# Patient Record
Sex: Male | Born: 1955
Health system: Southern US, Community
[De-identification: ages and names within clinical notes are randomized; demographics above are authoritative.]

## PROBLEM LIST (undated history)

## (undated) DIAGNOSIS — N189 Chronic kidney disease, unspecified: Secondary | ICD-10-CM

## (undated) DIAGNOSIS — M1990 Unspecified osteoarthritis, unspecified site: Secondary | ICD-10-CM

## (undated) DIAGNOSIS — R296 Repeated falls: Secondary | ICD-10-CM

## (undated) DIAGNOSIS — E119 Type 2 diabetes mellitus without complications: Secondary | ICD-10-CM

## (undated) DIAGNOSIS — D849 Immunodeficiency, unspecified: Secondary | ICD-10-CM

## (undated) DIAGNOSIS — Z794 Long term (current) use of insulin: Secondary | ICD-10-CM

## (undated) DIAGNOSIS — K219 Gastro-esophageal reflux disease without esophagitis: Secondary | ICD-10-CM

## (undated) DIAGNOSIS — I160 Hypertensive urgency: Secondary | ICD-10-CM

## (undated) DIAGNOSIS — I639 Cerebral infarction, unspecified: Secondary | ICD-10-CM

## (undated) DIAGNOSIS — B20 Human immunodeficiency virus [HIV] disease: Secondary | ICD-10-CM

## (undated) DIAGNOSIS — M5126 Other intervertebral disc displacement, lumbar region: Secondary | ICD-10-CM

## (undated) DIAGNOSIS — Z21 Asymptomatic human immunodeficiency virus [HIV] infection status: Secondary | ICD-10-CM

## (undated) DIAGNOSIS — G40909 Epilepsy, unspecified, not intractable, without status epilepticus: Secondary | ICD-10-CM

## (undated) DIAGNOSIS — E1140 Type 2 diabetes mellitus with diabetic neuropathy, unspecified: Secondary | ICD-10-CM

## (undated) DIAGNOSIS — I10 Essential (primary) hypertension: Secondary | ICD-10-CM

## (undated) DIAGNOSIS — G43909 Migraine, unspecified, not intractable, without status migrainosus: Secondary | ICD-10-CM

## (undated) DIAGNOSIS — G0490 Encephalitis and encephalomyelitis, unspecified: Secondary | ICD-10-CM

## (undated) HISTORY — DX: Type 2 diabetes mellitus without complications: E11.9

## (undated) HISTORY — PX: AV FISTULA PLACEMENT: SHX1204

## (undated) HISTORY — PX: IR FLUORO GUIDE CV LINE RIGHT: IMG2283

## (undated) HISTORY — DX: Encephalitis and encephalomyelitis, unspecified: G04.90

## (undated) HISTORY — DX: Type 2 diabetes mellitus without complications: Z79.4

## (undated) HISTORY — PX: IR US GUIDE VASC ACCESS RIGHT: IMG2390

## (undated) HISTORY — DX: Hypertensive urgency: I16.0

## (undated) HISTORY — PX: IR REMOVAL TUN CV CATH W/O FL: IMG2289

## (undated) HISTORY — DX: Type 2 diabetes mellitus with diabetic neuropathy, unspecified: E11.40

## (undated) HISTORY — PX: KNEE ARTHROSCOPY: SHX127

---

## 1998-03-28 ENCOUNTER — Emergency Department (HOSPITAL_COMMUNITY): Admission: EM | Admit: 1998-03-28 | Discharge: 1998-03-28 | Payer: Self-pay | Admitting: Emergency Medicine

## 1998-05-09 ENCOUNTER — Observation Stay (HOSPITAL_COMMUNITY): Admission: EM | Admit: 1998-05-09 | Discharge: 1998-05-10 | Payer: Self-pay | Admitting: Emergency Medicine

## 1999-02-13 ENCOUNTER — Emergency Department (HOSPITAL_COMMUNITY): Admission: EM | Admit: 1999-02-13 | Discharge: 1999-02-13 | Payer: Self-pay | Admitting: Emergency Medicine

## 1999-04-05 ENCOUNTER — Emergency Department (HOSPITAL_COMMUNITY): Admission: EM | Admit: 1999-04-05 | Discharge: 1999-04-05 | Payer: Self-pay | Admitting: Emergency Medicine

## 1999-04-24 ENCOUNTER — Emergency Department (HOSPITAL_COMMUNITY): Admission: EM | Admit: 1999-04-24 | Discharge: 1999-04-24 | Payer: Self-pay | Admitting: Emergency Medicine

## 1999-04-30 ENCOUNTER — Emergency Department (HOSPITAL_COMMUNITY): Admission: EM | Admit: 1999-04-30 | Discharge: 1999-04-30 | Payer: Self-pay | Admitting: *Deleted

## 1999-06-26 ENCOUNTER — Inpatient Hospital Stay (HOSPITAL_COMMUNITY): Admission: AD | Admit: 1999-06-26 | Discharge: 1999-06-28 | Payer: Self-pay | Admitting: Internal Medicine

## 1999-08-21 ENCOUNTER — Emergency Department (HOSPITAL_COMMUNITY): Admission: EM | Admit: 1999-08-21 | Discharge: 1999-08-21 | Payer: Self-pay | Admitting: Emergency Medicine

## 1999-10-30 ENCOUNTER — Emergency Department (HOSPITAL_COMMUNITY): Admission: EM | Admit: 1999-10-30 | Discharge: 1999-10-30 | Payer: Self-pay | Admitting: Emergency Medicine

## 1999-12-30 ENCOUNTER — Inpatient Hospital Stay (HOSPITAL_COMMUNITY): Admission: EM | Admit: 1999-12-30 | Discharge: 1999-12-31 | Payer: Self-pay | Admitting: *Deleted

## 2000-04-02 ENCOUNTER — Encounter: Payer: Self-pay | Admitting: Internal Medicine

## 2000-04-02 ENCOUNTER — Emergency Department (HOSPITAL_COMMUNITY): Admission: EM | Admit: 2000-04-02 | Discharge: 2000-04-02 | Payer: Self-pay | Admitting: Internal Medicine

## 2000-05-12 ENCOUNTER — Emergency Department (HOSPITAL_COMMUNITY): Admission: EM | Admit: 2000-05-12 | Discharge: 2000-05-12 | Payer: Self-pay | Admitting: Emergency Medicine

## 2000-07-06 ENCOUNTER — Emergency Department (HOSPITAL_COMMUNITY): Admission: EM | Admit: 2000-07-06 | Discharge: 2000-07-06 | Payer: Self-pay | Admitting: Emergency Medicine

## 2000-12-22 ENCOUNTER — Emergency Department (HOSPITAL_COMMUNITY): Admission: EM | Admit: 2000-12-22 | Discharge: 2000-12-22 | Payer: Self-pay | Admitting: Emergency Medicine

## 2000-12-22 ENCOUNTER — Encounter: Payer: Self-pay | Admitting: Emergency Medicine

## 2001-06-09 ENCOUNTER — Emergency Department (HOSPITAL_COMMUNITY): Admission: EM | Admit: 2001-06-09 | Discharge: 2001-06-09 | Payer: Self-pay | Admitting: Emergency Medicine

## 2001-06-09 ENCOUNTER — Encounter: Payer: Self-pay | Admitting: Emergency Medicine

## 2001-06-13 ENCOUNTER — Emergency Department (HOSPITAL_COMMUNITY): Admission: EM | Admit: 2001-06-13 | Discharge: 2001-06-13 | Payer: Self-pay

## 2001-08-17 ENCOUNTER — Emergency Department (HOSPITAL_COMMUNITY): Admission: EM | Admit: 2001-08-17 | Discharge: 2001-08-17 | Payer: Self-pay | Admitting: Emergency Medicine

## 2001-08-18 ENCOUNTER — Encounter: Payer: Self-pay | Admitting: Emergency Medicine

## 2001-08-18 ENCOUNTER — Emergency Department (HOSPITAL_COMMUNITY): Admission: EM | Admit: 2001-08-18 | Discharge: 2001-08-18 | Payer: Self-pay | Admitting: Emergency Medicine

## 2001-09-17 ENCOUNTER — Emergency Department (HOSPITAL_COMMUNITY): Admission: EM | Admit: 2001-09-17 | Discharge: 2001-09-17 | Payer: Self-pay | Admitting: Emergency Medicine

## 2001-09-24 ENCOUNTER — Emergency Department (HOSPITAL_COMMUNITY): Admission: EM | Admit: 2001-09-24 | Discharge: 2001-09-24 | Payer: Self-pay | Admitting: Emergency Medicine

## 2002-01-09 ENCOUNTER — Emergency Department (HOSPITAL_COMMUNITY): Admission: EM | Admit: 2002-01-09 | Discharge: 2002-01-09 | Payer: Self-pay | Admitting: Emergency Medicine

## 2002-02-07 ENCOUNTER — Emergency Department (HOSPITAL_COMMUNITY): Admission: EM | Admit: 2002-02-07 | Discharge: 2002-02-07 | Payer: Self-pay | Admitting: Emergency Medicine

## 2002-02-16 ENCOUNTER — Encounter: Payer: Self-pay | Admitting: Emergency Medicine

## 2002-02-16 ENCOUNTER — Emergency Department (HOSPITAL_COMMUNITY): Admission: EM | Admit: 2002-02-16 | Discharge: 2002-02-16 | Payer: Self-pay | Admitting: Emergency Medicine

## 2002-06-26 ENCOUNTER — Encounter: Payer: Self-pay | Admitting: *Deleted

## 2002-06-26 ENCOUNTER — Emergency Department (HOSPITAL_COMMUNITY): Admission: EM | Admit: 2002-06-26 | Discharge: 2002-06-26 | Payer: Self-pay | Admitting: Emergency Medicine

## 2002-09-29 ENCOUNTER — Emergency Department (HOSPITAL_COMMUNITY): Admission: EM | Admit: 2002-09-29 | Discharge: 2002-09-30 | Payer: Self-pay | Admitting: Emergency Medicine

## 2002-10-12 ENCOUNTER — Encounter: Payer: Self-pay | Admitting: Emergency Medicine

## 2002-10-12 ENCOUNTER — Emergency Department (HOSPITAL_COMMUNITY): Admission: EM | Admit: 2002-10-12 | Discharge: 2002-10-12 | Payer: Self-pay | Admitting: Emergency Medicine

## 2002-12-10 ENCOUNTER — Encounter: Payer: Self-pay | Admitting: Emergency Medicine

## 2002-12-10 ENCOUNTER — Emergency Department (HOSPITAL_COMMUNITY): Admission: EM | Admit: 2002-12-10 | Discharge: 2002-12-10 | Payer: Self-pay | Admitting: Emergency Medicine

## 2003-06-29 ENCOUNTER — Emergency Department (HOSPITAL_COMMUNITY): Admission: EM | Admit: 2003-06-29 | Discharge: 2003-06-29 | Payer: Self-pay | Admitting: Emergency Medicine

## 2003-06-29 ENCOUNTER — Encounter: Payer: Self-pay | Admitting: Emergency Medicine

## 2003-09-21 ENCOUNTER — Ambulatory Visit (HOSPITAL_COMMUNITY): Admission: RE | Admit: 2003-09-21 | Discharge: 2003-09-21 | Payer: Self-pay | Admitting: Internal Medicine

## 2003-10-05 ENCOUNTER — Ambulatory Visit (HOSPITAL_COMMUNITY): Admission: RE | Admit: 2003-10-05 | Discharge: 2003-10-05 | Payer: Self-pay | Admitting: Family Medicine

## 2004-02-13 ENCOUNTER — Emergency Department (HOSPITAL_COMMUNITY): Admission: EM | Admit: 2004-02-13 | Discharge: 2004-02-13 | Payer: Self-pay | Admitting: *Deleted

## 2004-05-17 ENCOUNTER — Emergency Department (HOSPITAL_COMMUNITY): Admission: EM | Admit: 2004-05-17 | Discharge: 2004-05-18 | Payer: Self-pay | Admitting: Emergency Medicine

## 2004-06-09 ENCOUNTER — Emergency Department (HOSPITAL_COMMUNITY): Admission: EM | Admit: 2004-06-09 | Discharge: 2004-06-09 | Payer: Self-pay | Admitting: Emergency Medicine

## 2004-10-21 ENCOUNTER — Ambulatory Visit: Payer: Self-pay | Admitting: Family Medicine

## 2004-10-21 ENCOUNTER — Ambulatory Visit: Payer: Self-pay | Admitting: *Deleted

## 2007-11-04 ENCOUNTER — Encounter (INDEPENDENT_AMBULATORY_CARE_PROVIDER_SITE_OTHER): Payer: Self-pay | Admitting: Family Medicine

## 2009-04-09 ENCOUNTER — Emergency Department (HOSPITAL_COMMUNITY): Admission: EM | Admit: 2009-04-09 | Discharge: 2009-04-10 | Payer: Self-pay | Admitting: Emergency Medicine

## 2009-04-13 ENCOUNTER — Emergency Department (HOSPITAL_COMMUNITY): Admission: EM | Admit: 2009-04-13 | Discharge: 2009-04-14 | Payer: Self-pay | Admitting: Emergency Medicine

## 2009-06-18 ENCOUNTER — Emergency Department (HOSPITAL_COMMUNITY): Admission: EM | Admit: 2009-06-18 | Discharge: 2009-06-18 | Payer: Self-pay | Admitting: Emergency Medicine

## 2009-09-19 ENCOUNTER — Emergency Department (HOSPITAL_BASED_OUTPATIENT_CLINIC_OR_DEPARTMENT_OTHER): Admission: EM | Admit: 2009-09-19 | Discharge: 2009-09-19 | Payer: Self-pay | Admitting: Emergency Medicine

## 2009-10-28 ENCOUNTER — Emergency Department (HOSPITAL_BASED_OUTPATIENT_CLINIC_OR_DEPARTMENT_OTHER): Admission: EM | Admit: 2009-10-28 | Discharge: 2009-10-29 | Payer: Self-pay | Admitting: Emergency Medicine

## 2009-10-29 ENCOUNTER — Ambulatory Visit: Payer: Self-pay | Admitting: Diagnostic Radiology

## 2009-11-20 ENCOUNTER — Emergency Department (HOSPITAL_COMMUNITY): Admission: EM | Admit: 2009-11-20 | Discharge: 2009-11-21 | Payer: Self-pay | Admitting: Emergency Medicine

## 2010-01-06 ENCOUNTER — Emergency Department (HOSPITAL_COMMUNITY): Admission: EM | Admit: 2010-01-06 | Discharge: 2010-01-07 | Payer: Self-pay | Admitting: Emergency Medicine

## 2010-02-22 ENCOUNTER — Emergency Department (HOSPITAL_COMMUNITY): Admission: EM | Admit: 2010-02-22 | Discharge: 2010-02-22 | Payer: Self-pay | Admitting: Emergency Medicine

## 2010-03-08 ENCOUNTER — Emergency Department (HOSPITAL_BASED_OUTPATIENT_CLINIC_OR_DEPARTMENT_OTHER): Admission: EM | Admit: 2010-03-08 | Discharge: 2010-03-08 | Payer: Self-pay | Admitting: Emergency Medicine

## 2010-04-08 ENCOUNTER — Emergency Department (HOSPITAL_COMMUNITY): Admission: EM | Admit: 2010-04-08 | Discharge: 2010-04-08 | Payer: Self-pay | Admitting: Emergency Medicine

## 2010-04-09 ENCOUNTER — Emergency Department (HOSPITAL_COMMUNITY): Admission: EM | Admit: 2010-04-09 | Discharge: 2010-04-09 | Payer: Self-pay | Admitting: Emergency Medicine

## 2010-08-01 ENCOUNTER — Ambulatory Visit: Payer: Self-pay | Admitting: Diagnostic Radiology

## 2010-08-01 ENCOUNTER — Emergency Department (HOSPITAL_BASED_OUTPATIENT_CLINIC_OR_DEPARTMENT_OTHER): Admission: EM | Admit: 2010-08-01 | Discharge: 2010-08-01 | Payer: Self-pay | Admitting: Emergency Medicine

## 2010-08-12 ENCOUNTER — Emergency Department (HOSPITAL_BASED_OUTPATIENT_CLINIC_OR_DEPARTMENT_OTHER): Admission: EM | Admit: 2010-08-12 | Discharge: 2010-08-12 | Payer: Self-pay | Admitting: Emergency Medicine

## 2010-12-03 LAB — DIFFERENTIAL
Basophils Absolute: 0.2 10*3/uL — ABNORMAL HIGH (ref 0.0–0.1)
Eosinophils Relative: 2 % (ref 0–5)
Lymphocytes Relative: 25 % (ref 12–46)
Lymphs Abs: 1.9 10*3/uL (ref 0.7–4.0)
Neutro Abs: 4.7 10*3/uL (ref 1.7–7.7)

## 2010-12-03 LAB — POCT CARDIAC MARKERS
CKMB, poc: <1 ng/mL — ABNORMAL LOW (ref 1.0–8.0)
Myoglobin, poc: 75.8 ng/mL (ref 12–200)
Troponin i, poc: <0.05 ng/mL (ref 0.00–0.09)

## 2010-12-03 LAB — COMPREHENSIVE METABOLIC PANEL
AST: 23 U/L (ref 0–37)
Albumin: 5 g/dL (ref 3.5–5.2)
BUN: 11 mg/dL (ref 6–23)
CO2: 29 mEq/L (ref 19–32)
Calcium: 10.2 mg/dL (ref 8.4–10.5)
Creatinine, Ser: 1.1 mg/dL (ref 0.4–1.5)
GFR calc Af Amer: >60 mL/min (ref >60)
GFR calc non Af Amer: >60 mL/min (ref >60)
Total Bilirubin: 0.6 mg/dL (ref 0.3–1.2)

## 2010-12-03 LAB — CBC
MCH: 32.2 pg (ref 26.0–34.0)
MCHC: 34.8 g/dL (ref 30.0–36.0)
MCV: 92.7 fL (ref 78.0–100.0)
Platelets: 230 10*3/uL (ref 150–400)

## 2010-12-03 LAB — LIPASE, BLOOD: Lipase: 62 U/L (ref 23–300)

## 2010-12-09 LAB — COMPREHENSIVE METABOLIC PANEL
Albumin: 3.7 g/dL (ref 3.5–5.2)
BUN: 12 mg/dL (ref 6–23)
Calcium: 9.2 mg/dL (ref 8.4–10.5)
Creatinine, Ser: 1.18 mg/dL (ref 0.4–1.5)
Glucose, Bld: 211 mg/dL — ABNORMAL HIGH (ref 70–99)
Potassium: 3.6 mEq/L (ref 3.5–5.1)
Total Protein: 7.1 g/dL (ref 6.0–8.3)

## 2010-12-10 LAB — CBC
HCT: 42.4 % (ref 39.0–52.0)
Hemoglobin: 14.8 g/dL (ref 13.0–17.0)
MCHC: 34.9 g/dL (ref 30.0–36.0)
MCV: 91.3 fL (ref 78.0–100.0)
RBC: 4.64 MIL/uL (ref 4.22–5.81)
RDW: 12.5 % (ref 11.5–15.5)

## 2010-12-10 LAB — GLUCOSE, CAPILLARY: Glucose-Capillary: 294 mg/dL — ABNORMAL HIGH (ref 70–99)

## 2010-12-10 LAB — DIFFERENTIAL
Basophils Relative: 0 % (ref 0–1)
Eosinophils Relative: 1 % (ref 0–5)
Monocytes Absolute: 0.3 10*3/uL (ref 0.1–1.0)
Monocytes Relative: 4 % (ref 3–12)
Neutro Abs: 4.8 10*3/uL (ref 1.7–7.7)

## 2010-12-10 LAB — URINALYSIS, ROUTINE W REFLEX MICROSCOPIC
Ketones, ur: NEGATIVE mg/dL
Specific Gravity, Urine: 1.046 — ABNORMAL HIGH (ref 1.005–1.030)
Urobilinogen, UA: 1 mg/dL (ref 0.0–1.0)

## 2010-12-10 LAB — URINE MICROSCOPIC-ADD ON

## 2010-12-10 LAB — BASIC METABOLIC PANEL
CO2: 30 mEq/L (ref 19–32)
Calcium: 9.1 mg/dL (ref 8.4–10.5)
Chloride: 100 mEq/L (ref 96–112)
GFR calc Af Amer: >60 mL/min (ref >60)
Glucose, Bld: 287 mg/dL — ABNORMAL HIGH (ref 70–99)
Potassium: 3 mEq/L — ABNORMAL LOW (ref 3.5–5.1)
Sodium: 136 mEq/L (ref 135–145)

## 2010-12-12 LAB — DIFFERENTIAL
Basophils Absolute: 0.2 10*3/uL — ABNORMAL HIGH (ref 0.0–0.1)
Basophils Relative: 3 % — ABNORMAL HIGH (ref 0–1)
Eosinophils Absolute: 0.1 10*3/uL (ref 0.0–0.7)
Neutro Abs: 4.2 10*3/uL (ref 1.7–7.7)
Neutrophils Relative %: 51 % (ref 43–77)

## 2010-12-12 LAB — BASIC METABOLIC PANEL
Calcium: 8.3 mg/dL — ABNORMAL LOW (ref 8.4–10.5)
GFR calc Af Amer: >60 mL/min (ref >60)
GFR calc non Af Amer: >60 mL/min (ref >60)
Sodium: 139 mEq/L (ref 135–145)

## 2010-12-12 LAB — CBC
MCHC: 35.3 g/dL (ref 30.0–36.0)
MCV: 90.9 fL (ref 78.0–100.0)
Platelets: 209 10*3/uL (ref 150–400)

## 2010-12-16 LAB — LIPASE, BLOOD: Lipase: 33 U/L (ref 11–59)

## 2010-12-16 LAB — COMPREHENSIVE METABOLIC PANEL
ALT: 11 U/L (ref 0–53)
Alkaline Phosphatase: 92 U/L (ref 39–117)
CO2: 33 mEq/L — ABNORMAL HIGH (ref 19–32)
Chloride: 96 mEq/L (ref 96–112)
Glucose, Bld: 204 mg/dL — ABNORMAL HIGH (ref 70–99)
Potassium: 2.7 mEq/L — CL (ref 3.5–5.1)
Sodium: 136 mEq/L (ref 135–145)
Total Protein: 6.8 g/dL (ref 6.0–8.3)

## 2010-12-16 LAB — CBC
Hemoglobin: 13.7 g/dL (ref 13.0–17.0)
RBC: 4.38 MIL/uL (ref 4.22–5.81)
RDW: 13.7 % (ref 11.5–15.5)
WBC: 8.4 10*3/uL (ref 4.0–10.5)

## 2010-12-16 LAB — URINALYSIS, ROUTINE W REFLEX MICROSCOPIC
Bilirubin Urine: NEGATIVE
Glucose, UA: 100 mg/dL — AB
Ketones, ur: NEGATIVE mg/dL
pH: 7 (ref 5.0–8.0)

## 2010-12-16 LAB — DIFFERENTIAL
Basophils Relative: 0 % (ref 0–1)
Eosinophils Absolute: 0.1 10*3/uL (ref 0.0–0.7)
Monocytes Absolute: 0.4 10*3/uL (ref 0.1–1.0)
Neutrophils Relative %: 66 % (ref 43–77)

## 2010-12-23 LAB — COMPREHENSIVE METABOLIC PANEL
CO2: 29 mEq/L (ref 19–32)
Calcium: 9.5 mg/dL (ref 8.4–10.5)
Creatinine, Ser: 1.1 mg/dL (ref 0.4–1.5)
GFR calc non Af Amer: >60 mL/min (ref >60)
Glucose, Bld: 688 mg/dL (ref 70–99)

## 2010-12-23 LAB — CBC
HCT: 45.8 % (ref 39.0–52.0)
Hemoglobin: 16.1 g/dL (ref 13.0–17.0)
MCHC: 35.1 g/dL (ref 30.0–36.0)
MCV: 89.1 fL (ref 78.0–100.0)
RBC: 5.14 MIL/uL (ref 4.22–5.81)

## 2010-12-23 LAB — DIFFERENTIAL
Basophils Absolute: 0.1 10*3/uL (ref 0.0–0.1)
Eosinophils Absolute: 0 10*3/uL (ref 0.0–0.7)
Lymphocytes Relative: 24 % (ref 12–46)
Lymphs Abs: 1.6 10*3/uL (ref 0.7–4.0)
Neutrophils Relative %: 69 % (ref 43–77)

## 2010-12-23 LAB — URINALYSIS, ROUTINE W REFLEX MICROSCOPIC
Bilirubin Urine: NEGATIVE
Ketones, ur: NEGATIVE mg/dL
Nitrite: NEGATIVE
Protein, ur: NEGATIVE mg/dL
Specific Gravity, Urine: 1.036 — ABNORMAL HIGH (ref 1.005–1.030)
Urobilinogen, UA: 0.2 mg/dL (ref 0.0–1.0)

## 2010-12-23 LAB — GLUCOSE, CAPILLARY

## 2010-12-23 LAB — LIPASE, BLOOD: Lipase: 186 U/L (ref 23–300)

## 2010-12-27 LAB — POCT I-STAT, CHEM 8
BUN: 6 mg/dL (ref 6–23)
Creatinine, Ser: 1 mg/dL (ref 0.4–1.5)
Hemoglobin: 15.6 g/dL (ref 13.0–17.0)
Potassium: 3.6 mEq/L (ref 3.5–5.1)
Sodium: 134 mEq/L — ABNORMAL LOW (ref 135–145)

## 2010-12-27 LAB — GLUCOSE, CAPILLARY
Glucose-Capillary: 312 mg/dL — ABNORMAL HIGH (ref 70–99)
Glucose-Capillary: 341 mg/dL — ABNORMAL HIGH (ref 70–99)

## 2010-12-27 LAB — CBC
HCT: 44.7 % (ref 39.0–52.0)
MCV: 90 fL (ref 78.0–100.0)
RBC: 4.96 MIL/uL (ref 4.22–5.81)
WBC: 5.9 10*3/uL (ref 4.0–10.5)

## 2010-12-27 LAB — PROTIME-INR: INR: 0.9 (ref 0.00–1.49)

## 2010-12-29 LAB — URINALYSIS, ROUTINE W REFLEX MICROSCOPIC
Leukocytes, UA: NEGATIVE
Nitrite: NEGATIVE
Specific Gravity, Urine: 1.042 — ABNORMAL HIGH (ref 1.005–1.030)
Urobilinogen, UA: 0.2 mg/dL (ref 0.0–1.0)
pH: 5.5 (ref 5.0–8.0)

## 2010-12-29 LAB — URINE MICROSCOPIC-ADD ON

## 2010-12-29 LAB — POCT I-STAT, CHEM 8
Chloride: 92 mEq/L — ABNORMAL LOW (ref 96–112)
Glucose, Bld: 409 mg/dL — ABNORMAL HIGH (ref 70–99)
HCT: 51 % (ref 39.0–52.0)
Hemoglobin: 17.3 g/dL — ABNORMAL HIGH (ref 13.0–17.0)
Potassium: 2.9 mEq/L — ABNORMAL LOW (ref 3.5–5.1)
Sodium: 137 mEq/L (ref 135–145)

## 2010-12-29 LAB — GLUCOSE, CAPILLARY: Glucose-Capillary: 201 mg/dL — ABNORMAL HIGH (ref 70–99)

## 2010-12-29 LAB — DIFFERENTIAL
Basophils Absolute: 0 10*3/uL (ref 0.0–0.1)
Eosinophils Absolute: 0 10*3/uL (ref 0.0–0.7)
Lymphocytes Relative: 17 % (ref 12–46)
Monocytes Relative: 13 % — ABNORMAL HIGH (ref 3–12)
Neutro Abs: 3.2 10*3/uL (ref 1.7–7.7)
Neutrophils Relative %: 69 % (ref 43–77)

## 2010-12-29 LAB — CBC
Hemoglobin: 16.8 g/dL (ref 13.0–17.0)
MCHC: 33.9 g/dL (ref 30.0–36.0)
RBC: 5.52 MIL/uL (ref 4.22–5.81)
RDW: 13.6 % (ref 11.5–15.5)

## 2011-06-21 ENCOUNTER — Inpatient Hospital Stay (HOSPITAL_COMMUNITY)
Admission: RE | Admit: 2011-06-21 | Discharge: 2011-06-23 | Disposition: A | Discharge status: Home / Self Care | Payer: Self-pay | Source: Ambulatory Visit | Attending: Cardiology | Admitting: Cardiology

## 2011-06-21 ENCOUNTER — Emergency Department (HOSPITAL_COMMUNITY): Payer: Self-pay

## 2011-06-21 DIAGNOSIS — F17200 Nicotine dependence, unspecified, uncomplicated: Secondary | ICD-10-CM | POA: Diagnosis present

## 2011-06-21 DIAGNOSIS — R0789 Other chest pain: Principal | ICD-10-CM | POA: Diagnosis present

## 2011-06-21 DIAGNOSIS — E785 Hyperlipidemia, unspecified: Secondary | ICD-10-CM | POA: Diagnosis present

## 2011-06-21 DIAGNOSIS — E119 Type 2 diabetes mellitus without complications: Secondary | ICD-10-CM | POA: Diagnosis present

## 2011-06-21 DIAGNOSIS — K219 Gastro-esophageal reflux disease without esophagitis: Secondary | ICD-10-CM | POA: Diagnosis present

## 2011-06-21 DIAGNOSIS — Z794 Long term (current) use of insulin: Secondary | ICD-10-CM

## 2011-06-21 DIAGNOSIS — I10 Essential (primary) hypertension: Secondary | ICD-10-CM | POA: Diagnosis present

## 2011-06-21 DIAGNOSIS — E876 Hypokalemia: Secondary | ICD-10-CM | POA: Diagnosis present

## 2011-06-21 DIAGNOSIS — I2510 Atherosclerotic heart disease of native coronary artery without angina pectoris: Secondary | ICD-10-CM | POA: Diagnosis present

## 2011-06-21 LAB — COMPREHENSIVE METABOLIC PANEL
Albumin: 3.1 g/dL — ABNORMAL LOW (ref 3.5–5.2)
BUN: 12 mg/dL (ref 6–23)
Calcium: 9 mg/dL (ref 8.4–10.5)
GFR calc Af Amer: >60 mL/min (ref >60)
Glucose, Bld: 92 mg/dL (ref 70–99)
Sodium: 139 mEq/L (ref 135–145)
Total Protein: 6.6 g/dL (ref 6.0–8.3)

## 2011-06-21 LAB — LIPASE, BLOOD: Lipase: 21 U/L (ref 11–59)

## 2011-06-21 LAB — DIFFERENTIAL
Basophils Absolute: 0 10*3/uL (ref 0.0–0.1)
Basophils Relative: 0 % (ref 0–1)
Eosinophils Absolute: 0.1 10*3/uL (ref 0.0–0.7)
Monocytes Relative: 9 % (ref 3–12)
Neutro Abs: 3.4 10*3/uL (ref 1.7–7.7)
Neutrophils Relative %: 56 % (ref 43–77)

## 2011-06-21 LAB — CBC
MCH: 31.3 pg (ref 26.0–34.0)
MCHC: 36.3 g/dL — ABNORMAL HIGH (ref 30.0–36.0)
Platelets: 188 10*3/uL (ref 150–400)
RBC: 4.31 MIL/uL (ref 4.22–5.81)

## 2011-06-21 LAB — CK TOTAL AND CKMB (NOT AT ARMC)
CK, MB: 1.9 ng/mL (ref 0.3–4.0)
Relative Index: INVALID (ref 0.0–2.5)
Total CK: 68 U/L (ref 7–232)

## 2011-06-21 LAB — PROTIME-INR
INR: 0.92 (ref 0.00–1.49)
Prothrombin Time: 12.6 seconds (ref 11.6–15.2)

## 2011-06-22 DIAGNOSIS — R079 Chest pain, unspecified: Secondary | ICD-10-CM

## 2011-06-22 LAB — CBC
HCT: 34.4 % — ABNORMAL LOW (ref 39.0–52.0)
Hemoglobin: 12.5 g/dL — ABNORMAL LOW (ref 13.0–17.0)
MCH: 31.3 pg (ref 26.0–34.0)
MCHC: 36.3 g/dL — ABNORMAL HIGH (ref 30.0–36.0)
MCV: 86 fL (ref 78.0–100.0)

## 2011-06-22 LAB — CK TOTAL AND CKMB (NOT AT ARMC): CK, MB: 1.6 ng/mL (ref 0.3–4.0)

## 2011-06-22 LAB — GLUCOSE, CAPILLARY
Glucose-Capillary: 108 mg/dL — ABNORMAL HIGH (ref 70–99)
Glucose-Capillary: 68 mg/dL — ABNORMAL LOW (ref 70–99)
Glucose-Capillary: 89 mg/dL (ref 70–99)
Glucose-Capillary: 76 mg/dL (ref 70–99)

## 2011-06-22 LAB — HEPARIN LEVEL (UNFRACTIONATED): Heparin Unfractionated: 0.21 IU/mL — ABNORMAL LOW (ref 0.30–0.70)

## 2011-06-22 LAB — LIPID PANEL
Cholesterol: 168 mg/dL (ref 0–200)
HDL: 56 mg/dL (ref >39)
Total CHOL/HDL Ratio: 3 RATIO
VLDL: 9 mg/dL (ref 0–40)

## 2011-06-22 MED ORDER — IOHEXOL 350 MG/ML SOLN
100.0000 mL | Freq: Once | INTRAVENOUS | Status: AC | PRN
  Administered 2011-06-22: 100 mL via INTRAVENOUS

## 2011-06-22 NOTE — H&P (Signed)
Richard [name], Richard [name]             ACCOUNT NO.:  [id number]  MEDICAL RECORD NO.:  [id number]  LOCATION:  WLED                         FACILITY:  [facility]  PHYSICIAN:  Fransico Him, M.D.     DATE OF BIRTH:  [birth year]  DATE OF ADMISSION:  06/21/2011 DATE OF DISCHARGE:                             HISTORY & PHYSICAL   REFERRING PHYSICIAN:  Dr. Jeanell Sparrow in the ER.  CHIEF COMPLAINT:  Chest pain.  HISTORY OF PRESENT ILLNESS:  This is a 55 year old male with a history of hypertension, GERD, diabetes mellitus who presented to the emergency with complaints of chest pain.  He says the chest pain started yesterday about 90 minutes after eating breakfast.  The pain was at 10/10 and was constant, but then became off and on throughout yesterday.  The pain became worse again today and he presented to the emergency room.  He describes the pain as a sharp stabbing pain, midsternal with radiation to the shoulder blades and neck.  He denies any shortness of breath, but did have nausea and diaphoresis.  Currently pain is 5/10.  Pain became constant this morning and has been constant since then.  PAST MEDICAL HISTORY:  Includes diabetes mellitus, fibromyalgia, hypertension, erectile dysfunction, GERD, gastroparesis, migraine headaches, arthritis, chronic back pain.  PAST SURGICAL HISTORY:  Bilateral knee surgery.  ALLERGIES:  None.  SOCIAL HISTORY:  He is separated.  He has 3 children.  He smokes one half-pack of cigarettes daily from his teen years.  He denies any alcohol or IV drug use.  FAMILY HISTORY:  His mother is alive and well.  His father died of CVA. He has two brothers and three sisters, none with CAD.  REVIEW OF SYSTEMS:  Otherwise stated in HPI is negative.  MEDICATIONS:  Include Accupril  40 mg b.i.d., Actos 30 mg daily, aspirin 81 mg daily, Celebrex 200 mg b.i.d., glipizide 10 mg b.i.d., hydrochlorothiazide 25 mg daily, Lantus insulin 40 units subcu daily, Lortab 7.5/500 one tablet  t.i.d. p.r.n., Lyrica 150 mg daily.  Metformin 1000 mg b.i.d., metoclopramide 5 mg q.i.d. p.r.n., Norvasc 10 mg daily, Protonix 40 mg b.i.d., Tekturna 300 mg daily, Viagra and Zantac.  PHYSICAL EXAMINATION:  VITAL SIGNS:  Blood pressure is 171/104, heart rate 72, he is a well-developed, well-nourished black male in no acute distress. HEENT:  Benign. NECK:  Supple without lymphadenopathy.  Carotid upstrokes +2, bilaterally no bruits. LUNGS:  Clear to auscultation throughout. HEART:  Regular rate and rhythm.  No murmurs, rubs, or gallops.  Normal S1 and S2. ABDOMEN:  Soft, nontender, and nondistended.  Normoactive bowel sounds. No hepatosplenomegaly. EXTREMITIES:  No cyanosis, erythema, or edema, +2 dorsalis pedis pulses bilaterally.  LABORATORY DATA:  Sodium 139, potassium 3.4, chloride 102, bicarb 30, BUN 12, creatinine 1.22.  White cell count 6.1, hemoglobin 13.5, hematocrit 37.2, platelet count 180.  LFTs are normal.  Lipase 21.  CPK 68, MB 1.9, troponin less than 0.30.  EKG shows sinus rhythm with anterior MI, questionable age LVH by voltage with repolarization T-wave abnormalities in V6 and inferiorly.  Chest x-ray shows no active disease.  ASSESSMENT: 1. Unstable angina pectoris with negative enzymes x1.  EKG shows T-  wave inversion inferiorly.  But he does have LVH and this could be     repolarization abnormality. 2. Hypertensive urgency. 3. Diabetes mellitus. 4. Gastroesophageal reflux disease.  PLAN:  Admit to telemetry bed.  We will get a chest CT with angio to rule out aortic dissection because of his sharp pain that goes into his back and hypertensive urgency.  We will start IV heparin drip if chest CT negative for aortic dissection.  We will start aspirin 325 mg daily. IV nitroglycerin drip to titrate to keep pain free.  Of note, the patient does use Viagra but says he has not used dose of Viagra in the past month, replete potassium, check BMET in the morning.   Cycle cardiac enzymes, Lopressor 25 mg t.i.d. further workup per [facility] would probably need a cath on Monday.  We will leave final decision to [facility].  We will also hold metformin since he is getting a chest CT with contrast.     Fransico Him, M.D.     TT/MEDQ  D:  06/21/2011  T:  06/21/2011  Job:  [id number]  Electronically Signed by Fransico Him M.D. on 06/22/2011 05:15:08 PM

## 2011-06-23 ENCOUNTER — Inpatient Hospital Stay (HOSPITAL_COMMUNITY)
Admission: RE | Admit: 2011-06-23 | Discharge: 2011-06-24 | DRG: 287 | Disposition: A | Discharge status: Home / Self Care | Payer: Self-pay | Source: Ambulatory Visit | Attending: Interventional Cardiology | Admitting: Interventional Cardiology

## 2011-06-23 LAB — GLUCOSE, CAPILLARY
Glucose-Capillary: 65 mg/dL — ABNORMAL LOW (ref 70–99)
Glucose-Capillary: 139 mg/dL — ABNORMAL HIGH (ref 70–99)
Glucose-Capillary: 69 mg/dL — ABNORMAL LOW (ref 70–99)
Glucose-Capillary: 88 mg/dL (ref 70–99)

## 2011-06-23 LAB — BASIC METABOLIC PANEL
BUN: 17 mg/dL (ref 6–23)
GFR calc Af Amer: 67 mL/min — ABNORMAL LOW (ref >90)
GFR calc non Af Amer: 58 mL/min — ABNORMAL LOW (ref >90)
Potassium: 3.3 mEq/L — ABNORMAL LOW (ref 3.5–5.1)

## 2011-06-23 LAB — CBC
HCT: 36 % — ABNORMAL LOW (ref 39.0–52.0)
MCHC: 35.3 g/dL (ref 30.0–36.0)
Platelets: 182 10*3/uL (ref 150–400)
RDW: 12.8 % (ref 11.5–15.5)

## 2011-06-24 LAB — BASIC METABOLIC PANEL
Calcium: 8.5 mg/dL (ref 8.4–10.5)
Creatinine, Ser: 1.62 mg/dL — ABNORMAL HIGH (ref 0.50–1.35)
GFR calc non Af Amer: 47 mL/min — ABNORMAL LOW (ref >90)
Glucose, Bld: 130 mg/dL — ABNORMAL HIGH (ref 70–99)
Sodium: 139 mEq/L (ref 135–145)

## 2011-06-24 LAB — GLUCOSE, CAPILLARY: Glucose-Capillary: 175 mg/dL — ABNORMAL HIGH (ref 70–99)

## 2011-07-03 NOTE — Cardiovascular Report (Signed)
NAMETAEVON, SENDRA             ACCOUNT NO.:  [id number]  MEDICAL RECORD NO.:  [id number]  LOCATION:  [id number]                         FACILITY:  [name]  PHYSICIAN:  Belva Crome, M.D.   DATE OF BIRTH:  [birth year]  DATE OF PROCEDURE:  06/23/2011 DATE OF DISCHARGE:  06/23/2011                           CARDIAC CATHETERIZATION   INDICATIONS:  Recurring chest pain of uncertain cause in a patient with multiple risk factors including diabetes for 15 years, hypertension, tobacco abuse, and hyperlipidemia.  PROCEDURE PERFORMED: 1. Diagnostic coronary angiography via right radial. 2. Selective coronary angiography. 3. Left ventriculography.  DESCRIPTION:  After informed consent, a 5-French sheath was placed in the right radial using the micro puncture technique.  We then used a 5- [place] A2 multipurpose catheter to perform left ventriculography and hemodynamic recordings.  We identified with the multipurpose catheter that the left main and right coronary appeared to arise from a near single ostium.  We were unable to torque and advance the multipurpose catheter into the coronary ostia, but eventually were able to visualize the coronaries with an EBU 3.0 cm left catheter.  Selective engagement of the left main occurred.  Slight clockwise torque engaged the anomalous origin of the right coronary which appeared ultimately to arise from a separate ostium very near the left main ostium.  We were unable to determine if the course of the right coronary was between the pulmonary artery and aorta or otherwise.  My clinical suspicion in looking at the anatomy is that there is an intraaortic course between the pulmonary artery and aorta.  The patient received 4000 units of heparin after sheath placement. Intra-arterial verapamil was given.  A wristband was used for hemostasis after the procedure.  We were unable to document a reverse Allen.  No complications occurred.  RESULTS: 1.  Hemodynamic data:     a.     Aortic pressure 153/85.     b.     Left ventricular pressure 154/24. 2. Left ventriculography:  Left ventricular cavity size and function     are normal.  The ejection fraction is 60%. 3. Coronary angiography.     a.     Left main coronary:  Large and widely patent.     b.     The LAD is large and wraps around the left ventricular apex.      The midvessel is eccentric and obstructed up to 40%.  LAD is      transapical..     c.     Ramus intermedius:  Ramus intermedius is a large vessel that      arises from the distal left main, trifurcates on the lateral wall      and is widely patent.     d.     Circumflex artery:  Circumflex coronary artery gives origin      to three obtuse marginal branches.  The first obtuse marginal is      small.  The second obtuse marginal is moderate in size and      contains an ostial eccentric 75% stenosis.  The third obtuse      marginal is small and small branches supply the distal  inferolateral wall.     e.     Right coronary:  The right coronary artery arises      anomalously and very close to the left main origin.  The mid      vessel has luminal irregularities.  PDA and left ventricular      branch arises distally.  No significant obstruction is noted.  CONCLUSIONS: 1. 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  I suspect that     the right coronary courses between the pulmonary artery and the     aorta. 2. Widely patent epicardial coronary arteries with the exception of a     small obtuse marginal #1 with a 70% ostial stenosis that should be     treated medically. 3. Normal left ventricular function with EF of 60%.  PLAN:  Per Dr. Fransico Him.  Myocardial perfusion imaging to determine if there is evidence of inferior wall ischemia due to the anomalous right coronary coursing between the pulmonary artery and aorta would be a  consideration although I would certainly treat this particular situation with medical therapy given the extraordinarily large size of the left coronary system in comparison to the right coronary and the relatively small vascular territory supplied by the right.     Belva Crome, M.D.     HWS/MEDQ  D:  06/23/2011  T:  06/23/2011  Job:  [id number]  cc:   Fransico Him, M.D.  Electronically Signed by Daneen Schick M.D. on 07/03/2011 11:01:51 AM

## 2011-07-11 NOTE — Discharge Summary (Addendum)
NAMEEDISSON, PAVLOVICH             ACCOUNT NO.:  [id number]  MEDICAL RECORD NO.:  [id number]  LOCATION:  12                         FACILITY:  [facility]  PHYSICIAN:  Fransico Him, M.D.     DATE OF BIRTH:  05-11-1956  DATE OF ADMISSION:  06/21/2011 DATE OF DISCHARGE:  06/24/2011                              DISCHARGE SUMMARY   ADMISSION DIAGNOSES: 1. Unstable angina pectoris. 2. Hypertensive urgency. 3. Diabetes mellitus. 4. Gastroesophageal reflux.  DISCHARGE DIAGNOSES: 1. Noncardiac chest pain. 2. Nonobstructive coronary disease. 3. Hypertensive urgency, resolved. 4. Diabetes mellitus. 5. Gastroesophageal reflux.  PROCEDURES:  Cardiac catheterization, June 23, 2011 by Dr. Daneen Schick.  HISTORY OF PRESENT ILLNESS:  This is a 55 year old male with a history of hypertension, GERD, and diabetes who presented to the emergency room with complaints of chest pain.  His chest pain had started 90 minutes after eating breakfast on the day prior to admission.  His pain was 10/10 and constant but then became off and on throughout the day the day prior to admission.  The pain became worse on the day of admission and he presented to the emergency room.  He described the pain as a sharp, stabbing pain, midsternal, with radiation to shoulder blades and neck. Of note, he was hypertensive on arrival in the ER with a blood pressure of 171/104 mmHg.  HOSPITAL COURSE:  The patient was admitted to the hospital and started on IV heparin and IV nitro.  He underwent CT scan of the chest which showed no evidence of thoracic aneurysm and no evidence of pulmonary embolism.  He ruled out for myocardial infarction by serial cardiac enzymes.  Of note, he had issues with low blood sugars during his hospital stay and all his diabetic medications were held.  He states that at home he has a lot of problems, keeping his blood sugar at a normal range.  It keeps dropping and he has to eat constantly.   On June 23, 2011, he underwent cardiac catheterization which revealed normal LV function and size, EF 60%, patent left main coronary artery, 40% eccentric lesion in the mid LAD, patent ramus, patent circumflex with 75% ostial stenosis of a second obtuse marginal and right coronary artery had luminal irregularities.  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  It was recommended by Dr. Daneen Schick that myocardial perfusion imaging be performed as an outpatient to evaluate for inferior wall ischemia given the anomalous right coronary artery perfusion between the pulmonary artery and the aorta.  On the day of discharge, his blood sugar was 130, so his Actos was restarted.  He does have an appointment to see his primary care physician at the outpatient clinic at [facility] today which he will present for, so he can discuss his diabetic medications further.  DISCHARGE MEDICATIONS: 1. K-Dur 20 mEq daily. 2. Metoprolol 25 mg p.o. b.i.d. 3. Accupril 40 mg b.i.d. 4. Actos 30 mg daily. 5. Aspirin 81 mg daily. 6. Hydrochlorothiazide 25 mg daily. 7. Lortab 7.5/500 one tablet 3 times daily as needed. 8. Lyrica 150 mg every evening. 9. Norvasc  10 mg daily. 10.Protonix 40 mg b.i.d. 11.Tekturna 300 mg daily.  He was informed to stop his glipizide, Lantus insulin, and metformin until he is seen by his physician in the outpatient clinic at [facility] which is today at 10 o'clock.  DISCHARGE INSTRUCTIONS:  He was instructed to continue a low-sodium heart healthy diet.  He will follow up with my nurse practitioner on July 07, 2011 at 11:45 a.m. and we will schedule him for an outpatient nuclear stress test.  DISCHARGE LABORATORY DATA:  Sodium of 139, potassium 3.8, chloride 104, CO2 28, glucose 130, BUN 23, creatinine 1.6, and calcium 8.5.  White cell count 6.6, hemoglobin 12.7,  hematocrit 36, and platelet count 182. Hemoglobin A1c 6.  His total cholesterol was 168, HDL 56, and LDL 103.  ADDENDUM:  Given that the patient's LDL is 103, his LDL goal is less than 70.  Given his hypertension and nonobstructive coronary disease, we will start him on Zocor 20 mg daily and he will have a repeat fasting statin panel in our office in 6 weeks.   I have spent a total of 40 minutes in preparing d/c med list, note and discharge summary.  Fransico Him, M.D.     TT/MEDQ  D:  06/24/2011  T:  06/24/2011  Job:  [id number]  cc:   [facility]  Electronically Signed by Fransico Him M.D. on 07/11/2011 08:20:40 PM

## 2011-09-23 DIAGNOSIS — I639 Cerebral infarction, unspecified: Secondary | ICD-10-CM

## 2011-09-23 HISTORY — DX: Cerebral infarction, unspecified: I63.9

## 2011-11-16 ENCOUNTER — Emergency Department (HOSPITAL_BASED_OUTPATIENT_CLINIC_OR_DEPARTMENT_OTHER)
Admission: EM | Admit: 2011-11-16 | Discharge: 2011-11-16 | Disposition: A | Discharge status: Home Health | Payer: Self-pay | Attending: Emergency Medicine | Admitting: Emergency Medicine

## 2011-11-16 ENCOUNTER — Encounter (HOSPITAL_BASED_OUTPATIENT_CLINIC_OR_DEPARTMENT_OTHER): Payer: Self-pay | Admitting: Emergency Medicine

## 2011-11-16 ENCOUNTER — Emergency Department (INDEPENDENT_AMBULATORY_CARE_PROVIDER_SITE_OTHER): Payer: Non-veteran care

## 2011-11-16 DIAGNOSIS — W1809XA Striking against other object with subsequent fall, initial encounter: Secondary | ICD-10-CM | POA: Insufficient documentation

## 2011-11-16 DIAGNOSIS — R42 Dizziness and giddiness: Secondary | ICD-10-CM

## 2011-11-16 DIAGNOSIS — R55 Syncope and collapse: Secondary | ICD-10-CM

## 2011-11-16 DIAGNOSIS — K219 Gastro-esophageal reflux disease without esophagitis: Secondary | ICD-10-CM | POA: Insufficient documentation

## 2011-11-16 DIAGNOSIS — W19XXXA Unspecified fall, initial encounter: Secondary | ICD-10-CM

## 2011-11-16 DIAGNOSIS — E119 Type 2 diabetes mellitus without complications: Secondary | ICD-10-CM | POA: Insufficient documentation

## 2011-11-16 DIAGNOSIS — I10 Essential (primary) hypertension: Secondary | ICD-10-CM | POA: Insufficient documentation

## 2011-11-16 DIAGNOSIS — H5710 Ocular pain, unspecified eye: Secondary | ICD-10-CM

## 2011-11-16 DIAGNOSIS — F17200 Nicotine dependence, unspecified, uncomplicated: Secondary | ICD-10-CM | POA: Insufficient documentation

## 2011-11-16 DIAGNOSIS — S0590XA Unspecified injury of unspecified eye and orbit, initial encounter: Secondary | ICD-10-CM | POA: Insufficient documentation

## 2011-11-16 HISTORY — DX: Essential (primary) hypertension: I10

## 2011-11-16 HISTORY — DX: Migraine, unspecified, not intractable, without status migrainosus: G43.909

## 2011-11-16 HISTORY — DX: Gastro-esophageal reflux disease without esophagitis: K21.9

## 2011-11-16 HISTORY — DX: Unspecified osteoarthritis, unspecified site: M19.90

## 2011-11-16 HISTORY — DX: Other intervertebral disc displacement, lumbar region: M51.26

## 2011-11-16 MED ORDER — TETRACAINE HCL 0.5 % OP SOLN
OPHTHALMIC | Status: AC
  Administered 2011-11-16: 18:00:00
  Filled 2011-11-16: qty 2

## 2011-11-16 MED ORDER — FLUORESCEIN SODIUM 1 MG OP STRP
ORAL_STRIP | OPHTHALMIC | Status: AC
  Administered 2011-11-16: 18:00:00
  Filled 2011-11-16: qty 1

## 2011-11-16 MED ORDER — HYDROCODONE-ACETAMINOPHEN 5-500 MG PO TABS: 1.0000 | ORAL_TABLET | Freq: Four times a day (QID) | ORAL | Status: AC | PRN

## 2011-11-16 NOTE — ED Provider Notes (Signed)
Medical screening examination/treatment/procedure(s) were performed by non-physician practitioner and as supervising physician I was immediately available for consultation/collaboration.  Carmin Muskrat, MD 11/16/11 [phone]

## 2011-11-16 NOTE — ED Notes (Signed)
Pt c/o LT eye pain since Fri; sts he "passed out" after getting dizzy;  hit LT eye on doorknob when he fell.  Was able to get himself up & rested on the couch

## 2011-11-16 NOTE — ED Provider Notes (Signed)
History     CSN: [id number]  Arrival date & time 11/16/11  1714   First MD Initiated Contact with Patient 11/16/11 1802      Chief Complaint  Patient presents with  . Eye Injury    (Consider location/radiation/quality/duration/timing/severity/associated sxs/prior treatment) HPI Comments: Pt states that he got dizzy 2 days ago and and he fell forward and his eye hit the door knob:pt states that he didn't completely loose consciousness:pt states that since the incident it hurts to open his eye and he has pain behind his eye:pt denies visual changes  Patient is a 56 y.o. male presenting with eye injury. The history is provided by the patient. No language interpreter was used.  Eye Injury This is a new problem. The current episode started in the past 7 days. The problem occurs constantly. The problem has been unchanged.    Past Medical History  Diagnosis Date  . Hypertension   . Diabetes mellitus   . Arthritis   . GERD (gastroesophageal reflux disease)   . Migraines   . Neuropathy   . Ruptured lumbar disc     Past Surgical History  Procedure Date  . Knee surgery     bil    No family history on file.  History  Substance Use Topics  . Smoking status: Current Everyday Smoker  . Smokeless tobacco: Not on file  . Alcohol Use: Yes     social      Review of Systems  All other systems reviewed and are negative.    Allergies  Review of patient's allergies indicates no known allergies.  Home Medications   Current Outpatient Rx  Name Route Sig Dispense Refill  . AMLODIPINE BESYLATE 10 MG PO TABS Oral Take 10 mg by mouth daily.    Marland Kitchen GLIPIZIDE ER 5 MG PO TB24 Oral Take 5 mg by mouth daily.    Marland Kitchen HYDROCODONE-ACETAMINOPHEN 10-750 MG PO TABS Oral Take 1 tablet by mouth every 6 (six) hours as needed. For pain    . METFORMIN HCL 1000 MG PO TABS Oral Take 1,000 mg by mouth 2 (two) times daily.    . NYSTATIN 100000 UNIT/GM EX CREA Topical Apply 1 application topically 3  (three) times daily.    Marland Kitchen PANTOPRAZOLE SODIUM 40 MG PO TBEC Oral Take 40 mg by mouth daily.    Marland Kitchen PREGABALIN 50 MG PO CAPS Oral Take 50 mg by mouth at bedtime.    Marland Kitchen PSEUDOEPH-DOXYLAMINE-DM-APAP 60-12.02-18-999 MG/30ML PO LIQD Oral Take 30 mLs by mouth at bedtime as needed. For chest congestion    . QUINAPRIL HCL 40 MG PO TABS Oral Take 40 mg by mouth at bedtime.    Marland Kitchen SILDENAFIL CITRATE 100 MG PO TABS Oral Take 100 mg by mouth daily as needed.      BP 187/96  Pulse 84  Temp(Src) 98.4 F (36.9 C) (Oral)  Resp 18  Ht 6' (1.829 m)  Wt 175 lb (79.379 kg)  BMI 23.73 kg/m2  SpO2 100%  Physical Exam  Nursing note and vitals reviewed. Constitutional: He is oriented to person, place, and time. He appears well-developed and well-nourished.  HENT:  Head: Normocephalic and atraumatic.  Right Ear: External ear normal.  Left Ear: External ear normal.  Eyes: Conjunctivae and EOM are normal. Pupils are equal, round, and reactive to light.  Neck: Neck supple.  Cardiovascular: Normal rate and regular rhythm.   Pulmonary/Chest: Effort normal and breath sounds normal.  Abdominal: Soft. Bowel sounds are normal. There is  no tenderness.  Musculoskeletal: Normal range of motion.  Neurological: He is alert and oriented to person, place, and time.  Skin: Skin is warm and dry.  Psychiatric: He has a normal mood and affect.    ED Course  Procedures (including critical care time)  Labs Reviewed - No data to display Ct Head Wo Contrast  11/16/2011  *RADIOLOGY REPORT*  Clinical Data: Left orbital pain.  Dizziness.  Syncope.  Diabetes. Migraine headaches.  Hypertension.  CT HEAD WITHOUT CONTRAST  Technique:  Contiguous axial images were obtained from the base of the skull through the vertex without contrast.  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  There is likely a tiny remote lacunar infarct in the left periventricular white matter on image 19 of  series 2.  The brain stem, cerebellum, cerebral peduncles, thalami, basal ganglia, basilar cisterns, and ventricular system appear unremarkable.  No intracranial hemorrhage, mass lesion, or acute infarction is identified.  There is chronic maxillary and ethmoid sinusitis.  An old left medial orbital wall fracture is present.  IMPRESSION:  1.  Old left medial orbital wall fracture, but without acute left orbital findings. 2.  Chronic maxillary and ethmoid sinusitis. 3.  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 4.  A tiny remote lacunar infarct in the left periventricular white matter is suggested on image 19 of series 2.  No acute findings are identified.  Original Report Authenticated By: Carron Curie, M.D.     1. Eye injury       MDM  No acute finding noted on ct scan:pt has negative stain exam:pt eoms intact:will have pt follow up with optho:pt asking for note to say that he can't participate in driving eye test in 4 days:discussed with pt that if he feels like he can't do the test then he needs to be reseen:discussed bp with pt       Glendell Docker, NP 11/16/11 2035

## 2012-04-28 ENCOUNTER — Emergency Department (HOSPITAL_COMMUNITY): Payer: Self-pay

## 2012-04-28 ENCOUNTER — Encounter (HOSPITAL_COMMUNITY): Payer: Self-pay | Admitting: Emergency Medicine

## 2012-04-28 ENCOUNTER — Emergency Department (HOSPITAL_COMMUNITY)
Admission: EM | Admit: 2012-04-28 | Discharge: 2012-04-29 | Disposition: A | Discharge status: Home / Self Care | Payer: Self-pay | Attending: Emergency Medicine | Admitting: Emergency Medicine

## 2012-04-28 DIAGNOSIS — Z79899 Other long term (current) drug therapy: Secondary | ICD-10-CM | POA: Insufficient documentation

## 2012-04-28 DIAGNOSIS — K219 Gastro-esophageal reflux disease without esophagitis: Secondary | ICD-10-CM | POA: Insufficient documentation

## 2012-04-28 DIAGNOSIS — R109 Unspecified abdominal pain: Secondary | ICD-10-CM | POA: Insufficient documentation

## 2012-04-28 DIAGNOSIS — I160 Hypertensive urgency: Secondary | ICD-10-CM

## 2012-04-28 DIAGNOSIS — F17200 Nicotine dependence, unspecified, uncomplicated: Secondary | ICD-10-CM | POA: Insufficient documentation

## 2012-04-28 DIAGNOSIS — E119 Type 2 diabetes mellitus without complications: Secondary | ICD-10-CM | POA: Insufficient documentation

## 2012-04-28 DIAGNOSIS — I10 Essential (primary) hypertension: Secondary | ICD-10-CM | POA: Insufficient documentation

## 2012-04-28 DIAGNOSIS — R110 Nausea: Secondary | ICD-10-CM | POA: Insufficient documentation

## 2012-04-28 LAB — URINALYSIS, ROUTINE W REFLEX MICROSCOPIC
Glucose, UA: 250 mg/dL — AB
Leukocytes, UA: NEGATIVE
Specific Gravity, Urine: 1.032 — ABNORMAL HIGH (ref 1.005–1.030)
Urobilinogen, UA: 1 mg/dL (ref 0.0–1.0)

## 2012-04-28 LAB — CBC WITH DIFFERENTIAL/PLATELET
Hemoglobin: 14.1 g/dL (ref 13.0–17.0)
Lymphs Abs: 1.8 10*3/uL (ref 0.7–4.0)
Monocytes Relative: 9 % (ref 3–12)
Neutro Abs: 3.7 10*3/uL (ref 1.7–7.7)
Neutrophils Relative %: 60 % (ref 43–77)
Platelets: 197 10*3/uL (ref 150–400)
RBC: 4.53 MIL/uL (ref 4.22–5.81)
WBC: 6.2 10*3/uL (ref 4.0–10.5)

## 2012-04-28 LAB — COMPREHENSIVE METABOLIC PANEL
AST: 13 U/L (ref 0–37)
BUN: 17 mg/dL (ref 6–23)
CO2: 28 mEq/L (ref 19–32)
Calcium: 8.6 mg/dL (ref 8.4–10.5)
Creatinine, Ser: 1.78 mg/dL — ABNORMAL HIGH (ref 0.50–1.35)
GFR calc Af Amer: 48 mL/min — ABNORMAL LOW (ref >90)
GFR calc non Af Amer: 42 mL/min — ABNORMAL LOW (ref >90)
Glucose, Bld: 223 mg/dL — ABNORMAL HIGH (ref 70–99)

## 2012-04-28 LAB — HEPATIC FUNCTION PANEL
Bilirubin, Direct: <0.1 mg/dL (ref 0.0–0.3)
Total Bilirubin: 0.1 mg/dL — ABNORMAL LOW (ref 0.3–1.2)

## 2012-04-28 LAB — URINE MICROSCOPIC-ADD ON

## 2012-04-28 MED ORDER — ONDANSETRON HCL 4 MG/2ML IJ SOLN
4.0000 mg | Freq: Once | INTRAMUSCULAR | Status: AC
  Administered 2012-04-28: 4 mg via INTRAVENOUS
  Filled 2012-04-28: qty 2

## 2012-04-28 MED ORDER — CLONIDINE HCL 0.1 MG PO TABS
0.2000 mg | ORAL_TABLET | Freq: Once | ORAL | Status: AC
  Administered 2012-04-28: 0.2 mg via ORAL
  Filled 2012-04-28: qty 2

## 2012-04-28 MED ORDER — POTASSIUM CHLORIDE CRYS ER 20 MEQ PO TBCR
40.0000 meq | EXTENDED_RELEASE_TABLET | Freq: Once | ORAL | Status: AC
  Administered 2012-04-28: 40 meq via ORAL
  Filled 2012-04-28: qty 2

## 2012-04-28 MED ORDER — SODIUM CHLORIDE 0.9 % IV SOLN
1000.0000 mL | INTRAVENOUS | Status: DC
  Administered 2012-04-28: 1000 mL via INTRAVENOUS

## 2012-04-28 NOTE — ED Provider Notes (Addendum)
History     CSN: [id number]  Arrival date & time 04/28/12  1819   First MD Initiated Contact with Patient 04/28/12 1914      Chief Complaint  Patient presents with  . Abdominal Pain  . Nausea    (Consider location/radiation/quality/duration/timing/severity/associated sxs/prior treatment) HPI Comments: 56 year old male with a history of hypertension, GERD, and diabetes who presented to the emergency room with complaints of bilateral flank pain and lower quadrant pain. Pt states that he has had this pain off and on for quite some time, but over the past 3 days the pain is significant. The pain is located in bilateral flank area, with right > left and is sharp in nature and constant. Pt has associated nausea with no emesis recently and some subject vie fevers and chills. Pt also has no diarrhea, no UTI like sx, and he has hx of renal stones, but hte pain currently is not similar in nature. Pt gets his care at the [place] and had CT at [facility] last month and was told he had a renal cyst.  Patient is a 56 y.o. male presenting with abdominal pain. The history is provided by the patient and medical records.  Abdominal Pain The primary symptoms of the illness include abdominal pain, nausea and vomiting. The primary symptoms of the illness do not include shortness of breath or dysuria.  Symptoms associated with the illness do not include urgency, hematuria or frequency.    Past Medical History  Diagnosis Date  . Hypertension   . Diabetes mellitus   . Arthritis   . GERD (gastroesophageal reflux disease)   . Migraines   . Neuropathy   . Ruptured lumbar disc     Past Surgical History  Procedure Date  . Knee surgery     bil    No family history on file.  History  Substance Use Topics  . Smoking status: Current Everyday Smoker -- 1.0 packs/day    Types: Cigarettes  . Smokeless tobacco: Not on file  . Alcohol Use: No      Review of Systems  Constitutional: Negative for activity  change and appetite change.  Respiratory: Negative for cough and shortness of breath.   Cardiovascular: Negative for chest pain.  Gastrointestinal: Positive for nausea, vomiting and abdominal pain.  Genitourinary: Positive for flank pain. Negative for dysuria, urgency, frequency and hematuria.    Allergies  Review of patient's allergies indicates no known allergies.  Home Medications   Current Outpatient Rx  Name Route Sig Dispense Refill  . AMLODIPINE BESYLATE 10 MG PO TABS Oral Take 10 mg by mouth daily.    Marland Kitchen CLONIDINE HCL 0.1 MG PO TABS Oral Take 0.1 mg by mouth 2 (two) times daily.    Marland Kitchen GABAPENTIN 300 MG PO CAPS Oral Take 300 mg by mouth at bedtime.    Marland Kitchen GLIPIZIDE ER 5 MG PO TB24 Oral Take 5 mg by mouth daily.    Marland Kitchen METHOCARBAMOL 500 MG PO TABS Oral Take 1,000 mg by mouth at bedtime.    . NYSTATIN 100000 UNIT/GM EX CREA Topical Apply 1 application topically 3 (three) times daily.    Marland Kitchen OMEPRAZOLE 20 MG PO CPDR Oral Take 40 mg by mouth daily.    Marland Kitchen PANTOPRAZOLE SODIUM 40 MG PO TBEC Oral Take 40 mg by mouth daily.    Marland Kitchen PREGABALIN 50 MG PO CAPS Oral Take 50 mg by mouth at bedtime.    Marland Kitchen RABEPRAZOLE SODIUM 20 MG PO TBEC Oral Take 20  mg by mouth daily.    Marland Kitchen RANITIDINE HCL 300 MG PO TABS Oral Take 300 mg by mouth 2 (two) times daily.    . SERTRALINE HCL 100 MG PO TABS Oral Take 100 mg by mouth daily.      BP 194/108  Pulse 80  Temp 99.2 F (37.3 C) (Oral)  Resp 14  SpO2 100%  Physical Exam  Constitutional: He is oriented to person, place, and time. He appears well-developed.  HENT:  Head: Normocephalic and atraumatic.  Eyes: Conjunctivae and EOM are normal. Pupils are equal, round, and reactive to light.  Neck: Normal range of motion. Neck supple.  Cardiovascular: Normal rate and regular rhythm.   Pulmonary/Chest: Effort normal and breath sounds normal.  Abdominal: Soft. Bowel sounds are normal. He exhibits no distension. There is tenderness. There is no rebound and no guarding.         Bilateral flank pain and suprapubic tenderness with both lower quadrant being tender as well.  Neurological: He is alert and oriented to person, place, and time.  Skin: Skin is warm.    ED Course  Procedures (including critical care time)   Labs Reviewed  COMPREHENSIVE METABOLIC PANEL  CBC WITH DIFFERENTIAL  URINALYSIS, ROUTINE W REFLEX MICROSCOPIC  URINE CULTURE   No results found.   No diagnosis found.    MDM  DDx includes: Pancreatitis PUD Hepatobiliary pathology including cholecystitis SBO ACS syndrome Aortic Dissection AAA Renal cyst Hydronephrosis Renal stones  Pt comes in with bilateral flank pain and is also having some lower quadrant abd pain. He has no peritoneal findings. No UTI like sx, and he doesn't think the pain is similar to stones. We are not sure what the etiology for the pain is at this time, we will get basic workup going and get US kidneys.     11:38 PM Pt is pain free at this time. Cr is slightly elevated from baseline. No UTI. Pt's [place] is unremarkable besides the cyst and parenchymal swelling - which is likely not new. Will discharge. Pt is awaiting appointment from Dr. Eddie Dibbles at the [facility]. Pt's BP is slightly high, still no chest pain, sob, headaches. Will give his home dose clonidine.  Varney Biles, MD 04/28/12 [phone]

## 2012-04-28 NOTE — ED Notes (Signed)
Pt presenting to ed with c/o abdominal pain with positive nausea, vomiting and diarrhea x 2-3 days. Pt with bilateral side pain. Pt states dysuria. Pt states urinary urgency and frequency sometimes.

## 2012-04-28 NOTE — ED Notes (Signed)
Multiple attempts at IV, IV team paged

## 2012-04-28 NOTE — ED Notes (Signed)
Patient reminded that urine is ordered and still needs to be collected. Patient aware that if urine is not collected, in and out cath may be order to obtain urine.

## 2012-04-28 NOTE — ED Notes (Signed)
Patient aware of need for urine specimen. Patient unable to void at this time. Patient given urinal. Encouraged to call for assistance if needed.   

## 2012-04-29 LAB — URINE CULTURE
Culture: NO GROWTH
Special Requests: NORMAL

## 2012-04-29 NOTE — ED Notes (Signed)
Discharge instructions reviewed w/ pt., verbalizes understanding. No prescriptions provided at discharge. To call PCP regarding continued elevated BP on Clonidine 0.1 mg daily

## 2012-05-21 ENCOUNTER — Inpatient Hospital Stay (HOSPITAL_COMMUNITY): Payer: Self-pay

## 2012-05-21 ENCOUNTER — Encounter (HOSPITAL_COMMUNITY): Payer: Self-pay

## 2012-05-21 ENCOUNTER — Inpatient Hospital Stay (HOSPITAL_COMMUNITY)
Admission: EM | Admit: 2012-05-21 | Discharge: 2012-05-23 | DRG: 069 | Disposition: A | Discharge status: Home / Self Care | Payer: Non-veteran care | Attending: Neurology | Admitting: Neurology

## 2012-05-21 ENCOUNTER — Emergency Department (HOSPITAL_COMMUNITY): Payer: Self-pay

## 2012-05-21 DIAGNOSIS — F17200 Nicotine dependence, unspecified, uncomplicated: Secondary | ICD-10-CM | POA: Diagnosis present

## 2012-05-21 DIAGNOSIS — I161 Hypertensive emergency: Secondary | ICD-10-CM

## 2012-05-21 DIAGNOSIS — I639 Cerebral infarction, unspecified: Secondary | ICD-10-CM

## 2012-05-21 DIAGNOSIS — M129 Arthropathy, unspecified: Secondary | ICD-10-CM | POA: Diagnosis present

## 2012-05-21 DIAGNOSIS — E876 Hypokalemia: Secondary | ICD-10-CM | POA: Diagnosis present

## 2012-05-21 DIAGNOSIS — G459 Transient cerebral ischemic attack, unspecified: Principal | ICD-10-CM | POA: Diagnosis present

## 2012-05-21 DIAGNOSIS — E119 Type 2 diabetes mellitus without complications: Secondary | ICD-10-CM | POA: Diagnosis present

## 2012-05-21 DIAGNOSIS — I129 Hypertensive chronic kidney disease with stage 1 through stage 4 chronic kidney disease, or unspecified chronic kidney disease: Secondary | ICD-10-CM | POA: Diagnosis present

## 2012-05-21 DIAGNOSIS — Z79899 Other long term (current) drug therapy: Secondary | ICD-10-CM

## 2012-05-21 DIAGNOSIS — K219 Gastro-esophageal reflux disease without esophagitis: Secondary | ICD-10-CM | POA: Diagnosis present

## 2012-05-21 DIAGNOSIS — I10 Essential (primary) hypertension: Secondary | ICD-10-CM

## 2012-05-21 DIAGNOSIS — N289 Disorder of kidney and ureter, unspecified: Secondary | ICD-10-CM | POA: Diagnosis present

## 2012-05-21 DIAGNOSIS — I6350 Cerebral infarction due to unspecified occlusion or stenosis of unspecified cerebral artery: Secondary | ICD-10-CM

## 2012-05-21 DIAGNOSIS — N189 Chronic kidney disease, unspecified: Secondary | ICD-10-CM | POA: Diagnosis present

## 2012-05-21 DIAGNOSIS — G8190 Hemiplegia, unspecified affecting unspecified side: Secondary | ICD-10-CM | POA: Diagnosis present

## 2012-05-21 HISTORY — DX: Hypertensive emergency: I16.1

## 2012-05-21 LAB — COMPREHENSIVE METABOLIC PANEL
Albumin: 2.9 g/dL — ABNORMAL LOW (ref 3.5–5.2)
Alkaline Phosphatase: 81 U/L (ref 39–117)
BUN: 25 mg/dL — ABNORMAL HIGH (ref 6–23)
CO2: 28 mEq/L (ref 19–32)
Chloride: 102 mEq/L (ref 96–112)
Creatinine, Ser: 2.29 mg/dL — ABNORMAL HIGH (ref 0.50–1.35)
GFR calc Af Amer: 36 mL/min — ABNORMAL LOW (ref >90)
GFR calc non Af Amer: 31 mL/min — ABNORMAL LOW (ref >90)
Glucose, Bld: 155 mg/dL — ABNORMAL HIGH (ref 70–99)
Potassium: 3 mEq/L — ABNORMAL LOW (ref 3.5–5.1)
Total Bilirubin: 0.2 mg/dL — ABNORMAL LOW (ref 0.3–1.2)

## 2012-05-21 LAB — POCT I-STAT, CHEM 8
BUN: 24 mg/dL — ABNORMAL HIGH (ref 6–23)
Creatinine, Ser: 2.1 mg/dL — ABNORMAL HIGH (ref 0.50–1.35)
Potassium: 3.2 mEq/L — ABNORMAL LOW (ref 3.5–5.1)
Sodium: 143 mEq/L (ref 135–145)
TCO2: 28 mmol/L (ref 0–100)

## 2012-05-21 LAB — URINE MICROSCOPIC-ADD ON

## 2012-05-21 LAB — DIFFERENTIAL
Basophils Relative: 0 % (ref 0–1)
Lymphs Abs: 1.9 10*3/uL (ref 0.7–4.0)
Monocytes Absolute: 0.6 10*3/uL (ref 0.1–1.0)
Monocytes Relative: 9 % (ref 3–12)
Neutro Abs: 4.4 10*3/uL (ref 1.7–7.7)
Neutrophils Relative %: 63 % (ref 43–77)

## 2012-05-21 LAB — CK TOTAL AND CKMB (NOT AT ARMC)
CK, MB: 2.8 ng/mL (ref 0.3–4.0)
Total CK: 76 U/L (ref 7–232)

## 2012-05-21 LAB — URINALYSIS, ROUTINE W REFLEX MICROSCOPIC
Bilirubin Urine: NEGATIVE
Glucose, UA: 250 mg/dL — AB
Ketones, ur: NEGATIVE mg/dL
Protein, ur: >300 mg/dL — AB
pH: 5.5 (ref 5.0–8.0)

## 2012-05-21 LAB — GLUCOSE, CAPILLARY
Glucose-Capillary: 143 mg/dL — ABNORMAL HIGH (ref 70–99)
Glucose-Capillary: 163 mg/dL — ABNORMAL HIGH (ref 70–99)

## 2012-05-21 LAB — APTT: aPTT: 26 seconds (ref 24–37)

## 2012-05-21 LAB — MRSA PCR SCREENING: MRSA by PCR: NEGATIVE

## 2012-05-21 LAB — CBC
HCT: 37.6 % — ABNORMAL LOW (ref 39.0–52.0)
Hemoglobin: 13.3 g/dL (ref 13.0–17.0)
MCHC: 35.4 g/dL (ref 30.0–36.0)
RBC: 4.38 MIL/uL (ref 4.22–5.81)

## 2012-05-21 MED ORDER — ACETAMINOPHEN 325 MG PO TABS: 650.0000 mg | ORAL_TABLET | ORAL | Status: DC | PRN

## 2012-05-21 MED ORDER — SENNOSIDES-DOCUSATE SODIUM 8.6-50 MG PO TABS
1.0000 | ORAL_TABLET | Freq: Every evening | ORAL | Status: DC | PRN
  Filled 2012-05-21: qty 1

## 2012-05-21 MED ORDER — POTASSIUM CHLORIDE CRYS ER 20 MEQ PO TBCR
40.0000 meq | EXTENDED_RELEASE_TABLET | Freq: Once | ORAL | Status: AC
  Administered 2012-05-21: 40 meq via ORAL
  Filled 2012-05-21: qty 2

## 2012-05-21 MED ORDER — LABETALOL HCL 5 MG/ML IV SOLN
10.0000 mg | Freq: Once | INTRAVENOUS | Status: AC
  Administered 2012-05-21: 10 mg via INTRAVENOUS

## 2012-05-21 MED ORDER — SERTRALINE HCL 100 MG PO TABS
100.0000 mg | ORAL_TABLET | Freq: Every day | ORAL | Status: DC
  Administered 2012-05-22: 100 mg via ORAL
  Filled 2012-05-21 (×3): qty 1

## 2012-05-21 MED ORDER — LABETALOL HCL 5 MG/ML IV SOLN: 10.0000 mg | INTRAVENOUS | Status: DC | PRN

## 2012-05-21 MED ORDER — SODIUM CHLORIDE 0.9 % IV SOLN
INTRAVENOUS | Status: DC
  Administered 2012-05-21: 18:00:00 via INTRAVENOUS

## 2012-05-21 MED ORDER — ALTEPLASE (STROKE) FULL DOSE INFUSION
0.9000 mg/kg | Freq: Once | INTRAVENOUS | Status: AC
  Administered 2012-05-21: 74 mg via INTRAVENOUS
  Filled 2012-05-21: qty 74

## 2012-05-21 MED ORDER — INSULIN ASPART 100 UNIT/ML ~~LOC~~ SOLN
0.0000 [IU] | SUBCUTANEOUS | Status: DC
  Administered 2012-05-21: 1 [IU] via SUBCUTANEOUS
  Administered 2012-05-21 – 2012-05-22 (×2): 2 [IU] via SUBCUTANEOUS
  Administered 2012-05-22: 3 [IU] via SUBCUTANEOUS
  Administered 2012-05-22 (×2): 1 [IU] via SUBCUTANEOUS

## 2012-05-21 MED ORDER — NICARDIPINE HCL IN NACL 20-0.86 MG/200ML-% IV SOLN
5.0000 mg/h | INTRAVENOUS | Status: DC
  Administered 2012-05-21: 5 mg/h via INTRAVENOUS
  Administered 2012-05-21: 2.5 mg/h via INTRAVENOUS
  Filled 2012-05-21 (×3): qty 200

## 2012-05-21 MED ORDER — POTASSIUM CHLORIDE 10 MEQ/100ML IV SOLN: 10.0000 meq | INTRAVENOUS | Status: DC

## 2012-05-21 MED ORDER — PANTOPRAZOLE SODIUM 40 MG IV SOLR
40.0000 mg | INTRAVENOUS | Status: DC
  Filled 2012-05-21 (×2): qty 40

## 2012-05-21 MED ORDER — PANTOPRAZOLE SODIUM 40 MG IV SOLR: 40.0000 mg | Freq: Every day | INTRAVENOUS | Status: DC

## 2012-05-21 MED ORDER — ACETAMINOPHEN 650 MG RE SUPP: 650.0000 mg | RECTAL | Status: DC | PRN

## 2012-05-21 MED ORDER — ONDANSETRON HCL 4 MG/2ML IJ SOLN: 4.0000 mg | Freq: Four times a day (QID) | INTRAMUSCULAR | Status: DC | PRN

## 2012-05-21 MED ORDER — LABETALOL HCL 5 MG/ML IV SOLN
INTRAVENOUS | Status: AC
  Administered 2012-05-21: 10 mg via INTRAVENOUS
  Filled 2012-05-21: qty 4

## 2012-05-21 NOTE — Consult Note (Signed)
Admission H&P    Code stroke:  LSN: 12:30 tPA given yes pateint had waxing and waning of right sided weakness Specifically on the right LE Delay in TPA was secondary to second IV Line NIH scale 4  Chief Complaint:  Right hemiparesis HPI:  [name] who has significant stroke risk factors, who comes in with acute onset of right hemiparesis, but denied headaches, no migraine features  ( even though he has a history of migraine),  No nausea, no vomiting, no history of seizures. On his way his right UE got better but Right LE was still weak. Made attempt to do MRI of the brain because there was some psychogenic component but MRI scanner was readily obtainable. Decision was made to give tPA. Patient does not take ASA at home. 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  But nothing acute. It is less likely that his old stroke is producing his symptoms and this is not secondary to migraine either.  Glucose was 143 SBP 153 INR 0.95 Platelets: 180  I and stroke coordinator discussed the risk VS benefit of tPA and patient agreed.  Given the stroke risk factors and significant weakness of the right     Past Medical History  Diagnosis Date  . Hypertension   . Arthritis   . GERD (gastroesophageal reflux disease)   . Migraines   . Neuropathy   . Ruptured lumbar disc   . Diabetes mellitus     diet controlled    Past Surgical History  Procedure Date  . Knee surgery     bil    No family history on file. Social History:  reports that he has been smoking Cigarettes.  He has been smoking about 1 pack per day. He does not have any smokeless tobacco history on file. He reports that he uses illicit drugs (Marijuana). He reports that he does not drink alcohol.  Allergies: No Known Allergies   (Not in a hospital admission)  ROS:  12 point review of systems were reviewed and pertinent ones are mentioned in the HPI  Physical Examination: Blood pressure 151/79, pulse  78, resp. rate 18, height 6' (1.829 m), weight 81.647 kg (180 lb), SpO2 100.00%.  Physical Exam: General: NAD, AAO HEENT-  Normocephalic, no lesions, without obvious abnormality.  Normal external eye and conjunctiva.  Normal TM's bilaterally.  Normal auditory canals and external ears. Normal external nose, mucus membranes and septum.  Normal pharynx.  Had some trouble with moving the eyes to right  Neck supple with no masses, nodes, nodules or enlargement. Cardiovascular -  No murmurs, rubs and or gallops Lungs - CTA Abdomen - S/NT/ND BS bowel sounds present Extremities - No edema  Neurologic Examination: Mental Status: Alert, oriented, thought content appropriate.  Speech fluent without evidence of aphasia.  Able to follow 3 step commands without difficulty. Cranial Nerves: II: visual fields grossly normal, pupils equal, round, reactive to light and accommodation III,IV, VI: ptosis not present, extra-ocular motions has some difficulty with right gaze but it is intermittently V,VII: smile symmetric, facial light touch sensation normal bilaterally VIII: hearing normal bilaterally IX,X: gag reflex present XI: trapezius strength/neck flexion strength normal bilaterally XII: tongue strength normal  Motor: Right : Upper extremity   4/5    Left:     Upper extremity   5/5  Lower extremity   3/5 ( changing)    Lower extremity   5/5 Tone increased in the right LE and  bulk:normal tone throughout; no atrophy noted Sensory: Pinprick and light touch decreased on the right side and face Deep Tendon Reflexes: 2+ and symmetric throughout Plantars: Right: downgoing   Left: downgoing Cerebellar: normal finger-to-nose, normal rapid alternating movements unable to do right Heel to shin    Results for orders placed during the hospital encounter of 05/21/12 (from the past 48 hour(s))  PROTIME-INR     Status: Normal   Collection Time   05/21/12  1:58 PM      Component Value Range Comment    Prothrombin Time 12.9  11.6 - 15.2 seconds    INR 0.95  0.00 - 1.49   APTT     Status: Normal   Collection Time   05/21/12  1:58 PM      Component Value Range Comment   aPTT 26  24 - 37 seconds   CBC     Status: Abnormal   Collection Time   05/21/12  1:58 PM      Component Value Range Comment   WBC 7.1  4.0 - 10.5 K/uL    RBC 4.38  4.22 - 5.81 MIL/uL    Hemoglobin 13.3  13.0 - 17.0 g/dL    HCT 37.6 (*) 39.0 - 52.0 %    MCV 85.8  78.0 - 100.0 fL    MCH 30.4  26.0 - 34.0 pg    MCHC 35.4  30.0 - 36.0 g/dL    RDW 12.8  11.5 - 15.5 %    Platelets 180  150 - 400 K/uL   DIFFERENTIAL     Status: Normal   Collection Time   05/21/12  1:58 PM      Component Value Range Comment   Neutrophils Relative 63  43 - 77 %    Neutro Abs 4.4  1.7 - 7.7 K/uL    Lymphocytes Relative 28  12 - 46 %    Lymphs Abs 1.9  0.7 - 4.0 K/uL    Monocytes Relative 9  3 - 12 %    Monocytes Absolute 0.6  0.1 - 1.0 K/uL    Eosinophils Relative 1  0 - 5 %    Eosinophils Absolute 0.1  0.0 - 0.7 K/uL    Basophils Relative 0  0 - 1 %    Basophils Absolute 0.0  0.0 - 0.1 K/uL   COMPREHENSIVE METABOLIC PANEL     Status: Abnormal   Collection Time   05/21/12  1:58 PM      Component Value Range Comment   Sodium 142  135 - 145 mEq/L    Potassium 3.0 (*) 3.5 - 5.1 mEq/L    Chloride 102  96 - 112 mEq/L    CO2 28  19 - 32 mEq/L    Glucose, Bld 155 (*) 70 - 99 mg/dL    BUN 25 (*) 6 - 23 mg/dL    Creatinine, Ser 2.29 (*) 0.50 - 1.35 mg/dL    Calcium 8.9  8.4 - 10.5 mg/dL    Total Protein 6.3  6.0 - 8.3 g/dL    Albumin 2.9 (*) 3.5 - 5.2 g/dL    AST 12  0 - 37 U/L    ALT 6  0 - 53 U/L    Alkaline Phosphatase 81  39 - 117 U/L    Total Bilirubin 0.2 (*) 0.3 - 1.2 mg/dL    GFR calc non Af Amer 31 (*) >90 mL/min    GFR calc Af Amer 36 (*) >90 mL/min  CK TOTAL AND CKMB     Status: Normal   Collection Time   05/21/12  1:59 PM      Component Value Range Comment   Total CK 76  7 - 232 U/L    CK, MB 2.8  0.3 - 4.0 ng/mL     Relative Index RELATIVE INDEX IS INVALID  0.0 - 2.5   TROPONIN I     Status: Normal   Collection Time   05/21/12  1:59 PM      Component Value Range Comment   Troponin I <0.30  <0.30 ng/mL   POCT I-STAT, CHEM 8     Status: Abnormal   Collection Time   05/21/12  2:12 PM      Component Value Range Comment   Sodium 143  135 - 145 mEq/L    Potassium 3.2 (*) 3.5 - 5.1 mEq/L    Chloride 102  96 - 112 mEq/L    BUN 24 (*) 6 - 23 mg/dL    Creatinine, Ser 2.10 (*) 0.50 - 1.35 mg/dL    Glucose, Bld 151 (*) 70 - 99 mg/dL    Calcium, Ion 1.13  1.12 - 1.23 mmol/L    TCO2 28  0 - 100 mmol/L    Hemoglobin 12.9 (*) 13.0 - 17.0 g/dL    HCT 38.0 (*) 39.0 - 52.0 %   GLUCOSE, CAPILLARY     Status: Abnormal   Collection Time   05/21/12  2:30 PM      Component Value Range Comment   Glucose-Capillary 143 (*) 70 - 99 mg/dL    Ct Head Wo Contrast  05/21/2012  *RADIOLOGY REPORT*  Clinical Data: Code stroke.  Right-sided weakness.  CT HEAD WITHOUT CONTRAST  Technique:  Contiguous axial images were obtained from the base of the skull through the vertex without contrast.  Comparison: CT 11/16/2011  Findings: Mild atrophy.  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  Negative for acute infarct.  Negative for hemorrhage or mass.  Calvarium is intact.  IMPRESSION: No acute abnormality.  Critical Value/emergent results were called by telephone at the time of interpretation on 05/21/2012 at  1415 hours to Dr. Eulis Foster, who verbally acknowledged these results.   Original Report Authenticated By: Truett Perna, M.D.     Assessment/Plan  Patient with stroke risk factors, right hemiplegia was the initial presentation, then it improved to right hemiparesis There was some psychogenic component, but stroke could not be ruled out, MRI could not be done because scanner was not available and was running out of time to meet the tPA window time  1) Admit to ICU  D/w ICU team 2) S/p tpA care set  : maintain BP 180/90  . NO  IV line placement, foley or other invasive procedure for 24 hours  Repeat Head CT within 24 hours : ASA 24 hours post TPA : No Heparin or other forms of anticoaguation for 24 hours, use SCD's : If mental status changes Please do a stat non contrast head CT to rule out bleed : Use labetolol  For BP 3) MRI of the brain and MRA of the head and neck 4) Echo 5) Stroke labs 6) PT/OT/ST 7) fall and aspiration risk   Shylin Keizer V-P Donnie Mesa., MD., Ph.D.,MS 05/21/2012 4:08 PM

## 2012-05-21 NOTE — ED Notes (Signed)
TPA is completed at this time. Line is flushing with NS. Pt being transported to Neuro ICU

## 2012-05-21 NOTE — ED Provider Notes (Signed)
Richard [name] is a 56 y.o. male who is being seen by the stroke team for code stroke. I was asked to start a peripheral IV, urgently to help with administration of his TPA.  First attempt was left EJ. Patient did not tolerate continued cannulation attempt, after a single stick. No hematoma after apparatus was removed.  Second attempt was peripheral IV, right arm, ultrasound guidance. Using ultrasound, a right antecubital vein was located. A 20-gauge 1.88 inch catheter was inserted, good blood flow was obtained. The catheter could not be inserted completely into the vein; it was able to infuse and withdraw blood easily. It was partially in (about 1 in), catheter was taped in place, and an OpSite was placed over it. This catheter can function as a second IV, when necessary.  Patient is being admitted by critical care with stroke team consult.   Richarda Blade, MD 05/21/12 [phone]

## 2012-05-21 NOTE — ED Notes (Signed)
Dr. Donnie Mesa at the bedside.

## 2012-05-21 NOTE — ED Provider Notes (Signed)
Pt has been seen primarily by the neurohospitalist.  He is actively managing this visit.  Will follow closely while he is in the ER and assist with care as necessary or appropriate.  Perlie Mayo, MD 05/21/12 [phone]

## 2012-05-21 NOTE — ED Notes (Signed)
Pt has been stuck multiple times by this RN, Cinda Quest, RN; and Velva Harman, Rapid Response RN. At this time Dr. Eulis Foster at bedside to utilize bedside u/s for attempt at IV access.

## 2012-05-21 NOTE — Consult Note (Signed)
Name: Richard [name] MRN: [id number] DOB: [birth year]    LOS: 0  Referring Provider:  Stroke, Neuro Reason for Referral:  Cva, tpa, icu management  PULMONARY / CRITICAL CARE MEDICINE  HPI:  56 yr old AAM, h/o HTN presented with acute onset of right hemiparesis, but denied headaches, No nausea, no vomiting, no history of seizures. On his way his right UE got better but Right LE was still weak. He admited to rt upper and lower weakness. IN ed had some resolution rue but rll maintained to be weak. Neuro gave TPA after neg acute CT head. MRi was unavailable for further evaluation. IN ed NO cp, no sob. Called to co manage.   Past Medical History  Diagnosis Date  . Hypertension   . Arthritis   . GERD (gastroesophageal reflux disease)   . Migraines   . Neuropathy   . Ruptured lumbar disc   . Diabetes mellitus     diet controlled   Past Surgical History  Procedure Date  . Knee surgery     bil   Prior to Admission medications   Medication Sig Start Date End Date Taking? Authorizing Provider  amLODipine (NORVASC) 10 MG tablet Take 10 mg by mouth daily.    Historical Provider, MD  cloNIDine (CATAPRES) 0.1 MG tablet Take 0.1 mg by mouth 2 (two) times daily.    Historical Provider, MD  gabapentin (NEURONTIN) 300 MG capsule Take 300 mg by mouth at bedtime.    Historical Provider, MD  glipiZIDE (GLUCOTROL XL) 5 MG 24 hr tablet Take 5 mg by mouth daily.    Historical Provider, MD  methocarbamol (ROBAXIN) 500 MG tablet Take 1,000 mg by mouth at bedtime.    Historical Provider, MD  nystatin cream (MYCOSTATIN) Apply 1 application topically 3 (three) times daily.    Historical Provider, MD  omeprazole (PRILOSEC) 20 MG capsule Take 40 mg by mouth daily.    Historical Provider, MD  pantoprazole (PROTONIX) 40 MG tablet Take 40 mg by mouth daily.    Historical Provider, MD  pregabalin (LYRICA) 50 MG capsule Take 50 mg by mouth at bedtime.    Historical Provider, MD  RABEprazole (ACIPHEX) 20 MG tablet  Take 20 mg by mouth daily.    Historical Provider, MD  ranitidine (ZANTAC) 300 MG tablet Take 300 mg by mouth 2 (two) times daily.    Historical Provider, MD  sertraline (ZOLOFT) 100 MG tablet Take 100 mg by mouth daily.    Historical Provider, MD   Allergies No Known Allergies  Family History No family history on file. Social History  reports that he has been smoking Cigarettes.  He has been smoking about 1 pack per day. He does not have any smokeless tobacco history on file. He reports that he uses illicit drugs (Marijuana). He reports that he does not drink alcohol.  Review Of Systems:  No n/v/d/ abdo pain, no claudication symptoms, noever told has kidney dz  Brief patient description:  56 yr old HTN s/p TPA for cva.  Events Since Admission: 8/30- tpa, cva  Current Status: No distress  Vital Signs: Temp:  [98.2 F (36.8 C)] 98.2 F (36.8 C) (08/30 1545) Pulse Rate:  [78] 78  (08/30 1412) Resp:  [16-18] 16  (08/30 1615) BP: (151-187)/(79-98) 180/95 mmHg (08/30 1615) SpO2:  [100 %] 100 % (08/30 1615) Weight:  [81.647 kg (180 lb)] 81.647 kg (180 lb) (08/30 1412)  Physical Examination: General:  Awake, anxious Neuro:  Rue, left 5/5, rt lower 4.5  strength, sens intact, perrl 5 mm HEENT:  jvd noted Neck:  jvd noted Cardiovascular: s1 s2 rrr no r/m   Lungs:  Slight distant Abdomen:  Soft, BS wnl no r/g Skin:  No rash  Active Problems:  * No active hospital problems. *    ASSESSMENT AND PLAN  PULMONARY No results found for this basename: PHART:5,PCO2:5,PCO2ART:5,PO2ART:5,HCO3:5,O2SAT:5 in the last 168 hours Ventilator Settings:   CXR:  needed  A:  H/o smoking P:   pcxr No role nebs  CARDIOVASCULAR  Lab 05/21/12 1359  TROPONINI <0.30  LATICACIDVEN --  PROBNP --   ECG:  LVH, atrial hypertrophy left, SR, V2 relate to repol Lines: PIV difficult  A: HTN crisis, r/o ischemia, LVH, r/o HTN cardiomyoapthy P:  Agree nicardipine sys 180 goal ecg reviewed,  consider r/o ischemia Echo No asa s/p tpa, consider 24 hrs post tpa Early continuation clonidine to avoid rebound Hold norvasc  RENAL  Lab 05/21/12 1412 05/21/12 1358  NA 143 142  K 3.2* 3.0*  CL 102 102  CO2 -- 28  BUN 24* 25*  CREATININE 2.10* 2.29*  CALCIUM -- 8.9  MG -- --  PHOS -- --   Intake/Output    None    Foley:    A:  Likely acute on chronic renal failure, hypokalemia P:   Renal US Control BP Chem follow up Replace K UA  GASTROINTESTINAL  Lab 05/21/12 1358  AST 12  ALT 6  ALKPHOS 81  BILITOT 0.2*  PROT 6.3  ALBUMIN 2.9*    A:  At risk gastric stress, GERD P:   Add ppi npo  HEMATOLOGIC  Lab 05/21/12 1412 05/21/12 1358  HGB 12.9* 13.3  HCT 38.0* 37.6*  PLT -- 180  INR -- 0.95  APTT -- 26   A:  At risk dvt P:  Scd, s/p tpa  INFECTIOUS  Lab 05/21/12 1358  WBC 7.1  PROCALCITON --   Cultures:  Antibiotics:  A:  No evidence infection P:   Monitor temp curve, wbc in am   ENDOCRINE  Lab 05/21/12 1430  GLUCAP 143*   A:  DM P:   Add ssi Hold oral home DM meds  NEUROLOGIC Ct head 8/30- neg acute  A:  S/p TPA for CVA P:   Per neuro BP nicardipine, if ischemia noted then consider beta blocker in place Echo Carotids per neuro MRI consideration Ct in am HOB Hold neurntin neurochecks Add home zoloft, avoid ssri WD  BEST PRACTICE / DISPOSITION Level of Care:  ICU Primary Service:  neuro Consultants:  pccm Code Status:  full Diet:  npo DVT Px:  scd GI Px:  ppi Skin Integrity:  wnl Social / Family:  None at bedside  Ccm time 30 min   Raylene Miyamoto., M.D. Pulmonary and [place] Pager: [phone]  05/21/2012, 4:48 PM

## 2012-05-21 NOTE — ED Notes (Signed)
PCP increased the pt's BP dose and is now using the new dose.

## 2012-05-21 NOTE — ED Notes (Signed)
Dr. Otila Back at the bedside with Dr. Donnie Mesa

## 2012-05-21 NOTE — ED Notes (Signed)
Pt sts "I broke out in a sweat and my whole right side went dead".

## 2012-05-21 NOTE — ED Notes (Signed)
Pt back to room from CT with stroke team and placed on cardiac monitor

## 2012-05-21 NOTE — Code Documentation (Signed)
56 yo male who was playing pool at a friend's house when he had sudden onset of R side numbness and weakness at 1230. EMS was called, and they observed the R side to be flaccid.  They activated code stroke at 1340.  Code team arrived at 1340; pt arrived at 1351 and was cleared at the bridge at that time by the ED.  There was some concern about abnormal EKG, but this was cleared for CT.  CT showed a tiny old stroke in the L putamen, but no acute abnormality noted.  The exam revealed a NIHSS of 2, one for R leg drift and one for sensory deficit of the Rface and arm.   There was a questionable functional component to the exam, but with his h/o a small stroke, HTN, and DM doing an MRI was considered.  However, there was no MRI scanner available for 20-30 minutes, so after informed consent, the decision was made to give tPA.  Pharmacy was notified at 1437.  Meanwhile, multiple attempts were made to start a 2nd IV without success.  The EDP was finally able to get a very precarious line in the R [facility] using ultrasound.  tPA was initiated at 1528 via the L AC.  VS are w/in parameter. Pt continues to have NIHSS=2.  He is awake and cooperative, and much less anxious. (He was very distraught to learn that an old stroke was seen on CT).  He is aware of the plan to go to ICU for 24 hours, and to complete a stroke w/u. He is agreeable with the plan.

## 2012-05-21 NOTE — ED Notes (Signed)
Per EMS, pt was playing pool and noticed at 1230 that his right side "wouldn't move". Hx of HTN, DCDM, migraines. In route pt started having facial droop to right side and is able to feel the sensation but not able to grip. 12 lead showed elevation in V2 and V3. Denies cp, SOB. Unsuccessful attempt x2 at IV insertion. O2 at 4l/[place] with sats 96% to 100%. 110/58 in Right arm, 136/82 in left arm, 60 HR, weight is 180

## 2012-05-22 ENCOUNTER — Inpatient Hospital Stay (HOSPITAL_COMMUNITY): Payer: Self-pay

## 2012-05-22 LAB — RAPID URINE DRUG SCREEN, HOSP PERFORMED
Barbiturates: NOT DETECTED
Benzodiazepines: NOT DETECTED

## 2012-05-22 LAB — GLUCOSE, CAPILLARY
Glucose-Capillary: 107 mg/dL — ABNORMAL HIGH (ref 70–99)
Glucose-Capillary: 126 mg/dL — ABNORMAL HIGH (ref 70–99)
Glucose-Capillary: 155 mg/dL — ABNORMAL HIGH (ref 70–99)

## 2012-05-22 MED ORDER — POTASSIUM CHLORIDE CRYS ER 20 MEQ PO TBCR
20.0000 meq | EXTENDED_RELEASE_TABLET | Freq: Two times a day (BID) | ORAL | Status: DC
  Administered 2012-05-22 – 2012-05-23 (×3): 20 meq via ORAL
  Filled 2012-05-22 (×4): qty 1

## 2012-05-22 MED ORDER — CLONIDINE HCL 0.1 MG PO TABS
0.1000 mg | ORAL_TABLET | Freq: Once | ORAL | Status: AC
  Administered 2012-05-22: 0.1 mg via ORAL
  Filled 2012-05-22: qty 1

## 2012-05-22 MED ORDER — PANTOPRAZOLE SODIUM 40 MG PO TBEC
40.0000 mg | DELAYED_RELEASE_TABLET | Freq: Every day | ORAL | Status: DC
  Administered 2012-05-22 – 2012-05-23 (×2): 40 mg via ORAL
  Filled 2012-05-22 (×2): qty 1

## 2012-05-22 MED ORDER — GABAPENTIN 300 MG PO CAPS
300.0000 mg | ORAL_CAPSULE | Freq: Every day | ORAL | Status: DC
  Administered 2012-05-22: 300 mg via ORAL
  Filled 2012-05-22 (×2): qty 1

## 2012-05-22 MED ORDER — ASPIRIN EC 325 MG PO TBEC
325.0000 mg | DELAYED_RELEASE_TABLET | Freq: Every day | ORAL | Status: DC
Start: 2012-05-22 — End: 2012-05-23
  Administered 2012-05-22 – 2012-05-23 (×2): 325 mg via ORAL
  Filled 2012-05-22 (×2): qty 1

## 2012-05-22 MED ORDER — CLONIDINE HCL 0.1 MG PO TABS
0.1000 mg | ORAL_TABLET | Freq: Two times a day (BID) | ORAL | Status: DC
  Administered 2012-05-22 – 2012-05-23 (×3): 0.1 mg via ORAL
  Filled 2012-05-22 (×4): qty 1

## 2012-05-22 MED ORDER — AMLODIPINE BESYLATE 10 MG PO TABS
10.0000 mg | ORAL_TABLET | Freq: Every day | ORAL | Status: DC
  Administered 2012-05-22 – 2012-05-23 (×2): 10 mg via ORAL
  Filled 2012-05-22 (×2): qty 1

## 2012-05-22 MED ORDER — GLIPIZIDE ER 5 MG PO TB24
5.0000 mg | ORAL_TABLET | Freq: Every day | ORAL | Status: DC
  Administered 2012-05-22 – 2012-05-23 (×2): 5 mg via ORAL
  Filled 2012-05-22 (×2): qty 1

## 2012-05-22 MED ORDER — POTASSIUM CHLORIDE 20 MEQ PO PACK
20.0000 meq | PACK | Freq: Two times a day (BID) | ORAL | Status: DC
  Filled 2012-05-22 (×2): qty 1

## 2012-05-22 NOTE — Progress Notes (Signed)
  At [place], RN responded to pt's alarming IV pump. IV site occluded. Other site unable to flush. IV team notified. Both peripheral sites unusable and removed, per IV team. Nicardipine drip stopped d/t  lack of access. Pt adamantly refusing to allow IV team to attempt another peripheral.    CCM notified. Unable to place central line d/t tPA given within last 24hrs. Neurology paged at 0100. Dr. Doy Mince returned page. No new orders at this time, but RN to notify neurology if SBP > 180.  Per Dr. Doy Mince, RN advised pt of his high risk for bleeding into the brain, and that the risk increases with rising BP. Pt verbalized understanding. Pt continues to refuse lab draws as well, stating, "If they can't get an IV, they can't get a lab."  Pt's neuro exam unchanged. Will continue to monitor closely and update neurology as needed. Everlean Alstrom RN

## 2012-05-22 NOTE — Progress Notes (Signed)
Bilateral:  No evidence of hemodynamically significant internal carotid artery stenosis.   Vertebral artery flow is antegrade.

## 2012-05-22 NOTE — Progress Notes (Signed)
PT Cancellation Note  Treatment cancelled today due to medical issues with patient which prohibited therapy. Pt received tPa completed on 8/30 at 4:37 pm. Strict bedrest for 24 hours. Will attempt evaluation tomorrow pending medical stability.  Thanks, 05/22/2012 Ambrose Finland DPT PAGER: [phone] OFFICE: [phone]    Ambrose Finland 05/22/2012, 8:07 AM

## 2012-05-22 NOTE — Progress Notes (Signed)
Stroke Team Progress Note  HISTORY [name] who has significant stroke risk factors, who comes in with acute onset of right hemiparesis, but denied headaches, no migraine features ( even though he has a history of migraine), No nausea, no vomiting, no history of seizures. On his way his right UE got better but Right LE was still weak. Made attempt to do MRI of the brain because there was some psychogenic component but MRI scanner was not readily obtainable. Decision was made to give tPA.  Patient does not take ASA at home. 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  But nothing acute. It is less likely that his old stroke is producing his symptoms and this is not secondary to migraine either.    SUBJECTIVE No family is at bedside. The patient indicates that he feels back to baseline, completely normal. Patient denies headache. The patient is on an oral diet. The patient has refused blood draws, IV reinsertion.  OBJECTIVE Most recent Vital Signs: Temp: 97.9 F (36.6 C) (08/31 0700) Temp src: Oral (08/31 0700) BP: 195/101 mmHg (08/31 0900) Pulse Rate: 80  (08/31 0900) Respiratory Rate: 16 O2 Saturation: 100%  CBG (last 3)  Basename 05/22/12 0741 05/22/12 0424 05/21/12 2335  GLUCAP 130* 107* 163*   Intake/Output from previous day: 08/30 0701 - 08/31 0700 In: 1286.7 [P.O.:610; I.V.:676.7] Out: 1200 [Urine:1200]  IV Fluid Intake:     . sodium chloride Stopped (05/22/12 0035)  . niCARDipine Stopped (05/22/12 0035)   Medications    . alteplase  0.9 mg/kg (Order-Specific) Intravenous Once  . amLODipine  10 mg Oral Daily  . cloNIDine  0.1 mg Oral BID  . gabapentin  300 mg Oral QHS  . glipiZIDE  5 mg Oral Daily  . insulin aspart  0-9 Units Subcutaneous Q4H  . labetalol  10 mg Intravenous Once  . pantoprazole  40 mg Oral Q1200  . potassium chloride  20 mEq Oral BID  . potassium chloride  40 mEq Oral Once  . sertraline  100 mg Oral Daily  . DISCONTD:  pantoprazole (PROTONIX) IV  40 mg Intravenous QHS  . DISCONTD: pantoprazole (PROTONIX) IV  40 mg Intravenous Q24H  . DISCONTD: potassium chloride  10 mEq Intravenous Q1 Hr x 4  PRN acetaminophen, acetaminophen, labetalol, ondansetron (ZOFRAN) IV, senna-docusate  Diet:  Carb Control thin liquids Activity:  Up with assistance DVT Prophylaxis:  SCD  Significant Diagnostic Studies: CBC    Component Value Date/Time   WBC 7.1 05/21/2012 1358   RBC 4.38 05/21/2012 1358   HGB 12.9* 05/21/2012 1412   HCT 38.0* 05/21/2012 1412   PLT 180 05/21/2012 1358   MCV 85.8 05/21/2012 1358   MCH 30.4 05/21/2012 1358   MCHC 35.4 05/21/2012 1358   RDW 12.8 05/21/2012 1358   LYMPHSABS 1.9 05/21/2012 1358   MONOABS 0.6 05/21/2012 1358   EOSABS 0.1 05/21/2012 1358   BASOSABS 0.0 05/21/2012 1358   CMP    Component Value Date/Time   NA 143 05/21/2012 1412   K 3.2* 05/21/2012 1412   CL 102 05/21/2012 1412   CO2 28 05/21/2012 1358   GLUCOSE 151* 05/21/2012 1412   BUN 24* 05/21/2012 1412   CREATININE 2.10* 05/21/2012 1412   CALCIUM 8.9 05/21/2012 1358   PROT 6.3 05/21/2012 1358   ALBUMIN 2.9* 05/21/2012 1358   AST 12 05/21/2012 1358   ALT 6 05/21/2012 1358   ALKPHOS 81 05/21/2012 1358   BILITOT 0.2* 05/21/2012  [place] 05/21/2012 1358   GFRAA 36* 05/21/2012 1358   COAGS Lab Results  Component Value Date   INR 0.95 05/21/2012   INR 0.92 06/21/2011   INR 0.9 06/18/2009   Lipid Panel    Component Value Date/Time   CHOL 168 06/22/2011 0324   TRIG 47 06/22/2011 0324   HDL 56 06/22/2011 0324   CHOLHDL 3.0 06/22/2011 0324   VLDL 9 06/22/2011 0324   LDLCALC 103* 06/22/2011 0324   HgbA1C  Lab Results  Component Value Date   HGBA1C 6.0* 06/22/2011   Urine Drug Screen    Component Value Date/Time   LABOPIA NONE DETECTED 05/21/2012 2340   COCAINSCRNUR NONE DETECTED 05/21/2012 2340   LABBENZ NONE DETECTED 05/21/2012 2340   AMPHETMU NONE DETECTED 05/21/2012 2340   THCU POSITIVE* 05/21/2012 2340   LABBARB NONE DETECTED  05/21/2012 2340    Alcohol Level No results found for this basename: eth     Results for orders placed during the hospital encounter of 05/21/12 (from the past 24 hour(s))  PROTIME-INR     Status: Normal   Collection Time   05/21/12  1:58 PM      Component Value Range   Prothrombin Time 12.9  11.6 - 15.2 seconds   INR 0.95  0.00 - 1.49  APTT     Status: Normal   Collection Time   05/21/12  1:58 PM      Component Value Range   aPTT 26  24 - 37 seconds  CBC     Status: Abnormal   Collection Time   05/21/12  1:58 PM      Component Value Range   WBC 7.1  4.0 - 10.5 K/uL   RBC 4.38  4.22 - 5.81 MIL/uL   Hemoglobin 13.3  13.0 - 17.0 g/dL   HCT 37.6 (*) 39.0 - 52.0 %   MCV 85.8  78.0 - 100.0 fL   MCH 30.4  26.0 - 34.0 pg   MCHC 35.4  30.0 - 36.0 g/dL   RDW 12.8  11.5 - 15.5 %   Platelets 180  150 - 400 K/uL  DIFFERENTIAL     Status: Normal   Collection Time   05/21/12  1:58 PM      Component Value Range   Neutrophils Relative 63  43 - 77 %   Neutro Abs 4.4  1.7 - 7.7 K/uL   Lymphocytes Relative 28  12 - 46 %   Lymphs Abs 1.9  0.7 - 4.0 K/uL   Monocytes Relative 9  3 - 12 %   Monocytes Absolute 0.6  0.1 - 1.0 K/uL   Eosinophils Relative 1  0 - 5 %   Eosinophils Absolute 0.1  0.0 - 0.7 K/uL   Basophils Relative 0  0 - 1 %   Basophils Absolute 0.0  0.0 - 0.1 K/uL  COMPREHENSIVE METABOLIC PANEL     Status: Abnormal   Collection Time   05/21/12  1:58 PM      Component Value Range   Sodium 142  135 - 145 mEq/L   Potassium 3.0 (*) 3.5 - 5.1 mEq/L   Chloride 102  96 - 112 mEq/L   CO2 28  19 - 32 mEq/L   Glucose, Bld 155 (*) 70 - 99 mg/dL   BUN 25 (*) 6 - 23 mg/dL   Creatinine, Ser 2.29 (*) 0.50 - 1.35 mg/dL   Calcium 8.9  8.4 - 10.5 mg/dL   Total Protein  6.3  6.0 - 8.3 g/dL   Albumin 2.9 (*) 3.5 - 5.2 g/dL   AST 12  0 - 37 U/L   ALT 6  0 - 53 U/L   Alkaline Phosphatase 81  39 - 117 U/L   Total Bilirubin 0.2 (*) 0.3 - 1.2 mg/dL   GFR calc non Af Amer 31 (*) >90 mL/min   GFR  calc Af Amer 36 (*) >90 mL/min  CK TOTAL AND CKMB     Status: Normal   Collection Time   05/21/12  1:59 PM      Component Value Range   Total CK 76  7 - 232 U/L   CK, MB 2.8  0.3 - 4.0 ng/mL   Relative Index RELATIVE INDEX IS INVALID  0.0 - 2.5  TROPONIN I     Status: Normal   Collection Time   05/21/12  1:59 PM      Component Value Range   Troponin I <0.30  <0.30 ng/mL  POCT I-STAT, CHEM 8     Status: Abnormal   Collection Time   05/21/12  2:12 PM      Component Value Range   Sodium 143  135 - 145 mEq/L   Potassium 3.2 (*) 3.5 - 5.1 mEq/L   Chloride 102  96 - 112 mEq/L   BUN 24 (*) 6 - 23 mg/dL   Creatinine, Ser 2.10 (*) 0.50 - 1.35 mg/dL   Glucose, Bld 151 (*) 70 - 99 mg/dL   Calcium, Ion 1.13  1.12 - 1.23 mmol/L   TCO2 28  0 - 100 mmol/L   Hemoglobin 12.9 (*) 13.0 - 17.0 g/dL   HCT 38.0 (*) 39.0 - 52.0 %  GLUCOSE, CAPILLARY     Status: Abnormal   Collection Time   05/21/12  2:30 PM      Component Value Range   Glucose-Capillary 143 (*) 70 - 99 mg/dL  MRSA PCR SCREENING     Status: Normal   Collection Time   05/21/12  5:06 PM      Component Value Range   MRSA by PCR NEGATIVE  NEGATIVE  TROPONIN I     Status: Normal   Collection Time   05/21/12  5:17 PM      Component Value Range   Troponin I <0.30  <0.30 ng/mL  GLUCOSE, CAPILLARY     Status: Abnormal   Collection Time   05/21/12  7:42 PM      Component Value Range   Glucose-Capillary 149 (*) 70 - 99 mg/dL   Comment 1 Notify RN     Comment 2 Documented in Chart    GLUCOSE, CAPILLARY     Status: Abnormal   Collection Time   05/21/12 11:35 PM      Component Value Range   Glucose-Capillary 163 (*) 70 - 99 mg/dL  URINE RAPID DRUG SCREEN (HOSP PERFORMED)     Status: Abnormal   Collection Time   05/21/12 11:40 PM      Component Value Range   Opiates NONE DETECTED  NONE DETECTED   Cocaine NONE DETECTED  NONE DETECTED   Benzodiazepines NONE DETECTED  NONE DETECTED   Amphetamines NONE DETECTED  NONE DETECTED    Tetrahydrocannabinol POSITIVE (*) NONE DETECTED   Barbiturates NONE DETECTED  NONE DETECTED  URINALYSIS, ROUTINE W REFLEX MICROSCOPIC     Status: Abnormal   Collection Time   05/21/12 11:40 PM      Component Value Range   Color, Urine YELLOW  YELLOW   APPearance CLEAR  CLEAR   Specific Gravity, Urine 1.016  1.005 - 1.030   pH 5.5  5.0 - 8.0   Glucose, UA 250 (*) NEGATIVE mg/dL   Hgb urine dipstick MODERATE (*) NEGATIVE   Bilirubin Urine NEGATIVE  NEGATIVE   Ketones, ur NEGATIVE  NEGATIVE mg/dL   Protein, ur >300 (*) NEGATIVE mg/dL   Urobilinogen, UA 1.0  0.0 - 1.0 mg/dL   Nitrite NEGATIVE  NEGATIVE   Leukocytes, UA NEGATIVE  NEGATIVE  URINE MICROSCOPIC-ADD ON     Status: Abnormal   Collection Time   05/21/12 11:40 PM      Component Value Range   Squamous Epithelial / LPF RARE  RARE   WBC, UA 0-2  <3 WBC/hpf   RBC / HPF 3-6  <3 RBC/hpf   Bacteria, UA RARE  RARE   Casts HYALINE CASTS (*) NEGATIVE   Urine-Other MUCOUS PRESENT    GLUCOSE, CAPILLARY     Status: Abnormal   Collection Time   05/22/12  4:24 AM      Component Value Range   Glucose-Capillary 107 (*) 70 - 99 mg/dL  GLUCOSE, CAPILLARY     Status: Abnormal   Collection Time   05/22/12  7:41 AM      Component Value Range   Glucose-Capillary 130 (*) 70 - 99 mg/dL    Ct Head Wo Contrast  05/21/2012  *RADIOLOGY REPORT*  Clinical Data: Code stroke.  Right-sided weakness.  CT HEAD WITHOUT CONTRAST  Technique:  Contiguous axial images were obtained from the base of the skull through the vertex without contrast.  Comparison: CT 11/16/2011  Findings: Mild atrophy.  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  Negative for acute infarct.  Negative for hemorrhage or mass.  Calvarium is intact.  IMPRESSION: No acute abnormality.  Critical Value/emergent results were called by telephone at the time of interpretation on 05/21/2012 at  1415 hours to Dr. Eulis Foster, who verbally acknowledged these results.   Original Report Authenticated By:  Truett Perna, M.D.    US Renal Port  05/22/2012  *RADIOLOGY REPORT*  Clinical Data:  evaluate for hydronephrosis  RENAL/URINARY TRACT ULTRASOUND COMPLETE  Comparison:  04/28/2012.  Findings:  Right Kidney:  Measures 11.4 cm.  Increased in parenchymal echogenicity.  Cyst in the upper pole measures 1.4 x 1.9 cm.  No evidence of mass or hydronephrosis.  Left Kidney:  The left kidney measures 12.4 cm in length.  There is no evidence for hydronephrosis or nephrolithiasis.  No mass noted.  Bladder:  Within the posterior bladder there is a focal filling defect which measures 2.30 x 2.3 x 3.6 cm.  This is of uncertain etiology.  IMPRESSION:  1.  No evidence for hydronephrosis. 2.  Indeterminant filling defect within the posterior aspect of the urinary bladder.  This may be related to an enlarged prostate gland.  Urothelial neoplasm is not excluded.  Recommend follow-up with contrast enhanced CT of the head and pelvis for more definitive assessment.   Original Report Authenticated By: Angelita Ingles, M.D.    Dg Chest Port 1 View  05/22/2012  *RADIOLOGY REPORT*  Clinical Data: Edema  PORTABLE CHEST - 1 VIEW  Comparison:   the previous day's study  Findings: Heart size upper limits normal.  Low lung volumes with no focal infiltrate or overt edema.  No effusion.  Regional bones unremarkable.  IMPRESSION:  1.  Low lung volumes.  No acute disease.   Original Report Authenticated By:  D. Eddie Dibbles, M.D.    Dg Chest Port 1 View  05/21/2012  *RADIOLOGY REPORT*  Clinical Data: Right-sided weakness.  Code stroke.  PORTABLE CHEST - 1 VIEW 05/21/2012 1730 hours:  Comparison: One-view chest x-ray 04/28/2012.  Portable chest x-ray 06/21/2011, 01/07/2010.  Two-view chest x-ray 08/01/2010.  Findings: Cardiac silhouette mildly enlarged even allowing for the AP portable technique, unchanged.  Lungs clear.  Bronchovascular markings normal.  Pulmonary vascularity normal.  No pneumothorax. No pleural effusions.  IMPRESSION:  Stable mild cardiomegaly.  No acute cardiopulmonary disease.   Original Report Authenticated By: Deniece Portela, M.D.     CT of the brain    IMPRESSION:  No acute abnormality.   CT angio  not ordered  MRI of the brain  pending  MRA of the brain  pending  2D Echocardiogram  pending  Carotid Doppler  pending  CXR   IMPRESSION:  1. Low lung volumes. No acute disease.   EKG   Normal sinus rhythm Left ventricular hypertrophy Cannot rule out Anteroseptal infarct  Physical Exam   The patient is alert and cooperative at the time of examination. Pupils are equal, round, and reactive to light. Neck is supple, no carotid bruits are noted Cardiovascular examination reveals a regular rate and rhythm, no obvious murmurs or rubs are noted. Abdomen reveals positive bowel sounds, no organomegaly or tenderness is noted. Extremities are without significant edema.  Neurologic examination:  Extraocular movements are full. Visual fields are full. Speech is normal, no aphasia or dysarthria is noted. Pinprick sensation on the face is symmetric and normal. Motor examination reveals full strength of all 4 extremities. Good symmetric motor tone is noted throughout. Sensation is intact to pinprick, soft touch, and vibration sensation throughout. No evidence of extinction is noted. Cerebellar testing reveals good finger-nose-finger and heel-to-shin bilaterally. Gait was not tested. Deep tendon reflexes are symmetric and normal. Toes are downgoing bilaterally.   ASSESSMENT Richard [name] is a 56 y.o. male with a clinical TIA.   Stroke risk factors:  diabetes mellitus and hypertension. CRI on blood work.   The patient is at baseline, without residual deficits. No complaints, he wants to go home. Patient was not on aspirin prior to admission.  Hospital day # 1  TREATMENT/PLAN   -MRI brain -MRA head -2D echo -Carotid doppler -Mobilize -Aspirin after MRI if no bleed -Could  transfer to floor this afternoon -Meds for BP -replace potassium  Richard [name]

## 2012-05-22 NOTE — Progress Notes (Signed)
Name: Richard [name] MRN: [id number] DOB: 01-Feb-1956    LOS: 1  Referring Provider:  Stroke, Neuro Reason for Referral:  Cva, tpa, icu management  PULMONARY / CRITICAL CARE MEDICINE  HPI:  56 yo male smoker admitted 05/21/2012 with right sided weakness from presumed CVA, and given tPA in ED by neurology.  PCCM consulted for med management while pt in ICU. PMHx HTN, DM, Neuropathy, GERD, Migraines  Events Since Admission: 8/30- tpa, cva  Current Status: Denies headache, dizziness, chest pain.  Vital Signs: Temp:  [97.9 F (36.6 C)-98.7 F (37.1 C)] 97.9 F (36.6 C) (08/31 0700) Pulse Rate:  [69-98] 80  (08/31 0900) Resp:  [9-20] 16  (08/31 0900) BP: (143-202)/(58-107) 195/101 mmHg (08/31 0900) SpO2:  [88 %-100 %] 100 % (08/31 0900) Weight:  [168 lb 10.4 oz (76.5 kg)-180 lb (81.647 kg)] 170 lb 13.7 oz (77.5 kg) (08/31 0600)  Physical Examination: General: No distress Neuro:  Normal strength, speech fluent HEENT: No sinus tenderness Neck:  supple Cardiovascular:s1s2 regular, no murmur Lungs: No wheeze Abdomen: soft, non tender Skin: no rashes  Ct Head Wo Contrast  05/21/2012  *RADIOLOGY REPORT*  Clinical Data: Code stroke.  Right-sided weakness.  CT HEAD WITHOUT CONTRAST  Technique:  Contiguous axial images were obtained from the base of the skull through the vertex without contrast.  Comparison: CT 11/16/2011  Findings: Mild atrophy.  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  Negative for acute infarct.  Negative for hemorrhage or mass.  Calvarium is intact.  IMPRESSION: No acute abnormality.  Critical Value/emergent results were called by telephone at the time of interpretation on 05/21/2012 at  1415 hours to Dr. Eulis Foster, who verbally acknowledged these results.   Original Report Authenticated By: Truett Perna, M.D.    US Renal Port  05/22/2012  *RADIOLOGY REPORT*  Clinical Data:  evaluate for hydronephrosis  RENAL/URINARY TRACT ULTRASOUND COMPLETE  Comparison:   04/28/2012.  Findings:  Right Kidney:  Measures 11.4 cm.  Increased in parenchymal echogenicity.  Cyst in the upper pole measures 1.4 x 1.9 cm.  No evidence of mass or hydronephrosis.  Left Kidney:  The left kidney measures 12.4 cm in length.  There is no evidence for hydronephrosis or nephrolithiasis.  No mass noted.  Bladder:  Within the posterior bladder there is a focal filling defect which measures 2.30 x 2.3 x 3.6 cm.  This is of uncertain etiology.  IMPRESSION:  1.  No evidence for hydronephrosis. 2.  Indeterminant filling defect within the posterior aspect of the urinary bladder.  This may be related to an enlarged prostate gland.  Urothelial neoplasm is not excluded.  Recommend follow-up with contrast enhanced CT of the head and pelvis for more definitive assessment.   Original Report Authenticated By: Angelita Ingles, M.D.    Dg Chest Port 1 View  05/22/2012  *RADIOLOGY REPORT*  Clinical Data: Edema  PORTABLE CHEST - 1 VIEW  Comparison:   the previous day's study  Findings: Heart size upper limits normal.  Low lung volumes with no focal infiltrate or overt edema.  No effusion.  Regional bones unremarkable.  IMPRESSION:  1.  Low lung volumes.  No acute disease.   Original Report Authenticated By: Trecia Rogers, M.D.    Dg Chest Port 1 View  05/21/2012  *RADIOLOGY REPORT*  Clinical Data: Right-sided weakness.  Code stroke.  PORTABLE CHEST - 1 VIEW 05/21/2012 1730 hours:  Comparison: One-view chest x-ray 04/28/2012.  Portable chest x-ray 06/21/2011, 01/07/2010.  Two-view  chest x-ray 08/01/2010.  Findings: Cardiac silhouette mildly enlarged even allowing for the AP portable technique, unchanged.  Lungs clear.  Bronchovascular markings normal.  Pulmonary vascularity normal.  No pneumothorax. No pleural effusions.  IMPRESSION: Stable mild cardiomegaly.  No acute cardiopulmonary disease.   Original Report Authenticated By: Deniece Portela, M.D.    ASSESSMENT AND PLAN  PULMONARY  A:  H/o  smoking P:   Needs education for smoking cessation  CARDIOVASCULAR  Lines:  A: HTN. P:  Resume oral anti-HTN meds per neurology F/u Echo  RENAL  Lab 05/21/12 1412 05/21/12 1358  NA 143 142  K 3.2* 3.0*  CL 102 102  CO2 -- 28  BUN 24* 25*  CREATININE 2.10* 2.29*  CALCIUM -- 8.9  MG -- --  PHOS -- --   Intake/Output      08/30 0701 - 08/31 0700 08/31 0701 - 09/01 0700   P.O. 610    I.V. (mL/kg) 676.7 (8.7)    Total Intake(mL/kg) 1286.7 (16.6)    Urine (mL/kg/hr) 1200 (0.6)    Total Output 1200    Net +86.7          Foley:    A: Acute on chronic renal insufficiency (baseline creatine 1.7 from 04/28/12).  Renal u/s 8/30>>?BPH. P:   F/u renal fx Control BP May need eval by nephrology if no further improvement  GASTROINTESTINAL  Lab 05/21/12 1358  AST 12  ALT 6  ALKPHOS 81  BILITOT 0.2*  PROT 6.3  ALBUMIN 2.9*    A: GERD. Nutrition. P:   Continue diet PPI  HEMATOLOGIC  Lab 05/21/12 1412 05/21/12 1358  HGB 12.9* 13.3  HCT 38.0* 37.6*  PLT -- 180  INR -- 0.95  APTT -- 26   A: No acute issues. P:  F/u CBC intermittently  INFECTIOUS  Lab 05/21/12 1358  WBC 7.1  PROCALCITON --   Cultures:  Antibiotics:  A:  No evidence infection P:   Monitor temp curve, wbc in am   ENDOCRINE  Lab 05/22/12 0741 05/22/12 0424 05/21/12 2335 05/21/12 1942 05/21/12 1430  GLUCAP 130* 107* 163* 149* 143*   A:  DM P:   SSI Can transition back to oral meds>>defer to primary team  NEUROLOGIC  A:  S/p TPA for CVA P:   Stroke w/u per neuroloyg  BEST PRACTICE / DISPOSITION Level of Care:  ICU Primary Service:  neuro Consultants:  pccm Code Status:  full Diet:  CHO modified DVT Px:  scd GI Px:  ppi Skin Integrity:  wnl Social / Family:  None at bedside  PCCM will sign off.  Please call if additional help needed.  Chesley Mires, MD [facility] Pulmonary/Critical Care 05/22/2012, 9:19 AM Pager:  [phone] After 3pm call: [phone]

## 2012-05-22 NOTE — Progress Notes (Signed)
Pt settled into rm [id number], explained that he was in a video monitored room, and explained about the bed alarm and to call for assistance when needing to get out of bed. Pt with no questions. No deficits noted on neuro exam. Tele connected, NSR.  Pt hopefull to go home tomorrow. Call bell in reach. Will cont to monitor   Minor, Dirk Dress

## 2012-05-23 DIAGNOSIS — I517 Cardiomegaly: Secondary | ICD-10-CM

## 2012-05-23 LAB — CBC
Hemoglobin: 12.2 g/dL — ABNORMAL LOW (ref 13.0–17.0)
MCV: 86.2 fL (ref 78.0–100.0)
Platelets: 153 10*3/uL (ref 150–400)
RBC: 3.98 MIL/uL — ABNORMAL LOW (ref 4.22–5.81)
WBC: 5.8 10*3/uL (ref 4.0–10.5)

## 2012-05-23 LAB — BASIC METABOLIC PANEL
CO2: 29 mEq/L (ref 19–32)
Calcium: 8.8 mg/dL (ref 8.4–10.5)
Glucose, Bld: 146 mg/dL — ABNORMAL HIGH (ref 70–99)
Potassium: 3.6 mEq/L (ref 3.5–5.1)
Sodium: 139 mEq/L (ref 135–145)

## 2012-05-23 LAB — GLUCOSE, CAPILLARY
Glucose-Capillary: 118 mg/dL — ABNORMAL HIGH (ref 70–99)
Glucose-Capillary: 124 mg/dL — ABNORMAL HIGH (ref 70–99)

## 2012-05-23 MED ORDER — ASPIRIN 325 MG PO TBEC: 325.0000 mg | DELAYED_RELEASE_TABLET | Freq: Every day | ORAL | Status: AC

## 2012-05-23 NOTE — Progress Notes (Signed)
Physical Therapy Evaluation Patient Details Name: Richard [name] MRN: [id number] DOB: Aug 20, 1956 Today's Date: 05/23/2012 Time: [id number] PT Time Calculation (min): 18 min  PT Assessment / Plan / Recommendation Clinical Impression  56 yo male admitted with symptoms of CVA, now back to symmetrical strength and independent with all mobility; Discussed risk factors of SVA, and pt seems amenable to quitting smoking -- he sees this episode as a wake up call; No further PT needs noted; will sign off    PT Assessment  Patent does not need any further PT services    Follow Up Recommendations  No PT follow up    Barriers to Discharge        Equipment Recommendations  None recommended by PT    Recommendations for Other Services     Frequency      Precautions / Restrictions Precautions Precautions: None Restrictions Other Position/Activity Restrictions: BP runs high   Pertinent Vitals/Pain BP elevated post steps; 205/101 RN aware; had not gotten daily BP meds yet       Mobility  Bed Mobility Bed Mobility: Supine to Sit Supine to Sit: 7: Independent Transfers Transfers: Sit to Stand;Stand to Sit Sit to Stand: 7: Independent Stand to Sit: 7: Independent Ambulation/Gait Ambulation/Gait Assistance: 7: Independent Ambulation Distance (Feet): 80 Feet Assistive device: None Ambulation/Gait Assistance Details: Cues to self monitor for HA, dizziness Gait Pattern: Within Functional Limits Stairs: Yes Stairs Assistance: 6: Modified independent (Device/Increase time) Stair Management Technique: One rail Right;Forwards Number of Stairs: 12  Modified Rankin (Stroke Patients Only) Pre-Morbid Rankin Score: No symptoms Modified Rankin: No symptoms (on PT eval after receiving tPA)    Exercises     PT Diagnosis:    PT Problem List:   PT Treatment Interventions:     PT Goals    Visit Information  Last PT Received On: 05/23/12 Assistance Needed: +1    Subjective Data  Subjective: REALLY wanting to go home Patient Stated Goal: to go home; Seems open to quitting smoking   Prior Functioning  Home Living Lives With: Significant other Available Help at Discharge: Family Type of Home: Apartment Home Layout: One level Prior Function Level of Independence: Independent Able to Take Stairs?: Yes Driving: Yes Vocation: Full time employment Communication Communication: No difficulties    Cognition  Overall Cognitive Status: Appears within functional limits for tasks assessed/performed Arousal/Alertness: Awake/alert Orientation Level: Appears intact for tasks assessed Behavior During Session: [facility] for tasks performed    Extremity/Trunk Assessment Right Upper Extremity Assessment RUE ROM/Strength/Tone: [facility] for tasks assessed Left Upper Extremity Assessment LUE ROM/Strength/Tone: [facility] for tasks assessed Right Lower Extremity Assessment RLE ROM/Strength/Tone: [facility] for tasks assessed Left Lower Extremity Assessment LLE ROM/Strength/Tone: [facility] for tasks assessed Trunk Assessment Trunk Assessment: Normal   Balance    End of Session PT - End of Session Activity Tolerance: Patient tolerated treatment well;Other (comment) (very elevated BP with activity; had not gotten BP meds) Patient left: in bed;with call bell/phone within reach;with family/visitor present Nurse Communication: Mobility status (Elevated BP)  GP     Quin Hoop [place], [place]  05/23/2012, 12:03 PM

## 2012-05-23 NOTE — Discharge Summary (Signed)
This is a 56 y/o RH AAM, presented with acute onset of right hemiparesis 08/30, On his way his right UE got better but Right LE was still weak. Made attempt to do MRI of the brain because there was some psychogenic component but MRI scanner was not readily obtainable. Decision was made to give tPA, received 05/21/2012.  Patient does not take ASA at home. 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  MRI showed chronic small vessel disease, no new event    The patient indicates that he feels back to baseline, completely normal. Patient denies headache. The patient is on an oral diet. The patient has refused blood draws, IV reinsertion.   OBJECTIVE  Most recent Vital Signs:  Temp: 97.8 F (36.6 C) (09/01 0942)  Temp src: Oral (09/01 0942)  BP: 184/90 mmHg (09/01 0942)  Pulse Rate: 74 (09/01 0942)  Respiratory Rate: 18  O2 Saturation: 100%  CBG (last 3)   Basename  05/23/12 1111  05/23/12 0754  05/23/12 0426   GLUCAP  142*  124*  118*    Intake/Output from previous day:  08/31 0701 - 09/01 0700  In: 240 [P.O.:240]  Out: 175 [Urine:175]  IV Fluid Intake:   .  DISCONTD: sodium chloride  Stopped (05/22/12 0035)   .  DISCONTD: niCARDipine  Stopped (05/22/12 0035)    Medications   .  amLODipine  10 mg  Oral  Daily   .  aspirin EC  325 mg  Oral  Daily   .  cloNIDine  0.1 mg  Oral  BID   .  cloNIDine  0.1 mg  Oral  Once   .  gabapentin  300 mg  Oral  QHS   .  glipiZIDE  5 mg  Oral  Daily   .  insulin aspart  0-9 Units  Subcutaneous  Q4H   .  pantoprazole  40 mg  Oral  Q1200   .  potassium chloride  20 mEq  Oral  BID   .  sertraline  100 mg  Oral  Daily   PRN acetaminophen, acetaminophen, ondansetron (ZOFRAN) IV, senna-docusate, DISCONTD: labetalol  Diet: Carb Control thin liquids  Activity: Up with assistance  DVT Prophylaxis: SCD  Significant Diagnostic Studies:  CBC    Component  Value  Date/Time    WBC  5.8  05/23/2012 0519    RBC  3.98*  05/23/2012 0519    HGB  12.2*   05/23/2012 0519    HCT  34.3*  05/23/2012 0519    PLT  153  05/23/2012 0519    MCV  86.2  05/23/2012 0519    MCH  30.7  05/23/2012 0519    MCHC  35.6  05/23/2012 0519    RDW  12.9  05/23/2012 0519    LYMPHSABS  1.9  05/21/2012 1358    MONOABS  0.6  05/21/2012 1358    EOSABS  0.1  05/21/2012 1358    BASOSABS  0.0  05/21/2012 1358    CMP    Component  Value  Date/Time    NA  139  05/23/2012 0519    K  3.6  05/23/2012 0519    CL  104  05/23/2012 0519    CO2  29  05/23/2012 0519    GLUCOSE  146*  05/23/2012 0519    BUN  22  05/23/2012 0519    CREATININE  1.79*  05/23/2012 0519    CALCIUM  8.8  05/23/2012 0519  PROT  6.3  05/21/2012 1358    ALBUMIN  2.9*  05/21/2012 1358    AST  12  05/21/2012 1358    ALT  6  05/21/2012 1358    ALKPHOS  81  05/21/2012 1358    BILITOT  0.2*  05/21/2012 1358    GFRNONAA  41*  05/23/2012 0519    GFRAA  48*  05/23/2012 0519    COAGS  Lab Results   Component  Value  Date    INR  0.95  05/21/2012    INR  0.92  06/21/2011    INR  0.9  06/18/2009    Lipid Panel    Component  Value  Date/Time    CHOL  168  06/22/2011 0324    TRIG  47  06/22/2011 0324    HDL  56  06/22/2011 0324    CHOLHDL  3.0  06/22/2011 0324    VLDL  9  06/22/2011 0324    LDLCALC  103*  06/22/2011 0324    HgbA1C  Lab Results   Component  Value  Date    HGBA1C  6.0*  06/22/2011    Urine Drug Screen   Component  Value  Date/Time    LABOPIA  NONE DETECTED  05/21/2012 2340    COCAINSCRNUR  NONE DETECTED  05/21/2012 2340    LABBENZ  NONE DETECTED  05/21/2012 2340    AMPHETMU  NONE DETECTED  05/21/2012 2340    THCU  POSITIVE*  05/21/2012 2340    LABBARB  NONE DETECTED  05/21/2012 2340    Alcohol Level  No results found for this basename: eth    Results for orders placed during the hospital encounter of 05/21/12 (from the past 24 hour(s))   GLUCOSE, CAPILLARY Status: Abnormal    Collection Time    05/22/12 5:42 PM   Component  Value  Range    Glucose-Capillary  126 (*)  70 - 99 mg/dL    Comment 1  Documented in Chart      Comment 2  Notify RN    GLUCOSE, CAPILLARY Status: Abnormal    Collection Time    05/22/12 8:47 PM   Component  Value  Range    Glucose-Capillary  155 (*)  70 - 99 mg/dL   GLUCOSE, CAPILLARY Status: Abnormal    Collection Time    05/23/12 12:20 AM   Component  Value  Range    Glucose-Capillary  134 (*)  70 - 99 mg/dL   GLUCOSE, CAPILLARY Status: Abnormal    Collection Time    05/23/12 4:26 AM   Component  Value  Range    Glucose-Capillary  118 (*)  70 - 99 mg/dL   BASIC METABOLIC PANEL Status: Abnormal    Collection Time    05/23/12 5:19 AM   Component  Value  Range    Sodium  139  135 - 145 mEq/L    Potassium  3.6  3.5 - 5.1 mEq/L    Chloride  104  96 - 112 mEq/L    CO2  29  19 - 32 mEq/L    Glucose, Bld  146 (*)  70 - 99 mg/dL    BUN  22  6 - 23 mg/dL    Creatinine, Ser  1.79 (*)  0.50 - 1.35 mg/dL    Calcium  8.8  8.4 - 10.5 mg/dL    GFR calc non Af Amer  41 (*)  >90 mL/min    GFR calc Af  Amer  48 (*)  >90 mL/min   CBC Status: Abnormal    Collection Time    05/23/12 5:19 AM   Component  Value  Range    WBC  5.8  4.0 - 10.5 K/uL    RBC  3.98 (*)  4.22 - 5.81 MIL/uL    Hemoglobin  12.2 (*)  13.0 - 17.0 g/dL    HCT  34.3 (*)  39.0 - 52.0 %    MCV  86.2  78.0 - 100.0 fL    MCH  30.7  26.0 - 34.0 pg    MCHC  35.6  30.0 - 36.0 g/dL    RDW  12.9  11.5 - 15.5 %    Platelets  153  150 - 400 K/uL   GLUCOSE, CAPILLARY Status: Abnormal    Collection Time    05/23/12 7:54 AM   Component  Value  Range    Glucose-Capillary  124 (*)  70 - 99 mg/dL   GLUCOSE, CAPILLARY Status: Abnormal    Collection Time    05/23/12 11:11 AM   Component  Value  Range    Glucose-Capillary  142 (*)  70 - 99 mg/dL    Comment 1  Documented in Chart     Comment 2  Notify RN     Ct Head Wo Contrast  05/21/2012 CT HEAD WITHOUT CONTRAST No acute abnormality.  2D Echocardiogram:  Left ventricle: Wall thickness was increased in a pattern of mild LVH. There was moderate concentric hypertrophy. Systolic function  was normal. The estimated ejection fraction was in the range of 55% to 60%. Wall motion was Normal;  Carotid Doppler No significant stenosis.  EKG  Normal sinus rhythm  Physical Exam  The patient is alert and cooperative at the time of examination.  Pupils are equal, round, and reactive to light.  Neck is supple, no carotid bruits are noted  Cardiovascular examination reveals a regular rate and rhythm,  Neurologic examination:  Extraocular movements are full. Visual fields are full. Speech is normal, no aphasia or dysarthria is noted.  Motor examination reveals full strength of all 4 extremities.  Sensation is intact to touch, No evidence of extinction is noted.  Cerebellar testing reveals good finger-nose-finger and heel-to-shin bilaterally. Gait was not tested.  Deep tendon reflexes are symmetric and normal. Toes are downgoing bilaterally.   ASSESSMENT  Mr. Richard [name] is a 56 y.o. male with a clinical TIA. S/p TPA, no MRI acute findings. Stroke risk factors: diabetes mellitus and hypertension. CRI on blood work.  The patient is at baseline, without residual deficits. No complaints, work up completed, Discharge home. daily ASA  -continue follow up with PCP to stress vascular risk factor.

## 2012-05-23 NOTE — Progress Notes (Signed)
Stroke Team Progress Note  HISTORY This is a 56 y/o RH AAM, presented with acute onset of right hemiparesis 08/30,  On his way his right UE got better but Right LE was still weak. Made attempt to do MRI of the brain because there was some psychogenic component but MRI scanner was not readily obtainable. Decision was made to give tPA, received 05/21/2012.   Patient does not take ASA at home. 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  MRI showed chronic small vessel disease, no new event   SUBJECTIVE Wife is at bedside. The patient indicates that he feels back to baseline, completely normal. Patient denies headache. The patient is on an oral diet. The patient has refused blood draws, IV reinsertion.  OBJECTIVE Most recent Vital Signs: Temp: 97.8 F (36.6 C) (09/01 0942) Temp src: Oral (09/01 0942) BP: 184/90 mmHg (09/01 0942) Pulse Rate: 74  (09/01 0942) Respiratory Rate: 18 O2 Saturation: 100%  CBG (last 3)   Basename 05/23/12 1111 05/23/12 0754 05/23/12 0426  GLUCAP 142* 124* 118*   Intake/Output from previous day: 08/31 0701 - 09/01 0700 In: 240 [P.O.:240] Out: 175 [Urine:175]  IV Fluid Intake:      . DISCONTD: sodium chloride Stopped (05/22/12 0035)  . DISCONTD: niCARDipine Stopped (05/22/12 0035)   Medications     . amLODipine  10 mg Oral Daily  . aspirin EC  325 mg Oral Daily  . cloNIDine  0.1 mg Oral BID  . cloNIDine  0.1 mg Oral Once  . gabapentin  300 mg Oral QHS  . glipiZIDE  5 mg Oral Daily  . insulin aspart  0-9 Units Subcutaneous Q4H  . pantoprazole  40 mg Oral Q1200  . potassium chloride  20 mEq Oral BID  . sertraline  100 mg Oral Daily  PRN acetaminophen, acetaminophen, ondansetron (ZOFRAN) IV, senna-docusate, DISCONTD: labetalol  Diet:  Carb Control thin liquids Activity:  Up with assistance DVT Prophylaxis:  SCD  Significant Diagnostic Studies: CBC    Component Value Date/Time   WBC 5.8 05/23/2012 0519   RBC 3.98* 05/23/2012 0519   HGB 12.2* 05/23/2012 0519   HCT 34.3* 05/23/2012 0519   PLT 153 05/23/2012 0519   MCV 86.2 05/23/2012 0519   MCH 30.7 05/23/2012 0519   MCHC 35.6 05/23/2012 0519   RDW 12.9 05/23/2012 0519   LYMPHSABS 1.9 05/21/2012 1358   MONOABS 0.6 05/21/2012 1358   EOSABS 0.1 05/21/2012 1358   BASOSABS 0.0 05/21/2012 1358   CMP    Component Value Date/Time   NA 139 05/23/2012 0519   K 3.6 05/23/2012 0519   CL 104 05/23/2012 0519   CO2 29 05/23/2012 0519   GLUCOSE 146* 05/23/2012 0519   BUN 22 05/23/2012 0519   CREATININE 1.79* 05/23/2012 0519   CALCIUM 8.8 05/23/2012 0519   PROT 6.3 05/21/2012 1358   ALBUMIN 2.9* 05/21/2012 1358   AST 12 05/21/2012 1358   ALT 6 05/21/2012 1358   ALKPHOS 81 05/21/2012 1358   BILITOT 0.2* 05/21/2012 1358   GFRNONAA 41* 05/23/2012 0519   GFRAA 48* 05/23/2012 0519   COAGS Lab Results  Component Value Date   INR 0.95 05/21/2012   INR 0.92 06/21/2011   INR 0.9 06/18/2009   Lipid Panel    Component Value Date/Time   CHOL 168 06/22/2011 0324   TRIG 47 06/22/2011 0324   HDL 56 06/22/2011 0324   CHOLHDL 3.0 06/22/2011 0324   VLDL 9 06/22/2011 0324   LDLCALC 103*  06/22/2011 0324   HgbA1C  Lab Results  Component Value Date   HGBA1C 6.0* 06/22/2011   Urine Drug Screen    Component Value Date/Time   LABOPIA NONE DETECTED 05/21/2012 2340   COCAINSCRNUR NONE DETECTED 05/21/2012 2340   LABBENZ NONE DETECTED 05/21/2012 2340   AMPHETMU NONE DETECTED 05/21/2012 2340   THCU POSITIVE* 05/21/2012 2340   LABBARB NONE DETECTED 05/21/2012 2340    Alcohol Level No results found for this basename: eth     Results for orders placed during the hospital encounter of 05/21/12 (from the past 24 hour(s))  GLUCOSE, CAPILLARY     Status: Abnormal   Collection Time   05/22/12  5:42 PM      Component Value Range   Glucose-Capillary 126 (*) 70 - 99 mg/dL   Comment 1 Documented in Chart     Comment 2 Notify RN    GLUCOSE, CAPILLARY     Status: Abnormal   Collection Time   05/22/12  8:47 PM      Component Value Range    Glucose-Capillary 155 (*) 70 - 99 mg/dL  GLUCOSE, CAPILLARY     Status: Abnormal   Collection Time   05/23/12 12:20 AM      Component Value Range   Glucose-Capillary 134 (*) 70 - 99 mg/dL  GLUCOSE, CAPILLARY     Status: Abnormal   Collection Time   05/23/12  4:26 AM      Component Value Range   Glucose-Capillary 118 (*) 70 - 99 mg/dL  BASIC METABOLIC PANEL     Status: Abnormal   Collection Time   05/23/12  5:19 AM      Component Value Range   Sodium 139  135 - 145 mEq/L   Potassium 3.6  3.5 - 5.1 mEq/L   Chloride 104  96 - 112 mEq/L   CO2 29  19 - 32 mEq/L   Glucose, Bld 146 (*) 70 - 99 mg/dL   BUN 22  6 - 23 mg/dL   Creatinine, Ser 1.79 (*) 0.50 - 1.35 mg/dL   Calcium 8.8  8.4 - 10.5 mg/dL   GFR calc non Af Amer 41 (*) >90 mL/min   GFR calc Af Amer 48 (*) >90 mL/min  CBC     Status: Abnormal   Collection Time   05/23/12  5:19 AM      Component Value Range   WBC 5.8  4.0 - 10.5 K/uL   RBC 3.98 (*) 4.22 - 5.81 MIL/uL   Hemoglobin 12.2 (*) 13.0 - 17.0 g/dL   HCT 34.3 (*) 39.0 - 52.0 %   MCV 86.2  78.0 - 100.0 fL   MCH 30.7  26.0 - 34.0 pg   MCHC 35.6  30.0 - 36.0 g/dL   RDW 12.9  11.5 - 15.5 %   Platelets 153  150 - 400 K/uL  GLUCOSE, CAPILLARY     Status: Abnormal   Collection Time   05/23/12  7:54 AM      Component Value Range   Glucose-Capillary 124 (*) 70 - 99 mg/dL  GLUCOSE, CAPILLARY     Status: Abnormal   Collection Time   05/23/12 11:11 AM      Component Value Range   Glucose-Capillary 142 (*) 70 - 99 mg/dL   Comment 1 Documented in Chart     Comment 2 Notify RN      Ct Head Wo Contrast  05/21/2012   CT HEAD WITHOUT CONTRAST    No acute abnormality.  2D Echocardiogram  pending  Carotid Doppler  No significant stenosis.     EKG   Normal sinus rhythm    Physical Exam   The patient is alert and cooperative at the time of examination. Pupils are equal, round, and reactive to light. Neck is supple, no carotid bruits are noted Cardiovascular  examination reveals a regular rate and rhythm,   Neurologic examination:  Extraocular movements are full. Visual fields are full. Speech is normal, no aphasia or dysarthria is noted.  Motor examination reveals full strength of all 4 extremities.   Sensation is intact to  touch,   No evidence of extinction is noted. Cerebellar testing reveals good finger-nose-finger and heel-to-shin bilaterally. Gait was not tested. Deep tendon reflexes are symmetric and normal. Toes are downgoing bilaterally.   ASSESSMENT Mr. Voltaire Lawrance is a 56 y.o. male with a clinical TIA.   Stroke risk factors:  diabetes mellitus and hypertension. CRI on blood work.   The patient is at baseline, without residual deficits. No complaints, he wants to go home. Patient was not on aspirin prior to admission.  Hospital day # 2  TREATMENT/PLAN   -if ECHO showed no abnormalities, -It is OK to discharge to home with ASA -continue follow up with PCP to stress vascular risk factor. -neurology will sign off.  Marcial Pacas

## 2012-05-23 NOTE — Progress Notes (Signed)
Pharmacy:  Stroke Core Measures Documentation Pt refused to have lipid panel drawn so it was cancelled by MD on 05/22/12.  Eudelia Bunch, Pharm.D. [id number] 05/23/2012 1:40 PM

## 2012-05-23 NOTE — Progress Notes (Signed)
  Echocardiogram 2D Echocardiogram has been performed.  Richard [name] 05/23/2012, 12:40 PM

## 2012-08-23 ENCOUNTER — Emergency Department (HOSPITAL_COMMUNITY): Payer: Non-veteran care

## 2012-08-23 ENCOUNTER — Inpatient Hospital Stay (HOSPITAL_COMMUNITY)
Admission: EM | Admit: 2012-08-23 | Discharge: 2012-08-25 | DRG: 065 | Disposition: A | Discharge status: Rehabilitation Facility | Payer: Non-veteran care | Attending: Internal Medicine | Admitting: Internal Medicine

## 2012-08-23 ENCOUNTER — Inpatient Hospital Stay (HOSPITAL_COMMUNITY): Payer: Non-veteran care

## 2012-08-23 ENCOUNTER — Encounter (HOSPITAL_COMMUNITY): Payer: Self-pay | Admitting: *Deleted

## 2012-08-23 DIAGNOSIS — Z720 Tobacco use: Secondary | ICD-10-CM | POA: Diagnosis present

## 2012-08-23 DIAGNOSIS — Z91199 Patient's noncompliance with other medical treatment and regimen due to unspecified reason: Secondary | ICD-10-CM

## 2012-08-23 DIAGNOSIS — I672 Cerebral atherosclerosis: Secondary | ICD-10-CM | POA: Diagnosis present

## 2012-08-23 DIAGNOSIS — I6330 Cerebral infarction due to thrombosis of unspecified cerebral artery: Principal | ICD-10-CM | POA: Diagnosis present

## 2012-08-23 DIAGNOSIS — Z8673 Personal history of transient ischemic attack (TIA), and cerebral infarction without residual deficits: Secondary | ICD-10-CM

## 2012-08-23 DIAGNOSIS — F17200 Nicotine dependence, unspecified, uncomplicated: Secondary | ICD-10-CM | POA: Diagnosis present

## 2012-08-23 DIAGNOSIS — I10 Essential (primary) hypertension: Secondary | ICD-10-CM

## 2012-08-23 DIAGNOSIS — Z6825 Body mass index (BMI) 25.0-25.9, adult: Secondary | ICD-10-CM

## 2012-08-23 DIAGNOSIS — Z9119 Patient's noncompliance with other medical treatment and regimen: Secondary | ICD-10-CM

## 2012-08-23 DIAGNOSIS — I161 Hypertensive emergency: Secondary | ICD-10-CM

## 2012-08-23 DIAGNOSIS — N184 Chronic kidney disease, stage 4 (severe): Secondary | ICD-10-CM | POA: Diagnosis present

## 2012-08-23 DIAGNOSIS — E1122 Type 2 diabetes mellitus with diabetic chronic kidney disease: Secondary | ICD-10-CM | POA: Diagnosis present

## 2012-08-23 DIAGNOSIS — Z794 Long term (current) use of insulin: Secondary | ICD-10-CM

## 2012-08-23 DIAGNOSIS — E441 Mild protein-calorie malnutrition: Secondary | ICD-10-CM | POA: Diagnosis present

## 2012-08-23 DIAGNOSIS — I129 Hypertensive chronic kidney disease with stage 1 through stage 4 chronic kidney disease, or unspecified chronic kidney disease: Secondary | ICD-10-CM | POA: Diagnosis present

## 2012-08-23 DIAGNOSIS — E119 Type 2 diabetes mellitus without complications: Secondary | ICD-10-CM | POA: Diagnosis present

## 2012-08-23 DIAGNOSIS — E440 Moderate protein-calorie malnutrition: Secondary | ICD-10-CM | POA: Diagnosis present

## 2012-08-23 DIAGNOSIS — N183 Chronic kidney disease, stage 3 unspecified: Secondary | ICD-10-CM

## 2012-08-23 DIAGNOSIS — M129 Arthropathy, unspecified: Secondary | ICD-10-CM | POA: Diagnosis present

## 2012-08-23 DIAGNOSIS — I639 Cerebral infarction, unspecified: Secondary | ICD-10-CM

## 2012-08-23 DIAGNOSIS — F1210 Cannabis abuse, uncomplicated: Secondary | ICD-10-CM | POA: Diagnosis present

## 2012-08-23 DIAGNOSIS — G589 Mononeuropathy, unspecified: Secondary | ICD-10-CM | POA: Diagnosis present

## 2012-08-23 DIAGNOSIS — Z79899 Other long term (current) drug therapy: Secondary | ICD-10-CM

## 2012-08-23 DIAGNOSIS — G8190 Hemiplegia, unspecified affecting unspecified side: Secondary | ICD-10-CM | POA: Diagnosis present

## 2012-08-23 DIAGNOSIS — E785 Hyperlipidemia, unspecified: Secondary | ICD-10-CM | POA: Diagnosis present

## 2012-08-23 DIAGNOSIS — K219 Gastro-esophageal reflux disease without esophagitis: Secondary | ICD-10-CM | POA: Diagnosis present

## 2012-08-23 DIAGNOSIS — Z7982 Long term (current) use of aspirin: Secondary | ICD-10-CM

## 2012-08-23 DIAGNOSIS — R29898 Other symptoms and signs involving the musculoskeletal system: Secondary | ICD-10-CM | POA: Diagnosis present

## 2012-08-23 DIAGNOSIS — E1129 Type 2 diabetes mellitus with other diabetic kidney complication: Secondary | ICD-10-CM

## 2012-08-23 HISTORY — DX: Cerebral infarction, unspecified: I63.9

## 2012-08-23 LAB — COMPREHENSIVE METABOLIC PANEL
AST: 13 U/L (ref 0–37)
Albumin: 3.3 g/dL — ABNORMAL LOW (ref 3.5–5.2)
Calcium: 9.8 mg/dL (ref 8.4–10.5)
Chloride: 101 mEq/L (ref 96–112)
Creatinine, Ser: 2.16 mg/dL — ABNORMAL HIGH (ref 0.50–1.35)
Total Protein: 7.3 g/dL (ref 6.0–8.3)

## 2012-08-23 LAB — URINALYSIS, ROUTINE W REFLEX MICROSCOPIC
Glucose, UA: 250 mg/dL — AB
Leukocytes, UA: NEGATIVE
Specific Gravity, Urine: 1.022 (ref 1.005–1.030)
pH: 6 (ref 5.0–8.0)

## 2012-08-23 LAB — CBC WITH DIFFERENTIAL/PLATELET
Basophils Absolute: 0 10*3/uL (ref 0.0–0.1)
Basophils Relative: 1 % (ref 0–1)
Eosinophils Absolute: 0.1 10*3/uL (ref 0.0–0.7)
Eosinophils Relative: 1 % (ref 0–5)
HCT: 43.1 % (ref 39.0–52.0)
MCHC: 36.2 g/dL — ABNORMAL HIGH (ref 30.0–36.0)
Monocytes Absolute: 0.5 10*3/uL (ref 0.1–1.0)
Neutro Abs: 4 10*3/uL (ref 1.7–7.7)
RDW: 12.9 % (ref 11.5–15.5)

## 2012-08-23 LAB — PROTIME-INR
INR: 0.89 (ref 0.00–1.49)
Prothrombin Time: 12 seconds (ref 11.6–15.2)

## 2012-08-23 LAB — GLUCOSE, CAPILLARY: Glucose-Capillary: 176 mg/dL — ABNORMAL HIGH (ref 70–99)

## 2012-08-23 LAB — RAPID URINE DRUG SCREEN, HOSP PERFORMED
Amphetamines: NOT DETECTED
Barbiturates: NOT DETECTED
Benzodiazepines: NOT DETECTED
Cocaine: NOT DETECTED

## 2012-08-23 LAB — MRSA PCR SCREENING: MRSA by PCR: NEGATIVE

## 2012-08-23 LAB — URINE MICROSCOPIC-ADD ON

## 2012-08-23 MED ORDER — NICARDIPINE HCL IN NACL 20-0.86 MG/200ML-% IV SOLN
5.0000 mg/h | INTRAVENOUS | Status: DC
  Filled 2012-08-23: qty 200

## 2012-08-23 MED ORDER — SODIUM CHLORIDE 0.9 % IV SOLN: 250.0000 mL | INTRAVENOUS | Status: DC | PRN

## 2012-08-23 MED ORDER — NICOTINE 21 MG/24HR TD PT24
21.0000 mg | MEDICATED_PATCH | Freq: Every day | TRANSDERMAL | Status: DC
  Filled 2012-08-23 (×3): qty 1

## 2012-08-23 MED ORDER — LABETALOL HCL 5 MG/ML IV SOLN
5.0000 mg | INTRAVENOUS | Status: DC | PRN
  Administered 2012-08-23 (×2): 5 mg via INTRAVENOUS
  Filled 2012-08-23: qty 4

## 2012-08-23 MED ORDER — LABETALOL HCL 5 MG/ML IV SOLN
10.0000 mg | INTRAVENOUS | Status: DC | PRN
  Administered 2012-08-23 (×2): 10 mg via INTRAVENOUS
  Filled 2012-08-23: qty 4

## 2012-08-23 MED ORDER — INSULIN ASPART 100 UNIT/ML ~~LOC~~ SOLN
0.0000 [IU] | Freq: Three times a day (TID) | SUBCUTANEOUS | Status: DC
  Administered 2012-08-24: 2 [IU] via SUBCUTANEOUS
  Administered 2012-08-24: 3 [IU] via SUBCUTANEOUS
  Administered 2012-08-24: 2 [IU] via SUBCUTANEOUS

## 2012-08-23 MED ORDER — LABETALOL HCL 5 MG/ML IV SOLN
5.0000 mg | Freq: Once | INTRAVENOUS | Status: DC
  Filled 2012-08-23: qty 4

## 2012-08-23 MED ORDER — SODIUM CHLORIDE 0.9 % IJ SOLN
3.0000 mL | Freq: Two times a day (BID) | INTRAMUSCULAR | Status: DC
  Administered 2012-08-24 – 2012-08-25 (×2): 3 mL via INTRAVENOUS

## 2012-08-23 MED ORDER — LABETALOL HCL 5 MG/ML IV SOLN
5.0000 mg | INTRAVENOUS | Status: DC | PRN
  Administered 2012-08-23: 5 mg via INTRAVENOUS

## 2012-08-23 MED ORDER — SODIUM CHLORIDE 0.9 % IJ SOLN: 3.0000 mL | INTRAMUSCULAR | Status: DC | PRN

## 2012-08-23 MED ORDER — ASPIRIN EC 325 MG PO TBEC
325.0000 mg | DELAYED_RELEASE_TABLET | Freq: Every day | ORAL | Status: DC
  Administered 2012-08-23: 325 mg via ORAL
  Filled 2012-08-23 (×3): qty 1

## 2012-08-23 MED ORDER — NICARDIPINE HCL IN NACL 20-0.86 MG/200ML-% IV SOLN
5.0000 mg/h | INTRAVENOUS | Status: DC
  Administered 2012-08-23: 5 mg/h via INTRAVENOUS
  Filled 2012-08-23: qty 200

## 2012-08-23 MED ORDER — LIDOCAINE HCL (PF) 1 % IJ SOLN
30.0000 mL | Freq: Once | INTRAMUSCULAR | Status: DC
  Filled 2012-08-23 (×2): qty 30

## 2012-08-23 MED ORDER — LABETALOL HCL 5 MG/ML IV SOLN
10.0000 mg | INTRAVENOUS | Status: DC | PRN
  Administered 2012-08-23: 10 mg via INTRAVENOUS

## 2012-08-23 NOTE — H&P (Signed)
Hospital Admission Note Date: 08/23/2012  Patient name: Richard [name] Medical record number: [id number] Date of birth: [birth year] Age: 56 y.o. Gender: male PCP: No primary provider on file.  Medical Service: Internal Medicine Teaching Service--[facility]  Attending physician: Dr. Cathren Laine    1st Contact: Dr. Matthew Folks 2nd Contact: Dr. Burnard Bunting    RL:[phone] After 5 pm or weekends: 1st Contact:      Pager: [phone] 2nd Contact:      Pager: [phone]  Chief Complaint: Right sided paralysis  History of Present Illness: Richard [name] is a 56 year old pleasant African American male with PMH uncontrolled HTN, DMII, prior CVA (04/2012--given tpa), migraines, and GERD presented to the ED today with complaints of complete right sided paralysis after waking up this morning.  He claims when he woke up this morning he went to the bathroom and noticed some right leg weakness and shakiness.  He didn't pay it much mind, went to eat breakfast and fell back asleep.  When he woke up at 9am, his complete right side was paralyzed and he is still unable to move his right arm or leg.  He claims to have intact sensation and feels pressure but just cannot move his right sided extremities.  Richard [name] endorses the same thing happening 3-4 months prior when he also went to the hospital and was given tpa.  However, at that time, his paralysis resolved after an hour too and has been resolved until this morning.  Code stroke was called at 1230pm today and deemed to be outside of the tpa or IR time frame.    He has no other complaints at this time.  He denies any headaches, blurry vision, fever, chills, nausea, vomiting, diarrhea, constipation, abdominal pain, chest pain, shortness of breath, or any urinary complaints at this time.    Meds: Current Outpatient Rx  Name  Route  Sig  Dispense  Refill  . ASPIRIN EC 81 MG PO TBEC   Oral   Take 81 mg by mouth daily.         Marland Kitchen VITAMIN D 2000 UNITS PO CAPS   Oral  Take 1 capsule by mouth daily.         Marland Kitchen GLIPIZIDE ER 5 MG PO TB24   Oral   Take 5 mg by mouth daily as needed. Takes if blood sugar is elevated         . HYDRALAZINE HCL 10 MG PO TABS   Oral   Take 10 mg by mouth 3 (three) times daily.         Marland Kitchen METFORMIN HCL 1000 MG PO TABS   Oral   Take 1,000 mg by mouth 2 (two) times daily as needed. Takes if blood sugar is elevated         . OMEPRAZOLE 20 MG PO CPDR   Oral   Take 20 mg by mouth daily.         Marland Kitchen RANITIDINE HCL 300 MG PO TABS   Oral   Take 300 mg by mouth 2 (two) times daily.         Marland Kitchen SPIRONOLACTONE-HCTZ 25-25 MG PO TABS   Oral   Take 1 tablet by mouth daily.         Marland Kitchen PANTOPRAZOLE SODIUM 40 MG PO TBEC   Oral   Take 40 mg by mouth daily.          Allergies: Allergies as of 08/23/2012  . (No Known Allergies)  Past Medical History  Diagnosis Date  . Hypertension   . Arthritis   . GERD (gastroesophageal reflux disease)   . Migraines   . Neuropathy   . Ruptured lumbar disc   . Diabetes mellitus     diet controlled  . Stroke    Past Surgical History  Procedure Date  . Knee surgery     bil   No family history on file. History   Social History  . Marital Status: Divorced    Spouse Name: N/A    Number of Children: N/A  . Years of Education: N/A   Occupational History  . Not on file.   Social History Main Topics  . Smoking status: Current Every Day Smoker -- 1.0 packs/day    Types: Cigarettes  . Smokeless tobacco: Not on file  . Alcohol Use: No  . Drug Use: Yes    Special: Marijuana     Comment: last use was about a week ago  . Sexually Active:    Other Topics Concern  . Not on file   Social History Narrative  . No narrative on file   Review of Systems: Pertinent items are noted in HPI.  Physical Exam: Blood pressure 207/106, pulse 78, temperature 97.8 F (36.6 C), temperature source Oral, resp. rate 18, SpO2 100.00%. Vitals reviewed. General: resting in bed,  NAD HEENT: PERRLA, EOMI, no scleral icterus Cardiac: RRR, no rubs or gallops Pulm: clear to auscultation bilaterally, no wheezes, rales, or rhonchi Abd: soft, nontender, nondistended, BS present Ext: cool, +2 dp b/l, no pedal edema, reports soreness to deep palpation of RLE. Right upper and lower extremity flaccidity.   Neuro: alert and oriented X3, cranial nerves II-XII grossly intact, however, slight right facial asymmetry notable upon facial nerve testing between movements.  Strength 5/5 left upper and lower extremity.  Sensation to light touch equal in bilateral upper and lower extremities.    Lab results: Basic Metabolic Panel:  Basename 08/23/12 1304  NA 138  K 4.1  CL 101  CO2 25  GLUCOSE 173*  BUN 25*  CREATININE 2.16*  CALCIUM 9.8  MG --  PHOS --   Liver Function Tests:  Basename 08/23/12 1304  AST 13  ALT 9  ALKPHOS 88  BILITOT 0.2*  PROT 7.3  ALBUMIN 3.3*   CBC:  Basename 08/23/12 1304  WBC 6.5  NEUTROABS 4.0  HGB 15.6  HCT 43.1  MCV 86.2  PLT 220   Coagulation:  Basename 08/23/12 1304  LABPROT 12.0  INR 0.89   Urine Drug Screen: Drugs of Abuse     Component Value Date/Time   LABOPIA NONE DETECTED 05/21/2012 2340   COCAINSCRNUR NONE DETECTED 05/21/2012 2340   LABBENZ NONE DETECTED 05/21/2012 2340   AMPHETMU NONE DETECTED 05/21/2012 2340   THCU POSITIVE* 05/21/2012 2340   LABBARB NONE DETECTED 05/21/2012 2340   Urinalysis:  Basename 08/23/12 1346  COLORURINE YELLOW  LABSPEC 1.022  PHURINE 6.0  GLUCOSEU 250*  HGBUR TRACE*  BILIRUBINUR NEGATIVE  KETONESUR NEGATIVE  PROTEINUR >300*  UROBILINOGEN 1.0  NITRITE NEGATIVE  LEUKOCYTESUR NEGATIVE   Imaging results:  Ct Head Wo Contrast  08/23/2012  *RADIOLOGY REPORT*  Clinical Data: Right-sided weakness.  CT HEAD WITHOUT CONTRAST  Technique:  Contiguous axial images were obtained from the base of the skull through the vertex without contrast.  Comparison: 05/22/2012 MRI.  CT of 05/21/2012.   Findings: Bone windows demonstrate mucous retention cyst or polyp in the left maxillary sinus.  Petrous apices are  aerated. Clear mastoid air cells.  Soft tissue windows demonstrate mildly age advanced cerebral atrophy.  Mild low density in the periventricular white matter likely related to small vessel disease.  Arachnoid cyst adjacent the left frontal lobe is chronic and measures 2.2 cm maximally.  No hemorrhage, hydrocephalus, intra-axial, or extra-axial fluid collection.  No convincing evidence of acute infarct.  IMPRESSION:  1. No acute intracranial abnormality. 2.  Mildly advanced cerebral atrophy with small vessel ischemic change.  Findings called to Dr. Leonel Ramsay at 1:06 p.m.   Original Report Authenticated By: Abigail Miyamoto, M.D.    Other results: EKG:89bpm sinus rhythm, bi-atrial abnormalities, t wave inversions in inferior leads (not new), LVH  Assessment & Plan by Problem: Mr. Gable is a 56 year old pleasant African American male with PMH uncontrolled HTN, DMII, prior CVA (04/2012--given tpa), migraines, admitted for right sided paralysis x1 day.     Right sided paralysis--upper and lower extremity flaccidity x1 day duration.  Most likely secondary to CVA vs TIA? Similar episode in 04/2012, received tpa, spontaneous resolution of symptoms until now.  MRI 05/22/12: no acute intracranial abnormality, remote lacunar infarct of LCR, chronic microvascular ischemia.  MRA 05/22/12: moderate small vessel disease b/l, 50% stenosis of proximal cavernous RICA, small vessel disease in MCA branch vessels worse on left. Hypoplastic and potentially stenotic distal RVA.  CT head on admission today: no acute intracranial abnormality, mildly advanced atropy with small vessel ischemic change. -admit to step down -MRI/MRA -neurology following--seen by Dr. Leonel Ramsay -neuro checks -swallow screen -2D echo -carotid dopplers -risk stratification: Lipid panel, HbA1c, TSH -bed rest -PT/OT/SLP -UDS -HIV    HTN (hypertension) Emergency--BP on arrival to ED 206/125.  Received Labetalol 5mg  x2 in ED.  End organ damage to kidneys and likely to brain.  Chronic hypertension with systolic pressures > [id number] at baseline.  He is followed at the [facility] and reports compliance with home meds. Uncontrolled hypertension may be related to renal disease, for which he is followed by a nephrologist at the [facility]). On home regimen: Hydralazine 10mg  TID, spirinolactone-HCTZ 25-25) -per neurology, perfusing well at current blood pressures: recommend Labetolol 10mg  IV Q10 min PRN SBP >185 DBP >110, hold HR <70 -Mr. Boni has enrolled in a stroke study (IMPACT24 trial) conducted at [facility] and is being followed closely by Neurology--Dr. Leonel Ramsay -Hold home meds for now in setting of possible acute CVA and until he passes swallow eval  -Records from nephrologist before initiating further workup for uncontrolled hypertension (renal US on 05/22/12 notes right kidney cyst)   Stage 3 chronic renal impairment associated with type 2 diabetes mellitus--GFR on admission: 38, Cr: 2.16.  Seen by nephrologist at [facility] 2 weeks prior, attempting to obtain records.   -AM BMET -continue to monitor   Tobacco abuse--1ppd since 1990.   -nicotine patch -smoking cessation strongly advised and counseling given   Type 2 diabetes mellitus--Hb A1c 6.0 05/2011.  On home regimen: glipizide 5mg  PRN and Metformin 1000mg  BID.  Glucose 173 on admission -HbA1C -CBG monitoring  -SSI sensitive -Continue to monitor  Diet: NPO pending swallow screen DVT Ppx: SCDs Dispo: Disposition is deferred at this time, awaiting improvement of current medical problems. Anticipated discharge in approximately 2-3 day(s).   The patient does have a current PCP (Dorthula Rue and [place], used to see Dr. Leroy Libman), therefore [facility] after [facility].   The patient does have transportation limitations that hinder transportation to clinic  appointments.  SignedJerene Pitch 08/23/2012,  6:03 PM

## 2012-08-23 NOTE — ED Notes (Signed)
Admitting MD at bedside.

## 2012-08-23 NOTE — Consult Note (Signed)
Reason for Consult:Stroke Referring Physician: Lita Mains, D  CC: Rigth sided weakness  History is obtained from:patient  HPI: Richard [name] is a 56 y.o. male who awoke this morning 7 AM with some mild weakness on his right side. He went to sleep, and when he awoke at 9 AM his right eye was densely plegic. He has had no improvement since that time. Of note, he has similar episode 3 months ago and was given TPA with improvement.  LSN: 9pm tpa given: no out of window.   ROS: A 14 point ROS was performed and is negative except as noted in the HPI.  Past Medical History  Diagnosis Date  . Hypertension   . Arthritis   . GERD (gastroesophageal reflux disease)   . Migraines   . Neuropathy   . Ruptured lumbar disc   . Diabetes mellitus     diet controlled  . Stroke     Family History: Father-to  Social History: Tob: Continues to smoke  Exam: Current vital signs: BP 186/142  Pulse 76  Temp 97.8 F (36.6 C) (Oral)  Resp 18  SpO2 99% Vital signs in last 24 hours: Temp:  [97.8 F (36.6 C)] 97.8 F (36.6 C) (12/02 1221) Pulse Rate:  [76-88] 76  (12/02 1445) Resp:  [15-20] 18  (12/02 1445) BP: (151-215)/(106-142) 186/142 mmHg (12/02 1445) SpO2:  [98 %-100 %] 99 % (12/02 1445)  General: In bed, no apparent distress CV: Regular rate and rhythm Mental Status: Patient is awake, alert, oriented to person, place, month, year, and situation. Immediate and remote memory are intact. Patient is able to give a clear and coherent history. Cranial Nerves: II: Visual Fields are full. Pupils are equal, round, and reactive to light.  Discs are difficult to visualize. III,IV, VI: EOMI without ptosis or diploplia.  V: Facial sensation is symmetric to temperature VII: Facial movement is symmetric.  VIII: hearing is intact to voice X: Uvula elevates symmetrically XI: Shoulder shrug is symmetric. XII: tongue is midline without atrophy or fasciculations.  Motor: Tone is normal. Bulk is  normal. 5/5 strength was present in the left extremities th right was 0/5 in th arm, 1/5 proximally in the leg 0/5 distally Sensory: Sensation is symmetric to light touch and temperature in the arms and legs. Deep Tendon Reflexes: 2+ and symmetric in the biceps and patellae.  Cerebellar: FNF intact on left Gait: Unable to test 2/2 weakness  I have reviewed labs in epic and the results pertinent to this consultation are: Elevated cr  I have reviewed the images obtained:CT head - neg acute  Impression: 56 yo M with a likely subcortical infarct. He is outside tPA or intervention windows. He is a candidate for the IMPACT trial.   Recommendations: 1. HgbA1c, fasting lipid panel 2. MRI, MRA  of the brain without contrast 3. PT consult, OT consult, Speech consult 4. Echocardiogram 5. Carotid dopplers 6. Prophylactic therapy-Antiplatelet med: Aspirin - dose 325 mg 7. Risk factor modification 8. Telemetry monitoring 9. Frequent neuro checks 10. BP goal 185/110 for impact trial, patient is perfusing well at this pressure prior to treatment, but then increased and therefore have started nicardipine.   Roland Rack, MD [facility] [phone]  If 7pm- 7am, please page neurology on call at [phone].

## 2012-08-23 NOTE — ED Notes (Signed)
Code stroke called at 1232 after EDP exam.

## 2012-08-23 NOTE — ED Notes (Signed)
BP 201/124. Repeat BP 211/128. HR 83

## 2012-08-23 NOTE — ED Notes (Signed)
LSN yesterday 2100. Awoke at 0700 to go to bathroom, noted right sided weakness, unsteady gait, "stumbling" & then went back to bed. Awoke at 0900 then noticed paralysis to RUE, RLE. Denies n/v, dizziness, h/a, visual changes, slurred speech.  Sensation to right side unaffected, "I just can't move at all".

## 2012-08-23 NOTE — ED Notes (Signed)
Patient transported to CT 

## 2012-08-23 NOTE — Progress Notes (Signed)
Report called to [facility], RN on 3100. Pt to be transferred via bed on tele by Tilda Burrow, RN and Tim, RN to [phone]. Discussed with patient and patient's wife need for transfer to ICU for closer monitoring and treatment of blood pressure, verbalized understanding.

## 2012-08-23 NOTE — ED Notes (Signed)
Bedside report given to Wes, Therapist, sports. Pt now transported to radiology for study procedure prior to going to room upstairs.

## 2012-08-23 NOTE — ED Notes (Signed)
Dr. Leonie Man at bedside. Aware of continued elevated BP. Order received
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
## 2012-08-23 NOTE — Progress Notes (Signed)
Patient received to room 3314 via stretcher from ED. Pt. Alert and oriented. Patient and SO instructed on safety and oriented to the unit.

## 2012-08-23 NOTE — ED Provider Notes (Addendum)
History     CSN: [id number]  Arrival date & time 08/23/12  1214   First MD Initiated Contact with Patient 08/23/12 1219      Chief Complaint  Patient presents with  . Cerebrovascular Accident    (Consider location/radiation/quality/duration/timing/severity/associated sxs/prior treatment) HPI Pt woke at 7 AM with "shakiness" in R side of his body but was ambulatory. He went back to bed and woke at 0830 with complete right sided paralysis. Sensation intact. Pt states he had CVA with R sided symptoms 3 months ago and received tPA with resolution of symptoms.  Past Medical History  Diagnosis Date  . Hypertension   . Arthritis   . GERD (gastroesophageal reflux disease)   . Migraines   . Neuropathy   . Ruptured lumbar disc   . Diabetes mellitus     diet controlled  . Stroke     Past Surgical History  Procedure Date  . Knee surgery     bil    No family history on file.  History  Substance Use Topics  . Smoking status: Current Every Day Smoker -- 1.0 packs/day    Types: Cigarettes  . Smokeless tobacco: Not on file  . Alcohol Use: No      Review of Systems  Constitutional: Negative for fever and chills.  HENT: Negative for neck pain.   Respiratory: Negative for shortness of breath.   Cardiovascular: Negative for chest pain.  Gastrointestinal: Negative for nausea and abdominal pain.  Musculoskeletal: Negative for back pain.  Skin: Negative for rash and wound.  Neurological: Positive for weakness. Negative for dizziness, seizures, syncope, light-headedness, numbness and headaches.    Allergies  Review of patient's allergies indicates no known allergies.  Home Medications   Current Outpatient Rx  Name  Route  Sig  Dispense  Refill  . ASPIRIN EC 81 MG PO TBEC   Oral   Take 81 mg by mouth daily.         Marland Kitchen VITAMIN D 2000 UNITS PO CAPS   Oral   Take 1 capsule by mouth daily.         Marland Kitchen GLIPIZIDE ER 5 MG PO TB24   Oral   Take 5 mg by mouth daily as  needed. Takes if blood sugar is elevated         . HYDRALAZINE HCL 10 MG PO TABS   Oral   Take 10 mg by mouth 3 (three) times daily.         Marland Kitchen METFORMIN HCL 1000 MG PO TABS   Oral   Take 1,000 mg by mouth 2 (two) times daily as needed. Takes if blood sugar is elevated         . OMEPRAZOLE 20 MG PO CPDR   Oral   Take 20 mg by mouth daily.         Marland Kitchen RANITIDINE HCL 300 MG PO TABS   Oral   Take 300 mg by mouth 2 (two) times daily.         Marland Kitchen SPIRONOLACTONE-HCTZ 25-25 MG PO TABS   Oral   Take 1 tablet by mouth daily.         Marland Kitchen PANTOPRAZOLE SODIUM 40 MG PO TBEC   Oral   Take 40 mg by mouth daily.           BP 186/142  Pulse 76  Temp 97.8 F (36.6 C) (Oral)  Resp 18  SpO2 99%  Physical Exam  Nursing note and vitals reviewed. Constitutional:  He is oriented to person, place, and time. He appears well-developed and well-nourished. No distress.  HENT:  Head: Normocephalic and atraumatic.  Mouth/Throat: Oropharynx is clear and moist.  Eyes: EOM are normal. Pupils are equal, round, and reactive to light.  Neck: Normal range of motion. Neck supple.  Cardiovascular: Normal rate and regular rhythm.   Pulmonary/Chest: Effort normal and breath sounds normal. No respiratory distress. He has no wheezes. He has no rales.  Abdominal: Soft. Bowel sounds are normal. He exhibits no mass. There is no tenderness. There is no rebound and no guarding.  Musculoskeletal: Normal range of motion. He exhibits no edema and no tenderness.  Neurological: He is alert and oriented to person, place, and time.       0/5 motor in RUE/RLE. LUE/LLE 5/5. CN II-XII grossly intact. Finger to nose with L hand intact.   Skin: Skin is warm and dry. No rash noted. No erythema.  Psychiatric: He has a normal mood and affect. His behavior is normal.    ED Course  Procedures (including critical care time)  Labs Reviewed  CBC WITH DIFFERENTIAL - Abnormal; Notable for the following:    MCHC 36.2 (*)       All other components within normal limits  COMPREHENSIVE METABOLIC PANEL - Abnormal; Notable for the following:    Glucose, Bld 173 (*)     BUN 25 (*)     Creatinine, Ser 2.16 (*)     Albumin 3.3 (*)     Total Bilirubin 0.2 (*)     GFR calc non Af Amer 33 (*)     GFR calc Af Amer 38 (*)     All other components within normal limits  URINALYSIS, ROUTINE W REFLEX MICROSCOPIC - Abnormal; Notable for the following:    Glucose, UA 250 (*)     Hgb urine dipstick TRACE (*)     Protein, ur >300 (*)     All other components within normal limits  PROTIME-INR  APTT  URINE MICROSCOPIC-ADD ON   Ct Head Wo Contrast  08/23/2012  *RADIOLOGY REPORT*  Clinical Data: Right-sided weakness.  CT HEAD WITHOUT CONTRAST  Technique:  Contiguous axial images were obtained from the base of the skull through the vertex without contrast.  Comparison: 05/22/2012 MRI.  CT of 05/21/2012.  Findings: Bone windows demonstrate mucous retention cyst or polyp in the left maxillary sinus.  Petrous apices are aerated. Clear mastoid air cells.  Soft tissue windows demonstrate mildly age advanced cerebral atrophy.  Mild low density in the periventricular white matter likely related to small vessel disease.  Arachnoid cyst adjacent the left frontal lobe is chronic and measures 2.2 cm maximally.  No hemorrhage, hydrocephalus, intra-axial, or extra-axial fluid collection.  No convincing evidence of acute infarct.  IMPRESSION:  1. No acute intracranial abnormality. 2.  Mildly advanced cerebral atrophy with small vessel ischemic change.  Findings called to Dr. Leonel Ramsay at 1:06 p.m.   Original Report Authenticated By: Abigail Miyamoto, M.D.      1. CVA (cerebral infarction)   2. HTN (hypertension), malignant       Date: 08/23/2012  Rate: 89  Rhythm: normal sinus rhythm  QRS Axis: normal  Intervals: normal  ST/T Wave abnormalities: nonspecific T wave changes  Conduction Disutrbances:none  Narrative Interpretation:   Old EKG  Reviewed: unchanged  CRITICAL CARE Performed by: Lita Mains, Sheriann Newmann   Total critical care time: 20  Critical care time was exclusive of separately billable procedures and treating other patients.  Critical care was necessary to treat or prevent imminent or life-threatening deterioration.  Critical care was time spent personally by me on the following activities: development of treatment plan with patient and/or surrogate as well as nursing, discussions with consultants, evaluation of patient's response to treatment, examination of patient, obtaining history from patient or surrogate, ordering and performing treatments and interventions, ordering and review of laboratory studies, ordering and review of radiographic studies, pulse oximetry and re-evaluation of patient's condition.   MDM  Given ambiguity of initial symptoms and severity of current weakness, code stroke called for stat neuro eval. Dr Leonel Ramsay felt not to be candidate for thrombolytic or IR therapy. Code Stroke d/c'd  Discussed with internal med resident. Will admit.       Julianne Rice, MD 08/23/12 [place], MD 08/24/12 [phone]

## 2012-08-23 NOTE — Research (Signed)
Patient underwent the ISS insertion procedure. Lidocaine 1% was used for local anesthesia. Dr. Estanislado Pandy anesthetized the upper left hard palate. The Brainsgate puncture tool was used to make a small puncture into the hard palate. Patient tolerated the insertion process well. Bleeding was controlled using light pressure x 2 minutes. Patient was started on the stimulation process at 17:50.

## 2012-08-23 NOTE — ED Notes (Addendum)
Awoke 0700 with c/o weakness & numbness to right side then went back to sleep. Awoke at 0900 noted paralysis to RUE, RLE.  LSN last night at 2100. Had a CVA 3 months ago.

## 2012-08-23 NOTE — Code Documentation (Signed)
56 year old male presents to ED with right side weakness.  States he went to bed last night at 2130 and all was well.  Woke at 0700 with right side weakness - got up to go to BR - unsteady gait - went back to bed and woke at 0900 with right arm and right leg paralysis.  Has recent hx stroke August 2013 with tpa administered. No residual from August event.   LSW 08/22/2012 - last pm - at 2130. On 12/-10/2011:    Arrived to ED at 1214.  EDP exam at 1224.  Code Stroke called at 1232.  Stroke team arrival at 1236.  To CT at 1237.  NIHSS 9.  Right arm and right leg paralyzed - decreased sensation right face.  Outside tpa or IR time frame - research team called by Dr. Leonel Ramsay.  Code stroke cancelled at 1250.

## 2012-08-23 NOTE — ED Notes (Signed)
RN Wes at bedside.

## 2012-08-24 ENCOUNTER — Inpatient Hospital Stay (HOSPITAL_COMMUNITY): Payer: Non-veteran care

## 2012-08-24 DIAGNOSIS — I6350 Cerebral infarction due to unspecified occlusion or stenosis of unspecified cerebral artery: Secondary | ICD-10-CM

## 2012-08-24 DIAGNOSIS — F1210 Cannabis abuse, uncomplicated: Secondary | ICD-10-CM | POA: Diagnosis present

## 2012-08-24 DIAGNOSIS — I6330 Cerebral infarction due to thrombosis of unspecified cerebral artery: Secondary | ICD-10-CM

## 2012-08-24 DIAGNOSIS — I10 Essential (primary) hypertension: Secondary | ICD-10-CM

## 2012-08-24 LAB — BASIC METABOLIC PANEL
Calcium: 9.3 mg/dL (ref 8.4–10.5)
Creatinine, Ser: 2.07 mg/dL — ABNORMAL HIGH (ref 0.50–1.35)
GFR calc non Af Amer: 35 mL/min — ABNORMAL LOW (ref >90)
Glucose, Bld: 197 mg/dL — ABNORMAL HIGH (ref 70–99)
Sodium: 137 mEq/L (ref 135–145)

## 2012-08-24 LAB — GLUCOSE, CAPILLARY
Glucose-Capillary: 171 mg/dL — ABNORMAL HIGH (ref 70–99)
Glucose-Capillary: 217 mg/dL — ABNORMAL HIGH (ref 70–99)
Glucose-Capillary: 195 mg/dL — ABNORMAL HIGH (ref 70–99)

## 2012-08-24 LAB — LIPID PANEL
LDL Cholesterol: 122 mg/dL — ABNORMAL HIGH (ref 0–99)
VLDL: 37 mg/dL (ref 0–40)

## 2012-08-24 LAB — HIV ANTIBODY (ROUTINE TESTING W REFLEX): HIV: NONREACTIVE

## 2012-08-24 LAB — HEMOGLOBIN A1C
Hgb A1c MFr Bld: 7.5 % — ABNORMAL HIGH (ref <5.7)
Mean Plasma Glucose: 169 mg/dL — ABNORMAL HIGH (ref <117)

## 2012-08-24 LAB — TSH: TSH: 0.833 u[IU]/mL (ref 0.350–4.500)

## 2012-08-24 MED ORDER — LABETALOL HCL 5 MG/ML IV SOLN
10.0000 mg | INTRAVENOUS | Status: DC | PRN
  Administered 2012-08-24: 10 mg via INTRAVENOUS
  Filled 2012-08-24: qty 4

## 2012-08-24 MED ORDER — LABETALOL HCL 200 MG PO TABS
200.0000 mg | ORAL_TABLET | Freq: Three times a day (TID) | ORAL | Status: DC
  Administered 2012-08-24 – 2012-08-25 (×3): 200 mg via ORAL
  Filled 2012-08-24 (×5): qty 1

## 2012-08-24 MED ORDER — HYDRALAZINE HCL 10 MG PO TABS
10.0000 mg | ORAL_TABLET | Freq: Three times a day (TID) | ORAL | Status: DC
  Administered 2012-08-24 – 2012-08-25 (×4): 10 mg via ORAL
  Filled 2012-08-24 (×6): qty 1

## 2012-08-24 MED ORDER — DIPHENHYDRAMINE HCL 25 MG PO TABS
25.0000 mg | ORAL_TABLET | Freq: Every evening | ORAL | Status: AC | PRN
  Administered 2012-08-24: 25 mg via ORAL
  Filled 2012-08-24 (×2): qty 1

## 2012-08-24 MED ORDER — CLOPIDOGREL BISULFATE 75 MG PO TABS
75.0000 mg | ORAL_TABLET | Freq: Every day | ORAL | Status: DC
  Administered 2012-08-24 – 2012-08-25 (×2): 75 mg via ORAL
  Filled 2012-08-24 (×3): qty 1

## 2012-08-24 MED ORDER — NICARDIPINE HCL IN NACL 20-0.86 MG/200ML-% IV SOLN
5.0000 mg/h | INTRAVENOUS | Status: DC
  Administered 2012-08-24: 2.5 mg/h via INTRAVENOUS

## 2012-08-24 NOTE — Evaluation (Signed)
Speech Language Pathology Evaluation Patient Details Name: Richard [name] MRN: [id number] DOB: [birth year] Today's Date: 08/24/2012 Time: [id number] SLP Time Calculation (min): 30 min  Problem List:  Patient Active Problem List  Diagnosis  . CVA (cerebral infarction)  . Hypertensive emergency  . Stage 3 chronic renal impairment associated with type 2 diabetes mellitus  . Tobacco abuse  . Protein-calorie malnutrition, mild  . Type 2 diabetes mellitus  . Marijuana abuse   Past Medical History:  Past Medical History  Diagnosis Date  . Hypertension   . Arthritis   . GERD (gastroesophageal reflux disease)   . Migraines   . Neuropathy   . Ruptured lumbar disc   . Diabetes mellitus     diet controlled  . Stroke    Past Surgical History:  Past Surgical History  Procedure Date  . Knee surgery     bil   HPI:  Mr. Richard [name] is a 56 y.o. male presenting with right hemiplegia. Imaging pending. Suspect left brain subcortical  infarct.  Infarct felt to be thromboticc secondary to small vessel disease in setting on medical noncompliance.  Enrolled in [employer]. Stroke work up underway. On aspirin 81 mg orally every day prior to admission. Now on aspirin 325 mg orally every day for secondary stroke prevention. Patient with resultant right hemiplegia.   Assessment / Plan / Recommendation Clinical Impression  Pt presents with normal speech, language, cognitive function per assessment with + ability to read/answer questions and problem-solve through basic functional situations.  Pt appropriately emotional in response to stroke and its potential impact.  No SLP f/u is warranted.  Will sign-off.    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  Overall Cognitive Status: Appears within functional limits for tasks assessed Arousal/Alertness: Awake/alert Orientation Level: Oriented X4    Comprehension  Auditory Comprehension Overall Auditory Comprehension: Appears within functional limits for tasks assessed Visual Recognition/Discrimination Discrimination: Within Function Limits Reading Comprehension Reading Status: Within funtional limits    Expression Expression Primary Mode of Expression: Verbal Verbal Expression Overall Verbal Expression: Appears within functional limits for tasks assessed Written Expression Dominant Hand: Right   Oral / Motor Oral Motor/Sensory Function Overall Oral Motor/Sensory Function: Appears within functional limits for tasks assessed Motor Speech Overall Motor Speech: Appears within functional limits for tasks assessed   GO    Airianna Kreischer L. Tivis Ringer, [place] CCC/SLP Pager [phone]  Juan Quam Laurice 08/24/2012, 12:18 PM

## 2012-08-24 NOTE — Evaluation (Signed)
Physical Therapy Evaluation Patient Details Name: Richard [name] MRN: [id number] DOB: [birth year] Today's Date: 08/24/2012 Time: 1140-1210 PT Time Calculation (min): 30 min  PT Assessment / Plan / Recommendation Clinical Impression  Pt is 56 y/o male admitted for right sided weakness and awaiting MRI results with questionable left CVA.  Pt limited due to overall mobility, balance and strength.  Pt will benefit from acute PT services to continue to address impairments.  Pt will highly benefit from inpatient rehab.     PT Assessment  Patient needs continued PT services    Follow Up Recommendations  CIR    Does the patient have the potential to tolerate intense rehabilitation      Barriers to Discharge None      Equipment Recommendations  None recommended by SLP;None recommended by PT    Recommendations for Other Services Rehab consult   Frequency Min 4X/week    Precautions / Restrictions Precautions Precautions: Fall   Pertinent Vitals/Pain No c/o pain      Mobility  Bed Mobility Bed Mobility: Supine to Sit Supine to Sit: 3: Mod assist;With rails Details for Bed Mobility Assistance: (A) to elevate trunk OOB with cues for technique.   Transfers Transfers: Sit to Stand;Stand to Sit;Stand Pivot Transfers Sit to Stand: 1: +2 Total assist;From bed Sit to Stand: Patient Percentage: 50% Stand to Sit: 1: +2 Total assist;To chair/3-in-1 Stand to Sit: Patient Percentage: 50% Stand Pivot Transfers: 1: +2 Total assist Stand Pivot Transfers: Patient Percentage: 60% Details for Transfer Assistance: +2 (A) to initiate transfer and slowly descend to recliner with cues for technique.  Max cues for step sequence toward recliner.  manual cues to advance right LE.  No buckling noted on Right LE however hyperextension noted. Ambulation/Gait Ambulation/Gait Assistance: Not tested (comment) Modified Rankin (Stroke Patients Only) Pre-Morbid Rankin Score: No symptoms Modified Rankin: Severe  disability    Shoulder Instructions     Exercises     PT Diagnosis: Difficulty walking;Abnormality of gait;Generalized weakness  PT Problem List: Decreased strength;Decreased activity tolerance;Decreased balance;Decreased knowledge of use of DME;Decreased mobility PT Treatment Interventions: DME instruction;Gait training;Functional mobility training;Therapeutic activities;Therapeutic exercise;Balance training;Neuromuscular re-education;Cognitive remediation;Patient/family education   PT Goals Acute Rehab PT Goals PT Goal Formulation: With patient Time For Goal Achievement: 09/07/12 Potential to Achieve Goals: Good Pt will go Supine/Side to Sit: with supervision PT Goal: Supine/Side to Sit - Progress: Goal set today Pt will go Sit to Supine/Side: with supervision PT Goal: Sit to Supine/Side - Progress: Goal set today Pt will go Sit to Stand: with min assist PT Goal: Sit to Stand - Progress: Goal set today Pt will go Stand to Sit: with min assist PT Goal: Stand to Sit - Progress: Goal set today Pt will Transfer Bed to Chair/Chair to Bed: with min assist PT Transfer Goal: Bed to Chair/Chair to Bed - Progress: Goal set today Pt will Stand: with min assist;3 - 5 min PT Goal: Stand - Progress: Goal set today Pt will Ambulate: 16 - 50 feet;with min assist;with least restrictive assistive device PT Goal: Ambulate - Progress: Goal set today  Visit Information  Last PT Received On: 08/24/12 Assistance Needed: +2    Subjective Data  Subjective: "I want to walk again.  I can't believe this has happened." Patient Stated Goal: to go home.  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  ) Comments: pt had CVA August 2013 however no residual affects Communication Communication: No difficulties Dominant Hand: Right    Cognition  Overall Cognitive Status: Appears within functional limits for tasks assessed/performed Arousal/Alertness: Awake/alert Orientation Level: Appears intact for tasks assessed Behavior During Session: [facility] for tasks performed Cognition - Other Comments:  (Pt became emotional at end of session and overwhelmed)    Extremity/Trunk Assessment Right Upper Extremity Assessment RUE ROM/Strength/Tone: Deficits RUE ROM/Strength/Tone Deficits: 2-/5 shoulder flexion, trace grip RUE Sensation: WFL - Light Touch Left Upper Extremity Assessment LUE ROM/Strength/Tone: Within functional levels Right Lower Extremity Assessment RLE ROM/Strength/Tone: Deficits RLE ROM/Strength/Tone Deficits: 2/5 gross RLE Sensation: WFL - Light Touch Left Lower Extremity Assessment LLE ROM/Strength/Tone: Within functional levels LLE Sensation: WFL - Light Touch   Balance Balance Balance Assessed: Yes Static Sitting Balance Static Sitting - Balance Support: Feet supported Static Sitting - Level of Assistance: 4: Min assist;5: Stand by assistance Dynamic Sitting Balance Dynamic Sitting - Balance Support: Feet supported Dynamic Sitting - Level of Assistance: 4: Min assist Dynamic Sitting Balance - Compensations: (A) to maintain balance cue to bilateral sway with scooting laterally  End of Session PT - End of Session Equipment Utilized During Treatment: Gait belt Activity Tolerance: Patient tolerated treatment well Patient left: in chair;with call bell/phone within reach Nurse Communication: Mobility status  GP     Dwayn Moravek 08/24/2012, 12:30 PM Antoine Poche, PT DPT [phone]

## 2012-08-24 NOTE — Progress Notes (Signed)
Rehab Admissions Coordinator Note:  Patient was screened by Theora Master for appropriateness for an Inpatient Acute Rehab Consult.  Note pt already has order for an Inpatient Rehab consult.  Theora Master 08/24/2012, 1:30 PM  I can be reached at [phone].

## 2012-08-24 NOTE — Progress Notes (Signed)
Subjective: Mr. Mcghie was seen and examined at bedside. He was on the phone with his mother when I went to see him.  He reports mild improvement in his ability to move his right upper and lower extremities.  He still cannot make a fist or wiggle his toes.  He is tolerating diet well and wishes to go home soon.  He has no other major complaints at this time.  He denies any N/V/D, chest pain, shortness of breath, headaches, or abdominal pain at this time.   Objective: Vital signs in last 24 hours: Filed Vitals:   08/24/12 1045 08/24/12 1100 08/24/12 1120 08/24/12 1130  BP: 175/96 184/108 188/111 177/103  Pulse: 86 84 88 89  Temp:      TempSrc:      Resp: 18 18 18 16   Height:      Weight:      SpO2: 100% 100% 100% 100%   Weight change:   Intake/Output Summary (Last 24 hours) at 08/24/12 1154 Last data filed at 08/24/12 0941  Gross per 24 hour  Intake 661.33 ml  Output   1025 ml  Net -363.67 ml   Vitals reviewed. General: resting in bed, NAD HEENT: PERRLA, EOMI, no scleral icterus Cardiac: RRR, no rubs, murmurs or gallops Pulm: clear to auscultation bilaterally, no wheezes, rales, or rhonchi Abd: soft, nontender, nondistended, BS present Ext: warm and well perfused, no pedal edema, +2DP B/L, improved mobility of right upper and lower extremity.  Able to lift right hand off bed slightly and move RLE, but cannot make fist or wiggle toes.   Neuro: alert and oriented X3, cranial nerves II-XII grossly intact, however, slight right facial asymmetry notable upon puffing cheeks. Strength 5/5 left upper and lower extremity. Sensation to light touch equal in bilateral upper and lower extremities. 1/5 strength in RUE and RLE.    Lab Results: Basic Metabolic Panel:  Lab [id number] 0625 08/23/12 1304  NA 137 138  K 3.9 4.1  CL 100 101  CO2 29 25  GLUCOSE 197* 173*  BUN 23 25*  CREATININE 2.07* 2.16*  CALCIUM 9.3 9.8  MG -- --  PHOS -- --   Liver Function Tests:  Lab 08/23/12 1304   AST 13  ALT 9  ALKPHOS 88  BILITOT 0.2*  PROT 7.3  ALBUMIN 3.3*   CBC:  Lab 08/23/12 1304  WBC 6.5  NEUTROABS 4.0  HGB 15.6  HCT 43.1  MCV 86.2  PLT 220   Cardiac Enzymes:  Lab 08/24/12 0625 08/24/12 0031 08/23/12 1835  CKTOTAL -- -- --  CKMB -- -- --  CKMBINDEX -- -- --  TROPONINI <0.30 <0.30 <0.30   CBG:  Lab 08/24/12 0827 08/23/12 2201 08/23/12 1858  GLUCAP 171* 176* 109*   Fasting Lipid Panel:  Lab 08/24/12 0625  CHOL 206*  HDL 47  LDLCALC 122*  TRIG 183*  CHOLHDL 4.4  LDLDIRECT --   Coagulation:  Lab 08/23/12 1304  LABPROT 12.0  INR 0.89   Urine Drug Screen: Drugs of Abuse     Component Value Date/Time   LABOPIA NONE DETECTED 08/23/2012 2124   COCAINSCRNUR NONE DETECTED 08/23/2012 2124   LABBENZ NONE DETECTED 08/23/2012 2124   AMPHETMU NONE DETECTED 08/23/2012 2124   THCU POSITIVE* 08/23/2012 2124   LABBARB NONE DETECTED 08/23/2012 2124    Urinalysis:  Lab 08/23/12 1346  COLORURINE YELLOW  LABSPEC 1.022  PHURINE 6.0  GLUCOSEU 250*  HGBUR TRACE*  BILIRUBINUR NEGATIVE  KETONESUR NEGATIVE  PROTEINUR >300*  UROBILINOGEN 1.0  NITRITE NEGATIVE  LEUKOCYTESUR NEGATIVE   Micro Results: Recent Results (from the past 240 hour(s))  MRSA PCR SCREENING     Status: Normal   Collection Time   08/23/12  6:32 PM      Component Value Range Status Comment   MRSA by PCR NEGATIVE  NEGATIVE Final    Studies/Results: Dg Chest 2 View  08/23/2012  *RADIOLOGY REPORT*  Clinical Data: Acute stroke.  Right-sided numbness.  CHEST - 2 VIEW  Comparison:  05/22/2012  Findings:  The heart size and mediastinal contours are within normal limits.  Both lungs are clear.  The visualized skeletal structures are unremarkable.  IMPRESSION: No active cardiopulmonary disease.   Original Report Authenticated By: Earle Gell, M.D.    Ct Head Wo Contrast  08/23/2012  *RADIOLOGY REPORT*  Clinical Data: Right-sided weakness.  CT HEAD WITHOUT CONTRAST  Technique:  Contiguous axial  images were obtained from the base of the skull through the vertex without contrast.  Comparison: 05/22/2012 MRI.  CT of 05/21/2012.  Findings: Bone windows demonstrate mucous retention cyst or polyp in the left maxillary sinus.  Petrous apices are aerated. Clear mastoid air cells.  Soft tissue windows demonstrate mildly age advanced cerebral atrophy.  Mild low density in the periventricular white matter likely related to small vessel disease.  Arachnoid cyst adjacent the left frontal lobe is chronic and measures 2.2 cm maximally.  No hemorrhage, hydrocephalus, intra-axial, or extra-axial fluid collection.  No convincing evidence of acute infarct.  IMPRESSION:  1. No acute intracranial abnormality. 2.  Mildly advanced cerebral atrophy with small vessel ischemic change.  Findings called to Dr. Leonel Ramsay at 1:06 p.m.   Original Report Authenticated By: Abigail Miyamoto, M.D.    Medications: I have reviewed the patient's current medications. Scheduled Meds:   . clopidogrel  75 mg Oral Q breakfast  . hydrALAZINE  10 mg Oral TID  . insulin aspart  0-9 Units Subcutaneous TID WC  . lidocaine  30 mL Intradermal Once  . nicotine  21 mg Transdermal Daily  . sodium chloride  3 mL Intravenous Q12H  . [DISCONTINUED] aspirin EC  325 mg Oral Daily  . [DISCONTINUED] labetalol  5 mg Intravenous Once   Continuous Infusions:   . niCARDipine Stopped (08/24/12 1030)  . [DISCONTINUED] niCARDipine 5 mg/hr (08/23/12 2055)  . [DISCONTINUED] niCARDipine 4 mg/hr (08/24/12 0838)   PRN Meds:.sodium chloride, labetalol, sodium chloride, [DISCONTINUED] labetalol, [DISCONTINUED] labetalol, [DISCONTINUED] labetalol, [DISCONTINUED] labetalol Assessment/Plan: Mr. Calvi is a 56 year old pleasant African American male with PMH uncontrolled HTN, DMII, prior CVA (04/2012--given tpa), migraines, admitted for right sided paralysis.   Right sided paralysis--upper and lower extremity flaccidity x1 day duration. Most likely secondary to  subcortical infarct vs TIA? Similar episode in 04/2012, received tpa, spontaneous resolution of symptoms until now. MRI 05/22/12: no acute intracranial abnormality, remote lacunar infarct of LCR, chronic microvascular ischemia. MRA 05/22/12: moderate small vessel disease b/l, 50% stenosis of proximal cavernous RICA, small vessel disease in MCA branch vessels worse on left. Hypoplastic and potentially stenotic distal RVA.  Echo 05/23/12: mild LVH, moderate concentric hypertrophy.  EF 55-60%. Grade 2 diastolic dysfunction.  No cardiac source of emboli identified.  CT head on admission today: no acute intracranial abnormality, mildly advanced atropy with small vessel ischemic change.  Carotid Doppler 05/22/12: No significant extracranial carotid artery stenosis demonstrated.  Enrolled in [employer] trial.  -transfer to med/surg or inpatient rehab (consult pending) -MRI/MRA  -neurology following-- Dr. Leonel Ramsay and Leonie Man following--suspect  left brain subcortical infarct possibly secondary to small vessel disease in setting of uncontrolled HTN and medication non-adherence.    -neuro checks  -Cancel 2D echo and carotid dopplers as last done in September 2013.   -risk stratification: Lipid panel, HbA1c, TSH  -PT/OT/SLP  -UDS--+ THC -HIV  -Risk stratification: HbA1c, tsh, lipid panel: cholesterol 206, LDL: 122, HDL: 47, TG: 183  HTN (hypertension) Emergency--BP on arrival to ED 206/125. Received Labetalol 5mg  x2 in ED. End organ damage to kidneys and likely to brain. Chronic hypertension with systolic pressures > [id number] at baseline. He is [facility] at the [facility] and reports compliance with home meds. Uncontrolled hypertension may be related to renal disease, for which he is followed by a nephrologist at the [facility]). On home regimen: Hydralazine 10mg  TID, spirinolactone-HCTZ 25-25)  -per neurology, perfusing well at current blood pressures: recommend Labetolol 10mg  IV Q10 min PRN SBP >185 DBP >110, hold HR <70 and was  placed on Nicardipine drip yesterday -Wean off nicardipine drip today, resume hydralazine (10mg  TID) and labetalol (200mg  TID). SBP goal <200 -Enrolled in a stroke study (IMPACT24 trial) conducted at [facility] and is being followed closely by Neurology -Records from nephrologist before initiating further workup for uncontrolled hypertension (renal US on 05/22/12 notes right kidney cyst)   Stage 3 chronic renal impairment associated with type 2 diabetes mellitus--GFR on admission: 38, Cr: 2.16. Seen by nephrologist at [facility] 2 weeks prior, attempting to obtain records.  -Cr improving: 2.07 today, GFR: 40 -continue to monitor   Tobacco abuse--1ppd since 1990.  -nicotine patch  -smoking cessation strongly advised and counseling given   Type 2 diabetes mellitus--Hb A1c 6.0 05/2011. On home regimen: glipizide 5mg  PRN and Metformin 1000mg  BID. Glucose 173 on admission  -HbA1C  -CBG monitoring  -SSI sensitive  -Continue to monitor   Diet: Carb modified DVT Ppx: SCDs  Dispo: Disposition is deferred at this time, awaiting improvement of current medical problems. Anticipated discharge in approximately 2-3 day(s).  The patient does have a current PCP (Dorthula Rue and [place], used to see Dr. Leroy Libman), therefore may not [facility] follow-up after discharge.  The patient does have transportation limitations that hinder transportation to clinic appointments.    LOS: 1 day   Jerene Pitch 08/24/2012, 11:54 AM

## 2012-08-24 NOTE — H&P (Signed)
Internal Medicine Teaching Service Attending Note Date: 08/24/2012  Patient name: Richard [name]  Medical record number: [id number]  Date of birth: Sep 23, 1955    This patient has been seen and discussed with the house staff. Please see their note for complete details. I concur with their findings with the following additions/corrections: Patient is a 56 year old African American man with past medical history of uncontrolled hypertension, diabetes type 2, current smoker, chronic kidney disease stage III, prior CVA, reflux disease and chronic migraines who comes in with chief complaint of right-sided paralysis. The patient woke up on the morning of admission at 7 AM with mild weakness on his right side(residual weakness from previous stroke) but went back to sleep. He woke up again after couple of hours and was unable to move his right side. Patient was brought to the ER as code stroke which was eventually canceled as per the neurology consult in the ER.   Review of system was negative except noted above.  Past medical history, past surgical history, social history and family history as per resident's note.  BP 167/101  Pulse 82  Temp 98.7 F (37.1 C) (Oral)  Resp 15  Ht 6' (1.829 m)  Wt 185 lb 6.5 oz (84.1 kg)  BMI 25.15 kg/m2  SpO2 100% Physical exam Constitutional: He is oriented to person, place, and time and was resting in bed at the time of my evaluation. Very pleasant and cooperative. Head: Normocephalic.  Eyes: Pupils round and reactive to light  Neck: Normal range of motion. Neck supple. No thyromegaly present.  Cardiovascular: Normal rate and regular rhythm. No murmurs rubs or gallops, S1 and S2 normal Pulmonary/Chest: Effort normal and breath sounds normal. No respiratory distress.  Abdominal: Soft. Nontender Bowel sounds are normal.  Neurological: He is alert and oriented to person, place, and time.  Follow simple commands. Dense right hemiparesis. Patient is able to move his  right arm and elbow but has no movement in his hand. Similarly patient is able to move his right leg at thigh and knees but unable to move the foot. DTR's are 2+ to 3+ on the right. Senses pain in the right arm, leg. No facial droop or tongue deviation. Cognitively appropriate.  Skin: Skin is warm and dry.  Psychiatric: He has a normal mood and affect  Labs and imaging studies were reviewed.  EKG: 89 beats per minute, normal axis, borderline left ventricle hypertrophy(does not meet criteria), T wave inversions in lead 2, 3, aVF which were present on previous EKG.  Assessment and plan: Patient is a 56 year old man with past medical history consistent with uncontrolled diabetes, uncontrolled hypertension, current smoking, chronic kidney disease and history of noncompliance with his medications who presented with right hemiplegia. Patient had a similar episode 3 months ago when he received tPA. Patient had a complete workup to look for reversible causes and risk stratification at that time. Currently patient needs to control his chronic diseases appropriately which includes better blood pressure management and diabetes control. Patient also needs to quit smoking at this time which was discussed extensively with the patient. Patient is adamant that he is doing everything possible to control his chronic diseases at this time. Patient will be transferred out of ICU as he does not need IV drip titration to control his blood pressure.  We will focus our efforts to discharge him home on better blood pressure regimen and a list of medications to control his other chronic medical problems. Patient does not want to try  nicotine patches at this time but assures me that he will try electronic cigarettes when he goes out from the hospital.  We will consult inpatient rehabilitation at this time for their assessment and management.     Janell Quiet 08/24/2012, 4:00 PM

## 2012-08-24 NOTE — Progress Notes (Signed)
Stroke Team Progress Note  HISTORY Richard [name] is a 56 y.o. male who awoke this morning 08/23/2012 at 7 AM with some mild weakness on his right side. He went to sleep, and when he awoke at 9 AM his right eye was densely plegic. He has had no improvement since that time. Of note, he has similar episode 3 months ago and was given TPA with improvement. Patient was not a TPA candidate secondary to presenting outside the window. He was enrolled in the IMPACT trial. Due to malignant hypertension with BP 185/110, he was started on cardene. He was admitted to the neuro ICU for further evaluation and treatment.  SUBJECTIVE No family is at the bedside.  Overall he feels his condition is gradually improving. He lives with his fiance. She does not work, so she is home during the day and can help provide care.  OBJECTIVE Most recent Vital Signs: Filed Vitals:   08/24/12 0530 08/24/12 0600 08/24/12 0630 08/24/12 0700  BP: 168/86 186/91 181/93 182/97  Pulse: 80 80 75 77  Temp:      TempSrc:      Resp: 17 14 13 18   Height:      Weight:  84.1 kg (185 lb 6.5 oz)    SpO2: 100% 100% 100% 100%   CBG (last 3)   Basename 08/23/12 2201 08/23/12 1858  GLUCAP 176* 109*    IV Fluid Intake:     . niCARDipine Stopped (08/24/12 0105)  . [DISCONTINUED] niCARDipine 5 mg/hr (08/23/12 2055)    MEDICATIONS    . aspirin EC  325 mg Oral Daily  . insulin aspart  0-9 Units Subcutaneous TID WC  . lidocaine  30 mL Intradermal Once  . nicotine  21 mg Transdermal Daily  . sodium chloride  3 mL Intravenous Q12H  . [DISCONTINUED] labetalol  5 mg Intravenous Once   PRN:  sodium chloride, labetalol, sodium chloride, [DISCONTINUED] labetalol, [DISCONTINUED] labetalol, [DISCONTINUED] labetalol  Diet:  Cardiac thin liquids Activity:  Bedrest DVT Prophylaxis:  SCDs   CLINICALLY SIGNIFICANT STUDIES Basic Metabolic Panel:  Lab [id number] 0625 08/23/12 1304  NA 137 138  K 3.9 4.1  CL 100 101  CO2 29 25  GLUCOSE 197*  173*  BUN 23 25*  CREATININE 2.07* 2.16*  CALCIUM 9.3 9.8  MG -- --  PHOS -- --   Liver Function Tests:  Lab 08/23/12 1304  AST 13  ALT 9  ALKPHOS 88  BILITOT 0.2*  PROT 7.3  ALBUMIN 3.3*   CBC:  Lab 08/23/12 1304  WBC 6.5  NEUTROABS 4.0  HGB 15.6  HCT 43.1  MCV 86.2  PLT 220   Coagulation:  Lab 08/23/12 1304  LABPROT 12.0  INR 0.89   Cardiac Enzymes:  Lab 08/24/12 0625 08/24/12 0031 08/23/12 1835  CKTOTAL -- -- --  CKMB -- -- --  CKMBINDEX -- -- --  TROPONINI <0.30 <0.30 <0.30   Urinalysis:  Lab 08/23/12 1346  COLORURINE YELLOW  LABSPEC 1.022  PHURINE 6.0  GLUCOSEU 250*  HGBUR TRACE*  BILIRUBINUR NEGATIVE  KETONESUR NEGATIVE  PROTEINUR >300*  UROBILINOGEN 1.0  NITRITE NEGATIVE  LEUKOCYTESUR NEGATIVE   Lipid Panel    Component Value Date/Time   CHOL 206* 08/24/2012 0625   TRIG 183* 08/24/2012 0625   HDL 47 08/24/2012 0625   CHOLHDL 4.4 08/24/2012 0625   VLDL 37 08/24/2012 0625   LDLCALC 122* 08/24/2012 0625   HgbA1C  Lab Results  Component Value Date   HGBA1C 6.0* 06/22/2011  Urine Drug Screen:     Component Value Date/Time   LABOPIA NONE DETECTED 08/23/2012 2124   COCAINSCRNUR NONE DETECTED 08/23/2012 2124   LABBENZ NONE DETECTED 08/23/2012 2124   AMPHETMU NONE DETECTED 08/23/2012 2124   THCU POSITIVE* 08/23/2012 2124   LABBARB NONE DETECTED 08/23/2012 2124    Alcohol Level: No results found for this basename: ETH:2 in the last 168 hours   CT of the brain  08/23/2012  1. No acute intracranial abnormality. 2.  Mildly advanced cerebral atrophy with small vessel ischemic change.   MRI of the brain    MRA of the brain    2D Echocardiogram  05/23/2012 EF 55-60% with no source of embolus.   Carotid Doppler  05/22/2012 No evidence of hemodynamically significant internal carotid artery stenosis. Vertebral artery flow is antegrade.   CXR  08/23/2012  No active cardiopulmonary disease.    EKG  normal sinus rhythm.   Therapy Recommendations PT - ; OT  - ; ST -   Physical Exam   Present middle-aged African American male not in distress.Awake alert. Afebrile. Head is nontraumatic. Neck is supple without bruit. Hearing is normal. Cardiac exam no murmur or gallop. Lungs are clear to auscultation. Distal pulses are well felt.  Neurological Exam : awake alert oriented x3 with normal speech and language. No dysarthria. Extraocular moments are full range without nystagmus. Vision acuity and fields appear normal. Fundi were not visualized. Moderate right lower facial weakness. Tongue is midline. Moderate right hemiparesis with grade 3/5 strength. Coordination is slow on the right. Sensation appears preserved. Gait was not tested.  NIH stroke scale score 8. ASSESSMENT Mr. Richard [name] is a 56 y.o. male presenting with right hemiplegia. Imaging pending. Suspect left brain subcortical  infarct.  Infarct felt to be thromboticc secondary to small vessel disease in setting on medical noncompliance.  Enrolled in [employer]. Stroke work up underway. On aspirin 81 mg orally every day prior to admission. Now on aspirin 325 mg orally every day for secondary stroke prevention. Patient with resultant right hemiplegia.  Hyperlipidemia, LDL 122, not on statin PTA, goal LDL < 100 THC positive on admission Malignant hypertension, BP > 185/110, started on cardene for BP control Migraines Neuropathy Diabetes, HgbA1c pending Hx TIA, no MRI findings, full workup at that time, s/p tPA Sept 2013 GERD Cigarette smoker  Hospital day # 1  TREATMENT/PLAN  Change aspirin to clopidogrel 75 mg orally every day for secondary stroke prevention.  OOB. Therapy evals. Rehab consult  Cancel 2D echo and carotid doppler are they were just completed in Sept 2013  Stop smoking. Dr. Leonie Man counseled patient  Wean cardene. Resume home hydralazine. SBP goal < 200  Transfer to the floor once off cardene  Dr. Leonie Man discussed case with DR. Nelda Marseille and Dr. Cathren Laine. Stroke service will  write orders. Dr. Nelda Marseille official attending given ICU status. Anticipate quick weaning from cardene with xfer to floor later today where Dr. Cathren Laine will resume as attending.  Burnetta Sabin, MSN, RN, ANVP-BC, ANP-BC, [facility] Pager: [phone] 08/24/2012 8:04 AM   I have personally examined this patient,reviewed pertinent data and developed plan of care. I agree with above Antony Contras, MD Medical Director [place] Pager: [phone] 08/24/2012 5:43 PM

## 2012-08-24 NOTE — Consult Note (Signed)
Physical Medicine and Rehabilitation Consult Reason for Consult: Suspect CVA Referring Physician: I at   HPI: Richard [name] is a 56 y.o. right-handed male with history of diet controlled diabetes mellitus as well as migraine headaches and CVA August of 2013 with little residual and did receive TPA, chronic renal insufficiency with baseline creatinine 2.10. Admitted 08/23/2012 with right-sided weakness. A CT scan showed no acute intracranial abnormalities and remote lacunar infarct. Neurology services has been consulted with echocardiogram and carotid Dopplers pending. Close monitoring of blood pressure 185/110 had been placed on nicardipine. Urine drug drug screen was positive for marijuana. Neurology services consulted suspect left brain subcortical infarct felt to be thrombotic secondary small vessel disease questionable medical noncompliance. Presently patient is maintained on Plavix therapy. A NicoDerm patch was added for history of tobacco abuse. Physical and occupational therapy evaluations are pending. M.D. is requested physical medicine rehabilitation consult to consider inpatient rehabilitation services   Review of Systems  Neurological: Positive for dizziness and headaches.  All other systems reviewed and are negative.   Past Medical History  Diagnosis Date  . Hypertension   . Arthritis   . GERD (gastroesophageal reflux disease)   . Migraines   . Neuropathy   . Ruptured lumbar disc   . Diabetes mellitus     diet controlled  . Stroke    Past Surgical History  Procedure Date  . Knee surgery     bil   No family history on file. Social History:  reports that he has been smoking Cigarettes.  He has been smoking about 1 pack per day. He does not have any smokeless tobacco history on file. He reports that he uses illicit drugs (Marijuana). He reports that he does not drink alcohol. Allergies: No Known Allergies Medications Prior to Admission  Medication Sig Dispense Refill  .  Cholecalciferol (VITAMIN D) 2000 UNITS CAPS Take 1 capsule by mouth daily.      Marland Kitchen glipiZIDE (GLUCOTROL XL) 5 MG 24 hr tablet Take 5 mg by mouth daily as needed. Takes if blood sugar is elevated      . hydrALAZINE (APRESOLINE) 10 MG tablet Take 10 mg by mouth 3 (three) times daily.      . metFORMIN (GLUCOPHAGE) 1000 MG tablet Take 1,000 mg by mouth 2 (two) times daily as needed. Takes if blood sugar is elevated      . omeprazole (PRILOSEC) 20 MG capsule Take 20 mg by mouth daily.      . ranitidine (ZANTAC) 300 MG tablet Take 300 mg by mouth 2 (two) times daily.      Marland Kitchen spironolactone-hydrochlorothiazide (ALDACTAZIDE) 25-25 MG per tablet Take 1 tablet by mouth daily.      . [DISCONTINUED] aspirin EC 81 MG tablet Take 81 mg by mouth daily.      . pantoprazole (PROTONIX) 40 MG tablet Take 40 mg by mouth daily.        Home:    Functional History:   Functional Status:  Mobility:          ADL:    Cognition: Cognition Orientation Level: Oriented X4    Blood pressure 154/92, pulse 80, temperature 97.6 F (36.4 C), temperature source Oral, resp. rate 16, height 6' (1.829 m), weight 84.1 kg (185 lb 6.5 oz), SpO2 100.00%. Physical Exam  Vitals reviewed. Constitutional: He is oriented to person, place, and time.  HENT:  Head: Normocephalic.  Eyes:       Pupils round and reactive to light  Neck: Normal range  of motion. Neck supple. No thyromegaly present.  Cardiovascular: Normal rate and regular rhythm.   Pulmonary/Chest: Effort normal and breath sounds normal. No respiratory distress.  Abdominal: Soft. Bowel sounds are normal. There is no tenderness.  Neurological: He is alert and oriented to person, place, and time.       Follow simple commands. Dense right HP. Did have volitional movement 1/4 Right HF. DTR's are 2+ to 3+ on the right. Senses pain in the right arm, leg. No facial droop or tongue deviation. Cognitively appropriate.  Skin: Skin is warm and dry.  Psychiatric: He has a  normal mood and affect.    Results for orders placed during the hospital encounter of 08/23/12 (from the past 24 hour(s))  CBC WITH DIFFERENTIAL     Status: Abnormal   Collection Time   08/23/12  1:04 PM      Component Value Range   WBC 6.5  4.0 - 10.5 K/uL   RBC 5.00  4.22 - 5.81 MIL/uL   Hemoglobin 15.6  13.0 - 17.0 g/dL   HCT 43.1  39.0 - 52.0 %   MCV 86.2  78.0 - 100.0 fL   MCH 31.2  26.0 - 34.0 pg   MCHC 36.2 (*) 30.0 - 36.0 g/dL   RDW 12.9  11.5 - 15.5 %   Platelets 220  150 - 400 K/uL   Neutrophils Relative 61  43 - 77 %   Neutro Abs 4.0  1.7 - 7.7 K/uL   Lymphocytes Relative 29  12 - 46 %   Lymphs Abs 1.9  0.7 - 4.0 K/uL   Monocytes Relative 8  3 - 12 %   Monocytes Absolute 0.5  0.1 - 1.0 K/uL   Eosinophils Relative 1  0 - 5 %   Eosinophils Absolute 0.1  0.0 - 0.7 K/uL   Basophils Relative 1  0 - 1 %   Basophils Absolute 0.0  0.0 - 0.1 K/uL  COMPREHENSIVE METABOLIC PANEL     Status: Abnormal   Collection Time   08/23/12  1:04 PM      Component Value Range   Sodium 138  135 - 145 mEq/L   Potassium 4.1  3.5 - 5.1 mEq/L   Chloride 101  96 - 112 mEq/L   CO2 25  19 - 32 mEq/L   Glucose, Bld 173 (*) 70 - 99 mg/dL   BUN 25 (*) 6 - 23 mg/dL   Creatinine, Ser 2.16 (*) 0.50 - 1.35 mg/dL   Calcium 9.8  8.4 - 10.5 mg/dL   Total Protein 7.3  6.0 - 8.3 g/dL   Albumin 3.3 (*) 3.5 - 5.2 g/dL   AST 13  0 - 37 U/L   ALT 9  0 - 53 U/L   Alkaline Phosphatase 88  39 - 117 U/L   Total Bilirubin 0.2 (*) 0.3 - 1.2 mg/dL   GFR calc non Af Amer 33 (*) >90 mL/min   GFR calc Af Amer 38 (*) >90 mL/min  PROTIME-INR     Status: Normal   Collection Time   08/23/12  1:04 PM      Component Value Range   Prothrombin Time 12.0  11.6 - 15.2 seconds   INR 0.89  0.00 - 1.49  APTT     Status: Normal   Collection Time   08/23/12  1:04 PM      Component Value Range   aPTT 27  24 - 37 seconds  URINALYSIS, ROUTINE W REFLEX MICROSCOPIC  Status: Abnormal   Collection Time   08/23/12  1:46 PM       Component Value Range   Color, Urine YELLOW  YELLOW   APPearance CLEAR  CLEAR   Specific Gravity, Urine 1.022  1.005 - 1.030   pH 6.0  5.0 - 8.0   Glucose, UA 250 (*) NEGATIVE mg/dL   Hgb urine dipstick TRACE (*) NEGATIVE   Bilirubin Urine NEGATIVE  NEGATIVE   Ketones, ur NEGATIVE  NEGATIVE mg/dL   Protein, ur >300 (*) NEGATIVE mg/dL   Urobilinogen, UA 1.0  0.0 - 1.0 mg/dL   Nitrite NEGATIVE  NEGATIVE   Leukocytes, UA NEGATIVE  NEGATIVE  URINE MICROSCOPIC-ADD ON     Status: Normal   Collection Time   08/23/12  1:46 PM      Component Value Range   Squamous Epithelial / LPF RARE  RARE   WBC, UA 0-2  <3 WBC/hpf   RBC / HPF 3-6  <3 RBC/hpf   Bacteria, UA RARE  RARE  MRSA PCR SCREENING     Status: Normal   Collection Time   08/23/12  6:32 PM      Component Value Range   MRSA by PCR NEGATIVE  NEGATIVE  TROPONIN I     Status: Normal   Collection Time   08/23/12  6:35 PM      Component Value Range   Troponin I <0.30  <0.30 ng/mL  GLUCOSE, CAPILLARY     Status: Abnormal   Collection Time   08/23/12  6:58 PM      Component Value Range   Glucose-Capillary 109 (*) 70 - 99 mg/dL   Comment 1 Notify RN    URINE RAPID DRUG SCREEN (HOSP PERFORMED)     Status: Abnormal   Collection Time   08/23/12  9:24 PM      Component Value Range   Opiates NONE DETECTED  NONE DETECTED   Cocaine NONE DETECTED  NONE DETECTED   Benzodiazepines NONE DETECTED  NONE DETECTED   Amphetamines NONE DETECTED  NONE DETECTED   Tetrahydrocannabinol POSITIVE (*) NONE DETECTED   Barbiturates NONE DETECTED  NONE DETECTED  GLUCOSE, CAPILLARY     Status: Abnormal   Collection Time   08/23/12 10:01 PM      Component Value Range   Glucose-Capillary 176 (*) 70 - 99 mg/dL   Comment 1 Notify RN     Comment 2 Documented in Chart    TROPONIN I     Status: Normal   Collection Time   08/24/12 12:31 AM      Component Value Range   Troponin I <0.30  <0.30 ng/mL  BASIC METABOLIC PANEL     Status: Abnormal   Collection Time    08/24/12  6:25 AM      Component Value Range   Sodium 137  135 - 145 mEq/L   Potassium 3.9  3.5 - 5.1 mEq/L   Chloride 100  96 - 112 mEq/L   CO2 29  19 - 32 mEq/L   Glucose, Bld 197 (*) 70 - 99 mg/dL   BUN 23  6 - 23 mg/dL   Creatinine, Ser 2.07 (*) 0.50 - 1.35 mg/dL   Calcium 9.3  8.4 - 10.5 mg/dL   GFR calc non Af Amer 35 (*) >90 mL/min   GFR calc Af Amer 40 (*) >90 mL/min  TROPONIN I     Status: Normal   Collection Time   08/24/12  6:25 AM  Component Value Range   Troponin I <0.30  <0.30 ng/mL  LIPID PANEL     Status: Abnormal   Collection Time   08/24/12  6:25 AM      Component Value Range   Cholesterol 206 (*) 0 - 200 mg/dL   Triglycerides 183 (*) <150 mg/dL   HDL 47  >39 mg/dL   Total CHOL/HDL Ratio 4.4     VLDL 37  0 - 40 mg/dL   LDL Cholesterol 122 (*) 0 - 99 mg/dL   Dg Chest 2 View  08/23/2012  *RADIOLOGY REPORT*  Clinical Data: Acute stroke.  Right-sided numbness.  CHEST - 2 VIEW  Comparison:  05/22/2012  Findings:  The heart size and mediastinal contours are within normal limits.  Both lungs are clear.  The visualized skeletal structures are unremarkable.  IMPRESSION: No active cardiopulmonary disease.   Original Report Authenticated By: Earle Gell, M.D.    Ct Head Wo Contrast  08/23/2012  *RADIOLOGY REPORT*  Clinical Data: Right-sided weakness.  CT HEAD WITHOUT CONTRAST  Technique:  Contiguous axial images were obtained from the base of the skull through the vertex without contrast.  Comparison: 05/22/2012 MRI.  CT of 05/21/2012.  Findings: Bone windows demonstrate mucous retention cyst or polyp in the left maxillary sinus.  Petrous apices are aerated. Clear mastoid air cells.  Soft tissue windows demonstrate mildly age advanced cerebral atrophy.  Mild low density in the periventricular white matter likely related to small vessel disease.  Arachnoid cyst adjacent the left frontal lobe is chronic and measures 2.2 cm maximally.  No hemorrhage, hydrocephalus, intra-axial,  or extra-axial fluid collection.  No convincing evidence of acute infarct.  IMPRESSION:  1. No acute intracranial abnormality. 2.  Mildly advanced cerebral atrophy with small vessel ischemic change.  Findings called to Dr. Leonel Ramsay at 1:06 p.m.   Original Report Authenticated By: Abigail Miyamoto, M.D.     Assessment/Plan: Diagnosis: left subcortical infarct 1. Does the need for close, 24 hr/day medical supervision in concert with the patient's rehab needs make it unreasonable for this patient to be served in a less intensive setting? Yes 2. Co-Morbidities requiring supervision/potential complications: htn, ckd 3. Due to bladder management, bowel management, safety, skin/wound care, disease management, medication administration, pain management and patient education, does the patient require 24 hr/day rehab nursing? Yes 4. Does the patient require coordinated care of a physician, rehab nurse, PT (1-2 hrs/day, 5 days/week) and OT (1-2 hrs/day, 5 days/week) to address physical and functional deficits in the context of the above medical diagnosis(es)? Yes Addressing deficits in the following areas: balance, endurance, locomotion, strength, transferring, bowel/bladder control, bathing, dressing, feeding, grooming, toileting and psychosocial support 5. Can the patient actively participate in an intensive therapy program of at least 3 hrs of therapy per day at least 5 days per week? Yes 6. The potential for patient to make measurable gains while on inpatient rehab is good 7. Anticipated functional outcomes upon discharge from inpatient rehab are supervision to minimal assist with PT, supervision to minimal assist with OT, n/a with SLP. 8. Estimated rehab length of stay to reach the above functional goals is: 2-3 weeks 9. Does the patient have adequate social supports to accommodate these discharge functional goals? Potentially 10. Anticipated D/C setting: Home 11. Anticipated post D/C treatments: [facility]  therapy 12. Overall Rehab/Functional Prognosis: excellent  RECOMMENDATIONS: This patient's condition is appropriate for continued rehabilitative care in the following setting: CIR Patient has agreed to participate in recommended program. Yes Note that  insurance prior authorization may be required for reimbursement for recommended care.  Comment: follow up on social supports. Rehab RN to follow up.   Oval Linsey, MD     08/24/2012

## 2012-08-25 ENCOUNTER — Encounter (HOSPITAL_COMMUNITY): Payer: Self-pay | Admitting: *Deleted

## 2012-08-25 ENCOUNTER — Inpatient Hospital Stay (HOSPITAL_COMMUNITY)
Admission: RE | Admit: 2012-08-25 | Discharge: 2012-09-03 | DRG: 945 | Disposition: A | Discharge status: Home / Self Care | Payer: Non-veteran care | Source: Ambulatory Visit | Attending: Physical Medicine & Rehabilitation | Admitting: Physical Medicine & Rehabilitation

## 2012-08-25 DIAGNOSIS — I6330 Cerebral infarction due to thrombosis of unspecified cerebral artery: Secondary | ICD-10-CM

## 2012-08-25 DIAGNOSIS — F17200 Nicotine dependence, unspecified, uncomplicated: Secondary | ICD-10-CM | POA: Diagnosis present

## 2012-08-25 DIAGNOSIS — E785 Hyperlipidemia, unspecified: Secondary | ICD-10-CM | POA: Diagnosis present

## 2012-08-25 DIAGNOSIS — Z5189 Encounter for other specified aftercare: Principal | ICD-10-CM

## 2012-08-25 DIAGNOSIS — IMO0001 Reserved for inherently not codable concepts without codable children: Secondary | ICD-10-CM | POA: Diagnosis present

## 2012-08-25 DIAGNOSIS — I129 Hypertensive chronic kidney disease with stage 1 through stage 4 chronic kidney disease, or unspecified chronic kidney disease: Secondary | ICD-10-CM | POA: Diagnosis present

## 2012-08-25 DIAGNOSIS — I639 Cerebral infarction, unspecified: Secondary | ICD-10-CM

## 2012-08-25 DIAGNOSIS — F1210 Cannabis abuse, uncomplicated: Secondary | ICD-10-CM | POA: Diagnosis present

## 2012-08-25 DIAGNOSIS — N189 Chronic kidney disease, unspecified: Secondary | ICD-10-CM | POA: Diagnosis present

## 2012-08-25 LAB — GLUCOSE, CAPILLARY
Glucose-Capillary: 266 mg/dL — ABNORMAL HIGH (ref 70–99)
Glucose-Capillary: 148 mg/dL — ABNORMAL HIGH (ref 70–99)
Glucose-Capillary: 235 mg/dL — ABNORMAL HIGH (ref 70–99)

## 2012-08-25 MED ORDER — SIMVASTATIN 40 MG PO TABS
40.0000 mg | ORAL_TABLET | Freq: Every day | ORAL | Status: DC
  Filled 2012-08-25: qty 1

## 2012-08-25 MED ORDER — NICOTINE 21 MG/24HR TD PT24
21.0000 mg | MEDICATED_PATCH | Freq: Every day | TRANSDERMAL | Status: DC
  Filled 2012-08-25 (×10): qty 1

## 2012-08-25 MED ORDER — CLOPIDOGREL BISULFATE 75 MG PO TABS
75.0000 mg | ORAL_TABLET | Freq: Every day | ORAL | Status: DC
  Administered 2012-08-26 – 2012-09-03 (×9): 75 mg via ORAL
  Filled 2012-08-25 (×11): qty 1

## 2012-08-25 MED ORDER — SORBITOL 70 % SOLN: 30.0000 mL | Freq: Every day | Status: DC | PRN

## 2012-08-25 MED ORDER — TRAZODONE HCL 50 MG PO TABS
50.0000 mg | ORAL_TABLET | Freq: Every evening | ORAL | Status: DC | PRN
  Administered 2012-08-25 – 2012-08-30 (×4): 50 mg via ORAL
  Filled 2012-08-25 (×4): qty 1

## 2012-08-25 MED ORDER — INSULIN ASPART 100 UNIT/ML ~~LOC~~ SOLN
0.0000 [IU] | Freq: Three times a day (TID) | SUBCUTANEOUS | Status: DC
  Administered 2012-08-25: 5 [IU] via SUBCUTANEOUS
  Administered 2012-08-25: 1 [IU] via SUBCUTANEOUS

## 2012-08-25 MED ORDER — ONDANSETRON HCL 4 MG PO TABS: 4.0000 mg | ORAL_TABLET | Freq: Four times a day (QID) | ORAL | Status: DC | PRN

## 2012-08-25 MED ORDER — POLYETHYLENE GLYCOL 3350 17 G PO PACK
17.0000 g | PACK | Freq: Every day | ORAL | Status: DC | PRN
  Filled 2012-08-25: qty 1

## 2012-08-25 MED ORDER — PRAVASTATIN SODIUM 40 MG PO TABS: 40.0000 mg | ORAL_TABLET | Freq: Every day | ORAL | Status: DC

## 2012-08-25 MED ORDER — CLOPIDOGREL BISULFATE 75 MG PO TABS: 75.0000 mg | ORAL_TABLET | Freq: Every day | ORAL | Status: DC

## 2012-08-25 MED ORDER — TRAZODONE HCL 50 MG PO TABS
50.0000 mg | ORAL_TABLET | Freq: Every evening | ORAL | Status: DC | PRN
  Administered 2012-08-25: 50 mg via ORAL
  Filled 2012-08-25: qty 1

## 2012-08-25 MED ORDER — SIMVASTATIN 40 MG PO TABS
40.0000 mg | ORAL_TABLET | Freq: Every day | ORAL | Status: DC
  Administered 2012-08-25 – 2012-08-27 (×3): 40 mg via ORAL
  Filled 2012-08-25 (×4): qty 1

## 2012-08-25 MED ORDER — LABETALOL HCL 200 MG PO TABS
200.0000 mg | ORAL_TABLET | Freq: Three times a day (TID) | ORAL | Status: DC
  Administered 2012-08-25 – 2012-09-03 (×27): 200 mg via ORAL
  Filled 2012-08-25 (×31): qty 1

## 2012-08-25 MED ORDER — ACETAMINOPHEN 325 MG PO TABS
325.0000 mg | ORAL_TABLET | ORAL | Status: DC | PRN
  Filled 2012-08-25: qty 2

## 2012-08-25 MED ORDER — ONDANSETRON HCL 4 MG/2ML IJ SOLN: 4.0000 mg | Freq: Four times a day (QID) | INTRAMUSCULAR | Status: DC | PRN

## 2012-08-25 MED ORDER — HYDRALAZINE HCL 10 MG PO TABS
10.0000 mg | ORAL_TABLET | Freq: Three times a day (TID) | ORAL | Status: DC
  Administered 2012-08-25 – 2012-08-27 (×6): 10 mg via ORAL
  Filled 2012-08-25 (×10): qty 1

## 2012-08-25 MED ORDER — INSULIN ASPART 100 UNIT/ML ~~LOC~~ SOLN
0.0000 [IU] | Freq: Three times a day (TID) | SUBCUTANEOUS | Status: DC
  Administered 2012-08-25: 3 [IU] via SUBCUTANEOUS
  Administered 2012-08-26 (×2): 1 [IU] via SUBCUTANEOUS
  Administered 2012-08-26: 5 [IU] via SUBCUTANEOUS
  Administered 2012-08-27: 2 [IU] via SUBCUTANEOUS
  Administered 2012-08-27: 1 [IU] via SUBCUTANEOUS
  Administered 2012-08-27: 3 [IU] via SUBCUTANEOUS
  Administered 2012-08-28: 2 [IU] via SUBCUTANEOUS
  Administered 2012-08-28: 5 [IU] via SUBCUTANEOUS
  Administered 2012-08-29: 2 [IU] via SUBCUTANEOUS
  Administered 2012-08-29 (×2): 1 [IU] via SUBCUTANEOUS
  Administered 2012-08-30 – 2012-08-31 (×3): 2 [IU] via SUBCUTANEOUS
  Administered 2012-08-31 – 2012-09-01 (×2): 1 [IU] via SUBCUTANEOUS
  Administered 2012-09-01: 2 [IU] via SUBCUTANEOUS
  Administered 2012-09-01 – 2012-09-02 (×2): 1 [IU] via SUBCUTANEOUS
  Administered 2012-09-02: 3 [IU] via SUBCUTANEOUS
  Administered 2012-09-02 – 2012-09-03 (×2): 1 [IU] via SUBCUTANEOUS

## 2012-08-25 MED ORDER — LABETALOL HCL 300 MG PO TABS: 300.0000 mg | ORAL_TABLET | Freq: Three times a day (TID) | ORAL | Status: DC

## 2012-08-25 NOTE — Progress Notes (Signed)
Internal Medicine Teaching Service Attending Note Date: 08/25/2012  Patient name: Richard [name]  Medical record number: [id number]  Date of birth: 1956-06-29    This patient has been seen and discussed with the house staff. Please see their note for complete details. I concur with their findings with the following additions/corrections: Patient will be transferred to inpatient rehabilitation at: Hospital today. BP 170/86  Pulse 70  Temp 97.9 F (36.6 C) (Oral)  Resp 12  Ht 6' (1.829 m)  Wt 185 lb 6.5 oz (84.1 kg)  BMI 25.15 kg/m2  SpO2 100% His blood pressure is still elevated and would need aggressive management to minimize his chances of future stroke. Patient needs a close followup with his primary care physician and neurology once he is discharged from inpatient rehabilitation. Importance of smoking cessation was stressed during my conversation.  Janell Quiet 08/25/2012, 10:53 AM

## 2012-08-25 NOTE — Discharge Summary (Signed)
Internal [facility] Discharge Note  Name: Richard [name] MRN: [id number] DOB: Dec 28, 1955 56 y.o.  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  Hypertensive emergency  Stage 3 chronic renal impairment associated with type 2 diabetes mellitus  Tobacco abuse  Protein-calorie malnutrition, mild  Type 2 diabetes mellitus  Marijuana abuse  Hyperlipidemia  Discharge Medications:   Medication List     As of 08/25/2012  3:28 PM    STOP taking these medications         aspirin EC 81 MG tablet      metFORMIN 1000 MG tablet   Commonly known as: GLUCOPHAGE      pantoprazole 40 MG tablet   Commonly known as: PROTONIX      ranitidine 300 MG tablet   Commonly known as: ZANTAC      spironolactone-hydrochlorothiazide 25-25 MG per tablet   Commonly known as: ALDACTAZIDE      TAKE these medications         clopidogrel 75 MG tablet   Commonly known as: PLAVIX   Take 1 tablet (75 mg total) by mouth daily with breakfast.      glipiZIDE 5 MG 24 hr tablet   Commonly known as: GLUCOTROL XL   Take 5 mg by mouth daily as needed. Takes if blood sugar is elevated      hydrALAZINE 10 MG tablet   Commonly known as: APRESOLINE   Take 10 mg by mouth 3 (three) times daily.      labetalol 300 MG tablet   Commonly known as: NORMODYNE   Take 1 tablet (300 mg total) by mouth 3 (three) times daily.      omeprazole 20 MG capsule   Commonly known as: PRILOSEC   Take 20 mg by mouth daily.      pravastatin 40 MG tablet   Commonly known as: PRAVACHOL   Take 1 tablet (40 mg total) by mouth at bedtime.      Vitamin D 2000 UNITS Caps   Take 1 capsule by mouth daily.         Disposition and follow-up:   [name] was discharged to in patient rehab from [facility] in stable condition.  At the  hospital follow up visit please address: -HTN: started on Labetalol 300mg  PO TID and continuing Hydralazine 10mg  PO TID.  Will need to be closely monitored and adjusted appropriately for risk factor modification for secondary prevention of stroke. -DM: HbA1c: 7.5.  Discontinued home metformin given poor renal function.  Will start Glipizide 5mg  QD -Hyperlipidemia: Started on Pravastatin 40mg  PO QD and consider titrating up if needed -acute CVA--is being discharged to inpatient rehab.  Fill need to follow up with Neurology (2 months Dr. Leonie Man) and [facility] as [facility]--within 6 weeks. -Smoking Cessation please   Follow-up Appointments: Follow-up Information    Follow up with Forbes Cellar, MD. Schedule an appointment as soon as possible for a visit in 2 months. (stroke clinic)    Contact information:   [place], [place] [address] [phone]       Follow up with Primary care physician at the [facility]. Schedule an appointment as soon as possible for a visit in 6 weeks.        Discharge Orders    Future Orders Please Complete By Expires   Diet -  low sodium heart healthy      Diet Carb Modified      Increase activity slowly      Comments:   Per rehab instructions   Discharge instructions      Comments:   Please start taking Labetalol 300mg  three times daily as well as hydralazine 10mg  three times daily. Restart taking Glipizide 5mg  daily, this may need to be increased to control your diabetes. Start Pravastatain 40mg  before bed to control your cholesterol, this may also need to be increased. Start taking Clopidogrel 75 mg daily. Please stop smoking.  Consider [place] for assistance as well   Call MD for:  temperature >100.4      Call MD for:  persistant nausea and vomiting      Call MD for:  severe uncontrolled pain      Call MD for:  difficulty breathing, headache or visual disturbances      Call MD for:  persistant dizziness or  light-headedness        Consultations: Neurology Treatment Team:  Md Stroke, MD  Procedures Performed:  Dg Chest 2 View  08/23/2012  *RADIOLOGY REPORT*  Clinical Data: Acute stroke.  Right-sided numbness.  CHEST - 2 VIEW  Comparison:  05/22/2012  Findings:  The heart size and mediastinal contours are within normal limits.  Both lungs are clear.  The visualized skeletal structures are unremarkable.  IMPRESSION: No active cardiopulmonary disease.   Original Report Authenticated By: Earle Gell, M.D.    Ct Head Wo Contrast  08/23/2012  *RADIOLOGY REPORT*  Clinical Data: Right-sided weakness.  CT HEAD WITHOUT CONTRAST  Technique:  Contiguous axial images were obtained from the base of the skull through the vertex without contrast.  Comparison: 05/22/2012 MRI.  CT of 05/21/2012.  Findings: Bone windows demonstrate mucous retention cyst or polyp in the left maxillary sinus.  Petrous apices are aerated. Clear mastoid air cells.  Soft tissue windows demonstrate mildly age advanced cerebral atrophy.  Mild low density in the periventricular white matter likely related to small vessel disease.  Arachnoid cyst adjacent the left frontal lobe is chronic and measures 2.2 cm maximally.  No hemorrhage, hydrocephalus, intra-axial, or extra-axial fluid collection.  No convincing evidence of acute infarct.  IMPRESSION:  1. No acute intracranial abnormality. 2.  Mildly advanced cerebral atrophy with small vessel ischemic change.  Findings called to Dr. Leonel Ramsay at 1:06 p.m.   Original Report Authenticated By: Abigail Miyamoto, M.D.    [name]  08/24/2012  *RADIOLOGY REPORT*  Clinical Data:  Right-sided weakness.  Prior TPA 04/2012. Hypertension.  MRI BRAIN WITHOUT CONTRAST MRA HEAD WITHOUT CONTRAST  Technique: Multiplanar, multiecho pulse sequences of the brain and surrounding structures were obtained according to standard protocol without intravenous contrast.  Angiographic images of the head were obtained using  MRA technique without contrast.  Comparison: 08/23/2012 CT.   05/22/2012 MR.  MRI HEAD  Findings:  Acute small to moderate size non hemorrhagic infarct posterior left corona radiata extends to the posterior aspect of the left external capsule.  Tiny area of blood breakdown products left cerebellum unchanged probably related to remote hemorrhagic ischemia otherwise no evidence of intracranial hemorrhage.  Remote small infarct mid left corona radiata and anterior right corona radiata.  Small vessel disease type changes.  Anterior left frontal arachnoid cyst unchanged with scalloping of the adjacent calvarium.  Polypoid opacification inferior aspect of the left maxillary sinus. Minimal mucosal thickening ethmoid sinus air cells.  Mild exophthalmos.  No  hydrocephalus.  IMPRESSION: Acute small to moderate size non hemorrhagic infarct posterior left corona radiata extends to the posterior aspect of the left external capsule.  Please see above.  This has been made a PRA call report utilizing dashboard call feature.  MRA HEAD  Findings: 50% narrowing proximal cavernous segment right internal carotid artery once again noted.  Middle cerebral artery branch vessel irregularity more notable on the left where there are decreased number of visualized branches.  Moderate to marked narrowing of the right vertebral artery.  Left vertebral artery is dominant without high-grade stenosis.  PICAs not entirely imaged.  Appearance of moderate narrowing of the proximal basilar artery. Artifact may partially contribute to this appearance.  Nonvisualization AICAs.  Moderate narrowing right superior cerebellar artery.  Moderate focal narrowing proximal right posterior cerebral artery.  Poor delineation posterior cerebral artery distal branches bilaterally.  No aneurysm detected.  IMPRESSION: Intracranial atherosclerotic type changes as detailed above.   Original Report Authenticated By: Genia Del, M.D.    Mr Mra Head/brain Wo  Cm  08/24/2012  *RADIOLOGY REPORT*  Clinical Data:  Right-sided weakness.  Prior TPA 04/2012. Hypertension.  MRI BRAIN WITHOUT CONTRAST MRA HEAD WITHOUT CONTRAST  Technique: Multiplanar, multiecho pulse sequences of the brain and surrounding structures were obtained according to standard protocol without intravenous contrast.  Angiographic images of the head were obtained using MRA technique without contrast.  Comparison: 08/23/2012 CT.   05/22/2012 MR.  MRI HEAD  Findings:  Acute small to moderate size non hemorrhagic infarct posterior left corona radiata extends to the posterior aspect of the left external capsule.  Tiny area of blood breakdown products left cerebellum unchanged probably related to remote hemorrhagic ischemia otherwise no evidence of intracranial hemorrhage.  Remote small infarct mid left corona radiata and anterior right corona radiata.  Small vessel disease type changes.  Anterior left frontal arachnoid cyst unchanged with scalloping of the adjacent calvarium.  Polypoid opacification inferior aspect of the left maxillary sinus. Minimal mucosal thickening ethmoid sinus air cells.  Mild exophthalmos.  No hydrocephalus.  IMPRESSION: Acute small to moderate size non hemorrhagic infarct posterior left corona radiata extends to the posterior aspect of the left external capsule.  Please see above.  This has been made a PRA call report utilizing dashboard call feature.  MRA HEAD  Findings: 50% narrowing proximal cavernous segment right internal carotid artery once again noted.  Middle cerebral artery branch vessel irregularity more notable on the left where there are decreased number of visualized branches.  Moderate to marked narrowing of the right vertebral artery.  Left vertebral artery is dominant without high-grade stenosis.  PICAs not entirely imaged.  Appearance of moderate narrowing of the proximal basilar artery. Artifact may partially contribute to this appearance.  Nonvisualization AICAs.   Moderate narrowing right superior cerebellar artery.  Moderate focal narrowing proximal right posterior cerebral artery.  Poor delineation posterior cerebral artery distal branches bilaterally.  No aneurysm detected.  IMPRESSION: Intracranial atherosclerotic type changes as detailed above.   Original Report Authenticated By: Genia Del, M.D.    2D Echo 05/23/2012: *[facility]* [place], [place] [address] [phone]  ------------------------------------------------------------ Transthoracic Echocardiography  Patient: Shirley, Dudoit MR #: [id number] Study Date: 05/23/2012 Gender: M Age: 25 Height: 182.9cm Weight: 77.1kg BSA: 1.92m^2 Pt. Status: Room: [place], [place] ATTENDING [place], [place] Bevely Palmer SONOGRAPHER Mauricio Po, RDCS, CCT cc:  ------------------------------------------------------------ LV EF: 55% - 60%  ------------------------------------------------------------ Indications:  CVA 436.  ------------------------------------------------------------ History: Risk factors: Current tobacco use. Hypertension.  ------------------------------------------------------------ Study Conclusions  - Left ventricle: Wall thickness was increased in a pattern of mild LVH. There was moderate concentric hypertrophy. Systolic function was normal. The estimated ejection fraction was in the range of 55% to 60%. Wall motion was normal; there were no regional wall motion abnormalities. Features are consistent with a pseudonormal left ventricular filling pattern, with concomitant abnormal relaxation and increased filling pressure (grade 2 diastolic dysfunction). Doppler parameters are consistent with elevated ventricular end-diastolic filling pressure. - Left atrium: The atrium was mildly dilated. - Right atrium: Central venous pressure: 68mm Hg (est). - Atrial  septum: No defect or patent foramen ovale was identified. 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 No thrombus was identified within the aneurysm. Impressions:  - No cardiac source of emboli was indentified. Recommendations: Consider transesophageal echocardiography if clinically indicated. Transthoracic echocardiography. M-mode, complete 2D, spectral Doppler, and color Doppler. Height: Height: 182.9cm. Height: 72in. Weight: Weight: 77.1kg. Weight: 169.6lb. Body mass index: BMI: 23.1kg/m^2. Body surface area: BSA: 1.25m^2. Blood pressure: 184/90. Patient status: Inpatient. Location: Bedside.  ------------------------------------------------------------  ------------------------------------------------------------ Left ventricle: Wall thickness was increased in a pattern of mild LVH. There was moderate concentric hypertrophy. Systolic function was normal. The estimated ejection fraction was in the range of 55% to 60%. Wall motion was normal; there were no regional wall motion abnormalities. The tissue Doppler parameters were abnormal. Features are consistent with a pseudonormal left ventricular filling pattern, with concomitant abnormal relaxation and increased filling pressure (grade 2 diastolic dysfunction). Doppler parameters are consistent with elevated ventricular end-diastolic filling pressure.  ------------------------------------------------------------ Aortic valve: Mildly thickened, mildly calcified leaflets. Doppler: No significant regurgitation.  ------------------------------------------------------------ Mitral valve: Mildly thickened leaflets . Doppler: No significant regurgitation. Peak gradient: 77mm Hg (D).  ------------------------------------------------------------ Left atrium: The atrium was mildly dilated.  ------------------------------------------------------------ Atrial septum: No defect or patent foramen ovale was identified. 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 No thrombus was identified within the aneurysm.  ------------------------------------------------------------ Right ventricle: The cavity size was normal. Systolic function was normal.  ------------------------------------------------------------ Pulmonic valve: The valve appears to be grossly normal. Doppler: No significant regurgitation.  ------------------------------------------------------------ Tricuspid valve: Mildly thickened leaflets. Doppler: No significant regurgitation.  ------------------------------------------------------------ Right atrium: The atrium was normal in size.  ------------------------------------------------------------ Pericardium: There was no pericardial effusion.  ------------------------------------------------------------ Systemic veins: Inferior vena cava: The vessel was normal in size; the respirophasic diameter changes were in the normal range (= 50%); findings are consistent with normal central venous pressure.  ------------------------------------------------------------  2D measurements Normal Doppler Normal Left ventricle measurements LVID ED, 45.4 mm 43-52 Left ventricle chord, Ea, lat 4.17 cm/ ------- PLAX ann, tiss s LVID ES, 32.4 mm 23-38 DP chord, E/Ea, lat 19.52 ------- PLAX ann, tiss FS, chord, 29 % >29 DP PLAX Ea, med 5.26 cm/ ------- LVPW, ED 20.7 mm ------ ann, tiss s IVS/LVPW 0.68 <1.3 DP ratio, ED E/Ea, med 15.48 ------- Vol ED, 113 ml ------ ann, tiss MOD1 DP Vol ES, 59 ml ------ Mitral valve MOD1 Peak E vel 81.4 cm/ ------- EF, MOD1 48 % ------ s Vol index, 57 ml/m^2 ------ Peak A vel 49.4 cm/ ------- ED, MOD1 s Vol index, 30 ml/m^2 ------ Deceleratio 222 ms 150-230 ES, MOD1 n time Ventricular septum Peak 3 mm ------- IVS, ED 14 mm ------ gradient, D Hg Aorta Peak E/A 1.6 ------- Root diam, 30 mm ------ ratio ED Systemic veins Left atrium Estimated 5 mm ------- AP dim 39 mm ------  CVP Hg AP dim 1.96 cm/m^2 <2.2 Right ventricle index Sa vel, lat 14.3 cm/ ------- Vol, S 75 ml ------ ann, tiss s Vol index, 37.7 ml/m^2 ------ DP S Right ventricle RVID ED, 30.9 mm 19-38 PLAX  ------------------------------------------------------------ Prepared and Electronically Authenticated by  Terald Sleeper 2013-09-01T13:18:02.507  Admission HPI: Mr. Stolle is a 56 year old pleasant African American male with PMH uncontrolled HTN, DMII, prior CVA (04/2012--given tpa), migraines, and GERD presented to the ED today with complaints of complete right sided paralysis after waking up this morning. He claims when he woke up this morning he went to the bathroom and noticed some right leg weakness and shakiness. He didn't pay it much mind, went to eat breakfast and fell back asleep. When he woke up at 9am, his complete right side was paralyzed and he is still unable to move his right arm or leg. He claims to have intact sensation and feels pressure but just cannot move his right sided extremities. Mr. Slominski endorses the same thing happening 3-4 months prior when he also went to the hospital and was given tpa. However, at that time, his paralysis resolved after an hour too and has been resolved until this morning. Code stroke was called at 1230pm today and deemed to be outside of the tpa or IR time frame. He has no other complaints at this time. He denies any headaches, blurry vision, fever, chills, nausea, vomiting, diarrhea, constipation, abdominal pain, chest pain, shortness of breath, or any urinary complaints at this time.   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  Similar episode approximately 3-4 months prior that spontaneously resolved.  CT head on admission: no acute intracranial abnormality, mildly advanced atropy with small vessel  ischemic change.  MRI/MRA: Acute small to moderate size non hemorrhagic infarct posterior left corona radiata extends to the posterior aspect of the left external capsule. 50% narrowing proximal cavernous segment right internal carotid artery once again noted.  Middle cerebral artery branch vessel irregularity more notable on the left where there are decreased number of visualized branches.  Moderate to marked narrowing of the right vertebral artery.  Left vertebral artery is dominant without high-grade stenosis.  PICAs not entirely imaged.  Appearance of moderate narrowing of the proximal basilar artery. Moderate narrowing right superior cerebellar artery.  Moderate focal narrowing proximal right posterior cerebral artery.  Poor delineation posterior cerebral artery distal branches bilaterally.  No aneurysm detected.  Code stroke was called initially upon arrival to hospital, found to be outside TPA window.  Neurology continued to follow throughout hospital course.  Enrolled in IMPACT study.  BP controlled as per neurology recommendations.  Gradual improvement of right sided paralysis through hospital course.  Currently able to move right upper and lower extremity with limitations and lift both for some time, however, still unable to make a fist or wiggle toes.  Evaluated by physical therapy and deemed to be a good candidate for inpatient rehab.  He was accepted to inpatient rehab and will be discharged there to which he is in agreement.  Initially given Aspirin and then transitioned to Clopidogrel 75mg  PO QD for secondary stroke prevention as per neurology.  Will continue IMPACT study until completion and can do so while in patient rehab.  Smoking cessation strongly advised and counseling given.  BP, DM, and Hyperlipidemia risk factor modifications ongoing, will need to follow  up with PCP and neurology as needed.     Hypertensive emergency--BP on arrival to ED was 206/125. Received Labetalol 5mg  x2 in ED. End  organ damage to kidneys and likely to brain. Chronic hypertension with systolic pressures > [id number] at baseline. He is followed at the [facility] and reports compliance with home meds initially but later found to be non-adherent. Uncontrolled hypertension may be related to renal disease, for which he is followed by a nephrologist at the [facility]). On home regimen: Hydralazine 10mg  TID, spirinolactone-HCTZ 25-25).  Continues to smoke cigarettes and marijuana.  Enrolled in IMPACT study by neurology during admission.  BP controlled initially as per neurology and will be discharged on Labetalol 300mg  PO TID and Hydralazine 10mg  PO TID.  HTN will need to be followed up as an outpatient with pcp and medications adjusted as needed with target goal BP<120/80.  Counseling for medication adherence given.     Stage 3 chronic renal impairment associated with type 2 diabetes mellitus--GFR on admission: 38, Cr: 2.16. Seen by nephrologist at [facility] 2 weeks prior.  Will need to follow up at [facility] and with PCP.  Renal function slowly improving during hospital course.  Cr on discharge 2.07 with GFR 40.  Was on metformin at home and has been discontinued. Baseline Cr appears between 1.5-2.      Tobacco abuse--continues to smoke approximately 1 pack per day since 1990 and also smoke marijuana.  Has been counseled extensive in the past for cessation and during this admission. Not willing to quit but will hopefully do so in the future.  Nicotine patch during admission.  Referred to 1800 QUIT NOW for resources as well.     Type 2 diabetes mellitus--Hb A1C during admission: 7.5.  Was on home regimen: glipizide 5mg  PRN and metformin 1000mg  BID.  Hx of medication non-adherence and poor diet.  Has been on SSI sensitive during hospital course with CBG monitoring.  Discontinued metformin given renal function and will restart glipizide 5mg  PO QD.  Medication will need to be adjusted depending on glycemic control with close follow up with PCP.   Goal Hb A1c<7.  Consider diabetes educator session if possible.     Marijuana abuse--UDS on admission positive for THC.  Counseled on cessation.     Hyperlipidemia: Lipid panel on admission: CHOL:206, TG:183, HDL:47, TG:183, VLDL:37.  Counseled on diet modification and started on Statin.  Will be discharged on Pravastatin 40mg  PO QD and adjust dose as needed by PCP.    Discharge Vitals:  BP 170/86  Pulse 70  Temp 97.9 F (36.6 C) (Oral)  Resp 12  Ht 6' (1.829 m)  Wt 185 lb 6.5 oz (84.1 kg)  BMI 25.15 kg/m2  SpO2 100%  Discharge Labs:  Results for orders placed during the hospital encounter of 08/23/12 (from the past 24 hour(s))  GLUCOSE, CAPILLARY     Status: Abnormal   Collection Time   08/24/12 12:12 PM      Component Value Range   Glucose-Capillary 217 (*) 70 - 99 mg/dL  GLUCOSE, CAPILLARY     Status: Abnormal   Collection Time   08/24/12  5:11 PM      Component Value Range   Glucose-Capillary 186 (*) 70 - 99 mg/dL  GLUCOSE, CAPILLARY     Status: Abnormal   Collection Time   08/24/12  9:35 PM      Component Value Range   Glucose-Capillary 195 (*) 70 - 99 mg/dL   Comment 1 Documented  in Chart     Comment 2 Notify RN    GLUCOSE, CAPILLARY     Status: Abnormal   Collection Time   08/25/12  8:38 AM      Component Value Range   Glucose-Capillary 266 (*) 70 - 99 mg/dL   Signed: Jerene Pitch 08/25/2012, 11:05 AM   Time Spent on Discharge: 35 minutes Services Ordered on Discharge: discharge to in patient rehab Equipment Ordered on Discharge: none

## 2012-08-25 NOTE — Progress Notes (Signed)
Stroke Team Progress Note  HISTORY Richard [name] is a 56 y.o. male who awoke this morning 08/23/2012 at 7 AM with some mild weakness on his right side. He went to sleep, and when he awoke at 9 AM his right eye was densely plegic. He has had no improvement since that time. Of note, he has similar episode 3 months ago and was given TPA with improvement. Patient was not a TPA candidate secondary to presenting outside the window. He was enrolled in the IMPACT trial. Due to malignant hypertension with BP 185/110, he was started on cardene. He was admitted to the neuro ICU for further evaluation and treatment.  SUBJECTIVE Rehab admissions coordinator in room.  OBJECTIVE Most recent Vital Signs: Filed Vitals:   08/25/12 0400 08/25/12 0500 08/25/12 0600 08/25/12 0700  BP: 131/65 132/65 102/55   Pulse: 65 62 63 72  Temp: 98.7 F (37.1 C)     TempSrc: Oral     Resp: 14 16 13 14   Height:      Weight:      SpO2: 99% 100% 100% 100%   CBG (last 3)  Basename 08/24/12 2135 08/24/12 1711 08/24/12 1212  GLUCAP 195* 186* 217*   IV Fluid Intake:     . [DISCONTINUED] niCARDipine 4 mg/hr (08/24/12 0838)  . [DISCONTINUED] niCARDipine Stopped (08/24/12 1030)   MEDICATIONS    . clopidogrel  75 mg Oral Q breakfast  . hydrALAZINE  10 mg Oral TID  . insulin aspart  0-9 Units Subcutaneous TID WC  . labetalol  200 mg Oral TID  . lidocaine  30 mL Intradermal Once  . nicotine  21 mg Transdermal Daily  . simvastatin  40 mg Oral q1800  . sodium chloride  3 mL Intravenous Q12H  . [DISCONTINUED] aspirin EC  325 mg Oral Daily  . [DISCONTINUED] insulin aspart  0-9 Units Subcutaneous TID WC   PRN:  sodium chloride, [COMPLETED] diphenhydrAMINE, labetalol, sodium chloride, traZODone, [DISCONTINUED] labetalol  Diet:  Carb Control thin liquids Activity:  Up with assistance DVT Prophylaxis:  SCDs   CLINICALLY SIGNIFICANT STUDIES Basic Metabolic Panel:   Lab [id number] 0625 08/23/12 1304  NA 137 138  K 3.9  4.1  CL 100 101  CO2 29 25  GLUCOSE 197* 173*  BUN 23 25*  CREATININE 2.07* 2.16*  CALCIUM 9.3 9.8  MG -- --  PHOS -- --   Liver Function Tests:   Lab 08/23/12 1304  AST 13  ALT 9  ALKPHOS 88  BILITOT 0.2*  PROT 7.3  ALBUMIN 3.3*   CBC:   Lab 08/23/12 1304  WBC 6.5  NEUTROABS 4.0  HGB 15.6  HCT 43.1  MCV 86.2  PLT 220   Coagulation:   Lab 08/23/12 1304  LABPROT 12.0  INR 0.89   Cardiac Enzymes:   Lab 08/24/12 0625 08/24/12 0031 08/23/12 1835  CKTOTAL -- -- --  CKMB -- -- --  CKMBINDEX -- -- --  TROPONINI <0.30 <0.30 <0.30   Urinalysis:   Lab 08/23/12 1346  COLORURINE YELLOW  LABSPEC 1.022  PHURINE 6.0  GLUCOSEU 250*  HGBUR TRACE*  BILIRUBINUR NEGATIVE  KETONESUR NEGATIVE  PROTEINUR >300*  UROBILINOGEN 1.0  NITRITE NEGATIVE  LEUKOCYTESUR NEGATIVE   Lipid Panel    Component Value Date/Time   CHOL 206* 08/24/2012 0625   TRIG 183* 08/24/2012 0625   HDL 47 08/24/2012 0625   CHOLHDL 4.4 08/24/2012 0625   VLDL 37 08/24/2012 0625   LDLCALC 122* 08/24/2012 0625   HgbA1C  Lab Results  Component Value Date   HGBA1C 7.5* 08/24/2012    Urine Drug Screen:     Component Value Date/Time   LABOPIA NONE DETECTED 08/23/2012 2124   COCAINSCRNUR NONE DETECTED 08/23/2012 2124   LABBENZ NONE DETECTED 08/23/2012 2124   AMPHETMU NONE DETECTED 08/23/2012 2124   THCU POSITIVE* 08/23/2012 2124   LABBARB NONE DETECTED 08/23/2012 2124    Alcohol Level: No results found for this basename: ETH:2 in the last 168 hours   CT of the brain  08/23/2012  1. No acute intracranial abnormality. 2.  Mildly advanced cerebral atrophy with small vessel ischemic change.   MRI of the brain  08/24/2012 Acute small to moderate size non hemorrhagic infarct posterior left corona radiata extends to the posterior aspect of the left external capsule.  MRA of the brain  08/24/2012 Intracranial atherosclerotic type changes  2D Echocardiogram  05/23/2012 EF 55-60% with no source of embolus.    Carotid Doppler  05/22/2012 No evidence of hemodynamically significant internal carotid artery stenosis. Vertebral artery flow is antegrade.   CXR  08/23/2012  No active cardiopulmonary disease.    EKG  normal sinus rhythm.   Therapy Recommendations PT - CIR; OT - CIR   Physical Exam   Present middle-aged African American male not in distress.Awake alert. Afebrile. Head is nontraumatic. Neck is supple without bruit. Hearing is normal. Cardiac exam no murmur or gallop. Lungs are clear to auscultation. Distal pulses are well felt.  Neurological Exam : awake alert oriented x3 with normal speech and language. No dysarthria. Extraocular moments are full range without nystagmus. Vision acuity and fields appear normal. Fundi were not visualized. Moderate right lower facial weakness. Tongue is midline. Moderate right hemiparesis with grade 3/5 strength. Coordination is slow on the right. Sensation appears preserved. Gait was not tested. NIH stroke scale score 8.  ASSESSMENT Richard [name] is a 56 y.o. male presenting with right hemiplegia. MRI comfirms a moderate size left corona radiata infarct. Infarct felt to be thromboticc secondary to small vessel disease in setting on medical noncompliance.  Enrolled in IMPACT trial. Stroke work up completed. On aspirin 81 mg orally every day prior to admission. Now on clopidogrel 75 mg orally every day for secondary stroke prevention. Patient with resultant right hemiplegia.  Hyperlipidemia, LDL 122, not on statin PTA, goal LDL < 100 THC positive on admission Malignant hypertension, BP > 185/110, started on cardene for BP control Migraines Neuropathy Diabetes, HgbA1c pending Hx TIA, no MRI findings, full workup at that time, s/p tPA Sept 2013 GERD Cigarette smoker  Hospital day # 2  TREATMENT/PLAN  Continue  clopidogrel 75 mg orally every day for secondary stroke prevention.  Complete IMPACT study. Today is D3 of 5. Can do on rehab.  Stop  smoking. Dr. Leonie Man counseled patient  Agreeable with plans for transfer to rehab today  Dr. Leonie Man discussed case with rehab and patient.  Teaching service will discharge.  Burnetta Sabin, MSN, RN, ANVP-BC, ANP-BC, [facility] Pager: [phone] 08/25/2012 8:24 AM   I have personally examined this patient,reviewed pertinent data and developed plan of care. I agree with above  Antony Contras, [facility] Pager: [phone] 08/25/2012 8:24 AM    08/25/2012 9:00 PM

## 2012-08-25 NOTE — Progress Notes (Signed)
UR COMPLETED  

## 2012-08-25 NOTE — Progress Notes (Signed)
Physical Therapy Progress Note   08/25/12 1500  PT Visit Information  Last PT Received On 08/25/12  Assistance Needed +2  PT Time Calculation  PT Start Time [id number]  PT Stop Time 1437  PT Time Calculation (min) 32 min  Subjective Data  Subjective "Can you take me outside to get out of this room?"  Patient Stated Goal to go home.  return to independent level  Precautions  Precautions Fall  Cognition  Overall Cognitive Status Appears within functional limits for tasks assessed/performed  Arousal/Alertness Awake/alert  Orientation Level Appears intact for tasks assessed  Behavior During Session [facility] for tasks performed  Bed Mobility  Bed Mobility Supine to Sit  Supine to Sit 4: Min assist;With rails  Details for Bed Mobility Assistance (A) to elevate trunk and maintain balance  Transfers  Transfers Sit to Stand;Stand to Sit  Sit to Stand 1: +2 Total assist;From bed  Sit to Stand: Patient Percentage 70%  Stand to Sit 1: +2 Total assist;To chair/3-in-1  Stand to Sit: Patient Percentage 70%  Details for Transfer Assistance +2 (A) to initiate standing but able to complete.  +2 (A) to maintain balance and prevent right knee buckling/hyperextension  Ambulation/Gait  Ambulation/Gait Assistance 1: +2 Total assist  Ambulation/Gait: Patient Percentage 60%  Ambulation Distance (Feet) 8 Feet  Assistive device 2 person hand held assist  Ambulation/Gait Assistance Details +2 (A) to maintain balance, advance bilateral LE, and proper weight shifts.  Manual cues to advance LE and prevent right knee buckling and hyperextension  Gait Pattern Step-to pattern;Decreased step length - right;Decreased step length - left;Decreased stance time - right;Shuffle;Narrow base of support;Trunk flexed  Stairs No  Modified Rankin (Stroke Patients Only)  Pre-Morbid Rankin Score 0  Modified Rankin 5  Balance  Balance Assessed Yes  Static Sitting Balance  Static Sitting - Balance Support Feet supported  Static  Sitting - Level of Assistance 5: Stand by assistance  Dynamic Standing Balance  Dynamic Standing - Balance Support During functional activity;Right upper extremity supported (right UE supported on straight back chair in back)  Dynamic Standing - Level of Assistance 1: +2 Total assist;Patient percentage (comment) (70%)  Dynamic Standing - Balance Activities Lateral lean/weight shifting (Pt standing to donn underwear and performing pericare)  PT - End of Session  Equipment Utilized During Treatment Gait belt  Activity Tolerance Patient tolerated treatment well  Patient left in chair;with call bell/phone within reach  Nurse Communication Mobility status  PT - Assessment/Plan  Comments on Treatment Session Pt able to ambulate this session with +2 (A) and max manual cues.  Pt performed ADLs in standing with (A) to maintain balance.  Pt continues to make progress however needs (A) to comoplete task.  Continue to highly recommend CIR at d/c.    PT Plan Discharge plan remains appropriate;Frequency remains appropriate  PT Frequency Min 4X/week  Recommendations for Other Services Rehab consult  Follow Up Recommendations CIR  Equipment Recommended None recommended by PT  Acute Rehab PT Goals  PT Goal Formulation With patient  Time For Goal Achievement 09/07/12  Potential to Achieve Goals Good  Pt will go Supine/Side to Sit with supervision  PT Goal: Supine/Side to Sit - Progress Progressing toward goal  Pt will go Sit to Supine/Side with supervision  PT Goal: Sit to Supine/Side - Progress Progressing toward goal  Pt will go Sit to Stand with min assist  PT Goal: Sit to Stand - Progress Progressing toward goal  Pt will go Stand to Sit with  min assist  PT Goal: Stand to Sit - Progress Progressing toward goal  Pt will Transfer Bed to Chair/Chair to Bed with min assist  PT Transfer Goal: Bed to Chair/Chair to Bed - Progress Progressing toward goal  Pt will Stand with min assist;3 - 5 min  PT  Goal: Stand - Progress Progressing toward goal  Pt will Ambulate 16 - 50 feet;with min assist;with least restrictive assistive device  PT Goal: Ambulate - Progress Progressing toward goal  PT General Charges  $$ ACUTE PT VISIT 1 Procedure  PT Treatments  $Gait Training 8-22 mins  $Neuromuscular Re-education 8-22 mins    No c/o pain  [place], [place] DPT [phone]

## 2012-08-25 NOTE — PMR Pre-admission (Signed)
PMR Admission Coordinator Pre-Admission Assessment  Patient: Richard [name] is an 56 y.o., male MRN: [id number] DOB: 02/25/1956 Height: 6' (182.9 cm) Weight: 84.1 kg (185 lb 6.5 oz)              Insurance Information  [place] Benefits (per patient)  Medicaid Application Date:  Pending      Case Manager:   Disability Application Date:  Pending      Case Worker:    Emergency Contact Information Contact Information    Name Relation Home Work Mobile   Hill,Mindy Significant other [phone]       Current Medical History  Patient Admitting Diagnosis:left subcortical infarct    History of Present Illness:  A 56 y.o. right-handed male with history of diet controlled diabetes mellitus as well as migraine headaches and CVA August of 2013 with little residual and did receive TPA, chronic renal insufficiency with baseline creatinine 2.10. Admitted 08/23/2012 with right-sided weakness. A CT scan showed no acute intracranial abnormalities and remote lacunar infarct. Neurology services has been consulted with echocardiogram and carotid Dopplers pending. Close monitoring of blood pressure 185/110 had been placed on nicardipine. Urine drug drug screen was positive for marijuana. Neurology services consulted suspect left brain subcortical infarct felt to be thrombotic secondary small vessel disease questionable medical noncompliance. Presently patient is maintained on Plavix therapy. A NicoDerm patch was added for history of tobacco abuse.   Total: 5 =NIH  Past Medical History  Past Medical History  Diagnosis Date  . Hypertension   . Arthritis   . GERD (gastroesophageal reflux disease)   . Migraines   . Neuropathy   . Ruptured lumbar disc   . Diabetes mellitus     diet controlled  . Stroke     Family History  family history is not on file.  Prior Rehab/Hospitalizations: Had outpatient theapy for knee about 10 years ago  Current Medications  Current facility-administered medications:0.9 %   sodium chloride infusion, 250 mL, Intravenous, PRN, Othella Boyer, MD;  clopidogrel (PLAVIX) tablet 75 mg, 75 mg, Oral, Q breakfast, Donzetta Starch, NP, 75 mg at 08/25/12 0851;  [COMPLETED] diphenhydrAMINE (BENADRYL) tablet 25 mg, 25 mg, Oral, QHS PRN, Hester Mates, MD, 25 mg at 08/24/12 2133;  hydrALAZINE (APRESOLINE) tablet 10 mg, 10 mg, Oral, TID, Donzetta Starch, NP, 10 mg at 08/25/12 0929 insulin aspart (novoLOG) injection 0-9 Units, 0-9 Units, Subcutaneous, TID WC, Janell Quiet, MD, 5 Units at 08/25/12 0850;  labetalol (NORMODYNE) tablet 200 mg, 200 mg, Oral, TID, Jerene Pitch, MD, 200 mg at 08/25/12 0929;  labetalol (NORMODYNE,TRANDATE) injection 10 mg, 10 mg, Intravenous, Q10 min PRN, Donzetta Starch, NP, 10 mg at 08/24/12 1222;  lidocaine (XYLOCAINE) 1 % injection 30 mL, 30 mL, Intradermal, Once, Garvin Fila, MD nicotine (NICODERM CQ - dosed in mg/24 hours) patch 21 mg, 21 mg, Transdermal, Daily, Neema Bobbie Stack, MD;  simvastatin (ZOCOR) tablet 40 mg, 40 mg, Oral, q1800, Jerene Pitch, MD;  sodium chloride 0.9 % injection 3 mL, 3 mL, Intravenous, Q12H, Neema K Sharda, MD, 3 mL at 08/25/12 0929;  sodium chloride 0.9 % injection 3 mL, 3 mL, Intravenous, PRN, Othella Boyer, MD traZODone (DESYREL) tablet 50 mg, 50 mg, Oral, QHS PRN, Blain Pais, MD, 50 mg at 08/25/12 0140;  [DISCONTINUED] insulin aspart (novoLOG) injection 0-9 Units, 0-9 Units, Subcutaneous, TID WC, Othella Boyer, MD, 2 Units at 08/24/12 1715;  [DISCONTINUED] labetalol (NORMODYNE,TRANDATE) injection 10 mg, 10 mg, Intravenous, Q10 min  PRN, Othella Boyer, MD, 10 mg at 08/23/12 1858 [DISCONTINUED] niCARdipine (CARDENE-IV) infusion (0.1 mg/ml), 5 mg/hr, Intravenous, Continuous, Donzetta Starch, NP, 2.5 mg/hr at 08/24/12 0941  Patients Current Diet: Carb Control  Precautions / Restrictions Precautions Precautions: Fall Restrictions Weight Bearing Restrictions: No   Prior Activity Level Limited Community (1-2x/wk): Went out 1-2  X a week  Development worker, international aid / Paramedic Devices/Equipment: None Home Adaptive Equipment: None  Prior Functional Level Prior Function Level of Independence: Independent Able to Take Stairs?: Yes Driving: Yes (But currently has no car.) 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  ) Comments: pt had CVA August 2013 however no residual affects  Current Functional Level Cognition  Arousal/Alertness: Awake/alert Overall Cognitive Status: Appears within functional limits for tasks assessed Overall Cognitive Status: Appears within functional limits for tasks assessed/performed Orientation Level: Oriented X4 Cognition - Other Comments:  (Pt became emotional at end of session and overwhelmed)    Extremity Assessment (includes Sensation/Coordination)  RUE ROM/Strength/Tone: Deficits RUE ROM/Strength/Tone Deficits: 2-/5 shoulder flexion, trace grip RUE Sensation: WFL - Light Touch  RLE ROM/Strength/Tone: Deficits RLE ROM/Strength/Tone Deficits: 2/5 gross RLE Sensation: WFL - Light Touch    ADLs       Mobility  Bed Mobility: Supine to Sit Supine to Sit: 3: Mod assist;With rails    Transfers  Transfers: Sit to Stand;Stand to Sit;Stand Pivot Transfers Sit to Stand: 1: +2 Total assist;From bed Sit to Stand: Patient Percentage: 50% Stand to Sit: 1: +2 Total assist;To chair/3-in-1 Stand to Sit: Patient Percentage: 50% Stand Pivot Transfers: 1: +2 Total assist Stand Pivot Transfers: Patient Percentage: 60%    Ambulation / Gait / Stairs / Wheelchair Mobility  Ambulation/Gait Ambulation/Gait Assistance: Not tested (comment)    Posture / Balance Static Sitting Balance Static Sitting - Balance Support: Feet supported Static Sitting - Level of Assistance: 4: Min assist;5: Stand by assistance Dynamic Sitting Balance Dynamic Sitting - Balance Support: Feet supported Dynamic Sitting - Level of Assistance: 4: Min assist Dynamic  Sitting Balance - Compensations: (A) to maintain balance cue to bilateral sway with scooting laterally    Special needs/care consideration BiPAP/CPAP No CPM No Continuous Drip IV No Dialysis No        Days No Life Vest No Oxygen No Special Bed No Trach Size No Wound Vac (area) No      Location No Skin No issues                       Bowel mgmt: Had BM on 12/02 Bladder mgmt: Voiding in urinal Diabetic mgmt yes - patient now on insulin during acute stay.     Previous Home Environment Living Arrangements: Spouse/significant other Lives With: Significant other Available Help at Discharge: Family Type of Home: Apartment Home Layout: One level Home Access: Stairs to enter Entrance Stairs-Rails: Right Entrance Stairs-Number of Steps: 14 Bathroom Shower/Tub: Chiropodist: Carter: No  Discharge Living Setting Plans for Discharge Living Setting: Lives with (comment);Apartment Type of Home at Discharge: Apartment Discharge Home Layout: One level Discharge Home Access: Stairs to enter Entrance Stairs-Number of Steps: 16 steps to get to 2nd level apartment Do you have any problems obtaining your medications?: No  Social/Family/Support Systems Patient Roles: Parent Contact Information: Carlena Hurl - Fiance ([phone] ([phone] Anticipated Caregiver: Fiance Ability/Limitations of Caregiver: Domingo Mend can assist, not currently working. Caregiver Availability: 24/7 Discharge Plan Discussed  with Primary Caregiver: Yes Is Caregiver In Agreement with Plan?: Yes Does Caregiver/Family have Issues with Lodging/Transportation while Pt is in Rehab?: Yes (Does not have a car.  Has to ask friends for rides.)    Goals/Additional Needs Patient/Family Goal for Rehab: PT/OT S/Min A, ST signed off on acute Expected length of stay: 2-3 weeks Cultural Considerations: Holiness Dietary Needs: Carb Mod Med Cal thin liquids Equipment Needs: TBD Pt/Family  Agrees to Admission and willing to participate: Yes Program Orientation Provided & Reviewed with Pt/Caregiver Including Roles  & Responsibilities: Yes   Decrease burden of Care through IP rehab admission:  Not applicable  Possible need for SNF placement upon discharge: Yes, has 16 stairs to 2nd level, so SNF may be needed depending on progress.   Patient Condition: This patient's condition remains as documented in the Consult dated 08/24/12, in which the Rehabilitation Physician determined and documented that the patient's condition is appropriate for intensive rehabilitative care in an inpatient rehabilitation facility.  Preadmission Screen Completed By:  Retta Diones, 08/25/2012 10:05 AM ______________________________________________________________________   Discussed status with Dr. Naaman Plummer on 08/25/12 at 10 and received telephone approval for admission today.  Admission Coordinator:  Retta Diones, time1020/Date12/04/13

## 2012-08-25 NOTE — Progress Notes (Signed)
Acute OT Eval   08/25/12 1524  OT Visit Information  Last OT Received On 08/25/12  Assistance Needed +2  PT/OT Co-Evaluation/Treatment Yes  OT Time Calculation  OT Start Time 1405  OT Stop Time 1437  OT Time Calculation (min) 32 min  Subjective Data  Subjective I'm ready to start moving around more. I want this right hand to start working.  Patient Stated Goal Return home  Precautions  Precautions Fall  Restrictions  Weight Bearing Restrictions No  Home Living  Lives With Significant other  Available Help at Discharge Family  Type of [place] to enter  Entrance Stairs-Number of Steps 14  Entrance Stairs-Rails Right  Home Layout One level  Bathroom Information systems manager None  Prior Function  Level of Independence Independent  Able to Take Stairs? 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 difficulties  ADL  Grooming Dance movement psychotherapist;Set up;Supervision/safety  Where Assessed - Grooming Supported sitting  Lower Body Bathing Minimal assistance (to maintain balance in standing)  Where Assessed - Lower Body Bathing Supported sit to stand  Lower Body Dressing Maximal assistance (don/doff underwear and socks)  Where Assessed - Lower Body Dressing Supported sit to stand  Toilet Transfer +2 Total assistance  Toilet Transfer: Patient Percentage 60%  Toilet Transfer Method Sit to Surveyor, quantity (bed to chair)  Toileting - Clothing Manipulation and Hygiene Moderate assistance  Where Assessed - Best boy and Hygiene Standing  Equipment Used Gait belt  Transfers/Ambulation Related to ADLs +2total(pt=60%) with ambulation ~5steps  ADL Comments pt motivated to work with therapy  Vision - Assessment  Additional Comments NT this session  Cognition  Overall Cognitive Status Appears within  functional limits for tasks assessed/performed  Arousal/Alertness Awake/alert  Orientation Level Appears intact for tasks assessed  Behavior During Session [facility] for tasks performed  Right Upper Extremity Assessment  RUE ROM/Strength/Tone Deficits  RUE ROM/Strength/Tone Deficits 1/5 (trace) shoulder and distally  Left Upper Extremity Assessment  LUE ROM/Strength/Tone WFL  LUE Sensation WFL - Light Touch  LUE Coordination WFL - gross/fine motor  Transfers  Sit to Stand 1: +2 Total assist;From bed  Sit to Stand: Patient Percentage 70%  Stand to Sit 1: +2 Total assist;To chair/3-in-1  Stand to Sit: Patient Percentage 70%  Details for Transfer Assistance +2 (A) to initiate standing but able to complete.  +2 (A) to maintain balance and prevent right knee buckling/hyperextension  OT - End of Session  Equipment Utilized During Treatment Gait belt  Activity Tolerance Patient tolerated treatment well  Patient left in chair;with call bell/phone within reach  Nurse Communication Mobility status  OT Assessment  Clinical Impression Statement Pt admitted with right sided weakness with MRI revealing Acute small to moderate size non hemorrhagic infarct posterior left corona radiata extends to the posterior aspect of the left external capsule. Pt to d/c to CIR today therefore will defer OT treatment to next venue of care.   OT Recommendation/Assessment All further OT needs can be met in the next venue of care  OT Therapy Diagnosis  Generalized weakness;Cognitive deficits;Hemiplegia dominant side  OT Recommendation  Recommendations for Other Services Rehab consult  Follow Up Recommendations CIR  Equipment Recommended None recommended by OT;None recommended by PT;None recommended by SLP  OT General Charges  $OT Visit 1 Procedure  OT Evaluation  $Initial  OT Evaluation Tier I 1 Procedure  OT Treatments  $Self Care/Home Management  23-37 mins  Written Expression  Dominant Hand Right

## 2012-08-25 NOTE — H&P (Signed)
Physical Medicine and Rehabilitation Admission H&P  Chief Complaint   Patient presents with   .  Cerebrovascular Accident   :  HPI: Richard [name] is a 56 y.o. 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 Admitted 08/23/2012 with right-sided weakness. A CT scan showed no acute intracranial abnormalities and remote lacunar infarct. Neurology services has been consulted with review of most recent echocardiogram completed during workup of stroke in August of 2013 showed ejection fraction of [id number] grade 2 diastolic dysfunction and carotid Doppler showed no ICA stenosis.. Close monitoring of blood pressure 185/110 had been placed on nicardipine. Urine drug drug screen was positive for marijuana. Neurology services consulted suspect left brain subcortical infarct felt to be thrombotic secondary small vessel disease questionable medical noncompliance. Presently patient is maintained on Plavix therapy. A NicoDerm patch was added for history of tobacco abuse. Physical and occupational therapy evaluations completed an ongoing. Speech therapy evaluation completed 08/24/2012 and signed off. M.D. is requested physical medicine rehabilitation consult to consider inpatient rehabilitation services. Patient was felt to be a good candidate for inpatient rehabilitation services and was admitted for a comprehensive rehabilitation program  Review of Systems  Neurological: Positive for dizziness and headaches.  All other systems reviewed and are negative  Past Medical History   Diagnosis  Date   .  Hypertension    .  Arthritis    .  GERD (gastroesophageal reflux disease)    .  Migraines    .  Neuropathy    .  Ruptured lumbar disc    .  Diabetes mellitus      diet controlled   .  Stroke     Past Surgical History   Procedure  Date   .  Knee surgery      bil     No family history on file.  Social History: reports that he has been smoking Cigarettes. He has been smoking about 1 pack per day. He does not have any smokeless tobacco history on file. He reports that he uses illicit drugs (Marijuana). He reports that he does not drink alcohol.  Allergies: No Known Allergies  Medications Prior to Admission   Medication  Sig  Dispense  Refill   .  Cholecalciferol (VITAMIN D) 2000 UNITS CAPS  Take 1 capsule by mouth daily.     Marland Kitchen  glipiZIDE (GLUCOTROL XL) 5 MG 24 hr tablet  Take 5 mg by mouth daily as needed. Takes if blood sugar is elevated     .  hydrALAZINE (APRESOLINE) 10 MG tablet  Take 10 mg by mouth 3 (three) times daily.     .  metFORMIN (GLUCOPHAGE) 1000 MG tablet  Take 1,000 mg by mouth 2 (two) times daily as needed. Takes if blood sugar is elevated     .  omeprazole (PRILOSEC) 20 MG capsule  Take 20 mg by mouth daily.     .  ranitidine (ZANTAC) 300 MG tablet  Take 300 mg by mouth 2 (two) times daily.     Marland Kitchen  spironolactone-hydrochlorothiazide (ALDACTAZIDE) 25-25 MG per tablet  Take 1 tablet by mouth daily.     .  [DISCONTINUED] aspirin EC 81 MG tablet  Take 81 mg by mouth daily.     .  pantoprazole (PROTONIX) 40 MG tablet  Take 40 mg by mouth daily.      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 )  Comments: pt had CVA August 2013 however no residual affects  Functional Status:  Mobility:  Bed Mobility  Bed Mobility: Supine to Sit  Supine to Sit: 3: Mod assist;With rails  Transfers  Transfers: Sit to Stand;Stand  to [employer] Transfers  Sit to Stand: 1: +2 Total assist;From bed  Sit to Stand: Patient Percentage: 50%  Stand to Sit: 1: +2 Total assist;To chair/3-in-1  Stand to Sit: Patient Percentage: 50%  Stand Pivot Transfers: 1: +2 Total assist  Stand Pivot Transfers: Patient Percentage: 60%  Ambulation/Gait  Ambulation/Gait Assistance: Not tested (comment)   ADL:   Cognition:  Cognition  Overall Cognitive Status: Appears within functional limits for tasks assessed  Arousal/Alertness: Awake/alert  Orientation Level: Oriented X4  Cognition  Overall Cognitive Status: Appears within functional limits for tasks assessed/performed  Arousal/Alertness: Awake/alert  Orientation Level: Appears intact for tasks assessed  Behavior During Session: [facility] for tasks performed  Cognition - Other Comments: (Pt became emotional at end of session and overwhelmed)  Blood pressure 181/96, pulse 79, temperature 97.9 F (36.6 C), temperature source Oral, resp. rate 19, height 6' (1.829 m), weight 84.1 kg (185 lb 6.5 oz), SpO2 100.00%.  Physical Exam  Vitals reviewed.  Constitutional: He is oriented to person, place, and time.  HENT:  Head: Normocephalic.  Eyes:  Pupils round and reactive to light  Neck: Normal range of motion. Neck supple. No thyromegaly present.  Cardiovascular: Normal rate and regular rhythm.  Pulmonary/Chest: Effort normal and breath sounds normal. No respiratory distress.  Abdominal: Soft. Bowel sounds are normal. There is no tenderness.  Neurological: He is alert and oriented to person, place, and time.  Follow simple commands. Dense right HP. Does display 1 to 1+ strength in right deltoid, bicep in flexor synergy pattern. Distal RUE is 0.5. Right HF is 1/5 and again distally is 0/5. DTR's are 2+ to 3+ on the right. Senses pain in the right arm, leg. No facial droop or tongue deviation. Cognitively appropriate. Speech is clear. Skin: Skin is warm and dry.  Psychiatric: He has a  normal mood and affect. Slightly anxious at times.  Results for orders placed during the hospital encounter of 08/23/12 (from the past 48 hour(s))   CBC WITH DIFFERENTIAL Status: Abnormal    Collection Time    08/23/12 1:04 PM   Component  Value  Range  Comment    WBC  6.5  4.0 - 10.5 K/uL     RBC  5.00  4.22 - 5.81 MIL/uL     Hemoglobin  15.6  13.0 - 17.0 g/dL     HCT  43.1  39.0 - 52.0 %     MCV  86.2  78.0 - 100.0 fL     MCH  31.2  26.0 - 34.0 pg     MCHC  36.2 (*)  30.0 - 36.0 g/dL     RDW  12.9  11.5 - 15.5 %     Platelets  220  150 - 400 K/uL  Neutrophils Relative  61  43 - 77 %     Neutro Abs  4.0  1.7 - 7.7 K/uL     Lymphocytes Relative  29  12 - 46 %     Lymphs Abs  1.9  0.7 - 4.0 K/uL     Monocytes Relative  8  3 - 12 %     Monocytes Absolute  0.5  0.1 - 1.0 K/uL     Eosinophils Relative  1  0 - 5 %     Eosinophils Absolute  0.1  0.0 - 0.7 K/uL     Basophils Relative  1  0 - 1 %     Basophils Absolute  0.0  0.0 - 0.1 K/uL    COMPREHENSIVE METABOLIC PANEL Status: Abnormal    Collection Time    08/23/12 1:04 PM   Component  Value  Range  Comment    Sodium  138  135 - 145 mEq/L     Potassium  4.1  3.5 - 5.1 mEq/L     Chloride  101  96 - 112 mEq/L     CO2  25  19 - 32 mEq/L     Glucose, Bld  173 (*)  70 - 99 mg/dL     BUN  25 (*)  6 - 23 mg/dL     Creatinine, Ser  2.16 (*)  0.50 - 1.35 mg/dL     Calcium  9.8  8.4 - 10.5 mg/dL     Total Protein  7.3  6.0 - 8.3 g/dL     Albumin  3.3 (*)  3.5 - 5.2 g/dL     AST  13  0 - 37 U/L     ALT  9  0 - 53 U/L     Alkaline Phosphatase  88  39 - 117 U/L     Total Bilirubin  0.2 (*)  0.3 - 1.2 mg/dL     GFR calc non Af Amer  33 (*)  >90 mL/min     GFR calc Af Amer  38 (*)  >90 mL/min    PROTIME-INR Status: Normal    Collection Time    08/23/12 1:04 PM   Component  Value  Range  Comment    Prothrombin Time  12.0  11.6 - 15.2 seconds     INR  0.89  0.00 - 1.49    APTT Status: Normal    Collection Time    08/23/12 1:04 PM    Component  Value  Range  Comment    aPTT  27  24 - 37 seconds    URINALYSIS, ROUTINE W REFLEX MICROSCOPIC Status: Abnormal    Collection Time    08/23/12 1:46 PM   Component  Value  Range  Comment    Color, Urine  YELLOW  YELLOW     APPearance  CLEAR  CLEAR     Specific Gravity, Urine  1.022  1.005 - 1.030     pH  6.0  5.0 - 8.0     Glucose, UA  250 (*)  NEGATIVE mg/dL     Hgb urine dipstick  TRACE (*)  NEGATIVE     Bilirubin Urine  NEGATIVE  NEGATIVE     Ketones, ur  NEGATIVE  NEGATIVE mg/dL     Protein, ur  >300 (*)  NEGATIVE mg/dL     Urobilinogen, UA  1.0  0.0 - 1.0 mg/dL     Nitrite  NEGATIVE  NEGATIVE  Leukocytes, UA  NEGATIVE  NEGATIVE    URINE MICROSCOPIC-ADD ON Status: Normal    Collection Time    08/23/12 1:46 PM   Component  Value  Range  Comment    Squamous Epithelial / LPF  RARE  RARE     WBC, UA  0-2  <3 WBC/hpf     RBC / HPF  3-6  <3 RBC/hpf     Bacteria, UA  RARE  RARE    MRSA PCR SCREENING Status: Normal    Collection Time    08/23/12 6:32 PM   Component  Value  Range  Comment    MRSA by PCR  NEGATIVE  NEGATIVE    TROPONIN I Status: Normal    Collection Time    08/23/12 6:35 PM   Component  Value  Range  Comment    Troponin I  <0.30  <0.30 ng/mL    GLUCOSE, CAPILLARY Status: Abnormal    Collection Time    08/23/12 6:58 PM   Component  Value  Range  Comment    Glucose-Capillary  109 (*)  70 - 99 mg/dL     Comment 1  Notify RN     URINE RAPID DRUG SCREEN (HOSP PERFORMED) Status: Abnormal    Collection Time    08/23/12 9:24 PM   Component  Value  Range  Comment    Opiates  NONE DETECTED  NONE DETECTED     Cocaine  NONE DETECTED  NONE DETECTED     Benzodiazepines  NONE DETECTED  NONE DETECTED     Amphetamines  NONE DETECTED  NONE DETECTED     Tetrahydrocannabinol  POSITIVE (*)  NONE DETECTED     Barbiturates  NONE DETECTED  NONE DETECTED    GLUCOSE, CAPILLARY Status: Abnormal    Collection Time    08/23/12 10:01 PM   Component  Value  Range  Comment     Glucose-Capillary  176 (*)  70 - 99 mg/dL     Comment 1  Notify RN      Comment 2  Documented in Chart     TROPONIN I Status: Normal    Collection Time    08/24/12 12:31 AM   Component  Value  Range  Comment    Troponin I  <0.30  <0.30 ng/mL    BASIC METABOLIC PANEL Status: Abnormal    Collection Time    08/24/12 6:25 AM   Component  Value  Range  Comment    Sodium  137  135 - 145 mEq/L     Potassium  3.9  3.5 - 5.1 mEq/L     Chloride  100  96 - 112 mEq/L     CO2  29  19 - 32 mEq/L     Glucose, Bld  197 (*)  70 - 99 mg/dL     BUN  23  6 - 23 mg/dL     Creatinine, Ser  2.07 (*)  0.50 - 1.35 mg/dL     Calcium  9.3  8.4 - 10.5 mg/dL     GFR calc non Af Amer  35 (*)  >90 mL/min     GFR calc Af Amer  40 (*)  >90 mL/min    TROPONIN I Status: Normal    Collection Time    08/24/12 6:25 AM   Component  Value  Range  Comment    Troponin I  <0.30  <0.30 ng/mL    TSH Status: Normal    Collection Time  08/24/12 6:25 AM   Component  Value  Range  Comment    TSH  0.833  0.350 - 4.500 uIU/mL    HIV ANTIBODY (ROUTINE TESTING) Status: Normal    Collection Time    08/24/12 6:25 AM   Component  Value  Range  Comment    HIV  NON REACTIVE  NON REACTIVE    HEMOGLOBIN A1C Status: Abnormal    Collection Time    08/24/12 6:25 AM   Component  Value  Range  Comment    Hemoglobin A1C  7.5 (*)  <5.7 %     Mean Plasma Glucose  169 (*)  <117 mg/dL    LIPID PANEL Status: Abnormal    Collection Time    08/24/12 6:25 AM   Component  Value  Range  Comment    Cholesterol  206 (*)  0 - 200 mg/dL     Triglycerides  183 (*)  <150 mg/dL     HDL  47  >39 mg/dL     Total CHOL/HDL Ratio  4.4      VLDL  37  0 - 40 mg/dL     LDL Cholesterol  122 (*)  0 - 99 mg/dL    GLUCOSE, CAPILLARY Status: Abnormal    Collection Time    08/24/12 8:27 AM   Component  Value  Range  Comment    Glucose-Capillary  171 (*)  70 - 99 mg/dL    GLUCOSE, CAPILLARY Status: Abnormal    Collection Time    08/24/12 12:12 PM    Component  Value  Range  Comment    Glucose-Capillary  217 (*)  70 - 99 mg/dL    GLUCOSE, CAPILLARY Status: Abnormal    Collection Time    08/24/12 5:11 PM   Component  Value  Range  Comment    Glucose-Capillary  186 (*)  70 - 99 mg/dL    GLUCOSE, CAPILLARY Status: Abnormal    Collection Time    08/24/12 9:35 PM   Component  Value  Range  Comment    Glucose-Capillary  195 (*)  70 - 99 mg/dL     Comment 1  Documented in Chart      Comment 2  Notify RN     GLUCOSE, CAPILLARY Status: Abnormal    Collection Time    08/25/12 8:38 AM   Component  Value  Range  Comment    Glucose-Capillary  266 (*)  70 - 99 mg/dL     Dg Chest 2 View  08/23/2012 *RADIOLOGY REPORT* Clinical Data: Acute stroke. Right-sided numbness. CHEST - 2 VIEW Comparison: 05/22/2012 Findings: The heart size and mediastinal contours are within normal limits. Both lungs are clear. The visualized skeletal structures are unremarkable. IMPRESSION: No active cardiopulmonary disease. Original Report Authenticated By: Earle Gell, M.D.  Ct Head Wo Contrast  08/23/2012 *RADIOLOGY REPORT* Clinical Data: Right-sided weakness. CT HEAD WITHOUT CONTRAST Technique: Contiguous axial images were obtained from the base of the skull through the vertex without contrast. Comparison: 05/22/2012 MRI. CT of 05/21/2012. Findings: Bone windows demonstrate mucous retention cyst or polyp in the left maxillary sinus. Petrous apices are aerated. Clear mastoid air cells. Soft tissue windows demonstrate mildly age advanced cerebral atrophy. Mild low density in the periventricular white matter likely related to small vessel disease. Arachnoid cyst adjacent the left frontal lobe is chronic and measures 2.2 cm maximally. No hemorrhage, hydrocephalus, intra-axial, or extra-axial fluid collection. No convincing evidence of acute infarct. IMPRESSION: 1. No acute  intracranial abnormality. 2. Mildly advanced cerebral atrophy with small vessel ischemic change. Findings called  to Dr. Leonel Ramsay at 1:06 p.m. Original Report Authenticated By: Abigail Miyamoto, M.D.  [name]  08/24/2012 *RADIOLOGY REPORT* Clinical Data: Right-sided weakness. Prior TPA 04/2012. Hypertension. MRI BRAIN WITHOUT CONTRAST MRA HEAD WITHOUT CONTRAST Technique: Multiplanar, multiecho pulse sequences of the brain and surrounding structures were obtained according to standard protocol without intravenous contrast. Angiographic images of the head were obtained using MRA technique without contrast. Comparison: 08/23/2012 CT. 05/22/2012 MR. MRI HEAD Findings: Acute small to moderate size non hemorrhagic infarct posterior left corona radiata extends to the posterior aspect of the left external capsule. Tiny area of blood breakdown products left cerebellum unchanged probably related to remote hemorrhagic ischemia otherwise no evidence of intracranial hemorrhage. Remote small infarct mid left corona radiata and anterior right corona radiata. Small vessel disease type changes. Anterior left frontal arachnoid cyst unchanged with scalloping of the adjacent calvarium. Polypoid opacification inferior aspect of the left maxillary sinus. Minimal mucosal thickening ethmoid sinus air cells. Mild exophthalmos. No hydrocephalus. IMPRESSION: Acute small to moderate size non hemorrhagic infarct posterior left corona radiata extends to the posterior aspect of the left external capsule. Please see above. This has been made a PRA call report utilizing dashboard call feature. MRA HEAD Findings: 50% narrowing proximal cavernous segment right internal carotid artery once again noted. Middle cerebral artery branch vessel irregularity more notable on the left where there are decreased number of visualized branches. Moderate to marked narrowing of the right vertebral artery. Left vertebral artery is dominant without high-grade stenosis. PICAs not entirely imaged. Appearance of moderate narrowing of the proximal basilar artery. Artifact  may partially contribute to this appearance. Nonvisualization AICAs. Moderate narrowing right superior cerebellar artery. Moderate focal narrowing proximal right posterior cerebral artery. Poor delineation posterior cerebral artery distal branches bilaterally. No aneurysm detected. IMPRESSION: Intracranial atherosclerotic type changes as detailed above. Original Report Authenticated By: Genia Del, M.D.  Mr Mra Head/brain Wo Cm  08/24/2012 *RADIOLOGY REPORT* Clinical Data: Right-sided weakness. Prior TPA 04/2012. Hypertension. MRI BRAIN WITHOUT CONTRAST MRA HEAD WITHOUT CONTRAST Technique: Multiplanar, multiecho pulse sequences of the brain and surrounding structures were obtained according to standard protocol without intravenous contrast. Angiographic images of the head were obtained using MRA technique without contrast. Comparison: 08/23/2012 CT. 05/22/2012 MR. MRI HEAD Findings: Acute small to moderate size non hemorrhagic infarct posterior left corona radiata extends to the posterior aspect of the left external capsule. Tiny area of blood breakdown products left cerebellum unchanged probably related to remote hemorrhagic ischemia otherwise no evidence of intracranial hemorrhage. Remote small infarct mid left corona radiata and anterior right corona radiata. Small vessel disease type changes. Anterior left frontal arachnoid cyst unchanged with scalloping of the adjacent calvarium. Polypoid opacification inferior aspect of the left maxillary sinus. Minimal mucosal thickening ethmoid sinus air cells. Mild exophthalmos. No hydrocephalus. IMPRESSION: Acute small to moderate size non hemorrhagic infarct posterior left corona radiata extends to the posterior aspect of the left external capsule. Please see above. This has been made a PRA call report utilizing dashboard call feature. MRA HEAD Findings: 50% narrowing proximal cavernous segment right internal carotid artery once again noted. Middle cerebral artery  branch vessel irregularity more notable on the left where there are decreased number of visualized branches. Moderate to marked narrowing of the right vertebral artery. Left vertebral artery is dominant without high-grade stenosis. PICAs not entirely imaged. Appearance of moderate narrowing of the proximal basilar artery. Artifact may  partially contribute to this appearance. Nonvisualization AICAs. Moderate narrowing right superior cerebellar artery. Moderate focal narrowing proximal right posterior cerebral artery. Poor delineation posterior cerebral artery distal branches bilaterally. No aneurysm detected. IMPRESSION: Intracranial atherosclerotic type changes as detailed above. Original Report Authenticated By: Genia Del, M.D.   Post Admission Physician Evaluation:  1. Functional deficits secondary to thrombotic left subcortical infarct. 2. Patient is admitted to receive collaborative, interdisciplinary care between the physiatrist, rehab nursing staff, and therapy team. 3. Patient's level of medical complexity and substantial therapy needs in context of that medical necessity cannot be provided at a lesser intensity of care such as a SNF. 4. Patient has experienced substantial functional loss from his/her baseline which was documented above under the "Functional History" and "Functional Status" headings. Judging by the patient's diagnosis, physical exam, and functional history, the patient has potential for functional progress which will result in measurable gains while on inpatient rehab. These gains will be of substantial and practical use upon discharge in facilitating mobility and self-care at the household level. 5. Physiatrist will provide 24 hour management of medical needs as well as oversight of the therapy plan/treatment and provide guidance as appropriate regarding the interaction of the two. 6. 24 hour rehab nursing will assist with bladder management, bowel management, safety, skin/wound  care, disease management, medication administration and patient education and help integrate therapy concepts, techniques,education, etc. 7. PT will assess and treat for: Lower extremity strength, range of motion, stamina, balance, functional mobility, safety, adaptive techniques and equipment, NMR, education. Goals are: supervision to min assist. 8. OT will assess and treat for: ADL's, functional mobility, safety, upper extremity strength, adaptive techniques and equipment, NMR, education. Goals are: supervision to min assist. 9. SLP will assess and treat for: n/a. Goals are: n/a. 10. Case Management and Social Worker will assess and treat for psychological issues and discharge planning. 11. Team conference will be held weekly to assess progress toward goals and to determine barriers to discharge. 12. Patient will receive at least 3 hours of therapy per day at least 5 days per week. 13. ELOS: 2-3 weeks Prognosis: excellent Medical Problem List and Plan:  1. Left thrombotic subcortical infarct  2. DVT Prophylaxis/Anticoagulation: SCDs. Monitor for any signs of DVT  3. Neuropsych: This patient is capable of making decisions on his/her own behalf.  -will need some help with coping skills -team to provide egosupport as needed 4. Hypertension. Hydralazine 10 mg 3 times a day, labetalol 200 mg 3 times a day. Monitor with increased activity  5. Diabetes mellitus. Hemoglobin A1c 7.5. Presently on sliding scale insulin. Check blood sugars a.c. and at bedtime.  Patient on Glucophage 1000 mg twice daily prior to admission and will resume as tolerated  6. Hyperlipidemia. Zocor  7. Tobacco abuse/marijuana. NicoDerm patch and provide drug Counseling  8. Chronic renal insufficiency with baseline creatinine 2.10. Latest creatinine 2.07 08/24/2012 a followup chemistries  08/25/2012

## 2012-08-25 NOTE — Progress Notes (Signed)
Patient admit to inpatient rehab at 1500 from 3100.  Patient alert and oriented, admission package provide with explanation.  Orient to unit, educate patient about safety and call for assistance, call bell within reach, patient sitting in the recliner.

## 2012-08-25 NOTE — Progress Notes (Signed)
Subjective: Mr. Richard [name] was seen and examined at bedside. He is a little stressed due to issues at home he claims.  In terms of his mobility on the right side, he notices improvement daily.  He is able to lift his right arm more off the bed today and keep it up for some time but is still unable to make a fist or wiggle his toes.   He is tolerating diet well and wishes to go home soon.  He is agreeable to in patient rehab for which he has a bed available at this time.  He has no other major complaints at this time.  He denies any N/V/D, chest pain, shortness of breath, headaches, or abdominal pain at this time.    Objective: Vital signs in last 24 hours: Filed Vitals:   08/25/12 0700 08/25/12 0830 08/25/12 0900 08/25/12 1000  BP:  181/96 173/82 170/86  Pulse: 72 79 72 70  Temp:   97.9 F (36.6 C)   TempSrc:   Oral   Resp: 14 19 17 12   Height:      Weight:      SpO2: 100% 100% 100% 100%   Weight change:   Intake/Output Summary (Last 24 hours) at 08/25/12 1024 Last data filed at 08/25/12 0800  Gross per 24 hour  Intake    420 ml  Output   1075 ml  Net   -655 ml   Vitals reviewed. General: resting in bed, NAD HEENT: PERRLA, EOMI, no scleral icterus Cardiac: RRR, no rubs, murmurs or gallops Pulm: clear to auscultation bilaterally, no wheezes, rales, or rhonchi Abd: soft, nontender, nondistended, BS present Ext: warm and well perfused, no pedal edema, +2DP B/L, improved mobility of right upper and lower extremity.  Able to lift right hand off bed slightly more than yesterday and move RLE, but cannot make fist or wiggle toes.   Neuro: alert and oriented X3, cranial nerves II-XII grossly intact, however, slight right facial asymmetry notable upon puffing cheeks. Strength 5/5 left upper and lower extremity. Sensation to light touch equal in bilateral upper and lower extremities. 1/5 strength in RUE and RLE.    Lab Results: Basic Metabolic Panel:  Lab [id number] 0625 08/23/12 1304  NA 137  138  K 3.9 4.1  CL 100 101  CO2 29 25  GLUCOSE 197* 173*  BUN 23 25*  CREATININE 2.07* 2.16*  CALCIUM 9.3 9.8  MG -- --  PHOS -- --   Liver Function Tests:  Lab 08/23/12 1304  AST 13  ALT 9  ALKPHOS 88  BILITOT 0.2*  PROT 7.3  ALBUMIN 3.3*   CBC:  Lab 08/23/12 1304  WBC 6.5  NEUTROABS 4.0  HGB 15.6  HCT 43.1  MCV 86.2  PLT 220   Cardiac Enzymes:  Lab 08/24/12 0625 08/24/12 0031 08/23/12 1835  CKTOTAL -- -- --  CKMB -- -- --  CKMBINDEX -- -- --  TROPONINI <0.30 <0.30 <0.30   CBG:  Lab 08/25/12 0838 08/24/12 2135 08/24/12 1711 08/24/12 1212 08/24/12 0827 08/23/12 2201  GLUCAP 266* 195* 186* 217* 171* 176*   Fasting Lipid Panel:  Lab 08/24/12 0625  CHOL 206*  HDL 47  LDLCALC 122*  TRIG 183*  CHOLHDL 4.4  LDLDIRECT --   Coagulation:  Lab 08/23/12 1304  LABPROT 12.0  INR 0.89   Urine Drug Screen: Drugs of Abuse     Component Value Date/Time   LABOPIA NONE DETECTED 08/23/2012 2124   [place] DETECTED 08/23/2012 2124  LABBENZ NONE DETECTED 08/23/2012 2124   AMPHETMU NONE DETECTED 08/23/2012 2124   THCU POSITIVE* 08/23/2012 2124   LABBARB NONE DETECTED 08/23/2012 2124    Urinalysis:  Lab 08/23/12 1346  COLORURINE YELLOW  LABSPEC 1.022  PHURINE 6.0  GLUCOSEU 250*  HGBUR TRACE*  BILIRUBINUR NEGATIVE  KETONESUR NEGATIVE  PROTEINUR >300*  UROBILINOGEN 1.0  NITRITE NEGATIVE  LEUKOCYTESUR NEGATIVE   Micro Results: Recent Results (from the past 240 hour(s))  MRSA PCR SCREENING     Status: Normal   Collection Time   08/23/12  6:32 PM      Component Value Range Status Comment   MRSA by PCR NEGATIVE  NEGATIVE Final    Studies/Results: Dg Chest 2 View  08/23/2012  *RADIOLOGY REPORT*  Clinical Data: Acute stroke.  Right-sided numbness.  CHEST - 2 VIEW  Comparison:  05/22/2012  Findings:  The heart size and mediastinal contours are within normal limits.  Both lungs are clear.  The visualized skeletal structures are unremarkable.   IMPRESSION: No active cardiopulmonary disease.   Original Report Authenticated By: Earle Gell, M.D.    Ct Head Wo Contrast  08/23/2012  *RADIOLOGY REPORT*  Clinical Data: Right-sided weakness.  CT HEAD WITHOUT CONTRAST  Technique:  Contiguous axial images were obtained from the base of the skull through the vertex without contrast.  Comparison: 05/22/2012 MRI.  CT of 05/21/2012.  Findings: Bone windows demonstrate mucous retention cyst or polyp in the left maxillary sinus.  Petrous apices are aerated. Clear mastoid air cells.  Soft tissue windows demonstrate mildly age advanced cerebral atrophy.  Mild low density in the periventricular white matter likely related to small vessel disease.  Arachnoid cyst adjacent the left frontal lobe is chronic and measures 2.2 cm maximally.  No hemorrhage, hydrocephalus, intra-axial, or extra-axial fluid collection.  No convincing evidence of acute infarct.  IMPRESSION:  1. No acute intracranial abnormality. 2.  Mildly advanced cerebral atrophy with small vessel ischemic change.  Findings called to Dr. Leonel Ramsay at 1:06 p.m.   Original Report Authenticated By: Abigail Miyamoto, M.D.    [name]  08/24/2012  *RADIOLOGY REPORT*  Clinical Data:  Right-sided weakness.  Prior TPA 04/2012. Hypertension.  MRI BRAIN WITHOUT CONTRAST MRA HEAD WITHOUT CONTRAST  Technique: Multiplanar, multiecho pulse sequences of the brain and surrounding structures were obtained according to standard protocol without intravenous contrast.  Angiographic images of the head were obtained using MRA technique without contrast.  Comparison: 08/23/2012 CT.   05/22/2012 MR.  MRI HEAD  Findings:  Acute small to moderate size non hemorrhagic infarct posterior left corona radiata extends to the posterior aspect of the left external capsule.  Tiny area of blood breakdown products left cerebellum unchanged probably related to remote hemorrhagic ischemia otherwise no evidence of intracranial hemorrhage.   Remote small infarct mid left corona radiata and anterior right corona radiata.  Small vessel disease type changes.  Anterior left frontal arachnoid cyst unchanged with scalloping of the adjacent calvarium.  Polypoid opacification inferior aspect of the left maxillary sinus. Minimal mucosal thickening ethmoid sinus air cells.  Mild exophthalmos.  No hydrocephalus.  IMPRESSION: Acute small to moderate size non hemorrhagic infarct posterior left corona radiata extends to the posterior aspect of the left external capsule.  Please see above.  This has been made a PRA call report utilizing dashboard call feature.  MRA HEAD  Findings: 50% narrowing proximal cavernous segment right internal carotid artery once again noted.  Middle cerebral artery branch vessel irregularity more notable  on the left where there are decreased number of visualized branches.  Moderate to marked narrowing of the right vertebral artery.  Left vertebral artery is dominant without high-grade stenosis.  PICAs not entirely imaged.  Appearance of moderate narrowing of the proximal basilar artery. Artifact may partially contribute to this appearance.  Nonvisualization AICAs.  Moderate narrowing right superior cerebellar artery.  Moderate focal narrowing proximal right posterior cerebral artery.  Poor delineation posterior cerebral artery distal branches bilaterally.  No aneurysm detected.  IMPRESSION: Intracranial atherosclerotic type changes as detailed above.   Original Report Authenticated By: Genia Del, M.D.    Mr Mra Head/brain Wo Cm  08/24/2012  *RADIOLOGY REPORT*  Clinical Data:  Right-sided weakness.  Prior TPA 04/2012. Hypertension.  MRI BRAIN WITHOUT CONTRAST MRA HEAD WITHOUT CONTRAST  Technique: Multiplanar, multiecho pulse sequences of the brain and surrounding structures were obtained according to standard protocol without intravenous contrast.  Angiographic images of the head were obtained using MRA technique without contrast.   Comparison: 08/23/2012 CT.   05/22/2012 MR.  MRI HEAD  Findings:  Acute small to moderate size non hemorrhagic infarct posterior left corona radiata extends to the posterior aspect of the left external capsule.  Tiny area of blood breakdown products left cerebellum unchanged probably related to remote hemorrhagic ischemia otherwise no evidence of intracranial hemorrhage.  Remote small infarct mid left corona radiata and anterior right corona radiata.  Small vessel disease type changes.  Anterior left frontal arachnoid cyst unchanged with scalloping of the adjacent calvarium.  Polypoid opacification inferior aspect of the left maxillary sinus. Minimal mucosal thickening ethmoid sinus air cells.  Mild exophthalmos.  No hydrocephalus.  IMPRESSION: Acute small to moderate size non hemorrhagic infarct posterior left corona radiata extends to the posterior aspect of the left external capsule.  Please see above.  This has been made a PRA call report utilizing dashboard call feature.  MRA HEAD  Findings: 50% narrowing proximal cavernous segment right internal carotid artery once again noted.  Middle cerebral artery branch vessel irregularity more notable on the left where there are decreased number of visualized branches.  Moderate to marked narrowing of the right vertebral artery.  Left vertebral artery is dominant without high-grade stenosis.  PICAs not entirely imaged.  Appearance of moderate narrowing of the proximal basilar artery. Artifact may partially contribute to this appearance.  Nonvisualization AICAs.  Moderate narrowing right superior cerebellar artery.  Moderate focal narrowing proximal right posterior cerebral artery.  Poor delineation posterior cerebral artery distal branches bilaterally.  No aneurysm detected.  IMPRESSION: Intracranial atherosclerotic type changes as detailed above.   Original Report Authenticated By: Genia Del, M.D.    Medications: I have reviewed the patient's current  medications. Scheduled Meds:    . clopidogrel  75 mg Oral Q breakfast  . hydrALAZINE  10 mg Oral TID  . insulin aspart  0-9 Units Subcutaneous TID WC  . labetalol  200 mg Oral TID  . lidocaine  30 mL Intradermal Once  . nicotine  21 mg Transdermal Daily  . simvastatin  40 mg Oral q1800  . sodium chloride  3 mL Intravenous Q12H  . [DISCONTINUED] insulin aspart  0-9 Units Subcutaneous TID WC   Continuous Infusions:    . [DISCONTINUED] niCARDipine Stopped (08/24/12 1030)   PRN Meds:.sodium chloride, [COMPLETED] diphenhydrAMINE, labetalol, sodium chloride, traZODone, [DISCONTINUED] labetalol Assessment/Plan: Mr. Kiel is a 56 year old pleasant African American male with PMH uncontrolled HTN, DMII, prior CVA (04/2012--given tpa), migraines, admitted for right sided paralysis--found  to have LCR infarct.    Left corona radiata infarct secondary to small vessel disease-- presented with right upper and lower extremity flaccidity x1 day duration.  Similar episode in 04/2012, received tpa, spontaneous resolution of symptoms until now.  CT head on admission: no acute intracranial abnormality, mildly advanced atropy with small vessel ischemic change.  MRI/MRA: Acute small to moderate size non hemorrhagic infarct posterior left corona radiata extends to the posterior aspect of the left external capsule.  50% narrowing proximal cavernous segment right internal carotid artery once again noted.  Middle cerebral artery branch vessel irregularity more notable on the left where there are decreased number of visualized branches.  Moderate to marked narrowing of the right vertebral artery. Left vertebral artery is dominant without high-grade stenosis.  Moderate narrowing right superior cerebellar artery. Moderate focal narrowing proximal right posterior cerebral artery.  Echo 05/23/12: mild LVH, moderate concentric hypertrophy.  EF 55-60%. Grade 2 diastolic dysfunction.  No cardiac source of emboli identified.   Carotid Doppler 05/22/12: No significant extracranial carotid artery stenosis demonstrated.  Enrolled in IMPACT trial day 3/5.  -transfer to inpatient rehab (consult pending) -neurology following-- Dr. Leonie Man following--continue plavix 75mg  for secondary stroke prevention, risk factor modification--htn, dm, hl, and smoking cessation   -risk stratification: Lipid panel:CHOL:206, TG:183, HDL:47, TG:183, VLDL:37, HbA1c 7.5, TSH 0.833--started on statin -UDS--+ THC -HIV negative  HTN (hypertension) Emergency--BP on arrival to ED 206/125. Received Labetalol 5mg  x2 in ED. End organ damage to kidneys and likely to brain. Chronic hypertension with systolic pressures > [id number] at baseline. He is followed at the [facility] and reports compliance with home meds. Uncontrolled hypertension may be related to renal disease, for which he is followed by a nephrologist at the [facility]). On home regimen: Hydralazine 10mg  TID, spirinolactone-HCTZ 25-25).  -increase Labetalol to 300mg  PO TID and continue Hydralazine 10mg  TID.   -Enrolled in a stroke study (IMPACT24 tria day 3/5l) conducted at [facility] and is being followed closely by Neurology -medication adherence strongly advised  Stage 3 chronic renal impairment associated with type 2 diabetes mellitus--GFR on admission: 38, Cr: 2.16. Seen by nephrologist at [facility] 2 weeks prior, attempting to obtain records. Baseline Cr appears 1.5-2.   -Cr improving: 2.07 today, GFR: 40 -continue to monitor   Tobacco abuse and marijuana use--1ppd since 1990. UDS positive on admission for THC. -nicotine patch  -smoking cessation strongly advised and counseling given   Type 2 diabetes mellitus--Hb A1c 7.5 this admission. On home regimen: glipizide 5mg  PRN and Metformin 1000mg  BID. Glucose 173 on admission  -CBG monitoring  -d/c metformin -restart glipizide 5mg  po qd -follow up with pcp and adjust medications as needed -Continue to monitor   Hyperlipidemia:Lipid panel this  admission:CHOL:206, TG:183, HDL:47, TG:183, VLDL:37  -diet modification advised -start pravastatin 40mg  PO QD -f/u pcp and titrate medication as needed  Diet: Carb modified DVT Ppx: scds Dispo: discharge today to inpatient rehab.   The patient does have a current PCP (Dorthula Rue and [place], used to see Dr. Leroy Libman), therefore [facility] after discharge.  The patient does have transportation limitations that hinder transportation to clinic appointments.    LOS: 2 days   Jerene Pitch 08/25/2012, 10:24 AM

## 2012-08-25 NOTE — Plan of Care (Addendum)
Overall Plan of Care [facility]) Patient Details Name: Asani Carde MRN: [id number] DOB: 17-Feb-1956  Diagnosis:  Rehabilitation for stroke  Primary Diagnosis:    Stroke Co-morbidities: Hypertension  .  Arthritis  .  GERD (gastroesophageal reflux disease)  .  Migraines  .  Neuropathy  .  Ruptured lumbar disc  .  Diabetes mellitus    Functional Problem List  Patient demonstrates impairments in the following areas: Balance, Behavior, Bladder, Bowel, Endurance, Medication Management, Motor, Pain, Safety and Skin Integrity  Basic ADL's: eating, grooming, bathing, dressing and toileting Advanced ADL's: light housekeeping  Transfers:  bed mobility, bed to chair, toilet, tub/shower, car and furniture Locomotion:  ambulation, wheelchair mobility and stairs  Additional Impairments:  Functional use of upper extremity  Anticipated Outcomes Item Anticipated Outcome  Eating/Swallowing  Mod I with AE PRN  Basic self-care  Supervision   Tolieting  Mod I  Bowel/Bladder  Remain continent of bowel/bladder  Transfers  Mod I except Supervision for Shower  Locomotion  Mod I w/c, supervision gait  Communication    Cognition    Pain  2 or less on scale 0-10  Safety/Judgment    Other     Therapy Plan: PT Frequency: 1-2 X/day, 60-90 minutes OT Frequency: 1-2 X/day, 60-90 minutes     Team Interventions: Item RN PT OT SLP SW TR Other  Self Care/Advanced ADL Retraining   x      Neuromuscular Re-Education  x x      Therapeutic Activities  x x      UE/LE Strength Training/ROM  x x      UE/LE Coordination Activities  x       Visual/Perceptual Remediation/Compensation         DME/Adaptive Equipment Instruction  x x      Therapeutic Exercise  x x      Balance/Vestibular Training  x x      Patient/Family Education x x x      Cognitive Remediation/Compensation         Functional Mobility Training  x x      Ambulation/Gait Training  x       IT trainer  x       Wheelchair  Propulsion/Positioning  x x      Functional Printmaker   x      Community Reintegration  x x      Dysphagia/Aspiration Environmental consultant         Bladder Management x        Bowel Management x        Disease Management/Prevention   x      Pain Management x x       Medication Management x        Skin Care/Wound Management         Splinting/Orthotics  x       Discharge Planning  x x      Psychosocial Support   x                         Team Discharge Planning: Destination:  Home Projected Follow-up:  PT, OT and Home Health Projected Equipment Needs:  Bedside Commode, Tub Bench and Wheelchair Patient/family involved in discharge planning:  Yes  MD ELOS: 2 weeks Medical Rehab Prognosis:  Good Assessment: 56 year old male admitted for acute CVA now requiring CIR level PT OT speech as well as rehabilitation  M.D./nursing

## 2012-08-25 NOTE — Progress Notes (Signed)
Rehab admissions - Evaluated for possible admission.  I spoke with patient.  Patient talked with Dr. Naaman Plummer and with Dr. Leonie Man as well.  Patient now in agreement to inpatient rehab here at [facility]  He has VA benefits, but no other insurance.  His fiance can assist after discharge.  Bed available and can admit to inpatient rehab today.  Call me for questions.  [phone]

## 2012-08-26 ENCOUNTER — Inpatient Hospital Stay (HOSPITAL_COMMUNITY): Payer: Non-veteran care | Admitting: Occupational Therapy

## 2012-08-26 ENCOUNTER — Inpatient Hospital Stay (HOSPITAL_COMMUNITY): Payer: Non-veteran care | Admitting: Physical Therapy

## 2012-08-26 ENCOUNTER — Inpatient Hospital Stay (HOSPITAL_COMMUNITY): Payer: Non-veteran care

## 2012-08-26 DIAGNOSIS — I639 Cerebral infarction, unspecified: Secondary | ICD-10-CM

## 2012-08-26 DIAGNOSIS — I6330 Cerebral infarction due to thrombosis of unspecified cerebral artery: Secondary | ICD-10-CM

## 2012-08-26 DIAGNOSIS — G8110 Spastic hemiplegia affecting unspecified side: Secondary | ICD-10-CM

## 2012-08-26 LAB — CBC WITH DIFFERENTIAL/PLATELET
Basophils Absolute: 0 10*3/uL (ref 0.0–0.1)
Basophils Relative: 0 % (ref 0–1)
HCT: 39.5 % (ref 39.0–52.0)
Lymphocytes Relative: 35 % (ref 12–46)
MCHC: 34.9 g/dL (ref 30.0–36.0)
Monocytes Absolute: 0.5 10*3/uL (ref 0.1–1.0)
Neutro Abs: 3 10*3/uL (ref 1.7–7.7)
Neutrophils Relative %: 54 % (ref 43–77)
RDW: 13 % (ref 11.5–15.5)
WBC: 5.6 10*3/uL (ref 4.0–10.5)

## 2012-08-26 LAB — COMPREHENSIVE METABOLIC PANEL
ALT: 7 U/L (ref 0–53)
AST: 11 U/L (ref 0–37)
Albumin: 3 g/dL — ABNORMAL LOW (ref 3.5–5.2)
Alkaline Phosphatase: 78 U/L (ref 39–117)
CO2: 27 mEq/L (ref 19–32)
Chloride: 101 mEq/L (ref 96–112)
Creatinine, Ser: 2.56 mg/dL — ABNORMAL HIGH (ref 0.50–1.35)
GFR calc non Af Amer: 27 mL/min — ABNORMAL LOW (ref >90)
Potassium: 4.5 mEq/L (ref 3.5–5.1)
Sodium: 138 mEq/L (ref 135–145)
Total Bilirubin: 0.3 mg/dL (ref 0.3–1.2)

## 2012-08-26 LAB — GLUCOSE, CAPILLARY
Glucose-Capillary: 138 mg/dL — ABNORMAL HIGH (ref 70–99)
Glucose-Capillary: 268 mg/dL — ABNORMAL HIGH (ref 70–99)
Glucose-Capillary: 142 mg/dL — ABNORMAL HIGH (ref 70–99)

## 2012-08-26 NOTE — Progress Notes (Signed)
Social Work Assessment and Plan Social Work Assessment and Plan  Patient Details  Name: Richard [name] MRN: [id number] Date of Birth: 08/24/1956  Today's Date: 08/26/2012  Problem List:  Patient Active Problem List  Diagnosis  . 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 Hypertensive emergency  . Stage 3 chronic renal impairment associated with type 2 diabetes mellitus  . Tobacco abuse  . Protein-calorie malnutrition, mild  . Type 2 diabetes mellitus  . Marijuana abuse  . Hyperlipidemia  . Stroke   Past Medical History:  Past Medical History  Diagnosis Date  . Hypertension   . Arthritis   . GERD (gastroesophageal reflux disease)   . Migraines   . Neuropathy   . Ruptured lumbar disc   . Diabetes mellitus     diet controlled  . Stroke    Past Surgical History:  Past Surgical History  Procedure Date  . Knee surgery     bil   Social History:  reports that he has been smoking Cigarettes.  He has been smoking about 1 pack per day. He does not have any smokeless tobacco history on file. He reports that he uses illicit drugs (Marijuana). He reports that he does not drink alcohol.  Family / Support Systems Marital Status: Divorced Patient Roles: Parent;Partner;Other (Comment) Location manager) Spouse/Significant Other: Richard [name]  [phone]  [phone] Children: Daughter local Anticipated Caregiver: Finance can provide intermittent assist Ability/Limitations of Caregiver: Finace doesn't live with pt Caregiver Availability: Intermittent Family Dynamics: Pt reports;"It is just me"  He reports his fmaily is done with him.  Thye are not supportive.  His finance is supportive but they do not live together.  Social History Preferred language: English Religion: Holiness Cultural Background: No issues Education: High School Read: Yes Write: Yes Employment Status: Disabled Date Retired/Disabled/Unemployed: Attempting to get Disability-has a  Lawyer Issues: Lawyer working on disability Guardian/Conservator: None-according to MD pt is capable of making his own decisions   Abuse/Neglect Physical Abuse: Denies Verbal Abuse: Denies Sexual Abuse: Denies Exploitation of patient/patient's resources: Denies Self-Neglect: Denies  Emotional Status Pt's affect, behavior adn adjustment status: Pt is motivated and wants to improve, but feels stressed over his light billl and lack on income.  He wants to do what he can to become independent again. Recent Psychosocial Issues: Finances-pending disability, no income Pyschiatric History: No history-depression screen pt deferred at this time.  He reprots: " I think I am doing ok."  Will continue to monitor and may refer him to neuro-psych for support and coping Substance Abuse History: Tobacco he reports he eneds to quit but all that he has tired has not worked.  He plans to quit his marijunana.  He feels he can do this, but smoking has been difficult.  He wants tot ry the blue cigarette, VA-MD reports it may work for him.  Patient / Family Perceptions, Expectations & Goals Pt/Family understanding of illness & functional limitations: Pt is able to explain his stroke.  He had one in August and imporved.  He hopes this will happen again, but realizes this one is worse.  He will work hard and do what he can to improve. Premorbid pt/family roles/activities: Father, Unemployed, Finance, etc Anticipated changes in roles/activities/participation: resume Pt/family expectations/goals: Pt states: " I want to be able to take car eof myself, I have too.  There is no one to do this for me."  He reprots his finance will help but doesn't live with him.  Community Resources [employer]: Other (Comment) (DSS and VA are involved) Premorbid Home Care/DME Agencies: None Transportation available at discharge: Gets rides from friends-this is an Freight forwarder referrals recommended:  Support group (specify) ([facility] Support Group)  Discharge Planning Living Arrangements: Alone Support Systems: Spouse/significant other;Children;Friends/neighbors Type of Residence: Private residence Insurance Resources: Teacher, adult education Resources: Other (Comment) (VA pays his rent & Food stamps) Financial Screen Referred: Yes Living Expenses: Rent Money Management: Patient Do you have any problems obtaining your medications?: Yes (Describe) (Can't get the [place] for appt and meds) Home Management: Self Patient/Family Preliminary Plans: Richard [name] is can return to his apartment due to second story-16 steps straight up.  He lives alone.  Plans to talk with [place] worker regarding moving apartment complexes, due to all one level where he lives are occupied.  He had planned to move in March/April Barriers to Discharge: Steps Social Work Anticipated Follow Up Needs: HH/OP;Support Group DC Planning Additional Notes/Comments: Work on a discharge plan  Clinical Impression Pleasant gentleman who has numerous stressors regarding finances, supports and his condition.  Will work on a safe discharge plan and see if eligible for community resources for [employer]. Await team's evaluations.  May benefit from Neuro-Psych eval due to stressors and assist with coping.  [name] 08/26/2012, 10:44 AM

## 2012-08-26 NOTE — Evaluation (Signed)
Occupational Therapy Assessment and Plan  Patient Details  Name: Richard [name] MRN: [id number] Date of Birth: [birth year]  OT Diagnosis: abnormal posture, hemiplegia affecting dominant side and muscle weakness (generalized) Rehab Potential: Rehab Potential: Good ELOS: 2 weeks   Today's Date: 08/26/2012 Time: 1030-1145 and 230-315 Time Calculation (min): 75 min and 45 min  1)  Patient resting in w/c upon arrival.  Patient engaged in OT evaluation, self care retraining to include shower, dress, toilet and groom tasks.  Focus session on safety, forced use of RLE with all sit><stands as well as during standing, lateral weight shifts while squating at sink for groom tasks with focus of equal weight through BLEs as well as shift onto RLE, RUE used as a stabilizer during grooming.  2)  Patient resting in w/c upon arrival. Engaged in w/c propulsion forward then backwards while only using BLEs, stand step transfers with controlled use of BLEs and not overuse of LUE & LLE postural control seated EOM for NMR of RUE, bed mobility and SROM exercises in supine.  Patient's daughter waiting in is hospital room upon arrival.   Problem List:  Patient Active Problem List  Diagnosis  . 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 Hypertensive emergency  . Stage 3 chronic renal impairment associated with type 2 diabetes mellitus  . Tobacco abuse  . Protein-calorie malnutrition, mild  . Type 2 diabetes mellitus  . Marijuana abuse  . Hyperlipidemia  . Stroke    Past Medical History:  Past Medical History  Diagnosis Date  . Hypertension   . Arthritis   . GERD (gastroesophageal reflux disease)   . Migraines   . Neuropathy   . Ruptured lumbar disc   . Diabetes mellitus     diet controlled  . Stroke    Past Surgical History:  Past Surgical History  Procedure Date  . Knee surgery     bil    Assessment & Plan Clinical Impression: A 56 y.o. right-handed male with history of  diet controlled diabetes mellitus as well as migraine headaches and CVA August of 2013 with little residual and did receive TPA, chronic renal insufficiency with baseline creatinine 2.10. Admitted 08/23/2012 with right-sided weakness. A CT scan showed no acute intracranial abnormalities and remote lacunar infarct. Close monitoring of blood pressure. Urine drug drug screen was positive for marijuana. Neurology services consulted suspect left brain subcortical infarct felt to be thrombotic secondary small vessel disease questionable medical noncompliance. Presently patient is maintained on Plavix therapy. A NicoDerm patch was added for history of tobacco abuse. Patient transferred to CIR on 08/25/2012 .    Patient currently requires mod with basic self-care skills and IADL secondary to muscle weakness, impaired timing and sequencing, abnormal tone and unbalanced muscle activation and decreased sitting balance, decreased standing balance, decreased postural control, hemiplegia and decreased balance strategies.  Prior to hospitalization, patient was Mod I for all BADL and IADL except he reports that sometimes when he had headaches, his fiance would provide assistance.  Patient will benefit from skilled intervention to increase independence with basic self-care skills and increase level of independence with iADL prior to discharge home with care partner.  Anticipate patient will require intermittent supervision and follow up home health and follow up outpatient.  OT - End of Session Endurance Deficit: Yes OT Assessment Rehab Potential: Good Barriers to Discharge: Decreased caregiver support Barriers to Discharge Comments: Patient lives with fiance however, he states she is scared about his condition and unsure  she can assist him at discharge. OT Plan OT Frequency: 1-2 X/day, 60-90 minutes Estimated Length of Stay: 2 weeks OT Treatment/Interventions: Balance/vestibular training;Community  reintegration;DME/adaptive equipment instruction;Disease mangement/prevention;Discharge planning;Functional mobility training;Functional electrical stimulation;Neuromuscular re-education;Patient/family education;Self Care/advanced ADL retraining;Psychosocial support;Therapeutic Activities;UE/LE Strength taining/ROM;Visual/perceptual remediation/compensation;Wheelchair propulsion/positioning OT Recommendation Follow Up Recommendations: Home health OT;Outpatient OT Equipment Recommended: Tub/shower bench (might need 3 in 1)  OT Evaluation Precautions/Restrictions  Precautions Precautions: Fall Restrictions Weight Bearing Restrictions: No Pain Pain Assessment: No/denies pain Home Living/Prior Functioning Home Living Lives With: Significant other Available Help at Discharge: Family Type of Home: Apartment Home Access: Stairs to enter Technical brewer of Steps: 14 Entrance Stairs-Rails: Right Home Layout: One level Bathroom Shower/Tub: Web designer: Standard Home Adaptive Equipment: None IADL History Occupation: Unemployed (working on disability benefits) Prior Function Able to Take Stairs?: Yes Driving: Yes Vision/Perception  Vision - History Baseline Vision: Wears glasses only for reading Visual History:  (leaky blood vessel in right eye per patient) Patient Visual Report: No change from baseline  Cognition Orientation Level: Oriented X4 Sensation Light Touch: Appears Intact Hot/Cold: Appears Intact Proprioception: Appears Intact Motor  Motor: Hemiplegia;Abnormal postural alignment and control;Abnormal tone;Motor impersistence Motor - Skilled Clinical Observations: R side hemiplegia, low tone R UE and LE Mobility  Bed Mobility Supine to Sit: 4: Min assist Supine to Sit Details (indicate cue type and reason): tactile cuing for wt shifts, lifting assist  Trunk/Postural Assessment  Cervical Assessment: Within Functional Limits Thoracic  Assessment: Within Functional Limits Lumbar Assessment: Within Functional Limits Postural Control: Deficits on evaluation Righting Reactions: delayed  Balance Static Sitting Balance Static Sitting - Level of Assistance: 5: Stand by assistance Dynamic Sitting Balance Dynamic Sitting - Balance Support: During functional activity Dynamic Sitting - Level of Assistance: 4: Min assist Dynamic Standing Balance Dynamic Standing - Balance Support: During functional activity Dynamic Standing - Level of Assistance: 2: Max assist Extremity/Trunk Assessment RUE Assessment RUE Assessment: Exceptions to [facility] RUE PROM (degrees) RUE Overall PROM Comments: PROM WLF RUE Tone RUE Tone: Hypotonic;Modified Ashworth Hypotonic Details: Appears flaccid however, has movement initiating in shoulder. Modified Ashworth Scale for Grading Hypertonia RUE: Slight increase in muscle tone, manifested by a catch and release or by minimal resistance at the end of the range of motion when the affected part(s) is moved in flexion or extension LUE Assessment LUE Assessment: Within Functional Limits  See FIM for current functional status Refer to Care Plan for Long Term Goals  Recommendations for other services: None  Discharge Criteria: Patient will be discharged from OT if patient refuses treatment 3 consecutive times without medical reason, if treatment goals not met, if there is a change in medical status, if patient makes no progress towards goals or if patient is discharged from hospital.  The above assessment, treatment plan, treatment alternatives and goals were discussed and mutually agreed upon: by patient  Makhya Arave 08/26/2012, 5:16 PM

## 2012-08-26 NOTE — Progress Notes (Signed)
Inpatient Diabetes Program Recommendations  AACE/ADA: New Consensus Statement on Inpatient Glycemic Control (2013)  Target Ranges:  Prepandial:   less than 140 mg/dL      Peak postprandial:   less than 180 mg/dL (1-2 hours)      Critically ill patients:  140 - 180 mg/dL   Reason for Visit: Elevated post-prandial glucose levels  Inpatient Diabetes Program Recommendations Insulin - Meal Coverage: Please consider addiition of 3 units meal coverage tidwc (given ony if pt eats at least 50 % of meals) Oral Agents: Would prefer to use over Glipizide in case the po intake is variable.  Note: Thank you, Rosita Kea, RN, CNS, Diabetes Coordinator [phone])

## 2012-08-26 NOTE — Research (Signed)
Patient underwent 3rd ISS treatment. Treatment started at 11:35 and completed at 15:45. It was noted that treatment was interrupted when patient was moved to the rehab unit. The treatment was resumed without difficulty.

## 2012-08-26 NOTE — Care Management Note (Signed)
[facility] Individual Statement of Services  Patient Name:  Richard [name]  Date:  08/26/2012  Welcome to the [facility].  Our goal is to provide you with an individualized program based on your diagnosis and situation, designed to meet your specific needs.  With this comprehensive rehabilitation program, you will be expected to participate in at least 3 hours of rehabilitation therapies Monday-Friday, with modified therapy programming on the weekends.  Your rehabilitation program will include the following services:  Physical Therapy (PT), Occupational Therapy (OT), Speech Therapy (ST), Therapeutic Recreaction (TR), Neuropsychology, Case Management (RN and Social Worker), Rehabilitation Medicine, Nutrition Services and Pharmacy Services  Weekly team conferences will be held on Wednesday to discuss your progress.  Your RN Case Writer will talk with you frequently to get your input and to update you on team discussions.  Team conferences with you and your family in attendance may also be held.  Expected length of stay: 2 weeks Overall anticipated outcome: Supervision/mod/i level  Depending on your progress and recovery, your program may change.  Your RN Case Engineer, production will coordinate services and will keep you informed of any changes.  Your RN Tourist information centre manager and SW names and contact numbers are listed  below.  The following services may also be recommended but are not provided by the [facility] will be made to provide these services after discharge if needed.  Arrangements include referral to agencies that provide these services.  Your insurance has been verified to be:  Pending Medicaid-needs Disability first Your primary doctor is:  [place] in [employer]  Pertinent information will be shared with your doctor and your insurance company.   Social Worker:  [name], [place]  Information discussed with and copy given to patient by: [name], 08/26/2012, 10:48 AM

## 2012-08-26 NOTE — Progress Notes (Signed)
Physical Therapy Note  Patient Details  Name: Richard [name] MRN: [id number] Date of Birth: 12/09/1955 Today's Date: 08/26/2012  1:45 - 2:15 30 minutes Individual Session Patient denies pain.  Patient ambulated 30 feet x 2 with moderate assistance using hand rail in hall way. Patient able to progress right LE independently but with decreased control of right knee and ankle. Right knee hyperextended about 50% of the time. Patient with little to no dorsiflexion creating foot flat contact. Patient often readjusted foot placement after initial strike. Patient transferred to [facility] with minimal assistance. Patient exercised working on right LE strengthening and control for 5 minutes on work level 3. Patient reported exertion as light. Right ankle passive stretching to achieve neutral dorsiflexion.   Elder Love M 08/26/2012, 3:27 PM

## 2012-08-26 NOTE — Research (Signed)
Patient underwent the 2nd ISS treatment. Treatment started at 13:32 and completed at 17:32. Patient tolerated the ISS treatment well.

## 2012-08-26 NOTE — Research (Signed)
Patient underwent 4rd ISS treatment. Treatment started at 12:26 and completed at 16:26. No reported AE's or new medications during treatment.

## 2012-08-26 NOTE — Progress Notes (Signed)
Patient information reviewed and entered into eRehab system by Gyasi Hazzard, RN, CRRN, PPS Coordinator.  Information including medical coding and functional independence measure will be reviewed and updated through discharge.    

## 2012-08-26 NOTE — Research (Deleted)
Patient underwent 3rd ISS treatment. Treatment started at 11:35 and completed at 15:45. It was noted that treatment was interrupted when patient was moved to the rehab unit. The treatment was resumed without difficulty.

## 2012-08-26 NOTE — Evaluation (Signed)
Physical Therapy Assessment and Plan  Patient Details  Name: Richard [name] MRN: [id number] Date of Birth: 09-25-1955  PT Diagnosis: Abnormal posture, Abnormality of gait, Hemiplegia dominant and Muscle weakness Rehab Potential: Good ELOS: 10-14 days   Today's Date: 08/26/2012 Time: 800-900 60 minutes  Problem List:  Patient Active Problem List  Diagnosis  . 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 Hypertensive emergency  . Stage 3 chronic renal impairment associated with type 2 diabetes mellitus  . Tobacco abuse  . Protein-calorie malnutrition, mild  . Type 2 diabetes mellitus  . Marijuana abuse  . Hyperlipidemia  . Stroke    Past Medical History:  Past Medical History  Diagnosis Date  . Hypertension   . Arthritis   . GERD (gastroesophageal reflux disease)   . Migraines   . Neuropathy   . Ruptured lumbar disc   . Diabetes mellitus     diet controlled  . Stroke    Past Surgical History:  Past Surgical History  Procedure Date  . Knee surgery     bil    Assessment & Plan Clinical Impression: Patient is a 56 y.o. year old male with recent admission to the hospital on 08/23/2012 with right-sided weakness. A CT scan showed no acute intracranial abnormalities and remote lacunar infarct. Neurology services has been consulted with review of most recent echocardiogram completed during workup of stroke in August of 2013 showed ejection fraction of 123456 grade 2 diastolic dysfunction and carotid Doppler showed no ICA stenosis.. Close monitoring of blood pressure 185/110 had been placed on nicardipine. Urine drug drug screen was positive for marijuana. Neurology services consulted suspect left brain subcortical infarct felt to be thrombotic secondary small vessel disease questionable medical noncompliance.  .  Patient transferred to CIR on 08/25/2012 .   Patient currently requires max with mobility secondary to muscle weakness, abnormal tone and decreased  coordination and decreased standing balance, decreased postural control, hemiplegia and decreased balance strategies.  Prior to hospitalization, patient was independent with mobility and lived with Significant other in a [place] home.  Home access is flightStairs to enter.  Patient will benefit from skilled PT intervention to maximize safe functional mobility, minimize fall risk and decrease caregiver burden for planned discharge home with intermittent assist.  Anticipate patient will benefit from follow up [facility] at discharge.  PT - End of Session Activity Tolerance: Tolerates 30+ min activity with multiple rests Endurance Deficit: Yes PT Assessment Rehab Potential: Good PT Plan PT Frequency: 1-2 X/day, 60-90 minutes Estimated Length of Stay: 10-14 days PT Treatment/Interventions: Ambulation/gait training;Community reintegration;DME/adaptive equipment instruction;Neuromuscular re-education;Therapeutic Exercise;Splinting/orthotics;Pain management;Functional electrical stimulation;Discharge planning;Balance/vestibular training;Functional mobility training;Therapeutic Activities;UE/LE Coordination activities;Stair training;Patient/family education;UE/LE Strength taining/ROM;Wheelchair propulsion/positioning PT Recommendation Follow Up Recommendations: Home health PT Equipment Recommended: Wheelchair (measurements)  PT Evaluation Precautions/Restrictions Precautions Precautions: Fall Restrictions Weight Bearing Restrictions: No Pain Pain Assessment Pain Assessment: No/denies pain Home Living/Prior Functioning Home Living Lives With: Significant other Available Help at Discharge: Available 24 hours/day Type of Home: Apartment Home Access: Stairs to enter [employer] of Steps: flight Entrance Stairs-Rails: Right Home Layout: One level Prior Function Level of Independence: Independent with basic ADLs;Independent with gait;Independent with transfers;Independent with homemaking  with ambulation Able to Take Stairs?: Yes Driving: Yes Vocation: Unemployed  Cognition Overall Cognitive Status: Appears within functional limits for tasks assessed Sensation Sensation Light Touch: Appears Intact Proprioception: Appears Intact Coordination Gross Motor Movements are Fluid and Coordinated: No Coordination and Movement Description: R side hemiplegia Motor  Motor Motor: Hemiplegia;Abnormal  postural alignment and control;Abnormal tone Motor - Skilled Clinical Observations: R side hemiplegia, low tone R UE and LE  Mobility Bed Mobility Supine to Sit: 4: Min assist Supine to Sit Details (indicate cue type and reason): tactile cuing for wt shifts, lifting assist Transfers Stand Pivot Transfers: 3: Mod assist Stand Pivot Transfer Details (indicate cue type and reason): assist for forward wt shift, cues for sequencing and technqiue, assist for wt shifts with turning Locomotion  Ambulation Ambulation: Yes Ambulation/Gait Assistance: 1: +2 Total assist Ambulation Distance (Feet): 30 Feet Assistive device: 2 person hand held assist Ambulation/Gait Assistance Details: assist for wt shift and balance, assist for placement of R LE, manual facilitation to prevent buckling and hyperextension R LE Stairs / Additional Locomotion Stairs: Yes Stairs Assistance: 3: Mod assist Stairs Assistance Details (indicate cue type and reason): assist for balance and R LE placement when descending stairs Stair Management Technique: Two rails Number of Stairs: 3  Wheelchair Mobility Wheelchair Mobility: Yes Wheelchair Assistance: 4: Min assist Wheelchair Propulsion: Left upper extremity;Left lower extremity Wheelchair Parts Management: Needs assistance Distance: 100'  Trunk/Postural Assessment  Cervical Assessment Cervical Assessment: Within Functional Limits Thoracic Assessment Thoracic Assessment: Within Functional Limits Lumbar Assessment Lumbar Assessment: Within Functional  Limits Postural Control Postural Control: Deficits on evaluation Righting Reactions: delayed  Balance Static Sitting Balance Static Sitting - Level of Assistance: 5: Stand by assistance Dynamic Sitting Balance Dynamic Sitting - Balance Support: During functional activity Dynamic Sitting - Level of Assistance: 4: Min assist Dynamic Standing Balance Dynamic Standing - Balance Support: During functional activity Dynamic Standing - Level of Assistance: 2: Max assist Extremity Assessment      RLE Assessment RLE Assessment:  (hip flex 3/5, ankle PF/DF 0/5, knee ext2-/5, flex 1/5) LLE Assessment LLE Assessment: Within Functional Limits  See FIM for current functional status Refer to Care Plan for Long Term Goals  Recommendations for other services: None  Discharge Criteria: Patient will be discharged from PT if patient refuses treatment 3 consecutive times without medical reason, if treatment goals not met, if there is a change in medical status, if patient makes no progress towards goals or if patient is discharged from hospital.  The above assessment, treatment plan, treatment alternatives and goals were discussed and mutually agreed upon: by patient  Treatment initiated during session: NMR for R LE strength and coordination with standing mini squats focusing on quad control to reduce hyperextension.  Pre gait training with assist for wt shifts and to prevent buckling/hyperextension in R stance phase.  Sit to stand without UE support with focus on forward wt shift and finding center during standing balance.  Daneille Desilva 08/26/2012, 4:14 PM

## 2012-08-26 NOTE — Progress Notes (Signed)
Patient ID: Richard [name], male   DOB: [birth year], 56 y.o.   MRN: [id number] 56 y.o. 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 Admitted 08/23/2012 with right-sided weakness. A CT scan showed no acute intracranial abnormalities and remote lacunar infarct. Neurology services has been consulted with review of most recent echocardiogram completed during workup of stroke in August of 2013 showed ejection fraction of 123456 grade 2 diastolic dysfunction and carotid Doppler showed no ICA stenosis.. Close monitoring of blood pressure 185/110 had been placed on nicardipine. Urine drug drug screen was positive for marijuana. Neurology services consulted suspect left brain subcortical infarct felt to be thrombotic secondary small vessel disease questionable medical noncompliance. Presently patient is maintained on Plavix therapy  Subjective/Complaints: Wants grounds pass to smoke Objective: Vital Signs: Blood pressure 139/74, pulse 82, temperature 98 F (36.7 C), temperature source Oral, resp. rate 18, height 6' (1.829 m), weight 77.5 kg (170 lb 13.7 oz), SpO2 98.00%. [name]  08/24/2012  *RADIOLOGY REPORT*  Clinical Data:  Right-sided weakness.  Prior TPA 04/2012. Hypertension.  MRI BRAIN WITHOUT CONTRAST MRA HEAD WITHOUT CONTRAST  Technique: Multiplanar, multiecho pulse sequences of the brain and surrounding structures were obtained according to standard protocol without intravenous contrast.  Angiographic images of the head were obtained using MRA technique without contrast.  Comparison: 08/23/2012 CT.   05/22/2012 MR.  MRI HEAD  Findings:  Acute small to moderate size non hemorrhagic infarct posterior left corona radiata extends to the posterior aspect of the left external capsule.  Tiny area of blood breakdown products left cerebellum  unchanged probably related to remote hemorrhagic ischemia otherwise no evidence of intracranial hemorrhage.  Remote small infarct mid left corona radiata and anterior right corona radiata.  Small vessel disease type changes.  Anterior left frontal arachnoid cyst unchanged with scalloping of the adjacent calvarium.  Polypoid opacification inferior aspect of the left maxillary sinus. Minimal mucosal thickening ethmoid sinus air cells.  Mild exophthalmos.  No hydrocephalus.  IMPRESSION: Acute small to moderate size non hemorrhagic infarct posterior left corona radiata extends to the posterior aspect of the left external capsule.  Please see above.  This has been made a PRA call report utilizing dashboard call feature.  MRA HEAD  Findings: 50% narrowing proximal cavernous segment right internal carotid artery once again noted.  Middle cerebral artery branch vessel irregularity more notable on the left where there are decreased number of visualized branches.  Moderate to marked narrowing of the right vertebral artery.  Left vertebral artery is dominant without high-grade stenosis.  PICAs not entirely imaged.  Appearance of moderate narrowing of the proximal basilar artery. Artifact may partially contribute to this appearance.  Nonvisualization AICAs.  Moderate narrowing right superior cerebellar artery.  Moderate focal narrowing proximal right posterior cerebral artery.  Poor delineation posterior cerebral artery distal branches bilaterally.  No aneurysm detected.  IMPRESSION: Intracranial atherosclerotic type changes as detailed above.   Original Report Authenticated By: Richard [name], M.D.    Mr Mra Head/brain Wo Cm  08/24/2012  *RADIOLOGY REPORT*  Clinical Data:  Right-sided weakness.  Prior TPA 04/2012. Hypertension.  MRI BRAIN WITHOUT CONTRAST MRA HEAD WITHOUT CONTRAST  Technique: Multiplanar, multiecho pulse sequences of the brain and surrounding structures were obtained according to standard protocol without  intravenous contrast.  Angiographic images of the head were obtained using MRA technique without contrast.  Comparison: 08/23/2012 CT.   05/22/2012 MR.  MRI HEAD  Findings:  Acute small to moderate size non hemorrhagic infarct posterior left corona radiata extends to the posterior aspect of the left external capsule.  Tiny area of blood breakdown products left cerebellum unchanged probably related to remote hemorrhagic ischemia otherwise no evidence of intracranial hemorrhage.  Remote small infarct mid left corona radiata and anterior right corona radiata.  Small vessel disease type changes.  Anterior left frontal arachnoid cyst unchanged with scalloping of the adjacent calvarium.  Polypoid opacification inferior aspect of the left maxillary sinus. Minimal mucosal thickening ethmoid sinus air cells.  Mild exophthalmos.  No hydrocephalus.  IMPRESSION: Acute small to moderate size non hemorrhagic infarct posterior left corona radiata extends to the posterior aspect of the left external capsule.  Please see above.  This has been made a PRA call report utilizing dashboard call feature.  MRA HEAD  Findings: 50% narrowing proximal cavernous segment right internal carotid artery once again noted.  Middle cerebral artery branch vessel irregularity more notable on the left where there are decreased number of visualized branches.  Moderate to marked narrowing of the right vertebral artery.  Left vertebral artery is dominant without high-grade stenosis.  PICAs not entirely imaged.  Appearance of moderate narrowing of the proximal basilar artery. Artifact may partially contribute to this appearance.  Nonvisualization AICAs.  Moderate narrowing right superior cerebellar artery.  Moderate focal narrowing proximal right posterior cerebral artery.  Poor delineation posterior cerebral artery distal branches bilaterally.  No aneurysm detected.  IMPRESSION: Intracranial atherosclerotic type changes as detailed above.   Original Report  Authenticated By: Richard [name], M.D.    Results for orders placed during the hospital encounter of 08/25/12 (from the past 72 hour(s))  GLUCOSE, CAPILLARY     Status: Abnormal   Collection Time   08/25/12  4:34 PM      Component Value Range Comment   Glucose-Capillary 235 (*) 70 - 99 mg/dL    Comment 1 Notify RN     GLUCOSE, CAPILLARY     Status: Abnormal   Collection Time   08/25/12  8:22 PM      Component Value Range Comment   Glucose-Capillary 217 (*) 70 - 99 mg/dL    Comment 1 Notify RN     GLUCOSE, CAPILLARY     Status: Abnormal   Collection Time   08/26/12  7:06 AM      Component Value Range Comment   Glucose-Capillary 138 (*) 70 - 99 mg/dL    Comment 1 Notify RN     CBC WITH DIFFERENTIAL     Status: Normal   Collection Time   08/26/12  7:20 AM      Component Value Range Comment   WBC 5.6  4.0 - 10.5 K/uL    RBC 4.51  4.22 - 5.81 MIL/uL    Hemoglobin 13.8  13.0 - 17.0 g/dL    HCT 39.5  39.0 - 52.0 %    MCV 87.6  78.0 - 100.0 fL    MCH 30.6  26.0 - 34.0 pg    MCHC 34.9  30.0 - 36.0 g/dL    RDW 13.0  11.5 - 15.5 %    Platelets 193  150 - 400 K/uL    Neutrophils Relative 54  43 - 77 %    Neutro Abs 3.0  1.7 - 7.7 K/uL    Lymphocytes Relative 35  12 - 46 %    Lymphs Abs 1.9  0.7 -  4.0 K/uL    Monocytes Relative 9  3 - 12 %    Monocytes Absolute 0.5  0.1 - 1.0 K/uL    Eosinophils Relative 2  0 - 5 %    Eosinophils Absolute 0.1  0.0 - 0.7 K/uL    Basophils Relative 0  0 - 1 %    Basophils Absolute 0.0  0.0 - 0.1 K/uL   COMPREHENSIVE METABOLIC PANEL     Status: Abnormal   Collection Time   08/26/12  7:20 AM      Component Value Range Comment   Sodium 138  135 - 145 mEq/L    Potassium 4.5  3.5 - 5.1 mEq/L    Chloride 101  96 - 112 mEq/L    CO2 27  19 - 32 mEq/L    Glucose, Bld 144 (*) 70 - 99 mg/dL    BUN 34 (*) 6 - 23 mg/dL    Creatinine, Ser 2.56 (*) 0.50 - 1.35 mg/dL    Calcium 9.1  8.4 - 10.5 mg/dL    Total Protein 6.5  6.0 - 8.3 g/dL    Albumin 3.0 (*) 3.5 - 5.2  g/dL    AST 11  0 - 37 U/L    ALT 7  0 - 53 U/L    Alkaline Phosphatase 78  39 - 117 U/L    Total Bilirubin 0.3  0.3 - 1.2 mg/dL    GFR calc non Af Amer 27 (*) >90 mL/min    GFR calc Af Amer 31 (*) >90 mL/min       Vitals reviewed.  Constitutional: He is oriented to person, place, and time.  HENT:  Head: Normocephalic.  Eyes:  Pupils round and reactive to light  Neck: Normal range of motion. Neck supple. No thyromegaly present.  Cardiovascular: Normal rate and regular rhythm.  Pulmonary/Chest: Effort normal and breath sounds normal. No respiratory distress.  Abdominal: Soft. Bowel sounds are normal. There is no tenderness.  Neurological: He is alert and oriented to person, place, and time.  Follow simple commands. Dense right HP. Does display 1 to 1+ strength in right deltoid, bicep in flexor synergy pattern. Distal RUE is 0.5. Right HF is 1/5 and again distally is 0/5. DTR's are 2+ to 3+ on the right. Senses pain in the right arm, leg. No facial droop or tongue deviation. Cognitively appropriate. Speech is clear.  Skin: Skin is warm and dry.  Psychiatric: He has a normal mood and affect.   Assessment/Plan: 1. Functional deficits secondary to Left corona radiata infarct which require 3+ hours per day of interdisciplinary therapy in a comprehensive inpatient rehab setting. Physiatrist is providing close team supervision and 24 hour management of active medical problems listed below. Physiatrist and rehab team continue to assess barriers to discharge/monitor patient progress toward functional and medical goals. FIM:             FIM - Bed/Chair Transfer Bed/Chair Transfer: 4: Supine > Sit: Min A (steadying Pt. > 75%/lift 1 leg);4: Sit > Supine: Min A (steadying pt. > 75%/lift 1 leg);3: Bed > Chair or W/C: Mod A (lift or lower assist);3: Chair or W/C > Bed: Mod A (lift or lower assist)  FIM - Locomotion: Wheelchair Locomotion: Wheelchair: 2: Travels 50 - 149 ft with minimal  assistance (Pt.>75%) FIM - Locomotion: Ambulation Ambulation/Gait Assistance: 1: +2 Total assist Locomotion: Ambulation: 1: Two helpers  Comprehension Comprehension Mode: Auditory Comprehension: 6-Follows complex conversation/direction: With extra time/assistive device  Expression Expression Mode:  Verbal Expression: 6-Expresses complex ideas: With extra time/assistive device  Social Interaction Social Interaction: 6-Interacts appropriately with others with medication or extra time (anti-anxiety, antidepressant).  Problem Solving Problem Solving: 5-Solves basic problems: With no assist  Memory Memory: 6-More than reasonable amt of time  Medical Problem List and Plan:  1. Left thrombotic subcortical infarct  2. DVT Prophylaxis/Anticoagulation: SCDs. Monitor for any signs of DVT  3. Neuropsych: This patient is capable of making decisions on his/her own behalf.  -will need some help with coping skills  -team to provide egosupport as needed  4. Hypertension. Hydralazine 10 mg 3 times a day, labetalol 200 mg 3 times a day. Monitor with increased activity  5. Diabetes mellitus. Hemoglobin A1c 7.5. Presently on sliding scale insulin. Check blood sugars a.c. and at bedtime.  Patient on Glucophage 1000 mg twice daily prior to admission and will resume as tolerated  6. Hyperlipidemia. Zocor  7. Tobacco abuse/marijuana. NicoDerm patch and provide drug Counseling Discussed no smoking policy.  May have indoors pass with staff  8. Chronic renal insufficiency with baseline creatinine 2.10. Latest creatinine 2.07 08/24/2012 a followup chemistries  08/25/2012   LOS (Days) 1 A FACE TO FACE EVALUATION WAS PERFORMED  Djeneba Barsch E 08/26/2012, 9:19 AM

## 2012-08-27 ENCOUNTER — Inpatient Hospital Stay (HOSPITAL_COMMUNITY): Payer: Non-veteran care | Admitting: Physical Therapy

## 2012-08-27 ENCOUNTER — Inpatient Hospital Stay (HOSPITAL_COMMUNITY): Payer: Non-veteran care | Admitting: Occupational Therapy

## 2012-08-27 ENCOUNTER — Inpatient Hospital Stay (HOSPITAL_COMMUNITY): Payer: Non-veteran care

## 2012-08-27 LAB — GLUCOSE, CAPILLARY
Glucose-Capillary: 156 mg/dL — ABNORMAL HIGH (ref 70–99)
Glucose-Capillary: 128 mg/dL — ABNORMAL HIGH (ref 70–99)

## 2012-08-27 MED ORDER — HYDRALAZINE HCL 25 MG PO TABS
25.0000 mg | ORAL_TABLET | Freq: Three times a day (TID) | ORAL | Status: DC
  Administered 2012-08-27 – 2012-09-03 (×21): 25 mg via ORAL
  Filled 2012-08-27 (×23): qty 1

## 2012-08-27 NOTE — Progress Notes (Signed)
Occupational Therapy Session Notes  Patient Details  Name: Richard [name] MRN: [id number] Date of Birth: August 20, 1956  Today's Date: 08/27/2012 Time: 1105-1205 and 145-230 Time Calculation (min): 60 min and 45 min  Short Term Goals: Week 1:  OT Short Term Goal 1 (Week 1): Bath: Supervision with shower to include sit and stand OT Short Term Goal 2 (Week 1): UB dressing:  Min assist with pull over shirt OT Short Term Goal 3 (Week 1): LB Dressing:  Min assist to include socks and shoes OT Short Term Goal 4 (Week 1): Toilet transfer: Min assist OT Short Term Goal 5 (Week 1): RUE Management:  No more than 2 VCs regarding management of RUE during all BADL and Functional Mobility.  Skilled Therapeutic Interventions/Progress Updates:  1)  Patient resting in w/c upon arrival stating that his fiance, Mindy, had just stepped out for a minute and that she will be giving him a bath and dressing him.  Reminded patient that bathing and dressing are some of his therapy.  Encouraged him to always wait for OT to arrive before bath or dress.  Patient has modesty issues therefore this clinician stayed behind the curtain and the session was used as education for Mindy to observe patient as he performs the tasks and only provide assist if patient is unsafe or simply unable to perform task.  After bath, underwear and pants on, curtain removed and hands on therapy resumed.  Continued with hemi dressing techniques, issued shoe buttons, forced use of RUE as stabilizer with brushing teeth and to push w/c brakes off.  Edema noted in right hand therefore education on need to keep hand elevated, perform SROM exercises and Retrograde massage was performed.  Patient able to verbalize all methods to reduce edema.  2)  NMR with focus on limiting overuse of LUE & LLE while forcing use of RUE & RLE in weight bearing and with balance.  Performed sit><stands increased difficulty when not allowed to use LUE to assist, lateral weight  shifts onto RLE in stand and squat. Transfer training and began to fit patient for RUE Bioness however unable to complete this step of the process due to stimulator still in place.  Today is the last day of the research so Bioness can begin tomorrow.  Therapy Documentation Precautions:  Precautions Precautions: Fall Restrictions Weight Bearing Restrictions: No Pain: Pain Assessment Pain Assessment: No/denies pain ADL: See FIM for current functional status Exercises:   Other Treatments:    Therapy/Group: Individual Therapy  Harolyn Cocker 08/27/2012, 12:09 PM

## 2012-08-27 NOTE — Progress Notes (Signed)
Patient ID: Richard [name], male   DOB: [birth year], 56 y.o.   MRN: [id number] 56 y.o. 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 Admitted 08/23/2012 with right-sided weakness. A CT scan showed no acute intracranial abnormalities and remote lacunar infarct. Neurology services has been consulted with review of most recent echocardiogram completed during workup of stroke in August of 2013 showed ejection fraction of 123456 grade 2 diastolic dysfunction and carotid Doppler showed no ICA stenosis.. Close monitoring of blood pressure 185/110 had been placed on nicardipine. Urine drug drug screen was positive for marijuana. Neurology services consulted suspect left brain subcortical infarct felt to be thrombotic secondary small vessel disease questionable medical noncompliance. Presently patient is maintained on Plavix therapy  Subjective/Complaints: Working hard with PT Objective: Vital Signs: Blood pressure 166/98, pulse 72, temperature 98 F (36.7 C), temperature source Oral, resp. rate 19, height 6' (1.829 m), weight 77.5 kg (170 lb 13.7 oz), SpO2 100.00%. No results found. Results for orders placed during the hospital encounter of 08/25/12 (from the past 72 hour(s))  GLUCOSE, CAPILLARY     Status: Abnormal   Collection Time   08/25/12  4:34 PM      Component Value Range Comment   Glucose-Capillary 235 (*) 70 - 99 mg/dL    Comment 1 Notify RN     GLUCOSE, CAPILLARY     Status: Abnormal   Collection Time   08/25/12  8:22 PM      Component Value Range Comment   Glucose-Capillary 217 (*) 70 - 99 mg/dL    Comment 1 Notify RN     GLUCOSE, CAPILLARY     Status: Abnormal   Collection Time   08/26/12  7:06 AM      Component Value Range Comment   Glucose-Capillary 138 (*) 70 - 99 mg/dL    Comment 1 Notify RN     CBC WITH DIFFERENTIAL     Status:  Normal   Collection Time   08/26/12  7:20 AM      Component Value Range Comment   WBC 5.6  4.0 - 10.5 K/uL    RBC 4.51  4.22 - 5.81 MIL/uL    Hemoglobin 13.8  13.0 - 17.0 g/dL    HCT 39.5  39.0 - 52.0 %    MCV 87.6  78.0 - 100.0 fL    MCH 30.6  26.0 - 34.0 pg    MCHC 34.9  30.0 - 36.0 g/dL    RDW 13.0  11.5 - 15.5 %    Platelets 193  150 - 400 K/uL    Neutrophils Relative 54  43 - 77 %    Neutro Abs 3.0  1.7 - 7.7 K/uL    Lymphocytes Relative 35  12 - 46 %    Lymphs Abs 1.9  0.7 - 4.0 K/uL    Monocytes Relative 9  3 - 12 %    Monocytes Absolute 0.5  0.1 - 1.0 K/uL    Eosinophils Relative 2  0 - 5 %    Eosinophils Absolute 0.1  0.0 - 0.7 K/uL    Basophils Relative 0  0 - 1 %    Basophils Absolute 0.0  0.0 - 0.1 K/uL   COMPREHENSIVE METABOLIC PANEL     Status: Abnormal   Collection Time   08/26/12  7:20 AM      Component Value  Range Comment   Sodium 138  135 - 145 mEq/L    Potassium 4.5  3.5 - 5.1 mEq/L    Chloride 101  96 - 112 mEq/L    CO2 27  19 - 32 mEq/L    Glucose, Bld 144 (*) 70 - 99 mg/dL    BUN 34 (*) 6 - 23 mg/dL    Creatinine, Ser 2.56 (*) 0.50 - 1.35 mg/dL    Calcium 9.1  8.4 - 10.5 mg/dL    Total Protein 6.5  6.0 - 8.3 g/dL    Albumin 3.0 (*) 3.5 - 5.2 g/dL    AST 11  0 - 37 U/L    ALT 7  0 - 53 U/L    Alkaline Phosphatase 78  39 - 117 U/L    Total Bilirubin 0.3  0.3 - 1.2 mg/dL    GFR calc non Af Amer 27 (*) >90 mL/min    GFR calc Af Amer 31 (*) >90 mL/min   GLUCOSE, CAPILLARY     Status: Abnormal   Collection Time   08/26/12 11:52 AM      Component Value Range Comment   Glucose-Capillary 268 (*) 70 - 99 mg/dL   GLUCOSE, CAPILLARY     Status: Abnormal   Collection Time   08/26/12  4:29 PM      Component Value Range Comment   Glucose-Capillary 142 (*) 70 - 99 mg/dL    Comment 1 Notify RN     GLUCOSE, CAPILLARY     Status: Abnormal   Collection Time   08/26/12  9:02 PM      Component Value Range Comment   Glucose-Capillary 145 (*) 70 - 99 mg/dL    GLUCOSE, CAPILLARY     Status: Abnormal   Collection Time   08/27/12  7:10 AM      Component Value Range Comment   Glucose-Capillary 127 (*) 70 - 99 mg/dL    Comment 1 Notify RN     GLUCOSE, CAPILLARY     Status: Abnormal   Collection Time   08/27/12 11:45 AM      Component Value Range Comment   Glucose-Capillary 233 (*) 70 - 99 mg/dL    Comment 1 Notify RN     GLUCOSE, CAPILLARY     Status: Abnormal   Collection Time   08/27/12  4:18 PM      Component Value Range Comment   Glucose-Capillary 156 (*) 70 - 99 mg/dL       Vitals reviewed.  Constitutional: He is oriented to person, place, and time.  HENT:  Head: Normocephalic.  Eyes:  Pupils round and reactive to light  Neck: Normal range of motion. Neck supple. No thyromegaly present.  Cardiovascular: Normal rate and regular rhythm.  Pulmonary/Chest: Effort normal and breath sounds normal. No respiratory distress.  Abdominal: Soft. Bowel sounds are normal. There is no tenderness.  Neurological: He is alert and oriented to person, place, and time.  Follow simple commands. Dense right HP. Does display 1 to 1+ strength in right deltoid, bicep in flexor synergy pattern. Distal RUE is 0.5. Right HF is 1/5 and again distally is 0/5. DTR's are 2+ to 3+ on the right. Senses pain in the right arm, leg. No facial droop or tongue deviation. Cognitively appropriate. Speech is clear.  Skin: Skin is warm and dry.  Psychiatric: He has a normal mood and affect.   Assessment/Plan: 1. Functional deficits secondary to Left corona radiata infarct which require 3+ hours per  day of interdisciplinary therapy in a comprehensive inpatient rehab setting. Physiatrist is providing close team supervision and 24 hour management of active medical problems listed below. Physiatrist and rehab team continue to assess barriers to discharge/monitor patient progress toward functional and medical goals. FIM: FIM - Bathing Bathing Steps Patient Completed: Chest;Right  Arm;Abdomen;Front perineal area;Right upper leg;Left upper leg;Right lower leg (including foot);Left lower leg (including foot) Bathing: 4: Min-Patient completes 8-9 3f 10 parts or 75+ percent  FIM - Upper Body Dressing/Undressing Upper body dressing/undressing steps patient completed: Thread/unthread left sleeve of pullover shirt/dress;Put head through opening of pull over shirt/dress;Pull shirt over trunk Upper body dressing/undressing: 4: Min-Patient completed 75 plus % of tasks FIM - Lower Body Dressing/Undressing Lower body dressing/undressing steps patient completed: Thread/unthread right pants leg;Thread/unthread left pants leg;Thread/unthread right underwear leg;Thread/unthread left underwear leg;Pull underwear up/down;Pull pants up/down;Don/Doff right shoe;Don/Doff left shoe;Fasten/unfasten right shoe;Fasten/unfasten left shoe Lower body dressing/undressing: 4: Min-Patient completed 75 plus % of tasks  FIM - Toileting Toileting steps completed by patient: Performs perineal hygiene;Adjust clothing prior to toileting Toileting Assistive Devices: Grab bar or rail for support Toileting: 3: Mod-Patient completed 2 of 3 steps  FIM - Radio producer Devices: Grab bars Toilet Transfers: 4-To toilet/BSC: Min A (steadying Pt. > 75%);4-From toilet/BSC: Min A (steadying Pt. > 75%)  FIM - Bed/Chair Transfer Bed/Chair Transfer Assistive Devices: Arm rests Bed/Chair Transfer: 4: Bed > Chair or W/C: Min A (steadying Pt. > 75%);4: Chair or W/C > Bed: Min A (steadying Pt. > 75%)  FIM - Locomotion: Wheelchair Distance: 50 with RLE only propulsion Locomotion: Wheelchair: 2: Travels 50 - 149 ft with moderate assistance (Pt: 50 - 74%) FIM - Locomotion: Ambulation Locomotion: Ambulation Assistive Devices: Other (comment) (wall rail) Ambulation/Gait Assistance: 3: Mod assist Locomotion: Ambulation: 1: Travels less than 50 ft with moderate assistance (Pt: 50 -  74%)  Comprehension Comprehension Mode: Auditory Comprehension: 6-Follows complex conversation/direction: With extra time/assistive device  Expression Expression Mode: Verbal Expression: 6-Expresses complex ideas: With extra time/assistive device  Social Interaction Social Interaction: 6-Interacts appropriately with others with medication or extra time (anti-anxiety, antidepressant).  Problem Solving Problem Solving: 5-Solves complex 90% of the time/cues < 10% of the time  Memory Memory: 5-Recognizes or recalls 90% of the time/requires cueing < 10% of the time  Medical Problem List and Plan:  1. Left thrombotic subcortical infarct  2. DVT Prophylaxis/Anticoagulation: SCDs. Monitor for any signs of DVT  3. Neuropsych: This patient is capable of making decisions on his/her own behalf.  -will need some help with coping skills  -team to provide egosupport as needed  4. Hypertension.Uncontrolled, Increase Hydralazine 25mg  3 times a day, labetalol 200 mg 3 times a day. Monitor with increased activity  5. Diabetes mellitus.Uncontrolled but improving Hemoglobin A1c 7.5. Presently on sliding scale insulin. Check blood sugars a.c. and at bedtime.  Patient on Glucophage 1000 mg twice daily prior to admission and will resume as tolerated Currently creatinine is elevated 6. Hyperlipidemia. Zocor  7. Tobacco abuse/marijuana. NicoDerm patch and provide drug Counseling Discussed no smoking policy.  May have indoors pass with staff  8. Chronic renal insufficiency with baseline creatinine 2.10. Latest creatinine 2.07 08/24/2012 a followup chemistries  08/25/2012   LOS (Days) 2 A FACE TO FACE EVALUATION WAS PERFORMED  KIRSTEINS,ANDREW E 08/27/2012, 5:16 PM

## 2012-08-27 NOTE — Progress Notes (Signed)
Physical Therapy Session Note  Patient Details  Name: Richard [name] MRN: [id number] Date of Birth: 06-10-1956  Today's Date: 08/27/2012 Time: 1105-1205 and 1300-1330 Time Calculation (min): 60 min and 30 min  Short Term Goals: Week 1:  PT Short Term Goal 1 (Week 1): Pt will perform functional transfers with min A PT Short Term Goal 2 (Week 1): Pt will propel w/c in home environment with supervision 25' PT Short Term Goal 3 (Week 1): Pt will gait with min A 25' in controlled environment  Skilled Therapeutic Interventions/Progress Updates:   Patient's fiance present; confirmed home set up, D/C plan and equipment available after D/C with both patient and fiance.  Performed sit > stand from bed to don shorts and stand pivot to w/c with min A.  In // bars performed pre gait training for RLE swing phase and stance phase with focus on anterior/posterior weight shifting, postural control and isolated activation of hip and knee flexors > extensors for swing > initial stance stepping over low obstacle for full R foot clearance x 10 reps and then activation of extensors on RLE during stance during LLE stepping over low obstacle with focus on R knee control to minimize genu recurvatum. Changed to gait training in hall way with LUE support on wall rail and use of ace wrap on R foot for DF assistance.  Performed forward and retro gait x 25' with mod A for placement of R foot (patient able to advance with verbal cues) and to assist with R knee control in stance.  Patient fatigued quickly and required sitting rest break.  Discussed with MD use of BIONESS next week once brain stimulator removed.  MD verbally okayed patient to use BIONESS next week for DF and hamstring training.  Performed R quad and hamstring training with w/c propulsion x 50' with RLE propulsion only with mod A.    PM session: Patient with stimulator on; performed gait assessment and training and NMR to RLE with RW x 50' in controlled environment  continuing use of ace wrap on R foot for DF assistance and R hand orthosis with mod A overall for safe navigation with RW and verbal and visual cues for swing phase hip and knee flexion > extension for heel strike and manual facilitation for hip and knee extension during R stance for anterior translation of COG over BOS and cues to maintain upright posture.  Continued NMR with sit > squat from elevated mat with LUE support on w/c arm rest and maintaining squat during R and L lateral weight shifts.  Secondary to armrests being too low changed to use of w/c push handles and maintaining squat during lateral weight shifting and maintaining weight over RLE during LLE forward, retro and lateral stepping for forced use of RLE, sustained activation of glutes and hamstrings for increased knee motor control, timing, sequencing to prevent genu recurvatum.  Stand pivot to w/c min A. Therapy Documentation Precautions:  Precautions Precautions: Fall Restrictions Weight Bearing Restrictions: No Pain: Pain Assessment Pain Assessment: No/denies pain Locomotion : Ambulation Ambulation/Gait Assistance: 3: Mod assist Wheelchair Mobility Distance: 50 with RLE only propulsion   See FIM for current functional status  Therapy/Group: Individual Therapy  Raylene Everts [facility] 08/27/2012, 12:34 PM

## 2012-08-28 ENCOUNTER — Inpatient Hospital Stay (HOSPITAL_COMMUNITY): Payer: Non-veteran care

## 2012-08-28 LAB — GLUCOSE, CAPILLARY
Glucose-Capillary: 113 mg/dL — ABNORMAL HIGH (ref 70–99)
Glucose-Capillary: 191 mg/dL — ABNORMAL HIGH (ref 70–99)

## 2012-08-28 MED ORDER — AMLODIPINE BESYLATE 5 MG PO TABS
5.0000 mg | ORAL_TABLET | Freq: Every day | ORAL | Status: DC
  Administered 2012-08-28 – 2012-09-03 (×7): 5 mg via ORAL
  Filled 2012-08-28 (×9): qty 1

## 2012-08-28 MED ORDER — ATORVASTATIN CALCIUM 20 MG PO TABS
20.0000 mg | ORAL_TABLET | Freq: Every day | ORAL | Status: DC
  Administered 2012-08-28 – 2012-09-02 (×6): 20 mg via ORAL
  Filled 2012-08-28 (×7): qty 1

## 2012-08-28 NOTE — Progress Notes (Signed)
Late entry for 2100 08/27/2012.      Patient slipped while standing from wheelchair to void in urinal and landed on his buttocks, no injury sustained, vss, except bp elevated 175/96...which is close to his range at present.   Patient was wearing non-skid proof socks and girlfriend was sitting right beside...they did not call for assist...Marland KitchenMarland Kitchenreviewed safety precautions with them...placed skid-proof socks on patient.    MD was notified with no new orders

## 2012-08-28 NOTE — Progress Notes (Signed)
Patient ID: Richard [name], male   DOB: 03-Aug-1956, 56 y.o.   MRN: [id number] 56 y.o. 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 Admitted 08/23/2012 with right-sided weakness. A CT scan showed no acute intracranial abnormalities and remote lacunar infarct. Neurology services has been consulted with review of most recent echocardiogram completed during workup of stroke in August of 2013 showed ejection fraction of 123456 grade 2 diastolic dysfunction and carotid Doppler showed no ICA stenosis.. Close monitoring of blood pressure 185/110 had been placed on nicardipine. Urine drug drug screen was positive for marijuana. Neurology services consulted suspect left brain subcortical infarct felt to be thrombotic secondary small vessel disease questionable medical noncompliance. Presently patient is maintained on Plavix therapy  Subjective/Complaints: He is mostly concerned about whether his light bill will be paid on time No other complaints.  Objective: Vital Signs: Blood pressure 154/95, pulse 72, temperature 97.9 F (36.6 C), temperature source Oral, resp. rate 20, height 6' (1.829 m), weight 170 lb 13.7 oz (77.5 kg), SpO2 99.00%.   well-developed well-nourished male in no acute distress. HEENT exam atraumatic, normocephalic, neck supple without jugular venous distention. Chest clear to auscultation cardiac exam S1-S2 are regular. Abdominal exam overweight with bowel sounds, soft and nontender. Extremities no edema.    Assessment/Plan: 1. Functional deficits secondary to Left corona radiata infarct  Medical Problem List and Plan:  1. Left thrombotic subcortical infarct  2. DVT Prophylaxis/Anticoagulation: SCDs. Monitor for any signs of DVT  3. Neuropsych: This patient is capable of making decisions on his/her own behalf.   4.  Hypertension.Uncontrolled, Increase Hydralazine 25mg  3 times a day, labetalol 200 mg 3 times a day. Monitor with increased activity Add norvasc_I. Will need to follow renal function-- would prefer he be on ACE-I Lab Results  Component Value Date   CREATININE 2.56* 08/26/2012   5. Diabetes mellitus.Uncontrolled but improving Hemoglobin A1c 7.5. Presently on sliding scale insulin. Check blood sugars a.c. and at bedtime.  CBG (last 3)   Basename 08/28/12 0801 08/27/12 2106 08/27/12 1618  GLUCAP 113* 128* 156*     Patient on Glucophage 1000 mg twice daily prior to admission and will resume as tolerated Currently creatinine is elevated 6. Hyperlipidemia. Zocor  7. Tobacco abuse/marijuana. NicoDerm patch and provide drug Counseling Discussed no smoking policy.  May have indoors pass with staff  8. Chronic renal insufficiency with baseline creatinine 2.10. Latest creatinine 2.07 08/24/2012 a followup chemistries  08/25/2012   LOS (Days) 3 A FACE TO FACE EVALUATION WAS PERFORMED  Richard [name] 08/28/2012, 9:19 AM

## 2012-08-28 NOTE — Progress Notes (Signed)
Occupational Therapy Note  Patient Details  Name: Richard [name] MRN: [id number] Date of Birth: Apr 08, 1956 Today's Date: 08/28/2012  Time:  1530-1600  (30 min) Pain:  None Individual session  Set pt up with bioness for LUE grasp and release activities. Pt. Needed minimal facilitation for complete finger extension expecially on the 3rd digit.   Instructions left in pt's room with pads.  Pt. Very excited about the facilitation to his left hand for extension/flexion.   Pt wants to use again very soon.      Lisa Roca 08/28/2012, 6:25 PM

## 2012-08-28 NOTE — Progress Notes (Signed)
Occupational Therapy Session Note  Patient Details  Name: Richard [name] MRN: [id number] Date of Birth: December 10, 1955  Today's Date: 08/28/2012 Time: 1400-1430 Time Calculation (min): 30 min  Short Term Goals: Week 1:  OT Short Term Goal 1 (Week 1): Bath: Supervision with shower to include sit and stand OT Short Term Goal 2 (Week 1): UB dressing:  Min assist with pull over shirt OT Short Term Goal 3 (Week 1): LB Dressing:  Min assist to include socks and shoes OT Short Term Goal 4 (Week 1): Toilet transfer: Min assist OT Short Term Goal 5 (Week 1): RUE Management:  No more than 2 VCs regarding management of RUE during all BADL and Functional Mobility.  Skilled Therapeutic Interventions: Neuromuscular re-ed with emphasis on weight-shifting, avoiding use of left UE during transfers as per local instruction, SROM of bil UE, dynamic sitting balance and static standing.   Patient expressed request to communicate with [place] regarding his status and need for clarification of charges with potential to transfer and receive treatments through [employer] due to financial hardship.   Patient was advised to seek Social Work support for assist.   Patient claimed he received a call from [employer] but did not know if person calling was affiliated with [facility] or [facility] components [facility]. Writer requested support from alternate OT provider (JE) for requested treatment using Bioness system.    Therapy Documentation Precautions:  Precautions Precautions: Fall Restrictions Weight Bearing Restrictions: No  Pain: Pain Assessment Pain Assessment: 0-10  See FIM for current functional status  Therapy/Group: Individual Therapy  Maryan Puls 08/28/2012, 4:18 PM

## 2012-08-29 ENCOUNTER — Encounter (HOSPITAL_COMMUNITY): Payer: Non-veteran care | Admitting: Occupational Therapy

## 2012-08-29 ENCOUNTER — Inpatient Hospital Stay (HOSPITAL_COMMUNITY): Payer: Non-veteran care | Admitting: Physical Therapy

## 2012-08-29 ENCOUNTER — Inpatient Hospital Stay (HOSPITAL_COMMUNITY): Payer: Non-veteran care | Admitting: Occupational Therapy

## 2012-08-29 LAB — GLUCOSE, CAPILLARY
Glucose-Capillary: 126 mg/dL — ABNORMAL HIGH (ref 70–99)
Glucose-Capillary: 139 mg/dL — ABNORMAL HIGH (ref 70–99)
Glucose-Capillary: 176 mg/dL — ABNORMAL HIGH (ref 70–99)

## 2012-08-29 NOTE — Progress Notes (Signed)
Physical Therapy Note  Patient Details  Name: Casmer Lane MRN: [id number] Date of Birth: 05/08/1956 Today's Date: 08/29/2012  1100-1155 (55 minutes) individual Pain: no reported pain Focus of treatment: Neuro re-ed LT LE; gait training Treatment: Transfers min assist squat pivot wc > mat ; sit to supine (mat) min assist LT LE; supine to sit SBA (mat) ; Neuro re-ed LT LE; AA hip flexion/extension in supine , AA hip abduction, hip ER/IR in hooklying; sit to stand min assist for balance/safety; gait 80 feet X 3 with RW + LT hand splint min assist + tactile cues at shoulder to facilitate left  hip extension in stance/ 50%- 60% left knee hyperextension in stance ( + ace wrap /shoe lift on left).  Livia Tarr,JIM 08/29/2012, 7:26 AM

## 2012-08-29 NOTE — Progress Notes (Signed)
0900 Pt. Counseled on the benefits of smoking cessation.  Continued education and reinforcements recommended.

## 2012-08-29 NOTE — Plan of Care (Signed)
Problem: RH SAFETY Goal: RH STG ADHERE TO SAFETY PRECAUTIONS W/ASSISTANCE/DEVICE STG Adhere to Safety Precautions With supervision  Outcome: Progressing Calls appropriately  Problem: RH KNOWLEDGE DEFICIT Goal: RH STG INCREASE KNOWLEDGE OF DIABETES Patient will be able to verbalize understanding of normal blood sugar level, the importance to medication and diet regimen.  Outcome: Not Progressing Continues to need reinforcements Goal: RH STG INCREASE KNOWLEDGE OF HYPERTENSION Patient will be able to verbalize understanding of normal BP, complaint with medication and diet regimen.  Outcome: Not Progressing Continues to need reinforcement; links to smoking and hypertension

## 2012-08-29 NOTE — Progress Notes (Signed)
Physical Therapy Note  Patient Details  Name: Farrell Stiteler MRN: [id number] Date of Birth: 07-01-1956 Today's Date: 08/29/2012  Time: 1330-1410 40 minutes  Pt c/o knee pain after wearing knee cage, eased when brace removed.  Gait training with R LE wrapped in DF and knee cage to prevent hyperextension.  Gait with RW 3 x 50' with min A, facilitation for hip extension which helps prevent knee hyperextension, improved ability to advance R LE.  Standing NMR with focus on R LE control in stance phase with wt shifts, side steps, reaching activities.  Pt requires tactile cues for R knee control and fatigues easily with forced use.    Individual therapy   DONAWERTH,KAREN 08/29/2012, 2:12 PM

## 2012-08-29 NOTE — Progress Notes (Signed)
1825  Pt. Exhibits unsafe behavior despite medical recommendation. Call bell in close reach; Bed Alarm set for safety.  Continue to monitor closely Q shift for fall prevention.

## 2012-08-29 NOTE — Progress Notes (Signed)
Occupational Therapy Session Note  Patient Details  Name: Richard [name] MRN: [id number] Date of Birth: Feb 16, 1956  Today's Date: 08/29/2012 Time: 1615-1700 Time Calculation (min): 45 min  Skilled Therapeutic Interventions/Progress Updates: R UE and LE neuro reeducation via weight bearing, seated and standing; sitting and challenged seated balance, and trunk stability work.  Patient with difficulty sit to stand in balanced, posturally correct posture in coming to standing.  He acknowledge this before he initiated the task and stated, "That's hard."  As well, in standing he required Mod to Max A to maintain postural control and trunk stability for safe and correct posture.  Patient voiced lighthearted comments and tolerated session well.     Therapy Documentation Precautions:  Precautions Precautions: Fall Restrictions Weight Bearing Restrictions: No General: General Amount of Missed OT Time (min): 60 Minutes ( 60 min due to scheduling error)  Pain: denied    See FIM for current functional status  Therapy/Group: Individual Therapy  Alfredia Ferguson [facility] 08/29/2012, 5:49 PM

## 2012-08-29 NOTE — Progress Notes (Signed)
Patient ID: Richard [name], male   DOB: [birth year], 56 y.o.   MRN: [id number] 56 y.o. 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 Admitted 08/23/2012 with right-sided weakness. A CT scan showed no acute intracranial abnormalities and remote lacunar infarct. Neurology services has been consulted with review of most recent echocardiogram completed during workup of stroke in August of 2013 showed ejection fraction of 123456 grade 2 diastolic dysfunction and carotid Doppler showed no ICA stenosis.. Close monitoring of blood pressure 185/110 had been placed on nicardipine. Urine drug drug screen was positive for marijuana. Neurology services consulted suspect left brain subcortical infarct felt to be thrombotic secondary small vessel disease questionable medical noncompliance. Presently patient is maintained on Plavix therapy  Subjective/Complaints: He states he is bored but otherwise no complaints.  Objective: Vital Signs: Blood pressure 147/80, pulse 79, temperature 98.3 F (36.8 C), temperature source Oral, resp. rate 20, height 6' (1.829 m), weight 170 lb 13.7 oz (77.5 kg), SpO2 97.00%.   well-developed well-nourished male in no acute distress. HEENT exam atraumatic, normocephalic, neck supple without jugular venous distention. Chest clear to auscultation cardiac exam S1-S2 are regular. Abdominal exam overweight with bowel sounds, soft and nontender. Extremities no edema.    Assessment/Plan: 1. Functional deficits secondary to Left corona radiata infarct  Medical Problem List and Plan:  1. Left thrombotic subcortical infarct  2. DVT Prophylaxis/Anticoagulation: SCDs. Monitor for any signs of DVT  3. Neuropsych: This patient is capable of making decisions on his/her own behalf.   4. Hypertension.Uncontrolled, Increase Hydralazine 25mg  3 times a  day, labetalol 200 mg 3 times a day. Monitor with increased activity Add norvasc_I. Will need to follow renal function-- would prefer he be on ACE-I BP Readings from Last 3 Encounters:  08/29/12 147/80  08/25/12 149/76  05/23/12 168/85   starte amlodipine 08/28/12 Will watch bp for now  5. Diabetes mellitus.Uncontrolled but improving Hemoglobin A1c 7.5. Presently on sliding scale insulin. Check blood sugars a.c. and at bedtime.  CBG (last 3)   Basename 08/29/12 0743 08/28/12 2041 08/28/12 1636  GLUCAP 126* 151* 269*     Patient on Glucophage 1000 mg twice daily prior to admission and will resume as tolerated Currently creatinine is elevated 6. Hyperlipidemia. Zocor  7. Tobacco abuse/marijuana. NicoDerm patch and provide drug Counseling Discussed no smoking policy.  May have indoors pass with staff  8. Chronic renal insufficiency with baseline creatinine 2.10.  bmet 12/9  LOS (Days) 4 A FACE TO FACE EVALUATION WAS PERFORMED  SWORDS,BRUCE [name] 08/29/2012, 9:00 AM

## 2012-08-30 ENCOUNTER — Inpatient Hospital Stay (HOSPITAL_COMMUNITY): Payer: Non-veteran care | Admitting: *Deleted

## 2012-08-30 ENCOUNTER — Inpatient Hospital Stay (HOSPITAL_COMMUNITY): Payer: Non-veteran care | Admitting: Physical Therapy

## 2012-08-30 ENCOUNTER — Encounter (HOSPITAL_COMMUNITY): Payer: Non-veteran care | Admitting: Occupational Therapy

## 2012-08-30 DIAGNOSIS — G8110 Spastic hemiplegia affecting unspecified side: Secondary | ICD-10-CM

## 2012-08-30 DIAGNOSIS — I6330 Cerebral infarction due to thrombosis of unspecified cerebral artery: Secondary | ICD-10-CM

## 2012-08-30 LAB — BASIC METABOLIC PANEL
Calcium: 9.3 mg/dL (ref 8.4–10.5)
Chloride: 102 mEq/L (ref 96–112)
Creatinine, Ser: 2.05 mg/dL — ABNORMAL HIGH (ref 0.50–1.35)
GFR calc Af Amer: 40 mL/min — ABNORMAL LOW (ref >90)
Sodium: 138 mEq/L (ref 135–145)

## 2012-08-30 LAB — GLUCOSE, CAPILLARY: Glucose-Capillary: 157 mg/dL — ABNORMAL HIGH (ref 70–99)

## 2012-08-30 NOTE — Progress Notes (Signed)
Patient ID: Richard [name], male   DOB: 10/01/1955, 56 y.o.   MRN: [id number] 56 y.o. 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 Admitted 08/23/2012 with right-sided weakness. A CT scan showed no acute intracranial abnormalities and remote lacunar infarct. Neurology services has been consulted with review of most recent echocardiogram completed during workup of stroke in August of 2013 showed ejection fraction of [id number] grade 2 diastolic dysfunction and carotid Doppler showed no ICA stenosis.. Close monitoring of blood pressure 185/110 had been placed on nicardipine. Urine drug drug screen was positive for marijuana. Neurology services consulted suspect left brain subcortical infarct felt to be thrombotic secondary small vessel disease questionable medical noncompliance. Presently patient is maintained on Plavix therapy  Subjective/Complaints: R shoulder pain when he sleeps on it Extensor spasms in RUE and RLE Objective: Vital Signs: Blood pressure 160/90, pulse 80, temperature 98 F (36.7 C), temperature source Oral, resp. rate 17, height 6' (1.829 m), weight 77.5 kg (170 lb 13.7 oz), SpO2 100.00%. No results found. Results for orders placed during the hospital encounter of 08/25/12 (from the past 72 hour(s))  GLUCOSE, CAPILLARY     Status: Abnormal   Collection Time   08/27/12 11:45 AM      Component Value Range Comment   Glucose-Capillary 233 (*) 70 - 99 mg/dL    Comment 1 Notify RN     GLUCOSE, CAPILLARY     Status: Abnormal   Collection Time   08/27/12  4:18 PM      Component Value Range Comment   Glucose-Capillary 156 (*) 70 - 99 mg/dL   GLUCOSE, CAPILLARY     Status: Abnormal   Collection Time   08/27/12  9:06 PM      Component Value Range Comment   Glucose-Capillary 128 (*) 70 - 99 mg/dL    Comment 1 Notify RN     GLUCOSE,  CAPILLARY     Status: Abnormal   Collection Time   08/28/12  8:01 AM      Component Value Range Comment   Glucose-Capillary 113 (*) 70 - 99 mg/dL    Comment 1 Notify RN     GLUCOSE, CAPILLARY     Status: Abnormal   Collection Time   08/28/12 12:21 PM      Component Value Range Comment   Glucose-Capillary 191 (*) 70 - 99 mg/dL    Comment 1 Notify RN     GLUCOSE, CAPILLARY     Status: Abnormal   Collection Time   08/28/12  4:36 PM      Component Value Range Comment   Glucose-Capillary 269 (*) 70 - 99 mg/dL    Comment 1 Notify RN     GLUCOSE, CAPILLARY     Status: Abnormal   Collection Time   08/28/12  8:41 PM      Component Value Range Comment   Glucose-Capillary 151 (*) 70 - 99 mg/dL    Comment 1 Notify RN     GLUCOSE, CAPILLARY     Status: Abnormal   Collection Time   08/29/12  7:43 AM      Component Value Range Comment   Glucose-Capillary 126 (*) 70 - 99 mg/dL    Comment 1 Notify RN     GLUCOSE, CAPILLARY     Status: Abnormal   Collection Time   08/29/12 11:55 AM  Component Value Range Comment   Glucose-Capillary 139 (*) 70 - 99 mg/dL    Comment 1 Notify RN     GLUCOSE, CAPILLARY     Status: Abnormal   Collection Time   08/29/12  5:02 PM      Component Value Range Comment   Glucose-Capillary 176 (*) 70 - 99 mg/dL    Comment 1 Notify RN     GLUCOSE, CAPILLARY     Status: Abnormal   Collection Time   08/29/12  8:09 PM      Component Value Range Comment   Glucose-Capillary 196 (*) 70 - 99 mg/dL    Comment 1 Notify RN     BASIC METABOLIC PANEL     Status: Abnormal   Collection Time   08/30/12  7:15 AM      Component Value Range Comment   Sodium 138  135 - 145 mEq/L    Potassium 4.1  3.5 - 5.1 mEq/L    Chloride 102  96 - 112 mEq/L    CO2 27  19 - 32 mEq/L    Glucose, Bld 133 (*) 70 - 99 mg/dL    BUN 28 (*) 6 - 23 mg/dL    Creatinine, Ser 2.05 (*) 0.50 - 1.35 mg/dL    Calcium 9.3  8.4 - 10.5 mg/dL    GFR calc non Af Amer 35 (*) >90 mL/min    GFR calc Af Amer 40 (*)  >90 mL/min   GLUCOSE, CAPILLARY     Status: Abnormal   Collection Time   08/30/12  7:19 AM      Component Value Range Comment   Glucose-Capillary 119 (*) 70 - 99 mg/dL    Comment 1 Notify RN         Vitals reviewed.  Constitutional: He is oriented to person, place, and time.  HENT:  Head: Normocephalic.  Eyes:  Pupils round and reactive to light  Neck: Normal range of motion. Neck supple. No thyromegaly present.  Cardiovascular: Normal rate and regular rhythm.  Pulmonary/Chest: Effort normal and breath sounds normal. No respiratory distress.  Abdominal: Soft. Bowel sounds are normal. There is no tenderness.  Neurological: He is alert and oriented to person, place, and time.  Follow simple commands. Dense right HP. Does display 1 to 1+ strength in right deltoid, bicep in flexor synergy pattern. Distal RUE is 0.5. Right HF is 1/5 and again distally is 0/5. DTR's are 2+ to 3+ on the right. Senses pain in the right arm, leg. No facial droop or tongue deviation. Cognitively appropriate. Speech is clear.  Skin: Skin is warm and dry.  Psychiatric: He has a normal mood and affect.   Assessment/Plan: 1. Functional deficits secondary to Left corona radiata infarct which require 3+ hours per day of interdisciplinary therapy in a comprehensive inpatient rehab setting. Physiatrist is providing close team supervision and 24 hour management of active medical problems listed below. Physiatrist and rehab team continue to assess barriers to discharge/monitor patient progress toward functional and medical goals. FIM: FIM - Bathing Bathing Steps Patient Completed: Chest;Right Arm;Abdomen;Front perineal area;Right upper leg;Left upper leg;Right lower leg (including foot);Left lower leg (including foot) Bathing:  (Girl friend did bath 08/28/12)  FIM - Upper Body Dressing/Undressing Upper body dressing/undressing steps patient completed: Thread/unthread left sleeve of pullover shirt/dress;Put head through  opening of pull over shirt/dress;Pull shirt over trunk Upper body dressing/undressing: 4: Min-Patient completed 75 plus % of tasks FIM - Lower Body Dressing/Undressing Lower body dressing/undressing steps patient completed:  Thread/unthread right pants leg;Thread/unthread left pants leg;Thread/unthread right underwear leg;Thread/unthread left underwear leg;Pull underwear up/down;Pull pants up/down;Don/Doff right shoe;Don/Doff left shoe;Fasten/unfasten right shoe;Fasten/unfasten left shoe Lower body dressing/undressing: 4: Min-Patient completed 75 plus % of tasks  FIM - Toileting Toileting steps completed by patient: Performs perineal hygiene;Adjust clothing prior to toileting Toileting Assistive Devices: Grab bar or rail for support Toileting: 3: Mod-Patient completed 2 of 3 steps  FIM - Radio producer Devices: Grab bars Toilet Transfers: 4-To toilet/BSC: Min A (steadying Pt. > 75%);4-From toilet/BSC: Min A (steadying Pt. > 75%)  FIM - Bed/Chair Transfer Bed/Chair Transfer Assistive Devices: Arm rests Bed/Chair Transfer: 4: Chair or W/C > Bed: Min A (steadying Pt. > 75%);4: Bed > Chair or W/C: Min A (steadying Pt. > 75%)  FIM - Locomotion: Wheelchair Distance: 50 with RLE only propulsion Locomotion: Wheelchair: 2: Travels 50 - 149 ft with moderate assistance (Pt: 50 - 74%) FIM - Locomotion: Ambulation Locomotion: Ambulation Assistive Devices: Other (comment) (wall rail) Ambulation/Gait Assistance: 3: Mod assist Locomotion: Ambulation: 1: Travels less than 50 ft with moderate assistance (Pt: 50 - 74%)  Comprehension Comprehension Mode: Auditory Comprehension: 6-Follows complex conversation/direction: With extra time/assistive device  Expression Expression Mode: Verbal Expression: 6-Expresses complex ideas: With extra time/assistive device  Social Interaction Social Interaction Mode: Asleep Social Interaction: 6-Interacts appropriately with others with  medication or extra time (anti-anxiety, antidepressant).  Problem Solving Problem Solving Mode: Asleep Problem Solving: 5-Solves complex 90% of the time/cues < 10% of the time  Memory Memory Mode: Asleep Memory: 5-Recognizes or recalls 90% of the time/requires cueing < 10% of the time  Medical Problem List and Plan:  1. Left thrombotic subcortical infarct  2. DVT Prophylaxis/Anticoagulation: SCDs. Monitor for any signs of DVT  3. Neuropsych: This patient is capable of making decisions on his/her own behalf.  -will need some help with coping skills  -team to provide egosupport as needed  4. Hypertension.Uncontrolled, Increase Hydralazine 25mg  3 times a day, labetalol 200 mg 3 times a day. Monitor with increased activity  5. Diabetes mellitus.Uncontrolled but improving Hemoglobin A1c 7.5. Presently on sliding scale insulin. Check blood sugars a.c. and at bedtime.  Patient on Glucophage 1000 mg twice daily prior to admission and will resume as tolerated Currently creatinine is elevated 6. Hyperlipidemia. Zocor  7. Tobacco abuse/marijuana. NicoDerm patch and provide drug Counseling Discussed no smoking policy.  May have indoors pass with staff  8. Chronic renal insufficiency with baseline creatinine 2.10. Latest creatinine 2.07 08/24/2012 a followup chemistries  08/25/2012   LOS (Days) 5 A FACE TO FACE EVALUATION WAS PERFORMED  KIRSTEINS,ANDREW E 08/30/2012, 9:04 AM

## 2012-08-30 NOTE — Progress Notes (Signed)
Physical Therapy Session Note  Patient Details  Name: Wasif Burruel MRN: [id number] Date of Birth: Jun 23, 1956  Today's Date: 08/30/2012 Time: [id number] Time Calculation (min): 45 min  Short Term Goals: Week 1:  PT Short Term Goal 1 (Week 1): Pt will perform functional transfers with min A PT Short Term Goal 2 (Week 1): Pt will propel w/c in home environment with supervision 25' PT Short Term Goal 3 (Week 1): Pt will gait with min A 25' in controlled environment  Skilled Therapeutic Interventions/Progress Updates:    Patient received supine in bed. Patient with requests to use the restroom at the end of session. Patient assisted to bathroom via wheelchair, uses grab bars to stand, and requires supervision while standing in front of toilet. At end of session, patient left seated in wheelchair with all needs within reach and fiance present. ?Use of safety belt with RN and RN states not necessary. Patient encouraged to call for assistance.  Therapy Documentation Precautions:  Precautions Precautions: Fall Restrictions Weight Bearing Restrictions: No Pain: Pain Assessment Pain Score:   7 Mobility: Bed Mobility Bed Mobility: Supine to Sit;Sit to Supine Supine to Sit: 5: Supervision Supine to Sit Details: Verbal cues for precautions/safety;Verbal cues for sequencing Sit to Supine: 5: Supervision Sit to Supine - Details: Verbal cues for precautions/safety Transfers Stand Pivot Transfers: 3: Mod assist;With armrests Stand Pivot Transfer Details: Verbal cues for technique;Verbal cues for precautions/safety;Verbal cues for sequencing;Verbal cues for gait pattern;Tactile cues for posture;Tactile cues for initiation;Tactile cues for weight shifting Locomotion : Ambulation Ambulation/Gait Assistance: Not tested (comment) Wheelchair Mobility Wheelchair Mobility: Yes Wheelchair Assistance: 5: Supervision Wheelchair Assistance Details: Verbal cues for precautions/safety;Verbal cues for safe  use of DME/AE Wheelchair Propulsion: Left upper extremity;Left lower extremity Wheelchair Parts Management: Needs assistance Distance: 150  Other Treatments: Treatments B hamstring and gastroc stretches 3x30" in sitting. R hip IR/ER stretching 2x30" each direction. Neuromuscular Facilitation: Right;Lower Extremity;Activity to increase coordination;Activity to increase motor control;Activity to increase timing and sequencing;Activity to increase sustained activation;Other (comment) (PNF D1 Pattern R LE)  Bridging 2 sets x10 repetitions to facilitate increased activation/contraction of R gluts. R LE heel slides 2 sets x10 repetitions with emphasis on slow controlled movements and sustained activation.  See FIM for current functional status  Therapy/Group: Individual Therapy  Lillia Abed. Detron Carras, PT, DPT  08/30/2012, 1:42 PM

## 2012-08-30 NOTE — Plan of Care (Signed)
Problem: RH BOWEL ELIMINATION Goal: RH STG MANAGE BOWEL WITH ASSISTANCE STG Manage Bowel with min Assistance.  Outcome: Progressing Pt is very modest and will not allow staff in to assist when on toilet; GF reports that pt does all activities himself; will not allow her to help either.  Reinforced availability of staff and safety measures  Problem: RH SKIN INTEGRITY Goal: RH STG MAINTAIN SKIN INTEGRITY WITH ASSISTANCE STG Maintain Skin Integrity With min Assistance.  Outcome: Progressing Skin assessment negative; no issues noted  Problem: RH KNOWLEDGE DEFICIT Goal: RH STG INCREASE KNOWLEDGE OF DIABETES Patient will be able to verbalize understanding of normal blood sugar level, the importance to medication and diet regimen.  Outcome: Not Progressing Does ask appropriate questions however will need reinforcement of CBG monitoring and administration of insulin if sent home doing this; DM was diet controlled prior to admission; pt not interested in doing additional measures Goal: RH STG INCREASE KNOWLEDGE OF HYPERTENSION Patient will be able to verbalize understanding of normal BP, complaint with medication and diet regimen.  Outcome: Not Progressing Needs review of monitoring of BP and diet choices; have reviewed heart healthy diet and low sodium/low fat diets with fiance and patient.

## 2012-08-30 NOTE — Progress Notes (Signed)
Social Work Patient ID: Richard [name], male   DOB: April 10, 1956, 56 y.o.   MRN: [id number] Met with pt and finance to discussed again his light bill and options.  Informed finance to go down to DSS to get application for pt to fill out. I was told pt has to go in person or a family member.  He also has questions regarding a surgery he was suppose to have at the [place] but now is here. Discussed he would need to se [facility]'s and they would clear for surgery but at times after someone has had a stroke there is a waiting period for recovery and Stabilization.  He is very focused on his light bill and asked finance to assist him with this.  She reports she will go to DSS and see what she can do.  Work on  Discharge plans, informed pt he would need someone with him at discharge.  Letter for [facility] completed awaiting MD to sign, give to pt tomorrow.

## 2012-08-30 NOTE — Progress Notes (Signed)
Patient went on grounds pass with girlfriend, reminded not to smoke. Continues to transfer self out of wheelchair to bed without calling for assistance. Using call light for assistance OOB. Bed alarm in place to alert staff of unsafe attempts OOB and [name] reminder for patient to call staff for assistance. Reports financially able to obtain meds thru [place]. "I've just been stubborn in my past". Reminded to take meds as prescribed. Using urinal with staff emptying. Richard [name]

## 2012-08-30 NOTE — Progress Notes (Signed)
Physical Therapy Session Note  Patient Details  Name: Richard [name] MRN: [id number] Date of Birth: [birth year]  Today's Date: 08/30/2012 Time: 1105-1200 and 1600-1630 Time Calculation (min): 55 min and 30 min  Short Term Goals: Week 1:  PT Short Term Goal 1 (Week 1): Pt will perform functional transfers with min A PT Short Term Goal 2 (Week 1): Pt will propel w/c in home environment with supervision 25' PT Short Term Goal 3 (Week 1): Pt will gait with min A 25' in controlled environment  Therapy Documentation Precautions:  Precautions Precautions: Fall Restrictions Weight Bearing Restrictions: No Pain:  Patient reporting discomfort and pain in low back from lack of rest and discomfort of hospital bed.  Patient refused pain medication but would like something to help him sleep at night.  Heat applied after therapy session to low back and RN notified about pt c/o not resting and requesting sleep aide at night. MLocomotion : Ambulation Ambulation/Gait Assistance: 3: Mod assist Wheelchair Mobility Distance: 150  Other Treatments: Treatments Neuromuscular Facilitation: Right;Lower Extremity;Activity to increase coordination;Activity to increase motor control;Activity to increase timing and sequencing;Activity to increase sustained activation Modalities Modalities: Field seismologist Location: R anterior tibialis and R hamstrings Electrical Stimulation Action: R ankle DF and knee flexion control for improved gait sequencing for increased independence and safety Electrical Stimulation Parameters: See BIONESS control unit Electrical Stimulation Goals: Neuromuscular facilitation  PM session: administrated BIONESS to R hamstring and anterior tibialis muscle groups for NMR for motor control, timing, sequencing, coordination of knee flexion and ankle DF during stair negotiation up and down 5 stairs x 2 reps with L hand rail and min-mod A  overall for swing and stance phase control and during gait training with LUE support with hemi walker x 75' with min A overall with verbal cues needed for increased stance time RLE and full step length LLE.  Patient very fatigued this pm but tolerated well.  See FIM for current functional status  Therapy/Group: Individual Therapy  Raylene Everts [facility] 08/30/2012, 12:22 PM

## 2012-08-30 NOTE — Progress Notes (Signed)
Occupational Therapy Session Note  Patient Details  Name: Richard [name] MRN: [id number] Date of Birth: 1955-10-25  Today's Date: 08/30/2012 Time: 0930-1025 Time Calculation (min): 55 min  Short Term Goals: Week 1:  OT Short Term Goal 1 (Week 1): Bath: Supervision with shower to include sit and stand OT Short Term Goal 2 (Week 1): UB dressing:  Min assist with pull over shirt OT Short Term Goal 3 (Week 1): LB Dressing:  Min assist to include socks and shoes OT Short Term Goal 4 (Week 1): Toilet transfer: Min assist OT Short Term Goal 5 (Week 1): RUE Management:  No more than 2 VCs regarding management of RUE during all BADL and Functional Mobility.  Skilled Therapeutic Interventions/Progress Updates:    Pt seen for OT  Individual treatment session this am with focus on bathing and dressing at sink level (Pt declined shower level ADL secondary to modesty). Focus on WB RUE, weight shift, and use of RUE as stabilizer during ADL's. Pt's girlfriend assist pt perform peri care while standing at sink w/ OT off to side. Overall, pt is Min A LB/UB B/D. He will benefit from cont weight bearing, weight shifting RUE, transfer & ADL retraining to maximize I in these areas.  At conclusion of session, pt was given moist heat for R shoulder for 28min (Not included in treatment time) per pt request due to shoulder pain. Discussed positioning of RUE as well as basic stretch/WB to assist w/ decreasing pain/symptoms. Pt & pt's girlfriend verbalized understanding of this.  Therapy Documentation Precautions:  Precautions Precautions: Fall Restrictions Weight Bearing Restrictions: No     Pain: Pain Assessment Pain Score:   7     See FIM for current functional status  Therapy/Group: Individual Therapy  Carlynn Herald, Kalijah Westfall Beth Dixon 08/30/2012, 1:32 PM

## 2012-08-31 ENCOUNTER — Inpatient Hospital Stay (HOSPITAL_COMMUNITY): Payer: Non-veteran care | Admitting: Physical Therapy

## 2012-08-31 ENCOUNTER — Inpatient Hospital Stay (HOSPITAL_COMMUNITY): Payer: Non-veteran care | Admitting: Occupational Therapy

## 2012-08-31 LAB — GLUCOSE, CAPILLARY: Glucose-Capillary: 152 mg/dL — ABNORMAL HIGH (ref 70–99)

## 2012-08-31 MED ORDER — NORTRIPTYLINE HCL 10 MG PO CAPS
10.0000 mg | ORAL_CAPSULE | Freq: Every day | ORAL | Status: DC
  Administered 2012-08-31: 10 mg via ORAL
  Filled 2012-08-31 (×2): qty 1

## 2012-08-31 NOTE — Progress Notes (Addendum)
Physical Therapy Session Note  Patient Details  Name: Richard [name] MRN: [id number] Date of Birth: 1956-01-31  Today's Date: 08/31/2012 Time: 1120-1205 and [id number]  Time Calculation (min): 45 min and 25 min  Short Term Goals: Week 1:  PT Short Term Goal 1 (Week 1): Pt will perform functional transfers with min A PT Short Term Goal 2 (Week 1): Pt will propel w/c in home environment with supervision 25' PT Short Term Goal 3 (Week 1): Pt will gait with min A 25' in controlled environment  Skilled Therapeutic Interventions/Progress Updates:   Patient continues to c/o not resting well, significant fatigue and low back pain; patient reports h/o low back issues with "bulging disc". When asked how he managed low back discomfort in past he reports MD prescribed him stronger pain medication and recommends back surgery.     Therapy Documentation Precautions:  Precautions Precautions: Fall Restrictions Weight Bearing Restrictions: No Pain: Pain Assessment Pain Assessment: Faces Pain Score: 0-No pain Faces Pain Scale: Hurts even more Pain Type: Chronic pain Pain Location: Back Pain Orientation: Lower Pain Descriptors: Discomfort;Sore Pain Onset: On-going Pain Intervention(s): Other (Comment) (patient reports pain meds offered not sufficient) Locomotion : Ambulation Ambulation/Gait Assistance: 3: Mod assist Wheelchair Mobility Distance: 150  Other Treatments: Treatments Neuromuscular Facilitation: Right;Lower Extremity;Activity to increase coordination;Activity to increase motor control;Activity to increase timing and sequencing;Activity to increase sustained activation;Activity to increase lateral weight shifting;Activity to increase anterior-posterior weight shifting Electrical Stimulation Electrical Stimulation Location: R anterior tibialis and R hamstrings  Electrical Stimulation Action: R ankle DF and knee flexion control for improved gait sequencing  during gait training with L  hemi walker x 75' with mod A to facilitate wider BOS, hip IR, full anterior weight shift of COG over BOS for full step length of LLE and for stair negotiation training with L rail for increased independence and safety; secondary to rail on L patient has to descend laterally with LUE on rail but patient unable to cross RLE over to descend with RLE and had to descend with LLE with poor eccentric control with RLE; RLE locked in genu recurvatum and requiring mod A to unlock knee and control descent.    Electrical Stimulation Parameters: See BIONESS control unit Electrical Stimulation Goals: Neuromuscular facilitation  PM session: patient resting on mat after OT session; patient very discouraged about lack of progress, lack of rest and pain in back.  Continued NMR for RLE in prone with focus on selective and sustained concentric and eccentric activation of RLE during hamstring curls with AAROM; transitioned to quadruped to attempt WB through RUE and RLE for facilitation of activation of shoulder girdle muscles and tricep extension while lifting LUE; patient very fatigued and kept stating he just couldn't do it; transitioned to tall kneeling with UE support on bench with min-mod A at RUE to maintain shoulder ER and elbow extension during LUE reaching up off the bench to facilitate activation of RUE extensors and maintaining COG position.  Attempted talling kneeling squats for LE extensor and hamstring training but patient reporting increased back pain and unable to continue.  Transitioned to sitting edge of mat and offered patient emotional support and encouragement.  Patient states, "maybe I need to see someone about my emotions."   Patient may benefit from referral to neuropsychologist for coping.    See FIM for current functional status  Therapy/Group: Individual Therapy  [name] 08/31/2012, 12:20 PM

## 2012-08-31 NOTE — Progress Notes (Signed)
08/31/12- Nursing note Pt requested sleeping pill. Pill was discontinued today 08/31/12. ON call MD and charge nurse made aware and notified no new orders noted at this time. Will let oncoming shift know for clarification.

## 2012-08-31 NOTE — Progress Notes (Signed)
Occupational Therapy Session Notes  Patient Details  Name: Luan Leja MRN: [id number] Date of Birth: 09/16/1956  Today's Date: 08/31/2012 Time: 0900-1000 and 135-230 Time Calculation (min): 60 min and 55 min  Short Term Goals: Week 1:  OT Short Term Goal 1 (Week 1): Bath: Supervision with shower to include sit and stand OT Short Term Goal 2 (Week 1): UB dressing:  Min assist with pull over shirt OT Short Term Goal 3 (Week 1): LB Dressing:  Min assist to include socks and shoes OT Short Term Goal 4 (Week 1): Toilet transfer: Min assist OT Short Term Goal 5 (Week 1): RUE Management:  No more than 2 VCs regarding management of RUE during all BADL and Functional Mobility.  Skilled Therapeutic Interventions/Progress Updates:  1)  Self care retraining to include sponge bath (declined shower) dressing toileting and toilet transfer.  Focused session on safe stand pivot and squat pivot transfers, management of RUE during all functional mobility and BADL, forced use of RUE during BADL, lateral weight shifts only RLE for weight bearing.  Reviewed with patient and fiance, Mindy, methods/techniques to decrease swelling in right hand.  2) Patient and fiance sleeping in hospital bed upon arrival.  Patient very sleepy yet agreed to get up for therapy.  Focused session on use of the Bioness on RUE which provides therapeutic electrical stimulation to improve hand function while reducing edema.  Patient tolerated following amplitude settings:  Flexion at 9 and Extension at 10.  By end of exercises patient reports fatigue and minor discomfort with flexion.  Reviewed optimal bed positioning in an attempt to improve sleep and patient return demonstrated.  Due to LBP and overall discomfort, patient reports that he cannot sleep on his right side or back.  Patient left on therapy mat laying on his left side waiting for his next therapy with fiance at his side.   Therapy Documentation Precautions:   Precautions Precautions: Fall Restrictions Weight Bearing Restrictions: No Pain: Pain Assessment Pain Score: 0-No pain ADL: See FIM for current functional status  Therapy/Group: Individual Therapy  Aashrith Eves, [place] 08/31/2012, 10:28 AM

## 2012-08-31 NOTE — Progress Notes (Signed)
Patient ID: Richard [name], male   DOB: 1956/06/01, 56 y.o.   MRN: [id number] 56 y.o. 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 Admitted 08/23/2012 with right-sided weakness. A CT scan showed no acute intracranial abnormalities and remote lacunar infarct. Neurology services has been consulted with review of most recent echocardiogram completed during workup of stroke in August of 2013 showed ejection fraction of 123456 grade 2 diastolic dysfunction and carotid Doppler showed no ICA stenosis.. Close monitoring of blood pressure 185/110 had been placed on nicardipine. Urine drug drug screen was positive for marijuana. Neurology services consulted suspect left brain subcortical infarct felt to be thrombotic secondary small vessel disease questionable medical noncompliance. Presently patient is maintained on Plavix therapy  Subjective/Complaints: R shoulder pain when he sleeps on it parasthesia in RUE and RLE Objective: Vital Signs: Blood pressure 154/79, pulse 75, temperature 98.3 F (36.8 C), temperature source Oral, resp. rate 17, height 6' (1.829 m), weight 77.5 kg (170 lb 13.7 oz), SpO2 96.00%. No results found. Results for orders placed during the hospital encounter of 08/25/12 (from the past 72 hour(s))  GLUCOSE, CAPILLARY     Status: Abnormal   Collection Time   08/28/12 12:21 PM      Component Value Range Comment   Glucose-Capillary 191 (*) 70 - 99 mg/dL    Comment 1 Notify RN     GLUCOSE, CAPILLARY     Status: Abnormal   Collection Time   08/28/12  4:36 PM      Component Value Range Comment   Glucose-Capillary 269 (*) 70 - 99 mg/dL    Comment 1 Notify RN     GLUCOSE, CAPILLARY     Status: Abnormal   Collection Time   08/28/12  8:41 PM      Component Value Range Comment   Glucose-Capillary 151 (*) 70 - 99 mg/dL    Comment 1  Notify RN     GLUCOSE, CAPILLARY     Status: Abnormal   Collection Time   08/29/12  7:43 AM      Component Value Range Comment   Glucose-Capillary 126 (*) 70 - 99 mg/dL    Comment 1 Notify RN     GLUCOSE, CAPILLARY     Status: Abnormal   Collection Time   08/29/12 11:55 AM      Component Value Range Comment   Glucose-Capillary 139 (*) 70 - 99 mg/dL    Comment 1 Notify RN     GLUCOSE, CAPILLARY     Status: Abnormal   Collection Time   08/29/12  5:02 PM      Component Value Range Comment   Glucose-Capillary 176 (*) 70 - 99 mg/dL    Comment 1 Notify RN     GLUCOSE, CAPILLARY     Status: Abnormal   Collection Time   08/29/12  8:09 PM      Component Value Range Comment   Glucose-Capillary 196 (*) 70 - 99 mg/dL    Comment 1 Notify RN     BASIC METABOLIC PANEL     Status: Abnormal   Collection Time   08/30/12  7:15 AM      Component Value Range Comment   Sodium 138  135 - 145 mEq/L    Potassium 4.1  3.5 - 5.1 mEq/L    Chloride 102  96 - 112 mEq/L  CO2 27  19 - 32 mEq/L    Glucose, Bld 133 (*) 70 - 99 mg/dL    BUN 28 (*) 6 - 23 mg/dL    Creatinine, Ser 2.05 (*) 0.50 - 1.35 mg/dL    Calcium 9.3  8.4 - 10.5 mg/dL    GFR calc non Af Amer 35 (*) >90 mL/min    GFR calc Af Amer 40 (*) >90 mL/min   GLUCOSE, CAPILLARY     Status: Abnormal   Collection Time   08/30/12  7:19 AM      Component Value Range Comment   Glucose-Capillary 119 (*) 70 - 99 mg/dL    Comment 1 Notify RN     GLUCOSE, CAPILLARY     Status: Abnormal   Collection Time   08/30/12 12:04 PM      Component Value Range Comment   Glucose-Capillary 168 (*) 70 - 99 mg/dL    Comment 1 Notify RN     GLUCOSE, CAPILLARY     Status: Abnormal   Collection Time   08/30/12  3:53 PM      Component Value Range Comment   Glucose-Capillary 157 (*) 70 - 99 mg/dL   GLUCOSE, CAPILLARY     Status: Abnormal   Collection Time   08/30/12  8:15 PM      Component Value Range Comment   Glucose-Capillary 188 (*) 70 - 99 mg/dL    Comment 1  Notify RN     GLUCOSE, CAPILLARY     Status: Abnormal   Collection Time   08/31/12  7:40 AM      Component Value Range Comment   Glucose-Capillary 152 (*) 70 - 99 mg/dL    Comment 1 Notify RN         Vitals reviewed.  Constitutional: He is oriented to person, place, and time.  HENT:  Head: Normocephalic.  Eyes:  Pupils round and reactive to light  Neck: Normal range of motion. Neck supple. No thyromegaly present.  Cardiovascular: Normal rate and regular rhythm.  Pulmonary/Chest: Effort normal and breath sounds normal. No respiratory distress.  Abdominal: Soft. Bowel sounds are normal. There is no tenderness.  Neurological: He is alert and oriented to person, place, and time.  Follow simple commands. Dense right HP. Does display 1 to 1+ strength in right deltoid, bicep in flexor synergy pattern. Distal RUE is 0.5. Right HF is 1/5 and again distally is 0/5. DTR's are 2+ to 3+ on the right. Senses pain in the right arm, leg. No facial droop or tongue deviation. Cognitively appropriate. Speech is clear.  Skin: Skin is warm and dry.  Psychiatric: He has a normal mood and affect.   Assessment/Plan: 1. Functional deficits secondary to Left corona radiata infarct which require 3+ hours per day of interdisciplinary therapy in a comprehensive inpatient rehab setting. Physiatrist is providing close team supervision and 24 hour management of active medical problems listed below. Physiatrist and rehab team continue to assess barriers to discharge/monitor patient progress toward functional and medical goals. FIM: FIM - Bathing Bathing Steps Patient Completed: Chest;Right Arm;Abdomen;Front perineal area;Right upper leg;Left upper leg;Right lower leg (including foot);Left lower leg (including foot);Buttocks Bathing: 4: Min-Patient completes 8-9 62f 10 parts or 75+ percent  FIM - Upper Body Dressing/Undressing Upper body dressing/undressing steps patient completed: Thread/unthread right sleeve of  pullover shirt/dresss;Thread/unthread left sleeve of pullover shirt/dress;Put head through opening of pull over shirt/dress;Pull shirt over trunk Upper body dressing/undressing: 5: Set-up assist to: Obtain clothing/put away FIM - Lower  Body Dressing/Undressing Lower body dressing/undressing steps patient completed: Thread/unthread right underwear leg;Thread/unthread left underwear leg;Pull underwear up/down;Thread/unthread right pants leg;Thread/unthread left pants leg;Pull pants up/down;Don/Doff right sock;Don/Doff left sock;Don/Doff right shoe;Don/Doff left shoe Lower body dressing/undressing: 4: Min-Patient completed 75 plus % of tasks  FIM - Toileting Toileting steps completed by patient: Adjust clothing prior to toileting;Performs perineal hygiene;Adjust clothing after toileting Toileting Assistive Devices: Grab bar or rail for support Toileting: 6: Assistive device: No helper  FIM - Radio producer Devices: Grab bars Toilet Transfers: 4-To toilet/BSC: Min A (steadying Pt. > 75%);4-From toilet/BSC: Min A (steadying Pt. > 75%)  FIM - Bed/Chair Transfer Bed/Chair Transfer Assistive Devices: Arm rests Bed/Chair Transfer: 5: Sit > Supine: Supervision (verbal cues/safety issues);4: Chair or W/C > Bed: Min A (steadying Pt. > 75%);4: Bed > Chair or W/C: Min A (steadying Pt. > 75%);5: Supine > Sit: Supervision (verbal cues/safety issues)  FIM - Locomotion: Wheelchair Distance: 150 Locomotion: Wheelchair: 5: Travels 150 ft or more: maneuvers on rugs and over door sills with supervision, cueing or coaxing FIM - Locomotion: Ambulation Locomotion: Ambulation Assistive Devices: Other (comment) (wall rail and BIONESS) Ambulation/Gait Assistance: Not tested (comment) Locomotion: Ambulation: 0: Activity did not occur  Comprehension Comprehension Mode: Auditory Comprehension: 6-Follows complex conversation/direction: With extra time/assistive  device  Expression Expression Mode: Verbal Expression: 6-Expresses complex ideas: With extra time/assistive device  Social Interaction Social Interaction Mode: Asleep Social Interaction: 6-Interacts appropriately with others with medication or extra time (anti-anxiety, antidepressant).  Problem Solving Problem Solving Mode: Asleep Problem Solving: 5-Solves basic 90% of the time/requires cueing < 10% of the time  Memory Memory Mode: Asleep Memory: 5-Recognizes or recalls 90% of the time/requires cueing < 10% of the time  Medical Problem List and Plan:  1. 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 DVT Prophylaxis/Anticoagulation: SCDs. Monitor for any signs of DVT  3. Neuropsych: This patient is capable of making decisions on his/her own behalf.  -will need some help with coping skills  -team to provide egosupport as needed  4. Hypertension.Uncontrolled, Increase Hydralazine 25mg  3 times a day, labetalol 200 mg 3 times a day. Monitor with increased activity  5. Diabetes mellitus.Uncontrolled but improving Hemoglobin A1c 7.5. Presently on sliding scale insulin. Check blood sugars a.c. and at bedtime.  Patient on Glucophage 1000 mg twice daily prior to admission and will resume as tolerated Currently creatinine is elevated 6. Hyperlipidemia. Zocor  7. Tobacco abuse/marijuana. NicoDerm patch and provide drug Counseling Discussed no smoking policy.  May have indoors pass with staff  8. Chronic renal insufficiency with baseline creatinine 2.10. Latest creatinine 2.07 08/24/2012 a followup chemistries  08/25/2012   LOS (Days) 6 A FACE TO FACE EVALUATION WAS PERFORMED  KIRSTEINS,ANDREW E 08/31/2012, 8:42 AM

## 2012-08-31 NOTE — Progress Notes (Signed)
Social Work Patient ID: Richard [name], male   DOB: November 08, 1955, 56 y.o.   MRN: [id number] Gave pt letter for [facility] regarding relocating to a first floor apt.  He also wants a letter regarding his need for electricity and assistance with this. Will work on this.  Finance is here and observing in therapies.  She has not gone down to DSS yet to inquire about assistance with pt's light bill.

## 2012-09-01 ENCOUNTER — Inpatient Hospital Stay (HOSPITAL_COMMUNITY): Payer: Non-veteran care | Admitting: Physical Therapy

## 2012-09-01 ENCOUNTER — Inpatient Hospital Stay (HOSPITAL_COMMUNITY): Payer: Non-veteran care | Admitting: Occupational Therapy

## 2012-09-01 LAB — GLUCOSE, CAPILLARY
Glucose-Capillary: 125 mg/dL — ABNORMAL HIGH (ref 70–99)
Glucose-Capillary: 135 mg/dL — ABNORMAL HIGH (ref 70–99)

## 2012-09-01 MED ORDER — NORTRIPTYLINE HCL 25 MG PO CAPS
25.0000 mg | ORAL_CAPSULE | Freq: Every day | ORAL | Status: DC
  Administered 2012-09-01 – 2012-09-02 (×2): 25 mg via ORAL
  Filled 2012-09-01 (×3): qty 1

## 2012-09-01 NOTE — Progress Notes (Signed)
Physical Therapy Session Note  Patient Details  Name: Richard [name] MRN: [id number] Date of Birth: Jul 08, 1956  Today's Date: 09/01/2012 Time: 1400-1445 Time Calculation (min): 45 min  Short Term Goals: Week 1:  PT Short Term Goal 1 (Week 1): Pt will perform functional transfers with min A PT Short Term Goal 2 (Week 1): Pt will propel w/c in home environment with supervision 25' PT Short Term Goal 3 (Week 1): Pt will gait with min A 25' in controlled environment  Skilled Therapeutic Interventions/Progress Updates:   Patient now with R blue rocker AFO; patient removed 1/2 inch heel wedge secondary to discomfort; changed to 1/4 inch heel wedge with improved comfort.  Performed gait training in controlled environment x 150' with RW and R hand orthosis and then 75' x 2 with L hemi walker and R blue rocker with min-mod A to facilitate full anterior translation of COG over R stance LE and assist with R knee control to minimize genu recurvatum and verbal cues for upright trunk posture, for heel strike RLE and full step length LLE.   Patient reports that the hemi walker would be more functional for gait within his apartment and that he feels his energy expenditure is the same with both.  Will continue to train with hemi walker.  Discussed with patient stair negotiation sequence with L rail and demonstrated sequencing of ascending forwards leading with LLE and then descending backwards with RLE.  Patient gave repeat demonstration x 3 reps with LUE support on rail with mind-mod A overall to assist with full RLE hip and knee flexion for clearance and placement and for activation of extensors in stance.  Discussed with patient and fiance the need to formally practice full flight of stairs and simulated car transfer prior to D/C.  Patient reports they currently do not have a car but will be transporting in a friends truck/SUV [facility]??).    Therapy Documentation Precautions:  Precautions Precautions:  Fall Restrictions Weight Bearing Restrictions: No Vital Signs: Therapy Vitals Temp: 98.2 F (36.8 C) Temp src: Oral Pulse Rate: 68  Resp: 20  BP: 160/90 mmHg Patient Position, if appropriate: Sitting Oxygen Therapy SpO2: 100 % O2 Device: None (Room air) Pain: Pain Assessment Pain Assessment: No/denies pain  See FIM for current functional status  Therapy/Group: Individual Therapy  [name] 09/01/2012, 4:00 PM

## 2012-09-01 NOTE — Progress Notes (Signed)
Orthopedic Tech Progress Note Patient Details:  Jomari Rudis Sep 05, 1956 [id number]  Patient ID: Ronne Binning, male   DOB: June 22, 1956, 56 y.o.   MRN: [id number] Called in advanced brace order @1308  spoke with [facility]; specified brace order  Hildred Priest 09/01/2012, 1:09 PM

## 2012-09-01 NOTE — Progress Notes (Signed)
Occupational Therapy Session Notes  Patient Details  Name: Richard [name] MRN: [id number] Date of Birth: 24-May-1956  Today's Date: 09/01/2012 Time: 0900-1000 and  Time Calculation (min): 60 min and   Short Term Goals: Week 1:  OT Short Term Goal 1 (Week 1): Bath: Supervision with shower to include sit and stand OT Short Term Goal 2 (Week 1): UB dressing:  Min assist with pull over shirt OT Short Term Goal 3 (Week 1): LB Dressing:  Min assist to include socks and shoes OT Short Term Goal 4 (Week 1): Toilet transfer: Min assist OT Short Term Goal 5 (Week 1): RUE Management:  No more than 2 VCs regarding management of RUE during all BADL and Functional Mobility.  Precautions:  Precautions Precautions: Fall Restrictions Weight Bearing Restrictions: No Pain: 1)  Reports premorbid LBP declined medication, modified session. 2)  Denies pain during session Skilled Therapeutic Interventions/Progress Updates:  1)  Patient resting in w/c with fiance at his side upon arrival.  Patient declared that he had already performed all self care before arrival and is "ready for therapy".  Reviewed with patient that OT goals include increasing his independence with self care tasks yet he requires encouragement to perform these tasks as independently as possible stating that [place] will do these for me, "she babys me".  FIM scores today are per patient and fiance's report.  Issued a rocker knife to improve independence with self feeding tasks and patient demonstrated use of RUE as stabilizer during self feeding and grooming.  Ambulated to nursing station with RW and right walker hand splint.  Patient required mod-max verbal cues and facilitation to limit right knee hyperextension and RLE foot placement with each step. Neuromuscular Facilitation: RUE PROM, AAROM, AROM with most active return occuring in shoulder and elbow, no wrist finger or thumb movement at this time.  RUE forced use as stabilizer and as active  assist and weight bearing during standing/squating tasks.  Forced use of RLE during sit><stands and during standing and weight shifts to right Right;Upper Extremity;Lower Extremity;Forced use;Activity to increase motor control;Activity to increase sustained activation;Activity to increase lateral weight shifting;Limitations Neuromuscular Facilitation Limitations: Patient reports premorbid LBP during activity Manual Therapy: Scapular mobilization Scapular Mobilization: Full ROM passively without pain Weight Bearing Technique: Yes RUE Weight Bearing Technique: Extended arm standing Response to Weight Bearing Technique: Tolerated well  2)  Patient and fiance sleeping in patient's bed upon arrival.  Patient donned shoes to include AFO on RLE with supervision.  Patient may need elastic laces to don AFO when it arrives before discharge.  Engaged in tub bench transfers, ambulate short distances, standing at sink and use of RUE as stabilizer.  Patient requires increased encouragement when task is difficult.  He often gives up before he has reached his potential to complete the task.    See FIM for current functional status  Therapy/Group: Individual Therapy  Haneefah Venturini 09/01/2012, 10:22 AM

## 2012-09-01 NOTE — Progress Notes (Signed)
Physical Therapy Note  Patient Details  Name: Richard [name] MRN: [id number] Date of Birth: October 30, 1955 Today's Date: 09/01/2012  1045-1140 (55 minutes) individual Pain : intermittent c/o pain RT medial knee in stance Focus of treatment: gait training with HW, up/down steps including caregiver ed on steps Treatment: Orthotist present during therapy for initial assessment . Recommended Blue Rocker with cork wedge to decrease RT knee hyperextension in stance. Gait 80 feet X 2  Using HW min assist + blue rocker with wedge - minimal knee hyperextension on right , good foot placement  and no c/o knee pain ; up/down step RT LE X 20 for RT quad strengthening /control ; spouse instructed in and redemonstrated up/down 2-3 steps with one rail min assist . Pt required vcs for sequencing LEs on steps.    Roisin Mones,JIM 09/01/2012, 7:27 AM

## 2012-09-01 NOTE — Patient Care Conference (Signed)
Inpatient RehabilitationTeam Conference Note Date: 09/01/2012   Time: 11:30 AM    Patient Name: Richard [name]      Medical Record Number: [id number]  Date of Birth: 03/19/1956 Sex: Male         Room/Bed: 4147/4147-01 Payor Info: Payor: [employer]  Plan: [employer]  Product Type: *No Product type*     Admitting Diagnosis: LT [place] CVA  Admit Date/Time:  08/25/2012  2:59 PM Admission Comments: No comment available   Primary Diagnosis:  Stroke Principal Problem: Stroke  Patient Active Problem List   Diagnosis Date Noted  . Stroke 08/26/2012  . Hyperlipidemia 08/25/2012  . Stage 3 chronic renal impairment associated with type 2 diabetes mellitus 08/23/2012  . Tobacco abuse 08/23/2012  . Protein-calorie malnutrition, mild 08/23/2012  . Type 2 diabetes mellitus 08/23/2012  . 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 Hypertensive emergency 05/21/2012    Expected Discharge Date: Expected Discharge Date: 09/04/12  Team Members Present: Physician: Dr. Alysia Penna Social Worker Present: [name], LCSW Nurse Present: Dorien Chihuahua, RN PT Present: Raylene Everts, PT;Other (comment);Georjean Mode, PT (Bridget ripa-PT) OT Present: Antony Salmon, Ellen Henri, OT SLP Present: Gunnar Fusi, SLP Other (Discipline and Name): Jake Michaelis Coordinator     Current Status/Progress Goal Weekly Team Focus  Medical   insomnia, R sided sensory dysesthesia, still smoking  smoke free environment  med adjustments   Bowel/Bladder   Continent of bowel and bladder. LBM 12-9  Remain continent of bowel and bladder.  Monitor.   Swallow/Nutrition/ Hydration             ADL's   Overall Supervision- Min and Mod I for toilet transfers and toileting at a w/c level using grab bars  Overall Mod I-Supervision  Activity tolerance, lateral weight shifts onto RLE with all sit><stands and standing during BADL tasks, RUE forced use, weight  bearing, edema management & patient managing his RUE.  Patient & caregiver education   Mobility   supervision transfers, w/c mobility, min-mod A gait, stairs  mod I overall except min A stairs  BIONESS, AFO evaluation, gait and stairs, D/C planning   Communication             Safety/Cognition/ Behavioral Observations            Pain   n/a  Pain level less than 3 on a scale of 0-10  Monitor any onset of pain.   Skin   Eczema at the back  No new skin breakdown or infection.  Assess any new skin breakdown or infection.      *See Interdisciplinary Assessment and Plan and progress notes for long and short-term goals  Barriers to Discharge: Depression and insomnia    Possible Resolutions to Barriers:  on TCA    Discharge Planning/Teaching Needs:  Pt decision on going with finance or back to his 2nd level-14 steps apt.  Aware safety issue, [facility] to first floor. Pt is stubborn and does what he wants, non-compliant with meds.      Team Discussion:  Pt going out on grounds pass and smoking.  MD made pass only with staff now. Wants to see neuro-psych order written for coping.  Pt wants to do well but does what he wants also. Education with finance-Mindy  Revisions to Treatment Plan:  None   Continued Need for Acute Rehabilitation Level of Care: The patient requires daily medical management by a physician with  specialized training in physical medicine and rehabilitation for the following conditions: Daily direction of a multidisciplinary physical rehabilitation program to ensure safe treatment while eliciting the highest outcome that is of practical value to the patient.: Yes Daily medical management of patient stability for increased activity during participation in an intensive rehabilitation regime.: Yes Daily analysis of laboratory values and/or radiology reports with any subsequent need for medication adjustment of medical intervention for :  Neurological problems  Laaibah Wartman, Gardiner Rhyme 09/01/2012, 1:59 PM

## 2012-09-01 NOTE — Progress Notes (Signed)
Patient ID: Richard [name], male   DOB: 04/24/1956, 56 y.o.   MRN: [id number] 57 y.o. 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 Admitted 08/23/2012 with right-sided weakness. A CT scan showed no acute intracranial abnormalities and remote lacunar infarct. Neurology services has been consulted with review of most recent echocardiogram completed during workup of stroke in August of 2013 showed ejection fraction of 123456 grade 2 diastolic dysfunction and carotid Doppler showed no ICA stenosis.. Close monitoring of blood pressure 185/110 had been placed on nicardipine. Urine drug drug screen was positive for marijuana. Neurology services consulted suspect left brain subcortical infarct felt to be thrombotic secondary small vessel disease questionable medical noncompliance. Presently patient is maintained on Plavix therapy  Subjective/Complaints:  parasthesia in RUE and RLE improved last noc but still not sleeping well  Objective: Vital Signs: Blood pressure 159/78, pulse 68, temperature 98.1 F (36.7 C), temperature source Oral, resp. rate 20, height 6' (1.829 m), weight 77.5 kg (170 lb 13.7 oz), SpO2 98.00%. No results found. Results for orders placed during the hospital encounter of 08/25/12 (from the past 72 hour(s))  GLUCOSE, CAPILLARY     Status: Abnormal   Collection Time   08/29/12 11:55 AM      Component Value Range Comment   Glucose-Capillary 139 (*) 70 - 99 mg/dL    Comment 1 Notify RN     GLUCOSE, CAPILLARY     Status: Abnormal   Collection Time   08/29/12  5:02 PM      Component Value Range Comment   Glucose-Capillary 176 (*) 70 - 99 mg/dL    Comment 1 Notify RN     GLUCOSE, CAPILLARY     Status: Abnormal   Collection Time   08/29/12  8:09 PM      Component Value Range Comment   Glucose-Capillary 196 (*) 70 - 99 mg/dL     Comment 1 Notify RN     BASIC METABOLIC PANEL     Status: Abnormal   Collection Time   08/30/12  7:15 AM      Component Value Range Comment   Sodium 138  135 - 145 mEq/L    Potassium 4.1  3.5 - 5.1 mEq/L    Chloride 102  96 - 112 mEq/L    CO2 27  19 - 32 mEq/L    Glucose, Bld 133 (*) 70 - 99 mg/dL    BUN 28 (*) 6 - 23 mg/dL    Creatinine, Ser 2.05 (*) 0.50 - 1.35 mg/dL    Calcium 9.3  8.4 - 10.5 mg/dL    GFR calc non Af Amer 35 (*) >90 mL/min    GFR calc Af Amer 40 (*) >90 mL/min   GLUCOSE, CAPILLARY     Status: Abnormal   Collection Time   08/30/12  7:19 AM      Component Value Range Comment   Glucose-Capillary 119 (*) 70 - 99 mg/dL    Comment 1 Notify RN     GLUCOSE, CAPILLARY     Status: Abnormal   Collection Time   08/30/12 12:04 PM      Component Value Range Comment   Glucose-Capillary 168 (*) 70 - 99 mg/dL    Comment 1 Notify RN     GLUCOSE, CAPILLARY     Status: Abnormal   Collection Time   08/30/12  3:53 PM  Component Value Range Comment   Glucose-Capillary 157 (*) 70 - 99 mg/dL   GLUCOSE, CAPILLARY     Status: Abnormal   Collection Time   08/30/12  8:15 PM      Component Value Range Comment   Glucose-Capillary 188 (*) 70 - 99 mg/dL    Comment 1 Notify RN     GLUCOSE, CAPILLARY     Status: Abnormal   Collection Time   08/31/12  7:40 AM      Component Value Range Comment   Glucose-Capillary 152 (*) 70 - 99 mg/dL    Comment 1 Notify RN     GLUCOSE, CAPILLARY     Status: Abnormal   Collection Time   08/31/12 11:19 AM      Component Value Range Comment   Glucose-Capillary 139 (*) 70 - 99 mg/dL    Comment 1 Notify RN     GLUCOSE, CAPILLARY     Status: Abnormal   Collection Time   08/31/12  4:30 PM      Component Value Range Comment   Glucose-Capillary 119 (*) 70 - 99 mg/dL   GLUCOSE, CAPILLARY     Status: Abnormal   Collection Time   08/31/12  8:52 PM      Component Value Range Comment   Glucose-Capillary 201 (*) 70 - 99 mg/dL    Comment 1 Notify RN      GLUCOSE, CAPILLARY     Status: Abnormal   Collection Time   09/01/12  7:08 AM      Component Value Range Comment   Glucose-Capillary 125 (*) 70 - 99 mg/dL    Comment 1 Notify RN         Vitals reviewed.  Constitutional: He is oriented to person, place, and time.  HENT:  Head: Normocephalic.  Eyes:  Pupils round and reactive to light  Neck: Normal range of motion. Neck supple. No thyromegaly present.  Cardiovascular: Normal rate and regular rhythm.  Pulmonary/Chest: Effort normal and breath sounds normal. No respiratory distress.  Abdominal: Soft. Bowel sounds are normal. There is no tenderness.  Neurological: He is alert and oriented to person, place, and time.  Follow simple commands. Dense right HP. Does display 1 to 1+ strength in right deltoid, bicep in flexor synergy pattern. Distal RUE is 0.5. Right HF is 1/5 and again distally is 0/5. DTR's are 2+ to 3+ on the right. Senses pain in the right arm, leg. No facial droop or tongue deviation. Cognitively appropriate. Speech is clear.  Skin: Skin is warm and dry.  Psychiatric: He has a normal mood and affect.   Assessment/Plan: 1. Functional deficits secondary to Left corona radiata infarct which require 3+ hours per day of interdisciplinary therapy in a comprehensive inpatient rehab setting. Physiatrist is providing close team supervision and 24 hour management of active medical problems listed below. Physiatrist and rehab team continue to assess barriers to discharge/monitor patient progress toward functional and medical goals. FIM: FIM - Bathing Bathing Steps Patient Completed: Chest;Right Arm;Abdomen;Front perineal area;Right upper leg;Left upper leg;Right lower leg (including foot);Left lower leg (including foot);Buttocks Bathing: 4: Min-Patient completes 8-9 57f 10 parts or 75+ percent  FIM - Upper Body Dressing/Undressing Upper body dressing/undressing steps patient completed: Thread/unthread right sleeve of pullover  shirt/dresss;Thread/unthread left sleeve of pullover shirt/dress;Put head through opening of pull over shirt/dress;Pull shirt over trunk Upper body dressing/undressing: 5: Set-up assist to: Obtain clothing/put away FIM - Lower Body Dressing/Undressing Lower body dressing/undressing steps patient completed: Thread/unthread right underwear leg;Thread/unthread  left underwear leg;Pull underwear up/down;Thread/unthread right pants leg;Thread/unthread left pants leg;Pull pants up/down;Don/Doff right sock;Don/Doff left sock;Don/Doff right shoe;Don/Doff left shoe Lower body dressing/undressing: 4: Min-Patient completed 75 plus % of tasks  FIM - Toileting Toileting steps completed by patient: Adjust clothing prior to toileting;Performs perineal hygiene;Adjust clothing after toileting Toileting Assistive Devices: Grab bar or rail for support Toileting: 6: More than reasonable amount of time  FIM - Radio producer Devices: Grab bars Toilet Transfers: 4-To toilet/BSC: Min A (steadying Pt. > 75%);4-From toilet/BSC: Min A (steadying Pt. > 75%)  FIM - Bed/Chair Transfer Bed/Chair Transfer Assistive Devices: Arm rests;Walker Bed/Chair Transfer: 4: Bed > Chair or W/C: Min A (steadying Pt. > 75%);4: Chair or W/C > Bed: Min A (steadying Pt. > 75%)  FIM - Locomotion: Wheelchair Distance: 150 Locomotion: Wheelchair: 5: Travels 150 ft or more: maneuvers on rugs and over door sills with supervision, cueing or coaxing FIM - Locomotion: Ambulation Locomotion: Ambulation Assistive Devices: Walker - Hemi;Orthosis Ambulation/Gait Assistance: 3: Mod assist Locomotion: Ambulation: 2: Travels 50 - 149 ft with moderate assistance (Pt: 50 - 74%)  Comprehension Comprehension Mode: Auditory Comprehension: 6-Follows complex conversation/direction: With extra time/assistive device  Expression Expression Mode: Verbal Expression: 6-Expresses complex ideas: With extra time/assistive  device  Social Interaction Social Interaction Mode: Asleep Social Interaction: 6-Interacts appropriately with others with medication or extra time (anti-anxiety, antidepressant).  Problem Solving Problem Solving Mode: Asleep Problem Solving: 6-Solves complex problems: With extra time  Memory Memory Mode: Asleep Memory: 6-More than reasonable amt of time  Medical Problem List and Plan:  1. 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 DVT Prophylaxis/Anticoagulation: SCDs. Monitor for any signs of DVT  3. Neuropsych: This patient is capable of making decisions on his/her own behalf.  -will need some help with coping skills  -team to provide egosupport as needed  4. Hypertension.Uncontrolled, Increase Hydralazine 25mg  3 times a day, labetalol 200 mg 3 times a day. Monitor with increased activity  5. Diabetes mellitus.Uncontrolled but improving Hemoglobin A1c 7.5. Presently on sliding scale insulin. Check blood sugars a.c. and at bedtime.  Patient on Glucophage 1000 mg twice daily prior to admission and will resume as tolerated Currently creatinine is elevated 6. Hyperlipidemia. Zocor  7. Tobacco abuse/marijuana. NicoDerm patch and provide drug Counseling Discussed no smoking policy.  May have indoors pass with staff  8. Chronic renal insufficiency with baseline creatinine 2.10. Latest creatinine 2.07 08/24/2012 a followup chemistries  08/25/2012   LOS (Days) 7 A FACE TO FACE EVALUATION WAS PERFORMED  KIRSTEINS,ANDREW E 09/01/2012, 9:29 AM

## 2012-09-02 ENCOUNTER — Encounter (HOSPITAL_COMMUNITY): Payer: Non-veteran care

## 2012-09-02 ENCOUNTER — Inpatient Hospital Stay (HOSPITAL_COMMUNITY): Payer: Non-veteran care | Admitting: Occupational Therapy

## 2012-09-02 ENCOUNTER — Inpatient Hospital Stay (HOSPITAL_COMMUNITY): Payer: Non-veteran care | Admitting: *Deleted

## 2012-09-02 DIAGNOSIS — I6350 Cerebral infarction due to unspecified occlusion or stenosis of unspecified cerebral artery: Secondary | ICD-10-CM

## 2012-09-02 DIAGNOSIS — I6330 Cerebral infarction due to thrombosis of unspecified cerebral artery: Secondary | ICD-10-CM

## 2012-09-02 DIAGNOSIS — G8110 Spastic hemiplegia affecting unspecified side: Secondary | ICD-10-CM

## 2012-09-02 LAB — GLUCOSE, CAPILLARY
Glucose-Capillary: 226 mg/dL — ABNORMAL HIGH (ref 70–99)
Glucose-Capillary: 169 mg/dL — ABNORMAL HIGH (ref 70–99)

## 2012-09-02 MED ORDER — NICOTINE 14 MG/24HR TD PT24
14.0000 mg | MEDICATED_PATCH | Freq: Every day | TRANSDERMAL | Status: DC
  Filled 2012-09-02 (×2): qty 1

## 2012-09-02 MED ORDER — TEMAZEPAM 7.5 MG PO CAPS: 7.5000 mg | ORAL_CAPSULE | Freq: Every evening | ORAL | Status: DC | PRN

## 2012-09-02 NOTE — Progress Notes (Signed)
Patient ID: Richard [name], male   DOB: [birth year], 56 y.o.   MRN: [id number] 56 y.o. 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 Admitted 08/23/2012 with right-sided weakness. A CT scan showed no acute intracranial abnormalities and remote lacunar infarct. Neurology services has been consulted with review of most recent echocardiogram completed during workup of stroke in August of 2013 showed ejection fraction of 123456 grade 2 diastolic dysfunction and carotid Doppler showed no ICA stenosis.. Close monitoring of blood pressure 185/110 had been placed on nicardipine. Urine drug drug screen was positive for marijuana. Neurology services consulted suspect left brain subcortical infarct felt to be thrombotic secondary small vessel disease questionable medical noncompliance. Presently patient is maintained on Plavix therapy  Subjective/Complaints:  parasthesia in RUE and RLE improved last noc but still not sleeping well  Objective: Vital Signs: Blood pressure 135/62, pulse 72, temperature 97.7 F (36.5 C), temperature source Oral, resp. rate 20, height 6' (1.829 m), weight 83.7 kg (184 lb 8.4 oz), SpO2 99.00%. No results found. Results for orders placed during the hospital encounter of 08/25/12 (from the past 72 hour(s))  GLUCOSE, CAPILLARY     Status: Abnormal   Collection Time   08/30/12  8:15 PM      Component Value Range Comment   Glucose-Capillary 188 (*) 70 - 99 mg/dL    Comment 1 Notify RN     GLUCOSE, CAPILLARY     Status: Abnormal   Collection Time   08/31/12  7:40 AM      Component Value Range Comment   Glucose-Capillary 152 (*) 70 - 99 mg/dL    Comment 1 Notify RN     GLUCOSE, CAPILLARY     Status: Abnormal   Collection Time   08/31/12 11:19 AM      Component Value Range Comment   Glucose-Capillary 139 (*) 70 - 99 mg/dL     Comment 1 Notify RN     GLUCOSE, CAPILLARY     Status: Abnormal   Collection Time   08/31/12  4:30 PM      Component Value Range Comment   Glucose-Capillary 119 (*) 70 - 99 mg/dL   GLUCOSE, CAPILLARY     Status: Abnormal   Collection Time   08/31/12  8:52 PM      Component Value Range Comment   Glucose-Capillary 201 (*) 70 - 99 mg/dL    Comment 1 Notify RN     GLUCOSE, CAPILLARY     Status: Abnormal   Collection Time   09/01/12  7:08 AM      Component Value Range Comment   Glucose-Capillary 125 (*) 70 - 99 mg/dL    Comment 1 Notify RN     GLUCOSE, CAPILLARY     Status: Abnormal   Collection Time   09/01/12 11:36 AM      Component Value Range Comment   Glucose-Capillary 158 (*) 70 - 99 mg/dL    Comment 1 Notify RN     GLUCOSE, CAPILLARY     Status: Abnormal   Collection Time   09/01/12  4:40 PM      Component Value Range Comment   Glucose-Capillary 135 (*) 70 - 99 mg/dL    Comment 1 Notify RN     GLUCOSE, CAPILLARY     Status: Abnormal   Collection Time   09/01/12  8:49 PM  Component Value Range Comment   Glucose-Capillary 154 (*) 70 - 99 mg/dL    Comment 1 Notify RN     GLUCOSE, CAPILLARY     Status: Abnormal   Collection Time   09/02/12 11:48 AM      Component Value Range Comment   Glucose-Capillary 226 (*) 70 - 99 mg/dL    Comment 1 Notify RN     GLUCOSE, CAPILLARY     Status: Abnormal   Collection Time   09/02/12  4:28 PM      Component Value Range Comment   Glucose-Capillary 143 (*) 70 - 99 mg/dL    Comment 1 Notify RN         Vitals reviewed.  Constitutional: He is oriented to person, place, and time.  HENT:  Head: Normocephalic.  Eyes:  Pupils round and reactive to light  Neck: Normal range of motion. Neck supple. No thyromegaly present.  Cardiovascular: Normal rate and regular rhythm.  Pulmonary/Chest: Effort normal and breath sounds normal. No respiratory distress.  Abdominal: Soft. Bowel sounds are normal. There is no tenderness.   Neurological: He is alert and oriented to person, place, and time.  Follow simple commands. Dense right HP. Does display 1 to 1+ strength in right deltoid, bicep in flexor synergy pattern. Distal RUE is 0.5. Right HF is 1/5, knee ext 2-/5 distally is 0/5. DTR's are 2+ to 3+ on the right. Senses pain in the right arm, leg. No facial droop or tongue deviation. Cognitively appropriate. Speech is clear.  Skin: Skin is warm and dry.  Psychiatric: He has a normal mood and affect.   Assessment/Plan: 1. Functional deficits secondary to Left corona radiata infarct which require 3+ hours per day of interdisciplinary therapy in a comprehensive inpatient rehab setting. Physiatrist is providing close team supervision and 24 hour management of active medical problems listed below. Physiatrist and rehab team continue to assess barriers to discharge/monitor patient progress toward functional and medical goals. FIM: FIM - Bathing Bathing Steps Patient Completed: Chest;Right Arm;Abdomen;Front perineal area;Right upper leg;Left upper leg;Right lower leg (including foot);Left lower leg (including foot);Buttocks Bathing: 4: Min-Patient completes 8-9 68f 10 parts or 75+ percent  FIM - Upper Body Dressing/Undressing Upper body dressing/undressing steps patient completed: Thread/unthread right sleeve of pullover shirt/dresss;Thread/unthread left sleeve of pullover shirt/dress;Put head through opening of pull over shirt/dress;Pull shirt over trunk Upper body dressing/undressing: 5: Set-up assist to: Obtain clothing/put away FIM - Lower Body Dressing/Undressing Lower body dressing/undressing steps patient completed: Don/Doff right shoe;Don/Doff left shoe;Fasten/unfasten right shoe;Fasten/unfasten left shoe Lower body dressing/undressing: 5: Supervision: Safety issues/verbal cues  FIM - Toileting Toileting steps completed by patient: Adjust clothing prior to toileting;Performs perineal hygiene;Adjust clothing after  toileting Toileting Assistive Devices: Grab bar or rail for support Toileting: 6: More than reasonable amount of time  FIM - Radio producer Devices: Grab bars Toilet Transfers: 4-To toilet/BSC: Min A (steadying Pt. > 75%);4-From toilet/BSC: Min A (steadying Pt. > 75%)  FIM - Bed/Chair Transfer Bed/Chair Transfer Assistive Devices: Arm rests;Walker Bed/Chair Transfer: 5: Chair or W/C > Bed: Supervision (verbal cues/safety issues);4: Bed > Chair or W/C: Min A (steadying Pt. > 75%)  FIM - Locomotion: Wheelchair Distance: 150 Locomotion: Wheelchair: 5: Travels 150 ft or more: maneuvers on rugs and over door sills with supervision, cueing or coaxing FIM - Locomotion: Ambulation Locomotion: Ambulation Assistive Devices: Walker - Hemi;Orthosis Ambulation/Gait Assistance: 4: Min assist;Other (comment) (R AFO) Locomotion: Ambulation: 2: Travels 50 - 149 ft with minimal assistance (Pt.>75%)  Comprehension Comprehension Mode: Auditory Comprehension: 7-Follows complex conversation/direction: With no assist  Expression Expression Mode: Verbal Expression: 7-Expresses complex ideas: With no assist  Social Interaction Social Interaction Mode: Asleep Social Interaction: 7-Interacts appropriately with others - No medications needed.  Problem Solving Problem Solving Mode: Asleep Problem Solving: 7-Solves complex problems: Recognizes & self-corrects  Memory Memory Mode: Asleep Memory: 6-More than reasonable amt of time  Medical Problem List and Plan:  1. 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 DVT Prophylaxis/Anticoagulation: SCDs. Monitor for any signs of DVT  3. Neuropsych: This patient is capable of making decisions on his/her own behalf.  -will need some help with coping skills  -team to provide egosupport as needed  4. Hypertension.Uncontrolled, Increase Hydralazine 25mg  3 times a day, labetalol 200 mg 3 times a day. Monitor with  increased activity  5. Diabetes mellitus.Uncontrolled but improving Hemoglobin A1c 7.5. Presently on sliding scale insulin. Check blood sugars a.c. and at bedtime.  Patient on Glucophage 1000 mg twice daily prior to admission and will resume as tolerated Currently creatinine is elevated 6. Hyperlipidemia. Zocor  7. Tobacco abuse/marijuana. NicoDerm patch and provide drug Counseling Discussed no smoking policy.  May have indoors pass with staff, plans to use e cigs as outp 8. Chronic renal insufficiency with baseline creatinine 2.10. Latest creatinine 2.07 08/24/2012 a followup chemistries  08/25/2012   LOS (Days) 8 A FACE TO FACE EVALUATION WAS PERFORMED  KIRSTEINS,ANDREW E 09/02/2012, 5:18 PM

## 2012-09-02 NOTE — Progress Notes (Signed)
Physical Therapy Session Note  Patient Details  Name: Richard [name] MRN: [id number] Date of Birth: [birth year]  Today's Date: 09/02/2012 Time: 13:05-14:05 (57min)   Short Term Goals: Week 1:  PT Short Term Goal 1 (Week 1): Pt will perform functional transfers with min A PT Short Term Goal 2 (Week 1): Pt will propel w/c in home environment with supervision 25' PT Short Term Goal 3 (Week 1): Pt will gait with min A 25' in controlled environment  Skilled Therapeutic Interventions/Progress Updates:  Tx focused on car transfers, stairs, gait with hemi-walker, and NMR for RLE strength and activation.   Pt propelled WC x150' with S in controlled environment and x50' in home environment, around obstacles, able to manage parts. Pt needed cues to push leg rests to the side completely prior to transfers for safety .  Instructed pt in car transfer to simulated height for high SUV. Running board simulation available, but pt not needing due to height. Performed from [facility] level and approaching with hemi-walker x1 each condition with therapist and x1 each with fiance for education/training. Pt needing min A for safety with turns.   Ascended/descended full flight of stairs 2x12 with L rail ascending and min A only for steadying. Pt ascended forwards and descended backwards, step-to pattern. Fiance performed second trial with good technique, able to return demonstrate with good positioning and timing with her movement.   Gait training on carpet 2x80' with hemi-walker and Min A overall. Pt with large step length and AD placement, advised to shorted if able, pending brace. Got pt higher hemi-walker, with improved posture. Pt had 1 LOB during turn, but able to recover without assistance. Distance limited by fatigue, and pt requiring seated rest between trials.      Therapy Documentation Precautions:  Precautions Precautions: Fall Precaution Comments: right hemiparesis Restrictions Weight Bearing  Restrictions: No   Pain: No complaints     Locomotion : Ambulation Ambulation/Gait Assistance: 4: Min assist Wheelchair Mobility Distance: 150   See FIM for current functional status  Therapy/Group: Individual Therapy  Richard [name], PT, DPT   Richard [name], [place] 09/02/2012, 1:41 PM

## 2012-09-02 NOTE — Progress Notes (Signed)
Physical Therapy Session Note  Patient Details  Name: Richard [name] MRN: [id number] Date of Birth: Aug 12, 1956  Today's Date: 09/02/2012 Time: [id number] Time Calculation (min): 30 min  Short Term Goals: Week 2:   STG=LTG  Skilled Therapeutic Interventions/Progress Updates:    Today's session focused on increasing endurance with gait training. Patient had received a lot of therapy today and reports he "is worn out." Focus of session was on endurance and strategies for energy conservation and management of fatigue upon discharge. Discussion with patient and patient's fiance about energy conservation techniques, things to be aware of when ambulating with increased fatigue, falls risk, etc. Patient and fiance verbalized understanding and have no further questions at this time.  Therapy Documentation Precautions:  Precautions Precautions: Fall Precaution Comments: right hemiparesis Restrictions Weight Bearing Restrictions: No Pain: Pain Assessment Pain Assessment: No/denies pain Mobility: Transfers Sit to Stand: 4: Min assist;With armrests;With upper extremity assist Sit to Stand Details: Tactile cues for initiation;Verbal cues for precautions/safety;Tactile cues for weight shifting;Manual facilitation for weight shifting Stand to Sit: 4: Min assist;With upper extremity assist;With armrests Stand to Sit Details (indicate cue type and reason): Tactile cues for weight shifting;Verbal cues for precautions/safety;Manual facilitation for weight shifting Stand Pivot Transfers: 4: Min assist;With armrests Stand Pivot Transfer Details: Tactile cues for weight shifting;Verbal cues for precautions/safety;Manual facilitation for weight shifting Locomotion : Ambulation Ambulation: Yes Ambulation/Gait Assistance: 4: Min assist;Other (comment) (R AFO) Ambulation Distance (Feet): 100 Feet Assistive device: Hemi-walker Ambulation/Gait Assistance Details: Verbal cues for safe use of DME/AE;Verbal  cues for precautions/safety;Manual facilitation for weight shifting;Verbal cues for gait pattern;Visual cues/gestures for precautions/safety Ambulation/Gait Assistance Details: Patient instructed in gait training 100' x2 with hemi walker and R AFO and min assist. Verbal cues for proper advancement and placement of R foot Gait Gait: Yes Gait Pattern: Impaired Gait Pattern: Step-to pattern;Step-through pattern;Decreased step length - right;Decreased stride length;Decreased stance time - right;Narrow base of support;Trunk flexed Wheelchair Mobility Wheelchair Mobility: Yes Wheelchair Assistance: 6: Modified independent (Device/Increase time) Wheelchair Propulsion: Left upper extremity;Left lower extremity Wheelchair Parts Management: Needs assistance Distance: 150   See FIM for current functional status  Therapy/Group: Individual Therapy  Lillia Abed. Bonnie Overdorf, PT, DPT  09/02/2012, 4:15 PM

## 2012-09-02 NOTE — Progress Notes (Signed)
Social Work Patient ID: Richard [name], male   DOB: [birth year], 56 y.o.   MRN: [id number] Met with pt to inform of team conference goals-mod/i-supervision w/c level and discharge 12/14. Pt is ready to go home, he is tired of being in a hospital.  His finance-Mindy has been here and learned his care. Gave him the letter for assistance with his light bill.  Pt continues to want to smoke but aware of the rules here. Prepare for discharge Sat.  Pt requires a lightweight wheelchair to be able to self propel and uses for his self care.  He is  Unable to self propel a standard wheelchair.  He is independent from a wheelchair level, needs physical assistance otherwise.

## 2012-09-02 NOTE — Progress Notes (Addendum)
Occupational Therapy Session Note  Patient Details  Name: Veronica Gilhooley MRN: [id number] Date of Birth: 05-31-1956  Today's Date: 09/02/2012 Time: 0903-1000 Time Calculation (min): 57 min   Second Session: Time:  11:02-11:45 Time Calculation (min): 43 min  Short Term Goals: Week 1:  OT Short Term Goal 1 (Week 1): Bath: Supervision with shower to include sit and stand OT Short Term Goal 2 (Week 1): UB dressing:  Min assist with pull over shirt OT Short Term Goal 3 (Week 1): LB Dressing:  Min assist to include socks and shoes OT Short Term Goal 4 (Week 1): Toilet transfer: Min assist OT Short Term Goal 5 (Week 1): RUE Management:  No more than 2 VCs regarding management of RUE during all BADL and Functional Mobility.  Skilled Therapeutic Interventions/Progress Updates:    First session worked on Passenger transport manager re-ed for the RUE with incorporating the bioness H200 electrical stimulation.  Utilized e-stim to help facilitate digit flexion and extension in the right hand.  E-stim performed in exercise mode as well as open exercise mode with cueing for pt to attempt opening and closing the hand to correspond with the stimulation.  Also incorporated shoulder flexion and elbow extension using towel on a slanted board while stimulation was active, to mimic functional reach.  Pt able to elicit active elbow extension and shoulder flexion in synergy pattern.  Tends to use his whole body to overcompensate and perform the movement.  Also worked on internal and external rotation on the board with activation of digit extension only.  Second Session:  Also worked on Brewing technologist in closed chain quadriped weightbearing.  Had pt focus on attempting to maintain right elbow extension while sliding the left hand forward on the mat.  Pt needing mod facilitation to maintain elbow extension during task.  Progressed to having him also use the left hand to pick up and place large pegs in pegboard while  supporting his bodyweight with the right hand.  Had pt also maintain tall kneeling with min assist when resting his arm.  Finished by using the UE ergonometer and ace wrapping the right hand onto the handle.  Performed 3 mins forward with level 1 resistance.  One minute with both UEs then 1 minute with the right UE and mod facilitation, and back to 1 minute with both UEs.  Repeated this process again but peddling in reverse for second set.    Therapy Documentation Precautions:  Precautions Precautions: Fall Precaution Comments: right hemiparesis Restrictions Weight Bearing Restrictions: No  Pain: Pain Assessment Pain Assessment: No/denies pain ADL: See FIM for current functional status  Therapy/Group: Individual Therapy  Malvina Schadler OTR/L 09/02/2012, 12:14 PM

## 2012-09-02 NOTE — Progress Notes (Signed)
Orthopedic Tech Progress Note Patient Details:  Richard [name] [birth year] [id number] Braced order fitted by Advanced vendor Brooke Pace at 3:30pm. Patient ID: Richard [name], male   DOB: 01-21-1956, 56 y.o.   MRN: [id number]   Richard [name] 09/02/2012, 3:48 PM

## 2012-09-03 ENCOUNTER — Inpatient Hospital Stay (HOSPITAL_COMMUNITY): Payer: Non-veteran care | Admitting: Occupational Therapy

## 2012-09-03 ENCOUNTER — Inpatient Hospital Stay (HOSPITAL_COMMUNITY): Payer: Non-veteran care | Admitting: Physical Therapy

## 2012-09-03 ENCOUNTER — Inpatient Hospital Stay (HOSPITAL_COMMUNITY): Payer: Non-veteran care | Admitting: *Deleted

## 2012-09-03 LAB — GLUCOSE, CAPILLARY: Glucose-Capillary: 100 mg/dL — ABNORMAL HIGH (ref 70–99)

## 2012-09-03 MED ORDER — GLIPIZIDE ER 5 MG PO TB24: 5.0000 mg | ORAL_TABLET | Freq: Every day | ORAL | Status: DC

## 2012-09-03 MED ORDER — NICOTINE 14 MG/24HR TD PT24: 1.0000 | MEDICATED_PATCH | Freq: Every day | TRANSDERMAL | Status: DC

## 2012-09-03 MED ORDER — AMLODIPINE BESYLATE 5 MG PO TABS: 5.0000 mg | ORAL_TABLET | Freq: Every day | ORAL | Status: DC

## 2012-09-03 MED ORDER — LABETALOL HCL 200 MG PO TABS: 200.0000 mg | ORAL_TABLET | Freq: Three times a day (TID) | ORAL | Status: DC

## 2012-09-03 MED ORDER — LABETALOL HCL 200 MG PO TABS
200.0000 mg | ORAL_TABLET | Freq: Three times a day (TID) | ORAL | Status: DC
  Filled 2012-09-03 (×2): qty 1

## 2012-09-03 MED ORDER — NORTRIPTYLINE HCL 25 MG PO CAPS: 25.0000 mg | ORAL_CAPSULE | Freq: Every day | ORAL | Status: DC

## 2012-09-03 MED ORDER — GLIPIZIDE ER 5 MG PO TB24
5.0000 mg | ORAL_TABLET | Freq: Every day | ORAL | Status: DC
  Filled 2012-09-03: qty 1

## 2012-09-03 MED ORDER — PRAVASTATIN SODIUM 40 MG PO TABS: 80.0000 mg | ORAL_TABLET | Freq: Every day | ORAL | Status: DC

## 2012-09-03 NOTE — Progress Notes (Signed)
Physical Therapy Discharge Summary  Patient Details  Name: Richard [name] MRN: [id number] Date of Birth: [birth year]  Today's Date: 09/03/2012 Time: 0905-1000 and 1352-1430 Time Calculation (min): 55 min and 38 min  Patient has met 9 of 9 long term goals due to improved activity tolerance, improved balance, improved postural control, increased strength, ability to compensate for deficits, functional use of  right upper extremity and right lower extremity and improved coordination.  Patient to discharge at an ambulatory level Supervision with hemi walker for household distances and use of w/c for longer community distances secondary to significant fatigue and c/o LBP or knee OA pain.   Patient's care partner is independent to provide the necessary physical and supervision assistance at discharge.  Reasons goals not met: All goals met  Recommendation:  Patient will benefit from ongoing skilled PT services in home health setting to continue to advance safe functional mobility, address ongoing impairments in R sided strength, motor control, timing/sequencing, pain in RUE and low back activity tolerance/edurance, dynamic standing balance, gait, and minimize fall risk.  Equipment: hemi walker, blue rocker AFO, w/c  Reasons for discharge: treatment goals met and discharge from hospital  Patient/family agrees with progress made and goals achieved: Yes  PT Discharge Precautions/Restrictions   Vital Signs   Pain Pain Assessment Pain Assessment: No/denies pain Vision/Perception     Cognition Orientation Level: Oriented X4 Sensation Sensation Light Touch: Appears Intact Stereognosis: Not tested Hot/Cold: Not tested Proprioception: Appears Intact Coordination Gross Motor Movements are Fluid and Coordinated: No Coordination and Movement Description: Limited by R hemiplegia weakness Heel Shin Test: Limited by R hemiplegia weakness Motor  Motor Motor: Hemiplegia;Motor  impersistence Motor - Discharge Observations: R sided hemiplegia  Mobility Bed Mobility Bed Mobility: Supine to Sit;Sit to Supine Supine to Sit: 6: Modified independent (Device/Increase time) Sit to Supine: 6: Modified independent (Device/Increase time) Transfers Stand Pivot Transfers: 6: Modified independent (Device/Increase time) Stand Pivot Transfer Details (indicate cue type and reason): Mod I with UE support on hemi walker and blue rocker AFO RLE for safety and foot clearance; also demonstrated mod I w/c parts management and stand pivot transfer w/c <> simulated tall SUV transfer with UE support on door. Locomotion  Ambulation Ambulation/Gait Assistance: 5: Supervision Assistive device: Hemi-walker;Other (Comment) (R blue rocker AFO) Stairs / Additional Locomotion Stairs: Yes Stairs Assistance: 4: Min assist Stairs Assistance Details (indicate cue type and reason): Demonstrated with fiance; negotiated full flight of stairs with L rail ascending forwards, descending backwards to allow LLE to lead when ascending and RLE when descending with fiance's min A for safety and cues for sequencing. Stair Management Technique: One rail Left;Step to pattern;Backwards;Forwards Number of Stairs: 12  Height of Stairs: 8  Product manager Assistance: 6: Modified independent (Device/Increase time) Wheelchair Propulsion: Left upper extremity;Left lower extremity Wheelchair Parts Management: Independent Distance: 150  Trunk/Postural Assessment  Cervical Assessment Cervical Assessment: Within Functional Limits Thoracic Assessment Thoracic Assessment: Within Functional Limits Lumbar Assessment Lumbar Assessment: Within Functional Limits  Balance Standardized Balance Assessment Standardized Balance Assessment: Berg Balance Test Berg Balance Test Sit to Stand: Able to stand  independently using hands Standing Unsupported: Able to stand safely 2 minutes Sitting with Back Unsupported  but Feet Supported on Floor or Stool: Able to sit safely and securely 2 minutes Stand to Sit: Sits safely with minimal use of hands Transfers: Able to transfer safely, minor use of hands Standing Unsupported with Eyes Closed: Able to stand 10 seconds safely Standing Ubsupported with Feet Together: Able  to place feet together independently and stand for 1 minute with supervision From Standing, Reach Forward with Outstretched Arm: Can reach forward >12 cm safely (5") From Standing Position, Pick up Object from Floor: Able to pick up shoe, needs supervision From Standing Position, Turn to Look Behind Over each Shoulder: Turn sideways only but maintains balance Turn 360 Degrees: Needs assistance while turning Standing Unsupported, Alternately Place Feet on Step/Stool: Able to complete >2 steps/needs minimal assist Standing Unsupported, One Foot in Front: Able to take small step independently and hold 30 seconds Standing on One Leg: Able to lift leg independently and hold equal to or more than 3 seconds Total Score: 39  Patient demonstrates increased fall risk as noted by score of 39/56 on Berg Balance Scale.  (<36= high risk for falls, close to 100%; 37-45 significant >80%; 46-51 moderate >50%; 52-55 lower >25%)  Following Berg balance assessment discussed with patient his falls risk and ways to minimize falls risk.  Demonstrated to patient how to perform floor > furniture transfer and indications for calling EMS.  Patient gave repeat demonstration of floor >furniture transfer with extra time, verbal cues for sequence and min-mod A to prevent LOB on R.  Fiance verbalized understanding. Extremity Assessment  RLE Assessment RLE Assessment: Exceptions to [facility] RLE Strength RLE Overall Strength: Deficits RLE Overall Strength Comments: 4/5 hip flexion, knee extension, 0/5 ankle DF, 2/5 knee flexion; poor functional muscular endurance/persistance LLE Assessment LLE Assessment: Within Functional  Limits   PM session: patient received equipment for home except hemi walker which will be delivered to home.  Performed transfer to toilet with hemi walker and fiance's supervision.  Performed gait in controlled environment x 150' + addition of higher level household challenges including: stepping over low obstacles, weaving L and R around obstacles, over compliant surface x 100' total with supervision, R blue rocker, and hemi walker with verbal cues for safe sequence.  Patient performed w/c mobility in new w/c after set up of leg rests x 150' with L hemi technique mod I.  Patient performed bilat UE and LE strengthening, ROM and endurance training on Nustep at level 6 resistance x 5 minute with bilat UE and LE + 3 min with LE only at level 5 resistance for extensor muscle strengthening.  Patient very fatigued at end of session.  See FIM for current functional status  Raylene Everts [facility] 09/03/2012, 10:02 AM

## 2012-09-03 NOTE — Progress Notes (Signed)
Social Work Discharge Note Discharge Note  The overall goal for the admission was met for:   Discharge location: [place]  Length of Stay: Yes-9 DAYS  Discharge activity level: Yes-MOD/I-W/C LEVEL AND SUPERVISION AMBULATION  Home/community participation: Yes  Services provided included: MD, RD, PT, OT, RN, TR, Pharmacy, Neuropsych and SW  Financial Services: Other: [employer]  Follow-up services arranged: Home Health: [facility]-PT,OT,RN,SW and DME: [facility]-WHEELCHAIR, HEMI-WALKER,TUB BENCH  Comments (or additional information):FAMILY EDUCATION COMPLETED WITH MINDY, HERE SEVERAL DAYS TO ATTEND THERAPIES WITH.  NOT INTERESTED IN SMOKING CESSATION RESOURCES.  PT AWARE OF THE RISKS OF NON-COMPLIANCE WITH MEDS AND CONTINUED SMOKING  Patient/Family verbalized understanding of follow-up arrangements: Yes  Individual responsible for coordination of the follow-up plan: SELF & MINDY-FINANCE  Confirmed correct DME delivered: [name] 09/03/2012    [name]

## 2012-09-03 NOTE — Progress Notes (Signed)
Pt discharge to home with significant other at 1700. Discharge instructions given by Algis Liming, PA with verbal understanding.  Answered all pt and family questions with full understanding. Belongings with pt and medications picked up from [place] and given back to pt with pt signature.

## 2012-09-03 NOTE — Progress Notes (Signed)
Patient ID: Richard [name], male   DOB: [birth year], 56 y.o.   MRN: [id number] 56 y.o. 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 Admitted 08/23/2012 with right-sided weakness. A CT scan showed no acute intracranial abnormalities and remote lacunar infarct. Neurology services has been consulted with review of most recent echocardiogram completed during workup of stroke in August of 2013 showed ejection fraction of 123456 grade 2 diastolic dysfunction and carotid Doppler showed no ICA stenosis.. Close monitoring of blood pressure 185/110 had been placed on nicardipine. Urine drug drug screen was positive for marijuana. Neurology services consulted suspect left brain subcortical infarct felt to be thrombotic secondary small vessel disease questionable medical noncompliance. Presently patient is maintained on Plavix therapy  Subjective/Complaints:  Slept better last noc Would like to go home after therapy today  Objective: Vital Signs: Blood pressure 145/81, pulse 66, temperature 97.7 F (36.5 C), temperature source Oral, resp. rate 20, height 6' (1.829 m), weight 83.7 kg (184 lb 8.4 oz), SpO2 99.00%. No results found. Results for orders placed during the hospital encounter of 08/25/12 (from the past 72 hour(s))  GLUCOSE, CAPILLARY     Status: Abnormal   Collection Time   08/31/12 11:19 AM      Component Value Range Comment   Glucose-Capillary 139 (*) 70 - 99 mg/dL    Comment 1 Notify RN     GLUCOSE, CAPILLARY     Status: Abnormal   Collection Time   08/31/12  4:30 PM      Component Value Range Comment   Glucose-Capillary 119 (*) 70 - 99 mg/dL   GLUCOSE, CAPILLARY     Status: Abnormal   Collection Time   08/31/12  8:52 PM      Component Value Range Comment   Glucose-Capillary 201 (*) 70 - 99 mg/dL    Comment 1 Notify RN     GLUCOSE,  CAPILLARY     Status: Abnormal   Collection Time   09/01/12  7:08 AM      Component Value Range Comment   Glucose-Capillary 125 (*) 70 - 99 mg/dL    Comment 1 Notify RN     GLUCOSE, CAPILLARY     Status: Abnormal   Collection Time   09/01/12 11:36 AM      Component Value Range Comment   Glucose-Capillary 158 (*) 70 - 99 mg/dL    Comment 1 Notify RN     GLUCOSE, CAPILLARY     Status: Abnormal   Collection Time   09/01/12  4:40 PM      Component Value Range Comment   Glucose-Capillary 135 (*) 70 - 99 mg/dL    Comment 1 Notify RN     GLUCOSE, CAPILLARY     Status: Abnormal   Collection Time   09/01/12  8:49 PM      Component Value Range Comment   Glucose-Capillary 154 (*) 70 - 99 mg/dL    Comment 1 Notify RN     GLUCOSE, CAPILLARY     Status: Abnormal   Collection Time   09/02/12 11:48 AM      Component Value Range Comment   Glucose-Capillary 226 (*) 70 - 99 mg/dL    Comment 1 Notify RN     GLUCOSE, CAPILLARY     Status: Abnormal   Collection Time   09/02/12  4:28 PM  Component Value Range Comment   Glucose-Capillary 143 (*) 70 - 99 mg/dL    Comment 1 Notify RN     GLUCOSE, CAPILLARY     Status: Abnormal   Collection Time   09/02/12  8:59 PM      Component Value Range Comment   Glucose-Capillary 169 (*) 70 - 99 mg/dL    Comment 1 Notify RN     GLUCOSE, CAPILLARY     Status: Abnormal   Collection Time   09/03/12  7:09 AM      Component Value Range Comment   Glucose-Capillary 133 (*) 70 - 99 mg/dL    Comment 1 Notify RN         Vitals reviewed.  Constitutional: He is oriented to person, place, and time.  HENT:  Head: Normocephalic.  Eyes:  Pupils round and reactive to light  Neck: Normal range of motion. Neck supple. No thyromegaly present.  Cardiovascular: Normal rate and regular rhythm.  Pulmonary/Chest: Effort normal and breath sounds normal. No respiratory distress.  Abdominal: Soft. Bowel sounds are normal. There is no tenderness.  Neurological: He is  alert and oriented to person, place, and time.  Follow simple commands. Dense right HP. Does display 1 to 1+ strength in right deltoid, bicep in flexor synergy pattern. Distal RUE is 0.5. Right HF is 1/5, knee ext 2-/5 distally is 0/5. DTR's are 2+ to 3+ on the right. Senses pain in the right arm, leg. No facial droop or tongue deviation. Cognitively appropriate. Speech is clear.  Skin: Skin is warm and dry.  Psychiatric: He has a normal mood and affect.   Assessment/Plan: 1. Functional deficits secondary to Left corona radiata infarct medically stable for D/C PCP f/u in 2 wks, PMR f/u in 1 month FIM: FIM - Bathing Bathing Steps Patient Completed: Chest;Right Arm;Abdomen;Front perineal area;Right upper leg;Left upper leg;Right lower leg (including foot);Left lower leg (including foot);Buttocks Bathing: 4: Min-Patient completes 8-9 57f 10 parts or 75+ percent  FIM - Upper Body Dressing/Undressing Upper body dressing/undressing steps patient completed: Thread/unthread right sleeve of pullover shirt/dresss;Thread/unthread left sleeve of pullover shirt/dress;Put head through opening of pull over shirt/dress;Pull shirt over trunk Upper body dressing/undressing: 5: Set-up assist to: Obtain clothing/put away FIM - Lower Body Dressing/Undressing Lower body dressing/undressing steps patient completed: Don/Doff right shoe;Don/Doff left shoe;Fasten/unfasten right shoe;Fasten/unfasten left shoe Lower body dressing/undressing: 5: Supervision: Safety issues/verbal cues  FIM - Toileting Toileting steps completed by patient: Adjust clothing prior to toileting;Performs perineal hygiene;Adjust clothing after toileting Toileting Assistive Devices: Grab bar or rail for support Toileting: 6: More than reasonable amount of time  FIM - Radio producer Devices: Grab bars Toilet Transfers: 4-To toilet/BSC: Min A (steadying Pt. > 75%);4-From toilet/BSC: Min A (steadying Pt. > 75%)  FIM -  Bed/Chair Transfer Bed/Chair Transfer Assistive Devices: Arm rests;Walker Bed/Chair Transfer: 5: Chair or W/C > Bed: Supervision (verbal cues/safety issues);4: Bed > Chair or W/C: Min A (steadying Pt. > 75%)  FIM - Locomotion: Wheelchair Distance: 150 Locomotion: Wheelchair: 5: Travels 150 ft or more: maneuvers on rugs and over door sills with supervision, cueing or coaxing FIM - Locomotion: Ambulation Locomotion: Ambulation Assistive Devices: Environmental consultant - Hemi;Orthosis Ambulation/Gait Assistance: 4: Min assist;Other (comment) (R AFO) Locomotion: Ambulation: 2: Travels 50 - 149 ft with minimal assistance (Pt.>75%)  Comprehension Comprehension Mode: Auditory Comprehension: 7-Follows complex conversation/direction: With no assist  Expression Expression Mode: Verbal Expression: 7-Expresses complex ideas: With no assist  Social Interaction Social Interaction Mode: Asleep Social Interaction:  7-Interacts appropriately with others - No medications needed.  Problem Solving Problem Solving Mode: Asleep Problem Solving: 7-Solves complex problems: Recognizes & self-corrects  Memory Memory Mode: Asleep Memory: 6-More than reasonable amt of time  Medical Problem List and Plan:  1. 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 DVT Prophylaxis/Anticoagulation: SCDs. Monitor for any signs of DVT  3. Neuropsych: This patient is capable of making decisions on his/her own behalf.  -will need some help with coping skills  -team to provide egosupport as needed  4. Hypertension.Uncontrolled, Increase Hydralazine 25mg  3 times a day, labetalol 200 mg 3 times a day. Monitor with increased activity  5. Diabetes mellitus.Uncontrolled but improving Hemoglobin A1c 7.5. Presently on sliding scale insulin. Check blood sugars a.c. and at bedtime.  Patient on Glucophage 1000 mg twice daily prior to admission and will resume as tolerated Currently creatinine is elevated 6. Hyperlipidemia. Zocor   7. Tobacco abuse/marijuana. NicoDerm patch and provide drug Counseling Discussed no smoking policy.  May have indoors pass with staff, plans to use e cigs as outp 8. Chronic renal insufficiency with baseline creatinine 2.10. Latest creatinine 2.07 08/24/2012 a followup chemistries  08/25/2012   LOS (Days) 9 A FACE TO FACE EVALUATION WAS PERFORMED  KIRSTEINS,ANDREW E 09/03/2012, 8:48 AM

## 2012-09-03 NOTE — Consult Note (Signed)
NEUROCOGNITIVE STATUS EXAMINATION - [place]   Mr. Shaheem Folley is a 56 year old, right-handed man who was seen for a neurocognitive status examination to assess his emotional state and mental status post-stroke.  According to his medical record, he was admitted on 08/23/2012 with right-sided weakness.  A CT was unremarkable except for demonstration of a remote lacunar infarct.  He reportedly had a stroke in August, 2013 with little residual effect after receiving TPA.  Neurology services suspect that his recent symptoms were likely secondary to left brain subcortical infarct, likely thrombotic secondary to small vessel disease and questionable medical noncompliance for treatment of diabetes.    Emotional Functioning:  During the clinical interview, Mr. Haser reported worrying about income and not being able to pay bills.  He commented that he feels depressed "all the time."  Overall, he said that he feels like "less than a man," because he has not been able to financially provide for his family.  He has been denied approval for Social Security disability, but plans to re-apply, as having some income would benefit him emotionally, as well as practically.  Mr. Woodrome is engaged and hopes to be able to provide a good life for his fianc.  According to report from support staff, Mr. Lary had expressed fears of not recovering well, particularly when reflecting on his experience watching his father's poor recovery following stroke.  However, during our session, he said that although he continues to have right-sided weakness and slurred speech, he feels he is making physical progress and denied issues in thinking about his father's stroke.  He denied any history of suicidal ideation, intent or plan and stated that he does not feel that his depressed mood is impacting his ability to fully participate in his recovery.  However, at times, he stated that he still feels  as though "nobody cares."  Although his physical limitations reportedly continue to cause him a certain amount of frustration, he stated that he is trying to "be strong," because worrying about his limitations will only cause him more stress.  Mr. Fronda reported dealing with his depression and stress in the past by smoking.  Although he admitted to continuing to smoke, he said that he would like to quit.  He commented that the nicotine patch was not helpful in deterring his cravings, but he has talked with his physician about initiating Chantix in hopes that it will help him quit.  An alternative way in which he said he relieves stress is through writing music and singing.  He commented that once he has transportation again, he hopes to return to attending church and become active in the church choir.    Mr. Shertzer responses to a self-report measure of depressive symptoms were not suggestive of the presence of significant depression at this time, though as noted previously, he described the presence of multiple symptoms of depression and stated that he feels depressed constantly.     Mental Status:  Mr. Retana total score on an overall measure of mental status was not suggestive of the presence of significant cognitive impairment (MMSE-2 brief = 14/16).  After easily registering 3 words into memory, he was able to freely recall 2 of 3 words after a brief delay.  He lost an additional point for misstating the season of the year.    Impressions and Recommendations:  Mr. Pannullo disclosed multiple symptoms of depression, particularly since his stroke in August, 2013 with exacerbation from his recent stroke.  As  such, he is likely presenting with an adjustment disorder with depressed mood.  During the session, we discussed coping strategies, including continuing to write music.  He also commented that it was helpful to speak with the neuropsychologist about his concerns as it helped him to "get it off  my chest."  He would likely respond well to other staff members allowing him space to discuss his concerns.  Staff should not feel obligated to respond in a specific way or offer solutions.  Simply listening empathically will be sufficient.  Following discharge, Mr. Guldner stated that he can talk with his fianc about his concerns and he can pray about it to help himself cope.  I encouraged him to use his spirituality to help him get through this time and staff members should feel free to remind him of that outlet as well.  In a similar way, Mr. Taranto may benefit from participation in individual therapy post-discharge and as such, an appropriate referral should be provided for that purpose.    DIAGNOSES: Stroke syndrome Adjustment disorder with depressed mood  Marlane Hatcher, Psy.D.  Clinical Neuropsychologist

## 2012-09-03 NOTE — Progress Notes (Signed)
Social Work Patient ID: Richard [name], male   DOB: Jan 06, 1956, 56 y.o.   MRN: [id number] Pt wants to be discharged this afternoon instead of tomorrow.  Checked with MD he reports no medical issues so ready for discharge. Team aware and will prepare for discharge. DME being delivered today to pt's room.  Home health to begin Sat or Monday.  Finance and pt Pleased with discharge today.  Family education completed.

## 2012-09-03 NOTE — Progress Notes (Signed)
Occupational Therapy Session Note  Patient Details  Name: Richard [name] MRN: [id number] Date of Birth: [birth year]  Today's Date: 09/03/2012 Time: 1030-1125 Time Calculation (min): 55 min  Short Term Goals: Week 1:  OT Short Term Goal 1 (Week 1): Bath: Supervision with shower to include sit and stand OT Short Term Goal 2 (Week 1): UB dressing:  Min assist with pull over shirt OT Short Term Goal 3 (Week 1): LB Dressing:  Min assist to include socks and shoes OT Short Term Goal 4 (Week 1): Toilet transfer: Min assist OT Short Term Goal 5 (Week 1): RUE Management:  No more than 2 VCs regarding management of RUE during all BADL and Functional Mobility.  Skilled Therapeutic Interventions/Progress Updates:    Pt seen for focus on hands on education with pt's fiance prior to d/c home this afternoon.  Pt reports completing bathing and dressing prior to this session with fiance assisting as needed.  Pt and fiance report that he completed bathing and dressing with only setup assistance from her.  She reports that she obtained the washcloth and started the water for him.  Engaged in [place] transfer with use of tub bench, with pt's fiance providing supervision.  He did not require any cues for safety with transfer to tub bench this session.  Ambulated with hemi-walker with close supervision from tub to toilet and completed toilet transfer at mod I level.  Pt and fiance report not having any questions and feeling prepared for d/c home.  Utilized e-stim to help facilitate digit flexion and extension in the right hand. E-stim performed in exercise mode as well as open exercise mode with cueing for pt to attempt opening and closing the hand to correspond with the stimulation. Pt with no active digit flexion or extension at this time.  Therapy Documentation Precautions:  Precautions Precautions: Fall Precaution Comments: right hemiparesis Restrictions Weight Bearing Restrictions: No Pain: Pain  Assessment Pain Assessment: No/denies pain  See FIM for current functional status  Therapy/Group: Individual Therapy  Sim Boast 09/03/2012, 12:26 PM

## 2012-09-03 NOTE — Progress Notes (Signed)
Occupational Therapy Discharge Summary  Patient Details  Name: Richard [name] MRN: [id number] Date of Birth: [birth year]  Today's Date: 09/03/2012 Time: 1030-1125 Time Calculation (min): 55 min  1st session            1500-1530   (30 min)   2nd session  Patient has met 11 of 11 long term goals due to improved activity tolerance, improved balance, postural control, ability to compensate for deficits and improved awareness.  Patient to discharge at overall Supervision level.  Patient's care partner is independent to provide the necessary physical and cognitive assistance at discharge.    Reasons goals not met: N/A  Recommendation:  Patient will benefit from ongoing skilled OT services in outpatient setting to continue to advance functional skills in the area of BADL, iADL and Reduce care partner burden.  Equipment: tub bench  Reasons for discharge: treatment goals met and discharge from hospital  Patient/family agrees with progress made and goals achieved: Yes  OT Discharge Precautions/Restrictions    General   Vital Signs   Pain Pain Assessment Pain Assessment: No/denies pain ADL   Vision/Perception :  No changes    Cognition: no problems noted   Sensation Sensation Light Touch: Appears Intact Stereognosis: Not tested Hot/Cold: Not tested Proprioception: Appears Intact Coordination Gross Motor Movements are Fluid and Coordinated: No Coordination and Movement Description: Limited by R hemiplegia weakness Heel Shin Test: Limited by R hemiplegia weakness Motor  Motor Motor: Hemiplegia;Motor impersistence Motor - Discharge Observations: R sided hemiplegia Mobility  Bed Mobility Bed Mobility: Supine to Sit;Sit to Supine Supine to Sit: 6: Modified independent (Device/Increase time) Sit to Supine: 6: Modified independent (Device/Increase time)  Trunk/Postural Assessment  Cervical Assessment Cervical Assessment: Within Functional Limits Thoracic  Assessment Thoracic Assessment: Within Functional Limits Lumbar Assessment Lumbar Assessment: Within Functional Limits  Balance Standardized Balance Assessment Standardized Balance Assessment: Berg Balance Test Berg Balance Test Sit to Stand: Able to stand  independently using hands Standing Unsupported: Able to stand safely 2 minutes Sitting with Back Unsupported but Feet Supported on Floor or Stool: Able to sit safely and securely 2 minutes Stand to Sit: Sits safely with minimal use of hands Transfers: Able to transfer safely, minor use of hands Standing Unsupported with Eyes Closed: Able to stand 10 seconds safely Standing Ubsupported with Feet Together: Able to place feet together independently and stand for 1 minute with supervision From Standing, Reach Forward with Outstretched Arm: Can reach forward >12 cm safely (5") From Standing Position, Pick up Object from Floor: Able to pick up shoe, needs supervision From Standing Position, Turn to Look Behind Over each Shoulder: Turn sideways only but maintains balance Turn 360 Degrees: Needs assistance while turning Standing Unsupported, Alternately Place Feet on Step/Stool: Able to complete >2 steps/needs minimal assist Standing Unsupported, One Foot in Front: Able to take small step independently and hold 30 seconds Standing on One Leg: Able to lift leg independently and hold equal to or more than 3 seconds Total Score: 39  Extremity/Trunk Assessment  Left/Right hand = wfl for sensation LUE= strength 5/5, coordination = wfl for eating, drinking from a cup, using shoe button RUE- Pt. Has some movements with shoulder flexion/extension with elbow bent; pronation, shoulder elevation and depression; scapular protraction/retraction.      See FIM for current functional status  Sim Boast 09/03/2012, 12:35 PM

## 2012-09-03 NOTE — Plan of Care (Signed)
Problem: RH KNOWLEDGE DEFICIT Goal: RH STG INCREASE KNOWLEDGE OF HYPERTENSION Patient will be able to verbalize understanding of normal BP, complaint with medication and diet regimen.  Outcome: Completed/Met Date Met:  09/03/12 Pt and significant other able to verbalize understanding of normal BP with medication and diet regimen. Pt has hx of noncompliance

## 2012-09-03 NOTE — Plan of Care (Signed)
Problem: RH KNOWLEDGE DEFICIT Goal: RH STG INCREASE KNOWLEDGE OF DIABETES Patient will be able to verbalize understanding of normal blood sugar level, the importance to medication and diet regimen.  Outcome: Completed/Met Date Met:  09/03/12 Pt and significant other able to verbalize understanding and importance of diabetic management. Pt has hx of noncompliance

## 2012-09-06 NOTE — Discharge Summary (Signed)
Discharge summary job # [id number]

## 2012-09-06 NOTE — Discharge Summary (Signed)
Richard [name], Richard [name]             ACCOUNT NO.:  [id number]  MEDICAL RECORD NO.:  [id number]  LOCATION:  [id number]                         FACILITY:  [facility]  PHYSICIAN:  Charlett Blake, M.D.DATE OF BIRTH:  Jul 15, 1956  DATE OF ADMISSION:  08/25/2012 DATE OF DISCHARGE:  09/03/2012                              DISCHARGE SUMMARY   DISCHARGE DIAGNOSES:  Left thrombotic subcortical infarct, sequential compression devices for deep venous thrombosis prophylaxis, hypertension, diabetes mellitus, hyperlipidemia, tobacco, marijuana use, chronic renal insufficiency with baseline creatinine 2.10.  A 55 year old right-handed male with diabetes mellitus, migraine headaches as well as CVA August 2013 with little residual and did receive tPA, who was admitted August 23, 2012 for right-sided weakness. CT of the head showed no acute intracranial abnormalities with remote lacunar infarct.  Neurology Services consulted with review of most recent echocardiogram completed during workup of stroke with ejection fraction of 123456, grade 2 diastolic dysfunction.  Carotid Dopplers with no ICA stenosis.  Close monitoring of blood pressure.  Urine drug screen positive for marijuana.  Neurology Services consulted, suspect left brain subcortical infarct felt to be thrombotic, questionable medical compliance.  Maintained on Plavix therapy.  NicoDerm patch was added for DVT prophylaxis.  The patient was admitted for comprehensive rehab program.  PAST MEDICAL HISTORY:  See discharge diagnoses.  SOCIAL HISTORY:  Lives with family, 1 level apartment, 14 steps to enter.  Functional history prior to admission was independent.  He had worked as a Administrator in the past.  Functional status upon admission to rehab services is +2 total assist stand pivot transfers.  PHYSICAL EXAMINATION:  VITAL SIGNS:  Blood pressure 141/75, pulse 85, respirations 18, temperature 97.8. GENERAL:  This was an alert male, in no acute  distress. HEENT:  Pupils are round and reactive to light. LUNGS:  Clear to auscultation. CARDIAC:  Regular rate and rhythm. ABDOMEN:  Soft, nontender.  Good bowel sounds. NEUROLOGIC:  He is oriented x3.  REHABILITATION HOSPITAL COURSE:  The patient was admitted to inpatient rehab services with therapies initiated on a 3-hour daily basis consisting of physical therapy, occupational therapy, and rehabilitation nursing.  The following issues were addressed during the patient's rehabilitation stay.  Pertaining to Richard [name] left thrombotic subcortical infarct remained on Plavix therapy, followup per Neurology Services.  Sequential compression devices were placed for DVT prophylaxis.  Blood pressures were monitored on hydralazine, labetalol with no orthostatic changes.  He did have a history of diabetes mellitus with questionable medical compliance.  Hemoglobin A1c is 7.5.  Close monitoring of renal function.  Creatinine 2.07 which was at baseline of 2.10.  He did have a history of tobacco and marijuana use with urine drug screen positive for marijuana.  He did receive bedside counseling in regards to cessation of any nicotine, illegal illicit drugs.  It was questionable if he will be compliant with these requests.  The patient received weekly collaborative interdisciplinary team conferences to discuss estimated length of stay, family teaching, and any barriers to discharge.  He was continent of bowel and bladder.  Overall supervision minimal assist, modified independent for toilet transfers and toileting as well as wheelchair level using grab bars, supervision transfers of wheelchair mobility, minimum  to moderate assist to ambulate as well as stairs.  Full family teaching was completed.  Issues discussed with family in regards to second level apartment with stairs and all safety issues associated with this.  A letter was given to him from the [facility] to help relocate to a first  floor.  The patient was a bit stubborn and did not want to do this.  DISCHARGE MEDICATIONS:  At the time of dictation included: 1. Plavix 75 mg daily. 2. Hydralazine 10 mg t.i.d. 3. Prilosec 20 mg daily. 4. Norvasc 5 mg daily. 5. Glucotrol-XL 5 mg daily. 6. Labetalol 200 mg t.i.d. 7. NicoDerm patch as advised. 8. Pamelor 25 mg at bedtime. 9. Pravachol 80 mg daily.  DIET:  Diabetic diet.  SPECIAL INSTRUCTIONS:  He would follow up with Dr. Alysia Penna at the outpatient rehab center September 24, 2012.     Lauraine Rinne, P.A.   ______________________________ Charlett Blake, M.D.    DA/MEDQ  D:  09/06/2012  T:  09/06/2012  Job:  [id number]  cc:   Charlett Blake, M.D.

## 2012-09-07 NOTE — Discharge Summary (Signed)
Follow up with Dr Letta Pate 3 weeks post d/c

## 2012-09-09 ENCOUNTER — Encounter (HOSPITAL_COMMUNITY): Payer: Self-pay | Admitting: Emergency Medicine

## 2012-09-09 ENCOUNTER — Other Ambulatory Visit: Payer: Self-pay

## 2012-09-09 ENCOUNTER — Emergency Department (HOSPITAL_COMMUNITY)
Admission: EM | Admit: 2012-09-09 | Discharge: 2012-09-09 | Disposition: A | Discharge status: Home / Self Care | Payer: Non-veteran care | Attending: Emergency Medicine | Admitting: Emergency Medicine

## 2012-09-09 DIAGNOSIS — F17200 Nicotine dependence, unspecified, uncomplicated: Secondary | ICD-10-CM | POA: Insufficient documentation

## 2012-09-09 DIAGNOSIS — Z79899 Other long term (current) drug therapy: Secondary | ICD-10-CM | POA: Insufficient documentation

## 2012-09-09 DIAGNOSIS — Z8679 Personal history of other diseases of the circulatory system: Secondary | ICD-10-CM | POA: Insufficient documentation

## 2012-09-09 DIAGNOSIS — E119 Type 2 diabetes mellitus without complications: Secondary | ICD-10-CM | POA: Insufficient documentation

## 2012-09-09 DIAGNOSIS — I10 Essential (primary) hypertension: Secondary | ICD-10-CM

## 2012-09-09 DIAGNOSIS — Z8673 Personal history of transient ischemic attack (TIA), and cerebral infarction without residual deficits: Secondary | ICD-10-CM | POA: Insufficient documentation

## 2012-09-09 DIAGNOSIS — Z8739 Personal history of other diseases of the musculoskeletal system and connective tissue: Secondary | ICD-10-CM | POA: Insufficient documentation

## 2012-09-09 DIAGNOSIS — Z760 Encounter for issue of repeat prescription: Secondary | ICD-10-CM

## 2012-09-09 DIAGNOSIS — K219 Gastro-esophageal reflux disease without esophagitis: Secondary | ICD-10-CM | POA: Insufficient documentation

## 2012-09-09 MED ORDER — AMLODIPINE BESYLATE 5 MG PO TABS: 5.0000 mg | ORAL_TABLET | Freq: Every day | ORAL | Status: DC

## 2012-09-09 MED ORDER — PRAVASTATIN SODIUM 40 MG PO TABS: 80.0000 mg | ORAL_TABLET | Freq: Every day | ORAL | Status: DC

## 2012-09-09 MED ORDER — GLIPIZIDE ER 5 MG PO TB24: 5.0000 mg | ORAL_TABLET | Freq: Every day | ORAL | Status: DC

## 2012-09-09 MED ORDER — HYDRALAZINE HCL 10 MG PO TABS: 10.0000 mg | ORAL_TABLET | Freq: Three times a day (TID) | ORAL | Status: DC

## 2012-09-09 MED ORDER — LABETALOL HCL 200 MG PO TABS: 200.0000 mg | ORAL_TABLET | Freq: Three times a day (TID) | ORAL | Status: DC

## 2012-09-09 MED ORDER — NORTRIPTYLINE HCL 25 MG PO CAPS: 25.0000 mg | ORAL_CAPSULE | Freq: Every day | ORAL | Status: DC

## 2012-09-09 MED ORDER — CLOPIDOGREL BISULFATE 75 MG PO TABS: 75.0000 mg | ORAL_TABLET | Freq: Every day | ORAL | Status: DC

## 2012-09-09 NOTE — ED Notes (Signed)
Pt recently discharged after having stroke and now is out of BP meds; pt noted to be htn

## 2012-09-09 NOTE — ED Provider Notes (Signed)
History     CSN: [id number]  Arrival date & time 09/09/12  1659   First MD Initiated Contact with Patient 09/09/12 1832      Chief Complaint  Patient presents with  . Medication Refill  . Hypertension    (Consider location/radiation/quality/duration/timing/severity/associated sxs/prior treatment) HPI  Patient states discharged from the hospital on the 13th after stroke. He had some of his medicines but does not have a prescription to have them refilled. He is going to the [facility] states he was told that he needed to have his prescriptions. He ran out of his Norvasc and labetalol yesterday. His blood pressure is systolically 123456 on my evaluation but he denies that he is having any headache, new weakness, or chest pain or shortness of breath. He is here stating he needs to have his medications refilled and he will followup with his physician at the [facility] in 1-2 weeks when he has an appointment.  Past Medical History  Diagnosis Date  . Hypertension   . Arthritis   . GERD (gastroesophageal reflux disease)   . Migraines   . Neuropathy   . Ruptured lumbar disc   . Diabetes mellitus     diet controlled  . Stroke     Past Surgical History  Procedure Date  . Knee surgery     bil    History reviewed. No pertinent family history.  History  Substance Use Topics  . Smoking status: Current Every Day Smoker -- 1.0 packs/day    Types: Cigarettes  . Smokeless tobacco: Not on file  . Alcohol Use: No      Review of Systems  Constitutional: Negative for fever and chills.  HENT: Negative for neck stiffness.   Eyes: Negative for visual disturbance.  Respiratory: Negative for shortness of breath.   Cardiovascular: Negative for chest pain.  Gastrointestinal: Negative for vomiting, diarrhea and blood in stool.  Genitourinary: Negative for dysuria, frequency and decreased urine volume.  Musculoskeletal: Negative for myalgias and joint swelling.  Skin: Negative for rash.   Neurological: Negative for weakness.  Hematological: Negative for adenopathy.  Psychiatric/Behavioral: Negative for agitation.    Allergies  Review of patient's allergies indicates no known allergies.  Home Medications   Current Outpatient Rx  Name  Route  Sig  Dispense  Refill  . AMLODIPINE BESYLATE 5 MG PO TABS   Oral   Take 1 tablet (5 mg total) by mouth daily.   30 tablet   1   . VITAMIN D 2000 UNITS PO CAPS   Oral   Take 1 capsule by mouth daily.         Marland Kitchen CLOPIDOGREL BISULFATE 75 MG PO TABS   Oral   Take 1 tablet (75 mg total) by mouth daily with breakfast.   30 tablet   1   . GLIPIZIDE ER 5 MG PO TB24   Oral   Take 1 tablet (5 mg total) by mouth daily with breakfast. For diabetes   30 tablet   1   . HYDRALAZINE HCL 10 MG PO TABS   Oral   Take 10 mg by mouth 3 (three) times daily.         Marland Kitchen LABETALOL HCL 200 MG PO TABS   Oral   Take 1 tablet (200 mg total) by mouth 3 (three) times daily. For blood pressure. Note change in dose.   90 tablet   1   . NICOTINE 14 MG/24HR TD PT24   Transdermal   Place  1 patch onto the skin daily.   28 patch   0   . NORTRIPTYLINE HCL 25 MG PO CAPS   Oral   Take 1 capsule (25 mg total) by mouth at bedtime. For neuropathy and insomnia   30 capsule   1   . OMEPRAZOLE 20 MG PO CPDR   Oral   Take 20 mg by mouth daily.         Marland Kitchen PRAVASTATIN SODIUM 40 MG PO TABS   Oral   Take 2 tablets (80 mg total) by mouth at bedtime.   60 tablet   1     BP 232/125  Pulse 83  Temp 98.7 F (37.1 C) (Oral)  Resp 18  SpO2 100%  Physical Exam  Nursing note and vitals reviewed. Constitutional: He is oriented to person, place, and time. He appears well-developed and well-nourished.  HENT:  Head: Normocephalic.  Right Ear: External ear normal.  Left Ear: External ear normal.  Eyes: Conjunctivae normal and EOM are normal. Pupils are equal, round, and reactive to light.  Neck: Normal range of motion. Neck supple.   Cardiovascular: Normal rate, regular rhythm and normal heart sounds.   Pulmonary/Chest: Effort normal and breath sounds normal.  Abdominal: Soft. Bowel sounds are normal.  Musculoskeletal:       Normal range of movement with the exception of the fingers of the right hand.  Neurological: He is oriented to person, place, and time.       Patient awake alert and oriented. Strength is decreased in right upper extremity and right lower extremity.  Skin: Skin is warm and dry.  Psychiatric: He has a normal mood and affect.    ED Course  Procedures (including critical care time)  Labs Reviewed - No data to display No results found.   No diagnosis found.    MDM  Plan refill of patient's medications and he will fall the [place], MD 09/09/12 1840

## 2012-09-10 ENCOUNTER — Telehealth: Payer: Self-pay

## 2012-09-10 NOTE — Telephone Encounter (Signed)
Patients blood pressure is still running high. 200/100  Richard [name] with [facility] wanted to let us know that he is not able to afford his medications.  She advised him if he starts to have symptoms again he needs to go the emergency room.  At this point she says he is asymptomatic.  A social worker is to meet with the patient soon.  Richard [name] just wanted to make sure this was documented.

## 2012-10-04 ENCOUNTER — Encounter: Payer: Non-veteran care | Attending: Physical Medicine & Rehabilitation

## 2012-10-04 ENCOUNTER — Inpatient Hospital Stay: Payer: Non-veteran care | Admitting: Physical Medicine & Rehabilitation

## 2012-10-08 ENCOUNTER — Telehealth: Payer: Self-pay

## 2012-10-08 NOTE — Telephone Encounter (Signed)
Verbal orders given to continue therapy.

## 2012-10-08 NOTE — Telephone Encounter (Signed)
Vaughan Basta a physical therapist with [facility] called to get verbal orders to continue therapy.  Patient is not ready for outpatient therapy yet.  Please call.

## 2013-10-17 ENCOUNTER — Encounter (HOSPITAL_COMMUNITY): Payer: Self-pay | Admitting: Emergency Medicine

## 2013-10-17 ENCOUNTER — Emergency Department (HOSPITAL_COMMUNITY)
Admission: EM | Admit: 2013-10-17 | Discharge: 2013-10-17 | Disposition: A | Discharge status: Home / Self Care | Payer: Medicaid Other | Attending: Emergency Medicine | Admitting: Emergency Medicine

## 2013-10-17 DIAGNOSIS — Z202 Contact with and (suspected) exposure to infections with a predominantly sexual mode of transmission: Secondary | ICD-10-CM | POA: Insufficient documentation

## 2013-10-17 DIAGNOSIS — I10 Essential (primary) hypertension: Secondary | ICD-10-CM | POA: Insufficient documentation

## 2013-10-17 DIAGNOSIS — R739 Hyperglycemia, unspecified: Secondary | ICD-10-CM

## 2013-10-17 DIAGNOSIS — Z8673 Personal history of transient ischemic attack (TIA), and cerebral infarction without residual deficits: Secondary | ICD-10-CM | POA: Insufficient documentation

## 2013-10-17 DIAGNOSIS — Z8719 Personal history of other diseases of the digestive system: Secondary | ICD-10-CM | POA: Insufficient documentation

## 2013-10-17 DIAGNOSIS — Z8739 Personal history of other diseases of the musculoskeletal system and connective tissue: Secondary | ICD-10-CM | POA: Insufficient documentation

## 2013-10-17 DIAGNOSIS — Z79899 Other long term (current) drug therapy: Secondary | ICD-10-CM | POA: Insufficient documentation

## 2013-10-17 DIAGNOSIS — M129 Arthropathy, unspecified: Secondary | ICD-10-CM | POA: Insufficient documentation

## 2013-10-17 DIAGNOSIS — Z7902 Long term (current) use of antithrombotics/antiplatelets: Secondary | ICD-10-CM | POA: Insufficient documentation

## 2013-10-17 DIAGNOSIS — E119 Type 2 diabetes mellitus without complications: Secondary | ICD-10-CM | POA: Insufficient documentation

## 2013-10-17 DIAGNOSIS — G589 Mononeuropathy, unspecified: Secondary | ICD-10-CM | POA: Insufficient documentation

## 2013-10-17 DIAGNOSIS — F17200 Nicotine dependence, unspecified, uncomplicated: Secondary | ICD-10-CM | POA: Insufficient documentation

## 2013-10-17 DIAGNOSIS — G43909 Migraine, unspecified, not intractable, without status migrainosus: Secondary | ICD-10-CM | POA: Insufficient documentation

## 2013-10-17 LAB — COMPREHENSIVE METABOLIC PANEL
ALBUMIN: 4 g/dL (ref 3.5–5.2)
ALT: 9 U/L (ref 0–53)
AST: 8 U/L (ref 0–37)
Alkaline Phosphatase: 169 U/L — ABNORMAL HIGH (ref 39–117)
BUN: 29 mg/dL — AB (ref 6–23)
CALCIUM: 9.8 mg/dL (ref 8.4–10.5)
CO2: 26 mEq/L (ref 19–32)
CREATININE: 2.67 mg/dL — AB (ref 0.50–1.35)
Chloride: 84 mEq/L — ABNORMAL LOW (ref 96–112)
GFR calc non Af Amer: 25 mL/min — ABNORMAL LOW (ref >90)
GFR, EST AFRICAN AMERICAN: 29 mL/min — AB (ref >90)
GLUCOSE: 728 mg/dL — AB (ref 70–99)
POTASSIUM: 5.1 meq/L (ref 3.7–5.3)
Sodium: 126 mEq/L — ABNORMAL LOW (ref 137–147)
TOTAL PROTEIN: 8.3 g/dL (ref 6.0–8.3)
Total Bilirubin: 0.2 mg/dL — ABNORMAL LOW (ref 0.3–1.2)

## 2013-10-17 LAB — URINALYSIS, ROUTINE W REFLEX MICROSCOPIC
Bilirubin Urine: NEGATIVE
HGB URINE DIPSTICK: NEGATIVE
Ketones, ur: NEGATIVE mg/dL
Leukocytes, UA: NEGATIVE
Nitrite: NEGATIVE
PROTEIN: 30 mg/dL — AB
Specific Gravity, Urine: 1.027 (ref 1.005–1.030)
Urobilinogen, UA: 0.2 mg/dL (ref 0.0–1.0)
pH: 5 (ref 5.0–8.0)

## 2013-10-17 LAB — CBC
HEMATOCRIT: 38.4 % — AB (ref 39.0–52.0)
HEMOGLOBIN: 14.3 g/dL (ref 13.0–17.0)
MCH: 31.4 pg (ref 26.0–34.0)
MCHC: 37.2 g/dL — AB (ref 30.0–36.0)
MCV: 84.2 fL (ref 78.0–100.0)
Platelets: 225 10*3/uL (ref 150–400)
RBC: 4.56 MIL/uL (ref 4.22–5.81)
RDW: 12.1 % (ref 11.5–15.5)
WBC: 8.6 10*3/uL (ref 4.0–10.5)

## 2013-10-17 LAB — POCT I-STAT 3, ART BLOOD GAS (G3+)
Bicarbonate: 24.8 mEq/L — ABNORMAL HIGH (ref 20.0–24.0)
O2 SAT: 95 %
PO2 ART: 75 mmHg — AB (ref 80.0–100.0)
Patient temperature: 98.6
TCO2: 26 mmol/L (ref 0–100)
pCO2 arterial: 39.4 mmHg (ref 35.0–45.0)
pH, Arterial: 7.407 (ref 7.350–7.450)

## 2013-10-17 LAB — URINE MICROSCOPIC-ADD ON

## 2013-10-17 LAB — GLUCOSE, CAPILLARY
GLUCOSE-CAPILLARY: 282 mg/dL — AB (ref 70–99)
Glucose-Capillary: 463 mg/dL — ABNORMAL HIGH (ref 70–99)

## 2013-10-17 MED ORDER — INSULIN ASPART 100 UNIT/ML ~~LOC~~ SOLN
10.0000 [IU] | Freq: Once | SUBCUTANEOUS | Status: AC
  Administered 2013-10-17: 10 [IU] via SUBCUTANEOUS
  Filled 2013-10-17: qty 1

## 2013-10-17 MED ORDER — SODIUM CHLORIDE 0.9 % IV BOLUS (SEPSIS)
1000.0000 mL | Freq: Once | INTRAVENOUS | Status: AC
  Administered 2013-10-17: 1000 mL via INTRAVENOUS

## 2013-10-17 MED ORDER — METFORMIN HCL 500 MG PO TABS: 500.0000 mg | ORAL_TABLET | Freq: Two times a day (BID) | ORAL | Status: DC

## 2013-10-17 NOTE — ED Provider Notes (Signed)
Medical screening examination/treatment/procedure(s) were performed by non-physician practitioner and as supervising physician I was immediately available for consultation/collaboration.  EKG Interpretation   None         Houston Siren, MD 10/17/13 2328

## 2013-10-17 NOTE — ED Notes (Signed)
RN attempt x 2 for IV access.

## 2013-10-17 NOTE — ED Provider Notes (Signed)
Richard [name] S 8:00 PM patient discussed in [place]. Patient with history of diabetes who has been taking glipizide 5 mg daily presenting with multiple complaints and found to have elevated blood sugar in the 700s. Patient strongly encouraged for hospital admission but has refused. He is agreeable for emergency room treatment of his elevated blood sugar. Plan to continue to monitor blood sugar and assess after treatments. We'll also plan to add additional treatments for his blood sugar.  10:20 PM Patient continued to have good improvement of his blood sugar with one dose of insulin and IV fluids. Sugar now in the 200s. He'll be discharged. We'll plan to give prescription for metformin that is treatment plan.  Martie Lee, PA-C 10/17/13 2226

## 2013-10-17 NOTE — Discharge Instructions (Signed)
You were seen and evaluated for your concern for possible STD. Your tests will take 2-3 days for results. You were also found to have elevated blood sugar and were treated with good improvement in the emergency department. You have been given a prescription for a new medication to also take to help with your diabetes and elevated blood sugar. Please followup with a primary care provider for continued evaluation and treatment.    High Blood Sugar High blood sugar (hyperglycemia) means that the level of sugar in your blood is higher than it should be. Signs of high blood sugar include:  Feeling thirsty.  Frequent peeing (urinating).  Feeling tired or sleepy.  Dry mouth.  Vision changes.  Feeling weak.  Feeling hungry but losing weight.  Numbness and tingling in your hands or feet.  Headache. When you ignore these signs, your blood sugar may keep going up. These problems may get worse, and other problems may begin. HOME CARE  Check your blood sugars as told by your doctor. Write down the numbers with the date and time.  Take the right amount of insulin or diabetes pills at the right time. Write down the dose with date and time.  Refill your insulin or diabetes pills before running out.  Watch what you eat. Follow your meal plan.  Drink liquids without sugar, such as water. Check with your doctor if you have kidney or heart disease.  Follow your doctor's orders for exercise. Exercise at the same time of day.  Keep your doctor's appointments. GET HELP RIGHT AWAY IF:   You have trouble thinking or are confused.  You have fast breathing with fruity smelling breath.  You pass out (faint).  You have 2 to 3 days of high blood sugars and you do not know why.  You have chest pain.  You are feeling sick to your stomach (nauseous) or throwing up (vomiting).  You have sudden vision changes. MAKE SURE YOU:   Understand these instructions.  Will watch your condition.  Will  get help right away if you are not doing well or get worse. Document Released: 07/06/2009 Document Revised: 12/01/2011 Document Reviewed: 07/06/2009 [facility] Patient Information 2014 [place], [place].

## 2013-10-17 NOTE — ED Notes (Signed)
Pt is here to get checked for std.  No symptoms but was girlfriend has discharge with odor.  PT wants blood sugar checked too because he has been urinating frequently

## 2013-10-17 NOTE — ED Provider Notes (Signed)
CSN: [id number]     Arrival date & time 10/17/13  1720 History   First MD Initiated Contact with Patient 10/17/13 1836     Chief Complaint  Patient presents with  . Exposure to STD   (Consider location/radiation/quality/duration/timing/severity/associated sxs/prior Treatment) HPI Richard [name] is a 58 y.o. male who presents emergency department with 2 complaints. Patient states that he wants to get checked for STDs. Patient states his girlfriend told him she had vaginal infection. Patient denies any symptoms. Patient denies any discharge from his urethra. He denies any pain in his scrotum or pain with urination. Patient's second complaint is possible elevated blood sugars. Patient states that he has had polyuria polydipsia over last week. He's got multiple medical issues including prior strokes, diabetes, hypertension, neuropathy. Patient states that his glucometer at home was not working. He goes to [place] for his care and states they're supposed to send him another one. Patient states last time he checked his blood sugar was a week ago it was 100. Patient's taking glipizide for his diabetes, states he has been taking it daily, and watching his diet. Patient denies any signs of infection. He denies any fever, upper respiratory infection, cough, abdominal pain, nausea, vomiting, diarrhea.  Past Medical History  Diagnosis Date  . Hypertension   . Arthritis   . GERD (gastroesophageal reflux disease)   . Migraines   . Neuropathy   . Ruptured lumbar disc   . Diabetes mellitus     diet controlled  . Stroke    Past Surgical History  Procedure Laterality Date  . Knee surgery      bil   No family history on file. History  Substance Use Topics  . Smoking status: Current Every Day Smoker -- 1.00 packs/day    Types: Cigarettes  . Smokeless tobacco: Not on file  . Alcohol Use: No    Review of Systems  Constitutional: Negative for fever and chills.  HENT: Negative for congestion and sore  throat.   Respiratory: Negative for cough, chest tightness and shortness of breath.   Cardiovascular: Negative for chest pain, palpitations and leg swelling.  Gastrointestinal: Negative for nausea, vomiting, abdominal pain, diarrhea and abdominal distention.  Endocrine: Positive for polydipsia and polyuria.  Genitourinary: Positive for frequency. Negative for dysuria, urgency and hematuria.  Musculoskeletal: Negative for arthralgias, myalgias, neck pain and neck stiffness.  Skin: Negative for rash.  Neurological: Negative for dizziness, weakness, light-headedness, numbness and headaches.    Allergies  Review of patient's allergies indicates no known allergies.  Home Medications   Current Outpatient Rx  Name  Route  Sig  Dispense  Refill  . amLODipine (NORVASC) 5 MG tablet   Oral   Take 1 tablet (5 mg total) by mouth daily.   30 tablet   1   . Cholecalciferol (VITAMIN D) 2000 UNITS CAPS   Oral   Take 1 capsule by mouth daily.         . clopidogrel (PLAVIX) 75 MG tablet   Oral   Take 1 tablet (75 mg total) by mouth daily with breakfast.   30 tablet   1   . glipiZIDE (GLUCOTROL XL) 5 MG 24 hr tablet   Oral   Take 1 tablet (5 mg total) by mouth daily with breakfast. For diabetes   30 tablet   1   . hydrALAZINE (APRESOLINE) 10 MG tablet   Oral   Take 1 tablet (10 mg total) by mouth 3 (three) times daily.   [place]  tablet   0   . labetalol (NORMODYNE) 200 MG tablet   Oral   Take 1 tablet (200 mg total) by mouth 3 (three) times daily. For blood pressure. Note change in dose.   90 tablet   1   . nortriptyline (PAMELOR) 25 MG capsule   Oral   Take 1 capsule (25 mg total) by mouth at bedtime. For neuropathy and insomnia   30 capsule   1   . pravastatin (PRAVACHOL) 40 MG tablet   Oral   Take 2 tablets (80 mg total) by mouth at bedtime.   60 tablet   1    BP 156/96  Pulse 92  Temp(Src) 98 F (36.7 C) (Oral)  Resp 18  Wt 180 lb (81.647 kg)  SpO2 100% Physical  Exam  Nursing note and vitals reviewed. Constitutional: He appears well-developed and well-nourished. No distress.  HENT:  Head: Normocephalic and atraumatic.  Eyes: Conjunctivae are normal.  Neck: Neck supple.  Cardiovascular: Normal rate, regular rhythm and normal heart sounds.   Pulmonary/Chest: Effort normal. No respiratory distress. He has no wheezes. He has no rales.  Abdominal: Soft. Bowel sounds are normal. He exhibits no distension. There is no tenderness. There is no rebound.  Genitourinary: Penis normal.  Normal genitalia, scrotum and testes normal with no tenderness.  Musculoskeletal: He exhibits no edema.  Neurological: He is alert.  Skin: Skin is warm and dry.    ED Course  Procedures (including critical care time) Labs Review Labs Reviewed  GLUCOSE, CAPILLARY - Abnormal; Notable for the following:    Glucose-Capillary >600 (*)    All other components within normal limits  CBC - Abnormal; Notable for the following:    HCT 38.4 (*)    MCHC 37.2 (*)    All other components within normal limits  COMPREHENSIVE METABOLIC PANEL - Abnormal; Notable for the following:    Sodium 126 (*)    Chloride 84 (*)    Glucose, Bld 728 (*)    BUN 29 (*)    Creatinine, Ser 2.67 (*)    Alkaline Phosphatase 169 (*)    Total Bilirubin 0.2 (*)    GFR calc non Af Amer 25 (*)    GFR calc Af Amer 29 (*)    All other components within normal limits  URINALYSIS, ROUTINE W REFLEX MICROSCOPIC - Abnormal; Notable for the following:    Glucose, UA >1000 (*)    Protein, ur 30 (*)    All other components within normal limits  POCT I-STAT 3, BLOOD GAS (G3+) - Abnormal; Notable for the following:    pO2, Arterial 75.0 (*)    Bicarbonate 24.8 (*)    All other components within normal limits  GC/CHLAMYDIA PROBE AMP  URINE MICROSCOPIC-ADD ON   Imaging Review No results found.  EKG Interpretation   None       MDM  No diagnosis found.   7:45 PM Pts glucose 728, does not appear to  be in DKA or have HHS. He is mentating well, no nausea or vomiting, no ketosis, no anion gap. I have ordered him fluids, SQ insulin. Pt may need admission, for no cause of his hyperglycemia is found and he will need most likely medication adjustment. Pt is followed by [facility].   7:54 PM  Discussed admission with pt. He refuses. Explained consequences of uncontrolled blood sugar. Pt voiced understanding. He is Ox3 at this time and I believe is mentally able to make his decisions. Will continue with  treatment here. When CBG reasonable, d/c home with close follow up  8:17 PM Pt signed out at shift change to [place].   Renold Genta, PA-C 10/17/13 2017

## 2013-10-18 LAB — GC/CHLAMYDIA PROBE AMP
CT PROBE, AMP APTIMA: NEGATIVE
GC Probe RNA: NEGATIVE

## 2014-02-01 ENCOUNTER — Other Ambulatory Visit: Payer: Self-pay | Admitting: Family Medicine

## 2014-02-01 ENCOUNTER — Ambulatory Visit
Admission: RE | Admit: 2014-02-01 | Discharge: 2014-02-01 | Disposition: A | Discharge status: Home / Self Care | Payer: Medicaid Other | Source: Ambulatory Visit | Attending: Family Medicine | Admitting: Family Medicine

## 2014-02-01 ENCOUNTER — Encounter: Payer: Self-pay | Admitting: Gastroenterology

## 2014-02-01 DIAGNOSIS — I10 Essential (primary) hypertension: Secondary | ICD-10-CM

## 2014-02-01 DIAGNOSIS — F17200 Nicotine dependence, unspecified, uncomplicated: Secondary | ICD-10-CM

## 2014-04-04 ENCOUNTER — Ambulatory Visit: Payer: Medicaid Other | Admitting: Physician Assistant

## 2014-04-06 ENCOUNTER — Telehealth: Payer: Self-pay | Admitting: *Deleted

## 2014-04-06 NOTE — Telephone Encounter (Signed)
I called the patient and asked why he didn't come fir his appointment with Amy the PA? He had a personal issue and was waiting for his new Medicaid card.   I advised him we cancelled his  appt for the colon.  When he gets his new card he will call to make an appt with the PA and schedule the colonsocopy.

## 2014-04-11 ENCOUNTER — Encounter: Payer: Medicaid Other | Admitting: Gastroenterology

## 2014-04-13 ENCOUNTER — Encounter (HOSPITAL_COMMUNITY): Payer: Self-pay | Admitting: Emergency Medicine

## 2014-04-13 ENCOUNTER — Emergency Department (HOSPITAL_COMMUNITY)
Admission: EM | Admit: 2014-04-13 | Discharge: 2014-04-13 | Disposition: A | Discharge status: Home / Self Care | Payer: Medicaid Other | Attending: Emergency Medicine | Admitting: Emergency Medicine

## 2014-04-13 ENCOUNTER — Emergency Department (HOSPITAL_COMMUNITY): Payer: Medicaid Other

## 2014-04-13 DIAGNOSIS — Z79899 Other long term (current) drug therapy: Secondary | ICD-10-CM | POA: Diagnosis not present

## 2014-04-13 DIAGNOSIS — F17200 Nicotine dependence, unspecified, uncomplicated: Secondary | ICD-10-CM | POA: Diagnosis not present

## 2014-04-13 DIAGNOSIS — G589 Mononeuropathy, unspecified: Secondary | ICD-10-CM | POA: Diagnosis not present

## 2014-04-13 DIAGNOSIS — Y9301 Activity, walking, marching and hiking: Secondary | ICD-10-CM | POA: Insufficient documentation

## 2014-04-13 DIAGNOSIS — I10 Essential (primary) hypertension: Secondary | ICD-10-CM | POA: Insufficient documentation

## 2014-04-13 DIAGNOSIS — Z8719 Personal history of other diseases of the digestive system: Secondary | ICD-10-CM | POA: Diagnosis not present

## 2014-04-13 DIAGNOSIS — S79919A Unspecified injury of unspecified hip, initial encounter: Secondary | ICD-10-CM | POA: Diagnosis not present

## 2014-04-13 DIAGNOSIS — Y9289 Other specified places as the place of occurrence of the external cause: Secondary | ICD-10-CM | POA: Diagnosis not present

## 2014-04-13 DIAGNOSIS — M7032 Other bursitis of elbow, left elbow: Secondary | ICD-10-CM

## 2014-04-13 DIAGNOSIS — S46909A Unspecified injury of unspecified muscle, fascia and tendon at shoulder and upper arm level, unspecified arm, initial encounter: Secondary | ICD-10-CM | POA: Insufficient documentation

## 2014-04-13 DIAGNOSIS — Z7901 Long term (current) use of anticoagulants: Secondary | ICD-10-CM | POA: Diagnosis not present

## 2014-04-13 DIAGNOSIS — E119 Type 2 diabetes mellitus without complications: Secondary | ICD-10-CM | POA: Diagnosis not present

## 2014-04-13 DIAGNOSIS — S4980XA Other specified injuries of shoulder and upper arm, unspecified arm, initial encounter: Secondary | ICD-10-CM | POA: Diagnosis present

## 2014-04-13 DIAGNOSIS — Z8673 Personal history of transient ischemic attack (TIA), and cerebral infarction without residual deficits: Secondary | ICD-10-CM | POA: Diagnosis not present

## 2014-04-13 DIAGNOSIS — Z8739 Personal history of other diseases of the musculoskeletal system and connective tissue: Secondary | ICD-10-CM | POA: Insufficient documentation

## 2014-04-13 DIAGNOSIS — S79929A Unspecified injury of unspecified thigh, initial encounter: Principal | ICD-10-CM

## 2014-04-13 MED ORDER — METHOCARBAMOL 500 MG PO TABS: 500.0000 mg | ORAL_TABLET | Freq: Two times a day (BID) | ORAL | Status: DC

## 2014-04-13 MED ORDER — HYDROCODONE-ACETAMINOPHEN 5-325 MG PO TABS: 1.0000 | ORAL_TABLET | Freq: Four times a day (QID) | ORAL | Status: DC | PRN
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
## 2014-04-14 NOTE — ED Provider Notes (Signed)
Medical screening examination/treatment/procedure(s) were performed by non-physician practitioner and as supervising physician I was immediately available for consultation/collaboration.   EKG Interpretation None        Fredia Sorrow, MD 04/14/14 [phone]

## 2014-05-28 ENCOUNTER — Emergency Department (HOSPITAL_COMMUNITY)
Admission: EM | Admit: 2014-05-28 | Discharge: 2014-05-28 | Disposition: A | Discharge status: Home / Self Care | Payer: Medicaid Other | Attending: Emergency Medicine | Admitting: Emergency Medicine

## 2014-05-28 ENCOUNTER — Encounter (HOSPITAL_COMMUNITY): Payer: Self-pay | Admitting: Emergency Medicine

## 2014-05-28 DIAGNOSIS — Z8739 Personal history of other diseases of the musculoskeletal system and connective tissue: Secondary | ICD-10-CM | POA: Diagnosis not present

## 2014-05-28 DIAGNOSIS — Z8719 Personal history of other diseases of the digestive system: Secondary | ICD-10-CM | POA: Insufficient documentation

## 2014-05-28 DIAGNOSIS — IMO0002 Reserved for concepts with insufficient information to code with codable children: Secondary | ICD-10-CM | POA: Diagnosis not present

## 2014-05-28 DIAGNOSIS — E119 Type 2 diabetes mellitus without complications: Secondary | ICD-10-CM | POA: Diagnosis not present

## 2014-05-28 DIAGNOSIS — Z79899 Other long term (current) drug therapy: Secondary | ICD-10-CM | POA: Insufficient documentation

## 2014-05-28 DIAGNOSIS — T7492XA Unspecified child maltreatment, confirmed, initial encounter: Secondary | ICD-10-CM | POA: Insufficient documentation

## 2014-05-28 DIAGNOSIS — T7491XA Unspecified adult maltreatment, confirmed, initial encounter: Secondary | ICD-10-CM | POA: Insufficient documentation

## 2014-05-28 DIAGNOSIS — S0180XA Unspecified open wound of other part of head, initial encounter: Secondary | ICD-10-CM | POA: Diagnosis present

## 2014-05-28 DIAGNOSIS — Z8673 Personal history of transient ischemic attack (TIA), and cerebral infarction without residual deficits: Secondary | ICD-10-CM | POA: Insufficient documentation

## 2014-05-28 DIAGNOSIS — G43909 Migraine, unspecified, not intractable, without status migrainosus: Secondary | ICD-10-CM | POA: Insufficient documentation

## 2014-05-28 DIAGNOSIS — F17200 Nicotine dependence, unspecified, uncomplicated: Secondary | ICD-10-CM | POA: Insufficient documentation

## 2014-05-28 DIAGNOSIS — I10 Essential (primary) hypertension: Secondary | ICD-10-CM | POA: Insufficient documentation

## 2014-05-28 DIAGNOSIS — S0181XA Laceration without foreign body of other part of head, initial encounter: Secondary | ICD-10-CM

## 2014-05-28 MED ORDER — IBUPROFEN 800 MG PO TABS: 800.0000 mg | ORAL_TABLET | Freq: Three times a day (TID) | ORAL | Status: DC
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
## 2014-05-28 NOTE — ED Notes (Signed)
Pt to ED via [facility] wt a lac over LT eye.

## 2014-05-28 NOTE — ED Notes (Signed)
Pt refused to end telephone call for triage information. Unable to triage Pt at this time

## 2014-05-28 NOTE — ED Provider Notes (Signed)
Medical screening examination/treatment/procedure(s) were performed by non-physician practitioner and as supervising physician I was immediately available for consultation/collaboration.   EKG Interpretation None        Hoy Morn, MD 05/28/14 2201
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
## 2014-11-23 ENCOUNTER — Emergency Department (HOSPITAL_COMMUNITY)
Admission: EM | Admit: 2014-11-23 | Discharge: 2014-11-23 | Disposition: A | Discharge status: Home / Self Care | Payer: Non-veteran care | Attending: Emergency Medicine | Admitting: Emergency Medicine

## 2014-11-23 ENCOUNTER — Emergency Department (HOSPITAL_COMMUNITY): Payer: Non-veteran care

## 2014-11-23 ENCOUNTER — Encounter (HOSPITAL_COMMUNITY): Payer: Self-pay | Admitting: *Deleted

## 2014-11-23 DIAGNOSIS — Z794 Long term (current) use of insulin: Secondary | ICD-10-CM | POA: Insufficient documentation

## 2014-11-23 DIAGNOSIS — R0602 Shortness of breath: Secondary | ICD-10-CM | POA: Diagnosis present

## 2014-11-23 DIAGNOSIS — R5383 Other fatigue: Secondary | ICD-10-CM | POA: Diagnosis not present

## 2014-11-23 DIAGNOSIS — J209 Acute bronchitis, unspecified: Secondary | ICD-10-CM | POA: Insufficient documentation

## 2014-11-23 DIAGNOSIS — Z7901 Long term (current) use of anticoagulants: Secondary | ICD-10-CM | POA: Diagnosis not present

## 2014-11-23 DIAGNOSIS — G629 Polyneuropathy, unspecified: Secondary | ICD-10-CM | POA: Insufficient documentation

## 2014-11-23 DIAGNOSIS — E119 Type 2 diabetes mellitus without complications: Secondary | ICD-10-CM | POA: Diagnosis not present

## 2014-11-23 DIAGNOSIS — K219 Gastro-esophageal reflux disease without esophagitis: Secondary | ICD-10-CM | POA: Diagnosis not present

## 2014-11-23 DIAGNOSIS — Z79899 Other long term (current) drug therapy: Secondary | ICD-10-CM | POA: Diagnosis not present

## 2014-11-23 DIAGNOSIS — Z8673 Personal history of transient ischemic attack (TIA), and cerebral infarction without residual deficits: Secondary | ICD-10-CM | POA: Insufficient documentation

## 2014-11-23 DIAGNOSIS — I10 Essential (primary) hypertension: Secondary | ICD-10-CM | POA: Insufficient documentation

## 2014-11-23 DIAGNOSIS — Z720 Tobacco use: Secondary | ICD-10-CM | POA: Insufficient documentation

## 2014-11-23 DIAGNOSIS — M1990 Unspecified osteoarthritis, unspecified site: Secondary | ICD-10-CM | POA: Diagnosis not present

## 2014-11-23 DIAGNOSIS — R630 Anorexia: Secondary | ICD-10-CM | POA: Insufficient documentation

## 2014-11-23 DIAGNOSIS — G43909 Migraine, unspecified, not intractable, without status migrainosus: Secondary | ICD-10-CM | POA: Diagnosis not present

## 2014-11-23 MED ORDER — PREDNISONE 20 MG PO TABS: 40.0000 mg | ORAL_TABLET | Freq: Every day | ORAL | Status: DC

## 2014-11-23 MED ORDER — GUAIFENESIN 100 MG/5ML PO LIQD: 100.0000 mg | ORAL | Status: DC | PRN

## 2014-11-23 MED ORDER — AZITHROMYCIN 250 MG PO TABS: 250.0000 mg | ORAL_TABLET | Freq: Every day | ORAL | Status: DC

## 2014-11-23 NOTE — ED Notes (Signed)
Patient states he has been having a persistent productive cough for about 5 days and has not tried any over the counter medications. Patient has had bronchitis in the past and feels this is similar.

## 2014-11-23 NOTE — Discharge Instructions (Signed)

## 2014-11-23 NOTE — ED Provider Notes (Signed)
CSN: [id number]     Arrival date & time 11/23/14  1217 History  This chart was scribed for non-physician practitioner Hanley Hays working with No att. providers found by Donato Schultz, ED Scribe. This patient was seen in room WTR7/WTR7 and the patient's care was started at 12:42 PM.     Chief Complaint  Patient presents with  . Shortness of Breath  . Cough   Patient is a 59 y.o. male presenting with shortness of breath and cough. The history is provided by the patient. No language interpreter was used.  Shortness of Breath Associated symptoms: cough and wheezing   Associated symptoms: no abdominal pain, no chest pain, no ear pain, no headaches, no rash, no sore throat and no vomiting   Cough Associated symptoms: shortness of breath and wheezing   Associated symptoms: no chest pain, no ear pain, no headaches, no myalgias, no rash and no sore throat    HPI Comments: Layson Ligocki is a 59 y.o. male with a history of GERD, hypertension, DM, and TIA who presents to the Emergency Department complaining of constant cough productive of white sputum that started 7 days ago.  Deep inspiration aggravates his cough.  He has been drinking ginger ale and eating Hall's with no relief to his symptoms.  He has not taken any OTC medications for his symptoms.  He denies sick contacts.  He does not have a history of pneumonia but has been diagnosed with bronchitis in the past.  He is a smoker.  He reports checking his sugars on a daily basis which have been normal. He lists fatigue, nasal congestion, postnasal drip, SOB, intermittent wheezing, and loss of appetite as associated symptoms.  He denies abdominal pain, eye pain, nausea, vomiting, diarrhea, sore throat, fever, palpitations, and chest pain as associated symptoms.     Past Medical History  Diagnosis Date  . Hypertension   . Arthritis   . GERD (gastroesophageal reflux disease)   . Migraines   . Neuropathy   . Ruptured lumbar disc   .  Diabetes mellitus     diet controlled  . Stroke    Past Surgical History  Procedure Laterality Date  . Knee surgery      bil   History reviewed. No pertinent family history. History  Substance Use Topics  . Smoking status: Current Every Day Smoker -- 1.00 packs/day    Types: Cigarettes  . Smokeless tobacco: Not on file  . Alcohol Use: No    Review of Systems  Constitutional: Positive for appetite change (decreased) and fatigue.  HENT: Positive for congestion and postnasal drip. Negative for ear pain and sore throat.   Eyes: Negative for pain.  Respiratory: Positive for cough, shortness of breath and wheezing.   Cardiovascular: Negative for chest pain and palpitations.  Gastrointestinal: Negative for nausea, vomiting, abdominal pain and diarrhea.  Musculoskeletal: Negative for myalgias and arthralgias.  Skin: Negative for rash.  Neurological: Negative for light-headedness and headaches.    Allergies  Review of patient's allergies indicates no known allergies.  Home Medications   Prior to Admission medications   Medication Sig Start Date End Date Taking? Authorizing Provider  amLODipine (NORVASC) 10 MG tablet Take 10 mg by mouth daily.   Yes Historical Provider, MD  Cholecalciferol (VITAMIN D) 2000 UNITS CAPS Take 1 capsule by mouth daily.   Yes Historical Provider, MD  clopidogrel (PLAVIX) 75 MG tablet Take 1 tablet (75 mg total) by mouth daily with breakfast. 09/09/12  Yes Danielle  Maretta Bees, MD  glipiZIDE (GLUCOTROL XL) 5 MG 24 hr tablet Take 1 tablet (5 mg total) by mouth daily with breakfast. For diabetes Patient taking differently: Take 5 mg by mouth 2 (two) times daily. For diabetes 09/09/12  Yes Shaune Pollack, MD  hydrALAZINE (APRESOLINE) 10 MG tablet Take 1 tablet (10 mg total) by mouth 3 (three) times daily. 09/09/12  Yes Shaune Pollack, MD  HYDROcodone-acetaminophen (NORCO/VICODIN) 5-325 MG per tablet Take 1 tablet by mouth every 6 (six) hours as needed for moderate  pain or severe pain. 04/13/14  Yes Domenic Moras, PA-C  insulin glargine (LANTUS) 100 unit/mL SOPN Inject 12 Units into the skin daily.   Yes Historical Provider, MD  labetalol (NORMODYNE) 200 MG tablet Take 1 tablet (200 mg total) by mouth 3 (three) times daily. For blood pressure. Note change in dose. 09/09/12  Yes Shaune Pollack, MD  loratadine (CLARITIN) 10 MG tablet Take 10 mg by mouth daily as needed for allergies.   Yes Historical Provider, MD  nortriptyline (PAMELOR) 25 MG capsule Take 1 capsule (25 mg total) by mouth at bedtime. For neuropathy and insomnia 09/09/12  Yes Shaune Pollack, MD  pantoprazole (PROTONIX) 40 MG tablet Take 40 mg by mouth 2 (two) times daily before a meal.   Yes Historical Provider, MD  pravastatin (PRAVACHOL) 80 MG tablet Take 80 mg by mouth at bedtime.   Yes Historical Provider, MD  sertraline (ZOLOFT) 100 MG tablet Take 100 mg by mouth daily.   Yes Historical Provider, MD  sildenafil (VIAGRA) 100 MG tablet Take 100 mg by mouth as needed for erectile dysfunction. Take 1 hour prior to sexual activity-- do not exceed 1 dose per 24 hour period.   Yes Historical Provider, MD  spironolactone-hydrochlorothiazide (ALDACTAZIDE) 25-25 MG per tablet Take 1 tablet by mouth daily.   Yes Historical Provider, MD  triamcinolone cream (KENALOG) 0.1 % Apply 1 application topically daily.   Yes Historical Provider, MD  amLODipine (NORVASC) 5 MG tablet Take 1 tablet (5 mg total) by mouth daily. 09/09/12   Shaune Pollack, MD  azithromycin (ZITHROMAX Z-PAK) 250 MG tablet Take 1 tablet (250 mg total) by mouth daily. 500mg  PO day 1, then 250mg  PO days 205 11/23/14   Verda Cumins Kore Madlock, PA-C  guaiFENesin (ROBITUSSIN) 100 MG/5ML liquid Take 5-10 mLs (100-200 mg total) by mouth every 4 (four) hours as needed for cough. 11/23/14   Verda Cumins Mailey Landstrom, PA-C  ibuprofen (ADVIL,MOTRIN) 800 MG tablet Take 1 tablet (800 mg total) by mouth 3 (three) times daily. 05/28/14   Shari A Upstill, PA-C  metFORMIN  (GLUCOPHAGE) 500 MG tablet Take 1 tablet (500 mg total) by mouth 2 (two) times daily. 10/17/13   Ruthell Rummage Dammen, PA-C  methocarbamol (ROBAXIN) 500 MG tablet Take 1 tablet (500 mg total) by mouth 2 (two) times daily. 04/13/14   Domenic Moras, PA-C  pravastatin (PRAVACHOL) 40 MG tablet Take 2 tablets (80 mg total) by mouth at bedtime. 09/09/12   Shaune Pollack, MD  predniSONE (DELTASONE) 20 MG tablet Take 2 tablets (40 mg total) by mouth daily. 11/23/14   Hanley Hays, PA-C   Triage Vitals: BP 163/83 mmHg  Pulse 86  Temp(Src) 98.2 F (36.8 C) (Oral)  Resp 21  SpO2 100%  Physical Exam  Constitutional: He is oriented to person, place, and time. He appears well-developed and well-nourished. No distress.  Non toxic appearing.   HENT:  Head: Normocephalic and atraumatic.  Right Ear: Hearing,  tympanic membrane, external ear and ear canal normal.  Left Ear: Hearing, tympanic membrane, external ear and ear canal normal.  Mouth/Throat: Oropharynx is clear and moist. No oropharyngeal exudate.  Eyes: Conjunctivae and EOM are normal. Pupils are equal, round, and reactive to light. Right eye exhibits no discharge. Left eye exhibits no discharge. No scleral icterus.  Neck: Normal range of motion. Neck supple. No JVD present. No thyromegaly present.  Cardiovascular: Normal rate, regular rhythm, normal heart sounds and intact distal pulses.  Exam reveals no gallop and no friction rub.   No murmur heard. Pulmonary/Chest: Effort normal and breath sounds normal. No respiratory distress. He has no wheezes. He has no rales. He exhibits no tenderness.  Lungs are clear to auscultation bilaterally.  Abdominal: Soft. Bowel sounds are normal. He exhibits no distension. There is no tenderness.  Musculoskeletal: Normal range of motion. He exhibits no edema.  Lymphadenopathy:    He has no cervical adenopathy.  Neurological: He is alert and oriented to person, place, and time. Coordination normal.  Right sided  deficits.   Skin: Skin is warm and dry. No rash noted. He is not diaphoretic. No erythema. No pallor.  Psychiatric: He has a normal mood and affect. His behavior is normal.  Nursing note and vitals reviewed.   ED Course  Procedures (including critical care time)  DIAGNOSTIC STUDIES: Oxygen Saturation is 100% on room air, normal by my interpretation.    COORDINATION OF CARE: 12:48 PM- Will obtain a chest x-ray to rule out pneumonia and the patient agreed to the treatment plan.  Labs Review Labs Reviewed - No data to display  Imaging Review Dg Chest 2 View (if Patient Has Fever And/or Copd)  11/23/2014   CLINICAL DATA:  59 year old male with shortness of breath and productive cough for 1 week, with mild chest discomfort.  EXAM: CHEST  2 VIEW  COMPARISON:  Chest x-ray 02/01/2014.  FINDINGS: Lung volumes are normal. No consolidative airspace disease. No pleural effusions. No pneumothorax. No pulmonary nodule or mass noted. Pulmonary vasculature and the cardiomediastinal silhouette are within normal limits. Prominent bilateral nipple shadows unchanged compared to the prior examination.  IMPRESSION: No radiographic evidence of acute cardiopulmonary disease.   Electronically Signed   By: Vinnie Langton M.D.   On: 11/23/2014 13:18     EKG Interpretation None      Filed Vitals:   11/23/14 1224 11/23/14 1345  BP: 163/83   Pulse: 86 81  Temp: 98.2 F (36.8 C)   TempSrc: Oral   Resp: 21   SpO2: 100% 99%     MDM   Meds given in ED:  Medications - No data to display  Discharge Medication List as of 11/23/2014  1:40 PM    START taking these medications   Details  azithromycin (ZITHROMAX Z-PAK) 250 MG tablet Take 1 tablet (250 mg total) by mouth daily. 500mg  PO day 1, then 250mg  PO days 205, Starting 11/23/2014, Until Discontinued, Print    guaiFENesin (ROBITUSSIN) 100 MG/5ML liquid Take 5-10 mLs (100-200 mg total) by mouth every 4 (four) hours as needed for cough., Starting 11/23/2014,  Until Discontinued, Print    predniSONE (DELTASONE) 20 MG tablet Take 2 tablets (40 mg total) by mouth daily., Starting 11/23/2014, Until Discontinued, Print        Final diagnoses:  Acute bronchitis, unspecified organism   This is a 59 y.o. male with a history of GERD, hypertension, DM, and TIA who presents to the Emergency Department complaining of constant  cough productive of white sputum that started 7 days ago. The patient is a smoker. Patient denies fevers chills or current shortness of breath. The patient is afebrile and nontoxic appearing. The patient is not tachypneic, hypoxic or tachycardic. Lungs are clear to auscultation bilaterally. Chest x-ray is negative. Because the patient is a smoker and has diabetes we'll discharge with a Z-Pak for bronchitis. Also discharged with guaifenesin and short course of prednisone for 5 days. Strict return precautions provided. I advised the patient to follow-up with their primary care provider this week. I advised the patient to return to the emergency department with new or worsening symptoms or new concerns. The patient verbalized understanding and agreement with plan.   This patient was discussed with Dr. Leonides Schanz who agrees with assessment and plan.  I personally performed the services described in this documentation, which was scribed in my presence. The recorded information has been reviewed and is accurate.     Hanley Hays, PA-C 11/23/14 2034  [place], DO 11/24/14 1022

## 2014-11-29 ENCOUNTER — Encounter (HOSPITAL_COMMUNITY): Payer: Self-pay | Admitting: *Deleted

## 2014-11-29 ENCOUNTER — Inpatient Hospital Stay (HOSPITAL_COMMUNITY)
Admission: EM | Admit: 2014-11-29 | Discharge: 2014-11-30 | DRG: 638 | Disposition: A | Discharge status: Home / Self Care | Payer: Medicare Other | Attending: Internal Medicine | Admitting: Internal Medicine

## 2014-11-29 DIAGNOSIS — N183 Chronic kidney disease, stage 3 (moderate): Secondary | ICD-10-CM | POA: Diagnosis present

## 2014-11-29 DIAGNOSIS — Z79899 Other long term (current) drug therapy: Secondary | ICD-10-CM | POA: Diagnosis not present

## 2014-11-29 DIAGNOSIS — F17210 Nicotine dependence, cigarettes, uncomplicated: Secondary | ICD-10-CM | POA: Diagnosis present

## 2014-11-29 DIAGNOSIS — I129 Hypertensive chronic kidney disease with stage 1 through stage 4 chronic kidney disease, or unspecified chronic kidney disease: Secondary | ICD-10-CM | POA: Diagnosis present

## 2014-11-29 DIAGNOSIS — Z8673 Personal history of transient ischemic attack (TIA), and cerebral infarction without residual deficits: Secondary | ICD-10-CM

## 2014-11-29 DIAGNOSIS — E119 Type 2 diabetes mellitus without complications: Secondary | ICD-10-CM

## 2014-11-29 DIAGNOSIS — E1122 Type 2 diabetes mellitus with diabetic chronic kidney disease: Secondary | ICD-10-CM

## 2014-11-29 DIAGNOSIS — E785 Hyperlipidemia, unspecified: Secondary | ICD-10-CM | POA: Diagnosis present

## 2014-11-29 DIAGNOSIS — R05 Cough: Secondary | ICD-10-CM

## 2014-11-29 DIAGNOSIS — T380X5A Adverse effect of glucocorticoids and synthetic analogues, initial encounter: Secondary | ICD-10-CM | POA: Diagnosis present

## 2014-11-29 DIAGNOSIS — G629 Polyneuropathy, unspecified: Secondary | ICD-10-CM | POA: Diagnosis present

## 2014-11-29 DIAGNOSIS — R059 Cough, unspecified: Secondary | ICD-10-CM

## 2014-11-29 DIAGNOSIS — R739 Hyperglycemia, unspecified: Secondary | ICD-10-CM | POA: Diagnosis not present

## 2014-11-29 DIAGNOSIS — N189 Chronic kidney disease, unspecified: Secondary | ICD-10-CM | POA: Diagnosis not present

## 2014-11-29 DIAGNOSIS — E871 Hypo-osmolality and hyponatremia: Secondary | ICD-10-CM | POA: Diagnosis present

## 2014-11-29 DIAGNOSIS — Z7902 Long term (current) use of antithrombotics/antiplatelets: Secondary | ICD-10-CM

## 2014-11-29 DIAGNOSIS — M1990 Unspecified osteoarthritis, unspecified site: Secondary | ICD-10-CM | POA: Diagnosis present

## 2014-11-29 DIAGNOSIS — J40 Bronchitis, not specified as acute or chronic: Secondary | ICD-10-CM | POA: Diagnosis present

## 2014-11-29 DIAGNOSIS — K219 Gastro-esophageal reflux disease without esophagitis: Secondary | ICD-10-CM | POA: Diagnosis present

## 2014-11-29 DIAGNOSIS — Z794 Long term (current) use of insulin: Secondary | ICD-10-CM | POA: Diagnosis not present

## 2014-11-29 DIAGNOSIS — E1165 Type 2 diabetes mellitus with hyperglycemia: Secondary | ICD-10-CM | POA: Diagnosis not present

## 2014-11-29 DIAGNOSIS — N184 Chronic kidney disease, stage 4 (severe): Secondary | ICD-10-CM | POA: Diagnosis present

## 2014-11-29 LAB — COMPREHENSIVE METABOLIC PANEL
ALBUMIN: 4 g/dL (ref 3.5–5.2)
ALK PHOS: 172 U/L — AB (ref 39–117)
ALT: 14 U/L (ref 0–53)
ANION GAP: 12 (ref 5–15)
AST: 17 U/L (ref 0–37)
BUN: 60 mg/dL — ABNORMAL HIGH (ref 6–23)
CALCIUM: 9.4 mg/dL (ref 8.4–10.5)
CO2: 24 mmol/L (ref 19–32)
Chloride: 85 mmol/L — ABNORMAL LOW (ref 96–112)
Creatinine, Ser: 3.3 mg/dL — ABNORMAL HIGH (ref 0.50–1.35)
GFR, EST AFRICAN AMERICAN: 22 mL/min — AB (ref >90)
GFR, EST NON AFRICAN AMERICAN: 19 mL/min — AB (ref >90)
Glucose, Bld: 926 mg/dL (ref 70–99)
POTASSIUM: 5.1 mmol/L (ref 3.5–5.1)
SODIUM: 121 mmol/L — AB (ref 135–145)
Total Bilirubin: 0.6 mg/dL (ref 0.3–1.2)
Total Protein: 8.6 g/dL — ABNORMAL HIGH (ref 6.0–8.3)

## 2014-11-29 LAB — CBG MONITORING, ED
Glucose-Capillary: >600 mg/dL (ref 70–99)
Glucose-Capillary: >600 mg/dL (ref 70–99)
Glucose-Capillary: 504 mg/dL — ABNORMAL HIGH (ref 70–99)

## 2014-11-29 LAB — URINALYSIS, ROUTINE W REFLEX MICROSCOPIC
Bilirubin Urine: NEGATIVE
Glucose, UA: >1000 mg/dL — AB
Hgb urine dipstick: NEGATIVE
Ketones, ur: NEGATIVE mg/dL
Leukocytes, UA: NEGATIVE
Nitrite: NEGATIVE
PH: 5 (ref 5.0–8.0)
Protein, ur: 30 mg/dL — AB
Specific Gravity, Urine: 1.027 (ref 1.005–1.030)
Urobilinogen, UA: 0.2 mg/dL (ref 0.0–1.0)

## 2014-11-29 LAB — URINE MICROSCOPIC-ADD ON

## 2014-11-29 LAB — CBC WITH DIFFERENTIAL/PLATELET
BASOS ABS: 0 10*3/uL (ref 0.0–0.1)
Basophils Relative: 0 % (ref 0–1)
Eosinophils Absolute: 0 10*3/uL (ref 0.0–0.7)
Eosinophils Relative: 0 % (ref 0–5)
HCT: 39.7 % (ref 39.0–52.0)
Hemoglobin: 14.3 g/dL (ref 13.0–17.0)
LYMPHS PCT: 9 % — AB (ref 12–46)
Lymphs Abs: 0.9 10*3/uL (ref 0.7–4.0)
MCH: 29.9 pg (ref 26.0–34.0)
MCHC: 36 g/dL (ref 30.0–36.0)
MCV: 82.9 fL (ref 78.0–100.0)
MONOS PCT: 5 % (ref 3–12)
Monocytes Absolute: 0.5 10*3/uL (ref 0.1–1.0)
NEUTROS PCT: 86 % — AB (ref 43–77)
Neutro Abs: 9 10*3/uL — ABNORMAL HIGH (ref 1.7–7.7)
PLATELETS: 279 10*3/uL (ref 150–400)
RBC: 4.79 MIL/uL (ref 4.22–5.81)
RDW: 12 % (ref 11.5–15.5)
WBC: 10.4 10*3/uL (ref 4.0–10.5)

## 2014-11-29 MED ORDER — HYDROCHLOROTHIAZIDE 25 MG PO TABS
25.0000 mg | ORAL_TABLET | Freq: Every day | ORAL | Status: DC
  Administered 2014-11-30: 25 mg via ORAL
  Filled 2014-11-29: qty 1

## 2014-11-29 MED ORDER — LORATADINE 10 MG PO TABS
10.0000 mg | ORAL_TABLET | Freq: Every day | ORAL | Status: DC | PRN
  Filled 2014-11-29: qty 1

## 2014-11-29 MED ORDER — LABETALOL HCL 200 MG PO TABS
200.0000 mg | ORAL_TABLET | Freq: Three times a day (TID) | ORAL | Status: DC
  Administered 2014-11-30: 200 mg via ORAL
  Filled 2014-11-29 (×6): qty 1

## 2014-11-29 MED ORDER — VITAMIN D3 25 MCG (1000 UNIT) PO TABS
1000.0000 [IU] | ORAL_TABLET | Freq: Every day | ORAL | Status: DC
  Administered 2014-11-30: 1000 [IU] via ORAL
  Filled 2014-11-29: qty 1

## 2014-11-29 MED ORDER — SODIUM CHLORIDE 0.9 % IV BOLUS (SEPSIS)
1000.0000 mL | Freq: Once | INTRAVENOUS | Status: AC
  Administered 2014-11-29: 1000 mL via INTRAVENOUS

## 2014-11-29 MED ORDER — HYDRALAZINE HCL 10 MG PO TABS
10.0000 mg | ORAL_TABLET | Freq: Three times a day (TID) | ORAL | Status: DC
  Administered 2014-11-30 (×2): 10 mg via ORAL
  Filled 2014-11-29 (×4): qty 1

## 2014-11-29 MED ORDER — DEXTROSE-NACL 5-0.45 % IV SOLN: INTRAVENOUS | Status: DC

## 2014-11-29 MED ORDER — HEPARIN SODIUM (PORCINE) 5000 UNIT/ML IJ SOLN
5000.0000 [IU] | Freq: Three times a day (TID) | INTRAMUSCULAR | Status: DC
  Filled 2014-11-29 (×3): qty 1

## 2014-11-29 MED ORDER — SODIUM CHLORIDE 0.9 % IV SOLN
INTRAVENOUS | Status: DC
  Administered 2014-11-29: 5.4 [IU]/h via INTRAVENOUS
  Filled 2014-11-29 (×2): qty 2.5

## 2014-11-29 MED ORDER — CLOPIDOGREL BISULFATE 75 MG PO TABS
75.0000 mg | ORAL_TABLET | Freq: Every day | ORAL | Status: DC
  Administered 2014-11-30: 75 mg via ORAL
  Filled 2014-11-29: qty 1

## 2014-11-29 MED ORDER — AMLODIPINE BESYLATE 10 MG PO TABS
10.0000 mg | ORAL_TABLET | Freq: Every day | ORAL | Status: DC
  Administered 2014-11-30: 10 mg via ORAL
  Filled 2014-11-29: qty 1

## 2014-11-29 MED ORDER — SODIUM CHLORIDE 0.9 % IV SOLN: INTRAVENOUS | Status: AC

## 2014-11-29 MED ORDER — SPIRONOLACTONE-HCTZ 25-25 MG PO TABS: 1.0000 | ORAL_TABLET | Freq: Every day | ORAL | Status: DC

## 2014-11-29 MED ORDER — METHOCARBAMOL 500 MG PO TABS
500.0000 mg | ORAL_TABLET | Freq: Two times a day (BID) | ORAL | Status: DC
  Administered 2014-11-30: 500 mg via ORAL
  Filled 2014-11-29 (×3): qty 1

## 2014-11-29 MED ORDER — SODIUM CHLORIDE 0.9 % IV SOLN
INTRAVENOUS | Status: DC
  Administered 2014-11-29: via INTRAVENOUS
  Administered 2014-11-30: 0.7 [IU]/h via INTRAVENOUS
  Administered 2014-11-30: 0.5 [IU]/h via INTRAVENOUS

## 2014-11-29 MED ORDER — PRAVASTATIN SODIUM 80 MG PO TABS
80.0000 mg | ORAL_TABLET | Freq: Every day | ORAL | Status: DC
  Administered 2014-11-30: 80 mg via ORAL
  Filled 2014-11-29 (×2): qty 1

## 2014-11-29 MED ORDER — SODIUM CHLORIDE 0.9 % IV SOLN
INTRAVENOUS | Status: DC
  Administered 2014-11-30: 125 mL/h via INTRAVENOUS

## 2014-11-29 MED ORDER — POTASSIUM CHLORIDE 10 MEQ/100ML IV SOLN: 10.0000 meq | INTRAVENOUS | Status: AC

## 2014-11-29 MED ORDER — DEXTROSE-NACL 5-0.45 % IV SOLN
INTRAVENOUS | Status: DC
  Administered 2014-11-30: 100 mL/h via INTRAVENOUS

## 2014-11-29 MED ORDER — SPIRONOLACTONE 25 MG PO TABS
25.0000 mg | ORAL_TABLET | Freq: Every day | ORAL | Status: DC
  Administered 2014-11-30: 25 mg via ORAL
  Filled 2014-11-29: qty 1

## 2014-11-29 MED ORDER — SERTRALINE HCL 100 MG PO TABS
100.0000 mg | ORAL_TABLET | Freq: Every day | ORAL | Status: DC
  Administered 2014-11-30: 100 mg via ORAL
  Filled 2014-11-29: qty 1

## 2014-11-29 MED ORDER — PANTOPRAZOLE SODIUM 40 MG PO TBEC
40.0000 mg | DELAYED_RELEASE_TABLET | Freq: Two times a day (BID) | ORAL | Status: DC
  Administered 2014-11-30: 40 mg via ORAL
  Filled 2014-11-29: qty 1

## 2014-11-29 MED ORDER — NORTRIPTYLINE HCL 25 MG PO CAPS
25.0000 mg | ORAL_CAPSULE | Freq: Every day | ORAL | Status: DC
  Administered 2014-11-30: 25 mg via ORAL
  Filled 2014-11-29 (×2): qty 1

## 2014-11-29 MED ORDER — HYDROCODONE-ACETAMINOPHEN 5-325 MG PO TABS: 1.0000 | ORAL_TABLET | Freq: Four times a day (QID) | ORAL | Status: DC | PRN

## 2014-11-29 NOTE — ED Provider Notes (Signed)
CSN: [id number]     Arrival date & time 11/29/14  1732 History   First MD Initiated Contact with Patient 11/29/14 1854     Chief Complaint  Patient presents with  . Hyperglycemia   (Consider location/radiation/quality/duration/timing/severity/associated sxs/prior Treatment) HPI Comments: 59 yo M hx of HTN, DMI, CVA, GERD, presents with CC of hyperglycemia.  Pt was seen at [facility] ED 11/23/14 for cough.  He was ultimately diagnosed with bronchitis and started on a Z-pack and Prednisone (5-day course).  Pt states his cough has been improving, however his blood sugars have been elevated.  He went to an urgent care in [place] today and he reports CBG there was greater than 700.  He was sent here for further evaluation.  Pt denies, fever, chills, CP, SOB, abdominal pain, nausea, vomiting, diarrhea, rash, myalgias, or any other symptoms.  Pt has been compliant with his home medications including glipizide, and Lantus.  No other conerns.    The history is provided by the patient. No language interpreter was used.    Past Medical History  Diagnosis Date  . Hypertension   . Arthritis   . GERD (gastroesophageal reflux disease)   . Migraines   . Neuropathy   . Ruptured lumbar disc   . Diabetes mellitus     diet controlled  . Stroke    Past Surgical History  Procedure Laterality Date  . Knee surgery      bil   No family history on file. History  Substance Use Topics  . Smoking status: Current Every Day Smoker -- 1.00 packs/day    Types: Cigarettes  . Smokeless tobacco: Not on file  . Alcohol Use: No    Review of Systems  Constitutional: Negative for fever and chills.  HENT: Positive for congestion.   Respiratory: Positive for cough. Negative for shortness of breath.   Cardiovascular: Negative for chest pain, palpitations and leg swelling.  Gastrointestinal: Negative for nausea, vomiting, abdominal pain, diarrhea and constipation.  Genitourinary: Negative for dysuria.  Musculoskeletal:  Negative for myalgias.  Skin: Negative for rash.  Neurological: Negative for dizziness, weakness, light-headedness, numbness and headaches.  Hematological: Negative for adenopathy. Does not bruise/bleed easily.  All other systems reviewed and are negative.     Allergies  Review of patient's allergies indicates no known allergies.  Home Medications   Prior to Admission medications   Medication Sig Start Date End Date Taking? Authorizing Provider  amLODipine (NORVASC) 5 MG tablet Take 1 tablet (5 mg total) by mouth daily. 09/09/12  Yes Pattricia Boss, MD  azithromycin (ZITHROMAX Z-PAK) 250 MG tablet Take 1 tablet (250 mg total) by mouth daily. 500mg  PO day 1, then 250mg  PO days 205 11/23/14  Yes Waynetta Pean, PA-C  Cholecalciferol (VITAMIN D) 2000 UNITS CAPS Take 1 capsule by mouth daily.   Yes Historical Provider, MD  clopidogrel (PLAVIX) 75 MG tablet Take 1 tablet (75 mg total) by mouth daily with breakfast. 09/09/12  Yes Pattricia Boss, MD  glipiZIDE (GLUCOTROL XL) 5 MG 24 hr tablet Take 1 tablet (5 mg total) by mouth daily with breakfast. For diabetes Patient taking differently: Take 5 mg by mouth 2 (two) times daily. For diabetes 09/09/12  Yes Pattricia Boss, MD  guaiFENesin (ROBITUSSIN) 100 MG/5ML liquid Take 5-10 mLs (100-200 mg total) by mouth every 4 (four) hours as needed for cough. 11/23/14  Yes Waynetta Pean, PA-C  hydrALAZINE (APRESOLINE) 10 MG tablet Take 1 tablet (10 mg total) by mouth 3 (three) times daily. 09/09/12  Yes Pattricia Boss, MD  HYDROcodone-acetaminophen (NORCO/VICODIN) 5-325 MG per tablet Take 1 tablet by mouth every 6 (six) hours as needed for moderate pain or severe pain. 04/13/14  Yes Domenic Moras, PA-C  insulin glargine (LANTUS) 100 unit/mL SOPN Inject 12 Units into the skin daily.   Yes Historical Provider, MD  labetalol (NORMODYNE) 200 MG tablet Take 1 tablet (200 mg total) by mouth 3 (three) times daily. For blood pressure. Note change in dose. 09/09/12  Yes Pattricia Boss, MD  loratadine (CLARITIN) 10 MG tablet Take 10 mg by mouth daily as needed for allergies.   Yes Historical Provider, MD  methocarbamol (ROBAXIN) 500 MG tablet Take 1 tablet (500 mg total) by mouth 2 (two) times daily. 04/13/14  Yes Domenic Moras, PA-C  nortriptyline (PAMELOR) 25 MG capsule Take 1 capsule (25 mg total) by mouth at bedtime. For neuropathy and insomnia 09/09/12  Yes Pattricia Boss, MD  pantoprazole (PROTONIX) 40 MG tablet Take 40 mg by mouth 2 (two) times daily before a meal.   Yes Historical Provider, MD  pravastatin (PRAVACHOL) 80 MG tablet Take 80 mg by mouth at bedtime.   Yes Historical Provider, MD  predniSONE (DELTASONE) 20 MG tablet Take 2 tablets (40 mg total) by mouth daily. 11/23/14  Yes Waynetta Pean, PA-C  sertraline (ZOLOFT) 100 MG tablet Take 100 mg by mouth daily.   Yes Historical Provider, MD  sildenafil (VIAGRA) 100 MG tablet Take 100 mg by mouth as needed for erectile dysfunction. Take 1 hour prior to sexual activity-- do not exceed 1 dose per 24 hour period.   Yes Historical Provider, MD  spironolactone-hydrochlorothiazide (ALDACTAZIDE) 25-25 MG per tablet Take 1 tablet by mouth daily.   Yes Historical Provider, MD  triamcinolone cream (KENALOG) 0.1 % Apply 1 application topically daily.   Yes Historical Provider, MD  ibuprofen (ADVIL,MOTRIN) 800 MG tablet Take 1 tablet (800 mg total) by mouth 3 (three) times daily. Patient not taking: Reported on 11/29/2014 05/28/14   Charlann Lange, PA-C  metFORMIN (GLUCOPHAGE) 500 MG tablet Take 1 tablet (500 mg total) by mouth 2 (two) times daily. Patient not taking: Reported on 11/29/2014 10/17/13   Hazel Sams, PA-C  pravastatin (PRAVACHOL) 40 MG tablet Take 2 tablets (80 mg total) by mouth at bedtime. Patient not taking: Reported on 11/29/2014 09/09/12   Pattricia Boss, MD   BP 163/80 mmHg  Pulse 68  Temp(Src) 98.4 F (36.9 C) (Oral)  Resp 18  SpO2 99% Physical Exam  Constitutional: He is oriented to person, place, and time. He  appears well-developed and well-nourished.  HENT:  Head: Normocephalic and atraumatic.  Right Ear: External ear normal.  Left Ear: External ear normal.  Mouth/Throat: Oropharynx is clear and moist.  Eyes: Conjunctivae and EOM are normal. Pupils are equal, round, and reactive to light.  Neck: Normal range of motion. Neck supple.  Cardiovascular: Normal rate, regular rhythm, normal heart sounds and intact distal pulses.   Pulmonary/Chest: Effort normal and breath sounds normal. No respiratory distress. He has no wheezes. He has no rales. He exhibits no tenderness.  Abdominal: Soft. Bowel sounds are normal. He exhibits no distension and no mass. There is no tenderness. There is no rebound and no guarding.  Musculoskeletal: Normal range of motion.  Neurological: He is alert and oriented to person, place, and time.  Skin: Skin is warm and dry.  Nursing note and vitals reviewed.   ED Course  Procedures (including critical care time) Labs Review Labs Reviewed  CBC WITH  DIFFERENTIAL/PLATELET - Abnormal; Notable for the following:    Neutrophils Relative % 86 (*)    Neutro Abs 9.0 (*)    Lymphocytes Relative 9 (*)    All other components within normal limits  COMPREHENSIVE METABOLIC PANEL - Abnormal; Notable for the following:    Sodium 121 (*)    Chloride 85 (*)    Glucose, Bld 926 (*)    BUN 60 (*)    Creatinine, Ser 3.30 (*)    Total Protein 8.6 (*)    Alkaline Phosphatase 172 (*)    GFR calc non Af Amer 19 (*)    GFR calc Af Amer 22 (*)    All other components within normal limits  URINALYSIS, ROUTINE W REFLEX MICROSCOPIC - Abnormal; Notable for the following:    Glucose, UA >1000 (*)    Protein, ur 30 (*)    All other components within normal limits  CBG MONITORING, ED - Abnormal; Notable for the following:    Glucose-Capillary >600 (*)    All other components within normal limits  CBG MONITORING, ED - Abnormal; Notable for the following:    Glucose-Capillary >600 (*)    All  other components within normal limits  CBG MONITORING, ED - Abnormal; Notable for the following:    Glucose-Capillary >600 (*)    All other components within normal limits  CBG MONITORING, ED - Abnormal; Notable for the following:    Glucose-Capillary 504 (*)    All other components within normal limits  URINE MICROSCOPIC-ADD ON    Imaging Review No results found.   EKG Interpretation None      MDM   Final diagnoses:  Hyperglycemia   59 yo M hx of HTN, DMI, CVA, GERD, presents with CC of hyperglycemia.   Physical exam as above.  VS WNL.    Labs demonstrate sodium 121, glucose 926, with normal bicarb and anion gap.  UA negative for ketones.    Diagnosis of nonketotic hyperglycemia, 2/2 DM and steroid use.    Pt given IVF, started on glucose stabilizer.    Medicine consulted for admission.  Pt understands and agrees with plan.   Discussed pt with Dr. Vanita Panda.   Sinda Du      Sinda Du, MD 11/29/14 2316  Carmin Muskrat, MD 11/29/14 [phone]

## 2014-11-29 NOTE — Progress Notes (Signed)
Received report form Ed.

## 2014-11-29 NOTE — ED Notes (Signed)
The pt was seen at an urgent care in [place] today and was called back after he was seen and was told he had high blood sugar.  He was seen at [facility] long ed in the past 3-04 days and diagnosed with  Bronchitis.  He reports that his blood sugar is usually under control

## 2014-11-29 NOTE — H&P (Signed)
[facility] History and Physical  Richard [name] [id number] DOB: [birth year] DOA: 11/29/2014  Referring physician: Carmin Muskrat, MD PCP: Pcp Not In System   Chief Complaint: Hyperglycemia  HPI: Richard [name] is a 59 y.o. male presents with hyperglycemia. He states that he had some bronchitis for about a week. He went to the [facility] and was given antibiotics and also steroids. Patient states that his sugars have been running high since that time. Patient also was given some robitussin. He states that his glucose was running in the 700 range. He states that he went to the [facility] in [place] and was told to come the ED. On presentation here his glucose was 926. Patient denies fevers or chills. He has some cough whjich is improving. He has noted a dry mouth and also going to the bathroom frequently. Patient states no abdominal complaints. He states that he has no diarrhea and no nausea or vomiting.   Review of Systems:  Constitutional:  No weight loss, night sweats, Fevers, chills, +fatigue.  HEENT:  No headaches, No sneezing, itching, ear ache, nasal congestion, post nasal drip,  Cardio-vascular:  No chest pain, Orthopnea, PND, swelling in lower extremities  GI:  No heartburn, indigestion, abdominal pain, nausea, vomiting, diarrhea  Resp:  No shortness of breath with exertion or at rest. ++cough, No coughing up of blood.  Skin:  no rash or lesions GU:  no dysuria, change in color of urine, no urgency or frequency  Musculoskeletal:  No joint pain or swelling. No decreased range of motion.  Psych:  No change in mood or affect. No depression or anxiety. No memory loss.   Past Medical History  Diagnosis Date  . Hypertension   . Arthritis   . GERD (gastroesophageal reflux disease)   . Migraines   . Neuropathy   . Ruptured lumbar disc   . Diabetes mellitus     diet controlled  . Stroke    Past Surgical History  Procedure Laterality Date  . Knee surgery     bil   Social History:  reports that he has been smoking Cigarettes.  He has been smoking about 1.00 pack per day. He does not have any smokeless tobacco history on file. He reports that he uses illicit drugs (Marijuana). He reports that he does not drink alcohol.  No Known Allergies  No family history on file.   Prior to Admission medications   Medication Sig Start Date End Date Taking? Authorizing Provider  amLODipine (NORVASC) 10 MG tablet Take 10 mg by mouth daily.    Historical Provider, MD  amLODipine (NORVASC) 5 MG tablet Take 1 tablet (5 mg total) by mouth daily. 09/09/12   Pattricia Boss, MD  azithromycin (ZITHROMAX Z-PAK) 250 MG tablet Take 1 tablet (250 mg total) by mouth daily. 500mg  PO day 1, then 250mg  PO days 205 11/23/14   Waynetta Pean, PA-C  Cholecalciferol (VITAMIN D) 2000 UNITS CAPS Take 1 capsule by mouth daily.    Historical Provider, MD  clopidogrel (PLAVIX) 75 MG tablet Take 1 tablet (75 mg total) by mouth daily with breakfast. 09/09/12   Pattricia Boss, MD  glipiZIDE (GLUCOTROL XL) 5 MG 24 hr tablet Take 1 tablet (5 mg total) by mouth daily with breakfast. For diabetes Patient taking differently: Take 5 mg by mouth 2 (two) times daily. For diabetes 09/09/12   Pattricia Boss, MD  guaiFENesin (ROBITUSSIN) 100 MG/5ML liquid Take 5-10 mLs (100-200 mg total) by mouth every 4 (four) hours as needed  for cough. 11/23/14   Waynetta Pean, PA-C  hydrALAZINE (APRESOLINE) 10 MG tablet Take 1 tablet (10 mg total) by mouth 3 (three) times daily. 09/09/12   Pattricia Boss, MD  HYDROcodone-acetaminophen (NORCO/VICODIN) 5-325 MG per tablet Take 1 tablet by mouth every 6 (six) hours as needed for moderate pain or severe pain. 04/13/14   Domenic Moras, PA-C  ibuprofen (ADVIL,MOTRIN) 800 MG tablet Take 1 tablet (800 mg total) by mouth 3 (three) times daily. 05/28/14   Charlann Lange, PA-C  insulin glargine (LANTUS) 100 unit/mL SOPN Inject 12 Units into the skin daily.    Historical Provider, MD  labetalol  (NORMODYNE) 200 MG tablet Take 1 tablet (200 mg total) by mouth 3 (three) times daily. For blood pressure. Note change in dose. 09/09/12   Pattricia Boss, MD  loratadine (CLARITIN) 10 MG tablet Take 10 mg by mouth daily as needed for allergies.    Historical Provider, MD  metFORMIN (GLUCOPHAGE) 500 MG tablet Take 1 tablet (500 mg total) by mouth 2 (two) times daily. 10/17/13   Hazel Sams, PA-C  methocarbamol (ROBAXIN) 500 MG tablet Take 1 tablet (500 mg total) by mouth 2 (two) times daily. 04/13/14   Domenic Moras, PA-C  nortriptyline (PAMELOR) 25 MG capsule Take 1 capsule (25 mg total) by mouth at bedtime. For neuropathy and insomnia 09/09/12   Pattricia Boss, MD  pantoprazole (PROTONIX) 40 MG tablet Take 40 mg by mouth 2 (two) times daily before a meal.    Historical Provider, MD  pravastatin (PRAVACHOL) 40 MG tablet Take 2 tablets (80 mg total) by mouth at bedtime. 09/09/12   Pattricia Boss, MD  pravastatin (PRAVACHOL) 80 MG tablet Take 80 mg by mouth at bedtime.    Historical Provider, MD  predniSONE (DELTASONE) 20 MG tablet Take 2 tablets (40 mg total) by mouth daily. 11/23/14   Waynetta Pean, PA-C  sertraline (ZOLOFT) 100 MG tablet Take 100 mg by mouth daily.    Historical Provider, MD  sildenafil (VIAGRA) 100 MG tablet Take 100 mg by mouth as needed for erectile dysfunction. Take 1 hour prior to sexual activity-- do not exceed 1 dose per 24 hour period.    Historical Provider, MD  spironolactone-hydrochlorothiazide (ALDACTAZIDE) 25-25 MG per tablet Take 1 tablet by mouth daily.    Historical Provider, MD  triamcinolone cream (KENALOG) 0.1 % Apply 1 application topically daily.    Historical Provider, MD   Physical Exam: Filed Vitals:   11/29/14 1737 11/29/14 1945 11/29/14 1952 11/29/14 2000  BP: 147/73 155/99 155/99 156/86  Pulse: 84 67 65 71  Temp: 98.2 F (36.8 C)  98.4 F (36.9 C)   TempSrc: Oral  Oral   Resp: 16  16   SpO2: 97% 91% 100% 100%    Wt Readings from Last 3 Encounters:    10/17/13 81.647 kg (180 lb)  09/01/12 83.7 kg (184 lb 8.4 oz)  05/22/12 77.5 kg (170 lb 13.7 oz)    General:  Appears calm and comfortable Eyes: PERRL, normal lids, irises & conjunctiva ENT: grossly normal hearing, lips & tongue Neck: no LAD, masses or thyromegaly Cardiovascular: RRR, no m/r/g. No LE edema. Respiratory: CTA bilaterally, no w/r/r. Normal respiratory effort. Abdomen: soft, ntnd Skin: no rash or induration seen on limited exam Musculoskeletal: grossly normal tone BUE/BLE Psychiatric: grossly normal mood and affect, speech fluent and appropriate Neurologic: grossly non-focal.          Labs on Admission:  Basic Metabolic Panel:  Recent Labs Lab 11/29/14 1747  NA 121*  K 5.1  CL 85*  CO2 24  GLUCOSE 926*  BUN 60*  CREATININE 3.30*  CALCIUM 9.4   Liver Function Tests:  Recent Labs Lab 11/29/14 1747  AST 17  ALT 14  ALKPHOS 172*  BILITOT 0.6  PROT 8.6*  ALBUMIN 4.0   No results for input(s): LIPASE, AMYLASE in the last 168 hours. No results for input(s): AMMONIA in the last 168 hours. CBC:  Recent Labs Lab 11/29/14 1747  WBC 10.4  NEUTROABS 9.0*  HGB 14.3  HCT 39.7  MCV 82.9  PLT 279   Cardiac Enzymes: No results for input(s): CKTOTAL, CKMB, CKMBINDEX, TROPONINI in the last 168 hours.  BNP (last 3 results) No results for input(s): BNP in the last 8760 hours.  ProBNP (last 3 results) No results for input(s): PROBNP in the last 8760 hours.  CBG:  Recent Labs Lab 11/29/14 1745 11/29/14 1958  GLUCAP >600* >600*    Radiological Exams on Admission: No results found.    Assessment/Plan Active Problems:   Stage 3 chronic renal impairment associated with type 2 diabetes mellitus   Type 2 diabetes mellitus   Hyperlipidemia   Hyperglycemia   1. Diabetes Mellitus with Hyperglycemia secondary to steroids -he will be started on insulin drip and admitted to the step down unit -will monitor labs electrolytes -check magnesium and  phosphorus -aggressive fluid resucitation -Will check A1C  2. Stage 3 CKD associated with DM -creatinine elevated from last results in January likely dehydration -will continue with fluids -monitor labs  3. Hyperlipidemia -will continue with statins -monitor labs  4. Hyponatremia -secondary to hyperglycemia -will monitor labs     Code Status: Full Code (must indicate code status--if unknown or must be presumed, indicate so) DVT Prophylaxis:Heparin Family Communication: None (indicate person spoken with, if applicable, with phone number if by telephone) Disposition Plan: Home (indicate anticipated LOS)  Time spent: 72min  Leyna Vanderkolk A [facility] Pager [phone]

## 2014-11-29 NOTE — ED Notes (Signed)
MD at bedside. 

## 2014-11-30 ENCOUNTER — Inpatient Hospital Stay (HOSPITAL_COMMUNITY): Payer: Medicare Other

## 2014-11-30 LAB — CBC
HEMATOCRIT: 34.2 % — AB (ref 39.0–52.0)
Hemoglobin: 12.4 g/dL — ABNORMAL LOW (ref 13.0–17.0)
MCH: 30.6 pg (ref 26.0–34.0)
MCHC: 36.3 g/dL — AB (ref 30.0–36.0)
MCV: 84.4 fL (ref 78.0–100.0)
Platelets: 367 10*3/uL (ref 150–400)
RBC: 4.05 MIL/uL — AB (ref 4.22–5.81)
RDW: 12.1 % (ref 11.5–15.5)
WBC: 11.6 10*3/uL — AB (ref 4.0–10.5)

## 2014-11-30 LAB — GLUCOSE, CAPILLARY
GLUCOSE-CAPILLARY: 136 mg/dL — AB (ref 70–99)
GLUCOSE-CAPILLARY: 203 mg/dL — AB (ref 70–99)
GLUCOSE-CAPILLARY: 262 mg/dL — AB (ref 70–99)
Glucose-Capillary: 217 mg/dL — ABNORMAL HIGH (ref 70–99)
Glucose-Capillary: 238 mg/dL — ABNORMAL HIGH (ref 70–99)
Glucose-Capillary: 141 mg/dL — ABNORMAL HIGH (ref 70–99)
Glucose-Capillary: 84 mg/dL (ref 70–99)
Glucose-Capillary: 93 mg/dL (ref 70–99)
Glucose-Capillary: 106 mg/dL — ABNORMAL HIGH (ref 70–99)
Glucose-Capillary: 121 mg/dL — ABNORMAL HIGH (ref 70–99)
Glucose-Capillary: 282 mg/dL — ABNORMAL HIGH (ref 70–99)

## 2014-11-30 LAB — BASIC METABOLIC PANEL
ANION GAP: 11 (ref 5–15)
ANION GAP: 8 (ref 5–15)
BUN: 49 mg/dL — ABNORMAL HIGH (ref 6–23)
BUN: 46 mg/dL — AB (ref 6–23)
CO2: 23 mmol/L (ref 19–32)
CO2: 21 mmol/L (ref 19–32)
CREATININE: 2.7 mg/dL — AB (ref 0.50–1.35)
Calcium: 9.2 mg/dL (ref 8.4–10.5)
Calcium: 8.7 mg/dL (ref 8.4–10.5)
Chloride: 99 mmol/L (ref 96–112)
Chloride: 103 mmol/L (ref 96–112)
Creatinine, Ser: 2.67 mg/dL — ABNORMAL HIGH (ref 0.50–1.35)
GFR calc non Af Amer: 25 mL/min — ABNORMAL LOW (ref >90)
GFR, EST AFRICAN AMERICAN: 28 mL/min — AB (ref >90)
GFR, EST AFRICAN AMERICAN: 29 mL/min — AB (ref >90)
GFR, EST NON AFRICAN AMERICAN: 25 mL/min — AB (ref >90)
GLUCOSE: 177 mg/dL — AB (ref 70–99)
Glucose, Bld: 162 mg/dL — ABNORMAL HIGH (ref 70–99)
POTASSIUM: 4.1 mmol/L (ref 3.5–5.1)
Potassium: 3.6 mmol/L (ref 3.5–5.1)
SODIUM: 133 mmol/L — AB (ref 135–145)
Sodium: 132 mmol/L — ABNORMAL LOW (ref 135–145)

## 2014-11-30 LAB — MRSA PCR SCREENING: MRSA by PCR: NEGATIVE

## 2014-11-30 LAB — MAGNESIUM: Magnesium: 2.5 mg/dL (ref 1.5–2.5)

## 2014-11-30 LAB — PHOSPHORUS: PHOSPHORUS: 5.2 mg/dL — AB (ref 2.3–4.6)

## 2014-11-30 MED ORDER — INSULIN ASPART 100 UNIT/ML ~~LOC~~ SOLN
0.0000 [IU] | Freq: Three times a day (TID) | SUBCUTANEOUS | Status: DC
  Administered 2014-11-30: 5 [IU] via SUBCUTANEOUS

## 2014-11-30 MED ORDER — INSULIN GLARGINE 100 UNIT/ML ~~LOC~~ SOLN
12.0000 [IU] | Freq: Every day | SUBCUTANEOUS | Status: DC
  Administered 2014-11-30: 12 [IU] via SUBCUTANEOUS
  Filled 2014-11-30: qty 0.12

## 2014-11-30 MED ORDER — GUAIFENESIN-DM 100-10 MG/5ML PO SYRP
5.0000 mL | ORAL_SOLUTION | ORAL | Status: DC | PRN
  Filled 2014-11-30: qty 5

## 2014-11-30 MED ORDER — INSULIN ASPART 100 UNIT/ML ~~LOC~~ SOLN: 3.0000 [IU] | Freq: Three times a day (TID) | SUBCUTANEOUS | Status: DC

## 2014-11-30 NOTE — Discharge Instructions (Signed)
Accuchecks 4 times/day, Once in AM empty stomach and then before each meal. Log in all results and show them to your primary doctor in 3-5 days. If any glucose reading is under 80 or above 300 call your primary doctor immediately.  Follow with Pcp Not In System in 5-7 days  Please get a complete blood count and chemistry panel checked by your Primary MD at your next visit, and again as instructed by your Primary MD. Please get your medications reviewed and adjusted by your Primary MD.  Please request your Primary MD to go over all Hospital Tests and Procedure/Radiological results at the follow up, please get all Hospital records sent to your Prim MD by signing hospital release before you go home.  If you had Pneumonia of Lung problems at the Hospital: Please get a 2 view Chest X ray done in 6-8 weeks after hospital discharge or sooner if instructed by your Primary MD.  If you have Congestive Heart Failure: Please call your Cardiologist or Primary MD anytime you have any of the following symptoms:  1) 3 pound weight gain in 24 hours or 5 pounds in 1 week  2) shortness of breath, with or without a dry hacking cough  3) swelling in the hands, feet or stomach  4) if you have to sleep on extra pillows at night in order to breathe  Follow cardiac low salt diet and 1.5 lit/day fluid restriction.  If you have diabetes Accuchecks 4 times/day, Once in AM empty stomach and then before each meal. Log in all results and show them to your primary doctor at your next visit. If any glucose reading is under 80 or above 300 call your primary MD immediately.  If you have Seizure/Convulsions/Epilepsy: Please do not drive, operate heavy machinery, participate in activities at heights or participate in high speed sports until you have seen by Primary MD or a Neurologist and advised to do so again.  If you had Gastrointestinal Bleeding: Please ask your Primary MD to check a complete blood count within one  week of discharge or at your next visit. Your endoscopic/colonoscopic biopsies that are pending at the time of discharge, will also need to followed by your Primary MD.  Get Medicines reviewed and adjusted. Please take all your medications with you for your next visit with your Primary MD  Please request your Primary MD to go over all hospital tests and procedure/radiological results at the follow up, please ask your Primary MD to get all Hospital records sent to his/her office.  If you experience worsening of your admission symptoms, develop shortness of breath, life threatening emergency, suicidal or homicidal thoughts you must seek medical attention immediately by calling 911 or calling your MD immediately  if symptoms less severe.  You must read complete instructions/literature along with all the possible adverse reactions/side effects for all the Medicines you take and that have been prescribed to you. Take any new Medicines after you have completely understood and accpet all the possible adverse reactions/side effects.   Do not drive or operate heavy machinery when taking Pain medications.   Do not take more than prescribed Pain, Sleep and Anxiety Medications  Special Instructions: If you have smoked or chewed Tobacco  in the last 2 yrs please stop smoking, stop any regular Alcohol  and or any Recreational drug use.  Wear Seat belts while driving.  Please note You were cared for by a hospitalist during your hospital stay. If you have any questions about  your discharge medications or the care you received while you were in the hospital after you are discharged, you can call the unit and asked to speak with the hospitalist on call if the hospitalist that took care of you is not available. Once you are discharged, your primary care physician will handle any further medical issues. Please note that NO REFILLS for any discharge medications will be authorized once you are discharged, as it is  imperative that you return to your primary care physician (or establish a relationship with a primary care physician if you do not have one) for your aftercare needs so that they can reassess your need for medications and monitor your lab values.  You can reach the hospitalist office at phone [phone] or fax [phone]   If you do not have a primary care physician, you can call [phone] for a physician referral.  Activity: As tolerated with Full fall precautions use walker/cane & assistance as needed  Diet: diabetic  Disposition Home

## 2014-11-30 NOTE — Progress Notes (Signed)
Pt has been discharged. Pt has been in no distress. Pt's IV removed and tip intact and pt tolerated well. Nurse reviewed information on blood glucose levels and signs and symptoms and pt stated he understood. Nurse emphasized pt needs to follow up with his [place] doctor within one week. Nurse educated pt that if his blood sugar gets too high and he starts to have signs and symptoms of hyperglycemia that he needs to go to emergency.  Nurse emphasized to pt that he needs to check his sugar before meals prior to giving insulin per MD orders and pt stated he understood.  Pt had no nausea or vomiting all day and tolerated meals very well. Pt drove himself to hospital and pt stated he was driving home. Nurse stated to patient he should not drive if at all possible when his sugar is high and he has signs and symptoms of hyperglycemia and pt stated he understood. Nurse reviewed discharge papers with pt and pt stated he ;had no questions. Pt is leaving now with nurse assistant to go home.

## 2014-11-30 NOTE — Discharge Summary (Signed)
Physician Discharge Summary  Richard [name] [id number] DOB: June 01, 1956 DOA: 11/29/2014  PCP: Pcp Not In System  Admit date: 11/29/2014 Discharge date: 11/30/2014  Time spent: 45 minutes  Recommendations for Outpatient Follow-up:  1. [facility]  2. CBGs monitoring 4 times daily 3. Mealtime insulin was added to patient's regimen   Discharge Diagnoses:  Active Problems:   Stage 3 chronic renal impairment associated with type 2 diabetes mellitus   Type 2 diabetes mellitus   Hyperlipidemia   Hyperglycemia  Discharge Condition: stable  Diet recommendation: diabetic  Filed Weights   11/29/14 2325  Weight: 81.7 kg (180 lb 1.9 oz)   History of present illness:  Richard [name] is a 59 y.o. male presents with hyperglycemia. He states that he had some bronchitis for about a week. He went to the [facility] and was given antibiotics and also steroids. Patient states that his sugars have been running high since that time. Patient also was given some robitussin. He states that his glucose was running in the 700 range. He states that he went to the [facility] in [place] and was told to come the ED. On presentation here his glucose was 926. Patient denies fevers or chills. He has some cough whjich is improving. He has noted a dry mouth and also going to the bathroom frequently. Patient states no abdominal complaints. He states that he has no diarrhea and no nausea or vomiting.  Hospital Course:  Patient was admitted to the hospital with hyperglycemia without acidosis in the setting of recent sterids use for his upper respiratory infection. He was placed on an insulin gtt and his CBGs were controlled overnight. He was placed on his home Lantus and was allowed a diet. He was able to tolerate that well. His lunch time CBG was elevated which suggests he needs mealtime coverage, which was started at 3 U / meal. Patient extremely anxious to be discharged today as he has personal  things he needs to care for, I advised him to check his CBGs 4 times daily, create a log and to see [facility] in [facility] week. I discontinued his steroids on discharge. Patient expressed understanding. He has underlying stage III-IV CKD which has improved with hydration and he is at his baseline. He was discharged home in stable condition.   Procedures:  None    Consultations:  None   Discharge Exam: Filed Vitals:   11/30/14 0843 11/30/14 0900 11/30/14 1000 11/30/14 1003  BP: 112/49 123/49 157/74 157/74  Pulse: 69 65 71   Temp: 97.9 F (36.6 C)     TempSrc: Oral     Resp: 14 16 16    Height:      Weight:      SpO2: 98% 98% 99%    General: NAD Cardiovascular: RRR Respiratory: CTA biL  Discharge Instructions     Medication List    STOP taking these medications        azithromycin 250 MG tablet  Commonly known as:  ZITHROMAX Z-PAK     ibuprofen 800 MG tablet  Commonly known as:  ADVIL,MOTRIN     metFORMIN 500 MG tablet  Commonly known as:  GLUCOPHAGE     predniSONE 20 MG tablet  Commonly known as:  DELTASONE      TAKE these medications        amLODipine 5 MG tablet  Commonly known as:  NORVASC  Take 1 tablet (5 mg total) by mouth  daily.     clopidogrel 75 MG tablet  Commonly known as:  PLAVIX  Take 1 tablet (75 mg total) by mouth daily with breakfast.     glipiZIDE 5 MG 24 hr tablet  Commonly known as:  GLUCOTROL XL  Take 1 tablet (5 mg total) by mouth daily with breakfast. For diabetes     guaiFENesin 100 MG/5ML liquid  Commonly known as:  ROBITUSSIN  Take 5-10 mLs (100-200 mg total) by mouth every 4 (four) hours as needed for cough.     hydrALAZINE 10 MG tablet  Commonly known as:  APRESOLINE  Take 1 tablet (10 mg total) by mouth 3 (three) times daily.     HYDROcodone-acetaminophen 5-325 MG per tablet  Commonly known as:  NORCO/VICODIN  Take 1 tablet by mouth every 6 (six) hours as needed for moderate pain or severe pain.     insulin aspart 100  UNIT/ML injection  Commonly known as:  NOVOLOG  Inject 3 Units into the skin 3 (three) times daily with meals.     insulin glargine 100 unit/mL Sopn  Commonly known as:  LANTUS  Inject 12 Units into the skin daily.     labetalol 200 MG tablet  Commonly known as:  NORMODYNE  Take 1 tablet (200 mg total) by mouth 3 (three) times daily. For blood pressure. Note change in dose.     loratadine 10 MG tablet  Commonly known as:  CLARITIN  Take 10 mg by mouth daily as needed for allergies.     methocarbamol 500 MG tablet  Commonly known as:  ROBAXIN  Take 1 tablet (500 mg total) by mouth 2 (two) times daily.     nortriptyline 25 MG capsule  Commonly known as:  PAMELOR  Take 1 capsule (25 mg total) by mouth at bedtime. For neuropathy and insomnia     pantoprazole 40 MG tablet  Commonly known as:  PROTONIX  Take 40 mg by mouth 2 (two) times daily before a meal.     pravastatin 80 MG tablet  Commonly known as:  PRAVACHOL  Take 80 mg by mouth at bedtime.     sertraline 100 MG tablet  Commonly known as:  ZOLOFT  Take 100 mg by mouth daily.     sildenafil 100 MG tablet  Commonly known as:  VIAGRA  Take 100 mg by mouth as needed for erectile dysfunction. Take 1 hour prior to sexual activity-- do not exceed 1 dose per 24 hour period.     spironolactone-hydrochlorothiazide 25-25 MG per tablet  Commonly known as:  ALDACTAZIDE  Take 1 tablet by mouth daily.     triamcinolone cream 0.1 %  Commonly known as:  KENALOG  Apply 1 application topically daily.     Vitamin D 2000 UNITS Caps  Take 1 capsule by mouth daily.           Follow-up Information    [facility] with [facility] In 1 week.      The results of significant diagnostics from this hospitalization (including imaging, microbiology, ancillary and laboratory) are listed below for reference.    Significant Diagnostic Studies: Dg Chest 2 View (if Patient Has Fever And/or Copd)  11/23/2014   CLINICAL DATA:  59 year old male with  shortness of breath and productive cough for 1 week, with mild chest discomfort.  EXAM: CHEST  2 VIEW  COMPARISON:  Chest x-ray 02/01/2014.  FINDINGS: Lung volumes are normal. No consolidative airspace disease. No pleural effusions. No pneumothorax. No  pulmonary nodule or mass noted. Pulmonary vasculature and the cardiomediastinal silhouette are within normal limits. Prominent bilateral nipple shadows unchanged compared to the prior examination.  IMPRESSION: No radiographic evidence of acute cardiopulmonary disease.   Electronically Signed   By: Vinnie Langton M.D.   On: 11/23/2014 13:18   Dg Chest Port 1 View  11/30/2014   CLINICAL DATA:  Cough  EXAM: PORTABLE CHEST - 1 VIEW  COMPARISON:  Chest x-ray of 11/23/2014  FINDINGS: No focal infiltrate or effusion is seen. Mediastinal and hilar contours are unremarkable. The heart is within normal limits in size. No bony abnormality is seen.  IMPRESSION: No active disease.   Electronically Signed   By: Ivar Drape M.D.   On: 11/30/2014 07:25    Microbiology: Recent Results (from the past 240 hour(s))  MRSA PCR Screening     Status: None   Collection Time: 11/30/14 12:01 AM  Result Value Ref Range Status   MRSA by PCR NEGATIVE NEGATIVE Final    Comment:        The GeneXpert MRSA Assay (FDA approved for NASAL specimens only), is one component of a comprehensive MRSA colonization surveillance program. It is not intended to diagnose MRSA infection nor to guide or monitor treatment for MRSA infections.     Labs: Basic Metabolic Panel:  Recent Labs Lab 11/29/14 1747 11/30/14 0145 11/30/14 0514  NA 121* 133* 132*  K 5.1 4.1 3.6  CL 85* 99 103  CO2 24 23 21   GLUCOSE 926* 177* 162*  BUN 60* 49* 46*  CREATININE 3.30* 2.70* 2.67*  CALCIUM 9.4 9.2 8.7  MG  --  2.5  --   PHOS  --  5.2*  --    Liver Function Tests:  Recent Labs Lab 11/29/14 1747  AST 17  ALT 14  ALKPHOS 172*  BILITOT 0.6  PROT 8.6*  ALBUMIN 4.0   CBC:  Recent  Labs Lab 11/29/14 1747 11/30/14 0145  WBC 10.4 11.6*  NEUTROABS 9.0*  --   HGB 14.3 12.4*  HCT 39.7 34.2*  MCV 82.9 84.4  PLT 279 367   CBG:  Recent Labs Lab 11/30/14 0635 11/30/14 0744 11/30/14 0843 11/30/14 0948 11/30/14 1237  GLUCAP 84 93 106* 121* 282*     Signed:  Aulani Shipton  [facility] 11/30/2014, 5:17 PM

## 2014-12-05 LAB — HEMOGLOBIN A1C
HEMOGLOBIN A1C: 6 % — AB (ref 4.8–5.6)
Mean Plasma Glucose: 126 mg/dL

## 2014-12-06 LAB — CULTURE, BLOOD (ROUTINE X 2)
CULTURE: NO GROWTH
Culture: NO GROWTH

## 2015-05-03 DIAGNOSIS — E139 Other specified diabetes mellitus without complications: Secondary | ICD-10-CM | POA: Diagnosis not present

## 2015-05-03 DIAGNOSIS — M129 Arthropathy, unspecified: Secondary | ICD-10-CM | POA: Diagnosis not present

## 2015-05-03 DIAGNOSIS — Z8673 Personal history of transient ischemic attack (TIA), and cerebral infarction without residual deficits: Secondary | ICD-10-CM | POA: Diagnosis not present

## 2015-05-03 DIAGNOSIS — N521 Erectile dysfunction due to diseases classified elsewhere: Secondary | ICD-10-CM | POA: Diagnosis not present

## 2015-05-21 DIAGNOSIS — E139 Other specified diabetes mellitus without complications: Secondary | ICD-10-CM | POA: Diagnosis not present

## 2015-05-21 DIAGNOSIS — M129 Arthropathy, unspecified: Secondary | ICD-10-CM | POA: Diagnosis not present

## 2015-05-21 DIAGNOSIS — N521 Erectile dysfunction due to diseases classified elsewhere: Secondary | ICD-10-CM | POA: Diagnosis not present

## 2015-05-21 DIAGNOSIS — Z8673 Personal history of transient ischemic attack (TIA), and cerebral infarction without residual deficits: Secondary | ICD-10-CM | POA: Diagnosis not present

## 2015-06-12 DIAGNOSIS — M129 Arthropathy, unspecified: Secondary | ICD-10-CM | POA: Diagnosis not present

## 2015-06-12 DIAGNOSIS — Z6826 Body mass index (BMI) 26.0-26.9, adult: Secondary | ICD-10-CM | POA: Diagnosis not present

## 2015-06-12 DIAGNOSIS — E119 Type 2 diabetes mellitus without complications: Secondary | ICD-10-CM | POA: Diagnosis not present

## 2015-06-12 DIAGNOSIS — N521 Erectile dysfunction due to diseases classified elsewhere: Secondary | ICD-10-CM | POA: Diagnosis not present

## 2015-07-03 DIAGNOSIS — H68103 Unspecified obstruction of Eustachian tube, bilateral: Secondary | ICD-10-CM | POA: Diagnosis not present

## 2015-07-03 DIAGNOSIS — N529 Male erectile dysfunction, unspecified: Secondary | ICD-10-CM | POA: Diagnosis not present

## 2015-07-03 DIAGNOSIS — Z6827 Body mass index (BMI) 27.0-27.9, adult: Secondary | ICD-10-CM | POA: Diagnosis not present

## 2015-07-03 DIAGNOSIS — N521 Erectile dysfunction due to diseases classified elsewhere: Secondary | ICD-10-CM | POA: Diagnosis not present

## 2015-07-03 DIAGNOSIS — M25519 Pain in unspecified shoulder: Secondary | ICD-10-CM | POA: Diagnosis not present

## 2015-12-30 ENCOUNTER — Emergency Department (HOSPITAL_COMMUNITY): Payer: Medicare HMO

## 2015-12-30 ENCOUNTER — Encounter (HOSPITAL_COMMUNITY): Payer: Self-pay | Admitting: Emergency Medicine

## 2015-12-30 ENCOUNTER — Emergency Department (HOSPITAL_COMMUNITY)
Admission: EM | Admit: 2015-12-30 | Discharge: 2015-12-30 | Disposition: A | Discharge status: Home / Self Care | Payer: Medicare HMO | Attending: Emergency Medicine | Admitting: Emergency Medicine

## 2015-12-30 DIAGNOSIS — E119 Type 2 diabetes mellitus without complications: Secondary | ICD-10-CM | POA: Diagnosis not present

## 2015-12-30 DIAGNOSIS — R0602 Shortness of breath: Secondary | ICD-10-CM | POA: Diagnosis not present

## 2015-12-30 DIAGNOSIS — R05 Cough: Secondary | ICD-10-CM | POA: Insufficient documentation

## 2015-12-30 DIAGNOSIS — F17210 Nicotine dependence, cigarettes, uncomplicated: Secondary | ICD-10-CM | POA: Insufficient documentation

## 2015-12-30 DIAGNOSIS — I10 Essential (primary) hypertension: Secondary | ICD-10-CM | POA: Insufficient documentation

## 2015-12-30 DIAGNOSIS — R0981 Nasal congestion: Secondary | ICD-10-CM | POA: Diagnosis present

## 2015-12-30 NOTE — ED Notes (Addendum)
Patient called for the second time and no answer

## 2015-12-30 NOTE — ED Notes (Signed)
Patient has been called for the Third time.

## 2015-12-30 NOTE — ED Notes (Signed)
Pt states that he has had a productive cough with chest congestion x 3 weeks. Sneezing. States it started when the pollen hit. Hypertensive but has not taken night HTN meds yet. Alert and oriented.

## 2015-12-30 NOTE — ED Notes (Signed)
Patient has been called to be transferred to a room.

## 2015-12-30 NOTE — ED Notes (Signed)
Patient called for the last time and no answer.

## 2016-01-07 ENCOUNTER — Emergency Department (HOSPITAL_COMMUNITY): Payer: Medicare HMO

## 2016-01-07 ENCOUNTER — Emergency Department (EMERGENCY_DEPARTMENT_HOSPITAL)
Admit: 2016-01-07 | Discharge: 2016-01-07 | Disposition: A | Discharge status: Home / Self Care | Payer: Medicare HMO | Attending: Emergency Medicine | Admitting: Emergency Medicine

## 2016-01-07 ENCOUNTER — Encounter (HOSPITAL_COMMUNITY): Payer: Self-pay | Admitting: *Deleted

## 2016-01-07 ENCOUNTER — Emergency Department (HOSPITAL_COMMUNITY)
Admission: EM | Admit: 2016-01-07 | Discharge: 2016-01-07 | Disposition: A | Discharge status: Home / Self Care | Payer: Medicare HMO | Attending: Emergency Medicine | Admitting: Emergency Medicine

## 2016-01-07 DIAGNOSIS — W01110A Fall on same level from slipping, tripping and stumbling with subsequent striking against sharp glass, initial encounter: Secondary | ICD-10-CM | POA: Diagnosis not present

## 2016-01-07 DIAGNOSIS — S71112A Laceration without foreign body, left thigh, initial encounter: Secondary | ICD-10-CM | POA: Insufficient documentation

## 2016-01-07 DIAGNOSIS — Z794 Long term (current) use of insulin: Secondary | ICD-10-CM | POA: Insufficient documentation

## 2016-01-07 DIAGNOSIS — Y9301 Activity, walking, marching and hiking: Secondary | ICD-10-CM | POA: Insufficient documentation

## 2016-01-07 DIAGNOSIS — Z8673 Personal history of transient ischemic attack (TIA), and cerebral infarction without residual deficits: Secondary | ICD-10-CM | POA: Diagnosis not present

## 2016-01-07 DIAGNOSIS — G43909 Migraine, unspecified, not intractable, without status migrainosus: Secondary | ICD-10-CM | POA: Diagnosis not present

## 2016-01-07 DIAGNOSIS — R52 Pain, unspecified: Secondary | ICD-10-CM | POA: Diagnosis not present

## 2016-01-07 DIAGNOSIS — I10 Essential (primary) hypertension: Secondary | ICD-10-CM | POA: Diagnosis not present

## 2016-01-07 DIAGNOSIS — Z7902 Long term (current) use of antithrombotics/antiplatelets: Secondary | ICD-10-CM | POA: Diagnosis not present

## 2016-01-07 DIAGNOSIS — Z79899 Other long term (current) drug therapy: Secondary | ICD-10-CM | POA: Insufficient documentation

## 2016-01-07 DIAGNOSIS — T1490XA Injury, unspecified, initial encounter: Secondary | ICD-10-CM

## 2016-01-07 DIAGNOSIS — F17210 Nicotine dependence, cigarettes, uncomplicated: Secondary | ICD-10-CM | POA: Insufficient documentation

## 2016-01-07 DIAGNOSIS — S7012XA Contusion of left thigh, initial encounter: Secondary | ICD-10-CM | POA: Insufficient documentation

## 2016-01-07 DIAGNOSIS — Y998 Other external cause status: Secondary | ICD-10-CM | POA: Diagnosis not present

## 2016-01-07 DIAGNOSIS — Z7984 Long term (current) use of oral hypoglycemic drugs: Secondary | ICD-10-CM | POA: Insufficient documentation

## 2016-01-07 DIAGNOSIS — Z23 Encounter for immunization: Secondary | ICD-10-CM | POA: Diagnosis not present

## 2016-01-07 DIAGNOSIS — M1990 Unspecified osteoarthritis, unspecified site: Secondary | ICD-10-CM | POA: Diagnosis not present

## 2016-01-07 DIAGNOSIS — E119 Type 2 diabetes mellitus without complications: Secondary | ICD-10-CM | POA: Insufficient documentation

## 2016-01-07 DIAGNOSIS — K219 Gastro-esophageal reflux disease without esophagitis: Secondary | ICD-10-CM | POA: Insufficient documentation

## 2016-01-07 DIAGNOSIS — Z7952 Long term (current) use of systemic steroids: Secondary | ICD-10-CM | POA: Insufficient documentation

## 2016-01-07 DIAGNOSIS — Y9289 Other specified places as the place of occurrence of the external cause: Secondary | ICD-10-CM | POA: Diagnosis not present

## 2016-01-07 DIAGNOSIS — IMO0002 Reserved for concepts with insufficient information to code with codable children: Secondary | ICD-10-CM

## 2016-01-07 LAB — I-STAT CHEM 8, ED
BUN: 26 mg/dL — ABNORMAL HIGH (ref 6–20)
CALCIUM ION: 1.07 mmol/L — AB (ref 1.12–1.23)
Chloride: 103 mmol/L (ref 101–111)
Creatinine, Ser: 2.6 mg/dL — ABNORMAL HIGH (ref 0.61–1.24)
GLUCOSE: 196 mg/dL — AB (ref 65–99)
HCT: 46 % (ref 39.0–52.0)
Hemoglobin: 15.6 g/dL (ref 13.0–17.0)
Potassium: 3.7 mmol/L (ref 3.5–5.1)
Sodium: 140 mmol/L (ref 135–145)
TCO2: 23 mmol/L (ref 0–100)

## 2016-01-07 LAB — CBC WITH DIFFERENTIAL/PLATELET
Basophils Absolute: 0 10*3/uL (ref 0.0–0.1)
Basophils Relative: 0 %
EOS PCT: 1 %
Eosinophils Absolute: 0.1 10*3/uL (ref 0.0–0.7)
HCT: 42.6 % (ref 39.0–52.0)
HEMOGLOBIN: 15.2 g/dL (ref 13.0–17.0)
LYMPHS ABS: 1.4 10*3/uL (ref 0.7–4.0)
LYMPHS PCT: 12 %
MCH: 31.7 pg (ref 26.0–34.0)
MCHC: 35.7 g/dL (ref 30.0–36.0)
MCV: 88.9 fL (ref 78.0–100.0)
Monocytes Absolute: 1.1 10*3/uL — ABNORMAL HIGH (ref 0.1–1.0)
Monocytes Relative: 9 %
Neutro Abs: 9.8 10*3/uL — ABNORMAL HIGH (ref 1.7–7.7)
Neutrophils Relative %: 78 %
PLATELETS: 210 10*3/uL (ref 150–400)
RBC: 4.79 MIL/uL (ref 4.22–5.81)
RDW: 13.1 % (ref 11.5–15.5)
WBC: 12.4 10*3/uL — ABNORMAL HIGH (ref 4.0–10.5)

## 2016-01-07 MED ORDER — CLINDAMYCIN HCL 300 MG PO CAPS: 300.0000 mg | ORAL_CAPSULE | Freq: Three times a day (TID) | ORAL | Status: DC

## 2016-01-07 MED ORDER — FENTANYL CITRATE (PF) 100 MCG/2ML IJ SOLN
50.0000 ug | Freq: Once | INTRAMUSCULAR | Status: AC
  Administered 2016-01-07: 50 ug via INTRAVENOUS
  Filled 2016-01-07: qty 2

## 2016-01-07 MED ORDER — MORPHINE SULFATE (PF) 4 MG/ML IV SOLN
4.0000 mg | Freq: Once | INTRAVENOUS | Status: AC
  Administered 2016-01-07: 4 mg via INTRAVENOUS
  Filled 2016-01-07: qty 1

## 2016-01-07 MED ORDER — LIDOCAINE-EPINEPHRINE-TETRACAINE (LET) SOLUTION
3.0000 mL | Freq: Once | NASAL | Status: AC
  Administered 2016-01-07: 3 mL via TOPICAL
  Filled 2016-01-07: qty 3

## 2016-01-07 MED ORDER — TETANUS-DIPHTH-ACELL PERTUSSIS 5-2.5-18.5 LF-MCG/0.5 IM SUSP
0.5000 mL | Freq: Once | INTRAMUSCULAR | Status: AC
  Administered 2016-01-07: 0.5 mL via INTRAMUSCULAR
  Filled 2016-01-07: qty 0.5

## 2016-01-07 MED ORDER — LIDOCAINE HCL (PF) 1 % IJ SOLN
5.0000 mL | Freq: Once | INTRAMUSCULAR | Status: DC
  Filled 2016-01-07: qty 5

## 2016-01-07 MED ORDER — HYDROCODONE-ACETAMINOPHEN 5-325 MG PO TABS
1.0000 | ORAL_TABLET | Freq: Four times a day (QID) | ORAL | Status: DC | PRN
Start: 2016-01-07 — End: 2017-03-18

## 2016-01-07 NOTE — ED Notes (Signed)
EMS called to home.  Found patient outside post fall with a laceration to the left inner thigh area. Bleeding controlled on arrival to the ED.  Notable swelling around area.  Patient states that he fell On a bottle.while walking in grass.  Unable to remember exactly last Tdap.  Currently patient  Reports 10 of 10 pain .

## 2016-01-07 NOTE — ED Provider Notes (Addendum)
CSN: [id number]     Arrival date & time 01/07/16  0128 History   By signing my name below, I, Forrestine Him, attest that this documentation has been prepared under the direction and in the presence of Niala Stcharles, MD.  Electronically Signed: Forrestine Him, ED Scribe. 01/07/2016. 3:18 AM.   Chief Complaint  Patient presents with  . Extremity Laceration   Patient is a 60 y.o. male presenting with skin laceration. The history is provided by the patient. No language interpreter was used.  Laceration Location:  Leg Leg laceration location:  L upper leg Depth:  Through dermis Quality: straight   Bleeding: controlled   Time since incident:  3 hours Laceration mechanism:  Broken glass Pain details:    Quality:  Unable to specify   Severity:  Moderate   Timing:  Constant   Progression:  Unchanged Foreign body present:  No foreign bodies Relieved by:  Nothing Worsened by:  Pressure Ineffective treatments:  None tried Tetanus status:  Unknown   HPI Comments: Gioacchino Laessig is a 60 y.o. male with a PMHx of HTN, stroke, and DM who presents to the Emergency Department here after an extremity laceration to the L inner thigh sustained just prior to arrival. Pt states he was walking through grass when he slipped and fell landing on a broken bottle. No head trauma or LOC. He now c/o pain to the wound which is exacerbated with pressure to area. No alleviating factors. Currently he rates pain 10/10. No OTC medications or home remedies attempted prior to arrival. No recent fever, chills, nausea, vomiting. No weakness, loss of sensation, or numbness. No known allergies to medications.  Hematoma of medial proximal thigh  PCP: Pcp Not In System    Past Medical History  Diagnosis Date  . Hypertension   . Arthritis   . GERD (gastroesophageal reflux disease)   . Migraines   . Neuropathy ([place])   . Ruptured lumbar disc   . Diabetes mellitus     diet controlled  . Stroke [facility])    Past Surgical  History  Procedure Laterality Date  . Knee surgery      bil   History reviewed. No pertinent family history. Social History  Substance Use Topics  . Smoking status: Current Every Day Smoker -- 1.00 packs/day    Types: Cigarettes  . Smokeless tobacco: None  . Alcohol Use: No    Review of Systems  Constitutional: Negative for fever and chills.  Eyes: Negative for photophobia.  Respiratory: Negative for cough and shortness of breath.   Cardiovascular: Negative for chest pain.  Gastrointestinal: Negative for nausea, vomiting and abdominal pain.  Musculoskeletal: Positive for arthralgias.  Skin: Positive for wound.  Neurological: Negative for dizziness, facial asymmetry, speech difficulty, weakness, numbness and headaches.  Psychiatric/Behavioral: Negative for confusion.  All other systems reviewed and are negative.     Allergies  Review of patient's allergies indicates no known allergies.  Home Medications   Prior to Admission medications   Medication Sig Start Date End Date Taking? Authorizing Provider  amLODipine (NORVASC) 5 MG tablet Take 1 tablet (5 mg total) by mouth daily. 09/09/12   Pattricia Boss, MD  Cholecalciferol (VITAMIN D) 2000 UNITS CAPS Take 1 capsule by mouth daily.    Historical Provider, MD  clopidogrel (PLAVIX) 75 MG tablet Take 1 tablet (75 mg total) by mouth daily with breakfast. 09/09/12   Pattricia Boss, MD  glipiZIDE (GLUCOTROL XL) 5 MG 24 hr tablet Take 1 tablet (5 mg  total) by mouth daily with breakfast. For diabetes Patient taking differently: Take 5 mg by mouth 2 (two) times daily. For diabetes 09/09/12   Pattricia Boss, MD  guaiFENesin (ROBITUSSIN) 100 MG/5ML liquid Take 5-10 mLs (100-200 mg total) by mouth every 4 (four) hours as needed for cough. 11/23/14   Waynetta Pean, PA-C  hydrALAZINE (APRESOLINE) 10 MG tablet Take 1 tablet (10 mg total) by mouth 3 (three) times daily. 09/09/12   Pattricia Boss, MD  HYDROcodone-acetaminophen (NORCO/VICODIN) 5-325 MG  per tablet Take 1 tablet by mouth every 6 (six) hours as needed for moderate pain or severe pain. 04/13/14   Domenic Moras, PA-C  insulin aspart (NOVOLOG) 100 UNIT/ML injection Inject 3 Units into the skin 3 (three) times daily with meals. 11/30/14   Costin Karlyne Greenspan, MD  insulin glargine (LANTUS) 100 unit/mL SOPN Inject 12 Units into the skin daily.    Historical Provider, MD  labetalol (NORMODYNE) 200 MG tablet Take 1 tablet (200 mg total) by mouth 3 (three) times daily. For blood pressure. Note change in dose. 09/09/12   Pattricia Boss, MD  loratadine (CLARITIN) 10 MG tablet Take 10 mg by mouth daily as needed for allergies.    Historical Provider, MD  methocarbamol (ROBAXIN) 500 MG tablet Take 1 tablet (500 mg total) by mouth 2 (two) times daily. 04/13/14   Domenic Moras, PA-C  nortriptyline (PAMELOR) 25 MG capsule Take 1 capsule (25 mg total) by mouth at bedtime. For neuropathy and insomnia 09/09/12   Pattricia Boss, MD  pantoprazole (PROTONIX) 40 MG tablet Take 40 mg by mouth 2 (two) times daily before a meal.    Historical Provider, MD  pravastatin (PRAVACHOL) 80 MG tablet Take 80 mg by mouth at bedtime.    Historical Provider, MD  sertraline (ZOLOFT) 100 MG tablet Take 100 mg by mouth daily.    Historical Provider, MD  sildenafil (VIAGRA) 100 MG tablet Take 100 mg by mouth as needed for erectile dysfunction. Take 1 hour prior to sexual activity-- do not exceed 1 dose per 24 hour period.    Historical Provider, MD  spironolactone-hydrochlorothiazide (ALDACTAZIDE) 25-25 MG per tablet Take 1 tablet by mouth daily.    Historical Provider, MD  triamcinolone cream (KENALOG) 0.1 % Apply 1 application topically daily.    Historical Provider, MD   Triage Vitals: BP 135/79 mmHg  Pulse 68  Temp(Src) 98 F (36.7 C) (Oral)  Resp 18  SpO2 98%   Physical Exam  Constitutional: He is oriented to person, place, and time. He appears well-developed and well-nourished.  HENT:  Head: Normocephalic and atraumatic. Head  is without raccoon's eyes and without Battle's sign.  Right Ear: No hemotympanum.  Left Ear: No hemotympanum.  Mouth/Throat: Oropharynx is clear and moist. No oropharyngeal exudate.  Eyes: EOM are normal. Pupils are equal, round, and reactive to light.  Neck: Normal range of motion. Neck supple.  No point tenderness of the C spine Trachea is midline No carotid bruits   Cardiovascular: Normal rate, regular rhythm, normal heart sounds and intact distal pulses.   Pulses:      Dorsalis pedis pulses are 2+ on the right side, and 2+ on the left side.  Pulmonary/Chest: Effort normal and breath sounds normal. No respiratory distress. He has no wheezes. He has no rales.  Abdominal: Soft. Bowel sounds are normal. He exhibits no distension. There is no tenderness. There is no rebound and no guarding.  Musculoskeletal: Normal range of motion.  6 inch x 4 inch hematoma  over proximal inner R thigh 4.5 cm laceration to proximal R inner thigh   Neurological: He is alert and oriented to person, place, and time. He has normal reflexes.  Skin: Skin is warm and dry.     Psychiatric: He has a normal mood and affect. Judgment normal.  Nursing note and vitals reviewed.   ED Course  Procedures (including critical care time)  DIAGNOSTIC STUDIES: Oxygen Saturation is 98% on RA, Normal by my interpretation.    COORDINATION OF CARE: 3:15 AM- Will order imaging and blood work. Will give Boostrix. Discussed treatment plan with pt at bedside and pt agreed to plan.     Labs Review Labs Reviewed - No data to display  Imaging Review Dg Femur Min 2 Views Left  01/07/2016  CLINICAL DATA:  [place] with lacerations the left inner thigh area. EXAM: LEFT FEMUR 2 VIEWS COMPARISON:  None. FINDINGS: There is no evidence of fracture or other focal bone lesions. Soft tissues are unremarkable. No radiopaque soft tissue foreign bodies. Degenerative changes in the left hip and left knee. Vascular calcifications. IMPRESSION: No  acute bony abnormalities. Electronically Signed   By: Lucienne Capers M.D.   On: 01/07/2016 02:49   I have personally reviewed and evaluated these images and lab results as part of my medical decision-making.   EKG Interpretation None      MDM   Final diagnoses:  Laceration    Filed Vitals:   01/07/16 0132 01/07/16 0536  BP: 135/79 161/93  Pulse: 68 72  Temp: 98 F (36.7 C) 98 F (36.7 C)  Resp: 18 19   Results for orders placed or performed during the hospital encounter of 01/07/16  CBC with Differential/Platelet  Result Value Ref Range   WBC 12.4 (H) 4.0 - 10.5 K/uL   RBC 4.79 4.22 - 5.81 MIL/uL   Hemoglobin 15.2 13.0 - 17.0 g/dL   HCT 42.6 39.0 - 52.0 %   MCV 88.9 78.0 - 100.0 fL   MCH 31.7 26.0 - 34.0 pg   MCHC 35.7 30.0 - 36.0 g/dL   RDW 13.1 11.5 - 15.5 %   Platelets 210 150 - 400 K/uL   Neutrophils Relative % 78 %   Neutro Abs 9.8 (H) 1.7 - 7.7 K/uL   Lymphocytes Relative 12 %   Lymphs Abs 1.4 0.7 - 4.0 K/uL   Monocytes Relative 9 %   Monocytes Absolute 1.1 (H) 0.1 - 1.0 K/uL   Eosinophils Relative 1 %   Eosinophils Absolute 0.1 0.0 - 0.7 K/uL   Basophils Relative 0 %   Basophils Absolute 0.0 0.0 - 0.1 K/uL  I-stat chem 8, ed  Result Value Ref Range   Sodium 140 135 - 145 mmol/L   Potassium 3.7 3.5 - 5.1 mmol/L   Chloride 103 101 - 111 mmol/L   BUN 26 (H) 6 - 20 mg/dL   Creatinine, Ser 2.60 (H) 0.61 - 1.24 mg/dL   Glucose, Bld 196 (H) 65 - 99 mg/dL   Calcium, Ion 1.07 (L) 1.12 - 1.23 mmol/L   TCO2 23 0 - 100 mmol/L   Hemoglobin 15.6 13.0 - 17.0 g/dL   HCT 46.0 39.0 - 52.0 %   Dg Femur Min 2 Views Left  01/07/2016  CLINICAL DATA:  [place] with lacerations the left inner thigh area. EXAM: LEFT FEMUR 2 VIEWS COMPARISON:  None. FINDINGS: There is no evidence of fracture or other focal bone lesions. Soft tissues are unremarkable. No radiopaque soft tissue foreign bodies. Degenerative changes  in the left hip and left knee. Vascular calcifications.  IMPRESSION: No acute bony abnormalities. Electronically Signed   By: Lucienne Capers M.D.   On: 01/07/2016 02:49    Medications  lidocaine (PF) (XYLOCAINE) 1 % injection 5 mL (not administered)  morphine 4 MG/ML injection 4 mg (not administered)  Tdap (BOOSTRIX) injection 0.5 mL (0.5 mLs Intramuscular Given 01/07/16 0219)  fentaNYL (SUBLIMAZE) injection 50 mcg (50 mcg Intravenous Given 01/07/16 0417)  lidocaine-EPINEPHrine-tetracaine (LET) solution (3 mLs Topical Given 01/07/16 0300)    Case d/w Dr. Trula Slade of vascular get arterial doppler to exclude injury to the femoral artery.  If negative safe for discharge  Duplex negative for dissection, aneurysm or vascular injury  Pain medication at discharge and antibiotics as patient is a poorly controlled diabetic.  Stapkle I personally performed the services described in this documentation, which was scribed in my presence. The recorded information has been reviewed and is accurate.     Veatrice Kells, MD 01/07/16 0741  Alie Hardgrove, MD 01/07/16 0746  Jenni Thew, MD 01/07/16 [phone]

## 2016-01-07 NOTE — ED Notes (Signed)
Bed: WA17 Expected date:  Expected time:  Means of arrival:  Comments: EMS fall / laceration

## 2016-01-07 NOTE — ED Notes (Addendum)
Vascular at the bedside. 

## 2016-01-07 NOTE — Progress Notes (Signed)
*  PRELIMINARY RESULTS* Vascular Ultrasound Left Lower Extremity Arterial Duplex has been completed.  Left lower extremity arteries are patent with multiphasic flow throughout their length. There is no evidence of left femoral artery dissection or pseudoaneurysm.  01/07/2016 8:32 AM Maudry Mayhew, RVT, RDCS, RDMS

## 2016-01-12 ENCOUNTER — Emergency Department (HOSPITAL_COMMUNITY)
Admission: EM | Admit: 2016-01-12 | Discharge: 2016-01-12 | Disposition: A | Discharge status: Home / Self Care | Payer: Medicare HMO | Attending: Emergency Medicine | Admitting: Emergency Medicine

## 2016-01-12 ENCOUNTER — Encounter (HOSPITAL_COMMUNITY): Payer: Self-pay

## 2016-01-12 DIAGNOSIS — Z794 Long term (current) use of insulin: Secondary | ICD-10-CM | POA: Diagnosis not present

## 2016-01-12 DIAGNOSIS — Z8673 Personal history of transient ischemic attack (TIA), and cerebral infarction without residual deficits: Secondary | ICD-10-CM | POA: Insufficient documentation

## 2016-01-12 DIAGNOSIS — Z7902 Long term (current) use of antithrombotics/antiplatelets: Secondary | ICD-10-CM | POA: Diagnosis not present

## 2016-01-12 DIAGNOSIS — M1990 Unspecified osteoarthritis, unspecified site: Secondary | ICD-10-CM | POA: Insufficient documentation

## 2016-01-12 DIAGNOSIS — I10 Essential (primary) hypertension: Secondary | ICD-10-CM | POA: Diagnosis not present

## 2016-01-12 DIAGNOSIS — E785 Hyperlipidemia, unspecified: Secondary | ICD-10-CM | POA: Insufficient documentation

## 2016-01-12 DIAGNOSIS — Z8669 Personal history of other diseases of the nervous system and sense organs: Secondary | ICD-10-CM | POA: Insufficient documentation

## 2016-01-12 DIAGNOSIS — Z7984 Long term (current) use of oral hypoglycemic drugs: Secondary | ICD-10-CM | POA: Diagnosis not present

## 2016-01-12 DIAGNOSIS — E119 Type 2 diabetes mellitus without complications: Secondary | ICD-10-CM | POA: Diagnosis not present

## 2016-01-12 DIAGNOSIS — Z8719 Personal history of other diseases of the digestive system: Secondary | ICD-10-CM | POA: Insufficient documentation

## 2016-01-12 DIAGNOSIS — L7621 Postprocedural hemorrhage and hematoma of skin and subcutaneous tissue following a dermatologic procedure: Secondary | ICD-10-CM | POA: Diagnosis not present

## 2016-01-12 DIAGNOSIS — Z5189 Encounter for other specified aftercare: Secondary | ICD-10-CM

## 2016-01-12 DIAGNOSIS — F17210 Nicotine dependence, cigarettes, uncomplicated: Secondary | ICD-10-CM | POA: Insufficient documentation

## 2016-01-12 DIAGNOSIS — Z79899 Other long term (current) drug therapy: Secondary | ICD-10-CM | POA: Insufficient documentation

## 2016-01-12 DIAGNOSIS — Z4801 Encounter for change or removal of surgical wound dressing: Secondary | ICD-10-CM | POA: Diagnosis not present

## 2016-01-12 NOTE — ED Provider Notes (Signed)
CSN: [id number]     Arrival date & time 01/12/16  1947 History  By signing my name below, I, Helane Gunther, attest that this documentation has been prepared under the direction and in the presence of [employer], PA-C. Electronically Signed: Helane Gunther, ED Scribe. 01/12/2016. 8:49 PM.    Chief Complaint  Patient presents with  . Wound Check   The history is provided by the patient. No language interpreter was used.   HPI Comments: Richard [name] is a 60 y.o. male smoker at 1 ppd with a PMHx of stroke, HTN, DM, and neuropathy who presents to the Emergency Department for a wound check of a laceration to the left upper leg sustained 5 days ago. Pt states he sustained the laceration when he tripped and fell, landing on a broken glass bottle that punctured his thigh. He notes he was seen that day in the ED, where the wound was stapled. He reports associated discharge from the wound, as well as tenderness and skin discoloration around the injury. He notes his PCP is at the [place], but he has not seen them for this, as it is often difficult to get in to be seen. Pt denies fever.   Past Medical History  Diagnosis Date  . Hypertension   . Arthritis   . GERD (gastroesophageal reflux disease)   . Migraines   . Neuropathy ([place])   . Ruptured lumbar disc   . Diabetes mellitus     diet controlled  . Stroke [facility])    Past Surgical History  Procedure Laterality Date  . Knee surgery      bil   History reviewed. No pertinent family history. Social History  Substance Use Topics  . Smoking status: Current Every Day Smoker -- 1.00 packs/day    Types: Cigarettes  . Smokeless tobacco: None  . Alcohol Use: No    Review of Systems  Constitutional: Negative for fever.  Musculoskeletal: Positive for myalgias.  Skin: Positive for color change and wound.    Allergies  Review of patient's allergies indicates no known allergies.  Home Medications   Prior to Admission medications   Medication  Sig Start Date End Date Taking? Authorizing Provider  clindamycin (CLEOCIN) 300 MG capsule Take 1 capsule (300 mg total) by mouth 3 (three) times daily. X 7 days 01/07/16   April Palumbo, MD  clopidogrel (PLAVIX) 75 MG tablet Take 1 tablet (75 mg total) by mouth daily with breakfast. 09/09/12   Pattricia Boss, MD  glipiZIDE (GLUCOTROL XL) 5 MG 24 hr tablet Take 1 tablet (5 mg total) by mouth daily with breakfast. For diabetes Patient taking differently: Take 5 mg by mouth 2 (two) times daily. For diabetes 09/09/12   Pattricia Boss, MD  hydrALAZINE (APRESOLINE) 10 MG tablet Take 1 tablet (10 mg total) by mouth 3 (three) times daily. 09/09/12   Pattricia Boss, MD  HYDROcodone-acetaminophen (NORCO) 5-325 MG tablet Take 1 tablet by mouth every 6 (six) hours as needed. 01/07/16   April Palumbo, MD  insulin glargine (LANTUS) 100 unit/mL SOPN Inject 12 Units into the skin daily.    Historical Provider, MD  labetalol (NORMODYNE) 200 MG tablet Take 1 tablet (200 mg total) by mouth 3 (three) times daily. For blood pressure. Note change in dose. 09/09/12   Pattricia Boss, MD  pravastatin (PRAVACHOL) 80 MG tablet Take 80 mg by mouth at bedtime.    Historical Provider, MD  sertraline (ZOLOFT) 100 MG tablet Take 100 mg by mouth daily.  Historical Provider, MD  sildenafil (VIAGRA) 100 MG tablet Take 100 mg by mouth as needed for erectile dysfunction. Take 1 hour prior to sexual activity-- do not exceed 1 dose per 24 hour period.    Historical Provider, MD   BP 164/89 mmHg  Pulse 78  Temp(Src) 98.2 F (36.8 C) (Oral)  Resp 16  SpO2 99% Physical Exam  Constitutional: He is oriented to person, place, and time. He appears well-developed and well-nourished.  HENT:  Head: Normocephalic and atraumatic.  Eyes: Conjunctivae and EOM are normal. Right eye exhibits no discharge. Left eye exhibits no discharge.  Neck: Normal range of motion.  Pulmonary/Chest: Effort normal. No respiratory distress.  Abdominal: He exhibits no  distension.  Musculoskeletal: Normal range of motion.  Neurological: He is alert and oriented to person, place, and time. Coordination normal.  Skin: Skin is warm and dry. No rash noted. He is not diaphoretic. No erythema.  Wound at the left inner thigh with intact staples and associated hematoma. No redness, drainage, or wound dehiscence   Psychiatric: He has a normal mood and affect.  Nursing note and vitals reviewed.   ED Course  Procedures  DIAGNOSTIC STUDIES: Oxygen Saturation is 99% on RA, normal by my interpretation.    COORDINATION OF CARE: 8:46 PM - Discussed plans to apply a clean bandage to the wound. Advised to apply warm compresses. Pt advised of plan for treatment and pt agrees.  Labs Review Labs Reviewed - No data to display  Imaging Review No results found. I have personally reviewed and evaluated these images and lab results as part of my medical decision-making.   EKG Interpretation None      MDM   Final diagnoses:  Visit for wound check    Wound appears to be healing properly without evidence of infection.   I personally performed the services described in this documentation, which was scribed in my presence. The recorded information has been reviewed and is accurate.     Charlann Lange, PA-C 01/17/16 0423  Varney Biles, MD 01/17/16 [phone]

## 2016-01-12 NOTE — Discharge Instructions (Signed)
Heat Therapy Heat therapy can help ease sore, stiff, injured, and tight muscles and joints. Heat relaxes your muscles, which may help ease your pain.  RISKS AND COMPLICATIONS If you have any of the following conditions, do not use heat therapy unless your health care provider has approved:  Poor circulation.  Healing wounds or scarred skin in the area being treated.  Diabetes, heart disease, or high blood pressure.  Not being able to feel (numbness) the area being treated.  Unusual swelling of the area being treated.  Active infections.  Blood clots.  Cancer.  Inability to communicate pain. This may include young children and people who have problems with their brain function (dementia).  Pregnancy. Heat therapy should only be used on old, pre-existing, or long-lasting (chronic) injuries. Do not use heat therapy on new injuries unless directed by your health care provider. HOW TO USE HEAT THERAPY There are several different kinds of heat therapy, including:  Moist heat pack.  Warm water bath.  Hot water bottle.  Electric heating pad.  Heated gel pack.  Heated wrap.  Electric heating pad. Use the heat therapy method suggested by your health care provider. Follow your health care provider's instructions on when and how to use heat therapy. GENERAL HEAT THERAPY RECOMMENDATIONS  Do not sleep while using heat therapy. Only use heat therapy while you are awake.  Your skin may turn pink while using heat therapy. Do not use heat therapy if your skin turns red.  Do not use heat therapy if you have new pain.  High heat or long exposure to heat can cause burns. Be careful when using heat therapy to avoid burning your skin.  Do not use heat therapy on areas of your skin that are already irritated, such as with a rash or sunburn. SEEK MEDICAL CARE IF:  You have blisters, redness, swelling, or numbness.  You have new pain.  Your pain is worse. MAKE SURE  YOU:  Understand these instructions.  Will watch your condition.  Will get help right away if you are not doing well or get worse.   This information is not intended to replace advice given to you by your health care provider. Make sure you discuss any questions you have with your health care provider.   Document Released: 12/01/2011 Document Revised: 09/29/2014 Document Reviewed: 11/01/2013 Elsevier Interactive Patient Education 2016 Elsevier Inc[employer] Hematoma A hematoma is a collection of blood under the skin, in an organ, in a body space, in a joint space, or in other tissue. The blood can clot to form a lump that you can see and feel. The lump is often firm and may sometimes become sore and tender. Most hematomas get better in a few days to weeks. However, some hematomas may be serious and require medical care. Hematomas can range in size from very small to very large. CAUSES  A hematoma can be caused by a blunt or penetrating injury. It can also be caused by spontaneous leakage from a blood vessel under the skin. Spontaneous leakage from a blood vessel is more likely to occur in older people, especially those taking blood thinners. Sometimes, a hematoma can develop after certain medical procedures. SIGNS AND SYMPTOMS   A firm lump on the body.  Possible pain and tenderness in the area.  Bruising.Blue, dark blue, purple-red, or yellowish skin may appear at the site of the hematoma if the hematoma is close to the surface of the skin. For hematomas in deeper tissues or body  spaces, the signs and symptoms may be subtle. For example, an intra-abdominal hematoma may cause abdominal pain, weakness, fainting, and shortness of breath. An intracranial hematoma may cause a headache or symptoms such as weakness, trouble speaking, or a change in consciousness. DIAGNOSIS  A hematoma can usually be diagnosed based on your medical history and a physical exam. Imaging tests may be needed if your health  care provider suspects a hematoma in deeper tissues or body spaces, such as the abdomen, head, or chest. These tests may include ultrasonography or a CT scan.  TREATMENT  Hematomas usually go away on their own over time. Rarely does the blood need to be drained out of the body. Large hematomas or those that may affect vital organs will sometimes need surgical drainage or monitoring. HOME CARE INSTRUCTIONS   Apply ice to the injured area:   Put ice in a plastic bag.   Place a towel between your skin and the bag.   Leave the ice on for 20 minutes, 2-3 times a day for the first 1 to 2 days.   After the first 2 days, switch to using warm compresses on the hematoma.   Elevate the injured area to help decrease pain and swelling. Wrapping the area with an elastic bandage may also be helpful. Compression helps to reduce swelling and promotes shrinking of the hematoma. Make sure the bandage is not wrapped too tight.   If your hematoma is on a lower extremity and is painful, crutches may be helpful for a couple days.   Only take over-the-counter or prescription medicines as directed by your health care provider. SEEK IMMEDIATE MEDICAL CARE IF:   You have increasing pain, or your pain is not controlled with medicine.   You have a fever.   You have worsening swelling or discoloration.   Your skin over the hematoma breaks or starts bleeding.   Your hematoma is in your chest or abdomen and you have weakness, shortness of breath, or a change in consciousness.  Your hematoma is on your scalp (caused by a fall or injury) and you have a worsening headache or a change in alertness or consciousness. MAKE SURE YOU:   Understand these instructions.  Will watch your condition.  Will get help right away if you are not doing well or get worse.   This information is not intended to replace advice given to you by your health care provider. Make sure you discuss any questions you have with  your health care provider.   Document Released: 04/22/2004 Document Revised: 05/11/2013 Document Reviewed: 02/16/2013 Elsevier Interactive Patient Education [employer].

## 2016-01-12 NOTE — ED Notes (Signed)
Pt arrives to have left thigh wound rechecked. Pt was seen her initially 3-4 days ago here and had staples placed. Pt A+OX4, denies fever at home, staples remain in place, dried bloody drainage on 4x4, incision not actively draining, no pus observed from wound.

## 2016-01-23 ENCOUNTER — Emergency Department (HOSPITAL_COMMUNITY)
Admission: EM | Admit: 2016-01-23 | Discharge: 2016-01-23 | Disposition: A | Discharge status: Home / Self Care | Payer: Medicare HMO | Attending: Emergency Medicine | Admitting: Emergency Medicine

## 2016-01-23 ENCOUNTER — Encounter (HOSPITAL_COMMUNITY): Payer: Self-pay | Admitting: Emergency Medicine

## 2016-01-23 DIAGNOSIS — Z7984 Long term (current) use of oral hypoglycemic drugs: Secondary | ICD-10-CM | POA: Insufficient documentation

## 2016-01-23 DIAGNOSIS — Z7902 Long term (current) use of antithrombotics/antiplatelets: Secondary | ICD-10-CM | POA: Insufficient documentation

## 2016-01-23 DIAGNOSIS — Z79891 Long term (current) use of opiate analgesic: Secondary | ICD-10-CM | POA: Diagnosis not present

## 2016-01-23 DIAGNOSIS — I10 Essential (primary) hypertension: Secondary | ICD-10-CM | POA: Diagnosis not present

## 2016-01-23 DIAGNOSIS — F17210 Nicotine dependence, cigarettes, uncomplicated: Secondary | ICD-10-CM | POA: Diagnosis not present

## 2016-01-23 DIAGNOSIS — Z794 Long term (current) use of insulin: Secondary | ICD-10-CM | POA: Diagnosis not present

## 2016-01-23 DIAGNOSIS — M1990 Unspecified osteoarthritis, unspecified site: Secondary | ICD-10-CM | POA: Insufficient documentation

## 2016-01-23 DIAGNOSIS — E1140 Type 2 diabetes mellitus with diabetic neuropathy, unspecified: Secondary | ICD-10-CM | POA: Insufficient documentation

## 2016-01-23 DIAGNOSIS — Z4802 Encounter for removal of sutures: Secondary | ICD-10-CM

## 2016-01-23 DIAGNOSIS — Z792 Long term (current) use of antibiotics: Secondary | ICD-10-CM | POA: Diagnosis not present

## 2016-01-23 DIAGNOSIS — K219 Gastro-esophageal reflux disease without esophagitis: Secondary | ICD-10-CM | POA: Diagnosis not present

## 2016-01-23 DIAGNOSIS — Z8673 Personal history of transient ischemic attack (TIA), and cerebral infarction without residual deficits: Secondary | ICD-10-CM | POA: Insufficient documentation

## 2016-01-23 DIAGNOSIS — Z79899 Other long term (current) drug therapy: Secondary | ICD-10-CM | POA: Diagnosis not present

## 2016-01-23 MED ORDER — BACITRACIN ZINC 500 UNIT/GM EX OINT
TOPICAL_OINTMENT | Freq: Once | CUTANEOUS | Status: AC
  Administered 2016-01-23: 1 via TOPICAL
  Filled 2016-01-23: qty 0.9

## 2016-01-23 MED ORDER — BACITRACIN ZINC 500 UNIT/GM EX OINT
TOPICAL_OINTMENT | CUTANEOUS | Status: AC
  Filled 2016-01-23: qty 0.9

## 2016-01-23 NOTE — ED Provider Notes (Signed)
CSN: [id number]     Arrival date & time 01/23/16  0435 History   First MD Initiated Contact with Patient 01/23/16 [phone]     Chief Complaint  Patient presents with  . Suture / Staple Removal     (Consider location/radiation/quality/duration/timing/severity/associated sxs/prior Treatment) HPI Comments: Patient is a 60 year old male who presents for staple removal. He fell and lacerated his leg approximately 2 weeks ago and received 3 staples to the thigh. He is here to have these removed. He has no problems or complaints.  Patient is a 60 y.o. male presenting with suture removal. The history is provided by the patient.  Suture / Staple Removal This is a new problem. Episode onset: 2 weeks ago. The problem occurs constantly. The problem has not changed since onset.Nothing aggravates the symptoms. Nothing relieves the symptoms.    Past Medical History  Diagnosis Date  . Hypertension   . Arthritis   . GERD (gastroesophageal reflux disease)   . Migraines   . Neuropathy ([place])   . Ruptured lumbar disc   . Diabetes mellitus     diet controlled  . Stroke [facility])    Past Surgical History  Procedure Laterality Date  . Knee surgery      bil   History reviewed. No pertinent family history. Social History  Substance Use Topics  . Smoking status: Current Every Day Smoker -- 1.00 packs/day    Types: Cigarettes  . Smokeless tobacco: None  . Alcohol Use: No    Review of Systems  All other systems reviewed and are negative.     Allergies  Review of patient's allergies indicates no known allergies.  Home Medications   Prior to Admission medications   Medication Sig Start Date End Date Taking? Authorizing Provider  clindamycin (CLEOCIN) 300 MG capsule Take 1 capsule (300 mg total) by mouth 3 (three) times daily. X 7 days 01/07/16   April Palumbo, MD  clopidogrel (PLAVIX) 75 MG tablet Take 1 tablet (75 mg total) by mouth daily with breakfast. 09/09/12   Pattricia Boss, MD  glipiZIDE  (GLUCOTROL XL) 5 MG 24 hr tablet Take 1 tablet (5 mg total) by mouth daily with breakfast. For diabetes Patient taking differently: Take 5 mg by mouth 2 (two) times daily. For diabetes 09/09/12   Pattricia Boss, MD  hydrALAZINE (APRESOLINE) 10 MG tablet Take 1 tablet (10 mg total) by mouth 3 (three) times daily. 09/09/12   Pattricia Boss, MD  HYDROcodone-acetaminophen (NORCO) 5-325 MG tablet Take 1 tablet by mouth every 6 (six) hours as needed. 01/07/16   April Palumbo, MD  insulin glargine (LANTUS) 100 unit/mL SOPN Inject 12 Units into the skin daily.    Historical Provider, MD  labetalol (NORMODYNE) 200 MG tablet Take 1 tablet (200 mg total) by mouth 3 (three) times daily. For blood pressure. Note change in dose. 09/09/12   Pattricia Boss, MD  pravastatin (PRAVACHOL) 80 MG tablet Take 80 mg by mouth at bedtime.    Historical Provider, MD  sertraline (ZOLOFT) 100 MG tablet Take 100 mg by mouth daily.    Historical Provider, MD  sildenafil (VIAGRA) 100 MG tablet Take 100 mg by mouth as needed for erectile dysfunction. Take 1 hour prior to sexual activity-- do not exceed 1 dose per 24 hour period.    Historical Provider, MD   BP 189/102 mmHg  Pulse 97  Temp(Src) 98.2 F (36.8 C) (Oral)  Resp 18  SpO2 97% Physical Exam  Constitutional: He is oriented to person, place, and  time. He appears well-developed and well-nourished. No distress.  HENT:  Head: Normocephalic and atraumatic.  Neck: Normal range of motion. Neck supple.  Neurological: He is alert and oriented to person, place, and time.  Skin: Skin is warm and dry. He is not diaphoretic.  There is a laceration present to the medial left thigh. There is some induration under the laceration, however no fluctuance or purulent drainage. There is no redness and no erythema.  Nursing note and vitals reviewed.   ED Course  Procedures (including critical care time) Labs Review Labs Reviewed - No data to display  Imaging Review No results found. I  have personally reviewed and evaluated these images and lab results as part of my medical decision-making.   EKG Interpretation None      MDM   Final diagnoses:  None    3 staples were removed successfully. He will be discharged with local wound care and when necessary return.    Veryl Speak, MD 01/23/16 [phone]

## 2016-01-23 NOTE — Discharge Instructions (Signed)
Local wound care with bacitracin and dressing changes twice daily.  Return to the emergency department if you develop redness, increased pain, pus straining from the wound, or other new and concerning symptoms.   Suture Removal, Care After Refer to this sheet in the next few weeks. These instructions provide you with information on caring for yourself after your procedure. Your health care provider may also give you more specific instructions. Your treatment has been planned according to current medical practices, but problems sometimes occur. Call your health care provider if you have any problems or questions after your procedure. WHAT TO EXPECT AFTER THE PROCEDURE After your stitches (sutures) are removed, it is typical to have the following:  Some discomfort and swelling in the wound area.  Slight redness in the area. HOME CARE INSTRUCTIONS   If you have skin adhesive strips over the wound area, do not take the strips off. They will fall off on their own in a few days. If the strips remain in place after 14 days, you may remove them.  Change any bandages (dressings) at least once a day or as directed by your health care provider. If the bandage sticks, soak it off with warm, soapy water.  Apply cream or ointment only as directed by your health care provider. If using cream or ointment, wash the area with soap and water 2 times a day to remove all the cream or ointment. Rinse off the soap and pat the area dry with a clean towel.  Keep the wound area dry and clean. If the bandage becomes wet or dirty, or if it develops a bad smell, change it as soon as possible.  Continue to protect the wound from injury.  Use sunscreen when out in the sun. New scars become sunburned easily. SEEK MEDICAL CARE IF:  You have increasing redness, swelling, or pain in the wound.  You see pus coming from the wound.  You have a fever.  You notice a bad smell coming from the wound or dressing.  Your  wound breaks open (edges not staying together).   This information is not intended to replace advice given to you by your health care provider. Make sure you discuss any questions you have with your health care provider.   Document Released: 06/03/2001 Document Revised: 06/29/2013 Document Reviewed: 04/20/2013 Elsevier Interactive Patient Education [employer].

## 2016-01-23 NOTE — ED Notes (Signed)
Pt states he is here to get staples removed from his left thigh  Pt states they were placed about 2 weeks ago

## 2016-06-26 ENCOUNTER — Ambulatory Visit: Payer: Medicare HMO | Attending: Specialist | Admitting: Rehabilitation

## 2016-07-01 ENCOUNTER — Ambulatory Visit: Payer: Medicare HMO | Admitting: Occupational Therapy

## 2016-08-10 ENCOUNTER — Emergency Department (HOSPITAL_COMMUNITY)
Admission: EM | Admit: 2016-08-10 | Discharge: 2016-08-10 | Disposition: A | Discharge status: Home / Self Care | Payer: Medicare HMO | Attending: Emergency Medicine | Admitting: Emergency Medicine

## 2016-08-10 ENCOUNTER — Encounter (HOSPITAL_COMMUNITY): Payer: Self-pay

## 2016-08-10 ENCOUNTER — Emergency Department (HOSPITAL_COMMUNITY): Payer: Medicare HMO

## 2016-08-10 DIAGNOSIS — E1140 Type 2 diabetes mellitus with diabetic neuropathy, unspecified: Secondary | ICD-10-CM | POA: Insufficient documentation

## 2016-08-10 DIAGNOSIS — F17210 Nicotine dependence, cigarettes, uncomplicated: Secondary | ICD-10-CM | POA: Insufficient documentation

## 2016-08-10 DIAGNOSIS — Y92000 Kitchen of unspecified non-institutional (private) residence as  the place of occurrence of the external cause: Secondary | ICD-10-CM | POA: Diagnosis not present

## 2016-08-10 DIAGNOSIS — Y999 Unspecified external cause status: Secondary | ICD-10-CM | POA: Diagnosis not present

## 2016-08-10 DIAGNOSIS — W01190A Fall on same level from slipping, tripping and stumbling with subsequent striking against furniture, initial encounter: Secondary | ICD-10-CM | POA: Diagnosis not present

## 2016-08-10 DIAGNOSIS — S20212A Contusion of left front wall of thorax, initial encounter: Secondary | ICD-10-CM

## 2016-08-10 DIAGNOSIS — Z794 Long term (current) use of insulin: Secondary | ICD-10-CM | POA: Diagnosis not present

## 2016-08-10 DIAGNOSIS — Z79899 Other long term (current) drug therapy: Secondary | ICD-10-CM | POA: Diagnosis not present

## 2016-08-10 DIAGNOSIS — Z8673 Personal history of transient ischemic attack (TIA), and cerebral infarction without residual deficits: Secondary | ICD-10-CM | POA: Diagnosis not present

## 2016-08-10 DIAGNOSIS — S299XXA Unspecified injury of thorax, initial encounter: Secondary | ICD-10-CM | POA: Diagnosis present

## 2016-08-10 DIAGNOSIS — I10 Essential (primary) hypertension: Secondary | ICD-10-CM | POA: Insufficient documentation

## 2016-08-10 DIAGNOSIS — Y939 Activity, unspecified: Secondary | ICD-10-CM | POA: Insufficient documentation

## 2016-08-10 MED ORDER — LIDOCAINE 4 % EX CREA: 1.0000 "application " | TOPICAL_CREAM | Freq: Three times a day (TID) | CUTANEOUS | 0 refills | Status: DC | PRN

## 2016-08-10 NOTE — Discharge Instructions (Signed)
It is very important that you take deep breaths to prevent lung collapse and infection.  Either use your incentive spirometer or take 10 deep breaths every hour to prevent lung collapse.  If you develop cough, fever or shortness of breath return immediately to the emergency room.

## 2016-08-10 NOTE — ED Triage Notes (Signed)
Patient states he slipped and fell against a chair hurting the left rib cage area.

## 2016-08-10 NOTE — ED Notes (Signed)
Pt reports he tripped on a rug in his kitchen 2 days ago-landing on a wood chair.  Pt reports L rib pain, worse with movement.  No bruising noted.

## 2016-08-10 NOTE — ED Notes (Signed)
PT DISCHARGED. INSTRUCTIONS AND PRESCRIPTION GIVEN. AAOX4. PT IN NO APPARENT DISTRESS. THE OPPORTUNITY TO ASK QUESTIONS WAS PROVIDED. 

## 2016-08-10 NOTE — ED Provider Notes (Signed)
Richard [name] Provider Note   CSN: [id number] Arrival date & time: 08/10/16  1833  By signing my name below, I, Richard [name], attest that this documentation has been prepared under the direction and in the presence of [employer]. Electronically Signed: Gwenlyn [name], ED Scribe. 08/10/16. 8:02 PM.   History   Chief Complaint Chief Complaint  Patient presents with  . Fall   The history is provided by the patient. No language interpreter was used.   HPI Comments: Richard [name] is a 60 y.o. male with PMHx of DM, GERD, HTN, and Stroke who presents to the Emergency Department complaining of constant, moderate left rib cage pain s/p fall onset 2 days PTA. Pt slipped and struck his left rib area on the wooden kitchen table. Pt is taking 20 mg Oxycodone for pain management.  Past Medical History:  Diagnosis Date  . Arthritis   . Diabetes mellitus    diet controlled  . GERD (gastroesophageal reflux disease)   . Hypertension   . Migraines   . Neuropathy ([place])   . Ruptured lumbar disc   . Stroke [facility])     Patient Active Problem List   Diagnosis Date Noted  . Hyperglycemia 11/29/2014  . Stroke ([place]) 08/26/2012  . Hyperlipidemia 08/25/2012  . Stage 3 chronic renal impairment associated with type 2 diabetes mellitus ([place]) 08/23/2012  . Tobacco abuse 08/23/2012  . Protein-calorie malnutrition, mild ([place]) 08/23/2012  . Type 2 diabetes mellitus ([place]) 08/23/2012  . 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 Hypertensive emergency 05/21/2012    Past Surgical History:  Procedure Laterality Date  . KNEE SURGERY     bil     Home Medications    Prior to Admission medications   Medication Sig Start Date End Date Taking? Authorizing Provider  clindamycin (CLEOCIN) 300 MG capsule Take 1 capsule (300 mg total) by mouth 3 (three) times daily. X 7 days 01/07/16   April Palumbo, MD  clopidogrel (PLAVIX) 75 MG tablet Take 1 tablet (75 mg  total) by mouth daily with breakfast. 09/09/12   Pattricia Boss, MD  glipiZIDE (GLUCOTROL XL) 5 MG 24 hr tablet Take 1 tablet (5 mg total) by mouth daily with breakfast. For diabetes Patient taking differently: Take 5 mg by mouth 2 (two) times daily. For diabetes 09/09/12   Pattricia Boss, MD  hydrALAZINE (APRESOLINE) 10 MG tablet Take 1 tablet (10 mg total) by mouth 3 (three) times daily. 09/09/12   Pattricia Boss, MD  HYDROcodone-acetaminophen (NORCO) 5-325 MG tablet Take 1 tablet by mouth every 6 (six) hours as needed. 01/07/16   April Palumbo, MD  insulin glargine (LANTUS) 100 unit/mL SOPN Inject 12 Units into the skin daily.    Historical Provider, MD  labetalol (NORMODYNE) 200 MG tablet Take 1 tablet (200 mg total) by mouth 3 (three) times daily. For blood pressure. Note change in dose. 09/09/12   Pattricia Boss, MD  lidocaine (LMX) 4 % cream Apply 1 application topically 3 (three) times daily as needed. 08/10/16   Alexsys Eskin, PA-C  pravastatin (PRAVACHOL) 80 MG tablet Take 80 mg by mouth at bedtime.    Historical Provider, MD  sertraline (ZOLOFT) 100 MG tablet Take 100 mg by mouth daily.    Historical Provider, MD  sildenafil (VIAGRA) 100 MG tablet Take 100 mg by mouth as needed for erectile dysfunction. Take 1 hour prior to sexual activity-- do not exceed 1 dose per 24 hour period.    Historical Provider,  MD    Family History Family History  Problem Relation Age of Onset  . Stroke Father   . Hypertension Father     Social History Social History  Substance Use Topics  . Smoking status: Current Every Day Smoker    Packs/day: 0.50    Types: Cigarettes  . Smokeless tobacco: Never Used  . Alcohol use No     Allergies   Patient has no known allergies.   Review of Systems Review of Systems  10 Systems reviewed and are negative for acute change except as noted in the HPI.   Physical Exam Updated Vital Signs BP 152/84 (BP Location: Left Arm)   Pulse 90   Temp 98.3 F (36.8 C)  (Oral)   Resp 16   Ht 6' (1.829 m)   Wt 83.9 kg   SpO2 100%   BMI 25.09 kg/m   Physical Exam  Constitutional: He is oriented to person, place, and time. He appears well-developed and well-nourished. No distress.  HENT:  Head: Normocephalic and atraumatic.  Mouth/Throat: Oropharynx is clear and moist.  Eyes: Conjunctivae and EOM are normal. Pupils are equal, round, and reactive to light.  Neck: Normal range of motion.  Cardiovascular: Normal rate, regular rhythm and intact distal pulses.   Pulmonary/Chest: Effort normal and breath sounds normal. No respiratory distress. He has no wheezes. He has no rales. He exhibits tenderness.    Abdominal: Soft. There is no tenderness.  Musculoskeletal: Normal range of motion.  Neurological: He is alert and oriented to person, place, and time.  Skin: He is not diaphoretic.  Psychiatric: He has a normal mood and affect.  Nursing note and vitals reviewed.    ED Treatments / Results  DIAGNOSTIC STUDIES: Oxygen Saturation is 100% on RA, normal by my interpretation.    COORDINATION OF CARE: 8:01 PM Discussed treatment plan with pt at bedside which includes Lidocaine cream and pt agreed to plan.   Labs (all labs ordered are listed, but only abnormal results are displayed) Labs Reviewed - No data to display  EKG  EKG Interpretation None       Radiology Dg Ribs Unilateral W/chest Left  Result Date: 08/10/2016 CLINICAL DATA:  Pain after trauma. EXAM: LEFT RIBS AND CHEST - 3+ VIEW COMPARISON:  November 30, 2014 FINDINGS: The heart, hila, mediastinum, lungs, and pleura are normal. No pneumothorax. No rib fractures identified. IMPRESSION: Negative. Electronically Signed   By: Dorise Bullion III M.D   On: 08/10/2016 19:41    Procedures Procedures (including critical care time)  Medications Ordered in ED Medications - No data to display   Initial Impression / Assessment and Plan / ED Course  I have reviewed the triage vital signs and  the nursing notes.  Pertinent labs & imaging results that were available during my care of the patient were reviewed by me and considered in my medical decision making (see chart for details).  Clinical Course    Vitals:   08/10/16 1849 08/10/16 1851 08/10/16 2020  BP: 151/79  152/84  Pulse: 100  90  Resp: 18  16  Temp: 98.3 F (36.8 C)    TempSrc: Oral    SpO2: 100%  100%  Weight:  83.9 kg   Height:  6' (1.829 m)      Depaul Arizpe is 60 y.o. male presenting with Chest pain after slipping and falling into the edge of a wooden table several days ago, lung sounds are clear, patient saturating well on room air, x-ray  without fracture. Patient is in pain management, declines pain medication in the ED or prescription of narcotics to go home with, will write him for Lidoderm ointment  This is a shared visit with the attending physician who personally evaluated the patient and agrees with the care plan.   Evaluation does not show pathology that would require ongoing emergent intervention or inpatient treatment. Pt is hemodynamically stable and mentating appropriately. Discussed findings and plan with patient/guardian, who agrees with care plan. All questions answered. Return precautions discussed and outpatient follow up given.      Final Clinical Impressions(s) / ED Diagnoses   Final diagnoses:  Rib contusion, left, initial encounter    New Prescriptions New Prescriptions   LIDOCAINE (LMX) 4 % CREAM    Apply 1 application topically 3 (three) times daily as needed.   I personally performed the services described in this documentation, which was scribed in my presence. The recorded information has been reviewed and is accurate.    Monico Blitz, PA-C 08/10/16 2025    Duffy Bruce, MD 08/12/16 [phone]

## 2016-08-22 ENCOUNTER — Emergency Department (HOSPITAL_COMMUNITY): Payer: Medicare HMO

## 2016-08-22 ENCOUNTER — Encounter (HOSPITAL_COMMUNITY): Payer: Self-pay | Admitting: Emergency Medicine

## 2016-08-22 ENCOUNTER — Emergency Department (HOSPITAL_COMMUNITY)
Admission: EM | Admit: 2016-08-22 | Discharge: 2016-08-22 | Disposition: A | Discharge status: Home / Self Care | Payer: Medicare HMO | Attending: Emergency Medicine | Admitting: Emergency Medicine

## 2016-08-22 DIAGNOSIS — S20212D Contusion of left front wall of thorax, subsequent encounter: Secondary | ICD-10-CM | POA: Diagnosis not present

## 2016-08-22 DIAGNOSIS — Z794 Long term (current) use of insulin: Secondary | ICD-10-CM | POA: Diagnosis not present

## 2016-08-22 DIAGNOSIS — F17210 Nicotine dependence, cigarettes, uncomplicated: Secondary | ICD-10-CM | POA: Insufficient documentation

## 2016-08-22 DIAGNOSIS — I10 Essential (primary) hypertension: Secondary | ICD-10-CM | POA: Insufficient documentation

## 2016-08-22 DIAGNOSIS — E1140 Type 2 diabetes mellitus with diabetic neuropathy, unspecified: Secondary | ICD-10-CM | POA: Diagnosis not present

## 2016-08-22 DIAGNOSIS — S299XXD Unspecified injury of thorax, subsequent encounter: Secondary | ICD-10-CM | POA: Diagnosis present

## 2016-08-22 DIAGNOSIS — J069 Acute upper respiratory infection, unspecified: Secondary | ICD-10-CM | POA: Insufficient documentation

## 2016-08-22 DIAGNOSIS — W1839XD Other fall on same level, subsequent encounter: Secondary | ICD-10-CM | POA: Insufficient documentation

## 2016-08-22 DIAGNOSIS — S20212A Contusion of left front wall of thorax, initial encounter: Secondary | ICD-10-CM

## 2016-08-22 DIAGNOSIS — Z8673 Personal history of transient ischemic attack (TIA), and cerebral infarction without residual deficits: Secondary | ICD-10-CM | POA: Insufficient documentation

## 2016-08-22 NOTE — Discharge Instructions (Signed)
Please read and follow all provided instructions.  Your diagnoses today include:  1. Viral upper respiratory tract infection   2. Contusion of rib on left side, initial encounter     You appear to have an upper respiratory infection (URI). An upper respiratory tract infection, or cold, is a viral infection of the air passages leading to the lungs. It should improve gradually after 5-7 days. You may have a lingering cough that lasts for 2- 4 weeks after the infection.  Tests performed today include:  Vital signs. See below for your results today.   Chest x-ray - no pneumonia or broken ribs  Medications prescribed:   None  Take any prescribed medications only as directed. Treatment for your infection is aimed at treating the symptoms. There are no medications, such as antibiotics, that will cure your infection.   Home care instructions:  Follow any educational materials contained in this packet.   Your illness is contagious and can be spread to others, especially during the first 3 or 4 days. It cannot be cured by antibiotics or other medicines. Take basic precautions such as washing your hands often, covering your mouth when you cough or sneeze, and avoiding public places where you could spread your illness to others.   Please continue drinking plenty of fluids. Use Tylenol for pain or fever, but do not take multiple medicines containing Tylenol or acetaminophen to avoid taking too much of this medication.  Follow-up instructions: Please follow-up with your primary care provider in the next 3 days for further evaluation of your symptoms if you are not feeling better.   Return instructions:   Please return to the Emergency Department if you experience worsening symptoms.   RETURN IMMEDIATELY IF you develop shortness of breath, confusion or altered mental status, a new rash, become dizzy, faint, or poorly responsive, or are unable to be cared for at home.  Please return if you have  persistent vomiting and cannot keep down fluids or develop a fever that is not controlled by tylenol or motrin.    Please return if you have any other emergent concerns.  Additional Information:  Your vital signs today were: BP 164/87 (BP Location: Left Arm)    Pulse 94    Temp 98.7 F (37.1 C) (Oral)    Resp 16    SpO2 100%  If your blood pressure (BP) was elevated above 135/85 this visit, please have this repeated by your doctor within one month. --------------

## 2016-08-22 NOTE — ED Triage Notes (Addendum)
Pt from home with complaints of nasal congestion and sneezing x 8 days. Pt states he is worried that he has pneumonia because he fell on 11/19 and hit his left rib. Pt denies SOB and CP. Pt denies cough. Pt is not febrile and is not tachycardic. Pt has clear lung sounds

## 2016-08-22 NOTE — ED Provider Notes (Signed)
[facility] DEPT Provider Note   CSN: [id number] Arrival date & time: 08/22/16  1448   By signing my name below, I, Neta Mends, attest that this documentation has been prepared under the direction and in the presence of Carlisle Cater, [place]. Electronically Signed: Neta Mends, ED Scribe. 08/22/2016. 3:28 PM.   History   Chief Complaint Chief Complaint  Patient presents with  . Nasal Congestion    The history is provided by the patient. No language interpreter was used.   HPI Comments:  Richard [name] is a 60 y.o. male who presents to the Emergency Department complaining of constant nasal congestion x 8 weeks. Pt was seen here 2 weeks ago for a bruised rib after a fall and was instructed to come to the ED if he had any additional problems. Pt rates his current rib pain at 6 or 7/10. Pt complains of associated cough, diaphoresis. Pt states that he had a sore throat that has resolved. No alleviating factors noted. Pt denies SOB, fever, ear pain. States that he is concerned that he is developing pneumonia and would like a chest x-ray.   Past Medical History:  Diagnosis Date  . Arthritis   . Diabetes mellitus    diet controlled  . GERD (gastroesophageal reflux disease)   . Hypertension   . Migraines   . Neuropathy ([place])   . Ruptured lumbar disc   . Stroke [facility])     Patient Active Problem List   Diagnosis Date Noted  . Hyperglycemia 11/29/2014  . Stroke ([place]) 08/26/2012  . Hyperlipidemia 08/25/2012  . Stage 3 chronic renal impairment associated with type 2 diabetes mellitus ([place]) 08/23/2012  . Tobacco abuse 08/23/2012  . Protein-calorie malnutrition, mild ([place]) 08/23/2012  . Type 2 diabetes mellitus ([place]) 08/23/2012  . 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 Hypertensive emergency 05/21/2012    Past Surgical History:  Procedure Laterality Date  . KNEE SURGERY     bil       Home Medications    Prior to Admission  medications   Medication Sig Start Date End Date Taking? Authorizing Provider  clindamycin (CLEOCIN) 300 MG capsule Take 1 capsule (300 mg total) by mouth 3 (three) times daily. X 7 days 01/07/16   April Palumbo, MD  clopidogrel (PLAVIX) 75 MG tablet Take 1 tablet (75 mg total) by mouth daily with breakfast. 09/09/12   Pattricia Boss, MD  glipiZIDE (GLUCOTROL XL) 5 MG 24 hr tablet Take 1 tablet (5 mg total) by mouth daily with breakfast. For diabetes Patient taking differently: Take 5 mg by mouth 2 (two) times daily. For diabetes 09/09/12   Pattricia Boss, MD  hydrALAZINE (APRESOLINE) 10 MG tablet Take 1 tablet (10 mg total) by mouth 3 (three) times daily. 09/09/12   Pattricia Boss, MD  HYDROcodone-acetaminophen (NORCO) 5-325 MG tablet Take 1 tablet by mouth every 6 (six) hours as needed. 01/07/16   April Palumbo, MD  insulin glargine (LANTUS) 100 unit/mL SOPN Inject 12 Units into the skin daily.    Historical Provider, MD  labetalol (NORMODYNE) 200 MG tablet Take 1 tablet (200 mg total) by mouth 3 (three) times daily. For blood pressure. Note change in dose. 09/09/12   Pattricia Boss, MD  lidocaine (LMX) 4 % cream Apply 1 application topically 3 (three) times daily as needed. 08/10/16   Nicole Pisciotta, PA-C  pravastatin (PRAVACHOL) 80 MG tablet Take 80 mg by mouth at bedtime.    Historical Provider, MD  sertraline (ZOLOFT) 100 MG tablet Take 100 mg by mouth daily.    Historical Provider, MD  sildenafil (VIAGRA) 100 MG tablet Take 100 mg by mouth as needed for erectile dysfunction. Take 1 hour prior to sexual activity-- do not exceed 1 dose per 24 hour period.    Historical Provider, MD    Family History Family History  Problem Relation Age of Onset  . Stroke Father   . Hypertension Father     Social History Social History  Substance Use Topics  . Smoking status: Current Every Day Smoker    Packs/day: 0.50    Types: Cigarettes  . Smokeless tobacco: Never Used  . Alcohol use No      Allergies   Patient has no known allergies.   Review of Systems Review of Systems  Constitutional: Negative for fever.  HENT: Positive for congestion. Negative for ear pain.   Respiratory: Positive for cough. Negative for shortness of breath.   Cardiovascular: Positive for chest pain (L rib pain).  Musculoskeletal: Positive for myalgias.     Physical Exam Updated Vital Signs BP 164/87 (BP Location: Left Arm)   Pulse 94   Temp 98.7 F (37.1 C) (Oral)   Resp 16   SpO2 100%   Physical Exam  Constitutional: He appears well-developed and well-nourished. No distress.  HENT:  Head: Normocephalic and atraumatic.  Right Ear: Tympanic membrane, external ear and ear canal normal.  Left Ear: Tympanic membrane, external ear and ear canal normal.  Nose: Mucosal edema present. No rhinorrhea.  Mouth/Throat: Uvula is midline, oropharynx is clear and moist and mucous membranes are normal. Mucous membranes are not dry. No trismus in the jaw. No uvula swelling. No oropharyngeal exudate, posterior oropharyngeal edema, posterior oropharyngeal erythema or tonsillar abscesses.  Eyes: Conjunctivae are normal. Right eye exhibits no discharge. Left eye exhibits no discharge.  Neck: Normal range of motion. Neck supple.  Cardiovascular: Normal rate, regular rhythm and normal heart sounds.   Pulmonary/Chest: Effort normal and breath sounds normal. No respiratory distress. He has no wheezes. He has no rales. He exhibits tenderness (L anterior ribs).  Abdominal: Soft. He exhibits no distension. There is no tenderness.  Neurological: He is alert.  Skin: Skin is warm and dry.  Psychiatric: He has a normal mood and affect.  Nursing note and vitals reviewed.    ED Treatments / Results  DIAGNOSTIC STUDIES:  Oxygen Saturation is 100% on RA, normal by my interpretation.    COORDINATION OF CARE:  3:28 PM Will order CXR. Discussed treatment plan with pt at bedside and pt agreed to plan.    Radiology Dg Chest 2 View  Result Date: 08/22/2016 CLINICAL DATA:  Right rib pain and cough.  URI symptoms. EXAM: CHEST  2 VIEW COMPARISON:  08/10/2016 FINDINGS: Normal heart size and mediastinal contours. No acute infiltrate or edema. No effusion or pneumothorax. No acute osseous findings. IMPRESSION: Negative chest. Electronically Signed   By: Monte Fantasia M.D.   On: 08/22/2016 16:41    Procedures Procedures (including critical care time)   Initial Impression / Assessment and Plan / ED Course  I have reviewed the triage vital signs and the nursing notes.  Pertinent labs & imaging results that were available during my care of the patient were reviewed by me and considered in my medical decision making (see chart for details).  Clinical Course    Patient informed of x-ray results. Counseled on Tylenol for pain. He may also use warm compresses. Return to the  emergency department with fever, difficulty breathing, increased cough.  Vital signs reviewed and are as follows: BP 144/82 (BP Location: Left Arm)   Pulse 92   Temp 98.7 F (37.1 C) (Oral)   Resp 17   SpO2 99%     Final Clinical Impressions(s) / ED Diagnoses   Final diagnoses:  Viral upper respiratory tract infection  Contusion of rib on left side, initial encounter   URI: Patient with symptoms consistent with a viral syndrome. Vitals are stable, no fever. No signs of dehydration. Lung exam normal, no signs of pneumonia. Supportive therapy indicated with return if symptoms worsen.    Rib contusion: Slowly improving, CXR neg.   New Prescriptions Discharge Medication List as of 08/22/2016  5:16 PM    I personally performed the services described in this documentation, which was scribed in my presence. The recorded information has been reviewed and is accurate.     Carlisle Cater, PA-C 08/22/16 2124    Fatima Blank, MD 08/24/16 [phone]

## 2016-09-25 ENCOUNTER — Emergency Department (HOSPITAL_COMMUNITY)
Admission: EM | Admit: 2016-09-25 | Discharge: 2016-09-25 | Disposition: A | Discharge status: Home / Self Care | Payer: Commercial Managed Care - HMO | Attending: Emergency Medicine | Admitting: Emergency Medicine

## 2016-09-25 ENCOUNTER — Encounter (HOSPITAL_COMMUNITY): Payer: Self-pay | Admitting: *Deleted

## 2016-09-25 DIAGNOSIS — E1165 Type 2 diabetes mellitus with hyperglycemia: Secondary | ICD-10-CM | POA: Insufficient documentation

## 2016-09-25 DIAGNOSIS — Z794 Long term (current) use of insulin: Secondary | ICD-10-CM | POA: Diagnosis not present

## 2016-09-25 DIAGNOSIS — E1122 Type 2 diabetes mellitus with diabetic chronic kidney disease: Secondary | ICD-10-CM | POA: Insufficient documentation

## 2016-09-25 DIAGNOSIS — Z79899 Other long term (current) drug therapy: Secondary | ICD-10-CM | POA: Insufficient documentation

## 2016-09-25 DIAGNOSIS — N183 Chronic kidney disease, stage 3 (moderate): Secondary | ICD-10-CM | POA: Insufficient documentation

## 2016-09-25 DIAGNOSIS — R739 Hyperglycemia, unspecified: Secondary | ICD-10-CM

## 2016-09-25 DIAGNOSIS — Z8673 Personal history of transient ischemic attack (TIA), and cerebral infarction without residual deficits: Secondary | ICD-10-CM | POA: Insufficient documentation

## 2016-09-25 DIAGNOSIS — I129 Hypertensive chronic kidney disease with stage 1 through stage 4 chronic kidney disease, or unspecified chronic kidney disease: Secondary | ICD-10-CM | POA: Diagnosis not present

## 2016-09-25 DIAGNOSIS — E1140 Type 2 diabetes mellitus with diabetic neuropathy, unspecified: Secondary | ICD-10-CM | POA: Insufficient documentation

## 2016-09-25 DIAGNOSIS — F17210 Nicotine dependence, cigarettes, uncomplicated: Secondary | ICD-10-CM | POA: Diagnosis not present

## 2016-09-25 LAB — I-STAT CHEM 8, ED
BUN: 32 mg/dL — ABNORMAL HIGH (ref 6–20)
CREATININE: 2.9 mg/dL — AB (ref 0.61–1.24)
Calcium, Ion: 1.13 mmol/L — ABNORMAL LOW (ref 1.15–1.40)
Chloride: 92 mmol/L — ABNORMAL LOW (ref 101–111)
GLUCOSE: 599 mg/dL — AB (ref 65–99)
HCT: 43 % (ref 39.0–52.0)
HEMOGLOBIN: 14.6 g/dL (ref 13.0–17.0)
POTASSIUM: 3.9 mmol/L (ref 3.5–5.1)
Sodium: 129 mmol/L — ABNORMAL LOW (ref 135–145)
TCO2: 26 mmol/L (ref 0–100)

## 2016-09-25 LAB — URINALYSIS, ROUTINE W REFLEX MICROSCOPIC
BILIRUBIN URINE: NEGATIVE
Bacteria, UA: NONE SEEN
Glucose, UA: >=500 mg/dL — AB
HGB URINE DIPSTICK: NEGATIVE
KETONES UR: NEGATIVE mg/dL
Leukocytes, UA: NEGATIVE
NITRITE: NEGATIVE
Protein, ur: 100 mg/dL — AB
Specific Gravity, Urine: 1.02 (ref 1.005–1.030)
pH: 5 (ref 5.0–8.0)

## 2016-09-25 LAB — CBG MONITORING, ED
Glucose-Capillary: >600 mg/dL (ref 65–99)
Glucose-Capillary: 539 mg/dL (ref 65–99)
Glucose-Capillary: 484 mg/dL — ABNORMAL HIGH (ref 65–99)

## 2016-09-25 LAB — BASIC METABOLIC PANEL
ANION GAP: 12 (ref 5–15)
BUN: 30 mg/dL — ABNORMAL HIGH (ref 6–20)
CALCIUM: 9.1 mg/dL (ref 8.9–10.3)
CO2: 24 mmol/L (ref 22–32)
Chloride: 89 mmol/L — ABNORMAL LOW (ref 101–111)
Creatinine, Ser: 3.29 mg/dL — ABNORMAL HIGH (ref 0.61–1.24)
GFR, EST AFRICAN AMERICAN: 22 mL/min — AB (ref >60)
GFR, EST NON AFRICAN AMERICAN: 19 mL/min — AB (ref >60)
Glucose, Bld: 714 mg/dL (ref 65–99)
POTASSIUM: 4 mmol/L (ref 3.5–5.1)
Sodium: 125 mmol/L — ABNORMAL LOW (ref 135–145)

## 2016-09-25 LAB — CBC
HCT: 40.4 % (ref 39.0–52.0)
HEMOGLOBIN: 14.5 g/dL (ref 13.0–17.0)
MCH: 30.3 pg (ref 26.0–34.0)
MCHC: 35.9 g/dL (ref 30.0–36.0)
MCV: 84.5 fL (ref 78.0–100.0)
Platelets: 204 10*3/uL (ref 150–400)
RBC: 4.78 MIL/uL (ref 4.22–5.81)
RDW: 12.4 % (ref 11.5–15.5)
WBC: 7.4 10*3/uL (ref 4.0–10.5)

## 2016-09-25 MED ORDER — INSULIN GLARGINE 100 UNIT/ML ~~LOC~~ SOLN
12.0000 [IU] | Freq: Once | SUBCUTANEOUS | Status: AC
  Administered 2016-09-25: 12 [IU] via SUBCUTANEOUS
  Filled 2016-09-25: qty 0.12

## 2016-09-25 MED ORDER — SODIUM CHLORIDE 0.9 % IV BOLUS (SEPSIS)
1000.0000 mL | Freq: Once | INTRAVENOUS | Status: AC
  Administered 2016-09-25: 1000 mL via INTRAVENOUS

## 2016-09-25 NOTE — ED Notes (Signed)
Notified Dr. Leonette Monarch of glucose 714 reported by lab and finding patient a bed.

## 2016-09-25 NOTE — Discharge Instructions (Signed)
It was my pleasure taking care of you today! Please continue to take for blood sugar medications as directed. It is very important that you keep your scheduled appointment with your primary care provider. Please monitor your blood sugars closely and adhere to a low carb, low sugar diet. Stay hydrated. Return to the ER for new or worsening symptoms, any additional concerns.

## 2016-09-25 NOTE — ED Provider Notes (Signed)
[facility] DE[facility] Provider Note   CSN: [id number] Arrival date & time: 09/25/16  1240     History   Chief Complaint Chief Complaint  Patient presents with  . Urinary Frequency  . Hyperglycemia    HPI Richard [name] is a 61 y.o. male.  The history is provided by the patient and medical records. No language interpreter was used.  Urinary Frequency  Pertinent negatives include no abdominal pain, no headaches and no shortness of breath.  Hyperglycemia  Associated symptoms: increased thirst and polyuria   Associated symptoms: no abdominal pain, no dysuria, no fever, no nausea, no shortness of breath and no vomiting    Richard [name] is a 61 y.o. male  with a PMH of DM who presents to the Emergency Department for evaluation of hyperglycemia. Patient states that his blood sugar meter read "high" both yesterday and today, prompting him to seek care in the ER. He has been experiencing dry mouth, fatigue, polyuria, polydipsia for the last week. He currently takes 10 mg of glipizide twice a day and has been taking this as directed, however he did not take any medication this morning because of his plan to come to the ER today. He has been on Lantus in the past, however states that he had a few episodes of hypoglycemia on this medication and he has quit taking it. Patient states that he has not been following his typical diet with the holiday season and eating much more sweets than he usually does. No n/v, abdominal pain, change in mental status, fevers/chills, cough, congestion, recent illness, recent steroid use.    Past Medical History:  Diagnosis Date  . Arthritis   . Diabetes mellitus    diet controlled  . GERD (gastroesophageal reflux disease)   . Hypertension   . Migraines   . Neuropathy ([place])   . Ruptured lumbar disc   . Stroke [facility])     Patient Active Problem List   Diagnosis Date Noted  . Hyperglycemia 11/29/2014  . Stroke ([place]) 08/26/2012  . Hyperlipidemia 08/25/2012    . Stage 3 chronic renal impairment associated with type 2 diabetes mellitus ([place]) 08/23/2012  . Tobacco abuse 08/23/2012  . Protein-calorie malnutrition, mild ([place]) 08/23/2012  . Type 2 diabetes mellitus ([place]) 08/23/2012  . 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 Hypertensive emergency 05/21/2012    Past Surgical History:  Procedure Laterality Date  . KNEE SURGERY     bil       Home Medications    Prior to Admission medications   Medication Sig Start Date End Date Taking? Authorizing Provider  glipiZIDE (GLUCOTROL XL) 5 MG 24 hr tablet Take 1 tablet (5 mg total) by mouth daily with breakfast. For diabetes Patient taking differently: Take 5 mg by mouth 2 (two) times daily. For diabetes 09/09/12  Yes Pattricia Boss, MD  insulin glargine (LANTUS) 100 unit/mL SOPN Inject 12 Units into the skin daily.   Yes Historical Provider, MD  lidocaine (LMX) 4 % cream Apply 1 application topically 3 (three) times daily as needed. 08/10/16  Yes Nicole Pisciotta, PA-C  pravastatin (PRAVACHOL) 80 MG tablet Take 80 mg by mouth at bedtime.   Yes Historical Provider, MD  sildenafil (VIAGRA) 100 MG tablet Take 100 mg by mouth as needed for erectile dysfunction. Take 1 hour prior to sexual activity-- do not exceed 1 dose per 24 hour period.   Yes Historical Provider, MD  clindamycin (CLEOCIN) 300 MG capsule Take 1 capsule (  300 mg total) by mouth 3 (three) times daily. X 7 days Patient not taking: Reported on 09/25/2016 01/07/16   April Palumbo, MD  clopidogrel (PLAVIX) 75 MG tablet Take 1 tablet (75 mg total) by mouth daily with breakfast. Patient not taking: Reported on 09/25/2016 09/09/12   Pattricia Boss, MD  hydrALAZINE (APRESOLINE) 10 MG tablet Take 1 tablet (10 mg total) by mouth 3 (three) times daily. Patient not taking: Reported on 09/25/2016 09/09/12   Pattricia Boss, MD  HYDROcodone-acetaminophen (NORCO) 5-325 MG tablet Take 1 tablet by mouth every 6 (six) hours as  needed. Patient not taking: Reported on 09/25/2016 01/07/16   April Palumbo, MD  labetalol (NORMODYNE) 200 MG tablet Take 1 tablet (200 mg total) by mouth 3 (three) times daily. For blood pressure. Note change in dose. Patient not taking: Reported on 09/25/2016 09/09/12   Pattricia Boss, MD    Family History Family History  Problem Relation Age of Onset  . Stroke Father   . Hypertension Father     Social History Social History  Substance Use Topics  . Smoking status: Current Every Day Smoker    Packs/day: 0.50    Types: Cigarettes  . Smokeless tobacco: Never Used  . Alcohol use No     Allergies   Patient has no known allergies.   Review of Systems Review of Systems  Constitutional: Negative for chills and fever.  HENT: Negative for congestion.   Respiratory: Negative for cough and shortness of breath.   Cardiovascular: Negative.   Gastrointestinal: Negative for abdominal pain, nausea and vomiting.  Endocrine: Positive for polydipsia and polyuria.  Genitourinary: Positive for frequency. Negative for dysuria.  Musculoskeletal: Negative for back pain and neck pain.  Skin: Negative for rash.  Neurological: Negative for headaches.     Physical Exam Updated Vital Signs BP 156/92 (BP Location: Right Arm)   Pulse 75   Temp 98.3 F (36.8 C) (Oral)   Resp 16   Ht 6' (1.829 m)   Wt 83.9 kg   SpO2 100%   BMI 25.09 kg/m   Physical Exam  Constitutional: He is oriented to person, place, and time. He appears well-developed and well-nourished. No distress.  HENT:  Head: Normocephalic and atraumatic.  Cardiovascular: Normal rate, regular rhythm and normal heart sounds.   No murmur heard. Pulmonary/Chest: Effort normal and breath sounds normal. No respiratory distress.  Abdominal: Soft. He exhibits no distension. There is no tenderness.  Musculoskeletal: He exhibits no edema.  Neurological: He is alert and oriented to person, place, and time.  Skin: Skin is warm and dry.   Nursing note and vitals reviewed.    ED Treatments / Results  Labs (all labs ordered are listed, but only abnormal results are displayed) Labs Reviewed  BASIC METABOLIC PANEL - Abnormal; Notable for the following:       Result Value   Sodium 125 (*)    Chloride 89 (*)    Glucose, Bld 714 (*)    BUN 30 (*)    Creatinine, Ser 3.29 (*)    GFR calc non Af Amer 19 (*)    GFR calc Af Amer 22 (*)    All other components within normal limits  URINALYSIS, ROUTINE W REFLEX MICROSCOPIC - Abnormal; Notable for the following:    Color, Urine STRAW (*)    Glucose, UA >=500 (*)    Protein, ur 100 (*)    Squamous Epithelial / LPF 0-5 (*)    All other components within normal limits  CBG MONITORING, ED - Abnormal; Notable for the following:    Glucose-Capillary >600 (*)    All other components within normal limits  CBG MONITORING, ED - Abnormal; Notable for the following:    Glucose-Capillary >600 (*)    All other components within normal limits  I-STAT CHEM 8, ED - Abnormal; Notable for the following:    Sodium 129 (*)    Chloride 92 (*)    BUN 32 (*)    Creatinine, Ser 2.90 (*)    Glucose, Bld 599 (*)    Calcium, Ion 1.13 (*)    All other components within normal limits  CBG MONITORING, ED - Abnormal; Notable for the following:    Glucose-Capillary 539 (*)    All other components within normal limits  CBC  CBG MONITORING, ED    EKG  EKG Interpretation None       Radiology No results found.  Procedures Procedures (including critical care time)  Medications Ordered in ED Medications  sodium chloride 0.9 % bolus 1,000 mL (0 mLs Intravenous Stopped 09/25/16 1722)  insulin glargine (LANTUS) injection 12 Units (12 Units Subcutaneous Given 09/25/16 1712)  sodium chloride 0.9 % bolus 1,000 mL (0 mLs Intravenous Stopped 09/25/16 2026)     Initial Impression / Assessment and Plan / ED Course  I have reviewed the triage vital signs and the nursing notes.  Pertinent labs &  imaging results that were available during my care of the patient were reviewed by me and considered in my medical decision making (see chart for details).  Clinical Course    Richard [name] is a 61 y.o. male who presents to ED for elevated blood sugar reading associated with polyuria, polydipsia. Blood sugar of 714 with hyponatremia of 125 and normal anion gap. He does have a mild bump in his baseline creatinine at 3.29. There are no ketones in the urine. Patient is having no abdominal pain, nausea vomiting or change in mental status. Doubt DKA. 12U lantus and IV fluids given. Repeats CBG after 1L down to 539. Chem 8 with improving creatitine and na/cl. Another L of fluids given. Offered patient admission for further control of hyperglycemia, however patient states that he would like to go home. Discussed risks and benefits of admission vs. Home but patient still would like to be discharged. He has a follow up appointment with PCP early next week and understands to keep scheduled appointment. Diet modifications discussed and patient encouraged to monitor blood sugars closely, take DM meds as directed and adhere to recommended diet. Strict return precautions discussed. All questions answered.   Patient discussed with Dr. Dayna Barker who agrees with treatment plan.    Final Clinical Impressions(s) / ED Diagnoses   Final diagnoses:  Hyperglycemia    New Prescriptions New Prescriptions   No medications on file     [facility] Chamya Hunton, PA-C 09/25/16 2032    Merrily Pew, MD 09/26/16 [phone]

## 2016-09-25 NOTE — ED Triage Notes (Signed)
Pt in c/o urinary frequency & hyperglycemia, pt states, " I checked my sugar and it was too high to read. I have been sluggish, thirsty & peeing all the time." pt takes Glipizide 10  Mg  BID daily last took medication yesterday, A&O x4

## 2017-02-25 ENCOUNTER — Emergency Department (HOSPITAL_BASED_OUTPATIENT_CLINIC_OR_DEPARTMENT_OTHER)
Admission: EM | Admit: 2017-02-25 | Discharge: 2017-02-25 | Disposition: A | Discharge status: Home / Self Care | Payer: No Typology Code available for payment source | Attending: Emergency Medicine | Admitting: Emergency Medicine

## 2017-02-25 ENCOUNTER — Encounter (HOSPITAL_BASED_OUTPATIENT_CLINIC_OR_DEPARTMENT_OTHER): Payer: Self-pay

## 2017-02-25 DIAGNOSIS — Y9389 Activity, other specified: Secondary | ICD-10-CM | POA: Diagnosis not present

## 2017-02-25 DIAGNOSIS — F17210 Nicotine dependence, cigarettes, uncomplicated: Secondary | ICD-10-CM | POA: Diagnosis not present

## 2017-02-25 DIAGNOSIS — Y999 Unspecified external cause status: Secondary | ICD-10-CM | POA: Insufficient documentation

## 2017-02-25 DIAGNOSIS — S161XXA Strain of muscle, fascia and tendon at neck level, initial encounter: Secondary | ICD-10-CM | POA: Diagnosis not present

## 2017-02-25 DIAGNOSIS — Y92488 Other paved roadways as the place of occurrence of the external cause: Secondary | ICD-10-CM | POA: Insufficient documentation

## 2017-02-25 DIAGNOSIS — I10 Essential (primary) hypertension: Secondary | ICD-10-CM | POA: Diagnosis not present

## 2017-02-25 DIAGNOSIS — E119 Type 2 diabetes mellitus without complications: Secondary | ICD-10-CM | POA: Insufficient documentation

## 2017-02-25 DIAGNOSIS — S199XXA Unspecified injury of neck, initial encounter: Secondary | ICD-10-CM | POA: Diagnosis present
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
## 2017-02-25 NOTE — Discharge Instructions (Signed)
Follow-up with your primary care doctor in 24-48 hours.   Take your previously prescribed pain medication.   As we discussed you can apply ice for the first 48 hours, after that you can apply heat.   Return the emergency Department for any worsening pain, fever, difficulty walking, numbness/weakness of her arms or legs, urinary or bowel accidents, difficulty breathing or any other worsening or concerning symptoms.

## 2017-03-18 ENCOUNTER — Emergency Department (HOSPITAL_BASED_OUTPATIENT_CLINIC_OR_DEPARTMENT_OTHER): Payer: Medicare HMO

## 2017-03-18 ENCOUNTER — Emergency Department (HOSPITAL_BASED_OUTPATIENT_CLINIC_OR_DEPARTMENT_OTHER)
Admission: EM | Admit: 2017-03-18 | Discharge: 2017-03-18 | Disposition: A | Discharge status: Home / Self Care | Payer: Medicare HMO | Attending: Emergency Medicine | Admitting: Emergency Medicine

## 2017-03-18 ENCOUNTER — Encounter (HOSPITAL_BASED_OUTPATIENT_CLINIC_OR_DEPARTMENT_OTHER): Payer: Self-pay

## 2017-03-18 DIAGNOSIS — R1013 Epigastric pain: Secondary | ICD-10-CM | POA: Diagnosis present

## 2017-03-18 DIAGNOSIS — E1165 Type 2 diabetes mellitus with hyperglycemia: Secondary | ICD-10-CM | POA: Insufficient documentation

## 2017-03-18 DIAGNOSIS — K3184 Gastroparesis: Secondary | ICD-10-CM | POA: Diagnosis not present

## 2017-03-18 DIAGNOSIS — F17210 Nicotine dependence, cigarettes, uncomplicated: Secondary | ICD-10-CM | POA: Insufficient documentation

## 2017-03-18 DIAGNOSIS — I10 Essential (primary) hypertension: Secondary | ICD-10-CM | POA: Diagnosis not present

## 2017-03-18 DIAGNOSIS — Z794 Long term (current) use of insulin: Secondary | ICD-10-CM | POA: Insufficient documentation

## 2017-03-18 DIAGNOSIS — Z8673 Personal history of transient ischemic attack (TIA), and cerebral infarction without residual deficits: Secondary | ICD-10-CM | POA: Insufficient documentation

## 2017-03-18 DIAGNOSIS — R739 Hyperglycemia, unspecified: Secondary | ICD-10-CM

## 2017-03-18 LAB — CBC WITH DIFFERENTIAL/PLATELET
BASOS PCT: 0 %
Basophils Absolute: 0 10*3/uL (ref 0.0–0.1)
Eosinophils Absolute: 0.1 10*3/uL (ref 0.0–0.7)
Eosinophils Relative: 2 %
HEMATOCRIT: 38.7 % — AB (ref 39.0–52.0)
HEMOGLOBIN: 14.1 g/dL (ref 13.0–17.0)
LYMPHS PCT: 26 %
Lymphs Abs: 1.4 10*3/uL (ref 0.7–4.0)
MCH: 30.7 pg (ref 26.0–34.0)
MCHC: 36.4 g/dL — AB (ref 30.0–36.0)
MCV: 84.3 fL (ref 78.0–100.0)
MONOS PCT: 9 %
Monocytes Absolute: 0.5 10*3/uL (ref 0.1–1.0)
NEUTROS ABS: 3.2 10*3/uL (ref 1.7–7.7)
Neutrophils Relative %: 63 %
Platelets: 196 10*3/uL (ref 150–400)
RBC: 4.59 MIL/uL (ref 4.22–5.81)
RDW: 12.7 % (ref 11.5–15.5)
Smear Review: ADEQUATE
WBC: 5.2 10*3/uL (ref 4.0–10.5)

## 2017-03-18 LAB — COMPREHENSIVE METABOLIC PANEL
ALK PHOS: 122 U/L (ref 38–126)
ALT: 8 U/L — ABNORMAL LOW (ref 17–63)
ANION GAP: 10 (ref 5–15)
AST: 15 U/L (ref 15–41)
Albumin: 3.4 g/dL — ABNORMAL LOW (ref 3.5–5.0)
BILIRUBIN TOTAL: 0.5 mg/dL (ref 0.3–1.2)
BUN: 28 mg/dL — ABNORMAL HIGH (ref 6–20)
CALCIUM: 8.5 mg/dL — AB (ref 8.9–10.3)
CO2: 25 mmol/L (ref 22–32)
Chloride: 92 mmol/L — ABNORMAL LOW (ref 101–111)
Creatinine, Ser: 3.23 mg/dL — ABNORMAL HIGH (ref 0.61–1.24)
GFR, EST AFRICAN AMERICAN: 23 mL/min — AB (ref >60)
GFR, EST NON AFRICAN AMERICAN: 19 mL/min — AB (ref >60)
GLUCOSE: 712 mg/dL — AB (ref 65–99)
Potassium: 4.6 mmol/L (ref 3.5–5.1)
Sodium: 127 mmol/L — ABNORMAL LOW (ref 135–145)
TOTAL PROTEIN: 6.7 g/dL (ref 6.5–8.1)

## 2017-03-18 LAB — URINALYSIS, ROUTINE W REFLEX MICROSCOPIC
BILIRUBIN URINE: NEGATIVE
Glucose, UA: >=500 mg/dL — AB
Hgb urine dipstick: NEGATIVE
KETONES UR: NEGATIVE mg/dL
Leukocytes, UA: NEGATIVE
NITRITE: NEGATIVE
PH: 5.5 (ref 5.0–8.0)
Protein, ur: 30 mg/dL — AB
Specific Gravity, Urine: 1.024 (ref 1.005–1.030)

## 2017-03-18 LAB — URINALYSIS, MICROSCOPIC (REFLEX)
RBC / HPF: NONE SEEN RBC/hpf (ref 0–5)
Squamous Epithelial / LPF: NONE SEEN

## 2017-03-18 LAB — CBG MONITORING, ED: Glucose-Capillary: 560 mg/dL (ref 65–99)

## 2017-03-18 LAB — LIPASE, BLOOD: Lipase: 63 U/L — ABNORMAL HIGH (ref 11–51)

## 2017-03-18 MED ORDER — PANTOPRAZOLE SODIUM 40 MG PO TBEC
40.0000 mg | DELAYED_RELEASE_TABLET | Freq: Once | ORAL | Status: AC
Start: 2017-03-18 — End: 2017-03-18
  Administered 2017-03-18: 40 mg via ORAL
  Filled 2017-03-18: qty 1

## 2017-03-18 MED ORDER — METOCLOPRAMIDE HCL 10 MG PO TABS: 10.0000 mg | ORAL_TABLET | Freq: Three times a day (TID) | ORAL | 0 refills | Status: DC

## 2017-03-18 MED ORDER — SODIUM CHLORIDE 0.9 % IV BOLUS (SEPSIS)
1000.0000 mL | Freq: Once | INTRAVENOUS | Status: AC
  Administered 2017-03-18: 1000 mL via INTRAVENOUS

## 2017-03-18 MED ORDER — GLIPIZIDE 5 MG PO TABS
5.0000 mg | ORAL_TABLET | Freq: Once | ORAL | Status: DC
  Filled 2017-03-18: qty 1

## 2017-03-18 MED ORDER — PANTOPRAZOLE SODIUM 20 MG PO TBEC: 20.0000 mg | DELAYED_RELEASE_TABLET | Freq: Every day | ORAL | 0 refills | Status: DC

## 2017-03-18 MED ORDER — INSULIN ASPART 100 UNIT/ML ~~LOC~~ SOLN
10.0000 [IU] | Freq: Once | SUBCUTANEOUS | Status: AC
  Administered 2017-03-18: 10 [IU] via SUBCUTANEOUS
  Filled 2017-03-18: qty 1

## 2017-03-18 MED ORDER — GLIPIZIDE 5 MG PO TABS: 5.0000 mg | ORAL_TABLET | Freq: Every day | ORAL | 0 refills | Status: DC

## 2017-03-18 NOTE — ED Triage Notes (Signed)
C/o abd/back pain x 3-4 weeks-denies n/v/d-NAD-presents to triage in w/c

## 2017-03-18 NOTE — ED Notes (Signed)
Patient transported to CT 

## 2017-03-18 NOTE — ED Provider Notes (Signed)
[facility] DEPT [facility] Provider Note   CSN: [id number] Arrival date & time: 03/18/17  1603     History   Chief Complaint Chief Complaint  Patient presents with  . Abdominal Pain    HPI Richard [name] is a 61 y.o. male. Chief complaint is abdominal pain, and vomiting  HPI:  61 year old male. History of insulin-dependent type 2 diabetes. History of question compliance. Describes abdominal pain after every meal. Feels bloated and distended. Occasional emesis. He is on insulin. He states he takes 70/3020 5 in the morning, and 5 at night. States he was taken off of glipizide by his primary care physician a few months ago, he does not know why. Has a history of renal insufficiency. He smokes. He denies alcohol. Is not on anti-inflammatories, recent steroids, or antibiotics. No excessive caffeine.  No history of gallbladder disease. His pain is epigastric and described as a fullness or bloating. He describes a colicky or right upper quadrant pain. Normal bowel habits.  Past Medical History:  Diagnosis Date  . Arthritis   . Diabetes mellitus    diet controlled  . GERD (gastroesophageal reflux disease)   . Hypertension   . Migraines   . Neuropathy   . Ruptured lumbar disc   . Stroke [facility])     Patient Active Problem List   Diagnosis Date Noted  . Hyperglycemia 11/29/2014  . Stroke ([place]) 08/26/2012  . Hyperlipidemia 08/25/2012  . Stage 3 chronic renal impairment associated with type 2 diabetes mellitus ([place]) 08/23/2012  . Tobacco abuse 08/23/2012  . Protein-calorie malnutrition, mild ([place]) 08/23/2012  . Type 2 diabetes mellitus ([place]) 08/23/2012  . 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 Hypertensive emergency 05/21/2012    Past Surgical History:  Procedure Laterality Date  . KNEE SURGERY     bil       Home Medications    Prior to Admission medications   Medication Sig Start Date End Date Taking? Authorizing Provider  CLONIDINE  HCL PO Take by mouth.   Yes [provider]  glipiZIDE (GLUCOTROL) 5 MG tablet Take 1 tablet (5 mg total) by mouth daily before breakfast. 03/18/17   Tanna Furry, MD  insulin glargine (LANTUS) 100 unit/mL SOPN Inject 12 Units into the skin daily.    [provider]  metoCLOPramide (REGLAN) 10 MG tablet Take 1 tablet (10 mg total) by mouth 4 (four) times daily -  before meals and at bedtime. 03/18/17   Tanna Furry, MD  pantoprazole (PROTONIX) 20 MG tablet Take 1 tablet (20 mg total) by mouth daily. 03/18/17   Tanna Furry, MD  pravastatin (PRAVACHOL) 80 MG tablet Take 80 mg by mouth at bedtime.    [provider]  sildenafil (VIAGRA) 100 MG tablet Take 100 mg by mouth as needed for erectile dysfunction. Take 1 hour prior to sexual activity-- do not exceed 1 dose per 24 hour period.    [provider]  UNKNOWN TO PATIENT PT DOES NOT KNOW COMPLETE LIST OF MEDS    [provider]    Family History Family History  Problem Relation Age of Onset  . Stroke Father   . Hypertension Father     Social History Social History  Substance Use Topics  . Smoking status: Current Every Day Smoker    Packs/day: 0.50    Types: Cigarettes  . Smokeless tobacco: Never Used  . Alcohol use No     Allergies   Patient has no known allergies.  Review of Systems Review of Systems  Constitutional: Negative for appetite change, chills, diaphoresis, fatigue and fever.  HENT: Negative for mouth sores, sore throat and trouble swallowing.   Eyes: Negative for visual disturbance.  Respiratory: Negative for cough, chest tightness, shortness of breath and wheezing.   Cardiovascular: Negative for chest pain.  Gastrointestinal: Positive for abdominal distention, abdominal pain, nausea and vomiting. Negative for diarrhea.  Endocrine: Negative for polydipsia, polyphagia and polyuria.  Genitourinary: Negative for dysuria, frequency and hematuria.  Musculoskeletal: Negative for  gait problem.  Skin: Negative for color change, pallor and rash.  Neurological: Negative for dizziness, syncope, light-headedness and headaches.  Hematological: Does not bruise/bleed easily.  Psychiatric/Behavioral: Negative for behavioral problems and confusion.     Physical Exam Updated Vital Signs BP (!) 167/94 (BP Location: Right Arm)   Pulse 68   Temp 98.8 F (37.1 C) (Oral)   Resp 16   Ht 6' (1.829 m)   Wt 83.9 kg (185 lb)   SpO2 95%   BMI 25.09 kg/m   Physical Exam  Constitutional: He is oriented to person, place, and time. He appears well-developed and well-nourished. No distress.  HENT:  Head: Normocephalic.  Eyes: Conjunctivae are normal. Pupils are equal, round, and reactive to light. No scleral icterus.  Neck: Normal range of motion. Neck supple. No thyromegaly present.  Cardiovascular: Normal rate and regular rhythm.  Exam reveals no gallop and no friction rub.   No murmur heard. Pulmonary/Chest: Effort normal and breath sounds normal. No respiratory distress. He has no wheezes. He has no rales.  Abdominal: Soft. Bowel sounds are normal. He exhibits no distension. There is tenderness in the epigastric area. There is no rebound.    Musculoskeletal: Normal range of motion.  Neurological: He is alert and oriented to person, place, and time.  Skin: Skin is warm and dry. No rash noted.  Psychiatric: He has a normal mood and affect. His behavior is normal.     ED Treatments / Results  Labs (all labs ordered are listed, but only abnormal results are displayed) Labs Reviewed  CBC WITH DIFFERENTIAL/PLATELET - Abnormal; Notable for the following:       Result Value   HCT 38.7 (*)    MCHC 36.4 (*)    All other components within normal limits  COMPREHENSIVE METABOLIC PANEL - Abnormal; Notable for the following:    Sodium 127 (*)    Chloride 92 (*)    Glucose, Bld 712 (*)    BUN 28 (*)    Creatinine, Ser 3.23 (*)    Calcium 8.5 (*)    Albumin 3.4 (*)    ALT 8  (*)    GFR calc non Af Amer 19 (*)    GFR calc Af Amer 23 (*)    All other components within normal limits  LIPASE, BLOOD - Abnormal; Notable for the following:    Lipase 63 (*)    All other components within normal limits  URINALYSIS, ROUTINE W REFLEX MICROSCOPIC - Abnormal; Notable for the following:    Glucose, UA >=500 (*)    Protein, ur 30 (*)    All other components within normal limits  URINALYSIS, MICROSCOPIC (REFLEX) - Abnormal; Notable for the following:    Bacteria, UA RARE (*)    All other components within normal limits  CBG MONITORING, ED - Abnormal; Notable for the following:    Glucose-Capillary 560 (*)    All other components within normal limits    EKG  EKG  Interpretation None       Radiology Ct Renal Stone Study  Result Date: 03/18/2017 CLINICAL DATA:  61 year old male with abdominal pain for 3 weeks. EXAM: CT ABDOMEN AND PELVIS WITHOUT CONTRAST TECHNIQUE: Multidetector CT imaging of the abdomen and pelvis was performed following the standard protocol without IV contrast. COMPARISON:  01/07/2010 CT and prior studies FINDINGS: Please note that parenchymal abnormalities may be missed without intravenous contrast. Lower chest: No acute abnormality Hepatobiliary: The liver and gallbladder are unremarkable. There is no evidence of biliary dilatation. Pancreas: Unremarkable Spleen: Unremarkable Adrenals/Urinary Tract: The kidneys, adrenal glands and bladder are unremarkable except for a unchanged right renal cyst. There is no evidence of hydronephrosis or urinary calculi. Stomach/Bowel: Stomach is within normal limits. Appendix appears normal. No evidence of bowel wall thickening, distention, or inflammatory changes. Vascular/Lymphatic: Aortic atherosclerosis. No enlarged abdominal or pelvic lymph nodes. Reproductive: Prostate enlargement again noted. Other: No ascites, focal collection or pneumoperitoneum. Musculoskeletal: No acute or significant osseous findings.  IMPRESSION: No evidence of acute abnormality. Prostate enlargement. Aortic Atherosclerosis (ICD10-I70.0). Electronically Signed   By: Margarette Canada M.D.   On: 03/18/2017 18:17    Procedures Procedures (including critical care time)  Medications Ordered in ED Medications  glipiZIDE (GLUCOTROL) tablet 5 mg (not administered)  sodium chloride 0.9 % bolus 1,000 mL (1,000 mLs Intravenous New Bag/Given 03/18/17 1755)  insulin aspart (novoLOG) injection 10 Units (10 Units Subcutaneous Given 03/18/17 1757)  pantoprazole (PROTONIX) EC tablet 40 mg (40 mg Oral Given 03/18/17 1855)     Initial Impression / Assessment and Plan / ED Course  I have reviewed the triage vital signs and the nursing notes.  Pertinent labs & imaging results that were available during my care of the patient were reviewed by me and considered in my medical decision making (see chart for details).     Differential diagnoses biliary colic, peptic ulcer disease, gastritis, gastroparesis. Doubt pancreatitis, hepatitis.  He is hyperglycemic but not acidotic. Sugar over 700. Decreases to 500 after fluids and insulin. Given glipizide. CT abdomen shows no acute abnormalities. Renal insufficiency at baseline. Strongly encouraged him to follow-up with his primary care. We'll restart his glipizide. Symptoms are consistent with gastroparesis with distention and occasional was prandial fullness and emesis. Plan Protonix, Reglan, GI follow-up. Small meals. Glipizide. Primary care soon as possible to discuss his treatment regimen for his diabetes.  Final Clinical Impressions(s) / ED Diagnoses   Final diagnoses:  Epigastric pain  Hyperglycemia  Gastroparesis    New Prescriptions New Prescriptions   GLIPIZIDE (GLUCOTROL) 5 MG TABLET    Take 1 tablet (5 mg total) by mouth daily before breakfast.   METOCLOPRAMIDE (REGLAN) 10 MG TABLET    Take 1 tablet (10 mg total) by mouth 4 (four) times daily -  before meals and at bedtime.    PANTOPRAZOLE (PROTONIX) 20 MG TABLET    Take 1 tablet (20 mg total) by mouth daily.     Tanna Furry, MD 03/18/17 Dorthula Perfect

## 2017-03-18 NOTE — ED Notes (Signed)
Date and time results received: 03/18/17 1750  Test: glucose Critical Value: 714  Name of Provider Notified: Dr. Jeneen Rinks  Orders Received? yes

## 2017-03-18 NOTE — Discharge Instructions (Signed)
Restart Glipizide.  Call your physician for appointment ASAP--your sugars are very high Small meals, Reglan before meals.  Protonix daily.

## 2017-08-25 ENCOUNTER — Encounter (HOSPITAL_BASED_OUTPATIENT_CLINIC_OR_DEPARTMENT_OTHER): Payer: Self-pay | Admitting: Emergency Medicine

## 2017-08-25 ENCOUNTER — Other Ambulatory Visit: Payer: Self-pay

## 2017-08-25 ENCOUNTER — Emergency Department (HOSPITAL_BASED_OUTPATIENT_CLINIC_OR_DEPARTMENT_OTHER)
Admission: EM | Admit: 2017-08-25 | Discharge: 2017-08-25 | Disposition: A | Discharge status: Home / Self Care | Payer: Medicare HMO | Attending: Emergency Medicine | Admitting: Emergency Medicine

## 2017-08-25 DIAGNOSIS — J00 Acute nasopharyngitis [common cold]: Secondary | ICD-10-CM | POA: Insufficient documentation

## 2017-08-25 DIAGNOSIS — N183 Chronic kidney disease, stage 3 (moderate): Secondary | ICD-10-CM | POA: Diagnosis not present

## 2017-08-25 DIAGNOSIS — I129 Hypertensive chronic kidney disease with stage 1 through stage 4 chronic kidney disease, or unspecified chronic kidney disease: Secondary | ICD-10-CM | POA: Diagnosis not present

## 2017-08-25 DIAGNOSIS — Z794 Long term (current) use of insulin: Secondary | ICD-10-CM | POA: Insufficient documentation

## 2017-08-25 DIAGNOSIS — E1122 Type 2 diabetes mellitus with diabetic chronic kidney disease: Secondary | ICD-10-CM | POA: Insufficient documentation

## 2017-08-25 DIAGNOSIS — F17210 Nicotine dependence, cigarettes, uncomplicated: Secondary | ICD-10-CM | POA: Diagnosis not present

## 2017-08-25 DIAGNOSIS — H60391 Other infective otitis externa, right ear: Secondary | ICD-10-CM | POA: Diagnosis not present

## 2017-08-25 DIAGNOSIS — Z79899 Other long term (current) drug therapy: Secondary | ICD-10-CM | POA: Insufficient documentation

## 2017-08-25 DIAGNOSIS — H9203 Otalgia, bilateral: Secondary | ICD-10-CM | POA: Diagnosis present

## 2017-08-25 MED ORDER — BENZONATATE 100 MG PO CAPS: 100.0000 mg | ORAL_CAPSULE | Freq: Three times a day (TID) | ORAL | 0 refills | Status: DC

## 2017-08-25 MED ORDER — CLINDAMYCIN HCL 150 MG PO CAPS: 450.0000 mg | ORAL_CAPSULE | Freq: Three times a day (TID) | ORAL | 0 refills | Status: AC

## 2017-08-25 MED ORDER — OFLOXACIN 0.3 % OT SOLN
10.0000 [drp] | Freq: Every day | OTIC | 0 refills | Status: AC
Start: 2017-08-25 — End: 2017-09-01

## 2017-08-25 MED FILL — CLINDAMYCIN HCL 150 MG CAPS: 150 | 10 days supply | Qty: 90 | Fill #0

## 2017-08-25 MED FILL — BENZONATATE 100 MG CAPSULE: 100 | 7 days supply | Qty: 21 | Fill #0

## 2017-08-25 MED FILL — OFLOXACIN 0.3% EAR DROPS: 0.3 | 7 days supply | Qty: 5 | Fill #0

## 2017-08-25 NOTE — ED Triage Notes (Signed)
Pt states he has been having nasal congestion, chest congestion and ear pain for two weeks.  No known fever, some chills.  Productive white spututm.  Bilateral red conjunctiva with yellow drainage.

## 2017-08-25 NOTE — Discharge Instructions (Signed)
Take tylenol 2 pills 4 times a day.  Drink plenty of fluids.  Return for worsening shortness of breath, headache, confusion. Follow up with your family doctor.     

## 2017-08-25 NOTE — ED Provider Notes (Signed)
[place] EMERGENCY DEPARTMENT Provider Note   CSN: [id number] Arrival date & time: 08/25/17  1241     History   Chief Complaint Chief Complaint  Patient presents with  . Otalgia  . Cough    HPI Richard [name] is a 61 y.o. male.  61 yo M with a chief complaint of right ear pain.  The patient has had cough congestion and bilateral ear pain for about a week.  Has gotten worse to the right ear.  He has pain when he touches the ear.  Denies any water that is been stuck in there.  Denies recent swimming.  Denies drainage.  He has been sticking hair pins into his ear to scrape out earwax.  He says he was told not to use a Q-tip and so has been using hairpins instead.   The history is provided by the patient.  Otalgia  This is a new problem. The current episode started more than 1 week ago. There is pain in both ears. The problem occurs constantly. The problem has been gradually worsening. There has been no fever. The pain is at a severity of 8/10. The pain is moderate. Associated symptoms include cough. Pertinent negatives include no headaches, no abdominal pain, no diarrhea, no vomiting and no rash.  Cough  Associated symptoms include ear pain. Pertinent negatives include no chest pain, no chills, no headaches, no myalgias and no shortness of breath.    Past Medical History:  Diagnosis Date  . Arthritis   . Diabetes mellitus    diet controlled  . GERD (gastroesophageal reflux disease)   . Hypertension   . Migraines   . Neuropathy   . Ruptured lumbar disc   . Stroke [facility])     Patient Active Problem List   Diagnosis Date Noted  . Hyperglycemia 11/29/2014  . Stroke ([place]) 08/26/2012  . Hyperlipidemia 08/25/2012  . Stage 3 chronic renal impairment associated with type 2 diabetes mellitus ([place]) 08/23/2012  . Tobacco abuse 08/23/2012  . Protein-calorie malnutrition, mild ([place]) 08/23/2012  . Type 2 diabetes mellitus ([place]) 08/23/2012  . 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 Hypertensive emergency 05/21/2012    Past Surgical History:  Procedure Laterality Date  . KNEE SURGERY     bil       Home Medications    Prior to Admission medications   Medication Sig Start Date End Date Taking? Authorizing Provider  amLODipine (NORVASC) 10 MG tablet Take 10 mg by mouth daily.   Yes [provider]  Cholecalciferol (VITAMIN D3) 2000 units TABS Take 1 tablet by mouth every morning.   Yes [provider]  CLONIDINE HCL PO Take 0.2 mg by mouth 2 (two) times daily.    Yes [provider]  clopidogrel (PLAVIX) 75 MG tablet Take 75 mg by mouth daily.   Yes [provider]  Dextromethorphan-Guaifenesin (MUCINEX DM MAXIMUM STRENGTH) 60-1200 MG TB12 Take 1 tablet by mouth 2 (two) times daily as needed.   Yes [provider]  Insulin Aspart Prot & Aspart (NOVOLOG MIX 70/30 FLEXPEN [place]) Inject 25 Units into the skin every morning.   Yes [provider]  insulin aspart protamine- aspart (NOVOLOG MIX 70/30) (70-30) 100 UNIT/ML injection Inject 5 Units into the skin at bedtime.   Yes [provider]  losartan-hydrochlorothiazide (HYZAAR) 100-12.5 MG tablet Take 1 tablet by mouth daily.   Yes [provider]  pantoprazole (PROTONIX) 20 MG tablet Take 1  tablet (20 mg total) by mouth daily. 03/18/17  Yes Tanna Furry, MD  sodium chloride (OCEAN) 0.65 % SOLN nasal spray Place 1 spray into both nostrils as needed for congestion.   Yes [provider]  tamsulosin (FLOMAX) 0.4 MG CAPS capsule Take 0.4 mg by mouth daily.   Yes [provider]  benzonatate (TESSALON) 100 MG capsule Take 1 capsule (100 mg total) by mouth every 8 (eight) hours. 08/25/17   Deno Etienne, DO  clindamycin (CLEOCIN) 150 MG capsule Take 3 capsules (450 mg total) by mouth 3 (three) times daily for 10 days. X 7 days 08/25/17 09/04/17  Deno Etienne, DO  ofloxacin (FLOXIN) 0.3 % OTIC solution  Place 10 drops into the right ear daily for 7 days. 08/25/17 09/01/17  Deno Etienne, DO  pravastatin (PRAVACHOL) 80 MG tablet Take 80 mg by mouth at bedtime.    [provider]    Family History Family History  Problem Relation Age of Onset  . Stroke Father   . Hypertension Father     Social History Social History   Tobacco Use  . Smoking status: Current Every Day Smoker    Packs/day: 0.50    Types: Cigarettes  . Smokeless tobacco: Never Used  Substance Use Topics  . Alcohol use: No  . Drug use: Yes    Types: Marijuana     Allergies   Patient has no known allergies.   Review of Systems Review of Systems  Constitutional: Negative for chills and fever.  HENT: Positive for congestion and ear pain. Negative for facial swelling.   Eyes: Negative for discharge and visual disturbance.  Respiratory: Positive for cough. Negative for shortness of breath.   Cardiovascular: Negative for chest pain and palpitations.  Gastrointestinal: Negative for abdominal pain, diarrhea and vomiting.  Musculoskeletal: Negative for arthralgias and myalgias.  Skin: Negative for color change and rash.  Neurological: Negative for tremors, syncope and headaches.  Psychiatric/Behavioral: Negative for confusion and dysphoric mood.     Physical Exam Updated Vital Signs BP (!) 188/96 (BP Location: Left Arm)   Pulse 87   Temp 98.2 F (36.8 C) (Oral)   Resp 18   Ht 6' (1.829 m)   Wt 86.2 kg (190 lb)   SpO2 100%   BMI 25.77 kg/m   Physical Exam  Constitutional: He is oriented to person, place, and time. He appears well-developed and well-nourished.  HENT:  Head: Normocephalic and atraumatic.  Asymmetric palate elevation.  Posterior nasal drip.  Pain with manipulation of the right tragus.  Along the ear canal the patient has a mass.  It is tender to palpation.  Erythematous.  No noted purulent drainage.  Eyes: EOM are normal. Pupils are equal, round, and reactive to light.  Neck:  Normal range of motion. Neck supple. No JVD present.  Cardiovascular: Normal rate and regular rhythm. Exam reveals no gallop and no friction rub.  No murmur heard. Pulmonary/Chest: No respiratory distress. He has no wheezes.  Abdominal: He exhibits no distension. There is no rebound and no guarding.  Musculoskeletal: Normal range of motion.  Neurological: He is alert and oriented to person, place, and time.  Skin: No rash noted. No pallor.  Psychiatric: He has a normal mood and affect. His behavior is normal.  Nursing note and vitals reviewed.    ED Treatments / Results  Labs (all labs ordered are listed, but only abnormal results are displayed) Labs Reviewed - No data to display  EKG  EKG Interpretation None  Radiology No results found.  Procedures Procedures (including critical care time)  Medications Ordered in ED Medications - No data to display   Initial Impression / Assessment and Plan / ED Course  I have reviewed the triage vital signs and the nursing notes.  Pertinent labs & imaging results that were available during my care of the patient were reviewed by me and considered in my medical decision making (see chart for details).     61 yo M with a chief complaint of bilateral ear pain but worse on the right.  Also with URI-like symptoms for the past week or so.  The patient has what looks like a abscess inside of the right ear canal.  There is some extension of the edema to the anterior aspect of the tragus.  There is no purulent drainage.  I do not feel comfortable performing an I&D in that location.  I will start on topical as well as oral antibiotics.  Give him follow-up for ENT.  2:45 PM:  I have discussed the diagnosis/risks/treatment options with the patient and family and believe the pt to be eligible for discharge home to follow-up with ENT. We also discussed returning to the ED immediately if new or worsening sx occur. We discussed the sx which are  most concerning (e.g., sudden worsening pain, fever, inability to tolerate by mouth) that necessitate immediate return. Medications administered to the patient during their visit and any new prescriptions provided to the patient are listed below.  Medications given during this visit Medications - No data to display   The patient appears reasonably screen and/or stabilized for discharge and I doubt any other medical condition or other [facility] requiring further screening, evaluation, or treatment in the ED at this time prior to discharge.    Final Clinical Impressions(s) / ED Diagnoses   Final diagnoses:  Acute nasopharyngitis  Other infective acute otitis externa of right ear    ED Discharge Orders        Ordered    ofloxacin (FLOXIN) 0.3 % OTIC solution  Daily     08/25/17 1332    clindamycin (CLEOCIN) 150 MG capsule  3 times daily     08/25/17 1332    benzonatate (TESSALON) 100 MG capsule  Every 8 hours     08/25/17 [place], [place], DO 08/25/17 1445

## 2017-09-03 ENCOUNTER — Other Ambulatory Visit: Payer: Self-pay

## 2017-09-03 ENCOUNTER — Emergency Department (HOSPITAL_BASED_OUTPATIENT_CLINIC_OR_DEPARTMENT_OTHER)
Admission: EM | Admit: 2017-09-03 | Discharge: 2017-09-03 | Disposition: A | Discharge status: Home / Self Care | Payer: Medicare HMO | Attending: Physician Assistant | Admitting: Physician Assistant

## 2017-09-03 ENCOUNTER — Encounter (HOSPITAL_BASED_OUTPATIENT_CLINIC_OR_DEPARTMENT_OTHER): Payer: Self-pay | Admitting: Emergency Medicine

## 2017-09-03 ENCOUNTER — Emergency Department (HOSPITAL_BASED_OUTPATIENT_CLINIC_OR_DEPARTMENT_OTHER): Payer: Medicare HMO

## 2017-09-03 DIAGNOSIS — Z79899 Other long term (current) drug therapy: Secondary | ICD-10-CM | POA: Insufficient documentation

## 2017-09-03 DIAGNOSIS — Y999 Unspecified external cause status: Secondary | ICD-10-CM | POA: Diagnosis not present

## 2017-09-03 DIAGNOSIS — Y9389 Activity, other specified: Secondary | ICD-10-CM | POA: Diagnosis not present

## 2017-09-03 DIAGNOSIS — I10 Essential (primary) hypertension: Secondary | ICD-10-CM | POA: Insufficient documentation

## 2017-09-03 DIAGNOSIS — Z794 Long term (current) use of insulin: Secondary | ICD-10-CM | POA: Diagnosis not present

## 2017-09-03 DIAGNOSIS — R1084 Generalized abdominal pain: Secondary | ICD-10-CM | POA: Diagnosis not present

## 2017-09-03 DIAGNOSIS — Y929 Unspecified place or not applicable: Secondary | ICD-10-CM | POA: Insufficient documentation

## 2017-09-03 DIAGNOSIS — Z7901 Long term (current) use of anticoagulants: Secondary | ICD-10-CM | POA: Diagnosis not present

## 2017-09-03 DIAGNOSIS — F17210 Nicotine dependence, cigarettes, uncomplicated: Secondary | ICD-10-CM | POA: Diagnosis not present

## 2017-09-03 DIAGNOSIS — E119 Type 2 diabetes mellitus without complications: Secondary | ICD-10-CM | POA: Diagnosis not present

## 2017-09-03 LAB — COMPREHENSIVE METABOLIC PANEL
ALK PHOS: 113 U/L (ref 38–126)
ALT: 11 U/L — AB (ref 17–63)
ANION GAP: 9 (ref 5–15)
AST: 17 U/L (ref 15–41)
Albumin: 3.6 g/dL (ref 3.5–5.0)
BUN: 22 mg/dL — ABNORMAL HIGH (ref 6–20)
CALCIUM: 8.6 mg/dL — AB (ref 8.9–10.3)
CHLORIDE: 102 mmol/L (ref 101–111)
CO2: 24 mmol/L (ref 22–32)
CREATININE: 2.53 mg/dL — AB (ref 0.61–1.24)
GFR, EST AFRICAN AMERICAN: 30 mL/min — AB (ref >60)
GFR, EST NON AFRICAN AMERICAN: 26 mL/min — AB (ref >60)
Glucose, Bld: 324 mg/dL — ABNORMAL HIGH (ref 65–99)
Potassium: 4 mmol/L (ref 3.5–5.1)
Sodium: 135 mmol/L (ref 135–145)
Total Bilirubin: 0.5 mg/dL (ref 0.3–1.2)
Total Protein: 7.4 g/dL (ref 6.5–8.1)

## 2017-09-03 LAB — CBC WITH DIFFERENTIAL/PLATELET
Basophils Absolute: 0 10*3/uL (ref 0.0–0.1)
Basophils Relative: 0 %
Eosinophils Absolute: 0.1 10*3/uL (ref 0.0–0.7)
Eosinophils Relative: 1 %
HEMATOCRIT: 41 % (ref 39.0–52.0)
Hemoglobin: 14.4 g/dL (ref 13.0–17.0)
Lymphocytes Relative: 15 %
Lymphs Abs: 1.4 10*3/uL (ref 0.7–4.0)
MCH: 30.1 pg (ref 26.0–34.0)
MCHC: 35.1 g/dL (ref 30.0–36.0)
MCV: 85.8 fL (ref 78.0–100.0)
Monocytes Absolute: 0.6 10*3/uL (ref 0.1–1.0)
Monocytes Relative: 7 %
NEUTROS PCT: 77 %
Neutro Abs: 6.8 10*3/uL (ref 1.7–7.7)
PLATELETS: 272 10*3/uL (ref 150–400)
RBC: 4.78 MIL/uL (ref 4.22–5.81)
RDW: 13.3 % (ref 11.5–15.5)
WBC: 9 10*3/uL (ref 4.0–10.5)

## 2017-09-03 LAB — ETHANOL: Alcohol, Ethyl (B): <10 mg/dL (ref <10)

## 2017-09-03 MED ORDER — CYCLOBENZAPRINE HCL 10 MG PO TABS: 10.0000 mg | ORAL_TABLET | Freq: Two times a day (BID) | ORAL | 0 refills | Status: DC | PRN

## 2017-09-03 MED ORDER — ACETAMINOPHEN 325 MG PO TABS
650.0000 mg | ORAL_TABLET | Freq: Once | ORAL | Status: AC
  Administered 2017-09-03: 650 mg via ORAL
  Filled 2017-09-03: qty 2

## 2017-09-03 MED ORDER — SODIUM CHLORIDE 0.9 % IV BOLUS (SEPSIS)
1000.0000 mL | Freq: Once | INTRAVENOUS | Status: AC
  Administered 2017-09-03: 1000 mL via INTRAVENOUS

## 2017-09-03 MED ORDER — IBUPROFEN 800 MG PO TABS: 800.0000 mg | ORAL_TABLET | Freq: Three times a day (TID) | ORAL | 0 refills | Status: DC
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
## 2017-09-03 NOTE — ED Notes (Signed)
Patient transported to CT 

## 2017-09-06 ENCOUNTER — Emergency Department (HOSPITAL_BASED_OUTPATIENT_CLINIC_OR_DEPARTMENT_OTHER): Payer: Medicare HMO

## 2017-09-06 ENCOUNTER — Encounter (HOSPITAL_BASED_OUTPATIENT_CLINIC_OR_DEPARTMENT_OTHER): Payer: Self-pay | Admitting: Emergency Medicine

## 2017-09-06 ENCOUNTER — Emergency Department (HOSPITAL_BASED_OUTPATIENT_CLINIC_OR_DEPARTMENT_OTHER)
Admission: EM | Admit: 2017-09-06 | Discharge: 2017-09-06 | Disposition: A | Discharge status: Home / Self Care | Payer: Medicare HMO | Attending: Emergency Medicine | Admitting: Emergency Medicine

## 2017-09-06 ENCOUNTER — Other Ambulatory Visit: Payer: Self-pay

## 2017-09-06 DIAGNOSIS — E119 Type 2 diabetes mellitus without complications: Secondary | ICD-10-CM | POA: Diagnosis not present

## 2017-09-06 DIAGNOSIS — R05 Cough: Secondary | ICD-10-CM | POA: Diagnosis present

## 2017-09-06 DIAGNOSIS — J209 Acute bronchitis, unspecified: Secondary | ICD-10-CM | POA: Insufficient documentation

## 2017-09-06 DIAGNOSIS — F17210 Nicotine dependence, cigarettes, uncomplicated: Secondary | ICD-10-CM | POA: Diagnosis not present

## 2017-09-06 DIAGNOSIS — Z7902 Long term (current) use of antithrombotics/antiplatelets: Secondary | ICD-10-CM | POA: Insufficient documentation

## 2017-09-06 DIAGNOSIS — Z79899 Other long term (current) drug therapy: Secondary | ICD-10-CM | POA: Diagnosis not present

## 2017-09-06 DIAGNOSIS — J40 Bronchitis, not specified as acute or chronic: Secondary | ICD-10-CM

## 2017-09-06 DIAGNOSIS — Z794 Long term (current) use of insulin: Secondary | ICD-10-CM | POA: Insufficient documentation

## 2017-09-06 DIAGNOSIS — I10 Essential (primary) hypertension: Secondary | ICD-10-CM | POA: Insufficient documentation

## 2017-09-06 MED ORDER — PREDNISONE 20 MG PO TABS: 40.0000 mg | ORAL_TABLET | Freq: Every day | ORAL | 0 refills | Status: DC

## 2017-09-06 MED ORDER — ALBUTEROL SULFATE HFA 108 (90 BASE) MCG/ACT IN AERS
2.0000 | INHALATION_SPRAY | RESPIRATORY_TRACT | Status: DC | PRN
  Administered 2017-09-06: 2 via RESPIRATORY_TRACT
  Filled 2017-09-06: qty 6.7

## 2017-09-06 MED ORDER — HYDROCODONE-HOMATROPINE 5-1.5 MG/5ML PO SYRP
5.0000 mL | ORAL_SOLUTION | Freq: Four times a day (QID) | ORAL | 0 refills | Status: DC | PRN
Start: 2017-09-06 — End: 2018-05-05

## 2017-09-06 NOTE — ED Triage Notes (Signed)
Pt c/o cough x 3 wks; seen here recently for same

## 2017-09-06 NOTE — ED Provider Notes (Signed)
[facility] EMERGENCY DEPARTMENT Provider Note   CSN: [id number] Arrival date & time: 09/06/17  0254     History   Chief Complaint Chief Complaint  Patient presents with  . Cough    HPI Richard [name] is a 61 y.o. male.  The history is provided by the patient.  Cough  This is a new problem. Episode onset: For 3 weeks. The problem occurs constantly. The problem has not changed since onset.Cough characteristics: Mostly nonproductive but occasionally will have some clear sputum. There has been no fever. Associated symptoms include shortness of breath and wheezing. Pertinent negatives include no chest pain, no chills, no sweats, no ear congestion, no ear pain, no headaches, no rhinorrhea and no sore throat. Associated symptoms comments: Intermittent shortness of breath and wheezing.  Seems to be really bad when coughing. Treatments tried: Was given Best boy when he was seen a few weeks ago which she states did not help with the cough. The treatment provided no relief. He is a smoker. His past medical history does not include bronchitis, pneumonia or COPD.    Past Medical History:  Diagnosis Date  . Arthritis   . Diabetes mellitus    diet controlled  . GERD (gastroesophageal reflux disease)   . Hypertension   . Migraines   . Neuropathy   . Ruptured lumbar disc   . Stroke [facility])     Patient Active Problem List   Diagnosis Date Noted  . Hyperglycemia 11/29/2014  . Stroke ([place]) 08/26/2012  . Hyperlipidemia 08/25/2012  . Stage 3 chronic renal impairment associated with type 2 diabetes mellitus ([place]) 08/23/2012  . Tobacco abuse 08/23/2012  . Protein-calorie malnutrition, mild ([place]) 08/23/2012  . Type 2 diabetes mellitus ([place]) 08/23/2012  . 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 Hypertensive emergency 05/21/2012    Past Surgical History:  Procedure Laterality Date  . KNEE SURGERY     bil       Home Medications     Prior to Admission medications   Medication Sig Start Date End Date Taking? Authorizing Provider  amLODipine (NORVASC) 10 MG tablet Take 10 mg by mouth daily.    [provider]  benzonatate (TESSALON) 100 MG capsule Take 1 capsule (100 mg total) by mouth every 8 (eight) hours. 08/25/17   Deno Etienne, DO  Cholecalciferol (VITAMIN D3) 2000 units TABS Take 1 tablet by mouth every morning.    [provider]  CLONIDINE HCL PO Take 0.2 mg by mouth 2 (two) times daily.     [provider]  clopidogrel (PLAVIX) 75 MG tablet Take 75 mg by mouth daily.    [provider]  cyclobenzaprine (FLEXERIL) 10 MG tablet Take 1 tablet (10 mg total) by mouth 2 (two) times daily as needed for muscle spasms. 09/03/17   Mackuen, Courteney Lyn, MD  Dextromethorphan-Guaifenesin (MUCINEX DM MAXIMUM STRENGTH) 60-1200 MG TB12 Take 1 tablet by mouth 2 (two) times daily as needed.    [provider]  ibuprofen (ADVIL,MOTRIN) 800 MG tablet Take 1 tablet (800 mg total) by mouth 3 (three) times daily. 09/03/17   Mackuen, Courteney Lyn, MD  Insulin Aspart Prot & Aspart (NOVOLOG MIX 70/30 FLEXPEN [place]) Inject 25 Units into the skin every morning.    [provider]  insulin aspart protamine- aspart (NOVOLOG MIX 70/30) (70-30) 100 UNIT/ML injection Inject 5 Units into the skin at bedtime.    [provider]  losartan-hydrochlorothiazide (HYZAAR) 100-12.5 MG tablet Take  1 tablet by mouth daily.    [provider]  pantoprazole (PROTONIX) 20 MG tablet Take 1 tablet (20 mg total) by mouth daily. 03/18/17   Tanna Furry, MD  pravastatin (PRAVACHOL) 80 MG tablet Take 80 mg by mouth at bedtime.    [provider]  sodium chloride (OCEAN) 0.65 % SOLN nasal spray Place 1 spray into both nostrils as needed for congestion.    [provider]  tamsulosin (FLOMAX) 0.4 MG CAPS capsule Take 0.4 mg by mouth daily.    [provider]    Family  History Family History  Problem Relation Age of Onset  . Stroke Father   . Hypertension Father     Social History Social History   Tobacco Use  . Smoking status: Current Every Day Smoker    Packs/day: 0.50    Types: Cigarettes  . Smokeless tobacco: Never Used  Substance Use Topics  . Alcohol use: No  . Drug use: Yes    Types: Marijuana     Allergies   Patient has no known allergies.   Review of Systems Review of Systems  Constitutional: Negative for chills.  HENT: Negative for ear pain, rhinorrhea and sore throat.   Respiratory: Positive for cough, shortness of breath and wheezing.   Cardiovascular: Negative for chest pain.  Neurological: Negative for headaches.  All other systems reviewed and are negative.    Physical Exam Updated Vital Signs BP (!) 159/80 (BP Location: Left Arm) Comment: Pt has not taken BP medication this AM.  Pulse 86   Temp 98.4 F (36.9 C) (Oral)   Resp 20   Ht 6' (1.829 m)   Wt 86.2 kg (190 lb)   SpO2 100%   BMI 25.77 kg/m   Physical Exam  Constitutional: He is oriented to person, place, and time. He appears well-developed and well-nourished. No distress.  HENT:  Head: Normocephalic and atraumatic.  Right Ear: Tympanic membrane normal.  Left Ear: Tympanic membrane normal.  Ears:  Mouth/Throat: Oropharynx is clear and moist.  Eyes: Conjunctivae and EOM are normal. Pupils are equal, round, and reactive to light.  Neck: Normal range of motion. Neck supple.  Cardiovascular: Normal rate, regular rhythm and intact distal pulses.  No murmur heard. Pulmonary/Chest: Effort normal. No respiratory distress. He has wheezes in the right lower field. He has no rales.  Abdominal: Soft. He exhibits no distension. There is no tenderness. There is no rebound and no guarding.  Musculoskeletal: Normal range of motion. He exhibits no edema or tenderness.  Neurological: He is alert and oriented to person, place, and time.  Skin: Skin is warm and  dry. No rash noted. No erythema.  Psychiatric: He has a normal mood and affect. His behavior is normal.  Nursing note and vitals reviewed.    ED Treatments / Results  Labs (all labs ordered are listed, but only abnormal results are displayed) Labs Reviewed - No data to display  EKG  EKG Interpretation None       Radiology Dg Chest 2 View  Result Date: 09/06/2017 CLINICAL DATA:  Cough. EXAM: CHEST  2 VIEW COMPARISON:  08/22/2016 FINDINGS: The heart size and mediastinal contours are within normal limits. Both lungs are clear. The visualized skeletal structures are unremarkable. IMPRESSION: No active cardiopulmonary disease. Electronically Signed   By: Kerby Moors M.D.   On: 09/06/2017 10:35    Procedures Procedures (including critical care time)  Medications Ordered in ED Medications  albuterol (PROVENTIL HFA;VENTOLIN HFA) 108 (90  Base) MCG/ACT inhaler 2 puff (2 puffs Inhalation Given 09/06/17 0956)     Initial Impression / Assessment and Plan / ED Course  I have reviewed the triage vital signs and the nursing notes.  Pertinent labs & imaging results that were available during my care of the patient were reviewed by me and considered in my medical decision making (see chart for details).     Pt with symptoms consistent with bronchitis.  Well appearing here.  No signs of breathing difficulty  No signs of pharyngitis, otitis or abnormal abdominal findings.  Patient was seen a few weeks ago and at that time had an abscess of his right ear canal however he is currently taking clindamycin and area appears to be getting better.  Patient also reports it is improving. CXR wnl and pt to return with any further problems.  It was given an inhaler.  We will also try a short course of prednisone and patient given cough suppressant.  Also discussed with the patient how prednisone will affect his blood sugar and that he will need to increase his dose of insulin accordingly.     Final  Clinical Impressions(s) / ED Diagnoses   Final diagnoses:  Bronchitis    ED Discharge Orders        Ordered    HYDROcodone-homatropine (HYCODAN) 5-1.5 MG/5ML syrup  Every 6 hours PRN     09/06/17 1043    predniSONE (DELTASONE) 20 MG tablet  Daily     09/06/17 1043       Blanchie Dessert, MD 09/06/17 1044

## 2018-01-22 ENCOUNTER — Emergency Department (HOSPITAL_BASED_OUTPATIENT_CLINIC_OR_DEPARTMENT_OTHER)
Admission: EM | Admit: 2018-01-22 | Discharge: 2018-01-23 | Disposition: A | Discharge status: Home / Self Care | Payer: No Typology Code available for payment source | Attending: Emergency Medicine | Admitting: Emergency Medicine

## 2018-01-22 ENCOUNTER — Other Ambulatory Visit: Payer: Self-pay

## 2018-01-22 ENCOUNTER — Encounter (HOSPITAL_BASED_OUTPATIENT_CLINIC_OR_DEPARTMENT_OTHER): Payer: Self-pay | Admitting: *Deleted

## 2018-01-22 DIAGNOSIS — S161XXA Strain of muscle, fascia and tendon at neck level, initial encounter: Secondary | ICD-10-CM | POA: Diagnosis not present

## 2018-01-22 DIAGNOSIS — Y999 Unspecified external cause status: Secondary | ICD-10-CM | POA: Diagnosis not present

## 2018-01-22 DIAGNOSIS — Z794 Long term (current) use of insulin: Secondary | ICD-10-CM | POA: Diagnosis not present

## 2018-01-22 DIAGNOSIS — F17210 Nicotine dependence, cigarettes, uncomplicated: Secondary | ICD-10-CM | POA: Diagnosis not present

## 2018-01-22 DIAGNOSIS — Y92410 Unspecified street and highway as the place of occurrence of the external cause: Secondary | ICD-10-CM | POA: Diagnosis not present

## 2018-01-22 DIAGNOSIS — I10 Essential (primary) hypertension: Secondary | ICD-10-CM | POA: Diagnosis not present

## 2018-01-22 DIAGNOSIS — I69351 Hemiplegia and hemiparesis following cerebral infarction affecting right dominant side: Secondary | ICD-10-CM | POA: Diagnosis not present

## 2018-01-22 DIAGNOSIS — E1140 Type 2 diabetes mellitus with diabetic neuropathy, unspecified: Secondary | ICD-10-CM | POA: Insufficient documentation

## 2018-01-22 DIAGNOSIS — Y939 Activity, unspecified: Secondary | ICD-10-CM | POA: Diagnosis not present

## 2018-01-22 DIAGNOSIS — Z79899 Other long term (current) drug therapy: Secondary | ICD-10-CM | POA: Diagnosis not present

## 2018-01-22 DIAGNOSIS — S0990XA Unspecified injury of head, initial encounter: Secondary | ICD-10-CM | POA: Diagnosis not present

## 2018-01-22 DIAGNOSIS — S199XXA Unspecified injury of neck, initial encounter: Secondary | ICD-10-CM | POA: Diagnosis present
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
## 2018-01-23 ENCOUNTER — Emergency Department (HOSPITAL_BASED_OUTPATIENT_CLINIC_OR_DEPARTMENT_OTHER): Payer: No Typology Code available for payment source

## 2018-01-23 DIAGNOSIS — S161XXA Strain of muscle, fascia and tendon at neck level, initial encounter: Secondary | ICD-10-CM | POA: Diagnosis not present
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
## 2018-01-23 NOTE — ED Notes (Signed)
PT discharged to home with family. NAD. 

## 2018-05-03 ENCOUNTER — Ambulatory Visit: Payer: Medicare HMO | Admitting: Family Medicine

## 2018-05-03 NOTE — Progress Notes (Deleted)
Richard [name] DOB: [birth year] Encounter date: 05/03/2018  This isa 62 y.o. male who presents to establish care. No chief complaint on file.   History of present illness:  ***   Past Medical History:  Diagnosis Date  . Arthritis   . Diabetes mellitus    diet controlled  . GERD (gastroesophageal reflux disease)   . Hypertension   . Migraines   . Neuropathy   . Ruptured lumbar disc   . Stroke [facility])    Past Surgical History:  Procedure Laterality Date  . KNEE SURGERY     bil   No Known Allergies No outpatient medications have been marked as taking for the 05/03/18 encounter (Appointment) with [name], MD.   Social History   Tobacco Use  . Smoking status: Current Every Day Smoker    Packs/day: 0.50    Types: Cigarettes  . Smokeless tobacco: Never Used  Substance Use Topics  . Alcohol use: No   Family History  Problem Relation Age of Onset  . Stroke Father   . Hypertension Father      Review of Systems  Objective:  There were no vitals taken for this visit.      BP Readings from Last 3 Encounters:  01/23/18 (!) 147/85  09/06/17 (!) 159/80  09/03/17 (!) 175/94   Wt Readings from Last 3 Encounters:  01/22/18 201 lb (91.2 kg)  09/06/17 190 lb (86.2 kg)  09/03/17 190 lb (86.2 kg)    Physical Exam  Assessment/Plan:  There are no diagnoses linked to this encounter.  No follow-ups on file.  [name], MD

## 2018-05-05 ENCOUNTER — Ambulatory Visit (INDEPENDENT_AMBULATORY_CARE_PROVIDER_SITE_OTHER): Payer: Medicare HMO | Admitting: Family Medicine

## 2018-05-05 ENCOUNTER — Encounter: Payer: Self-pay | Admitting: Family Medicine

## 2018-05-05 ENCOUNTER — Other Ambulatory Visit: Payer: Self-pay | Admitting: Family Medicine

## 2018-05-05 VITALS — BP 132/76 | HR 113 | Temp 98.6°F | Ht 70.5 in | Wt 201.1 lb

## 2018-05-05 DIAGNOSIS — Z8673 Personal history of transient ischemic attack (TIA), and cerebral infarction without residual deficits: Secondary | ICD-10-CM

## 2018-05-05 DIAGNOSIS — Z794 Long term (current) use of insulin: Secondary | ICD-10-CM

## 2018-05-05 DIAGNOSIS — E1142 Type 2 diabetes mellitus with diabetic polyneuropathy: Secondary | ICD-10-CM

## 2018-05-05 DIAGNOSIS — E785 Hyperlipidemia, unspecified: Secondary | ICD-10-CM | POA: Diagnosis not present

## 2018-05-05 DIAGNOSIS — I10 Essential (primary) hypertension: Secondary | ICD-10-CM | POA: Insufficient documentation

## 2018-05-05 DIAGNOSIS — E1169 Type 2 diabetes mellitus with other specified complication: Secondary | ICD-10-CM | POA: Diagnosis not present

## 2018-05-05 DIAGNOSIS — E119 Type 2 diabetes mellitus without complications: Secondary | ICD-10-CM | POA: Insufficient documentation

## 2018-05-05 DIAGNOSIS — E1140 Type 2 diabetes mellitus with diabetic neuropathy, unspecified: Secondary | ICD-10-CM | POA: Insufficient documentation

## 2018-05-05 DIAGNOSIS — G43909 Migraine, unspecified, not intractable, without status migrainosus: Secondary | ICD-10-CM | POA: Diagnosis not present

## 2018-05-05 DIAGNOSIS — L089 Local infection of the skin and subcutaneous tissue, unspecified: Secondary | ICD-10-CM

## 2018-05-05 DIAGNOSIS — M1990 Unspecified osteoarthritis, unspecified site: Secondary | ICD-10-CM

## 2018-05-05 DIAGNOSIS — K219 Gastro-esophageal reflux disease without esophagitis: Secondary | ICD-10-CM | POA: Insufficient documentation

## 2018-05-05 DIAGNOSIS — N529 Male erectile dysfunction, unspecified: Secondary | ICD-10-CM

## 2018-05-05 LAB — COMPREHENSIVE METABOLIC PANEL
ALK PHOS: 131 U/L — AB (ref 39–117)
ALT: 5 U/L (ref 0–53)
AST: 8 U/L (ref 0–37)
Albumin: 3.9 g/dL (ref 3.5–5.2)
BUN: 27 mg/dL — ABNORMAL HIGH (ref 6–23)
CHLORIDE: 102 meq/L (ref 96–112)
CO2: 25 mEq/L (ref 19–32)
Calcium: 9.5 mg/dL (ref 8.4–10.5)
Creatinine, Ser: 2.94 mg/dL — ABNORMAL HIGH (ref 0.40–1.50)
GFR: 28.18 mL/min — AB (ref >60.00)
Glucose, Bld: 298 mg/dL — ABNORMAL HIGH (ref 70–99)
POTASSIUM: 4.5 meq/L (ref 3.5–5.1)
Sodium: 137 mEq/L (ref 135–145)
TOTAL PROTEIN: 7 g/dL (ref 6.0–8.3)
Total Bilirubin: 0.5 mg/dL (ref 0.2–1.2)

## 2018-05-05 LAB — VITAMIN B12: Vitamin B-12: 333 pg/mL (ref 211–911)

## 2018-05-05 LAB — CBC WITH DIFFERENTIAL/PLATELET
BASOS PCT: 0.6 % (ref 0.0–3.0)
Basophils Absolute: 0 10*3/uL (ref 0.0–0.1)
EOS PCT: 1.4 % (ref 0.0–5.0)
Eosinophils Absolute: 0.1 10*3/uL (ref 0.0–0.7)
HCT: 42.4 % (ref 39.0–52.0)
HEMOGLOBIN: 14.3 g/dL (ref 13.0–17.0)
Lymphocytes Relative: 21.7 % (ref 12.0–46.0)
Lymphs Abs: 1.6 10*3/uL (ref 0.7–4.0)
MCHC: 33.8 g/dL (ref 30.0–36.0)
MCV: 88 fl (ref 78.0–100.0)
MONOS PCT: 7.7 % (ref 3.0–12.0)
Monocytes Absolute: 0.6 10*3/uL (ref 0.1–1.0)
Neutro Abs: 5.2 10*3/uL (ref 1.4–7.7)
Neutrophils Relative %: 68.6 % (ref 43.0–77.0)
Platelets: 244 10*3/uL (ref 150.0–400.0)
RBC: 4.82 Mil/uL (ref 4.22–5.81)
RDW: 13.9 % (ref 11.5–15.5)
WBC: 7.5 10*3/uL (ref 4.0–10.5)

## 2018-05-05 LAB — LIPID PANEL
CHOL/HDL RATIO: 3
Cholesterol: 108 mg/dL (ref 0–200)
HDL: 37.9 mg/dL — AB (ref >39.00)
NonHDL: 70.21
Triglycerides: 202 mg/dL — ABNORMAL HIGH (ref 0.0–149.0)
VLDL: 40.4 mg/dL — AB (ref 0.0–40.0)

## 2018-05-05 LAB — TSH: TSH: 0.72 u[IU]/mL (ref 0.35–4.50)

## 2018-05-05 LAB — HEMOGLOBIN A1C: HEMOGLOBIN A1C: 10.5 % — AB (ref 4.6–6.5)

## 2018-05-05 LAB — LDL CHOLESTEROL, DIRECT: Direct LDL: 51 mg/dL

## 2018-05-05 MED ORDER — TADALAFIL 20 MG PO TABS: 20.0000 mg | ORAL_TABLET | Freq: Every day | ORAL | 2 refills | Status: DC | PRN

## 2018-05-05 NOTE — Progress Notes (Signed)
Richard [name] DOB: [birth year] Encounter date: 05/05/2018  This isa 62 y.o. male who presents to establish care. Chief Complaint  Patient presents with  . Establish Care    bump on the back side the left ear that is sore, scar on the left foot and akle that is irritated    History of present illness:  Home nurse told him to get another doctor to take a little more thorough care of him. He has been seeing a NP at previous office.   NP he was seeing told him kidney function was decreased and if he dropped further he would need dialysis.   Bump behind left ear that is sore. Not sure what this is. Started a few days ago.   Had area on left lateral ankle that got scraped about a year ago; never has fully healed and is pretty sore. Hasn't been putting anything on this.   No longer getting seen at [place].   Thinks that last bloodwork was early in this year.   Diabetes: does check sugars at home. Fasting states around 145 and if he checks 1 hours after eating in the 200's. Does have low blood sugars as well. Happened last week. Doesn't usually happen even on monthly basis.   Does get blood pressure checked at home when he has someone coming to house. Usually has someone 4 days/week. Usually running in the 140-150's/80's. States that stroke was induced by elevated blood pressure.   CKD: has seen kidney specialist in past; last was at the [place].   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 Typically once a month.   GERD: feels like this is bad. Wakes up every morning with acid reflux. Doesn't feel like the protonix is that helpful for him. One he tried in past made him feel nauseous. Not sure which one. Spicy food makes this worse for him. Has had a scope in the past. Hasn't had colonoscopy.   Arthritis in hands, shoulders, and hands that bothers him on regular basis.   Past Medical History:  Diagnosis Date  . Arthritis   . Diabetes mellitus, type II, insulin dependent ([place])   . GERD  (gastroesophageal reflux disease)   . Hypertension   . Migraines   . Neuropathy in diabetes ([place])    bilat feet  . Ruptured lumbar disc   . Stroke [facility]) 2013   residual right sided weakness (arm>leg)   Past Surgical History:  Procedure Laterality Date  . KNEE ARTHROSCOPY Bilateral    No Known Allergies Current Meds  Medication Sig  . amLODipine (NORVASC) 10 MG tablet Take 10 mg by mouth daily.  . chlorthalidone (HYGROTON) 25 MG tablet Take 25 mg by mouth daily.  . Cholecalciferol (VITAMIN D3) 2000 units TABS Take 1 tablet by mouth every morning.  Marland Kitchen CLONIDINE HCL PO Take 0.2 mg by mouth 2 (two) times daily.   . clopidogrel (PLAVIX) 75 MG tablet Take 75 mg by mouth daily.  . Insulin Aspart Prot & Aspart (NOVOLOG MIX 70/30 FLEXPEN [place]) Inject 25 Units into the skin every morning.  . insulin aspart protamine- aspart (NOVOLOG MIX 70/30) (70-30) 100 UNIT/ML injection Inject 15 Units into the skin at bedtime.   Marland Kitchen losartan-hydrochlorothiazide (HYZAAR) 100-12.5 MG tablet Take 1 tablet by mouth daily.  . pantoprazole (PROTONIX) 20 MG tablet Take 1 tablet (20 mg total) by mouth daily.  . pravastatin (PRAVACHOL) 80 MG tablet Take 80 mg by mouth at bedtime.  . tamsulosin (FLOMAX) 0.4 MG CAPS capsule  Take 0.4 mg by mouth daily.   Social History   Tobacco Use  . Smoking status: Current Every Day Smoker    Packs/day: 0.50    Types: Cigarettes  . Smokeless tobacco: Never Used  Substance Use Topics  . Alcohol use: No   Family History  Problem Relation Age of Onset  . Other Mother   . Stroke Father   . Hypertension Father   . Epilepsy Father   . Breast cancer Sister   . Stroke Brother   . Heart attack Brother   . Diabetes Sister      Review of Systems  Constitutional: Negative for chills, fatigue and fever.  Respiratory: Negative for cough, chest tightness, shortness of breath and wheezing.   Cardiovascular: Negative for chest pain, palpitations and leg swelling.  Genitourinary:        Difficulty getting and maintaining erections   Skin: Positive for wound. Negative for color change.       Toenails difficult to take care of  Neurological: Positive for weakness, numbness and headaches (see hpi).  Psychiatric/Behavioral: Positive for agitation (does feel that he gets easily agitated). Negative for suicidal ideas. The patient is not nervous/anxious.     Objective:  BP 132/76   Pulse (!) 113   Temp 98.6 F (37 C) (Oral)   Ht 5' 10.5" (1.791 m)   Wt 201 lb 1.6 oz (91.2 kg)   SpO2 97%   BMI 28.45 kg/m   Weight: 201 lb 1.6 oz (91.2 kg)   BP Readings from Last 3 Encounters:  05/05/18 132/76  01/23/18 (!) 147/85  09/06/17 (!) 159/80   Wt Readings from Last 3 Encounters:  05/05/18 201 lb 1.6 oz (91.2 kg)  01/22/18 201 lb (91.2 kg)  09/06/17 190 lb (86.2 kg)    Physical Exam  Constitutional: He is oriented to person, place, and time. He appears well-developed and well-nourished. No distress.  Cardiovascular: Normal rate, regular rhythm and normal heart sounds. Exam reveals no friction rub.  No murmur heard. Pulmonary/Chest: Effort normal and breath sounds normal. No respiratory distress. He has no wheezes. He has no rales.  Musculoskeletal:       Right shoulder: He exhibits decreased range of motion.       Right elbow: He exhibits decreased range of motion.       Right wrist: He exhibits decreased range of motion and deformity.  Increased tone with some spasticity with movement right arm.   Neurological: He is alert and oriented to person, place, and time. He exhibits abnormal muscle tone. Gait abnormal.  Sensory exam of the foot is normal, tested with the monofilament. Good pulses, no ulcers, good peripheral pulses. See skin documentation.  Use of right arm limited secondary to previous stroke. Increased tone in right arm; limited abduction significant; limited use of hand significnat.      Skin: Skin is warm and dry.  Pustule posterior left ear actively  draining. 0.25cm; no surrounding erythema. There is some tenderness with palpation.   Left foot - lateral foot near 5th metatarsal there is hyperpigmented callous. This is tender to touch although no surrounding tenderness is appreciated or underlying bony tenderness. No warmth  Psychiatric: He has a normal mood and affect.    Assessment/Plan:  1. Diabetic polyneuropathy associated with type 2 diabetes mellitus ([place]) He is interested in podiatry for regular nail care. I suspect they can help with callous he has on foot as well that is causing pain.  - TSH; Future -  Vitamin B12; Future - Ambulatory referral to Podiatry - Vitamin B12 - TSH  2. Migraine without status migrainosus, not intractable, unspecified migraine type Headaches come only when agitated.  We will continue to visit the issue of agitation and anger as this seems to affect his blood pressure and headaches.  3. Essential hypertension Blood pressure for him has been somewhat stable.  I would like slightly tighter control than he had in the office today, but we will see how lab work looks and how blood pressure looks on recheck in the office.  I will not make adjustments today since blood pressure is within reasonable limits. - CBC with Differential/Platelet; Future - Comprehensive metabolic panel; Future - TSH; Future - TSH - Comprehensive metabolic panel - CBC with Differential/Platelet  4. Gastroesophageal reflux disease, esophagitis presence not specified Continue current medication  5. Diabetes mellitus, type II, insulin dependent (HCC) Uncertain control.  Will check baseline blood work to have him return for office visit to review.  May need to reconsider treatment due to occasional low blood sugars.  Of note he is also eating only one time daily but has done this for years.  May make dosing insulin more difficult.  - Hemoglobin A1c; Future - HM DIABETES FOOT EXAM - Microalbumin, urine; Future - Ambulatory  referral to Podiatry - Hemoglobin A1c  6. Arthritis Stable.  Continue to monitor.  7. Hyperlipidemia associated with type 2 diabetes mellitus ([place])  - Lipid panel; Future - Lipid panel  8. Erectile dysfunction, unspecified erectile dysfunction type  - tadalafil (CIALIS) 20 MG tablet; Take 1 tablet (20 mg total) by mouth daily as needed for erectile dysfunction.  Dispense: 10 tablet; Refill: 2  9. History of stroke He would like to work on strength in the right side and better use of his right arm.  He did minimal physical therapy after his stroke. - Ambulatory referral to Physical Therapy  10. Pustule behind left ear: already draining. Warm compresses TID; if worsening let me know or if not resolved in a week.  Return in about 1 month (around 06/05/2018). Due to having multiple medical issues, needing previous records, and needing updated bloodwork, we will have him return in a month for recheck of all of the above and to review labwork together. States he did cologuard last year. We will try to get this result.   [name], MD

## 2018-05-06 ENCOUNTER — Telehealth: Payer: Self-pay | Admitting: Family Medicine

## 2018-05-06 NOTE — Telephone Encounter (Signed)
Copied from [place] [id number]. Topic: General - Other >> May 06, 2018  1:33 PM Adelene Idler wrote: [facility] is requesting insurance infor for pt to be faxed over  Fax - [phone]

## 2018-05-07 ENCOUNTER — Other Ambulatory Visit: Payer: Self-pay | Admitting: Family Medicine

## 2018-05-07 DIAGNOSIS — E785 Hyperlipidemia, unspecified: Principal | ICD-10-CM

## 2018-05-07 DIAGNOSIS — E1169 Type 2 diabetes mellitus with other specified complication: Secondary | ICD-10-CM

## 2018-05-07 NOTE — Telephone Encounter (Signed)
I refaxed TO [facility])  Physical therapy clinic in [place], [place]  Address: [address], [place], [place] [address]   [phone] Their office will contact pt tp schedule directly d the insurance information was included in the fax refaxed again

## 2018-05-17 ENCOUNTER — Telehealth: Payer: Self-pay | Admitting: *Deleted

## 2018-05-17 NOTE — Telephone Encounter (Signed)
Prior auth for Tadalafil 20mg  sent to [email or web address].

## 2018-05-21 NOTE — Telephone Encounter (Signed)
Fax received from [facility] stating the request was denied and this was given to Dr Berenice Bouton asst.

## 2018-05-21 NOTE — Telephone Encounter (Signed)
Patient has been notified

## 2018-06-03 ENCOUNTER — Encounter: Payer: Self-pay | Admitting: Family Medicine

## 2018-06-04 ENCOUNTER — Ambulatory Visit: Payer: Medicare HMO | Admitting: Family Medicine

## 2018-06-10 NOTE — Progress Notes (Signed)
Richard [name] DOB: 05/30/1956 Encounter date: 06/11/2018  This is a 62 y.o. male who presents with Chief Complaint  Patient presents with  . Follow-up    Bg 135 fasting avg, after eating 255 avg    History of present illness: Has some frustration that therapy is what he does every day; he is ready to be patient and keep trying however. Has been going for 3 weeks. Going 1-2 times/week.   HTN: Home readings? Similar blood pressures readings at home. He has run out of amlodipine so has been off medication now for a week. States that he called pharmacy and they told him they would get in touch with [place].  Endocrinology appt? Not scheduled yet. I did check into referral and he needs to call to schedule.  Podiatry appt? 9/27  Nephrology appt? Appointment Oct 1st  Was having issues with urine stream prior to flomax; has done much better since starting with this.    DM: Still doing usually one meal/day. Sugars fasting range 135 (morning) to near 300 (preprandial in afternoon/evening. Not had sugars over 300. Has had episodes of low sugar but this is rare and has not happened in last month.   No Known Allergies Current Meds  Medication Sig  . amLODipine (NORVASC) 10 MG tablet Take 10 mg by mouth daily.  . chlorthalidone (HYGROTON) 25 MG tablet Take 25 mg by mouth daily.  . Cholecalciferol (VITAMIN D3) 2000 units TABS Take 1 tablet by mouth every morning.  Marland Kitchen CLONIDINE HCL PO Take 0.2 mg by mouth 2 (two) times daily.   . clopidogrel (PLAVIX) 75 MG tablet Take 75 mg by mouth daily.  . Insulin Aspart Prot & Aspart (NOVOLOG MIX 70/30 FLEXPEN [place]) Inject 25 Units into the skin every morning.  . insulin aspart protamine- aspart (NOVOLOG MIX 70/30) (70-30) 100 UNIT/ML injection Inject 15 Units into the skin at bedtime.   Marland Kitchen losartan-hydrochlorothiazide (HYZAAR) 100-12.5 MG tablet Take 1 tablet by mouth daily.  . pantoprazole (PROTONIX) 20 MG tablet Take 1 tablet (20 mg total) by mouth daily.   . pravastatin (PRAVACHOL) 80 MG tablet Take 80 mg by mouth at bedtime.  . sildenafil (REVATIO) 20 MG tablet Take 1-3 tablets by mouth as needed  . tadalafil (CIALIS) 20 MG tablet Take 1 tablet (20 mg total) by mouth daily as needed for erectile dysfunction.  . tamsulosin (FLOMAX) 0.4 MG CAPS capsule Take 0.4 mg by mouth daily.    Review of Systems  Constitutional: Negative for chills, fatigue and fever.  Respiratory: Negative for cough, chest tightness, shortness of breath and wheezing.   Cardiovascular: Negative for chest pain, palpitations and leg swelling.  Genitourinary: Negative for difficulty urinating.       Viagra works well for him for ED; requests refill  Neurological: Positive for weakness.  Psychiatric/Behavioral:       Gets agitated easily. Mood is good when he is not provoked by others.    Objective:  BP (!) 150/80 (BP Location: Left Arm, Patient Position: Sitting, Cuff Size: Normal)   Pulse 89   Temp 98.9 F (37.2 C) (Oral)   Wt 201 lb 4.8 oz (91.3 kg)   SpO2 98%   BMI 28.48 kg/m   Weight: 201 lb 4.8 oz (91.3 kg)   BP Readings from Last 3 Encounters:  06/11/18 (!) 150/80  05/05/18 132/76  01/23/18 (!) 147/85   Wt Readings from Last 3 Encounters:  06/11/18 201 lb 4.8 oz (91.3 kg)  05/05/18 201 lb 1.6  oz (91.2 kg)  01/22/18 201 lb (91.2 kg)    Physical Exam  Constitutional: He is oriented to person, place, and time. He appears well-developed and well-nourished. No distress.  Cardiovascular: Normal rate, regular rhythm and normal heart sounds. Exam reveals no friction rub.  No murmur heard. No lower extremity edema  Pulmonary/Chest: Effort normal and breath sounds normal. No respiratory distress. He has no wheezes. He has no rales.  Neurological: He is alert and oriented to person, place, and time.  Psychiatric: He has a normal mood and affect. His behavior is normal.    Assessment/Plan 1. Essential hypertension Ran out of amlodipine which explains  elevation. Will restart this medication.  I have asked him to hold his losartan-hydrochlorothiazide due to strain on kidneys since he is nearly out of medication and we are able to add back in amlodipine.  We discussed that losartan could be added in in the future, but for now we will hold it until he sees nephrology.  Continue to monitor blood pressures at home. - amLODipine (NORVASC) 10 MG tablet; Take 1 tablet (10 mg total) by mouth daily.  Dispense: 90 tablet; Refill: 1 - chlorthalidone (HYGROTON) 25 MG tablet; Take 1 tablet (25 mg total) by mouth daily.  Dispense: 90 tablet; Refill: 1 - cloNIDine (CATAPRES) 0.2 MG tablet; Take 1 tablet (0.2 mg total) by mouth 2 (two) times daily.  Dispense: 180 tablet; Refill: 1  2. Gastroesophageal reflux disease, esophagitis presence not specified Stable with medication. - pantoprazole (PROTONIX) 20 MG tablet; Take 1 tablet (20 mg total) by mouth daily.  Dispense: 90 tablet; Refill: 1  3. Stage 3 chronic renal impairment associated with type 2 diabetes mellitus ([place]) Has upcoming appointment with nephrology.  4. Diabetic polyneuropathy associated with type 2 diabetes mellitus ([place]) Number given to schedule endocrinology appointment.  Increase morning insulin to 28 units.  Continue to monitor blood sugars at home. - insulin aspart protamine - aspart (NOVOLOG MIX 70/30 FLEXPEN) (70-30) 100 UNIT/ML FlexPen; Inject 0.28 mLs (28 Units total) into the skin every morning.  Dispense: 15 mL; Refill: 11 - insulin aspart protamine- aspart (NOVOLOG MIX 70/30) (70-30) 100 UNIT/ML injection; Inject 0.15 mLs (15 Units total) into the skin at bedtime.  Dispense: 10 mL; Refill: 11  5. Tobacco abuse He is not interested in quitting at this time.  6. Prostate hypertrophy Urine flow is much better since starting Flomax.  Continue this medication. - tamsulosin (FLOMAX) 0.4 MG CAPS capsule; Take 1 capsule (0.4 mg total) by mouth daily.  Dispense: 90 capsule; Refill: 1  7.  Hyperlipidemia, unspecified hyperlipidemia type - pravastatin (PRAVACHOL) 80 MG tablet; Take 1 tablet (80 mg total) by mouth at bedtime.  Dispense: 90 tablet; Refill: 1     Hold losartan-hctz until you see nephrology. Continue to check home blood pressures now that you are restarting amlodipine.    [name], MD

## 2018-06-11 ENCOUNTER — Encounter: Payer: Self-pay | Admitting: Family Medicine

## 2018-06-11 ENCOUNTER — Ambulatory Visit (INDEPENDENT_AMBULATORY_CARE_PROVIDER_SITE_OTHER): Payer: Medicare HMO | Admitting: Family Medicine

## 2018-06-11 VITALS — BP 150/80 | HR 89 | Temp 98.9°F | Wt 201.3 lb

## 2018-06-11 DIAGNOSIS — I10 Essential (primary) hypertension: Secondary | ICD-10-CM

## 2018-06-11 DIAGNOSIS — N183 Chronic kidney disease, stage 3 (moderate): Secondary | ICD-10-CM

## 2018-06-11 DIAGNOSIS — E785 Hyperlipidemia, unspecified: Secondary | ICD-10-CM

## 2018-06-11 DIAGNOSIS — E1142 Type 2 diabetes mellitus with diabetic polyneuropathy: Secondary | ICD-10-CM | POA: Diagnosis not present

## 2018-06-11 DIAGNOSIS — N400 Enlarged prostate without lower urinary tract symptoms: Secondary | ICD-10-CM

## 2018-06-11 DIAGNOSIS — K219 Gastro-esophageal reflux disease without esophagitis: Secondary | ICD-10-CM | POA: Diagnosis not present

## 2018-06-11 DIAGNOSIS — E1122 Type 2 diabetes mellitus with diabetic chronic kidney disease: Secondary | ICD-10-CM | POA: Diagnosis not present

## 2018-06-11 DIAGNOSIS — Z720 Tobacco use: Secondary | ICD-10-CM

## 2018-06-11 MED ORDER — AMLODIPINE BESYLATE 10 MG PO TABS: 10.0000 mg | ORAL_TABLET | Freq: Every day | ORAL | 1 refills | Status: DC

## 2018-06-11 MED ORDER — CHLORTHALIDONE 25 MG PO TABS: 25.0000 mg | ORAL_TABLET | Freq: Every day | ORAL | 1 refills | Status: DC

## 2018-06-11 MED ORDER — TAMSULOSIN HCL 0.4 MG PO CAPS: 0.4000 mg | ORAL_CAPSULE | Freq: Every day | ORAL | 1 refills | Status: DC

## 2018-06-11 MED ORDER — PRAVASTATIN SODIUM 80 MG PO TABS: 80.0000 mg | ORAL_TABLET | Freq: Every day | ORAL | 1 refills | Status: DC

## 2018-06-11 MED ORDER — INSULIN ASPART PROT & ASPART (70-30 MIX) 100 UNIT/ML ~~LOC~~ SUSP: 15.0000 [IU] | Freq: Every day | SUBCUTANEOUS | 11 refills | Status: DC

## 2018-06-11 MED ORDER — INSULIN ASPART PROT & ASPART (70-30 MIX) 100 UNIT/ML PEN
28.0000 [IU] | PEN_INJECTOR | Freq: Every morning | SUBCUTANEOUS | 11 refills | Status: DC
Start: 2018-06-11 — End: 2019-10-05

## 2018-06-11 MED ORDER — PANTOPRAZOLE SODIUM 20 MG PO TBEC: 20.0000 mg | DELAYED_RELEASE_TABLET | Freq: Every day | ORAL | 1 refills | Status: DC

## 2018-06-11 MED ORDER — SILDENAFIL CITRATE 20 MG PO TABS: ORAL_TABLET | ORAL | 2 refills | Status: DC

## 2018-06-11 MED ORDER — BLOOD GLUCOSE MONITOR KIT: PACK | 0 refills | Status: DC

## 2018-06-11 MED ORDER — CLONIDINE HCL 0.2 MG PO TABS
0.2000 mg | ORAL_TABLET | Freq: Two times a day (BID) | ORAL | 1 refills | Status: DC
Start: 2018-06-11 — End: 2018-09-23

## 2018-06-11 NOTE — Patient Instructions (Signed)
Call [phone] to schedule visit with endocrinologist for diabetes control.   Increase morning novolog to 28 units. Continue to monitor blood sugars.

## 2018-06-18 ENCOUNTER — Encounter: Payer: Self-pay | Admitting: Podiatry

## 2018-06-18 ENCOUNTER — Ambulatory Visit (INDEPENDENT_AMBULATORY_CARE_PROVIDER_SITE_OTHER): Payer: Medicare HMO | Admitting: Podiatry

## 2018-06-18 VITALS — BP 208/103 | HR 82

## 2018-06-18 DIAGNOSIS — M79674 Pain in right toe(s): Secondary | ICD-10-CM | POA: Diagnosis not present

## 2018-06-18 DIAGNOSIS — E119 Type 2 diabetes mellitus without complications: Secondary | ICD-10-CM

## 2018-06-18 DIAGNOSIS — D689 Coagulation defect, unspecified: Secondary | ICD-10-CM

## 2018-06-18 DIAGNOSIS — M79675 Pain in left toe(s): Secondary | ICD-10-CM

## 2018-06-18 DIAGNOSIS — G5792 Unspecified mononeuropathy of left lower limb: Secondary | ICD-10-CM | POA: Diagnosis not present

## 2018-06-18 DIAGNOSIS — B351 Tinea unguium: Secondary | ICD-10-CM

## 2018-06-18 NOTE — Progress Notes (Signed)
This patient presents to the office with chief complaint of long thick nails and diabetic feet.  This patient  says there  is  Pain on the top of his left foot at site of black skin lesion. He says this developed when he injured his foot one year ago.  This patient says there are long thick painful nails.  These nails are painful walking and wearing shoes.  Patient has no history of infection or drainage from both feet.  Patient is unable to  self treat his own nails . This patient presents  to the office today for treatment of the  long nails and a foot evaluation due to history of  Diabetes.  His blood pressure is high and patient says he did not take his medicine today.  Patient is taking plavix.    General Appearance  Alert, conversant and in no acute stress.  Vascular  Dorsalis pedis and posterior tibial  pulses are palpable  bilaterally.  Capillary return is within normal limits  bilaterally. Temperature is within normal limits  bilaterally.  Neurologic  Senn-Weinstein monofilament wire test within normal limits  bilaterally. Muscle power within normal limits bilaterally.  Nails Thick disfigured discolored nails with subungual debris  from hallux to fifth toes bilaterally. No evidence of bacterial infection or drainage bilaterally.  Orthopedic  No limitations of motion of motion feet .  No crepitus or effusions noted.  No bony pathology or digital deformities noted.  Skin  normotropic skin with no porokeratosis noted bilaterally.  No signs of infections or ulcers noted.  Blackened skin lesion over fifth metabase left foot.    Onychomycosis  Diabetes with no foot complications  IE  Debride nails x 10.  A diabetic foot exam was performed and there is no evidence of any vascular or neurologic pathology.   RTC 3 months.  Upon examination of the blackened skin lesion left foot there appears to be pain out of proportion to what is seen.  I therefore suspect he has been injuring his sural nerve due to  the skin lesion.  Debridement of the skin lesion was performed and he says that there was less pressure on his left foot  as he was leaving the office.  RTC 3 months for nail care.  RTC 1 year for annual diabetic foot exam.   Gardiner Barefoot DPM

## 2018-06-25 ENCOUNTER — Other Ambulatory Visit: Payer: Self-pay | Admitting: Nephrology

## 2018-06-25 DIAGNOSIS — N184 Chronic kidney disease, stage 4 (severe): Secondary | ICD-10-CM

## 2018-06-25 DIAGNOSIS — I129 Hypertensive chronic kidney disease with stage 1 through stage 4 chronic kidney disease, or unspecified chronic kidney disease: Secondary | ICD-10-CM

## 2018-06-29 ENCOUNTER — Ambulatory Visit
Admission: RE | Admit: 2018-06-29 | Discharge: 2018-06-29 | Disposition: A | Discharge status: Home / Self Care | Payer: Medicare HMO | Source: Ambulatory Visit | Attending: Nephrology | Admitting: Nephrology

## 2018-06-29 DIAGNOSIS — N184 Chronic kidney disease, stage 4 (severe): Secondary | ICD-10-CM

## 2018-06-29 DIAGNOSIS — I129 Hypertensive chronic kidney disease with stage 1 through stage 4 chronic kidney disease, or unspecified chronic kidney disease: Secondary | ICD-10-CM

## 2018-07-01 ENCOUNTER — Other Ambulatory Visit: Payer: Self-pay | Admitting: Family Medicine

## 2018-07-01 ENCOUNTER — Telehealth: Payer: Self-pay | Admitting: Dietician

## 2018-07-01 ENCOUNTER — Encounter: Payer: Self-pay | Admitting: Endocrinology

## 2018-07-01 ENCOUNTER — Ambulatory Visit (INDEPENDENT_AMBULATORY_CARE_PROVIDER_SITE_OTHER): Payer: Medicare HMO | Admitting: Endocrinology

## 2018-07-01 VITALS — BP 122/80 | HR 96 | Ht 70.5 in | Wt 200.6 lb

## 2018-07-01 DIAGNOSIS — E119 Type 2 diabetes mellitus without complications: Secondary | ICD-10-CM

## 2018-07-01 DIAGNOSIS — N400 Enlarged prostate without lower urinary tract symptoms: Secondary | ICD-10-CM

## 2018-07-01 DIAGNOSIS — Z794 Long term (current) use of insulin: Secondary | ICD-10-CM | POA: Diagnosis not present

## 2018-07-01 DIAGNOSIS — I10 Essential (primary) hypertension: Secondary | ICD-10-CM

## 2018-07-01 LAB — POCT GLYCOSYLATED HEMOGLOBIN (HGB A1C): Hemoglobin A1C: 8.5 % — AB (ref 4.0–5.6)

## 2018-07-01 MED ORDER — FREESTYLE LIBRE 14 DAY SENSOR MISC: 1.0000 | 3 refills | Status: DC

## 2018-07-01 MED ORDER — FREESTYLE LIBRE 14 DAY READER DEVI: 1.0000 | Freq: Once | 0 refills | Status: AC

## 2018-07-01 NOTE — Patient Instructions (Addendum)
good diet and exercise significantly improve the control of your diabetes.  please let me know if you wish to be referred to a dietician.  high blood sugar is very risky to your health.  you should see an eye doctor and dentist every year.  It is very important to get all recommended vaccinations.  Controlling your blood pressure and cholesterol drastically reduces the damage diabetes does to your body.  Those who smoke should quit.  Please discuss these with your doctor.   I have sent a prescription to your pharmacy, for the continuous glucose monitor Please see Richard [name], if the insurance does not pay, or if you need her help getting started with it.   Please come back for a follow-up appointment in 2-3 weeks.

## 2018-07-01 NOTE — Progress Notes (Signed)
Subjective:    Patient ID: Richard [name], male    DOB: 1955/11/11, 62 y.o.   MRN: [id number]  HPI pt is referred by Dr Ethlyn Gallery, for diabetes.  Pt states DM was dx'ed in 1990; he has moderate neuropathy of the lower extremities; he has associated renal failure, DR, and CVA; he has been on insulin since early 2019; pt says his diet is poor, and exercise is limited by CVA; he has never had pancreatitis, pancreatic surgery, severe hypoglycemia or DKA (but he had nonketotic hyperosmolar hyperglycemic state in 2016).  He takes novolog 70/30, 28 units, and 15 units with the evening meal.  He does not check cbg's, but he sometimes feels weak and hungry.  Pt says he has trouble checking cbg, due to weakness of the right hand.   Past Medical History:  Diagnosis Date  . Arthritis   . Diabetes mellitus, type II, insulin dependent ([place])   . GERD (gastroesophageal reflux disease)   . Hypertension   . Migraines   . Neuropathy in diabetes ([place])    bilat feet  . Ruptured lumbar disc   . Stroke [facility]) 2013   residual right sided weakness (arm>leg)    Past Surgical History:  Procedure Laterality Date  . KNEE ARTHROSCOPY Bilateral     Social History   Socioeconomic History  . Marital status: Single    Spouse name: Not on file  . Number of children: Not on file  . Years of education: Not on file  . Highest education level: Not on file  Occupational History  . Not on file  Social Needs  . Financial resource strain: Not on file  . Food insecurity:    Worry: Not on file    Inability: Not on file  . Transportation needs:    Medical: Not on file    Non-medical: Not on file  Tobacco Use  . Smoking status: Current Every Day Smoker    Packs/day: 0.50    Types: Cigarettes  . Smokeless tobacco: Never Used  Substance and Sexual Activity  . Alcohol use: No  . Drug use: Yes    Types: Marijuana  . Sexual activity: Not on file  Lifestyle  . Physical activity:    Days per week: Not on  file    Minutes per session: Not on file  . Stress: Not on file  Relationships  . Social connections:    Talks on phone: Not on file    Gets together: Not on file    Attends religious service: Not on file    Active member of club or organization: Not on file    Attends meetings of clubs or organizations: Not on file    Relationship status: Not on file  . Intimate partner violence:    Fear of current or ex partner: Not on file    Emotionally abused: Not on file    Physically abused: Not on file    Forced sexual activity: Not on file  Other Topics Concern  . Not on file  Social History Narrative  . Not on file    Current Outpatient Medications on File Prior to Visit  Medication Sig Dispense Refill  . ACCU-CHEK AVIVA PLUS test strip Use 1 test strip to test blood sugar 2-3 times daily  1  . amLODipine (NORVASC) 10 MG tablet Take 1 tablet (10 mg total) by mouth daily. 90 tablet 1  . blood glucose meter kit and supplies KIT Dispense based on patient and insurance  preference. Check blood sugar 4 times a day 1 each 0  . chlorhexidine (PERIDEX) 0.12 % solution Swish with 66ms for 30 seconds then spit may use 2 times daily  2  . chlorthalidone (HYGROTON) 25 MG tablet Take 1 tablet (25 mg total) by mouth daily. 90 tablet 1  . clindamycin (CLEOCIN) 300 MG capsule TAKE 1 CAPSULE BY MOUTH 2 TIMES DAILY FOR 7 DAYS  0  . cloNIDine (CATAPRES) 0.2 MG tablet Take 1 tablet (0.2 mg total) by mouth 2 (two) times daily. 180 tablet 1  . clopidogrel (PLAVIX) 75 MG tablet Take 75 mg by mouth daily.    . insulin aspart protamine - aspart (NOVOLOG MIX 70/30 FLEXPEN) (70-30) 100 UNIT/ML FlexPen Inject 0.28 mLs (28 Units total) into the skin every morning. 15 mL 11  . insulin aspart protamine- aspart (NOVOLOG MIX 70/30) (70-30) 100 UNIT/ML injection Inject 0.15 mLs (15 Units total) into the skin at bedtime. 10 mL 11  . NARCAN 4 MG/0.1ML LIQD nasal spray kit     . Oxycodone HCl 20 MG TABS     . pantoprazole  (PROTONIX) 20 MG tablet Take 1 tablet (20 mg total) by mouth daily. 90 tablet 1  . pantoprazole (PROTONIX) 40 MG tablet Take 40 mg by mouth daily.  2  . pravastatin (PRAVACHOL) 80 MG tablet Take 1 tablet (80 mg total) by mouth at bedtime. 90 tablet 1  . sildenafil (REVATIO) 20 MG tablet Take 1-3 tablets by mouth as needed 30 tablet 2  . tamsulosin (FLOMAX) 0.4 MG CAPS capsule Take 1 capsule (0.4 mg total) by mouth daily. 90 capsule 1   No current facility-administered medications on file prior to visit.     No Known Allergies  Family History  Problem Relation Age of Onset  . Other Mother   . Stroke Father   . Hypertension Father   . Epilepsy Father   . Breast cancer Sister   . Stroke Brother   . Heart attack Brother   . Diabetes Sister     BP 122/80 (BP Location: Left Arm)   Pulse 96   Ht 5' 10.5" (1.791 m)   Wt 200 lb 9.6 oz (91 kg)   SpO2 98%   BMI 28.38 kg/m     Review of Systems denies weight loss, blurry vision, headache, chest pain, sob, n/v, urinary frequency, muscle cramps, excessive diaphoresis, memory loss, depression, cold intolerance, rhinorrhea, and easy bruising.      Objective:   Physical Exam VS: see vs page GEN: no distress HEAD: head: no deformity eyes: no periorbital swelling, no proptosis external nose and ears are normal mouth: no lesion seen NECK: supple, thyroid is not enlarged CHEST WALL: no deformity LUNGS: clear to auscultation CV: reg rate and rhythm, no murmur ABD: abdomen is soft, nontender.  no hepatosplenomegaly.  not distended.  no hernia MUSCULOSKELETAL: muscle bulk and strength are grossly normal on the LUE and RUE, but decreased on the RUE and RLE.  no obvious joint swelling.  gait is steady, with a cane.   EXTEMITIES: no deformity.  no ulcer on the feet.  feet are of normal color and temp.  1+ bilat leg edema.  There is bilateral onychomycosis of the toenails.    PULSES: dorsalis pedis intact bilat.  no carotid bruit NEURO:  cn  2-12 grossly intact.   readily moves all 4's.  sensation is intact to touch on the feet.   SKIN:  Normal texture and temperature.  No rash or  suspicious lesion is visible.   NODES:  None palpable at the neck.  PSYCH: alert, well-oriented.  Does not appear anxious nor depressed.    Lab Results  Component Value Date   HGBA1C 8.5 (A) 07/01/2018   I have reviewed outside records, and summarized: Pt was noted to have elevated a1c, and referred here.  renal insuff, GERD, and HTN were also addressed.      Assessment & Plan:  Type 2 DM, with renal failure: he needs increased rx.  CVA: if he cannot check cbg, then the next step would be to see if he can operate a continuous glucose monitor.   Patient Instructions  good diet and exercise significantly improve the control of your diabetes.  please let me know if you wish to be referred to a dietician.  high blood sugar is very risky to your health.  you should see an eye doctor and dentist every year.  It is very important to get all recommended vaccinations.  Controlling your blood pressure and cholesterol drastically reduces the damage diabetes does to your body.  Those who smoke should quit.  Please discuss these with your doctor.   I have sent a prescription to your pharmacy, for the continuous glucose monitor Please see Mickel Baas, if the insurance does not pay, or if you need her help getting started with it.   Please come back for a follow-up appointment in 2-3 weeks.

## 2018-07-01 NOTE — Telephone Encounter (Signed)
Brief Nutrition Note Saw patient briefly to determine his ability to complete blood sugar testing.  History includes a stroke.  He is unable to use his right arm and right hand.  He is right handed.   Had patient try different lansing devises.  Patient cannot get the cap off the lansing devise to put the needle in it.    Recommended the [employer]. Insurance is [employer] and [employer]. He is on 70/30 insulin bid.  Will see him back in my office if he is unable to obtain the prescription and to help him put this on.  Antonieta Iba, RD, LDN, CDE

## 2018-07-02 ENCOUNTER — Other Ambulatory Visit: Payer: Self-pay

## 2018-07-02 MED ORDER — VITAMIN D3 50 MCG (2000 UT) PO TABS: 1.0000 | ORAL_TABLET | Freq: Every morning | ORAL | 1 refills | Status: DC

## 2018-07-02 NOTE — Telephone Encounter (Signed)
Historical Rx, ok to fill?

## 2018-07-06 ENCOUNTER — Other Ambulatory Visit: Payer: Self-pay

## 2018-07-06 NOTE — Telephone Encounter (Signed)
Last fill 06/01/18 Qty: 30   Historical med. Ok to fill?

## 2018-07-07 MED ORDER — PANTOPRAZOLE SODIUM 40 MG PO TBEC: 40.0000 mg | DELAYED_RELEASE_TABLET | Freq: Every day | ORAL | 2 refills | Status: DC

## 2018-07-08 ENCOUNTER — Telehealth: Payer: Self-pay | Admitting: Dietician

## 2018-07-08 NOTE — Telephone Encounter (Signed)
Called patient to follow up about the [employer].  His pharmacy called to state that they could not get this to go through on his insurance.   Discussed that Medicare generally does not cover the [place] but will see if this can be done with additional paperwork.  Will work to complete this.  Antonieta Iba, RD, LDN, CDE

## 2018-07-15 ENCOUNTER — Encounter: Payer: Self-pay | Admitting: Endocrinology

## 2018-07-15 ENCOUNTER — Ambulatory Visit (INDEPENDENT_AMBULATORY_CARE_PROVIDER_SITE_OTHER): Payer: Medicare HMO | Admitting: Endocrinology

## 2018-07-15 VITALS — BP 162/88 | HR 90 | Ht 72.0 in | Wt 207.0 lb

## 2018-07-15 DIAGNOSIS — Z794 Long term (current) use of insulin: Secondary | ICD-10-CM

## 2018-07-15 DIAGNOSIS — E119 Type 2 diabetes mellitus without complications: Secondary | ICD-10-CM | POA: Diagnosis not present

## 2018-07-15 LAB — POCT GLYCOSYLATED HEMOGLOBIN (HGB A1C): Hemoglobin A1C: 8 % — AB (ref 4.0–5.6)

## 2018-07-15 NOTE — Progress Notes (Signed)
Subjective:    Patient ID: Richard [name], male    DOB: 06-18-1956, 62 y.o.   MRN: [id number]  HPI Pt returns for f/u of diabetes mellitus: DM type: Insulin-requiring type 2.  Dx'ed: 9924 Complications: polyneuropathy, renal failure, DR, and CVA Therapy: insulin since early 2019 DKA: never (but he had nonketotic hyperosmolar hyperglycemic state in 2016) Severe hypoglycemia: never Pancreatitis: never Pancreatic imaging: normal on 2018 CT Other: he has trouble checking cbg, due to weakness of the right hand, but he can operate [employer] continuous glucose monitor Interval history: He takes 28 units with breakfast, and 5 units with supper.  He takes as rx'ed.  he reports sxs in the afternoon (sweating and generalized weakness).  Pt says he can afford the insulin.   Past Medical History:  Diagnosis Date  . Arthritis   . Diabetes mellitus, type II, insulin dependent ([place])   . GERD (gastroesophageal reflux disease)   . Hypertension   . Migraines   . Neuropathy in diabetes ([place])    bilat feet  . Ruptured lumbar disc   . Stroke [name]) 2013   residual right sided weakness (arm>leg)    Past Surgical History:  Procedure Laterality Date  . KNEE ARTHROSCOPY Bilateral     Social History   Socioeconomic History  . Marital status: Single    Spouse name: Not on file  . Number of children: Not on file  . Years of education: Not on file  . Highest education level: Not on file  Occupational History  . Not on file  Social Needs  . Financial resource strain: Not on file  . Food insecurity:    Worry: Not on file    Inability: Not on file  . Transportation needs:    Medical: Not on file    Non-medical: Not on file  Tobacco Use  . Smoking status: Current Every Day Smoker    Packs/day: 0.50    Types: Cigarettes  . Smokeless tobacco: Never Used  Substance and Sexual Activity  . Alcohol use: No  . Drug use: Yes    Types: Marijuana  . Sexual activity: Not on file    Lifestyle  . Physical activity:    Days per week: Not on file    Minutes per session: Not on file  . Stress: Not on file  Relationships  . Social connections:    Talks on phone: Not on file    Gets together: Not on file    Attends religious service: Not on file    Active member of club or organization: Not on file    Attends meetings of clubs or organizations: Not on file    Relationship status: Not on file  . Intimate partner violence:    Fear of current or ex partner: Not on file    Emotionally abused: Not on file    Physically abused: Not on file    Forced sexual activity: Not on file  Other Topics Concern  . Not on file  Social History Narrative  . Not on file    Current Outpatient Medications on File Prior to Visit  Medication Sig Dispense Refill  . ACCU-CHEK AVIVA PLUS test strip Use 1 test strip to test blood sugar 2-3 times daily  1  . amLODipine (NORVASC) 10 MG tablet Take 1 tablet (10 mg total) by mouth daily. 90 tablet 1  . blood glucose meter kit and supplies KIT Dispense based on patient and insurance preference. Check blood sugar 4  times a day 1 each 0  . chlorhexidine (PERIDEX) 0.12 % solution Swish with 73ms for 30 seconds then spit may use 2 times daily  2  . chlorthalidone (HYGROTON) 25 MG tablet Take 1 tablet (25 mg total) by mouth daily. 90 tablet 1  . Cholecalciferol (VITAMIN D3) 2000 units TABS Take 1 tablet by mouth every morning. 30 tablet 1  . clindamycin (CLEOCIN) 300 MG capsule TAKE 1 CAPSULE BY MOUTH 2 TIMES DAILY FOR 7 DAYS  0  . cloNIDine (CATAPRES) 0.2 MG tablet Take 1 tablet (0.2 mg total) by mouth 2 (two) times daily. 180 tablet 1  . clopidogrel (PLAVIX) 75 MG tablet Take 75 mg by mouth daily.    . Continuous Blood Gluc Sensor (FREESTYLE LIBRE 14 DAY SENSOR) MISC 1 Device by Does not apply route every 14 (fourteen) days. 6 each 3  . insulin aspart protamine - aspart (NOVOLOG MIX 70/30 FLEXPEN) (70-30) 100 UNIT/ML FlexPen Inject 0.28 mLs (28  Units total) into the skin every morning. 15 mL 11  . insulin aspart protamine- aspart (NOVOLOG MIX 70/30) (70-30) 100 UNIT/ML injection Inject 0.15 mLs (15 Units total) into the skin at bedtime. 10 mL 11  . NARCAN 4 MG/0.1ML LIQD nasal spray kit     . Oxycodone HCl 20 MG TABS     . pantoprazole (PROTONIX) 40 MG tablet Take 1 tablet (40 mg total) by mouth daily. 30 tablet 2  . pravastatin (PRAVACHOL) 80 MG tablet Take 1 tablet (80 mg total) by mouth at bedtime. 90 tablet 1  . sildenafil (REVATIO) 20 MG tablet Take 1-3 tablets by mouth as needed 30 tablet 2  . tamsulosin (FLOMAX) 0.4 MG CAPS capsule Take 1 capsule (0.4 mg total) by mouth daily. 90 capsule 1   No current facility-administered medications on file prior to visit.     No Known Allergies  Family History  Problem Relation Age of Onset  . Other Mother   . Stroke Father   . Hypertension Father   . Epilepsy Father   . Breast cancer Sister   . Stroke Brother   . Heart attack Brother   . Diabetes Sister     BP (!) 162/88   Pulse 90   Ht 6' (1.829 m)   Wt 207 lb (93.9 kg)   SpO2 98%   BMI 28.07 kg/m   Review of Systems Denies LOC.      Objective:   Physical Exam VITAL SIGNS:  See vs page GENERAL: no distress Pulses: foot pulses are intact bilaterally.   MSK: no deformity of the feet or ankles.  CV: 2+ bilat edema of the legs Skin:  no ulcer on the feet or ankles.  normal color and temp on the feet and ankles Neuro: sensation is intact to touch on the feet and ankles.   Ext: bilateral onychomycosis of the toenails.      Lab Results  Component Value Date   HGBA1C 8.0 (A) 07/15/2018   Lab Results  Component Value Date   CREATININE 2.94 (H) 05/05/2018   BUN 27 (H) 05/05/2018   NA 137 05/05/2018   K 4.5 05/05/2018   CL 102 05/05/2018   CO2 25 05/05/2018       Assessment & Plan:  Your blood pressure is high today.  Please see your primary care provider soon, to have it rechecked CVA: this prevents pt  from using cbg monitor, but he can use continuous glucose monitor.   Renal failure: in this context,  he is at risk for hypoglycemia.    Patient Instructions  Your blood pressure is high today.  Please see your primary care provider soon, to have it rechecked Mickel Baas will call you back, about the continuous glucose monitor.  This is the next step. On this type of insulin schedule, you should eat meals on a regular schedule.  If a meal is missed or significantly delayed, your blood sugar could go low.  Please come back for a follow-up appointment in 1 month.

## 2018-07-15 NOTE — Patient Instructions (Addendum)
Your blood pressure is high today.  Please see your primary care provider soon, to have it rechecked Richard [name] will call you back, about the continuous glucose monitor.  This is the next step. On this type of insulin schedule, you should eat meals on a regular schedule.  If a meal is missed or significantly delayed, your blood sugar could go low.  Please come back for a follow-up appointment in 1 month.

## 2018-08-13 ENCOUNTER — Ambulatory Visit: Payer: Medicare HMO | Admitting: Endocrinology

## 2018-08-13 DIAGNOSIS — Z0289 Encounter for other administrative examinations: Secondary | ICD-10-CM

## 2018-08-24 ENCOUNTER — Telehealth: Payer: Self-pay | Admitting: Family Medicine

## 2018-08-24 ENCOUNTER — Other Ambulatory Visit: Payer: Self-pay | Admitting: Family Medicine

## 2018-08-24 DIAGNOSIS — N529 Male erectile dysfunction, unspecified: Secondary | ICD-10-CM

## 2018-08-24 NOTE — Telephone Encounter (Signed)
Copied from [place] [phone]. Topic: Quick Communication - See Telephone Encounter >> Aug 24, 2018 12:44 PM Conception Chancy, NT wrote: CRM for notification. See Telephone encounter for: 08/24/18.  [facility] and is needing to know if the patient is taking both sildenafil (REVATIO) 20 MG tablet  and tadalafil (CIALIS) 20 MG tablet. Requesting call back from nurse.   Cb# [phone]

## 2018-08-24 NOTE — Telephone Encounter (Signed)
Should patient be on both of these?

## 2018-08-24 NOTE — Telephone Encounter (Signed)
Per office note on 06-11-18 with Dr. Ethlyn Gallery, the Tadalafil was discontinued. / Spoke with Jarrett Soho at [facility] and advised.

## 2018-08-25 NOTE — Telephone Encounter (Signed)
No he shouldn't take both. See which he prefers and at what dose.

## 2018-08-27 ENCOUNTER — Other Ambulatory Visit: Payer: Self-pay | Admitting: Family Medicine

## 2018-08-30 ENCOUNTER — Other Ambulatory Visit: Payer: Self-pay

## 2018-08-30 MED ORDER — CLOPIDOGREL BISULFATE 75 MG PO TABS: 75.0000 mg | ORAL_TABLET | Freq: Every day | ORAL | 1 refills | Status: DC

## 2018-08-30 NOTE — Telephone Encounter (Signed)
Medication is historical  Ok to fill?

## 2018-08-31 ENCOUNTER — Other Ambulatory Visit: Payer: Self-pay | Admitting: Family Medicine

## 2018-09-08 ENCOUNTER — Ambulatory Visit (INDEPENDENT_AMBULATORY_CARE_PROVIDER_SITE_OTHER): Payer: Medicare HMO | Admitting: Podiatry

## 2018-09-08 ENCOUNTER — Encounter: Payer: Self-pay | Admitting: Podiatry

## 2018-09-08 DIAGNOSIS — D689 Coagulation defect, unspecified: Secondary | ICD-10-CM | POA: Diagnosis not present

## 2018-09-08 DIAGNOSIS — M79674 Pain in right toe(s): Secondary | ICD-10-CM

## 2018-09-08 DIAGNOSIS — Q828 Other specified congenital malformations of skin: Secondary | ICD-10-CM | POA: Diagnosis not present

## 2018-09-08 DIAGNOSIS — B351 Tinea unguium: Secondary | ICD-10-CM

## 2018-09-08 DIAGNOSIS — M79675 Pain in left toe(s): Secondary | ICD-10-CM

## 2018-09-08 DIAGNOSIS — E119 Type 2 diabetes mellitus without complications: Secondary | ICD-10-CM

## 2018-09-08 NOTE — Progress Notes (Signed)
Complaint:  Visit Type: Patient returns to my office for continued preventative foot care services. Complaint: Patient states" my nails have grown long and thick and become painful to walk and wear shoes" Patient has been diagnosed with DM with no foot complications. The patient presents for preventative foot care services. No changes to ROS.  Patient is taking plavix.  Patient also says the callus on the outside top of his left foot is painful but much improved from first visit.  Podiatric Exam: Vascular: dorsalis pedis and posterior tibial pulses are palpable bilateral. Capillary return is immediate. Temperature gradient is WNL. Skin turgor WNL  Sensorium: Normal Semmes Weinstein monofilament test. Normal tactile sensation bilaterally. Nail Exam: Pt has thick disfigured discolored nails with subungual debris noted bilateral entire nail hallux through fifth toenails Ulcer Exam: There is no evidence of ulcer or pre-ulcerative changes or infection. Orthopedic Exam: Muscle tone and strength are WNL. No limitations in general ROM. No crepitus or effusions noted. Foot type and digits show no abnormalities. Bony prominences are unremarkable. Skin: No Porokeratosis. No infection or ulcers.  Blackened callus lesion over fifth metabase left foot.  Diagnosis:  Onychomycosis, , Pain in right toe, pain in left toes,  Porokeratosis left foot.  Treatment & Plan Procedures and Treatment: Consent by patient was obtained for treatment procedures.   Debridement of mycotic and hypertrophic toenails, 1 through 5 bilateral and clearing of subungual debris. No ulceration, no infection noted. Upon debridement of skin lesion left foot the base of the skin  torewith minimal bleeding.  Silvadene/DSD applied.  Return if skin lesion becomes symptomatic. Return Visit-Office Procedure: Patient instructed to return to the office for a follow up visit 3 months for continued evaluation and treatment.    Gardiner Barefoot DPM

## 2018-09-20 ENCOUNTER — Encounter (HOSPITAL_BASED_OUTPATIENT_CLINIC_OR_DEPARTMENT_OTHER): Payer: Self-pay | Admitting: Emergency Medicine

## 2018-09-20 ENCOUNTER — Other Ambulatory Visit: Payer: Self-pay

## 2018-09-20 ENCOUNTER — Emergency Department (HOSPITAL_BASED_OUTPATIENT_CLINIC_OR_DEPARTMENT_OTHER): Payer: Medicare HMO

## 2018-09-20 ENCOUNTER — Emergency Department (HOSPITAL_BASED_OUTPATIENT_CLINIC_OR_DEPARTMENT_OTHER)
Admission: EM | Admit: 2018-09-20 | Discharge: 2018-09-20 | Disposition: A | Discharge status: Home / Self Care | Payer: Medicare HMO | Attending: Emergency Medicine | Admitting: Emergency Medicine

## 2018-09-20 DIAGNOSIS — B349 Viral infection, unspecified: Secondary | ICD-10-CM | POA: Diagnosis not present

## 2018-09-20 DIAGNOSIS — N183 Chronic kidney disease, stage 3 (moderate): Secondary | ICD-10-CM | POA: Insufficient documentation

## 2018-09-20 DIAGNOSIS — Z794 Long term (current) use of insulin: Secondary | ICD-10-CM | POA: Diagnosis not present

## 2018-09-20 DIAGNOSIS — Z79899 Other long term (current) drug therapy: Secondary | ICD-10-CM | POA: Insufficient documentation

## 2018-09-20 DIAGNOSIS — I129 Hypertensive chronic kidney disease with stage 1 through stage 4 chronic kidney disease, or unspecified chronic kidney disease: Secondary | ICD-10-CM | POA: Insufficient documentation

## 2018-09-20 DIAGNOSIS — J069 Acute upper respiratory infection, unspecified: Secondary | ICD-10-CM

## 2018-09-20 DIAGNOSIS — E1122 Type 2 diabetes mellitus with diabetic chronic kidney disease: Secondary | ICD-10-CM | POA: Insufficient documentation

## 2018-09-20 DIAGNOSIS — R05 Cough: Secondary | ICD-10-CM | POA: Diagnosis present

## 2018-09-20 DIAGNOSIS — B9789 Other viral agents as the cause of diseases classified elsewhere: Secondary | ICD-10-CM

## 2018-09-20 DIAGNOSIS — F17210 Nicotine dependence, cigarettes, uncomplicated: Secondary | ICD-10-CM | POA: Diagnosis not present

## 2018-09-20 DIAGNOSIS — E1140 Type 2 diabetes mellitus with diabetic neuropathy, unspecified: Secondary | ICD-10-CM | POA: Diagnosis not present

## 2018-09-20 MED ORDER — IPRATROPIUM-ALBUTEROL 0.5-2.5 (3) MG/3ML IN SOLN
3.0000 mL | Freq: Once | RESPIRATORY_TRACT | Status: AC
  Administered 2018-09-20: 3 mL via RESPIRATORY_TRACT
  Filled 2018-09-20: qty 3

## 2018-09-20 MED ORDER — BENZONATATE 100 MG PO CAPS: 200.0000 mg | ORAL_CAPSULE | Freq: Three times a day (TID) | ORAL | 0 refills | Status: AC | PRN

## 2018-09-20 MED ORDER — ALBUTEROL SULFATE HFA 108 (90 BASE) MCG/ACT IN AERS: 2.0000 | INHALATION_SPRAY | Freq: Once | RESPIRATORY_TRACT | Status: DC

## 2018-09-20 MED ORDER — AEROCHAMBER PLUS FLO-VU MEDIUM MISC
1.0000 | Freq: Once | Status: DC
  Filled 2018-09-20: qty 1

## 2018-09-20 NOTE — ED Triage Notes (Signed)
Flu like symptoms for three days.  Runny nose, congestion, cough, body aches, some diarrhea in the beginning.  Having cold chills, and hot flashes.

## 2018-09-20 NOTE — ED Provider Notes (Signed)
[facility] EMERGENCY DEPARTMENT Provider Note   CSN: [id number] Arrival date & time: 09/20/18  1303     History   Chief Complaint Chief Complaint  Patient presents with  . URI    HPI Richard [name] is a 62 y.o. male.  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  3 days, he has had cough associated with nasal congestion, runny nose, body aches, cold chills, hot flashes, and initially some diarrhea.  He has had no abdominal pain or vomiting.  He tried taking Mucinex without relief.  He denies any sick contacts or recent travel.  He denies any history of lung disease.  The history is provided by the patient.  URI   There has been no fever.    Past Medical History:  Diagnosis Date  . Arthritis   . Diabetes mellitus, type II, insulin dependent ([place])   . GERD (gastroesophageal reflux disease)   . Hypertension   . Migraines   . Neuropathy in diabetes ([place])    bilat feet  . Ruptured lumbar disc   . Stroke [facility]) 2013   residual right sided weakness (arm>leg)    Patient Active Problem List   Diagnosis Date Noted  . Neuropathy in diabetes ([place])   . Migraines   . Hypertension   . GERD (gastroesophageal reflux disease)   . Diabetes mellitus, type II, insulin dependent ([place])   . Arthritis   . Hyperglycemia 11/29/2014  . Stroke ([place]) 08/26/2012  . Hyperlipidemia 08/25/2012  . Stage 3 chronic renal impairment associated with type 2 diabetes mellitus ([place]) 08/23/2012  . Tobacco abuse 08/23/2012  . Protein-calorie malnutrition, mild ([place]) 08/23/2012  . Type 2 diabetes mellitus ([place]) 08/23/2012  . 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 Hypertensive emergency 05/21/2012    Past Surgical History:  Procedure Laterality Date  . KNEE ARTHROSCOPY Bilateral          Home Medications    Prior to Admission medications   Medication Sig Start Date End Date Taking? Authorizing Provider  ACCU-CHEK AVIVA PLUS test strip Use 1 test strip to test blood sugar 2-3 times daily 04/20/18   [provider]  ACCU-CHEK AVIVA PLUS test strip Use 1 test strip 2-3 times daily 08/31/18   Koberlein, Junell C, MD  amLODipine (NORVASC) 10 MG tablet Take 1 tablet (10 mg total) by mouth daily. 06/11/18   Koberlein, [name], MD  benzonatate (TESSALON) 100 MG capsule Take 2 capsules (200 mg total) by mouth 3 (three) times daily as needed for up to 4 days for cough. 09/20/18 09/24/18  Mayson Mcneish, Wenda Overland, MD  blood glucose meter kit and supplies KIT Dispense based on patient and insurance preference. Check blood sugar 4 times a day 06/11/18   [name], MD  chlorhexidine (PERIDEX) 0.12 % solution Swish with 31ms for 30 seconds then spit may use 2 times daily 06/01/18   [provider]  chlorthalidone (HYGROTON) 25 MG tablet Take 1 tablet (25 mg total) by mouth daily. 06/11/18   KCaren Macadam MD  Cholecalciferol (VITAMIN D) 50 MCG (2000 UT) tablet Take 1 tablet by mouth every morning. 08/27/18   Koberlein, JSteele Berg MD  clindamycin (CLEOCIN) 300 MG capsule TAKE 1 CAPSULE BY MOUTH 2 TIMES DAILY FOR 7 DAYS 03/15/18   [provider]  cloNIDine (CATAPRES) 0.2 MG tablet Take 1  tablet (0.2 mg total) by mouth 2 (two) times daily. 06/11/18   [name], MD  clopidogrel (PLAVIX) 75 MG tablet Take 1 tablet (75 mg total) by mouth daily. 08/30/18   Koberlein, [name], MD  Continuous Blood Gluc Sensor (FREESTYLE LIBRE 14 DAY SENSOR) MISC 1 Device by Does not apply route every 14 (fourteen) days. 07/01/18   Renato Shin, MD  insulin aspart protamine - aspart (NOVOLOG MIX 70/30 FLEXPEN) (70-30) 100 UNIT/ML FlexPen Inject 0.28 mLs (28 Units total) into the skin every morning. 06/11/18   Koberlein, [name], MD  insulin aspart protamine- aspart  (NOVOLOG MIX 70/30) (70-30) 100 UNIT/ML injection Inject 0.15 mLs (15 Units total) into the skin at bedtime. 06/11/18   [name], MD  NARCAN 4 MG/0.1ML LIQD nasal spray kit  05/31/18   [provider]  Oxycodone HCl 20 MG TABS  05/31/18   [provider]  pantoprazole (PROTONIX) 40 MG tablet Take 1 tablet (40 mg total) by mouth daily. 07/07/18   [name], MD  pravastatin (PRAVACHOL) 80 MG tablet Take 1 tablet (80 mg total) by mouth at bedtime. 06/11/18   [name], MD  sildenafil (REVATIO) 20 MG tablet Take 1-3 tablets by mouth as needed 06/11/18   [name], MD  tadalafil (ADCIRCA/CIALIS) 20 MG tablet Take 1 tablet by mouth daily as needed for erectile dysfunction 08/26/18   Koberlein, [name], MD  tamsulosin (FLOMAX) 0.4 MG CAPS capsule Take 1 capsule (0.4 mg total) by mouth daily. 06/11/18   [name], MD    Family History Family History  Problem Relation Age of Onset  . Other Mother   . Stroke Father   . Hypertension Father   . Epilepsy Father   . Breast cancer Sister   . Stroke Brother   . Heart attack Brother   . Diabetes Sister     Social History Social History   Tobacco Use  . Smoking status: Current Every Day Smoker    Packs/day: 0.50    Types: Cigarettes  . Smokeless tobacco: Never Used  Substance Use Topics  . Alcohol use: No  . Drug use: Yes    Types: Marijuana     Allergies   Patient has no known allergies.   Review of Systems Review of Systems All other systems reviewed and are negative except that which was mentioned in HPI   Physical Exam Updated Vital Signs BP (!) 183/90 (BP Location: Left Arm)   Pulse 81   Temp 98.5 F (36.9 C) (Oral)   Resp 14   Ht 6' (1.829 m)   Wt 87.1 kg   SpO2 100%   BMI 26.04 kg/m   Physical Exam Vitals signs and nursing note reviewed.  Constitutional:      General: He is not in acute distress.    Appearance: He is well-developed.  HENT:     Head:  Normocephalic and atraumatic.  Eyes:     Conjunctiva/sclera: Conjunctivae normal.     Pupils: Pupils are equal, round, and reactive to light.  Neck:     Musculoskeletal: Neck supple.  Cardiovascular:     Rate and Rhythm: Normal rate and regular rhythm.     Heart sounds: Normal heart sounds. No murmur.  Pulmonary:     Effort: Pulmonary effort is normal.     Comments: diminished bilaterally without obvious wheezing Abdominal:     General: Bowel sounds are normal. There is no distension.  Palpations: Abdomen is soft.     Tenderness: There is no abdominal tenderness.  Musculoskeletal:     Right lower leg: Edema present.  Skin:    General: Skin is warm and dry.  Neurological:     Mental Status: He is alert and oriented to person, place, and time.     Comments: Fluent speech R arm contracture and mild R leg weakness  Psychiatric:        Judgment: Judgment normal.      ED Treatments / Results  Labs (all labs ordered are listed, but only abnormal results are displayed) Labs Reviewed - No data to display  EKG None  Radiology Dg Chest 2 View  Result Date: 09/20/2018 CLINICAL DATA:  Cough and congestion. EXAM: CHEST - 2 VIEW COMPARISON:  09/06/2017. FINDINGS: Mediastinum and hilar structures normal. Lungs are clear. No pleural effusion or pneumothorax. Heart size normal. No acute bony abnormality. IMPRESSION: No acute cardiopulmonary disease. Electronically Signed   By: Marcello Moores  Register   On: 09/20/2018 13:43    Procedures Procedures (including critical care time)  Medications Ordered in ED Medications  AEROCHAMBER PLUS FLO-VU MEDIUM MISC 1 each (has no administration in time range)  ipratropium-albuterol (DUONEB) 0.5-2.5 (3) MG/3ML nebulizer solution 3 mL (3 mLs Nebulization Given 09/20/18 1540)     Initial Impression / Assessment and Plan / ED Course  I have reviewed the triage vital signs and the nursing notes.  Pertinent imaging results that were available during  my care of the patient were reviewed by me and considered in my medical decision making (see chart for details).    Breathing comfortably on exam, afebrile, O2 saturation 99% on room air.  He had diminished breath sounds bilaterally without obvious wheezing.  Denies any history of asthma or COPD.  Chest x-ray is clear therefore antibiotics not indicated.  Because he is 3 days into his illness and has no underlying immunosuppression or structural lung disease, I do not feel Tamiflu would be of much benefit to him.  I did offer medication for his cough and tried a breathing treatment which did not reveal any wheezing; no indication for steroids. Discussed supportive measures and reviewed return precautions. He voiced understanding.   Final Clinical Impressions(s) / ED Diagnoses   Final diagnoses:  Viral URI with cough    ED Discharge Orders         Ordered    benzonatate (TESSALON) 100 MG capsule  3 times daily PRN     09/20/18 1648           Chatham Howington, Wenda Overland, MD 09/20/18 1651

## 2018-09-23 ENCOUNTER — Other Ambulatory Visit: Payer: Self-pay

## 2018-09-23 ENCOUNTER — Other Ambulatory Visit: Payer: Self-pay | Admitting: Family Medicine

## 2018-09-23 DIAGNOSIS — E785 Hyperlipidemia, unspecified: Secondary | ICD-10-CM

## 2018-09-23 DIAGNOSIS — I10 Essential (primary) hypertension: Secondary | ICD-10-CM

## 2018-09-23 MED ORDER — "PEN NEEDLES 5/16"" 31G X 8 MM MISC": 1 refills | Status: DC

## 2018-10-01 ENCOUNTER — Encounter (HOSPITAL_COMMUNITY): Payer: Self-pay | Admitting: Emergency Medicine

## 2018-10-01 ENCOUNTER — Emergency Department (HOSPITAL_COMMUNITY)
Admission: EM | Admit: 2018-10-01 | Discharge: 2018-10-02 | Disposition: A | Discharge status: Home / Self Care | Payer: Medicare HMO | Attending: Emergency Medicine | Admitting: Emergency Medicine

## 2018-10-01 ENCOUNTER — Other Ambulatory Visit: Payer: Self-pay

## 2018-10-01 DIAGNOSIS — R1110 Vomiting, unspecified: Secondary | ICD-10-CM | POA: Diagnosis present

## 2018-10-01 DIAGNOSIS — Z5321 Procedure and treatment not carried out due to patient leaving prior to being seen by health care provider: Secondary | ICD-10-CM | POA: Diagnosis not present

## 2018-10-01 LAB — CBG MONITORING, ED: Glucose-Capillary: 131 mg/dL — ABNORMAL HIGH (ref 70–99)

## 2018-10-01 NOTE — ED Notes (Signed)
Pt states that he is leaving and going to 69. LWBS.

## 2018-10-01 NOTE — ED Notes (Addendum)
Pt said, I feel like my sugar is dropping." CBG taken. When asked what his blood glucose normally runs, he said that being in the 100s is normal.

## 2018-10-01 NOTE — ED Notes (Signed)
Pt is able to ambulate under his own power with his cane.

## 2018-10-01 NOTE — ED Notes (Signed)
Also, pt complains about pain to his right arm.

## 2018-10-01 NOTE — ED Triage Notes (Signed)
Patient seen 12/30 for same. Patient complaining of flu-like symptoms. Patient is a poor historian and unwilling to answer most questions about his symptoms. When asking what symptoms he is having patient just states, "I don't know, I just feel bad." Patient noted to be agitated in triage. Patient states he was seen previously and states, "they did nothing for me."

## 2018-10-02 ENCOUNTER — Emergency Department (HOSPITAL_BASED_OUTPATIENT_CLINIC_OR_DEPARTMENT_OTHER)
Admission: EM | Admit: 2018-10-02 | Discharge: 2018-10-02 | Disposition: A | Discharge status: Home / Self Care | Payer: Medicare HMO | Source: Home / Self Care | Attending: Emergency Medicine | Admitting: Emergency Medicine

## 2018-10-02 ENCOUNTER — Emergency Department (HOSPITAL_BASED_OUTPATIENT_CLINIC_OR_DEPARTMENT_OTHER): Payer: Medicare HMO

## 2018-10-02 ENCOUNTER — Encounter (HOSPITAL_BASED_OUTPATIENT_CLINIC_OR_DEPARTMENT_OTHER): Payer: Self-pay

## 2018-10-02 DIAGNOSIS — Z794 Long term (current) use of insulin: Secondary | ICD-10-CM | POA: Insufficient documentation

## 2018-10-02 DIAGNOSIS — Z7901 Long term (current) use of anticoagulants: Secondary | ICD-10-CM

## 2018-10-02 DIAGNOSIS — I10 Essential (primary) hypertension: Secondary | ICD-10-CM

## 2018-10-02 DIAGNOSIS — F17210 Nicotine dependence, cigarettes, uncomplicated: Secondary | ICD-10-CM

## 2018-10-02 DIAGNOSIS — E119 Type 2 diabetes mellitus without complications: Secondary | ICD-10-CM

## 2018-10-02 DIAGNOSIS — B349 Viral infection, unspecified: Secondary | ICD-10-CM

## 2018-10-02 DIAGNOSIS — R1110 Vomiting, unspecified: Secondary | ICD-10-CM | POA: Diagnosis not present

## 2018-10-02 DIAGNOSIS — Z79899 Other long term (current) drug therapy: Secondary | ICD-10-CM

## 2018-10-02 LAB — CBC WITH DIFFERENTIAL/PLATELET
Abs Immature Granulocytes: 0.02 10*3/uL (ref 0.00–0.07)
BASOS ABS: 0.1 10*3/uL (ref 0.0–0.1)
Basophils Relative: 1 %
Eosinophils Absolute: 0 10*3/uL (ref 0.0–0.5)
Eosinophils Relative: 0 %
HCT: 52.7 % — ABNORMAL HIGH (ref 39.0–52.0)
Hemoglobin: 16.4 g/dL (ref 13.0–17.0)
Immature Granulocytes: 0 %
LYMPHS PCT: 38 %
Lymphs Abs: 2.8 10*3/uL (ref 0.7–4.0)
MCH: 28.4 pg (ref 26.0–34.0)
MCHC: 31.1 g/dL (ref 30.0–36.0)
MCV: 91.3 fL (ref 80.0–100.0)
Monocytes Absolute: 0.7 10*3/uL (ref 0.1–1.0)
Monocytes Relative: 10 %
NRBC: 0 % (ref 0.0–0.2)
Neutro Abs: 3.8 10*3/uL (ref 1.7–7.7)
Neutrophils Relative %: 51 %
Platelets: 224 10*3/uL (ref 150–400)
RBC: 5.77 MIL/uL (ref 4.22–5.81)
RDW: 13.9 % (ref 11.5–15.5)
Smear Review: NORMAL
WBC Morphology: >10
WBC: 8 10*3/uL (ref 4.0–10.5)

## 2018-10-02 LAB — COMPREHENSIVE METABOLIC PANEL
ALT: 16 U/L (ref 0–44)
AST: 20 U/L (ref 15–41)
Albumin: 3.4 g/dL — ABNORMAL LOW (ref 3.5–5.0)
Alkaline Phosphatase: 95 U/L (ref 38–126)
Anion gap: 10 (ref 5–15)
BILIRUBIN TOTAL: 0.5 mg/dL (ref 0.3–1.2)
BUN: 32 mg/dL — ABNORMAL HIGH (ref 8–23)
CO2: 19 mmol/L — ABNORMAL LOW (ref 22–32)
Calcium: 8.3 mg/dL — ABNORMAL LOW (ref 8.9–10.3)
Chloride: 111 mmol/L (ref 98–111)
Creatinine, Ser: 3.31 mg/dL — ABNORMAL HIGH (ref 0.61–1.24)
GFR calc Af Amer: 22 mL/min — ABNORMAL LOW (ref >60)
GFR calc non Af Amer: 19 mL/min — ABNORMAL LOW (ref >60)
Glucose, Bld: 97 mg/dL (ref 70–99)
POTASSIUM: 4.8 mmol/L (ref 3.5–5.1)
Sodium: 140 mmol/L (ref 135–145)
TOTAL PROTEIN: 6.9 g/dL (ref 6.5–8.1)

## 2018-10-02 LAB — LIPASE, BLOOD: Lipase: 25 U/L (ref 11–51)

## 2018-10-02 MED ORDER — ONDANSETRON HCL 4 MG PO TABS: 4.0000 mg | ORAL_TABLET | Freq: Three times a day (TID) | ORAL | 0 refills | Status: DC | PRN

## 2018-10-02 MED ORDER — ONDANSETRON 8 MG PO TBDP
8.0000 mg | ORAL_TABLET | Freq: Once | ORAL | Status: AC
  Administered 2018-10-02: 8 mg via ORAL
  Filled 2018-10-02: qty 1

## 2018-10-02 NOTE — ED Provider Notes (Signed)
[facility] EMERGENCY DEPARTMENT Provider Note   CSN: [id number] Arrival date & time: 10/02/18  0019     History   Chief Complaint Chief Complaint  Patient presents with  . Generalized Body Aches    HPI Richard [name] is a 63 y.o. male.   Cough  Cough characteristics:  Non-productive Severity:  Mild Onset quality:  Gradual Duration:  3 weeks Timing:  Constant Progression:  Unchanged Chronicity:  New Smoker: yes   Context: not smoke exposure and not with activity   Relieved by:  Nothing Worsened by:  Nothing Ineffective treatments:  Decongestant, rest and cough suppressants Associated symptoms: ear fullness, ear pain, myalgias, rhinorrhea, shortness of breath, sinus congestion and sore throat   Associated symptoms: no chest pain     Past Medical History:  Diagnosis Date  . Arthritis   . Diabetes mellitus, type II, insulin dependent ([place])   . GERD (gastroesophageal reflux disease)   . Hypertension   . Migraines   . Neuropathy in diabetes ([place])    bilat feet  . Ruptured lumbar disc   . Stroke [facility]) 2013   residual right sided weakness (arm>leg)    Patient Active Problem List   Diagnosis Date Noted  . Neuropathy in diabetes ([place])   . Migraines   . Hypertension   . GERD (gastroesophageal reflux disease)   . Diabetes mellitus, type II, insulin dependent ([place])   . Arthritis   . Hyperglycemia 11/29/2014  . Stroke ([place]) 08/26/2012  . Hyperlipidemia 08/25/2012  . Stage 3 chronic renal impairment associated with type 2 diabetes mellitus ([place]) 08/23/2012  . Tobacco abuse 08/23/2012  . Protein-calorie malnutrition, mild ([place]) 08/23/2012  . Type 2 diabetes mellitus ([place]) 08/23/2012  . 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 Hypertensive emergency 05/21/2012    Past Surgical History:  Procedure Laterality Date  . KNEE ARTHROSCOPY Bilateral         Home Medications    Prior to Admission medications     Medication Sig Start Date End Date Taking? Authorizing Provider  ACCU-CHEK AVIVA PLUS test strip Use 1 test strip to test blood sugar 2-3 times daily 04/20/18   [provider]  ACCU-CHEK AVIVA PLUS test strip Use 1 test strip 2-3 times daily 08/31/18   Koberlein, Junell C, MD  amLODipine (NORVASC) 10 MG tablet Take 1 tablet (10 mg total) by mouth daily. 06/11/18   [name], MD  blood glucose meter kit and supplies KIT Dispense based on patient and insurance preference. Check blood sugar 4 times a day 06/11/18   [name], MD  chlorhexidine (PERIDEX) 0.12 % solution Swish with 58ms for 30 seconds then spit may use 2 times daily 06/01/18   [provider]  chlorthalidone (HYGROTON) 25 MG tablet Take 1 tablet (25 mg total) by mouth daily. 06/11/18   KCaren Macadam MD  Cholecalciferol (VITAMIN D) 50 MCG (2000 UT) tablet Take 1 tablet by mouth every morning. 08/27/18   Koberlein, JSteele Berg MD  clindamycin (CLEOCIN) 300 MG capsule TAKE 1 CAPSULE BY MOUTH 2 TIMES DAILY FOR 7 DAYS 03/15/18   [provider]  cloNIDine (CATAPRES) 0.2 MG tablet TAKE 1 TABLET BY MOUTH 2 TIMES DAILY 09/23/18   KCaren Macadam MD  clopidogrel (PLAVIX) 75 MG tablet Take 1 tablet (75 mg total) by mouth daily. 08/30/18   KCaren Macadam MD  Continuous Blood Gluc Sensor (FREESTYLE LIBRE 14 DAY SENSOR) MISC 1 Device  by Does not apply route every 14 (fourteen) days. 07/01/18   Renato Shin, MD  insulin aspart protamine - aspart (NOVOLOG MIX 70/30 FLEXPEN) (70-30) 100 UNIT/ML FlexPen Inject 0.28 mLs (28 Units total) into the skin every morning. 06/11/18   Koberlein, [name], MD  insulin aspart protamine- aspart (NOVOLOG MIX 70/30) (70-30) 100 UNIT/ML injection Inject 0.15 mLs (15 Units total) into the skin at bedtime. 06/11/18   Koberlein, [name], MD  Insulin Pen Needle (PEN NEEDLES 31GX5/16") 31G X 8 MM MISC To be used with Novolog flex pens. Inject twice a day. 09/23/18    [name], MD  NARCAN 4 MG/0.1ML LIQD nasal spray kit  05/31/18   [provider]  ondansetron (ZOFRAN) 4 MG tablet Take 1 tablet (4 mg total) by mouth every 8 (eight) hours as needed for nausea or vomiting. 10/02/18   Stanley Helmuth, Corene Cornea, MD  Oxycodone HCl 20 MG TABS  05/31/18   [provider]  pantoprazole (PROTONIX) 40 MG tablet Take 1 tablet (40 mg total) by mouth daily. 07/07/18   Koberlein, [name], MD  pravastatin (PRAVACHOL) 80 MG tablet TAKE 1 TABLET BY MOUTH AT BEDTIME 09/23/18   Koberlein, Andris Flurry C, MD  sildenafil (REVATIO) 20 MG tablet Take 1-3 tablets by mouth as needed 06/11/18   [name], MD  tadalafil (ADCIRCA/CIALIS) 20 MG tablet Take 1 tablet by mouth daily as needed for erectile dysfunction 08/26/18   Koberlein, [name], MD  tamsulosin (FLOMAX) 0.4 MG CAPS capsule Take 1 capsule (0.4 mg total) by mouth daily. 06/11/18   [name], MD    Family History Family History  Problem Relation Age of Onset  . Other Mother   . Stroke Father   . Hypertension Father   . Epilepsy Father   . Breast cancer Sister   . Stroke Brother   . Heart attack Brother   . Diabetes Sister     Social History Social History   Tobacco Use  . Smoking status: Current Every Day Smoker    Packs/day: 0.50    Types: Cigarettes  . Smokeless tobacco: Never Used  Substance Use Topics  . Alcohol use: No  . Drug use: Yes    Types: Marijuana     Allergies   Patient has no known allergies.   Review of Systems Review of Systems  HENT: Positive for ear pain, rhinorrhea and sore throat.   Respiratory: Positive for cough and shortness of breath.   Cardiovascular: Negative for chest pain.  Musculoskeletal: Positive for myalgias.  All other systems reviewed and are negative.    Physical Exam Updated Vital Signs BP (!) 162/78 (BP Location: Left Arm)   Pulse 81   Temp 98.2 F (36.8 C) (Oral)   Resp 18   Ht 6' (1.829 m)   Wt 89.8 kg   SpO2 100%   BMI  26.85 kg/m   Physical Exam Vitals signs and nursing note reviewed.  Constitutional:      Appearance: He is well-developed.  HENT:     Head: Normocephalic and atraumatic.     Right Ear: Tympanic membrane normal.     Left Ear: Tympanic membrane normal.     Nose: Congestion present. No rhinorrhea.  Eyes:     Extraocular Movements: Extraocular movements intact.     Conjunctiva/sclera: Conjunctivae normal.  Neck:     Musculoskeletal: Normal range of motion.  Cardiovascular:     Rate and Rhythm: Normal rate.     Heart sounds:  No murmur.  Pulmonary:     Effort: Pulmonary effort is normal. No respiratory distress.  Abdominal:     General: There is no distension.  Musculoskeletal: Normal range of motion.  Skin:    General: Skin is warm and dry.  Neurological:     Mental Status: He is alert.      ED Treatments / Results  Labs (all labs ordered are listed, but only abnormal results are displayed) Labs Reviewed  CBC WITH DIFFERENTIAL/PLATELET - Abnormal; Notable for the following components:      Result Value   HCT 52.7 (*)    All other components within normal limits  COMPREHENSIVE METABOLIC PANEL - Abnormal; Notable for the following components:   CO2 19 (*)    BUN 32 (*)    Creatinine, Ser 3.31 (*)    Calcium 8.3 (*)    Albumin 3.4 (*)    GFR calc non Af Amer 19 (*)    GFR calc Af Amer 22 (*)    All other components within normal limits  LIPASE, BLOOD    EKG EKG Interpretation  Date/Time:  Saturday October 02 2018 02:07:58 EST Ventricular Rate:  69 PR Interval:    QRS Duration: 84 QT Interval:  420 QTC Calculation: 450 R Axis:   33 Text Interpretation:  Sinus rhythm Probable left atrial enlargement Probable anteroseptal infarct, old No acute changes Confirmed by Merrily Pew [phone]) on 10/02/2018 5:17:29 AM   Radiology Dg Chest 2 View  Result Date: 10/02/2018 CLINICAL DATA:  Cough EXAM: CHEST - 2 VIEW COMPARISON:  09/20/2018 FINDINGS: The heart size and  mediastinal contours are within normal limits. Both lungs are clear. The visualized skeletal structures are unremarkable. Nipple shadows over the lower lungs. IMPRESSION: No active cardiopulmonary disease. Electronically Signed   By: Donavan Foil M.D.   On: 10/02/2018 02:28    Procedures Procedures (including critical care time)  Medications Ordered in ED Medications  ondansetron (ZOFRAN-ODT) disintegrating tablet 8 mg (8 mg Oral Given 10/02/18 0157)     Initial Impression / Assessment and Plan / ED Course  I have reviewed the triage vital signs and the nursing notes.  Pertinent labs & imaging results that were available during my care of the patient were reviewed by me and considered in my medical decision making (see chart for details).     States not been able to eat or drink anything in 3 weeks however does not appear significantly dehydrated nor does it his labs support that.  Also states he has dry cough for 3 weeks however did not hear him cough one time was in emergency room for multiple hours.  EKG normal.  Labs normal.  No respiratory distress.  Patient sleeping without difficulty, vomiting or other associated symptoms.  Tolerating p.o.  Patient stable for discharge to continue following up with his primary doctor.  Final Clinical Impressions(s) / ED Diagnoses   Final diagnoses:  Viral illness    ED Discharge Orders         Ordered    ondansetron (ZOFRAN) 4 MG tablet  Every 8 hours PRN     10/02/18 0330           Liliyana Thobe, Corene Cornea, MD 10/02/18 [phone]

## 2018-10-02 NOTE — ED Notes (Signed)
Gave patient crackers and sprite for po challenge.

## 2018-10-02 NOTE — ED Notes (Signed)
Patient transported to X-ray 

## 2018-10-02 NOTE — ED Triage Notes (Addendum)
Pt c/o flu like sx's worsening over past 3wks.  "I can't hold nothing on my stomach, pain in my left side and my shoulder aches" Pt just left [facility]. Dxi with viral URI 12/30 and sts sx's no better.

## 2018-10-15 ENCOUNTER — Other Ambulatory Visit: Payer: Self-pay | Admitting: Family Medicine

## 2018-10-15 NOTE — Telephone Encounter (Signed)
Both sildenafil and tadalafil are on the med list. Tadalafil is the most recent one filled.   Which one should patient be one?

## 2018-10-15 NOTE — Telephone Encounter (Signed)
I remember you calling him specifically for this question of preferred medication in the past and I do not see any documentation. Please check again with him if you do not recall. I assume that it was the tadalafil that he prefers since that was what was filled near time of last phone. Please send back preferred med for refill to me. I do not want him using both.

## 2018-10-27 ENCOUNTER — Ambulatory Visit: Payer: Medicare HMO | Admitting: Family Medicine

## 2018-11-01 ENCOUNTER — Other Ambulatory Visit: Payer: Self-pay | Admitting: Family Medicine

## 2018-11-01 ENCOUNTER — Ambulatory Visit (INDEPENDENT_AMBULATORY_CARE_PROVIDER_SITE_OTHER): Payer: Medicare HMO | Admitting: Family Medicine

## 2018-11-01 ENCOUNTER — Encounter: Payer: Self-pay | Admitting: Family Medicine

## 2018-11-01 VITALS — BP 158/78 | HR 85 | Temp 98.4°F | Wt 196.6 lb

## 2018-11-01 DIAGNOSIS — N400 Enlarged prostate without lower urinary tract symptoms: Secondary | ICD-10-CM | POA: Diagnosis not present

## 2018-11-01 DIAGNOSIS — L309 Dermatitis, unspecified: Secondary | ICD-10-CM | POA: Diagnosis not present

## 2018-11-01 DIAGNOSIS — N529 Male erectile dysfunction, unspecified: Secondary | ICD-10-CM

## 2018-11-01 MED ORDER — TRIAMCINOLONE ACETONIDE 0.1 % EX CREA: 1.0000 "application " | TOPICAL_CREAM | Freq: Two times a day (BID) | CUTANEOUS | 0 refills | Status: DC

## 2018-11-01 MED ORDER — TAMSULOSIN HCL 0.4 MG PO CAPS: 0.4000 mg | ORAL_CAPSULE | Freq: Every day | ORAL | 1 refills | Status: DC

## 2018-11-01 MED ORDER — SILDENAFIL CITRATE 20 MG PO TABS: ORAL_TABLET | ORAL | 5 refills | Status: DC

## 2018-11-01 MED ORDER — CLOTRIMAZOLE 1 % EX CREA: 1.0000 "application " | TOPICAL_CREAM | Freq: Two times a day (BID) | CUTANEOUS | 0 refills | Status: DC

## 2018-11-01 NOTE — Patient Instructions (Addendum)
Consider anti-histamine (like benadryl) to help with itching. (avoid "D").   Clotrimazole to groin; triamcinolone for itching.   Avoid hot showers/baths as this will worsen itching. Keep skin well moisturized. Recommend using dove soap for washing (bar soap). Double rinse clothes. No new lotions, detergents, fabric softeners, etc. Fragrance free detergent.   Please check your blood pressures at home. I would like for you to report them back to [place] in 2 weeks.

## 2018-11-01 NOTE — Progress Notes (Addendum)
Richard [name] DOB: October 25, 1955 Encounter date: 11/01/2018  This is a 63 y.o. male who presents with Chief Complaint  Patient presents with  . Rash    History of present illness:  Itches on knees, legs. Neck, arms, back. Skin is just very itchy. Tiny bumps. Started over a month ago. Thinks about the same; not getting worse. Sometimes itches very badly.   Initially thought cheap laundry detergent, but hasn't gone away since switching back. Has tried cortisone cream which helps.   Original sildenafil works best for him but usually with the sidenafil he will take 4-5 tablets. Has been combining the tadalafil and sildenafil on occasion. Feels this works best for him.    No Known Allergies Current Meds  Medication Sig  . ACCU-CHEK AVIVA PLUS test strip Use 1 test strip to test blood sugar 2-3 times daily  . ACCU-CHEK AVIVA PLUS test strip Use 1 test strip 2-3 times daily  . amLODipine (NORVASC) 10 MG tablet Take 1 tablet (10 mg total) by mouth daily.  . blood glucose meter kit and supplies KIT Dispense based on patient and insurance preference. Check blood sugar 4 times a day  . chlorhexidine (PERIDEX) 0.12 % solution Swish with 42ms for 30 seconds then spit may use 2 times daily  . chlorthalidone (HYGROTON) 25 MG tablet Take 1 tablet (25 mg total) by mouth daily.  . Cholecalciferol (VITAMIN D) 50 MCG (2000 UT) tablet Take 1 tablet by mouth every morning.  . clindamycin (CLEOCIN) 300 MG capsule TAKE 1 CAPSULE BY MOUTH 2 TIMES DAILY FOR 7 DAYS  . cloNIDine (CATAPRES) 0.2 MG tablet TAKE 1 TABLET BY MOUTH 2 TIMES DAILY  . clopidogrel (PLAVIX) 75 MG tablet Take 1 tablet (75 mg total) by mouth daily.  . Continuous Blood Gluc Sensor (FREESTYLE LIBRE 14 DAY SENSOR) MISC 1 Device by Does not apply route every 14 (fourteen) days.  . insulin aspart protamine - aspart (NOVOLOG MIX 70/30 FLEXPEN) (70-30) 100 UNIT/ML FlexPen Inject 0.28 mLs (28 Units total) into the skin every morning.  .  insulin aspart protamine- aspart (NOVOLOG MIX 70/30) (70-30) 100 UNIT/ML injection Inject 0.15 mLs (15 Units total) into the skin at bedtime.  . Insulin Pen Needle (PEN NEEDLES 31GX5/16") 31G X 8 MM MISC To be used with Novolog flex pens. Inject twice a day.  .Marland KitchenNARCAN 4 MG/0.1ML LIQD nasal spray kit   . ondansetron (ZOFRAN) 4 MG tablet Take 1 tablet (4 mg total) by mouth every 8 (eight) hours as needed for nausea or vomiting.  . Oxycodone HCl 20 MG TABS   . pantoprazole (PROTONIX) 40 MG tablet Take 1 tablet (40 mg total) by mouth daily.  . pravastatin (PRAVACHOL) 80 MG tablet TAKE 1 TABLET BY MOUTH AT BEDTIME  . sildenafil (REVATIO) 20 MG tablet Take 1-5 tablets by mouth as needed  . tamsulosin (FLOMAX) 0.4 MG CAPS capsule Take 1 capsule (0.4 mg total) by mouth daily.  . [DISCONTINUED] sildenafil (REVATIO) 20 MG tablet Take 1-3 tablets by mouth as needed  . [DISCONTINUED] tadalafil (ADCIRCA/CIALIS) 20 MG tablet Take 1 tablet by mouth daily as needed for erectile dysfunction  . [DISCONTINUED] tamsulosin (FLOMAX) 0.4 MG CAPS capsule Take 1 capsule (0.4 mg total) by mouth daily.    Review of Systems  Constitutional: Negative for fatigue.  Respiratory: Negative for chest tightness and shortness of breath.   Cardiovascular: Negative for chest pain.  Genitourinary: Negative for discharge and penile pain.       Was alternating  sildenafil and tadalafil. Sometimes was mixing both. Feels sildenafil works better for him overall.   Skin: Positive for rash.       See hpi     Objective:  BP (!) 158/78   Pulse 85   Temp 98.4 F (36.9 C) (Oral)   Wt 196 lb 9.6 oz (89.2 kg)   SpO2 96%   BMI 26.66 kg/m   Weight: 196 lb 9.6 oz (89.2 kg)   BP Readings from Last 3 Encounters:  11/01/18 (!) 158/78  10/02/18 (!) 162/78  10/01/18 (!) 149/88   Wt Readings from Last 3 Encounters:  11/01/18 196 lb 9.6 oz (89.2 kg)  10/02/18 197 lb 15.6 oz (89.8 kg)  10/01/18 198 lb (89.8 kg)    Physical  Exam Constitutional:      Appearance: Normal appearance.  Pulmonary:     Effort: Pulmonary effort is normal.  Skin:    Comments: Nonspecific papular rash over arms, legs. Not erythematous, not warm. Some skin over body is dry. Rash not inflammed. Does not appear to be mite/bed bug/flea. Global.   There is hyperpigmentation intertriginous region bilat. There is mild skin thickening in this area. No other obvious rash or skin abnormality.  Neurological:     Mental Status: He is alert.     Assessment/Plan 1. Prostate hypertrophy Needs refill on flomax; completed today. - tamsulosin (FLOMAX) 0.4 MG CAPS capsule; Take 1 capsule (0.4 mg total) by mouth daily.  Dispense: 90 capsule; Refill: 1  2. Dermatitis Nonspecific but given location of rash on arms, legs, trunk could certainly be detergent related. Skin precautions discussed.  3. Erectile dysfunction, unspecified erectile dysfunction type Will stop tadalafil. Discussed risk of using both medications. Sildenafil works for him and gives him flexibility of dosing.   Check BP at home and report in 2 weeks time   Return if symptoms worsen or fail to improve.     [name], MD

## 2018-11-17 ENCOUNTER — Ambulatory Visit: Payer: Medicare HMO | Admitting: Podiatry

## 2018-12-09 ENCOUNTER — Other Ambulatory Visit: Payer: Self-pay

## 2018-12-09 MED ORDER — PANTOPRAZOLE SODIUM 40 MG PO TBEC: 40.0000 mg | DELAYED_RELEASE_TABLET | Freq: Every day | ORAL | 2 refills | Status: DC

## 2018-12-30 ENCOUNTER — Other Ambulatory Visit: Payer: Self-pay | Admitting: Family Medicine

## 2019-01-03 NOTE — Telephone Encounter (Signed)
Last filled 08/27/18 Should the patient still be on this medication?

## 2019-01-13 ENCOUNTER — Other Ambulatory Visit: Payer: Self-pay | Admitting: Family Medicine

## 2019-01-13 DIAGNOSIS — I10 Essential (primary) hypertension: Secondary | ICD-10-CM

## 2019-01-14 ENCOUNTER — Telehealth: Payer: Self-pay | Admitting: Family Medicine

## 2019-01-14 ENCOUNTER — Other Ambulatory Visit: Payer: Self-pay | Admitting: Family Medicine

## 2019-01-14 MED ORDER — TRIAMCINOLONE ACETONIDE 0.1 % EX CREA: TOPICAL_CREAM | CUTANEOUS | 0 refills | Status: DC

## 2019-01-14 NOTE — Telephone Encounter (Signed)
X 2 day, on arms, back of thighs and back. Patient states that the cream is helping with itch for a short period but is not helping to clear the rash.

## 2019-01-14 NOTE — Telephone Encounter (Signed)
See below

## 2019-01-14 NOTE — Telephone Encounter (Signed)
Patient is requesting a larger tube of Triamcinolone.  Please send to [facility].  Thanks

## 2019-01-14 NOTE — Telephone Encounter (Signed)
Sounds like it is not better. I did refill the cream.   Couple of options: if he has decent web access; we can try to set up web video chat to re-examine rash.   If there is not option, we could have him in office for check on rash.   Or, we could certainly refer to dermatology (I'm just concerned that it might take some time for him to get appointment).   Anything else new he can think of in environment or medications?  And HOW are blood pressures?

## 2019-01-14 NOTE — Telephone Encounter (Signed)
Please see where/how often/on what he is using cream. We can send in larger quantity, but I want to make sure we are not causing more harm than good with steroid cream.

## 2019-01-14 NOTE — Telephone Encounter (Signed)
Please advise. Are we able to send a larger quantity.

## 2019-01-17 NOTE — Telephone Encounter (Signed)
Patient has scheduled a virtual visit.

## 2019-01-19 ENCOUNTER — Other Ambulatory Visit: Payer: Self-pay

## 2019-01-19 ENCOUNTER — Encounter: Payer: Self-pay | Admitting: Family Medicine

## 2019-01-19 ENCOUNTER — Ambulatory Visit (INDEPENDENT_AMBULATORY_CARE_PROVIDER_SITE_OTHER): Payer: Medicare HMO | Admitting: Family Medicine

## 2019-01-19 DIAGNOSIS — L309 Dermatitis, unspecified: Secondary | ICD-10-CM

## 2019-01-19 DIAGNOSIS — Z794 Long term (current) use of insulin: Secondary | ICD-10-CM

## 2019-01-19 DIAGNOSIS — N183 Chronic kidney disease, stage 3 unspecified: Secondary | ICD-10-CM

## 2019-01-19 DIAGNOSIS — I10 Essential (primary) hypertension: Secondary | ICD-10-CM

## 2019-01-19 DIAGNOSIS — E1122 Type 2 diabetes mellitus with diabetic chronic kidney disease: Secondary | ICD-10-CM

## 2019-01-19 MED ORDER — DIPHENHYDRAMINE HCL 50 MG PO TABS: 25.0000 mg | ORAL_TABLET | Freq: Two times a day (BID) | ORAL | 0 refills | Status: DC | PRN

## 2019-01-19 MED ORDER — PERMETHRIN 5 % EX CREA: 1.0000 "application " | TOPICAL_CREAM | Freq: Once | CUTANEOUS | 1 refills | Status: AC

## 2019-01-19 NOTE — Progress Notes (Signed)
Virtual Visit via Video Note  I connected with Richard [name]  on 01/19/19 at 11:30 AM EDT by a video enabled telemedicine application and verified that I am speaking with the correct person using two identifiers.  Location patient: home Location provider:work or home office Persons participating in the virtual visit: patient, provider  I discussed the limitations of evaluation and management by telemedicine and the availability of in person appointments. The patient expressed understanding and agreed to proceed.  We were unable to get the video feed to work and he continued to loose internet connection so visit was done entirely by phone.  Richard [name] DOB: 1956/02/02 Encounter date: 01/19/2019  This is a 63 y.o. male who presents with No chief complaint on file.   History of present illness:  Rash never cleared up since he was here. Feels like it got worse.   On both arms, back, thighs (on back). Still really itchy. Thinks rash started in January. Hasn't changed in appearance much. Possible a little worse. Little bumps on arms, top of back, back of thighs.  Still scattered bumps. No rash on face/neck. Pretty consistent throughout day. No itchy throat, tongue or lip swelling. No rash on hands/feet.   Cannot think of anything new in medication list; which we reviewed together.   Right now using tide that is "free". Hadn't used this before, but started after rash. No other new topicals.   Has tried topical creams from drug store (not sure name). Steroid cream stops itch a little but not clearing it up completely. Does get about 4 hours of relief.   Does recall sleeping away from home a few days prior to rash starting.   Has been following with nephrology regularly. Last visit end of last year and he missed 3 month follow up due to coronavirus. Has been following with endocrinology for diabetes as well Due for visit.  Has been checking his blood pressures at home and  they have been "looking good". Sometimes getting 120's/80's; sometimes systolic is higher, but diastolic staying better typically around 80.     No Known Allergies Current Meds  Medication Sig  . ACCU-CHEK AVIVA PLUS test strip Use 1 test strip to test blood sugar 2-3 times daily  . ACCU-CHEK AVIVA PLUS test strip Use 1 test strip 2-3 times daily  . amLODipine (NORVASC) 10 MG tablet TAKE 1 TABLET BY MOUTH EVERY DAY  . blood glucose meter kit and supplies KIT Dispense based on patient and insurance preference. Check blood sugar 4 times a day  . chlorhexidine (PERIDEX) 0.12 % solution Swish with 27ms for 30 seconds then spit may use 2 times daily  . chlorthalidone (HYGROTON) 25 MG tablet Take 1 tablet (25 mg total) by mouth daily.  . Cholecalciferol (VITAMIN D) 50 MCG (2000 UT) tablet TAKE 1 TABLET BY MOUTH EVERY MORNING  . clindamycin (CLEOCIN) 300 MG capsule TAKE 1 CAPSULE BY MOUTH 2 TIMES DAILY FOR 7 DAYS  . cloNIDine (CATAPRES) 0.2 MG tablet TAKE 1 TABLET BY MOUTH 2 TIMES DAILY  . clopidogrel (PLAVIX) 75 MG tablet Take 1 tablet (75 mg total) by mouth daily.  . clotrimazole (LOTRIMIN) 1 % cream Apply 1 application topically 2 (two) times daily.  . Continuous Blood Gluc Sensor (FREESTYLE LIBRE 14 DAY SENSOR) MISC 1 Device by Does not apply route every 14 (fourteen) days.  . insulin aspart protamine - aspart (NOVOLOG MIX 70/30 FLEXPEN) (70-30) 100 UNIT/ML FlexPen Inject 0.28 mLs (28 Units total) into the  skin every morning.  . insulin aspart protamine- aspart (NOVOLOG MIX 70/30) (70-30) 100 UNIT/ML injection Inject 0.15 mLs (15 Units total) into the skin at bedtime.  . Insulin Pen Needle (PEN NEEDLES 31GX5/16") 31G X 8 MM MISC To be used with Novolog flex pens. Inject twice a day.  Marland Kitchen NARCAN 4 MG/0.1ML LIQD nasal spray kit   . ondansetron (ZOFRAN) 4 MG tablet Take 1 tablet (4 mg total) by mouth every 8 (eight) hours as needed for nausea or vomiting.  . Oxycodone HCl 20 MG TABS   . pantoprazole  (PROTONIX) 40 MG tablet Take 1 tablet (40 mg total) by mouth daily.  . pravastatin (PRAVACHOL) 80 MG tablet TAKE 1 TABLET BY MOUTH AT BEDTIME  . sildenafil (REVATIO) 20 MG tablet Take 1-5 tablets by mouth as needed  . tamsulosin (FLOMAX) 0.4 MG CAPS capsule Take 1 capsule (0.4 mg total) by mouth daily.  Marland Kitchen triamcinolone cream (KENALOG) 0.1 % Apply 1 application to affected area topically 2 times daily.    Review of Systems  Constitutional: Negative for chills and fever.  HENT: Negative for sore throat.   Respiratory: Negative for cough and shortness of breath.   Cardiovascular: Negative for leg swelling.  Musculoskeletal: Negative for arthralgias and joint swelling.  Skin: Positive for rash. Negative for color change and wound.    Objective:  There were no vitals taken for this visit.      BP Readings from Last 3 Encounters:  11/01/18 (!) 158/78  10/02/18 (!) 162/78  10/01/18 (!) 149/88   Wt Readings from Last 3 Encounters:  11/01/18 196 lb 9.6 oz (89.2 kg)  10/02/18 197 lb 15.6 oz (89.8 kg)  10/01/18 198 lb (89.8 kg)    EXAM:  GENERAL: sounds alert, oriented, appears well and in no acute distress  LUNGS: no shortness of breath during conversation  PSYCH/NEURO: pleasant and cooperative, no obvious depression or anxiety, speech and thought processing grossly intact  Assessment/Plan  1. Dermatitis This has been ongoing since January. We have not identified trigger for rash. It is very itchy and there was incidence when he slept away from home shortly before rash started. We discussed treatment options (which are more limited due to coronavirus as I suspect this will delay referral time). We are going to treat to cover for scabies in case this is causing persistent rash. We discussed limiting prolonged water exposure, avoiding heat/hot water as this will intensify any rash. We discussed trial of benadryl for itching. OK to continue steroid cream if needed for time being.  Referral to derm. Could consider biopsy if not improved after above or if derm referral time is prolonged. May consider allergy referral pending response.   - permethrin (ELIMITE) 5 % cream; Apply 1 application topically once for 1 dose. Consider repeat in 14 days after follow up with physician.  Dispense: 60 g; Refill: 1 - diphenhydrAMINE (BENADRYL) 50 MG tablet; Take 0.5-1 tablets (25-50 mg total) by mouth 2 (two) times daily as needed for itching.  Dispense: 30 tablet; Refill: 0  2. HTN: improved numbers. Continue checking at home. Would like to get readings in office that are improved as well when it is more safe for in office visits.   3. CKD stage 3: following with nephrology  4. DMII: following with endocrinology   I discussed the assessment and treatment plan with the patient. The patient was provided an opportunity to ask questions and all were answered. The patient agreed with the plan and demonstrated  an understanding of the instructions.   The patient was advised to call back or seek an in-person evaluation if the symptoms worsen or if the condition fails to improve as anticipated.  I provided 30 minutes of non-face-to-face time during this encounter.   [name], MD

## 2019-01-29 ENCOUNTER — Other Ambulatory Visit: Payer: Self-pay | Admitting: Family Medicine

## 2019-02-10 ENCOUNTER — Other Ambulatory Visit: Payer: Self-pay | Admitting: *Deleted

## 2019-02-10 ENCOUNTER — Telehealth: Payer: Self-pay | Admitting: *Deleted

## 2019-02-10 MED ORDER — INSULIN PEN NEEDLE 31G X 8 MM MISC: 3 refills | Status: DC

## 2019-02-10 MED ORDER — "TRUEPLUS INSULIN SYRINGE 31G X 5/16"" 0.3 ML MISC": 3 refills | Status: DC

## 2019-02-10 NOTE — Telephone Encounter (Signed)
I called [facility] and spoke with Jarrett Soho.   I apologized that the syringes were called in and removed this from the list.  She stated the pt needs a Rx for Sure Comfort 31Gx5/16 pen needles and this was sent in.

## 2019-02-10 NOTE — Telephone Encounter (Signed)
Rx done. 

## 2019-02-10 NOTE — Telephone Encounter (Signed)
Copied from [place] [id number]. Topic: General - Other >> Feb 10, 2019  1:09 PM Keene Breath wrote: Reason for CRM: Pharmacy called to inform the doctor that they received a script for syringes for the patient, but the patient needs the Pen Needles.  Pharmacy wanted to know if it would be ok to change the prescription.  Please advise and call pharmacy to confirm at [phone]

## 2019-02-25 ENCOUNTER — Other Ambulatory Visit: Payer: Self-pay | Admitting: Family Medicine

## 2019-03-14 ENCOUNTER — Other Ambulatory Visit: Payer: Self-pay | Admitting: Family Medicine

## 2019-03-14 DIAGNOSIS — I10 Essential (primary) hypertension: Secondary | ICD-10-CM

## 2019-03-14 DIAGNOSIS — E785 Hyperlipidemia, unspecified: Secondary | ICD-10-CM

## 2019-03-14 DIAGNOSIS — N400 Enlarged prostate without lower urinary tract symptoms: Secondary | ICD-10-CM

## 2019-03-29 NOTE — Telephone Encounter (Signed)
Pharmacy calling to check status of refill request

## 2019-03-31 ENCOUNTER — Other Ambulatory Visit: Payer: Self-pay | Admitting: Family Medicine

## 2019-04-28 DIAGNOSIS — Z6825 Body mass index (BMI) 25.0-25.9, adult: Secondary | ICD-10-CM | POA: Diagnosis not present

## 2019-04-28 DIAGNOSIS — F17200 Nicotine dependence, unspecified, uncomplicated: Secondary | ICD-10-CM | POA: Diagnosis not present

## 2019-04-28 DIAGNOSIS — I10 Essential (primary) hypertension: Secondary | ICD-10-CM | POA: Diagnosis not present

## 2019-04-28 DIAGNOSIS — J449 Chronic obstructive pulmonary disease, unspecified: Secondary | ICD-10-CM | POA: Diagnosis not present

## 2019-05-09 DIAGNOSIS — Z79891 Long term (current) use of opiate analgesic: Secondary | ICD-10-CM | POA: Diagnosis not present

## 2019-05-09 DIAGNOSIS — G894 Chronic pain syndrome: Secondary | ICD-10-CM | POA: Diagnosis not present

## 2019-05-09 DIAGNOSIS — M25569 Pain in unspecified knee: Secondary | ICD-10-CM | POA: Diagnosis not present

## 2019-05-09 DIAGNOSIS — M25519 Pain in unspecified shoulder: Secondary | ICD-10-CM | POA: Diagnosis not present

## 2019-05-09 DIAGNOSIS — I69851 Hemiplegia and hemiparesis following other cerebrovascular disease affecting right dominant side: Secondary | ICD-10-CM | POA: Diagnosis not present

## 2019-06-02 DIAGNOSIS — Z0289 Encounter for other administrative examinations: Secondary | ICD-10-CM | POA: Diagnosis not present

## 2019-06-08 DIAGNOSIS — G894 Chronic pain syndrome: Secondary | ICD-10-CM | POA: Diagnosis not present

## 2019-06-08 DIAGNOSIS — M25569 Pain in unspecified knee: Secondary | ICD-10-CM | POA: Diagnosis not present

## 2019-06-08 DIAGNOSIS — Z79891 Long term (current) use of opiate analgesic: Secondary | ICD-10-CM | POA: Diagnosis not present

## 2019-06-08 DIAGNOSIS — M25519 Pain in unspecified shoulder: Secondary | ICD-10-CM | POA: Diagnosis not present

## 2019-06-08 DIAGNOSIS — I69851 Hemiplegia and hemiparesis following other cerebrovascular disease affecting right dominant side: Secondary | ICD-10-CM | POA: Diagnosis not present

## 2019-06-10 ENCOUNTER — Other Ambulatory Visit: Payer: Self-pay | Admitting: Family Medicine

## 2019-06-20 ENCOUNTER — Ambulatory Visit: Payer: Self-pay | Admitting: *Deleted

## 2019-06-20 NOTE — Telephone Encounter (Signed)
Patient with hx of stroke that affected left side. He calls with left arm, leg and foot edema that feels tight. No calf pain, no warmth or redness to the extremities. Denies SOB/CP/dizziness/rashes. Reports the right leg and foot has edema also. Has voided 2-3 times today only. Has not been elevating extremities at all. No b/p readings. Encouraged to elevate arm and leg with several pillows, several times this evening. Any SOB/CP noted to call 911 immediately. Stated he understood. Office closed. Informed patient a nurse will call him between 8-9am for appointment for tomorrow. No fever/contacts/travel.   Reason for Disposition . [1] MODERATE leg swelling (e.g., swelling extends up to knees) AND [2] new onset or worsening  Answer Assessment - Initial Assessment Questions 1. ONSET: "When did the swelling start?" (e.g., minutes, hours, days)     About 2 weeks ago 2. LOCATION: "What part of the leg is swollen?"  "Are both legs swollen or just one leg?"     Actually, right leg and foot and the right arm.  All these areas affected by a stroke.  3. SEVERITY: "How bad is the swelling?" (e.g., localized; mild, moderate, severe)  - Localized - small area of swelling localized to one leg  - MILD pedal edema - swelling limited to foot and ankle, pitting edema < 1/4 inch (6 mm) deep, rest and elevation eliminate most or all swelling  - MODERATE edema - swelling of lower leg to knee, pitting edema > 1/4 inch (6 mm) deep, rest and elevation only partially reduce swelling  - SEVERE edema - swelling extends above knee, facial or hand swelling present      Moderate to severe. Leg swelling up the lower leg and feels tight. 4. REDNESS: "Does the swelling look red or infected?"     no 5. PAIN: "Is the swelling painful to touch?" If so, ask: "How painful is it?"   (Scale 1-10; mild, moderate or severe)     no 6. FEVER: "Do you have a fever?" If so, ask: "What is it, how was it measured, and when did it start?"   no 7. CAUSE: "What do you think is causing the leg swelling?"     Unsure.8. MEDICAL HISTORY: "Do you have a history of heart failure, kidney disease, liver failure, or cancer?"     Says no 9. RECURRENT SYMPTOM: "Have you had leg swelling before?" If so, ask: "When was the last time?" "What happened that time?"     Possibly in the past.10. OTHER SYMPTOMS: "Do you have any other symptoms?" (e.g., chest pain, difficulty breathing)       Denies all. 11. PREGNANCY: "Is there any chance you are pregnant?" "When was your last menstrual period?"       na  Protocols used: LEG SWELLING AND EDEMA-A-AH

## 2019-06-21 NOTE — Telephone Encounter (Signed)
Please call pt to schedule

## 2019-06-21 NOTE — Telephone Encounter (Signed)
Attempted to call patient 2 times-- LVM for patient to call back and schedule an appointment.

## 2019-06-22 ENCOUNTER — Ambulatory Visit (INDEPENDENT_AMBULATORY_CARE_PROVIDER_SITE_OTHER): Payer: Medicare HMO | Admitting: Family Medicine

## 2019-06-22 ENCOUNTER — Ambulatory Visit (INDEPENDENT_AMBULATORY_CARE_PROVIDER_SITE_OTHER): Payer: Medicare HMO

## 2019-06-22 ENCOUNTER — Other Ambulatory Visit: Payer: Self-pay

## 2019-06-22 ENCOUNTER — Ambulatory Visit: Payer: Medicare HMO | Admitting: Family Medicine

## 2019-06-22 ENCOUNTER — Encounter: Payer: Self-pay | Admitting: Family Medicine

## 2019-06-22 VITALS — BP 140/80 | HR 89 | Temp 97.8°F | Wt 182.1 lb

## 2019-06-22 DIAGNOSIS — R634 Abnormal weight loss: Secondary | ICD-10-CM

## 2019-06-22 DIAGNOSIS — I10 Essential (primary) hypertension: Secondary | ICD-10-CM

## 2019-06-22 DIAGNOSIS — R600 Localized edema: Secondary | ICD-10-CM

## 2019-06-22 DIAGNOSIS — J90 Pleural effusion, not elsewhere classified: Secondary | ICD-10-CM | POA: Diagnosis not present

## 2019-06-22 MED ORDER — FUROSEMIDE 40 MG PO TABS: 40.0000 mg | ORAL_TABLET | Freq: Every day | ORAL | 3 refills | Status: DC

## 2019-06-22 NOTE — Patient Instructions (Addendum)
Start the furosemide 40 mg once daily  I want to set up venous doppler of upper extremity   Set up follow up with Dr Ethlyn Gallery in one week.

## 2019-06-22 NOTE — Progress Notes (Addendum)
Subjective:     Patient ID: Richard [name], male   DOB: 24-Nov-1955, 63 y.o.   MRN: [id number]  HPI   Patient has multiple chronic problems including history of CVA with right hemiparesis, hypertension, chronic kidney disease, type 2 diabetes, ongoing nicotine use.  He is seen today with bilateral leg edema worsening over apparently several weeks as well as right upper extremity swelling.    He states he has had swelling of the right upper extremity once before but not to this extent.  He denies any chest pain.  No dyspnea.  No chronic cough.  His weight is actually down 14 pounds compared to last February.  Denies any history of heart failure.  He had echocardiogram 2013 with ejection fraction 55 to 60%.  Denies history of DVT  Does take amlodipine 10 mg daily for hypertension as well as several other medications.  Smokes 1/2 pack cigarettes per day.  No pleuritic pain.  He apparently has seen nephrologist in the past for his chronic kidney disease with creatinine around 2.5.  He has been lost to follow-up over the past few months with the pandemic  Past Medical History:  Diagnosis Date  . Arthritis   . Diabetes mellitus, type II, insulin dependent ([place])   . GERD (gastroesophageal reflux disease)   . Hypertension   . Migraines   . Neuropathy in diabetes ([place])    bilat feet  . Ruptured lumbar disc   . Stroke [facility]) 2013   residual right sided weakness (arm>leg)   Past Surgical History:  Procedure Laterality Date  . KNEE ARTHROSCOPY Bilateral     reports that he has been smoking cigarettes. He has been smoking about 0.50 packs per day. He has never used smokeless tobacco. He reports current drug use. Drug: Marijuana. He reports that he does not drink alcohol. family history includes Breast cancer in his sister; Diabetes in his sister; Epilepsy in his father; Heart attack in his brother; Hypertension in his father; Other in his mother; Stroke in his brother and father. No Known  Allergies   Review of Systems  Constitutional: Negative for chills, fatigue and fever.  Eyes: Negative for visual disturbance.  Respiratory: Negative for cough, chest tightness and shortness of breath.   Cardiovascular: Positive for leg swelling. Negative for chest pain and palpitations.  Neurological: Negative for dizziness, syncope, weakness, light-headedness and headaches.       Objective:   Physical Exam Constitutional:      Appearance: Normal appearance.  Neck:     Comments: No JVD.  No visible neck edema.   Cardiovascular:     Rate and Rhythm: Normal rate and regular rhythm.  Pulmonary:     Comments: Somewhat diminished breath sounds throughout.  No wheezes.  No rales. Musculoskeletal:     Comments: He has 2+ pitting edema lower extremities right greater than left.  He also has significant pitting edema of the right forearm and arm.  Right hand is warm to touch with good capillary refill  Lymphadenopathy:     Cervical: No cervical adenopathy.  Neurological:     Mental Status: He is alert.        Assessment:     #1 bilateral leg edema worsening over the past few weeks.  No history of known systolic heart failure.  #2 fairly acute onset of right upper extremity swelling 1 week ago.  No evidence for cellulitis.  No hx of injury. Rule out DVT.    #3 weight loss-uncertain etiology  #  4 chronic kidney disease  #5 type 2 diabetes treated with insulin  #6 ongoing nicotine use    Plan:     -Obtain further labs with comprehensive metabolic panel, CBC, TSH, BNP  -PA and lateral chest x-ray to evaluate because of his increasing edema and right upper extremity swelling  -set up venous doppler right upper extremity.  -Furosemide 40 mg daily  -elevate legs frequently.  -recommend follow up with Dr Ethlyn Gallery in one week.  Eulas Post MD [facility]  06/24/2019 addendum-spoke with patient.  He did see his nephrologist yesterday.  They put  him on some potassium replacement and also increased his Lasix.  He has not yet gotten potassium filled at pharmacy and we have strongly advised him to start that today and not delay.  His Doppler is scheduled for early next week  Eulas Post MD [facility]

## 2019-06-23 ENCOUNTER — Telehealth (INDEPENDENT_AMBULATORY_CARE_PROVIDER_SITE_OTHER): Payer: Medicare HMO | Admitting: Family Medicine

## 2019-06-23 ENCOUNTER — Encounter: Payer: Self-pay | Admitting: Family Medicine

## 2019-06-23 ENCOUNTER — Telehealth: Payer: Self-pay | Admitting: *Deleted

## 2019-06-23 DIAGNOSIS — R609 Edema, unspecified: Secondary | ICD-10-CM

## 2019-06-23 DIAGNOSIS — E876 Hypokalemia: Secondary | ICD-10-CM

## 2019-06-23 DIAGNOSIS — N189 Chronic kidney disease, unspecified: Secondary | ICD-10-CM

## 2019-06-23 LAB — CBC WITH DIFFERENTIAL/PLATELET
Basophils Absolute: 0 10*3/uL (ref 0.0–0.1)
Basophils Relative: 1 % (ref 0.0–3.0)
Eosinophils Absolute: 0.1 10*3/uL (ref 0.0–0.7)
Eosinophils Relative: 3.2 % (ref 0.0–5.0)
HCT: 32.1 % — ABNORMAL LOW (ref 39.0–52.0)
Hemoglobin: 10.6 g/dL — ABNORMAL LOW (ref 13.0–17.0)
Lymphocytes Relative: 33.3 % (ref 12.0–46.0)
Lymphs Abs: 1.5 10*3/uL (ref 0.7–4.0)
MCHC: 33.2 g/dL (ref 30.0–36.0)
MCV: 91.1 fl (ref 78.0–100.0)
Monocytes Absolute: 0.3 10*3/uL (ref 0.1–1.0)
Monocytes Relative: 7.5 % (ref 3.0–12.0)
Neutro Abs: 2.4 10*3/uL (ref 1.4–7.7)
Neutrophils Relative %: 55 % (ref 43.0–77.0)
Platelets: 224 10*3/uL (ref 150.0–400.0)
RBC: 3.52 Mil/uL — ABNORMAL LOW (ref 4.22–5.81)
RDW: 15.2 % (ref 11.5–15.5)
WBC: 4.4 10*3/uL (ref 4.0–10.5)

## 2019-06-23 LAB — COMPREHENSIVE METABOLIC PANEL
ALT: 8 U/L (ref 0–53)
AST: 14 U/L (ref 0–37)
Albumin: 2.3 g/dL — ABNORMAL LOW (ref 3.5–5.2)
Alkaline Phosphatase: 121 U/L — ABNORMAL HIGH (ref 39–117)
BUN: 33 mg/dL — ABNORMAL HIGH (ref 6–23)
CO2: 24 mEq/L (ref 19–32)
Calcium: 7 mg/dL — ABNORMAL LOW (ref 8.4–10.5)
Chloride: 104 mEq/L (ref 96–112)
Creatinine, Ser: 6.09 mg/dL (ref 0.40–1.50)
GFR: 11.4 mL/min — CL (ref >60.00)
Glucose, Bld: 114 mg/dL — ABNORMAL HIGH (ref 70–99)
Potassium: 2.5 mEq/L — CL (ref 3.5–5.1)
Sodium: 140 mEq/L (ref 135–145)
Total Bilirubin: 0.4 mg/dL (ref 0.2–1.2)
Total Protein: 5.4 g/dL — ABNORMAL LOW (ref 6.0–8.3)

## 2019-06-23 LAB — BRAIN NATRIURETIC PEPTIDE: Pro B Natriuretic peptide (BNP): 170 pg/mL — ABNORMAL HIGH (ref 0.0–100.0)

## 2019-06-23 LAB — TSH: TSH: 0.97 u[IU]/mL (ref 0.35–4.50)

## 2019-06-23 NOTE — Telephone Encounter (Signed)
Received a call from [facility] lab. Patient's Potassium is 2.5, Creatnine 6.09, and GFR 11.39. Please advise

## 2019-06-23 NOTE — Progress Notes (Signed)
Virtual Visit via Telemedicine visit:  I connected with Richard [name] on 06/23/19 at  3:20 PM EDT by telephone and verified that I am speaking with the correct person using two identifiers. He tried but failed to connect by video so visit complete in audio alone.   I discussed the limitations, risks, security and privacy concerns of performing an evaluation and management service by telephone and the availability of in person appointments. I also discussed with the patient that there may be a patient responsible charge related to this service. The patient expressed understanding and agreed to proceed.  Location patient: home Location provider: work or home office Participants present for the call: patient, provider Patient did not have a visit in the prior 7 days to address this/these issue(s).   History of Present Illness:   Acute video visit for hypokalemia, follow up edema. Seen yesterday by Dr. [name] for increasing edema in bilat LEs, R arm edema. He had labs which showed low potassium and elevated creatinine. Today reports no different. Reports swelling remains without redness or pain. Denies fevers, malaise, SOB, dizziness, lethargy, CP or any other concerns. Reports saw his kidney specialist a few months prior to the pandemic, but does not recall the name to the location of his nephrologist.   Observations/Objective: Patient sounds cheerful and well on the phone. I do not appreciate any SOB. Speech and thought processing are grossly intact. Patient reported vitals:  Assessment and Plan:  AoCKD Hypokalemia Edema of arm LE edema  Discussed potential etiologies for the edema and he is undergoing evaluation. Possibly related to what appears to be acute on chronic renal failure. Patient prefers to avoid the hospital and denies any symptoms today except for the swelling. Advised RN to call [facility] to see if he is a patient there to discuss case with them to see if  they can get him in promptly and otherwise would send to hospital if they are not able to see him today or tomorrow. Already took lasix today. Hold if seeing renal tomorrow unless instructed otherwise. Also increased potassium rich foods today.  Addendum: Pt has appointment with [facility] tomorrow morning. I called pt back to personally confirm his appt details with him. He was appreciative. Will forward notes to PCP as well.   Follow Up Instructions: Referral to Nephrology, see above PCP follow up in 1-2 weeks  I discussed the assessment and treatment plan with the patient. The patient was provided an opportunity to ask questions and all were answered. The patient agreed with the plan and demonstrated an understanding of the instructions.   The patient was advised to call back or seek an in-person evaluation if the symptoms worsen or if the condition fails to improve as anticipated.  I provided 15 minutes of non-face-to-face time during this encounter.   Richard Kern, DO

## 2019-06-23 NOTE — Telephone Encounter (Signed)
Patient took Lasix this morning, patient denies difficulty breathing and malaise. Vitual visit scheduled for 3:20 PM

## 2019-06-23 NOTE — Telephone Encounter (Signed)
Patient was seen on 06/22/19 by Dr. [name].

## 2019-06-23 NOTE — Telephone Encounter (Signed)
Patient returning call from office. Please advise as he has appointment at 3:20.

## 2019-06-23 NOTE — Telephone Encounter (Signed)
Per Dr. Maudie Mercury patient needs to f/u  With Kidney specialist. Clinic RN called [facility] and patient has an appointment tomorrow morning at [facility] and needs to bring insurance card.   Clinic RN communicated this information with patient and he verbalized understanding.

## 2019-06-23 NOTE — Telephone Encounter (Signed)
Richard [name],  Can you reach out to him and see how he is feeling? If feeling bad, any worse, difficulty breathing, malaise please sent to ER now. IF doing ok, please see what time I can reach out to him in the next few hours to assess and treat via video or tele visit. Tell him not to take lasix if has not taken yet today.  Thanks!

## 2019-06-24 ENCOUNTER — Ambulatory Visit
Admission: RE | Admit: 2019-06-24 | Discharge: 2019-06-24 | Disposition: A | Discharge status: Home / Self Care | Payer: Medicare HMO | Source: Ambulatory Visit | Attending: Nephrology | Admitting: Nephrology

## 2019-06-24 ENCOUNTER — Other Ambulatory Visit: Payer: Self-pay | Admitting: Nephrology

## 2019-06-24 DIAGNOSIS — N281 Cyst of kidney, acquired: Secondary | ICD-10-CM | POA: Diagnosis not present

## 2019-06-24 DIAGNOSIS — D631 Anemia in chronic kidney disease: Secondary | ICD-10-CM | POA: Diagnosis not present

## 2019-06-24 DIAGNOSIS — N2581 Secondary hyperparathyroidism of renal origin: Secondary | ICD-10-CM | POA: Diagnosis not present

## 2019-06-24 DIAGNOSIS — N184 Chronic kidney disease, stage 4 (severe): Secondary | ICD-10-CM

## 2019-06-24 DIAGNOSIS — E876 Hypokalemia: Secondary | ICD-10-CM | POA: Diagnosis not present

## 2019-06-24 DIAGNOSIS — R609 Edema, unspecified: Secondary | ICD-10-CM | POA: Diagnosis not present

## 2019-06-24 DIAGNOSIS — N189 Chronic kidney disease, unspecified: Secondary | ICD-10-CM | POA: Diagnosis not present

## 2019-06-24 DIAGNOSIS — I129 Hypertensive chronic kidney disease with stage 1 through stage 4 chronic kidney disease, or unspecified chronic kidney disease: Secondary | ICD-10-CM | POA: Diagnosis not present

## 2019-06-29 ENCOUNTER — Other Ambulatory Visit: Payer: Self-pay

## 2019-06-29 ENCOUNTER — Ambulatory Visit (HOSPITAL_COMMUNITY)
Admission: RE | Admit: 2019-06-29 | Discharge: 2019-06-29 | Disposition: A | Discharge status: Home / Self Care | Payer: Medicare HMO | Source: Ambulatory Visit | Attending: Cardiology | Admitting: Cardiology

## 2019-06-29 DIAGNOSIS — R600 Localized edema: Secondary | ICD-10-CM | POA: Insufficient documentation

## 2019-07-06 ENCOUNTER — Other Ambulatory Visit: Payer: Self-pay | Admitting: *Deleted

## 2019-07-06 DIAGNOSIS — M25569 Pain in unspecified knee: Secondary | ICD-10-CM | POA: Diagnosis not present

## 2019-07-06 DIAGNOSIS — I10 Essential (primary) hypertension: Secondary | ICD-10-CM

## 2019-07-06 DIAGNOSIS — I69851 Hemiplegia and hemiparesis following other cerebrovascular disease affecting right dominant side: Secondary | ICD-10-CM | POA: Diagnosis not present

## 2019-07-06 DIAGNOSIS — Z79891 Long term (current) use of opiate analgesic: Secondary | ICD-10-CM | POA: Diagnosis not present

## 2019-07-06 DIAGNOSIS — M25519 Pain in unspecified shoulder: Secondary | ICD-10-CM | POA: Diagnosis not present

## 2019-07-06 DIAGNOSIS — G894 Chronic pain syndrome: Secondary | ICD-10-CM | POA: Diagnosis not present

## 2019-07-06 MED ORDER — AMLODIPINE BESYLATE 10 MG PO TABS: 10.0000 mg | ORAL_TABLET | Freq: Every day | ORAL | 1 refills | Status: DC

## 2019-07-06 NOTE — Telephone Encounter (Signed)
Rx done. 

## 2019-07-08 ENCOUNTER — Telehealth: Payer: Self-pay | Admitting: *Deleted

## 2019-07-08 ENCOUNTER — Telehealth (INDEPENDENT_AMBULATORY_CARE_PROVIDER_SITE_OTHER): Payer: Medicare HMO | Admitting: Family Medicine

## 2019-07-08 ENCOUNTER — Other Ambulatory Visit: Payer: Self-pay | Admitting: Family Medicine

## 2019-07-08 ENCOUNTER — Other Ambulatory Visit: Payer: Self-pay

## 2019-07-08 DIAGNOSIS — E876 Hypokalemia: Secondary | ICD-10-CM | POA: Diagnosis not present

## 2019-07-08 DIAGNOSIS — N179 Acute kidney failure, unspecified: Secondary | ICD-10-CM

## 2019-07-08 DIAGNOSIS — I10 Essential (primary) hypertension: Secondary | ICD-10-CM | POA: Diagnosis not present

## 2019-07-08 DIAGNOSIS — R2231 Localized swelling, mass and lump, right upper limb: Secondary | ICD-10-CM | POA: Diagnosis not present

## 2019-07-08 DIAGNOSIS — Z794 Long term (current) use of insulin: Secondary | ICD-10-CM

## 2019-07-08 DIAGNOSIS — R600 Localized edema: Secondary | ICD-10-CM

## 2019-07-08 DIAGNOSIS — E785 Hyperlipidemia, unspecified: Secondary | ICD-10-CM | POA: Diagnosis not present

## 2019-07-08 DIAGNOSIS — N184 Chronic kidney disease, stage 4 (severe): Secondary | ICD-10-CM | POA: Diagnosis not present

## 2019-07-08 DIAGNOSIS — E1122 Type 2 diabetes mellitus with diabetic chronic kidney disease: Secondary | ICD-10-CM | POA: Diagnosis not present

## 2019-07-08 NOTE — Progress Notes (Signed)
Virtual Visit via Video Note  I connected with Richard [name] on 07/08/19 at 8:30am for 8 am appointment by a video enabled telemedicine application and verified that I am speaking with the correct person using two identifiers.  Location patient: home Location provider:work office Persons participating in the virtual visit: patient, provider  I discussed the limitations of evaluation and management by telemedicine and the availability of in person appointments. The patient expressed understanding and agreed to proceed.  Richard [name] DOB: 1956-02-23 Encounter date: 07/08/2019  This is a 63 y.o. male who presents with No chief complaint on file.   History of present illness: Was seen on September 30 by Dr. [name] for increased right upper extremity swelling as well as bilateral lower extremity swelling.  Lab work, chest x-ray, and right upper extremity Doppler ordered.  Lasix daily was ordered.  Follow-up the following day with Dr. Maudie Mercury due to low potassium and elevated creatinine.  She was able to ensure that he had a follow-up appointment the following day with [facility] symptoms are thought to be related to possible acute on chronic renal failure.  Of note, upper extremity ultrasound did not reveal any thrombus in deep or superficial veins.  Chest x-ray from 9/30 showed no acute cardiopulmonary disease.  Renal ultrasound obtained on 10/2 showed stable increased renal parenchymal echogenicity consistent with medical renal disease.  Swelling in arm is going down. Right foot is still swollen. When he stands on foot to walk; feels like "skin is coming off bones". Foot is sore. Notes this after sitting and then going to standing or when he gets up in morning. Arm is better overall. Left foot swelling has improved as well. Once he is up and walking on right foot then it feels a little better. This pain of foot is new for him.   When he saw kidney doctor he was given potassium  pills, followed up with Richard [name]. Has appointment next month (he isn't sure about this because he doesn't have calendar with him so might be sooner). He states that he did have bloodwork with nephrology as well.   Blood pressures have been doing ok: last check was 144/80.    No Known Allergies No outpatient medications have been marked as taking for the 07/08/19 encounter (Appointment) with [name], MD.    Review of Systems  Constitutional: Negative for chills, fatigue and fever.  Respiratory: Negative for cough, chest tightness, shortness of breath and wheezing.   Cardiovascular: Positive for leg swelling (improving). Negative for chest pain and palpitations.    Objective:  There were no vitals taken for this visit.      BP Readings from Last 3 Encounters:  06/22/19 140/80  11/01/18 (!) 158/78  10/02/18 (!) 162/78   Wt Readings from Last 3 Encounters:  06/22/19 182 lb 1.6 oz (82.6 kg)  11/01/18 196 lb 9.6 oz (89.2 kg)  10/02/18 197 lb 15.6 oz (89.8 kg)    Physical Exam   VITALS per patient if applicable:  GENERAL: alert, oriented, appears well and in no acute distress  HEENT: atraumatic, conjunctiva clear, no obvious abnormalities on inspection of external nose and ears  NECK: normal movements of the head and neck  LUNGS: on inspection no signs of respiratory distress, breathing rate appears normal, no obvious gross SOB, gasping or wheezing  CV: no obvious cyanosis. He does have some swelling RLE: but none noted with LLE or RUE.   MS: moves all visible extremities without  noticeable abnormality  PSYCH/NEURO: pleasant and cooperative, no obvious depression or anxiety, speech and thought processing grossly intact   Assessment/Plan  1. Acute renal failure superimposed on stage 4 chronic kidney disease, unspecified acute renal failure type [facility]) He is following with nephrology.  I do not have any office notes, but he does have a follow-up visit  (uncertain of the date of this, but is in his calendar in the other room).  He has been put on a potassium supplement.  He states they did discuss potential dialysis, but he is going to follow-up first and see how [name] respond.  2. Type 2 diabetes mellitus with stage 4 chronic kidney disease, with long-term current use of insulin [facility]) He is following with endocrinology.  3. Hyperlipidemia, unspecified hyperlipidemia type On pravastatin 80 mg.  4. Essential hypertension Has been stable.  He does state he is checking at home.  I am not going to make any adjustments since he is seeing nephrology at this point.  Keep a close eye on pressures.  5. Hypokalemia Managed by nephrology.  He is currently on potassium supplement.  6. Localized swelling of right upper extremity This has resolved.    I discussed the assessment and treatment plan with the patient. The patient was provided an opportunity to ask questions and all were answered. The patient agreed with the plan and demonstrated an understanding of the instructions.   The patient was advised to call back or seek an in-person evaluation if the symptoms worsen or if the condition fails to improve as anticipated.  I provided 18 minutes of non-face-to-face time during this encounter. We did get disconnected prior to signing off visit, I will have my medical assistant call him back to schedule follow-up.  He checked in for a visit over 20 minutes late today so we did not have time to address all of his chronic conditions, but will cover these at his next visit.  In the meanwhile he will follow up with nephrology.  They have been following his hypokalemia and patient has had blood work that I am not able to see at this point.   [name], MD

## 2019-07-08 NOTE — Telephone Encounter (Signed)
Please advise. I do not see any cream sent in today.

## 2019-07-08 NOTE — Telephone Encounter (Signed)
When we discussed leg pain earlier, I thought he described more neuropathy. We discussed further by phone, and he has more tightness/pain associated with initial weight bearing and feels related to edema.   We discussed edema mgmt. I sent in rx for compression stockings (he's going to price check at his pharmacy). Elevation. Limiting sodium.   He will schedule follow up with his podiatrist.  Mechele Claude- I couldn't remember name of place you suggest for compression stockings but told him you would call and let him know in case [facility] was too $$.

## 2019-07-08 NOTE — Telephone Encounter (Signed)
Copied from [place] [id number]. Topic: General - Other >> Jul 08, 2019  3:49 PM Keene Breath wrote: Reason for CRM: Patient called to ask if the doctor had sent in a prescription for a cream.  Please call to verify the prescription was sent.  CB# [phone]

## 2019-07-08 NOTE — Progress Notes (Signed)
Appointment has been scheduled. Nothing further needed. 

## 2019-07-11 NOTE — Telephone Encounter (Signed)
I called the pt and informed him to go to [employer] to check for pricing on [employer] brand compression hose and he agreed.

## 2019-07-27 ENCOUNTER — Other Ambulatory Visit: Payer: Self-pay | Admitting: Family Medicine

## 2019-07-27 DIAGNOSIS — I10 Essential (primary) hypertension: Secondary | ICD-10-CM

## 2019-08-03 DIAGNOSIS — Z79891 Long term (current) use of opiate analgesic: Secondary | ICD-10-CM | POA: Diagnosis not present

## 2019-08-03 DIAGNOSIS — I69851 Hemiplegia and hemiparesis following other cerebrovascular disease affecting right dominant side: Secondary | ICD-10-CM | POA: Diagnosis not present

## 2019-08-03 DIAGNOSIS — M25519 Pain in unspecified shoulder: Secondary | ICD-10-CM | POA: Diagnosis not present

## 2019-08-03 DIAGNOSIS — M25569 Pain in unspecified knee: Secondary | ICD-10-CM | POA: Diagnosis not present

## 2019-08-03 DIAGNOSIS — G894 Chronic pain syndrome: Secondary | ICD-10-CM | POA: Diagnosis not present

## 2019-08-09 ENCOUNTER — Telehealth: Payer: Self-pay | Admitting: *Deleted

## 2019-08-09 NOTE — Telephone Encounter (Signed)
Copied from [place] [id number]. Topic: General - Inquiry >> Aug 09, 2019  1:35 PM Richardo Priest, [place] wrote: Reason for CRM: Pt called in stating he would like for his acid reflux medication to be changed to twice daily as it is bad. Please advise.

## 2019-08-10 MED ORDER — FAMOTIDINE 20 MG PO TABS: 20.0000 mg | ORAL_TABLET | Freq: Two times a day (BID) | ORAL | 0 refills | Status: DC

## 2019-08-10 NOTE — Telephone Encounter (Signed)
He is already taking a high dose of protonix. I think it would be best to have a visit to discuss if it is that bad. I would encourage him to take the protonix 30 minutes prior to dinner, make sure meals are smaller. Limit spicy, citrus foods, greasy foods. No laying down for 2 hours after eating. Avoid caffeine. He could add pepcid OTC if he would like. We could send this for him (20mg  BID) to try for a couple of weeks, but if not better he needs to be seen.   If he has ever been seen by GI for this in the past, it would be ok to refer back to them to treat.

## 2019-08-10 NOTE — Telephone Encounter (Signed)
Spoke with the pt and informed him of the message below.  Patient agreed to begin Pepcid and is aware the Rx was sent to his pharmacy.  I also advised the pt a year's supply for Protonix was sent to his pharmacy in July so he should have refills on file.  Patient stated he ran out because he was taking this twice a day and I advised him of prior instructions per Dr Ethlyn Gallery.

## 2019-08-25 ENCOUNTER — Other Ambulatory Visit: Payer: Self-pay | Admitting: Family Medicine

## 2019-08-25 DIAGNOSIS — L309 Dermatitis, unspecified: Secondary | ICD-10-CM

## 2019-08-25 MED ORDER — TRIAMCINOLONE ACETONIDE 0.1 % EX CREA: TOPICAL_CREAM | CUTANEOUS | 0 refills | Status: DC

## 2019-08-25 NOTE — Telephone Encounter (Signed)
See request °

## 2019-08-25 NOTE — Telephone Encounter (Signed)
rx refill triamcinolone cream (KENALOG) 0.1 %  PHARMACY [facility] - [place], [place] - [address] [phone] (Phone) [phone] (Fax)

## 2019-08-25 NOTE — Telephone Encounter (Signed)
Rx sent 

## 2019-08-31 DIAGNOSIS — G894 Chronic pain syndrome: Secondary | ICD-10-CM | POA: Diagnosis not present

## 2019-08-31 DIAGNOSIS — Z79891 Long term (current) use of opiate analgesic: Secondary | ICD-10-CM | POA: Diagnosis not present

## 2019-08-31 DIAGNOSIS — I69851 Hemiplegia and hemiparesis following other cerebrovascular disease affecting right dominant side: Secondary | ICD-10-CM | POA: Diagnosis not present

## 2019-08-31 DIAGNOSIS — M25569 Pain in unspecified knee: Secondary | ICD-10-CM | POA: Diagnosis not present

## 2019-08-31 DIAGNOSIS — M25519 Pain in unspecified shoulder: Secondary | ICD-10-CM | POA: Diagnosis not present

## 2019-09-02 ENCOUNTER — Other Ambulatory Visit: Payer: Self-pay | Admitting: Family Medicine

## 2019-09-02 DIAGNOSIS — N400 Enlarged prostate without lower urinary tract symptoms: Secondary | ICD-10-CM

## 2019-09-07 ENCOUNTER — Telehealth (INDEPENDENT_AMBULATORY_CARE_PROVIDER_SITE_OTHER): Payer: Medicare HMO | Admitting: Family Medicine

## 2019-09-07 ENCOUNTER — Other Ambulatory Visit: Payer: Self-pay

## 2019-09-07 ENCOUNTER — Telehealth: Payer: Self-pay | Admitting: *Deleted

## 2019-09-07 DIAGNOSIS — E785 Hyperlipidemia, unspecified: Secondary | ICD-10-CM

## 2019-09-07 DIAGNOSIS — M79671 Pain in right foot: Secondary | ICD-10-CM

## 2019-09-07 DIAGNOSIS — K219 Gastro-esophageal reflux disease without esophagitis: Secondary | ICD-10-CM

## 2019-09-07 DIAGNOSIS — E1122 Type 2 diabetes mellitus with diabetic chronic kidney disease: Secondary | ICD-10-CM

## 2019-09-07 DIAGNOSIS — I10 Essential (primary) hypertension: Secondary | ICD-10-CM | POA: Diagnosis not present

## 2019-09-07 DIAGNOSIS — N183 Chronic kidney disease, stage 3 unspecified: Secondary | ICD-10-CM

## 2019-09-07 DIAGNOSIS — N184 Chronic kidney disease, stage 4 (severe): Secondary | ICD-10-CM

## 2019-09-07 DIAGNOSIS — Z794 Long term (current) use of insulin: Secondary | ICD-10-CM

## 2019-09-07 DIAGNOSIS — M79604 Pain in right leg: Secondary | ICD-10-CM

## 2019-09-07 NOTE — Telephone Encounter (Signed)
-----   Message from [name], MD sent at 09/07/2019 10:52 AM EST ----- Please set up an in office visit to evaluate his leg pain and swelling. Would prefer 30 minutes.

## 2019-09-07 NOTE — Telephone Encounter (Signed)
Spoke with the pt and scheduled an appt for 12/23 to arrive at 1:15pm.

## 2019-09-07 NOTE — Progress Notes (Signed)
Virtual Visit via Video Note: Patient was unable to connect via video, so visit was completed via telephone.  I connected with Richard [name]  on 09/07/19 at 10:00 AM EST by a video enabled telemedicine application and verified that I am speaking with the correct person using two identifiers.  Location patient: home Location provider:work or home office Persons participating in the virtual visit: patient, provider  I discussed the limitations of evaluation and management by telemedicine and the availability of in person appointments. The patient expressed understanding and agreed to proceed.   Richard [name] DOB: [birth year] Encounter date: 09/07/2019  This is a 63 y.o. male who presents with No chief complaint on file.   History of present illness: Last visit 2 months ago was follow up for acute arm swelling, foot swelling. He was following up with [facility] as it was thought that renal status was related to swelling. States that last time he saw kidney doc was 1-2 months ago. Thinks that he has seen them 2-3 times since our last visit.   Can hardly walk on right foot and right leg. Bothers him when he puts pressure on foot. Swelling comes and goes. Hurts to walk on even when swelling is gone. No skin color change. Can't really tell where pain is coming from. After he is up and walking on leg for awhile then he doesn't feel it as much. Pain is in back of leg; like in calf. No fevers, chills. Leg feels warm. No blisters or sores at all. Doesn't feel like it is getting worse; feels like it is about the same.  Blood sugars have been "pretty good". Checking twice daily. In morning getting around 140's and higher (sometimes in 200's) for this.    Swelling in right arm has improved. Doesn't have recent weight on himself.   No trouble with breathing at all.   GERD: feels like the pepcid doesn't always work. Notes that he can have more severe reflux after certain foods, like  chicken livers.   HTN: doesn't check regularly at home.   No Known Allergies No outpatient medications have been marked as taking for the 09/07/19 encounter (Telemedicine) with [name], MD.    Review of Systems  Constitutional: Negative for chills, fatigue and fever.  Respiratory: Negative for cough, chest tightness, shortness of breath and wheezing.   Cardiovascular: Negative for chest pain, palpitations and leg swelling.    Objective:  There were no vitals taken for this visit.      BP Readings from Last 3 Encounters:  06/22/19 140/80  11/01/18 (!) 158/78  10/02/18 (!) 162/78   Wt Readings from Last 3 Encounters:  06/22/19 182 lb 1.6 oz (82.6 kg)  11/01/18 196 lb 9.6 oz (89.2 kg)  10/02/18 197 lb 15.6 oz (89.8 kg)    EXAM:  GENERAL: alert, oriented, appears well and in no acute distress  HEENT: atraumatic, conjunctiva clear, no obvious abnormalities on inspection of external nose and ears  NECK: normal movements of the head and neck  LUNGS: on inspection no signs of respiratory distress, breathing rate appears normal, no obvious gross SOB, gasping or wheezing  CV: no obvious cyanosis  MS: moves all visible extremities without noticeable abnormality  PSYCH/NEURO: pleasant and cooperative, no obvious depression or anxiety, speech and thought processing grossly intact  Assessment/Plan 1. Essential hypertension Uncertain control as patient's not checking regularly at home.  2. Stage 3 chronic renal impairment associated with type 2 diabetes mellitus ([place]) He  is following with nephrology.  He is uncertain of any current treatment plan, so when he is in office next we will have him sign a record release to get notes from his visit since I am unable to see these.  3. Hyperlipidemia, unspecified hyperlipidemia type On pravastatin.  4. Type 2 diabetes mellitus with stage 4 chronic kidney disease, with long-term current use of insulin ([place]) States it has  been well controlled.  Uncertain of last endocrinology visit.  5. Gastroesophageal reflux disease, unspecified whether esophagitis present Suboptimal control.  Suggested taking Protonix prior to dinner and taking Pepcid in the morning and evening.  Limit aggravating foods and drinks.  6. Right leg pain Uncertain etiology of pain.  We will have him follow-up for an inpatient visit to further assess.  I did recommend if he notes any skin changes, worsening swelling or pain, or temperature changes in the meanwhile he needs to be seen right away.  7. Right foot pain See above.  Return for wil schedule in office visit for evaluation of leg.    I discussed the assessment and treatment plan with the patient. The patient was provided an opportunity to ask questions and all were answered. The patient agreed with the plan and demonstrated an understanding of the instructions.   The patient was advised to call back or seek an in-person evaluation if the symptoms worsen or if the condition fails to improve as anticipated.  I provided 20 minutes of non-face-to-face time during this encounter.   [name], MD

## 2019-09-14 ENCOUNTER — Ambulatory Visit (INDEPENDENT_AMBULATORY_CARE_PROVIDER_SITE_OTHER): Payer: Medicare HMO | Admitting: Family Medicine

## 2019-09-14 ENCOUNTER — Telehealth: Payer: Medicare HMO | Admitting: Family Medicine

## 2019-09-14 ENCOUNTER — Other Ambulatory Visit: Payer: Self-pay

## 2019-09-14 ENCOUNTER — Encounter: Payer: Self-pay | Admitting: Family Medicine

## 2019-09-14 ENCOUNTER — Telehealth: Payer: Self-pay | Admitting: Family Medicine

## 2019-09-14 VITALS — BP 162/92 | HR 93 | Temp 97.8°F | Ht 72.0 in | Wt 164.6 lb

## 2019-09-14 DIAGNOSIS — E1142 Type 2 diabetes mellitus with diabetic polyneuropathy: Secondary | ICD-10-CM

## 2019-09-14 DIAGNOSIS — N185 Chronic kidney disease, stage 5: Secondary | ICD-10-CM | POA: Diagnosis not present

## 2019-09-14 DIAGNOSIS — M79604 Pain in right leg: Secondary | ICD-10-CM | POA: Diagnosis not present

## 2019-09-14 DIAGNOSIS — I10 Essential (primary) hypertension: Secondary | ICD-10-CM | POA: Diagnosis not present

## 2019-09-14 NOTE — Patient Instructions (Signed)
Set up follow up with Dr Ethlyn Gallery

## 2019-09-14 NOTE — Progress Notes (Signed)
Subjective:     Patient ID: Richard [name], male   DOB: 08-Jun-1956, 63 y.o.   MRN: [id number]  HPI   Richard [name] was seen as a work in.  He has multiple chronic problems including history of hypertension, history of CVA, type 2 diabetes with neuropathy, ongoing nicotine use, hyperlipidemia, chronic kidney disease.  He is seen today with some right leg swelling and pain.  He initially complained more of a burning kind of pain from the knee to the foot which occurs mostly at rest but also some with activity.  He then states he has had some increased edema and "tightness "of the calf region.  Denies any injury.  Not entirely clear but apparently has had some swelling of the leg for several weeks.  He does still smoke about 1/2 pack cigarettes per day.  Is not describing any classic claudication type symptoms.  He is on several medications listed but not clear how compliant he has been with these.  Denies any chest pains, acute dyspnea, or any pleuritic pain.  No hemoptysis.  He apparently in the past several years ago took Lyrica for neuropathy symptoms and that did seem to help back then. He had very poor compliance with follow-up.  He came in with right upper extremity edema in September and we obtained venous Dopplers which did not show any evidence for deep vein thrombosis.  He had fairly severe hypokalemia at that point with potassium 2.5 and creatinine 6.09.  He is followed by nephrology but has fallen out of follow-up recently  Past Medical History:  Diagnosis Date  . Arthritis   . Diabetes mellitus, type II, insulin dependent ([place])   . GERD (gastroesophageal reflux disease)   . Hypertension   . Migraines   . Neuropathy in diabetes ([place])    bilat feet  . Ruptured lumbar disc   . Stroke [facility]) 2013   residual right sided weakness (arm>leg)   Past Surgical History:  Procedure Laterality Date  . KNEE ARTHROSCOPY Bilateral     reports that he has been smoking cigarettes. He has  been smoking about 0.50 packs per day. He has never used smokeless tobacco. He reports current drug use. Drug: Marijuana. He reports that he does not drink alcohol. family history includes Breast cancer in his sister; Diabetes in his sister; Epilepsy in his father; Heart attack in his brother; Hypertension in his father; Other in his mother; Stroke in his brother and father. No Known Allergies   Review of Systems  Constitutional: Negative for chills and fever.  Respiratory: Negative for cough and shortness of breath.   Cardiovascular: Positive for leg swelling. Negative for chest pain.  Gastrointestinal: Negative for abdominal pain, nausea and vomiting.  Genitourinary: Negative for dysuria.  Neurological: Negative for syncope.  Psychiatric/Behavioral: Negative for confusion.       Objective:   Physical Exam Vitals reviewed.  Constitutional:      Appearance: Normal appearance.  Cardiovascular:     Rate and Rhythm: Normal rate and regular rhythm.  Pulmonary:     Comments: Somewhat diminished breath sounds throughout but clear.  No wheezes or rales Musculoskeletal:     Comments: He has trace edema right leg.  His right calf is mildly tender to palpation and feels tighter to palpation compared to the left.  Both feet are warm to touch with good capillary refill.  Trace to 1+ dorsalis pedis pulse bilaterally  Neurological:     Mental Status: He is alert.  Assessment:     #1 right leg pain and edema.  Doubt pain related to claudication though he is very high risk for arterial disease.  Foot is warm to touch.  Suspect he has some significant neuropathy symptoms contributing to pain.  Rule out acute DVT with slightly asymmetric edema as above and calf tenderness  #2 severe stage V chronic kidney disease by most recent labs  #3 type 2 diabetes with neuropathy and chronic kidney disease with poor control  #4 ongoing nicotine use    Plan:     -Patient strongly advised to try  to get back and his primary to establish more regular follow-up  -Strongly advised to discontinue smoking  -Set up venous Doppler right leg to rule out acute DVT  -If Doppler negative consider low-dose Lyrica for neuropathy symptoms.  Will need to be adjusted based on renal function  -Patient strongly advised to get back into see nephrologist more regularly  Richard Post MD [facility] at [facility]

## 2019-09-14 NOTE — Telephone Encounter (Signed)
Dr Ethlyn Gallery won't be in the office today.  Patient needs to reschedule with a different provider to be seen today.

## 2019-09-15 ENCOUNTER — Ambulatory Visit (HOSPITAL_COMMUNITY)
Admission: RE | Admit: 2019-09-15 | Discharge: 2019-09-15 | Disposition: A | Discharge status: Home / Self Care | Payer: Medicare HMO | Source: Ambulatory Visit | Attending: Family Medicine | Admitting: Family Medicine

## 2019-09-15 ENCOUNTER — Telehealth: Payer: Self-pay | Admitting: *Deleted

## 2019-09-15 DIAGNOSIS — M79604 Pain in right leg: Secondary | ICD-10-CM | POA: Diagnosis present

## 2019-09-15 LAB — BASIC METABOLIC PANEL
BUN: 38 mg/dL — ABNORMAL HIGH (ref 6–23)
CO2: 27 mEq/L (ref 19–32)
Calcium: 8 mg/dL — ABNORMAL LOW (ref 8.4–10.5)
Chloride: 105 mEq/L (ref 96–112)
Creatinine, Ser: 6.34 mg/dL (ref 0.40–1.50)
GFR: 10.87 mL/min — CL (ref >60.00)
Glucose, Bld: 101 mg/dL — ABNORMAL HIGH (ref 70–99)
Potassium: 3.8 mEq/L (ref 3.5–5.1)
Sodium: 140 mEq/L (ref 135–145)

## 2019-09-15 NOTE — Telephone Encounter (Signed)
Consulted with Dr. [name]. Per Dr. [name] patient needs to f/u with his nephrologist who he has seen in while. Clinic RN contacted patient and informed him of lab results and that he needs to f/u with nephrology. Patient verbalized understanding.

## 2019-09-15 NOTE — Telephone Encounter (Signed)
Noted  

## 2019-09-15 NOTE — Progress Notes (Signed)
RLE venous duplex       has been completed. Preliminary results can be found under CV proc through chart review. Rishabh Rinkenberger, BS, RDMS, RVT   

## 2019-09-15 NOTE — Telephone Encounter (Signed)
Received a call from [facility] at [facility]. Patient has a Creatinine 6.34 and GFR 10.87. Please advise

## 2019-09-19 ENCOUNTER — Ambulatory Visit: Payer: Self-pay

## 2019-09-19 NOTE — Telephone Encounter (Signed)
Patient states he will see nephrology.  H has misplaced number and contact numer.  Would like the number.   Also Patient ask the result the ultrasound.  Reports pain when putting on socks etc.

## 2019-10-05 ENCOUNTER — Other Ambulatory Visit: Payer: Self-pay | Admitting: Family Medicine

## 2019-10-05 DIAGNOSIS — E1142 Type 2 diabetes mellitus with diabetic polyneuropathy: Secondary | ICD-10-CM

## 2019-10-07 ENCOUNTER — Telehealth: Payer: Self-pay | Admitting: Family Medicine

## 2019-10-07 MED ORDER — SILDENAFIL CITRATE 20 MG PO TABS: ORAL_TABLET | ORAL | 0 refills | Status: DC

## 2019-10-07 NOTE — Telephone Encounter (Signed)
Rx done. 

## 2019-10-07 NOTE — Telephone Encounter (Signed)
sildenafil (REVATIO) 20 MG tablet     Patient requesting refill.    Pharmacy:  [facility] - [place], [place] - [address] Phone:  [phone]  Fax:  [phone]

## 2019-10-07 NOTE — Telephone Encounter (Signed)
Message Routed to PCP CMA 

## 2019-10-19 ENCOUNTER — Other Ambulatory Visit (HOSPITAL_COMMUNITY): Payer: Self-pay | Admitting: *Deleted

## 2019-10-20 ENCOUNTER — Inpatient Hospital Stay (HOSPITAL_COMMUNITY)
Admission: RE | Admit: 2019-10-20 | Discharge: 2019-10-20 | Disposition: A | Discharge status: Home / Self Care | Payer: Medicare HMO | Source: Ambulatory Visit | Attending: Nephrology | Admitting: Nephrology

## 2019-10-20 ENCOUNTER — Encounter (HOSPITAL_COMMUNITY): Payer: Self-pay

## 2019-10-27 ENCOUNTER — Encounter (HOSPITAL_COMMUNITY): Payer: Medicare HMO

## 2019-11-09 ENCOUNTER — Other Ambulatory Visit: Payer: Self-pay | Admitting: *Deleted

## 2019-11-09 DIAGNOSIS — L309 Dermatitis, unspecified: Secondary | ICD-10-CM

## 2019-11-09 MED ORDER — TRIAMCINOLONE ACETONIDE 0.1 % EX CREA: TOPICAL_CREAM | CUTANEOUS | 0 refills | Status: DC

## 2019-11-09 NOTE — Telephone Encounter (Signed)
Rx done. 

## 2019-11-16 ENCOUNTER — Other Ambulatory Visit (HOSPITAL_COMMUNITY): Payer: Medicare HMO

## 2019-11-16 ENCOUNTER — Encounter: Payer: Medicare HMO | Admitting: Vascular Surgery

## 2019-11-16 ENCOUNTER — Other Ambulatory Visit: Payer: Self-pay

## 2019-11-16 ENCOUNTER — Inpatient Hospital Stay (HOSPITAL_COMMUNITY): Admission: RE | Admit: 2019-11-16 | Payer: Medicare HMO | Source: Ambulatory Visit

## 2019-11-16 DIAGNOSIS — N184 Chronic kidney disease, stage 4 (severe): Secondary | ICD-10-CM

## 2019-11-17 ENCOUNTER — Encounter (HOSPITAL_COMMUNITY): Payer: Medicare HMO

## 2019-11-17 ENCOUNTER — Other Ambulatory Visit: Payer: Self-pay | Admitting: Family Medicine

## 2019-11-19 ENCOUNTER — Other Ambulatory Visit: Payer: Self-pay | Admitting: Family Medicine

## 2019-11-22 ENCOUNTER — Telehealth: Payer: Self-pay | Admitting: Family Medicine

## 2019-11-22 NOTE — Telephone Encounter (Signed)
Medication refill: sildenafil  Pharmacy: [facility]  FAX: [phone]   Pt has an appt with [facility] next available which isn't until March 19 at 11:30am. He is out of this medication and wondering if we can refill it until his appt on the 19th

## 2019-11-23 ENCOUNTER — Other Ambulatory Visit: Payer: Self-pay | Admitting: Family Medicine

## 2019-11-23 MED ORDER — SILDENAFIL CITRATE 20 MG PO TABS: ORAL_TABLET | ORAL | 0 refills | Status: DC

## 2019-11-23 NOTE — Telephone Encounter (Signed)
done

## 2019-12-01 ENCOUNTER — Other Ambulatory Visit: Payer: Self-pay | Admitting: *Deleted

## 2019-12-01 DIAGNOSIS — E1142 Type 2 diabetes mellitus with diabetic polyneuropathy: Secondary | ICD-10-CM

## 2019-12-01 MED ORDER — NOVOLOG MIX 70/30 FLEXPEN (70-30) 100 UNIT/ML ~~LOC~~ SUPN: PEN_INJECTOR | SUBCUTANEOUS | 0 refills | Status: DC

## 2019-12-01 NOTE — Telephone Encounter (Signed)
Rx done. 

## 2019-12-07 ENCOUNTER — Other Ambulatory Visit: Payer: Self-pay | Admitting: Family Medicine

## 2019-12-07 DIAGNOSIS — I10 Essential (primary) hypertension: Secondary | ICD-10-CM

## 2019-12-09 ENCOUNTER — Encounter: Payer: Self-pay | Admitting: Family Medicine

## 2019-12-09 ENCOUNTER — Other Ambulatory Visit: Payer: Self-pay

## 2019-12-09 ENCOUNTER — Telehealth (INDEPENDENT_AMBULATORY_CARE_PROVIDER_SITE_OTHER): Payer: Medicare HMO | Admitting: Family Medicine

## 2019-12-09 DIAGNOSIS — E119 Type 2 diabetes mellitus without complications: Secondary | ICD-10-CM

## 2019-12-09 DIAGNOSIS — Z794 Long term (current) use of insulin: Secondary | ICD-10-CM

## 2019-12-09 DIAGNOSIS — I10 Essential (primary) hypertension: Secondary | ICD-10-CM | POA: Diagnosis not present

## 2019-12-09 DIAGNOSIS — E785 Hyperlipidemia, unspecified: Secondary | ICD-10-CM | POA: Diagnosis not present

## 2019-12-09 DIAGNOSIS — L309 Dermatitis, unspecified: Secondary | ICD-10-CM

## 2019-12-09 DIAGNOSIS — N184 Chronic kidney disease, stage 4 (severe): Secondary | ICD-10-CM

## 2019-12-09 NOTE — Progress Notes (Signed)
Virtual Visit via Video Note  I connected with Richard [name]  on 12/09/19 at 11:30 AM EDT by a video enabled telemedicine application and verified that I am speaking with the correct person using two identifiers.  Location patient: home Location provider:work or home office Persons participating in the virtual visit: patient, provider  I discussed the limitations of evaluation and management by telemedicine and the availability of in person appointments. The patient expressed understanding and agreed to proceed.  Patient got a phone and was unable to connect via video.  Visit was completed via telephone.  Richard [name] DOB: 1956/08/29 Encounter date: 12/09/2019  This is a 64 y.o. male who presents with Chief Complaint  Patient presents with  . Follow-up    History of present illness:  Acid reflux is bothering him a lot - in mornings bothering him more. Feels like he is congested in his chest. Sometimes feels like it builds up. He takes the acid reflux medication, but sometimes before it can calm down he throws up clear, thick liquid. States that it is white. Doesn't happen every morning; just some mornings. Does feel better once he does this. Has been doing this for a year. Has had upper scope in past but this was over 40 years ago. No blood in stools, no dark stools, no abd pain. Just taking pepcid once daily.    Legs feel better. No more swelling and really not having any pain different than normal. Seemed to go away with swelling improvement.   Hasn't seen nephrology recently. Was supposed to go take "prep test" to go get ready for dialysis. Didn't want to get this done. Doesn't want to start dialysis.   HTN: hasn't checked at home.   Blood sugars: running normal except with eating. In the mornings he is getting 140-150 and after eating goes up to 250. States that he is pretty good about not missing doses of medication (although did miss about a month when  moving)   No Known Allergies Current Meds  Medication Sig  . ACCU-CHEK AVIVA PLUS test strip Use 1 test strip to test blood sugar 2-3 times daily  . ACCU-CHEK AVIVA PLUS test strip USE 1 strip TO test blood sugar 2 TIMES DAILY TO 3 TIMES DAILY  . amLODipine (NORVASC) 10 MG tablet Take 1 tablet (10 mg total) by mouth daily.  . blood glucose meter kit and supplies KIT Dispense based on patient and insurance preference. Check blood sugar 4 times a day  . chlorhexidine (PERIDEX) 0.12 % solution Swish with 71ms for 30 seconds then spit may use 2 times daily  . chlorthalidone (HYGROTON) 25 MG tablet TAKE 1 TABLET BY MOUTH EVERY DAY  . Cholecalciferol (VITAMIN D) 50 MCG (2000 UT) tablet TAKE 1 TABLET BY MOUTH EVERY MORNING  . cloNIDine (CATAPRES) 0.2 MG tablet TAKE 1 TABLET BY MOUTH 2 TIMES DAILY  . clopidogrel (PLAVIX) 75 MG tablet TAKE 1 TABLET BY MOUTH EVERY DAY  . clotrimazole (LOTRIMIN) 1 % cream Apply 1 application topically 2 (two) times daily.  . diphenhydrAMINE (BENADRYL) 50 MG tablet Take 0.5-1 tablets (25-50 mg total) by mouth 2 (two) times daily as needed for itching.  . famotidine (PEPCID) 20 MG tablet Take 1 tablet (20 mg total) by mouth 2 (two) times daily.  . furosemide (LASIX) 40 MG tablet Take 1 tablet (40 mg total) by mouth daily.  . insulin aspart protamine - aspart (NOVOLOG MIX 70/30 FLEXPEN) (70-30) 100 UNIT/ML FlexPen INJECT 28 UNITS  INTO THE SKIN EVERY MORNING  . insulin aspart protamine- aspart (NOVOLOG MIX 70/30) (70-30) 100 UNIT/ML injection Inject 0.15 mLs (15 Units total) into the skin at bedtime.  . Insulin Pen Needle (SURE COMFORT PEN NEEDLES) 31G X 8 MM MISC Use as directed twice daily with Novolog flex pen  . NARCAN 4 MG/0.1ML LIQD nasal spray kit   . Oxycodone HCl 20 MG TABS   . pantoprazole (PROTONIX) 40 MG tablet Take 1 tablet (40 mg total) by mouth daily.  . pravastatin (PRAVACHOL) 80 MG tablet TAKE 1 TABLET BY MOUTH AT BEDTIME  . sildenafil (REVATIO) 20 MG  tablet Take 1-5 tablets by mouth as needed  . tamsulosin (FLOMAX) 0.4 MG CAPS capsule TAKE 1 CAPSULE BY MOUTH EVERY DAY  . triamcinolone cream (KENALOG) 0.1 % Apply 1 application to affected area topically 2 times daily.  Karen Chafe INSULIN SYRINGE 31G X 5/16" 1 ML MISC Use pen needle with insulin 2 times daily    Review of Systems  Constitutional: Negative for chills, fatigue and fever.  Respiratory: Negative for cough, chest tightness, shortness of breath and wheezing.   Cardiovascular: Negative for chest pain, palpitations and leg swelling.    Objective:  There were no vitals taken for this visit.      BP Readings from Last 3 Encounters:  09/14/19 (!) 162/92  06/22/19 140/80  11/01/18 (!) 158/78   Wt Readings from Last 3 Encounters:  09/14/19 164 lb 9.6 oz (74.7 kg)  06/22/19 182 lb 1.6 oz (82.6 kg)  11/01/18 196 lb 9.6 oz (89.2 kg)    EXAM:  GENERAL: alert, oriented, appears well and in no acute distress  HEENT: atraumatic, conjunctiva clear, no obvious abnormalities on inspection of external nose and ears  NECK: normal movements of the head and neck  LUNGS: on inspection no signs of respiratory distress, breathing rate appears normal, no obvious gross SOB, gasping or wheezing  CV: no obvious cyanosis  MS: moves all visible extremities without noticeable abnormality  PSYCH/NEURO: pleasant and cooperative, no obvious depression or anxiety, speech and thought processing grossly intact   Assessment/Plan 1. Diabetes mellitus, type II, insulin dependent ([place]) He has not been checking his blood sugars.  We discussed importance of tight control, especially to help protect kidneys.  He is asked for referral to podiatry to help with foot care.  He was off his medications for some time when he recently moved. - Ambulatory referral to Podiatry  2. Dermatitis He has had a rash for months that has not resolved with multiple attempts at treatment in the office.  We are  referring to dermatology for further evaluation and treatment. - Ambulatory referral to Dermatology  3. Hyperlipidemia, unspecified hyperlipidemia type Continue pravastatin.  4. Essential hypertension Continue current medications.  Needs an office evaluation.  We discussed importance of good blood pressure control to help protect kidneys.  5. Chronic kidney disease, stage 4 (severe) ([place]) He did not wish to start dialysis, but I am not certain he completely understood the implications of this.  We reviewed today that his kidneys were not functioning properly, he will die.  We discussed that sometimes there is a fine line between poorly functioning and then stopping functioning.  I discussed that it was important for him to talk about this with his nephrologist, and that considering starting with the process of getting a dialysis access (if he desires to pursue discussion for dialysis) since it can take some time to establish functioning access site for  this.  We discussed importance of good sugar control and blood pressure control as it relates to kidneys.  Return in about 1 month (around 01/09/2020) for Chronic condition visit with bloodwork prior.    I discussed the assessment and treatment plan with the patient. The patient was provided an opportunity to ask questions and all were answered. The patient agreed with the plan and demonstrated an understanding of the instructions.   The patient was advised to call back or seek an in-person evaluation if the symptoms worsen or if the condition fails to improve as anticipated.  I provided 30 minutes of non-face-to-face time during this encounter.   [name], MD

## 2019-12-13 ENCOUNTER — Telehealth: Payer: Self-pay | Admitting: *Deleted

## 2019-12-13 NOTE — Telephone Encounter (Signed)
-----   Message from [name], MD sent at 12/12/2019  9:45 AM EDT ----- He needs bloodwork visit with [place] [facility] scheduled to follow please in about a month.

## 2019-12-13 NOTE — Telephone Encounter (Signed)
I left a detailed message at the pts cell number to call and schedule appts as below.

## 2020-01-02 ENCOUNTER — Telehealth (HOSPITAL_COMMUNITY): Payer: Self-pay

## 2020-01-02 ENCOUNTER — Other Ambulatory Visit: Payer: Self-pay | Admitting: *Deleted

## 2020-01-02 DIAGNOSIS — L309 Dermatitis, unspecified: Secondary | ICD-10-CM

## 2020-01-02 MED ORDER — TRIAMCINOLONE ACETONIDE 0.1 % EX CREA: TOPICAL_CREAM | CUTANEOUS | 0 refills | Status: DC

## 2020-01-02 NOTE — Telephone Encounter (Signed)

## 2020-01-02 NOTE — Telephone Encounter (Signed)
Rx done. 

## 2020-01-03 ENCOUNTER — Encounter: Payer: Self-pay | Admitting: Podiatry

## 2020-01-03 ENCOUNTER — Ambulatory Visit (INDEPENDENT_AMBULATORY_CARE_PROVIDER_SITE_OTHER): Payer: Medicare HMO | Admitting: Podiatry

## 2020-01-03 ENCOUNTER — Other Ambulatory Visit: Payer: Self-pay

## 2020-01-03 VITALS — Temp 97.2°F

## 2020-01-03 DIAGNOSIS — E119 Type 2 diabetes mellitus without complications: Secondary | ICD-10-CM | POA: Diagnosis not present

## 2020-01-03 DIAGNOSIS — B351 Tinea unguium: Secondary | ICD-10-CM | POA: Diagnosis not present

## 2020-01-03 DIAGNOSIS — Q828 Other specified congenital malformations of skin: Secondary | ICD-10-CM

## 2020-01-03 DIAGNOSIS — D689 Coagulation defect, unspecified: Secondary | ICD-10-CM | POA: Diagnosis not present

## 2020-01-03 DIAGNOSIS — M79675 Pain in left toe(s): Secondary | ICD-10-CM

## 2020-01-03 DIAGNOSIS — M79674 Pain in right toe(s): Secondary | ICD-10-CM

## 2020-01-03 NOTE — Progress Notes (Signed)
This patient returns to my office for at risk foot care.  This patient requires this care by a professional since this patient will be at risk due to having diabetic neuropathy and chronic kidney disease and coagulation defect.  Patient has not been seen in over 16 months.  Patient is taking plavix.  This patient is unable to cut nails himself since the patient cannot reach his nails.These nails are painful walking and wearing shoes.  This patient has a  skin lesion on the outside of his left foot. This patient presents for at risk foot care today.  General Appearance  Alert, conversant and in no acute stress.  Vascular  Dorsalis pedis and posterior tibial  pulses are palpable  bilaterally.  Capillary return is within normal limits  bilaterally. Temperature is within normal limits  bilaterally.  Neurologic  Senn-Weinstein monofilament wire test within normal limits  bilaterally. Muscle power within normal limits bilaterally.  Nails Thick disfigured discolored nails with subungual debris  from hallux to fifth toes bilaterally. No evidence of bacterial infection or drainage bilaterally.  Orthopedic  No limitations of motion  feet .  No crepitus or effusions noted.  No bony pathology or digital deformities noted.  Skin  normotropic skin  noted bilaterally.  No signs of infections or ulcers noted.   Porokeratosis on the dorsolateral aspect left foot.    Onychomycosis  Pain in right toes  Pain in left toes  Porokeratosis left foot.  Consent was obtained for treatment procedures.   Mechanical debridement of nails 1-5  bilaterally performed with a nail nipper.  Filed with dremel without incident. Debride porokeratosis with a # 15 blade.   Return office visit  3 months.                   Told patient to return for periodic foot care and evaluation due to potential at risk complications.   Gardiner Barefoot DPM

## 2020-01-04 ENCOUNTER — Encounter: Payer: Medicare HMO | Admitting: Vascular Surgery

## 2020-01-04 ENCOUNTER — Other Ambulatory Visit (HOSPITAL_COMMUNITY): Payer: Medicare HMO

## 2020-01-04 ENCOUNTER — Encounter (HOSPITAL_COMMUNITY): Payer: Medicare HMO

## 2020-01-06 ENCOUNTER — Encounter: Payer: Self-pay | Admitting: Surgery

## 2020-01-09 ENCOUNTER — Telehealth: Payer: Self-pay | Admitting: Family Medicine

## 2020-01-09 NOTE — Progress Notes (Signed)
  Chronic Care Management   Outreach Note  01/09/2020 Name: Richard [name] MRN: [id number] DOB: 03-13-1956  Referred by: [name], MD Reason for referral : No chief complaint on file.   An unsuccessful telephone outreach was attempted today. The patient was referred to the pharmacist for assistance with care management and care coordination.   Follow Up Plan:   Raynicia Dukes [employer] Scheduler

## 2020-01-20 ENCOUNTER — Telehealth: Payer: Self-pay | Admitting: Family Medicine

## 2020-01-23 ENCOUNTER — Other Ambulatory Visit: Payer: Self-pay | Admitting: Family Medicine

## 2020-01-24 ENCOUNTER — Other Ambulatory Visit: Payer: Self-pay | Admitting: *Deleted

## 2020-01-24 MED ORDER — SILDENAFIL CITRATE 20 MG PO TABS: ORAL_TABLET | ORAL | 0 refills | Status: DC

## 2020-01-24 NOTE — Telephone Encounter (Signed)
Rx done. 

## 2020-01-31 ENCOUNTER — Telehealth: Payer: Self-pay | Admitting: Family Medicine

## 2020-01-31 DIAGNOSIS — G8929 Other chronic pain: Secondary | ICD-10-CM

## 2020-01-31 NOTE — Telephone Encounter (Signed)
Pt call and stated he need a referral to a new pain doctor. Because the one that he is going to is closing down.Richard [name]

## 2020-02-01 NOTE — Telephone Encounter (Signed)
[facility] for this. Please place order. Remind him to follow up with nephrology if he hasn't done so already!

## 2020-02-02 NOTE — Telephone Encounter (Signed)
Referral placed and I left a detailed message with the information below at the pts cell number.

## 2020-02-07 ENCOUNTER — Other Ambulatory Visit: Payer: Self-pay | Admitting: *Deleted

## 2020-02-07 DIAGNOSIS — N184 Chronic kidney disease, stage 4 (severe): Secondary | ICD-10-CM

## 2020-02-13 ENCOUNTER — Emergency Department (HOSPITAL_COMMUNITY): Payer: Medicare HMO

## 2020-02-13 ENCOUNTER — Inpatient Hospital Stay (HOSPITAL_COMMUNITY)
Admission: EM | Admit: 2020-02-13 | Discharge: 2020-02-16 | DRG: 100 | Disposition: A | Discharge status: Home Health | Payer: Medicare HMO | Attending: Internal Medicine | Admitting: Internal Medicine

## 2020-02-13 ENCOUNTER — Other Ambulatory Visit: Payer: Self-pay

## 2020-02-13 DIAGNOSIS — Z720 Tobacco use: Secondary | ICD-10-CM | POA: Diagnosis present

## 2020-02-13 DIAGNOSIS — E1142 Type 2 diabetes mellitus with diabetic polyneuropathy: Secondary | ICD-10-CM

## 2020-02-13 DIAGNOSIS — R404 Transient alteration of awareness: Secondary | ICD-10-CM

## 2020-02-13 DIAGNOSIS — N184 Chronic kidney disease, stage 4 (severe): Secondary | ICD-10-CM | POA: Diagnosis present

## 2020-02-13 DIAGNOSIS — E1129 Type 2 diabetes mellitus with other diabetic kidney complication: Secondary | ICD-10-CM | POA: Diagnosis not present

## 2020-02-13 DIAGNOSIS — N185 Chronic kidney disease, stage 5: Secondary | ICD-10-CM | POA: Diagnosis present

## 2020-02-13 DIAGNOSIS — E785 Hyperlipidemia, unspecified: Secondary | ICD-10-CM | POA: Diagnosis present

## 2020-02-13 DIAGNOSIS — N2581 Secondary hyperparathyroidism of renal origin: Secondary | ICD-10-CM | POA: Diagnosis present

## 2020-02-13 DIAGNOSIS — Z820 Family history of epilepsy and other diseases of the nervous system: Secondary | ICD-10-CM

## 2020-02-13 DIAGNOSIS — K219 Gastro-esophageal reflux disease without esophagitis: Secondary | ICD-10-CM | POA: Diagnosis present

## 2020-02-13 DIAGNOSIS — E1122 Type 2 diabetes mellitus with diabetic chronic kidney disease: Secondary | ICD-10-CM | POA: Diagnosis present

## 2020-02-13 DIAGNOSIS — R29810 Facial weakness: Secondary | ICD-10-CM | POA: Diagnosis not present

## 2020-02-13 DIAGNOSIS — R569 Unspecified convulsions: Secondary | ICD-10-CM | POA: Diagnosis not present

## 2020-02-13 DIAGNOSIS — Z79899 Other long term (current) drug therapy: Secondary | ICD-10-CM | POA: Diagnosis not present

## 2020-02-13 DIAGNOSIS — G9341 Metabolic encephalopathy: Secondary | ICD-10-CM | POA: Diagnosis not present

## 2020-02-13 DIAGNOSIS — M1990 Unspecified osteoarthritis, unspecified site: Secondary | ICD-10-CM | POA: Diagnosis present

## 2020-02-13 DIAGNOSIS — Z20822 Contact with and (suspected) exposure to covid-19: Secondary | ICD-10-CM | POA: Diagnosis present

## 2020-02-13 DIAGNOSIS — Z823 Family history of stroke: Secondary | ICD-10-CM | POA: Diagnosis not present

## 2020-02-13 DIAGNOSIS — G459 Transient cerebral ischemic attack, unspecified: Secondary | ICD-10-CM | POA: Diagnosis not present

## 2020-02-13 DIAGNOSIS — Z833 Family history of diabetes mellitus: Secondary | ICD-10-CM

## 2020-02-13 DIAGNOSIS — R4182 Altered mental status, unspecified: Secondary | ICD-10-CM | POA: Diagnosis not present

## 2020-02-13 DIAGNOSIS — Z03818 Encounter for observation for suspected exposure to other biological agents ruled out: Secondary | ICD-10-CM | POA: Diagnosis not present

## 2020-02-13 DIAGNOSIS — G40209 Localization-related (focal) (partial) symptomatic epilepsy and epileptic syndromes with complex partial seizures, not intractable, without status epilepticus: Secondary | ICD-10-CM | POA: Diagnosis not present

## 2020-02-13 DIAGNOSIS — I120 Hypertensive chronic kidney disease with stage 5 chronic kidney disease or end stage renal disease: Secondary | ICD-10-CM | POA: Diagnosis present

## 2020-02-13 DIAGNOSIS — I10 Essential (primary) hypertension: Secondary | ICD-10-CM | POA: Diagnosis not present

## 2020-02-13 DIAGNOSIS — G43909 Migraine, unspecified, not intractable, without status migrainosus: Secondary | ICD-10-CM | POA: Diagnosis present

## 2020-02-13 DIAGNOSIS — D631 Anemia in chronic kidney disease: Secondary | ICD-10-CM | POA: Diagnosis present

## 2020-02-13 DIAGNOSIS — I69351 Hemiplegia and hemiparesis following cerebral infarction affecting right dominant side: Secondary | ICD-10-CM

## 2020-02-13 DIAGNOSIS — E872 Acidosis: Secondary | ICD-10-CM | POA: Diagnosis present

## 2020-02-13 DIAGNOSIS — E441 Mild protein-calorie malnutrition: Secondary | ICD-10-CM

## 2020-02-13 DIAGNOSIS — E1140 Type 2 diabetes mellitus with diabetic neuropathy, unspecified: Secondary | ICD-10-CM | POA: Diagnosis present

## 2020-02-13 DIAGNOSIS — Z8249 Family history of ischemic heart disease and other diseases of the circulatory system: Secondary | ICD-10-CM | POA: Diagnosis not present

## 2020-02-13 DIAGNOSIS — R4020 Unspecified coma: Secondary | ICD-10-CM | POA: Diagnosis not present

## 2020-02-13 DIAGNOSIS — F17210 Nicotine dependence, cigarettes, uncomplicated: Secondary | ICD-10-CM | POA: Diagnosis present

## 2020-02-13 DIAGNOSIS — Z794 Long term (current) use of insulin: Secondary | ICD-10-CM | POA: Diagnosis not present

## 2020-02-13 DIAGNOSIS — G9340 Encephalopathy, unspecified: Secondary | ICD-10-CM | POA: Diagnosis not present

## 2020-02-13 DIAGNOSIS — R29818 Other symptoms and signs involving the nervous system: Secondary | ICD-10-CM | POA: Diagnosis not present

## 2020-02-13 HISTORY — DX: Altered mental status, unspecified: R41.82

## 2020-02-13 LAB — COMPREHENSIVE METABOLIC PANEL
ALT: 10 U/L (ref 0–44)
AST: 12 U/L — ABNORMAL LOW (ref 15–41)
Albumin: 3 g/dL — ABNORMAL LOW (ref 3.5–5.0)
Alkaline Phosphatase: 89 U/L (ref 38–126)
Anion gap: 17 — ABNORMAL HIGH (ref 5–15)
BUN: 64 mg/dL — ABNORMAL HIGH (ref 8–23)
CO2: 13 mmol/L — ABNORMAL LOW (ref 22–32)
Calcium: 8.4 mg/dL — ABNORMAL LOW (ref 8.9–10.3)
Chloride: 111 mmol/L (ref 98–111)
Creatinine, Ser: 10.9 mg/dL — ABNORMAL HIGH (ref 0.61–1.24)
GFR calc Af Amer: 5 mL/min — ABNORMAL LOW (ref >60)
GFR calc non Af Amer: 4 mL/min — ABNORMAL LOW (ref >60)
Glucose, Bld: 113 mg/dL — ABNORMAL HIGH (ref 70–99)
Potassium: 3.9 mmol/L (ref 3.5–5.1)
Sodium: 141 mmol/L (ref 135–145)
Total Bilirubin: 0.7 mg/dL (ref 0.3–1.2)
Total Protein: 6.3 g/dL — ABNORMAL LOW (ref 6.5–8.1)

## 2020-02-13 LAB — PROTIME-INR
INR: 1 (ref 0.8–1.2)
Prothrombin Time: 12.6 seconds (ref 11.4–15.2)

## 2020-02-13 LAB — I-STAT CHEM 8, ED
BUN: 55 mg/dL — ABNORMAL HIGH (ref 8–23)
Calcium, Ion: 1.04 mmol/L — ABNORMAL LOW (ref 1.15–1.40)
Chloride: 112 mmol/L — ABNORMAL HIGH (ref 98–111)
Creatinine, Ser: 11.4 mg/dL — ABNORMAL HIGH (ref 0.61–1.24)
Glucose, Bld: 108 mg/dL — ABNORMAL HIGH (ref 70–99)
HCT: 32 % — ABNORMAL LOW (ref 39.0–52.0)
Hemoglobin: 10.9 g/dL — ABNORMAL LOW (ref 13.0–17.0)
Potassium: 3.8 mmol/L (ref 3.5–5.1)
Sodium: 140 mmol/L (ref 135–145)
TCO2: 14 mmol/L — ABNORMAL LOW (ref 22–32)

## 2020-02-13 LAB — CBG MONITORING, ED
Glucose-Capillary: 110 mg/dL — ABNORMAL HIGH (ref 70–99)
Glucose-Capillary: 106 mg/dL — ABNORMAL HIGH (ref 70–99)

## 2020-02-13 LAB — DIFFERENTIAL
Abs Immature Granulocytes: 0.03 10*3/uL (ref 0.00–0.07)
Basophils Absolute: 0 10*3/uL (ref 0.0–0.1)
Basophils Relative: 0 %
Eosinophils Absolute: 0 10*3/uL (ref 0.0–0.5)
Eosinophils Relative: 0 %
Immature Granulocytes: 1 %
Lymphocytes Relative: 11 %
Lymphs Abs: 0.6 10*3/uL — ABNORMAL LOW (ref 0.7–4.0)
Monocytes Absolute: 0.5 10*3/uL (ref 0.1–1.0)
Monocytes Relative: 8 %
Neutro Abs: 4.3 10*3/uL (ref 1.7–7.7)
Neutrophils Relative %: 80 %

## 2020-02-13 LAB — CBC
HCT: 32.4 % — ABNORMAL LOW (ref 39.0–52.0)
Hemoglobin: 10.5 g/dL — ABNORMAL LOW (ref 13.0–17.0)
MCH: 30.3 pg (ref 26.0–34.0)
MCHC: 32.4 g/dL (ref 30.0–36.0)
MCV: 93.4 fL (ref 80.0–100.0)
Platelets: 141 10*3/uL — ABNORMAL LOW (ref 150–400)
RBC: 3.47 MIL/uL — ABNORMAL LOW (ref 4.22–5.81)
RDW: 15.4 % (ref 11.5–15.5)
WBC: 5.4 10*3/uL (ref 4.0–10.5)
nRBC: 0 % (ref 0.0–0.2)

## 2020-02-13 LAB — APTT: aPTT: 31 seconds (ref 24–36)

## 2020-02-13 LAB — SARS CORONAVIRUS 2 BY RT PCR (HOSPITAL ORDER, PERFORMED IN ~~LOC~~ HOSPITAL LAB): SARS Coronavirus 2: NEGATIVE

## 2020-02-13 MED ORDER — SODIUM CHLORIDE 0.9 % IV SOLN: INTRAVENOUS | Status: DC

## 2020-02-13 MED ORDER — SENNOSIDES-DOCUSATE SODIUM 8.6-50 MG PO TABS: 1.0000 | ORAL_TABLET | Freq: Every evening | ORAL | Status: DC | PRN

## 2020-02-13 MED ORDER — STROKE: EARLY STAGES OF RECOVERY BOOK
Freq: Once | Status: DC
  Filled 2020-02-13: qty 1

## 2020-02-13 MED ORDER — ACETAMINOPHEN 160 MG/5ML PO SOLN: 650.0000 mg | ORAL | Status: DC | PRN

## 2020-02-13 MED ORDER — ACETAMINOPHEN 325 MG PO TABS
650.0000 mg | ORAL_TABLET | ORAL | Status: DC | PRN
  Administered 2020-02-16: 650 mg via ORAL
  Filled 2020-02-13: qty 2

## 2020-02-13 MED ORDER — INSULIN ASPART 100 UNIT/ML ~~LOC~~ SOLN: 0.0000 [IU] | Freq: Every day | SUBCUTANEOUS | Status: DC

## 2020-02-13 MED ORDER — SODIUM CHLORIDE 0.9% FLUSH: 3.0000 mL | Freq: Once | INTRAVENOUS | Status: DC

## 2020-02-13 MED ORDER — INSULIN ASPART 100 UNIT/ML ~~LOC~~ SOLN
0.0000 [IU] | Freq: Three times a day (TID) | SUBCUTANEOUS | Status: DC
  Administered 2020-02-15 – 2020-02-16 (×2): 1 [IU] via SUBCUTANEOUS

## 2020-02-13 MED ORDER — LEVETIRACETAM IN NACL 500 MG/100ML IV SOLN
500.0000 mg | Freq: Two times a day (BID) | INTRAVENOUS | Status: DC
  Administered 2020-02-13 – 2020-02-16 (×6): 500 mg via INTRAVENOUS
  Filled 2020-02-13 (×9): qty 100

## 2020-02-13 MED ORDER — ACETAMINOPHEN 650 MG RE SUPP: 650.0000 mg | RECTAL | Status: DC | PRN

## 2020-02-13 MED ORDER — ENOXAPARIN SODIUM 40 MG/0.4ML ~~LOC~~ SOLN
40.0000 mg | SUBCUTANEOUS | Status: DC
  Administered 2020-02-14: 40 mg via SUBCUTANEOUS
  Filled 2020-02-13: qty 0.4

## 2020-02-13 NOTE — Consult Note (Addendum)
Neurology Consultation  Reason for Consult: Code stroke Referring Physician: Marin Roberts  CC: Altered mental status  History is obtained from: EMS  HPI: Richard [name] is a 64 y.o. male with past medical history of stroke, diabetic neuropathy, migraines, hypertensive emergency, hypertension and diabetes.  Patient was last seen at 1620 this afternoon.  EMS was called out secondary to girlfriend finding husband unresponsive.  Per girlfriend-who apparently was very inebriated-, patient had asked her to get him a glass of water.  Upon getting back out to the porch she noticed that he was very unresponsive with water drooling down his mouth when trying to take a glass of water.  EMS was called.  On arrival he was unresponsive.  Throughout the transfer to the hospital he became slightly more alert.  On arrival to the ER patient was following simple commands but still was very lethargic.  There was no description of urinary incontinence or tongue biting.  No description of any form of seizure activity.  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  Patient has history small to moderate sized nonhemorrhagic infarct of the posterior left corona radiata that extends to the posterior aspect of the left internal capsule back in 2013.  ED course  CT head shows-no acute intracranial abnormality/hemorrhage  Chart review (patient has been seen back in 2013 for right-sided weakness.  At that time patient was significantly weak with significant contracture on the right side from previous stroke.  Patient not been seen by neurology in 5 years.  LKW: 68 tpa given?: no, significant disability at baseline Premorbid modified Rankin scale (mRS): 3 NIH stroke score: 21   Past Medical History:  Diagnosis Date   Arthritis    Diabetes mellitus, type II, insulin dependent (HCC)    GERD (gastroesophageal reflux disease)    Hypertension    Hypertensive emergency  05/21/2012   Migraines    Neuropathy in diabetes ([place])    bilat feet   Ruptured lumbar disc    Stroke ([place]) 2013   residual right sided weakness (arm>leg)    Family History  Problem Relation Age of Onset   Other Mother    Stroke Father    Hypertension Father    Epilepsy Father    Breast cancer Sister    Stroke Brother    Heart attack Brother    Diabetes Sister    Social History:   reports that he has been smoking cigarettes. He has been smoking about 0.50 packs per day. He has never used smokeless tobacco. He reports current drug use. Drug: Marijuana. He reports that he does not drink alcohol.  Medications  Current Facility-Administered Medications:    sodium chloride flush (NS) 0.9 % injection 3 mL, 3 mL, Intravenous, Once, Tegeler, Gwenyth Allegra, MD  Current Outpatient Medications:    ACCU-CHEK AVIVA PLUS test strip, Use 1 test strip to test blood sugar 2-3 times daily, Disp: , Rfl: 1   ACCU-CHEK AVIVA PLUS test strip, USE 1 strip TO test blood sugar 2 TIMES DAILY TO 3 TIMES DAILY, Disp: 100 strip, Rfl: 1   amLODipine (NORVASC) 10 MG tablet, Take 1 tablet (10 mg total) by mouth daily., Disp: 90 tablet, Rfl: 1   blood glucose meter kit and supplies KIT, Dispense based on patient and insurance preference. Check blood sugar 4 times a day, Disp: 1 each, Rfl: 0   calcitRIOL (ROCALTROL) 0.25 MCG capsule, , Disp: , Rfl:    chlorhexidine (PERIDEX) 0.12 %  solution, Swish with 9ms for 30 seconds then spit may use 2 times daily, Disp: , Rfl: 2   chlorthalidone (HYGROTON) 25 MG tablet, TAKE 1 TABLET BY MOUTH EVERY DAY, Disp: 90 tablet, Rfl: 0   Cholecalciferol (VITAMIN D) 50 MCG (2000 UT) tablet, TAKE 1 TABLET BY MOUTH EVERY MORNING, Disp: 90 tablet, Rfl: 1   cloNIDine (CATAPRES) 0.2 MG tablet, TAKE 1 TABLET BY MOUTH 2 TIMES DAILY, Disp: 180 tablet, Rfl: 0   clopidogrel (PLAVIX) 75 MG tablet, TAKE 1 TABLET BY MOUTH EVERY DAY, Disp: 90 tablet, Rfl: 1   clotrimazole (LOTRIMIN) 1 % cream,  Apply 1 application topically 2 (two) times daily., Disp: 30 g, Rfl: 0   diphenhydrAMINE (BENADRYL) 50 MG tablet, Take 0.5-1 tablets (25-50 mg total) by mouth 2 (two) times daily as needed for itching., Disp: 30 tablet, Rfl: 0   famotidine (PEPCID) 20 MG tablet, Take 1 tablet (20 mg total) by mouth 2 (two) times daily., Disp: 60 tablet, Rfl: 0   furosemide (LASIX) 40 MG tablet, Take 1 tablet (40 mg total) by mouth daily., Disp: 30 tablet, Rfl: 3   insulin aspart protamine - aspart (NOVOLOG MIX 70/30 FLEXPEN) (70-30) 100 UNIT/ML FlexPen, INJECT 28 UNITS INTO THE SKIN EVERY MORNING, Disp: 15 mL, Rfl: 0   insulin aspart protamine- aspart (NOVOLOG MIX 70/30) (70-30) 100 UNIT/ML injection, Inject 0.15 mLs (15 Units total) into the skin at bedtime., Disp: 10 mL, Rfl: 11   Insulin Pen Needle (SURE COMFORT PEN NEEDLES) 31G X 8 MM MISC, Use as directed twice daily with Novolog flex pen, Disp: 100 each, Rfl: 3   NARCAN 4 MG/0.1ML LIQD nasal spray kit, , Disp: , Rfl:    Oxycodone HCl 20 MG TABS, , Disp: , Rfl:    pantoprazole (PROTONIX) 40 MG tablet, TAKE 1 TABLET BY MOUTH EVERY DAY, Disp: 90 tablet, Rfl: 0   potassium chloride SA (KLOR-CON) 20 MEQ tablet, , Disp: , Rfl:    pravastatin (PRAVACHOL) 80 MG tablet, TAKE 1 TABLET BY MOUTH AT BEDTIME, Disp: 90 tablet, Rfl: 1   sildenafil (REVATIO) 20 MG tablet, Take 1-5 tablets by mouth as needed, Disp: 90 tablet, Rfl: 0   tamsulosin (FLOMAX) 0.4 MG CAPS capsule, TAKE 1 CAPSULE BY MOUTH EVERY DAY, Disp: 90 capsule, Rfl: 1   triamcinolone cream (KENALOG) 0.1 %, Apply 1 application to affected area topically 2 times daily., Disp: 30 g, Rfl: 0   TRUEPLUS INSULIN SYRINGE 31G X 5/16" 1 ML MISC, Use pen needle with insulin 2 times daily, Disp: 100 each, Rfl: 1  ROS:  Unable to obtain due to altered mental status.    Exam: Current vital signs: There were no vitals taken for this visit. Vital signs in last 24 hours:     Constitutional: Appears well-developed and  well-nourished.  Eyes: No scleral injection HENT: No OP obstrucion Head: Normocephalic.  Cardiovascular: Normal rate and regular rhythm.  Respiratory: Effort normal, non-labored breathing GI: Soft.  No distension. There is no tenderness.  Skin: WDI  Neuro: Mental Status: Patient is very drowsy, able to state his name and follows very simple commands.  His voice is dysarthric but not aphasic.  He has difficulty naming, repeating and comprehension secondary to his significant drowsiness.  Patient is unable to give [place] history.  Cranial Nerves: II: Right hemianopsia III,IV, VI: EOMI without ptosis or diploplia. Pupils equal, round and reactive to light V: Facial sensation is symmetric to temperature VII: Facial movement is symmetric.  VIII:  hearing is intact to voice XI: Left shoulder shrug greater than right XII: tongue is midline without atrophy or fasciculations.  Motor: Patient's right upper extremity is significantly contracted with 0/5 strength, his right lower extremity is able to bend at the knee and the hip but not able to lift off the bed.  Is left lower extremity again with plantar stimulation bends at the knee and the hip but not lifting off the bed and has significant drift which falls to the bed.  Left arm has 3/5 strength with drift that slowly goes to the bed.  Patient is becoming more alert as time goes on and exam is continually changing Sensory: Sensation is symmetric to light touch and temperature in the arms and legs. Deep Tendon Reflexes: I could not elicit any ankle jerks or knee jerks but he did have 1+ brachioradialis Plantars: Toes are downgoing bilaterally.  Cerebellar: Unable to assess   Labs I have reviewed labs in epic and the results pertinent to this consultation are:  CBC    Component Value Date/Time   WBC 4.4 06/22/2019 1505   RBC 3.52 (L) 06/22/2019 1505   HGB 10.9 (L) 02/13/2020 1737   HCT 32.0 (L) 02/13/2020 1737   PLT 224.0 06/22/2019  1505   MCV 91.1 06/22/2019 1505   MCH 28.4 10/02/2018 0228   MCHC 33.2 06/22/2019 1505   RDW 15.2 06/22/2019 1505   LYMPHSABS 1.5 06/22/2019 1505   MONOABS 0.3 06/22/2019 1505   EOSABS 0.1 06/22/2019 1505   BASOSABS 0.0 06/22/2019 1505    CMP     Component Value Date/Time   NA 140 02/13/2020 1737   K 3.8 02/13/2020 1737   CL 112 (H) 02/13/2020 1737   CO2 27 09/14/2019 1558   GLUCOSE 108 (H) 02/13/2020 1737   BUN 55 (H) 02/13/2020 1737   CREATININE 11.40 (H) 02/13/2020 1737   CALCIUM 8.0 (L) 09/14/2019 1558   PROT 5.4 (L) 06/22/2019 1505   ALBUMIN 2.3 (L) 06/22/2019 1505   AST 14 06/22/2019 1505   ALT 8 06/22/2019 1505   ALKPHOS 121 (H) 06/22/2019 1505   BILITOT 0.4 06/22/2019 1505   GFRNONAA 19 (L) 10/02/2018 0228   GFRAA 22 (L) 10/02/2018 0228    Lipid Panel     Component Value Date/Time   CHOL 108 05/05/2018 1432   TRIG 202.0 (H) 05/05/2018 1432   HDL 37.90 (L) 05/05/2018 1432   CHOLHDL 3 05/05/2018 1432   VLDL 40.4 (H) 05/05/2018 1432   LDLCALC 122 (H) 08/24/2012 0625   LDLDIRECT 51.0 05/05/2018 1432     Imaging I have reviewed the images obtained:  CT-scan of the brain-no acute intracranial abnormality noted   Etta Quill PA-C [facility] [phone]  M-F  (9:00 am- 5:00 PM)  02/13/2020, 5:45 PM     Assessment:  This 64 year old male with prior history of stroke leaving his right upper extremity with significant contracture and right lower extremity with increased tone.  As noted above patient suddenly became unresponsive.  There was no shaking noted however the most likely given the fact that he is slowly improving with mentation and able to follow commands with prior history of left hemispheric stroke, likely patient suffered from a seizure.  However, that said patient will need an MRI to fully rule out no additional strokes.  Patient was not a TPA candidate secondary to significant disability at baseline.  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   I have reviewed the contents of history and physical exam as documented by PA/ARNP/Resident and agree with above documentation.  I have discussed and formulated the above plan as documented. Edits to the note have been made as needed.  64 year old male with past medical history of CVA with residual right hemiparesis and contracture brought in as a code stroke due to sudden onset unresponsiveness.  EMS was called by wife who was apparently intoxicated.  Per EMS initially patient was unresponsive however gradually became more responsive and started to follow commands.  Moving left side with good strength.  Patient remains somnolent and would answer only his name correctly.  Stat CT head showed no hemorrhage.  Patient not a candidate for TPA as patient has moderate disability at baseline and his current symptoms do not increase extent of disability as well as lower suspicion for stroke.  High suspicion for seizures and will start patient on Keppra 500 mg twice daily.  Patient needs infectious metabolic work-up as well as MRI brain and routine EEG.  Neurology will continue to follow     Karena Addison Geniyah Eischeid MD [facility] [phone]   If 7pm to 7am, please call on call as listed on AMION.

## 2020-02-13 NOTE — ED Provider Notes (Signed)
[name] Provider Note   CSN: [id number] Arrival date & time: 02/13/20  1719     History No chief complaint on file.   [name] is a 64 y.o. male.  The history is provided by the patient, the EMS personnel and medical records. No language interpreter was used.   [name] is a 64 y.o. male who presents to the Emergency Department complaining of AMS, code stroke.  Level V caveat due to AMS.  He presents to the ED by EMS as a code stroke.  He was sitting on the porch with his girlfriend and he asked for a drink.  When she came back he was unresponsive. EMS reports that he was unresponsive on their arrival but he began to regain consciousness in route to the hospital in a code stroke was activated. He does have a history of prior CVA with right sided deficits.    Past Medical History:  Diagnosis Date  . Arthritis   . Diabetes mellitus, type II, insulin dependent ([place])   . GERD (gastroesophageal reflux disease)   . Hypertension   . Hypertensive emergency 05/21/2012  . Migraines   . Neuropathy in diabetes ([place])    bilat feet  . Ruptured lumbar disc   . Stroke [facility]) 2013   residual right sided weakness (arm>leg)    Patient Active Problem List   Diagnosis Date Noted  . Neuropathy in diabetes ([place])   . Migraines   . Hypertension   . GERD (gastroesophageal reflux disease)   . Diabetes mellitus, type II, insulin dependent ([place])   . Arthritis   . Stroke ([place]) 08/26/2012  . Hyperlipidemia 08/25/2012  . Chronic kidney disease, stage 4 (severe) ([place]) 08/23/2012  . Tobacco abuse 08/23/2012  . Protein-calorie malnutrition, mild ([place]) 08/23/2012  . Type 2 diabetes mellitus ([place]) 08/23/2012  . 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 KNEE ARTHROSCOPY Bilateral        Family History  Problem Relation Age of Onset  . Other  Mother   . Stroke Father   . Hypertension Father   . Epilepsy Father   . Breast cancer Sister   . Stroke Brother   . Heart attack Brother   . Diabetes Sister     Social History   Tobacco Use  . Smoking status: Current Every Day Smoker    Packs/day: 0.50    Types: Cigarettes  . Smokeless tobacco: Never Used  Substance Use Topics  . Alcohol use: No  . Drug use: Yes    Types: Marijuana    Home Medications Prior to Admission medications   Medication Sig Start Date End Date Taking? Authorizing Provider  ACCU-CHEK AVIVA PLUS test strip Use 1 test strip to test blood sugar 2-3 times daily 04/20/18   [provider]  ACCU-CHEK AVIVA PLUS test strip USE 1 strip TO test blood sugar 2 TIMES DAILY TO 3 TIMES DAILY 06/10/19   Koberlein, Andris Flurry C, MD  amLODipine (NORVASC) 10 MG tablet Take 1 tablet (10 mg total) by mouth daily. 07/06/19   [name], MD  blood glucose meter kit and supplies KIT Dispense based on patient and insurance preference. Check blood sugar 4 times a day 06/11/18   [name], MD  calcitRIOL (ROCALTROL) 0.25 MCG capsule  10/04/19   [provider]  chlorhexidine (PERIDEX) 0.12 % solution Swish with 28ms for  30 seconds then spit may use 2 times daily 06/01/18   [provider]  chlorthalidone (HYGROTON) 25 MG tablet TAKE 1 TABLET BY MOUTH EVERY DAY 07/27/19   Koberlein, [name], MD  Cholecalciferol (VITAMIN D) 50 MCG (2000 UT) tablet TAKE 1 TABLET BY MOUTH EVERY MORNING 01/03/19   Koberlein, Junell C, MD  cloNIDine (CATAPRES) 0.2 MG tablet TAKE 1 TABLET BY MOUTH 2 TIMES DAILY 12/07/19   Koberlein, [name], MD  clopidogrel (PLAVIX) 75 MG tablet TAKE 1 TABLET BY MOUTH EVERY DAY 06/11/19   Koberlein, Andris Flurry C, MD  clotrimazole (LOTRIMIN) 1 % cream Apply 1 application topically 2 (two) times daily. 11/01/18   [name], MD  diphenhydrAMINE (BENADRYL) 50 MG tablet Take 0.5-1 tablets (25-50 mg total) by mouth 2 (two) times daily  as needed for itching. 01/19/19   [name], MD  famotidine (PEPCID) 20 MG tablet Take 1 tablet (20 mg total) by mouth 2 (two) times daily. 08/10/19   [name], MD  furosemide (LASIX) 40 MG tablet Take 1 tablet (40 mg total) by mouth daily. 06/22/19   Burchette, Alinda Sierras, MD  insulin aspart protamine - aspart (NOVOLOG MIX 70/30 FLEXPEN) (70-30) 100 UNIT/ML FlexPen INJECT 28 UNITS INTO THE SKIN EVERY MORNING 12/01/19   Koberlein, [name], MD  insulin aspart protamine- aspart (NOVOLOG MIX 70/30) (70-30) 100 UNIT/ML injection Inject 0.15 mLs (15 Units total) into the skin at bedtime. 06/11/18   Koberlein, [name], MD  Insulin Pen Needle (SURE COMFORT PEN NEEDLES) 31G X 8 MM MISC Use as directed twice daily with Novolog flex pen 02/10/19   [name], MD  NARCAN 4 MG/0.1ML LIQD nasal spray kit  05/31/18   [provider]  Oxycodone HCl 20 MG TABS  05/31/18   [provider]  pantoprazole (PROTONIX) 40 MG tablet TAKE 1 TABLET BY MOUTH EVERY DAY 01/23/20   [name], MD  potassium chloride SA (KLOR-CON) 20 MEQ tablet  12/07/19   [provider]  pravastatin (PRAVACHOL) 80 MG tablet TAKE 1 TABLET BY MOUTH AT BEDTIME 03/30/19   Koberlein, Junell C, MD  sildenafil (REVATIO) 20 MG tablet Take 1-5 tablets by mouth as needed 01/24/20   Koberlein, Andris Flurry C, MD  tamsulosin (FLOMAX) 0.4 MG CAPS capsule TAKE 1 CAPSULE BY MOUTH EVERY DAY 09/02/19   Koberlein, Andris Flurry C, MD  triamcinolone cream (KENALOG) 0.1 % Apply 1 application to affected area topically 2 times daily. 01/02/20   [name], MD  TRUEPLUS INSULIN SYRINGE 31G X 5/16" 1 ML MISC Use pen needle with insulin 2 times daily 03/31/19   [name], MD    Allergies    Patient has no known allergies.  Review of Systems   Review of Systems  All other systems reviewed and are negative.   Physical Exam Updated Vital Signs There were no vitals taken for this visit.  Physical Exam Vitals  and nursing note reviewed.  Constitutional:      Appearance: He is well-developed.  HENT:     Head: Normocephalic and atraumatic.  Cardiovascular:     Rate and Rhythm: Normal rate and regular rhythm.     Heart sounds: No murmur.  Pulmonary:     Effort: Pulmonary effort is normal. No respiratory distress.     Breath sounds: Normal breath sounds.  Abdominal:     Palpations: Abdomen is soft.     Tenderness: There is no abdominal tenderness. There is no guarding  or rebound.  Musculoskeletal:        General: No tenderness.     Comments: Trace edema to the right lower extremity  Skin:    General: Skin is warm and dry.  Neurological:     Mental Status: He is alert.     Comments: Drowsy but arouses to verbal stimuli. Dysarthric speech. There is 4/5 strength in the left upper and left lower extremity. Contraction to the right upper extremity. 3/5 strength in the right lower extremity.  Psychiatric:        Behavior: Behavior normal.     ED Results / Procedures / Treatments   Labs (all labs ordered are listed, but only abnormal results are displayed) Labs Reviewed  SARS CORONAVIRUS 2 BY RT PCR (HOSPITAL ORDER, [place] [facility])  PROTIME-INR  APTT  CBC  DIFFERENTIAL  COMPREHENSIVE METABOLIC PANEL  I-STAT CHEM 8, ED  CBG MONITORING, ED    EKG None  Radiology No results found.  Procedures Procedures (including critical care time)  Medications Ordered in ED Medications  sodium chloride flush (NS) 0.9 % injection 3 mL (has no administration in time range)    ED Course  I have reviewed the triage vital signs and the nursing notes.  Pertinent labs & imaging results that were available during my care of the patient were reviewed by me and considered in my medical decision making (see chart for details).    MDM Rules/Calculators/A&P                     Patient presented to the emergency department as a code stroke, evaluated by neurology on his ED  arrival. CT head without acute abnormality. Patient's mental status did continue to improve during his ED stay but he has remained persistently confused. Labs with stable renal insufficiency. He was treated empirically with Keppra for possible seizure by neurology. Given his prolonged altered mental status hospitalist consulted for admission.  Pt updated of findings of studies and recommendation for admission and he is in agreement with treatment plan.    Final Clinical Impression(s) / ED Diagnoses Final diagnoses:  None    Rx / DC Orders ED Discharge Orders    None       Quintella Reichert, MD 02/14/20 0009

## 2020-02-13 NOTE — H&P (Signed)
History and Physical   [name] DPO:[id number] DOB: 27-May-1956 DOA: 02/13/2020  Referring MD/NP/PA: Dr. Ralene Bathe  PCP: [name], MD   Outpatient Specialists: None  Patient coming from: Home  Chief Complaint: Altered mental status  HPI: [name] is a 64 y.o. male with medical history significant of diabetes, osteoarthritis, hypertension, CKD 5, neuropathies, previous CVA with residual right-sided weakness, migraine headaches who was brought in as a code stroke with altered mental status and left-sided weakness.  Patient is not able to give any history at this point.  He was seen and evaluated by neurology and the code stroke team.  Patient was girlfriend apparently left him sitting on the porch when he asked her to get him a drink.  When she came back he was unresponsive.  She called EMS that made him unresponsive.  Brought him to the emergency room.  He is still not responding to any words or commands.  He is therefore being admitted to the hospital for work-up of CVA.  Patient's initial work-up including head CT were negative.  Awaiting MRI and further evaluation.  He was noted to have mild left sided weakness..  ED Course: Temperature 97.6 blood pressure 116/84 pulse 77 respirate of 19 oxygen sat 100% on room air.  Sodium 140 potassium 3.8 chloride 112 CO2 108 BUN is 55 creatinine is 11.4 and calcium 1.04.  Glucose 108.  Chest x-ray showed no acute findings.  Head CT without contrast shows no acute findings.  MRI ordered and patient being admitted for further work-up.  Review of Systems: As per HPI otherwise 10 point review of systems negative.    Past Medical History:  Diagnosis Date  . Arthritis   . Diabetes mellitus, type II, insulin dependent ([place])   . GERD (gastroesophageal reflux disease)   . Hypertension   . Hypertensive emergency 05/21/2012  . Migraines   . Neuropathy in diabetes ([place])    bilat feet  . Ruptured lumbar disc   . Stroke [facility]) 2013    residual right sided weakness (arm>leg)    Past Surgical History:  Procedure Laterality Date  . KNEE ARTHROSCOPY Bilateral      reports that he has been smoking cigarettes. He has been smoking about 0.50 packs per day. He has never used smokeless tobacco. He reports current drug use. Drug: Marijuana. He reports that he does not drink alcohol.  No Known Allergies  Family History  Problem Relation Age of Onset  . Other Mother   . Stroke Father   . Hypertension Father   . Epilepsy Father   . Breast cancer Sister   . Stroke Brother   . Heart attack Brother   . Diabetes Sister      Prior to Admission medications   Medication Sig Start Date End Date Taking? Authorizing Provider  ACCU-CHEK AVIVA PLUS test strip Use 1 test strip to test blood sugar 2-3 times daily 04/20/18   [provider]  ACCU-CHEK AVIVA PLUS test strip USE 1 strip TO test blood sugar 2 TIMES DAILY TO 3 TIMES DAILY 06/10/19   Koberlein, Andris Flurry C, MD  amLODipine (NORVASC) 10 MG tablet Take 1 tablet (10 mg total) by mouth daily. 07/06/19   [name], MD  blood glucose meter kit and supplies KIT Dispense based on patient and insurance preference. Check blood sugar 4 times a day 06/11/18   [name], MD  calcitRIOL (ROCALTROL) 0.25 MCG capsule  10/04/19   [provider]  chlorhexidine (PERIDEX) 0.12 % solution Swish with 20ms for 30 seconds then spit may use 2 times daily 06/01/18   [provider]  chlorthalidone (HYGROTON) 25 MG tablet TAKE 1 TABLET BY MOUTH EVERY DAY 07/27/19   Koberlein, JSteele Berg MD  Cholecalciferol (VITAMIN D) 50 MCG (2000 UT) tablet TAKE 1 TABLET BY MOUTH EVERY MORNING 01/03/19   Koberlein, Junell C, MD  cloNIDine (CATAPRES) 0.2 MG tablet TAKE 1 TABLET BY MOUTH 2 TIMES DAILY 12/07/19   Koberlein, JAndris FlurryC, MD  clopidogrel (PLAVIX) 75 MG tablet TAKE 1 TABLET BY MOUTH EVERY DAY 06/11/19   Koberlein, JAndris FlurryC, MD  clotrimazole (LOTRIMIN) 1 % cream Apply 1  application topically 2 (two) times daily. 11/01/18   KCaren Macadam MD  diphenhydrAMINE (BENADRYL) 50 MG tablet Take 0.5-1 tablets (25-50 mg total) by mouth 2 (two) times daily as needed for itching. 01/19/19   KCaren Macadam MD  famotidine (PEPCID) 20 MG tablet Take 1 tablet (20 mg total) by mouth 2 (two) times daily. 08/10/19   KCaren Macadam MD  furosemide (LASIX) 40 MG tablet Take 1 tablet (40 mg total) by mouth daily. 06/22/19   Burchette, BAlinda Sierras MD  insulin aspart protamine - aspart (NOVOLOG MIX 70/30 FLEXPEN) (70-30) 100 UNIT/ML FlexPen INJECT 28 UNITS INTO THE SKIN EVERY MORNING 12/01/19   Koberlein, JSteele Berg MD  insulin aspart protamine- aspart (NOVOLOG MIX 70/30) (70-30) 100 UNIT/ML injection Inject 0.15 mLs (15 Units total) into the skin at bedtime. 06/11/18   Koberlein, JSteele Berg MD  Insulin Pen Needle (SURE COMFORT PEN NEEDLES) 31G X 8 MM MISC Use as directed twice daily with Novolog flex pen 02/10/19   KCaren Macadam MD  NARCAN 4 MG/0.1ML LIQD nasal spray kit  05/31/18   [provider]  Oxycodone HCl 20 MG TABS  05/31/18   [provider]  pantoprazole (PROTONIX) 40 MG tablet TAKE 1 TABLET BY MOUTH EVERY DAY 01/23/20   KCaren Macadam MD  potassium chloride SA (KLOR-CON) 20 MEQ tablet  12/07/19   [provider]  pravastatin (PRAVACHOL) 80 MG tablet TAKE 1 TABLET BY MOUTH AT BEDTIME 03/30/19   Koberlein, Junell C, MD  sildenafil (REVATIO) 20 MG tablet Take 1-5 tablets by mouth as needed 01/24/20   Koberlein, JAndris FlurryC, MD  tamsulosin (FLOMAX) 0.4 MG CAPS capsule TAKE 1 CAPSULE BY MOUTH EVERY DAY 09/02/19   Koberlein, JAndris FlurryC, MD  triamcinolone cream (KENALOG) 0.1 % Apply 1 application to affected area topically 2 times daily. 01/02/20   KCaren Macadam MD  TRUEPLUS INSULIN SYRINGE 31G X 5/16" 1 ML MISC Use pen needle with insulin 2 times daily 03/31/19   KCaren Macadam MD    Physical Exam: Vitals:   02/13/20 1845 02/13/20 1900  02/13/20 1928  BP:  (!) 169/84   Pulse: 77 73   Resp: 19 16   Temp:   97.6 F (36.4 C)  TempSrc:   Oral  SpO2: 100% 100%       Constitutional: Confused and lethargic not communicating Vitals:   02/13/20 1845 02/13/20 1900 02/13/20 1928  BP:  (!) 169/84   Pulse: 77 73   Resp: 19 16   Temp:   97.6 F (36.4 C)  TempSrc:   Oral  SpO2: 100% 100%    Eyes: PERRL, lids and conjunctivae normal ENMT: Mucous membranes are dry. Posterior pharynx clear of any exudate or lesions.Normal dentition.  Neck: normal, supple, no masses, no thyromegaly Respiratory:  clear to auscultation bilaterally, no wheezing, no crackles. Normal respiratory effort. No accessory muscle use.  Cardiovascular: Regular rate and rhythm, no murmurs / rubs / gallops. No extremity edema. 2+ pedal pulses. No carotid bruits.  Abdomen: no tenderness, no masses palpated. No hepatosplenomegaly. Bowel sounds positive.  Musculoskeletal: no clubbing / cyanosis. No joint deformity upper and lower extremities. Good ROM, no contractures. Normal muscle tone.  Skin: no rashes, lesions, ulcers. No induration Neurologic: CN 2-12 grossly intact. Sensation intact, DTR normal. Strength 5/5 in all 4. Moves all extremities Psychiatric: Obtunded, not communicating, arousable   Labs on Admission: I have personally reviewed following labs and imaging studies  CBC: Recent Labs  Lab 02/13/20 1721 02/13/20 1737  WBC 5.4  --   NEUTROABS 4.3  --   HGB 10.5* 10.9*  HCT 32.4* 32.0*  MCV 93.4  --   PLT 141*  --    Basic Metabolic Panel: Recent Labs  Lab 02/13/20 1721 02/13/20 1737  NA 141 140  K 3.9 3.8  CL 111 112*  CO2 13*  --   GLUCOSE 113* 108*  BUN 64* 55*  CREATININE 10.90* 11.40*  CALCIUM 8.4*  --    GFR: CrCl cannot be calculated (Unknown ideal weight.). Liver Function Tests: Recent Labs  Lab 02/13/20 1721  AST 12*  ALT 10  ALKPHOS 89  BILITOT 0.7  PROT 6.3*  ALBUMIN 3.0*   No results for input(s): LIPASE,  AMYLASE in the last 168 hours. No results for input(s): AMMONIA in the last 168 hours. Coagulation Profile: Recent Labs  Lab 02/13/20 1721  INR 1.0   Cardiac Enzymes: No results for input(s): CKTOTAL, CKMB, CKMBINDEX, TROPONINI in the last 168 hours. BNP (last 3 results) Recent Labs    06/22/19 1505  PROBNP 170.0*   HbA1C: No results for input(s): HGBA1C in the last 72 hours. CBG: Recent Labs  Lab 02/13/20 1725 02/13/20 1825  GLUCAP 110* 106*   Lipid Profile: No results for input(s): CHOL, HDL, LDLCALC, TRIG, CHOLHDL, LDLDIRECT in the last 72 hours. Thyroid Function Tests: No results for input(s): TSH, T4TOTAL, FREET4, T3FREE, THYROIDAB in the last 72 hours. Anemia Panel: No results for input(s): VITAMINB12, FOLATE, FERRITIN, TIBC, IRON, RETICCTPCT in the last 72 hours. Urine analysis:    Component Value Date/Time   COLORURINE YELLOW 03/18/2017 1656   APPEARANCEUR CLEAR 03/18/2017 1656   LABSPEC 1.024 03/18/2017 1656   PHURINE 5.5 03/18/2017 1656   GLUCOSEU >=500 (A) 03/18/2017 1656   HGBUR NEGATIVE 03/18/2017 1656   BILIRUBINUR NEGATIVE 03/18/2017 1656   KETONESUR NEGATIVE 03/18/2017 1656   PROTEINUR 30 (A) 03/18/2017 1656   UROBILINOGEN 0.2 11/29/2014 2003   NITRITE NEGATIVE 03/18/2017 1656   LEUKOCYTESUR NEGATIVE 03/18/2017 1656   Sepsis Labs: _0 (procalcitonin:4,lacticidven:4) )No results found for this or any previous visit (from the past 240 hour(s)).   Radiological Exams on Admission: CT HEAD CODE STROKE WO CONTRAST  Result Date: 02/13/2020 CLINICAL DATA:  Code stroke.  Unresponsive. EXAM: CT HEAD WITHOUT CONTRAST TECHNIQUE: Contiguous axial images were obtained from the base of the skull through the vertex without intravenous contrast. COMPARISON:  01/23/2018 FINDINGS: The study is mildly motion degraded. Brain: No acute cortically based infarct, intracranial hemorrhage, mass, midline shift, or significant extra-axial fluid collection is  identified. Patchy hypodensities in the cerebral white matter bilaterally have progressed from the prior study and are nonspecific but compatible with moderate chronic small vessel ischemic disease. A chronic infarct is again noted in the left corona radiata and external  capsule. The ventricles are within normal limits for age. Focally enlarged extra-axial CSF space anteriorly over the left frontal lobe is unchanged. Vascular: Calcified atherosclerosis at the skull base. No hyperdense vessel. Skull: No fracture or suspicious osseous lesion. Sinuses/Orbits: Old medial left orbital fracture. The visualized paranasal sinuses and mastoid air cells are clear. Other: None. ASPECTS [facility] Stroke Program Early CT Score) Not scored given lack of provided localizing symptoms. IMPRESSION: 1. No evidence of acute intracranial abnormality. 2. Moderate chronic small vessel ischemic disease, progressed from 2019. These results were communicated to Dr. Lorraine Lax at 5:47 pm on 02/13/2020 by text page via the [facility] messaging system. Electronically Signed   By: Logan Bores M.D.   On: 02/13/2020 17:48    EKG: Independently reviewed. It shows sinus rhythm with a rate of 90 right atrial enlargement left ventricular hypertrophy by voltage criteria.  Assessment/Plan Principal Problem:   AMS (altered mental status) Active Problems:   Chronic kidney disease, stage 4 (severe) (HCC)   Tobacco abuse   Type 2 diabetes mellitus (HCC)   Hyperlipidemia   Hypertension   GERD (gastroesophageal reflux disease)     #1 altered mental status: Sudden nature suggested possible CVA or TIA at least. Seizure disorder possible in the past and was previous history of CVA. He is currently still out of it. Metabolic cause is possible but the sudden nature makes it less likely. We will admit him complete work-up for CVA. Nephrology as well as neurology will continue to follow.  #2 chronic kidney disease stage IV: Patient is advanced on close to  needing dialysis. Creatinine of more than 11. He is not uremic and not acidotic. Also potassium normal. No urgency for acute dialysis. We will consult nephrology.  #3 hyperlipidemia: Resume on continue statin  #4 hypertension: Continue home regimen when able to take p.o.'s.  #5 diabetes: Sliding scale insulin. Monitor closely.  #6 tobacco abuse: We will add nicotine patch. Continue to monitor    DVT prophylaxis: Heparin Code Status: Full code Family Communication: Girlfriend who gave history Disposition Plan: To be determined Consults called: Dr. Lorraine Lax, neurology, Dr. Moshe Cipro nephrology Admission status: Inpatient  Severity of Illness: The appropriate patient status for this patient is INPATIENT. Inpatient status is judged to be reasonable and necessary in order to provide the required intensity of service to ensure the patient's safety. The patient's presenting symptoms, physical exam findings, and initial radiographic and laboratory data in the context of their chronic comorbidities is felt to place them at high risk for further clinical deterioration. Furthermore, it is not anticipated that the patient will be medically stable for discharge from the hospital within 2 midnights of admission. The following factors support the patient status of inpatient.   " The patient's presenting symptoms include altered mental status. " The worrisome physical exam findings include confusion. " The initial radiographic and laboratory data are worrisome because of no significant findings. " The chronic co-morbidities include chronic kidney disease stage III with previous CVA.   * I certify that at the point of admission it is my clinical judgment that the patient will require inpatient hospital care spanning beyond 2 midnights from the point of admission due to high intensity of service, high risk for further deterioration and high frequency of surveillance required.Barbette Merino MD [facility] Pager [phone]  If 7PM-7AM, please contact night-coverage [email or web address] Password [facility]  02/13/2020, 8:12 PM

## 2020-02-13 NOTE — ED Notes (Signed)
Pt taken to MRI; c/o RLQ pain, mildly tender. Dr.Rees assessed pt prior to going to MRI

## 2020-02-13 NOTE — ED Triage Notes (Signed)
EMS called to Pt home by girl friend for CVA.  Per EMS Pt was unresponsive on assessment at Pt home.During transport EMS reported Pt started to arouse to voice and was saying some words. Pt arrived at ED at 1708 . EMS vitals CBG 120, BP 157/85, HR 77, 100% RA. resp 18. Temp 97.3.

## 2020-02-14 ENCOUNTER — Inpatient Hospital Stay (HOSPITAL_COMMUNITY): Payer: Medicare HMO

## 2020-02-14 ENCOUNTER — Inpatient Hospital Stay (HOSPITAL_COMMUNITY): Admission: RE | Admit: 2020-02-14 | Payer: Medicare HMO | Source: Ambulatory Visit

## 2020-02-14 ENCOUNTER — Encounter: Payer: Medicare HMO | Admitting: Vascular Surgery

## 2020-02-14 DIAGNOSIS — R569 Unspecified convulsions: Secondary | ICD-10-CM

## 2020-02-14 DIAGNOSIS — G459 Transient cerebral ischemic attack, unspecified: Secondary | ICD-10-CM

## 2020-02-14 LAB — ECHOCARDIOGRAM COMPLETE: Weight: 2412.8 oz

## 2020-02-14 LAB — LIPID PANEL
Cholesterol: 147 mg/dL (ref 0–200)
HDL: 31 mg/dL — ABNORMAL LOW (ref >40)
LDL Cholesterol: 91 mg/dL (ref 0–99)
Total CHOL/HDL Ratio: 4.7 RATIO
Triglycerides: 125 mg/dL (ref <150)
VLDL: 25 mg/dL (ref 0–40)

## 2020-02-14 LAB — CBC
HCT: 27.9 % — ABNORMAL LOW (ref 39.0–52.0)
Hemoglobin: 9.3 g/dL — ABNORMAL LOW (ref 13.0–17.0)
MCH: 30.5 pg (ref 26.0–34.0)
MCHC: 33.3 g/dL (ref 30.0–36.0)
MCV: 91.5 fL (ref 80.0–100.0)
Platelets: 175 10*3/uL (ref 150–400)
RBC: 3.05 MIL/uL — ABNORMAL LOW (ref 4.22–5.81)
RDW: 15.2 % (ref 11.5–15.5)
WBC: 4.7 10*3/uL (ref 4.0–10.5)
nRBC: 0.4 % — ABNORMAL HIGH (ref 0.0–0.2)

## 2020-02-14 LAB — VITAMIN B12: Vitamin B-12: 232 pg/mL (ref 180–914)

## 2020-02-14 LAB — AMMONIA: Ammonia: 27 umol/L (ref 9–35)

## 2020-02-14 LAB — COMPREHENSIVE METABOLIC PANEL
ALT: 11 U/L (ref 0–44)
AST: 20 U/L (ref 15–41)
Albumin: 2.7 g/dL — ABNORMAL LOW (ref 3.5–5.0)
Alkaline Phosphatase: 83 U/L (ref 38–126)
Anion gap: 11 (ref 5–15)
BUN: 64 mg/dL — ABNORMAL HIGH (ref 8–23)
CO2: 17 mmol/L — ABNORMAL LOW (ref 22–32)
Calcium: 8.2 mg/dL — ABNORMAL LOW (ref 8.9–10.3)
Chloride: 113 mmol/L — ABNORMAL HIGH (ref 98–111)
Creatinine, Ser: 10.98 mg/dL — ABNORMAL HIGH (ref 0.61–1.24)
GFR calc Af Amer: 5 mL/min — ABNORMAL LOW (ref >60)
GFR calc non Af Amer: 4 mL/min — ABNORMAL LOW (ref >60)
Glucose, Bld: 108 mg/dL — ABNORMAL HIGH (ref 70–99)
Potassium: 3.3 mmol/L — ABNORMAL LOW (ref 3.5–5.1)
Sodium: 141 mmol/L (ref 135–145)
Total Bilirubin: 0.6 mg/dL (ref 0.3–1.2)
Total Protein: 5.8 g/dL — ABNORMAL LOW (ref 6.5–8.1)

## 2020-02-14 LAB — HEMOGLOBIN A1C
Hgb A1c MFr Bld: 5.2 % (ref 4.8–5.6)
Mean Plasma Glucose: 102.54 mg/dL

## 2020-02-14 LAB — GLUCOSE, CAPILLARY
Glucose-Capillary: 57 mg/dL — ABNORMAL LOW (ref 70–99)
Glucose-Capillary: 125 mg/dL — ABNORMAL HIGH (ref 70–99)
Glucose-Capillary: 64 mg/dL — ABNORMAL LOW (ref 70–99)
Glucose-Capillary: 114 mg/dL — ABNORMAL HIGH (ref 70–99)
Glucose-Capillary: 111 mg/dL — ABNORMAL HIGH (ref 70–99)
Glucose-Capillary: 118 mg/dL — ABNORMAL HIGH (ref 70–99)
Glucose-Capillary: 130 mg/dL — ABNORMAL HIGH (ref 70–99)

## 2020-02-14 LAB — TSH: TSH: 0.875 u[IU]/mL (ref 0.350–4.500)

## 2020-02-14 MED ORDER — SENNOSIDES-DOCUSATE SODIUM 8.6-50 MG PO TABS: 2.0000 | ORAL_TABLET | Freq: Every evening | ORAL | Status: DC | PRN

## 2020-02-14 MED ORDER — ENOXAPARIN SODIUM 30 MG/0.3ML ~~LOC~~ SOLN
30.0000 mg | SUBCUTANEOUS | Status: DC
  Administered 2020-02-15 – 2020-02-16 (×2): 30 mg via SUBCUTANEOUS
  Filled 2020-02-14 (×2): qty 0.3

## 2020-02-14 MED ORDER — AMLODIPINE BESYLATE 10 MG PO TABS
10.0000 mg | ORAL_TABLET | Freq: Every day | ORAL | Status: DC
  Administered 2020-02-14 – 2020-02-16 (×3): 10 mg via ORAL
  Filled 2020-02-14 (×3): qty 1

## 2020-02-14 MED ORDER — HYDRALAZINE HCL 20 MG/ML IJ SOLN: 10.0000 mg | INTRAMUSCULAR | Status: DC | PRN

## 2020-02-14 MED ORDER — CALCITRIOL 0.25 MCG PO CAPS
0.2500 ug | ORAL_CAPSULE | Freq: Every day | ORAL | Status: DC
  Administered 2020-02-14 – 2020-02-16 (×3): 0.25 ug via ORAL
  Filled 2020-02-14 (×5): qty 1

## 2020-02-14 MED ORDER — POLYETHYLENE GLYCOL 3350 17 G PO PACK: 17.0000 g | PACK | Freq: Every day | ORAL | Status: DC | PRN

## 2020-02-14 MED ORDER — DEXTROSE 50 % IV SOLN
1.0000 | Freq: Once | INTRAVENOUS | Status: AC
  Administered 2020-02-14: 50 mL via INTRAVENOUS
  Filled 2020-02-14: qty 50

## 2020-02-14 MED ORDER — CLONIDINE HCL 0.2 MG PO TABS
0.2000 mg | ORAL_TABLET | Freq: Two times a day (BID) | ORAL | Status: DC
  Administered 2020-02-14 – 2020-02-16 (×5): 0.2 mg via ORAL
  Filled 2020-02-14 (×5): qty 1

## 2020-02-14 MED ORDER — POTASSIUM CHLORIDE CRYS ER 20 MEQ PO TBCR
20.0000 meq | EXTENDED_RELEASE_TABLET | Freq: Once | ORAL | Status: AC
  Administered 2020-02-14: 20 meq via ORAL
  Filled 2020-02-14: qty 1

## 2020-02-14 MED ORDER — DEXTROSE-NACL 5-0.45 % IV SOLN: INTRAVENOUS | Status: AC

## 2020-02-14 NOTE — Progress Notes (Addendum)
NEUROLOGY PROGRESS NOTE   Subjective: Patient is much more alert and oriented today.  Able to follow commands.  Still very confused about how he got here.  He does state that he will drink every once in a while but does not drink on a regular basis.  He states that this is never occurred to him before.  Denies any head trauma, birth difficulties or seizures in the past.  Exam: Vitals:   02/14/20 0514 02/14/20 0550  BP:  (!) 172/88  Pulse: 77 69  Resp: 17   Temp: 97.8 F (36.6 C)   SpO2: 100%     Neuro:  Mental Status: Alert, oriented to [facility], continues to be very confused about what happened.  Speech fluent without evidence of aphasia.  Able to follow simple commands without difficulty. Cranial Nerves: II:  Visual fields grossly normal,  III,IV, VI: ptosis not present, extra-ocular motions intact bilaterally pupils equal, round, reactive to light and accommodation V,VII: Right facial droop, facial light touch sensation normal bilaterally VIII: hearing normal bilaterally Motor: 5/5 strength on his left arm and left leg.  Right arm he can lift off the bed but is contracted.  Right leg is unable to move but does show muscle involvement. Sensory: Pinprick and light touch intact throughout, bilaterally Deep Tendon Reflexes: 2+ and symmetric throughout Plantars: Right: Upgoing   left: downgoing Cerebellar: normal finger-to-nose with left hand shows no dysmetria   Medications:  Scheduled: .  stroke: mapping our early stages of recovery book   Does not apply Once  . amLODipine  10 mg Oral Daily  . calcitRIOL  0.25 mcg Oral Daily  . cloNIDine  0.2 mg Oral BID  . enoxaparin (LOVENOX) injection  40 mg Subcutaneous Q24H  . insulin aspart  0-5 Units Subcutaneous QHS  . insulin aspart  0-9 Units Subcutaneous TID WC  . potassium chloride  20 mEq Oral Once  . sodium chloride flush  3 mL Intravenous Once    Pertinent Labs/Diagnostics: LDL 91 Hemoglobin A1c 5.2 BUN  64 Creatinine 10.98 Carotid Dopplers pending Echocardiogram pending EEG pending  [name]  Result Date: 02/13/2020  IMPRESSION: 1. No acute intracranial abnormality. 2. Old left basal ganglia small vessel infarct. 3. Mild generalized atrophy and chronic ischemic microangiopathy. Electronically Signed   By: Ulyses Jarred M.D.   On: 02/13/2020 21:12    CT HEAD CODE STROKE WO CONTRAST  Result Date: 02/13/2020 IMPRESSION: 1. No evidence of acute intracranial abnormality. 2. Moderate chronic small vessel ischemic disease, progressed from 2019. These results were communicated to Dr. Lorraine Lax at 5:47 pm on 02/13/2020 by text page via the [facility] messaging system. Electronically Signed   By: Logan Bores M.D.   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  Patient was brought to [facility] as code stroke due to sudden onset of unresponsiveness.  Patient does not recall any of the event.  During code stroke patient became more responsive and started to follow commands.  CT head was negative for acute processes.  MRI brain was negative for acute processes.  Patient currently is on Keppra 500 mg twice daily and tolerating well.  Exam today shows significant improvement in mentation and ability to communicate.  At this point he is back to baseline however is still very confused about what happened.  I believe most likely patient has suffered from a complex partial seizure, as he does have history of strokes which could be source of the event.  Impression:  -New onset complex partial seizure  Recommendations: -Continue seizure precautions -Continue Keppra 500 mg twice daily -We will need outpatient neurology follow-up -Per [facility] statutes, patients with seizures are not allowed to drive until   they have been seizure-free for six months. Use caution when using heavy equipment or power tools. Avoid working on ladders or at heights. Take showers instead of baths. Ensure the water temperature is not too high on the home water heater. Do not go swimming alone. When caring for infants or small children, sit down when holding, feeding, or changing them to minimize risk of injury to the child in the event you have a seizure.   Also, Maintain good sleep hygiene. Avoid alcohol.     02/14/2020, 1:31 PM  NEUROHOSPITALIST ADDENDUM Performed a face to face diagnostic evaluation.   I have reviewed the contents of history and physical exam as documented by PA/ARNP/Resident and agree with above documentation.  I have discussed and formulated the above plan as documented. Edits to the note have been made as needed.  Patient presents with sudden onset unresponsiveness as code stroke, however started to become more responsive on arrival. MRI brain negative for acute stroke. Suspect patient likely had complex partial seizure, given prior stroke would recommend continuing Keppra 500 mg twice daily. EEG completed: Official results still pending.  Patient will need outpatient neurology follow-up in 2 to 4 weeks    Raeann Offner MD [facility] [phone]   If 7pm to 7am, please call on call as listed on AMION.

## 2020-02-14 NOTE — Procedures (Signed)
Echo attempted. Patient care in progress. Will attempt again.

## 2020-02-14 NOTE — Evaluation (Signed)
Clinical/Bedside Swallow Evaluation Patient Details  Name: Richard [name] MRN: [id number] Date of Birth: [birth year]  Today's Date: 02/14/2020 Time: SLP Start Time (ACUTE ONLY): 9518 SLP Stop Time (ACUTE ONLY): 0805 SLP Time Calculation (min) (ACUTE ONLY): 13 min  Past Medical History:  Past Medical History:  Diagnosis Date  . Arthritis   . Diabetes mellitus, type II, insulin dependent ([place])   . GERD (gastroesophageal reflux disease)   . Hypertension   . Hypertensive emergency 05/21/2012  . Migraines   . Neuropathy in diabetes ([place])    bilat feet  . Ruptured lumbar disc   . Stroke [facility]) 2013   residual right sided weakness (arm>leg)   Past Surgical History:  Past Surgical History:  Procedure Laterality Date  . KNEE ARTHROSCOPY Bilateral    HPI:  Pt is a 64 yo male presenting with AMS. MRI negative for acute findings. PMH includes: CVA with residual R hemiparesis/contracture, DM, GERD, HTN, migraines, neuropathy. SLP speech/language evaluation in 2013 [name].    Assessment / Plan / Recommendation Clinical Impression  Pt's speech sounds very mildly dysarthric and hypernasal, but he describes this to be his baseline and the is no significant weakness noted during oral motor exam. He describes feeling symptomatic of GERD frequently PTA. Pt had a little throat clearing initially while drinking thin liquids in a suboptimal position, but once repositioned more fully upright, no further s/s of aspiration were noted. Pt may need assistance with meal set-up to facilitate safety (upright position, cutting larger pieces of food), but would continue with regular texture solids and thin liquids. Will f/u briefly for tolerance in light of AMS.  SLP Visit Diagnosis: Dysphagia, unspecified (R13.10)    Aspiration Risk  Mild aspiration risk    Diet Recommendation Regular;Thin liquid   Liquid Administration via: Cup;Straw Medication Administration: Whole meds with liquid Supervision: Staff  to assist with self feeding;Intermittent supervision to cue for compensatory strategies;Comment(set-up assist) Compensations: Minimize environmental distractions;Slow rate;Small sips/bites Postural Changes: Seated upright at 90 degrees;Remain upright for at least 30 minutes after po intake    Other  Recommendations Oral Care Recommendations: Oral care BID   Follow up Recommendations (tba)      Frequency and Duration min 2x/week  2 weeks       Prognosis Prognosis for Safe Diet Advancement: Good Barriers to Reach Goals: Cognitive deficits      Swallow Study   General HPI: Pt is a 64 yo male presenting with AMS. MRI negative for acute findings. PMH includes: CVA with residual R hemiparesis/contracture, DM, GERD, HTN, migraines, neuropathy. SLP speech/language evaluation in 2013 [facility].  Type of Study: Bedside Swallow Evaluation Previous Swallow Assessment: none in chart Diet Prior to this Study: Regular;Thin liquids Temperature Spikes Noted: No Respiratory Status: Room air History of Recent Intubation: No Behavior/Cognition: Alert;Cooperative;Confused;Requires cueing Oral Cavity Assessment: Within Functional Limits Oral Care Completed by SLP: No Oral Cavity - Dentition: Adequate natural dentition;Missing dentition Vision: Functional for self-feeding Self-Feeding Abilities: Able to feed self;Needs set up Patient Positioning: Upright in bed Baseline Vocal Quality: Normal;Other (comment)(speech mildly hypernasal, says its his baseline) Volitional Swallow: Able to elicit    Oral/Motor/Sensory Function Overall Oral Motor/Sensory Function: Within functional limits   Ice Chips Ice chips: Not tested   Thin Liquid Thin Liquid: Impaired Presentation: Cup;Self Fed;Straw Pharyngeal  Phase Impairments: Throat Clearing - Immediate    Nectar Thick Nectar Thick Liquid: Not tested   Honey Thick Honey Thick Liquid: Not tested   Puree Puree: Within functional limits Presentation: Self  Fed;Spoon    Solid     Solid: Within functional limits Presentation: Self Fed       Osie Bond., M.A. [place] Pager [phone] Office [phone]  02/14/2020,8:26 AM

## 2020-02-14 NOTE — NC FL2 (Signed)
[place] LEVEL OF CARE SCREENING TOOL     IDENTIFICATION  Patient Name: Richard [name] Birthdate: Mar 20, 1956 Sex: male Admission Date (Current Location): 02/13/2020  [place] and [place] Number:  Kathleen Argue [phone] [place] and Address:  [facility], [place] [address], [place], [place] [address]      Provider Number: [id number]  Attending Physician Name and Address:  Damita Lack, MD  Relative Name and Phone Number:       Current Level of Care: Hospital Recommended Level of Care: [place] Prior Approval Number:    Date Approved/Denied:   PASRR Number: [id number]  Discharge Plan: SNF    Current Diagnoses: Patient Active Problem List   Diagnosis Date Noted  . AMS (altered mental status) 02/13/2020  . Neuropathy in diabetes ([place])   . Migraines   . Hypertension   . GERD (gastroesophageal reflux disease)   . Diabetes mellitus, type II, insulin dependent ([place])   . Arthritis   . Stroke ([place]) 08/26/2012  . Hyperlipidemia 08/25/2012  . Chronic kidney disease, stage 4 (severe) ([place]) 08/23/2012  . Tobacco abuse 08/23/2012  . Protein-calorie malnutrition, mild ([place]) 08/23/2012  . Type 2 diabetes mellitus ([place]) 08/23/2012  . 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 05/21/2012    Orientation RESPIRATION BLADDER Height & Weight     Self, Place  Normal Incontinent Weight: 150 lb 12.8 oz (68.4 kg) Height:     BEHAVIORAL SYMPTOMS/MOOD NEUROLOGICAL BOWEL NUTRITION STATUS      Incontinent Diet(see discharge summary)  AMBULATORY STATUS COMMUNICATION OF NEEDS Skin   Extensive Assist Verbally Normal                       Personal Care Assistance Level of Assistance  Bathing, Feeding, Dressing Bathing Assistance: Maximum assistance Feeding assistance: Independent Dressing Assistance: Maximum assistance     Functional Limitations Info  Sight, Hearing, Speech Sight Info:  Adequate Hearing Info: Adequate Speech Info: Impaired    SPECIAL CARE FACTORS FREQUENCY  OT (By licensed OT), PT (By licensed PT)     PT Frequency: 5x week OT Frequency: 5x week            Contractures Contractures Info: Not present    Additional Factors Info  Code Status, Allergies, Insulin Sliding Scale Code Status Info: Full Code Allergies Info: No Known Allergies   Insulin Sliding Scale Info: insulin aspart (novoLOG) injection 0-5 Units daily at bedtime; insulin aspart (novoLOG) injection 0-9 Units 3x daily with meals       Current Medications (02/14/2020):  This is the current hospital active medication list Current Facility-Administered Medications  Medication Dose Route Frequency Provider Last Rate Last Admin  .  stroke: mapping our early stages of recovery book   Does not apply Once Elwyn Reach, MD      . acetaminophen (TYLENOL) tablet 650 mg  650 mg Oral Q4H PRN Elwyn Reach, MD       Or  . acetaminophen (TYLENOL) 160 MG/5ML solution 650 mg  650 mg Per Tube Q4H PRN Elwyn Reach, MD       Or  . acetaminophen (TYLENOL) suppository 650 mg  650 mg Rectal Q4H PRN Gala Romney L, MD      . amLODipine (NORVASC) tablet 10 mg  10 mg Oral Daily Justin Mend, MD   10 mg at 02/14/20 1650  . calcitRIOL (ROCALTROL) capsule 0.25 mcg  0.25 mcg Oral Daily Johnney Ou,  Jonna Coup, MD   0.25 mcg at 02/14/20 1630  . cloNIDine (CATAPRES) tablet 0.2 mg  0.2 mg Oral BID Justin Mend, MD   0.2 mg at 02/14/20 1649  . dextrose 5 %-0.45 % sodium chloride infusion   Intravenous Continuous Damita Lack, MD 75 mL/hr at 02/14/20 1036 New Bag at 02/14/20 1036  . [START ON 02/15/2020] enoxaparin (LOVENOX) injection 30 mg  30 mg Subcutaneous Q24H Amin, Ankit Chirag, MD      . hydrALAZINE (APRESOLINE) injection 10 mg  10 mg Intravenous Q4H PRN Sharion Settler, NP      . insulin aspart (novoLOG) injection 0-5 Units  0-5 Units Subcutaneous QHS Garba, Mohammad L, MD      .  insulin aspart (novoLOG) injection 0-9 Units  0-9 Units Subcutaneous TID WC Garba, Mohammad L, MD      . levETIRAcetam (KEPPRA) IVPB 500 mg/100 mL premix  500 mg Intravenous Q12H Marliss Coots, PA-C 400 mL/hr at 02/14/20 1725 500 mg at 02/14/20 1725  . polyethylene glycol (MIRALAX / GLYCOLAX) packet 17 g  17 g Oral Daily PRN Amin, Ankit Chirag, MD      . senna-docusate (Senokot-S) tablet 2 tablet  2 tablet Oral QHS PRN Amin, Ankit Chirag, MD      . sodium chloride flush (NS) 0.9 % injection 3 mL  3 mL Intravenous Once Tegeler, Gwenyth Allegra, MD         Discharge Medications: Please see discharge summary for a list of discharge medications.  Relevant Imaging Results:  Relevant Lab Results:   Additional Information SS# [place], [place]

## 2020-02-14 NOTE — Progress Notes (Signed)
Occupational Therapy Evaluation Patient Details Name: Richard [name] MRN: [id number] DOB: 04-25-1956 Today's Date: 02/14/2020    History of Present Illness Pt is a 64 yo male presenting with AMS. MRI negative for acute findings. PMH includes: CVA with residual R hemiparesis/contracture, DM, GERD, HTN, migraines, neuropathy   Clinical Impression   Pt is a poor historian secondary to confusion/disorientation. Pt reports that prior to admission, he was Mod I for BADLs and received min assist from girlfriend/fiance for more complex ADLs/IADLs. Pt reports mostly using cane at home for ambulation. Pt continuously repeated, "You know, I am just trying to figure out how I got here." All members of the team have attempted to explain his current situation to him. Pt admitted for above and is limited by decreased memory, decreased balance, decreased coordination, and decreased safety awareness. Today, OT assessed bed mobility, dressing, grooming, toileting, functional mobility/transfers. Pt soiled in large BM upon arrival in bed, pt not aware of accident. Pt requires min assist for bed mobility (hand placement), min assist-min guard for functional transfers using RW (would benefit more from quad cane), min-mod assist for dressing, total assist for toileting hygiene, and setup for grooming. Pt does not understand severity of deficits secondary to confusion. Pt would benefit from continued OT services to address ADLs/functional mobility. Pt agreeable to continuing rehab at a SNF before returning home. Will continue to follow pt acutely as able.         Follow Up Recommendations  SNF;Supervision/Assistance - 24 hour    Equipment Recommendations  Tub/shower bench;3 in 1 bedside commode;Other (comment)(grabs bars for bathroom)    Recommendations for Other Services       Precautions / Restrictions Precautions Precautions: Fall Precaution Comments: pt reports he has fallen at home Restrictions Weight  Bearing Restrictions: No      Mobility Bed Mobility Overal bed mobility: Needs Assistance Bed Mobility: Rolling;Sidelying to Sit Rolling: Min assist Sidelying to sit: Min assist;HOB elevated       General bed mobility comments: pt able to sit at EOB without trunk support  Transfers Overall transfer level: Needs assistance Equipment used: Rolling walker (2 wheeled)(would benefit more from using quad cane) Transfers: Sit to/from Stand Sit to Stand: Min assist;From elevated surface         General transfer comment: pt required verbal cues for correct hand placement before standing    Balance Overall balance assessment: Needs assistance;History of Falls Sitting-balance support: No upper extremity supported;Feet supported Sitting balance-Leahy Scale: Fair Sitting balance - Comments: no LOB   Standing balance support: Bilateral upper extremity supported;During functional activity Standing balance-Leahy Scale: Poor Standing balance comment: min assist-min guard for standing balance and ambulation using RW; next time will utilize quad cane instead secondary to R side residual deficits                           ADL either performed or assessed with clinical judgement   ADL Overall ADL's : Needs assistance/impaired Eating/Feeding: Set up   Grooming: Wash/dry face;Set up;Sitting(primarily uses LUE )   Upper Body Bathing: Moderate assistance;Sitting   Lower Body Bathing: Moderate assistance;Sitting/lateral leans;Sit to/from stand   Upper Body Dressing : Minimal assistance   Lower Body Dressing: Maximal assistance   Toilet Transfer: Minimal assistance;Cueing for safety;Cueing for sequencing;Ambulation;BSC;RW   Toileting- Clothing Manipulation and Hygiene: Maximal assistance;Sitting/lateral lean;Sit to/from stand;Bed level   Tub/ Shower Transfer: Minimal assistance;Cueing for safety;Cueing for sequencing;Ambulation;Shower seat;Grab bars;Rolling walker  Functional mobility during ADLs: Min guard;Minimal assistance;Cueing for safety;Cueing for sequencing;Rolling walker(according to PT, pt would benefit most from quad cane) General ADL Comments: pt requires mod-max assist for most BADLs secondary to RUE residual deficits; pt has taught himself how to utilize his LUE functionally     Vision         Perception     Praxis      Pertinent Vitals/Pain Pain Assessment: No/denies pain Faces Pain Scale: Hurts little more Pain Location: RLE during repositioning Pain Descriptors / Indicators: Grimacing Pain Intervention(s): Monitored during session;Repositioned     Hand Dominance Left(was R handed before CVA)   Extremity/Trunk Assessment Upper Extremity Assessment Upper Extremity Assessment: Generalized weakness;RUE deficits/detail RUE: Unable to fully assess due to immobilization(residual deficits including hemiparesis/slight contracture) RUE Sensation: WNL RUE Coordination: decreased fine motor;decreased gross motor(utilizes L hand to manipulate RUE during self-care tasks )   Lower Extremity Assessment Lower Extremity Assessment: RLE deficits/detail RLE Deficits / Details: hip flex 3/5, ankle df 2/5, knee ext 3/5, increased tone noted RLE RLE Sensation: decreased light touch;decreased proprioception RLE Coordination: decreased gross motor;decreased fine motor   Cervical / Trunk Assessment Cervical / Trunk Assessment: Normal   Communication Communication Communication: No difficulties   Cognition Arousal/Alertness: Awake/alert Behavior During Therapy: WFL for tasks assessed/performed Overall Cognitive Status: Impaired/Different from baseline Area of Impairment: Orientation;Memory;Attention;Problem solving                 Orientation Level: Disoriented to;Situation;Time Current Attention Level: Sustained Memory: Decreased short-term memory       Problem Solving: Slow processing;Decreased initiation;Difficulty  sequencing;Requires verbal cues;Requires tactile cues General Comments: pt repeatedly asking staff how/why he got here in the hospital; does not recall any events leading up to hospital admission; attempted to explain pt's situation to him multiple times   General Comments  pt agreeable to participating in additional rehab before returning home    Exercises     Shoulder Instructions      Home Living Family/patient expects to be discharged to:: Private residence Living Arrangements: Spouse/significant other Available Help at Discharge: Available 24 hours/day;Friend(s) Type of Home: House Home Access: Level entry     Home Layout: One level     Bathroom Shower/Tub: Teacher, early years/pre: Standard Bathroom Accessibility: Yes How Accessible: Other (comment)(accessible via cane) Home Equipment: Wheelchair - [employer] - 2 wheels;Cane - single point(pt more confused than usual; need to follow up with family)   Additional Comments: pt reports living with his girlfriend/fiance who is home with him 24/7 to assist with ADLs/IADLs as needed  Lives With: Significant other    Prior Functioning/Environment Level of Independence: Independent with assistive device(s)        Comments: pt reports that he uses a cane most of the time and that he drives even though he's not supposed to        OT Problem List: Decreased strength;Decreased range of motion;Decreased activity tolerance;Impaired balance (sitting and/or standing);Decreased coordination;Decreased cognition;Decreased safety awareness;Impaired UE functional use      OT Treatment/Interventions: Self-care/ADL training;Therapeutic exercise;Neuromuscular education;Energy conservation;DME and/or AE instruction;Therapeutic activities;Cognitive remediation/compensation;Patient/family education    OT Goals(Current goals can be found in the care plan section) Acute Rehab OT Goals Patient Stated Goal: remember how he got here   OT Frequency: Min 2X/week   Barriers to D/C:            Co-evaluation              AM-PAC OT "6 Clicks"  Daily Activity     Outcome Measure Help from another person eating meals?: None Help from another person taking care of personal grooming?: A Little Help from another person toileting, which includes using toliet, bedpan, or urinal?: A Lot Help from another person bathing (including washing, rinsing, drying)?: A Lot Help from another person to put on and taking off regular upper body clothing?: A Little Help from another person to put on and taking off regular lower body clothing?: A Lot 6 Click Score: 16   End of Session Equipment Utilized During Treatment: Gait belt;Rolling walker Nurse Communication: Mobility status  Activity Tolerance: Patient tolerated treatment well;Other (comment)(pt limited by confusion) Patient left: in chair;with call bell/phone within reach;with chair alarm set;with nursing/sitter in room  OT Visit Diagnosis: Unsteadiness on feet (R26.81);Repeated falls (R29.6);Muscle weakness (generalized) (M62.81)                Time: 9758-8325 OT Time Calculation (min): 63 min Charges:  OT General Charges $OT Visit: 1 Visit OT Evaluation $OT Eval Moderate Complexity: 1 Mod OT Treatments $Self Care/Home Management : 38-52 mins  Michel Bickers, OTR/L [facility] [phone]  Francesca Jewett 02/14/2020, 2:52 PM

## 2020-02-14 NOTE — Progress Notes (Signed)
PROGRESS NOTE    Richard [name]  BTD:[id number] DOB: 10-25-1955 DOA: 02/13/2020 PCP: [name], MD   Brief Narrative:  64 y.o. male with medical history significant of diabetes, osteoarthritis, hypertension, CKD 5, neuropathies, previous CVA with residual right-sided weakness, migraine headaches who was brought in as a code stroke with altered mental status and left-sided weakness.  Patient is not able to give any history at this point.  He was seen and evaluated by neurology and the code stroke team.    CT head and MRI brain was negative for any acute pathology.  His mentation returned to baseline the next morning.  Seen by PT/OT who recommended SNF.  TSH, B12, ammonia levels were within normal limits.   Assessment & Plan:   Principal Problem:   AMS (altered mental status) Active Problems:   Chronic kidney disease, stage 4 (severe) (HCC)   Tobacco abuse   Type 2 diabetes mellitus (HCC)   Hyperlipidemia   Hypertension   GERD (gastroesophageal reflux disease)   Acute metabolic encephalopathy, unclear etiology CT head, MRI brain negative.  TSH, ammonia, B12 within normal limits.  PT/OT is recommending SNF.  Unclear etiology.  Mentation back to baseline at the moment. Suspicion for seizures, EEG-pending Keppra 500 mg twice daily Seizure precautions. Neuro following.     CKD stage IV Creatinine around 11.0, making urine. Nephro Consulted.   HLD -Currently awaiting pharmacy to complete med rec  Essential HTN -IV hydralazine as needed.  Pharmacy to complete med rec.  DM2 -Accu-Cheks and sliding scale  Tobacco use -Counseled to quit using this.   DVT prophylaxis: Lovenox Code Status: Full code Family Communication:  None  Status is: Inpatient  Remains inpatient appropriate because:Ongoing diagnostic testing needed not appropriate for outpatient work up   Dispo: The patient is from: Home              Anticipated d/c is to: SNF              Anticipated  d/c date is: 2 days              Patient currently is not medically stable to d/c. Ongoing evaluation by urology nephrology.  Hopefully discharge over next 24 to 48 hours to SNF.     Subjective: Overall appears to be doing slightly better compared to yesterday.  He is answering basic questions appropriately.  Cannot recall all the events from yesterday.  Review of Systems Otherwise negative except as per HPI, including: General: Denies fever, chills, night sweats or unintended weight loss. Resp: Denies cough, wheezing, shortness of breath. Cardiac: Denies chest pain, palpitations, orthopnea, paroxysmal nocturnal dyspnea. GI: Denies abdominal pain, nausea, vomiting, diarrhea or constipation GU: Denies dysuria, frequency, hesitancy or incontinence MS: Denies muscle aches, joint pain or swelling Neuro: Denies headache, neurologic deficits (focal weakness, numbness, tingling), abnormal gait Psych: Denies anxiety, depression, SI/HI/AVH Skin: Denies new rashes or lesions ID: Denies sick contacts, exotic exposures, travel  Examination:  General exam: Appears calm and comfortable  Respiratory system: Clear to auscultation. Respiratory effort normal. Cardiovascular system: S1 & S2 heard, RRR. No JVD, murmurs, rubs, gallops or clicks. No pedal edema. Gastrointestinal system: Abdomen is nondistended, soft and nontender. No organomegaly or masses felt. Normal bowel sounds heard. Central nervous system: Alert and oriented. No focal neurological deficits. Extremities: Symmetric 5 x 5 power. Skin: No rashes, lesions or ulcers Psychiatry: Judgement and insight appear Poor.  Mood & affect appropriate.   Objective: Vitals:   02/13/20 2242  02/13/20 2351 02/14/20 0514 02/14/20 0550  BP: (!) 144/75 (!) 166/67  (!) 172/88  Pulse: 77 85 77 69  Resp: 15 17 17    Temp:  97.9 F (36.6 C) 97.8 F (36.6 C)   TempSrc:  Oral Oral   SpO2: 100% 100% 100%   Weight:  68.4 kg      Intake/Output Summary  (Last 24 hours) at 02/14/2020 1315 Last data filed at 02/14/2020 1114 Gross per 24 hour  Intake 100 ml  Output 500 ml  Net -400 ml   Filed Weights   02/13/20 2351  Weight: 68.4 kg     Data Reviewed:   CBC: Recent Labs  Lab 02/13/20 1721 02/13/20 1737 02/14/20 0934  WBC 5.4  --  4.7  NEUTROABS 4.3  --   --   HGB 10.5* 10.9* 9.3*  HCT 32.4* 32.0* 27.9*  MCV 93.4  --  91.5  PLT 141*  --  416   Basic Metabolic Panel: Recent Labs  Lab 02/13/20 1721 02/13/20 1737 02/14/20 0934  NA 141 140 141  K 3.9 3.8 3.3*  CL 111 112* 113*  CO2 13*  --  17*  GLUCOSE 113* 108* 108*  BUN 64* 55* 64*  CREATININE 10.90* 11.40* 10.98*  CALCIUM 8.4*  --  8.2*   GFR: Estimated Creatinine Clearance: 6.7 mL/min (A) (by C-G formula based on SCr of 10.98 mg/dL (H)). Liver Function Tests: Recent Labs  Lab 02/13/20 1721 02/14/20 0934  AST 12* 20  ALT 10 11  ALKPHOS 89 83  BILITOT 0.7 0.6  PROT 6.3* 5.8*  ALBUMIN 3.0* 2.7*   No results for input(s): LIPASE, AMYLASE in the last 168 hours. Recent Labs  Lab 02/14/20 0931  AMMONIA 27   Coagulation Profile: Recent Labs  Lab 02/13/20 1721  INR 1.0   Cardiac Enzymes: No results for input(s): CKTOTAL, CKMB, CKMBINDEX, TROPONINI in the last 168 hours. BNP (last 3 results) Recent Labs    06/22/19 1505  PROBNP 170.0*   HbA1C: Recent Labs    02/14/20 0934  HGBA1C 5.2   CBG: Recent Labs  Lab 02/14/20 0417 02/14/20 0524 02/14/20 0758 02/14/20 0927 02/14/20 1216  GLUCAP 57* 125* 64* 114* 111*   Lipid Profile: Recent Labs    02/14/20 0934  CHOL 147  HDL 31*  LDLCALC 91  TRIG 125  CHOLHDL 4.7   Thyroid Function Tests: Recent Labs    02/14/20 0931  TSH 0.875   Anemia Panel: Recent Labs    02/14/20 0931  VITAMINB12 232   Sepsis Labs: No results for input(s): PROCALCITON, LATICACIDVEN in the last 168 hours.  Recent Results (from the past 240 hour(s))  SARS Coronavirus 2 by RT PCR (hospital order,  performed in [facility] hospital lab) Nasopharyngeal Nasopharyngeal Swab     Status: None   Collection Time: 02/13/20  5:21 PM   Specimen: Nasopharyngeal Swab  Result Value Ref Range Status   SARS Coronavirus 2 NEGATIVE NEGATIVE Final    Comment: (NOTE) SARS-CoV-2 target nucleic acids are NOT DETECTED. The SARS-CoV-2 RNA is generally detectable in upper and lower respiratory specimens during the acute phase of infection. The lowest concentration of SARS-CoV-2 viral copies this assay can detect is 250 copies / mL. A negative result does not preclude SARS-CoV-2 infection and should not be used as the sole basis for treatment or other patient management decisions.  A negative result may occur with improper specimen collection / handling, submission of specimen other than nasopharyngeal swab, presence of  viral mutation(s) within the areas targeted by this assay, and inadequate number of viral copies (<250 copies / mL). A negative result must be combined with clinical observations, patient history, and epidemiological information. Fact Sheet for Patients:   StrictlyIdeas.no Fact Sheet for Healthcare Providers: BankingDealers.co.za This test is not yet approved or cleared  by the [place] FDA and has been authorized for detection and/or diagnosis of SARS-CoV-2 by FDA under an Emergency Use Authorization (EUA).  This EUA will remain in effect (meaning this test can be used) for the duration of the COVID-19 declaration under Section 564(b)(1) of the Act, 21 U.S.C. section 360bbb-3(b)(1), unless the authorization is terminated or revoked sooner. Performed at [facility], [place] [address], [place], [place] [address]          Radiology Studies: [name]  Result Date: 02/13/2020 CLINICAL DATA:  Encephalopathy EXAM: MRI HEAD WITHOUT CONTRAST TECHNIQUE: Multiplanar, multiecho pulse sequences of the brain and surrounding  structures were obtained without intravenous contrast. COMPARISON:  08/24/2012 FINDINGS: Brain: No acute infarct, acute hemorrhage or extra-axial collection. Multifocal white matter hyperintensity, most commonly due to chronic ischemic microangiopathy. Old left basal ganglia small vessel infarct. Left frontal pole arachnoid cyst. Mild generalized atrophy. Single focus of left cerebellar chronic microhemorrhage. Normal midline structures. Vascular: Normal flow voids. Skull and upper cervical spine: Normal marrow signal. Sinuses/Orbits: Negative. Other: None. IMPRESSION: 1. No acute intracranial abnormality. 2. Old left basal ganglia small vessel infarct. 3. Mild generalized atrophy and chronic ischemic microangiopathy. Electronically Signed   By: Ulyses Jarred M.D.   On: 02/13/2020 21:12   DG Chest Port 1 View  Result Date: 02/13/2020 CLINICAL DATA:  Altered mental status EXAM: PORTABLE CHEST 1 VIEW COMPARISON:  06/22/2019 FINDINGS: Borderline to mild cardiomegaly. No edema, pleural effusion, pneumothorax or focal airspace disease IMPRESSION: No active disease.  Borderline to mild cardiomegaly Electronically Signed   By: Donavan Foil M.D.   On: 02/13/2020 20:10   CT HEAD CODE STROKE WO CONTRAST  Result Date: 02/13/2020 CLINICAL DATA:  Code stroke.  Unresponsive. EXAM: CT HEAD WITHOUT CONTRAST TECHNIQUE: Contiguous axial images were obtained from the base of the skull through the vertex without intravenous contrast. COMPARISON:  01/23/2018 FINDINGS: The study is mildly motion degraded. Brain: No acute cortically based infarct, intracranial hemorrhage, mass, midline shift, or significant extra-axial fluid collection is identified. Patchy hypodensities in the cerebral white matter bilaterally have progressed from the prior study and are nonspecific but compatible with moderate chronic small vessel ischemic disease. A chronic infarct is again noted in the left corona radiata and external capsule. The ventricles  are within normal limits for age. Focally enlarged extra-axial CSF space anteriorly over the left frontal lobe is unchanged. Vascular: Calcified atherosclerosis at the skull base. No hyperdense vessel. Skull: No fracture or suspicious osseous lesion. Sinuses/Orbits: Old medial left orbital fracture. The visualized paranasal sinuses and mastoid air cells are clear. Other: None. ASPECTS [facility] Stroke Program Early CT Score) Not scored given lack of provided localizing symptoms. IMPRESSION: 1. No evidence of acute intracranial abnormality. 2. Moderate chronic small vessel ischemic disease, progressed from 2019. These results were communicated to Dr. Lorraine Lax at 5:47 pm on 02/13/2020 by text page via the [facility] messaging system. Electronically Signed   By: Logan Bores M.D.   On: 02/13/2020 17:48        Scheduled Meds: .  stroke: mapping our early stages of recovery book   Does not apply Once  . enoxaparin (LOVENOX)  injection  40 mg Subcutaneous Q24H  . insulin aspart  0-5 Units Subcutaneous QHS  . insulin aspart  0-9 Units Subcutaneous TID WC  . sodium chloride flush  3 mL Intravenous Once   Continuous Infusions: . dextrose 5 % and 0.45% NaCl 75 mL/hr at 02/14/20 1036  . levETIRAcetam 500 mg (02/14/20 0757)     LOS: 1 day   Time spent= 35 mins    Marjorie Deprey Arsenio Loader, MD [facility]  If 7PM-7AM, please contact night-coverage  02/14/2020, 1:15 PM

## 2020-02-14 NOTE — Consult Note (Signed)
[facility]  INPATIENT CONSULTATION  Reason for Consultation: CKD Requesting Provider: Dr. Reesa Chew  HPI: Richard [name] is an 64 y.o. 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  He had an acute mental status change yesterday afternoon and was brought in by EMS.  CT unrevealing, MRI also without acute abnormality.  He was afebrile, normotensive. BUN 55, Cr 11.4, hb 10.5, WBC 5.4.   Seen at bedside and he feels back to himself.  No recollection of events leading up to hospitalization.   He has been followed with Dr. Carolin Sicks at [facility] - last visit was in 09/2019 at which time Cr 6+, GFR 9.  He missed his follow up.  Had been referred for permanent vascular access but didn't follow through.  He expressed desire for 2nd opinion re: CKD but hasn't pursued.  Says no HD unless 'life or death' situation.   Denies uremic symptoms including dysgeusia, hiccups, pruritis.  Denies voiding difficulties and edema. No NSAIDs.  PMH: Past Medical History:  Diagnosis Date  . Arthritis   . Diabetes mellitus, type II, insulin dependent ([place])   . GERD (gastroesophageal reflux disease)   . Hypertension   . Hypertensive emergency 05/21/2012  . Migraines   . Neuropathy in diabetes ([place])    bilat feet  . Ruptured lumbar disc   . Stroke [facility]) 2013   residual right sided weakness (arm>leg)   PSH: Past Surgical History:  Procedure Laterality Date  . KNEE ARTHROSCOPY Bilateral     Past Medical History:  Diagnosis Date  . Arthritis   . Diabetes mellitus, type II, insulin dependent ([place])   . GERD (gastroesophageal reflux disease)   . Hypertension   . Hypertensive emergency 05/21/2012  . Migraines   . Neuropathy in diabetes ([place])    bilat feet  . Ruptured lumbar disc   . Stroke [name]) 2013   residual right sided weakness (arm>leg)    Medications:  I have reviewed the patient's current  medications.  Medications Prior to Admission  Medication Sig Dispense Refill  . ACCU-CHEK AVIVA PLUS test strip 1 each by Other route See admin instructions. Use 1 test strip to test blood sugar two-three times daily  1  . ACCU-CHEK AVIVA PLUS test strip USE 1 strip TO test blood sugar 2 TIMES DAILY TO 3 TIMES DAILY (Patient taking differently: 1 each by Other route in the morning, at noon, and at bedtime. ) 100 strip 1  . amLODipine (NORVASC) 10 MG tablet Take 1 tablet (10 mg total) by mouth daily. 90 tablet 1  . blood glucose meter kit and supplies KIT Dispense based on patient and insurance preference. Check blood sugar 4 times a day (Patient taking differently: Inject 1 each into the skin See admin instructions. Dispense based on patient and insurance preference. Check blood sugar 4 times a day) 1 each 0  . calcitRIOL (ROCALTROL) 0.25 MCG capsule Take 0.25 mcg by mouth daily.     . chlorhexidine (PERIDEX) 0.12 % solution Use as directed 15 mLs in the mouth or throat See admin instructions. Swish with 82ms for 30 seconds then spit. May use two times daily.  2  . chlorthalidone (HYGROTON) 25 MG tablet TAKE 1 TABLET BY MOUTH EVERY DAY (Patient taking differently: Take 25 mg by mouth daily. ) 90 tablet 0  . Cholecalciferol (VITAMIN D) 50 MCG (2000 UT) tablet TAKE 1 TABLET BY MOUTH  EVERY MORNING (Patient taking differently: Take 2,000 Units by mouth in the morning. ) 90 tablet 1  . cloNIDine (CATAPRES) 0.2 MG tablet TAKE 1 TABLET BY MOUTH 2 TIMES DAILY (Patient taking differently: Take 0.2 mg by mouth 2 (two) times daily. ) 180 tablet 0  . clopidogrel (PLAVIX) 75 MG tablet TAKE 1 TABLET BY MOUTH EVERY DAY (Patient taking differently: Take 75 mg by mouth daily. ) 90 tablet 1  . clotrimazole (LOTRIMIN) 1 % cream Apply 1 application topically 2 (two) times daily. 30 g 0  . diphenhydrAMINE (BENADRYL) 50 MG tablet Take 0.5-1 tablets (25-50 mg total) by mouth 2 (two) times daily as needed for itching. 30  tablet 0  . famotidine (PEPCID) 20 MG tablet Take 1 tablet (20 mg total) by mouth 2 (two) times daily. 60 tablet 0  . furosemide (LASIX) 40 MG tablet Take 1 tablet (40 mg total) by mouth daily. 30 tablet 3  . insulin aspart protamine - aspart (NOVOLOG MIX 70/30 FLEXPEN) (70-30) 100 UNIT/ML FlexPen INJECT 28 UNITS INTO THE SKIN EVERY MORNING (Patient taking differently: Inject 28 Units into the skin in the morning. ) 15 mL 0  . insulin aspart protamine- aspart (NOVOLOG MIX 70/30) (70-30) 100 UNIT/ML injection Inject 0.15 mLs (15 Units total) into the skin at bedtime. 10 mL 11  . Insulin Pen Needle (SURE COMFORT PEN NEEDLES) 31G X 8 MM MISC Use as directed twice daily with Novolog flex pen (Patient taking differently: 1 each by Other route See admin instructions. Use as directed twice daily with Novolog flex pen) 100 each 3  . NARCAN 4 MG/0.1ML LIQD nasal spray kit Place 0.4 mg into the nose once.     . Oxycodone HCl 20 MG TABS Take 20 mg by mouth every 6 (six) hours as needed (pain).     . pantoprazole (PROTONIX) 40 MG tablet TAKE 1 TABLET BY MOUTH EVERY DAY (Patient taking differently: Take 40 mg by mouth daily. ) 90 tablet 0  . potassium chloride SA (KLOR-CON) 20 MEQ tablet Take 20 mEq by mouth daily.     . pravastatin (PRAVACHOL) 80 MG tablet TAKE 1 TABLET BY MOUTH AT BEDTIME (Patient taking differently: Take 80 mg by mouth at bedtime. ) 90 tablet 1  . sildenafil (REVATIO) 20 MG tablet Take 1-5 tablets by mouth as needed (Patient taking differently: Take 20-100 mg by mouth as needed. ) 90 tablet 0  . tamsulosin (FLOMAX) 0.4 MG CAPS capsule TAKE 1 CAPSULE BY MOUTH EVERY DAY (Patient taking differently: Take 0.4 mg by mouth daily. ) 90 capsule 1  . triamcinolone cream (KENALOG) 0.1 % Apply 1 application to affected area topically 2 times daily. 30 g 0  . TRUEPLUS INSULIN SYRINGE 31G X 5/16" 1 ML MISC Use pen needle with insulin 2 times daily (Patient taking differently: 1 each by Other route in the  morning and at bedtime. With insulin) 100 each 1    ALLERGIES:  No Known Allergies  FAM HX: Family History  Problem Relation Age of Onset  . Other Mother   . Stroke Father   . Hypertension Father   . Epilepsy Father   . Breast cancer Sister   . Stroke Brother   . Heart attack Brother   . Diabetes Sister     Social History:   reports that he has been smoking cigarettes. He has been smoking about 0.50 packs per day. He has never used smokeless tobacco. He reports current drug use. Drug: Marijuana. He  reports that he does not drink alcohol.  ROS: 12 system ROS neg except per HPI above.  Blood pressure (!) 172/88, pulse 69, temperature 97.8 F (36.6 C), temperature source Oral, resp. rate 17, weight 68.4 kg, SpO2 100 %. PHYSICAL EXAM: Gen: seated in bedside chair eating lunch  Eyes:  anciteric ENT: MMM Neck: supple, no JVD CV:  RRR, no rub Abd:  soft Lungs: normal WOB, no rales GU: no foley Extr:  No edema Neuro: no asterixis on R, L hemiparesis secondary to old CVA, per his report deficit unchanged   Results for orders placed or performed during the hospital encounter of 02/13/20 (from the past 48 hour(s))  SARS Coronavirus 2 by RT PCR (hospital order, performed in [place] hospital lab) Nasopharyngeal Nasopharyngeal Swab     Status: None   Collection Time: 02/13/20  5:21 PM   Specimen: Nasopharyngeal Swab  Result Value Ref Range   SARS Coronavirus 2 NEGATIVE NEGATIVE    Comment: (NOTE) SARS-CoV-2 target nucleic acids are NOT DETECTED. The SARS-CoV-2 RNA is generally detectable in upper and lower respiratory specimens during the acute phase of infection. The lowest concentration of SARS-CoV-2 viral copies this assay can detect is 250 copies / mL. A negative result does not preclude SARS-CoV-2 infection and should not be used as the sole basis for treatment or other patient management decisions.  A negative result may occur with improper specimen collection /  handling, submission of specimen other than nasopharyngeal swab, presence of viral mutation(s) within the areas targeted by this assay, and inadequate number of viral copies (<250 copies / mL). A negative result must be combined with clinical observations, patient history, and epidemiological information. Fact Sheet for Patients:   StrictlyIdeas.no Fact Sheet for Healthcare Providers: BankingDealers.co.za This test is not yet approved or cleared  by the [place] FDA and has been authorized for detection and/or diagnosis of SARS-CoV-2 by FDA under an Emergency Use Authorization (EUA).  This EUA will remain in effect (meaning this test can be used) for the duration of the COVID-19 declaration under Section 564(b)(1) of the Act, 21 U.S.C. section 360bbb-3(b)(1), unless the authorization is terminated or revoked sooner. Performed at [facility], [place] [address] [place], [place] [address]   Protime-INR     Status: None   Collection Time: 02/13/20  5:21 PM  Result Value Ref Range   Prothrombin Time 12.6 11.4 - 15.2 seconds   INR 1.0 0.8 - 1.2    Comment: (NOTE) INR goal varies based on device and disease states. Performed at [facility], [place] [address], [place], [place] [address]   APTT     Status: None   Collection Time: 02/13/20  5:21 PM  Result Value Ref Range   aPTT 31 24 - 36 seconds    Comment: Performed at [place] [address] [place], [place] [address]  CBC     Status: Abnormal   Collection Time: 02/13/20  5:21 PM  Result Value Ref Range   WBC 5.4 4.0 - 10.5 K/uL   RBC 3.47 (L) 4.22 - 5.81 MIL/uL   Hemoglobin 10.5 (L) 13.0 - 17.0 g/dL   HCT 32.4 (L) 39.0 - 52.0 %   MCV 93.4 80.0 - 100.0 fL   MCH 30.3 26.0 - 34.0 pg   MCHC 32.4 30.0 - 36.0 g/dL   RDW 15.4 11.5 - 15.5 %   Platelets 141 (L) 150 - 400 K/uL   nRBC 0.0 0.0 -  0.2 %    Comment: Performed at [facility], [place] [address], [place], [place] [address]  Differential     Status: Abnormal   Collection Time: 02/13/20  5:21 PM  Result Value Ref Range   Neutrophils Relative % 80 %   Neutro Abs 4.3 1.7 - 7.7 K/uL   Lymphocytes Relative 11 %   Lymphs Abs 0.6 (L) 0.7 - 4.0 K/uL   Monocytes Relative 8 %   Monocytes Absolute 0.5 0.1 - 1.0 K/uL   Eosinophils Relative 0 %   Eosinophils Absolute 0.0 0.0 - 0.5 K/uL   Basophils Relative 0 %   Basophils Absolute 0.0 0.0 - 0.1 K/uL   Immature Granulocytes 1 %   Abs Immature Granulocytes 0.03 0.00 - 0.07 K/uL    Comment: Performed at [place] [address], [place], [place] [address]  Comprehensive metabolic panel     Status: Abnormal   Collection Time: 02/13/20  5:21 PM  Result Value Ref Range   Sodium 141 135 - 145 mmol/L   Potassium 3.9 3.5 - 5.1 mmol/L   Chloride 111 98 - 111 mmol/L   CO2 13 (L) 22 - 32 mmol/L   Glucose, Bld 113 (H) 70 - 99 mg/dL    Comment: Glucose reference range applies only to samples taken after fasting for at least 8 hours.   BUN 64 (H) 8 - 23 mg/dL   Creatinine, Ser 10.90 (H) 0.61 - 1.24 mg/dL   Calcium 8.4 (L) 8.9 - 10.3 mg/dL   Total Protein 6.3 (L) 6.5 - 8.1 g/dL   Albumin 3.0 (L) 3.5 - 5.0 g/dL   AST 12 (L) 15 - 41 U/L   ALT 10 0 - 44 U/L   Alkaline Phosphatase 89 38 - 126 U/L   Total Bilirubin 0.7 0.3 - 1.2 mg/dL   GFR calc non Af Amer 4 (L) >60 mL/min   GFR calc Af Amer 5 (L) >60 mL/min   Anion gap 17 (H) 5 - 15    Comment: Performed at [facility], [place] [address] [place], [place] [address]  CBG monitoring, ED     Status: Abnormal   Collection Time: 02/13/20  5:25 PM  Result Value Ref Range   Glucose-Capillary 110 (H) 70 - 99 mg/dL    Comment: Glucose reference range applies only to samples taken after fasting for at least 8 hours.  I-stat chem 8, ED     Status: Abnormal   Collection Time: 02/13/20  5:37 PM  Result Value Ref Range   Sodium 140 135 - 145 mmol/L   Potassium 3.8 3.5 - 5.1 mmol/L   Chloride  112 (H) 98 - 111 mmol/L   BUN 55 (H) 8 - 23 mg/dL   Creatinine, Ser 11.40 (H) 0.61 - 1.24 mg/dL   Glucose, Bld 108 (H) 70 - 99 mg/dL    Comment: Glucose reference range applies only to samples taken after fasting for at least 8 hours.   Calcium, Ion 1.04 (L) 1.15 - 1.40 mmol/L   TCO2 14 (L) 22 - 32 mmol/L   Hemoglobin 10.9 (L) 13.0 - 17.0 g/dL   HCT 32.0 (L) 39.0 - 52.0 %  CBG monitoring, ED     Status: Abnormal   Collection Time: 02/13/20  6:25 PM  Result Value Ref Range   Glucose-Capillary 106 (H) 70 - 99 mg/dL    Comment: Glucose reference range applies only to samples taken after fasting for at least  8 hours.  Glucose, capillary     Status: Abnormal   Collection Time: 02/14/20  4:17 AM  Result Value Ref Range   Glucose-Capillary 57 (L) 70 - 99 mg/dL    Comment: Glucose reference range applies only to samples taken after fasting for at least 8 hours.  Glucose, capillary     Status: Abnormal   Collection Time: 02/14/20  5:24 AM  Result Value Ref Range   Glucose-Capillary 125 (H) 70 - 99 mg/dL    Comment: Glucose reference range applies only to samples taken after fasting for at least 8 hours.  Glucose, capillary     Status: Abnormal   Collection Time: 02/14/20  7:58 AM  Result Value Ref Range   Glucose-Capillary 64 (L) 70 - 99 mg/dL    Comment: Glucose reference range applies only to samples taken after fasting for at least 8 hours.    [name]  Result Date: 02/13/2020 CLINICAL DATA:  Encephalopathy EXAM: MRI HEAD WITHOUT CONTRAST TECHNIQUE: Multiplanar, multiecho pulse sequences of the brain and surrounding structures were obtained without intravenous contrast. COMPARISON:  08/24/2012 FINDINGS: Brain: No acute infarct, acute hemorrhage or extra-axial collection. Multifocal white matter hyperintensity, most commonly due to chronic ischemic microangiopathy. Old left basal ganglia small vessel infarct. Left frontal pole arachnoid cyst. Mild generalized atrophy. Single  focus of left cerebellar chronic microhemorrhage. Normal midline structures. Vascular: Normal flow voids. Skull and upper cervical spine: Normal marrow signal. Sinuses/Orbits: Negative. Other: None. IMPRESSION: 1. No acute intracranial abnormality. 2. Old left basal ganglia small vessel infarct. 3. Mild generalized atrophy and chronic ischemic microangiopathy. Electronically Signed   By: Ulyses Jarred M.D.   On: 02/13/2020 21:12   DG Chest Port 1 View  Result Date: 02/13/2020 CLINICAL DATA:  Altered mental status EXAM: PORTABLE CHEST 1 VIEW COMPARISON:  06/22/2019 FINDINGS: Borderline to mild cardiomegaly. No edema, pleural effusion, pneumothorax or focal airspace disease IMPRESSION: No active disease.  Borderline to mild cardiomegaly Electronically Signed   By: Donavan Foil M.D.   On: 02/13/2020 20:10   CT HEAD CODE STROKE WO CONTRAST  Result Date: 02/13/2020 CLINICAL DATA:  Code stroke.  Unresponsive. EXAM: CT HEAD WITHOUT CONTRAST TECHNIQUE: Contiguous axial images were obtained from the base of the skull through the vertex without intravenous contrast. COMPARISON:  01/23/2018 FINDINGS: The study is mildly motion degraded. Brain: No acute cortically based infarct, intracranial hemorrhage, mass, midline shift, or significant extra-axial fluid collection is identified. Patchy hypodensities in the cerebral white matter bilaterally have progressed from the prior study and are nonspecific but compatible with moderate chronic small vessel ischemic disease. A chronic infarct is again noted in the left corona radiata and external capsule. The ventricles are within normal limits for age. Focally enlarged extra-axial CSF space anteriorly over the left frontal lobe is unchanged. Vascular: Calcified atherosclerosis at the skull base. No hyperdense vessel. Skull: No fracture or suspicious osseous lesion. Sinuses/Orbits: Old medial left orbital fracture. The visualized paranasal sinuses and mastoid air cells are  clear. Other: None. ASPECTS [facility] Stroke Program Early CT Score) Not scored given lack of provided localizing symptoms. IMPRESSION: 1. No evidence of acute intracranial abnormality. 2. Moderate chronic small vessel ischemic disease, progressed from 2019. These results were communicated to Dr. Lorraine Lax at 5:47 pm on 02/13/2020 by text page via the [facility] messaging system. Electronically Signed   By: Logan Bores M.D.   On: 02/13/2020 17:48    Assessment/Plan **Transient AMS episode: no e/o CVA on imaging ? TIA;  seems to be back to baseline.  Not consistent with uremia. Per primary.    **CKD5: advanced CKD without uremic symptoms or volume overload.  Discussed his extremely low function with him, still not ready to proceed with vascular access.  I explained now is the time to proceed as he is looking at dialysis in the near future based on recent labs.  Encouraged him to seek second opinion of that's what he desires.  Expressed concern that if he doesn't follow through on these issues he's going to have to start dialysis in an emergent setting.    **Anemia of CKD:  Hb acceptable in 9s, check iron. Consider ESA prior to discharge.   **Metabolic acidosis:  Secondary to CKD. Start na bicarb po.  **Secondary hyperPTH:  Was supposed to be on calcitriol outpt but apparently didn't start.  Check phos, PTH here.    **HTN: resumed home meds, follow. No volume overload.   **DM, HL, tobacco abuse noted - per primary  Justin Mend 02/14/2020, 8:10 AM

## 2020-02-14 NOTE — Evaluation (Signed)
Speech Language Pathology Evaluation Patient Details Name: Richard [name] MRN: [id number] DOB: 20-Dec-1955 Today's Date: 02/14/2020 Time: 4854-6270 SLP Time Calculation (min) (ACUTE ONLY): 13 min  Problem List:  Patient Active Problem List   Diagnosis Date Noted  . AMS (altered mental status) 02/13/2020  . Neuropathy in diabetes ([place])   . Migraines   . Hypertension   . GERD (gastroesophageal reflux disease)   . Diabetes mellitus, type II, insulin dependent ([place])   . Arthritis   . Stroke ([place]) 08/26/2012  . Hyperlipidemia 08/25/2012  . Chronic kidney disease, stage 4 (severe) ([place]) 08/23/2012  . Tobacco abuse 08/23/2012  . Protein-calorie malnutrition, mild ([place]) 08/23/2012  . Type 2 diabetes mellitus ([place]) 08/23/2012  . 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 05/21/2012   Past Medical History:  Past Medical History:  Diagnosis Date  . Arthritis   . Diabetes mellitus, type II, insulin dependent ([place])   . GERD (gastroesophageal reflux disease)   . Hypertension   . Hypertensive emergency 05/21/2012  . Migraines   . Neuropathy in diabetes ([place])    bilat feet  . Ruptured lumbar disc   . Stroke [facility]) 2013   residual right sided weakness (arm>leg)   Past Surgical History:  Past Surgical History:  Procedure Laterality Date  . KNEE ARTHROSCOPY Bilateral    HPI:  Pt is a 64 yo male presenting with AMS. MRI negative for acute findings. PMH includes: CVA with residual R hemiparesis/contracture, DM, GERD, HTN, migraines, neuropathy. SLP speech/language evaluation in 2013 [facility].    Assessment / Plan / Recommendation Clinical Impression  Pt's mentation is altered from his baseline, given adequate function during previous SLP evaluation and pt report of needing minimal cognitive assistance at home. Today he is oriented x2 (person, location) and cannot be fully reoriented despite cues. He ruminates on wanting to know why he is in the hospital, how he got  there. Despite frequent reminders throughout session he does not retain this information, and this proves to be an internal distraction during other tasks. He is able to recall 0/4 words on a delayed recall task but when given multiple choices options his accuracy increases to 100%. Pt is not able to tell me any medications he takes at home. MRI was negative for acute findings this admission. Neurology note also indicates seizures as possible etiology for AMS. If this is the case, his mentation may continue to clear as he is here. For now, he will benefit from SLP f/u as w/u is still underway.     SLP Assessment  SLP Recommendation/Assessment: Patient needs continued Speech Lanaguage Pathology Services SLP Visit Diagnosis: Cognitive communication deficit (R41.841)    Follow Up Recommendations  24 hour supervision/assistance(tba)    Frequency and Duration min 2x/week  2 weeks      SLP Evaluation Cognition  Overall Cognitive Status: Impaired/Different from baseline Arousal/Alertness: Awake/alert Orientation Level: Oriented to person;Oriented to place;Disoriented to time;Disoriented to situation Attention: Sustained Sustained Attention: Impaired Sustained Attention Impairment: Verbal basic;Functional basic Memory: Impaired Memory Impairment: Retrieval deficit;Decreased recall of new information Awareness: Impaired Awareness Impairment: Intellectual impairment Problem Solving: Impaired Problem Solving Impairment: Verbal basic Safety/Judgment: Impaired       Comprehension  Auditory Comprehension Overall Auditory Comprehension: Appears within functional limits for tasks assessed    Expression Expression Primary Mode of Expression: Verbal Verbal Expression Overall Verbal Expression: Appears within functional limits for tasks assessed   Oral / Motor  Oral Motor/Sensory Function Overall Oral Motor/Sensory Function: Within functional  limits Motor Speech Overall Motor Speech: Impaired  at baseline   GO                     Osie Bond., M.A. [facility] Pager [phone] Office [phone]  02/14/2020, 8:33 AM

## 2020-02-14 NOTE — Progress Notes (Signed)
  Echocardiogram 2D Echocardiogram has been performed.  Richard [name] 02/14/2020, 4:36 PM

## 2020-02-14 NOTE — Evaluation (Signed)
Physical Therapy Evaluation Patient Details Name: Richard [name] MRN: [id number] DOB: 1955-09-29 Today's Date: 02/14/2020   History of Present Illness  Pt is a 64 yo male presenting with AMS. MRI negative for acute findings. PMH includes: CVA with residual R hemiparesis/contracture, DM, GERD, HTN, migraines, neuropathy    Clinical Impression  Pt admitted with above diagnosis. Pt amnesic to event that brought him in and he it not retaining new info. Several therapist have told him how he came to be here and he keeps asking the next clinician that comes in. Pt thought he could get up from chair but after 3 unsuccessful attempts, min A given to rise to standing. Pt ambulated 10' with mod progressing to min A and quad cane. He became very fatigued after first 40' and had difficulty returning to chair with slower movements and decreased verbalization. Apparently pt was independent prior to this event. Given current change and need for 24 hr supervision, recommending SNF at d/c.  Pt currently with functional limitations due to the deficits listed below (see PT Problem List). Pt will benefit from skilled PT to increase their independence and safety with mobility to allow discharge to the venue listed below.       Follow Up Recommendations SNF;Supervision/Assistance - 24 hour    Equipment Recommendations  Cane(quad cane)    Recommendations for Other Services       Precautions / Restrictions Precautions Precautions: Fall Precaution Comments: pt reports he has fallen at home Restrictions Weight Bearing Restrictions: No      Mobility  Bed Mobility               General bed mobility comments: pt received in recliner  Transfers Overall transfer level: Needs assistance Equipment used: Quad cane Transfers: Sit to/from Stand Sit to Stand: Min assist         General transfer comment: pt was certain he could get up on his own so let him attempt this 3x and he was unable to  achieve full standing and maintain balance. Min A needed for stability  Ambulation/Gait Ambulation/Gait assistance: Mod assist;Min assist Gait Distance (Feet): 20 Feet Assistive device: Quad cane Gait Pattern/deviations: Step-to pattern;Narrow base of support;Decreased weight shift to right;Decreased dorsiflexion - right;Decreased stance time - right;Steppage Gait velocity: decreased Gait velocity interpretation: <1.31 ft/sec, indicative of household ambulator General Gait Details: pt needed mod A initially to step LLE. Once he was going he was able to ambulate with min A. R hip hike compensation noted for decr R df. Pt became very fatigued after 10' and took standing rest break. Took double the time to walk second 10' back to chair.   Stairs            Wheelchair Mobility    Modified Rankin (Stroke Patients Only)       Balance Overall balance assessment: Needs assistance;History of Falls Sitting-balance support: No upper extremity supported;Feet supported Sitting balance-Leahy Scale: Fair     Standing balance support: No upper extremity supported Standing balance-Leahy Scale: Poor Standing balance comment: pt requires unilateral support to maintain balance as well as min A                             Pertinent Vitals/Pain Pain Assessment: No/denies pain    Home Living Family/patient expects to be discharged to:: Private residence Living Arrangements: Spouse/significant other Available Help at Discharge: Available 24 hours/day;Friend(s) Type of Home: House  Home Equipment: Kasandra Knudsen - single point Additional Comments: pt reports he lives with his fiancee who is home with him all of the time and can assist him. He cannot recall the details of his home such as STE. Per chart he has one level home with STE but that was taken in 2013    Prior Function Level of Independence: Independent with assistive device(s)         Comments: pt reports that he  uses a cane most of the time and that he drives even though he's not supposed to     Hand Dominance   Dominant Hand: Left(was right before CVA)    Extremity/Trunk Assessment   Upper Extremity Assessment Upper Extremity Assessment: Defer to OT evaluation    Lower Extremity Assessment Lower Extremity Assessment: RLE deficits/detail RLE Deficits / Details: hip flex 3/5, ankle df 2/5, knee ext 3/5, increased tone noted RLE RLE Sensation: decreased light touch;decreased proprioception RLE Coordination: decreased gross motor;decreased fine motor    Cervical / Trunk Assessment Cervical / Trunk Assessment: Normal  Communication   Communication: No difficulties  Cognition Arousal/Alertness: Awake/alert Behavior During Therapy: WFL for tasks assessed/performed Overall Cognitive Status: Impaired/Different from baseline Area of Impairment: Orientation;Memory;Attention;Problem solving                 Orientation Level: Time;Situation Current Attention Level: Sustained Memory: Decreased short-term memory       Problem Solving: Difficulty sequencing;Requires verbal cues General Comments: pt cannot remember details of his home or anything surrounding event bringing him here. In addition, he keeps asking people to tell him how he got here and both SLP and OT reviewed this with him and he didn't remember this during PT session and kept asking again      General Comments General comments (skin integrity, edema, etc.): spoke to pt about SNF for rehab and pt agreeable    Exercises     Assessment/Plan    PT Assessment Patient needs continued PT services  PT Problem List Decreased strength;Decreased activity tolerance;Decreased range of motion;Decreased balance;Decreased mobility;Decreased coordination;Decreased cognition;Decreased knowledge of precautions;Impaired tone       PT Treatment Interventions DME instruction;Gait training;Functional mobility training;Therapeutic  activities;Therapeutic exercise;Stair training;Balance training;Cognitive remediation;Patient/family education;Neuromuscular re-education    PT Goals (Current goals can be found in the Care Plan section)  Acute Rehab PT Goals Patient Stated Goal: remember how he got here PT Goal Formulation: With patient Time For Goal Achievement: 02/28/20 Potential to Achieve Goals: Good    Frequency Min 3X/week   Barriers to discharge        Co-evaluation               AM-PAC PT "6 Clicks" Mobility  Outcome Measure Help needed turning from your back to your side while in a flat bed without using bedrails?: A Little Help needed moving from lying on your back to sitting on the side of a flat bed without using bedrails?: A Little Help needed moving to and from a bed to a chair (including a wheelchair)?: A Lot Help needed standing up from a chair using your arms (e.g., wheelchair or bedside chair)?: A Lot Help needed to walk in hospital room?: A Lot Help needed climbing 3-5 steps with a railing? : Total 6 Click Score: 13    End of Session Equipment Utilized During Treatment: Gait belt Activity Tolerance: Patient tolerated treatment well Patient left: in chair;with call bell/phone within reach;with chair alarm set Nurse Communication: Mobility status PT Visit Diagnosis: Unsteadiness  on feet (R26.81);Difficulty in walking, not elsewhere classified (R26.2);Hemiplegia and hemiparesis Hemiplegia - Right/Left: Right Hemiplegia - dominant/non-dominant: Dominant Hemiplegia - caused by: Cerebral infarction    Time: 1884-1660 PT Time Calculation (min) (ACUTE ONLY): 29 min   Charges:   PT Evaluation $PT Eval Moderate Complexity: 1 Mod PT Treatments $Gait Training: 8-22 mins        Leighton Roach, PT  Acute Rehab Services  Pager [phone] Office [place] 02/14/2020, 2:00 PM

## 2020-02-14 NOTE — Progress Notes (Signed)
EEG completed, results pending. 

## 2020-02-15 ENCOUNTER — Inpatient Hospital Stay (HOSPITAL_COMMUNITY): Payer: Medicare HMO

## 2020-02-15 DIAGNOSIS — R404 Transient alteration of awareness: Secondary | ICD-10-CM

## 2020-02-15 DIAGNOSIS — N185 Chronic kidney disease, stage 5: Secondary | ICD-10-CM

## 2020-02-15 LAB — CBC
HCT: 25.8 % — ABNORMAL LOW (ref 39.0–52.0)
Hemoglobin: 8.5 g/dL — ABNORMAL LOW (ref 13.0–17.0)
MCH: 29.9 pg (ref 26.0–34.0)
MCHC: 32.9 g/dL (ref 30.0–36.0)
MCV: 90.8 fL (ref 80.0–100.0)
Platelets: 145 10*3/uL — ABNORMAL LOW (ref 150–400)
RBC: 2.84 MIL/uL — ABNORMAL LOW (ref 4.22–5.81)
RDW: 15.1 % (ref 11.5–15.5)
WBC: 4.2 10*3/uL (ref 4.0–10.5)
nRBC: 0 % (ref 0.0–0.2)

## 2020-02-15 LAB — IRON AND TIBC
Iron: 40 ug/dL — ABNORMAL LOW (ref 45–182)
Saturation Ratios: 22 % (ref 17.9–39.5)
TIBC: 181 ug/dL — ABNORMAL LOW (ref 250–450)
UIBC: 141 ug/dL

## 2020-02-15 LAB — MAGNESIUM: Magnesium: 1.4 mg/dL — ABNORMAL LOW (ref 1.7–2.4)

## 2020-02-15 LAB — RENAL FUNCTION PANEL
Albumin: 2.4 g/dL — ABNORMAL LOW (ref 3.5–5.0)
Anion gap: 12 (ref 5–15)
BUN: 64 mg/dL — ABNORMAL HIGH (ref 8–23)
CO2: 16 mmol/L — ABNORMAL LOW (ref 22–32)
Calcium: 7.7 mg/dL — ABNORMAL LOW (ref 8.9–10.3)
Chloride: 111 mmol/L (ref 98–111)
Creatinine, Ser: 10.21 mg/dL — ABNORMAL HIGH (ref 0.61–1.24)
GFR calc Af Amer: 6 mL/min — ABNORMAL LOW (ref >60)
GFR calc non Af Amer: 5 mL/min — ABNORMAL LOW (ref >60)
Glucose, Bld: 119 mg/dL — ABNORMAL HIGH (ref 70–99)
Phosphorus: 5.1 mg/dL — ABNORMAL HIGH (ref 2.5–4.6)
Potassium: 3.7 mmol/L (ref 3.5–5.1)
Sodium: 139 mmol/L (ref 135–145)

## 2020-02-15 LAB — GLUCOSE, CAPILLARY
Glucose-Capillary: 93 mg/dL (ref 70–99)
Glucose-Capillary: 97 mg/dL (ref 70–99)
Glucose-Capillary: 127 mg/dL — ABNORMAL HIGH (ref 70–99)
Glucose-Capillary: 148 mg/dL — ABNORMAL HIGH (ref 70–99)

## 2020-02-15 LAB — FERRITIN: Ferritin: 513 ng/mL — ABNORMAL HIGH (ref 24–336)

## 2020-02-15 MED ORDER — DARBEPOETIN ALFA 60 MCG/0.3ML IJ SOSY
60.0000 ug | PREFILLED_SYRINGE | Freq: Once | INTRAMUSCULAR | Status: AC
  Administered 2020-02-15: 60 ug via SUBCUTANEOUS
  Filled 2020-02-15: qty 0.3

## 2020-02-15 MED ORDER — HYDRALAZINE HCL 25 MG PO TABS
25.0000 mg | ORAL_TABLET | Freq: Three times a day (TID) | ORAL | Status: DC
  Administered 2020-02-15 – 2020-02-16 (×4): 25 mg via ORAL
  Filled 2020-02-15 (×4): qty 1

## 2020-02-15 MED ORDER — POTASSIUM CHLORIDE CRYS ER 20 MEQ PO TBCR
20.0000 meq | EXTENDED_RELEASE_TABLET | Freq: Once | ORAL | Status: AC
  Administered 2020-02-15: 20 meq via ORAL
  Filled 2020-02-15: qty 1

## 2020-02-15 MED ORDER — SODIUM BICARBONATE 650 MG PO TABS
1300.0000 mg | ORAL_TABLET | Freq: Two times a day (BID) | ORAL | Status: DC
  Administered 2020-02-15 – 2020-02-16 (×3): 1300 mg via ORAL
  Filled 2020-02-15 (×3): qty 2

## 2020-02-15 MED ORDER — MAGNESIUM SULFATE 4 GM/100ML IV SOLN
4.0000 g | Freq: Once | INTRAVENOUS | Status: AC
  Administered 2020-02-15: 4 g via INTRAVENOUS
  Filled 2020-02-15: qty 100

## 2020-02-15 NOTE — TOC Initial Note (Signed)
Transition of Care [facility]) - Initial/Assessment Note    Patient Details  Name: Richard [name] MRN: [id number] Date of Birth: 1955/11/29  [facility] CM/SW Contact:    Alexander Mt, LCSW Phone Number: 02/15/2020, 4:07 PM  Clinical Narrative:                 CSW spoke with pt daughter [name] via telephone at [phone]. Introduced self, role, reason for call. She confirms pt from home, lives with roommate and girlfriend. We discussed recommendations but she feels SNF may be safest for STR. CSW also met with pt at bedside. Introduced self, role, reason for visit. Pt confirmed home address and PCP. He has a cane at home but states he doesn't really use it. He does wish he had more function back from his previous stroke. He shares a story about a friend who had a stroke and is walking around well.  Pt denies memory from yesterday and is alert and oriented with this Probation officer. He would like to see how he does with therapy but is interested in SNF vs HH. He is hopeful to be able to get stronger and move back down towards extended family in [place].   TOC team will f/u with pt tomorrow.   Expected Discharge Plan: [place] Barriers to Discharge: Continued Medical Work up, Ship broker   Patient Goals and CMS Choice Patient states their goals for this hospitalization and ongoing recovery are:: get back walking and potentially move near family in [place] [employer] Compare Post Acute Care list provided to:: Patient Choice offered to / list presented to : Patient, Adult Children  Expected Discharge Plan and Services Expected Discharge Plan: [place] In-house Referral: Clinical Social Work Discharge Planning Services: CM Consult Post Acute Care Choice: [place] arrangements for the past 2 months: [place] Expected Discharge Date: 02/15/20                                     Prior Living Arrangements/Services Living arrangements for the past 2 months: Single Family Home Lives with:: Roommate, Significant Other Patient language and need for interpreter reviewed:: Yes(no needs) Do you feel safe going back to the place where you live?: Yes      Need for Family Participation in Patient Care: Yes (Comment) Care giver support system in place?: Yes (comment) Current home services: DME(cane) Criminal Activity/Legal Involvement Pertinent to Current Situation/Hospitalization: No - Comment as needed  Activities of Daily Living      Permission Sought/Granted Permission sought to share information with : Family Supports Permission granted to share information with : Yes, Verbal Permission Granted  Share Information with NAME: [name]  Permission granted to share info w AGENCY: SNFs  Permission granted to share info w Relationship: daughter  Permission granted to share info w Contact Information: [phone]  Emotional Assessment   Attitude/Demeanor/Rapport: Engaged, Charismatic Affect (typically observed): Accepting, Adaptable, Appropriate Orientation: : Oriented to  Time, Oriented to Place, Oriented to Self, Oriented to Situation Alcohol / Substance Use: Not Applicable Psych Involvement: (n/a)  Admission diagnosis:  Altered mental status, unspecified altered mental status type [R41.82] AMS (altered mental status) [R41.82] Patient Active Problem List   Diagnosis Date Noted  . CKD (chronic kidney disease) stage 5, GFR less than 15 ml/min (HCC) 02/15/2020  . AMS (altered mental status) 02/13/2020  . Neuropathy in  diabetes ([place])   . Migraines   . Hypertension   . GERD (gastroesophageal reflux disease)   . Diabetes mellitus, type II, insulin dependent ([place])   . Arthritis   . Stroke ([place]) 08/26/2012  . Hyperlipidemia 08/25/2012  . Chronic kidney disease, stage 4 (severe) ([place]) 08/23/2012  . Tobacco abuse 08/23/2012  . Protein-calorie  malnutrition, mild ([place]) 08/23/2012  . Type 2 diabetes mellitus ([place]) 08/23/2012  . 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 [place] [address] Phone: [phone] Fax: [phone]     Social Determinants of Health ([place]) Interventions    Readmission Risk Interventions Readmission Risk Prevention Plan 02/15/2020  Transportation Screening Complete  PCP or Specialist Appt within 5-7 Days Not Complete  Not Complete comments pending disposition  Home Care Screening Complete  Medication Review (RN CM) Referral to Pharmacy  Some recent data might be hidden

## 2020-02-15 NOTE — Plan of Care (Signed)
°  Problem: Education: °Goal: Knowledge of General Education information will improve °Description: Including pain rating scale, medication(s)/side effects and non-pharmacologic comfort measures °Outcome: Progressing °  °Problem: Health Behavior/Discharge Planning: °Goal: Ability to manage health-related needs will improve °Outcome: Progressing °  °Problem: Clinical Measurements: °Goal: Ability to maintain clinical measurements within normal limits will improve °Outcome: Progressing °Goal: Respiratory complications will improve °Outcome: Progressing °Goal: Cardiovascular complication will be avoided °Outcome: Progressing °  °Problem: Activity: °Goal: Risk for activity intolerance will decrease °Outcome: Progressing °  °Problem: Nutrition: °Goal: Adequate nutrition will be maintained °Outcome: Progressing °  °Problem: Elimination: °Goal: Will not experience complications related to bowel motility °Outcome: Progressing °Goal: Will not experience complications related to urinary retention °Outcome: Progressing °  °Problem: Pain Managment: °Goal: General experience of comfort will improve °Outcome: Progressing °  °Problem: Safety: °Goal: Ability to remain free from injury will improve °Outcome: Progressing °  °Problem: Skin Integrity: °Goal: Risk for impaired skin integrity will decrease °Outcome: Progressing °  °

## 2020-02-15 NOTE — Social Work (Signed)
Due to pt fluctuating mental status CSW has reached out to pt daughter [name] at [phone]. [name] requests this Probation officer call her back at about 11:30am as she is at work.   Westley Hummer, MSW, [place] Work

## 2020-02-15 NOTE — Progress Notes (Signed)
  Speech Language Pathology Treatment: Dysphagia  Patient Details Name: Richard [name] MRN: [id number] DOB: 06/12/1956 Today's Date: 02/15/2020 Time: 1425-1440 SLP Time Calculation (min) (ACUTE ONLY): 15 min  Assessment / Plan / Recommendation Clinical Impression  SLP provided skilled observation during lunch meal, which pt consumed without his partial dentures per his preference. At times he took large bites, suspect in part related to his feeding abilities/ability to cut smaller pieces. Still, he took his time and cleared his mouth well despite mild-moderately prolonged mastication. This would likely be improved if he put his partial in for future meals. Given his subjective c/o reflux and harder foods (I.e., tough pieces of meat) do not "go down" if he cannot chew them well, recommend wearing dentures for all meals. Pt's mentation is also clearing today - oriented x4 but not able to recall any events from previous day. SLP will continue to follow for cognition only.    HPI HPI: Pt is a 64 yo male presenting with AMS. MRI negative for acute findings. PMH includes: CVA with residual R hemiparesis/contracture, DM, GERD, HTN, migraines, neuropathy. SLP speech/language evaluation in 2013 [facility].       SLP Plan  Continue with current plan of care       Recommendations  Diet recommendations: Regular;Thin liquid Liquids provided via: Cup;Straw Medication Administration: Whole meds with liquid Supervision: Patient able to self feed;Intermittent supervision to cue for compensatory strategies Compensations: Minimize environmental distractions;Slow rate;Small sips/bites Postural Changes and/or Swallow Maneuvers: Seated upright 90 degrees;Upright 30-60 min after meal                Oral Care Recommendations: Oral care BID Follow up Recommendations: 24 hour supervision/assistance;Skilled Nursing facility SLP Visit Diagnosis: Cognitive communication deficit (Q33.354) Plan: Continue with  current plan of care       GO                 Osie Bond., M.A. [place] Acute Rehabilitation Services Pager [phone] Office [phone]  02/15/2020, 2:42 PM

## 2020-02-15 NOTE — Progress Notes (Signed)
PROGRESS NOTE    Richard [name]  TMH:[id number] DOB: 10/16/1955 DOA: 02/13/2020 PCP: [name], MD   Brief Narrative:  64 y.o. male with medical history significant of diabetes, osteoarthritis, hypertension, CKD 5, neuropathies, previous CVA with residual right-sided weakness, migraine headaches who was brought in as a code stroke with altered mental status and left-sided weakness.  Patient is not able to give any history at this point.  He was seen and evaluated by neurology and the code stroke team.    CT head and MRI brain was negative for any acute pathology.  His mentation returned to baseline the next morning.  Seen by PT/OT who recommended SNF.  TSH, B12, ammonia levels were within normal limits.   Assessment & Plan:   Principal Problem:   AMS (altered mental status) Active Problems:   Chronic kidney disease, stage 4 (severe) (HCC)   Tobacco abuse   Type 2 diabetes mellitus (HCC)   Hyperlipidemia   Hypertension   GERD (gastroesophageal reflux disease)   CKD (chronic kidney disease) stage 5, GFR less than 15 ml/min (HCC)   Acute metabolic encephalopathy, unclear etiology-overall better CT head, MRI brain negative.  TSH, ammonia, B12 within normal limits.  PT/OT is recommending SNF.  Unclear etiology.  Mentation back to baseline at the moment. Suspicion for seizures, EEG-negative Keppra 500 mg twice daily Seizure precautions. Neuro following.     CKD stage V Creatinine appears to be in range of 10-11.  Making urine, electrolytes stable for now.  Nephrology following.  Patient has been offered HD catheter placement anticipation for needing dialysis at some point soon but he refuses at this time.  HLD -Currently awaiting pharmacy to complete med rec  Essential HTN -IV hydralazine as needed.  Pharmacy to complete med rec.  DM2 -Accu-Cheks and sliding scale  Tobacco use -Counseled to quit using this.   DVT prophylaxis: Lovenox Code Status: Full  code Family Communication:  None  Status is: Inpatient  Remains inpatient appropriate because:Ongoing diagnostic testing needed not appropriate for outpatient work up   Dispo: The patient is from: Home              Anticipated d/c is to: SNF              Anticipated d/c date is: 1 day              Patient currently  Pending placement to SNF     Subjective: Feels ok, no complaints.  Had an extensive discussion regarding need for dialysis at some point soon.  Patient states his family has had bad experience with dialysis and is currently not interested.  He also also not interested in getting any sort of dialysis catheter at the moment.  Review of Systems Otherwise negative except as per HPI, including: General = no fevers, chills, dizziness,  fatigue HEENT/EYES = negative for loss of vision, double vision, blurred vision,  sore throa Cardiovascular= negative for chest pain, palpitation Respiratory/lungs= negative for shortness of breath, cough, wheezing; hemoptysis,  Gastrointestinal= negative for nausea, vomiting, abdominal pain Genitourinary= negative for Dysuria MSK = Negative for arthralgia, myalgias Neurology= Negative for headache, numbness, tingling  Psychiatry= Negative for suicidal and homocidal ideation Skin= Negative for Rash   Examination:  Constitutional: Not in acute distress Respiratory: Clear to auscultation bilaterally Cardiovascular: Normal sinus rhythm, no rubs Abdomen: Nontender nondistended good bowel sounds Musculoskeletal: No edema noted Skin: No rashes seen Neurologic: CN 2-12 grossly intact.  And nonfocal Psychiatric: Normal judgment  and insight. Alert and oriented x 3. Normal mood.    Objective: Vitals:   02/14/20 0550 02/14/20 1434 02/14/20 2027 02/15/20 0416  BP: (!) 172/88 (!) 180/80 135/65 (!) 173/95  Pulse: 69 76 73 66  Resp:  17 20 14   Temp:  98.3 F (36.8 C) 98.7 F (37.1 C) 98.1 F (36.7 C)  TempSrc:  Oral Oral Oral  SpO2:  100%  100% 100%  Weight:        Intake/Output Summary (Last 24 hours) at 02/15/2020 1358 Last data filed at 02/15/2020 1036 Gross per 24 hour  Intake 2178.65 ml  Output 725 ml  Net 1453.65 ml   Filed Weights   02/13/20 2351  Weight: 68.4 kg     Data Reviewed:   CBC: Recent Labs  Lab 02/13/20 1721 02/13/20 1737 02/14/20 0934 02/15/20 0151  WBC 5.4  --  4.7 4.2  NEUTROABS 4.3  --   --   --   HGB 10.5* 10.9* 9.3* 8.5*  HCT 32.4* 32.0* 27.9* 25.8*  MCV 93.4  --  91.5 90.8  PLT 141*  --  175 161*   Basic Metabolic Panel: Recent Labs  Lab 02/13/20 1721 02/13/20 1737 02/14/20 0934 02/15/20 0151  NA 141 140 141 139  K 3.9 3.8 3.3* 3.7  CL 111 112* 113* 111  CO2 13*  --  17* 16*  GLUCOSE 113* 108* 108* 119*  BUN 64* 55* 64* 64*  CREATININE 10.90* 11.40* 10.98* 10.21*  CALCIUM 8.4*  --  8.2* 7.7*  MG  --   --   --  1.4*  PHOS  --   --   --  5.1*   GFR: Estimated Creatinine Clearance: 7.3 mL/min (A) (by C-G formula based on SCr of 10.21 mg/dL (H)). Liver Function Tests: Recent Labs  Lab 02/13/20 1721 02/14/20 0934 02/15/20 0151  AST 12* 20  --   ALT 10 11  --   ALKPHOS 89 83  --   BILITOT 0.7 0.6  --   PROT 6.3* 5.8*  --   ALBUMIN 3.0* 2.7* 2.4*   No results for input(s): LIPASE, AMYLASE in the last 168 hours. Recent Labs  Lab 02/14/20 0931  AMMONIA 27   Coagulation Profile: Recent Labs  Lab 02/13/20 1721  INR 1.0   Cardiac Enzymes: No results for input(s): CKTOTAL, CKMB, CKMBINDEX, TROPONINI in the last 168 hours. BNP (last 3 results) Recent Labs    06/22/19 1505  PROBNP 170.0*   HbA1C: Recent Labs    02/14/20 0934  HGBA1C 5.2   CBG: Recent Labs  Lab 02/14/20 1216 02/14/20 1720 02/14/20 2052 02/15/20 0749 02/15/20 1151  GLUCAP 111* 118* 130* 93 97   Lipid Profile: Recent Labs    02/14/20 0934  CHOL 147  HDL 31*  LDLCALC 91  TRIG 125  CHOLHDL 4.7   Thyroid Function Tests: Recent Labs    02/14/20 0931  TSH 0.875    Anemia Panel: Recent Labs    02/14/20 0931 02/15/20 0151  VITAMINB12 232  --   FERRITIN  --  513*  TIBC  --  181*  IRON  --  40*   Sepsis Labs: No results for input(s): PROCALCITON, LATICACIDVEN in the last 168 hours.  Recent Results (from the past 240 hour(s))  SARS Coronavirus 2 by RT PCR (hospital order, performed in [facility] hospital lab) Nasopharyngeal Nasopharyngeal Swab     Status: None   Collection Time: 02/13/20  5:21 PM   Specimen: Nasopharyngeal Swab  Result Value Ref Range Status   SARS Coronavirus 2 NEGATIVE NEGATIVE Final    Comment: (NOTE) SARS-CoV-2 target nucleic acids are NOT DETECTED. The SARS-CoV-2 RNA is generally detectable in upper and lower respiratory specimens during the acute phase of infection. The lowest concentration of SARS-CoV-2 viral copies this assay can detect is 250 copies / mL. A negative result does not preclude SARS-CoV-2 infection and should not be used as the sole basis for treatment or other patient management decisions.  A negative result may occur with improper specimen collection / handling, submission of specimen other than nasopharyngeal swab, presence of viral mutation(s) within the areas targeted by this assay, and inadequate number of viral copies (<250 copies / mL). A negative result must be combined with clinical observations, patient history, and epidemiological information. Fact Sheet for Patients:   StrictlyIdeas.no Fact Sheet for Healthcare Providers: BankingDealers.co.za This test is not yet approved or cleared  by the [place] FDA and has been authorized for detection and/or diagnosis of SARS-CoV-2 by FDA under an Emergency Use Authorization (EUA).  This EUA will remain in effect (meaning this test can be used) for the duration of the COVID-19 declaration under Section 564(b)(1) of the Act, 21 U.S.C. section 360bbb-3(b)(1), unless the authorization is terminated  or revoked sooner. Performed at [facility], [place] [address] [place], [place] [address]          Radiology Studies: EEG  Result Date: 02/15/2020 Lora Havens, MD     02/15/2020  8:23 AM Patient Name: Richard [name] MRN: [id number] Epilepsy Attending: Lora Havens Referring Physician/Provider: Etta Quill, PA Date: 02/14/2020 Duration: 24.25 minutes Patient history: 64 year old male presented with sudden onset unresponsiveness however had started to become more responsive on arrival.  MRI brain negative for acute stroke.  EEG to assess for seizures. Level of alertness: Awake, asleep AEDs during EEG study: Keppra Technical aspects: This EEG study was done with scalp electrodes positioned according to the 10-20 International system of electrode placement. Electrical activity was acquired at a sampling rate of 500Hz  and reviewed with a high frequency filter of 70Hz  and a low frequency filter of 1Hz . EEG data were recorded continuously and digitally stored. Description: The posterior dominant rhythm consists of 9 Hz activity of moderate voltage (25-35 uV) seen predominantly in posterior head regions, symmetric and reactive to eye opening and eye closing. Sleep was characterized by vertex waves, sleep spindles (12 to 14 Hz), maximal frontocentral region. Hyperventilation and photic stimulation were not performed.   IMPRESSION: This study is within normal limits. No seizures or epileptiform discharges were seen throughout the recording. Lora Havens   [name]  Result Date: 02/13/2020 CLINICAL DATA:  Encephalopathy EXAM: MRI HEAD WITHOUT CONTRAST TECHNIQUE: Multiplanar, multiecho pulse sequences of the brain and surrounding structures were obtained without intravenous contrast. COMPARISON:  08/24/2012 FINDINGS: Brain: No acute infarct, acute hemorrhage or extra-axial collection. Multifocal white matter hyperintensity, most commonly due to chronic ischemic  microangiopathy. Old left basal ganglia small vessel infarct. Left frontal pole arachnoid cyst. Mild generalized atrophy. Single focus of left cerebellar chronic microhemorrhage. Normal midline structures. Vascular: Normal flow voids. Skull and upper cervical spine: Normal marrow signal. Sinuses/Orbits: Negative. Other: None. IMPRESSION: 1. No acute intracranial abnormality. 2. Old left basal ganglia small vessel infarct. 3. Mild generalized atrophy and chronic ischemic microangiopathy. Electronically Signed   By: Ulyses Jarred M.D.   On: 02/13/2020 21:12   DG Chest Port 1 View  Result Date:  02/13/2020 CLINICAL DATA:  Altered mental status EXAM: PORTABLE CHEST 1 VIEW COMPARISON:  06/22/2019 FINDINGS: Borderline to mild cardiomegaly. No edema, pleural effusion, pneumothorax or focal airspace disease IMPRESSION: No active disease.  Borderline to mild cardiomegaly Electronically Signed   By: Donavan Foil M.D.   On: 02/13/2020 20:10   ECHOCARDIOGRAM COMPLETE  Result Date: 02/14/2020    ECHOCARDIOGRAM REPORT   Patient Name:   Richard [name] Date of Exam: 02/14/2020 Medical Rec #:  [id number]             Height:       72.0 in Accession #:    [id number]            Weight:       150.8 lb Date of Birth:  11/18/1955             BSA:          1.890 m Patient Age:    27 years              BP:           172/88 mmHg Patient Gender: M                     HR:           69 bpm. Exam Location:  Inpatient Procedure: 2D Echo, Cardiac Doppler and Color Doppler Indications:    TIA  History:        Patient has prior history of Echocardiogram examinations, most                 recent 05/23/2012. Risk Factors:Dyslipidemia, Diabetes, Current                 Smoker and Hypertension. CKD. GERD.  Sonographer:    Clayton Lefort RDCS (AE) Referring Phys: [place]  1. Left ventricular ejection fraction, by estimation, is 55%. The left ventricle has normal function. The left ventricle has no regional wall motion  abnormalities. There is moderate left ventricular hypertrophy. Left ventricular diastolic parameters are  consistent with Grade I diastolic dysfunction (impaired relaxation).  2. Right ventricular systolic function is normal. The right ventricular size is normal. Tricuspid regurgitation signal is inadequate for assessing PA pressure.  3. Left atrial size was mildly dilated.  4. The mitral valve is normal in structure. No evidence of mitral valve regurgitation. No evidence of mitral stenosis.  5. The aortic valve is tricuspid. Aortic valve regurgitation is not visualized. No aortic stenosis is present.  6. The inferior vena cava is normal in size with greater than 50% respiratory variability, suggesting right atrial pressure of 3 mmHg. FINDINGS  Left Ventricle: Left ventricular ejection fraction, by estimation, is 55%. The left ventricle has normal function. The left ventricle has no regional wall motion abnormalities. The left ventricular internal cavity size was normal in size. There is moderate left ventricular hypertrophy. Left ventricular diastolic parameters are consistent with Grade I diastolic dysfunction (impaired relaxation). Right Ventricle: The right ventricular size is normal. No increase in right ventricular wall thickness. Right ventricular systolic function is normal. Tricuspid regurgitation signal is inadequate for assessing PA pressure. Left Atrium: Left atrial size was mildly dilated. Right Atrium: Right atrial size was normal in size. Pericardium: Trivial pericardial effusion is present. Mitral Valve: The mitral valve is normal in structure. No evidence of mitral valve regurgitation. No evidence of mitral valve stenosis. MV peak gradient, 5.4 mmHg. The mean mitral valve gradient is  2.0 mmHg. Tricuspid Valve: The tricuspid valve is normal in structure. Tricuspid valve regurgitation is not demonstrated. Aortic Valve: The aortic valve is tricuspid. Aortic valve regurgitation is not visualized. No  aortic stenosis is present. Aortic valve mean gradient measures 2.0 mmHg. Aortic valve peak gradient measures 4.7 mmHg. Aortic valve area, by VTI measures 3.70 cm. Pulmonic Valve: The pulmonic valve was normal in structure. Pulmonic valve regurgitation is not visualized. No evidence of pulmonic stenosis. Aorta: The aortic root is normal in size and structure. Venous: The inferior vena cava is normal in size with greater than 50% respiratory variability, suggesting right atrial pressure of 3 mmHg. IAS/Shunts: No atrial level shunt detected by color flow Doppler.  LEFT VENTRICLE PLAX 2D LVIDd:         4.61 cm      Diastology LVIDs:         3.53 cm      LV e' lateral:   3.23 cm/s LV PW:         1.81 cm      LV E/e' lateral: 20.5 LV IVS:        1.34 cm      LV e' medial:    2.89 cm/s LVOT diam:     2.20 cm      LV E/e' medial:  22.9 LV SV:         67 LV SV Index:   36 LVOT Area:     3.80 cm                              3D Volume EF: LV Volumes (MOD)            3D EF:        43 % LV vol d, MOD A2C: 128.0 ml LV EDV:       275 ml LV vol d, MOD A4C: 109.0 ml LV ESV:       157 ml LV vol s, MOD A2C: 80.6 ml  LV SV:        117 ml LV vol s, MOD A4C: 71.2 ml LV SV MOD A2C:     47.4 ml LV SV MOD A4C:     109.0 ml LV SV MOD BP:      36.3 ml RIGHT VENTRICLE             IVC RV Basal diam:  2.49 cm     IVC diam: 1.81 cm RV S prime:     13.10 cm/s TAPSE (M-mode): 2.8 cm LEFT ATRIUM             Index       RIGHT ATRIUM           Index LA diam:        3.40 cm 1.80 cm/m  RA Area:     17.10 cm LA Vol (A2C):   95.8 ml 50.70 ml/m RA Volume:   42.70 ml  22.60 ml/m LA Vol (A4C):   40.4 ml 21.38 ml/m LA Biplane Vol: 68.3 ml 36.14 ml/m  AORTIC VALVE AV Area (Vmax):    3.25 cm AV Area (Vmean):   2.92 cm AV Area (VTI):     3.70 cm AV Vmax:           108.00 cm/s AV Vmean:          74.100 cm/s AV VTI:  0.182 m AV Peak Grad:      4.7 mmHg AV Mean Grad:      2.0 mmHg LVOT Vmax:         92.40 cm/s LVOT Vmean:        56.900 cm/s  LVOT VTI:          0.177 m LVOT/AV VTI ratio: 0.97  AORTA Ao Root diam: 3.40 cm Ao Asc diam:  3.00 cm MITRAL VALVE MV Area (PHT): 2.48 cm     SHUNTS MV Peak grad:  5.4 mmHg     Systemic VTI:  0.18 m MV Mean grad:  2.0 mmHg     Systemic Diam: 2.20 cm MV Vmax:       1.16 m/s MV Vmean:      57.6 cm/s MV Decel Time: 306 msec MV E velocity: 66.10 cm/s MV A velocity: 117.00 cm/s MV E/A ratio:  0.56 Loralie Champagne MD Electronically signed by Loralie Champagne MD Signature Date/Time: 02/14/2020/4:49:10 PM    Final    CT HEAD CODE STROKE WO CONTRAST  Result Date: 02/13/2020 CLINICAL DATA:  Code stroke.  Unresponsive. EXAM: CT HEAD WITHOUT CONTRAST TECHNIQUE: Contiguous axial images were obtained from the base of the skull through the vertex without intravenous contrast. COMPARISON:  01/23/2018 FINDINGS: The study is mildly motion degraded. Brain: No acute cortically based infarct, intracranial hemorrhage, mass, midline shift, or significant extra-axial fluid collection is identified. Patchy hypodensities in the cerebral white matter bilaterally have progressed from the prior study and are nonspecific but compatible with moderate chronic small vessel ischemic disease. A chronic infarct is again noted in the left corona radiata and external capsule. The ventricles are within normal limits for age. Focally enlarged extra-axial CSF space anteriorly over the left frontal lobe is unchanged. Vascular: Calcified atherosclerosis at the skull base. No hyperdense vessel. Skull: No fracture or suspicious osseous lesion. Sinuses/Orbits: Old medial left orbital fracture. The visualized paranasal sinuses and mastoid air cells are clear. Other: None. ASPECTS [facility] Stroke Program Early CT Score) Not scored given lack of provided localizing symptoms. IMPRESSION: 1. No evidence of acute intracranial abnormality. 2. Moderate chronic small vessel ischemic disease, progressed from 2019. These results were communicated to Dr. Lorraine Lax at 5:47 pm on  02/13/2020 by text page via the [facility] messaging system. Electronically Signed   By: Logan Bores M.D.   On: 02/13/2020 17:48   VAS US CAROTID (at [facility] and [facility] only)  Result Date: 02/15/2020 Carotid Arterial Duplex Study Indications:       TIA. Risk Factors:      Hypertension, Diabetes. Comparison Study:  05/22/12 Performing Technologist: June Leap RDMS, RVT  Examination Guidelines: A complete evaluation includes B-mode imaging, spectral Doppler, color Doppler, and power Doppler as needed of all accessible portions of each vessel. Bilateral testing is considered an integral part of a complete examination. Limited examinations for reoccurring indications may be performed as noted.  Right Carotid Findings: +----------+--------+--------+--------+------------------+--------+           PSV cm/sEDV cm/sStenosisPlaque DescriptionComments +----------+--------+--------+--------+------------------+--------+ CCA Prox  85      11                                         +----------+--------+--------+--------+------------------+--------+ CCA Distal58      9                                          +----------+--------+--------+--------+------------------+--------+  ICA Prox  66      17              heterogenous               +----------+--------+--------+--------+------------------+--------+ ICA Distal58      15                                         +----------+--------+--------+--------+------------------+--------+ ECA       80      7               heterogenous               +----------+--------+--------+--------+------------------+--------+ +----------+--------+-------+----------------+-------------------+           PSV cm/sEDV cmsDescribe        Arm Pressure (mmHG) +----------+--------+-------+----------------+-------------------+ [id number]            Multiphasic, WNL                    +----------+--------+-------+----------------+-------------------+  +---------+--------+--+--------+-+---------+ VertebralPSV cm/s43EDV cm/s9Antegrade +---------+--------+--+--------+-+---------+  Left Carotid Findings: +----------+--------+--------+--------+------------------+--------+           PSV cm/sEDV cm/sStenosisPlaque DescriptionComments +----------+--------+--------+--------+------------------+--------+ CCA Prox  80      14                                         +----------+--------+--------+--------+------------------+--------+ CCA Distal83      16                                         +----------+--------+--------+--------+------------------+--------+ ICA Prox  69      16      1-39%   heterogenous               +----------+--------+--------+--------+------------------+--------+ ICA Distal70      19                                         +----------+--------+--------+--------+------------------+--------+ ECA       158     14                                         +----------+--------+--------+--------+------------------+--------+ +----------+--------+--------+----------------+-------------------+           PSV cm/sEDV cm/sDescribe        Arm Pressure (mmHG) +----------+--------+--------+----------------+-------------------+ [id number]              Multiphasic, WNL                    +----------+--------+--------+----------------+-------------------+ +---------+--------+--+--------+--+---------+ VertebralPSV cm/s42EDV cm/s11Antegrade +---------+--------+--+--------+--+---------+   Summary: Right Carotid: Velocities in the right ICA are consistent with a 1-39% stenosis. Left Carotid: Velocities in the left ICA are consistent with a 1-39% stenosis.  *See table(s) above for measurements and observations.     Preliminary         Scheduled Meds: .  stroke: mapping our early stages of recovery book   Does not apply Once  . amLODipine  10 mg Oral Daily  . calcitRIOL  0.25 mcg Oral Daily  .  cloNIDine  0.2 mg Oral BID  . enoxaparin (LOVENOX) injection  30 mg Subcutaneous Q24H  . hydrALAZINE  25 mg Oral Q8H  . insulin aspart  0-5 Units Subcutaneous QHS  . insulin aspart  0-9 Units Subcutaneous TID WC  . sodium bicarbonate  1,300 mg Oral BID  . sodium chloride flush  3 mL Intravenous Once   Continuous Infusions: . levETIRAcetam 500 mg (02/15/20 0511)     LOS: 2 days   Time spent= 35 mins    Ryin Ambrosius Arsenio Loader, MD [facility]  If 7PM-7AM, please contact night-coverage  02/15/2020, 1:58 PM

## 2020-02-15 NOTE — Procedures (Signed)
Patient Name: Richard [name]  MRN: [id number]  Epilepsy Attending: Lora Havens  Referring Physician/Provider: Etta Quill, PA Date: 02/14/2020 Duration: 24.25 minutes  Patient history: 64 year old male presented with sudden onset unresponsiveness however had started to become more responsive on arrival.  MRI brain negative for acute stroke.  EEG to assess for seizures.  Level of alertness: Awake, asleep  AEDs during EEG study: Keppra  Technical aspects: This EEG study was done with scalp electrodes positioned according to the 10-20 International system of electrode placement. Electrical activity was acquired at a sampling rate of 500Hz  and reviewed with a high frequency filter of 70Hz  and a low frequency filter of 1Hz . EEG data were recorded continuously and digitally stored.   Description: The posterior dominant rhythm consists of 9 Hz activity of moderate voltage (25-35 uV) seen predominantly in posterior head regions, symmetric and reactive to eye opening and eye closing. Sleep was characterized by vertex waves, sleep spindles (12 to 14 Hz), maximal frontocentral region. Hyperventilation and photic stimulation were not performed.     IMPRESSION: This study is within normal limits. No seizures or epileptiform discharges were seen throughout the recording.  Richard [name]

## 2020-02-15 NOTE — Progress Notes (Addendum)
[facility] Progress Note   Subjective:   Feeling fine this morning.  No new c/os.  No dysgeusia, hiccups, edema, orthopnea, CP.   Objective Vitals:   02/14/20 0550 02/14/20 1434 02/14/20 2027 02/15/20 0416  BP: (!) 172/88 (!) 180/80 135/65 (!) 173/95  Pulse: 69 76 73 66  Resp:  17 20 14   Temp:  98.3 F (36.8 C) 98.7 F (37.1 C) 98.1 F (36.7 C)  TempSrc:  Oral Oral Oral  SpO2:  100% 100% 100%  Weight:       Physical Exam General: lying flat in bed comfortably Heart: no rub Lungs: no rales Abdomen: soft Extremities: no edema Neuro: no asterixis.  AOx3 but still hazy on details of presentation.  Additional Objective Labs: Basic Metabolic Panel: Recent Labs  Lab 02/13/20 1721 02/13/20 1721 02/13/20 1737 02/14/20 0934 02/15/20 0151  NA 141   < > 140 141 139  K 3.9   < > 3.8 3.3* 3.7  CL 111   < > 112* 113* 111  CO2 13*  --   --  17* 16*  GLUCOSE 113*   < > 108* 108* 119*  BUN 64*   < > 55* 64* 64*  CREATININE 10.90*   < > 11.40* 10.98* 10.21*  CALCIUM 8.4*  --   --  8.2* 7.7*  PHOS  --   --   --   --  5.1*   < > = values in this interval not displayed.   Liver Function Tests: Recent Labs  Lab 02/13/20 1721 02/14/20 0934 02/15/20 0151  AST 12* 20  --   ALT 10 11  --   ALKPHOS 89 83  --   BILITOT 0.7 0.6  --   PROT 6.3* 5.8*  --   ALBUMIN 3.0* 2.7* 2.4*   No results for input(s): LIPASE, AMYLASE in the last 168 hours. CBC: Recent Labs  Lab 02/13/20 1721 02/13/20 1721 02/13/20 1737 02/14/20 0934 02/15/20 0151  WBC 5.4  --   --  4.7 4.2  NEUTROABS 4.3  --   --   --   --   HGB 10.5*   < > 10.9* 9.3* 8.5*  HCT 32.4*   < > 32.0* 27.9* 25.8*  MCV 93.4  --   --  91.5 90.8  PLT 141*  --   --  175 145*   < > = values in this interval not displayed.   Blood Culture    Component Value Date/Time   SDES BLOOD LEFT HAND 11/30/2014 0145   SPECREQUEST BOTTLES DRAWN AEROBIC ONLY 5CC 11/30/2014 0145   CULT  11/30/2014 0145    NO GROWTH 5  DAYS Performed at [facility] 12/06/2014 FINAL 11/30/2014 0145    Cardiac Enzymes: No results for input(s): CKTOTAL, CKMB, CKMBINDEX, TROPONINI in the last 168 hours. CBG: Recent Labs  Lab 02/14/20 0927 02/14/20 1216 02/14/20 1720 02/14/20 2052 02/15/20 0749  GLUCAP 114* 111* 118* 130* 93   Iron Studies:  Recent Labs    02/15/20 0151  IRON 40*  TIBC 181*  FERRITIN 513*   @lablastinr3 @ Studies/Results: EEG  Result Date: 02/15/2020 Lora Havens, MD     02/15/2020  8:23 AM Patient Name: Randale Carvalho MRN: [id number] Epilepsy Attending: Lora Havens Referring Physician/Provider: Etta Quill, PA Date: 02/14/2020 Duration: 24.25 minutes Patient history: 64 year old male presented with sudden onset unresponsiveness however had started to become more responsive on arrival.  MRI brain negative for acute  stroke.  EEG to assess for seizures. Level of alertness: Awake, asleep AEDs during EEG study: Keppra Technical aspects: This EEG study was done with scalp electrodes positioned according to the 10-20 International system of electrode placement. Electrical activity was acquired at a sampling rate of 500Hz  and reviewed with a high frequency filter of 70Hz  and a low frequency filter of 1Hz . EEG data were recorded continuously and digitally stored. Description: The posterior dominant rhythm consists of 9 Hz activity of moderate voltage (25-35 uV) seen predominantly in posterior head regions, symmetric and reactive to eye opening and eye closing. Sleep was characterized by vertex waves, sleep spindles (12 to 14 Hz), maximal frontocentral region. Hyperventilation and photic stimulation were not performed.   IMPRESSION: This study is within normal limits. No seizures or epileptiform discharges were seen throughout the recording. Lora Havens   [name]  Result Date: 02/13/2020 CLINICAL DATA:  Encephalopathy EXAM: MRI HEAD WITHOUT CONTRAST  TECHNIQUE: Multiplanar, multiecho pulse sequences of the brain and surrounding structures were obtained without intravenous contrast. COMPARISON:  08/24/2012 FINDINGS: Brain: No acute infarct, acute hemorrhage or extra-axial collection. Multifocal white matter hyperintensity, most commonly due to chronic ischemic microangiopathy. Old left basal ganglia small vessel infarct. Left frontal pole arachnoid cyst. Mild generalized atrophy. Single focus of left cerebellar chronic microhemorrhage. Normal midline structures. Vascular: Normal flow voids. Skull and upper cervical spine: Normal marrow signal. Sinuses/Orbits: Negative. Other: None. IMPRESSION: 1. No acute intracranial abnormality. 2. Old left basal ganglia small vessel infarct. 3. Mild generalized atrophy and chronic ischemic microangiopathy. Electronically Signed   By: Ulyses Jarred M.D.   On: 02/13/2020 21:12   DG Chest Port 1 View  Result Date: 02/13/2020 CLINICAL DATA:  Altered mental status EXAM: PORTABLE CHEST 1 VIEW COMPARISON:  06/22/2019 FINDINGS: Borderline to mild cardiomegaly. No edema, pleural effusion, pneumothorax or focal airspace disease IMPRESSION: No active disease.  Borderline to mild cardiomegaly Electronically Signed   By: Donavan Foil M.D.   On: 02/13/2020 20:10   ECHOCARDIOGRAM COMPLETE  Result Date: 02/14/2020    ECHOCARDIOGRAM REPORT   Patient Name:   QUINTIN HJORT Date of Exam: 02/14/2020 Medical Rec #:  [id number]             Height:       72.0 in Accession #:    [id number]            Weight:       150.8 lb Date of Birth:  1955/12/08             BSA:          1.890 m Patient Age:    64 years              BP:           172/88 mmHg Patient Gender: M                     HR:           69 bpm. Exam Location:  Inpatient Procedure: 2D Echo, Cardiac Doppler and Color Doppler Indications:    TIA  History:        Patient has prior history of Echocardiogram examinations, most                 recent 05/23/2012. Risk  Factors:Dyslipidemia, Diabetes, Current                 Smoker and Hypertension. CKD. GERD.  Sonographer:  Clayton Lefort RDCS (AE) Referring Phys: [place]  1. Left ventricular ejection fraction, by estimation, is 55%. The left ventricle has normal function. The left ventricle has no regional wall motion abnormalities. There is moderate left ventricular hypertrophy. Left ventricular diastolic parameters are  consistent with Grade I diastolic dysfunction (impaired relaxation).  2. Right ventricular systolic function is normal. The right ventricular size is normal. Tricuspid regurgitation signal is inadequate for assessing PA pressure.  3. Left atrial size was mildly dilated.  4. The mitral valve is normal in structure. No evidence of mitral valve regurgitation. No evidence of mitral stenosis.  5. The aortic valve is tricuspid. Aortic valve regurgitation is not visualized. No aortic stenosis is present.  6. The inferior vena cava is normal in size with greater than 50% respiratory variability, suggesting right atrial pressure of 3 mmHg. FINDINGS  Left Ventricle: Left ventricular ejection fraction, by estimation, is 55%. The left ventricle has normal function. The left ventricle has no regional wall motion abnormalities. The left ventricular internal cavity size was normal in size. There is moderate left ventricular hypertrophy. Left ventricular diastolic parameters are consistent with Grade I diastolic dysfunction (impaired relaxation). Right Ventricle: The right ventricular size is normal. No increase in right ventricular wall thickness. Right ventricular systolic function is normal. Tricuspid regurgitation signal is inadequate for assessing PA pressure. Left Atrium: Left atrial size was mildly dilated. Right Atrium: Right atrial size was normal in size. Pericardium: Trivial pericardial effusion is present. Mitral Valve: The mitral valve is normal in structure. No evidence of mitral valve  regurgitation. No evidence of mitral valve stenosis. MV peak gradient, 5.4 mmHg. The mean mitral valve gradient is 2.0 mmHg. Tricuspid Valve: The tricuspid valve is normal in structure. Tricuspid valve regurgitation is not demonstrated. Aortic Valve: The aortic valve is tricuspid. Aortic valve regurgitation is not visualized. No aortic stenosis is present. Aortic valve mean gradient measures 2.0 mmHg. Aortic valve peak gradient measures 4.7 mmHg. Aortic valve area, by VTI measures 3.70 cm. Pulmonic Valve: The pulmonic valve was normal in structure. Pulmonic valve regurgitation is not visualized. No evidence of pulmonic stenosis. Aorta: The aortic root is normal in size and structure. Venous: The inferior vena cava is normal in size with greater than 50% respiratory variability, suggesting right atrial pressure of 3 mmHg. IAS/Shunts: No atrial level shunt detected by color flow Doppler.  LEFT VENTRICLE PLAX 2D LVIDd:         4.61 cm      Diastology LVIDs:         3.53 cm      LV e' lateral:   3.23 cm/s LV PW:         1.81 cm      LV E/e' lateral: 20.5 LV IVS:        1.34 cm      LV e' medial:    2.89 cm/s LVOT diam:     2.20 cm      LV E/e' medial:  22.9 LV SV:         67 LV SV Index:   36 LVOT Area:     3.80 cm                              3D Volume EF: LV Volumes (MOD)            3D EF:        43 % LV  vol d, MOD A2C: 128.0 ml LV EDV:       275 ml LV vol d, MOD A4C: 109.0 ml LV ESV:       157 ml LV vol s, MOD A2C: 80.6 ml  LV SV:        117 ml LV vol s, MOD A4C: 71.2 ml LV SV MOD A2C:     47.4 ml LV SV MOD A4C:     109.0 ml LV SV MOD BP:      36.3 ml RIGHT VENTRICLE             IVC RV Basal diam:  2.49 cm     IVC diam: 1.81 cm RV S prime:     13.10 cm/s TAPSE (M-mode): 2.8 cm LEFT ATRIUM             Index       RIGHT ATRIUM           Index LA diam:        3.40 cm 1.80 cm/m  RA Area:     17.10 cm LA Vol (A2C):   95.8 ml 50.70 ml/m RA Volume:   42.70 ml  22.60 ml/m LA Vol (A4C):   40.4 ml 21.38 ml/m LA Biplane  Vol: 68.3 ml 36.14 ml/m  AORTIC VALVE AV Area (Vmax):    3.25 cm AV Area (Vmean):   2.92 cm AV Area (VTI):     3.70 cm AV Vmax:           108.00 cm/s AV Vmean:          74.100 cm/s AV VTI:            0.182 m AV Peak Grad:      4.7 mmHg AV Mean Grad:      2.0 mmHg LVOT Vmax:         92.40 cm/s LVOT Vmean:        56.900 cm/s LVOT VTI:          0.177 m LVOT/AV VTI ratio: 0.97  AORTA Ao Root diam: 3.40 cm Ao Asc diam:  3.00 cm MITRAL VALVE MV Area (PHT): 2.48 cm     SHUNTS MV Peak grad:  5.4 mmHg     Systemic VTI:  0.18 m MV Mean grad:  2.0 mmHg     Systemic Diam: 2.20 cm MV Vmax:       1.16 m/s MV Vmean:      57.6 cm/s MV Decel Time: 306 msec MV E velocity: 66.10 cm/s MV A velocity: 117.00 cm/s MV E/A ratio:  0.56 Loralie Champagne MD Electronically signed by Loralie Champagne MD Signature Date/Time: 02/14/2020/4:49:10 PM    Final    CT HEAD CODE STROKE WO CONTRAST  Result Date: 02/13/2020 CLINICAL DATA:  Code stroke.  Unresponsive. EXAM: CT HEAD WITHOUT CONTRAST TECHNIQUE: Contiguous axial images were obtained from the base of the skull through the vertex without intravenous contrast. COMPARISON:  01/23/2018 FINDINGS: The study is mildly motion degraded. Brain: No acute cortically based infarct, intracranial hemorrhage, mass, midline shift, or significant extra-axial fluid collection is identified. Patchy hypodensities in the cerebral white matter bilaterally have progressed from the prior study and are nonspecific but compatible with moderate chronic small vessel ischemic disease. A chronic infarct is again noted in the left corona radiata and external capsule. The ventricles are within normal limits for age. Focally enlarged extra-axial CSF space anteriorly over the left frontal lobe is unchanged. Vascular: Calcified atherosclerosis at the skull  base. No hyperdense vessel. Skull: No fracture or suspicious osseous lesion. Sinuses/Orbits: Old medial left orbital fracture. The visualized paranasal sinuses and mastoid  air cells are clear. Other: None. ASPECTS [facility] Stroke Program Early CT Score) Not scored given lack of provided localizing symptoms. IMPRESSION: 1. No evidence of acute intracranial abnormality. 2. Moderate chronic small vessel ischemic disease, progressed from 2019. These results were communicated to Dr. Lorraine Lax at 5:47 pm on 02/13/2020 by text page via the [facility] messaging system. Electronically Signed   By: Logan Bores M.D.   On: 02/13/2020 17:48   Medications: . levETIRAcetam 500 mg (02/15/20 0511)  . magnesium sulfate bolus IVPB     .  stroke: mapping our early stages of recovery book   Does not apply Once  . amLODipine  10 mg Oral Daily  . calcitRIOL  0.25 mcg Oral Daily  . cloNIDine  0.2 mg Oral BID  . enoxaparin (LOVENOX) injection  30 mg Subcutaneous Q24H  . insulin aspart  0-5 Units Subcutaneous QHS  . insulin aspart  0-9 Units Subcutaneous TID WC  . potassium chloride  20 mEq Oral Once  . sodium chloride flush  3 mL Intravenous Once   :  Assessment/Plan **Transient AMS episode: neurology following, think complex partial seizure, on keppra now; EEG unrevealing.     **CKD5: advanced CKD without uremic symptoms or volume overload.  Discussed his extremely low function with him, still not ready to proceed with vascular access.  I explained now is the time to proceed as he is looking at dialysis in the near future based on recent labs.  Encouraged him to seek second opinion of that's what he desires.  Expressed concern that if he doesn't follow through on these issues he's going to have to start dialysis in an emergent setting and it could be life threatening.  He has poor insight.   I will have the office set up f/u for him in 2 weeks if he's willing to come.   **Anemia of CKD:  Hb acceptable in 9s, iron ok. Give aranesp 60 mg today.  If he comes to clinic we can arrange outpt ESA if he agrees (had previously been referred but didn't go).  **Metabolic acidosis:  Secondary to CKD.  PO bicarb 1300 BID.  Will need on d/c.  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 Phos 5.1, needs low phos diet, PTH here pending.    **HTN: resumed home meds, still high. No volume overload.  Start hydralazine 25 TID.   **DM, HL, tobacco abuse noted - per primary  I will sign off - call with issues.   Jannifer Hick MD 02/15/2020, 10:01 AM  [facility] Pager: [phone]

## 2020-02-15 NOTE — Progress Notes (Signed)
Carotid duplex       has been completed. Preliminary results can be found under CV proc through chart review. Lori Popowski, BS, RDMS, RVT   

## 2020-02-16 LAB — CBC
HCT: 30 % — ABNORMAL LOW (ref 39.0–52.0)
Hemoglobin: 10.1 g/dL — ABNORMAL LOW (ref 13.0–17.0)
MCH: 31 pg (ref 26.0–34.0)
MCHC: 33.7 g/dL (ref 30.0–36.0)
MCV: 92 fL (ref 80.0–100.0)
Platelets: 143 10*3/uL — ABNORMAL LOW (ref 150–400)
RBC: 3.26 MIL/uL — ABNORMAL LOW (ref 4.22–5.81)
RDW: 15.4 % (ref 11.5–15.5)
WBC: 4.3 10*3/uL (ref 4.0–10.5)
nRBC: 0 % (ref 0.0–0.2)

## 2020-02-16 LAB — GLUCOSE, CAPILLARY
Glucose-Capillary: 95 mg/dL (ref 70–99)
Glucose-Capillary: 122 mg/dL — ABNORMAL HIGH (ref 70–99)
Glucose-Capillary: 115 mg/dL — ABNORMAL HIGH (ref 70–99)

## 2020-02-16 LAB — PTH, INTACT AND CALCIUM
Calcium, Total (PTH): 7.4 mg/dL — ABNORMAL LOW (ref 8.6–10.2)
PTH: 137 pg/mL — ABNORMAL HIGH (ref 15–65)

## 2020-02-16 LAB — MAGNESIUM: Magnesium: 2.3 mg/dL (ref 1.7–2.4)

## 2020-02-16 MED ORDER — LEVETIRACETAM 500 MG PO TABS: 500.0000 mg | ORAL_TABLET | Freq: Two times a day (BID) | ORAL | 0 refills | Status: DC

## 2020-02-16 MED ORDER — HYDRALAZINE HCL 25 MG PO TABS: 25.0000 mg | ORAL_TABLET | Freq: Three times a day (TID) | ORAL | 0 refills | Status: DC

## 2020-02-16 MED ORDER — SALINE SPRAY 0.65 % NA SOLN
1.0000 | NASAL | Status: DC | PRN
  Filled 2020-02-16: qty 44

## 2020-02-16 NOTE — Progress Notes (Signed)
Physical Therapy Treatment Patient Details Name: Richard [name] MRN: [id number] DOB: 22-Mar-1956 Today's Date: 02/16/2020    History of Present Illness Pt is a 64 yo male presenting with AMS. MRI negative for acute findings. PMH includes: CVA with residual R hemiparesis/contracture, DM, GERD, HTN, migraines, neuropathy    PT Comments    Pt with improved cognition today, as evidenced by ability to now retain and carryover information. Requiring min guard assist for functional mobility. Ambulating limited room distances with a quad cane. Further distance limited by right hip pain. Pt fiance reports she is able to provide necessary assist for ADL's and supervision. Pt asking questions about obtaining a power wheelchair; instructed him that this would be a referral process through his PCP. Pt with no further questions/concerns at this time.    Follow Up Recommendations  Home health PT;Supervision/Assistance - 24 hour     Equipment Recommendations  Other (comment)(quad cane)    Recommendations for Other Services       Precautions / Restrictions Precautions Precautions: Fall Restrictions Weight Bearing Restrictions: No    Mobility  Bed Mobility Overal bed mobility: Needs Assistance Bed Mobility: Supine to Sit Rolling: Modified independent (Device/Increase time)         General bed mobility comments: No physical assist required, increased time/effort, use of bed rail and HOB elevated  Transfers Overall transfer level: Needs assistance Equipment used: Quad cane Transfers: Sit to/from Stand Sit to Stand: Min guard            Ambulation/Gait Ambulation/Gait assistance: Counsellor (Feet): 10 Feet Assistive device: Quad cane Gait Pattern/deviations: Narrow base of support;Decreased weight shift to right;Decreased dorsiflexion - right;Decreased stance time - right;Steppage;Step-through pattern Gait velocity: decreased Gait velocity interpretation: <1.31  ft/sec, indicative of household ambulator General Gait Details: Min guard for safety, slow speed, noted gait deficits including R hip hike, circumduction, knee hyperextension. Distance limited by pain.   Stairs             Wheelchair Mobility    Modified Rankin (Stroke Patients Only)       Balance Overall balance assessment: Needs assistance Sitting-balance support: Feet supported Sitting balance-Leahy Scale: Good     Standing balance support: During functional activity;Single extremity supported Standing balance-Leahy Scale: Fair                              Cognition Arousal/Alertness: Awake/alert Behavior During Therapy: WFL for tasks assessed/performed Overall Cognitive Status: Impaired/Different from baseline Area of Impairment: Memory                     Memory: Decreased short-term memory                Exercises      General Comments        Pertinent Vitals/Pain Pain Assessment: Faces Faces Pain Scale: Hurts even more Pain Location: R hip Pain Descriptors / Indicators: Grimacing;Guarding Pain Intervention(s): Monitored during session    Home Living                      Prior Function            PT Goals (current goals can now be found in the care plan section) Acute Rehab PT Goals Patient Stated Goal: to get a power wheelchair so he can get out in his yard Potential to Achieve Goals: Good Progress towards PT goals: Progressing  toward goals    Frequency    Min 3X/week      PT Plan Discharge plan needs to be updated    Co-evaluation              AM-PAC PT "6 Clicks" Mobility   Outcome Measure  Help needed turning from your back to your side while in a flat bed without using bedrails?: None Help needed moving from lying on your back to sitting on the side of a flat bed without using bedrails?: None Help needed moving to and from a bed to a chair (including a wheelchair)?: None Help needed  standing up from a chair using your arms (e.g., wheelchair or bedside chair)?: A Little Help needed to walk in hospital room?: A Little Help needed climbing 3-5 steps with a railing? : A Little 6 Click Score: 21    End of Session   Activity Tolerance: Patient tolerated treatment well Patient left: in bed;with call bell/phone within reach;with bed alarm set Nurse Communication: Mobility status PT Visit Diagnosis: Unsteadiness on feet (R26.81);Difficulty in walking, not elsewhere classified (R26.2);Hemiplegia and hemiparesis Hemiplegia - Right/Left: Right Hemiplegia - dominant/non-dominant: Dominant Hemiplegia - caused by: Cerebral infarction     Time: 1325-1345 PT Time Calculation (min) (ACUTE ONLY): 20 min  Charges:  $Therapeutic Activity: 8-22 mins                       Wyona Almas, PT, DPT Acute Rehabilitation Services Pager [phone] Office [phone]    Deno Etienne 02/16/2020, 3:37 PM

## 2020-02-16 NOTE — TOC Transition Note (Signed)
Transition of Care [facility]) - CM/SW Discharge Note   Patient Details  Name: Richard [name] MRN: [id number] Date of Birth: 1956/08/03  [facility] CM/SW Contact:  Alexander Mt, LCSW Phone Number: 02/16/2020, 4:22 PM   Clinical Narrative:    Pt stable for d/c per MD, able to d/c home with PT/OT. Referral sent to Joen Laura at [facility] via fax as hub is temporarily down. Quad cane ordered for pt.    Final next level of care: [place] Barriers to Discharge: Barriers Resolved   Patient Goals and CMS Choice Patient states their goals for this hospitalization and ongoing recovery are:: pt now states he wants to go home and rehab himself there. agreeable to [facility] CMS Medicare.gov Compare Post Acute Care list provided to:: Patient Choice offered to / list presented to : Patient  Discharge Placement Patient to be transferred to facility by: personal vehicle Name of family member notified: pt responsible for self, pt girlfriend also present    Discharge Plan and Services In-house Referral: Clinical Social Work Discharge Planning Services: CM Consult Post Acute Care Choice: Museum/gallery conservator, Home Health          [facility] Agency: [facility] Date [place]: 02/16/20 Time [place]: 1622 Representative spoke with at [place]: Joen Laura   Readmission Risk Interventions Readmission Risk Prevention Plan 02/15/2020  Transportation Screening Complete  PCP or Specialist Appt within 5-7 Days Not Complete  Not Complete comments pending disposition  Home Care Screening Complete  Medication Review (RN CM) Referral to Pharmacy  Some recent data might be hidden

## 2020-02-16 NOTE — TOC Progression Note (Signed)
Transition of Care [facility]) - Progression Note    Patient Details  Name: Richard [name] MRN: [id number] Date of Birth: 1955-12-17  Transition of Care [facility]) CM/SW [place],  Phone Number: 02/16/2020, 11:48 AM  Clinical Narrative:    11:48am- CSW was contacted by PT who attempted to work with pt this morning but he had refused. Pt had told her to come back "this evening". CSW went to room and met with pt at bedside. Reintroduced self, role, reason for visit. Pt stated that he had just felt it was too early for him to get up out of bed. CSW explained that therapy's role is to get people out of bed and moving to assess their functional ability. Unless he did that we couldn't confirm what level of care he needs at discharge.   Pt states that he wants to go home, he is amenable to getting his offers which were left with him on his tray to review but he states multiple times that he wants to go home. Pt girlfriend joined pt and CSW in room during assessment. She is okay with him returning home, she is interested in a walker if needed and a 3n1.   1:47pm- CSW received a call from PT that pt is safe for d/c home with quad cane and that she answered questions around power chair that pt was asking about. CSW gave referral to in network [facility] as pt had no preference.    Expected Discharge Plan: [place] Barriers to Discharge: Continued Medical Work up, Other (comment)(re-eval by PT/DME recs)  Expected Discharge Plan and Services Expected Discharge Plan: [place] In-house Referral: Clinical Social Work Discharge Planning Services: CM Consult Post Acute Care Choice: Durable Medical Equipment, Home Health Living arrangements for the past 2 months: Single Family Home Expected Discharge Date: 02/15/20       Readmission Risk Interventions Readmission Risk Prevention Plan 02/15/2020  Transportation Screening Complete  PCP  or Specialist Appt within 5-7 Days Not Complete  Not Complete comments pending disposition  Home Care Screening Complete  Medication Review (RN CM) Referral to Pharmacy  Some recent data might be hidden

## 2020-02-16 NOTE — Discharge Instructions (Addendum)
I discussed the underwritten in detail with him.  He verbalized complete understanding.  Per [facility] statutes, patients with seizures are not allowed to drive until they have been seizure-free for six months.   Use caution when using heavy equipment or power tools. Avoid working on ladders or at heights. Take showers instead of baths. Ensure the water temperature is not too high on the home water heater. Do not go swimming alone. Do not lock yourself in a room alone (i.e. bathroom). When caring for infants or small children, sit down when holding, feeding, or changing them to minimize risk of injury to the child in the event you have a seizure. Maintain good sleep hygiene. Avoid alcohol.   If patienthas another seizure, call 911 and bring them back to the ED if: A. The seizure lasts longer than 5 minutes.  B. The patient doesn't wake shortly after the seizure or has new problems such as difficulty seeing, speaking or moving following the seizure C. The patient was injured during the seizure D. The patient has a temperature over 102 F (39C) E. The patient vomited during the seizure and now is having trouble breathing     Seizure, Adult A seizure is a sudden burst of abnormal electrical activity in the brain. Seizures usually last from 30 seconds to 2 minutes. They can cause many different symptoms. Usually, seizures are not harmful unless they last a long time. What are the causes? Common causes of this condition include:  Fever or infection.  Conditions that affect the brain, such as: ? A brain abnormality that you were born with. ? A brain or head injury. ? Bleeding in the brain. ? A tumor. ? Stroke. ? Brain disorders such as autism or cerebral palsy.  Low blood sugar.  Conditions that are passed from parent to child (are inherited).  Problems with substances, such as: ? Having a reaction to a drug or a medicine. ? Suddenly stopping the use of a  substance (withdrawal). In some cases, the cause may not be known. A person who has repeated seizures over time without a clear cause has a condition called epilepsy. What increases the risk? You are more likely to get this condition if you have:  A family history of epilepsy.  Had a seizure in the past.  A brain disorder.  A history of head injury, lack of oxygen at birth, or strokes. What are the signs or symptoms? There are many types of seizures. The symptoms vary depending on the type of seizure you have. Examples of symptoms during a seizure include:  Shaking (convulsions).  Stiffness in the body.  Passing out (losing consciousness).  Head nodding.  Staring.  Not responding to sound or touch.  Loss of bladder control and bowel control. Some people have symptoms right before and right after a seizure happens. Symptoms before a seizure may include:  Fear.  Worry (anxiety).  Feeling like you may vomit (nauseous).  Feeling like the room is spinning (vertigo).  Feeling like you saw or heard something before (dj vu).  Odd tastes or smells.  Changes in how you see. You may see flashing lights or spots. Symptoms after a seizure happens can include:  Confusion.  Sleepiness.  Headache.  Weakness on one side of the body. How is this treated? Most seizures will stop on their own in under 5 minutes. In these cases, no treatment is needed. Seizures that last longer than 5 minutes will usually need treatment. Treatment can  include:  Medicines given through an IV tube.  Avoiding things that are known to cause your seizures. These can include medicines that you take for another condition.  Medicines to treat epilepsy.  Surgery to stop the seizures. This may be needed if medicines do not help. Follow these instructions at home: Medicines  Take over-the-counter and prescription medicines only as told by your doctor.  Do not eat or drink anything that may keep  your medicine from working, such as alcohol. Activity  Do not do any activities that would be dangerous if you had another seizure, like driving or swimming. Wait until your doctor says it is safe for you to do them.  If you live in the [place] ask your local DMV (department of motor vehicles) when you can drive.  Get plenty of rest. Teaching others Teach friends and family what to do when you have a seizure. They should:  Lay you on the ground.  Protect your head and body.  Loosen any tight clothing around your neck.  Turn you on your side.  Not hold you down.  Not put anything into your mouth.  Know whether or not you need emergency care.  Stay with you until you are better.  General instructions  Contact your doctor each time you have a seizure.  Avoid anything that gives you seizures.  Keep a seizure diary. Write down: ? What you think caused each seizure. ? What you remember about each seizure.  Keep all follow-up visits as told by your doctor. This is important. Contact a doctor if:  You have another seizure.  You have seizures more often.  There is any change in what happens during your seizures.  You keep having seizures with treatment.  You have symptoms of being sick or having an infection. Get help right away if:  You have a seizure that: ? Lasts longer than 5 minutes. ? Is different than seizures you had before. ? Makes it harder to breathe. ? Happens after you hurt your head.  You have any of these symptoms after a seizure: ? Not being able to speak. ? Not being able to use a part of your body. ? Confusion. ? A bad headache.  You have two or more seizures in a row.  You do not wake up right after a seizure.  You get hurt during a seizure. These symptoms may be an emergency. Do not wait to see if the symptoms will go away. Get medical help right away. Call your local emergency services (911 in the [place] Do not drive yourself to the  hospital. Summary  Seizures usually last from 30 seconds to 2 minutes. Usually, they are not harmful unless they last a long time.  Do not eat or drink anything that may keep your medicine from working, such as alcohol.  Teach friends and family what to do when you have a seizure.  Contact your doctor each time you have a seizure. This information is not intended to replace advice given to you by your health care provider. Make sure you discuss any questions you have with your health care provider. Document Revised: 11/26/2018 Document Reviewed: 11/26/2018 [employer] Patient Education  [place].

## 2020-02-16 NOTE — Discharge Summary (Signed)
Physician Discharge Summary  Richard [name] EFE:[id number] DOB: 1956-04-27 DOA: 02/13/2020  PCP: [name], MD  Admit date: 02/13/2020 Discharge date: 02/16/2020  Admitted From: Disposition:    Recommendations for Outpatient Follow-up:  1. Follow up with PCP in 1-2 weeks 2. Please obtain BMP/CBC in one week your next doctors visit.  3. Keppra 542m po bid 4. Patient and family (daughter) notified against driving or operative heavy machinery.   Discharge Condition: Stable CODE STATUS: Full  Diet recommendation: Renal Diet.   Brief/Interim Summary: 64y.o.malewith medical history significant ofdiabetes, osteoarthritis, hypertension,CKD 5,neuropathies, previous CVA with residual right-sided weakness, migraine headaches who was brought in as a code stroke with altered mental status and left-sided weakness. Patient is not able to give any history at this point. He was seen and evaluated by neurology and the code stroke team.   CT head and MRI brain was negative for any acute pathology.  His mentation returned to baseline the next morning.  Seen by PT/OT who recommended SNF but patient refused therefore SNF arrangements were made..  TSH, B12, ammonia levels were within normal limits.  Neurology recommended Keppra therefore patient was prescribed Keppra.  Was advised against driving which I spoke with the patient and the daughter extensively about. Patient adamantly refuses dialysis or even have a temporary catheter placed.  He does not want to talk about dialysis at all.  Family is aware of this.   Assessment & Plan:   Principal Problem:   AMS (altered mental status) Active Problems:   Chronic kidney disease, stage 4 (severe) (HCC)   Tobacco abuse   Type 2 diabetes mellitus (HCC)   Hyperlipidemia   Hypertension   GERD (gastroesophageal reflux disease)   CKD (chronic kidney disease) stage 5, GFR less than 15 ml/min (HCC)   Acute metabolic encephalopathy,  unclear etiology-overall better-somewhat fluctuating CT head, MRI brain negative.  TSH, ammonia, B12 within normal limits.  PT/OT is recommending SNF.  Unclear etiology.  Mentation back to baseline at the moment. Suspicion for seizures, EEG-negative Keppra 500 mg twice daily, follow-up patient neurology in 4 weeks Seizure precautions.     CKD stage V Creatinine appears to be in range of 10-11.  Making urine, electrolytes stable for now.  Nephrology following.  Patient has been offered HD catheter placement anticipation for needing dialysis at some point soon but he refuses at this time.  We will follow-up outpatient  HLD -Resume home meds  Essential HTN -Resume home meds  DM2 -Resume home meds  Tobacco use -Counseled to quit using this.    Discharge Diagnoses:  Principal Problem:   AMS (altered mental status) Active Problems:   Chronic kidney disease, stage 4 (severe) (HCC)   Tobacco abuse   Type 2 diabetes mellitus (HCC)   Hyperlipidemia   Hypertension   GERD (gastroesophageal reflux disease)   CKD (chronic kidney disease) stage 5, GFR less than 15 ml/min (HCC)    Consultations:  Neurology  Subjective: , Comfortable eating breakfast this morning no complaints.  Discharge Exam: Vitals:   02/16/20 0637 02/16/20 1300  BP: (!) 158/83 (!) 140/94  Pulse: 65 66  Resp: 15 20  Temp: 98.4 F (36.9 C) 98.4 F (36.9 C)  SpO2: 100% 100%   Vitals:   02/15/20 1439 02/15/20 2119 02/16/20 0637 02/16/20 1300  BP: (!) 153/84 (!) 141/86 (!) 158/83 (!) 140/94  Pulse: 65 69 65 66  Resp: _0 Temp: 97.7 F (36.5 C) 98 F (36.7 C)  98.4 F (36.9 C) 98.4 F (36.9 C)  TempSrc: Oral Oral Oral Oral  SpO2: 100% 100% 100% 100%  Weight:        General: Pt is alert, awake, not in acute distress Cardiovascular: RRR, S1/S2 +, no rubs, no gallops Respiratory: CTA bilaterally, no wheezing, no rhonchi Abdominal: Soft, NT, ND, bowel sounds + Extremities: no edema, no  cyanosis Alert awake oriented to name place and this year  Discharge Instructions   Allergies as of 02/16/2020   No Known Allergies     Medication List    STOP taking these medications   sildenafil 20 MG tablet Commonly known as: REVATIO     TAKE these medications   Accu-Chek Aviva Plus test strip Generic drug: glucose blood 1 each by Other route See admin instructions. Use 1 test strip to test blood sugar two-three times daily What changed: Another medication with the same name was changed. Make sure you understand how and when to take each.   Accu-Chek Aviva Plus test strip Generic drug: glucose blood USE 1 strip TO test blood sugar 2 TIMES DAILY TO 3 TIMES DAILY What changed: See the new instructions.   amLODipine 10 MG tablet Commonly known as: NORVASC Take 1 tablet (10 mg total) by mouth daily.   blood glucose meter kit and supplies Kit Dispense based on patient and insurance preference. Check blood sugar 4 times a day What changed:   how much to take  how to take this  when to take this   calcitRIOL 0.25 MCG capsule Commonly known as: ROCALTROL Take 0.25 mcg by mouth daily.   chlorhexidine 0.12 % solution Commonly known as: PERIDEX Use as directed 15 mLs in the mouth or throat See admin instructions. Swish with 62ms for 30 seconds then spit. May use two times daily.   chlorthalidone 25 MG tablet Commonly known as: HYGROTON TAKE 1 TABLET BY MOUTH EVERY DAY   cloNIDine 0.2 MG tablet Commonly known as: CATAPRES TAKE 1 TABLET BY MOUTH 2 TIMES DAILY   clopidogrel 75 MG tablet Commonly known as: PLAVIX TAKE 1 TABLET BY MOUTH EVERY DAY   clotrimazole 1 % cream Commonly known as: LOTRIMIN Apply 1 application topically 2 (two) times daily.   diphenhydrAMINE 50 MG tablet Commonly known as: BENADRYL Take 0.5-1 tablets (25-50 mg total) by mouth 2 (two) times daily as needed for itching.   famotidine 20 MG tablet Commonly known as: Pepcid Take 1 tablet  (20 mg total) by mouth 2 (two) times daily.   furosemide 40 MG tablet Commonly known as: LASIX Take 1 tablet (40 mg total) by mouth daily.   hydrALAZINE 25 MG tablet Commonly known as: APRESOLINE Take 1 tablet (25 mg total) by mouth every 8 (eight) hours.   Insulin Pen Needle 31G X 8 MM Misc Commonly known as: Sure Comfort Pen Needles Use as directed twice daily with Novolog flex pen What changed:   how much to take  how to take this  when to take this   levETIRAcetam 500 MG tablet Commonly known as: Keppra Take 1 tablet (500 mg total) by mouth 2 (two) times daily.   Narcan 4 MG/0.1ML Liqd nasal spray kit Generic drug: naloxone Place 0.4 mg into the nose once.   NovoLOG Mix 70/30 FlexPen (70-30) 100 UNIT/ML FlexPen Generic drug: insulin aspart protamine - aspart INJECT 28 UNITS INTO THE SKIN EVERY MORNING What changed:   how much to take  how to take this  when to take this  additional instructions   Oxycodone HCl 20 MG Tabs Take 20 mg by mouth every 6 (six) hours as needed (pain).   pantoprazole 40 MG tablet Commonly known as: PROTONIX TAKE 1 TABLET BY MOUTH EVERY DAY   potassium chloride SA 20 MEQ tablet Commonly known as: KLOR-CON Take 20 mEq by mouth daily.   pravastatin 80 MG tablet Commonly known as: PRAVACHOL TAKE 1 TABLET BY MOUTH AT BEDTIME   tamsulosin 0.4 MG Caps capsule Commonly known as: FLOMAX TAKE 1 CAPSULE BY MOUTH EVERY DAY   triamcinolone cream 0.1 % Commonly known as: KENALOG Apply 1 application to affected area topically 2 times daily.   TRUEplus Insulin Syringe 31G X 5/16" 1 ML Misc Generic drug: Insulin Syringe-Needle U-100 Use pen needle with insulin 2 times daily What changed: See the new instructions.   Vitamin D 50 MCG (2000 UT) tablet TAKE 1 TABLET BY MOUTH EVERY MORNING What changed: when to take this      Follow-up Information    [name], MD. Schedule an appointment as soon as possible for a visit in  1 week(s).   Specialty: Family Medicine Contact information: [place] [address] [phone]        [facility]. Schedule an appointment as soon as possible for a visit in 2 week(s).   Contact information: [address]     [place] [address] [phone]         No Known Allergies  You were cared for by a hospitalist during your hospital stay. If you have any questions about your discharge medications or the care you received while you were in the hospital after you are discharged, you can call the unit and asked to speak with the hospitalist on call if the hospitalist that took care of you is not available. Once you are discharged, your primary care physician will handle any further medical issues. Please note that no refills for any discharge medications will be authorized once you are discharged, as it is imperative that you return to your primary care physician (or establish a relationship with a primary care physician if you do not have one) for your aftercare needs so that they can reassess your need for medications and monitor your lab values.   Procedures/Studies: EEG  Result Date: 02/15/2020 Lora Havens, MD     02/15/2020  8:23 AM Patient Name: Richard [name] MRN: [id number] Epilepsy Attending: Lora Havens Referring Physician/Provider: Etta Quill, PA Date: 02/14/2020 Duration: 24.25 minutes Patient history: 64 year old male presented with sudden onset unresponsiveness however had started to become more responsive on arrival.  MRI brain negative for acute stroke.  EEG to assess for seizures. Level of alertness: Awake, asleep AEDs during EEG study: Keppra Technical aspects: This EEG study was done with scalp electrodes positioned according to the 10-20 International system of electrode placement. Electrical activity was acquired at a sampling rate of _0  and reviewed with a high frequency  filter of _1  and a low frequency filter of _2 . EEG data were recorded continuously and digitally stored. Description: The posterior dominant rhythm consists of 9 Hz activity of moderate voltage (25-35 uV) seen predominantly in posterior head regions, symmetric and reactive to eye opening and eye closing. Sleep was characterized by vertex waves, sleep spindles (12 to 14 Hz), maximal frontocentral region. Hyperventilation and photic stimulation were not performed.   IMPRESSION: This study is within normal limits. No seizures or epileptiform discharges were seen  throughout the recording. Lora Havens   [name]  Result Date: 02/13/2020 CLINICAL DATA:  Encephalopathy EXAM: MRI HEAD WITHOUT CONTRAST TECHNIQUE: Multiplanar, multiecho pulse sequences of the brain and surrounding structures were obtained without intravenous contrast. COMPARISON:  08/24/2012 FINDINGS: Brain: No acute infarct, acute hemorrhage or extra-axial collection. Multifocal white matter hyperintensity, most commonly due to chronic ischemic microangiopathy. Old left basal ganglia small vessel infarct. Left frontal pole arachnoid cyst. Mild generalized atrophy. Single focus of left cerebellar chronic microhemorrhage. Normal midline structures. Vascular: Normal flow voids. Skull and upper cervical spine: Normal marrow signal. Sinuses/Orbits: Negative. Other: None. IMPRESSION: 1. No acute intracranial abnormality. 2. Old left basal ganglia small vessel infarct. 3. Mild generalized atrophy and chronic ischemic microangiopathy. Electronically Signed   By: Ulyses Jarred M.D.   On: 02/13/2020 21:12   DG Chest Port 1 View  Result Date: 02/13/2020 CLINICAL DATA:  Altered mental status EXAM: PORTABLE CHEST 1 VIEW COMPARISON:  06/22/2019 FINDINGS: Borderline to mild cardiomegaly. No edema, pleural effusion, pneumothorax or focal airspace disease IMPRESSION: No active disease.  Borderline to mild cardiomegaly Electronically Signed   By:  Donavan Foil M.D.   On: 02/13/2020 20:10   ECHOCARDIOGRAM COMPLETE  Result Date: 02/14/2020    ECHOCARDIOGRAM REPORT   Patient Name:   Richard [name] Date of Exam: 02/14/2020 Medical Rec #:  [id number]             Height:       72.0 in Accession #:    [id number]            Weight:       150.8 lb Date of Birth:  10/16/1955             BSA:          1.890 m Patient Age:    11 years              BP:           172/88 mmHg Patient Gender: M                     HR:           69 bpm. Exam Location:  Inpatient Procedure: 2D Echo, Cardiac Doppler and Color Doppler Indications:    TIA  History:        Patient has prior history of Echocardiogram examinations, most                 recent 05/23/2012. Risk Factors:Dyslipidemia, Diabetes, Current                 Smoker and Hypertension. CKD. GERD.  Sonographer:    Clayton Lefort RDCS (AE) Referring Phys: [place]  1. Left ventricular ejection fraction, by estimation, is 55%. The left ventricle has normal function. The left ventricle has no regional wall motion abnormalities. There is moderate left ventricular hypertrophy. Left ventricular diastolic parameters are  consistent with Grade I diastolic dysfunction (impaired relaxation).  2. Right ventricular systolic function is normal. The right ventricular size is normal. Tricuspid regurgitation signal is inadequate for assessing PA pressure.  3. Left atrial size was mildly dilated.  4. The mitral valve is normal in structure. No evidence of mitral valve regurgitation. No evidence of mitral stenosis.  5. The aortic valve is tricuspid. Aortic valve regurgitation is not visualized. No aortic stenosis is present.  6. The inferior vena cava is normal in size with greater  than 50% respiratory variability, suggesting right atrial pressure of 3 mmHg. FINDINGS  Left Ventricle: Left ventricular ejection fraction, by estimation, is 55%. The left ventricle has normal function. The left ventricle has no regional wall  motion abnormalities. The left ventricular internal cavity size was normal in size. There is moderate left ventricular hypertrophy. Left ventricular diastolic parameters are consistent with Grade I diastolic dysfunction (impaired relaxation). Right Ventricle: The right ventricular size is normal. No increase in right ventricular wall thickness. Right ventricular systolic function is normal. Tricuspid regurgitation signal is inadequate for assessing PA pressure. Left Atrium: Left atrial size was mildly dilated. Right Atrium: Right atrial size was normal in size. Pericardium: Trivial pericardial effusion is present. Mitral Valve: The mitral valve is normal in structure. No evidence of mitral valve regurgitation. No evidence of mitral valve stenosis. MV peak gradient, 5.4 mmHg. The mean mitral valve gradient is 2.0 mmHg. Tricuspid Valve: The tricuspid valve is normal in structure. Tricuspid valve regurgitation is not demonstrated. Aortic Valve: The aortic valve is tricuspid. Aortic valve regurgitation is not visualized. No aortic stenosis is present. Aortic valve mean gradient measures 2.0 mmHg. Aortic valve peak gradient measures 4.7 mmHg. Aortic valve area, by VTI measures 3.70 cm. Pulmonic Valve: The pulmonic valve was normal in structure. Pulmonic valve regurgitation is not visualized. No evidence of pulmonic stenosis. Aorta: The aortic root is normal in size and structure. Venous: The inferior vena cava is normal in size with greater than 50% respiratory variability, suggesting right atrial pressure of 3 mmHg. IAS/Shunts: No atrial level shunt detected by color flow Doppler.  LEFT VENTRICLE PLAX 2D LVIDd:         4.61 cm      Diastology LVIDs:         3.53 cm      LV e' lateral:   3.23 cm/s LV PW:         1.81 cm      LV E/e' lateral: 20.5 LV IVS:        1.34 cm      LV e' medial:    2.89 cm/s LVOT diam:     2.20 cm      LV E/e' medial:  22.9 LV SV:         67 LV SV Index:   36 LVOT Area:     3.80 cm                               3D Volume EF: LV Volumes (MOD)            3D EF:        43 % LV vol d, MOD A2C: 128.0 ml LV EDV:       275 ml LV vol d, MOD A4C: 109.0 ml LV ESV:       157 ml LV vol s, MOD A2C: 80.6 ml  LV SV:        117 ml LV vol s, MOD A4C: 71.2 ml LV SV MOD A2C:     47.4 ml LV SV MOD A4C:     109.0 ml LV SV MOD BP:      36.3 ml RIGHT VENTRICLE             IVC RV Basal diam:  2.49 cm     IVC diam: 1.81 cm RV S prime:     13.10 cm/s TAPSE (M-mode): 2.8 cm LEFT ATRIUM  Index       RIGHT ATRIUM           Index LA diam:        3.40 cm 1.80 cm/m  RA Area:     17.10 cm LA Vol (A2C):   95.8 ml 50.70 ml/m RA Volume:   42.70 ml  22.60 ml/m LA Vol (A4C):   40.4 ml 21.38 ml/m LA Biplane Vol: 68.3 ml 36.14 ml/m  AORTIC VALVE AV Area (Vmax):    3.25 cm AV Area (Vmean):   2.92 cm AV Area (VTI):     3.70 cm AV Vmax:           108.00 cm/s AV Vmean:          74.100 cm/s AV VTI:            0.182 m AV Peak Grad:      4.7 mmHg AV Mean Grad:      2.0 mmHg LVOT Vmax:         92.40 cm/s LVOT Vmean:        56.900 cm/s LVOT VTI:          0.177 m LVOT/AV VTI ratio: 0.97  AORTA Ao Root diam: 3.40 cm Ao Asc diam:  3.00 cm MITRAL VALVE MV Area (PHT): 2.48 cm     SHUNTS MV Peak grad:  5.4 mmHg     Systemic VTI:  0.18 m MV Mean grad:  2.0 mmHg     Systemic Diam: 2.20 cm MV Vmax:       1.16 m/s MV Vmean:      57.6 cm/s MV Decel Time: 306 msec MV E velocity: 66.10 cm/s MV A velocity: 117.00 cm/s MV E/A ratio:  0.56 Loralie Champagne MD Electronically signed by Loralie Champagne MD Signature Date/Time: 02/14/2020/4:49:10 PM    Final    CT HEAD CODE STROKE WO CONTRAST  Result Date: 02/13/2020 CLINICAL DATA:  Code stroke.  Unresponsive. EXAM: CT HEAD WITHOUT CONTRAST TECHNIQUE: Contiguous axial images were obtained from the base of the skull through the vertex without intravenous contrast. COMPARISON:  01/23/2018 FINDINGS: The study is mildly motion degraded. Brain: No acute cortically based infarct, intracranial hemorrhage, mass,  midline shift, or significant extra-axial fluid collection is identified. Patchy hypodensities in the cerebral white matter bilaterally have progressed from the prior study and are nonspecific but compatible with moderate chronic small vessel ischemic disease. A chronic infarct is again noted in the left corona radiata and external capsule. The ventricles are within normal limits for age. Focally enlarged extra-axial CSF space anteriorly over the left frontal lobe is unchanged. Vascular: Calcified atherosclerosis at the skull base. No hyperdense vessel. Skull: No fracture or suspicious osseous lesion. Sinuses/Orbits: Old medial left orbital fracture. The visualized paranasal sinuses and mastoid air cells are clear. Other: None. ASPECTS [facility] Stroke Program Early CT Score) Not scored given lack of provided localizing symptoms. IMPRESSION: 1. No evidence of acute intracranial abnormality. 2. Moderate chronic small vessel ischemic disease, progressed from 2019. These results were communicated to Dr. Lorraine Lax at 5:47 pm on 02/13/2020 by text page via the [facility] messaging system. Electronically Signed   By: Logan Bores M.D.   On: 02/13/2020 17:48   VAS US CAROTID (at [facility] and [facility] only)  Result Date: 02/15/2020 Carotid Arterial Duplex Study Indications:       TIA. Risk Factors:      Hypertension, Diabetes. Comparison Study:  05/22/12 Performing Technologist: June Leap RDMS, RVT  Examination Guidelines: A complete  evaluation includes B-mode imaging, spectral Doppler, color Doppler, and power Doppler as needed of all accessible portions of each vessel. Bilateral testing is considered an integral part of a complete examination. Limited examinations for reoccurring indications may be performed as noted.  Right Carotid Findings: +----------+--------+--------+--------+------------------+--------+           PSV cm/sEDV cm/sStenosisPlaque DescriptionComments  +----------+--------+--------+--------+------------------+--------+ CCA Prox  85      11                                         +----------+--------+--------+--------+------------------+--------+ CCA Distal58      9                                          +----------+--------+--------+--------+------------------+--------+ ICA Prox  66      17              heterogenous               +----------+--------+--------+--------+------------------+--------+ ICA Distal58      15                                         +----------+--------+--------+--------+------------------+--------+ ECA       80      7               heterogenous               +----------+--------+--------+--------+------------------+--------+ +----------+--------+-------+----------------+-------------------+           PSV cm/sEDV cmsDescribe        Arm Pressure (mmHG) +----------+--------+-------+----------------+-------------------+ [id number]            Multiphasic, WNL                    +----------+--------+-------+----------------+-------------------+ +---------+--------+--+--------+-+---------+ VertebralPSV cm/s43EDV cm/s9Antegrade +---------+--------+--+--------+-+---------+  Left Carotid Findings: +----------+--------+--------+--------+------------------+--------+           PSV cm/sEDV cm/sStenosisPlaque DescriptionComments +----------+--------+--------+--------+------------------+--------+ CCA Prox  80      14                                         +----------+--------+--------+--------+------------------+--------+ CCA Distal83      16                                         +----------+--------+--------+--------+------------------+--------+ ICA Prox  69      16      1-39%   heterogenous               +----------+--------+--------+--------+------------------+--------+ ICA Distal70      19                                          +----------+--------+--------+--------+------------------+--------+ ECA       158     14                                         +----------+--------+--------+--------+------------------+--------+ +----------+--------+--------+----------------+-------------------+  PSV cm/sEDV cm/sDescribe        Arm Pressure (mmHG) +----------+--------+--------+----------------+-------------------+ [id number]              Multiphasic, WNL                    +----------+--------+--------+----------------+-------------------+ +---------+--------+--+--------+--+---------+ VertebralPSV cm/s42EDV cm/s11Antegrade +---------+--------+--+--------+--+---------+   Summary: Right Carotid: Velocities in the right ICA are consistent with a 1-39% stenosis. Left Carotid: Velocities in the left ICA are consistent with a 1-39% stenosis. Vertebrals:  Bilateral vertebral arteries demonstrate antegrade flow. Subclavians: Normal flow hemodynamics were seen in bilateral subclavian              arteries. *See table(s) above for measurements and observations.  Electronically signed by Antony Contras MD on 02/15/2020 at 2:46:37 PM.    Final       The results of significant diagnostics from this hospitalization (including imaging, microbiology, ancillary and laboratory) are listed below for reference.     Microbiology: Recent Results (from the past 240 hour(s))  SARS Coronavirus 2 by RT PCR (hospital order, performed in [facility] hospital lab) Nasopharyngeal Nasopharyngeal Swab     Status: None   Collection Time: 02/13/20  5:21 PM   Specimen: Nasopharyngeal Swab  Result Value Ref Range Status   SARS Coronavirus 2 NEGATIVE NEGATIVE Final    Comment: (NOTE) SARS-CoV-2 target nucleic acids are NOT DETECTED. The SARS-CoV-2 RNA is generally detectable in upper and lower respiratory specimens during the acute phase of infection. The lowest concentration of SARS-CoV-2 viral copies this assay can detect is  250 copies / mL. A negative result does not preclude SARS-CoV-2 infection and should not be used as the sole basis for treatment or other patient management decisions.  A negative result may occur with improper specimen collection / handling, submission of specimen other than nasopharyngeal swab, presence of viral mutation(s) within the areas targeted by this assay, and inadequate number of viral copies (<250 copies / mL). A negative result must be combined with clinical observations, patient history, and epidemiological information. Fact Sheet for Patients:   StrictlyIdeas.no Fact Sheet for Healthcare Providers: BankingDealers.co.za This test is not yet approved or cleared  by the [place] FDA and has been authorized for detection and/or diagnosis of SARS-CoV-2 by FDA under an Emergency Use Authorization (EUA).  This EUA will remain in effect (meaning this test can be used) for the duration of the COVID-19 declaration under Section 564(b)(1) of the Act, 21 U.S.C. section 360bbb-3(b)(1), unless the authorization is terminated or revoked sooner. Performed at [facility], [place] [address] [address] [place], [place] [address]      Labs: BNP (last 3 results) No results for input(s): BNP in the last 8760 hours. Basic Metabolic Panel: Recent Labs  Lab 02/13/20 1721 02/13/20 1737 02/14/20 0934 02/15/20 0151 02/16/20 0327  NA 141 140 141 139  --   K 3.9 3.8 3.3* 3.7  --   CL 111 112* 113* 111  --   CO2 13*  --  17* 16*  --   GLUCOSE 113* 108* 108* 119*  --   BUN 64* 55* 64* 64*  --   CREATININE 10.90* 11.40* 10.98* 10.21*  --   CALCIUM 8.4*  --  8.2* 7.7*  7.4*  --   MG  --   --   --  1.4* 2.3  PHOS  --   --   --  5.1*  --    Liver Function Tests: Recent Labs  Lab 02/13/20  1721 02/14/20 0934 02/15/20 0151  AST 12* 20  --   ALT 10 11  --   ALKPHOS 89 83  --   BILITOT 0.7 0.6  --   PROT 6.3* 5.8*  --   ALBUMIN 3.0* 2.7*  2.4*   No results for input(s): LIPASE, AMYLASE in the last 168 hours. Recent Labs  Lab 02/14/20 0931  AMMONIA 27   CBC: Recent Labs  Lab 02/13/20 1721 02/13/20 1737 02/14/20 0934 02/15/20 0151 02/16/20 0327  WBC 5.4  --  4.7 4.2 4.3  NEUTROABS 4.3  --   --   --   --   HGB 10.5* 10.9* 9.3* 8.5* 10.1*  HCT 32.4* 32.0* 27.9* 25.8* 30.0*  MCV 93.4  --  91.5 90.8 92.0  PLT 141*  --  175 145* 143*   Cardiac Enzymes: No results for input(s): CKTOTAL, CKMB, CKMBINDEX, TROPONINI in the last 168 hours. BNP: Invalid input(s): POCBNP CBG: Recent Labs  Lab 02/15/20 1151 02/15/20 1707 02/15/20 2124 02/16/20 0757 02/16/20 1158  GLUCAP 97 127* 148* 95 122*   D-Dimer No results for input(s): DDIMER in the last 72 hours. Hgb A1c Recent Labs    02/14/20 0934  HGBA1C 5.2   Lipid Profile Recent Labs    02/14/20 0934  CHOL 147  HDL 31*  LDLCALC 91  TRIG 125  CHOLHDL 4.7   Thyroid function studies Recent Labs    02/14/20 0931  TSH 0.875   Anemia work up Recent Labs    02/14/20 0931 02/15/20 0151  VITAMINB12 232  --   FERRITIN  --  513*  TIBC  --  181*  IRON  --  40*   Urinalysis    Component Value Date/Time   COLORURINE YELLOW 03/18/2017 1656   APPEARANCEUR CLEAR 03/18/2017 1656   LABSPEC 1.024 03/18/2017 1656   PHURINE 5.5 03/18/2017 1656   GLUCOSEU >=500 (A) 03/18/2017 1656   HGBUR NEGATIVE 03/18/2017 1656   BILIRUBINUR NEGATIVE 03/18/2017 1656   KETONESUR NEGATIVE 03/18/2017 1656   PROTEINUR 30 (A) 03/18/2017 1656   UROBILINOGEN 0.2 11/29/2014 2003   NITRITE NEGATIVE 03/18/2017 1656   LEUKOCYTESUR NEGATIVE 03/18/2017 1656   Sepsis Labs Invalid input(s): PROCALCITONIN,  WBC,  LACTICIDVEN Microbiology Recent Results (from the past 240 hour(s))  SARS Coronavirus 2 by RT PCR (hospital order, performed in [facility] hospital lab) Nasopharyngeal Nasopharyngeal Swab     Status: None   Collection Time: 02/13/20  5:21 PM   Specimen: Nasopharyngeal  Swab  Result Value Ref Range Status   SARS Coronavirus 2 NEGATIVE NEGATIVE Final    Comment: (NOTE) SARS-CoV-2 target nucleic acids are NOT DETECTED. The SARS-CoV-2 RNA is generally detectable in upper and lower respiratory specimens during the acute phase of infection. The lowest concentration of SARS-CoV-2 viral copies this assay can detect is 250 copies / mL. A negative result does not preclude SARS-CoV-2 infection and should not be used as the sole basis for treatment or other patient management decisions.  A negative result may occur with improper specimen collection / handling, submission of specimen other than nasopharyngeal swab, presence of viral mutation(s) within the areas targeted by this assay, and inadequate number of viral copies (<250 copies / mL). A negative result must be combined with clinical observations, patient history, and epidemiological information. Fact Sheet for Patients:   StrictlyIdeas.no Fact Sheet for Healthcare Providers: BankingDealers.co.za This test is not yet approved or cleared  by the [place] FDA and has been authorized for detection and/or  diagnosis of SARS-CoV-2 by FDA under an Emergency Use Authorization (EUA).  This EUA will remain in effect (meaning this test can be used) for the duration of the COVID-19 declaration under Section 564(b)(1) of the Act, 21 U.S.C. section 360bbb-3(b)(1), unless the authorization is terminated or revoked sooner. Performed at [facility], [place] [address], [place], [place] [address]      Time coordinating discharge:  I have spent 35 minutes face to face with the patient and on the ward discussing the patients care, assessment, plan and disposition with other care givers. >50% of the time was devoted counseling the patient about the risks and benefits of treatment/Discharge disposition and coordinating care.   SIGNED:   Damita Lack, MD  [facility] 02/16/2020, 2:49 PM   If 7PM-7AM, please contact night-coverage

## 2020-02-16 NOTE — Progress Notes (Signed)
Discharged  Patient to home, AVS given and explained to patient and daughter at bedside. Quad cane delivered to room. Concerns addressed to patient's satisfaction. Belongings returned accordingly.

## 2020-02-17 ENCOUNTER — Telehealth: Payer: Self-pay | Admitting: *Deleted

## 2020-02-17 NOTE — Telephone Encounter (Signed)
Attempted to contact patient. No answer and Clinic RN unable to LVM.

## 2020-02-21 ENCOUNTER — Telehealth: Payer: Self-pay | Admitting: Family Medicine

## 2020-02-21 NOTE — Telephone Encounter (Signed)
Pt is needing a refill on Oxycodone. The prior doctor who prescribed him the prescription told him to see if his PCP will take it over.  Medication Refill:  Oxycodone  Pharmacy: [facility]: [phone]   Pt stated his phone does not get turned on until Thursday but he will call tomorrow around 2pm to check on PCP decision.

## 2020-02-22 ENCOUNTER — Telehealth: Payer: Self-pay | Admitting: Family Medicine

## 2020-02-22 DIAGNOSIS — E785 Hyperlipidemia, unspecified: Secondary | ICD-10-CM | POA: Diagnosis not present

## 2020-02-22 DIAGNOSIS — N185 Chronic kidney disease, stage 5: Secondary | ICD-10-CM | POA: Diagnosis not present

## 2020-02-22 DIAGNOSIS — E1122 Type 2 diabetes mellitus with diabetic chronic kidney disease: Secondary | ICD-10-CM | POA: Diagnosis not present

## 2020-02-22 DIAGNOSIS — I1311 Hypertensive heart and chronic kidney disease without heart failure, with stage 5 chronic kidney disease, or end stage renal disease: Secondary | ICD-10-CM | POA: Diagnosis not present

## 2020-02-22 DIAGNOSIS — I69351 Hemiplegia and hemiparesis following cerebral infarction affecting right dominant side: Secondary | ICD-10-CM | POA: Diagnosis not present

## 2020-02-22 DIAGNOSIS — K219 Gastro-esophageal reflux disease without esophagitis: Secondary | ICD-10-CM | POA: Diagnosis not present

## 2020-02-22 DIAGNOSIS — M5136 Other intervertebral disc degeneration, lumbar region: Secondary | ICD-10-CM | POA: Diagnosis not present

## 2020-02-22 DIAGNOSIS — E1142 Type 2 diabetes mellitus with diabetic polyneuropathy: Secondary | ICD-10-CM | POA: Diagnosis not present

## 2020-02-22 DIAGNOSIS — M1991 Primary osteoarthritis, unspecified site: Secondary | ICD-10-CM | POA: Diagnosis not present

## 2020-02-22 NOTE — Telephone Encounter (Signed)
ok 

## 2020-02-22 NOTE — Telephone Encounter (Signed)
The patient used to have his Rx prescribed to him by [facility] but they are not prescribing medication any longer and he was told to contact his doctor to start having these Rx refilled.  [facility]  [phone]  Oxycodone HCl 20 MG TABS  pantoprazole (PROTONIX) 40 MG tablet  Viagra - Sildenafil / I didn't see this listed in his medication list  [id number] [address], [address] Phone:  [phone]  Fax:  [phone]

## 2020-02-22 NOTE — Telephone Encounter (Signed)
If we are unable to reach him we will have to send letter. Please try again tomorrow. I will not take over oxycodone prescribing. Not sure why they aren't writing prescriptions for him any longer? We would need to make sure there was no breech of contract for him because if so, then it would be very difficult for him to get under another pain mgmt care. He is overdue for in office visit, esp since he was recently hospitalized. I do not feel comfortable refilling medications until he has follow up in office (except for the protonix I am ok with).

## 2020-02-22 NOTE — Telephone Encounter (Signed)
No answer at the pts cell number.

## 2020-02-22 NOTE — Telephone Encounter (Signed)
No; he needs to see pain management for that. He is also due for hospital follow up.

## 2020-02-22 NOTE — Telephone Encounter (Signed)
Richard [name] from [facility] needs verbal orders   PT 1 week for 9 weeks  Nurse eval for diease management  Social Work Consult to [employer] back [phone]

## 2020-02-23 ENCOUNTER — Telehealth: Payer: Self-pay | Admitting: Family Medicine

## 2020-02-23 ENCOUNTER — Encounter: Payer: Self-pay | Admitting: Surgery

## 2020-02-23 NOTE — Progress Notes (Signed)
  Chronic Care Management   Outreach Note  02/23/2020 Name: Richard [name] MRN: [id number] DOB: [birth year]  Referred by: [name], MD Reason for referral : No chief complaint on file.   Third unsuccessful telephone outreach was attempted today. The patient was referred to the pharmacist for assistance with care management and care coordination.   Follow Up Plan:   Prathima Ghanta [facility] Scheduler

## 2020-02-23 NOTE — Telephone Encounter (Signed)
I left a detailed message on Kate's voicemail with approval for verbal orders as below.

## 2020-02-23 NOTE — Telephone Encounter (Signed)
Patient called back and was informed of the message below.  Appt scheduled for 6/7 to arrive at 10:45am.

## 2020-02-24 ENCOUNTER — Telehealth: Payer: Self-pay | Admitting: Family Medicine

## 2020-02-24 DIAGNOSIS — K219 Gastro-esophageal reflux disease without esophagitis: Secondary | ICD-10-CM | POA: Diagnosis not present

## 2020-02-24 DIAGNOSIS — M5136 Other intervertebral disc degeneration, lumbar region: Secondary | ICD-10-CM | POA: Diagnosis not present

## 2020-02-24 DIAGNOSIS — I69351 Hemiplegia and hemiparesis following cerebral infarction affecting right dominant side: Secondary | ICD-10-CM | POA: Diagnosis not present

## 2020-02-24 DIAGNOSIS — M1991 Primary osteoarthritis, unspecified site: Secondary | ICD-10-CM | POA: Diagnosis not present

## 2020-02-24 DIAGNOSIS — I1311 Hypertensive heart and chronic kidney disease without heart failure, with stage 5 chronic kidney disease, or end stage renal disease: Secondary | ICD-10-CM | POA: Diagnosis not present

## 2020-02-24 DIAGNOSIS — E1142 Type 2 diabetes mellitus with diabetic polyneuropathy: Secondary | ICD-10-CM | POA: Diagnosis not present

## 2020-02-24 DIAGNOSIS — N185 Chronic kidney disease, stage 5: Secondary | ICD-10-CM | POA: Diagnosis not present

## 2020-02-24 DIAGNOSIS — E1122 Type 2 diabetes mellitus with diabetic chronic kidney disease: Secondary | ICD-10-CM | POA: Diagnosis not present

## 2020-02-24 DIAGNOSIS — E785 Hyperlipidemia, unspecified: Secondary | ICD-10-CM | POA: Diagnosis not present

## 2020-02-24 MED ORDER — TRANSFER BENCH MISC: 0 refills | Status: DC

## 2020-02-24 MED ORDER — COMMODE 3-IN-1 MISC: 0 refills | Status: DC

## 2020-02-24 NOTE — Addendum Note (Signed)
Addended by: Agnes Lawrence on: 02/24/2020 03:19 PM   Modules accepted: Orders

## 2020-02-24 NOTE — Telephone Encounter (Signed)
[place] for orders; we might need visit for the med equipement? But we can try to write in meanwhile. [place] for all orders suggested. He has not been compliant in the past and we have reviewed risks of noncompliance as well.

## 2020-02-24 NOTE — Telephone Encounter (Signed)
Izora Gala OT with [facility] for verbal orders for OT 1 week 9 for ADL training  Also wants to know if Dr. Ethlyn Gallery can write a Rx for 3N1 commode and a transfer tub bench Fax [place] at: [phone]  When she was there today the patient had extreme high BP and he hasn't taken  His 3 BP medication  He wasn't concerned about it at all that he knew that it would come down. He had gone at least a day and a half without taking his BP medication  225/123 at noon  218/115 before she left  She did explain the risks of not taking his medication as prescribed.  Izora Gala [phone] [facility]

## 2020-02-24 NOTE — Telephone Encounter (Signed)
Spoke with Richard [name] and informed her of the orders below.  Richard [name] is also aware the Rxs were printed for the DME supplies and faxed to [employer] at the number below with demographics.

## 2020-02-25 DIAGNOSIS — E1122 Type 2 diabetes mellitus with diabetic chronic kidney disease: Secondary | ICD-10-CM | POA: Diagnosis not present

## 2020-02-25 DIAGNOSIS — K219 Gastro-esophageal reflux disease without esophagitis: Secondary | ICD-10-CM | POA: Diagnosis not present

## 2020-02-25 DIAGNOSIS — E1142 Type 2 diabetes mellitus with diabetic polyneuropathy: Secondary | ICD-10-CM | POA: Diagnosis not present

## 2020-02-25 DIAGNOSIS — E785 Hyperlipidemia, unspecified: Secondary | ICD-10-CM | POA: Diagnosis not present

## 2020-02-25 DIAGNOSIS — M5136 Other intervertebral disc degeneration, lumbar region: Secondary | ICD-10-CM | POA: Diagnosis not present

## 2020-02-25 DIAGNOSIS — I1311 Hypertensive heart and chronic kidney disease without heart failure, with stage 5 chronic kidney disease, or end stage renal disease: Secondary | ICD-10-CM | POA: Diagnosis not present

## 2020-02-25 DIAGNOSIS — I69351 Hemiplegia and hemiparesis following cerebral infarction affecting right dominant side: Secondary | ICD-10-CM | POA: Diagnosis not present

## 2020-02-25 DIAGNOSIS — N185 Chronic kidney disease, stage 5: Secondary | ICD-10-CM | POA: Diagnosis not present

## 2020-02-25 DIAGNOSIS — M1991 Primary osteoarthritis, unspecified site: Secondary | ICD-10-CM | POA: Diagnosis not present

## 2020-02-27 ENCOUNTER — Ambulatory Visit: Payer: Medicare HMO | Admitting: Family Medicine

## 2020-02-27 ENCOUNTER — Telehealth: Payer: Self-pay | Admitting: Family Medicine

## 2020-02-27 NOTE — Progress Notes (Deleted)
  Richard [name] DOB: [birth year] Encounter date: 02/27/2020  This is a 64 y.o. male who presents with No chief complaint on file.   History of present illness: Patient was recently admitted with diagnosis of acute metabolic encephalopathy.  CT head and MRI of brain were negative.  Question of seizures, but EEG was negative.  Patient was started on Keppra 500 mg twice daily and instructed to follow-up with neurology.  He was discharged on 02/16/2020.  Additional concerns discussed for his chronic kidney disease which is at a stage V.  Patient has not followed up to discuss dialysis or get a port placement.  Recently patient was calling in to get pain medications filled from primary stating that his pain management doctor was no longer filling medications for him:  Has follow-up in the office has been intermittent and he has not been compliant with his treatments and suggestions.  Blood sugars and blood pressures have not been well controlled historically.  HPI   No Known Allergies No outpatient medications have been marked as taking for the 02/27/20 encounter (Appointment) with [name], MD.    Review of Systems  Objective:  There were no vitals taken for this visit.      BP Readings from Last 3 Encounters:  02/16/20 (!) 140/94  09/14/19 (!) 162/92  06/22/19 140/80   Wt Readings from Last 3 Encounters:  02/13/20 150 lb 12.8 oz (68.4 kg)  09/14/19 164 lb 9.6 oz (74.7 kg)  06/22/19 182 lb 1.6 oz (82.6 kg)    Physical Exam  Assessment/Plan  There are no diagnoses linked to this encounter.       [name], MD

## 2020-02-27 NOTE — Telephone Encounter (Signed)
Left a message for Richard [name] to return my call.

## 2020-02-27 NOTE — Telephone Encounter (Signed)
[name] with [facility], is requesting to speak to you regarding pt. Richard [name], was going in to a pt's house and did not give me any details and said, she would speak to you later today. Thanks

## 2020-02-28 DIAGNOSIS — K219 Gastro-esophageal reflux disease without esophagitis: Secondary | ICD-10-CM | POA: Diagnosis not present

## 2020-02-28 DIAGNOSIS — M1991 Primary osteoarthritis, unspecified site: Secondary | ICD-10-CM | POA: Diagnosis not present

## 2020-02-28 DIAGNOSIS — E785 Hyperlipidemia, unspecified: Secondary | ICD-10-CM | POA: Diagnosis not present

## 2020-02-28 DIAGNOSIS — E1142 Type 2 diabetes mellitus with diabetic polyneuropathy: Secondary | ICD-10-CM | POA: Diagnosis not present

## 2020-02-28 DIAGNOSIS — I69351 Hemiplegia and hemiparesis following cerebral infarction affecting right dominant side: Secondary | ICD-10-CM | POA: Diagnosis not present

## 2020-02-28 DIAGNOSIS — N185 Chronic kidney disease, stage 5: Secondary | ICD-10-CM | POA: Diagnosis not present

## 2020-02-28 DIAGNOSIS — M5136 Other intervertebral disc degeneration, lumbar region: Secondary | ICD-10-CM | POA: Diagnosis not present

## 2020-02-28 DIAGNOSIS — E1122 Type 2 diabetes mellitus with diabetic chronic kidney disease: Secondary | ICD-10-CM | POA: Diagnosis not present

## 2020-02-28 DIAGNOSIS — I1311 Hypertensive heart and chronic kidney disease without heart failure, with stage 5 chronic kidney disease, or end stage renal disease: Secondary | ICD-10-CM | POA: Diagnosis not present

## 2020-02-28 NOTE — Telephone Encounter (Signed)
[name] called back and stated to let Dr Ethlyn Gallery know nursing has been added and she would like an approval-for 1x a week for 1 week and 2x a week for 4 weeks and once a week for 6 weeks due to HTN, CKD, diabetes and she stated the pt has declined dialysis and she attempted to explain the risk of this.  Kenney Houseman stated a Education officer, museum is due to see the pt on today to help patient with living and transportation assistance.  Also stated the pt is out of a lot of medications as he should be taking Keppra for seizures after hospital discharge and he told her he does not have transportation to get to the pharmacy and does not have Hydralazine, Pravastatin and creams either.  Message sent to PCP.

## 2020-02-29 ENCOUNTER — Telehealth: Payer: Self-pay | Admitting: Family Medicine

## 2020-02-29 DIAGNOSIS — M1991 Primary osteoarthritis, unspecified site: Secondary | ICD-10-CM | POA: Diagnosis not present

## 2020-02-29 DIAGNOSIS — E1142 Type 2 diabetes mellitus with diabetic polyneuropathy: Secondary | ICD-10-CM | POA: Diagnosis not present

## 2020-02-29 DIAGNOSIS — N185 Chronic kidney disease, stage 5: Secondary | ICD-10-CM | POA: Diagnosis not present

## 2020-02-29 DIAGNOSIS — I69351 Hemiplegia and hemiparesis following cerebral infarction affecting right dominant side: Secondary | ICD-10-CM | POA: Diagnosis not present

## 2020-02-29 DIAGNOSIS — E785 Hyperlipidemia, unspecified: Secondary | ICD-10-CM | POA: Diagnosis not present

## 2020-02-29 DIAGNOSIS — I1311 Hypertensive heart and chronic kidney disease without heart failure, with stage 5 chronic kidney disease, or end stage renal disease: Secondary | ICD-10-CM | POA: Diagnosis not present

## 2020-02-29 DIAGNOSIS — M5136 Other intervertebral disc degeneration, lumbar region: Secondary | ICD-10-CM | POA: Diagnosis not present

## 2020-02-29 DIAGNOSIS — K219 Gastro-esophageal reflux disease without esophagitis: Secondary | ICD-10-CM | POA: Diagnosis not present

## 2020-02-29 DIAGNOSIS — E1122 Type 2 diabetes mellitus with diabetic chronic kidney disease: Secondary | ICD-10-CM | POA: Diagnosis not present

## 2020-02-29 NOTE — Telephone Encounter (Signed)
Spoke with Kenney Houseman and she stated the pt was seen by the social worker yesterday.  Stated the pt agreed to go to SNF and she is seeking placement through pts insurance.  Stated the pt wants to live somewhere else as he has been evicted and Education officer, museum gave info on housing that he can consider after SNF.  Also stated the pt has Medicaid and advised once he is in the new home he qualifies for PCS.  Social worker also helping with transportation through [place] and gave instructions that the pt can use to get transportation through [place].  Message sent to PCP and she also stated the social worker will send notes via fax.  Kenney Houseman stated the pts new phone number is [phone].

## 2020-02-29 NOTE — Telephone Encounter (Signed)
Noted  

## 2020-02-29 NOTE — Telephone Encounter (Signed)
I'm not sure what I can do to help if he is not following up for his appointments (no showed for Monday).   Perhaps social worker can help with getting physician into home to care for him to help with straightening out these issues? Could we check with our pharmacists to see if they could do home visit? I know they will sometimes help with this. He needs to be seeing a physician on a regular basis though.   OK for the nursing orders.

## 2020-02-29 NOTE — Telephone Encounter (Signed)
All sounds good. Thanks!

## 2020-02-29 NOTE — Telephone Encounter (Signed)
I left a detailed message on Richard [name]'s voicemail with the information below.  Message forwarded to pharmacist for review.

## 2020-02-29 NOTE — Telephone Encounter (Signed)
Richard [name] with [facility] was returning your call.  She would like to give you a Education officer, museum update from visit on 02/28/2020.

## 2020-02-29 NOTE — Progress Notes (Signed)
  Chronic Care Management   Outreach Note  02/29/2020 Name: Richard [name] MRN: [id number] DOB: [birth year]  Referred by: [name], MD Reason for referral : No chief complaint on file.   An unsuccessful telephone outreach was attempted today. The patient was referred to the pharmacist for assistance with care management and care coordination.   Follow Up Plan:   Prathima Ghanta [facility] Scheduler

## 2020-03-01 DIAGNOSIS — M5136 Other intervertebral disc degeneration, lumbar region: Secondary | ICD-10-CM | POA: Diagnosis not present

## 2020-03-01 DIAGNOSIS — K219 Gastro-esophageal reflux disease without esophagitis: Secondary | ICD-10-CM | POA: Diagnosis not present

## 2020-03-01 DIAGNOSIS — I69351 Hemiplegia and hemiparesis following cerebral infarction affecting right dominant side: Secondary | ICD-10-CM | POA: Diagnosis not present

## 2020-03-01 DIAGNOSIS — M1991 Primary osteoarthritis, unspecified site: Secondary | ICD-10-CM | POA: Diagnosis not present

## 2020-03-01 DIAGNOSIS — N185 Chronic kidney disease, stage 5: Secondary | ICD-10-CM | POA: Diagnosis not present

## 2020-03-01 DIAGNOSIS — I1311 Hypertensive heart and chronic kidney disease without heart failure, with stage 5 chronic kidney disease, or end stage renal disease: Secondary | ICD-10-CM | POA: Diagnosis not present

## 2020-03-01 DIAGNOSIS — E1122 Type 2 diabetes mellitus with diabetic chronic kidney disease: Secondary | ICD-10-CM | POA: Diagnosis not present

## 2020-03-01 DIAGNOSIS — E785 Hyperlipidemia, unspecified: Secondary | ICD-10-CM | POA: Diagnosis not present

## 2020-03-01 DIAGNOSIS — E1142 Type 2 diabetes mellitus with diabetic polyneuropathy: Secondary | ICD-10-CM | POA: Diagnosis not present

## 2020-03-02 ENCOUNTER — Telehealth: Payer: Self-pay

## 2020-03-02 DIAGNOSIS — I69351 Hemiplegia and hemiparesis following cerebral infarction affecting right dominant side: Secondary | ICD-10-CM | POA: Diagnosis not present

## 2020-03-02 DIAGNOSIS — E1122 Type 2 diabetes mellitus with diabetic chronic kidney disease: Secondary | ICD-10-CM | POA: Diagnosis not present

## 2020-03-02 DIAGNOSIS — I1311 Hypertensive heart and chronic kidney disease without heart failure, with stage 5 chronic kidney disease, or end stage renal disease: Secondary | ICD-10-CM | POA: Diagnosis not present

## 2020-03-02 DIAGNOSIS — K219 Gastro-esophageal reflux disease without esophagitis: Secondary | ICD-10-CM | POA: Diagnosis not present

## 2020-03-02 DIAGNOSIS — E785 Hyperlipidemia, unspecified: Secondary | ICD-10-CM | POA: Diagnosis not present

## 2020-03-02 DIAGNOSIS — M1991 Primary osteoarthritis, unspecified site: Secondary | ICD-10-CM | POA: Diagnosis not present

## 2020-03-02 DIAGNOSIS — N185 Chronic kidney disease, stage 5: Secondary | ICD-10-CM | POA: Diagnosis not present

## 2020-03-02 DIAGNOSIS — E1142 Type 2 diabetes mellitus with diabetic polyneuropathy: Secondary | ICD-10-CM | POA: Diagnosis not present

## 2020-03-02 DIAGNOSIS — M5136 Other intervertebral disc degeneration, lumbar region: Secondary | ICD-10-CM | POA: Diagnosis not present

## 2020-03-02 NOTE — Telephone Encounter (Signed)
Rx faxed to [facility] at the number below.

## 2020-03-06 DIAGNOSIS — E1142 Type 2 diabetes mellitus with diabetic polyneuropathy: Secondary | ICD-10-CM | POA: Diagnosis not present

## 2020-03-06 DIAGNOSIS — N185 Chronic kidney disease, stage 5: Secondary | ICD-10-CM | POA: Diagnosis not present

## 2020-03-06 DIAGNOSIS — M1991 Primary osteoarthritis, unspecified site: Secondary | ICD-10-CM | POA: Diagnosis not present

## 2020-03-06 DIAGNOSIS — E785 Hyperlipidemia, unspecified: Secondary | ICD-10-CM | POA: Diagnosis not present

## 2020-03-06 DIAGNOSIS — I1311 Hypertensive heart and chronic kidney disease without heart failure, with stage 5 chronic kidney disease, or end stage renal disease: Secondary | ICD-10-CM | POA: Diagnosis not present

## 2020-03-06 DIAGNOSIS — E1122 Type 2 diabetes mellitus with diabetic chronic kidney disease: Secondary | ICD-10-CM | POA: Diagnosis not present

## 2020-03-06 DIAGNOSIS — I69351 Hemiplegia and hemiparesis following cerebral infarction affecting right dominant side: Secondary | ICD-10-CM | POA: Diagnosis not present

## 2020-03-06 DIAGNOSIS — K219 Gastro-esophageal reflux disease without esophagitis: Secondary | ICD-10-CM | POA: Diagnosis not present

## 2020-03-06 DIAGNOSIS — M5136 Other intervertebral disc degeneration, lumbar region: Secondary | ICD-10-CM | POA: Diagnosis not present

## 2020-03-07 ENCOUNTER — Other Ambulatory Visit: Payer: Self-pay | Admitting: Family Medicine

## 2020-03-07 DIAGNOSIS — M1991 Primary osteoarthritis, unspecified site: Secondary | ICD-10-CM | POA: Diagnosis not present

## 2020-03-07 DIAGNOSIS — M5136 Other intervertebral disc degeneration, lumbar region: Secondary | ICD-10-CM | POA: Diagnosis not present

## 2020-03-07 DIAGNOSIS — K219 Gastro-esophageal reflux disease without esophagitis: Secondary | ICD-10-CM

## 2020-03-07 DIAGNOSIS — E785 Hyperlipidemia, unspecified: Secondary | ICD-10-CM | POA: Diagnosis not present

## 2020-03-07 DIAGNOSIS — N185 Chronic kidney disease, stage 5: Secondary | ICD-10-CM

## 2020-03-07 DIAGNOSIS — I10 Essential (primary) hypertension: Secondary | ICD-10-CM

## 2020-03-07 DIAGNOSIS — E1142 Type 2 diabetes mellitus with diabetic polyneuropathy: Secondary | ICD-10-CM | POA: Diagnosis not present

## 2020-03-07 DIAGNOSIS — E1122 Type 2 diabetes mellitus with diabetic chronic kidney disease: Secondary | ICD-10-CM | POA: Diagnosis not present

## 2020-03-07 DIAGNOSIS — I639 Cerebral infarction, unspecified: Secondary | ICD-10-CM

## 2020-03-07 DIAGNOSIS — N400 Enlarged prostate without lower urinary tract symptoms: Secondary | ICD-10-CM

## 2020-03-07 DIAGNOSIS — I1311 Hypertensive heart and chronic kidney disease without heart failure, with stage 5 chronic kidney disease, or end stage renal disease: Secondary | ICD-10-CM | POA: Diagnosis not present

## 2020-03-07 DIAGNOSIS — I69351 Hemiplegia and hemiparesis following cerebral infarction affecting right dominant side: Secondary | ICD-10-CM | POA: Diagnosis not present

## 2020-03-07 MED ORDER — TAMSULOSIN HCL 0.4 MG PO CAPS: 0.4000 mg | ORAL_CAPSULE | Freq: Every day | ORAL | 0 refills | Status: DC

## 2020-03-07 MED ORDER — FAMOTIDINE 20 MG PO TABS: 20.0000 mg | ORAL_TABLET | Freq: Two times a day (BID) | ORAL | 0 refills | Status: DC

## 2020-03-07 MED ORDER — CLOPIDOGREL BISULFATE 75 MG PO TABS: 75.0000 mg | ORAL_TABLET | Freq: Every day | ORAL | 0 refills | Status: DC

## 2020-03-07 MED ORDER — CALCITRIOL 0.25 MCG PO CAPS: 0.2500 ug | ORAL_CAPSULE | Freq: Every day | ORAL | 0 refills | Status: DC

## 2020-03-07 MED ORDER — VITAMIN D 50 MCG (2000 UT) PO TABS: 2000.0000 [IU] | ORAL_TABLET | Freq: Every morning | ORAL | 0 refills | Status: DC

## 2020-03-07 MED ORDER — PRAVASTATIN SODIUM 80 MG PO TABS: 80.0000 mg | ORAL_TABLET | Freq: Every day | ORAL | 0 refills | Status: DC

## 2020-03-07 MED ORDER — AMLODIPINE BESYLATE 10 MG PO TABS: 10.0000 mg | ORAL_TABLET | Freq: Every day | ORAL | 0 refills | Status: DC

## 2020-03-07 MED ORDER — HYDRALAZINE HCL 25 MG PO TABS: 25.0000 mg | ORAL_TABLET | Freq: Three times a day (TID) | ORAL | 0 refills | Status: DC

## 2020-03-07 MED ORDER — CHLORTHALIDONE 25 MG PO TABS: 25.0000 mg | ORAL_TABLET | Freq: Every day | ORAL | 0 refills | Status: DC

## 2020-03-07 MED ORDER — LEVETIRACETAM 500 MG PO TABS: 500.0000 mg | ORAL_TABLET | Freq: Two times a day (BID) | ORAL | 0 refills | Status: DC

## 2020-03-07 MED ORDER — CLONIDINE HCL 0.2 MG PO TABS: 0.2000 mg | ORAL_TABLET | Freq: Two times a day (BID) | ORAL | 0 refills | Status: DC

## 2020-03-07 MED ORDER — FUROSEMIDE 40 MG PO TABS: 40.0000 mg | ORAL_TABLET | Freq: Every day | ORAL | 0 refills | Status: DC

## 2020-03-07 NOTE — Telephone Encounter (Signed)
Please advise 

## 2020-03-07 NOTE — Telephone Encounter (Signed)
Also- I refilled all active medications from discharge list. Our active med list in system looks like it should be the same as what he should be taking. I did not refill insulins (ok to refill if needed). Please check bp at home and report back. If he has been off meds he really needs close monitoring. I'm not sure if they were able to look into visiting physician, but that would be helpful for him. Also, if they are able to draw blood let me know as he will need follow up labs completed.

## 2020-03-07 NOTE — Telephone Encounter (Signed)
Cara from [facility] stated she went over the medication list she has with the pt and there are several that need to be refilled and hasnt taken due to needing refills. She also stated that his bp has been running high because he hasn't taken the bp medication since he is out. Moshe Salisbury also needs to know exactly what medications he needs to be on. He never made a neurology follow up appt after being in the hospital with seizures and weakness. They are trying to get him over to [facility] for PT but a PT worker will be heading out to him for a note to send to insurance in order to get him started for that.   He is being left alone frequently even though he is depend in some AVL  Medication Refill:  Hydralazine Keppra Pravastatin Calcitrol Vitamin D Samotivine  Oxycodone (PMV on Thurs) Narcan   Moshe Salisbury can be reached at [phone] -ok to leave a detailed message per Moshe Salisbury

## 2020-03-07 NOTE — Telephone Encounter (Signed)
He has had appointments but has no showed for myself, vascular, neuro unfortunately. This makes it hard for me to know or advise what he needs to be taking because I'm missing exam and specialty input.   I declined to take over his pain medication prescribing (he asked for this stating pain doc wouldn't write for medication any longer but I'm not sure why)  He needs to be seeing nephrology for the renal failure and should be getting the calcitriol and vitamin D managed through them.   He also needs to be seeing neurology regarding the keppra.   He has not followed up with me for in office visit and has never in the past been checking his own bp at home, so her info regarding what blood pressures are will be helpful. I can refill his medications as they were on discharge (the hydralazine, keppra, pravastatin, (??suspect that is supposed to say famotidine and not samotivine??) and calcitriol/vitamin D. He will need to followup with neurology and nephrology for further refills of the keppra and calcitriol/vitamin D.   If you can give her the numbers for specialist to help with rescheduling that would be helpful.

## 2020-03-08 NOTE — Telephone Encounter (Signed)
Spoke with Richard [name] and reviewed Dr Berenice Bouton recommendations.  Richard [name] stated that the they were waiting on the PT for [place] so the patient can be placed.  She believes that this was completed yesterday.

## 2020-03-09 ENCOUNTER — Telehealth: Payer: Self-pay | Admitting: Family Medicine

## 2020-03-09 NOTE — Telephone Encounter (Signed)
FYI

## 2020-03-09 NOTE — Telephone Encounter (Signed)
Patient missed a home health visit today.

## 2020-03-14 NOTE — Telephone Encounter (Signed)
Oh, I do apologize Ms Richard [name], I thought I already responded to this. My scheduler and I tried to reach Mr. Richard [name] 2 weeks ago to schedule an appointment on how we can help him out. No one responded after 2 attempts, so we left a VM. Dr. Ethlyn Gallery is aware of it. Thank you!

## 2020-03-14 NOTE — Telephone Encounter (Signed)
Noted  

## 2020-03-16 ENCOUNTER — Telehealth: Payer: Self-pay | Admitting: Family Medicine

## 2020-03-16 NOTE — Telephone Encounter (Signed)
No answer at the pts cell number.  Spoke with the pts daughter [name], informed her of the message below and she stated she will be getting off of work soon and will go to check on the pt.  I offered to give her Rick's number and she stated she will call back.  Message sent to PCP.

## 2020-03-16 NOTE — Telephone Encounter (Signed)
After I see him on Monday, we can forward note to [place]. I will discuss with patient at that visit need for follow up and concerns that he has with follow through to that end.

## 2020-03-16 NOTE — Telephone Encounter (Signed)
I tried to call patient just now; no answer. I did leave message to call back.   Could you try again and see if you can get a hold of him? If you cannot, he has one other number (daughter) listed. Please try her. He has a lot of medical issues that need to be managed, so it is good for [place] to be in touch. Not sure if we have any other contact numbers. He did have a girlfriend who was at ER when he was there last?  [name], MD

## 2020-03-16 NOTE — Telephone Encounter (Signed)
Patient called back and was informed of the message below.  Patient stated he does not have air conditioning at his home, his phone is off and he cannot do anything now due to this.  Appt scheduled for 6/28 and the pt was advised to arrive at 9:45am.  Message sent to PCP.

## 2020-03-16 NOTE — Telephone Encounter (Signed)
Niel Hummer with [facility] is calling in stating that they have been trying to reach the patient last week and this week and has not been able to get anyone to answer or return their calls.  Pt has missed several appointments with them.

## 2020-03-19 ENCOUNTER — Ambulatory Visit: Payer: Medicare HMO | Admitting: Family Medicine

## 2020-03-19 DIAGNOSIS — I6930 Unspecified sequelae of cerebral infarction: Secondary | ICD-10-CM | POA: Insufficient documentation

## 2020-03-19 NOTE — Telephone Encounter (Signed)
Noted  

## 2020-03-19 NOTE — Progress Notes (Signed)
No show

## 2020-03-27 ENCOUNTER — Emergency Department (HOSPITAL_COMMUNITY): Payer: Medicare HMO

## 2020-03-27 ENCOUNTER — Encounter (HOSPITAL_COMMUNITY): Payer: Self-pay | Admitting: Emergency Medicine

## 2020-03-27 ENCOUNTER — Emergency Department (HOSPITAL_COMMUNITY)
Admission: EM | Admit: 2020-03-27 | Discharge: 2020-03-27 | Disposition: A | Discharge status: Home / Self Care | Payer: Medicare HMO | Attending: Emergency Medicine | Admitting: Emergency Medicine

## 2020-03-27 ENCOUNTER — Other Ambulatory Visit: Payer: Self-pay

## 2020-03-27 DIAGNOSIS — N185 Chronic kidney disease, stage 5: Secondary | ICD-10-CM | POA: Insufficient documentation

## 2020-03-27 DIAGNOSIS — I120 Hypertensive chronic kidney disease with stage 5 chronic kidney disease or end stage renal disease: Secondary | ICD-10-CM | POA: Insufficient documentation

## 2020-03-27 DIAGNOSIS — F17210 Nicotine dependence, cigarettes, uncomplicated: Secondary | ICD-10-CM | POA: Diagnosis not present

## 2020-03-27 DIAGNOSIS — S0990XA Unspecified injury of head, initial encounter: Secondary | ICD-10-CM | POA: Diagnosis not present

## 2020-03-27 DIAGNOSIS — S0081XA Abrasion of other part of head, initial encounter: Secondary | ICD-10-CM | POA: Insufficient documentation

## 2020-03-27 DIAGNOSIS — E1122 Type 2 diabetes mellitus with diabetic chronic kidney disease: Secondary | ICD-10-CM | POA: Insufficient documentation

## 2020-03-27 DIAGNOSIS — Y939 Activity, unspecified: Secondary | ICD-10-CM | POA: Insufficient documentation

## 2020-03-27 DIAGNOSIS — Y999 Unspecified external cause status: Secondary | ICD-10-CM | POA: Insufficient documentation

## 2020-03-27 DIAGNOSIS — Z79899 Other long term (current) drug therapy: Secondary | ICD-10-CM | POA: Diagnosis not present

## 2020-03-27 DIAGNOSIS — Y92480 Sidewalk as the place of occurrence of the external cause: Secondary | ICD-10-CM | POA: Diagnosis not present

## 2020-03-27 DIAGNOSIS — M545 Low back pain: Secondary | ICD-10-CM | POA: Diagnosis not present

## 2020-03-27 DIAGNOSIS — E041 Nontoxic single thyroid nodule: Secondary | ICD-10-CM | POA: Diagnosis not present

## 2020-03-27 DIAGNOSIS — W010XXA Fall on same level from slipping, tripping and stumbling without subsequent striking against object, initial encounter: Secondary | ICD-10-CM | POA: Insufficient documentation

## 2020-03-27 DIAGNOSIS — S299XXA Unspecified injury of thorax, initial encounter: Secondary | ICD-10-CM | POA: Diagnosis not present

## 2020-03-27 DIAGNOSIS — Z794 Long term (current) use of insulin: Secondary | ICD-10-CM | POA: Insufficient documentation

## 2020-03-27 DIAGNOSIS — S79912A Unspecified injury of left hip, initial encounter: Secondary | ICD-10-CM | POA: Diagnosis not present

## 2020-03-27 DIAGNOSIS — W19XXXA Unspecified fall, initial encounter: Secondary | ICD-10-CM

## 2020-03-27 DIAGNOSIS — S3992XA Unspecified injury of lower back, initial encounter: Secondary | ICD-10-CM | POA: Diagnosis not present

## 2020-03-27 DIAGNOSIS — L299 Pruritus, unspecified: Secondary | ICD-10-CM | POA: Diagnosis not present

## 2020-03-27 DIAGNOSIS — M546 Pain in thoracic spine: Secondary | ICD-10-CM | POA: Diagnosis not present

## 2020-03-27 DIAGNOSIS — S199XXA Unspecified injury of neck, initial encounter: Secondary | ICD-10-CM | POA: Diagnosis not present

## 2020-03-27 MED ORDER — HYDROXYZINE HCL 25 MG PO TABS
25.0000 mg | ORAL_TABLET | Freq: Three times a day (TID) | ORAL | 0 refills | Status: DC | PRN
Start: 2020-03-27 — End: 2020-05-07

## 2020-03-27 MED ORDER — CLONIDINE HCL 0.1 MG PO TABS
0.2000 mg | ORAL_TABLET | Freq: Once | ORAL | Status: AC
  Administered 2020-03-27: 0.2 mg via ORAL
  Filled 2020-03-27: qty 2

## 2020-03-27 MED ORDER — HYDRALAZINE HCL 25 MG PO TABS
25.0000 mg | ORAL_TABLET | Freq: Once | ORAL | Status: AC
  Administered 2020-03-27: 25 mg via ORAL
  Filled 2020-03-27: qty 1

## 2020-03-27 MED ORDER — HYDROXYZINE HCL 10 MG PO TABS
10.0000 mg | ORAL_TABLET | Freq: Once | ORAL | Status: AC
  Administered 2020-03-27: 10 mg via ORAL
  Filled 2020-03-27: qty 1

## 2020-03-27 NOTE — Discharge Instructions (Signed)
Your work-up today was overall reassuring.  The CT imaging did show a thyroid nodule which you need to follow-up with your primary doctor for.  Your blood pressure was elevated but you missed your medications.  We gave the medicines and it started to improve.  Please follow-up with your primary doctor for further management.  Please use the Atarax to help with the itching.  Please follow-up with your PCP for the rash.  If any symptoms change or worsen, please return to the nearest emergency department.

## 2020-03-27 NOTE — ED Provider Notes (Signed)
[facility] DEPT Provider Note   CSN: [id number] Arrival date & time: 03/27/20  1740     History Chief Complaint  Patient presents with  . Fall    [name] is a 64 y.o. male.  The history is provided by the patient and medical records. No language interpreter was used.  Fall This is a new problem. The current episode started 3 to 5 hours ago. The problem occurs constantly. The problem has not changed since onset.Associated symptoms include headaches. Pertinent negatives include no chest pain, no abdominal pain and no shortness of breath. Nothing aggravates the symptoms. Nothing relieves the symptoms. He has tried nothing for the symptoms. The treatment provided no relief.       Past Medical History:  Diagnosis Date  . Arthritis   . Diabetes mellitus, type II, insulin dependent ([place])   . GERD (gastroesophageal reflux disease)   . Hypertension   . Hypertensive emergency 05/21/2012  . Migraines   . Neuropathy in diabetes ([place])    bilat feet  . Ruptured lumbar disc   . Stroke [facility]) 2013   residual right sided weakness (arm>leg)    Patient Active Problem List   Diagnosis Date Noted  . History of cerebrovascular accident (CVA) with residual deficit 03/19/2020  . CKD (chronic kidney disease) stage 5, GFR less than 15 ml/min (HCC) 02/15/2020  . Neuropathy in diabetes ([place])   . Migraines   . Hypertension   . GERD (gastroesophageal reflux disease)   . Diabetes mellitus, type II, insulin dependent ([place])   . Arthritis   . Hyperlipidemia 08/25/2012  . Chronic kidney disease, stage 4 (severe) ([place]) 08/23/2012  . Tobacco abuse 08/23/2012  . Protein-calorie malnutrition, mild ([place]) 08/23/2012  . 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 KNEE ARTHROSCOPY Bilateral        Family History  Problem Relation Age of Onset  . Other Mother   . Stroke  Father   . Hypertension Father   . Epilepsy Father   . Breast cancer Sister   . Stroke Brother   . Heart attack Brother   . Diabetes Sister     Social History   Tobacco Use  . Smoking status: Current Every Day Smoker    Packs/day: 0.50    Types: Cigarettes  . Smokeless tobacco: Never Used  Vaping Use  . Vaping Use: Never used  Substance Use Topics  . Alcohol use: No  . Drug use: Yes    Types: Marijuana    Home Medications Prior to Admission medications   Medication Sig Start Date End Date Taking? Authorizing Provider  ACCU-CHEK AVIVA PLUS test strip 1 each by Other route See admin instructions. Use 1 test strip to test blood sugar two-three times daily 04/20/18   [provider]  ACCU-CHEK AVIVA PLUS test strip USE 1 strip TO test blood sugar 2 TIMES DAILY TO 3 TIMES DAILY Patient taking differently: 1 each by Other route in the morning, at noon, and at bedtime.  06/10/19   Koberlein, [name], MD  amLODipine (NORVASC) 10 MG tablet Take 1 tablet (10 mg total) by mouth daily. 03/07/20   [name], MD  blood glucose meter kit and supplies KIT Dispense based on patient and insurance preference. Check blood sugar 4 times a day Patient taking differently: Inject 1 each into the skin See admin instructions. Dispense based on patient and  insurance preference. Check blood sugar 4 times a day 06/11/18   [name], MD  calcitRIOL (ROCALTROL) 0.25 MCG capsule Take 1 capsule (0.25 mcg total) by mouth daily. 03/07/20   [name], MD  chlorhexidine (PERIDEX) 0.12 % solution Use as directed 15 mLs in the mouth or throat See admin instructions. Swish with 53ms for 30 seconds then spit. May use two times daily. 06/01/18   [provider]  chlorthalidone (HYGROTON) 25 MG tablet Take 1 tablet (25 mg total) by mouth daily. 03/07/20   KCaren Macadam MD  Cholecalciferol (VITAMIN D) 50 MCG (2000 UT) tablet Take 1 tablet (2,000 Units total) by mouth in the  morning. 03/07/20   KCaren Macadam MD  cloNIDine (CATAPRES) 0.2 MG tablet Take 1 tablet (0.2 mg total) by mouth 2 (two) times daily. 03/07/20   KCaren Macadam MD  clopidogrel (PLAVIX) 75 MG tablet Take 1 tablet (75 mg total) by mouth daily. 03/07/20   KCaren Macadam MD  famotidine (PEPCID) 20 MG tablet Take 1 tablet (20 mg total) by mouth 2 (two) times daily. 03/07/20   KCaren Macadam MD  furosemide (LASIX) 40 MG tablet Take 1 tablet (40 mg total) by mouth daily. 03/07/20   KCaren Macadam MD  hydrALAZINE (APRESOLINE) 25 MG tablet Take 1 tablet (25 mg total) by mouth every 8 (eight) hours. 03/07/20   Koberlein, JSteele Berg MD  insulin aspart protamine - aspart (NOVOLOG MIX 70/30 FLEXPEN) (70-30) 100 UNIT/ML FlexPen INJECT 28 UNITS INTO THE SKIN EVERY MORNING Patient taking differently: Inject 28 Units into the skin in the morning.  12/01/19   Koberlein, JSteele Berg MD  Insulin Pen Needle (SURE COMFORT PEN NEEDLES) 31G X 8 MM MISC Use as directed twice daily with Novolog flex pen Patient taking differently: 1 each by Other route See admin instructions. Use as directed twice daily with Novolog flex pen 02/10/19   Koberlein, JSteele Berg MD  levETIRAcetam (KEPPRA) 500 MG tablet Take 1 tablet (500 mg total) by mouth 2 (two) times daily. 03/07/20 04/06/20  KCaren Macadam MD  Misc. Devices (COMMODE 3-IN-1) MISC Use as directed 02/24/20   KCaren Macadam MD  Misc. Devices (TRANSFER BENCH) MISC Use as directed 02/24/20   Koberlein, JSteele Berg MD  NARCAN 4 MG/0.1ML LIQD nasal spray kit Place 0.4 mg into the nose once.  05/31/18   [provider]  Oxycodone HCl 20 MG TABS Take 20 mg by mouth every 6 (six) hours as needed (pain).  05/31/18   [provider]  pantoprazole (PROTONIX) 40 MG tablet TAKE 1 TABLET BY MOUTH EVERY DAY Patient taking differently: Take 40 mg by mouth daily.  01/23/20   Koberlein, JSteele Berg MD  potassium chloride SA (KLOR-CON) 20 MEQ tablet Take 20 mEq by  mouth daily.  12/07/19   [provider]  pravastatin (PRAVACHOL) 80 MG tablet Take 1 tablet (80 mg total) by mouth at bedtime. 03/07/20   KCaren Macadam MD  tamsulosin (FLOMAX) 0.4 MG CAPS capsule Take 1 capsule (0.4 mg total) by mouth daily. 03/07/20   KCaren Macadam MD  TRUEPLUS INSULIN SYRINGE 31G X 5/16" 1 ML MISC Use pen needle with insulin 2 times daily Patient taking differently: 1 each by Other route in the morning and at bedtime. With insulin 03/31/19   KCaren Macadam MD    Allergies    Patient has no known allergies.  Review of Systems   Review of Systems  Constitutional: Negative for chills, diaphoresis, fatigue and fever.  HENT: Negative for congestion.   Eyes: Negative for pain and visual disturbance.  Respiratory: Negative for cough, chest tightness, shortness of breath and wheezing.   Cardiovascular: Negative for chest pain, palpitations and leg swelling.  Gastrointestinal: Positive for nausea and vomiting (resolved). Negative for abdominal pain, constipation and diarrhea.  Genitourinary: Negative for dysuria, flank pain and frequency.  Musculoskeletal: Positive for back pain and neck pain. Negative for neck stiffness.  Skin: Positive for wound. Negative for rash.  Neurological: Positive for weakness (at baseline) and headaches. Negative for dizziness, facial asymmetry, speech difficulty, light-headedness and numbness.  Psychiatric/Behavioral: Negative for agitation.  All other systems reviewed and are negative.   Physical Exam Updated Vital Signs BP (!) 185/99 (BP Location: Left Arm)   Pulse 86   Temp 98.8 F (37.1 C) (Oral)   Resp 16   SpO2 100%   Physical Exam Vitals and nursing note reviewed.  Constitutional:      General: He is not in acute distress.    Appearance: He is well-developed. He is not ill-appearing, toxic-appearing or diaphoretic.  HENT:     Head: Normocephalic and atraumatic.      Nose: Nose normal. No congestion or  rhinorrhea.     Mouth/Throat:     Mouth: Mucous membranes are moist.     Pharynx: No oropharyngeal exudate or posterior oropharyngeal erythema.  Eyes:     General: Lids are normal.     Extraocular Movements: Extraocular movements intact.     Right eye: Normal extraocular motion and no nystagmus.     Left eye: Normal extraocular motion and no nystagmus.     Conjunctiva/sclera: Conjunctivae normal.     Pupils: Pupils are equal, round, and reactive to light.  Neck:   Cardiovascular:     Rate and Rhythm: Normal rate and regular rhythm.     Pulses: Normal pulses.     Heart sounds: No murmur heard.   Pulmonary:     Effort: Pulmonary effort is normal. No respiratory distress.     Breath sounds: Normal breath sounds. No wheezing, rhonchi or rales.  Chest:     Chest wall: No tenderness.  Abdominal:     General: Abdomen is flat.     Palpations: Abdomen is soft.     Tenderness: There is no abdominal tenderness. There is no right CVA tenderness, left CVA tenderness, guarding or rebound.  Musculoskeletal:        General: Tenderness present.     Cervical back: Neck supple. Signs of trauma (fall), spasms and tenderness present. Muscular tenderness present.     Thoracic back: Spasms and tenderness present.     Lumbar back: Spasms and tenderness present.       Back:     Left hip: Tenderness present. No deformity or lacerations.     Right lower leg: No edema.     Left lower leg: No edema.       Legs:  Skin:    General: Skin is warm and dry.     Capillary Refill: Capillary refill takes less than 2 seconds.     Findings: No erythema.  Neurological:     Mental Status: He is alert. Mental status is at baseline.     GCS: GCS eye subscore is 4. GCS verbal subscore is 5. GCS motor subscore is 6.     Cranial Nerves: No dysarthria or facial asymmetry.     Sensory: No sensory deficit.  Motor: Weakness and abnormal muscle tone present. No tremor.     Comments: Weakness in right arm and right  leg which is unchanged from his baseline by report.  Sensation also unchanged from baseline he reports.   Psychiatric:        Mood and Affect: Mood normal.     ED Results / Procedures / Treatments   Labs (all labs ordered are listed, but only abnormal results are displayed) Labs Reviewed - No data to display  EKG None  Radiology CT Head Wo Contrast  Result Date: 03/27/2020 CLINICAL DATA:  64 year old male with head trauma. EXAM: CT HEAD WITHOUT CONTRAST TECHNIQUE: Contiguous axial images were obtained from the base of the skull through the vertex without intravenous contrast. COMPARISON:  Head CT dated 02/13/2020. FINDINGS: Brain: There is mild age-related atrophy and moderate chronic microvascular ischemic changes. There is no acute intracranial hemorrhage. No mass effect or midline shift. No extra-axial fluid collection. Stable prominence of the left frontal extra-axial space. Vascular: No hyperdense vessel or unexpected calcification. Skull: Normal. Negative for fracture or focal lesion. Sinuses/Orbits: Mild diffuse mucoperiosteal thickening of paranasal sinuses. No air-fluid level. The mastoid air cells are clear. Other: None IMPRESSION: 1. No acute intracranial pathology. 2. Age-related atrophy and chronic microvascular ischemic changes. Electronically Signed   By: Anner Crete M.D.   On: 03/27/2020 19:14   CT Cervical Spine Wo Contrast  Addendum Date: 03/27/2020   ADDENDUM REPORT: 03/27/2020 21:10 ADDENDUM: The suspected part solid nodule is actually imaged in its entirety on the CT of the thoracic spine which was acquired concurrently but reported separately. It is quite similar based on comparison with 2012 and is therefore likely to be scarring. There is some other scattered areas of ground-glass for which follow-up could be considered many of these also likely present though the chest is incompletely imaged on today's exam. This finding in the thyroid nodule were discussed with the  referring provider as outlined below. These results were called by telephone at the time of interpretation on 03/27/2020 at 9:10 pm to provider [facility] , who verbally acknowledged these results. Electronically Signed   By: Zetta Bills M.D.   On: 03/27/2020 21:10   Result Date: 03/27/2020 CLINICAL DATA:  Back trauma EXAM: CT CERVICAL SPINE WITHOUT CONTRAST TECHNIQUE: Multidetector CT imaging of the cervical spine was performed without intravenous contrast. Multiplanar CT image reconstructions were also generated. COMPARISON:  April 2011 FINDINGS: Alignment: Is mild reversal of normal cervical lordosis and straightening likely due to position and associated degenerative changes. Skull base and vertebrae: No acute fracture. No primary bone lesion or focal pathologic process. Soft tissues and spinal canal: Nodular thyroid gland with a nodule in the RIGHT hemi thyroid measuring 2 cm. Disc levels: Multilevel degenerative changes in the spine. Disc space narrowing greatest at C3-4. Small anterior osteophytes at nearly all visualized levels. Atlantoaxial degenerative changes as well. Upper chest: Spiculated appearing part solid nodule in the RIGHT upper lobe is suggested potentially measuring 14 x 13 mm (image 88, series 3) Other: Calcified atheromatous plaque of the carotid arteries bilaterally. IMPRESSION: 1. No acute fracture or malalignment of the cervical spine. 2. Multilevel degenerative changes worse than in 2011. 3. Spiculated appearing part solid nodule in the RIGHT upper lobe is suggested potentially measuring 14 x 13 mm. Further evaluation with dedicated CT of the chest is recommended. 4. Nodular thyroid gland with a nodule in the RIGHT hemi thyroid measuring 2 cm. Recommend thyroid US (ref: J Am Coll  Radiol. 2015 Feb;12(2): 143-50). 5. Calcified atheromatous plaque of the carotid arteries bilaterally. Electronically Signed: By: Zetta Bills M.D. On: 03/27/2020 20:59   CT Thoracic Spine Wo  Contrast  Result Date: 03/27/2020 CLINICAL DATA:  [place] with subsequent back pain EXAM: CT THORACIC AND LUMBAR SPINE WITHOUT CONTRAST TECHNIQUE: Multidetector CT imaging of the thoracic and lumbar spine was performed without contrast. Multiplanar CT image reconstructions were also generated. COMPARISON:  None. FINDINGS: CT THORACIC SPINE FINDINGS Alignment: Normal Vertebrae: Normal Paraspinal and other soft tissues: Normal except for small areas of pulmonary scarring. Disc levels: No significant disc space pathology. No evidence of soft disc herniation. No endplate or facet osteophytes. No canal or foraminal stenosis. CT LUMBAR SPINE FINDINGS Segmentation: 5 lumbar type vertebral bodies. Alignment: Normal Vertebrae: No fracture or focal bone lesion. Paraspinal and other soft tissues: Aortic atherosclerosis. Otherwise negative. Disc levels: No significant degenerative changes. No disc herniation. No canal or foraminal stenosis. Ordinary mild facet osteoarthritis in the lumbar region without slippage or advanced disease. IMPRESSION: CT THORACIC SPINE IMPRESSION No traumatic finding.  Normal thoracic CT. CT LUMBAR SPINE IMPRESSION No traumatic finding. Ordinary mild lumbar facet osteoarthritis. No apparent stenosis or neural compression. Electronically Signed   By: Nelson Chimes M.D.   On: 03/27/2020 20:54   CT Lumbar Spine Wo Contrast  Result Date: 03/27/2020 CLINICAL DATA:  [place] with subsequent back pain EXAM: CT THORACIC AND LUMBAR SPINE WITHOUT CONTRAST TECHNIQUE: Multidetector CT imaging of the thoracic and lumbar spine was performed without contrast. Multiplanar CT image reconstructions were also generated. COMPARISON:  None. FINDINGS: CT THORACIC SPINE FINDINGS Alignment: Normal Vertebrae: Normal Paraspinal and other soft tissues: Normal except for small areas of pulmonary scarring. Disc levels: No significant disc space pathology. No evidence of soft disc herniation. No endplate or facet osteophytes. No canal  or foraminal stenosis. CT LUMBAR SPINE FINDINGS Segmentation: 5 lumbar type vertebral bodies. Alignment: Normal Vertebrae: No fracture or focal bone lesion. Paraspinal and other soft tissues: Aortic atherosclerosis. Otherwise negative. Disc levels: No significant degenerative changes. No disc herniation. No canal or foraminal stenosis. Ordinary mild facet osteoarthritis in the lumbar region without slippage or advanced disease. IMPRESSION: CT THORACIC SPINE IMPRESSION No traumatic finding.  Normal thoracic CT. CT LUMBAR SPINE IMPRESSION No traumatic finding. Ordinary mild lumbar facet osteoarthritis. No apparent stenosis or neural compression. Electronically Signed   By: Nelson Chimes M.D.   On: 03/27/2020 20:54   DG Hip Unilat W or Wo Pelvis 2-3 Views Left  Result Date: 03/27/2020 CLINICAL DATA:  Fall EXAM: DG HIP (WITH OR WITHOUT PELVIS) 2-3V LEFT COMPARISON:  01/07/2016 FINDINGS: SI joints are non widened. Pubic symphysis and rami are intact. No fracture or malalignment. There are vascular calcifications. IMPRESSION: Negative. Electronically Signed   By: Donavan Foil M.D.   On: 03/27/2020 20:47    Procedures Procedures (including critical care time)  Medications Ordered in ED Medications  hydrALAZINE (APRESOLINE) tablet 25 mg (25 mg Oral Given 03/27/20 2223)  cloNIDine (CATAPRES) tablet 0.2 mg (0.2 mg Oral Given 03/27/20 2224)  hydrOXYzine (ATARAX/VISTARIL) tablet 10 mg (10 mg Oral Given 03/27/20 2223)    ED Course  I have reviewed the triage vital signs and the nursing notes.  Pertinent labs & imaging results that were available during my care of the patient were reviewed by me and considered in my medical decision making (see chart for details).    MDM Rules/Calculators/A&P  Richard [name] is a 64 y.o. male with a past medical history significant for prior stroke with right-sided weakness in the arm and leg, hypertension, hyperlipidemia, GERD, diabetes, CKD, and  migraines who presents with a fall.  Patient reports that he had a mechanical fall this afternoon where he slipped and got his legs tripped up causing him to fall hitting his head on the concrete sidewalk.  He reported nausea and vomiting after the fact but did not lose consciousness.  He reports his right-sided weakness is at his baseline but he is still having moderate headache.  He reports the nausea has improved.  He denies any vision changes including no diplopia.  No chest or abdominal pain however he does report that since waiting in the emergency department, he is developed pain in his neck, T-spine, and L-spine.  Also been pain in his left hip.  He reports that initially was more concern about his head but over time and now is having pain at the places.  He describes as moderate to severe.  He denies any loss of bowel or bladder control.  Denies any preceding symptoms such as fevers, chills, dressing, cough, or other complaints.  On exam, lungs are clear and chest is nontender.  Abdomen is nontender.  Left hip is tender to palpation but he did have reportedly unchanged sensation and strength in legs.  He is weak on the right arm and right leg compared to left which she reports is unchanged.  Good pulses in extremities.  Pupils are symmetric and reactive normal extraocular movements.  Symmetric smile.  Clear speech.  Patient was tender in the neck and entire back.  There was also paraspinal tenderness.  He also has a very small abrasion on his right anterior forehead that is hemostatic.  No crepitance or laceration present.  Mild tenderness.  Clinically I suspect he is having muscle spasms that developed after his fall and he has a concussion.  CT head was performed in triage and did not show any fracture or bleed.  He is on Plavix.  After assessment, he does want imaging of his neck and back.  Will get CT imaging given his history of L-spine injury in the chart.  We will also get x-ray of the left  hip.  If imaging is reassuring and he still has baseline, anticipate discharge home.         Patient's blood pressure was elevated on reassessment.  He reports he has not taken his blood pressure medicine this afternoon.  He was given his oral home blood pressure medicine and is started to downtrend below 200.  He is feeling well with no symptoms of hypertension.  Patient's imaging was reassuring but we did find evidence of a thyroid nodule.  Patient was informed of this and he will follow-up with his PCP for this.  We also redemonstrated some abnormality likely scar tissue in the right lung.  Patient was also informed of this.  Patient will follow up with PCP.  He will also be given a prescription for Atarax as he thinks this helped his itching today.  He reports a chronic rash that itches sometimes.  Based on the evaluation do not suspect that this is scabies and I suspect is more related to dry skin or irritation.  Patient will follow with PCP for this.  He no questions or concerns and was discharged in good condition.    Final Clinical Impression(s) / ED Diagnoses Final diagnoses:  Fall, initial  encounter  Pruritus  Injury of head, initial encounter  Thyroid nodule    Rx / DC Orders ED Discharge Orders         Ordered    hydrOXYzine (ATARAX/VISTARIL) 25 MG tablet  Every 8 hours PRN     Discontinue  Reprint     03/27/20 2317         Clinical Impression: 1. Fall, initial encounter   2. Pruritus   3. Injury of head, initial encounter   4. Thyroid nodule     Disposition: Admit  This note was prepared with assistance of Dragon voice recognition software. Occasional wrong-word or sound-a-like substitutions may have occurred due to the inherent limitations of voice recognition software.     Kalynne Womac, Gwenyth Allegra, MD 03/27/20 [phone]

## 2020-03-27 NOTE — ED Triage Notes (Signed)
Per pt, states he lost his balance and fell and hit his head on the concrete-states his is on blood thinners-denies pain

## 2020-03-28 ENCOUNTER — Other Ambulatory Visit: Payer: Self-pay | Admitting: Family Medicine

## 2020-03-28 DIAGNOSIS — L309 Dermatitis, unspecified: Secondary | ICD-10-CM

## 2020-03-28 DIAGNOSIS — I639 Cerebral infarction, unspecified: Secondary | ICD-10-CM

## 2020-03-28 DIAGNOSIS — N400 Enlarged prostate without lower urinary tract symptoms: Secondary | ICD-10-CM

## 2020-03-28 DIAGNOSIS — I10 Essential (primary) hypertension: Secondary | ICD-10-CM

## 2020-04-03 ENCOUNTER — Ambulatory Visit: Payer: Medicare HMO | Admitting: Podiatry

## 2020-04-10 ENCOUNTER — Emergency Department (HOSPITAL_COMMUNITY): Payer: Medicare HMO

## 2020-04-10 ENCOUNTER — Inpatient Hospital Stay (HOSPITAL_COMMUNITY)
Admission: EM | Admit: 2020-04-10 | Discharge: 2020-05-07 | DRG: 981 | Disposition: A | Discharge status: Skilled Nursing Facility | Payer: Medicare HMO | Attending: Internal Medicine | Admitting: Internal Medicine

## 2020-04-10 DIAGNOSIS — G40909 Epilepsy, unspecified, not intractable, without status epilepticus: Principal | ICD-10-CM

## 2020-04-10 DIAGNOSIS — E785 Hyperlipidemia, unspecified: Secondary | ICD-10-CM | POA: Diagnosis present

## 2020-04-10 DIAGNOSIS — E1140 Type 2 diabetes mellitus with diabetic neuropathy, unspecified: Secondary | ICD-10-CM | POA: Diagnosis present

## 2020-04-10 DIAGNOSIS — Z830 Family history of human immunodeficiency virus [HIV] disease: Secondary | ICD-10-CM

## 2020-04-10 DIAGNOSIS — N186 End stage renal disease: Secondary | ICD-10-CM | POA: Diagnosis present

## 2020-04-10 DIAGNOSIS — N185 Chronic kidney disease, stage 5: Secondary | ICD-10-CM

## 2020-04-10 DIAGNOSIS — B20 Human immunodeficiency virus [HIV] disease: Secondary | ICD-10-CM

## 2020-04-10 DIAGNOSIS — G92 Toxic encephalopathy: Secondary | ICD-10-CM | POA: Diagnosis not present

## 2020-04-10 DIAGNOSIS — R569 Unspecified convulsions: Secondary | ICD-10-CM

## 2020-04-10 DIAGNOSIS — G4089 Other seizures: Secondary | ICD-10-CM | POA: Diagnosis not present

## 2020-04-10 DIAGNOSIS — Z803 Family history of malignant neoplasm of breast: Secondary | ICD-10-CM | POA: Diagnosis not present

## 2020-04-10 DIAGNOSIS — Z515 Encounter for palliative care: Secondary | ICD-10-CM | POA: Diagnosis not present

## 2020-04-10 DIAGNOSIS — R404 Transient alteration of awareness: Secondary | ICD-10-CM | POA: Diagnosis not present

## 2020-04-10 DIAGNOSIS — Z720 Tobacco use: Secondary | ICD-10-CM | POA: Diagnosis not present

## 2020-04-10 DIAGNOSIS — I160 Hypertensive urgency: Secondary | ICD-10-CM | POA: Diagnosis not present

## 2020-04-10 DIAGNOSIS — E11319 Type 2 diabetes mellitus with unspecified diabetic retinopathy without macular edema: Secondary | ICD-10-CM | POA: Diagnosis present

## 2020-04-10 DIAGNOSIS — G9349 Other encephalopathy: Secondary | ICD-10-CM | POA: Diagnosis not present

## 2020-04-10 DIAGNOSIS — K219 Gastro-esophageal reflux disease without esophagitis: Secondary | ICD-10-CM | POA: Diagnosis present

## 2020-04-10 DIAGNOSIS — E11649 Type 2 diabetes mellitus with hypoglycemia without coma: Secondary | ICD-10-CM | POA: Diagnosis not present

## 2020-04-10 DIAGNOSIS — Z833 Family history of diabetes mellitus: Secondary | ICD-10-CM

## 2020-04-10 DIAGNOSIS — R531 Weakness: Secondary | ICD-10-CM | POA: Diagnosis not present

## 2020-04-10 DIAGNOSIS — I161 Hypertensive emergency: Secondary | ICD-10-CM | POA: Diagnosis present

## 2020-04-10 DIAGNOSIS — E872 Acidosis: Secondary | ICD-10-CM | POA: Diagnosis present

## 2020-04-10 DIAGNOSIS — N179 Acute kidney failure, unspecified: Secondary | ICD-10-CM | POA: Diagnosis not present

## 2020-04-10 DIAGNOSIS — E440 Moderate protein-calorie malnutrition: Secondary | ICD-10-CM | POA: Diagnosis present

## 2020-04-10 DIAGNOSIS — F17210 Nicotine dependence, cigarettes, uncomplicated: Secondary | ICD-10-CM | POA: Diagnosis present

## 2020-04-10 DIAGNOSIS — D631 Anemia in chronic kidney disease: Secondary | ICD-10-CM | POA: Diagnosis not present

## 2020-04-10 DIAGNOSIS — E1169 Type 2 diabetes mellitus with other specified complication: Secondary | ICD-10-CM

## 2020-04-10 DIAGNOSIS — R29818 Other symptoms and signs involving the nervous system: Secondary | ICD-10-CM | POA: Diagnosis not present

## 2020-04-10 DIAGNOSIS — N184 Chronic kidney disease, stage 4 (severe): Secondary | ICD-10-CM | POA: Diagnosis not present

## 2020-04-10 DIAGNOSIS — G43909 Migraine, unspecified, not intractable, without status migrainosus: Secondary | ICD-10-CM | POA: Diagnosis present

## 2020-04-10 DIAGNOSIS — G9340 Encephalopathy, unspecified: Secondary | ICD-10-CM

## 2020-04-10 DIAGNOSIS — R29810 Facial weakness: Secondary | ICD-10-CM | POA: Diagnosis not present

## 2020-04-10 DIAGNOSIS — Z7902 Long term (current) use of antithrombotics/antiplatelets: Secondary | ICD-10-CM

## 2020-04-10 DIAGNOSIS — Z7189 Other specified counseling: Secondary | ICD-10-CM | POA: Diagnosis not present

## 2020-04-10 DIAGNOSIS — E441 Mild protein-calorie malnutrition: Secondary | ICD-10-CM | POA: Diagnosis not present

## 2020-04-10 DIAGNOSIS — N2581 Secondary hyperparathyroidism of renal origin: Secondary | ICD-10-CM | POA: Diagnosis present

## 2020-04-10 DIAGNOSIS — R7989 Other specified abnormal findings of blood chemistry: Secondary | ICD-10-CM

## 2020-04-10 DIAGNOSIS — M1990 Unspecified osteoarthritis, unspecified site: Secondary | ICD-10-CM | POA: Diagnosis present

## 2020-04-10 DIAGNOSIS — I69351 Hemiplegia and hemiparesis following cerebral infarction affecting right dominant side: Secondary | ICD-10-CM

## 2020-04-10 DIAGNOSIS — Z79899 Other long term (current) drug therapy: Secondary | ICD-10-CM

## 2020-04-10 DIAGNOSIS — I120 Hypertensive chronic kidney disease with stage 5 chronic kidney disease or end stage renal disease: Secondary | ICD-10-CM | POA: Diagnosis not present

## 2020-04-10 DIAGNOSIS — R21 Rash and other nonspecific skin eruption: Secondary | ICD-10-CM | POA: Diagnosis not present

## 2020-04-10 DIAGNOSIS — I639 Cerebral infarction, unspecified: Secondary | ICD-10-CM | POA: Diagnosis not present

## 2020-04-10 DIAGNOSIS — I6389 Other cerebral infarction: Secondary | ICD-10-CM | POA: Diagnosis not present

## 2020-04-10 DIAGNOSIS — Z9114 Patient's other noncompliance with medication regimen: Secondary | ICD-10-CM

## 2020-04-10 DIAGNOSIS — Z794 Long term (current) use of insulin: Secondary | ICD-10-CM

## 2020-04-10 DIAGNOSIS — Z8249 Family history of ischemic heart disease and other diseases of the circulatory system: Secondary | ICD-10-CM

## 2020-04-10 DIAGNOSIS — E119 Type 2 diabetes mellitus without complications: Secondary | ICD-10-CM | POA: Diagnosis not present

## 2020-04-10 DIAGNOSIS — Z820 Family history of epilepsy and other diseases of the nervous system: Secondary | ICD-10-CM | POA: Diagnosis not present

## 2020-04-10 DIAGNOSIS — I6930 Unspecified sequelae of cerebral infarction: Secondary | ICD-10-CM | POA: Diagnosis not present

## 2020-04-10 DIAGNOSIS — Z7401 Bed confinement status: Secondary | ICD-10-CM | POA: Diagnosis not present

## 2020-04-10 DIAGNOSIS — R54 Age-related physical debility: Secondary | ICD-10-CM | POA: Diagnosis present

## 2020-04-10 DIAGNOSIS — G9341 Metabolic encephalopathy: Secondary | ICD-10-CM | POA: Diagnosis not present

## 2020-04-10 DIAGNOSIS — E1122 Type 2 diabetes mellitus with diabetic chronic kidney disease: Secondary | ICD-10-CM | POA: Diagnosis not present

## 2020-04-10 DIAGNOSIS — I959 Hypotension, unspecified: Secondary | ICD-10-CM | POA: Diagnosis not present

## 2020-04-10 DIAGNOSIS — R0902 Hypoxemia: Secondary | ICD-10-CM | POA: Diagnosis not present

## 2020-04-10 DIAGNOSIS — I499 Cardiac arrhythmia, unspecified: Secondary | ICD-10-CM | POA: Diagnosis not present

## 2020-04-10 DIAGNOSIS — Z823 Family history of stroke: Secondary | ICD-10-CM | POA: Diagnosis not present

## 2020-04-10 DIAGNOSIS — R29720 NIHSS score 20: Secondary | ICD-10-CM | POA: Diagnosis not present

## 2020-04-10 DIAGNOSIS — L308 Other specified dermatitis: Secondary | ICD-10-CM | POA: Diagnosis not present

## 2020-04-10 DIAGNOSIS — Z4659 Encounter for fitting and adjustment of other gastrointestinal appliance and device: Secondary | ICD-10-CM | POA: Diagnosis not present

## 2020-04-10 DIAGNOSIS — Z992 Dependence on renal dialysis: Secondary | ICD-10-CM | POA: Diagnosis not present

## 2020-04-10 DIAGNOSIS — G4700 Insomnia, unspecified: Secondary | ICD-10-CM | POA: Diagnosis not present

## 2020-04-10 DIAGNOSIS — Z4682 Encounter for fitting and adjustment of non-vascular catheter: Secondary | ICD-10-CM | POA: Diagnosis not present

## 2020-04-10 DIAGNOSIS — Z20822 Contact with and (suspected) exposure to covid-19: Secondary | ICD-10-CM | POA: Diagnosis present

## 2020-04-10 DIAGNOSIS — R471 Dysarthria and anarthria: Secondary | ICD-10-CM | POA: Diagnosis present

## 2020-04-10 DIAGNOSIS — N189 Chronic kidney disease, unspecified: Secondary | ICD-10-CM | POA: Diagnosis not present

## 2020-04-10 DIAGNOSIS — Z9119 Patient's noncompliance with other medical treatment and regimen: Secondary | ICD-10-CM

## 2020-04-10 DIAGNOSIS — Z4901 Encounter for fitting and adjustment of extracorporeal dialysis catheter: Secondary | ICD-10-CM | POA: Diagnosis not present

## 2020-04-10 DIAGNOSIS — Z6820 Body mass index (BMI) 20.0-20.9, adult: Secondary | ICD-10-CM

## 2020-04-10 DIAGNOSIS — E0941 Drug or chemical induced diabetes mellitus with neurological complications with diabetic mononeuropathy: Secondary | ICD-10-CM | POA: Diagnosis not present

## 2020-04-10 DIAGNOSIS — M2550 Pain in unspecified joint: Secondary | ICD-10-CM | POA: Diagnosis not present

## 2020-04-10 DIAGNOSIS — R4182 Altered mental status, unspecified: Secondary | ICD-10-CM

## 2020-04-10 DIAGNOSIS — L309 Dermatitis, unspecified: Secondary | ICD-10-CM | POA: Diagnosis not present

## 2020-04-10 DIAGNOSIS — R410 Disorientation, unspecified: Secondary | ICD-10-CM | POA: Diagnosis not present

## 2020-04-10 DIAGNOSIS — K2100 Gastro-esophageal reflux disease with esophagitis, without bleeding: Secondary | ICD-10-CM | POA: Diagnosis not present

## 2020-04-10 DIAGNOSIS — R627 Adult failure to thrive: Secondary | ICD-10-CM | POA: Diagnosis present

## 2020-04-10 DIAGNOSIS — G0490 Encephalitis and encephalomyelitis, unspecified: Secondary | ICD-10-CM | POA: Diagnosis not present

## 2020-04-10 DIAGNOSIS — L409 Psoriasis, unspecified: Secondary | ICD-10-CM | POA: Diagnosis present

## 2020-04-10 HISTORY — DX: Immunodeficiency, unspecified: D84.9

## 2020-04-10 LAB — COMPREHENSIVE METABOLIC PANEL
ALT: 10 U/L (ref 0–44)
AST: 13 U/L — ABNORMAL LOW (ref 15–41)
Albumin: 2.8 g/dL — ABNORMAL LOW (ref 3.5–5.0)
Alkaline Phosphatase: 95 U/L (ref 38–126)
Anion gap: 15 (ref 5–15)
BUN: 76 mg/dL — ABNORMAL HIGH (ref 8–23)
CO2: 13 mmol/L — ABNORMAL LOW (ref 22–32)
Calcium: 7.5 mg/dL — ABNORMAL LOW (ref 8.9–10.3)
Chloride: 115 mmol/L — ABNORMAL HIGH (ref 98–111)
Creatinine, Ser: 9.98 mg/dL — ABNORMAL HIGH (ref 0.61–1.24)
GFR calc Af Amer: 6 mL/min — ABNORMAL LOW (ref >60)
GFR calc non Af Amer: 5 mL/min — ABNORMAL LOW (ref >60)
Glucose, Bld: 106 mg/dL — ABNORMAL HIGH (ref 70–99)
Potassium: 4.2 mmol/L (ref 3.5–5.1)
Sodium: 143 mmol/L (ref 135–145)
Total Bilirubin: 0.5 mg/dL (ref 0.3–1.2)
Total Protein: 6.6 g/dL (ref 6.5–8.1)

## 2020-04-10 LAB — CBC WITH DIFFERENTIAL/PLATELET
Abs Immature Granulocytes: 0.03 10*3/uL (ref 0.00–0.07)
Basophils Absolute: 0 10*3/uL (ref 0.0–0.1)
Basophils Relative: 0 %
Eosinophils Absolute: 0.3 10*3/uL (ref 0.0–0.5)
Eosinophils Relative: 4 %
HCT: 31.5 % — ABNORMAL LOW (ref 39.0–52.0)
Hemoglobin: 10.3 g/dL — ABNORMAL LOW (ref 13.0–17.0)
Immature Granulocytes: 1 %
Lymphocytes Relative: 17 %
Lymphs Abs: 1.1 10*3/uL (ref 0.7–4.0)
MCH: 29.4 pg (ref 26.0–34.0)
MCHC: 32.7 g/dL (ref 30.0–36.0)
MCV: 90 fL (ref 80.0–100.0)
Monocytes Absolute: 0.5 10*3/uL (ref 0.1–1.0)
Monocytes Relative: 7 %
Neutro Abs: 4.7 10*3/uL (ref 1.7–7.7)
Neutrophils Relative %: 71 %
Platelets: 192 10*3/uL (ref 150–400)
RBC: 3.5 MIL/uL — ABNORMAL LOW (ref 4.22–5.81)
RDW: 14.7 % (ref 11.5–15.5)
WBC: 6.6 10*3/uL (ref 4.0–10.5)
nRBC: 0 % (ref 0.0–0.2)

## 2020-04-10 LAB — CBG MONITORING, ED: Glucose-Capillary: 91 mg/dL (ref 70–99)

## 2020-04-10 MED ORDER — HYDRALAZINE HCL 25 MG PO TABS
25.0000 mg | ORAL_TABLET | Freq: Three times a day (TID) | ORAL | Status: DC
  Administered 2020-04-11 – 2020-04-12 (×3): 25 mg via ORAL
  Filled 2020-04-10 (×3): qty 1

## 2020-04-10 MED ORDER — HYDRALAZINE HCL 20 MG/ML IJ SOLN
10.0000 mg | Freq: Once | INTRAMUSCULAR | Status: AC
  Administered 2020-04-13: 10 mg via INTRAVENOUS
  Filled 2020-04-10: qty 1

## 2020-04-10 MED ORDER — HYDRALAZINE HCL 20 MG/ML IJ SOLN
20.0000 mg | Freq: Once | INTRAMUSCULAR | Status: AC
  Administered 2020-04-10: 20 mg via INTRAVENOUS

## 2020-04-10 MED ORDER — CLOPIDOGREL BISULFATE 75 MG PO TABS
75.0000 mg | ORAL_TABLET | Freq: Every day | ORAL | Status: DC
  Administered 2020-04-11: 75 mg via ORAL
  Filled 2020-04-10 (×2): qty 1

## 2020-04-10 MED ORDER — CLONIDINE HCL 0.1 MG PO TABS
0.2000 mg | ORAL_TABLET | Freq: Two times a day (BID) | ORAL | Status: DC
  Administered 2020-04-11 (×3): 0.2 mg via ORAL
  Filled 2020-04-10: qty 1
  Filled 2020-04-10: qty 2
  Filled 2020-04-10: qty 1
  Filled 2020-04-10: qty 2

## 2020-04-10 MED ORDER — HYDRALAZINE HCL 20 MG/ML IJ SOLN
10.0000 mg | Freq: Four times a day (QID) | INTRAMUSCULAR | Status: DC | PRN
  Administered 2020-04-13 – 2020-04-22 (×7): 10 mg via INTRAVENOUS
  Filled 2020-04-10 (×10): qty 1

## 2020-04-10 MED ORDER — HYDRALAZINE HCL 25 MG PO TABS
25.0000 mg | ORAL_TABLET | Freq: Three times a day (TID) | ORAL | Status: DC
  Filled 2020-04-10: qty 1

## 2020-04-10 MED ORDER — CHLORTHALIDONE 25 MG PO TABS
25.0000 mg | ORAL_TABLET | Freq: Every day | ORAL | Status: DC
  Filled 2020-04-10: qty 1

## 2020-04-10 MED ORDER — HEPARIN SODIUM (PORCINE) 5000 UNIT/ML IJ SOLN
5000.0000 [IU] | Freq: Three times a day (TID) | INTRAMUSCULAR | Status: DC
  Administered 2020-04-11 – 2020-04-12 (×5): 5000 [IU] via SUBCUTANEOUS
  Filled 2020-04-10 (×5): qty 1

## 2020-04-10 MED ORDER — LEVETIRACETAM 500 MG PO TABS
500.0000 mg | ORAL_TABLET | Freq: Two times a day (BID) | ORAL | Status: DC
  Administered 2020-04-11: 500 mg via ORAL
  Filled 2020-04-10: qty 1

## 2020-04-10 MED ORDER — TAMSULOSIN HCL 0.4 MG PO CAPS
0.4000 mg | ORAL_CAPSULE | Freq: Every day | ORAL | Status: DC
  Administered 2020-04-11 – 2020-05-07 (×18): 0.4 mg via ORAL
  Filled 2020-04-10 (×22): qty 1

## 2020-04-10 MED ORDER — AMLODIPINE BESYLATE 10 MG PO TABS
10.0000 mg | ORAL_TABLET | Freq: Every day | ORAL | Status: DC
  Administered 2020-04-11: 10 mg via ORAL
  Filled 2020-04-10: qty 1
  Filled 2020-04-10: qty 2

## 2020-04-10 MED ORDER — LABETALOL HCL 5 MG/ML IV SOLN
10.0000 mg | Freq: Once | INTRAVENOUS | Status: AC
  Administered 2020-04-10: 10 mg via INTRAVENOUS
  Filled 2020-04-10: qty 4

## 2020-04-10 MED ORDER — PRAVASTATIN SODIUM 40 MG PO TABS
80.0000 mg | ORAL_TABLET | Freq: Every day | ORAL | Status: DC
  Administered 2020-04-11 – 2020-04-13 (×2): 80 mg via ORAL
  Filled 2020-04-10 (×2): qty 2

## 2020-04-10 MED ORDER — CHLORTHALIDONE 25 MG PO TABS: 25.0000 mg | ORAL_TABLET | Freq: Every day | ORAL | Status: DC

## 2020-04-10 MED ORDER — AMLODIPINE BESYLATE 5 MG PO TABS: 10.0000 mg | ORAL_TABLET | Freq: Every day | ORAL | Status: DC

## 2020-04-10 MED ORDER — CHLORTHALIDONE 25 MG PO TABS
25.0000 mg | ORAL_TABLET | Freq: Every day | ORAL | Status: DC
  Administered 2020-04-11: 25 mg via ORAL
  Filled 2020-04-10 (×2): qty 1

## 2020-04-10 MED ORDER — PANTOPRAZOLE SODIUM 40 MG PO TBEC
40.0000 mg | DELAYED_RELEASE_TABLET | Freq: Every day | ORAL | Status: DC
  Administered 2020-04-11 – 2020-04-14 (×2): 40 mg via ORAL
  Filled 2020-04-10 (×4): qty 1

## 2020-04-10 MED ORDER — FUROSEMIDE 40 MG PO TABS
40.0000 mg | ORAL_TABLET | Freq: Every day | ORAL | Status: DC
  Administered 2020-04-11: 40 mg via ORAL
  Filled 2020-04-10: qty 2
  Filled 2020-04-10: qty 1

## 2020-04-10 MED ORDER — LEVETIRACETAM IN NACL 1000 MG/100ML IV SOLN
1000.0000 mg | Freq: Once | INTRAVENOUS | Status: AC
  Administered 2020-04-10: 1000 mg via INTRAVENOUS
  Filled 2020-04-10: qty 100

## 2020-04-10 NOTE — ED Provider Notes (Signed)
[facility] EMERGENCY DEPARTMENT Provider Note   CSN: [id number] Arrival date & time: 04/10/20  2020     History Chief Complaint  Patient presents with  . Seizures    Richard [name] is a 64 y.o. male.  63 y.o male with a PMH of GERD, DM, Migraine, HTN emergency presents to the ED via EMS s/p seizure. Patient had a witnessed seizure by roommate, this approximately lasted 4 minutes and states patient is not compliant with any of his medications at this time. Patient is unable to provide a full history, reports he does not know that he is here for a seizure, however is aware he is currently at [facility]. Right arm appears contracted, he reports that it is not within his baseline however patient has had this in the past according to previous records review. Unable to obtain a thorough history as patient is currently post ictal, level 5 caveat.  The history is provided by medical records.       Past Medical History:  Diagnosis Date  . Arthritis   . Diabetes mellitus, type II, insulin dependent ([place])   . GERD (gastroesophageal reflux disease)   . Hypertension   . Hypertensive emergency 05/21/2012  . Migraines   . Neuropathy in diabetes ([place])    bilat feet  . Ruptured lumbar disc   . Stroke [facility]) 2013   residual right sided weakness (arm>leg)    Patient Active Problem List   Diagnosis Date Noted  . History of cerebrovascular accident (CVA) with residual deficit 03/19/2020  . CKD (chronic kidney disease) stage 5, GFR less than 15 ml/min (HCC) 02/15/2020  . Neuropathy in diabetes ([place])   . Migraines   . Hypertension   . GERD (gastroesophageal reflux disease)   . Diabetes mellitus, type II, insulin dependent ([place])   . Arthritis   . Hyperlipidemia 08/25/2012  . Chronic kidney disease, stage 4 (severe) ([place]) 08/23/2012  . Tobacco abuse 08/23/2012  . Protein-calorie malnutrition, mild ([place]) 08/23/2012  . 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 KNEE ARTHROSCOPY Bilateral        Family History  Problem Relation Age of Onset  . Other Mother   . Stroke Father   . Hypertension Father   . Epilepsy Father   . Breast cancer Sister   . Stroke Brother   . Heart attack Brother   . Diabetes Sister     Social History   Tobacco Use  . Smoking status: Current Every Day Smoker    Packs/day: 0.50    Types: Cigarettes  . Smokeless tobacco: Never Used  Vaping Use  . Vaping Use: Never used  Substance Use Topics  . Alcohol use: No  . Drug use: Yes    Types: Marijuana    Home Medications Prior to Admission medications   Medication Sig Start Date End Date Taking? Authorizing Provider  ACCU-CHEK AVIVA PLUS test strip 1 each by Other route See admin instructions. Use 1 test strip to test blood sugar two-three times daily 04/20/18   [provider]  ACCU-CHEK AVIVA PLUS test strip USE 1 strip TO test blood sugar 2 TIMES DAILY TO 3 TIMES DAILY Patient taking differently: 1 each by Other route in the morning, at noon, and at bedtime.  06/10/19   [name], MD  amLODipine (NORVASC) 10 MG tablet TAKE 1 TABLET BY MOUTH DAILY 03/28/20  [name], MD  blood glucose meter kit and supplies KIT Dispense based on patient and insurance preference. Check blood sugar 4 times a day Patient taking differently: Inject 1 each into the skin See admin instructions. Dispense based on patient and insurance preference. Check blood sugar 4 times a day 06/11/18   [name], MD  calcitRIOL (ROCALTROL) 0.25 MCG capsule Take 1 capsule (0.25 mcg total) by mouth daily. 03/07/20   [name], MD  chlorhexidine (PERIDEX) 0.12 % solution Use as directed 15 mLs in the mouth or throat See admin instructions. Swish with 19ms for 30 seconds then spit. May use two times daily. 06/01/18   [provider]  chlorthalidone (HYGROTON) 25 MG  tablet Take 1 tablet (25 mg total) by mouth daily. 03/07/20   KCaren Macadam MD  Cholecalciferol (VITAMIN D) 50 MCG (2000 UT) tablet Take 1 tablet (2,000 Units total) by mouth in the morning. 03/07/20   KCaren Macadam MD  cloNIDine (CATAPRES) 0.2 MG tablet Take 1 tablet (0.2 mg total) by mouth 2 (two) times daily. 03/07/20   KCaren Macadam MD  clopidogrel (PLAVIX) 75 MG tablet Take 1 tablet (75 mg total) by mouth daily. 03/07/20   KCaren Macadam MD  famotidine (PEPCID) 20 MG tablet Take 1 tablet (20 mg total) by mouth 2 (two) times daily. 03/07/20   KCaren Macadam MD  furosemide (LASIX) 40 MG tablet Take 1 tablet (40 mg total) by mouth daily. 03/07/20   KCaren Macadam MD  hydrALAZINE (APRESOLINE) 25 MG tablet Take 1 tablet (25 mg total) by mouth every 8 (eight) hours. 03/07/20   KCaren Macadam MD  hydrOXYzine (ATARAX/VISTARIL) 25 MG tablet Take 1 tablet (25 mg total) by mouth every 8 (eight) hours as needed for itching. 03/27/20   Tegeler, CGwenyth Allegra MD  insulin aspart protamine - aspart (NOVOLOG MIX 70/30 FLEXPEN) (70-30) 100 UNIT/ML FlexPen INJECT 28 UNITS INTO THE SKIN EVERY MORNING Patient taking differently: Inject 28 Units into the skin in the morning.  12/01/19   Koberlein, JSteele Berg MD  Insulin Pen Needle (SURE COMFORT PEN NEEDLES) 31G X 8 MM MISC Use as directed twice daily with Novolog flex pen Patient taking differently: 1 each by Other route See admin instructions. Use as directed twice daily with Novolog flex pen 02/10/19   Koberlein, JSteele Berg MD  levETIRAcetam (KEPPRA) 500 MG tablet Take 1 tablet (500 mg total) by mouth 2 (two) times daily. 03/07/20 04/06/20  KCaren Macadam MD  Misc. Devices (COMMODE 3-IN-1) MISC Use as directed 02/24/20   KCaren Macadam MD  Misc. Devices (TRANSFER BENCH) MISC Use as directed 02/24/20   Koberlein, JSteele Berg MD  NARCAN 4 MG/0.1ML LIQD nasal spray kit Place 0.4 mg into the nose once.  05/31/18   [provider]  Oxycodone HCl 20 MG TABS Take 20 mg by mouth every 6 (six) hours as needed (pain).  05/31/18   [provider]  pantoprazole (PROTONIX) 40 MG tablet TAKE 1 TABLET BY MOUTH EVERY DAY Patient taking differently: Take 40 mg by mouth daily.  01/23/20   Koberlein, JSteele Berg MD  potassium chloride SA (KLOR-CON) 20 MEQ tablet Take 20 mEq by mouth daily.  12/07/19   [provider]  pravastatin (PRAVACHOL) 80 MG tablet Take 1 tablet (80 mg total) by mouth at bedtime. 03/07/20   Koberlein, JSteele Berg MD  tamsulosin (FLOMAX) 0.4 MG CAPS capsule TAKE 1 CAPSULE BY MOUTH EVERY  DAY 03/28/20   [name], MD  TRUEPLUS INSULIN SYRINGE 31G X 5/16" 1 ML MISC Use pen needle with insulin 2 times daily Patient taking differently: 1 each by Other route in the morning and at bedtime. With insulin 03/31/19   [name], MD    Allergies    Patient has no known allergies.  Review of Systems   Review of Systems  Unable to perform ROS: Mental status change    Physical Exam Updated Vital Signs BP (!) 216/115   Pulse 89   Temp 98.3 F (36.8 C) (Axillary)   Resp (!) 21   SpO2 98%   Physical Exam Vitals and nursing note reviewed.  Constitutional:      Appearance: He is ill-appearing.  HENT:     Head: Normocephalic and atraumatic.     Nose: Nose normal.     Mouth/Throat:     Mouth: Mucous membranes are dry.  Eyes:     Pupils: Pupils are equal, round, and reactive to light.  Cardiovascular:     Rate and Rhythm: Normal rate.  Pulmonary:     Effort: Pulmonary effort is normal.  Abdominal:     General: Abdomen is flat.     Tenderness: There is no abdominal tenderness. There is no right CVA tenderness or left CVA tenderness.  Musculoskeletal:     Cervical back: Normal range of motion and neck supple.  Skin:    General: Skin is warm and dry.  Neurological:     Mental Status: He is alert. He is disoriented.     Cranial Nerves: Dysarthria present.     Comments:  Dysarthric  speech. There is 4/5 strength in the left upper and left lower extremity. Contraction to the right upper extremity.     ED Results / Procedures / Treatments   Labs (all labs ordered are listed, but only abnormal results are displayed) Labs Reviewed  CBC WITH DIFFERENTIAL/PLATELET - Abnormal; Notable for the following components:      Result Value   RBC 3.50 (*)    Hemoglobin 10.3 (*)    HCT 31.5 (*)    All other components within normal limits  COMPREHENSIVE METABOLIC PANEL - Abnormal; Notable for the following components:   Chloride 115 (*)    CO2 13 (*)    Glucose, Bld 106 (*)    BUN 76 (*)    Creatinine, Ser 9.98 (*)    Calcium 7.5 (*)    Albumin 2.8 (*)    AST 13 (*)    GFR calc non Af Amer 5 (*)    GFR calc Af Amer 6 (*)    All other components within normal limits  URINALYSIS, ROUTINE W REFLEX MICROSCOPIC  LEVETIRACETAM LEVEL  CBG MONITORING, ED    EKG None  Radiology CT Head Wo Contrast  Result Date: 04/10/2020 CLINICAL DATA:  Seizure, altered level of consciousness EXAM: CT HEAD WITHOUT CONTRAST TECHNIQUE: Contiguous axial images were obtained from the base of the skull through the vertex without intravenous contrast. COMPARISON:  03/27/2020 FINDINGS: Brain: Evaluation is limited due to patient motion during the exam. No signs of acute infarct or hemorrhage. Lateral ventricles and midline structures are stable. No acute extra-axial fluid collections. No mass effect. Vascular: No hyperdense vessel or unexpected calcification. Skull: Normal. Negative for fracture or focal lesion. Sinuses/Orbits: There is mucosal thickening within the bilateral maxillary sinuses and left frontal sinus, grossly stable. Other: None. IMPRESSION: 1. No acute intracranial process on this exam limited  by excessive patient motion. No change since previous study. Electronically Signed   By: Randa Ngo M.D.   On: 04/10/2020 21:59    Procedures Procedures (including critical care  time)  Medications Ordered in ED Medications  hydrALAZINE (APRESOLINE) injection 10 mg (has no administration in time range)  amLODipine (NORVASC) tablet 10 mg (has no administration in time range)  chlorthalidone (HYGROTON) tablet 25 mg (has no administration in time range)  clopidogrel (PLAVIX) tablet 75 mg (has no administration in time range)  levETIRAcetam (KEPPRA) IVPB 1000 mg/100 mL premix (0 mg Intravenous Stopped 04/10/20 2216)  labetalol (NORMODYNE) injection 10 mg (10 mg Intravenous Given 04/10/20 2231)    ED Course  I have reviewed the triage vital signs and the nursing notes.  Pertinent labs & imaging results that were available during my care of the patient were reviewed by me and considered in my medical decision making (see chart for details).    MDM Rules/Calculators/A&P   Patient with an extensive PMH including Seizure, HTN, CKD 5 presents to the ED with a chief complaint of seizure.  Patient has a history of noncompliant with medication according to extensive chart review, he arrived in the ED with a blood pressure systolic around the 546F, diastolic in the 681E, heart rate is slightly elevated, mild tachypnea noted.  He was drowsy on arrival and altered, however now is able to answer questions, does have dysarthria my exam.  He has chronically ill-appearing, does have a contracted right arm from a previous CVA according to chart review.  Denies any shortness of breath, chest pain, abdominal pain at this time.  According to chart, patient has missed several appointments for follow-up for his high blood pressure along with his seizure disorder.  Rotation of his labs revealed a CBC that is at baseline with prior visits.  CMP remarkable for creatinine of 9.98, according to his chart, patient has had discussion about dialysis however refuses to do so at this time, and is actually improved from his previous visit.  BUN is 76 elevated from prior.  A Keppra level is currently  pending.  UA is also pending.   CT Head w/o contrast show: 1. No acute intracranial process on this exam limited by excessive  patient motion. No change since previous study.     10:32 PM a consult for neurology was placed as patient does have a previous history of seizure disorder, he was loaded with Keppra 1000 mg via IV.  Will consult neurology for further recommendations.  Blood pressure remains elevated at this time, with the diastolic in the 751Z and a systolic in the 001V, also provided with 10 mg of labetalol  According to extensive chart review, patient does have prior admissions due to hypertensive urgency, unsure whether this is causing his seizures versus medication noncompliance.  Spoke to neurology Dr. Leonel Ramsay who recommended continuation of home medications at this time.  Unless patient has any neurological changes neurology will not be seen patient while in the ED.  Auscultation placed to hospitalist service for hospitalist admission at this time.UA is pending.  11:07 PM Spoke to Dr. [facility] hospitalist service who reviewed patient's chart, has added hydralazine via IV, in addition patient is also received amlodipine 10 mg via oral, along with chlorthalidone 25 mg, will re evaluate BP prior to placing admission orders.    Portions of this note were generated with [employer]. Dictation errors may occur despite best attempts at proofreading.  Final Clinical Impression(s) / ED  Diagnoses Final diagnoses:  Seizure [facility])  Hypertensive urgency    Rx / DC Orders ED Discharge Orders    None       Janeece Fitting, Hershal Coria 04/10/20 2312    Tegeler, Gwenyth Allegra, MD 04/10/20 [phone]

## 2020-04-10 NOTE — ED Notes (Signed)
Pt now answering yes/no questions and will tell us his name.

## 2020-04-10 NOTE — ED Triage Notes (Signed)
Pt arrives from home via [facility] post seizure. Per roommate pt had a 4 min grand mal seizure at approx 1931. Pt now postdictal, non verbal, withdraws from pain. Pt had seizure in recliner and was found in same upon EMS arrival.  Per roommate pt is not currently taking medications due to refusal and has significant history of seizures

## 2020-04-10 NOTE — ED Notes (Signed)
Pt now oriented x3 and alert

## 2020-04-10 NOTE — H&P (Addendum)
TRH H&P   Patient Demographics:    Richard [name], is a 64 y.o. male  MRN: [id number]   DOB - 10-26-1955  Admit Date - 04/10/2020  Outpatient Primary MD for the patient is [name], MD  Referring MD/NP/PA: PA Soto.  Patient coming from: Home  Chief Complaint  Patient presents with  . Seizures      HPI:    Richard [name]  is a 64 y.o. male, with medical history significant ofdiabetes, osteoarthritis, hypertension,CKD 5,neuropathies, previous CVA with residual right-sided weakness, migraine headaches , patient with extremely poor compliance, he has refused hemodialysis access in the past, presents to ED via EMS status post seizures, he is known history of seizures, but has not been noncompliant with his seizure medications, seizures was witnessed by his roommate, seizures lasted for 4 minutes, she will patient's extremely encephalopathic, unable to provide any history, but upon my evaluation is more appropriate, patient report actually he never got his seizure medications, was not taking it, patient denies any fever, chest pain, shortness of breath, no dysuria or polyuria, reports usually ambulates with a walker, he has right-sided weakness at baseline. -in ED patient was postictal, he was loaded with Keppra, ED discussed with neurology who recommended to resume his home dose Keppra 500 mg p.o. twice daily, as well patient was noted to be hypertensive 230/118, patient received IV labetalol, IV hydralazine, as well patient with significant labs abnormalities which appears to be at baseline, including creatinine of 9.98, BUN of 76, baseline anemia with hemoglobin of 10.3, hospitalist were requested to admit    Review of systems:    In addition to the HPI above,  No Fever-chills, presents with seizure and postictal status No Headache, No changes with Vision or hearing, No  problems swallowing food or Liquids, No Chest pain, Cough or Shortness of Breath, No Abdominal pain, No Nausea or Vommitting, Bowel movements are regular, No Blood in stool or Urine, No dysuria, No new skin rashes or bruises, No new joints pains-aches,  No new weakness, tingling, numbness in any extremity, No recent weight gain or loss, No polyuria, polydypsia or polyphagia, No significant Mental Stressors.  A full 10 point Review of Systems was done, except as stated above, all other Review of Systems were negative.   With Past History of the following :    Past Medical History:  Diagnosis Date  . Arthritis   . Diabetes mellitus, type II, insulin dependent ([place])   . GERD (gastroesophageal reflux disease)   . Hypertension   . Hypertensive emergency 05/21/2012  . Migraines   . Neuropathy in diabetes ([place])    bilat feet  . Ruptured lumbar disc   . Stroke [facility]) 2013   residual right sided weakness (arm>leg)      Past Surgical History:  Procedure Laterality Date  . KNEE ARTHROSCOPY Bilateral       Social  History:     Social History   Tobacco Use  . Smoking status: Current Every Day Smoker    Packs/day: 0.50    Types: Cigarettes  . Smokeless tobacco: Never Used  Substance Use Topics  . Alcohol use: No     Lives -with a roommate  Mobility -reportedly ambulates with a walker, but I doubt that     Family History :     Family History  Problem Relation Age of Onset  . Other Mother   . Stroke Father   . Hypertension Father   . Epilepsy Father   . Breast cancer Sister   . Stroke Brother   . Heart attack Brother   . Diabetes Sister      Home Medications:   Prior to Admission medications   Medication Sig Start Date End Date Taking? Authorizing Provider  ACCU-CHEK AVIVA PLUS test strip 1 each by Other route See admin instructions. Use 1 test strip to test blood sugar two-three times daily 04/20/18   [provider]  ACCU-CHEK AVIVA PLUS test  strip USE 1 strip TO test blood sugar 2 TIMES DAILY TO 3 TIMES DAILY Patient taking differently: 1 each by Other route in the morning, at noon, and at bedtime.  06/10/19   Koberlein, [name], MD  amLODipine (NORVASC) 10 MG tablet TAKE 1 TABLET BY MOUTH DAILY 03/28/20   [name], MD  blood glucose meter kit and supplies KIT Dispense based on patient and insurance preference. Check blood sugar 4 times a day Patient taking differently: Inject 1 each into the skin See admin instructions. Dispense based on patient and insurance preference. Check blood sugar 4 times a day 06/11/18   [name], MD  calcitRIOL (ROCALTROL) 0.25 MCG capsule Take 1 capsule (0.25 mcg total) by mouth daily. 03/07/20   [name], MD  chlorhexidine (PERIDEX) 0.12 % solution Use as directed 15 mLs in the mouth or throat See admin instructions. Swish with 56ms for 30 seconds then spit. May use two times daily. 06/01/18   [provider]  chlorthalidone (HYGROTON) 25 MG tablet Take 1 tablet (25 mg total) by mouth daily. 03/07/20   KCaren Macadam MD  Cholecalciferol (VITAMIN D) 50 MCG (2000 UT) tablet Take 1 tablet (2,000 Units total) by mouth in the morning. 03/07/20   KCaren Macadam MD  cloNIDine (CATAPRES) 0.2 MG tablet Take 1 tablet (0.2 mg total) by mouth 2 (two) times daily. 03/07/20   KCaren Macadam MD  clopidogrel (PLAVIX) 75 MG tablet Take 1 tablet (75 mg total) by mouth daily. 03/07/20   KCaren Macadam MD  famotidine (PEPCID) 20 MG tablet Take 1 tablet (20 mg total) by mouth 2 (two) times daily. 03/07/20   KCaren Macadam MD  furosemide (LASIX) 40 MG tablet Take 1 tablet (40 mg total) by mouth daily. 03/07/20   KCaren Macadam MD  hydrALAZINE (APRESOLINE) 25 MG tablet Take 1 tablet (25 mg total) by mouth every 8 (eight) hours. 03/07/20   KCaren Macadam MD  hydrOXYzine (ATARAX/VISTARIL) 25 MG tablet Take 1 tablet (25 mg total) by mouth every 8 (eight) hours as  needed for itching. 03/27/20   Tegeler, CGwenyth Allegra MD  insulin aspart protamine - aspart (NOVOLOG MIX 70/30 FLEXPEN) (70-30) 100 UNIT/ML FlexPen INJECT 28 UNITS INTO THE SKIN EVERY MORNING Patient taking differently: Inject 28 Units into the skin in the morning.  12/01/19   KCaren Macadam MD  Insulin Pen  Needle (SURE COMFORT PEN NEEDLES) 31G X 8 MM MISC Use as directed twice daily with Novolog flex pen Patient taking differently: 1 each by Other route See admin instructions. Use as directed twice daily with Novolog flex pen 02/10/19   Koberlein, [name], MD  levETIRAcetam (KEPPRA) 500 MG tablet Take 1 tablet (500 mg total) by mouth 2 (two) times daily. 03/07/20 04/06/20  [name], MD  Misc. Devices (COMMODE 3-IN-1) MISC Use as directed 02/24/20   [name], MD  Misc. Devices (TRANSFER BENCH) MISC Use as directed 02/24/20   Koberlein, [name], MD  NARCAN 4 MG/0.1ML LIQD nasal spray kit Place 0.4 mg into the nose once.  05/31/18   [provider]  Oxycodone HCl 20 MG TABS Take 20 mg by mouth every 6 (six) hours as needed (pain).  05/31/18   [provider]  pantoprazole (PROTONIX) 40 MG tablet TAKE 1 TABLET BY MOUTH EVERY DAY Patient taking differently: Take 40 mg by mouth daily.  01/23/20   Koberlein, [name], MD  potassium chloride SA (KLOR-CON) 20 MEQ tablet Take 20 mEq by mouth daily.  12/07/19   [provider]  pravastatin (PRAVACHOL) 80 MG tablet Take 1 tablet (80 mg total) by mouth at bedtime. 03/07/20   [name], MD  tamsulosin (FLOMAX) 0.4 MG CAPS capsule TAKE 1 CAPSULE BY MOUTH EVERY DAY 03/28/20   [name], MD  TRUEPLUS INSULIN SYRINGE 31G X 5/16" 1 ML MISC Use pen needle with insulin 2 times daily Patient taking differently: 1 each by Other route in the morning and at bedtime. With insulin 03/31/19   [name], MD     Allergies:    No Known Allergies   Physical Exam:   Vitals  Blood pressure (!) 216/115,  pulse 89, temperature 98.3 F (36.8 C), temperature source Axillary, resp. rate (!) 21, SpO2 98 %.   1. General Remley frail, deconditioned male, laying in bed, in no apparent distress  2.  Mentation has significantly improved, answering most questions appropriately, awake alert oriented x3.  3. No F.N deficits, ALL C.Nerves Intact, patient with dense right-sided hemiparesis.  4. Ears and Eyes appear Normal, Conjunctivae clear, PERRLA. Moist Oral Mucosa.  5. Supple Neck, No JVD, No cervical lymphadenopathy appriciated, No Carotid Bruits.  6. Symmetrical Chest wall movement, Good air movement bilaterally, CTAB.  7. RRR, No Gallops, Rubs or Murmurs, No Parasternal Heave.  8. Positive Bowel Sounds, Abdomen Soft, No tenderness, No organomegaly appriciated,No rebound -guarding or rigidity.  9.  No Cyanosis, Normal Skin Turgor, No Skin Rash or Bruise.  10. Good muscle tone,  joints appear normal , no effusions, Normal ROM.  11. No Palpable Lymph Nodes in Neck or Axillae    Data Review:    CBC Recent Labs  Lab 04/10/20 2037  WBC 6.6  HGB 10.3*  HCT 31.5*  PLT 192  MCV 90.0  MCH 29.4  MCHC 32.7  RDW 14.7  LYMPHSABS 1.1  MONOABS 0.5  EOSABS 0.3  BASOSABS 0.0   ------------------------------------------------------------------------------------------------------------------  Chemistries  Recent Labs  Lab 04/10/20 2037  NA 143  K 4.2  CL 115*  CO2 13*  GLUCOSE 106*  BUN 76*  CREATININE 9.98*  CALCIUM 7.5*  AST 13*  ALT 10  ALKPHOS 95  BILITOT 0.5   ------------------------------------------------------------------------------------------------------------------ CrCl cannot be calculated (Unknown ideal weight.). ------------------------------------------------------------------------------------------------------------------ No results for input(s): TSH, T4TOTAL, T3FREE, THYROIDAB in the last 72 hours.  Invalid input(s): FREET3  Coagulation  profile No  results for input(s): INR, PROTIME in the last 168 hours. ------------------------------------------------------------------------------------------------------------------- No results for input(s): DDIMER in the last 72 hours. -------------------------------------------------------------------------------------------------------------------  Cardiac Enzymes No results for input(s): CKMB, TROPONINI, MYOGLOBIN in the last 168 hours.  Invalid input(s): CK ------------------------------------------------------------------------------------------------------------------ No results found for: BNP   ---------------------------------------------------------------------------------------------------------------  Urinalysis    Component Value Date/Time   COLORURINE YELLOW 03/18/2017 [place] 03/18/2017 1656   LABSPEC 1.024 03/18/2017 1656   PHURINE 5.5 03/18/2017 1656   GLUCOSEU >=500 (A) 03/18/2017 1656   HGBUR NEGATIVE 03/18/2017 1656   BILIRUBINUR NEGATIVE 03/18/2017 1656   KETONESUR NEGATIVE 03/18/2017 1656   PROTEINUR 30 (A) 03/18/2017 1656   UROBILINOGEN 0.2 11/29/2014 2003   NITRITE NEGATIVE 03/18/2017 1656   LEUKOCYTESUR NEGATIVE 03/18/2017 1656    ----------------------------------------------------------------------------------------------------------------   Imaging Results:    CT Head Wo Contrast  Result Date: 04/10/2020 CLINICAL DATA:  Seizure, altered level of consciousness EXAM: CT HEAD WITHOUT CONTRAST TECHNIQUE: Contiguous axial images were obtained from the base of the skull through the vertex without intravenous contrast. COMPARISON:  03/27/2020 FINDINGS: Brain: Evaluation is limited due to patient motion during the exam. No signs of acute infarct or hemorrhage. Lateral ventricles and midline structures are stable. No acute extra-axial fluid collections. No mass effect. Vascular: No hyperdense vessel or unexpected calcification. Skull: Normal. Negative  for fracture or focal lesion. Sinuses/Orbits: There is mucosal thickening within the bilateral maxillary sinuses and left frontal sinus, grossly stable. Other: None. IMPRESSION: 1. No acute intracranial process on this exam limited by excessive patient motion. No change since previous study. Electronically Signed   By: Randa Ngo M.D.   On: 04/10/2020 21:59     Assessment & Plan:    Active Problems:   Tobacco abuse   Protein-calorie malnutrition, mild (HCC)   Hyperlipidemia   Neuropathy in diabetes (HCC)   GERD (gastroesophageal reflux disease)   Diabetes mellitus, type II, insulin dependent (HCC)   CKD (chronic kidney disease) stage 5, GFR less than 15 ml/min (HCC)   History of cerebrovascular accident (CVA) with residual deficit   Seizure (HCC)  Seizures -This is due to medication noncompliance, he was loaded with 1 g of Keppra in ED, will continue with home dose Keppra 500 mg oral twice daily.  Acute metabolic encephalopathy -Multifactorial, mentation appears to be improved, this is most likely due to postictal status, and hypertensive urgency -No acute findings. -Mentation appears to be improving.  Hypertensive urgency -Blood pressure significantly elevated in ED, at one point with systolic blood pressure 301, this is secondary to noncompliance with medications, given his home dose Norvasc, hydralazine and chlorthalidone for now, and resume his home meds including clonidine as scheduled. -He been receiving as needed labetalol and hydralazine, if no improvement with current regimen likely he will require nicardipine drip.  Addendum: -When trying to give the patient his p.o. antihypertensive regimen, he started to cough, so he will be kept n.p.o., will consult SLP, will transition medications to IV, will change his Keppra to IV, given he can take p.o. hypertensive medication, so I will go ahead and start him on nitro drip.   CKD stage V -Creatinine in 10-11 range, creatinine  appears to be at baseline . -His BUN is 76, which is around his baseline. -Patient has refused dialysis in the past, I have discussed with the patient, currently report he is agreeable if it felt necessary, will have morning team consult renal.  HLD -Resume home meds   DM2 -  Is on Novolin 70/30, will start on low-dose Lantus and insulin sliding scale  Tobacco use -Counseled to quit using this.  History of CVA with residual right-sided weakness -Continue with Plavix and statin  history of severe noncompliance -He was counseled.  DVT Prophylaxis  Lovenox - SCDs  AM Labs Ordered, also please review Full Orders  Family Communication: Admission, patients condition and plan of care including tests being ordered have been discussed with the patient and daughter via phone who indicate understanding and agree with the plan and Code Status.  Code Status Full  Likely DC to  home  Condition GUARDED   Consults called: none  Admission status:   inpatient  Time spent in minutes : 60 minutes   Phillips Climes M.D on 04/10/2020 at 11:32 PM   [facility] - Office  [phone]

## 2020-04-11 ENCOUNTER — Inpatient Hospital Stay (HOSPITAL_COMMUNITY): Payer: Medicare HMO

## 2020-04-11 ENCOUNTER — Encounter (HOSPITAL_COMMUNITY): Payer: Self-pay | Admitting: Internal Medicine

## 2020-04-11 DIAGNOSIS — N185 Chronic kidney disease, stage 5: Secondary | ICD-10-CM | POA: Diagnosis not present

## 2020-04-11 DIAGNOSIS — I6930 Unspecified sequelae of cerebral infarction: Secondary | ICD-10-CM | POA: Diagnosis not present

## 2020-04-11 DIAGNOSIS — R569 Unspecified convulsions: Secondary | ICD-10-CM

## 2020-04-11 DIAGNOSIS — E119 Type 2 diabetes mellitus without complications: Secondary | ICD-10-CM | POA: Diagnosis not present

## 2020-04-11 LAB — BASIC METABOLIC PANEL
Anion gap: 17 — ABNORMAL HIGH (ref 5–15)
BUN: 78 mg/dL — ABNORMAL HIGH (ref 8–23)
CO2: 11 mmol/L — ABNORMAL LOW (ref 22–32)
Calcium: 7.6 mg/dL — ABNORMAL LOW (ref 8.9–10.3)
Chloride: 117 mmol/L — ABNORMAL HIGH (ref 98–111)
Creatinine, Ser: 9.99 mg/dL — ABNORMAL HIGH (ref 0.61–1.24)
GFR calc Af Amer: 6 mL/min — ABNORMAL LOW (ref >60)
GFR calc non Af Amer: 5 mL/min — ABNORMAL LOW (ref >60)
Glucose, Bld: 94 mg/dL (ref 70–99)
Potassium: 4.4 mmol/L (ref 3.5–5.1)
Sodium: 145 mmol/L (ref 135–145)

## 2020-04-11 LAB — HIV ANTIBODY (ROUTINE TESTING W REFLEX): HIV Screen 4th Generation wRfx: REACTIVE — AB

## 2020-04-11 LAB — URINALYSIS, ROUTINE W REFLEX MICROSCOPIC
Bilirubin Urine: NEGATIVE
Glucose, UA: 50 mg/dL — AB
Ketones, ur: NEGATIVE mg/dL
Leukocytes,Ua: NEGATIVE
Nitrite: NEGATIVE
Protein, ur: >=300 mg/dL — AB
Specific Gravity, Urine: 1.011 (ref 1.005–1.030)
pH: 5 (ref 5.0–8.0)

## 2020-04-11 LAB — CBC
HCT: 29.4 % — ABNORMAL LOW (ref 39.0–52.0)
HCT: 34.5 % — ABNORMAL LOW (ref 39.0–52.0)
Hemoglobin: 11.4 g/dL — ABNORMAL LOW (ref 13.0–17.0)
Hemoglobin: 9.7 g/dL — ABNORMAL LOW (ref 13.0–17.0)
MCH: 28.9 pg (ref 26.0–34.0)
MCH: 29.5 pg (ref 26.0–34.0)
MCHC: 33 g/dL (ref 30.0–36.0)
MCHC: 33 g/dL (ref 30.0–36.0)
MCV: 87.6 fL (ref 80.0–100.0)
MCV: 89.4 fL (ref 80.0–100.0)
Platelets: 195 10*3/uL (ref 150–400)
Platelets: 201 10*3/uL (ref 150–400)
RBC: 3.29 MIL/uL — ABNORMAL LOW (ref 4.22–5.81)
RBC: 3.94 MIL/uL — ABNORMAL LOW (ref 4.22–5.81)
RDW: 14.4 % (ref 11.5–15.5)
RDW: 14.4 % (ref 11.5–15.5)
WBC: 6.8 10*3/uL (ref 4.0–10.5)
WBC: 9.1 10*3/uL (ref 4.0–10.5)
nRBC: 0 % (ref 0.0–0.2)
nRBC: 0 % (ref 0.0–0.2)

## 2020-04-11 LAB — CREATININE, SERUM
Creatinine, Ser: 9.99 mg/dL — ABNORMAL HIGH (ref 0.61–1.24)
GFR calc Af Amer: 6 mL/min — ABNORMAL LOW (ref >60)
GFR calc non Af Amer: 5 mL/min — ABNORMAL LOW (ref >60)

## 2020-04-11 LAB — RAPID URINE DRUG SCREEN, HOSP PERFORMED
Amphetamines: NOT DETECTED
Barbiturates: NOT DETECTED
Benzodiazepines: NOT DETECTED
Cocaine: NOT DETECTED
Opiates: NOT DETECTED
Tetrahydrocannabinol: NOT DETECTED

## 2020-04-11 LAB — SARS CORONAVIRUS 2 BY RT PCR (HOSPITAL ORDER, PERFORMED IN ~~LOC~~ HOSPITAL LAB): SARS Coronavirus 2: NEGATIVE

## 2020-04-11 LAB — GLUCOSE, CAPILLARY: Glucose-Capillary: 115 mg/dL — ABNORMAL HIGH (ref 70–99)

## 2020-04-11 MED ORDER — NITROGLYCERIN IN D5W 200-5 MCG/ML-% IV SOLN
0.0000 ug/min | INTRAVENOUS | Status: DC
  Administered 2020-04-11: 10 ug/min via INTRAVENOUS
  Filled 2020-04-11 (×2): qty 250

## 2020-04-11 MED ORDER — LABETALOL HCL 5 MG/ML IV SOLN
5.0000 mg | INTRAVENOUS | Status: DC | PRN
  Administered 2020-04-12 – 2020-04-13 (×5): 5 mg via INTRAVENOUS
  Filled 2020-04-11 (×5): qty 4

## 2020-04-11 MED ORDER — LEVETIRACETAM IN NACL 500 MG/100ML IV SOLN
500.0000 mg | Freq: Two times a day (BID) | INTRAVENOUS | Status: AC
  Administered 2020-04-11 – 2020-04-12 (×4): 500 mg via INTRAVENOUS
  Filled 2020-04-11 (×5): qty 100

## 2020-04-11 NOTE — Progress Notes (Signed)
PROGRESS NOTE    Richard [name]  WUJ:[id number] DOB: 1956-04-08 DOA: 04/10/2020 PCP: [name], MD    Chief Complaint  Patient presents with  . Seizures    Brief Narrative:  Richard [name]  is a 64 y.o. male, with medical history significant ofdiabetes, osteoarthritis, hypertension,CKD 5,neuropathies, previous CVA with residual right-sided weakness, migraine headaches , patient with extremely poor compliance, he has refused hemodialysis access in the past, presents to ED via EMS status post seizures, he is known history of seizures, but has not been noncompliant with his seizure medications, seizures was witnessed by his roommate, seizures lasted for 4 minutes, she will patient's extremely encephalopathic, unable to provide any history, but upon my evaluation is more appropriate, patient report actually he never got his seizure medications, was not taking it, patient denies any fever, chest pain, shortness of breath, no dysuria or polyuria, reports usually ambulates with a walker, he has right-sided weakness at baseline. -in ED patient was postictal, he was loaded with Keppra, ED discussed with neurology who recommended to resume his home dose Keppra 500 mg p.o. twice daily, as well patient was noted to be hypertensive 230/118, patient received IV labetalol, IV hydralazine, as well patient with significant labs abnormalities which appears to be at baseline, including creatinine of 9.98, BUN of 76, baseline anemia with hemoglobin of 10.3, hospitalist were requested to admit  Subjective:   interactive, and answering questions, drift back to sleep after several questions , Not oriented to time, he knows he is in the hospital He remains on nitro drip for blood pressure control Per RN make patient is making urine He denies short of breath, no chest pain, no hypoxia  Assessment & Plan:   Active Problems:   Tobacco abuse   Protein-calorie malnutrition, mild (HCC)    Hyperlipidemia   Neuropathy in diabetes (HCC)   GERD (gastroesophageal reflux disease)   Diabetes mellitus, type II, insulin dependent (HCC)   CKD (chronic kidney disease) stage 5, GFR less than 15 ml/min (HCC)   History of cerebrovascular accident (CVA) with residual deficit   Seizure (HCC)   Seizures -This is due to medication noncompliance, he was loaded with 1 g of Keppra in ED, will continue with home dose Keppra 500 mg oral twice daily.  Acute metabolic encephalopathy -Multifactorial, mentation appears to be improved, this is most likely due to postictal status, and hypertensive urgency -No acute findings. -Mentation appears to be improving.  Hypertensive urgency -Blood pressure significantly elevated in ED with systolic blood pressure 956, this is secondary to noncompliance with medications,  -He was seen post ictal state , not able to tolerate oral meds consistently , he started on nitro drip given his home dose Norvasc, hydralazine and chlorthalidone for now, and resume his home meds including clonidine as scheduled. -Become more alert, wean off nitro drip as able,   CKD stage V/uremia -Creatinine in 10-11 range, creatinine appears to be at baseline . -His BUN is 76 -No edema -Patient has refused dialysis in the past, I have discussed with the patient, currently report he is agreeable if it felt necessary, willconsult renal.  HLD -Resume home meds   DM2 -Is on Novolin 70/30, will start on low-dose Lantus and insulin sliding scale  Tobacco use -Counseled to quit using this.  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    DVT prophylaxis: heparin injection 5,000 Units Start:  04/10/20 2345 SCDs Start: 04/10/20 2332   Code Status: Family Communication:  Disposition:   Status is: Inpatient     Dispo: The patient is  from: Home, he lives with a roommate              Anticipated d/c is to: To be determined              Anticipated d/c date is: To be determined              Patient currently on nitro drip for blood pressure, elevated BUN/creatinine, still confused and drowsy from seizure  Consultants:   We will call nephrology in the morning  Procedures:   None  Antimicrobials:   None     Objective: Vitals:   04/11/20 0645 04/11/20 0653 04/11/20 0700 04/11/20 0730  BP: (!) 186/107 (!) 172/106 (!) 171/96 (!) 155/95  Pulse: (!) 109 (!) 108 (!) 102 95  Resp: 19 (!) 22 (!) 21 19  Temp:      TempSrc:      SpO2: 97% 99% 99% 97%   No intake or output data in the 24 hours ending 04/11/20 0812 There were no vitals filed for this visit.  Examination:  General exam: Still drowsy, drifted back to sleep after conversation Respiratory system: Clear to auscultation. Respiratory effort normal. Cardiovascular system: S1 & S2 heard, RRR. No JVD, no murmur, No pedal edema. Gastrointestinal system: Abdomen is nondistended, soft and nontender. No organomegaly or masses felt. Normal bowel sounds heard. Central nervous system: Drowsy, not oriented to time, chronic right upper extremity contracture Extremities: Right hemiparesis with right upper extremity contracture Skin: No rashes, lesions or ulcers Psychiatry: Drowsy.     Data Reviewed: I have personally reviewed following labs and imaging studies  CBC: Recent Labs  Lab 04/10/20 2037 04/11/20 0018 04/11/20 0402  WBC 6.6 6.8 9.1  NEUTROABS 4.7  --   --   HGB 10.3* 11.4* 9.7*  HCT 31.5* 34.5* 29.4*  MCV 90.0 87.6 89.4  PLT 192 201 539    Basic Metabolic Panel: Recent Labs  Lab 04/10/20 2037 04/11/20 0018 04/11/20 0402  NA 143  --  145  K 4.2  --  4.4  CL 115*  --  117*  CO2 13*  --  11*  GLUCOSE 106*  --  94  BUN 76*  --  78*  CREATININE 9.98* 9.99* 9.99*  CALCIUM 7.5*  --  7.6*    GFR: CrCl cannot be calculated (Unknown ideal  weight.).  Liver Function Tests: Recent Labs  Lab 04/10/20 2037  AST 13*  ALT 10  ALKPHOS 95  BILITOT 0.5  PROT 6.6  ALBUMIN 2.8*    CBG: Recent Labs  Lab 04/10/20 2031  GLUCAP 91     Recent Results (from the past 240 hour(s))  SARS Coronavirus 2 by RT PCR (hospital order, performed in [facility] hospital lab) Nasopharyngeal Nasopharyngeal Swab     Status: None   Collection Time: 04/11/20  3:58 AM   Specimen: Nasopharyngeal Swab  Result Value Ref Range Status   SARS Coronavirus 2 NEGATIVE NEGATIVE Final    Comment: (NOTE) SARS-CoV-2 target nucleic acids are NOT DETECTED.  The SARS-CoV-2 RNA is generally detectable in upper and lower respiratory specimens during the acute phase of infection. The lowest concentration of SARS-CoV-2 viral copies this assay can detect is 250 copies / mL. A negative result does not preclude SARS-CoV-2 infection and should not be used as the sole basis for  treatment or other patient management decisions.  A negative result may occur with improper specimen collection / handling, submission of specimen other than nasopharyngeal swab, presence of viral mutation(s) within the areas targeted by this assay, and inadequate number of viral copies (<250 copies / mL). A negative result must be combined with clinical observations, patient history, and epidemiological information.  Fact Sheet for Patients:   StrictlyIdeas.no  Fact Sheet for Healthcare Providers: BankingDealers.co.za  This test is not yet approved or  cleared by the [place] FDA and has been authorized for detection and/or diagnosis of SARS-CoV-2 by FDA under an Emergency Use Authorization (EUA).  This EUA will remain in effect (meaning this test can be used) for the duration of the COVID-19 declaration under Section 564(b)(1) of the Act, 21 U.S.C. section 360bbb-3(b)(1), unless the authorization is terminated or revoked  sooner.  Performed at [facility], [place] [address] [place], [place] [address]          Radiology Studies: CT Head Wo Contrast  Result Date: 04/10/2020 CLINICAL DATA:  Seizure, altered level of consciousness EXAM: CT HEAD WITHOUT CONTRAST TECHNIQUE: Contiguous axial images were obtained from the base of the skull through the vertex without intravenous contrast. COMPARISON:  03/27/2020 FINDINGS: Brain: Evaluation is limited due to patient motion during the exam. No signs of acute infarct or hemorrhage. Lateral ventricles and midline structures are stable. No acute extra-axial fluid collections. No mass effect. Vascular: No hyperdense vessel or unexpected calcification. Skull: Normal. Negative for fracture or focal lesion. Sinuses/Orbits: There is mucosal thickening within the bilateral maxillary sinuses and left frontal sinus, grossly stable. Other: None. IMPRESSION: 1. No acute intracranial process on this exam limited by excessive patient motion. No change since previous study. Electronically Signed   By: Randa Ngo M.D.   On: 04/10/2020 21:59        Scheduled Meds: . amLODipine  10 mg Oral Daily  . chlorthalidone  25 mg Oral Daily  . cloNIDine  0.2 mg Oral BID  . clopidogrel  75 mg Oral Daily  . furosemide  40 mg Oral Daily  . heparin  5,000 Units Subcutaneous Q8H  . hydrALAZINE  10 mg Intravenous Once  . hydrALAZINE  25 mg Oral Q8H  . pantoprazole  40 mg Oral Daily  . pravastatin  80 mg Oral QHS  . tamsulosin  0.4 mg Oral Daily   Continuous Infusions: . levETIRAcetam Stopped (04/11/20 0131)  . nitroGLYCERIN 30 mcg/min (04/11/20 0653)     LOS: 1 day     Time spent: 39mins I have personally reviewed and interpreted on  04/11/2020 daily labs, tele strips, imagings as discussed above under date review session and assessment and plans.  I reviewed all nursing notes, pharmacy notes, consultant notes,  vitals, pertinent old records  I have discussed plan of care  as described above with RN , patient on 04/11/2020  Voice Recognition /Dragon dictation system was used to create this note, attempts have been made to correct errors. Please contact the author with questions and/or clarifications.   Florencia Reasons, MD PhD FACP [facility]  Available via Epic secure chat 7am-7pm for nonurgent issues Please page for urgent issues To page the attending provider between 7A-7P or the covering provider during after hours 7P-7A, please log into the web site [email or web address] and access using universal [place] password for that web site. If you do not have the password, please call the hospital operator.    04/11/2020, 8:12 AM

## 2020-04-11 NOTE — Evaluation (Signed)
Clinical/Bedside Swallow Evaluation Patient Details  Name: Richard [name] MRN: [id number] Date of Birth: September 23, 1955  Today's Date: 04/11/2020 Time: SLP Start Time (ACUTE ONLY): 51 SLP Stop Time (ACUTE ONLY): 1642 SLP Time Calculation (min) (ACUTE ONLY): 16 min  Past Medical History:  Past Medical History:  Diagnosis Date  . Arthritis   . Diabetes mellitus, type II, insulin dependent ([place])   . GERD (gastroesophageal reflux disease)   . Hypertension   . Hypertensive emergency 05/21/2012  . Immune deficiency disorder ([place])   . Migraines   . Neuropathy in diabetes ([place])    bilat feet  . Ruptured lumbar disc   . Stroke [facility]) 2013   residual right sided weakness (arm>leg)   Past Surgical History:  Past Surgical History:  Procedure Laterality Date  . KNEE ARTHROSCOPY Bilateral    HPI:  Pt is a 64 y.o. male, with medical history significant of diabetes, osteoarthritis, hypertension, CKD 5, neuropathies, previous CVA with residual right-sided weakness, migraine headaches , patient with extremely poor compliance, he has refused hemodialysis access in the past, who presented to ED via EMS status post seizures witnessed by roommate. In ED patient was postictal, he was loaded with Keppra. CT head negative.    Assessment / Plan / Recommendation Clinical Impression  Pt was seen for bedside swallow evaluation and he denied a history of dysphagia but stated that he typically eats slowly. Oral mechanism exam was [name] and dentition adequate. Pt demonstrated oral holding with puree and mastication was prolonged with regular texture solids. However, length of mastication improved as the evaluation progressed and pt indicated that he likes to savor his food. No signs or symptoms of aspiration were noted. A regular texture diet with thin liquids is recommended at this time and SLP will follow to ensure safety.  SLP Visit Diagnosis: Dysphagia, unspecified (R13.10)    Aspiration Risk  No  limitations    Diet Recommendation Regular;Thin liquid   Liquid Administration via: Cup;Straw Medication Administration: Whole meds with liquid Supervision: Patient able to self feed Compensations: Slow rate;Small sips/bites Postural Changes: Seated upright at 90 degrees    Other  Recommendations Oral Care Recommendations: Oral care BID   Follow up Recommendations None      Frequency and Duration min 1 x/week  2 weeks       Prognosis Prognosis for Safe Diet Advancement: Good      Swallow Study   General Date of Onset: 04/10/20 HPI: Pt is a 64 y.o. male, with medical history significant of diabetes, osteoarthritis, hypertension, CKD 5, neuropathies, previous CVA with residual right-sided weakness, migraine headaches , patient with extremely poor compliance, he has refused hemodialysis access in the past, who presented to ED via EMS status post seizures witnessed by roommate. In ED patient was postictal, he was loaded with Keppra. CT head negative.  Type of Study: Bedside Swallow Evaluation Previous Swallow Assessment: None Diet Prior to this Study: NPO Temperature Spikes Noted: No Respiratory Status: Room air History of Recent Intubation: No Behavior/Cognition: Alert;Cooperative;Pleasant mood Oral Cavity Assessment: Within Functional Limits Oral Care Completed by SLP: No Oral Cavity - Dentition: Adequate natural dentition Vision: Functional for self-feeding Self-Feeding Abilities: Able to feed self Patient Positioning: Upright in bed Baseline Vocal Quality: Normal Volitional Cough: Strong Volitional Swallow: Able to elicit    Oral/Motor/Sensory Function Overall Oral Motor/Sensory Function: Within functional limits   Ice Chips Ice chips: Within functional limits Presentation: Spoon   Thin Liquid Thin Liquid: Within functional limits Presentation: Straw  Nectar Thick Nectar Thick Liquid: Not tested   Honey Thick Honey Thick Liquid: Not tested   Puree Puree:  Impaired Presentation: Spoon Oral Phase Functional Implications: Oral holding   Solid     Solid: Impaired Presentation: Self Fed Oral Phase Impairments: Impaired mastication     Richard [name] I. Hardin Negus, [place], [place] Office number [phone] Pager [phone]  Richard [name] 04/11/2020,5:46 PM

## 2020-04-11 NOTE — ED Notes (Signed)
Attempted report 

## 2020-04-11 NOTE — ED Notes (Signed)
Pt failed swallow screen. Pt violently coughed during water sip. Provider advised.

## 2020-04-11 NOTE — ED Notes (Signed)
Pt refusing again to participate in neurological exam. Pt stated he will not answer my questions or perform tasks of neuro exam.

## 2020-04-11 NOTE — ED Notes (Signed)
Changed pt sheets and repositioned pt in bed

## 2020-04-11 NOTE — ED Notes (Signed)
Requested IV meds from MD.

## 2020-04-11 NOTE — ED Notes (Signed)
Attempted neurologic assessment. Pt refused to answer orientation questions. He stated he knew all the answers but would not provide them. Pt also refused to participate in physical exam.

## 2020-04-11 NOTE — ED Notes (Signed)
In/Out cath pt. Put out 273ml. Sample sent to lab

## 2020-04-11 NOTE — ED Notes (Signed)
MD notified that nitro restarted.

## 2020-04-11 NOTE — ED Notes (Signed)
Lab will add on UDS

## 2020-04-11 NOTE — ED Notes (Signed)
MD aware of nitro being stopped for bp of 138/80 and fact that pt is taking oral meds now.

## 2020-04-12 ENCOUNTER — Inpatient Hospital Stay (HOSPITAL_COMMUNITY): Payer: Medicare HMO

## 2020-04-12 DIAGNOSIS — I6930 Unspecified sequelae of cerebral infarction: Secondary | ICD-10-CM | POA: Diagnosis not present

## 2020-04-12 DIAGNOSIS — R569 Unspecified convulsions: Secondary | ICD-10-CM | POA: Diagnosis not present

## 2020-04-12 DIAGNOSIS — E119 Type 2 diabetes mellitus without complications: Secondary | ICD-10-CM | POA: Diagnosis not present

## 2020-04-12 DIAGNOSIS — N185 Chronic kidney disease, stage 5: Secondary | ICD-10-CM

## 2020-04-12 LAB — CBC WITH DIFFERENTIAL/PLATELET
Abs Immature Granulocytes: 0.02 10*3/uL (ref 0.00–0.07)
Basophils Absolute: 0 10*3/uL (ref 0.0–0.1)
Basophils Relative: 0 %
Eosinophils Absolute: 0.1 10*3/uL (ref 0.0–0.5)
Eosinophils Relative: 2 %
HCT: 24.5 % — ABNORMAL LOW (ref 39.0–52.0)
Hemoglobin: 8.2 g/dL — ABNORMAL LOW (ref 13.0–17.0)
Immature Granulocytes: 0 %
Lymphocytes Relative: 12 %
Lymphs Abs: 0.8 10*3/uL (ref 0.7–4.0)
MCH: 29.5 pg (ref 26.0–34.0)
MCHC: 33.5 g/dL (ref 30.0–36.0)
MCV: 88.1 fL (ref 80.0–100.0)
Monocytes Absolute: 0.6 10*3/uL (ref 0.1–1.0)
Monocytes Relative: 9 %
Neutro Abs: 5 10*3/uL (ref 1.7–7.7)
Neutrophils Relative %: 77 %
Platelets: 173 10*3/uL (ref 150–400)
RBC: 2.78 MIL/uL — ABNORMAL LOW (ref 4.22–5.81)
RDW: 14.4 % (ref 11.5–15.5)
WBC: 6.5 10*3/uL (ref 4.0–10.5)
nRBC: 0 % (ref 0.0–0.2)

## 2020-04-12 LAB — BLOOD GAS, ARTERIAL
Acid-base deficit: 12 mmol/L — ABNORMAL HIGH (ref 0.0–2.0)
Bicarbonate: 12.7 mmol/L — ABNORMAL LOW (ref 20.0–28.0)
Drawn by: 331761
FIO2: 21
O2 Saturation: 97.3 %
Patient temperature: 36.5
pCO2 arterial: 23.7 mmHg — ABNORMAL LOW (ref 32.0–48.0)
pH, Arterial: 7.346 — ABNORMAL LOW (ref 7.350–7.450)
pO2, Arterial: 105 mmHg (ref 83.0–108.0)

## 2020-04-12 LAB — HIV-1/2 AB - DIFFERENTIATION
HIV 1 Ab: POSITIVE — AB
HIV 2 Ab: NEGATIVE

## 2020-04-12 LAB — BASIC METABOLIC PANEL
Anion gap: 14 (ref 5–15)
BUN: 86 mg/dL — ABNORMAL HIGH (ref 8–23)
CO2: 13 mmol/L — ABNORMAL LOW (ref 22–32)
Calcium: 7.5 mg/dL — ABNORMAL LOW (ref 8.9–10.3)
Chloride: 116 mmol/L — ABNORMAL HIGH (ref 98–111)
Creatinine, Ser: 10.66 mg/dL — ABNORMAL HIGH (ref 0.61–1.24)
GFR calc Af Amer: 5 mL/min — ABNORMAL LOW (ref >60)
GFR calc non Af Amer: 5 mL/min — ABNORMAL LOW (ref >60)
Glucose, Bld: 88 mg/dL (ref 70–99)
Potassium: 4.4 mmol/L (ref 3.5–5.1)
Sodium: 143 mmol/L (ref 135–145)

## 2020-04-12 LAB — GLUCOSE, CAPILLARY: Glucose-Capillary: 83 mg/dL (ref 70–99)

## 2020-04-12 MED ORDER — LEVETIRACETAM IN NACL 1500 MG/100ML IV SOLN
1500.0000 mg | INTRAVENOUS | Status: DC
  Filled 2020-04-12: qty 100

## 2020-04-12 MED ORDER — LORAZEPAM 2 MG/ML IJ SOLN
INTRAMUSCULAR | Status: AC
  Administered 2020-04-12: 1 mg via INTRAVENOUS
  Filled 2020-04-12: qty 1

## 2020-04-12 MED ORDER — CLOPIDOGREL BISULFATE 75 MG PO TABS
75.0000 mg | ORAL_TABLET | Freq: Every day | ORAL | Status: DC
  Administered 2020-04-14: 75 mg via ORAL
  Filled 2020-04-12: qty 1

## 2020-04-12 MED ORDER — CHLORHEXIDINE GLUCONATE CLOTH 2 % EX PADS
6.0000 | MEDICATED_PAD | Freq: Every day | CUTANEOUS | Status: DC
  Administered 2020-04-13 – 2020-04-25 (×13): 6 via TOPICAL

## 2020-04-12 MED ORDER — LEVETIRACETAM IN NACL 1500 MG/100ML IV SOLN
1500.0000 mg | INTRAVENOUS | Status: AC
  Administered 2020-04-12: 1500 mg via INTRAVENOUS
  Filled 2020-04-12: qty 100

## 2020-04-12 MED ORDER — CEFAZOLIN SODIUM-DEXTROSE 2-4 GM/100ML-% IV SOLN
2.0000 g | INTRAVENOUS | Status: AC
  Administered 2020-04-12: 2 g via INTRAVENOUS
  Filled 2020-04-12: qty 100

## 2020-04-12 MED ORDER — LEVETIRACETAM 500 MG PO TABS: 500.0000 mg | ORAL_TABLET | Freq: Two times a day (BID) | ORAL | Status: DC

## 2020-04-12 MED ORDER — LEVETIRACETAM IN NACL 500 MG/100ML IV SOLN
500.0000 mg | Freq: Two times a day (BID) | INTRAVENOUS | Status: DC
  Administered 2020-04-12 – 2020-04-15 (×6): 500 mg via INTRAVENOUS
  Filled 2020-04-12 (×6): qty 100

## 2020-04-12 MED ORDER — LORAZEPAM 2 MG/ML IJ SOLN
2.0000 mg | Freq: Once | INTRAMUSCULAR | Status: DC
  Filled 2020-04-12: qty 1

## 2020-04-12 MED ORDER — SODIUM CHLORIDE 0.9 % IV SOLN
200.0000 mg | Freq: Once | INTRAVENOUS | Status: AC
  Administered 2020-04-12: 200 mg via INTRAVENOUS
  Filled 2020-04-12: qty 20

## 2020-04-12 MED ORDER — HEPARIN SODIUM (PORCINE) 5000 UNIT/ML IJ SOLN
5000.0000 [IU] | Freq: Three times a day (TID) | INTRAMUSCULAR | Status: DC
  Administered 2020-04-14 (×2): 5000 [IU] via SUBCUTANEOUS
  Filled 2020-04-12 (×2): qty 1

## 2020-04-12 MED ORDER — SODIUM CHLORIDE 0.9 % IV SOLN
100.0000 mg | Freq: Two times a day (BID) | INTRAVENOUS | Status: DC
  Administered 2020-04-12: 100 mg via INTRAVENOUS
  Filled 2020-04-12 (×3): qty 10

## 2020-04-12 MED ORDER — SODIUM CHLORIDE 0.9 % IV SOLN: 20.0000 mg/kg | Freq: Once | INTRAVENOUS | Status: DC

## 2020-04-12 MED ORDER — LORAZEPAM 2 MG/ML IJ SOLN
INTRAMUSCULAR | Status: AC
  Administered 2020-04-12: 1 mg
  Filled 2020-04-12: qty 1

## 2020-04-12 NOTE — Progress Notes (Signed)
Family has been reached concerning updates for the pt and answering questions. Daughter did answer and stated that she would call later on for another update.

## 2020-04-12 NOTE — Progress Notes (Signed)
°  Speech Language Pathology Treatment: Dysphagia  Patient Details Name: Richard [name] MRN: [id number] DOB: 09/17/1956 Today's Date: 04/12/2020 Time: 9563-8756 SLP Time Calculation (min) (ACUTE ONLY): 15 min  Assessment / Plan / Recommendation Clinical Impression  Richard [name] was seen for therapeutic diet tolerance. Unfortunately, pt demonstrates a huge change in status compared to yesterday's evaluation. Pt presenting with strong R gaze preference, unable to track to clinician despite max cues. He had no true words, only grunting noted. Pt kept eyes open t/o this session, but was unreponsive otherwise. He was attempted with breakfast tray of pancake bite and orange juice. Pt unable to open mouth despite verbal and tactile cues. With straw placed between his lips, he was unable to draw juice from straw and stared blankly R. Yesterday, pt was talking and eating without significant difficulty. Concerns reported to both RN and MD (Richard [name]). Pt to be made NPO pending ability to participate in PO intake. ST service to follow closely.     HPI HPI: Pt is a 64 y.o. male, with medical history significant of diabetes, osteoarthritis, hypertension, CKD 5, neuropathies, previous CVA with residual right-sided weakness, migraine headaches , patient with extremely poor compliance, he has refused hemodialysis access in the past, who presented to ED via EMS status post seizures witnessed by roommate. In ED patient was postictal, he was loaded with Keppra. CT head negative. BSE 02/14/20: Pt had a little throat clearing initially while drinking thin liquids in a suboptimal position, but once repositioned more fully upright, no further s/s of aspiration were noted. 04/11/20 pt without s/s aspiration; placed on regular/thin. 7/22 change in status; MD paged. Made NPO. To undergo further testing.      SLP Plan  Continue with current plan of care       Recommendations  Diet recommendations: NPO Medication  Administration: Whole meds with liquid                Oral Care Recommendations: Oral care BID Follow up Recommendations: None SLP Visit Diagnosis: Dysphagia, unspecified (R13.10) Plan: Continue with current plan of care                     Leonna Schlee P. Nadelyn Enriques, M.S., CCC-SLP Speech-Language Pathologist Acute Rehabilitation Services Pager: [place] 04/12/2020, 9:43 AM

## 2020-04-12 NOTE — Progress Notes (Signed)
STAT EEG complete - results pending. Neuro notified.

## 2020-04-12 NOTE — Progress Notes (Signed)
Pt had notable difference compared to night shift presentation. Rt gaze present and was grunting for response instead of words. Pt unable to follow commands. MD notified.

## 2020-04-12 NOTE — Significant Event (Signed)
Rapid Response Event Note  Overview: Time Called: 1001 Arrival Time: 1004 Event Type: Neurologic  Responded to Code Stroke call Per Rn patient with snoring respirations and right gaze since 0700 this am.  Per documentation He was alert and following commands at 0400  Initial Focused Assessment: Patient unresponsive with snoring respirations and right gaze deviation. Right arm contracted He will occasionally cough spontaneously BP 150/87 ST 120  RR 25  O2 sat 97% on RA On Nitroglycerine gtt at 6cc/hr  Unresponsive to noxious stimuli  Interventions: 2mg  Ativan given IV Stat head CT done Stat MRI done NTG gtt stopped 1500mg  keppra given IV 200mg  Vimpat given IV  BP 178/92  ST 107  RR 28  O2 sat 100% on RA He will cough with noxious stimuli in nasal passage but he does not reach up with UE.  He will withdraw to painful stimuli.  Eyes deviation to right. Post Vimpat infusion.  RN notifying neurohospitalist.  EEG in process  1310 Patient able to open eyes to painful stimuli.  Has right gaze preference able to come to midline   164/78  ST 102  RR 22  O2 sat 96% on RA    Plan of Care (if not transferred): Long term EEG RN to call if patient becomes unresponsive or if concern for airway protection  Event Summary: Name of Physician Notified: Dr Florencia Reasons at bedside at    Name of Consulting Physician Notified: Dr Aroor/ Sal at 1003     Event End Time: 1335  Raliegh Ip

## 2020-04-12 NOTE — Progress Notes (Signed)
MD advised to call code stroke and RR nurse notified of pt's change. BP:151/88 HR:118 RR: 23 T:98.3 PulseOx: 100%

## 2020-04-12 NOTE — Progress Notes (Addendum)
PROGRESS NOTE    Richard [name]  JJK:[id number] DOB: 1956-02-04 DOA: 04/10/2020 PCP: [name], MD    Chief Complaint  Patient presents with   Seizures    Brief Narrative:  Richard [name]  is a 64 y.o. male, with medical history significant ofdiabetes, osteoarthritis, hypertension,CKD 5,neuropathies, previous CVA with residual right-sided weakness, migraine headaches , patient with extremely poor compliance, he has refused hemodialysis access in the past, presents to ED via EMS status post seizures, he is known history of seizures, but has not been noncompliant with his seizure medications, seizures was witnessed by his roommate, seizures lasted for 4 minutes, she will patient's extremely encephalopathic, unable to provide any history, but upon my evaluation is more appropriate, patient report actually he never got his seizure medications, was not taking it, patient denies any fever, chest pain, shortness of breath, no dysuria or polyuria, reports usually ambulates with a walker, he has right-sided weakness at baseline. -in ED patient was postictal, he was loaded with Keppra, ED discussed with neurology who recommended to resume his home dose Keppra 500 mg p.o. twice daily, as well patient was noted to be hypertensive 230/118, patient received IV labetalol, IV hydralazine, as well patient with significant labs abnormalities which appears to be at baseline, including creatinine of 9.98, BUN of 76, baseline anemia with hemoglobin of 10.3, hospitalist were requested to admit  Subjective:  Patient had acute neurologic change this morning, with right gaze, he is maintaining airway, no jerking movement  Assessment & Plan:   Active Problems:   Tobacco abuse   Protein-calorie malnutrition, mild (HCC)   Hyperlipidemia   Neuropathy in diabetes (HCC)   GERD (gastroesophageal reflux disease)   Diabetes mellitus, type II, insulin dependent (HCC)   CKD (chronic kidney disease)  stage 5, GFR less than 15 ml/min (HCC)   History of cerebrovascular accident (CVA) with residual deficit   Seizure ([place])   Acute metabolic encephalopathy -Initial CT scan on admission no acute findings -Multifactorial, mentation appears has improved on 7/11 pm, however has acute mental status change on July 22 in a.m.,  -EEG ordered to rule out seizure -Code stroke initiated, stat CT scan still no acute findings, stat MRI showed "2 mm acute or early subacute infarct within the left parietal lobe periatrial white matter" -neurology consulted, will follow recommendation   Recurrent seizures, present on admission -This is due to medication noncompliance, he was loaded with 1 g of Keppra in ED, change oral keppra to iv now patient has acute mental status changes -We will follow EEG result and neurology recommendation  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 -Continue with Plavix and statin   Hypertensive urgency, present on admission, likely due to medication noncompliance -Blood pressure significantly elevated in ED with systolic blood pressure 371, this is secondary to noncompliance with medications,  -He was seen post ictal state , not able to tolerate oral meds consistently , he was started on nitro drip on admission  -hold his home dose Norvasc, hydralazine and chlorthalidone for now -On July 22, concerning for acute stroke, code nitro drip for now  CKD stage V/uremia/anemia of chronic disease -Creatinine in 10-11 range, creatinine appears to be at baseline . -His BUN is 76 -No edema -Patient has refused dialysis in the past,  he is agreeable to dialysis on presentation -Nephrology consulted.  HLD -Resume home meds   Insulin-dependent DM2, controlled -Is on Novolin 70/30, will start on low-dose Lantus and insulin sliding  scale  Tobacco use -Counseled to quit using this.   history of severe noncompliance -He  was counseled.    DVT prophylaxis: heparin injection 5,000 Units Start: 04/10/20 2345 SCDs Start: 04/10/20 2332   Code Status: Full Family Communication:  Disposition:   Status is: Inpatient     Dispo: The patient is from: Home, he lives with a roommate              Anticipated d/c is to: To be determined              Anticipated d/c date is: To be determined              Patient currently having acute stroke, needs to start dialysis  Consultants:  nephrology  Neurology  Procedures:   None  Antimicrobials:   None     Objective: Vitals:   04/12/20 0400 04/12/20 0430 04/12/20 0500 04/12/20 0753  BP: 137/78 134/74 (!) 158/82 131/81  Pulse:    (!) 110  Resp: 19 20 20 19   Temp:    99.2 F (37.3 C)  TempSrc:    Oral  SpO2:    99%    Intake/Output Summary (Last 24 hours) at 04/12/2020 1016 Last data filed at 04/12/2020 2376 Gross per 24 hour  Intake 618.23 ml  Output 400 ml  Net 218.23 ml   There were no vitals filed for this visit.  Examination:  General exam: Open eyes, right gaze, maintaining airway spontaneously Respiratory system: Clear to auscultation. Respiratory effort normal. Cardiovascular system: S1 & S2 heard, RRR. No JVD, no murmur, No pedal edema. Gastrointestinal system: Abdomen is nondistended, soft and nontender. No organomegaly or masses felt. Normal bowel sounds heard. Central nervous system: ,Open eyes, right gaze, chronic right upper extremity contracture Extremities: Right hemiparesis with right upper extremity contracture Skin: No rashes, lesions or ulcers Psychiatry: not able to access     Data Reviewed: I have personally reviewed following labs and imaging studies  CBC: Recent Labs  Lab 04/10/20 2037 04/11/20 0018 04/11/20 0402 04/12/20 0434  WBC 6.6 6.8 9.1 6.5  NEUTROABS 4.7  --   --  5.0  HGB 10.3* 11.4* 9.7* 8.2*  HCT 31.5* 34.5* 29.4* 24.5*  MCV 90.0 87.6 89.4 88.1  PLT 192 201 195 283    Basic Metabolic  Panel: Recent Labs  Lab 04/10/20 2037 04/11/20 0018 04/11/20 0402 04/12/20 0434  NA 143  --  145 143  K 4.2  --  4.4 4.4  CL 115*  --  117* 116*  CO2 13*  --  11* 13*  GLUCOSE 106*  --  94 88  BUN 76*  --  78* 86*  CREATININE 9.98* 9.99* 9.99* 10.66*  CALCIUM 7.5*  --  7.6* 7.5*    GFR: CrCl cannot be calculated (Unknown ideal weight.).  Liver Function Tests: Recent Labs  Lab 04/10/20 2037  AST 13*  ALT 10  ALKPHOS 95  BILITOT 0.5  PROT 6.6  ALBUMIN 2.8*    CBG: Recent Labs  Lab 04/10/20 2031 04/11/20 1620 04/12/20 1006  GLUCAP 91 115* 83     Recent Results (from the past 240 hour(s))  SARS Coronavirus 2 by RT PCR (hospital order, performed in [facility] hospital lab) Nasopharyngeal Nasopharyngeal Swab     Status: None   Collection Time: 04/11/20  3:58 AM   Specimen: Nasopharyngeal Swab  Result Value Ref Range Status   SARS Coronavirus 2 NEGATIVE NEGATIVE Final    Comment: (NOTE) SARS-CoV-2  target nucleic acids are NOT DETECTED.  The SARS-CoV-2 RNA is generally detectable in upper and lower respiratory specimens during the acute phase of infection. The lowest concentration of SARS-CoV-2 viral copies this assay can detect is 250 copies / mL. A negative result does not preclude SARS-CoV-2 infection and should not be used as the sole basis for treatment or other patient management decisions.  A negative result may occur with improper specimen collection / handling, submission of specimen other than nasopharyngeal swab, presence of viral mutation(s) within the areas targeted by this assay, and inadequate number of viral copies (<250 copies / mL). A negative result must be combined with clinical observations, patient history, and epidemiological information.  Fact Sheet for Patients:   StrictlyIdeas.no  Fact Sheet for Healthcare Providers: BankingDealers.co.za  This test is not yet approved or  cleared by  the [place] FDA and has been authorized for detection and/or diagnosis of SARS-CoV-2 by FDA under an Emergency Use Authorization (EUA).  This EUA will remain in effect (meaning this test can be used) for the duration of the COVID-19 declaration under Section 564(b)(1) of the Act, 21 U.S.C. section 360bbb-3(b)(1), unless the authorization is terminated or revoked sooner.  Performed at [facility], [place] [address] [place], [place] [address]          Radiology Studies: CT Head Wo Contrast  Result Date: 04/10/2020 CLINICAL DATA:  Seizure, altered level of consciousness EXAM: CT HEAD WITHOUT CONTRAST TECHNIQUE: Contiguous axial images were obtained from the base of the skull through the vertex without intravenous contrast. COMPARISON:  03/27/2020 FINDINGS: Brain: Evaluation is limited due to patient motion during the exam. No signs of acute infarct or hemorrhage. Lateral ventricles and midline structures are stable. No acute extra-axial fluid collections. No mass effect. Vascular: No hyperdense vessel or unexpected calcification. Skull: Normal. Negative for fracture or focal lesion. Sinuses/Orbits: There is mucosal thickening within the bilateral maxillary sinuses and left frontal sinus, grossly stable. Other: None. IMPRESSION: 1. No acute intracranial process on this exam limited by excessive patient motion. No change since previous study. Electronically Signed   By: Randa Ngo M.D.   On: 04/10/2020 21:59   US RENAL  Result Date: 04/11/2020 CLINICAL DATA:  Elevated creatinine. EXAM: RENAL / URINARY TRACT ULTRASOUND COMPLETE COMPARISON:  06/24/2019 and CT abdomen pelvis 09/03/2017 FINDINGS: Right Kidney: Renal measurements: 10.0 x 5.4 x 3.3 centimeters = volume: 93.1 mL. Renal parenchyma is heterogeneous and echogenic. Cyst previously identified in the UPPER pole region RIGHT kidney is not well seen. Suspect small parapelvic cyst. Left Kidney: Renal measurements: 9.2 x 4.2 x 3.5  centimeters = volume: 10.5 mL. Renal parenchyma is heterogeneous and echogenic. Bladder: Appears normal for degree of bladder distention. Other: None. IMPRESSION: No hydronephrosis. Heterogeneous echogenic renal parenchyma bilaterally. Electronically Signed   By: Nolon Nations M.D.   On: 04/11/2020 18:33        Scheduled Meds:  amLODipine  10 mg Oral Daily   chlorthalidone  25 mg Oral Daily   cloNIDine  0.2 mg Oral BID   clopidogrel  75 mg Oral Daily   furosemide  40 mg Oral Daily   heparin  5,000 Units Subcutaneous Q8H   hydrALAZINE  10 mg Intravenous Once   hydrALAZINE  25 mg Oral Q8H   levETIRAcetam  500 mg Oral BID   LORazepam       LORazepam  2 mg Intravenous Once   pantoprazole  40 mg Oral Daily   pravastatin  80 mg Oral QHS   tamsulosin  0.4 mg Oral Daily   Continuous Infusions:  levETIRAcetam     levETIRAcetam Stopped (04/11/20 2153)   nitroGLYCERIN 20 mcg/min (04/11/20 1519)     LOS: 2 days     Time spent: 42mins I have personally reviewed and interpreted on  04/12/2020 daily labs, tele strips, imagings as discussed above under date review session and assessment and plans.  I reviewed all nursing notes, pharmacy notes, consultant notes,  vitals, pertinent old records  I have discussed plan of care as described above with RN , patient on 04/12/2020  Voice Recognition /Dragon dictation system was used to create this note, attempts have been made to correct errors. Please contact the author with questions and/or clarifications.   Florencia Reasons, MD PhD FACP [facility]  Available via Epic secure chat 7am-7pm for nonurgent issues Please page for urgent issues To page the attending provider between 7A-7P or the covering provider during after hours 7P-7A, please log into the web site [email or web address] and access using universal [place] password for that web site. If you do not have the password, please call the hospital operator.    04/12/2020,  10:16 AM

## 2020-04-12 NOTE — Consult Note (Addendum)
[facility] Nephrology Consultation Note  Requesting MD: Dr Florencia Reasons Reason for consult: CKD5 management  HPI:  Richard [name] is a 64 y.o. male with history of type 2 diabetes since 1990s, retinopathy hypertension, stroke with right-sided weakness, seizure disorder, HLD, ED, CKD stage V with poor compliance with follow-ups and medication, anemia, secondary hyperparathyroidism, admitted with seizure activity and encephalopathy, seen as a consultation for the management of CKD. He was seen at [place] [facility] by me, last seen on 10/04/2019 when his creatinine level was 6.74.  The CKD was thought to be due to combination of poorly controlled diabetes and hypertensive nephrosclerosis.  He was advised to get permanent access in preparation for dialysis however patient declined.  He missed multiple clinic appointments. Reportedly, he was not taking his medications including seizure medication.  He had a witnessed seizure by his roommate lasted for about 4 minutes.  He became confused and encephalopathic.  In the ER he was in postictal phase.  Neurology was consultedand loaded with Keppra.  Initially his blood pressure was >200 for which he received IV antihypertensives.  He was on room air.   The lab consistent creatinine level of 10.6, BUN 86, CO2 13, potassium 4.4, hemoglobin 8.2 which is dropped from 10.3 from admission. He is currently on coninuous EEG monitoring.  Quite sedated with Ativan and Keppra.  Neurology team is also at the bedside.  Patient was trying to open eyes with the name however not following commands.  Unable to obtain review of system from the patient. I called patient's daughter to discuss about his current condition including poor kidney function.  She agreed to initiate dialysis and management of CKD.  Creatinine, Ser  Date/Time Value Ref Range Status  04/12/2020 04:34 AM 10.66 (H) 0.61 - 1.24 mg/dL Final  04/11/2020 04:02 AM 9.99 (H) 0.61 - 1.24 mg/dL  Final  04/11/2020 12:18 AM 9.99 (H) 0.61 - 1.24 mg/dL Final  04/10/2020 08:37 PM 9.98 (H) 0.61 - 1.24 mg/dL Final  02/15/2020 01:51 AM 10.21 (H) 0.61 - 1.24 mg/dL Final  02/14/2020 09:34 AM 10.98 (H) 0.61 - 1.24 mg/dL Final  02/13/2020 05:37 PM 11.40 (H) 0.61 - 1.24 mg/dL Final  02/13/2020 05:21 PM 10.90 (H) 0.61 - 1.24 mg/dL Final  09/14/2019 03:58 PM 6.34 (HH) 0.40 - 1.50 mg/dL Final  06/22/2019 03:05 PM 6.09 (HH) 0.40 - 1.50 mg/dL Final    PMHx:   Past Medical History:  Diagnosis Date   Arthritis    Diabetes mellitus, type II, insulin dependent ([place])    GERD (gastroesophageal reflux disease)    Hypertension    Hypertensive emergency 05/21/2012   Immune deficiency disorder ([place])    Migraines    Neuropathy in diabetes ([place])    bilat feet   Ruptured lumbar disc    Stroke ([place]) 2013   residual right sided weakness (arm>leg)    Past Surgical History:  Procedure Laterality Date   KNEE ARTHROSCOPY Bilateral     Family Hx:  Family History  Problem Relation Age of Onset   Other Mother    Stroke Father    Hypertension Father    Epilepsy Father    Breast cancer Sister    Stroke Brother    Heart attack Brother    Diabetes Sister     Social History:  reports that he has been smoking cigarettes. He has been smoking about 0.50 packs per day. He has never used smokeless tobacco. He reports current drug use. Drug: Marijuana. He  reports that he does not drink alcohol.  Allergies: No Known Allergies  Medications: Prior to Admission medications   Medication Sig Start Date End Date Taking? Authorizing Provider  amLODipine (NORVASC) 10 MG tablet TAKE 1 TABLET BY MOUTH DAILY Patient taking differently: Take 10 mg by mouth daily.  03/28/20  Yes Koberlein, Junell C, MD  calcitRIOL (ROCALTROL) 0.25 MCG capsule Take 1 capsule (0.25 mcg total) by mouth daily. 03/07/20  Yes Koberlein, [name], MD  clopidogrel (PLAVIX) 75 MG tablet Take 1 tablet (75 mg total) by mouth  daily. 03/07/20  Yes Koberlein, Junell C, MD  furosemide (LASIX) 40 MG tablet Take 1 tablet (40 mg total) by mouth daily. 03/07/20  Yes Koberlein, [name], MD  hydrALAZINE (APRESOLINE) 25 MG tablet Take 1 tablet (25 mg total) by mouth every 8 (eight) hours. 03/07/20  Yes Koberlein, Junell C, MD  hydrocortisone cream 1 % Apply 1 application topically 4 (four) times daily as needed for itching.   Yes [provider]  pantoprazole (PROTONIX) 40 MG tablet TAKE 1 TABLET BY MOUTH EVERY DAY Patient taking differently: Take 40 mg by mouth daily.  01/23/20  Yes Koberlein, Junell C, MD  tamsulosin (FLOMAX) 0.4 MG CAPS capsule TAKE 1 CAPSULE BY MOUTH EVERY DAY Patient taking differently: Take 0.4 mg by mouth daily.  03/28/20  Yes Koberlein, [name], MD  ACCU-CHEK AVIVA PLUS test strip 1 each by Other route See admin instructions. Use 1 test strip to test blood sugar two-three times daily 04/20/18   [provider]  ACCU-CHEK AVIVA PLUS test strip USE 1 strip TO test blood sugar 2 TIMES DAILY TO 3 TIMES DAILY Patient taking differently: 1 each by Other route in the morning, at noon, and at bedtime.  06/10/19   [name], MD  blood glucose meter kit and supplies KIT Dispense based on patient and insurance preference. Check blood sugar 4 times a day Patient taking differently: Inject 1 each into the skin See admin instructions. Dispense based on patient and insurance preference. Check blood sugar 4 times a day 06/11/18   [name], MD  chlorthalidone (HYGROTON) 25 MG tablet Take 1 tablet (25 mg total) by mouth daily. 03/07/20   [name], MD  Cholecalciferol (VITAMIN D) 50 MCG (2000 UT) tablet Take 1 tablet (2,000 Units total) by mouth in the morning. 03/07/20   [name], MD  cloNIDine (CATAPRES) 0.2 MG tablet Take 1 tablet (0.2 mg total) by mouth 2 (two) times daily. 03/07/20   [name], MD  famotidine (PEPCID) 20 MG tablet Take 1 tablet (20 mg total) by  mouth 2 (two) times daily. 03/07/20   [name], MD  hydrOXYzine (ATARAX/VISTARIL) 25 MG tablet Take 1 tablet (25 mg total) by mouth every 8 (eight) hours as needed for itching. 03/27/20   Tegeler, Gwenyth Allegra, MD  insulin aspart protamine - aspart (NOVOLOG MIX 70/30 FLEXPEN) (70-30) 100 UNIT/ML FlexPen INJECT 28 UNITS INTO THE SKIN EVERY MORNING Patient taking differently: Inject 28 Units into the skin in the morning.  12/01/19   Koberlein, [name], MD  Insulin Pen Needle (SURE COMFORT PEN NEEDLES) 31G X 8 MM MISC Use as directed twice daily with Novolog flex pen Patient taking differently: 1 each by Other route See admin instructions. Use as directed twice daily with Novolog flex pen 02/10/19   Koberlein, [name], MD  levETIRAcetam (KEPPRA) 500 MG tablet Take 1 tablet (500 mg total) by mouth 2 (two)  times daily. 03/07/20 04/06/20  [name], MD  Misc. Devices (COMMODE 3-IN-1) MISC Use as directed 02/24/20   [name], MD  Misc. Devices (TRANSFER BENCH) MISC Use as directed 02/24/20   Koberlein, [name], MD  NARCAN 4 MG/0.1ML LIQD nasal spray kit Place 0.4 mg into the nose once.  05/31/18   [provider]  Oxycodone HCl 20 MG TABS Take 20 mg by mouth every 6 (six) hours as needed (pain).  05/31/18   [provider]  potassium chloride SA (KLOR-CON) 20 MEQ tablet Take 20 mEq by mouth daily.  12/07/19   [provider]  pravastatin (PRAVACHOL) 80 MG tablet Take 1 tablet (80 mg total) by mouth at bedtime. 03/07/20   [name], MD  sildenafil (REVATIO) 20 MG tablet Take 20-100 mg by mouth as needed for erectile dysfunction. 03/28/20   [provider]  TRUEPLUS INSULIN SYRINGE 31G X 5/16" 1 ML MISC Use pen needle with insulin 2 times daily Patient taking differently: 1 each by Other route in the morning and at bedtime. With insulin 03/31/19   [name], MD    I have reviewed the patient's current medications.  Labs:  Results for  orders placed or performed during the hospital encounter of 04/10/20 (from the past 48 hour(s))  CBG monitoring, ED     Status: None   Collection Time: 04/10/20  8:31 PM  Result Value Ref Range   Glucose-Capillary 91 70 - 99 mg/dL    Comment: Glucose reference range applies only to samples taken after fasting for at least 8 hours.  CBC with Differential     Status: Abnormal   Collection Time: 04/10/20  8:37 PM  Result Value Ref Range   WBC 6.6 4.0 - 10.5 K/uL   RBC 3.50 (L) 4.22 - 5.81 MIL/uL   Hemoglobin 10.3 (L) 13.0 - 17.0 g/dL   HCT 31.5 (L) 39 - 52 %   MCV 90.0 80.0 - 100.0 fL   MCH 29.4 26.0 - 34.0 pg   MCHC 32.7 30.0 - 36.0 g/dL   RDW 14.7 11.5 - 15.5 %   Platelets 192 150 - 400 K/uL   nRBC 0.0 0.0 - 0.2 %   Neutrophils Relative % 71 %   Neutro Abs 4.7 1.7 - 7.7 K/uL   Lymphocytes Relative 17 %   Lymphs Abs 1.1 0.7 - 4.0 K/uL   Monocytes Relative 7 %   Monocytes Absolute 0.5 0 - 1 K/uL   Eosinophils Relative 4 %   Eosinophils Absolute 0.3 0 - 0 K/uL   Basophils Relative 0 %   Basophils Absolute 0.0 0 - 0 K/uL   Immature Granulocytes 1 %   Abs Immature Granulocytes 0.03 0.00 - 0.07 K/uL    Comment: Performed at [facility], [address] [place], [place] [address]  Comprehensive metabolic panel     Status: Abnormal   Collection Time: 04/10/20  8:37 PM  Result Value Ref Range   Sodium 143 135 - 145 mmol/L   Potassium 4.2 3.5 - 5.1 mmol/L   Chloride 115 (H) 98 - 111 mmol/L   CO2 13 (L) 22 - 32 mmol/L   Glucose, Bld 106 (H) 70 - 99 mg/dL    Comment: Glucose reference range applies only to samples taken after fasting for at least 8 hours.   BUN 76 (H) 8 - 23 mg/dL   Creatinine, Ser 9.98 (H) 0.61 - 1.24 mg/dL   Calcium 7.5 (  L) 8.9 - 10.3 mg/dL   Total Protein 6.6 6.5 - 8.1 g/dL   Albumin 2.8 (L) 3.5 - 5.0 g/dL   AST 13 (L) 15 - 41 U/L   ALT 10 0 - 44 U/L   Alkaline Phosphatase 95 38 - 126 U/L   Total Bilirubin 0.5 0.3 - 1.2 mg/dL   GFR calc non Af Amer 5  (L) >60 mL/min   GFR calc Af Amer 6 (L) >60 mL/min   Anion gap 15 5 - 15    Comment: Performed at [facility] [address] [place], [place] [address]  HIV Antibody (routine testing w rflx)     Status: Abnormal   Collection Time: 04/11/20 12:18 AM  Result Value Ref Range   HIV Screen 4th Generation wRfx Reactive (A) Non Reactive    Comment: (NOTE) Reactive result does not distinguish HIV-1 p24 antigen, HIV-1  antibody, HIV-2 antibody, and HIV-1 group O antibody. Results  reactive by HIV Antigen/Antibody EIA must be confirmed. Sent for  confirmation. Performed at [facility], [place] [address], [place], [place] [address]   CBC     Status: Abnormal   Collection Time: 04/11/20 12:18 AM  Result Value Ref Range   WBC 6.8 4.0 - 10.5 K/uL   RBC 3.94 (L) 4.22 - 5.81 MIL/uL   Hemoglobin 11.4 (L) 13.0 - 17.0 g/dL   HCT 34.5 (L) 39 - 52 %   MCV 87.6 80.0 - 100.0 fL   MCH 28.9 26.0 - 34.0 pg   MCHC 33.0 30.0 - 36.0 g/dL   RDW 14.4 11.5 - 15.5 %   Platelets 201 150 - 400 K/uL   nRBC 0.0 0.0 - 0.2 %    Comment: Performed at [facility], [place] [address], [place], [place] [address]  Creatinine, serum     Status: Abnormal   Collection Time: 04/11/20 12:18 AM  Result Value Ref Range   Creatinine, Ser 9.99 (H) 0.61 - 1.24 mg/dL   GFR calc non Af Amer 5 (L) >60 mL/min   GFR calc Af Amer 6 (L) >60 mL/min    Comment: Performed at [facility] [address], [place], [place] [address]  Urinalysis, Routine w reflex microscopic     Status: Abnormal   Collection Time: 04/11/20  2:23 AM  Result Value Ref Range   Color, Urine STRAW (A) YELLOW   APPearance CLEAR CLEAR   Specific Gravity, Urine 1.011 1.005 - 1.030   pH 5.0 5.0 - 8.0   Glucose, UA 50 (A) NEGATIVE mg/dL   Hgb urine dipstick SMALL (A) NEGATIVE   Bilirubin Urine NEGATIVE NEGATIVE   Ketones, ur NEGATIVE NEGATIVE mg/dL   Protein, ur >=300 (A) NEGATIVE mg/dL   Nitrite NEGATIVE NEGATIVE   Leukocytes,Ua  NEGATIVE NEGATIVE   RBC / HPF 0-5 0 - 5 RBC/hpf   WBC, UA 0-5 0 - 5 WBC/hpf   Bacteria, UA RARE (A) NONE SEEN    Comment: Performed at [facility] [address], [place], [place] [address]  SARS Coronavirus 2 by RT PCR (hospital order, performed in [facility] hospital lab) Nasopharyngeal Nasopharyngeal Swab     Status: None   Collection Time: 04/11/20  3:58 AM   Specimen: Nasopharyngeal Swab  Result Value Ref Range   SARS Coronavirus 2 NEGATIVE NEGATIVE    Comment: (NOTE) SARS-CoV-2 target nucleic acids are NOT DETECTED.  The SARS-CoV-2 RNA is generally detectable in upper and lower respiratory specimens  during the acute phase of infection. The lowest concentration of SARS-CoV-2 viral copies this assay can detect is 250 copies / mL. A negative result does not preclude SARS-CoV-2 infection and should not be used as the sole basis for treatment or other patient management decisions.  A negative result may occur with improper specimen collection / handling, submission of specimen other than nasopharyngeal swab, presence of viral mutation(s) within the areas targeted by this assay, and inadequate number of viral copies (<250 copies / mL). A negative result must be combined with clinical observations, patient history, and epidemiological information.  Fact Sheet for Patients:   StrictlyIdeas.no  Fact Sheet for Healthcare Providers: BankingDealers.co.za  This test is not yet approved or  cleared by the [place] FDA and has been authorized for detection and/or diagnosis of SARS-CoV-2 by FDA under an Emergency Use Authorization (EUA).  This EUA will remain in effect (meaning this test can be used) for the duration of the COVID-19 declaration under Section 564(b)(1) of the Act, 21 U.S.C. section 360bbb-3(b)(1), unless the authorization is terminated or revoked sooner.  Performed at [facility], [place] [address],  [place], [place] [address]   Basic metabolic panel     Status: Abnormal   Collection Time: 04/11/20  4:02 AM  Result Value Ref Range   Sodium 145 135 - 145 mmol/L   Potassium 4.4 3.5 - 5.1 mmol/L   Chloride 117 (H) 98 - 111 mmol/L   CO2 11 (L) 22 - 32 mmol/L   Glucose, Bld 94 70 - 99 mg/dL    Comment: Glucose reference range applies only to samples taken after fasting for at least 8 hours.   BUN 78 (H) 8 - 23 mg/dL   Creatinine, Ser 9.99 (H) 0.61 - 1.24 mg/dL   Calcium 7.6 (L) 8.9 - 10.3 mg/dL   GFR calc non Af Amer 5 (L) >60 mL/min   GFR calc Af Amer 6 (L) >60 mL/min   Anion gap 17 (H) 5 - 15    Comment: Performed at [facility] [address] [name], [place] [address]  CBC     Status: Abnormal   Collection Time: 04/11/20  4:02 AM  Result Value Ref Range   WBC 9.1 4.0 - 10.5 K/uL   RBC 3.29 (L) 4.22 - 5.81 MIL/uL   Hemoglobin 9.7 (L) 13.0 - 17.0 g/dL   HCT 29.4 (L) 39 - 52 %   MCV 89.4 80.0 - 100.0 fL   MCH 29.5 26.0 - 34.0 pg   MCHC 33.0 30.0 - 36.0 g/dL   RDW 14.4 11.5 - 15.5 %   Platelets 195 150 - 400 K/uL   nRBC 0.0 0.0 - 0.2 %    Comment: Performed at [facility], [place] [address], [place], [place] [address]  Urine rapid drug screen (hosp performed)     Status: None   Collection Time: 04/11/20  8:06 AM  Result Value Ref Range   Opiates NONE DETECTED NONE DETECTED   Cocaine NONE DETECTED NONE DETECTED   Benzodiazepines NONE DETECTED NONE DETECTED   Amphetamines NONE DETECTED NONE DETECTED   Tetrahydrocannabinol NONE DETECTED NONE DETECTED   Barbiturates NONE DETECTED NONE DETECTED    Comment: (NOTE) DRUG SCREEN FOR MEDICAL PURPOSES ONLY.  IF CONFIRMATION IS NEEDED FOR ANY PURPOSE, NOTIFY LAB WITHIN 5 DAYS.  LOWEST DETECTABLE LIMITS FOR URINE DRUG SCREEN Drug Class  Cutoff (ng/mL) Amphetamine and metabolites    1000 Barbiturate and metabolites    200 Benzodiazepine                 811 Tricyclics and metabolites     300 Opiates  and metabolites        300 Cocaine and metabolites        300 THC                            50 Performed at [facility], [place] [address] [place], [place] [address]   Glucose, capillary     Status: Abnormal   Collection Time: 04/11/20  4:20 PM  Result Value Ref Range   Glucose-Capillary 115 (H) 70 - 99 mg/dL    Comment: Glucose reference range applies only to samples taken after fasting for at least 8 hours.  CBC with Differential/Platelet     Status: Abnormal   Collection Time: 04/12/20  4:34 AM  Result Value Ref Range   WBC 6.5 4.0 - 10.5 K/uL   RBC 2.78 (L) 4.22 - 5.81 MIL/uL   Hemoglobin 8.2 (L) 13.0 - 17.0 g/dL   HCT 24.5 (L) 39 - 52 %   MCV 88.1 80.0 - 100.0 fL   MCH 29.5 26.0 - 34.0 pg   MCHC 33.5 30.0 - 36.0 g/dL   RDW 14.4 11.5 - 15.5 %   Platelets 173 150 - 400 K/uL   nRBC 0.0 0.0 - 0.2 %   Neutrophils Relative % 77 %   Neutro Abs 5.0 1.7 - 7.7 K/uL   Lymphocytes Relative 12 %   Lymphs Abs 0.8 0.7 - 4.0 K/uL   Monocytes Relative 9 %   Monocytes Absolute 0.6 0 - 1 K/uL   Eosinophils Relative 2 %   Eosinophils Absolute 0.1 0 - 0 K/uL   Basophils Relative 0 %   Basophils Absolute 0.0 0 - 0 K/uL   Immature Granulocytes 0 %   Abs Immature Granulocytes 0.02 0.00 - 0.07 K/uL    Comment: Performed at [facility], [address] [place], [place] [address]  Basic metabolic panel     Status: Abnormal   Collection Time: 04/12/20  4:34 AM  Result Value Ref Range   Sodium 143 135 - 145 mmol/L   Potassium 4.4 3.5 - 5.1 mmol/L   Chloride 116 (H) 98 - 111 mmol/L   CO2 13 (L) 22 - 32 mmol/L   Glucose, Bld 88 70 - 99 mg/dL    Comment: Glucose reference range applies only to samples taken after fasting for at least 8 hours.   BUN 86 (H) 8 - 23 mg/dL   Creatinine, Ser 10.66 (H) 0.61 - 1.24 mg/dL   Calcium 7.5 (L) 8.9 - 10.3 mg/dL   GFR calc non Af Amer 5 (L) >60 mL/min   GFR calc Af Amer 5 (L) >60 mL/min   Anion gap 14 5 - 15    Comment: Performed at [facility] [address] [place], [place] [address]  Glucose, capillary     Status: None   Collection Time: 04/12/20 10:06 AM  Result Value Ref Range   Glucose-Capillary 83 70 - 99 mg/dL    Comment: Glucose reference range applies only to samples taken after fasting for at least 8 hours.     ROS: Unable to obtain review of system from the patient as he is in postictal..  Physical Exam: Vitals:   04/12/20 1250 04/12/20 1320  BP: (!) 159/90 (!) 164/78  Pulse: (!) 106 102  Resp: (!) 25 22  Temp:    SpO2: 100% 96%     General exam: Confused, sedated male lying in bed with continuous  EEG monitor. Respiratory system: Clear to auscultation. Respiratory effort normal. No wheezing or crackle Cardiovascular system: S1 & S2 heard, RRR.  No pedal edema. Gastrointestinal system: Abdomen is nondistended, soft. Normal bowel sounds heard. 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  He was not adhering with medications and outpatient follow-ups.  He now has ESRD with acidosis and some uremia. I called the patient's daughter about starting dialysis including placement of catheter and permanent access.  She agreed with the plan. Consult IR for tunneled HD catheter placement, start first HD tomorrow. Vein mapping was ordered and consulted Dr. Scot Dock for the placement of permanent access. Informed renal navigator to arrange for outpatient HD.  #Hypertensive urgency on admission due to poor adherence with the medication: Currently receiving IV antihypertensive medication because of n.p.o. status.  Monitor BP.  #Anemia of CKD: Checking iron studies.  May need ESA.  #Secondary hyperparathyroidism: Last PTH level was 158 on  10/04/2019.  Repeating lab.  Resume calcitriol when able to take orally.  #Seizure disorder: He has known history of seizure disorder and reportedly was not taking his medication.  Neurology is following and now undergoing continuous EEG monitoring.  Currently on Keppra and Ativan as needed.  #Metabolic encephalopathy: Multifactorial etiology of getting postictal, sedatives and uremia.  I have discussed with the primary team.  Thank you for the consult, we will follow.  Kaliyan Osbourn Tanna Furry 04/12/2020, 1:55 PM  [facility].

## 2020-04-12 NOTE — Consult Note (Signed)
ASSESSMENT & PLAN   STAGE V CHRONIC KIDNEY DISEASE: We have been consulted for hemodialysis access.  He is having a tunneled dialysis catheter placed tomorrow by interventional radiology to begin dialysis reportedly.  A vein map has been ordered.  Currently I think he is a very poor candidate for placement of permanent access unless he shows significant improvement clinically.  I will follow-up next week to see how he is doing and if he has improved significantly we could consider placement of an AV fistula or AV graft in his left arm given that he has a significant contracture on the right and therefore would be difficult to position his arm for dialysis with right arm access.  REASON FOR CONSULT:    For hemodialysis access.  The consult is requested by [facility] hospitalist.  HPI:   Richard [name] is a 64 y.o. male who was admitted 2 days ago with seizures.  The patient has a complicated history.  He has a history of stage V chronic kidney disease, diabetes, hypertension, and a previous stroke associated with right-sided weakness.  He has a history of poor compliance in the past has reportedly refused hemodialysis access.  He presented via EMS status post seizures and is being worked up.  He is scheduled for placement of a tunneled dialysis catheter tomorrow by interventional radiology.  We were asked to evaluate him for hemodialysis access.   I am unable to obtain any history from the patient.  He does not follow commands currently.  He has an EEG which is in progress.  Past Medical History:  Diagnosis Date   Arthritis    Diabetes mellitus, type II, insulin dependent (HCC)    GERD (gastroesophageal reflux disease)    Hypertension    Hypertensive emergency 05/21/2012   Immune deficiency disorder ([place])    Migraines    Neuropathy in diabetes ([place])    bilat feet   Ruptured lumbar disc    Stroke ([place]) 2013   residual right sided weakness (arm>leg)    Family History   Problem Relation Age of Onset   Other Mother    Stroke Father    Hypertension Father    Epilepsy Father    Breast cancer Sister    Stroke Brother    Heart attack Brother    Diabetes Sister     SOCIAL HISTORY: Social History   Tobacco Use   Smoking status: Current Every Day Smoker    Packs/day: 0.50    Types: Cigarettes   Smokeless tobacco: Never Used  Substance Use Topics   Alcohol use: No    No Known Allergies  Current Facility-Administered Medications  Medication Dose Route Frequency Provider Last Rate Last Admin   [START ON 04/13/2020] Chlorhexidine Gluconate Cloth 2 % PADS 6 each  6 each Topical Q0600 Rosita Fire, MD       clopidogrel (PLAVIX) tablet 75 mg  75 mg Oral Daily Elgergawy, Silver Huguenin, MD   75 mg at 04/11/20 1223   [START ON 04/14/2020] heparin injection 5,000 Units  5,000 Units Subcutaneous Q8H Monia Sabal, PA-C       hydrALAZINE (APRESOLINE) injection 10 mg  10 mg Intravenous Once Elgergawy, Silver Huguenin, MD       hydrALAZINE (APRESOLINE) injection 10 mg  10 mg Intravenous Q6H PRN Elgergawy, Silver Huguenin, MD       labetalol (NORMODYNE) injection 5 mg  5 mg Intravenous Q2H PRN Florencia Reasons, MD       levETIRAcetam (KEPPRA) IVPB  500 mg/100 mL premix  500 mg Intravenous Q12H Florencia Reasons, MD       LORazepam (ATIVAN) 2 MG/ML injection            LORazepam (ATIVAN) 2 MG/ML injection            LORazepam (ATIVAN) injection 2 mg  2 mg Intravenous Once Marliss Coots, PA-C       nitroGLYCERIN 50 mg in dextrose 5 % 250 mL (0.2 mg/mL) infusion  0-200 mcg/min Intravenous Titrated Elgergawy, Silver Huguenin, MD   Stopped at 04/12/20 1049   pantoprazole (PROTONIX) EC tablet 40 mg  40 mg Oral Daily Elgergawy, Silver Huguenin, MD   40 mg at 04/11/20 1224   pravastatin (PRAVACHOL) tablet 80 mg  80 mg Oral QHS Elgergawy, Silver Huguenin, MD   80 mg at 04/11/20 2131   tamsulosin (FLOMAX) capsule 0.4 mg  0.4 mg Oral Daily Elgergawy, Silver Huguenin, MD   0.4 mg at 04/11/20 1224     REVIEW OF SYSTEMS: Unable to obtain  PHYSICAL EXAM:   Vitals:   04/12/20 1220 04/12/20 1239 04/12/20 1250 04/12/20 1320  BP: (!) 130/82 (!) 178/92 (!) 159/90 (!) 164/78  Pulse: 103 (!) 107 (!) 106 102  Resp: (!) 25 (!) 28 (!) 25 22  Temp:      TempSrc:      SpO2: 100% 100% 100% 96%   There is no height or weight on file to calculate BMI. GENERAL: The patient is a markedly debilitated male, in no acute distress. The vital signs are documented above. CARDIAC: There is a regular rate and rhythm.  VASCULAR: He has palpable radial pulses bilaterally. He has an IV in his right upper arm. He has another IV in his left forearm. His right arm is contracted from his stroke. PULMONARY: There is good air exchange bilaterally without wheezing or rales. ABDOMEN: Soft and non-tender with normal pitched bowel sounds.  MUSCULOSKELETAL: His right arm is contracted from his stroke. NEUROLOGIC: He does not answer questions or follow commands. SKIN: There are no ulcers or rashes noted. PSYCHIATRIC: The patient has a normal affect.  DATA:    VEIN MAP: In upper extremity vein map has been ordered but this is pending.  Lab Results  Component Value Date   WBC 6.5 04/12/2020   HGB 8.2 (L) 04/12/2020   HCT 24.5 (L) 04/12/2020   MCV 88.1 04/12/2020   PLT 173 04/12/2020   Lab Results  Component Value Date   NA 143 04/12/2020   K 4.4 04/12/2020   CL 116 (H) 04/12/2020   CO2 13 (L) 04/12/2020   Lab Results  Component Value Date   CREATININE 10.66 (H) 04/12/2020   Lab Results  Component Value Date   INR 1.0 02/13/2020   INR 0.89 08/23/2012   INR 0.95 05/21/2012   Lab Results  Component Value Date   HGBA1C 5.2 02/14/2020   CBG (last 3)  Recent Labs    04/10/20 2031 04/11/20 1620 04/12/20 1006  GLUCAP 91 [facility]: [phone] Office: [phone]

## 2020-04-12 NOTE — Progress Notes (Signed)
LTM EEG hooked up and running - no initial skin breakdown - push button tested - neuro notified.  

## 2020-04-12 NOTE — Consult Note (Signed)
Date of service: April 12, 2020 Patient Name: Richard [name] MRN: [id number] DOB: 12-Aug-1956 Reason for consult: "Stroke code"  Richard [name] is a 64 y.o. male  has a past medical history of Arthritis, Diabetes mellitus, type II, insulin dependent ([place]), GERD (gastroesophageal reflux disease), Hypertension, Hypertensive emergency (05/21/2012), Immune deficiency disorder ([place]), Migraines, Neuropathy in diabetes ([place]), Ruptured lumbar disc, ESRD declined to go on dialysis and Stroke with residual R sided weakness c/b Seizures and non compliance with Keppra who was admitted with breakthrough seizures in the setting of non compliance of Keppra.  A stroke code was called with a LKW of 0000 on 04/12/20. He was seen this AM during shift change and was noted to have forced R gaze deviation and unresponsive.  ROS: Unable to obtain ROS 2/2 patient unresponsive.  NIHSS: 20 MRS: 2 LKW 0000 on 04/12/20.   Past History Past Medical History:  Diagnosis Date  . Arthritis   . Diabetes mellitus, type II, insulin dependent ([place])   . GERD (gastroesophageal reflux disease)   . Hypertension   . Hypertensive emergency 05/21/2012  . Immune deficiency disorder ([place])   . Migraines   . Neuropathy in diabetes ([place])    bilat feet  . Ruptured lumbar disc   . Stroke [facility]) 2013   residual right sided weakness (arm>leg)   Past Surgical History:  Procedure Laterality Date  . KNEE ARTHROSCOPY Bilateral    Family History  Problem Relation Age of Onset  . Other Mother   . Stroke Father   . Hypertension Father   . Epilepsy Father   . Breast cancer Sister   . Stroke Brother   . Heart attack Brother   . Diabetes Sister    Social History   Tobacco Use  . Smoking status: Current Every Day Smoker    Packs/day: 0.50    Types: Cigarettes  . Smokeless tobacco: Never Used  Vaping Use  . Vaping Use: Never used  Substance Use Topics  . Alcohol use: No  . Drug use: Yes    Types: Marijuana   No  Known Allergies   Medications Prior to Admission  Medication Sig Dispense Refill  . amLODipine (NORVASC) 10 MG tablet TAKE 1 TABLET BY MOUTH DAILY (Patient taking differently: Take 10 mg by mouth daily. ) 30 tablet 0  . calcitRIOL (ROCALTROL) 0.25 MCG capsule Take 1 capsule (0.25 mcg total) by mouth daily. 30 capsule 0  . clopidogrel (PLAVIX) 75 MG tablet Take 1 tablet (75 mg total) by mouth daily. 90 tablet 0  . furosemide (LASIX) 40 MG tablet Take 1 tablet (40 mg total) by mouth daily. 90 tablet 0  . hydrALAZINE (APRESOLINE) 25 MG tablet Take 1 tablet (25 mg total) by mouth every 8 (eight) hours. 270 tablet 0  . hydrocortisone cream 1 % Apply 1 application topically 4 (four) times daily as needed for itching.    . pantoprazole (PROTONIX) 40 MG tablet TAKE 1 TABLET BY MOUTH EVERY DAY (Patient taking differently: Take 40 mg by mouth daily. ) 90 tablet 0  . tamsulosin (FLOMAX) 0.4 MG CAPS capsule TAKE 1 CAPSULE BY MOUTH EVERY DAY (Patient taking differently: Take 0.4 mg by mouth daily. ) 30 capsule 0  . ACCU-CHEK AVIVA PLUS test strip 1 each by Other route See admin instructions. Use 1 test strip to test blood sugar two-three times daily  1  . ACCU-CHEK AVIVA PLUS test strip USE 1 strip TO test blood sugar 2 TIMES DAILY TO 3 TIMES  DAILY (Patient taking differently: 1 each by Other route in the morning, at noon, and at bedtime. ) 100 strip 1  . blood glucose meter kit and supplies KIT Dispense based on patient and insurance preference. Check blood sugar 4 times a day (Patient taking differently: Inject 1 each into the skin See admin instructions. Dispense based on patient and insurance preference. Check blood sugar 4 times a day) 1 each 0  . chlorthalidone (HYGROTON) 25 MG tablet Take 1 tablet (25 mg total) by mouth daily. 90 tablet 0  . Cholecalciferol (VITAMIN D) 50 MCG (2000 UT) tablet Take 1 tablet (2,000 Units total) by mouth in the morning. 30 tablet 0  . cloNIDine (CATAPRES) 0.2 MG tablet Take  1 tablet (0.2 mg total) by mouth 2 (two) times daily. 180 tablet 0  . famotidine (PEPCID) 20 MG tablet Take 1 tablet (20 mg total) by mouth 2 (two) times daily. 180 tablet 0  . hydrOXYzine (ATARAX/VISTARIL) 25 MG tablet Take 1 tablet (25 mg total) by mouth every 8 (eight) hours as needed for itching. 12 tablet 0  . insulin aspart protamine - aspart (NOVOLOG MIX 70/30 FLEXPEN) (70-30) 100 UNIT/ML FlexPen INJECT 28 UNITS INTO THE SKIN EVERY MORNING (Patient taking differently: Inject 28 Units into the skin in the morning. ) 15 mL 0  . Insulin Pen Needle (SURE COMFORT PEN NEEDLES) 31G X 8 MM MISC Use as directed twice daily with Novolog flex pen (Patient taking differently: 1 each by Other route See admin instructions. Use as directed twice daily with Novolog flex pen) 100 each 3  . levETIRAcetam (KEPPRA) 500 MG tablet Take 1 tablet (500 mg total) by mouth 2 (two) times daily. 60 tablet 0  . Misc. Devices (COMMODE 3-IN-1) MISC Use as directed 1 each 0  . Misc. Devices (TRANSFER BENCH) MISC Use as directed 1 each 0  . NARCAN 4 MG/0.1ML LIQD nasal spray kit Place 0.4 mg into the nose once.     . Oxycodone HCl 20 MG TABS Take 20 mg by mouth every 6 (six) hours as needed (pain).     . potassium chloride SA (KLOR-CON) 20 MEQ tablet Take 20 mEq by mouth daily.     . pravastatin (PRAVACHOL) 80 MG tablet Take 1 tablet (80 mg total) by mouth at bedtime. 90 tablet 0  . sildenafil (REVATIO) 20 MG tablet Take 20-100 mg by mouth as needed for erectile dysfunction.    Richard [name] INSULIN SYRINGE 31G X 5/16" 1 ML MISC Use pen needle with insulin 2 times daily (Patient taking differently: 1 each by Other route in the morning and at bedtime. With insulin) 100 each 1     Temp:  [97.7 F (36.5 C)-99.2 F (37.3 C)] 97.7 F (36.5 C) (07/22 1620) Pulse Rate:  [88-110] 96 (07/22 1620) Resp:  [18-29] 20 (07/22 1620) BP: (127-184)/(72-104) 164/86 (07/22 1620) SpO2:  [96 %-100 %] 100 % (07/22 1620) There is no height or  weight on file to calculate BMI.  General: Laying in bed; eyes cloed. HENT: Normal oropharynx and mucosa. Normal external appearance of ears and nose Neck: Supple, no pain or tenderness. CV: RRR without murmur. No peripheral edema. Pulmonary: Symmetric chest rise. Normal respiratoy effort. Abdomen: positive bowel sounds, soft, non-tender. Ext: No cyanosis, edema, or deformity. Skin: No rash. Normal palpation of skin. Musculoskeletal: Normal digits and nails by inspection. No clubbing.  Neurologic Examination  MENTAL STATUS: No response to voice, grimace to nares stimulation. LANG/SPEECH: unresponsive. Pupils: Equal round  and reactive. CN3,4,6: forced R gaze deviation. Cough + Gag was initially weak, later improved and strongly positive with him biting on the tongue depressor. Corneal + BL. Does not withdraw to pain in any extremities. R arm and leg spastic with R arm contractures.  REFLEXES: 3 on the Right, 2 on the left  Imaging/Labs/Diagnostics: CTH: No acute abnoramlity MRI Brain with a tiny acute infarct in Left parietal lobe and advanced white matter disease.  Impression and plan: Richard [name] is a 64 y.o. male with PMH significant for arthritis, Diabetes mellitus, type II, insulin dependent ([place]), GERD (gastroesophageal reflux disease), Hypertension, Hypertensive emergency (05/21/2012), Immune deficiency disorder ([place]), Migraines, Neuropathy in diabetes ([place]), Ruptured lumbar disc, ESRD declined to go on dialysis and Stroke with residual R sided weakness c/b Seizures and non compliance with Keppra who was admitted with breakthrough seizures in the setting of non compliance of Keppra.  Stroke alert called today for R gaze deviation. The deviation to the side of his baseline weakness(R side) is more suggestive of this being a seizure than a stroke. My suspicion for a stroke was low. However, given his previous hx, poorly controlled risk factors and non compliance, we  obtained a STAT MRI Brain which demonstrated a tiny 107m punctate infarct in the L parietal lobe. The stroke does not explain his symptoms. We treated along the Status pathway with ativan 270m Keppra and Vimpat load. Avoided other sedating AEDs given his poor mentation after ativan. His gaze deviation improved and he was noted to be snoring. We were able to hook him up to cEEG right away which was notable for moderate encephalopathy but no seizures. I think he is just post ictal and probably is encephalopathic right now due to being post ictal and also due to toxic metabolic abnormalities given his ESRD and not on dialysis and poorly controlled Diabetes and HTN.  Recs: - I ordered cEEG - I ordered MRI Brain without contrast - I ordered Ativan 68m31mKeppra 1500m61md Vimpat 200mg868md - I ordered continuing Vimpat 100mg 88mand continuing home Keppra 500mg B59m We will continue to follow along. - Please push the button on cEEG macine if he has seizure. - Give Ativan 68mg for19mizure lasting over 2 mins.  ______________________________________________________________________  Thank you for the opportunity to take part in the care of this patient. If you have any further questions, please contact the neurology consultation attending on call. Signed,   Massie Cogliano [place] [phone]

## 2020-04-12 NOTE — Consult Note (Addendum)
Chief Complaint: Patient was seen in consultation today for tunneled dialysis catheter placement Chief Complaint  Patient presents with  . Seizures   at the request of Dr Katheran James   Supervising Physician: Corrie Mckusick  Patient Status: [facility] - In-pt  History of Present Illness: Richard [name] is a 64 y.o. 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  He was not adhering with medications and outpatient follow-ups.  He now has ESRD with acidosis and some uremia. I called the patient's daughter about starting dialysis including placement of catheter and permanent access.  She agreed with the plan. Consult IR for tunneled HD catheter placement, start first HD tomorrow. Vein mapping was ordered and consulted Dr. Scot Dock for the placement of permanent access. Informed renal navigator to arrange for outpatient HD.  Scheduled for tunneled dialysis catheter in IR tomorrow   Past Medical History:  Diagnosis Date  . Arthritis   . Diabetes mellitus, type II, insulin dependent ([place])   . GERD (gastroesophageal reflux disease)   . Hypertension   . Hypertensive emergency 05/21/2012  . Immune deficiency disorder ([place])   . Migraines   . Neuropathy in diabetes ([place])    bilat feet  . Ruptured lumbar disc   . Stroke [facility]) 2013   residual right sided weakness (arm>leg)    Past Surgical History:  Procedure Laterality Date  . KNEE ARTHROSCOPY Bilateral     Allergies: Patient has no known allergies.  Medications: Prior  to Admission medications   Medication Sig Start Date End Date Taking? Authorizing Provider  amLODipine (NORVASC) 10 MG tablet TAKE 1 TABLET BY MOUTH DAILY Patient taking differently: Take 10 mg by mouth daily.  03/28/20  Yes Koberlein, Junell C, MD  calcitRIOL (ROCALTROL) 0.25 MCG capsule Take 1 capsule (0.25 mcg total) by mouth daily. 03/07/20  Yes Koberlein, [name], MD  clopidogrel (PLAVIX) 75 MG tablet Take 1 tablet (75 mg total) by mouth daily. 03/07/20  Yes Koberlein, Junell C, MD  furosemide (LASIX) 40 MG tablet Take 1 tablet (40 mg total) by mouth daily. 03/07/20  Yes Koberlein, [name], MD  hydrALAZINE (APRESOLINE) 25 MG tablet Take 1 tablet (25 mg total) by mouth every 8 (eight) hours. 03/07/20  Yes Koberlein, Junell C, MD  hydrocortisone cream 1 % Apply 1 application topically 4 (four) times daily as needed for itching.   Yes [provider]  pantoprazole (PROTONIX) 40 MG tablet TAKE 1 TABLET BY MOUTH EVERY DAY Patient taking differently: Take 40 mg by mouth daily.  01/23/20  Yes Koberlein, Junell C, MD  tamsulosin (FLOMAX) 0.4 MG CAPS capsule TAKE 1 CAPSULE BY MOUTH EVERY DAY Patient taking differently: Take 0.4 mg by mouth daily.  03/28/20  Yes Koberlein, [name], MD  ACCU-CHEK AVIVA PLUS test strip 1 each by Other route See admin instructions. Use 1 test strip to test blood sugar two-three times daily 04/20/18   [provider]  ACCU-CHEK AVIVA PLUS test strip USE 1 strip TO test blood sugar 2 TIMES DAILY TO 3 TIMES DAILY Patient taking differently: 1 each  by Other route in the morning, at noon, and at bedtime.  06/10/19   [name], MD  blood glucose meter kit and supplies KIT Dispense based on patient and insurance preference. Check blood sugar 4 times a day Patient taking differently: Inject 1 each into the skin See admin instructions. Dispense based on patient and insurance preference. Check blood sugar 4 times a day 06/11/18   [name], MD   chlorthalidone (HYGROTON) 25 MG tablet Take 1 tablet (25 mg total) by mouth daily. 03/07/20   [name], MD  Cholecalciferol (VITAMIN D) 50 MCG (2000 UT) tablet Take 1 tablet (2,000 Units total) by mouth in the morning. 03/07/20   [name], MD  cloNIDine (CATAPRES) 0.2 MG tablet Take 1 tablet (0.2 mg total) by mouth 2 (two) times daily. 03/07/20   [name], MD  famotidine (PEPCID) 20 MG tablet Take 1 tablet (20 mg total) by mouth 2 (two) times daily. 03/07/20   [name], MD  hydrOXYzine (ATARAX/VISTARIL) 25 MG tablet Take 1 tablet (25 mg total) by mouth every 8 (eight) hours as needed for itching. 03/27/20   Tegeler, Gwenyth Allegra, MD  insulin aspart protamine - aspart (NOVOLOG MIX 70/30 FLEXPEN) (70-30) 100 UNIT/ML FlexPen INJECT 28 UNITS INTO THE SKIN EVERY MORNING Patient taking differently: Inject 28 Units into the skin in the morning.  12/01/19   Koberlein, [name], MD  Insulin Pen Needle (SURE COMFORT PEN NEEDLES) 31G X 8 MM MISC Use as directed twice daily with Novolog flex pen Patient taking differently: 1 each by Other route See admin instructions. Use as directed twice daily with Novolog flex pen 02/10/19   Koberlein, [name], MD  levETIRAcetam (KEPPRA) 500 MG tablet Take 1 tablet (500 mg total) by mouth 2 (two) times daily. 03/07/20 04/06/20  [name], MD  Misc. Devices (COMMODE 3-IN-1) MISC Use as directed 02/24/20   [name], MD  Misc. Devices (TRANSFER BENCH) MISC Use as directed 02/24/20   Koberlein, [name], MD  NARCAN 4 MG/0.1ML LIQD nasal spray kit Place 0.4 mg into the nose once.  05/31/18   [provider]  Oxycodone HCl 20 MG TABS Take 20 mg by mouth every 6 (six) hours as needed (pain).  05/31/18   [provider]  potassium chloride SA (KLOR-CON) 20 MEQ tablet Take 20 mEq by mouth daily.  12/07/19   [provider]  pravastatin (PRAVACHOL) 80 MG tablet Take 1 tablet (80 mg total) by mouth at bedtime.  03/07/20   [name], MD  sildenafil (REVATIO) 20 MG tablet Take 20-100 mg by mouth as needed for erectile dysfunction. 03/28/20   [provider]  TRUEPLUS INSULIN SYRINGE 31G X 5/16" 1 ML MISC Use pen needle with insulin 2 times daily Patient taking differently: 1 each by Other route in the morning and at bedtime. With insulin 03/31/19   [name], MD     Family History  Problem Relation Age of Onset  . Other Mother   . Stroke Father   . Hypertension Father   . Epilepsy Father   . Breast cancer Sister   . Stroke Brother   . Heart attack Brother   . Diabetes Sister     Social History   Socioeconomic History  . Marital status: Single    Spouse name: Not on file  . Number of children: Not on file  . Years of education: Not on file  .  Highest education level: Not on file  Occupational History  . Not on file  Tobacco Use  . Smoking status: Current Every Day Smoker    Packs/day: 0.50    Types: Cigarettes  . Smokeless tobacco: Never Used  Vaping Use  . Vaping Use: Never used  Substance and Sexual Activity  . Alcohol use: No  . Drug use: Yes    Types: Marijuana  . Sexual activity: Not on file  Other Topics Concern  . Not on file  Social History Narrative  . Not on file   Social Determinants of Health   Financial Resource Strain:   . Difficulty of Paying Living Expenses:   Food Insecurity:   . Worried About Charity fundraiser in the Last Year:   . Arboriculturist in the Last Year:   Transportation Needs:   . Film/video editor (Medical):   Marland Kitchen Lack of Transportation (Non-Medical):   Physical Activity:   . Days of Exercise per Week:   . Minutes of Exercise per Session:   Stress:   . Feeling of Stress :   Social Connections:   . Frequency of Communication with Friends and Family:   . Frequency of Social Gatherings with Friends and Family:   . Attends Religious Services:   . Active Member of Clubs or Organizations:   . Attends Theatre manager Meetings:   Marland Kitchen Marital Status:     Review of Systems: A 12 point ROS discussed and pertinent positives are indicated in the HPI above.  All other systems are negative.   Vital Signs: BP (!) 164/78   Pulse 102   Temp 99.2 F (37.3 C) (Oral)   Resp 22   SpO2 96%   Physical Exam Vitals reviewed.  Cardiovascular:     Rate and Rhythm: Normal rate and regular rhythm.     Heart sounds: Normal heart sounds.  Pulmonary:     Breath sounds: Normal breath sounds.  Skin:    General: Skin is warm.  Neurological:     Comments: Sedated No response  Psychiatric:     Comments: Spoke to Dtr [name] via phone for consent     Imaging: CT Head Wo Contrast  Result Date: 04/10/2020 CLINICAL DATA:  Seizure, altered level of consciousness EXAM: CT HEAD WITHOUT CONTRAST TECHNIQUE: Contiguous axial images were obtained from the base of the skull through the vertex without intravenous contrast. COMPARISON:  03/27/2020 FINDINGS: Brain: Evaluation is limited due to patient motion during the exam. No signs of acute infarct or hemorrhage. Lateral ventricles and midline structures are stable. No acute extra-axial fluid collections. No mass effect. Vascular: No hyperdense vessel or unexpected calcification. Skull: Normal. Negative for fracture or focal lesion. Sinuses/Orbits: There is mucosal thickening within the bilateral maxillary sinuses and left frontal sinus, grossly stable. Other: None. IMPRESSION: 1. No acute intracranial process on this exam limited by excessive patient motion. No change since previous study. Electronically Signed   By: Randa Ngo M.D.   On: 04/10/2020 21:59   CT Head Wo Contrast  Result Date: 03/27/2020 CLINICAL DATA:  64 year old male with head trauma. EXAM: CT HEAD WITHOUT CONTRAST TECHNIQUE: Contiguous axial images were obtained from the base of the skull through the vertex without intravenous contrast. COMPARISON:  Head CT dated 02/13/2020. FINDINGS: Brain:  There is mild age-related atrophy and moderate chronic microvascular ischemic changes. There is no acute intracranial hemorrhage. No mass effect or midline shift. No extra-axial fluid collection. Stable prominence of  the left frontal extra-axial space. Vascular: No hyperdense vessel or unexpected calcification. Skull: Normal. Negative for fracture or focal lesion. Sinuses/Orbits: Mild diffuse mucoperiosteal thickening of paranasal sinuses. No air-fluid level. The mastoid air cells are clear. Other: None IMPRESSION: 1. No acute intracranial pathology. 2. Age-related atrophy and chronic microvascular ischemic changes. Electronically Signed   By: Anner Crete M.D.   On: 03/27/2020 19:14   CT Cervical Spine Wo Contrast  Addendum Date: 03/27/2020   ADDENDUM REPORT: 03/27/2020 21:10 ADDENDUM: The suspected part solid nodule is actually imaged in its entirety on the CT of the thoracic spine which was acquired concurrently but reported separately. It is quite similar based on comparison with 2012 and is therefore likely to be scarring. There is some other scattered areas of ground-glass for which follow-up could be considered many of these also likely present though the chest is incompletely imaged on today's exam. This finding in the thyroid nodule were discussed with the referring provider as outlined below. These results were called by telephone at the time of interpretation on 03/27/2020 at 9:10 pm to provider [facility] , who verbally acknowledged these results. Electronically Signed   By: Zetta Bills M.D.   On: 03/27/2020 21:10   Result Date: 03/27/2020 CLINICAL DATA:  Back trauma EXAM: CT CERVICAL SPINE WITHOUT CONTRAST TECHNIQUE: Multidetector CT imaging of the cervical spine was performed without intravenous contrast. Multiplanar CT image reconstructions were also generated. COMPARISON:  April 2011 FINDINGS: Alignment: Is mild reversal of normal cervical lordosis and straightening likely due to  position and associated degenerative changes. Skull base and vertebrae: No acute fracture. No primary bone lesion or focal pathologic process. Soft tissues and spinal canal: Nodular thyroid gland with a nodule in the RIGHT hemi thyroid measuring 2 cm. Disc levels: Multilevel degenerative changes in the spine. Disc space narrowing greatest at C3-4. Small anterior osteophytes at nearly all visualized levels. Atlantoaxial degenerative changes as well. Upper chest: Spiculated appearing part solid nodule in the RIGHT upper lobe is suggested potentially measuring 14 x 13 mm (image 88, series 3) Other: Calcified atheromatous plaque of the carotid arteries bilaterally. IMPRESSION: 1. No acute fracture or malalignment of the cervical spine. 2. Multilevel degenerative changes worse than in 2011. 3. Spiculated appearing part solid nodule in the RIGHT upper lobe is suggested potentially measuring 14 x 13 mm. Further evaluation with dedicated CT of the chest is recommended. 4. Nodular thyroid gland with a nodule in the RIGHT hemi thyroid measuring 2 cm. Recommend thyroid US (ref: J Am Coll Radiol. 2015 Feb;12(2): 143-50). 5. Calcified atheromatous plaque of the carotid arteries bilaterally. Electronically Signed: By: Zetta Bills M.D. On: 03/27/2020 20:59   CT Thoracic Spine Wo Contrast  Result Date: 03/27/2020 CLINICAL DATA:  [place] with subsequent back pain EXAM: CT THORACIC AND LUMBAR SPINE WITHOUT CONTRAST TECHNIQUE: Multidetector CT imaging of the thoracic and lumbar spine was performed without contrast. Multiplanar CT image reconstructions were also generated. COMPARISON:  None. FINDINGS: CT THORACIC SPINE FINDINGS Alignment: Normal Vertebrae: Normal Paraspinal and other soft tissues: Normal except for small areas of pulmonary scarring. Disc levels: No significant disc space pathology. No evidence of soft disc herniation. No endplate or facet osteophytes. No canal or foraminal stenosis. CT LUMBAR SPINE FINDINGS  Segmentation: 5 lumbar type vertebral bodies. Alignment: Normal Vertebrae: No fracture or focal bone lesion. Paraspinal and other soft tissues: Aortic atherosclerosis. Otherwise negative. Disc levels: No significant degenerative changes. No disc herniation. No canal or foraminal stenosis. Ordinary mild facet osteoarthritis  in the lumbar region without slippage or advanced disease. IMPRESSION: CT THORACIC SPINE IMPRESSION No traumatic finding.  Normal thoracic CT. CT LUMBAR SPINE IMPRESSION No traumatic finding. Ordinary mild lumbar facet osteoarthritis. No apparent stenosis or neural compression. Electronically Signed   By: Nelson Chimes M.D.   On: 03/27/2020 20:54   CT Lumbar Spine Wo Contrast  Result Date: 03/27/2020 CLINICAL DATA:  [place] with subsequent back pain EXAM: CT THORACIC AND LUMBAR SPINE WITHOUT CONTRAST TECHNIQUE: Multidetector CT imaging of the thoracic and lumbar spine was performed without contrast. Multiplanar CT image reconstructions were also generated. COMPARISON:  None. FINDINGS: CT THORACIC SPINE FINDINGS Alignment: Normal Vertebrae: Normal Paraspinal and other soft tissues: Normal except for small areas of pulmonary scarring. Disc levels: No significant disc space pathology. No evidence of soft disc herniation. No endplate or facet osteophytes. No canal or foraminal stenosis. CT LUMBAR SPINE FINDINGS Segmentation: 5 lumbar type vertebral bodies. Alignment: Normal Vertebrae: No fracture or focal bone lesion. Paraspinal and other soft tissues: Aortic atherosclerosis. Otherwise negative. Disc levels: No significant degenerative changes. No disc herniation. No canal or foraminal stenosis. Ordinary mild facet osteoarthritis in the lumbar region without slippage or advanced disease. IMPRESSION: CT THORACIC SPINE IMPRESSION No traumatic finding.  Normal thoracic CT. CT LUMBAR SPINE IMPRESSION No traumatic finding. Ordinary mild lumbar facet osteoarthritis. No apparent stenosis or neural  compression. Electronically Signed   By: Nelson Chimes M.D.   On: 03/27/2020 20:54   [name]  Result Date: 04/12/2020 CLINICAL DATA:  Neuro deficit, acute, stroke suspected; concern for seizure versus stroke. EXAM: MRI HEAD WITHOUT CONTRAST TECHNIQUE: Multiplanar, multiecho pulse sequences of the brain and surrounding structures were obtained without intravenous contrast. COMPARISON:  Head CT 04/12/2020, head CT 04/10/2020, brain MRI 02/13/2019 FINDINGS: Brain: Mild intermittent motion degradation. Stable, mild generalized parenchymal atrophy. 2 mm focus of subtle restricted diffusion within the left parietal periventricular white matter consistent with small acute or early subacute infarct (series 4, image 11). There is no convincing evidence of acute infarct elsewhere within the brain. Stable moderate to advanced multifocal T2/FLAIR hyperintensity within the cerebral white matter which is nonspecific, but consistent with chronic small vessel ischemic disease. To a lesser degree, chronic small vessel ischemic changes are present within the pons. Redemonstrated chronic small-vessel infarct within the left corona radiata/basal ganglia. Small chronic lacunar infarcts also redemonstrated within the posterior right lentiform nucleus, thalami and left cerebellum. As before, there are few scattered chronic microhemorrhages within the basal ganglia, left thalamus and left cerebellum. This likely reflect sequela of chronic hypertensive microangiopathy. The hippocampi are symmetric in size and signal. No evidence of intracranial mass. No extra-axial fluid collection. No midline shift. Unchanged small arachnoid cyst overlying the anterior left frontal lobe. Vascular: Expected proximal arterial flow voids. Skull and upper cervical spine: No focal marrow lesion. Sinuses/Orbits: Visualized orbits show no acute finding. Paranasal sinus mucosal thickening. Most notably, there is severe left frontal sinus mucosal  thickening and moderate mucosal thickening within the ethmoid, sphenoid and maxillary sinuses. Trace fluid within left mastoid air cells. Other: Nonspecific cystic lesion in along the left external ear measuring 7 mm (series 6, image 1) IMPRESSION: Mildly motion degraded examination. 2 mm acute or early subacute infarct within the left parietal lobe periatrial white matter. Stable moderate to advanced chronic small vessel ischemic disease. Redemonstrated chronic small-vessel infarcts within the left corona radiata/basal ganglia, right basal ganglia, thalami and left cerebellum. Stable, mild generalized parenchymal atrophy. 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 Pansinusitis. Trace left mastoid effusion. Nonspecific 7 mm cystic appearing lesion along the left external ear. Direct visualization recommended. Electronically Signed   By: Kellie Simmering DO   On: 04/12/2020 12:10   US RENAL  Result Date: 04/11/2020 CLINICAL DATA:  Elevated creatinine. EXAM: RENAL / URINARY TRACT ULTRASOUND COMPLETE COMPARISON:  06/24/2019 and CT abdomen pelvis 09/03/2017 FINDINGS: Right Kidney: Renal measurements: 10.0 x 5.4 x 3.3 centimeters = volume: 93.1 mL. Renal parenchyma is heterogeneous and echogenic. Cyst previously identified in the UPPER pole region RIGHT kidney is not well seen. Suspect small parapelvic cyst. Left Kidney: Renal measurements: 9.2 x 4.2 x 3.5 centimeters = volume: 10.5 mL. Renal parenchyma is heterogeneous and echogenic. Bladder: Appears normal for degree of bladder distention. Other: None. IMPRESSION: No hydronephrosis. Heterogeneous echogenic renal parenchyma bilaterally. Electronically Signed   By: Nolon Nations M.D.   On: 04/11/2020 18:33   EEG LTVM - Continuous Bedside W/ Video Includes Portable EEG Read  Result Date: 04/12/2020 Lora Havens, MD     04/12/2020  1:04 PM Patient Name: Richard [name] MRN:  [id number] Epilepsy Attending: Lora Havens Referring Physician/Provider: Dr Donnetta Simpers Date: 04/12/2020 Duration: 25.21 mins Patient history: 64yo M with forced right gaze deviation concerning for seizure. EEG to evaluate for seizure. Level of alertness: Awake, asleep AEDs during EEG study: LEV, vimpat Technical aspects: This EEG study was done with scalp electrodes positioned according to the 10-20 International system of electrode placement. Electrical activity was acquired at a sampling rate of '500Hz'  and reviewed with a high frequency filter of '70Hz'  and a low frequency filter of '1Hz' . EEG data were recorded continuously and digitally stored. Description: No posterior dominant rhythm was seen. Sleep was characterized by vertex waves, sleep spindles (12 to 14 Hz), maximal frontocentral region.  EEG showed continuous generalized rhythmic 3 to 6 Hz theta-delta slowing. Hyperventilation and photic stimulation were not performed.   ABNORMALITY -Continuous rhythmic slow, generalized IMPRESSION: This study is suggestive of moderate diffuse encephalopathy, nonspecific etiology. No seizures or epileptiform discharges were seen throughout the recording. Priyanka Barbra Sarks   DG Hip Unilat W or Wo Pelvis 2-3 Views Left  Result Date: 03/27/2020 CLINICAL DATA:  Fall EXAM: DG HIP (WITH OR WITHOUT PELVIS) 2-3V LEFT COMPARISON:  01/07/2016 FINDINGS: SI joints are non widened. Pubic symphysis and rami are intact. No fracture or malalignment. There are vascular calcifications. IMPRESSION: Negative. Electronically Signed   By: Donavan Foil M.D.   On: 03/27/2020 20:47   CT HEAD CODE STROKE WO CONTRAST  Addendum Date: 04/12/2020   ADDENDUM REPORT: 04/12/2020 10:49 ADDENDUM: These results were called by telephone at the time of interpretation on 04/12/2020 at 10:49 am to provider Sal, who verbally acknowledged these results. Electronically Signed   By: Franchot Gallo M.D.   On: 04/12/2020 10:49   Result Date:  04/12/2020 CLINICAL DATA:  Code stroke. Acute neuro deficit. Rule out stroke. Right-sided gaze preference. EXAM: CT HEAD WITHOUT CONTRAST TECHNIQUE: Contiguous axial images were obtained from the base of the skull through the vertex without intravenous contrast. COMPARISON:  CT head 04/10/2020.  MRI head 02/13/2020 FINDINGS: Brain: Generalized atrophy. Prominent white matter hypodensity bilaterally similar to prior studies compatible with chronic microvascular ischemia. Findings stable compared with the prior study. Negative for acute hemorrhage or mass. Vascular: Negative for hyperdense vessel Skull: No focal skeletal lesion. Sinuses/Orbits: Moderate to extensive mucosal edema paranasal sinuses. Negative orbit Other: None ASPECTS [facility] Stroke Program Early CT  Score) - Ganglionic level infarction (caudate, lentiform nuclei, internal capsule, insula, M1-M3 cortex): 7 - Supraganglionic infarction (M4-M6 cortex): 3 Total score (0-10 with 10 being normal): 10 IMPRESSION: 1. Extensive white matter changes consistent with chronic microvascular ischemia. This is stable from prior studies. No acute abnormality. 2. ASPECTS is 10 Electronically Signed: By: Franchot Gallo M.D. On: 04/12/2020 10:40    Labs:  CBC: Recent Labs    04/10/20 2037 04/11/20 0018 04/11/20 0402 04/12/20 0434  WBC 6.6 6.8 9.1 6.5  HGB 10.3* 11.4* 9.7* 8.2*  HCT 31.5* 34.5* 29.4* 24.5*  PLT 192 201 195 173    COAGS: Recent Labs    02/13/20 1721  INR 1.0  APTT 31    BMP: Recent Labs    02/15/20 0151 02/15/20 0151 04/10/20 2037 04/11/20 0018 04/11/20 0402 04/12/20 0434  NA 139  --  143  --  145 143  K 3.7  --  4.2  --  4.4 4.4  CL 111  --  115*  --  117* 116*  CO2 16*  --  13*  --  11* 13*  GLUCOSE 119*  --  106*  --  94 88  BUN 64*  --  76*  --  78* 86*  CALCIUM 7.7*  7.4*  --  7.5*  --  7.6* 7.5*  CREATININE 10.21*   < > 9.98* 9.99* 9.99* 10.66*  GFRNONAA 5*   < > 5* 5* 5* 5*  GFRAA 6*   < > 6* 6* 6* 5*   <  > = values in this interval not displayed.    LIVER FUNCTION TESTS: Recent Labs    06/22/19 1505 06/22/19 1505 02/13/20 1721 02/14/20 0934 02/15/20 0151 04/10/20 2037  BILITOT 0.4  --  0.7 0.6  --  0.5  AST 14  --  12* 20  --  13*  ALT 8  --  10 11  --  10  ALKPHOS 121*  --  89 83  --  95  PROT 5.4*  --  6.3* 5.8*  --  6.6  ALBUMIN 2.3*   < > 3.0* 2.7* 2.4* 2.8*   < > = values in this interval not displayed.    TUMOR MARKERS: No results for input(s): AFPTM, CEA, CA199, CHROMGRNA in the last 8760 hours.  Assessment and Plan:  A/CKD To initiate dialysis asap Sz disorder-- sedated/Ativan/ Keppra Scheduled for tunneled hemodialysis catheter placement in IR tomorrow Risks and benefits discussed with the patient's daughter [name] via phone including, but not limited to bleeding, infection, vascular injury, pneumothorax which may require chest tube placement, air embolism or even death  All questions were answered, she is agreeable to proceed. Consent signed and in chart.   Thank you for this interesting consult.  I greatly enjoyed meeting Richard [name] and look forward to participating in their care.  A copy of this report was sent to the requesting provider on this date.  Electronically Signed: Lavonia Drafts, PA-C 04/12/2020, 3:17 PM   I spent a total of 20 Minutes    in face to face in clinical consultation, greater than 50% of which was counseling/coordinating care for tunneled dialysis catheter

## 2020-04-12 NOTE — Progress Notes (Signed)
Pt more alert to speech and noxious stimuli. RR RN notified and MD notified. Blood gas advised and obtained by respiratory department.

## 2020-04-12 NOTE — Procedures (Signed)
Patient Name: Richard [name]  MRN: [id number]  Epilepsy Attending: Lora Havens  Referring Physician/Provider: Dr Donnetta Simpers Date: 04/12/2020 Duration: 25.21 mins  Patient history: 64yo M with forced right gaze deviation concerning for seizure. EEG to evaluate for seizure.  Level of alertness: Awake, asleep  AEDs during EEG study: LEV, vimpat  Technical aspects: This EEG study was done with scalp electrodes positioned according to the 10-20 International system of electrode placement. Electrical activity was acquired at a sampling rate of 500Hz  and reviewed with a high frequency filter of 70Hz  and a low frequency filter of 1Hz . EEG data were recorded continuously and digitally stored.   Description: No posterior dominant rhythm was seen. Sleep was characterized by vertex waves, sleep spindles (12 to 14 Hz), maximal frontocentral region.  EEG showed continuous generalized rhythmic 3 to 6 Hz theta-delta slowing. Hyperventilation and photic stimulation were not performed.     ABNORMALITY -Continuous rhythmic slow, generalized  IMPRESSION: This study is suggestive of moderate diffuse encephalopathy, nonspecific etiology. No seizures or epileptiform discharges were seen throughout the recording.  Richard [name]

## 2020-04-13 ENCOUNTER — Inpatient Hospital Stay (HOSPITAL_COMMUNITY): Payer: Medicare HMO

## 2020-04-13 DIAGNOSIS — G9340 Encephalopathy, unspecified: Secondary | ICD-10-CM | POA: Diagnosis not present

## 2020-04-13 DIAGNOSIS — F17210 Nicotine dependence, cigarettes, uncomplicated: Secondary | ICD-10-CM

## 2020-04-13 DIAGNOSIS — G9349 Other encephalopathy: Secondary | ICD-10-CM

## 2020-04-13 DIAGNOSIS — R569 Unspecified convulsions: Secondary | ICD-10-CM | POA: Diagnosis not present

## 2020-04-13 DIAGNOSIS — I6930 Unspecified sequelae of cerebral infarction: Secondary | ICD-10-CM | POA: Diagnosis not present

## 2020-04-13 DIAGNOSIS — R4182 Altered mental status, unspecified: Secondary | ICD-10-CM

## 2020-04-13 DIAGNOSIS — E119 Type 2 diabetes mellitus without complications: Secondary | ICD-10-CM | POA: Diagnosis not present

## 2020-04-13 DIAGNOSIS — G9341 Metabolic encephalopathy: Secondary | ICD-10-CM

## 2020-04-13 DIAGNOSIS — E1140 Type 2 diabetes mellitus with diabetic neuropathy, unspecified: Secondary | ICD-10-CM

## 2020-04-13 DIAGNOSIS — I120 Hypertensive chronic kidney disease with stage 5 chronic kidney disease or end stage renal disease: Secondary | ICD-10-CM

## 2020-04-13 DIAGNOSIS — I6389 Other cerebral infarction: Secondary | ICD-10-CM

## 2020-04-13 DIAGNOSIS — N186 End stage renal disease: Secondary | ICD-10-CM

## 2020-04-13 DIAGNOSIS — R21 Rash and other nonspecific skin eruption: Secondary | ICD-10-CM

## 2020-04-13 DIAGNOSIS — B20 Human immunodeficiency virus [HIV] disease: Secondary | ICD-10-CM

## 2020-04-13 DIAGNOSIS — E1122 Type 2 diabetes mellitus with diabetic chronic kidney disease: Secondary | ICD-10-CM

## 2020-04-13 DIAGNOSIS — N185 Chronic kidney disease, stage 5: Secondary | ICD-10-CM | POA: Diagnosis not present

## 2020-04-13 DIAGNOSIS — N189 Chronic kidney disease, unspecified: Secondary | ICD-10-CM

## 2020-04-13 DIAGNOSIS — K219 Gastro-esophageal reflux disease without esophagitis: Secondary | ICD-10-CM

## 2020-04-13 HISTORY — PX: IR US GUIDE VASC ACCESS RIGHT: IMG2390

## 2020-04-13 HISTORY — DX: Encephalopathy, unspecified: G93.40

## 2020-04-13 HISTORY — PX: IR FLUORO GUIDE CV LINE RIGHT: IMG2283

## 2020-04-13 LAB — GLUCOSE, CAPILLARY
Glucose-Capillary: 94 mg/dL (ref 70–99)
Glucose-Capillary: 105 mg/dL — ABNORMAL HIGH (ref 70–99)

## 2020-04-13 LAB — IRON AND TIBC
Iron: 14 ug/dL — ABNORMAL LOW (ref 45–182)
Saturation Ratios: 9 % — ABNORMAL LOW (ref 17.9–39.5)
TIBC: 151 ug/dL — ABNORMAL LOW (ref 250–450)
UIBC: 137 ug/dL

## 2020-04-13 LAB — RENAL FUNCTION PANEL
Albumin: 2.3 g/dL — ABNORMAL LOW (ref 3.5–5.0)
Anion gap: 17 — ABNORMAL HIGH (ref 5–15)
BUN: 94 mg/dL — ABNORMAL HIGH (ref 8–23)
CO2: 12 mmol/L — ABNORMAL LOW (ref 22–32)
Calcium: 8.1 mg/dL — ABNORMAL LOW (ref 8.9–10.3)
Chloride: 119 mmol/L — ABNORMAL HIGH (ref 98–111)
Creatinine, Ser: 11.51 mg/dL — ABNORMAL HIGH (ref 0.61–1.24)
GFR calc Af Amer: 5 mL/min — ABNORMAL LOW (ref >60)
GFR calc non Af Amer: 4 mL/min — ABNORMAL LOW (ref >60)
Glucose, Bld: 68 mg/dL — ABNORMAL LOW (ref 70–99)
Phosphorus: 7.6 mg/dL — ABNORMAL HIGH (ref 2.5–4.6)
Potassium: 4.5 mmol/L (ref 3.5–5.1)
Sodium: 148 mmol/L — ABNORMAL HIGH (ref 135–145)

## 2020-04-13 LAB — CD4/CD8 (T-HELPER/T-SUPPRESSOR CELL)
CD4 absolute: 39 /uL — ABNORMAL LOW (ref 400–1790)
CD4%: 6 % — ABNORMAL LOW (ref 33–65)
CD8 T Cell Abs: 415 /uL (ref 190–1000)
CD8tox: 62 % — ABNORMAL HIGH (ref 12–40)
Ratio: 0.1 — ABNORMAL LOW (ref 1.0–3.0)
Total lymphocyte count: 668 /uL — ABNORMAL LOW (ref 1000–4000)

## 2020-04-13 LAB — MAGNESIUM: Magnesium: 1.6 mg/dL — ABNORMAL LOW (ref 1.7–2.4)

## 2020-04-13 LAB — CRYPTOCOCCAL ANTIGEN: Crypto Ag: NEGATIVE

## 2020-04-13 LAB — PHOSPHORUS: Phosphorus: 8.3 mg/dL — ABNORMAL HIGH (ref 2.5–4.6)

## 2020-04-13 LAB — HEPATITIS B SURFACE ANTIGEN: Hepatitis B Surface Ag: NONREACTIVE

## 2020-04-13 LAB — PROTIME-INR
INR: 1.1 (ref 0.8–1.2)
Prothrombin Time: 13.5 seconds (ref 11.4–15.2)

## 2020-04-13 LAB — FERRITIN: Ferritin: 1130 ng/mL — ABNORMAL HIGH (ref 24–336)

## 2020-04-13 LAB — HIV-1 RNA QUANT-NO REFLEX-BLD
HIV 1 RNA Quant: 624000 copies/mL
LOG10 HIV-1 RNA: 5.795 log10copy/mL

## 2020-04-13 MED ORDER — LIDOCAINE HCL 1 % IJ SOLN
INTRAMUSCULAR | Status: AC
  Filled 2020-04-13: qty 20

## 2020-04-13 MED ORDER — CEFAZOLIN SODIUM-DEXTROSE 2-4 GM/100ML-% IV SOLN
INTRAVENOUS | Status: AC
  Filled 2020-04-13: qty 100

## 2020-04-13 MED ORDER — GLUCERNA 1.5 CAL PO LIQD
1000.0000 mL | ORAL | Status: DC
  Administered 2020-04-13: 1000 mL
  Filled 2020-04-13 (×2): qty 1000

## 2020-04-13 MED ORDER — LABETALOL HCL 5 MG/ML IV SOLN: 0.5000 mg/min | Status: DC

## 2020-04-13 MED ORDER — MIDAZOLAM HCL 2 MG/2ML IJ SOLN
INTRAMUSCULAR | Status: AC | PRN
  Administered 2020-04-13: 1 mg via INTRAVENOUS

## 2020-04-13 MED ORDER — LIDOCAINE HCL 1 % IJ SOLN
INTRAMUSCULAR | Status: AC | PRN
  Administered 2020-04-13: 10 mL

## 2020-04-13 MED ORDER — DEXTROSE 50 % IV SOLN
12.5000 g | INTRAVENOUS | Status: AC
  Administered 2020-04-13: 12.5 g via INTRAVENOUS
  Filled 2020-04-13: qty 50

## 2020-04-13 MED ORDER — DEXTROSE 5 % IV SOLN: INTRAVENOUS | Status: AC

## 2020-04-13 MED ORDER — DARBEPOETIN ALFA 40 MCG/0.4ML IJ SOSY: 40.0000 ug | PREFILLED_SYRINGE | INTRAMUSCULAR | Status: DC

## 2020-04-13 MED ORDER — FENTANYL CITRATE (PF) 100 MCG/2ML IJ SOLN
INTRAMUSCULAR | Status: AC
  Filled 2020-04-13: qty 2

## 2020-04-13 MED ORDER — HEPARIN SODIUM (PORCINE) 1000 UNIT/ML IJ SOLN
INTRAMUSCULAR | Status: AC
  Administered 2020-04-14: 1000 [IU]
  Filled 2020-04-13: qty 1

## 2020-04-13 MED ORDER — MIDAZOLAM HCL 2 MG/2ML IJ SOLN
INTRAMUSCULAR | Status: AC
  Filled 2020-04-13: qty 2

## 2020-04-13 MED ORDER — INSULIN ASPART 100 UNIT/ML ~~LOC~~ SOLN
0.0000 [IU] | SUBCUTANEOUS | Status: DC
  Administered 2020-04-17: 1 [IU] via SUBCUTANEOUS
  Administered 2020-04-18: 2 [IU] via SUBCUTANEOUS
  Administered 2020-04-18: 1 [IU] via SUBCUTANEOUS
  Administered 2020-04-18: 2 [IU] via SUBCUTANEOUS
  Administered 2020-04-19: 5 [IU] via SUBCUTANEOUS
  Administered 2020-04-19: 1 [IU] via SUBCUTANEOUS

## 2020-04-13 MED ORDER — SODIUM CHLORIDE 0.9 % IV SOLN
50.0000 mg | Freq: Two times a day (BID) | INTRAVENOUS | Status: DC
  Administered 2020-04-13 – 2020-04-14 (×4): 50 mg via INTRAVENOUS
  Filled 2020-04-13 (×7): qty 5

## 2020-04-13 NOTE — Progress Notes (Signed)
[name] Progress Note    Assessment/ Plan:   1.  Progressive CKD stage V now ESRD.  Underlying CKD thought to be secondary to uncontrolled diabetes and hypertensive arteriosclerosis along with nonadherence/noncompliance. -Open to have a tunneled dialysis catheter to be placed with IR today and will initiate slow start protocol with hemodialysis -Avoid nephrotoxic medications including NSAIDs and iodinated intravenous contrast exposure unless the latter is absolutely indicated.  Preferred narcotic agents for pain control are hydromorphone, fentanyl, and methadone. Morphine should not be used. Avoid Baclofen and avoid oral sodium phosphate and magnesium citrate based laxatives / bowel preps. Continue strict Input and Output monitoring. Will monitor the patient closely with you and intervene or adjust therapy as indicated by changes in clinical status/labs  2.  Seizure disorder, metabolic encephalopathy (noncompliant with medications) -Neurology on board, will initiate hemodialysis to see if this helps 3.  Hypertensive urgency on admission.  Receiving IV antihypertensives in setting of him being n.p.o. 4.  Secondary hyperparathyroidism -PTH ordered 5.  Anemia of chronic kidney disease -will start ESA (ferritin 1130) start with Aranesp 40 mcg q. weekly (first dose today)  [name], MD [facility]  Subjective:   No acute events.  Open for tunneled dialysis catheter to initiate hemodialysis.  No changes in mental status   Objective:   BP (!) 165/87 (BP Location: Left Arm)    Pulse (!) 111    Temp 98 F (36.7 C) (Oral)    Resp (!) 25    SpO2 100%   Intake/Output Summary (Last 24 hours) at 04/13/2020 1301 Last data filed at 04/13/2020 0400 Gross per 24 hour  Intake 289.52 ml  Output 475 ml  Net -185.48 ml   Weight change:   Physical Exam: Gen: Ill-appearing, not following commands CVS: S1-S2, no murmurs/rubs/gallops Resp: Clear to auscultation  bilaterally,no w/r/r/c, unlabored, bl chest expansion HXT:AVWP, nt/nd Ext:no edema Neuro: right gaze, right UE contracted, no response to verbal/tactile stimuli, not following commands  Imaging: [name]  Result Date: 04/12/2020 CLINICAL DATA:  Neuro deficit, acute, stroke suspected; concern for seizure versus stroke. EXAM: MRI HEAD WITHOUT CONTRAST TECHNIQUE: Multiplanar, multiecho pulse sequences of the brain and surrounding structures were obtained without intravenous contrast. COMPARISON:  Head CT 04/12/2020, head CT 04/10/2020, brain MRI 02/13/2019 FINDINGS: Brain: Mild intermittent motion degradation. Stable, mild generalized parenchymal atrophy. 2 mm focus of subtle restricted diffusion within the left parietal periventricular white matter consistent with small acute or early subacute infarct (series 4, image 11). There is no convincing evidence of acute infarct elsewhere within the brain. Stable moderate to advanced multifocal T2/FLAIR hyperintensity within the cerebral white matter which is nonspecific, but consistent with chronic small vessel ischemic disease. To a lesser degree, chronic small vessel ischemic changes are present within the pons. Redemonstrated chronic small-vessel infarct within the left corona radiata/basal ganglia. Small chronic lacunar infarcts also redemonstrated within the posterior right lentiform nucleus, thalami and left cerebellum. As before, there are few scattered chronic microhemorrhages within the basal ganglia, left thalamus and left cerebellum. This likely reflect sequela of chronic hypertensive microangiopathy. The hippocampi are symmetric in size and signal. No evidence of intracranial mass. No extra-axial fluid collection. No midline shift. Unchanged small arachnoid cyst overlying the anterior left frontal lobe. Vascular: Expected proximal arterial flow voids. Skull and upper cervical spine: No focal marrow lesion. Sinuses/Orbits: Visualized orbits  show no acute finding. Paranasal sinus mucosal thickening. Most notably, there is severe left frontal sinus mucosal thickening and  moderate mucosal thickening within the ethmoid, sphenoid and maxillary sinuses. Trace fluid within left mastoid air cells. Other: Nonspecific cystic lesion in along the left external ear measuring 7 mm (series 6, image 1) IMPRESSION: Mildly motion degraded examination. 2 mm acute or early subacute infarct within the left parietal lobe periatrial white matter. Stable moderate to advanced chronic small vessel ischemic disease. Redemonstrated chronic small-vessel infarcts within the left corona radiata/basal ganglia, right basal ganglia, thalami and left cerebellum. Stable, mild generalized parenchymal atrophy. 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 Pansinusitis. Trace left mastoid effusion. Nonspecific 7 mm cystic appearing lesion along the left external ear. Direct visualization recommended. Electronically Signed   By: Kellie Simmering DO   On: 04/12/2020 12:10   US RENAL  Result Date: 04/11/2020 CLINICAL DATA:  Elevated creatinine. EXAM: RENAL / URINARY TRACT ULTRASOUND COMPLETE COMPARISON:  06/24/2019 and CT abdomen pelvis 09/03/2017 FINDINGS: Right Kidney: Renal measurements: 10.0 x 5.4 x 3.3 centimeters = volume: 93.1 mL. Renal parenchyma is heterogeneous and echogenic. Cyst previously identified in the UPPER pole region RIGHT kidney is not well seen. Suspect small parapelvic cyst. Left Kidney: Renal measurements: 9.2 x 4.2 x 3.5 centimeters = volume: 10.5 mL. Renal parenchyma is heterogeneous and echogenic. Bladder: Appears normal for degree of bladder distention. Other: None. IMPRESSION: No hydronephrosis. Heterogeneous echogenic renal parenchyma bilaterally. Electronically Signed   By: Nolon Nations M.D.   On: 04/11/2020 18:33   Overnight EEG with video  Result Date: 04/13/2020 Lora Havens, MD     04/13/2020 10:29 AM .Patient Name: Richard [name] MRN: [id number] Epilepsy Attending: Lora Havens Referring Physician/Provider: Dr Donnetta Simpers Duration: 04/12/2020 1255 to 04/13/2020 1000  Patient history: 64yo M with forced right gaze deviation concerning for seizure. EEG to evaluate for seizure.  Level of alertness: Awake, asleep  AEDs during EEG study: LEV, vimpat  Technical aspects: This EEG study was done with scalp electrodes positioned according to the 10-20 International system of electrode placement. Electrical activity was acquired at a sampling rate of 500Hz  and reviewed with a high frequency filter of 70Hz  and a low frequency filter of 1Hz . EEG data were recorded continuously and digitally stored.  Description: No posterior dominant rhythm was seen. Sleep was characterized by vertex waves, sleep spindles (12 to 14 Hz), maximal frontocentral region.  EEG showed continuous generalized rhythmic 3 to 6 Hz theta-delta slowing. Hyperventilation and photic stimulation were not performed.    ABNORMALITY -Continuous rhythmic slow, generalized  IMPRESSION: This study is suggestive of moderate diffuse encephalopathy, nonspecific etiology. No seizures or epileptiform discharges were seen throughout the recording.  Priyanka Barbra Sarks   EEG LTVM - Continuous Bedside W/ Video Includes Portable EEG Read  Result Date: 04/12/2020 Lora Havens, MD     04/12/2020  1:04 PM Patient Name: Jeshua Ransford MRN: [id number] Epilepsy Attending: Lora Havens Referring Physician/Provider: Dr Donnetta Simpers Date: 04/12/2020 Duration: 25.21 mins Patient history: 64yo M with forced right gaze deviation concerning for seizure. EEG to evaluate for seizure. Level of alertness: Awake, asleep AEDs during EEG study: LEV, vimpat Technical aspects: This EEG study was done with scalp electrodes positioned according to the 10-20 International system of electrode placement. Electrical  activity was acquired at a sampling rate of 500Hz  and reviewed with a high frequency filter of 70Hz  and a low frequency filter of 1Hz . EEG data were recorded continuously and digitally stored. Description: No posterior dominant rhythm was seen. Sleep was characterized  by vertex waves, sleep spindles (12 to 14 Hz), maximal frontocentral region.  EEG showed continuous generalized rhythmic 3 to 6 Hz theta-delta slowing. Hyperventilation and photic stimulation were not performed.   ABNORMALITY -Continuous rhythmic slow, generalized IMPRESSION: This study is suggestive of moderate diffuse encephalopathy, nonspecific etiology. No seizures or epileptiform discharges were seen throughout the recording. Lora Havens   CT HEAD CODE STROKE WO CONTRAST  Addendum Date: 04/12/2020   ADDENDUM REPORT: 04/12/2020 10:49 ADDENDUM: These results were called by telephone at the time of interpretation on 04/12/2020 at 10:49 am to provider Sal, who verbally acknowledged these results. Electronically Signed   By: Franchot Gallo M.D.   On: 04/12/2020 10:49   Result Date: 04/12/2020 CLINICAL DATA:  Code stroke. Acute neuro deficit. Rule out stroke. Right-sided gaze preference. EXAM: CT HEAD WITHOUT CONTRAST TECHNIQUE: Contiguous axial images were obtained from the base of the skull through the vertex without intravenous contrast. COMPARISON:  CT head 04/10/2020.  MRI head 02/13/2020 FINDINGS: Brain: Generalized atrophy. Prominent white matter hypodensity bilaterally similar to prior studies compatible with chronic microvascular ischemia. Findings stable compared with the prior study. Negative for acute hemorrhage or mass. Vascular: Negative for hyperdense vessel Skull: No focal skeletal lesion. Sinuses/Orbits: Moderate to extensive mucosal edema paranasal sinuses. Negative orbit Other: None ASPECTS ([place] Stroke Program Early CT Score) - Ganglionic level infarction (caudate, lentiform nuclei, internal capsule, insula, M1-M3  cortex): 7 - Supraganglionic infarction (M4-M6 cortex): 3 Total score (0-10 with 10 being normal): 10 IMPRESSION: 1. Extensive white matter changes consistent with chronic microvascular ischemia. This is stable from prior studies. No acute abnormality. 2. ASPECTS is 10 Electronically Signed: By: Franchot Gallo M.D. On: 04/12/2020 10:40    Labs: BMET Recent Labs  Lab 04/10/20 2037 04/11/20 0018 04/11/20 0402 04/12/20 0434 04/13/20 1113  NA 143  --  145 143 148*  K 4.2  --  4.4 4.4 4.5  CL 115*  --  117* 116* 119*  CO2 13*  --  11* 13* 12*  GLUCOSE 106*  --  94 88 68*  BUN 76*  --  78* 86* 94*  CREATININE 9.98* 9.99* 9.99* 10.66* 11.51*  CALCIUM 7.5*  --  7.6* 7.5* 8.1*  PHOS  --   --   --   --  7.6*   CBC Recent Labs  Lab 04/10/20 2037 04/11/20 0018 04/11/20 0402 04/12/20 0434  WBC 6.6 6.8 9.1 6.5  NEUTROABS 4.7  --   --  5.0  HGB 10.3* 11.4* 9.7* 8.2*  HCT 31.5* 34.5* 29.4* 24.5*  MCV 90.0 87.6 89.4 88.1  PLT 192 201 195 173    Medications:     Chlorhexidine Gluconate Cloth  6 each Topical Q0600   [START ON 04/14/2020] clopidogrel  75 mg Oral Daily   [START ON 04/14/2020] heparin  5,000 Units Subcutaneous Q8H   LORazepam  2 mg Intravenous Once   pantoprazole  40 mg Oral Daily   pravastatin  80 mg Oral QHS   tamsulosin  0.4 mg Oral Daily      [name], MD [facility] 04/13/2020, 1:01 PM

## 2020-04-13 NOTE — Sedation Documentation (Signed)
Vital signs stable. 

## 2020-04-13 NOTE — Progress Notes (Signed)
Pt during morning assessment was responsive to voice and partially open eyes. Pupils 55mm PERRLA Bilaterally. Right gaze preference to midline drift. Pt did grunt in response to alert question for self. BP remained elevated to meet PRN medication parameters and MD was made aware. MD placed new orders. Critical care team at bedside to assess patient.

## 2020-04-13 NOTE — Progress Notes (Signed)
Upper extremity mapping has been completed.   Preliminary results in CV Proc.   Abram Sander 04/13/2020 3:09 PM

## 2020-04-13 NOTE — Progress Notes (Addendum)
  Speech Language Pathology Treatment: Dysphagia  Patient Details Name: Richard [name] MRN: [id number] DOB: 1956/03/31 Today's Date: 04/13/2020 Time: 2518-9842 SLP Time Calculation (min) (ACUTE ONLY): 11 min  Assessment / Plan / Recommendation Clinical Impression  Pt was seen for dysphagia treatment to assess improvement in swallow function compared to yesterday. He accepted boluses and demonstrated some labial movement around a presented spoon. However, bolus manipulation and A-P transport were absent despite verbal and tactile cues. No volitional swallow was noted even with trace thin liquids following oral care. Pt's swallow function is notably worse than the initial evaluation on 7/21 but slightly improved compared to yesterday considering that he did accept some boluses today. Still, he does not present as a candidate for safe oral intake at this time. It is recommended that his NPO status be maintained and that short-term non-oral alimentation (e.g., Cortrak/NG tube) be initiated. Minette Brine, RN and Dr. Dillard Cannon have been advised of pt's performance and recommendations.    HPI HPI: Pt is a 64 y.o. male, with medical history significant of diabetes, osteoarthritis, hypertension, CKD 5, neuropathies, previous CVA with residual right-sided weakness, migraine headaches , patient with extremely poor compliance, he has refused hemodialysis access in the past, who presented to ED via EMS status post seizures witnessed by roommate. In ED patient was postictal, he was loaded with Keppra. CT head negative. BSE 02/14/20: Pt had a little throat clearing initially while drinking thin liquids in a suboptimal position, but once repositioned more fully upright, no further s/s of aspiration were noted. 04/11/20 pt without s/s aspiration; placed on regular/thin. 7/22 change in status; MD paged. Made NPO. To undergo further testing. MRI 7/22: 2 mm acute or early subacute infarct within the left parietal lobe periatrial  white matter.      SLP Plan  Continue with current plan of care       Recommendations  Diet recommendations: NPO Medication Administration: Via alternative means                Oral Care Recommendations: Oral care QID Follow up Recommendations:  (TBD) SLP Visit Diagnosis: Dysphagia, unspecified (R13.10) Plan: Continue with current plan of care       Dontavius Keim I. Hardin [name], [place], [place] Office number [phone] Pager [place] 04/13/2020, 1:05 PM

## 2020-04-13 NOTE — Progress Notes (Signed)
Prior to bedside report pt stated that he wanted a ginger ale to drink RN explained purpose for coretrak. Pt stated he understood and went back to sleep.

## 2020-04-13 NOTE — Progress Notes (Signed)
LTM EEG Headbox reconnected by nursing staff and running well .

## 2020-04-13 NOTE — Progress Notes (Signed)
Contacted on call MD, B. Chotiner, advised pt last couple of bp's taken on left arm were 194/100,  204/98 and all prn's had been given. Took bp on left leg 161/70. MD requested to give it some time and do manual bp because there were no other bp's on the leg to compare that one to. MD also advised the patients HR and RR are increased as well.

## 2020-04-13 NOTE — Progress Notes (Addendum)
PROGRESS NOTE    Richard [name]  OZD:[id number] DOB: 09-28-1955 DOA: 04/10/2020 PCP: [name], MD    Chief Complaint  Patient presents with  . Seizures    Brief Narrative:  Richard [name]  is a 64 y.o. male, with medical history significant ofdiabetes, osteoarthritis, hypertension,CKD 5,neuropathies, previous CVA with residual right-sided weakness, migraine headaches , patient with extremely poor compliance, he has refused hemodialysis access in the past, presents to ED via EMS status post seizures, he is known history of seizures, but has not been noncompliant with his seizure medications, seizures was witnessed by his roommate, seizures lasted for 4 minutes, she will patient's extremely encephalopathic, unable to provide any history, but upon my evaluation is more appropriate, patient report actually he never got his seizure medications, was not taking it, patient denies any fever, chest pain, shortness of breath, no dysuria or polyuria, reports usually ambulates with a walker, he has right-sided weakness at baseline. -in ED patient was postictal, he was loaded with Keppra, ED discussed with neurology who recommended to resume his home dose Keppra 500 mg p.o. twice daily, as well patient was noted to be hypertensive 230/118, patient received IV labetalol, IV hydralazine, as well patient with significant labs abnormalities which appears to be at baseline, including creatinine of 9.98, BUN of 76, baseline anemia with hemoglobin of 10.3, hospitalist were requested to admit  Subjective:  Patient is on continuous EEG , no improvement in mental status , he is not awake , not interactive , he is not demonstrating any awareness of boluses with speech pathologist, he is n.p.o. So far his maintaining airway however his mental status seems continue to deteriorate. No jerky movement.    Assessment & Plan:   Active Problems:   Tobacco abuse   Protein-calorie malnutrition, mild  (HCC)   Hyperlipidemia   Neuropathy in diabetes (HCC)   GERD (gastroesophageal reflux disease)   Diabetes mellitus, type II, insulin dependent (HCC)   CKD (chronic kidney disease) stage 5, GFR less than 15 ml/min (HCC)   History of cerebrovascular accident (CVA) with residual deficit   Seizure ([place])   Acute metabolic encephalopathy, likely multifactorial including seizure, hypertension, acute stroke, uremia, and possible CNS infection. -Initial CT scan on admission no acute findings -Due to acute mental status change on July 22 a.m. ,code stroke initiated, stat CT scan no acute findings, stat MRI showed "2 mm acute or early subacute infarct within the left parietal lobe periatrial white matter" --on continuous EEG  -HIV screening positive, neurology to perform LP today to rule out crypto meningitis -will place cor track for nutrition  and medication, his mental status is deteriorating, although currently he is maintaining airway , I worry   that he may continue deteriorate and need airway protection , case discussed with critical care   Recurrent seizures, present on admission -He is noncompliant with his seizure medication -He is also uremic, and has  possible underline CNS infection. 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 -His BUN is 76 -No edema, still makes some  urine -Patient has refused dialysis in the past,  he is agreeable to dialysis on presentation -Nephrology consulted, IR to place dialysis catheter and start dialysis today  HIV : New diagnosis, ID on board  Insulin-dependent DM2, controlled -Is on Novolin 70/30 at home -Has hypoglycemia this morning, start hypoglycemic protocol, start D5 infusion, continue SSI.  HLD -Currently n.p.o.  Tobacco use -Counseled to quit using this.   history of severe noncompliance -He was counseled.    DVT prophylaxis: heparin injection 5,000 Units Start: 04/14/20 0600 SCDs Start: 04/10/20 2332   Code Status: Full Family Communication: Attempt to call daughter x3, not able to reach her, I left a message. Disposition:   Status is: Inpatient    Dispo: The patient is from: Home, he lives with a roommate              Anticipated d/c is to: To be determined              Anticipated d/c date is: To be determined              Patient currently having acute stroke, needs to start dialysis  Consultants:  nephrology  Neurology  Procedures:   None  Antimicrobials:   None     Objective: Vitals:   04/13/20 0318 04/13/20 0323 04/13/20 0331 04/13/20 0425  BP: (!) 194/100 (!) 204/98 (!) 161/70 (!) 180/90  Pulse: (!) 106 105 (!) 110 (!) 117  Resp: 23 (!) 25 (!) 24 (!) 28  Temp: 99.2 F (37.3 C)     TempSrc: Axillary     SpO2: 96% 99% 99% 100%    Intake/Output Summary (Last 24 hours) at 04/13/2020 0710 Last data filed at 04/13/2020 0400 Gross per 24 hour  Intake 289.52 ml  Output 475 ml  Net -185.48 ml   There were no vitals filed for this visit.  Examination:  General exam: Not open eyes, not following command, does not respond to sternal rub, maintaining airway spontaneously Respiratory system: Clear to auscultation. Respiratory effort normal. Cardiovascular system: S1 & S2 heard, RRR. No JVD, no murmur, No pedal edema. Gastrointestinal system: Abdomen is nondistended,  soft and nontender. No organomegaly or masses felt. Normal bowel sounds heard. Central nervous system: Unresponsive Extremities: Chronic unresponsive, no spontaneous movement Skin: No rashes, lesions or ulcers Psychiatry: not able to access     Data Reviewed: I have personally reviewed following labs and imaging studies  CBC: Recent Labs  Lab 04/10/20 2037 04/11/20 0018 04/11/20 0402 04/12/20 0434  WBC 6.6 6.8 9.1 6.5  NEUTROABS 4.7  --   --  5.0  HGB 10.3* 11.4* 9.7* 8.2*  HCT 31.5* 34.5* 29.4* 24.5*  MCV 90.0 87.6 89.4 88.1  PLT 192 201 195 027    Basic Metabolic Panel: Recent Labs  Lab 04/10/20 2037 04/11/20 0018 04/11/20 0402 04/12/20 0434  NA 143  --  145 143  K 4.2  --  4.4 4.4  CL 115*  --  117* 116*  CO2 13*  --  11* 13*  GLUCOSE 106*  --  94 88  BUN 76*  --  78* 86*  CREATININE 9.98* 9.99* 9.99* 10.66*  CALCIUM 7.5*  --  7.6* 7.5*    GFR: CrCl cannot be calculated (Unknown ideal weight.).  Liver Function Tests: Recent Labs  Lab 04/10/20 2037  AST 13*  ALT  10  ALKPHOS 95  BILITOT 0.5  PROT 6.6  ALBUMIN 2.8*    CBG: Recent Labs  Lab 04/10/20 2031 04/11/20 1620 04/12/20 1006  GLUCAP 91 115* 83     Recent Results (from the past 240 hour(s))  SARS Coronavirus 2 by RT PCR (hospital order, performed in [facility] hospital lab) Nasopharyngeal Nasopharyngeal Swab     Status: None   Collection Time: 04/11/20  3:58 AM   Specimen: Nasopharyngeal Swab  Result Value Ref Range Status   SARS Coronavirus 2 NEGATIVE NEGATIVE Final    Comment: (NOTE) SARS-CoV-2 target nucleic acids are NOT DETECTED.  The SARS-CoV-2 RNA is generally detectable in upper and lower respiratory specimens during the acute phase of infection. The lowest concentration of SARS-CoV-2 viral copies this assay can detect is 250 copies / mL. A negative result does not preclude SARS-CoV-2 infection and should not be used as the sole basis for treatment or other patient  management decisions.  A negative result may occur with improper specimen collection / handling, submission of specimen other than nasopharyngeal swab, presence of viral mutation(s) within the areas targeted by this assay, and inadequate number of viral copies (<250 copies / mL). A negative result must be combined with clinical observations, patient history, and epidemiological information.  Fact Sheet for Patients:   StrictlyIdeas.no  Fact Sheet for Healthcare Providers: BankingDealers.co.za  This test is not yet approved or  cleared by the [place] FDA and has been authorized for detection and/or diagnosis of SARS-CoV-2 by FDA under an Emergency Use Authorization (EUA).  This EUA will remain in effect (meaning this test can be used) for the duration of the COVID-19 declaration under Section 564(b)(1) of the Act, 21 U.S.C. section 360bbb-3(b)(1), unless the authorization is terminated or revoked sooner.  Performed at [facility], [place] [address] [place], [place] [address]          Radiology Studies: [name]  Result Date: 04/12/2020 CLINICAL DATA:  Neuro deficit, acute, stroke suspected; concern for seizure versus stroke. EXAM: MRI HEAD WITHOUT CONTRAST TECHNIQUE: Multiplanar, multiecho pulse sequences of the brain and surrounding structures were obtained without intravenous contrast. COMPARISON:  Head CT 04/12/2020, head CT 04/10/2020, brain MRI 02/13/2019 FINDINGS: Brain: Mild intermittent motion degradation. Stable, mild generalized parenchymal atrophy. 2 mm focus of subtle restricted diffusion within the left parietal periventricular white matter consistent with small acute or early subacute infarct (series 4, image 11). There is no convincing evidence of acute infarct elsewhere within the brain. Stable moderate to advanced multifocal T2/FLAIR hyperintensity within the cerebral white matter which is  nonspecific, but consistent with chronic small vessel ischemic disease. To a lesser degree, chronic small vessel ischemic changes are present within the pons. Redemonstrated chronic small-vessel infarct within the left corona radiata/basal ganglia. Small chronic lacunar infarcts also redemonstrated within the posterior right lentiform nucleus, thalami and left cerebellum. As before, there are few scattered chronic microhemorrhages within the basal ganglia, left thalamus and left cerebellum. This likely reflect sequela of chronic hypertensive microangiopathy. The hippocampi are symmetric in size and signal. No evidence of intracranial mass. No extra-axial fluid collection. No midline shift. Unchanged small arachnoid cyst overlying the anterior left frontal lobe. Vascular: Expected proximal arterial flow voids. Skull and upper cervical spine: No focal marrow lesion. Sinuses/Orbits: Visualized orbits show no acute finding. Paranasal sinus mucosal thickening. Most notably, there is severe left frontal sinus mucosal thickening and moderate mucosal thickening within the ethmoid, sphenoid and maxillary sinuses. Trace  fluid within left mastoid air cells. Other: Nonspecific cystic lesion in along the left external ear measuring 7 mm (series 6, image 1) IMPRESSION: Mildly motion degraded examination. 2 mm acute or early subacute infarct within the left parietal lobe periatrial white matter. Stable moderate to advanced chronic small vessel ischemic disease. Redemonstrated chronic small-vessel infarcts within the left corona radiata/basal ganglia, right basal ganglia, thalami and left cerebellum. Stable, mild generalized parenchymal atrophy. 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 Pansinusitis. Trace left mastoid effusion. Nonspecific 7 mm cystic appearing lesion along the left external ear. Direct visualization recommended. Electronically  Signed   By: Kellie Simmering DO   On: 04/12/2020 12:10   US RENAL  Result Date: 04/11/2020 CLINICAL DATA:  Elevated creatinine. EXAM: RENAL / URINARY TRACT ULTRASOUND COMPLETE COMPARISON:  06/24/2019 and CT abdomen pelvis 09/03/2017 FINDINGS: Right Kidney: Renal measurements: 10.0 x 5.4 x 3.3 centimeters = volume: 93.1 mL. Renal parenchyma is heterogeneous and echogenic. Cyst previously identified in the UPPER pole region RIGHT kidney is not well seen. Suspect small parapelvic cyst. Left Kidney: Renal measurements: 9.2 x 4.2 x 3.5 centimeters = volume: 10.5 mL. Renal parenchyma is heterogeneous and echogenic. Bladder: Appears normal for degree of bladder distention. Other: None. IMPRESSION: No hydronephrosis. Heterogeneous echogenic renal parenchyma bilaterally. Electronically Signed   By: Nolon Nations M.D.   On: 04/11/2020 18:33   EEG LTVM - Continuous Bedside W/ Video Includes Portable EEG Read  Result Date: 04/12/2020 Lora Havens, MD     04/12/2020  1:04 PM Patient Name: Malikah Lakey MRN: [id number] Epilepsy Attending: Lora Havens Referring Physician/Provider: Dr Donnetta Simpers Date: 04/12/2020 Duration: 25.21 mins Patient history: 64yo M with forced right gaze deviation concerning for seizure. EEG to evaluate for seizure. Level of alertness: Awake, asleep AEDs during EEG study: LEV, vimpat Technical aspects: This EEG study was done with scalp electrodes positioned according to the 10-20 International system of electrode placement. Electrical activity was acquired at a sampling rate of 500Hz  and reviewed with a high frequency filter of 70Hz  and a low frequency filter of 1Hz . EEG data were recorded continuously and digitally stored. Description: No posterior dominant rhythm was seen. Sleep was characterized by vertex waves, sleep spindles (12 to 14 Hz), maximal frontocentral region.  EEG showed continuous generalized rhythmic 3 to 6 Hz theta-delta slowing. Hyperventilation and photic  stimulation were not performed.   ABNORMALITY -Continuous rhythmic slow, generalized IMPRESSION: This study is suggestive of moderate diffuse encephalopathy, nonspecific etiology. No seizures or epileptiform discharges were seen throughout the recording. Lora Havens   CT HEAD CODE STROKE WO CONTRAST  Addendum Date: 04/12/2020   ADDENDUM REPORT: 04/12/2020 10:49 ADDENDUM: These results were called by telephone at the time of interpretation on 04/12/2020 at 10:49 am to provider Sal, who verbally acknowledged these results. Electronically Signed   By: Franchot Gallo M.D.   On: 04/12/2020 10:49   Result Date: 04/12/2020 CLINICAL DATA:  Code stroke. Acute neuro deficit. Rule out stroke. Right-sided gaze preference. EXAM: CT HEAD WITHOUT CONTRAST TECHNIQUE: Contiguous axial images were obtained from the base of the skull through the vertex without intravenous contrast. COMPARISON:  CT head 04/10/2020.  MRI head 02/13/2020 FINDINGS: Brain: Generalized atrophy. Prominent white matter hypodensity bilaterally similar to prior studies compatible with chronic microvascular ischemia. Findings stable compared with the prior study. Negative for acute hemorrhage or mass. Vascular: Negative for hyperdense vessel Skull: No focal skeletal lesion. Sinuses/Orbits: Moderate to extensive mucosal edema  paranasal sinuses. Negative orbit Other: None ASPECTS ([place] Stroke Program Early CT Score) - Ganglionic level infarction (caudate, lentiform nuclei, internal capsule, insula, M1-M3 cortex): 7 - Supraganglionic infarction (M4-M6 cortex): 3 Total score (0-10 with 10 being normal): 10 IMPRESSION: 1. Extensive white matter changes consistent with chronic microvascular ischemia. This is stable from prior studies. No acute abnormality. 2. ASPECTS is 10 Electronically Signed: By: Franchot Gallo M.D. On: 04/12/2020 10:40        Scheduled Meds: . Chlorhexidine Gluconate Cloth  6 each Topical Q0600  . [START ON 04/14/2020]  clopidogrel  75 mg Oral Daily  . [START ON 04/14/2020] heparin  5,000 Units Subcutaneous Q8H  . LORazepam  2 mg Intravenous Once  . pantoprazole  40 mg Oral Daily  . pravastatin  80 mg Oral QHS  . tamsulosin  0.4 mg Oral Daily   Continuous Infusions: . lacosamide (VIMPAT) IV 100 mg (04/12/20 2159)  . levETIRAcetam 500 mg (04/12/20 2300)  . nitroGLYCERIN Stopped (04/12/20 1049)     LOS: 3 days      Labs: Basic Metabolic Panel: Recent Labs  Lab 04/10/20 2037 04/11/20 0018 04/11/20 0402 04/12/20 0434 04/13/20 1113  NA 143  --  145 143 148*  K 4.2  --  4.4 4.4 4.5  CL 115*  --  117* 116* 119*  CO2 13*  --  11* 13* 12*  GLUCOSE 106*  --  94 88 68*  BUN 76*  --  78* 86* 94*  CREATININE 9.98* 9.99* 9.99* 10.66* 11.51*  CALCIUM 7.5*  --  7.6* 7.5* 8.1*  PHOS  --   --   --   --  7.6*   Liver Function Tests: Recent Labs  Lab 04/10/20 2037 04/13/20 1113  AST 13*  --   ALT 10  --   ALKPHOS 95  --   BILITOT 0.5  --   PROT 6.6  --   ALBUMIN 2.8* 2.3*   No results for input(s): LIPASE, AMYLASE in the last 168 hours. No results for input(s): AMMONIA in the last 168 hours. CBC: Recent Labs  Lab 04/10/20 2037 04/11/20 0018 04/11/20 0402 04/12/20 0434  WBC 6.6 6.8 9.1 6.5  NEUTROABS 4.7  --   --  5.0  HGB 10.3* 11.4* 9.7* 8.2*  HCT 31.5* 34.5* 29.4* 24.5*  MCV 90.0 87.6 89.4 88.1  PLT 192 201 195 173   Cardiac Enzymes: No results for input(s): CKTOTAL, CKMB, CKMBINDEX, TROPONINI in the last 168 hours. BNP: BNP (last 3 results) No results for input(s): BNP in the last 8760 hours.  ProBNP (last 3 results) Recent Labs    06/22/19 1505  PROBNP 170.0*    CBG: Recent Labs  Lab 04/10/20 2031 04/11/20 1620 04/12/20 1006  GLUCAP 91 115* 83       Signed:  Florencia Reasons MD, PhD, FACP  [facility] 04/13/2020, 1:31 PM   Total Critical Care time in examining the patient bedside, evaluating Lab work and other data, over half of the total time was spent in  coordinating patient care on the floor or bedisde in talking to patient/family members, communicating with nursing Staff on the floor and sub specialists  to coordinate patients medical care and needs is 52  Minutes.  The condition which has caused critical injury/acute impairment of  vital organ system with a high probability of sudden clinically significant deterioration and can cause Potential Life threatening injury to this patient addressed unresponsive, worsening mental status, concerning for seizure, with new  stroke, concerning CNS infection with new diagnosis of HIV ,uremia /renal failure initiating dialysis   case discussed with nephrology, neurology, ID and critical care I have personally reviewed and interpreted on  04/13/2020 daily labs, tele strips, imagings as discussed above under date review session and assessment and plans.  I reviewed all nursing notes, pharmacy notes, consultant notes,  vitals, pertinent old records  I have discussed plan of care as described above with RN , patient on 04/13/2020  Voice Recognition /Dragon dictation system was used to create this note, attempts have been made to correct errors. Please contact the author with questions and/or clarifications.   Florencia Reasons, MD PhD FACP [facility]  Available via Epic secure chat 7am-7pm for nonurgent issues Please page for urgent issues To page the attending provider between 7A-7P or the covering provider during after hours 7P-7A, please log into the web site [email or web address] and access using universal [place] password for that web site. If you do not have the password, please call the hospital operator.    04/13/2020, 7:10 AM

## 2020-04-13 NOTE — Sedation Documentation (Signed)
Pt resting at this time without complaints

## 2020-04-13 NOTE — Progress Notes (Signed)
Called for pt to come to radiology. RN states that pt is on continuous EEG and will be overnight tonight. Informed PA Larene Beach, awaiting instructions.

## 2020-04-13 NOTE — Consult Note (Signed)
Date of Admission:  04/10/2020          Reason for Consult: HIVAIDS    Referring Provider: Terrilyn Saver "auto Consult" and Dr Erlinda Hong   Assessment:  1. 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 Seizures and infarct on imaging 3. Diffuse rash (both concerning for disseminated cryptococcal infection with meningitis 4. Poorly controlled diabetes mellitus 5. Poorly controlled hypertension 6. Chronic kidney disease now approaching need for hemodialysis  Plan:  1. He needs a lumbar puncture with documented opening pressure, large volume lumbar puncture taken with CSF sent for: Cryptococcal antigen, CSF protein glucose culture, VdrlRemaining CSF should be saved  Closing pressure should be lowered to less than 20  2. Serum crypto ag negative, RPR sent Hepatitis serologies 3. We cannot start antivirals until we have excluded and central nervous system opportunistic infection or treated it for at least 5 weeks, NOTE IF he has HIV encephalopathy however we NEED TO START ARV ASAP  4. I said his viral load has already come back at 600,000 copies CD4 count is 39.  HIV genotyping sent and hepatitis B serology being sent 5. 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 Chlorhexidine Gluconate Cloth  6 each Topical Q0600  . [START ON 04/14/2020] clopidogrel  75 mg Oral Daily  . darbepoetin (ARANESP) injection - DIALYSIS  40 mcg Intravenous Q Fri-HD  . [START ON 04/14/2020] heparin  5,000 Units Subcutaneous  Q8H  . insulin aspart  0-9 Units Subcutaneous Q4H  . LORazepam  2 mg Intravenous Once  . pantoprazole  40 mg Oral Daily  . pravastatin  80 mg Oral QHS  . tamsulosin  0.4 mg Oral Daily   Continuous Infusions: . dextrose 50 mL/hr at 04/13/20 1356  . lacosamide (VIMPAT) IV 50 mg (04/13/20 1035)  . levETIRAcetam 500 mg (04/13/20 1134)   PRN Meds:.hydrALAZINE, labetalol  HPI: Richard [name] is a 64 y.o. male chronic migraine headaches, history of seizure disorder poorly controlled diabetes chronic kidney disease now approaching the need for hemodialysis admitted with seizures.  Imaging disclosed an infarct in parietal lobe.  His HIV antibody done as part of routine admission labs was positive.  Confirmatory antibody is positive I had ordered a viral load when I seen his initial screening test positive it come back greater than 600,000 CD4 count is 39.  I am greatly concerned that he either has an opportunistic infection such as cryptococcal meningitis that is not being treated or that he has HIV encephalopathy.  He needs a lumbar puncture with a large volume of CSF taken  CSF should be sent for stat cryptococcal antigen cell count differential protein glucose, VDRL and culture CSF fungal culture with remaining CSF saved.  Closing pressure should be brought down below 20.  If he has cryptococcal meningitis will need initiation of amphotericin  (My understanding is he initiating on hemodialysis soon)  I would also recommend getting a pump punch biopsy  of his skin sending one specimen for AFB and fungal cultures culture and one for pathology.  If he does not have an opportunistic infection in his central nervous system I want to initiate antiretrovirals as soon as possible because he could have HIV encephalopathy.    Review of Systems: Review of Systems  Unable to perform ROS: Patient unresponsive    Past Medical History:  Diagnosis Date  . Arthritis   . Diabetes mellitus,  type II, insulin dependent ([place])   . GERD (gastroesophageal reflux disease)   . Hypertension   . Hypertensive emergency 05/21/2012  . Immune deficiency disorder ([employer])   . Migraines   . Neuropathy in diabetes ([place])    bilat feet  . Ruptured lumbar disc   . Stroke [facility]) 2013   residual right sided weakness (arm>leg)    Social History   Tobacco Use  . Smoking status: Current Every Day Smoker    Packs/day: 0.50    Types: Cigarettes  . Smokeless tobacco: Never Used  Vaping Use  . Vaping Use: Never used  Substance Use Topics  . Alcohol use: No  . Drug use: Yes    Types: Marijuana    Family History  Problem Relation Age of Onset  . Other Mother   . Stroke Father   . Hypertension Father   . Epilepsy Father   . Breast cancer Sister   . Stroke Brother   . Heart attack Brother   . Diabetes Sister    No Known Allergies  OBJECTIVE: Blood pressure (!) 165/87, pulse (!) 111, temperature 98 F (36.7 C), temperature source Oral, resp. rate (!) 25, SpO2 100 %.  Physical Exam Constitutional:      Appearance: He is ill-appearing.  HENT:     Head: Normocephalic and atraumatic.  Cardiovascular:     Rate and Rhythm: Normal rate and regular rhythm.     Heart sounds: No murmur heard.  No friction rub. No gallop.   Pulmonary:     Effort: Pulmonary effort is normal. No respiratory distress.     Breath sounds: No stridor. No wheezing or rhonchi.  Abdominal:     General: There is no distension.  Neurological:     Mental Status: He is unresponsive.     Cranial Nerves: Facial asymmetry present.     Comments: Withdraws to pain, has right ward gaze     Lab Results Lab Results  Component Value Date   WBC 6.5 04/12/2020   HGB 8.2 (L) 04/12/2020   HCT 24.5 (L) 04/12/2020   MCV 88.1 04/12/2020   PLT 173 04/12/2020    Lab Results  Component Value Date   CREATININE 11.51 (H) 04/13/2020   BUN 94 (H) 04/13/2020   NA 148 (H) 04/13/2020   K 4.5 04/13/2020   CL 119 (H) 04/13/2020    CO2 12 (L) 04/13/2020    Lab Results  Component Value Date   ALT 10 04/10/2020   AST 13 (L) 04/10/2020   ALKPHOS 95 04/10/2020   BILITOT 0.5 04/10/2020     Microbiology: Recent Results (from the past 240 hour(s))  SARS Coronavirus 2 by RT PCR (hospital order, performed in [facility] hospital lab) Nasopharyngeal Nasopharyngeal Swab     Status: None   Collection Time: 04/11/20  3:58 AM   Specimen: Nasopharyngeal Swab  Result Value Ref Range Status   SARS Coronavirus 2 NEGATIVE NEGATIVE Final    Comment: (NOTE) SARS-CoV-2 target nucleic acids are NOT DETECTED.  The SARS-CoV-2 RNA is  generally detectable in upper and lower respiratory specimens during the acute phase of infection. The lowest concentration of SARS-CoV-2 viral copies this assay can detect is 250 copies / mL. A negative result does not preclude SARS-CoV-2 infection and should not be used as the sole basis for treatment or other patient management decisions.  A negative result may occur with improper specimen collection / handling, submission of specimen other than nasopharyngeal swab, presence of viral mutation(s) within the areas targeted by this assay, and inadequate number of viral copies (<250 copies / mL). A negative result must be combined with clinical observations, patient history, and epidemiological information.  Fact Sheet for Patients:   StrictlyIdeas.no  Fact Sheet for Healthcare Providers: BankingDealers.co.za  This test is not yet approved or  cleared by the [place] FDA and has been authorized for detection and/or diagnosis of SARS-CoV-2 by FDA under an Emergency Use Authorization (EUA).  This EUA will remain in effect (meaning this test can be used) for the duration of the COVID-19 declaration under Section 564(b)(1) of the Act, 21 U.S.C. section 360bbb-3(b)(1), unless the authorization is terminated or revoked sooner.  Performed at [facility], [place] [address] [place], [place] [address]     Alcide Evener, [facility] [phone] pager  04/13/2020, 2:38 PM

## 2020-04-13 NOTE — Sedation Documentation (Signed)
Pt restless  

## 2020-04-13 NOTE — Progress Notes (Addendum)
NEUROLOGY PROGRESS NOTE   Subjective: Patient currently sedated, does not open eyes to voice or clap.  No active seizure on EEG.  Exam: Vitals:   04/13/20 1200 04/13/20 1237  BP:    Pulse: (!) 111   Resp: (!) 25   Temp:  98 F (36.7 C)  SpO2: 100%     ROS Unable to obtain secondary to sedation    Physical Exam  Constitutional: Appears well-developed and well-nourished.  Eyes: No scleral injection HENT: No OP obstrucion Head: Normocephalic.  Cardiovascular: Normal rate and regular rhythm.  Respiratory: Deep with snoring GI: Soft.  No distension. There is no tenderness.  Skin: WDI   Neuro:  Mental Status: Patient does not respond to verbal stimuli.  Does not respond to deep sternal rub.  Does not follow commands.  No verbalizations noted.  Cranial Nerves: II: patient does not respond confrontation bilaterally,  III,IV,VI: doll's response present bilaterally. pupils right 2 mm, left 2 mm,and reactive bilaterally.  Slight right gaze but able to cross midline V,VII: corneal reflex present bilaterally  VIII: patient does not respond to verbal stimuli IX,X: gag reflex present, XI: trapezius strength unable to test bilaterally XII: tongue strength unable to test Motor: Spontaneous moves upper and lower extremities antigravity Sensory: Withdraws bilateral legs to noxious plantar stimuli Plantars: Upgoing bilaterally Cerebellar: Unable to perform  Medications:  Scheduled:  Chlorhexidine Gluconate Cloth  6 each Topical Q0600   [START ON 04/14/2020] clopidogrel  75 mg Oral Daily   darbepoetin (ARANESP) injection - DIALYSIS  40 mcg Intravenous Q Fri-HD   [START ON 04/14/2020] heparin  5,000 Units Subcutaneous Q8H   insulin aspart  0-9 Units Subcutaneous Q4H   LORazepam  2 mg Intravenous Once   pantoprazole  40 mg Oral Daily   pravastatin  80 mg Oral QHS   tamsulosin  0.4 mg Oral Daily   Continuous:  dextrose     lacosamide (VIMPAT) IV 50 mg (04/13/20 1035)    levETIRAcetam 500 mg (04/13/20 1134)   GYJ:[id number], labetalol  Pertinent Labs/Diagnostics: -Sodium 148 -Chloride 119 -CO2 12 -Blood glucose 68 -BUN 94 -Creatinine 11.51 -Phosphorus 7.6   [name]  Result Date: 04/12/2020  IMPRESSION: Mildly motion degraded examination. 2 mm acute or early subacute infarct within the left parietal lobe periatrial white matter. Stable moderate to advanced chronic small vessel ischemic disease. Redemonstrated chronic small-vessel infarcts within the left corona radiata/basal ganglia, right basal ganglia, thalami and left cerebellum. Stable, mild generalized parenchymal atrophy. 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 Pansinusitis. Trace left mastoid effusion. Nonspecific 7 mm cystic appearing lesion along the left external ear. Direct visualization recommended. Electronically Signed   By: Kellie Simmering DO   On: 04/12/2020 12:10     Overnight EEG with video  Result Date: 04/13/2020   ABNORMALITY -Continuous rhythmic slow, generalized   IMPRESSION: This study is suggestive of moderate diffuse encephalopathy, nonspecific etiology. No seizures or epileptiform discharges were seen throughout the recording.   Priyanka Barbra Sarks   EEG LTVM - Continuous Bedside W/ Video Includes Portable EEG Read  Result Date: 04/12/2020  IMPRESSION: This study is suggestive of moderate diffuse encephalopathy, nonspecific etiology. No seizures or epileptiform discharges were seen throughout the recording. Lora Havens   CT HEAD CODE STROKE WO CONTRAST  Addendum Date: 04/12/2020   ADDENDUM REPORT: 04/12/2020 10:49 ADDENDUM: These results were called by telephone at the time of interpretation on 04/12/2020 at 10:49 am to provider Sal,  who verbally acknowledged these results. Electronically Signed   By: Franchot Gallo M.D.   On: 04/12/2020 10:49   Result Date:  04/12/2020 IMPRESSION: 1. Extensive white matter changes consistent with chronic microvascular ischemia. This is stable from prior studies. No acute abnormality. 2. ASPECTS is 10 Electronically Signed: By: Franchot Gallo M.D. On: 04/12/2020 10:40     Etta Quill PA-C [facility] [phone]  Assessment: 64 year old male with seizure history noncompliance with Keppra along with end-stage renal disease who declined to go on dialysis.  In addition previous stroke with right side residual weakness who was admitted with breakthrough seizures in the setting of noncompliance with Keppra.  Code stroke initially called but was more suggestive of seizures.  Stat MRI was obtained which demonstrated a tiny 2 mm punctate infarct in the left parietal lobe.  That stroke does not explain his symptoms.  Patient was treated along the status pathway with 2 mg Ativan, Keppra and Vimpat load.  Avoided other sedating antiepileptic medications given his poor mentation after Ativan.  Patient's gaze deviation did improve and he was noted to be snoring.  Patient was hooked up to continuous EEG which was notable for moderate encephalopathy and no seizures.  This continues to be the reading today.  Most likely patient's encephalopathy is due to postictal along with toxic metabolic abnormalities.   Recommendations: -Continue Keppra 500 mg twice daily and continue Vimpat 50 mg twice daily -Seizure precautions -We will continue LTM but most likely tomorrow DC. -Please push the button on LTM machine if he has a seizure -Give Ativan 2 mg for seizure lasting over 2 minutes    04/13/2020, 1:48 PM

## 2020-04-13 NOTE — Progress Notes (Signed)
Patient on continuous EEG per neurology - request was made to IR by nephrology yesterday for tunneled HD catheter placement which was planned for today with moderate sedation using Versed. Unable to perform procedure with EEG machinery hooked up (leads may remain).  Discussed above with Waunita Schooner, neurology PA-C today who states ok to disconnect machinery and leave leads in place for IR procedure with plans to reconnect machinery once patient returns to floor. He also gives ok to use Versed during procedure for moderate sedation.   Will proceed with tunneled HD catheter placement today as planned. Please call IR with questions or concerns.  Candiss Norse, PA-C

## 2020-04-13 NOTE — Progress Notes (Signed)
LTM maint complete - no skin breakdown under: Fp2 F4 A2

## 2020-04-13 NOTE — Procedures (Signed)
Cortrak  Tube Type:  Cortrak - 43 inches Tube Location:  Left nare Initial Placement:  Stomach Secured by: Bridle Technique Used to Measure Tube Placement:  Documented cm marking at nare/ corner of mouth Cortrak Secured At:  65 cm    Cortrak Tube Team Note:  Consult received to place a Cortrak feeding tube.   X-ray is required, abdominal x-ray has been ordered by the Cortrak team. Please confirm tube placement before using the Cortrak tube.   If the tube becomes dislodged please keep the tube and contact the Cortrak team at [email or web address] (password [email or web address]) for replacement.  If after hours and replacement cannot be delayed, place a NG tube and confirm placement with an abdominal x-ray.    Koleen Distance MS, RD, LDN Please refer to [facility] for RD and/or RD on-call/weekend/after hours pager

## 2020-04-13 NOTE — Progress Notes (Signed)
Contacted on call MD, B. Chotiner advised manual bp 180/90, also advised pt HR and RR remain tachy. MD gave verbal order for 10mg  of Hydralazine.

## 2020-04-13 NOTE — Progress Notes (Signed)
Initial Nutrition Assessment  DOCUMENTATION CODES:   Not applicable  INTERVENTION:   Please obtain updated weight.   Via Cortrak: -Initiate Glucerna 1.5 cal @ 75ml/hr, advance 67ml/hr Q4H until goal rate of 2ml/hr (1564ml) is reached  At goal, tube feeding regimen will provide 2340 kcals, 128 grams of protein, 1140ml free water  NUTRITION DIAGNOSIS:   Inadequate oral intake related to inability to eat as evidenced by NPO status.    GOAL:   Patient will meet greater than or equal to 90% of their needs    MONITOR:   TF tolerance, Weight trends, Labs, I & O's  REASON FOR ASSESSMENT:   Consult Enteral/tube feeding initiation and management  ASSESSMENT:   Pt admitted with acute metabolic encephalopathy, likely mulitfactorial including seizure, HTN, acute stroke, uremia, and possible CNS infection. PMH includes DM, OA, HTN, CKD V, neuropathies, previous CVA, hx seizures, and poor compliance (has previously refused dialysis and is noncompliant with medications).  Per MD, pt not awake/interactive today and has not demonstrated any awareness of boluses with SLP, so pt is NPO. Cortrak was placed today (gastric).   Nephrology consulted. Pt now has ESRD with acidosis and some uremia. Pt/family now agreeable to beginning dialysis. Pt to have tunneled HD catheter placement today.   Pt's HIV screening was positive (new dx); LP to rule out crypto meningitis.   Pt unavailable at time of RD visit. Discussed pt with MD and RN. Per MD, tube feeding to start after pt returns from catheter placement.  MD requesting Glucerna tube feeding.    No wt from this admission, most recent wt from 02/13/20 is 68.4 kg. Pt weighed 82.6 kg on 06/22/19. This indicates a 17% wt loss x8 months, which is significant for time frame. Current wt needs to be obtained. Suspect pt is malnourished; however, unable to diagnose without updated wt, diet history, and nutrition-focused physical exam.   Labs: Sodium 148  (H), Phosphorus 7.6 (H) Medications: aranesp, novolog, protonix   NUTRITION - FOCUSED PHYSICAL EXAM:  Unable to perform at this time, will attempt at follow-up.  Diet Order:   Diet Order            Diet NPO time specified  Diet effective midnight                 EDUCATION NEEDS:   No education needs have been identified at this time  Skin:  Skin Assessment: Reviewed RN Assessment  Last BM:  unknown  Height:   Ht Readings from Last 1 Encounters:  09/14/19 6' (1.829 m)    Weight:   Wt Readings from Last 10 Encounters:  02/13/20 68.4 kg  09/14/19 74.7 kg  06/22/19 82.6 kg  11/01/18 89.2 kg  10/02/18 89.8 kg  10/01/18 89.8 kg  09/20/18 87.1 kg  07/15/18 93.9 kg  07/01/18 91 kg  06/11/18 91.3 kg    BMI:  There is no height or weight on file to calculate BMI.  Estimated Nutritional Needs:   Kcal:  9892-1194  Protein:  115-130 grams  Fluid:  1043ml + UOP    Larkin Ina, MS, RD, LDN RD pager number and weekend/on-call pager number located in [place].

## 2020-04-13 NOTE — Progress Notes (Signed)
Glucernia 1.5ml started @25ml /hr. Will advance feeding 92ml/hr Q4H. The goal is 28mlhr.

## 2020-04-13 NOTE — Sedation Documentation (Signed)
Total time: 10 mins 1mg  versed 2g ancef Pt tolerated procedure very well, report called to floor rn

## 2020-04-13 NOTE — Progress Notes (Signed)
MD Sal has been updated pt status. Patient was alert to self and grunted with orientation questions. Patient did look midline and went back to sleep. Pt returned from IR and has been connected.

## 2020-04-13 NOTE — Sedation Documentation (Signed)
Pt very solemnent on arrival to IR, consent signed by pt daughter

## 2020-04-13 NOTE — Consult Note (Addendum)
NAME:  Cassidy Tabet, MRN:  [id number], DOB:  [birth year], LOS: 3 ADMISSION DATE:  04/10/2020, CONSULTATION DATE:   REFERRING MD: Florencia Reasons, MD CHIEF COMPLAINT: Encephalopathy   Brief History   PCCM consulted to evaluate patient's ability to protect airway due to acute encephelopathy  History of present illness   Patient presented by EMS s/p witnessed 4 minutes seizure in setting of not taking his seizure medication. He was loaded with Keppra and started on his home dose of Keppra. He was also noted to hypertensive to 230/118. He came in with CKD Stage 5 which has progressed to ESRD.   ON 7/22 a code stroke was called and patient was found to have a millimeter acute or early subacute infarct within the left parietal lobe.  He was also screened for HIV on admission and found to be HIV positive.  CD4 count is 39 and viral load 624,000 copies/ml.  Past Medical History   has a past medical history of Arthritis, Diabetes mellitus, type II, insulin dependent ([place]), GERD (gastroesophageal reflux disease), Hypertension, Hypertensive emergency (05/21/2012), Immune deficiency disorder ([place]), Migraines, Neuropathy in diabetes ([place]), Ruptured lumbar disc, and Stroke ([place]) (2013).  Significant Hospital Events   7/21: admission , post ictal with witnessed seizure 7/22:   Consults:  Neurolgy Nephrology IR  PCCM Procedures:    Significant Diagnostic Tests:  7/21: US Renal  7/22: CT Head 7/22 MR Brain  Micro Data:  SARS Coronavirus : Negative   Antimicrobials:  n/a  Interim history/subjective:  n/a  Objective   Blood pressure (!) 177/94, pulse 104, temperature 99.1 F (37.3 C), temperature source Oral, resp. rate 19, SpO2 99 %.        Intake/Output Summary (Last 24 hours) at 04/13/2020 1741 Last data filed at 04/13/2020 0400 Gross per 24 hour  Intake 289.52 ml  Output 275 ml  Net 14.52 ml   There were no vitals filed for this visit.  Examination: General: Chronically ill  appearing, on room air HENT: EEG electrodes in place, Right gaze preference, Trachea midline Lungs: symmetrical lung expansion, not using accessory muscles, no wheezes or rhonchi Cardiovascular: RRR, no murmurs rubs or gallop Abdomen: Soft , non distended  Extremities: No LE edema, rash - pictures are already in chart  Neuro: Right gaze, reactive pupils, right ue contracted, does not withdraw to painful stimuli  - witnessed response on core track placement, cough and purposeful movement GU: External catheter  Noted labs K- 4.5 BUN 94 Cr 11.5 , GFR ~5 CD4 39, Viral load 624K copies/ml Hgb 8.2 - appears stable   Resolved Hospital Problem list     Assessment & Plan:   Assessment:  Seizure disorder  Metabolic encephalopathy CVA ESRD Hypertension Anemia  Patient presented to the hospital postictal after witnessed seizure.  On day 2 of his admission had a right gaze deviation and change in mental status, found to have a left parietal stroke on MRI Brain.  Noted to have chronic small vessel disease on MRI Brain.  Patient has refused preparation for HD in the past per chart review and so now needs access. Nephrology has been consulted and patient is scheduled to have a tunneled dialysis catheter placed by IR.   During core track placement patient was actively reaching for the RD , spoke , and was coughing. Given this level of witnessed alertness I believe patient is appropriate on the floor. He will not follow my commands, but shows he can protect his airway. With core track  placement BP meds can be given. Given patients comorbitys and history of non-compliance on chart review I believe it is important to have a family discussion. I attempted several times to call patients daughter to better understand patient and family wishes, but was unable to reach her.  Plan: - Palliative Care Consult for help with goals of care discussion. Patient will need compliance on seizure medications, HIV  medications , and HD if/when he makes a meaningful recovery.  - HTN medication per primary, now has cortrak  - ESRD ,  HD per nephrology. Scheduled with IR to place tunneled HD cath - Neurology following, Keppra 500 mg BID and Vimpat 50 mg BID, no current seizure activity - HIV , ID recommending lumbar puncture for further workup for opportunistic infection - Please see Dr.Agarwala's addendum for final recommendation.     Best practice:  Diet: NPO DVT prophylaxis: Subq heparin  Glucose control: SSI Code Status: Full Family Communication: attempted to call daughter  Disposition: Progressive care   Labs   CBC: Recent Labs  Lab 04/10/20 2037 04/11/20 0018 04/11/20 0402 04/12/20 0434  WBC 6.6 6.8 9.1 6.5  NEUTROABS 4.7  --   --  5.0  HGB 10.3* 11.4* 9.7* 8.2*  HCT 31.5* 34.5* 29.4* 24.5*  MCV 90.0 87.6 89.4 88.1  PLT 192 201 195 782    Basic Metabolic Panel: Recent Labs  Lab 04/10/20 2037 04/11/20 0018 04/11/20 0402 04/12/20 0434 04/13/20 1113  NA 143  --  145 143 148*  K 4.2  --  4.4 4.4 4.5  CL 115*  --  117* 116* 119*  CO2 13*  --  11* 13* 12*  GLUCOSE 106*  --  94 88 68*  BUN 76*  --  78* 86* 94*  CREATININE 9.98* 9.99* 9.99* 10.66* 11.51*  CALCIUM 7.5*  --  7.6* 7.5* 8.1*  PHOS  --   --   --   --  7.6*   GFR: CrCl cannot be calculated (Unknown ideal weight.). Recent Labs  Lab 04/10/20 2037 04/11/20 0018 04/11/20 0402 04/12/20 0434  WBC 6.6 6.8 9.1 6.5    Liver Function Tests: Recent Labs  Lab 04/10/20 2037 04/13/20 1113  AST 13*  --   ALT 10  --   ALKPHOS 95  --   BILITOT 0.5  --   PROT 6.6  --   ALBUMIN 2.8* 2.3*   No results for input(s): LIPASE, AMYLASE in the last 168 hours. No results for input(s): AMMONIA in the last 168 hours.  ABG    Component Value Date/Time   PHART 7.346 (L) 04/12/2020 1856   PCO2ART 23.7 (L) 04/12/2020 1856   PO2ART 105 04/12/2020 1856   HCO3 12.7 (L) 04/12/2020 1856   TCO2 14 (L) 02/13/2020 1737    ACIDBASEDEF 12.0 (H) 04/12/2020 1856   O2SAT 97.3 04/12/2020 1856     Coagulation Profile: Recent Labs  Lab 04/13/20 1113  INR 1.1    Cardiac Enzymes: No results for input(s): CKTOTAL, CKMB, CKMBINDEX, TROPONINI in the last 168 hours.  HbA1C: Hemoglobin A1C  Date/Time Value Ref Range Status  07/15/2018 03:03 PM 8.0 (A) 4.0 - 5.6 % Final  07/01/2018 03:10 PM 8.5 (A) 4.0 - 5.6 % Final   Hgb A1c MFr Bld  Date/Time Value Ref Range Status  02/14/2020 09:34 AM 5.2 4.8 - 5.6 % Final    Comment:    (NOTE) Pre diabetes:          5.7%-6.4% Diabetes:              >  6.4% Glycemic control for   <7.0% adults with diabetes   05/05/2018 02:32 PM 10.5 (H) 4.6 - 6.5 % Final    Comment:    Glycemic Control Guidelines for People with Diabetes:Non Diabetic:  <6%Goal of Therapy: <7%Additional Action Suggested:  >8%     CBG: Recent Labs  Lab 04/10/20 2031 04/11/20 1620 04/12/20 1006 04/13/20 1655  GLUCAP 91 115* 83 94    Review of Systems:   Unable to perform due to patient mental status   Past Medical History  He,  has a past medical history of Arthritis, Diabetes mellitus, type II, insulin dependent ([place]), GERD (gastroesophageal reflux disease), Hypertension, Hypertensive emergency (05/21/2012), Immune deficiency disorder ([place]), Migraines, Neuropathy in diabetes ([place]), Ruptured lumbar disc, and Stroke ([place]) (2013).   Surgical History    Past Surgical History:  Procedure Laterality Date  . KNEE ARTHROSCOPY Bilateral      Social History   reports that he has been smoking cigarettes. He has been smoking about 0.50 packs per day. He has never used smokeless tobacco. He reports current drug use. Drug: Marijuana. He reports that he does not drink alcohol.   Family History   His family history includes Breast cancer in his sister; Diabetes in his sister; Epilepsy in his father; Heart attack in his brother; Hypertension in his father; Other in his mother; Stroke in his brother and  father.   Allergies No Known Allergies   Home Medications  Prior to Admission medications   Medication Sig Start Date End Date Taking? Authorizing Provider  amLODipine (NORVASC) 10 MG tablet TAKE 1 TABLET BY MOUTH DAILY Patient taking differently: Take 10 mg by mouth daily.  03/28/20  Yes Koberlein, Junell C, MD  calcitRIOL (ROCALTROL) 0.25 MCG capsule Take 1 capsule (0.25 mcg total) by mouth daily. 03/07/20  Yes Koberlein, [name], MD  clopidogrel (PLAVIX) 75 MG tablet Take 1 tablet (75 mg total) by mouth daily. 03/07/20  Yes Koberlein, Junell C, MD  furosemide (LASIX) 40 MG tablet Take 1 tablet (40 mg total) by mouth daily. 03/07/20  Yes Koberlein, [name], MD  hydrALAZINE (APRESOLINE) 25 MG tablet Take 1 tablet (25 mg total) by mouth every 8 (eight) hours. 03/07/20  Yes Koberlein, Junell C, MD  hydrocortisone cream 1 % Apply 1 application topically 4 (four) times daily as needed for itching.   Yes [provider]  pantoprazole (PROTONIX) 40 MG tablet TAKE 1 TABLET BY MOUTH EVERY DAY Patient taking differently: Take 40 mg by mouth daily.  01/23/20  Yes Koberlein, Junell C, MD  tamsulosin (FLOMAX) 0.4 MG CAPS capsule TAKE 1 CAPSULE BY MOUTH EVERY DAY Patient taking differently: Take 0.4 mg by mouth daily.  03/28/20  Yes Koberlein, [name], MD  ACCU-CHEK AVIVA PLUS test strip 1 each by Other route See admin instructions. Use 1 test strip to test blood sugar two-three times daily 04/20/18   [provider]  ACCU-CHEK AVIVA PLUS test strip USE 1 strip TO test blood sugar 2 TIMES DAILY TO 3 TIMES DAILY Patient taking differently: 1 each by Other route in the morning, at noon, and at bedtime.  06/10/19   [name], MD  blood glucose meter kit and supplies KIT Dispense based on patient and insurance preference. Check blood sugar 4 times a day Patient taking differently: Inject 1 each into the skin See admin instructions. Dispense based on patient and insurance preference. Check  blood sugar 4 times a day 06/11/18   [name], MD  chlorthalidone (HYGROTON) 25 MG tablet Take 1 tablet (25 mg total) by mouth daily. 03/07/20   [name], MD  Cholecalciferol (VITAMIN D) 50 MCG (2000 UT) tablet Take 1 tablet (2,000 Units total) by mouth in the morning. 03/07/20   [name], MD  cloNIDine (CATAPRES) 0.2 MG tablet Take 1 tablet (0.2 mg total) by mouth 2 (two) times daily. 03/07/20   [name], MD  famotidine (PEPCID) 20 MG tablet Take 1 tablet (20 mg total) by mouth 2 (two) times daily. 03/07/20   [name], MD  hydrOXYzine (ATARAX/VISTARIL) 25 MG tablet Take 1 tablet (25 mg total) by mouth every 8 (eight) hours as needed for itching. 03/27/20   Tegeler, Gwenyth Allegra, MD  insulin aspart protamine - aspart (NOVOLOG MIX 70/30 FLEXPEN) (70-30) 100 UNIT/ML FlexPen INJECT 28 UNITS INTO THE SKIN EVERY MORNING Patient taking differently: Inject 28 Units into the skin in the morning.  12/01/19   Koberlein, [name], MD  Insulin Pen Needle (SURE COMFORT PEN NEEDLES) 31G X 8 MM MISC Use as directed twice daily with Novolog flex pen Patient taking differently: 1 each by Other route See admin instructions. Use as directed twice daily with Novolog flex pen 02/10/19   Koberlein, [name], MD  levETIRAcetam (KEPPRA) 500 MG tablet Take 1 tablet (500 mg total) by mouth 2 (two) times daily. 03/07/20 04/06/20  [name], MD  Misc. Devices (COMMODE 3-IN-1) MISC Use as directed 02/24/20   [name], MD  Misc. Devices (TRANSFER BENCH) MISC Use as directed 02/24/20   Koberlein, [name], MD  NARCAN 4 MG/0.1ML LIQD nasal spray kit Place 0.4 mg into the nose once.  05/31/18   [provider]  Oxycodone HCl 20 MG TABS Take 20 mg by mouth every 6 (six) hours as needed (pain).  05/31/18   [provider]  potassium chloride SA (KLOR-CON) 20 MEQ tablet Take 20 mEq by mouth daily.  12/07/19   [provider]  pravastatin (PRAVACHOL) 80  MG tablet Take 1 tablet (80 mg total) by mouth at bedtime. 03/07/20   [name], MD  sildenafil (REVATIO) 20 MG tablet Take 20-100 mg by mouth as needed for erectile dysfunction. 03/28/20   [provider]  TRUEPLUS INSULIN SYRINGE 31G X 5/16" 1 ML MISC Use pen needle with insulin 2 times daily Patient taking differently: 1 each by Other route in the morning and at bedtime. With insulin 03/31/19   [name], MD     Critical care time:     Tamsen Snider, MD PGY2

## 2020-04-13 NOTE — Procedures (Addendum)
.  Patient Name: Jemal Miskell  MRN: [id number]  Epilepsy Attending: Lora Havens  Referring Physician/Provider: Dr Donnetta Simpers Duration: 04/12/2020 1255 to 04/13/2020 1255  Patient history: 64yo M with forced right gaze deviation concerning for seizure. EEG to evaluate for seizure.  Level of alertness: Awake, asleep  AEDs during EEG study: LEV, vimpat  Technical aspects: This EEG study was done with scalp electrodes positioned according to the 10-20 International system of electrode placement. Electrical activity was acquired at a sampling rate of 500Hz  and reviewed with a high frequency filter of 70Hz  and a low frequency filter of 1Hz . EEG data were recorded continuously and digitally stored.   Description: No posterior dominant rhythm was seen. Sleep was characterized by vertex waves, sleep spindles (12 to 14 Hz), maximal frontocentral region.  EEG showed continuous generalized rhythmic 3 to 6 Hz theta-delta slowing. Hyperventilation and photic stimulation were not performed.     ABNORMALITY -Continuous rhythmic slow, generalized  IMPRESSION: This study is suggestive of moderate diffuse encephalopathy, nonspecific etiology. No seizures or epileptiform discharges were seen throughout the recording.  Liah Morr Barbra Sarks

## 2020-04-14 ENCOUNTER — Inpatient Hospital Stay (HOSPITAL_COMMUNITY): Payer: Medicare HMO

## 2020-04-14 DIAGNOSIS — G9340 Encephalopathy, unspecified: Secondary | ICD-10-CM | POA: Diagnosis not present

## 2020-04-14 DIAGNOSIS — N186 End stage renal disease: Secondary | ICD-10-CM | POA: Diagnosis not present

## 2020-04-14 DIAGNOSIS — E119 Type 2 diabetes mellitus without complications: Secondary | ICD-10-CM | POA: Diagnosis not present

## 2020-04-14 DIAGNOSIS — N185 Chronic kidney disease, stage 5: Secondary | ICD-10-CM | POA: Diagnosis not present

## 2020-04-14 DIAGNOSIS — I6930 Unspecified sequelae of cerebral infarction: Secondary | ICD-10-CM | POA: Diagnosis not present

## 2020-04-14 DIAGNOSIS — R569 Unspecified convulsions: Secondary | ICD-10-CM | POA: Diagnosis not present

## 2020-04-14 DIAGNOSIS — B20 Human immunodeficiency virus [HIV] disease: Secondary | ICD-10-CM | POA: Diagnosis not present

## 2020-04-14 LAB — HEPATITIS A ANTIBODY, TOTAL: hep A Total Ab: NONREACTIVE

## 2020-04-14 LAB — PHOSPHORUS
Phosphorus: 4.4 mg/dL (ref 2.5–4.6)
Phosphorus: 2.5 mg/dL (ref 2.5–4.6)

## 2020-04-14 LAB — CBC WITH DIFFERENTIAL/PLATELET
Abs Immature Granulocytes: 0.05 10*3/uL (ref 0.00–0.07)
Basophils Absolute: 0 10*3/uL (ref 0.0–0.1)
Basophils Relative: 0 %
Eosinophils Absolute: 0.2 10*3/uL (ref 0.0–0.5)
Eosinophils Relative: 4 %
HCT: 27.5 % — ABNORMAL LOW (ref 39.0–52.0)
Hemoglobin: 9.2 g/dL — ABNORMAL LOW (ref 13.0–17.0)
Immature Granulocytes: 1 %
Lymphocytes Relative: 10 %
Lymphs Abs: 0.5 10*3/uL — ABNORMAL LOW (ref 0.7–4.0)
MCH: 29.1 pg (ref 26.0–34.0)
MCHC: 33.5 g/dL (ref 30.0–36.0)
MCV: 87 fL (ref 80.0–100.0)
Monocytes Absolute: 0.6 10*3/uL (ref 0.1–1.0)
Monocytes Relative: 11 %
Neutro Abs: 3.6 10*3/uL (ref 1.7–7.7)
Neutrophils Relative %: 74 %
Platelets: 180 10*3/uL (ref 150–400)
RBC: 3.16 MIL/uL — ABNORMAL LOW (ref 4.22–5.81)
RDW: 14.2 % (ref 11.5–15.5)
WBC: 4.9 10*3/uL (ref 4.0–10.5)
nRBC: 0 % (ref 0.0–0.2)

## 2020-04-14 LAB — COMPREHENSIVE METABOLIC PANEL
ALT: 8 U/L (ref 0–44)
AST: 19 U/L (ref 15–41)
Albumin: 2.3 g/dL — ABNORMAL LOW (ref 3.5–5.0)
Alkaline Phosphatase: 79 U/L (ref 38–126)
Anion gap: 12 (ref 5–15)
BUN: 55 mg/dL — ABNORMAL HIGH (ref 8–23)
CO2: 21 mmol/L — ABNORMAL LOW (ref 22–32)
Calcium: 8 mg/dL — ABNORMAL LOW (ref 8.9–10.3)
Chloride: 110 mmol/L (ref 98–111)
Creatinine, Ser: 7.74 mg/dL — ABNORMAL HIGH (ref 0.61–1.24)
GFR calc Af Amer: 8 mL/min — ABNORMAL LOW (ref >60)
GFR calc non Af Amer: 7 mL/min — ABNORMAL LOW (ref >60)
Glucose, Bld: 107 mg/dL — ABNORMAL HIGH (ref 70–99)
Potassium: 3.8 mmol/L (ref 3.5–5.1)
Sodium: 143 mmol/L (ref 135–145)
Total Bilirubin: 0.4 mg/dL (ref 0.3–1.2)
Total Protein: 6.1 g/dL — ABNORMAL LOW (ref 6.5–8.1)

## 2020-04-14 LAB — CRYPTOCOCCAL ANTIGEN, CSF: Crypto Ag: NEGATIVE

## 2020-04-14 LAB — CSF CELL COUNT WITH DIFFERENTIAL
RBC Count, CSF: 3 /mm3 — ABNORMAL HIGH
Tube #: 3
WBC, CSF: 1 /mm3 (ref 0–5)

## 2020-04-14 LAB — PROTEIN AND GLUCOSE, CSF
Glucose, CSF: 61 mg/dL (ref 40–70)
Total  Protein, CSF: 64 mg/dL — ABNORMAL HIGH (ref 15–45)

## 2020-04-14 LAB — GLUCOSE, CAPILLARY
Glucose-Capillary: 101 mg/dL — ABNORMAL HIGH (ref 70–99)
Glucose-Capillary: 113 mg/dL — ABNORMAL HIGH (ref 70–99)
Glucose-Capillary: 113 mg/dL — ABNORMAL HIGH (ref 70–99)
Glucose-Capillary: 77 mg/dL (ref 70–99)
Glucose-Capillary: 83 mg/dL (ref 70–99)
Glucose-Capillary: 95 mg/dL (ref 70–99)
Glucose-Capillary: 98 mg/dL (ref 70–99)

## 2020-04-14 LAB — MAGNESIUM
Magnesium: 1.5 mg/dL — ABNORMAL LOW (ref 1.7–2.4)
Magnesium: 1.6 mg/dL — ABNORMAL LOW (ref 1.7–2.4)

## 2020-04-14 LAB — LEVETIRACETAM LEVEL: Levetiracetam Lvl: <1 ug/mL — ABNORMAL LOW (ref 10.0–40.0)

## 2020-04-14 LAB — PARATHYROID HORMONE, INTACT (NO CA): PTH: 160 pg/mL — ABNORMAL HIGH (ref 15–65)

## 2020-04-14 LAB — HIV ANTIBODY (ROUTINE TESTING W REFLEX): HIV Screen 4th Generation wRfx: REACTIVE — AB

## 2020-04-14 LAB — RPR: RPR Ser Ql: NONREACTIVE

## 2020-04-14 MED ORDER — LABETALOL HCL 5 MG/ML IV SOLN
5.0000 mg | INTRAVENOUS | Status: DC | PRN
  Administered 2020-04-14 – 2020-04-17 (×3): 5 mg via INTRAVENOUS
  Filled 2020-04-14 (×3): qty 4

## 2020-04-14 MED ORDER — MAGNESIUM SULFATE 2 GM/50ML IV SOLN: 2.0000 g | Freq: Once | INTRAVENOUS | Status: DC

## 2020-04-14 MED ORDER — PROSOURCE TF PO LIQD
45.0000 mL | Freq: Every day | ORAL | Status: DC
  Administered 2020-04-14 – 2020-04-17 (×4): 45 mL
  Filled 2020-04-14 (×5): qty 45

## 2020-04-14 MED ORDER — PRAVASTATIN SODIUM 40 MG PO TABS
80.0000 mg | ORAL_TABLET | Freq: Every day | ORAL | Status: DC
  Administered 2020-04-14 – 2020-04-17 (×4): 80 mg
  Filled 2020-04-14 (×4): qty 2

## 2020-04-14 MED ORDER — MAGNESIUM SULFATE 2 GM/50ML IV SOLN
2.0000 g | Freq: Once | INTRAVENOUS | Status: AC
  Administered 2020-04-14: 2 g via INTRAVENOUS
  Filled 2020-04-14 (×2): qty 50

## 2020-04-14 MED ORDER — PANTOPRAZOLE SODIUM 40 MG PO PACK
40.0000 mg | PACK | Freq: Every day | ORAL | Status: DC
  Administered 2020-04-15 – 2020-04-17 (×3): 40 mg
  Filled 2020-04-14 (×4): qty 20

## 2020-04-14 MED ORDER — HEPARIN SODIUM (PORCINE) 1000 UNIT/ML IJ SOLN
INTRAMUSCULAR | Status: AC
  Administered 2020-04-14: 1000 [IU]
  Filled 2020-04-14: qty 4

## 2020-04-14 MED ORDER — HEPARIN SODIUM (PORCINE) 1000 UNIT/ML IJ SOLN
INTRAMUSCULAR | Status: AC
  Filled 2020-04-14: qty 4

## 2020-04-14 MED ORDER — CLOPIDOGREL BISULFATE 75 MG PO TABS: 75.0000 mg | ORAL_TABLET | Freq: Every day | ORAL | Status: DC

## 2020-04-14 MED ORDER — NEPRO/CARBSTEADY PO LIQD
1000.0000 mL | ORAL | Status: AC
  Administered 2020-04-14: 1000 mL
  Filled 2020-04-14 (×3): qty 1000

## 2020-04-14 NOTE — Progress Notes (Signed)
LTM EEG discontinued - no skin breakdown at unhook.   

## 2020-04-14 NOTE — Progress Notes (Signed)
Nutrition Follow-up  DOCUMENTATION CODES:   Not applicable  INTERVENTION:  Via Cortrak: -Transition to Nepro 1.8 cal @ 4m/hr (13235m -4595mrosource TF daily -free water per MD  Tube feeding will provide 2416 kcals, 117 grams of protein, 106 grams of protein, 959m70mee water  NUTRITION DIAGNOSIS:   Inadequate oral intake related to inability to eat as evidenced by NPO status.  Ongoing.  GOAL:   Patient will meet greater than or equal to 90% of their needs  Met with TF.   MONITOR:   TF tolerance, Weight trends, Labs, I & O's  REASON FOR ASSESSMENT:   Consult Enteral/tube feeding initiation and management  ASSESSMENT:   Pt admitted with acute metabolic encephalopathy, likely mulitfactorial including seizure, HTN, acute stroke, uremia, and possible CNS infection. PMH includes DM, OA, HTN, CKD V, neuropathies, previous CVA, hx seizures, and poor compliance (has previously refused dialysis and is noncompliant with medications).  7/23 Cortrak placed (Gastric)   RD working remotely. Discussed pt with MD and would like to transition pt to Nepro as pt had elevated phosphorus levels. Pt did have dialysis yesterday with a net UF of 17mL103mCurrent TF via Cortrak: Glucerna 1.5 cal @ 65ml/65mLabs: Mg 1.5 (L), Na 148 (H), CBGs 101-113 Medications: Aranesp, Novolog, Protonix  Diet Order:   Diet Order            Diet NPO time specified  Diet effective midnight                 EDUCATION NEEDS:   No education needs have been identified at this time  Skin:  Skin Assessment: Reviewed RN Assessment  Last BM:  unknown  Height:   Ht Readings from Last 1 Encounters:  09/14/19 6' (1.829 m)    Weight:   Wt Readings from Last 1 Encounters:  04/14/20 69.1 kg    BMI:  Body mass index is 20.66 kg/m.  Estimated Nutritional Needs:   Kcal:  [id number]:  115-130 grams  Fluid:  1000ml +82m    Tarini Carrier Larkin InaD, LDN RD pager number and  weekend/on-call pager number located in [email or web address]

## 2020-04-14 NOTE — Progress Notes (Addendum)
PROGRESS NOTE    Richard [name]  OAC:[id number] DOB: Sep 20, 1956 DOA: 04/10/2020 PCP: [name], MD    Chief Complaint  Patient presents with  . Seizures    Brief Narrative:  Richard [name]  is a 64 y.o. male, with medical history significant ofdiabetes, osteoarthritis, hypertension,CKD 5,neuropathies, previous CVA with residual right-sided weakness, migraine headaches , patient with extremely poor compliance, he has refused hemodialysis access in the past, presents to ED via EMS status post seizures, he is known history of seizures, but has not been noncompliant with his seizure medications, seizures was witnessed by his roommate, seizures lasted for 4 minutes, she will patient's extremely encephalopathic, unable to provide any history, but upon my evaluation is more appropriate, patient report actually he never got his seizure medications, was not taking it, patient denies any fever, chest pain, shortness of breath, no dysuria or polyuria, reports usually ambulates with a walker, he has right-sided weakness at baseline. -in ED patient was postictal, he was loaded with Keppra, ED discussed with neurology who recommended to resume his home dose Keppra 500 mg p.o. twice daily, as well patient was noted to be hypertensive 230/118, patient received IV labetalol, IV hydralazine, as well patient with significant labs abnormalities which appears to be at baseline, including creatinine of 9.98, BUN of 76, baseline anemia with hemoglobin of 10.3, hospitalist were requested to admit  Subjective:  Had dialysis catheter placed by IR, started first dialysis yesterday cortrak placed for nutrition and medication He is alert and interactive today, he states he feels hungry ,he knows he is in the hospital in [facility], he is not oriented to time, he drifts off to sleep during conversation    Assessment & Plan:   Principal Problem:   Encephalopathy Active Problems:   Tobacco abuse    Protein-calorie malnutrition, mild (HCC)   Hyperlipidemia   Neuropathy in diabetes ([place])   GERD (gastroesophageal reflux disease)   Diabetes mellitus, type II, insulin dependent (HCC)   CKD (chronic kidney disease) stage 5, GFR less than 15 ml/min (HCC)   History of cerebrovascular accident (CVA) with residual deficit   Seizure ([place])   AIDS (acquired immune deficiency syndrome) ([place])   Acute metabolic encephalopathy, likely multifactorial including seizure, hypertension, acute stroke, uremia, and possible CNS infection. -Initial CT scan on admission no acute findings -Due to acute mental status change on July 22 a.m. ,code stroke initiated, stat CT scan no acute findings, stat MRI showed "2 mm acute or early subacute infarct within the left parietal lobe periatrial white matter" --on cEEG stopped on July 24 -HIV screening positive, neurology failed LP attempt at bedside, IR consulted for Lumbar puncture -He is more alert today  Addendum 2:20 PM :I talked to the radiologist who recommended hold Plavix and subcu heparin for now, will attempt LP either today or tomorrow  Recurrent seizures, present on admission -He is noncompliant with his seizure medication -He is also uremic, -Lumbar puncture pending to rule out underline CNS infection. -on IV Keppra and IV Vimpat  -Appreciate neurology recommendation  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 -Continue with Plavix and statin, currently npo, getting cortrak tube feeds -Plavix held for lumbar puncture   Hypertensive urgency, present on admission --On July 22, mri showed small acute stroke, allowed permissive hypertension on July 22 ,  -currently on as needed labetalol and hydralazine -Blood pressure seems to improved since started on dialysis yesterday, continue as needed IV medication for blood pressure  control  CKD stage V/uremia/anemia of chronic disease -Creatinine in 10-11  range, creatinine appears to be at baseline . 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  HLD -Meds per tube currently  Tobacco use -Counseled to quit using this.   history of severe noncompliance -He was counseled.    DVT prophylaxis: heparin injection 5,000 Units Start: 04/14/20 0600 SCDs Start: 04/10/20 2332   Code Status: Full Family Communication: Patient declined my offer to call his daughter Disposition:   Status is: Inpatient    Dispo: The patient is from: Home, he lives with a roommate              Anticipated d/c is to: To be determined              Anticipated d/c date is: To be determined              Patient currently renal failure needing dialysis, need lumbar puncture to rule out CNS infection  Consultants:  nephrology  Neurology Radiology Infectious disease  Procedures:   Bedside lumbar puncture attempted July 24 by neurology  Dialysis catheter placement by interventional radiology  Hemodialysis  Antimicrobials:   None     Objective: Vitals:   04/14/20 0148 04/14/20 0201 04/14/20 0348 04/14/20 0500  BP: (!) 134/76 (!) 155/74 (!) 185/105   Pulse: 89 87    Resp: 18 19    Temp:  98.6 F (37 C) 98 F (36.7 C)   TempSrc:  Oral Oral   SpO2:  100%    Weight:    69.1 kg    Intake/Output Summary (Last 24 hours) at 04/14/2020 0824 Last data filed at 04/14/2020 0201 Gross per 24 hour  Intake --  Output 417 ml  Net -417 ml   Filed Weights   04/14/20 0500  Weight: 69.1 kg     Examination:  General exam: He is more alert, interactive today, but drifts back to sleep during conversation Respiratory system: Clear to auscultation. Respiratory effort normal. Cardiovascular system: S1 & S2 heard, RRR. No JVD, no murmur, No pedal edema. Gastrointestinal system: Abdomen is nondistended, soft and nontender. No organomegaly or masses felt. Normal bowel sounds heard. Central nervous system: Chronic right arm contraction Extremities: Chronic right arm contraction Skin: No rashes, lesions or ulcers Psychiatry: Drowsy    Data Reviewed: I have personally reviewed following labs and imaging studies  CBC: Recent Labs  Lab 04/10/20 2037 04/11/20 0018 04/11/20 0402 04/12/20 0434  WBC 6.6 6.8 9.1 6.5  NEUTROABS 4.7  --   --  5.0  HGB 10.3* 11.4* 9.7* 8.2*  HCT 31.5* 34.5* 29.4* 24.5*  MCV 90.0 87.6 89.4 88.1  PLT 192 201 195 656    Basic Metabolic Panel: Recent Labs  Lab 04/10/20 2037 04/11/20 0018 04/11/20 0402 04/12/20 0434 04/13/20 1113 04/13/20 1829 04/14/20 0511  NA 143  --  145 143 148*  --   --   K 4.2  --  4.4 4.4 4.5  --   --   CL 115*  --  117*  116* 119*  --   --   CO2 13*  --  11* 13* 12*  --   --   GLUCOSE 106*  --  94 88 68*  --   --   BUN 76*  --  78* 86* 94*  --   --   CREATININE 9.98* 9.99* 9.99* 10.66* 11.51*  --   --   CALCIUM 7.5*  --  7.6* 7.5* 8.1*  --   --   MG  --   --   --   --   --  1.6* 1.5*  PHOS  --   --   --   --  7.6* 8.3* 4.4    GFR: CrCl cannot be calculated (Unknown ideal weight.).  Liver Function Tests: Recent Labs  Lab 04/10/20 2037 04/13/20 1113  AST 13*  --   ALT 10  --   ALKPHOS 95  --   BILITOT 0.5  --   PROT 6.6  --   ALBUMIN 2.8* 2.3*    CBG: Recent Labs  Lab 04/12/20 1006 04/13/20 1655 04/13/20 2015 04/14/20 0239 04/14/20 0515  GLUCAP 83 94 105* 113* 101*     Recent Results (from the past 240 hour(s))  SARS Coronavirus 2 by RT PCR (hospital order, performed in [facility] hospital  lab) Nasopharyngeal Nasopharyngeal Swab     Status: None   Collection Time: 04/11/20  3:58 AM   Specimen: Nasopharyngeal Swab  Result Value Ref Range Status   SARS Coronavirus 2 NEGATIVE NEGATIVE Final    Comment: (NOTE) SARS-CoV-2 target nucleic acids are NOT DETECTED.  The SARS-CoV-2 RNA is generally detectable in upper and lower respiratory specimens during the acute phase of infection. The lowest concentration of SARS-CoV-2 viral copies this assay can detect is 250 copies / mL. A negative result does not preclude SARS-CoV-2 infection and should not be used as the sole basis for treatment or other patient management decisions.  A negative result may occur with improper specimen collection / handling, submission of specimen other than nasopharyngeal swab, presence of viral mutation(s) within the areas targeted by this assay, and inadequate number of viral copies (<250 copies / mL). A negative result must be combined with clinical observations, patient history, and epidemiological information.  Fact Sheet for Patients:   StrictlyIdeas.no  Fact Sheet for Healthcare Providers: BankingDealers.co.za  This test is not yet approved or  cleared by the [place] FDA and has been authorized for detection and/or diagnosis of SARS-CoV-2 by FDA under an Emergency Use Authorization (EUA).  This EUA will remain in effect (meaning this test can be used) for the duration of the COVID-19 declaration under Section 564(b)(1) of the Act, 21 U.S.C. section 360bbb-3(b)(1), unless the authorization is terminated or revoked sooner.  Performed at [facility], [place] [address], [place],  [address]          Radiology Studies: [name]  Result Date: 04/12/2020 CLINICAL DATA:  Neuro deficit, acute, stroke suspected; concern for seizure versus stroke. EXAM: MRI HEAD WITHOUT CONTRAST TECHNIQUE: Multiplanar, multiecho pulse  sequences of the brain and surrounding structures were obtained without intravenous contrast. COMPARISON:  Head CT 04/12/2020, head CT 04/10/2020, brain MRI 02/13/2019 FINDINGS: Brain: Mild intermittent motion degradation. Stable, mild generalized parenchymal atrophy. 2 mm focus of subtle restricted diffusion within the left parietal periventricular white matter consistent with small acute or early subacute infarct (series 4, image 11). There is no convincing evidence of acute infarct elsewhere within the  brain. Stable moderate to advanced multifocal T2/FLAIR hyperintensity within the cerebral white matter which is nonspecific, but consistent with chronic small vessel ischemic disease. To a lesser degree, chronic small vessel ischemic changes are present within the pons. Redemonstrated chronic small-vessel infarct within the left corona radiata/basal ganglia. Small chronic lacunar infarcts also redemonstrated within the posterior right lentiform nucleus, thalami and left cerebellum. As before, there are few scattered chronic microhemorrhages within the basal ganglia, left thalamus and left cerebellum. This likely reflect sequela of chronic hypertensive microangiopathy. The hippocampi are symmetric in size and signal. No evidence of intracranial mass. No extra-axial fluid collection. No midline shift. Unchanged small arachnoid cyst overlying the anterior left frontal lobe. Vascular: Expected proximal arterial flow voids. Skull and upper cervical spine: No focal marrow lesion. Sinuses/Orbits: Visualized orbits show no acute finding. Paranasal sinus mucosal thickening. Most notably, there is severe left frontal sinus mucosal thickening and moderate mucosal thickening within the ethmoid, sphenoid and maxillary sinuses. Trace fluid within left mastoid air cells. Other: Nonspecific cystic lesion in along the left external ear measuring 7 mm (series 6, image 1) IMPRESSION: Mildly motion degraded examination. 2 mm acute or  early subacute infarct within the left parietal lobe periatrial white matter. Stable moderate to advanced chronic small vessel ischemic disease. Redemonstrated chronic small-vessel infarcts within the left corona radiata/basal ganglia, right basal ganglia, thalami and left cerebellum. Stable, mild generalized parenchymal atrophy. 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 Pansinusitis. Trace left mastoid effusion. Nonspecific 7 mm cystic appearing lesion along the left external ear. Direct visualization recommended. Electronically Signed   By: Kellie Simmering DO   On: 04/12/2020 12:10   IR Fluoro Guide CV Line Right  Result Date: 04/14/2020 CLINICAL DATA:  End-stage renal disease, needs access for hemodialysis EXAM: TUNNELED HEMODIALYSIS CATHETER PLACEMENT WITH ULTRASOUND AND FLUOROSCOPIC GUIDANCE TECHNIQUE: The procedure, risks, benefits, and alternatives were explained to the patient. Questions regarding the procedure were encouraged and answered. The patient understands and consents to the procedure. As antibiotic prophylaxis, cefazolin 2 g was ordered pre-procedure and administered intravenously within one hour of incision.Patency of the right IJ vein was confirmed with ultrasound with image documentation. An appropriate skin site was determined. Region was prepped using maximum barrier technique including cap and mask, sterile gown, sterile gloves, large sterile sheet, and Chlorhexidine as cutaneous antisepsis. The region was infiltrated locally with 1% lidocaine. Intravenous versed 1mg  were administered as conscious sedation during continuous monitoring of the patient's level of consciousness and physiological / cardiorespiratory status by the radiology RN, with a total moderate sedation time of 10 minutes. Under real-time ultrasound guidance, the right IJ vein was accessed with a 21 gauge micropuncture needle; the needle tip  within the vein was confirmed with ultrasound image documentation. Needle exchanged over the 018 guidewire for transitional dilator, which allowed advancement of a Benson wire into the IVC. Over this, an MPA catheter was advanced. A Palindrome 19 hemodialysis catheter was tunneled from the right anterior chest wall approach to the right IJ dermatotomy site. The MPA catheter was exchanged over an Amplatz wire for serial vascular dilators which allow placement of a peel-away sheath, through which the catheter was advanced under intermittent fluoroscopy, positioned with its tips in the proximal and midright atrium. Spot chest radiograph confirms good catheter position. No pneumothorax. Catheter was flushed and primed per protocol. Catheter secured externally with O Prolene sutures. The right IJ dermatotomy site was closed with Dermabond. COMPLICATIONS: COMPLICATIONS None immediate COMPARISON:  None IMPRESSION: 1. Technically successful placement of tunneled right IJ hemodialysis catheter with ultrasound and fluoroscopic guidance. Ready for routine use. ACCESS: Remains approachable for percutaneous intervention as needed. Electronically Signed   By: Lucrezia Europe M.D.   On: 04/14/2020 08:15   IR US Guide Vasc Access Right  Result Date: 04/14/2020 CLINICAL DATA:  End-stage renal disease, needs access for hemodialysis EXAM: TUNNELED HEMODIALYSIS CATHETER PLACEMENT WITH ULTRASOUND AND FLUOROSCOPIC GUIDANCE TECHNIQUE: The procedure, risks, benefits, and alternatives were explained to the patient. Questions regarding the procedure were encouraged and answered. The patient understands and consents to the procedure. As antibiotic prophylaxis, cefazolin 2 g was ordered pre-procedure and administered intravenously within one hour of incision.Patency of the right IJ vein was confirmed with ultrasound with image documentation. An appropriate skin site was determined. Region was prepped using maximum barrier technique including  cap and mask, sterile gown, sterile gloves, large sterile sheet, and Chlorhexidine as cutaneous antisepsis. The region was infiltrated locally with 1% lidocaine. Intravenous versed 1mg  were administered as conscious sedation during continuous monitoring of the patient's level of consciousness and physiological / cardiorespiratory status by the radiology RN, with a total moderate sedation time of 10 minutes. Under real-time ultrasound guidance, the right IJ vein was accessed with a 21 gauge micropuncture needle; the needle tip within the vein was confirmed with ultrasound image documentation. Needle exchanged over the 018 guidewire for transitional dilator, which allowed advancement of a Benson wire into the IVC. Over this, an MPA catheter was advanced. A Palindrome 19 hemodialysis catheter was tunneled from the right anterior chest wall approach to the right IJ dermatotomy site. The MPA catheter was exchanged over an Amplatz wire for serial vascular dilators which allow placement of a peel-away sheath, through which the catheter was advanced under intermittent fluoroscopy, positioned with its tips in the proximal and midright atrium. Spot chest radiograph confirms good catheter position. No pneumothorax. Catheter was flushed and primed per protocol. Catheter secured externally with O Prolene sutures. The right IJ dermatotomy site was closed with Dermabond. COMPLICATIONS: COMPLICATIONS None immediate COMPARISON:  None IMPRESSION: 1. Technically successful placement of tunneled right IJ hemodialysis catheter with ultrasound and fluoroscopic guidance. Ready for routine use. ACCESS: Remains approachable for percutaneous intervention as needed. Electronically Signed   By: Lucrezia Europe M.D.   On: 04/14/2020 08:15   DG Abd Portable 1V  Result Date: 04/13/2020 CLINICAL DATA:  Feeding tube placement. EXAM: PORTABLE ABDOMEN - 1 VIEW COMPARISON:  None. FINDINGS: A small bore feeding tube is noted with tip overlying the  proximal-mid stomach. No dilated bowel loops are identified. IMPRESSION: Small bore feeding tube with tip overlying the proximal-mid stomach. Electronically Signed   By: Margarette Canada M.D.   On: 04/13/2020 15:19   Overnight EEG with video  Result Date: 04/13/2020 Lora Havens, MD     04/13/2020 10:29 AM .Patient Name: Richard [name] MRN: [id number] Epilepsy Attending: Lora Havens Referring Physician/Provider: Dr Donnetta Simpers Duration: 04/12/2020 1255 to 04/13/2020 1000  Patient history: 64yo M with forced right gaze deviation concerning for seizure. EEG to evaluate for seizure.  Level of alertness: Awake, asleep  AEDs during EEG study: LEV, vimpat  Technical aspects: This EEG study was done with scalp electrodes positioned according to the 10-20 International system of electrode placement. Electrical activity was acquired at a sampling rate of 500Hz  and reviewed with a high frequency filter of 70Hz  and a low frequency filter of 1Hz . EEG data were recorded continuously and digitally stored.  Description: No posterior dominant rhythm was seen. Sleep was characterized by vertex waves, sleep spindles (12 to 14 Hz), maximal frontocentral region.  EEG showed continuous generalized rhythmic 3 to 6 Hz theta-delta slowing. Hyperventilation and photic stimulation were not performed.    ABNORMALITY -Continuous rhythmic slow, generalized  IMPRESSION: This study is suggestive of moderate diffuse encephalopathy, nonspecific etiology. No seizures or epileptiform discharges were seen throughout the recording.  Priyanka Barbra Sarks   EEG LTVM - Continuous Bedside W/ Video Includes Portable EEG Read  Result Date: 04/12/2020 Lora Havens, MD     04/12/2020  1:04 PM Patient Name: Richard [name] MRN: [id number] Epilepsy Attending: Lora Havens Referring Physician/Provider: Dr Donnetta Simpers Date: 04/12/2020 Duration: 25.21 mins Patient history: 64yo M with forced right gaze deviation  concerning for seizure. EEG to evaluate for seizure. Level of alertness: Awake, asleep AEDs during EEG study: LEV, vimpat Technical aspects: This EEG study was done with scalp electrodes positioned according to the 10-20 International system of electrode placement. Electrical activity was acquired at a sampling rate of 500Hz  and reviewed with a high frequency filter of 70Hz  and a low frequency filter of 1Hz . EEG data were recorded continuously and digitally stored. Description: No posterior dominant rhythm was seen. Sleep was characterized by vertex waves, sleep spindles (12 to 14 Hz), maximal frontocentral region.  EEG showed continuous generalized rhythmic 3 to 6 Hz theta-delta slowing. Hyperventilation and photic stimulation were not performed.   ABNORMALITY -Continuous rhythmic slow, generalized IMPRESSION: This study is suggestive of moderate diffuse encephalopathy, nonspecific etiology. No seizures or epileptiform discharges were seen throughout the recording. Lora Havens   CT HEAD CODE STROKE WO CONTRAST  Addendum Date: 04/12/2020   ADDENDUM REPORT: 04/12/2020 10:49 ADDENDUM: These results were called by telephone at the time of interpretation on 04/12/2020 at 10:49 am to provider Sal, who verbally acknowledged these results. Electronically Signed   By: Franchot Gallo M.D.   On: 04/12/2020 10:49   Result Date: 04/12/2020 CLINICAL DATA:  Code stroke. Acute neuro deficit. Rule out stroke. Right-sided gaze preference. EXAM: CT HEAD WITHOUT CONTRAST TECHNIQUE: Contiguous axial images were obtained from the base of the skull through the vertex without intravenous contrast. COMPARISON:  CT head 04/10/2020.  MRI head 02/13/2020 FINDINGS: Brain: Generalized atrophy. Prominent white matter hypodensity bilaterally similar to prior studies compatible with chronic microvascular ischemia. Findings stable compared with the prior study. Negative for acute hemorrhage or mass. Vascular: Negative for hyperdense  vessel Skull: No focal skeletal lesion. Sinuses/Orbits: Moderate to extensive mucosal edema paranasal sinuses. Negative orbit Other: None ASPECTS ([place] Stroke Program Early CT Score) - Ganglionic level infarction (caudate, lentiform nuclei, internal capsule, insula, M1-M3 cortex): 7 - Supraganglionic infarction (M4-M6 cortex): 3 Total score (0-10 with 10 being normal): 10 IMPRESSION: 1. Extensive white matter changes consistent with chronic microvascular ischemia. This is stable from prior studies. No acute abnormality. 2. ASPECTS is 10 Electronically Signed: By: Franchot Gallo M.D. On: 04/12/2020 10:40   VAS [place] UPPER EXT VEIN MAPPING (PRE-OP AVF)  Result Date: 04/13/2020 UPPER EXTREMITY VEIN MAPPING  Indications: Pre-access. Comparison Study: no priro Performing Technologist: Abram Sander RVS  Examination Guidelines: A complete evaluation includes B-mode imaging, spectral Doppler, color Doppler, and power Doppler as needed of all accessible portions of each vessel. Bilateral testing is considered an integral part of a complete examination. Limited examinations for reoccurring indications may be performed as noted. +-------------+-------------+----------+--------+ Left CephalicDiameter (cm)Depth (cm)Findings +-------------+-------------+----------+--------+  +-----------------+-------------+----------+---------+ Left Cephalic  Diameter (cm)Depth (cm)Findings  +-----------------+-------------+----------+---------+ Shoulder             0.43        0.64             +-----------------+-------------+----------+---------+ Prox upper arm       0.36        0.42             +-----------------+-------------+----------+---------+ Mid upper arm        0.36        0.40             +-----------------+-------------+----------+---------+ Dist upper arm       0.40        0.33   branching +-----------------+-------------+----------+---------+ Antecubital fossa    0.48        0.80              +-----------------+-------------+----------+---------+ Prox forearm         0.20        0.48   branching +-----------------+-------------+----------+---------+ Mid forearm          0.11        0.39             +-----------------+-------------+----------+---------+ Dist forearm         0.14        0.24             +-----------------+-------------+----------+---------+ +-----------------+-------------+----------+--------+ Left Basilic     Diameter (cm)Depth (cm)Findings +-----------------+-------------+----------+--------+ Prox upper arm       0.43        0.77            +-----------------+-------------+----------+--------+ Mid upper arm        0.33        0.79            +-----------------+-------------+----------+--------+ Dist upper arm       0.29        0.72            +-----------------+-------------+----------+--------+ Antecubital fossa    0.34        0.68            +-----------------+-------------+----------+--------+ Prox forearm         0.29        0.19            +-----------------+-------------+----------+--------+ Mid forearm          0.27        0.27            +-----------------+-------------+----------+--------+ Distal forearm       0.28        0.32            +-----------------+-------------+----------+--------+ Wrist                0.16        0.32            +-----------------+-------------+----------+--------+ *See table(s) above for measurements and observations.  Diagnosing physician: Monica Martinez MD Electronically signed by Monica Martinez MD on 04/13/2020 at 4:31:20 PM.    Final         Scheduled Meds: . Chlorhexidine Gluconate Cloth  6 each Topical Q0600  . clopidogrel  75 mg Oral Daily  . darbepoetin (ARANESP) injection - DIALYSIS  40 mcg Intravenous Q Fri-HD  . feeding supplement (PROSource TF)  45 mL Per Tube Daily  . heparin  5,000 Units Subcutaneous Q8H  . heparin sodium (porcine)      . insulin aspart  0-9  Units Subcutaneous Q4H  . LORazepam  2 mg Intravenous Once  . pantoprazole  40 mg Oral Daily  . pravastatin  80 mg Oral QHS  . tamsulosin  0.4 mg Oral Daily   Continuous Infusions: . feeding supplement (NEPRO CARB STEADY)    . lacosamide (VIMPAT) IV 50 mg (04/13/20 2227)  . levETIRAcetam 500 mg (04/13/20 2154)     LOS: 4 days      Labs: Basic Metabolic Panel: Recent Labs  Lab 04/10/20 2037 04/11/20 0018 04/11/20 0402 04/12/20 0434 04/13/20 1113 04/13/20 1829 04/14/20 0511  NA 143  --  145 143 148*  --   --   K 4.2  --  4.4 4.4 4.5  --   --   CL 115*  --  117* 116* 119*  --   --   CO2 13*  --  11* 13* 12*  --   --   GLUCOSE 106*  --  94 88 68*  --   --   BUN 76*  --  78* 86* 94*  --   --   CREATININE 9.98* 9.99* 9.99* 10.66* 11.51*  --   --   CALCIUM 7.5*  --  7.6* 7.5* 8.1*  --   --   MG  --   --   --   --   --  1.6* 1.5*  PHOS  --   --   --   --  7.6* 8.3* 4.4   Liver Function Tests: Recent Labs  Lab 04/10/20 2037 04/13/20 1113  AST 13*  --   ALT 10  --   ALKPHOS 95  --   BILITOT 0.5  --   PROT 6.6  --   ALBUMIN 2.8* 2.3*   No results for input(s): LIPASE, AMYLASE in the last 168 hours. No results for input(s): AMMONIA in the last 168 hours. CBC: Recent Labs  Lab 04/10/20 2037 04/11/20 0018 04/11/20 0402 04/12/20 0434  WBC 6.6 6.8 9.1 6.5  NEUTROABS 4.7  --   --  5.0  HGB 10.3* 11.4* 9.7* 8.2*  HCT 31.5* 34.5* 29.4* 24.5*  MCV 90.0 87.6 89.4 88.1  PLT 192 201 195 173   Cardiac Enzymes: No results for input(s): CKTOTAL, CKMB, CKMBINDEX, TROPONINI in the last 168 hours. BNP: BNP (last 3 results) No results for input(s): BNP in the last 8760 hours.  ProBNP (last 3 results) Recent Labs    06/22/19 1505  PROBNP 170.0*    CBG: Recent Labs  Lab 04/12/20 1006 04/13/20 1655 04/13/20 2015 04/14/20 0239 04/14/20 0515  GLUCAP 83 94 105* 113* 101*       Signed:  Florencia Reasons MD, PhD, FACP  [facility] 04/14/2020, 8:24 AM  Time  spent more than 35 minutes case discussed with nephrology, neurology, ID and critical care I have personally reviewed and interpreted on  04/14/2020 daily labs, tele strips, imagings as discussed above under date review session and assessment and plans.  I reviewed all nursing notes, pharmacy notes, consultant notes,  vitals, pertinent old records  I have discussed plan of care as described above with RN , patient on 04/14/2020  Voice Recognition /Dragon dictation system was used to create this note, attempts have been made to correct errors. Please contact the author with questions and/or clarifications.   Florencia Reasons, MD PhD FACP [facility]  Available via Epic secure chat 7am-7pm for nonurgent issues Please page for urgent issues To page the attending provider between 7A-7P or the  covering provider during after hours 7P-7A, please log into the web site [email or web address] and access using universal [place] password for that web site. If you do not have the password, please call the hospital operator.    04/14/2020, 8:24 AM

## 2020-04-14 NOTE — Progress Notes (Signed)
Subjective:  He is complaining of pain in his right side  Antibiotics:  Anti-infectives (From admission, onward)   Start     Dose/Rate Route Frequency Ordered Stop   04/13/20 1737  ceFAZolin (ANCEF) 2-4 GM/100ML-% IVPB       Note to Pharmacy: Desiree Hane   : cabinet override      04/13/20 1737 04/14/20 0544   04/13/20 0000  ceFAZolin (ANCEF) IVPB 2g/100 mL premix        2 g 200 mL/hr over 30 Minutes Intravenous To Radiology 04/12/20 1454 04/13/20 0834      Medications: Scheduled Meds: . Chlorhexidine Gluconate Cloth  6 each Topical Q0600  . [START ON 04/15/2020] clopidogrel  75 mg Per Tube Daily  . darbepoetin (ARANESP) injection - DIALYSIS  40 mcg Intravenous Q Fri-HD  . feeding supplement (PROSource TF)  45 mL Per Tube Daily  . heparin  5,000 Units Subcutaneous Q8H  . heparin sodium (porcine)      . insulin aspart  0-9 Units Subcutaneous Q4H  . LORazepam  2 mg Intravenous Once  . [START ON 04/15/2020] pantoprazole sodium  40 mg Per Tube Daily  . pravastatin  80 mg Per Tube QHS  . tamsulosin  0.4 mg Oral Daily   Continuous Infusions: . feeding supplement (NEPRO CARB STEADY)    . lacosamide (VIMPAT) IV 50 mg (04/14/20 1101)  . levETIRAcetam 500 mg (04/14/20 0936)   PRN Meds:.hydrALAZINE, labetalol    Objective: Weight change:   Intake/Output Summary (Last 24 hours) at 04/14/2020 1213 Last data filed at 04/14/2020 0201 Gross per 24 hour  Intake --  Output 417 ml  Net -417 ml   Blood pressure (!) 171/76, pulse 90, temperature 98.3 F (36.8 C), temperature source Oral, resp. rate 15, weight 69.1 kg, SpO2 99 %. Temp:  [98 F (36.7 C)-99.1 F (37.3 C)] 98.3 F (36.8 C) (07/24 1103) Pulse Rate:  [87-104] 90 (07/24 1103) Resp:  [15-20] 15 (07/24 1103) BP: (134-191)/(74-105) 171/76 (07/24 1103) SpO2:  [96 %-100 %] 99 % (07/24 1103) Weight:  [69.1 kg] 69.1 kg (07/24 0500)  Physical Exam: General: Awake and can tell me his first and last  name. HEENT: anicteric sclera, EOMI CVS tachycardic no murmurs gallops rubs l  Chest: , no wheezing, no respiratory distress lungs clear Abdomen: soft non-distended,  Extremities: Right upper extremity with contracture Skin: Diffuse rash that is concerning for possible disseminated cryptococcal infection Neuro: Right upper extremity with contracture  CBC:    BMET Recent Labs    04/12/20 0434 04/13/20 1113  NA 143 148*  K 4.4 4.5  CL 116* 119*  CO2 13* 12*  GLUCOSE 88 68*  BUN 86* 94*  CREATININE 10.66* 11.51*  CALCIUM 7.5* 8.1*     Liver Panel  Recent Labs    04/13/20 1113  ALBUMIN 2.3*       Sedimentation Rate No results for input(s): ESRSEDRATE in the last 72 hours. C-Reactive Protein No results for input(s): CRP in the last 72 hours.  Micro Results: Recent Results (from the past 720 hour(s))  SARS Coronavirus 2 by RT PCR (hospital order, performed in [facility] hospital lab) Nasopharyngeal Nasopharyngeal Swab     Status: None   Collection Time: 04/11/20  3:58 AM   Specimen: Nasopharyngeal Swab  Result Value Ref Range Status   SARS Coronavirus 2 NEGATIVE NEGATIVE Final    Comment: (NOTE) SARS-CoV-2 target nucleic acids are NOT DETECTED.  The SARS-CoV-2 RNA  is generally detectable in upper and lower respiratory specimens during the acute phase of infection. The lowest concentration of SARS-CoV-2 viral copies this assay can detect is 250 copies / mL. A negative result does not preclude SARS-CoV-2 infection and should not be used as the sole basis for treatment or other patient management decisions.  A negative result may occur with improper specimen collection / handling, submission of specimen other than nasopharyngeal swab, presence of viral mutation(s) within the areas targeted by this assay, and inadequate number of viral copies (<250 copies / mL). A negative result must be combined with clinical observations, patient history, and epidemiological  information.  Fact Sheet for Patients:   StrictlyIdeas.no  Fact Sheet for Healthcare Providers: BankingDealers.co.za  This test is not yet approved or  cleared by the [place] FDA and has been authorized for detection and/or diagnosis of SARS-CoV-2 by FDA under an Emergency Use Authorization (EUA).  This EUA will remain in effect (meaning this test can be used) for the duration of the COVID-19 declaration under Section 564(b)(1) of the Act, 21 U.S.C. section 360bbb-3(b)(1), unless the authorization is terminated or revoked sooner.  Performed at [facility], [place] [address] [place], [place] [address]     Studies/Results: IR Fluoro Guide CV Line Right  Result Date: 04/14/2020 CLINICAL DATA:  End-stage renal disease, needs access for hemodialysis EXAM: TUNNELED HEMODIALYSIS CATHETER PLACEMENT WITH ULTRASOUND AND FLUOROSCOPIC GUIDANCE TECHNIQUE: The procedure, risks, benefits, and alternatives were explained to the patient. Questions regarding the procedure were encouraged and answered. The patient understands and consents to the procedure. As antibiotic prophylaxis, cefazolin 2 g was ordered pre-procedure and administered intravenously within one hour of incision.Patency of the right IJ vein was confirmed with ultrasound with image documentation. An appropriate skin site was determined. Region was prepped using maximum barrier technique including cap and mask, sterile gown, sterile gloves, large sterile sheet, and Chlorhexidine as cutaneous antisepsis. The region was infiltrated locally with 1% lidocaine. Intravenous versed 1mg  were administered as conscious sedation during continuous monitoring of the patient's level of consciousness and physiological / cardiorespiratory status by the radiology RN, with a total moderate sedation time of 10 minutes. Under real-time ultrasound guidance, the right IJ vein was accessed with a 21 gauge  micropuncture needle; the needle tip within the vein was confirmed with ultrasound image documentation. Needle exchanged over the 018 guidewire for transitional dilator, which allowed advancement of a Benson wire into the IVC. Over this, an MPA catheter was advanced. A Palindrome 19 hemodialysis catheter was tunneled from the right anterior chest wall approach to the right IJ dermatotomy site. The MPA catheter was exchanged over an Amplatz wire for serial vascular dilators which allow placement of a peel-away sheath, through which the catheter was advanced under intermittent fluoroscopy, positioned with its tips in the proximal and midright atrium. Spot chest radiograph confirms good catheter position. No pneumothorax. Catheter was flushed and primed per protocol. Catheter secured externally with O Prolene sutures. The right IJ dermatotomy site was closed with Dermabond. COMPLICATIONS: COMPLICATIONS None immediate COMPARISON:  None IMPRESSION: 1. Technically successful placement of tunneled right IJ hemodialysis catheter with ultrasound and fluoroscopic guidance. Ready for routine use. ACCESS: Remains approachable for percutaneous intervention as needed. Electronically Signed   By: Lucrezia Europe M.D.   On: 04/14/2020 08:15   IR US Guide Vasc Access Right  Result Date: 04/14/2020 CLINICAL DATA:  End-stage renal disease, needs access for hemodialysis EXAM: TUNNELED HEMODIALYSIS CATHETER PLACEMENT WITH ULTRASOUND AND FLUOROSCOPIC  GUIDANCE TECHNIQUE: The procedure, risks, benefits, and alternatives were explained to the patient. Questions regarding the procedure were encouraged and answered. The patient understands and consents to the procedure. As antibiotic prophylaxis, cefazolin 2 g was ordered pre-procedure and administered intravenously within one hour of incision.Patency of the right IJ vein was confirmed with ultrasound with image documentation. An appropriate skin site was determined. Region was prepped using  maximum barrier technique including cap and mask, sterile gown, sterile gloves, large sterile sheet, and Chlorhexidine as cutaneous antisepsis. The region was infiltrated locally with 1% lidocaine. Intravenous versed 1mg  were administered as conscious sedation during continuous monitoring of the patient's level of consciousness and physiological / cardiorespiratory status by the radiology RN, with a total moderate sedation time of 10 minutes. Under real-time ultrasound guidance, the right IJ vein was accessed with a 21 gauge micropuncture needle; the needle tip within the vein was confirmed with ultrasound image documentation. Needle exchanged over the 018 guidewire for transitional dilator, which allowed advancement of a Benson wire into the IVC. Over this, an MPA catheter was advanced. A Palindrome 19 hemodialysis catheter was tunneled from the right anterior chest wall approach to the right IJ dermatotomy site. The MPA catheter was exchanged over an Amplatz wire for serial vascular dilators which allow placement of a peel-away sheath, through which the catheter was advanced under intermittent fluoroscopy, positioned with its tips in the proximal and midright atrium. Spot chest radiograph confirms good catheter position. No pneumothorax. Catheter was flushed and primed per protocol. Catheter secured externally with O Prolene sutures. The right IJ dermatotomy site was closed with Dermabond. COMPLICATIONS: COMPLICATIONS None immediate COMPARISON:  None IMPRESSION: 1. Technically successful placement of tunneled right IJ hemodialysis catheter with ultrasound and fluoroscopic guidance. Ready for routine use. ACCESS: Remains approachable for percutaneous intervention as needed. Electronically Signed   By: Lucrezia Europe M.D.   On: 04/14/2020 08:15   DG Abd Portable 1V  Result Date: 04/13/2020 CLINICAL DATA:  Feeding tube placement. EXAM: PORTABLE ABDOMEN - 1 VIEW COMPARISON:  None. FINDINGS: A small bore feeding tube  is noted with tip overlying the proximal-mid stomach. No dilated bowel loops are identified. IMPRESSION: Small bore feeding tube with tip overlying the proximal-mid stomach. Electronically Signed   By: Margarette Canada M.D.   On: 04/13/2020 15:19   Overnight EEG with video  Result Date: 04/13/2020 Lora Havens, MD     04/14/2020 11:04 AM .Patient Name: Richard [name] MRN: [id number] Epilepsy Attending: Lora Havens Referring Physician/Provider: Dr Donnetta Simpers Duration: 04/12/2020 1255 to 04/13/2020 1255  Patient history: 64yo M with forced right gaze deviation concerning for seizure. EEG to evaluate for seizure.  Level of alertness: Awake, asleep  AEDs during EEG study: LEV, vimpat  Technical aspects: This EEG study was done with scalp electrodes positioned according to the 10-20 International system of electrode placement. Electrical activity was acquired at a sampling rate of 500Hz  and reviewed with a high frequency filter of 70Hz  and a low frequency filter of 1Hz . EEG data were recorded continuously and digitally stored.  Description: No posterior dominant rhythm was seen. Sleep was characterized by vertex waves, sleep spindles (12 to 14 Hz), maximal frontocentral region.  EEG showed continuous generalized rhythmic 3 to 6 Hz theta-delta slowing. Hyperventilation and photic stimulation were not performed.    ABNORMALITY -Continuous rhythmic slow, generalized  IMPRESSION: This study is suggestive of moderate diffuse encephalopathy, nonspecific etiology. No seizures or epileptiform discharges were seen throughout the recording.  Priyanka  Barbra Sarks   EEG LTVM - Continuous Bedside W/ Video Includes Portable EEG Read  Result Date: 04/12/2020 Lora Havens, MD     04/12/2020  1:04 PM Patient Name: Richard [name] MRN: [id number] Epilepsy Attending: Lora Havens Referring Physician/Provider: Dr Donnetta Simpers Date: 04/12/2020 Duration: 25.21 mins Patient history: 64yo M with  forced right gaze deviation concerning for seizure. EEG to evaluate for seizure. Level of alertness: Awake, asleep AEDs during EEG study: LEV, vimpat Technical aspects: This EEG study was done with scalp electrodes positioned according to the 10-20 International system of electrode placement. Electrical activity was acquired at a sampling rate of 500Hz  and reviewed with a high frequency filter of 70Hz  and a low frequency filter of 1Hz . EEG data were recorded continuously and digitally stored. Description: No posterior dominant rhythm was seen. Sleep was characterized by vertex waves, sleep spindles (12 to 14 Hz), maximal frontocentral region.  EEG showed continuous generalized rhythmic 3 to 6 Hz theta-delta slowing. Hyperventilation and photic stimulation were not performed.   ABNORMALITY -Continuous rhythmic slow, generalized IMPRESSION: This study is suggestive of moderate diffuse encephalopathy, nonspecific etiology. No seizures or epileptiform discharges were seen throughout the recording. Priyanka O Yadav   VAS [place] UPPER EXT VEIN MAPPING (PRE-OP AVF)  Result Date: 04/13/2020 UPPER EXTREMITY VEIN MAPPING  Indications: Pre-access. Comparison Study: no priro Performing Technologist: Abram Sander RVS  Examination Guidelines: A complete evaluation includes B-mode imaging, spectral Doppler, color Doppler, and power Doppler as needed of all accessible portions of each vessel. Bilateral testing is considered an integral part of a complete examination. Limited examinations for reoccurring indications may be performed as noted. +-------------+-------------+----------+--------+ Left CephalicDiameter (cm)Depth (cm)Findings +-------------+-------------+----------+--------+  +-----------------+-------------+----------+---------+ Left Cephalic    Diameter (cm)Depth (cm)Findings  +-----------------+-------------+----------+---------+ Shoulder             0.43        0.64              +-----------------+-------------+----------+---------+ Prox upper arm       0.36        0.42             +-----------------+-------------+----------+---------+ Mid upper arm        0.36        0.40             +-----------------+-------------+----------+---------+ Dist upper arm       0.40        0.33   branching +-----------------+-------------+----------+---------+ Antecubital fossa    0.48        0.80             +-----------------+-------------+----------+---------+ Prox forearm         0.20        0.48   branching +-----------------+-------------+----------+---------+ Mid forearm          0.11        0.39             +-----------------+-------------+----------+---------+ Dist forearm         0.14        0.24             +-----------------+-------------+----------+---------+ +-----------------+-------------+----------+--------+ Left Basilic     Diameter (cm)Depth (cm)Findings +-----------------+-------------+----------+--------+ Prox upper arm       0.43        0.77            +-----------------+-------------+----------+--------+ Mid upper arm        0.33        0.79            +-----------------+-------------+----------+--------+  Dist upper arm       0.29        0.72            +-----------------+-------------+----------+--------+ Antecubital fossa    0.34        0.68            +-----------------+-------------+----------+--------+ Prox forearm         0.29        0.19            +-----------------+-------------+----------+--------+ Mid forearm          0.27        0.27            +-----------------+-------------+----------+--------+ Distal forearm       0.28        0.32            +-----------------+-------------+----------+--------+ Wrist                0.16        0.32            +-----------------+-------------+----------+--------+ *See table(s) above for measurements and observations.  Diagnosing physician: Monica Martinez MD  Electronically signed by Monica Martinez MD on 04/13/2020 at 4:31:20 PM.    Final       Assessment/Plan:  INTERVAL HISTORY: Neurology to attempt LP shortly   Principal Problem:   Encephalopathy Active Problems:   Tobacco abuse   Protein-calorie malnutrition, mild ([place])   Hyperlipidemia   Neuropathy in diabetes ([place])   GERD (gastroesophageal reflux disease)   Diabetes mellitus, type II, insulin dependent ([facility])   CKD (chronic kidney disease) stage 5, GFR less than 15 ml/min ([facility])   History of cerebrovascular accident (CVA) with residual deficit   Seizure ([place])   AIDS (acquired immune deficiency syndrome) ([place])    Richard [name] is a 64 y.o. male with past medical history significant for poorly controlled diabetes and hypertension with progression of his chronic kidney disease, prior stroke who was admitted with seizures and encephalopathy and found to have a parietal infarct.  He also has been found to have HIV and AIDS with a CD4 count of 39.  He has a diffuse rash that is concerning for possible disseminated cryptococcal infection versus histoplasma Blastomyces  #1  Central nervous system infarct with encephalopathy: I am concerned that he could have disseminated cryptococcal infection.  His serum cryptococcal antigen was negative  However he clearly needs a lumbar puncture and neurology has graciously agreed to attempt this.   Please obtain high-volume lumbar puncture with documentation of opening pressure  Send CSF for stat cryptococcal antigen, protein, glucose, CSF cell count and differential  CSF culture CSF fungal culture CSF VDRL and save remaining CSF  Please remove sufficient CSF to drop open closing pressure to below 20  If neurology cannot obtain lumbar puncture we should get IR quickly involved to obtain an LP because he needs this done as soon as possible.  2.  HIV and AIDS: The patient stated that he did was not aware of having HIV when I talked to  him today.  I do not know if this diagnosis been revealed to his daughter yet but it certainly is an unfortunately highly stigmatized virus and infection for those to live with it.  I have not initiated conversations with the daughter about it yet in the hopes that the patient might recover more neurologically and be able to consent to having it is diagnosis shared or if he does not  wish to share it do not do so.  I certainly would like to initiate antiretroviral therapy but if he has a centralnervous system opportunistic infection that is contraindicated.  You also need to be placed on PCP prophylaxis I am holding off now pending lumbar puncture  3.  Chronic kidney disease that is progressed to end-stage renal disease.  He now has a dialysis catheter in my understanding his dialysis is pending       LOS: 4 days   Alcide Evener 04/14/2020, 12:13 PM

## 2020-04-14 NOTE — Progress Notes (Signed)
Patient is back on the unit from dialysis.

## 2020-04-14 NOTE — Procedures (Signed)
Patient Name:Richard [name] RDE:[id number] Epilepsy Attending:Amesha Bailey Barbra Sarks Referring Physician/Provider:Dr Donnetta Simpers Duration:04/13/2020 1255 to 04/14/2020 0813  Patient [id number] M with forced right gaze deviation concerning for seizure. EEG to evaluate for seizure.  Level of alertness:Awake, asleep  AEDs during EEG study:LEV, vimpat  Technical aspects: This EEG study was done with scalp electrodes positioned according to the 10-20 International system of electrode placement. Electrical activity was acquired at a sampling rate of 500Hz  and reviewed with a high frequency filter of 70Hz  and a low frequency filter of 1Hz . EEG data were recorded continuously and digitally stored.   Description:Noposterior dominant rhythmwas seen.Sleep was characterized by vertex waves, sleep spindles (12 to 14 Hz), maximal frontocentral region. EEG showed intermittent generalized 5-6hz  theta slowing as well as intermittent rhythmic2-3 Hz delta slowing.Hyperventilation and photic stimulation were not performed.   ABNORMALITY -Continuous slow, generalized  IMPRESSION: This study is suggestive of moderate diffuse encephalopathy, nonspecific etiology.No seizures or epileptiform discharges were seen throughout the recording.  Tanielle Emigh Barbra Sarks

## 2020-04-14 NOTE — Progress Notes (Signed)
Pt returned to unit after having dialysis and going to [facility] for lumbar puncture. Received report from dialysis nurse and also Gae Bon in [place]. L.P. was successful. Plan is for patient to lie supine until approx 2200. Tube feeds are being held until that time. VSS at this time.

## 2020-04-14 NOTE — Procedures (Signed)
Fluoro guided LP performed at L2-L3.  32 ml clear CSF obtained.  Opening pressure 18 cc H2O, closing pressure unmeasurable.  No complications.

## 2020-04-14 NOTE — Progress Notes (Signed)
   04/14/20 0201  Hand-Off documentation  Handoff Given Given to shift RN/LPN  Report given to (Full Name) Donnamarie Rossetti, RN  Handoff Received Received from shift RN/LPN  Report received from (Full Name) Norrin Shreffler, RN  Vital Signs  Temp 98.6 F (37 C)  Temp Source Oral  Pulse Rate 87  Pulse Rate Source Monitor  Resp 19  BP (!) 155/74  BP Location Left Arm  BP Method Automatic  Patient Position (if appropriate) Lying  Oxygen Therapy  SpO2 100 %  O2 Device Room Air  Pain Assessment  Pain Scale 0-10  Pain Score Asleep  Post-Hemodialysis Assessment  Rinseback Volume (mL) 250 mL  KECN 196 V  Dialyzer Clearance Lightly streaked  Duration of HD Treatment -hour(s) 2 hour(s)  Hemodialysis Intake (mL) 500 mL  UF Total -Machine (mL) 517 mL  Net UF (mL) 17 mL  Tolerated HD Treatment Yes  Post-Hemodialysis Comments tolerated the HD tx very well with no complaints.  AVG/AVF Arterial Site Held (minutes) 0 minutes  AVG/AVF Venous Site Held (minutes) 0 minutes  Hemodialysis Catheter Right Internal jugular Double lumen Permanent (Tunneled)  Placement Date/Time: 04/13/20 1747   Placed prior to admission: No  Time Out: Correct patient;Correct procedure;Correct site  Maximum sterile barrier precautions: Hand hygiene;Sterile gown;Cap;Sterile gloves;Mask;Large sterile sheet  Site Prep: Chlorh...  Site Condition No complications  Blue Lumen Status Heparin locked;Capped (Central line)  Red Lumen Status Heparin locked;Capped (Central line)  Catheter fill solution Heparin 1000 units/ml  Catheter fill volume (Arterial) 1.6 cc  Catheter fill volume (Venous) 1.6  Dressing Type Gauze/Drain sponge  Dressing Status Clean;Dry;Intact  Drainage Description None

## 2020-04-14 NOTE — Progress Notes (Signed)
Progress note  Subjective: He is much improved today after dialysis yesterday.  Denies any pain.  He has right-sided weakness but denies any left-sided weakness.  Objective:  Temp:  [98 F (36.7 C)-99.1 F (37.3 C)] 98.3 F (36.8 C) (07/24 1103) Pulse Rate:  [87-104] 90 (07/24 1103) Resp:  [15-20] 15 (07/24 1103) BP: (134-191)/(74-105) 171/76 (07/24 1103) SpO2:  [96 %-100 %] 99 % (07/24 1103) Weight:  [69.1 kg] 69.1 kg (07/24 0500) Body mass index is 20.66 kg/m.  General: Laying comfortably in bed; in no acute distress. HENT: Normal oropharynx and mucosa. Normal external appearance of ears and nose Neck: Supple, no pain or tenderness. CV: RRR without murmur. No peripheral edema. Pulmonary: Symmetric chest rise. Normal respiratoy effort. Abdomen: positive bowel sounds, soft, non-tender. Normal liver and spleen size.  Ext: No cyanosis, edema, or deformity.  Skin: No rash. Normal palpation of skin.  Musculoskeletal: Normal digits and nails by inspection. No Clubbing.    Neurologic Examination  MENTAL STATUS: somenolent but awakens to command.  Oriented to time place and person.  Goes right back to sleep. LANG/SPEECH: Naming and comprehension intact and follows one-step commands.  Dysarthric speech CRANIAL NERVES: II: Pupils equal and reactive III, IV, VI: EOM intact, no gaze preference or deviation, no nystagmus. VII: Right facial droop  VIII: normal hearing to speech XII: midline tongue protrusion  Motor exam: Somnolence prevents a detailed assessment of his strength Left upper extremity with a 5 out of 5 hand grip. Right upper extremity motor 2 out of 5 with contractures at the elbow. Lifts left lower extremity off the bed and can keep it up in the air about 5 seconds Lifts right lower extremity off the bed but it drifts down.   REFLEXES: Increased in right upper extremity and right lower extremity 2 or 3.  To and left upper extremity and left lower  extremity. SENSORY: Normal to touch in all extremities. No hemineglect, no extinction to double sided stimulation (visual & tactile) Coordination: Unable to assess due to somnolence.  Imaging/Labs/Diagnostics:  CTH: No acute abnoramlity MRI Brain with a tiny acute infarct in Left parietal lobe and advanced white matter disease.  Impression and plan: Richard [name] is a 64 y.o. male with PMH significant for arthritis, Diabetes mellitus, type II, insulin dependent ([place]), GERD (gastroesophageal reflux disease), Hypertension, Hypertensive emergency (05/21/2012), Immune deficiency disorder ([place]), Migraines, Neuropathy in diabetes ([place]), Ruptured lumbar disc, ESRD declined to go on dialysis and Stroke with residual R sided weakness c/b Seizures and non compliance with Keppra who was admitted with breakthrough seizures in the setting of non compliance of Keppra. Continues EEG for about 48 hours with no seizures.  MRI brain with a tiny 2 mm infarct in the left parietal lobe which does not explain his presentation. He underwent dialysis yesterday and is significantly improved today.  Although metabolic/toxic encephalopathy in the setting of end-stage renal disease would not explain the right gaze deviation that we noted on him earlier.  Recs: - Attemtped LP at bedside but was unsuccessful. -I have ordered CSF cell count, differential, protein, glucose, VDRL, and aerobic and fungal cultures, CSF Gram stain, HSV PCR on CSF. - Continue Vimpat 100mg  BID and continuing home Keppra 500mg  BID - Ativan for seizure lasting more than 2 mins. - cEEG was disconnected.  Thank you for the opportunity to take part in the care of this patient. If you have any further questions, please contact the neurology consultation attending on call. Signed,  Donnetta Simpers

## 2020-04-14 NOTE — Progress Notes (Signed)
Dr. Erlinda Hong paged about pt's BP still being elevated. 0903 BP was 179/93, hydralazine given at 0927. 1103 BP 171/76. Order parameters changed by Dr. Erlinda Hong to give labetalol every 2 hours for BP >170. [place]

## 2020-04-14 NOTE — Procedures (Signed)
Indication:  Risks of the procedure were dicussed with the patient;s daughter [name]) over the phone with patient's bedside RN being the witness including post-LP headache, bleeding, infection, weakness/numbness of legs(radiculopathy), death.  The patient/patient's proxy agreed and verbal consent was obtained.   The patient was prepped and draped, and using sterile technique a 20 gauge quinke spinal needle was inserted in the L3-4 space.  Unfortunately, I was not able to collect any CSF sample.  [place] Pager Number [phone]

## 2020-04-14 NOTE — H&P (Signed)
Palliative Medicine Inpatient Consult Note  Reason for consult:   HPI: 64 year old male with medical history of Arthritis, Diabetes mellitus, type II, insulin dependent, GERD, HTN, Immune deficiency disorder, migraines, neuropathy in diabetes, ruptured lumbar disc, ESRD stage V , GFR < 15 ml/min, declined  Dialysis in the past, CVA with residual right sided weakness, seizures admitted 7/20 through ED with seizure activity lasting 4 minutes per roommate, and encephalopathy. He was started back on Keppra and hypertension was treated with labetalol and hydralazine. MRI of brain showed 2 mm infarct in left parietal lobe. A tunneled dialysis catheter was placed and  Dialysis was started on 7/23 with imporvement. 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 Diffuse rash present concerning for possible disseminated cryptococcal infection. He is awaiting LP by IR.  Clinical Assessment/Goals of Care: I have reviewed medical records including EPIC notes, labs and imaging, received report from bedside RN, assessed the patient.    I met with patient to further discuss diagnosis prognosis, GOC, EOL wishes, disposition and options. I had an appointment with daughter [name] to meet at 55 today in room  but she did not arrive until much later. Nursing called me but when I arrived she had left and she has not returned phone calls.   I introduced Palliative Medicine as specialized medical care for people living with serious illness. It focuses on providing relief from the symptoms and stress of a serious illness. The goal is to improve quality of life for both the patient and the family.  I attempted a discussion  today regarding advanced directives with the patient. At my first visit he ws confused. At my second visit he was oriented. His nurse was present and he was clear that he would want Chest compressions and Shocks but is not sure about intubation. He fell asleep  before we could continue our conversation more.  Concepts specific to artifical feeding and hydration, continued IV antibiotics and rehospitalization still need to be had.  Values and goals of care important to patient were attempted to be elicited.  Decision Maker: patient Patient complains of being hungry but denies any other concerns. Tube feedings were started last night.   SUMMARY OF RECOMMENDATIONS    Code Status/Advance Care Planning: FULL CODE currently, will need to be readdressed  Palliative Prophylaxis:   Oral care  Additional Recommendations (Limitations, Scope, Preferences):  From medical record it is unclear if daughter knows his HIV status. Will need to discuss with patient.  Psycho-social/Spiritual:   Desire for further Chaplaincy support: unclear  Additional Recommendations: Patient support for new diagnosis   Prognosis:  unclear  Discharge Planning: To be determined    Vitals with BMI 04/14/2020 04/14/2020 04/14/2020  Height - - -  Weight - - -  BMI - - -  Systolic 762 831 517  Diastolic 67 79 83  Pulse 98 94 98  Some encounter information is confidential and restricted. Go to Review Flowsheets activity to see all data.     PPS: 30%    Time In:10:30 Time Out:11:20 Total Time: 50 minutes Greater than 50%  of this time was spent counseling and coordinating care related to the above assessment and plan.  Lindell Spar, NP [facility] Palliative Medicine Team Team Cell Phone: [phone] Please utilize secure chat with additional questions, if there is no response within 30 minutes please call the above phone number  Palliative Medicine Team providers are available by phone  from 7am to 7pm daily and can be reached through the team cell phone.  Should this patient require assistance outside of these hours, please call the patient's attending physician.

## 2020-04-14 NOTE — Progress Notes (Signed)
[name] Progress Note    Assessment/ Plan:   1.  Progressive CKD stage V now ESRD.  Underlying CKD thought to be secondary to uncontrolled diabetes and hypertensive arteriosclerosis along with nonadherence/noncompliance. -s/p tunneled dialysis catheter on 04/13/2020 with IR -Open to have HD#2 today (slow start protocol).  We will tentatively plan for running him again on dialysis on Monday 7/26 -Avoid nephrotoxic medications including NSAIDs and iodinated intravenous contrast exposure unless the latter is absolutely indicated.  Preferred narcotic agents for pain control are hydromorphone, fentanyl, and methadone. Morphine should not be used. Avoid Baclofen and avoid oral sodium phosphate and magnesium citrate based laxatives / bowel preps. Continue strict Input and Output monitoring. Will monitor the patient closely with you and intervene or adjust therapy as indicated by changes in clinical status/labs  2.  Seizure disorder, metabolic encephalopathy (noncompliant with medications) -Neurology on board, dialysis as above 3.  Hypertensive urgency on admission.  Receiving IV antihypertensives in setting of him being n.p.o. 4.  Secondary hyperparathyroidism -PTH ordered 5.  Anemia of chronic kidney disease -will start ESA (ferritin 1130) start with Aranesp 40 mcg q. weekly (first dose today)  [name], MD [facility]  Subjective:   No acute events.  No events with dialysis yesterday (net UF 17 cc).  Status post tunneled dialysis catheter placement with VIR on 7/23.  More awake today and cannot remember what happened.   Objective:   BP (!) 171/76 (BP Location: Left Arm)   Pulse 90   Temp 98.3 F (36.8 C) (Oral)   Resp 15   Wt 69.1 kg   SpO2 99%   BMI 20.66 kg/m   Intake/Output Summary (Last 24 hours) at 04/14/2020 1301 Last data filed at 04/14/2020 0201 Gross per 24 hour  Intake --  Output 417 ml  Net -417 ml   Weight change:   Physical  Exam: Gen: Ill-appearing HEENT: ngt CVS: S1-S2, no murmurs/rubs/gallops Resp: Clear to auscultation bilaterally,no w/r/r/c, unlabored, bl chest expansion QQP:YPPJ, nt/nd Ext:no edema Neuro: Awake and communicative, following some commands RIJ TDC: c/d/i  Imaging: IR Fluoro Guide CV Line Right  Result Date: 04/14/2020 CLINICAL DATA:  End-stage renal disease, needs access for hemodialysis EXAM: TUNNELED HEMODIALYSIS CATHETER PLACEMENT WITH ULTRASOUND AND FLUOROSCOPIC GUIDANCE TECHNIQUE: The procedure, risks, benefits, and alternatives were explained to the patient. Questions regarding the procedure were encouraged and answered. The patient understands and consents to the procedure. As antibiotic prophylaxis, cefazolin 2 g was ordered pre-procedure and administered intravenously within one hour of incision.Patency of the right IJ vein was confirmed with ultrasound with image documentation. An appropriate skin site was determined. Region was prepped using maximum barrier technique including cap and mask, sterile gown, sterile gloves, large sterile sheet, and Chlorhexidine as cutaneous antisepsis. The region was infiltrated locally with 1% lidocaine. Intravenous versed 1mg  were administered as conscious sedation during continuous monitoring of the patient's level of consciousness and physiological / cardiorespiratory status by the radiology RN, with a total moderate sedation time of 10 minutes. Under real-time ultrasound guidance, the right IJ vein was accessed with a 21 gauge micropuncture needle; the needle tip within the vein was confirmed with ultrasound image documentation. Needle exchanged over the 018 guidewire for transitional dilator, which allowed advancement of a Benson wire into the IVC. Over this, an MPA catheter was advanced. A Palindrome 19 hemodialysis catheter was tunneled from the right anterior chest wall approach to the right IJ dermatotomy site. The MPA catheter was exchanged over an  Amplatz wire for serial vascular dilators which allow placement of a peel-away sheath, through which the catheter was advanced under intermittent fluoroscopy, positioned with its tips in the proximal and midright atrium. Spot chest radiograph confirms good catheter position. No pneumothorax. Catheter was flushed and primed per protocol. Catheter secured externally with O Prolene sutures. The right IJ dermatotomy site was closed with Dermabond. COMPLICATIONS: COMPLICATIONS None immediate COMPARISON:  None IMPRESSION: 1. Technically successful placement of tunneled right IJ hemodialysis catheter with ultrasound and fluoroscopic guidance. Ready for routine use. ACCESS: Remains approachable for percutaneous intervention as needed. Electronically Signed   By: Lucrezia Europe M.D.   On: 04/14/2020 08:15   IR US Guide Vasc Access Right  Result Date: 04/14/2020 CLINICAL DATA:  End-stage renal disease, needs access for hemodialysis EXAM: TUNNELED HEMODIALYSIS CATHETER PLACEMENT WITH ULTRASOUND AND FLUOROSCOPIC GUIDANCE TECHNIQUE: The procedure, risks, benefits, and alternatives were explained to the patient. Questions regarding the procedure were encouraged and answered. The patient understands and consents to the procedure. As antibiotic prophylaxis, cefazolin 2 g was ordered pre-procedure and administered intravenously within one hour of incision.Patency of the right IJ vein was confirmed with ultrasound with image documentation. An appropriate skin site was determined. Region was prepped using maximum barrier technique including cap and mask, sterile gown, sterile gloves, large sterile sheet, and Chlorhexidine as cutaneous antisepsis. The region was infiltrated locally with 1% lidocaine. Intravenous versed 1mg  were administered as conscious sedation during continuous monitoring of the patient's level of consciousness and physiological / cardiorespiratory status by the radiology RN, with a total moderate sedation time of 10  minutes. Under real-time ultrasound guidance, the right IJ vein was accessed with a 21 gauge micropuncture needle; the needle tip within the vein was confirmed with ultrasound image documentation. Needle exchanged over the 018 guidewire for transitional dilator, which allowed advancement of a Benson wire into the IVC. Over this, an MPA catheter was advanced. A Palindrome 19 hemodialysis catheter was tunneled from the right anterior chest wall approach to the right IJ dermatotomy site. The MPA catheter was exchanged over an Amplatz wire for serial vascular dilators which allow placement of a peel-away sheath, through which the catheter was advanced under intermittent fluoroscopy, positioned with its tips in the proximal and midright atrium. Spot chest radiograph confirms good catheter position. No pneumothorax. Catheter was flushed and primed per protocol. Catheter secured externally with O Prolene sutures. The right IJ dermatotomy site was closed with Dermabond. COMPLICATIONS: COMPLICATIONS None immediate COMPARISON:  None IMPRESSION: 1. Technically successful placement of tunneled right IJ hemodialysis catheter with ultrasound and fluoroscopic guidance. Ready for routine use. ACCESS: Remains approachable for percutaneous intervention as needed. Electronically Signed   By: Lucrezia Europe M.D.   On: 04/14/2020 08:15   DG Abd Portable 1V  Result Date: 04/13/2020 CLINICAL DATA:  Feeding tube placement. EXAM: PORTABLE ABDOMEN - 1 VIEW COMPARISON:  None. FINDINGS: A small bore feeding tube is noted with tip overlying the proximal-mid stomach. No dilated bowel loops are identified. IMPRESSION: Small bore feeding tube with tip overlying the proximal-mid stomach. Electronically Signed   By: Margarette Canada M.D.   On: 04/13/2020 15:19   Overnight EEG with video  Result Date: 04/13/2020 Lora Havens, MD     04/14/2020 11:04 AM .Patient Name: Richard [name] MRN: [id number] Epilepsy Attending: Lora Havens  Referring Physician/Provider: Dr Donnetta Simpers Duration: 04/12/2020 1255 to 04/13/2020 1255  Patient history: 64yo M with forced right gaze deviation concerning for seizure. EEG to evaluate  for seizure.  Level of alertness: Awake, asleep  AEDs during EEG study: LEV, vimpat  Technical aspects: This EEG study was done with scalp electrodes positioned according to the 10-20 International system of electrode placement. Electrical activity was acquired at a sampling rate of 500Hz  and reviewed with a high frequency filter of 70Hz  and a low frequency filter of 1Hz . EEG data were recorded continuously and digitally stored.  Description: No posterior dominant rhythm was seen. Sleep was characterized by vertex waves, sleep spindles (12 to 14 Hz), maximal frontocentral region.  EEG showed continuous generalized rhythmic 3 to 6 Hz theta-delta slowing. Hyperventilation and photic stimulation were not performed.    ABNORMALITY -Continuous rhythmic slow, generalized  IMPRESSION: This study is suggestive of moderate diffuse encephalopathy, nonspecific etiology. No seizures or epileptiform discharges were seen throughout the recording.  Priyanka O Yadav   VAS [place] UPPER EXT VEIN MAPPING (PRE-OP AVF)  Result Date: 04/13/2020 UPPER EXTREMITY VEIN MAPPING  Indications: Pre-access. Comparison Study: no priro Performing Technologist: Abram Sander RVS  Examination Guidelines: A complete evaluation includes B-mode imaging, spectral Doppler, color Doppler, and power Doppler as needed of all accessible portions of each vessel. Bilateral testing is considered an integral part of a complete examination. Limited examinations for reoccurring indications may be performed as noted. +-------------+-------------+----------+--------+ Left CephalicDiameter (cm)Depth (cm)Findings +-------------+-------------+----------+--------+  +-----------------+-------------+----------+---------+ Left Cephalic    Diameter (cm)Depth (cm)Findings   +-----------------+-------------+----------+---------+ Shoulder             0.43        0.64             +-----------------+-------------+----------+---------+ Prox upper arm       0.36        0.42             +-----------------+-------------+----------+---------+ Mid upper arm        0.36        0.40             +-----------------+-------------+----------+---------+ Dist upper arm       0.40        0.33   branching +-----------------+-------------+----------+---------+ Antecubital fossa    0.48        0.80             +-----------------+-------------+----------+---------+ Prox forearm         0.20        0.48   branching +-----------------+-------------+----------+---------+ Mid forearm          0.11        0.39             +-----------------+-------------+----------+---------+ Dist forearm         0.14        0.24             +-----------------+-------------+----------+---------+ +-----------------+-------------+----------+--------+ Left Basilic     Diameter (cm)Depth (cm)Findings +-----------------+-------------+----------+--------+ Prox upper arm       0.43        0.77            +-----------------+-------------+----------+--------+ Mid upper arm        0.33        0.79            +-----------------+-------------+----------+--------+ Dist upper arm       0.29        0.72            +-----------------+-------------+----------+--------+ Antecubital fossa    0.34        0.68            +-----------------+-------------+----------+--------+  Prox forearm         0.29        0.19            +-----------------+-------------+----------+--------+ Mid forearm          0.27        0.27            +-----------------+-------------+----------+--------+ Distal forearm       0.28        0.32            +-----------------+-------------+----------+--------+ Wrist                0.16        0.32             +-----------------+-------------+----------+--------+ *See table(s) above for measurements and observations.  Diagnosing physician: Monica Martinez MD Electronically signed by Monica Martinez MD on 04/13/2020 at 4:31:20 PM.    Final     Labs: BMET Recent Labs  Lab 04/10/20 2037 04/11/20 0018 04/11/20 0402 04/12/20 0434 04/13/20 1113 04/13/20 1829 04/14/20 0511 04/14/20 1137  NA 143  --  145 143 148*  --   --  143  K 4.2  --  4.4 4.4 4.5  --   --  3.8  CL 115*  --  117* 116* 119*  --   --  110  CO2 13*  --  11* 13* 12*  --   --  21*  GLUCOSE 106*  --  94 88 68*  --   --  107*  BUN 76*  --  78* 86* 94*  --   --  55*  CREATININE 9.98* 9.99* 9.99* 10.66* 11.51*  --   --  7.74*  CALCIUM 7.5*  --  7.6* 7.5* 8.1*  --   --  8.0*  PHOS  --   --   --   --  7.6* 8.3* 4.4  --    CBC Recent Labs  Lab 04/10/20 2037 04/10/20 2037 04/11/20 0018 04/11/20 0402 04/12/20 0434 04/14/20 1137  WBC 6.6   < > 6.8 9.1 6.5 4.9  NEUTROABS 4.7  --   --   --  5.0 3.6  HGB 10.3*   < > 11.4* 9.7* 8.2* 9.2*  HCT 31.5*   < > 34.5* 29.4* 24.5* 27.5*  MCV 90.0   < > 87.6 89.4 88.1 87.0  PLT 192   < > 201 195 173 180   < > = values in this interval not displayed.    Medications:    . Chlorhexidine Gluconate Cloth  6 each Topical Q0600  . [START ON 04/15/2020] clopidogrel  75 mg Per Tube Daily  . darbepoetin (ARANESP) injection - DIALYSIS  40 mcg Intravenous Q Fri-HD  . feeding supplement (PROSource TF)  45 mL Per Tube Daily  . heparin  5,000 Units Subcutaneous Q8H  . heparin sodium (porcine)      . insulin aspart  0-9 Units Subcutaneous Q4H  . LORazepam  2 mg Intravenous Once  . [START ON 04/15/2020] pantoprazole sodium  40 mg Per Tube Daily  . pravastatin  80 mg Per Tube QHS  . tamsulosin  0.4 mg Oral Daily      [name], MD [facility] 04/14/2020, 1:01 PM

## 2020-04-15 DIAGNOSIS — B20 Human immunodeficiency virus [HIV] disease: Secondary | ICD-10-CM | POA: Diagnosis not present

## 2020-04-15 DIAGNOSIS — I160 Hypertensive urgency: Secondary | ICD-10-CM

## 2020-04-15 DIAGNOSIS — N185 Chronic kidney disease, stage 5: Secondary | ICD-10-CM | POA: Diagnosis not present

## 2020-04-15 DIAGNOSIS — N186 End stage renal disease: Secondary | ICD-10-CM | POA: Diagnosis not present

## 2020-04-15 DIAGNOSIS — G9340 Encephalopathy, unspecified: Secondary | ICD-10-CM | POA: Diagnosis not present

## 2020-04-15 DIAGNOSIS — R7989 Other specified abnormal findings of blood chemistry: Secondary | ICD-10-CM | POA: Insufficient documentation

## 2020-04-15 DIAGNOSIS — E441 Mild protein-calorie malnutrition: Secondary | ICD-10-CM

## 2020-04-15 DIAGNOSIS — I6930 Unspecified sequelae of cerebral infarction: Secondary | ICD-10-CM | POA: Diagnosis not present

## 2020-04-15 DIAGNOSIS — E119 Type 2 diabetes mellitus without complications: Secondary | ICD-10-CM | POA: Diagnosis not present

## 2020-04-15 DIAGNOSIS — Z515 Encounter for palliative care: Secondary | ICD-10-CM

## 2020-04-15 DIAGNOSIS — Z7189 Other specified counseling: Secondary | ICD-10-CM

## 2020-04-15 DIAGNOSIS — R569 Unspecified convulsions: Secondary | ICD-10-CM | POA: Diagnosis not present

## 2020-04-15 DIAGNOSIS — G0490 Encephalitis and encephalomyelitis, unspecified: Secondary | ICD-10-CM | POA: Insufficient documentation

## 2020-04-15 LAB — GLUCOSE, CAPILLARY
Glucose-Capillary: 116 mg/dL — ABNORMAL HIGH (ref 70–99)
Glucose-Capillary: 120 mg/dL — ABNORMAL HIGH (ref 70–99)
Glucose-Capillary: 97 mg/dL (ref 70–99)
Glucose-Capillary: 84 mg/dL (ref 70–99)
Glucose-Capillary: 111 mg/dL — ABNORMAL HIGH (ref 70–99)

## 2020-04-15 LAB — MAGNESIUM: Magnesium: 2.2 mg/dL (ref 1.7–2.4)

## 2020-04-15 LAB — PHOSPHORUS: Phosphorus: 3.4 mg/dL (ref 2.5–4.6)

## 2020-04-15 LAB — HEPATITIS B SURFACE ANTIBODY, QUANTITATIVE: Hep B S AB Quant (Post): <3.1 m[IU]/mL — ABNORMAL LOW (ref >9.9)

## 2020-04-15 LAB — HSV DNA BY PCR (REFERENCE LAB)
HSV 1 DNA: NEGATIVE
HSV 2 DNA: NEGATIVE

## 2020-04-15 MED ORDER — CLOPIDOGREL BISULFATE 75 MG PO TABS
75.0000 mg | ORAL_TABLET | Freq: Every day | ORAL | Status: DC
  Administered 2020-04-15 – 2020-04-17 (×3): 75 mg
  Filled 2020-04-15 (×4): qty 1

## 2020-04-15 MED ORDER — SULFAMETHOXAZOLE-TRIMETHOPRIM 800-160 MG PO TABS
1.0000 | ORAL_TABLET | ORAL | Status: DC
  Administered 2020-04-16 – 2020-05-07 (×6): 1 via ORAL
  Filled 2020-04-15 (×8): qty 1

## 2020-04-15 MED ORDER — LEVETIRACETAM 500 MG PO TABS
500.0000 mg | ORAL_TABLET | Freq: Two times a day (BID) | ORAL | Status: DC
  Administered 2020-04-17 – 2020-05-07 (×35): 500 mg via ORAL
  Filled 2020-04-15 (×41): qty 1

## 2020-04-15 MED ORDER — DOLUTEGRAVIR SODIUM 50 MG PO TABS
50.0000 mg | ORAL_TABLET | Freq: Every day | ORAL | Status: DC
  Administered 2020-04-15 – 2020-04-29 (×12): 50 mg via ORAL
  Filled 2020-04-15 (×16): qty 1

## 2020-04-15 MED ORDER — LAMIVUDINE 150 MG PO TABS
300.0000 mg | ORAL_TABLET | Freq: Every day | ORAL | Status: DC
  Administered 2020-04-15: 300 mg via ORAL
  Filled 2020-04-15: qty 2

## 2020-04-15 MED ORDER — LAMIVUDINE 150 MG PO TABS
300.0000 mg | ORAL_TABLET | Freq: Every day | ORAL | Status: DC
  Administered 2020-04-16 – 2020-04-17 (×2): 300 mg
  Filled 2020-04-15 (×3): qty 2

## 2020-04-15 MED ORDER — NEPRO/CARBSTEADY PO LIQD
1000.0000 mL | ORAL | Status: DC
  Administered 2020-04-16 – 2020-04-18 (×3): 1000 mL
  Filled 2020-04-15 (×4): qty 1000

## 2020-04-15 MED ORDER — CARVEDILOL 3.125 MG PO TABS
3.1250 mg | ORAL_TABLET | Freq: Two times a day (BID) | ORAL | Status: DC
  Administered 2020-04-15 – 2020-04-17 (×5): 3.125 mg
  Filled 2020-04-15 (×6): qty 1

## 2020-04-15 MED ORDER — HEPARIN SODIUM (PORCINE) 5000 UNIT/ML IJ SOLN
5000.0000 [IU] | Freq: Three times a day (TID) | INTRAMUSCULAR | Status: DC
  Administered 2020-04-15 – 2020-05-07 (×62): 5000 [IU] via SUBCUTANEOUS
  Filled 2020-04-15 (×64): qty 1

## 2020-04-15 MED ORDER — LEVETIRACETAM IN NACL 500 MG/100ML IV SOLN
500.0000 mg | Freq: Two times a day (BID) | INTRAVENOUS | Status: DC
  Administered 2020-04-15 – 2020-04-19 (×5): 500 mg via INTRAVENOUS
  Filled 2020-04-15 (×6): qty 100

## 2020-04-15 NOTE — Progress Notes (Signed)
PT Cancellation Note  Patient Details Name: Richard [name] MRN: [id number] DOB: 1956/03/13   Cancelled Treatment:    Reason Eval/Treat Not Completed: Fatigue/lethargy limiting ability to participate  Patient not arousing to name or stimulation. RN reports he's been very lethargic today. Noted pt with episode of incontinence of bowels and NT made aware.   Arby Barrette, PT Pager [phone]   Rexanne Mano 04/15/2020, 3:36 PM

## 2020-04-15 NOTE — Progress Notes (Signed)
Pharmacy note:  HIV medications  Contacted by Dr. Tommy Medal to assist with Dolutegravir / tube feed interaction. Absorption of dolutegravir can be inhibited by tube feeds.  I rescheduled the tube feeds to be held from 8am to 12pm, 2 hours before and 2 hours after dolutegravir administration.  I increased the tube feed rate to 31mL/hr to provide a very similar volume of feeds in a 24 hour period to compensate for the 4 hours of being off tube feeds.  Richard [name], PharmD, BCPS-AQ ID Clinical Pharmacist Phone [phone]

## 2020-04-15 NOTE — Progress Notes (Signed)
Subjective:  He is not interacting at all with me today  Antibiotics:  Anti-infectives (From admission, onward)   Start     Dose/Rate Route Frequency Ordered Stop   04/16/20 1000  lamiVUDine (EPIVIR) tablet 300 mg     Discontinue    Note to Pharmacy: I DO NOT renally dose lamivudine and I have never had problems NOT adjusting it. I want pt to be on Dovato but still not on formulary here (unfortunately)   300 mg Per Tube Daily 04/15/20 1307     04/15/20 1245  dolutegravir (TIVICAY) tablet 50 mg     Discontinue     50 mg Oral Daily 04/15/20 1202     04/15/20 1245  lamiVUDine (EPIVIR) tablet 300 mg  Status:  Discontinued       Note to Pharmacy: I DO NOT renally dose lamivudine and I have never had problems NOT adjusting it. I want pt to be on Dovato but still not on formulary here (unfortunately)   300 mg Oral Daily 04/15/20 1202 04/15/20 1307   04/13/20 1737  ceFAZolin (ANCEF) 2-4 GM/100ML-% IVPB       Note to Pharmacy: Desiree Hane   : cabinet override      04/13/20 1737 04/14/20 0544   04/13/20 0000  ceFAZolin (ANCEF) IVPB 2g/100 mL premix        2 g 200 mL/hr over 30 Minutes Intravenous To Radiology 04/12/20 1454 04/13/20 0834      Medications: Scheduled Meds: . carvedilol  3.125 mg Per Tube BID WC  . Chlorhexidine Gluconate Cloth  6 each Topical Q0600  . clopidogrel  75 mg Per Tube Daily  . darbepoetin (ARANESP) injection - DIALYSIS  40 mcg Intravenous Q Fri-HD  . dolutegravir  50 mg Oral Daily  . feeding supplement (PROSource TF)  45 mL Per Tube Daily  . heparin injection (subcutaneous)  5,000 Units Subcutaneous Q8H  . insulin aspart  0-9 Units Subcutaneous Q4H  . [START ON 04/16/2020] lamiVUDine  300 mg Per Tube Daily  . levETIRAcetam  500 mg Oral Q12H  . LORazepam  2 mg Intravenous Once  . pantoprazole sodium  40 mg Per Tube Daily  . pravastatin  80 mg Per Tube QHS  . tamsulosin  0.4 mg Oral Daily   Continuous Infusions: . feeding supplement (NEPRO CARB  STEADY) 1,000 mL (04/14/20 1315)  . levETIRAcetam     PRN Meds:.hydrALAZINE, labetalol    Objective: Weight change: -1.5 kg  Intake/Output Summary (Last 24 hours) at 04/15/2020 1345 Last data filed at 04/15/2020 0300 Gross per 24 hour  Intake 390.48 ml  Output 900 ml  Net -509.52 ml   Blood pressure (!) 123/62, pulse 88, temperature 98.9 F (37.2 C), temperature source Oral, resp. rate 17, weight 68.4 kg, SpO2 94 %. Temp:  [97.7 F (36.5 C)-98.9 F (37.2 C)] 98.9 F (37.2 C) (07/25 1113) Pulse Rate:  [83-102] 88 (07/25 1234) Resp:  [14-24] 17 (07/25 1234) BP: (123-179)/(62-96) 123/62 (07/25 1234) SpO2:  [94 %-100 %] 94 % (07/25 1234) Weight:  [67.6 kg-68.4 kg] 68.4 kg (07/25 0500)  Physical Exam: General: Obtunded today. HEENT: anicteric sclera, EOMI CVS tachycardic no murmurs gallops rubs l  Chest: , no wheezing, no respiratory distress lungs clear Abdomen: soft non-distended,  Extremities: Right upper extremity with contracture Skin: Diffuse rash that is concerning for possible disseminated cryptococcal infection Neuro: Right upper extremity with contracture  CBC:    BMET Recent Labs  04/13/20 1113 04/14/20 1137  NA 148* 143  K 4.5 3.8  CL 119* 110  CO2 12* 21*  GLUCOSE 68* 107*  BUN 94* 55*  CREATININE 11.51* 7.74*  CALCIUM 8.1* 8.0*     Liver Panel  Recent Labs    04/13/20 1113 04/14/20 1137  PROT  --  6.1*  ALBUMIN 2.3* 2.3*  AST  --  19  ALT  --  8  ALKPHOS  --  79  BILITOT  --  0.4       Sedimentation Rate No results for input(s): ESRSEDRATE in the last 72 hours. C-Reactive Protein No results for input(s): CRP in the last 72 hours.  Micro Results: Recent Results (from the past 720 hour(s))  SARS Coronavirus 2 by RT PCR (hospital order, performed in [facility] hospital lab) Nasopharyngeal Nasopharyngeal Swab     Status: None   Collection Time: 04/11/20  3:58 AM   Specimen: Nasopharyngeal Swab  Result Value Ref Range Status    SARS Coronavirus 2 NEGATIVE NEGATIVE Final    Comment: (NOTE) SARS-CoV-2 target nucleic acids are NOT DETECTED.  The SARS-CoV-2 RNA is generally detectable in upper and lower respiratory specimens during the acute phase of infection. The lowest concentration of SARS-CoV-2 viral copies this assay can detect is 250 copies / mL. A negative result does not preclude SARS-CoV-2 infection and should not be used as the sole basis for treatment or other patient management decisions.  A negative result may occur with improper specimen collection / handling, submission of specimen other than nasopharyngeal swab, presence of viral mutation(s) within the areas targeted by this assay, and inadequate number of viral copies (<250 copies / mL). A negative result must be combined with clinical observations, patient history, and epidemiological information.  Fact Sheet for Patients:   StrictlyIdeas.no  Fact Sheet for Healthcare Providers: BankingDealers.co.za  This test is not yet approved or  cleared by the [place] FDA and has been authorized for detection and/or diagnosis of SARS-CoV-2 by FDA under an Emergency Use Authorization (EUA).  This EUA will remain in effect (meaning this test can be used) for the duration of the COVID-19 declaration under Section 564(b)(1) of the Act, 21 U.S.C. section 360bbb-3(b)(1), unless the authorization is terminated or revoked sooner.  Performed at [facility], [place] [address] [place], [place] [address]   Culture, blood (routine x 2)     Status: None (Preliminary result)   Collection Time: 04/14/20 11:31 AM   Specimen: BLOOD RIGHT HAND  Result Value Ref Range Status   Specimen Description BLOOD RIGHT HAND  Final   Special Requests   Final    BOTTLES DRAWN AEROBIC ONLY Blood Culture results may not be optimal due to an inadequate volume of blood received in culture bottles   Culture   Final    NO  GROWTH < 24 HOURS Performed at [facility], [place] [address], [place], [place] [address]    Report Status PENDING  Incomplete  Culture, blood (routine x 2)     Status: None (Preliminary result)   Collection Time: 04/14/20 11:36 AM   Specimen: BLOOD RIGHT HAND  Result Value Ref Range Status   Specimen Description BLOOD RIGHT HAND  Final   Special Requests   Final    BOTTLES DRAWN AEROBIC ONLY Blood Culture results may not be optimal due to an inadequate volume of blood received in culture bottles   Culture   Final    NO GROWTH <  24 HOURS Performed at [facility], [place] [address], [place], [place] [address]    Report Status PENDING  Incomplete  CSF culture     Status: None (Preliminary result)   Collection Time: 04/14/20  6:27 PM   Specimen: PATH Cytology CSF; Cerebrospinal Fluid  Result Value Ref Range Status   Specimen Description CSF  Final   Special Requests NONE  Final   Gram Stain NO WBC SEEN NO ORGANISMS SEEN CYTOSPIN SMEAR   Final   Culture   Final    NO GROWTH < 24 HOURS Performed at [facility], [address] [place], [place] [address]    Report Status PENDING  Incomplete    Studies/Results: IR Fluoro Guide CV Line Right  Result Date: 04/14/2020 CLINICAL DATA:  End-stage renal disease, needs access for hemodialysis EXAM: TUNNELED HEMODIALYSIS CATHETER PLACEMENT WITH ULTRASOUND AND FLUOROSCOPIC GUIDANCE TECHNIQUE: The procedure, risks, benefits, and alternatives were explained to the patient. Questions regarding the procedure were encouraged and answered. The patient understands and consents to the procedure. As antibiotic prophylaxis, cefazolin 2 g was ordered pre-procedure and administered intravenously within one hour of incision.Patency of the right IJ vein was confirmed with ultrasound with image documentation. An appropriate skin site was determined. Region was prepped using maximum barrier technique including cap and mask, sterile gown, sterile  gloves, large sterile sheet, and Chlorhexidine as cutaneous antisepsis. The region was infiltrated locally with 1% lidocaine. Intravenous versed 1mg  were administered as conscious sedation during continuous monitoring of the patient's level of consciousness and physiological / cardiorespiratory status by the radiology RN, with a total moderate sedation time of 10 minutes. Under real-time ultrasound guidance, the right IJ vein was accessed with a 21 gauge micropuncture needle; the needle tip within the vein was confirmed with ultrasound image documentation. Needle exchanged over the 018 guidewire for transitional dilator, which allowed advancement of a Benson wire into the IVC. Over this, an MPA catheter was advanced. A Palindrome 19 hemodialysis catheter was tunneled from the right anterior chest wall approach to the right IJ dermatotomy site. The MPA catheter was exchanged over an Amplatz wire for serial vascular dilators which allow placement of a peel-away sheath, through which the catheter was advanced under intermittent fluoroscopy, positioned with its tips in the proximal and midright atrium. Spot chest radiograph confirms good catheter position. No pneumothorax. Catheter was flushed and primed per protocol. Catheter secured externally with O Prolene sutures. The right IJ dermatotomy site was closed with Dermabond. COMPLICATIONS: COMPLICATIONS None immediate COMPARISON:  None IMPRESSION: 1. Technically successful placement of tunneled right IJ hemodialysis catheter with ultrasound and fluoroscopic guidance. Ready for routine use. ACCESS: Remains approachable for percutaneous intervention as needed. Electronically Signed   By: Lucrezia Europe M.D.   On: 04/14/2020 08:15   IR US Guide Vasc Access Right  Result Date: 04/14/2020 CLINICAL DATA:  End-stage renal disease, needs access for hemodialysis EXAM: TUNNELED HEMODIALYSIS CATHETER PLACEMENT WITH ULTRASOUND AND FLUOROSCOPIC GUIDANCE TECHNIQUE: The procedure,  risks, benefits, and alternatives were explained to the patient. Questions regarding the procedure were encouraged and answered. The patient understands and consents to the procedure. As antibiotic prophylaxis, cefazolin 2 g was ordered pre-procedure and administered intravenously within one hour of incision.Patency of the right IJ vein was confirmed with ultrasound with image documentation. An appropriate skin site was determined. Region was prepped using maximum barrier technique including cap and mask, sterile gown, sterile gloves, large sterile sheet, and Chlorhexidine as cutaneous antisepsis. The region was  infiltrated locally with 1% lidocaine. Intravenous versed 1mg  were administered as conscious sedation during continuous monitoring of the patient's level of consciousness and physiological / cardiorespiratory status by the radiology RN, with a total moderate sedation time of 10 minutes. Under real-time ultrasound guidance, the right IJ vein was accessed with a 21 gauge micropuncture needle; the needle tip within the vein was confirmed with ultrasound image documentation. Needle exchanged over the 018 guidewire for transitional dilator, which allowed advancement of a Benson wire into the IVC. Over this, an MPA catheter was advanced. A Palindrome 19 hemodialysis catheter was tunneled from the right anterior chest wall approach to the right IJ dermatotomy site. The MPA catheter was exchanged over an Amplatz wire for serial vascular dilators which allow placement of a peel-away sheath, through which the catheter was advanced under intermittent fluoroscopy, positioned with its tips in the proximal and midright atrium. Spot chest radiograph confirms good catheter position. No pneumothorax. Catheter was flushed and primed per protocol. Catheter secured externally with O Prolene sutures. The right IJ dermatotomy site was closed with Dermabond. COMPLICATIONS: COMPLICATIONS None immediate COMPARISON:  None IMPRESSION:  1. Technically successful placement of tunneled right IJ hemodialysis catheter with ultrasound and fluoroscopic guidance. Ready for routine use. ACCESS: Remains approachable for percutaneous intervention as needed. Electronically Signed   By: Lucrezia Europe M.D.   On: 04/14/2020 08:15   DG Abd Portable 1V  Result Date: 04/13/2020 CLINICAL DATA:  Feeding tube placement. EXAM: PORTABLE ABDOMEN - 1 VIEW COMPARISON:  None. FINDINGS: A small bore feeding tube is noted with tip overlying the proximal-mid stomach. No dilated bowel loops are identified. IMPRESSION: Small bore feeding tube with tip overlying the proximal-mid stomach. Electronically Signed   By: Margarette Canada M.D.   On: 04/13/2020 15:19   VAS [place] UPPER EXT VEIN MAPPING (PRE-OP AVF)  Result Date: 04/13/2020 UPPER EXTREMITY VEIN MAPPING  Indications: Pre-access. Comparison Study: no priro Performing Technologist: Abram Sander RVS  Examination Guidelines: A complete evaluation includes B-mode imaging, spectral Doppler, color Doppler, and power Doppler as needed of all accessible portions of each vessel. Bilateral testing is considered an integral part of a complete examination. Limited examinations for reoccurring indications may be performed as noted. +-------------+-------------+----------+--------+ Left CephalicDiameter (cm)Depth (cm)Findings +-------------+-------------+----------+--------+  +-----------------+-------------+----------+---------+ Left Cephalic    Diameter (cm)Depth (cm)Findings  +-----------------+-------------+----------+---------+ Shoulder             0.43        0.64             +-----------------+-------------+----------+---------+ Prox upper arm       0.36        0.42             +-----------------+-------------+----------+---------+ Mid upper arm        0.36        0.40             +-----------------+-------------+----------+---------+ Dist upper arm       0.40        0.33   branching  +-----------------+-------------+----------+---------+ Antecubital fossa    0.48        0.80             +-----------------+-------------+----------+---------+ Prox forearm         0.20        0.48   branching +-----------------+-------------+----------+---------+ Mid forearm          0.11        0.39             +-----------------+-------------+----------+---------+  Dist forearm         0.14        0.24             +-----------------+-------------+----------+---------+ +-----------------+-------------+----------+--------+ Left Basilic     Diameter (cm)Depth (cm)Findings +-----------------+-------------+----------+--------+ Prox upper arm       0.43        0.77            +-----------------+-------------+----------+--------+ Mid upper arm        0.33        0.79            +-----------------+-------------+----------+--------+ Dist upper arm       0.29        0.72            +-----------------+-------------+----------+--------+ Antecubital fossa    0.34        0.68            +-----------------+-------------+----------+--------+ Prox forearm         0.29        0.19            +-----------------+-------------+----------+--------+ Mid forearm          0.27        0.27            +-----------------+-------------+----------+--------+ Distal forearm       0.28        0.32            +-----------------+-------------+----------+--------+ Wrist                0.16        0.32            +-----------------+-------------+----------+--------+ *See table(s) above for measurements and observations.  Diagnosing physician: Monica Martinez MD Electronically signed by Monica Martinez MD on 04/13/2020 at 4:31:20 PM.    Final    DG FLUORO GUIDE LUMBAR PUNCTURE  Result Date: 04/14/2020 CLINICAL DATA:  64 year old male with history of altered mental status. 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 History of HIV. EXAM: DIAGNOSTIC LUMBAR PUNCTURE UNDER FLUOROSCOPIC GUIDANCE  FLUOROSCOPY TIME:  Fluoroscopy Time:  1.2 minutes Radiation Exposure Index (if provided by the fluoroscopic device): 7.8 mGy PROCEDURE: Informed consent was obtained from the patient prior to the procedure, including potential complications of headache, allergy, and pain. With the patient prone, the lower back was prepped with Betadine. 1% Lidocaine was used for local anesthesia. Lumbar puncture was performed at the L2-L3 level using a 22 gauge needle with return of clear CSF with an opening pressure of 18 cm water. 32 ml of CSF were obtained for laboratory studies. Closing pressure was not measurable. The patient tolerated the procedure well and there were no apparent complications. IMPRESSION: 1. Successful uncomplicated fluoroscopic guided lumbar puncture, as above. Electronically Signed   By: Vinnie Langton M.D.   On: 04/14/2020 18:46      Assessment/Plan:  INTERVAL HISTORY:   Lumbar puncture by interventional radiology had a normal opening pressure normal CSF profile and negative cryptococcal antigen  Principal Problem:   Encephalopathy Active Problems:   Tobacco abuse   Protein-calorie malnutrition, mild (HCC)   Hyperlipidemia   Neuropathy in diabetes (HCC)   GERD (gastroesophageal reflux disease)   Diabetes mellitus, type II, insulin dependent (HCC)   CKD (chronic kidney disease) stage 5, GFR less than 15 ml/min (HCC)   History of cerebrovascular accident (CVA) with residual deficit   Seizure ([place])   AIDS (acquired immune deficiency syndrome) ([place])   ESRD (end stage renal disease) ([place])  Viaan Knippenberg is a 64 y.o. male with past medical history significant for poorly controlled diabetes and hypertension with progression of his chronic kidney disease, prior stroke who was admitted with seizures and encephalopathy and found to have a parietal infarct.  He also has been found to have HIV and AIDS with a CD4 count of 39.  He has a diffuse rash that is concerning for  possible disseminated cryptococcal infection versus histoplasma Blastomyces  #1  Central nervous system infarct with encephalopathy: I was concerned that he could have disseminated cryptococcal infection cryptococcal meningitis.  Fortunately had a normal opening pressure and a negative cryptococcal antigen in CSF.  His CSF profile is also normal although it can be normal in people with HIV AIDS and cryptococcal meningitis.  He does not appear to have any evidence of CNS infection which is a relief  Greatly appreciate interventional radiology to providing LP and neurology attempting 1 However he clearly needs a lumbar puncture and neurology has graciously agreed to attempt this.   2.  HIV and AIDS: Now that we know he does not have a CNS opportunistic infection I will initiate antiretroviral therapy  I had wanted to use Dovato as a single tablet regimen for him (note I do not renally adjust lamivudine and I found that there are no no problems in terms of tolerability or toxicity in dialysis patients to receive full dose lamivudine.  )  We do not have Dovato on formulary yet so will be given in its component parts.  Note his tube feeds will need to be held before and after getting his antivirals as the calcium and magnesium can chelate the TIVICAY  We will start Bactrim 3 times weekly  3.  Chronic kidney disease that is progressed to end-stage renal disease.  He is to go undergo dialysis   #4 goals of care: Currently receiving aggressive care.  Certainly his HIV is easily treatable and the least difficult to treat of his problems.  I did have a extended conversation with his daughter with regards to the nature of HIV and how we treated and how would be critical that the patient continue to take his medications religiously.  I described the current state of his HIV which is 1 in which his immune system is highly compromised.  However he can certainly reconstitute it once to get the virus  controlled.  Neurology were also able to get a hold the patient's daughter      LOS: 5 days   Alcide Evener 04/15/2020, 1:45 PM

## 2020-04-15 NOTE — Progress Notes (Signed)
Brief Neuro Update:  Reviewed preliminary CSF studies with no pleocytosis, mild protein elevation likely due to his recent seizures. CSF Gram stain negative. Cryptococcus PCR negative. VDRL CSF is pending.  I am going to discontinue Vimpat, continue Keppra at 500mg  BID. Daughter endorses that he is non compliant with his Keppra. Seizures were likely provoked due to non compliance with keppra and ESRD, refusing HD.  [place] Pager Number [phone]

## 2020-04-15 NOTE — Progress Notes (Signed)
Daily Progress Note   Patient Name: Richard [name]       Date: 04/15/2020 DOB: 1956/03/25  Age: 64 y.o. MRN#: [id number] Attending Physician: Florencia Reasons, MD Primary Care Physician: [name], MD Admit Date: 04/10/2020  Reason for Consultation/Follow-up: Establishing goals of care  Subjective: Patient lethargic. Does not wake to voice. Does not follow commands. No s/s of pain or discomfort.   GOC: Family arrived to bedside this afternoon, patient's daughter Richard [name]) and patient's [name]) with his wife. Explored family support. Richard [name] is patient's only biological child. Richard [name] and Richard [name] share that they have not seen Richard [name] in many years. They do not have any information about his health prior to this hospitalization. 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 Richard [name] has been caring for [place] for months. Family at bedside defers decisions to [place]. I did provide update on his condition besides HIV/AIDS diagnoses. Family does not stay long.   Shortly after, spoke with Richard [name] via telephone. 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 Richard [name] gives permission for family members to receive updates.   Discussed diagnoses, interventions, plan of care. Richard [name] has spoken with many doctors. She is aware of her father's HIV/AIDS diagnosis, and that this is treatable but only if he will stay compliant with medications.   Richard [name] has been taking care of Richard [name] for at least 6 months. He is very stubborn and a private person, has broken many bridges with family members. He has been non-compliant with medications and  declined starting hemodialysis prior to admission.   Richard [name] wishes to see her father live and get better, but also speaks to his noncompliance and this impacting his ability to recover and live. She has told him "either you gonna do what we want you to do (to live) or let you die." Mycal told Richard [name] this admission he would do what the doctors recommend and agreed with starting dialysis. Richard [name] is unsure if he will stay compliant with medications and knows he cannot discharge home following this hospitalization.   Attempted to elicit values and goals of care important to patient and daughter. Richard [name] states "whatever you need to do to help him." We discussed code  status. Yesterday, patient shared with PMT provider he would wish for CPR to be attempted. Richard [name] does make a comment that she would follow the recommendations of providers but hopeful her father will become more awake and alert to make decisions for himself. Explained that he will likely go back to dialysis tomorrow.   Richard [name] is very overwhelmed with decision making and wants sister, Richard [name] to help but also acknowledges that Richard [name] has not been in his life for many years and will struggle making decisions too. Reassured of ongoing support and conversations with PMT this admission.    Length of Stay: 5   Current Medications: Scheduled Meds:  . carvedilol  3.125 mg Per Tube BID WC  . Chlorhexidine Gluconate Cloth  6 each Topical Q0600  . clopidogrel  75 mg Per Tube Daily  . darbepoetin (ARANESP) injection - DIALYSIS  40 mcg Intravenous Q Fri-HD  . dolutegravir  50 mg Oral Daily  . feeding supplement (PROSource TF)  45 mL Per Tube Daily  . heparin injection (subcutaneous)  5,000 Units Subcutaneous Q8H  . insulin aspart  0-9 Units Subcutaneous Q4H  . [START ON 04/16/2020] lamiVUDine  300 mg Per Tube Daily  . levETIRAcetam  500 mg Oral Q12H  . LORazepam  2 mg Intravenous Once  . pantoprazole sodium  40 mg Per Tube Daily   . pravastatin  80 mg Per Tube QHS  . tamsulosin  0.4 mg Oral Daily    Continuous Infusions: . feeding supplement (NEPRO CARB STEADY) 1,000 mL (04/14/20 1315)  . levETIRAcetam      PRN Meds: hydrALAZINE, labetalol  Physical Exam Vitals and nursing note reviewed.  Constitutional:      Appearance: He is ill-appearing.  HENT:     Head: Normocephalic and atraumatic.  Cardiovascular:     Rate and Rhythm: Normal rate.  Pulmonary:     Effort: No tachypnea, accessory muscle usage or respiratory distress.  Abdominal:     Comments: Cortrak/tube feeds  Skin:    General: Skin is warm and dry.  Neurological:     Mental Status: He is lethargic.            Vital Signs: BP (!) 123/62 (BP Location: Left Arm)   Pulse 88   Temp 98.9 F (37.2 C) (Oral)   Resp 17   Wt 68.4 kg   SpO2 94%   BMI 20.45 kg/m  SpO2: SpO2: 94 % O2 Device: O2 Device: Room Air O2 Flow Rate: O2 Flow Rate (L/min): 2 L/min  Intake/output summary:   Intake/Output Summary (Last 24 hours) at 04/15/2020 1313 Last data filed at 04/15/2020 0300 Gross per 24 hour  Intake 390.48 ml  Output 900 ml  Net -509.52 ml   LBM: Last BM Date: 04/14/20 Baseline Weight: Weight: 69.1 kg Most recent weight: Weight: 68.4 kg       Palliative Assessment/Data: PPS 30%      Patient Active Problem List   Diagnosis Date Noted  . ESRD (end stage renal disease) ([place])   . AIDS (acquired immune deficiency syndrome) ([place]) 04/13/2020  . Encephalopathy 04/13/2020  . Seizure ([place]) 04/10/2020  . History of cerebrovascular accident (CVA) with residual deficit 03/19/2020  . CKD (chronic kidney disease) stage 5, GFR less than 15 ml/min ([facility]) 02/15/2020  . Neuropathy in diabetes ([place])   . Migraines   . Hypertension   . GERD (gastroesophageal reflux disease)   . Diabetes mellitus, type II, insulin dependent ([place])   . Arthritis   . Hyperlipidemia  08/25/2012  . Chronic kidney disease, stage 4 (severe) ([place]) 08/23/2012  . Tobacco  abuse 08/23/2012  . Protein-calorie malnutrition, mild ([place]) 08/23/2012  . 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 He was started back on Keppra and hypertension was treated with labetalol and hydralazine. MRI of brain showed 2 mm infarct in left parietal lobe. A tunneled dialysis catheter was placed and  Dialysis was started on 7/23 with imporvement. Richard diagnosis this admission of HIV/AIDS with viral load 624,000 and CD4 count 39. Diffuse rash present concerning for possible disseminated cryptococcal infection. He is awaiting LP by IR.  Assessment: Acute metabolic encephalopathy Seizure Acute stroke with hx of stroke CKD stage V, now ESRD on dialysis Uremia Richard diagnosis HIV/AIDS Insulin-dependent DM2 Hx of noncompliance  Recommendations/Plan:  Continue FULL code/FULL scope treatment  Ongoing palliative discussions pending clinical course.   Patient has one biological daughter Richard [name]) but she has been estranged for many years.  Patient raised Richard [name], who has been caring for him for many months.Richard [name] has called Richard [name] and other family members urging them to help her make decisions and care for Richard [name]. Richard [name] at bedside this afternoon. She defers decisions to [place].  PMT provider hopeful that Richard [name] and Richard [name] can make decisions together. Richard [name] is overwhelmed and needs ongoing support and updates.   Goals of Care and Additional Recommendations:  Limitations on Scope of  Treatment: Full Scope Treatment  Code Status: FULL   Code Status Orders  (From admission, onward)         Start     Ordered   04/10/20 2332  Full code  Continuous        04/10/20 2331        Code Status History    Date Active Date Inactive Code Status Order ID Comments User Context   02/13/2020 2345 02/16/2020 2347 Full Code [id number]  Elwyn Reach, MD Inpatient   11/29/2014 2333 11/30/2014 1920 Full Code [id number]  Allyne Gee, MD Inpatient   Advance Care Planning Activity       Prognosis:   Unable to determine  Discharge Planning:  To Be Determined  Care plan was discussed with Dr. Erlinda Hong, RN, family (Daughters Richard [name] and Richard [name], [name] and SIL)  Thank you for allowing the Palliative Medicine Team to assist in the care of this patient.   Total Time 75 Prolonged Time Billed yes      Greater than 50%  of this time was spent counseling and coordinating care related to the above assessment and plan.  Ihor Dow, DNP, FNP-C Palliative Medicine Team  Phone: [phone] Fax: [phone]  Please contact Palliative Medicine Team phone at [phone] for questions and concerns.

## 2020-04-15 NOTE — Progress Notes (Signed)
[facility] Progress Note    Assessment/ Plan:   1.  Progressive CKD stage V now ESRD.  Underlying CKD thought to be secondary to uncontrolled diabetes and hypertensive arteriosclerosis along with nonadherence/noncompliance (primary nephrologist=Dr Carolin Sicks however noncompliant with follow up and dialysis planning). -s/p tunneled dialysis catheter on 04/13/2020 with IR -Indication for renal replacement therapy for today.  We will plan for throat intermittent hemodialysis treatment tomorrow and then will tentatively plan for a Monday Wednesday Friday schedule while in-house -dispo planning -Avoid nephrotoxic medications including NSAIDs and iodinated intravenous contrast exposure unless the latter is absolutely indicated.  Preferred narcotic agents for pain control are hydromorphone, fentanyl, and methadone. Morphine should not be used. Avoid Baclofen and avoid oral sodium phosphate and magnesium citrate based laxatives / bowel preps. Continue strict Input and Output monitoring. Will monitor the patient closely with you and intervene or adjust therapy as indicated by changes in clinical status/labs  2.  Seizure disorder, metabolic encephalopathy (noncompliant with medications) -Neurology on board, dialysis as above 3.  Hypertensive urgency on admission.  Receiving IV antihypertensives in setting of him being n.p.o. 4.  Secondary hyperparathyroidism -PTH 160 5.  Anemia of chronic kidney disease -will start ESA (ferritin 1130) start with Aranesp 40 mcg q. weekly (first dose 7/24)  [name], MD [facility]  Subjective:   Not interactive today, drowsy. HD yesterday without events, UF'ed 500cc   Objective:   BP (!) 123/62 (BP Location: Left Arm)   Pulse 88   Temp 98.9 F (37.2 C) (Oral)   Resp 17   Wt 68.4 kg   SpO2 94%   BMI 20.45 kg/m   Intake/Output Summary (Last 24 hours) at 04/15/2020 1401 Last data filed at 04/15/2020 0300 Gross per 24 hour   Intake 390.48 ml  Output 900 ml  Net -509.52 ml   Weight change: -1.5 kg  Physical Exam: Gen: Ill-appearing, drowsy, sleeping in bed HEENT: ngt CVS: S1-S2, no murmurs/rubs/gallops Resp: Clear to auscultation bilaterally,no w/r/r/c, unlabored, bl chest expansion WIO:XBDZ, nt/nd Ext:no edema Neuro: drowsy, follows minimal commands RIJ TDC: c/d/i  Imaging: IR Fluoro Guide CV Line Right  Result Date: 04/14/2020 CLINICAL DATA:  End-stage renal disease, needs access for hemodialysis EXAM: TUNNELED HEMODIALYSIS CATHETER PLACEMENT WITH ULTRASOUND AND FLUOROSCOPIC GUIDANCE TECHNIQUE: The procedure, risks, benefits, and alternatives were explained to the patient. Questions regarding the procedure were encouraged and answered. The patient understands and consents to the procedure. As antibiotic prophylaxis, cefazolin 2 g was ordered pre-procedure and administered intravenously within one hour of incision.Patency of the right IJ vein was confirmed with ultrasound with image documentation. An appropriate skin site was determined. Region was prepped using maximum barrier technique including cap and mask, sterile gown, sterile gloves, large sterile sheet, and Chlorhexidine as cutaneous antisepsis. The region was infiltrated locally with 1% lidocaine. Intravenous versed 1mg  were administered as conscious sedation during continuous monitoring of the patient's level of consciousness and physiological / cardiorespiratory status by the radiology RN, with a total moderate sedation time of 10 minutes. Under real-time ultrasound guidance, the right IJ vein was accessed with a 21 gauge micropuncture needle; the needle tip within the vein was confirmed with ultrasound image documentation. Needle exchanged over the 018 guidewire for transitional dilator, which allowed advancement of a Benson wire into the IVC. Over this, an MPA catheter was advanced. A Palindrome 19 hemodialysis catheter was tunneled from the right  anterior chest wall approach to the right IJ dermatotomy site. The MPA catheter was exchanged  over an Amplatz wire for serial vascular dilators which allow placement of a peel-away sheath, through which the catheter was advanced under intermittent fluoroscopy, positioned with its tips in the proximal and midright atrium. Spot chest radiograph confirms good catheter position. No pneumothorax. Catheter was flushed and primed per protocol. Catheter secured externally with O Prolene sutures. The right IJ dermatotomy site was closed with Dermabond. COMPLICATIONS: COMPLICATIONS None immediate COMPARISON:  None IMPRESSION: 1. Technically successful placement of tunneled right IJ hemodialysis catheter with ultrasound and fluoroscopic guidance. Ready for routine use. ACCESS: Remains approachable for percutaneous intervention as needed. Electronically Signed   By: Lucrezia Europe M.D.   On: 04/14/2020 08:15   IR US Guide Vasc Access Right  Result Date: 04/14/2020 CLINICAL DATA:  End-stage renal disease, needs access for hemodialysis EXAM: TUNNELED HEMODIALYSIS CATHETER PLACEMENT WITH ULTRASOUND AND FLUOROSCOPIC GUIDANCE TECHNIQUE: The procedure, risks, benefits, and alternatives were explained to the patient. Questions regarding the procedure were encouraged and answered. The patient understands and consents to the procedure. As antibiotic prophylaxis, cefazolin 2 g was ordered pre-procedure and administered intravenously within one hour of incision.Patency of the right IJ vein was confirmed with ultrasound with image documentation. An appropriate skin site was determined. Region was prepped using maximum barrier technique including cap and mask, sterile gown, sterile gloves, large sterile sheet, and Chlorhexidine as cutaneous antisepsis. The region was infiltrated locally with 1% lidocaine. Intravenous versed 1mg  were administered as conscious sedation during continuous monitoring of the patient's level of consciousness and  physiological / cardiorespiratory status by the radiology RN, with a total moderate sedation time of 10 minutes. Under real-time ultrasound guidance, the right IJ vein was accessed with a 21 gauge micropuncture needle; the needle tip within the vein was confirmed with ultrasound image documentation. Needle exchanged over the 018 guidewire for transitional dilator, which allowed advancement of a Benson wire into the IVC. Over this, an MPA catheter was advanced. A Palindrome 19 hemodialysis catheter was tunneled from the right anterior chest wall approach to the right IJ dermatotomy site. The MPA catheter was exchanged over an Amplatz wire for serial vascular dilators which allow placement of a peel-away sheath, through which the catheter was advanced under intermittent fluoroscopy, positioned with its tips in the proximal and midright atrium. Spot chest radiograph confirms good catheter position. No pneumothorax. Catheter was flushed and primed per protocol. Catheter secured externally with O Prolene sutures. The right IJ dermatotomy site was closed with Dermabond. COMPLICATIONS: COMPLICATIONS None immediate COMPARISON:  None IMPRESSION: 1. Technically successful placement of tunneled right IJ hemodialysis catheter with ultrasound and fluoroscopic guidance. Ready for routine use. ACCESS: Remains approachable for percutaneous intervention as needed. Electronically Signed   By: Lucrezia Europe M.D.   On: 04/14/2020 08:15   DG Abd Portable 1V  Result Date: 04/13/2020 CLINICAL DATA:  Feeding tube placement. EXAM: PORTABLE ABDOMEN - 1 VIEW COMPARISON:  None. FINDINGS: A small bore feeding tube is noted with tip overlying the proximal-mid stomach. No dilated bowel loops are identified. IMPRESSION: Small bore feeding tube with tip overlying the proximal-mid stomach. Electronically Signed   By: Margarette Canada M.D.   On: 04/13/2020 15:19   VAS [place] UPPER EXT VEIN MAPPING (PRE-OP AVF)  Result Date: 04/13/2020 UPPER EXTREMITY  VEIN MAPPING  Indications: Pre-access. Comparison Study: no priro Performing Technologist: Abram Sander RVS  Examination Guidelines: A complete evaluation includes B-mode imaging, spectral Doppler, color Doppler, and power Doppler as needed of all accessible portions of each vessel. Bilateral testing is  considered an integral part of a complete examination. Limited examinations for reoccurring indications may be performed as noted. +-------------+-------------+----------+--------+ Left CephalicDiameter (cm)Depth (cm)Findings +-------------+-------------+----------+--------+  +-----------------+-------------+----------+---------+ Left Cephalic    Diameter (cm)Depth (cm)Findings  +-----------------+-------------+----------+---------+ Shoulder             0.43        0.64             +-----------------+-------------+----------+---------+ Prox upper arm       0.36        0.42             +-----------------+-------------+----------+---------+ Mid upper arm        0.36        0.40             +-----------------+-------------+----------+---------+ Dist upper arm       0.40        0.33   branching +-----------------+-------------+----------+---------+ Antecubital fossa    0.48        0.80             +-----------------+-------------+----------+---------+ Prox forearm         0.20        0.48   branching +-----------------+-------------+----------+---------+ Mid forearm          0.11        0.39             +-----------------+-------------+----------+---------+ Dist forearm         0.14        0.24             +-----------------+-------------+----------+---------+ +-----------------+-------------+----------+--------+ Left Basilic     Diameter (cm)Depth (cm)Findings +-----------------+-------------+----------+--------+ Prox upper arm       0.43        0.77            +-----------------+-------------+----------+--------+ Mid upper arm        0.33        0.79             +-----------------+-------------+----------+--------+ Dist upper arm       0.29        0.72            +-----------------+-------------+----------+--------+ Antecubital fossa    0.34        0.68            +-----------------+-------------+----------+--------+ Prox forearm         0.29        0.19            +-----------------+-------------+----------+--------+ Mid forearm          0.27        0.27            +-----------------+-------------+----------+--------+ Distal forearm       0.28        0.32            +-----------------+-------------+----------+--------+ Wrist                0.16        0.32            +-----------------+-------------+----------+--------+ *See table(s) above for measurements and observations.  Diagnosing physician: Monica Martinez MD Electronically signed by Monica Martinez MD on 04/13/2020 at 4:31:20 PM.    Final    DG FLUORO GUIDE LUMBAR PUNCTURE  Result Date: 04/14/2020 CLINICAL DATA:  64 year old male with history of altered mental status. 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 History of HIV. EXAM: DIAGNOSTIC LUMBAR PUNCTURE UNDER FLUOROSCOPIC GUIDANCE FLUOROSCOPY TIME:  Fluoroscopy Time:  1.2 minutes  Radiation Exposure Index (if provided by the fluoroscopic device): 7.8 mGy PROCEDURE: Informed consent was obtained from the patient prior to the procedure, including potential complications of headache, allergy, and pain. With the patient prone, the lower back was prepped with Betadine. 1% Lidocaine was used for local anesthesia. Lumbar puncture was performed at the L2-L3 level using a 22 gauge needle with return of clear CSF with an opening pressure of 18 cm water. 32 ml of CSF were obtained for laboratory studies. Closing pressure was not measurable. The patient tolerated the procedure well and there were no apparent complications. IMPRESSION: 1. Successful uncomplicated fluoroscopic guided lumbar puncture, as above. Electronically Signed   By: Vinnie Langton M.D.   On: 04/14/2020 18:46    Labs: BMET Recent Labs  Lab 04/10/20 2037 04/11/20 0018 04/11/20 0402 04/12/20 0434 04/13/20 1113 04/13/20 1829 04/14/20 0511 04/14/20 1137 04/14/20 1944 04/15/20 0452  NA 143  --  145 143 148*  --   --  143  --   --   K 4.2  --  4.4 4.4 4.5  --   --  3.8  --   --   CL 115*  --  117* 116* 119*  --   --  110  --   --   CO2 13*  --  11* 13* 12*  --   --  21*  --   --   GLUCOSE 106*  --  94 88 68*  --   --  107*  --   --   BUN 76*  --  78* 86* 94*  --   --  55*  --   --   CREATININE 9.98* 9.99* 9.99* 10.66* 11.51*  --   --  7.74*  --   --   CALCIUM 7.5*  --  7.6* 7.5* 8.1*  --   --  8.0*  --   --   PHOS  --   --   --   --  7.6* 8.3* 4.4  --  2.5 3.4   CBC Recent Labs  Lab 04/10/20 2037 04/10/20 2037 04/11/20 0018 04/11/20 0402 04/12/20 0434 04/14/20 1137  WBC 6.6   < > 6.8 9.1 6.5 4.9  NEUTROABS 4.7  --   --   --  5.0 3.6  HGB 10.3*   < > 11.4* 9.7* 8.2* 9.2*  HCT 31.5*   < > 34.5* 29.4* 24.5* 27.5*  MCV 90.0   < > 87.6 89.4 88.1 87.0  PLT 192   < > 201 195 173 180   < > = values in this interval not displayed.    Medications:    . carvedilol  3.125 mg Per Tube BID WC  . Chlorhexidine Gluconate Cloth  6 each Topical Q0600  . clopidogrel  75 mg Per Tube Daily  . darbepoetin (ARANESP) injection - DIALYSIS  40 mcg Intravenous Q Fri-HD  . dolutegravir  50 mg Oral Daily  . [START ON 04/16/2020] feeding supplement (NEPRO CARB STEADY)  1,000 mL Per Tube Q24H  . feeding supplement (PROSource TF)  45 mL Per Tube Daily  . heparin injection (subcutaneous)  5,000 Units Subcutaneous Q8H  . insulin aspart  0-9 Units Subcutaneous Q4H  . [START ON 04/16/2020] lamiVUDine  300 mg Per Tube Daily  . levETIRAcetam  500 mg Oral Q12H  . LORazepam  2 mg Intravenous Once  . pantoprazole sodium  40 mg Per Tube Daily  . pravastatin  80 mg Per Tube QHS  . [  START ON 04/16/2020] sulfamethoxazole-trimethoprim  1 tablet Oral Once per day on Mon Wed Fri   . tamsulosin  0.4 mg Oral Daily      [name], MD [facility] 04/15/2020, 2:01 PM

## 2020-04-15 NOTE — Progress Notes (Addendum)
PROGRESS NOTE    Richard [name]  KZL:[id number] DOB: 1956-03-10 DOA: 04/10/2020 PCP: [name], MD    Chief Complaint  Patient presents with  . Seizures    Brief Narrative:  Richard [name]  is a 64 y.o. male, with medical history significant ofdiabetes, osteoarthritis, hypertension,CKD 5,neuropathies, previous CVA with residual right-sided weakness, migraine headaches , patient with extremely poor compliance, he has refused hemodialysis access in the past, presents to ED via EMS status post seizures, he is known history of seizures, but has not been noncompliant with his seizure medications, seizures was witnessed by his roommate, seizures lasted for 4 minutes, she will patient's extremely encephalopathic, unable to provide any history, but upon my evaluation is more appropriate, patient report actually he never got his seizure medications, was not taking it, patient denies any fever, chest pain, shortness of breath, no dysuria or polyuria, reports usually ambulates with a walker, he has right-sided weakness at baseline. -in ED patient was postictal, he was loaded with Keppra, ED discussed with neurology who recommended to resume his home dose Keppra 500 mg p.o. twice daily, as well patient was noted to be hypertensive 230/118, patient received IV labetalol, IV hydralazine, as well patient with significant labs abnormalities which appears to be at baseline, including creatinine of 9.98, BUN of 76, baseline anemia with hemoglobin of 10.3, hospitalist were requested to admit  Subjective:   cortrak placed for nutrition and medication He is still drowsy, able to answer a few questions, then drift back to sleep He denies pain, he states he feels hungry ,he knows he is in the hospital at [facility], he is not oriented to time     Assessment & Plan:   Principal Problem:   Encephalopathy Active Problems:   Tobacco abuse   Protein-calorie malnutrition, mild (HCC)    Hyperlipidemia   Neuropathy in diabetes ([place])   GERD (gastroesophageal reflux disease)   Diabetes mellitus, type II, insulin dependent (HCC)   CKD (chronic kidney disease) stage 5, GFR less than 15 ml/min (HCC)   History of cerebrovascular accident (CVA) with residual deficit   Seizure ([place])   AIDS (acquired immune deficiency syndrome) ([place])   ESRD (end stage renal disease) ([place])   Acute metabolic encephalopathy, likely multifactorial including seizure, hypertension, acute stroke, uremia. -Initial CT scan on admission no acute findings -Due to acute mental status change on July 22 a.m. ,code stroke initiated, stat CT scan no acute findings, stat MRI showed "2 mm acute or early subacute infarct within the left parietal lobe periatrial white matter" --on cEEG stopped on July 24 "This study is suggestive of moderate diffuse encephalopathy, nonspecific etiology.No seizures or epileptiform discharges were seen throughout the recording" -HIV screening positive, s/p  Lumbar puncture on 7/24, CSF Gram stain negative. Cryptococcus PCR negative. VDRL CSF is pending. - he is still drowsy drifted back to sleep during conversation -currently npo, getting tube feeds and oral med through cortrak   Recurrent seizures, present on admission -He is noncompliant with his seizure medication -He is also uremic, --on cEEG stopped on July 24 "This study is suggestive of moderate diffuse encephalopathy, nonspecific etiology.No seizures or epileptiform discharges were seen throughout the recording" -s/p  Lumbar puncture on 7/24, CSF Gram stain negative. Cryptococcus PCR negative. VDRL CSF is pending. -was on IV Keppra and IV Vimpat , now on iv keppra only per neurology recommendation -Appreciate neurology recommendation  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 -Continue with Plavix and  statin,  -Plavix held for lumbar puncture, resumed on  7/25   Hypertensive urgency, present on admission --On July 22, mri showed small acute stroke, allowed permissive hypertension on July 22 ,  -start low-dose Coreg with holding parameters , on as needed labetalol and hydralazine -Expect improvement with starting dialysis  CKD stage V/uremia/anemia of chronic disease -Creatinine in 10-11 range, creatinine appears to be at baseline . 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 Kaposi sarcoma?  Insulin-dependent DM2, controlled -Is on Novolin 70/30 at home -Had hypoglycemia requiring D5 infusion briefly on 7/23,  -Blood glucose this morning is 107 , continue hypoglycemic protocol,  continue SSI.  HLD -Meds per tube currently  Tobacco use -Counseled to quit using this.   history of severe noncompliance -He was counseled.    DVT prophylaxis: heparin injection 5,000 Units Start: 04/15/20 1400 Place and maintain sequential compression device Start: 04/14/20 1421 SCDs Start: 04/10/20 2332   Code Status: Full Family Communication: not able to reach daughter Disposition:   Status is: Inpatient    Dispo: The patient is from: Home, he lives with a roommate              Anticipated d/c is to: To be determined              Anticipated d/c date is: To be determined              Patient currently renal failure needing dialysis, infectious disease management for AIDS, neurology for seizure /stroke management, palliative care for goals of care discussion  Consultants:  nephrology  Neurology Radiology Infectious disease Palliative care  Procedures:   Bedside lumbar puncture attempted July 24 by  neurology  Lumbar puncture by IR on July 24  Dialysis catheter placement by interventional radiology  Hemodialysis  Antimicrobials:   None     Objective: Vitals:   04/15/20 0434 04/15/20 0500 04/15/20 0725 04/15/20 1113  BP: (!) 151/82  (!) 150/77 (!) 167/82  Pulse: 91  84 90  Resp: 14  16 16   Temp:   98.4 F (36.9 C) 98.9 F (37.2 C)  TempSrc:   Oral Oral  SpO2: 100%  99% 100%  Weight:  68.4 kg      Intake/Output Summary (Last 24 hours) at 04/15/2020 1134 Last data filed at 04/15/2020 0300 Gross per 24 hour  Intake 390.48 ml  Output 900 ml  Net -509.52 ml   Filed Weights   04/14/20 0500 04/14/20 1355 04/15/20 0500  Weight: 69.1 kg 67.6 kg 68.4 kg    Examination:  General exam: Talk in short sentences, drifts back to sleep during conversation Respiratory system: Clear to auscultation. Respiratory effort normal. Cardiovascular system: S1 & S2 heard, RRR. No JVD, no murmur, No pedal edema. Gastrointestinal system: Abdomen is nondistended, soft and nontender. No organomegaly or masses felt. Normal bowel sounds heard. Central nervous system: Chronic right arm contraction Extremities: Chronic right arm contraction Skin: dark skin lesions  Psychiatry: Drowsy    Data Reviewed: I have personally reviewed following labs and imaging studies  CBC: Recent Labs  Lab 04/10/20 2037 04/11/20 0018 04/11/20 0402 04/12/20 0434 04/14/20 1137  WBC 6.6 6.8 9.1 6.5  4.9  NEUTROABS 4.7  --   --  5.0 3.6  HGB 10.3* 11.4* 9.7* 8.2* 9.2*  HCT 31.5* 34.5* 29.4* 24.5* 27.5*  MCV 90.0 87.6 89.4 88.1 87.0  PLT 192 201 195 173 194    Basic Metabolic Panel: Recent Labs  Lab 04/10/20 2037 04/10/20 2037 04/11/20 0018 04/11/20 0402 04/12/20 0434 04/13/20 1113 04/13/20 1829 04/14/20 0511 04/14/20 1137 04/14/20 1944 04/15/20 0452  NA 143  --   --  145 143 148*  --   --  143  --   --   K 4.2  --   --  4.4 4.4 4.5  --   --  3.8  --   --   CL 115*  --   --  117* 116* 119*   --   --  110  --   --   CO2 13*  --   --  11* 13* 12*  --   --  21*  --   --   GLUCOSE 106*  --   --  94 88 68*  --   --  107*  --   --   BUN 76*  --   --  78* 86* 94*  --   --  55*  --   --   CREATININE 9.98*   < > 9.99* 9.99* 10.66* 11.51*  --   --  7.74*  --   --   CALCIUM 7.5*  --   --  7.6* 7.5* 8.1*  --   --  8.0*  --   --   MG  --   --   --   --   --   --  1.6* 1.5*  --  1.6* 2.2  PHOS  --   --   --   --   --  7.6* 8.3* 4.4  --  2.5 3.4   < > = values in this interval not displayed.    GFR: CrCl cannot be calculated (Unknown ideal weight.).  Liver Function Tests: Recent Labs  Lab 04/10/20 2037 04/13/20 1113 04/14/20 1137  AST 13*  --  19  ALT 10  --  8  ALKPHOS 95  --  79  BILITOT 0.5  --  0.4  PROT 6.6  --  6.1*  ALBUMIN 2.8* 2.3* 2.3*    CBG: Recent Labs  Lab 04/14/20 1607 04/14/20 2050 04/14/20 2352 04/15/20 0412 04/15/20 0730  GLUCAP 113* 77 83 116* 120*     Recent Results (from the past 240 hour(s))  SARS Coronavirus 2 by RT PCR (hospital order, performed in [facility] hospital lab) Nasopharyngeal Nasopharyngeal Swab     Status: None   Collection Time: 04/11/20  3:58 AM   Specimen: Nasopharyngeal Swab  Result Value Ref Range Status   SARS Coronavirus 2 NEGATIVE NEGATIVE Final    Comment: (NOTE) SARS-CoV-2 target nucleic acids are NOT DETECTED.  The SARS-CoV-2 RNA is generally detectable in upper and lower respiratory specimens during the acute phase of infection. The lowest concentration of SARS-CoV-2 viral copies this assay can detect is 250 copies / mL. A negative result does not preclude SARS-CoV-2 infection and should not be used as the sole basis for treatment or other patient management decisions.  A negative result may occur with improper specimen collection / handling, submission of specimen other than nasopharyngeal swab, presence of viral mutation(s) within the areas targeted by this assay, and inadequate number of viral copies (<250  copies / mL). A  negative result must be combined with clinical observations, patient history, and epidemiological information.  Fact Sheet for Patients:   StrictlyIdeas.no  Fact Sheet for Healthcare Providers: BankingDealers.co.za  This test is not yet approved or  cleared by the [place] FDA and has been authorized for detection and/or diagnosis of SARS-CoV-2 by FDA under an Emergency Use Authorization (EUA).  This EUA will remain in effect (meaning this test can be used) for the duration of the COVID-19 declaration under Section 564(b)(1) of the Act, 21 U.S.C. section 360bbb-3(b)(1), unless the authorization is terminated or revoked sooner.  Performed at [facility], [place] [address] [place], [place] [address]   Culture, blood (routine x 2)     Status: None (Preliminary result)   Collection Time: 04/14/20 11:31 AM   Specimen: BLOOD RIGHT HAND  Result Value Ref Range Status   Specimen Description BLOOD RIGHT HAND  Final   Special Requests   Final    BOTTLES DRAWN AEROBIC ONLY Blood Culture results may not be optimal due to an inadequate volume of blood received in culture bottles   Culture   Final    NO GROWTH < 24 HOURS Performed at [facility], [place] [address], [place], [place] [address]    Report Status PENDING  Incomplete  Culture, blood (routine x 2)     Status: None (Preliminary result)   Collection Time: 04/14/20 11:36 AM   Specimen: BLOOD RIGHT HAND  Result Value Ref Range Status   Specimen Description BLOOD RIGHT HAND  Final   Special Requests   Final    BOTTLES DRAWN AEROBIC ONLY Blood Culture results may not be optimal due to an inadequate volume of blood received in culture bottles   Culture   Final    NO GROWTH < 24 HOURS Performed at [facility], [place] [address], [place], [place] [address]    Report Status PENDING  Incomplete  CSF culture     Status: None (Preliminary result)   Collection  Time: 04/14/20  6:27 PM   Specimen: PATH Cytology CSF; Cerebrospinal Fluid  Result Value Ref Range Status   Specimen Description CSF  Final   Special Requests NONE  Final   Gram Stain NO WBC SEEN NO ORGANISMS SEEN CYTOSPIN SMEAR   Final   Culture   Final    NO GROWTH < 24 HOURS Performed at [facility], [address] [place],  [address]    Report Status PENDING  Incomplete         Radiology Studies: IR Fluoro Guide CV Line Right  Result Date: 04/14/2020 CLINICAL DATA:  End-stage renal disease, needs access for hemodialysis EXAM: TUNNELED HEMODIALYSIS CATHETER PLACEMENT WITH ULTRASOUND AND FLUOROSCOPIC GUIDANCE TECHNIQUE: The procedure, risks, benefits, and alternatives were explained to the patient. Questions regarding the procedure were encouraged and answered. The patient understands and consents to the procedure. As antibiotic prophylaxis, cefazolin 2 g was ordered pre-procedure and administered intravenously within one hour of incision.Patency of the right IJ vein was confirmed with ultrasound with image documentation. An appropriate skin site was determined. Region was prepped using maximum barrier technique including cap and mask, sterile gown, sterile gloves, large sterile sheet, and Chlorhexidine as cutaneous antisepsis. The region was infiltrated locally with 1% lidocaine. Intravenous versed 1mg  were administered as conscious sedation during continuous monitoring of the patient's level of consciousness and physiological / cardiorespiratory status by the radiology RN, with a total moderate sedation time of 10 minutes. Under  real-time ultrasound guidance, the right IJ vein was accessed with a 21 gauge micropuncture needle; the needle tip within the vein was confirmed with ultrasound image documentation. Needle exchanged over the 018 guidewire for transitional dilator, which allowed advancement of a Benson wire into the IVC. Over this, an MPA catheter was advanced. A  Palindrome 19 hemodialysis catheter was tunneled from the right anterior chest wall approach to the right IJ dermatotomy site. The MPA catheter was exchanged over an Amplatz wire for serial vascular dilators which allow placement of a peel-away sheath, through which the catheter was advanced under intermittent fluoroscopy, positioned with its tips in the proximal and midright atrium. Spot chest radiograph confirms good catheter position. No pneumothorax. Catheter was flushed and primed per protocol. Catheter secured externally with O Prolene sutures. The right IJ dermatotomy site was closed with Dermabond. COMPLICATIONS: COMPLICATIONS None immediate COMPARISON:  None IMPRESSION: 1. Technically successful placement of tunneled right IJ hemodialysis catheter with ultrasound and fluoroscopic guidance. Ready for routine use. ACCESS: Remains approachable for percutaneous intervention as needed. Electronically Signed   By: Lucrezia Europe M.D.   On: 04/14/2020 08:15   IR US Guide Vasc Access Right  Result Date: 04/14/2020 CLINICAL DATA:  End-stage renal disease, needs access for hemodialysis EXAM: TUNNELED HEMODIALYSIS CATHETER PLACEMENT WITH ULTRASOUND AND FLUOROSCOPIC GUIDANCE TECHNIQUE: The procedure, risks, benefits, and alternatives were explained to the patient. Questions regarding the procedure were encouraged and answered. The patient understands and consents to the procedure. As antibiotic prophylaxis, cefazolin 2 g was ordered pre-procedure and administered intravenously within one hour of incision.Patency of the right IJ vein was confirmed with ultrasound with image documentation. An appropriate skin site was determined. Region was prepped using maximum barrier technique including cap and mask, sterile gown, sterile gloves, large sterile sheet, and Chlorhexidine as cutaneous antisepsis. The region was infiltrated locally with 1% lidocaine. Intravenous versed 1mg  were administered as conscious sedation during  continuous monitoring of the patient's level of consciousness and physiological / cardiorespiratory status by the radiology RN, with a total moderate sedation time of 10 minutes. Under real-time ultrasound guidance, the right IJ vein was accessed with a 21 gauge micropuncture needle; the needle tip within the vein was confirmed with ultrasound image documentation. Needle exchanged over the 018 guidewire for transitional dilator, which allowed advancement of a Benson wire into the IVC. Over this, an MPA catheter was advanced. A Palindrome 19 hemodialysis catheter was tunneled from the right anterior chest wall approach to the right IJ dermatotomy site. The MPA catheter was exchanged over an Amplatz wire for serial vascular dilators which allow placement of a peel-away sheath, through which the catheter was advanced under intermittent fluoroscopy, positioned with its tips in the proximal and midright atrium. Spot chest radiograph confirms good catheter position. No pneumothorax. Catheter was flushed and primed per protocol. Catheter secured externally with O Prolene sutures. The right IJ dermatotomy site was closed with Dermabond. COMPLICATIONS: COMPLICATIONS None immediate COMPARISON:  None IMPRESSION: 1. Technically successful placement of tunneled right IJ hemodialysis catheter with ultrasound and fluoroscopic guidance. Ready for routine use. ACCESS: Remains approachable for percutaneous intervention as needed. Electronically Signed   By: Lucrezia Europe M.D.   On: 04/14/2020 08:15   DG Abd Portable 1V  Result Date: 04/13/2020 CLINICAL DATA:  Feeding tube placement. EXAM: PORTABLE ABDOMEN - 1 VIEW COMPARISON:  None. FINDINGS: A small bore feeding tube is noted with tip overlying the proximal-mid stomach. No dilated bowel loops are identified. IMPRESSION: Small bore feeding tube  with tip overlying the proximal-mid stomach. Electronically Signed   By: Margarette Canada M.D.   On: 04/13/2020 15:19   VAS [place] UPPER EXT VEIN  MAPPING (PRE-OP AVF)  Result Date: 04/13/2020 UPPER EXTREMITY VEIN MAPPING  Indications: Pre-access. Comparison Study: no priro Performing Technologist: Abram Sander RVS  Examination Guidelines: A complete evaluation includes B-mode imaging, spectral Doppler, color Doppler, and power Doppler as needed of all accessible portions of each vessel. Bilateral testing is considered an integral part of a complete examination. Limited examinations for reoccurring indications may be performed as noted. +-------------+-------------+----------+--------+ Left CephalicDiameter (cm)Depth (cm)Findings +-------------+-------------+----------+--------+  +-----------------+-------------+----------+---------+ Left Cephalic    Diameter (cm)Depth (cm)Findings  +-----------------+-------------+----------+---------+ Shoulder             0.43        0.64             +-----------------+-------------+----------+---------+ Prox upper arm       0.36        0.42             +-----------------+-------------+----------+---------+ Mid upper arm        0.36        0.40             +-----------------+-------------+----------+---------+ Dist upper arm       0.40        0.33   branching +-----------------+-------------+----------+---------+ Antecubital fossa    0.48        0.80             +-----------------+-------------+----------+---------+ Prox forearm         0.20        0.48   branching +-----------------+-------------+----------+---------+ Mid forearm          0.11        0.39             +-----------------+-------------+----------+---------+ Dist forearm         0.14        0.24             +-----------------+-------------+----------+---------+ +-----------------+-------------+----------+--------+ Left Basilic     Diameter (cm)Depth (cm)Findings +-----------------+-------------+----------+--------+ Prox upper arm       0.43        0.77             +-----------------+-------------+----------+--------+ Mid upper arm        0.33        0.79            +-----------------+-------------+----------+--------+ Dist upper arm       0.29        0.72            +-----------------+-------------+----------+--------+ Antecubital fossa    0.34        0.68            +-----------------+-------------+----------+--------+ Prox forearm         0.29        0.19            +-----------------+-------------+----------+--------+ Mid forearm          0.27        0.27            +-----------------+-------------+----------+--------+ Distal forearm       0.28        0.32            +-----------------+-------------+----------+--------+ Wrist                0.16        0.32            +-----------------+-------------+----------+--------+ *  See table(s) above for measurements and observations.  Diagnosing physician: Monica Martinez MD Electronically signed by Monica Martinez MD on 04/13/2020 at 4:31:20 PM.    Final    DG FLUORO GUIDE LUMBAR PUNCTURE  Result Date: 04/14/2020 CLINICAL DATA:  64 year old male with history of altered mental status. 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 History of HIV. EXAM: DIAGNOSTIC LUMBAR PUNCTURE UNDER FLUOROSCOPIC GUIDANCE FLUOROSCOPY TIME:  Fluoroscopy Time:  1.2 minutes Radiation Exposure Index (if provided by the fluoroscopic device): 7.8 mGy PROCEDURE: Informed consent was obtained from the patient prior to the procedure, including potential complications of headache, allergy, and pain. With the patient prone, the lower back was prepped with Betadine. 1% Lidocaine was used for local anesthesia. Lumbar puncture was performed at the L2-L3 level using a 22 gauge needle with return of clear CSF with an opening pressure of 18 cm water. 32 ml of CSF were obtained for laboratory studies. Closing pressure was not measurable. The patient tolerated the procedure well and there were no apparent complications. IMPRESSION: 1.  Successful uncomplicated fluoroscopic guided lumbar puncture, as above. Electronically Signed   By: Vinnie Langton M.D.   On: 04/14/2020 18:46        Scheduled Meds: . Chlorhexidine Gluconate Cloth  6 each Topical Q0600  . clopidogrel  75 mg Per Tube Daily  . darbepoetin (ARANESP) injection - DIALYSIS  40 mcg Intravenous Q Fri-HD  . feeding supplement (PROSource TF)  45 mL Per Tube Daily  . heparin injection (subcutaneous)  5,000 Units Subcutaneous Q8H  . insulin aspart  0-9 Units Subcutaneous Q4H  . levETIRAcetam  500 mg Oral Q12H  . LORazepam  2 mg Intravenous Once  . pantoprazole sodium  40 mg Per Tube Daily  . pravastatin  80 mg Per Tube QHS  . tamsulosin  0.4 mg Oral Daily   Continuous Infusions: . feeding supplement (NEPRO CARB STEADY) 1,000 mL (04/14/20 1315)  . levETIRAcetam       LOS: 5 days      Labs: Basic Metabolic Panel: Recent Labs  Lab 04/10/20 2037 04/10/20 2037 04/11/20 0018 04/11/20 0402 04/12/20 0434 04/13/20 1113 04/13/20 1829 04/14/20 0511 04/14/20 1137 04/14/20 1944 04/15/20 0452  NA 143  --   --  145 143 148*  --   --  143  --   --   K 4.2  --   --  4.4 4.4 4.5  --   --  3.8  --   --   CL 115*  --   --  117* 116* 119*  --   --  110  --   --   CO2 13*  --   --  11* 13* 12*  --   --  21*  --   --   GLUCOSE 106*  --   --  94 88 68*  --   --  107*  --   --   BUN 76*  --   --  78* 86* 94*  --   --  55*  --   --   CREATININE 9.98*   < > 9.99* 9.99* 10.66* 11.51*  --   --  7.74*  --   --   CALCIUM 7.5*  --   --  7.6* 7.5* 8.1*  --   --  8.0*  --   --   MG  --   --   --   --   --   --  1.6* 1.5*  --  1.6* 2.2  PHOS  --   --   --   --   --  7.6* 8.3* 4.4  --  2.5 3.4   < > = values in this interval not displayed.   Liver Function Tests: Recent Labs  Lab 04/10/20 2037 04/13/20 1113 04/14/20 1137  AST 13*  --  19  ALT 10  --  8  ALKPHOS 95  --  79  BILITOT 0.5  --  0.4  PROT 6.6  --  6.1*  ALBUMIN 2.8* 2.3* 2.3*   No results for  input(s): LIPASE, AMYLASE in the last 168 hours. No results for input(s): AMMONIA in the last 168 hours. CBC: Recent Labs  Lab 04/10/20 2037 04/11/20 0018 04/11/20 0402 04/12/20 0434 04/14/20 1137  WBC 6.6 6.8 9.1 6.5 4.9  NEUTROABS 4.7  --   --  5.0 3.6  HGB 10.3* 11.4* 9.7* 8.2* 9.2*  HCT 31.5* 34.5* 29.4* 24.5* 27.5*  MCV 90.0 87.6 89.4 88.1 87.0  PLT 192 201 195 173 180   Cardiac Enzymes: No results for input(s): CKTOTAL, CKMB, CKMBINDEX, TROPONINI in the last 168 hours. BNP: BNP (last 3 results) No results for input(s): BNP in the last 8760 hours.  ProBNP (last 3 results) Recent Labs    06/22/19 1505  PROBNP 170.0*    CBG: Recent Labs  Lab 04/14/20 1607 04/14/20 2050 04/14/20 2352 04/15/20 0412 04/15/20 0730  GLUCAP 113* 77 83 116* 120*       Signed:  Florencia Reasons MD, PhD, FACP  [facility] 04/15/2020, 11:34 AM  Time spent more than 35 minutes case discussed with nephrology, neurology, ID and critical care I have personally reviewed and interpreted on  04/15/2020 daily labs, tele strips, imagings as discussed above under date review session and assessment and plans.  I reviewed all nursing notes, pharmacy notes, consultant notes,  vitals, pertinent old records  I have discussed plan of care as described above with RN , patient on 04/15/2020  Voice Recognition /Dragon dictation system was used to create this note, attempts have been made to correct errors. Please contact the author with questions and/or clarifications.   Florencia Reasons, MD PhD FACP [facility]  Available via Epic secure chat 7am-7pm for nonurgent issues Please page for urgent issues To page the attending provider between 7A-7P or the covering provider during after hours 7P-7A, please log into the web site [email or web address] and access using universal [place] password for that web site. If you do not have the password, please call the hospital operator.    04/15/2020, 11:34 AM

## 2020-04-15 NOTE — Progress Notes (Signed)
MD from Infectious Disease, Dr. Tommy Medal. Informed this nurse that tube feeding will have to be held for a few hours before and after receiving TICICAY. Will pass this along in report. [place]

## 2020-04-16 DIAGNOSIS — K2100 Gastro-esophageal reflux disease with esophagitis, without bleeding: Secondary | ICD-10-CM

## 2020-04-16 DIAGNOSIS — R7989 Other specified abnormal findings of blood chemistry: Secondary | ICD-10-CM | POA: Diagnosis not present

## 2020-04-16 DIAGNOSIS — N185 Chronic kidney disease, stage 5: Secondary | ICD-10-CM | POA: Diagnosis not present

## 2020-04-16 DIAGNOSIS — E785 Hyperlipidemia, unspecified: Secondary | ICD-10-CM

## 2020-04-16 DIAGNOSIS — R404 Transient alteration of awareness: Secondary | ICD-10-CM | POA: Diagnosis not present

## 2020-04-16 DIAGNOSIS — E0941 Drug or chemical induced diabetes mellitus with neurological complications with diabetic mononeuropathy: Secondary | ICD-10-CM

## 2020-04-16 DIAGNOSIS — Z720 Tobacco use: Secondary | ICD-10-CM

## 2020-04-16 DIAGNOSIS — G9340 Encephalopathy, unspecified: Secondary | ICD-10-CM | POA: Diagnosis not present

## 2020-04-16 DIAGNOSIS — E119 Type 2 diabetes mellitus without complications: Secondary | ICD-10-CM | POA: Diagnosis not present

## 2020-04-16 LAB — GLUCOSE, CAPILLARY
Glucose-Capillary: 100 mg/dL — ABNORMAL HIGH (ref 70–99)
Glucose-Capillary: 102 mg/dL — ABNORMAL HIGH (ref 70–99)
Glucose-Capillary: 102 mg/dL — ABNORMAL HIGH (ref 70–99)
Glucose-Capillary: 109 mg/dL — ABNORMAL HIGH (ref 70–99)
Glucose-Capillary: 111 mg/dL — ABNORMAL HIGH (ref 70–99)
Glucose-Capillary: 122 mg/dL — ABNORMAL HIGH (ref 70–99)
Glucose-Capillary: 73 mg/dL (ref 70–99)

## 2020-04-16 LAB — CBC
HCT: 26.4 % — ABNORMAL LOW (ref 39.0–52.0)
Hemoglobin: 8.4 g/dL — ABNORMAL LOW (ref 13.0–17.0)
MCH: 29 pg (ref 26.0–34.0)
MCHC: 31.8 g/dL (ref 30.0–36.0)
MCV: 91 fL (ref 80.0–100.0)
Platelets: 155 10*3/uL (ref 150–400)
RBC: 2.9 MIL/uL — ABNORMAL LOW (ref 4.22–5.81)
RDW: 14 % (ref 11.5–15.5)
WBC: 4.4 10*3/uL (ref 4.0–10.5)
nRBC: 0 % (ref 0.0–0.2)

## 2020-04-16 LAB — RENAL FUNCTION PANEL
Albumin: 2.1 g/dL — ABNORMAL LOW (ref 3.5–5.0)
Anion gap: 11 (ref 5–15)
BUN: 48 mg/dL — ABNORMAL HIGH (ref 8–23)
CO2: 25 mmol/L (ref 22–32)
Calcium: 7.7 mg/dL — ABNORMAL LOW (ref 8.9–10.3)
Chloride: 103 mmol/L (ref 98–111)
Creatinine, Ser: 6.56 mg/dL — ABNORMAL HIGH (ref 0.61–1.24)
GFR calc Af Amer: 10 mL/min — ABNORMAL LOW (ref >60)
GFR calc non Af Amer: 8 mL/min — ABNORMAL LOW (ref >60)
Glucose, Bld: 106 mg/dL — ABNORMAL HIGH (ref 70–99)
Phosphorus: 3.5 mg/dL (ref 2.5–4.6)
Potassium: 3.5 mmol/L (ref 3.5–5.1)
Sodium: 139 mmol/L (ref 135–145)

## 2020-04-16 LAB — VDRL, CSF: VDRL Quant, CSF: NONREACTIVE

## 2020-04-16 LAB — FUNGAL STAIN REFLEX

## 2020-04-16 LAB — FUNGUS STAIN

## 2020-04-16 MED ORDER — HEPARIN SODIUM (PORCINE) 1000 UNIT/ML IJ SOLN
INTRAMUSCULAR | Status: AC
  Administered 2020-04-16: 1000 [IU]
  Filled 2020-04-16: qty 4

## 2020-04-16 NOTE — Progress Notes (Signed)
OT Cancellation Note  Patient Details Name: Richard [name] MRN: [id number] DOB: [birth year]   Cancelled Treatment:    Reason Eval/Treat Not Completed: Patient at procedure or test/ unavailable. Pt at dialysis treatment - will follow up with later when available.  [name]/OTS [name] 04/16/2020, 8:06 AM

## 2020-04-16 NOTE — Progress Notes (Signed)
PROGRESS NOTE    Richard [name]  YNW:[id number] DOB: 07/24/1956 DOA: 04/10/2020 PCP: [name], MD   Brief Narrative:  HPI On 04/10/2020 by Dr. Phillips Climes [name]  is a 64 y.o. male, with medical history significant ofdiabetes, osteoarthritis, hypertension,CKD 5,neuropathies, previous CVA with residual right-sided weakness, migraine headaches , patient with extremely poor compliance, he has refused hemodialysis access in the past, presents to ED via EMS status post seizures, he is known history of seizures, but has not been noncompliant with his seizure medications, seizures was witnessed by his roommate, seizures lasted for 4 minutes, she will patient's extremely encephalopathic, unable to provide any history, but upon my evaluation is more appropriate, patient report actually he never got his seizure medications, was not taking it, patient denies any fever, chest pain, shortness of breath, no dysuria or polyuria, reports usually ambulates with a walker, he has right-sided weakness at baseline. Assessment & Plan   Acute metabolic encephalopathy -likely multifactorial including seizure, hypertension, acute stroke, uremia -Patient continues to be somewhat sleepy during conversation although per documentation, appears to be improving since previous days -See diagnoses below  Acute CVA -Also with history of CVA with residual right-sided weakness and chronic right upper extremity contracture -Initial CT scan on admission no acute findings -Due to acute mental status change on the morning of July 22, code stroke initiated, stat CT scan no acute findings, stat MRI showed "2 mm acute or early subacute infarct within the left parietal lobe periatrial white matter" -HIV screening positive -s/p Lumbar puncture on 7/24, CSF Gram stain negative. Cryptococcus PCR negative. VDRL CSF is pending. -PT, OT recommended SNF -Currently has cortrak -Neurology consulted and  appreciated -Continue Plavix, statin  Recurrent seizures -present on admission -He is noncompliant with his seizure medication -cEEG stopped on July 24 "This study is suggestive of moderate diffuse encephalopathy, nonspecific etiology.No seizures or epileptiform discharges were seen throughout the recording" -s/p  Lumbar puncture on 7/24, CSF Gram stain negative. Cryptococcus PCR negative. VDRL CSF is pending. -was on IV Keppra and IV Vimpat , now on iv keppra only per neurology recommendation -Appreciate neurology recommendation  Hypertensive urgency, present on admission -Resolved -Currently on Coreg  Chronic kidney disease, stage V/uremia-ESRD  -Creatinine in 10-11 range, creatinine appears to be at baseline. 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 Kaposi sarcoma?  Diabetes mellitus, type II with episode of hypoglycemia -Trolled, hemoglobin A1c 5.2 (02/14/2020) -On Novolin 70/30 at home -Had hypoglycemia requiring D5 infusion briefly on 7/23,  -Continue insulin sliding scale with CBG monitoring  Hyperlipidemia -Total cholesterol 147, HDL 31, LDL 91, triglycerides 125 -Continue statin  Tobacco use -Cessation discussed by previous physician   History of severe noncompliance -Per previous documentation, patient counseled  Goals of care discussion -Palliative  care consulted and appreciated  DVT Prophylaxis  heaprin  Code Status: Full  Family Communication: None at bedside  Disposition Plan:  Status is: Inpatient  Remains inpatient appropriate because:Altered mental status, IV treatments appropriate due to  intensity of illness or inability to take PO and Inpatient level of care appropriate due to severity of illness   Dispo: The patient is from: Home              Anticipated d/c is to: TBD              Anticipated d/c date is: > 3 days              Patient currently is not medically stable to d/c.   Consultants Nephrology Neurology Advanced radiology Infectious disease Palliative care  Procedures  Bedside lumbar puncture attempted July 24 by neurology Lumbar puncture by IR on July 24 Dialysis catheter placement by interventional radiology   Antibiotics   Anti-infectives (From admission, onward)   Start     Dose/Rate Route Frequency Ordered Stop   04/16/20 1000  lamiVUDine (EPIVIR) tablet 300 mg     Discontinue    Note to Pharmacy: I DO NOT renally dose lamivudine and I have never had problems NOT adjusting it. I want pt to be on Dovato but still not on formulary here (unfortunately)   300 mg Per Tube Daily 04/15/20 1307     04/16/20 0900  sulfamethoxazole-trimethoprim (BACTRIM DS) 800-160 MG per tablet 1 tablet     Discontinue     1 tablet Oral Once per day on Mon Wed Fri 04/15/20 1350     04/15/20 1245  dolutegravir (TIVICAY) tablet 50 mg     Discontinue     50 mg Oral Daily 04/15/20 1202     04/15/20 1245  lamiVUDine (EPIVIR) tablet 300 mg  Status:  Discontinued       Note to Pharmacy: I DO NOT renally dose lamivudine and I have never had problems NOT adjusting it. I want pt to be on Dovato but still not on formulary here (unfortunately)   300 mg Oral Daily 04/15/20 1202 04/15/20 1307   04/13/20 1737  ceFAZolin (ANCEF) 2-4 GM/100ML-% IVPB       Note to Pharmacy: Desiree Hane   : cabinet override      04/13/20 1737  04/14/20 0544   04/13/20 0000  ceFAZolin (ANCEF) IVPB 2g/100 mL premix        2 g 200 mL/hr over 30 Minutes Intravenous To Radiology 04/12/20 1454 04/13/20 0834      Subjective:   Richard [name] seen and examined today.  Seen hemodialysis today.  Not very interactive this morning; was extremely sleepy.  Objective:   Vitals:   04/16/20 1110 04/16/20 1115 04/16/20 1215 04/16/20 1616  BP: 113/68 (!) 132/72 (!) 155/82 (!) 157/93  Pulse: 101 98  88  Resp: 20 20  15   Temp:  97.6 F (36.4 C) 98.2 F (36.8 C) 98.2 F (36.8 C)  TempSrc:  Axillary Oral Oral  SpO2: 100%   100%  Weight: 65.8 kg       Intake/Output Summary (Last 24 hours) at 04/16/2020 1634 Last data filed at 04/16/2020 1110 Gross per 24 hour  Intake --  Output 2300 ml  Net -2300 ml   Filed Weights   04/16/20 0500 04/16/20 0730 04/16/20 1110  Weight: 70.7 kg 67.8 kg 65.8 kg    Exam  General: Well developed, chronically ill appearing, NAD  HEENT: NCAT, mucous membranes moist. Cortrak  Cardiovascular: S1 S2 auscultated, RRR  Respiratory: Clear to auscultation bilaterally   Abdomen: Soft, nontender, nondistended, + bowel sounds  Extremities: warm dry without cyanosis clubbing or edema. RUE cnotracture  Neuro: Awake,  falls asleep quickly.   Skin: Diffuse rash   Data Reviewed: I have personally reviewed following labs and imaging studies  CBC: Recent Labs  Lab 04/10/20 2037 04/10/20 2037 04/11/20 0018 04/11/20 0402 04/12/20 0434 04/14/20 1137 04/16/20 0740  WBC 6.6   < > 6.8 9.1 6.5 4.9 4.4  NEUTROABS 4.7  --   --   --  5.0 3.6  --   HGB 10.3*   < > 11.4* 9.7* 8.2* 9.2* 8.4*  HCT 31.5*   < > 34.5* 29.4* 24.5* 27.5* 26.4*  MCV 90.0   < > 87.6 89.4 88.1 87.0 91.0  PLT 192   < > 201 195 173 180 155   < > = values in this interval not displayed.   Basic Metabolic Panel: Recent Labs  Lab 04/11/20 0402 04/12/20 0434 04/13/20 1113 04/13/20 1113 04/13/20 1829 04/14/20 0511 04/14/20 1137  04/14/20 1944 04/15/20 0452 04/16/20 0740  NA 145 143 148*  --   --   --  143  --   --  139  K 4.4 4.4 4.5  --   --   --  3.8  --   --  3.5  CL 117* 116* 119*  --   --   --  110  --   --  103  CO2 11* 13* 12*  --   --   --  21*  --   --  25  GLUCOSE 94 88 68*  --   --   --  107*  --   --  106*  BUN 78* 86* 94*  --   --   --  55*  --   --  48*  CREATININE 9.99* 10.66* 11.51*  --   --   --  7.74*  --   --  6.56*  CALCIUM 7.6* 7.5* 8.1*  --   --   --  8.0*  --   --  7.7*  MG  --   --   --   --  1.6* 1.5*  --  1.6* 2.2  --   PHOS  --   --  7.6*   < > 8.3* 4.4  --  2.5 3.4 3.5   < > = values in this interval not displayed.   GFR: CrCl cannot be calculated (Unknown ideal weight.). Liver Function Tests: Recent Labs  Lab 04/10/20 2037 04/13/20 1113 04/14/20 1137 04/16/20 0740  AST 13*  --  19  --   ALT 10  --  8  --   ALKPHOS 95  --  79  --   BILITOT 0.5  --  0.4  --   PROT 6.6  --  6.1*  --   ALBUMIN 2.8* 2.3* 2.3* 2.1*   No results for input(s): LIPASE, AMYLASE in the last 168 hours. No results for input(s): AMMONIA in the last 168 hours. Coagulation Profile: Recent Labs  Lab 04/13/20 1113  INR 1.1   Cardiac Enzymes: No results for input(s): CKTOTAL, CKMB, CKMBINDEX, TROPONINI in the last 168 hours. BNP (last 3 results) Recent Labs    06/22/19 1505  PROBNP 170.0*   HbA1C: No results for input(s): HGBA1C in the last 72 hours. CBG: Recent Labs  Lab 04/16/20 0014 04/16/20 0415 04/16/20 0809 04/16/20 1216 04/16/20 1617  GLUCAP 102* 109* 100* 122* 73   Lipid Profile: No results for input(s): CHOL, HDL, LDLCALC, TRIG, CHOLHDL, LDLDIRECT in the last 72 hours. Thyroid Function Tests: No results for input(s): TSH, T4TOTAL,  FREET4, T3FREE, THYROIDAB in the last 72 hours. Anemia Panel: No results for input(s): VITAMINB12, FOLATE, FERRITIN, TIBC, IRON, RETICCTPCT in the last 72 hours. Urine analysis:    Component Value Date/Time   COLORURINE STRAW (A) 04/11/2020  0223   APPEARANCEUR CLEAR 04/11/2020 0223   LABSPEC 1.011 04/11/2020 0223   PHURINE 5.0 04/11/2020 0223   GLUCOSEU 50 (A) 04/11/2020 0223   HGBUR SMALL (A) 04/11/2020 0223   BILIRUBINUR NEGATIVE 04/11/2020 0223   KETONESUR NEGATIVE 04/11/2020 0223   PROTEINUR >=300 (A) 04/11/2020 0223   UROBILINOGEN 0.2 11/29/2014 2003   NITRITE NEGATIVE 04/11/2020 0223   LEUKOCYTESUR NEGATIVE 04/11/2020 0223   Sepsis Labs: @LABRCNTIP (procalcitonin:4,lacticidven:4)  ) Recent Results (from the past 240 hour(s))  SARS Coronavirus 2 by RT PCR (hospital order, performed in [facility] hospital lab) Nasopharyngeal Nasopharyngeal Swab     Status: None   Collection Time: 04/11/20  3:58 AM   Specimen: Nasopharyngeal Swab  Result Value Ref Range Status   SARS Coronavirus 2 NEGATIVE NEGATIVE Final    Comment: (NOTE) SARS-CoV-2 target nucleic acids are NOT DETECTED.  The SARS-CoV-2 RNA is generally detectable in upper and lower respiratory specimens during the acute phase of infection. The lowest concentration of SARS-CoV-2 viral copies this assay can detect is 250 copies / mL. A negative result does not preclude SARS-CoV-2 infection and should not be used as the sole basis for treatment or other patient management decisions.  A negative result may occur with improper specimen collection / handling, submission of specimen other than nasopharyngeal swab, presence of viral mutation(s) within the areas targeted by this assay, and inadequate number of viral copies (<250 copies / mL). A negative result must be combined with clinical observations, patient history, and epidemiological information.  Fact Sheet for Patients:   StrictlyIdeas.no  Fact Sheet for Healthcare Providers: BankingDealers.co.za  This test is not yet approved or  cleared by the [place] FDA and has been authorized for detection and/or diagnosis of SARS-CoV-2 by FDA under an Emergency  Use Authorization (EUA).  This EUA will remain in effect (meaning this test can be used) for the duration of the COVID-19 declaration under Section 564(b)(1) of the Act, 21 U.S.C. section 360bbb-3(b)(1), unless the authorization is terminated or revoked sooner.  Performed at [facility], [place] [address] [place], [place] [address]   Culture, blood (routine x 2)     Status: None (Preliminary result)   Collection Time: 04/14/20 11:31 AM   Specimen: BLOOD RIGHT HAND  Result Value Ref Range Status   Specimen Description BLOOD RIGHT HAND  Final   Special Requests   Final    BOTTLES DRAWN AEROBIC ONLY Blood Culture results may not be optimal due to an inadequate volume of blood received in culture bottles   Culture   Final    NO GROWTH 2 DAYS Performed at [facility], [place] [address] [place], [place] [address]    Report Status PENDING  Incomplete  Culture, blood (routine x 2)     Status: None (Preliminary result)   Collection Time: 04/14/20 11:36 AM   Specimen: BLOOD RIGHT HAND  Result Value Ref Range Status   Specimen Description BLOOD RIGHT HAND  Final   Special Requests   Final    BOTTLES DRAWN AEROBIC ONLY Blood Culture results may not be optimal due to an inadequate volume of blood received in culture bottles   Culture   Final    NO GROWTH 2 DAYS Performed at [facility], [place] [address], [place], [place] [address]    Report Status PENDING  Incomplete  CSF culture     Status: None (Preliminary result)   Collection Time: 04/14/20  6:27 PM   Specimen: PATH Cytology CSF; Cerebrospinal Fluid  Result Value Ref Range Status   Specimen Description CSF  Final   Special Requests NONE  Final   Gram Stain NO WBC SEEN NO ORGANISMS SEEN CYTOSPIN SMEAR   Final   Culture   Final    NO GROWTH 2 DAYS Performed at [facility], [place] [address], [place], [place] [address]    Report Status PENDING  Incomplete  Fungus Stain     Status: None   Collection Time:  04/14/20  6:27 PM   Specimen: PATH Cytology CSF; Cerebrospinal Fluid  Result Value Ref Range Status   FUNGUS STAIN Final report  Final    Comment: (NOTE) Performed At: [facility] [address] [place], [place] [id number] Rush Farmer MD BS:[id number]    Fungal Source CSF  Final    Comment: Performed at [facility], [place] [address] [place], [place] [address]  Fungal Stain reflex     Status: None   Collection Time: 04/14/20  6:27 PM  Result Value Ref Range Status   Fungal stain result 1 Comment  Final    Comment: (NOTE) KOH/Calcofluor preparation:  no fungus observed. Performed At: [facility] [place], [place] [id number] Rush Farmer MD TJ:[id number]       Radiology Studies: DG FLUORO GUIDE LUMBAR PUNCTURE  Result Date: 04/14/2020 CLINICAL DATA:  64 year old male with history of altered mental status. 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 History of HIV. EXAM: DIAGNOSTIC LUMBAR PUNCTURE UNDER FLUOROSCOPIC GUIDANCE FLUOROSCOPY TIME:  Fluoroscopy Time:  1.2 minutes Radiation Exposure Index (if provided by the fluoroscopic device): 7.8 mGy PROCEDURE: Informed consent was obtained from the patient prior to the procedure, including potential complications of headache, allergy, and pain. With the patient prone, the lower back was prepped with Betadine. 1% Lidocaine was used for local anesthesia. Lumbar puncture was performed at the L2-L3 level using a 22 gauge needle with return of clear CSF with an opening pressure of 18 cm water. 32 ml of CSF were obtained for laboratory studies. Closing pressure was not measurable. The patient tolerated the procedure well and there were no apparent complications. IMPRESSION: 1. Successful uncomplicated fluoroscopic guided lumbar puncture, as above. Electronically Signed   By: Vinnie Langton M.D.   On: 04/14/2020 18:46     Scheduled Meds: . carvedilol  3.125 mg Per Tube BID WC  . Chlorhexidine Gluconate Cloth  6  each Topical Q0600  . clopidogrel  75 mg Per Tube Daily  . darbepoetin (ARANESP) injection - DIALYSIS  40 mcg Intravenous Q Fri-HD  . dolutegravir  50 mg Oral Daily  . feeding supplement (NEPRO CARB STEADY)  1,000 mL Per Tube Q24H  . feeding supplement (PROSource TF)  45 mL Per Tube Daily  . heparin injection (subcutaneous)  5,000 Units Subcutaneous Q8H  . insulin aspart  0-9 Units Subcutaneous Q4H  . lamiVUDine  300 mg Per Tube Daily  . levETIRAcetam  500 mg Oral Q12H  . LORazepam  2 mg Intravenous Once  . pantoprazole sodium  40 mg Per Tube Daily  . pravastatin  80 mg Per Tube QHS  . sulfamethoxazole-trimethoprim  1 tablet Oral Once per day on Mon Wed Fri  . tamsulosin  0.4 mg Oral Daily   Continuous  Infusions: . levETIRAcetam 500 mg (04/16/20 1213)     LOS: 6 days   Time Spent in minutes   45 minutes  Avionna Bower D.O. on 04/16/2020 at 4:34 PM  Between 7am to 7pm - Please see pager noted on [email or web address]  After 7pm go to [email or web address]  And look for the night coverage person covering for me after hours  [facility] Office  [phone]

## 2020-04-16 NOTE — Evaluation (Signed)
Physical Therapy Evaluation Patient Details Name: Richard [name] MRN: [id number] DOB: 01/06/1956 Today's Date: 04/16/2020   History of Present Illness  64 year old male with medical history of Arthritis, Diabetes mellitus, type II, insulin dependent, GERD, HTN, Immune deficiency disorder, migraines, neuropathy in diabetes, ruptured lumbar disc, ESRD stage V , GFR < 15 ml/min, declined  Dialysis in the past, CVA with residual right sided weakness, seizures admitted 7/20 through ED with seizure activity lasting 4 minutes per roommate, and encephalopathy. MRI of brain showed 2 mm infarct in left parietal lobe. A tunneled dialysis catheter was placed and  Dialysis was started on 7/23; New diagnosis this admission of HIV/AIDS   Clinical Impression  Pt admitted with above diagnosis. At the time of PT eval pt was very lethargic and responding only minimally to therapist. He attempted to open his eyes and told me his name when I asked, however all other speech was unintelligible. Pt following simple 1-step commands for strength and ROM testing; otherwise not participatory. Based on performance today, recommend SNF level follow-up at d/c. Will continue to follow and adjust recommendations as needed. Pt currently with functional limitations due to the deficits listed below (see PT Problem List). Pt will benefit from skilled PT to increase their independence and safety with mobility to allow discharge to the venue listed below.       Follow Up Recommendations SNF;Supervision/Assistance - 24 hour    Equipment Recommendations  None recommended by PT    Recommendations for Other Services       Precautions / Restrictions Precautions Precautions: Fall Precaution Comments: Pt with NG tube Restrictions Weight Bearing Restrictions: No      Mobility  Bed Mobility Overal bed mobility: Needs Assistance Bed Mobility: Rolling Rolling: Max assist;+2 for physical assistance         General bed  mobility comments: Unable to assess as pt was very lethargic and not participatory with evaluation.   Transfers                 General transfer comment: Unable to assess this session  Ambulation/Gait                Stairs            Wheelchair Mobility    Modified Rankin (Stroke Patients Only)       Balance                                             Pertinent Vitals/Pain Pain Assessment: Faces Faces Pain Scale: Hurts even more Pain Location: RLE Pain Descriptors / Indicators: Discomfort;Grimacing;Guarding Pain Intervention(s): Limited activity within patient's tolerance;Monitored during session;Repositioned    Home Living Family/patient expects to be discharged to:: Private residence Living Arrangements: Other (Comment);Non-relatives/Friends Available Help at Discharge: Available 24 hours/day;Friend(s) Type of Home: House Home Access: Level entry     Home Layout: One level Home Equipment: Wheelchair - [employer] - 2 wheels;Cane - single point Additional Comments: Info taken from OT eval    Prior Function Level of Independence: Needs assistance   Gait / Transfers Assistance Needed: SPC for mobility  ADL's / Homemaking Assistance Needed: Assist from roommate with ADLs  Comments: pt reports that he uses a cane most of the time and that he drives even though he's not supposed to --info taken from OT eval     Hand Dominance  Dominant Hand: Left    Extremity/Trunk Assessment   Upper Extremity Assessment Upper Extremity Assessment: RUE deficits/detail;LUE deficits/detail RUE Deficits / Details: RUE rigid and painful to move 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 2+/5 elbow flex/ext, and 2+/5 shoulder flex/ext LUE Sensation: decreased proprioception LUE  Coordination: decreased fine motor;decreased gross motor    Lower Extremity Assessment Lower Extremity Assessment: RLE deficits/detail;LLE deficits/detail RLE Deficits / Details: Pt appears painful to flex RLE (passively) but is able to actively extend hip and knee against therapist. LLE Deficits / Details: No pain noted with passive flexion of the LLE - pt able to actively extend hip and knee against therapist.     Cervical / Trunk Assessment Cervical / Trunk Assessment: Other exceptions Cervical / Trunk Exceptions: Noted scabbed areas on the chest. Not able to assess much as pt was supine and not very participatory.   Communication   Communication: Expressive difficulties  Cognition Arousal/Alertness: Lethargic Behavior During Therapy: Flat affect Overall Cognitive Status: Difficult to assess                                 General Comments: Pt very lethargic with slow processing for answering questions and following commands      General Comments General comments (skin integrity, edema, etc.): Pt lethargy after HD treatment and needing max stimulation to maintain alertness    Exercises     Assessment/Plan    PT Assessment Patient needs continued PT services  PT Problem List Decreased range of motion;Decreased strength;Decreased activity tolerance;Decreased balance;Decreased mobility;Decreased cognition;Decreased safety awareness;Decreased knowledge of use of DME;Decreased knowledge of precautions;Impaired tone;Pain       PT Treatment Interventions DME instruction;Gait training;Functional mobility training;Therapeutic activities;Therapeutic exercise;Neuromuscular re-education;Patient/family education;Cognitive remediation    PT Goals (Current goals can be found in the Care Plan section)  Acute Rehab PT Goals Patient Stated Goal: none stated PT Goal Formulation: Patient unable to participate in goal setting Time For Goal Achievement: 04/30/20 Potential to  Achieve Goals: Fair    Frequency Min 3X/week   Barriers to discharge        Co-evaluation               AM-PAC PT "6 Clicks" Mobility  Outcome Measure Help needed turning from your back to your side while in a flat bed without using bedrails?: Total Help needed moving from lying on your back to sitting on the side of a flat bed without using bedrails?: Total Help needed moving to and from a bed to a chair (including a wheelchair)?: Total Help needed standing up from a chair using your arms (e.g., wheelchair or bedside chair)?: Total Help needed to walk in hospital room?: Total Help needed climbing 3-5 steps with a railing? : Total 6 Click Score: 6    End of Session   Activity Tolerance: Patient limited by lethargy Patient left: in bed;with call bell/phone within reach;with bed alarm set Nurse Communication: Mobility status PT Visit Diagnosis: Muscle weakness (generalized) (M62.81);Difficulty in walking, not elsewhere classified (R26.2)    Time: 1884-1660 PT Time Calculation (min) (ACUTE ONLY): 14 min   Charges:   PT Evaluation $PT Eval Moderate Complexity: 1 Mod          Rolinda Roan, PT, DPT Acute Rehabilitation Services Pager: [phone] Office: [phone]  [name] 04/16/2020, 4:24 PM

## 2020-04-16 NOTE — Progress Notes (Addendum)
Subjective:No seizures overnight. More awake, requesting to have food.   ROS: negative except above  Examination  Vital signs in last 24 hours: Temp:  [97.6 F (36.4 C)-98.4 F (36.9 C)] 98.2 F (36.8 C) (07/26 1215) Pulse Rate:  [80-104] 98 (07/26 1115) Resp:  [14-20] 20 (07/26 1115) BP: (113-199)/(56-106) 155/82 (07/26 1215) SpO2:  [96 %-100 %] 100 % (07/26 1110) Weight:  [65.8 kg-70.7 kg] 65.8 kg (07/26 1110)  General: lying in bed, NAD CVS: pulse-normal rate and rhythm RS: breathing comfortably, CTAB Extremities: wamr, RUE contracture  Neuro: MS: Alert, oriented to self and place, follows simple one step commands CN: pupils equal and reactive,  EOMI, face symmetric, tongue midline, left sided visual neglect, Motor: 5/5 in LUE and LLE, 2/5 in RUE with contracture and 3/5 in RLE   Basic Metabolic Panel: Recent Labs  Lab 04/11/20 0402 04/11/20 0402 04/12/20 0434 04/12/20 0434 04/13/20 1113 04/13/20 1113 04/13/20 1829 04/14/20 0511 04/14/20 1137 04/14/20 1944 04/15/20 0452 04/16/20 0740  NA 145  --  143  --  148*  --   --   --  143  --   --  139  K 4.4  --  4.4  --  4.5  --   --   --  3.8  --   --  3.5  CL 117*  --  116*  --  119*  --   --   --  110  --   --  103  CO2 11*  --  13*  --  12*  --   --   --  21*  --   --  25  GLUCOSE 94  --  88  --  68*  --   --   --  107*  --   --  106*  BUN 78*  --  86*  --  94*  --   --   --  55*  --   --  48*  CREATININE 9.99*  --  10.66*  --  11.51*  --   --   --  7.74*  --   --  6.56*  CALCIUM 7.6*   < > 7.5*   < > 8.1*  --   --   --  8.0*  --   --  7.7*  MG  --   --   --   --   --   --  1.6* 1.5*  --  1.6* 2.2  --   PHOS  --   --   --   --  7.6*   < > 8.3* 4.4  --  2.5 3.4 3.5   < > = values in this interval not displayed.    CBC: Recent Labs  Lab 04/10/20 2037 04/10/20 2037 04/11/20 0018 04/11/20 0402 04/12/20 0434 04/14/20 1137 04/16/20 0740  WBC 6.6   < > 6.8 9.1 6.5 4.9 4.4  NEUTROABS 4.7  --   --   --  5.0  3.6  --   HGB 10.3*   < > 11.4* 9.7* 8.2* 9.2* 8.4*  HCT 31.5*   < > 34.5* 29.4* 24.5* 27.5* 26.4*  MCV 90.0   < > 87.6 89.4 88.1 87.0 91.0  PLT 192   < > 201 195 173 180 155   < > = values in this interval not displayed.     Coagulation Studies: No results for input(s): LABPROT, INR in the last 72 hours.  Imaging [name] 04/12/2020:  2 mm acute or early subacute infarct within the left parietal lobe periatrial white matter. Stable, mild generalized parenchymal atrophy. Stable moderate to advanced chronic small vessel ischemic disease. Redemonstrated chronic small-vessel infarcts within the left corona radiata/basal ganglia, right basal ganglia, thalami and left cerebellum. 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 Pansinusitis. Trace left mastoid effusion. Nonspecific 7 mm cystic appearing lesion along the left external ear. Direct visualization recommended.  ASSESSMENT AND PLAN: Mr Chew a 64 y.o.malewith PMH significant for arthritis, Diabetes mellitus, type II, insulin dependent , GERD, Hypertension, Immune deficiency disorder, Migraines, Neuropathy in diabetes, Ruptured lumbar disc,ESRD declined to go on dialysisand Strokewith residual R sided weakness c/b Seizures and non compliance with Keppra who was admitted with breakthrough seizures in the setting of non compliance of Keppra. Continues EEG for about 48 hours with no seizures.  MRI brain with a tiny 2 mm infarct in the left parietal lobe which does not explain his presentation.   Suspected breakthrough seizure Acute left parietal lobe infarct Encephalopathy ( improving) New diagnosis of HIV and AIDS - breakthrough seizure in setting on non compliance with keppra. No seizures since admission - Encephalopathy likely secondary to seizure, medications, HIV - LP didn't show any pleocytosis, ID on board - No tpa due to outside  window.   Recommendations - Continue keppra 500g BID  - Continue plavix 75mg  for sec stroke prevention - Continue management of rest of comorbidities per primary team - PRN IV ativan for clinical seizure - F/u with neurology in 8-12 weeks after discharge for stroke and seizure  - seizure precautions  Seizure precautions: Per [facility] statutes, patients with seizures are not allowed to drive until they have been seizure-free for six months and cleared by a physician    Use caution when using heavy equipment or power tools. Avoid working on ladders or at heights. Take showers instead of baths. Ensure the water temperature is not too high on the home water heater. Do not go swimming alone. Do not lock yourself in a room alone (i.e. bathroom). When caring for infants or small children, sit down when holding, feeding, or changing them to minimize risk of injury to the child in the event you have a seizure. Maintain good sleep hygiene. Avoid alcohol.    If patient has another seizure, call 911 and bring them back to the ED if: A.  The seizure lasts longer than 5 minutes.      B.  The patient doesn't wake shortly after the seizure or has new problems such as difficulty seeing, speaking or moving following the seizure C.  The patient was injured during the seizure D.  The patient has a temperature over 102 F (39C) E.  The patient vomited during the seizure and now is having trouble breathing    During the Seizure   - First, ensure adequate ventilation and place patients on the floor on their left side  Loosen clothing around the neck and ensure the airway is patent. If the patient is clenching the teeth, do not force the mouth open with any object as this can cause severe damage - Remove all items from the surrounding that can be hazardous. The patient may be oblivious to what's happening and may not even know what he or she is doing. If the patient is confused and wandering, either  gently guide him/her away and block access to outside areas - Reassure the individual and be comforting - Call 911. In  most cases, the seizure ends before EMS arrives. However, there are cases when seizures may last over 3 to 5 minutes. Or the individual may have developed breathing difficulties or severe injuries. If a pregnant patient or a person with diabetes develops a seizure, it is prudent to call an ambulance. - Finally, if the patient does not regain full consciousness, then call EMS. Most patients will remain confused for about 45 to 90 minutes after a seizure, so you must use judgment in calling for help. - Avoid restraints but make sure the patient is in a bed with padded side rails - Place the individual in a lateral position with the neck slightly flexed; this will help the saliva drain from the mouth and prevent the tongue from falling backward - Remove all nearby furniture and other hazards from the area - Provide verbal assurance as the individual is regaining consciousness - Provide the patient with privacy if possible - Call for help and start treatment as ordered by the caregiver    After the Seizure (Postictal Stage)   After a seizure, most patients experience confusion, fatigue, muscle pain and/or a headache. Thus, one should permit the individual to sleep. For the next few days, reassurance is essential. Being calm and helping reorient the person is also of importance.   Most seizures are painless and end spontaneously. Seizures are not harmful to others but can lead to complications such as stress on the lungs, brain and the heart. Individuals with prior lung problems may develop labored breathing and respiratory distress.   I have spent a total of 25  minutes with the patient reviewing hospital notes,  test results, labs and examining the patient as well as establishing an assessment and plan that was discussed personally with the patient.  > 50% of time was spent in direct  patient care    [facility] For questions after 5pm please refer to AMION to reach the Neurologist on call

## 2020-04-16 NOTE — Progress Notes (Signed)
PT Cancellation Note  Patient Details Name: Richard [name] MRN: [id number] DOB: 03-07-1956   Cancelled Treatment:    Reason Eval/Treat Not Completed: Patient currently off unit for HD. Will continue to follow and initiate PT evaluation when able.   [name] 04/16/2020, 8:08 AM   Rolinda Roan, PT, DPT Acute Rehabilitation Services Pager: [phone] Office: [phone]

## 2020-04-16 NOTE — Progress Notes (Signed)
Renal Navigator aware of patient's need for OP HD referral for ESRD, however, notes in Palliative Care note that patient's daughter [name] states that she knows patient cannot discharge home following this hospitalization. PT/OT following, but have not yet been able to work with patient due to functional ability and various procedures. Navigator spoke with CSW/L. [place].  Renal Navigator awaiting disposition plan in order to refer to appropriate OP HD clinic.   Alphonzo Cruise, [place] Renal Navigator [phone]

## 2020-04-16 NOTE — Progress Notes (Signed)
Subjective:  He was more interactive today  Antibiotics:  Anti-infectives (From admission, onward)   Start     Dose/Rate Route Frequency Ordered Stop   04/16/20 1000  lamiVUDine (EPIVIR) tablet 300 mg     Discontinue    Note to Pharmacy: I DO NOT renally dose lamivudine and I have never had problems NOT adjusting it. I want pt to be on Dovato but still not on formulary here (unfortunately)   300 mg Per Tube Daily 04/15/20 1307     04/16/20 0900  sulfamethoxazole-trimethoprim (BACTRIM DS) 800-160 MG per tablet 1 tablet     Discontinue     1 tablet Oral Once per day on Mon Wed Fri 04/15/20 1350     04/15/20 1245  dolutegravir (TIVICAY) tablet 50 mg     Discontinue     50 mg Oral Daily 04/15/20 1202     04/15/20 1245  lamiVUDine (EPIVIR) tablet 300 mg  Status:  Discontinued       Note to Pharmacy: I DO NOT renally dose lamivudine and I have never had problems NOT adjusting it. I want pt to be on Dovato but still not on formulary here (unfortunately)   300 mg Oral Daily 04/15/20 1202 04/15/20 1307   04/13/20 1737  ceFAZolin (ANCEF) 2-4 GM/100ML-% IVPB       Note to Pharmacy: Desiree Hane   : cabinet override      04/13/20 1737 04/14/20 0544   04/13/20 0000  ceFAZolin (ANCEF) IVPB 2g/100 mL premix        2 g 200 mL/hr over 30 Minutes Intravenous To Radiology 04/12/20 1454 04/13/20 0834      Medications: Scheduled Meds: . carvedilol  3.125 mg Per Tube BID WC  . Chlorhexidine Gluconate Cloth  6 each Topical Q0600  . clopidogrel  75 mg Per Tube Daily  . darbepoetin (ARANESP) injection - DIALYSIS  40 mcg Intravenous Q Fri-HD  . dolutegravir  50 mg Oral Daily  . feeding supplement (NEPRO CARB STEADY)  1,000 mL Per Tube Q24H  . feeding supplement (PROSource TF)  45 mL Per Tube Daily  . heparin injection (subcutaneous)  5,000 Units Subcutaneous Q8H  . insulin aspart  0-9 Units Subcutaneous Q4H  . lamiVUDine  300 mg Per Tube Daily  . levETIRAcetam  500 mg Oral Q12H  .  LORazepam  2 mg Intravenous Once  . pantoprazole sodium  40 mg Per Tube Daily  . pravastatin  80 mg Per Tube QHS  . sulfamethoxazole-trimethoprim  1 tablet Oral Once per day on Mon Wed Fri  . tamsulosin  0.4 mg Oral Daily   Continuous Infusions: . levETIRAcetam Stopped (04/16/20 0827)   PRN Meds:.hydrALAZINE, labetalol    Objective: Weight change: 3.1 kg  Intake/Output Summary (Last 24 hours) at 04/16/2020 1206 Last data filed at 04/16/2020 1110 Gross per 24 hour  Intake --  Output 2300 ml  Net -2300 ml   Blood pressure (!) 132/72, pulse 98, temperature 97.6 F (36.4 C), temperature source Axillary, resp. rate 20, weight 65.8 kg, SpO2 100 %. Temp:  [97.6 F (36.4 C)-98.4 F (36.9 C)] 97.6 F (36.4 C) (07/26 1115) Pulse Rate:  [80-104] 98 (07/26 1115) Resp:  [14-20] 20 (07/26 1115) BP: (113-199)/(56-106) 132/72 (07/26 1115) SpO2:  [94 %-100 %] 100 % (07/26 1110) Weight:  [65.8 kg-70.7 kg] 65.8 kg (07/26 1110)  Physical Exam: General: Appeared to be watching television could answer some questions thinks he is in the  ER HEENT: anicteric sclera, EOMI CVS tachycardic no murmurs gallops rubs l  Chest: , no wheezing, no respiratory distress lungs clear Abdomen: soft non-distended,  Extremities: Right upper extremity with contracture Skin: Diffuse rash that is concerning for possible disseminated cryptococcal infection Neuro: Right upper extremity with contracture but able to move his hands also wiggle toes  CBC:    BMET Recent Labs    04/14/20 1137 04/16/20 0740  NA 143 139  K 3.8 3.5  CL 110 103  CO2 21* 25  GLUCOSE 107* 106*  BUN 55* 48*  CREATININE 7.74* 6.56*  CALCIUM 8.0* 7.7*     Liver Panel  Recent Labs    04/14/20 1137 04/16/20 0740  PROT 6.1*  --   ALBUMIN 2.3* 2.1*  AST 19  --   ALT 8  --   ALKPHOS 79  --   BILITOT 0.4  --        Sedimentation Rate No results for input(s): ESRSEDRATE in the last 72 hours. C-Reactive Protein No  results for input(s): CRP in the last 72 hours.  Micro Results: Recent Results (from the past 720 hour(s))  SARS Coronavirus 2 by RT PCR (hospital order, performed in [facility] hospital lab) Nasopharyngeal Nasopharyngeal Swab     Status: None   Collection Time: 04/11/20  3:58 AM   Specimen: Nasopharyngeal Swab  Result Value Ref Range Status   SARS Coronavirus 2 NEGATIVE NEGATIVE Final    Comment: (NOTE) SARS-CoV-2 target nucleic acids are NOT DETECTED.  The SARS-CoV-2 RNA is generally detectable in upper and lower respiratory specimens during the acute phase of infection. The lowest concentration of SARS-CoV-2 viral copies this assay can detect is 250 copies / mL. A negative result does not preclude SARS-CoV-2 infection and should not be used as the sole basis for treatment or other patient management decisions.  A negative result may occur with improper specimen collection / handling, submission of specimen other than nasopharyngeal swab, presence of viral mutation(s) within the areas targeted by this assay, and inadequate number of viral copies (<250 copies / mL). A negative result must be combined with clinical observations, patient history, and epidemiological information.  Fact Sheet for Patients:   StrictlyIdeas.no  Fact Sheet for Healthcare Providers: BankingDealers.co.za  This test is not yet approved or  cleared by the [place] FDA and has been authorized for detection and/or diagnosis of SARS-CoV-2 by FDA under an Emergency Use Authorization (EUA).  This EUA will remain in effect (meaning this test can be used) for the duration of the COVID-19 declaration under Section 564(b)(1) of the Act, 21 U.S.C. section 360bbb-3(b)(1), unless the authorization is terminated or revoked sooner.  Performed at [facility], [place] [address] [place], [place] [address]   Culture, blood (routine x 2)     Status: None  (Preliminary result)   Collection Time: 04/14/20 11:31 AM   Specimen: BLOOD RIGHT HAND  Result Value Ref Range Status   Specimen Description BLOOD RIGHT HAND  Final   Special Requests   Final    BOTTLES DRAWN AEROBIC ONLY Blood Culture results may not be optimal due to an inadequate volume of blood received in culture bottles   Culture   Final    NO GROWTH 2 DAYS Performed at [facility], [place] [address], [place], [place] [address]    Report Status PENDING  Incomplete  Culture, blood (routine x 2)     Status: None (Preliminary result)   Collection Time:  04/14/20 11:36 AM   Specimen: BLOOD RIGHT HAND  Result Value Ref Range Status   Specimen Description BLOOD RIGHT HAND  Final   Special Requests   Final    BOTTLES DRAWN AEROBIC ONLY Blood Culture results may not be optimal due to an inadequate volume of blood received in culture bottles   Culture   Final    NO GROWTH 2 DAYS Performed at [facility], [place] [address] [place], [place] [address]    Report Status PENDING  Incomplete  CSF culture     Status: None (Preliminary result)   Collection Time: 04/14/20  6:27 PM   Specimen: PATH Cytology CSF; Cerebrospinal Fluid  Result Value Ref Range Status   Specimen Description CSF  Final   Special Requests NONE  Final   Gram Stain NO WBC SEEN NO ORGANISMS SEEN CYTOSPIN SMEAR   Final   Culture   Final    NO GROWTH 2 DAYS Performed at [facility], [place] [address], [place], [place] [address]    Report Status PENDING  Incomplete    Studies/Results: DG FLUORO GUIDE LUMBAR PUNCTURE  Result Date: 04/14/2020 CLINICAL DATA:  64 year old male with history of altered mental status. 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 History of HIV. EXAM: DIAGNOSTIC LUMBAR PUNCTURE UNDER FLUOROSCOPIC GUIDANCE FLUOROSCOPY TIME:  Fluoroscopy Time:  1.2 minutes Radiation Exposure Index (if provided by the fluoroscopic device): 7.8 mGy PROCEDURE: Informed consent was obtained from the patient prior to  the procedure, including potential complications of headache, allergy, and pain. With the patient prone, the lower back was prepped with Betadine. 1% Lidocaine was used for local anesthesia. Lumbar puncture was performed at the L2-L3 level using a 22 gauge needle with return of clear CSF with an opening pressure of 18 cm water. 32 ml of CSF were obtained for laboratory studies. Closing pressure was not measurable. The patient tolerated the procedure well and there were no apparent complications. IMPRESSION: 1. Successful uncomplicated fluoroscopic guided lumbar puncture, as above. Electronically Signed   By: Vinnie Langton M.D.   On: 04/14/2020 18:46      Assessment/Plan:  INTERVAL HISTORY:  Antiviral started yesterday he is now on hemodialysis  Principal Problem:   Encephalopathy Active Problems:   Tobacco abuse   Protein-calorie malnutrition, mild (HCC)   Hyperlipidemia   Neuropathy in diabetes ([place])   GERD (gastroesophageal reflux disease)   Diabetes mellitus, type II, insulin dependent (HCC)   Altered mental status   CKD (chronic kidney disease) stage 5, GFR less than 15 ml/min (HCC)   History of cerebrovascular accident (CVA) with residual deficit   Seizure ([place])   AIDS (acquired immune deficiency syndrome) ([place])   ESRD (end stage renal disease) ([place])   Creatinine elevation   Goals of care, counseling/discussion   Hypertensive urgency   Meningoencephalitis   Palliative care by specialist    Richard [name] is a 64 y.o. male with past medical history significant for poorly controlled diabetes and hypertension with progression of his chronic kidney disease, prior stroke who was admitted with seizures and encephalopathy and found to have a parietal infarct.  He also has been found to have HIV and AIDS with a CD4 count of 39.  He has a diffuse rash that is concerning for possible disseminated cryptococcal infection versus histoplasma Blastomyces  #1  Central nervous  system infarct with encephalopathy: I was concerned that he could have disseminated cryptococcal infection cryptococcal meningitis.  Fortunately had a normal opening pressure and a negative cryptococcal  antigen in CSF.  His CSF profile is also normal although it can be normal in people with HIV AIDS and cryptococcal meningitis.  He does not appear to have any evidence of CNS infection which is a relief  Greatly appreciate interventional radiology to providing LP and neurology attempting 1 However he clearly needs a lumbar puncture and neurology has graciously agreed to attempt this.   2.  HIV and AIDS: Now that we know he does not have a CNS opportunistic infection I will initiate antiretroviral therapy  I had wanted to use Dovato as a single tablet regimen for him (note I do not renally adjust lamivudine and I found that there are no no problems in terms of tolerability or toxicity in dialysis patients to receive full dose lamivudine.  )  We do not have Dovato on formulary yet so will be given in its component parts.  Note his tube feeds will need to be held before and after getting his antivirals as the calcium and magnesium can chelate the TIVICAY  We will start Bactrim 3 times weekly  3.  Chronic kidney disease that is progressed to end-stage renal disease.  He is to go undergo dialysis   #4 goals of care: Currently receiving aggressive care.  Certainly his HIV is easily treatable and the least difficult to treat of his problems.       LOS: 6 days   Alcide Evener 04/16/2020, 12:06 PM

## 2020-04-16 NOTE — Evaluation (Signed)
Occupational Therapy Evaluation Patient Details Name: Richard [name] MRN: [id number] DOB: 1956-06-05 Today's Date: 04/16/2020    History of Present Illness 64 year old male with medical history of Arthritis, Diabetes mellitus, type II, insulin dependent, GERD, HTN, Immune deficiency disorder, migraines, neuropathy in diabetes, ruptured lumbar disc, ESRD stage V , GFR < 15 ml/min, declined  Dialysis in the past, CVA with residual right sided weakness, seizures admitted 7/20 through ED with seizure activity lasting 4 minutes per roommate, and encephalopathy. MRI of brain showed 2 mm infarct in left parietal lobe. A tunneled dialysis catheter was placed and  Dialysis was started on 7/23; New diagnosis this admission of HIV/AIDS    Clinical Impression   PTA pt reports using [facility] for mobility and getting help from his roommate with ADLs and IADLs. Pt was admitted for above and treated for problem list below (see OT Problem List). Limited eval due to pt's increased lethargy after HD treatment. Pt is A&Ox2 disoriented to time and situation. Requires mulitmodal cues and increased time for one step commands. Requires Max A - Total A +2 with multimodal cues for sequencing during ADLs due to increased lethargy, weakness, decreased ROM and impaired functional UE use. Requires Max A +2 for bed mobility due to weakness and lethargy. Vision, cognition, and balance to be further assessed next session. Believe pt would benefit from skilled OT services acutely and at the SNF level to increase participation in BADLs.    Follow Up Recommendations  SNF;Supervision/Assistance - 24 hour    Equipment Recommendations  3 in 1 bedside commode       Precautions / Restrictions Precautions Precautions: Fall Precaution Comments: Pt with NG tube Restrictions Weight Bearing Restrictions: No      Mobility Bed Mobility Overal bed mobility: Needs Assistance Bed Mobility: Rolling Rolling: Max assist;+2 for physical  assistance         General bed mobility comments: Max A +2 for rolling after bed was soiled  Transfers                 General transfer comment: deferred due to pt safety        ADL either performed or assessed with clinical judgement   ADL Overall ADL's : Needs assistance/impaired Eating/Feeding: NPO Eating/Feeding Details (indicate cue type and reason): Pt on NG tube Grooming: Wash/dry face;Bed level;Maximal assistance Grooming Details (indicate cue type and reason): Pt with Max A to assist in ROM to get washcloth to face Upper Body Bathing: Bed level;Total assistance Upper Body Bathing Details (indicate cue type and reason): Total A due to weakness and deficits in BUEs Lower Body Bathing: Total assistance;+2 for physical assistance;Bed level Lower Body Bathing Details (indicate cue type and reason): Total A for bed mobility with perineal care Upper Body Dressing : Bed level;Maximal assistance Upper Body Dressing Details (indicate cue type and reason): Max A due to decreased functional use of BUEs Lower Body Dressing: Total assistance;+2 for physical assistance;Bed level Lower Body Dressing Details (indicate cue type and reason): Total A +2 for bed mobility and generalized weakness   Toilet Transfer Details (indicate cue type and reason): deferred due to pt safety Toileting- Clothing Manipulation and Hygiene: Total assistance;+2 for physical assistance;Bed level Toileting - Clothing Manipulation Details (indicate cue type and reason): Total A +2 for perineal care in bed   Tub/Shower Transfer Details (indicate cue type and reason): deferred due to pt safety   General ADL Comments: Pt with generalized weakness and decreased functional use of  BUEs                  Pertinent Vitals/Pain Pain Assessment: Faces Faces Pain Scale: Hurts even more Pain Location: L arm Pain Descriptors / Indicators: Discomfort;Grimacing;Guarding Pain Intervention(s): Limited activity  within patient's tolerance;Monitored during session;Repositioned     Hand Dominance Left   Extremity/Trunk Assessment Upper Extremity Assessment Upper Extremity Assessment: LUE deficits/detail;RUE deficits/detail;Generalized weakness RUE Deficits / Details: RUE rigid and painful to move stuck in synergistic patern from previous CVA RUE: Unable to fully assess due to pain RUE Coordination: decreased fine motor;decreased gross motor LUE Deficits / Details: RUE with decreased strength and ROM. 3/5 flex/ext in wrist, 2+/5 elbow flex/ext, and 2+/5 shoulder flex/ext LUE Sensation: decreased proprioception LUE Coordination: decreased fine motor;decreased gross motor   Lower Extremity Assessment Lower Extremity Assessment: Defer to PT evaluation   Cervical / Trunk Assessment Cervical / Trunk Assessment: Normal   Communication Communication Communication: Expressive difficulties (slurred speech due to lethargy)   Cognition Arousal/Alertness: Lethargic Behavior During Therapy: Flat affect Overall Cognitive Status: Difficult to assess                                 General Comments: Pt very lethargic with slow processing for answering questions and following commands   General Comments  Pt lethargy after HD treatment and needing max stimulation to maintain alertness            Home Living Family/patient expects to be discharged to:: Private residence Living Arrangements: Other (Comment);Non-relatives/Friends Available Help at Discharge: Available 24 hours/day;Friend(s) Type of Home: House Home Access: Level entry     Home Layout: One level     Bathroom Shower/Tub: Teacher, early years/pre: Standard Bathroom Accessibility: Yes How Accessible: Other (comment) (cane) Home Equipment: Wheelchair - [employer] - 2 wheels;Cane - single point   Additional Comments: pt reports living with roommate that helped with ADLs/IADLs      Prior  Functioning/Environment Level of Independence: Needs assistance  Gait / Transfers Assistance Needed: SPC for mobility ADL's / Homemaking Assistance Needed: Assist from roommate with ADLs            OT Problem List: Decreased strength;Decreased range of motion;Decreased activity tolerance;Impaired balance (sitting and/or standing);Decreased coordination;Decreased safety awareness;Decreased knowledge of use of DME or AE;Impaired tone;Impaired UE functional use;Pain      OT Treatment/Interventions: Self-care/ADL training;Neuromuscular education;DME and/or AE instruction;Energy conservation;Manual therapy;Therapeutic activities;Splinting;Patient/family education;Balance training    OT Goals(Current goals can be found in the care plan section) Acute Rehab OT Goals Patient Stated Goal: none stated OT Goal Formulation: With patient Time For Goal Achievement: 04/30/20 Potential to Achieve Goals: Fair  OT Frequency: Min 2X/week              AM-PAC OT "6 Clicks" Daily Activity     Outcome Measure Help from another person eating meals?: Total Help from another person taking care of personal grooming?: A Lot Help from another person toileting, which includes using toliet, bedpan, or urinal?: Total Help from another person bathing (including washing, rinsing, drying)?: Total Help from another person to put on and taking off regular upper body clothing?: A Lot Help from another person to put on and taking off regular lower body clothing?: Total 6 Click Score: 8   End of Session Nurse Communication: Mobility status  Activity Tolerance: Patient limited by lethargy Patient left: in bed;with call bell/phone within reach;with bed alarm set  OT Visit Diagnosis: Muscle weakness (generalized) (M62.81);Pain;Other symptoms and signs involving the nervous system (R29.898) Pain - Right/Left: Right Pain - part of body: Shoulder;Arm;Hand                Time: 0569-7948 OT Time Calculation (min): 23  min Charges:  OT General Charges $OT Visit: 1 Visit OT Evaluation $OT Eval Moderate Complexity: 1 Mod OT Treatments $Self Care/Home Management : 8-22 mins  Wrigley Winborne/OTS  Talal Fritchman 04/16/2020, 3:59 PM

## 2020-04-16 NOTE — Progress Notes (Signed)
   VASCULAR SURGERY ASSESSMENT & PLAN:   ESRD: Currently on hemodialysis with his catheter.  More arousable this week than last week.  If he continues to improve clinically he could potentially be a candidate for a left brachiocephalic fistula during this admission.  Consent would have to be obtained from the family.`   SUBJECTIVE:   Arousable.  PHYSICAL EXAM:   Vitals:   04/16/20 0800 04/16/20 0830 04/16/20 0900 04/16/20 0930  BP: (!) 186/105 (!) 159/92 (!) 159/88 (!) 142/83  Pulse: 93 93 94 100  Resp: 17 16 18 20   Temp:      TempSrc:      SpO2:      Weight:       Palpable radial pulses.  LABS:   Lab Results  Component Value Date   WBC 4.4 04/16/2020   HGB 8.4 (L) 04/16/2020   HCT 26.4 (L) 04/16/2020   MCV 91.0 04/16/2020   PLT 155 04/16/2020   Lab Results  Component Value Date   CREATININE 6.56 (H) 04/16/2020   Lab Results  Component Value Date   INR 1.1 04/13/2020   CBG (last 3)  Recent Labs    04/16/20 0014 04/16/20 0415 04/16/20 0809  GLUCAP 102* 109* 100*    PROBLEM LIST:    Principal Problem:   Encephalopathy Active Problems:   Tobacco abuse   Protein-calorie malnutrition, mild (HCC)   Hyperlipidemia   Neuropathy in diabetes (HCC)   GERD (gastroesophageal reflux disease)   Diabetes mellitus, type II, insulin dependent (HCC)   Altered mental status   CKD (chronic kidney disease) stage 5, GFR less than 15 ml/min (HCC)   History of cerebrovascular accident (CVA) with residual deficit   Seizure ([place])   AIDS (acquired immune deficiency syndrome) (HCC)   ESRD (end stage renal disease) (HCC)   Creatinine elevation   Goals of care, counseling/discussion   Hypertensive urgency   Meningoencephalitis   Palliative care by specialist   CURRENT MEDS:   . carvedilol  3.125 mg Per Tube BID WC  . Chlorhexidine Gluconate Cloth  6 each Topical Q0600  . clopidogrel  75 mg Per Tube Daily  . darbepoetin (ARANESP) injection - DIALYSIS  40 mcg  Intravenous Q Fri-HD  . dolutegravir  50 mg Oral Daily  . feeding supplement (NEPRO CARB STEADY)  1,000 mL Per Tube Q24H  . feeding supplement (PROSource TF)  45 mL Per Tube Daily  . heparin injection (subcutaneous)  5,000 Units Subcutaneous Q8H  . insulin aspart  0-9 Units Subcutaneous Q4H  . lamiVUDine  300 mg Per Tube Daily  . levETIRAcetam  500 mg Oral Q12H  . LORazepam  2 mg Intravenous Once  . pantoprazole sodium  40 mg Per Tube Daily  . pravastatin  80 mg Per Tube QHS  . sulfamethoxazole-trimethoprim  1 tablet Oral Once per day on Mon Wed Fri  . tamsulosin  0.4 mg Oral Daily    Deitra Mayo Office: [phone] 04/16/2020

## 2020-04-16 NOTE — Procedures (Signed)
I was present at this dialysis session. I have reviewed the session itself and made appropriate changes.   BFR 400, 4K. 2L UF Goal.  TDC.  VVS consult 7/22 noted, no current plans for permanent access.    Filed Weights   04/15/20 0500 04/16/20 0500 04/16/20 0730  Weight: 68.4 kg 70.7 kg 67.8 kg    Recent Labs  Lab 04/16/20 0740  NA 139  K 3.5  CL 103  CO2 25  GLUCOSE 106*  BUN 48*  CREATININE 6.56*  CALCIUM 7.7*  PHOS 3.5    Recent Labs  Lab 04/10/20 2037 04/11/20 0018 04/12/20 0434 04/14/20 1137 04/16/20 0740  WBC 6.6   < > 6.5 4.9 4.4  NEUTROABS 4.7  --  5.0 3.6  --   HGB 10.3*   < > 8.2* 9.2* 8.4*  HCT 31.5*   < > 24.5* 27.5* 26.4*  MCV 90.0   < > 88.1 87.0 91.0  PLT 192   < > 173 180 155   < > = values in this interval not displayed.    Scheduled Meds: . carvedilol  3.125 mg Per Tube BID WC  . Chlorhexidine Gluconate Cloth  6 each Topical Q0600  . clopidogrel  75 mg Per Tube Daily  . darbepoetin (ARANESP) injection - DIALYSIS  40 mcg Intravenous Q Fri-HD  . dolutegravir  50 mg Oral Daily  . feeding supplement (NEPRO CARB STEADY)  1,000 mL Per Tube Q24H  . feeding supplement (PROSource TF)  45 mL Per Tube Daily  . heparin injection (subcutaneous)  5,000 Units Subcutaneous Q8H  . insulin aspart  0-9 Units Subcutaneous Q4H  . lamiVUDine  300 mg Per Tube Daily  . levETIRAcetam  500 mg Oral Q12H  . LORazepam  2 mg Intravenous Once  . pantoprazole sodium  40 mg Per Tube Daily  . pravastatin  80 mg Per Tube QHS  . sulfamethoxazole-trimethoprim  1 tablet Oral Once per day on Mon Wed Fri  . tamsulosin  0.4 mg Oral Daily   Continuous Infusions: . levETIRAcetam Stopped (04/16/20 0827)   PRN Meds:.hydrALAZINE, labetalol   Richard Grippe  MD 04/16/2020, 9:23 AM

## 2020-04-17 DIAGNOSIS — R569 Unspecified convulsions: Secondary | ICD-10-CM | POA: Diagnosis not present

## 2020-04-17 DIAGNOSIS — A86 Unspecified viral encephalitis: Secondary | ICD-10-CM

## 2020-04-17 DIAGNOSIS — R404 Transient alteration of awareness: Secondary | ICD-10-CM | POA: Diagnosis not present

## 2020-04-17 DIAGNOSIS — G9340 Encephalopathy, unspecified: Secondary | ICD-10-CM | POA: Diagnosis not present

## 2020-04-17 DIAGNOSIS — B20 Human immunodeficiency virus [HIV] disease: Secondary | ICD-10-CM | POA: Diagnosis not present

## 2020-04-17 DIAGNOSIS — N186 End stage renal disease: Secondary | ICD-10-CM | POA: Diagnosis not present

## 2020-04-17 LAB — GLUCOSE, CAPILLARY
Glucose-Capillary: 99 mg/dL (ref 70–99)
Glucose-Capillary: 93 mg/dL (ref 70–99)
Glucose-Capillary: 104 mg/dL — ABNORMAL HIGH (ref 70–99)
Glucose-Capillary: 124 mg/dL — ABNORMAL HIGH (ref 70–99)
Glucose-Capillary: 119 mg/dL — ABNORMAL HIGH (ref 70–99)
Glucose-Capillary: 104 mg/dL — ABNORMAL HIGH (ref 70–99)

## 2020-04-17 LAB — PTH, INTACT AND CALCIUM: Calcium, Total (PTH): 7.7 mg/dL — ABNORMAL LOW (ref 8.6–10.2)

## 2020-04-17 MED ORDER — DOVATO 50-300 MG PO TABS: 1.0000 | ORAL_TABLET | Freq: Every day | ORAL | 0 refills | Status: DC

## 2020-04-17 MED ORDER — DARBEPOETIN ALFA 40 MCG/0.4ML IJ SOSY
40.0000 ug | PREFILLED_SYRINGE | INTRAMUSCULAR | Status: DC
  Filled 2020-04-17: qty 0.4

## 2020-04-17 NOTE — Progress Notes (Signed)
PROGRESS NOTE    Richard [name]  BLT:[id number] DOB: [birth year] DOA: 04/10/2020 PCP: [name], MD   Brief Narrative:  HPI On 04/10/2020 by Dr. Phillips Climes Richard [name]  is a 64 y.o. male, with medical history significant ofdiabetes, osteoarthritis, hypertension,CKD 5,neuropathies, previous CVA with residual right-sided weakness, migraine headaches , patient with extremely poor compliance, he has refused hemodialysis access in the past, presents to ED via EMS status post seizures, he is known history of seizures, but has not been noncompliant with his seizure medications, seizures was witnessed by his roommate, seizures lasted for 4 minutes, she will patient's extremely encephalopathic, unable to provide any history, but upon my evaluation is more appropriate, patient report actually he never got his seizure medications, was not taking it, patient denies any fever, chest pain, shortness of breath, no dysuria or polyuria, reports usually ambulates with a walker, he has right-sided weakness at baseline.  Interim history Admitted for AMS, found to have CVA and seizure. Neuro was consulted. Currently on AED. Has cortrak. Vascular consulted for access placement as patient recently started HD for ESRD. Nephro c/s.  Assessment & Plan   Acute metabolic encephalopathy -likely multifactorial including seizure, hypertension, acute stroke, uremia -Patient continues to be somewhat sleepy during conversation although per documentation, appears to be improving since previous days -See diagnoses below  Acute CVA -Also with history of CVA with residual right-sided weakness and chronic right upper extremity contracture -Initial CT scan on admission no acute findings -Due to acute mental status change on the morning of July 22, code stroke initiated, stat CT scan no acute findings, stat MRI showed "2 mm acute or early subacute infarct within the left parietal lobe periatrial white  matter" -HIV screening positive -s/p Lumbar puncture on 7/24, CSF Gram stain negative. Cryptococcus PCR negative. VDRL CSF is pending. -PT, OT recommended SNF -Currently has cortrak- will reconsult Speech- given that patient has mildly improved -Neurology consulted and appreciated -Continue Plavix, statin  Recurrent seizures -present on admission -He is noncompliant with his seizure medication -cEEG stopped on July 24 "This study is suggestive of moderate diffuse encephalopathy, nonspecific etiology.No seizures or epileptiform discharges were seen throughout the recording" -s/p  Lumbar puncture on 7/24, CSF Gram stain negative. Cryptococcus PCR negative. VDRL CSF is pending. -was on IV Keppra and IV Vimpat , now on iv keppra only per neurology recommendation -Appreciate neurology recommendation  Hypertensive urgency, present on admission -Resolved -Currently on Coreg  Chronic kidney disease, stage V/uremia-ESRD  -Creatinine in 10-11 range, creatinine appears to be at baseline. -Patient has refused dialysis in the past,  he is agreeable to dialysis on presentation -Nephrology consulted and appreicated -Interventional radiology consulted for dialysis catheter placement -Patient now on hemodialysis which started on 04/13/2020 -Vascular surgery consulted and appreciated for placement of access  Anemia of chronic disease -hemoglobin stable, continue to monitor CBC  HIV/AIDS  -CD4 count 39, viral load 624,000 -New diagnosis -Infectious disease consulted and appreciated-continue tivicay, epivir -Currently on bactrim  Dark skin lesions -nontender, does not itch -molluscum contagiosum ? Kaposi sarcoma?  Diabetes mellitus, type II with episode of hypoglycemia -Trolled, hemoglobin A1c 5.2 (02/14/2020) -On Novolin 70/30 at home -Had hypoglycemia requiring D5 infusion briefly on 7/23,  -Continue insulin sliding scale with CBG monitoring  Hyperlipidemia -Total cholesterol  147, HDL 31, LDL 91, triglycerides 125 -Continue statin  Tobacco use -Cessation discussed by previous physician   History of severe noncompliance -Per previous documentation, patient counseled  Goals of care discussion -Palliative  care consulted and appreciated  DVT Prophylaxis  heaprin  Code Status: Full  Family Communication: None at bedside  Disposition Plan:  Status is: Inpatient  Remains inpatient appropriate because:Altered mental status, IV treatments appropriate due to intensity of illness or inability to take PO and Inpatient level of care appropriate due to severity of illness   Dispo: The patient is from: Home              Anticipated d/c is to: TBD              Anticipated d/c date is: > 3 days              Patient currently is not medically stable to d/c.   Consultants Nephrology Neurology Advanced radiology Infectious disease Palliative care  Procedures  Bedside lumbar puncture attempted July 24 by neurology Lumbar puncture by IR on July 24 Dialysis catheter placement by interventional radiology   Antibiotics   Anti-infectives (From admission, onward)   Start     Dose/Rate Route Frequency Ordered Stop   04/16/20 1000  lamiVUDine (EPIVIR) tablet 300 mg     Discontinue    Note to Pharmacy: I DO NOT renally dose lamivudine and I have never had problems NOT adjusting it. I want pt to be on Dovato but still not on formulary here (unfortunately)   300 mg Per Tube Daily 04/15/20 1307     04/16/20 0900  sulfamethoxazole-trimethoprim (BACTRIM DS) 800-160 MG per tablet 1 tablet     Discontinue     1 tablet Oral Once per day on Mon Wed Fri 04/15/20 1350     04/15/20 1245  dolutegravir (TIVICAY) tablet 50 mg     Discontinue     50 mg Oral Daily 04/15/20 1202     04/15/20 1245  lamiVUDine (EPIVIR) tablet 300 mg  Status:  Discontinued       Note to Pharmacy: I DO NOT renally dose lamivudine and I have never had problems NOT adjusting it. I want pt to be on  Dovato but still not on formulary here (unfortunately)   300 mg Oral Daily 04/15/20 1202 04/15/20 1307   04/13/20 1737  ceFAZolin (ANCEF) 2-4 GM/100ML-% IVPB       Note to Pharmacy: Desiree Hane   : cabinet override      04/13/20 1737 04/14/20 0544   04/13/20 0000  ceFAZolin (ANCEF) IVPB 2g/100 mL premix        2 g 200 mL/hr over 30 Minutes Intravenous To Radiology 04/12/20 1454 04/13/20 0834      Subjective:   Ronne Binning seen and examined today.  Very sleepy this morning and tells me he wants to sleepy. Denies chest pain, shortness of breath, abdominal pain..    Objective:   Vitals:   04/16/20 2325 04/17/20 0317 04/17/20 0500 04/17/20 0819  BP: (!) 159/81 (!) 167/97  (!) 160/97  Pulse: 84 86  86  Resp: 15 14  15   Temp: 98.3 F (36.8 C) 98.5 F (36.9 C)  97.8 F (36.6 C)  TempSrc: Other (Comment) Oral  Axillary  SpO2: 99% 97%  100%  Weight:   68.9 kg     Intake/Output Summary (Last 24 hours) at 04/17/2020 0902 Last data filed at 04/16/2020 1110 Gross per 24 hour  Intake --  Output 2000 ml  Net -2000 ml   Filed Weights   04/16/20 0730 04/16/20 1110 04/17/20 0500  Weight: 67.8 kg 65.8 kg 68.9 kg   Exam  General: Well  developed, chronically ill appearing, NAD  HEENT: NCAT,  mucous membranes moist. Cortrak   Cardiovascular: S1 S2 auscultated, RRR.  Respiratory: Clear to auscultation bilaterally   Abdomen: Soft, nontender, nondistended, + bowel sounds  Extremities: warm dry without cyanosis clubbing or edema, RUE contracture  Neuro: Sleepy, can answers some questions.   Skin: Diffuse rash  Data Reviewed: I have personally reviewed following labs and imaging studies  CBC: Recent Labs  Lab 04/10/20 2037 04/10/20 2037 04/11/20 0018 04/11/20 0402 04/12/20 0434 04/14/20 1137 04/16/20 0740  WBC 6.6   < > 6.8 9.1 6.5 4.9 4.4  NEUTROABS 4.7  --   --   --  5.0 3.6  --   HGB 10.3*   < > 11.4* 9.7* 8.2* 9.2* 8.4*  HCT 31.5*   < > 34.5* 29.4* 24.5*  27.5* 26.4*  MCV 90.0   < > 87.6 89.4 88.1 87.0 91.0  PLT 192   < > 201 195 173 180 155   < > = values in this interval not displayed.   Basic Metabolic Panel: Recent Labs  Lab 04/11/20 0402 04/12/20 0434 04/13/20 1113 04/13/20 1113 04/13/20 1829 04/14/20 0511 04/14/20 1137 04/14/20 1944 04/15/20 0452 04/16/20 0740  NA 145 143 148*  --   --   --  143  --   --  139  K 4.4 4.4 4.5  --   --   --  3.8  --   --  3.5  CL 117* 116* 119*  --   --   --  110  --   --  103  CO2 11* 13* 12*  --   --   --  21*  --   --  25  GLUCOSE 94 88 68*  --   --   --  107*  --   --  106*  BUN 78* 86* 94*  --   --   --  55*  --   --  48*  CREATININE 9.99* 10.66* 11.51*  --   --   --  7.74*  --   --  6.56*  CALCIUM 7.6* 7.5* 8.1*  --   --   --  8.0*  --   --  7.7*  MG  --   --   --   --  1.6* 1.5*  --  1.6* 2.2  --   PHOS  --   --  7.6*   < > 8.3* 4.4  --  2.5 3.4 3.5   < > = values in this interval not displayed.   GFR: CrCl cannot be calculated (Unknown ideal weight.). Liver Function Tests: Recent Labs  Lab 04/10/20 2037 04/13/20 1113 04/14/20 1137 04/16/20 0740  AST 13*  --  19  --   ALT 10  --  8  --   ALKPHOS 95  --  79  --   BILITOT 0.5  --  0.4  --   PROT 6.6  --  6.1*  --   ALBUMIN 2.8* 2.3* 2.3* 2.1*   No results for input(s): LIPASE, AMYLASE in the last 168 hours. No results for input(s): AMMONIA in the last 168 hours. Coagulation Profile: Recent Labs  Lab 04/13/20 1113  INR 1.1   Cardiac Enzymes: No results for input(s): CKTOTAL, CKMB, CKMBINDEX, TROPONINI in the last 168 hours. BNP (last 3 results) Recent Labs    06/22/19 1505  PROBNP 170.0*   HbA1C: No results for input(s): HGBA1C in the last 72 hours. CBG:  Recent Labs  Lab 04/16/20 1617 04/16/20 1945 04/16/20 2322 04/17/20 0313 04/17/20 0821  GLUCAP 73 111* 102* 99 93   Lipid Profile: No results for input(s): CHOL, HDL, LDLCALC, TRIG, CHOLHDL, LDLDIRECT in the last 72 hours. Thyroid Function Tests: No  results for input(s): TSH, T4TOTAL, FREET4, T3FREE, THYROIDAB in the last 72 hours. Anemia Panel: No results for input(s): VITAMINB12, FOLATE, FERRITIN, TIBC, IRON, RETICCTPCT in the last 72 hours. Urine analysis:    Component Value Date/Time   COLORURINE STRAW (A) 04/11/2020 0223   APPEARANCEUR CLEAR 04/11/2020 0223   LABSPEC 1.011 04/11/2020 0223   PHURINE 5.0 04/11/2020 0223   GLUCOSEU 50 (A) 04/11/2020 0223   HGBUR SMALL (A) 04/11/2020 0223   BILIRUBINUR NEGATIVE 04/11/2020 0223   KETONESUR NEGATIVE 04/11/2020 0223   PROTEINUR >=300 (A) 04/11/2020 0223   UROBILINOGEN 0.2 11/29/2014 2003   NITRITE NEGATIVE 04/11/2020 0223   LEUKOCYTESUR NEGATIVE 04/11/2020 0223   Sepsis Labs: @LABRCNTIP (procalcitonin:4,lacticidven:4)  ) Recent Results (from the past 240 hour(s))  SARS Coronavirus 2 by RT PCR (hospital order, performed in [facility] hospital lab) Nasopharyngeal Nasopharyngeal Swab     Status: None   Collection Time: 04/11/20  3:58 AM   Specimen: Nasopharyngeal Swab  Result Value Ref Range Status   SARS Coronavirus 2 NEGATIVE NEGATIVE Final    Comment: (NOTE) SARS-CoV-2 target nucleic acids are NOT DETECTED.  The SARS-CoV-2 RNA is generally detectable in upper and lower respiratory specimens during the acute phase of infection. The lowest concentration of SARS-CoV-2 viral copies this assay can detect is 250 copies / mL. A negative result does not preclude SARS-CoV-2 infection and should not be used as the sole basis for treatment or other patient management decisions.  A negative result may occur with improper specimen collection / handling, submission of specimen other than nasopharyngeal swab, presence of viral mutation(s) within the areas targeted by this assay, and inadequate number of viral copies (<250 copies / mL). A negative result must be combined with clinical observations, patient history, and epidemiological information.  Fact Sheet for Patients:    StrictlyIdeas.no  Fact Sheet for Healthcare Providers: BankingDealers.co.za  This test is not yet approved or  cleared by the [place] FDA and has been authorized for detection and/or diagnosis of SARS-CoV-2 by FDA under an Emergency Use Authorization (EUA).  This EUA will remain in effect (meaning this test can be used) for the duration of the COVID-19 declaration under Section 564(b)(1) of the Act, 21 U.S.C. section 360bbb-3(b)(1), unless the authorization is terminated or revoked sooner.  Performed at [facility], [place] [address], [place], [place] [address]   Culture, blood (routine x 2)     Status: None (Preliminary result)   Collection Time: 04/14/20 11:31 AM   Specimen: BLOOD RIGHT HAND  Result Value Ref Range Status   Specimen Description BLOOD RIGHT HAND  Final   Special Requests   Final    BOTTLES DRAWN AEROBIC ONLY Blood Culture results may not be optimal due to an inadequate volume of blood received in culture bottles   Culture   Final    NO GROWTH 2 DAYS Performed at [facility], [place] [address] [place], [place] [address]    Report Status PENDING  Incomplete  Culture, blood (routine x 2)     Status: None (Preliminary result)   Collection Time: 04/14/20 11:36 AM   Specimen: BLOOD RIGHT HAND  Result Value Ref Range Status   Specimen Description BLOOD RIGHT HAND  Final   Special Requests   Final    BOTTLES DRAWN AEROBIC ONLY Blood Culture results may not be optimal due to an inadequate volume of blood received in culture bottles   Culture   Final    NO GROWTH 2 DAYS Performed at [facility], [place] [address], [place], [place] [address]    Report Status PENDING  Incomplete  CSF culture     Status: None (Preliminary result)   Collection Time: 04/14/20  6:27 PM   Specimen: PATH Cytology CSF; Cerebrospinal Fluid  Result Value Ref Range Status   Specimen Description CSF  Final   Special Requests  NONE  Final   Gram Stain NO WBC SEEN NO ORGANISMS SEEN CYTOSPIN SMEAR   Final   Culture   Final    NO GROWTH 2 DAYS Performed at [facility], [place] [address], [place], [place] [address]    Report Status PENDING  Incomplete  Fungus Stain     Status: None   Collection Time: 04/14/20  6:27 PM   Specimen: PATH Cytology CSF; Cerebrospinal Fluid  Result Value Ref Range Status   FUNGUS STAIN Final report  Final    Comment: (NOTE) Performed At: [facility] [address] [place], [place] [id number] Rush Farmer MD IO:[id number]    Fungal Source CSF  Final    Comment: Performed at [facility], [place] [address], [place], [place] [address]  Fungal Stain reflex     Status: None   Collection Time: 04/14/20  6:27 PM  Result Value Ref Range Status   Fungal stain result 1 Comment  Final    Comment: (NOTE) KOH/Calcofluor preparation:  no fungus observed. Performed At: [facility] [place], [place] [id number] Rush Farmer MD QI:[id number]       Radiology Studies: No results found.   Scheduled Meds: . carvedilol  3.125 mg Per Tube BID WC  . Chlorhexidine Gluconate Cloth  6 each Topical Q0600  . clopidogrel  75 mg Per Tube Daily  . darbepoetin (ARANESP) injection - DIALYSIS  40 mcg Intravenous Q Fri-HD  . dolutegravir  50 mg Oral Daily  . feeding supplement (NEPRO CARB STEADY)  1,000 mL Per Tube Q24H  . feeding supplement (PROSource TF)  45 mL Per Tube Daily  . heparin injection (subcutaneous)  5,000 Units Subcutaneous Q8H  . insulin aspart  0-9 Units Subcutaneous Q4H  . lamiVUDine  300 mg Per Tube Daily  . levETIRAcetam  500 mg Oral Q12H  . LORazepam  2 mg Intravenous Once  . pantoprazole sodium  40 mg Per Tube Daily  . pravastatin  80 mg Per Tube QHS  . sulfamethoxazole-trimethoprim  1 tablet Oral Once per day on Mon Wed Fri  . tamsulosin  0.4 mg Oral Daily   Continuous Infusions: . levETIRAcetam 500 mg (04/16/20 2134)     LOS:  7 days   Time Spent in minutes   30 minutes  Danelly Hassinger D.O. on 04/17/2020 at 9:02 AM  Between 7am to 7pm - Please see pager noted on [email or web address]  After 7pm go to [email or web address]  And look for the night coverage person covering for me after hours  [facility] Office  [phone]

## 2020-04-17 NOTE — Progress Notes (Signed)
[name]  Date of Admission:  04/10/2020     Total days of antibiotics 1         ASSESSMENT:  Richard [name] remains seizure free since admission and appears to be tolerating his Tivicay and Lamivudine supplemented with Bactrim for OI prophylaxis. Neurology managing antiepileptic medications. Will need to obtain financial assistance for ongoing HIV care and will contact [facility] financial assistance staff to get process started.  Continue with Tivicay and Lamivudine supplemented with Bactrim. Awaiting possible fistula placement by Vascular Surgery for dialysis access.   PLAN:  1. Continue Tivicay and Lamivudine.  2. Continue Bactrim for OI prophylaxis. 3. Seizure management per neurology and primary team. 4. Awaiting potential vascular access procedure. 5. Will have [facility] financial counselors set up assistance programs.   Principal Problem:   Encephalopathy Active Problems:   Tobacco abuse   Protein-calorie malnutrition, mild (HCC)   Hyperlipidemia   Neuropathy in diabetes (HCC)   GERD (gastroesophageal reflux disease)   Diabetes mellitus, type II, insulin dependent (HCC)   Altered mental status   CKD (chronic kidney disease) stage 5, GFR less than 15 ml/min (HCC)   History of cerebrovascular accident (CVA) with residual deficit   Seizure ([place])   AIDS (acquired immune deficiency syndrome) ([place])   ESRD (end stage renal disease) ([place])   Creatinine elevation   Goals of care, counseling/discussion   Hypertensive urgency   Meningoencephalitis   Palliative care by specialist   . carvedilol  3.125 mg Per Tube BID WC  . Chlorhexidine Gluconate Cloth  6 each Topical Q0600  . clopidogrel  75 mg Per Tube Daily  . darbepoetin (ARANESP) injection - DIALYSIS  40 mcg Intravenous Q Fri-HD  . dolutegravir  50 mg Oral Daily  . feeding supplement (NEPRO CARB STEADY)  1,000 mL Per Tube Q24H  . feeding supplement (PROSource TF)  45 mL Per Tube Daily  . heparin  injection (subcutaneous)  5,000 Units Subcutaneous Q8H  . insulin aspart  0-9 Units Subcutaneous Q4H  . lamiVUDine  300 mg Per Tube Daily  . levETIRAcetam  500 mg Oral Q12H  . LORazepam  2 mg Intravenous Once  . pantoprazole sodium  40 mg Per Tube Daily  . pravastatin  80 mg Per Tube QHS  . sulfamethoxazole-trimethoprim  1 tablet Oral Once per day on Mon Wed Fri  . tamsulosin  0.4 mg Oral Daily    SUBJECTIVE:  Afebrile overnight with no acute events. Easily awakened and wanting to sleep.   No Known Allergies   Review of Systems: Review of Systems  Constitutional: Negative for chills, fever and weight loss.  Respiratory: Negative for cough, shortness of breath and wheezing.   Cardiovascular: Negative for chest pain and leg swelling.  Gastrointestinal: Negative for abdominal pain, constipation, diarrhea, nausea and vomiting.  Skin: Negative for rash.     OBJECTIVE: Vitals:   04/17/20 0317 04/17/20 0500 04/17/20 0819 04/17/20 1159  BP: (!) 167/97  (!) 160/97 (!) 152/88  Pulse: 86  86 86  Resp: 14  15 (!) 11  Temp: 98.5 F (36.9 C)  97.8 F (36.6 C) (!) 97.5 F (36.4 C)  TempSrc: Oral  Axillary Oral  SpO2: 97%  100% 96%  Weight:  68.9 kg     Body mass index is 20.61 kg/m.  Physical Exam Constitutional:      General: He is not in acute distress.    Appearance: He is well-developed.     Comments: Lying  in bed resting.   HENT:     Nose:     Comments: Cortrak in place.  Eyes:     Conjunctiva/sclera: Conjunctivae normal.  Cardiovascular:     Rate and Rhythm: Normal rate and regular rhythm.     Heart sounds: Normal heart sounds. No murmur heard.  No friction rub. No gallop.   Pulmonary:     Effort: Pulmonary effort is normal. No respiratory distress.     Breath sounds: Normal breath sounds. No wheezing or rales.  Chest:     Chest wall: No tenderness.  Abdominal:     General: Bowel sounds are normal.     Palpations: Abdomen is soft.     Tenderness: There is no  abdominal tenderness.  Musculoskeletal:     Cervical back: Neck supple.  Lymphadenopathy:     Cervical: No cervical adenopathy.  Skin:    General: Skin is warm and dry.     Findings: No rash.  Neurological:     Mental Status: He is alert.     Lab Results Lab Results  Component Value Date   WBC 4.4 04/16/2020   HGB 8.4 (L) 04/16/2020   HCT 26.4 (L) 04/16/2020   MCV 91.0 04/16/2020   PLT 155 04/16/2020    Lab Results  Component Value Date   CREATININE 6.56 (H) 04/16/2020   BUN 48 (H) 04/16/2020   NA 139 04/16/2020   K 3.5 04/16/2020   CL 103 04/16/2020   CO2 25 04/16/2020    Lab Results  Component Value Date   ALT 8 04/14/2020   AST 19 04/14/2020   ALKPHOS 79 04/14/2020   BILITOT 0.4 04/14/2020     Microbiology: Recent Results (from the past 240 hour(s))  SARS Coronavirus 2 by RT PCR (hospital order, performed in [facility] hospital lab) Nasopharyngeal Nasopharyngeal Swab     Status: None   Collection Time: 04/11/20  3:58 AM   Specimen: Nasopharyngeal Swab  Result Value Ref Range Status   SARS Coronavirus 2 NEGATIVE NEGATIVE Final    Comment: (NOTE) SARS-CoV-2 target nucleic acids are NOT DETECTED.  The SARS-CoV-2 RNA is generally detectable in upper and lower respiratory specimens during the acute phase of infection. The lowest concentration of SARS-CoV-2 viral copies this assay can detect is 250 copies / mL. A negative result does not preclude SARS-CoV-2 infection and should not be used as the sole basis for treatment or other patient management decisions.  A negative result may occur with improper specimen collection / handling, submission of specimen other than nasopharyngeal swab, presence of viral mutation(s) within the areas targeted by this assay, and inadequate number of viral copies (<250 copies / mL). A negative result must be combined with clinical observations, patient history, and epidemiological information.  Fact Sheet for Patients:    StrictlyIdeas.no  Fact Sheet for Healthcare Providers: BankingDealers.co.za  This test is not yet approved or  cleared by the [place] FDA and has been authorized for detection and/or diagnosis of SARS-CoV-2 by FDA under an Emergency Use Authorization (EUA).  This EUA will remain in effect (meaning this test can be used) for the duration of the COVID-19 declaration under Section 564(b)(1) of the Act, 21 U.S.C. section 360bbb-3(b)(1), unless the authorization is terminated or revoked sooner.  Performed at [facility], [place] [address] [place], [place] [address]   Culture, blood (routine x 2)     Status: None (Preliminary result)   Collection Time: 04/14/20 11:31 AM   Specimen:  BLOOD RIGHT HAND  Result Value Ref Range Status   Specimen Description BLOOD RIGHT HAND  Final   Special Requests   Final    BOTTLES DRAWN AEROBIC ONLY Blood Culture results may not be optimal due to an inadequate volume of blood received in culture bottles   Culture   Final    NO GROWTH 2 DAYS Performed at [facility], [place] [address] [place], [place] [address]    Report Status PENDING  Incomplete  Culture, blood (routine x 2)     Status: None (Preliminary result)   Collection Time: 04/14/20 11:36 AM   Specimen: BLOOD RIGHT HAND  Result Value Ref Range Status   Specimen Description BLOOD RIGHT HAND  Final   Special Requests   Final    BOTTLES DRAWN AEROBIC ONLY Blood Culture results may not be optimal due to an inadequate volume of blood received in culture bottles   Culture   Final    NO GROWTH 2 DAYS Performed at [facility], [place] [address] [place], [place] [address]    Report Status PENDING  Incomplete  CSF culture     Status: None (Preliminary result)   Collection Time: 04/14/20  6:27 PM   Specimen: PATH Cytology CSF; Cerebrospinal Fluid  Result Value Ref Range Status   Specimen Description CSF  Final   Special Requests  NONE  Final   Gram Stain NO WBC SEEN NO ORGANISMS SEEN CYTOSPIN SMEAR   Final   Culture   Final    NO GROWTH 3 DAYS Performed at [facility], [address], [place], [place] [address]    Report Status PENDING  Incomplete  Fungus Stain     Status: None   Collection Time: 04/14/20  6:27 PM   Specimen: PATH Cytology CSF; Cerebrospinal Fluid  Result Value Ref Range Status   FUNGUS STAIN Final report  Final    Comment: (NOTE) Performed At: [facility] [address] [place], [place] [id number] Rush Farmer MD PX:[id number]    Fungal Source CSF  Final    Comment: Performed at [facility], [place] [address] [place], [place] [address]  Fungal Stain reflex     Status: None   Collection Time: 04/14/20  6:27 PM  Result Value Ref Range Status   Fungal stain result 1 Comment  Final    Comment: (NOTE) KOH/Calcofluor preparation:  no fungus observed. Performed At: [facility] [address] [place], [place] [id number] Rush Farmer MD XH:[id number]      Terri Piedra, [facility]  04/17/2020  12:21 PM

## 2020-04-17 NOTE — Progress Notes (Signed)
Daily Progress Note   Patient Name: Richard [name]       Date: 04/17/2020 DOB: 05-11-1956  Age: 64 y.o. MRN#: [id number] Attending Physician: Cristal Ford, DO Primary Care Physician: [name], MD Admit Date: 04/10/2020  Reason for Consultation/Follow-up: Establishing goals of care  Subjective: Patient is drowsy but will wake to voice. He is oriented to name, disoriented to time/place/situation. He can answer simple questions about his daughter, [name]. He follows simple commands. Denies pain.   Discussed with RN, SW, and Dr. Ree Kida. No family at bedside.   GOC: Spoke with daughter, [name] via telephone. Provided update on diagnoses, interventions, plan of care. [name] remains overwhelmed with decision making. She wishes for ongoing full code/full scope treatment "whatever they can do for him." Explained hope that with ongoing dialysis, antiviral therapy/medical management he will perk up. Explained SLP evaluation and initiation of diet this afternoon. We discussed hope that he will be able to eat to sustain nutritional status. If not, he could not discharge to SNF with cortrak so may need to further discuss PEG tube placement based on his oral intake.   We briefly discussed consideration of DNR/DNI limitation to care if his condition took a turn for the worst. [name] overwhelmed and cannot answer this question today.   Winter cannot return home on discharge. He will need SNF placement and likely eventual LTC placement. Reassured daughter that social work will be involved to assist with placement but also that her father will be in the hospital a little longer. Reassured of ongoing palliative support and that if he does not make progress, a PMT provider can arrange  [place] meeting with her and biological daughter, Lenna Sciara so decisions can be made together since this is very overwhelming for [place]. (Biological daughter Lenna Sciara has been estranged from [place] for many years and on 7/25, deferred decisions to [place]).   Length of Stay: 7   Current Medications: Scheduled Meds:  . carvedilol  3.125 mg Per Tube BID WC  . Chlorhexidine Gluconate Cloth  6 each Topical Q0600  . clopidogrel  75 mg Per Tube Daily  . [START ON 04/18/2020] darbepoetin (ARANESP) injection - DIALYSIS  40 mcg Intravenous Q Wed-HD  . dolutegravir  50 mg Oral Daily  . feeding supplement (NEPRO CARB STEADY)  1,000 mL Per Tube Q24H  .  feeding supplement (PROSource TF)  45 mL Per Tube Daily  . heparin injection (subcutaneous)  5,000 Units Subcutaneous Q8H  . insulin aspart  0-9 Units Subcutaneous Q4H  . lamiVUDine  300 mg Per Tube Daily  . levETIRAcetam  500 mg Oral Q12H  . LORazepam  2 mg Intravenous Once  . pantoprazole sodium  40 mg Per Tube Daily  . pravastatin  80 mg Per Tube QHS  . sulfamethoxazole-trimethoprim  1 tablet Oral Once per day on Mon Wed Fri  . tamsulosin  0.4 mg Oral Daily    Continuous Infusions: . levETIRAcetam 500 mg (04/17/20 0914)    PRN Meds: hydrALAZINE, labetalol  Physical Exam Vitals and nursing note reviewed.  Constitutional:      Appearance: He is ill-appearing.  HENT:     Head: Normocephalic and atraumatic.  Cardiovascular:     Rate and Rhythm: Normal rate.  Pulmonary:     Effort: No tachypnea, accessory muscle usage or respiratory distress.  Abdominal:     Comments: Cortrak/tube feeds  Skin:    General: Skin is warm and dry.  Neurological:     Comments: Wakes to voice, oriented to name otherwise disorientd. Follows simple commands.             Vital Signs: BP (!) 152/88 (BP Location: Left Arm)   Pulse 86   Temp (!) 97.5 F (36.4 C) (Oral)   Resp (!) 11   Wt 68.9 kg   SpO2 96%   BMI 20.61 kg/m  SpO2: SpO2: 96 % O2 Device: O2  Device: Room Air O2 Flow Rate: O2 Flow Rate (L/min): 2 L/min  Intake/output summary:  No intake or output data in the 24 hours ending 04/17/20 1448 LBM: Last BM Date: 04/15/20 Baseline Weight: Weight: 69.1 kg Most recent weight: Weight: 68.9 kg       Palliative Assessment/Data: PPS 30%      Patient Active Problem List   Diagnosis Date Noted  . Creatinine elevation   . Goals of care, counseling/discussion   . Hypertensive urgency   . Meningoencephalitis   . Palliative care by specialist   . ESRD (end stage renal disease) ([place])   . AIDS (acquired immune deficiency syndrome) ([place]) 04/13/2020  . Encephalopathy 04/13/2020  . Seizure ([place]) 04/10/2020  . History of cerebrovascular accident (CVA) with residual deficit 03/19/2020  . CKD (chronic kidney disease) stage 5, GFR less than 15 ml/min (HCC) 02/15/2020  . Altered mental status 02/13/2020  . Neuropathy in diabetes ([place])   . Migraines   . Hypertension   . GERD (gastroesophageal reflux disease)   . Diabetes mellitus, type II, insulin dependent ([place])   . Arthritis   . Hyperlipidemia 08/25/2012  . Chronic kidney disease, stage 4 (severe) ([place]) 08/23/2012  . Tobacco abuse 08/23/2012  . Protein-calorie malnutrition, mild ([place]) 08/23/2012  . 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 He was started back on Keppra and hypertension was treated with labetalol and hydralazine. MRI of brain showed 2 mm infarct in left parietal lobe. A tunneled dialysis  catheter was placed and  Dialysis was started on 7/23 with imporvement. 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 Diffuse rash present concerning for possible disseminated cryptococcal infection. He is awaiting LP by IR.  Assessment: Acute metabolic encephalopathy Seizure Acute stroke with hx of stroke CKD stage V, now ESRD on dialysis Uremia New diagnosis HIV/AIDS Insulin-dependent DM2 Hx of noncompliance  Recommendations/Plan: Continue FULL code/FULL scope treatment  Ongoing palliative discussions pending clinical course.  Continue PT/OT/SLP efforts. Diet advanced today. Will need SNF placement on discharge.  Patient has one biological daughter Lenna Sciara) but she has been estranged for many years.  Patient raised [name], who has been caring for him for many months.[name] has called Melissa and other family members urging them to help her make decisions and care for [place]. Melissa at bedside on 7/25. She defers decisions to [place].  PMT provider hopeful that [name] and Lenna Sciara can make decisions together if his condition declines. [name] is overwhelmed and needs ongoing support and updates.   Goals of Care and Additional Recommendations: Limitations on Scope of Treatment: Full Scope Treatment  Code Status: FULL   Code Status Orders  (From admission, onward)         Start     Ordered   04/10/20 2332  Full code  Continuous        04/10/20 2331        Code Status History    Date Active Date Inactive Code Status Order ID Comments User Context   02/13/2020 2345 02/16/2020 2347 Full Code [id number]  Elwyn Reach, MD Inpatient   11/29/2014 2333 11/30/2014 1920 Full Code [id number]  Allyne Gee, MD Inpatient   Advance Care Planning Activity      Prognosis:  Unable to determine: guarded  Discharge Planning: To Be Determined  Care plan was discussed with RN, Dr. Alvina Chou  Thank you for allowing the Palliative  Medicine Team to assist in the care of this patient.   Total Time 30 Prolonged Time Billed no    Greater than 50% of this time was spent counseling and coordinating care related to the above assessment and plan.   Ihor Dow, DNP, FNP-C Palliative Medicine Team  Phone: [phone] Fax: [phone]  Please contact Palliative Medicine Team phone at [phone] for questions and concerns.

## 2020-04-17 NOTE — Progress Notes (Signed)
Admit: 04/10/2020 LOS: 7  32M new ESRD, seizures, HIV/AIDS  Subjective:  . HD yesterday uneventful . VVS notes reviewed, not stable at this time for AVF/G . CLIP in process but sig social barriers   07/26 0701 - 07/27 0700 In: -  Out: 2000   Filed Weights   04/16/20 0730 04/16/20 1110 04/17/20 0500  Weight: 67.8 kg 65.8 kg 68.9 kg    Scheduled Meds: . carvedilol  3.125 mg Per Tube BID WC  . Chlorhexidine Gluconate Cloth  6 each Topical Q0600  . clopidogrel  75 mg Per Tube Daily  . [START ON 04/18/2020] darbepoetin (ARANESP) injection - DIALYSIS  40 mcg Intravenous Q Wed-HD  . dolutegravir  50 mg Oral Daily  . feeding supplement (NEPRO CARB STEADY)  1,000 mL Per Tube Q24H  . feeding supplement (PROSource TF)  45 mL Per Tube Daily  . heparin injection (subcutaneous)  5,000 Units Subcutaneous Q8H  . insulin aspart  0-9 Units Subcutaneous Q4H  . lamiVUDine  300 mg Per Tube Daily  . levETIRAcetam  500 mg Oral Q12H  . LORazepam  2 mg Intravenous Once  . pantoprazole sodium  40 mg Per Tube Daily  . pravastatin  80 mg Per Tube QHS  . sulfamethoxazole-trimethoprim  1 tablet Oral Once per day on Mon Wed Fri  . tamsulosin  0.4 mg Oral Daily   Continuous Infusions: . levETIRAcetam 500 mg (04/17/20 0914)   PRN Meds:.hydrALAZINE, labetalol  Current Labs: reviewed    Physical Exam:  Blood pressure (!) 152/88, pulse 86, temperature (!) 97.5 F (36.4 C), temperature source Oral, resp. rate (!) 11, weight 68.9 kg, SpO2 96 %. Chronically ill appearing NAD RRR AAO to self, 2012 CTAB S/nt/nd No sig LEE IJ TDC C/D/I   A 1. New ESRD,  1. using TDC, not medically stable for AVF/G;  2. MWF Schedule: 3.5h, 400/600, tight hep, 3K 3. CLIP in process 2. Seizure disorder 3. HTN/Vol 4. CKD-BMD 5. Anemia 6. HIV/AIDs, [facility] notes reviewed  P . HD tomorrow on schedule . Medication Issues; o Preferred narcotic agents for pain control are hydromorphone, fentanyl, and methadone.  Morphine should not be used.  o Baclofen should be avoided o Avoid oral sodium phosphate and magnesium citrate based laxatives / bowel preps    Richard Grippe MD 04/17/2020, 3:10 PM  Recent Labs  Lab 04/13/20 1113 04/13/20 1829 04/14/20 0511 04/14/20 1137 04/14/20 1944 04/15/20 0452 04/16/20 0740  NA 148*  --   --  143  --   --  139  K 4.5  --   --  3.8  --   --  3.5  CL 119*  --   --  110  --   --  103  CO2 12*  --   --  21*  --   --  25  GLUCOSE 68*  --   --  107*  --   --  106*  BUN 94*  --   --  55*  --   --  48*  CREATININE 11.51*  --   --  7.74*  --   --  6.56*  CALCIUM 8.1*  --   --  8.0*  --   --  7.7*  PHOS 7.6*   < >   < >  --  2.5 3.4 3.5   < > = values in this interval not displayed.   Recent Labs  Lab 04/10/20 2037 04/11/20 0018 04/12/20 0434 04/14/20 1137 04/16/20 0740  WBC 6.6   < > 6.5 4.9 4.4  NEUTROABS 4.7  --  5.0 3.6  --   HGB 10.3*   < > 8.2* 9.2* 8.4*  HCT 31.5*   < > 24.5* 27.5* 26.4*  MCV 90.0   < > 88.1 87.0 91.0  PLT 192   < > 173 180 155   < > = values in this interval not displayed.

## 2020-04-17 NOTE — Evaluation (Signed)
Clinical/Bedside Swallow Evaluation Patient Details  Name: Richard [name] MRN: [id number] Date of Birth: 07-11-1956  Today's Date: 04/17/2020 Time: SLP Start Time (ACUTE ONLY): 1315 SLP Stop Time (ACUTE ONLY): 1329 SLP Time Calculation (min) (ACUTE ONLY): 14 min  Past Medical History:  Past Medical History:  Diagnosis Date  . Arthritis   . Diabetes mellitus, type II, insulin dependent ([place])   . GERD (gastroesophageal reflux disease)   . Hypertension   . Hypertensive emergency 05/21/2012  . Immune deficiency disorder ([place])   . Migraines   . Neuropathy in diabetes ([place])    bilat feet  . Ruptured lumbar disc   . Stroke [facility]) 2013   residual right sided weakness (arm>leg)   Past Surgical History:  Past Surgical History:  Procedure Laterality Date  . IR FLUORO GUIDE CV LINE RIGHT  04/13/2020  . IR US GUIDE VASC ACCESS RIGHT  04/13/2020  . KNEE ARTHROSCOPY Bilateral    HPI:  Pt is a 64 y.o. male, with medical history significant of diabetes, osteoarthritis, hypertension, CKD 5, neuropathies, previous CVA with residual right-sided weakness, migraine headaches , patient with extremely poor compliance, he has refused hemodialysis access in the past, who presented to ED via EMS status post seizures witnessed by roommate. In ED patient was postictal, he was loaded with Keppra. CT head negative. BSE 02/14/20: Pt had a little throat clearing initially while drinking thin liquids in a suboptimal position, but once repositioned more fully upright, no further s/s of aspiration were noted. 04/11/20 pt without s/s aspiration; placed on regular/thin. 7/22 change in status; MD paged. Made NPO. To undergo further testing. MRI 7/22: 2 mm acute or early subacute infarct within the left parietal lobe periatrial white matter.   Assessment / Plan / Recommendation Clinical Impression  [name] was seen for a repeat clinical swallow evaluation with great improvement in mentation this date. Pt was  upright in bed asking for food upon SLP arrival. He was alert and oriented. He participated in oral mech exam, which was unremarkable. He consumed thin liquid water via straw in sequential sips without s/s aspiration. He consumed regular solid graham cracker with noted prolonged mastication and multiple swallows (piecemeal deglutition). Pt reports some slowed mastication at baseline, however, given current circumstances of recently being NPO several days and severity of prolonged mastication, recommend dysphagia 3 diet and thin liquids with ST to follow for upgrade as indicated. Pt may also benefit from repeat speech/language/cognitive evaluation as encephalopathy improves.   SLP Visit Diagnosis: Dysphagia, unspecified (R13.10)    Aspiration Risk  Mild aspiration risk    Diet Recommendation Dysphagia 3 (Mech soft);Thin liquid   Liquid Administration via: Cup;Straw Medication Administration: Whole meds with liquid Supervision: Staff to assist with self feeding Compensations: Small sips/bites;Slow rate;Follow solids with liquid Postural Changes: Seated upright at 90 degrees    Other  Recommendations Oral Care Recommendations: Oral care BID;Staff/trained caregiver to provide oral care   Frequency and Duration min 2x/week  2 weeks       Prognosis Prognosis for Safe Diet Advancement: Good      Swallow Study   General Date of Onset: 04/11/20 HPI: Pt is a 64 y.o. male, with medical history significant of diabetes, osteoarthritis, hypertension, CKD 5, neuropathies, previous CVA with residual right-sided weakness, migraine headaches , patient with extremely poor compliance, he has refused hemodialysis access in the past, who presented to ED via EMS status post seizures witnessed by roommate. In ED patient was postictal, he was loaded with  Keppra. CT head negative. BSE 02/14/20: Pt had a little throat clearing initially while drinking thin liquids in a suboptimal position, but once repositioned more  fully upright, no further s/s of aspiration were noted. 04/11/20 pt without s/s aspiration; placed on regular/thin. 7/22 change in status; MD paged. Made NPO. To undergo further testing. MRI 7/22: 2 mm acute or early subacute infarct within the left parietal lobe periatrial white matter. Type of Study: Bedside Swallow Evaluation Previous Swallow Assessment: CSE 04/12/20 made NPO Diet Prior to this Study: NPO Temperature Spikes Noted: No Respiratory Status: Room air History of Recent Intubation: No Behavior/Cognition: Alert;Cooperative Oral Cavity Assessment: Within Functional Limits Oral Care Completed by SLP: Recent completion by staff Oral Cavity - Dentition: Adequate natural dentition Self-Feeding Abilities: Needs assist Patient Positioning: Upright in bed Baseline Vocal Quality: Normal Volitional Cough: Strong Volitional Swallow: Able to elicit    Oral/Motor/Sensory Function Overall Oral Motor/Sensory Function: Within functional limits   Thin Liquid Thin Liquid: Within functional limits Presentation: Straw    Solid     Solid: Within functional limits Presentation: Self Fed Oral Phase Impairments: Impaired mastication Other Comments: slow     [name], M.S., CCC-SLP Speech-Language Pathologist Acute Rehabilitation Services Pager: [place] 04/17/2020,1:30 PM

## 2020-04-17 NOTE — Progress Notes (Signed)
VASCULAR SURGERY:  I have reviewed Dr. Lucianne Lei Dam's note. He is concerned that he could have disseminated cryptococcal infection cryptococcal meningitis.  Patient has a functioning catheter.  I would not recommend placement of permanent access at this time unless he shows significant clinical improvement.  Please reconsult vascular surgery next week if he has shown significant improvement.  Deitra Mayo, MD Office: [phone]

## 2020-04-18 DIAGNOSIS — N186 End stage renal disease: Secondary | ICD-10-CM | POA: Diagnosis not present

## 2020-04-18 DIAGNOSIS — E119 Type 2 diabetes mellitus without complications: Secondary | ICD-10-CM | POA: Diagnosis not present

## 2020-04-18 DIAGNOSIS — R7989 Other specified abnormal findings of blood chemistry: Secondary | ICD-10-CM | POA: Diagnosis not present

## 2020-04-18 DIAGNOSIS — N185 Chronic kidney disease, stage 5: Secondary | ICD-10-CM | POA: Diagnosis not present

## 2020-04-18 DIAGNOSIS — G0490 Encephalitis and encephalomyelitis, unspecified: Secondary | ICD-10-CM

## 2020-04-18 LAB — GLUCOSE, CAPILLARY
Glucose-Capillary: 157 mg/dL — ABNORMAL HIGH (ref 70–99)
Glucose-Capillary: 83 mg/dL (ref 70–99)
Glucose-Capillary: 140 mg/dL — ABNORMAL HIGH (ref 70–99)
Glucose-Capillary: 77 mg/dL (ref 70–99)
Glucose-Capillary: 155 mg/dL — ABNORMAL HIGH (ref 70–99)

## 2020-04-18 LAB — RENAL FUNCTION PANEL
Albumin: 2.2 g/dL — ABNORMAL LOW (ref 3.5–5.0)
Anion gap: 11 (ref 5–15)
BUN: 55 mg/dL — ABNORMAL HIGH (ref 8–23)
CO2: 26 mmol/L (ref 22–32)
Calcium: 7.9 mg/dL — ABNORMAL LOW (ref 8.9–10.3)
Chloride: 100 mmol/L (ref 98–111)
Creatinine, Ser: 5.79 mg/dL — ABNORMAL HIGH (ref 0.61–1.24)
GFR calc Af Amer: 11 mL/min — ABNORMAL LOW (ref >60)
GFR calc non Af Amer: 10 mL/min — ABNORMAL LOW (ref >60)
Glucose, Bld: 132 mg/dL — ABNORMAL HIGH (ref 70–99)
Phosphorus: 2.8 mg/dL (ref 2.5–4.6)
Potassium: 3.6 mmol/L (ref 3.5–5.1)
Sodium: 137 mmol/L (ref 135–145)

## 2020-04-18 LAB — CBC
HCT: 27.2 % — ABNORMAL LOW (ref 39.0–52.0)
Hemoglobin: 8.9 g/dL — ABNORMAL LOW (ref 13.0–17.0)
MCH: 29.6 pg (ref 26.0–34.0)
MCHC: 32.7 g/dL (ref 30.0–36.0)
MCV: 90.4 fL (ref 80.0–100.0)
Platelets: 125 10*3/uL — ABNORMAL LOW (ref 150–400)
RBC: 3.01 MIL/uL — ABNORMAL LOW (ref 4.22–5.81)
RDW: 13.5 % (ref 11.5–15.5)
WBC: 4.6 10*3/uL (ref 4.0–10.5)
nRBC: 0 % (ref 0.0–0.2)

## 2020-04-18 LAB — CSF CULTURE W GRAM STAIN
Culture: NO GROWTH
Gram Stain: NONE SEEN

## 2020-04-18 MED ORDER — HEPARIN SODIUM (PORCINE) 1000 UNIT/ML IJ SOLN
INTRAMUSCULAR | Status: AC
  Administered 2020-04-18: 2000 [IU]
  Filled 2020-04-18: qty 2

## 2020-04-18 MED ORDER — DARBEPOETIN ALFA 40 MCG/0.4ML IJ SOSY
PREFILLED_SYRINGE | INTRAMUSCULAR | Status: AC
  Administered 2020-04-18: 40 ug
  Filled 2020-04-18: qty 0.4

## 2020-04-18 MED ORDER — CLOPIDOGREL BISULFATE 75 MG PO TABS
75.0000 mg | ORAL_TABLET | Freq: Every day | ORAL | Status: DC
  Administered 2020-04-19 – 2020-05-07 (×16): 75 mg via ORAL
  Filled 2020-04-18 (×17): qty 1

## 2020-04-18 MED ORDER — PANTOPRAZOLE SODIUM 40 MG PO TBEC
40.0000 mg | DELAYED_RELEASE_TABLET | Freq: Every day | ORAL | Status: DC
  Administered 2020-04-19 – 2020-05-07 (×15): 40 mg via ORAL
  Filled 2020-04-18 (×18): qty 1

## 2020-04-18 MED ORDER — AMLODIPINE BESYLATE 5 MG PO TABS
5.0000 mg | ORAL_TABLET | Freq: Every day | ORAL | Status: DC
  Administered 2020-04-19 – 2020-04-21 (×3): 5 mg via ORAL
  Filled 2020-04-18 (×4): qty 1

## 2020-04-18 MED ORDER — PRAVASTATIN SODIUM 40 MG PO TABS
80.0000 mg | ORAL_TABLET | Freq: Every day | ORAL | Status: DC
  Administered 2020-04-19 – 2020-05-07 (×19): 80 mg via ORAL
  Filled 2020-04-18 (×20): qty 2

## 2020-04-18 MED ORDER — LAMIVUDINE 150 MG PO TABS
300.0000 mg | ORAL_TABLET | Freq: Every day | ORAL | Status: DC
  Administered 2020-04-19 – 2020-04-28 (×7): 300 mg via ORAL
  Filled 2020-04-18 (×11): qty 2

## 2020-04-18 MED ORDER — CARVEDILOL 3.125 MG PO TABS
3.1250 mg | ORAL_TABLET | Freq: Two times a day (BID) | ORAL | Status: DC
  Administered 2020-04-18 – 2020-05-07 (×30): 3.125 mg via ORAL
  Filled 2020-04-18 (×33): qty 1

## 2020-04-18 MED ORDER — SODIUM CHLORIDE 0.9 % IV SOLN
125.0000 mg | INTRAVENOUS | Status: DC
  Administered 2020-04-18 – 2020-05-03 (×3): 125 mg via INTRAVENOUS
  Filled 2020-04-18 (×11): qty 10

## 2020-04-18 MED ORDER — NEPRO/CARBSTEADY PO LIQD
237.0000 mL | Freq: Three times a day (TID) | ORAL | Status: DC
  Administered 2020-04-18 – 2020-05-06 (×38): 237 mL via ORAL

## 2020-04-18 NOTE — Progress Notes (Signed)
Admit: 04/10/2020 LOS: 8  63M new ESRD, seizures, HIV/AIDS  Subjective:  . NAEON . No c/o   07/27 0701 - 07/28 0700 In: 1540.8 [P.O.:454; I.V.:1; NG/GT:985.8; IV Piggyback:100] Out: -   Filed Weights   04/16/20 0730 04/16/20 1110 04/17/20 0500  Weight: 67.8 kg 65.8 kg 68.9 kg    Scheduled Meds: . carvedilol  3.125 mg Oral BID WC  . Chlorhexidine Gluconate Cloth  6 each Topical Q0600  . [START ON 04/19/2020] clopidogrel  75 mg Oral Daily  . darbepoetin (ARANESP) injection - DIALYSIS  40 mcg Intravenous Q Wed-HD  . dolutegravir  50 mg Oral Daily  . feeding supplement (NEPRO CARB STEADY)  1,000 mL Per Tube Q24H  . feeding supplement (PROSource TF)  45 mL Per Tube Daily  . heparin injection (subcutaneous)  5,000 Units Subcutaneous Q8H  . insulin aspart  0-9 Units Subcutaneous Q4H  . [START ON 04/19/2020] lamiVUDine  300 mg Oral Daily  . levETIRAcetam  500 mg Oral Q12H  . LORazepam  2 mg Intravenous Once  . pantoprazole  40 mg Oral Daily  . pravastatin  80 mg Oral QHS  . sulfamethoxazole-trimethoprim  1 tablet Oral Once per day on Mon Wed Fri  . tamsulosin  0.4 mg Oral Daily   Continuous Infusions: . levETIRAcetam 500 mg (04/17/20 0914)   PRN Meds:.hydrALAZINE, labetalol  Current Labs: reviewed  Results for Richard, [name] (MRN [id number]) as of 04/18/2020 12:10  Ref. Range 04/13/2020 11:13  Saturation Ratios Latest Ref Range: 17.9 - 39.5 % 9 (L)  Ferritin Latest Ref Range: 24 - 336 ng/mL 1,130 (H)    Physical Exam:  Blood pressure (!) 177/90, pulse 90, temperature 98 F (36.7 C), temperature source Oral, resp. rate 18, weight 68.9 kg, SpO2 100 %. Chronically ill appearing NAD RRR AAO to self, 2012 CTAB S/nt/nd No sig LEE IJ TDC C/D/I   A 1. New ESRD,  1. using TDC, not medically stable for AVF/G; VVS following 2. MWF Schedule: 3.5h, 400/600, tight hep, 3K 3. CLIP in process 2. Seizure disorder 3. HTN/Vol, BPs up 4. CKD-BMD 5. Anemia, on ESA, Start  Fe 6. HIV/AIDs, [facility] notes reviewed  P . HD today on schedule . Add amlodipine for BP control . Start IV Fe for Hb . Medication Issues; o Preferred narcotic agents for pain control are hydromorphone, fentanyl, and methadone. Morphine should not be used.  o Baclofen should be avoided o Avoid oral sodium phosphate and magnesium citrate based laxatives / bowel preps    [name] MD 04/18/2020, 12:10 PM  Recent Labs  Lab 04/14/20 1137 04/14/20 1944 04/15/20 0452 04/16/20 0740 04/18/20 0343  NA 143  --   --  139 137  K 3.8  --   --  3.5 3.6  CL 110  --   --  103 100  CO2 21*  --   --  25 26  GLUCOSE 107*  --   --  106* 132*  BUN 55*  --   --  48* 55*  CREATININE 7.74*  --   --  6.56* 5.79*  CALCIUM 8.0*  --   --  7.7* 7.9*  PHOS  --    < > 3.4 3.5 2.8   < > = values in this interval not displayed.   Recent Labs  Lab 04/12/20 0434 04/12/20 0434 04/14/20 1137 04/16/20 0740 04/18/20 0343  WBC 6.5   < > 4.9 4.4 4.6  NEUTROABS 5.0  --  3.6  --   --  HGB 8.2*   < > 9.2* 8.4* 8.9*  HCT 24.5*   < > 27.5* 26.4* 27.2*  MCV 88.1   < > 87.0 91.0 90.4  PLT 173   < > 180 155 125*   < > = values in this interval not displayed.

## 2020-04-18 NOTE — Progress Notes (Signed)
Nutrition Follow-up  DOCUMENTATION CODES:   Non-severe (moderate) malnutrition in context of chronic illness  INTERVENTION:  Nepro Shake po TID, each supplement provides 425 kcal and 19 grams protein   Magic cup TID with meals, each supplement provides 290 kcal and 9 grams of protein   NUTRITION DIAGNOSIS:   Moderate Malnutrition related to chronic illness (CKDV/ESRD starting HD) as evidenced by mild muscle depletion, mild fat depletion.  Progressing, pt now on dysphagia 3 with thin liquids  GOAL:   Patient will meet greater than or equal to 90% of their needs  Progressing  MONITOR:   Supplement acceptance, PO intake, Diet advancement, Weight trends, Labs, I & O's  REASON FOR ASSESSMENT:   Consult Enteral/tube feeding initiation and management  ASSESSMENT:   Pt admitted with acute metabolic encephalopathy, likely mulitfactorial including seizure, HTN, acute stroke, uremia, and possible CNS infection. PMH includes DM, OA, HTN, CKD V, neuropathies, previous CVA, hx seizures, and poor compliance (has previously refused dialysis and is noncompliant with medications). Pt now has ESRD with acidosis and some uremia and has agreed to beginning dialysis.   7/23 - Cortrak placed (gastric); s/p tunneled dialysis catheter placement  7/24 - s/p LP, negative cryptococcal antigen 7/27 - diet advanced to dysphagia 3 with thin liquids  Pt to d/c to SNF. Discussed pt with RN.    Pt sleeping at time of RD visit and did not wake to RD voice or during nutrition-focused physical exam. Pt did speak to Cortrak RD, however, and stated that he was willing to do "whatever it takes" to not have a feeding tube. Pt expressed preference for butter pecan flavor supplements.    Pt was receiving Nepro 1.8 cal @ 14ml/hr from 1200-2000 and 46ml Prosource TF daily via Cortrak; however, RD notified that feeding tube became clogged. Cortrak team made multiple attempts to unclog feeding tube at bedside, but was  unable to do so. Feeding tube removed this morning. Of note, pt's diet was advanced to a dysphagia 3 diet with thin liquids yesterday and pt ate 100% of documented meals. Will not replace tube at this moment. Will order oral nutrition supplements to help pt meet his needs po.   Per wt readings, pt weighed 74.7 kg on 09/14/19. Pt now weighs 68.9 kg. This indicates a 7.5% wt loss x 7 months, which is insignificant for time frame.   Labs: CBGs 104-157 Medications: Aranesp, Novolog, Protonix  NUTRITION - FOCUSED PHYSICAL EXAM:    Most Recent Value  Orbital Region Mild depletion  Upper Arm Region Mild depletion  Thoracic and Lumbar Region Mild depletion  Buccal Region Mild depletion  Temple Region Mild depletion  Clavicle Bone Region Mild depletion  Clavicle and Acromion Bone Region Mild depletion  Scapular Bone Region Mild depletion  Dorsal Hand Mild depletion  Patellar Region Severe depletion  Anterior Thigh Region Severe depletion  Posterior Calf Region Severe depletion  Edema (RD Assessment) None  Hair Reviewed  Eyes Reviewed  Mouth Reviewed  Skin Reviewed  Nails Reviewed       Diet Order:   Diet Order            DIET DYS 3 Room service appropriate? Yes; Fluid consistency: Thin  Diet effective now                 EDUCATION NEEDS:   No education needs have been identified at this time  Skin:  Skin Assessment: Reviewed RN Assessment  Last BM:  7/27 type 5  Height:  Ht Readings from Last 1 Encounters:  09/14/19 6' (1.829 m)    Weight:   Wt Readings from Last 3 Encounters:  04/17/20 68.9 kg  02/13/20 68.4 kg  09/14/19 74.7 kg    BMI:  Body mass index is 20.61 kg/m.  Estimated Nutritional Needs:   Kcal:  7505-1833  Protein:  115-130 grams  Fluid:  109ml + UOP    Larkin Ina, MS, RD, LDN RD pager number and weekend/on-call pager number located in [place].

## 2020-04-18 NOTE — Progress Notes (Signed)
Clog in tube feed on arrival to shift, paged Cortrak

## 2020-04-18 NOTE — Progress Notes (Signed)
PROGRESS NOTE    Richard [name]  OJJ:[id number] DOB: Jun 28, 1956 DOA: 04/10/2020 PCP: [name], MD   Brief Narrative:  HPI On 04/10/2020 by Dr. Phillips Climes Richard [name]  is a 64 y.o. male, with medical history significant ofdiabetes, osteoarthritis, hypertension,CKD 5,neuropathies, previous CVA with residual right-sided weakness, migraine headaches , patient with extremely poor compliance, he has refused hemodialysis access in the past, presents to ED via EMS status post seizures, he is known history of seizures, but has not been noncompliant with his seizure medications, seizures was witnessed by his roommate, seizures lasted for 4 minutes, she will patient's extremely encephalopathic, unable to provide any history, but upon my evaluation is more appropriate, patient report actually he never got his seizure medications, was not taking it, patient denies any fever, chest pain, shortness of breath, no dysuria or polyuria, reports usually ambulates with a walker, he has right-sided weakness at baseline.  Interim history: Admitted for AMS, found to have CVA and seizure. Neuro was consulted. Currently on AED. Vascular consulted for access placement as patient recently started HD for ESRD. Nephro continues to follow for dialysis.  NG tube removed on the 28th, tolerating diet continue to advance. Currently awaiting placement.   Assessment & Plan   Acute metabolic encephalopathy, multifactorial, POA, improving -likely multifactorial including seizure, hypertension, acute stroke, uremia -Patient continues to be somewhat somnolent but markedly improving from previous documentation  Acute CVA -Also with history of CVA with residual right-sided weakness and chronic right upper extremity contracture -Initial CT scan on admission no acute findings -Due to acute mental status change on the morning of July 22, code stroke initiated, stat CT scan no acute findings, stat MRI showed  "2 mm acute or early subacute infarct within the left parietal lobe periatrial white matter" -HIV screening positive -s/p Lumbar puncture on 7/24, CSF Gram stain negative. Cryptococcus PCR negative. VDRL CSF is pending. -PT, OT recommending SNF -Core track removed, now tolerating p.o. per speech continue to advance diet -Neurology consulted and appreciate insight and recommendations -Continue Plavix, statin  Recurrent seizures in the setting of noncomlpiance, POA - He is known to be noncompliant with his seizure medication - EEG July 24 "This study is suggestive of moderate diffuse encephalopathy, nonspecific etiology.No seizures or epileptiform discharges were seen throughout the recording" -s/p  Lumbar puncture on 7/24, CSF Gram stain negative. Cryptococcus PCR negative. VDRL CSF is pending. -Previously on IV Keppra and IV Vimpat , now on iv keppra only per neurology recommendation -Appreciate neurology recommendation  Hypertensive urgency, present on admission -Resolved -Currently well controlled on Coreg  Chronic kidney disease, stage V now progressing to ESRD  -Creatinine in 10-11 range, creatinine appears to be at baseline. -Patient has refused dialysis in the past, agreeable to dialysis on presentation -Nephrology consulted and appreciated -IR placed dialysis catheter, tolerating well -Currently tolerating hemodialysis well - started on 04/13/2020 -Vascular surgery consulted and appreciated for placement of access  Anemia of chronic disease -hemoglobin stable, continue to monitor CBC  HIV/AIDS, newly diagnosed  -CD4 count 39, viral load 624,000 -New diagnosis -Infectious disease consulted and appreciated-continue tivicay, epivir -Currently on bactrim  Dark skin lesions -nontender, does not itch -molluscum contagiosum ? Kaposi sarcoma? -ID requesting possible surgical/punch biopsy, will sideline surgery.  Diabetes mellitus, type II with episode of  hypoglycemia -Trolled, hemoglobin A1c 5.2 (02/14/2020) -On Novolin 70/30 at home -Had hypoglycemia requiring D5 infusion briefly on 7/23,  -Continue insulin sliding scale with CBG monitoring  Hyperlipidemia -Total cholesterol 147,  HDL 31, LDL 91, triglycerides 125 -Continue statin  Tobacco use -Cessation discussed by previous physician   History of severe noncompliance -Per previous documentation, patient counseled  Goals of care discussion -Palliative care consulted and appreciated  DVT Prophylaxis  heaprin  Code Status: Full  Family Communication: None at bedside  Disposition Plan:  Status is: Inpatient  Remains inpatient appropriate because:Altered mental status, IV treatments appropriate due to intensity of illness or inability to take PO and Inpatient level of care appropriate due to severity of illness   Dispo: The patient is from: Home              Anticipated d/c is to: SNF              Anticipated d/c date is: > 3 days              Patient currently is not medically stable to d/c.   Consultants Nephrology Neurology Advanced radiology Infectious disease Palliative care  Procedures  Bedside lumbar puncture attempted July 24 by neurology Lumbar puncture by IR on July 24 Dialysis catheter placement by interventional radiology   Antibiotics   Anti-infectives (From admission, onward)   Start     Dose/Rate Route Frequency Ordered Stop   04/17/20 0000  Dolutegravir-lamiVUDine (DOVATO) 50-300 MG TABS     Discontinue     1 tablet Oral Daily 04/17/20 1546     04/16/20 1000  lamiVUDine (EPIVIR) tablet 300 mg     Discontinue    Note to Pharmacy: I DO NOT renally dose lamivudine and I have never had problems NOT adjusting it. I want pt to be on Dovato but still not on formulary here (unfortunately)   300 mg Per Tube Daily 04/15/20 1307     04/16/20 0900  sulfamethoxazole-trimethoprim (BACTRIM DS) 800-160 MG per tablet 1 tablet     Discontinue     1 tablet Oral  Once per day on Mon Wed Fri 04/15/20 1350     04/15/20 1245  dolutegravir (TIVICAY) tablet 50 mg     Discontinue     50 mg Oral Daily 04/15/20 1202     04/15/20 1245  lamiVUDine (EPIVIR) tablet 300 mg  Status:  Discontinued       Note to Pharmacy: I DO NOT renally dose lamivudine and I have never had problems NOT adjusting it. I want pt to be on Dovato but still not on formulary here (unfortunately)   300 mg Oral Daily 04/15/20 1202 04/15/20 1307   04/13/20 1737  ceFAZolin (ANCEF) 2-4 GM/100ML-% IVPB       Note to Pharmacy: Desiree Hane   : cabinet override      04/13/20 1737 04/14/20 0544   04/13/20 0000  ceFAZolin (ANCEF) IVPB 2g/100 mL premix        2 g 200 mL/hr over 30 Minutes Intravenous To Radiology 04/12/20 1454 04/13/20 0834      Subjective:   No acute issues or events overnight, denies nausea, vomiting, diarrhea, constipation, headache, fevers, chills.  Review of systems somewhat limited due to patient's somnolence but certainly more awake than previous.  Objective:   Vitals:   04/17/20 2320 04/18/20 0000 04/18/20 0400 04/18/20 0500  BP: (!) 190/94 (!) 167/69 (!) 169/86 (!) 165/73  Pulse: 87  85   Resp: 15  17   Temp: 98 F (36.7 C)  98.3 F (36.8 C)   TempSrc: Oral  Oral   SpO2: 100%  100%   Weight:  Intake/Output Summary (Last 24 hours) at 04/18/2020 0718 Last data filed at 04/18/2020 0400 Gross per 24 hour  Intake 1540.83 ml  Output --  Net 1540.83 ml   Filed Weights   04/16/20 0730 04/16/20 1110 04/17/20 0500  Weight: 67.8 kg 65.8 kg 68.9 kg   Exam  General: Well developed, chronically ill appearing, NAD  HEENT: NCAT,  mucous membranes moist. Cortrak   Cardiovascular: S1 S2 auscultated, RRR.  Respiratory: Clear to auscultation bilaterally   Abdomen: Soft, nontender, nondistended, + bowel sounds  Extremities: warm dry without cyanosis clubbing or edema, RUE contracture  Neuro: Sleepy, can answers some questions.   Skin: Diffuse  rash  Data Reviewed: I have personally reviewed following labs and imaging studies  CBC: Recent Labs  Lab 04/12/20 0434 04/14/20 1137 04/16/20 0740 04/18/20 0343  WBC 6.5 4.9 4.4 4.6  NEUTROABS 5.0 3.6  --   --   HGB 8.2* 9.2* 8.4* 8.9*  HCT 24.5* 27.5* 26.4* 27.2*  MCV 88.1 87.0 91.0 90.4  PLT 173 180 155 220*   Basic Metabolic Panel: Recent Labs  Lab 04/12/20 0434 04/12/20 0434 04/13/20 1113 04/13/20 1113 04/13/20 1829 04/13/20 1829 04/14/20 0511 04/14/20 1137 04/14/20 1944 04/15/20 0452 04/16/20 0740 04/18/20 0343  NA 143  --  148*  --   --   --   --  143  --   --  139 137  K 4.4  --  4.5  --   --   --   --  3.8  --   --  3.5 3.6  CL 116*  --  119*  --   --   --   --  110  --   --  103 100  CO2 13*  --  12*  --   --   --   --  21*  --   --  25 26  GLUCOSE 88  --  68*  --   --   --   --  107*  --   --  106* 132*  BUN 86*  --  94*  --   --   --   --  55*  --   --  48* 55*  CREATININE 10.66*  --  11.51*  --   --   --   --  7.74*  --   --  6.56* 5.79*  CALCIUM 7.5*   < > 8.1*  --   --   --  7.7* 8.0*  --   --  7.7* 7.9*  MG  --   --   --   --  1.6*  --  1.5*  --  1.6* 2.2  --   --   PHOS  --   --  7.6*   < > 8.3*   < > 4.4  --  2.5 3.4 3.5 2.8   < > = values in this interval not displayed.   GFR: CrCl cannot be calculated (Unknown ideal weight.). Liver Function Tests: Recent Labs  Lab 04/13/20 1113 04/14/20 1137 04/16/20 0740 04/18/20 0343  AST  --  19  --   --   ALT  --  8  --   --   ALKPHOS  --  79  --   --   BILITOT  --  0.4  --   --   PROT  --  6.1*  --   --   ALBUMIN 2.3* 2.3* 2.1* 2.2*   No results for input(s):  LIPASE, AMYLASE in the last 168 hours. No results for input(s): AMMONIA in the last 168 hours. Coagulation Profile: Recent Labs  Lab 04/13/20 1113  INR 1.1   Cardiac Enzymes: No results for input(s): CKTOTAL, CKMB, CKMBINDEX, TROPONINI in the last 168 hours. BNP (last 3 results) Recent Labs    06/22/19 1505  PROBNP 170.0*    HbA1C: No results for input(s): HGBA1C in the last 72 hours. CBG: Recent Labs  Lab 04/17/20 1200 04/17/20 1556 04/17/20 1944 04/17/20 2323 04/18/20 0412  GLUCAP 104* 124* 119* 104* 157*   Lipid Profile: No results for input(s): CHOL, HDL, LDLCALC, TRIG, CHOLHDL, LDLDIRECT in the last 72 hours. Thyroid Function Tests: No results for input(s): TSH, T4TOTAL, FREET4, T3FREE, THYROIDAB in the last 72 hours. Anemia Panel: No results for input(s): VITAMINB12, FOLATE, FERRITIN, TIBC, IRON, RETICCTPCT in the last 72 hours. Urine analysis:    Component Value Date/Time   COLORURINE STRAW (A) 04/11/2020 0223   APPEARANCEUR CLEAR 04/11/2020 0223   LABSPEC 1.011 04/11/2020 0223   PHURINE 5.0 04/11/2020 0223   GLUCOSEU 50 (A) 04/11/2020 0223   HGBUR SMALL (A) 04/11/2020 0223   BILIRUBINUR NEGATIVE 04/11/2020 0223   KETONESUR NEGATIVE 04/11/2020 0223   PROTEINUR >=300 (A) 04/11/2020 0223   UROBILINOGEN 0.2 11/29/2014 2003   NITRITE NEGATIVE 04/11/2020 0223   LEUKOCYTESUR NEGATIVE 04/11/2020 0223   Sepsis Labs: @LABRCNTIP (procalcitonin:4,lacticidven:4)  ) Recent Results (from the past 240 hour(s))  SARS Coronavirus 2 by RT PCR (hospital order, performed in [facility] hospital lab) Nasopharyngeal Nasopharyngeal Swab     Status: None   Collection Time: 04/11/20  3:58 AM   Specimen: Nasopharyngeal Swab  Result Value Ref Range Status   SARS Coronavirus 2 NEGATIVE NEGATIVE Final    Comment: (NOTE) SARS-CoV-2 target nucleic acids are NOT DETECTED.  The SARS-CoV-2 RNA is generally detectable in upper and lower respiratory specimens during the acute phase of infection. The lowest concentration of SARS-CoV-2 viral copies this assay can detect is 250 copies / mL. A negative result does not preclude SARS-CoV-2 infection and should not be used as the sole basis for treatment or other patient management decisions.  A negative result may occur with improper specimen collection / handling,  submission of specimen other than nasopharyngeal swab, presence of viral mutation(s) within the areas targeted by this assay, and inadequate number of viral copies (<250 copies / mL). A negative result must be combined with clinical observations, patient history, and epidemiological information.  Fact Sheet for Patients:   StrictlyIdeas.no  Fact Sheet for Healthcare Providers: BankingDealers.co.za  This test is not yet approved or  cleared by the [place] FDA and has been authorized for detection and/or diagnosis of SARS-CoV-2 by FDA under an Emergency Use Authorization (EUA).  This EUA will remain in effect (meaning this test can be used) for the duration of the COVID-19 declaration under Section 564(b)(1) of the Act, 21 U.S.C. section 360bbb-3(b)(1), unless the authorization is terminated or revoked sooner.  Performed at [facility], [place] [address] [place], [place] [address]   Culture, blood (routine x 2)     Status: None (Preliminary result)   Collection Time: 04/14/20 11:31 AM   Specimen: BLOOD RIGHT HAND  Result Value Ref Range Status   Specimen Description BLOOD RIGHT HAND  Final   Special Requests   Final    BOTTLES DRAWN AEROBIC ONLY Blood Culture results may not be optimal due to an inadequate volume of blood received in culture bottles  Culture   Final    NO GROWTH 3 DAYS Performed at [facility], [place] [address] [place], [place] [address]    Report Status PENDING  Incomplete  Culture, blood (routine x 2)     Status: None (Preliminary result)   Collection Time: 04/14/20 11:36 AM   Specimen: BLOOD RIGHT HAND  Result Value Ref Range Status   Specimen Description BLOOD RIGHT HAND  Final   Special Requests   Final    BOTTLES DRAWN AEROBIC ONLY Blood Culture results may not be optimal due to an inadequate volume of blood received in culture bottles   Culture   Final    NO GROWTH 3 DAYS Performed at  [facility], [place] [address], [place], [place] [address]    Report Status PENDING  Incomplete  CSF culture     Status: None (Preliminary result)   Collection Time: 04/14/20  6:27 PM   Specimen: PATH Cytology CSF; Cerebrospinal Fluid  Result Value Ref Range Status   Specimen Description CSF  Final   Special Requests NONE  Final   Gram Stain NO WBC SEEN NO ORGANISMS SEEN CYTOSPIN SMEAR   Final   Culture   Final    NO GROWTH 3 DAYS Performed at [facility], [address] [place], [place] [address]    Report Status PENDING  Incomplete  Fungus Stain     Status: None   Collection Time: 04/14/20  6:27 PM   Specimen: PATH Cytology CSF; Cerebrospinal Fluid  Result Value Ref Range Status   FUNGUS STAIN Final report  Final    Comment: (NOTE) Performed At: [facility] [address] [place], [place] [id number] Rush Farmer MD WY:[id number]    Fungal Source CSF  Final    Comment: Performed at [facility], [place] [address], [place], [place] [address]  Fungal Stain reflex     Status: None   Collection Time: 04/14/20  6:27 PM  Result Value Ref Range Status   Fungal stain result 1 Comment  Final    Comment: (NOTE) KOH/Calcofluor preparation:  no fungus observed. Performed At: [facility] [place], [place] [id number] Rush Farmer MD NO:[id number]       Radiology Studies: No results found.   Scheduled Meds: . carvedilol  3.125 mg Per Tube BID WC  . Chlorhexidine Gluconate Cloth  6 each Topical Q0600  . clopidogrel  75 mg Per Tube Daily  . darbepoetin (ARANESP) injection - DIALYSIS  40 mcg Intravenous Q Wed-HD  . dolutegravir  50 mg Oral Daily  . feeding supplement (NEPRO CARB STEADY)  1,000 mL Per Tube Q24H  . feeding supplement (PROSource TF)  45 mL Per Tube Daily  . heparin injection (subcutaneous)  5,000 Units Subcutaneous Q8H  . insulin aspart  0-9 Units Subcutaneous Q4H  . lamiVUDine  300 mg Per Tube Daily  .  levETIRAcetam  500 mg Oral Q12H  . LORazepam  2 mg Intravenous Once  . pantoprazole sodium  40 mg Per Tube Daily  . pravastatin  80 mg Per Tube QHS  . sulfamethoxazole-trimethoprim  1 tablet Oral Once per day on Mon Wed Fri  . tamsulosin  0.4 mg Oral Daily   Continuous Infusions: . levETIRAcetam 500 mg (04/17/20 0914)     LOS: 8 days   Time Spent in minutes   30 minutes  Little Ishikawa D.O. on 04/18/2020 at 7:18 AM  After 7pm go to [email or web address]

## 2020-04-18 NOTE — NC FL2 (Signed)
[place] LEVEL OF CARE SCREENING TOOL     IDENTIFICATION  Patient Name: Richard [name] Birthdate: 02/20/1956 Sex: male Admission Date (Current Location): 04/10/2020  [facility] and [place] Number:  Herbalist and Address:  [facility], [place] [address] [place], [place] [address]      Provider Number: [id number]  Attending Physician Name and Address:  Little Ishikawa, MD  Relative Name and Phone Number:       Current Level of Care: Hospital Recommended Level of Care: [place] Prior Approval Number:    Date Approved/Denied:   PASRR Number: [id number]  Discharge Plan: SNF    Current Diagnoses: Patient Active Problem List   Diagnosis Date Noted  . Creatinine elevation   . Goals of care, counseling/discussion   . Hypertensive urgency   . Meningoencephalitis   . Palliative care by specialist   . ESRD (end stage renal disease) ([place])   . AIDS (acquired immune deficiency syndrome) ([place]) 04/13/2020  . Encephalopathy 04/13/2020  . Seizure ([place]) 04/10/2020  . History of cerebrovascular accident (CVA) with residual deficit 03/19/2020  . CKD (chronic kidney disease) stage 5, GFR less than 15 ml/min (HCC) 02/15/2020  . Altered mental status 02/13/2020  . Neuropathy in diabetes ([place])   . Migraines   . Hypertension   . GERD (gastroesophageal reflux disease)   . Diabetes mellitus, type II, insulin dependent ([place])   . Arthritis   . Hyperlipidemia 08/25/2012  . Chronic kidney disease, stage 4 (severe) ([place]) 08/23/2012  . Tobacco abuse 08/23/2012  . Protein-calorie malnutrition, mild ([place]) 08/23/2012  . 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 05/21/2012    Orientation RESPIRATION BLADDER Height & Weight     Self  Normal Incontinent Weight: 152 lb (68.9 kg) Height:     BEHAVIORAL SYMPTOMS/MOOD NEUROLOGICAL BOWEL NUTRITION STATUS    Convulsions/Seizures Incontinent Diet (see DC  summary)  AMBULATORY STATUS COMMUNICATION OF NEEDS Skin   Extensive Assist Verbally Normal                       Personal Care Assistance Level of Assistance  Bathing, Feeding, Dressing Bathing Assistance: Maximum assistance Feeding assistance: Maximum assistance Dressing Assistance: Maximum assistance     Functional Limitations Info  Speech     Speech Info: Impaired (dysarthria)    SPECIAL CARE FACTORS FREQUENCY  PT (By licensed PT), OT (By licensed OT), Speech therapy     PT Frequency: 5x/wk OT Frequency: 5x/wk     Speech Therapy Frequency: 5x/wk      Contractures Contractures Info: Not present    Additional Factors Info  Code Status, Allergies, Insulin Sliding Scale Code Status Info: Full Allergies Info: NKA   Insulin Sliding Scale Info: 0-9 units 3x/day with meals       Current Medications (04/18/2020):  This is the current hospital active medication list Current Facility-Administered Medications  Medication Dose Route Frequency Provider Last Rate Last Admin  . amLODipine (NORVASC) tablet 5 mg  5 mg Oral Daily [name] B, MD      . carvedilol (COREG) tablet 3.125 mg  3.125 mg Oral BID WC Llana Aliment, RPH      . Chlorhexidine Gluconate Cloth 2 % PADS 6 each  6 each Topical Q0600 Rosita Fire, MD   6 each at 04/18/20 [phone]  . [START ON 04/19/2020] clopidogrel (PLAVIX) tablet 75 mg  75 mg Oral Daily Llana Aliment,  RPH      . Darbepoetin Alfa (ARANESP) injection 40 mcg  40 mcg Intravenous Q Wed-HD Elmarie Shiley, MD      . dolutegravir (TIVICAY) tablet 50 mg  50 mg Oral Daily Florencia Reasons, MD   50 mg at 04/18/20 1056  . feeding supplement (NEPRO CARB STEADY) liquid 237 mL  237 mL Oral TID BM Little Ishikawa, MD      . ferric gluconate (NULECIT) 125 mg in sodium chloride 0.9 % 100 mL IVPB  125 mg Intravenous Q M,W,F-HD [name] B, MD      . heparin injection 5,000 Units  5,000 Units Subcutaneous Camelia Phenes Florencia Reasons, MD   5,000 Units at 04/18/20 1400   . hydrALAZINE (APRESOLINE) injection 10 mg  10 mg Intravenous Q6H PRN Elgergawy, Silver Huguenin, MD   10 mg at 04/18/20 0437  . insulin aspart (novoLOG) injection 0-9 Units  0-9 Units Subcutaneous Q4H Florencia Reasons, MD   1 Units at 04/18/20 1303  . labetalol (NORMODYNE) injection 5 mg  5 mg Intravenous Q2H PRN Florencia Reasons, MD   5 mg at 04/17/20 2348  . [START ON 04/19/2020] lamiVUDine (EPIVIR) tablet 300 mg  300 mg Oral Daily Llana Aliment, RPH      . levETIRAcetam (KEPPRA) IVPB 500 mg/100 mL premix  500 mg Intravenous Q12H Donnetta Simpers, MD 400 mL/hr at 04/17/20 0914 500 mg at 04/17/20 0914   Or  . levETIRAcetam (KEPPRA) tablet 500 mg  500 mg Oral Q12H Donnetta Simpers, MD   500 mg at 04/18/20 1044  . LORazepam (ATIVAN) injection 2 mg  2 mg Intravenous Once Marliss Coots, PA-C      . pantoprazole (PROTONIX) EC tablet 40 mg  40 mg Oral Daily Llana Aliment, RPH      . pravastatin (PRAVACHOL) tablet 80 mg  80 mg Oral QHS Llana Aliment, RPH      . sulfamethoxazole-trimethoprim (BACTRIM DS) 800-160 MG per tablet 1 tablet  1 tablet Oral Once per day on Mon Wed Fri Tommy Medal, Lavell Islam, MD   1 tablet at 04/16/20 1211  . tamsulosin (FLOMAX) capsule 0.4 mg  0.4 mg Oral Daily Florencia Reasons, MD   0.4 mg at 04/18/20 1052     Discharge Medications: Please see discharge summary for a list of discharge medications.  Relevant Imaging Results:  Relevant Lab Results:   Additional Information SS#: [id number]  Geralynn Ochs, LCSW

## 2020-04-18 NOTE — Progress Notes (Signed)
Cortrak Tube Team Note:  RD received page regarding clogged Cortrak tube. Multiple attempts to unclog tube unsuccessful.   Discussed plan with RN. Diet advanced yesterday and pt tolerating well with good appetite. Eating 100% of meals and open to oral nutrition supplements. Discussed possibility of removing Cortrak and not replacing at this time with plan to start calorie count to better assess po intake and adding supplements to optimize po intake. RD following patient and MD both agreeable to this plan  Cortrak and bridle removed.   Kerman Passey MS, RDN, LDN, CNSC Registered Dietitian III Clinical Nutrition RD Pager and On-Call Pager Number Located in [place]

## 2020-04-18 NOTE — TOC Initial Note (Signed)
Transition of Care [facility]) - Initial/Assessment Note    Patient Details  Name: Richard [name] MRN: [id number] Date of Birth: September 04, 1956  Transition of Care [facility]) CM/SW Contact:    Richard Ochs, LCSW Phone Number: 04/18/2020, 3:52 PM  Clinical Narrative:      CSW spoke with patient's daughter, Richard [name], to discuss SNF placement. [name] agreeable to SNF, but will need information on options. CSW to fax out referral and will follow with CMS choice list of bed offers within network of patient's insurance. CSW notified Renal Navigator of plan for SNF, so that once a SNF bed is obtained the patient can be coordinated with outpatient dialysis center. CSW to follow.             Expected Discharge Plan: Skilled Nursing Facility Barriers to Discharge: Continued Medical Work up, Waiting for outpatient dialysis   Patient Goals and CMS Choice Patient states their goals for this hospitalization and ongoing recovery are:: patient unable to participate in goal setting at this time due to disorientation CMS Medicare.gov Compare Post Acute Care list provided to:: Patient Represenative (must comment) Choice offered to / list presented to : Adult Children  Expected Discharge Plan and Services Expected Discharge Plan: [place] Choice: [place] arrangements for the past 2 months: Single Family Home                                      Prior Living Arrangements/Services Living arrangements for the past 2 months: Single Family Home Lives with:: Adult Children Patient language and need for interpreter reviewed:: No Do you feel safe going back to the place where you live?: Yes      Need for Family Participation in Patient Care: Yes (Comment) Care giver support system in place?: No (comment) Current home services: DME Criminal Activity/Legal Involvement Pertinent to Current Situation/Hospitalization: No - Comment as  needed  Activities of Daily Living      Permission Sought/Granted Permission sought to share information with : Facility Sport and exercise psychologist, Family Supports Permission granted to share information with : Yes, Verbal Permission Granted  Share Information with NAME: Richard [name]  Permission granted to share info w AGENCY: SNF  Permission granted to share info w Relationship: Daughter     Emotional Assessment   Attitude/Demeanor/Rapport: Unable to Assess Affect (typically observed): Unable to Assess Orientation: : Oriented to Self Alcohol / Substance Use: Not Applicable Psych Involvement: No (comment)  Admission diagnosis:  Seizure ([place]) [R56.9] Hypertensive urgency [I16.0] Creatinine elevation [R79.89] Patient Active Problem List   Diagnosis Date Noted  . Creatinine elevation   . Goals of care, counseling/discussion   . Hypertensive urgency   . Meningoencephalitis   . Palliative care by specialist   . ESRD (end stage renal disease) ([place])   . AIDS (acquired immune deficiency syndrome) ([place]) 04/13/2020  . Encephalopathy 04/13/2020  . Seizure ([place]) 04/10/2020  . History of cerebrovascular accident (CVA) with residual deficit 03/19/2020  . CKD (chronic kidney disease) stage 5, GFR less than 15 ml/min (HCC) 02/15/2020  . Altered mental status 02/13/2020  . Neuropathy in diabetes ([place])   . Migraines   . Hypertension   . GERD (gastroesophageal reflux disease)   . Diabetes mellitus, type II, insulin dependent ([place])   . Arthritis   . Hyperlipidemia 08/25/2012  . Chronic kidney disease, stage 4 (severe) ([place]) 08/23/2012  .  Tobacco abuse 08/23/2012  . Protein-calorie malnutrition, mild ([place]) 08/23/2012  . 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 05/21/2012   PCP:  Richard Macadam, MD Pharmacy:   [id number] [address], [place] [address] Phone: [phone] Fax: [phone]  [place], [place] - [address] [place] [address] Phone: [phone] Fax: [phone]  [place], [place] - [address] [place] [address] Phone: [phone] Fax: [phone]     Social Determinants of Health ([place]) Interventions    Readmission Risk Interventions Readmission Risk Prevention Plan 02/15/2020  Transportation Screening Complete  PCP or Specialist Appt within 5-7 Days Not Complete  Not Complete comments pending disposition  Home Care Screening Complete  Medication Review (RN CM) Referral to Pharmacy  Some recent data might be hidden

## 2020-04-18 NOTE — Progress Notes (Signed)
PT Cancellation Note  Patient Details Name: Richard [name] MRN: [id number] DOB: 1956/05/18   Cancelled Treatment:    Reason Eval/Treat Not Completed: Patient at procedure or test/unavailable  Nursing working with patient trying to get his cortrak unclogged.    Arby Barrette, PT Pager [phone]  Rexanne Mano 04/18/2020, 10:51 AM

## 2020-04-18 NOTE — Progress Notes (Signed)
  Speech Language Pathology Treatment: Dysphagia  Patient Details Name: Richard [name] MRN: [id number] DOB: 12-16-1955 Today's Date: 04/18/2020 Time: 9794-8016 SLP Time Calculation (min) (ACUTE ONLY): 12 min  Assessment / Plan / Recommendation Clinical Impression  Pt was seen for dysphagia treatment. He was alert and cooperative. He commnicated verbally and appeared similar to the initial evaluation with this SLP. Brooke, RN indicated that the pt tolerated breakfast without difficulty and then refused lunch. No s/sx of aspiration were noted with any solids or liquids. Mastication time was [facility] and no significant oral residue was noted. It is recommended that the pt's diet be advanced to regular texture solids and thin liquids. SLP will follow to ensure safety.   HPI HPI: Pt is a 64 y.o. male, with medical history significant of diabetes, osteoarthritis, hypertension, CKD 5, neuropathies, previous CVA with residual right-sided weakness, migraine headaches , patient with extremely poor compliance, he has refused hemodialysis access in the past, who presented to ED via EMS status post seizures witnessed by roommate. In ED patient was postictal, he was loaded with Keppra. CT head negative. BSE 02/14/20: Pt had a little throat clearing initially while drinking thin liquids in a suboptimal position, but once repositioned more fully upright, no further s/s of aspiration were noted. 04/11/20 pt without s/s aspiration; placed on regular/thin. 7/22 change in status; MD paged. Made NPO. To undergo further testing. MRI 7/22: 2 mm acute or early subacute infarct within the left parietal lobe periatrial white matter.      SLP Plan  Continue with current plan of care       Recommendations  Diet recommendations: Regular;Thin liquid Liquids provided via: Cup;Straw Medication Administration: Whole meds with liquid Supervision: Patient able to self feed Compensations: Slow rate;Small sips/bites Postural  Changes and/or Swallow Maneuvers: Seated upright 90 degrees                Oral Care Recommendations: Oral care BID Follow up Recommendations:  (TBD) SLP Visit Diagnosis: Dysphagia, unspecified (R13.10) Plan: Continue with current plan of care       Richard [name] I. Hardin Negus, [place], [place] Office number [phone] Pager [place] 04/18/2020, 4:40 PM

## 2020-04-18 NOTE — Progress Notes (Signed)
[facility] for Infectious Disease  Date of Admission:  04/10/2020             ASSESSMENT:  Richard [name] is more awake and alert today with less encephalopathy. We discussed his newly diagnosed HIV/AIDS disease and the need for ART therapy with antibiotic for OI prophylaxis. Discussed resources available and need to establish care in the clinic.   PLAN:  1. Continue Tivicay and lamivudine 2. Bactrim for OI prophylaxis.  3. Transition pharmacy working on [employer]  for time of discharge.  Principal Problem:   Encephalopathy Active Problems:   Tobacco abuse   Protein-calorie malnutrition, mild (HCC)   Hyperlipidemia   Neuropathy in diabetes (HCC)   GERD (gastroesophageal reflux disease)   Diabetes mellitus, type II, insulin dependent (HCC)   Altered mental status   CKD (chronic kidney disease) stage 5, GFR less than 15 ml/min (HCC)   History of cerebrovascular accident (CVA) with residual deficit   Seizure (HCC)   AIDS (acquired immune deficiency syndrome) (HCC)   ESRD (end stage renal disease) (HCC)   Creatinine elevation   Goals of care, counseling/discussion   Hypertensive urgency   Meningoencephalitis   Palliative care by specialist    carvedilol  3.125 mg Oral BID WC   Chlorhexidine Gluconate Cloth  6 each Topical Q0600   [START ON 04/19/2020] clopidogrel  75 mg Oral Daily   darbepoetin (ARANESP) injection - DIALYSIS  40 mcg Intravenous Q Wed-HD   dolutegravir  50 mg Oral Daily   feeding supplement (NEPRO CARB STEADY)  1,000 mL Per Tube Q24H   feeding supplement (PROSource TF)  45 mL Per Tube Daily   heparin injection (subcutaneous)  5,000 Units Subcutaneous Q8H   insulin aspart  0-9 Units Subcutaneous Q4H   [START ON 04/19/2020] lamiVUDine  300 mg Oral Daily   levETIRAcetam  500 mg Oral Q12H   LORazepam  2 mg Intravenous Once   pantoprazole  40 mg Oral Daily   pravastatin  80 mg Oral QHS   sulfamethoxazole-trimethoprim  1 tablet Oral Once per  day on Mon Wed Fri   tamsulosin  0.4 mg Oral Daily    SUBJECTIVE:  Afebrile overnight with no acute events. Less encephalopathy today and awake and alert.   No Known Allergies   Review of Systems: Review of Systems  Constitutional: Negative for chills, fever and weight loss.  Respiratory: Negative for cough, shortness of breath and wheezing.   Cardiovascular: Negative for chest pain and leg swelling.  Gastrointestinal: Negative for abdominal pain, constipation, diarrhea, nausea and vomiting.  Skin: Negative for rash.      OBJECTIVE: Vitals:   04/18/20 0000 04/18/20 0400 04/18/20 0500 04/18/20 0836  BP: (!) 167/69 (!) 169/86 (!) 165/73 (!) 177/90  Pulse:  85  90  Resp:  17  18  Temp:  98.3 F (36.8 C)  98 F (36.7 C)  TempSrc:  Oral  Oral  SpO2:  100%  100%  Weight:       Body mass index is 20.61 kg/m.  Physical Exam Constitutional:      General: He is not in acute distress.    Appearance: He is well-developed.  HENT:     Mouth/Throat:     Comments: Poor dental hygiene  Cardiovascular:     Rate and Rhythm: Normal rate and regular rhythm.     Heart sounds: Normal heart sounds.  Pulmonary:     Effort: Pulmonary effort is normal.     Breath sounds: Normal  breath sounds.  Skin:    General: Skin is warm and dry.  Neurological:     Mental Status: He is alert and oriented to person, place, and time.     Lab Results Lab Results  Component Value Date   WBC 4.6 04/18/2020   HGB 8.9 (L) 04/18/2020   HCT 27.2 (L) 04/18/2020   MCV 90.4 04/18/2020   PLT 125 (L) 04/18/2020    Lab Results  Component Value Date   CREATININE 5.79 (H) 04/18/2020   BUN 55 (H) 04/18/2020   NA 137 04/18/2020   K 3.6 04/18/2020   CL 100 04/18/2020   CO2 26 04/18/2020    Lab Results  Component Value Date   ALT 8 04/14/2020   AST 19 04/14/2020   ALKPHOS 79 04/14/2020   BILITOT 0.4 04/14/2020     Microbiology: Recent Results (from the past 240 hour(s))  SARS Coronavirus 2  by RT PCR (hospital order, performed in [facility] hospital lab) Nasopharyngeal Nasopharyngeal Swab     Status: None   Collection Time: 04/11/20  3:58 AM   Specimen: Nasopharyngeal Swab  Result Value Ref Range Status   SARS Coronavirus 2 NEGATIVE NEGATIVE Final    Comment: (NOTE) SARS-CoV-2 target nucleic acids are NOT DETECTED.  The SARS-CoV-2 RNA is generally detectable in upper and lower respiratory specimens during the acute phase of infection. The lowest concentration of SARS-CoV-2 viral copies this assay can detect is 250 copies / mL. A negative result does not preclude SARS-CoV-2 infection and should not be used as the sole basis for treatment or other patient management decisions.  A negative result may occur with improper specimen collection / handling, submission of specimen other than nasopharyngeal swab, presence of viral mutation(s) within the areas targeted by this assay, and inadequate number of viral copies (<250 copies / mL). A negative result must be combined with clinical observations, patient history, and epidemiological information.  Fact Sheet for Patients:   StrictlyIdeas.no  Fact Sheet for Healthcare Providers: BankingDealers.co.za  This test is not yet approved or  cleared by the [place] FDA and has been authorized for detection and/or diagnosis of SARS-CoV-2 by FDA under an Emergency Use Authorization (EUA).  This EUA will remain in effect (meaning this test can be used) for the duration of the COVID-19 declaration under Section 564(b)(1) of the Act, 21 U.S.C. section 360bbb-3(b)(1), unless the authorization is terminated or revoked sooner.  Performed at [facility], [place] [address] [place], [place] [address]   Culture, blood (routine x 2)     Status: None (Preliminary result)   Collection Time: 04/14/20 11:31 AM   Specimen: BLOOD RIGHT HAND  Result Value Ref Range Status   Specimen  Description BLOOD RIGHT HAND  Final   Special Requests   Final    BOTTLES DRAWN AEROBIC ONLY Blood Culture results may not be optimal due to an inadequate volume of blood received in culture bottles   Culture   Final    NO GROWTH 3 DAYS Performed at [facility], [place] [address], [place], [place] [address]    Report Status PENDING  Incomplete  Culture, blood (routine x 2)     Status: None (Preliminary result)   Collection Time: 04/14/20 11:36 AM   Specimen: BLOOD RIGHT HAND  Result Value Ref Range Status   Specimen Description BLOOD RIGHT HAND  Final   Special Requests   Final    BOTTLES DRAWN AEROBIC ONLY Blood Culture results may  not be optimal due to an inadequate volume of blood received in culture bottles   Culture   Final    NO GROWTH 3 DAYS Performed at [facility], [place] [address] [place], [place] [address]    Report Status PENDING  Incomplete  CSF culture     Status: None   Collection Time: 04/14/20  6:27 PM   Specimen: PATH Cytology CSF; Cerebrospinal Fluid  Result Value Ref Range Status   Specimen Description CSF  Final   Special Requests NONE  Final   Gram Stain NO WBC SEEN NO ORGANISMS SEEN CYTOSPIN SMEAR   Final   Culture   Final    NO GROWTH 3 DAYS Performed at [facility], [address] [place], [place] [address]    Report Status 04/18/2020 FINAL  Final  Fungus Stain     Status: None   Collection Time: 04/14/20  6:27 PM   Specimen: PATH Cytology CSF; Cerebrospinal Fluid  Result Value Ref Range Status   FUNGUS STAIN Final report  Final    Comment: (NOTE) Performed At: [facility] [address] [place], [place] [id number] Rush Farmer MD HH:[id number]    Fungal Source CSF  Final    Comment: Performed at [facility], [place] [address] [place], [place] [address]  Fungal Stain reflex     Status: None   Collection Time: 04/14/20  6:27 PM  Result Value Ref Range Status   Fungal stain result 1 Comment  Final    Comment:  (NOTE) KOH/Calcofluor preparation:  no fungus observed. Performed At: [facility] [address] [place], [place] [id number] Rush Farmer MD GI:[id number]      Terri Piedra, [place] for Infectious Disease [facility]  04/18/2020  11:57 AM

## 2020-04-19 DIAGNOSIS — R7989 Other specified abnormal findings of blood chemistry: Secondary | ICD-10-CM | POA: Diagnosis not present

## 2020-04-19 DIAGNOSIS — Z992 Dependence on renal dialysis: Secondary | ICD-10-CM

## 2020-04-19 DIAGNOSIS — N186 End stage renal disease: Secondary | ICD-10-CM | POA: Diagnosis not present

## 2020-04-19 DIAGNOSIS — E119 Type 2 diabetes mellitus without complications: Secondary | ICD-10-CM | POA: Diagnosis not present

## 2020-04-19 DIAGNOSIS — N185 Chronic kidney disease, stage 5: Secondary | ICD-10-CM | POA: Diagnosis not present

## 2020-04-19 LAB — GLUCOSE, CAPILLARY
Glucose-Capillary: 89 mg/dL (ref 70–99)
Glucose-Capillary: 75 mg/dL (ref 70–99)
Glucose-Capillary: 72 mg/dL (ref 70–99)
Glucose-Capillary: 125 mg/dL — ABNORMAL HIGH (ref 70–99)
Glucose-Capillary: 65 mg/dL — ABNORMAL LOW (ref 70–99)
Glucose-Capillary: 284 mg/dL — ABNORMAL HIGH (ref 70–99)
Glucose-Capillary: 92 mg/dL (ref 70–99)

## 2020-04-19 LAB — CULTURE, BLOOD (ROUTINE X 2)
Culture: NO GROWTH
Culture: NO GROWTH

## 2020-04-19 MED ORDER — LIDOCAINE-EPINEPHRINE 1 %-1:100000 IJ SOLN
20.0000 mL | Freq: Once | INTRAMUSCULAR | Status: DC
  Filled 2020-04-19 (×3): qty 20

## 2020-04-19 NOTE — Progress Notes (Signed)
Physical Therapy Treatment Patient Details Name: Richard [name] MRN: [id number] DOB: March 18, 1956 Today's Date: 04/19/2020    History of Present Illness 64 year old male with medical history of Arthritis, Diabetes mellitus, type II, insulin dependent, GERD, HTN, Immune deficiency disorder, migraines, neuropathy in diabetes, ruptured lumbar disc, ESRD stage V , GFR < 15 ml/min, declined  Dialysis in the past, CVA with residual right sided weakness, seizures admitted 7/20 through ED with seizure activity lasting 4 minutes per roommate, and encephalopathy. MRI of brain showed 2 mm infarct in left parietal lobe. A tunneled dialysis catheter was placed and  Dialysis was started on 7/23; New diagnosis this admission of HIV/AIDS     PT Comments    Pt more alert this session, able to state his name and responding to questions with short, one word answers. Pt bed placed in chair position for session and pt assisted in lifting back off bed, with mod/max A from therapist. VC for trunk control and reaching activities to encourage shift back to midline. Pt performed ADLs while attempting to sit up. Pt able to perform some active ROM on LLE, however RLE required PROM secondary to weakness.Patient would benefit from continued skilled PT to maximize functional independence and safety with mobility. Will continue to follow acutely.     Follow Up Recommendations  SNF;Supervision/Assistance - 24 hour     Equipment Recommendations  None recommended by PT    Recommendations for Other Services       Precautions / Restrictions Precautions Precautions: Fall Restrictions Weight Bearing Restrictions: No    Mobility  Bed Mobility Overal bed mobility: Needs Assistance Bed Mobility: Supine to Sit     Supine to sit: Total assist     General bed mobility comments: Bed placed in chair position for therapy session.  Transfers                    Ambulation/Gait                  Stairs             Wheelchair Mobility    Modified Rankin (Stroke Patients Only)       Balance Overall balance assessment: Needs assistance Sitting-balance support: No upper extremity supported;Feet supported;Feet unsupported Sitting balance-Leahy Scale: Zero Sitting balance - Comments: Pt bed placed in chair position and physical assist given for pt to lift back off HOB. Mod to Max A required to maintain trunk control w/o support of bed.  Postural control: Left lateral lean;Posterior lean   Standing balance-Leahy Scale: Zero Standing balance comment: unable                            Cognition Arousal/Alertness: Lethargic Behavior During Therapy: Flat affect Overall Cognitive Status: Difficult to assess                                 General Comments: Pt very lethargic with slow processing for answering questions and following commands. Able to state name and answers questions with single words.      Exercises General Exercises - Lower Extremity Short Arc Quad: PROM;AAROM;Both;5 reps;Seated (AAROM on L, PROM on R) Other Exercises Other Exercises: Pt bed placed in chair position and assist from therapist to bring trunk off bed. Able to maintain position for ~5 min at a time before fatiguing. Repeated several times during  session while performing ADL. Reaching activities to the right performed to shift pt back to midline as he has a L lateral lean in bed.    General Comments General comments (skin integrity, edema, etc.): Pt feet tender and toenails blackened.       Pertinent Vitals/Pain Pain Assessment: No/denies pain Faces Pain Scale: Hurts little more Pain Location: BIL Feet when touched Pain Descriptors / Indicators: Discomfort;Grimacing Pain Intervention(s): Monitored during session;Limited activity within patient's tolerance    Home Living                      Prior Function            PT Goals (current goals can  now be found in the care plan section) Acute Rehab PT Goals Patient Stated Goal: none stated PT Goal Formulation: Patient unable to participate in goal setting Time For Goal Achievement: 04/30/20 Potential to Achieve Goals: Fair Progress towards PT goals: Progressing toward goals    Frequency    Min 3X/week      PT Plan Current plan remains appropriate    Co-evaluation PT/OT/SLP Co-Evaluation/Treatment: Yes Reason for Co-Treatment: Complexity of the patient's impairments (multi-system involvement);Necessary to address cognition/behavior during functional activity;For patient/therapist safety PT goals addressed during session: Mobility/safety with mobility;Strengthening/ROM OT goals addressed during session: ADL's and self-care      AM-PAC PT "6 Clicks" Mobility   Outcome Measure  Help needed turning from your back to your side while in a flat bed without using bedrails?: Total Help needed moving from lying on your back to sitting on the side of a flat bed without using bedrails?: Total Help needed moving to and from a bed to a chair (including a wheelchair)?: Total Help needed standing up from a chair using your arms (e.g., wheelchair or bedside chair)?: Total Help needed to walk in hospital room?: Total Help needed climbing 3-5 steps with a railing? : Total 6 Click Score: 6    End of Session   Activity Tolerance: Patient limited by lethargy Patient left: in bed;with call bell/phone within reach;with bed alarm set Nurse Communication: Mobility status PT Visit Diagnosis: Muscle weakness (generalized) (M62.81);Difficulty in walking, not elsewhere classified (R26.2)     Time: 1003-1030 PT Time Calculation (min) (ACUTE ONLY): 27 min  Charges:  $Neuromuscular Re-education: 8-22 mins                     Benjiman Core, [place] Pager [phone] Acute Rehab  Allena Katz 04/19/2020, 10:52 AM

## 2020-04-19 NOTE — Progress Notes (Signed)
Subjective:  obtunded  Antibiotics:  Anti-infectives (From admission, onward)   Start     Dose/Rate Route Frequency Ordered Stop   04/19/20 1000  lamiVUDine (EPIVIR) tablet 300 mg     Discontinue    Note to Pharmacy: I DO NOT renally dose lamivudine and I have never had problems NOT adjusting it. I want pt to be on Dovato but still not on formulary here (unfortunately)   300 mg Oral Daily 04/18/20 1043     04/17/20 0000  Dolutegravir-lamiVUDine (DOVATO) 50-300 MG TABS     Discontinue     1 tablet Oral Daily 04/17/20 1546     04/16/20 1000  lamiVUDine (EPIVIR) tablet 300 mg  Status:  Discontinued       Note to Pharmacy: I DO NOT renally dose lamivudine and I have never had problems NOT adjusting it. I want pt to be on Dovato but still not on formulary here (unfortunately)   300 mg Per Tube Daily 04/15/20 1307 04/18/20 1043   04/16/20 0900  sulfamethoxazole-trimethoprim (BACTRIM DS) 800-160 MG per tablet 1 tablet     Discontinue     1 tablet Oral Once per day on Mon Wed Fri 04/15/20 1350     04/15/20 1245  dolutegravir (TIVICAY) tablet 50 mg     Discontinue     50 mg Oral Daily 04/15/20 1202     04/15/20 1245  lamiVUDine (EPIVIR) tablet 300 mg  Status:  Discontinued       Note to Pharmacy: I DO NOT renally dose lamivudine and I have never had problems NOT adjusting it. I want pt to be on Dovato but still not on formulary here (unfortunately)   300 mg Oral Daily 04/15/20 1202 04/15/20 1307   04/13/20 1737  ceFAZolin (ANCEF) 2-4 GM/100ML-% IVPB       Note to Pharmacy: Desiree Hane   : cabinet override      04/13/20 1737 04/14/20 0544   04/13/20 0000  ceFAZolin (ANCEF) IVPB 2g/100 mL premix        2 g 200 mL/hr over 30 Minutes Intravenous To Radiology 04/12/20 1454 04/13/20 0834      Medications: Scheduled Meds: . amLODipine  5 mg Oral Daily  . carvedilol  3.125 mg Oral BID WC  . Chlorhexidine Gluconate Cloth  6 each Topical Q0600  . clopidogrel  75 mg Oral Daily  .  darbepoetin (ARANESP) injection - DIALYSIS  40 mcg Intravenous Q Wed-HD  . dolutegravir  50 mg Oral Daily  . feeding supplement (NEPRO CARB STEADY)  237 mL Oral TID BM  . heparin injection (subcutaneous)  5,000 Units Subcutaneous Q8H  . insulin aspart  0-9 Units Subcutaneous Q4H  . lamiVUDine  300 mg Oral Daily  . levETIRAcetam  500 mg Oral Q12H  . lidocaine-EPINEPHrine  20 mL Intradermal Once  . LORazepam  2 mg Intravenous Once  . pantoprazole  40 mg Oral Daily  . pravastatin  80 mg Oral QHS  . sulfamethoxazole-trimethoprim  1 tablet Oral Once per day on Mon Wed Fri  . tamsulosin  0.4 mg Oral Daily   Continuous Infusions: . ferric gluconate (FERRLECIT/NULECIT) IV 125 mg (04/18/20 2323)  . levETIRAcetam 500 mg (04/19/20 1059)   PRN Meds:.hydrALAZINE, labetalol    Objective: Weight change:   Intake/Output Summary (Last 24 hours) at 04/19/2020 1349 Last data filed at 04/19/2020 0925 Gross per 24 hour  Intake 120 ml  Output 2400 ml  Net -2280 ml  Blood pressure 123/70, pulse 85, temperature (!) 96.5 F (35.8 C), temperature source Axillary, resp. rate 20, weight 67.4 kg, SpO2 100 %. Temp:  [96.5 F (35.8 C)-98.3 F (36.8 C)] 96.5 F (35.8 C) (07/29 1108) Pulse Rate:  [75-92] 85 (07/29 1108) Resp:  [15-20] 20 (07/29 1108) BP: (99-185)/(52-105) 123/70 (07/29 1108) SpO2:  [96 %-100 %] 100 % (07/29 1108) Weight:  [67.4 kg-68.4 kg] 67.4 kg (07/29 0026)  Physical Exam: General: Patient somnolent requires sternal rub to awaken HEENT: anicteric sclera, EOMI CVS tachycardic no murmurs gallops rubs l  Chest: , no wheezing, no respiratory distress lungs clear Abdomen: soft non-distended,  Extremities: Right upper extremity with contracture Skin: Diffuse rash that is concerning for possible disseminated cryptococcal infection Neuro: Right upper extremity with contracture but able to move his hands also wiggle toes  CBC:    BMET Recent Labs    04/18/20 0343  NA 137  K  3.6  CL 100  CO2 26  GLUCOSE 132*  BUN 55*  CREATININE 5.79*  CALCIUM 7.9*     Liver Panel  Recent Labs    04/18/20 0343  ALBUMIN 2.2*       Sedimentation Rate No results for input(s): ESRSEDRATE in the last 72 hours. C-Reactive Protein No results for input(s): CRP in the last 72 hours.  Micro Results: Recent Results (from the past 720 hour(s))  SARS Coronavirus 2 by RT PCR (hospital order, performed in [facility] hospital lab) Nasopharyngeal Nasopharyngeal Swab     Status: None   Collection Time: 04/11/20  3:58 AM   Specimen: Nasopharyngeal Swab  Result Value Ref Range Status   SARS Coronavirus 2 NEGATIVE NEGATIVE Final    Comment: (NOTE) SARS-CoV-2 target nucleic acids are NOT DETECTED.  The SARS-CoV-2 RNA is generally detectable in upper and lower respiratory specimens during the acute phase of infection. The lowest concentration of SARS-CoV-2 viral copies this assay can detect is 250 copies / mL. A negative result does not preclude SARS-CoV-2 infection and should not be used as the sole basis for treatment or other patient management decisions.  A negative result may occur with improper specimen collection / handling, submission of specimen other than nasopharyngeal swab, presence of viral mutation(s) within the areas targeted by this assay, and inadequate number of viral copies (<250 copies / mL). A negative result must be combined with clinical observations, patient history, and epidemiological information.  Fact Sheet for Patients:   StrictlyIdeas.no  Fact Sheet for Healthcare Providers: BankingDealers.co.za  This test is not yet approved or  cleared by the [place] FDA and has been authorized for detection and/or diagnosis of SARS-CoV-2 by FDA under an Emergency Use Authorization (EUA).  This EUA will remain in effect (meaning this test can be used) for the duration of the COVID-19 declaration under  Section 564(b)(1) of the Act, 21 U.S.C. section 360bbb-3(b)(1), unless the authorization is terminated or revoked sooner.  Performed at [facility], [place] [address] [place], [place] [address]   Culture, blood (routine x 2)     Status: None   Collection Time: 04/14/20 11:31 AM   Specimen: BLOOD RIGHT HAND  Result Value Ref Range Status   Specimen Description BLOOD RIGHT HAND  Final   Special Requests   Final    BOTTLES DRAWN AEROBIC ONLY Blood Culture results may not be optimal due to an inadequate volume of blood received in culture bottles   Culture   Final    NO GROWTH 5 DAYS  Performed at [facility], [place] [address] [place], [place] [address]    Report Status 04/19/2020 FINAL  Final  Culture, blood (routine x 2)     Status: None   Collection Time: 04/14/20 11:36 AM   Specimen: BLOOD RIGHT HAND  Result Value Ref Range Status   Specimen Description BLOOD RIGHT HAND  Final   Special Requests   Final    BOTTLES DRAWN AEROBIC ONLY Blood Culture results may not be optimal due to an inadequate volume of blood received in culture bottles   Culture   Final    NO GROWTH 5 DAYS Performed at [facility], [place] [address], [place], [place] [address]    Report Status 04/19/2020 FINAL  Final  CSF culture     Status: None   Collection Time: 04/14/20  6:27 PM   Specimen: PATH Cytology CSF; Cerebrospinal Fluid  Result Value Ref Range Status   Specimen Description CSF  Final   Special Requests NONE  Final   Gram Stain NO WBC SEEN NO ORGANISMS SEEN CYTOSPIN SMEAR   Final   Culture   Final    NO GROWTH 3 DAYS Performed at [facility], [address], [place], [place] [address]    Report Status 04/18/2020 FINAL  Final  Fungus Stain     Status: None   Collection Time: 04/14/20  6:27 PM   Specimen: PATH Cytology CSF; Cerebrospinal Fluid  Result Value Ref Range Status   FUNGUS STAIN Final report  Final    Comment: (NOTE) Performed At: [facility] [address] [place], [place] [id number] Rush Farmer MD [id number]    Fungal Source CSF  Final    Comment: Performed at [facility], [place] [address], [place], [place] [address]  Fungal Stain reflex     Status: None   Collection Time: 04/14/20  6:27 PM  Result Value Ref Range Status   Fungal stain result 1 Comment  Final    Comment: (NOTE) KOH/Calcofluor preparation:  no fungus observed. Performed At: [facility] [place], [place] [id number] Rush Farmer MD YJ:[id number]     Studies/Results: No results found.    Assessment/Plan:  INTERVAL HISTORY:  His mental status is deteriorated in the last 24 hours.  Principal Problem:   Encephalopathy Active Problems:   Tobacco abuse   Malnutrition of moderate degree (HCC)   Hyperlipidemia   Neuropathy in diabetes (HCC)   GERD (gastroesophageal reflux disease)   Diabetes mellitus, type II, insulin dependent (HCC)   Altered mental status   CKD (chronic kidney disease) stage 5, GFR less than 15 ml/min (HCC)   History of cerebrovascular accident (CVA) with residual deficit   Seizure ([place])   AIDS (acquired immune deficiency syndrome) ([place])   ESRD (end stage renal disease) ([place])   Creatinine elevation   Goals of care, counseling/discussion   Hypertensive urgency   Meningoencephalitis   Palliative care by specialist    Richard [name] is a 64 y.o. male with past medical history significant for poorly controlled diabetes and hypertension with progression of his chronic kidney disease, prior stroke who was admitted with seizures and encephalopathy and found to have a parietal infarct.  He also has been found to have HIV and AIDS with a CD4 count of 39.  He has a diffuse rash that is concerning for possible disseminated cryptococcal infection versus histoplasma Blastomyces  #1  Central nervous system infarct with encephalopathy: I was concerned  that he could have disseminated  cryptococcal infection cryptococcal meningitis.    Fortunately had a normal opening pressure and a negative cryptococcal antigen in CSF.  His CSF profile is also normal although it can be normal in people with HIV AIDS and cryptococcal meningitis.  He does not appear to have any evidence of CNS infection which is a relief  I do wonder if potentially uremia is contributing and potentially was better yesterday because had received dialysis.?   2.  HIV and AIDS: Now that we know he does not have a CNS opportunistic infection I will initiate antiretroviral therapy  I had wanted to use Dovato as a single tablet regimen for him (note I do not renally adjust lamivudine and I found that there are no no problems in terms of tolerability or toxicity in dialysis patients to receive full dose lamivudine.  )  We do not have Dovato on formulary yet so will be given in its component parts.  Note his tube feeds will need to be held before and after getting his antivirals as the calcium and magnesium can chelate the TIVICAY   Bactrim 3 times weekly  3.  Chronic kidney disease that is progressed to end-stage renal disease.  He is on HD    4 skin rash: It has appearance consistent with that of a dimorphic fungus I think it clearly needs a biopsy with 2 specimens 1 sent to pathology another one sent in a sterile container for AFB and fungal cultures.  Understanding is that his biopsies are happening today.       LOS: 9 days   Alcide Evener 04/19/2020, 1:49 PM

## 2020-04-19 NOTE — Progress Notes (Signed)
PROGRESS NOTE    Richard [name]  TIW:[id number] DOB: 07-20-1956 DOA: 04/10/2020 PCP: [name], MD   Brief Narrative:  HPI On 04/10/2020 by Dr. Phillips Climes Linken Mcglothen  is a 64 y.o. male, with medical history significant ofdiabetes, osteoarthritis, hypertension,CKD 5,neuropathies, previous CVA with residual right-sided weakness, migraine headaches , patient with extremely poor compliance, he has refused hemodialysis access in the past, presents to ED via EMS status post seizures, he is known history of seizures, but has not been noncompliant with his seizure medications, seizures was witnessed by his roommate, seizures lasted for 4 minutes, she will patient's extremely encephalopathic, unable to provide any history, but upon my evaluation is more appropriate, patient report actually he never got his seizure medications, was not taking it, patient denies any fever, chest pain, shortness of breath, no dysuria or polyuria, reports usually ambulates with a walker, he has right-sided weakness at baseline.  Interim history: Admitted for AMS, found to have CVA and seizure. Neuro was consulted. Currently on AED. Vascular consulted for access placement as patient recently started HD for ESRD. Nephro continues to follow for dialysis.  NG tube removed on the 28th, tolerating diet continue to advance. Currently awaiting placement.   Assessment & Plan  Principal Problem:   Encephalopathy Active Problems:   Tobacco abuse   Malnutrition of moderate degree (HCC)   Hyperlipidemia   Neuropathy in diabetes (HCC)   GERD (gastroesophageal reflux disease)   Diabetes mellitus, type II, insulin dependent (HCC)   Altered mental status   CKD (chronic kidney disease) stage 5, GFR less than 15 ml/min (HCC)   History of cerebrovascular accident (CVA) with residual deficit   Seizure ([place])   AIDS (acquired immune deficiency syndrome) (HCC)   ESRD (end stage renal disease) ([place])    Creatinine elevation   Goals of care, counseling/discussion   Hypertensive urgency   Meningoencephalitis   Palliative care by specialist   Acute metabolic encephalopathy, multifactorial, POA, improving -likely multifactorial including seizure, hypertension, acute stroke, uremia -Patient continues to be somewhat somnolent but markedly improving from previous documentation  Acute CVA, POA, resolving -Also with history of CVA with residual right-sided weakness and chronic right upper extremity contracture -Initial CT scan on admission no acute findings -Due to acute mental status change on the morning of July 22, code stroke initiated, stat CT scan no acute findings, stat MRI showed "2 mm acute or early subacute infarct within the left parietal lobe periatrial white matter" -HIV screening positive -s/p Lumbar puncture on 7/24, CSF Gram stain negative. Cryptococcus PCR negative. VDRL CSF is pending. -PT, OT recommending SNF -Core track removed, now tolerating p.o. per speech continue to advance diet -Neurology consulted and appreciate insight and recommendations -Continue Plavix, statin  Recurrent seizures in the setting of noncomlpiance, POA - He is known to be noncompliant with his seizure medication - EEG July 24 "This study is suggestive of moderate diffuse encephalopathy, nonspecific etiology.No seizures or epileptiform discharges were seen throughout the recording" -s/p  Lumbar puncture on 7/24, CSF Gram stain negative. Cryptococcus PCR negative. VDRL CSF is pending. -Previously on IV Keppra and IV Vimpat , now on iv keppra only per neurology recommendation -Appreciate neurology recommendation  Hypertensive urgency, present on admission -Resolved -Currently well controlled on Coreg  Chronic kidney disease, stage V now progressing to ESRD  -Creatinine in 10-11 range, creatinine appears to be at baseline. -Patient has refused dialysis in the past, agreeable to dialysis on  presentation -Nephrology consulted and appreciated -IR  placed dialysis catheter, tolerating well -Currently tolerating hemodialysis well - started on 04/13/2020 -Vascular surgery consulted and appreciated for placement of access  Anemia of chronic disease -hemoglobin stable, continue to monitor CBC  HIV/AIDS, newly diagnosed  -CD4 count 39, viral load 624,000 -New diagnosis -Infectious disease consulted and appreciated-continue tivicay, epivir -Currently on bactrim  Dark skin lesions -Nontender, does not itch -Molluscum contagiosum ? Kaposi sarcoma? -Hold off on surgical biopsy per general surgery given limited value/unlikely to change patient's current trajectory.  Diabetes mellitus, type II with episode of hypoglycemia -Trolled, hemoglobin A1c 5.2 (02/14/2020) -On Novolin 70/30 at home -Had hypoglycemia requiring D5 infusion briefly on 7/23,  -Continue insulin sliding scale with CBG monitoring  Hyperlipidemia -Total cholesterol 147, HDL 31, LDL 91, triglycerides 125 -Continue statin  Tobacco use -Cessation discussed by previous physician   History of severe noncompliance -Per previous documentation, patient counseled  Goals of care discussion -Palliative care consulted and appreciated  DVT Prophylaxis  heaprin  Code Status: Full  Family Communication: None at bedside  Disposition Plan:  Status is: Inpatient  Remains inpatient appropriate because:Altered mental status, IV treatments appropriate due to intensity of illness or inability to take PO and Inpatient level of care appropriate due to severity of illness   Dispo: The patient is from: Home              Anticipated d/c is to: SNF              Anticipated d/c date is: > 3 days              Patient currently is not medically stable to d/c.   Consultants Nephrology Neurology Advanced radiology Infectious disease Palliative care General surgery  Procedures  Bedside lumbar puncture attempted July  24 by neurology Lumbar puncture by IR on July 24 Dialysis catheter placement by interventional radiology   Antibiotics   Anti-infectives (From admission, onward)   Start     Dose/Rate Route Frequency Ordered Stop   04/19/20 1000  lamiVUDine (EPIVIR) tablet 300 mg     Discontinue    Note to Pharmacy: I DO NOT renally dose lamivudine and I have never had problems NOT adjusting it. I want pt to be on Dovato but still not on formulary here (unfortunately)   300 mg Oral Daily 04/18/20 1043     04/17/20 0000  Dolutegravir-lamiVUDine (DOVATO) 50-300 MG TABS     Discontinue     1 tablet Oral Daily 04/17/20 1546     04/16/20 1000  lamiVUDine (EPIVIR) tablet 300 mg  Status:  Discontinued       Note to Pharmacy: I DO NOT renally dose lamivudine and I have never had problems NOT adjusting it. I want pt to be on Dovato but still not on formulary here (unfortunately)   300 mg Per Tube Daily 04/15/20 1307 04/18/20 1043   04/16/20 0900  sulfamethoxazole-trimethoprim (BACTRIM DS) 800-160 MG per tablet 1 tablet     Discontinue     1 tablet Oral Once per day on Mon Wed Fri 04/15/20 1350     04/15/20 1245  dolutegravir (TIVICAY) tablet 50 mg     Discontinue     50 mg Oral Daily 04/15/20 1202     04/15/20 1245  lamiVUDine (EPIVIR) tablet 300 mg  Status:  Discontinued       Note to Pharmacy: I DO NOT renally dose lamivudine and I have never had problems NOT adjusting it. I want pt to be on Dovato  but still not on formulary here (unfortunately)   300 mg Oral Daily 04/15/20 1202 04/15/20 1307   04/13/20 1737  ceFAZolin (ANCEF) 2-4 GM/100ML-% IVPB       Note to Pharmacy: Desiree Hane   : cabinet override      04/13/20 1737 04/14/20 0544   04/13/20 0000  ceFAZolin (ANCEF) IVPB 2g/100 mL premix        2 g 200 mL/hr over 30 Minutes Intravenous To Radiology 04/12/20 1454 04/13/20 0834      Subjective:   No acute events or issues overnight, somnolent, review of systems somewhat limited but denies chest pain  shortness of breath nausea vomiting or headache.  Objective:   Vitals:   04/18/20 2345 04/19/20 0015 04/19/20 0026 04/19/20 0328  BP: (!) 111/57 (!) 106/63 (!) 104/59 (!) 170/85  Pulse: 80 85 77 92  Resp: 18 18 18 15   Temp:   98 F (36.7 C) 98.3 F (36.8 C)  TempSrc:   Oral Oral  SpO2:   97% 96%  Weight:   67.4 kg     Intake/Output Summary (Last 24 hours) at 04/19/2020 0730 Last data filed at 04/19/2020 0026 Gross per 24 hour  Intake --  Output 2400 ml  Net -2400 ml   Filed Weights   04/17/20 0500 04/18/20 2043 04/19/20 0026  Weight: 68.9 kg 68.4 kg 67.4 kg   Exam  General: Well developed, chronically ill appearing, NAD  HEENT: NCAT,  mucous membranes moist. Cortrak   Cardiovascular: S1 S2 auscultated, RRR.  Respiratory: Clear to auscultation bilaterally   Abdomen: Soft, nontender, nondistended, + bowel sounds  Extremities: warm dry without cyanosis clubbing or edema, RUE contracture  Neuro: Sleepy, can answers some questions.   Skin: Diffuse rash  Data Reviewed: I have personally reviewed following labs and imaging studies  CBC: Recent Labs  Lab 04/14/20 1137 04/16/20 0740 04/18/20 0343  WBC 4.9 4.4 4.6  NEUTROABS 3.6  --   --   HGB 9.2* 8.4* 8.9*  HCT 27.5* 26.4* 27.2*  MCV 87.0 91.0 90.4  PLT 180 155 656*   Basic Metabolic Panel: Recent Labs  Lab 04/13/20 1113 04/13/20 1113 04/13/20 1829 04/13/20 1829 04/14/20 0511 04/14/20 1137 04/14/20 1944 04/15/20 0452 04/16/20 0740 04/18/20 0343  NA 148*  --   --   --   --  143  --   --  139 137  K 4.5  --   --   --   --  3.8  --   --  3.5 3.6  CL 119*  --   --   --   --  110  --   --  103 100  CO2 12*  --   --   --   --  21*  --   --  25 26  GLUCOSE 68*  --   --   --   --  107*  --   --  106* 132*  BUN 94*  --   --   --   --  55*  --   --  48* 55*  CREATININE 11.51*  --   --   --   --  7.74*  --   --  6.56* 5.79*  CALCIUM 8.1*  --   --   --  7.7* 8.0*  --   --  7.7* 7.9*  MG  --   --  1.6*  --   1.5*  --  1.6* 2.2  --   --  PHOS 7.6*   < > 8.3*   < > 4.4  --  2.5 3.4 3.5 2.8   < > = values in this interval not displayed.   GFR: CrCl cannot be calculated (Unknown ideal weight.). Liver Function Tests: Recent Labs  Lab 04/13/20 1113 04/14/20 1137 04/16/20 0740 04/18/20 0343  AST  --  19  --   --   ALT  --  8  --   --   ALKPHOS  --  79  --   --   BILITOT  --  0.4  --   --   PROT  --  6.1*  --   --   ALBUMIN 2.3* 2.3* 2.1* 2.2*   No results for input(s): LIPASE, AMYLASE in the last 168 hours. No results for input(s): AMMONIA in the last 168 hours. Coagulation Profile: Recent Labs  Lab 04/13/20 1113  INR 1.1   Cardiac Enzymes: No results for input(s): CKTOTAL, CKMB, CKMBINDEX, TROPONINI in the last 168 hours. BNP (last 3 results) Recent Labs    06/22/19 1505  PROBNP 170.0*   HbA1C: No results for input(s): HGBA1C in the last 72 hours. CBG: Recent Labs  Lab 04/18/20 1215 04/18/20 1600 04/18/20 1928 04/19/20 0119 04/19/20 0326  GLUCAP 140* 77 155* 89 75   Lipid Profile: No results for input(s): CHOL, HDL, LDLCALC, TRIG, CHOLHDL, LDLDIRECT in the last 72 hours. Thyroid Function Tests: No results for input(s): TSH, T4TOTAL, FREET4, T3FREE, THYROIDAB in the last 72 hours. Anemia Panel: No results for input(s): VITAMINB12, FOLATE, FERRITIN, TIBC, IRON, RETICCTPCT in the last 72 hours. Urine analysis:    Component Value Date/Time   COLORURINE STRAW (A) 04/11/2020 0223   APPEARANCEUR CLEAR 04/11/2020 0223   LABSPEC 1.011 04/11/2020 0223   PHURINE 5.0 04/11/2020 0223   GLUCOSEU 50 (A) 04/11/2020 0223   HGBUR SMALL (A) 04/11/2020 0223   BILIRUBINUR NEGATIVE 04/11/2020 0223   KETONESUR NEGATIVE 04/11/2020 0223   PROTEINUR >=300 (A) 04/11/2020 0223   UROBILINOGEN 0.2 11/29/2014 2003   NITRITE NEGATIVE 04/11/2020 0223   LEUKOCYTESUR NEGATIVE 04/11/2020 0223    Recent Results (from the past 240 hour(s))  SARS Coronavirus 2 by RT PCR (hospital order,  performed in [facility] hospital lab) Nasopharyngeal Nasopharyngeal Swab     Status: None   Collection Time: 04/11/20  3:58 AM   Specimen: Nasopharyngeal Swab  Result Value Ref Range Status   SARS Coronavirus 2 NEGATIVE NEGATIVE Final    Comment: (NOTE) SARS-CoV-2 target nucleic acids are NOT DETECTED.  The SARS-CoV-2 RNA is generally detectable in upper and lower respiratory specimens during the acute phase of infection. The lowest concentration of SARS-CoV-2 viral copies this assay can detect is 250 copies / mL. A negative result does not preclude SARS-CoV-2 infection and should not be used as the sole basis for treatment or other patient management decisions.  A negative result may occur with improper specimen collection / handling, submission of specimen other than nasopharyngeal swab, presence of viral mutation(s) within the areas targeted by this assay, and inadequate number of viral copies (<250 copies / mL). A negative result must be combined with clinical observations, patient history, and epidemiological information.  Fact Sheet for Patients:   StrictlyIdeas.no  Fact Sheet for Healthcare Providers: BankingDealers.co.za  This test is not yet approved or  cleared by the [place] FDA and has been authorized for detection and/or diagnosis of SARS-CoV-2 by FDA under an Emergency Use Authorization (EUA).  This EUA will remain in effect (  meaning this test can be used) for the duration of the COVID-19 declaration under Section 564(b)(1) of the Act, 21 U.S.C. section 360bbb-3(b)(1), unless the authorization is terminated or revoked sooner.  Performed at [facility], [place] [address], [place], [place] [address]   Culture, blood (routine x 2)     Status: None (Preliminary result)   Collection Time: 04/14/20 11:31 AM   Specimen: BLOOD RIGHT HAND  Result Value Ref Range Status   Specimen Description BLOOD RIGHT HAND  Final    Special Requests   Final    BOTTLES DRAWN AEROBIC ONLY Blood Culture results may not be optimal due to an inadequate volume of blood received in culture bottles   Culture   Final    NO GROWTH 3 DAYS Performed at [name], [place] [address] [place], [place] [address]    Report Status PENDING  Incomplete  Culture, blood (routine x 2)     Status: None (Preliminary result)   Collection Time: 04/14/20 11:36 AM   Specimen: BLOOD RIGHT HAND  Result Value Ref Range Status   Specimen Description BLOOD RIGHT HAND  Final   Special Requests   Final    BOTTLES DRAWN AEROBIC ONLY Blood Culture results may not be optimal due to an inadequate volume of blood received in culture bottles   Culture   Final    NO GROWTH 3 DAYS Performed at [facility], [place] [address], [place], [place] [address]    Report Status PENDING  Incomplete  CSF culture     Status: None   Collection Time: 04/14/20  6:27 PM   Specimen: PATH Cytology CSF; Cerebrospinal Fluid  Result Value Ref Range Status   Specimen Description CSF  Final   Special Requests NONE  Final   Gram Stain NO WBC SEEN NO ORGANISMS SEEN CYTOSPIN SMEAR   Final   Culture   Final    NO GROWTH 3 DAYS Performed at [facility], [address] [place], [place] [address]    Report Status 04/18/2020 FINAL  Final  Fungus Stain     Status: None   Collection Time: 04/14/20  6:27 PM   Specimen: PATH Cytology CSF; Cerebrospinal Fluid  Result Value Ref Range Status   FUNGUS STAIN Final report  Final    Comment: (NOTE) Performed At: [facility] [address] [place], [place] [id number] Rush Farmer MD IO:[id number]    Fungal Source CSF  Final    Comment: Performed at [facility], [place] [address], [place], [place] [address]  Fungal Stain reflex     Status: None   Collection Time: 04/14/20  6:27 PM  Result Value Ref Range Status   Fungal stain result 1 Comment  Final    Comment: (NOTE) KOH/Calcofluor preparation:   no fungus observed. Performed At: [facility] [place], [place] [id number] Rush Farmer MD FY:[id number]       Radiology Studies: No results found.   Scheduled Meds:  amLODipine  5 mg Oral Daily   carvedilol  3.125 mg Oral BID WC   Chlorhexidine Gluconate Cloth  6 each Topical Q0600   clopidogrel  75 mg Oral Daily   darbepoetin (ARANESP) injection - DIALYSIS  40 mcg Intravenous Q Wed-HD   dolutegravir  50 mg Oral Daily   feeding supplement (NEPRO CARB STEADY)  237 mL Oral TID BM   heparin injection (subcutaneous)  5,000 Units Subcutaneous Q8H   insulin aspart  0-9 Units Subcutaneous Q4H   lamiVUDine  300 mg Oral Daily   levETIRAcetam  500 mg Oral Q12H   LORazepam  2 mg Intravenous Once   pantoprazole  40 mg Oral Daily   pravastatin  80 mg Oral QHS   sulfamethoxazole-trimethoprim  1 tablet Oral Once per day on Mon Wed Fri   tamsulosin  0.4 mg Oral Daily   Continuous Infusions:  ferric gluconate (FERRLECIT/NULECIT) IV 125 mg (04/18/20 2323)   levETIRAcetam 500 mg (04/17/20 0914)     LOS: 9 days   Time Spent in minutes   30 minutes  Little Ishikawa D.O. on 04/19/2020 at 7:30 AM  After 7pm go to [email or web address]

## 2020-04-19 NOTE — Progress Notes (Signed)
Occupational Therapy Treatment Patient Details Name: Richard [name] MRN: [id number] DOB: 10-May-1956 Today's Date: 04/19/2020    History of present illness 64 year old male with medical history of Arthritis, Diabetes mellitus, type II, insulin dependent, GERD, HTN, Immune deficiency disorder, migraines, neuropathy in diabetes, ruptured lumbar disc, ESRD stage V , GFR < 15 ml/min, declined  Dialysis in the past, CVA with residual right sided weakness, seizures admitted 7/20 through ED with seizure activity lasting 4 minutes per roommate, and encephalopathy. MRI of brain showed 2 mm infarct in left parietal lobe. A tunneled dialysis catheter was placed and  Dialysis was started on 7/23; New diagnosis this admission of HIV/AIDS    OT comments  Upon arrival pt slouched to the left in bed but awake and agreeable to skilled-OT session. Pt positioned in chair position and bed and brought to midline with Max A. Pt completed face washing and teeth brushing in chair position with max A due to generalized weakness and decreased functional use of BUEs. Pt able to don lotion to simulate LB bathing with mod-max A for leaning forward with multimodal cues for attention to task and sequencing. Pt more alert this session but still lethargic - able to respond to questions with 1 word and keeping eyes open most of the session. Pt left in chair position in bed with call bell and phone within reach. Believe dc recommendations remain appropriate. Will follow acutely.   Follow Up Recommendations  SNF;Supervision/Assistance - 24 hour    Equipment Recommendations  3 in 1 bedside commode       Precautions / Restrictions Precautions Precautions: Fall Restrictions Weight Bearing Restrictions: No       Mobility Bed Mobility Overal bed mobility: Needs Assistance Bed Mobility: Supine to Sit     Supine to sit: Total assist     General bed mobility comments: Bed placed in chair position for therapy session  with total A to bring trunk midline when leaning to the L upon arrival  Transfers                 General transfer comment: did not attempt due to pt safety    Balance Overall balance assessment: Needs assistance Sitting-balance support: No upper extremity supported;Feet supported;Feet unsupported Sitting balance-Leahy Scale: Zero Sitting balance - Comments: Pt bed placed in chair position and physical assist given for pt to lift back off HOB. Mod to Max A required to maintain trunk control w/o support of bed.  Postural control: Left lateral lean;Posterior lean   Standing balance-Leahy Scale: Zero Standing balance comment: unable                           ADL either performed or assessed with clinical judgement   ADL       Grooming: Wash/dry face;Oral care;Maximal assistance;Sitting;Cueing for sequencing Grooming Details (indicate cue type and reason): Pt needing Max A with arm to face for washing face and brushing teeth due to decreased strength     Lower Body Bathing: Maximal assistance;Sitting/lateral leans Lower Body Bathing Details (indicate cue type and reason): Max A for donning lotion in chair position in bed. Max A with mutimodal cues to initiate movement and bring trunk forward                       General ADL Comments: Pt with generalized weakness and decreased functional use of BUEs  Cognition Arousal/Alertness: Lethargic Behavior During Therapy: Flat affect Overall Cognitive Status: Impaired/Different from baseline Area of Impairment: Attention;Memory;Following commands;Safety/judgement;Awareness;Problem solving                   Current Attention Level: Focused Memory: Decreased recall of precautions;Decreased short-term memory Following Commands: Follows one step commands with increased time;Follows multi-step commands inconsistently Safety/Judgement: Decreased awareness of safety;Decreased awareness of  deficits Awareness: Intellectual Problem Solving: Slow processing;Decreased initiation;Difficulty sequencing;Requires verbal cues;Requires tactile cues General Comments: Pt more awake than initial eval. Pt still lethargic with needing increased time for one step commands and slow processing. Pt able to state name and respond to questions with 1 word responses        Exercises Exercises: Other exercises General Exercises - Lower Extremity Short Arc Quad: PROM;AAROM;Both;5 reps;Seated (AAROM on L, PROM on R) Other Exercises Other Exercises: Pt bed placed in chair position and assist from therapist to bring trunk off bed. Able to maintain position for ~5 min at a time before fatiguing. Repeated several times during session while performing ADL. Reaching activities to the right performed to shift pt back to midline as he has a L lateral lean in bed.      General Comments Pt more alert and awake upon arrival but noted increased lethargy throughout session    Pertinent Vitals/ Pain       Pain Assessment: No/denies pain Faces Pain Scale: Hurts little more Pain Location: BIL Feet when touched Pain Descriptors / Indicators: Discomfort;Grimacing;Moaning Pain Intervention(s): Monitored during session;Repositioned;Limited activity within patient's tolerance         Frequency  Min 2X/week        Progress Toward Goals  OT Goals(current goals can now be found in the care plan section)  Progress towards OT goals: Progressing toward goals  Acute Rehab OT Goals Patient Stated Goal: none stated OT Goal Formulation: With patient Time For Goal Achievement: 04/30/20 Potential to Achieve Goals: [place] Discharge plan remains appropriate    Co-evaluation      Reason for Co-Treatment: Complexity of the patient's impairments (multi-system involvement);Necessary to address cognition/behavior during functional activity;For patient/therapist safety;To address functional/ADL transfers PT goals  addressed during session: Mobility/safety with mobility;Strengthening/ROM OT goals addressed during session: ADL's and self-care;Proper use of Adaptive equipment and DME;Strengthening/ROM      AM-PAC OT "6 Clicks" Daily Activity     Outcome Measure   Help from another person eating meals?: Total Help from another person taking care of personal grooming?: A Lot Help from another person toileting, which includes using toliet, bedpan, or urinal?: Total Help from another person bathing (including washing, rinsing, drying)?: Total Help from another person to put on and taking off regular upper body clothing?: A Lot Help from another person to put on and taking off regular lower body clothing?: Total 6 Click Score: 8    End of Session    OT Visit Diagnosis: Muscle weakness (generalized) (M62.81);Pain;Other symptoms and signs involving the nervous system (R29.898) Pain - part of body:  (BLE)   Activity Tolerance Patient limited by lethargy   Patient Left in bed;with call bell/phone within reach;with bed alarm set   Nurse Communication Mobility status;Other (comment) (bed in chair position)        Time: 1004-1030 OT Time Calculation (min): 26 min  Charges: OT General Charges $OT Visit: 1 Visit OT Treatments $Self Care/Home Management : 8-22 mins  Vila Dory/OTS   Teleshia Lemere 04/19/2020, 12:28 PM

## 2020-04-19 NOTE — Progress Notes (Signed)
Admit: 04/10/2020 LOS: 9  65M new ESRD, seizures, HIV/AIDS  Subjective:  . NAEON,  . HD yesterday 2L UF . No c/o   07/28 0701 - 07/29 0700 In: -  Out: 2400 [Urine:400]  Filed Weights   04/17/20 0500 04/18/20 2043 04/19/20 0026  Weight: 68.9 kg 68.4 kg 67.4 kg    Scheduled Meds: . amLODipine  5 mg Oral Daily  . carvedilol  3.125 mg Oral BID WC  . Chlorhexidine Gluconate Cloth  6 each Topical Q0600  . clopidogrel  75 mg Oral Daily  . darbepoetin (ARANESP) injection - DIALYSIS  40 mcg Intravenous Q Wed-HD  . dolutegravir  50 mg Oral Daily  . feeding supplement (NEPRO CARB STEADY)  237 mL Oral TID BM  . heparin injection (subcutaneous)  5,000 Units Subcutaneous Q8H  . insulin aspart  0-9 Units Subcutaneous Q4H  . lamiVUDine  300 mg Oral Daily  . levETIRAcetam  500 mg Oral Q12H  . lidocaine-EPINEPHrine  20 mL Intradermal Once  . LORazepam  2 mg Intravenous Once  . pantoprazole  40 mg Oral Daily  . pravastatin  80 mg Oral QHS  . sulfamethoxazole-trimethoprim  1 tablet Oral Once per day on Mon Wed Fri  . tamsulosin  0.4 mg Oral Daily   Continuous Infusions: . ferric gluconate (FERRLECIT/NULECIT) IV 125 mg (04/18/20 2323)  . levETIRAcetam 500 mg (04/19/20 1059)   PRN Meds:.hydrALAZINE, labetalol  Current Labs: reviewed  Results for Richard, [name] (MRN [id number]) as of 04/18/2020 12:10  Ref. Range 04/13/2020 11:13  Saturation Ratios Latest Ref Range: 17.9 - 39.5 % 9 (L)  Ferritin Latest Ref Range: 24 - 336 ng/mL 1,130 (H)    Physical Exam:  Blood pressure 123/70, pulse 85, temperature (!) 96.5 F (35.8 C), temperature source Axillary, resp. rate 20, weight 67.4 kg, SpO2 100 %. Chronically ill appearing NAD RRR AAO to self, 2012 CTAB S/nt/nd No sig LEE IJ TDC C/D/I   A 1. New ESRD,  1. using TDC, not medically stable for AVF/G; VVS following 2. MWF Schedule: 3.5h, 400/600, tight hep, 3K 2-3L UF 3. CLIP in process 2. Seizure disorder 3. HTN/Vol, BPs  up 4. CKD-BMD Ca and P ok, no binders 5. Anemia, on ESA, Fe 6. HIV/AIDs, [facility] notes reviewed  P . HD on schedule . Check PTH . Trend BPs . Cotn ESA and Fe . Medication Issues; o Preferred narcotic agents for pain control are hydromorphone, fentanyl, and methadone. Morphine should not be used.  o Baclofen should be avoided o Avoid oral sodium phosphate and magnesium citrate based laxatives / bowel preps    [name] MD 04/19/2020, 11:21 AM  Recent Labs  Lab 04/14/20 1137 04/14/20 1944 04/15/20 0452 04/16/20 0740 04/18/20 0343  NA 143  --   --  139 137  K 3.8  --   --  3.5 3.6  CL 110  --   --  103 100  CO2 21*  --   --  25 26  GLUCOSE 107*  --   --  106* 132*  BUN 55*  --   --  48* 55*  CREATININE 7.74*  --   --  6.56* 5.79*  CALCIUM 8.0*  --   --  7.7* 7.9*  PHOS  --    < > 3.4 3.5 2.8   < > = values in this interval not displayed.   Recent Labs  Lab 04/14/20 1137 04/16/20 0740 04/18/20 0343  WBC 4.9 4.4 4.6  NEUTROABS  3.6  --   --   HGB 9.2* 8.4* 8.9*  HCT 27.5* 26.4* 27.2*  MCV 87.0 91.0 90.4  PLT 180 155 125*

## 2020-04-20 DIAGNOSIS — E119 Type 2 diabetes mellitus without complications: Secondary | ICD-10-CM | POA: Diagnosis not present

## 2020-04-20 DIAGNOSIS — N185 Chronic kidney disease, stage 5: Secondary | ICD-10-CM | POA: Diagnosis not present

## 2020-04-20 DIAGNOSIS — N186 End stage renal disease: Secondary | ICD-10-CM | POA: Diagnosis not present

## 2020-04-20 DIAGNOSIS — R7989 Other specified abnormal findings of blood chemistry: Secondary | ICD-10-CM | POA: Diagnosis not present

## 2020-04-20 LAB — GLUCOSE, CAPILLARY
Glucose-Capillary: 53 mg/dL — ABNORMAL LOW (ref 70–99)
Glucose-Capillary: 159 mg/dL — ABNORMAL HIGH (ref 70–99)
Glucose-Capillary: 73 mg/dL (ref 70–99)
Glucose-Capillary: 116 mg/dL — ABNORMAL HIGH (ref 70–99)
Glucose-Capillary: 101 mg/dL — ABNORMAL HIGH (ref 70–99)
Glucose-Capillary: 89 mg/dL (ref 70–99)
Glucose-Capillary: 160 mg/dL — ABNORMAL HIGH (ref 70–99)

## 2020-04-20 LAB — CBC
HCT: 27.9 % — ABNORMAL LOW (ref 39.0–52.0)
Hemoglobin: 8.9 g/dL — ABNORMAL LOW (ref 13.0–17.0)
MCH: 29.2 pg (ref 26.0–34.0)
MCHC: 31.9 g/dL (ref 30.0–36.0)
MCV: 91.5 fL (ref 80.0–100.0)
Platelets: 174 10*3/uL (ref 150–400)
RBC: 3.05 MIL/uL — ABNORMAL LOW (ref 4.22–5.81)
RDW: 13.3 % (ref 11.5–15.5)
WBC: 5.6 10*3/uL (ref 4.0–10.5)
nRBC: 0 % (ref 0.0–0.2)

## 2020-04-20 LAB — RENAL FUNCTION PANEL
Albumin: 2.5 g/dL — ABNORMAL LOW (ref 3.5–5.0)
Anion gap: 11 (ref 5–15)
BUN: 38 mg/dL — ABNORMAL HIGH (ref 8–23)
CO2: 27 mmol/L (ref 22–32)
Calcium: 8.4 mg/dL — ABNORMAL LOW (ref 8.9–10.3)
Chloride: 99 mmol/L (ref 98–111)
Creatinine, Ser: 5.77 mg/dL — ABNORMAL HIGH (ref 0.61–1.24)
GFR calc Af Amer: 11 mL/min — ABNORMAL LOW (ref >60)
GFR calc non Af Amer: 10 mL/min — ABNORMAL LOW (ref >60)
Glucose, Bld: 120 mg/dL — ABNORMAL HIGH (ref 70–99)
Phosphorus: 3.9 mg/dL (ref 2.5–4.6)
Potassium: 3.4 mmol/L — ABNORMAL LOW (ref 3.5–5.1)
Sodium: 137 mmol/L (ref 135–145)

## 2020-04-20 LAB — HEPATITIS B SURFACE ANTIBODY,QUALITATIVE: Hep B S Ab: NONREACTIVE

## 2020-04-20 LAB — HEPATITIS B CORE ANTIBODY, TOTAL: Hep B Core Total Ab: NONREACTIVE

## 2020-04-20 MED ORDER — HEPARIN SODIUM (PORCINE) 1000 UNIT/ML DIALYSIS: 20.0000 [IU]/kg | INTRAMUSCULAR | Status: DC | PRN

## 2020-04-20 MED ORDER — INSULIN ASPART 100 UNIT/ML ~~LOC~~ SOLN
0.0000 [IU] | Freq: Three times a day (TID) | SUBCUTANEOUS | Status: DC
  Administered 2020-04-21: 2 [IU] via SUBCUTANEOUS
  Administered 2020-04-21: 1 [IU] via SUBCUTANEOUS
  Administered 2020-04-22 (×3): 2 [IU] via SUBCUTANEOUS
  Administered 2020-04-23: 5 [IU] via SUBCUTANEOUS
  Administered 2020-04-25 – 2020-04-27 (×2): 2 [IU] via SUBCUTANEOUS
  Administered 2020-04-27: 3 [IU] via SUBCUTANEOUS
  Administered 2020-04-28 – 2020-04-29 (×2): 2 [IU] via SUBCUTANEOUS
  Administered 2020-04-29 – 2020-04-30 (×2): 1 [IU] via SUBCUTANEOUS
  Administered 2020-04-30 – 2020-05-01 (×3): 2 [IU] via SUBCUTANEOUS
  Administered 2020-05-01: 3 [IU] via SUBCUTANEOUS
  Administered 2020-05-02: 5 [IU] via SUBCUTANEOUS
  Administered 2020-05-03: 2 [IU] via SUBCUTANEOUS
  Administered 2020-05-04: 3 [IU] via SUBCUTANEOUS
  Administered 2020-05-04: 2 [IU] via SUBCUTANEOUS
  Administered 2020-05-05: 1 [IU] via SUBCUTANEOUS
  Administered 2020-05-05 – 2020-05-06 (×2): 2 [IU] via SUBCUTANEOUS
  Administered 2020-05-06: 1 [IU] via SUBCUTANEOUS
  Administered 2020-05-07 (×2): 2 [IU] via SUBCUTANEOUS

## 2020-04-20 MED ORDER — DEXTROSE 50 % IV SOLN
INTRAVENOUS | Status: AC
  Administered 2020-04-20: 25 mL via INTRAVENOUS
  Filled 2020-04-20: qty 50

## 2020-04-20 NOTE — Progress Notes (Signed)
PT Cancellation Note  Patient Details Name: Richard [name] MRN: [id number] DOB: Dec 23, 1955   Cancelled Treatment:    Reason Eval/Treat Not Completed: Patient at procedure or test/unavailable Pt off floor at HD. Will follow.   Marguarite Arbour A Nazaret Chea 04/20/2020, 11:45 AM Marisa Severin, PT, DPT Acute Rehabilitation Services Pager [phone] Office [phone]

## 2020-04-20 NOTE — Progress Notes (Signed)
Patient returned to floor from dialysis.

## 2020-04-20 NOTE — Progress Notes (Signed)
SLP Cancellation Note  Patient Details Name: Richard [name] MRN: [id number] DOB: Apr 05, 1956   Cancelled treatment:       Reason Eval/Treat Not Completed: Patient at procedure or test/unavailable (Pt at HD at this time. SLP will f/u)  Tobie Poet I. Hardin Negus, [place], [place] Office number [phone] Pager [place] 04/20/2020, 11:59 AM

## 2020-04-20 NOTE — Procedures (Signed)
I was present at this dialysis session. I have reviewed the session itself and made appropriate changes.   Move to [place].  TDC. UF goal 3L. Qb 400. CLIP/Dispo in process.    Filed Weights   04/18/20 2043 04/19/20 0026 04/20/20 0319  Weight: 68.4 kg 67.4 kg 70 kg    Recent Labs  Lab 04/18/20 0343  NA 137  K 3.6  CL 100  CO2 26  GLUCOSE 132*  BUN 55*  CREATININE 5.79*  CALCIUM 7.9*  PHOS 2.8    Recent Labs  Lab 04/14/20 1137 04/16/20 0740 04/18/20 0343  WBC 4.9 4.4 4.6  NEUTROABS 3.6  --   --   HGB 9.2* 8.4* 8.9*  HCT 27.5* 26.4* 27.2*  MCV 87.0 91.0 90.4  PLT 180 155 125*    Scheduled Meds:  amLODipine  5 mg Oral Daily   carvedilol  3.125 mg Oral BID WC   Chlorhexidine Gluconate Cloth  6 each Topical Q0600   clopidogrel  75 mg Oral Daily   darbepoetin (ARANESP) injection - DIALYSIS  40 mcg Intravenous Q Wed-HD   dolutegravir  50 mg Oral Daily   feeding supplement (NEPRO CARB STEADY)  237 mL Oral TID BM   heparin injection (subcutaneous)  5,000 Units Subcutaneous Q8H   insulin aspart  0-9 Units Subcutaneous Q4H   lamiVUDine  300 mg Oral Daily   levETIRAcetam  500 mg Oral Q12H   lidocaine-EPINEPHrine  20 mL Intradermal Once   LORazepam  2 mg Intravenous Once   pantoprazole  40 mg Oral Daily   pravastatin  80 mg Oral QHS   sulfamethoxazole-trimethoprim  1 tablet Oral Once per day on Mon Wed Fri   tamsulosin  0.4 mg Oral Daily   Continuous Infusions:  ferric gluconate (FERRLECIT/NULECIT) IV 125 mg (04/18/20 2323)   levETIRAcetam 500 mg (04/19/20 1059)   PRN Meds:.hydrALAZINE, labetalol   [name]  MD 04/20/2020, 9:45 AM

## 2020-04-20 NOTE — Progress Notes (Signed)
[facility] for Infectious Disease  Date of Admission:  04/10/2020     Total days of antibiotics 1         ASSESSMENT:  Mr. Boeve appears to have waxing and waning mental status as he is more alert and conversive today. Appears to be tolerating the Tivicay and Lamivudine. Have arranged for 30 day supply of [facility]. Will need to also take Bactrim for OI prophylaxis as he is at risk for opportunistic infection. Will arrange follow up in ID clinic upon discharge. Continue current dose Tivicay and Lamivudine supplemented with Bactrim.   PLAN:  1. Continue with Tivicay and Lamivudine supplemented with bactrim.  2. Arrange follow up in ID office.  Dr. Baxter Flattery is available over the weekend for any ID related questions.   Principal Problem:   Encephalopathy Active Problems:   Tobacco abuse   Malnutrition of moderate degree (HCC)   Hyperlipidemia   Neuropathy in diabetes (HCC)   GERD (gastroesophageal reflux disease)   Diabetes mellitus, type II, insulin dependent (HCC)   Altered mental status   CKD (chronic kidney disease) stage 5, GFR less than 15 ml/min (HCC)   History of cerebrovascular accident (CVA) with residual deficit   Seizure ([place])   AIDS (acquired immune deficiency syndrome) ([place])   ESRD (end stage renal disease) ([place])   Creatinine elevation   Goals of care, counseling/discussion   Hypertensive urgency   Meningoencephalitis   Palliative care by specialist   . amLODipine  5 mg Oral Daily  . carvedilol  3.125 mg Oral BID WC  . Chlorhexidine Gluconate Cloth  6 each Topical Q0600  . clopidogrel  75 mg Oral Daily  . darbepoetin (ARANESP) injection - DIALYSIS  40 mcg Intravenous Q Wed-HD  . dolutegravir  50 mg Oral Daily  . feeding supplement (NEPRO CARB STEADY)  237 mL Oral TID BM  . heparin injection (subcutaneous)  5,000 Units Subcutaneous Q8H  . insulin aspart  0-9 Units Subcutaneous Q4H  . lamiVUDine  300 mg Oral Daily   . levETIRAcetam  500 mg Oral Q12H  . lidocaine-EPINEPHrine  20 mL Intradermal Once  . LORazepam  2 mg Intravenous Once  . pantoprazole  40 mg Oral Daily  . pravastatin  80 mg Oral QHS  . sulfamethoxazole-trimethoprim  1 tablet Oral Once per day on Mon Wed Fri  . tamsulosin  0.4 mg Oral Daily    SUBJECTIVE:  Afebrile overnight with no acute events. Feeling okay today. Getting ready for dialysis.   No Known Allergies   Review of Systems: Review of Systems  Constitutional: Negative for chills, fever and weight loss.  Respiratory: Negative for cough, shortness of breath and wheezing.   Cardiovascular: Negative for chest pain and leg swelling.  Gastrointestinal: Negative for abdominal pain, constipation, diarrhea, nausea and vomiting.  Skin: Negative for rash.      OBJECTIVE: Vitals:   04/20/20 0928 04/20/20 0945 04/20/20 1015 04/20/20 1045  BP: (!) 144/83 (!) 135/95 110/72 92/66  Pulse: 96 95 96 99  Resp: 12 14 12    Temp:      TempSrc:      SpO2:  100% 98%   Weight:       Body mass index is 19.76 kg/m.  Physical Exam Constitutional:      General: He is not in acute distress.    Appearance: He is well-developed.     Comments: Lying in bed with head of bed elevated; pleasant; eating breakfast  Cardiovascular:     Rate and Rhythm: Normal rate and regular rhythm.     Heart sounds: Normal heart sounds.  Pulmonary:     Effort: Pulmonary effort is normal.     Breath sounds: Normal breath sounds.  Skin:    General: Skin is warm and dry.  Neurological:     Mental Status: He is alert.     Lab Results Lab Results  Component Value Date   WBC 5.6 04/20/2020   HGB 8.9 (L) 04/20/2020   HCT 27.9 (L) 04/20/2020   MCV 91.5 04/20/2020   PLT 174 04/20/2020    Lab Results  Component Value Date   CREATININE 5.77 (H) 04/20/2020   BUN 38 (H) 04/20/2020   NA 137 04/20/2020   K 3.4 (L) 04/20/2020   CL 99 04/20/2020   CO2 27 04/20/2020    Lab Results  Component Value  Date   ALT 8 04/14/2020   AST 19 04/14/2020   ALKPHOS 79 04/14/2020   BILITOT 0.4 04/14/2020     Microbiology: Recent Results (from the past 240 hour(s))  SARS Coronavirus 2 by RT PCR (hospital order, performed in [facility] hospital lab) Nasopharyngeal Nasopharyngeal Swab     Status: None   Collection Time: 04/11/20  3:58 AM   Specimen: Nasopharyngeal Swab  Result Value Ref Range Status   SARS Coronavirus 2 NEGATIVE NEGATIVE Final    Comment: (NOTE) SARS-CoV-2 target nucleic acids are NOT DETECTED.  The SARS-CoV-2 RNA is generally detectable in upper and lower respiratory specimens during the acute phase of infection. The lowest concentration of SARS-CoV-2 viral copies this assay can detect is 250 copies / mL. A negative result does not preclude SARS-CoV-2 infection and should not be used as the sole basis for treatment or other patient management decisions.  A negative result may occur with improper specimen collection / handling, submission of specimen other than nasopharyngeal swab, presence of viral mutation(s) within the areas targeted by this assay, and inadequate number of viral copies (<250 copies / mL). A negative result must be combined with clinical observations, patient history, and epidemiological information.  Fact Sheet for Patients:   StrictlyIdeas.no  Fact Sheet for Healthcare Providers: BankingDealers.co.za  This test is not yet approved or  cleared by the [place] FDA and has been authorized for detection and/or diagnosis of SARS-CoV-2 by FDA under an Emergency Use Authorization (EUA).  This EUA will remain in effect (meaning this test can be used) for the duration of the COVID-19 declaration under Section 564(b)(1) of the Act, 21 U.S.C. section 360bbb-3(b)(1), unless the authorization is terminated or revoked sooner.  Performed at [facility], [place] [address] [place], [place] [address]    Culture, blood (routine x 2)     Status: None   Collection Time: 04/14/20 11:31 AM   Specimen: BLOOD RIGHT HAND  Result Value Ref Range Status   Specimen Description BLOOD RIGHT HAND  Final   Special Requests   Final    BOTTLES DRAWN AEROBIC ONLY Blood Culture results may not be optimal due to an inadequate volume of blood received in culture bottles   Culture   Final    NO GROWTH 5 DAYS Performed at [facility], [place] [address], [place], [place] [address]    Report Status 04/19/2020 FINAL  Final  Culture, blood (routine x 2)     Status: None   Collection Time: 04/14/20 11:36 AM   Specimen: BLOOD RIGHT HAND  Result Value  Ref Range Status   Specimen Description BLOOD RIGHT HAND  Final   Special Requests   Final    BOTTLES DRAWN AEROBIC ONLY Blood Culture results may not be optimal due to an inadequate volume of blood received in culture bottles   Culture   Final    NO GROWTH 5 DAYS Performed at [facility], [place] [address] [place], [place] [address]    Report Status 04/19/2020 FINAL  Final  CSF culture     Status: None   Collection Time: 04/14/20  6:27 PM   Specimen: PATH Cytology CSF; Cerebrospinal Fluid  Result Value Ref Range Status   Specimen Description CSF  Final   Special Requests NONE  Final   Gram Stain NO WBC SEEN NO ORGANISMS SEEN CYTOSPIN SMEAR   Final   Culture   Final    NO GROWTH 3 DAYS Performed at [facility], [address], [place], [place] [address]    Report Status 04/18/2020 FINAL  Final  Fungus Stain     Status: None   Collection Time: 04/14/20  6:27 PM   Specimen: PATH Cytology CSF; Cerebrospinal Fluid  Result Value Ref Range Status   FUNGUS STAIN Final report  Final    Comment: (NOTE) Performed At: [facility] [address] [place], [place] [id number] Rush Farmer MD GM:[id number]    Fungal Source CSF  Final    Comment: Performed at [facility], [place] [address], [place], [place] [address]  Fungal Stain  reflex     Status: None   Collection Time: 04/14/20  6:27 PM  Result Value Ref Range Status   Fungal stain result 1 Comment  Final    Comment: (NOTE) KOH/Calcofluor preparation:  no fungus observed. Performed At: [facility] [address] [place], [place] [id number] Rush Farmer MD VF:[id number]      Terri Piedra, [facility]ious [facility]  04/20/2020  11:43 AM

## 2020-04-20 NOTE — Progress Notes (Signed)
Patient off floor for dialysis.

## 2020-04-20 NOTE — TOC Progression Note (Signed)
Transition of Care [facility]) - Progression Note    Patient Details  Name: Richard [name] MRN: [id number] Date of Birth: Nov 14, 1955  Transition of Care [facility]) CM/SW [place], [place] Phone Number: 04/20/2020, 2:53 PM  Clinical Narrative:   CSW spoke with patient's daughter, Kathlee Nations, to discuss patient's court date on Monday. Per Kathlee Nations, letter of patient being in the hospital can be sent to the [facility] and they will deliver it as appropriate. CSW completed letter that patient is in the hospital and will not be discharged prior to Monday and faxed to [place].   CSW also discussed with Kathlee Nations patient's bed offers for SNF. Kathlee Nations to review options available. CSW to follow.    Expected Discharge Plan: [place] Barriers to Discharge: Continued Medical Work up, Waiting for outpatient dialysis  Expected Discharge Plan and Services Expected Discharge Plan: [place] Choice: [place] Living arrangements for the past 2 months: Single Family Home                                       Social Determinants of Health (SDOH) Interventions    Readmission Risk Interventions Readmission Risk Prevention Plan 02/15/2020  Transportation Screening Complete  PCP or Specialist Appt within 5-7 Days Not Complete  Not Complete comments pending disposition  Home Care Screening Complete  Medication Review (RN CM) Referral to Pharmacy  Some recent data might be hidden

## 2020-04-20 NOTE — Consult Note (Signed)
[facility] Surgery Consult Note  Richard [name] Sep 17, 1956  [id number].    Requesting MD: Avon Gully, MD Chief Complaint/Reason for Consult: punch biopsy  HPI:  Richard [name] is a 64 y/o M with multiple medical problems including, but not limited to, diabetes mellitus, HTN, ESRD, CVA, seizures, medical non-compliance, and immune deficiency who presented to the ED 7/20 after his roommate witness a seizure. Per chart review seizure last 4 minutes. In the ED the patient was hypertensive, encephalopathic, and post-ictal. Imaging revealed a parietal infarct. He was admitted by the medical service for further management. His HIV antibody was performed routinely in the ED during his workup and came back positive. 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 General surgery has been consulted to perform punch biopsy.  ROS: Review of Systems  Constitutional: Positive for malaise/fatigue and weight loss.  Respiratory: Positive for cough.   Skin: Positive for itching and rash.  Neurological: Positive for seizures.  All other systems reviewed and are negative.   Family History  Problem Relation Age of Onset   Other Mother    Stroke Father    Hypertension Father    Epilepsy Father    Breast cancer Sister    Stroke Brother    Heart attack Brother    Diabetes Sister     Past Medical History:  Diagnosis Date   Arthritis    Diabetes mellitus, type II, insulin dependent ([place])    GERD (gastroesophageal reflux disease)    Hypertension    Hypertensive emergency 05/21/2012   Immune deficiency disorder ([place])    Migraines    Neuropathy in diabetes ([place])    bilat feet   Ruptured lumbar disc    Stroke ([place]) 2013   residual right sided weakness (arm>leg)     Past Surgical History:  Procedure Laterality Date   IR FLUORO GUIDE CV LINE RIGHT  04/13/2020   IR US GUIDE VASC ACCESS RIGHT  04/13/2020   KNEE ARTHROSCOPY Bilateral     Social History:  reports that he has been smoking cigarettes. He has been smoking about 0.50 packs per day. He has never used smokeless tobacco. He reports current drug use. Drug: Marijuana. He reports that he does not drink alcohol.  Allergies: No Known Allergies  Medications Prior to Admission  Medication Sig Dispense Refill   amLODipine (NORVASC) 10 MG tablet TAKE 1 TABLET BY MOUTH DAILY (Patient taking differently: Take 10 mg by mouth daily. ) 30 tablet 0   calcitRIOL (ROCALTROL) 0.25 MCG capsule Take 1 capsule (0.25 mcg total) by mouth daily. 30 capsule 0   clopidogrel (PLAVIX) 75 MG tablet Take 1 tablet (75 mg total) by mouth daily. 90 tablet 0   furosemide (LASIX) 40 MG tablet Take 1 tablet (40 mg total) by mouth daily. 90 tablet 0   hydrALAZINE (APRESOLINE) 25 MG tablet Take 1 tablet (25 mg total) by mouth every 8 (eight) hours. 270 tablet 0   hydrocortisone cream 1 % Apply 1 application topically 4 (four) times daily as needed for itching.     pantoprazole (PROTONIX) 40 MG tablet TAKE 1 TABLET BY MOUTH EVERY DAY (Patient taking differently: Take 40 mg by mouth daily. ) 90 tablet 0   tamsulosin (FLOMAX) 0.4 MG CAPS capsule TAKE 1 CAPSULE BY MOUTH EVERY DAY (Patient  taking differently: Take 0.4 mg by mouth daily. ) 30 capsule 0   ACCU-CHEK AVIVA PLUS test strip 1 each by Other route See admin instructions. Use 1 test strip to test blood sugar two-three times daily  1   ACCU-CHEK AVIVA PLUS test strip USE 1 strip TO test blood sugar 2 TIMES DAILY TO 3 TIMES DAILY (Patient taking differently: 1 each by Other route in the morning, at noon, and at bedtime. ) 100 strip 1   blood glucose meter kit and supplies KIT Dispense based on patient and insurance preference. Check blood sugar 4 times a day (Patient  taking differently: Inject 1 each into the skin See admin instructions. Dispense based on patient and insurance preference. Check blood sugar 4 times a day) 1 each 0   chlorthalidone (HYGROTON) 25 MG tablet Take 1 tablet (25 mg total) by mouth daily. 90 tablet 0   Cholecalciferol (VITAMIN D) 50 MCG (2000 UT) tablet Take 1 tablet (2,000 Units total) by mouth in the morning. 30 tablet 0   cloNIDine (CATAPRES) 0.2 MG tablet Take 1 tablet (0.2 mg total) by mouth 2 (two) times daily. 180 tablet 0   famotidine (PEPCID) 20 MG tablet Take 1 tablet (20 mg total) by mouth 2 (two) times daily. 180 tablet 0   hydrOXYzine (ATARAX/VISTARIL) 25 MG tablet Take 1 tablet (25 mg total) by mouth every 8 (eight) hours as needed for itching. 12 tablet 0   insulin aspart protamine - aspart (NOVOLOG MIX 70/30 FLEXPEN) (70-30) 100 UNIT/ML FlexPen INJECT 28 UNITS INTO THE SKIN EVERY MORNING (Patient taking differently: Inject 28 Units into the skin in the morning. ) 15 mL 0   Insulin Pen Needle (SURE COMFORT PEN NEEDLES) 31G X 8 MM MISC Use as directed twice daily with Novolog flex pen (Patient taking differently: 1 each by Other route See admin instructions. Use as directed twice daily with Novolog flex pen) 100 each 3   levETIRAcetam (KEPPRA) 500 MG tablet Take 1 tablet (500 mg total) by mouth 2 (two) times daily. 60 tablet 0   Misc. Devices (COMMODE 3-IN-1) MISC Use as directed 1 each 0   Misc. Devices (TRANSFER BENCH) MISC Use as directed 1 each 0   NARCAN 4 MG/0.1ML LIQD nasal spray kit Place 0.4 mg into the nose once.      Oxycodone HCl 20 MG TABS Take 20 mg by mouth every 6 (six) hours as needed (pain).      potassium chloride SA (KLOR-CON) 20 MEQ tablet Take 20 mEq by mouth daily.      pravastatin (PRAVACHOL) 80 MG tablet Take 1 tablet (80 mg total) by mouth at bedtime. 90 tablet 0   sildenafil (REVATIO) 20 MG tablet Take 20-100 mg by mouth as needed for erectile dysfunction.     TRUEPLUS INSULIN  SYRINGE 31G X 5/16" 1 ML MISC Use pen needle with insulin 2 times daily (Patient taking differently: 1 each by Other route in the morning and at bedtime. With insulin) 100 each 1    Blood pressure (!) 130/81, pulse 87, temperature 97.8 F (36.6 C), temperature source Oral, resp. rate 19, weight 64.9 kg, SpO2 98 %. Physical Exam: Constitutional: NAD; somnolent but arouses to voice, appears chronically ill with diffuse muscle wasting Eyes: Moist conjunctiva; no lid lag; anicteric; PERRL Mouth: poor dentition  Neck: Trachea midline; no thyromegaly Lungs: Normal respiratory effort; no tactile fremitus CV: RRR; no palpable thrills; no pitting edema GI: Abd soft, non-tender, no palpable hepatosplenomegaly MSK: symmetrical;  no clubbing/cyanosis Psychiatric: unable to assess due to somnolence, A&Ox2 Lymphatic: No palpable cervical or axillary lymphadenopathy Integumentary: diffuse papular rash, hyperpigmented, non-tender, no drainage    Results for orders placed or performed during the hospital encounter of 04/10/20 (from the past 48 hour(s))  Glucose, capillary     Status: None   Collection Time: 04/18/20  4:00 PM  Result Value Ref Range   Glucose-Capillary 77 70 - 99 mg/dL    Comment: Glucose reference range applies only to samples taken after fasting for at least 8 hours.  Glucose, capillary     Status: Abnormal   Collection Time: 04/18/20  7:28 PM  Result Value Ref Range   Glucose-Capillary 155 (H) 70 - 99 mg/dL    Comment: Glucose reference range applies only to samples taken after fasting for at least 8 hours.  Glucose, capillary     Status: None   Collection Time: 04/19/20  1:19 AM  Result Value Ref Range   Glucose-Capillary 89 70 - 99 mg/dL    Comment: Glucose reference range applies only to samples taken after fasting for at least 8 hours.  Glucose, capillary     Status: None   Collection Time: 04/19/20  3:26 AM  Result Value Ref Range   Glucose-Capillary 75 70 - 99 mg/dL     Comment: Glucose reference range applies only to samples taken after fasting for at least 8 hours.   Comment 1 Notify RN    Comment 2 Document in Chart   Glucose, capillary     Status: None   Collection Time: 04/19/20  8:35 AM  Result Value Ref Range   Glucose-Capillary 72 70 - 99 mg/dL    Comment: Glucose reference range applies only to samples taken after fasting for at least 8 hours.   Comment 1 Notify RN    Comment 2 Document in Chart   Glucose, capillary     Status: Abnormal   Collection Time: 04/19/20 11:10 AM  Result Value Ref Range   Glucose-Capillary 125 (H) 70 - 99 mg/dL    Comment: Glucose reference range applies only to samples taken after fasting for at least 8 hours.   Comment 1 Notify RN    Comment 2 Document in Chart   Glucose, capillary     Status: Abnormal   Collection Time: 04/19/20  4:26 PM  Result Value Ref Range   Glucose-Capillary 65 (L) 70 - 99 mg/dL    Comment: Glucose reference range applies only to samples taken after fasting for at least 8 hours.   Comment 1 Notify RN    Comment 2 Document in Chart   Glucose, capillary     Status: Abnormal   Collection Time: 04/19/20  7:26 PM  Result Value Ref Range   Glucose-Capillary 284 (H) 70 - 99 mg/dL    Comment: Glucose reference range applies only to samples taken after fasting for at least 8 hours.   Comment 1 Notify RN    Comment 2 Document in Chart   Glucose, capillary     Status: None   Collection Time: 04/19/20 11:11 PM  Result Value Ref Range   Glucose-Capillary 92 70 - 99 mg/dL    Comment: Glucose reference range applies only to samples taken after fasting for at least 8 hours.   Comment 1 Notify RN    Comment 2 Document in Chart   Glucose, capillary     Status: Abnormal   Collection Time: 04/20/20  3:19 AM  Result Value Ref  Range   Glucose-Capillary 53 (L) 70 - 99 mg/dL    Comment: Glucose reference range applies only to samples taken after fasting for at least 8 hours.   Comment 1 Notify RN     Comment 2 Document in Chart   Glucose, capillary     Status: Abnormal   Collection Time: 04/20/20  3:54 AM  Result Value Ref Range   Glucose-Capillary 159 (H) 70 - 99 mg/dL    Comment: Glucose reference range applies only to samples taken after fasting for at least 8 hours.   Comment 1 Notify RN    Comment 2 Document in Chart   Glucose, capillary     Status: None   Collection Time: 04/20/20  8:34 AM  Result Value Ref Range   Glucose-Capillary 73 70 - 99 mg/dL    Comment: Glucose reference range applies only to samples taken after fasting for at least 8 hours.   Comment 1 Notify RN    Comment 2 Document in Chart   CBC     Status: Abnormal   Collection Time: 04/20/20  9:58 AM  Result Value Ref Range   WBC 5.6 4.0 - 10.5 K/uL   RBC 3.05 (L) 4.22 - 5.81 MIL/uL   Hemoglobin 8.9 (L) 13.0 - 17.0 g/dL   HCT 27.9 (L) 39 - 52 %   MCV 91.5 80.0 - 100.0 fL   MCH 29.2 26.0 - 34.0 pg   MCHC 31.9 30.0 - 36.0 g/dL   RDW 13.3 11.5 - 15.5 %   Platelets 174 150 - 400 K/uL   nRBC 0.0 0.0 - 0.2 %    Comment: Performed at [facility], [place] [address], [place], [place] [address]  Renal function panel     Status: Abnormal   Collection Time: 04/20/20  9:58 AM  Result Value Ref Range   Sodium 137 135 - 145 mmol/L   Potassium 3.4 (L) 3.5 - 5.1 mmol/L   Chloride 99 98 - 111 mmol/L   CO2 27 22 - 32 mmol/L   Glucose, Bld 120 (H) 70 - 99 mg/dL    Comment: Glucose reference range applies only to samples taken after fasting for at least 8 hours.   BUN 38 (H) 8 - 23 mg/dL   Creatinine, Ser 5.77 (H) 0.61 - 1.24 mg/dL   Calcium 8.4 (L) 8.9 - 10.3 mg/dL   Phosphorus 3.9 2.5 - 4.6 mg/dL   Albumin 2.5 (L) 3.5 - 5.0 g/dL   GFR calc non Af Amer 10 (L) >60 mL/min   GFR calc Af Amer 11 (L) >60 mL/min   Anion gap 11 5 - 15    Comment: Performed at [facility] [address] [place], [place] [address]  Hepatitis B surface antibody,qualitative     Status: None   Collection Time: 04/20/20 11:16 AM   Result Value Ref Range   Hep B S Ab NON REACTIVE NON REACTIVE    Comment: (NOTE) Inconsistent with immunity, less than 10 mIU/mL.  Performed at [facility], [place] [address], [place], [place] [address]   Hepatitis B core antibody, total     Status: None   Collection Time: 04/20/20 11:16 AM  Result Value Ref Range   Hep B Core Total Ab NON REACTIVE NON REACTIVE    Comment: Performed at [facility] [address], [place], [place] [address]  Glucose, capillary     Status: Abnormal   Collection Time: 04/20/20 12:12 PM  Result Value Ref Range   Glucose-Capillary 116 (H) 70 - 99 mg/dL    Comment: Glucose reference range applies only to samples taken after fasting for at least 8 hours.   No results found.  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   Jill Alexanders, PA-C [facility] Please see Amion for pager number during day hours 7:00am-4:30pm 04/20/2020, 3:13 PM

## 2020-04-20 NOTE — Procedures (Signed)
PROCEDURE: punch biopsy x2 Performing Physician: Obie Dredge, PA-C Supervising Physician: Dr. Coralie Keens   The right upper arm had multiple skin lesions. The skin lesions were prepared and draped in the usual sterile manner. Two biopsies of two separate skin lesions were obtained using a 4 mm punch biopsy. Hemostasis was assured with direct pressure. The patient tolerated the procedure well. A dry dressing was applied.   Closure: None Complications: None Care: cover with dry dressing, clean daily with mild soap and water. For bleeding, hold direct pressure for 15 minutes.   Obie Dredge, [facility]

## 2020-04-20 NOTE — Progress Notes (Signed)
Inpatient Diabetes Program Recommendations  AACE/ADA: New Consensus Statement on Inpatient Glycemic Control (2015)  Target Ranges:  Prepandial:   less than 140 mg/dL      Peak postprandial:   less than 180 mg/dL (1-2 hours)      Critically ill patients:  140 - 180 mg/dL   Lab Results  Component Value Date   GLUCAP 73 04/20/2020   HGBA1C 5.2 02/14/2020    Review of Glycemic Control Results for Richard [name], Richard [name] (MRN [id number]) as of 04/20/2020 10:50  Ref. Range 04/19/2020 23:11 04/20/2020 03:19 04/20/2020 03:54 04/20/2020 08:34  Glucose-Capillary Latest Ref Range: 70 - 99 mg/dL 92 53 (L) 159 (H) 73   Diabetes history:  DM2 Outpatient Diabetes medications:  70/30 28 units daily  Current orders for Inpatient glycemic control:  Novolog 0-9 units q4h  Inpatient Diabetes Program Recommendations:     Novolog 0-6 units q4h as pt was 53mg /dl early this am   Will continue to follow while inpatient.  Thank you, Reche Dixon, RN, BSN Diabetes Coordinator Inpatient Diabetes Program [phone] (team pager from 8a-5p)

## 2020-04-20 NOTE — Progress Notes (Signed)
PROGRESS NOTE    Richard [name]  XTG:[id number] DOB: 09-Oct-1955 DOA: 04/10/2020 PCP: Richard Macadam, MD   Brief Narrative:  HPI On 04/10/2020 by Richard [name] Richard [name]  is a 64 y.o. male, with medical history significant ofdiabetes, osteoarthritis, hypertension,CKD 5,neuropathies, previous CVA with residual right-sided weakness, migraine headaches , patient with extremely poor compliance, he has refused hemodialysis access in the past, presents to ED via EMS status post seizures, he is known history of seizures, but has not been noncompliant with his seizure medications, seizures was witnessed by his roommate, seizures lasted for 4 minutes, she will patient's extremely encephalopathic, unable to provide any history, but upon my evaluation is more appropriate, patient report actually he never got his seizure medications, was not taking it, patient denies any fever, chest pain, shortness of breath, no dysuria or polyuria, reports usually ambulates with a walker, he has right-sided weakness at baseline.  Interim history: Admitted for AMS, found to have CVA and seizure. Neuro was consulted. Currently on AED. Vascular consulted for access placement as patient recently started HD for ESRD. Nephro continues to follow for dialysis.  NG tube removed on the 28th, tolerating diet continue to advance. Currently awaiting placement.   Assessment & Plan  Principal Problem:   Encephalopathy Active Problems:   Tobacco abuse   Malnutrition of moderate degree (HCC)   Hyperlipidemia   Neuropathy in diabetes (HCC)   GERD (gastroesophageal reflux disease)   Diabetes mellitus, type II, insulin dependent (HCC)   Altered mental status   CKD (chronic kidney disease) stage 5, GFR less than 15 ml/min (HCC)   History of cerebrovascular accident (CVA) with residual deficit   Seizure ([place])   AIDS (acquired immune deficiency syndrome) (HCC)   ESRD (end stage renal disease) ([place])    Creatinine elevation   Goals of care, counseling/discussion   Hypertensive urgency   Meningoencephalitis   Palliative care by specialist   Acute metabolic encephalopathy, multifactorial, POA, improving slowly - Likely multifactorial including seizure, hypertension, acute stroke, uremia - Patient continues to be somewhat somnolent but markedly improving from previous documentation  Acute CVA, POA, resolving - Also with history of CVA with residual right-sided weakness and chronic right upper extremity contracture - Initial CT scan on admission no acute findings - Due to acute mental status change on the morning of July 22, code stroke initiated, stat CT scan no acute findings, stat MRI showed "2 mm acute or early subacute infarct within the left parietal lobe periatrial white matter" - HIV screening positive - Status post Lumbar puncture on 7/24, CSF Gram stain negative. Cryptococcus PCR negative. VDRL CSF is pending. - PT, OT recommending SNF - Core track removed, now tolerating p.o. per speech continue to advance diet - Neurology consulted and appreciate insight and recommendations - Continue Plavix, statin  Failure to thrive, moderate caloric malnutrition Also care -Lengthy discussion with family today over the phone about patient's poor p.o. intake -Family indicates patient is very picky about diet and foods -we recommended that family bring the patient food over the weekend in hopes that he will increase his p.o. intake appropriately. -Unclear at this point if patient is not taking adequate because he is unhappy with his diet here or simply is physically unable to tolerate enough food to maintain caloric intake -We discussed that he may require NG tube or PEG tube placement if he continues to have poor p.o. intake -or we will have to discuss palliative care or hospice care as without  adequate calories he will continue to worsen weekend and ultimately decline in status.   Recurrent  seizures in the setting of noncomlpiance, POA - He is known to be noncompliant with his seizure medication - EEG July 24 "This study is suggestive of moderate diffuse encephalopathy, nonspecific etiology.No seizures or epileptiform discharges were seen throughout the recording" - Status post Lumbar puncture on 7/24, CSF Gram stain negative. Cryptococcus PCR negative. VDRL CSF is pending. - Previously on IV Keppra and IV Vimpat , now on iv keppra only per neurology recommendation - Appreciate neurology recommendation  Hypertensive urgency, present on admission - Resolved - Currently well controlled on Coreg  Chronic kidney disease, stage V now progressing to ESRD  - Creatinine in 10-11 range, creatinine appears to be at baseline. - Patient has refused dialysis in the past, agreeable to dialysis on presentation - Nephrology consulted and appreciated - IR placed dialysis catheter, tolerating well - Currently tolerating hemodialysis well - started on 04/13/2020 - Vascular surgery consulted and appreciated for placement of access  Anemia of chronic disease - Hemoglobin stable, continue to monitor CBC  HIV/AIDS, newly diagnosed  - CD4 count 39, viral load 624,000 - New diagnosis - Infectious disease consulted and appreciated-continue tivicay, epivir - Currently on bactrim  Dark skin lesions - Nontender, does not itch - Molluscum contagiosum ? Kaposi sarcoma? - Surgical biopsy performed seven 3021 x 2, awaiting path and culture  Diabetes mellitus, type II with episode of hypoglycemia -Controlled, hemoglobin A1c 5.2 (02/14/2020) - On Novolin 70/30 at home - Had hypoglycemia requiring D5 infusion briefly on 7/23,  - Continue insulin sliding scale with CBG monitoring  Hyperlipidemia - Total cholesterol 147, HDL 31, LDL 91, triglycerides 125 - Continue statin  Tobacco use - Cessation discussed by previous physician   History of severe noncompliance - Per previous  documentation, patient counseled  DVT Prophylaxis  Heparin Code Status: Full Family Communication: None at bedside Disposition Plan:  Status is: Inpatient  Remains inpatient appropriate because:Altered mental status, IV treatments appropriate due to intensity of illness or inability to take PO and Inpatient level of care appropriate due to severity of illness   Dispo: The patient is from: Home              Anticipated d/c is to: SNF              Anticipated d/c date is: > 3 days              Patient currently is not medically stable to d/c.   Consultants Nephrology Neurology Advanced radiology Infectious disease Palliative care General surgery  Procedures  Bedside lumbar puncture attempted July 24 by neurology Lumbar puncture by IR on July 24 Dialysis catheter placement by interventional radiology   Antibiotics   Anti-infectives (From admission, onward)   Start     Dose/Rate Route Frequency Ordered Stop   04/19/20 1000  lamiVUDine (EPIVIR) tablet 300 mg     Discontinue    Note to Pharmacy: I DO NOT renally dose lamivudine and I have never had problems NOT adjusting it. I want pt to be on Dovato but still not on formulary here (unfortunately)   300 mg Oral Daily 04/18/20 1043     04/17/20 0000  Dolutegravir-lamiVUDine (DOVATO) 50-300 MG TABS     Discontinue     1 tablet Oral Daily 04/17/20 1546     04/16/20 1000  lamiVUDine (EPIVIR) tablet 300 mg  Status:  Discontinued  Note to Pharmacy: I DO NOT renally dose lamivudine and I have never had problems NOT adjusting it. I want pt to be on Dovato but still not on formulary here (unfortunately)   300 mg Per Tube Daily 04/15/20 1307 04/18/20 1043   04/16/20 0900  sulfamethoxazole-trimethoprim (BACTRIM DS) 800-160 MG per tablet 1 tablet     Discontinue     1 tablet Oral Once per day on Mon Wed Fri 04/15/20 1350     04/15/20 1245  dolutegravir (TIVICAY) tablet 50 mg     Discontinue     50 mg Oral Daily 04/15/20 1202      04/15/20 1245  lamiVUDine (EPIVIR) tablet 300 mg  Status:  Discontinued       Note to Pharmacy: I DO NOT renally dose lamivudine and I have never had problems NOT adjusting it. I want pt to be on Dovato but still not on formulary here (unfortunately)   300 mg Oral Daily 04/15/20 1202 04/15/20 1307   04/13/20 1737  ceFAZolin (ANCEF) 2-4 GM/100ML-% IVPB       Note to Pharmacy: Desiree Hane   : cabinet override      04/13/20 1737 04/14/20 0544   04/13/20 0000  ceFAZolin (ANCEF) IVPB 2g/100 mL premix        2 g 200 mL/hr over 30 Minutes Intravenous To Radiology 04/12/20 1454 04/13/20 0834      Subjective:   No acute events or issues overnight, somnolent, review of systems somewhat limited but denies chest pain shortness of breath nausea vomiting or headache.  Objective:   Vitals:   04/19/20 1927 04/19/20 2309 04/20/20 0319 04/20/20 0339  BP: (!) 148/80 (!) 148/76 (!) 186/92 (!) 158/85  Pulse: 88 86 79   Resp: 18 14 18    Temp: 97.8 F (36.6 C) 97.9 F (36.6 C) 98 F (36.7 C)   TempSrc: Oral Oral Oral   SpO2: 100% 100% 100%   Weight:   70 kg     Intake/Output Summary (Last 24 hours) at 04/20/2020 0722 Last data filed at 04/19/2020 2309 Gross per 24 hour  Intake 360 ml  Output 200 ml  Net 160 ml   Filed Weights   04/18/20 2043 04/19/20 0026 04/20/20 0319  Weight: 68.4 kg 67.4 kg 70 kg   Exam  General: Well developed, chronically ill appearing, NAD  HEENT: NCAT,  mucous membranes moist. Cortrak   Cardiovascular: S1 S2 auscultated, RRR.  Respiratory: Clear to auscultation bilaterally   Abdomen: Soft, nontender, nondistended, + bowel sounds  Extremities: warm dry without cyanosis clubbing or edema, RUE contracture  Neuro: Sleepy, can answers some questions.   Skin: Diffuse rash  Data Reviewed: I have personally reviewed following labs and imaging studies  CBC: Recent Labs  Lab 04/14/20 1137 04/16/20 0740 04/18/20 0343  WBC 4.9 4.4 4.6  NEUTROABS 3.6  --    --   HGB 9.2* 8.4* 8.9*  HCT 27.5* 26.4* 27.2*  MCV 87.0 91.0 90.4  PLT 180 155 001*   Basic Metabolic Panel: Recent Labs  Lab 04/13/20 1113 04/13/20 1113 04/13/20 1829 04/13/20 1829 04/14/20 0511 04/14/20 1137 04/14/20 1944 04/15/20 0452 04/16/20 0740 04/18/20 0343  NA 148*  --   --   --   --  143  --   --  139 137  K 4.5  --   --   --   --  3.8  --   --  3.5 3.6  CL 119*  --   --   --   --  110  --   --  103 100  CO2 12*  --   --   --   --  21*  --   --  25 26  GLUCOSE 68*  --   --   --   --  107*  --   --  106* 132*  BUN 94*  --   --   --   --  55*  --   --  48* 55*  CREATININE 11.51*  --   --   --   --  7.74*  --   --  6.56* 5.79*  CALCIUM 8.1*  --   --   --  7.7* 8.0*  --   --  7.7* 7.9*  MG  --   --  1.6*  --  1.5*  --  1.6* 2.2  --   --   PHOS 7.6*   < > 8.3*   < > 4.4  --  2.5 3.4 3.5 2.8   < > = values in this interval not displayed.   GFR: CrCl cannot be calculated (Unknown ideal weight.). Liver Function Tests: Recent Labs  Lab 04/13/20 1113 04/14/20 1137 04/16/20 0740 04/18/20 0343  AST  --  19  --   --   ALT  --  8  --   --   ALKPHOS  --  79  --   --   BILITOT  --  0.4  --   --   PROT  --  6.1*  --   --   ALBUMIN 2.3* 2.3* 2.1* 2.2*   No results for input(s): LIPASE, AMYLASE in the last 168 hours. No results for input(s): AMMONIA in the last 168 hours. Coagulation Profile: Recent Labs  Lab 04/13/20 1113  INR 1.1   Cardiac Enzymes: No results for input(s): CKTOTAL, CKMB, CKMBINDEX, TROPONINI in the last 168 hours. BNP (last 3 results) Recent Labs    06/22/19 1505  PROBNP 170.0*   HbA1C: No results for input(s): HGBA1C in the last 72 hours. CBG: Recent Labs  Lab 04/19/20 1626 04/19/20 1926 04/19/20 2311 04/20/20 0319 04/20/20 0354  GLUCAP 65* 284* 92 53* 159*   Lipid Profile: No results for input(s): CHOL, HDL, LDLCALC, TRIG, CHOLHDL, LDLDIRECT in the last 72 hours. Thyroid Function Tests: No results for input(s): TSH, T4TOTAL,  FREET4, T3FREE, THYROIDAB in the last 72 hours. Anemia Panel: No results for input(s): VITAMINB12, FOLATE, FERRITIN, TIBC, IRON, RETICCTPCT in the last 72 hours. Urine analysis:    Component Value Date/Time   COLORURINE STRAW (A) 04/11/2020 0223   APPEARANCEUR CLEAR 04/11/2020 0223   LABSPEC 1.011 04/11/2020 0223   PHURINE 5.0 04/11/2020 0223   GLUCOSEU 50 (A) 04/11/2020 0223   HGBUR SMALL (A) 04/11/2020 0223   BILIRUBINUR NEGATIVE 04/11/2020 0223   KETONESUR NEGATIVE 04/11/2020 0223   PROTEINUR >=300 (A) 04/11/2020 0223   UROBILINOGEN 0.2 11/29/2014 2003   NITRITE NEGATIVE 04/11/2020 0223   LEUKOCYTESUR NEGATIVE 04/11/2020 0223    Recent Results (from the past 240 hour(s))  SARS Coronavirus 2 by RT PCR (hospital order, performed in [facility] hospital lab) Nasopharyngeal Nasopharyngeal Swab     Status: None   Collection Time: 04/11/20  3:58 AM   Specimen: Nasopharyngeal Swab  Result Value Ref Range Status   SARS Coronavirus 2 NEGATIVE NEGATIVE Final    Comment: (NOTE) SARS-CoV-2 target nucleic acids are NOT DETECTED.  The SARS-CoV-2 RNA is generally detectable in upper and lower respiratory specimens during the acute  phase of infection. The lowest concentration of SARS-CoV-2 viral copies this assay can detect is 250 copies / mL. A negative result does not preclude SARS-CoV-2 infection and should not be used as the sole basis for treatment or other patient management decisions.  A negative result may occur with improper specimen collection / handling, submission of specimen other than nasopharyngeal swab, presence of viral mutation(s) within the areas targeted by this assay, and inadequate number of viral copies (<250 copies / mL). A negative result must be combined with clinical observations, patient history, and epidemiological information.  Fact Sheet for Patients:   StrictlyIdeas.no  Fact Sheet for Healthcare  Providers: BankingDealers.co.za  This test is not yet approved or  cleared by the [place] FDA and has been authorized for detection and/or diagnosis of SARS-CoV-2 by FDA under an Emergency Use Authorization (EUA).  This EUA will remain in effect (meaning this test can be used) for the duration of the COVID-19 declaration under Section 564(b)(1) of the Act, 21 U.S.C. section 360bbb-3(b)(1), unless the authorization is terminated or revoked sooner.  Performed at [facility], [place] [address] [place], [place] [address]   Culture, blood (routine x 2)     Status: None   Collection Time: 04/14/20 11:31 AM   Specimen: BLOOD RIGHT HAND  Result Value Ref Range Status   Specimen Description BLOOD RIGHT HAND  Final   Special Requests   Final    BOTTLES DRAWN AEROBIC ONLY Blood Culture results may not be optimal due to an inadequate volume of blood received in culture bottles   Culture   Final    NO GROWTH 5 DAYS Performed at [facility], [place] [address], [place], [place] [address]    Report Status 04/19/2020 FINAL  Final  Culture, blood (routine x 2)     Status: None   Collection Time: 04/14/20 11:36 AM   Specimen: BLOOD RIGHT HAND  Result Value Ref Range Status   Specimen Description BLOOD RIGHT HAND  Final   Special Requests   Final    BOTTLES DRAWN AEROBIC ONLY Blood Culture results may not be optimal due to an inadequate volume of blood received in culture bottles   Culture   Final    NO GROWTH 5 DAYS Performed at [facility], [place] [address], [place], [place] [address]    Report Status 04/19/2020 FINAL  Final  CSF culture     Status: None   Collection Time: 04/14/20  6:27 PM   Specimen: PATH Cytology CSF; Cerebrospinal Fluid  Result Value Ref Range Status   Specimen Description CSF  Final   Special Requests NONE  Final   Gram Stain NO WBC SEEN NO ORGANISMS SEEN CYTOSPIN SMEAR   Final   Culture   Final    NO GROWTH 3  DAYS Performed at [facility], [address] [place],  [address]    Report Status 04/18/2020 FINAL  Final  Fungus Stain     Status: None   Collection Time: 04/14/20  6:27 PM   Specimen: PATH Cytology CSF; Cerebrospinal Fluid  Result Value Ref Range Status   FUNGUS STAIN Final report  Final    Comment: (NOTE) Performed At: [facility] [address] [place], [place] [id number] Rush Farmer MD [id number]    Fungal Source CSF  Final    Comment: Performed at [facility], [place] [address], [place], [place] [address]  Fungal Stain reflex  Status: None   Collection Time: 04/14/20  6:27 PM  Result Value Ref Range Status   Fungal stain result 1 Comment  Final    Comment: (NOTE) KOH/Calcofluor preparation:  no fungus observed. Performed At: [facility] [place], [place] [id number] Rush Farmer MD [id number]       Radiology Studies: No results found.   Scheduled Meds: . amLODipine  5 mg Oral Daily  . carvedilol  3.125 mg Oral BID WC  . Chlorhexidine Gluconate Cloth  6 each Topical Q0600  . clopidogrel  75 mg Oral Daily  . darbepoetin (ARANESP) injection - DIALYSIS  40 mcg Intravenous Q Wed-HD  . dolutegravir  50 mg Oral Daily  . feeding supplement (NEPRO CARB STEADY)  237 mL Oral TID BM  . heparin injection (subcutaneous)  5,000 Units Subcutaneous Q8H  . insulin aspart  0-9 Units Subcutaneous Q4H  . lamiVUDine  300 mg Oral Daily  . levETIRAcetam  500 mg Oral Q12H  . lidocaine-EPINEPHrine  20 mL Intradermal Once  . LORazepam  2 mg Intravenous Once  . pantoprazole  40 mg Oral Daily  . pravastatin  80 mg Oral QHS  . sulfamethoxazole-trimethoprim  1 tablet Oral Once per day on Mon Wed Fri  . tamsulosin  0.4 mg Oral Daily   Continuous Infusions: . ferric gluconate (FERRLECIT/NULECIT) IV 125 mg (04/18/20 2323)  . levETIRAcetam 500 mg (04/19/20 1059)     LOS: 10 days   Time Spent in minutes   30  minutes  Little Ishikawa D.O. on 04/20/2020 at 7:22 AM  After 7pm go to [email or web address]

## 2020-04-21 LAB — RENAL FUNCTION PANEL
Albumin: 2.5 g/dL — ABNORMAL LOW (ref 3.5–5.0)
Anion gap: 12 (ref 5–15)
BUN: 31 mg/dL — ABNORMAL HIGH (ref 8–23)
CO2: 25 mmol/L (ref 22–32)
Calcium: 8.3 mg/dL — ABNORMAL LOW (ref 8.9–10.3)
Chloride: 100 mmol/L (ref 98–111)
Creatinine, Ser: 5.63 mg/dL — ABNORMAL HIGH (ref 0.61–1.24)
GFR calc Af Amer: 12 mL/min — ABNORMAL LOW (ref >60)
GFR calc non Af Amer: 10 mL/min — ABNORMAL LOW (ref >60)
Glucose, Bld: 139 mg/dL — ABNORMAL HIGH (ref 70–99)
Phosphorus: 2.7 mg/dL (ref 2.5–4.6)
Potassium: 4.3 mmol/L (ref 3.5–5.1)
Sodium: 137 mmol/L (ref 135–145)

## 2020-04-21 LAB — GLUCOSE, CAPILLARY
Glucose-Capillary: 128 mg/dL — ABNORMAL HIGH (ref 70–99)
Glucose-Capillary: 72 mg/dL (ref 70–99)
Glucose-Capillary: 132 mg/dL — ABNORMAL HIGH (ref 70–99)
Glucose-Capillary: 165 mg/dL — ABNORMAL HIGH (ref 70–99)
Glucose-Capillary: 138 mg/dL — ABNORMAL HIGH (ref 70–99)

## 2020-04-21 LAB — COMPREHENSIVE METABOLIC PANEL
ALT: 16 U/L (ref 0–44)
AST: 27 U/L (ref 15–41)
Albumin: 2.4 g/dL — ABNORMAL LOW (ref 3.5–5.0)
Alkaline Phosphatase: 76 U/L (ref 38–126)
Anion gap: 10 (ref 5–15)
BUN: 20 mg/dL (ref 8–23)
CO2: 28 mmol/L (ref 22–32)
Calcium: 8.3 mg/dL — ABNORMAL LOW (ref 8.9–10.3)
Chloride: 100 mmol/L (ref 98–111)
Creatinine, Ser: 4.36 mg/dL — ABNORMAL HIGH (ref 0.61–1.24)
GFR calc Af Amer: 16 mL/min — ABNORMAL LOW (ref >60)
GFR calc non Af Amer: 14 mL/min — ABNORMAL LOW (ref >60)
Glucose, Bld: 126 mg/dL — ABNORMAL HIGH (ref 70–99)
Potassium: 3.8 mmol/L (ref 3.5–5.1)
Sodium: 138 mmol/L (ref 135–145)
Total Bilirubin: 0.5 mg/dL (ref 0.3–1.2)
Total Protein: 6.2 g/dL — ABNORMAL LOW (ref 6.5–8.1)

## 2020-04-21 LAB — CBC
HCT: 28.5 % — ABNORMAL LOW (ref 39.0–52.0)
HCT: 29 % — ABNORMAL LOW (ref 39.0–52.0)
Hemoglobin: 9 g/dL — ABNORMAL LOW (ref 13.0–17.0)
Hemoglobin: 9.2 g/dL — ABNORMAL LOW (ref 13.0–17.0)
MCH: 29 pg (ref 26.0–34.0)
MCH: 29.2 pg (ref 26.0–34.0)
MCHC: 31.6 g/dL (ref 30.0–36.0)
MCHC: 31.7 g/dL (ref 30.0–36.0)
MCV: 91.9 fL (ref 80.0–100.0)
MCV: 92.1 fL (ref 80.0–100.0)
Platelets: 139 10*3/uL — ABNORMAL LOW (ref 150–400)
Platelets: 146 10*3/uL — ABNORMAL LOW (ref 150–400)
RBC: 3.1 MIL/uL — ABNORMAL LOW (ref 4.22–5.81)
RBC: 3.15 MIL/uL — ABNORMAL LOW (ref 4.22–5.81)
RDW: 13.4 % (ref 11.5–15.5)
RDW: 13.4 % (ref 11.5–15.5)
WBC: 5.2 10*3/uL (ref 4.0–10.5)
WBC: 6 10*3/uL (ref 4.0–10.5)
nRBC: 0 % (ref 0.0–0.2)
nRBC: 0 % (ref 0.0–0.2)

## 2020-04-21 LAB — ACID FAST SMEAR (AFB, MYCOBACTERIA): Acid Fast Smear: NEGATIVE

## 2020-04-21 MED ORDER — SODIUM CHLORIDE 0.9 % IV SOLN: 100.0000 mL | INTRAVENOUS | Status: DC | PRN

## 2020-04-21 MED ORDER — PENTAFLUOROPROP-TETRAFLUOROETH EX AERO: 1.0000 "application " | INHALATION_SPRAY | CUTANEOUS | Status: DC | PRN

## 2020-04-21 MED ORDER — ALTEPLASE 2 MG IJ SOLR: 2.0000 mg | Freq: Once | INTRAMUSCULAR | Status: DC | PRN

## 2020-04-21 MED ORDER — HEPARIN SODIUM (PORCINE) 1000 UNIT/ML DIALYSIS: 20.0000 [IU]/kg | INTRAMUSCULAR | Status: DC | PRN

## 2020-04-21 MED ORDER — HEPARIN SODIUM (PORCINE) 1000 UNIT/ML DIALYSIS: 1000.0000 [IU] | INTRAMUSCULAR | Status: DC | PRN

## 2020-04-21 MED ORDER — LIDOCAINE-PRILOCAINE 2.5-2.5 % EX CREA
1.0000 "application " | TOPICAL_CREAM | CUTANEOUS | Status: DC | PRN
  Filled 2020-04-21: qty 5

## 2020-04-21 MED ORDER — LIDOCAINE HCL (PF) 1 % IJ SOLN: 5.0000 mL | INTRAMUSCULAR | Status: DC | PRN

## 2020-04-21 MED ORDER — AMLODIPINE BESYLATE 10 MG PO TABS
10.0000 mg | ORAL_TABLET | Freq: Every day | ORAL | Status: DC
  Administered 2020-04-23 – 2020-05-07 (×12): 10 mg via ORAL
  Filled 2020-04-21 (×14): qty 1

## 2020-04-21 NOTE — Progress Notes (Signed)
Admit: 04/10/2020 LOS: 11  17M new ESRD, seizures, HIV/AIDS  Subjective:   NAEON,   HD yesterday 1.3L UF  No c/o   07/30 0701 - 07/31 0700 In: -  Out: 1750 [Urine:450]  Filed Weights   04/20/20 0923 04/20/20 1300 04/21/20 0311  Weight: 66.1 kg 64.9 kg 65 kg    Scheduled Meds:  amLODipine  5 mg Oral Daily   carvedilol  3.125 mg Oral BID WC   Chlorhexidine Gluconate Cloth  6 each Topical Q0600   clopidogrel  75 mg Oral Daily   darbepoetin (ARANESP) injection - DIALYSIS  40 mcg Intravenous Q Wed-HD   dolutegravir  50 mg Oral Daily   feeding supplement (NEPRO CARB STEADY)  237 mL Oral TID BM   heparin injection (subcutaneous)  5,000 Units Subcutaneous Q8H   insulin aspart  0-9 Units Subcutaneous TID WC   lamiVUDine  300 mg Oral Daily   levETIRAcetam  500 mg Oral Q12H   lidocaine-EPINEPHrine  20 mL Intradermal Once   LORazepam  2 mg Intravenous Once   pantoprazole  40 mg Oral Daily   pravastatin  80 mg Oral QHS   sulfamethoxazole-trimethoprim  1 tablet Oral Once per day on Mon Wed Fri   tamsulosin  0.4 mg Oral Daily   Continuous Infusions:  ferric gluconate (FERRLECIT/NULECIT) IV Stopped (04/20/20 1419)   PRN Meds:.hydrALAZINE, labetalol  Current Labs: reviewed  Results for Richard [name], Richard [name] (MRN [id number]) as of 04/18/2020 12:10  Ref. Range 04/13/2020 11:13  Saturation Ratios Latest Ref Range: 17.9 - 39.5 % 9 (L)  Ferritin Latest Ref Range: 24 - 336 ng/mL 1,130 (H)    Physical Exam:  Blood pressure (!) 163/86, pulse 83, temperature 97.6 F (36.4 C), temperature source Oral, resp. rate 16, weight 65 kg, SpO2 100 %. Chronically ill appearing NAD RRR AAO to self, 2012 CTAB S/nt/nd No sig LEE IJ TDC C/D/I   A 1. New ESRD,  1. using TDC, not surgically stable for AVF/G; VVS following 2. MWF Schedule: 3.5h, 400/600, tight hep, 3K 2-3L UF 3. CLIP in process, sig social barriers, will need SNF 2. Seizure disorder 3. HTN/Vol, BPs up, inc  CCB today 4. CKD-BMD Ca and P ok, no binders 5. Anemia, on ESA, Fe 6. HIV/AIDs, RCID following; CD4 39  P  HD on schedule  Cotn ESA and Fe  Medication Issues; o Preferred narcotic agents for pain control are hydromorphone, fentanyl, and methadone. Morphine should not be used.  o Baclofen should be avoided o Avoid oral sodium phosphate and magnesium citrate based laxatives / bowel preps    [name] MD 04/21/2020, 10:46 AM  Recent Labs  Lab 04/16/20 0740 04/16/20 0740 04/18/20 0343 04/20/20 0958 04/21/20 0258  NA 139   < > 137 137 138  K 3.5   < > 3.6 3.4* 3.8  CL 103   < > 100 99 100  CO2 25   < > 26 27 28   GLUCOSE 106*   < > 132* 120* 126*  BUN 48*   < > 55* 38* 20  CREATININE 6.56*   < > 5.79* 5.77* 4.36*  CALCIUM 7.7*   < > 7.9* 8.4* 8.3*  PHOS 3.5  --  2.8 3.9  --    < > = values in this interval not displayed.   Recent Labs  Lab 04/14/20 1137 04/16/20 0740 04/18/20 0343 04/20/20 0958 04/21/20 0258  WBC 4.9   < > 4.6 5.6 5.2  NEUTROABS 3.6  --   --   --   --  HGB 9.2*   < > 8.9* 8.9* 9.0*  HCT 27.5*   < > 27.2* 27.9* 28.5*  MCV 87.0   < > 90.4 91.5 91.9  PLT 180   < > 125* 174 139*   < > = values in this interval not displayed.

## 2020-04-21 NOTE — Progress Notes (Signed)
PROGRESS NOTE    Richard [name]  ZYS:[id number] DOB: 1955-12-22 DOA: 04/10/2020 PCP: [name], MD   Brief Narrative:  Richard [name]  is a 64 y.o. male, with medical history significant ofdiabetes, osteoarthritis, hypertension,CKD 5,neuropathies, previous CVA with residual right-sided weakness, migraine headaches , patient with extremely poor compliance, he has refused hemodialysis access in the past, presents to ED via EMS status post seizures, he is known history of seizures, but has not been noncompliant with his seizure medications, seizures was witnessed by his roommate, seizures lasted for 4 minutes, she will patient's extremely encephalopathic, unable to provide any history, but upon my evaluation is more appropriate, patient report actually he never got his seizure medications, was not taking it, patient denies any fever, chest pain, shortness of breath, no dysuria or polyuria, reports usually ambulates with a walker, he has right-sided weakness at baseline.  Interim history: Admitted for AMS, found to have CVA and seizure. Neuro was consulted. Currently on AED. Vascular consulted for access placement as patient recently started HD for ESRD. Nephro continues to follow for dialysis.  NG tube removed on the 28th, tolerating diet poorly at this time - continue to attempt to advance diet, once tolerating p.o. adequately will discharge to pain pending placement.   Assessment & Plan  Principal Problem:   Encephalopathy Active Problems:   Tobacco abuse   Malnutrition of moderate degree (HCC)   Hyperlipidemia   Neuropathy in diabetes (HCC)   GERD (gastroesophageal reflux disease)   Diabetes mellitus, type II, insulin dependent (HCC)   Altered mental status   CKD (chronic kidney disease) stage 5, GFR less than 15 ml/min (HCC)   History of cerebrovascular accident (CVA) with residual deficit   Seizure ([place])   AIDS (acquired immune deficiency syndrome) (HCC)   ESRD (end  stage renal disease) ([place])   Creatinine elevation   Goals of care, counseling/discussion   Hypertensive urgency   Meningoencephalitis   Palliative care by specialist   Acute metabolic encephalopathy, multifactorial, POA, improving slowly - Likely multifactorial including seizure, hypertension, acute stroke, uremia - Patient continues to be somewhat somnolent but markedly improving from previous documentation  Acute CVA, POA, resolving - Also with history of CVA with residual right-sided weakness and chronic right upper extremity contracture - Initial CT scan on admission no acute findings - Due to acute mental status change on the morning of July 22, code stroke initiated, stat CT scan no acute findings, stat MRI showed "2 mm acute or early subacute infarct within the left parietal lobe periatrial white matter" - HIV screening positive - Status post Lumbar puncture on 7/24, CSF Gram stain negative. Cryptococcus PCR negative. VDRL CSF is pending. - PT, OT recommending SNF - Core track removed, now tolerating p.o. per speech continue to advance diet - Neurology consulted and appreciate insight and recommendations - Continue Plavix, statin  Failure to thrive, moderate caloric malnutrition Also care -Lengthy discussion with family today over the phone about patient's poor p.o. intake -Family indicates patient is very picky about diet and foods -we recommended that family bring the patient food over the weekend in hopes that he will increase his p.o. intake appropriately. -Unclear at this point if patient is not taking adequate because he is unhappy with his diet here or simply is physically unable to tolerate enough food to maintain caloric intake -We discussed that he may require NG tube or PEG tube placement if he continues to have poor p.o. intake - or we will have  to discuss palliative care or hospice care as without adequate calories he will continue to worsen weekend and ultimately  decline in status.   Recurrent seizures in the setting of noncomlpiance, POA - He is known to be noncompliant with his seizure medication - EEG July 24 "This study is suggestive of moderate diffuse encephalopathy, nonspecific etiology.No seizures or epileptiform discharges were seen throughout the recording" - Status post Lumbar puncture on 7/24, CSF Gram stain negative. Cryptococcus PCR negative. VDRL CSF is pending. - Previously on IV Keppra and IV Vimpat , now on iv keppra only per neurology recommendation - Appreciate neurology recommendation  Hypertensive urgency, present on admission - Resolved - Currently well controlled on Coreg  Chronic kidney disease, stage V now progressing to ESRD  - Creatinine in 10-11 range, creatinine appears to be at baseline. - Patient has refused dialysis in the past, agreeable to dialysis on presentation - Nephrology consulted and appreciated - IR placed dialysis catheter, tolerating well - Currently tolerating hemodialysis well - started on 04/13/2020 - Vascular surgery consulted and appreciated for placement of access  Anemia of chronic disease - Hemoglobin stable, continue to monitor CBC  HIV/AIDS, newly diagnosed  - CD4 count 39, viral load 624,000 - New diagnosis - Infectious disease consulted and appreciated-continue tivicay, epivir - Currently on bactrim  Dark skin lesions - Nontender, does not itch - Molluscum contagiosum ? Kaposi sarcoma? - Surgical biopsy performed seven 3021 x 2, awaiting path and culture  Diabetes mellitus, type II with episode of hypoglycemia -Controlled, hemoglobin A1c 5.2 (02/14/2020) - On Novolin 70/30 at home - Had hypoglycemia requiring D5 infusion briefly on 7/23,  - Continue insulin sliding scale with CBG monitoring  Hyperlipidemia - Total cholesterol 147, HDL 31, LDL 91, triglycerides 125 - Continue statin  Tobacco use - Cessation discussed by previous physician   History of severe  noncompliance - Per previous documentation, patient counseled  DVT Prophylaxis  Heparin Code Status: Full Family Communication: None at bedside Disposition Plan:  Status is: Inpatient  Remains inpatient appropriate because:Altered mental status, IV treatments appropriate due to intensity of illness or inability to take PO and Inpatient level of care appropriate due to severity of illness   Dispo: The patient is from: Home              Anticipated d/c is to: SNF              Anticipated d/c date is: > 3 days              Patient currently is not medically stable to d/c.   Consultants Nephrology Neurology Advanced radiology Infectious disease Palliative care General surgery  Procedures  Bedside lumbar puncture attempted July 24 by neurology Lumbar puncture by IR on July 24 Dialysis catheter placement by interventional radiology   Antibiotics   Anti-infectives (From admission, onward)   Start     Dose/Rate Route Frequency Ordered Stop   04/19/20 1000  lamiVUDine (EPIVIR) tablet 300 mg     Discontinue    Note to Pharmacy: I DO NOT renally dose lamivudine and I have never had problems NOT adjusting it. I want pt to be on Dovato but still not on formulary here (unfortunately)   300 mg Oral Daily 04/18/20 1043     04/17/20 0000  Dolutegravir-lamiVUDine (DOVATO) 50-300 MG TABS     Discontinue     1 tablet Oral Daily 04/17/20 1546     04/16/20 1000  lamiVUDine (EPIVIR) tablet 300 mg  Status:  Discontinued       Note to Pharmacy: I DO NOT renally dose lamivudine and I have never had problems NOT adjusting it. I want pt to be on Dovato but still not on formulary here (unfortunately)   300 mg Per Tube Daily 04/15/20 1307 04/18/20 1043   04/16/20 0900  sulfamethoxazole-trimethoprim (BACTRIM DS) 800-160 MG per tablet 1 tablet     Discontinue     1 tablet Oral Once per day on Mon Wed Fri 04/15/20 1350     04/15/20 1245  dolutegravir (TIVICAY) tablet 50 mg     Discontinue     50 mg Oral  Daily 04/15/20 1202     04/15/20 1245  lamiVUDine (EPIVIR) tablet 300 mg  Status:  Discontinued       Note to Pharmacy: I DO NOT renally dose lamivudine and I have never had problems NOT adjusting it. I want pt to be on Dovato but still not on formulary here (unfortunately)   300 mg Oral Daily 04/15/20 1202 04/15/20 1307   04/13/20 1737  ceFAZolin (ANCEF) 2-4 GM/100ML-% IVPB       Note to Pharmacy: Desiree Hane   : cabinet override      04/13/20 1737 04/14/20 0544   04/13/20 0000  ceFAZolin (ANCEF) IVPB 2g/100 mL premix        2 g 200 mL/hr over 30 Minutes Intravenous To Radiology 04/12/20 1454 04/13/20 0834      Subjective:   No acute events or issues overnight, more awake and alert this morning, indicates that he is hungry which we encouraged given his poor p.o. intake over the past few days.  Otherwise denies chest pain shortness of breath nausea vomiting or diarrhea.  Objective:   Vitals:   04/20/20 1649 04/20/20 1921 04/20/20 2314 04/21/20 0311  BP: (!) 142/89 (!) 156/86 (!) 129/89 (!) 143/74  Pulse: 90 80 89 82  Resp: 16 18 14 16   Temp: 98.1 F (36.7 C) 98 F (36.7 C) 98.1 F (36.7 C) 98 F (36.7 C)  TempSrc: Axillary Axillary Oral Oral  SpO2: 100% 99% 98% 99%  Weight:    65 kg    Intake/Output Summary (Last 24 hours) at 04/21/2020 0725 Last data filed at 04/21/2020 4403 Gross per 24 hour  Intake --  Output 1750 ml  Net -1750 ml   Filed Weights   04/20/20 0923 04/20/20 1300 04/21/20 0311  Weight: 66.1 kg 64.9 kg 65 kg   Exam  General: Well developed, chronically ill appearing, NAD  HEENT: NCAT,  mucous membranes moist  Cardiovascular: S1 S2 auscultated, RRR.  Respiratory: Clear to auscultation bilaterally   Abdomen: Soft, nontender, nondistended, + bowel sounds  Extremities: warm dry without cyanosis clubbing or edema, RUE contracture  Neuro: Somnolent, sluggish to answer questions but does so appropriately after given adequate time  Skin: Diffuse  rash  Data Reviewed: I have personally reviewed following labs and imaging studies  CBC: Recent Labs  Lab 04/14/20 1137 04/16/20 0740 04/18/20 0343 04/20/20 0958  WBC 4.9 4.4 4.6 5.6  NEUTROABS 3.6  --   --   --   HGB 9.2* 8.4* 8.9* 8.9*  HCT 27.5* 26.4* 27.2* 27.9*  MCV 87.0 91.0 90.4 91.5  PLT 180 155 125* 474   Basic Metabolic Panel: Recent Labs  Lab 04/14/20 1137 04/14/20 1944 04/15/20 0452 04/16/20 0740 04/18/20 0343 04/20/20 0958  NA 143  --   --  139 137 137  K 3.8  --   --  3.5 3.6 3.4*  CL 110  --   --  103 100 99  CO2 21*  --   --  25 26 27   GLUCOSE 107*  --   --  106* 132* 120*  BUN 55*  --   --  48* 55* 38*  CREATININE 7.74*  --   --  6.56* 5.79* 5.77*  CALCIUM 8.0*  --   --  7.7* 7.9* 8.4*  MG  --  1.6* 2.2  --   --   --   PHOS  --  2.5 3.4 3.5 2.8 3.9   GFR: CrCl cannot be calculated (Unknown ideal weight.). Liver Function Tests: Recent Labs  Lab 04/14/20 1137 04/16/20 0740 04/18/20 0343 04/20/20 0958  AST 19  --   --   --   ALT 8  --   --   --   ALKPHOS 79  --   --   --   BILITOT 0.4  --   --   --   PROT 6.1*  --   --   --   ALBUMIN 2.3* 2.1* 2.2* 2.5*   No results for input(s): LIPASE, AMYLASE in the last 168 hours. No results for input(s): AMMONIA in the last 168 hours. Coagulation Profile: No results for input(s): INR, PROTIME in the last 168 hours. Cardiac Enzymes: No results for input(s): CKTOTAL, CKMB, CKMBINDEX, TROPONINI in the last 168 hours. BNP (last 3 results) Recent Labs    06/22/19 1505  PROBNP 170.0*   HbA1C: No results for input(s): HGBA1C in the last 72 hours. CBG: Recent Labs  Lab 04/20/20 1544 04/20/20 1917 04/20/20 2313 04/21/20 0310 04/21/20 0724  GLUCAP 101* 89 160* 128* 72   Lipid Profile: No results for input(s): CHOL, HDL, LDLCALC, TRIG, CHOLHDL, LDLDIRECT in the last 72 hours. Thyroid Function Tests: No results for input(s): TSH, T4TOTAL, FREET4, T3FREE, THYROIDAB in the last 72 hours. Anemia  Panel: No results for input(s): VITAMINB12, FOLATE, FERRITIN, TIBC, IRON, RETICCTPCT in the last 72 hours. Urine analysis:    Component Value Date/Time   COLORURINE STRAW (A) 04/11/2020 0223   APPEARANCEUR CLEAR 04/11/2020 0223   LABSPEC 1.011 04/11/2020 0223   PHURINE 5.0 04/11/2020 0223   GLUCOSEU 50 (A) 04/11/2020 0223   HGBUR SMALL (A) 04/11/2020 0223   BILIRUBINUR NEGATIVE 04/11/2020 0223   KETONESUR NEGATIVE 04/11/2020 0223   PROTEINUR >=300 (A) 04/11/2020 0223   UROBILINOGEN 0.2 11/29/2014 2003   NITRITE NEGATIVE 04/11/2020 0223   LEUKOCYTESUR NEGATIVE 04/11/2020 0223    Recent Results (from the past 240 hour(s))  Culture, blood (routine x 2)     Status: None   Collection Time: 04/14/20 11:31 AM   Specimen: BLOOD RIGHT HAND  Result Value Ref Range Status   Specimen Description BLOOD RIGHT HAND  Final   Special Requests   Final    BOTTLES DRAWN AEROBIC ONLY Blood Culture results may not be optimal due to an inadequate volume of blood received in culture bottles   Culture   Final    NO GROWTH 5 DAYS Performed at [facility], [place] [address], [place], [place] [address]    Report Status 04/19/2020 FINAL  Final  Culture, blood (routine x 2)     Status: None   Collection Time: 04/14/20 11:36 AM   Specimen: BLOOD RIGHT HAND  Result Value Ref Range Status   Specimen Description BLOOD RIGHT HAND  Final   Special Requests   Final    BOTTLES DRAWN AEROBIC  ONLY Blood Culture results may not be optimal due to an inadequate volume of blood received in culture bottles   Culture   Final    NO GROWTH 5 DAYS Performed at [facility] [address], [place], [place] [address]    Report Status 04/19/2020 FINAL  Final  CSF culture     Status: None   Collection Time: 04/14/20  6:27 PM   Specimen: PATH Cytology CSF; Cerebrospinal Fluid  Result Value Ref Range Status   Specimen Description CSF  Final   Special Requests NONE  Final   Gram Stain NO WBC SEEN NO ORGANISMS  SEEN CYTOSPIN SMEAR   Final   Culture   Final    NO GROWTH 3 DAYS Performed at [facility], [address], [place], [place] [address]    Report Status 04/18/2020 FINAL  Final  Fungus Stain     Status: None   Collection Time: 04/14/20  6:27 PM   Specimen: PATH Cytology CSF; Cerebrospinal Fluid  Result Value Ref Range Status   FUNGUS STAIN Final report  Final    Comment: (NOTE) Performed At: [facility] [address] [place], [place] [id number] Rush Farmer MD [id number]    Fungal Source CSF  Final    Comment: Performed at [facility], [place] [address], [place], [place] [address]  Fungal Stain reflex     Status: None   Collection Time: 04/14/20  6:27 PM  Result Value Ref Range Status   Fungal stain result 1 Comment  Final    Comment: (NOTE) KOH/Calcofluor preparation:  no fungus observed. Performed At: [facility] [place], [place] [id number] Rush Farmer MD YI:[id number]       Radiology Studies: No results found.   Scheduled Meds: . amLODipine  5 mg Oral Daily  . carvedilol  3.125 mg Oral BID WC  . Chlorhexidine Gluconate Cloth  6 each Topical Q0600  . clopidogrel  75 mg Oral Daily  . darbepoetin (ARANESP) injection - DIALYSIS  40 mcg Intravenous Q Wed-HD  . dolutegravir  50 mg Oral Daily  . feeding supplement (NEPRO CARB STEADY)  237 mL Oral TID BM  . heparin injection (subcutaneous)  5,000 Units Subcutaneous Q8H  . insulin aspart  0-9 Units Subcutaneous TID WC  . lamiVUDine  300 mg Oral Daily  . levETIRAcetam  500 mg Oral Q12H  . lidocaine-EPINEPHrine  20 mL Intradermal Once  . LORazepam  2 mg Intravenous Once  . pantoprazole  40 mg Oral Daily  . pravastatin  80 mg Oral QHS  . sulfamethoxazole-trimethoprim  1 tablet Oral Once per day on Mon Wed Fri  . tamsulosin  0.4 mg Oral Daily   Continuous Infusions: . ferric gluconate (FERRLECIT/NULECIT) IV Stopped (04/20/20 1419)     LOS: 11 days   Time Spent in  minutes   30 minutes  Little Ishikawa D.O. on 04/21/2020 at 7:25 AM  After 7pm go to [email or web address]

## 2020-04-21 NOTE — Progress Notes (Addendum)
   04/20/20 0832  Assess: MEWS Score  Temp 97.6 F (36.4 C)  BP (!) 187/97  Pulse Rate 94  ECG Heart Rate 95  Resp 22  Level of Consciousness Alert  SpO2 100 %  O2 Device Room Air  Assess: MEWS Score  MEWS Temp 0  MEWS Systolic 0  MEWS Pulse 0  MEWS RR 1  MEWS LOC 0  MEWS Score 1  MEWS Score Color Green  Pt assessed prior to transport to dialysis.

## 2020-04-21 NOTE — Progress Notes (Deleted)
CRITICAL VALUE ALERT  Critical Value:  INR 4.8  Date & Time Notied:  04/21/20 0747  Provider Notified: Dr. Avon Gully, MD 04/21/20 [phone]  Orders Received/Actions taken: Provider made aware.

## 2020-04-21 NOTE — Progress Notes (Addendum)
   04/20/20 1351  Assess: MEWS Score  Temp 97.8 F (36.6 C)  BP (!) 130/81  Pulse Rate 87  ECG Heart Rate 92  Resp 19  Level of Consciousness Alert  SpO2 98 %  O2 Device Room Air  Assess: MEWS Score  MEWS Temp 0  MEWS Systolic 0  MEWS Pulse 0  MEWS RR 0  MEWS LOC 0  MEWS Score 0  MEWS Score Color Green  Treat  Pain Scale 0-10  Pain Score 0  Pt assessed upon return to the unit from dialysis.

## 2020-04-22 LAB — CBC
HCT: 26.4 % — ABNORMAL LOW (ref 39.0–52.0)
Hemoglobin: 8.7 g/dL — ABNORMAL LOW (ref 13.0–17.0)
MCH: 29.2 pg (ref 26.0–34.0)
MCHC: 33 g/dL (ref 30.0–36.0)
MCV: 88.6 fL (ref 80.0–100.0)
Platelets: 131 10*3/uL — ABNORMAL LOW (ref 150–400)
RBC: 2.98 MIL/uL — ABNORMAL LOW (ref 4.22–5.81)
RDW: 13.3 % (ref 11.5–15.5)
WBC: 6.7 10*3/uL (ref 4.0–10.5)
nRBC: 0.9 % — ABNORMAL HIGH (ref 0.0–0.2)

## 2020-04-22 LAB — COMPREHENSIVE METABOLIC PANEL
ALT: 15 U/L (ref 0–44)
AST: 23 U/L (ref 15–41)
Albumin: 2.5 g/dL — ABNORMAL LOW (ref 3.5–5.0)
Alkaline Phosphatase: 82 U/L (ref 38–126)
Anion gap: 13 (ref 5–15)
BUN: 36 mg/dL — ABNORMAL HIGH (ref 8–23)
CO2: 23 mmol/L (ref 22–32)
Calcium: 8.3 mg/dL — ABNORMAL LOW (ref 8.9–10.3)
Chloride: 100 mmol/L (ref 98–111)
Creatinine, Ser: 6.2 mg/dL — ABNORMAL HIGH (ref 0.61–1.24)
GFR calc Af Amer: 10 mL/min — ABNORMAL LOW (ref >60)
GFR calc non Af Amer: 9 mL/min — ABNORMAL LOW (ref >60)
Glucose, Bld: 94 mg/dL (ref 70–99)
Potassium: 4.6 mmol/L (ref 3.5–5.1)
Sodium: 136 mmol/L (ref 135–145)
Total Bilirubin: 0.2 mg/dL — ABNORMAL LOW (ref 0.3–1.2)
Total Protein: 6 g/dL — ABNORMAL LOW (ref 6.5–8.1)

## 2020-04-22 LAB — GLUCOSE, CAPILLARY
Glucose-Capillary: 104 mg/dL — ABNORMAL HIGH (ref 70–99)
Glucose-Capillary: 107 mg/dL — ABNORMAL HIGH (ref 70–99)
Glucose-Capillary: 156 mg/dL — ABNORMAL HIGH (ref 70–99)
Glucose-Capillary: 158 mg/dL — ABNORMAL HIGH (ref 70–99)
Glucose-Capillary: 189 mg/dL — ABNORMAL HIGH (ref 70–99)
Glucose-Capillary: 79 mg/dL (ref 70–99)

## 2020-04-22 MED ORDER — LEVETIRACETAM IN NACL 500 MG/100ML IV SOLN
500.0000 mg | Freq: Once | INTRAVENOUS | Status: AC
  Administered 2020-04-22: 500 mg via INTRAVENOUS
  Filled 2020-04-22: qty 100

## 2020-04-22 NOTE — Progress Notes (Signed)
Patient is refusing medications today. Notified provider, Dr. Avon Gully, MD who was made aware with request to convert medications as possible to IV.

## 2020-04-22 NOTE — Progress Notes (Signed)
Admit: 04/10/2020 LOS: 12  63M new ESRD, seizures, HIV/AIDS  Subjective:  . NAEON,  . somnolent   07/31 0701 - 08/01 0700 In: -  Out: 200 [Urine:200]  Filed Weights   04/20/20 1300 04/21/20 0311 04/22/20 0500  Weight: 64.9 kg 65 kg 70.8 kg    Scheduled Meds: . amLODipine  10 mg Oral Daily  . carvedilol  3.125 mg Oral BID WC  . Chlorhexidine Gluconate Cloth  6 each Topical Q0600  . clopidogrel  75 mg Oral Daily  . darbepoetin (ARANESP) injection - DIALYSIS  40 mcg Intravenous Q Wed-HD  . dolutegravir  50 mg Oral Daily  . feeding supplement (NEPRO CARB STEADY)  237 mL Oral TID BM  . heparin injection (subcutaneous)  5,000 Units Subcutaneous Q8H  . insulin aspart  0-9 Units Subcutaneous TID WC  . lamiVUDine  300 mg Oral Daily  . levETIRAcetam  500 mg Oral Q12H  . lidocaine-EPINEPHrine  20 mL Intradermal Once  . LORazepam  2 mg Intravenous Once  . pantoprazole  40 mg Oral Daily  . pravastatin  80 mg Oral QHS  . sulfamethoxazole-trimethoprim  1 tablet Oral Once per day on Mon Wed Fri  . tamsulosin  0.4 mg Oral Daily   Continuous Infusions: . sodium chloride    . sodium chloride    . sodium chloride    . sodium chloride    . ferric gluconate (FERRLECIT/NULECIT) IV Stopped (04/20/20 1419)   PRN Meds:.sodium chloride, sodium chloride, sodium chloride, sodium chloride, alteplase, alteplase, heparin, heparin, heparin, hydrALAZINE, labetalol, lidocaine (PF), lidocaine-prilocaine, pentafluoroprop-tetrafluoroeth  Current Labs: reviewed  Results for Richard [name], Richard [name] (MRN [id number]) as of 04/18/2020 12:10  Ref. Range 04/13/2020 11:13  Saturation Ratios Latest Ref Range: 17.9 - 39.5 % 9 (L)  Ferritin Latest Ref Range: 24 - 336 ng/mL 1,130 (H)    Physical Exam:  Blood pressure (!) 174/82, pulse 88, temperature 98.8 F (37.1 C), temperature source Oral, resp. rate 13, weight 70.8 kg, SpO2 100 %. Chronically ill appearing NAD RRR AAO to self, 2012 CTAB S/nt/nd No sig  LEE IJ TDC C/D/I   A 1. New ESRD,  1. using TDC, not surgically stable for AVF/G; VVS following 2. MWF Schedule: 3.5h, 400/600, tight hep, 2K 2-3L UF 3. CLIP in process, sig social barriers, will need SNF 2. Seizure disorder 3. HTN/Vol, BPs up, recent inc amlodipine 4. CKD-BMD Ca and P ok, no binders 5. Anemia, on ESA, Fe 6. HIV/AIDs, RCID following; CD4 39  P . HD on schedule tomorrow . Cotn ESA and Fe . Medication Issues; o Preferred narcotic agents for pain control are hydromorphone, fentanyl, and methadone. Morphine should not be used.  o Baclofen should be avoided o Avoid oral sodium phosphate and magnesium citrate based laxatives / bowel preps    [name] MD 04/22/2020, 10:41 AM  Recent Labs  Lab 04/18/20 0343 04/18/20 0343 04/20/20 9735 04/20/20 3299 04/21/20 0258 04/21/20 2106 04/22/20 0222  NA 137   < > 137   < > 138 137 136  K 3.6   < > 3.4*   < > 3.8 4.3 4.6  CL 100   < > 99   < > 100 100 100  CO2 26   < > 27   < > 28 25 23   GLUCOSE 132*   < > 120*   < > 126* 139* 94  BUN 55*   < > 38*   < > 20 31* 36*  CREATININE 5.79*   < >  5.77*   < > 4.36* 5.63* 6.20*  CALCIUM 7.9*   < > 8.4*   < > 8.3* 8.3* 8.3*  PHOS 2.8  --  3.9  --   --  2.7  --    < > = values in this interval not displayed.   Recent Labs  Lab 04/21/20 0258 04/21/20 2106 04/22/20 0222  WBC 5.2 6.0 6.7  HGB 9.0* 9.2* 8.7*  HCT 28.5* 29.0* 26.4*  MCV 91.9 92.1 88.6  PLT 139* 146* 131*

## 2020-04-22 NOTE — Progress Notes (Signed)
PROGRESS NOTE    Richard [name]  JSH:[id number] DOB: 01-May-1956 DOA: 04/10/2020 PCP: [name], MD   Brief Narrative:  Richard [name]  is a 64 y.o. male, with medical history significant ofdiabetes, osteoarthritis, hypertension,CKD 5,neuropathies, previous CVA with residual right-sided weakness, migraine headaches , patient with extremely poor compliance, he has refused hemodialysis access in the past, presents to ED via EMS status post seizures, he is known history of seizures, but has not been noncompliant with his seizure medications, seizures was witnessed by his roommate, seizures lasted for 4 minutes, she will patient's extremely encephalopathic, unable to provide any history, but upon my evaluation is more appropriate, patient report actually he never got his seizure medications, was not taking it, patient denies any fever, chest pain, shortness of breath, no dysuria or polyuria, reports usually ambulates with a walker, he has right-sided weakness at baseline.  Interim history: Admitted for AMS, found to have CVA and seizure. Neuro was consulted. Currently on AED. Vascular consulted for access placement as patient recently started HD for ESRD. Nephro continues to follow for dialysis.  NG tube removed on the 28th, tolerating diet poorly at this time - continue to attempt to advance diet, once tolerating p.o. adequately will discharge to SNF pending placement availability and approval.   Assessment & Plan  Principal Problem:   Encephalopathy Active Problems:   Tobacco abuse   Malnutrition of moderate degree (HCC)   Hyperlipidemia   Neuropathy in diabetes ([place])   GERD (gastroesophageal reflux disease)   Diabetes mellitus, type II, insulin dependent (HCC)   Altered mental status   CKD (chronic kidney disease) stage 5, GFR less than 15 ml/min (HCC)   History of cerebrovascular accident (CVA) with residual deficit   Seizure ([place])   AIDS (acquired immune deficiency  syndrome) ([place])   ESRD (end stage renal disease) ([place])   Creatinine elevation   Goals of care, counseling/discussion   Hypertensive urgency   Meningoencephalitis   Palliative care by specialist   Acute metabolic encephalopathy, multifactorial, POA, improving slowly - Likely multifactorial including seizure, hypertension, acute stroke, uremia - Patient continues to be somewhat somnolent but markedly improving from previous documentation  Acute CVA, POA, resolving - Also with history of CVA with residual right-sided weakness and chronic right upper extremity contracture - Initial CT scan on admission no acute findings - Due to acute mental status change on the morning of July 22, code stroke initiated, stat CT scan no acute findings, stat MRI showed "2 mm acute or early subacute infarct within the left parietal lobe periatrial white matter" - HIV screening positive - Status post Lumbar puncture on 7/24, CSF Gram stain negative. Cryptococcus PCR negative. VDRL CSF is pending. - PT, OT recommending SNF - Core track removed, now tolerating p.o. per speech continue to advance diet - Neurology consulted and appreciate insight and recommendations - Continue Plavix, statin  Failure to thrive, moderate caloric malnutrition -Markedly improving over the past 24 hours, not eating majority if not all of his meals -Lengthy discussion with family today over the phone about patient's poor p.o. intake -Family indicates patient is very picky about diet and foods -we recommended that family bring the patient food over the weekend in hopes that he will increase his p.o. intake appropriately. -Unclear at this point if patient is not taking adequate because he is unhappy with his diet here or simply is physically unable to tolerate enough food to maintain caloric intake -We discussed that he may require NG tube  or PEG tube placement if he continues to have poor p.o. intake - or we will have to discuss  palliative care or hospice care as without adequate calories he will continue to worsen weekend and ultimately decline in status.   Recurrent seizures in the setting of noncomlpiance, POA - He is known to be noncompliant with his seizure medication - EEG July 24 "This study is suggestive of moderate diffuse encephalopathy, nonspecific etiology.No seizures or epileptiform discharges were seen throughout the recording" - Status post Lumbar puncture on 7/24, CSF Gram stain negative. Cryptococcus PCR negative. VDRL CSF is pending. - Previously on IV Keppra and IV Vimpat , now on iv keppra only per neurology recommendation - Appreciate neurology recommendation  Hypertensive urgency, present on admission - Resolved - Currently well controlled on Coreg  Chronic kidney disease, stage V now progressing to ESRD  - Creatinine in 10-11 range, creatinine appears to be at baseline. - Patient has refused dialysis in the past, agreeable to dialysis on presentation - Nephrology consulted and appreciated - IR placed dialysis catheter, tolerating well - Currently tolerating hemodialysis well - started on 04/13/2020 - Vascular surgery consulted and appreciated for placement of access  Anemia of chronic disease - Hemoglobin stable, continue to monitor CBC  HIV/AIDS, newly diagnosed  - CD4 count 39, viral load 624,000 - New diagnosis - Infectious disease consulted and appreciated-continue tivicay, epivir - Currently on bactrim  Dark skin lesions - Nontender, does not itch - Molluscum contagiosum ? Kaposi sarcoma? - Surgical biopsy performed seven 3021 x 2, awaiting path and culture  Diabetes mellitus, type II with episode of hypoglycemia -Controlled, hemoglobin A1c 5.2 (02/14/2020) - On Novolin 70/30 at home - Had hypoglycemia requiring D5 infusion briefly on 7/23,  - Continue insulin sliding scale with CBG monitoring  Hyperlipidemia - Total cholesterol 147, HDL 31, LDL 91, triglycerides  125 - Continue statin  Tobacco use - Cessation discussed by previous physician   History of severe noncompliance - Per previous documentation, patient counseled -Patient continues to refuse certain p.o. medications today, Keppra was given x1 IV to ensure no lapse in seizure medication thresholds.  DVT Prophylaxis  Heparin Code Status: Full Family Communication: None at bedside Status is: Inpatient  Remains inpatient appropriate because:IV treatments appropriate due to intensity of illness or inability to take PO   Dispo: The patient is from: Home              Anticipated d/c is to: SNF              Anticipated d/c date is: 1 day              Patient currently is medically stable to d/c.   Consultants Nephrology Neurology Advanced radiology Infectious disease Palliative care General surgery  Procedures  Bedside lumbar puncture attempted July 24 by neurology Lumbar puncture by IR on July 24 Dialysis catheter placement by interventional radiology   Antibiotics   Anti-infectives (From admission, onward)   Start     Dose/Rate Route Frequency Ordered Stop   04/19/20 1000  lamiVUDine (EPIVIR) tablet 300 mg     Discontinue    Note to Pharmacy: I DO NOT renally dose lamivudine and I have never had problems NOT adjusting it. I want pt to be on Dovato but still not on formulary here (unfortunately)   300 mg Oral Daily 04/18/20 1043     04/17/20 0000  Dolutegravir-lamiVUDine (DOVATO) 50-300 MG TABS     Discontinue  1 tablet Oral Daily 04/17/20 1546     04/16/20 1000  lamiVUDine (EPIVIR) tablet 300 mg  Status:  Discontinued       Note to Pharmacy: I DO NOT renally dose lamivudine and I have never had problems NOT adjusting it. I want pt to be on Dovato but still not on formulary here (unfortunately)   300 mg Per Tube Daily 04/15/20 1307 04/18/20 1043   04/16/20 0900  sulfamethoxazole-trimethoprim (BACTRIM DS) 800-160 MG per tablet 1 tablet     Discontinue     1 tablet Oral  Once per day on Mon Wed Fri 04/15/20 1350     04/15/20 1245  dolutegravir (TIVICAY) tablet 50 mg     Discontinue     50 mg Oral Daily 04/15/20 1202     04/15/20 1245  lamiVUDine (EPIVIR) tablet 300 mg  Status:  Discontinued       Note to Pharmacy: I DO NOT renally dose lamivudine and I have never had problems NOT adjusting it. I want pt to be on Dovato but still not on formulary here (unfortunately)   300 mg Oral Daily 04/15/20 1202 04/15/20 1307   04/13/20 1737  ceFAZolin (ANCEF) 2-4 GM/100ML-% IVPB       Note to Pharmacy: Desiree Hane   : cabinet override      04/13/20 1737 04/14/20 0544   04/13/20 0000  ceFAZolin (ANCEF) IVPB 2g/100 mL premix        2 g 200 mL/hr over 30 Minutes Intravenous To Radiology 04/12/20 1454 04/13/20 0834      Subjective:   No acute events or issues overnight, more awake and alert this morning, indicates that he is hungry which we encouraged given his poor p.o. intake over the past few days.  Otherwise denies chest pain shortness of breath nausea vomiting or diarrhea.  Objective:   Vitals:   04/21/20 2037 04/22/20 0028 04/22/20 0355 04/22/20 0500  BP: (!) 141/63 121/74 (!) 138/66   Pulse: 80 88 84   Resp: 16 17 17    Temp: 98 F (36.7 C) 98.9 F (37.2 C) 98.3 F (36.8 C)   TempSrc: Oral     SpO2: 100% 100% 100%   Weight:    70.8 kg    Intake/Output Summary (Last 24 hours) at 04/22/2020 0729 Last data filed at 04/22/2020 0355 Gross per 24 hour  Intake --  Output 200 ml  Net -200 ml   Filed Weights   04/20/20 1300 04/21/20 0311 04/22/20 0500  Weight: 64.9 kg 65 kg 70.8 kg   Exam  General: Well developed, chronically ill appearing, NAD  HEENT: NCAT,  mucous membranes moist  Cardiovascular: S1 S2 auscultated, RRR.  Respiratory: Clear to auscultation bilaterally   Abdomen: Soft, nontender, nondistended, + bowel sounds  Extremities: warm dry without cyanosis clubbing or edema, RUE contracture  Neuro: Somnolent, sluggish to answer  questions but does so appropriately after given adequate time  Skin: Diffuse rash  Data Reviewed: I have personally reviewed following labs and imaging studies  CBC: Recent Labs  Lab 04/18/20 0343 04/20/20 0958 04/21/20 0258 04/21/20 2106 04/22/20 0222  WBC 4.6 5.6 5.2 6.0 6.7  HGB 8.9* 8.9* 9.0* 9.2* 8.7*  HCT 27.2* 27.9* 28.5* 29.0* 26.4*  MCV 90.4 91.5 91.9 92.1 88.6  PLT 125* 174 139* 146* 762*   Basic Metabolic Panel: Recent Labs  Lab 04/16/20 0740 04/16/20 0740 04/18/20 0343 04/20/20 0958 04/21/20 0258 04/21/20 2106 04/22/20 0222  NA 139   < > 137  137 138 137 136  K 3.5   < > 3.6 3.4* 3.8 4.3 4.6  CL 103   < > 100 99 100 100 100  CO2 25   < > 26 27 28 25 23   GLUCOSE 106*   < > 132* 120* 126* 139* 94  BUN 48*   < > 55* 38* 20 31* 36*  CREATININE 6.56*   < > 5.79* 5.77* 4.36* 5.63* 6.20*  CALCIUM 7.7*   < > 7.9* 8.4* 8.3* 8.3* 8.3*  PHOS 3.5  --  2.8 3.9  --  2.7  --    < > = values in this interval not displayed.   GFR: CrCl cannot be calculated (Unknown ideal weight.). Liver Function Tests: Recent Labs  Lab 04/18/20 0343 04/20/20 0958 04/21/20 0258 04/21/20 2106 04/22/20 0222  AST  --   --  27  --  23  ALT  --   --  16  --  15  ALKPHOS  --   --  76  --  82  BILITOT  --   --  0.5  --  0.2*  PROT  --   --  6.2*  --  6.0*  ALBUMIN 2.2* 2.5* 2.4* 2.5* 2.5*   No results for input(s): LIPASE, AMYLASE in the last 168 hours. No results for input(s): AMMONIA in the last 168 hours. Coagulation Profile: No results for input(s): INR, PROTIME in the last 168 hours. Cardiac Enzymes: No results for input(s): CKTOTAL, CKMB, CKMBINDEX, TROPONINI in the last 168 hours. BNP (last 3 results) Recent Labs    06/22/19 1505  PROBNP 170.0*   HbA1C: No results for input(s): HGBA1C in the last 72 hours. CBG: Recent Labs  Lab 04/21/20 1731 04/21/20 2038 04/22/20 0027 04/22/20 0359 04/22/20 0724  GLUCAP 165* 138* 107* 104* 189*   Lipid Profile: No results  for input(s): CHOL, HDL, LDLCALC, TRIG, CHOLHDL, LDLDIRECT in the last 72 hours. Thyroid Function Tests: No results for input(s): TSH, T4TOTAL, FREET4, T3FREE, THYROIDAB in the last 72 hours. Anemia Panel: No results for input(s): VITAMINB12, FOLATE, FERRITIN, TIBC, IRON, RETICCTPCT in the last 72 hours. Urine analysis:    Component Value Date/Time   COLORURINE STRAW (A) 04/11/2020 0223   APPEARANCEUR CLEAR 04/11/2020 0223   LABSPEC 1.011 04/11/2020 0223   PHURINE 5.0 04/11/2020 0223   GLUCOSEU 50 (A) 04/11/2020 0223   HGBUR SMALL (A) 04/11/2020 0223   BILIRUBINUR NEGATIVE 04/11/2020 0223   KETONESUR NEGATIVE 04/11/2020 0223   PROTEINUR >=300 (A) 04/11/2020 0223   UROBILINOGEN 0.2 11/29/2014 2003   NITRITE NEGATIVE 04/11/2020 0223   LEUKOCYTESUR NEGATIVE 04/11/2020 0223    Recent Results (from the past 240 hour(s))  Culture, blood (routine x 2)     Status: None   Collection Time: 04/14/20 11:31 AM   Specimen: BLOOD RIGHT HAND  Result Value Ref Range Status   Specimen Description BLOOD RIGHT HAND  Final   Special Requests   Final    BOTTLES DRAWN AEROBIC ONLY Blood Culture results may not be optimal due to an inadequate volume of blood received in culture bottles   Culture   Final    NO GROWTH 5 DAYS Performed at [facility], [place] [address] [place], [place] [address]    Report Status 04/19/2020 FINAL  Final  Culture, blood (routine x 2)     Status: None   Collection Time: 04/14/20 11:36 AM   Specimen: BLOOD RIGHT HAND  Result Value Ref Range Status  Specimen Description BLOOD RIGHT HAND  Final   Special Requests   Final    BOTTLES DRAWN AEROBIC ONLY Blood Culture results may not be optimal due to an inadequate volume of blood received in culture bottles   Culture   Final    NO GROWTH 5 DAYS Performed at [facility], [place] [address], [place], [place] [address]    Report Status 04/19/2020 FINAL  Final  CSF culture     Status: None   Collection Time:  04/14/20  6:27 PM   Specimen: PATH Cytology CSF; Cerebrospinal Fluid  Result Value Ref Range Status   Specimen Description CSF  Final   Special Requests NONE  Final   Gram Stain NO WBC SEEN NO ORGANISMS SEEN CYTOSPIN SMEAR   Final   Culture   Final    NO GROWTH 3 DAYS Performed at [facility], [address], [place], [place] [address]    Report Status 04/18/2020 FINAL  Final  Fungus Stain     Status: None   Collection Time: 04/14/20  6:27 PM   Specimen: PATH Cytology CSF; Cerebrospinal Fluid  Result Value Ref Range Status   FUNGUS STAIN Final report  Final    Comment: (NOTE) Performed At: [facility] [address] [place], [place] [id number] Rush Farmer MD OQ:[id number]    Fungal Source CSF  Final    Comment: Performed at [facility], [place] [address] [place], [place] [address]  Fungal Stain reflex     Status: None   Collection Time: 04/14/20  6:27 PM  Result Value Ref Range Status   Fungal stain result 1 Comment  Final    Comment: (NOTE) KOH/Calcofluor preparation:  no fungus observed. Performed At: [facility], [place] [id number] Rush Farmer MD FV:[id number]   Acid Fast Smear (AFB)     Status: None   Collection Time: 04/20/20  2:08 PM   Specimen: Skin Biopsy  Result Value Ref Range Status   AFB Specimen Processing Comment  Final    Comment: Tissue Grinding and Digestion/Decontamination   Acid Fast Smear Negative  Final    Comment: (NOTE) Performed At: [facility] [place], [place] [id number] Rush Farmer MD JT:[id number]    Source (AFB) SKIN  Final    Comment: BIOPSY Performed at [facility], [place] [address] [place],  [address]       Radiology Studies: No results found.   Scheduled Meds:  amLODipine  10 mg Oral Daily   carvedilol  3.125 mg Oral BID WC   Chlorhexidine Gluconate Cloth  6 each Topical Q0600   clopidogrel  75 mg Oral Daily    darbepoetin (ARANESP) injection - DIALYSIS  40 mcg Intravenous Q Wed-HD   dolutegravir  50 mg Oral Daily   feeding supplement (NEPRO CARB STEADY)  237 mL Oral TID BM   heparin injection (subcutaneous)  5,000 Units Subcutaneous Q8H   insulin aspart  0-9 Units Subcutaneous TID WC   lamiVUDine  300 mg Oral Daily   levETIRAcetam  500 mg Oral Q12H   lidocaine-EPINEPHrine  20 mL Intradermal Once   LORazepam  2 mg Intravenous Once   pantoprazole  40 mg Oral Daily   pravastatin  80 mg Oral QHS   sulfamethoxazole-trimethoprim  1 tablet Oral Once per day on Mon Wed Fri   tamsulosin  0.4 mg Oral Daily   Continuous Infusions:  sodium chloride     sodium  chloride     sodium chloride     sodium chloride     ferric gluconate (FERRLECIT/NULECIT) IV Stopped (04/20/20 1419)     LOS: 12 days   Time Spent in minutes   30 minutes  Little Ishikawa D.O. on 04/22/2020 at 7:29 AM  After 7pm go to [email or web address]

## 2020-04-23 ENCOUNTER — Ambulatory Visit: Payer: Medicare HMO

## 2020-04-23 DIAGNOSIS — E440 Moderate protein-calorie malnutrition: Secondary | ICD-10-CM

## 2020-04-23 DIAGNOSIS — R531 Weakness: Secondary | ICD-10-CM

## 2020-04-23 LAB — GLUCOSE, CAPILLARY
Glucose-Capillary: 101 mg/dL — ABNORMAL HIGH (ref 70–99)
Glucose-Capillary: 121 mg/dL — ABNORMAL HIGH (ref 70–99)
Glucose-Capillary: 193 mg/dL — ABNORMAL HIGH (ref 70–99)
Glucose-Capillary: 254 mg/dL — ABNORMAL HIGH (ref 70–99)
Glucose-Capillary: 95 mg/dL (ref 70–99)

## 2020-04-23 LAB — CBC
HCT: 26.2 % — ABNORMAL LOW (ref 39.0–52.0)
Hemoglobin: 8.6 g/dL — ABNORMAL LOW (ref 13.0–17.0)
MCH: 30.1 pg (ref 26.0–34.0)
MCHC: 32.8 g/dL (ref 30.0–36.0)
MCV: 91.6 fL (ref 80.0–100.0)
Platelets: 201 10*3/uL (ref 150–400)
RBC: 2.86 MIL/uL — ABNORMAL LOW (ref 4.22–5.81)
RDW: 13.6 % (ref 11.5–15.5)
WBC: 7 10*3/uL (ref 4.0–10.5)
nRBC: 0 % (ref 0.0–0.2)

## 2020-04-23 LAB — COMPREHENSIVE METABOLIC PANEL
ALT: 14 U/L (ref 0–44)
AST: 20 U/L (ref 15–41)
Albumin: 2.5 g/dL — ABNORMAL LOW (ref 3.5–5.0)
Alkaline Phosphatase: 81 U/L (ref 38–126)
Anion gap: 10 (ref 5–15)
BUN: 50 mg/dL — ABNORMAL HIGH (ref 8–23)
CO2: 25 mmol/L (ref 22–32)
Calcium: 8.3 mg/dL — ABNORMAL LOW (ref 8.9–10.3)
Chloride: 101 mmol/L (ref 98–111)
Creatinine, Ser: 7.8 mg/dL — ABNORMAL HIGH (ref 0.61–1.24)
GFR calc Af Amer: 8 mL/min — ABNORMAL LOW (ref >60)
GFR calc non Af Amer: 7 mL/min — ABNORMAL LOW (ref >60)
Glucose, Bld: 152 mg/dL — ABNORMAL HIGH (ref 70–99)
Potassium: 4.1 mmol/L (ref 3.5–5.1)
Sodium: 136 mmol/L (ref 135–145)
Total Bilirubin: 0.7 mg/dL (ref 0.3–1.2)
Total Protein: 5.8 g/dL — ABNORMAL LOW (ref 6.5–8.1)

## 2020-04-23 MED ORDER — DIPHENHYDRAMINE HCL 50 MG/ML IJ SOLN
25.0000 mg | Freq: Once | INTRAMUSCULAR | Status: AC
  Administered 2020-04-23: 25 mg via INTRAVENOUS
  Filled 2020-04-23: qty 1

## 2020-04-23 MED ORDER — DARBEPOETIN ALFA 150 MCG/0.3ML IJ SOSY: 150.0000 ug | PREFILLED_SYRINGE | INTRAMUSCULAR | Status: DC

## 2020-04-23 MED ORDER — SULFAMETHOXAZOLE-TRIMETHOPRIM 800-160 MG PO TABS
1.0000 | ORAL_TABLET | ORAL | 0 refills | Status: DC
Start: 2020-04-23 — End: 2022-09-06

## 2020-04-23 NOTE — Progress Notes (Signed)
[facility] Palliative Care Program:  Received referral from [facility] for [facility] to follow patient at [facility] upon discharge.Spoke with daughter Kathlee Nations to confirm, explain services and answer questions. Family has [facility] contact information and is encouraged to call for support as needed.   [facility] HLT will follow patient during this hospitalization and update our Palliative Care team upon discharge.  Thank you for this referral,  Gar Ponto, RN [facility] HLT [phone]

## 2020-04-23 NOTE — Progress Notes (Signed)
PROGRESS NOTE    Richard [name]  VPX:[id number] DOB: 1956-05-27 DOA: 04/10/2020 PCP: [name], MD   Brief Narrative:  Richard [name]  is a 64 y.o. male, with medical history significant ofdiabetes, osteoarthritis, hypertension,CKD 5,neuropathies, previous CVA with residual right-sided weakness, migraine headaches , patient with extremely poor compliance, he has refused hemodialysis access in the past, presents to ED via EMS status post seizures, he is known history of seizures, but has not been noncompliant with his seizure medications, seizures was witnessed by his roommate, seizures lasted for 4 minutes, she will patient's extremely encephalopathic, unable to provide any history, but upon my evaluation is more appropriate, patient report actually he never got his seizure medications, was not taking it, patient denies any fever, chest pain, shortness of breath, no dysuria or polyuria, reports usually ambulates with a walker, he has right-sided weakness at baseline.  Interim history: Admitted for AMS, found to have CVA and seizure. Neuro was consulted. Currently on AED. Vascular consulted for access placement as patient recently started HD for ESRD. Nephro continues to follow for dialysis.  NG tube removed on the 28th - continue to attempt to advance diet, once tolerating p.o. adequately will discharge to SNF pending placement availability and approval.   Assessment & Plan  Principal Problem:   Encephalopathy Active Problems:   Tobacco abuse   Malnutrition of moderate degree (HCC)   Hyperlipidemia   Neuropathy in diabetes ([place])   GERD (gastroesophageal reflux disease)   Diabetes mellitus, type II, insulin dependent (HCC)   Altered mental status   CKD (chronic kidney disease) stage 5, GFR less than 15 ml/min (HCC)   History of cerebrovascular accident (CVA) with residual deficit   Seizure ([place])   AIDS (acquired immune deficiency syndrome) ([place])   ESRD (end stage renal  disease) ([place])   Creatinine elevation   Goals of care, counseling/discussion   Hypertensive urgency   Meningoencephalitis   Palliative care by specialist  Acute metabolic encephalopathy, multifactorial, POA, improving slowly - Likely multifactorial including seizure, hypertension, acute stroke, uremia - Patient continues to be somewhat delayed with answering questions but does so appropriately, somnolence appears to be improving drastically over the past few days.  Much more awake alert and oriented this morning.  Acute CVA, POA, stable - Also with history of CVA with residual right-sided weakness and chronic right upper extremity contracture - Initial CT scan on admission no acute findings - Due to acute mental status change on the morning of July 22, code stroke initiated, stat CT scan no acute findings, stat MRI showed "2 mm acute or early subacute infarct within the left parietal lobe periatrial white matter" - HIV screening positive - Status post Lumbar puncture on 7/24, CSF Gram stain/culture negative. Cryptococcus PCR negative. VDRL CSF is negative. - PT, OT recommending SNF - Core track removed, now tolerating p.o. per speech continue to advance diet as tolerated - Neurology consulted and appreciate insight and recommendations - Continue Plavix, statin  Failure to thrive, moderate caloric malnutrition, improving -P.o. intake drastically improving over the past 48 hours -Nutrition/dietary continue to follow, appreciate insight and recommendations, given advancement in diet and p.o. tolerance no longer following for possible PEG tube placement, family updated.  Recurrent seizures in the setting of noncomlpiance, POA - He is known to be noncompliant with his seizure medication - EEG July 24 "This study is suggestive of moderate diffuse encephalopathy, nonspecific etiology.No seizures or epileptiform discharges were seen throughout the recording" - Status post Lumbar puncture  on  7/24, CSF Gram stain negative. Cryptococcus PCR negative. VDRL CSF is pending. - Previously on IV Keppra and IV Vimpat , now on iv keppra only per neurology recommendation - Appreciate neurology recommendation  Hypertensive urgency, present on admission - Resolved - Currently well controlled on Coreg  Chronic kidney disease, stage V now progressing to ESRD  - Creatinine in 10-11 range, creatinine appears to be at baseline. - Patient has refused dialysis in the past, agreeable to dialysis on presentation - Nephrology consulted and appreciated - IR placed dialysis catheter, tolerating well - Currently tolerating hemodialysis well - started on 04/13/2020 - Vascular surgery consulted and appreciated for placement of access  Anemia of chronic disease - Hemoglobin stable, continue to monitor CBC  HIV/AIDS, newly diagnosed  - CD4 count 39, viral load 624,000 - New diagnosis - Infectious disease consulted and appreciated-continue tivicay, epivir - Currently on bactrim  Dark skin lesions - Nontender, does not itch - Molluscum contagiosum ? Kaposi sarcoma? - Surgical biopsy performed seven 3021 x 2, awaiting path and culture  Diabetes mellitus, type II with episode of hypoglycemia -Controlled, hemoglobin A1c 5.2 (02/14/2020) - On Novolin 70/30 at home - Had hypoglycemia requiring D5 infusion briefly on 7/23,  - Continue insulin sliding scale with CBG monitoring  Hyperlipidemia - Total cholesterol 147, HDL 31, LDL 91, triglycerides 125 - Continue statin  Tobacco use - Cessation discussed by previous physician   History of severe noncompliance - Per previous documentation, patient counseled -Patient continues to refuse certain p.o. medications today, Keppra was given x1 IV to ensure no lapse in seizure medication thresholds.  DVT Prophylaxis  Heparin Code Status: Full Family Communication: None at bedside Status is: Inpatient  Remains inpatient appropriate because:IV  treatments appropriate due to intensity of illness or inability to take PO   Dispo: The patient is from: Home              Anticipated d/c is to: SNF              Anticipated d/c date is: 1 day              Patient currently is medically stable to d/c.   Consultants Nephrology Neurology Advanced radiology Infectious disease Palliative care General surgery  Procedures  Bedside lumbar puncture attempted July 24 by neurology Lumbar puncture by IR on July 24 Dialysis catheter placement by interventional radiology   Antibiotics   Anti-infectives (From admission, onward)   Start     Dose/Rate Route Frequency Ordered Stop   04/19/20 1000  lamiVUDine (EPIVIR) tablet 300 mg     Discontinue    Note to Pharmacy: I DO NOT renally dose lamivudine and I have never had problems NOT adjusting it. I want pt to be on Dovato but still not on formulary here (unfortunately)   300 mg Oral Daily 04/18/20 1043     04/17/20 0000  Dolutegravir-lamiVUDine (DOVATO) 50-300 MG TABS     Discontinue     1 tablet Oral Daily 04/17/20 1546     04/16/20 1000  lamiVUDine (EPIVIR) tablet 300 mg  Status:  Discontinued       Note to Pharmacy: I DO NOT renally dose lamivudine and I have never had problems NOT adjusting it. I want pt to be on Dovato but still not on formulary here (unfortunately)   300 mg Per Tube Daily 04/15/20 1307 04/18/20 1043   04/16/20 0900  sulfamethoxazole-trimethoprim (BACTRIM DS) 800-160 MG per tablet 1 tablet  Discontinue     1 tablet Oral Once per day on Mon Wed Fri 04/15/20 1350     04/15/20 1245  dolutegravir (TIVICAY) tablet 50 mg     Discontinue     50 mg Oral Daily 04/15/20 1202     04/15/20 1245  lamiVUDine (EPIVIR) tablet 300 mg  Status:  Discontinued       Note to Pharmacy: I DO NOT renally dose lamivudine and I have never had problems NOT adjusting it. I want pt to be on Dovato but still not on formulary here (unfortunately)   300 mg Oral Daily 04/15/20 1202 04/15/20 1307    04/13/20 1737  ceFAZolin (ANCEF) 2-4 GM/100ML-% IVPB       Note to Pharmacy: Desiree Hane   : cabinet override      04/13/20 1737 04/14/20 0544   04/13/20 0000  ceFAZolin (ANCEF) IVPB 2g/100 mL premix        2 g 200 mL/hr over 30 Minutes Intravenous To Radiology 04/12/20 1454 04/13/20 0834      Subjective:   No acute issues or events overnight, tolerating p.o. much more appropriately this morning ate nearly 100% of his meal, denies nausea, vomiting, diarrhea, constipation, headache, fevers, chills.  Objective:   Vitals:   04/22/20 1941 04/23/20 0005 04/23/20 0406 04/23/20 0500  BP: (!) 171/96 (!) 161/88 (!) 145/98   Pulse: 93 99 (!) 108   Resp: 19 18 18    Temp: 98.3 F (36.8 C) 97.8 F (36.6 C) 98.1 F (36.7 C)   TempSrc: Oral Axillary Oral   SpO2: 97% 100% 100%   Weight:    69.3 kg    Intake/Output Summary (Last 24 hours) at 04/23/2020 0719 Last data filed at 04/23/2020 0600 Gross per 24 hour  Intake 5220 ml  Output 400 ml  Net 4820 ml   Filed Weights   04/21/20 0311 04/22/20 0500 04/23/20 0500  Weight: 65 kg 70.8 kg 69.3 kg   Exam  General: Well developed, chronically ill appearing, NAD  HEENT: NCAT,  mucous membranes moist  Cardiovascular: S1 S2 auscultated, RRR.  Respiratory: Clear to auscultation bilaterally   Abdomen: Soft, nontender, nondistended, + bowel sounds  Extremities: warm dry without cyanosis clubbing or edema, RUE contracture  Neuro: Somnolent, sluggish to answer questions but does so appropriately after given adequate time  Skin: Diffuse rash  Data Reviewed: I have personally reviewed following labs and imaging studies  CBC: Recent Labs  Lab 04/20/20 0958 04/21/20 0258 04/21/20 2106 04/22/20 0222 04/23/20 0130  WBC 5.6 5.2 6.0 6.7 7.0  HGB 8.9* 9.0* 9.2* 8.7* 8.6*  HCT 27.9* 28.5* 29.0* 26.4* 26.2*  MCV 91.5 91.9 92.1 88.6 91.6  PLT 174 139* 146* 131* 161   Basic Metabolic Panel: Recent Labs  Lab 04/16/20 0740  04/16/20 0740 04/18/20 0343 04/18/20 0343 04/20/20 0958 04/21/20 0258 04/21/20 2106 04/22/20 0222 04/23/20 0130  NA 139   < > 137   < > 137 138 137 136 136  K 3.5   < > 3.6   < > 3.4* 3.8 4.3 4.6 4.1  CL 103   < > 100   < > 99 100 100 100 101  CO2 25   < > 26   < > 27 28 25 23 25   GLUCOSE 106*   < > 132*   < > 120* 126* 139* 94 152*  BUN 48*   < > 55*   < > 38* 20 31* 36* 50*  CREATININE 6.56*   < >  5.79*   < > 5.77* 4.36* 5.63* 6.20* 7.80*  CALCIUM 7.7*   < > 7.9*   < > 8.4* 8.3* 8.3* 8.3* 8.3*  PHOS 3.5  --  2.8  --  3.9  --  2.7  --   --    < > = values in this interval not displayed.   GFR: CrCl cannot be calculated (Unknown ideal weight.). Liver Function Tests: Recent Labs  Lab 04/20/20 0958 04/21/20 0258 04/21/20 2106 04/22/20 0222 04/23/20 0130  AST  --  27  --  23 20  ALT  --  16  --  15 14  ALKPHOS  --  76  --  82 81  BILITOT  --  0.5  --  0.2* 0.7  PROT  --  6.2*  --  6.0* 5.8*  ALBUMIN 2.5* 2.4* 2.5* 2.5* 2.5*   No results for input(s): LIPASE, AMYLASE in the last 168 hours. No results for input(s): AMMONIA in the last 168 hours. Coagulation Profile: No results for input(s): INR, PROTIME in the last 168 hours. Cardiac Enzymes: No results for input(s): CKTOTAL, CKMB, CKMBINDEX, TROPONINI in the last 168 hours. BNP (last 3 results) Recent Labs    06/22/19 1505  PROBNP 170.0*   HbA1C: No results for input(s): HGBA1C in the last 72 hours. CBG: Recent Labs  Lab 04/22/20 1144 04/22/20 1609 04/22/20 1942 04/23/20 0007 04/23/20 0358  GLUCAP 158* 156* 79 121* 193*   Lipid Profile: No results for input(s): CHOL, HDL, LDLCALC, TRIG, CHOLHDL, LDLDIRECT in the last 72 hours. Thyroid Function Tests: No results for input(s): TSH, T4TOTAL, FREET4, T3FREE, THYROIDAB in the last 72 hours. Anemia Panel: No results for input(s): VITAMINB12, FOLATE, FERRITIN, TIBC, IRON, RETICCTPCT in the last 72 hours. Urine analysis:    Component Value Date/Time    COLORURINE STRAW (A) 04/11/2020 0223   APPEARANCEUR CLEAR 04/11/2020 0223   LABSPEC 1.011 04/11/2020 0223   PHURINE 5.0 04/11/2020 0223   GLUCOSEU 50 (A) 04/11/2020 0223   HGBUR SMALL (A) 04/11/2020 0223   BILIRUBINUR NEGATIVE 04/11/2020 0223   KETONESUR NEGATIVE 04/11/2020 0223   PROTEINUR >=300 (A) 04/11/2020 0223   UROBILINOGEN 0.2 11/29/2014 2003   NITRITE NEGATIVE 04/11/2020 0223   LEUKOCYTESUR NEGATIVE 04/11/2020 0223    Recent Results (from the past 240 hour(s))  Culture, blood (routine x 2)     Status: None   Collection Time: 04/14/20 11:31 AM   Specimen: BLOOD RIGHT HAND  Result Value Ref Range Status   Specimen Description BLOOD RIGHT HAND  Final   Special Requests   Final    BOTTLES DRAWN AEROBIC ONLY Blood Culture results may not be optimal due to an inadequate volume of blood received in culture bottles   Culture   Final    NO GROWTH 5 DAYS Performed at [facility], [place] [address], [place], [place] [address]    Report Status 04/19/2020 FINAL  Final  Culture, blood (routine x 2)     Status: None   Collection Time: 04/14/20 11:36 AM   Specimen: BLOOD RIGHT HAND  Result Value Ref Range Status   Specimen Description BLOOD RIGHT HAND  Final   Special Requests   Final    BOTTLES DRAWN AEROBIC ONLY Blood Culture results may not be optimal due to an inadequate volume of blood received in culture bottles   Culture   Final    NO GROWTH 5 DAYS Performed at [facility], [place] [address], [place], [place]  [address]    Report Status 04/19/2020 FINAL  Final  CSF culture     Status: None   Collection Time: 04/14/20  6:27 PM   Specimen: PATH Cytology CSF; Cerebrospinal Fluid  Result Value Ref Range Status   Specimen Description CSF  Final   Special Requests NONE  Final   Gram Stain NO WBC SEEN NO ORGANISMS SEEN CYTOSPIN SMEAR   Final   Culture   Final    NO GROWTH 3 DAYS Performed at [facility], [address], [place], [place] [address]    Report  Status 04/18/2020 FINAL  Final  Fungus Stain     Status: None   Collection Time: 04/14/20  6:27 PM   Specimen: PATH Cytology CSF; Cerebrospinal Fluid  Result Value Ref Range Status   FUNGUS STAIN Final report  Final    Comment: (NOTE) Performed At: [facility] [address] [place], [place] [id number] Rush Farmer MD IO:[id number]    Fungal Source CSF  Final    Comment: Performed at [facility], [place] [address] [place], [place] [address]  Fungal Stain reflex     Status: None   Collection Time: 04/14/20  6:27 PM  Result Value Ref Range Status   Fungal stain result 1 Comment  Final    Comment: (NOTE) KOH/Calcofluor preparation:  no fungus observed. Performed At: [facility] [place], [place] [id number] Rush Farmer MD QM:[id number]   Acid Fast Smear (AFB)     Status: None   Collection Time: 04/20/20  2:08 PM   Specimen: Skin Biopsy  Result Value Ref Range Status   AFB Specimen Processing Comment  Final    Comment: Tissue Grinding and Digestion/Decontamination   Acid Fast Smear Negative  Final    Comment: (NOTE) Performed At: [facility] [place], [place] [id number] Rush Farmer MD UU:[id number]    Source (AFB) SKIN  Final    Comment: BIOPSY Performed at [facility], [place] [address], [place], [place] [address]       Radiology Studies: No results found.   Scheduled Meds: . amLODipine  10 mg Oral Daily  . carvedilol  3.125 mg Oral BID WC  . Chlorhexidine Gluconate Cloth  6 each Topical Q0600  . clopidogrel  75 mg Oral Daily  . darbepoetin (ARANESP) injection - DIALYSIS  40 mcg Intravenous Q Wed-HD  . dolutegravir  50 mg Oral Daily  . feeding supplement (NEPRO CARB STEADY)  237 mL Oral TID BM  . heparin injection (subcutaneous)  5,000 Units Subcutaneous Q8H  . insulin aspart  0-9 Units Subcutaneous TID WC  . lamiVUDine  300 mg Oral Daily  . levETIRAcetam  500 mg Oral Q12H  .  lidocaine-EPINEPHrine  20 mL Intradermal Once  . LORazepam  2 mg Intravenous Once  . pantoprazole  40 mg Oral Daily  . pravastatin  80 mg Oral QHS  . sulfamethoxazole-trimethoprim  1 tablet Oral Once per day on Mon Wed Fri  . tamsulosin  0.4 mg Oral Daily   Continuous Infusions: . sodium chloride    . sodium chloride    . sodium chloride    . sodium chloride    . ferric gluconate (FERRLECIT/NULECIT) IV Stopped (04/20/20 1419)     LOS: 13 days   Time Spent in minutes   30 minutes  Little Ishikawa D.O. on 04/23/2020 at 7:19 AM  After 7pm go to [email or web address]

## 2020-04-23 NOTE — TOC Progression Note (Signed)
Transition of Care [facility]) - Progression Note    Patient Details  Name: Richard [name] MRN: [id number] Date of Birth: 07/02/1956  [facility] CM/SW [place], [place] Phone Number: 04/23/2020, 10:35 AM  Clinical Narrative:   CSW spoke with patient's daughter about bed offers, and she chose [facility]. CSW confirmed bed availability at [facility]. CSW contacted Renal Navigator to discuss placement decision, will work on outpatient dialysis setting. Patient will need SCAT application for dialysis transport while at SNF, CSW to complete with patient. Also need to evaluate patient's ability to go to HD in a chair, unsure if he has been out of chair yet. Renal Navigator to update HD team about need to take patient in chair.     Expected Discharge Plan: [place] Barriers to Discharge: Continued Medical Work up, Waiting for outpatient dialysis, Transportation, Ship broker  Expected Discharge Plan and Services Expected Discharge Plan: [place] Choice: [place] arrangements for the past 2 months: Single Family Home                                       Social Determinants of Health (SDOH) Interventions    Readmission Risk Interventions Readmission Risk Prevention Plan 02/15/2020  Transportation Screening Complete  PCP or Specialist Appt within 5-7 Days Not Complete  Not Complete comments pending disposition  Home Care Screening Complete  Medication Review (RN CM) Referral to Pharmacy  Some recent data might be hidden

## 2020-04-23 NOTE — Progress Notes (Signed)
Daily Progress Note   Patient Name: Richard [name]       Date: 04/23/2020 DOB: [birth year]  Age: 64 y.o. MRN#: [id number] Attending Physician: Little Ishikawa, MD Primary Care Physician: [name], MD Admit Date: 04/10/2020   Reason for Consultation/Follow-up: Disposition and Establishing goals of care  Subjective: Chart review performed. Received report from primary RN. RN had no acute concerns. She states that the patient did take all his medications today. She offered to help him with lunch but he was not interested in eating at the time.  Went to visit patient at bedside - no family present. Patient was lying in bed asleep. He did wake up to voice/gentle touch several times. Conversation was attempted but he but promptly fell back asleep/closed his eyes and did not answer questions.  Called and spoke with Richard [name]. Update provided and answered all questions - skin lesion biopsy results are still pending. Patient has shown gradual progress over hospital course - Richard [name] states the plan is for him to discharge to [facility]. Offered outpatient palliative care support after discharge and she was agreeable.   Length of Stay: 13  Current Medications: Scheduled Meds:  . amLODipine  10 mg Oral Daily  . carvedilol  3.125 mg Oral BID WC  . Chlorhexidine Gluconate Cloth  6 each Topical Q0600  . clopidogrel  75 mg Oral Daily  . [START ON 04/25/2020] darbepoetin (ARANESP) injection - DIALYSIS  150 mcg Intravenous Q Wed-HD  . dolutegravir  50 mg Oral Daily  . feeding supplement (NEPRO CARB STEADY)  237 mL Oral TID BM  . heparin injection (subcutaneous)  5,000 Units Subcutaneous Q8H  . insulin aspart  0-9 Units Subcutaneous TID WC  . lamiVUDine  300 mg Oral Daily  .  levETIRAcetam  500 mg Oral Q12H  . lidocaine-EPINEPHrine  20 mL Intradermal Once  . LORazepam  2 mg Intravenous Once  . pantoprazole  40 mg Oral Daily  . pravastatin  80 mg Oral QHS  . sulfamethoxazole-trimethoprim  1 tablet Oral Once per day on Mon Wed Fri  . tamsulosin  0.4 mg Oral Daily    Continuous Infusions: . sodium chloride    . sodium chloride    . sodium chloride    . sodium chloride    . ferric gluconate (FERRLECIT/NULECIT) IV  Stopped (04/20/20 1419)    PRN Meds: sodium chloride, sodium chloride, sodium chloride, sodium chloride, alteplase, alteplase, heparin, heparin, heparin, hydrALAZINE, labetalol, lidocaine (PF), lidocaine-prilocaine, pentafluoroprop-tetrafluoroeth  Physical Exam Constitutional:      General: He is not in acute distress. HENT:     Head: Normocephalic and atraumatic.  Pulmonary:     Effort: No respiratory distress.  Skin:    General: Skin is warm and dry.  Neurological:     Mental Status: He is lethargic.             Vital Signs: BP (!) 152/70 (BP Location: Left Arm)   Pulse 99   Temp 98.6 F (37 C) (Oral)   Resp 16   Wt 69.3 kg   SpO2 100%   BMI 20.72 kg/m  SpO2: SpO2: 100 % O2 Device: O2 Device: Room Air O2 Flow Rate: O2 Flow Rate (L/min): 2 L/min  Intake/output summary:   Intake/Output Summary (Last 24 hours) at 04/23/2020 1318 Last data filed at 04/23/2020 0600 Gross per 24 hour  Intake 4980 ml  Output --  Net 4980 ml   LBM: Last BM Date: 04/23/20 Baseline Weight: Weight: 69.1 kg Most recent weight: Weight: 69.3 kg       Palliative Assessment/Data: PPS 30%      Patient Active Problem List   Diagnosis Date Noted  . Creatinine elevation   . Goals of care, counseling/discussion   . Hypertensive urgency   . Meningoencephalitis   . Palliative care by specialist   . ESRD (end stage renal disease) ([place])   . AIDS (acquired immune deficiency syndrome) ([place]) 04/13/2020  . Encephalopathy 04/13/2020  . Seizure ([place])  04/10/2020  . History of cerebrovascular accident (CVA) with residual deficit 03/19/2020  . CKD (chronic kidney disease) stage 5, GFR less than 15 ml/min ([facility]) 02/15/2020  . Altered mental status 02/13/2020  . Neuropathy in diabetes ([place])   . Migraines   . Hypertension   . GERD (gastroesophageal reflux disease)   . Diabetes mellitus, type II, insulin dependent ([place])   . Arthritis   . Hyperlipidemia 08/25/2012  . Chronic kidney disease, stage 4 (severe) ([place]) 08/23/2012  . Tobacco abuse 08/23/2012  . Malnutrition of moderate degree ([place]) 08/23/2012  . 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 He was started back on Keppra and hypertension was treated with labetalol and hydralazine. MRI of brain showed 2 mm infarct in left parietal lobe. A tunneled dialysis catheter was placed and Dialysis was started on 7/23 with imporvement. 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 Diffuse rash present concerning for possible disseminated cryptococcal infection. He is awaiting LP by IR.  Assessment: Acute metabolic encephalopathy Malnutrition of moderate degree ESRD now on dialysis Acute stroke with history of stroke Seizures New diagnosis of HIV/AIDS Failure to thrive Skin lesions History of noncompliance  Recommendations/Plan:  Continue current medical treatment - full scope  Continue full code status  Skin lesion biopsy results are still pending  Plan is for  discharge to [facility] when medically  stable and when outpatient dialysis has been established  Recommend outpatient Palliative Care to follow when discharged - [facility] consult placed; TOC and [place] liaison notified  PMT will continue to follow holistically  Goals of Care and Additional Recommendations:  Limitations on Scope of Treatment: Full Scope Treatment  Code Status:    Code Status Orders  (From admission, onward)         Start     Ordered   04/10/20 2332  Full code  Continuous        04/10/20 2331        Code Status History    Date Active Date Inactive Code Status Order ID Comments User Context   02/13/2020 2345 02/16/2020 2347 Full Code [id number]  Elwyn Reach, MD Inpatient   11/29/2014 2333 11/30/2014 1920 Full Code [id number]  Allyne Gee, MD Inpatient   Advance Care Planning Activity       Prognosis:   Unable to determine  Discharge Planning:  [facility] for rehab with Palliative care service follow-up  Care plan was discussed with primary RN, [name]/family, TOC, [place] liaison  Thank you for allowing the Palliative Medicine Team to assist in the care of this patient.   Total Time 15 minutes Prolonged Time Billed  no       Greater than 50%  of this time was spent counseling and coordinating care related to the above assessment and plan.  Lin Landsman, NP  Please contact Palliative Medicine Team phone at [phone] for questions and concerns.

## 2020-04-23 NOTE — Progress Notes (Signed)
Subjective:  Reportedly refusing meds today-  Due for HD today  Objective Vital signs in last 24 hours: Vitals:   04/22/20 1941 04/23/20 0005 04/23/20 0406 04/23/20 0500  BP: (!) 171/96 (!) 161/88 (!) 145/98   Pulse: 93 99 (!) 108   Resp: 19 18 18    Temp: 98.3 F (36.8 C) 97.8 F (36.6 C) 98.1 F (36.7 C)   TempSrc: Oral Axillary Oral   SpO2: 97% 100% 100%   Weight:    69.3 kg   Weight change: -1.5 kg  Intake/Output Summary (Last 24 hours) at 04/23/2020 0710 Last data filed at 04/23/2020 0600 Gross per 24 hour  Intake 5220 ml  Output 400 ml  Net 4820 ml    Assessment/ Plan: Pt is a 64 y.o. yo male with HIV/SZ/advanced CKD as well as noncompliance who was admitted on 04/10/2020 with  sz but also found to progress to ESRD and has had dialysis initiated this hosp  Assessment/Plan: 1. sz-  Per primary team-  Anti sz meds as he will consent 2. ESRD- new diagnosis this hospital admit.  Have initiated HD-  On MWF schedule via [facility]-  Felt to be not surgically stable for an AVF/AVG.  CLIP in process-  Social barriers, will need SNF at discharge.  He does not immediately recall that he has been started on dialysis  3. Anemia- iron repletion in progress-  Also ESA-  Will inc dose for hgb in the 8's  4. Secondary hyperparathyroidism-  PTH 160- calc /phos WNL- on no meds  5. HTN/volume-  Overall high-  Refusing meds-  On reg of norvasc 10, coreg 3.125 (may inc), then hydralazine and labetalol PRN 6. HIV-  [place] 7. FTT-  Albumin under 3 and will need SNF placement at discharge   [place]: Basic Metabolic Panel: Recent Labs  Lab 04/18/20 0343 04/18/20 0343 04/20/20 0958 04/21/20 0258 04/21/20 2106 04/22/20 0222 04/23/20 0130  NA 137   < > 137   < > 137 136 136  K 3.6   < > 3.4*   < > 4.3 4.6 4.1  CL 100   < > 99   < > 100 100 101  CO2 26   < > 27   < > 25 23 25   GLUCOSE 132*   < > 120*   < > 139* 94 152*  BUN 55*   < > 38*   < > 31* 36* 50*  CREATININE 5.79*    < > 5.77*   < > 5.63* 6.20* 7.80*  CALCIUM 7.9*   < > 8.4*   < > 8.3* 8.3* 8.3*  PHOS 2.8  --  3.9  --  2.7  --   --    < > = values in this interval not displayed.   Liver Function Tests: Recent Labs  Lab 04/21/20 0258 04/21/20 0258 04/21/20 2106 04/22/20 0222 04/23/20 0130  AST 27  --   --  23 20  ALT 16  --   --  15 14  ALKPHOS 76  --   --  82 81  BILITOT 0.5  --   --  0.2* 0.7  PROT 6.2*  --   --  6.0* 5.8*  ALBUMIN 2.4*   < > 2.5* 2.5* 2.5*   < > = values in this interval not displayed.   No results for input(s): LIPASE, AMYLASE in the last 168 hours. No results for input(s): AMMONIA in the last  168 hours. CBC: Recent Labs  Lab 04/20/20 0958 04/20/20 0958 04/21/20 0258 04/21/20 0258 04/21/20 2106 04/22/20 0222 04/23/20 0130  WBC 5.6   < > 5.2   < > 6.0 6.7 7.0  HGB 8.9*   < > 9.0*   < > 9.2* 8.7* 8.6*  HCT 27.9*   < > 28.5*   < > 29.0* 26.4* 26.2*  MCV 91.5  --  91.9  --  92.1 88.6 91.6  PLT 174   < > 139*   < > 146* 131* 201   < > = values in this interval not displayed.   Cardiac Enzymes: No results for input(s): CKTOTAL, CKMB, CKMBINDEX, TROPONINI in the last 168 hours. CBG: Recent Labs  Lab 04/22/20 1144 04/22/20 1609 04/22/20 1942 04/23/20 0007 04/23/20 0358  GLUCAP 158* 156* 79 121* 193*    Iron Studies: No results for input(s): IRON, TIBC, TRANSFERRIN, FERRITIN in the last 72 hours. Studies/Results: No results found. Medications: Infusions: . sodium chloride    . sodium chloride    . sodium chloride    . sodium chloride    . ferric gluconate (FERRLECIT/NULECIT) IV Stopped (04/20/20 1419)    Scheduled Medications: . amLODipine  10 mg Oral Daily  . carvedilol  3.125 mg Oral BID WC  . Chlorhexidine Gluconate Cloth  6 each Topical Q0600  . clopidogrel  75 mg Oral Daily  . darbepoetin (ARANESP) injection - DIALYSIS  40 mcg Intravenous Q Wed-HD  . dolutegravir  50 mg Oral Daily  . feeding supplement (NEPRO CARB STEADY)  237 mL Oral TID  BM  . heparin injection (subcutaneous)  5,000 Units Subcutaneous Q8H  . insulin aspart  0-9 Units Subcutaneous TID WC  . lamiVUDine  300 mg Oral Daily  . levETIRAcetam  500 mg Oral Q12H  . lidocaine-EPINEPHrine  20 mL Intradermal Once  . LORazepam  2 mg Intravenous Once  . pantoprazole  40 mg Oral Daily  . pravastatin  80 mg Oral QHS  . sulfamethoxazole-trimethoprim  1 tablet Oral Once per day on Mon Wed Fri  . tamsulosin  0.4 mg Oral Daily    have reviewed scheduled and prn medications.  Physical Exam: General: agreeable-  Has no recollection of being started on HD Heart: tachy Lungs: mostly clear Abdomen: soft, non tender Extremities: no edema-  Diffuse rash Dialysis Access: right sided TDC     04/23/2020,7:10 AM  LOS: 13 days

## 2020-04-23 NOTE — Progress Notes (Signed)
  Speech Language Pathology Treatment: Dysphagia  Patient Details Name: Richard [name] MRN: [id number] DOB: 23-May-1956 Today's Date: 04/23/2020 Time: 0850-0900 SLP Time Calculation (min) (ACUTE ONLY): 10 min  Assessment / Plan / Recommendation Clinical Impression  Pt slumped to the side in bed, fully consumed breakfast tray in front of him, messy covers, no food in mouth. Pt reports he fed himself. RN and SLP repositioned pt and immediately started slumping back. Pt needed supervision assist and some minimal reminders to stay upright. He did have prolonged mastication of solids, but this cleared with a liquid wash or verbal reminder. Overall, pt is capable of self feeding and tolerating foods well, though he does benefit from supervision with meals due to decreased awareness. Suspect this is his baseline function though f/u with SLP at next level of care may be beneficial to address cognition.   HPI HPI: Pt is a 64 y.o. male, with medical history significant of diabetes, osteoarthritis, hypertension, CKD 5, neuropathies, previous CVA with residual right-sided weakness, migraine headaches , patient with extremely poor compliance, he has refused hemodialysis access in the past, who presented to ED via EMS status post seizures witnessed by roommate. In ED patient was postictal, he was loaded with Keppra. CT head negative. BSE 02/14/20: Pt had a little throat clearing initially while drinking thin liquids in a suboptimal position, but once repositioned more fully upright, no further s/s of aspiration were noted. 04/11/20 pt without s/s aspiration; placed on regular/thin. 7/22 change in status; MD paged. Made NPO. To undergo further testing. MRI 7/22: 2 mm acute or early subacute infarct within the left parietal lobe periatrial white matter.      SLP Plan  All goals met       Recommendations  Diet recommendations: Regular;Thin liquid Liquids provided via: Cup;Straw Medication Administration:  Whole meds with liquid Supervision: Patient able to self feed;Intermittent supervision to cue for compensatory strategies Compensations: Slow rate;Small sips/bites Postural Changes and/or Swallow Maneuvers: Seated upright 90 degrees                Oral Care Recommendations: Oral care BID Follow up Recommendations: None Plan: All goals met       GO               Herbie Baltimore, MA CCC-SLP  Acute Rehabilitation Services Pager [phone] Office [phone]  Lynann Beaver 04/23/2020, 9:46 AM

## 2020-04-23 NOTE — Progress Notes (Signed)
Physical Therapy Treatment Patient Details Name: Richard [name] MRN: [id number] DOB: 23-Sep-1955 Today's Date: 04/23/2020    History of Present Illness 64 year old male with medical history of Arthritis, Diabetes mellitus, type II, insulin dependent, GERD, HTN, Immune deficiency disorder, migraines, neuropathy in diabetes, ruptured lumbar disc, ESRD stage V , GFR < 15 ml/min, declined  Dialysis in the past, CVA with residual right sided weakness, seizures admitted 7/20 through ED with seizure activity lasting 4 minutes per roommate, and encephalopathy. MRI of brain showed 2 mm infarct in left parietal lobe. A tunneled dialysis catheter was placed and  Dialysis was started on 7/23; New diagnosis this admission of HIV/AIDS     PT Comments    Pt progressing slowly towards physical therapy goals. Was able to tolerate EOB activity ~10 minutes. Focus of session became peri-care and linen change as pt with both bowel and bladder incontinence in the bed in sitting. Pt fatigued quickly in sitting and was returned to supine to clean up. Will continue to follow and progress as able per POC.    Follow Up Recommendations  SNF;Supervision/Assistance - 24 hour     Equipment Recommendations  None recommended by PT    Recommendations for Other Services       Precautions / Restrictions Precautions Precautions: Fall Restrictions Weight Bearing Restrictions: No    Mobility  Bed Mobility Overal bed mobility: Needs Assistance Bed Mobility: Supine to Sit Rolling: Mod assist;+2 for physical assistance   Supine to sit: Mod assist;+2 for physical assistance     General bed mobility comments: VC's for sequencing and hand placement. Assist required for transition to/from EOB as well as for rolling to clean up BM.   Transfers                 General transfer comment: Not able to progress to OOB mobility this date.   Ambulation/Gait                 Stairs              Wheelchair Mobility    Modified Rankin (Stroke Patients Only)       Balance Overall balance assessment: Needs assistance Sitting-balance support: No upper extremity supported;Feet supported;Feet unsupported Sitting balance-Leahy Scale: Poor   Postural control: Left lateral lean;Posterior lean                                  Cognition Arousal/Alertness: Lethargic Behavior During Therapy: Flat affect Overall Cognitive Status: Impaired/Different from baseline Area of Impairment: Attention;Memory;Following commands;Safety/judgement;Awareness;Problem solving                   Current Attention Level: Sustained Memory: Decreased recall of precautions;Decreased short-term memory Following Commands: Follows one step commands with increased time;Follows multi-step commands inconsistently Safety/Judgement: Decreased awareness of safety;Decreased awareness of deficits Awareness: Intellectual Problem Solving: Slow processing;Decreased initiation;Difficulty sequencing;Requires verbal cues;Requires tactile cues General Comments: Pt more awake than initial eval. Pt still lethargic with needing increased time for one step commands and slow processing. Pt able to state name and respond to questions with 1 word responses      Exercises      General Comments        Pertinent Vitals/Pain Pain Assessment: No/denies pain Pain Intervention(s): Monitored during session    Home Living  Prior Function            PT Goals (current goals can now be found in the care plan section) Acute Rehab PT Goals Patient Stated Goal: none stated PT Goal Formulation: Patient unable to participate in goal setting Time For Goal Achievement: 04/30/20 Potential to Achieve Goals: Fair Progress towards PT goals: Progressing toward goals    Frequency    Min 3X/week      PT Plan Current plan remains appropriate    Co-evaluation               AM-PAC PT "6 Clicks" Mobility   Outcome Measure  Help needed turning from your back to your side while in a flat bed without using bedrails?: Total Help needed moving from lying on your back to sitting on the side of a flat bed without using bedrails?: Total Help needed moving to and from a bed to a chair (including a wheelchair)?: Total Help needed standing up from a chair using your arms (e.g., wheelchair or bedside chair)?: Total Help needed to walk in hospital room?: Total Help needed climbing 3-5 steps with a railing? : Total 6 Click Score: 6    End of Session   Activity Tolerance: Patient limited by fatigue (weakness) Patient left: in bed;with call bell/phone within reach;with bed alarm set Nurse Communication: Mobility status PT Visit Diagnosis: Muscle weakness (generalized) (M62.81);Difficulty in walking, not elsewhere classified (R26.2)     Time: 9937-1696 PT Time Calculation (min) (ACUTE ONLY): 32 min  Charges:  $Therapeutic Activity: 23-37 mins                     Rolinda Roan, PT, DPT Acute Rehabilitation Services Pager: [phone] Office: [phone]    [name] 04/23/2020, 12:39 PM

## 2020-04-23 NOTE — Progress Notes (Signed)
[facility] Infectious Disease  Date of Admission:  04/10/2020             ASSESSMENT:  Mr. Saintjean continues have varying oral intake with primary team evaluating options and direction of care. Appears to be taking his HIV medications daily as prescribed with 1 refused dose thus far. Discussed importance of taking his medication as prescribed. Results of biopsy remain pending. Continue current dose of dolutegravir and lamivudine supplemented with Bactrim for OI prophylaxis. [facility] financial assistance staff working on assistance programs and will get established with [facility].   PLAN:  1. Continue current dose of dolutegravir and lamivudine with bactrim for OI prophylaxis.  2. ESRD and dialysis per nephrology.  3. Goals of care per primary team.  4. Await skin lesion biopsy results.   Principal Problem:   Encephalopathy Active Problems:   Tobacco abuse   Malnutrition of moderate degree (HCC)   Hyperlipidemia   Neuropathy in diabetes (HCC)   GERD (gastroesophageal reflux disease)   Diabetes mellitus, type II, insulin dependent (HCC)   Altered mental status   CKD (chronic kidney disease) stage 5, GFR less than 15 ml/min (HCC)   History of cerebrovascular accident (CVA) with residual deficit   Seizure ([place])   AIDS (acquired immune deficiency syndrome) ([place])   ESRD (end stage renal disease) ([place])   Creatinine elevation   Goals of care, counseling/discussion   Hypertensive urgency   Meningoencephalitis   Palliative care by specialist   . amLODipine  10 mg Oral Daily  . carvedilol  3.125 mg Oral BID WC  . Chlorhexidine Gluconate Cloth  6 each Topical Q0600  . clopidogrel  75 mg Oral Daily  . [START ON 04/25/2020] darbepoetin (ARANESP) injection - DIALYSIS  150 mcg Intravenous Q Wed-HD  . dolutegravir  50 mg Oral Daily  . feeding supplement (NEPRO CARB STEADY)  237 mL Oral TID BM  . heparin injection (subcutaneous)  5,000 Units Subcutaneous Q8H  . insulin aspart  0-9  Units Subcutaneous TID WC  . lamiVUDine  300 mg Oral Daily  . levETIRAcetam  500 mg Oral Q12H  . lidocaine-EPINEPHrine  20 mL Intradermal Once  . LORazepam  2 mg Intravenous Once  . pantoprazole  40 mg Oral Daily  . pravastatin  80 mg Oral QHS  . sulfamethoxazole-trimethoprim  1 tablet Oral Once per day on Mon Wed Fri  . tamsulosin  0.4 mg Oral Daily    SUBJECTIVE:  Afebrile overnight with no acute events. Doing okay. Waiting for dialysis.   No Known Allergies   Review of Systems: Review of Systems  Constitutional: Negative for chills, fever and weight loss.  Respiratory: Negative for cough, shortness of breath and wheezing.   Cardiovascular: Negative for chest pain and leg swelling.  Gastrointestinal: Negative for abdominal pain, constipation, diarrhea, nausea and vomiting.  Skin: Negative for rash.      OBJECTIVE: Vitals:   04/23/20 0500 04/23/20 0749 04/23/20 0755 04/23/20 1205  BP:  (!) 177/82 (!) 158/74 (!) 161/78  Pulse:  90  99  Resp:  18  16  Temp:  97.6 F (36.4 C)  98.6 F (37 C)  TempSrc:  Oral  Oral  SpO2:  100%  100%  Weight: 69.3 kg      Body mass index is 20.72 kg/m.  Physical Exam Constitutional:      General: He is not in acute distress.    Appearance: He is well-developed.  Cardiovascular:     Rate and  Rhythm: Normal rate and regular rhythm.     Heart sounds: Normal heart sounds.  Pulmonary:     Effort: Pulmonary effort is normal.     Breath sounds: Normal breath sounds.  Skin:    General: Skin is warm and dry.  Neurological:     Mental Status: He is alert and oriented to person, place, and time.  Psychiatric:        Behavior: Behavior normal.        Thought Content: Thought content normal.        Judgment: Judgment normal.     Lab Results Lab Results  Component Value Date   WBC 7.0 04/23/2020   HGB 8.6 (L) 04/23/2020   HCT 26.2 (L) 04/23/2020   MCV 91.6 04/23/2020   PLT 201 04/23/2020    Lab Results  Component Value Date    CREATININE 7.80 (H) 04/23/2020   BUN 50 (H) 04/23/2020   NA 136 04/23/2020   K 4.1 04/23/2020   CL 101 04/23/2020   CO2 25 04/23/2020    Lab Results  Component Value Date   ALT 14 04/23/2020   AST 20 04/23/2020   ALKPHOS 81 04/23/2020   BILITOT 0.7 04/23/2020     Microbiology: Recent Results (from the past 240 hour(s))  Culture, blood (routine x 2)     Status: None   Collection Time: 04/14/20 11:31 AM   Specimen: BLOOD RIGHT HAND  Result Value Ref Range Status   Specimen Description BLOOD RIGHT HAND  Final   Special Requests   Final    BOTTLES DRAWN AEROBIC ONLY Blood Culture results may not be optimal due to an inadequate volume of blood received in culture bottles   Culture   Final    NO GROWTH 5 DAYS Performed at [facility], [place] [address] [place], [place] [address]    Report Status 04/19/2020 FINAL  Final  Culture, blood (routine x 2)     Status: None   Collection Time: 04/14/20 11:36 AM   Specimen: BLOOD RIGHT HAND  Result Value Ref Range Status   Specimen Description BLOOD RIGHT HAND  Final   Special Requests   Final    BOTTLES DRAWN AEROBIC ONLY Blood Culture results may not be optimal due to an inadequate volume of blood received in culture bottles   Culture   Final    NO GROWTH 5 DAYS Performed at [facility], [place] [address], [place], [place] [address]    Report Status 04/19/2020 FINAL  Final  CSF culture     Status: None   Collection Time: 04/14/20  6:27 PM   Specimen: PATH Cytology CSF; Cerebrospinal Fluid  Result Value Ref Range Status   Specimen Description CSF  Final   Special Requests NONE  Final   Gram Stain NO WBC SEEN NO ORGANISMS SEEN CYTOSPIN SMEAR   Final   Culture   Final    NO GROWTH 3 DAYS Performed at [facility], [address] [place], [place] [address]    Report Status 04/18/2020 FINAL  Final  Fungus Stain     Status: None   Collection Time: 04/14/20  6:27 PM   Specimen: PATH Cytology CSF; Cerebrospinal  Fluid  Result Value Ref Range Status   FUNGUS STAIN Final report  Final    Comment: (NOTE) Performed At: [facility] [place], [place] [id number] Rush Farmer MD WF:[id number]    Fungal Source CSF  Final  Comment: Performed at [place] [facility], [place] [address] [place], [place] [address]  Fungal Stain reflex     Status: None   Collection Time: 04/14/20  6:27 PM  Result Value Ref Range Status   Fungal stain result 1 Comment  Final    Comment: (NOTE) KOH/Calcofluor preparation:  no fungus observed. Performed At: [facility] [place], [place] [id number] Rush Farmer MD FV:[id number]   Acid Fast Smear (AFB)     Status: None   Collection Time: 04/20/20  2:08 PM   Specimen: Skin Biopsy  Result Value Ref Range Status   AFB Specimen Processing Comment  Final    Comment: Tissue Grinding and Digestion/Decontamination   Acid Fast Smear Negative  Final    Comment: (NOTE) Performed At: [facility] [place], [place] [id number] Rush Farmer MD [id number]    Source (AFB) SKIN  Final    Comment: BIOPSY Performed at [facility], [place] [address] [place], [place] [address]      Terri Piedra, [facility]  04/23/2020  12:28 PM

## 2020-04-24 ENCOUNTER — Other Ambulatory Visit: Payer: Self-pay | Admitting: Physician Assistant

## 2020-04-24 LAB — COMPREHENSIVE METABOLIC PANEL
ALT: 12 U/L (ref 0–44)
AST: 15 U/L (ref 15–41)
Albumin: 2.4 g/dL — ABNORMAL LOW (ref 3.5–5.0)
Alkaline Phosphatase: 80 U/L (ref 38–126)
Anion gap: 11 (ref 5–15)
BUN: 62 mg/dL — ABNORMAL HIGH (ref 8–23)
CO2: 25 mmol/L (ref 22–32)
Calcium: 8.5 mg/dL — ABNORMAL LOW (ref 8.9–10.3)
Chloride: 102 mmol/L (ref 98–111)
Creatinine, Ser: 9.03 mg/dL — ABNORMAL HIGH (ref 0.61–1.24)
GFR calc Af Amer: 7 mL/min — ABNORMAL LOW (ref >60)
GFR calc non Af Amer: 6 mL/min — ABNORMAL LOW (ref >60)
Glucose, Bld: 177 mg/dL — ABNORMAL HIGH (ref 70–99)
Potassium: 4.2 mmol/L (ref 3.5–5.1)
Sodium: 138 mmol/L (ref 135–145)
Total Bilirubin: 0.7 mg/dL (ref 0.3–1.2)
Total Protein: 5.9 g/dL — ABNORMAL LOW (ref 6.5–8.1)

## 2020-04-24 LAB — CBC
HCT: 25.8 % — ABNORMAL LOW (ref 39.0–52.0)
Hemoglobin: 8.3 g/dL — ABNORMAL LOW (ref 13.0–17.0)
MCH: 29.6 pg (ref 26.0–34.0)
MCHC: 32.2 g/dL (ref 30.0–36.0)
MCV: 92.1 fL (ref 80.0–100.0)
Platelets: 221 10*3/uL (ref 150–400)
RBC: 2.8 MIL/uL — ABNORMAL LOW (ref 4.22–5.81)
RDW: 14.4 % (ref 11.5–15.5)
WBC: 7.4 10*3/uL (ref 4.0–10.5)
nRBC: 0 % (ref 0.0–0.2)

## 2020-04-24 LAB — HIV GENOSURE(R) MG

## 2020-04-24 LAB — GLUCOSE, CAPILLARY
Glucose-Capillary: 266 mg/dL — ABNORMAL HIGH (ref 70–99)
Glucose-Capillary: 170 mg/dL — ABNORMAL HIGH (ref 70–99)
Glucose-Capillary: 83 mg/dL (ref 70–99)
Glucose-Capillary: 89 mg/dL (ref 70–99)
Glucose-Capillary: 78 mg/dL (ref 70–99)
Glucose-Capillary: 268 mg/dL — ABNORMAL HIGH (ref 70–99)

## 2020-04-24 LAB — REFLEX TO GENOSURE(R) MG EDI: HIV GenoSure(R): 1

## 2020-04-24 LAB — HIV-1 RNA, PCR (GRAPH) RFX/GENO EDI
HIV-1 RNA BY PCR: 426000 copies/mL
HIV-1 RNA Quant, Log: 5.629 log10copy/mL

## 2020-04-24 MED ORDER — CEFAZOLIN SODIUM-DEXTROSE 2-4 GM/100ML-% IV SOLN
2.0000 g | INTRAVENOUS | Status: AC
  Administered 2020-04-25: 2 g via INTRAVENOUS
  Filled 2020-04-24 (×2): qty 100

## 2020-04-24 MED ORDER — HEPARIN SODIUM (PORCINE) 1000 UNIT/ML IJ SOLN
INTRAMUSCULAR | Status: AC
  Filled 2020-04-24: qty 4

## 2020-04-24 NOTE — Anesthesia Preprocedure Evaluation (Addendum)
Anesthesia Evaluation  Patient identified by MRN, date of birth, ID band Patient confused    Reviewed: Allergy & Precautions, NPO status , Patient's Chart, lab work & pertinent test results, Unable to perform ROS - Chart review only  Airway Mallampati: II  TM Distance: >3 FB Neck ROM: Full    Dental  (+) Missing, Dental Advisory Given, Poor Dentition   Pulmonary neg pulmonary ROS, Current Smoker,    Pulmonary exam normal breath sounds clear to auscultation       Cardiovascular hypertension, Pt. on home beta blockers and Pt. on medications Normal cardiovascular exam Rhythm:Regular Rate:Normal  Echo  1. Left ventricular ejection fraction, by estimation, is 55%. The left ventricle has normal function. The left ventricle has no regional wall motion abnormalities. There is moderate left ventricular hypertrophy. Left ventricular diastolic parameters are consistent with Grade I diastolic dysfunction (impaired relaxation).  2. Right ventricular systolic function is normal. The right ventricular size is normal. Tricuspid regurgitation signal is inadequate for assessing PA pressure.  3. Left atrial size was mildly dilated.  4. The mitral valve is normal in structure. No evidence of mitral valve regurgitation. No evidence of mitral stenosis.  5. The aortic valve is tricuspid. Aortic valve regurgitation is not visualized. No aortic stenosis is present.  6. The inferior vena cava is normal in size with greater than 50% respiratory variability, suggesting right atrial pressure of 3 mmHg.    Neuro/Psych  Headaches, Seizures -,  CVA, Residual Symptoms negative psych ROS   GI/Hepatic Neg liver ROS, GERD  ,  Endo/Other  diabetes  Renal/GU Renal disease     Musculoskeletal  (+) Arthritis ,   Abdominal   Peds  Hematology  (+) HIV,   Anesthesia Other Findings   Reproductive/Obstetrics                             Anesthesia Physical Anesthesia Plan  ASA: IV  Anesthesia Plan: MAC   Post-op Pain Management:    Induction: Intravenous  PONV Risk Score and Plan: 1 and Ondansetron, Propofol infusion and Treatment may vary due to age or medical condition  Airway Management Planned: Natural Airway  Additional Equipment: None  Intra-op Plan:   Post-operative Plan:   Informed Consent: I have reviewed the patients History and Physical, chart, labs and discussed the procedure including the risks, benefits and alternatives for the proposed anesthesia with the patient or authorized representative who has indicated his/her understanding and acceptance.     Dental advisory given and Consent reviewed with POA  Plan Discussed with: CRNA  Anesthesia Plan Comments:       Anesthesia Quick Evaluation

## 2020-04-24 NOTE — Progress Notes (Signed)
Hemodialysis- Tolerated well. Treatment completed without issue. UF 2.3L. Reported off to primary RN Sonya.

## 2020-04-24 NOTE — Progress Notes (Signed)
OT Cancellation Note  Patient Details Name: Richard [name] MRN: [id number] DOB: 1956-04-20   Cancelled Treatment:    Reason Eval/Treat Not Completed: Patient at procedure or test/ unavailable. Pt at HD - Will follow up when able.   Edouard Gikas/OTS Brigit Doke 04/24/2020, 8:15 AM

## 2020-04-24 NOTE — Progress Notes (Signed)
Subjective:  Apparently his dialysis got bumped to this AM- seen on HD  -  There is a bed available at [facility]  Objective Vital signs in last 24 hours: Vitals:   04/23/20 1623 04/23/20 1938 04/23/20 2320 04/24/20 0330  BP: 136/75 (!) 154/75 (!) 151/83 (!) 144/79  Pulse: 89 90 91 95  Resp: 16 18 18 18   Temp: 98.7 F (37.1 C) 98.7 F (37.1 C) 98.2 F (36.8 C) 98 F (36.7 C)  TempSrc: Oral Oral Oral Oral  SpO2: 100% 100% 100% 100%  Weight:    72.4 kg  Height:       Weight change: 3.1 kg  Intake/Output Summary (Last 24 hours) at 04/24/2020 0715 Last data filed at 04/23/2020 2320 Gross per 24 hour  Intake 840 ml  Output 300 ml  Net 540 ml    Assessment/ Plan: Pt is a 64 y.o. yo male with HIV/SZ/advanced CKD as well as noncompliance who was admitted on 04/10/2020 with  sz but also found to progress to ESRD and has had dialysis initiated this hosp  Assessment/Plan: 1. sz-  Per primary team-  Anti sz meds as he will consent 2. ESRD- new diagnosis this hospital admit.  Have initiated HD-  On MWF schedule via [facility]-  Felt to be not surgically stable for an AVF/AVG- will revisit this today with [facility] as he is close to discharge.  CLIP in process-  Social barriers, will need SNF at discharge.  Now his HD has been bumped from yesterday-  Will continue on TTS schedule now-  For next HD will need to be in chair-  Hopefully if he has his access and can dialyze in chair on Thursday will be ready for discharge 3. Anemia- iron repletion in progress-  Also ESA-   inc dose for hgb in the 8's  4. Secondary hyperparathyroidism-  PTH 160- calc /phos WNL- on no meds  5. HTN/volume-  Overall above goal but better-  Refusing meds-  On reg of norvasc 10, coreg 3.125 (may inc), then hydralazine and labetalol PRN 6. HIV-  [place]-  Also a skin biopsy pending for rash felt to be ID related  7. FTT-  Albumin under 3 and will need SNF placement at discharge   [place]: Basic  Metabolic Panel: Recent Labs  Lab 04/18/20 0343 04/18/20 0343 04/20/20 7741 04/21/20 0258 04/21/20 2106 04/21/20 2106 04/22/20 0222 04/23/20 0130 04/24/20 0346  NA 137   < > 137   < > 137   < > 136 136 138  K 3.6   < > 3.4*   < > 4.3   < > 4.6 4.1 4.2  CL 100   < > 99   < > 100   < > 100 101 102  CO2 26   < > 27   < > 25   < > 23 25 25   GLUCOSE 132*   < > 120*   < > 139*   < > 94 152* 177*  BUN 55*   < > 38*   < > 31*   < > 36* 50* 62*  CREATININE 5.79*   < > 5.77*   < > 5.63*   < > 6.20* 7.80* 9.03*  CALCIUM 7.9*   < > 8.4*   < > 8.3*   < > 8.3* 8.3* 8.5*  PHOS 2.8  --  3.9  --  2.7  --   --   --   --    < > =  values in this interval not displayed.   Liver Function Tests: Recent Labs  Lab 04/22/20 0222 04/23/20 0130 04/24/20 0346  AST 23 20 15   ALT 15 14 12   ALKPHOS 82 81 80  BILITOT 0.2* 0.7 0.7  PROT 6.0* 5.8* 5.9*  ALBUMIN 2.5* 2.5* 2.4*   No results for input(s): LIPASE, AMYLASE in the last 168 hours. No results for input(s): AMMONIA in the last 168 hours. CBC: Recent Labs  Lab 04/21/20 0258 04/21/20 0258 04/21/20 2106 04/21/20 2106 04/22/20 0222 04/23/20 0130 04/24/20 0346  WBC 5.2   < > 6.0   < > 6.7 7.0 7.4  HGB 9.0*   < > 9.2*   < > 8.7* 8.6* 8.3*  HCT 28.5*   < > 29.0*   < > 26.4* 26.2* 25.8*  MCV 91.9  --  92.1  --  88.6 91.6 92.1  PLT 139*   < > 146*   < > 131* 201 221   < > = values in this interval not displayed.   Cardiac Enzymes: No results for input(s): CKTOTAL, CKMB, CKMBINDEX, TROPONINI in the last 168 hours. CBG: Recent Labs  Lab 04/23/20 0007 04/23/20 0358 04/23/20 0750 04/23/20 1135 04/23/20 1537  GLUCAP 121* 193* 95 254* 101*    Iron Studies: No results for input(s): IRON, TIBC, TRANSFERRIN, FERRITIN in the last 72 hours. Studies/Results: No results found. Medications: Infusions: . sodium chloride    . sodium chloride    . sodium chloride    . sodium chloride    . ferric gluconate (FERRLECIT/NULECIT) IV Stopped  (04/20/20 1419)    Scheduled Medications: . amLODipine  10 mg Oral Daily  . carvedilol  3.125 mg Oral BID WC  . Chlorhexidine Gluconate Cloth  6 each Topical Q0600  . clopidogrel  75 mg Oral Daily  . [START ON 04/25/2020] darbepoetin (ARANESP) injection - DIALYSIS  150 mcg Intravenous Q Wed-HD  . dolutegravir  50 mg Oral Daily  . feeding supplement (NEPRO CARB STEADY)  237 mL Oral TID BM  . heparin injection (subcutaneous)  5,000 Units Subcutaneous Q8H  . insulin aspart  0-9 Units Subcutaneous TID WC  . lamiVUDine  300 mg Oral Daily  . levETIRAcetam  500 mg Oral Q12H  . lidocaine-EPINEPHrine  20 mL Intradermal Once  . LORazepam  2 mg Intravenous Once  . pantoprazole  40 mg Oral Daily  . pravastatin  80 mg Oral QHS  . sulfamethoxazole-trimethoprim  1 tablet Oral Once per day on Mon Wed Fri  . tamsulosin  0.4 mg Oral Daily    have reviewed scheduled and prn medications.  Physical Exam: General: agreeable-  One word answers Heart: tachy Lungs: mostly clear Abdomen: soft, non tender Extremities: no edema-  Diffuse rash Dialysis Access: right sided TDC     04/24/2020,7:15 AM  LOS: 14 days

## 2020-04-24 NOTE — Procedures (Signed)
Patient was seen on dialysis and the procedure was supervised.  BFR 400  Via TDC BP is  124/76.   Patient appears to be tolerating treatment well  Richard [name] 04/24/2020

## 2020-04-24 NOTE — Progress Notes (Signed)
Subjective:  No new complaints Antibiotics:  Anti-infectives (From admission, onward)   Start     Dose/Rate Route Frequency Ordered Stop   04/25/20 0000  ceFAZolin (ANCEF) IVPB 2g/100 mL premix     Discontinue    Note to Pharmacy: Send with pt to OR   2 g 200 mL/hr over 30 Minutes Intravenous On call 04/24/20 1021 04/26/20 0000   04/23/20 0000  sulfamethoxazole-trimethoprim (BACTRIM DS) 800-160 MG tablet     Discontinue     1 tablet Oral 3 times weekly 04/23/20 0918     04/19/20 1000  lamiVUDine (EPIVIR) tablet 300 mg     Discontinue    Note to Pharmacy: I DO NOT renally dose lamivudine and I have never had problems NOT adjusting it. I want pt to be on Dovato but still not on formulary here (unfortunately)   300 mg Oral Daily 04/18/20 1043     04/17/20 0000  Dolutegravir-lamiVUDine (DOVATO) 50-300 MG TABS     Discontinue     1 tablet Oral Daily 04/17/20 1546     04/16/20 1000  lamiVUDine (EPIVIR) tablet 300 mg  Status:  Discontinued       Note to Pharmacy: I DO NOT renally dose lamivudine and I have never had problems NOT adjusting it. I want pt to be on Dovato but still not on formulary here (unfortunately)   300 mg Per Tube Daily 04/15/20 1307 04/18/20 1043   04/16/20 0900  sulfamethoxazole-trimethoprim (BACTRIM DS) 800-160 MG per tablet 1 tablet     Discontinue     1 tablet Oral Once per day on Mon Wed Fri 04/15/20 1350     04/15/20 1245  dolutegravir (TIVICAY) tablet 50 mg     Discontinue     50 mg Oral Daily 04/15/20 1202     04/15/20 1245  lamiVUDine (EPIVIR) tablet 300 mg  Status:  Discontinued       Note to Pharmacy: I DO NOT renally dose lamivudine and I have never had problems NOT adjusting it. I want pt to be on Dovato but still not on formulary here (unfortunately)   300 mg Oral Daily 04/15/20 1202 04/15/20 1307   04/13/20 1737  ceFAZolin (ANCEF) 2-4 GM/100ML-% IVPB       Note to Pharmacy: Desiree Hane   : cabinet override      04/13/20 1737 04/14/20 0544    04/13/20 0000  ceFAZolin (ANCEF) IVPB 2g/100 mL premix        2 g 200 mL/hr over 30 Minutes Intravenous To Radiology 04/12/20 1454 04/13/20 0834      Medications: Scheduled Meds: . amLODipine  10 mg Oral Daily  . carvedilol  3.125 mg Oral BID WC  . Chlorhexidine Gluconate Cloth  6 each Topical Q0600  . clopidogrel  75 mg Oral Daily  . [START ON 04/25/2020] darbepoetin (ARANESP) injection - DIALYSIS  150 mcg Intravenous Q Wed-HD  . dolutegravir  50 mg Oral Daily  . feeding supplement (NEPRO CARB STEADY)  237 mL Oral TID BM  . heparin injection (subcutaneous)  5,000 Units Subcutaneous Q8H  . heparin sodium (porcine)      . insulin aspart  0-9 Units Subcutaneous TID WC  . lamiVUDine  300 mg Oral Daily  . levETIRAcetam  500 mg Oral Q12H  . lidocaine-EPINEPHrine  20 mL Intradermal Once  . LORazepam  2 mg Intravenous Once  . pantoprazole  40 mg Oral Daily  . pravastatin  80 mg  Oral QHS  . sulfamethoxazole-trimethoprim  1 tablet Oral Once per day on Mon Wed Fri  . tamsulosin  0.4 mg Oral Daily   Continuous Infusions: . [START ON 04/25/2020]  ceFAZolin (ANCEF) IV    . ferric gluconate (FERRLECIT/NULECIT) IV Stopped (04/20/20 1419)   PRN Meds:.hydrALAZINE, labetalol    Objective: Weight change: 3.1 kg  Intake/Output Summary (Last 24 hours) at 04/24/2020 1317 Last data filed at 04/24/2020 1044 Gross per 24 hour  Intake 840 ml  Output 2681 ml  Net -1841 ml   Blood pressure (!) 159/91, pulse 89, temperature 98.6 F (37 C), temperature source Oral, resp. rate 16, height 6' (1.829 m), weight 70 kg, SpO2 100 %. Temp:  [98 F (36.7 C)-98.7 F (37.1 C)] 98.6 F (37 C) (08/03 1207) Pulse Rate:  [87-105] 89 (08/03 1207) Resp:  [15-18] 16 (08/03 1207) BP: (107-159)/(63-95) 159/91 (08/03 1207) SpO2:  [100 %] 100 % (08/03 1207) Weight:  [70 kg-72.5 kg] 70 kg (08/03 1044)  Physical Exam: General: alert and oriented to being in the hospital HEENT: anicteric sclera, EOMI CVS tachycardic no  murmurs gallops rubs l  Chest: , no wheezing, no respiratory distress lungs clear Abdomen: soft non-distended,  Extremities: Right upper extremity with contracture Skin: Diffuse rash that is concerning for possible disseminated cryptococcal infection Neuro: Right upper extremity with contracture but able to move his hands also wiggle toes  CBC:    BMET Recent Labs    04/23/20 0130 04/24/20 0346  NA 136 138  K 4.1 4.2  CL 101 102  CO2 25 25  GLUCOSE 152* 177*  BUN 50* 62*  CREATININE 7.80* 9.03*  CALCIUM 8.3* 8.5*     Liver Panel  Recent Labs    04/23/20 0130 04/24/20 0346  PROT 5.8* 5.9*  ALBUMIN 2.5* 2.4*  AST 20 15  ALT 14 12  ALKPHOS 81 80  BILITOT 0.7 0.7       Sedimentation Rate No results for input(s): ESRSEDRATE in the last 72 hours. C-Reactive Protein No results for input(s): CRP in the last 72 hours.  Micro Results: Recent Results (from the past 720 hour(s))  SARS Coronavirus 2 by RT PCR (hospital order, performed in [facility] hospital lab) Nasopharyngeal Nasopharyngeal Swab     Status: None   Collection Time: 04/11/20  3:58 AM   Specimen: Nasopharyngeal Swab  Result Value Ref Range Status   SARS Coronavirus 2 NEGATIVE NEGATIVE Final    Comment: (NOTE) SARS-CoV-2 target nucleic acids are NOT DETECTED.  The SARS-CoV-2 RNA is generally detectable in upper and lower respiratory specimens during the acute phase of infection. The lowest concentration of SARS-CoV-2 viral copies this assay can detect is 250 copies / mL. A negative result does not preclude SARS-CoV-2 infection and should not be used as the sole basis for treatment or other patient management decisions.  A negative result may occur with improper specimen collection / handling, submission of specimen other than nasopharyngeal swab, presence of viral mutation(s) within the areas targeted by this assay, and inadequate number of viral copies (<250 copies / mL). A negative result must  be combined with clinical observations, patient history, and epidemiological information.  Fact Sheet for Patients:   StrictlyIdeas.no  Fact Sheet for Healthcare Providers: BankingDealers.co.za  This test is not yet approved or  cleared by the [place] FDA and has been authorized for detection and/or diagnosis of SARS-CoV-2 by FDA under an Emergency Use Authorization (EUA).  This EUA will remain in effect (meaning  this test can be used) for the duration of the COVID-19 declaration under Section 564(b)(1) of the Act, 21 U.S.C. section 360bbb-3(b)(1), unless the authorization is terminated or revoked sooner.  Performed at [facility], [place] [address], [place], [place] [address]   Culture, blood (routine x 2)     Status: None   Collection Time: 04/14/20 11:31 AM   Specimen: BLOOD RIGHT HAND  Result Value Ref Range Status   Specimen Description BLOOD RIGHT HAND  Final   Special Requests   Final    BOTTLES DRAWN AEROBIC ONLY Blood Culture results may not be optimal due to an inadequate volume of blood received in culture bottles   Culture   Final    NO GROWTH 5 DAYS Performed at [facility], [place] [address] [place], [place] [address]    Report Status 04/19/2020 FINAL  Final  Culture, blood (routine x 2)     Status: None   Collection Time: 04/14/20 11:36 AM   Specimen: BLOOD RIGHT HAND  Result Value Ref Range Status   Specimen Description BLOOD RIGHT HAND  Final   Special Requests   Final    BOTTLES DRAWN AEROBIC ONLY Blood Culture results may not be optimal due to an inadequate volume of blood received in culture bottles   Culture   Final    NO GROWTH 5 DAYS Performed at [facility], [place] [address] [place], [place] [address]    Report Status 04/19/2020 FINAL  Final  CSF culture     Status: None   Collection Time: 04/14/20  6:27 PM   Specimen: PATH Cytology CSF; Cerebrospinal Fluid  Result Value Ref Range  Status   Specimen Description CSF  Final   Special Requests NONE  Final   Gram Stain NO WBC SEEN NO ORGANISMS SEEN CYTOSPIN SMEAR   Final   Culture   Final    NO GROWTH 3 DAYS Performed at [facility], [address], [place], [place] [address]    Report Status 04/18/2020 FINAL  Final  Fungus Stain     Status: None   Collection Time: 04/14/20  6:27 PM   Specimen: PATH Cytology CSF; Cerebrospinal Fluid  Result Value Ref Range Status   FUNGUS STAIN Final report  Final    Comment: (NOTE) Performed At: [facility] [address] [place], [place] [id number] Rush Farmer MD [id number]    Fungal Source CSF  Final    Comment: Performed at [facility], [place] [address], [place], [place] [address]  Fungal Stain reflex     Status: None   Collection Time: 04/14/20  6:27 PM  Result Value Ref Range Status   Fungal stain result 1 Comment  Final    Comment: (NOTE) KOH/Calcofluor preparation:  no fungus observed. Performed At: [facility] [place], [place] [id number] Rush Farmer MD [id number]   Acid Fast Smear (AFB)     Status: None   Collection Time: 04/20/20  2:08 PM   Specimen: Skin Biopsy  Result Value Ref Range Status   AFB Specimen Processing Comment  Final    Comment: Tissue Grinding and Digestion/Decontamination   Acid Fast Smear Negative  Final    Comment: (NOTE) Performed At: [facility] [place], [place] [id number] Rush Farmer MD [id number]    Source (AFB) SKIN  Final    Comment: BIOPSY Performed at [facility], [place] [address] [place], [place] [address]  Studies/Results: No results found.    Assessment/Plan:  INTERVAL HISTORY:  Biopsy results still pending  Principal Problem:   Encephalopathy Active Problems:   Tobacco abuse   Malnutrition of moderate degree (HCC)   Hyperlipidemia   Neuropathy in diabetes (HCC)   GERD (gastroesophageal reflux disease)    Diabetes mellitus, type II, insulin dependent (HCC)   Altered mental status   CKD (chronic kidney disease) stage 5, GFR less than 15 ml/min (HCC)   History of cerebrovascular accident (CVA) with residual deficit   Seizure ([place])   AIDS (acquired immune deficiency syndrome) ([place])   ESRD (end stage renal disease) ([place])   Creatinine elevation   Goals of care, counseling/discussion   Hypertensive urgency   Meningoencephalitis   Palliative care by specialist   Weakness    Richard [name] is a 64 y.o. male with past medical history significant for poorly controlled diabetes and hypertension with progression of his chronic kidney disease, prior stroke who was admitted with seizures and encephalopathy and found to have a parietal infarct.  He also has been found to have HIV and AIDS with a CD4 count of 39.  He has a diffuse rash that is concerning for possible disseminated cryptococcal infection versus histoplasma Blastomyces  #1  Central nervous system infarct with encephalopathy: I was concerned that he could have disseminated cryptococcal infection cryptococcal meningitis.    Fortunately had a normal opening pressure and a negative cryptococcal antigen in CSF.  His CSF profile is also normal although it can be normal in people with HIV AIDS and cryptococcal meningitis.  He does not appear to have any evidence of CNS infection which is a relief    2.  HIV and AIDS: continue components __> DOVATO daily at  DC along with TMP/SMX TIW for PCP prevention   Note his tube feeds will need to be held before and after getting his antivirals   3.  Chronic kidney disease that is progressed to end-stage renal disease.  He is on HD    4 skin rash: It has appearance consistent with that of a dimorphic fungus report is pending if the smear is negative fungal smear pending       LOS: 14 days   Alcide Evener 04/24/2020, 1:17 PM

## 2020-04-24 NOTE — Progress Notes (Addendum)
[facility]  VASCULAR SURGERY ASSESSMENT & PLAN:   END-STAGE RENAL DISEASE: We have been asked to proceed with placement of long-term hemodialysis access.  Based on his vein map and exam he appears to be a candidate for a left brachiocephalic fistula.  He is currently on dialysis and we will schedule this for tomorrow.  I have discussed the indications and potential complications with the patient who seems to understand and is agreeable to proceed.  I have also tried to call his daughter and left a message.  Preop orders have been written.  Deitra Mayo, MD Office: [phone]      Newly diagnosed HIV/AIDS on Bactrim for OI prophylaxis. We have been asked to reconsider permanant access.  Currently on HD via [facility].  No current infection.     Objective 123/63 88 98.2 F (36.8 C) (Oral) 16 100%  Intake/Output Summary (Last 24 hours) at 04/24/2020 0942 Last data filed at 04/23/2020 2320 Gross per 24 hour  Intake 840 ml  Output 300 ml  Net 540 ml     +-----------------+-------------+----------+---------+  Left Cephalic  Diameter (cm)Depth (cm)Findings   +-----------------+-------------+----------+---------+  Shoulder       0.43     0.64         +-----------------+-------------+----------+---------+  Prox upper arm    0.36     0.42         +-----------------+-------------+----------+---------+  Mid upper arm    0.36     0.40         +-----------------+-------------+----------+---------+  Dist upper arm    0.40     0.33  branching  +-----------------+-------------+----------+---------+  Antecubital fossa  0.48     0.80         +-----------------+-------------+----------+---------+  Prox forearm     0.20     0.48  branching  +-----------------+-------------+----------+---------+  Mid forearm     0.11     0.39          +-----------------+-------------+----------+---------+  Dist forearm     0.14     0.24         +-----------------+-------------+----------+---------+   +-----------------+-------------+----------+--------+  Left Basilic   Diameter (cm)Depth (cm)Findings  +-----------------+-------------+----------+--------+  Prox upper arm    0.43     0.77        +-----------------+-------------+----------+--------+  Mid upper arm    0.33     0.79        +-----------------+-------------+----------+--------+  Dist upper arm    0.29     0.72        +-----------------+-------------+----------+--------+  Antecubital fossa  0.34     0.68        +-----------------+-------------+----------+--------+  Prox forearm     0.29     0.19        +-----------------+-------------+----------+--------+  Mid forearm     0.27     0.27        +-----------------+-------------+----------+--------+  Distal forearm    0.28     0.32        +-----------------+-------------+----------+--------+  Wrist        0.16     0.32        +-----------------+-------------+----------+--------+   Left UE palpable radial pulse grip and sensation intact.  Assessment/Planning: ESRD on HD left [facility]   He has an acceptable left cephalic vein as well as basilic.  We will plan Left UE AV fistula creation.  Date to be determined by Dr. Scot Dock.  Patient agrees with plan.   Roxy Horseman  04/24/2020 9:42 AM --  Laboratory Lab Results: Recent Labs    04/23/20 0130 04/24/20 0346  WBC 7.0 7.4  HGB 8.6* 8.3*  HCT 26.2* 25.8*  PLT 201 221   BMET Recent Labs    04/23/20 0130 04/24/20 0346  NA 136 138  K 4.1 4.2  CL 101 102  CO2 25 25  GLUCOSE 152* 177*  BUN 50* 62*  CREATININE 7.80* 9.03*  CALCIUM 8.3* 8.5*    COAG Lab Results  Component Value Date   INR 1.1  04/13/2020   INR 1.0 02/13/2020   INR 0.89 08/23/2012   No results found for: PTT

## 2020-04-24 NOTE — H&P (View-Only) (Signed)
[facility]  VASCULAR SURGERY ASSESSMENT & PLAN:   END-STAGE RENAL DISEASE: We have been asked to proceed with placement of long-term hemodialysis access.  Based on his vein map and exam he appears to be a candidate for a left brachiocephalic fistula.  He is currently on dialysis and we will schedule this for tomorrow.  I have discussed the indications and potential complications with the patient who seems to understand and is agreeable to proceed.  I have also tried to call his daughter and left a message.  Preop orders have been written.  Deitra Mayo, MD Office: [phone]      Newly diagnosed HIV/AIDS on Bactrim for OI prophylaxis. We have been asked to reconsider permanant access.  Currently on HD via [facility].  No current infection.     Objective 123/63 88 98.2 F (36.8 C) (Oral) 16 100%  Intake/Output Summary (Last 24 hours) at 04/24/2020 0942 Last data filed at 04/23/2020 2320 Gross per 24 hour  Intake 840 ml  Output 300 ml  Net 540 ml     +-----------------+-------------+----------+---------+  Left Cephalic  Diameter (cm)Depth (cm)Findings   +-----------------+-------------+----------+---------+  Shoulder       0.43     0.64         +-----------------+-------------+----------+---------+  Prox upper arm    0.36     0.42         +-----------------+-------------+----------+---------+  Mid upper arm    0.36     0.40         +-----------------+-------------+----------+---------+  Dist upper arm    0.40     0.33  branching  +-----------------+-------------+----------+---------+  Antecubital fossa  0.48     0.80         +-----------------+-------------+----------+---------+  Prox forearm     0.20     0.48  branching  +-----------------+-------------+----------+---------+  Mid forearm     0.11     0.39          +-----------------+-------------+----------+---------+  Dist forearm     0.14     0.24         +-----------------+-------------+----------+---------+   +-----------------+-------------+----------+--------+  Left Basilic   Diameter (cm)Depth (cm)Findings  +-----------------+-------------+----------+--------+  Prox upper arm    0.43     0.77        +-----------------+-------------+----------+--------+  Mid upper arm    0.33     0.79        +-----------------+-------------+----------+--------+  Dist upper arm    0.29     0.72        +-----------------+-------------+----------+--------+  Antecubital fossa  0.34     0.68        +-----------------+-------------+----------+--------+  Prox forearm     0.29     0.19        +-----------------+-------------+----------+--------+  Mid forearm     0.27     0.27        +-----------------+-------------+----------+--------+  Distal forearm    0.28     0.32        +-----------------+-------------+----------+--------+  Wrist        0.16     0.32        +-----------------+-------------+----------+--------+   Left UE palpable radial pulse grip and sensation intact.  Assessment/Planning: ESRD on HD left [name]   He has an acceptable left cephalic vein as well as basilic.  We will plan Left UE AV fistula creation.  Date to be determined by Dr. Scot Dock.  Patient agrees with plan.   Roxy Horseman  04/24/2020 9:42 AM --  Laboratory Lab Results: Recent Labs    04/23/20 0130 04/24/20 0346  WBC 7.0 7.4  HGB 8.6* 8.3*  HCT 26.2* 25.8*  PLT 201 221   BMET Recent Labs    04/23/20 0130 04/24/20 0346  NA 136 138  K 4.1 4.2  CL 101 102  CO2 25 25  GLUCOSE 152* 177*  BUN 50* 62*  CREATININE 7.80* 9.03*  CALCIUM 8.3* 8.5*    COAG Lab Results  Component Value Date   INR 1.1  04/13/2020   INR 1.0 02/13/2020   INR 0.89 08/23/2012   No results found for: PTT

## 2020-04-24 NOTE — Progress Notes (Signed)
Renal Navigator received call from CSW/L. Paisley yesterday regarding SNF bed offer at [facility]. Navigator has referred patient to [facility] to request treatment for ESRD at [place] if this clinic is able to accommodate. [facility] is closest to [facility] and patient's home.  Patient for permanent access surgery tomorrow and patient needs HD in recliner prior to discharge readiness. Navigator will follow closely.   Alphonzo Cruise,  Renal Navigator [phone]

## 2020-04-24 NOTE — Progress Notes (Signed)
PROGRESS NOTE    Richard [name]  SNK:[id number] DOB: 16-Jul-1956 DOA: 04/10/2020 PCP: [name], MD   Brief Narrative:  Richard [name]  is a 64 y.o. male, with medical history significant ofdiabetes, osteoarthritis, hypertension,CKD 5,neuropathies, previous CVA with residual right-sided weakness, migraine headaches , patient with extremely poor compliance, he has refused hemodialysis access in the past, presents to ED via EMS status post seizures, he is known history of seizures, but has not been noncompliant with his seizure medications, seizures was witnessed by his roommate, seizures lasted for 4 minutes, she will patient's extremely encephalopathic, unable to provide any history, but upon my evaluation is more appropriate, patient report actually he never got his seizure medications, was not taking it, patient denies any fever, chest pain, shortness of breath, no dysuria or polyuria, reports usually ambulates with a walker, he has right-sided weakness at baseline.  Interim history: Admitted for AMS, found to have CVA and seizure. Neuro was consulted. Currently on AED. Vascular consulted for access placement as patient recently started HD for ESRD. Nephro continues to follow for dialysis.  NG tube removed on the 28th - continue to attempt to advance diet, once tolerating p.o. adequately will discharge to SNF pending placement availability and approval. Continues to refuse medications on occasion, continued work on compliance/eduction daily as his mental status improves.  Continues to be somewhat difficult placement given hemodialysis slot needed, case management and social worker continue to work with family on placement at this time.  Assessment & Plan  Principal Problem:   Encephalopathy Active Problems:   Tobacco abuse   Malnutrition of moderate degree (HCC)   Hyperlipidemia   Neuropathy in diabetes (HCC)   GERD (gastroesophageal reflux disease)   Diabetes mellitus,  type II, insulin dependent (HCC)   Altered mental status   CKD (chronic kidney disease) stage 5, GFR less than 15 ml/min (HCC)   History of cerebrovascular accident (CVA) with residual deficit   Seizure ([place])   AIDS (acquired immune deficiency syndrome) (HCC)   ESRD (end stage renal disease) ([place])   Creatinine elevation   Goals of care, counseling/discussion   Hypertensive urgency   Meningoencephalitis   Palliative care by specialist   Weakness  Acute metabolic encephalopathy, multifactorial, POA, improving slowly - Likely multifactorial including seizure, hypertensive emergency, acute stroke, uremia - Patient continues to be somewhat delayed with answering questions but does so appropriately, somnolence appears to be improving drastically over the past few days.  Much more awake alert and oriented this morning after dialysis.  Acute CVA, POA, stable - Also with history of CVA with residual right-sided weakness and chronic right upper extremity contracture - Initial CT scan on admission no acute findings - Due to acute mental status change on the morning of July 22, code stroke initiated, stat CT scan no acute findings, stat MRI showed "2 mm acute or Richard subacute infarct within the left parietal lobe periatrial white matter" - HIV screening positive - Status post Lumbar puncture on 7/24, CSF Gram stain/culture negative. Cryptococcus PCR negative. VDRL CSF is negative. - PT, OT recommending SNF - Core track removed, now tolerating p.o. per speech continue to advance diet as tolerated - Neurology consulted and appreciate insight and recommendations - Continue Plavix, statin  Failure to thrive, moderate caloric malnutrition, improving -P.o. intake drastically improving over the past 72 hours -Nutrition/dietary continue to follow, appreciate insight and recommendations, given advancement in diet and p.o. tolerance no longer following for possible PEG tube placement, family  updated.  Recurrent seizures in the setting of noncomlpiance, POA - He is known to be noncompliant with his seizure medication - EEG July 24 "This study is suggestive of moderate diffuse encephalopathy, nonspecific etiology.No seizures or epileptiform discharges were seen throughout the recording" - Status post Lumbar puncture on 7/24, CSF Gram stain negative. Cryptococcus PCR negative. VDRL CSF is pending. - Previously on IV Keppra and IV Vimpat , now on iv keppra only per neurology recommendation - Appreciate neurology recommendation  Hypertensive emergency, present on admission - Resolved - Currently well controlled on amlodipine, carvedilol along with dialysis -We will avoid strict control to avoid hypotension in the setting of recent stroke as above, blood pressure labile 160/92 - 130/60.  Chronic kidney disease, stage V now progressing to ESRD  - Creatinine in 10-11 range, creatinine appears to be at baseline. - Patient has refused dialysis in the past, agreeable to dialysis on presentation - Nephrology consulted and appreciated - IR placed dialysis catheter, tolerating well - Currently tolerating hemodialysis well - started on 04/13/2020 - Vascular surgery consulted and appreciated for placement of access  Anemia of chronic disease - Hemoglobin stable, continue to monitor CBC  HIV/AIDS, newly diagnosed  - CD4 count 39, viral load 624,000 - New diagnosis - Infectious disease consulted and appreciated-continue tivicay, epivir - Currently on bactrim  Dark skin lesions - Nontender, does not itch per patient - Molluscum contagiosum ? Kaposi sarcoma? - Surgical biopsy performed 04/20/20 x 2, awaiting path and culture  Diabetes mellitus, type II with episode of hypoglycemia -Controlled, hemoglobin A1c 5.2 (02/14/2020) - On Novolin 70/30 at home -he on sliding scale only with very little to no requirement for administration over the past 48 hours - Continue insulin sliding  scale, hypoglycemia protocol, AC at bedtime monitoring  Hyperlipidemia - Total cholesterol 147, HDL 31, LDL 91, triglycerides 125 - Continue pravastatin 80  Tobacco use - Cessation discussion ongoing  History of severe noncompliance - Per previous documentation, patient counseled -Patient continues to refuse certain p.o. medications today, Keppra was given x1 IV to ensure no lapse in seizure medication thresholds.  DVT Prophylaxis  Heparin Code Status: Full Family Communication: None at bedside Status is: Inpatient  Remains inpatient appropriate because:IV treatments appropriate due to intensity of illness or inability to take PO   Dispo: The patient is from: Home              Anticipated d/c is to: SNF              Anticipated d/c date is: 1 day              Patient currently is medically stable to d/c.  Once a hemodialysis slot and nursing home bed can be obtained, patient remains unsafe for discharge home given lack of support.   Consultants Nephrology Neurology Advanced radiology Infectious disease Palliative care General surgery  Procedures  Bedside lumbar puncture attempted July 24 by neurology Lumbar puncture by IR on July 24 Dialysis catheter placement by interventional radiology   Antibiotics   Anti-infectives (From admission, onward)   Start     Dose/Rate Route Frequency Ordered Stop   04/23/20 0000  sulfamethoxazole-trimethoprim (BACTRIM DS) 800-160 MG tablet     Discontinue     1 tablet Oral 3 times weekly 04/23/20 0918     04/19/20 1000  lamiVUDine (EPIVIR) tablet 300 mg     Discontinue    Note to Pharmacy: I DO NOT renally dose lamivudine and I have never had problems  NOT adjusting it. I want pt to be on Dovato but still not on formulary here (unfortunately)   300 mg Oral Daily 04/18/20 1043     04/17/20 0000  Dolutegravir-lamiVUDine (DOVATO) 50-300 MG TABS     Discontinue     1 tablet Oral Daily 04/17/20 1546     04/16/20 1000  lamiVUDine (EPIVIR)  tablet 300 mg  Status:  Discontinued       Note to Pharmacy: I DO NOT renally dose lamivudine and I have never had problems NOT adjusting it. I want pt to be on Dovato but still not on formulary here (unfortunately)   300 mg Per Tube Daily 04/15/20 1307 04/18/20 1043   04/16/20 0900  sulfamethoxazole-trimethoprim (BACTRIM DS) 800-160 MG per tablet 1 tablet     Discontinue     1 tablet Oral Once per day on Mon Wed Fri 04/15/20 1350     04/15/20 1245  dolutegravir (TIVICAY) tablet 50 mg     Discontinue     50 mg Oral Daily 04/15/20 1202     04/15/20 1245  lamiVUDine (EPIVIR) tablet 300 mg  Status:  Discontinued       Note to Pharmacy: I DO NOT renally dose lamivudine and I have never had problems NOT adjusting it. I want pt to be on Dovato but still not on formulary here (unfortunately)   300 mg Oral Daily 04/15/20 1202 04/15/20 1307   04/13/20 1737  ceFAZolin (ANCEF) 2-4 GM/100ML-% IVPB       Note to Pharmacy: Desiree Hane   : cabinet override      04/13/20 1737 04/14/20 0544   04/13/20 0000  ceFAZolin (ANCEF) IVPB 2g/100 mL premix        2 g 200 mL/hr over 30 Minutes Intravenous To Radiology 04/12/20 1454 04/13/20 0834      Subjective:   No acute issues or events overnight, tolerating p.o. much more appropriately this morning ate nearly 100% of his meal, denies nausea, vomiting, diarrhea, constipation, headache, fevers, chills.  Objective:   Vitals:   04/24/20 0710 04/24/20 0715 04/24/20 0730 04/24/20 0800  BP: (!) 152/95 (!) 147/81 118/66 107/80  Pulse: 98 92 97 (!) 105  Resp: 16  15 16   Temp: 98.2 F (36.8 C)     TempSrc: Oral     SpO2: 100%     Weight: 72.5 kg     Height:        Intake/Output Summary (Last 24 hours) at 04/24/2020 0828 Last data filed at 04/23/2020 2320 Gross per 24 hour  Intake 840 ml  Output 300 ml  Net 540 ml   Filed Weights   04/23/20 0500 04/24/20 0330 04/24/20 0710  Weight: 69.3 kg 72.4 kg 72.5 kg   Exam  General: Well developed, chronically  ill appearing, NAD  HEENT: NCAT,  mucous membranes moist  Cardiovascular: S1 S2 auscultated, RRR.  Respiratory: Clear to auscultation bilaterally   Abdomen: Soft, nontender, nondistended, + bowel sounds  Extremities: warm dry without cyanosis clubbing or edema, RUE contracture  Neuro: Somnolent, sluggish to answer questions but does so appropriately after given adequate time  Skin: Diffuse rash  Data Reviewed: I have personally reviewed following labs and imaging studies  CBC: Recent Labs  Lab 04/21/20 0258 04/21/20 2106 04/22/20 0222 04/23/20 0130 04/24/20 0346  WBC 5.2 6.0 6.7 7.0 7.4  HGB 9.0* 9.2* 8.7* 8.6* 8.3*  HCT 28.5* 29.0* 26.4* 26.2* 25.8*  MCV 91.9 92.1 88.6 91.6 92.1  PLT 139* 146* 131*  201 716   Basic Metabolic Panel: Recent Labs  Lab 04/18/20 0343 04/18/20 0343 04/20/20 0958 04/20/20 0958 04/21/20 0258 04/21/20 2106 04/22/20 0222 04/23/20 0130 04/24/20 0346  NA 137   < > 137   < > 138 137 136 136 138  K 3.6   < > 3.4*   < > 3.8 4.3 4.6 4.1 4.2  CL 100   < > 99   < > 100 100 100 101 102  CO2 26   < > 27   < > 28 25 23 25 25   GLUCOSE 132*   < > 120*   < > 126* 139* 94 152* 177*  BUN 55*   < > 38*   < > 20 31* 36* 50* 62*  CREATININE 5.79*   < > 5.77*   < > 4.36* 5.63* 6.20* 7.80* 9.03*  CALCIUM 7.9*   < > 8.4*   < > 8.3* 8.3* 8.3* 8.3* 8.5*  PHOS 2.8  --  3.9  --   --  2.7  --   --   --    < > = values in this interval not displayed.   GFR: Estimated Creatinine Clearance: 8.7 mL/min (A) (by C-G formula based on SCr of 9.03 mg/dL (H)). Liver Function Tests: Recent Labs  Lab 04/21/20 0258 04/21/20 2106 04/22/20 0222 04/23/20 0130 04/24/20 0346  AST 27  --  23 20 15   ALT 16  --  15 14 12   ALKPHOS 76  --  82 81 80  BILITOT 0.5  --  0.2* 0.7 0.7  PROT 6.2*  --  6.0* 5.8* 5.9*  ALBUMIN 2.4* 2.5* 2.5* 2.5* 2.4*   No results for input(s): LIPASE, AMYLASE in the last 168 hours. No results for input(s): AMMONIA in the last 168  hours. Coagulation Profile: No results for input(s): INR, PROTIME in the last 168 hours. Cardiac Enzymes: No results for input(s): CKTOTAL, CKMB, CKMBINDEX, TROPONINI in the last 168 hours. BNP (last 3 results) Recent Labs    06/22/19 1505  PROBNP 170.0*   HbA1C: No results for input(s): HGBA1C in the last 72 hours. CBG: Recent Labs  Lab 04/23/20 0007 04/23/20 0358 04/23/20 0750 04/23/20 1135 04/23/20 1537  GLUCAP 121* 193* 95 254* 101*   Lipid Profile: No results for input(s): CHOL, HDL, LDLCALC, TRIG, CHOLHDL, LDLDIRECT in the last 72 hours. Thyroid Function Tests: No results for input(s): TSH, T4TOTAL, FREET4, T3FREE, THYROIDAB in the last 72 hours. Anemia Panel: No results for input(s): VITAMINB12, FOLATE, FERRITIN, TIBC, IRON, RETICCTPCT in the last 72 hours. Urine analysis:    Component Value Date/Time   COLORURINE STRAW (A) 04/11/2020 0223   APPEARANCEUR CLEAR 04/11/2020 0223   LABSPEC 1.011 04/11/2020 0223   PHURINE 5.0 04/11/2020 0223   GLUCOSEU 50 (A) 04/11/2020 0223   HGBUR SMALL (A) 04/11/2020 0223   BILIRUBINUR NEGATIVE 04/11/2020 0223   KETONESUR NEGATIVE 04/11/2020 0223   PROTEINUR >=300 (A) 04/11/2020 0223   UROBILINOGEN 0.2 11/29/2014 2003   NITRITE NEGATIVE 04/11/2020 0223   LEUKOCYTESUR NEGATIVE 04/11/2020 0223    Recent Results (from the past 240 hour(s))  Culture, blood (routine x 2)     Status: None   Collection Time: 04/14/20 11:31 AM   Specimen: BLOOD RIGHT HAND  Result Value Ref Range Status   Specimen Description BLOOD RIGHT HAND  Final   Special Requests   Final    BOTTLES DRAWN AEROBIC ONLY Blood Culture results may not be optimal due to an inadequate  volume of blood received in culture bottles   Culture   Final    NO GROWTH 5 DAYS Performed at [facility], [place] [address], [place], [place] [address]    Report Status 04/19/2020 FINAL  Final  Culture, blood (routine x 2)     Status: None   Collection Time: 04/14/20 11:36 AM    Specimen: BLOOD RIGHT HAND  Result Value Ref Range Status   Specimen Description BLOOD RIGHT HAND  Final   Special Requests   Final    BOTTLES DRAWN AEROBIC ONLY Blood Culture results may not be optimal due to an inadequate volume of blood received in culture bottles   Culture   Final    NO GROWTH 5 DAYS Performed at [facility], [place] [address] [place], [place] [address]    Report Status 04/19/2020 FINAL  Final  CSF culture     Status: None   Collection Time: 04/14/20  6:27 PM   Specimen: PATH Cytology CSF; Cerebrospinal Fluid  Result Value Ref Range Status   Specimen Description CSF  Final   Special Requests NONE  Final   Gram Stain NO WBC SEEN NO ORGANISMS SEEN CYTOSPIN SMEAR   Final   Culture   Final    NO GROWTH 3 DAYS Performed at [facility], [address], [place], [place] [address]    Report Status 04/18/2020 FINAL  Final  Fungus Stain     Status: None   Collection Time: 04/14/20  6:27 PM   Specimen: PATH Cytology CSF; Cerebrospinal Fluid  Result Value Ref Range Status   FUNGUS STAIN Final report  Final    Comment: (NOTE) Performed At: [facility] [address] [place], [place] [id number] Rush Farmer MD PJ:[id number]    Fungal Source CSF  Final    Comment: Performed at [name], [place] [address], [place], [place] [address]  Fungal Stain reflex     Status: None   Collection Time: 04/14/20  6:27 PM  Result Value Ref Range Status   Fungal stain result 1 Comment  Final    Comment: (NOTE) KOH/Calcofluor preparation:  no fungus observed. Performed At: [facility] [place], [place] [id number] Rush Farmer MD [id number]   Acid Fast Smear (AFB)     Status: None   Collection Time: 04/20/20  2:08 PM   Specimen: Skin Biopsy  Result Value Ref Range Status   AFB Specimen Processing Comment  Final    Comment: Tissue Grinding and Digestion/Decontamination   Acid Fast Smear Negative  Final    Comment:  (NOTE) Performed At: [facility] [place], [place] [id number] Rush Farmer MD JM:[id number]    Source (AFB) SKIN  Final    Comment: BIOPSY Performed at [facility], [place] [address] [place], [place] [address]       Radiology Studies: No results found.   Scheduled Meds: . amLODipine  10 mg Oral Daily  . carvedilol  3.125 mg Oral BID WC  . Chlorhexidine Gluconate Cloth  6 each Topical Q0600  . clopidogrel  75 mg Oral Daily  . [START ON 04/25/2020] darbepoetin (ARANESP) injection - DIALYSIS  150 mcg Intravenous Q Wed-HD  . dolutegravir  50 mg Oral Daily  . feeding supplement (NEPRO CARB STEADY)  237 mL Oral TID BM  . heparin injection (subcutaneous)  5,000 Units Subcutaneous Q8H  . heparin sodium (porcine)      . insulin aspart  0-9 Units Subcutaneous TID WC  . lamiVUDine  300 mg Oral Daily  . levETIRAcetam  500 mg Oral Q12H  . lidocaine-EPINEPHrine  20 mL Intradermal Once  . LORazepam  2 mg Intravenous Once  . pantoprazole  40 mg Oral Daily  . pravastatin  80 mg Oral QHS  . sulfamethoxazole-trimethoprim  1 tablet Oral Once per day on Mon Wed Fri  . tamsulosin  0.4 mg Oral Daily   Continuous Infusions: . sodium chloride    . sodium chloride    . sodium chloride    . sodium chloride    . ferric gluconate (FERRLECIT/NULECIT) IV Stopped (04/20/20 1419)     LOS: 14 days   Time Spent in minutes   30 minutes  Little Ishikawa D.O. on 04/24/2020 at 8:28 AM  After 7pm go to [email or web address]

## 2020-04-25 ENCOUNTER — Encounter (HOSPITAL_COMMUNITY): Payer: Self-pay | Admitting: Internal Medicine

## 2020-04-25 ENCOUNTER — Inpatient Hospital Stay (HOSPITAL_COMMUNITY): Payer: Medicare HMO | Admitting: Anesthesiology

## 2020-04-25 ENCOUNTER — Encounter (HOSPITAL_COMMUNITY)
Admission: EM | Disposition: A | Discharge status: Skilled Nursing Facility | Payer: Self-pay | Source: Home / Self Care | Attending: Internal Medicine

## 2020-04-25 DIAGNOSIS — N185 Chronic kidney disease, stage 5: Secondary | ICD-10-CM

## 2020-04-25 HISTORY — PX: AV FISTULA PLACEMENT: SHX1204

## 2020-04-25 LAB — COMPREHENSIVE METABOLIC PANEL
ALT: 12 U/L (ref 0–44)
AST: 16 U/L (ref 15–41)
Albumin: 2.4 g/dL — ABNORMAL LOW (ref 3.5–5.0)
Alkaline Phosphatase: 81 U/L (ref 38–126)
Anion gap: 11 (ref 5–15)
BUN: 35 mg/dL — ABNORMAL HIGH (ref 8–23)
CO2: 26 mmol/L (ref 22–32)
Calcium: 8.4 mg/dL — ABNORMAL LOW (ref 8.9–10.3)
Chloride: 99 mmol/L (ref 98–111)
Creatinine, Ser: 6.27 mg/dL — ABNORMAL HIGH (ref 0.61–1.24)
GFR calc Af Amer: 10 mL/min — ABNORMAL LOW (ref >60)
GFR calc non Af Amer: 9 mL/min — ABNORMAL LOW (ref >60)
Glucose, Bld: 185 mg/dL — ABNORMAL HIGH (ref 70–99)
Potassium: 4.1 mmol/L (ref 3.5–5.1)
Sodium: 136 mmol/L (ref 135–145)
Total Bilirubin: 0.4 mg/dL (ref 0.3–1.2)
Total Protein: 5.8 g/dL — ABNORMAL LOW (ref 6.5–8.1)

## 2020-04-25 LAB — CBC
HCT: 26.8 % — ABNORMAL LOW (ref 39.0–52.0)
Hemoglobin: 8.6 g/dL — ABNORMAL LOW (ref 13.0–17.0)
MCH: 30.3 pg (ref 26.0–34.0)
MCHC: 32.1 g/dL (ref 30.0–36.0)
MCV: 94.4 fL (ref 80.0–100.0)
Platelets: 184 10*3/uL (ref 150–400)
RBC: 2.84 MIL/uL — ABNORMAL LOW (ref 4.22–5.81)
RDW: 14.6 % (ref 11.5–15.5)
WBC: 7.4 10*3/uL (ref 4.0–10.5)
nRBC: 0 % (ref 0.0–0.2)

## 2020-04-25 LAB — GLUCOSE, CAPILLARY
Glucose-Capillary: 182 mg/dL — ABNORMAL HIGH (ref 70–99)
Glucose-Capillary: 181 mg/dL — ABNORMAL HIGH (ref 70–99)
Glucose-Capillary: 120 mg/dL — ABNORMAL HIGH (ref 70–99)
Glucose-Capillary: 194 mg/dL — ABNORMAL HIGH (ref 70–99)
Glucose-Capillary: 135 mg/dL — ABNORMAL HIGH (ref 70–99)

## 2020-04-25 LAB — SURGICAL PATHOLOGY

## 2020-04-25 SURGERY — ARTERIOVENOUS (AV) FISTULA CREATION
Anesthesia: Monitor Anesthesia Care | Site: Arm Upper | Laterality: Left

## 2020-04-25 MED ORDER — DIPHENHYDRAMINE HCL 50 MG/ML IJ SOLN
INTRAMUSCULAR | Status: DC | PRN
  Administered 2020-04-25: 12.5 mg via INTRAVENOUS

## 2020-04-25 MED ORDER — DEXAMETHASONE SODIUM PHOSPHATE 10 MG/ML IJ SOLN
INTRAMUSCULAR | Status: AC
  Filled 2020-04-25: qty 1

## 2020-04-25 MED ORDER — HYDROCODONE-ACETAMINOPHEN 5-325 MG PO TABS
ORAL_TABLET | ORAL | Status: AC
  Administered 2020-04-25: 1 via ORAL
  Filled 2020-04-25: qty 1

## 2020-04-25 MED ORDER — PROPOFOL 500 MG/50ML IV EMUL
INTRAVENOUS | Status: DC | PRN
  Administered 2020-04-25: 50 ug/kg/min via INTRAVENOUS

## 2020-04-25 MED ORDER — MIDAZOLAM HCL 2 MG/2ML IJ SOLN
INTRAMUSCULAR | Status: AC
  Filled 2020-04-25: qty 2

## 2020-04-25 MED ORDER — GLYCOPYRROLATE PF 0.2 MG/ML IJ SOSY
PREFILLED_SYRINGE | INTRAMUSCULAR | Status: DC | PRN
  Administered 2020-04-25: .2 mg via INTRAVENOUS

## 2020-04-25 MED ORDER — LIDOCAINE 2% (20 MG/ML) 5 ML SYRINGE
INTRAMUSCULAR | Status: AC
  Filled 2020-04-25: qty 5

## 2020-04-25 MED ORDER — PROPOFOL 1000 MG/100ML IV EMUL
INTRAVENOUS | Status: AC
  Filled 2020-04-25: qty 100

## 2020-04-25 MED ORDER — SODIUM CHLORIDE 0.9 % IV SOLN
INTRAVENOUS | Status: DC | PRN
Start: 2020-04-25 — End: 2020-04-25

## 2020-04-25 MED ORDER — SODIUM CHLORIDE 0.9 % IV SOLN
INTRAVENOUS | Status: AC
  Filled 2020-04-25: qty 1.2

## 2020-04-25 MED ORDER — DIPHENHYDRAMINE HCL 50 MG/ML IJ SOLN
INTRAMUSCULAR | Status: AC
  Filled 2020-04-25: qty 1

## 2020-04-25 MED ORDER — PHENYLEPHRINE 40 MCG/ML (10ML) SYRINGE FOR IV PUSH (FOR BLOOD PRESSURE SUPPORT)
PREFILLED_SYRINGE | INTRAVENOUS | Status: AC
  Filled 2020-04-25: qty 10

## 2020-04-25 MED ORDER — GLYCOPYRROLATE PF 0.2 MG/ML IJ SOSY
PREFILLED_SYRINGE | INTRAMUSCULAR | Status: AC
  Filled 2020-04-25: qty 1

## 2020-04-25 MED ORDER — HYDROCODONE-ACETAMINOPHEN 5-325 MG PO TABS
1.0000 | ORAL_TABLET | ORAL | Status: DC | PRN
  Administered 2020-04-25 – 2020-04-26 (×3): 1 via ORAL
  Filled 2020-04-25 (×3): qty 1

## 2020-04-25 MED ORDER — LIDOCAINE-EPINEPHRINE 0.5 %-1:200000 IJ SOLN
INTRAMUSCULAR | Status: AC
  Filled 2020-04-25: qty 1

## 2020-04-25 MED ORDER — EPHEDRINE 5 MG/ML INJ
INTRAVENOUS | Status: AC
  Filled 2020-04-25: qty 10

## 2020-04-25 MED ORDER — ONDANSETRON HCL 4 MG/2ML IJ SOLN: 4.0000 mg | Freq: Once | INTRAMUSCULAR | Status: DC | PRN

## 2020-04-25 MED ORDER — PROPOFOL 10 MG/ML IV BOLUS
INTRAVENOUS | Status: AC
  Filled 2020-04-25: qty 40

## 2020-04-25 MED ORDER — FENTANYL CITRATE (PF) 250 MCG/5ML IJ SOLN
INTRAMUSCULAR | Status: AC
  Filled 2020-04-25: qty 5

## 2020-04-25 MED ORDER — SODIUM CHLORIDE 0.9 % IV SOLN
INTRAVENOUS | Status: DC | PRN
  Administered 2020-04-25: 500 mL

## 2020-04-25 MED ORDER — ROCURONIUM BROMIDE 10 MG/ML (PF) SYRINGE
PREFILLED_SYRINGE | INTRAVENOUS | Status: AC
  Filled 2020-04-25: qty 10

## 2020-04-25 MED ORDER — CHLORHEXIDINE GLUCONATE CLOTH 2 % EX PADS
6.0000 | MEDICATED_PAD | Freq: Every day | CUTANEOUS | Status: DC
  Administered 2020-04-26 – 2020-05-07 (×6): 6 via TOPICAL

## 2020-04-25 MED ORDER — RENA-VITE PO TABS
1.0000 | ORAL_TABLET | Freq: Every day | ORAL | Status: DC
  Administered 2020-04-25 – 2020-05-07 (×12): 1 via ORAL
  Filled 2020-04-25 (×13): qty 1

## 2020-04-25 MED ORDER — LIDOCAINE-EPINEPHRINE 0.5 %-1:200000 IJ SOLN
INTRAMUSCULAR | Status: DC | PRN
  Administered 2020-04-25: 50 mL

## 2020-04-25 MED ORDER — ONDANSETRON HCL 4 MG/2ML IJ SOLN
INTRAMUSCULAR | Status: AC
  Filled 2020-04-25: qty 2

## 2020-04-25 MED ORDER — FENTANYL CITRATE (PF) 100 MCG/2ML IJ SOLN: 25.0000 ug | INTRAMUSCULAR | Status: DC | PRN

## 2020-04-25 MED ORDER — SUCCINYLCHOLINE CHLORIDE 200 MG/10ML IV SOSY
PREFILLED_SYRINGE | INTRAVENOUS | Status: AC
  Filled 2020-04-25: qty 10

## 2020-04-25 MED ORDER — 0.9 % SODIUM CHLORIDE (POUR BTL) OPTIME
TOPICAL | Status: DC | PRN
  Administered 2020-04-25: 1000 mL

## 2020-04-25 SURGICAL SUPPLY — 34 items
ADH SKN CLS APL DERMABOND .7 (GAUZE/BANDAGES/DRESSINGS) ×1
ARMBAND PINK RESTRICT EXTREMIT (MISCELLANEOUS) ×3 IMPLANT
CANISTER SUCT 3000ML PPV (MISCELLANEOUS) ×2 IMPLANT
CANNULA VESSEL 3MM 2 BLNT TIP (CANNULA) ×2 IMPLANT
CLIP LIGATING EXTRA MED SLVR (CLIP) ×2 IMPLANT
CLIP LIGATING EXTRA SM BLUE (MISCELLANEOUS) ×2 IMPLANT
COVER PROBE W GEL 5X96 (DRAPES) ×2 IMPLANT
COVER WAND RF STERILE (DRAPES) ×1 IMPLANT
DECANTER SPIKE VIAL GLASS SM (MISCELLANEOUS) ×3 IMPLANT
DERMABOND ADVANCED (GAUZE/BANDAGES/DRESSINGS) ×1
DERMABOND ADVANCED .7 DNX12 (GAUZE/BANDAGES/DRESSINGS) ×1 IMPLANT
ELECT REM PT RETURN 9FT ADLT (ELECTROSURGICAL) ×2
ELECTRODE REM PT RTRN 9FT ADLT (ELECTROSURGICAL) ×1 IMPLANT
GLOVE SS BIOGEL STRL SZ 6.5 (GLOVE) IMPLANT
GLOVE SS BIOGEL STRL SZ 7.5 (GLOVE) ×1 IMPLANT
GLOVE SUPERSENSE BIOGEL SZ 6.5 (GLOVE) ×1
GLOVE SUPERSENSE BIOGEL SZ 7.5 (GLOVE) ×1
GOWN STRL REUS W/ TWL LRG LVL3 (GOWN DISPOSABLE) ×3 IMPLANT
GOWN STRL REUS W/ TWL XL LVL3 (GOWN DISPOSABLE) IMPLANT
GOWN STRL REUS W/TWL LRG LVL3 (GOWN DISPOSABLE) ×6
GOWN STRL REUS W/TWL XL LVL3 (GOWN DISPOSABLE) ×2
KIT BASIN OR (CUSTOM PROCEDURE TRAY) ×2 IMPLANT
KIT TURNOVER KIT B (KITS) ×2 IMPLANT
NS IRRIG 1000ML POUR BTL (IV SOLUTION) ×2 IMPLANT
PACK CV ACCESS (CUSTOM PROCEDURE TRAY) ×2 IMPLANT
PAD ARMBOARD 7.5X6 YLW CONV (MISCELLANEOUS) ×4 IMPLANT
SUT PROLENE 6 0 CC (SUTURE) ×2 IMPLANT
SUT SILK 3 0 (SUTURE) ×2
SUT SILK 3-0 18XBRD TIE 12 (SUTURE) IMPLANT
SUT VIC AB 3-0 SH 27 (SUTURE) ×2
SUT VIC AB 3-0 SH 27X BRD (SUTURE) ×1 IMPLANT
TOWEL GREEN STERILE (TOWEL DISPOSABLE) ×2 IMPLANT
UNDERPAD 30X36 HEAVY ABSORB (UNDERPADS AND DIAPERS) ×2 IMPLANT
WATER STERILE IRR 1000ML POUR (IV SOLUTION) ×2 IMPLANT

## 2020-04-25 NOTE — Transfer of Care (Signed)
Immediate Anesthesia Transfer of Care Note  Patient: Richard [name]  Procedure(s) Performed: LEFT ARM BRACHIOCEPHALIC ARTERIOVENOUS (AV) FISTULA CREATION (Left Arm Upper)  Patient Location: PACU  Anesthesia Type:MAC  Level of Consciousness: awake and patient cooperative  Airway & Oxygen Therapy: Patient Spontanous Breathing and Patient connected to face mask oxygen  Post-op Assessment: Report given to RN and Post -op Vital signs reviewed and stable  Post vital signs: Reviewed and stable  Last Vitals:  Vitals Value Taken Time  BP 162/89 04/25/20 1006  Temp    Pulse 103 04/25/20 1021  Resp 18 04/25/20 1021  SpO2 100 % 04/25/20 1021  Vitals shown include unvalidated device data.  Last Pain:  Vitals:   04/25/20 0320  TempSrc: Oral  PainSc:          Complications: No complications documented.

## 2020-04-25 NOTE — Progress Notes (Signed)
[facility]  PROGRESS NOTE  Kailin Leu FGH:[id number] DOB: May 27, 1956 DOA: 04/10/2020 PCP: [name], MD   Brief HPI:   64 year-old male with medical history of diabetes mellitus, osteoarthritis, hypertension, CKD stage V, previous CVA with residual right-sided weakness, migraine headache, poor compliance, who had refused hemodialysis access in the past, came to ED s/p seizure.  Patient has history of seizures but has been noncompliant with seizure medications.  Seizure was witnessed by his roommate and lasted for 4 minutes. He was also found to have CVA along with seizure.  Neurology was consulted.  Started on antiepileptic drugs.  Vascular surgery was consulted for access placement as patient recently started hemodialysis for ESRD.  NG tube was removed on 28th July.  Currently on p.o. diet.  Continues to be intermittently noncompliant with medications.    Subjective   Patient seen and examined, just came back after AV fistula formation in left arm.   Assessment/Plan:     1. Metabolic encephalopathy-improved, likely from seizure, hypertensive emergency, acute stroke, uremia.  Will continue to monitor. 2. Acute CVA-patient has history of CVA with residual right-sided weakness and chronic right upper extremity contracture.  CT scan on admission showed no acute finding.  Mental status changes were observed on morning of lateral dissection, code stroke was initiated.  Stat CT head showed no acute finding, MRI showed 2 mm acute to early subacute infarct within the left parietal lobe.  HIV screen was positive.  S/p lumbar puncture on 04/14/2020.  CSF Gram stain/culture was negative.  Cryptococcus PCR is negative.  VDRL CSF is negative.  Neurology was consulted and recommended to continue Plavix and statin.  Patient had core track tube which was removed and started on p.o. diet.  PT/OT was obtained, recommended skilled nursing facility placement. 3. Failure to  thrive/moderate calorie malnutrition-p.o. intake has improved.  Nutrition following.  Core track tube was removed and started on p.o. diet. 4. Recurrent seizure in setting of noncompliance-patient has history of noncompliance with seizure medications.  EEG on July 24 showed suggestive of moderate diffuse encephalopathy, nonspecific etiology.  No seizures or epileptiform discharges were seen throughout the recording.  S/p lumbar puncture on 04/14/2020.  CSF Gram stain was negative.  Cryptococcus PCR was negative.  VDRL CSF is negative.  He was started on IV Keppra and Vimpat.  Now only on Keppra as per neurology recommendation. 5. Hypertensive emergency-resolved, well controlled on amlodipine, carvedilol.  Continue to monitor patient blood pressure closely. 6. Anemia of chronic disease-hemoglobin is stable.  Follow CBC chronically. 7. HIV/AIDS-new diagnosis, CD4 count 39, viral load 6 24,000.  Infectious disease was consulted and started on Tivicay, Epivir.  Continue Bactrim 3 times a week for PCP prophylaxis. 8. Skin rash-patient had a biopsy from skin rash on 04/20/2020.  Will follow biopsy/culture report. 9. Diabetes mellitus type 2-continue sliding scale insulin with NovoLog.  Blood glucose well controlled.   Scheduled medications:    amLODipine  10 mg Oral Daily   carvedilol  3.125 mg Oral BID WC   Chlorhexidine Gluconate Cloth  6 each Topical Q0600   [START ON 04/26/2020] Chlorhexidine Gluconate Cloth  6 each Topical Q0600   clopidogrel  75 mg Oral Daily   darbepoetin (ARANESP) injection - DIALYSIS  150 mcg Intravenous Q Wed-HD   dolutegravir  50 mg Oral Daily   feeding supplement (NEPRO CARB STEADY)  237 mL Oral TID BM   heparin injection (subcutaneous)  5,000 Units Subcutaneous Q8H   insulin aspart  0-9 Units Subcutaneous TID WC   lamiVUDine  300 mg Oral Daily   levETIRAcetam  500 mg Oral Q12H   lidocaine-EPINEPHrine  20 mL Intradermal Once   LORazepam  2 mg Intravenous Once    multivitamin  1 tablet Oral QHS   pantoprazole  40 mg Oral Daily   pravastatin  80 mg Oral QHS   sulfamethoxazole-trimethoprim  1 tablet Oral Once per day on Mon Wed Fri   tamsulosin  0.4 mg Oral Daily         CBG: Recent Labs  Lab 04/24/20 1600 04/24/20 1948 04/24/20 2320 04/25/20 0319 04/25/20 1128  GLUCAP 89 78 268* 181* 120*    SpO2: 100 % O2 Flow Rate (L/min): 2 L/min    CBC: Recent Labs  Lab 04/21/20 2106 04/22/20 0222 04/23/20 0130 04/24/20 0346 04/25/20 0304  WBC 6.0 6.7 7.0 7.4 7.4  HGB 9.2* 8.7* 8.6* 8.3* 8.6*  HCT 29.0* 26.4* 26.2* 25.8* 26.8*  MCV 92.1 88.6 91.6 92.1 94.4  PLT 146* 131* 201 221 124    Basic Metabolic Panel: Recent Labs  Lab 04/20/20 0958 04/21/20 0258 04/21/20 2106 04/22/20 0222 04/23/20 0130 04/24/20 0346 04/25/20 0304  NA 137   < > 137 136 136 138 136  K 3.4*   < > 4.3 4.6 4.1 4.2 4.1  CL 99   < > 100 100 101 102 99  CO2 27   < > 25 23 25 25 26   GLUCOSE 120*   < > 139* 94 152* 177* 185*  BUN 38*   < > 31* 36* 50* 62* 35*  CREATININE 5.77*   < > 5.63* 6.20* 7.80* 9.03* 6.27*  CALCIUM 8.4*   < > 8.3* 8.3* 8.3* 8.5* 8.4*  PHOS 3.9  --  2.7  --   --   --   --    < > = values in this interval not displayed.     Liver Function Tests: Recent Labs  Lab 04/21/20 0258 04/21/20 0258 04/21/20 2106 04/22/20 0222 04/23/20 0130 04/24/20 0346 04/25/20 0304  AST 27  --   --  23 20 15 16   ALT 16  --   --  15 14 12 12   ALKPHOS 76  --   --  82 81 80 81  BILITOT 0.5  --   --  0.2* 0.7 0.7 0.4  PROT 6.2*  --   --  6.0* 5.8* 5.9* 5.8*  ALBUMIN 2.4*   < > 2.5* 2.5* 2.5* 2.4* 2.4*   < > = values in this interval not displayed.     Antibiotics: Anti-infectives (From admission, onward)   Start     Dose/Rate Route Frequency Ordered Stop   04/25/20 0000  ceFAZolin (ANCEF) IVPB 2g/100 mL premix       Note to Pharmacy: Send with pt to OR   2 g 200 mL/hr over 30 Minutes Intravenous On call 04/24/20 1021 04/25/20 0845    04/23/20 0000  sulfamethoxazole-trimethoprim (BACTRIM DS) 800-160 MG tablet     Discontinue     1 tablet Oral 3 times weekly 04/23/20 0918     04/19/20 1000  lamiVUDine (EPIVIR) tablet 300 mg     Discontinue    Note to Pharmacy: I DO NOT renally dose lamivudine and I have never had problems NOT adjusting it. I want pt to be on Dovato but still not on formulary here (unfortunately)   300 mg Oral Daily 04/18/20 1043     04/17/20 0000  Dolutegravir-lamiVUDine (DOVATO) 50-300 MG TABS     Discontinue     1 tablet Oral Daily 04/17/20 1546     04/16/20 1000  lamiVUDine (EPIVIR) tablet 300 mg  Status:  Discontinued       Note to Pharmacy: I DO NOT renally dose lamivudine and I have never had problems NOT adjusting it. I want pt to be on Dovato but still not on formulary here (unfortunately)   300 mg Per Tube Daily 04/15/20 1307 04/18/20 1043   04/16/20 0900  sulfamethoxazole-trimethoprim (BACTRIM DS) 800-160 MG per tablet 1 tablet     Discontinue     1 tablet Oral Once per day on Mon Wed Fri 04/15/20 1350     04/15/20 1245  dolutegravir (TIVICAY) tablet 50 mg     Discontinue     50 mg Oral Daily 04/15/20 1202     04/15/20 1245  lamiVUDine (EPIVIR) tablet 300 mg  Status:  Discontinued       Note to Pharmacy: I DO NOT renally dose lamivudine and I have never had problems NOT adjusting it. I want pt to be on Dovato but still not on formulary here (unfortunately)   300 mg Oral Daily 04/15/20 1202 04/15/20 1307   04/13/20 1737  ceFAZolin (ANCEF) 2-4 GM/100ML-% IVPB       Note to Pharmacy: Desiree Hane   : cabinet override      04/13/20 1737 04/14/20 0544   04/13/20 0000  ceFAZolin (ANCEF) IVPB 2g/100 mL premix        2 g 200 mL/hr over 30 Minutes Intravenous To Radiology 04/12/20 1454 04/13/20 0834       DVT prophylaxis: Heparin  Code Status: Full code  Family Communication: No family at bedside    Status is: Inpatient  Dispo: The patient is from: Home              Anticipated d/c is to:  Skilled nursing facility              Anticipated d/c date is: 04/27/2020              Patient currently not medically stable for discharge  Barrier to discharge-patient is to sit in chair for hemodialysis, if he can sit in chair on Thursday he can be discharged to skilled nursing facility.     Consultants:  Vascular surgery  Nephrology  Procedures: AV fistula formation left arm   Objective   Vitals:   04/25/20 0320 04/25/20 1005 04/25/20 1020 04/25/20 1055  BP: 118/69  (!) 181/87 (!) 165/98  Pulse: 88  96 88  Resp: 18  16 13   Temp: 98 F (36.7 C) (!) 97.1 F (36.2 C) 98 F (36.7 C) 98.6 F (37 C)  TempSrc: Oral   Oral  SpO2: 100%  100% 100%  Weight: 72.1 kg     Height:        Intake/Output Summary (Last 24 hours) at 04/25/2020 1259 Last data filed at 04/25/2020 4259 Gross per 24 hour  Intake 300 ml  Output 110 ml  Net 190 ml    08/02 1901 - 08/04 0700 In: 840 [P.O.:840] Out: 2781 [Urine:400]  Filed Weights   04/24/20 0710 04/24/20 1044 04/25/20 0320  Weight: 72.5 kg 70 kg 72.1 kg    Physical Examination:    General: Appears in no acute distress  Cardiovascular: S1-S2, regular, no murmur auscultated  Respiratory: Clear to auscultation bilaterally, no wheezing or crackles auscultated  Abdomen: Soft, nontender, no organomegaly  Extremities: No edema in the lower extremities  Neurologic: Alert oriented x3, hemiparesis in right upper and lower extremity, right upper extremity contracture    Data Reviewed:   Recent Results (from the past 240 hour(s))  Fungus Culture With Stain     Status: None (Preliminary result)   Collection Time: 04/20/20  2:08 PM   Specimen: Skin Biopsy  Result Value Ref Range Status   Fungus Stain Final report  Final    Comment: (NOTE) Performed At: [facility] [place], [place] [id number] Rush Farmer MD [id number]    Fungus (Mycology) Culture PENDING  Incomplete   Fungal Source SKIN  Final     Comment: BIOPSY Performed at [facility], [place] [address] [place], [place] [address]   Acid Fast Smear (AFB)     Status: None   Collection Time: 04/20/20  2:08 PM   Specimen: Skin Biopsy  Result Value Ref Range Status   AFB Specimen Processing Comment  Final    Comment: Tissue Grinding and Digestion/Decontamination   Acid Fast Smear Negative  Final    Comment: (NOTE) Performed At: [facility] [place], [place] [id number] Rush Farmer MD [id number]    Source (AFB) SKIN  Final    Comment: BIOPSY Performed at [facility], [place] [address], [place], [place] [address]   Fungus Culture Result     Status: None   Collection Time: 04/20/20  2:08 PM  Result Value Ref Range Status   Result 1 Comment  Final    Comment: (NOTE) KOH/Calcofluor preparation:  no fungus observed. Performed At: [facility] [place], [place] [id number] Rush Farmer MD [id number]      ProBNP (last 3 results) Recent Labs    06/22/19 1505  PROBNP 170.0*      [place]   [facility] If 7PM-7AM, please contact night-coverage at [email or web address], Office  [phone]   04/25/2020, 12:59 PM  LOS: 15 days

## 2020-04-25 NOTE — Progress Notes (Signed)
Pt transported off unit to OR for procedure. RN attempted to report off to short stay on [id number] but busy signal received. Will keep on trying until RN able to report off.  CHG bath and oral care completed. Francis Gaines Zadin Lange RN.

## 2020-04-25 NOTE — Interval H&P Note (Signed)
History and Physical Interval Note:  04/25/2020 8:19 AM  Richard [name]  has presented today for surgery, with the diagnosis of END STAGE RENAL DISEASE.  The various methods of treatment have been discussed with the patient and family. After consideration of risks, benefits and other options for treatment, the patient has consented to  Procedure(s): ARTERIOVENOUS (AV) FISTULA CREATION (Left) as a surgical intervention.  The patient's history has been reviewed, patient examined, no change in status, stable for surgery.  I have reviewed the patient's chart and labs.  Questions were answered to the patient's satisfaction.     Curt Jews

## 2020-04-25 NOTE — Progress Notes (Signed)
Subjective:  S/p AVF creation this AM-  Eating his lunch-  No c/o's  Objective Vital signs in last 24 hours: Vitals:   04/25/20 0320 04/25/20 1005 04/25/20 1020 04/25/20 1055  BP: 118/69  (!) 181/87 (!) 165/98  Pulse: 88  96 88  Resp: 18  16 13   Temp: 98 F (36.7 C) (!) 97.1 F (36.2 C) 98 F (36.7 C) 98.6 F (37 C)  TempSrc: Oral   Oral  SpO2: 100%  100% 100%  Weight: 72.1 kg     Height:       Weight change: 0.1 kg  Intake/Output Summary (Last 24 hours) at 04/25/2020 1243 Last data filed at 04/25/2020 4709 Gross per 24 hour  Intake 300 ml  Output 110 ml  Net 190 ml    Assessment/ Plan: Pt is a 64 y.o. yo male with HIV/SZ/advanced CKD as well as noncompliance who was admitted on 04/10/2020 with  sz but also found to progress to ESRD and has had dialysis initiated this hosp  Assessment/Plan: 1. sz-  Per primary team-  Anti sz meds as he will consent 2. ESRD- new diagnosis this hospital admit.  Have initiated HD-  Previously On MWF schedule via [facility] initially to be not surgically stable for an AVF/AVG- but were able to get that placed today.  CLIP in process-  Social barriers, will need SNF at discharge.    Will continue on TTS schedule for now-  For next HD will need to be in chair-  Hopefully if he has his access and can dialyze in chair on Thursday will be ready for discharge 3. Anemia- iron repletion in progress-  Also ESA-   inc dose for hgb in the 8's  4. Secondary hyperparathyroidism-  PTH 160- calc /phos WNL- on no meds  5. HTN/volume-  Overall above goal but better-  Refusing meds-  On reg of norvasc 10, coreg 3.125 (may inc), then hydralazine and labetalol PRN 6. HIV-  Tivicay-  Also a skin biopsy pending for rash felt to be ID related  7. FTT-  Albumin under 3 and will need SNF placement at discharge   [place]: Basic Metabolic Panel: Recent Labs  Lab 04/20/20 0958 04/21/20 0258 04/21/20 2106 04/22/20 0222 04/23/20 0130 04/24/20 0346  04/25/20 0304  NA 137   < > 137   < > 136 138 136  K 3.4*   < > 4.3   < > 4.1 4.2 4.1  CL 99   < > 100   < > 101 102 99  CO2 27   < > 25   < > 25 25 26   GLUCOSE 120*   < > 139*   < > 152* 177* 185*  BUN 38*   < > 31*   < > 50* 62* 35*  CREATININE 5.77*   < > 5.63*   < > 7.80* 9.03* 6.27*  CALCIUM 8.4*   < > 8.3*   < > 8.3* 8.5* 8.4*  PHOS 3.9  --  2.7  --   --   --   --    < > = values in this interval not displayed.   Liver Function Tests: Recent Labs  Lab 04/23/20 0130 04/24/20 0346 04/25/20 0304  AST 20 15 16   ALT 14 12 12   ALKPHOS 81 80 81  BILITOT 0.7 0.7 0.4  PROT 5.8* 5.9* 5.8*  ALBUMIN 2.5* 2.4* 2.4*   No results for input(s):  LIPASE, AMYLASE in the last 168 hours. No results for input(s): AMMONIA in the last 168 hours. CBC: Recent Labs  Lab 04/21/20 2106 04/21/20 2106 04/22/20 0222 04/22/20 0222 04/23/20 0130 04/24/20 0346 04/25/20 0304  WBC 6.0   < > 6.7   < > 7.0 7.4 7.4  HGB 9.2*   < > 8.7*   < > 8.6* 8.3* 8.6*  HCT 29.0*   < > 26.4*   < > 26.2* 25.8* 26.8*  MCV 92.1  --  88.6  --  91.6 92.1 94.4  PLT 146*   < > 131*   < > 201 221 184   < > = values in this interval not displayed.   Cardiac Enzymes: No results for input(s): CKTOTAL, CKMB, CKMBINDEX, TROPONINI in the last 168 hours. CBG: Recent Labs  Lab 04/24/20 1600 04/24/20 1948 04/24/20 2320 04/25/20 0319 04/25/20 1128  GLUCAP 89 78 268* 181* 120*    Iron Studies: No results for input(s): IRON, TIBC, TRANSFERRIN, FERRITIN in the last 72 hours. Studies/Results: No results found. Medications: Infusions: . ferric gluconate (FERRLECIT/NULECIT) IV Stopped (04/20/20 1419)    Scheduled Medications: . amLODipine  10 mg Oral Daily  . carvedilol  3.125 mg Oral BID WC  . Chlorhexidine Gluconate Cloth  6 each Topical Q0600  . clopidogrel  75 mg Oral Daily  . darbepoetin (ARANESP) injection - DIALYSIS  150 mcg Intravenous Q Wed-HD  . dolutegravir  50 mg Oral Daily  . feeding supplement (NEPRO  CARB STEADY)  237 mL Oral TID BM  . heparin injection (subcutaneous)  5,000 Units Subcutaneous Q8H  . insulin aspart  0-9 Units Subcutaneous TID WC  . lamiVUDine  300 mg Oral Daily  . levETIRAcetam  500 mg Oral Q12H  . lidocaine-EPINEPHrine  20 mL Intradermal Once  . LORazepam  2 mg Intravenous Once  . multivitamin  1 tablet Oral QHS  . pantoprazole  40 mg Oral Daily  . pravastatin  80 mg Oral QHS  . sulfamethoxazole-trimethoprim  1 tablet Oral Once per day on Mon Wed Fri  . tamsulosin  0.4 mg Oral Daily    have reviewed scheduled and prn medications.  Physical Exam: General: agreeable-  More conversive today  Heart: tachy Lungs: mostly clear Abdomen: soft, non tender Extremities: no edema-  Diffuse rash Dialysis Access: right sided TDC   And new left AVF-  Good thrill and bruit    04/25/2020,12:43 PM  LOS: 15 days

## 2020-04-25 NOTE — Discharge Instructions (Signed)
° °  [facility]  ° °Discharge Instructions ° °AV Fistula or Graft Surgery for Dialysis Access ° °Please refer to the following instructions for your post-procedure care. Your surgeon or physician assistant will discuss any changes with you. ° °Activity ° °You may drive the day following your surgery, if you are comfortable and no longer taking prescription pain medication. Resume full activity as the soreness in your incision resolves. ° °Bathing/Showering ° °You may shower after you go home. Keep your incision dry for 48 hours. Do not soak in a bathtub, hot tub, or swim until the incision heals completely. You may not shower if you have a hemodialysis catheter. ° °Incision Care ° °Clean your incision with mild soap and water after 48 hours. Pat the area dry with a clean towel. You do not need a bandage unless otherwise instructed. Do not apply any ointments or creams to your incision. You may have skin glue on your incision. Do not peel it off. It will come off on its own in about one week. Your arm may swell a bit after surgery. To reduce swelling use pillows to elevate your arm so it is above your heart. Your doctor will tell you if you need to lightly wrap your arm with an ACE bandage. ° °Diet ° °Resume your normal diet. There are not special food restrictions following this procedure. In order to heal from your surgery, it is CRITICAL to get adequate nutrition. Your body requires vitamins, minerals, and protein. Vegetables are the best source of vitamins and minerals. Vegetables also provide the perfect balance of protein. Processed food has little nutritional value, so try to avoid this. ° °Medications ° °Resume taking all of your medications. If your incision is causing pain, you may take over-the counter pain relievers such as acetaminophen (Tylenol). If you were prescribed a stronger pain medication, please be aware these medications can cause nausea and constipation. Prevent  nausea by taking the medication with a snack or meal. Avoid constipation by drinking plenty of fluids and eating foods with high amount of fiber, such as fruits, vegetables, and grains. Do not take Tylenol if you are taking prescription pain medications. ° ° ° ° °Follow up °Your surgeon may want to see you in the office following your access surgery. If so, this will be arranged at the time of your surgery. ° °Please call us immediately for any of the following conditions: ° °Increased pain, redness, drainage (pus) from your incision site °Fever of 101 degrees or higher °Severe or worsening pain at your incision site °Hand pain or numbness. ° °Reduce your risk of vascular disease: ° °Stop smoking. If you would like help, call [facility] at 1-800-QUIT-NOW (1-800-784-8669) or [facility] at 336-586-4000 ° °Manage your cholesterol °Maintain a desired weight °Control your diabetes °Keep your blood pressure down ° °Dialysis ° °It will take several weeks to several months for your new dialysis access to be ready for use. Your surgeon will determine when it is OK to use it. Your nephrologist will continue to direct your dialysis. You can continue to use your Permcath until your new access is ready for use. ° °If you have any questions, please call the office at [phone]. ° °

## 2020-04-25 NOTE — Progress Notes (Signed)
Nutrition Follow-up  DOCUMENTATION CODES:   Non-severe (moderate) malnutrition in context of chronic illness  INTERVENTION:  Rena-vit daily   Continue Nepro Shake po TID, each supplement provides 425 kcal and 19 grams protein   Continue Magic cup TID with meals, each supplement provides 290 kcal and 9 grams of protein   NUTRITION DIAGNOSIS:   Moderate Malnutrition related to chronic illness (CKDV/ESRD starting HD) as evidenced by mild muscle depletion, mild fat depletion.  Ongoing  GOAL:   Patient will meet greater than or equal to 90% of their needs  Progressing  MONITOR:   Supplement acceptance, PO intake, Diet advancement, Weight trends, Labs, I & O's  REASON FOR ASSESSMENT:   Consult Enteral/tube feeding initiation and management  ASSESSMENT:   Pt admitted with acute metabolic encephalopathy, likely mulitfactorial including seizure, HTN, acute stroke, uremia, and possible CNS infection. PMH includes DM, OA, HTN, CKD V, neuropathies, previous CVA, hx seizures, and poor compliance (has previously refused dialysis and is noncompliant with medications). Pt now has ESRD with acidosis and some uremia and has agreed to beginning dialysis.  7/23 - Cortrak placed (gastric); s/p tunneled dialysis catheter placement  7/24 - s/p LP, negative cryptococcal antigen 7/27 - diet advanced to dysphagia 3 with thin liquids 7/28 Cortrak removed, diet advanced to regular  Pt unavailable at time of RD visit. RN reports pt off floor for procedure.   Per MD, pt is stable for discharge and is awaiting a bed in SNF and outpatient HD.   Pt's appetite is improving.  PO intake: 25-100% x last 6 recorded meals (79% average meal intake)  Last HD 8/3, net UF 2,348ml  Labs: CBGs 78-268 Medications: Aranesp, Nepro po TID, Novolog, Protonix  Diet Order:   Diet Order            Diet renal/carb modified with fluid restriction Diet-HS Snack? Nothing; Fluid restriction: 1200 mL Fluid; Room  service appropriate? Yes; Fluid consistency: Thin  Diet effective now                 EDUCATION NEEDS:   No education needs have been identified at this time  Skin:  Skin Assessment: Reviewed RN Assessment  Last BM:  8/4  Height:   Ht Readings from Last 1 Encounters:  04/23/20 6' (1.829 m)    Weight:   Wt Readings from Last 1 Encounters:  04/25/20 72.1 kg    BMI:  Body mass index is 21.56 kg/m.  Estimated Nutritional Needs:   Kcal:  6468-0321  Protein:  115-130 grams  Fluid:  1020ml + UOP    Larkin Ina, MS, RD, LDN RD pager number and weekend/on-call pager number located in [place].

## 2020-04-25 NOTE — Plan of Care (Signed)
  Problem: Clinical Measurements: Goal: Will remain free from infection Outcome: Progressing Goal: Respiratory complications will improve Outcome: Progressing   Problem: Activity: Goal: Risk for activity intolerance will decrease Outcome: Progressing   Problem: Nutrition: Goal: Adequate nutrition will be maintained Outcome: Progressing   

## 2020-04-25 NOTE — Progress Notes (Signed)
Patient has been accepted at [facility] for OP HD treatment on a TTS schedule with a seat time of 12:30pm. He needs to arrive to his appointments at 12:10pm.  On his first day at the clinic, he needs to arrive at 11:15am to complete intake paperwork. Since patient is only oriented to self, patient's daughter will need to accompany him on his first day to assist with signing consents for treatment, unless orientation status improves.  Navigator updated Nephrologist/Dr. Moshe Cipro and CSW/L. Hendrum.  Navigator will follow closely.  Alphonzo Cruise, [place] [facility] Navigator [phone]

## 2020-04-25 NOTE — Progress Notes (Signed)
PT Cancellation Note  Patient Details Name: Richard [name] MRN: [id number] DOB: 01-03-1956   Cancelled Treatment:    Reason Eval/Treat Not Completed: Patient at procedure or test/unavailable. Pt currently off unit for procedure. Will check back as able to continue with PT POC.    [name] 04/25/2020, 10:45 AM   Rolinda Roan, PT, DPT Acute Rehabilitation Services Pager: [phone] Office: [phone]

## 2020-04-25 NOTE — Progress Notes (Signed)
° ° ° ° °  INFECTIOUS DISEASE ATTENDING ADDENDUM:   Date: 04/25/2020  Patient name: Richard [name]  Medical record number: [id number]  Date of birth: 12/03/1955   Patient has had L AV fistula created by [facility]  Dermatopath c/w psoriaform rash  He should continue on Dovato (getting in the hospital as 2 meds) with 30 day supply ready for him at DC  He also needs to continue Bactrim DS TIW   Jaymason Ledesma has an appointment on 06/06/2020 at 10 AM with Dr. Tommy Medal at   [facility] is located in the [facility] at  [address] in [place].  [address], which is located to the left of the elevators.  Phone: [phone]  Fax: [phone]  [email or web address]  He should arrive 15 minutes prior to his appt.  He has 2 other appts that day within [facility]  I will sign off for now  Please call with further questions.    Alcide Evener 04/25/2020, 5:15 PM

## 2020-04-25 NOTE — Anesthesia Procedure Notes (Signed)
Procedure Name: MAC Date/Time: 04/25/2020 8:54 AM Performed by: Lowella Dell, CRNA Pre-anesthesia Checklist: Patient identified, Emergency Drugs available, Suction available, Timeout performed and Patient being monitored Patient Re-evaluated:Patient Re-evaluated prior to induction Oxygen Delivery Method: Simple face mask Induction Type: IV induction Placement Confirmation: positive ETCO2 Dental Injury: Teeth and Oropharynx as per pre-operative assessment

## 2020-04-25 NOTE — Op Note (Signed)
    OPERATIVE REPORT  DATE OF SURGERY: 04/25/2020  PATIENT: Richard [name], 64 y.o. male MRN: [id number]  DOB: 1955-12-20  PRE-OPERATIVE DIAGNOSIS: End-stage renal disease  POST-OPERATIVE DIAGNOSIS:  Same  PROCEDURE: Left radiocephalic AV fistula creation  SURGEON:  Curt Jews, M.D.  PHYSICIAN ASSISTANT: Setzer PAC  The assistant was needed for exposure and to expedite the case  ANESTHESIA: Local with sedation  EBL: per anesthesia record  Total I/O In: 300 [I.V.:300] Out: 10 [Blood:10]  BLOOD ADMINISTERED: none  DRAINS: none  SPECIMEN: none  COUNTS CORRECT:  YES  PATIENT DISPOSITION:  PACU - hemodynamically stable  PROCEDURE DETAILS: Patient was taken operating placed supine position where the area of the left arm prepped draped in sterile fashion.  SonoSite ultrasound was used to visualize the basilic vein and cephalic vein.  The basilic vein was very small the cephalic vein was of good caliber at the antecubital space.  It was small in the forearm.  Using local anesthesia, an incision was made across the antecubital space and carried down to isolate the cephalic vein which was a good caliber and the brachial artery which was of normal size with minimal atherosclerotic change.  The cephalic vein was ligated distally and divided.  It was mobilized to the level of the brachial artery.  The brachial artery was occluded proximally distally and was opened with an 11 blade and sent longitudinally with Potts scissors.  The vein was cut to the appropriate length and spatulated and sewn end-to-side to the artery with a running 6-0 Prolene suture.  Clamps removed and excellent thrill was noted.  The patient maintained a left radial pulse.  The wound was irrigated with saline.  Hemostasis left cautery.  Wound was closed with 3-0 Vicryl in the subcutaneous and subcuticular tissue.  Sterile dressing was applied and the patient was transferred to the recovery room in stable  condition   Richard [name], M.D., [facility] 04/25/2020 10:35 AM

## 2020-04-26 ENCOUNTER — Encounter (HOSPITAL_COMMUNITY): Payer: Self-pay | Admitting: Vascular Surgery

## 2020-04-26 DIAGNOSIS — Z4659 Encounter for fitting and adjustment of other gastrointestinal appliance and device: Secondary | ICD-10-CM

## 2020-04-26 LAB — GLUCOSE, CAPILLARY
Glucose-Capillary: 100 mg/dL — ABNORMAL HIGH (ref 70–99)
Glucose-Capillary: 119 mg/dL — ABNORMAL HIGH (ref 70–99)
Glucose-Capillary: 128 mg/dL — ABNORMAL HIGH (ref 70–99)
Glucose-Capillary: 130 mg/dL — ABNORMAL HIGH (ref 70–99)
Glucose-Capillary: 135 mg/dL — ABNORMAL HIGH (ref 70–99)
Glucose-Capillary: 238 mg/dL — ABNORMAL HIGH (ref 70–99)
Glucose-Capillary: 88 mg/dL (ref 70–99)
Glucose-Capillary: 96 mg/dL (ref 70–99)

## 2020-04-26 LAB — CBC
HCT: 27.3 % — ABNORMAL LOW (ref 39.0–52.0)
Hemoglobin: 8.9 g/dL — ABNORMAL LOW (ref 13.0–17.0)
MCH: 30.2 pg (ref 26.0–34.0)
MCHC: 32.6 g/dL (ref 30.0–36.0)
MCV: 92.5 fL (ref 80.0–100.0)
Platelets: 205 10*3/uL (ref 150–400)
RBC: 2.95 MIL/uL — ABNORMAL LOW (ref 4.22–5.81)
RDW: 14.8 % (ref 11.5–15.5)
WBC: 7.6 10*3/uL (ref 4.0–10.5)
nRBC: 0 % (ref 0.0–0.2)

## 2020-04-26 LAB — RENAL FUNCTION PANEL
Albumin: 2.5 g/dL — ABNORMAL LOW (ref 3.5–5.0)
Anion gap: 12 (ref 5–15)
BUN: 49 mg/dL — ABNORMAL HIGH (ref 8–23)
CO2: 25 mmol/L (ref 22–32)
Calcium: 8.8 mg/dL — ABNORMAL LOW (ref 8.9–10.3)
Chloride: 101 mmol/L (ref 98–111)
Creatinine, Ser: 7.57 mg/dL — ABNORMAL HIGH (ref 0.61–1.24)
GFR calc Af Amer: 8 mL/min — ABNORMAL LOW (ref >60)
GFR calc non Af Amer: 7 mL/min — ABNORMAL LOW (ref >60)
Glucose, Bld: 114 mg/dL — ABNORMAL HIGH (ref 70–99)
Phosphorus: 5.1 mg/dL — ABNORMAL HIGH (ref 2.5–4.6)
Potassium: 4.8 mmol/L (ref 3.5–5.1)
Sodium: 138 mmol/L (ref 135–145)

## 2020-04-26 MED ORDER — HEPARIN SODIUM (PORCINE) 1000 UNIT/ML IJ SOLN
INTRAMUSCULAR | Status: AC
  Filled 2020-04-26: qty 1

## 2020-04-26 MED ORDER — DARBEPOETIN ALFA 150 MCG/0.3ML IJ SOSY
150.0000 ug | PREFILLED_SYRINGE | INTRAMUSCULAR | Status: DC
  Administered 2020-05-03: 150 ug via INTRAVENOUS

## 2020-04-26 MED ORDER — HEPARIN SODIUM (PORCINE) 1000 UNIT/ML DIALYSIS: 20.0000 [IU]/kg | INTRAMUSCULAR | Status: DC | PRN

## 2020-04-26 MED ORDER — DARBEPOETIN ALFA 150 MCG/0.3ML IJ SOSY
PREFILLED_SYRINGE | INTRAMUSCULAR | Status: AC
  Administered 2020-04-26: 150 ug via INTRAVENOUS
  Filled 2020-04-26: qty 0.3

## 2020-04-26 NOTE — Progress Notes (Signed)
[facility]  Subjective  - No complaints about the left UE.   Objective (!) 167/76 78 97.7 F (36.5 C) (Oral) 16 100%  Intake/Output Summary (Last 24 hours) at 04/26/2020 9355 Last data filed at 04/26/2020 0500 Gross per 24 hour  Intake 780 ml  Output 411 ml  Net 369 ml    Palpable pulse, grip 5/5, sensation intact left UE Palpable thrill in fistula, incision healing welll Lungs non labored breathing  Assessment/Planning: POD # 1 Left UE AV fistula   Fistula creation with good thrill and no sign of steal He will f/u in 4-6 weeks for duplex and exam of the left UE AV fistula.  Roxy Horseman 04/26/2020 7:12 AM --  Laboratory Lab Results: Recent Labs    04/24/20 0346 04/25/20 0304  WBC 7.4 7.4  HGB 8.3* 8.6*  HCT 25.8* 26.8*  PLT 221 184   BMET Recent Labs    04/24/20 0346 04/25/20 0304  NA 138 136  K 4.2 4.1  CL 102 99  CO2 25 26  GLUCOSE 177* 185*  BUN 62* 35*  CREATININE 9.03* 6.27*  CALCIUM 8.5* 8.4*    COAG Lab Results  Component Value Date   INR 1.1 04/13/2020   INR 1.0 02/13/2020   INR 0.89 08/23/2012   No results found for: PTT

## 2020-04-26 NOTE — Progress Notes (Signed)
Daily Progress Note   Patient Name: Richard [name]       Date: 04/26/2020 DOB: 05/16/1956  Age: 64 y.o. MRN#: [id number] Attending Physician: Oswald Hillock, MD Primary Care Physician: [name], MD Admit Date: 04/10/2020  Reason for Consultation/Follow-up: Disposition and Establishing goals of care  Subjective: Chart review performed. AVF placed 04/25/20. Received report from RN. RN states patient was not interested in taking medications today and not eating. Patient received HD in chair today 04/26/20.  Went to visit patient at bedside - no family present. Patient was lying in bed asleep. He woke up to voice/gentle touch. His speech was mumbled and very hard to understand - he did not open his eyes during conversation. He could not state the year or where he was, denied pain, stated he did remember receiving dialysis today. He was lethargic and had to be woken up several times during conversation.    Called [name] to discuss and provide updates. Discussed AVF placement yesterday and HD performed in chair today with low BP and period of unresponsiveness. Also provided update that patient was not eating, required 3+ assist to transfer from chair to bed, and not taking medications today per primary RN. Aggressive medical intervention vs comfort path was briefly discussed again. [name] is still interested in having the patient go to [facility] and receive outpatient hemodialysis. She is still agreeable for outpatient Palliative Care to follow.  Medical recommendation was given for DNR/DNI in light of the patient's current medical condition with education provided - [name] was not agreeable to DNR/DNI at this time. She stated she wanted to talk over that decision with her  uncle.  Discussed with [name] the importance of continued conversation with the patient, family, and medical providers regarding overall plan of care and treatment options, ensuring decisions are within the context of the patient's values and GOCs.   Length of Stay: 16  Current Medications: Scheduled Meds:  . amLODipine  10 mg Oral Daily  . carvedilol  3.125 mg Oral BID WC  . Chlorhexidine Gluconate Cloth  6 each Topical Q0600  . clopidogrel  75 mg Oral Daily  . darbepoetin (ARANESP) injection - DIALYSIS  150 mcg Intravenous Q Thu-HD  . dolutegravir  50 mg Oral Daily  . feeding supplement (NEPRO CARB STEADY)  237 mL  Oral TID BM  . heparin injection (subcutaneous)  5,000 Units Subcutaneous Q8H  . insulin aspart  0-9 Units Subcutaneous TID WC  . lamiVUDine  300 mg Oral Daily  . levETIRAcetam  500 mg Oral Q12H  . lidocaine-EPINEPHrine  20 mL Intradermal Once  . LORazepam  2 mg Intravenous Once  . multivitamin  1 tablet Oral QHS  . pantoprazole  40 mg Oral Daily  . pravastatin  80 mg Oral QHS  . sulfamethoxazole-trimethoprim  1 tablet Oral Once per day on Mon Wed Fri  . tamsulosin  0.4 mg Oral Daily    Continuous Infusions: . ferric gluconate (FERRLECIT/NULECIT) IV Stopped (04/20/20 1419)    PRN Meds: hydrALAZINE, HYDROcodone-acetaminophen, labetalol  Physical Exam Vitals and nursing note reviewed.  Constitutional:      General: He is not in acute distress.    Appearance: He is ill-appearing.  HENT:     Head: Normocephalic and atraumatic.  Pulmonary:     Effort: No respiratory distress.  Skin:    General: Skin is warm and dry.  Neurological:     Mental Status: He is lethargic.     Motor: No weakness.  Psychiatric:        Speech: Speech is slurred.        Behavior: Behavior is slowed.        Cognition and Memory: Cognition is impaired.             Vital Signs: BP 122/78 (BP Location: Right Arm)   Pulse 93   Temp 98 F (36.7 C) (Oral)   Resp 20   Ht 6'  (1.829 m)   Wt 69.4 kg   SpO2 100%   BMI 20.75 kg/m  SpO2: SpO2: 100 % O2 Device: O2 Device: Room Air O2 Flow Rate: O2 Flow Rate (L/min): 2 L/min  Intake/output summary:   Intake/Output Summary (Last 24 hours) at 04/26/2020 1553 Last data filed at 04/26/2020 1155 Gross per 24 hour  Intake 240 ml  Output 1747 ml  Net -1507 ml   LBM: Last BM Date: 04/25/20 Baseline Weight: Weight: 69.1 kg Most recent weight: Weight:  (unable to weigh due to in chair )       Palliative Assessment/Data: PPS 30%    Flowsheet Rows     Most Recent Value  Intake Tab  Referral Department Critical care  Unit at Time of Referral Intermediate Care Unit  Palliative Care Primary Diagnosis Other (Comment)  [multiple organ failure]  Date Notified 04/13/20  Palliative Care Type New Palliative care  Reason for referral Clarify Goals of Care  Date of Admission 04/10/20  Date first seen by Palliative Care 04/14/20  # of days Palliative referral response time 1 Day(s)  # of days IP prior to Palliative referral 3  Clinical Assessment  Psychosocial & Spiritual Assessment  Palliative Care Outcomes      Patient Active Problem List   Diagnosis Date Noted  . Weakness   . Creatinine elevation   . Goals of care, counseling/discussion   . Hypertensive urgency   . Meningoencephalitis   . Palliative care by specialist   . ESRD (end stage renal disease) ([place])   . AIDS (acquired immune deficiency syndrome) ([place]) 04/13/2020  . Encephalopathy 04/13/2020  . Seizure ([place]) 04/10/2020  . History of cerebrovascular accident (CVA) with residual deficit 03/19/2020  . CKD (chronic kidney disease) stage 5, GFR less than 15 ml/min (HCC) 02/15/2020  . Altered mental status 02/13/2020  . Neuropathy in diabetes ([place])   .  Migraines   . Hypertension   . GERD (gastroesophageal reflux disease)   . Diabetes mellitus, type II, insulin dependent ([place])   . Arthritis   . Hyperlipidemia 08/25/2012  . Chronic kidney disease,  stage 4 (severe) ([place]) 08/23/2012  . Tobacco abuse 08/23/2012  . Malnutrition of moderate degree ([place]) 08/23/2012  . 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 He was started back on Keppra and hypertension was treated with labetalol and hydralazine. MRI of brain showed 2 mm infarct in left parietal lobe. A tunneled dialysis catheter was placed and dialysis was started on 7/23 with imporvement. 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 Diffuse rash present - skin lesion biopsy returned dermatopath c/w psoriasiform rash. AVF placed 04/25/20.  Assessment: Acute metabolic encephalopathy ESRD now on dialysis Acute stroke with history of stroke Seizures New diagnosis of HIV/AIDS Failure to thrive Skin lesions History of noncompliance  Recommendations/Plan:  Continue current full scope medical treatment  Continue full code status  [name] is wanting patient to discharge to [facility] with outpatient hemodialysis treatments  Recommend outpatient Palliative to follow - [facility] consult previously placed; ACC is aware of referral  PMT will continue to follow peripherally. If there are any imminent needs please call the service directly  Goals of Care and Additional Recommendations:  Limitations on Scope of Treatment: Full Scope Treatment  Code Status:    Code Status Orders  (From admission, onward)         Start     Ordered   04/10/20 2332  Full code   Continuous        04/10/20 2331        Code Status History    Date Active Date Inactive Code Status Order ID Comments User Context   02/13/2020 2345 02/16/2020 2347 Full Code [id number]  Elwyn Reach, MD Inpatient   11/29/2014 2333 11/30/2014 1920 Full Code [id number]  Allyne Gee, MD Inpatient   Advance Care Planning Activity       Prognosis:   Unable to determine  Discharge Planning:  [place] for rehab with Palliative care service follow-up  Care plan was discussed with primary RN, patient, [name]  Thank you for allowing the Palliative Medicine Team to assist in the care of this patient.   Total Time 25 minutes Prolonged Time Billed  no       Greater than 50%  of this time was spent counseling and coordinating care related to the above assessment and plan.  Lin Landsman, NP  Please contact Palliative Medicine Team phone at [phone] for questions and concerns.

## 2020-04-26 NOTE — Anesthesia Postprocedure Evaluation (Signed)
Anesthesia Post Note  Patient: Richard [name]  Procedure(s) Performed: LEFT ARM BRACHIOCEPHALIC ARTERIOVENOUS (AV) FISTULA CREATION (Left Arm Upper)     Patient location during evaluation: PACU Anesthesia Type: MAC Level of consciousness: awake and alert Pain management: pain level controlled Vital Signs Assessment: post-procedure vital signs reviewed and stable Respiratory status: spontaneous breathing Cardiovascular status: stable Anesthetic complications: no   No complications documented.  Last Vitals:  Vitals:   04/26/20 1030 04/26/20 1100  BP: 114/63 107/61  Pulse: 94 100  Resp: 13 15  Temp:    SpO2: 100% 100%    Last Pain:  Vitals:   04/26/20 0800  TempSrc: Oral  PainSc: 0-No pain                 Nolon Nations

## 2020-04-26 NOTE — Progress Notes (Signed)
Physical Therapy Treatment Patient Details Name: Richard [name] MRN: [id number] DOB: 1956/03/05 Today's Date: 04/26/2020    History of Present Illness 64 year old male with medical history of Arthritis, Diabetes mellitus, type II, insulin dependent, GERD, HTN, Immune deficiency disorder, migraines, neuropathy in diabetes, ruptured lumbar disc, ESRD stage V , GFR < 15 ml/min, declined  Dialysis in the past, CVA with residual right sided weakness, seizures admitted 7/20 through ED with seizure activity lasting 4 minutes per roommate, and encephalopathy. MRI of brain showed 2 mm infarct in left parietal lobe. A tunneled dialysis catheter was placed and  Dialysis was started on 7/23; New diagnosis this admission of HIV/AIDS     PT Comments    Pt in bed upon arrival of PT, agreeable to session with focus on LE stretches and exercises only due to reports of fatigue from dialysis and significant lethargy through session. The pt was able to participate in general exercises and both calf and HS stretches, but declined bed mobility, transfers, and any OOB mobility at this time. The pt will continue to benefit from skilled PT to further progress activity tolerance, stability, and functional strength, and may get the most out of sessions on days where he does not receive HD.    Follow Up Recommendations  SNF;Supervision/Assistance - 24 hour     Equipment Recommendations  None recommended by PT    Recommendations for Other Services       Precautions / Restrictions Precautions Precautions: Fall Precaution Comments: Pt with NG tube Restrictions Weight Bearing Restrictions: No    Mobility  Bed Mobility Overal bed mobility: Needs Assistance (pt declined at this time citing fatigue due to HD)                Transfers - Pt declined all further mobility due to fatigue                        Balance Overall balance assessment: Needs assistance                                           Cognition Arousal/Alertness: Lethargic Behavior During Therapy: Flat affect Overall Cognitive Status: Difficult to assess                                 General Comments: Pt with minimal active engagement verbally, reporting due to [place] from HD. intermittant following of commands      Exercises General Exercises - Lower Extremity Ankle Circles/Pumps: AAROM;Both;15 reps;Supine (with 10 sec hold for calf stretch) Heel Slides: AAROM;Both;10 reps;Supine Hip ABduction/ADduction: AAROM;Both;10 reps;Supine Other Exercises Other Exercises: supine HS stretch. Pt positioned in supine with ~90 deg hip flex and ~90 deg knee flex and then attempted to progress knee ext to stretch HS. held for 2 x 20 sec each leg    General Comments General comments (skin integrity, edema, etc.): Pt reporting significant fatigue and declined all mobility. however, he requested bed-level LE exercises due to BLE feeling "stiff". all VSS      Pertinent Vitals/Pain Faces Pain Scale: Hurts little more Pain Location: hips with flexion and bilateral hamstrings with knee extension Pain Descriptors / Indicators: Discomfort;Grimacing;Moaning Pain Intervention(s): Monitored during session           PT Goals (current goals can now  be found in the care plan section) Acute Rehab PT Goals Patient Stated Goal: to get some rest PT Goal Formulation: With patient Time For Goal Achievement: 04/30/20 Potential to Achieve Goals: Fair Progress towards PT goals: Not progressing toward goals - comment (pt declined OOB mobility)    Frequency    Min 3X/week      PT Plan Current plan remains appropriate       AM-PAC PT "6 Clicks" Mobility   Outcome Measure  Help needed turning from your back to your side while in a flat bed without using bedrails?: Total Help needed moving from lying on your back to sitting on the side of a flat bed without using bedrails?: Total Help  needed moving to and from a bed to a chair (including a wheelchair)?: Total Help needed standing up from a chair using your arms (e.g., wheelchair or bedside chair)?: Total Help needed to walk in hospital room?: Total Help needed climbing 3-5 steps with a railing? : Total 6 Click Score: 6    End of Session   Activity Tolerance: Patient limited by fatigue (lethargy) Patient left: in bed;with call bell/phone within reach;with bed alarm set Nurse Communication: Mobility status PT Visit Diagnosis: Muscle weakness (generalized) (M62.81);Difficulty in walking, not elsewhere classified (R26.2)     Time: 7564-3329 PT Time Calculation (min) (ACUTE ONLY): 14 min  Charges:  $Therapeutic Exercise: 8-22 mins                     Karma Ganja, PT, DPT   Acute Rehabilitation Department Pager #: [phone]   Otho Bellows 04/26/2020, 3:20 PM

## 2020-04-26 NOTE — TOC Progression Note (Signed)
Transition of Care [facility]) - Progression Note    Patient Details  Name: Taejon Irani MRN: [id number] Date of Birth: 03/03/1956  Transition of Care [facility]) CM/SW [place], [place] Phone Number: 04/26/2020, 2:34 PM  Clinical Narrative:   CSW following for SNF placement. Patient went to HD in chair today, so ready for outpatient dialysis. CSW updated [facility], and they will initiate insurance authorization when PT/OT notes are updated. CSW to send notes when they are available. CSW also met with daughter at bedside to sign SCAT application, and submitted for review. CSW to follow.    Expected Discharge Plan: [place] Barriers to Discharge: Continued Medical Work up, Waiting for outpatient dialysis, Transportation, Ship broker  Expected Discharge Plan and Services Expected Discharge Plan: [place]: [place] arrangements for the past 2 months: Single Family Home                                       Social Determinants of Health (SDOH) Interventions    Readmission Risk Interventions Readmission Risk Prevention Plan 02/15/2020  Transportation Screening Complete  PCP or Specialist Appt within 5-7 Days Not Complete  Not Complete comments pending disposition  Home Care Screening Complete  Medication Review (RN CM) Referral to Pharmacy  Some recent data might be hidden

## 2020-04-26 NOTE — Progress Notes (Signed)
Occupational Therapy Treatment Patient Details Name: Richard [name] MRN: [id number] DOB: 1956-09-01 Today's Date: 04/26/2020    History of present illness 64 year old male with medical history of Arthritis, Diabetes mellitus, type II, insulin dependent, GERD, HTN, Immune deficiency disorder, migraines, neuropathy in diabetes, ruptured lumbar disc, ESRD stage V , GFR < 15 ml/min, declined  Dialysis in the past, CVA with residual right sided weakness, seizures admitted 7/20 through ED with seizure activity lasting 4 minutes per roommate, and encephalopathy. MRI of brain showed 2 mm infarct in left parietal lobe. A tunneled dialysis catheter was placed and  Dialysis was started on 7/23; New diagnosis this admission of HIV/AIDS    OT comments  Upon arrival pt sleeping in bed with trunk leaning to R side. Pt agreeable to bed level activities for comfort to go back to sleep. Pt with increased fatigue, but more awake than previous OT sessions after HD. Pt able to follow commands with increased time with supervision to take off his mask and set up assist to wash his face using his L hand. Pt needing max A to shift trunk to midline in chair position in bed but attempting to assist by trying to move his L side to shift weight. Pt's R arm stretched with gentle PROM to attempt to decrease pain and tone. Pt left in bed with PT for BLE PROM to decrease stiffness in legs. Believe dc plans remain appropriate - will follow acutely.   Follow Up Recommendations  SNF;Supervision/Assistance - 24 hour    Equipment Recommendations  3 in 1 bedside commode       Precautions / Restrictions Precautions Precautions: Fall Precaution Comments: Pt with NG tube Restrictions Weight Bearing Restrictions: No       Mobility Bed Mobility Overal bed mobility: Needs Assistance             General bed mobility comments: Pt trying to right trunk when leaning to R in sitting position in bed. Still needing Max A to  move trunk  Transfers                 General transfer comment: Not able to progress to OOB mobility this date.     Balance Overall balance assessment: Needs assistance Sitting-balance support: No upper extremity supported;Feet supported;Feet unsupported Sitting balance-Leahy Scale: Poor Sitting balance - Comments: Placed in chair position and Max physical assist given to shift trunk upright     Standing balance-Leahy Scale: Zero Standing balance comment: unable                           ADL either performed or assessed with clinical judgement   ADL Overall ADL's : Needs assistance/impaired     Grooming: Set up;Wash/dry face Grooming Details (indicate cue type and reason): Pt able to wash face with set up assist and verbal cues. Pt also able to doff mask with supervision                               General ADL Comments: Pt with generalized weakness and decreased functional use of BUEs               Cognition Arousal/Alertness: Lethargic Behavior During Therapy: Flat affect Overall Cognitive Status: Impaired/Different from baseline Area of Impairment: Attention;Following commands;Safety/judgement;Awareness;Problem solving  Current Attention Level: Focused   Following Commands: Follows one step commands with increased time;Follows multi-step commands inconsistently Safety/Judgement: Decreased awareness of safety;Decreased awareness of deficits Awareness: Intellectual Problem Solving: Slow processing;Decreased initiation;Difficulty sequencing;Requires verbal cues;Requires tactile cues General Comments: Pt more awake than he usually is after HD treatments but still presenting with increased fatigue. Responding to questions with 1 word responses.         Exercises Exercises: Shoulder General Exercises - Lower Extremity Ankle Circles/Pumps: AAROM;Both;15 reps;Supine (with 10 sec hold for calf stretch) Heel Slides:  AAROM;Both;10 reps;Supine Hip ABduction/ADduction: AAROM;Both;10 reps;Supine Shoulder Exercises Shoulder Flexion: PROM;Seated;5 reps;Right Elbow Flexion: PROM;5 reps;Right;Seated Elbow Extension: PROM;Right;5 reps;Seated Wrist Flexion: PROM;5 reps;Right;Seated Wrist Extension: PROM;5 reps;Right;Seated Other Exercises Other Exercises: supine HS stretch. Pt positioned in supine with ~90 deg hip flex and ~90 deg knee flex and then attempted to progress knee ext to stretch HS. held for 2 x 20 sec each leg      General Comments Pt reporting significant fatigue and declined all mobility. however, he requested bed-level LE exercises due to BLE feeling "stiff". all VSS    Pertinent Vitals/ Pain       Pain Assessment: Faces Faces Pain Scale: Hurts little more Pain Location: R arm with gentle ROM Pain Descriptors / Indicators: Discomfort;Grimacing;Moaning Pain Intervention(s): Monitored during session         Frequency  Min 2X/week        Progress Toward Goals  OT Goals(current goals can now be found in the care plan section)  Progress towards OT goals: Progressing toward goals  Acute Rehab OT Goals Patient Stated Goal: to get some rest OT Goal Formulation: With patient  Plan Discharge plan remains appropriate       AM-PAC OT "6 Clicks" Daily Activity     Outcome Measure   Help from another person eating meals?: A Little Help from another person taking care of personal grooming?: A Little Help from another person toileting, which includes using toliet, bedpan, or urinal?: Total Help from another person bathing (including washing, rinsing, drying)?: Total Help from another person to put on and taking off regular upper body clothing?: A Lot Help from another person to put on and taking off regular lower body clothing?: Total 6 Click Score: 11    End of Session    OT Visit Diagnosis: Muscle weakness (generalized) (M62.81);Pain;Other symptoms and signs involving the nervous  system (R29.898) Pain - Right/Left: Right Pain - part of body: Arm   Activity Tolerance Patient limited by lethargy   Patient Left in bed;with call bell/phone within reach;with bed alarm set           Time: 7035-0093 OT Time Calculation (min): 16 min  Charges: OT General Charges $OT Visit: 1 Visit OT Treatments $Self Care/Home Management : 8-22 mins  Cyenna Rebello/OTS   Shylah Dossantos 04/26/2020, 3:37 PM

## 2020-04-26 NOTE — Progress Notes (Signed)
OT Cancellation Note  Patient Details Name: Toshiyuki Fredell MRN: [id number] DOB: December 26, 1955   Cancelled Treatment:    Reason Eval/Treat Not Completed: Patient at procedure or test/ unavailable, pt at HD.  Will follow and see as able.   Jolaine Artist, OT Acute Rehabilitation Services Pager [phone] Office [phone]   Delight Stare 04/26/2020, 10:51 AM

## 2020-04-26 NOTE — Progress Notes (Signed)
Patient becomes unresponsive during dialysis when BP reaches a threshold of below 96 systolic. The monitor does not pick up any abnormal changes, the patient does not respond to sternal rubs. Once the saline is opened patient responds. Alerted MD Moshe Cipro will continue treatment and keep even.

## 2020-04-26 NOTE — Progress Notes (Signed)
Subjective:  Seen on HD in chair-  Sleeping- difficult to arouse but no c/o's-  AVF with thrill   Objective Vital signs in last 24 hours: Vitals:   04/26/20 0800 04/26/20 0808 04/26/20 0830 04/26/20 0900  BP: (!) 154/89 (!) 158/78 104/62 99/64  Pulse: 88 85 99 99  Resp: 17   17  Temp: 98.3 F (36.8 C)     TempSrc: Oral     SpO2:    98%  Weight:      Height:       Weight change: -3.1 kg  Intake/Output Summary (Last 24 hours) at 04/26/2020 0931 Last data filed at 04/26/2020 0500 Gross per 24 hour  Intake 780 ml  Output 411 ml  Net 369 ml    Assessment/ Plan: Pt is a 64 y.o. yo male with HIV/SZ/advanced CKD as well as noncompliance who was admitted on 04/10/2020 with  sz but also found to progress to ESRD and has had dialysis initiated this hosp  Assessment/Plan: 1. sz-  Per primary team-  Anti sz meds as he will consent 2. ESRD- new diagnosis this hospital admit.  Have initiated HD-  Previously On MWF schedule via [place] initially to be not surgically stable for an AVF/AVG- but were able to get that placed 8/4.  Has spot at [facility] on TTS - second shift, running today on Thursday - sometimes there is difficulty starting new patients at OP clinics on Sat- would need to clarify with them if he can start there on 8/7-  Have a question into renal navigator to clarify  3. Anemia- iron repletion in progress-  Also ESA-   inc dose for hgb in the 8's  4. Secondary hyperparathyroidism-  PTH 160- calc /phos WNL- on no meds  5. HTN/volume-  Overall above goal but better-  Refusing meds at times-  On reg of norvasc 10, coreg 3.125 (may inc), then hydralazine and labetalol PRN 6. HIV-  Tivicay-  Also a skin biopsy pending for rash felt to be ID related  7. FTT-  Albumin under 3 and will need SNF placement at discharge   [place]: Basic Metabolic Panel: Recent Labs  Lab 04/20/20 0958 04/21/20 0258 04/21/20 2106 04/22/20 0222 04/24/20 0346 04/25/20 0304  04/26/20 2831  NA 137   < > 137   < > 138 136 138  K 3.4*   < > 4.3   < > 4.2 4.1 4.8  CL 99   < > 100   < > 102 99 101  CO2 27   < > 25   < > 25 26 25   GLUCOSE 120*   < > 139*   < > 177* 185* 114*  BUN 38*   < > 31*   < > 62* 35* 49*  CREATININE 5.77*   < > 5.63*   < > 9.03* 6.27* 7.57*  CALCIUM 8.4*   < > 8.3*   < > 8.5* 8.4* 8.8*  PHOS 3.9  --  2.7  --   --   --  5.1*   < > = values in this interval not displayed.   Liver Function Tests: Recent Labs  Lab 04/23/20 0130 04/23/20 0130 04/24/20 0346 04/25/20 0304 04/26/20 0638  AST 20  --  15 16  --   ALT 14  --  12 12  --   ALKPHOS 81  --  80 81  --   BILITOT 0.7  --  0.7 0.4  --   PROT 5.8*  --  5.9* 5.8*  --   ALBUMIN 2.5*   < > 2.4* 2.4* 2.5*   < > = values in this interval not displayed.   No results for input(s): LIPASE, AMYLASE in the last 168 hours. No results for input(s): AMMONIA in the last 168 hours. CBC: Recent Labs  Lab 04/22/20 0222 04/22/20 0222 04/23/20 0130 04/23/20 0130 04/24/20 0346 04/25/20 0304 04/26/20 0638  WBC 6.7   < > 7.0   < > 7.4 7.4 7.6  HGB 8.7*   < > 8.6*   < > 8.3* 8.6* 8.9*  HCT 26.4*   < > 26.2*   < > 25.8* 26.8* 27.3*  MCV 88.6  --  91.6  --  92.1 94.4 92.5  PLT 131*   < > 201   < > 221 184 205   < > = values in this interval not displayed.   Cardiac Enzymes: No results for input(s): CKTOTAL, CKMB, CKMBINDEX, TROPONINI in the last 168 hours. CBG: Recent Labs  Lab 04/25/20 1628 04/25/20 2009 04/26/20 0042 04/26/20 0431 04/26/20 0742  GLUCAP 194* 135* 130* 119* 100*    Iron Studies: No results for input(s): IRON, TIBC, TRANSFERRIN, FERRITIN in the last 72 hours. Studies/Results: No results found. Medications: Infusions: . ferric gluconate (FERRLECIT/NULECIT) IV Stopped (04/20/20 1419)    Scheduled Medications: . amLODipine  10 mg Oral Daily  . carvedilol  3.125 mg Oral BID WC  . Chlorhexidine Gluconate Cloth  6 each Topical Q0600  . clopidogrel  75 mg Oral Daily   . darbepoetin (ARANESP) injection - DIALYSIS  150 mcg Intravenous Q Thu-HD  . dolutegravir  50 mg Oral Daily  . feeding supplement (NEPRO CARB STEADY)  237 mL Oral TID BM  . heparin injection (subcutaneous)  5,000 Units Subcutaneous Q8H  . insulin aspart  0-9 Units Subcutaneous TID WC  . lamiVUDine  300 mg Oral Daily  . levETIRAcetam  500 mg Oral Q12H  . lidocaine-EPINEPHrine  20 mL Intradermal Once  . LORazepam  2 mg Intravenous Once  . multivitamin  1 tablet Oral QHS  . pantoprazole  40 mg Oral Daily  . pravastatin  80 mg Oral QHS  . sulfamethoxazole-trimethoprim  1 tablet Oral Once per day on Mon Wed Fri  . tamsulosin  0.4 mg Oral Daily    have reviewed scheduled and prn medications.  Physical Exam: General: sleeping in HD Heart: tachy Lungs: mostly clear Abdomen: soft, non tender Extremities: no edema-  Diffuse rash Dialysis Access: right sided TDC   And new left AVF-  Good thrill and bruit    04/26/2020,9:31 AM  LOS: 16 days

## 2020-04-26 NOTE — Procedures (Signed)
Patient was seen on dialysis and the procedure was supervised.  BFR 400  Via TDC BP is  112/64.   Patient appears to be tolerating treatment well  Louis Meckel 04/26/2020

## 2020-04-26 NOTE — Progress Notes (Signed)
[facility]  PROGRESS NOTE  Richard [name] PJA:[id number] DOB: [birth year] DOA: 04/10/2020 PCP: [name], MD   Brief HPI:   64 year-old male with medical history of diabetes mellitus, osteoarthritis, hypertension, CKD stage V, previous CVA with residual right-sided weakness, migraine headache, poor compliance, who had refused hemodialysis access in the past, came to ED s/p seizure.  Patient has history of seizures but has been noncompliant with seizure medications.  Seizure was witnessed by his roommate and lasted for 4 minutes. He was also found to have CVA along with seizure.  Neurology was consulted.  Started on antiepileptic drugs.  Vascular surgery was consulted for access placement as patient recently started hemodialysis for ESRD.  NG tube was removed on 28th July.  Currently on p.o. diet.  Continues to be intermittently noncompliant with medications.    Subjective   Patient seen and examined, came back from dialysis.  Denies any complaints.   Assessment/Plan:     1. Metabolic encephalopathy-improved, likely from seizure, hypertensive emergency, acute stroke, uremia.  Will continue to monitor. 2. Acute CVA-patient has history of CVA with residual right-sided weakness and chronic right upper extremity contracture.  CT scan on admission showed no acute finding.  Mental status changes were observed on morning of lateral dissection, code stroke was initiated.  Stat CT head showed no acute finding, MRI showed 2 mm acute to early subacute infarct within the left parietal lobe.  HIV screen was positive.  S/p lumbar puncture on 04/14/2020.  CSF Gram stain/culture was negative.  Cryptococcus PCR is negative.  VDRL CSF is negative.  Neurology was consulted and recommended to continue Plavix and statin.  Patient had core track tube which was removed and started on p.o. diet.  PT/OT was obtained, recommended skilled nursing facility placement. 3. Failure to thrive/moderate  calorie malnutrition-p.o. intake has improved.  Nutrition following.  Core track tube was removed and started on p.o. diet. 4. Recurrent seizure in setting of noncompliance-patient has history of noncompliance with seizure medications.  EEG on July 24 showed suggestive of moderate diffuse encephalopathy, nonspecific etiology.  No seizures or epileptiform discharges were seen throughout the recording.  S/p lumbar puncture on 04/14/2020.  CSF Gram stain was negative.  Cryptococcus PCR was negative.  VDRL CSF is negative.  He was started on IV Keppra and Vimpat.  Now only on Keppra as per neurology recommendation. 5. Hypertensive emergency-resolved, well controlled on amlodipine, carvedilol.  Continue to monitor patient blood pressure closely. 6. Anemia of chronic disease-hemoglobin is stable.  Follow CBC chronically. 7. HIV/AIDS-new diagnosis, CD4 count 39, viral load 6 24,000.  Infectious disease was consulted and started on Tivicay, Epivir.  Continue Bactrim 3 times a week for PCP prophylaxis. 8. Skin rash-patient had a biopsy from skin rash on 04/20/2020.  Will follow biopsy/culture report. 9. Diabetes mellitus type 2-continue sliding scale insulin with NovoLog.  Blood glucose well controlled.   Scheduled medications:   . amLODipine  10 mg Oral Daily  . carvedilol  3.125 mg Oral BID WC  . Chlorhexidine Gluconate Cloth  6 each Topical Q0600  . clopidogrel  75 mg Oral Daily  . darbepoetin (ARANESP) injection - DIALYSIS  150 mcg Intravenous Q Thu-HD  . dolutegravir  50 mg Oral Daily  . feeding supplement (NEPRO CARB STEADY)  237 mL Oral TID BM  . heparin injection (subcutaneous)  5,000 Units Subcutaneous Q8H  . heparin sodium (porcine)      . insulin aspart  0-9 Units Subcutaneous TID WC  .  lamiVUDine  300 mg Oral Daily  . levETIRAcetam  500 mg Oral Q12H  . lidocaine-EPINEPHrine  20 mL Intradermal Once  . LORazepam  2 mg Intravenous Once  . multivitamin  1 tablet Oral QHS  . pantoprazole  40 mg  Oral Daily  . pravastatin  80 mg Oral QHS  . sulfamethoxazole-trimethoprim  1 tablet Oral Once per day on Mon Wed Fri  . tamsulosin  0.4 mg Oral Daily         CBG: Recent Labs  Lab 04/25/20 2009 04/26/20 0042 04/26/20 0431 04/26/20 0742 04/26/20 1311  GLUCAP 135* 130* 119* 100* 96    SpO2: 100 % O2 Flow Rate (L/min): 2 L/min    CBC: Recent Labs  Lab 04/22/20 0222 04/23/20 0130 04/24/20 0346 04/25/20 0304 04/26/20 0638  WBC 6.7 7.0 7.4 7.4 7.6  HGB 8.7* 8.6* 8.3* 8.6* 8.9*  HCT 26.4* 26.2* 25.8* 26.8* 27.3*  MCV 88.6 91.6 92.1 94.4 92.5  PLT 131* 201 221 184 939    Basic Metabolic Panel: Recent Labs  Lab 04/20/20 0958 04/21/20 0258 04/21/20 2106 04/21/20 2106 04/22/20 0222 04/23/20 0130 04/24/20 0346 04/25/20 0304 04/26/20 0638  NA 137   < > 137   < > 136 136 138 136 138  K 3.4*   < > 4.3   < > 4.6 4.1 4.2 4.1 4.8  CL 99   < > 100   < > 100 101 102 99 101  CO2 27   < > 25   < > 23 25 25 26 25   GLUCOSE 120*   < > 139*   < > 94 152* 177* 185* 114*  BUN 38*   < > 31*   < > 36* 50* 62* 35* 49*  CREATININE 5.77*   < > 5.63*   < > 6.20* 7.80* 9.03* 6.27* 7.57*  CALCIUM 8.4*   < > 8.3*   < > 8.3* 8.3* 8.5* 8.4* 8.8*  PHOS 3.9  --  2.7  --   --   --   --   --  5.1*   < > = values in this interval not displayed.     Liver Function Tests: Recent Labs  Lab 04/21/20 0258 04/21/20 2106 04/22/20 0222 04/23/20 0130 04/24/20 0346 04/25/20 0304 04/26/20 0638  AST 27  --  23 20 15 16   --   ALT 16  --  15 14 12 12   --   ALKPHOS 76  --  82 81 80 81  --   BILITOT 0.5  --  0.2* 0.7 0.7 0.4  --   PROT 6.2*  --  6.0* 5.8* 5.9* 5.8*  --   ALBUMIN 2.4*   < > 2.5* 2.5* 2.4* 2.4* 2.5*   < > = values in this interval not displayed.     Antibiotics: Anti-infectives (From admission, onward)   Start     Dose/Rate Route Frequency Ordered Stop   04/25/20 0000  ceFAZolin (ANCEF) IVPB 2g/100 mL premix       Note to Pharmacy: Send with pt to OR   2 g 200 mL/hr  over 30 Minutes Intravenous On call 04/24/20 1021 04/25/20 0845   04/23/20 0000  sulfamethoxazole-trimethoprim (BACTRIM DS) 800-160 MG tablet     Discontinue     1 tablet Oral 3 times weekly 04/23/20 0918     04/19/20 1000  lamiVUDine (EPIVIR) tablet 300 mg     Discontinue    Note to Pharmacy: I DO NOT  renally dose lamivudine and I have never had problems NOT adjusting it. I want pt to be on Dovato but still not on formulary here (unfortunately)   300 mg Oral Daily 04/18/20 1043     04/17/20 0000  Dolutegravir-lamiVUDine (DOVATO) 50-300 MG TABS     Discontinue     1 tablet Oral Daily 04/17/20 1546     04/16/20 1000  lamiVUDine (EPIVIR) tablet 300 mg  Status:  Discontinued       Note to Pharmacy: I DO NOT renally dose lamivudine and I have never had problems NOT adjusting it. I want pt to be on Dovato but still not on formulary here (unfortunately)   300 mg Per Tube Daily 04/15/20 1307 04/18/20 1043   04/16/20 0900  sulfamethoxazole-trimethoprim (BACTRIM DS) 800-160 MG per tablet 1 tablet     Discontinue     1 tablet Oral Once per day on Mon Wed Fri 04/15/20 1350     04/15/20 1245  dolutegravir (TIVICAY) tablet 50 mg     Discontinue     50 mg Oral Daily 04/15/20 1202     04/15/20 1245  lamiVUDine (EPIVIR) tablet 300 mg  Status:  Discontinued       Note to Pharmacy: I DO NOT renally dose lamivudine and I have never had problems NOT adjusting it. I want pt to be on Dovato but still not on formulary here (unfortunately)   300 mg Oral Daily 04/15/20 1202 04/15/20 1307   04/13/20 1737  ceFAZolin (ANCEF) 2-4 GM/100ML-% IVPB       Note to Pharmacy: Desiree Hane   : cabinet override      04/13/20 1737 04/14/20 0544   04/13/20 0000  ceFAZolin (ANCEF) IVPB 2g/100 mL premix        2 g 200 mL/hr over 30 Minutes Intravenous To Radiology 04/12/20 1454 04/13/20 0834       DVT prophylaxis: Heparin  Code Status: Full code  Family Communication: No family at bedside    Status is:  Inpatient  Dispo: The patient is from: Home              Anticipated d/c is to: Skilled nursing facility              Anticipated d/c date is: 04/27/2020              Patient currently not medically stable for discharge  Barrier to discharge-patient is to sit in chair for hemodialysis, if he can sit in chair on Thursday he can be discharged to skilled nursing facility.     Consultants:  Vascular surgery  Nephrology  Procedures: AV fistula formation left arm   Objective   Vitals:   04/26/20 1100 04/26/20 1130 04/26/20 1158 04/26/20 1309  BP: 107/61 (!) 111/58 117/72 (!) 139/91  Pulse: 100 94 96 98  Resp: 15 14 11 19   Temp:   98.5 F (36.9 C) 98.4 F (36.9 C)  TempSrc:   Oral Oral  SpO2: 100% 100% 100% 100%  Weight:      Height:        Intake/Output Summary (Last 24 hours) at 04/26/2020 1353 Last data filed at 04/26/2020 1155 Gross per 24 hour  Intake 480 ml  Output 1747 ml  Net -1267 ml    08/03 1901 - 08/05 0700 In: 780 [P.O.:480; I.V.:300] Out: 511 [Urine:501]  Filed Weights   04/25/20 0320 04/26/20 0500  Weight: 72.1 kg 69.4 kg    Physical Examination:   General-appears in  no acute distress Heart-S1-S2, regular, no murmur auscultated Lungs-clear to auscultation bilaterally, no wheezing or crackles auscultated Abdomen-soft, nontender, no organomegaly Extremities-no edema in the lower extremities Neuro-alert, oriented x3, right hemiparesis   Data Reviewed:   Recent Results (from the past 240 hour(s))  Fungus Culture With Stain     Status: None (Preliminary result)   Collection Time: 04/20/20  2:08 PM   Specimen: Skin Biopsy  Result Value Ref Range Status   Fungus Stain Final report  Final    Comment: (NOTE) Performed At: [facility] [place], [place] [id number] Rush Farmer MD [id number]    Fungus (Mycology) Culture PENDING  Incomplete   Fungal Source SKIN  Final    Comment: BIOPSY Performed at [place], [place] [address] [place], [place] [address]   Acid Fast Smear (AFB)     Status: None   Collection Time: 04/20/20  2:08 PM   Specimen: Skin Biopsy  Result Value Ref Range Status   AFB Specimen Processing Comment  Final    Comment: Tissue Grinding and Digestion/Decontamination   Acid Fast Smear Negative  Final    Comment: (NOTE) Performed At: [facility] [place], [place] [id number] Rush Farmer MD [id number]    Source (AFB) SKIN  Final    Comment: BIOPSY Performed at [facility], [place] [address], [place], [place] [address]   Fungus Culture Result     Status: None   Collection Time: 04/20/20  2:08 PM  Result Value Ref Range Status   Result 1 Comment  Final    Comment: (NOTE) KOH/Calcofluor preparation:  no fungus observed. Performed At: [facility] [place], [place] [id number] Rush Farmer MD [id number]      ProBNP (last 3 results) Recent Labs    06/22/19 1505  PROBNP 170.0*      [place]   [facility] If 7PM-7AM, please contact night-coverage at [email or web address], Office  [phone]   04/26/2020, 1:53 PM  LOS: 16 days

## 2020-04-27 LAB — GLUCOSE, CAPILLARY
Glucose-Capillary: 184 mg/dL — ABNORMAL HIGH (ref 70–99)
Glucose-Capillary: 236 mg/dL — ABNORMAL HIGH (ref 70–99)
Glucose-Capillary: 185 mg/dL — ABNORMAL HIGH (ref 70–99)
Glucose-Capillary: 80 mg/dL (ref 70–99)
Glucose-Capillary: 184 mg/dL — ABNORMAL HIGH (ref 70–99)

## 2020-04-27 LAB — BASIC METABOLIC PANEL
Anion gap: 13 (ref 5–15)
BUN: 29 mg/dL — ABNORMAL HIGH (ref 8–23)
CO2: 26 mmol/L (ref 22–32)
Calcium: 8.3 mg/dL — ABNORMAL LOW (ref 8.9–10.3)
Chloride: 99 mmol/L (ref 98–111)
Creatinine, Ser: 5.25 mg/dL — ABNORMAL HIGH (ref 0.61–1.24)
GFR calc Af Amer: 13 mL/min — ABNORMAL LOW (ref >60)
GFR calc non Af Amer: 11 mL/min — ABNORMAL LOW (ref >60)
Glucose, Bld: 197 mg/dL — ABNORMAL HIGH (ref 70–99)
Potassium: 4.7 mmol/L (ref 3.5–5.1)
Sodium: 138 mmol/L (ref 135–145)

## 2020-04-27 MED ORDER — INSULIN GLARGINE 100 UNIT/ML ~~LOC~~ SOLN
5.0000 [IU] | Freq: Every day | SUBCUTANEOUS | Status: DC
  Administered 2020-04-27 – 2020-05-07 (×10): 5 [IU] via SUBCUTANEOUS
  Filled 2020-04-27 (×11): qty 0.05

## 2020-04-27 MED ORDER — CHLORHEXIDINE GLUCONATE CLOTH 2 % EX PADS
6.0000 | MEDICATED_PAD | Freq: Every day | CUTANEOUS | Status: DC
  Administered 2020-04-28 – 2020-05-07 (×9): 6 via TOPICAL

## 2020-04-27 NOTE — Progress Notes (Signed)
[facility]  PROGRESS NOTE  Richard [name] WLN:[id number] DOB: 1955-11-08 DOA: 04/10/2020 PCP: [name], MD   Brief HPI:   64 year-old male with medical history of diabetes mellitus, osteoarthritis, hypertension, CKD stage V, previous CVA with residual right-sided weakness, migraine headache, poor compliance, who had refused hemodialysis access in the past, came to ED s/p seizure.  Patient has history of seizures but has been noncompliant with seizure medications.  Seizure was witnessed by his roommate and lasted for 4 minutes. He was also found to have CVA along with seizure.  Neurology was consulted.  Started on antiepileptic drugs.  Vascular surgery was consulted for access placement as patient recently started hemodialysis for ESRD.  NG tube was removed on 28th July.  Currently on p.o. diet.  Continues to be intermittently noncompliant with medications.    Subjective   Patient seen and examined, denies any complaints.  He passed out briefly during hemodialysis session.  Blood pressure stable.   Assessment/Plan:     1. Metabolic encephalopathy-improved, likely from seizure, hypertensive emergency, acute stroke, uremia.  Will continue to monitor. 2. Acute CVA-patient has history of CVA with residual right-sided weakness and chronic right upper extremity contracture.  CT scan on admission showed no acute finding.  Mental status changes were observed on morning of lateral dissection, code stroke was initiated.  Stat CT head showed no acute finding, MRI showed 2 mm acute to early subacute infarct within the left parietal lobe.  HIV screen was positive.  S/p lumbar puncture on 04/14/2020.  CSF Gram stain/culture was negative.  Cryptococcus PCR is negative.  VDRL CSF is negative.  Neurology was consulted and recommended to continue Plavix and statin.  Patient had core track tube which was removed and started on p.o. diet.  PT/OT was obtained, recommended skilled nursing  facility placement. 3. Failure to thrive/moderate calorie malnutrition-p.o. intake has improved.  Nutrition following.  Cortrack tube was removed and started on p.o. diet. 4. Recurrent seizure in setting of noncompliance-patient has history of noncompliance with seizure medications.  EEG on July 24 showed suggestive of moderate diffuse encephalopathy, nonspecific etiology.  No seizures or epileptiform discharges were seen throughout the recording.  S/p lumbar puncture on 04/14/2020.  CSF Gram stain was negative.  Cryptococcus PCR was negative.  VDRL CSF is negative.  He was started on IV Keppra and Vimpat.  Now only on Keppra as per neurology recommendation. 5. ESRD-on hemodialysis, also had AV fistula made in left arm.  Patient passed out during hemodialysis session yesterday.  Discussed with nephrology, he does not appear to stable for outpatient hemodialysis at this time.  We will continue to monitor him during dialysis sessions while in the hospital. 6. Hypertensive emergency-resolved, well controlled on amlodipine, carvedilol.  Continue to monitor patient blood pressure closely. 7. Anemia of chronic disease-hemoglobin is stable.  Follow CBC chronically. 8. HIV/AIDS-new diagnosis, CD4 count 39, viral load 624,000.  Infectious disease was consulted and started on Tivicay, Epivir.  Continue Bactrim 3 times a week for PCP prophylaxis. 9. Skin rash-patient had a biopsy from skin rash on 04/20/2020.  Pathology consistent with Psoriasiform dermatitis. 10. Diabetes mellitus type 2-continue sliding scale insulin with NovoLog.  Blood glucose well controlled.   Scheduled medications:   . amLODipine  10 mg Oral Daily  . carvedilol  3.125 mg Oral BID WC  . Chlorhexidine Gluconate Cloth  6 each Topical Q0600  . [START ON 04/28/2020] Chlorhexidine Gluconate Cloth  6 each Topical Q0600  . clopidogrel  75 mg Oral Daily  . darbepoetin (ARANESP) injection - DIALYSIS  150 mcg Intravenous Q Thu-HD  . dolutegravir  50  mg Oral Daily  . feeding supplement (NEPRO CARB STEADY)  237 mL Oral TID BM  . heparin injection (subcutaneous)  5,000 Units Subcutaneous Q8H  . insulin aspart  0-9 Units Subcutaneous TID WC  . insulin glargine  5 Units Subcutaneous QHS  . lamiVUDine  300 mg Oral Daily  . levETIRAcetam  500 mg Oral Q12H  . lidocaine-EPINEPHrine  20 mL Intradermal Once  . LORazepam  2 mg Intravenous Once  . multivitamin  1 tablet Oral QHS  . pantoprazole  40 mg Oral Daily  . pravastatin  80 mg Oral QHS  . sulfamethoxazole-trimethoprim  1 tablet Oral Once per day on Mon Wed Fri  . tamsulosin  0.4 mg Oral Daily         CBG: Recent Labs  Lab 04/26/20 1955 04/26/20 2337 04/27/20 0438 04/27/20 0731 04/27/20 1131  GLUCAP 128* 238* 184* 236* 185*    SpO2: 100 % O2 Flow Rate (L/min): 2 L/min    CBC: Recent Labs  Lab 04/22/20 0222 04/23/20 0130 04/24/20 0346 04/25/20 0304 04/26/20 0638  WBC 6.7 7.0 7.4 7.4 7.6  HGB 8.7* 8.6* 8.3* 8.6* 8.9*  HCT 26.4* 26.2* 25.8* 26.8* 27.3*  MCV 88.6 91.6 92.1 94.4 92.5  PLT 131* 201 221 184 564    Basic Metabolic Panel: Recent Labs  Lab 04/21/20 2106 04/22/20 0222 04/23/20 0130 04/24/20 0346 04/25/20 0304 04/26/20 0638 04/27/20 0330  NA 137   < > 136 138 136 138 138  K 4.3   < > 4.1 4.2 4.1 4.8 4.7  CL 100   < > 101 102 99 101 99  CO2 25   < > 25 25 26 25 26   GLUCOSE 139*   < > 152* 177* 185* 114* 197*  BUN 31*   < > 50* 62* 35* 49* 29*  CREATININE 5.63*   < > 7.80* 9.03* 6.27* 7.57* 5.25*  CALCIUM 8.3*   < > 8.3* 8.5* 8.4* 8.8* 8.3*  PHOS 2.7  --   --   --   --  5.1*  --    < > = values in this interval not displayed.     Liver Function Tests: Recent Labs  Lab 04/21/20 0258 04/21/20 2106 04/22/20 0222 04/23/20 0130 04/24/20 0346 04/25/20 0304 04/26/20 0638  AST 27  --  23 20 15 16   --   ALT 16  --  15 14 12 12   --   ALKPHOS 76  --  82 81 80 81  --   BILITOT 0.5  --  0.2* 0.7 0.7 0.4  --   PROT 6.2*  --  6.0* 5.8* 5.9*  5.8*  --   ALBUMIN 2.4*   < > 2.5* 2.5* 2.4* 2.4* 2.5*   < > = values in this interval not displayed.     Antibiotics: Anti-infectives (From admission, onward)   Start     Dose/Rate Route Frequency Ordered Stop   04/25/20 0000  ceFAZolin (ANCEF) IVPB 2g/100 mL premix       Note to Pharmacy: Send with pt to OR   2 g 200 mL/hr over 30 Minutes Intravenous On call 04/24/20 1021 04/25/20 0845   04/23/20 0000  sulfamethoxazole-trimethoprim (BACTRIM DS) 800-160 MG tablet     Discontinue     1 tablet Oral 3 times weekly 04/23/20 0918     04/19/20  1000  lamiVUDine (EPIVIR) tablet 300 mg     Discontinue    Note to Pharmacy: I DO NOT renally dose lamivudine and I have never had problems NOT adjusting it. I want pt to be on Dovato but still not on formulary here (unfortunately)   300 mg Oral Daily 04/18/20 1043     04/17/20 0000  Dolutegravir-lamiVUDine (DOVATO) 50-300 MG TABS     Discontinue     1 tablet Oral Daily 04/17/20 1546     04/16/20 1000  lamiVUDine (EPIVIR) tablet 300 mg  Status:  Discontinued       Note to Pharmacy: I DO NOT renally dose lamivudine and I have never had problems NOT adjusting it. I want pt to be on Dovato but still not on formulary here (unfortunately)   300 mg Per Tube Daily 04/15/20 1307 04/18/20 1043   04/16/20 0900  sulfamethoxazole-trimethoprim (BACTRIM DS) 800-160 MG per tablet 1 tablet     Discontinue     1 tablet Oral Once per day on Mon Wed Fri 04/15/20 1350     04/15/20 1245  dolutegravir (TIVICAY) tablet 50 mg     Discontinue     50 mg Oral Daily 04/15/20 1202     04/15/20 1245  lamiVUDine (EPIVIR) tablet 300 mg  Status:  Discontinued       Note to Pharmacy: I DO NOT renally dose lamivudine and I have never had problems NOT adjusting it. I want pt to be on Dovato but still not on formulary here (unfortunately)   300 mg Oral Daily 04/15/20 1202 04/15/20 1307   04/13/20 1737  ceFAZolin (ANCEF) 2-4 GM/100ML-% IVPB       Note to Pharmacy: Desiree Hane   :  cabinet override      04/13/20 1737 04/14/20 0544   04/13/20 0000  ceFAZolin (ANCEF) IVPB 2g/100 mL premix        2 g 200 mL/hr over 30 Minutes Intravenous To Radiology 04/12/20 1454 04/13/20 0834       DVT prophylaxis: Heparin  Code Status: Full code  Family Communication: No family at bedside    Status is: Inpatient  Dispo: The patient is from: Home              Anticipated d/c is to: Skilled nursing facility              Anticipated d/c date is: 04/27/2020              Patient currently not medically stable for discharge  Barrier to discharge-patient is to sit in chair for hemodialysis, if he can sit in chair on Thursday he can be discharged to skilled nursing facility.     Consultants:  Vascular surgery  Nephrology  Procedures: AV fistula formation left arm   Objective   Vitals:   04/27/20 0500 04/27/20 0805 04/27/20 0818 04/27/20 1257  BP:  (!) 164/75 103/78 (!) 141/72  Pulse:  (!) 101 77 (!) 101  Resp:  16 18 16   Temp:  98.8 F (37.1 C) 98 F (36.7 C) 98.6 F (37 C)  TempSrc:  Oral Oral Oral  SpO2:  100% 97% 100%  Weight: 68.4 kg     Height:        Intake/Output Summary (Last 24 hours) at 04/27/2020 1343 Last data filed at 04/27/2020 0600 Gross per 24 hour  Intake 1320 ml  Output --  Net 1320 ml    08/04 1901 - 08/06 0700 In: 1560 [P.O.:1560] Out: 1746 [  Urine:400]  Filed Weights   04/26/20 0500 04/27/20 0500  Weight: 69.4 kg 68.4 kg    Physical Examination:  General-appears in no acute distress Heart-S1-S2, regular, no murmur auscultated Lungs-clear to auscultation bilaterally, no wheezing or crackles auscultated Abdomen-soft, nontender, no organomegaly Extremities-no edema in the lower extremities Neuro-alert, oriented x3, right hemiparesis   Data Reviewed:   Recent Results (from the past 240 hour(s))  Fungus Culture With Stain     Status: None (Preliminary result)   Collection Time: 04/20/20  2:08 PM   Specimen: Skin Biopsy   Result Value Ref Range Status   Fungus Stain Final report  Final    Comment: (NOTE) Performed At: [facility] [place], [place] [id number] Rush Farmer MD NW:[id number]    Fungus (Mycology) Culture PENDING  Incomplete   Fungal Source SKIN  Final    Comment: BIOPSY Performed at [facility], [place] [address], [place], [place] [address]   Acid Fast Smear (AFB)     Status: None   Collection Time: 04/20/20  2:08 PM   Specimen: Skin Biopsy  Result Value Ref Range Status   AFB Specimen Processing Comment  Final    Comment: Tissue Grinding and Digestion/Decontamination   Acid Fast Smear Negative  Final    Comment: (NOTE) Performed At: [facility] [place], [place] [id number] Rush Farmer MD LK:[id number]    Source (AFB) SKIN  Final    Comment: BIOPSY Performed at [facility], [place] [address] [place], [place] [address]   Fungus Culture Result     Status: None   Collection Time: 04/20/20  2:08 PM  Result Value Ref Range Status   Result 1 Comment  Final    Comment: (NOTE) KOH/Calcofluor preparation:  no fungus observed. Performed At: [facility] [place], [place] [id number] Rush Farmer MD LO:[id number]      ProBNP (last 3 results) Recent Labs    06/22/19 1505  PROBNP 170.0*      [place]   [facility] If 7PM-7AM, please contact night-coverage at [email or web address], Office  [phone]   04/27/2020, 1:43 PM  LOS: 17 days

## 2020-04-27 NOTE — Progress Notes (Signed)
Physical Therapy Treatment Patient Details Name: Richard [name] MRN: [id number] DOB: August 22, 1956 Today's Date: 04/27/2020    History of Present Illness 64 year old male with medical history of Arthritis, Diabetes mellitus, type II, insulin dependent, GERD, HTN, Immune deficiency disorder, migraines, neuropathy in diabetes, ruptured lumbar disc, ESRD stage V , GFR < 15 ml/min, declined  Dialysis in the past, CVA with residual right sided weakness, seizures admitted 7/20 through ED with seizure activity lasting 4 minutes per roommate, and encephalopathy. MRI of brain showed 2 mm infarct in left parietal lobe. A tunneled dialysis catheter was placed and  Dialysis was started on 7/23; New diagnosis this admission of HIV/AIDS     PT Comments    Patient received in bed, immediately shaking head no and stating he won't do therapy, when pressed as to why not he stated "I don't know", then able to convince him to try just a bit today. Worked on heel slides and attempted bridges (unsuccessful due to no initiation or effort on his part), also performed heel cord stretches. When pressed as to his preference to lay in bed and not do anything, he states that "its not good for me, I need to get stronger and go home" but when asked to do tasks he requires Max repeated multimodal cues and extended time, max levels of coaxing to participate and states "yes it'd be good for you all to move my legs"- education provided on importance of active motion and mobility instead of passive. He eventually stated "you all push and push but its not a good time for me", did not answer when asked when a good time is for therapy to come, just shook his head and would not initiate any more. Repositioned in bed with totalAx2 and left in bed with all needs met and alarm active. Continue to recommend SNF.   Follow Up Recommendations  SNF;Supervision/Assistance - 24 hour     Equipment Recommendations  None recommended by PT     Recommendations for Other Services       Precautions / Restrictions Precautions Precautions: Fall Precaution Comments: NG now removed Restrictions Weight Bearing Restrictions: No    Mobility  Bed Mobility               General bed mobility comments: refused  Transfers                 General transfer comment: Not able to progress to OOB mobility this date.   Ambulation/Gait             General Gait Details: unable   Stairs             Wheelchair Mobility    Modified Rankin (Stroke Patients Only)       Balance Overall balance assessment: Needs assistance                                          Cognition Arousal/Alertness: Awake/alert Behavior During Therapy: Flat affect Overall Cognitive Status: Impaired/Different from baseline Area of Impairment: Attention;Following commands;Safety/judgement;Awareness;Problem solving                   Current Attention Level: Sustained Memory: Decreased recall of precautions;Decreased short-term memory Following Commands: Follows one step commands inconsistently;Follows one step commands with increased time Safety/Judgement: Decreased awareness of safety;Decreased awareness of deficits Awareness: Intellectual Problem Solving: Slow processing;Decreased initiation;Difficulty sequencing;Requires  verbal cues;Requires tactile cues General Comments: awake and alert today but often only answered questions or commands from PT with head shakes or grunts, when he did verbally respond it was only 3-4 word comments      Exercises      General Comments General comments (skin integrity, edema, etc.): refused all mobility today      Pertinent Vitals/Pain Pain Assessment: Faces Pain Score: 0-No pain Faces Pain Scale: No hurt Pain Location: but reports he constantly has pain all over, behaviors not in line with this Pain Intervention(s): Limited activity within patient's  tolerance;Monitored during session    Home Living                      Prior Function            PT Goals (current goals can now be found in the care plan section) Acute Rehab PT Goals Patient Stated Goal: to get some rest PT Goal Formulation: With patient Time For Goal Achievement: 04/30/20 Potential to Achieve Goals: Fair Progress towards PT goals: Not progressing toward goals - comment (refused all EOB/OOB mobility)    Frequency    Min 2X/week      PT Plan Discharge plan needs to be updated    Co-evaluation              AM-PAC PT "6 Clicks" Mobility   Outcome Measure  Help needed turning from your back to your side while in a flat bed without using bedrails?: Total Help needed moving from lying on your back to sitting on the side of a flat bed without using bedrails?: Total Help needed moving to and from a bed to a chair (including a wheelchair)?: Total Help needed standing up from a chair using your arms (e.g., wheelchair or bedside chair)?: Total Help needed to walk in hospital room?: Total Help needed climbing 3-5 steps with a railing? : Total 6 Click Score: 6    End of Session   Activity Tolerance: Patient tolerated treatment well Patient left: in bed;with call bell/phone within reach;with bed alarm set   PT Visit Diagnosis: Muscle weakness (generalized) (M62.81);Difficulty in walking, not elsewhere classified (R26.2)     Time: 6067-7034 PT Time Calculation (min) (ACUTE ONLY): 20 min  Charges:  $Therapeutic Exercise: 8-22 mins                     Windell Norfolk, DPT, PN1   Supplemental Physical Therapist [place]    Pager [phone] Acute Rehab Office [phone]

## 2020-04-27 NOTE — Progress Notes (Signed)
Renal Navigator contacted patient's daughter/[name] to provide information on patient's OP HD clinic/seat time and request that she meet with patient at clinic on his first day to sign his consent for treatment. Patient's daughter agreeable.  Alphonzo Cruise, [place] Renal Navigator [phone]

## 2020-04-27 NOTE — TOC Progression Note (Signed)
Transition of Care [facility]) - Progression Note    Patient Details  Name: Richard [name] MRN: [id number] Date of Birth: [birth year]  Transition of Care [name]) CM/SW [place], [place] Phone Number: 04/27/2020, 10:36 AM  Clinical Narrative:   Insurance has been started with [facility] for when patient is stable to discharge to SNF. Noting that nephrology is still following for outpatient dialysis safety. CSW updated [facility] with barriers. CSW to follow.     Expected Discharge Plan: [place] Barriers to Discharge: Continued Medical Work up, Waiting for outpatient dialysis, Transportation, Ship broker  Expected Discharge Plan and Services Expected Discharge Plan: [place] Choice: [place] arrangements for the past 2 months: Single Family Home                                       Social Determinants of Health (SDOH) Interventions    Readmission Risk Interventions Readmission Risk Prevention Plan 02/15/2020  Transportation Screening Complete  PCP or Specialist Appt within 5-7 Days Not Complete  Not Complete comments pending disposition  Home Care Screening Complete  Medication Review (RN CM) Referral to Pharmacy  Some recent data might be hidden

## 2020-04-27 NOTE — Progress Notes (Signed)
Subjective:  Looks pretty good this AM-  Had episode in HD yesterday -  Became unresponsive but eventually did perk up -  BP was not terrible   Objective Vital signs in last 24 hours: Vitals:   04/27/20 0435 04/27/20 0500 04/27/20 0805 04/27/20 0818  BP: 137/80  (!) 164/75 103/78  Pulse: 96  (!) 101 77  Resp: 18  16 18   Temp: 98.6 F (37 C)  98.8 F (37.1 C) 98 F (36.7 C)  TempSrc: Oral  Oral Oral  SpO2: 100%  100% 97%  Weight:  68.4 kg    Height:       Weight change: -1 kg  Intake/Output Summary (Last 24 hours) at 04/27/2020 1010 Last data filed at 04/27/2020 0600 Gross per 24 hour  Intake 1320 ml  Output 1346 ml  Net -26 ml    Assessment/ Plan: Pt is a 64 y.o. yo male with HIV/SZ/advanced CKD as well as noncompliance who was admitted on 04/10/2020 with  sz but also found to progress to ESRD and has had dialysis initiated this hosp  Assessment/Plan: 1. sz-  Per primary team-  Anti sz meds as he will consent 2. ESRD- new diagnosis this hospital admit.  Have initiated HD-  Previously On MWF schedule via [place] initially to be not surgically stable for an AVF/AVG- but were able to get that placed 8/4.  Has spot at [facility] on TTS - second shift, running here now TTS schedule-  Had episode in HD yest-  Was told this was the second such episode-  I dont feel great about him starting at an OP unit on Sat -  Feel he might be too unstable-  Have d/w primary team-  Will keep him here and observe a little longer -  If cont to have episodes may even need to declare him unstable for OP HD. Plan for HD tomorrow here 3. Anemia- iron repletion in progress-  Also ESA-   inc dose for hgb in the 8's  4. Secondary hyperparathyroidism-  PTH 160- calc /phos WNL- on no meds  5. HTN/volume-   better-  Refusing meds at times-  On reg of norvasc 10, coreg 3.125 (may inc), then hydralazine and labetalol PRN 6. HIV-  Tivicay-  Also a skin biopsy pending for rash felt to be ID related  7. FTT-  Albumin  under 3 and will need SNF placement at discharge  8. Dispo-  Difficult situation-  Will need SNF at d/c-  Palliative speaking with family that have been hesitant to make decisions re dispo and DNR  Louis Meckel    Labs: Basic Metabolic Panel: Recent Labs  Lab 04/21/20 2106 04/22/20 0222 04/25/20 0304 04/26/20 0638 04/27/20 0330  NA 137   < > 136 138 138  K 4.3   < > 4.1 4.8 4.7  CL 100   < > 99 101 99  CO2 25   < > 26 25 26   GLUCOSE 139*   < > 185* 114* 197*  BUN 31*   < > 35* 49* 29*  CREATININE 5.63*   < > 6.27* 7.57* 5.25*  CALCIUM 8.3*   < > 8.4* 8.8* 8.3*  PHOS 2.7  --   --  5.1*  --    < > = values in this interval not displayed.   Liver Function Tests: Recent Labs  Lab 04/23/20 0130 04/23/20 0130 04/24/20 0346 04/25/20 0304 04/26/20 0638  AST 20  --  15 16  --   ALT 14  --  12 12  --   ALKPHOS 81  --  80 81  --   BILITOT 0.7  --  0.7 0.4  --   PROT 5.8*  --  5.9* 5.8*  --   ALBUMIN 2.5*   < > 2.4* 2.4* 2.5*   < > = values in this interval not displayed.   No results for input(s): LIPASE, AMYLASE in the last 168 hours. No results for input(s): AMMONIA in the last 168 hours. CBC: Recent Labs  Lab 04/22/20 0222 04/22/20 0222 04/23/20 0130 04/23/20 0130 04/24/20 0346 04/25/20 0304 04/26/20 0638  WBC 6.7   < > 7.0   < > 7.4 7.4 7.6  HGB 8.7*   < > 8.6*   < > 8.3* 8.6* 8.9*  HCT 26.4*   < > 26.2*   < > 25.8* 26.8* 27.3*  MCV 88.6  --  91.6  --  92.1 94.4 92.5  PLT 131*   < > 201   < > 221 184 205   < > = values in this interval not displayed.   Cardiac Enzymes: No results for input(s): CKTOTAL, CKMB, CKMBINDEX, TROPONINI in the last 168 hours. CBG: Recent Labs  Lab 04/26/20 1545 04/26/20 1955 04/26/20 2337 04/27/20 0438 04/27/20 0731  GLUCAP 88 128* 238* 184* 236*    Iron Studies: No results for input(s): IRON, TIBC, TRANSFERRIN, FERRITIN in the last 72 hours. Studies/Results: No results found. Medications: Infusions: . ferric  gluconate (FERRLECIT/NULECIT) IV Stopped (04/20/20 1419)    Scheduled Medications: . amLODipine  10 mg Oral Daily  . carvedilol  3.125 mg Oral BID WC  . Chlorhexidine Gluconate Cloth  6 each Topical Q0600  . clopidogrel  75 mg Oral Daily  . darbepoetin (ARANESP) injection - DIALYSIS  150 mcg Intravenous Q Thu-HD  . dolutegravir  50 mg Oral Daily  . feeding supplement (NEPRO CARB STEADY)  237 mL Oral TID BM  . heparin injection (subcutaneous)  5,000 Units Subcutaneous Q8H  . insulin aspart  0-9 Units Subcutaneous TID WC  . insulin glargine  5 Units Subcutaneous QHS  . lamiVUDine  300 mg Oral Daily  . levETIRAcetam  500 mg Oral Q12H  . lidocaine-EPINEPHrine  20 mL Intradermal Once  . LORazepam  2 mg Intravenous Once  . multivitamin  1 tablet Oral QHS  . pantoprazole  40 mg Oral Daily  . pravastatin  80 mg Oral QHS  . sulfamethoxazole-trimethoprim  1 tablet Oral Once per day on Mon Wed Fri  . tamsulosin  0.4 mg Oral Daily    have reviewed scheduled and prn medications.  Physical Exam: General: more alert this AM Heart: tachy Lungs: mostly clear Abdomen: soft, non tender Extremities: no edema-  Diffuse rash Dialysis Access: right sided TDC   And new left AVF-  Good thrill and bruit    04/27/2020,10:10 AM  LOS: 17 days

## 2020-04-28 LAB — CBC
HCT: 23.7 % — ABNORMAL LOW (ref 39.0–52.0)
Hemoglobin: 7.5 g/dL — ABNORMAL LOW (ref 13.0–17.0)
MCH: 30.1 pg (ref 26.0–34.0)
MCHC: 31.6 g/dL (ref 30.0–36.0)
MCV: 95.2 fL (ref 80.0–100.0)
Platelets: 185 10*3/uL (ref 150–400)
RBC: 2.49 MIL/uL — ABNORMAL LOW (ref 4.22–5.81)
RDW: 15.2 % (ref 11.5–15.5)
WBC: 6.8 10*3/uL (ref 4.0–10.5)
nRBC: 0 % (ref 0.0–0.2)

## 2020-04-28 LAB — RENAL FUNCTION PANEL
Albumin: 2.3 g/dL — ABNORMAL LOW (ref 3.5–5.0)
Anion gap: 12 (ref 5–15)
BUN: 42 mg/dL — ABNORMAL HIGH (ref 8–23)
CO2: 25 mmol/L (ref 22–32)
Calcium: 8.2 mg/dL — ABNORMAL LOW (ref 8.9–10.3)
Chloride: 100 mmol/L (ref 98–111)
Creatinine, Ser: 7.51 mg/dL — ABNORMAL HIGH (ref 0.61–1.24)
GFR calc Af Amer: 8 mL/min — ABNORMAL LOW (ref >60)
GFR calc non Af Amer: 7 mL/min — ABNORMAL LOW (ref >60)
Glucose, Bld: 164 mg/dL — ABNORMAL HIGH (ref 70–99)
Phosphorus: 3.3 mg/dL (ref 2.5–4.6)
Potassium: 4.4 mmol/L (ref 3.5–5.1)
Sodium: 137 mmol/L (ref 135–145)

## 2020-04-28 LAB — GLUCOSE, CAPILLARY
Glucose-Capillary: 110 mg/dL — ABNORMAL HIGH (ref 70–99)
Glucose-Capillary: 162 mg/dL — ABNORMAL HIGH (ref 70–99)
Glucose-Capillary: 168 mg/dL — ABNORMAL HIGH (ref 70–99)
Glucose-Capillary: 179 mg/dL — ABNORMAL HIGH (ref 70–99)
Glucose-Capillary: 225 mg/dL — ABNORMAL HIGH (ref 70–99)
Glucose-Capillary: 82 mg/dL (ref 70–99)

## 2020-04-28 MED ORDER — HEPARIN SODIUM (PORCINE) 1000 UNIT/ML IJ SOLN
INTRAMUSCULAR | Status: AC
  Administered 2020-04-28: 1000 [IU]
  Filled 2020-04-28: qty 4

## 2020-04-28 NOTE — Progress Notes (Signed)
HD report from [facility]  1 L removed B/[id number] 87 18  85hr 99ra  No issue noted or voice. Pt will be returning to room.

## 2020-04-28 NOTE — Progress Notes (Signed)
Subjective:  Resting after breakfast.... cleaned his tray-  Due for HD later today   Objective Vital signs in last 24 hours: Vitals:   04/28/20 0000 04/28/20 0327 04/28/20 0401 04/28/20 0843  BP: 129/69  (!) 143/61 (!) 162/65  Pulse: 88  82 94  Resp: 18  16 18   Temp: 97.8 F (36.6 C)  98.6 F (37 C) 98.2 F (36.8 C)  TempSrc: Oral  Oral Oral  SpO2: 100%  100% 98%  Weight:  72 kg    Height:       Weight change: 3.6 kg  Intake/Output Summary (Last 24 hours) at 04/28/2020 0950 Last data filed at 04/28/2020 0000 Gross per 24 hour  Intake 240 ml  Output --  Net 240 ml    Assessment/ Plan: Pt is a 64 y.o. yo male with HIV/SZ/advanced CKD as well as noncompliance who was admitted on 04/10/2020 with  sz but also found to progress to ESRD and has had dialysis initiated this hosp  Assessment/Plan: 1. sz-  Per primary team-  Anti sz meds as he will consent 2. ESRD- new diagnosis this hospital admit.  Have initiated HD-  Felt initially to be not surgically stable for an AVF/AVG- but were able to get that placed 8/4.  Has spot at [facility] on TTS - second shift, running here now TTS schedule-  Had episode in HD on Thurs-  Was told this was the second such episode-  I dont feel great about him starting at an OP unit on Sat -  Feel he might be too unstable-  Have d/w primary team-  Will keep him here and observe a little longer -  If cont to have episodes may even need to declare him unstable for OP HD. Plan for HD today here 3. Anemia- iron repletion in progress-  Also ESA-   inc dose for hgb in the 8's  4. Secondary hyperparathyroidism-  PTH 160- calc /phos WNL- on no meds  5. HTN/volume-   better-  Refusing meds at times-  On reg of norvasc 10, coreg 3.125 (may inc), then hydralazine and labetalol PRN 6. HIV-  Tivicay-   7. FTT-  Albumin under 3 and will need SNF placement at discharge  8. Dispo-  Difficult situation-  Will need SNF at d/c-  Palliative speaking with family that have been  hesitant to make decisions re dispo and DNR  Richard [name]    Labs: Basic Metabolic Panel: Recent Labs  Lab 04/21/20 2106 04/22/20 0222 04/25/20 0304 04/26/20 0638 04/27/20 0330  NA 137   < > 136 138 138  K 4.3   < > 4.1 4.8 4.7  CL 100   < > 99 101 99  CO2 25   < > 26 25 26   GLUCOSE 139*   < > 185* 114* 197*  BUN 31*   < > 35* 49* 29*  CREATININE 5.63*   < > 6.27* 7.57* 5.25*  CALCIUM 8.3*   < > 8.4* 8.8* 8.3*  PHOS 2.7  --   --  5.1*  --    < > = values in this interval not displayed.   Liver Function Tests: Recent Labs  Lab 04/23/20 0130 04/23/20 0130 04/24/20 0346 04/25/20 0304 04/26/20 0638  AST 20  --  15 16  --   ALT 14  --  12 12  --   ALKPHOS 81  --  80 81  --   BILITOT 0.7  --  0.7 0.4  --   PROT 5.8*  --  5.9* 5.8*  --   ALBUMIN 2.5*   < > 2.4* 2.4* 2.5*   < > = values in this interval not displayed.   No results for input(s): LIPASE, AMYLASE in the last 168 hours. No results for input(s): AMMONIA in the last 168 hours. CBC: Recent Labs  Lab 04/22/20 0222 04/22/20 0222 04/23/20 0130 04/23/20 0130 04/24/20 0346 04/25/20 0304 04/26/20 0638  WBC 6.7   < > 7.0   < > 7.4 7.4 7.6  HGB 8.7*   < > 8.6*   < > 8.3* 8.6* 8.9*  HCT 26.4*   < > 26.2*   < > 25.8* 26.8* 27.3*  MCV 88.6  --  91.6  --  92.1 94.4 92.5  PLT 131*   < > 201   < > 221 184 205   < > = values in this interval not displayed.   Cardiac Enzymes: No results for input(s): CKTOTAL, CKMB, CKMBINDEX, TROPONINI in the last 168 hours. CBG: Recent Labs  Lab 04/27/20 1522 04/27/20 1944 04/28/20 0002 04/28/20 0404 04/28/20 0725  GLUCAP 80 184* 179* 110* 82    Iron Studies: No results for input(s): IRON, TIBC, TRANSFERRIN, FERRITIN in the last 72 hours. Studies/Results: No results found. Medications: Infusions: . ferric gluconate (FERRLECIT/NULECIT) IV Stopped (04/20/20 1419)    Scheduled Medications: . amLODipine  10 mg Oral Daily  . carvedilol  3.125 mg Oral BID WC   . Chlorhexidine Gluconate Cloth  6 each Topical Q0600  . Chlorhexidine Gluconate Cloth  6 each Topical Q0600  . clopidogrel  75 mg Oral Daily  . darbepoetin (ARANESP) injection - DIALYSIS  150 mcg Intravenous Q Thu-HD  . dolutegravir  50 mg Oral Daily  . feeding supplement (NEPRO CARB STEADY)  237 mL Oral TID BM  . heparin injection (subcutaneous)  5,000 Units Subcutaneous Q8H  . insulin aspart  0-9 Units Subcutaneous TID WC  . insulin glargine  5 Units Subcutaneous QHS  . lamiVUDine  300 mg Oral Daily  . levETIRAcetam  500 mg Oral Q12H  . lidocaine-EPINEPHrine  20 mL Intradermal Once  . LORazepam  2 mg Intravenous Once  . multivitamin  1 tablet Oral QHS  . pantoprazole  40 mg Oral Daily  . pravastatin  80 mg Oral QHS  . sulfamethoxazole-trimethoprim  1 tablet Oral Once per day on Mon Wed Fri  . tamsulosin  0.4 mg Oral Daily    have reviewed scheduled and prn medications.  Physical Exam: General: less alert this AM Heart: tachy Lungs: mostly clear Abdomen: soft, non tender Extremities: no edema-  Diffuse rash Dialysis Access: right sided TDC   And new left AVF-  Good thrill and bruit    04/28/2020,9:50 AM  LOS: 18 days

## 2020-04-28 NOTE — Progress Notes (Signed)
[facility]  PROGRESS NOTE  Richard [name] DPO:[id number] DOB: April 16, 1956 DOA: 04/10/2020 PCP: [name], MD   Brief HPI:   64 year-old male with medical history of diabetes mellitus, osteoarthritis, hypertension, CKD stage V, previous CVA with residual right-sided weakness, migraine headache, poor compliance, who had refused hemodialysis access in the past, came to ED s/p seizure.  Patient has history of seizures but has been noncompliant with seizure medications.  Seizure was witnessed by his roommate and lasted for 4 minutes. He was also found to have CVA along with seizure.  Neurology was consulted.  Started on antiepileptic drugs.  Vascular surgery was consulted for access placement as patient recently started hemodialysis for ESRD.  NG tube was removed on 28th July.  Currently on p.o. diet.  Continues to be intermittently noncompliant with medications.    Subjective   Patient seen and examined, denies any complaints.  Has been noncompliant with his p.o. medications in the hospital.   Assessment/Plan:     1. Metabolic encephalopathy-improved, likely from seizure, hypertensive emergency, acute stroke, uremia.  Will continue to monitor. 2. Acute CVA-patient has history of CVA with residual right-sided weakness and chronic right upper extremity contracture.  CT scan on admission showed no acute finding.  Mental status changes were observed on morning of lateral dissection, code stroke was initiated.  Stat CT head showed no acute finding, MRI showed 2 mm acute to early subacute infarct within the left parietal lobe.  HIV screen was positive.  S/p lumbar puncture on 04/14/2020.  CSF Gram stain/culture was negative.  Cryptococcus PCR is negative.  VDRL CSF is negative.  Neurology was consulted and recommended to continue Plavix and statin.  Patient had core track tube which was removed and started on p.o. diet.  PT/OT was obtained, recommended skilled nursing facility  placement. 3. Failure to thrive/moderate calorie malnutrition-p.o. intake has improved.  Nutrition following.  Cortrack tube was removed and started on p.o. diet. 4. Recurrent seizure in setting of noncompliance-patient has history of noncompliance with seizure medications.  EEG on July 24 showed suggestive of moderate diffuse encephalopathy, nonspecific etiology.  No seizures or epileptiform discharges were seen throughout the recording.  S/p lumbar puncture on 04/14/2020.  CSF Gram stain was negative.  Cryptococcus PCR was negative.  VDRL CSF is negative.  He was started on IV Keppra and Vimpat.  Now only on Keppra as per neurology recommendation. 5. ESRD-on hemodialysis, also had AV fistula made in left arm.  Patient passed out during hemodialysis session yesterday.  Discussed with nephrology, he does not appear to stable for outpatient hemodialysis at this time.  We will continue to monitor him during dialysis sessions while in the hospital. 6. Hypertensive emergency-resolved, well controlled on amlodipine, carvedilol.  Continue to monitor patient blood pressure closely. 7. Anemia of chronic disease-hemoglobin is stable.  Follow CBC chronically. 8. HIV/AIDS-new diagnosis, CD4 count 39, viral load 624,000.  Infectious disease was consulted and started on Tivicay, Epivir.  Continue Bactrim 3 times a week for PCP prophylaxis. 9. Skin rash-patient had a biopsy from skin rash on 04/20/2020.  Pathology consistent with Psoriasiform dermatitis. 10. Diabetes mellitus type 2-continue sliding scale insulin with NovoLog.  Blood glucose well controlled.   Scheduled medications:   . amLODipine  10 mg Oral Daily  . carvedilol  3.125 mg Oral BID WC  . Chlorhexidine Gluconate Cloth  6 each Topical Q0600  . Chlorhexidine Gluconate Cloth  6 each Topical Q0600  . clopidogrel  75 mg Oral  Daily  . darbepoetin (ARANESP) injection - DIALYSIS  150 mcg Intravenous Q Thu-HD  . dolutegravir  50 mg Oral Daily  . feeding  supplement (NEPRO CARB STEADY)  237 mL Oral TID BM  . heparin injection (subcutaneous)  5,000 Units Subcutaneous Q8H  . insulin aspart  0-9 Units Subcutaneous TID WC  . insulin glargine  5 Units Subcutaneous QHS  . lamiVUDine  300 mg Oral Daily  . levETIRAcetam  500 mg Oral Q12H  . lidocaine-EPINEPHrine  20 mL Intradermal Once  . LORazepam  2 mg Intravenous Once  . multivitamin  1 tablet Oral QHS  . pantoprazole  40 mg Oral Daily  . pravastatin  80 mg Oral QHS  . sulfamethoxazole-trimethoprim  1 tablet Oral Once per day on Mon Wed Fri  . tamsulosin  0.4 mg Oral Daily         CBG: Recent Labs  Lab 04/27/20 1522 04/27/20 1944 04/28/20 0002 04/28/20 0404 04/28/20 0725  GLUCAP 80 184* 179* 110* 82    SpO2: 98 % O2 Flow Rate (L/min): 2 L/min    CBC: Recent Labs  Lab 04/22/20 0222 04/23/20 0130 04/24/20 0346 04/25/20 0304 04/26/20 0638  WBC 6.7 7.0 7.4 7.4 7.6  HGB 8.7* 8.6* 8.3* 8.6* 8.9*  HCT 26.4* 26.2* 25.8* 26.8* 27.3*  MCV 88.6 91.6 92.1 94.4 92.5  PLT 131* 201 221 184 333    Basic Metabolic Panel: Recent Labs  Lab 04/21/20 2106 04/22/20 0222 04/23/20 0130 04/24/20 0346 04/25/20 0304 04/26/20 0638 04/27/20 0330  NA 137   < > 136 138 136 138 138  K 4.3   < > 4.1 4.2 4.1 4.8 4.7  CL 100   < > 101 102 99 101 99  CO2 25   < > 25 25 26 25 26   GLUCOSE 139*   < > 152* 177* 185* 114* 197*  BUN 31*   < > 50* 62* 35* 49* 29*  CREATININE 5.63*   < > 7.80* 9.03* 6.27* 7.57* 5.25*  CALCIUM 8.3*   < > 8.3* 8.5* 8.4* 8.8* 8.3*  PHOS 2.7  --   --   --   --  5.1*  --    < > = values in this interval not displayed.     Liver Function Tests: Recent Labs  Lab 04/22/20 0222 04/23/20 0130 04/24/20 0346 04/25/20 0304 04/26/20 5456  AST 23 20 15 16   --   ALT 15 14 12 12   --   ALKPHOS 82 81 80 81  --   BILITOT 0.2* 0.7 0.7 0.4  --   PROT 6.0* 5.8* 5.9* 5.8*  --   ALBUMIN 2.5* 2.5* 2.4* 2.4* 2.5*     Antibiotics: Anti-infectives (From admission, onward)    Start     Dose/Rate Route Frequency Ordered Stop   04/25/20 0000  ceFAZolin (ANCEF) IVPB 2g/100 mL premix       Note to Pharmacy: Send with pt to OR   2 g 200 mL/hr over 30 Minutes Intravenous On call 04/24/20 1021 04/25/20 0845   04/23/20 0000  sulfamethoxazole-trimethoprim (BACTRIM DS) 800-160 MG tablet     Discontinue     1 tablet Oral 3 times weekly 04/23/20 0918     04/19/20 1000  lamiVUDine (EPIVIR) tablet 300 mg     Discontinue    Note to Pharmacy: I DO NOT renally dose lamivudine and I have never had problems NOT adjusting it. I want pt to be on Dovato but still not  on formulary here (unfortunately)   300 mg Oral Daily 04/18/20 1043     04/17/20 0000  Dolutegravir-lamiVUDine (DOVATO) 50-300 MG TABS     Discontinue     1 tablet Oral Daily 04/17/20 1546     04/16/20 1000  lamiVUDine (EPIVIR) tablet 300 mg  Status:  Discontinued       Note to Pharmacy: I DO NOT renally dose lamivudine and I have never had problems NOT adjusting it. I want pt to be on Dovato but still not on formulary here (unfortunately)   300 mg Per Tube Daily 04/15/20 1307 04/18/20 1043   04/16/20 0900  sulfamethoxazole-trimethoprim (BACTRIM DS) 800-160 MG per tablet 1 tablet     Discontinue     1 tablet Oral Once per day on Mon Wed Fri 04/15/20 1350     04/15/20 1245  dolutegravir (TIVICAY) tablet 50 mg     Discontinue     50 mg Oral Daily 04/15/20 1202     04/15/20 1245  lamiVUDine (EPIVIR) tablet 300 mg  Status:  Discontinued       Note to Pharmacy: I DO NOT renally dose lamivudine and I have never had problems NOT adjusting it. I want pt to be on Dovato but still not on formulary here (unfortunately)   300 mg Oral Daily 04/15/20 1202 04/15/20 1307   04/13/20 1737  ceFAZolin (ANCEF) 2-4 GM/100ML-% IVPB       Note to Pharmacy: Desiree Hane   : cabinet override      04/13/20 1737 04/14/20 0544   04/13/20 0000  ceFAZolin (ANCEF) IVPB 2g/100 mL premix        2 g 200 mL/hr over 30 Minutes Intravenous To  Radiology 04/12/20 1454 04/13/20 0834       DVT prophylaxis: Heparin  Code Status: Full code  Family Communication: No family at bedside    Status is: Inpatient  Dispo: The patient is from: Home              Anticipated d/c is to: Skilled nursing facility              Anticipated d/c date is: 04/27/2020              Patient currently not medically stable for discharge  Barrier to discharge-patient is to sit in chair for hemodialysis, if he can sit in chair on Thursday he can be discharged to skilled nursing facility.     Consultants:  Vascular surgery  Nephrology  Procedures: AV fistula formation left arm   Objective   Vitals:   04/28/20 0000 04/28/20 0327 04/28/20 0401 04/28/20 0843  BP: 129/69  (!) 143/61 (!) 162/65  Pulse: 88  82 94  Resp: 18  16 18   Temp: 97.8 F (36.6 C)  98.6 F (37 C) 98.2 F (36.8 C)  TempSrc: Oral  Oral Oral  SpO2: 100%  100% 98%  Weight:  72 kg    Height:        Intake/Output Summary (Last 24 hours) at 04/28/2020 0844 Last data filed at 04/28/2020 0000 Gross per 24 hour  Intake 240 ml  Output --  Net 240 ml    08/05 1901 - 08/07 0700 In: 4403 [P.O.:1560] Out: -   Filed Weights   04/26/20 0500 04/27/20 0500 04/28/20 0327  Weight: 69.4 kg 68.4 kg 72 kg    Physical Examination:  General-appears in no acute distress Heart-S1-S2, regular, no murmur auscultated Lungs-clear to auscultation bilaterally, no wheezing or crackles auscultated  Abdomen-soft, nontender, no organomegaly Extremities-no edema in the lower extremities Neuro-alert, oriented x3, right hemiparesis   Data Reviewed:   Recent Results (from the past 240 hour(s))  Fungus Culture With Stain     Status: None (Preliminary result)   Collection Time: 04/20/20  2:08 PM   Specimen: Skin Biopsy  Result Value Ref Range Status   Fungus Stain Final report  Final    Comment: (NOTE) Performed At: [facility] [place], [place]  [id number] Rush Farmer MD [id number]    Fungus (Mycology) Culture PENDING  Incomplete   Fungal Source SKIN  Final    Comment: BIOPSY Performed at [facility], [place] [address], [place], [place] [address]   Acid Fast Smear (AFB)     Status: None   Collection Time: 04/20/20  2:08 PM   Specimen: Skin Biopsy  Result Value Ref Range Status   AFB Specimen Processing Comment  Final    Comment: Tissue Grinding and Digestion/Decontamination   Acid Fast Smear Negative  Final    Comment: (NOTE) Performed At: [facility] [place], [place] [id number] Rush Farmer MD [id number]    Source (AFB) SKIN  Final    Comment: BIOPSY Performed at [facility], [place] [address] [place], [place] [address]   Fungus Culture Result     Status: None   Collection Time: 04/20/20  2:08 PM  Result Value Ref Range Status   Result 1 Comment  Final    Comment: (NOTE) KOH/Calcofluor preparation:  no fungus observed. Performed At: [facility] [place], [place] [id number] Rush Farmer MD [id number]      ProBNP (last 3 results) Recent Labs    06/22/19 1505  PROBNP 170.0*      [place]   [facility] If 7PM-7AM, please contact night-coverage at [email or web address], Office  [phone]   04/28/2020, 8:44 AM  LOS: 18 days

## 2020-04-28 NOTE — Progress Notes (Signed)
Hydrologist [facility])  Hospital Liaison: RN note         Notified by [facility] manager of patient/family request for [facility] Palliative services at home after discharge.         Writer spoke with patient to confirm interest and explain services.              Please call with any hospice or palliative related questions.         Thank you for this referral.         Farrel Gordon, RN, CCM  [facility] (listed on [employer] under Hosp[facility])    [phone]

## 2020-04-29 LAB — GLUCOSE, CAPILLARY
Glucose-Capillary: 121 mg/dL — ABNORMAL HIGH (ref 70–99)
Glucose-Capillary: 117 mg/dL — ABNORMAL HIGH (ref 70–99)
Glucose-Capillary: 178 mg/dL — ABNORMAL HIGH (ref 70–99)
Glucose-Capillary: 127 mg/dL — ABNORMAL HIGH (ref 70–99)
Glucose-Capillary: 113 mg/dL — ABNORMAL HIGH (ref 70–99)
Glucose-Capillary: 172 mg/dL — ABNORMAL HIGH (ref 70–99)

## 2020-04-29 MED ORDER — BICTEGRAVIR-EMTRICITAB-TENOFOV 50-200-25 MG PO TABS
1.0000 | ORAL_TABLET | Freq: Every day | ORAL | Status: DC
  Administered 2020-04-29 – 2020-05-07 (×9): 1 via ORAL
  Filled 2020-04-29 (×9): qty 1

## 2020-04-29 NOTE — Progress Notes (Signed)
Subjective:  HD yest-  Removed 1000-  Tolerated well - one BP was low but the rest OK-  No episodes. "im still hungry after breakfast"  "im ready to go home"  Planning for SNF  Objective Vital signs in last 24 hours: Vitals:   04/28/20 1943 04/28/20 2308 04/29/20 0309 04/29/20 0412  BP: (!) 137/42 (!) 148/77 140/67   Pulse: 88 97 88   Resp: 18 17 16    Temp: 99.1 F (37.3 C) 99.4 F (37.4 C) 98.5 F (36.9 C)   TempSrc: Oral Oral Oral   SpO2: 100% 100% 100%   Weight:    74 kg  Height:       Weight change: 1 kg  Intake/Output Summary (Last 24 hours) at 04/29/2020 0932 Last data filed at 04/29/2020 0300 Gross per 24 hour  Intake 1030 ml  Output 1250 ml  Net -220 ml    Assessment/ Plan: Pt is a 64 y.o. yo male with HIV/SZ/advanced CKD as well as noncompliance who was admitted on 04/10/2020 with  sz but also found to progress to ESRD and has had dialysis initiated this hosp  Assessment/Plan: 1. sz-  Per primary team-  Anti sz meds as he will consent 2. ESRD- new diagnosis this hospital admit.  Have initiated HD- AVF placed 8/4.  Has spot at [facility] on TTS - second shift, running here now TTS schedule-  Had episode in HD on Thurs-  Was told this was the second such episode-  I didnt feel great about him starting at an OP unit on Sat -  HD here went well on Saturday so feel better about his stability at OP center so feel like is OK to start planning for d/c to SNF 3. Anemia- iron repletion in progress-  Also ESA-   inc dose for hgb in the 8's  4. Secondary hyperparathyroidism-  PTH 160- calc /phos WNL- on no meds  5. HTN/volume-   better-  Refusing meds at times-  On reg of norvasc 10, coreg 3.125 (may inc), then hydralazine and labetalol PRN (has not needed) 6. HIV-  Tivicay-   7. FTT-  Albumin under 3 and will need SNF placement at discharge  8. Dispo-  Difficult situation-  Will need SNF at d/c-  Palliative speaking with family that have been hesitant to make decisions re dispo and DNR-   Seems will be followed by palliative at discharge  [place]: Basic Metabolic Panel: Recent Labs  Lab 04/26/20 0638 04/27/20 0330 04/28/20 1422  NA 138 138 137  K 4.8 4.7 4.4  CL 101 99 100  CO2 25 26 25   GLUCOSE 114* 197* 164*  BUN 49* 29* 42*  CREATININE 7.57* 5.25* 7.51*  CALCIUM 8.8* 8.3* 8.2*  PHOS 5.1*  --  3.3   Liver Function Tests: Recent Labs  Lab 04/23/20 0130 04/23/20 0130 04/24/20 0346 04/24/20 0346 04/25/20 0304 04/26/20 0638 04/28/20 1422  AST 20  --  15  --  16  --   --   ALT 14  --  12  --  12  --   --   ALKPHOS 81  --  80  --  81  --   --   BILITOT 0.7  --  0.7  --  0.4  --   --   PROT 5.8*  --  5.9*  --  5.8*  --   --   ALBUMIN 2.5*   < > 2.4*   < >  2.4* 2.5* 2.3*   < > = values in this interval not displayed.   No results for input(s): LIPASE, AMYLASE in the last 168 hours. No results for input(s): AMMONIA in the last 168 hours. CBC: Recent Labs  Lab 04/23/20 0130 04/23/20 0130 04/24/20 0346 04/24/20 0346 04/25/20 0304 04/26/20 0638 04/28/20 1422  WBC 7.0   < > 7.4   < > 7.4 7.6 6.8  HGB 8.6*   < > 8.3*   < > 8.6* 8.9* 7.5*  HCT 26.2*   < > 25.8*   < > 26.8* 27.3* 23.7*  MCV 91.6  --  92.1  --  94.4 92.5 95.2  PLT 201   < > 221   < > 184 205 185   < > = values in this interval not displayed.   Cardiac Enzymes: No results for input(s): CKTOTAL, CKMB, CKMBINDEX, TROPONINI in the last 168 hours. CBG: Recent Labs  Lab 04/28/20 0725 04/28/20 1130 04/28/20 1941 04/28/20 2307 04/29/20 0306  GLUCAP 82 168* 162* 225* 121*    Iron Studies: No results for input(s): IRON, TIBC, TRANSFERRIN, FERRITIN in the last 72 hours. Studies/Results: No results found. Medications: Infusions: . ferric gluconate (FERRLECIT/NULECIT) IV Stopped (04/20/20 1419)    Scheduled Medications: . amLODipine  10 mg Oral Daily  . carvedilol  3.125 mg Oral BID WC  . Chlorhexidine Gluconate Cloth  6 each Topical Q0600  . Chlorhexidine  Gluconate Cloth  6 each Topical Q0600  . clopidogrel  75 mg Oral Daily  . darbepoetin (ARANESP) injection - DIALYSIS  150 mcg Intravenous Q Thu-HD  . dolutegravir  50 mg Oral Daily  . feeding supplement (NEPRO CARB STEADY)  237 mL Oral TID BM  . heparin injection (subcutaneous)  5,000 Units Subcutaneous Q8H  . insulin aspart  0-9 Units Subcutaneous TID WC  . insulin glargine  5 Units Subcutaneous QHS  . lamiVUDine  300 mg Oral Daily  . levETIRAcetam  500 mg Oral Q12H  . lidocaine-EPINEPHrine  20 mL Intradermal Once  . LORazepam  2 mg Intravenous Once  . multivitamin  1 tablet Oral QHS  . pantoprazole  40 mg Oral Daily  . pravastatin  80 mg Oral QHS  . sulfamethoxazole-trimethoprim  1 tablet Oral Once per day on Mon Wed Fri  . tamsulosin  0.4 mg Oral Daily    have reviewed scheduled and prn medications.  Physical Exam: General: alert, talkative  Heart: tachy Lungs: mostly clear Abdomen: soft, non tender Extremities: no edema-  Diffuse rash Dialysis Access: right sided TDC    new left AVF-  Good thrill and bruit    04/29/2020,8:38 AM  LOS: 19 days

## 2020-04-29 NOTE — Plan of Care (Signed)

## 2020-04-29 NOTE — Progress Notes (Signed)
[facility]  PROGRESS NOTE  Wayman Hoard ZOX:[id number] DOB: Dec 14, 1955 DOA: 04/10/2020 PCP: [name], MD   Brief HPI:   64 year-old male with medical history of diabetes mellitus, osteoarthritis, hypertension, CKD stage V, previous CVA with residual right-sided weakness, migraine headache, poor compliance, who had refused hemodialysis access in the past, came to ED s/p seizure.  Patient has history of seizures but has been noncompliant with seizure medications.  Seizure was witnessed by his roommate and lasted for 4 minutes. He was also found to have CVA along with seizure.  Neurology was consulted.  Started on antiepileptic drugs.  Vascular surgery was consulted for access placement as patient recently started hemodialysis for ESRD.  NG tube was removed on 28th July.  Currently on p.o. diet.  Continues to be intermittently noncompliant with medications.    Subjective  Patient seen and examined, denies any complaints.  Tolerated dialysis session sitting in chair.   Assessment/Plan:     1. Metabolic encephalopathy-improved, likely from seizure, hypertensive emergency, acute stroke, uremia.  Will continue to monitor. 2. Acute CVA-patient has history of CVA with residual right-sided weakness and chronic right upper extremity contracture.  CT scan on admission showed no acute finding.  Mental status changes were observed on morning of lateral dissection, code stroke was initiated.  Stat CT head showed no acute finding, MRI showed 2 mm acute to early subacute infarct within the left parietal lobe.  HIV screen was positive.  S/p lumbar puncture on 04/14/2020.  CSF Gram stain/culture was negative.  Cryptococcus PCR is negative.  VDRL CSF is negative.  Neurology was consulted and recommended to continue Plavix and statin.  Patient had core track tube which was removed and started on p.o. diet.  PT/OT was obtained, recommended skilled nursing facility placement. 3. Failure to  thrive/moderate calorie malnutrition-p.o. intake has improved.  Nutrition following.  Cortrack tube was removed and started on p.o. diet. 4. Recurrent seizure in setting of noncompliance-patient has history of noncompliance with seizure medications.  EEG on July 24 showed suggestive of moderate diffuse encephalopathy, nonspecific etiology.  No seizures or epileptiform discharges were seen throughout the recording.  S/p lumbar puncture on 04/14/2020.  CSF Gram stain was negative.  Cryptococcus PCR was negative.  VDRL CSF is negative.  He was started on IV Keppra and Vimpat.  Now only on Keppra as per neurology recommendation. 5. ESRD-on hemodialysis, also had AV fistula made in left arm.  Patient passed out during hemodialysis session yesterday.  As per nephrology note, patient tolerated dialysis session well while sitting in chair.  Will start planning for discharge to skilled nursing facility.  6. Hypertensive emergency-resolved, well controlled on amlodipine, carvedilol.  Continue to monitor patient blood pressure closely. 7. Anemia of chronic disease-hemoglobin is stable.  Follow CBC chronically. 8. HIV/AIDS-new diagnosis, CD4 count 39, viral load 624,000.  Infectious disease was consulted and started on Tivicay, Epivir.  Continue Bactrim 3 times a week for PCP prophylaxis. 9. Skin rash-patient had a biopsy from skin rash on 04/20/2020.  Pathology consistent with Psoriasiform dermatitis. 10. Diabetes mellitus type 2-continue sliding scale insulin with NovoLog.  Blood glucose well controlled.   Scheduled medications:   . amLODipine  10 mg Oral Daily  . carvedilol  3.125 mg Oral BID WC  . Chlorhexidine Gluconate Cloth  6 each Topical Q0600  . Chlorhexidine Gluconate Cloth  6 each Topical Q0600  . clopidogrel  75 mg Oral Daily  . darbepoetin (ARANESP) injection - DIALYSIS  150  mcg Intravenous Q Thu-HD  . dolutegravir  50 mg Oral Daily  . feeding supplement (NEPRO CARB STEADY)  237 mL Oral TID BM  .  heparin injection (subcutaneous)  5,000 Units Subcutaneous Q8H  . insulin aspart  0-9 Units Subcutaneous TID WC  . insulin glargine  5 Units Subcutaneous QHS  . lamiVUDine  300 mg Oral Daily  . levETIRAcetam  500 mg Oral Q12H  . lidocaine-EPINEPHrine  20 mL Intradermal Once  . LORazepam  2 mg Intravenous Once  . multivitamin  1 tablet Oral QHS  . pantoprazole  40 mg Oral Daily  . pravastatin  80 mg Oral QHS  . sulfamethoxazole-trimethoprim  1 tablet Oral Once per day on Mon Wed Fri  . tamsulosin  0.4 mg Oral Daily         CBG: Recent Labs  Lab 04/28/20 1130 04/28/20 1941 04/28/20 2307 04/29/20 0306 04/29/20 0838  GLUCAP 168* 162* 225* 121* 117*    SpO2: 100 % O2 Flow Rate (L/min): 2 L/min    CBC: Recent Labs  Lab 04/23/20 0130 04/24/20 0346 04/25/20 0304 04/26/20 0638 04/28/20 1422  WBC 7.0 7.4 7.4 7.6 6.8  HGB 8.6* 8.3* 8.6* 8.9* 7.5*  HCT 26.2* 25.8* 26.8* 27.3* 23.7*  MCV 91.6 92.1 94.4 92.5 95.2  PLT 201 221 184 205 607    Basic Metabolic Panel: Recent Labs  Lab 04/24/20 0346 04/25/20 0304 04/26/20 0638 04/27/20 0330 04/28/20 1422  NA 138 136 138 138 137  K 4.2 4.1 4.8 4.7 4.4  CL 102 99 101 99 100  CO2 25 26 25 26 25   GLUCOSE 177* 185* 114* 197* 164*  BUN 62* 35* 49* 29* 42*  CREATININE 9.03* 6.27* 7.57* 5.25* 7.51*  CALCIUM 8.5* 8.4* 8.8* 8.3* 8.2*  PHOS  --   --  5.1*  --  3.3     Liver Function Tests: Recent Labs  Lab 04/23/20 0130 04/24/20 0346 04/25/20 0304 04/26/20 0638 04/28/20 1422  AST 20 15 16   --   --   ALT 14 12 12   --   --   ALKPHOS 81 80 81  --   --   BILITOT 0.7 0.7 0.4  --   --   PROT 5.8* 5.9* 5.8*  --   --   ALBUMIN 2.5* 2.4* 2.4* 2.5* 2.3*     Antibiotics: Anti-infectives (From admission, onward)   Start     Dose/Rate Route Frequency Ordered Stop   04/25/20 0000  ceFAZolin (ANCEF) IVPB 2g/100 mL premix       Note to Pharmacy: Send with pt to OR   2 g 200 mL/hr over 30 Minutes Intravenous On call  04/24/20 1021 04/25/20 0845   04/23/20 0000  sulfamethoxazole-trimethoprim (BACTRIM DS) 800-160 MG tablet     Discontinue     1 tablet Oral 3 times weekly 04/23/20 0918     04/19/20 1000  lamiVUDine (EPIVIR) tablet 300 mg     Discontinue    Note to Pharmacy: I DO NOT renally dose lamivudine and I have never had problems NOT adjusting it. I want pt to be on Dovato but still not on formulary here (unfortunately)   300 mg Oral Daily 04/18/20 1043     04/17/20 0000  Dolutegravir-lamiVUDine (DOVATO) 50-300 MG TABS     Discontinue     1 tablet Oral Daily 04/17/20 1546     04/16/20 1000  lamiVUDine (EPIVIR) tablet 300 mg  Status:  Discontinued  Note to Pharmacy: I DO NOT renally dose lamivudine and I have never had problems NOT adjusting it. I want pt to be on Dovato but still not on formulary here (unfortunately)   300 mg Per Tube Daily 04/15/20 1307 04/18/20 1043   04/16/20 0900  sulfamethoxazole-trimethoprim (BACTRIM DS) 800-160 MG per tablet 1 tablet     Discontinue     1 tablet Oral Once per day on Mon Wed Fri 04/15/20 1350     04/15/20 1245  dolutegravir (TIVICAY) tablet 50 mg     Discontinue     50 mg Oral Daily 04/15/20 1202     04/15/20 1245  lamiVUDine (EPIVIR) tablet 300 mg  Status:  Discontinued       Note to Pharmacy: I DO NOT renally dose lamivudine and I have never had problems NOT adjusting it. I want pt to be on Dovato but still not on formulary here (unfortunately)   300 mg Oral Daily 04/15/20 1202 04/15/20 1307   04/13/20 1737  ceFAZolin (ANCEF) 2-4 GM/100ML-% IVPB       Note to Pharmacy: Desiree Hane   : cabinet override      04/13/20 1737 04/14/20 0544   04/13/20 0000  ceFAZolin (ANCEF) IVPB 2g/100 mL premix        2 g 200 mL/hr over 30 Minutes Intravenous To Radiology 04/12/20 1454 04/13/20 0834       DVT prophylaxis: Heparin  Code Status: Full code  Family Communication: No family at bedside    Status is: Inpatient  Dispo: The patient is from: Home               Anticipated d/c is to: Skilled nursing facility              Anticipated d/c date is: 04/27/2020              Patient currently not medically stable for discharge  Barrier to discharge-patient is to sit in chair for hemodialysis, if he can sit in chair on Thursday he can be discharged to skilled nursing facility.     Consultants:  Vascular surgery  Nephrology  Procedures: AV fistula formation left arm   Objective   Vitals:   04/28/20 2308 04/29/20 0309 04/29/20 0412 04/29/20 0840  BP: (!) 148/77 140/67  (!) 142/78  Pulse: 97 88  99  Resp: 17 16  18   Temp: 99.4 F (37.4 C) 98.5 F (36.9 C)  99.5 F (37.5 C)  TempSrc: Oral Oral  Oral  SpO2: 100% 100%  100%  Weight:   74 kg   Height:        Intake/Output Summary (Last 24 hours) at 04/29/2020 1051 Last data filed at 04/29/2020 0900 Gross per 24 hour  Intake 1240 ml  Output 1250 ml  Net -10 ml    08/06 1901 - 08/08 0700 In: 1270 [P.O.:1020] Out: 1250 [Urine:250]  Filed Weights   04/28/20 0327 04/28/20 1351 04/29/20 0412  Weight: 72 kg 73 kg 74 kg    Physical Examination:  General-appears in no acute distress Heart-S1-S2, regular, no murmur auscultated Lungs-clear to auscultation bilaterally, no wheezing or crackles auscultated Abdomen-soft, nontender, no organomegaly Extremities-no edema in the lower extremities Neuro-alert, oriented x3, weakness in right upper extremity   Data Reviewed:   Recent Results (from the past 240 hour(s))  Fungus Culture With Stain     Status: None (Preliminary result)   Collection Time: 04/20/20  2:08 PM   Specimen: Skin Biopsy  Result Value  Ref Range Status   Fungus Stain Final report  Final    Comment: (NOTE) Performed At: [facility] [place], [place] [id number] Rush Farmer MD YK:[id number]    Fungus (Mycology) Culture PENDING  Incomplete   Fungal Source SKIN  Final    Comment: BIOPSY Performed at [facility], [place] [address], [place], [place] [address]   Acid Fast Smear (AFB)     Status: None   Collection Time: 04/20/20  2:08 PM   Specimen: Skin Biopsy  Result Value Ref Range Status   AFB Specimen Processing Comment  Final    Comment: Tissue Grinding and Digestion/Decontamination   Acid Fast Smear Negative  Final    Comment: (NOTE) Performed At: [facility] [place], [place] [id number] Rush Farmer MD MA:[id number]    Source (AFB) SKIN  Final    Comment: BIOPSY Performed at [facility], [place] [address], [place], [place] [address]   Fungus Culture Result     Status: None   Collection Time: 04/20/20  2:08 PM  Result Value Ref Range Status   Result 1 Comment  Final    Comment: (NOTE) KOH/Calcofluor preparation:  no fungus observed. Performed At: [facility] [place], [place] [id number] Rush Farmer MD TL:[id number]      ProBNP (last 3 results) Recent Labs    06/22/19 1505  PROBNP 170.0*      Joy   [facility] If 7PM-7AM, please contact night-coverage at [email or web address], Office  [phone]   04/29/2020, 10:51 AM  LOS: 19 days

## 2020-04-29 NOTE — Progress Notes (Signed)
Pt has had some issues with refusing some of his meds while here (bactrim, lamivudine, Tivicay). We had to split Dovato up into dolutegravir/lamivudine. He took Tivicay today but refused lamivudine. D/w Dr. Darrick Meigs and Dr. Megan Salon and we will change him to once daily combo Biktarvy.  Onnie Boer, PharmD, BCIDP, AAHIVP, CPP Infectious Disease Pharmacist 04/29/2020 2:43 PM

## 2020-04-30 LAB — GLUCOSE, CAPILLARY
Glucose-Capillary: 186 mg/dL — ABNORMAL HIGH (ref 70–99)
Glucose-Capillary: 126 mg/dL — ABNORMAL HIGH (ref 70–99)
Glucose-Capillary: 116 mg/dL — ABNORMAL HIGH (ref 70–99)
Glucose-Capillary: 194 mg/dL — ABNORMAL HIGH (ref 70–99)
Glucose-Capillary: 62 mg/dL — ABNORMAL LOW (ref 70–99)
Glucose-Capillary: 115 mg/dL — ABNORMAL HIGH (ref 70–99)
Glucose-Capillary: 158 mg/dL — ABNORMAL HIGH (ref 70–99)

## 2020-04-30 LAB — HLA B*5701: HLA B 5701: NEGATIVE

## 2020-04-30 LAB — SARS CORONAVIRUS 2 (TAT 6-24 HRS): SARS Coronavirus 2: NEGATIVE

## 2020-04-30 MED ORDER — BICTEGRAVIR-EMTRICITAB-TENOFOV 50-200-25 MG PO TABS: 1.0000 | ORAL_TABLET | Freq: Every day | ORAL | 0 refills | Status: DC

## 2020-04-30 MED FILL — BIKTARVY 50-200-25 MG TABS: 50-200-25 | 30 days supply | Qty: 30 | Fill #0

## 2020-04-30 NOTE — Progress Notes (Signed)
Physical Therapy Treatment Patient Details Name: Richard [name] MRN: [id number] DOB: June 11, 1956 Today's Date: 04/30/2020    History of Present Illness 64 year old male with medical history of Arthritis, Diabetes mellitus, type II, insulin dependent, GERD, HTN, Immune deficiency disorder, migraines, neuropathy in diabetes, ruptured lumbar disc, ESRD stage V , GFR < 15 ml/min, declined  Dialysis in the past, CVA with residual right sided weakness, seizures admitted 7/20 through ED with seizure activity lasting 4 minutes per roommate, and encephalopathy. MRI of brain showed 2 mm infarct in left parietal lobe. A tunneled dialysis catheter was placed and  Dialysis was started on 7/23; New diagnosis this admission of HIV/AIDS     PT Comments    Pt more interactive, still needing maximal encouragement, still saying one thing and actions speak to the opposite.  "nobody's going to stop me (from getting better), but I'm not going to do (anything) today".  Emphasis on encouraging, transitioning to EOB, sitting balance EOB, transfer to the recliner for sitting tolerance up OOB.   Follow Up Recommendations   SNF;Supervision/Assistance - 24 hour     Equipment Recommendations  None recommended by PT    Recommendations for Other Services       Precautions / Restrictions Precautions Precautions: Fall Restrictions Weight Bearing Restrictions: No    Mobility  Bed Mobility Overal bed mobility: Needs Assistance Bed Mobility: Supine to Sit     Supine to sit: Mod assist     General bed mobility comments: didn't give pt the space of time to gather is thoughts to refuse.  Transfers Overall transfer level: Needs assistance   Transfers: Squat Pivot Transfers     Squat pivot transfers: Max assist;+2 physical assistance     General transfer comment: pt actively helped very little, he did reach L UE to the R armest, hindering positioning.  Pt held his squat throughout the transfer and  didn't otherwise collapse toward the floor.  Ambulation/Gait             General Gait Details: unable   Stairs             Wheelchair Mobility    Modified Rankin (Stroke Patients Only) Modified Rankin (Stroke Patients Only) Pre-Morbid Rankin Score: No symptoms (not sure pre morbid) Modified Rankin: Severe disability     Balance Overall balance assessment: Needs assistance Sitting-balance support: No upper extremity supported;Feet supported Sitting balance-Leahy Scale: Fair Sitting balance - Comments: sat EOB preferring UE support, but able to maintain balance staying within BOS without UE     Standing balance-Leahy Scale: Zero                              Cognition Arousal/Alertness: Awake/alert Behavior During Therapy: Flat affect Overall Cognitive Status: Impaired/Different from baseline                     Current Attention Level: Sustained   Following Commands: Follows one step commands inconsistently;Follows one step commands with increased time Safety/Judgement: Decreased awareness of deficits;Decreased awareness of safety Awareness: Intellectual Problem Solving: Slow processing;Decreased initiation General Comments: more alert and interactive, but also showing decreased awareness say I'm going home, Im not going to let anything get me down, but in the same thought saying he's not doing anything, he's not feeling it.      Exercises Other Exercises Other Exercises: P/AAROM to R LE in LAQ sitting EOB x5    General  Comments General comments (skin integrity, edema, etc.): Alert, minimally participating with maximal encouragement      Pertinent Vitals/Pain Pain Assessment: No/denies pain Faces Pain Scale: No hurt Pain Intervention(s): Monitored during session    Home Living                      Prior Function            PT Goals (current goals can now be found in the care plan section) Acute Rehab PT Goals PT  Goal Formulation: With patient Time For Goal Achievement: 04/30/20 Potential to Achieve Goals: Fair Progress towards PT goals: Progressing toward goals (goals to continue as is, small progress toward goals)    Frequency    Min 2X/week      PT Plan Discharge plan needs to be updated    Co-evaluation PT/OT/SLP Co-Evaluation/Treatment: Yes Reason for Co-Treatment: To address functional/ADL transfers;Necessary to address cognition/behavior during functional activity PT goals addressed during session: Mobility/safety with mobility        AM-PAC PT "6 Clicks" Mobility   Outcome Measure  Help needed turning from your back to your side while in a flat bed without using bedrails?: Total Help needed moving from lying on your back to sitting on the side of a flat bed without using bedrails?: A Lot Help needed moving to and from a bed to a chair (including a wheelchair)?: Total Help needed standing up from a chair using your arms (e.g., wheelchair or bedside chair)?: Total Help needed to walk in hospital room?: Total Help needed climbing 3-5 steps with a railing? : Total 6 Click Score: 7    End of Session   Activity Tolerance: Patient tolerated treatment well Patient left: in chair;with call bell/phone within reach;with chair alarm set Nurse Communication: Mobility status PT Visit Diagnosis: Muscle weakness (generalized) (M62.81);Difficulty in walking, not elsewhere classified (R26.2)     Time: 8676-1950 PT Time Calculation (min) (ACUTE ONLY): 31 min  Charges:  $Therapeutic Exercise: 8-22 mins                     04/30/2020  Richard Carne., PT Acute Rehabilitation Services [phone]  (pager) [phone]  (office)   Richard [name] Richard [name] 04/30/2020, 12:21 PM

## 2020-04-30 NOTE — Plan of Care (Signed)
  Problem: Clinical Measurements: Goal: Respiratory complications will improve Outcome: Progressing   Problem: Activity: Goal: Risk for activity intolerance will decrease Outcome: Progressing   Problem: Coping: Goal: Level of anxiety will decrease Outcome: Progressing   Problem: Safety: Goal: Ability to remain free from injury will improve Outcome: Progressing   

## 2020-04-30 NOTE — TOC Progression Note (Addendum)
Transition of Care [facility]) - Progression Note    Patient Details  Name: Draeden Kellman MRN: [id number] Date of Birth: 1956-03-06  Transition of [facility]) CM/SW [place], [place] Phone Number: 04/30/2020, 11:22 AM  Clinical Narrative:   CSW following for discharge to SNF. Still awaiting insurance authorization; Mcarthur Rossetti is asking for therapy updates. CSW sent message to PT/OT to request that they see patient. Patient also awaiting updated COVID test, collected this morning. CSW to follow.  UPDATE: CSW sent therapy notes to [facility] for insurance approval.    Expected Discharge Plan: [place] Barriers to Discharge: Continued Medical Work up, Waiting for outpatient dialysis, Transportation, Ship broker  Expected Discharge Plan and Services Expected Discharge Plan: [place] Choice: [place] Living arrangements for the past 2 months: Single Family Home                                       Social Determinants of Health (SDOH) Interventions    Readmission Risk Interventions Readmission Risk Prevention Plan 02/15/2020  Transportation Screening Complete  PCP or Specialist Appt within 5-7 Days Not Complete  Not Complete comments pending disposition  Home Care Screening Complete  Medication Review (RN CM) Referral to Pharmacy  Some recent data might be hidden

## 2020-04-30 NOTE — Progress Notes (Addendum)
[facility]  PROGRESS NOTE  Richard [name] YWV:[id number] DOB: May 09, 1956 DOA: 04/10/2020 PCP: [name], MD   Brief HPI:   64 year-old male with medical history of diabetes mellitus, osteoarthritis, hypertension, CKD stage V, previous CVA with residual right-sided weakness, migraine headache, poor compliance, who had refused hemodialysis access in the past, came to ED s/p seizure.  Patient has history of seizures but has been noncompliant with seizure medications.  Seizure was witnessed by his roommate and lasted for 4 minutes. He was also found to have CVA along with seizure.  Neurology was consulted.  Started on antiepileptic drugs.  Vascular surgery was consulted for access placement as patient recently started hemodialysis for ESRD.  NG tube was removed on 28th July.  Currently on p.o. diet.  Continues to be intermittently noncompliant with medications.    Subjective  Patient seen and examined, wants diet to be changed from renal/diabetic diet to regular diet.  Says that he is not getting enough food.   Assessment/Plan:     1. Metabolic encephalopathy-improved, likely from seizure, hypertensive emergency, acute stroke, uremia.  Will continue to monitor. 2. Acute CVA-patient has history of CVA with residual right-sided weakness and chronic right upper extremity contracture.  CT scan on admission showed no acute finding.  Mental status changes were observed on morning of lateral dissection, code stroke was initiated.  Stat CT head showed no acute finding, MRI showed 2 mm acute to early subacute infarct within the left parietal lobe.  HIV screen was positive.  S/p lumbar puncture on 04/14/2020.  CSF Gram stain/culture was negative.  Cryptococcus PCR is negative.  VDRL CSF is negative.  Neurology was consulted and recommended to continue Plavix and statin.  Patient had core track tube which was removed and started on p.o. diet.  PT/OT was obtained, recommended skilled  nursing facility placement. 3. Failure to thrive/moderate calorie malnutrition-p.o. intake has improved.  Nutrition following.  Cortrack tube was removed and started on p.o. diet. 4. Recurrent seizure in setting of noncompliance-patient has history of noncompliance with seizure medications.  EEG on July 24 showed suggestive of moderate diffuse encephalopathy, nonspecific etiology.  No seizures or epileptiform discharges were seen throughout the recording.  S/p lumbar puncture on 04/14/2020.  CSF Gram stain was negative.  Cryptococcus PCR was negative.  VDRL CSF is negative.  He was started on IV Keppra and Vimpat.  Now only on Keppra as per neurology recommendation. 5. ESRD-on hemodialysis, also had AV fistula made in left arm.  Patient passed out during hemodialysis session yesterday.  As per nephrology note, patient tolerated dialysis session well while sitting in chair.  Will start planning for discharge to skilled nursing facility.  6. Hypertensive emergency-resolved, well controlled on amlodipine, carvedilol.  Continue to monitor patient blood pressure closely. 7. Anemia of chronic disease-hemoglobin is stable.  Follow CBC periodically. 8. HIV/AIDS-new diagnosis, CD4 count 39, viral load 624,000.  Infectious disease was consulted and started on Tivicay, Epivir.  Patient has been noncompliant with his medications, HIV antiretroviral therapy changed to once daily combo Biktarvy.  Continue Bactrim 3 times a week for PCP prophylaxis. 9. Skin rash-patient had a biopsy from skin rash on 04/20/2020.  Pathology consistent with Psoriasiform dermatitis. 10. Diabetes mellitus type 2-continue sliding scale insulin with NovoLog.  Blood glucose well controlled.  Will change diet to regular diet as per patient request.   Scheduled medications:   . amLODipine  10 mg Oral Daily  . bictegravir-emtricitabine-tenofovir AF  1 tablet Oral  Daily  . carvedilol  3.125 mg Oral BID WC  . Chlorhexidine Gluconate Cloth  6  each Topical Q0600  . Chlorhexidine Gluconate Cloth  6 each Topical Q0600  . clopidogrel  75 mg Oral Daily  . darbepoetin (ARANESP) injection - DIALYSIS  150 mcg Intravenous Q Thu-HD  . feeding supplement (NEPRO CARB STEADY)  237 mL Oral TID BM  . heparin injection (subcutaneous)  5,000 Units Subcutaneous Q8H  . insulin aspart  0-9 Units Subcutaneous TID WC  . insulin glargine  5 Units Subcutaneous QHS  . levETIRAcetam  500 mg Oral Q12H  . lidocaine-EPINEPHrine  20 mL Intradermal Once  . LORazepam  2 mg Intravenous Once  . multivitamin  1 tablet Oral QHS  . pantoprazole  40 mg Oral Daily  . pravastatin  80 mg Oral QHS  . sulfamethoxazole-trimethoprim  1 tablet Oral Once per day on Mon Wed Fri  . tamsulosin  0.4 mg Oral Daily         CBG: Recent Labs  Lab 04/29/20 1610 04/29/20 1915 04/29/20 2308 04/30/20 0331 04/30/20 0754  GLUCAP 127* 113* 172* 186* 126*    SpO2: 100 % O2 Flow Rate (L/min): 2 L/min    CBC: Recent Labs  Lab 04/24/20 0346 04/25/20 0304 04/26/20 0638 04/28/20 1422  WBC 7.4 7.4 7.6 6.8  HGB 8.3* 8.6* 8.9* 7.5*  HCT 25.8* 26.8* 27.3* 23.7*  MCV 92.1 94.4 92.5 95.2  PLT 221 184 205 607    Basic Metabolic Panel: Recent Labs  Lab 04/24/20 0346 04/25/20 0304 04/26/20 0638 04/27/20 0330 04/28/20 1422  NA 138 136 138 138 137  K 4.2 4.1 4.8 4.7 4.4  CL 102 99 101 99 100  CO2 25 26 25 26 25   GLUCOSE 177* 185* 114* 197* 164*  BUN 62* 35* 49* 29* 42*  CREATININE 9.03* 6.27* 7.57* 5.25* 7.51*  CALCIUM 8.5* 8.4* 8.8* 8.3* 8.2*  PHOS  --   --  5.1*  --  3.3     Liver Function Tests: Recent Labs  Lab 04/24/20 0346 04/25/20 0304 04/26/20 0638 04/28/20 1422  AST 15 16  --   --   ALT 12 12  --   --   ALKPHOS 80 81  --   --   BILITOT 0.7 0.4  --   --   PROT 5.9* 5.8*  --   --   ALBUMIN 2.4* 2.4* 2.5* 2.3*     Antibiotics: Anti-infectives (From admission, onward)   Start     Dose/Rate Route Frequency Ordered Stop   05/01/20 0000   bictegravir-emtricitabine-tenofovir AF (BIKTARVY) 50-200-25 MG TABS tablet     Discontinue     1 tablet Oral Daily 04/30/20 1031     04/29/20 1600  bictegravir-emtricitabine-tenofovir AF (BIKTARVY) 50-200-25 MG per tablet 1 tablet     Discontinue     1 tablet Oral Daily 04/29/20 1441     04/25/20 0000  ceFAZolin (ANCEF) IVPB 2g/100 mL premix       Note to Pharmacy: Send with pt to OR   2 g 200 mL/hr over 30 Minutes Intravenous On call 04/24/20 1021 04/25/20 0845   04/23/20 0000  sulfamethoxazole-trimethoprim (BACTRIM DS) 800-160 MG tablet     Discontinue     1 tablet Oral 3 times weekly 04/23/20 0918     04/19/20 1000  lamiVUDine (EPIVIR) tablet 300 mg  Status:  Discontinued       Note to Pharmacy: I DO NOT renally dose lamivudine and I  have never had problems NOT adjusting it. I want pt to be on Dovato but still not on formulary here (unfortunately)   300 mg Oral Daily 04/18/20 1043 04/29/20 1441   04/17/20 0000  Dolutegravir-lamiVUDine (DOVATO) 50-300 MG TABS  Status:  Discontinued        1 tablet Oral Daily 04/17/20 1546 04/30/20    04/16/20 1000  lamiVUDine (EPIVIR) tablet 300 mg  Status:  Discontinued       Note to Pharmacy: I DO NOT renally dose lamivudine and I have never had problems NOT adjusting it. I want pt to be on Dovato but still not on formulary here (unfortunately)   300 mg Per Tube Daily 04/15/20 1307 04/18/20 1043   04/16/20 0900  sulfamethoxazole-trimethoprim (BACTRIM DS) 800-160 MG per tablet 1 tablet     Discontinue     1 tablet Oral Once per day on Mon Wed Fri 04/15/20 1350     04/15/20 1245  dolutegravir (TIVICAY) tablet 50 mg  Status:  Discontinued        50 mg Oral Daily 04/15/20 1202 04/29/20 1441   04/15/20 1245  lamiVUDine (EPIVIR) tablet 300 mg  Status:  Discontinued       Note to Pharmacy: I DO NOT renally dose lamivudine and I have never had problems NOT adjusting it. I want pt to be on Dovato but still not on formulary here (unfortunately)   300 mg Oral Daily  04/15/20 1202 04/15/20 1307   04/13/20 1737  ceFAZolin (ANCEF) 2-4 GM/100ML-% IVPB       Note to Pharmacy: Desiree Hane   : cabinet override      04/13/20 1737 04/14/20 0544   04/13/20 0000  ceFAZolin (ANCEF) IVPB 2g/100 mL premix        2 g 200 mL/hr over 30 Minutes Intravenous To Radiology 04/12/20 1454 04/13/20 0834       DVT prophylaxis: Heparin  Code Status: Full code  Family Communication: No family at bedside    Status is: Inpatient  Dispo: The patient is from: Home              Anticipated d/c is to: Skilled nursing facility              Anticipated d/c date is: 05/01/2020              Patient currently medically stable for discharge  Barrier to discharge-awaiting placement at skilled nursing facility     Consultants:  Vascular surgery  Nephrology  Procedures: AV fistula formation left arm   Objective   Vitals:   04/29/20 2306 04/30/20 0118 04/30/20 0306 04/30/20 0752  BP: (!) 154/63  (!) 155/65 (!) 154/75  Pulse: 81  94 90  Resp:   16 19  Temp:   98 F (36.7 C) 98.4 F (36.9 C)  TempSrc:   Oral Oral  SpO2:   100% 100%  Weight:  74 kg    Height:        Intake/Output Summary (Last 24 hours) at 04/30/2020 1110 Last data filed at 04/30/2020 0750 Gross per 24 hour  Intake 717 ml  Output 300 ml  Net 417 ml    08/07 1901 - 08/09 0700 In: 6237 [P.O.:1077] Out: 550 [Urine:550]  Filed Weights   04/28/20 1351 04/29/20 0412 04/30/20 0118  Weight: 73 kg 74 kg 74 kg    Physical Examination:  General-appears in no acute distress Heart-S1-S2, regular, no murmur auscultated Lungs-clear to auscultation bilaterally, no wheezing or crackles  auscultated Abdomen-soft, nontender, no organomegaly Extremities-no edema in the lower extremities Neuro-alert, oriented x3, contractures in left upper extremity   Data Reviewed:   Recent Results (from the past 240 hour(s))  Fungus Culture With Stain     Status: None (Preliminary result)   Collection Time:  04/20/20  2:08 PM   Specimen: Skin Biopsy  Result Value Ref Range Status   Fungus Stain Final report  Final    Comment: (NOTE) Performed At: [facility] [place], [place] [id number] Rush Farmer MD YT:[id number]    Fungus (Mycology) Culture PENDING  Incomplete   Fungal Source SKIN  Final    Comment: BIOPSY Performed at [facility], [place] [address], [place], [place] [address]   Acid Fast Smear (AFB)     Status: None   Collection Time: 04/20/20  2:08 PM   Specimen: Skin Biopsy  Result Value Ref Range Status   AFB Specimen Processing Comment  Final    Comment: Tissue Grinding and Digestion/Decontamination   Acid Fast Smear Negative  Final    Comment: (NOTE) Performed At: [facility] [place], [place] [id number] Rush Farmer MD CB:[id number]    Source (AFB) SKIN  Final    Comment: BIOPSY Performed at [facility], [place] [address] [place], [place] [address]   Fungus Culture Result     Status: None   Collection Time: 04/20/20  2:08 PM  Result Value Ref Range Status   Result 1 Comment  Final    Comment: (NOTE) KOH/Calcofluor preparation:  no fungus observed. Performed At: [facility] [place], [place] [id number] Rush Farmer MD NI:[id number]      ProBNP (last 3 results) Recent Labs    06/22/19 1505  PROBNP 170.0*      [place]   [facility] If 7PM-7AM, please contact night-coverage at [email or web address], Office  [phone]   04/30/2020, 11:10 AM  LOS: 20 days

## 2020-04-30 NOTE — TOC Progression Note (Signed)
Transition of Care [facility]) - Progression Note    Patient Details  Name: Richard [name] MRN: [id number] Date of Birth: [birth year]  Transition [facility]) CM/SW [place],  Phone Number: 04/30/2020, 11:23 AM  Clinical Narrative:   CSW noting that patient had improved dialysis yesterday, plan to discharge to SNF. CSW asked MD to order updated COVID test for hopeful discharge tomorrow.     Expected Discharge Plan: [place] Barriers to Discharge: Continued Medical Work up, Waiting for outpatient dialysis, Transportation, Ship broker  Expected Discharge Plan and Services Expected Discharge Plan: [place] Choice: [place] arrangements for the past 2 months: Single Family Home                                       Social Determinants of Health (SDOH) Interventions    Readmission Risk Interventions Readmission Risk Prevention Plan 02/15/2020  Transportation Screening Complete  PCP or Specialist Appt within 5-7 Days Not Complete  Not Complete comments pending disposition  Home Care Screening Complete  Medication Review (RN CM) Referral to Pharmacy  Some recent data might be hidden

## 2020-04-30 NOTE — Progress Notes (Signed)
Pt CBG at 1944 was 62; however, his dinner tray was just arriving. CBG was re-checked after he finished eating, and it had risen to 115. Will continue to monitor pt.

## 2020-04-30 NOTE — Progress Notes (Signed)
Current Palliative:  Manufacturing engineer [facility]) Community Based Palliative Care        This patient has been referred for our palliative care services in the community.  [facility] will continue to follow for any discharge planning needs and to coordinate initiation of palliative care.   I f you have questions or need assistance, please call [phone] or contact the hospital Liaison listed on AMION.        Domenic Moras, BSN, RN [facility] Liaison   [phone] (24h on call)

## 2020-04-30 NOTE — Progress Notes (Signed)
Occupational Therapy Treatment Patient Details Name: Richard [name] MRN: [id number] DOB: 12-28-1955 Today's Date: 04/30/2020    History of present illness 64 year old male with medical history of Arthritis, Diabetes mellitus, type II, insulin dependent, GERD, HTN, Immune deficiency disorder, migraines, neuropathy in diabetes, ruptured lumbar disc, ESRD stage V , GFR < 15 ml/min, declined  Dialysis in the past, CVA with residual right sided weakness, seizures admitted 7/20 through ED with seizure activity lasting 4 minutes per roommate, and encephalopathy. MRI of brain showed 2 mm infarct in left parietal lobe. A tunneled dialysis catheter was placed and  Dialysis was started on 7/23; New diagnosis this admission of HIV/AIDS    OT comments  Patient sidelying in bed and agreeable to OT/PT session.  Requires max encouragement for participation, reporting "I just don't have it in my mind I want to get up today".  Patient requires mod assist for bed mobility, total assist for LB ADls and max assist +2 for squat pivot transfer to recliner.  Patient remains limited by weakness, impaired balance, cognition and dynamic balance. Will follow acutely. Goals updated today, met 0/5; but noted improved sitting balance min guard to close supervision. SNF remains appropriate.    Follow Up Recommendations  SNF;Supervision/Assistance - 24 hour    Equipment Recommendations  3 in 1 bedside commode    Recommendations for Other Services      Precautions / Restrictions Precautions Precautions: Fall Restrictions Weight Bearing Restrictions: No       Mobility Bed Mobility Overal bed mobility: Needs Assistance Bed Mobility: Supine to Sit     Supine to sit: Mod assist     General bed mobility comments: pt requires support for trunk support to EOB   Transfers Overall transfer level: Needs assistance   Transfers: Squat Pivot Transfers     Squat pivot transfers: Max assist;+2 physical assistance      General transfer comment: patient requires max assist to power up and steady and pivot hips with +2 support, patient reaching with L UE only; increased time and effort    Balance Overall balance assessment: Needs assistance Sitting-balance support: No upper extremity supported;Feet supported Sitting balance-Leahy Scale: Fair Sitting balance - Comments: sat EOB preferring UE support, but able to maintain balance staying within BOS without UE     Standing balance-Leahy Scale: Zero                             ADL either performed or assessed with clinical judgement   ADL Overall ADL's : Needs assistance/impaired                     Lower Body Dressing: Total assistance;+2 for physical assistance;Sitting/lateral leans   Toilet Transfer: Maximal assistance;+2 for physical assistance;+2 for safety/equipment;Squat-pivot Toilet Transfer Details (indicate cue type and reason): simulated to recliner          Functional mobility during ADLs: Maximal assistance;+2 for physical assistance;+2 for safety/equipment;Cueing for safety;Cueing for sequencing General ADL Comments: pt self limiting at times, requires max encouragement for OOB activity      Vision       Perception     Praxis      Cognition Arousal/Alertness: Awake/alert Behavior During Therapy: Flat affect Overall Cognitive Status: Impaired/Different from baseline Area of Impairment: Attention;Following commands;Safety/judgement;Awareness;Problem solving                   Current Attention Level: Sustained  Following Commands: Follows one step commands inconsistently;Follows one step commands with increased time Safety/Judgement: Decreased awareness of deficits;Decreased awareness of safety Awareness: Intellectual Problem Solving: Slow processing;Decreased initiation General Comments: patient alert and following simple commands, contniues to require increased time for processing and  planning with poor awareness to deficits and need for assist; attempted orientation questions and pt reports "Im not going through that"         Exercises Other Exercises Other Exercises: P/AAROM to R LE in LAQ sitting EOB x5   Shoulder Instructions       General Comments pt self limiting at times, requires maximal encouragement     Pertinent Vitals/ Pain       Pain Assessment: No/denies pain Faces Pain Scale: No hurt Pain Intervention(s): Monitored during session  Home Living                                          Prior Functioning/Environment              Frequency  Min 2X/week        Progress Toward Goals  OT Goals(current goals can now be found in the care plan section)  Progress towards OT goals: Progressing toward goals  Acute Rehab OT Goals Patient Stated Goal: to get some rest OT Goal Formulation: With patient  Plan Discharge plan remains appropriate;Frequency remains appropriate    Co-evaluation    PT/OT/SLP Co-Evaluation/Treatment: Yes Reason for Co-Treatment: For patient/therapist safety;To address functional/ADL transfers PT goals addressed during session: Mobility/safety with mobility OT goals addressed during session: ADL's and self-care      AM-PAC OT "6 Clicks" Daily Activity     Outcome Measure   Help from another person eating meals?: A Little Help from another person taking care of personal grooming?: A Little Help from another person toileting, which includes using toliet, bedpan, or urinal?: Total Help from another person bathing (including washing, rinsing, drying)?: Total Help from another person to put on and taking off regular upper body clothing?: A Lot Help from another person to put on and taking off regular lower body clothing?: Total 6 Click Score: 11    End of Session    OT Visit Diagnosis: Muscle weakness (generalized) (M62.81);Pain;Other symptoms and signs involving the nervous system  (R29.898) Pain - Right/Left: Right Pain - part of body: Arm   Activity Tolerance Patient tolerated treatment well   Patient Left in chair;with call bell/phone within reach;with chair alarm set   Nurse Communication Mobility status;Precautions        Time: 0017-4944 OT Time Calculation (min): 31 min  Charges: OT General Charges $OT Visit: 1 Visit OT Treatments $Self Care/Home Management : 8-22 mins  Jolaine Artist, [place] Pager [phone] Office [phone]    Delight Stare 04/30/2020, 1:20 PM

## 2020-04-30 NOTE — Progress Notes (Signed)
[facility] Progress Note   64 y.o. yo male with HIV/SZ/advanced CKD as well as noncompliance who was admitted on 04/10/2020 with  sz but also found to progress to ESRD and has had dialysis initiated this hosp   Assessment/ Plan:   1. sz-  Per primary team-  Anti sz meds as he will consent 2. ESRD- new diagnosis this hospital admit.  Have initiated HD- AVF placed 8/4.  Has spot at [facility] on TTS - second shift, running here now TTS schedule-  Had episode in HD on Thurs-  That was the second such episode;  HD here went well on Saturday so feel better about his stability at OP center so feel like is OK to start planning for d/c to SNF  Next HD tomorrow  3. Anemia- iron repletion in progress-  Also ESA-   inc dose for hgb in the 8's  4. Secondary hyperparathyroidism-  PTH 160- calc /phos WNL- on no meds  5. HTN/volume-   better-  Refusing meds at times-  On reg of norvasc 10, coreg 3.125 (may inc), then hydralazine and labetalol PRN (has not needed) 6. HIV-  Tivicay-   7. FTT-  Albumin under 3 and will need SNF placement at discharge  8. Dispo-  Difficult situation-  Will need SNF at d/c-  Palliative speaking with family that have been hesitant to make decisions re dispo and DNR-  Seems will be followed by palliative at discharge  Subjective:   Denies f/c/n/v/dyspnea.   Objective:   BP (!) 132/98 (BP Location: Left Leg)   Pulse 85   Temp 98.6 F (37 C) (Oral)   Resp 19   Ht 6' (1.829 m)   Wt 74 kg   SpO2 98%   BMI 22.13 kg/m   Intake/Output Summary (Last 24 hours) at 04/30/2020 1435 Last data filed at 04/30/2020 1340 Gross per 24 hour  Intake 240 ml  Output 700 ml  Net -460 ml   Weight change: 1 kg  Physical Exam: General: alert, talkative  Heart: tachy Lungs: mostly clear Abdomen: soft, non tender Extremities: no edema-  Diffuse rash Dialysis Access: right sided TDC    new left AVF-  Good thrill and bruit   Imaging: No results  found.  Labs: BMET Recent Labs  Lab 04/24/20 0346 04/25/20 0304 04/26/20 0638 04/27/20 0330 04/28/20 1422  NA 138 136 138 138 137  K 4.2 4.1 4.8 4.7 4.4  CL 102 99 101 99 100  CO2 25 26 25 26 25   GLUCOSE 177* 185* 114* 197* 164*  BUN 62* 35* 49* 29* 42*  CREATININE 9.03* 6.27* 7.57* 5.25* 7.51*  CALCIUM 8.5* 8.4* 8.8* 8.3* 8.2*  PHOS  --   --  5.1*  --  3.3   CBC Recent Labs  Lab 04/24/20 0346 04/25/20 0304 04/26/20 0638 04/28/20 1422  WBC 7.4 7.4 7.6 6.8  HGB 8.3* 8.6* 8.9* 7.5*  HCT 25.8* 26.8* 27.3* 23.7*  MCV 92.1 94.4 92.5 95.2  PLT 221 184 205 185    Medications:    . amLODipine  10 mg Oral Daily  . bictegravir-emtricitabine-tenofovir AF  1 tablet Oral Daily  . carvedilol  3.125 mg Oral BID WC  . Chlorhexidine Gluconate Cloth  6 each Topical Q0600  . Chlorhexidine Gluconate Cloth  6 each Topical Q0600  . clopidogrel  75 mg Oral Daily  . darbepoetin (ARANESP) injection - DIALYSIS  150 mcg Intravenous Q Thu-HD  . feeding supplement (NEPRO CARB  STEADY)  237 mL Oral TID BM  . heparin injection (subcutaneous)  5,000 Units Subcutaneous Q8H  . insulin aspart  0-9 Units Subcutaneous TID WC  . insulin glargine  5 Units Subcutaneous QHS  . levETIRAcetam  500 mg Oral Q12H  . lidocaine-EPINEPHrine  20 mL Intradermal Once  . LORazepam  2 mg Intravenous Once  . multivitamin  1 tablet Oral QHS  . pantoprazole  40 mg Oral Daily  . pravastatin  80 mg Oral QHS  . sulfamethoxazole-trimethoprim  1 tablet Oral Once per day on Mon Wed Fri  . tamsulosin  0.4 mg Oral Daily      Otelia Santee, MD 04/30/2020, 2:35 PM

## 2020-05-01 LAB — RENAL FUNCTION PANEL
Albumin: 2.3 g/dL — ABNORMAL LOW (ref 3.5–5.0)
Anion gap: 12 (ref 5–15)
BUN: 43 mg/dL — ABNORMAL HIGH (ref 8–23)
CO2: 26 mmol/L (ref 22–32)
Calcium: 8.3 mg/dL — ABNORMAL LOW (ref 8.9–10.3)
Chloride: 101 mmol/L (ref 98–111)
Creatinine, Ser: 6.66 mg/dL — ABNORMAL HIGH (ref 0.61–1.24)
GFR calc Af Amer: 9 mL/min — ABNORMAL LOW (ref >60)
GFR calc non Af Amer: 8 mL/min — ABNORMAL LOW (ref >60)
Glucose, Bld: 146 mg/dL — ABNORMAL HIGH (ref 70–99)
Phosphorus: 2.9 mg/dL (ref 2.5–4.6)
Potassium: 4.1 mmol/L (ref 3.5–5.1)
Sodium: 139 mmol/L (ref 135–145)

## 2020-05-01 LAB — CBC
HCT: 23.1 % — ABNORMAL LOW (ref 39.0–52.0)
Hemoglobin: 7.2 g/dL — ABNORMAL LOW (ref 13.0–17.0)
MCH: 30.4 pg (ref 26.0–34.0)
MCHC: 31.2 g/dL (ref 30.0–36.0)
MCV: 97.5 fL (ref 80.0–100.0)
Platelets: 224 10*3/uL (ref 150–400)
RBC: 2.37 MIL/uL — ABNORMAL LOW (ref 4.22–5.81)
RDW: 16.4 % — ABNORMAL HIGH (ref 11.5–15.5)
WBC: 6.1 10*3/uL (ref 4.0–10.5)
nRBC: 0 % (ref 0.0–0.2)

## 2020-05-01 LAB — GLUCOSE, CAPILLARY
Glucose-Capillary: 166 mg/dL — ABNORMAL HIGH (ref 70–99)
Glucose-Capillary: 192 mg/dL — ABNORMAL HIGH (ref 70–99)
Glucose-Capillary: 218 mg/dL — ABNORMAL HIGH (ref 70–99)
Glucose-Capillary: 235 mg/dL — ABNORMAL HIGH (ref 70–99)
Glucose-Capillary: 67 mg/dL — ABNORMAL LOW (ref 70–99)

## 2020-05-01 MED ORDER — HEPARIN SODIUM (PORCINE) 1000 UNIT/ML IJ SOLN
INTRAMUSCULAR | Status: AC
  Administered 2020-05-01: 1000 [IU]
  Filled 2020-05-01: qty 4

## 2020-05-01 NOTE — Progress Notes (Signed)
Inpatient Diabetes Program Recommendations  AACE/ADA: New Consensus Statement on Inpatient Glycemic Control (2015)  Target Ranges:  Prepandial:   less than 140 mg/dL      Peak postprandial:   less than 180 mg/dL (1-2 hours)      Critically ill patients:  140 - 180 mg/dL   Lab Results  Component Value Date   GLUCAP 67 (L) 05/01/2020   HGBA1C 5.2 02/14/2020    Review of Glycemic Control Results for Richard [name], Richard [name] (MRN [id number]) as of 05/01/2020 14:33  Ref. Range 04/30/2020 19:44 04/30/2020 21:17 04/30/2020 23:12 05/01/2020 03:10 05/01/2020 12:46  Glucose-Capillary Latest Ref Range: 70 - 99 mg/dL 62 (L) 115 (H) 158 (H) 166 (H) 67 (L)    Inpatient Diabetes Program Recommendations:    Novolog 0-6 units TID to avoid over correction  Will continue to follow while inpatient.  Thank you, Reche Dixon, RN, BSN Diabetes Coordinator Inpatient Diabetes Program [phone] (team pager from 8a-5p)

## 2020-05-01 NOTE — Progress Notes (Signed)
[facility] Progress Note   64 y.o.yo malewith HIV/SZ/advanced CKD as well as noncompliancewho was admitted on7/20/2021with sz but also found to progress to ESRD and has had dialysis initiated this hosp   Assessment/ Plan:   1. sz-Per primary team- Anti sz meds as he will consent 2. ESRD- new diagnosis this hospital admit. Have initiated HD- AVF placed 8/4. Has spot at [facility] on TTS - second shift, running here now TTS schedule- Had episode in HD on Thurs- That was the second such episode; HD here went well on Saturday so feel better about his stability at OP center so feel like is OK to start planning for d/c to SNF  Seen on HD  3K 118/55 RIJ TC 2.5 L (net 2L goal)  Next HD Thursday; need to make sure he will be able to dialyze in a chair.  3. Anemia-iron repletion in progress- Also ESA- inc dose for hgb in the 8's  4. Secondary hyperparathyroidism- PTH 160- calc /phos WNL- on no meds  5. HTN/volume-better- Refusing meds at times- On reg of norvasc 10, coreg 3.125 (may inc), then hydralazine and labetalol PRN (has not needed) 6. HIV- Biktarvy  7. FTT- Albumin under 3 and will need SNF placement at discharge  8. Dispo- Difficult situation- Will need SNF at d/c- Palliative speaking with family that have been hesitant to make decisions re dispo and DNR- Seems will be followed by palliative at discharge  Subjective:   Denies f/c/n/v/dyspnea. But c/o back pain and had to be moved/ repositioned frequently on hd today   Objective:   BP (!) 141/63   Pulse 96   Temp 98.2 F (36.8 C) (Oral)   Resp 18   Ht 6' (1.829 m)   Wt 83 kg   SpO2 98%   BMI 24.82 kg/m   Intake/Output Summary (Last 24 hours) at 05/01/2020 7902 Last data filed at 04/30/2020 1945 Gross per 24 hour  Intake 117 ml  Output 1000 ml  Net -883 ml   Weight change: 4 kg  Physical Exam: General:alert, talkative Heart:  RRR Lungs: mostly clear Abdomen: soft, non  tender Extremities: no edema Dialysis Access: right sided TDC new left BCF - Good thrill and bruit   Imaging: No results found.  Labs: BMET Recent Labs  Lab 04/25/20 0304 04/26/20 0638 04/27/20 0330 04/28/20 1422 05/01/20 0723  NA 136 138 138 137 139  K 4.1 4.8 4.7 4.4 4.1  CL 99 101 99 100 101  CO2 26 25 26 25 26   GLUCOSE 185* 114* 197* 164* 146*  BUN 35* 49* 29* 42* 43*  CREATININE 6.27* 7.57* 5.25* 7.51* 6.66*  CALCIUM 8.4* 8.8* 8.3* 8.2* 8.3*  PHOS  --  5.1*  --  3.3 2.9   CBC Recent Labs  Lab 04/25/20 0304 04/26/20 0638 04/28/20 1422 05/01/20 0723  WBC 7.4 7.6 6.8 6.1  HGB 8.6* 8.9* 7.5* 7.2*  HCT 26.8* 27.3* 23.7* 23.1*  MCV 94.4 92.5 95.2 97.5  PLT 184 205 185 224    Medications:    . amLODipine  10 mg Oral Daily  . bictegravir-emtricitabine-tenofovir AF  1 tablet Oral Daily  . carvedilol  3.125 mg Oral BID WC  . Chlorhexidine Gluconate Cloth  6 each Topical Q0600  . Chlorhexidine Gluconate Cloth  6 each Topical Q0600  . clopidogrel  75 mg Oral Daily  . darbepoetin (ARANESP) injection - DIALYSIS  150 mcg Intravenous Q Thu-HD  . feeding supplement (NEPRO CARB STEADY)  237  mL Oral TID BM  . heparin injection (subcutaneous)  5,000 Units Subcutaneous Q8H  . insulin aspart  0-9 Units Subcutaneous TID WC  . insulin glargine  5 Units Subcutaneous QHS  . levETIRAcetam  500 mg Oral Q12H  . lidocaine-EPINEPHrine  20 mL Intradermal Once  . LORazepam  2 mg Intravenous Once  . multivitamin  1 tablet Oral QHS  . pantoprazole  40 mg Oral Daily  . pravastatin  80 mg Oral QHS  . sulfamethoxazole-trimethoprim  1 tablet Oral Once per day on Mon Wed Fri  . tamsulosin  0.4 mg Oral Daily      Otelia Santee, MD 05/01/2020, 9:07 AM

## 2020-05-01 NOTE — Progress Notes (Signed)
[facility]  PROGRESS NOTE  Azion Centrella OHY:[id number] DOB: 07-21-1956 DOA: 04/10/2020 PCP: [name], MD   Brief HPI:   64 year-old male with medical history of diabetes mellitus, osteoarthritis, hypertension, CKD stage V, previous CVA with residual right-sided weakness, migraine headache, poor compliance, who had refused hemodialysis access in the past, came to ED s/p seizure.  Patient has history of seizures but has been noncompliant with seizure medications.  Seizure was witnessed by his roommate and lasted for 4 minutes. He was also found to have CVA along with seizure.  Neurology was consulted.  Started on antiepileptic drugs.  Vascular surgery was consulted for access placement as patient recently started hemodialysis for ESRD.  NG tube was removed on 28th July.  Currently on p.o. diet.  Continues to be intermittently noncompliant with medications.    Subjective  Patient seen during HD session. Denies any complaints.   Assessment/Plan:     1. Metabolic encephalopathy-improved, likely from seizure, hypertensive emergency, acute stroke, uremia.  Will continue to monitor. 2. Acute CVA-patient has history of CVA with residual right-sided weakness and chronic right upper extremity contracture.  CT scan on admission showed no acute finding.  Mental status changes were observed , code stroke was initiated.  Stat CT head showed no acute finding, MRI showed 2 mm acute to early subacute infarct within the left parietal lobe.  HIV screen was positive.  S/p lumbar puncture on 04/14/2020.  CSF Gram stain/culture was negative.  Cryptococcus PCR is negative.  VDRL CSF is negative.  Neurology was consulted and recommended to continue Plavix and statin.  Patient had cortrack tube which was removed and started on p.o. diet.  PT/OT was obtained, recommended skilled nursing facility placement. 3. Failure to thrive/moderate calorie malnutrition-p.o. intake has improved.  Nutrition  following.  Cortrack tube was removed and started on p.o. diet. 4. Recurrent seizure in setting of noncompliance-patient has history of noncompliance with seizure medications.  EEG on July 24 showed suggestive of moderate diffuse encephalopathy, nonspecific etiology.  No seizures or epileptiform discharges were seen throughout the recording.  S/p lumbar puncture on 04/14/2020.  CSF Gram stain was negative.  Cryptococcus PCR was negative.  VDRL CSF is negative.  He was started on IV Keppra and Vimpat.  Now only on Keppra as per neurology recommendation. 5. ESRD-on hemodialysis, also had AV fistula made in left arm.  Patient passed out during hemodialysis session yesterday.  As per nephrology note, patient tolerated dialysis session well while sitting in chair.  Will start planning for discharge to skilled nursing facility.  6. Hypertensive emergency-resolved, well controlled on amlodipine, carvedilol.  Continue to monitor patient blood pressure closely. 7. Anemia of chronic disease-hemoglobin is stable.  Follow CBC periodically. 8. HIV/AIDS-new diagnosis, CD4 count 39, viral load 624,000.  Infectious disease was consulted and started on Tivicay, Epivir.  Patient has been noncompliant with his medications, HIV antiretroviral therapy changed to once daily combo Biktarvy.  Continue Bactrim 3 times a week for PCP prophylaxis. 9. Skin rash-patient had a biopsy from skin rash on 04/20/2020.  Pathology consistent with Psoriasiform dermatitis. 10. Diabetes mellitus type 2-continue sliding scale insulin with NovoLog.  Blood glucose well controlled.  Diet changed  to regular diet as per patient request.   Scheduled medications:   . amLODipine  10 mg Oral Daily  . bictegravir-emtricitabine-tenofovir AF  1 tablet Oral Daily  . carvedilol  3.125 mg Oral BID WC  . Chlorhexidine Gluconate Cloth  6 each Topical Q0600  .  Chlorhexidine Gluconate Cloth  6 each Topical Q0600  . clopidogrel  75 mg Oral Daily  .  darbepoetin (ARANESP) injection - DIALYSIS  150 mcg Intravenous Q Thu-HD  . feeding supplement (NEPRO CARB STEADY)  237 mL Oral TID BM  . heparin injection (subcutaneous)  5,000 Units Subcutaneous Q8H  . insulin aspart  0-9 Units Subcutaneous TID WC  . insulin glargine  5 Units Subcutaneous QHS  . levETIRAcetam  500 mg Oral Q12H  . lidocaine-EPINEPHrine  20 mL Intradermal Once  . LORazepam  2 mg Intravenous Once  . multivitamin  1 tablet Oral QHS  . pantoprazole  40 mg Oral Daily  . pravastatin  80 mg Oral QHS  . sulfamethoxazole-trimethoprim  1 tablet Oral Once per day on Mon Wed Fri  . tamsulosin  0.4 mg Oral Daily         CBG: Recent Labs  Lab 04/30/20 1520 04/30/20 1944 04/30/20 2117 04/30/20 2312 05/01/20 0310  GLUCAP 194* 62* 115* 158* 166*    SpO2: 98 % O2 Flow Rate (L/min): 2 L/min    CBC: Recent Labs  Lab 04/25/20 0304 04/26/20 0638 04/28/20 1422  WBC 7.4 7.6 6.8  HGB 8.6* 8.9* 7.5*  HCT 26.8* 27.3* 23.7*  MCV 94.4 92.5 95.2  PLT 184 205 149    Basic Metabolic Panel: Recent Labs  Lab 04/25/20 0304 04/26/20 0638 04/27/20 0330 04/28/20 1422  NA 136 138 138 137  K 4.1 4.8 4.7 4.4  CL 99 101 99 100  CO2 26 25 26 25   GLUCOSE 185* 114* 197* 164*  BUN 35* 49* 29* 42*  CREATININE 6.27* 7.57* 5.25* 7.51*  CALCIUM 8.4* 8.8* 8.3* 8.2*  PHOS  --  5.1*  --  3.3     Liver Function Tests: Recent Labs  Lab 04/25/20 0304 04/26/20 0638 04/28/20 1422  AST 16  --   --   ALT 12  --   --   ALKPHOS 81  --   --   BILITOT 0.4  --   --   PROT 5.8*  --   --   ALBUMIN 2.4* 2.5* 2.3*     Antibiotics: Anti-infectives (From admission, onward)   Start     Dose/Rate Route Frequency Ordered Stop   05/01/20 0000  bictegravir-emtricitabine-tenofovir AF (BIKTARVY) 50-200-25 MG TABS tablet     Discontinue     1 tablet Oral Daily 04/30/20 1031     04/29/20 1600  bictegravir-emtricitabine-tenofovir AF (BIKTARVY) 50-200-25 MG per tablet 1 tablet     Discontinue      1 tablet Oral Daily 04/29/20 1441     04/25/20 0000  ceFAZolin (ANCEF) IVPB 2g/100 mL premix       Note to Pharmacy: Send with pt to OR   2 g 200 mL/hr over 30 Minutes Intravenous On call 04/24/20 1021 04/25/20 0845   04/23/20 0000  sulfamethoxazole-trimethoprim (BACTRIM DS) 800-160 MG tablet     Discontinue     1 tablet Oral 3 times weekly 04/23/20 0918     04/19/20 1000  lamiVUDine (EPIVIR) tablet 300 mg  Status:  Discontinued       Note to Pharmacy: I DO NOT renally dose lamivudine and I have never had problems NOT adjusting it. I want pt to be on Dovato but still not on formulary here (unfortunately)   300 mg Oral Daily 04/18/20 1043 04/29/20 1441   04/17/20 0000  Dolutegravir-lamiVUDine (DOVATO) 50-300 MG TABS  Status:  Discontinued  1 tablet Oral Daily 04/17/20 1546 04/30/20    04/16/20 1000  lamiVUDine (EPIVIR) tablet 300 mg  Status:  Discontinued       Note to Pharmacy: I DO NOT renally dose lamivudine and I have never had problems NOT adjusting it. I want pt to be on Dovato but still not on formulary here (unfortunately)   300 mg Per Tube Daily 04/15/20 1307 04/18/20 1043   04/16/20 0900  sulfamethoxazole-trimethoprim (BACTRIM DS) 800-160 MG per tablet 1 tablet     Discontinue     1 tablet Oral Once per day on Mon Wed Fri 04/15/20 1350     04/15/20 1245  dolutegravir (TIVICAY) tablet 50 mg  Status:  Discontinued        50 mg Oral Daily 04/15/20 1202 04/29/20 1441   04/15/20 1245  lamiVUDine (EPIVIR) tablet 300 mg  Status:  Discontinued       Note to Pharmacy: I DO NOT renally dose lamivudine and I have never had problems NOT adjusting it. I want pt to be on Dovato but still not on formulary here (unfortunately)   300 mg Oral Daily 04/15/20 1202 04/15/20 1307   04/13/20 1737  ceFAZolin (ANCEF) 2-4 GM/100ML-% IVPB       Note to Pharmacy: Desiree Hane   : cabinet override      04/13/20 1737 04/14/20 0544   04/13/20 0000  ceFAZolin (ANCEF) IVPB 2g/100 mL premix        2  g 200 mL/hr over 30 Minutes Intravenous To Radiology 04/12/20 1454 04/13/20 0834       DVT prophylaxis: Heparin  Code Status: Full code  Family Communication: No family at bedside    Status is: Inpatient  Dispo: The patient is from: Home              Anticipated d/c is to: Skilled nursing facility              Anticipated d/c date is: 05/02/2020              Patient currently medically stable for discharge  Barrier to discharge-awaiting placement at skilled nursing facility     Consultants:  Vascular surgery  Nephrology  Procedures: AV fistula formation left arm   Objective   Vitals:   05/01/20 0700 05/01/20 0713 05/01/20 0730 05/01/20 0800  BP: (!) 162/76 (!) 162/77 138/74 (!) 141/63  Pulse: 87 87 93 96  Resp: 18     Temp: 98.2 F (36.8 C)     TempSrc: Oral     SpO2: 98%     Weight: 83 kg     Height:        Intake/Output Summary (Last 24 hours) at 05/01/2020 0820 Last data filed at 04/30/2020 1945 Gross per 24 hour  Intake 117 ml  Output 1000 ml  Net -883 ml    08/08 1901 - 08/10 0700 In: 357 [P.O.:357] Out: 1000 [Urine:1000]  Filed Weights   04/30/20 0118 05/01/20 0500 05/01/20 0700  Weight: 74 kg 78 kg 83 kg    Physical Examination:    General:  Appears in no acute distress  Cardiovascular: S1S2 RRR, no murmur auscultated  Respiratory: clear bilaterally, no wheezing or crackles  Abdomen: soft, nontender, no organomegaly  Extremities: No edema in lower extremities   Neurologic:  Ao x 3, contracture in RUE    Data Reviewed:   Recent Results (from the past 240 hour(s))  SARS CORONAVIRUS 2 (TAT 6-24 HRS) Nasopharyngeal Nasopharyngeal Swab  Status: None   Collection Time: 04/30/20  4:55 AM   Specimen: Nasopharyngeal Swab  Result Value Ref Range Status   SARS Coronavirus 2 NEGATIVE NEGATIVE Final    Comment: (NOTE) SARS-CoV-2 target nucleic acids are NOT DETECTED.  The SARS-CoV-2 RNA is generally detectable in upper and  lower respiratory specimens during the acute phase of infection. Negative results do not preclude SARS-CoV-2 infection, do not rule out co-infections with other pathogens, and should not be used as the sole basis for treatment or other patient management decisions. Negative results must be combined with clinical observations, patient history, and epidemiological information. The expected result is Negative.  Fact Sheet for Patients: SugarRoll.be  Fact Sheet for Healthcare Providers: https://www.woods-mathews.com/  This test is not yet approved or cleared by the [place] FDA and  has been authorized for detection and/or diagnosis of SARS-CoV-2 by FDA under an Emergency Use Authorization (EUA). This EUA will remain  in effect (meaning this test can be used) for the duration of the COVID-19 declaration under Se ction 564(b)(1) of the Act, 21 U.S.C. section 360bbb-3(b)(1), unless the authorization is terminated or revoked sooner.  Performed at [facility], [place] [address] [place], [place] [address]      ProBNP (last 3 results) Recent Labs    06/22/19 1505  PROBNP 170.0*      [place]   [facility] If 7PM-7AM, please contact night-coverage at [email or web address], Office  [phone]   05/01/2020, 8:20 AM  LOS: 21 days

## 2020-05-01 NOTE — Progress Notes (Signed)
PT Cancellation Note  Patient Details Name: Richard [name] MRN: [id number] DOB: June 25, 1956   Cancelled Treatment:    Reason Eval/Treat Not Completed: Patient declined, no reason specified Pt very agitated and PT spent extensive time attempting to get pt to participate and educating about benefits of participating with PT. RN also in room attempting to get pt to participate. Pt becoming more and more agitated and afraid pt would become aggressive so did not attempt further. Will follow up as schedule allows.   [name], PT, DPT  Acute Rehabilitation Services  Pager: [phone] Office: [phone]    [name] 05/01/2020, 2:29 PM

## 2020-05-01 NOTE — Progress Notes (Signed)
Nutrition Follow-up  DOCUMENTATION CODES:   Non-severe (moderate) malnutrition in context of chronic illness  INTERVENTION:  Continue Rena-vit daily   Continue Nepro Shake poTID, each supplement provides 425 kcal and 19 grams protein  Continue Magic cup TID with meals, each supplement provides 290 kcal and 9 grams of protein   NUTRITION DIAGNOSIS:   Moderate Malnutrition related to chronic illness (CKDV/ESRD starting HD) as evidenced by mild muscle depletion, mild fat depletion.  Ongoing  GOAL:   Patient will meet greater than or equal to 90% of their needs  Progressing  MONITOR:   Supplement acceptance, PO intake, Diet advancement, Weight trends, Labs, I & O's  REASON FOR ASSESSMENT:   Consult Enteral/tube feeding initiation and management  ASSESSMENT:   Pt admitted with acute metabolic encephalopathy, likely mulitfactorial including seizure, HTN, acute stroke, uremia, and possible CNS infection. PMH includes DM, OA, HTN, CKD V, neuropathies, previous CVA, hx seizures, and poor compliance (has previously refused dialysis and is noncompliant with medications). Pt now has ESRD with acidosis and some uremia and has agreed to beginning dialysis.  7/23 - Cortrak placed (gastric); s/p tunneled dialysis catheter placement  7/24 - s/p LP, negative cryptococcal antigen 7/27 - diet advanced to dysphagia 3 with thin liquids 7/28 Cortrak removed, diet advanced to regular 8/9 pt's diet liberalized to regular  Pt passed out during HD on 8/5 (second episode since admit), but has not had another episode since per MD. Pt to d/c to SNF.   Pt's appetite significantly improved.  PO Intake: 75-100% x last 8 recorded meals (96% average meal intake)  Last HD 8/10, net UF 2045ml   Labs: CBGs [id number] Medications: Aranesp, Nepro TID, Novolog, Lantus, Rena-vit, Protonix  Diet Order:   Diet Order            Diet regular Room service appropriate? Yes; Fluid consistency: Thin   Diet effective now                 EDUCATION NEEDS:   No education needs have been identified at this time  Skin:  Skin Assessment: Skin Integrity Issues: Skin Integrity Issues:: Incisions Incisions: L arm  Last BM:  8/9 type 5  Height:   Ht Readings from Last 1 Encounters:  04/23/20 6' (1.829 m)    Weight:   Wt Readings from Last 1 Encounters:  05/01/20 80 kg    BMI:  Body mass index is 23.92 kg/m.  Estimated Nutritional Needs:   Kcal:  7619-5093  Protein:  115-130 grams  Fluid:  1055ml + UOP    Larkin Ina, MS, RD, LDN RD pager number and weekend/on-call pager number located in [place].

## 2020-05-01 NOTE — Plan of Care (Signed)

## 2020-05-02 LAB — GLUCOSE, CAPILLARY
Glucose-Capillary: 94 mg/dL (ref 70–99)
Glucose-Capillary: 76 mg/dL (ref 70–99)
Glucose-Capillary: 276 mg/dL — ABNORMAL HIGH (ref 70–99)
Glucose-Capillary: 92 mg/dL (ref 70–99)
Glucose-Capillary: 126 mg/dL — ABNORMAL HIGH (ref 70–99)

## 2020-05-02 NOTE — TOC Progression Note (Signed)
Transition of Care [facility]) - Progression Note    Patient Details  Name: Richard [name] MRN: [id number] Date of Birth: 03/27/1956  Transition of Care [facility]) CM/SW [place], [place] Phone Number: 05/02/2020, 4:09 PM  Clinical Narrative:   CSW received notification that [facility] has asked for peer to peer, as patient is not participating in therapy. MD agreeable to peer to peer review. CSW coordinated peer to peer.  CSW then notified by MD that [facility] would like to see a PT note with patient agreeable to participate. CSW contacted Acute Rehab to ask for someone to pick up the patient. PT attempted to see patient, and he refused to participate. [employer] has denied the case for SNF admission. CSW spoke with [facility] admissions, and they are unable to accept the patient under his Medicaid.  CSW attempting to locate a Medicaid bed for the patient at this time. CSW to follow.    Expected Discharge Plan: [place] Barriers to Discharge: Continued Medical Work up, Waiting for outpatient dialysis, Transportation, Ship broker  Expected Discharge Plan and Services Expected Discharge Plan: [place] Choice: [place] arrangements for the past 2 months: Single Family Home                                       Social Determinants of Health (SDOH) Interventions    Readmission Risk Interventions Readmission Risk Prevention Plan 02/15/2020  Transportation Screening Complete  PCP or Specialist Appt within 5-7 Days Not Complete  Not Complete comments pending disposition  Home Care Screening Complete  Medication Review (RN CM) Referral to Pharmacy  Some recent data might be hidden

## 2020-05-02 NOTE — Progress Notes (Signed)
[facility] Progress Note   64 y.o.yo malewith HIV/SZ/advanced CKD as well as noncompliancewho was admitted on7/20/2021with sz but also found to progress to ESRD and has had dialysis initiated this hosp  Assessment/ Plan:   1. sz-Per primary team- Anti sz meds as he will consent 2. ESRD- new diagnosis this hospital admit. Have initiated HD- AVF placed 8/4. Has spot at [facility] on TTS - second shift, running here now TTS schedule- Had episode in HD on Thurs-Thatwas the second such episode;HD here went well on Saturday so feel better about his stability at OP center so feel like is OK to start planning for d/c to SNF  Last  HD Tues  Next HD Thursday; need to make sure he will be able to dialyze in a chair. Req attempt tomorrow to use chair; i'm not convinced we will be able to . He was very uncomfortable and restless on hd tues.  3. Anemia-iron repletion in progress- Also ESA- inc dose for hgb in the 8's  4. Secondary hyperparathyroidism- PTH 160- calc /phos WNL- on no meds  5. HTN/volume-better- Refusing meds at times- On reg of norvasc 10, coreg 3.125 (may inc), then hydralazine and labetalol PRN (has not needed) 6. HIV- Biktarvy  7. FTT- Albumin under 3 and will need SNF placement at discharge  8. Dispo- Difficult situation- Will need SNF at d/c- Palliative speaking with family that have been hesitant to make decisions re dispo and DNR- Seems will be followed by palliative at discharge  Subjective:   Denies f/c/n/v/dyspnea. Feels better today. Good appetite   Objective:   BP (!) 158/72 (BP Location: Right Arm)   Pulse 84   Temp 98.7 F (37.1 C) (Oral)   Resp 18   Ht 6' (1.829 m)   Wt 75 kg   SpO2 97%   BMI 22.42 kg/m   Intake/Output Summary (Last 24 hours) at 05/02/2020 0930 Last data filed at 05/01/2020 1900 Gross per 24 hour  Intake 360 ml  Output 2014 ml  Net -1654 ml   Weight change: 2 kg  Physical  Exam: General:alert, talkative Heart:  RRR Lungs: mostly clear Abdomen: soft, non tender Extremities: no edema Dialysis Access: right sided TDC new left BCF - Good thrill and bruit  Imaging: No results found.  Labs: BMET Recent Labs  Lab 04/26/20 0638 04/27/20 0330 04/28/20 1422 05/01/20 0723  NA 138 138 137 139  K 4.8 4.7 4.4 4.1  CL 101 99 100 101  CO2 25 26 25 26   GLUCOSE 114* 197* 164* 146*  BUN 49* 29* 42* 43*  CREATININE 7.57* 5.25* 7.51* 6.66*  CALCIUM 8.8* 8.3* 8.2* 8.3*  PHOS 5.1*  --  3.3 2.9   CBC Recent Labs  Lab 04/26/20 0638 04/28/20 1422 05/01/20 0723  WBC 7.6 6.8 6.1  HGB 8.9* 7.5* 7.2*  HCT 27.3* 23.7* 23.1*  MCV 92.5 95.2 97.5  PLT 205 185 224    Medications:    . amLODipine  10 mg Oral Daily  . bictegravir-emtricitabine-tenofovir AF  1 tablet Oral Daily  . carvedilol  3.125 mg Oral BID WC  . Chlorhexidine Gluconate Cloth  6 each Topical Q0600  . Chlorhexidine Gluconate Cloth  6 each Topical Q0600  . clopidogrel  75 mg Oral Daily  . darbepoetin (ARANESP) injection - DIALYSIS  150 mcg Intravenous Q Thu-HD  . feeding supplement (NEPRO CARB STEADY)  237 mL Oral TID BM  . heparin injection (subcutaneous)  5,000 Units Subcutaneous Q8H  .  insulin aspart  0-9 Units Subcutaneous TID WC  . insulin glargine  5 Units Subcutaneous QHS  . levETIRAcetam  500 mg Oral Q12H  . lidocaine-EPINEPHrine  20 mL Intradermal Once  . LORazepam  2 mg Intravenous Once  . multivitamin  1 tablet Oral QHS  . pantoprazole  40 mg Oral Daily  . pravastatin  80 mg Oral QHS  . sulfamethoxazole-trimethoprim  1 tablet Oral Once per day on Mon Wed Fri  . tamsulosin  0.4 mg Oral Daily      Otelia Santee, MD 05/02/2020, 9:30 AM

## 2020-05-02 NOTE — Progress Notes (Signed)
PROGRESS NOTE    Richard [name]  EXB:[id number] DOB: [birth year] DOA: 04/10/2020 PCP: [name], MD    Brief Narrative:  Patient admitted to the hospital with metabolic encephalopathy due to seizures, hypertensive emergency, acute stroke and uremia.  64 year old male with prolonged hospitalization, he does have the significant past medical history for type 2 diabetes mellitus, osteoarthritis, hypertension, chronic kidney disease, history of CVA with residual right-sided weakness who presented with seizures.  Apparently noncompliant with his seizure medications, he was witnessed to have a generalized seizures for about 4 minutes.  In the emergency department he was postictal, slowly recovered his mentation.  Blood pressure 216/115, heart rate 89, temperature 98.3, respiratory rate 21, oxygen saturation 98%, his lungs were clear to auscultation bilaterally, heart S1-S2, present rhythmic, soft abdomen, no lower extremity edema, his mentation improved, he was alert, oriented x3, nonfocal.  Patient was admitted and loaded with Keppra with good response.  Further work-up with brain MRI showed 2 mm acute to early subacute infarct within the left parietal lobe.  Lumbar puncture was negative for infection.  Slow recover from encephalopathy, he did required tube feedings per NG tube during his hospitalization.   His seizures have been controlled with Keppra and Vimpat, follow EEG no active seizures, now has been changed to monotherapy with Keppra.   Patient continued to have worsening kidney function, and right intrajugular vein catheter was placed and patient underwent hemodialysis.  On 08/04 he had a left radiocephalic AV fistula creation.  His HIV was positive and patient was started treatment with anti-retroviral therapy.    Assessment & Plan:   Principal Problem:   Encephalopathy Active Problems:   Tobacco abuse   Malnutrition of moderate degree (HCC)   Hyperlipidemia    Neuropathy in diabetes (HCC)   GERD (gastroesophageal reflux disease)   Diabetes mellitus, type II, insulin dependent (HCC)   Altered mental status   CKD (chronic kidney disease) stage 5, GFR less than 15 ml/min (HCC)   History of cerebrovascular accident (CVA) with residual deficit   Seizure ([place])   AIDS (acquired immune deficiency syndrome) (HCC)   ESRD (end stage renal disease) ([place])   Creatinine elevation   Goals of care, counseling/discussion   Hypertensive urgency   Meningoencephalitis   Palliative care by specialist   Weakness   Encounter for feeding tube placement   1. Acute metabolic encephalopathy. Clinically patient has improved and his encephalopathy has resolved. Continue to be very weak and deconditioned.   Patient will need SNF for further care.   Will dc hydrocodone (last used 08/05). Change level of care to Med surg, discontinue telemetry monitoring.   2. Acute (2 mm acute to early subacute infarct within the left parietal lobe) on chronic CVA/ chronic right sided weakness and right upper extremity contracture.   Patient with chronic right sided weakness and ambulatory dysfunction. Will continue physical therapy and occupation therapy evaluations.  Antiplatelet therapy with asa and clopidogrel. Continue pravastatin   Continue care at [facility].  3. Recurrent seizures. Patient with no further seizures, continue with Keppra.   4. ESRD on HD. Now patient receiving renal replacement therapy with HD. Has a right IJ tunneled HD catheter in placed and a left upper extremity AV fistula has been created.  Plan for next HD 05/03/20, chair HD   5. HTN/ hypertensive emergency. Blood pressure this am 158/72, continue blood pressure control with amlodipine and carvedilol.   For now will dc hydralazine and labetalol PRN dosing.   6.  Controlled T2DM Hgb A1c 5,2/ dyslipidemia. Continue glucose covera and monitoring with insulin sliding scale. Patient is tolerating po well.   Basal  insulin 5 units glargine.   Continue with pravastatin 80 mg daily.   7. HIV. CD 39, viral load 624,000. Continue antiretroviral therapy. PCP prophylaxis with bactrim.    8. Psoriasis dermatitis. Sp skin biopsy 07/30. Continue local skin care.   9. Moderate calorie protein malnutrition and anemia of chronic renal disease. Continue with nutritional supplements. Continue with darbepoetin.   Hgb is 7,2 with Hct at 23,1. Holding prbc transfusion for now. Limit blood sampling.     Status is: Inpatient  Remains inpatient appropriate because:Inpatient level of care appropriate due to severity of illness   Dispo: The patient is from: Home              Anticipated d/c is to: SNF              Anticipated d/c date is: 1 day              Patient currently is not medically stable to d/c.   DVT prophylaxis: Heparin   Code Status:   full  Family Communication:  No family at the bedside      Nutrition Status: Nutrition Problem: Moderate Malnutrition Etiology: chronic illness (CKDV/ESRD starting HD) Signs/Symptoms: mild muscle depletion, mild fat depletion Interventions: Magic cup, Nepro shake     Consultants:   Nephrology   Neurology   Procedures:   Right IJ HD catheter  Left upper extremitiy AV fistula    Subjective: No chest pain or dyspnea, continue to be very weak and deconditioned,   Objective: Vitals:   05/01/20 1950 05/02/20 0310 05/02/20 0500 05/02/20 0716  BP: (!) 109/53 (!) 143/73  (!) 158/72  Pulse: 96 90  84  Resp: 17 18  18   Temp: 98.4 F (36.9 C) 98.1 F (36.7 C)  98.7 F (37.1 C)  TempSrc: Oral Oral  Oral  SpO2: 95% 96%  97%  Weight:   75 kg   Height:        Intake/Output Summary (Last 24 hours) at 05/02/2020 0834 Last data filed at 05/01/2020 1900 Gross per 24 hour  Intake 360 ml  Output 2014 ml  Net -1654 ml   Filed Weights   05/01/20 0700 05/01/20 1115 05/02/20 0500  Weight: 83 kg 80 kg 75 kg    Examination:   General: Not in pain or  dyspnea Neurology: Awake and alert, right sided weakness, right upper extremity contracture E ENT: no pallor, no icterus, oral mucosa moist Cardiovascular: No JVD. S1-S2 present, rhythmic, no gallops, rubs, or murmurs. No lower extremity edema. Pulmonary: vesicular breath sounds bilaterally, adequate air movement, no wheezing, rhonchi or rales. Gastrointestinal. Abdomen soft and non tender Skin. No rashes Musculoskeletal: no joint deformities     Data Reviewed: I have personally reviewed following labs and imaging studies  CBC: Recent Labs  Lab 04/26/20 0638 04/28/20 1422 05/01/20 0723  WBC 7.6 6.8 6.1  HGB 8.9* 7.5* 7.2*  HCT 27.3* 23.7* 23.1*  MCV 92.5 95.2 97.5  PLT 205 185 962   Basic Metabolic Panel: Recent Labs  Lab 04/26/20 0638 04/27/20 0330 04/28/20 1422 05/01/20 0723  NA 138 138 137 139  K 4.8 4.7 4.4 4.1  CL 101 99 100 101  CO2 25 26 25 26   GLUCOSE 114* 197* 164* 146*  BUN 49* 29* 42* 43*  CREATININE 7.57* 5.25* 7.51* 6.66*  CALCIUM 8.8* 8.3* 8.2*  8.3*  PHOS 5.1*  --  3.3 2.9   GFR: Estimated Creatinine Clearance: 12.2 mL/min (A) (by C-G formula based on SCr of 6.66 mg/dL (H)). Liver Function Tests: Recent Labs  Lab 04/26/20 3151 04/28/20 1422 05/01/20 0723  ALBUMIN 2.5* 2.3* 2.3*   No results for input(s): LIPASE, AMYLASE in the last 168 hours. No results for input(s): AMMONIA in the last 168 hours. Coagulation Profile: No results for input(s): INR, PROTIME in the last 168 hours. Cardiac Enzymes: No results for input(s): CKTOTAL, CKMB, CKMBINDEX, TROPONINI in the last 168 hours. BNP (last 3 results) Recent Labs    06/22/19 1505  PROBNP 170.0*   HbA1C: No results for input(s): HGBA1C in the last 72 hours. CBG: Recent Labs  Lab 05/01/20 1650 05/01/20 2000 05/01/20 2331 05/02/20 0314 05/02/20 0733  GLUCAP 218* 192* 235* 94 76   Lipid Profile: No results for input(s): CHOL, HDL, LDLCALC, TRIG, CHOLHDL, LDLDIRECT in the last 72  hours. Thyroid Function Tests: No results for input(s): TSH, T4TOTAL, FREET4, T3FREE, THYROIDAB in the last 72 hours. Anemia Panel: No results for input(s): VITAMINB12, FOLATE, FERRITIN, TIBC, IRON, RETICCTPCT in the last 72 hours.    Radiology Studies: I have reviewed all of the imaging during this hospital visit personally     Scheduled Meds: . amLODipine  10 mg Oral Daily  . bictegravir-emtricitabine-tenofovir AF  1 tablet Oral Daily  . carvedilol  3.125 mg Oral BID WC  . Chlorhexidine Gluconate Cloth  6 each Topical Q0600  . Chlorhexidine Gluconate Cloth  6 each Topical Q0600  . clopidogrel  75 mg Oral Daily  . darbepoetin (ARANESP) injection - DIALYSIS  150 mcg Intravenous Q Thu-HD  . feeding supplement (NEPRO CARB STEADY)  237 mL Oral TID BM  . heparin injection (subcutaneous)  5,000 Units Subcutaneous Q8H  . insulin aspart  0-9 Units Subcutaneous TID WC  . insulin glargine  5 Units Subcutaneous QHS  . levETIRAcetam  500 mg Oral Q12H  . lidocaine-EPINEPHrine  20 mL Intradermal Once  . LORazepam  2 mg Intravenous Once  . multivitamin  1 tablet Oral QHS  . pantoprazole  40 mg Oral Daily  . pravastatin  80 mg Oral QHS  . sulfamethoxazole-trimethoprim  1 tablet Oral Once per day on Mon Wed Fri  . tamsulosin  0.4 mg Oral Daily   Continuous Infusions: . ferric gluconate (FERRLECIT/NULECIT) IV Stopped (04/20/20 1419)     LOS: 22 days        Kaylia Winborne Gerome Apley, MD

## 2020-05-02 NOTE — TOC Progression Note (Signed)
Transition of Care [facility]) - Progression Note    Patient Details  Name: Richard [name] MRN: [id number] Date of Birth: 1956-07-25  Transition of Care [facility]) CM/SW [place], [place] Phone Number: 05/02/2020, 4:12 PM  Clinical Narrative:   CSW received Medicaid bed offer for patient at [facility], [place], and [place]. CSW contacted daughter to update her on the [employer] denial and the need to pursue Medicaid placement, and daughter said she would talk to her dad to get his head straight. CSW assisted with getting the patient to answer the phone to talk to his daughter. Daughter said that the patient told her that he only refused because he hadn't eaten yet, and that he will work with therapy if he has eaten first. CSW to attempt to get therapy to come with the patient after he has eaten lunch tomorrow. If patient refuses therapy again, daughter will need to choose a Medicaid bed for the patient. CSW to follow.    Expected Discharge Plan: [place] Barriers to Discharge: Continued Medical Work up, Waiting for outpatient dialysis, Transportation, Ship broker  Expected Discharge Plan and Services Expected Discharge Plan: [place] Choice: [place] arrangements for the past 2 months: Single Family Home                                       Social Determinants of Health (SDOH) Interventions    Readmission Risk Interventions Readmission Risk Prevention Plan 02/15/2020  Transportation Screening Complete  PCP or Specialist Appt within 5-7 Days Not Complete  Not Complete comments pending disposition  Home Care Screening Complete  Medication Review (RN CM) Referral to Pharmacy  Some recent data might be hidden

## 2020-05-03 LAB — GLUCOSE, CAPILLARY
Glucose-Capillary: 119 mg/dL — ABNORMAL HIGH (ref 70–99)
Glucose-Capillary: 232 mg/dL — ABNORMAL HIGH (ref 70–99)
Glucose-Capillary: 166 mg/dL — ABNORMAL HIGH (ref 70–99)
Glucose-Capillary: 184 mg/dL — ABNORMAL HIGH (ref 70–99)
Glucose-Capillary: 301 mg/dL — ABNORMAL HIGH (ref 70–99)
Glucose-Capillary: 316 mg/dL — ABNORMAL HIGH (ref 70–99)

## 2020-05-03 LAB — CBC
HCT: 24 % — ABNORMAL LOW (ref 39.0–52.0)
Hemoglobin: 7.3 g/dL — ABNORMAL LOW (ref 13.0–17.0)
MCH: 30.3 pg (ref 26.0–34.0)
MCHC: 30.4 g/dL (ref 30.0–36.0)
MCV: 99.6 fL (ref 80.0–100.0)
Platelets: 177 10*3/uL (ref 150–400)
RBC: 2.41 MIL/uL — ABNORMAL LOW (ref 4.22–5.81)
RDW: 17.8 % — ABNORMAL HIGH (ref 11.5–15.5)
WBC: 3.9 10*3/uL — ABNORMAL LOW (ref 4.0–10.5)
nRBC: 0 % (ref 0.0–0.2)

## 2020-05-03 LAB — RENAL FUNCTION PANEL
Albumin: 2.5 g/dL — ABNORMAL LOW (ref 3.5–5.0)
Anion gap: 11 (ref 5–15)
BUN: 26 mg/dL — ABNORMAL HIGH (ref 8–23)
CO2: 27 mmol/L (ref 22–32)
Calcium: 8.3 mg/dL — ABNORMAL LOW (ref 8.9–10.3)
Chloride: 103 mmol/L (ref 98–111)
Creatinine, Ser: 4.92 mg/dL — ABNORMAL HIGH (ref 0.61–1.24)
GFR calc Af Amer: 14 mL/min — ABNORMAL LOW (ref >60)
GFR calc non Af Amer: 12 mL/min — ABNORMAL LOW (ref >60)
Glucose, Bld: 193 mg/dL — ABNORMAL HIGH (ref 70–99)
Phosphorus: 2.6 mg/dL (ref 2.5–4.6)
Potassium: 4 mmol/L (ref 3.5–5.1)
Sodium: 141 mmol/L (ref 135–145)

## 2020-05-03 MED ORDER — TRAZODONE HCL 50 MG PO TABS
50.0000 mg | ORAL_TABLET | Freq: Every day | ORAL | Status: DC
  Administered 2020-05-03 – 2020-05-05 (×3): 50 mg via ORAL
  Filled 2020-05-03 (×3): qty 1

## 2020-05-03 MED ORDER — INSULIN ASPART 100 UNIT/ML ~~LOC~~ SOLN
0.0000 [IU] | Freq: Every day | SUBCUTANEOUS | Status: DC
  Administered 2020-05-03: 4 [IU] via SUBCUTANEOUS
  Administered 2020-05-05 – 2020-05-07 (×2): 2 [IU] via SUBCUTANEOUS

## 2020-05-03 MED ORDER — HEPARIN SODIUM (PORCINE) 1000 UNIT/ML IJ SOLN
INTRAMUSCULAR | Status: AC
  Administered 2020-05-03: 1000 [IU]
  Filled 2020-05-03: qty 4

## 2020-05-03 MED ORDER — DARBEPOETIN ALFA 150 MCG/0.3ML IJ SOSY
PREFILLED_SYRINGE | INTRAMUSCULAR | Status: AC
  Filled 2020-05-03: qty 0.3

## 2020-05-03 MED ORDER — INSULIN ASPART 100 UNIT/ML ~~LOC~~ SOLN
0.0000 [IU] | Freq: Three times a day (TID) | SUBCUTANEOUS | Status: DC
Start: 2020-05-04 — End: 2020-05-03

## 2020-05-03 NOTE — Progress Notes (Signed)
PROGRESS NOTE    Richard [name]  HMC:[id number] DOB: 1956-04-10 DOA: 04/10/2020 PCP: [name], MD    Brief Narrative:  Patient admitted to the hospital with metabolic encephalopathy due to seizures, hypertensive emergency, acute stroke and uremia.  64 year old male with prolonged hospitalization, he does have the significant past medical history for type 2 diabetes mellitus, osteoarthritis, hypertension, chronic kidney disease, history of CVA with residual right-sided weakness who presented with seizures.  Apparently noncompliant with his seizure medications, he was witnessed to have a generalized seizures for about 4 minutes.  In the emergency department he was postictal, slowly recovered his mentation.  Blood pressure 216/115, heart rate 89, temperature 98.3, respiratory rate 21, oxygen saturation 98%, his lungs were clear to auscultation bilaterally, heart S1-S2, present rhythmic, soft abdomen, no lower extremity edema, his mentation slowly improved, and by the time of his amdmission he was alert, oriented x3, and nonfocal.  Sodium 143, potassium 4.2, chloride 115, bicarb 13, glucose 106, BUN 76, creatinine 9.98, 0.6, hemoglobin 10.3, hematocrit 31.5, platelets 192. Head CT with no acute intracranial process.  Patient was admitted and loaded with Keppra with good response.  Further work-up with brain MRI showed 2 mm acute to early subacute infarct within the left parietal lobe.  Lumbar puncture was negative for infection.  Patient had recurrent encephalopathy and altered mental status that was slow to  recover. He did required tube feedings per NG tube during his hospitalization.   His seizures have been controlled with Keppra and Vimpat, follow EEG no active seizures, now has been changed to monotherapy with Keppra.   Patient continued to have worsening kidney function, and right intrajugular vein catheter was placed and patient underwent hemodialysis.  On 08/04 he had  a left radiocephalic AV fistula creation.  His HIV was positive and patient was started treatment with anti-retroviral therapy.    Assessment & Plan:   Principal Problem:   Encephalopathy Active Problems:   Tobacco abuse   Malnutrition of moderate degree (HCC)   Hyperlipidemia   Neuropathy in diabetes (HCC)   GERD (gastroesophageal reflux disease)   Diabetes mellitus, type II, insulin dependent (HCC)   History of cerebrovascular accident (CVA) with residual deficit   Seizure ([place])   AIDS (acquired immune deficiency syndrome) ([place])   ESRD (end stage renal disease) ([place])   Goals of care, counseling/discussion   Hypertensive urgency   Palliative care by specialist   Encounter for feeding tube placement    1. Acute metabolic encephalopathy. Clinically patient has improved and his encephalopathy has resolved.   Patient will need SNF for further care. He continue very weak and deconditioned.   2. Acute (2 mm acute to early subacute infarct within the left parietal lobe) on chronic CVA/ chronic right sided weakness and right upper extremity contracture.   Patient with chronic right sided weakness and ambulatory dysfunction.   On dual antiplatelet therapy with asa and clopidogrel. On pravastatin   Continue care at [facility]  3. Recurrent seizures. Patient with no clinical signs of further seizures, tolerating well Keppra 500 mg bid.  Patient with insomnia, will add trazodone qhs.   4. ESRD on HD. Now patient receiving renal replacement therapy with HD. Has a right IJ tunneled HD catheter in placed and a left upper extremity AV fistula has been created.  Scheduled for HD today, will for possible chair HD.   5. HTN/ hypertensive emergency. This am his blood pressure has been 145/71, continue amlodipine and carvedilol.   6.  Controlled T2DM Hgb A1c 5,2/ dyslipidemia. Capillary glucose this am 232 and 166, will continue glucose coverage and monitoring with insulin sliding  scale.   Continue basal insulin 5 units glargine. Patient is tolerating po well.   On pravastatin 80 mg daily.   7. HIV. CD 39, viral load 624,000. Continue antiretroviral therapy. PCP prophylaxis with bactrim.    Will need outpatient follow up.   8. Psoriasis dermatitis. Sp skin biopsy 07/30. Continue local skin care.   9. Moderate calorie protein malnutrition and anemia of chronic renal disease/ iron deficiency. On darbepoetin plus iron supplementation.   Last Hgb is 7,2 with Hct at 23,1 on 08.10.21  Follow cell count as needed.    Status is: Inpatient  Remains inpatient appropriate because:Unsafe d/c plan   Dispo: The patient is from: Home              Anticipated d/c is to: SNF              Anticipated d/c date is: 1 day              Patient currently is medically stable to d/c.   DVT prophylaxis:  heparin   Code Status:   full  Family Communication:  No family at the bedside      Nutrition Status: Nutrition Problem: Moderate Malnutrition Etiology: chronic illness (CKDV/ESRD starting HD) Signs/Symptoms: mild muscle depletion, mild fat depletion Interventions: Magic cup, Nepro shake     Consultants:   Nephrology   Neurology   Procedures:   Right IJ HD catheter  Left upper extremitiy AV fistula     Subjective: Patient is feeling better today, but continue to be very weak and deconditioned, no nausea or vomiting   Objective: Vitals:   05/02/20 2308 05/03/20 0309 05/03/20 0500 05/03/20 0738  BP: (!) 150/77 (!) 167/73  (!) 145/71  Pulse: 85 87  89  Resp: 18 18  18   Temp: 98.5 F (36.9 C) 98.1 F (36.7 C)  98.3 F (36.8 C)  TempSrc: Oral Oral  Oral  SpO2: 96%   97%  Weight:   71 kg   Height:        Intake/Output Summary (Last 24 hours) at 05/03/2020 0851 Last data filed at 05/02/2020 2300 Gross per 24 hour  Intake 366 ml  Output --  Net 366 ml   Filed Weights   05/01/20 1115 05/02/20 0500 05/03/20 0500  Weight: 80 kg 75 kg 71  kg    Examination:   General: Not in pain or dyspnea, deconditioned  Neurology: Awake and alert, non focal  E ENT: mild pallor, no icterus, oral mucosa moist Cardiovascular: No JVD. S1-S2 present, rhythmic, no gallops, rubs, or murmurs. No lower extremity edema. Pulmonary: positive breath sounds bilaterally,  Gastrointestinal. Abdomen soft and non tender Skin. No rashes Musculoskeletal: no joint deformities     Data Reviewed: I have personally reviewed following labs and imaging studies  CBC: Recent Labs  Lab 04/28/20 1422 05/01/20 0723  WBC 6.8 6.1  HGB 7.5* 7.2*  HCT 23.7* 23.1*  MCV 95.2 97.5  PLT 185 124   Basic Metabolic Panel: Recent Labs  Lab 04/27/20 0330 04/28/20 1422 05/01/20 0723  NA 138 137 139  K 4.7 4.4 4.1  CL 99 100 101  CO2 26 25 26   GLUCOSE 197* 164* 146*  BUN 29* 42* 43*  CREATININE 5.25* 7.51* 6.66*  CALCIUM 8.3* 8.2* 8.3*  PHOS  --  3.3 2.9   GFR: Estimated Creatinine  Clearance: 11.5 mL/min (A) (by C-G formula based on SCr of 6.66 mg/dL (H)). Liver Function Tests: Recent Labs  Lab 04/28/20 1422 05/01/20 0723  ALBUMIN 2.3* 2.3*   No results for input(s): LIPASE, AMYLASE in the last 168 hours. No results for input(s): AMMONIA in the last 168 hours. Coagulation Profile: No results for input(s): INR, PROTIME in the last 168 hours. Cardiac Enzymes: No results for input(s): CKTOTAL, CKMB, CKMBINDEX, TROPONINI in the last 168 hours. BNP (last 3 results) Recent Labs    06/22/19 1505  PROBNP 170.0*   HbA1C: No results for input(s): HGBA1C in the last 72 hours. CBG: Recent Labs  Lab 05/02/20 1106 05/02/20 1657 05/02/20 2014 05/03/20 0316 05/03/20 0736  GLUCAP 276* 92 126* 232* 166*   Lipid Profile: No results for input(s): CHOL, HDL, LDLCALC, TRIG, CHOLHDL, LDLDIRECT in the last 72 hours. Thyroid Function Tests: No results for input(s): TSH, T4TOTAL, FREET4, T3FREE, THYROIDAB in the last 72 hours. Anemia Panel: No results  for input(s): VITAMINB12, FOLATE, FERRITIN, TIBC, IRON, RETICCTPCT in the last 72 hours.    Radiology Studies: I have reviewed all of the imaging during this hospital visit personally     Scheduled Meds:  amLODipine  10 mg Oral Daily   bictegravir-emtricitabine-tenofovir AF  1 tablet Oral Daily   carvedilol  3.125 mg Oral BID WC   Chlorhexidine Gluconate Cloth  6 each Topical Q0600   Chlorhexidine Gluconate Cloth  6 each Topical Q0600   clopidogrel  75 mg Oral Daily   darbepoetin (ARANESP) injection - DIALYSIS  150 mcg Intravenous Q Thu-HD   feeding supplement (NEPRO CARB STEADY)  237 mL Oral TID BM   heparin injection (subcutaneous)  5,000 Units Subcutaneous Q8H   insulin aspart  0-9 Units Subcutaneous TID WC   insulin glargine  5 Units Subcutaneous QHS   levETIRAcetam  500 mg Oral Q12H   lidocaine-EPINEPHrine  20 mL Intradermal Once   multivitamin  1 tablet Oral QHS   pantoprazole  40 mg Oral Daily   pravastatin  80 mg Oral QHS   sulfamethoxazole-trimethoprim  1 tablet Oral Once per day on Mon Wed Fri   tamsulosin  0.4 mg Oral Daily   Continuous Infusions:  ferric gluconate (FERRLECIT/NULECIT) IV Stopped (04/20/20 1419)     LOS: 23 days        Richard [name] Gerome Apley, MD

## 2020-05-03 NOTE — Progress Notes (Signed)
PT Cancellation Note  Patient Details Name: Richard [name] MRN: [id number] DOB: 08/25/1956   Cancelled Treatment:    Reason Eval/Treat Not Completed: Patient at procedure or test/unavailable .  Pt in HD.  Will try to see as able and as pt allows. 05/03/2020  Ginger Carne., PT Acute Rehabilitation Services [phone]  (pager) [phone]  (office)  Richard [name] 05/03/2020, 1:36 PM

## 2020-05-03 NOTE — Progress Notes (Signed)
[facility] Progress Note   64 y.o.yo malewith HIV/SZ/advanced CKD as well as noncompliancewho was admitted on7/20/2021with sz but also found to progress to ESRD and has had dialysis initiated this hosp  Assessment/ Plan:   1. sz-Per primary team- Anti sz meds as he will consent 2. ESRD- new diagnosis this hospital admit. Have initiated HD- AVF placed 8/4. Has spot at [facility] on TTS - second shift, running here now TTS schedule- Had episode in HD on Thurs-Thatwas the second such episode;HD here went well on Saturday so feel better about his stability at OP center so feel like is OK to start planning for d/c to SNF  Seen on  HD in a chair; he's comfortable appearing with no complaints 3K  150/55 neg 2L UF goal No changes for now.  Next HD Saturday  HE IS TOLERATING HD IN A CHAIR; SO WILL BE A CANDIDATE FOR OUTPT DIALYSIS. [place] 2ND SHIFT  3. Anemia-iron repletion in progress- Also ESA- inc dose for hgb in the 8's  4. Secondary hyperparathyroidism- PTH 160- calc /phos WNL- on no meds  5. HTN/volume-better- Refusing meds at times- On reg of norvasc 10, coreg 3.125 (may inc), then hydralazine and labetalol PRN (has not needed) 6. HIV-Biktarvy 7. FTT- Albumin under 3 and will need SNF placement at discharge  8. Dispo- Difficult situation- Will need SNF at d/c- Palliative speaking with family that have been hesitant to make decisions re dispo and DNR- Seems will be followed by palliative at discharge  Subjective:   Denies f/c/n/v/dyspnea. Feels better today. Good appetite; asking for graham crackers   Objective:   BP (!) 156/67 (BP Location: Right Wrist)   Pulse 91   Temp 98.3 F (36.8 C) (Oral)   Resp 16   Ht 6' (1.829 m)   Wt 71 kg   SpO2 98%   BMI 21.23 kg/m   Intake/Output Summary (Last 24 hours) at 05/03/2020 1200 Last data filed at 05/03/2020 1045 Gross per 24 hour  Intake 606 ml  Output --  Net 606 ml   Weight  change: -9 kg  Physical Exam: General:alert, talkative Heart:RRR Lungs: mostly clear Abdomen: soft, non tender Extremities: no edema Dialysis Access: right sided TDC new leftBCF- Good thrill and bruit  Imaging: No results found.  Labs: BMET Recent Labs  Lab 04/27/20 0330 04/28/20 1422 05/01/20 0723  NA 138 137 139  K 4.7 4.4 4.1  CL 99 100 101  CO2 26 25 26   GLUCOSE 197* 164* 146*  BUN 29* 42* 43*  CREATININE 5.25* 7.51* 6.66*  CALCIUM 8.3* 8.2* 8.3*  PHOS  --  3.3 2.9   CBC Recent Labs  Lab 04/28/20 1422 05/01/20 0723  WBC 6.8 6.1  HGB 7.5* 7.2*  HCT 23.7* 23.1*  MCV 95.2 97.5  PLT 185 224    Medications:    . amLODipine  10 mg Oral Daily  . bictegravir-emtricitabine-tenofovir AF  1 tablet Oral Daily  . carvedilol  3.125 mg Oral BID WC  . Chlorhexidine Gluconate Cloth  6 each Topical Q0600  . Chlorhexidine Gluconate Cloth  6 each Topical Q0600  . clopidogrel  75 mg Oral Daily  . darbepoetin (ARANESP) injection - DIALYSIS  150 mcg Intravenous Q Thu-HD  . feeding supplement (NEPRO CARB STEADY)  237 mL Oral TID BM  . heparin injection (subcutaneous)  5,000 Units Subcutaneous Q8H  . insulin aspart  0-9 Units Subcutaneous TID WC  . insulin glargine  5 Units Subcutaneous QHS  .  levETIRAcetam  500 mg Oral Q12H  . lidocaine-EPINEPHrine  20 mL Intradermal Once  . multivitamin  1 tablet Oral QHS  . pantoprazole  40 mg Oral Daily  . pravastatin  80 mg Oral QHS  . sulfamethoxazole-trimethoprim  1 tablet Oral Once per day on Mon Wed Fri  . tamsulosin  0.4 mg Oral Daily  . traZODone  50 mg Oral QHS      Otelia Santee, MD 05/03/2020, 12:00 PM

## 2020-05-04 LAB — GLUCOSE, CAPILLARY
Glucose-Capillary: 88 mg/dL (ref 70–99)
Glucose-Capillary: 87 mg/dL (ref 70–99)
Glucose-Capillary: 212 mg/dL — ABNORMAL HIGH (ref 70–99)
Glucose-Capillary: 186 mg/dL — ABNORMAL HIGH (ref 70–99)
Glucose-Capillary: 179 mg/dL — ABNORMAL HIGH (ref 70–99)

## 2020-05-04 MED ORDER — SODIUM CHLORIDE 0.9 % IV SOLN
125.0000 mg | INTRAVENOUS | Status: DC
  Administered 2020-05-05: 125 mg via INTRAVENOUS
  Filled 2020-05-04 (×3): qty 10

## 2020-05-04 NOTE — Progress Notes (Signed)
[facility] Progress Note   64 y.o.yo malewith HIV/SZ/advanced CKD as well as noncompliancewho was admitted on7/20/2021with sz but also found to progress to ESRD and has had dialysis initiated this hosp  Assessment/ Plan:   1. sz-Per primary team- Anti sz meds as he will consent 2. ESRD- new diagnosis this hospital admit. Have initiated HD- AVF placed 8/4. Has spot at [facility] on TTS - second shift, running here now TTS schedule- Had episode in HD on Thurs-Thatwas the second such episode;HD here went well on Saturday so feel better about his stability at OP center so feel like is OK to start planning for d/c to SNF  Seen onHD in a chair; he's comfortable appearing with no complaints 3K  150/55 neg 2L UF goal No changes for now.  Next HD Saturday  HE IS TOLERATING HD IN A CHAIR; SO WILL BE A CANDIDATE FOR OUTPT DIALYSIS. [place] 2ND SHIFT  3. Anemia-iron repletion in progress- Also ESA- inc dose for hgb in the 8's  4. Secondary hyperparathyroidism- PTH 160- calc /phos WNL- on no meds  5. HTN/volume-better- Refusing meds at times- On reg of norvasc 10, coreg 3.125 (may inc), then hydralazine and labetalol PRN (has not needed) 6. HIV-Biktarvy 7. FTT- Albumin under 3 and will need SNF placement at discharge  8. Dispo- Difficult situation- Will need SNF at d/c- Palliative speaking with family that have been hesitant to make decisions re dispo and DNR- Seems will be followed by palliative at discharge  Subjective:   Denies f/c/n/v/dyspnea. Feels better today. Good appetite; asking for graham crackers   Objective:   BP (!) 141/70 (BP Location: Right Arm)   Pulse (!) 102   Temp 98 F (36.7 C) (Oral)   Resp 20   Ht 6' (1.829 m)   Wt 70 kg   SpO2 99%   BMI 20.93 kg/m   Intake/Output Summary (Last 24 hours) at 05/04/2020 1627 Last data filed at 05/04/2020 0500 Gross per 24 hour  Intake 390 ml  Output 350 ml  Net 40 ml   Weight  change: 0 kg  Physical Exam: General:alert, talkative Heart:RRR Lungs: mostly clear Abdomen: soft, non tender Extremities: no edema Dialysis Access: right sided TDC new leftBCF- Good thrill and bruit  Imaging: No results found.  Labs: BMET Recent Labs  Lab 04/28/20 1422 05/01/20 0723 05/03/20 1142  NA 137 139 141  K 4.4 4.1 4.0  CL 100 101 103  CO2 25 26 27   GLUCOSE 164* 146* 193*  BUN 42* 43* 26*  CREATININE 7.51* 6.66* 4.92*  CALCIUM 8.2* 8.3* 8.3*  PHOS 3.3 2.9 2.6   CBC Recent Labs  Lab 04/28/20 1422 05/01/20 0723 05/03/20 1728  WBC 6.8 6.1 3.9*  HGB 7.5* 7.2* 7.3*  HCT 23.7* 23.1* 24.0*  MCV 95.2 97.5 99.6  PLT 185 224 177    Medications:    . amLODipine  10 mg Oral Daily  . bictegravir-emtricitabine-tenofovir AF  1 tablet Oral Daily  . carvedilol  3.125 mg Oral BID WC  . Chlorhexidine Gluconate Cloth  6 each Topical Q0600  . Chlorhexidine Gluconate Cloth  6 each Topical Q0600  . clopidogrel  75 mg Oral Daily  . darbepoetin (ARANESP) injection - DIALYSIS  150 mcg Intravenous Q Thu-HD  . feeding supplement (NEPRO CARB STEADY)  237 mL Oral TID BM  . heparin injection (subcutaneous)  5,000 Units Subcutaneous Q8H  . insulin aspart  0-5 Units Subcutaneous QHS  . insulin aspart  0-9  Units Subcutaneous TID WC  . insulin glargine  5 Units Subcutaneous QHS  . levETIRAcetam  500 mg Oral Q12H  . lidocaine-EPINEPHrine  20 mL Intradermal Once  . multivitamin  1 tablet Oral QHS  . pantoprazole  40 mg Oral Daily  . pravastatin  80 mg Oral QHS  . sulfamethoxazole-trimethoprim  1 tablet Oral Once per day on Mon Wed Fri  . tamsulosin  0.4 mg Oral Daily  . traZODone  50 mg Oral QHS      Otelia Santee, MD 05/04/2020, 4:27 PM

## 2020-05-04 NOTE — Progress Notes (Signed)
Inpatient Diabetes Program Recommendations  AACE/ADA: New Consensus Statement on Inpatient Glycemic Control (2015)  Target Ranges:  Prepandial:   less than 140 mg/dL      Peak postprandial:   less than 180 mg/dL (1-2 hours)      Critically ill patients:  140 - 180 mg/dL   Lab Results  Component Value Date   GLUCAP 87 05/04/2020   HGBA1C 5.2 02/14/2020    Review of Glycemic Control Results for KIKO, RIPP (MRN [id number]) as of 05/04/2020 11:25  Ref. Range 05/03/2020 19:27 05/03/2020 22:49 05/04/2020 06:54  Glucose-Capillary Latest Ref Range: 70 - 99 mg/dL 301 (H) 316 (H) 88   Diabetes history: Type 2 DM Outpatient Diabetes medications: Novolog 70/30 28 units QAM Current orders for Inpatient glycemic control: Novolog 0-9 units TID, Novolog 0-5 units QHS, Lantus 5 units QHS  Inpatient Diabetes Program Recommendations:    Noted elevations of glucose trends during HD. Of note, patient missed lunch time dose due to being off the floor. Additonally, patient requesting regular diet.  If post prandials continue to remain elevated, consider adding Novolog 2 units TID (assuming patient is consuming >50% of meal).   Thanks, Bronson Curb, MSN, RNC-OB Diabetes Coordinator [phone] (8a-5p)

## 2020-05-04 NOTE — Progress Notes (Addendum)
PT Cancellation Note  Patient Details Name: Richard [name] MRN: [id number] DOB: 09/04/1956   Cancelled Treatment:    Reason Eval/Treat Not Completed: Patient declined, no reason specified; attempted treatment this pm with OT, pt refused despite multiple attempts at encouragement including education about need to show participation for SNF rehab, RN encouragement, SW and calling his daughter.  Patient seems to feel dis empowered and using his ability to control therapy sessions as one way to keep control.  Will continue attempts.    Reginia Naas 05/04/2020, 3:43 PM  Magda Kiel, PT Acute Rehabilitation Services [phone] Office:[phone] 05/04/2020

## 2020-05-04 NOTE — Progress Notes (Signed)
OT Cancellation Note  Patient Details Name: Richard [name] MRN: [id number] DOB: 1956-09-21   Cancelled Treatment:    Reason Eval/Treat Not Completed: Patient declined, no reason specified Attempted to see pt with PT as this would be his final chance to receive rehab services upon discharge due to lack of participation. Pt seen on a non-dailysis day and after lunch per his request and pt continued to refuse. Pt kept his eyes closed for the majority of the session and despite intervention from the PT, RN, his daughter on the phone, and case manager, the pt refused to get up. Pt is aware that he will not receive rehab services due to lack of participation. Will continue to follow acutely.   Corinne Ports E. Kaushik Maul, COTA/L Acute Rehabilitation Services [place] 05/04/2020, 2:52 PM

## 2020-05-04 NOTE — Plan of Care (Signed)
  Problem: Clinical Measurements: Goal: Ability to maintain clinical measurements within normal limits will improve Outcome: Progressing   Problem: Clinical Measurements: Goal: Will remain free from infection Outcome: Progressing   Problem: Health Behavior/Discharge Planning: Goal: Ability to manage health-related needs will improve Outcome: Progressing

## 2020-05-04 NOTE — TOC Progression Note (Signed)
Transition of Care [facility]) - Progression Note    Patient Details  Name: Richard [name] MRN: [id number] Date of Birth: [birth year]  Transition of Care [facility]) CM/SW [place], [place] Phone Number: 05/04/2020, 3:36 PM  Clinical Narrative:   CSW asked PT/OT earlier today to see patient after lunch, since he has been using the excuse of not eating to avoid therapy. CSW then met with patient with PT and OT at bedside, as patient was refusing to participate. CSW reminded him again that if he didn't participate then his insurance wouldn't cover it and he wouldn't get rehab. Patient agitated, saying he didn't want to and no one could make him. CSW got patient's daughter, Kathlee Nations, on the phone to talk to him, and patient said she couldn't make him do anything, either. Patient agreeable to LTC with his Medicaid, chose [facility]. Patient's HD will need to be moved. CSW notified covering Renal Navigator of patient's choice, and need to be switched to [place]. CSW to continue to follow.    Expected Discharge Plan: [place] Barriers to Discharge: Continued Medical Work up, Waiting for outpatient dialysis, Transportation, Ship broker  Expected Discharge Plan and Services Expected Discharge Plan: [place] Choice: [place] arrangements for the past 2 months: Single Family Home                                       Social Determinants of Health (SDOH) Interventions    Readmission Risk Interventions Readmission Risk Prevention Plan 02/15/2020  Transportation Screening Complete  PCP or Specialist Appt within 5-7 Days Not Complete  Not Complete comments pending disposition  Home Care Screening Complete  Medication Review (RN CM) Referral to Pharmacy  Some recent data might be hidden

## 2020-05-04 NOTE — Progress Notes (Addendum)
PROGRESS NOTE    Richard [name]  ZOX:[id number] DOB: 1956-02-19 DOA: 04/10/2020 PCP: [name], MD    Brief Narrative:  Patient admitted to the hospital with metabolic encephalopathy due to seizures, hypertensive emergency, acute stroke and uremia.  64 year old male with prolonged hospitalization, he does have the significant past medical history for type 2 diabetes mellitus, osteoarthritis, hypertension, chronic kidney disease, history of CVA with residual right-sided weakness who presented with seizures. Apparently noncompliant with his seizure medications, he was witnessed to have a generalized seizures for about 4 minutes. In the emergency department he was postictal, slowly recovered his mentation. Blood pressure 216/115, heart rate 89, temperature 98.3, respiratory rate 21, oxygen saturation 98%, his lungs were clear to auscultation bilaterally, heart S1-S2, present rhythmic, soft abdomen, no lower extremity edema, his mentation slowly improved, and by the time of his amdmission he was alert, oriented x3, and nonfocal.  Sodium 143, potassium 4.2, chloride 115, bicarb 13, glucose 106, BUN 76, creatinine 9.98, 0.6, hemoglobin 10.3, hematocrit 31.5, platelets 192. Head CT with no acute intracranial process.  Patient was admitted and loaded with Keppra with good response. Further work-up with brain MRI showed 2 mm acute to early subacute infarct within the left parietal lobe. Lumbar puncture was negative for infection.  Patient had recurrent encephalopathy and altered mental status that was slow to  recover. He did required tube feedings per NG tube during his hospitalization.   His seizures have been controlled with Keppra and Vimpat, follow EEG no active seizures, now has been changed to monotherapy with Keppra.  Patient continued to have worsening kidney function, and right intrajugular vein catheter was placed and patient underwent hemodialysis.On 08/04 he  had a left radiocephalic AV fistula creation.  His HIV was positive and patientwasstarted treatment with anti-retroviral therapy.   Assessment & Plan:   Principal Problem:   Encephalopathy Active Problems:   Tobacco abuse   Malnutrition of moderate degree (HCC)   Hyperlipidemia   Neuropathy in diabetes (HCC)   GERD (gastroesophageal reflux disease)   Diabetes mellitus, type II, insulin dependent (HCC)   History of cerebrovascular accident (CVA) with residual deficit   Seizure ([name])   AIDS (acquired immune deficiency syndrome) ([name])   ESRD (end stage renal disease) ([place])   Goals of care, counseling/discussion   Hypertensive urgency   Palliative care by specialist   Encounter for feeding tube placement   1. Acute metabolic encephalopathy. Clinically patient has improved and his encephalopathy has resolved.   Continue to be very weak and deconditioned. Physical therapy will assess patient today. Pending evaluation in order to be arrange SNF. Patient was able to tolerate chair HD yesterday.   2. Acute (2 mm acute to early subacute infarct within the left parietal lobe) on chronic CVA/ chronic right sided weakness and right upper extremity contracture.  Patient with acute on chronic right sided weakness and ambulatory dysfunction.   Continue with dual antiplatelet therapy with asa and clopidogrel. Tolerating well  pravastatin   Continue care at [facility].  3. Recurrent seizures. Patient with no clinical signs of further seizures. Continue with Keppra 500 mg bid.   Continue with trazodone qhs.   4. ESRD on HD. Now patient receiving renal replacement therapy with HD. Has a right IJ tunneled HD catheter in placed and a left upper extremity AV fistula has been created.  Tolerated chair HD well yesterday.   5. HTN/ hypertensive emergency. Continue with amlodipine and carvedilol for blood pressure control.    6.  Controlled T2DM Hgb A1c 5,2/ dyslipidemia. Capillary glucose  this am 212. Continue insulin regimen with basal 5 units glargine and insulin sliding scale.   Continue with pravastatin 80 mg daily.   7. HIV. CD 39, viral load 624,000. Continue antiretroviral therapy. PCP prophylaxis with bactrim.   8. Psoriasis dermatitis.Sp skin biopsy 07/30. Continue local skin care.   9. Moderate calorie protein malnutrition and anemia of chronic renal disease/ iron deficiency. Continue with darbepoetin plus iron supplementation.     Status is: Inpatient  Remains inpatient appropriate because:Unsafe d/c plan   Dispo: The patient is from: Home              Anticipated d/c is to: SNF              Anticipated d/c date is: 1 day              Patient currently is medically stable to d/c.   DVT prophylaxis: Heparin   Code Status:   full  Family Communication:  No family at the bedside      Nutrition Status: Nutrition Problem: Moderate Malnutrition Etiology: chronic illness (CKDV/ESRD starting HD) Signs/Symptoms: mild muscle depletion, mild fat depletion Interventions: Magic cup, Nepro shake     Consultants:   Neurology   Nephrology   ID  IR   Procedures:   Right IJ HD catheter  Left upper extremitiy AV fistula       Subjective: Patient continue to be very weak and deconditioned, right sided weakness, no nausea or vomiting.   Objective: Vitals:   05/03/20 1923 05/04/20 0500 05/04/20 0743 05/04/20 1205  BP: 120/61  137/74 (!) 144/43  Pulse: 100  89 93  Resp: 16  20 18   Temp: 98.3 F (36.8 C)  98.1 F (36.7 C) 98.3 F (36.8 C)  TempSrc: Oral  Oral Oral  SpO2: 100%  100% 98%  Weight:  70 kg    Height:        Intake/Output Summary (Last 24 hours) at 05/04/2020 1237 Last data filed at 05/04/2020 0500 Gross per 24 hour  Intake 390 ml  Output 1850 ml  Net -1460 ml   Filed Weights   05/03/20 1114 05/03/20 1516 05/04/20 0500  Weight: 71 kg 69.5 kg 70 kg    Examination:   General: Not in pain or dyspnea,  deconditioned  Neurology: Awake and alert, right sided weakness, right upper extremity contractured E ENT: mild pallor, no icterus, oral mucosa moist Cardiovascular: No JVD. S1-S2 present, rhythmic, no gallops, rubs, or murmurs. No lower extremity edema. Pulmonary: positive breath sounds bilaterally, adequate air movement, no wheezing, rhonchi or rales. Gastrointestinal. Abdomen soft and non tender Skin. No rashes Musculoskeletal: no joint deformities     Data Reviewed: I have personally reviewed following labs and imaging studies  CBC: Recent Labs  Lab 04/28/20 1422 05/01/20 0723 05/03/20 1728  WBC 6.8 6.1 3.9*  HGB 7.5* 7.2* 7.3*  HCT 23.7* 23.1* 24.0*  MCV 95.2 97.5 99.6  PLT 185 224 025   Basic Metabolic Panel: Recent Labs  Lab 04/28/20 1422 05/01/20 0723 05/03/20 1142  NA 137 139 141  K 4.4 4.1 4.0  CL 100 101 103  CO2 25 26 27   GLUCOSE 164* 146* 193*  BUN 42* 43* 26*  CREATININE 7.51* 6.66* 4.92*  CALCIUM 8.2* 8.3* 8.3*  PHOS 3.3 2.9 2.6   GFR: Estimated Creatinine Clearance: 15.4 mL/min (A) (by C-G formula based on SCr of 4.92 mg/dL (H)). Liver Function Tests: Recent  Labs  Lab 04/28/20 1422 05/01/20 0723 05/03/20 1142  ALBUMIN 2.3* 2.3* 2.5*   No results for input(s): LIPASE, AMYLASE in the last 168 hours. No results for input(s): AMMONIA in the last 168 hours. Coagulation Profile: No results for input(s): INR, PROTIME in the last 168 hours. Cardiac Enzymes: No results for input(s): CKTOTAL, CKMB, CKMBINDEX, TROPONINI in the last 168 hours. BNP (last 3 results) Recent Labs    06/22/19 1505  PROBNP 170.0*   HbA1C: No results for input(s): HGBA1C in the last 72 hours. CBG: Recent Labs  Lab 05/03/20 1927 05/03/20 2249 05/04/20 0654 05/04/20 0743 05/04/20 1207  GLUCAP 301* 316* 88 87 212*   Lipid Profile: No results for input(s): CHOL, HDL, LDLCALC, TRIG, CHOLHDL, LDLDIRECT in the last 72 hours. Thyroid Function Tests: No results for  input(s): TSH, T4TOTAL, FREET4, T3FREE, THYROIDAB in the last 72 hours. Anemia Panel: No results for input(s): VITAMINB12, FOLATE, FERRITIN, TIBC, IRON, RETICCTPCT in the last 72 hours.    Radiology Studies: I have reviewed all of the imaging during this hospital visit personally     Scheduled Meds: . amLODipine  10 mg Oral Daily  . bictegravir-emtricitabine-tenofovir AF  1 tablet Oral Daily  . carvedilol  3.125 mg Oral BID WC  . Chlorhexidine Gluconate Cloth  6 each Topical Q0600  . Chlorhexidine Gluconate Cloth  6 each Topical Q0600  . clopidogrel  75 mg Oral Daily  . darbepoetin (ARANESP) injection - DIALYSIS  150 mcg Intravenous Q Thu-HD  . feeding supplement (NEPRO CARB STEADY)  237 mL Oral TID BM  . heparin injection (subcutaneous)  5,000 Units Subcutaneous Q8H  . insulin aspart  0-5 Units Subcutaneous QHS  . insulin aspart  0-9 Units Subcutaneous TID WC  . insulin glargine  5 Units Subcutaneous QHS  . levETIRAcetam  500 mg Oral Q12H  . lidocaine-EPINEPHrine  20 mL Intradermal Once  . multivitamin  1 tablet Oral QHS  . pantoprazole  40 mg Oral Daily  . pravastatin  80 mg Oral QHS  . sulfamethoxazole-trimethoprim  1 tablet Oral Once per day on Mon Wed Fri  . tamsulosin  0.4 mg Oral Daily  . traZODone  50 mg Oral QHS   Continuous Infusions: . ferric gluconate (FERRLECIT/NULECIT) IV 125 mg (05/03/20 1204)     LOS: 24 days        Zain Lankford Gerome Apley, MD

## 2020-05-04 NOTE — Progress Notes (Signed)
Pt refusing  care dressing change to HD not done.

## 2020-05-04 NOTE — Progress Notes (Signed)
Pt is a challenge working with to day  Has refused CHG bath rational for bath explained and education given but pt still refused care

## 2020-05-04 NOTE — Discharge Planning (Signed)
Patient has outpatient dialysis seat arranged at [facility] on TTS at 12:30. Social worker to contact renal navigator if clinic location should be changed based final NH location.

## 2020-05-05 LAB — GLUCOSE, CAPILLARY
Glucose-Capillary: 105 mg/dL — ABNORMAL HIGH (ref 70–99)
Glucose-Capillary: 176 mg/dL — ABNORMAL HIGH (ref 70–99)
Glucose-Capillary: 132 mg/dL — ABNORMAL HIGH (ref 70–99)
Glucose-Capillary: 224 mg/dL — ABNORMAL HIGH (ref 70–99)

## 2020-05-05 NOTE — Progress Notes (Signed)
[facility] Progress Note   64 y.o.yo malewith HIV/SZ/advanced CKD as well as noncompliancewho was admitted on7/20/2021with sz but also found to progress to ESRD and has had dialysis initiated this hosp  Assessment/ Plan:   1. sz-Per primary team- Anti sz meds as he will consent 2. ESRD- new diagnosis this hospital admit. Have initiated HD- AVF placed 8/4. Has spot at [facility] on TTS - second shift, running here now TTS schedule- Had episode in HD on Thurs-Thatwas the second such episode;HD here went well on Saturday so feel better about his stability at OP center so feel like is OK to start planning for d/c to SNF  I have seen him onHDin a chair and  He was  comfortable appearing  Seen on HD this AM (uncomfortable laying supine in bed and asking to be rolled) 3K 902/40 -> 97'D systolic (asympt but turn off UF -> notified tech)  NextHD Tues  HE IS TOLERATING HD IN A CHAIR; SO WILL BE A CANDIDATE FOR OUTPT DIALYSIS. [place] 2ND SHIFT  3. Anemia-iron repletion in progress- Also ESA- inc dose for hgb in the 8's (on 150 qweek last dose given 8/12) 4. Secondary hyperparathyroidism- PTH 160- calc /phos WNL- on no meds -> 2.6 5. HTN/volume-better- Refusing meds at times- On reg of norvasc 10, coreg 3.125 (may inc), then hydralazine and labetalol PRN (has not needed) 6. HIV-Biktarvy 7. FTT- Albumin under 3 and will need SNF placement at discharge  8. Dispo- Difficult situation- Will need SNF at d/c- Palliative speaking with family that have been hesitant to make decisions re dispo and DNR- Seems will be followed by palliative at discharge  Subjective:   Denies f/c/n/v/dyspnea. C/O discomfort laying on his back during dialysis   Objective:   BP (!) 107/92 (BP Location: Left Leg)   Pulse 82   Temp 98 F (36.7 C) (Oral)   Resp 18   Ht 6' (1.829 m)   Wt 70 kg   SpO2 97%   BMI 20.93 kg/m   Intake/Output Summary (Last 24 hours) at  05/05/2020 1146 Last data filed at 05/05/2020 5329 Gross per 24 hour  Intake --  Output 350 ml  Net -350 ml   Weight change: -1 kg  Physical Exam: General:alert, talkative Heart:RRR Lungs: mostly clear Abdomen: soft, non tender Extremities: no edema Dialysis Access: right sided TDC new leftBCF- Good thrill and bruit  Imaging: No results found.  Labs: BMET Recent Labs  Lab 04/28/20 1422 05/01/20 0723 05/03/20 1142  NA 137 139 141  K 4.4 4.1 4.0  CL 100 101 103  CO2 25 26 27   GLUCOSE 164* 146* 193*  BUN 42* 43* 26*  CREATININE 7.51* 6.66* 4.92*  CALCIUM 8.2* 8.3* 8.3*  PHOS 3.3 2.9 2.6   CBC Recent Labs  Lab 04/28/20 1422 05/01/20 0723 05/03/20 1728  WBC 6.8 6.1 3.9*  HGB 7.5* 7.2* 7.3*  HCT 23.7* 23.1* 24.0*  MCV 95.2 97.5 99.6  PLT 185 224 177    Medications:    . amLODipine  10 mg Oral Daily  . bictegravir-emtricitabine-tenofovir AF  1 tablet Oral Daily  . carvedilol  3.125 mg Oral BID WC  . Chlorhexidine Gluconate Cloth  6 each Topical Q0600  . Chlorhexidine Gluconate Cloth  6 each Topical Q0600  . clopidogrel  75 mg Oral Daily  . darbepoetin (ARANESP) injection - DIALYSIS  150 mcg Intravenous Q Thu-HD  . feeding supplement (NEPRO CARB STEADY)  237 mL Oral TID BM  .  heparin injection (subcutaneous)  5,000 Units Subcutaneous Q8H  . insulin aspart  0-5 Units Subcutaneous QHS  . insulin aspart  0-9 Units Subcutaneous TID WC  . insulin glargine  5 Units Subcutaneous QHS  . levETIRAcetam  500 mg Oral Q12H  . lidocaine-EPINEPHrine  20 mL Intradermal Once  . multivitamin  1 tablet Oral QHS  . pantoprazole  40 mg Oral Daily  . pravastatin  80 mg Oral QHS  . sulfamethoxazole-trimethoprim  1 tablet Oral Once per day on Mon Wed Fri  . tamsulosin  0.4 mg Oral Daily  . traZODone  50 mg Oral QHS      Otelia Santee, MD 05/05/2020, 11:46 AM

## 2020-05-05 NOTE — Progress Notes (Signed)
Patient off unit for HD 

## 2020-05-05 NOTE — Progress Notes (Signed)
PROGRESS NOTE    Richard [name]  EXB:[id number] DOB: 1956-08-02 DOA: 04/10/2020 PCP: [name], MD    Brief Narrative:  Patient admitted to the hospital with metabolic encephalopathy due to seizures, hypertensive emergency, acute stroke and uremia.  64 year old male with prolonged hospitalization, he does have the significant past medical history for type 2 diabetes mellitus, osteoarthritis, hypertension, chronic kidney disease, history of CVA with residual right-sided weakness who presented with seizures. Apparently noncompliant with his seizure medications, he was witnessed to have a generalized seizures for about 4 minutes. In the emergency department he was postictal, slowly recovered his mentation. Blood pressure 216/115, heart rate 89, temperature 98.3, respiratory rate 21, oxygen saturation 98%, his lungs were clear to auscultation bilaterally, heart S1-S2, present rhythmic, soft abdomen, no lower extremity edema, his mentationslowlyimproved,and by the time of his amdmissionhe was alert, oriented x3,andnonfocal.  Sodium 143, potassium 4.2, chloride 115, bicarb 13, glucose 106, BUN 76, creatinine 9.98, 0.6, hemoglobin 10.3, hematocrit 31.5, platelets 192. Head CT with no acute intracranial process.  Patient was admitted and loaded with Keppra with good response. Further work-up with brain MRI showed 2 mm acute to early subacute infarct within the left parietal lobe. Lumbar puncture was negative for infection.  Patient had recurrent encephalopathy and altered mental status that was slow to recover. He did required tube feedings per NG tube during his hospitalization.   His seizures have been controlled with Keppra and Vimpat, follow EEG no active seizures, now has been changed to monotherapy with Keppra.  Patient continued to have worsening kidney function, and right intrajugular vein catheter was placed and patient underwent hemodialysis.On 08/04 he  had a left radiocephalic AV fistula creation.  His HIV was positive and patientwasstarted treatment with anti-retroviral therapy.  Unsafe discharge, patient pending placement.    Assessment & Plan:   Principal Problem:   Encephalopathy Active Problems:   Tobacco abuse   Malnutrition of moderate degree (HCC)   Hyperlipidemia   Neuropathy in diabetes (HCC)   GERD (gastroesophageal reflux disease)   Diabetes mellitus, type II, insulin dependent (HCC)   History of cerebrovascular accident (CVA) with residual deficit   Seizure ([place])   AIDS (acquired immune deficiency syndrome) ([place])   ESRD (end stage renal disease) ([place])   Goals of care, counseling/discussion   Hypertensive urgency   Palliative care by specialist   Encounter for feeding tube placement    1. Acute metabolic encephalopathy. Clinically patient has improved and his encephalopathy has resolved.   Resolved encephalopathy. Pending placement due to unsafe discharge. Continue trazodone at night for sleep.    2. Acute (2 mm acute to early subacute infarct within the left parietal lobe) on chronic CVA/ chronic right sided weakness and right upper extremity contracture.  Patient with acute on chronic right sided weakness and ambulatory dysfunction.  Tolerating well dual antiplatelet therapy with asa and clopidogrel. Continue pravastatin.  3. Recurrent seizures. On Keppra 500 mg bid.    4. ESRD on HD. Now patient receiving renal replacement therapy with HD. Has a right IJ tunneled HD catheter in placed and a left upper extremity AV fistula has been created.  HD today with good toleration.   5. HTN/ hypertensive emergency.Blood pressure control with amlodipine and carvedilol   6. Controlled T2DM Hgb A1c 5,2/ dyslipidemia. Capillary glucose this am 105. On insulin therapy: basal 5 units glargine daily and insulin sliding scale.   On pravastatin 80 mg daily.   7. HIV. CD 39, viral load 624,000.  Continue antiretroviral therapy. PCP prophylaxis with bactrim.  8. Psoriasis dermatitis.Sp skin biopsy 07/30. Continue local skin care.   9. Moderate calorie protein malnutrition and anemia of chronic renal disease/ iron deficiency.Continue withdarbepoetin plus iron supplementation.    Status is: Inpatient  Remains inpatient appropriate because:Unsafe d/c plan   Dispo: The patient is from: Home              Anticipated d/c is to: SNF              Anticipated d/c date is: 1 day              Patient currently is medically stable to d/c.   DVT prophylaxis: Heparin   Code Status:   full  Family Communication:  No family at the bedside      Nutrition Status: Nutrition Problem: Moderate Malnutrition Etiology: chronic illness (CKDV/ESRD starting HD) Signs/Symptoms: mild muscle depletion, mild fat depletion Interventions: Magic cup, Nepro shake     Consultants:   Neurology   Nephrology   ID  IR   Procedures:   Right IJ HD catheter  Left upper extremitiy AV fistula     Subjective: Patient continue to have insomnia, no nausea or vomiting, no chest pain or dyspnea. Tolerating po well. Had HD today with no complications.   Objective: Vitals:   05/04/20 1945 05/04/20 2314 05/05/20 0331 05/05/20 0754  BP: (!) 155/54 (!) 152/65 (!) 141/73 (!) 107/92  Pulse: 93 90 95 82  Resp: 18 16 16 18   Temp: 98.2 F (36.8 C) 98 F (36.7 C) 98.3 F (36.8 C) 98 F (36.7 C)  TempSrc: Oral Oral Oral Oral  SpO2: 97% 98% 98% 97%  Weight:   70 kg   Height:        Intake/Output Summary (Last 24 hours) at 05/05/2020 0958 Last data filed at 05/05/2020 0332 Gross per 24 hour  Intake --  Output 350 ml  Net -350 ml   Filed Weights   05/03/20 1516 05/04/20 0500 05/05/20 0331  Weight: 69.5 kg 70 kg 70 kg    Examination:   General: Not in pain or dyspnea. Deconditioned  Neurology: Awake and alert, right upper extremity weakness E ENT: no pallor, no icterus, oral  mucosa moist Cardiovascular: No JVD. S1-S2 present, rhythmic, no gallops, rubs, or murmurs. No lower extremity edema. Pulmonary: positive breath sounds bilaterally,  Gastrointestinal. Abdomen soft and non tender Skin. No rashes Musculoskeletal: no joint deformities     Data Reviewed: I have personally reviewed following labs and imaging studies  CBC: Recent Labs  Lab 04/28/20 1422 05/01/20 0723 05/03/20 1728  WBC 6.8 6.1 3.9*  HGB 7.5* 7.2* 7.3*  HCT 23.7* 23.1* 24.0*  MCV 95.2 97.5 99.6  PLT 185 224 798   Basic Metabolic Panel: Recent Labs  Lab 04/28/20 1422 05/01/20 0723 05/03/20 1142  NA 137 139 141  K 4.4 4.1 4.0  CL 100 101 103  CO2 25 26 27   GLUCOSE 164* 146* 193*  BUN 42* 43* 26*  CREATININE 7.51* 6.66* 4.92*  CALCIUM 8.2* 8.3* 8.3*  PHOS 3.3 2.9 2.6   GFR: Estimated Creatinine Clearance: 15.4 mL/min (A) (by C-G formula based on SCr of 4.92 mg/dL (H)). Liver Function Tests: Recent Labs  Lab 04/28/20 1422 05/01/20 0723 05/03/20 1142  ALBUMIN 2.3* 2.3* 2.5*   No results for input(s): LIPASE, AMYLASE in the last 168 hours. No results for input(s): AMMONIA in the last 168 hours. Coagulation Profile: No results for input(s): INR,  PROTIME in the last 168 hours. Cardiac Enzymes: No results for input(s): CKTOTAL, CKMB, CKMBINDEX, TROPONINI in the last 168 hours. BNP (last 3 results) Recent Labs    06/22/19 1505  PROBNP 170.0*   HbA1C: No results for input(s): HGBA1C in the last 72 hours. CBG: Recent Labs  Lab 05/04/20 0743 05/04/20 1207 05/04/20 1548 05/04/20 2107 05/05/20 0602  GLUCAP 87 212* 186* 179* 105*   Lipid Profile: No results for input(s): CHOL, HDL, LDLCALC, TRIG, CHOLHDL, LDLDIRECT in the last 72 hours. Thyroid Function Tests: No results for input(s): TSH, T4TOTAL, FREET4, T3FREE, THYROIDAB in the last 72 hours. Anemia Panel: No results for input(s): VITAMINB12, FOLATE, FERRITIN, TIBC, IRON, RETICCTPCT in the last 72  hours.    Radiology Studies: I have reviewed all of the imaging during this hospital visit personally     Scheduled Meds:  amLODipine  10 mg Oral Daily   bictegravir-emtricitabine-tenofovir AF  1 tablet Oral Daily   carvedilol  3.125 mg Oral BID WC   Chlorhexidine Gluconate Cloth  6 each Topical Q0600   Chlorhexidine Gluconate Cloth  6 each Topical Q0600   clopidogrel  75 mg Oral Daily   darbepoetin (ARANESP) injection - DIALYSIS  150 mcg Intravenous Q Thu-HD   feeding supplement (NEPRO CARB STEADY)  237 mL Oral TID BM   heparin injection (subcutaneous)  5,000 Units Subcutaneous Q8H   insulin aspart  0-5 Units Subcutaneous QHS   insulin aspart  0-9 Units Subcutaneous TID WC   insulin glargine  5 Units Subcutaneous QHS   levETIRAcetam  500 mg Oral Q12H   lidocaine-EPINEPHrine  20 mL Intradermal Once   multivitamin  1 tablet Oral QHS   pantoprazole  40 mg Oral Daily   pravastatin  80 mg Oral QHS   sulfamethoxazole-trimethoprim  1 tablet Oral Once per day on Mon Wed Fri   tamsulosin  0.4 mg Oral Daily   traZODone  50 mg Oral QHS   Continuous Infusions:  ferric gluconate (FERRLECIT/NULECIT) IV 125 mg (05/03/20 1204)   ferric gluconate (FERRLECIT/NULECIT) IV       LOS: 25 days        Huckleberry Martinson Gerome Apley, MD

## 2020-05-05 NOTE — Progress Notes (Signed)
Patient back in room from HD

## 2020-05-06 LAB — GLUCOSE, CAPILLARY
Glucose-Capillary: 89 mg/dL (ref 70–99)
Glucose-Capillary: 145 mg/dL — ABNORMAL HIGH (ref 70–99)
Glucose-Capillary: 178 mg/dL — ABNORMAL HIGH (ref 70–99)
Glucose-Capillary: 196 mg/dL — ABNORMAL HIGH (ref 70–99)

## 2020-05-06 MED ORDER — MELATONIN 3 MG PO TABS
3.0000 mg | ORAL_TABLET | Freq: Every day | ORAL | Status: DC
  Administered 2020-05-06 – 2020-05-07 (×2): 3 mg via ORAL
  Filled 2020-05-06 (×2): qty 1

## 2020-05-06 NOTE — Progress Notes (Signed)
Patient refused 4pm vitals, wanted to sleep.

## 2020-05-06 NOTE — Progress Notes (Signed)
[facility] Progress Note   64 y.o.yo malewith HIV/SZ/advanced CKD as well as noncompliancewho was admitted on7/20/2021with sz but also found to progress to ESRD and has had dialysis initiated this hosp  Assessment/ Plan:   1. sz-Per primary team- Anti sz meds as he will consent 2. ESRD- new diagnosis this hospital admit. Have initiated HD- AVF placed 8/4. Has spot at [facility] on TTS - second shift, running here now TTS schedule- Had episode in HD on Thurs-Thatwas the second such episode;HD here went well subsequently.  I have seen him onHDin a chair and  He was  comfortable appearing  Last HD 8/14 (UF net 1250 ml) and no issues.   NextHD Tues  HE IS TOLERATING HD IN A CHAIR; SO WILL BE A CANDIDATE FOR OUTPT DIALYSIS. [place] 2ND SHIFT  3. Anemia-iron repletion in progress- Also ESA- inc dose for hgb in the 8's (on 150 qweek last dose given 8/12) 4. Secondary hyperparathyroidism- PTH 160- calc /phos WNL- on no meds -> 2.6 5. HTN/volume-better- Refusing meds at times- On reg of norvasc 10, coreg 3.125 (may inc), then hydralazine and labetalol PRN (has not needed) 6. HIV-Biktarvy 7. FTT- Albumin under 3 and will need SNF placement at discharge  8. Dispo- Difficult situation- Will need SNF at d/c- Palliative speaking with family that have been hesitant to make decisions re dispo and DNR- Seems will be followed by palliative at discharge  Subjective:   Denies f/c/n/v/dyspnea. C/O discomfort laying on his back for too long   Objective:   BP 112/78 (BP Location: Right Arm)   Pulse 80   Temp 98.2 F (36.8 C) (Oral)   Resp 18   Ht 6' (1.829 m)   Wt 69.8 kg   SpO2 98%   BMI 20.87 kg/m   Intake/Output Summary (Last 24 hours) at 05/06/2020 1208 Last data filed at 05/05/2020 2350 Gross per 24 hour  Intake --  Output 1450 ml  Net -1450 ml   Weight change: 0 kg  Physical Exam: General:alert, talkative Heart:RRR Lungs:  mostly clear Abdomen: soft, non tender Extremities: no edema Dialysis Access: right sided TDC new leftBCF- Good thrill and bruit  Imaging: No results found.  Labs: BMET Recent Labs  Lab 05/01/20 0723 05/03/20 1142  NA 139 141  K 4.1 4.0  CL 101 103  CO2 26 27  GLUCOSE 146* 193*  BUN 43* 26*  CREATININE 6.66* 4.92*  CALCIUM 8.3* 8.3*  PHOS 2.9 2.6   CBC Recent Labs  Lab 05/01/20 0723 05/03/20 1728  WBC 6.1 3.9*  HGB 7.2* 7.3*  HCT 23.1* 24.0*  MCV 97.5 99.6  PLT 224 177    Medications:    . amLODipine  10 mg Oral Daily  . bictegravir-emtricitabine-tenofovir AF  1 tablet Oral Daily  . carvedilol  3.125 mg Oral BID WC  . Chlorhexidine Gluconate Cloth  6 each Topical Q0600  . Chlorhexidine Gluconate Cloth  6 each Topical Q0600  . clopidogrel  75 mg Oral Daily  . darbepoetin (ARANESP) injection - DIALYSIS  150 mcg Intravenous Q Thu-HD  . feeding supplement (NEPRO CARB STEADY)  237 mL Oral TID BM  . heparin injection (subcutaneous)  5,000 Units Subcutaneous Q8H  . insulin aspart  0-5 Units Subcutaneous QHS  . insulin aspart  0-9 Units Subcutaneous TID WC  . insulin glargine  5 Units Subcutaneous QHS  . levETIRAcetam  500 mg Oral Q12H  . lidocaine-EPINEPHrine  20 mL Intradermal Once  . melatonin  3 mg Oral QHS  . multivitamin  1 tablet Oral QHS  . pantoprazole  40 mg Oral Daily  . pravastatin  80 mg Oral QHS  . sulfamethoxazole-trimethoprim  1 tablet Oral Once per day on Mon Wed Fri  . tamsulosin  0.4 mg Oral Daily      Otelia Santee, MD 05/06/2020, 12:08 PM

## 2020-05-06 NOTE — Progress Notes (Signed)
PROGRESS NOTE    Richard [name]  BSJ:[id number] DOB: 1955/10/19 DOA: 04/10/2020 PCP: [name], MD    Brief Narrative:  Patient admitted to the hospital with the working diagnosis of metabolic encephalopathy due to seizures, hypertensive emergency, acute stroke and uremia. Now on hemodialysis and pending placement at SNF.   64 year old male with prolonged hospitalization, he does have the significant past medical history for type 2 diabetes mellitus, osteoarthritis, hypertension, chronic kidney disease, history of CVA with residual right-sided weakness who presented with seizures. Apparently noncompliant with his seizure medications, he was witnessed to have a generalized seizures for about 4 minutes. In the emergency department he was postictal, and slowly recovered his mentation. On his initial physical examination his blood pressure was 216/115, heart rate 89, temperature 98.3, respiratory rate 21, oxygen saturation 98%, his lungs were clear to auscultation bilaterally, heart S1-S2, present rhythmic, soft abdomen, no lower extremity edema, his mentationslowlyimproved,and by the time of his amdmissionhe was alert, oriented x3,andnonfocal.  Sodium 143, potassium 4.2, chloride 115, bicarb 13, glucose 106, BUN 76, creatinine 9.98,  hemoglobin 10.3, hematocrit 31.5, platelets 192. Head CT with no acute intracranial process.  Patient was admitted and loaded with Keppra with good response. Further work-up with brain MRI showed 2 mm acute to early subacute infarct within the left parietal lobe. Lumbar puncture was negative for infection.  Patient had recurrent encephalopathy and altered mental status that was slow to recover. He did required tube feedings per NG tube during his hospitalization.   His seizures have been controlled with Keppra and Vimpat, follow EEG no active seizures, now has been changed to monotherapy with Keppra.  Patient continued to have  worsening kidney function, and right intrajugular vein HD  catheter was placed and patient underwent hemodialysis.On 08/04 he had a left radiocephalic AV fistula creation.  His HIV test was positive and patientwasstarted treatment with anti-retroviral therapy.  Currently he is a unsafe discharge, patient pending placement.    Assessment & Plan:   Principal Problem:   Encephalopathy Active Problems:   Tobacco abuse   Malnutrition of moderate degree (HCC)   Hyperlipidemia   Neuropathy in diabetes (HCC)   GERD (gastroesophageal reflux disease)   Diabetes mellitus, type II, insulin dependent (HCC)   History of cerebrovascular accident (CVA) with residual deficit   Seizure ([place])   AIDS (acquired immune deficiency syndrome) ([place])   ESRD (end stage renal disease) ([place])   Goals of care, counseling/discussion   Hypertensive urgency   Palliative care by specialist   Encounter for feeding tube placement   1. Acute metabolic encephalopathy. Clinically patient has improved and his encephalopathy has resolved.   No confusion or agitation, continue to be very debilitated, pending placement. Continue to have insomnia despite trazodone, will discontinue this medication and will add melatonin.   2. Acute (2 mm acute to early subacute infarct within the left parietal lobe) on chronic CVA/ chronic right sided weakness and right upper extremity contracture.  Patient withacute onchronic right sided weakness and ambulatory dysfunction.  On dual antiplatelet therapy with asa and clopidogrel. On pravastatin.  3. Recurrent seizures. Continue with Keppra 500 mg bid.No clinical signs of active seizures.    4. ESRD on HD. Now patient receiving renal replacement therapy with HD. Has a right IJ tunneled HD catheter in placed and a left upper extremity AV fistula has been created.  Continue HD per nephrology recommendations.    5. HTN/ hypertensive emergency.Continue withamlodipine  and carvedilol  6. Controlled T2DM  Hgb A1c 5,2/ dyslipidemia. Continue with basal 5 units glargine daily plus insulin sliding scale, for glucose cover and monitoring  Continue  with  pravastatin 80 mg daily.   7. HIV. CD 39, viral load 624,000. ON antiretroviral therapy. PCP prophylaxis with bactrim. Will need outpatient follow up.   8. Psoriasis dermatitis.Sp skin biopsy 07/30. Continue local skin care.   9. Moderate calorie protein malnutrition and anemia of chronic renal disease/ iron deficiency.Continue withdarbepoetin plus iron supplementation     Status is: Inpatient  Remains inpatient appropriate because:Unsafe d/c plan   Dispo: The patient is from: Home              Anticipated d/c is to: SNF              Anticipated d/c date is: 1 day              Patient currently is medically stable to d/c.    DVT prophylaxis: Heparin   Code Status:   full  Family Communication:  No family at the bedside      Nutrition Status: Nutrition Problem: Moderate Malnutrition Etiology: chronic illness (CKDV/ESRD starting HD) Signs/Symptoms: mild muscle depletion, mild fat depletion Interventions: Magic cup, Nepro shake     Consultants:  Neurology   Nephrology   ID  IR  Procedures:  Right IJ HD catheter  Left upper extremitiy AV fistula     Subjective: Patient continue to have difficulty sleeping, no nausea or vomiting, no chest pain or dyspnea, continue to be very weak and deconditioned,.   Objective: Vitals:   05/05/20 1927 05/05/20 2350 05/06/20 0331 05/06/20 0800  BP: (!) 149/75 133/74 130/77 112/78  Pulse: 91 89 81 80  Resp: 18 18 18 18   Temp: 98 F (36.7 C) 98.3 F (36.8 C) 98 F (36.7 C) 98.2 F (36.8 C)  TempSrc: Oral Oral Oral Oral  SpO2: 96% 95% 96% 98%  Weight:   69.8 kg   Height:        Intake/Output Summary (Last 24 hours) at 05/06/2020 0859 Last data filed at 05/05/2020 2350 Gross per 24 hour  Intake --  Output 1450  ml  Net -1450 ml   Filed Weights   05/05/20 0331 05/05/20 0914 05/06/20 0331  Weight: 70 kg 70 kg 69.8 kg    Examination:   General:deconditioned  Neurology: Awake and alert, right upper extremity contracture E ENT: no pallor, no icterus, oral mucosa moist Cardiovascular: No JVD. S1-S2 present, rhythmic, no gallops, rubs, or murmurs. No lower extremity edema. Pulmonary: positive breath sounds bilaterally,  Gastrointestinal. Abdomen soft and non tender Skin. No rashes Musculoskeletal: no joint deformities     Data Reviewed: I have personally reviewed following labs and imaging studies  CBC: Recent Labs  Lab 05/01/20 0723 05/03/20 1728  WBC 6.1 3.9*  HGB 7.2* 7.3*  HCT 23.1* 24.0*  MCV 97.5 99.6  PLT 224 427   Basic Metabolic Panel: Recent Labs  Lab 05/01/20 0723 05/03/20 1142  NA 139 141  K 4.1 4.0  CL 101 103  CO2 26 27  GLUCOSE 146* 193*  BUN 43* 26*  CREATININE 6.66* 4.92*  CALCIUM 8.3* 8.3*  PHOS 2.9 2.6   GFR: Estimated Creatinine Clearance: 15.4 mL/min (A) (by C-G formula based on SCr of 4.92 mg/dL (H)). Liver Function Tests: Recent Labs  Lab 05/01/20 0723 05/03/20 1142  ALBUMIN 2.3* 2.5*   No results for input(s): LIPASE, AMYLASE in the last 168 hours. No results for input(s): AMMONIA  in the last 168 hours. Coagulation Profile: No results for input(s): INR, PROTIME in the last 168 hours. Cardiac Enzymes: No results for input(s): CKTOTAL, CKMB, CKMBINDEX, TROPONINI in the last 168 hours. BNP (last 3 results) Recent Labs    06/22/19 1505  PROBNP 170.0*   HbA1C: No results for input(s): HGBA1C in the last 72 hours. CBG: Recent Labs  Lab 05/05/20 0602 05/05/20 1408 05/05/20 1549 05/05/20 2102 05/06/20 0603  GLUCAP 105* 176* 132* 224* 89   Lipid Profile: No results for input(s): CHOL, HDL, LDLCALC, TRIG, CHOLHDL, LDLDIRECT in the last 72 hours. Thyroid Function Tests: No results for input(s): TSH, T4TOTAL, FREET4, T3FREE,  THYROIDAB in the last 72 hours. Anemia Panel: No results for input(s): VITAMINB12, FOLATE, FERRITIN, TIBC, IRON, RETICCTPCT in the last 72 hours.    Radiology Studies: I have reviewed all of the imaging during this hospital visit personally     Scheduled Meds: . amLODipine  10 mg Oral Daily  . bictegravir-emtricitabine-tenofovir AF  1 tablet Oral Daily  . carvedilol  3.125 mg Oral BID WC  . Chlorhexidine Gluconate Cloth  6 each Topical Q0600  . Chlorhexidine Gluconate Cloth  6 each Topical Q0600  . clopidogrel  75 mg Oral Daily  . darbepoetin (ARANESP) injection - DIALYSIS  150 mcg Intravenous Q Thu-HD  . feeding supplement (NEPRO CARB STEADY)  237 mL Oral TID BM  . heparin injection (subcutaneous)  5,000 Units Subcutaneous Q8H  . insulin aspart  0-5 Units Subcutaneous QHS  . insulin aspart  0-9 Units Subcutaneous TID WC  . insulin glargine  5 Units Subcutaneous QHS  . levETIRAcetam  500 mg Oral Q12H  . lidocaine-EPINEPHrine  20 mL Intradermal Once  . multivitamin  1 tablet Oral QHS  . pantoprazole  40 mg Oral Daily  . pravastatin  80 mg Oral QHS  . sulfamethoxazole-trimethoprim  1 tablet Oral Once per day on Mon Wed Fri  . tamsulosin  0.4 mg Oral Daily  . traZODone  50 mg Oral QHS   Continuous Infusions: . ferric gluconate (FERRLECIT/NULECIT) IV 125 mg (05/03/20 1204)  . ferric gluconate (FERRLECIT/NULECIT) IV 125 mg (05/05/20 1434)     LOS: 26 days        Hally Colella Gerome Apley, MD

## 2020-05-07 DIAGNOSIS — G464 Cerebellar stroke syndrome: Secondary | ICD-10-CM | POA: Insufficient documentation

## 2020-05-07 LAB — GLUCOSE, CAPILLARY
Glucose-Capillary: 57 mg/dL — ABNORMAL LOW (ref 70–99)
Glucose-Capillary: 185 mg/dL — ABNORMAL HIGH (ref 70–99)
Glucose-Capillary: 153 mg/dL — ABNORMAL HIGH (ref 70–99)
Glucose-Capillary: 202 mg/dL — ABNORMAL HIGH (ref 70–99)

## 2020-05-07 LAB — SARS CORONAVIRUS 2 BY RT PCR (HOSPITAL ORDER, PERFORMED IN ~~LOC~~ HOSPITAL LAB): SARS Coronavirus 2: NEGATIVE

## 2020-05-07 MED ORDER — INSULIN GLARGINE 100 UNIT/ML ~~LOC~~ SOLN: 5.0000 [IU] | Freq: Every day | SUBCUTANEOUS | 0 refills | Status: DC

## 2020-05-07 MED ORDER — NEPRO/CARBSTEADY PO LIQD: 237.0000 mL | Freq: Three times a day (TID) | ORAL | 0 refills | Status: AC

## 2020-05-07 MED ORDER — RENA-VITE PO TABS: 1.0000 | ORAL_TABLET | Freq: Every day | ORAL | 0 refills | Status: AC

## 2020-05-07 MED ORDER — INSULIN ASPART 100 UNIT/ML ~~LOC~~ SOLN: 0.0000 [IU] | Freq: Three times a day (TID) | SUBCUTANEOUS | 0 refills | Status: DC

## 2020-05-07 MED ORDER — CARVEDILOL 3.125 MG PO TABS: 3.1250 mg | ORAL_TABLET | Freq: Two times a day (BID) | ORAL | 0 refills | Status: DC

## 2020-05-07 MED ORDER — MELATONIN 3 MG PO TABS: 3.0000 mg | ORAL_TABLET | Freq: Every day | ORAL | 0 refills | Status: AC

## 2020-05-07 NOTE — Progress Notes (Signed)
[facility] Progress Note   64 y.o.yo malewith HIV/SZ/advanced CKD as well as noncompliancewho was admitted on7/20/2021with sz but also found to progress to ESRD and has had dialysis initiated this hosp  Assessment/ Plan:   1. sz-Per primary team- Anti sz meds as he will consent 2. ESRD- new diagnosis this hospital admit. Have initiated HD- AVF placed 8/4. Has spot at [facility] on TTS - second shift, running here now TTS schedule-has undergone several sessions of dialysis and tolerated well.  Plan to continue dialysis outpatient 3. Anemia-continue iron repletion and Aranesp 150 mcg weekly. 4. Secondary hyperparathyroidism- PTH 160- calc /phos WNL- on no meds  5. HTN/volume-better- Refusing meds at times- On reg of norvasc 10, coreg 3.125 (may inc), then hydralazine and labetalol PRN (has not needed) 6. HIV-Biktarvy 7. FTT-SNF placement at discharge.  Encourage nutrition 8. Dispo- Difficult situation- Will need SNF at d/c- Palliative speaking with family that have been hesitant to make decisions re dispo and DNR- Seems will be followed by palliative at discharge  Subjective:   Patient without significant complaints today.  Likely planning to go home.   Objective:   BP (!) 154/77 (BP Location: Left Arm)   Pulse 88   Temp 98.4 F (36.9 C) (Oral)   Resp 18   Ht 6' (1.829 m)   Wt 69.8 kg   SpO2 100%   BMI 20.87 kg/m   Intake/Output Summary (Last 24 hours) at 05/07/2020 1257 Last data filed at 05/07/2020 0801 Gross per 24 hour  Intake 240 ml  Output 450 ml  Net -210 ml   Weight change:   Physical Exam: General:alert, no distress Heart:RRR Lungs: No increased work of breathing, bilateral chest rise Abdomen: soft, non tender Extremities: no edema Dialysis Access: right sided TDC new leftBCF- Good thrill and bruit  Imaging: No results found.  Labs: BMET Recent Labs  Lab 05/01/20 0723 05/03/20 1142  NA 139 141  K 4.1 4.0   CL 101 103  CO2 26 27  GLUCOSE 146* 193*  BUN 43* 26*  CREATININE 6.66* 4.92*  CALCIUM 8.3* 8.3*  PHOS 2.9 2.6   CBC Recent Labs  Lab 05/01/20 0723 05/03/20 1728  WBC 6.1 3.9*  HGB 7.2* 7.3*  HCT 23.1* 24.0*  MCV 97.5 99.6  PLT 224 177    Medications:    . amLODipine  10 mg Oral Daily  . bictegravir-emtricitabine-tenofovir AF  1 tablet Oral Daily  . carvedilol  3.125 mg Oral BID WC  . Chlorhexidine Gluconate Cloth  6 each Topical Q0600  . Chlorhexidine Gluconate Cloth  6 each Topical Q0600  . clopidogrel  75 mg Oral Daily  . darbepoetin (ARANESP) injection - DIALYSIS  150 mcg Intravenous Q Thu-HD  . feeding supplement (NEPRO CARB STEADY)  237 mL Oral TID BM  . heparin injection (subcutaneous)  5,000 Units Subcutaneous Q8H  . insulin aspart  0-5 Units Subcutaneous QHS  . insulin aspart  0-9 Units Subcutaneous TID WC  . insulin glargine  5 Units Subcutaneous QHS  . levETIRAcetam  500 mg Oral Q12H  . lidocaine-EPINEPHrine  20 mL Intradermal Once  . melatonin  3 mg Oral QHS  . multivitamin  1 tablet Oral QHS  . pantoprazole  40 mg Oral Daily  . pravastatin  80 mg Oral QHS  . sulfamethoxazole-trimethoprim  1 tablet Oral Once per day on Mon Wed Fri  . tamsulosin  0.4 mg Oral Daily     Reesa Chew  05/07/2020, 12:57  PM

## 2020-05-07 NOTE — TOC Transition Note (Signed)
[facility] - CM/SW Discharge Note   Patient Details  Name: Richard [name] MRN: [id number] Date of Birth: 10-17-1955  [facility]) CM/SW Contact:  Geralynn Ochs, LCSW Phone Number: 05/07/2020, 4:05 PM   Clinical Narrative:   Nurse to call report to [phone].    Final next level of care: Skilled Nursing Facility Barriers to Discharge: Barriers Resolved   Patient Goals and CMS Choice Patient states their goals for this hospitalization and ongoing recovery are:: patient unable to participate in goal setting at this time due to disorientation CMS Medicare.gov Compare Post Acute Care list provided to:: Patient Represenative (must comment) Choice offered to / list presented to : Adult Children  Discharge Placement              Patient chooses bed at:  [facility]) Patient to be transferred to facility by: [place] Name of family member notified: Self, Kathlee Nations Patient and family notified of of transfer: 05/07/20  Discharge Plan and Services     Post Acute Care Choice: [place]                               Social Determinants of Health (SDOH) Interventions     Readmission Risk Interventions Readmission Risk Prevention Plan 02/15/2020  Transportation Screening Complete  PCP or Specialist Appt within 5-7 Days Not Complete  Not Complete comments pending disposition  Home Care Screening Complete  Medication Review (RN CM) Referral to Pharmacy  Some recent data might be hidden

## 2020-05-07 NOTE — Plan of Care (Signed)
Adequate for discharge.

## 2020-05-07 NOTE — Progress Notes (Addendum)
Renal Navigator received call from CSW/L/ Richard [name] stating patient has chosen long term care facility in [place]. Navigator has requested OP HD clinic be switched from [place] to [facility]. Navigator will follow closely.  UPDATE: CSW/L. Richard [name] and Renal Navigator met with patient together at bedside. He has decided he would like to stay in [place] for facility and therefore, will remain at [place] for OP HD. Per CSW, patient will discharge today, so Navigator has notified Renal PA/K. Stovall to send HD orders and informed clinic that patient will start tomorrow.   Alphonzo Cruise, [place] Renal Navigator [phone]

## 2020-05-07 NOTE — Discharge Summary (Signed)
Physician Discharge Summary  Richard [name] ZHY:[id number] DOB: Dec 10, 1955 DOA: 04/10/2020  PCP: [name], MD  Admit date: 04/10/2020 Discharge date: 05/07/2020  Admitted From: Home  Disposition:   SNF   Recommendations for Outpatient Follow-up and new medication changes:  1. Follow up with Dr. Ethlyn Gallery in 7 days.  2. Continue with outpatient hemodialysis.   Home Health: no   Equipment/Devices: no    Discharge Condition: stable  CODE STATUS: full  Diet recommendation: heart healthy   Brief/Interim Summary: Patient admitted to the hospital withthe working diagnosis ofmetabolic encephalopathy due to seizures, hypertensive emergency, acute stroke and uremia.Now on hemodialysis.  64 year old male with prolonged hospitalization, he does have the significant past medical history for type 2 diabetes mellitus, osteoarthritis, hypertension, chronic kidney disease, history of CVA with residual right-sided weakness who presented with seizures. Apparently noncompliant with his seizure medications, he was witnessed to have a generalized seizures for about 4 minutes. In the emergency department he was postictal,andslowly recovered his mentation. On his initial physical examination his blood pressurewas216/115, heart rate 89, temperature 98.3, respiratory rate 21, oxygen saturation 98%, his lungs were clear to auscultation bilaterally, heart S1-S2, present rhythmic, soft abdomen, no lower extremity edema, his mentationslowlyimproved,and by the time of his amdmissionhe was alert, oriented x3,andnonfocal.  On admission: Sodium 143, potassium 4.2, chloride 115, bicarb 13, glucose 106, BUN 76, creatinine 9.98, hemoglobin 10.3, hematocrit 31.5, platelets 192. Head CT with no acute intracranial process.  Patient was admitted and loaded with Keppra with good response. Further work-up with brain MRI showed 2 mm acute to early subacute infarct within the left parietal lobe.  Lumbar puncture was negative for infection.  Patient had recurrent encephalopathy and altered mental status that was slowly recovered. He did required tube feedings per NG tube during his hospitalization.   His seizures were controlled with Keppra and Vimpat, follow EEG no active seizures, now has been changed to monotherapy with Keppra.  Patient continued to have worsening kidney function, and right internal jugular veinHDcatheter was placed and patient underwent hemodialysis.On 08/04 he had a left radiocephalic AV fistula creation.  His HIVtestwas positive and patientwasstarted treatment with anti-retroviral therapy.  Patient will be transferred to SNF to continue his recovery.    1. Acute metabolic encephalopathy. Clinically patient has improved and his encephalopathy has resolved.  Continue to be very debilitated. Continue melatonin for insomnia.   2. Acute (2 mm acute to early subacute infarct within the left parietal lobe) on chronic CVA/ chronic right sided weakness and right upper extremity contracture.  Patient withacute onchronic right sided weakness and ambulatory dysfunction.  Tolerating welldual antiplatelet therapy with asa and clopidogrel.Continue with pravastatin.  3. Recurrent seizures.OnKeppra 500 mg bid.No clinical signs of active seizures.   4. ESRD on HD. Now patient receiving renal replacement therapy with HD. Has a right IJ tunneled HD catheter in placed and a left upper extremity AV fistula has been created.  Tolerating well chair HD.   5. HTN/ hypertensive emergency.Blood pressure control with amlodipine and carvedilol  6. Controlled T2DM Hgb A1c 5,2/ dyslipidemia.Onbasal 5 units glargine dailyplusinsulin sliding scale, for glucose cover and monitoring. Patient is tolerating po well.   On pravastatin 80 mg daily.   7. HIV. CD 39, viral load 624,000.ONantiretroviral therapy. PCP prophylaxis with  bactrim. Outpatient follow up.   8. Psoriasis dermatitis.Sp skin biopsy 07/30. Continue local skin care.   9. Moderate calorie protein malnutrition and anemia of chronic renal disease/ iron deficiency.Continue withdarbepoetin plus iron supplementation  Discharge Diagnoses:  Principal Problem:   Encephalopathy Active Problems:   Tobacco abuse   Malnutrition of moderate degree (HCC)   Hyperlipidemia   Neuropathy in diabetes (HCC)   GERD (gastroesophageal reflux disease)   Diabetes mellitus, type II, insulin dependent (HCC)   History of cerebrovascular accident (CVA) with residual deficit   Seizure ([place])   AIDS (acquired immune deficiency syndrome) ([place])   ESRD (end stage renal disease) ([place])   Goals of care, counseling/discussion   Hypertensive urgency   Palliative care by specialist   Encounter for feeding tube placement    Discharge Instructions   Allergies as of 05/07/2020   No Known Allergies     Medication List    STOP taking these medications   chlorthalidone 25 MG tablet Commonly known as: HYGROTON   cloNIDine 0.2 MG tablet Commonly known as: CATAPRES   famotidine 20 MG tablet Commonly known as: Pepcid   furosemide 40 MG tablet Commonly known as: LASIX   hydrALAZINE 25 MG tablet Commonly known as: APRESOLINE   hydrOXYzine 25 MG tablet Commonly known as: ATARAX/VISTARIL   NovoLOG Mix 70/30 FlexPen (70-30) 100 UNIT/ML FlexPen Generic drug: insulin aspart protamine - aspart   Oxycodone HCl 20 MG Tabs   potassium chloride SA 20 MEQ tablet Commonly known as: KLOR-CON     TAKE these medications   Accu-Chek Aviva Plus test strip Generic drug: glucose blood 1 each by Other route See admin instructions. Use 1 test strip to test blood sugar two-three times daily What changed: Another medication with the same name was changed. Make sure you understand how and when to take each.   Accu-Chek Aviva Plus test strip Generic drug: glucose blood USE  1 strip TO test blood sugar 2 TIMES DAILY TO 3 TIMES DAILY What changed: See the new instructions.   amLODipine 10 MG tablet Commonly known as: NORVASC TAKE 1 TABLET BY MOUTH DAILY   bictegravir-emtricitabine-tenofovir AF 50-200-25 MG Tabs tablet Commonly known as: BIKTARVY Take 1 tablet by mouth daily.   blood glucose meter kit and supplies Kit Dispense based on patient and insurance preference. Check blood sugar 4 times a day What changed:   how much to take  how to take this  when to take this   calcitRIOL 0.25 MCG capsule Commonly known as: ROCALTROL Take 1 capsule (0.25 mcg total) by mouth daily.   carvedilol 3.125 MG tablet Commonly known as: COREG Take 1 tablet (3.125 mg total) by mouth 2 (two) times daily with a meal.   clopidogrel 75 MG tablet Commonly known as: PLAVIX Take 1 tablet (75 mg total) by mouth daily.   Commode 3-In-1 Misc Use as directed   Transfer Bench Misc Use as directed   feeding supplement (NEPRO CARB STEADY) Liqd Take 237 mLs by mouth 3 (three) times daily between meals.   hydrocortisone cream 1 % Apply 1 application topically 4 (four) times daily as needed for itching.   insulin aspart 100 UNIT/ML injection Commonly known as: novoLOG Inject 0-9 Units into the skin 3 (three) times daily with meals. For glucose 150 to 200 use 2 units, for 201 to 250 use 3 units, for 251 to 300 use 5 units, for 301 to 350 use 7 units for 351 units or above use 9 units.   insulin glargine 100 UNIT/ML injection Commonly known as: LANTUS Inject 0.05 mLs (5 Units total) into the skin at bedtime.   Insulin Pen Needle 31G X 8 MM Misc Commonly known as: Sure Comfort  Pen Needles Use as directed twice daily with Novolog flex pen What changed:   how much to take  how to take this  when to take this   levETIRAcetam 500 MG tablet Commonly known as: Keppra Take 1 tablet (500 mg total) by mouth 2 (two) times daily.   melatonin 3 MG Tabs tablet Take 1  tablet (3 mg total) by mouth at bedtime for 15 days.   multivitamin Tabs tablet Take 1 tablet by mouth at bedtime.   Narcan 4 MG/0.1ML Liqd nasal spray kit Generic drug: naloxone Place 0.4 mg into the nose once.   pantoprazole 40 MG tablet Commonly known as: PROTONIX TAKE 1 TABLET BY MOUTH EVERY DAY   pravastatin 80 MG tablet Commonly known as: PRAVACHOL Take 1 tablet (80 mg total) by mouth at bedtime.   sildenafil 20 MG tablet Commonly known as: REVATIO Take 20-100 mg by mouth as needed for erectile dysfunction.   sulfamethoxazole-trimethoprim 800-160 MG tablet Commonly known as: BACTRIM DS Take 1 tablet by mouth 3 (three) times a week.   tamsulosin 0.4 MG Caps capsule Commonly known as: FLOMAX TAKE 1 CAPSULE BY MOUTH EVERY DAY   TRUEplus Insulin Syringe 31G X 5/16" 1 ML Misc Generic drug: Insulin Syringe-Needle U-100 Use pen needle with insulin 2 times daily What changed: See the new instructions.   Vitamin D 50 MCG (2000 UT) tablet Take 1 tablet (2,000 Units total) by mouth in the morning.       Follow-up Information    Early, Arvilla Meres, MD.   Specialties: Vascular Surgery, Cardiology Why: office will call you to arrange your appt (sent) Contact information: [address] [place] [address] [phone]              No Known Allergies  Consultations:  Nephrology   IR   ID   Neurology    Procedures/Studies: CT Head Wo Contrast  Result Date: 04/10/2020 CLINICAL DATA:  Seizure, altered level of consciousness EXAM: CT HEAD WITHOUT CONTRAST TECHNIQUE: Contiguous axial images were obtained from the base of the skull through the vertex without intravenous contrast. COMPARISON:  03/27/2020 FINDINGS: Brain: Evaluation is limited due to patient motion during the exam. No signs of acute infarct or hemorrhage. Lateral ventricles and midline structures are stable. No acute extra-axial fluid collections. No mass effect. Vascular: No hyperdense vessel or  unexpected calcification. Skull: Normal. Negative for fracture or focal lesion. Sinuses/Orbits: There is mucosal thickening within the bilateral maxillary sinuses and left frontal sinus, grossly stable. Other: None. IMPRESSION: 1. No acute intracranial process on this exam limited by excessive patient motion. No change since previous study. Electronically Signed   By: Randa Ngo M.D.   On: 04/10/2020 21:59   [name]  Result Date: 04/12/2020 CLINICAL DATA:  Neuro deficit, acute, stroke suspected; concern for seizure versus stroke. EXAM: MRI HEAD WITHOUT CONTRAST TECHNIQUE: Multiplanar, multiecho pulse sequences of the brain and surrounding structures were obtained without intravenous contrast. COMPARISON:  Head CT 04/12/2020, head CT 04/10/2020, brain MRI 02/13/2019 FINDINGS: Brain: Mild intermittent motion degradation. Stable, mild generalized parenchymal atrophy. 2 mm focus of subtle restricted diffusion within the left parietal periventricular white matter consistent with small acute or early subacute infarct (series 4, image 11). There is no convincing evidence of acute infarct elsewhere within the brain. Stable moderate to advanced multifocal T2/FLAIR hyperintensity within the cerebral white matter which is nonspecific, but consistent with chronic small vessel ischemic disease. To a lesser degree, chronic small vessel ischemic  changes are present within the pons. Redemonstrated chronic small-vessel infarct within the left corona radiata/basal ganglia. Small chronic lacunar infarcts also redemonstrated within the posterior right lentiform nucleus, thalami and left cerebellum. As before, there are few scattered chronic microhemorrhages within the basal ganglia, left thalamus and left cerebellum. This likely reflect sequela of chronic hypertensive microangiopathy. The hippocampi are symmetric in size and signal. No evidence of intracranial mass. No extra-axial fluid collection. No midline  shift. Unchanged small arachnoid cyst overlying the anterior left frontal lobe. Vascular: Expected proximal arterial flow voids. Skull and upper cervical spine: No focal marrow lesion. Sinuses/Orbits: Visualized orbits show no acute finding. Paranasal sinus mucosal thickening. Most notably, there is severe left frontal sinus mucosal thickening and moderate mucosal thickening within the ethmoid, sphenoid and maxillary sinuses. Trace fluid within left mastoid air cells. Other: Nonspecific cystic lesion in along the left external ear measuring 7 mm (series 6, image 1) IMPRESSION: Mildly motion degraded examination. 2 mm acute or early subacute infarct within the left parietal lobe periatrial white matter. Stable moderate to advanced chronic small vessel ischemic disease. Redemonstrated chronic small-vessel infarcts within the left corona radiata/basal ganglia, right basal ganglia, thalami and left cerebellum. Stable, mild generalized parenchymal atrophy. 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 Pansinusitis. Trace left mastoid effusion. Nonspecific 7 mm cystic appearing lesion along the left external ear. Direct visualization recommended. Electronically Signed   By: Kellie Simmering DO   On: 04/12/2020 12:10   US RENAL  Result Date: 04/11/2020 CLINICAL DATA:  Elevated creatinine. EXAM: RENAL / URINARY TRACT ULTRASOUND COMPLETE COMPARISON:  06/24/2019 and CT abdomen pelvis 09/03/2017 FINDINGS: Right Kidney: Renal measurements: 10.0 x 5.4 x 3.3 centimeters = volume: 93.1 mL. Renal parenchyma is heterogeneous and echogenic. Cyst previously identified in the UPPER pole region RIGHT kidney is not well seen. Suspect small parapelvic cyst. Left Kidney: Renal measurements: 9.2 x 4.2 x 3.5 centimeters = volume: 10.5 mL. Renal parenchyma is heterogeneous and echogenic. Bladder: Appears normal for degree of bladder distention. Other: None.  IMPRESSION: No hydronephrosis. Heterogeneous echogenic renal parenchyma bilaterally. Electronically Signed   By: Nolon Nations M.D.   On: 04/11/2020 18:33   IR Fluoro Guide CV Line Right  Result Date: 04/14/2020 CLINICAL DATA:  End-stage renal disease, needs access for hemodialysis EXAM: TUNNELED HEMODIALYSIS CATHETER PLACEMENT WITH ULTRASOUND AND FLUOROSCOPIC GUIDANCE TECHNIQUE: The procedure, risks, benefits, and alternatives were explained to the patient. Questions regarding the procedure were encouraged and answered. The patient understands and consents to the procedure. As antibiotic prophylaxis, cefazolin 2 g was ordered pre-procedure and administered intravenously within one hour of incision.Patency of the right IJ vein was confirmed with ultrasound with image documentation. An appropriate skin site was determined. Region was prepped using maximum barrier technique including cap and mask, sterile gown, sterile gloves, large sterile sheet, and Chlorhexidine as cutaneous antisepsis. The region was infiltrated locally with 1% lidocaine. Intravenous versed 90m were administered as conscious sedation during continuous monitoring of the patient's level of consciousness and physiological / cardiorespiratory status by the radiology RN, with a total moderate sedation time of 10 minutes. Under real-time ultrasound guidance, the right IJ vein was accessed with a 21 gauge micropuncture needle; the needle tip within the vein was confirmed with ultrasound image documentation. Needle exchanged over the 018 guidewire for transitional dilator, which allowed advancement of a Benson wire into the IVC. Over this, an MPA catheter was advanced. A Palindrome 19 hemodialysis catheter was tunneled from the right  anterior chest wall approach to the right IJ dermatotomy site. The MPA catheter was exchanged over an Amplatz wire for serial vascular dilators which allow placement of a peel-away sheath, through which the catheter  was advanced under intermittent fluoroscopy, positioned with its tips in the proximal and midright atrium. Spot chest radiograph confirms good catheter position. No pneumothorax. Catheter was flushed and primed per protocol. Catheter secured externally with O Prolene sutures. The right IJ dermatotomy site was closed with Dermabond. COMPLICATIONS: COMPLICATIONS None immediate COMPARISON:  None IMPRESSION: 1. Technically successful placement of tunneled right IJ hemodialysis catheter with ultrasound and fluoroscopic guidance. Ready for routine use. ACCESS: Remains approachable for percutaneous intervention as needed. Electronically Signed   By: Lucrezia Europe M.D.   On: 04/14/2020 08:15   IR US Guide Vasc Access Right  Result Date: 04/14/2020 CLINICAL DATA:  End-stage renal disease, needs access for hemodialysis EXAM: TUNNELED HEMODIALYSIS CATHETER PLACEMENT WITH ULTRASOUND AND FLUOROSCOPIC GUIDANCE TECHNIQUE: The procedure, risks, benefits, and alternatives were explained to the patient. Questions regarding the procedure were encouraged and answered. The patient understands and consents to the procedure. As antibiotic prophylaxis, cefazolin 2 g was ordered pre-procedure and administered intravenously within one hour of incision.Patency of the right IJ vein was confirmed with ultrasound with image documentation. An appropriate skin site was determined. Region was prepped using maximum barrier technique including cap and mask, sterile gown, sterile gloves, large sterile sheet, and Chlorhexidine as cutaneous antisepsis. The region was infiltrated locally with 1% lidocaine. Intravenous versed 67m were administered as conscious sedation during continuous monitoring of the patient's level of consciousness and physiological / cardiorespiratory status by the radiology RN, with a total moderate sedation time of 10 minutes. Under real-time ultrasound guidance, the right IJ vein was accessed with a 21 gauge micropuncture  needle; the needle tip within the vein was confirmed with ultrasound image documentation. Needle exchanged over the 018 guidewire for transitional dilator, which allowed advancement of a Benson wire into the IVC. Over this, an MPA catheter was advanced. A Palindrome 19 hemodialysis catheter was tunneled from the right anterior chest wall approach to the right IJ dermatotomy site. The MPA catheter was exchanged over an Amplatz wire for serial vascular dilators which allow placement of a peel-away sheath, through which the catheter was advanced under intermittent fluoroscopy, positioned with its tips in the proximal and midright atrium. Spot chest radiograph confirms good catheter position. No pneumothorax. Catheter was flushed and primed per protocol. Catheter secured externally with O Prolene sutures. The right IJ dermatotomy site was closed with Dermabond. COMPLICATIONS: COMPLICATIONS None immediate COMPARISON:  None IMPRESSION: 1. Technically successful placement of tunneled right IJ hemodialysis catheter with ultrasound and fluoroscopic guidance. Ready for routine use. ACCESS: Remains approachable for percutaneous intervention as needed. Electronically Signed   By: DLucrezia EuropeM.D.   On: 04/14/2020 08:15   DG Abd Portable 1V  Result Date: 04/13/2020 CLINICAL DATA:  Feeding tube placement. EXAM: PORTABLE ABDOMEN - 1 VIEW COMPARISON:  None. FINDINGS: A small bore feeding tube is noted with tip overlying the proximal-mid stomach. No dilated bowel loops are identified. IMPRESSION: Small bore feeding tube with tip overlying the proximal-mid stomach. Electronically Signed   By: JMargarette CanadaM.D.   On: 04/13/2020 15:19   Overnight EEG with video  Result Date: 04/13/2020 YLora Havens MD     04/14/2020 11:04 AM .Patient Name: MKeishon ChavarinMRN: [id number] Attending: PLora HavensReferring Physician/Provider: Dr SDonnetta SimpersDuration: 04/12/2020 1255 to 04/13/2020  1255  Patient history:  64yo M with forced right gaze deviation concerning for seizure. EEG to evaluate for seizure.  Level of alertness: Awake, asleep  AEDs during EEG study: LEV, vimpat  Technical aspects: This EEG study was done with scalp electrodes positioned according to the 10-20 International system of electrode placement. Electrical activity was acquired at a sampling rate of '500Hz'  and reviewed with a high frequency filter of '70Hz'  and a low frequency filter of '1Hz' . EEG data were recorded continuously and digitally stored.  Description: No posterior dominant rhythm was seen. Sleep was characterized by vertex waves, sleep spindles (12 to 14 Hz), maximal frontocentral region.  EEG showed continuous generalized rhythmic 3 to 6 Hz theta-delta slowing. Hyperventilation and photic stimulation were not performed.    ABNORMALITY -Continuous rhythmic slow, generalized  IMPRESSION: This study is suggestive of moderate diffuse encephalopathy, nonspecific etiology. No seizures or epileptiform discharges were seen throughout the recording.  Priyanka Barbra Sarks   EEG LTVM - Continuous Bedside W/ Video Includes Portable EEG Read  Result Date: 04/12/2020 Lora Havens, MD     04/12/2020  1:04 PM Patient Name: Kashis Penley MRN: [id number] Epilepsy Attending: Lora Havens Referring Physician/Provider: Dr Donnetta Simpers Date: 04/12/2020 Duration: 25.21 mins Patient history: 64yo M with forced right gaze deviation concerning for seizure. EEG to evaluate for seizure. Level of alertness: Awake, asleep AEDs during EEG study: LEV, vimpat Technical aspects: This EEG study was done with scalp electrodes positioned according to the 10-20 International system of electrode placement. Electrical activity was acquired at a sampling rate of '500Hz'  and reviewed with a high frequency filter of '70Hz'  and a low frequency filter of '1Hz' . EEG data were recorded continuously and digitally stored. Description: No posterior dominant rhythm was seen.  Sleep was characterized by vertex waves, sleep spindles (12 to 14 Hz), maximal frontocentral region.  EEG showed continuous generalized rhythmic 3 to 6 Hz theta-delta slowing. Hyperventilation and photic stimulation were not performed.   ABNORMALITY -Continuous rhythmic slow, generalized IMPRESSION: This study is suggestive of moderate diffuse encephalopathy, nonspecific etiology. No seizures or epileptiform discharges were seen throughout the recording. Lora Havens   CT HEAD CODE STROKE WO CONTRAST  Addendum Date: 04/12/2020   ADDENDUM REPORT: 04/12/2020 10:49 ADDENDUM: These results were called by telephone at the time of interpretation on 04/12/2020 at 10:49 am to provider Sal, who verbally acknowledged these results. Electronically Signed   By: Franchot Gallo M.D.   On: 04/12/2020 10:49   Result Date: 04/12/2020 CLINICAL DATA:  Code stroke. Acute neuro deficit. Rule out stroke. Right-sided gaze preference. EXAM: CT HEAD WITHOUT CONTRAST TECHNIQUE: Contiguous axial images were obtained from the base of the skull through the vertex without intravenous contrast. COMPARISON:  CT head 04/10/2020.  MRI head 02/13/2020 FINDINGS: Brain: Generalized atrophy. Prominent white matter hypodensity bilaterally similar to prior studies compatible with chronic microvascular ischemia. Findings stable compared with the prior study. Negative for acute hemorrhage or mass. Vascular: Negative for hyperdense vessel Skull: No focal skeletal lesion. Sinuses/Orbits: Moderate to extensive mucosal edema paranasal sinuses. Negative orbit Other: None ASPECTS ([place] Stroke Program Early CT Score) - Ganglionic level infarction (caudate, lentiform nuclei, internal capsule, insula, M1-M3 cortex): 7 - Supraganglionic infarction (M4-M6 cortex): 3 Total score (0-10 with 10 being normal): 10 IMPRESSION: 1. Extensive white matter changes consistent with chronic microvascular ischemia. This is stable from prior studies. No acute  abnormality. 2. ASPECTS is 10 Electronically Signed: By: Franchot Gallo M.D. On: 04/12/2020 10:40  VAS [place] UPPER EXT VEIN MAPPING (PRE-OP AVF)  Result Date: 04/13/2020 UPPER EXTREMITY VEIN MAPPING  Indications: Pre-access. Comparison Study: no priro Performing Technologist: Abram Sander RVS  Examination Guidelines: A complete evaluation includes B-mode imaging, spectral Doppler, color Doppler, and power Doppler as needed of all accessible portions of each vessel. Bilateral testing is considered an integral part of a complete examination. Limited examinations for reoccurring indications may be performed as noted. +-------------+-------------+----------+--------+ Left CephalicDiameter (cm)Depth (cm)Findings +-------------+-------------+----------+--------+  +-----------------+-------------+----------+---------+ Left Cephalic    Diameter (cm)Depth (cm)Findings  +-----------------+-------------+----------+---------+ Shoulder             0.43        0.64             +-----------------+-------------+----------+---------+ Prox upper arm       0.36        0.42             +-----------------+-------------+----------+---------+ Mid upper arm        0.36        0.40             +-----------------+-------------+----------+---------+ Dist upper arm       0.40        0.33   branching +-----------------+-------------+----------+---------+ Antecubital fossa    0.48        0.80             +-----------------+-------------+----------+---------+ Prox forearm         0.20        0.48   branching +-----------------+-------------+----------+---------+ Mid forearm          0.11        0.39             +-----------------+-------------+----------+---------+ Dist forearm         0.14        0.24             +-----------------+-------------+----------+---------+ +-----------------+-------------+----------+--------+ Left Basilic     Diameter (cm)Depth (cm)Findings  +-----------------+-------------+----------+--------+ Prox upper arm       0.43        0.77            +-----------------+-------------+----------+--------+ Mid upper arm        0.33        0.79            +-----------------+-------------+----------+--------+ Dist upper arm       0.29        0.72            +-----------------+-------------+----------+--------+ Antecubital fossa    0.34        0.68            +-----------------+-------------+----------+--------+ Prox forearm         0.29        0.19            +-----------------+-------------+----------+--------+ Mid forearm          0.27        0.27            +-----------------+-------------+----------+--------+ Distal forearm       0.28        0.32            +-----------------+-------------+----------+--------+ Wrist                0.16        0.32            +-----------------+-------------+----------+--------+ *See table(s) above for measurements and observations.  Diagnosing physician: Monica Martinez MD Electronically signed  by Monica Martinez MD on 04/13/2020 at 4:31:20 PM.    Final    DG FLUORO GUIDE LUMBAR PUNCTURE  Result Date: 04/14/2020 CLINICAL DATA:  64 year old male with history of altered mental status. 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 History of HIV. EXAM: DIAGNOSTIC LUMBAR PUNCTURE UNDER FLUOROSCOPIC GUIDANCE FLUOROSCOPY TIME:  Fluoroscopy Time:  1.2 minutes Radiation Exposure Index (if provided by the fluoroscopic device): 7.8 mGy PROCEDURE: Informed consent was obtained from the patient prior to the procedure, including potential complications of headache, allergy, and pain. With the patient prone, the lower back was prepped with Betadine. 1% Lidocaine was used for local anesthesia. Lumbar puncture was performed at the L2-L3 level using a 22 gauge needle with return of clear CSF with an opening pressure of 18 cm water. 32 ml of CSF were obtained for laboratory studies. Closing pressure was not  measurable. The patient tolerated the procedure well and there were no apparent complications. IMPRESSION: 1. Successful uncomplicated fluoroscopic guided lumbar puncture, as above. Electronically Signed   By: Vinnie Langton M.D.   On: 04/14/2020 18:46       Procedures:  Right IJ HD catheter  Left upper extremitiy AV fistula  Subjective: Patient is feeling well this am, no nausea or vomiting, no chest pain or dyspnea, no confusion or agitation. Continue to have difficulty sleeping.   Discharge Exam: Vitals:   05/07/20 0416 05/07/20 0759  BP: (!) 137/59 (!) 145/71  Pulse: 86 81  Resp: 19 18  Temp: 98.6 F (37 C) 97.8 F (36.6 C)  SpO2: 98% 100%   Vitals:   05/06/20 2033 05/07/20 0015 05/07/20 0416 05/07/20 0759  BP: (!) 136/53 (!) 137/53 (!) 137/59 (!) 145/71  Pulse: 86 92 86 81  Resp: '18 18 19 18  ' Temp: 98.2 F (36.8 C) 97.7 F (36.5 C) 98.6 F (37 C) 97.8 F (36.6 C)  TempSrc: Oral Oral Oral Oral  SpO2: 95% 98% 98% 100%  Weight:      Height:        General: Not in pain or dyspnea, deconditioned  Neurology: Awake and alert, right upper extremity contracted and weak.  E ENT: no pallor, no icterus, oral mucosa moist Cardiovascular: No JVD. S1-S2 present, rhythmic, no gallops, rubs, or murmurs. No lower extremity edema. Pulmonary: vesicular breath sounds bilaterally, adequate air movement, no wheezing, rhonchi or rales. Gastrointestinal. Abdomen soft and non tender Skin. No rashes Musculoskeletal: no joint deformities Right IJ HD cathter Left upper ext fistula.    The results of significant diagnostics from this hospitalization (including imaging, microbiology, ancillary and laboratory) are listed below for reference.     Microbiology: Recent Results (from the past 240 hour(s))  SARS CORONAVIRUS 2 (TAT 6-24 HRS) Nasopharyngeal Nasopharyngeal Swab     Status: None   Collection Time: 04/30/20  4:55 AM   Specimen: Nasopharyngeal Swab  Result Value Ref Range  Status   SARS Coronavirus 2 NEGATIVE NEGATIVE Final    Comment: (NOTE) SARS-CoV-2 target nucleic acids are NOT DETECTED.  The SARS-CoV-2 RNA is generally detectable in upper and lower respiratory specimens during the acute phase of infection. Negative results do not preclude SARS-CoV-2 infection, do not rule out co-infections with other pathogens, and should not be used as the sole basis for treatment or other patient management decisions. Negative results must be combined with clinical observations, patient history, and epidemiological information. The expected result is Negative.  Fact Sheet for Patients: SugarRoll.be  Fact Sheet for Healthcare Providers: https://www.woods-mathews.com/  This test is not yet approved  or cleared by the [place] and  has been authorized for detection and/or diagnosis of SARS-CoV-2 by FDA under an Emergency Use Authorization (EUA). This EUA will remain  in effect (meaning this test can be used) for the duration of the COVID-19 declaration under Se ction 564(b)(1) of the Act, 21 U.S.C. section 360bbb-3(b)(1), unless the authorization is terminated or revoked sooner.  Performed at [facility], [place] [address], [place], [place] [address]      Labs: BNP (last 3 results) No results for input(s): BNP in the last 8760 hours. Basic Metabolic Panel: Recent Labs  Lab 05/01/20 0723 05/03/20 1142  NA 139 141  K 4.1 4.0  CL 101 103  CO2 26 27  GLUCOSE 146* 193*  BUN 43* 26*  CREATININE 6.66* 4.92*  CALCIUM 8.3* 8.3*  PHOS 2.9 2.6   Liver Function Tests: Recent Labs  Lab 05/01/20 0723 05/03/20 1142  ALBUMIN 2.3* 2.5*   No results for input(s): LIPASE, AMYLASE in the last 168 hours. No results for input(s): AMMONIA in the last 168 hours. CBC: Recent Labs  Lab 05/01/20 0723 05/03/20 1728  WBC 6.1 3.9*  HGB 7.2* 7.3*  HCT 23.1* 24.0*  MCV 97.5 99.6  PLT 224 177   Cardiac  Enzymes: No results for input(s): CKTOTAL, CKMB, CKMBINDEX, TROPONINI in the last 168 hours. BNP: Invalid input(s): POCBNP CBG: Recent Labs  Lab 05/06/20 0603 05/06/20 1204 05/06/20 1814 05/06/20 2102 05/07/20 0758  GLUCAP 89 145* 178* 196* 57*   D-Dimer No results for input(s): DDIMER in the last 72 hours. Hgb A1c No results for input(s): HGBA1C in the last 72 hours. Lipid Profile No results for input(s): CHOL, HDL, LDLCALC, TRIG, CHOLHDL, LDLDIRECT in the last 72 hours. Thyroid function studies No results for input(s): TSH, T4TOTAL, T3FREE, THYROIDAB in the last 72 hours.  Invalid input(s): FREET3 Anemia work up No results for input(s): VITAMINB12, FOLATE, FERRITIN, TIBC, IRON, RETICCTPCT in the last 72 hours. Urinalysis    Component Value Date/Time   COLORURINE STRAW (A) 04/11/2020 0223   APPEARANCEUR CLEAR 04/11/2020 0223   LABSPEC 1.011 04/11/2020 0223   PHURINE 5.0 04/11/2020 0223   GLUCOSEU 50 (A) 04/11/2020 0223   HGBUR SMALL (A) 04/11/2020 0223   BILIRUBINUR NEGATIVE 04/11/2020 0223   KETONESUR NEGATIVE 04/11/2020 0223   PROTEINUR >=300 (A) 04/11/2020 0223   UROBILINOGEN 0.2 11/29/2014 2003   NITRITE NEGATIVE 04/11/2020 0223   LEUKOCYTESUR NEGATIVE 04/11/2020 0223   Sepsis Labs Invalid input(s): PROCALCITONIN,  WBC,  LACTICIDVEN Microbiology Recent Results (from the past 240 hour(s))  SARS CORONAVIRUS 2 (TAT 6-24 HRS) Nasopharyngeal Nasopharyngeal Swab     Status: None   Collection Time: 04/30/20  4:55 AM   Specimen: Nasopharyngeal Swab  Result Value Ref Range Status   SARS Coronavirus 2 NEGATIVE NEGATIVE Final    Comment: (NOTE) SARS-CoV-2 target nucleic acids are NOT DETECTED.  The SARS-CoV-2 RNA is generally detectable in upper and lower respiratory specimens during the acute phase of infection. Negative results do not preclude SARS-CoV-2 infection, do not rule out co-infections with other pathogens, and should not be used as the sole basis for  treatment or other patient management decisions. Negative results must be combined with clinical observations, patient history, and epidemiological information. The expected result is Negative.  Fact Sheet for Patients: SugarRoll.be  Fact Sheet for Healthcare Providers: https://www.woods-mathews.com/  This test is not yet approved or cleared by the [place] FDA and  has been authorized for detection and/or  diagnosis of SARS-CoV-2 by FDA under an Emergency Use Authorization (EUA). This EUA will remain  in effect (meaning this test can be used) for the duration of the COVID-19 declaration under Se ction 564(b)(1) of the Act, 21 U.S.C. section 360bbb-3(b)(1), unless the authorization is terminated or revoked sooner.  Performed at [facility], [place] [address] [place], [place] [address]      Time coordinating discharge: 45 minutes  SIGNED:   Tawni Millers, MD  [facility] 05/07/2020, 10:45 AM

## 2020-05-07 NOTE — Progress Notes (Signed)
Physical Therapy Treatment Patient Details Name: Richard [name] MRN: [id number] DOB: 1956-04-27 Today's Date: 05/07/2020    History of Present Illness 64 year old male with medical history of Arthritis, Diabetes mellitus, type II, insulin dependent, GERD, HTN, Immune deficiency disorder, migraines, neuropathy in diabetes, ruptured lumbar disc, ESRD stage V , GFR < 15 ml/min, declined  Dialysis in the past, CVA with residual right sided weakness, seizures admitted 7/20 through ED with seizure activity lasting 4 minutes per roommate, and encephalopathy. MRI of brain showed 2 mm infarct in left parietal lobe. A tunneled dialysis catheter was placed and  Dialysis was started on 7/23; New diagnosis this admission of HIV/AIDS     PT Comments    Pt progressing slowly towards physical therapy goals. He was able to demonstrate transfers to/from EOB with up to min assist, however was refusing to attempt OOB mobility at this time. The [facility] was present and pt adamant that he needed to move slower and attempting to stand was "moving too fast". Pt was agreeable to therapeutic exercise in the bed. SNF remains most appropriate d/c disposition at this time.    Follow Up Recommendations  SNF;Supervision/Assistance - 24 hour     Equipment Recommendations  None recommended by PT    Recommendations for Other Services       Precautions / Restrictions Precautions Precautions: Fall Precaution Comments: NG now removed Restrictions Weight Bearing Restrictions: No    Mobility  Bed Mobility Overal bed mobility: Needs Assistance Bed Mobility: Supine to Sit;Sit to Supine     Supine to sit: Min assist Sit to supine: Supervision   General bed mobility comments: Pt was able to transition to EOB largely without assist, however with L lateral LOB requiring assist to recover.  Transfers                 General transfer comment: Refused to attempt standing with the [facility] after we had it set up  in front of him. Encouragement and explanation of physical benefits provided however pt repeating "I'm like a baby that needs to learn how to walk again. I need to take this slow."  Ambulation/Gait             General Gait Details: unable   Stairs             Wheelchair Mobility    Modified Rankin (Stroke Patients Only) Modified Rankin (Stroke Patients Only) Pre-Morbid Rankin Score: No symptoms (not sure pre morbid) Modified Rankin: Severe disability     Balance Overall balance assessment: Needs assistance Sitting-balance support: No upper extremity supported;Feet supported Sitting balance-Leahy Scale: Fair Sitting balance - Comments: sat EOB preferring UE support, but able to maintain balance staying within BOS without UE Postural control: Left lateral lean;Posterior lean                                  Cognition Arousal/Alertness: Awake/alert Behavior During Therapy: Flat affect Overall Cognitive Status: Impaired/Different from baseline Area of Impairment: Attention;Following commands;Safety/judgement;Awareness;Problem solving                   Current Attention Level: Sustained Memory: Decreased recall of precautions;Decreased short-term memory Following Commands: Follows one step commands inconsistently;Follows one step commands with increased time Safety/Judgement: Decreased awareness of deficits;Decreased awareness of safety Awareness: Intellectual Problem Solving: Slow processing;Decreased initiation General Comments: patient alert and following simple commands, contniues to require increased time for processing  and planning with poor awareness to deficits and need for assist.       Exercises General Exercises - Lower Extremity Short Arc Quad: 10 reps;Right;Supine Long Arc Quad: 15 reps;Left;5 reps;Right;Seated Heel Slides: Supine;10 reps;Right Hip ABduction/ADduction: AAROM;Both;10 reps;Supine Straight Leg Raises: 10  reps;Left Other Exercises Other Exercises: Supine HS stretch 3x20"     General Comments        Pertinent Vitals/Pain Pain Assessment: No/denies pain Faces Pain Scale: No hurt    Home Living                      Prior Function            PT Goals (current goals can now be found in the care plan section) Acute Rehab PT Goals Patient Stated Goal: to get some rest PT Goal Formulation: With patient Time For Goal Achievement: 05/07/20 Potential to Achieve Goals: Fair Progress towards PT goals: Progressing toward goals    Frequency    Min 2X/week      PT Plan Discharge plan needs to be updated    Co-evaluation              AM-PAC PT "6 Clicks" Mobility   Outcome Measure  Help needed turning from your back to your side while in a flat bed without using bedrails?: A Little Help needed moving from lying on your back to sitting on the side of a flat bed without using bedrails?: A Little Help needed moving to and from a bed to a chair (including a wheelchair)?: Total Help needed standing up from a chair using your arms (e.g., wheelchair or bedside chair)?: Total Help needed to walk in hospital room?: Total Help needed climbing 3-5 steps with a railing? : Total 6 Click Score: 10    End of Session   Activity Tolerance: Other (comment) (Self-limiting) Patient left: in bed;with call bell/phone within reach Nurse Communication: Mobility status PT Visit Diagnosis: Muscle weakness (generalized) (M62.81);Difficulty in walking, not elsewhere classified (R26.2)     Time: 1340-1405 PT Time Calculation (min) (ACUTE ONLY): 25 min  Charges:  $Therapeutic Exercise: 8-22 mins $Therapeutic Activity: 8-22 mins                     Rolinda Roan, PT, DPT Acute Rehabilitation Services Pager: [phone] Office: [phone]    [name] 05/07/2020, 2:25 PM

## 2020-05-07 NOTE — Progress Notes (Signed)
Report called to [facility]

## 2020-05-07 NOTE — Care Management Important Message (Signed)
Important Message  Patient Details  Name: Richard [name] MRN: [id number] Date of Birth: 1956/02/18   Medicare Important Message Given:  Yes     Orbie Pyo 05/07/2020, 3:34 PM

## 2020-05-08 DIAGNOSIS — T782XXA Anaphylactic shock, unspecified, initial encounter: Secondary | ICD-10-CM | POA: Insufficient documentation

## 2020-05-08 DIAGNOSIS — T829XXA Unspecified complication of cardiac and vascular prosthetic device, implant and graft, initial encounter: Secondary | ICD-10-CM | POA: Insufficient documentation

## 2020-05-08 DIAGNOSIS — D631 Anemia in chronic kidney disease: Secondary | ICD-10-CM | POA: Insufficient documentation

## 2020-05-08 DIAGNOSIS — D509 Iron deficiency anemia, unspecified: Secondary | ICD-10-CM | POA: Insufficient documentation

## 2020-05-08 DIAGNOSIS — R519 Headache, unspecified: Secondary | ICD-10-CM | POA: Insufficient documentation

## 2020-05-08 DIAGNOSIS — Z992 Dependence on renal dialysis: Secondary | ICD-10-CM | POA: Insufficient documentation

## 2020-05-08 DIAGNOSIS — T7840XA Allergy, unspecified, initial encounter: Secondary | ICD-10-CM | POA: Insufficient documentation

## 2020-05-08 DIAGNOSIS — N189 Chronic kidney disease, unspecified: Secondary | ICD-10-CM | POA: Insufficient documentation

## 2020-05-09 DIAGNOSIS — R569 Unspecified convulsions: Secondary | ICD-10-CM | POA: Diagnosis not present

## 2020-05-09 DIAGNOSIS — N186 End stage renal disease: Secondary | ICD-10-CM | POA: Diagnosis not present

## 2020-05-09 DIAGNOSIS — K219 Gastro-esophageal reflux disease without esophagitis: Secondary | ICD-10-CM | POA: Diagnosis not present

## 2020-05-09 DIAGNOSIS — I10 Essential (primary) hypertension: Secondary | ICD-10-CM | POA: Diagnosis not present

## 2020-05-09 DIAGNOSIS — E119 Type 2 diabetes mellitus without complications: Secondary | ICD-10-CM | POA: Diagnosis not present

## 2020-05-09 DIAGNOSIS — E785 Hyperlipidemia, unspecified: Secondary | ICD-10-CM | POA: Diagnosis not present

## 2020-05-09 DIAGNOSIS — B20 Human immunodeficiency virus [HIV] disease: Secondary | ICD-10-CM | POA: Diagnosis not present

## 2020-05-10 DIAGNOSIS — N186 End stage renal disease: Secondary | ICD-10-CM | POA: Diagnosis not present

## 2020-05-10 DIAGNOSIS — Z992 Dependence on renal dialysis: Secondary | ICD-10-CM | POA: Diagnosis not present

## 2020-05-12 DIAGNOSIS — Z992 Dependence on renal dialysis: Secondary | ICD-10-CM | POA: Diagnosis not present

## 2020-05-12 DIAGNOSIS — N186 End stage renal disease: Secondary | ICD-10-CM | POA: Diagnosis not present

## 2020-05-15 DIAGNOSIS — N186 End stage renal disease: Secondary | ICD-10-CM | POA: Diagnosis not present

## 2020-05-15 DIAGNOSIS — Z992 Dependence on renal dialysis: Secondary | ICD-10-CM | POA: Diagnosis not present

## 2020-05-16 DIAGNOSIS — N186 End stage renal disease: Secondary | ICD-10-CM | POA: Diagnosis not present

## 2020-05-16 DIAGNOSIS — U071 COVID-19: Secondary | ICD-10-CM

## 2020-05-16 DIAGNOSIS — I10 Essential (primary) hypertension: Secondary | ICD-10-CM | POA: Diagnosis not present

## 2020-05-16 DIAGNOSIS — E876 Hypokalemia: Secondary | ICD-10-CM | POA: Insufficient documentation

## 2020-05-16 DIAGNOSIS — E119 Type 2 diabetes mellitus without complications: Secondary | ICD-10-CM | POA: Diagnosis not present

## 2020-05-16 DIAGNOSIS — E785 Hyperlipidemia, unspecified: Secondary | ICD-10-CM | POA: Diagnosis not present

## 2020-05-16 DIAGNOSIS — B20 Human immunodeficiency virus [HIV] disease: Secondary | ICD-10-CM | POA: Diagnosis not present

## 2020-05-16 HISTORY — DX: COVID-19: U07.1

## 2020-05-17 DIAGNOSIS — N186 End stage renal disease: Secondary | ICD-10-CM | POA: Diagnosis not present

## 2020-05-17 DIAGNOSIS — Z992 Dependence on renal dialysis: Secondary | ICD-10-CM | POA: Diagnosis not present

## 2020-05-19 DIAGNOSIS — N186 End stage renal disease: Secondary | ICD-10-CM | POA: Diagnosis not present

## 2020-05-19 DIAGNOSIS — Z992 Dependence on renal dialysis: Secondary | ICD-10-CM | POA: Diagnosis not present

## 2020-05-21 LAB — FUNGAL ORGANISM REFLEX

## 2020-05-21 LAB — FUNGUS CULTURE WITH STAIN

## 2020-05-21 LAB — FUNGUS CULTURE RESULT

## 2020-05-22 DIAGNOSIS — E1122 Type 2 diabetes mellitus with diabetic chronic kidney disease: Secondary | ICD-10-CM | POA: Diagnosis not present

## 2020-05-22 DIAGNOSIS — Z992 Dependence on renal dialysis: Secondary | ICD-10-CM | POA: Diagnosis not present

## 2020-05-22 DIAGNOSIS — N186 End stage renal disease: Secondary | ICD-10-CM | POA: Diagnosis not present

## 2020-05-23 ENCOUNTER — Other Ambulatory Visit: Payer: Self-pay

## 2020-05-23 DIAGNOSIS — R569 Unspecified convulsions: Secondary | ICD-10-CM | POA: Diagnosis not present

## 2020-05-23 DIAGNOSIS — K219 Gastro-esophageal reflux disease without esophagitis: Secondary | ICD-10-CM | POA: Diagnosis not present

## 2020-05-23 DIAGNOSIS — N186 End stage renal disease: Secondary | ICD-10-CM | POA: Diagnosis not present

## 2020-05-23 DIAGNOSIS — B20 Human immunodeficiency virus [HIV] disease: Secondary | ICD-10-CM | POA: Diagnosis not present

## 2020-05-23 DIAGNOSIS — I10 Essential (primary) hypertension: Secondary | ICD-10-CM | POA: Diagnosis not present

## 2020-05-23 DIAGNOSIS — E119 Type 2 diabetes mellitus without complications: Secondary | ICD-10-CM | POA: Diagnosis not present

## 2020-05-23 DIAGNOSIS — N184 Chronic kidney disease, stage 4 (severe): Secondary | ICD-10-CM

## 2020-05-24 ENCOUNTER — Encounter: Payer: Self-pay | Admitting: Infectious Disease

## 2020-05-24 DIAGNOSIS — M19021 Primary osteoarthritis, right elbow: Secondary | ICD-10-CM | POA: Diagnosis not present

## 2020-05-24 DIAGNOSIS — I10 Essential (primary) hypertension: Secondary | ICD-10-CM | POA: Diagnosis not present

## 2020-05-24 DIAGNOSIS — M79631 Pain in right forearm: Secondary | ICD-10-CM | POA: Diagnosis not present

## 2020-05-24 DIAGNOSIS — M19011 Primary osteoarthritis, right shoulder: Secondary | ICD-10-CM | POA: Diagnosis not present

## 2020-05-24 DIAGNOSIS — R0781 Pleurodynia: Secondary | ICD-10-CM | POA: Diagnosis not present

## 2020-05-24 DIAGNOSIS — B20 Human immunodeficiency virus [HIV] disease: Secondary | ICD-10-CM | POA: Diagnosis not present

## 2020-05-24 DIAGNOSIS — E119 Type 2 diabetes mellitus without complications: Secondary | ICD-10-CM | POA: Diagnosis not present

## 2020-05-24 DIAGNOSIS — M79621 Pain in right upper arm: Secondary | ICD-10-CM | POA: Diagnosis not present

## 2020-05-24 DIAGNOSIS — R569 Unspecified convulsions: Secondary | ICD-10-CM | POA: Diagnosis not present

## 2020-05-24 DIAGNOSIS — N186 End stage renal disease: Secondary | ICD-10-CM | POA: Diagnosis not present

## 2020-05-24 DIAGNOSIS — K219 Gastro-esophageal reflux disease without esophagitis: Secondary | ICD-10-CM | POA: Diagnosis not present

## 2020-05-24 DIAGNOSIS — S4991XA Unspecified injury of right shoulder and upper arm, initial encounter: Secondary | ICD-10-CM | POA: Diagnosis not present

## 2020-05-26 DIAGNOSIS — Z992 Dependence on renal dialysis: Secondary | ICD-10-CM | POA: Diagnosis not present

## 2020-05-26 DIAGNOSIS — N2581 Secondary hyperparathyroidism of renal origin: Secondary | ICD-10-CM | POA: Diagnosis not present

## 2020-05-26 DIAGNOSIS — N186 End stage renal disease: Secondary | ICD-10-CM | POA: Diagnosis not present

## 2020-05-28 DIAGNOSIS — N2581 Secondary hyperparathyroidism of renal origin: Secondary | ICD-10-CM | POA: Insufficient documentation

## 2020-05-29 DIAGNOSIS — Z992 Dependence on renal dialysis: Secondary | ICD-10-CM | POA: Diagnosis not present

## 2020-05-29 DIAGNOSIS — N186 End stage renal disease: Secondary | ICD-10-CM | POA: Diagnosis not present

## 2020-05-29 DIAGNOSIS — N2581 Secondary hyperparathyroidism of renal origin: Secondary | ICD-10-CM | POA: Diagnosis not present

## 2020-05-29 DIAGNOSIS — Z5329 Procedure and treatment not carried out because of patient's decision for other reasons: Secondary | ICD-10-CM | POA: Diagnosis not present

## 2020-05-31 ENCOUNTER — Encounter (HOSPITAL_COMMUNITY): Payer: Self-pay

## 2020-05-31 ENCOUNTER — Other Ambulatory Visit: Payer: Self-pay

## 2020-05-31 ENCOUNTER — Observation Stay (HOSPITAL_COMMUNITY)
Admission: EM | Admit: 2020-05-31 | Discharge: 2020-06-03 | Disposition: A | Discharge status: Home / Self Care | Payer: Medicare HMO | Attending: Internal Medicine | Admitting: Internal Medicine

## 2020-05-31 DIAGNOSIS — E1169 Type 2 diabetes mellitus with other specified complication: Secondary | ICD-10-CM

## 2020-05-31 DIAGNOSIS — Z20822 Contact with and (suspected) exposure to covid-19: Secondary | ICD-10-CM | POA: Diagnosis not present

## 2020-05-31 DIAGNOSIS — E1122 Type 2 diabetes mellitus with diabetic chronic kidney disease: Secondary | ICD-10-CM | POA: Diagnosis not present

## 2020-05-31 DIAGNOSIS — I10 Essential (primary) hypertension: Secondary | ICD-10-CM | POA: Diagnosis not present

## 2020-05-31 DIAGNOSIS — R112 Nausea with vomiting, unspecified: Secondary | ICD-10-CM | POA: Diagnosis not present

## 2020-05-31 DIAGNOSIS — R1111 Vomiting without nausea: Secondary | ICD-10-CM | POA: Diagnosis not present

## 2020-05-31 DIAGNOSIS — N186 End stage renal disease: Secondary | ICD-10-CM | POA: Insufficient documentation

## 2020-05-31 DIAGNOSIS — I160 Hypertensive urgency: Secondary | ICD-10-CM | POA: Diagnosis present

## 2020-05-31 DIAGNOSIS — Z992 Dependence on renal dialysis: Secondary | ICD-10-CM | POA: Insufficient documentation

## 2020-05-31 DIAGNOSIS — Z8673 Personal history of transient ischemic attack (TIA), and cerebral infarction without residual deficits: Secondary | ICD-10-CM | POA: Diagnosis not present

## 2020-05-31 DIAGNOSIS — Z7901 Long term (current) use of anticoagulants: Secondary | ICD-10-CM | POA: Insufficient documentation

## 2020-05-31 DIAGNOSIS — E119 Type 2 diabetes mellitus without complications: Secondary | ICD-10-CM | POA: Diagnosis not present

## 2020-05-31 DIAGNOSIS — I120 Hypertensive chronic kidney disease with stage 5 chronic kidney disease or end stage renal disease: Secondary | ICD-10-CM | POA: Diagnosis not present

## 2020-05-31 DIAGNOSIS — R55 Syncope and collapse: Principal | ICD-10-CM | POA: Insufficient documentation

## 2020-05-31 DIAGNOSIS — I739 Peripheral vascular disease, unspecified: Secondary | ICD-10-CM | POA: Diagnosis not present

## 2020-05-31 DIAGNOSIS — R110 Nausea: Secondary | ICD-10-CM | POA: Diagnosis not present

## 2020-05-31 DIAGNOSIS — B20 Human immunodeficiency virus [HIV] disease: Secondary | ICD-10-CM | POA: Diagnosis not present

## 2020-05-31 DIAGNOSIS — R1110 Vomiting, unspecified: Secondary | ICD-10-CM | POA: Diagnosis not present

## 2020-05-31 DIAGNOSIS — R569 Unspecified convulsions: Secondary | ICD-10-CM

## 2020-05-31 DIAGNOSIS — G319 Degenerative disease of nervous system, unspecified: Secondary | ICD-10-CM | POA: Diagnosis not present

## 2020-05-31 DIAGNOSIS — G40909 Epilepsy, unspecified, not intractable, without status epilepticus: Secondary | ICD-10-CM

## 2020-05-31 DIAGNOSIS — I6930 Unspecified sequelae of cerebral infarction: Secondary | ICD-10-CM

## 2020-05-31 NOTE — ED Triage Notes (Signed)
Pt arrives to ED via [facility] from [facility] w/ c/o n/v. Pt is tues, thurs, sat dialysis pt, missed appt today, received full tx tues. Pt states after vomiting symptoms resolved. Pt denies abdominal pain. Pt HTN w/ EMS but VS otherwise stable.

## 2020-06-01 ENCOUNTER — Encounter (HOSPITAL_COMMUNITY): Payer: Self-pay | Admitting: Emergency Medicine

## 2020-06-01 ENCOUNTER — Emergency Department (HOSPITAL_COMMUNITY): Payer: Medicare HMO

## 2020-06-01 ENCOUNTER — Observation Stay (HOSPITAL_COMMUNITY): Payer: Medicare HMO

## 2020-06-01 DIAGNOSIS — E119 Type 2 diabetes mellitus without complications: Secondary | ICD-10-CM | POA: Diagnosis not present

## 2020-06-01 DIAGNOSIS — N186 End stage renal disease: Secondary | ICD-10-CM

## 2020-06-01 DIAGNOSIS — R1110 Vomiting, unspecified: Secondary | ICD-10-CM | POA: Diagnosis not present

## 2020-06-01 DIAGNOSIS — G319 Degenerative disease of nervous system, unspecified: Secondary | ICD-10-CM | POA: Diagnosis not present

## 2020-06-01 DIAGNOSIS — R569 Unspecified convulsions: Secondary | ICD-10-CM

## 2020-06-01 DIAGNOSIS — Z794 Long term (current) use of insulin: Secondary | ICD-10-CM

## 2020-06-01 DIAGNOSIS — R110 Nausea: Secondary | ICD-10-CM | POA: Diagnosis not present

## 2020-06-01 DIAGNOSIS — I160 Hypertensive urgency: Secondary | ICD-10-CM | POA: Diagnosis not present

## 2020-06-01 DIAGNOSIS — R112 Nausea with vomiting, unspecified: Secondary | ICD-10-CM | POA: Diagnosis not present

## 2020-06-01 DIAGNOSIS — B20 Human immunodeficiency virus [HIV] disease: Secondary | ICD-10-CM | POA: Diagnosis not present

## 2020-06-01 DIAGNOSIS — I739 Peripheral vascular disease, unspecified: Secondary | ICD-10-CM | POA: Diagnosis not present

## 2020-06-01 DIAGNOSIS — R55 Syncope and collapse: Secondary | ICD-10-CM | POA: Diagnosis not present

## 2020-06-01 LAB — HEMOGLOBIN A1C
Hgb A1c MFr Bld: 4.7 % — ABNORMAL LOW (ref 4.8–5.6)
Mean Plasma Glucose: 88.19 mg/dL

## 2020-06-01 LAB — COMPREHENSIVE METABOLIC PANEL
ALT: 12 U/L (ref 0–44)
AST: 16 U/L (ref 15–41)
Albumin: 3.2 g/dL — ABNORMAL LOW (ref 3.5–5.0)
Alkaline Phosphatase: 103 U/L (ref 38–126)
Anion gap: 13 (ref 5–15)
BUN: 36 mg/dL — ABNORMAL HIGH (ref 8–23)
CO2: 26 mmol/L (ref 22–32)
Calcium: 9.3 mg/dL (ref 8.9–10.3)
Chloride: 103 mmol/L (ref 98–111)
Creatinine, Ser: 8.21 mg/dL — ABNORMAL HIGH (ref 0.61–1.24)
GFR calc Af Amer: 7 mL/min — ABNORMAL LOW (ref >60)
GFR calc non Af Amer: 6 mL/min — ABNORMAL LOW (ref >60)
Glucose, Bld: 80 mg/dL (ref 70–99)
Potassium: 4.7 mmol/L (ref 3.5–5.1)
Sodium: 142 mmol/L (ref 135–145)
Total Bilirubin: 0.6 mg/dL (ref 0.3–1.2)
Total Protein: 6.6 g/dL (ref 6.5–8.1)

## 2020-06-01 LAB — CBC
HCT: 34 % — ABNORMAL LOW (ref 39.0–52.0)
Hemoglobin: 10.5 g/dL — ABNORMAL LOW (ref 13.0–17.0)
MCH: 31.3 pg (ref 26.0–34.0)
MCHC: 30.9 g/dL (ref 30.0–36.0)
MCV: 101.5 fL — ABNORMAL HIGH (ref 80.0–100.0)
Platelets: 211 10*3/uL (ref 150–400)
RBC: 3.35 MIL/uL — ABNORMAL LOW (ref 4.22–5.81)
RDW: 15.8 % — ABNORMAL HIGH (ref 11.5–15.5)
WBC: 5.5 10*3/uL (ref 4.0–10.5)
nRBC: 0 % (ref 0.0–0.2)

## 2020-06-01 LAB — I-STAT CHEM 8, ED
BUN: 35 mg/dL — ABNORMAL HIGH (ref 8–23)
Calcium, Ion: 1.12 mmol/L — ABNORMAL LOW (ref 1.15–1.40)
Chloride: 104 mmol/L (ref 98–111)
Creatinine, Ser: 8.5 mg/dL — ABNORMAL HIGH (ref 0.61–1.24)
Glucose, Bld: 78 mg/dL (ref 70–99)
HCT: 33 % — ABNORMAL LOW (ref 39.0–52.0)
Hemoglobin: 11.2 g/dL — ABNORMAL LOW (ref 13.0–17.0)
Potassium: 4.5 mmol/L (ref 3.5–5.1)
Sodium: 141 mmol/L (ref 135–145)
TCO2: 23 mmol/L (ref 22–32)

## 2020-06-01 LAB — CBG MONITORING, ED
Glucose-Capillary: 78 mg/dL (ref 70–99)
Glucose-Capillary: 76 mg/dL (ref 70–99)
Glucose-Capillary: 89 mg/dL (ref 70–99)
Glucose-Capillary: 99 mg/dL (ref 70–99)
Glucose-Capillary: 156 mg/dL — ABNORMAL HIGH (ref 70–99)
Glucose-Capillary: 158 mg/dL — ABNORMAL HIGH (ref 70–99)

## 2020-06-01 LAB — SARS CORONAVIRUS 2 BY RT PCR (HOSPITAL ORDER, PERFORMED IN ~~LOC~~ HOSPITAL LAB): SARS Coronavirus 2: NEGATIVE

## 2020-06-01 LAB — TROPONIN I (HIGH SENSITIVITY): Troponin I (High Sensitivity): 26 ng/L — ABNORMAL HIGH (ref <18)

## 2020-06-01 MED ORDER — HEPARIN SODIUM (PORCINE) 5000 UNIT/ML IJ SOLN
5000.0000 [IU] | Freq: Three times a day (TID) | INTRAMUSCULAR | Status: DC
  Administered 2020-06-01 – 2020-06-03 (×6): 5000 [IU] via SUBCUTANEOUS
  Filled 2020-06-01 (×6): qty 1

## 2020-06-01 MED ORDER — SODIUM CHLORIDE 0.9% FLUSH: 10.0000 mL | INTRAVENOUS | Status: DC | PRN

## 2020-06-01 MED ORDER — INSULIN ASPART 100 UNIT/ML ~~LOC~~ SOLN
0.0000 [IU] | Freq: Three times a day (TID) | SUBCUTANEOUS | Status: DC
  Administered 2020-06-01: 1 [IU] via SUBCUTANEOUS
  Administered 2020-06-02: 2 [IU] via SUBCUTANEOUS
  Administered 2020-06-03: 1 [IU] via SUBCUTANEOUS

## 2020-06-01 MED ORDER — ONDANSETRON HCL 4 MG/2ML IJ SOLN: 4.0000 mg | Freq: Four times a day (QID) | INTRAMUSCULAR | Status: DC | PRN

## 2020-06-01 MED ORDER — LEVETIRACETAM 500 MG PO TABS
500.0000 mg | ORAL_TABLET | Freq: Two times a day (BID) | ORAL | Status: DC
  Administered 2020-06-01 – 2020-06-03 (×5): 500 mg via ORAL
  Filled 2020-06-01 (×5): qty 1

## 2020-06-01 MED ORDER — SODIUM CHLORIDE 0.9 % IV SOLN: 250.0000 mL | INTRAVENOUS | Status: DC | PRN

## 2020-06-01 MED ORDER — LABETALOL HCL 5 MG/ML IV SOLN
10.0000 mg | INTRAVENOUS | Status: DC | PRN
  Filled 2020-06-01: qty 4

## 2020-06-01 MED ORDER — CARVEDILOL 3.125 MG PO TABS
3.1250 mg | ORAL_TABLET | Freq: Two times a day (BID) | ORAL | Status: DC
  Administered 2020-06-01 – 2020-06-03 (×4): 3.125 mg via ORAL
  Filled 2020-06-01 (×4): qty 1

## 2020-06-01 MED ORDER — PANTOPRAZOLE SODIUM 40 MG PO TBEC
40.0000 mg | DELAYED_RELEASE_TABLET | Freq: Every day | ORAL | Status: DC
  Administered 2020-06-01 – 2020-06-03 (×3): 40 mg via ORAL
  Filled 2020-06-01 (×3): qty 1

## 2020-06-01 MED ORDER — SODIUM CHLORIDE 0.9% FLUSH: 3.0000 mL | Freq: Two times a day (BID) | INTRAVENOUS | Status: DC

## 2020-06-01 MED ORDER — SULFAMETHOXAZOLE-TRIMETHOPRIM 800-160 MG PO TABS
1.0000 | ORAL_TABLET | ORAL | Status: DC
  Administered 2020-06-01: 1 via ORAL
  Filled 2020-06-01: qty 1

## 2020-06-01 MED ORDER — HYDRALAZINE HCL 50 MG PO TABS: 50.0000 mg | ORAL_TABLET | Freq: Four times a day (QID) | ORAL | Status: DC | PRN

## 2020-06-01 MED ORDER — CLOPIDOGREL BISULFATE 75 MG PO TABS
75.0000 mg | ORAL_TABLET | Freq: Every day | ORAL | Status: DC
  Administered 2020-06-01 – 2020-06-03 (×3): 75 mg via ORAL
  Filled 2020-06-01 (×3): qty 1

## 2020-06-01 MED ORDER — ONDANSETRON HCL 4 MG PO TABS: 4.0000 mg | ORAL_TABLET | Freq: Four times a day (QID) | ORAL | Status: DC | PRN

## 2020-06-01 MED ORDER — ACETAMINOPHEN 325 MG PO TABS: 650.0000 mg | ORAL_TABLET | Freq: Four times a day (QID) | ORAL | Status: DC | PRN

## 2020-06-01 MED ORDER — CHLORHEXIDINE GLUCONATE CLOTH 2 % EX PADS
6.0000 | MEDICATED_PAD | Freq: Every day | CUTANEOUS | Status: DC
  Administered 2020-06-02: 6 via TOPICAL

## 2020-06-01 MED ORDER — PRAVASTATIN SODIUM 40 MG PO TABS
80.0000 mg | ORAL_TABLET | Freq: Every day | ORAL | Status: DC
  Administered 2020-06-01 – 2020-06-02 (×2): 80 mg via ORAL
  Filled 2020-06-01 (×2): qty 2

## 2020-06-01 MED ORDER — CALCITRIOL 0.25 MCG PO CAPS
0.2500 ug | ORAL_CAPSULE | Freq: Every day | ORAL | Status: DC
  Administered 2020-06-01 – 2020-06-03 (×3): 0.25 ug via ORAL
  Filled 2020-06-01 (×3): qty 1

## 2020-06-01 MED ORDER — BICTEGRAVIR-EMTRICITAB-TENOFOV 50-200-25 MG PO TABS
1.0000 | ORAL_TABLET | Freq: Every day | ORAL | Status: DC
  Administered 2020-06-01 – 2020-06-03 (×3): 1 via ORAL
  Filled 2020-06-01 (×3): qty 1

## 2020-06-01 MED ORDER — SODIUM CHLORIDE 0.9% FLUSH: 3.0000 mL | INTRAVENOUS | Status: DC | PRN

## 2020-06-01 MED ORDER — TAMSULOSIN HCL 0.4 MG PO CAPS
0.4000 mg | ORAL_CAPSULE | Freq: Every day | ORAL | Status: DC
  Administered 2020-06-01 – 2020-06-03 (×3): 0.4 mg via ORAL
  Filled 2020-06-01 (×3): qty 1

## 2020-06-01 MED ORDER — DEXTROSE-NACL 5-0.45 % IV SOLN: INTRAVENOUS | Status: DC

## 2020-06-01 MED ORDER — AMLODIPINE BESYLATE 10 MG PO TABS
10.0000 mg | ORAL_TABLET | Freq: Every day | ORAL | Status: DC
  Administered 2020-06-01 – 2020-06-03 (×3): 10 mg via ORAL
  Filled 2020-06-01: qty 2
  Filled 2020-06-01 (×2): qty 1

## 2020-06-01 MED ORDER — ACETAMINOPHEN 650 MG RE SUPP: 650.0000 mg | Freq: Four times a day (QID) | RECTAL | Status: DC | PRN

## 2020-06-01 MED ORDER — GLUCOSE 40 % PO GEL
1.0000 | Freq: Once | ORAL | Status: AC
  Administered 2020-06-01: 37.5 g via ORAL
  Filled 2020-06-01: qty 1

## 2020-06-01 MED ORDER — ONDANSETRON HCL 4 MG/2ML IJ SOLN
4.0000 mg | Freq: Once | INTRAMUSCULAR | Status: DC
  Filled 2020-06-01: qty 2

## 2020-06-01 NOTE — Progress Notes (Signed)
Present to place PIV.  IV team has attempt yesterday unsuccessfully.  States does to want further attempts tonight

## 2020-06-01 NOTE — ED Notes (Signed)
Meal tray at bedside.  

## 2020-06-01 NOTE — ED Provider Notes (Addendum)
Richard [name]   CSN: [id number] Arrival date & time: 05/31/20  1303     History Chief Complaint  Patient presents with  . Nausea    Richard [name] is a 64 y.o. male.  The history is provided by the patient.  Loss of Consciousness Episode history:  Single Most recent episode:  Today Timing:  Constant Progression:  Resolved Chronicity:  New Context: not blood draw   Relieved by:  Nothing Worsened by:  Nothing Ineffective treatments:  None tried Associated symptoms: nausea and vomiting   Associated symptoms: no chest pain, no diaphoresis, no dizziness, no fever, no focal weakness and no shortness of breath   Associated symptoms comment:  Missed dialysis secondary to this  Vomiting:    Quality:  Stomach contents   Severity:  Moderate   Timing:  Intermittent   Progression:  Resolved Risk factors: no congenital heart disease   Patient with DM and ESRD on dialysis TTS presents with emesis and syncope.  Patient passed out and states girlfriend was there. Patient then had emesis after the event and this caused him to miss dialysis.  No f/c/r.  No CP.  No f/c/r.      Past Medical History:  Diagnosis Date  . Arthritis   . Diabetes mellitus, type II, insulin dependent ([place])   . GERD (gastroesophageal reflux disease)   . Hypertension   . Hypertensive emergency 05/21/2012  . Immune deficiency disorder ([place])   . Migraines   . Neuropathy in diabetes ([place])    bilat feet  . Ruptured lumbar disc   . Stroke [facility]) 2013   residual right sided weakness (arm>leg)    Patient Active Problem List   Diagnosis Date Noted  . Encounter for feeding tube placement   . Weakness   . Creatinine elevation   . Goals of care, counseling/discussion   . Hypertensive urgency   . Meningoencephalitis   . Palliative care by specialist   . ESRD (end stage renal disease) ([place])   . AIDS (acquired immune deficiency syndrome) ([place]) 04/13/2020  .  Encephalopathy 04/13/2020  . Seizure ([place]) 04/10/2020  . History of cerebrovascular accident (CVA) with residual deficit 03/19/2020  . CKD (chronic kidney disease) stage 5, GFR less than 15 ml/min ([facility]) 02/15/2020  . Altered mental status 02/13/2020  . Neuropathy in diabetes ([place])   . Migraines   . Hypertension   . GERD (gastroesophageal reflux disease)   . Diabetes mellitus, type II, insulin dependent ([place])   . Arthritis   . Hyperlipidemia 08/25/2012  . Chronic kidney disease, stage 4 (severe) ([place]) 08/23/2012  . Tobacco abuse 08/23/2012  . Malnutrition of moderate degree ([place]) 08/23/2012  . 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 AV FISTULA PLACEMENT Left 04/25/2020   Procedure: LEFT ARM BRACHIOCEPHALIC ARTERIOVENOUS (AV) FISTULA CREATION;  Surgeon: Rosetta Posner, MD;  Location: [facility];  Service: Vascular;  Laterality: Left;  . IR FLUORO GUIDE CV LINE RIGHT  04/13/2020  . IR US GUIDE VASC ACCESS RIGHT  04/13/2020  . KNEE ARTHROSCOPY Bilateral        Family History  Problem Relation Age of Onset  . Other Mother   . Stroke Father   . Hypertension Father   . Epilepsy Father   . Breast cancer Sister   . Stroke Brother   . Heart attack Brother   . Diabetes Sister  Social History   Tobacco Use  . Smoking status: Current Every Day Smoker    Packs/day: 0.50    Types: Cigarettes  . Smokeless tobacco: Never Used  Vaping Use  . Vaping Use: Never used  Substance Use Topics  . Alcohol use: No  . Drug use: Yes    Types: Marijuana    Home Medications Prior to Admission medications   Medication Sig Start Date End Date Taking? Authorizing Provider  ACCU-CHEK AVIVA PLUS test strip 1 each by Other route See admin instructions. Use 1 test strip to test blood sugar two-three times daily 04/20/18   [provider]  ACCU-CHEK AVIVA PLUS test strip USE 1 strip TO test blood sugar 2  TIMES DAILY TO 3 TIMES DAILY Patient taking differently: 1 each by Other route in the morning, at noon, and at bedtime.  06/10/19   Koberlein, [name], MD  amLODipine (NORVASC) 10 MG tablet TAKE 1 TABLET BY MOUTH DAILY Patient taking differently: Take 10 mg by mouth daily.  03/28/20   Koberlein, [name], MD  bictegravir-emtricitabine-tenofovir AF (BIKTARVY) 50-200-25 MG TABS tablet Take 1 tablet by mouth daily. 05/01/20   Oswald Hillock, MD  blood glucose meter kit and supplies KIT Dispense based on patient and insurance preference. Check blood sugar 4 times a day Patient taking differently: Inject 1 each into the skin See admin instructions. Dispense based on patient and insurance preference. Check blood sugar 4 times a day 06/11/18   [name], MD  calcitRIOL (ROCALTROL) 0.25 MCG capsule Take 1 capsule (0.25 mcg total) by mouth daily. 03/07/20   [name], MD  carvedilol (COREG) 3.125 MG tablet Take 1 tablet (3.125 mg total) by mouth 2 (two) times daily with a meal. 05/07/20 06/06/20  Arrien, Jimmy Picket, MD  Cholecalciferol (VITAMIN D) 50 MCG (2000 UT) tablet Take 1 tablet (2,000 Units total) by mouth in the morning. 03/07/20   [name], MD  clopidogrel (PLAVIX) 75 MG tablet Take 1 tablet (75 mg total) by mouth daily. 03/07/20   [name], MD  hydrocortisone cream 1 % Apply 1 application topically 4 (four) times daily as needed for itching.    [provider]  insulin aspart (NOVOLOG) 100 UNIT/ML injection Inject 0-9 Units into the skin 3 (three) times daily with meals. For glucose 150 to 200 use 2 units, for 201 to 250 use 3 units, for 251 to 300 use 5 units, for 301 to 350 use 7 units for 351 units or above use 9 units. 05/07/20 06/06/20  Arrien, Jimmy Picket, MD  insulin glargine (LANTUS) 100 UNIT/ML injection Inject 0.05 mLs (5 Units total) into the skin at bedtime. 05/07/20 06/06/20  Arrien, Jimmy Picket, MD  Insulin Pen Needle (SURE COMFORT PEN  NEEDLES) 31G X 8 MM MISC Use as directed twice daily with Novolog flex pen Patient taking differently: 1 each by Other route See admin instructions. Use as directed twice daily with Novolog flex pen 02/10/19   Koberlein, [name], MD  levETIRAcetam (KEPPRA) 500 MG tablet Take 1 tablet (500 mg total) by mouth 2 (two) times daily. 03/07/20 04/06/20  [name], MD  Misc. Devices (COMMODE 3-IN-1) MISC Use as directed 02/24/20   [name], MD  Misc. Devices (TRANSFER BENCH) MISC Use as directed 02/24/20   [name], MD  multivitamin (RENA-VIT) TABS tablet Take 1 tablet by mouth at bedtime. 05/07/20 06/06/20  Arrien, Jimmy Picket, MD  NARCAN 4 MG/0.1ML LIQD  nasal spray kit Place 0.4 mg into the nose once.  05/31/18   [provider]  Nutritional Supplements (FEEDING SUPPLEMENT, NEPRO CARB STEADY,) LIQD Take 237 mLs by mouth 3 (three) times daily between meals. 05/07/20 06/06/20  Arrien, Jimmy Picket, MD  pantoprazole (PROTONIX) 40 MG tablet TAKE 1 TABLET BY MOUTH EVERY DAY Patient taking differently: Take 40 mg by mouth daily.  01/23/20   [name], MD  pravastatin (PRAVACHOL) 80 MG tablet Take 1 tablet (80 mg total) by mouth at bedtime. 03/07/20   [name], MD  sildenafil (REVATIO) 20 MG tablet Take 20-100 mg by mouth as needed for erectile dysfunction. 03/28/20   [provider]  sulfamethoxazole-trimethoprim (BACTRIM DS) 800-160 MG tablet Take 1 tablet by mouth 3 (three) times a week. 04/23/20   Little Ishikawa, MD  tamsulosin (FLOMAX) 0.4 MG CAPS capsule TAKE 1 CAPSULE BY MOUTH EVERY DAY Patient taking differently: Take 0.4 mg by mouth daily.  03/28/20   [name], MD  TRUEPLUS INSULIN SYRINGE 31G X 5/16" 1 ML MISC Use pen needle with insulin 2 times daily Patient taking differently: 1 each by Other route in the morning and at bedtime. With insulin 03/31/19   [name], MD    Allergies    Patient has no known  allergies.  Review of Systems   Review of Systems  Constitutional: Negative for diaphoresis and fever.  HENT: Negative for congestion.   Eyes: Negative for visual disturbance.  Respiratory: Negative for shortness of breath.   Cardiovascular: Positive for syncope. Negative for chest pain.  Gastrointestinal: Positive for nausea and vomiting.  Genitourinary: Negative for difficulty urinating.  Musculoskeletal: Negative for arthralgias.  Skin: Negative for rash.  Neurological: Positive for syncope. Negative for dizziness and focal weakness.  Psychiatric/Behavioral: Negative for agitation.  All other systems reviewed and are negative.   Physical Exam Updated Vital Signs BP (!) 198/85 (BP Location: Right Arm)   Pulse 60   Temp 98 F (36.7 C) (Oral)   Resp 18   SpO2 97%   Physical Exam Vitals and nursing [name] reviewed.  Constitutional:      Appearance: Normal appearance. He is not diaphoretic.  HENT:     Head: Normocephalic and atraumatic.     Nose: Nose normal.  Eyes:     Conjunctiva/sclera: Conjunctivae normal.     Pupils: Pupils are equal, round, and reactive to light.  Cardiovascular:     Rate and Rhythm: Normal rate and regular rhythm.     Pulses: Normal pulses.     Heart sounds: Normal heart sounds.  Pulmonary:     Breath sounds: No stridor. Decreased breath sounds present.  Abdominal:     General: Abdomen is flat. Bowel sounds are normal.     Palpations: Abdomen is soft.     Tenderness: There is no abdominal tenderness. There is no guarding.  Musculoskeletal:        General: Normal range of motion.     Cervical back: Normal range of motion and neck supple.  Skin:    General: Skin is warm and dry.     Capillary Refill: Capillary refill takes less than 2 seconds.  Neurological:     General: No focal deficit present.     Mental Status: He is alert and oriented to person, place, and time.     Deep Tendon Reflexes: Reflexes normal.  Psychiatric:        Mood and  Affect: Mood normal.  Behavior: Behavior normal.     ED Results / Procedures / Treatments   Labs (all labs ordered are listed, but only abnormal results are displayed) Results for orders placed or performed during the hospital encounter of 05/31/20  CBC  Result Value Ref Range   WBC 5.5 4.0 - 10.5 K/uL   RBC 3.35 (L) 4.22 - 5.81 MIL/uL   Hemoglobin 10.5 (L) 13.0 - 17.0 g/dL   HCT 34.0 (L) 39 - 52 %   MCV 101.5 (H) 80.0 - 100.0 fL   MCH 31.3 26.0 - 34.0 pg   MCHC 30.9 30.0 - 36.0 g/dL   RDW 15.8 (H) 11.5 - 15.5 %   Platelets 211 150 - 400 K/uL   nRBC 0.0 0.0 - 0.2 %  I-stat chem 8, ED (not at [facility] or [facility])  Result Value Ref Range   Sodium 141 135 - 145 mmol/L   Potassium 4.5 3.5 - 5.1 mmol/L   Chloride 104 98 - 111 mmol/L   BUN 35 (H) 8 - 23 mg/dL   Creatinine, Ser 8.50 (H) 0.61 - 1.24 mg/dL   Glucose, Bld 78 70 - 99 mg/dL   Calcium, Ion 1.12 (L) 1.15 - 1.40 mmol/L   TCO2 23 22 - 32 mmol/L   Hemoglobin 11.2 (L) 13.0 - 17.0 g/dL   HCT 33.0 (L) 39 - 52 %  CBG monitoring, ED  Result Value Ref Range   Glucose-Capillary 78 70 - 99 mg/dL  CBG monitoring, ED  Result Value Ref Range   Glucose-Capillary 76 70 - 99 mg/dL  CBG monitoring, ED  Result Value Ref Range   Glucose-Capillary 89 70 - 99 mg/dL   CT Head Wo Contrast  Result Date: 06/01/2020 CLINICAL DATA:  Syncope EXAM: CT HEAD WITHOUT CONTRAST TECHNIQUE: Contiguous axial images were obtained from the base of the skull through the vertex without intravenous contrast. COMPARISON:  04/12/2020 FINDINGS: Brain: There is atrophy and chronic small vessel disease changes. Old left basal ganglia and corona radiata lacunar infarcts. No acute intracranial abnormality. Specifically, no hemorrhage, hydrocephalus, mass lesion, acute infarction, or significant intracranial injury. Vascular: No hyperdense vessel or unexpected calcification. Skull: No acute calvarial abnormality. Sinuses/Orbits: No acute findings Other: None IMPRESSION:  Old left basal ganglia/corona radiata lacunar infarcts. Atrophy, chronic microvascular disease. No acute intracranial abnormality. Electronically Signed   By: Rolm Baptise M.D.   On: 06/01/2020 02:23   DG Chest Portable 1 View  Result Date: 06/01/2020 CLINICAL DATA:  Nausea and vomiting. EXAM: PORTABLE CHEST 1 VIEW COMPARISON:  None. FINDINGS: The heart size and mediastinal contours are within normal limits. Both lungs are clear. The visualized skeletal structures are unremarkable. Right central venous catheter with tip over the cavoatrial junction region. IMPRESSION: No active disease. Electronically Signed   By: Lucienne Capers M.D.   On: 06/01/2020 01:59    EKG See epic, not crossing over    Radiology CT Head Wo Contrast  Result Date: 06/01/2020 CLINICAL DATA:  Syncope EXAM: CT HEAD WITHOUT CONTRAST TECHNIQUE: Contiguous axial images were obtained from the base of the skull through the vertex without intravenous contrast. COMPARISON:  04/12/2020 FINDINGS: Brain: There is atrophy and chronic small vessel disease changes. Old left basal ganglia and corona radiata lacunar infarcts. No acute intracranial abnormality. Specifically, no hemorrhage, hydrocephalus, mass lesion, acute infarction, or significant intracranial injury. Vascular: No hyperdense vessel or unexpected calcification. Skull: No acute calvarial abnormality. Sinuses/Orbits: No acute findings Other: None IMPRESSION: Old left basal ganglia/corona radiata lacunar infarcts. Atrophy, chronic  microvascular disease. No acute intracranial abnormality. Electronically Signed   By: Rolm Baptise M.D.   On: 06/01/2020 02:23   DG Chest Portable 1 View  Result Date: 06/01/2020 CLINICAL DATA:  Nausea and vomiting. EXAM: PORTABLE CHEST 1 VIEW COMPARISON:  None. FINDINGS: The heart size and mediastinal contours are within normal limits. Both lungs are clear. The visualized skeletal structures are unremarkable. Right central venous catheter with tip  over the cavoatrial junction region. IMPRESSION: No active disease. Electronically Signed   By: Lucienne Capers M.D.   On: 06/01/2020 01:59    Procedures Procedures (including critical care time)  Medications Ordered in ED Medications  sodium chloride flush (NS) 0.9 % injection 10-40 mL (has no administration in time range)  Chlorhexidine Gluconate Cloth 2 % PADS 6 each (has no administration in time range)  dextrose 5 %-0.45 % sodium chloride infusion (has no administration in time range)  ondansetron (ZOFRAN) injection 4 mg (has no administration in time range)  dextrose (GLUTOSE) 40 % oral gel 37.5 g (37.5 g Oral Given 06/01/20 0413)    ED Course  I have reviewed the triage vital signs and the nursing notes.  Pertinent labs & imaging results that were available during my care of the patient were reviewed by me and considered in my medical decision making (see chart for details).    I suspect the symptoms the patient was having may be due to hypoglycemia.    Richard [name] was evaluated in Emergency Department on 06/01/2020 for the symptoms described in the history of present illness. He was evaluated in the context of the global COVID-19 pandemic, which necessitated consideration that the patient might be at risk for infection with the SARS-CoV-2 virus that causes COVID-19. Institutional protocols and algorithms that pertain to the evaluation of patients at risk for COVID-19 are in a state of rapid change based on information released by regulatory bodies including the CDC and federal and state organizations. These policies and algorithms were followed during the patient's care in the ED.  Final Clinical Impression(s) / ED Diagnoses Admit to medicine    Richard Cazier, MD 06/01/20 [id number]    Richard [name], Richard Routzahn, MD 06/01/20 2671

## 2020-06-01 NOTE — H&P (Signed)
History and Physical    Richard [name] JAS:[id number] DOB: 11-24-1955 DOA: 05/31/2020  PCP: [name], MD   Patient coming from: SNF   Chief Complaint: LOC, N/V   HPI: Richard [name] is a 64 y.o. male with medical history significant for insulin-dependent diabetes mellitus, history of CVA, seizure disorder, recently diagnosed AIDS, ESRD on hemodialysis, and hypertension, now presenting to the emergency department after a transient loss of consciousness.  Patient has been at an SNF since his recent prolonged hospitalization, states that he had been doing fairly well with no seizures or other serious issues since the hospital discharge until yesterday when he was seated and experienced a brief loss of consciousness.  He reports nausea with one episode of vomiting and a queasy sensation immediately after the episode, felt a little sleepy, but recovered quickly.  He missed his dialysis session due to this but reports completing dialysis on 05/29/2020.  He denies any chest pain, palpitations, or lightheadedness prior to the episode or after.  He denies any recent fevers, chills, cough, or shortness of breath.  There has not been any leg swelling or tenderness.  ED Course: Upon arrival to the ED, patient is found to be afebrile, saturating well on room air, and hypertensive to 200/90.  EKG features a sinus rhythm with normal rate, normal intervals, and no acute ST-T abnormalities.  Chest x-rays negative for acute cardiopulmonary disease.  Head CT notable for an old lacunar infarction but no acute finding.  Chemistry panel with normal potassium, normal bicarbonate, and BUN 36.  CBC features a macrocytic anemia with hemoglobin 10.5, up from 7.3 last month.  Troponin was slightly elevated at 26.  Glucose was 78.  The patient was given a dose of Zofran and IV dextrose in the ED.  COVID-19 screening test not yet resulted.  Review of Systems:  All other systems reviewed and apart from HPI,  are negative.  Past Medical History:  Diagnosis Date  . Arthritis   . Diabetes mellitus, type II, insulin dependent ([employer])   . GERD (gastroesophageal reflux disease)   . Hypertension   . Hypertensive emergency 05/21/2012  . Immune deficiency disorder ([place])   . Migraines   . Neuropathy in diabetes ([place])    bilat feet  . Ruptured lumbar disc   . Stroke [facility]) 2013   residual right sided weakness (arm>leg)    Past Surgical History:  Procedure Laterality Date  . AV FISTULA PLACEMENT Left 04/25/2020   Procedure: LEFT ARM BRACHIOCEPHALIC ARTERIOVENOUS (AV) FISTULA CREATION;  Surgeon: Rosetta Posner, MD;  Location: [place];  Service: Vascular;  Laterality: Left;  . IR FLUORO GUIDE CV LINE RIGHT  04/13/2020  . IR US GUIDE VASC ACCESS RIGHT  04/13/2020  . KNEE ARTHROSCOPY Bilateral     Social History:   reports that he has been smoking cigarettes. He has been smoking about 0.50 packs per day. He has never used smokeless tobacco. He reports current drug use. Drug: Marijuana. He reports that he does not drink alcohol.  No Known Allergies  Family History  Problem Relation Age of Onset  . Other Mother   . Stroke Father   . Hypertension Father   . Epilepsy Father   . Breast cancer Sister   . Stroke Brother   . Heart attack Brother   . Diabetes Sister      Prior to Admission medications   Medication Sig Start Date End Date Taking? Authorizing Provider  ACCU-CHEK AVIVA PLUS test strip  1 each by Other route See admin instructions. Use 1 test strip to test blood sugar two-three times daily 04/20/18   [provider]  ACCU-CHEK AVIVA PLUS test strip USE 1 strip TO test blood sugar 2 TIMES DAILY TO 3 TIMES DAILY Patient taking differently: 1 each by Other route in the morning, at noon, and at bedtime.  06/10/19   Koberlein, [name], MD  amLODipine (NORVASC) 10 MG tablet TAKE 1 TABLET BY MOUTH DAILY Patient taking differently: Take 10 mg by mouth daily.  03/28/20   Koberlein, [name], MD   bictegravir-emtricitabine-tenofovir AF (BIKTARVY) 50-200-25 MG TABS tablet Take 1 tablet by mouth daily. 05/01/20   Oswald Hillock, MD  blood glucose meter kit and supplies KIT Dispense based on patient and insurance preference. Check blood sugar 4 times a day Patient taking differently: Inject 1 each into the skin See admin instructions. Dispense based on patient and insurance preference. Check blood sugar 4 times a day 06/11/18   [name], MD  calcitRIOL (ROCALTROL) 0.25 MCG capsule Take 1 capsule (0.25 mcg total) by mouth daily. 03/07/20   [name], MD  carvedilol (COREG) 3.125 MG tablet Take 1 tablet (3.125 mg total) by mouth 2 (two) times daily with a meal. 05/07/20 06/06/20  Arrien, Jimmy Picket, MD  Cholecalciferol (VITAMIN D) 50 MCG (2000 UT) tablet Take 1 tablet (2,000 Units total) by mouth in the morning. 03/07/20   [name], MD  clopidogrel (PLAVIX) 75 MG tablet Take 1 tablet (75 mg total) by mouth daily. 03/07/20   [name], MD  hydrocortisone cream 1 % Apply 1 application topically 4 (four) times daily as needed for itching.    [provider]  insulin aspart (NOVOLOG) 100 UNIT/ML injection Inject 0-9 Units into the skin 3 (three) times daily with meals. For glucose 150 to 200 use 2 units, for 201 to 250 use 3 units, for 251 to 300 use 5 units, for 301 to 350 use 7 units for 351 units or above use 9 units. 05/07/20 06/06/20  Arrien, Jimmy Picket, MD  insulin glargine (LANTUS) 100 UNIT/ML injection Inject 0.05 mLs (5 Units total) into the skin at bedtime. 05/07/20 06/06/20  Arrien, Jimmy Picket, MD  Insulin Pen Needle (SURE COMFORT PEN NEEDLES) 31G X 8 MM MISC Use as directed twice daily with Novolog flex pen Patient taking differently: 1 each by Other route See admin instructions. Use as directed twice daily with Novolog flex pen 02/10/19   Koberlein, [name], MD  levETIRAcetam (KEPPRA) 500 MG tablet Take 1 tablet (500 mg total) by mouth 2  (two) times daily. 03/07/20 04/06/20  [name], MD  Misc. Devices (COMMODE 3-IN-1) MISC Use as directed 02/24/20   [name], MD  Misc. Devices (TRANSFER BENCH) MISC Use as directed 02/24/20   [name], MD  multivitamin (RENA-VIT) TABS tablet Take 1 tablet by mouth at bedtime. 05/07/20 06/06/20  Arrien, Jimmy Picket, MD  NARCAN 4 MG/0.1ML LIQD nasal spray kit Place 0.4 mg into the nose once.  05/31/18   [provider]  Nutritional Supplements (FEEDING SUPPLEMENT, NEPRO CARB STEADY,) LIQD Take 237 mLs by mouth 3 (three) times daily between meals. 05/07/20 06/06/20  Arrien, Jimmy Picket, MD  pantoprazole (PROTONIX) 40 MG tablet TAKE 1 TABLET BY MOUTH EVERY DAY Patient taking differently: Take 40 mg by mouth daily.  01/23/20   [name], MD  pravastatin (PRAVACHOL) 80 MG tablet Take 1 tablet (80  mg total) by mouth at bedtime. 03/07/20   [name], MD  sildenafil (REVATIO) 20 MG tablet Take 20-100 mg by mouth as needed for erectile dysfunction. 03/28/20   [provider]  sulfamethoxazole-trimethoprim (BACTRIM DS) 800-160 MG tablet Take 1 tablet by mouth 3 (three) times a week. 04/23/20   Little Ishikawa, MD  tamsulosin (FLOMAX) 0.4 MG CAPS capsule TAKE 1 CAPSULE BY MOUTH EVERY DAY Patient taking differently: Take 0.4 mg by mouth daily.  03/28/20   [name], MD  TRUEPLUS INSULIN SYRINGE 31G X 5/16" 1 ML MISC Use pen needle with insulin 2 times daily Patient taking differently: 1 each by Other route in the morning and at bedtime. With insulin 03/31/19   [name], MD    Physical Exam: Vitals:   05/31/20 2118 06/01/20 0036 06/01/20 0318 06/01/20 0321  BP: (!) 144/94 (!) 198/85 (!) 191/86   Pulse: 68 60 88   Resp: _0 Temp: 98 F (36.7 C) 98 F (36.7 C) 98.4 F (36.9 C)   TempSrc: Oral Oral Oral   SpO2: 97% 97% 100% 100%    Constitutional: NAD, calm  Eyes: PERTLA, lids and conjunctivae normal ENMT: Mucous  membranes are moist. Posterior pharynx clear of any exudate or lesions.   Neck: normal, supple, no masses, no thyromegaly Respiratory:  no wheezing, no crackles. No accessory muscle use.  Cardiovascular: S1 & S2 heard, regular rate and rhythm. No extremity edema.   Abdomen: No distension, no tenderness, soft. Bowel sounds active.  Musculoskeletal: no clubbing / cyanosis. No joint deformity upper and lower extremities.   Skin: no significant rashes, lesions, ulcers. Warm, dry, well-perfused. Neurologic: Mild dysarthria, no aphasia, PERRL. Sensation to light touch intact. Moving all extremities.  Psychiatric: Alert and oriented to person, place, and situation. Very pleasant and cooperative.    Labs and Imaging on Admission: I have personally reviewed following labs and imaging studies  CBC: Recent Labs  Lab 06/01/20 0430 06/01/20 0436  WBC 5.5  --   HGB 10.5* 11.2*  HCT 34.0* 33.0*  MCV 101.5*  --   PLT 211  --    Basic Metabolic Panel: Recent Labs  Lab 06/01/20 0430 06/01/20 0436  NA 142 141  K 4.7 4.5  CL 103 104  CO2 26  --   GLUCOSE 80 78  BUN 36* 35*  CREATININE 8.21* 8.50*  CALCIUM 9.3  --    GFR: CrCl cannot be calculated (Unknown ideal weight.). Liver Function Tests: Recent Labs  Lab 06/01/20 0430  AST 16  ALT 12  ALKPHOS 103  BILITOT 0.6  PROT 6.6  ALBUMIN 3.2*   No results for input(s): LIPASE, AMYLASE in the last 168 hours. No results for input(s): AMMONIA in the last 168 hours. Coagulation Profile: No results for input(s): INR, PROTIME in the last 168 hours. Cardiac Enzymes: No results for input(s): CKTOTAL, CKMB, CKMBINDEX, TROPONINI in the last 168 hours. BNP (last 3 results) Recent Labs    06/22/19 1505  PROBNP 170.0*   HbA1C: No results for input(s): HGBA1C in the last 72 hours. CBG: Recent Labs  Lab 06/01/20 0405 06/01/20 0445 06/01/20 0459  GLUCAP 78 76 89   Lipid Profile: No results for input(s): CHOL, HDL, LDLCALC, TRIG,  CHOLHDL, LDLDIRECT in the last 72 hours. Thyroid Function Tests: No results for input(s): TSH, T4TOTAL, FREET4, T3FREE, THYROIDAB in the last 72 hours. Anemia Panel: No results for input(s): VITAMINB12, FOLATE, FERRITIN, TIBC, IRON, RETICCTPCT in  the last 72 hours. Urine analysis:    Component Value Date/Time   COLORURINE STRAW (A) 04/11/2020 0223   APPEARANCEUR CLEAR 04/11/2020 0223   LABSPEC 1.011 04/11/2020 0223   PHURINE 5.0 04/11/2020 0223   GLUCOSEU 50 (A) 04/11/2020 0223   HGBUR SMALL (A) 04/11/2020 0223   BILIRUBINUR NEGATIVE 04/11/2020 0223   KETONESUR NEGATIVE 04/11/2020 0223   PROTEINUR >=300 (A) 04/11/2020 0223   UROBILINOGEN 0.2 11/29/2014 2003   NITRITE NEGATIVE 04/11/2020 0223   LEUKOCYTESUR NEGATIVE 04/11/2020 0223   Sepsis Labs: _0 (procalcitonin:4,lacticidven:4) )No results found for this or any previous visit (from the past 240 hour(s)).   Radiological Exams on Admission: CT Head Wo Contrast  Result Date: 06/01/2020 CLINICAL DATA:  Syncope EXAM: CT HEAD WITHOUT CONTRAST TECHNIQUE: Contiguous axial images were obtained from the base of the skull through the vertex without intravenous contrast. COMPARISON:  04/12/2020 FINDINGS: Brain: There is atrophy and chronic small vessel disease changes. Old left basal ganglia and corona radiata lacunar infarcts. No acute intracranial abnormality. Specifically, no hemorrhage, hydrocephalus, mass lesion, acute infarction, or significant intracranial injury. Vascular: No hyperdense vessel or unexpected calcification. Skull: No acute calvarial abnormality. Sinuses/Orbits: No acute findings Other: None IMPRESSION: Old left basal ganglia/corona radiata lacunar infarcts. Atrophy, chronic microvascular disease. No acute intracranial abnormality. Electronically Signed   By: Rolm Baptise M.D.   On: 06/01/2020 02:23   DG Chest Portable 1 View  Result Date: 06/01/2020 CLINICAL DATA:  Nausea and vomiting. EXAM: PORTABLE CHEST 1 VIEW  COMPARISON:  None. FINDINGS: The heart size and mediastinal contours are within normal limits. Both lungs are clear. The visualized skeletal structures are unremarkable. Right central venous catheter with tip over the cavoatrial junction region. IMPRESSION: No active disease. Electronically Signed   By: Lucienne Capers M.D.   On: 06/01/2020 01:59    EKG: Independently reviewed. Sinus rhythm, normal rate and intervals, no acute ST-T abnormality.   Assessment/Plan   1. Transient LOC  - Presents following a transient LOC while seated that was followed by vomiting, queasy sensation, and fatigue  - No associated chest pain or palpitations, no acute findings on head CT, no significant abnormality on EKG, and there was no tongue bite or incontinence  - Suspect this was reflex syncope but difficult to exclude seizure  - Continue cardiac monitoring, check orthostatic vitals, check EEG; with essentially normal EKG and echo in May, will hold off on repeating echo for now    2. ESRD  - Patient missed HD on 9/9 due to the LOC, reports completing his session on 9/7, and has severe asymptomatic hypertension in ED but no hyperkalemia, acidosis, uremia, or pulm edema  - SLIV, renally-dose medications, continue calcitriol   3. Seizure disorder  - Continue Keppra, check EEG as above    4. Hypertensive urgency  - BP 200/90 range in ED, asymptomatic  - Continue Norvasc and Coreg with morning doses now, then use labetalol as needed    5. HIV/AIDS  - Diagnosed during recent admission with CD4 39 and VL 426,000  - Continue Biktarvy and prophylactic Bactrim   6. Insulin-dependent DM  - A1c was only 5.2% in May 2021 and glucose low-normal in ED  - Check CBGs, hold Lantus, use low-intensity SSI if needed   7. History of CVA  - No acute findings on head CT, continue Plavix and statin    DVT prophylaxis: sq heparin  Code Status: Full  Family Communication: Discussed with patient  Disposition Plan:   Patient is  from: SNF  Anticipated d/c is to: SNF  Anticipated d/c date is: 06/02/20 Patient currently: Pending EEG, orthostatic vitals, cardiac monitoring Consults called: None  Admission status: Observation     Vianne Bulls, MD [facility]  06/01/2020, 6:03 AM

## 2020-06-01 NOTE — Progress Notes (Signed)
IV TEAM: Dr. Randal Buba got an approval from Nephrologist MD Dr. Moshe Cipro to access [facility] cath. Blue port accessed, heparin/blood wasted,then blood collected for blood works. Connected to a microfilter for medicine administration. [facility] cath care and dressing done. Advised RN to place another consult when port is to be heparin locked.

## 2020-06-01 NOTE — ED Notes (Signed)
Admitting physician approves meal and fluids. 165mL orange juice given.

## 2020-06-01 NOTE — Progress Notes (Signed)
Patient seen and examined, admitted by Dr. Myna Hidalgo this morning.   Briefly 64 year old male with history of IDDM, history of CVA, seizure disorder, recently diagnosed AIDS, ESRD on HD, TTS, hypertension presented after transient loss of consciousness.  Patient was at [facility] since his recent prolonged hospitalization.  Patient reports that he was doing fairly well until a day before the admission when he was seated and experienced a brief loss of consciousness.  He reported nausea with one episode of vomiting, queasy sensation immediately after the episode, felt a little sleepy but recovered quickly.  Patient had last dialysis session on 05/29/2020 and missed his dialysis on 9/9. Patient was admitted for further work-up.  BP 191/86.  BP (!) 171/85 (BP Location: Right Arm)   Pulse 94   Temp 98.2 F (36.8 C) (Oral)   Resp 14   SpO2 100%    Physical Exam  General: Alert and oriented x 3, NAD  Cardiovascular: S1 S2 clear, RRR. No pedal edema b/l  Respiratory: CTAB, no wheezing, rales or rhonchi  Gastrointestinal: Soft, nontender, nondistended, NBS  Ext: no pedal edema bilaterally  Neuro: no new deficits  Musculoskeletal: No cyanosis, clubbing  Skin: No rashes  Psych: Normal affect and demeanor, alert and oriented x3   A/p Transient loss of consciousness,  seizure versus syncope -No associated chest pain, palpitation or shortness of breath, no acute findings on head CT or EKG.  Possible vasovagal syncope however difficult to exclude seizure given his history -Continue telemetry, orthostatic vitals, EEG pending -Patient had a 2D echo in 01/2020, EF 35%, grade 1 diastolic dysfunction, troponin 26.  Seizure disorder Follow EEG, continue Keppra  ESRD on hemodialysis, TTS, -Missed hemodialysis on 9/9, discussed with nephrology, Dr. Jonnie Finner, will follow  Hypertensive urgency BP improving, continue Norvasc, Coreg, labetalol as needed, likely to improve after HD  HIV/AIDS Continue Biktarvy,  prophylactic Bactrim  History of CVA No acute findings on head CT cath, continue Plavix and statin    Carlisle Enke M.D. [facility] 06/01/2020, 11:42 AM

## 2020-06-01 NOTE — Progress Notes (Signed)
IV TEAM: Responded to bedside for a difficult PIV start. Patient is restricted to LUA related to dialysis access. RUA is contracted due to h/o stroke. Noted for limited, poor vasculature for PIV placement. Attempted PIV with ultrasound to RUA, failed. Discussed with Care RN for other possible vascular access options.

## 2020-06-01 NOTE — Progress Notes (Signed)
EEG complete - results pending 

## 2020-06-01 NOTE — Procedures (Signed)
Patient Name: Richard [name]  MRN: [id number]  Epilepsy Attending: Lora Havens  Referring Physician/Provider: Dr. Mitzi Hansen Date: 06/01/2020 Duration: 24.58 minutes  Patient history: 65 year old male with history of seizures who presented with an episode of transient loss of consciousness.  EEG to evaluate for seizures.  Level of alertness: Awake, asleep  AEDs during EEG study: Keppra  Technical aspects: This EEG study was done with scalp electrodes positioned according to the 10-20 International system of electrode placement. Electrical activity was acquired at a sampling rate of 500Hz  and reviewed with a high frequency filter of 70Hz  and a low frequency filter of 1Hz . EEG data were recorded continuously and digitally stored.   Description: The posterior dominant rhythm consists of 8-9 Hz activity of moderate voltage (25-35 uV) seen predominantly in posterior head regions, symmetric and reactive to eye opening and eye closing. Sleep was characterized by vertex waves, sleep spindles (12 to 14 Hz), maximal frontocentral region.  Physiology photic driving was seen during photic stimulation.  Hyperventilation was not performed.     IMPRESSION: This study is within normal limits. No seizures or epileptiform discharges were seen throughout the recording.  Richard [name]

## 2020-06-01 NOTE — Progress Notes (Addendum)
Richard [name] is a 64 Y/O Male with PMH of DMT2, HTN, CVA, seizure disorder recently diagnosed with HIV, recently started hemodialysis at [facility] on T,Th,S. He presented to ED with C/O brief loss of consciousness. He has been admitted per primary as OBSERVATION PATIENT for transient loss of consciousness: Seizure vs Syncope. We will order and manage hemodialysis, will consult formally if patient status upgraded to inpatient.   He missed HD 05/31/2020. Unfortunately unable to have HD today D/T scheduling issues/high patient census. He will have HD tomorrow early AM.   HD orders:  G/O T,Th,S 4 hrs 180NRe 400/800 71 kg 2.0 K /2.25 Ca TDC -Heparin 2000 units IV TIW -Hectorol 2 mcg IV TIW -Mircera 100 mcg IV TIW   [name] [facility] [place] [phone] (Pager)

## 2020-06-01 NOTE — ED Notes (Signed)
Patient CBG 78. Physician notified.

## 2020-06-01 NOTE — ED Notes (Signed)
Sitting on the side of the bed eating lunch

## 2020-06-02 DIAGNOSIS — Z794 Long term (current) use of insulin: Secondary | ICD-10-CM | POA: Diagnosis not present

## 2020-06-02 DIAGNOSIS — E119 Type 2 diabetes mellitus without complications: Secondary | ICD-10-CM | POA: Diagnosis not present

## 2020-06-02 DIAGNOSIS — N186 End stage renal disease: Secondary | ICD-10-CM | POA: Diagnosis not present

## 2020-06-02 DIAGNOSIS — B20 Human immunodeficiency virus [HIV] disease: Secondary | ICD-10-CM | POA: Diagnosis not present

## 2020-06-02 DIAGNOSIS — R55 Syncope and collapse: Secondary | ICD-10-CM | POA: Diagnosis not present

## 2020-06-02 LAB — GLUCOSE, CAPILLARY
Glucose-Capillary: 110 mg/dL — ABNORMAL HIGH (ref 70–99)
Glucose-Capillary: 122 mg/dL — ABNORMAL HIGH (ref 70–99)
Glucose-Capillary: 216 mg/dL — ABNORMAL HIGH (ref 70–99)
Glucose-Capillary: 155 mg/dL — ABNORMAL HIGH (ref 70–99)

## 2020-06-02 LAB — CBC
HCT: 32.5 % — ABNORMAL LOW (ref 39.0–52.0)
Hemoglobin: 10.4 g/dL — ABNORMAL LOW (ref 13.0–17.0)
MCH: 32 pg (ref 26.0–34.0)
MCHC: 32 g/dL (ref 30.0–36.0)
MCV: 100 fL (ref 80.0–100.0)
Platelets: 251 10*3/uL (ref 150–400)
RBC: 3.25 MIL/uL — ABNORMAL LOW (ref 4.22–5.81)
RDW: 15 % (ref 11.5–15.5)
WBC: 6.1 10*3/uL (ref 4.0–10.5)
nRBC: 0 % (ref 0.0–0.2)

## 2020-06-02 LAB — HEPATITIS B SURFACE ANTIGEN: Hepatitis B Surface Ag: NONREACTIVE

## 2020-06-02 LAB — ACID FAST CULTURE WITH REFLEXED SENSITIVITIES (MYCOBACTERIA): Acid Fast Culture: NEGATIVE

## 2020-06-02 LAB — RENAL FUNCTION PANEL
Albumin: 2.9 g/dL — ABNORMAL LOW (ref 3.5–5.0)
Anion gap: 11 (ref 5–15)
BUN: 43 mg/dL — ABNORMAL HIGH (ref 8–23)
CO2: 25 mmol/L (ref 22–32)
Calcium: 8.7 mg/dL — ABNORMAL LOW (ref 8.9–10.3)
Chloride: 100 mmol/L (ref 98–111)
Creatinine, Ser: 8.39 mg/dL — ABNORMAL HIGH (ref 0.61–1.24)
GFR calc Af Amer: 7 mL/min — ABNORMAL LOW (ref >60)
GFR calc non Af Amer: 6 mL/min — ABNORMAL LOW (ref >60)
Glucose, Bld: 99 mg/dL (ref 70–99)
Phosphorus: 5.4 mg/dL — ABNORMAL HIGH (ref 2.5–4.6)
Potassium: 4.9 mmol/L (ref 3.5–5.1)
Sodium: 136 mmol/L (ref 135–145)

## 2020-06-02 LAB — HEPATITIS B CORE ANTIBODY, TOTAL: Hep B Core Total Ab: NONREACTIVE

## 2020-06-02 MED ORDER — CALCITRIOL 0.25 MCG PO CAPS
ORAL_CAPSULE | ORAL | Status: AC
  Administered 2020-06-02: 0.25 ug via ORAL
  Filled 2020-06-02: qty 1

## 2020-06-02 MED ORDER — HEPARIN SODIUM (PORCINE) 1000 UNIT/ML IJ SOLN
INTRAMUSCULAR | Status: AC
  Administered 2020-06-02: 2000 [IU]
  Filled 2020-06-02: qty 2

## 2020-06-02 NOTE — Progress Notes (Signed)
Report given to HD RN. Made the RN aware of no PIV and patient refusing lab work this morning. Asked  them to get blood for lab work from the HD port.

## 2020-06-02 NOTE — Discharge Summary (Addendum)
Physician Discharge Summary  Richard [name] ZOX:[id number] DOB: 1955-12-18 DOA: 05/31/2020  PCP: [name], MD  Admit date: 05/31/2020 Discharge date: 06/03/2020  Admitted From: SNF Disposition:  SNF  Recommendations for Outpatient Follow-up:  1. F/u on BP  Home Health:  none  Discharge Condition:  stable   CODE STATUS:  Full code   Diet recommendation renal, diabetic, heart healthy Consultations:  renal  Procedures/Studies: . EEG   Discharge Diagnoses:  Principal Problem:   Syncope Active Problems:   Diabetes mellitus, type II, insulin dependent (HCC)   History of cerebrovascular accident (CVA) with residual deficit   Seizure ([place])   AIDS (acquired immune deficiency syndrome) ([place])   ESRD (end stage renal disease) ([place])   Hypertensive urgency     Brief Summary: 64 year old male with history of IDDM, history of CVA, seizure disorder, recently diagnosed AIDS, ESRD on HD, TTS, hypertension presented after transient loss of consciousness.  Patient was at [facility] since his recent prolonged hospitalization.  Patient reports that he was doing fairly well until a day before the admission when he was seated and experienced a brief loss of consciousness.  He reported nausea with one episode of vomiting, queasy sensation immediately after the episode, felt a little sleepy but recovered quickly.  Patient had last dialysis session on 05/29/2020 and missed his dialysis on 9/9. Patient was admitted for further work-up.   Hospital Course:  Transient loss of consciousness,  seizure versus syncope - no acute findings on head CT or EKG.    - no abnormalities noted on telemetry  -Patient had a 2D echo in 01/2020, EF 81%, grade 1 diastolic dysfunction, troponin 26. - EEG is unrevealing as well- he is stable to d/c   Seizure disorder - continue Keppra  ESRD on hemodialysis, TTS, -Missed hemodialysis on 9/9- he has been dialyzed today  Hypertensive urgency - home meds include  Norvasc, Coreg, labetalol as needed   HIV/AIDS Continue Biktarvy, prophylactic Bactrim  History of CVA No acute findings on head CT cath, continue Plavix and statin   Discharge Exam: Vitals:   06/03/20 0722 06/03/20 1134  BP: (!) 160/87 (!) 181/90  Pulse: 88 85  Resp: 13 15  Temp: 98.7 F (37.1 C) 98.5 F (36.9 C)  SpO2: 96% 100%   Vitals:   06/02/20 2333 06/03/20 0335 06/03/20 0722 06/03/20 1134  BP: 139/72 (!) 143/59 (!) 160/87 (!) 181/90  Pulse: 99 86 88 85  Resp: _0 Temp: 99 F (37.2 C) 98.6 F (37 C) 98.7 F (37.1 C) 98.5 F (36.9 C)  TempSrc: Oral Oral Oral Oral  SpO2: 99% 97% 96% 100%  Weight:  70.9 kg      General: Pt is alert, awake, not in acute distress Cardiovascular: RRR, S1/S2 +, no rubs, no gallops Respiratory: CTA bilaterally, no wheezing, no rhonchi Abdominal: Soft, NT, ND, bowel sounds + Extremities: no edema, no cyanosis   Discharge Instructions  Discharge Instructions    Increase activity slowly   Complete by: As directed      Allergies as of 06/03/2020   No Known Allergies     Medication List    TAKE these medications   Accu-Chek Aviva Plus test strip Generic drug: glucose blood 1 each by Other route See admin instructions. Use 1 test strip to test blood sugar two-three times daily What changed: Another medication with the same name was changed. Make sure you understand how and when to take each.   Accu-Chek Aviva  Plus test strip Generic drug: glucose blood USE 1 strip TO test blood sugar 2 TIMES DAILY TO 3 TIMES DAILY What changed: See the new instructions.   amLODipine 10 MG tablet Commonly known as: NORVASC TAKE 1 TABLET BY MOUTH DAILY   bictegravir-emtricitabine-tenofovir AF 50-200-25 MG Tabs tablet Commonly known as: BIKTARVY Take 1 tablet by mouth daily.   blood glucose meter kit and supplies Kit Dispense based on patient and insurance preference. Check blood sugar 4 times a day What changed:   how  much to take  how to take this  when to take this   calcitRIOL 0.25 MCG capsule Commonly known as: ROCALTROL Take 1 capsule (0.25 mcg total) by mouth daily.   calcium acetate 667 MG tablet Commonly known as: PHOSLO Take 667 mg by mouth 3 (three) times daily.   carvedilol 3.125 MG tablet Commonly known as: COREG Take 1 tablet (3.125 mg total) by mouth 2 (two) times daily with a meal.   clopidogrel 75 MG tablet Commonly known as: PLAVIX Take 1 tablet (75 mg total) by mouth daily.   Commode 3-In-1 Misc Use as directed   Transfer Bench Misc Use as directed   feeding supplement (NEPRO CARB STEADY) Liqd Take 237 mLs by mouth 3 (three) times daily between meals. What changed: additional instructions   hydrocortisone cream 1 % Apply 1 application topically 4 (four) times daily as needed for itching.   hydrOXYzine 25 MG tablet Commonly known as: ATARAX/VISTARIL Take 25 mg by mouth 3 (three) times daily.   insulin aspart 100 UNIT/ML injection Commonly known as: novoLOG Inject 0-9 Units into the skin 3 (three) times daily with meals. For glucose 150 to 200 use 2 units, for 201 to 250 use 3 units, for 251 to 300 use 5 units, for 301 to 350 use 7 units for 351 units or above use 9 units.   insulin glargine 100 UNIT/ML injection Commonly known as: LANTUS Inject 0.05 mLs (5 Units total) into the skin at bedtime.   Insulin Pen Needle 31G X 8 MM Misc Commonly known as: Sure Comfort Pen Needles Use as directed twice daily with Novolog flex pen What changed:   how much to take  how to take this  when to take this   levETIRAcetam 500 MG tablet Commonly known as: Keppra Take 1 tablet (500 mg total) by mouth 2 (two) times daily.   multivitamin Tabs tablet Take 1 tablet by mouth at bedtime.   Narcan 4 MG/0.1ML Liqd nasal spray kit Generic drug: naloxone Place 0.4 mg into the nose once.   pantoprazole 40 MG tablet Commonly known as: PROTONIX TAKE 1 TABLET BY MOUTH EVERY  DAY   pravastatin 80 MG tablet Commonly known as: PRAVACHOL Take 1 tablet (80 mg total) by mouth at bedtime.   sildenafil 20 MG tablet Commonly known as: REVATIO Take 20-100 mg by mouth as needed for erectile dysfunction.   sulfamethoxazole-trimethoprim 800-160 MG tablet Commonly known as: BACTRIM DS Take 1 tablet by mouth 3 (three) times a week.   tamsulosin 0.4 MG Caps capsule Commonly known as: FLOMAX TAKE 1 CAPSULE BY MOUTH EVERY DAY   TRUEplus Insulin Syringe 31G X 5/16" 1 ML Misc Generic drug: Insulin Syringe-Needle U-100 Use pen needle with insulin 2 times daily What changed: See the new instructions.   Vitamin D 50 MCG (2000 UT) tablet Take 1 tablet (2,000 Units total) by mouth in the morning.   zolpidem 5 MG tablet Commonly known as: AMBIEN Take 5  mg by mouth at bedtime as needed for sleep.       Contact information for after-discharge care    Destination    [facility] .   Service: Skilled Nursing Contact information: [address] [address] [phone]                 No Known Allergies    CT Head Wo Contrast  Result Date: 06/01/2020 CLINICAL DATA:  Syncope EXAM: CT HEAD WITHOUT CONTRAST TECHNIQUE: Contiguous axial images were obtained from the base of the skull through the vertex without intravenous contrast. COMPARISON:  04/12/2020 FINDINGS: Brain: There is atrophy and chronic small vessel disease changes. Old left basal ganglia and corona radiata lacunar infarcts. No acute intracranial abnormality. Specifically, no hemorrhage, hydrocephalus, mass lesion, acute infarction, or significant intracranial injury. Vascular: No hyperdense vessel or unexpected calcification. Skull: No acute calvarial abnormality. Sinuses/Orbits: No acute findings Other: None IMPRESSION: Old left basal ganglia/corona radiata lacunar infarcts. Atrophy, chronic microvascular disease. No acute intracranial abnormality.  Electronically Signed   By: Rolm Baptise M.D.   On: 06/01/2020 02:23   DG Chest Portable 1 View  Result Date: 06/01/2020 CLINICAL DATA:  Nausea and vomiting. EXAM: PORTABLE CHEST 1 VIEW COMPARISON:  None. FINDINGS: The heart size and mediastinal contours are within normal limits. Both lungs are clear. The visualized skeletal structures are unremarkable. Right central venous catheter with tip over the cavoatrial junction region. IMPRESSION: No active disease. Electronically Signed   By: Lucienne Capers M.D.   On: 06/01/2020 01:59   EEG adult  Result Date: 06/01/2020 Lora Havens, MD     06/01/2020 11:39 AM Patient Name: Richard [name] MRN: [id number] Epilepsy Attending: Lora Havens Referring Physician/Provider: Dr. Mitzi Hansen Date: 06/01/2020 Duration: 24.58 minutes Patient history: 64 year old male with history of seizures who presented with an episode of transient loss of consciousness.  EEG to evaluate for seizures. Level of alertness: Awake, asleep AEDs during EEG study: Keppra Technical aspects: This EEG study was done with scalp electrodes positioned according to the 10-20 International system of electrode placement. Electrical activity was acquired at a sampling rate of _0  and reviewed with a high frequency filter of _1  and a low frequency filter of _2 . EEG data were recorded continuously and digitally stored. Description: The posterior dominant rhythm consists of 8-9 Hz activity of moderate voltage (25-35 uV) seen predominantly in posterior head regions, symmetric and reactive to eye opening and eye closing. Sleep was characterized by vertex waves, sleep spindles (12 to 14 Hz), maximal frontocentral region.  Physiology photic driving was seen during photic stimulation.  Hyperventilation was not performed.   IMPRESSION: This study is within normal limits. No seizures or epileptiform discharges were seen throughout the recording. Lora Havens     The results of significant  diagnostics from this hospitalization (including imaging, microbiology, ancillary and laboratory) are listed below for reference.     Microbiology: Recent Results (from the past 240 hour(s))  SARS Coronavirus 2 by RT PCR (hospital order, performed in [facility] hospital lab) Nasopharyngeal Nasopharyngeal Swab     Status: None   Collection Time: 06/01/20  4:33 AM   Specimen: Nasopharyngeal Swab  Result Value Ref Range Status   SARS Coronavirus 2 NEGATIVE NEGATIVE Final    Comment: (NOTE) SARS-CoV-2 target nucleic acids are NOT DETECTED.  The SARS-CoV-2 RNA is generally detectable in upper and lower respiratory specimens during the acute phase of infection. The lowest concentration  of SARS-CoV-2 viral copies this assay can detect is 250 copies / mL. A negative result does not preclude SARS-CoV-2 infection and should not be used as the sole basis for treatment or other patient management decisions.  A negative result may occur with improper specimen collection / handling, submission of specimen other than nasopharyngeal swab, presence of viral mutation(s) within the areas targeted by this assay, and inadequate number of viral copies (<250 copies / mL). A negative result must be combined with clinical observations, patient history, and epidemiological information.  Fact Sheet for Patients:   StrictlyIdeas.no  Fact Sheet for Healthcare Providers: BankingDealers.co.za  This test is not yet approved or  cleared by the [place] FDA and has been authorized for detection and/or diagnosis of SARS-CoV-2 by FDA under an Emergency Use Authorization (EUA).  This EUA will remain in effect (meaning this test can be used) for the duration of the COVID-19 declaration under Section 564(b)(1) of the Act, 21 U.S.C. section 360bbb-3(b)(1), unless the authorization is terminated or revoked sooner.  Performed at [facility], [place] [address], [place], [place] [address]      Labs: BNP (last 3 results) No results for input(s): BNP in the last 8760 hours. Basic Metabolic Panel: Recent Labs  Lab 06/01/20 0430 06/01/20 0436 06/02/20 0835  NA 142 141 136  K 4.7 4.5 4.9  CL 103 104 100  CO2 26  --  25  GLUCOSE 80 78 99  BUN 36* 35* 43*  CREATININE 8.21* 8.50* 8.39*  CALCIUM 9.3  --  8.7*  PHOS  --   --  5.4*   Liver Function Tests: Recent Labs  Lab 06/01/20 0430 06/02/20 0835  AST 16  --   ALT 12  --   ALKPHOS 103  --   BILITOT 0.6  --   PROT 6.6  --   ALBUMIN 3.2* 2.9*   No results for input(s): LIPASE, AMYLASE in the last 168 hours. No results for input(s): AMMONIA in the last 168 hours. CBC: Recent Labs  Lab 06/01/20 0430 06/01/20 0436 06/02/20 0834  WBC 5.5  --  6.1  HGB 10.5* 11.2* 10.4*  HCT 34.0* 33.0* 32.5*  MCV 101.5*  --  100.0  PLT 211  --  251   Cardiac Enzymes: No results for input(s): CKTOTAL, CKMB, CKMBINDEX, TROPONINI in the last 168 hours. BNP: Invalid input(s): POCBNP CBG: Recent Labs  Lab 06/02/20 1251 06/02/20 1542 06/02/20 2120 06/03/20 0615 06/03/20 1131  GLUCAP 122* 216* 155* 112* 155*   D-Dimer No results for input(s): DDIMER in the last 72 hours. Hgb A1c Recent Labs    06/01/20 0541  HGBA1C 4.7*   Lipid Profile No results for input(s): CHOL, HDL, LDLCALC, TRIG, CHOLHDL, LDLDIRECT in the last 72 hours. Thyroid function studies No results for input(s): TSH, T4TOTAL, T3FREE, THYROIDAB in the last 72 hours.  Invalid input(s): FREET3 Anemia work up No results for input(s): VITAMINB12, FOLATE, FERRITIN, TIBC, IRON, RETICCTPCT in the last 72 hours. Urinalysis    Component Value Date/Time   COLORURINE STRAW (A) 04/11/2020 0223   APPEARANCEUR CLEAR 04/11/2020 0223   LABSPEC 1.011 04/11/2020 0223   PHURINE 5.0 04/11/2020 0223   GLUCOSEU 50 (A) 04/11/2020 0223   HGBUR SMALL (A) 04/11/2020 0223   BILIRUBINUR NEGATIVE 04/11/2020 0223   KETONESUR NEGATIVE  04/11/2020 0223   PROTEINUR >=300 (A) 04/11/2020 0223   UROBILINOGEN 0.2 11/29/2014 2003   NITRITE NEGATIVE 04/11/2020 0223   LEUKOCYTESUR NEGATIVE 04/11/2020 0223  Sepsis Labs Invalid input(s): PROCALCITONIN,  WBC,  LACTICIDVEN Microbiology Recent Results (from the past 240 hour(s))  SARS Coronavirus 2 by RT PCR (hospital order, performed in [facility] hospital lab) Nasopharyngeal Nasopharyngeal Swab     Status: None   Collection Time: 06/01/20  4:33 AM   Specimen: Nasopharyngeal Swab  Result Value Ref Range Status   SARS Coronavirus 2 NEGATIVE NEGATIVE Final    Comment: (NOTE) SARS-CoV-2 target nucleic acids are NOT DETECTED.  The SARS-CoV-2 RNA is generally detectable in upper and lower respiratory specimens during the acute phase of infection. The lowest concentration of SARS-CoV-2 viral copies this assay can detect is 250 copies / mL. A negative result does not preclude SARS-CoV-2 infection and should not be used as the sole basis for treatment or other patient management decisions.  A negative result may occur with improper specimen collection / handling, submission of specimen other than nasopharyngeal swab, presence of viral mutation(s) within the areas targeted by this assay, and inadequate number of viral copies (<250 copies / mL). A negative result must be combined with clinical observations, patient history, and epidemiological information.  Fact Sheet for Patients:   StrictlyIdeas.no  Fact Sheet for Healthcare Providers: BankingDealers.co.za  This test is not yet approved or  cleared by the [place] FDA and has been authorized for detection and/or diagnosis of SARS-CoV-2 by FDA under an Emergency Use Authorization (EUA).  This EUA will remain in effect (meaning this test can be used) for the duration of the COVID-19 declaration under Section 564(b)(1) of the Act, 21 U.S.C. section 360bbb-3(b)(1), unless the  authorization is terminated or revoked sooner.  Performed at [facility], [place] [address], [place], [place] [address]      Time coordinating discharge in minutes: 65  SIGNED:   Debbe Odea, MD  [facility] 06/03/2020, 1:31 PM

## 2020-06-02 NOTE — Progress Notes (Signed)
With order to discharge home. Pt. Claimed he got no where to go home and preferred to go back to [facility].  MD aware with order for SNF .

## 2020-06-02 NOTE — TOC Initial Note (Signed)
Transition of Care [facility]) - Initial/Assessment Note    Patient Details  Name: Richard [name] MRN: [id number] Date of Birth: [birth year]  [facility]) CM/SW Contact:    Richard [name], Richard [name] Phone Number: 06/02/2020, 4:49 PM  Clinical Narrative:                 CSW tried to speak with pt about placement but pt hard to wake up. CSW spoke with daughter Richard [name]. CSW spoke with [name] at [facility], adv that pt would be returning, [place] ready to accept pt when he returns.   Expected Discharge Plan: Acute to Acute Transfer Barriers to Discharge: Barriers Resolved   Patient Goals and CMS Choice Patient states their goals for this hospitalization and ongoing recovery are:: Get better soon CMS Medicare.gov Compare Post Acute Care list provided to:: Other (Comment Required) Choice offered to / list presented to : Adult Children  Expected Discharge Plan and Services Expected Discharge Plan: Acute to Acute Transfer In-house Referral: Clinical Social Work     Living arrangements for the past 2 months: [place] Expected Discharge Date: 06/02/20                                    Prior Living Arrangements/Services Living arrangements for the past 2 months: [place] Lives with:: Self Patient language and need for interpreter reviewed:: Yes Do you feel safe going back to the place where you live?: Yes      Need for Family Participation in Patient Care: No (Comment) Care giver support system in place?: Yes (comment)   Criminal Activity/Legal Involvement Pertinent to Current Situation/Hospitalization: No - Comment as needed  Activities of Daily Living      Permission Sought/Granted Permission sought to share information with : Facility Sport and exercise psychologist, Family Supports Permission granted to share information with : No (Unable to speak with pt as he was sleeping)  Share Information with NAME: Richard [name]  Permission granted to share info w AGENCY: SNF  Permission granted to share info w Relationship: Daughter  Permission granted to share info w Contact Information: [phone]  Emotional Assessment Appearance::  (unable to assess pt sleeping) Attitude/Demeanor/Rapport: Unable to Assess Affect (typically observed): Unable to Assess Orientation: : Oriented to Self, Oriented to Place, Oriented to  Time, Oriented to Situation Alcohol / Substance Use: Not Applicable Psych Involvement: No (comment)  Admission diagnosis:  Syncope [R55] Syncope, unspecified syncope type [R55] Nausea and vomiting, intractability of vomiting not specified, unspecified vomiting type [R11.2] Patient Active Problem List   Diagnosis Date Noted  . Syncope 06/01/2020  . Encounter for feeding tube placement   . Weakness   . Creatinine elevation   . Goals of care, counseling/discussion   . Hypertensive urgency   . Meningoencephalitis   . Palliative care by specialist   . ESRD (end stage renal disease) ([place])   . AIDS (acquired immune deficiency syndrome) ([place]) 04/13/2020  . Encephalopathy 04/13/2020  . Seizure ([place]) 04/10/2020  . History of cerebrovascular accident (CVA) with residual deficit 03/19/2020  . CKD (chronic kidney disease) stage 5, GFR less than 15 ml/min (HCC) 02/15/2020  . Altered mental status 02/13/2020  . Neuropathy in diabetes ([place])   . Migraines   . Hypertension   . GERD (gastroesophageal reflux disease)   . Diabetes mellitus, type II, insulin dependent ([place])   . Arthritis   . Hyperlipidemia 08/25/2012  .  Chronic kidney disease, stage 4 (severe) ([place]) 08/23/2012  . Tobacco abuse 08/23/2012  . Malnutrition of moderate degree ([place]) 08/23/2012  . 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 05/21/2012   PCP:  [name], MD Pharmacy:   [id number] [address], [place] [address] Phone:  [phone] Fax: [phone]  [facility] - [place], [place] - [address] [place] [address] Phone: [phone] Fax: [phone]     Social Determinants of Health ([place]) Interventions    Readmission Risk Interventions Readmission Risk Prevention Plan 02/15/2020  Transportation Screening Complete  PCP or Specialist Appt within 5-7 Days Not Complete  Not Complete comments pending disposition  Home Care Screening Complete  Medication Review (RN CM) Referral to Pharmacy  Some recent data might be hidden

## 2020-06-02 NOTE — Progress Notes (Signed)
06/01/2020 @ 2204 patient's BP 202/88 (123). Patient did have prn order for labetelol q2h but patient did not have an IV access. IV team had previously tried using an ultrasound to get an IV but unsuccessful. IV team called again but the patient refused to get stuck tonight.  Dr. Iona Beard present on the floor. This RN talked to the MD and the MD ordered  PO hydralazine. MD aware of the patient being a hard stick. Patient's BP down to 163/73. Will continue to monitor the patient.  Patient refused the lab work too for [employer]. Marland Kitchen

## 2020-06-02 NOTE — Progress Notes (Signed)
[place] to give report, spoke with the nurse '[name]" who claimed  That. they are not accepting pt. Because he signed out against when he was there. MD aware. [facility] ambulance also made aware.

## 2020-06-02 NOTE — Procedures (Signed)
Pt seen on HD, tolerating well, 3 L goal, BP's and HR are wnl.    I was present at this dialysis session, have reviewed the session itself and made  appropriate changes Kelly Splinter MD [place] pager [phone]   06/02/2020, 11:41 AM

## 2020-06-02 NOTE — TOC Transition Note (Signed)
Transition of Care [facility]) - CM/SW Discharge Note   Patient Details  Name: Richard [name] MRN: [id number] Date of Birth: 12-Oct-1955  [facility]) CM/SW Contact:  [address], [place] Phone Number: 06/02/2020, 4:51 PM   Clinical Narrative:    Patient will DC to: [place] Anticipated DC date:  06/02/20 Family notified: [name] Transport by: Corey Harold   Per MD patient ready for DC to . RN to call report prior to discharge ([phone] ask for [name]). RN, patient, patient's family, and facility notified of DC. Discharge Summary and FL2 sent to facility. DC packet on chart. Ambulance transport requested for patient.   CSW will sign off for now as social work intervention is no longer needed. Please consult [place] again if new needs arise.     Final next level of care: Skilled Nursing Facility Barriers to Discharge: Barriers Resolved   Patient Goals and CMS Choice Patient states their goals for this hospitalization and ongoing recovery are:: Get better soon CMS Medicare.gov Compare Post Acute Care list provided to:: Other (Comment Required) Choice offered to / list presented to : Adult Children  Discharge Placement                       Discharge Plan and Services In-house Referral: Clinical Social Work                                   Social Determinants of Health (SDOH) Interventions     Readmission Risk Interventions Readmission Risk Prevention Plan 02/15/2020  Transportation Screening Complete  PCP or Specialist Appt within 5-7 Days Not Complete  Not Complete comments pending disposition  Home Care Screening Complete  Medication Review (RN CM) Referral to Pharmacy  Some recent data might be hidden

## 2020-06-02 NOTE — NC FL2 (Signed)
[place] LEVEL OF CARE SCREENING TOOL     IDENTIFICATION  Patient Name: Richard [name] Birthdate: 14-Jul-1956 Sex: male Admission Date (Current Location): 05/31/2020  [facility] and [place] Number:  Herbalist and Address:  [facility]. [facility] [place] [address], [place], [place] [address]      Provider Number: [id number]  Attending Physician Name and Address:  Debbe Odea, MD  Relative Name and Phone Number:  Dillard Cannon [phone]    Current Level of Care: SNF Recommended Level of Care: [place] Prior Approval Number:    Date Approved/Denied:   PASRR Number: [id number]  Discharge Plan: SNF    Current Diagnoses: Patient Active Problem List   Diagnosis Date Noted  . Syncope 06/01/2020  . Encounter for feeding tube placement   . Weakness   . Creatinine elevation   . Goals of care, counseling/discussion   . Hypertensive urgency   . Meningoencephalitis   . Palliative care by specialist   . ESRD (end stage renal disease) ([place])   . AIDS (acquired immune deficiency syndrome) ([place]) 04/13/2020  . Encephalopathy 04/13/2020  . Seizure ([place]) 04/10/2020  . History of cerebrovascular accident (CVA) with residual deficit 03/19/2020  . CKD (chronic kidney disease) stage 5, GFR less than 15 ml/min (HCC) 02/15/2020  . Altered mental status 02/13/2020  . Neuropathy in diabetes ([place])   . Migraines   . Hypertension   . GERD (gastroesophageal reflux disease)   . Diabetes mellitus, type II, insulin dependent ([place])   . Arthritis   . Hyperlipidemia 08/25/2012  . Chronic kidney disease, stage 4 (severe) ([place]) 08/23/2012  . Tobacco abuse 08/23/2012  . Malnutrition of moderate degree ([place]) 08/23/2012  . 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 05/21/2012    Orientation RESPIRATION BLADDER Height & Weight     Self, Time, Situation, Place  Normal Continent, External catheter Weight: 156 lb 15.5 oz  (71.2 kg) Height:     BEHAVIORAL SYMPTOMS/MOOD NEUROLOGICAL BOWEL NUTRITION STATUS      Continent Diet (See dc summary)  AMBULATORY STATUS COMMUNICATION OF NEEDS Skin   Extensive Assist Verbally Normal                       Personal Care Assistance Level of Assistance  Bathing, Dressing, Feeding Bathing Assistance: Limited assistance Feeding assistance: Independent Dressing Assistance: Independent     Functional Limitations Info  Sight, Hearing, Speech Sight Info: Adequate Hearing Info: Adequate Speech Info: Adequate    SPECIAL CARE FACTORS FREQUENCY  PT (By licensed PT), OT (By licensed OT)     PT Frequency: 5x week OT Frequency: 5x week            Contractures      Additional Factors Info  Code Status, Insulin Sliding Scale Code Status Info: FULL     Insulin Sliding Scale Info: insulin aspart (novoLOG) injection 0-6 Units 3X daily with meals,       Current Medications (06/02/2020):  This is the current hospital active medication list Current Facility-Administered Medications  Medication Dose Route Frequency Provider Last Rate Last Admin  . 0.9 %  sodium chloride infusion  250 mL Intravenous PRN Opyd, Ilene Qua, MD      . acetaminophen (TYLENOL) tablet 650 mg  650 mg Oral Q6H PRN Opyd, Ilene Qua, MD       Or  . acetaminophen (TYLENOL) suppository 650 mg  650 mg Rectal Q6H PRN Opyd, Christia Reading  S, MD      . amLODipine (NORVASC) tablet 10 mg  10 mg Oral Daily Opyd, Ilene Qua, MD   10 mg at 06/02/20 1241  . bictegravir-emtricitabine-tenofovir AF (BIKTARVY) 50-200-25 MG per tablet 1 tablet  1 tablet Oral Daily Opyd, Ilene Qua, MD   1 tablet at 06/02/20 1242  . calcitRIOL (ROCALTROL) capsule 0.25 mcg  0.25 mcg Oral Daily Opyd, Ilene Qua, MD   0.25 mcg at 06/02/20 1242  . carvedilol (COREG) tablet 3.125 mg  3.125 mg Oral BID WC Opyd, Ilene Qua, MD   3.125 mg at 06/02/20 1634  . Chlorhexidine Gluconate Cloth 2 % PADS 6 each  6 each Topical Daily Opyd, Ilene Qua, MD   6  each at 06/02/20 1242  . clopidogrel (PLAVIX) tablet 75 mg  75 mg Oral Daily Opyd, Ilene Qua, MD   75 mg at 06/02/20 1243  . heparin injection 5,000 Units  5,000 Units Subcutaneous Q8H Opyd, Ilene Qua, MD   5,000 Units at 06/02/20 1243  . hydrALAZINE (APRESOLINE) tablet 50 mg  50 mg Oral Q6H PRN Shalhoub, Sherryll Burger, MD      . insulin aspart (novoLOG) injection 0-6 Units  0-6 Units Subcutaneous TID WC Opyd, Ilene Qua, MD   2 Units at 06/02/20 1635  . labetalol (NORMODYNE) injection 10 mg  10 mg Intravenous Q2H PRN Opyd, Ilene Qua, MD      . levETIRAcetam (KEPPRA) tablet 500 mg  500 mg Oral BID Opyd, Ilene Qua, MD   500 mg at 06/02/20 1243  . ondansetron (ZOFRAN) injection 4 mg  4 mg Intravenous Once Opyd, Ilene Qua, MD      . ondansetron (ZOFRAN) tablet 4 mg  4 mg Oral Q6H PRN Opyd, Ilene Qua, MD       Or  . ondansetron (ZOFRAN) injection 4 mg  4 mg Intravenous Q6H PRN Opyd, Ilene Qua, MD      . pantoprazole (PROTONIX) EC tablet 40 mg  40 mg Oral Daily Opyd, Ilene Qua, MD   40 mg at 06/02/20 1241  . pravastatin (PRAVACHOL) tablet 80 mg  80 mg Oral QHS Vianne Bulls, MD   80 mg at 06/01/20 2129  . sodium chloride flush (NS) 0.9 % injection 10-40 mL  10-40 mL Intracatheter PRN Opyd, Ilene Qua, MD      . sodium chloride flush (NS) 0.9 % injection 3 mL  3 mL Intravenous Q12H Opyd, Timothy S, MD      . sodium chloride flush (NS) 0.9 % injection 3 mL  3 mL Intravenous Q12H Opyd, Timothy S, MD      . sodium chloride flush (NS) 0.9 % injection 3 mL  3 mL Intravenous PRN Opyd, Ilene Qua, MD      . sulfamethoxazole-trimethoprim (BACTRIM DS) 800-160 MG per tablet 1 tablet  1 tablet Oral Once per day on Mon Wed Fri Vianne Bulls, MD   1 tablet at 06/01/20 1032  . tamsulosin (FLOMAX) capsule 0.4 mg  0.4 mg Oral Daily Opyd, Ilene Qua, MD   0.4 mg at 06/02/20 1242     Discharge Medications: Please see discharge summary for a list of discharge medications.  Relevant Imaging Results:  Relevant Lab  Results:   Additional Information SSN [id number]  Loreta Ave, LCSWA

## 2020-06-02 NOTE — Progress Notes (Signed)
Back from HD by bed awake and alert. °

## 2020-06-03 DIAGNOSIS — R55 Syncope and collapse: Secondary | ICD-10-CM | POA: Diagnosis not present

## 2020-06-03 DIAGNOSIS — R5381 Other malaise: Secondary | ICD-10-CM | POA: Diagnosis not present

## 2020-06-03 DIAGNOSIS — Z7401 Bed confinement status: Secondary | ICD-10-CM | POA: Diagnosis not present

## 2020-06-03 DIAGNOSIS — M2550 Pain in unspecified joint: Secondary | ICD-10-CM | POA: Diagnosis not present

## 2020-06-03 LAB — HEPATITIS B SURFACE ANTIBODY, QUANTITATIVE: Hep B S AB Quant (Post): <3.1 m[IU]/mL — ABNORMAL LOW (ref >9.9)

## 2020-06-03 LAB — GLUCOSE, CAPILLARY
Glucose-Capillary: 112 mg/dL — ABNORMAL HIGH (ref 70–99)
Glucose-Capillary: 155 mg/dL — ABNORMAL HIGH (ref 70–99)

## 2020-06-03 NOTE — TOC Transition Note (Addendum)
Transition of Care [facility]) - CM/SW Discharge Note   Patient Details  Name: Richard [name] MRN: [id number] Date of Birth: 11-20-1955  Transition of Care [facility]) CM/SW Contact:  Gabrielle Dare Phone Number: 06/03/2020, 1:18 PM   Clinical Narrative:    Patient will Discharge To: [facility] Anticipated DC Date:06/03/2020 Family Notified:yes, daughter Xyon Lukasik [phone] Transport [id number]   Per MD patient ready for DC to [facility] . RN, patient, patient's family, and facility notified of DC. Assessment, Fl2/Pasrr, and Discharge Summary sent to facility. RN given number for report [phone]). DC packet on chart. Ambulance transport requested for patient for 3:00pm   CSW signing off.  Reed Breech [facility] [phone]     Final next level of care: Skilled Nursing Facility Barriers to Discharge: No Barriers Identified   Patient Goals and CMS Choice Patient states their goals for this hospitalization and ongoing recovery are:: Get better soon CMS Medicare.gov Compare Post Acute Care list provided to:: Other (Comment Required) Choice offered to / list presented to : Adult Children  Discharge Placement              Patient chooses bed at:  [facility]) Patient to be transferred to facility by: [facility] Name of family member notified: Margo Aye Patient and family notified of of transfer: 06/03/20  Discharge Plan and Services In-house Referral: Clinical Social Work                                   Social Determinants of Health (SDOH) Interventions     Readmission Risk Interventions Readmission Risk Prevention Plan 02/15/2020  Transportation Screening Complete  PCP or Specialist Appt within 5-7 Days Not Complete  Not Complete comments pending disposition  Home Care Screening Complete  Medication Review (RN CM) Referral to Pharmacy  Some recent data might be hidden

## 2020-06-03 NOTE — Progress Notes (Signed)
Patient refused labwork until dialysis tomorrow

## 2020-06-03 NOTE — Progress Notes (Signed)
[facility]  I discharged this patient yesterday but his SNF declined to take him.   He remains quite stable and I feel he can return to his facility whenever they are able to take him. I have sent a message to the [facility] person today to help facilitate discharge.   Debbe Odea, MD

## 2020-06-04 DIAGNOSIS — F445 Conversion disorder with seizures or convulsions: Secondary | ICD-10-CM | POA: Insufficient documentation

## 2020-06-05 ENCOUNTER — Ambulatory Visit: Payer: Medicare HMO | Admitting: Infectious Disease

## 2020-06-05 DIAGNOSIS — Z992 Dependence on renal dialysis: Secondary | ICD-10-CM | POA: Diagnosis not present

## 2020-06-05 DIAGNOSIS — N2581 Secondary hyperparathyroidism of renal origin: Secondary | ICD-10-CM | POA: Diagnosis not present

## 2020-06-05 DIAGNOSIS — N186 End stage renal disease: Secondary | ICD-10-CM | POA: Diagnosis not present

## 2020-06-06 ENCOUNTER — Encounter (HOSPITAL_COMMUNITY): Payer: Medicare HMO

## 2020-06-06 ENCOUNTER — Ambulatory Visit: Payer: Medicare HMO | Admitting: Infectious Disease

## 2020-06-06 DIAGNOSIS — E119 Type 2 diabetes mellitus without complications: Secondary | ICD-10-CM | POA: Diagnosis not present

## 2020-06-06 DIAGNOSIS — B20 Human immunodeficiency virus [HIV] disease: Secondary | ICD-10-CM | POA: Diagnosis not present

## 2020-06-06 DIAGNOSIS — I10 Essential (primary) hypertension: Secondary | ICD-10-CM | POA: Diagnosis not present

## 2020-06-06 DIAGNOSIS — N186 End stage renal disease: Secondary | ICD-10-CM | POA: Diagnosis not present

## 2020-06-07 DIAGNOSIS — N2581 Secondary hyperparathyroidism of renal origin: Secondary | ICD-10-CM | POA: Diagnosis not present

## 2020-06-07 DIAGNOSIS — N186 End stage renal disease: Secondary | ICD-10-CM | POA: Diagnosis not present

## 2020-06-07 DIAGNOSIS — Z992 Dependence on renal dialysis: Secondary | ICD-10-CM | POA: Diagnosis not present

## 2020-06-09 DIAGNOSIS — Z992 Dependence on renal dialysis: Secondary | ICD-10-CM | POA: Diagnosis not present

## 2020-06-09 DIAGNOSIS — N2581 Secondary hyperparathyroidism of renal origin: Secondary | ICD-10-CM | POA: Diagnosis not present

## 2020-06-09 DIAGNOSIS — N186 End stage renal disease: Secondary | ICD-10-CM | POA: Diagnosis not present

## 2020-06-12 DIAGNOSIS — N2581 Secondary hyperparathyroidism of renal origin: Secondary | ICD-10-CM | POA: Diagnosis not present

## 2020-06-12 DIAGNOSIS — N186 End stage renal disease: Secondary | ICD-10-CM | POA: Diagnosis not present

## 2020-06-12 DIAGNOSIS — Z992 Dependence on renal dialysis: Secondary | ICD-10-CM | POA: Diagnosis not present

## 2020-06-14 DIAGNOSIS — N2581 Secondary hyperparathyroidism of renal origin: Secondary | ICD-10-CM | POA: Diagnosis not present

## 2020-06-14 DIAGNOSIS — N186 End stage renal disease: Secondary | ICD-10-CM | POA: Diagnosis not present

## 2020-06-14 DIAGNOSIS — Z992 Dependence on renal dialysis: Secondary | ICD-10-CM | POA: Diagnosis not present

## 2020-06-16 DIAGNOSIS — N186 End stage renal disease: Secondary | ICD-10-CM | POA: Diagnosis not present

## 2020-06-16 DIAGNOSIS — Z992 Dependence on renal dialysis: Secondary | ICD-10-CM | POA: Diagnosis not present

## 2020-06-16 DIAGNOSIS — N2581 Secondary hyperparathyroidism of renal origin: Secondary | ICD-10-CM | POA: Diagnosis not present

## 2020-06-19 DIAGNOSIS — N2581 Secondary hyperparathyroidism of renal origin: Secondary | ICD-10-CM | POA: Diagnosis not present

## 2020-06-19 DIAGNOSIS — N186 End stage renal disease: Secondary | ICD-10-CM | POA: Diagnosis not present

## 2020-06-19 DIAGNOSIS — Z992 Dependence on renal dialysis: Secondary | ICD-10-CM | POA: Diagnosis not present

## 2020-06-21 DIAGNOSIS — N186 End stage renal disease: Secondary | ICD-10-CM | POA: Diagnosis not present

## 2020-06-21 DIAGNOSIS — Z992 Dependence on renal dialysis: Secondary | ICD-10-CM | POA: Diagnosis not present

## 2020-06-21 DIAGNOSIS — N2581 Secondary hyperparathyroidism of renal origin: Secondary | ICD-10-CM | POA: Diagnosis not present

## 2020-06-21 DIAGNOSIS — E1122 Type 2 diabetes mellitus with diabetic chronic kidney disease: Secondary | ICD-10-CM | POA: Diagnosis not present

## 2020-06-23 DIAGNOSIS — N2581 Secondary hyperparathyroidism of renal origin: Secondary | ICD-10-CM | POA: Diagnosis not present

## 2020-06-23 DIAGNOSIS — Z992 Dependence on renal dialysis: Secondary | ICD-10-CM | POA: Diagnosis not present

## 2020-06-23 DIAGNOSIS — N186 End stage renal disease: Secondary | ICD-10-CM | POA: Diagnosis not present

## 2020-06-26 DIAGNOSIS — N2581 Secondary hyperparathyroidism of renal origin: Secondary | ICD-10-CM | POA: Diagnosis not present

## 2020-06-26 DIAGNOSIS — Z992 Dependence on renal dialysis: Secondary | ICD-10-CM | POA: Diagnosis not present

## 2020-06-26 DIAGNOSIS — N186 End stage renal disease: Secondary | ICD-10-CM | POA: Diagnosis not present

## 2020-06-28 DIAGNOSIS — N186 End stage renal disease: Secondary | ICD-10-CM | POA: Diagnosis not present

## 2020-06-28 DIAGNOSIS — Z992 Dependence on renal dialysis: Secondary | ICD-10-CM | POA: Diagnosis not present

## 2020-06-28 DIAGNOSIS — N2581 Secondary hyperparathyroidism of renal origin: Secondary | ICD-10-CM | POA: Diagnosis not present

## 2020-06-30 DIAGNOSIS — Z992 Dependence on renal dialysis: Secondary | ICD-10-CM | POA: Diagnosis not present

## 2020-06-30 DIAGNOSIS — N2581 Secondary hyperparathyroidism of renal origin: Secondary | ICD-10-CM | POA: Diagnosis not present

## 2020-06-30 DIAGNOSIS — N186 End stage renal disease: Secondary | ICD-10-CM | POA: Diagnosis not present

## 2020-07-03 DIAGNOSIS — N186 End stage renal disease: Secondary | ICD-10-CM | POA: Diagnosis not present

## 2020-07-03 DIAGNOSIS — Z992 Dependence on renal dialysis: Secondary | ICD-10-CM | POA: Diagnosis not present

## 2020-07-03 DIAGNOSIS — N2581 Secondary hyperparathyroidism of renal origin: Secondary | ICD-10-CM | POA: Diagnosis not present

## 2020-07-05 DIAGNOSIS — I10 Essential (primary) hypertension: Secondary | ICD-10-CM | POA: Diagnosis not present

## 2020-07-05 DIAGNOSIS — N2581 Secondary hyperparathyroidism of renal origin: Secondary | ICD-10-CM | POA: Diagnosis not present

## 2020-07-05 DIAGNOSIS — N186 End stage renal disease: Secondary | ICD-10-CM | POA: Diagnosis not present

## 2020-07-05 DIAGNOSIS — B20 Human immunodeficiency virus [HIV] disease: Secondary | ICD-10-CM | POA: Diagnosis not present

## 2020-07-05 DIAGNOSIS — E119 Type 2 diabetes mellitus without complications: Secondary | ICD-10-CM | POA: Diagnosis not present

## 2020-07-05 DIAGNOSIS — Z992 Dependence on renal dialysis: Secondary | ICD-10-CM | POA: Diagnosis not present

## 2020-07-06 DIAGNOSIS — N186 End stage renal disease: Secondary | ICD-10-CM | POA: Diagnosis not present

## 2020-07-06 DIAGNOSIS — E785 Hyperlipidemia, unspecified: Secondary | ICD-10-CM | POA: Diagnosis not present

## 2020-07-06 DIAGNOSIS — E119 Type 2 diabetes mellitus without complications: Secondary | ICD-10-CM | POA: Diagnosis not present

## 2020-07-06 DIAGNOSIS — B20 Human immunodeficiency virus [HIV] disease: Secondary | ICD-10-CM | POA: Diagnosis not present

## 2020-07-06 DIAGNOSIS — I10 Essential (primary) hypertension: Secondary | ICD-10-CM | POA: Diagnosis not present

## 2020-07-10 DIAGNOSIS — N2581 Secondary hyperparathyroidism of renal origin: Secondary | ICD-10-CM | POA: Diagnosis not present

## 2020-07-10 DIAGNOSIS — N186 End stage renal disease: Secondary | ICD-10-CM | POA: Diagnosis not present

## 2020-07-10 DIAGNOSIS — Z992 Dependence on renal dialysis: Secondary | ICD-10-CM | POA: Diagnosis not present

## 2020-07-12 DIAGNOSIS — Z992 Dependence on renal dialysis: Secondary | ICD-10-CM | POA: Diagnosis not present

## 2020-07-12 DIAGNOSIS — N186 End stage renal disease: Secondary | ICD-10-CM | POA: Diagnosis not present

## 2020-07-12 DIAGNOSIS — N2581 Secondary hyperparathyroidism of renal origin: Secondary | ICD-10-CM | POA: Diagnosis not present

## 2020-07-17 DIAGNOSIS — N2581 Secondary hyperparathyroidism of renal origin: Secondary | ICD-10-CM | POA: Diagnosis not present

## 2020-07-17 DIAGNOSIS — N186 End stage renal disease: Secondary | ICD-10-CM | POA: Diagnosis not present

## 2020-07-17 DIAGNOSIS — Z992 Dependence on renal dialysis: Secondary | ICD-10-CM | POA: Diagnosis not present

## 2020-07-21 DIAGNOSIS — N186 End stage renal disease: Secondary | ICD-10-CM | POA: Diagnosis not present

## 2020-07-21 DIAGNOSIS — Z992 Dependence on renal dialysis: Secondary | ICD-10-CM | POA: Diagnosis not present

## 2020-07-21 DIAGNOSIS — N2581 Secondary hyperparathyroidism of renal origin: Secondary | ICD-10-CM | POA: Diagnosis not present

## 2020-07-22 DIAGNOSIS — E1122 Type 2 diabetes mellitus with diabetic chronic kidney disease: Secondary | ICD-10-CM | POA: Diagnosis not present

## 2020-07-22 DIAGNOSIS — Z992 Dependence on renal dialysis: Secondary | ICD-10-CM | POA: Diagnosis not present

## 2020-07-22 DIAGNOSIS — N186 End stage renal disease: Secondary | ICD-10-CM | POA: Diagnosis not present

## 2020-07-24 DIAGNOSIS — Z992 Dependence on renal dialysis: Secondary | ICD-10-CM | POA: Diagnosis not present

## 2020-07-24 DIAGNOSIS — N2581 Secondary hyperparathyroidism of renal origin: Secondary | ICD-10-CM | POA: Diagnosis not present

## 2020-07-24 DIAGNOSIS — N186 End stage renal disease: Secondary | ICD-10-CM | POA: Diagnosis not present

## 2020-07-26 ENCOUNTER — Other Ambulatory Visit: Payer: Self-pay

## 2020-07-26 DIAGNOSIS — N186 End stage renal disease: Secondary | ICD-10-CM | POA: Diagnosis not present

## 2020-07-26 DIAGNOSIS — N184 Chronic kidney disease, stage 4 (severe): Secondary | ICD-10-CM

## 2020-07-26 DIAGNOSIS — Z992 Dependence on renal dialysis: Secondary | ICD-10-CM | POA: Diagnosis not present

## 2020-07-26 DIAGNOSIS — N2581 Secondary hyperparathyroidism of renal origin: Secondary | ICD-10-CM | POA: Diagnosis not present

## 2020-07-31 DIAGNOSIS — Z992 Dependence on renal dialysis: Secondary | ICD-10-CM | POA: Diagnosis not present

## 2020-07-31 DIAGNOSIS — N186 End stage renal disease: Secondary | ICD-10-CM | POA: Diagnosis not present

## 2020-07-31 DIAGNOSIS — N2581 Secondary hyperparathyroidism of renal origin: Secondary | ICD-10-CM | POA: Diagnosis not present

## 2020-08-01 DIAGNOSIS — B20 Human immunodeficiency virus [HIV] disease: Secondary | ICD-10-CM | POA: Diagnosis not present

## 2020-08-01 DIAGNOSIS — N529 Male erectile dysfunction, unspecified: Secondary | ICD-10-CM | POA: Diagnosis not present

## 2020-08-02 DIAGNOSIS — N2581 Secondary hyperparathyroidism of renal origin: Secondary | ICD-10-CM | POA: Diagnosis not present

## 2020-08-02 DIAGNOSIS — N186 End stage renal disease: Secondary | ICD-10-CM | POA: Diagnosis not present

## 2020-08-02 DIAGNOSIS — Z992 Dependence on renal dialysis: Secondary | ICD-10-CM | POA: Diagnosis not present

## 2020-08-03 ENCOUNTER — Ambulatory Visit (INDEPENDENT_AMBULATORY_CARE_PROVIDER_SITE_OTHER): Payer: Self-pay | Admitting: Physician Assistant

## 2020-08-03 ENCOUNTER — Other Ambulatory Visit: Payer: Self-pay

## 2020-08-03 ENCOUNTER — Ambulatory Visit (HOSPITAL_COMMUNITY)
Admission: RE | Admit: 2020-08-03 | Discharge: 2020-08-03 | Disposition: A | Discharge status: Home / Self Care | Payer: Medicare HMO | Source: Ambulatory Visit | Attending: Physician Assistant | Admitting: Physician Assistant

## 2020-08-03 VITALS — BP 175/112 | HR 78 | Temp 98.0°F | Resp 18 | Ht 72.0 in | Wt 158.0 lb

## 2020-08-03 DIAGNOSIS — N184 Chronic kidney disease, stage 4 (severe): Secondary | ICD-10-CM

## 2020-08-03 DIAGNOSIS — N186 End stage renal disease: Secondary | ICD-10-CM

## 2020-08-03 DIAGNOSIS — Z992 Dependence on renal dialysis: Secondary | ICD-10-CM

## 2020-08-03 NOTE — Progress Notes (Deleted)
POST OPERATIVE OFFICE NOTE    CC:  F/u for surgery  HPI:  This is a 64 y.o. male who is s/p *** on *** by Dr. Marland Kitchen    Pt returns today for follow up.  ***  No Known Allergies  Current Outpatient Medications  Medication Sig Dispense Refill  . ACCU-CHEK AVIVA PLUS test strip 1 each by Other route See admin instructions. Use 1 test strip to test blood sugar two-three times daily  1  . ACCU-CHEK AVIVA PLUS test strip USE 1 strip TO test blood sugar 2 TIMES DAILY TO 3 TIMES DAILY (Patient taking differently: 1 each by Other route in the morning, at noon, and at bedtime. ) 100 strip 1  . amLODipine (NORVASC) 10 MG tablet TAKE 1 TABLET BY MOUTH DAILY (Patient taking differently: Take 10 mg by mouth daily. ) 30 tablet 0  . bictegravir-emtricitabine-tenofovir AF (BIKTARVY) 50-200-25 MG TABS tablet Take 1 tablet by mouth daily. 30 tablet 0  . blood glucose meter kit and supplies KIT Dispense based on patient and insurance preference. Check blood sugar 4 times a day (Patient taking differently: Inject 1 each into the skin See admin instructions. Dispense based on patient and insurance preference. Check blood sugar 4 times a day) 1 each 0  . calcitRIOL (ROCALTROL) 0.25 MCG capsule Take 1 capsule (0.25 mcg total) by mouth daily. 30 capsule 0  . calcium acetate (PHOSLO) 667 MG tablet Take 667 mg by mouth 3 (three) times daily.    . Cholecalciferol (VITAMIN D) 50 MCG (2000 UT) tablet Take 1 tablet (2,000 Units total) by mouth in the morning. 30 tablet 0  . clopidogrel (PLAVIX) 75 MG tablet Take 1 tablet (75 mg total) by mouth daily. 90 tablet 0  . hydrocortisone cream 1 % Apply 1 application topically 4 (four) times daily as needed for itching.    . hydrOXYzine (ATARAX/VISTARIL) 25 MG tablet Take 25 mg by mouth 3 (three) times daily.    . Insulin Pen Needle (SURE COMFORT PEN NEEDLES) 31G X 8 MM MISC Use as directed twice daily with Novolog flex pen (Patient taking differently: 1 each by Other route See  admin instructions. Use as directed twice daily with Novolog flex pen) 100 each 3  . Misc. Devices (COMMODE 3-IN-1) MISC Use as directed 1 each 0  . Misc. Devices (TRANSFER BENCH) MISC Use as directed 1 each 0  . NARCAN 4 MG/0.1ML LIQD nasal spray kit Place 0.4 mg into the nose once.     . pantoprazole (PROTONIX) 40 MG tablet TAKE 1 TABLET BY MOUTH EVERY DAY (Patient taking differently: Take 40 mg by mouth daily. ) 90 tablet 0  . pravastatin (PRAVACHOL) 80 MG tablet Take 1 tablet (80 mg total) by mouth at bedtime. 90 tablet 0  . sildenafil (REVATIO) 20 MG tablet Take 20-100 mg by mouth as needed for erectile dysfunction.    . sulfamethoxazole-trimethoprim (BACTRIM DS) 800-160 MG tablet Take 1 tablet by mouth 3 (three) times a week. 30 tablet 0  . tamsulosin (FLOMAX) 0.4 MG CAPS capsule TAKE 1 CAPSULE BY MOUTH EVERY DAY (Patient taking differently: Take 0.4 mg by mouth daily. ) 30 capsule 0  . TRUEPLUS INSULIN SYRINGE 31G X 5/16" 1 ML MISC Use pen needle with insulin 2 times daily (Patient taking differently: 1 each by Other route in the morning and at bedtime. With insulin) 100 each 1  . zolpidem (AMBIEN) 5 MG tablet Take 5 mg by mouth at bedtime as needed for  sleep.    . carvedilol (COREG) 3.125 MG tablet Take 1 tablet (3.125 mg total) by mouth 2 (two) times daily with a meal. 60 tablet 0  . insulin aspart (NOVOLOG) 100 UNIT/ML injection Inject 0-9 Units into the skin 3 (three) times daily with meals. For glucose 150 to 200 use 2 units, for 201 to 250 use 3 units, for 251 to 300 use 5 units, for 301 to 350 use 7 units for 351 units or above use 9 units. 8.1 mL 0  . insulin glargine (LANTUS) 100 UNIT/ML injection Inject 0.05 mLs (5 Units total) into the skin at bedtime. 1.5 mL 0  . levETIRAcetam (KEPPRA) 500 MG tablet Take 1 tablet (500 mg total) by mouth 2 (two) times daily. 60 tablet 0   No current facility-administered medications for this visit.     ROS:  See HPI  Physical  Exam:  ***  Incision:  *** Extremities:  *** Neuro: *** Abdomen:  ***  Assessment/Plan:  This is a 64 y.o. male who is s/p: ***  -***  *** [facility] [phone]  Clinic MD:  ***

## 2020-08-03 NOTE — Progress Notes (Signed)
POST OPERATIVE OFFICE NOTE    CC:  F/u for surgery  HPI:  This is a 64 y.o. male who is s/p left BC AV fistula creation on 04/25/2020 by Dr. Donnetta Hutching.    Pt returns today for follow up.  He denise pain, loss of motor and loss of sensation in the left UE. He is currently on HD via right TDC placed by IR.     No Known Allergies  Current Outpatient Medications  Medication Sig Dispense Refill  . ACCU-CHEK AVIVA PLUS test strip 1 each by Other route See admin instructions. Use 1 test strip to test blood sugar two-three times daily  1  . ACCU-CHEK AVIVA PLUS test strip USE 1 strip TO test blood sugar 2 TIMES DAILY TO 3 TIMES DAILY (Patient taking differently: 1 each by Other route in the morning, at noon, and at bedtime. ) 100 strip 1  . amLODipine (NORVASC) 10 MG tablet TAKE 1 TABLET BY MOUTH DAILY (Patient taking differently: Take 10 mg by mouth daily. ) 30 tablet 0  . bictegravir-emtricitabine-tenofovir AF (BIKTARVY) 50-200-25 MG TABS tablet Take 1 tablet by mouth daily. 30 tablet 0  . blood glucose meter kit and supplies KIT Dispense based on patient and insurance preference. Check blood sugar 4 times a day (Patient taking differently: Inject 1 each into the skin See admin instructions. Dispense based on patient and insurance preference. Check blood sugar 4 times a day) 1 each 0  . calcitRIOL (ROCALTROL) 0.25 MCG capsule Take 1 capsule (0.25 mcg total) by mouth daily. 30 capsule 0  . calcium acetate (PHOSLO) 667 MG tablet Take 667 mg by mouth 3 (three) times daily.    . Cholecalciferol (VITAMIN D) 50 MCG (2000 UT) tablet Take 1 tablet (2,000 Units total) by mouth in the morning. 30 tablet 0  . clopidogrel (PLAVIX) 75 MG tablet Take 1 tablet (75 mg total) by mouth daily. 90 tablet 0  . hydrocortisone cream 1 % Apply 1 application topically 4 (four) times daily as needed for itching.    . hydrOXYzine (ATARAX/VISTARIL) 25 MG tablet Take 25 mg by mouth 3 (three) times daily.    . Insulin Pen Needle  (SURE COMFORT PEN NEEDLES) 31G X 8 MM MISC Use as directed twice daily with Novolog flex pen (Patient taking differently: 1 each by Other route See admin instructions. Use as directed twice daily with Novolog flex pen) 100 each 3  . Misc. Devices (COMMODE 3-IN-1) MISC Use as directed 1 each 0  . Misc. Devices (TRANSFER BENCH) MISC Use as directed 1 each 0  . NARCAN 4 MG/0.1ML LIQD nasal spray kit Place 0.4 mg into the nose once.     . pantoprazole (PROTONIX) 40 MG tablet TAKE 1 TABLET BY MOUTH EVERY DAY (Patient taking differently: Take 40 mg by mouth daily. ) 90 tablet 0  . pravastatin (PRAVACHOL) 80 MG tablet Take 1 tablet (80 mg total) by mouth at bedtime. 90 tablet 0  . sildenafil (REVATIO) 20 MG tablet Take 20-100 mg by mouth as needed for erectile dysfunction.    . sulfamethoxazole-trimethoprim (BACTRIM DS) 800-160 MG tablet Take 1 tablet by mouth 3 (three) times a week. 30 tablet 0  . tamsulosin (FLOMAX) 0.4 MG CAPS capsule TAKE 1 CAPSULE BY MOUTH EVERY DAY (Patient taking differently: Take 0.4 mg by mouth daily. ) 30 capsule 0  . TRUEPLUS INSULIN SYRINGE 31G X 5/16" 1 ML MISC Use pen needle with insulin 2 times daily (Patient taking differently: 1 each  by Other route in the morning and at bedtime. With insulin) 100 each 1  . zolpidem (AMBIEN) 5 MG tablet Take 5 mg by mouth at bedtime as needed for sleep.    . carvedilol (COREG) 3.125 MG tablet Take 1 tablet (3.125 mg total) by mouth 2 (two) times daily with a meal. 60 tablet 0  . insulin aspart (NOVOLOG) 100 UNIT/ML injection Inject 0-9 Units into the skin 3 (three) times daily with meals. For glucose 150 to 200 use 2 units, for 201 to 250 use 3 units, for 251 to 300 use 5 units, for 301 to 350 use 7 units for 351 units or above use 9 units. 8.1 mL 0  . insulin glargine (LANTUS) 100 UNIT/ML injection Inject 0.05 mLs (5 Units total) into the skin at bedtime. 1.5 mL 0  . levETIRAcetam (KEPPRA) 500 MG tablet Take 1 tablet (500 mg total) by mouth 2  (two) times daily. 60 tablet 0   No current facility-administered medications for this visit.     ROS:  See HPI  Physical Exam:     Findings:  +--------------------+----------+-----------------+--------+  AVF         PSV (cm/s)Flow Vol (mL/min)Comments  +--------------------+----------+-----------------+--------+  Native artery inflow  199     1273          +--------------------+----------+-----------------+--------+  AVF Anastomosis     550                 +--------------------+----------+-----------------+--------+     +------------+----------+-------------+----------+--------------+  OUTFLOW VEINPSV (cm/s)Diameter (cm)Depth (cm)  Describe    +------------+----------+-------------+----------+--------------+  Shoulder    108    0.59     0.50           +------------+----------+-------------+----------+--------------+  Prox UA   141 / 644  0.61 / 0.53   0.37  Retained valve  +------------+----------+-------------+----------+--------------+  Mid UA     362    0.65     0.28           +------------+----------+-------------+----------+--------------+  Dist UA     145    0.63     0.41           +------------+----------+-------------+----------+--------------+        Summary:  Patent arteriovenous fistula with increase of velocity in the proximal arm  outflow vein at area of narrowing due to retained valves..  Incision:  Well healed Extremities:  Palpable radial pulse, palpable thrill in fistula, grip 5/5 and sensation intact. Lungs: non labored breathing   Assessment/Plan:  This is a 64 y.o. male who is s/p:Left BC AV fistula creation on 04/24/2020 by Dr. Donnetta Hutching  The fistula may be accessed Once it is running well the [facility] may be removed.  He will f/u as needed in the future.  -  Roxy Horseman PA-C [facility] [phone]  Clinic MD:  Donzetta Matters

## 2020-08-04 DIAGNOSIS — R569 Unspecified convulsions: Secondary | ICD-10-CM | POA: Diagnosis not present

## 2020-08-06 ENCOUNTER — Other Ambulatory Visit: Payer: Self-pay | Admitting: Family Medicine

## 2020-08-06 DIAGNOSIS — L309 Dermatitis, unspecified: Secondary | ICD-10-CM

## 2020-08-07 DIAGNOSIS — N2581 Secondary hyperparathyroidism of renal origin: Secondary | ICD-10-CM | POA: Diagnosis not present

## 2020-08-07 DIAGNOSIS — N186 End stage renal disease: Secondary | ICD-10-CM | POA: Diagnosis not present

## 2020-08-07 DIAGNOSIS — Z992 Dependence on renal dialysis: Secondary | ICD-10-CM | POA: Diagnosis not present

## 2020-08-07 NOTE — Telephone Encounter (Signed)
Can you see what he is using this for? I haven't seen him in a long time and we were using this for rash that worsened while hospitalized and was thought to be secondary to HIV. Not sure this ointment is appropriate any longer.

## 2020-08-08 ENCOUNTER — Emergency Department (HOSPITAL_COMMUNITY): Payer: No Typology Code available for payment source

## 2020-08-08 ENCOUNTER — Emergency Department (HOSPITAL_COMMUNITY)
Admission: EM | Admit: 2020-08-08 | Discharge: 2020-08-09 | Disposition: A | Discharge status: Home / Self Care | Payer: No Typology Code available for payment source | Attending: Emergency Medicine | Admitting: Emergency Medicine

## 2020-08-08 DIAGNOSIS — M25562 Pain in left knee: Secondary | ICD-10-CM | POA: Diagnosis not present

## 2020-08-08 DIAGNOSIS — M25551 Pain in right hip: Secondary | ICD-10-CM | POA: Diagnosis not present

## 2020-08-08 DIAGNOSIS — R220 Localized swelling, mass and lump, head: Secondary | ICD-10-CM | POA: Diagnosis not present

## 2020-08-08 DIAGNOSIS — Z79899 Other long term (current) drug therapy: Secondary | ICD-10-CM | POA: Insufficient documentation

## 2020-08-08 DIAGNOSIS — Z8616 Personal history of COVID-19: Secondary | ICD-10-CM | POA: Diagnosis not present

## 2020-08-08 DIAGNOSIS — W050XXA Fall from non-moving wheelchair, initial encounter: Secondary | ICD-10-CM | POA: Diagnosis not present

## 2020-08-08 DIAGNOSIS — S80212A Abrasion, left knee, initial encounter: Secondary | ICD-10-CM | POA: Diagnosis not present

## 2020-08-08 DIAGNOSIS — Z043 Encounter for examination and observation following other accident: Secondary | ICD-10-CM | POA: Diagnosis not present

## 2020-08-08 DIAGNOSIS — N186 End stage renal disease: Secondary | ICD-10-CM | POA: Diagnosis not present

## 2020-08-08 DIAGNOSIS — F17210 Nicotine dependence, cigarettes, uncomplicated: Secondary | ICD-10-CM | POA: Diagnosis not present

## 2020-08-08 DIAGNOSIS — R935 Abnormal findings on diagnostic imaging of other abdominal regions, including retroperitoneum: Secondary | ICD-10-CM | POA: Diagnosis not present

## 2020-08-08 DIAGNOSIS — Z7901 Long term (current) use of anticoagulants: Secondary | ICD-10-CM | POA: Diagnosis not present

## 2020-08-08 DIAGNOSIS — R296 Repeated falls: Secondary | ICD-10-CM | POA: Diagnosis not present

## 2020-08-08 DIAGNOSIS — Z794 Long term (current) use of insulin: Secondary | ICD-10-CM | POA: Insufficient documentation

## 2020-08-08 DIAGNOSIS — Z992 Dependence on renal dialysis: Secondary | ICD-10-CM | POA: Diagnosis not present

## 2020-08-08 DIAGNOSIS — W19XXXA Unspecified fall, initial encounter: Secondary | ICD-10-CM

## 2020-08-08 DIAGNOSIS — E1122 Type 2 diabetes mellitus with diabetic chronic kidney disease: Secondary | ICD-10-CM | POA: Diagnosis not present

## 2020-08-08 DIAGNOSIS — S00212A Abrasion of left eyelid and periocular area, initial encounter: Secondary | ICD-10-CM | POA: Diagnosis not present

## 2020-08-08 DIAGNOSIS — S0990XA Unspecified injury of head, initial encounter: Secondary | ICD-10-CM | POA: Diagnosis present

## 2020-08-08 DIAGNOSIS — S0181XA Laceration without foreign body of other part of head, initial encounter: Secondary | ICD-10-CM | POA: Diagnosis not present

## 2020-08-08 DIAGNOSIS — I120 Hypertensive chronic kidney disease with stage 5 chronic kidney disease or end stage renal disease: Secondary | ICD-10-CM | POA: Diagnosis not present

## 2020-08-08 DIAGNOSIS — M25511 Pain in right shoulder: Secondary | ICD-10-CM | POA: Diagnosis not present

## 2020-08-08 DIAGNOSIS — N281 Cyst of kidney, acquired: Secondary | ICD-10-CM | POA: Diagnosis not present

## 2020-08-08 DIAGNOSIS — R079 Chest pain, unspecified: Secondary | ICD-10-CM | POA: Diagnosis not present

## 2020-08-08 MED ORDER — ONDANSETRON 4 MG PO TBDP
4.0000 mg | ORAL_TABLET | Freq: Once | ORAL | Status: AC
  Administered 2020-08-08: 4 mg via ORAL
  Filled 2020-08-08: qty 1

## 2020-08-08 NOTE — ED Triage Notes (Addendum)
Pt BIB [facility] from [facility]. Pt fell out of wheelchair, hit left leg and face on concrete. Scrape on leg, dressed by [place] and scrape on face. Pt is taking Plavix. No LOC reported. 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  EMS reported vitals  Bp 184 palpated  Pulse 108 100 O2 RA CBG 341 Temp 99

## 2020-08-08 NOTE — ED Provider Notes (Signed)
[name] DE[facility] Provider Note   CSN: [id number] Arrival date & time: 08/08/20  1502    History Chief Complaint  Patient presents with  . Fall    On thinners    Richard [name] is a 64 y.o. male with PMH of IDDM, CVA, seizure disorder, ESRD on HD (TTS) and HTN who presented to the ED after falling out of wheelchair. Patient states he was outside talking to his fiance and his wheelchair suddenly started rolling back and eventually fell on him. Patient states he fell on his face and hit his forehead on the asphalt. He also states his left knee scraped the asphalt. Patient endorse frontal headache, right shoulder pain, left knee pain, hip pain and pain with movement of the neck. Patient denies any LOC, dizziness, blurry vision, chest pain, shortness of breath, back pain, abdominal pain, or nausea. Patient is on Plavix.   The history is provided by the patient.     Past Medical History:  Diagnosis Date  . Arthritis   . Diabetes mellitus, type II, insulin dependent ([place])   . GERD (gastroesophageal reflux disease)   . Hypertension   . Hypertensive emergency 05/21/2012  . Immune deficiency disorder ([place])   . Migraines   . Neuropathy in diabetes ([place])    bilat feet  . Ruptured lumbar disc   . Stroke [facility]) 2013   residual right sided weakness (arm>leg)    Patient Active Problem List   Diagnosis Date Noted  . 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  . Syncope 06/01/2020  . Secondary hyperparathyroidism of renal origin ([place]) 05/28/2020  . COVID-19 05/16/2020  . Hypokalemia 05/16/2020  . Allergy, unspecified, initial encounter 05/08/2020  . Anaphylactic shock, unspecified, initial encounter 05/08/2020  . Anemia in chronic kidney disease 05/08/2020  . Complication of vascular dialysis catheter 05/08/2020  . Dependence on renal dialysis ([place]) 05/08/2020  . Headache, unspecified 05/08/2020  . Iron deficiency anemia,  unspecified 05/08/2020  . Cerebellar stroke syndrome 05/07/2020  . Encounter for feeding tube placement   . Weakness   . Creatinine elevation   . Goals of care, counseling/discussion   . Hypertensive urgency   . Meningoencephalitis   . Palliative care by specialist   . ESRD (end stage renal disease) ([place])   . AIDS (acquired immune deficiency syndrome) ([place]) 04/13/2020  . Encephalopathy 04/13/2020  . Seizure ([place]) 04/10/2020  . History of cerebrovascular accident (CVA) with residual deficit 03/19/2020  . CKD (chronic kidney disease) stage 5, GFR less than 15 ml/min (HCC) 02/15/2020  . Altered mental status 02/13/2020  . Neuropathy in diabetes ([place])   . Migraines   . Hypertension   . GERD (gastroesophageal reflux disease)   . Diabetes mellitus, type II, insulin dependent ([place])   . Arthritis   . Hyperlipidemia 08/25/2012  . Chronic kidney disease, stage 4 (severe) ([place]) 08/23/2012  . Tobacco abuse 08/23/2012  . Malnutrition of moderate degree ([place]) 08/23/2012  . 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 AV FISTULA PLACEMENT Left 04/25/2020   Procedure: LEFT ARM BRACHIOCEPHALIC ARTERIOVENOUS (AV) FISTULA CREATION;  Surgeon: Rosetta Posner, MD;  Location: [place];  Service: Vascular;  Laterality: Left;  . IR FLUORO GUIDE CV LINE RIGHT  04/13/2020  . IR US GUIDE VASC ACCESS RIGHT  04/13/2020  . KNEE ARTHROSCOPY Bilateral       Family History  Problem Relation Age of  Onset  . Other Mother   . Stroke Father   . Hypertension Father   . Epilepsy Father   . Breast cancer Sister   . Stroke Brother   . Heart attack Brother   . Diabetes Sister     Social History   Tobacco Use  . Smoking status: Current Every Day Smoker    Packs/day: 0.50    Types: Cigarettes  . Smokeless tobacco: Never Used  Vaping Use  . Vaping Use: Never used  Substance Use Topics  . Alcohol use: No  . Drug use: Yes     Types: Marijuana    Home Medications Prior to Admission medications   Medication Sig Start Date End Date Taking? Authorizing Provider  ACCU-CHEK AVIVA PLUS test strip 1 each by Other route See admin instructions. Use 1 test strip to test blood sugar two-three times daily 04/20/18   [provider]  ACCU-CHEK AVIVA PLUS test strip USE 1 strip TO test blood sugar 2 TIMES DAILY TO 3 TIMES DAILY Patient taking differently: 1 each by Other route in the morning, at noon, and at bedtime.  06/10/19   Koberlein, [name], MD  amLODipine (NORVASC) 10 MG tablet TAKE 1 TABLET BY MOUTH DAILY Patient taking differently: Take 10 mg by mouth daily.  03/28/20   Koberlein, [name], MD  bictegravir-emtricitabine-tenofovir AF (BIKTARVY) 50-200-25 MG TABS tablet Take 1 tablet by mouth daily. 05/01/20   Oswald Hillock, MD  blood glucose meter kit and supplies KIT Dispense based on patient and insurance preference. Check blood sugar 4 times a day Patient taking differently: Inject 1 each into the skin See admin instructions. Dispense based on patient and insurance preference. Check blood sugar 4 times a day 06/11/18   [name], MD  calcitRIOL (ROCALTROL) 0.25 MCG capsule Take 1 capsule (0.25 mcg total) by mouth daily. 03/07/20   [name], MD  calcium acetate (PHOSLO) 667 MG tablet Take 667 mg by mouth 3 (three) times daily. 05/16/20   [provider]  carvedilol (COREG) 3.125 MG tablet Take 1 tablet (3.125 mg total) by mouth 2 (two) times daily with a meal. 05/07/20 06/06/20  Arrien, Jimmy Picket, MD  Cholecalciferol (VITAMIN D) 50 MCG (2000 UT) tablet Take 1 tablet (2,000 Units total) by mouth in the morning. 03/07/20   [name], MD  clopidogrel (PLAVIX) 75 MG tablet Take 1 tablet (75 mg total) by mouth daily. 03/07/20   [name], MD  hydrocortisone cream 1 % Apply 1 application topically 4 (four) times daily as needed for itching.    [provider]   hydrOXYzine (ATARAX/VISTARIL) 25 MG tablet Take 25 mg by mouth 3 (three) times daily. 05/16/20   [provider]  insulin aspart (NOVOLOG) 100 UNIT/ML injection Inject 0-9 Units into the skin 3 (three) times daily with meals. For glucose 150 to 200 use 2 units, for 201 to 250 use 3 units, for 251 to 300 use 5 units, for 301 to 350 use 7 units for 351 units or above use 9 units. 05/07/20 06/06/20  Arrien, Jimmy Picket, MD  insulin glargine (LANTUS) 100 UNIT/ML injection Inject 0.05 mLs (5 Units total) into the skin at bedtime. 05/07/20 06/06/20  Arrien, Jimmy Picket, MD  Insulin Pen Needle (SURE COMFORT PEN NEEDLES) 31G X 8 MM MISC Use as directed twice daily with Novolog flex pen Patient taking differently: 1 each by Other route See admin instructions. Use as directed twice daily with Novolog flex  pen 02/10/19   [name], MD  levETIRAcetam (KEPPRA) 500 MG tablet Take 1 tablet (500 mg total) by mouth 2 (two) times daily. 03/07/20 06/01/20  [name], MD  Misc. Devices (COMMODE 3-IN-1) MISC Use as directed 02/24/20   [name], MD  Misc. Devices (TRANSFER BENCH) MISC Use as directed 02/24/20   Koberlein, [name], MD  NARCAN 4 MG/0.1ML LIQD nasal spray kit Place 0.4 mg into the nose once.  05/31/18   [provider]  pantoprazole (PROTONIX) 40 MG tablet TAKE 1 TABLET BY MOUTH EVERY DAY Patient taking differently: Take 40 mg by mouth daily.  01/23/20   [name], MD  pravastatin (PRAVACHOL) 80 MG tablet Take 1 tablet (80 mg total) by mouth at bedtime. 03/07/20   [name], MD  sildenafil (REVATIO) 20 MG tablet Take 20-100 mg by mouth as needed for erectile dysfunction. 03/28/20   [provider]  sulfamethoxazole-trimethoprim (BACTRIM DS) 800-160 MG tablet Take 1 tablet by mouth 3 (three) times a week. 04/23/20   Little Ishikawa, MD  tamsulosin (FLOMAX) 0.4 MG CAPS capsule TAKE 1 CAPSULE BY MOUTH EVERY DAY Patient taking differently:  Take 0.4 mg by mouth daily.  03/28/20   [name], MD  TRUEPLUS INSULIN SYRINGE 31G X 5/16" 1 ML MISC Use pen needle with insulin 2 times daily Patient taking differently: 1 each by Other route in the morning and at bedtime. With insulin 03/31/19   Koberlein, Junell C, MD  zolpidem (AMBIEN) 5 MG tablet Take 5 mg by mouth at bedtime as needed for sleep. 05/16/20   [provider]    Allergies    Patient has no known allergies.  Review of Systems   Review of Systems  HENT: Negative for facial swelling and nosebleeds.   Eyes: Negative for pain and visual disturbance.  Respiratory: Negative for shortness of breath and wheezing.   Cardiovascular: Negative for chest pain and palpitations.  Gastrointestinal: Negative for abdominal pain.  Musculoskeletal: Positive for neck pain. Negative for back pain.  Skin: Negative for rash.  Neurological: Positive for weakness and headaches. Negative for dizziness and syncope.  Hematological: Does not bruise/bleed easily.  Psychiatric/Behavioral: Negative for confusion.   Physical Exam Updated Vital Signs BP (!) 157/64 (BP Location: Right Arm)   Pulse 97   Temp 98.2 F (36.8 C) (Oral)   Resp 18   SpO2 100%   Physical Exam Vitals reviewed.  HENT:     Head:     Comments: Small abrasion above left eye    Mouth/Throat:     Mouth: Mucous membranes are moist.  Eyes:     Extraocular Movements: Extraocular movements intact.     Pupils: Pupils are equal, round, and reactive to light.  Neck:     Comments: Pain on ROM Cardiovascular:     Rate and Rhythm: Normal rate and regular rhythm.     Pulses: Normal pulses.     Heart sounds: Normal heart sounds.  Pulmonary:     Effort: Pulmonary effort is normal. No respiratory distress.     Breath sounds: No wheezing.  Chest:     Chest wall: No tenderness.  Abdominal:     General: Abdomen is flat. There is no distension.     Palpations: Abdomen is soft.     Tenderness: There is no abdominal  tenderness.  Musculoskeletal:        General: Tenderness present. No deformity.     Cervical back:  Tenderness present.     Comments: Tenderness to palpation of the R shoulder  Lymphadenopathy:     Cervical: No cervical adenopathy.  Skin:    General: Skin is warm and dry.     Comments: Abrasion w/ erythema below left knee.   Neurological:     Mental Status: He is alert and oriented to person, place, and time.     Sensory: No sensory deficit.     Motor: Weakness present.     Comments: Contracted right arm and hand. Strength 1/5 RUE, 2/5 RLE, and 5/5 LUE and LLE. Sensation intact in all extremities.   Psychiatric:        Mood and Affect: Mood normal.     ED Results / Procedures / Treatments   Labs (all labs ordered are listed, but only abnormal results are displayed) Labs Reviewed - No data to display  EKG None  Radiology DG Chest 2 View  Result Date: 08/08/2020 CLINICAL DATA:  Fall, chest pain EXAM: CHEST - 2 VIEW COMPARISON:  06/01/2020 FINDINGS: Right dialysis catheter remains in place, unchanged. Heart and mediastinal contours are within normal limits. No focal opacities or effusions. No acute bony abnormality. IMPRESSION: No active cardiopulmonary disease. Electronically Signed   By: Rolm Baptise M.D.   On: 08/08/2020 17:50   DG Shoulder Right  Result Date: 08/08/2020 CLINICAL DATA:  Fall, right shoulder pain EXAM: RIGHT SHOULDER - 2+ VIEW COMPARISON:  None. FINDINGS: Degenerative changes in the [facility] joint with joint space narrowing and spurring. Glenohumeral joint is maintained. No acute bony abnormality. Specifically, no fracture, subluxation, or dislocation. Soft tissues are intact. Right dialysis catheter partially imaged. IMPRESSION: Degenerative changes in the right AC joint. No acute bony abnormality. Electronically Signed   By: Rolm Baptise M.D.   On: 08/08/2020 17:48   CT Head Wo Contrast  Result Date: 08/08/2020 CLINICAL DATA:  Fall EXAM: CT HEAD WITHOUT CONTRAST  TECHNIQUE: Contiguous axial images were obtained from the base of the skull through the vertex without intravenous contrast. COMPARISON:  06/01/2020 FINDINGS: Brain: There is atrophy and chronic small vessel disease changes. Old left basal ganglia and corona radiata lacunar infarcts. No acute intracranial abnormality. Specifically, no hemorrhage, hydrocephalus, mass lesion, acute infarction, or significant intracranial injury. Vascular: No hyperdense vessel or unexpected calcification. Skull: No acute calvarial abnormality. Sinuses/Orbits: Visualized paranasal sinuses and mastoids clear. Orbital soft tissues unremarkable. Other: None IMPRESSION: Atrophy, chronic microvascular disease. No acute intracranial abnormality. Old left basal ganglia and corona radiata lacunar infarcts. Electronically Signed   By: Rolm Baptise M.D.   On: 08/08/2020 18:27   CT Cervical Spine Wo Contrast  Result Date: 08/08/2020 CLINICAL DATA:  Fall EXAM: CT CERVICAL SPINE WITHOUT CONTRAST TECHNIQUE: Multidetector CT imaging of the cervical spine was performed without intravenous contrast. Multiplanar CT image reconstructions were also generated. COMPARISON:  03/27/2020 FINDINGS: Alignment: No subluxation Skull base and vertebrae: No acute fracture. No primary bone lesion or focal pathologic process. Soft tissues and spinal canal: No prevertebral fluid or swelling. No visible canal hematoma. Disc levels: Diffuse degenerative disc disease and facet disease. In addition Upper chest: No acute findings Other: Enlarged heterogeneous thyroid with scattered calcifications. IMPRESSION: No acute bony abnormality. Degenerative changes. Enlarged, heterogeneous thyroid. Recommend thyroid ultrasound in (ref: J Am Coll Radiol. 2015 Feb;12(2): 143-50). Electronically Signed   By: Rolm Baptise M.D.   On: 08/08/2020 18:30   DG Knee Complete 4 Views Left  Result Date: 08/08/2020 CLINICAL DATA:  Fall, knee pain EXAM: LEFT KNEE -  COMPLETE 4+ VIEW  COMPARISON:  None. FINDINGS: Joint space narrowing and spurring in the patellofemoral compartment. No joint effusion. No acute bony abnormality. Specifically, no fracture, subluxation, or dislocation. Vascular calcifications noted. IMPRESSION: Degenerative changes in the patellofemoral compartment. No acute bony abnormality. Electronically Signed   By: Rolm Baptise M.D.   On: 08/08/2020 17:48   DG Hip Unilat With Pelvis 2-3 Views Right  Result Date: 08/08/2020 CLINICAL DATA:  Fall, hip pain EXAM: DG HIP (WITH OR WITHOUT PELVIS) 2-3V RIGHT COMPARISON:  None FINDINGS: Early spurring in the hip joints. SI joints symmetric and unremarkable. No acute bony abnormality. Specifically, no fracture, subluxation, or dislocation. Vascular calcifications noted. IMPRESSION: No acute bony abnormality. Electronically Signed   By: Rolm Baptise M.D.   On: 08/08/2020 17:49   Procedures Procedures (including critical care time)  Medications Ordered in ED Medications - No data to display  ED Course  I have reviewed the triage vital signs and the nursing notes.  Pertinent labs & imaging results that were available during my care of the patient were reviewed by me and considered in my medical decision making (see chart for details).    MDM Rules/Calculators/A&P                         Mr. Cahalan is a 64 year old with a history of CVA on Plavix who presented for evaluation after a mechanical fall from a wheelchair. Patient sustained abrasion to the top of the left eye and below left knee. No active bleeding. Residual weakness from CVA but no acute neurological deficits. Normal mental status.  X-ray negative for acute fractures or dislocations. No acute bony abnormality on CT C-spine. No acute intracranial abnormality on CT head.  Incidental finding of enlarged heterogeneous thyroid on imaging.  Discharging back to [facility]. Recommending outpatient follow-up with ultrasound of thyroid.  Final Clinical  Impression(s) / ED Diagnoses Final diagnoses:  None  Fall  Rx / DC Orders ED Discharge Orders    None       Lacinda Axon, MD 08/08/20 1901    Quintella Reichert, MD 08/11/20 2051

## 2020-08-08 NOTE — ED Notes (Signed)
[facility] called @ 1904-per Hannie, RN called by Jenifer Struve-two a head of patient.

## 2020-08-08 NOTE — ED Notes (Signed)
Attempted report to [facility]; no answer

## 2020-08-08 NOTE — Discharge Instructions (Addendum)
Mr. Donaway, you were evaluated in the emergency room after you fell from your wheelchair. We performed multiple scans of your head, neck, right shoulder, left knee and hip. All the scans were normal and did not show any injuries as a result of the fall. Your thyroid looked abnormal on one of the scans so we want you to follow-up with an ultrasound to do further assessment.  Please call your primary care doctor to set up an appointment.

## 2020-08-08 NOTE — ED Notes (Signed)
Pt in MRI.

## 2020-08-08 NOTE — Telephone Encounter (Signed)
Spoke with Deniqua at the pts cell number, which was [facility].  She stated the resident is not available at this time and she will have him return a call to the office.

## 2020-08-09 ENCOUNTER — Emergency Department (HOSPITAL_COMMUNITY): Payer: No Typology Code available for payment source

## 2020-08-09 DIAGNOSIS — R220 Localized swelling, mass and lump, head: Secondary | ICD-10-CM | POA: Diagnosis not present

## 2020-08-09 DIAGNOSIS — R296 Repeated falls: Secondary | ICD-10-CM | POA: Diagnosis not present

## 2020-08-09 DIAGNOSIS — N529 Male erectile dysfunction, unspecified: Secondary | ICD-10-CM | POA: Diagnosis not present

## 2020-08-09 DIAGNOSIS — N281 Cyst of kidney, acquired: Secondary | ICD-10-CM | POA: Diagnosis not present

## 2020-08-09 DIAGNOSIS — B20 Human immunodeficiency virus [HIV] disease: Secondary | ICD-10-CM | POA: Diagnosis not present

## 2020-08-09 LAB — BASIC METABOLIC PANEL
Anion gap: 13 (ref 5–15)
BUN: 43 mg/dL — ABNORMAL HIGH (ref 8–23)
CO2: 26 mmol/L (ref 22–32)
Calcium: 9.5 mg/dL (ref 8.9–10.3)
Chloride: 102 mmol/L (ref 98–111)
Creatinine, Ser: 9.44 mg/dL — ABNORMAL HIGH (ref 0.61–1.24)
GFR, Estimated: 6 mL/min — ABNORMAL LOW (ref >60)
Glucose, Bld: 108 mg/dL — ABNORMAL HIGH (ref 70–99)
Potassium: 5.5 mmol/L — ABNORMAL HIGH (ref 3.5–5.1)
Sodium: 141 mmol/L (ref 135–145)

## 2020-08-09 LAB — CBC WITH DIFFERENTIAL/PLATELET
Abs Immature Granulocytes: 0.02 10*3/uL (ref 0.00–0.07)
Basophils Absolute: 0 10*3/uL (ref 0.0–0.1)
Basophils Relative: 0 %
Eosinophils Absolute: 0.9 10*3/uL — ABNORMAL HIGH (ref 0.0–0.5)
Eosinophils Relative: 10 %
HCT: 34.8 % — ABNORMAL LOW (ref 39.0–52.0)
Hemoglobin: 11.5 g/dL — ABNORMAL LOW (ref 13.0–17.0)
Immature Granulocytes: 0 %
Lymphocytes Relative: 26 %
Lymphs Abs: 2.4 10*3/uL (ref 0.7–4.0)
MCH: 31.8 pg (ref 26.0–34.0)
MCHC: 33 g/dL (ref 30.0–36.0)
MCV: 96.1 fL (ref 80.0–100.0)
Monocytes Absolute: 0.9 10*3/uL (ref 0.1–1.0)
Monocytes Relative: 10 %
Neutro Abs: 5.2 10*3/uL (ref 1.7–7.7)
Neutrophils Relative %: 54 %
Platelets: 190 10*3/uL (ref 150–400)
RBC: 3.62 MIL/uL — ABNORMAL LOW (ref 4.22–5.81)
RDW: 13.9 % (ref 11.5–15.5)
WBC: 9.5 10*3/uL (ref 4.0–10.5)
nRBC: 0 % (ref 0.0–0.2)

## 2020-08-09 LAB — CBG MONITORING, ED: Glucose-Capillary: 116 mg/dL — ABNORMAL HIGH (ref 70–99)

## 2020-08-09 MED ORDER — IOHEXOL 300 MG/ML  SOLN: 100.0000 mL | Freq: Once | INTRAMUSCULAR | Status: DC

## 2020-08-09 MED ORDER — ONDANSETRON HCL 4 MG/2ML IJ SOLN
4.0000 mg | Freq: Once | INTRAMUSCULAR | Status: AC
  Administered 2020-08-09: 4 mg via INTRAVENOUS
  Filled 2020-08-09: qty 2

## 2020-08-09 MED ORDER — LEVETIRACETAM IN NACL 500 MG/100ML IV SOLN
500.0000 mg | Freq: Once | INTRAVENOUS | Status: AC
  Administered 2020-08-09: 500 mg via INTRAVENOUS
  Filled 2020-08-09: qty 100

## 2020-08-09 MED ORDER — METOCLOPRAMIDE HCL 5 MG/ML IJ SOLN
5.0000 mg | Freq: Once | INTRAMUSCULAR | Status: AC
  Administered 2020-08-09: 5 mg via INTRAVENOUS
  Filled 2020-08-09: qty 2

## 2020-08-09 NOTE — ED Notes (Signed)
CBG Results 116.

## 2020-08-09 NOTE — ED Notes (Signed)
Took patient saline lock out 

## 2020-08-09 NOTE — ED Notes (Signed)
Pt refused blood work  

## 2020-08-09 NOTE — ED Notes (Signed)
Patient transported to CT 

## 2020-08-09 NOTE — ED Notes (Signed)
Called [facility] for transport.  

## 2020-08-09 NOTE — ED Provider Notes (Addendum)
Signed out to me after attempt to discharge patient was unsuccessful.  Upon arrival of [facility] patient complained of nausea and vomited.  He had a second CT that did not show any change.  Labs were ordered and he has been monitored.  It took a number of hours to get him to consent to the lab draw.  Labs are unremarkable other than his known baseline end-stage renal disease.  He did fail a second p.o. challenge.  A CT abdomen pelvis was therefore performed.  No acute abnormality is noted.  Patient has been given additional antiemetics and seems to be doing well.  Dizziness likely from hitting his head earlier today.  He has no focal deficits.  He will be appropriate for return to skilled nursing facility.   Orpah Greek, MD 08/09/20 [phone]

## 2020-08-11 DIAGNOSIS — N2581 Secondary hyperparathyroidism of renal origin: Secondary | ICD-10-CM | POA: Diagnosis not present

## 2020-08-11 DIAGNOSIS — Z992 Dependence on renal dialysis: Secondary | ICD-10-CM | POA: Diagnosis not present

## 2020-08-11 DIAGNOSIS — N186 End stage renal disease: Secondary | ICD-10-CM | POA: Diagnosis not present

## 2020-08-13 ENCOUNTER — Telehealth: Payer: Self-pay | Admitting: Family Medicine

## 2020-08-13 NOTE — Telephone Encounter (Signed)
Spoke with Justus Memory, unit manager at [facility] to inform her of the message below per PCP.  Charnelle stated she will advise the pt of instructions as below.

## 2020-08-13 NOTE — Telephone Encounter (Signed)
He has seen dr. Loanne Drilling for diabetes in past; I would suggest calling their office. He does need follow up here though as well as it has been months since he has been seen and has had a lot of changes in med status without hospital follow up.

## 2020-08-13 NOTE — Telephone Encounter (Signed)
Pt is calling in stating that he is having problems with his insulin that the nursing home [facility]) is giving to him he is getting sick every time he takes it and would like to be seen pt would like to have a call so that he can make an appointment.

## 2020-08-13 NOTE — Telephone Encounter (Signed)
Left a message at the nurses station for the pt to return my call and also left a message with the driver Trina Ao [phone]) to have a nurse/patient return my call.

## 2020-08-14 ENCOUNTER — Other Ambulatory Visit: Payer: Self-pay | Admitting: Family Medicine

## 2020-08-14 DIAGNOSIS — L309 Dermatitis, unspecified: Secondary | ICD-10-CM

## 2020-08-15 DIAGNOSIS — N2581 Secondary hyperparathyroidism of renal origin: Secondary | ICD-10-CM | POA: Diagnosis not present

## 2020-08-15 DIAGNOSIS — Z992 Dependence on renal dialysis: Secondary | ICD-10-CM | POA: Diagnosis not present

## 2020-08-15 DIAGNOSIS — R110 Nausea: Secondary | ICD-10-CM | POA: Diagnosis not present

## 2020-08-15 DIAGNOSIS — N186 End stage renal disease: Secondary | ICD-10-CM | POA: Diagnosis not present

## 2020-08-21 DIAGNOSIS — E1122 Type 2 diabetes mellitus with diabetic chronic kidney disease: Secondary | ICD-10-CM | POA: Diagnosis not present

## 2020-08-21 DIAGNOSIS — Z992 Dependence on renal dialysis: Secondary | ICD-10-CM | POA: Diagnosis not present

## 2020-08-21 DIAGNOSIS — N186 End stage renal disease: Secondary | ICD-10-CM | POA: Diagnosis not present

## 2020-08-24 DIAGNOSIS — I10 Essential (primary) hypertension: Secondary | ICD-10-CM | POA: Diagnosis not present

## 2020-08-24 DIAGNOSIS — N186 End stage renal disease: Secondary | ICD-10-CM | POA: Diagnosis not present

## 2020-08-24 DIAGNOSIS — E119 Type 2 diabetes mellitus without complications: Secondary | ICD-10-CM | POA: Diagnosis not present

## 2020-08-24 DIAGNOSIS — B20 Human immunodeficiency virus [HIV] disease: Secondary | ICD-10-CM | POA: Diagnosis not present

## 2020-08-28 DIAGNOSIS — Z992 Dependence on renal dialysis: Secondary | ICD-10-CM | POA: Diagnosis not present

## 2020-08-28 DIAGNOSIS — N2581 Secondary hyperparathyroidism of renal origin: Secondary | ICD-10-CM | POA: Diagnosis not present

## 2020-08-28 DIAGNOSIS — N186 End stage renal disease: Secondary | ICD-10-CM | POA: Diagnosis not present

## 2020-08-29 DIAGNOSIS — Z720 Tobacco use: Secondary | ICD-10-CM | POA: Diagnosis not present

## 2020-08-29 DIAGNOSIS — B20 Human immunodeficiency virus [HIV] disease: Secondary | ICD-10-CM | POA: Diagnosis not present

## 2020-08-29 DIAGNOSIS — N186 End stage renal disease: Secondary | ICD-10-CM | POA: Diagnosis not present

## 2020-08-29 DIAGNOSIS — L299 Pruritus, unspecified: Secondary | ICD-10-CM | POA: Diagnosis not present

## 2020-08-29 DIAGNOSIS — I10 Essential (primary) hypertension: Secondary | ICD-10-CM | POA: Diagnosis not present

## 2020-08-29 DIAGNOSIS — E119 Type 2 diabetes mellitus without complications: Secondary | ICD-10-CM | POA: Diagnosis not present

## 2020-09-04 DIAGNOSIS — N2581 Secondary hyperparathyroidism of renal origin: Secondary | ICD-10-CM | POA: Diagnosis not present

## 2020-09-04 DIAGNOSIS — N186 End stage renal disease: Secondary | ICD-10-CM | POA: Diagnosis not present

## 2020-09-04 DIAGNOSIS — Z992 Dependence on renal dialysis: Secondary | ICD-10-CM | POA: Diagnosis not present

## 2020-09-06 DIAGNOSIS — E119 Type 2 diabetes mellitus without complications: Secondary | ICD-10-CM | POA: Diagnosis not present

## 2020-09-06 DIAGNOSIS — L309 Dermatitis, unspecified: Secondary | ICD-10-CM | POA: Diagnosis not present

## 2020-09-06 DIAGNOSIS — L299 Pruritus, unspecified: Secondary | ICD-10-CM | POA: Diagnosis not present

## 2020-09-06 DIAGNOSIS — I10 Essential (primary) hypertension: Secondary | ICD-10-CM | POA: Diagnosis not present

## 2020-09-06 DIAGNOSIS — B20 Human immunodeficiency virus [HIV] disease: Secondary | ICD-10-CM | POA: Diagnosis not present

## 2020-09-07 DIAGNOSIS — R110 Nausea: Secondary | ICD-10-CM | POA: Diagnosis not present

## 2020-09-08 DIAGNOSIS — N186 End stage renal disease: Secondary | ICD-10-CM | POA: Diagnosis not present

## 2020-09-08 DIAGNOSIS — N2581 Secondary hyperparathyroidism of renal origin: Secondary | ICD-10-CM | POA: Diagnosis not present

## 2020-09-08 DIAGNOSIS — Z992 Dependence on renal dialysis: Secondary | ICD-10-CM | POA: Diagnosis not present

## 2020-09-11 DIAGNOSIS — N186 End stage renal disease: Secondary | ICD-10-CM | POA: Diagnosis not present

## 2020-09-11 DIAGNOSIS — Z992 Dependence on renal dialysis: Secondary | ICD-10-CM | POA: Diagnosis not present

## 2020-09-11 DIAGNOSIS — N2581 Secondary hyperparathyroidism of renal origin: Secondary | ICD-10-CM | POA: Diagnosis not present

## 2020-09-13 DIAGNOSIS — Z992 Dependence on renal dialysis: Secondary | ICD-10-CM | POA: Diagnosis not present

## 2020-09-13 DIAGNOSIS — N186 End stage renal disease: Secondary | ICD-10-CM | POA: Diagnosis not present

## 2020-09-13 DIAGNOSIS — N2581 Secondary hyperparathyroidism of renal origin: Secondary | ICD-10-CM | POA: Diagnosis not present

## 2020-09-18 DIAGNOSIS — N186 End stage renal disease: Secondary | ICD-10-CM | POA: Diagnosis not present

## 2020-09-18 DIAGNOSIS — N2581 Secondary hyperparathyroidism of renal origin: Secondary | ICD-10-CM | POA: Diagnosis not present

## 2020-09-18 DIAGNOSIS — Z992 Dependence on renal dialysis: Secondary | ICD-10-CM | POA: Diagnosis not present

## 2020-09-20 DIAGNOSIS — N186 End stage renal disease: Secondary | ICD-10-CM | POA: Diagnosis not present

## 2020-09-20 DIAGNOSIS — N2581 Secondary hyperparathyroidism of renal origin: Secondary | ICD-10-CM | POA: Diagnosis not present

## 2020-09-20 DIAGNOSIS — Z992 Dependence on renal dialysis: Secondary | ICD-10-CM | POA: Diagnosis not present

## 2020-09-21 DIAGNOSIS — Z992 Dependence on renal dialysis: Secondary | ICD-10-CM | POA: Diagnosis not present

## 2020-09-21 DIAGNOSIS — N186 End stage renal disease: Secondary | ICD-10-CM | POA: Diagnosis not present

## 2020-09-21 DIAGNOSIS — E1122 Type 2 diabetes mellitus with diabetic chronic kidney disease: Secondary | ICD-10-CM | POA: Diagnosis not present

## 2020-09-25 DIAGNOSIS — N2581 Secondary hyperparathyroidism of renal origin: Secondary | ICD-10-CM | POA: Diagnosis not present

## 2020-09-25 DIAGNOSIS — N186 End stage renal disease: Secondary | ICD-10-CM | POA: Diagnosis not present

## 2020-09-25 DIAGNOSIS — Z992 Dependence on renal dialysis: Secondary | ICD-10-CM | POA: Diagnosis not present

## 2020-09-27 DIAGNOSIS — N2581 Secondary hyperparathyroidism of renal origin: Secondary | ICD-10-CM | POA: Diagnosis not present

## 2020-09-27 DIAGNOSIS — N186 End stage renal disease: Secondary | ICD-10-CM | POA: Diagnosis not present

## 2020-09-27 DIAGNOSIS — Z992 Dependence on renal dialysis: Secondary | ICD-10-CM | POA: Diagnosis not present

## 2020-09-28 ENCOUNTER — Telehealth: Payer: Self-pay | Admitting: Family Medicine

## 2020-09-28 NOTE — Telephone Encounter (Signed)
Tried calling patient to  schedule Medicare Annual Wellness Visit (AWV) either virtually or in office.  No answer    Last AWV ; please schedule at anytime with [facility] Nurse Health Advisor 1 or 2   This should be a 45 minute visit. 

## 2020-10-02 DIAGNOSIS — N186 End stage renal disease: Secondary | ICD-10-CM | POA: Diagnosis not present

## 2020-10-02 DIAGNOSIS — Z992 Dependence on renal dialysis: Secondary | ICD-10-CM | POA: Diagnosis not present

## 2020-10-02 DIAGNOSIS — N2581 Secondary hyperparathyroidism of renal origin: Secondary | ICD-10-CM | POA: Diagnosis not present

## 2020-10-04 DIAGNOSIS — R110 Nausea: Secondary | ICD-10-CM | POA: Diagnosis not present

## 2020-10-04 DIAGNOSIS — L299 Pruritus, unspecified: Secondary | ICD-10-CM | POA: Diagnosis not present

## 2020-10-04 DIAGNOSIS — K219 Gastro-esophageal reflux disease without esophagitis: Secondary | ICD-10-CM | POA: Diagnosis not present

## 2020-10-04 DIAGNOSIS — N186 End stage renal disease: Secondary | ICD-10-CM | POA: Diagnosis not present

## 2020-10-04 DIAGNOSIS — I10 Essential (primary) hypertension: Secondary | ICD-10-CM | POA: Diagnosis not present

## 2020-10-22 DIAGNOSIS — I10 Essential (primary) hypertension: Secondary | ICD-10-CM | POA: Diagnosis not present

## 2020-10-22 DIAGNOSIS — N186 End stage renal disease: Secondary | ICD-10-CM | POA: Diagnosis not present

## 2020-10-22 DIAGNOSIS — E119 Type 2 diabetes mellitus without complications: Secondary | ICD-10-CM | POA: Diagnosis not present

## 2020-10-22 DIAGNOSIS — N400 Enlarged prostate without lower urinary tract symptoms: Secondary | ICD-10-CM | POA: Diagnosis not present

## 2020-10-22 DIAGNOSIS — E785 Hyperlipidemia, unspecified: Secondary | ICD-10-CM | POA: Diagnosis not present

## 2020-10-22 DIAGNOSIS — E559 Vitamin D deficiency, unspecified: Secondary | ICD-10-CM | POA: Diagnosis not present

## 2020-10-23 DIAGNOSIS — E559 Vitamin D deficiency, unspecified: Secondary | ICD-10-CM | POA: Diagnosis not present

## 2020-10-23 DIAGNOSIS — B351 Tinea unguium: Secondary | ICD-10-CM | POA: Diagnosis not present

## 2020-10-23 DIAGNOSIS — L299 Pruritus, unspecified: Secondary | ICD-10-CM | POA: Diagnosis not present

## 2020-10-23 DIAGNOSIS — E785 Hyperlipidemia, unspecified: Secondary | ICD-10-CM | POA: Diagnosis not present

## 2020-10-23 DIAGNOSIS — G4700 Insomnia, unspecified: Secondary | ICD-10-CM | POA: Diagnosis not present

## 2020-10-23 DIAGNOSIS — N186 End stage renal disease: Secondary | ICD-10-CM | POA: Diagnosis not present

## 2020-10-23 DIAGNOSIS — B20 Human immunodeficiency virus [HIV] disease: Secondary | ICD-10-CM | POA: Diagnosis not present

## 2020-10-23 DIAGNOSIS — L853 Xerosis cutis: Secondary | ICD-10-CM | POA: Diagnosis not present

## 2020-10-23 DIAGNOSIS — K219 Gastro-esophageal reflux disease without esophagitis: Secondary | ICD-10-CM | POA: Diagnosis not present

## 2020-10-31 DIAGNOSIS — I10 Essential (primary) hypertension: Secondary | ICD-10-CM | POA: Diagnosis not present

## 2020-10-31 DIAGNOSIS — E559 Vitamin D deficiency, unspecified: Secondary | ICD-10-CM | POA: Diagnosis not present

## 2020-10-31 DIAGNOSIS — N186 End stage renal disease: Secondary | ICD-10-CM | POA: Diagnosis not present

## 2020-10-31 DIAGNOSIS — N400 Enlarged prostate without lower urinary tract symptoms: Secondary | ICD-10-CM | POA: Diagnosis not present

## 2020-10-31 DIAGNOSIS — E119 Type 2 diabetes mellitus without complications: Secondary | ICD-10-CM | POA: Diagnosis not present

## 2020-10-31 DIAGNOSIS — E785 Hyperlipidemia, unspecified: Secondary | ICD-10-CM | POA: Diagnosis not present

## 2020-11-01 DIAGNOSIS — N186 End stage renal disease: Secondary | ICD-10-CM | POA: Diagnosis not present

## 2020-11-01 DIAGNOSIS — K219 Gastro-esophageal reflux disease without esophagitis: Secondary | ICD-10-CM | POA: Diagnosis not present

## 2020-11-01 DIAGNOSIS — B20 Human immunodeficiency virus [HIV] disease: Secondary | ICD-10-CM | POA: Diagnosis not present

## 2020-11-01 DIAGNOSIS — E785 Hyperlipidemia, unspecified: Secondary | ICD-10-CM | POA: Diagnosis not present

## 2020-11-01 DIAGNOSIS — L299 Pruritus, unspecified: Secondary | ICD-10-CM | POA: Diagnosis not present

## 2020-11-01 DIAGNOSIS — G4700 Insomnia, unspecified: Secondary | ICD-10-CM | POA: Diagnosis not present

## 2020-11-01 DIAGNOSIS — L853 Xerosis cutis: Secondary | ICD-10-CM | POA: Diagnosis not present

## 2020-11-01 DIAGNOSIS — B351 Tinea unguium: Secondary | ICD-10-CM | POA: Diagnosis not present

## 2020-11-02 ENCOUNTER — Other Ambulatory Visit: Payer: Self-pay | Admitting: Family Medicine

## 2020-11-05 IMAGING — CT CT HEAD W/O CM
4 series · 17 of 47 positions shown, 19 images · non-contrast
Comparison: 04/12/2020

CLINICAL DATA: Syncope

EXAM:
CT HEAD WITHOUT CONTRAST
TECHNIQUE: Contiguous axial images were obtained from the base of the skull
through the vertex without intravenous contrast.

[Series 3: head wo · axial · 0.43mm/px · z∈[-152,-32]mm · 7 of 32 slices shown, 9 images]
[im 4/32  brain]
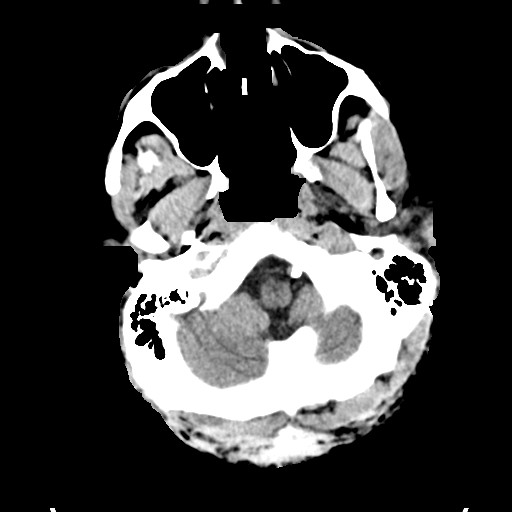
[im 4/32  bone]
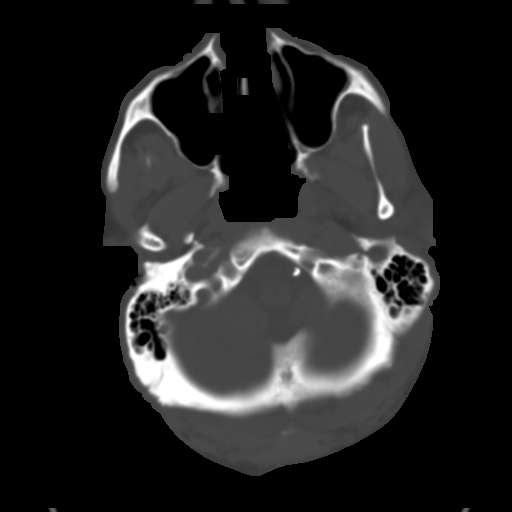
[im 8/32  brain]
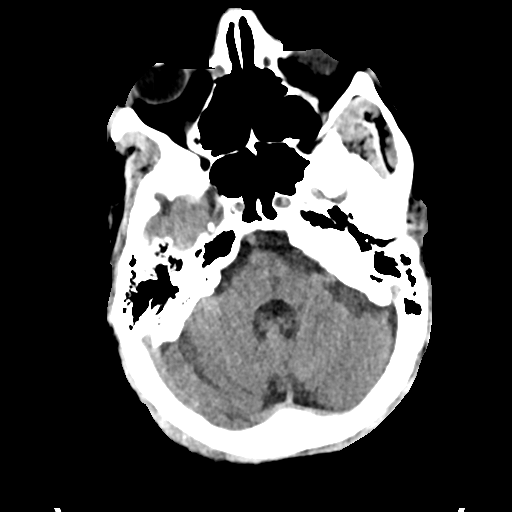
[im 12/32  brain]
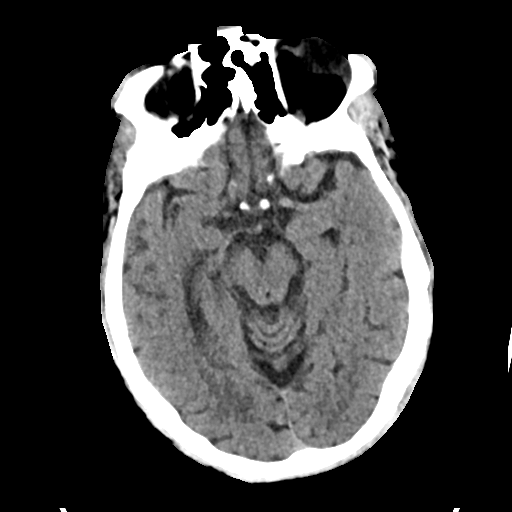
[im 16/32  brain]
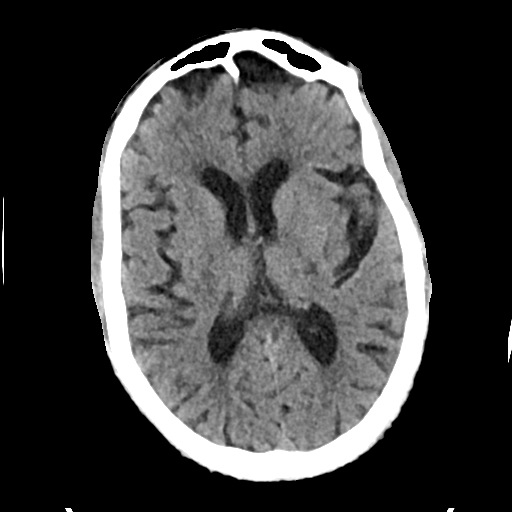
[im 20/32  brain]
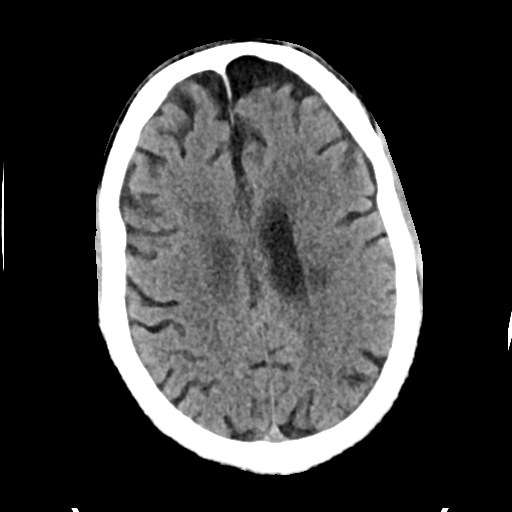
[im 20/32  bone]
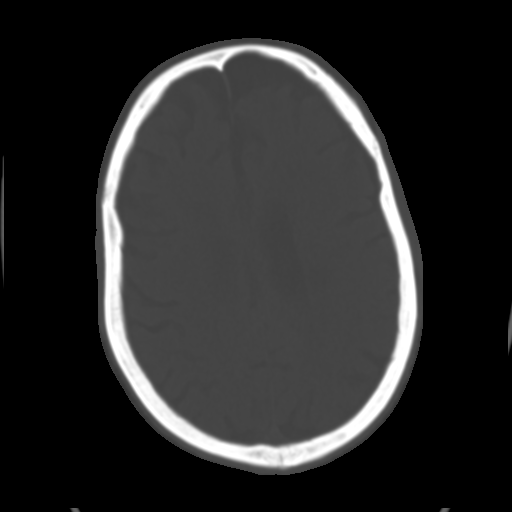
[im 24/32  brain]
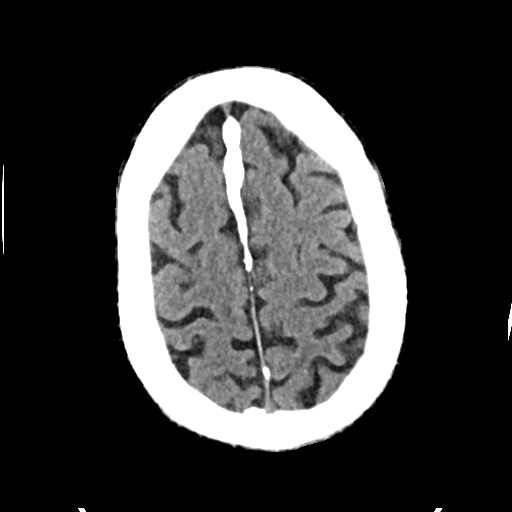
[im 28/32  brain]
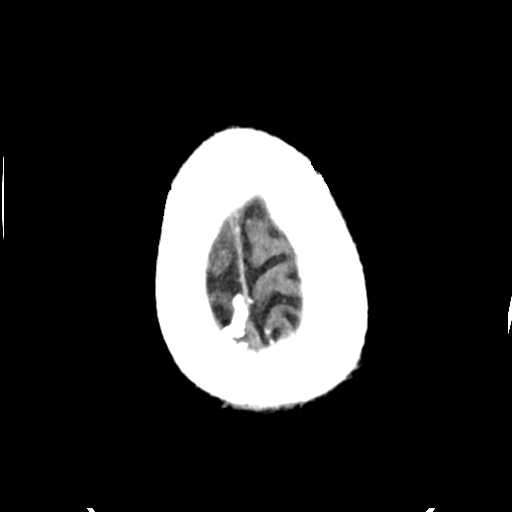

[Series 4: head bone · axial · 0.43mm/px · z∈[-153,-97]mm · 4 of 80 slices shown]
[im 8/80  bone]
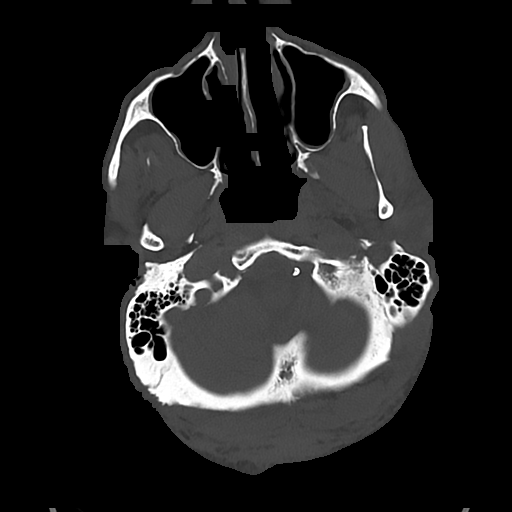
[im 16/80  bone]
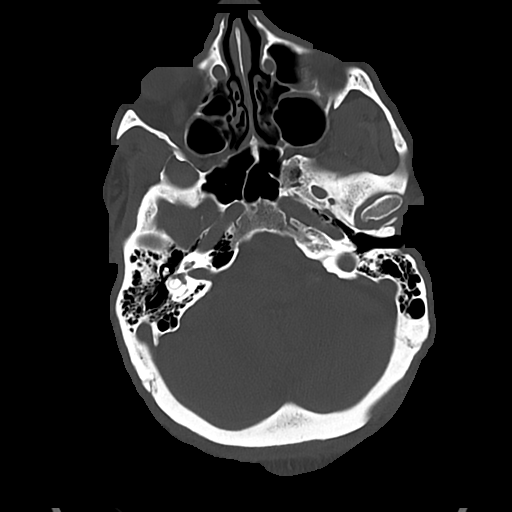
[im 24/80  bone]
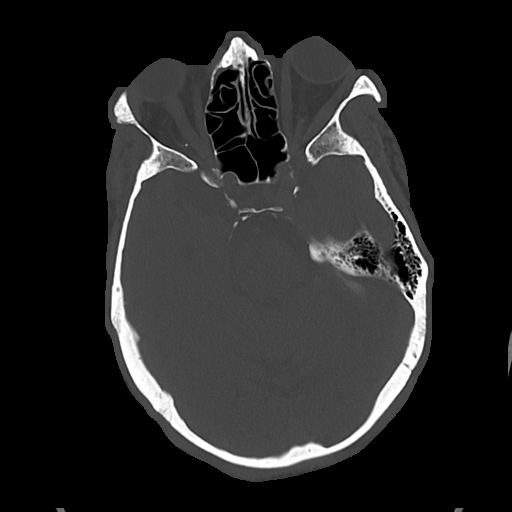
[im 36/80  bone]
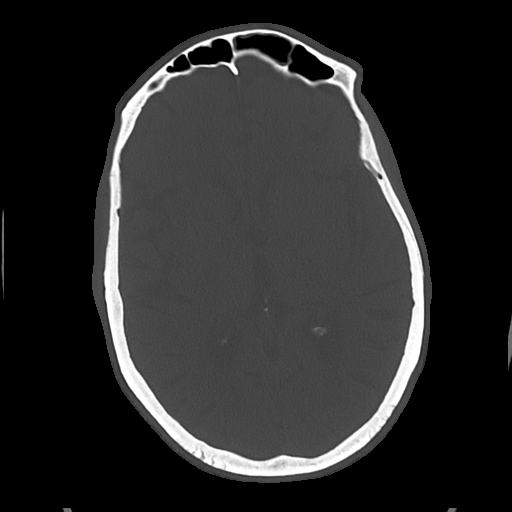

[Series 5: cor soft · coronal · 0.32mm/px · 3 of 75 slices shown]
[im 25/75  brain]
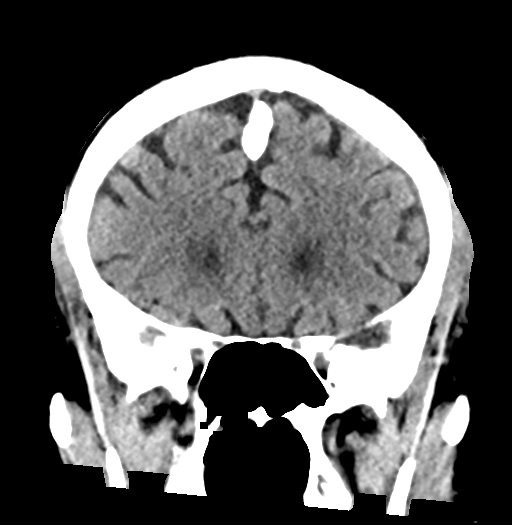
[im 33/75  brain]
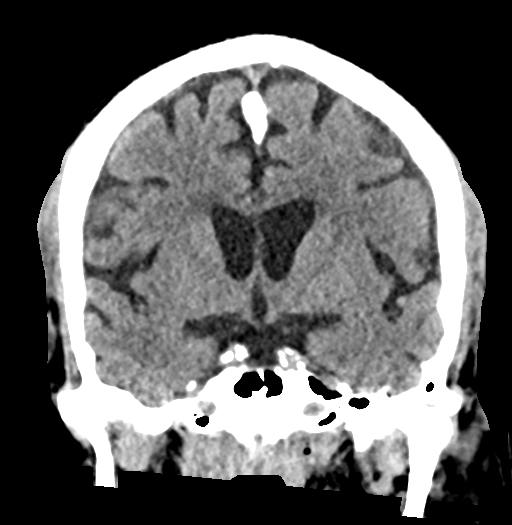
[im 42/75  brain]
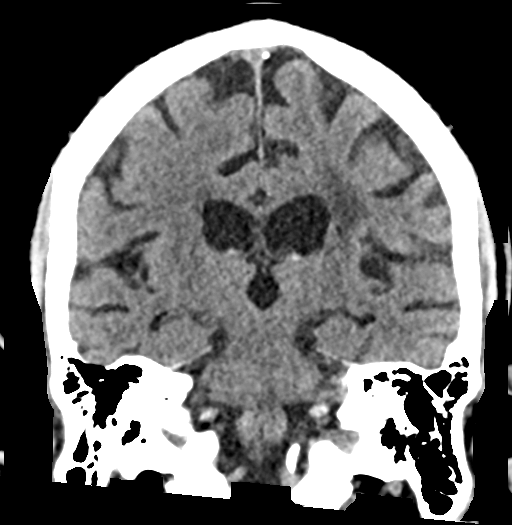

[Series 6: sag soft · sagittal · 0.31mm/px · 3 of 56 slices shown]
[im 19/56  brain]
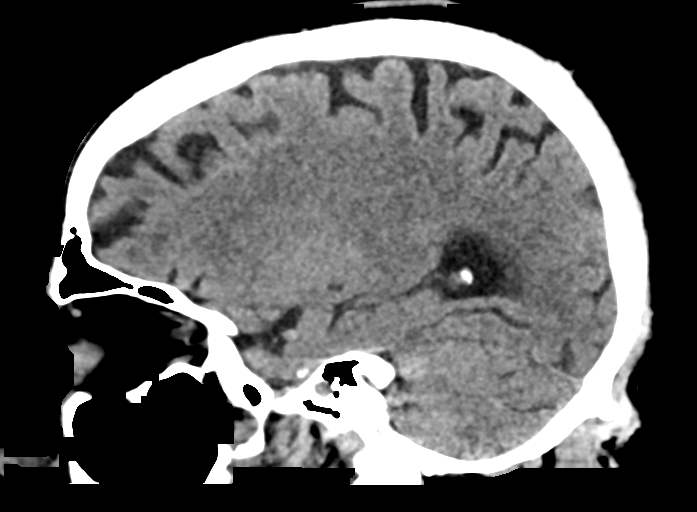
[im 28/56  brain]
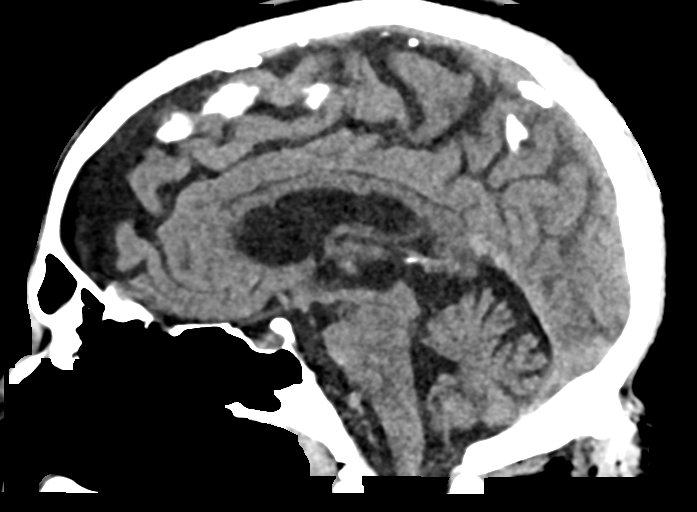
[im 37/56  brain]
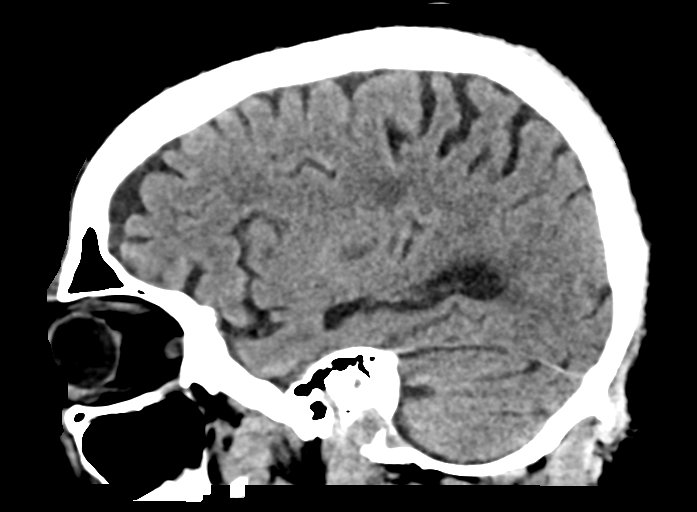

[17 of 47 positions shown; findings below may reference images not displayed]

FINDINGS: Brain: There is atrophy and chronic small vessel disease changes.
Old left basal ganglia and corona radiata lacunar infarcts. No acute
intracranial abnormality. Specifically, no hemorrhage,
hydrocephalus, mass lesion, acute infarction, or significant
intracranial injury.

Vascular: No hyperdense vessel or unexpected calcification.

Skull: No acute calvarial abnormality.

Sinuses/Orbits: No acute findings

Other: None
IMPRESSION: Old left basal ganglia/corona radiata lacunar infarcts.

Atrophy, chronic microvascular disease.

No acute intracranial abnormality.

## 2020-11-05 IMAGING — DX DG CHEST 1V PORT
1 series · 1 of 1 positions shown · non-contrast
Comparison: None.

CLINICAL DATA: Nausea and vomiting.

EXAM:
PORTABLE CHEST 1 VIEW

[chest]
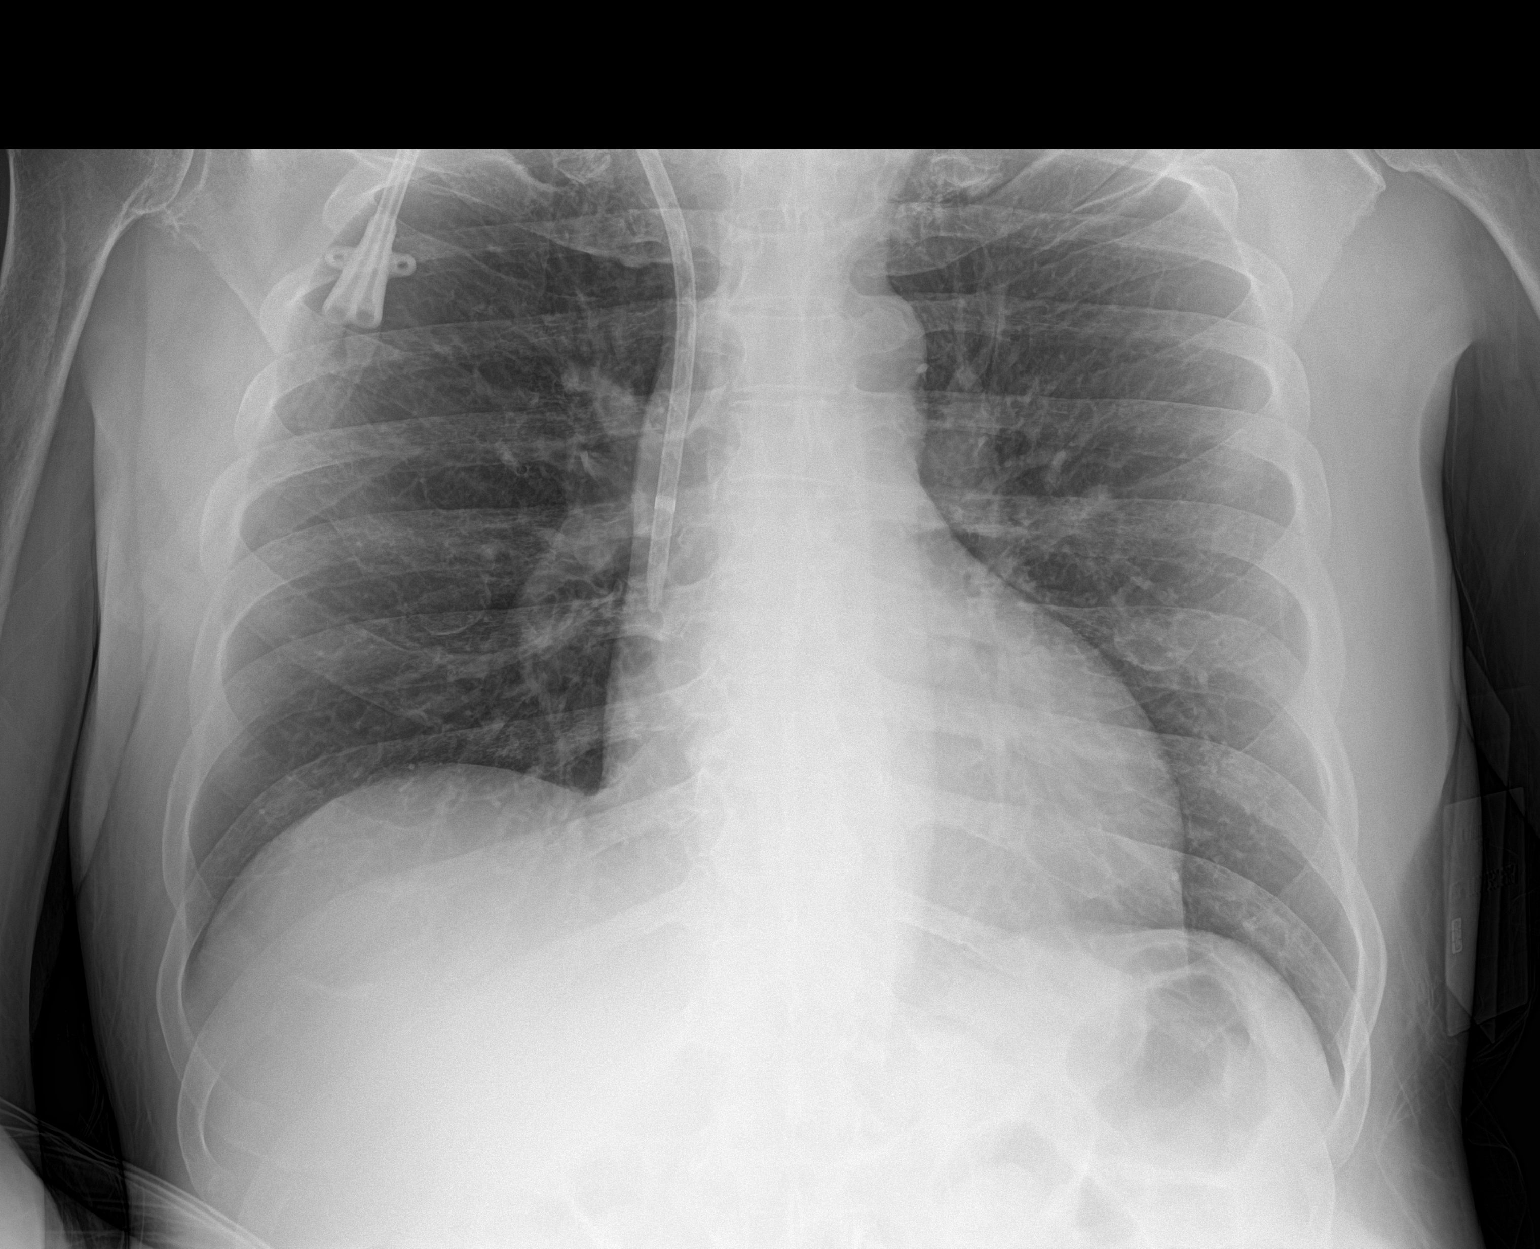

[1 of 1 positions shown; findings below may reference images not displayed]

FINDINGS: The heart size and mediastinal contours are within normal limits.
Both lungs are clear. The visualized skeletal structures are
unremarkable. Right central venous catheter with tip over the
cavoatrial junction region.
IMPRESSION: No active disease.

## 2020-11-25 DIAGNOSIS — F1290 Cannabis use, unspecified, uncomplicated: Secondary | ICD-10-CM | POA: Diagnosis not present

## 2020-11-27 DIAGNOSIS — E785 Hyperlipidemia, unspecified: Secondary | ICD-10-CM | POA: Diagnosis not present

## 2020-11-27 DIAGNOSIS — B351 Tinea unguium: Secondary | ICD-10-CM | POA: Diagnosis not present

## 2020-11-27 DIAGNOSIS — I10 Essential (primary) hypertension: Secondary | ICD-10-CM | POA: Diagnosis not present

## 2020-11-27 DIAGNOSIS — B20 Human immunodeficiency virus [HIV] disease: Secondary | ICD-10-CM | POA: Diagnosis not present

## 2020-11-27 DIAGNOSIS — E559 Vitamin D deficiency, unspecified: Secondary | ICD-10-CM | POA: Diagnosis not present

## 2020-11-27 DIAGNOSIS — E119 Type 2 diabetes mellitus without complications: Secondary | ICD-10-CM | POA: Diagnosis not present

## 2020-11-27 DIAGNOSIS — N186 End stage renal disease: Secondary | ICD-10-CM | POA: Diagnosis not present

## 2020-11-27 DIAGNOSIS — R569 Unspecified convulsions: Secondary | ICD-10-CM | POA: Diagnosis not present

## 2020-11-27 DIAGNOSIS — K219 Gastro-esophageal reflux disease without esophagitis: Secondary | ICD-10-CM | POA: Diagnosis not present

## 2020-11-29 DIAGNOSIS — N186 End stage renal disease: Secondary | ICD-10-CM | POA: Diagnosis not present

## 2020-11-29 DIAGNOSIS — G4700 Insomnia, unspecified: Secondary | ICD-10-CM | POA: Diagnosis not present

## 2020-11-29 DIAGNOSIS — L299 Pruritus, unspecified: Secondary | ICD-10-CM | POA: Diagnosis not present

## 2020-11-29 DIAGNOSIS — B20 Human immunodeficiency virus [HIV] disease: Secondary | ICD-10-CM | POA: Diagnosis not present

## 2020-11-29 DIAGNOSIS — B351 Tinea unguium: Secondary | ICD-10-CM | POA: Diagnosis not present

## 2020-11-29 DIAGNOSIS — N400 Enlarged prostate without lower urinary tract symptoms: Secondary | ICD-10-CM | POA: Diagnosis not present

## 2020-11-29 DIAGNOSIS — E559 Vitamin D deficiency, unspecified: Secondary | ICD-10-CM | POA: Diagnosis not present

## 2020-11-29 DIAGNOSIS — I10 Essential (primary) hypertension: Secondary | ICD-10-CM | POA: Diagnosis not present

## 2020-11-29 DIAGNOSIS — L853 Xerosis cutis: Secondary | ICD-10-CM | POA: Diagnosis not present

## 2020-12-04 ENCOUNTER — Telehealth: Payer: Self-pay | Admitting: Family Medicine

## 2020-12-04 NOTE — Telephone Encounter (Signed)
Left message for patient to call back and schedule Medicare Annual Wellness Visit (AWV) either virtually or in office. No detailed message left   Last AWV no information  please schedule at anytime with [facility] Nurse Health Advisor 1 or 2   This should be a 45 minute visit.

## 2020-12-06 DIAGNOSIS — R0989 Other specified symptoms and signs involving the circulatory and respiratory systems: Secondary | ICD-10-CM | POA: Diagnosis not present

## 2020-12-06 DIAGNOSIS — R0602 Shortness of breath: Secondary | ICD-10-CM | POA: Diagnosis not present

## 2020-12-07 DIAGNOSIS — M79675 Pain in left toe(s): Secondary | ICD-10-CM | POA: Diagnosis not present

## 2020-12-07 DIAGNOSIS — M2141 Flat foot [pes planus] (acquired), right foot: Secondary | ICD-10-CM | POA: Diagnosis not present

## 2020-12-07 DIAGNOSIS — E1151 Type 2 diabetes mellitus with diabetic peripheral angiopathy without gangrene: Secondary | ICD-10-CM | POA: Diagnosis not present

## 2020-12-07 DIAGNOSIS — R262 Difficulty in walking, not elsewhere classified: Secondary | ICD-10-CM | POA: Diagnosis not present

## 2020-12-07 DIAGNOSIS — M79674 Pain in right toe(s): Secondary | ICD-10-CM | POA: Diagnosis not present

## 2020-12-07 DIAGNOSIS — B351 Tinea unguium: Secondary | ICD-10-CM | POA: Diagnosis not present

## 2020-12-07 DIAGNOSIS — Z794 Long term (current) use of insulin: Secondary | ICD-10-CM | POA: Diagnosis not present

## 2020-12-10 DIAGNOSIS — N186 End stage renal disease: Secondary | ICD-10-CM | POA: Diagnosis not present

## 2020-12-10 DIAGNOSIS — R569 Unspecified convulsions: Secondary | ICD-10-CM | POA: Diagnosis not present

## 2020-12-10 DIAGNOSIS — K219 Gastro-esophageal reflux disease without esophagitis: Secondary | ICD-10-CM | POA: Diagnosis not present

## 2020-12-10 DIAGNOSIS — R0989 Other specified symptoms and signs involving the circulatory and respiratory systems: Secondary | ICD-10-CM | POA: Diagnosis not present

## 2020-12-10 DIAGNOSIS — E785 Hyperlipidemia, unspecified: Secondary | ICD-10-CM | POA: Diagnosis not present

## 2020-12-10 DIAGNOSIS — N400 Enlarged prostate without lower urinary tract symptoms: Secondary | ICD-10-CM | POA: Diagnosis not present

## 2020-12-10 DIAGNOSIS — B20 Human immunodeficiency virus [HIV] disease: Secondary | ICD-10-CM | POA: Diagnosis not present

## 2020-12-18 DIAGNOSIS — E559 Vitamin D deficiency, unspecified: Secondary | ICD-10-CM | POA: Diagnosis not present

## 2020-12-18 DIAGNOSIS — E119 Type 2 diabetes mellitus without complications: Secondary | ICD-10-CM | POA: Diagnosis not present

## 2020-12-18 DIAGNOSIS — I10 Essential (primary) hypertension: Secondary | ICD-10-CM | POA: Diagnosis not present

## 2020-12-18 DIAGNOSIS — N186 End stage renal disease: Secondary | ICD-10-CM | POA: Diagnosis not present

## 2020-12-18 DIAGNOSIS — N400 Enlarged prostate without lower urinary tract symptoms: Secondary | ICD-10-CM | POA: Diagnosis not present

## 2020-12-18 DIAGNOSIS — E785 Hyperlipidemia, unspecified: Secondary | ICD-10-CM | POA: Diagnosis not present

## 2020-12-20 DIAGNOSIS — Z992 Dependence on renal dialysis: Secondary | ICD-10-CM | POA: Insufficient documentation

## 2020-12-20 DIAGNOSIS — B20 Human immunodeficiency virus [HIV] disease: Secondary | ICD-10-CM | POA: Insufficient documentation

## 2020-12-20 DIAGNOSIS — I69351 Hemiplegia and hemiparesis following cerebral infarction affecting right dominant side: Secondary | ICD-10-CM | POA: Insufficient documentation

## 2020-12-20 DIAGNOSIS — N186 End stage renal disease: Secondary | ICD-10-CM | POA: Insufficient documentation

## 2020-12-20 DIAGNOSIS — Z9981 Dependence on supplemental oxygen: Secondary | ICD-10-CM | POA: Insufficient documentation

## 2020-12-27 DIAGNOSIS — N400 Enlarged prostate without lower urinary tract symptoms: Secondary | ICD-10-CM | POA: Diagnosis not present

## 2020-12-27 DIAGNOSIS — B20 Human immunodeficiency virus [HIV] disease: Secondary | ICD-10-CM | POA: Diagnosis not present

## 2020-12-27 DIAGNOSIS — E119 Type 2 diabetes mellitus without complications: Secondary | ICD-10-CM | POA: Diagnosis not present

## 2020-12-27 DIAGNOSIS — I10 Essential (primary) hypertension: Secondary | ICD-10-CM | POA: Diagnosis not present

## 2020-12-27 DIAGNOSIS — N186 End stage renal disease: Secondary | ICD-10-CM | POA: Diagnosis not present

## 2020-12-27 DIAGNOSIS — L0232 Furuncle of buttock: Secondary | ICD-10-CM | POA: Diagnosis not present

## 2020-12-27 DIAGNOSIS — K219 Gastro-esophageal reflux disease without esophagitis: Secondary | ICD-10-CM | POA: Diagnosis not present

## 2020-12-27 DIAGNOSIS — E785 Hyperlipidemia, unspecified: Secondary | ICD-10-CM | POA: Diagnosis not present

## 2020-12-27 DIAGNOSIS — R569 Unspecified convulsions: Secondary | ICD-10-CM | POA: Diagnosis not present

## 2021-01-01 DIAGNOSIS — E559 Vitamin D deficiency, unspecified: Secondary | ICD-10-CM | POA: Diagnosis not present

## 2021-01-01 DIAGNOSIS — N400 Enlarged prostate without lower urinary tract symptoms: Secondary | ICD-10-CM | POA: Diagnosis not present

## 2021-01-01 DIAGNOSIS — N186 End stage renal disease: Secondary | ICD-10-CM | POA: Diagnosis not present

## 2021-01-01 DIAGNOSIS — E785 Hyperlipidemia, unspecified: Secondary | ICD-10-CM | POA: Diagnosis not present

## 2021-01-01 DIAGNOSIS — I10 Essential (primary) hypertension: Secondary | ICD-10-CM | POA: Diagnosis not present

## 2021-01-01 DIAGNOSIS — E119 Type 2 diabetes mellitus without complications: Secondary | ICD-10-CM | POA: Diagnosis not present

## 2021-01-01 DIAGNOSIS — Z125 Encounter for screening for malignant neoplasm of prostate: Secondary | ICD-10-CM | POA: Diagnosis not present

## 2021-01-02 DIAGNOSIS — N186 End stage renal disease: Secondary | ICD-10-CM | POA: Diagnosis not present

## 2021-01-02 DIAGNOSIS — B20 Human immunodeficiency virus [HIV] disease: Secondary | ICD-10-CM | POA: Diagnosis not present

## 2021-01-02 DIAGNOSIS — L0232 Furuncle of buttock: Secondary | ICD-10-CM | POA: Diagnosis not present

## 2021-01-02 DIAGNOSIS — L299 Pruritus, unspecified: Secondary | ICD-10-CM | POA: Diagnosis not present

## 2021-01-02 DIAGNOSIS — I10 Essential (primary) hypertension: Secondary | ICD-10-CM | POA: Diagnosis not present

## 2021-01-02 DIAGNOSIS — E785 Hyperlipidemia, unspecified: Secondary | ICD-10-CM | POA: Diagnosis not present

## 2021-01-02 DIAGNOSIS — G4700 Insomnia, unspecified: Secondary | ICD-10-CM | POA: Diagnosis not present

## 2021-01-02 DIAGNOSIS — K219 Gastro-esophageal reflux disease without esophagitis: Secondary | ICD-10-CM | POA: Diagnosis not present

## 2021-01-02 DIAGNOSIS — R569 Unspecified convulsions: Secondary | ICD-10-CM | POA: Diagnosis not present

## 2021-01-10 ENCOUNTER — Emergency Department (HOSPITAL_COMMUNITY): Payer: No Typology Code available for payment source

## 2021-01-10 ENCOUNTER — Other Ambulatory Visit: Payer: Self-pay

## 2021-01-10 ENCOUNTER — Inpatient Hospital Stay (HOSPITAL_COMMUNITY)
Admission: EM | Admit: 2021-01-10 | Discharge: 2021-01-18 | DRG: 640 | Disposition: A | Discharge status: Skilled Nursing Facility | Payer: No Typology Code available for payment source | Attending: Internal Medicine | Admitting: Internal Medicine

## 2021-01-10 ENCOUNTER — Encounter (HOSPITAL_COMMUNITY): Payer: Self-pay | Admitting: Pharmacy Technician

## 2021-01-10 DIAGNOSIS — Z885 Allergy status to narcotic agent status: Secondary | ICD-10-CM

## 2021-01-10 DIAGNOSIS — G40909 Epilepsy, unspecified, not intractable, without status epilepticus: Secondary | ICD-10-CM | POA: Diagnosis present

## 2021-01-10 DIAGNOSIS — K219 Gastro-esophageal reflux disease without esophagitis: Secondary | ICD-10-CM | POA: Diagnosis present

## 2021-01-10 DIAGNOSIS — Z7902 Long term (current) use of antithrombotics/antiplatelets: Secondary | ICD-10-CM

## 2021-01-10 DIAGNOSIS — R1111 Vomiting without nausea: Secondary | ICD-10-CM | POA: Diagnosis not present

## 2021-01-10 DIAGNOSIS — N186 End stage renal disease: Secondary | ICD-10-CM | POA: Diagnosis present

## 2021-01-10 DIAGNOSIS — F32A Depression, unspecified: Secondary | ICD-10-CM | POA: Diagnosis present

## 2021-01-10 DIAGNOSIS — E161 Other hypoglycemia: Secondary | ICD-10-CM | POA: Diagnosis not present

## 2021-01-10 DIAGNOSIS — I517 Cardiomegaly: Secondary | ICD-10-CM | POA: Diagnosis present

## 2021-01-10 DIAGNOSIS — Z993 Dependence on wheelchair: Secondary | ICD-10-CM

## 2021-01-10 DIAGNOSIS — R531 Weakness: Secondary | ICD-10-CM | POA: Diagnosis not present

## 2021-01-10 DIAGNOSIS — K649 Unspecified hemorrhoids: Secondary | ICD-10-CM

## 2021-01-10 DIAGNOSIS — E1169 Type 2 diabetes mellitus with other specified complication: Secondary | ICD-10-CM

## 2021-01-10 DIAGNOSIS — E785 Hyperlipidemia, unspecified: Secondary | ICD-10-CM | POA: Diagnosis present

## 2021-01-10 DIAGNOSIS — Z21 Asymptomatic human immunodeficiency virus [HIV] infection status: Secondary | ICD-10-CM | POA: Diagnosis present

## 2021-01-10 DIAGNOSIS — E162 Hypoglycemia, unspecified: Secondary | ICD-10-CM | POA: Diagnosis not present

## 2021-01-10 DIAGNOSIS — K2100 Gastro-esophageal reflux disease with esophagitis, without bleeding: Secondary | ICD-10-CM | POA: Diagnosis not present

## 2021-01-10 DIAGNOSIS — D631 Anemia in chronic kidney disease: Secondary | ICD-10-CM | POA: Diagnosis present

## 2021-01-10 DIAGNOSIS — R279 Unspecified lack of coordination: Secondary | ICD-10-CM | POA: Diagnosis not present

## 2021-01-10 DIAGNOSIS — I10 Essential (primary) hypertension: Secondary | ICD-10-CM | POA: Diagnosis not present

## 2021-01-10 DIAGNOSIS — Z992 Dependence on renal dialysis: Secondary | ICD-10-CM

## 2021-01-10 DIAGNOSIS — F17210 Nicotine dependence, cigarettes, uncomplicated: Secondary | ICD-10-CM | POA: Diagnosis present

## 2021-01-10 DIAGNOSIS — R4701 Aphasia: Secondary | ICD-10-CM | POA: Diagnosis present

## 2021-01-10 DIAGNOSIS — I6930 Unspecified sequelae of cerebral infarction: Secondary | ICD-10-CM | POA: Diagnosis not present

## 2021-01-10 DIAGNOSIS — E440 Moderate protein-calorie malnutrition: Secondary | ICD-10-CM | POA: Diagnosis present

## 2021-01-10 DIAGNOSIS — Z794 Long term (current) use of insulin: Secondary | ICD-10-CM

## 2021-01-10 DIAGNOSIS — Z79899 Other long term (current) drug therapy: Secondary | ICD-10-CM

## 2021-01-10 DIAGNOSIS — Z833 Family history of diabetes mellitus: Secondary | ICD-10-CM

## 2021-01-10 DIAGNOSIS — E875 Hyperkalemia: Principal | ICD-10-CM

## 2021-01-10 DIAGNOSIS — I69920 Aphasia following unspecified cerebrovascular disease: Secondary | ICD-10-CM

## 2021-01-10 DIAGNOSIS — E11649 Type 2 diabetes mellitus with hypoglycemia without coma: Secondary | ICD-10-CM | POA: Diagnosis present

## 2021-01-10 DIAGNOSIS — N2581 Secondary hyperparathyroidism of renal origin: Secondary | ICD-10-CM | POA: Diagnosis present

## 2021-01-10 DIAGNOSIS — I69951 Hemiplegia and hemiparesis following unspecified cerebrovascular disease affecting right dominant side: Secondary | ICD-10-CM | POA: Diagnosis not present

## 2021-01-10 DIAGNOSIS — I120 Hypertensive chronic kidney disease with stage 5 chronic kidney disease or end stage renal disease: Secondary | ICD-10-CM | POA: Diagnosis present

## 2021-01-10 DIAGNOSIS — Z823 Family history of stroke: Secondary | ICD-10-CM

## 2021-01-10 DIAGNOSIS — I499 Cardiac arrhythmia, unspecified: Secondary | ICD-10-CM | POA: Diagnosis not present

## 2021-01-10 DIAGNOSIS — Z743 Need for continuous supervision: Secondary | ICD-10-CM | POA: Diagnosis not present

## 2021-01-10 DIAGNOSIS — E119 Type 2 diabetes mellitus without complications: Secondary | ICD-10-CM | POA: Diagnosis not present

## 2021-01-10 DIAGNOSIS — Z8249 Family history of ischemic heart disease and other diseases of the circulatory system: Secondary | ICD-10-CM

## 2021-01-10 DIAGNOSIS — Z9115 Patient's noncompliance with renal dialysis: Secondary | ICD-10-CM

## 2021-01-10 DIAGNOSIS — N189 Chronic kidney disease, unspecified: Secondary | ICD-10-CM | POA: Diagnosis present

## 2021-01-10 DIAGNOSIS — Z20822 Contact with and (suspected) exposure to covid-19: Secondary | ICD-10-CM | POA: Diagnosis present

## 2021-01-10 DIAGNOSIS — Z681 Body mass index (BMI) 19 or less, adult: Secondary | ICD-10-CM | POA: Diagnosis not present

## 2021-01-10 DIAGNOSIS — N250 Renal osteodystrophy: Secondary | ICD-10-CM | POA: Diagnosis not present

## 2021-01-10 DIAGNOSIS — E1122 Type 2 diabetes mellitus with diabetic chronic kidney disease: Secondary | ICD-10-CM | POA: Diagnosis present

## 2021-01-10 DIAGNOSIS — E1140 Type 2 diabetes mellitus with diabetic neuropathy, unspecified: Secondary | ICD-10-CM | POA: Diagnosis present

## 2021-01-10 DIAGNOSIS — K611 Rectal abscess: Secondary | ICD-10-CM | POA: Diagnosis not present

## 2021-01-10 DIAGNOSIS — E872 Acidosis: Secondary | ICD-10-CM | POA: Diagnosis present

## 2021-01-10 DIAGNOSIS — R110 Nausea: Secondary | ICD-10-CM | POA: Diagnosis not present

## 2021-01-10 DIAGNOSIS — G8929 Other chronic pain: Secondary | ICD-10-CM | POA: Diagnosis present

## 2021-01-10 LAB — COMPREHENSIVE METABOLIC PANEL
ALT: 17 U/L (ref 0–44)
AST: 24 U/L (ref 15–41)
Albumin: 3.2 g/dL — ABNORMAL LOW (ref 3.5–5.0)
Alkaline Phosphatase: 147 U/L — ABNORMAL HIGH (ref 38–126)
BUN: 214 mg/dL — ABNORMAL HIGH (ref 8–23)
CO2: <7 mmol/L — ABNORMAL LOW (ref 22–32)
Calcium: 7.7 mg/dL — ABNORMAL LOW (ref 8.9–10.3)
Chloride: 109 mmol/L (ref 98–111)
Creatinine, Ser: 24.82 mg/dL — ABNORMAL HIGH (ref 0.61–1.24)
GFR, Estimated: 2 mL/min — ABNORMAL LOW (ref >60)
Glucose, Bld: 63 mg/dL — ABNORMAL LOW (ref 70–99)
Potassium: 7.2 mmol/L (ref 3.5–5.1)
Sodium: 144 mmol/L (ref 135–145)
Total Bilirubin: 0.8 mg/dL (ref 0.3–1.2)
Total Protein: 7.5 g/dL (ref 6.5–8.1)

## 2021-01-10 LAB — CBC WITH DIFFERENTIAL/PLATELET
Abs Immature Granulocytes: 0.23 10*3/uL — ABNORMAL HIGH (ref 0.00–0.07)
Basophils Absolute: 0 10*3/uL (ref 0.0–0.1)
Basophils Relative: 0 %
Eosinophils Absolute: 0.2 10*3/uL (ref 0.0–0.5)
Eosinophils Relative: 2 %
HCT: 24.7 % — ABNORMAL LOW (ref 39.0–52.0)
Hemoglobin: 7.7 g/dL — ABNORMAL LOW (ref 13.0–17.0)
Immature Granulocytes: 2 %
Lymphocytes Relative: 8 %
Lymphs Abs: 0.8 10*3/uL (ref 0.7–4.0)
MCH: 31.4 pg (ref 26.0–34.0)
MCHC: 31.2 g/dL (ref 30.0–36.0)
MCV: 100.8 fL — ABNORMAL HIGH (ref 80.0–100.0)
Monocytes Absolute: 0.6 10*3/uL (ref 0.1–1.0)
Monocytes Relative: 6 %
Neutro Abs: 9.2 10*3/uL — ABNORMAL HIGH (ref 1.7–7.7)
Neutrophils Relative %: 82 %
Platelets: 210 10*3/uL (ref 150–400)
RBC: 2.45 MIL/uL — ABNORMAL LOW (ref 4.22–5.81)
RDW: 16.2 % — ABNORMAL HIGH (ref 11.5–15.5)
WBC: 11.1 10*3/uL — ABNORMAL HIGH (ref 4.0–10.5)
nRBC: 1 % — ABNORMAL HIGH (ref 0.0–0.2)

## 2021-01-10 LAB — HEPATITIS B SURFACE ANTIBODY,QUALITATIVE: Hep B S Ab: NONREACTIVE

## 2021-01-10 LAB — CBG MONITORING, ED
Glucose-Capillary: 58 mg/dL — ABNORMAL LOW (ref 70–99)
Glucose-Capillary: 58 mg/dL — ABNORMAL LOW (ref 70–99)
Glucose-Capillary: 70 mg/dL (ref 70–99)

## 2021-01-10 LAB — MRSA PCR SCREENING: MRSA by PCR: NEGATIVE

## 2021-01-10 LAB — RESP PANEL BY RT-PCR (FLU A&B, COVID) ARPGX2
Influenza A by PCR: NEGATIVE
Influenza B by PCR: NEGATIVE
SARS Coronavirus 2 by RT PCR: NEGATIVE

## 2021-01-10 LAB — HEPATITIS B SURFACE ANTIGEN: Hepatitis B Surface Ag: NONREACTIVE

## 2021-01-10 LAB — ABO/RH: ABO/RH(D): O POS

## 2021-01-10 MED ORDER — ADULT MULTIVITAMIN W/MINERALS CH
1.0000 | ORAL_TABLET | Freq: Every day | ORAL | Status: DC
  Administered 2021-01-11: 1 via ORAL
  Filled 2021-01-10: qty 1

## 2021-01-10 MED ORDER — ONDANSETRON HCL 4 MG/2ML IJ SOLN
4.0000 mg | Freq: Four times a day (QID) | INTRAMUSCULAR | Status: DC | PRN
  Administered 2021-01-11: 4 mg via INTRAVENOUS
  Filled 2021-01-10: qty 2

## 2021-01-10 MED ORDER — HEPARIN SODIUM (PORCINE) 5000 UNIT/ML IJ SOLN
5000.0000 [IU] | Freq: Two times a day (BID) | INTRAMUSCULAR | Status: DC
  Administered 2021-01-10 – 2021-01-18 (×14): 5000 [IU] via SUBCUTANEOUS
  Filled 2021-01-10 (×14): qty 1

## 2021-01-10 MED ORDER — CHLORHEXIDINE GLUCONATE CLOTH 2 % EX PADS: 6.0000 | MEDICATED_PAD | Freq: Every day | CUTANEOUS | Status: DC

## 2021-01-10 MED ORDER — CLOPIDOGREL BISULFATE 75 MG PO TABS
75.0000 mg | ORAL_TABLET | Freq: Every day | ORAL | Status: DC
  Administered 2021-01-11 – 2021-01-17 (×7): 75 mg via ORAL
  Filled 2021-01-10 (×8): qty 1

## 2021-01-10 MED ORDER — SODIUM BICARBONATE 650 MG PO TABS
650.0000 mg | ORAL_TABLET | Freq: Two times a day (BID) | ORAL | Status: DC
  Administered 2021-01-10 – 2021-01-12 (×4): 650 mg via ORAL
  Filled 2021-01-10 (×4): qty 1

## 2021-01-10 MED ORDER — CHLORHEXIDINE GLUCONATE CLOTH 2 % EX PADS
6.0000 | MEDICATED_PAD | Freq: Every day | CUTANEOUS | Status: DC
  Administered 2021-01-11 – 2021-01-18 (×7): 6 via TOPICAL

## 2021-01-10 MED ORDER — NEPRO/CARBSTEADY PO LIQD
237.0000 mL | Freq: Three times a day (TID) | ORAL | Status: DC
  Administered 2021-01-11 – 2021-01-14 (×8): 237 mL via ORAL
  Filled 2021-01-10: qty 237

## 2021-01-10 MED ORDER — CALCIUM GLUCONATE-NACL 1-0.675 GM/50ML-% IV SOLN: 1.0000 g | Freq: Once | INTRAVENOUS | Status: DC

## 2021-01-10 MED ORDER — PANTOPRAZOLE SODIUM 40 MG PO TBEC
40.0000 mg | DELAYED_RELEASE_TABLET | Freq: Every day | ORAL | Status: DC
  Administered 2021-01-11 – 2021-01-17 (×7): 40 mg via ORAL
  Filled 2021-01-10 (×8): qty 1

## 2021-01-10 MED ORDER — ONDANSETRON HCL 4 MG PO TABS: 4.0000 mg | ORAL_TABLET | Freq: Four times a day (QID) | ORAL | Status: DC | PRN

## 2021-01-10 MED ORDER — TAMSULOSIN HCL 0.4 MG PO CAPS
0.4000 mg | ORAL_CAPSULE | Freq: Every day | ORAL | Status: DC
  Administered 2021-01-11 – 2021-01-17 (×7): 0.4 mg via ORAL
  Filled 2021-01-10 (×8): qty 1

## 2021-01-10 MED ORDER — SODIUM BICARBONATE 8.4 % IV SOLN: 50.0000 meq | Freq: Once | INTRAVENOUS | Status: DC

## 2021-01-10 MED ORDER — ACETAMINOPHEN 650 MG RE SUPP: 650.0000 mg | Freq: Four times a day (QID) | RECTAL | Status: DC | PRN

## 2021-01-10 MED ORDER — SODIUM ZIRCONIUM CYCLOSILICATE 10 G PO PACK: 10.0000 g | PACK | Freq: Once | ORAL | Status: DC

## 2021-01-10 MED ORDER — ACETAMINOPHEN 325 MG PO TABS: 650.0000 mg | ORAL_TABLET | Freq: Four times a day (QID) | ORAL | Status: DC | PRN

## 2021-01-10 MED ORDER — LORATADINE 10 MG PO TABS
10.0000 mg | ORAL_TABLET | Freq: Every day | ORAL | Status: DC
  Administered 2021-01-11 – 2021-01-17 (×7): 10 mg via ORAL
  Filled 2021-01-10 (×8): qty 1

## 2021-01-10 MED ORDER — PRAVASTATIN SODIUM 40 MG PO TABS
80.0000 mg | ORAL_TABLET | Freq: Every day | ORAL | Status: DC
  Administered 2021-01-10 – 2021-01-17 (×6): 80 mg via ORAL
  Filled 2021-01-10 (×7): qty 2

## 2021-01-10 MED ORDER — DOXEPIN HCL 25 MG PO CAPS
75.0000 mg | ORAL_CAPSULE | Freq: Every day | ORAL | Status: DC
  Administered 2021-01-10 – 2021-01-17 (×6): 75 mg via ORAL
  Filled 2021-01-10 (×10): qty 3

## 2021-01-10 MED ORDER — CALCITRIOL 0.25 MCG PO CAPS
0.2500 ug | ORAL_CAPSULE | Freq: Every day | ORAL | Status: DC
  Administered 2021-01-11 – 2021-01-14 (×4): 0.25 ug via ORAL
  Filled 2021-01-10 (×4): qty 1

## 2021-01-10 MED ORDER — CARVEDILOL 3.125 MG PO TABS
3.1250 mg | ORAL_TABLET | Freq: Two times a day (BID) | ORAL | Status: DC
  Administered 2021-01-11 – 2021-01-17 (×9): 3.125 mg via ORAL
  Filled 2021-01-10 (×12): qty 1

## 2021-01-10 MED ORDER — INSULIN GLARGINE 100 UNIT/ML ~~LOC~~ SOLN: 5.0000 [IU] | Freq: Every day | SUBCUTANEOUS | Status: DC

## 2021-01-10 MED ORDER — LEVETIRACETAM 500 MG PO TABS
500.0000 mg | ORAL_TABLET | Freq: Two times a day (BID) | ORAL | Status: DC
  Administered 2021-01-10 – 2021-01-17 (×14): 500 mg via ORAL
  Filled 2021-01-10 (×15): qty 1

## 2021-01-10 MED ORDER — SULFAMETHOXAZOLE-TRIMETHOPRIM 800-160 MG PO TABS
1.0000 | ORAL_TABLET | ORAL | Status: DC
  Administered 2021-01-11 – 2021-01-16 (×3): 1 via ORAL
  Filled 2021-01-10 (×4): qty 1

## 2021-01-10 MED ORDER — ZOLPIDEM TARTRATE 5 MG PO TABS
5.0000 mg | ORAL_TABLET | Freq: Every evening | ORAL | Status: DC | PRN
  Administered 2021-01-16: 5 mg via ORAL
  Filled 2021-01-10: qty 1

## 2021-01-10 MED ORDER — VITAMIN D 25 MCG (1000 UNIT) PO TABS
2000.0000 [IU] | ORAL_TABLET | Freq: Every morning | ORAL | Status: DC
  Administered 2021-01-11 – 2021-01-16 (×6): 2000 [IU] via ORAL
  Filled 2021-01-10 (×6): qty 2

## 2021-01-10 MED ORDER — BICTEGRAVIR-EMTRICITAB-TENOFOV 50-200-25 MG PO TABS
1.0000 | ORAL_TABLET | Freq: Every day | ORAL | Status: DC
  Administered 2021-01-11 – 2021-01-16 (×6): 1 via ORAL
  Filled 2021-01-10 (×8): qty 1

## 2021-01-10 MED ORDER — DOXEPIN HCL 75 MG PO CAPS
75.0000 mg | ORAL_CAPSULE | Freq: Every day | ORAL | Status: DC
  Filled 2021-01-10: qty 1

## 2021-01-10 NOTE — Progress Notes (Signed)
This nurse spoke with Dr. Hollie Salk. This patient has very poor vasculature. Dr. Hollie Salk gave order for labs to be held tonight and to be drawn in AM with dialysis. Declined to give orders for accessing HD catheter for labs or fluids at this time. Melanie LPN was instructed to pull PIV that was place in restricted L arm with fistula. Notified Melanie LPN who VU. Fran Lowes, RN VAST

## 2021-01-10 NOTE — Consult Note (Addendum)
[facility] Renal Consultation Note    Indication for Consultation:  Management of ESRD/hemodialysis, anemia, hypertension/volume, and secondary hyperparathyroidism.  HPI: Richard [name] is a 65 y.o. male with PMH including ESRD, T2DM, HTN, stroke with residual R arm weakness, and HIV who presents to the ED today with chief complaint of rectal pain for approximately 1 month. Also reported he had not been to dialysis for about a month. ED work up showed CXR without active disease however K+ 7.2, BUN 214, Cr 24.82, Ca 7.7, Alb 3.2 and Hgb 7.7. Nephrology was consulted for urgent HD. Patient reports he stopped going to dialysis about 1 month ago because he felt he did not need dialysis and wanted a second opinion. According to outpatient HD records, he last had dialysis there on  10/02/20. His kidney center was then unable to contact him but was eventually informed by [place] that he had decided to transition to hospice. Patient reports he is actually not interested in hospice and agrees to do dialysis if needed. He reports he has had nausea since this AM due to the pain, but has otherwise been feeling well. Denies SOB, CP, palpitations, dizziness, abdominal pain, edema, AMS, and muscle weakness/twitching. No recent fevers or chills. Reports a good appetite. He has a LUE AVF placed 04/25/21 which feels mature on exam. Per outpatient notes, he had been refusing cannulation and using a TDC, but does agree to let us use it today. Still has a TDC, dressing is soiled but catheter exit site with no erythema or drainage.   Past Medical History:  Diagnosis Date  . Arthritis   . Diabetes mellitus, type II, insulin dependent ([place])   . GERD (gastroesophageal reflux disease)   . Hypertension   . Hypertensive emergency 05/21/2012  . Immune deficiency disorder ([place])   . Migraines   . Neuropathy in diabetes ([place])    bilat feet  . Ruptured lumbar disc   . Stroke [facility]) 2013   residual right  sided weakness (arm>leg)   Past Surgical History:  Procedure Laterality Date  . AV FISTULA PLACEMENT Left 04/25/2020   Procedure: LEFT ARM BRACHIOCEPHALIC ARTERIOVENOUS (AV) FISTULA CREATION;  Surgeon: Rosetta Posner, MD;  Location: [facility];  Service: Vascular;  Laterality: Left;  . IR FLUORO GUIDE CV LINE RIGHT  04/13/2020  . IR US GUIDE VASC ACCESS RIGHT  04/13/2020  . KNEE ARTHROSCOPY Bilateral    Family History  Problem Relation Age of Onset  . Other Mother   . Stroke Father   . Hypertension Father   . Epilepsy Father   . Breast cancer Sister   . Stroke Brother   . Heart attack Brother   . Diabetes Sister    Social History:  reports that he has been smoking cigarettes. He has been smoking about 0.50 packs per day. He has never used smokeless tobacco. He reports current drug use. Drug: Marijuana. He reports that he does not drink alcohol.  ROS: As per HPI otherwise negative.  Physical Exam: Vitals:   01/10/21 1158 01/10/21 1347 01/10/21 1400 01/10/21 1415  BP: (!) 157/81 (!) 150/73 100/82 (!) 104/93  Pulse: 96 90    Resp: '17 14 15 13  ' Temp: 97.9 F (36.6 C)     TempSrc: Oral     SpO2: 100% 94%       General: Well developed, chronically ill appearing male, alert and appears moderately uncomfortable  Head: Normocephalic, atraumatic, sclera non-icteric, mucus membranes are moist. Neck: JVD not  elevated. Lungs: Clear bilaterally to auscultation without wheezes, rales, or rhonchi. Breathing is unlabored on RA Heart: RRR with normal S1, S2. No murmurs, rubs, or gallops appreciated. Abdomen: Soft, non-tender, non-distended with normoactive bowel sounds. No rebound/guarding. No obvious abdominal masses. Musculoskeletal:  Decreased strength noted R arm. Lower extremities: No edema bilateral lower extremities Neuro: Alert and oriented X 3. Moves all extremities spontaneously. Psych:  Responds to questions appropriately with a normal affect. Dialysis Access: LUE AVF + thrill and  bruit, R IJ TDC, dressing is soiled but catheter exit site with no erythema or drainage.    No Known Allergies Prior to Admission medications   Medication Sig Start Date End Date Taking? Authorizing Provider  ACCU-CHEK AVIVA PLUS test strip 1 each by Other route See admin instructions. Use 1 test strip to test blood sugar two-three times daily 04/20/18   [provider]  ACCU-CHEK AVIVA PLUS test strip USE 1 strip TO test blood sugar 2 TIMES DAILY TO 3 TIMES DAILY Patient taking differently: 1 each by Other route in the morning, at noon, and at bedtime.  06/10/19   Koberlein, [name], MD  amLODipine (NORVASC) 10 MG tablet TAKE 1 TABLET BY MOUTH DAILY Patient taking differently: Take 10 mg by mouth daily.  03/28/20   Koberlein, [name], MD  bictegravir-emtricitabine-tenofovir AF (BIKTARVY) 50-200-25 MG TABS tablet Take 1 tablet by mouth daily. 05/01/20   Oswald Hillock, MD  blood glucose meter kit and supplies KIT Dispense based on patient and insurance preference. Check blood sugar 4 times a day Patient taking differently: Inject 1 each into the skin See admin instructions. Dispense based on patient and insurance preference. Check blood sugar 4 times a day 06/11/18   [name], MD  calcitRIOL (ROCALTROL) 0.25 MCG capsule Take 1 capsule (0.25 mcg total) by mouth daily. 03/07/20   [name], MD  calcium acetate (PHOSLO) 667 MG tablet Take 667 mg by mouth 3 (three) times daily. 05/16/20   [provider]  carvedilol (COREG) 3.125 MG tablet Take 1 tablet (3.125 mg total) by mouth 2 (two) times daily with a meal. 05/07/20 06/06/20  Arrien, Jimmy Picket, MD  Cholecalciferol (VITAMIN D) 50 MCG (2000 UT) tablet Take 1 tablet (2,000 Units total) by mouth in the morning. 03/07/20   [name], MD  clopidogrel (PLAVIX) 75 MG tablet Take 1 tablet (75 mg total) by mouth daily. 03/07/20   [name], MD  hydrocortisone cream 1 % Apply 1 application topically 4  (four) times daily as needed for itching.    [provider]  hydrOXYzine (ATARAX/VISTARIL) 25 MG tablet Take 25 mg by mouth 3 (three) times daily. 05/16/20   [provider]  insulin aspart (NOVOLOG) 100 UNIT/ML injection Inject 0-9 Units into the skin 3 (three) times daily with meals. For glucose 150 to 200 use 2 units, for 201 to 250 use 3 units, for 251 to 300 use 5 units, for 301 to 350 use 7 units for 351 units or above use 9 units. 05/07/20 06/06/20  Arrien, Jimmy Picket, MD  insulin glargine (LANTUS) 100 UNIT/ML injection Inject 0.05 mLs (5 Units total) into the skin at bedtime. 05/07/20 06/06/20  Arrien, Jimmy Picket, MD  Insulin Pen Needle (SURE COMFORT PEN NEEDLES) 31G X 8 MM MISC Use as directed twice daily with Novolog flex pen Patient taking differently: 1 each by Other route See admin instructions. Use as directed twice daily with Novolog flex pen 02/10/19  [name], MD  levETIRAcetam (KEPPRA) 500 MG tablet Take 1 tablet (500 mg total) by mouth 2 (two) times daily. 03/07/20 06/01/20  [name], MD  Misc. Devices (COMMODE 3-IN-1) MISC Use as directed 02/24/20   [name], MD  Misc. Devices (TRANSFER BENCH) MISC Use as directed 02/24/20   Koberlein, [name], MD  NARCAN 4 MG/0.1ML LIQD nasal spray kit Place 0.4 mg into the nose once.  05/31/18   [provider]  pantoprazole (PROTONIX) 40 MG tablet TAKE 1 TABLET BY MOUTH EVERY DAY Patient taking differently: Take 40 mg by mouth daily.  01/23/20   [name], MD  pravastatin (PRAVACHOL) 80 MG tablet Take 1 tablet (80 mg total) by mouth at bedtime. 03/07/20   [name], MD  sildenafil (REVATIO) 20 MG tablet Take 20-100 mg by mouth as needed for erectile dysfunction. 03/28/20   [provider]  sulfamethoxazole-trimethoprim (BACTRIM DS) 800-160 MG tablet Take 1 tablet by mouth 3 (three) times a week. 04/23/20   Little Ishikawa, MD  tamsulosin (FLOMAX) 0.4 MG CAPS  capsule TAKE 1 CAPSULE BY MOUTH EVERY DAY Patient taking differently: Take 0.4 mg by mouth daily.  03/28/20   [name], MD  TRUEPLUS INSULIN SYRINGE 31G X 5/16" 1 ML MISC Use pen needle with insulin 2 times daily Patient taking differently: 1 each by Other route in the morning and at bedtime. With insulin 03/31/19   Koberlein, Junell C, MD  zolpidem (AMBIEN) 5 MG tablet Take 5 mg by mouth at bedtime as needed for sleep. 05/16/20   [provider]   Current Facility-Administered Medications  Medication Dose Route Frequency Provider Last Rate Last Admin  . calcium gluconate 1 g/ 50 mL sodium chloride IVPB  1 g Intravenous Once Rayna Sexton, PA-C      . [START ON 01/11/2021] Chlorhexidine Gluconate Cloth 2 % PADS 6 each  6 each Topical Q0600 Lyndle Pang G, PA-C      . sodium bicarbonate injection 50 mEq  50 mEq Intravenous Once Joldersma, Logan, PA-C      . sodium zirconium cyclosilicate (LOKELMA) packet 10 g  10 g Oral Once Rayna Sexton, PA-C       Current Outpatient Medications  Medication Sig Dispense Refill  . ACCU-CHEK AVIVA PLUS test strip 1 each by Other route See admin instructions. Use 1 test strip to test blood sugar two-three times daily  1  . ACCU-CHEK AVIVA PLUS test strip USE 1 strip TO test blood sugar 2 TIMES DAILY TO 3 TIMES DAILY (Patient taking differently: 1 each by Other route in the morning, at noon, and at bedtime. ) 100 strip 1  . amLODipine (NORVASC) 10 MG tablet TAKE 1 TABLET BY MOUTH DAILY (Patient taking differently: Take 10 mg by mouth daily. ) 30 tablet 0  . bictegravir-emtricitabine-tenofovir AF (BIKTARVY) 50-200-25 MG TABS tablet Take 1 tablet by mouth daily. 30 tablet 0  . blood glucose meter kit and supplies KIT Dispense based on patient and insurance preference. Check blood sugar 4 times a day (Patient taking differently: Inject 1 each into the skin See admin instructions. Dispense based on patient and insurance preference. Check blood  sugar 4 times a day) 1 each 0  . calcitRIOL (ROCALTROL) 0.25 MCG capsule Take 1 capsule (0.25 mcg total) by mouth daily. 30 capsule 0  . calcium acetate (PHOSLO) 667 MG tablet Take 667 mg by mouth 3 (three) times daily.    . carvedilol (COREG)  3.125 MG tablet Take 1 tablet (3.125 mg total) by mouth 2 (two) times daily with a meal. 60 tablet 0  . Cholecalciferol (VITAMIN D) 50 MCG (2000 UT) tablet Take 1 tablet (2,000 Units total) by mouth in the morning. 30 tablet 0  . clopidogrel (PLAVIX) 75 MG tablet Take 1 tablet (75 mg total) by mouth daily. 90 tablet 0  . hydrocortisone cream 1 % Apply 1 application topically 4 (four) times daily as needed for itching.    . hydrOXYzine (ATARAX/VISTARIL) 25 MG tablet Take 25 mg by mouth 3 (three) times daily.    . insulin aspart (NOVOLOG) 100 UNIT/ML injection Inject 0-9 Units into the skin 3 (three) times daily with meals. For glucose 150 to 200 use 2 units, for 201 to 250 use 3 units, for 251 to 300 use 5 units, for 301 to 350 use 7 units for 351 units or above use 9 units. 8.1 mL 0  . insulin glargine (LANTUS) 100 UNIT/ML injection Inject 0.05 mLs (5 Units total) into the skin at bedtime. 1.5 mL 0  . Insulin Pen Needle (SURE COMFORT PEN NEEDLES) 31G X 8 MM MISC Use as directed twice daily with Novolog flex pen (Patient taking differently: 1 each by Other route See admin instructions. Use as directed twice daily with Novolog flex pen) 100 each 3  . levETIRAcetam (KEPPRA) 500 MG tablet Take 1 tablet (500 mg total) by mouth 2 (two) times daily. 60 tablet 0  . Misc. Devices (COMMODE 3-IN-1) MISC Use as directed 1 each 0  . Misc. Devices (TRANSFER BENCH) MISC Use as directed 1 each 0  . NARCAN 4 MG/0.1ML LIQD nasal spray kit Place 0.4 mg into the nose once.     . pantoprazole (PROTONIX) 40 MG tablet TAKE 1 TABLET BY MOUTH EVERY DAY (Patient taking differently: Take 40 mg by mouth daily. ) 90 tablet 0  . pravastatin (PRAVACHOL) 80 MG tablet Take 1 tablet (80 mg  total) by mouth at bedtime. 90 tablet 0  . sildenafil (REVATIO) 20 MG tablet Take 20-100 mg by mouth as needed for erectile dysfunction.    . sulfamethoxazole-trimethoprim (BACTRIM DS) 800-160 MG tablet Take 1 tablet by mouth 3 (three) times a week. 30 tablet 0  . tamsulosin (FLOMAX) 0.4 MG CAPS capsule TAKE 1 CAPSULE BY MOUTH EVERY DAY (Patient taking differently: Take 0.4 mg by mouth daily. ) 30 capsule 0  . TRUEPLUS INSULIN SYRINGE 31G X 5/16" 1 ML MISC Use pen needle with insulin 2 times daily (Patient taking differently: 1 each by Other route in the morning and at bedtime. With insulin) 100 each 1  . zolpidem (AMBIEN) 5 MG tablet Take 5 mg by mouth at bedtime as needed for sleep.     Labs: Basic Metabolic Panel: Recent Labs  Lab 01/10/21 1255  NA 144  K 7.2*  CL 109  CO2 <7*  GLUCOSE 63*  BUN 214*  CREATININE 24.82*  CALCIUM 7.7*   Liver Function Tests: Recent Labs  Lab 01/10/21 1255  AST 24  ALT 17  ALKPHOS 147*  BILITOT 0.8  PROT 7.5  ALBUMIN 3.2*   CBC: Recent Labs  Lab 01/10/21 1255  WBC 11.1*  NEUTROABS 9.2*  HGB 7.7*  HCT 24.7*  MCV 100.8*  PLT 210   CBG: Recent Labs  Lab 01/10/21 1247 01/10/21 1309 01/10/21 1353  GLUCAP 58* 58* 70   Studies/Results: DG Pelvis 1-2 Views  Result Date: 01/10/2021 CLINICAL DATA:  Pelvic pain. No reported injury.  06/18/2009. EXAM: PELVIS - 1-2 VIEW COMPARISON:  Abdomen and pelvis CT dated 08/09/2020. FINDINGS: Normal appearing pelvic bones. Unremarkable hips. Atheromatous arterial calcifications. IMPRESSION: Normal appearing hips. Electronically Signed   By: Claudie Revering M.D.   On: 01/10/2021 14:45   DG Chest Portable 1 View  Result Date: 01/10/2021 CLINICAL DATA:  Missed dialysis.  Rule out fluid overload EXAM: PORTABLE CHEST 1 VIEW COMPARISON:  08/08/2020 FINDINGS: Right jugular dialysis catheter tip at the cavoatrial junction unchanged. Cardiac enlargement. Normal vascularity. Negative for edema or effusion.  Negative for pneumonia. IMPRESSION: No active disease. Electronically Signed   By: Franchot Gallo M.D.   On: 01/10/2021 13:46    Most recent Dialysis Orders:  Center[facility]- last HD 10/02/20. TueThuSat, 4 hrs 0 min, 180NRe Optiflux, BFR 400, DFR Manual 800 mL/min, EDW 75 (kg), Dialysate 2.0 K, 2.0 Ca Heparin 2000 unit bolus LUE AVF- refused cannulation, was using TDC Was receiving hectorol 40mg q HD and mircera 512m q 2 weeks  Assessment/Plan: 1.  Noncompliance with dialysis: Pt last had HD on 10/02/20 per outpatient records and was poorly compliant prior to this, often shortening/missing treatments. He felt he did not need dialysis. Presenting with K+ 7.2, BUN 214, Cr 24.82. Remarkably feeling ok except for rectal pain. Explained that his labs are life threatening and options are either comfort measures or dialysis. Agrees to restart HD today, will need to slowly titrate to full Rx to prevent dysequilibrium syndrome. Agrees to cannulate AVF. [facility].  2. Hyperkalemia: K+ 7.2 due to missed HD. Using 1K bath today and will plan for serial HD tomorrow to lower potassium further.   3. Rectal abscess: Chief complaint on presentation to the ED. Management per primary team.  4.  Hypertension/volume: Volume controlled, still makes urine. Will keep even on HD today. Resume home BP meds.  5.  Anemia: Hgb 7.7. Was on ESA prior to stopping dialysis. Will plan to resume ESA and check iron panel as well.  6.  Metabolic bone disease: Need to reestablish protocols. Will check phosphorus and PTH levels.  7.  Nutrition:  Albumin 3.2. Recommend renal diet, fluid restrictions and protein supplementation.   SaAnice PaganiniPA-C 01/10/2021, 3:09 PM  [facility] Pager: [phone]

## 2021-01-10 NOTE — ED Provider Notes (Signed)
[facility] EMERGENCY DEPARTMENT Provider Note   CSN: [id number] Arrival date & time: 01/10/21  1155     History Chief Complaint  Patient presents with  . Abscess    Richard [name] is a 65 y.o. male.  HPI   Patient is a 65 year old male with a history of HIV, ESRD on HD, CVA, hypertension, type 2 diabetes mellitus, who presents the emergency department due to rectal pain.  Patient states his symptoms started about 1 month ago.  Also reports an episode of vomiting that occurred this morning.  No current nausea or vomiting.  No diarrhea.  No rectal bleeding.  No chest pain or shortness of breath.  Patient states that he resides at [facility] and has not received hemodialysis for nearly 1 month.  He states "he does not need it".  Confirms his history of HIV and states that he is regularly taking Biktarvy.  No other complaints.     Past Medical History:  Diagnosis Date  . Arthritis   . Diabetes mellitus, type II, insulin dependent ([place])   . GERD (gastroesophageal reflux disease)   . Hypertension   . Hypertensive emergency 05/21/2012  . Immune deficiency disorder ([place])   . Migraines   . Neuropathy in diabetes ([place])    bilat feet  . Ruptured lumbar disc   . Stroke [facility]) 2013   residual right sided weakness (arm>leg)    Patient Active Problem List   Diagnosis Date Noted  . 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  . Syncope 06/01/2020  . Secondary hyperparathyroidism of renal origin ([place]) 05/28/2020  . COVID-19 05/16/2020  . Hypokalemia 05/16/2020  . Allergy, unspecified, initial encounter 05/08/2020  . Anaphylactic shock, unspecified, initial encounter 05/08/2020  . Anemia in chronic kidney disease 05/08/2020  . Complication of vascular dialysis catheter 05/08/2020  . Dependence on renal dialysis ([place]) 05/08/2020  . Headache, unspecified 05/08/2020  . Iron deficiency anemia, unspecified 05/08/2020  . Cerebellar stroke  syndrome 05/07/2020  . Encounter for feeding tube placement   . Weakness   . Creatinine elevation   . Goals of care, counseling/discussion   . Hypertensive urgency   . Meningoencephalitis   . Palliative care by specialist   . ESRD (end stage renal disease) ([place])   . AIDS (acquired immune deficiency syndrome) ([place]) 04/13/2020  . Encephalopathy 04/13/2020  . Seizure ([place]) 04/10/2020  . History of cerebrovascular accident (CVA) with residual deficit 03/19/2020  . CKD (chronic kidney disease) stage 5, GFR less than 15 ml/min (HCC) 02/15/2020  . Altered mental status 02/13/2020  . Neuropathy in diabetes ([place])   . Migraines   . Hypertension   . GERD (gastroesophageal reflux disease)   . Diabetes mellitus, type II, insulin dependent ([place])   . Arthritis   . Hyperlipidemia 08/25/2012  . Chronic kidney disease, stage 4 (severe) ([place]) 08/23/2012  . Tobacco abuse 08/23/2012  . Malnutrition of moderate degree ([place]) 08/23/2012  . 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 AV FISTULA PLACEMENT Left 04/25/2020   Procedure: LEFT ARM BRACHIOCEPHALIC ARTERIOVENOUS (AV) FISTULA CREATION;  Surgeon: Rosetta Posner, MD;  Location: [facility];  Service: Vascular;  Laterality: Left;  . IR FLUORO GUIDE CV LINE RIGHT  04/13/2020  . IR US GUIDE VASC ACCESS RIGHT  04/13/2020  . KNEE ARTHROSCOPY Bilateral        Family History  Problem Relation Age of Onset  .  Other Mother   . Stroke Father   . Hypertension Father   . Epilepsy Father   . Breast cancer Sister   . Stroke Brother   . Heart attack Brother   . Diabetes Sister     Social History   Tobacco Use  . Smoking status: Current Every Day Smoker    Packs/day: 0.50    Types: Cigarettes  . Smokeless tobacco: Never Used  Vaping Use  . Vaping Use: Never used  Substance Use Topics  . Alcohol use: No  . Drug use: Yes    Types: Marijuana    Home  Medications Prior to Admission medications   Medication Sig Start Date End Date Taking? Authorizing Provider  ACCU-CHEK AVIVA PLUS test strip 1 each by Other route See admin instructions. Use 1 test strip to test blood sugar two-three times daily 04/20/18   [provider]  ACCU-CHEK AVIVA PLUS test strip USE 1 strip TO test blood sugar 2 TIMES DAILY TO 3 TIMES DAILY Patient taking differently: 1 each by Other route in the morning, at noon, and at bedtime.  06/10/19   Koberlein, [name], MD  amLODipine (NORVASC) 10 MG tablet TAKE 1 TABLET BY MOUTH DAILY Patient taking differently: Take 10 mg by mouth daily.  03/28/20   Koberlein, [name], MD  bictegravir-emtricitabine-tenofovir AF (BIKTARVY) 50-200-25 MG TABS tablet Take 1 tablet by mouth daily. 05/01/20   Oswald Hillock, MD  blood glucose meter kit and supplies KIT Dispense based on patient and insurance preference. Check blood sugar 4 times a day Patient taking differently: Inject 1 each into the skin See admin instructions. Dispense based on patient and insurance preference. Check blood sugar 4 times a day 06/11/18   [name], MD  calcitRIOL (ROCALTROL) 0.25 MCG capsule Take 1 capsule (0.25 mcg total) by mouth daily. 03/07/20   [name], MD  calcium acetate (PHOSLO) 667 MG tablet Take 667 mg by mouth 3 (three) times daily. 05/16/20   [provider]  carvedilol (COREG) 3.125 MG tablet Take 1 tablet (3.125 mg total) by mouth 2 (two) times daily with a meal. 05/07/20 06/06/20  Arrien, Jimmy Picket, MD  Cholecalciferol (VITAMIN D) 50 MCG (2000 UT) tablet Take 1 tablet (2,000 Units total) by mouth in the morning. 03/07/20   [name], MD  clopidogrel (PLAVIX) 75 MG tablet Take 1 tablet (75 mg total) by mouth daily. 03/07/20   [name], MD  hydrocortisone cream 1 % Apply 1 application topically 4 (four) times daily as needed for itching.    [provider]  hydrOXYzine (ATARAX/VISTARIL) 25  MG tablet Take 25 mg by mouth 3 (three) times daily. 05/16/20   [provider]  insulin aspart (NOVOLOG) 100 UNIT/ML injection Inject 0-9 Units into the skin 3 (three) times daily with meals. For glucose 150 to 200 use 2 units, for 201 to 250 use 3 units, for 251 to 300 use 5 units, for 301 to 350 use 7 units for 351 units or above use 9 units. 05/07/20 06/06/20  Arrien, Jimmy Picket, MD  insulin glargine (LANTUS) 100 UNIT/ML injection Inject 0.05 mLs (5 Units total) into the skin at bedtime. 05/07/20 06/06/20  Arrien, Jimmy Picket, MD  Insulin Pen Needle (SURE COMFORT PEN NEEDLES) 31G X 8 MM MISC Use as directed twice daily with Novolog flex pen Patient taking differently: 1 each by Other route See admin instructions. Use as directed twice daily with Novolog flex pen 02/10/19  [name], MD  levETIRAcetam (KEPPRA) 500 MG tablet Take 1 tablet (500 mg total) by mouth 2 (two) times daily. 03/07/20 06/01/20  [name], MD  Misc. Devices (COMMODE 3-IN-1) MISC Use as directed 02/24/20   [name], MD  Misc. Devices (TRANSFER BENCH) MISC Use as directed 02/24/20   Koberlein, [name], MD  NARCAN 4 MG/0.1ML LIQD nasal spray kit Place 0.4 mg into the nose once.  05/31/18   [provider]  pantoprazole (PROTONIX) 40 MG tablet TAKE 1 TABLET BY MOUTH EVERY DAY Patient taking differently: Take 40 mg by mouth daily.  01/23/20   [name], MD  pravastatin (PRAVACHOL) 80 MG tablet Take 1 tablet (80 mg total) by mouth at bedtime. 03/07/20   [name], MD  sildenafil (REVATIO) 20 MG tablet Take 20-100 mg by mouth as needed for erectile dysfunction. 03/28/20   [provider]  sulfamethoxazole-trimethoprim (BACTRIM DS) 800-160 MG tablet Take 1 tablet by mouth 3 (three) times a week. 04/23/20   Little Ishikawa, MD  tamsulosin (FLOMAX) 0.4 MG CAPS capsule TAKE 1 CAPSULE BY MOUTH EVERY DAY Patient taking differently: Take 0.4 mg by mouth daily.  03/28/20    [name], MD  TRUEPLUS INSULIN SYRINGE 31G X 5/16" 1 ML MISC Use pen needle with insulin 2 times daily Patient taking differently: 1 each by Other route in the morning and at bedtime. With insulin 03/31/19   Koberlein, Junell C, MD  zolpidem (AMBIEN) 5 MG tablet Take 5 mg by mouth at bedtime as needed for sleep. 05/16/20   [provider]    Allergies    Patient has no known allergies.  Review of Systems   Review of Systems  All other systems reviewed and are negative. Ten systems reviewed and are negative for acute change, except as noted in the HPI.    Physical Exam Updated Vital Signs BP (!) 104/93   Pulse 90   Temp 97.9 F (36.6 C) (Oral)   Resp 13   SpO2 94%   Physical Exam Vitals and nursing note reviewed.  Constitutional:      General: He is not in acute distress.    Appearance: Normal appearance. He is not ill-appearing, toxic-appearing or diaphoretic.  HENT:     Head: Normocephalic and atraumatic.     Right Ear: External ear normal.     Left Ear: External ear normal.     Nose: Nose normal.     Mouth/Throat:     Mouth: Mucous membranes are moist.     Pharynx: Oropharynx is clear. No oropharyngeal exudate or posterior oropharyngeal erythema.  Eyes:     Extraocular Movements: Extraocular movements intact.  Cardiovascular:     Rate and Rhythm: Normal rate and regular rhythm.     Pulses: Normal pulses.     Heart sounds: Normal heart sounds. No murmur heard. No friction rub. No gallop.      Comments: HD catheter noted to the right anterior chest wall.  No drainage or signs of surrounding cellulitis. Pulmonary:     Effort: Pulmonary effort is normal. No respiratory distress.     Breath sounds: Normal breath sounds. No stridor. No wheezing, rhonchi or rales.  Abdominal:     General: Abdomen is flat.     Palpations: Abdomen is soft.     Tenderness: There is no abdominal tenderness.     Comments: Abdomen is flat, soft, and nontender.   Genitourinary:    Comments:  Nursing chaperone present.  Small nonthrombosed external hemorrhoid noted.  No tenderness overlying the site.  No overlying skin changes.  No cellulitis.  No palpable induration or fluctuance noted along the buttocks or perirectal region. Musculoskeletal:        General: Normal range of motion.     Cervical back: Normal range of motion and neck supple. No tenderness.     Right lower leg: No edema.     Left lower leg: No edema.     Comments: No pedal edema.  Skin:    General: Skin is warm and dry.  Neurological:     General: No focal deficit present.     Mental Status: He is alert and oriented to person, place, and time.  Psychiatric:        Mood and Affect: Mood normal.        Behavior: Behavior normal.    ED Results / Procedures / Treatments   Labs (all labs ordered are listed, but only abnormal results are displayed) Labs Reviewed  COMPREHENSIVE METABOLIC PANEL - Abnormal; Notable for the following components:      Result Value   Potassium 7.2 (*)    CO2 <7 (*)    Glucose, Bld 63 (*)    BUN 214 (*)    Creatinine, Ser 24.82 (*)    Calcium 7.7 (*)    Albumin 3.2 (*)    Alkaline Phosphatase 147 (*)    GFR, Estimated 2 (*)    All other components within normal limits  CBC WITH DIFFERENTIAL/PLATELET - Abnormal; Notable for the following components:   WBC 11.1 (*)    RBC 2.45 (*)    Hemoglobin 7.7 (*)    HCT 24.7 (*)    MCV 100.8 (*)    RDW 16.2 (*)    nRBC 1.0 (*)    Neutro Abs 9.2 (*)    Abs Immature Granulocytes 0.23 (*)    All other components within normal limits  CBG MONITORING, ED - Abnormal; Notable for the following components:   Glucose-Capillary 58 (*)    All other components within normal limits  CBG MONITORING, ED - Abnormal; Notable for the following components:   Glucose-Capillary 58 (*)    All other components within normal limits  RESP PANEL BY RT-PCR (FLU A&B, COVID) ARPGX2  URINALYSIS, ROUTINE W REFLEX MICROSCOPIC  CBG  MONITORING, ED  TYPE AND SCREEN    EKG EKG Interpretation  Date/Time:  Thursday January 10 2021 12:39:47 EDT Ventricular Rate:  96 PR Interval:  190 QRS Duration: 117 QT Interval:  403 QTC Calculation: 510 R Axis:   46 Text Interpretation: Sinus rhythm Left atrial enlargement Left ventricular hypertrophy Anterior Q waves, possibly due to LVH Abnormal T, consider ischemia, lateral leads Prolonged QT interval unchanged compared with november Confirmed by Noemi Chapel [phone]) on 01/10/2021 12:42:57 PM   Radiology DG Pelvis 1-2 Views  Result Date: 01/10/2021 CLINICAL DATA:  Pelvic pain. No reported injury. 06/18/2009. EXAM: PELVIS - 1-2 VIEW COMPARISON:  Abdomen and pelvis CT dated 08/09/2020. FINDINGS: Normal appearing pelvic bones. Unremarkable hips. Atheromatous arterial calcifications. IMPRESSION: Normal appearing hips. Electronically Signed   By: Claudie Revering M.D.   On: 01/10/2021 14:45   DG Chest Portable 1 View  Result Date: 01/10/2021 CLINICAL DATA:  Missed dialysis.  Rule out fluid overload EXAM: PORTABLE CHEST 1 VIEW COMPARISON:  08/08/2020 FINDINGS: Right jugular dialysis catheter tip at the cavoatrial junction unchanged. Cardiac enlargement. Normal vascularity. Negative for edema or effusion. Negative for  pneumonia. IMPRESSION: No active disease. Electronically Signed   By: Franchot Gallo M.D.   On: 01/10/2021 13:46    Procedures Procedures   Medications Ordered in ED Medications  sodium zirconium cyclosilicate (LOKELMA) packet 10 g (has no administration in time range)  sodium bicarbonate injection 50 mEq (has no administration in time range)  calcium gluconate 1 g/ 50 mL sodium chloride IVPB (has no administration in time range)    ED Course  I have reviewed the triage vital signs and the nursing notes.  Pertinent labs & imaging results that were available during my care of the patient were reviewed by me and considered in my medical decision making (see chart for  details).  Clinical Course as of 01/10/21 1452  Thu Jan 10, 2021  1331 Glucose-Capillary(!): 58 Pt given food and juice. [LJ]  1434 Potassium(!!): 7.2 [LJ]  1438 Patient reassessed.  Discussed his lab results and him likely requiring both admission as well as emergent dialysis.  He is in agreement to both.  We will obtain a type and screen given his additional decrease in hemoglobin.  Will discuss with the medicine team and nephrology. [LJ]  8811 Patient discussed with Dr. Melvia Heaps with nephrology.  They will consult on the patient. [LJ]    Clinical Course User Index [LJ] Rayna Sexton, PA-C   MDM Rules/Calculators/A&P                          Pt is a 66 y.o. male who presents emergency department from [facility] after more than 1 month of noncompliance with hemodialysis.  Labs: CBC with leukocytosis of 11.1, MCV of 100.8, RDW 16.2, neutrophils of 9.2. CMP with a sodium of 144, potassium of 7.2, CO2 less than 7, BUN of 214, creatinine of 24.82 with a GFR of 2. UA pending. Respiratory panel negative. Repeat CBG of 70 after initial hypoglycemia.  Imaging: Chest x-ray is negative.  I, Rayna Sexton, PA-C, personally reviewed and evaluated these images and lab results as part of my medical decision-making.  Patient has not been compliant with hemodialysis for more than a month.  He tells me "I do not need it".  Discussed his laboratory results today and the significance of them.  He understands the significant risk of death with not being hemodialyzed.  He states he is now amenable to admission and hemodialysis at this time.  Will give patient Lokelma, bicarb, as well as calcium gluconate.  Patient discussed with Dr. Jonnie Finner with nephrology who will consult on the patient.  We will discussed with the medicine team for admission and further work-up.  Note: Portions of this report may have been transcribed using voice recognition software. Every effort was made to ensure accuracy;  however, inadvertent computerized transcription errors may be present.   Final Clinical Impression(s) / ED Diagnoses Final diagnoses:  Dialysis patient, noncompliant ([place])  Hyperkalemia   Rx / DC Orders ED Discharge Orders    None       Rayna Sexton, PA-C 01/10/21 1452    Noemi Chapel, MD 01/11/21 [phone]

## 2021-01-10 NOTE — ED Triage Notes (Signed)
Pt here from facility with reports of abscess to buttock. Pt states it has been there 1 month. Pt has also been refusing dialysis X1 month. Pt reasoning for missing dialysis was that he "doesn't need it".

## 2021-01-10 NOTE — H&P (Signed)
History and Physical    Richard [name] SPQ:[id number] DOB: 06-Apr-1956 DOA: 01/10/2021  PCP: [name], MD (Confirm with patient/family/NH records and if not entered, this has to be entered at [facility] point of entry) Patient coming from: Home  I have personally briefly reviewed patient's old medical records in [place]  Chief Complaint: Ractal pain  HPI: Richard [name] is a 65 y.o. male with medical history significant of CKD stage V with recently starting HD via a right chest tunneled catheter, IDDM, HTN, HLD, HIV on HAART, seizure disorder came with worsening rectal pain for 4+ weeks.  Patient claimed that he stopped HD 2+ months ago for "feeling tired of it, and I do not think I need dialysis any more, I made good urine", he further denied any shortness of breath cough or chest pain.  Since 1 month go he has had frequent episodes of rectal pain, "aching a lot" denies any abdominal pain, denies any blood in stool.  Denies any weight loss.  Patient denied feeling depressed and denied suicidal idea.  ED Course: K 7.2, CO2<7, Cre 24, BUN 214.  Chest x-ray no acute infiltrates.  Review of Systems: As per HPI otherwise 14 point review of systems negative.    Past Medical History:  Diagnosis Date  . Arthritis   . Diabetes mellitus, type II, insulin dependent ([place])   . GERD (gastroesophageal reflux disease)   . Hypertension   . Hypertensive emergency 05/21/2012  . Immune deficiency disorder ([place])   . Migraines   . Neuropathy in diabetes ([place])    bilat feet  . Ruptured lumbar disc   . Stroke [facility]) 2013   residual right sided weakness (arm>leg)    Past Surgical History:  Procedure Laterality Date  . AV FISTULA PLACEMENT Left 04/25/2020   Procedure: LEFT ARM BRACHIOCEPHALIC ARTERIOVENOUS (AV) FISTULA CREATION;  Surgeon: Rosetta Posner, MD;  Location: [place];  Service: Vascular;  Laterality: Left;  . IR FLUORO GUIDE CV LINE RIGHT  04/13/2020  . IR US GUIDE VASC  ACCESS RIGHT  04/13/2020  . KNEE ARTHROSCOPY Bilateral      reports that he has been smoking cigarettes. He has been smoking about 0.50 packs per day. He has never used smokeless tobacco. He reports current drug use. Drug: Marijuana. He reports that he does not drink alcohol.  Allergies  Allergen Reactions  . Oxycodone Nausea And Vomiting    Family History  Problem Relation Age of Onset  . Other Mother   . Stroke Father   . Hypertension Father   . Epilepsy Father   . Breast cancer Sister   . Stroke Brother   . Heart attack Brother   . Diabetes Sister      Prior to Admission medications   Medication Sig Start Date End Date Taking? Authorizing Provider  ACCU-CHEK AVIVA PLUS test strip 1 each by Other route See admin instructions. Use 1 test strip to test blood sugar two-three times daily 04/20/18  Yes [provider]  ACCU-CHEK AVIVA PLUS test strip USE 1 strip TO test blood sugar 2 TIMES DAILY TO 3 TIMES DAILY Patient taking differently: 1 each by Other route in the morning, at noon, and at bedtime. 06/10/19  Yes Koberlein, [name], MD  acetaminophen (TYLENOL) 325 MG tablet Take 650 mg by mouth every 6 (six) hours as needed for moderate pain.   Yes [provider]  bictegravir-emtricitabine-tenofovir AF (BIKTARVY) 50-200-25 MG TABS tablet Take 1 tablet by mouth  daily. 05/01/20  Yes Darrick Meigs, Marge Duncans, MD  blood glucose meter kit and supplies KIT Dispense based on patient and insurance preference. Check blood sugar 4 times a day Patient taking differently: Inject 1 each into the skin See admin instructions. Dispense based on patient and insurance preference. Check blood sugar 4 times a day 06/11/18  Yes Koberlein, Junell C, MD  calcitRIOL (ROCALTROL) 0.25 MCG capsule Take 1 capsule (0.25 mcg total) by mouth daily. 03/07/20  Yes Koberlein, Junell C, MD  cetirizine (ZYRTEC) 10 MG tablet Take 10 mg by mouth daily.   Yes [provider]  Cholecalciferol (VITAMIN D) 50  MCG (2000 UT) tablet Take 1 tablet (2,000 Units total) by mouth in the morning. 03/07/20  Yes Koberlein, [name], MD  clopidogrel (PLAVIX) 75 MG tablet Take 1 tablet (75 mg total) by mouth daily. 03/07/20  Yes Koberlein, Junell C, MD  doxepin (SINEQUAN) 75 MG capsule Take 75 mg by mouth at bedtime.   Yes [provider]  Insulin Pen Needle (SURE COMFORT PEN NEEDLES) 31G X 8 MM MISC Use as directed twice daily with Novolog flex pen Patient taking differently: 1 each by Other route See admin instructions. Use as directed twice daily with Novolog flex pen 02/10/19  Yes Koberlein, Junell C, MD  levETIRAcetam (KEPPRA) 500 MG tablet Take 1 tablet (500 mg total) by mouth 2 (two) times daily. 03/07/20 06/01/20 Yes [name], MD  Misc. Devices (COMMODE 3-IN-1) MISC Use as directed 02/24/20  Yes Koberlein, [name], MD  Misc. Devices (TRANSFER BENCH) MISC Use as directed 02/24/20  Yes Koberlein, [name], MD  Multiple Vitamin (MULTIVITAMIN WITH MINERALS) TABS tablet Take 1 tablet by mouth daily.   Yes [provider]  NARCAN 4 MG/0.1ML LIQD nasal spray kit Place 0.4 mg into the nose once.  05/31/18  Yes [provider]  Nutritional Supplements (FEEDING SUPPLEMENT, NEPRO CARB STEADY,) LIQD Take 237 mLs by mouth 3 (three) times daily with meals.   Yes [provider]  pantoprazole (PROTONIX) 40 MG tablet TAKE 1 TABLET BY MOUTH EVERY DAY Patient taking differently: Take 40 mg by mouth daily. 01/23/20  Yes Koberlein, Junell C, MD  pravastatin (PRAVACHOL) 80 MG tablet Take 1 tablet (80 mg total) by mouth at bedtime. 03/07/20  Yes Koberlein, Junell C, MD  sulfamethoxazole-trimethoprim (BACTRIM DS) 800-160 MG tablet Take 1 tablet by mouth 3 (three) times a week. 04/23/20  Yes Little Ishikawa, MD  tamsulosin (FLOMAX) 0.4 MG CAPS capsule TAKE 1 CAPSULE BY MOUTH EVERY DAY Patient taking differently: Take 0.4 mg by mouth daily. 03/28/20  Yes Koberlein, Junell C, MD  triamcinolone cream  (KENALOG) 0.1 % Apply 1 application topically 2 (two) times daily.   Yes [provider]  TRUEPLUS INSULIN SYRINGE 31G X 5/16" 1 ML MISC Use pen needle with insulin 2 times daily Patient taking differently: 1 each by Other route in the morning and at bedtime. With insulin 03/31/19  Yes Koberlein, Junell C, MD  zolpidem (AMBIEN) 5 MG tablet Take 5 mg by mouth at bedtime as needed for sleep. 05/16/20  Yes [provider]  carvedilol (COREG) 3.125 MG tablet Take 1 tablet (3.125 mg total) by mouth 2 (two) times daily with a meal. 05/07/20 06/06/20  Arrien, Jimmy Picket, MD  insulin aspart (NOVOLOG) 100 UNIT/ML injection Inject 0-9 Units into the skin 3 (three) times daily with meals. For glucose 150 to 200 use 2 units, for 201 to 250 use 3 units, for  251 to 300 use 5 units, for 301 to 350 use 7 units for 351 units or above use 9 units. 05/07/20 06/06/20  Arrien, Jimmy Picket, MD  insulin glargine (LANTUS) 100 UNIT/ML injection Inject 0.05 mLs (5 Units total) into the skin at bedtime. 05/07/20 06/06/20  Tawni Millers, MD    Physical Exam: Vitals:   01/10/21 1552 01/10/21 1600 01/10/21 1630 01/10/21 1700  BP: (!) 147/49 (!) 158/62 133/61 (!) 155/68  Pulse: 75 93 (!) 101 (!) 109  Resp: 18     Temp:      TempSrc:      SpO2:        Constitutional: NAD, calm, comfortable Vitals:   01/10/21 1552 01/10/21 1600 01/10/21 1630 01/10/21 1700  BP: (!) 147/49 (!) 158/62 133/61 (!) 155/68  Pulse: 75 93 (!) 101 (!) 109  Resp: 18     Temp:      TempSrc:      SpO2:       Eyes: PERRL, lids and conjunctivae normal ENMT: Mucous membranes are moist. Posterior pharynx clear of any exudate or lesions.Normal dentition.  Neck: normal, supple, no masses, no thyromegaly Respiratory: clear to auscultation bilaterally, no wheezing, no crackles. Normal respiratory effort. No accessory muscle use.  Cardiovascular: Regular rate and rhythm, no murmurs / rubs / gallops. No extremity edema. 2+  pedal pulses. No carotid bruits.  Abdomen: no tenderness, no masses palpated. No hepatosplenomegaly. Bowel sounds positive.  Musculoskeletal: no clubbing / cyanosis. No joint deformity upper and lower extremities. Good ROM, no contractures. Normal muscle tone.  Skin: no rashes, lesions, ulcers. No induration Neurologic: CN 2-12 grossly intact. Sensation intact, DTR normal. Strength 5/5 in all 4.  Psychiatric: Normal judgment and insight. Alert and oriented x 3. Normal mood.     Labs on Admission: I have personally reviewed following labs and imaging studies  CBC: Recent Labs  Lab 01/10/21 1255  WBC 11.1*  NEUTROABS 9.2*  HGB 7.7*  HCT 24.7*  MCV 100.8*  PLT 826   Basic Metabolic Panel: Recent Labs  Lab 01/10/21 1255  NA 144  K 7.2*  CL 109  CO2 <7*  GLUCOSE 63*  BUN 214*  CREATININE 24.82*  CALCIUM 7.7*   GFR: CrCl cannot be calculated (Unknown ideal weight.). Liver Function Tests: Recent Labs  Lab 01/10/21 1255  AST 24  ALT 17  ALKPHOS 147*  BILITOT 0.8  PROT 7.5  ALBUMIN 3.2*   No results for input(s): LIPASE, AMYLASE in the last 168 hours. No results for input(s): AMMONIA in the last 168 hours. Coagulation Profile: No results for input(s): INR, PROTIME in the last 168 hours. Cardiac Enzymes: No results for input(s): CKTOTAL, CKMB, CKMBINDEX, TROPONINI in the last 168 hours. BNP (last 3 results) No results for input(s): PROBNP in the last 8760 hours. HbA1C: No results for input(s): HGBA1C in the last 72 hours. CBG: Recent Labs  Lab 01/10/21 1247 01/10/21 1309 01/10/21 1353  GLUCAP 58* 58* 70   Lipid Profile: No results for input(s): CHOL, HDL, LDLCALC, TRIG, CHOLHDL, LDLDIRECT in the last 72 hours. Thyroid Function Tests: No results for input(s): TSH, T4TOTAL, FREET4, T3FREE, THYROIDAB in the last 72 hours. Anemia Panel: No results for input(s): VITAMINB12, FOLATE, FERRITIN, TIBC, IRON, RETICCTPCT in the last 72 hours. Urine analysis:     Component Value Date/Time   COLORURINE STRAW (A) 04/11/2020 0223   APPEARANCEUR CLEAR 04/11/2020 0223   LABSPEC 1.011 04/11/2020 0223   PHURINE 5.0 04/11/2020 0223   GLUCOSEU  50 (A) 04/11/2020 0223   HGBUR SMALL (A) 04/11/2020 0223   BILIRUBINUR NEGATIVE 04/11/2020 0223   KETONESUR NEGATIVE 04/11/2020 0223   PROTEINUR >=300 (A) 04/11/2020 0223   UROBILINOGEN 0.2 11/29/2014 2003   NITRITE NEGATIVE 04/11/2020 0223   LEUKOCYTESUR NEGATIVE 04/11/2020 0223    Radiological Exams on Admission: DG Pelvis 1-2 Views  Result Date: 01/10/2021 CLINICAL DATA:  Pelvic pain. No reported injury. 06/18/2009. EXAM: PELVIS - 1-2 VIEW COMPARISON:  Abdomen and pelvis CT dated 08/09/2020. FINDINGS: Normal appearing pelvic bones. Unremarkable hips. Atheromatous arterial calcifications. IMPRESSION: Normal appearing hips. Electronically Signed   By: Claudie Revering M.D.   On: 01/10/2021 14:45   DG Chest Portable 1 View  Result Date: 01/10/2021 CLINICAL DATA:  Missed dialysis.  Rule out fluid overload EXAM: PORTABLE CHEST 1 VIEW COMPARISON:  08/08/2020 FINDINGS: Right jugular dialysis catheter tip at the cavoatrial junction unchanged. Cardiac enlargement. Normal vascularity. Negative for edema or effusion. Negative for pneumonia. IMPRESSION: No active disease. Electronically Signed   By: Franchot Gallo M.D.   On: 01/10/2021 13:46    EKG: Independently reviewed. LVH, tented T wave.  Assessment/Plan Active Problems:   Hyperkalemia  (please populate well all problems here in Problem List. (For example, if patient is on BP meds at home and you resume or decide to hold them, it is a problem that needs to be her. Same for CAD, COPD, HLD and so on)  Acute hyperkalemia -Secondary to noncompliant with hemodialysis.  Received cocktail of insulin and D50 in the ED and 1 dose of Lokelma.  Nephrology consulted and ordered emergency dialysis this afternoon. -Repeat BMP tonight.  ESRD on HD -Emergency HD  Acute nongap  metabolic acidosis -Secondary to noncompliance with HD, received bicarb IV x1, start p.o. bicarb supplement.  Uremia and azotemia -Secondary to noncompliance with HD, emergency HD as above.  Acute on chronic anemia likely secondary to CKD -No symptoms or signs of acute bleeding. -Check iron study -Nephro for Epo if indicated.  Chronic rectal pain -CT abd pelvis 07/2020 did not show significant finding, recommend outpatient GI follow-up.  IDDM with hypoglycemia -Discontinue long-acting insulin -Check A1c and start sliding scale.  HIV -Continue Biktarvy and Bactrim DS, and outpatient infectious disease follow-up  Seizure -Continue Keppra.  DVT prophylaxis: Heparin subcu Code Status: Full Code Family Communication: None at bedside Disposition Plan: Expect more than 2 midnight hospital stay for hyperkalemia, suspect 1 more HD session tomorrow. Consults called: Nephrology Admission status: PCU   Lequita Halt MD [facility] Pager [phone]  01/10/2021, 5:22 PM

## 2021-01-10 NOTE — ED Provider Notes (Signed)
This patient is a 65 year old male who has a dialysis catheter, he has been on dialysis in the past.  He presents with a complaint of buttock pain which has been present for a month, it is on his right side, it is over the ischium, on my exam the patient has tenderness over the bony protuberance in that area but there is no redness or overlying induration or cellulitis or fluctuance.  The perirectal area is totally normal, there is a nonthrombosed nontender non-itchy and nonbleeding hemorrhoid present.  The patient has a soft nontender abdomen, his vital signs are consistent with having some hypertension, his mental status is alert and able to answer my questions appropriately.  There is some concern that the patient may have missed enough dialysis that he will need to have lab work to make sure that he does not need immediate dialysis however the patient is adamant that he does not think that he needs it and that he has been told he does not need it.  He is amenable to getting labs drawn.  We will also get a pelvic x-ray to make sure there is no bony abnormalities of the pelvis  Of note the patient did not have any significant findings on his exam to suggest an infectious etiology to his buttock pain however the severe hyperkalemia resulted in multiple stabilizing medications and admission to the hospital with consultation with nephrology for urgent or emergent dialysis given the level of his potassium and the risk for cardiac decline and arrest  Procedures CRITICAL CARE Performed by: Johnna Acosta Total critical care time: 35 minutes Critical care time was exclusive of separately billable procedures and treating other patients. Critical care was necessary to treat or prevent imminent or life-threatening deterioration. Critical care was time spent personally by me on the following activities: development of treatment plan with patient and/or surrogate as well as nursing, discussions with consultants,  evaluation of patient's response to treatment, examination of patient, obtaining history from patient or surrogate, ordering and performing treatments and interventions, ordering and review of laboratory studies, ordering and review of radiographic studies, pulse oximetry and re-evaluation of patient's condition.   Medical screening examination/treatment/procedure(s) were conducted as a shared visit with non-physician practitioner(s) and myself.  I personally evaluated the patient during the encounter.  Clinical Impression:   Final diagnoses:  Dialysis patient, noncompliant ([place])  Hyperkalemia         Noemi Chapel, MD 01/11/21 [phone]

## 2021-01-10 NOTE — Procedures (Signed)
   I was present at this dialysis session, have reviewed the session itself and made  appropriate changes Kelly Splinter MD [place] pager [phone]   01/10/2021, 4:53 PM

## 2021-01-11 DIAGNOSIS — K2100 Gastro-esophageal reflux disease with esophagitis, without bleeding: Secondary | ICD-10-CM

## 2021-01-11 DIAGNOSIS — K649 Unspecified hemorrhoids: Secondary | ICD-10-CM

## 2021-01-11 DIAGNOSIS — E785 Hyperlipidemia, unspecified: Secondary | ICD-10-CM

## 2021-01-11 DIAGNOSIS — Z992 Dependence on renal dialysis: Secondary | ICD-10-CM

## 2021-01-11 DIAGNOSIS — D631 Anemia in chronic kidney disease: Secondary | ICD-10-CM

## 2021-01-11 DIAGNOSIS — Z794 Long term (current) use of insulin: Secondary | ICD-10-CM

## 2021-01-11 DIAGNOSIS — E119 Type 2 diabetes mellitus without complications: Secondary | ICD-10-CM

## 2021-01-11 DIAGNOSIS — I6930 Unspecified sequelae of cerebral infarction: Secondary | ICD-10-CM

## 2021-01-11 DIAGNOSIS — N186 End stage renal disease: Secondary | ICD-10-CM

## 2021-01-11 DIAGNOSIS — E875 Hyperkalemia: Principal | ICD-10-CM

## 2021-01-11 LAB — CBC
HCT: 19.5 % — ABNORMAL LOW (ref 39.0–52.0)
Hemoglobin: 6.6 g/dL — CL (ref 13.0–17.0)
MCH: 31.7 pg (ref 26.0–34.0)
MCHC: 33.8 g/dL (ref 30.0–36.0)
MCV: 93.8 fL (ref 80.0–100.0)
Platelets: 179 10*3/uL (ref 150–400)
RBC: 2.08 MIL/uL — ABNORMAL LOW (ref 4.22–5.81)
RDW: 15.7 % — ABNORMAL HIGH (ref 11.5–15.5)
WBC: 7.3 10*3/uL (ref 4.0–10.5)
nRBC: 1 % — ABNORMAL HIGH (ref 0.0–0.2)

## 2021-01-11 LAB — FERRITIN: Ferritin: 849 ng/mL — ABNORMAL HIGH (ref 24–336)

## 2021-01-11 LAB — GLUCOSE, CAPILLARY
Glucose-Capillary: 25 mg/dL — CL (ref 70–99)
Glucose-Capillary: 35 mg/dL — CL (ref 70–99)
Glucose-Capillary: 70 mg/dL (ref 70–99)
Glucose-Capillary: 109 mg/dL — ABNORMAL HIGH (ref 70–99)
Glucose-Capillary: 92 mg/dL (ref 70–99)
Glucose-Capillary: 76 mg/dL (ref 70–99)
Glucose-Capillary: 76 mg/dL (ref 70–99)
Glucose-Capillary: 80 mg/dL (ref 70–99)

## 2021-01-11 LAB — BASIC METABOLIC PANEL
Anion gap: 23 — ABNORMAL HIGH (ref 5–15)
BUN: 117 mg/dL — ABNORMAL HIGH (ref 8–23)
CO2: 15 mmol/L — ABNORMAL LOW (ref 22–32)
Calcium: 7.9 mg/dL — ABNORMAL LOW (ref 8.9–10.3)
Chloride: 104 mmol/L (ref 98–111)
Creatinine, Ser: 15.22 mg/dL — ABNORMAL HIGH (ref 0.61–1.24)
GFR, Estimated: 3 mL/min — ABNORMAL LOW (ref >60)
Glucose, Bld: 29 mg/dL — CL (ref 70–99)
Potassium: 3.9 mmol/L (ref 3.5–5.1)
Sodium: 142 mmol/L (ref 135–145)

## 2021-01-11 LAB — IRON AND TIBC
Iron: 54 ug/dL (ref 45–182)
Saturation Ratios: 32 % (ref 17.9–39.5)
TIBC: 169 ug/dL — ABNORMAL LOW (ref 250–450)
UIBC: 115 ug/dL

## 2021-01-11 LAB — HEMOGLOBIN AND HEMATOCRIT, BLOOD
HCT: 21.9 % — ABNORMAL LOW (ref 39.0–52.0)
HCT: 26.1 % — ABNORMAL LOW (ref 39.0–52.0)
Hemoglobin: 7.5 g/dL — ABNORMAL LOW (ref 13.0–17.0)
Hemoglobin: 8.9 g/dL — ABNORMAL LOW (ref 13.0–17.0)

## 2021-01-11 LAB — HEPATITIS B SURFACE ANTIBODY, QUANTITATIVE: Hep B S AB Quant (Post): <3.1 m[IU]/mL — ABNORMAL LOW (ref >9.9)

## 2021-01-11 LAB — PREPARE RBC (CROSSMATCH)

## 2021-01-11 MED ORDER — GLUCOSE 40 % PO GEL
ORAL | Status: AC
  Filled 2021-01-11: qty 2

## 2021-01-11 MED ORDER — GLUCOSE 40 % PO GEL
ORAL | Status: AC
  Filled 2021-01-11: qty 1

## 2021-01-11 MED ORDER — SODIUM CHLORIDE 0.9% IV SOLUTION: Freq: Once | INTRAVENOUS | Status: AC

## 2021-01-11 MED ORDER — GLUCOSE 40 % PO GEL
2.0000 | ORAL | Status: AC
  Administered 2021-01-11: 75 g via ORAL

## 2021-01-11 MED ORDER — RENA-VITE PO TABS
1.0000 | ORAL_TABLET | Freq: Every day | ORAL | Status: DC
  Administered 2021-01-11 – 2021-01-17 (×5): 1 via ORAL
  Filled 2021-01-11 (×6): qty 1

## 2021-01-11 MED ORDER — DARBEPOETIN ALFA 60 MCG/0.3ML IJ SOSY
PREFILLED_SYRINGE | INTRAMUSCULAR | Status: AC
  Filled 2021-01-11: qty 0.3

## 2021-01-11 MED ORDER — DARBEPOETIN ALFA 60 MCG/0.3ML IJ SOSY
60.0000 ug | PREFILLED_SYRINGE | INTRAMUSCULAR | Status: DC
  Administered 2021-01-11: 60 ug via INTRAVENOUS

## 2021-01-11 MED ORDER — SODIUM CHLORIDE 0.9% IV SOLUTION: Freq: Once | INTRAVENOUS | Status: DC

## 2021-01-11 NOTE — Progress Notes (Signed)
Mindy R.N. rapid Resp. Aware and Dr. Cyd Silence paged and made aware. Patient has no I.V. access. Patient is awaken and talking see orders for oral Glucose Gel.

## 2021-01-11 NOTE — Progress Notes (Signed)
PROGRESS NOTE    Richard [name]  [id number] DOB: 1956/07/23 DOA: 01/10/2021 PCP: [name], MD    Brief Narrative:  Mr. Richard [name] was admitted to the hospital with the working diagnosis of hyperkalemia in the setting of ESRD on HD   65 year old male past medical history for chronic kidney disease on hemodialysis, type 2 diabetes mellitus, hypertension, dyslipidemia, HIV and seizure disorder who presented with rectal pain. Patient reported stopping hemodialysis about 2 months ago, apparently he was feeling well and making urine.  Over the last 4 weeks he experienced rectal pain, which was persistent and worsening, that prompted him to come to the hospital. On his initial physical examination blood pressure 147/49, heart rate 74, respiratory rate 18, his lungs are clear to auscultation bilaterally, heart S1-S2, present, rhythmic, soft abdomen, no lower extremity edema.  Sodium 144, potassium 7.2, chloride 109, bicarb less than 7, glucose 63, BUN 214, creatinine 24.8, white count 11.1, hemoglobin 7.7, hematocrit 24.7, platelets 210. SARS COVID-19 negative.  Chest radiograph with cardiomegaly, hilar vascular congestion bilaterally.  EKG 96 bpm, normal axis, QTC 510, sinus rhythm, Q-wave V1-V2, no significant ST segment or T wave changes, positive LVH.  Patient underwent emergent hemodialysis with correction of hyperkalemia.   Assessment & Plan:   Principal Problem:   Hyperkalemia Active Problems:   Hyperlipidemia   Hypertension   GERD (gastroesophageal reflux disease)   Diabetes mellitus, type II, insulin dependent (HCC)   History of cerebrovascular accident (CVA) with residual deficit   ESRD (end stage renal disease) ([name])   Anemia in chronic kidney disease   Hemorrhoids   1. Acute hyperkalemia in the setting of ESRD non compliant with HD. Patient had urgent HD on admission, follow up K is down to 3.9, bicarb is 15, continue to have very elevated BU 117.  HD  access tunneled catheter at the right IJ.   Patient agrees to continue with renal replacement therapy, will follow with nephrology for further recommendations for inpatient HD. Today euvolemic, acidosis has improved, and neurologically stable despite elevated BUN.  Continue with oral sodium bicarbonate.   Anemia of chronic renal disease, iron stores with transferrin saturation of 32 with ferritin 849, TIBC 169 and serum iron of 54. Will need epo therapy. Plan for PRBC transfusion on next HD session.   Metabolic bone disease. Continue with calcitriol.   2. Rectal pain. No clinical signs of abscess. Likely rectal pain due to hemorrhoids, will add topical steroids and stool softener.   3. T2DM uncontrolled, hypoglycemia. dyslipidemia Capillary glucose has been low down to 25 to 92, Continue to hold on insulin therapy and encourage po intake.  Consult nutrition for supplements.  Liberate diet for now.   Continue with pravastatin.   4. HIV. Continue with retro viral therapy. No clinical signs of oppurtunistic infections.   Bactrim for PJP prophylaxis.   5. Seizure/ hx of CVA. Continue with Keppra.  Continue with clopidogrel and statin therapy,.   6. HTN. Continue blood pressure control with carvedilol.   7. Depression. Continue with doxepin.   Patient continue to be at high risk for worsening uremia.   Status is: Inpatient  Remains inpatient appropriate because:Hemodynamically unstable and Inpatient level of care appropriate due to severity of illness   Dispo: The patient is from: Home              Anticipated d/c is to: Home              Patient currently is not  medically stable to d/c.   Difficult to place patient No   DVT prophylaxis: Heparin   Code Status:   full  Family Communication:  No family at the bedside     Consultants:   Nephrology     Subjective: Patient with no chest pain or dyspnea, no nausea or vomiting, rectal pain has improved, worse with bowel  movements, positive chronic constipation.   Objective: Vitals:   01/11/21 0524 01/11/21 0546 01/11/21 0800 01/11/21 0830  BP: (!) 149/91 (!) 159/92 (!) 156/93 (!) 165/93  Pulse: (!) 112 (!) 112 (!) 107 (!) 110  Resp: '17 16 16 14  '$ Temp: 98.3 F (36.8 C) (!) 97.5 F (36.4 C) 98.2 F (36.8 C) 98.4 F (36.9 C)  TempSrc: Oral Oral Oral Oral  SpO2: 100% 99% 100% 100%  Weight:        Intake/Output Summary (Last 24 hours) at 01/11/2021 0945 Last data filed at 01/11/2021 0830 Gross per 24 hour  Intake 1045 ml  Output 0 ml  Net 1045 ml   Filed Weights   01/10/21 2116  Weight: 64.3 kg    Examination:   General: Not in pain or dyspnea. Deconditioned  Neurology: Awake and alert, non focal, slow to respond but not confusion or agitation.  E ENT: positive pallor, no icterus, oral mucosa moist Cardiovascular: No JVD. S1-S2 present, rhythmic, no gallops, rubs, or murmurs. No lower extremity edema. Pulmonary: positive breath sounds bilaterally, with no wheezing, rhonchi or rales. Gastrointestinal. Abdomen soft and non tender Skin. No rashes Musculoskeletal: no joint deformities HD Access right IJ tunneled catheter.     Data Reviewed: I have personally reviewed following labs and imaging studies  CBC: Recent Labs  Lab 01/10/21 1255 01/11/21 0018  WBC 11.1* 7.3  NEUTROABS 9.2*  --   HGB 7.7* 6.6*  HCT 24.7* 19.5*  MCV 100.8* 93.8  PLT 210 0000000   Basic Metabolic Panel: Recent Labs  Lab 01/10/21 1255 01/11/21 0018  NA 144 142  K 7.2* 3.9  CL 109 104  CO2 <7* 15*  GLUCOSE 63* 29*  BUN 214* 117*  CREATININE 24.82* 15.22*  CALCIUM 7.7* 7.9*   GFR: Estimated Creatinine Clearance: 4.5 mL/min (A) (by C-G formula based on SCr of 15.22 mg/dL (H)). Liver Function Tests: Recent Labs  Lab 01/10/21 1255  AST 24  ALT 17  ALKPHOS 147*  BILITOT 0.8  PROT 7.5  ALBUMIN 3.2*   No results for input(s): LIPASE, AMYLASE in the last 168 hours. No results for input(s): AMMONIA  in the last 168 hours. Coagulation Profile: No results for input(s): INR, PROTIME in the last 168 hours. Cardiac Enzymes: No results for input(s): CKTOTAL, CKMB, CKMBINDEX, TROPONINI in the last 168 hours. BNP (last 3 results) No results for input(s): PROBNP in the last 8760 hours. HbA1C: No results for input(s): HGBA1C in the last 72 hours. CBG: Recent Labs  Lab 01/11/21 0153 01/11/21 0236 01/11/21 0341 01/11/21 0629 01/11/21 0801  GLUCAP 25* 35* 70 109* 92   Lipid Profile: No results for input(s): CHOL, HDL, LDLCALC, TRIG, CHOLHDL, LDLDIRECT in the last 72 hours. Thyroid Function Tests: No results for input(s): TSH, T4TOTAL, FREET4, T3FREE, THYROIDAB in the last 72 hours. Anemia Panel: Recent Labs    01/11/21 0018  FERRITIN 849*  TIBC 169*  IRON 54      Radiology Studies: I have reviewed all of the imaging during this hospital visit personally     Scheduled Meds: . bictegravir-emtricitabine-tenofovir AF  1 tablet Oral  Daily  . calcitRIOL  0.25 mcg Oral Daily  . carvedilol  3.125 mg Oral BID WC  . Chlorhexidine Gluconate Cloth  6 each Topical Q0600  . cholecalciferol  2,000 Units Oral q AM  . clopidogrel  75 mg Oral Daily  . doxepin  75 mg Oral QHS  . feeding supplement (NEPRO CARB STEADY)  237 mL Oral TID WC  . heparin  5,000 Units Subcutaneous Q12H  . levETIRAcetam  500 mg Oral BID  . loratadine  10 mg Oral Daily  . multivitamin with minerals  1 tablet Oral Daily  . pantoprazole  40 mg Oral Daily  . pravastatin  80 mg Oral QHS  . sodium bicarbonate  650 mg Oral BID  . sodium zirconium cyclosilicate  10 g Oral Once  . sulfamethoxazole-trimethoprim  1 tablet Oral Once per day on Mon Wed Fri  . tamsulosin  0.4 mg Oral Daily   Continuous Infusions: . calcium gluconate       LOS: 1 day        Adrin Julian Gerome Apley, MD

## 2021-01-11 NOTE — H&P (Signed)
HOSPITAL MEDICINE OVERNIGHT EVENT NOTE    Notified by nursing patient's hemoglobin has dropped from 7.7 to 6.6.    Nursing reports no evidence of bleeding, chest pain or shortness of breath.  Patient is hemodynamically stable.    1 unit PRBC ordered with post transfusion H/H.    Vernelle Emerald  MD [facility]

## 2021-01-11 NOTE — Progress Notes (Signed)
Lab called with Crit. Glucose of 29 . DId CBG 25 . Awaken patient and he is responsive and taking glucose Gel will. GIven Apple Juice.

## 2021-01-11 NOTE — Progress Notes (Signed)
Pt returned from hemodialysis.  Pt is A&O X4 and neuro intact.  Vitals and CBG taken.  Pt is currently comfortable.

## 2021-01-11 NOTE — Progress Notes (Signed)
Repeat CBG after giving Gel and drinking Apple Juice 35. R.N.aware Repeat Glucose Gel.

## 2021-01-11 NOTE — Progress Notes (Signed)
Patient received via stretcher alert and orin. From Dialysis. Rn aware of arrival.Andl into assess patient. Patient was orin. To room . Patient had a saline lock in left lower forearm on arrival. I.V. team call for new site. And Heather R.N from I.V. team spoke with Doctor on call. And WIll leave I.V. out for now. D.C. Saline lock site unrem. Cath. Intact. Done at 21:40

## 2021-01-11 NOTE — Progress Notes (Signed)
Initial Nutrition Assessment  DOCUMENTATION CODES:   Non-severe (moderate) malnutrition in context of chronic illness  INTERVENTION:   -Continue renal MVI daily -Continue Nepro Shake po TID, each supplement provides 425 kcal and 19 grams protein  NUTRITION DIAGNOSIS:   Moderate Malnutrition related to chronic illness (ESRD on HD) as evidenced by mild fat depletion,moderate fat depletion,moderate muscle depletion,mild muscle depletion.  GOAL:   Patient will meet greater than or equal to 90% of their needs  MONITOR:   Supplement acceptance,PO intake,Labs,Weight trends,Skin,I & O's  REASON FOR ASSESSMENT:   Consult Assessment of nutrition requirement/status  ASSESSMENT:   Uno Schaffter is a 65 y.o. male with medical history significant of CKD stage V with recently starting HD via a right chest tunneled catheter, IDDM, HTN, HLD, HIV on HAART, seizure disorder came with worsening rectal pain for 4+ weeks.  Pt admitted with hyperkalemia.   Reviewed I/O's: +360 ml x 24 hours  Case discussed with RN, who reports pt ate most of his lunch and plans to go back to HD today. He is consuming Nepro supplements well.  Spoke with pt at bedside, who reports fair appetite. He reports he ate 100% of broccoli and rice and "a f ew bites of fish". Pt shares he was eating well PTA and consumes 3 meals per day. When attempted to obtain diet recall, pt continued to repeat "broccoli and fish". Encouraged pt to drink Nepro supplement- he consumed almost half during visit.   Per nephrology notes, EDW 75 kg. Reviewed wt hx; pt has experienced a 9.3% wt loss over the past 5 months. Pt endorses wt loss, but is unsure of how much weight he has lost, UBW of EDW. He reports "it just keeps going down".   Discussed importance of good meal and supplement intake to promote healing.   Lab Results  Component Value Date   HGBA1C 4.7 (L) 06/01/2020   PTA DM medications are 0-9 units insulin aspart TID  with meals and 5 units insulin glargine daily.   Labs reviewed: CBGS: 76-109 (inpatient orders for glycemic control are none).   NUTRITION - FOCUSED PHYSICAL EXAM:  Flowsheet Row Most Recent Value  Orbital Region Moderate depletion  Upper Arm Region Moderate depletion  Thoracic and Lumbar Region Mild depletion  Buccal Region Mild depletion  Temple Region Moderate depletion  Clavicle Bone Region Moderate depletion  Clavicle and Acromion Bone Region Moderate depletion  Scapular Bone Region Moderate depletion  Dorsal Hand Severe depletion  Patellar Region Moderate depletion  Anterior Thigh Region Moderate depletion  Posterior Calf Region Moderate depletion  Edema (RD Assessment) None  Hair Reviewed  Eyes Reviewed  Mouth Reviewed  Skin Reviewed  Nails Reviewed       Diet Order:   Diet Order            Diet renal/carb modified with fluid restriction Diet-HS Snack? Nothing; Fluid restriction: 1200 mL Fluid; Room service appropriate? Yes with Assist; Fluid consistency: Thin  Diet effective now                 EDUCATION NEEDS:   Education needs have been addressed  Skin:  Skin Assessment: Reviewed RN Assessment  Last BM:  Unknown  Height:   Ht Readings from Last 1 Encounters:  08/03/20 6' (1.829 m)    Weight:   Wt Readings from Last 1 Encounters:  01/11/21 65 kg    Ideal Body Weight:  80.9 kg  BMI:  Body mass index is 19.43 kg/m.  Estimated Nutritional Needs:   Kcal:  2100-2300  Protein:  115-130 grams  Fluid:  1000 ml + UOP    Loistine Chance, RD, LDN, [place] Registered Dietitian II Certified Diabetes Care and Education Specialist Please refer to [facility] for RD and/or RD on-call/weekend/after hours pager

## 2021-01-11 NOTE — Progress Notes (Signed)
Text page Dr. Cyd Silence Hbg. 6.6

## 2021-01-11 NOTE — Progress Notes (Signed)
[facility] Progress Note   Subjective:   Noted events of last night: Hypoglycemia, hemoglobin decreased and difficult IV access. Pt seen this AM and reports he is feeling "better." Seems slightly confused, reports dialysis went well but then tells me he did not do dialysis yesterday. Initially refuses further HD today then agrees. Denies SOB, CP, palpitations, abdominal pain and nausea.   Objective Vitals:   01/11/21 0524 01/11/21 0546 01/11/21 0800 01/11/21 0830  BP: (!) 149/91 (!) 159/92 (!) 156/93 (!) 165/93  Pulse: (!) 112 (!) 112 (!) 107 (!) 110  Resp: '17 16 16 14  '$ Temp: 98.3 F (36.8 C) (!) 97.5 F (36.4 C) 98.2 F (36.8 C) 98.4 F (36.9 C)  TempSrc: Oral Oral Oral Oral  SpO2: 100% 99% 100% 100%  Weight:       Physical Exam General: Well developed, chronically ill appearing male, alert and in NAD Heart: RRR, no murmurs, rubs or gallops Lungs: CTA anteriorly without wheezing, rhonchi or rales Abdomen: Soft, non-tender, non-distended, +BS Extremities: No edema b/l lower extremities Dialysis Access:  R IJ TDC with clean, dry bandage. LUE AVF + bruit/thrill  Additional Objective Labs: Basic Metabolic Panel: Recent Labs  Lab 01/10/21 1255 01/11/21 0018  NA 144 142  K 7.2* 3.9  CL 109 104  CO2 <7* 15*  GLUCOSE 63* 29*  BUN 214* 117*  CREATININE 24.82* 15.22*  CALCIUM 7.7* 7.9*   Liver Function Tests: Recent Labs  Lab 01/10/21 1255  AST 24  ALT 17  ALKPHOS 147*  BILITOT 0.8  PROT 7.5  ALBUMIN 3.2*   CBC: Recent Labs  Lab 01/10/21 1255 01/11/21 0018  WBC 11.1* 7.3  NEUTROABS 9.2*  --   HGB 7.7* 6.6*  HCT 24.7* 19.5*  MCV 100.8* 93.8  PLT 210 179   Blood Culture    Component Value Date/Time   SDES CSF 04/14/2020 1827   SPECREQUEST NONE 04/14/2020 1827   CULT  04/14/2020 1827    NO GROWTH 3 DAYS Performed at [facility], [place] [address], [place], [place] [address]    REPTSTATUS 04/18/2020 FINAL 04/14/2020 1827    CBG: Recent Labs  Lab 01/11/21 0153 01/11/21 0236 01/11/21 0341 01/11/21 0629 01/11/21 0801  GLUCAP 25* 35* 70 109* 92   Iron Studies:  Recent Labs    01/11/21 0018  IRON 54  TIBC 169*  FERRITIN 849*   Studies/Results: DG Pelvis 1-2 Views  Result Date: 01/10/2021 CLINICAL DATA:  Pelvic pain. No reported injury. 06/18/2009. EXAM: PELVIS - 1-2 VIEW COMPARISON:  Abdomen and pelvis CT dated 08/09/2020. FINDINGS: Normal appearing pelvic bones. Unremarkable hips. Atheromatous arterial calcifications. IMPRESSION: Normal appearing hips. Electronically Signed   By: Claudie Revering M.D.   On: 01/10/2021 14:45   DG Chest Portable 1 View  Result Date: 01/10/2021 CLINICAL DATA:  Missed dialysis.  Rule out fluid overload EXAM: PORTABLE CHEST 1 VIEW COMPARISON:  08/08/2020 FINDINGS: Right jugular dialysis catheter tip at the cavoatrial junction unchanged. Cardiac enlargement. Normal vascularity. Negative for edema or effusion. Negative for pneumonia. IMPRESSION: No active disease. Electronically Signed   By: Franchot Gallo M.D.   On: 01/10/2021 13:46   Medications: . calcium gluconate     . bictegravir-emtricitabine-tenofovir AF  1 tablet Oral Daily  . calcitRIOL  0.25 mcg Oral Daily  . carvedilol  3.125 mg Oral BID WC  . Chlorhexidine Gluconate Cloth  6 each Topical Q0600  . cholecalciferol  2,000 Units Oral q AM  . clopidogrel  75 mg Oral Daily  . doxepin  75 mg Oral QHS  . feeding supplement (NEPRO CARB STEADY)  237 mL Oral TID WC  . heparin  5,000 Units Subcutaneous Q12H  . levETIRAcetam  500 mg Oral BID  . loratadine  10 mg Oral Daily  . multivitamin with minerals  1 tablet Oral Daily  . pantoprazole  40 mg Oral Daily  . pravastatin  80 mg Oral QHS  . sodium bicarbonate  650 mg Oral BID  . sodium zirconium cyclosilicate  10 g Oral Once  . sulfamethoxazole-trimethoprim  1 tablet Oral Once per day on Mon Wed Fri  . tamsulosin  0.4 mg Oral Daily    Dialysis Orders: Center:  [facility]- last HD 10/02/20. TueThuSat, 4 hrs 0 min, 180NRe Optiflux, BFR 400, DFR Manual 800 mL/min, EDW 75 (kg), Dialysate 2.0 K, 2.0 Ca Heparin 2000 unit bolus LUE AVF- refused cannulation, was using TDC Was receiving hectorol 44mg q HD and mircera 570m q 2 weeks  Assessment/Plan: 1.  Noncompliance with dialysis: Pt last had HD on 10/02/20 per outpatient records and was poorly compliant prior to this, often shortening/missing treatments. He felt he did not need dialysis. Presenting with K+ 7.2, BUN 214, Cr 24.82. Remarkably feeling ok except for rectal pain. Explained that his labs are life threatening and options are either comfort measures or dialysis. Agreed to restart HD, will need to slowly titrate to full Rx to prevent dysequilibrium syndrome. AVF worked well on 4/21. Mildly confused today which may be due to ongoing uremia. Agrees to short dialysis again today. CO2 15, anticipate we will be able to stop PO sodium bicarb once reestablished on HD.  2. Hyperkalemia: K+ 7.2 due to missed HD. Used low K bath with HD yesterday, K+ now at goal.  3. Rectal abscess: Chief complaint on presentation to the ED. Management per primary team.  4.  Hypertension/volume: Volume controlled, still makes urine. BP mildly elevated. Resume home BP meds.  5.  Anemia: Hgb 7.7 > 6.6. Was on ESA prior to stopping dialysis. Tsat 32%. Will restart ESA with HD today.  6.  Metabolic bone disease: Need to reestablish protocols. Will check phosphorus and PTH levels on HD today (patient is a very difficult stick so will obtain labs with HD as much as possible).  7.  Nutrition:  Albumin 3.2. Recommend renal diet, fluid restrictions and protein supplementation.    SaAnice PaganiniPA-C 01/11/2021, 9:58 AM  [facility] Pager: [phone]

## 2021-01-11 NOTE — Progress Notes (Signed)
Attempted to call Margo Aye and [name] to let them know that their father is receiving Blood this a.m.  I read the blood consent entirely to patient Richard [name] and he has agree to receive Blood.  He put an" x " and was unable to sign consent Has right side weakness from previous CVA. It was witness by Jeral Fruit. and myself

## 2021-01-11 NOTE — Progress Notes (Signed)
Rounded on patient today in correlation to missing HD in the outpatient setting. This RN was granted permission by Mr. Safko for conversation. Patient reports that he stopped going to his HD treatments because he was feeling better. This RN spoke with the patient at length about his understanding of why he needed HD and his understanding of what the HD therapy does for his life expectancy. Patient reports his understanding and that he has grandchildren that he would like to be around for. This RN also discussed with the patient his dietary needs and also provided handouts of food that was high in potassium and phosphorous. Patient reports that he will continue to eat right, but that it is hard. Assisted patient with his lunch tray and positioning in the bed. Will continue to follow as appropriate.  Dorthey Sawyer, RN  Dialysis Nurse Coordinator [phone]

## 2021-01-11 NOTE — Progress Notes (Signed)
Patient refuses flu vac males him sick. Also refuse PNA vac.

## 2021-01-11 NOTE — Progress Notes (Signed)
Call from pt's nurse requesting PIV due to hypoglycemia. Limited vascular access options. L arm restricted due to AV/fistula. R arm contracted. Pt reports he has sensation in R arm. PIV established in upper arm with ultrasound. Please consider alternative access if pt will require prolonged intravenous access.

## 2021-01-12 DIAGNOSIS — I10 Essential (primary) hypertension: Secondary | ICD-10-CM

## 2021-01-12 LAB — BPAM RBC
Blood Product Expiration Date: 202204292359
Blood Product Expiration Date: 202205202359
ISSUE DATE / TIME: 202204220515
ISSUE DATE / TIME: 202204221629
Unit Type and Rh: 5100
Unit Type and Rh: 5100

## 2021-01-12 LAB — TYPE AND SCREEN
ABO/RH(D): O POS
Antibody Screen: NEGATIVE
Unit division: 0
Unit division: 0

## 2021-01-12 LAB — GLUCOSE, CAPILLARY
Glucose-Capillary: 105 mg/dL — ABNORMAL HIGH (ref 70–99)
Glucose-Capillary: 107 mg/dL — ABNORMAL HIGH (ref 70–99)
Glucose-Capillary: 142 mg/dL — ABNORMAL HIGH (ref 70–99)
Glucose-Capillary: 241 mg/dL — ABNORMAL HIGH (ref 70–99)
Glucose-Capillary: 89 mg/dL (ref 70–99)
Glucose-Capillary: 92 mg/dL (ref 70–99)

## 2021-01-12 LAB — CBC WITH DIFFERENTIAL/PLATELET
Abs Immature Granulocytes: 0.08 10*3/uL — ABNORMAL HIGH (ref 0.00–0.07)
Basophils Absolute: 0 10*3/uL (ref 0.0–0.1)
Basophils Relative: 0 %
Eosinophils Absolute: 0.2 10*3/uL (ref 0.0–0.5)
Eosinophils Relative: 2 %
HCT: 29.2 % — ABNORMAL LOW (ref 39.0–52.0)
Hemoglobin: 9.9 g/dL — ABNORMAL LOW (ref 13.0–17.0)
Immature Granulocytes: 1 %
Lymphocytes Relative: 12 %
Lymphs Abs: 1 10*3/uL (ref 0.7–4.0)
MCH: 30.7 pg (ref 26.0–34.0)
MCHC: 33.9 g/dL (ref 30.0–36.0)
MCV: 90.4 fL (ref 80.0–100.0)
Monocytes Absolute: 1.1 10*3/uL — ABNORMAL HIGH (ref 0.1–1.0)
Monocytes Relative: 13 %
Neutro Abs: 6.1 10*3/uL (ref 1.7–7.7)
Neutrophils Relative %: 72 %
Platelets: 161 10*3/uL (ref 150–400)
RBC: 3.23 MIL/uL — ABNORMAL LOW (ref 4.22–5.81)
RDW: 16.1 % — ABNORMAL HIGH (ref 11.5–15.5)
WBC: 8.3 10*3/uL (ref 4.0–10.5)
nRBC: 0.8 % — ABNORMAL HIGH (ref 0.0–0.2)

## 2021-01-12 LAB — BASIC METABOLIC PANEL WITH GFR
Anion gap: 14 (ref 5–15)
BUN: 62 mg/dL — ABNORMAL HIGH (ref 8–23)
CO2: 23 mmol/L (ref 22–32)
Calcium: 8.1 mg/dL — ABNORMAL LOW (ref 8.9–10.3)
Chloride: 100 mmol/L (ref 98–111)
Creatinine, Ser: 9.49 mg/dL — ABNORMAL HIGH (ref 0.61–1.24)
GFR, Estimated: 6 mL/min — ABNORMAL LOW
Glucose, Bld: 79 mg/dL (ref 70–99)
Potassium: 3.8 mmol/L (ref 3.5–5.1)
Sodium: 137 mmol/L (ref 135–145)

## 2021-01-12 LAB — PARATHYROID HORMONE, INTACT (NO CA): PTH: 181 pg/mL — ABNORMAL HIGH (ref 15–65)

## 2021-01-12 NOTE — Progress Notes (Signed)
[facility] Progress Note   Subjective:  Pt seen in room, no c/o today. A bit difficult to understand at times due to aphasia.   Objective Vitals:   01/12/21 0650 01/12/21 0652 01/12/21 0834 01/12/21 1211  BP:  (!) 150/83 (!) 153/81 (!) 161/94  Pulse:  87 92 89  Resp: '13 12 15 14  '$ Temp:  98 F (36.7 C) 98.1 F (36.7 C) 98.1 F (36.7 C)  TempSrc:  Oral Oral Oral  SpO2:  100% 100% 93%  Weight: 65.5 kg      Physical Exam General: Well developed, chronically ill appearing male, stable Heart: RRR, no murmurs, rubs or gallops Lungs: CTA anteriorly without wheezing, rhonchi or rales Abdomen: Soft, non-tender, non-distended, +BS Extremities: No edema b/l lower extremities Dialysis Access:  R IJ TDC, LUE AVF + bruit/thrill  Neuro: RUE paresis, aphasia  Additional Objective Labs: Basic Metabolic Panel: Recent Labs  Lab 01/10/21 1255 01/11/21 0018 01/12/21 0201  NA 144 142 137  K 7.2* 3.9 3.8  CL 109 104 100  CO2 <7* 15* 23  GLUCOSE 63* 29* 79  BUN 214* 117* 62*  CREATININE 24.82* 15.22* 9.49*  CALCIUM 7.7* 7.9* 8.1*   Liver Function Tests: Recent Labs  Lab 01/10/21 1255  AST 24  ALT 17  ALKPHOS 147*  BILITOT 0.8  PROT 7.5  ALBUMIN 3.2*   CBC: Recent Labs  Lab 01/10/21 1255 01/11/21 0018 01/11/21 1132 01/11/21 2052 01/12/21 0201  WBC 11.1* 7.3  --   --  8.3  NEUTROABS 9.2*  --   --   --  6.1  HGB 7.7* 6.6* 7.5* 8.9* 9.9*  HCT 24.7* 19.5* 21.9* 26.1* 29.2*  MCV 100.8* 93.8  --   --  90.4  PLT 210 179  --   --  161   Blood Culture    Component Value Date/Time   SDES CSF 04/14/2020 1827   SPECREQUEST NONE 04/14/2020 1827   CULT  04/14/2020 1827    NO GROWTH 3 DAYS Performed at [facility] [address], [place], [place] [address]    REPTSTATUS 04/18/2020 FINAL 04/14/2020 1827   CBG: Recent Labs  Lab 01/11/21 1856 01/11/21 2116 01/12/21 0630 01/12/21 0841 01/12/21 1213  GLUCAP 76 80 92 89 142*   Iron Studies:   Recent Labs    01/11/21 0018  IRON 54  TIBC 169*  FERRITIN 849*   Studies/Results: DG Pelvis 1-2 Views  Result Date: 01/10/2021 CLINICAL DATA:  Pelvic pain. No reported injury. 06/18/2009. EXAM: PELVIS - 1-2 VIEW COMPARISON:  Abdomen and pelvis CT dated 08/09/2020. FINDINGS: Normal appearing pelvic bones. Unremarkable hips. Atheromatous arterial calcifications. IMPRESSION: Normal appearing hips. Electronically Signed   By: Claudie Revering M.D.   On: 01/10/2021 14:45   Medications: . calcium gluconate     . sodium chloride   Intravenous Once  . bictegravir-emtricitabine-tenofovir AF  1 tablet Oral Daily  . calcitRIOL  0.25 mcg Oral Daily  . carvedilol  3.125 mg Oral BID WC  . Chlorhexidine Gluconate Cloth  6 each Topical Q0600  . cholecalciferol  2,000 Units Oral q AM  . clopidogrel  75 mg Oral Daily  . darbepoetin (ARANESP) injection - DIALYSIS  60 mcg Intravenous Q Fri-HD  . doxepin  75 mg Oral QHS  . feeding supplement (NEPRO CARB STEADY)  237 mL Oral TID WC  . heparin  5,000 Units Subcutaneous Q12H  . levETIRAcetam  500 mg Oral BID  . loratadine  10 mg  Oral Daily  . multivitamin  1 tablet Oral QHS  . pantoprazole  40 mg Oral Daily  . pravastatin  80 mg Oral QHS  . sulfamethoxazole-trimethoprim  1 tablet Oral Once per day on Mon Wed Fri  . tamsulosin  0.4 mg Oral Daily    Dialysis Orders: [facility]  4h  400/800   2/2 bath  75kg   Hep 2000  LUA AVF/ TDC  Was receiving hectorol 57mg q HD and mircera 586m q 2 weeks  Assessment/Plan: 1.  Noncompliance with dialysis: Pt started HD in Aug 2021. His last HD prior to admit here was Oct 02 2020.  He was poorly compliant prior to this, often shortening/missing treatments. He felt he did not need dialysis. Presented here w/ BUN 214, Cr 24. We explained that his labs were life threatening. Pt agreed to restart HD. Azotemia better after 2 HD sessions 2.5h each. AVF worked well x 2. Mildly confused today which may baseline (hx aphasia/  CVA). Plan short HD today to get back on schedule. Will need to reCLIP the patient Monday, and also needs TDC removal prior to DC.  2. Chronic debility: w/ R hemiparesis and aphasia sp CVA. WC bound and SNF dependent.   3. Hyperkalemia: K+ 7.2 due to missed HD. Resolved.  4. Rectal abscess: Chief complaint on presentation to the ED. Management per primary team.  5.  Hypertension/volume: Volume controlled, still makes urine. BP mildly elevated. Resume home BP meds.  6.  Anemia: Hgb 7.7 > 6.6. Was on ESA prior to stopping dialysis. Tsat 32%. Will restart ESA with HD today.  7.  Metabolic bone disease: Need to reestablish protocols. Will check phosphorus and PTH levels on HD today (patient is a very difficult stick so will obtain labs with HD as much as possible).  8.  Nutrition:  Albumin 3.2. Recommend renal diet, fluid restrictions and protein supplementation.    RoKelly SplinterMD 01/12/2021, 2:06 PM

## 2021-01-12 NOTE — Progress Notes (Signed)
PROGRESS NOTE    Lorrin Pinsky  [id number] DOB: 04-05-1956 DOA: 01/10/2021 PCP: [name], MD    Brief Narrative:  Mr. Mathison was admitted to the hospital with the working diagnosis of hyperkalemia in the setting of ESRD on HD   65 year old male past medical history for chronic kidney disease on hemodialysis, type 2 diabetes mellitus, hypertension, dyslipidemia, HIV and seizure disorder who presented with rectal pain. Patient reported stopping hemodialysis about 2 months ago, apparently he was feeling well and making urine.  Over the last 4 weeks he experienced rectal pain, which was persistent and worsening, that prompted him to come to the hospital. On his initial physical examination blood pressure 147/49, heart rate 74, respiratory rate 18, his lungs are clear to auscultation bilaterally, heart S1-S2, present, rhythmic, soft abdomen, no lower extremity edema.  Sodium 144, potassium 7.2, chloride 109, bicarb less than 7, glucose 63, BUN 214, creatinine 24.8, white count 11.1, hemoglobin 7.7, hematocrit 24.7, platelets 210. SARS COVID-19 negative.  Chest radiograph with cardiomegaly, hilar vascular congestion bilaterally.  EKG 96 bpm, normal axis, QTC 510, sinus rhythm, Q-wave V1-V2, no significant ST segment or T wave changes, positive LVH.  Patient underwent emergent hemodialysis with correction of hyperkalemia.    Assessment & Plan:   Principal Problem:   Hyperkalemia Active Problems:   Hyperlipidemia   Hypertension   GERD (gastroesophageal reflux disease)   Diabetes mellitus, type II, insulin dependent (HCC)   History of cerebrovascular accident (CVA) with residual deficit   ESRD (end stage renal disease) ([place])   Anemia in chronic kidney disease   Hemorrhoids   1. Acute hyperkalemia in the setting of ESRD non compliant with HD. Clinically improving, had HD for 2 consecutive days.  HD access tunneled catheter at the right IJ.   BUN is down  to 62 with improvement in his mentation, K is 3,8 and serum bicarbonate is 23.    Discontinue oral bicarbonate.   Anemia of chronic renal disease, iron stores with transferrin saturation of 32 with ferritin 849, TIBC 169 and serum iron of 54. Sp one unit PRBC transfusion, follow Hgb is up to 9,9, continue with EPO on HD   Metabolic bone disease. Continue with calcitriol.  Serum calcium is 8,1 will check P in am.  OK to discontinue telemetry.   2. Rectal pain. No clinical signs of abscess. L Continue topical steroids with good toleration.  3. T2DM uncontrolled, hypoglycemia. dyslipidemia  Fasting glucose this am is 79, continue to hold on insulin therapy. Patient tolerating po well, continue capillary glucose monitoring  On pravastatin.   4. HIV. Continue with retro viral therapy. No clinical signs of oppurtunistic infections.   Bactrim for PJP prophylaxis. Will need outpatient follow up.    5. Seizure/ hx of CVA. On Keppra with no signs of active seizures.  Tolerating well clopidogrel and statin therapy,.   6. HTN Continue blood pressure control with carvedilol.   7. Depression. On doxepin.   8. Moderate calorie protein malnutrition. Continue with nutritional supplementation.   Status is: Inpatient  Remains inpatient appropriate because:Inpatient level of care appropriate due to severity of illness   Dispo: The patient is from: Home              Anticipated d/c is to: Home              Patient currently is not medically stable to d/c.   Difficult to place patient No    DVT prophylaxis: Heparin  Code Status:   full  Family Communication:  No family at the bedside      Nutrition Status: Nutrition Problem: Moderate Malnutrition Etiology: chronic illness (ESRD on HD) Signs/Symptoms: mild fat depletion,moderate fat depletion,moderate muscle depletion,mild muscle depletion Interventions: MVI,Nepro shake     Consultants:   Nephrology     Subjective: Patient is feeling better, he is more awake and alert,. Improved po intake, no nausea or vomiting, no chest pain or dyspnea,   Objective: Vitals:   01/12/21 0048 01/12/21 0650 01/12/21 0652 01/12/21 0834  BP: (!) 178/85  (!) 150/83 (!) 153/81  Pulse: 90  87 92  Resp: '14 13 12 15  '$ Temp: 98.1 F (36.7 C)  98 F (36.7 C) 98.1 F (36.7 C)  TempSrc: Oral  Oral Oral  SpO2: 100%  100% 100%  Weight:  65.5 kg      Intake/Output Summary (Last 24 hours) at 01/12/2021 1153 Last data filed at 01/11/2021 1810 Gross per 24 hour  Intake 315 ml  Output 0 ml  Net 315 ml   Filed Weights   01/11/21 1525 01/11/21 1825 01/12/21 0650  Weight: 65 kg 65 kg 65.5 kg    Examination:   General: Not in pain or dyspnea, deconditioned  Neurology: Awake and alert, non focal  E ENT: no pallor, no icterus, oral mucosa moist Cardiovascular: No JVD. S1-S2 present, rhythmic, no gallops, rubs, or murmurs. No lower extremity edema. Pulmonary: positive breath sounds bilaterally, adequate air movement, no wheezing, rhonchi or rales. Gastrointestinal. Abdomen soft and non tender Skin. No rashes Musculoskeletal: no joint deformities     Data Reviewed: I have personally reviewed following labs and imaging studies  CBC: Recent Labs  Lab 01/10/21 1255 01/11/21 0018 01/11/21 1132 01/11/21 2052 01/12/21 0201  WBC 11.1* 7.3  --   --  8.3  NEUTROABS 9.2*  --   --   --  6.1  HGB 7.7* 6.6* 7.5* 8.9* 9.9*  HCT 24.7* 19.5* 21.9* 26.1* 29.2*  MCV 100.8* 93.8  --   --  90.4  PLT 210 179  --   --  [id number]   Basic Metabolic Panel: Recent Labs  Lab 01/10/21 1255 01/11/21 0018 01/12/21 0201  NA 144 142 137  K 7.2* 3.9 3.8  CL 109 104 100  CO2 <7* 15* 23  GLUCOSE 63* 29* 79  BUN 214* 117* 62*  CREATININE 24.82* 15.22* 9.49*  CALCIUM 7.7* 7.9* 8.1*   GFR: Estimated Creatinine Clearance: 7.4 mL/min (A) (by C-G formula based on SCr of 9.49 mg/dL (H)). Liver Function Tests: Recent Labs  Lab  01/10/21 1255  AST 24  ALT 17  ALKPHOS 147*  BILITOT 0.8  PROT 7.5  ALBUMIN 3.2*   No results for input(s): LIPASE, AMYLASE in the last 168 hours. No results for input(s): AMMONIA in the last 168 hours. Coagulation Profile: No results for input(s): INR, PROTIME in the last 168 hours. Cardiac Enzymes: No results for input(s): CKTOTAL, CKMB, CKMBINDEX, TROPONINI in the last 168 hours. BNP (last 3 results) No results for input(s): PROBNP in the last 8760 hours. HbA1C: No results for input(s): HGBA1C in the last 72 hours. CBG: Recent Labs  Lab 01/11/21 1114 01/11/21 1856 01/11/21 2116 01/12/21 0630 01/12/21 0841  GLUCAP 76 76 80 92 89   Lipid Profile: No results for input(s): CHOL, HDL, LDLCALC, TRIG, CHOLHDL, LDLDIRECT in the last 72 hours. Thyroid Function Tests: No results for input(s): TSH, T4TOTAL, FREET4, T3FREE, THYROIDAB in the last 72 hours. Anemia  Panel: Recent Labs    01/11/21 0018  FERRITIN 849*  TIBC 169*  IRON 54      Radiology Studies: I have reviewed all of the imaging during this hospital visit personally     Scheduled Meds: . sodium chloride   Intravenous Once  . bictegravir-emtricitabine-tenofovir AF  1 tablet Oral Daily  . calcitRIOL  0.25 mcg Oral Daily  . carvedilol  3.125 mg Oral BID WC  . Chlorhexidine Gluconate Cloth  6 each Topical Q0600  . cholecalciferol  2,000 Units Oral q AM  . clopidogrel  75 mg Oral Daily  . darbepoetin (ARANESP) injection - DIALYSIS  60 mcg Intravenous Q Fri-HD  . doxepin  75 mg Oral QHS  . feeding supplement (NEPRO CARB STEADY)  237 mL Oral TID WC  . heparin  5,000 Units Subcutaneous Q12H  . levETIRAcetam  500 mg Oral BID  . loratadine  10 mg Oral Daily  . multivitamin  1 tablet Oral QHS  . pantoprazole  40 mg Oral Daily  . pravastatin  80 mg Oral QHS  . sodium bicarbonate  650 mg Oral BID  . sulfamethoxazole-trimethoprim  1 tablet Oral Once per day on Mon Wed Fri  . tamsulosin  0.4 mg Oral Daily    Continuous Infusions: . calcium gluconate       LOS: 2 days        Crystina Borrayo Gerome Apley, MD

## 2021-01-13 LAB — GLUCOSE, CAPILLARY
Glucose-Capillary: 110 mg/dL — ABNORMAL HIGH (ref 70–99)
Glucose-Capillary: 195 mg/dL — ABNORMAL HIGH (ref 70–99)
Glucose-Capillary: 198 mg/dL — ABNORMAL HIGH (ref 70–99)
Glucose-Capillary: 226 mg/dL — ABNORMAL HIGH (ref 70–99)
Glucose-Capillary: 236 mg/dL — ABNORMAL HIGH (ref 70–99)
Glucose-Capillary: 95 mg/dL (ref 70–99)

## 2021-01-13 LAB — HEMOGLOBIN A1C
Hgb A1c MFr Bld: 5.6 % (ref 4.8–5.6)
Mean Plasma Glucose: 114.02 mg/dL

## 2021-01-13 LAB — BASIC METABOLIC PANEL
Anion gap: 12 (ref 5–15)
BUN: 42 mg/dL — ABNORMAL HIGH (ref 8–23)
CO2: 26 mmol/L (ref 22–32)
Calcium: 8.4 mg/dL — ABNORMAL LOW (ref 8.9–10.3)
Chloride: 100 mmol/L (ref 98–111)
Creatinine, Ser: 7.21 mg/dL — ABNORMAL HIGH (ref 0.61–1.24)
GFR, Estimated: 8 mL/min — ABNORMAL LOW (ref >60)
Glucose, Bld: 233 mg/dL — ABNORMAL HIGH (ref 70–99)
Potassium: 3.8 mmol/L (ref 3.5–5.1)
Sodium: 138 mmol/L (ref 135–145)

## 2021-01-13 MED ORDER — INSULIN ASPART 100 UNIT/ML ~~LOC~~ SOLN
0.0000 [IU] | Freq: Three times a day (TID) | SUBCUTANEOUS | Status: DC
  Administered 2021-01-13: 2 [IU] via SUBCUTANEOUS
  Administered 2021-01-14: 3 [IU] via SUBCUTANEOUS
  Administered 2021-01-14: 1 [IU] via SUBCUTANEOUS
  Administered 2021-01-15 (×2): 2 [IU] via SUBCUTANEOUS
  Administered 2021-01-16 – 2021-01-18 (×3): 3 [IU] via SUBCUTANEOUS

## 2021-01-13 NOTE — Progress Notes (Addendum)
Pt refused evening Coreg.  Pt educated on importance of medication.  MD notified.

## 2021-01-13 NOTE — Progress Notes (Signed)
[name] Progress Note   Subjective:  Pt seen in room, no c/o today.   Objective Vitals:   01/13/21 0415 01/13/21 0813 01/13/21 0828 01/13/21 0829  BP: 118/64 (!) 160/90  (!) 159/83  Pulse: 86 96  93  Resp: '16 20 20 16  '$ Temp: 98.4 F (36.9 C) 98.1 F (36.7 C)  98.8 F (37.1 C)  TempSrc: Oral Oral  Oral  SpO2: 100% 100%  100%  Weight:       Physical Exam General: Well developed, chronically ill appearing male, stable Heart: RRR, no murmurs, rubs or gallops Lungs: CTA anteriorly without wheezing, rhonchi or rales Abdomen: Soft, non-tender, non-distended, +BS Extremities: No edema b/l lower extremities Dialysis Access:  R IJ TDC, LUE AVF + bruit/thrill  Neuro: RUE paresis, aphasia  Additional Objective Labs: Basic Metabolic Panel: Recent Labs  Lab 01/11/21 0018 01/12/21 0201 01/13/21 0047  NA 142 137 138  K 3.9 3.8 3.8  CL 104 100 100  CO2 15* 23 26  GLUCOSE 29* 79 233*  BUN 117* 62* 42*  CREATININE 15.22* 9.49* 7.21*  CALCIUM 7.9* 8.1* 8.4*   Liver Function Tests: Recent Labs  Lab 01/10/21 1255  AST 24  ALT 17  ALKPHOS 147*  BILITOT 0.8  PROT 7.5  ALBUMIN 3.2*   CBC: Recent Labs  Lab 01/10/21 1255 01/11/21 0018 01/11/21 1132 01/11/21 2052 01/12/21 0201  WBC 11.1* 7.3  --   --  8.3  NEUTROABS 9.2*  --   --   --  6.1  HGB 7.7* 6.6* 7.5* 8.9* 9.9*  HCT 24.7* 19.5* 21.9* 26.1* 29.2*  MCV 100.8* 93.8  --   --  90.4  PLT 210 179  --   --  161   Blood Culture    Component Value Date/Time   SDES CSF 04/14/2020 1827   SPECREQUEST NONE 04/14/2020 1827   CULT  04/14/2020 1827    NO GROWTH 3 DAYS Performed at [facility] [address] [place], [place] [address]    REPTSTATUS 04/18/2020 FINAL 04/14/2020 1827   CBG: Recent Labs  Lab 01/12/21 1802 01/12/21 2008 01/12/21 2348 01/13/21 0417 01/13/21 0816  GLUCAP 107* 105* 241* 236* 195*   Iron Studies:  Recent Labs    01/11/21 0018  IRON 54  TIBC 169*   FERRITIN 849*   Studies/Results: No results found. Medications:  . sodium chloride   Intravenous Once  . bictegravir-emtricitabine-tenofovir AF  1 tablet Oral Daily  . calcitRIOL  0.25 mcg Oral Daily  . carvedilol  3.125 mg Oral BID WC  . Chlorhexidine Gluconate Cloth  6 each Topical Q0600  . cholecalciferol  2,000 Units Oral q AM  . clopidogrel  75 mg Oral Daily  . darbepoetin (ARANESP) injection - DIALYSIS  60 mcg Intravenous Q Fri-HD  . doxepin  75 mg Oral QHS  . feeding supplement (NEPRO CARB STEADY)  237 mL Oral TID WC  . heparin  5,000 Units Subcutaneous Q12H  . insulin aspart  0-9 Units Subcutaneous TID WC  . levETIRAcetam  500 mg Oral BID  . loratadine  10 mg Oral Daily  . multivitamin  1 tablet Oral QHS  . pantoprazole  40 mg Oral Daily  . pravastatin  80 mg Oral QHS  . sulfamethoxazole-trimethoprim  1 tablet Oral Once per day on Mon Wed Fri  . tamsulosin  0.4 mg Oral Daily    D[facility]: [facility]  4h  400/800   2/2 bath  75kg  Hep 2000  LUA AVF/ TDC  Was receiving hectorol 71mg q HD and mircera 560m q 2 weeks  Assessment/Plan: 1.  Noncompliance with dialysis: Pt started HD in Aug 2021. His last HD prior to admit here was Oct 02 2020.  He was poorly compliant prior to this, often shortening/missing treatments. He felt he did not need dialysis. Presented here w/ BUN 214, Cr 24. We explained that his labs were life threatening. Pt agreed to restart HD. Has had serial HD here x 3 w/ improved azotemia. Vol is okay. Pt mildly confused vs communication issue d/t aphasia. Used [facility] x 3 w/o incident, [facility] Otherwise should be ready for dc from renal standpoint.  2. Chronic debility: w/ R hemiparesis and aphasia sp CVA. WC bound and SNF dependent.   3. Hyperkalemia: K+ 7.2 due to missed HD. Resolved.  4. Rectal abscess: Chief complaint on presentation to the ED. Management per primary  team.  5.  Hypertension/volume: Volume controlled, still makes urine. BP's good, on home coreg.   6.  Anemia: Hgb 7.7 > 6.6. Hb 9's sp transfusion here. Was on ESA prior to stopping dialysis. Tsat 32%. Got 60 ug aranesp here on 4/22. .  7.  Metabolic bone disease: Need to reestablish protocols. Will check phosphorus and PTH levels on HD today (patient is a very difficult stick so will obtain labs with HD as much as possible).  8.  Nutrition:  Albumin 3.2. Recommend renal diet, fluid restrictions and protein supplementation.    RoKelly SplinterMD 01/13/2021, 10:53 AM

## 2021-01-13 NOTE — Progress Notes (Signed)
PROGRESS NOTE    [name]  [id number] DOB: 1956-03-23 DOA: 01/10/2021 PCP: [name], MD    Brief Narrative:  [name] was admitted to the hospital with the working diagnosis of hyperkalemia in the setting of ESRD on HD  65 year old male past medical history for chronic kidney disease on hemodialysis, type 2 diabetes mellitus, hypertension, dyslipidemia, HIV and seizure disorder who presented with rectal pain. Patient reported stopping hemodialysis about 2 months ago, apparently he was feeling well and making urine. Over the last 4 weeks he experienced rectal pain, which was persistent and worsening, that prompted him to come to the hospital. On his initial physical examination blood pressure 147/49, heart rate 74, respiratory rate 18,his lungs are clear to auscultation bilaterally, heart S1-S2, present, rhythmic, soft abdomen, no lower extremity edema.  Sodium 144, potassium 7.2, chloride 109, bicarb less than 7, glucose 63, BUN 214, creatinine 24.8, white count 11.1, hemoglobin 7.7, hematocrit 24.7, platelets 210. SARS COVID-19 negative.  Chest radiograph with cardiomegaly, hilar vascular congestion bilaterally.  EKG 96 bpm, normal axis, QTC 510, sinus rhythm, Q-wave V1-V2, no significant ST segment or T wave changes, positive LVH.  Patient underwent emergent hemodialysis with correction of hyperkalemia.    Assessment & Plan:   Principal Problem:   Hyperkalemia Active Problems:   Hyperlipidemia   Hypertension   GERD (gastroesophageal reflux disease)   Diabetes mellitus, type II, insulin dependent (HCC)   History of cerebrovascular accident (CVA) with residual deficit   ESRD (end stage renal disease) ([place])   Anemia in chronic kidney disease   Hemorrhoids    1. Acute hyperkalemia in the setting of ESRD non compliant with HD. Clinically improving, had HD for 3 consecutive days.  HD access tunneled catheter at the right IJ.   Now  patient back on his schedule T-TH-Sat.  BUN is down to 42, and K at 3,8 with serum bicarbonate 26.  Patient will need outpatient HD unit to be arranged and a permament HD access.   Anemia of chronic renal disease, iron stores with transferrin saturation of 32 with ferritin 849, TIBC 169 and serum iron of 54. Sp one unit PRBC transfusion,  Continue with EPO  Metabolic bone disease. Continue with calcitriol.   2. Rectal pain. No clinical signs of abscess. L On topical steroids with good toleration.  3. T2DM uncontrolled, hypoglycemia/ hyperglycemia. dyslipidemia  Fasting glucose this am up to 233, will resume insulin sliding scale and will calculate insulin requirements before starting long acting insulin. Po intake has improved.  Continue with pravastatin.   4. HIV. Continue with retro viral therapy. No clinical signs of oppurtunistic infections.  Bactrim for PJP prophylaxis.  5. Seizure/ hx of CVA. Continue with Keppra, clinically with no signs of active seizures.  Continue with clopidogrel and statin therapy. Noted mild confusion, possible baseline cognitive impairment, (no encephalopathy)   6. HTN On carvedilol.   7. Depression. Continue with doxepin.  8. Moderate calorie protein malnutrition. On nutritional supplementation.    Status is: Inpatient  Remains inpatient appropriate because:Inpatient level of care appropriate due to severity of illness   Dispo: The patient is from: Home              Anticipated d/c is to: Home              Patient currently is not medically stable to d/c.   Difficult to place patient No  DVT prophylaxis: Heparin   Code Status:   full  Family  Communication:  No family at the bedside      Nutrition Status: Nutrition Problem: Moderate Malnutrition Etiology: chronic illness (ESRD on HD) Signs/Symptoms: mild fat depletion,moderate fat depletion,moderate muscle depletion,mild muscle depletion Interventions: MVI,Nepro shake      Consultants:   Nephrology    Subjective: Patient is feeling better, no nausea or vomiting, no dyspnea or chest pain. Mild confusion.   Objective: Vitals:   01/13/21 0415 01/13/21 0813 01/13/21 0828 01/13/21 0829  BP: 118/64 (!) 160/90  (!) 159/83  Pulse: 86 96  93  Resp: '16 20 20 16  '$ Temp: 98.4 F (36.9 C) 98.1 F (36.7 C)  98.8 F (37.1 C)  TempSrc: Oral Oral  Oral  SpO2: 100% 100%  100%  Weight:        Intake/Output Summary (Last 24 hours) at 01/13/2021 0850 Last data filed at 01/12/2021 1714 Gross per 24 hour  Intake --  Output 1000 ml  Net -1000 ml   Filed Weights   01/12/21 0650 01/12/21 1435 01/12/21 1714  Weight: 65.5 kg 64.5 kg 63.8 kg    Examination:   General: deconditioned  Neurology: Awake and alert, non focal  E ENT: no pallor, no icterus, oral mucosa moist Cardiovascular: No JVD. S1-S2 present, rhythmic, no gallops, rubs, or murmurs. No lower extremity edema. Pulmonary: positive breath sounds bilaterally, adequate air movement, no wheezing, rhonchi or rales. Gastrointestinal. Abdomen soft and non tender Skin. No rashes Musculoskeletal: no joint deformities Right IJ tunneled HD catheter.     Data Reviewed: I have personally reviewed following labs and imaging studies  CBC: Recent Labs  Lab 01/10/21 1255 01/11/21 0018 01/11/21 1132 01/11/21 2052 01/12/21 0201  WBC 11.1* 7.3  --   --  8.3  NEUTROABS 9.2*  --   --   --  6.1  HGB 7.7* 6.6* 7.5* 8.9* 9.9*  HCT 24.7* 19.5* 21.9* 26.1* 29.2*  MCV 100.8* 93.8  --   --  90.4  PLT 210 179  --   --  [id number]   Basic Metabolic Panel: Recent Labs  Lab 01/10/21 1255 01/11/21 0018 01/12/21 0201 01/13/21 0047  NA 144 142 137 138  K 7.2* 3.9 3.8 3.8  CL 109 104 100 100  CO2 <7* 15* 23 26  GLUCOSE 63* 29* 79 233*  BUN 214* 117* 62* 42*  CREATININE 24.82* 15.22* 9.49* 7.21*  CALCIUM 7.7* 7.9* 8.1* 8.4*   GFR: Estimated Creatinine Clearance: 9.5 mL/min (A) (by C-G formula based on SCr of 7.21  mg/dL (H)). Liver Function Tests: Recent Labs  Lab 01/10/21 1255  AST 24  ALT 17  ALKPHOS 147*  BILITOT 0.8  PROT 7.5  ALBUMIN 3.2*   No results for input(s): LIPASE, AMYLASE in the last 168 hours. No results for input(s): AMMONIA in the last 168 hours. Coagulation Profile: No results for input(s): INR, PROTIME in the last 168 hours. Cardiac Enzymes: No results for input(s): CKTOTAL, CKMB, CKMBINDEX, TROPONINI in the last 168 hours. BNP (last 3 results) No results for input(s): PROBNP in the last 8760 hours. HbA1C: No results for input(s): HGBA1C in the last 72 hours. CBG: Recent Labs  Lab 01/12/21 1802 01/12/21 2008 01/12/21 2348 01/13/21 0417 01/13/21 0816  GLUCAP 107* 105* 241* 236* 195*   Lipid Profile: No results for input(s): CHOL, HDL, LDLCALC, TRIG, CHOLHDL, LDLDIRECT in the last 72 hours. Thyroid Function Tests: No results for input(s): TSH, T4TOTAL, FREET4, T3FREE, THYROIDAB in the last 72 hours. Anemia Panel: Recent Labs    01/11/21  0018  FERRITIN 849*  TIBC 169*  IRON 54      Radiology Studies: I have reviewed all of the imaging during this hospital visit personally     Scheduled Meds: . sodium chloride   Intravenous Once  . bictegravir-emtricitabine-tenofovir AF  1 tablet Oral Daily  . calcitRIOL  0.25 mcg Oral Daily  . carvedilol  3.125 mg Oral BID WC  . Chlorhexidine Gluconate Cloth  6 each Topical Q0600  . cholecalciferol  2,000 Units Oral q AM  . clopidogrel  75 mg Oral Daily  . darbepoetin (ARANESP) injection - DIALYSIS  60 mcg Intravenous Q Fri-HD  . doxepin  75 mg Oral QHS  . feeding supplement (NEPRO CARB STEADY)  237 mL Oral TID WC  . heparin  5,000 Units Subcutaneous Q12H  . levETIRAcetam  500 mg Oral BID  . loratadine  10 mg Oral Daily  . multivitamin  1 tablet Oral QHS  . pantoprazole  40 mg Oral Daily  . pravastatin  80 mg Oral QHS  . sulfamethoxazole-trimethoprim  1 tablet Oral Once per day on Mon Wed Fri  . tamsulosin   0.4 mg Oral Daily   Continuous Infusions: . calcium gluconate       LOS: 3 days        Maleki Hippe Gerome Apley, MD

## 2021-01-14 ENCOUNTER — Inpatient Hospital Stay (HOSPITAL_COMMUNITY): Payer: No Typology Code available for payment source

## 2021-01-14 DIAGNOSIS — K649 Unspecified hemorrhoids: Secondary | ICD-10-CM

## 2021-01-14 HISTORY — PX: IR REMOVAL TUN CV CATH W/O FL: IMG2289

## 2021-01-14 LAB — GLUCOSE, CAPILLARY
Glucose-Capillary: 284 mg/dL — ABNORMAL HIGH (ref 70–99)
Glucose-Capillary: 142 mg/dL — ABNORMAL HIGH (ref 70–99)
Glucose-Capillary: 216 mg/dL — ABNORMAL HIGH (ref 70–99)
Glucose-Capillary: 82 mg/dL (ref 70–99)
Glucose-Capillary: 137 mg/dL — ABNORMAL HIGH (ref 70–99)

## 2021-01-14 LAB — PHOSPHORUS: Phosphorus: 5 mg/dL — ABNORMAL HIGH (ref 2.5–4.6)

## 2021-01-14 LAB — BASIC METABOLIC PANEL
Anion gap: 13 (ref 5–15)
BUN: 59 mg/dL — ABNORMAL HIGH (ref 8–23)
CO2: 21 mmol/L — ABNORMAL LOW (ref 22–32)
Calcium: 8.2 mg/dL — ABNORMAL LOW (ref 8.9–10.3)
Chloride: 103 mmol/L (ref 98–111)
Creatinine, Ser: 9.13 mg/dL — ABNORMAL HIGH (ref 0.61–1.24)
GFR, Estimated: 6 mL/min — ABNORMAL LOW (ref >60)
Glucose, Bld: 271 mg/dL — ABNORMAL HIGH (ref 70–99)
Potassium: 4 mmol/L (ref 3.5–5.1)
Sodium: 137 mmol/L (ref 135–145)

## 2021-01-14 LAB — CBC
HCT: 26.3 % — ABNORMAL LOW (ref 39.0–52.0)
Hemoglobin: 8.6 g/dL — ABNORMAL LOW (ref 13.0–17.0)
MCH: 30.8 pg (ref 26.0–34.0)
MCHC: 32.7 g/dL (ref 30.0–36.0)
MCV: 94.3 fL (ref 80.0–100.0)
Platelets: 140 10*3/uL — ABNORMAL LOW (ref 150–400)
RBC: 2.79 MIL/uL — ABNORMAL LOW (ref 4.22–5.81)
RDW: 16.3 % — ABNORMAL HIGH (ref 11.5–15.5)
WBC: 8.1 10*3/uL (ref 4.0–10.5)
nRBC: 0.9 % — ABNORMAL HIGH (ref 0.0–0.2)

## 2021-01-14 MED ORDER — INSULIN GLARGINE 100 UNIT/ML ~~LOC~~ SOLN
5.0000 [IU] | Freq: Every day | SUBCUTANEOUS | Status: DC
  Administered 2021-01-14 – 2021-01-18 (×5): 5 [IU] via SUBCUTANEOUS
  Filled 2021-01-14 (×5): qty 0.05

## 2021-01-14 MED ORDER — LIDOCAINE HCL (PF) 1 % IJ SOLN
INTRAMUSCULAR | Status: DC | PRN
  Administered 2021-01-14: 10 mL

## 2021-01-14 MED ORDER — LIDOCAINE HCL 1 % IJ SOLN
INTRAMUSCULAR | Status: AC
  Filled 2021-01-14: qty 20

## 2021-01-14 MED ORDER — CHLORHEXIDINE GLUCONATE 4 % EX LIQD
CUTANEOUS | Status: AC
  Filled 2021-01-14: qty 15

## 2021-01-14 NOTE — Progress Notes (Signed)
[facility] Progress Note   Subjective:  Pt seen in room, no c/o today. S/p RIJ TDC removal today.    Objective Vitals:   01/13/21 1944 01/13/21 2352 01/14/21 0420 01/14/21 0807  BP: (!) 159/81 (!) 151/86 (!) 147/76 (!) 146/75  Pulse: 94 90 94 96  Resp: '16 16 16 18  '$ Temp: 98 F (36.7 C) 97.6 F (36.4 C) 97.8 F (36.6 C) 98 F (36.7 C)  TempSrc: Oral Oral Oral Oral  SpO2: 100% 100% 100% 100%  Weight:       Physical Exam General: Well developed, chronically ill appearing male, stable Heart: RRR, no murmurs, rubs or gallops Lungs: CTA anteriorly without wheezing, rhonchi or rales Abdomen: Soft, non-tender, non-distended, +BS Extremities: No edema b/l lower extremities Dialysis Access:  R IJ TDC removed and site c/d/i, LUE AVF + bruit/thrill  Neuro: RUE paresis, can say some words but slow and limited  Additional Objective Labs: Basic Metabolic Panel: Recent Labs  Lab 01/12/21 0201 01/13/21 0047 01/14/21 0115  NA 137 138 137  K 3.8 3.8 4.0  CL 100 100 103  CO2 23 26 21*  GLUCOSE 79 233* 271*  BUN 62* 42* 59*  CREATININE 9.49* 7.21* 9.13*  CALCIUM 8.1* 8.4* 8.2*   Liver Function Tests: Recent Labs  Lab 01/10/21 1255  AST 24  ALT 17  ALKPHOS 147*  BILITOT 0.8  PROT 7.5  ALBUMIN 3.2*   CBC: Recent Labs  Lab 01/10/21 1255 01/11/21 0018 01/11/21 1132 01/11/21 2052 01/12/21 0201  WBC 11.1* 7.3  --   --  8.3  NEUTROABS 9.2*  --   --   --  6.1  HGB 7.7* 6.6* 7.5* 8.9* 9.9*  HCT 24.7* 19.5* 21.9* 26.1* 29.2*  MCV 100.8* 93.8  --   --  90.4  PLT 210 179  --   --  161   Blood Culture    Component Value Date/Time   SDES CSF 04/14/2020 1827   SPECREQUEST NONE 04/14/2020 1827   CULT  04/14/2020 1827    NO GROWTH 3 DAYS Performed at [facility], [address], [place], [place] [address]    REPTSTATUS 04/18/2020 FINAL 04/14/2020 1827   CBG: Recent Labs  Lab 01/13/21 1645 01/13/21 2016 01/13/21 2358 01/14/21 0422  01/14/21 0808  GLUCAP 110* 95 226* 284* 142*   Iron Studies:  No results for input(s): IRON, TIBC, TRANSFERRIN, FERRITIN in the last 72 hours. Studies/Results: No results found. Medications:  . sodium chloride   Intravenous Once  . bictegravir-emtricitabine-tenofovir AF  1 tablet Oral Daily  . calcitRIOL  0.25 mcg Oral Daily  . carvedilol  3.125 mg Oral BID WC  . Chlorhexidine Gluconate Cloth  6 each Topical Q0600  . cholecalciferol  2,000 Units Oral q AM  . clopidogrel  75 mg Oral Daily  . darbepoetin (ARANESP) injection - DIALYSIS  60 mcg Intravenous Q Fri-HD  . doxepin  75 mg Oral QHS  . feeding supplement (NEPRO CARB STEADY)  237 mL Oral TID WC  . heparin  5,000 Units Subcutaneous Q12H  . insulin aspart  0-9 Units Subcutaneous TID WC  . levETIRAcetam  500 mg Oral BID  . loratadine  10 mg Oral Daily  . multivitamin  1 tablet Oral QHS  . pantoprazole  40 mg Oral Daily  . pravastatin  80 mg Oral QHS  . sulfamethoxazole-trimethoprim  1 tablet Oral Once per day on Mon Wed Fri  . tamsulosin  0.4 mg Oral Daily  Dialysis [facility]: [facility]  4h  400/800   2/2 bath  75kg   Hep 2000  LUA AVF/ TDC  Was receiving hectorol 3mg q HD and mircera 540m q 2 weeks  Assessment/Plan: 1.  Noncompliance with dialysis: Pt started HD in Aug 2021. His last HD prior to admit here was Oct 02 2020.  He was poorly compliant prior to this, often shortening/missing treatments. He felt he did not need dialysis. Presented here w/ BUN 214, Cr 24. We explained that his labs were life threatening. Pt agreed to restart HD. Has had serial HD here x 3 w/ improved azotemia. Vol is okay. Pt mildly confused vs communication issue d/t aphasia. Used AVF x 3 w/o incident, [facility] Pt will need to have new dialysis unit arranged prior to DC - SW aware.  2. Chronic debility: w/ R hemiparesis and aphasia sp CVA. WC bound and SNF dependent.   3. Rectal abscess: Chief complaint on presentation to the ED.  Management per primary team.  4.  Hypertension/volume: Volume controlled, still makes urine. BP's good, on home coreg.   5.  Anemia: Hgb 7.7 > 6.6. Hb 9's sp transfusion here. Was on ESA prior to stopping dialysis. Tsat 32%. Got 60 ug aranesp here on 4/22. .  6.  Metabolic bone disease: Need to reestablish protocols. Will check phosphorus and PTH levels on HD tomorrow (patient is a very difficult stick so will obtain labs with HD as much as possible).  7.  Nutrition:  Albumin 3.2. Recommend renal diet, fluid restrictions and protein supplementation.    LiJannifer HickD [facility] Pager [phone]

## 2021-01-14 NOTE — Social Work (Signed)
CSW confirmed with Renal Navigator /Greg- patient will need HD clinic.  CSW informed SNF/[facility]- will update them once clinic has been established.    TOC will continue to follow and assist with discharge planning.    Thurmond Butts, MSW, LCSW Clinical Social Worker

## 2021-01-14 NOTE — Progress Notes (Signed)
PROGRESS NOTE    Richard [name]  [id number] DOB: March 16, 1956 DOA: 01/10/2021 PCP: Richard Macadam, MD    Brief Narrative:  Mr. Richard [name] was admitted to the hospital with the working diagnosis of hyperkalemia in the setting of ESRD on HD  65 year old male past medical history for chronic kidney disease on hemodialysis, type 2 diabetes mellitus, hypertension, dyslipidemia, HIV and seizure disorder who presented with rectal pain. Patient reported stopping hemodialysis about 2 months ago, apparently he was feeling well and making urine. Over the last 4 weeks he experienced rectal pain, which was persistent and worsening, that prompted him to come to the hospital. On his initial physical examination blood pressure 147/49, heart rate 74, respiratory rate 18,his lungs are clear to auscultation bilaterally, heart S1-S2, present, rhythmic, soft abdomen, no lower extremity edema.  Sodium 144, potassium 7.2, chloride 109, bicarb less than 7, glucose 63, BUN 214, creatinine 24.8, white count 11.1, hemoglobin 7.7, hematocrit 24.7, platelets 210. SARS COVID-19 negative.  Chest radiograph with cardiomegaly, hilar vascular congestion bilaterally.  EKG 96 bpm, normal axis, QTC 510, sinus rhythm, Q-wave V1-V2, no significant ST segment or T wave changes, positive LVH.  Patient underwent emergent hemodialysis with correction of hyperkalemia.  Now patient back on HD schedule of T-TH-Sat. Temporary HD catheter removed today. HD access left upper extremity fistula.    Assessment & Plan:   Principal Problem:   Hyperkalemia Active Problems:   Hyperlipidemia   Hypertension   GERD (gastroesophageal reflux disease)   Diabetes mellitus, type II, insulin dependent (HCC)   History of cerebrovascular accident (CVA) with residual deficit   ESRD (end stage renal disease) ([place])   Anemia in chronic kidney disease   Hemorrhoids    1. Acute hyperkalemia in the setting of ESRD non  compliant with HD. HD access tunneled catheter at the right IJ. left upper extremity fistula is mature and functional  His schedule is T-TH-Sat.  Plan to remove tunneled catheter today.    Anemia of chronic renal disease, iron stores with transferrin saturation of 32 with ferritin 849, TIBC 169 and serum iron of 54. Sp one unit PRBC transfusion,  Continue with EPO  Metabolic bone disease. Continue with calcitriol.  Pending outpatient HD unit to be arranged.  Consult PT and OT for evaluation.   2. Rectal pain.No clinical signs of abscess.L Continue with topical steroids with good toleration.  3. T2DM uncontrolled, hypoglycemia/ hyperglycemia. dyslipidemia Fasting glucose this am is 271, with capillary 284 and 142. He is tolerating po well.  Will resume his home dose of basal insulin with glargine 5 units daily.  Continue with insulin sliding scale.   Onpravastatin.   4. HIV. Continue with retro viral therapy. No clinical signs of oppurtunistic infections.  Bactrim for PJP prophylaxis.  5. Seizure/ hx of CVA.OnKeppra, no signs of active seizures.  On clopidogrel and statin therapy.  6. HTNContinue withcarvedilol.   7. Depression.On doxepin.  8. Moderate calorie protein malnutrition. Continue with nutritional supplementation.   Status is: Inpatient  Remains inpatient appropriate because:Inpatient level of care appropriate due to severity of illness   Dispo: The patient is from: Home              Anticipated d/c is to: Home              Patient currently is not medically stable to d/c.   Difficult to place patient No    DVT prophylaxis: Heparin   Code Status:   full  Family Communication:  No family at the bedside      Nutrition Status: Nutrition Problem: Moderate Malnutrition Etiology: chronic illness (ESRD on HD) Signs/Symptoms: mild fat depletion,moderate fat depletion,moderate muscle depletion,mild muscle depletion Interventions:  MVI,Nepro shake    Consultants:   Neurology   IR   Procedures:   Tunneled catheter removal      Subjective: Patient with no chest pain or dyspnea, no confusion or agitation, no nausea or vomiting,.   Objective: Vitals:   01/13/21 1944 01/13/21 2352 01/14/21 0420 01/14/21 0807  BP: (!) 159/81 (!) 151/86 (!) 147/76 (!) 146/75  Pulse: 94 90 94 96  Resp: '16 16 16 18  '$ Temp: 98 F (36.7 C) 97.6 F (36.4 C) 97.8 F (36.6 C) 98 F (36.7 C)  TempSrc: Oral Oral Oral Oral  SpO2: 100% 100% 100% 100%  Weight:        Intake/Output Summary (Last 24 hours) at 01/14/2021 1053 Last data filed at 01/14/2021 0900 Gross per 24 hour  Intake 240 ml  Output 225 ml  Net 15 ml   Filed Weights   01/12/21 0650 01/12/21 1435 01/12/21 1714  Weight: 65.5 kg 64.5 kg 63.8 kg    Examination:   General: Not in pain or dyspnea,  Neurology: Awake and alert, non focal  E ENT: no pallor, no icterus, oral mucosa moist Cardiovascular: No JVD. S1-S2 present, rhythmic, no gallops, rubs, or murmurs. No lower extremity edema. Pulmonary: positive breath sounds bilaterally, adequate air movement, no wheezing, rhonchi or rales. Gastrointestinal. Abdomen soft and non tender Skin. No rashes Musculoskeletal: no joint deformities Left upper extremity fistula,.     Data Reviewed: I have personally reviewed following labs and imaging studies  CBC: Recent Labs  Lab 01/10/21 1255 01/11/21 0018 01/11/21 1132 01/11/21 2052 01/12/21 0201  WBC 11.1* 7.3  --   --  8.3  NEUTROABS 9.2*  --   --   --  6.1  HGB 7.7* 6.6* 7.5* 8.9* 9.9*  HCT 24.7* 19.5* 21.9* 26.1* 29.2*  MCV 100.8* 93.8  --   --  90.4  PLT 210 179  --   --  [id number]   Basic Metabolic Panel: Recent Labs  Lab 01/10/21 1255 01/11/21 0018 01/12/21 0201 01/13/21 0047 01/14/21 0115  NA 144 142 137 138 137  K 7.2* 3.9 3.8 3.8 4.0  CL 109 104 100 100 103  CO2 <7* 15* 23 26 21*  GLUCOSE 63* 29* 79 233* 271*  BUN 214* 117* 62* 42* 59*   CREATININE 24.82* 15.22* 9.49* 7.21* 9.13*  CALCIUM 7.7* 7.9* 8.1* 8.4* 8.2*   GFR: Estimated Creatinine Clearance: 7.5 mL/min (A) (by C-G formula based on SCr of 9.13 mg/dL (H)). Liver Function Tests: Recent Labs  Lab 01/10/21 1255  AST 24  ALT 17  ALKPHOS 147*  BILITOT 0.8  PROT 7.5  ALBUMIN 3.2*   No results for input(s): LIPASE, AMYLASE in the last 168 hours. No results for input(s): AMMONIA in the last 168 hours. Coagulation Profile: No results for input(s): INR, PROTIME in the last 168 hours. Cardiac Enzymes: No results for input(s): CKTOTAL, CKMB, CKMBINDEX, TROPONINI in the last 168 hours. BNP (last 3 results) No results for input(s): PROBNP in the last 8760 hours. HbA1C: Recent Labs    01/13/21 0100  HGBA1C 5.6   CBG: Recent Labs  Lab 01/13/21 1645 01/13/21 2016 01/13/21 2358 01/14/21 0422 01/14/21 0808  GLUCAP 110* 95 226* 284* 142*   Lipid Profile: No results for input(s): CHOL, HDL, LDLCALC, TRIG, CHOLHDL,  LDLDIRECT in the last 72 hours. Thyroid Function Tests: No results for input(s): TSH, T4TOTAL, FREET4, T3FREE, THYROIDAB in the last 72 hours. Anemia Panel: No results for input(s): VITAMINB12, FOLATE, FERRITIN, TIBC, IRON, RETICCTPCT in the last 72 hours.    Radiology Studies: I have reviewed all of the imaging during this hospital visit personally     Scheduled Meds: . sodium chloride   Intravenous Once  . bictegravir-emtricitabine-tenofovir AF  1 tablet Oral Daily  . calcitRIOL  0.25 mcg Oral Daily  . carvedilol  3.125 mg Oral BID WC  . chlorhexidine      . Chlorhexidine Gluconate Cloth  6 each Topical Q0600  . cholecalciferol  2,000 Units Oral q AM  . clopidogrel  75 mg Oral Daily  . darbepoetin (ARANESP) injection - DIALYSIS  60 mcg Intravenous Q Fri-HD  . doxepin  75 mg Oral QHS  . feeding supplement (NEPRO CARB STEADY)  237 mL Oral TID WC  . heparin  5,000 Units Subcutaneous Q12H  . insulin aspart  0-9 Units Subcutaneous TID  WC  . levETIRAcetam  500 mg Oral BID  . lidocaine      . loratadine  10 mg Oral Daily  . multivitamin  1 tablet Oral QHS  . pantoprazole  40 mg Oral Daily  . pravastatin  80 mg Oral QHS  . sulfamethoxazole-trimethoprim  1 tablet Oral Once per day on Mon Wed Fri  . tamsulosin  0.4 mg Oral Daily   Continuous Infusions:   LOS: 4 days        Norbert Malkin Gerome Apley, MD

## 2021-01-14 NOTE — Progress Notes (Signed)
Pt returned to rm 7 from dialysis. VSS. Call bell within reach.  Lavenia Atlas, RN

## 2021-01-14 NOTE — Procedures (Signed)
Pre procedural Dx: ESRD Post procedural Dx: Same  Successful removal of tunneled right IJ HD catheter  EBL: 1 mL No immediate complications.  Dressing to remain x 24 hours then may remove and shower. If bleeding of site occurs hold firm pressure at insertion site and right IJ x 15 minutes, if bleeding persists please call IR PA.  Please see imaging section of Epic for full dictation.  Joaquim Nam PA-C 01/14/2021 10:19 AM

## 2021-01-15 DIAGNOSIS — N189 Chronic kidney disease, unspecified: Secondary | ICD-10-CM

## 2021-01-15 LAB — GLUCOSE, CAPILLARY
Glucose-Capillary: 170 mg/dL — ABNORMAL HIGH (ref 70–99)
Glucose-Capillary: 217 mg/dL — ABNORMAL HIGH (ref 70–99)
Glucose-Capillary: 173 mg/dL — ABNORMAL HIGH (ref 70–99)
Glucose-Capillary: 186 mg/dL — ABNORMAL HIGH (ref 70–99)
Glucose-Capillary: 91 mg/dL (ref 70–99)

## 2021-01-15 LAB — PTH, INTACT AND CALCIUM
Calcium, Total (PTH): 7.7 mg/dL — ABNORMAL LOW (ref 8.6–10.2)
PTH: 106 pg/mL — ABNORMAL HIGH (ref 15–65)

## 2021-01-15 MED ORDER — CHLORHEXIDINE GLUCONATE CLOTH 2 % EX PADS
6.0000 | MEDICATED_PAD | Freq: Every day | CUTANEOUS | Status: DC
  Administered 2021-01-15: 6 via TOPICAL

## 2021-01-15 NOTE — Progress Notes (Signed)
Pt refused taking 2 units of insulin and biktarvy. Saying that " I don't want to take it right now, will take it tonight".  Education giving to pt. MD notified.

## 2021-01-15 NOTE — TOC Initial Note (Signed)
Transition of Care [facility]) - Initial/Assessment Note    Patient Details  Name: Richard [name] MRN: [id number] Date of Birth: 03-17-1956  [facility]) CM/SW Contact:    Vinie Sill, LCSW Phone Number: 01/15/2021, 5:02 PM  Clinical Narrative:                  CSW spoke with patient's daughter,Richard [name]- she confirmed patient is from [place]. CSW informed outpatient HD clinic has been arranged and anticipated d/c is tomorrow.   CSW will continue to follow and assist with discharge planning.  Thurmond Butts, MSW, LCSW Clinical Social Worker   Expected Discharge Plan: Skilled Nursing Facility Barriers to Discharge: Barriers Resolved   Patient Goals and CMS Choice        Expected Discharge Plan and Services Expected Discharge Plan: [place] In-house Referral: Clinical Social Work     Living arrangements for the past 2 months: [place]                                      Prior Living Arrangements/Services Living arrangements for the past 2 months: [place] Lives with:: Self          Need for Family Participation in Patient Care: Yes (Comment) Care giver support system in place?: Yes (comment)      Activities of Daily Living      Permission Sought/Granted                  Emotional Assessment       Orientation: : Oriented to Self,Oriented to Place,Oriented to  Time,Oriented to Situation Alcohol / Substance Use: Not Applicable Psych Involvement: No (comment)  Admission diagnosis:  Hyperkalemia [E87.5] Dialysis patient, noncompliant [facility]) [Z91.15] Patient Active Problem List   Diagnosis Date Noted  . Hemorrhoids 01/11/2021  . Hyperkalemia 01/10/2021  . Dialysis patient, noncompliant ([place])   . 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  . Syncope 06/01/2020  . Secondary hyperparathyroidism of renal origin ([place]) 05/28/2020  . COVID-19 05/16/2020   . Hypokalemia 05/16/2020  . Allergy, unspecified, initial encounter 05/08/2020  . Anaphylactic shock, unspecified, initial encounter 05/08/2020  . Anemia in chronic kidney disease 05/08/2020  . Complication of vascular dialysis catheter 05/08/2020  . Dependence on renal dialysis ([place]) 05/08/2020  . Headache, unspecified 05/08/2020  . Iron deficiency anemia, unspecified 05/08/2020  . Cerebellar stroke syndrome 05/07/2020  . Encounter for feeding tube placement   . Weakness   . Creatinine elevation   . Goals of care, counseling/discussion   . Hypertensive urgency   . Meningoencephalitis   . Palliative care by specialist   . ESRD (end stage renal disease) ([place])   . AIDS (acquired immune deficiency syndrome) ([place]) 04/13/2020  . Encephalopathy 04/13/2020  . Seizure ([place]) 04/10/2020  . History of cerebrovascular accident (CVA) with residual deficit 03/19/2020  . CKD (chronic kidney disease) stage 5, GFR less than 15 ml/min (HCC) 02/15/2020  . Altered mental status 02/13/2020  . Neuropathy in diabetes ([place])   . Migraines   . Hypertension   . GERD (gastroesophageal reflux disease)   . Diabetes mellitus, type II, insulin dependent ([place])   . Arthritis   . Hyperlipidemia 08/25/2012  . Chronic kidney disease, stage 4 (severe) ([place]) 08/23/2012  . Tobacco abuse 08/23/2012  . Malnutrition of moderate degree ([place]) 08/23/2012  . 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 [place] [place]Phone: [phone]: [phone]    Social Determinants of Health ([place] Interventions    Readmission Risk Interventions Readmission Risk Prevention Plan 02/15/2020  Transportation Screening  Complete  PCP or Specialist Appt within 5-7 Days Not Complete  Not Complete comments pending disposition  Home Care Screening Complete  Medication Review (RN CM) Referral to Pharmacy  Some recent data might be hidden

## 2021-01-15 NOTE — Progress Notes (Signed)
[facility] Progress Note   Subjective:   No concerns today. Denies SOB, CP, palpitations, dizziness, abdominal pain and nausea. TDC removed yesterday  Objective Vitals:   01/14/21 2100 01/14/21 2326 01/15/21 0427 01/15/21 0936  BP: 137/75 (!) 141/68 (!) 156/83 136/69  Pulse: 95 98 96 87  Resp: '20 17 19 17  '$ Temp: 98.3 F (36.8 C) 98.3 F (36.8 C) 97.6 F (36.4 C) 98.5 F (36.9 C)  TempSrc: Oral Oral Oral Oral  SpO2: 99% 98% 100% 100%  Weight:       Physical Exam  General: Well developed, chronically ill appearing male, stable Heart: RRR, no murmurs, rubs or gallops Lungs: CTA anteriorly without wheezing, rhonchi or rales Abdomen: Soft, non-tender, non-distended, +BS Extremities: No edema b/l lower extremities Dialysis Access: [facility] site with clean, dry bandage. LUE AVF + bruit Neuro: Alert, answers questions appropriately. R sided extremity weakness  Additional Objective Labs: Basic Metabolic Panel: Recent Labs  Lab 01/12/21 0201 01/13/21 0047 01/14/21 0115 01/14/21 1253  NA 137 138 137  --   K 3.8 3.8 4.0  --   CL 100 100 103  --   CO2 23 26 21*  --   GLUCOSE 79 233* 271*  --   BUN 62* 42* 59*  --   CREATININE 9.49* 7.21* 9.13*  --   CALCIUM 8.1* 8.4* 8.2* 7.7*  PHOS  --   --   --  5.0*   Liver Function Tests: Recent Labs  Lab 01/10/21 1255  AST 24  ALT 17  ALKPHOS 147*  BILITOT 0.8  PROT 7.5  ALBUMIN 3.2*   CBC: Recent Labs  Lab 01/10/21 1255 01/11/21 0018 01/11/21 1132 01/11/21 2052 01/12/21 0201 01/14/21 1253  WBC 11.1* 7.3  --   --  8.3 8.1  NEUTROABS 9.2*  --   --   --  6.1  --   HGB 7.7* 6.6*   < > 8.9* 9.9* 8.6*  HCT 24.7* 19.5*   < > 26.1* 29.2* 26.3*  MCV 100.8* 93.8  --   --  90.4 94.3  PLT 210 179  --   --  161 140*   < > = values in this interval not displayed.   Blood Culture    Component Value Date/Time   SDES CSF 04/14/2020 1827   SPECREQUEST NONE 04/14/2020 1827   CULT  04/14/2020 1827    NO  GROWTH 3 DAYS Performed at [facility], [place] [address], [place], [place] [address]    REPTSTATUS 04/18/2020 FINAL 04/14/2020 1827   CBG: Recent Labs  Lab 01/14/21 1151 01/14/21 1623 01/14/21 2119 01/15/21 0621 01/15/21 0758  GLUCAP 216* 82 137* 170* 217*    Studies/Results: IR Removal Tun Cv Cath W/O FL  Result Date: 01/14/2021 INDICATION: Patient with history of end-stage renal disease now with working AV fistula, request for removal of tunneled right IJ hemodialysis catheter. EXAM: REMOVAL OF TUNNELED HEMODIALYSIS CATHETER MEDICATIONS: 5 mL 1% lidocaine COMPLICATIONS: None immediate. PROCEDURE: Informed written consent was obtained from the patient following an explanation of the procedure, risks, benefits and alternatives to treatment. A time out was performed prior to the initiation of the procedure. Maximal barrier sterile technique was utilized including caps, mask, sterile gowns, sterile gloves, large sterile drape, hand hygiene, and Hibiclens. 1% lidocaine was injected under sterile conditions along the subcutaneous tunnel. Utilizing a combination of blunt dissection and gentle traction, the catheter was removed intact. Hemostasis was obtained with manual compression. A dressing was  placed. The patient tolerated the procedure well without immediate post procedural complication. IMPRESSION: Successful removal of tunneled right IJ dialysis catheter. Read by Candiss Norse, PA-C Electronically Signed   By: Miachel Roux M.D.   On: 01/14/2021 10:28   Medications:  . sodium chloride   Intravenous Once  . bictegravir-emtricitabine-tenofovir AF  1 tablet Oral Daily  . calcitRIOL  0.25 mcg Oral Daily  . carvedilol  3.125 mg Oral BID WC  . Chlorhexidine Gluconate Cloth  6 each Topical Q0600  . cholecalciferol  2,000 Units Oral q AM  . clopidogrel  75 mg Oral Daily  . darbepoetin (ARANESP) injection - DIALYSIS  60 mcg Intravenous Q Fri-HD  . doxepin  75 mg Oral QHS  . feeding  supplement (NEPRO CARB STEADY)  237 mL Oral TID WC  . heparin  5,000 Units Subcutaneous Q12H  . insulin aspart  0-9 Units Subcutaneous TID WC  . insulin glargine  5 Units Subcutaneous Daily  . levETIRAcetam  500 mg Oral BID  . loratadine  10 mg Oral Daily  . multivitamin  1 tablet Oral QHS  . pantoprazole  40 mg Oral Daily  . pravastatin  80 mg Oral QHS  . sulfamethoxazole-trimethoprim  1 tablet Oral Once per day on Mon Wed Fri  . tamsulosin  0.4 mg Oral Daily    Previous Dialysis Orders: [facility]  4h  400/800   2/2 bath  75kg   Hep 2000  LUA AVF/ TDC  Was receiving hectorol 61mg q HD and mircera 541m q 2 weeks   Assessment/Plan: 1. Noncompliance with dialysis:Pt started HD in Aug 2021. His last HD prior to admit here was Oct 02 2020.  He was poorly compliant prior to this, often shortening/missing treatments. He felt he did not need dialysis. Presented here w/ BUN 214, Cr 24. We explained that his labs were life threatening. Pt agreed to restart HD. Has had serial HD here x 3 w/ improved azotemia. Vol is okay. Pt intermittently mildly confused vs communication issue d/t aphasia. Used [facility] x 3 w[facility]o incident, [facility] Pt will need to have new dialysis unit arranged prior to DC - SW aware.  2. Chronic debility: w/ R hemiparesis and aphasia sp CVA. WC bound and SNF dependent.   3. Rectal abscess: Chief complaint on presentation to the ED. Management per primary team. 4. Hypertension/volume:Volume controlled, still makes urine. BP's good, on home coreg.   5. Anemia:Hgb 7.7 > 6.6. Hb 9's sp transfusion here. Was on ESA prior to stopping dialysis. Tsat 32%. Got 60 ug aranesp here on 4/22. .  6. Metabolic bone disease:Need to reestablish protocols. Phos controlled without binder,  PTH 106- will stop calcitriol. Calcium slightly low, use higher Ca bath.  7. Nutrition:Albumin 3.2. Recommend renal diet, fluid restrictions and protein supplementation.   SaAnice PaganiniPA-C 01/15/2021, 10:21 AM  [facility] Pager: [phone]

## 2021-01-15 NOTE — Evaluation (Signed)
Occupational Therapy Evaluation Patient Details Name: Richard [name] MRN: [id number] DOB: Jul 06, 1956 Today's Date: 01/15/2021    History of Present Illness Richard [name] is a 65 y.o. male with medical history significant of CKD stage V with recently starting HD via a right chest tunneled catheter, IDDM, HTN, HLD, HIV on HAART, seizure disorder came with worsening rectal pain for 4+ weeks.     Patient claimed that he stopped HD 2+ months ago for "feeling tired of it, and I do not think I need dialysis any more, I made good urine", he further denied any shortness of breath cough or chest pain.  Since 1 month go he has had frequent episodes of rectal pain, "aching a lot" denies any abdominal pain, denies any blood in stool.  Denies any weight loss.  Patient denied feeling depressed and denied suicidal idea.   Clinical Impression   Pt is a LTC resident is a LTC resident of a facility. Per pt reports at baseline,  total A with bathing, dressing and toileting, feed and groom self Indwith set up and SPTs with 1 person assist, Ind with w/c mobility. Pt is at baseline with ADLs, sat EOB with min A. Pt will return to SNF once medically ready. No further acute OT services are indicated at this time, any further OT intervention is deferred to his SNF.     Follow Up Recommendations  Other (comment) (any further OT intervention deferred to SNF)    Equipment Recommendations  None recommended by OT    Recommendations for Other Services       Precautions / Restrictions Precautions Precautions: Fall Precaution Comments: R UE hemiparesis, contracted from prior CVA Restrictions Weight Bearing Restrictions: No RLE Weight Bearing: Weight bearing as tolerated      Mobility Bed Mobility Overal bed mobility: Needs Assistance Bed Mobility: Supine to Sit;Sit to Supine     Supine to sit: Min assist;HOB elevated Sit to supine: Min assist   General bed mobility comments: min A with LEs to EOB  and to elevate trunk, used rails, increased time and effort required    Transfers                 General transfer comment: did not test    Balance Overall balance assessment: Needs assistance Sitting-balance support: Single extremity supported;Feet supported Sitting balance-Leahy Scale: Fair Sitting balance - Comments: posterior lean with min A for balance/support initailly, progressing to upright posture with min guard A. Began to lean to L when he became fatigued                                   ADL either performed or assessed with clinical judgement   ADL                                         General ADL Comments: pt at baseline: total A with bathing, dressing and toileting. Pt able to feed and groom self with set up     Vision Patient Visual Report: No change from baseline       Perception     Praxis      Pertinent Vitals/Pain Pain Assessment: No/denies pain     Hand Dominance Left   Extremity/Trunk Assessment Upper Extremity Assessment Upper Extremity Assessment: Generalized weakness;RUE deficits/detail RUE Deficits / Details:  contracture, hemiparesis from previous stroke           Communication Communication Communication: Expressive difficulties   Cognition Arousal/Alertness: Awake/alert Behavior During Therapy: Flat affect Overall Cognitive Status: No family/caregiver present to determine baseline cognitive functioning                                 General Comments: Pt A & O x 3, following commands   General Comments       Exercises     Shoulder Instructions      Home Living Family/patient expects to be discharged to:: Skilled nursing facility                                 Additional Comments: pt is a LTC resident of a SNF      Prior Functioning/Environment Level of Independence: Needs assistance  Gait / Transfers Assistance Needed: pt reports that he was person  SPT from bed - w/c and that he he was able to self propel in w/c ADL's / Homemaking Assistance Needed: pt reports that he is total A with bathing, dressing and toileting. Set up with self feeding and grooming            OT Problem List: Decreased strength;Pain;Impaired tone;Decreased activity tolerance;Decreased coordination;Impaired UE functional use;Decreased range of motion      OT Treatment/Interventions:      OT Goals(Current goals can be found in the care plan section) Acute Rehab OT Goals Patient Stated Goal: none stated  OT Frequency:     Barriers to D/C:            Co-evaluation              AM-PAC OT "6 Clicks" Daily Activity     Outcome Measure Help from another person eating meals?: A Little Help from another person taking care of personal grooming?: A Little Help from another person toileting, which includes using toliet, bedpan, or urinal?: Total Help from another person bathing (including washing, rinsing, drying)?: Total Help from another person to put on and taking off regular upper body clothing?: Total Help from another person to put on and taking off regular lower body clothing?: Total 6 Click Score: 10   End of Session    Activity Tolerance:   Patient left: in bed;with call bell/phone within reach;with bed alarm set  OT Visit Diagnosis: Other abnormalities of gait and mobility (R26.89);Muscle weakness (generalized) (M62.81);Hemiplegia and hemiparesis Hemiplegia - Right/Left: Right Hemiplegia - caused by: Cerebral infarction                Time: 1014-1030 OT Time Calculation (min): 16 min Charges:  OT General Charges $OT Visit: 1 Visit OT Evaluation $OT Eval Moderate Complexity: 1 Mod    Britt Bottom 01/15/2021, 12:52 PM

## 2021-01-15 NOTE — NC FL2 (Signed)
[place] LEVEL OF CARE SCREENING TOOL     IDENTIFICATION  Patient Name: Richard [name] Birthdate: 1956/03/01 Sex: male Admission Date (Current Location): 01/10/2021  [facility] and [place] Number:  Herbalist and Address:  [facility] [place], [place] [address] [place], [place] [address]      Provider Number: [id number]  Attending Physician Name and Address:  Tawni Millers  Relative Name and Phone Number:       Current Level of Care: Hospital Recommended Level of Care: [place] Prior Approval Number:    Date Approved/Denied:   PASRR Number: [id number]  Discharge Plan: SNF    Current Diagnoses: Patient Active Problem List   Diagnosis Date Noted  . Hemorrhoids 01/11/2021  . Hyperkalemia 01/10/2021  . Dialysis patient, noncompliant ([place])   . 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  . Syncope 06/01/2020  . Secondary hyperparathyroidism of renal origin ([place]) 05/28/2020  . COVID-19 05/16/2020  . Hypokalemia 05/16/2020  . Allergy, unspecified, initial encounter 05/08/2020  . Anaphylactic shock, unspecified, initial encounter 05/08/2020  . Anemia in chronic kidney disease 05/08/2020  . Complication of vascular dialysis catheter 05/08/2020  . Dependence on renal dialysis ([place]) 05/08/2020  . Headache, unspecified 05/08/2020  . Iron deficiency anemia, unspecified 05/08/2020  . Cerebellar stroke syndrome 05/07/2020  . Encounter for feeding tube placement   . Weakness   . Creatinine elevation   . Goals of care, counseling/discussion   . Hypertensive urgency   . Meningoencephalitis   . Palliative care by specialist   . ESRD (end stage renal disease) ([place])   . AIDS (acquired immune deficiency syndrome) ([place]) 04/13/2020  . Encephalopathy 04/13/2020  . Seizure ([place]) 04/10/2020  . History of cerebrovascular accident (CVA) with residual deficit 03/19/2020  . CKD (chronic kidney  disease) stage 5, GFR less than 15 ml/min (HCC) 02/15/2020  . Altered mental status 02/13/2020  . Neuropathy in diabetes ([place])   . Migraines   . Hypertension   . GERD (gastroesophageal reflux disease)   . Diabetes mellitus, type II, insulin dependent ([place])   . Arthritis   . Hyperlipidemia 08/25/2012  . Chronic kidney disease, stage 4 (severe) ([place]) 08/23/2012  . Tobacco abuse 08/23/2012  . Malnutrition of moderate degree ([place]) 08/23/2012  . 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 05/21/2012    Orientation RESPIRATION BLADDER Height & Weight     Self,Time,Situation,Place  Normal External catheter,Incontinent Weight: 143 lb 11.8 oz (65.2 kg) Height:     BEHAVIORAL SYMPTOMS/MOOD NEUROLOGICAL BOWEL NUTRITION STATUS      Incontinent Diet (please see discharge summary)  AMBULATORY STATUS COMMUNICATION OF NEEDS Skin     Verbally Normal                       Personal Care Assistance Level of Assistance  Bathing,Dressing,Feeding Bathing Assistance: Limited assistance Feeding assistance: Independent Dressing Assistance: Limited assistance     Functional Limitations Info  Sight,Hearing,Speech          SPECIAL CARE FACTORS FREQUENCY  PT (By licensed PT),OT (By licensed OT)     PT Frequency: 5x per wek OT Frequency: 5x per week            Contractures Contractures Info: Not present    Additional Factors Info  Code Status,Allergies,Insulin Sliding Scale Code Status Info: FULL Allergies Info: Oxycodone   Insulin Sliding Scale Info: see discharge summary       Current  Medications (01/15/2021):  This is the current hospital active medication list Current Facility-Administered Medications  Medication Dose Route Frequency Provider Last Rate Last Admin  . 0.9 %  sodium chloride infusion (Manually program via Guardrails IV Fluids)   Intravenous Once Arrien, Jimmy Picket, MD      . acetaminophen (TYLENOL) tablet 650 mg  650 mg Oral Q6H PRN  Lequita Halt, MD       Or  . acetaminophen (TYLENOL) suppository 650 mg  650 mg Rectal Q6H PRN Lequita Halt, MD      . bictegravir-emtricitabine-tenofovir AF (BIKTARVY) 50-200-25 MG per tablet 1 tablet  1 tablet Oral Daily Lequita Halt, MD   1 tablet at 01/14/21 1230  . carvedilol (COREG) tablet 3.125 mg  3.125 mg Oral BID WC Wynetta Fines T, MD   3.125 mg at 01/15/21 1646  . Chlorhexidine Gluconate Cloth 2 % PADS 6 each  6 each Topical Q0600 Janalee Dane, PA-C   6 each at 01/15/21 [phone]  . Chlorhexidine Gluconate Cloth 2 % PADS 6 each  6 each Topical Q0600 Janalee Dane, PA-C   6 each at 01/15/21 1041  . cholecalciferol (VITAMIN D3) tablet 2,000 Units  2,000 Units Oral q AM Lequita Halt, MD   2,000 Units at 01/15/21 1040  . clopidogrel (PLAVIX) tablet 75 mg  75 mg Oral Daily Wynetta Fines T, MD   75 mg at 01/15/21 1040  . Darbepoetin Alfa (ARANESP) injection 60 mcg  60 mcg Intravenous Q Fri-HD Collins, Hervey Ard, PA-C   60 mcg at 01/11/21 1831  . doxepin (SINEQUAN) capsule 75 mg  75 mg Oral QHS Wynetta Fines T, MD   75 mg at 01/13/21 2231  . feeding supplement (NEPRO CARB STEADY) liquid 237 mL  237 mL Oral TID WC Wynetta Fines T, MD   237 mL at 01/14/21 1231  . heparin injection 5,000 Units  5,000 Units Subcutaneous Q12H Lequita Halt, MD   5,000 Units at 01/15/21 1040  . insulin aspart (novoLOG) injection 0-9 Units  0-9 Units Subcutaneous TID WC Arrien, Jimmy Picket, MD   2 Units at 01/15/21 1645  . insulin glargine (LANTUS) injection 5 Units  5 Units Subcutaneous Daily Arrien, Jimmy Picket, MD   5 Units at 01/15/21 1039  . levETIRAcetam (KEPPRA) tablet 500 mg  500 mg Oral BID Wynetta Fines T, MD   500 mg at 01/15/21 1042  . lidocaine (PF) (XYLOCAINE) 1 % injection   Infiltration PRN Candiss Norse A, PA-C   10 mL at 01/14/21 1011  . loratadine (CLARITIN) tablet 10 mg  10 mg Oral Daily Wynetta Fines T, MD   10 mg at 01/15/21 1040  . multivitamin (RENA-VIT) tablet 1 tablet  1 tablet  Oral QHS Arrien, Jimmy Picket, MD   1 tablet at 01/13/21 2231  . ondansetron (ZOFRAN) tablet 4 mg  4 mg Oral Q6H PRN Wynetta Fines T, MD       Or  . ondansetron [facility]) injection 4 mg  4 mg Intravenous Q6H PRN Wynetta Fines T, MD   4 mg at 01/11/21 1514  . pantoprazole (PROTONIX) EC tablet 40 mg  40 mg Oral Daily Wynetta Fines T, MD   40 mg at 01/15/21 1040  . pravastatin (PRAVACHOL) tablet 80 mg  80 mg Oral QHS Wynetta Fines T, MD   80 mg at 01/13/21 2231  . sulfamethoxazole-trimethoprim (BACTRIM DS) 800-160 MG per tablet 1 tablet  1 tablet Oral Once per day on  Mon Wed Fri Lequita Halt, MD   1 tablet at 01/14/21 [phone]  . tamsulosin (FLOMAX) capsule 0.4 mg  0.4 mg Oral Daily Wynetta Fines T, MD   0.4 mg at 01/15/21 1040  . zolpidem (AMBIEN) tablet 5 mg  5 mg Oral QHS PRN Lequita Halt, MD         Discharge Medications: Please see discharge summary for a list of discharge medications.  Relevant Imaging Results:  Relevant Lab Results:   Additional Information SSN [id number]  HD [facility] T/T Sat chairtime 11:15am  arrive by 11;00am  Vinie Sill, LCSW

## 2021-01-15 NOTE — Progress Notes (Signed)
Medical Director at [facility] has gracious accepted patient back to outpatient clinic, even though he was extremely non-compliant with treatment even before completely stopping all together. She is willing to give him another chance as he states he wishes to resume HD rather than pursue hospice care. Navigator understands that HD RN Coordinator has spoken with patient about consequences of stopping HD and need for compliance if he is choosing to resume at this time.  [facility] has given patient a TTS schedule with a seat time of 11:15am. Given that patient has been out of the clinic for greater than 30 days, he will need to re-sign consents. Therefore, patient needs to arrive at 10:15am on Thursday, 01/17/21, his first day back to the clinic. This has been discussed with TOC CSW/C. Wynetta Emery, who will ensure that SNF/[facility] staff are aware. Inpatient Nephrologist and Renal PA updated.    Alphonzo Cruise, [place] Renal Navigator [phone]

## 2021-01-15 NOTE — Social Work (Signed)
CSW informed by Renal Navigator patient has set at [facility] , T/TH/Sat chair time at 11:15am but must arrive by 10:15am to complete paperwork.   CSW called SNF-informed patient has set at outpatient clinic- and anticipated d/c tomorrow    CSW will continue to follow and assist with discharge planning.   Thurmond Butts, MSW, LCSW Clinical Social Worker

## 2021-01-15 NOTE — Discharge Summary (Addendum)
Physician Discharge Summary  Richard Shawn YHC:[id number] DOB: 12-22-1955 DOA: 01/10/2021  PCP: [name], MD  Admit date: 01/10/2021 Discharge date: 01/15/2021  Admitted From: Home  Disposition:   SNF   Recommendations for Outpatient Follow-up and new medication changes:  1. Follow up with Dr Ethlyn Gallery in 7 to 10 days.  2. Continue outpatient hemodialysis Tuesday, Thursday, Saturday.  Home Health: na   Equipment/Devices: na    Discharge Condition: stable  CODE STATUS: full  Diet recommendation:  Heart healthy and diabetic prudent.   Brief/Interim Summary: Mr. [name] was admitted to the hospital with the working diagnosis of hyperkalemia in the setting of ESRD (non compliant with hemodialysis)  65 year old male past medical history for chronic kidney disease on hemodialysis, type 2 diabetes mellitus, hypertension, dyslipidemia, HIV and seizure disorder who presented with rectal pain. Patient reported stopping hemodialysis about 2 months ago, apparently he was feeling well and making urine. Over the last 4 weeks he experienced rectal pain, which was persistent and worsening, that prompted him to come to the hospital. On his initial physical examination blood pressure 147/49, heart rate 74, respiratory rate 18,his lungs were clear to auscultation bilaterally, heart S1-S2, present, rhythmic, soft abdomen, no lower extremity edema.  Sodium 144, potassium 7.2, chloride 109, bicarb less than 7, glucose 63, BUN 214, creatinine 24.8, white count 11.1, hemoglobin 7.7, hematocrit 24.7, platelets 210. SARS COVID-19 negative.  Chest radiograph with cardiomegaly, hilar vascular congestion bilaterally.  EKG 96 bpm, normal axis, QTC 510, sinus rhythm, Q-wave V1-V2, no significant ST segment or T wave changes, positive LVH.  Patient underwent emergent hemodialysis with correction of hyperkalemia.  Left upper extremity fistula mature and working well. His right IJ  catheter was removed with good toleration   Patient agrees in continue with renal replacement therapy. He is very weak and deconditioned, will be transferred to SNF to continue physical therapy.   1.  Acute hyperkalemia in the setting of end-stage renal disease, noncompliant with hemodialysis  Patient was admitted to the progressive care unit, he underwent emergent hemodialysis with good toleration. Hemodialysis access, left upper extremity fistula is maturing working appropriately. Temporary hemodialysis tunnel catheter was removed with good toleration.  Patient has agreed to continue with hemodialysis outpatient.  Anemia of chronic renal disease.  His iron stores showed serum iron of 54, transferrin saturation 32, ferritin 849, TIBC 169. His lowest hemoglobin reached 6.6, he received 1 unit packed red blood cells with good toleration. At discharge his hemoglobin is 8.6, hematocrit 26.3. Continue with darbepoetin on HD  Metabolic bone disease, patient will continue calcitriol.  2.  Rectal pain.  Likely due to hemorrhoids, rectal abscess was ruled out, patient received local steroids with good toleration.  3.  Type 2 diabetes mellitus, uncontrolled hypoglycemia/hyperglycemia.  Dyslipidemia Patient was placed on insulin therapy during his hospitalization, including sliding scale and basal.   Tolerating p.o. diet adequately.  Continue pravastatin.  4.  HIV.  He will continue antiretroviral therapy along with PJP prophylaxis with Bactrim.  5.  Seizure, history of CVA.  No active seizures during his hospitalization, continue Keppra. Patient on clopidogrel and statin for history of CVA.  6.  Hypertension.  Continue carvedilol for blood pressure control.  7.  Depression.  No agitation, continue doxepin.  8.  Moderate calorie protein malnutrition.  He will continue with nutritional supplements.   Discharge Diagnoses:  Principal Problem:   Hyperkalemia Active Problems:    Hyperlipidemia   Hypertension   GERD (gastroesophageal reflux disease)  Diabetes mellitus, type II, insulin dependent ([place])   History of cerebrovascular accident (CVA) with residual deficit   ESRD (end stage renal disease) ([place])   Anemia in chronic kidney disease   Hemorrhoids    Discharge Instructions   Allergies as of 01/15/2021      Reactions   Oxycodone Nausea And Vomiting      Medication List    TAKE these medications   Accu-Chek Aviva Plus test strip Generic drug: glucose blood 1 each by Other route See admin instructions. Use 1 test strip to test blood sugar two-three times daily What changed: Another medication with the same name was changed. Make sure you understand how and when to take each.   Accu-Chek Aviva Plus test strip Generic drug: glucose blood USE 1 strip TO test blood sugar 2 TIMES DAILY TO 3 TIMES DAILY What changed: See the new instructions.   acetaminophen 325 MG tablet Commonly known as: TYLENOL Take 650 mg by mouth every 6 (six) hours as needed for moderate pain.   bictegravir-emtricitabine-tenofovir AF 50-200-25 MG Tabs tablet Commonly known as: BIKTARVY Take 1 tablet by mouth daily.   blood glucose meter kit and supplies Kit Dispense based on patient and insurance preference. Check blood sugar 4 times a day What changed:   how much to take  how to take this  when to take this   calcitRIOL 0.25 MCG capsule Commonly known as: ROCALTROL Take 1 capsule (0.25 mcg total) by mouth daily.   carvedilol 3.125 MG tablet Commonly known as: COREG Take 1 tablet (3.125 mg total) by mouth 2 (two) times daily with a meal.   cetirizine 10 MG tablet Commonly known as: ZYRTEC Take 10 mg by mouth daily.   clopidogrel 75 MG tablet Commonly known as: PLAVIX Take 1 tablet (75 mg total) by mouth daily.   Commode 3-In-1 Misc Use as directed   Transfer Bench Misc Use as directed   doxepin 75 MG capsule Commonly known as: SINEQUAN Take 75 mg by  mouth at bedtime.   feeding supplement (NEPRO CARB STEADY) Liqd Take 237 mLs by mouth 3 (three) times daily with meals.   insulin aspart 100 UNIT/ML injection Commonly known as: novoLOG Inject 0-9 Units into the skin 3 (three) times daily with meals. For glucose 150 to 200 use 2 units, for 201 to 250 use 3 units, for 251 to 300 use 5 units, for 301 to 350 use 7 units for 351 units or above use 9 units.   insulin glargine 100 UNIT/ML injection Commonly known as: LANTUS Inject 0.05 mLs (5 Units total) into the skin at bedtime.   Insulin Pen Needle 31G X 8 MM Misc Commonly known as: Sure Comfort Pen Needles Use as directed twice daily with Novolog flex pen What changed:   how much to take  how to take this  when to take this   levETIRAcetam 500 MG tablet Commonly known as: Keppra Take 1 tablet (500 mg total) by mouth 2 (two) times daily.   multivitamin with minerals Tabs tablet Take 1 tablet by mouth daily.   Narcan 4 MG/0.1ML Liqd nasal spray kit Generic drug: naloxone Place 0.4 mg into the nose once.   pantoprazole 40 MG tablet Commonly known as: PROTONIX TAKE 1 TABLET BY MOUTH EVERY DAY   pravastatin 80 MG tablet Commonly known as: PRAVACHOL Take 1 tablet (80 mg total) by mouth at bedtime.   sulfamethoxazole-trimethoprim 800-160 MG tablet Commonly known as: BACTRIM DS Take 1 tablet by mouth  3 (three) times a week.   tamsulosin 0.4 MG Caps capsule Commonly known as: FLOMAX TAKE 1 CAPSULE BY MOUTH EVERY DAY   triamcinolone cream 0.1 % Commonly known as: KENALOG Apply 1 application topically 2 (two) times daily.   TRUEplus Insulin Syringe 31G X 5/16" 1 ML Misc Generic drug: Insulin Syringe-Needle U-100 Use pen needle with insulin 2 times daily What changed: See the new instructions.   Vitamin D 50 MCG (2000 UT) tablet Take 1 tablet (2,000 Units total) by mouth in the morning.   zolpidem 5 MG tablet Commonly known as: AMBIEN Take 5 mg by mouth at bedtime  as needed for sleep.       Allergies  Allergen Reactions  . Oxycodone Nausea And Vomiting    Consultations:  Nephrology   IR   Procedures/Studies: DG Pelvis 1-2 Views  Result Date: 01/10/2021 CLINICAL DATA:  Pelvic pain. No reported injury. 06/18/2009. EXAM: PELVIS - 1-2 VIEW COMPARISON:  Abdomen and pelvis CT dated 08/09/2020. FINDINGS: Normal appearing pelvic bones. Unremarkable hips. Atheromatous arterial calcifications. IMPRESSION: Normal appearing hips. Electronically Signed   By: Claudie Revering M.D.   On: 01/10/2021 14:45   IR Removal Tun Cv Cath W/O FL  Result Date: 01/14/2021 INDICATION: Patient with history of end-stage renal disease now with working AV fistula, request for removal of tunneled right IJ hemodialysis catheter. EXAM: REMOVAL OF TUNNELED HEMODIALYSIS CATHETER MEDICATIONS: 5 mL 1% lidocaine COMPLICATIONS: None immediate. PROCEDURE: Informed written consent was obtained from the patient following an explanation of the procedure, risks, benefits and alternatives to treatment. A time out was performed prior to the initiation of the procedure. Maximal barrier sterile technique was utilized including caps, mask, sterile gowns, sterile gloves, large sterile drape, hand hygiene, and Hibiclens. 1% lidocaine was injected under sterile conditions along the subcutaneous tunnel. Utilizing a combination of blunt dissection and gentle traction, the catheter was removed intact. Hemostasis was obtained with manual compression. A dressing was placed. The patient tolerated the procedure well without immediate post procedural complication. IMPRESSION: Successful removal of tunneled right IJ dialysis catheter. Read by Candiss Norse, PA-C Electronically Signed   By: Miachel Roux M.D.   On: 01/14/2021 10:28   DG Chest Portable 1 View  Result Date: 01/10/2021 CLINICAL DATA:  Missed dialysis.  Rule out fluid overload EXAM: PORTABLE CHEST 1 VIEW COMPARISON:  08/08/2020 FINDINGS: Right  jugular dialysis catheter tip at the cavoatrial junction unchanged. Cardiac enlargement. Normal vascularity. Negative for edema or effusion. Negative for pneumonia. IMPRESSION: No active disease. Electronically Signed   By: Franchot Gallo M.D.   On: 01/10/2021 13:46      Procedures:  Right tunnled HD catheter removal.   Subjective: Patient is feeling well, no nausea or vomiting, no dyspnea or chest pain, continue to be very weak and deconditioned.   Discharge Exam: Vitals:   01/15/21 0427 01/15/21 0936  BP: (!) 156/83 136/69  Pulse: 96 87  Resp: 19 17  Temp: 97.6 F (36.4 C) 98.5 F (36.9 C)  SpO2: 100% 100%   Vitals:   01/14/21 2100 01/14/21 2326 01/15/21 0427 01/15/21 0936  BP: 137/75 (!) 141/68 (!) 156/83 136/69  Pulse: 95 98 96 87  Resp: '20 17 19 17  ' Temp: 98.3 F (36.8 C) 98.3 F (36.8 C) 97.6 F (36.4 C) 98.5 F (36.9 C)  TempSrc: Oral Oral Oral Oral  SpO2: 99% 98% 100% 100%  Weight:        General: Not in pain or dyspnea, deconditioned  Neurology:  Awake and alert, non focal  E ENT: mild pallor, no icterus, oral mucosa moist Cardiovascular: No JVD. S1-S2 present, rhythmic, no gallops, rubs, or murmurs. No lower extremity edema. Pulmonary:  Positive breath sounds bilaterally, adequate air movement, no wheezing, rhonchi or rales. Gastrointestinal. Abdomen soft and non tender Skin. No rashes Musculoskeletal: no joint deformities   The results of significant diagnostics from this hospitalization (including imaging, microbiology, ancillary and laboratory) are listed below for reference.     Microbiology: Recent Results (from the past 240 hour(s))  Resp Panel by RT-PCR (Flu A&B, Covid) Nasopharyngeal Swab     Status: None   Collection Time: 01/10/21  1:56 PM   Specimen: Nasopharyngeal Swab; Nasopharyngeal(NP) swabs in vial transport medium  Result Value Ref Range Status   SARS Coronavirus 2 by RT PCR NEGATIVE NEGATIVE Final    Comment: (NOTE) SARS-CoV-2 target  nucleic acids are NOT DETECTED.  The SARS-CoV-2 RNA is generally detectable in upper respiratory specimens during the acute phase of infection. The lowest concentration of SARS-CoV-2 viral copies this assay can detect is 138 copies/mL. A negative result does not preclude SARS-Cov-2 infection and should not be used as the sole basis for treatment or other patient management decisions. A negative result may occur with  improper specimen collection/handling, submission of specimen other than nasopharyngeal swab, presence of viral mutation(s) within the areas targeted by this assay, and inadequate number of viral copies(<138 copies/mL). A negative result must be combined with clinical observations, patient history, and epidemiological information. The expected result is Negative.  Fact Sheet for Patients:  EntrepreneurPulse.com.au  Fact Sheet for Healthcare Providers:  IncredibleEmployment.be  This test is no t yet approved or cleared by the [place] FDA and  has been authorized for detection and/or diagnosis of SARS-CoV-2 by FDA under an Emergency Use Authorization (EUA). This EUA will remain  in effect (meaning this test can be used) for the duration of the COVID-19 declaration under Section 564(b)(1) of the Act, 21 U.S.C.section 360bbb-3(b)(1), unless the authorization is terminated  or revoked sooner.       Influenza A by PCR NEGATIVE NEGATIVE Final   Influenza B by PCR NEGATIVE NEGATIVE Final    Comment: (NOTE) The Xpert Xpress SARS-CoV-2/FLU/RSV plus assay is intended as an aid in the diagnosis of influenza from Nasopharyngeal swab specimens and should not be used as a sole basis for treatment. Nasal washings and aspirates are unacceptable for Xpert Xpress SARS-CoV-2/FLU/RSV testing.  Fact Sheet for Patients: EntrepreneurPulse.com.au  Fact Sheet for Healthcare  Providers: IncredibleEmployment.be  This test is not yet approved or cleared by the [place] FDA and has been authorized for detection and/or diagnosis of SARS-CoV-2 by FDA under an Emergency Use Authorization (EUA). This EUA will remain in effect (meaning this test can be used) for the duration of the COVID-19 declaration under Section 564(b)(1) of the Act, 21 U.S.C. section 360bbb-3(b)(1), unless the authorization is terminated or revoked.  Performed at [facility], [place] [address], [place], [place] [address]   MRSA PCR Screening     Status: None   Collection Time: 01/10/21  8:34 PM   Specimen: Nasopharyngeal  Result Value Ref Range Status   MRSA by PCR NEGATIVE NEGATIVE Final    Comment:        The GeneXpert MRSA Assay (FDA approved for NASAL specimens only), is one component of a comprehensive MRSA colonization surveillance program. It is not intended to diagnose MRSA infection nor to guide or monitor treatment for MRSA  infections. Performed at [facility], [place] [address], [place], [place] [address]      Labs: BNP (last 3 results) No results for input(s): BNP in the last 8760 hours. Basic Metabolic Panel: Recent Labs  Lab 01/10/21 1255 01/11/21 0018 01/12/21 0201 01/13/21 0047 01/14/21 0115 01/14/21 1253  NA 144 142 137 138 137  --   K 7.2* 3.9 3.8 3.8 4.0  --   CL 109 104 100 100 103  --   CO2 <7* 15* 23 26 21*  --   GLUCOSE 63* 29* 79 233* 271*  --   BUN 214* 117* 62* 42* 59*  --   CREATININE 24.82* 15.22* 9.49* 7.21* 9.13*  --   CALCIUM 7.7* 7.9* 8.1* 8.4* 8.2* 7.7*  PHOS  --   --   --   --   --  5.0*   Liver Function Tests: Recent Labs  Lab 01/10/21 1255  AST 24  ALT 17  ALKPHOS 147*  BILITOT 0.8  PROT 7.5  ALBUMIN 3.2*   No results for input(s): LIPASE, AMYLASE in the last 168 hours. No results for input(s): AMMONIA in the last 168 hours. CBC: Recent Labs  Lab 01/10/21 1255 01/11/21 0018  01/11/21 1132 01/11/21 2052 01/12/21 0201 01/14/21 1253  WBC 11.1* 7.3  --   --  8.3 8.1  NEUTROABS 9.2*  --   --   --  6.1  --   HGB 7.7* 6.6* 7.5* 8.9* 9.9* 8.6*  HCT 24.7* 19.5* 21.9* 26.1* 29.2* 26.3*  MCV 100.8* 93.8  --   --  90.4 94.3  PLT 210 179  --   --  161 140*   Cardiac Enzymes: No results for input(s): CKTOTAL, CKMB, CKMBINDEX, TROPONINI in the last 168 hours. BNP: Invalid input(s): POCBNP CBG: Recent Labs  Lab 01/14/21 1623 01/14/21 2119 01/15/21 0621 01/15/21 0758 01/15/21 1148  GLUCAP 82 137* 170* 217* 173*   D-Dimer No results for input(s): DDIMER in the last 72 hours. Hgb A1c Recent Labs    01/13/21 0100  HGBA1C 5.6   Lipid Profile No results for input(s): CHOL, HDL, LDLCALC, TRIG, CHOLHDL, LDLDIRECT in the last 72 hours. Thyroid function studies No results for input(s): TSH, T4TOTAL, T3FREE, THYROIDAB in the last 72 hours.  Invalid input(s): FREET3 Anemia work up No results for input(s): VITAMINB12, FOLATE, FERRITIN, TIBC, IRON, RETICCTPCT in the last 72 hours. Urinalysis    Component Value Date/Time   COLORURINE STRAW (A) 04/11/2020 0223   APPEARANCEUR CLEAR 04/11/2020 0223   LABSPEC 1.011 04/11/2020 0223   PHURINE 5.0 04/11/2020 0223   GLUCOSEU 50 (A) 04/11/2020 0223   HGBUR SMALL (A) 04/11/2020 0223   BILIRUBINUR NEGATIVE 04/11/2020 0223   KETONESUR NEGATIVE 04/11/2020 0223   PROTEINUR >=300 (A) 04/11/2020 0223   UROBILINOGEN 0.2 11/29/2014 2003   NITRITE NEGATIVE 04/11/2020 0223   LEUKOCYTESUR NEGATIVE 04/11/2020 0223   Sepsis Labs Invalid input(s): PROCALCITONIN,  WBC,  LACTICIDVEN Microbiology Recent Results (from the past 240 hour(s))  Resp Panel by RT-PCR (Flu A&B, Covid) Nasopharyngeal Swab     Status: None   Collection Time: 01/10/21  1:56 PM   Specimen: Nasopharyngeal Swab; Nasopharyngeal(NP) swabs in vial transport medium  Result Value Ref Range Status   SARS Coronavirus 2 by RT PCR NEGATIVE NEGATIVE Final    Comment:  (NOTE) SARS-CoV-2 target nucleic acids are NOT DETECTED.  The SARS-CoV-2 RNA is generally detectable in upper respiratory specimens during the acute phase of infection. The lowest concentration of  SARS-CoV-2 viral copies this assay can detect is 138 copies/mL. A negative result does not preclude SARS-Cov-2 infection and should not be used as the sole basis for treatment or other patient management decisions. A negative result may occur with  improper specimen collection/handling, submission of specimen other than nasopharyngeal swab, presence of viral mutation(s) within the areas targeted by this assay, and inadequate number of viral copies(<138 copies/mL). A negative result must be combined with clinical observations, patient history, and epidemiological information. The expected result is Negative.  Fact Sheet for Patients:  EntrepreneurPulse.com.au  Fact Sheet for Healthcare Providers:  IncredibleEmployment.be  This test is no t yet approved or cleared by the [place] FDA and  has been authorized for detection and/or diagnosis of SARS-CoV-2 by FDA under an Emergency Use Authorization (EUA). This EUA will remain  in effect (meaning this test can be used) for the duration of the COVID-19 declaration under Section 564(b)(1) of the Act, 21 U.S.C.section 360bbb-3(b)(1), unless the authorization is terminated  or revoked sooner.       Influenza A by PCR NEGATIVE NEGATIVE Final   Influenza B by PCR NEGATIVE NEGATIVE Final    Comment: (NOTE) The Xpert Xpress SARS-CoV-2/FLU/RSV plus assay is intended as an aid in the diagnosis of influenza from Nasopharyngeal swab specimens and should not be used as a sole basis for treatment. Nasal washings and aspirates are unacceptable for Xpert Xpress SARS-CoV-2/FLU/RSV testing.  Fact Sheet for Patients: EntrepreneurPulse.com.au  Fact Sheet for Healthcare  Providers: IncredibleEmployment.be  This test is not yet approved or cleared by the [place] FDA and has been authorized for detection and/or diagnosis of SARS-CoV-2 by FDA under an Emergency Use Authorization (EUA). This EUA will remain in effect (meaning this test can be used) for the duration of the COVID-19 declaration under Section 564(b)(1) of the Act, 21 U.S.C. section 360bbb-3(b)(1), unless the authorization is terminated or revoked.  Performed at [facility], [place] [address] [place], [place] [address]   MRSA PCR Screening     Status: None   Collection Time: 01/10/21  8:34 PM   Specimen: Nasopharyngeal  Result Value Ref Range Status   MRSA by PCR NEGATIVE NEGATIVE Final    Comment:        The GeneXpert MRSA Assay (FDA approved for NASAL specimens only), is one component of a comprehensive MRSA colonization surveillance program. It is not intended to diagnose MRSA infection nor to guide or monitor treatment for MRSA infections. Performed at [facility], [place] [address] [place], [place] [address]      Time coordinating discharge: 45 minutes  SIGNED:   Tawni Millers, MD  [facility] 01/15/2021, 3:29 PM

## 2021-01-15 NOTE — Evaluation (Addendum)
Physical Therapy Evaluation Patient Details Name: Richard [name] MRN: [id number] DOB: 04-11-1956 Today's Date: 01/15/2021   History of Present Illness  Richard [name] is a 65 y.o. male presenting 4/21 with worsening rectal pain for 4+ weeks. Patient claimed that he stopped HD 2+ months ago for "feeling tired of it, and I do not think I need dialysis any more, I made good urine", he further denied any shortness of breath cough or chest pain.  Since 1 month go he has had frequent episodes of rectal pain, "aching a lot" denies any abdominal pain, denies any blood in stool.  Denies any weight loss.  Patient denied feeling depressed and denied suicidal idea.  PMHx:  stroke with R sided weakness, CKD stage V with recently starting HD via a right chest tunneled catheter, IDDM, HTN, HLD, HIV on HAART, seizure disorde  Clinical Impression  Pt admitted with/for rectal pain.  Pt needing min to mod assist overall.  Pt likely could improve function to improve caregiver burden in the facility if he participates well.  Pt currently limited functionally due to the problems listed. ( See problems list.)   Pt will benefit from PT to maximize function and safety in order to get ready for next venue listed below.     Follow Up Recommendations SNF;Other (comment) (Trial of SNF to see if pt can improve burden of care.)    Equipment Recommendations  None recommended by PT    Recommendations for Other Services       Precautions / Restrictions Precautions Precautions: Fall Precaution Comments: R UE hemiparesis, contracted from prior CVA Restrictions Weight Bearing Restrictions: No      Mobility  Bed Mobility Overal bed mobility: Needs Assistance Bed Mobility: Supine to Sit;Sit to Supine     Supine to sit: Mod assist (HOB fliat) Sit to supine: Min assist   General bed mobility comments: mod with no rails, flat bed, up via L elbow and truncal assist forward.    Transfers Overall transfer  level: Needs assistance   Transfers: Squat Pivot Transfers     Squat pivot transfers: Max assist     General transfer comment: face to face assist forward and boost via squat pivot to strong side  Ambulation/Gait             General Gait Details: not able  Stairs            Wheelchair Mobility    Modified Rankin (Stroke Patients Only)       Balance Overall balance assessment: Needs assistance Sitting-balance support: Single extremity supported;Feet supported Sitting balance-Leahy Scale: Poor Sitting balance - Comments: if patient is not given time to work on balance he consistently leans posteriorly upon sitting up EOB, needing minimal assist. Postural control: Posterior lean                                   Pertinent Vitals/Pain Pain Assessment: No/denies pain    Home Living Family/patient expects to be discharged to:: Skilled nursing facility                 Additional Comments: pt is a LTC resident of a SNF    Prior Function Level of Independence: Needs assistance   Gait / Transfers Assistance Needed: pt reports that he was person SPT from bed - w/c and that he he was able to self propel in w/c  ADL's / Homemaking Assistance  Needed: pt reports that he is total A with bathing, dressing and toileting. Set up with self feeding and grooming        Hand Dominance   Dominant Hand: Left    Extremity/Trunk Assessment   Upper Extremity Assessment Upper Extremity Assessment: Generalized weakness;RUE deficits/detail RUE Deficits / Details: contracture, hemiparesis from previous stroke    Lower Extremity Assessment Lower Extremity Assessment: RLE deficits/detail;LLE deficits/detail RLE Deficits / Details: paretic, moves in synergy overall, little isolated movement, gross extension 3/5, gross flexion 2+/5 LLE Deficits / Details: general weakness LLE Coordination: decreased fine motor       Communication   Communication:  Expressive difficulties  Cognition Arousal/Alertness: Awake/alert Behavior During Therapy: Flat affect Overall Cognitive Status: No family/caregiver present to determine baseline cognitive functioning                                 General Comments: Pt A & O x 3, following commands      General Comments General comments (skin integrity, edema, etc.): vss on RA    Exercises     Assessment/Plan    PT Assessment Patient needs continued PT services  PT Problem List Decreased strength;Decreased activity tolerance;Decreased balance;Decreased mobility;Decreased coordination;Decreased knowledge of use of DME;Impaired tone       PT Treatment Interventions Functional mobility training;Therapeutic activities;Therapeutic exercise;Balance training;Neuromuscular re-education;Patient/family education    PT Goals (Current goals can be found in the Care Plan section)  Acute Rehab PT Goals Patient Stated Goal: none stated PT Goal Formulation: With patient Time For Goal Achievement: 01/29/21 Potential to Achieve Goals: Fair    Frequency Min 2X/week   Barriers to discharge        Co-evaluation               AM-PAC PT "6 Clicks" Mobility  Outcome Measure Help needed turning from your back to your side while in a flat bed without using bedrails?: A Lot Help needed moving from lying on your back to sitting on the side of a flat bed without using bedrails?: A Lot Help needed moving to and from a bed to a chair (including a wheelchair)?: A Lot Help needed standing up from a chair using your arms (e.g., wheelchair or bedside chair)?: A Lot Help needed to walk in hospital room?: Total Help needed climbing 3-5 steps with a railing? : Total 6 Click Score: 10    End of Session   Activity Tolerance: Patient tolerated treatment well Patient left: in chair;with call bell/phone within reach;with chair alarm set Nurse Communication: Mobility status PT Visit Diagnosis: Other  abnormalities of gait and mobility (R26.89);Muscle weakness (generalized) (M62.81);Other symptoms and signs involving the nervous system (R29.898)    Time: [id number] PT Time Calculation (min) (ACUTE ONLY): 24 min   Charges:   PT Evaluation $PT Eval Moderate Complexity: 1 Mod PT Treatments $Therapeutic Activity: 8-22 mins        01/15/2021  Ginger Carne., PT Acute Rehabilitation Services [phone]  (pager) [phone]  (office)  Tessie Fass Emmery Seiler 01/15/2021, 3:33 PM

## 2021-01-15 NOTE — Progress Notes (Signed)
Pt refused the COVID swab. Education gave to pt. Will attempt later.   Lavenia Atlas, RN

## 2021-01-15 NOTE — Progress Notes (Signed)
Patient refused his night time medication.  Patient kept stating that he was not going to take any medication until he eats.  RN advised paid that he ate his entire dinner tray.  Patient kept saying that he was not given any food all day.  RN offered patient a [place] sandwich and snacks, patient advised that was not real food.  Patient continued to refuse his medication.

## 2021-01-15 NOTE — Progress Notes (Signed)
Pt refused taking coreg. Stated that " I don't have blood pressure issue" . Education about benefit of taking the med given for pt. However, pt still refused taking the med. MD notified.   Lavenia Atlas, RN

## 2021-01-16 LAB — SARS CORONAVIRUS 2 (TAT 6-24 HRS): SARS Coronavirus 2: NEGATIVE

## 2021-01-16 LAB — GLUCOSE, CAPILLARY
Glucose-Capillary: 83 mg/dL (ref 70–99)
Glucose-Capillary: 94 mg/dL (ref 70–99)
Glucose-Capillary: 232 mg/dL — ABNORMAL HIGH (ref 70–99)
Glucose-Capillary: 129 mg/dL — ABNORMAL HIGH (ref 70–99)

## 2021-01-16 NOTE — Discharge Instructions (Signed)
Dialysis Dialysis is a procedure that is done when the kidneys have stopped working properly (kidney failure). It may also be done earlier if it may help improve symptoms. During dialysis, wastes, salt, and extra water are removed from the blood, and the levels of certain minerals in the blood are maintained. Dialysis is done in sessions that are continued until the kidneys get better. If the kidneys cannot get better, such as in end-stage kidney disease, dialysis is continued for life or until you receive a new kidney from a donor (kidney transplant). Types of treatments There are two types of dialysis: hemodialysis and peritoneal dialysis. Both types of dialysis have advantages and disadvantages. Talk with your health care provider about which type of dialysis is best for you. Your lifestyle, preferences, and medical condition will be considered. In some cases, only one type of dialysis can be chosen. Hemodialysis Hemodialysis is when blood is filtered with a machine and a filter called a dialyzer. This treatment is usually done at a hospital or dialysis center three times per week. Visits last about 3-5 hours.  Before you start hemodialysis treatment, you will have minor surgery to create a vascular access point. Your vascular access will be where you'll connect to the dialyzer.  Home hemodialysis may also be an option for some patients. This means that hemodialysis can be performed at home with the help of a family member or caregiver who has had special training. Peritoneal dialysis Peritoneal dialysis is when the thin lining of the abdomen (peritoneum) and a fluid called dialysate are used to filter the blood.  Before you start peritoneal dialysis, a surgeon places a soft tube, called a catheter, in your belly.  You may do peritoneal dialysis at home or at almost any other location. After training, most people can perform both types of peritoneal dialysis on their own.  You may need up to  five exchanges a day. Each exchange takes about 30-40 minutes. The amount of time the dialysate is in your body between exchanges is called a dwell. The dwell usually lasts 1.5-3 hours and can vary with each person. You may choose to do exchanges at night while you sleep, using a machine called a cycler. What are the advantages? Advantages of hemodialysis  It is done less often than peritoneal dialysis.  Someone else can do the dialysis for you.  If you go to a dialysis center: ? Your health care provider can recognize any problems you may be having. ? You can interact with others who are having dialysis. This can provide you with emotional support. Advantages of peritoneal dialysis  It is less likely than hemodialysis to cause cramps and low blood pressure.  There are fewer eating restrictions than with hemodialysis.  You may do exchanges on your own wherever you are, including when you travel.  If you do automated peritoneal dialysis, you can set up your cycler every night. What are the disadvantages Disadvantages of hemodialysis Generally, hemodialysis and peritoneal dialysis are safe treatments. However, problems may occur.  Cramps and low blood pressure. It may leave you feeling tired on the days you have the treatment.  The need to make weekly appointments and work around the center's schedule, if you go to a dialysis center.  The need to take extra care when traveling. If you usually get treatment in a dialysis center, you will need to arrange to visit a dialysis center near your destination. If you are having treatments at home, you will need to  take the dialyzer with you when traveling.  More eating restrictions than with peritoneal dialysis. Disadvantages of peritoneal dialysis  More frequent treatments than with hemodialysis.  The need to have a good use (dexterity) of your hands. You must also be able to lift bags.  The need to learn how to make your equipment free of  germs (sterilization techniques). You will need to use these techniques every day to prevent infection. What happens before the treatment? Hemodialysis Before starting hemodialysis, you will have minor surgery to create a site where blood can be removed from your body and returned to your body (vascular access). There are three types of vascular accesses:  Arteriovenous (AV) fistula. This type of access is created when an artery and a vein (usually in the arm) are connected during surgery. The AV fistula also has a large diameter that allows your blood to flow out and back into your body quickly. The goal is to allow high blood flow so that the largest amount of blood can pass through the dialyzer. The arteriovenous fistula usually takes 1-6 months to develop after surgery. It may last longer than the other types of vascular accesses and is less likely to become infected or cause blood clots.  Arteriovenous graft. If problems with your veins prevent you from having an AV fistula, you may need an AV graft instead. This type of access is created when an artery and a vein in the arm are connected during surgery with a tube. An arteriovenous graft can usually be used within 2-3 weeks of surgery.  A venous catheter. To create this type of access, a thin tube (catheter) is placed in a large vein in your neck, chest, or groin. A venous catheter can be used right away. It is usually used as a short-term access when dialysis needs to be started right away. Peritoneal dialysis Before starting peritoneal dialysis, you will have surgery to place a catheter in your abdomen. The catheter will be used to transfer a solution called dialysate to and from your abdomen. What happens during the treatment? Hemodialysis During hemodialysis, blood will leave your body through your vascular access site. Blood will travel through a tube to the dialyzer, where it is filtered. The blood will then return to your body through a  different tube. Peritoneal dialysis At the start of a dialysis session, your abdomen will be filled with dialysate solution. During dialysis, waste, salt, and extra water in the blood will pass through the peritoneum and into the dialysate solution. The dialysate solution will be drained from the body at the end of the dialysis session. The process of filling and draining the dialysate is called an exchange. Exchanges are repeated until you have used up all of the dialysate solution for that day.         Follow these instructions at home: Eating and drinking  Make changes to your diet as told by your health care provider, such as: ? Limiting your intake of foods that contain a lot of phosphorus and potassium. ? Limiting your fluid and salt intake. ? Add protein to your diet because dialysis removes protein.  Work with a diet and nutrition specialist (dietitian) to make a meal plan that can help improve your dialysis and your health and healthy ways to add calories to your diet because you may not have a good appetite.  Take vitamins made for people with kidney failure. Activity  Rest as told by your health care provider. You may have  less energy.  You may need to limit some physical activities when your belly is full of dialysis solution.  Return to your normal activities as told by your health care provider. Ask your health care provider what activities are safe for you. General instructions  Try to adjust to the dialysis treatment, schedule, and diet. This can take some time. You may need to stop working and may not be able to do some of your normal activities.  You may feel anxious or depressed when starting dialysis. Over time, many people feel better overall because of dialysis. You may be able to return to work after making some changes, such as reducing work intensity.  Take over-the-counter and prescription medicines only as told by your health care provider.  Keep all  follow-up visits. This is important. Where to find more information  [place]: www.kidney.org  American Association of Kidney Patients: BombTimer.gl  American Kidney Fund: www.kidneyfund.org Summary  During dialysis, wastes, salt, and extra water are removed from the blood, and the levels of certain minerals in the blood are maintained. There are two types of dialysis: hemodialysis and peritoneal dialysis.  Hemodialysis is when the blood is filtered with a machine and a filter called a dialyzer.  Peritoneal dialysis is when the peritoneum is used as a filter. You may do peritoneal dialysis at home or at almost any other location.  Both types of dialysis have advantages and disadvantages. Talk with your health care provider about which type of dialysis is best for you. This information is not intended to replace advice given to you by your health care provider. Make sure you discuss any questions you have with your health care provider. Document Revised: 04/03/2020 Document Reviewed: 04/03/2020 Elsevier Patient Education  [place].

## 2021-01-16 NOTE — Progress Notes (Signed)
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  Ongoing  GOAL:   Patient will meet greater than or equal to 90% of their needs   Progressing   MONITOR:   Supplement acceptance,PO intake,Labs,Weight trends,Skin,I & O's  REASON FOR ASSESSMENT:   Consult Assessment of nutrition requirement/status  ASSESSMENT:   Richard [name] is a 65 y.o. male with medical history significant of CKD stage V with recently starting HD via a right chest tunneled catheter, IDDM, HTN, HLD, HIV on HAART, seizure disorder came with worsening rectal pain for 4+ weeks.Pt admitted with hyperkalemia.   Patient reports his appetite remains stable. Last two meal completions charted as 90%. Does not like Nepro shakes. Will try Magic Cup.   Noted patient now under dry weight. Will need repeat NFPE at next follow up if patient remains admitted.   EDW: 75 kg   Current weight: 64.8 kg   Medications: aranesp, SS novolog, lantus Labs: CBG 83-217 Phosphorus 5.0 (wdl for HD)  Diet Order:   Diet Order            Diet - low sodium heart healthy           Diet renal/carb modified with fluid restriction Diet-HS Snack? Nothing; Fluid restriction: 1200 mL Fluid; Room service appropriate? Yes with Assist; Fluid consistency: Thin  Diet effective now                 EDUCATION NEEDS:   Education needs have been addressed  Skin:  Skin Assessment: Reviewed RN Assessment  Last BM:  4/25  Height:   Ht Readings from Last 1 Encounters:  08/03/20 6' (1.829 m)    Weight:   Wt Readings from Last 1 Encounters:   01/16/21 64.8 kg    Ideal Body Weight:  80.9 kg  BMI:  Body mass index is 19.38 kg/m.  Estimated Nutritional Needs:   Kcal:  2100-2300  Protein:  115-130 grams  Fluid:  1000 ml + UOP  Mariana Single RD, LDN Clinical Nutrition Pager listed in [place]

## 2021-01-16 NOTE — TOC Progression Note (Signed)
Transition of Care [facility]) - Progression Note    Patient Details  Name: Richard [name] MRN: [id number] Date of Birth: 1955-11-11  Transition of Care [facility]) CM/SW [place], [place] Phone Number: 01/16/2021, 2:34 PM  Clinical Narrative:    9:05am- CSW contacted patient's daughter- informed he is refusing to return to SNF. She states patient has no family or anywhere to go if he is discharged, he has no other option. She expressed if he did he would not be at [facility]. CSW encourage family to talk with patient.   1:36pm-CSW met with patient at bedside. CSW introduced self and explained role. CSW discuss with patient disposition plan. Patient states he does not want to return to SNF and refused CSW searching for any other SNF placement (although another placement may be difficult). Patient states he wants his own place and he was not returning to any SNF, only to his own place. CSW inquired about any specific concerns he wanted CSW to address with the SNF,  he wants his own place. Patient was not wiling to be cooperative with trying to discuss a safe disposition plan. He states we can discharge him, he has a car and he an find his own place. CSW inquired if he plans on attending dialysis- he did not respond.   CSW has updated leadership/Jessie. CSW will continue to follow and assist with discharge planning.  Thurmond Butts, MSW, LCSW Clinical Social Worker   Expected Discharge Plan: Skilled Nursing Facility Barriers to Discharge: Barriers Resolved  Expected Discharge Plan and Services Expected Discharge Plan: [place] In-house Referral: Clinical Social Work     Living arrangements for the past 2 months: [place] Expected Discharge Date: 01/16/21                                     Social Determinants of Health (SDOH) Interventions    Readmission Risk Interventions Readmission Risk Prevention Plan 02/15/2020  Transportation  Screening Complete  PCP or Specialist Appt within 5-7 Days Not Complete  Not Complete comments pending disposition  Home Care Screening Complete  Medication Review (RN CM) Referral to Pharmacy  Some recent data might be hidden

## 2021-01-16 NOTE — Progress Notes (Signed)
[facility] Progress Note   Subjective:   Pt seen in room, reports feeling well without SOB, CP, palpitations, dizziness, abdominal pain or nausea. Accepted at [facility] on TTS schedule with first HD planned for tomorrow so will not have HD here today.   Objective Vitals:   01/15/21 2036 01/15/21 2351 01/16/21 0331 01/16/21 0428  BP: (!) 155/75 (!) 166/79 (!) 141/73   Pulse: 68 100 91   Resp: '18 18 18   '$ Temp: 98.5 F (36.9 C) 98.5 F (36.9 C) 98.5 F (36.9 C)   TempSrc: Oral Oral Oral   SpO2: (!) 61% 100% 97%   Weight:    64.8 kg   Physical Exam General: Well developed, chronically ill appearing male, in NAD Heart: RRR, no murmurs, rubs or gallops Lungs: CTA anteriorly without wheezing, rhonchi or rales Abdomen: Soft, non-tender, non-distended, +BS Extremities: No edema b/l lower extremities Dialysis Access: [facility] site with clean/dry. LUE AVF + bruit Neuro: Alert, answers questions appropriately. R sided extremity weakness  Additional Objective Labs: Basic Metabolic Panel: Recent Labs  Lab 01/12/21 0201 01/13/21 0047 01/14/21 0115 01/14/21 1253  NA 137 138 137  --   K 3.8 3.8 4.0  --   CL 100 100 103  --   CO2 23 26 21*  --   GLUCOSE 79 233* 271*  --   BUN 62* 42* 59*  --   CREATININE 9.49* 7.21* 9.13*  --   CALCIUM 8.1* 8.4* 8.2* 7.7*  PHOS  --   --   --  5.0*   Liver Function Tests: Recent Labs  Lab 01/10/21 1255  AST 24  ALT 17  ALKPHOS 147*  BILITOT 0.8  PROT 7.5  ALBUMIN 3.2*   CBC: Recent Labs  Lab 01/10/21 1255 01/11/21 0018 01/11/21 1132 01/11/21 2052 01/12/21 0201 01/14/21 1253  WBC 11.1* 7.3  --   --  8.3 8.1  NEUTROABS 9.2*  --   --   --  6.1  --   HGB 7.7* 6.6*   < > 8.9* 9.9* 8.6*  HCT 24.7* 19.5*   < > 26.1* 29.2* 26.3*  MCV 100.8* 93.8  --   --  90.4 94.3  PLT 210 179  --   --  161 140*   < > = values in this interval not displayed.   Blood Culture    Component Value Date/Time   SDES CSF 04/14/2020 1827    SPECREQUEST NONE 04/14/2020 1827   CULT  04/14/2020 1827    NO GROWTH 3 DAYS Performed at [facility], [place] [address], [place], [place] [address]    REPTSTATUS 04/18/2020 FINAL 04/14/2020 1827    Cardiac Enzymes: No results for input(s): CKTOTAL, CKMB, CKMBINDEX, TROPONINI in the last 168 hours. CBG: Recent Labs  Lab 01/15/21 0758 01/15/21 1148 01/15/21 1629 01/15/21 2112 01/16/21 0639  GLUCAP 217* 173* 186* 91 83   Iron Studies: No results for input(s): IRON, TIBC, TRANSFERRIN, FERRITIN in the last 72 hours. '@lablastinr3'$ @ Studies/Results: IR Removal Tun Cv Cath W/O FL  Result Date: 01/14/2021 INDICATION: Patient with history of end-stage renal disease now with working AV fistula, request for removal of tunneled right IJ hemodialysis catheter. EXAM: REMOVAL OF TUNNELED HEMODIALYSIS CATHETER MEDICATIONS: 5 mL 1% lidocaine COMPLICATIONS: None immediate. PROCEDURE: Informed written consent was obtained from the patient following an explanation of the procedure, risks, benefits and alternatives to treatment. A time out was performed prior to the initiation of the procedure. Maximal barrier sterile technique  was utilized including caps, mask, sterile gowns, sterile gloves, large sterile drape, hand hygiene, and Hibiclens. 1% lidocaine was injected under sterile conditions along the subcutaneous tunnel. Utilizing a combination of blunt dissection and gentle traction, the catheter was removed intact. Hemostasis was obtained with manual compression. A dressing was placed. The patient tolerated the procedure well without immediate post procedural complication. IMPRESSION: Successful removal of tunneled right IJ dialysis catheter. Read by Candiss Norse, PA-C Electronically Signed   By: Miachel Roux M.D.   On: 01/14/2021 10:28   Medications:  . sodium chloride   Intravenous Once  . bictegravir-emtricitabine-tenofovir AF  1 tablet Oral Daily  . carvedilol  3.125 mg Oral BID WC  .  Chlorhexidine Gluconate Cloth  6 each Topical Q0600  . Chlorhexidine Gluconate Cloth  6 each Topical Q0600  . cholecalciferol  2,000 Units Oral q AM  . clopidogrel  75 mg Oral Daily  . darbepoetin (ARANESP) injection - DIALYSIS  60 mcg Intravenous Q Fri-HD  . doxepin  75 mg Oral QHS  . feeding supplement (NEPRO CARB STEADY)  237 mL Oral TID WC  . heparin  5,000 Units Subcutaneous Q12H  . insulin aspart  0-9 Units Subcutaneous TID WC  . insulin glargine  5 Units Subcutaneous Daily  . levETIRAcetam  500 mg Oral BID  . loratadine  10 mg Oral Daily  . multivitamin  1 tablet Oral QHS  . pantoprazole  40 mg Oral Daily  . pravastatin  80 mg Oral QHS  . sulfamethoxazole-trimethoprim  1 tablet Oral Once per day on Mon Wed Fri  . tamsulosin  0.4 mg Oral Daily    Dialysis Orders:  [facility]S [facility] 4h 400/800 2/2 bath 75kg Hep 2000 LUA AVF/ TDC  Was receiving hectorol 39mg q HD and mircera 565m q 2 weeks  Assessment/Plan:  1. Noncompliance with dialysis:Pt started HD in Aug 2021. His last HD prior to admit here was Oct 02 2020. He was poorly compliant prior to this, often shortening/missing treatments. He felt he did not need dialysis. Presented here w/ BUN 214, Cr 24. We explained that his labs were life threatening. Pt agreed to restart HD. Has had serial HD here x 3 w/ improved azotemia. Vol is okay. Pt intermittently mildly confused vs communication issue d/t aphasia. Used AVF x 3 w/o incident, [facility] HD [facility] planned for tomorrow. 2. Chronic debility: w/ R hemiparesis and aphasia sp CVA. WC bound and SNF dependent.  3. Rectal abscess: Chief complaint on presentation to the ED. Management per primary team. 4. Hypertension/volume:Volume controlled, still makes urine. BP's good, on home coreg.  5. Anemia:Hgb dropped to6.6. Hb 9's sp transfusion here. Was on ESA prior to stopping dialysis. Tsat 32%. Got 60 ug  aranesp here on 4/22. .  6. Metabolic bone disease:Need to reestablish protocols. Phos controlled without binder,  PTH 106- will stop calcitriol. Calcium slightly low, use higher Ca bath.  7. Nutrition:Albumin 3.2. Recommend renal diet, fluid restrictions and protein supplementation.   SaAnice PaganiniPA-C 01/16/2021, 9:53 AM  [facility] Pager: [phone]

## 2021-01-16 NOTE — Discharge Summary (Signed)
Physician Discharge Summary  Richard [name] BDZ:[id number] DOB: [birth year] DOA: 01/10/2021  PCP: [name], MD  Admit date: 01/10/2021 Discharge date: 01/16/2021  Admitted From: Home  Disposition:   SNF   Recommendations for Outpatient Follow-up and new medication changes:  1. Follow up with Dr Ethlyn Gallery in 7 to 10 days.  2. Continue outpatient hemodialysis Tuesday, Thursday, Saturday.  Home Health: na   Equipment/Devices: na    Discharge Condition: stable  CODE STATUS: full  Diet recommendation:  Heart healthy and diabetic prudent.   Brief/Interim Summary: Richard [name] was admitted to the hospital with the working diagnosis of hyperkalemia in the setting of ESRD (non compliant with hemodialysis)  65 year old male past medical history for chronic kidney disease on hemodialysis, type 2 diabetes mellitus, hypertension, dyslipidemia, HIV and seizure disorder who presented with rectal pain. Patient reported stopping hemodialysis about 2 months ago, apparently he was feeling well and making urine. Over the last 4 weeks he experienced rectal pain, which was persistent and worsening, that prompted him to come to the hospital. On his initial physical examination blood pressure 147/49, heart rate 74, respiratory rate 18,his lungs were clear to auscultation bilaterally, heart S1-S2, present, rhythmic, soft abdomen, no lower extremity edema.  Sodium 144, potassium 7.2, chloride 109, bicarb less than 7, glucose 63, BUN 214, creatinine 24.8, white count 11.1, hemoglobin 7.7, hematocrit 24.7, platelets 210. SARS COVID-19 negative.  Chest radiograph with cardiomegaly, hilar vascular congestion bilaterally.  EKG 96 bpm, normal axis, QTC 510, sinus rhythm, Q-wave V1-V2, no significant ST segment or T wave changes, positive LVH.  Patient underwent emergent hemodialysis with correction of hyperkalemia.  Left upper extremity fistula mature and working well. His right IJ  catheter was removed with good toleration   Patient agrees in continue with renal replacement therapy. He is very weak and deconditioned, will be transferred to SNF to continue physical therapy.    Hospital Course:  Acute hyperkalemia in the setting of end-stage renal disease, noncompliant with hemodialysis  Patient was admitted to the progressive care unit, he underwent emergent hemodialysis with good toleration. Hemodialysis access, left upper extremity fistula is maturing working appropriately. Temporary hemodialysis tunnel catheter was removed. Patient has agreed to continue with hemodialysis outpatient. He will be on a TTS schedule.  Anemia of chronic renal disease.   His iron stores showed serum iron of 54, transferrin saturation 32, ferritin 849, TIBC 169. His lowest hemoglobin reached 6.6, he received 1 unit packed red blood cells with good toleration. At discharge his hemoglobin is 8.6, hematocrit 26.3. Continue with darbepoetin on HD  Rectal pain.  Likely due to hemorrhoids, rectal abscess was ruled out, patient received local steroids with good toleration.  Type 2 diabetes mellitus, uncontrolled hypoglycemia/hyperglycemia.  Dyslipidemia Patient was placed on insulin therapy during his hospitalization, including sliding scale and basal.   Tolerating p.o. diet adequately. Continue pravastatin.  HIV.  He will continue antiretroviral therapy along with PJP prophylaxis with Bactrim.  Seizure, history of CVA.  No active seizures during his hospitalization, continue Keppra. Patient on clopidogrel and statin for history of CVA.  Hypertension.  Continue carvedilol for blood pressure control.  Depression.  No agitation, continue doxepin.  Moderate calorie protein malnutrition.  He will continue with nutritional supplements.  Patient is stable for discharge.  He declined COVID-19 test earlier this morning.  Discussed this with him.  He was told that this is required for him to go  to rehab.  He is agreeable.  Discharge Diagnoses:  Principal Problem:   Hyperkalemia Active Problems:   Hyperlipidemia   Hypertension   GERD (gastroesophageal reflux disease)   Diabetes mellitus, type II, insulin dependent (HCC)   History of cerebrovascular accident (CVA) with residual deficit   ESRD (end stage renal disease) ([place])   Anemia in chronic kidney disease   Hemorrhoids    Discharge Instructions  Discharge Instructions    Diet - low sodium heart healthy   Complete by: As directed    Discharge instructions   Complete by: As directed    Follow up with primary care in 7 to 10 days.   Increase activity slowly   Complete by: As directed      Allergies as of 01/16/2021      Reactions   Oxycodone Nausea And Vomiting      Medication List    STOP taking these medications   calcitRIOL 0.25 MCG capsule Commonly known as: ROCALTROL   Narcan 4 MG/0.1ML Liqd nasal spray kit Generic drug: naloxone     TAKE these medications   Accu-Chek Aviva Plus test strip Generic drug: glucose blood 1 each by Other route See admin instructions. Use 1 test strip to test blood sugar two-three times daily What changed: Another medication with the same name was changed. Make sure you understand how and when to take each.   Accu-Chek Aviva Plus test strip Generic drug: glucose blood USE 1 strip TO test blood sugar 2 TIMES DAILY TO 3 TIMES DAILY What changed: See the new instructions.   acetaminophen 325 MG tablet Commonly known as: TYLENOL Take 650 mg by mouth every 6 (six) hours as needed for moderate pain.   bictegravir-emtricitabine-tenofovir AF 50-200-25 MG Tabs tablet Commonly known as: BIKTARVY Take 1 tablet by mouth daily.   blood glucose meter kit and supplies Kit Dispense based on patient and insurance preference. Check blood sugar 4 times a day What changed:   how much to take  how to take this  when to take this   carvedilol 3.125 MG tablet Commonly known  as: COREG Take 1 tablet (3.125 mg total) by mouth 2 (two) times daily with a meal.   cetirizine 10 MG tablet Commonly known as: ZYRTEC Take 10 mg by mouth daily.   clopidogrel 75 MG tablet Commonly known as: PLAVIX Take 1 tablet (75 mg total) by mouth daily.   Commode 3-In-1 Misc Use as directed   Transfer Bench Misc Use as directed   doxepin 75 MG capsule Commonly known as: SINEQUAN Take 75 mg by mouth at bedtime.   feeding supplement (NEPRO CARB STEADY) Liqd Take 237 mLs by mouth 3 (three) times daily with meals.   insulin aspart 100 UNIT/ML injection Commonly known as: novoLOG Inject 0-9 Units into the skin 3 (three) times daily with meals. For glucose 150 to 200 use 2 units, for 201 to 250 use 3 units, for 251 to 300 use 5 units, for 301 to 350 use 7 units for 351 units or above use 9 units.   insulin glargine 100 UNIT/ML injection Commonly known as: LANTUS Inject 0.05 mLs (5 Units total) into the skin at bedtime.   Insulin Pen Needle 31G X 8 MM Misc Commonly known as: Sure Comfort Pen Needles Use as directed twice daily with Novolog flex pen What changed:   how much to take  how to take this  when to take this   levETIRAcetam 500 MG tablet Commonly known as: Keppra Take 1 tablet (500 mg total) by mouth 2 (  two) times daily.   multivitamin with minerals Tabs tablet Take 1 tablet by mouth daily.   pantoprazole 40 MG tablet Commonly known as: PROTONIX TAKE 1 TABLET BY MOUTH EVERY DAY   pravastatin 80 MG tablet Commonly known as: PRAVACHOL Take 1 tablet (80 mg total) by mouth at bedtime.   sulfamethoxazole-trimethoprim 800-160 MG tablet Commonly known as: BACTRIM DS Take 1 tablet by mouth 3 (three) times a week.   tamsulosin 0.4 MG Caps capsule Commonly known as: FLOMAX TAKE 1 CAPSULE BY MOUTH EVERY DAY   triamcinolone cream 0.1 % Commonly known as: KENALOG Apply 1 application topically 2 (two) times daily.   TRUEplus Insulin Syringe 31G X 5/16" 1  ML Misc Generic drug: Insulin Syringe-Needle U-100 Use pen needle with insulin 2 times daily What changed: See the new instructions.   Vitamin D 50 MCG (2000 UT) tablet Take 1 tablet (2,000 Units total) by mouth in the morning.   zolpidem 5 MG tablet Commonly known as: AMBIEN Take 5 mg by mouth at bedtime as needed for sleep.       Follow-up Information    [name], MD Follow up in 1 week(s).   Specialty: Family Medicine Contact information: [address] [place] [address] [phone]              Allergies  Allergen Reactions  . Oxycodone Nausea And Vomiting    Consultations:  Nephrology   IR   Procedures/Studies: DG Pelvis 1-2 Views  Result Date: 01/10/2021 CLINICAL DATA:  Pelvic pain. No reported injury. 06/18/2009. EXAM: PELVIS - 1-2 VIEW COMPARISON:  Abdomen and pelvis CT dated 08/09/2020. FINDINGS: Normal appearing pelvic bones. Unremarkable hips. Atheromatous arterial calcifications. IMPRESSION: Normal appearing hips. Electronically Signed   By: Claudie Revering M.D.   On: 01/10/2021 14:45   IR Removal Tun Cv Cath W/O FL  Result Date: 01/14/2021 INDICATION: Patient with history of end-stage renal disease now with working AV fistula, request for removal of tunneled right IJ hemodialysis catheter. EXAM: REMOVAL OF TUNNELED HEMODIALYSIS CATHETER MEDICATIONS: 5 mL 1% lidocaine COMPLICATIONS: None immediate. PROCEDURE: Informed written consent was obtained from the patient following an explanation of the procedure, risks, benefits and alternatives to treatment. A time out was performed prior to the initiation of the procedure. Maximal barrier sterile technique was utilized including caps, mask, sterile gowns, sterile gloves, large sterile drape, hand hygiene, and Hibiclens. 1% lidocaine was injected under sterile conditions along the subcutaneous tunnel. Utilizing a combination of blunt dissection and gentle traction, the catheter was removed  intact. Hemostasis was obtained with manual compression. A dressing was placed. The patient tolerated the procedure well without immediate post procedural complication. IMPRESSION: Successful removal of tunneled right IJ dialysis catheter. Read by Candiss Norse, PA-C Electronically Signed   By: Miachel Roux M.D.   On: 01/14/2021 10:28   DG Chest Portable 1 View  Result Date: 01/10/2021 CLINICAL DATA:  Missed dialysis.  Rule out fluid overload EXAM: PORTABLE CHEST 1 VIEW COMPARISON:  08/08/2020 FINDINGS: Right jugular dialysis catheter tip at the cavoatrial junction unchanged. Cardiac enlargement. Normal vascularity. Negative for edema or effusion. Negative for pneumonia. IMPRESSION: No active disease. Electronically Signed   By: Franchot Gallo M.D.   On: 01/10/2021 13:46     Procedures:  Right tunnled HD catheter removal.   Discharge Exam: Vitals:   01/15/21 2351 01/16/21 0331  BP: (!) 166/79 (!) 141/73  Pulse: 100 91  Resp: 18 18  Temp: 98.5 F (36.9 C) 98.5  F (36.9 C)  SpO2: 100% 97%   Vitals:   01/15/21 2036 01/15/21 2351 01/16/21 0331 01/16/21 0428  BP: (!) 155/75 (!) 166/79 (!) 141/73   Pulse: 68 100 91   Resp: '18 18 18   ' Temp: 98.5 F (36.9 C) 98.5 F (36.9 C) 98.5 F (36.9 C)   TempSrc: Oral Oral Oral   SpO2: (!) 61% 100% 97%   Weight:    64.8 kg    General appearance: Awake alert.  In no distress Resp: Clear to auscultation bilaterally.  Normal effort Cardio: S1-S2 is normal regular.  No S3-S4.  No rubs murmurs or bruit GI: Abdomen is soft.  Nontender nondistended.  Bowel sounds are present normal.  No masses organomegaly   The results of significant diagnostics from this hospitalization (including imaging, microbiology, ancillary and laboratory) are listed below for reference.     Microbiology: Recent Results (from the past 240 hour(s))  Resp Panel by RT-PCR (Flu A&B, Covid) Nasopharyngeal Swab     Status: None   Collection Time: 01/10/21  1:56 PM    Specimen: Nasopharyngeal Swab; Nasopharyngeal(NP) swabs in vial transport medium  Result Value Ref Range Status   SARS Coronavirus 2 by RT PCR NEGATIVE NEGATIVE Final    Comment: (NOTE) SARS-CoV-2 target nucleic acids are NOT DETECTED.  The SARS-CoV-2 RNA is generally detectable in upper respiratory specimens during the acute phase of infection. The lowest concentration of SARS-CoV-2 viral copies this assay can detect is 138 copies/mL. A negative result does not preclude SARS-Cov-2 infection and should not be used as the sole basis for treatment or other patient management decisions. A negative result may occur with  improper specimen collection/handling, submission of specimen other than nasopharyngeal swab, presence of viral mutation(s) within the areas targeted by this assay, and inadequate number of viral copies(<138 copies/mL). A negative result must be combined with clinical observations, patient history, and epidemiological information. The expected result is Negative.  Fact Sheet for Patients:  EntrepreneurPulse.com.au  Fact Sheet for Healthcare Providers:  IncredibleEmployment.be  This test is no t yet approved or cleared by the [place] FDA and  has been authorized for detection and/or diagnosis of SARS-CoV-2 by FDA under an Emergency Use Authorization (EUA). This EUA will remain  in effect (meaning this test can be used) for the duration of the COVID-19 declaration under Section 564(b)(1) of the Act, 21 U.S.C.section 360bbb-3(b)(1), unless the authorization is terminated  or revoked sooner.       Influenza A by PCR NEGATIVE NEGATIVE Final   Influenza B by PCR NEGATIVE NEGATIVE Final    Comment: (NOTE) The Xpert Xpress SARS-CoV-2/FLU/RSV plus assay is intended as an aid in the diagnosis of influenza from Nasopharyngeal swab specimens and should not be used as a sole basis for treatment. Nasal washings and aspirates are  unacceptable for Xpert Xpress SARS-CoV-2/FLU/RSV testing.  Fact Sheet for Patients: EntrepreneurPulse.com.au  Fact Sheet for Healthcare Providers: IncredibleEmployment.be  This test is not yet approved or cleared by the [place] FDA and has been authorized for detection and/or diagnosis of SARS-CoV-2 by FDA under an Emergency Use Authorization (EUA). This EUA will remain in effect (meaning this test can be used) for the duration of the COVID-19 declaration under Section 564(b)(1) of the Act, 21 U.S.C. section 360bbb-3(b)(1), unless the authorization is terminated or revoked.  Performed at [facility], [place] [address], [place], [place] [address]   MRSA PCR Screening     Status: None   Collection Time:  01/10/21  8:34 PM   Specimen: Nasopharyngeal  Result Value Ref Range Status   MRSA by PCR NEGATIVE NEGATIVE Final    Comment:        The GeneXpert MRSA Assay (FDA approved for NASAL specimens only), is one component of a comprehensive MRSA colonization surveillance program. It is not intended to diagnose MRSA infection nor to guide or monitor treatment for MRSA infections. Performed at [facility], [place] [address] [place], [place] [address]      Labs: BNP (last 3 results) No results for input(s): BNP in the last 8760 hours. Basic Metabolic Panel: Recent Labs  Lab 01/10/21 1255 01/11/21 0018 01/12/21 0201 01/13/21 0047 01/14/21 0115 01/14/21 1253  NA 144 142 137 138 137  --   K 7.2* 3.9 3.8 3.8 4.0  --   CL 109 104 100 100 103  --   CO2 <7* 15* 23 26 21*  --   GLUCOSE 63* 29* 79 233* 271*  --   BUN 214* 117* 62* 42* 59*  --   CREATININE 24.82* 15.22* 9.49* 7.21* 9.13*  --   CALCIUM 7.7* 7.9* 8.1* 8.4* 8.2* 7.7*  PHOS  --   --   --   --   --  5.0*   Liver Function Tests: Recent Labs  Lab 01/10/21 1255  AST 24  ALT 17  ALKPHOS 147*  BILITOT 0.8  PROT 7.5  ALBUMIN 3.2*   No results for input(s):  LIPASE, AMYLASE in the last 168 hours. No results for input(s): AMMONIA in the last 168 hours. CBC: Recent Labs  Lab 01/10/21 1255 01/11/21 0018 01/11/21 1132 01/11/21 2052 01/12/21 0201 01/14/21 1253  WBC 11.1* 7.3  --   --  8.3 8.1  NEUTROABS 9.2*  --   --   --  6.1  --   HGB 7.7* 6.6* 7.5* 8.9* 9.9* 8.6*  HCT 24.7* 19.5* 21.9* 26.1* 29.2* 26.3*  MCV 100.8* 93.8  --   --  90.4 94.3  PLT 210 179  --   --  161 140*   Cardiac Enzymes: No results for input(s): CKTOTAL, CKMB, CKMBINDEX, TROPONINI in the last 168 hours. BNP: Invalid input(s): POCBNP CBG: Recent Labs  Lab 01/15/21 1148 01/15/21 1629 01/15/21 2112 01/16/21 0639 01/16/21 1106  GLUCAP 173* 186* 91 83 94   Microbiology Recent Results (from the past 240 hour(s))  Resp Panel by RT-PCR (Flu A&B, Covid) Nasopharyngeal Swab     Status: None   Collection Time: 01/10/21  1:56 PM   Specimen: Nasopharyngeal Swab; Nasopharyngeal(NP) swabs in vial transport medium  Result Value Ref Range Status   SARS Coronavirus 2 by RT PCR NEGATIVE NEGATIVE Final    Comment: (NOTE) SARS-CoV-2 target nucleic acids are NOT DETECTED.  The SARS-CoV-2 RNA is generally detectable in upper respiratory specimens during the acute phase of infection. The lowest concentration of SARS-CoV-2 viral copies this assay can detect is 138 copies/mL. A negative result does not preclude SARS-Cov-2 infection and should not be used as the sole basis for treatment or other patient management decisions. A negative result may occur with  improper specimen collection/handling, submission of specimen other than nasopharyngeal swab, presence of viral mutation(s) within the areas targeted by this assay, and inadequate number of viral copies(<138 copies/mL). A negative result must be combined with clinical observations, patient history, and epidemiological information. The expected result is Negative.  Fact Sheet for Patients:   EntrepreneurPulse.com.au  Fact Sheet for Healthcare Providers:  IncredibleEmployment.be  This  test is no t yet approved or cleared by the [place] and  has been authorized for detection and/or diagnosis of SARS-CoV-2 by FDA under an Emergency Use Authorization (EUA). This EUA will remain  in effect (meaning this test can be used) for the duration of the COVID-19 declaration under Section 564(b)(1) of the Act, 21 U.S.C.section 360bbb-3(b)(1), unless the authorization is terminated  or revoked sooner.       Influenza A by PCR NEGATIVE NEGATIVE Final   Influenza B by PCR NEGATIVE NEGATIVE Final    Comment: (NOTE) The Xpert Xpress SARS-CoV-2/FLU/RSV plus assay is intended as an aid in the diagnosis of influenza from Nasopharyngeal swab specimens and should not be used as a sole basis for treatment. Nasal washings and aspirates are unacceptable for Xpert Xpress SARS-CoV-2/FLU/RSV testing.  Fact Sheet for Patients: EntrepreneurPulse.com.au  Fact Sheet for Healthcare Providers: IncredibleEmployment.be  This test is not yet approved or cleared by the [place] FDA and has been authorized for detection and/or diagnosis of SARS-CoV-2 by FDA under an Emergency Use Authorization (EUA). This EUA will remain in effect (meaning this test can be used) for the duration of the COVID-19 declaration under Section 564(b)(1) of the Act, 21 U.S.C. section 360bbb-3(b)(1), unless the authorization is terminated or revoked.  Performed at [facility], [place] [address] [place], [place] [address]   MRSA PCR Screening     Status: None   Collection Time: 01/10/21  8:34 PM   Specimen: Nasopharyngeal  Result Value Ref Range Status   MRSA by PCR NEGATIVE NEGATIVE Final    Comment:        The GeneXpert MRSA Assay (FDA approved for NASAL specimens only), is one component of a comprehensive MRSA  colonization surveillance program. It is not intended to diagnose MRSA infection nor to guide or monitor treatment for MRSA infections. Performed at [facility], [place] [address] [place], [place] [address]      Time coordinating discharge: 35 minutes  SIGNED:   Bonnielee Haff, MD  [facility] 01/16/2021, 11:34 AM

## 2021-01-16 NOTE — Progress Notes (Signed)
Patient awake and alert and orin. And refusing Covid Test to be done. Explain need test to go to [facility]. He does not want to go back to Nursing home. M.D. please address.

## 2021-01-16 NOTE — Progress Notes (Signed)
Renal Navigator notes documentation that patient is refusing to return to SNF. Navigator is concerned that his outpatient HD clinic seat will not be held for long, given high demand, as well as reluctance to readmit patient due to his hx of noncompliance.  Renal Navigator spoke with TOC CSW, who states she has spoken with patient and his daughter to request that she also speak with him about his refusal to return to SNF.  Navigator discussed with MD at [facility] regarding patient's refusal to return to SNF. She states she will only accept him back if he is in a facility, receiving 24 hr care, since this is the recommendation. Navigator also called patient's daughter, to discuss the concern that patient will lose his seat at the HD clinic if he does not return to SNF and that the seat will not be held for long, given his non-compliant hx and high demand for outpatient HD in general. Patient's daughter states that she is tired of patient not caring about himself and she cannot take it anymore. She clearly states that she cannot care for him and that he cannot care for himself. She says she spoke with him when the Social Worker called her earlier, but all patient said was "when are you going to bring my milk and cereal?" Patient's daughter says she has her own life and cannot keep doing this. Patient told her he has money and he is going to get a hotel room. She is not able to make him do anything and is exhausted with how frustrating patient is. She is "done." Navigator expressed gratitude for all the care she has given patient over the years and for knowing her boundaries and limits. Navigator asked if she would like to be removed as an emergency contact, but she did not make this decision at this time. Patient's daughter stated understanding that patient will need to return to SNF if he is going to be re-admitted to outpatient HD, but states, "he says he is going to go, but he isn't going to go." She is frustrated  that he stopped going previously and told Navigator that she already discussed with him that he was not supposed to stop. She was was clear and direct that if he is not going care about himself, she is not going to care about him either. Navigator feels strongly that it is time for an updated [place] discussion with Palliative Medicine, since patient is refusing recommendations, has unrealistic expectations of what he is capable of doing, and may be saying what the medical team wants to hear with no intentions of follow through. Patient's daughter whole-heartedly agreed and states she has asked about follow up with Palliative Care. She would very much like a discussion like this to happen as soon as possible.  Navigator reached out to Attending/Dr. [place] Pink and Nephrologist/Dr. Johnney Ou and appreciates a consult being ordered. Navigator will follow up with PMT staff.  Navigator has notified HD clinic that patient will not be there tomorrow, as he is refusing planned discharge today.  Navigator will continue to follow closely.   Alphonzo Cruise, [place] Renal Navigator [phone]

## 2021-01-17 LAB — CBC
HCT: 25.4 % — ABNORMAL LOW (ref 39.0–52.0)
Hemoglobin: 8.1 g/dL — ABNORMAL LOW (ref 13.0–17.0)
MCH: 30.7 pg (ref 26.0–34.0)
MCHC: 31.9 g/dL (ref 30.0–36.0)
MCV: 96.2 fL (ref 80.0–100.0)
Platelets: 169 10*3/uL (ref 150–400)
RBC: 2.64 MIL/uL — ABNORMAL LOW (ref 4.22–5.81)
RDW: 16.5 % — ABNORMAL HIGH (ref 11.5–15.5)
WBC: 8.9 10*3/uL (ref 4.0–10.5)
nRBC: 0 % (ref 0.0–0.2)

## 2021-01-17 LAB — BASIC METABOLIC PANEL
Anion gap: 17 — ABNORMAL HIGH (ref 5–15)
BUN: 71 mg/dL — ABNORMAL HIGH (ref 8–23)
CO2: 19 mmol/L — ABNORMAL LOW (ref 22–32)
Calcium: 8 mg/dL — ABNORMAL LOW (ref 8.9–10.3)
Chloride: 101 mmol/L (ref 98–111)
Creatinine, Ser: 10.61 mg/dL — ABNORMAL HIGH (ref 0.61–1.24)
GFR, Estimated: 5 mL/min — ABNORMAL LOW (ref >60)
Glucose, Bld: 83 mg/dL (ref 70–99)
Potassium: 4.3 mmol/L (ref 3.5–5.1)
Sodium: 137 mmol/L (ref 135–145)

## 2021-01-17 LAB — GLUCOSE, CAPILLARY
Glucose-Capillary: 243 mg/dL — ABNORMAL HIGH (ref 70–99)
Glucose-Capillary: 104 mg/dL — ABNORMAL HIGH (ref 70–99)

## 2021-01-17 MED ORDER — DARBEPOETIN ALFA 60 MCG/0.3ML IJ SOSY
PREFILLED_SYRINGE | INTRAMUSCULAR | Status: AC
  Filled 2021-01-17: qty 0.3

## 2021-01-17 NOTE — Progress Notes (Signed)
Pt has been refusing most of his medications. Pt keeps asking for grape juice,he stated he doesn't drink water. Refused to take insulin this am, blood sugar 243. Pt stated " I don't need insulin". Flat affect. Call bell within reach. Will continue to monitor.

## 2021-01-17 NOTE — Progress Notes (Signed)
Palliative-   Consult received. Discussed with Terri Piedra, LCSW and Dr. Johnney Ou.  Chart reviewed.  Attempted to see patient but he was off unit- in HD.  Will revisit him tomorrow.   Mariana Kaufman, AGNP-C Palliative Medicine  No charge  Please call Palliative Medicine team phone with any questions [phone].

## 2021-01-17 NOTE — Progress Notes (Signed)
Richard [name] Progress Note   Subjective:   Refused to go to SNF yesterday, wants to go home but concern about ability to care for himself. Palliative care consulted. He reports feeling well today, denies SOB, CP, palpitations, dizziness, abdominal pain and nausea. Agrees to HD here today while discharge plan is determined.   Objective Vitals:   01/17/21 0337 01/17/21 0600 01/17/21 0700 01/17/21 0800  BP: (!) 162/85   (!) 182/95  Pulse: 100 99 99 98  Resp: 18   16  Temp: 98.6 F (37 C)   98.1 F (36.7 C)  TempSrc: Oral   Oral  SpO2: 99% 98% 98% 100%  Weight:       Physical Exam General: Well developed, chronically ill appearing male, in NAD Heart: RRR, no murmurs, rubs or gallops Lungs: CTA anteriorly without wheezing, rhonchi or rales Abdomen: Soft, non-tender, non-distended, +BS Extremities: No edema b/l lower extremities Dialysis Access:Former [facility] site with clean/dry. LUE AVF + bruit Neuro: Alert, answers questions appropriately. R sided extremity weakness  Additional Objective Labs: Basic Metabolic Panel: Recent Labs  Lab 01/12/21 0201 01/13/21 0047 01/14/21 0115 01/14/21 1253  NA 137 138 137  --   K 3.8 3.8 4.0  --   CL 100 100 103  --   CO2 23 26 21*  --   GLUCOSE 79 233* 271*  --   BUN 62* 42* 59*  --   CREATININE 9.49* 7.21* 9.13*  --   CALCIUM 8.1* 8.4* 8.2* 7.7*  PHOS  --   --   --  5.0*   Liver Function Tests: Recent Labs  Lab 01/10/21 1255  AST 24  ALT 17  ALKPHOS 147*  BILITOT 0.8  PROT 7.5  ALBUMIN 3.2*   CBC: Recent Labs  Lab 01/10/21 1255 01/11/21 0018 01/11/21 1132 01/11/21 2052 01/12/21 0201 01/14/21 1253  WBC 11.1* 7.3  --   --  8.3 8.1  NEUTROABS 9.2*  --   --   --  6.1  --   HGB 7.7* 6.6*   < > 8.9* 9.9* 8.6*  HCT 24.7* 19.5*   < > 26.1* 29.2* 26.3*  MCV 100.8* 93.8  --   --  90.4 94.3  PLT 210 179  --   --  161 140*   < > = values in this interval not displayed.   Blood Culture    Component Value  Date/Time   SDES CSF 04/14/2020 1827   SPECREQUEST NONE 04/14/2020 1827   CULT  04/14/2020 1827    NO GROWTH 3 DAYS Performed at [facility], [place] [address], [place], [place] [address]    REPTSTATUS 04/18/2020 FINAL 04/14/2020 1827   CBG: Recent Labs  Lab 01/16/21 0639 01/16/21 1106 01/16/21 1631 01/16/21 2107 01/17/21 0625  GLUCAP 83 94 232* 129* 243*   Medications:  . sodium chloride   Intravenous Once  . bictegravir-emtricitabine-tenofovir AF  1 tablet Oral Daily  . carvedilol  3.125 mg Oral BID WC  . Chlorhexidine Gluconate Cloth  6 each Topical Q0600  . Chlorhexidine Gluconate Cloth  6 each Topical Q0600  . cholecalciferol  2,000 Units Oral q AM  . clopidogrel  75 mg Oral Daily  . darbepoetin (ARANESP) injection - DIALYSIS  60 mcg Intravenous Q Fri-HD  . doxepin  75 mg Oral QHS  . heparin  5,000 Units Subcutaneous Q12H  . insulin aspart  0-9 Units Subcutaneous TID WC  . insulin glargine  5 Units Subcutaneous Daily  .  levETIRAcetam  500 mg Oral BID  . loratadine  10 mg Oral Daily  . multivitamin  1 tablet Oral QHS  . pantoprazole  40 mg Oral Daily  . pravastatin  80 mg Oral QHS  . sulfamethoxazole-trimethoprim  1 tablet Oral Once per day on Mon Wed Fri  . tamsulosin  0.4 mg Oral Daily    Outpatient Dialysis Orders: [facility] 4h 400/800 2/2 bath 75kg Hep 2000 LUA AVF/ TDC  Was receiving hectorol 37mg q HD and mircera 526m q 2 weeks  Assessment/Plan:  1. Noncompliance with dialysis:Pt started HD in Aug 2021. His last HD prior to admit here was Oct 02 2020. He was poorly compliant prior to this, often shortening/missing treatments. He felt he did not need dialysis. Presented here w/ BUN 214, Cr 24. We explained that his labs were life threatening. Pt agreed to restart HD. Has had serial HD here x 3 w/ improved azotemia. Vol is okay. Ptintermittentlymildly confused vs communication issue d/t aphasia. Used AV[facility] x 3 w/o incident, [facility], but now refusing SNF so d/c cancelled. Will have HD here today.  2. Chronic debility: w/ R hemiparesis and aphasia sp CVA. WC bound and SNF dependent.  3. Rectal abscess: Chief complaint on presentation to the ED. Management per primary team. 4. Hypertension/volume:Volume controlled, still makes urine. BP's good, on home coreg.  5. Anemia:Hgb dropped to6.6. Hb 9's sp transfusion here. Was on ESA prior to stopping dialysis. Tsat 32%. Got 60 ug aranesp here on 4/22. .  6. Metabolic bone disease:Need to reestablish protocols.Phos controlled without binder, PTH 106- will stop calcitriol. Calcium slightly low, use higher Ca bath.  7. Nutrition:Albumin 3.2. Recommend renal diet, fluid restrictions and protein supplementation.    SaAnice PaganiniPA-C 01/17/2021, 9:17 AM  [name] Pager: [phone]

## 2021-01-17 NOTE — Progress Notes (Signed)
I.V. was out when went to assess patient. I.V. team attempt restart with [place] was not able to get site. Patient refusing to be stuck again. Text page Dr. Olevia Bowens  To make him aware.

## 2021-01-17 NOTE — Progress Notes (Deleted)
Upon IV insertion , Pt. told this IV  Nurse to pull out IV cath and stated " because it hurts". Pt.'s  Grabbed this IV nurse's hand holding the IV cannula. Unable to  Thread. IV cath pulled out. Pt. Stated  " I don't  want to be stuck again, that hurts!" Procedure was explained to the pt. Prior to insertion and verbally agreed to it.Ultarsound used on Right arm, slightly contracted, left arm is restricted.  RN called to bedside and informed.

## 2021-01-17 NOTE — Plan of Care (Signed)

## 2021-01-17 NOTE — Progress Notes (Signed)
[facility] PROGRESS NOTE   Richard [name] [id number] DOB: 11/07/1955 DOA: 01/10/2021  PCP: [name], MD  Brief History/Interval Summary: Mr. Richard [name] was admitted to the hospital with the working diagnosis of hyperkalemia in the setting of ESRD (non compliant with hemodialysis)  65 year old male past medical history for chronic kidney disease on hemodialysis, type 2 diabetes mellitus, hypertension, dyslipidemia, HIV and seizure disorder who presented with rectal pain. Patient reported stopping hemodialysis about 2 months ago, apparently he was feeling well and making urine. Over the last 4 weeks he experienced rectal pain, which was persistent and worsening, that prompted him to come to the hospital. On his initial physical examination blood pressure 147/49, heart rate 74, respiratory rate 18,his lungs were clear to auscultation bilaterally, heart S1-S2, present, rhythmic, soft abdomen, no lower extremity edema.  Sodium 144, potassium 7.2, chloride 109, bicarb less than 7, glucose 63, BUN 214, creatinine 24.8, white count 11.1, hemoglobin 7.7, hematocrit 24.7, platelets 210. SARS COVID-19 negative.  Chest radiograph with cardiomegaly, hilar vascular congestion bilaterally.  EKG 96 bpm, normal axis, QTC 510, sinus rhythm, Q-wave V1-V2, no significant ST segment or T wave changes, positive LVH.  Patient underwent emergent hemodialysis with correction of hyperkalemia.  Left upper extremity fistula mature and working well. His right IJ catheter was removed with good toleration    Consultants: Nephrology  Procedures:  Right tunnled HD catheter removal.   Antibiotics: Anti-infectives (From admission, onward)   Start     Dose/Rate Route Frequency Ordered Stop   01/11/21 1200  bictegravir-emtricitabine-tenofovir AF (BIKTARVY) 50-200-25 MG per tablet 1 tablet        1 tablet Oral Daily 01/10/21 1616     01/11/21 0900  sulfamethoxazole-trimethoprim  (BACTRIM DS) 800-160 MG per tablet 1 tablet        1 tablet Oral Once per day on Mon Wed Fri 01/10/21 1616        Subjective/Interval History: Patient declined to go to SNF yesterday.  Discussed with him about this this morning.  Once again explained to the patient the importance of going to a skilled nursing facility for rehab.  He has had a stroke with right-sided weakness.  He lives by himself.  He was told that he will be unable to take care of his medical needs.  At this moment he does mention that he understands and agrees.  He also agrees that he needs dialysis.     Assessment/Plan:   Acute hyperkalemia in the setting of end-stage renal disease, noncompliant with hemodialysis  Patient underwent emergent hemodialysis with good toleration. Hemodialysis access, left upper extremity fistula is mature and working appropriately. Temporary hemodialysis tunnel catheter was removed.  Neurology is following.  He is on TTS schedule.  Outpatient dialysis has been established but patient has been going back and forth.  Palliative care to see patient today.  Anemia of chronic renal disease.   His iron stores showed serum iron of 54, transferrin saturation 32, ferritin 849, TIBC 169. His lowest hemoglobin reached 6.6, he received 1 unit packed red blood cells with good toleration. Hemoglobin was 8.6 on 4/25.  We will check it today with dialysis. Continue with darbepoetin on HD  Rectal pain Likely due to hemorrhoids, rectal abscess was ruled out, patient received local steroids with good toleration.  Type 2 diabetes mellitus, uncontrolled hypoglycemia/hyperglycemia.  Dyslipidemia HbA1c 5.6.  Currently on Lantus 5 units daily and SSI.  CBGs are reasonably well controlled.  Continue pravastatin.  HIV He will  continue antiretroviral therapy along with PJP prophylaxis with Bactrim.  Seizure, history of CVA No active seizures during his hospitalization, continue Keppra. Patient on  clopidogrel and statin for history of CVA.  Essential hypertension.   Continue carvedilol for blood pressure control.  Depression.   No agitation, continue doxepin.  Moderate calorie protein malnutrition.   He will continue with nutritional supplements. Nutrition Problem: Moderate Malnutrition Etiology: chronic illness (ESRD on HD)  Signs/Symptoms: mild fat depletion,moderate fat depletion,moderate muscle depletion,mild muscle depletion  Interventions: MVI,Nepro shake    DVT Prophylaxis: Subcutaneous heparin Code Status: Full code Family Communication: No family at bedside Disposition Plan: Cleared for discharge to SNF but patient has been declining.  To be seen by palliative care today.  Status is: Inpatient  Remains inpatient appropriate because:Unsafe d/c plan   Dispo: The patient is from: Home              Anticipated d/c is to: SNF              Patient currently is medically stable to d/c.   Difficult to place patient No       Medications:  Scheduled: . sodium chloride   Intravenous Once  . bictegravir-emtricitabine-tenofovir AF  1 tablet Oral Daily  . carvedilol  3.125 mg Oral BID WC  . Chlorhexidine Gluconate Cloth  6 each Topical Q0600  . Chlorhexidine Gluconate Cloth  6 each Topical Q0600  . cholecalciferol  2,000 Units Oral q AM  . clopidogrel  75 mg Oral Daily  . darbepoetin (ARANESP) injection - DIALYSIS  60 mcg Intravenous Q Fri-HD  . doxepin  75 mg Oral QHS  . heparin  5,000 Units Subcutaneous Q12H  . insulin aspart  0-9 Units Subcutaneous TID WC  . insulin glargine  5 Units Subcutaneous Daily  . levETIRAcetam  500 mg Oral BID  . loratadine  10 mg Oral Daily  . multivitamin  1 tablet Oral QHS  . pantoprazole  40 mg Oral Daily  . pravastatin  80 mg Oral QHS  . sulfamethoxazole-trimethoprim  1 tablet Oral Once per day on Mon Wed Fri  . tamsulosin  0.4 mg Oral Daily   Continuous:  [id number] **OR** acetaminophen, lidocaine (PF),  ondansetron **OR** ondansetron (ZOFRAN) IV, zolpidem   Objective:  Vital Signs  Vitals:   01/17/21 0337 01/17/21 0600 01/17/21 0700 01/17/21 0800  BP: (!) 162/85   (!) 182/95  Pulse: 100 99 99 98  Resp: 18   16  Temp: 98.6 F (37 C)   98.1 F (36.7 C)  TempSrc: Oral   Oral  SpO2: 99% 98% 98% 100%  Weight:        Intake/Output Summary (Last 24 hours) at 01/17/2021 1048 Last data filed at 01/17/2021 0230 Gross per 24 hour  Intake 200 ml  Output --  Net 200 ml   Filed Weights   01/14/21 1255 01/14/21 1537 01/16/21 0428  Weight: 65.6 kg 65.2 kg 64.8 kg    General appearance: Awake alert.  In no distress Resp: Clear to auscultation bilaterally.  Normal effort Cardio: S1-S2 is normal regular.  No S3-S4.  No rubs murmurs or bruit GI: Abdomen is soft.  Nontender nondistended.  Bowel sounds are present normal.  No masses organomegaly Neurologic: Has right-sided hemiparesis from previous stroke.  No new deficits.   Lab Results:  Data Reviewed: I have personally reviewed following labs and imaging studies  CBC: Recent Labs  Lab 01/10/21 1255 01/11/21 0018 01/11/21 1132 01/11/21 2052 01/12/21 0201  01/14/21 1253  WBC 11.1* 7.3  --   --  8.3 8.1  NEUTROABS 9.2*  --   --   --  6.1  --   HGB 7.7* 6.6* 7.5* 8.9* 9.9* 8.6*  HCT 24.7* 19.5* 21.9* 26.1* 29.2* 26.3*  MCV 100.8* 93.8  --   --  90.4 94.3  PLT 210 179  --   --  161 140*    Basic Metabolic Panel: Recent Labs  Lab 01/10/21 1255 01/11/21 0018 01/12/21 0201 01/13/21 0047 01/14/21 0115 01/14/21 1253  NA 144 142 137 138 137  --   K 7.2* 3.9 3.8 3.8 4.0  --   CL 109 104 100 100 103  --   CO2 <7* 15* 23 26 21*  --   GLUCOSE 63* 29* 79 233* 271*  --   BUN 214* 117* 62* 42* 59*  --   CREATININE 24.82* 15.22* 9.49* 7.21* 9.13*  --   CALCIUM 7.7* 7.9* 8.1* 8.4* 8.2* 7.7*  PHOS  --   --   --   --   --  5.0*    GFR: Estimated Creatinine Clearance: 7.6 mL/min (A) (by C-G formula based on SCr of 9.13 mg/dL  (H)).  Liver Function Tests: Recent Labs  Lab 01/10/21 1255  AST 24  ALT 17  ALKPHOS 147*  BILITOT 0.8  PROT 7.5  ALBUMIN 3.2*     CBG: Recent Labs  Lab 01/16/21 0639 01/16/21 1106 01/16/21 1631 01/16/21 2107 01/17/21 0625  GLUCAP 83 94 232* 129* 243*      Recent Results (from the past 240 hour(s))  Resp Panel by RT-PCR (Flu A&B, Covid) Nasopharyngeal Swab     Status: None   Collection Time: 01/10/21  1:56 PM   Specimen: Nasopharyngeal Swab; Nasopharyngeal(NP) swabs in vial transport medium  Result Value Ref Range Status   SARS Coronavirus 2 by RT PCR NEGATIVE NEGATIVE Final    Comment: (NOTE) SARS-CoV-2 target nucleic acids are NOT DETECTED.  The SARS-CoV-2 RNA is generally detectable in upper respiratory specimens during the acute phase of infection. The lowest concentration of SARS-CoV-2 viral copies this assay can detect is 138 copies/mL. A negative result does not preclude SARS-Cov-2 infection and should not be used as the sole basis for treatment or other patient management decisions. A negative result may occur with  improper specimen collection/handling, submission of specimen other than nasopharyngeal swab, presence of viral mutation(s) within the areas targeted by this assay, and inadequate number of viral copies(<138 copies/mL). A negative result must be combined with clinical observations, patient history, and epidemiological information. The expected result is Negative.  Fact Sheet for Patients:  EntrepreneurPulse.com.au  Fact Sheet for Healthcare Providers:  IncredibleEmployment.be  This test is no t yet approved or cleared by the [place] FDA and  has been authorized for detection and/or diagnosis of SARS-CoV-2 by FDA under an Emergency Use Authorization (EUA). This EUA will remain  in effect (meaning this test can be used) for the duration of the COVID-19 declaration under Section 564(b)(1) of the Act,  21 U.S.C.section 360bbb-3(b)(1), unless the authorization is terminated  or revoked sooner.       Influenza A by PCR NEGATIVE NEGATIVE Final   Influenza B by PCR NEGATIVE NEGATIVE Final    Comment: (NOTE) The Xpert Xpress SARS-CoV-2/FLU/RSV plus assay is intended as an aid in the diagnosis of influenza from Nasopharyngeal swab specimens and should not be used as a sole basis for treatment. Nasal washings and aspirates are unacceptable  for Xpert Xpress SARS-CoV-2/FLU/RSV testing.  Fact Sheet for Patients: EntrepreneurPulse.com.au  Fact Sheet for Healthcare Providers: IncredibleEmployment.be  This test is not yet approved or cleared by the [place] FDA and has been authorized for detection and/or diagnosis of SARS-CoV-2 by FDA under an Emergency Use Authorization (EUA). This EUA will remain in effect (meaning this test can be used) for the duration of the COVID-19 declaration under Section 564(b)(1) of the Act, 21 U.S.C. section 360bbb-3(b)(1), unless the authorization is terminated or revoked.  Performed at [facility], [place] [address], [place], [place] [address]   MRSA PCR Screening     Status: None   Collection Time: 01/10/21  8:34 PM   Specimen: Nasopharyngeal  Result Value Ref Range Status   MRSA by PCR NEGATIVE NEGATIVE Final    Comment:        The GeneXpert MRSA Assay (FDA approved for NASAL specimens only), is one component of a comprehensive MRSA colonization surveillance program. It is not intended to diagnose MRSA infection nor to guide or monitor treatment for MRSA infections. Performed at [facility], [place] [address], [place], [place] [address]   SARS CORONAVIRUS 2 (TAT 6-24 HRS) Nasopharyngeal Nasopharyngeal Swab     Status: None   Collection Time: 01/16/21  1:05 PM   Specimen: Nasopharyngeal Swab  Result Value Ref Range Status   SARS Coronavirus 2 NEGATIVE NEGATIVE Final    Comment:  (NOTE) SARS-CoV-2 target nucleic acids are NOT DETECTED.  The SARS-CoV-2 RNA is generally detectable in upper and lower respiratory specimens during the acute phase of infection. Negative results do not preclude SARS-CoV-2 infection, do not rule out co-infections with other pathogens, and should not be used as the sole basis for treatment or other patient management decisions. Negative results must be combined with clinical observations, patient history, and epidemiological information. The expected result is Negative.  Fact Sheet for Patients: SugarRoll.be  Fact Sheet for Healthcare Providers: https://www.woods-mathews.com/  This test is not yet approved or cleared by the [place] FDA and  has been authorized for detection and/or diagnosis of SARS-CoV-2 by FDA under an Emergency Use Authorization (EUA). This EUA will remain  in effect (meaning this test can be used) for the duration of the COVID-19 declaration under Se ction 564(b)(1) of the Act, 21 U.S.C. section 360bbb-3(b)(1), unless the authorization is terminated or revoked sooner.  Performed at [facility], [place] [address], [place], [place] [address]       Radiology Studies: No results found.     LOS: 7 days   Akira Adelsberger [employer] on [email or web address]  01/17/2021, 10:48 AM

## 2021-01-18 LAB — GLUCOSE, CAPILLARY
Glucose-Capillary: 215 mg/dL — ABNORMAL HIGH (ref 70–99)
Glucose-Capillary: 118 mg/dL — ABNORMAL HIGH (ref 70–99)

## 2021-01-18 MED ORDER — DARBEPOETIN ALFA 60 MCG/0.3ML IJ SOSY: 60.0000 ug | PREFILLED_SYRINGE | INTRAMUSCULAR | Status: DC

## 2021-01-18 MED ORDER — CHLORHEXIDINE GLUCONATE CLOTH 2 % EX PADS: 6.0000 | MEDICATED_PAD | Freq: Every day | CUTANEOUS | Status: DC

## 2021-01-18 NOTE — Progress Notes (Signed)
[facility] Progress Note   Subjective:   Pt tolerated HD yesterday however machine clotted with about 30 minutes remaining, will add heparin next treatment. Denies SOB, CP, palpitations, abdominal pain, and nausea.   Objective Vitals:   01/17/21 2350 01/18/21 0355 01/18/21 0617 01/18/21 0756  BP: (!) 115/42 134/79  132/68  Pulse: 100 100  92  Resp: '18 18  17  '$ Temp: 98.8 F (37.1 C) 98.5 F (36.9 C)  98.7 F (37.1 C)  TempSrc: Oral Oral  Oral  SpO2: 98% 100%  100%  Weight:   67.3 kg    Physical Exam General: Well developed, chronically ill appearing male,in NAD Heart: RRR, no murmurs, rubs or gallops Lungs: CTA anteriorly without wheezing, rhonchi or rales Abdomen: Soft, non-tender, non-distended, +BS Extremities: No edema b/l lower extremities Dialysis Access: LUE AVF + bruit Neuro:  R sided extremity weakness  Additional Objective Labs: Basic Metabolic Panel: Recent Labs  Lab 01/13/21 0047 01/14/21 0115 01/14/21 1253 01/17/21 1138  NA 138 137  --  137  K 3.8 4.0  --  4.3  CL 100 103  --  101  CO2 26 21*  --  19*  GLUCOSE 233* 271*  --  83  BUN 42* 59*  --  71*  CREATININE 7.21* 9.13*  --  10.61*  CALCIUM 8.4* 8.2* 7.7* 8.0*  PHOS  --   --  5.0*  --    CBC: Recent Labs  Lab 01/12/21 0201 01/14/21 1253 01/17/21 1138  WBC 8.3 8.1 8.9  NEUTROABS 6.1  --   --   HGB 9.9* 8.6* 8.1*  HCT 29.2* 26.3* 25.4*  MCV 90.4 94.3 96.2  PLT 161 140* 169   Blood Culture    Component Value Date/Time   SDES CSF 04/14/2020 1827   SPECREQUEST NONE 04/14/2020 1827   CULT  04/14/2020 1827    NO GROWTH 3 DAYS Performed at [place] [address] [place], [place] [address]    REPTSTATUS 04/18/2020 FINAL 04/14/2020 1827   CBG: Recent Labs  Lab 01/16/21 1631 01/16/21 2107 01/17/21 0625 01/17/21 2123 01/18/21 0610  GLUCAP 232* 129* 243* 104* 215*   Medications:  . sodium chloride   Intravenous Once  .  bictegravir-emtricitabine-tenofovir AF  1 tablet Oral Daily  . carvedilol  3.125 mg Oral BID WC  . Chlorhexidine Gluconate Cloth  6 each Topical Q0600  . Chlorhexidine Gluconate Cloth  6 each Topical Q0600  . cholecalciferol  2,000 Units Oral q AM  . clopidogrel  75 mg Oral Daily  . darbepoetin (ARANESP) injection - DIALYSIS  60 mcg Intravenous Q Fri-HD  . doxepin  75 mg Oral QHS  . heparin  5,000 Units Subcutaneous Q12H  . insulin aspart  0-9 Units Subcutaneous TID WC  . insulin glargine  5 Units Subcutaneous Daily  . levETIRAcetam  500 mg Oral BID  . loratadine  10 mg Oral Daily  . multivitamin  1 tablet Oral QHS  . pantoprazole  40 mg Oral Daily  . pravastatin  80 mg Oral QHS  . sulfamethoxazole-trimethoprim  1 tablet Oral Once per day on Mon Wed Fri  . tamsulosin  0.4 mg Oral Daily     Outpatient Dialysis Orders: [facility] 4h 400/800 2/2 bath 75kg Hep 2000 LUA AVF/ TDC  Was receiving hectorol 40mg q HD and mircera 518m q 2 weeks  Assessment/Plan:  1. Noncompliance with dialysis:Pt started HD in Aug 2021. His last HD prior to admit here  was Oct 02 2020. He was poorly compliant prior to this, often shortening/missing treatments. He felt he did not need dialysis. Presented here w/ BUN 214, Cr 24. We explained that his labs were life threatening. Pt agreed to restart HD. Has had serial HD here x 3 w/ improved azotemia. Vol is okay. Ptintermittentlymildly confused vs communication issue d/t aphasia. Used AVF x 3 w/o incident, IRwasremoved [facility].Pt has been accepted at [facility] on TTS schedule, but was refusing SNF so d/c cancelled. Palliative care consult pending.  2. Chronic debility: w/ R hemiparesis and aphasia sp CVA. WC bound and SNF dependent.  3. Rectal abscess: Chief complaint on presentation to the ED. Management per primary team. 4. Hypertension/volume:Volume controlled, still makes urine. BP's good, on home coreg.  5. Anemia:Hgbdropped to6.6, s/p  PRBC, now 8.1. Was on ESA prior to stopping dialysis. Tsat 32%. Got 60 ug aranesp here on 4/22, will continue weekly.  6. Metabolic bone disease:Phos controlled without binder, PTH 106- stopped VDRA. Calcium slightly low, use higher Ca bath.  7. Nutrition:Albumin 3.2. Recommend renal diet, fluid restrictions and protein supplementation.   Anice Paganini, PA-C 01/18/2021, 10:08 AM  [facility] Pager: [phone]

## 2021-01-18 NOTE — Progress Notes (Signed)
Pt transported to [facility] via [facility].

## 2021-01-18 NOTE — Progress Notes (Signed)
Palliative-   Received notice from Terri Piedra, LCSW- Renal Navigator that patient has agreed to discharge to SNF and will receive Palliative at SNF. Ok to hold on consult for now.   Please feel free to reconsult if needed.   Mariana Kaufman, AGNP-C Palliative Medicine

## 2021-01-18 NOTE — TOC Transition Note (Signed)
Transition of Care [facility]) - CM/SW Discharge Note   Patient Details  Name: Richard [name] MRN: [id number] Date of Birth: 1955-12-10  Transition of Care [facility]) CM/SW Contact:  Vinie Sill, LCSW Phone Number: 01/18/2021, 11:10 AM  Patient will Discharge to: [facility]  Discharge Date:01/18/2021 Family Notified: [name],daughter Transport [id number]  Per MD patient is ready for discharge. RN, patient, and facility notified of discharge. Discharge Summary sent to facility. RN given number for report [phone]. Ambulance transport requested for patient.   Clinical Social Worker signing off.  Thurmond Butts, MSW, LCSW Clinical Social Worker    Clinical Narrative:       Final next level of care: Skilled Nursing Facility Barriers to Discharge: Barriers Resolved   Patient Goals and CMS Choice        Discharge Placement              Patient chooses bed at:  [facility]) Patient to be transferred to facility by: [place] Name of family member notified: [name],daughter Patient and family notified of of transfer: 01/18/21  Discharge Plan and Services In-house Referral: Clinical Social Work                                   Social Determinants of Health (SDOH) Interventions     Readmission Risk Interventions Readmission Risk Prevention Plan 02/15/2020  Transportation Screening Complete  PCP or Specialist Appt within 5-7 Days Not Complete  Not Complete comments pending disposition  Home Care Screening Complete  Medication Review (RN CM) Referral to Pharmacy  Some recent data might be hidden

## 2021-01-18 NOTE — Progress Notes (Signed)
Renal Navigator appreciates update from [facility] CSW/C. Wynetta Emery that patient is now agreeable to return to [place]. He can return to HD at [place] since he is returning to SNF. Navigator, CSW and Attending discussed referral for outpatient palliative follow up with hopes of getting patient discharged to SNF today (CSW checking on SNF's ability to admit patient today), and therefore, will not hold patient here for inpatient palliative care consult-PMT attempted to see patient yesterday, but he was in HD. Navigator discussed this plan with K. Mahan/PMT who is in agreement. She will keep patient on her list just in case he changes his mind again and does not discharge today. Navigator appreciates team collaboration in caring for patient.   Alphonzo Cruise,  Renal Navigator [phone]

## 2021-01-18 NOTE — Progress Notes (Signed)
Pt refused all p.o. morning meds today.  MD notified and aware.

## 2021-01-18 NOTE — Progress Notes (Signed)
Physical Therapy Treatment Patient Details Name: Richard [name] MRN: [id number] DOB: 1956-03-01 Today's Date: 01/18/2021    History of Present Illness Richard [name] is a 65 y.o. male presenting 4/21 with worsening rectal pain for 4+ weeks. Patient claimed that he stopped HD 2+ months ago for "feeling tired of it, and I do not think I need dialysis any more, I made good urine", he further denied any shortness of breath cough or chest pain.  Since 1 month go he has had frequent episodes of rectal pain, "aching a lot" denies any abdominal pain, denies any blood in stool.  Denies any weight loss.  Patient denied feeling depressed and denied suicidal idea.  PMHx:  stroke with R sided weakness, CKD stage V with recently starting HD via a right chest tunneled catheter, IDDM, HTN, HLD, HIV on HAART, seizure disorde    PT Comments    Pt received in supine, agreeable to limited A/AAROM UE/LE exercises with encouragement and able to reposition in bed with modA and bed in trendelenburg to pull himself toward [facility]. Discussed pressure offloading techniques/frequency and encouraged him to perform exercises t/o day and assist with repositioning/care for strengthening. Pt continues to benefit from PT services to progress toward functional mobility goals. Continue to recommend SNF.  Follow Up Recommendations  SNF;Other (comment) (Trial of SNF to see if pt can improve burden of care.)     Equipment Recommendations  None recommended by PT    Recommendations for Other Services       Precautions / Restrictions Precautions Precautions: Fall Precaution Comments: R UE hemiparesis, contracted from prior CVA Restrictions Weight Bearing Restrictions: No    Mobility  Bed Mobility Overal bed mobility: Needs Assistance             General bed mobility comments: refusing EOB, able to roll with minA to reposition to R side and with bed in trendelenburg able to posterior supine scoot with +1 modA     Transfers                 General transfer comment: UTA, pt refusing  Ambulation/Gait             General Gait Details: not able   Stairs             Wheelchair Mobility    Modified Rankin (Stroke Patients Only)       Balance       Sitting balance - Comments: pt refusing, maybe try long sit in bed next session if he refuses EOB?                                    Cognition Arousal/Alertness: Awake/alert Behavior During Therapy: Flat affect Overall Cognitive Status: No family/caregiver present to determine baseline cognitive functioning                                 General Comments: Pt A & O x 3, following commands but reluctant to participate      Exercises Other Exercises Other Exercises: reclined with HOB up in bed LUE AROM: gross grasp/finger extension, wrist flex/ext, shoulder and elbow flex/ext x10 reps ea Other Exercises: RUE PROM: (pt self-ranging) R shoulder flexion within pain-free range x10 reps, then staff PROM elbow, wrist and finger flex/ext x10 reps Other Exercises: BLE AROM: ankle pumps (limited ROM on  R), knee flex/ext with bed in chair position, AAROM R hip flexion/abduction x10 reps ea    General Comments General comments (skin integrity, edema, etc.): VSS on RA, per RN he refused AM meds      Pertinent Vitals/Pain Pain Assessment: Faces Faces Pain Scale: Hurts a little bit Pain Location: generalized, R shoulder with PROM flexion >70 degrees Pain Descriptors / Indicators: Discomfort;Grimacing Pain Intervention(s): Limited activity within patient's tolerance;Monitored during session;Repositioned    Home Living                      Prior Function            PT Goals (current goals can now be found in the care plan section) Acute Rehab PT Goals Patient Stated Goal: to get better PT Goal Formulation: With patient Time For Goal Achievement: 01/29/21 Potential to Achieve Goals:  Fair Progress towards PT goals: Progressing toward goals (limited this date)    Frequency    Min 2X/week      PT Plan Current plan remains appropriate       AM-PAC PT "6 Clicks" Mobility   Outcome Measure  Help needed turning from your back to your side while in a flat bed without using bedrails?: A Lot Help needed moving from lying on your back to sitting on the side of a flat bed without using bedrails?: A Lot Help needed moving to and from a bed to a chair (including a wheelchair)?: Total Help needed standing up from a chair using your arms (e.g., wheelchair or bedside chair)?: Total Help needed to walk in hospital room?: Total Help needed climbing 3-5 steps with a railing? : Total 6 Click Score: 8    End of Session Equipment Utilized During Treatment:  (bed pads) Activity Tolerance: Patient limited by fatigue;Other (comment) (pt refusing EOB/OOB requesting lunch) Patient left: with call bell/phone within reach;in bed;with bed alarm set (heels floated) Nurse Communication: Mobility status PT Visit Diagnosis: Other abnormalities of gait and mobility (R26.89);Muscle weakness (generalized) (M62.81);Other symptoms and signs involving the nervous system (R29.898)     Time: [id number] PT Time Calculation (min) (ACUTE ONLY): 11 min  Charges:  $Therapeutic Exercise: 8-22 mins                     Richard [name] P., PTA Acute Rehabilitation Services Pager: [phone] Office: [place] 01/18/2021, 1:10 PM

## 2021-01-18 NOTE — Progress Notes (Signed)
01/18/2021 1420 - Spoke with pt's daughter [name] and let her know that Mr. Altamirano dentures were recovered from room and did not get included with pt's belongings in transport to [place].  She is at work but will try to get by at a later time to retrieve them.

## 2021-01-18 NOTE — Discharge Summary (Addendum)
[facility]  Physician Discharge Summary   Patient ID: Richard [name] MRN: [id number] DOB/AGE: December 21, 1955 65 y.o.  Admit date: 01/10/2021 Discharge date: 01/18/2021  PCP: [name], MD  DISCHARGE DIAGNOSES:  End-stage renal disease now on hemodialysis on TTS schedule Anemia of chronic kidney disease Rectal pain Diabetes mellitus type 2 Dyslipidemia History of HIV History of seizure disorder History of CVA Essential hypertension Moderate protein calorie malnutrition  RECOMMENDATIONS FOR OUTPATIENT FOLLOW UP: 1. Patient needs to keep up with his dialysis schedule on TTS 2. Follow-up with PCP in 1 to 2 weeks 3. Palliative care to consult while he is at skilled nursing facility.    Home Health: Going to SNF Equipment/Devices: None  CODE STATUS: Full code  DISCHARGE CONDITION: fair  Diet recommendation: Modified carbohydrate  INITIAL HISTORY: Richard [name] was admitted to the hospital with the working diagnosis of hyperkalemia in the setting of ESRD (non compliant with hemodialysis)  65 year old male past medical history for chronic kidney disease on hemodialysis, type 2 diabetes mellitus, hypertension, dyslipidemia, HIV and seizure disorder who presented with rectal pain. Patient reported stopping hemodialysis about 2 months ago, apparently he was feeling well and making urine. Over the last 4 weeks he experienced rectal pain, which was persistent and worsening, that prompted him to come to the hospital. On his initial physical examination blood pressure 147/49, heart rate 74, respiratory rate 18,his lungs were clear to auscultation bilaterally, heart S1-S2, present, rhythmic, soft abdomen, no lower extremity edema.  Sodium 144, potassium 7.2, chloride 109, bicarb less than 7, glucose 63, BUN 214, creatinine 24.8, white count 11.1, hemoglobin 7.7, hematocrit 24.7, platelets 210. SARS COVID-19 negative.  Chest radiograph with cardiomegaly,  hilar vascular congestion bilaterally.  EKG 96 bpm, normal axis, QTC 510, sinus rhythm, Q-wave V1-V2, no significant ST segment or T wave changes, positive LVH.  Patient underwent emergent hemodialysis with correction of hyperkalemia.  Left upper extremity fistula mature and working well. His right IJ catheter was removed with good toleration    Consultants: Nephrology  Procedures:  Right tunnled HD catheter removal.     HOSPITAL COURSE:    Acute hyperkalemia in the setting of end-stage renal disease, noncompliant with hemodialysis Patient underwent emergent hemodialysis with good toleration. Hemodialysis access, left upper extremity fistula is mature and working appropriately. Temporary hemodialysis tunnel catheter was removed. Nephrology continues to follow.  He is on TTS schedule.  Outpatient dialysis has been established. After further discussions with patient he was agreeable to undergo dialysis.  He went for dialysis yesterday without any incident.  This morning he is agreeable to go to SNF.  Anemia of chronic renal disease.  His iron stores showed serum iron of 54, transferrin saturation 32, ferritin 849, TIBC 169. His lowest hemoglobin reached 6.6, he received 1 unit packed red blood cells with good toleration. Hemoglobin noted to be stable. Continue with darbepoetin on HD  Rectal pain Likely due to hemorrhoids, rectal abscess was ruled out. Patient received local steroids with good toleration.  Type 2 diabetes mellitus, uncontrolled hypoglycemia/hyperglycemia. Dyslipidemia HbA1c 5.6.  Currently on Lantus 5 units daily and SSI.  CBGs are reasonably well controlled.  Continue pravastatin.  HIV He will continue antiretroviral therapy along with PJP prophylaxis with Bactrim.  Seizure, history of CVA No active seizures during his hospitalization, continue Keppra. Patient on clopidogrel and statin for history of CVA.  Essential hypertension. Blood  pressure is reasonably well controlled.  Depression. No agitation, continue doxepin.  Moderate calorie protein malnutrition. He  will continue with nutritional supplements. Nutrition Problem: Moderate Malnutrition Etiology: chronic illness (ESRD on HD)  Signs/Symptoms: mild fat depletion,moderate fat depletion,moderate muscle depletion,mild muscle depletion  Interventions: MVI,Nepro shake  Patient is stable.  Agreeable to go to SNF today.  Okay for discharge.   PERTINENT LABS:  The results of significant diagnostics from this hospitalization (including imaging, microbiology, ancillary and laboratory) are listed below for reference.    Microbiology: Recent Results (from the past 240 hour(s))  Resp Panel by RT-PCR (Flu A&B, Covid) Nasopharyngeal Swab     Status: None   Collection Time: 01/10/21  1:56 PM   Specimen: Nasopharyngeal Swab; Nasopharyngeal(NP) swabs in vial transport medium  Result Value Ref Range Status   SARS Coronavirus 2 by RT PCR NEGATIVE NEGATIVE Final    Comment: (NOTE) SARS-CoV-2 target nucleic acids are NOT DETECTED.  The SARS-CoV-2 RNA is generally detectable in upper respiratory specimens during the acute phase of infection. The lowest concentration of SARS-CoV-2 viral copies this assay can detect is 138 copies/mL. A negative result does not preclude SARS-Cov-2 infection and should not be used as the sole basis for treatment or other patient management decisions. A negative result may occur with  improper specimen collection/handling, submission of specimen other than nasopharyngeal swab, presence of viral mutation(s) within the areas targeted by this assay, and inadequate number of viral copies(<138 copies/mL). A negative result must be combined with clinical observations, patient history, and epidemiological information. The expected result is Negative.  Fact Sheet for Patients:  EntrepreneurPulse.com.au  Fact Sheet for  Healthcare Providers:  IncredibleEmployment.be  This test is no t yet approved or cleared by the [place] FDA and  has been authorized for detection and/or diagnosis of SARS-CoV-2 by FDA under an Emergency Use Authorization (EUA). This EUA will remain  in effect (meaning this test can be used) for the duration of the COVID-19 declaration under Section 564(b)(1) of the Act, 21 U.S.C.section 360bbb-3(b)(1), unless the authorization is terminated  or revoked sooner.       Influenza A by PCR NEGATIVE NEGATIVE Final   Influenza B by PCR NEGATIVE NEGATIVE Final    Comment: (NOTE) The Xpert Xpress SARS-CoV-2/FLU/RSV plus assay is intended as an aid in the diagnosis of influenza from Nasopharyngeal swab specimens and should not be used as a sole basis for treatment. Nasal washings and aspirates are unacceptable for Xpert Xpress SARS-CoV-2/FLU/RSV testing.  Fact Sheet for Patients: EntrepreneurPulse.com.au  Fact Sheet for Healthcare Providers: IncredibleEmployment.be  This test is not yet approved or cleared by the [place] FDA and has been authorized for detection and/or diagnosis of SARS-CoV-2 by FDA under an Emergency Use Authorization (EUA). This EUA will remain in effect (meaning this test can be used) for the duration of the COVID-19 declaration under Section 564(b)(1) of the Act, 21 U.S.C. section 360bbb-3(b)(1), unless the authorization is terminated or revoked.  Performed at [facility], [place] [address], [place], [place] [address]   MRSA PCR Screening     Status: None   Collection Time: 01/10/21  8:34 PM   Specimen: Nasopharyngeal  Result Value Ref Range Status   MRSA by PCR NEGATIVE NEGATIVE Final    Comment:        The GeneXpert MRSA Assay (FDA approved for NASAL specimens only), is one component of a comprehensive MRSA colonization surveillance program. It is not intended to diagnose  MRSA infection nor to guide or monitor treatment for MRSA infections. Performed at [facility], [place]  [address] [place], [place] [address]   SARS CORONAVIRUS 2 (TAT 6-24 HRS) Nasopharyngeal Nasopharyngeal Swab     Status: None   Collection Time: 01/16/21  1:05 PM   Specimen: Nasopharyngeal Swab  Result Value Ref Range Status   SARS Coronavirus 2 NEGATIVE NEGATIVE Final    Comment: (NOTE) SARS-CoV-2 target nucleic acids are NOT DETECTED.  The SARS-CoV-2 RNA is generally detectable in upper and lower respiratory specimens during the acute phase of infection. Negative results do not preclude SARS-CoV-2 infection, do not rule out co-infections with other pathogens, and should not be used as the sole basis for treatment or other patient management decisions. Negative results must be combined with clinical observations, patient history, and epidemiological information. The expected result is Negative.  Fact Sheet for Patients: SugarRoll.be  Fact Sheet for Healthcare Providers: https://www.woods-mathews.com/  This test is not yet approved or cleared by the [place] FDA and  has been authorized for detection and/or diagnosis of SARS-CoV-2 by FDA under an Emergency Use Authorization (EUA). This EUA will remain  in effect (meaning this test can be used) for the duration of the COVID-19 declaration under Se ction 564(b)(1) of the Act, 21 U.S.C. section 360bbb-3(b)(1), unless the authorization is terminated or revoked sooner.  Performed at [facility], [place] [address] [place], [place] [address]      Labs:  COVID-19 Labs  No results for input(s): DDIMER, FERRITIN, LDH, CRP in the last 72 hours.  Lab Results  Component Value Date   SARSCOV2NAA NEGATIVE 01/16/2021   [place] NEGATIVE 01/10/2021   [place] NEGATIVE 06/01/2020   [place] NEGATIVE 05/07/2020      Basic Metabolic Panel: Recent Labs  Lab  01/12/21 0201 01/13/21 0047 01/14/21 0115 01/14/21 1253 01/17/21 1138  NA 137 138 137  --  137  K 3.8 3.8 4.0  --  4.3  CL 100 100 103  --  101  CO2 23 26 21*  --  19*  GLUCOSE 79 233* 271*  --  83  BUN 62* 42* 59*  --  71*  CREATININE 9.49* 7.21* 9.13*  --  10.61*  CALCIUM 8.1* 8.4* 8.2* 7.7* 8.0*  PHOS  --   --   --  5.0*  --    CBC: Recent Labs  Lab 01/11/21 1132 01/11/21 2052 01/12/21 0201 01/14/21 1253 01/17/21 1138  WBC  --   --  8.3 8.1 8.9  NEUTROABS  --   --  6.1  --   --   HGB 7.5* 8.9* 9.9* 8.6* 8.1*  HCT 21.9* 26.1* 29.2* 26.3* 25.4*  MCV  --   --  90.4 94.3 96.2  PLT  --   --  161 140* 169    CBG: Recent Labs  Lab 01/16/21 1631 01/16/21 2107 01/17/21 0625 01/17/21 2123 01/18/21 0610  GLUCAP 232* 129* 243* 104* 215*     IMAGING STUDIES DG Pelvis 1-2 Views  Result Date: 01/10/2021 CLINICAL DATA:  Pelvic pain. No reported injury. 06/18/2009. EXAM: PELVIS - 1-2 VIEW COMPARISON:  Abdomen and pelvis CT dated 08/09/2020. FINDINGS: Normal appearing pelvic bones. Unremarkable hips. Atheromatous arterial calcifications. IMPRESSION: Normal appearing hips. Electronically Signed   By: Claudie Revering M.D.   On: 01/10/2021 14:45   IR Removal Tun Cv Cath W/O FL  Result Date: 01/14/2021 INDICATION: Patient with history of end-stage renal disease now with working AV fistula, request for removal of tunneled right IJ hemodialysis catheter. EXAM: REMOVAL OF TUNNELED HEMODIALYSIS CATHETER MEDICATIONS: 5 mL 1% lidocaine COMPLICATIONS:  None immediate. PROCEDURE: Informed written consent was obtained from the patient following an explanation of the procedure, risks, benefits and alternatives to treatment. A time out was performed prior to the initiation of the procedure. Maximal barrier sterile technique was utilized including caps, mask, sterile gowns, sterile gloves, large sterile drape, hand hygiene, and Hibiclens. 1% lidocaine was injected under sterile conditions along the  subcutaneous tunnel. Utilizing a combination of blunt dissection and gentle traction, the catheter was removed intact. Hemostasis was obtained with manual compression. A dressing was placed. The patient tolerated the procedure well without immediate post procedural complication. IMPRESSION: Successful removal of tunneled right IJ dialysis catheter. Read by Candiss Norse, PA-C Electronically Signed   By: Miachel Roux M.D.   On: 01/14/2021 10:28   DG Chest Portable 1 View  Result Date: 01/10/2021 CLINICAL DATA:  Missed dialysis.  Rule out fluid overload EXAM: PORTABLE CHEST 1 VIEW COMPARISON:  08/08/2020 FINDINGS: Right jugular dialysis catheter tip at the cavoatrial junction unchanged. Cardiac enlargement. Normal vascularity. Negative for edema or effusion. Negative for pneumonia. IMPRESSION: No active disease. Electronically Signed   By: Franchot Gallo M.D.   On: 01/10/2021 13:46    DISCHARGE EXAMINATION: Progress note from earlier today   DISPOSITION: SNF  Discharge Instructions    Diet - low sodium heart healthy   Complete by: As directed    Discharge instructions   Complete by: As directed    Follow up with primary care in 7 to 10 days.   Increase activity slowly   Complete by: As directed         Allergies as of 01/18/2021      Reactions   Oxycodone Nausea And Vomiting      Medication List    STOP taking these medications   calcitRIOL 0.25 MCG capsule Commonly known as: ROCALTROL   Narcan 4 MG/0.1ML Liqd nasal spray kit Generic drug: naloxone   zolpidem 5 MG tablet Commonly known as: AMBIEN     TAKE these medications   Accu-Chek Aviva Plus test strip Generic drug: glucose blood 1 each by Other route See admin instructions. Use 1 test strip to test blood sugar two-three times daily What changed: Another medication with the same name was changed. Make sure you understand how and when to take each.   Accu-Chek Aviva Plus test strip Generic drug: glucose  blood USE 1 strip TO test blood sugar 2 TIMES DAILY TO 3 TIMES DAILY What changed: See the new instructions.   acetaminophen 325 MG tablet Commonly known as: TYLENOL Take 650 mg by mouth every 6 (six) hours as needed for moderate pain.   bictegravir-emtricitabine-tenofovir AF 50-200-25 MG Tabs tablet Commonly known as: BIKTARVY Take 1 tablet by mouth daily.   blood glucose meter kit and supplies Kit Dispense based on patient and insurance preference. Check blood sugar 4 times a day What changed:   how much to take  how to take this  when to take this   carvedilol 3.125 MG tablet Commonly known as: COREG Take 1 tablet (3.125 mg total) by mouth 2 (two) times daily with a meal.   cetirizine 10 MG tablet Commonly known as: ZYRTEC Take 10 mg by mouth daily.   clopidogrel 75 MG tablet Commonly known as: PLAVIX Take 1 tablet (75 mg total) by mouth daily.   Commode 3-In-1 Misc Use as directed   Transfer Bench Misc Use as directed   Darbepoetin Alfa 60 MCG/0.3ML Sosy injection Commonly known as: ARANESP Inject 0.3 mLs (  60 mcg total) into the vein every Saturday with hemodialysis. Start taking on: January 19, 2021   doxepin 75 MG capsule Commonly known as: SINEQUAN Take 75 mg by mouth at bedtime.   feeding supplement (NEPRO CARB STEADY) Liqd Take 237 mLs by mouth 3 (three) times daily with meals.   insulin aspart 100 UNIT/ML injection Commonly known as: novoLOG Inject 0-9 Units into the skin 3 (three) times daily with meals. For glucose 150 to 200 use 2 units, for 201 to 250 use 3 units, for 251 to 300 use 5 units, for 301 to 350 use 7 units for 351 units or above use 9 units.   insulin glargine 100 UNIT/ML injection Commonly known as: LANTUS Inject 0.05 mLs (5 Units total) into the skin at bedtime.   Insulin Pen Needle 31G X 8 MM Misc Commonly known as: Sure Comfort Pen Needles Use as directed twice daily with Novolog flex pen What changed:   how much to  take  how to take this  when to take this   levETIRAcetam 500 MG tablet Commonly known as: Keppra Take 1 tablet (500 mg total) by mouth 2 (two) times daily.   multivitamin with minerals Tabs tablet Take 1 tablet by mouth daily.   pantoprazole 40 MG tablet Commonly known as: PROTONIX TAKE 1 TABLET BY MOUTH EVERY DAY   pravastatin 80 MG tablet Commonly known as: PRAVACHOL Take 1 tablet (80 mg total) by mouth at bedtime.   sulfamethoxazole-trimethoprim 800-160 MG tablet Commonly known as: BACTRIM DS Take 1 tablet by mouth 3 (three) times a week.   tamsulosin 0.4 MG Caps capsule Commonly known as: FLOMAX TAKE 1 CAPSULE BY MOUTH EVERY DAY   triamcinolone cream 0.1 % Commonly known as: KENALOG Apply 1 application topically 2 (two) times daily.   TRUEplus Insulin Syringe 31G X 5/16" 1 ML Misc Generic drug: Insulin Syringe-Needle U-100 Use pen needle with insulin 2 times daily What changed: See the new instructions.   Vitamin D 50 MCG (2000 UT) tablet Take 1 tablet (2,000 Units total) by mouth in the morning.         Follow-up Information    [name], MD Follow up in 1 week(s).   Specialty: Family Medicine Contact information: [place] [address] [phone]               TOTAL DISCHARGE TIME: 97 mins  [place]  [facility] Pager on [email or web address]  01/18/2021, 10:21 AM

## 2021-01-18 NOTE — Progress Notes (Signed)
[facility] PROGRESS NOTE   Richard [name] [id number] DOB: 07-Nov-1955 DOA: 01/10/2021  PCP: [name], MD  Brief History/Interval Summary: Richard [name] was admitted to the hospital with the working diagnosis of hyperkalemia in the setting of ESRD (non compliant with hemodialysis)  65 year old male past medical history for chronic kidney disease on hemodialysis, type 2 diabetes mellitus, hypertension, dyslipidemia, HIV and seizure disorder who presented with rectal pain. Patient reported stopping hemodialysis about 2 months ago, apparently he was feeling well and making urine. Over the last 4 weeks he experienced rectal pain, which was persistent and worsening, that prompted him to come to the hospital. On his initial physical examination blood pressure 147/49, heart rate 74, respiratory rate 18,his lungs were clear to auscultation bilaterally, heart S1-S2, present, rhythmic, soft abdomen, no lower extremity edema.  Sodium 144, potassium 7.2, chloride 109, bicarb less than 7, glucose 63, BUN 214, creatinine 24.8, white count 11.1, hemoglobin 7.7, hematocrit 24.7, platelets 210. SARS COVID-19 negative.  Chest radiograph with cardiomegaly, hilar vascular congestion bilaterally.  EKG 96 bpm, normal axis, QTC 510, sinus rhythm, Q-wave V1-V2, no significant ST segment or T wave changes, positive LVH.  Patient underwent emergent hemodialysis with correction of hyperkalemia.  Left upper extremity fistula mature and working well. His right IJ catheter was removed with good toleration    Consultants: Nephrology  Procedures:  Right tunnled HD catheter removal.   Antibiotics: Anti-infectives (From admission, onward)   Start     Dose/Rate Route Frequency Ordered Stop   01/11/21 1200  bictegravir-emtricitabine-tenofovir AF (BIKTARVY) 50-200-25 MG per tablet 1 tablet        1 tablet Oral Daily 01/10/21 1616     01/11/21 0900  sulfamethoxazole-trimethoprim  (BACTRIM DS) 800-160 MG per tablet 1 tablet        1 tablet Oral Once per day on Mon Wed Fri 01/10/21 1616        Subjective/Interval History: Patient mentions that he had an uneventful hemodialysis session yesterday.  At least to me he mentions that he is agreeable to go to skilled nursing facility.  Waiting on palliative discussion with patient.       Assessment/Plan:   Acute hyperkalemia in the setting of end-stage renal disease, noncompliant with hemodialysis  Patient underwent emergent hemodialysis with good toleration. Hemodialysis access, left upper extremity fistula is mature and working appropriately. Temporary hemodialysis tunnel catheter was removed.  Nephrology continues to follow.  He is on TTS schedule.  Outpatient dialysis has been established but patient has been going back and forth.  Palliative care to see patient today.   Anemia of chronic renal disease.   His iron stores showed serum iron of 54, transferrin saturation 32, ferritin 849, TIBC 169. His lowest hemoglobin reached 6.6, he received 1 unit packed red blood cells with good toleration. Hemoglobin noted to be stable. Continue with darbepoetin on HD  Rectal pain Likely due to hemorrhoids, rectal abscess was ruled out. Patient received local steroids with good toleration.  Type 2 diabetes mellitus, uncontrolled hypoglycemia/hyperglycemia.  Dyslipidemia HbA1c 5.6.  Currently on Lantus 5 units daily and SSI.  CBGs are reasonably well controlled.  Continue pravastatin.  HIV He will continue antiretroviral therapy along with PJP prophylaxis with Bactrim.  Seizure, history of CVA No active seizures during his hospitalization, continue Keppra. Patient on clopidogrel and statin for history of CVA.  Essential hypertension.   Blood pressure is reasonably well controlled.  Depression.   No agitation, continue doxepin.  Moderate calorie  protein malnutrition.   He will continue with nutritional  supplements. Nutrition Problem: Moderate Malnutrition Etiology: chronic illness (ESRD on HD)  Signs/Symptoms: mild fat depletion,moderate fat depletion,moderate muscle depletion,mild muscle depletion  Interventions: MVI,Nepro shake    DVT Prophylaxis: Subcutaneous heparin Code Status: Full code Family Communication: No family at bedside Disposition Plan: Cleared for discharge to SNF but patient has been vacillating.  To be seen by palliative care today.  Status is: Inpatient  Remains inpatient appropriate because:Unsafe d/c plan   Dispo: The patient is from: Home              Anticipated d/c is to: SNF              Patient currently is medically stable to d/c.   Difficult to place patient No       Medications:  Scheduled: . sodium chloride   Intravenous Once  . bictegravir-emtricitabine-tenofovir AF  1 tablet Oral Daily  . carvedilol  3.125 mg Oral BID WC  . Chlorhexidine Gluconate Cloth  6 each Topical Q0600  . Chlorhexidine Gluconate Cloth  6 each Topical Q0600  . cholecalciferol  2,000 Units Oral q AM  . clopidogrel  75 mg Oral Daily  . darbepoetin (ARANESP) injection - DIALYSIS  60 mcg Intravenous Q Fri-HD  . doxepin  75 mg Oral QHS  . heparin  5,000 Units Subcutaneous Q12H  . insulin aspart  0-9 Units Subcutaneous TID WC  . insulin glargine  5 Units Subcutaneous Daily  . levETIRAcetam  500 mg Oral BID  . loratadine  10 mg Oral Daily  . multivitamin  1 tablet Oral QHS  . pantoprazole  40 mg Oral Daily  . pravastatin  80 mg Oral QHS  . sulfamethoxazole-trimethoprim  1 tablet Oral Once per day on Mon Wed Fri  . tamsulosin  0.4 mg Oral Daily   Continuous:  [id number] **OR** acetaminophen, lidocaine (PF), ondansetron **OR** ondansetron (ZOFRAN) IV, zolpidem   Objective:  Vital Signs  Vitals:   01/17/21 2350 01/18/21 0355 01/18/21 0617 01/18/21 0756  BP: (!) 115/42 134/79  132/68  Pulse: 100 100  92  Resp: '18 18  17  '$ Temp: 98.8 F (37.1 C) 98.5  F (36.9 C)  98.7 F (37.1 C)  TempSrc: Oral Oral  Oral  SpO2: 98% 100%  100%  Weight:   67.3 kg     Intake/Output Summary (Last 24 hours) at 01/18/2021 0945 Last data filed at 01/18/2021 0248 Gross per 24 hour  Intake 360 ml  Output 763 ml  Net -403 ml   Filed Weights   01/17/21 1200 01/17/21 1604 01/18/21 0617  Weight: 66.6 kg 66.6 kg 67.3 kg    General appearance: Awake alert.  In no distress Resp: Clear to auscultation bilaterally.  Normal effort Cardio: S1-S2 is normal regular.  No S3-S4.  No rubs murmurs or bruit GI: Abdomen is soft.  Nontender nondistended.  Bowel sounds are present normal.  No masses organomegaly Neurologic: Has right hemiparesis from previous stroke.    Lab Results:  Data Reviewed: I have personally reviewed following labs and imaging studies  CBC: Recent Labs  Lab 01/11/21 1132 01/11/21 2052 01/12/21 0201 01/14/21 1253 01/17/21 1138  WBC  --   --  8.3 8.1 8.9  NEUTROABS  --   --  6.1  --   --   HGB 7.5* 8.9* 9.9* 8.6* 8.1*  HCT 21.9* 26.1* 29.2* 26.3* 25.4*  MCV  --   --  90.4 94.3 96.2  PLT  --   --  161 140* [id number]    Basic Metabolic Panel: Recent Labs  Lab 01/12/21 0201 01/13/21 0047 01/14/21 0115 01/14/21 1253 01/17/21 1138  NA 137 138 137  --  137  K 3.8 3.8 4.0  --  4.3  CL 100 100 103  --  101  CO2 23 26 21*  --  19*  GLUCOSE 79 233* 271*  --  83  BUN 62* 42* 59*  --  71*  CREATININE 9.49* 7.21* 9.13*  --  10.61*  CALCIUM 8.1* 8.4* 8.2* 7.7* 8.0*  PHOS  --   --   --  5.0*  --     GFR: Estimated Creatinine Clearance: 6.8 mL/min (A) (by C-G formula based on SCr of 10.61 mg/dL (H)).   CBG: Recent Labs  Lab 01/16/21 1631 01/16/21 2107 01/17/21 0625 01/17/21 2123 01/18/21 0610  GLUCAP 232* 129* 243* 104* 215*      Recent Results (from the past 240 hour(s))  Resp Panel by RT-PCR (Flu A&B, Covid) Nasopharyngeal Swab     Status: None   Collection Time: 01/10/21  1:56 PM   Specimen: Nasopharyngeal Swab;  Nasopharyngeal(NP) swabs in vial transport medium  Result Value Ref Range Status   SARS Coronavirus 2 by RT PCR NEGATIVE NEGATIVE Final    Comment: (NOTE) SARS-CoV-2 target nucleic acids are NOT DETECTED.  The SARS-CoV-2 RNA is generally detectable in upper respiratory specimens during the acute phase of infection. The lowest concentration of SARS-CoV-2 viral copies this assay can detect is 138 copies/mL. A negative result does not preclude SARS-Cov-2 infection and should not be used as the sole basis for treatment or other patient management decisions. A negative result may occur with  improper specimen collection/handling, submission of specimen other than nasopharyngeal swab, presence of viral mutation(s) within the areas targeted by this assay, and inadequate number of viral copies(<138 copies/mL). A negative result must be combined with clinical observations, patient history, and epidemiological information. The expected result is Negative.  Fact Sheet for Patients:  EntrepreneurPulse.com.au  Fact Sheet for Healthcare Providers:  IncredibleEmployment.be  This test is no t yet approved or cleared by the [place] FDA and  has been authorized for detection and/or diagnosis of SARS-CoV-2 by FDA under an Emergency Use Authorization (EUA). This EUA will remain  in effect (meaning this test can be used) for the duration of the COVID-19 declaration under Section 564(b)(1) of the Act, 21 U.S.C.section 360bbb-3(b)(1), unless the authorization is terminated  or revoked sooner.       Influenza A by PCR NEGATIVE NEGATIVE Final   Influenza B by PCR NEGATIVE NEGATIVE Final    Comment: (NOTE) The Xpert Xpress SARS-CoV-2/FLU/RSV plus assay is intended as an aid in the diagnosis of influenza from Nasopharyngeal swab specimens and should not be used as a sole basis for treatment. Nasal washings and aspirates are unacceptable for Xpert Xpress  SARS-CoV-2/FLU/RSV testing.  Fact Sheet for Patients: EntrepreneurPulse.com.au  Fact Sheet for Healthcare Providers: IncredibleEmployment.be  This test is not yet approved or cleared by the [place] FDA and has been authorized for detection and/or diagnosis of SARS-CoV-2 by FDA under an Emergency Use Authorization (EUA). This EUA will remain in effect (meaning this test can be used) for the duration of the COVID-19 declaration under Section 564(b)(1) of the Act, 21 U.S.C. section 360bbb-3(b)(1), unless the authorization is terminated or revoked.  Performed at [facility], [place] [address] [place], [place] [address]   MRSA  PCR Screening     Status: None   Collection Time: 01/10/21  8:34 PM   Specimen: Nasopharyngeal  Result Value Ref Range Status   MRSA by PCR NEGATIVE NEGATIVE Final    Comment:        The GeneXpert MRSA Assay (FDA approved for NASAL specimens only), is one component of a comprehensive MRSA colonization surveillance program. It is not intended to diagnose MRSA infection nor to guide or monitor treatment for MRSA infections. Performed at [facility], [place] [address] [place], [place] [address]   SARS CORONAVIRUS 2 (TAT 6-24 HRS) Nasopharyngeal Nasopharyngeal Swab     Status: None   Collection Time: 01/16/21  1:05 PM   Specimen: Nasopharyngeal Swab  Result Value Ref Range Status   SARS Coronavirus 2 NEGATIVE NEGATIVE Final    Comment: (NOTE) SARS-CoV-2 target nucleic acids are NOT DETECTED.  The SARS-CoV-2 RNA is generally detectable in upper and lower respiratory specimens during the acute phase of infection. Negative results do not preclude SARS-CoV-2 infection, do not rule out co-infections with other pathogens, and should not be used as the sole basis for treatment or other patient management decisions. Negative results must be combined with clinical observations, patient history, and  epidemiological information. The expected result is Negative.  Fact Sheet for Patients: SugarRoll.be  Fact Sheet for Healthcare Providers: https://www.woods-mathews.com/  This test is not yet approved or cleared by the [place] FDA and  has been authorized for detection and/or diagnosis of SARS-CoV-2 by FDA under an Emergency Use Authorization (EUA). This EUA will remain  in effect (meaning this test can be used) for the duration of the COVID-19 declaration under Se ction 564(b)(1) of the Act, 21 U.S.C. section 360bbb-3(b)(1), unless the authorization is terminated or revoked sooner.  Performed at [facility], [place] [address] [place], [place] [address]       Radiology Studies: No results found.     LOS: 8 days   Dominique Calvey [employer] on [email or web address]  01/18/2021, 9:45 AM

## 2021-01-18 NOTE — Progress Notes (Signed)
Renal Navigator updated outpatient clinic/[facility] and MD Dr. Hollie Salk of plan for patient to return to [facility] and have outpatient Palliative Care follow up at SNF to provide continuity of care.   Alphonzo Cruise, [place] Renal Navigator [phone]

## 2021-01-18 NOTE — Progress Notes (Signed)
Report given to RN at [facility]

## 2021-01-18 NOTE — TOC Progression Note (Signed)
Transition of Care [facility]) - Progression Note    Patient Details  Name: Orvel Cutsforth MRN: [id number] Date of Birth: Nov 11, 1955  Transition [facility]) CM/SW [place], [place] Phone Number: 01/18/2021, 10:15 AM  Clinical Narrative:     CSW met with patient at bedside. Patient states he is agreeable to returning back to [place].  CSW updated MD &  Renal Navigator CSW updated CSW sent message to [facility] -waiting on response   Thurmond Butts, MSW, LCSW Clinical Social Worker   Expected Discharge Plan: Skilled Nursing Facility Barriers to Discharge: Barriers Resolved  Expected Discharge Plan and Services Expected Discharge Plan: [place] In-house Referral: Clinical Social Work     Living arrangements for the past 2 months: [place] Expected Discharge Date: 01/16/21                                     Social Determinants of Health (SDOH) Interventions    Readmission Risk Interventions Readmission Risk Prevention Plan 02/15/2020  Transportation Screening Complete  PCP or Specialist Appt within 5-7 Days Not Complete  Not Complete comments pending disposition  Home Care Screening Complete  Medication Review (RN CM) Referral to Pharmacy  Some recent data might be hidden

## 2021-01-19 DIAGNOSIS — N2581 Secondary hyperparathyroidism of renal origin: Secondary | ICD-10-CM | POA: Diagnosis not present

## 2021-01-19 DIAGNOSIS — Z992 Dependence on renal dialysis: Secondary | ICD-10-CM | POA: Diagnosis not present

## 2021-01-19 DIAGNOSIS — N186 End stage renal disease: Secondary | ICD-10-CM | POA: Diagnosis not present

## 2021-01-22 DIAGNOSIS — N2581 Secondary hyperparathyroidism of renal origin: Secondary | ICD-10-CM | POA: Diagnosis not present

## 2021-01-22 DIAGNOSIS — Z992 Dependence on renal dialysis: Secondary | ICD-10-CM | POA: Diagnosis not present

## 2021-01-22 DIAGNOSIS — N186 End stage renal disease: Secondary | ICD-10-CM | POA: Diagnosis not present

## 2021-01-24 DIAGNOSIS — Z992 Dependence on renal dialysis: Secondary | ICD-10-CM | POA: Diagnosis not present

## 2021-01-24 DIAGNOSIS — N186 End stage renal disease: Secondary | ICD-10-CM | POA: Diagnosis not present

## 2021-01-24 DIAGNOSIS — N2581 Secondary hyperparathyroidism of renal origin: Secondary | ICD-10-CM | POA: Diagnosis not present

## 2021-01-26 DIAGNOSIS — N186 End stage renal disease: Secondary | ICD-10-CM | POA: Diagnosis not present

## 2021-01-26 DIAGNOSIS — Z992 Dependence on renal dialysis: Secondary | ICD-10-CM | POA: Diagnosis not present

## 2021-01-26 DIAGNOSIS — N2581 Secondary hyperparathyroidism of renal origin: Secondary | ICD-10-CM | POA: Diagnosis not present

## 2021-01-28 DIAGNOSIS — E875 Hyperkalemia: Secondary | ICD-10-CM | POA: Diagnosis not present

## 2021-01-28 DIAGNOSIS — B20 Human immunodeficiency virus [HIV] disease: Secondary | ICD-10-CM | POA: Diagnosis not present

## 2021-01-28 DIAGNOSIS — N186 End stage renal disease: Secondary | ICD-10-CM | POA: Diagnosis not present

## 2021-01-28 DIAGNOSIS — K648 Other hemorrhoids: Secondary | ICD-10-CM | POA: Diagnosis not present

## 2021-01-29 DIAGNOSIS — N2581 Secondary hyperparathyroidism of renal origin: Secondary | ICD-10-CM | POA: Diagnosis not present

## 2021-01-29 DIAGNOSIS — Z992 Dependence on renal dialysis: Secondary | ICD-10-CM | POA: Diagnosis not present

## 2021-01-29 DIAGNOSIS — N186 End stage renal disease: Secondary | ICD-10-CM | POA: Diagnosis not present

## 2021-01-31 DIAGNOSIS — N2581 Secondary hyperparathyroidism of renal origin: Secondary | ICD-10-CM | POA: Diagnosis not present

## 2021-01-31 DIAGNOSIS — Z992 Dependence on renal dialysis: Secondary | ICD-10-CM | POA: Diagnosis not present

## 2021-01-31 DIAGNOSIS — N186 End stage renal disease: Secondary | ICD-10-CM | POA: Diagnosis not present

## 2021-02-02 DIAGNOSIS — Z992 Dependence on renal dialysis: Secondary | ICD-10-CM | POA: Diagnosis not present

## 2021-02-02 DIAGNOSIS — N2581 Secondary hyperparathyroidism of renal origin: Secondary | ICD-10-CM | POA: Diagnosis not present

## 2021-02-02 DIAGNOSIS — N186 End stage renal disease: Secondary | ICD-10-CM | POA: Diagnosis not present

## 2021-02-05 DIAGNOSIS — Z20828 Contact with and (suspected) exposure to other viral communicable diseases: Secondary | ICD-10-CM | POA: Diagnosis not present

## 2021-02-05 DIAGNOSIS — N186 End stage renal disease: Secondary | ICD-10-CM | POA: Diagnosis not present

## 2021-02-05 DIAGNOSIS — N2581 Secondary hyperparathyroidism of renal origin: Secondary | ICD-10-CM | POA: Diagnosis not present

## 2021-02-05 DIAGNOSIS — Z992 Dependence on renal dialysis: Secondary | ICD-10-CM | POA: Diagnosis not present

## 2021-02-08 DIAGNOSIS — N186 End stage renal disease: Secondary | ICD-10-CM | POA: Diagnosis not present

## 2021-02-08 DIAGNOSIS — E559 Vitamin D deficiency, unspecified: Secondary | ICD-10-CM | POA: Diagnosis not present

## 2021-02-08 DIAGNOSIS — E785 Hyperlipidemia, unspecified: Secondary | ICD-10-CM | POA: Diagnosis not present

## 2021-02-08 DIAGNOSIS — N400 Enlarged prostate without lower urinary tract symptoms: Secondary | ICD-10-CM | POA: Diagnosis not present

## 2021-02-08 DIAGNOSIS — I10 Essential (primary) hypertension: Secondary | ICD-10-CM | POA: Diagnosis not present

## 2021-02-08 DIAGNOSIS — E119 Type 2 diabetes mellitus without complications: Secondary | ICD-10-CM | POA: Diagnosis not present

## 2021-02-09 DIAGNOSIS — N2581 Secondary hyperparathyroidism of renal origin: Secondary | ICD-10-CM | POA: Diagnosis not present

## 2021-02-09 DIAGNOSIS — Z992 Dependence on renal dialysis: Secondary | ICD-10-CM | POA: Diagnosis not present

## 2021-02-09 DIAGNOSIS — N186 End stage renal disease: Secondary | ICD-10-CM | POA: Diagnosis not present

## 2021-02-12 DIAGNOSIS — Z992 Dependence on renal dialysis: Secondary | ICD-10-CM | POA: Diagnosis not present

## 2021-02-12 DIAGNOSIS — N186 End stage renal disease: Secondary | ICD-10-CM | POA: Diagnosis not present

## 2021-02-12 DIAGNOSIS — N2581 Secondary hyperparathyroidism of renal origin: Secondary | ICD-10-CM | POA: Diagnosis not present

## 2021-02-17 ENCOUNTER — Encounter (HOSPITAL_COMMUNITY): Payer: Self-pay | Admitting: Emergency Medicine

## 2021-02-17 ENCOUNTER — Inpatient Hospital Stay (HOSPITAL_COMMUNITY): Payer: Medicare Other

## 2021-02-17 ENCOUNTER — Emergency Department (HOSPITAL_COMMUNITY): Payer: Medicare Other

## 2021-02-17 ENCOUNTER — Other Ambulatory Visit: Payer: Self-pay

## 2021-02-17 ENCOUNTER — Inpatient Hospital Stay (HOSPITAL_COMMUNITY)
Admission: EM | Admit: 2021-02-17 | Discharge: 2021-02-21 | DRG: 291 | Disposition: A | Discharge status: Skilled Nursing Facility | Payer: Medicare Other | Source: Skilled Nursing Facility | Attending: Internal Medicine | Admitting: Internal Medicine

## 2021-02-17 ENCOUNTER — Encounter (HOSPITAL_COMMUNITY): Payer: Self-pay

## 2021-02-17 DIAGNOSIS — R778 Other specified abnormalities of plasma proteins: Secondary | ICD-10-CM | POA: Diagnosis not present

## 2021-02-17 DIAGNOSIS — R0602 Shortness of breath: Secondary | ICD-10-CM

## 2021-02-17 DIAGNOSIS — Z820 Family history of epilepsy and other diseases of the nervous system: Secondary | ICD-10-CM

## 2021-02-17 DIAGNOSIS — I69351 Hemiplegia and hemiparesis following cerebral infarction affecting right dominant side: Secondary | ICD-10-CM | POA: Diagnosis not present

## 2021-02-17 DIAGNOSIS — Z823 Family history of stroke: Secondary | ICD-10-CM | POA: Diagnosis not present

## 2021-02-17 DIAGNOSIS — Z7409 Other reduced mobility: Secondary | ICD-10-CM | POA: Diagnosis not present

## 2021-02-17 DIAGNOSIS — R7989 Other specified abnormal findings of blood chemistry: Secondary | ICD-10-CM | POA: Diagnosis present

## 2021-02-17 DIAGNOSIS — I120 Hypertensive chronic kidney disease with stage 5 chronic kidney disease or end stage renal disease: Secondary | ICD-10-CM | POA: Diagnosis not present

## 2021-02-17 DIAGNOSIS — Z885 Allergy status to narcotic agent status: Secondary | ICD-10-CM

## 2021-02-17 DIAGNOSIS — I5021 Acute systolic (congestive) heart failure: Secondary | ICD-10-CM | POA: Diagnosis not present

## 2021-02-17 DIAGNOSIS — E8770 Fluid overload, unspecified: Secondary | ICD-10-CM | POA: Diagnosis not present

## 2021-02-17 DIAGNOSIS — M1990 Unspecified osteoarthritis, unspecified site: Secondary | ICD-10-CM | POA: Diagnosis present

## 2021-02-17 DIAGNOSIS — E8779 Other fluid overload: Secondary | ICD-10-CM | POA: Diagnosis not present

## 2021-02-17 DIAGNOSIS — D631 Anemia in chronic kidney disease: Secondary | ICD-10-CM | POA: Diagnosis present

## 2021-02-17 DIAGNOSIS — K219 Gastro-esophageal reflux disease without esophagitis: Secondary | ICD-10-CM | POA: Diagnosis present

## 2021-02-17 DIAGNOSIS — Z20822 Contact with and (suspected) exposure to covid-19: Secondary | ICD-10-CM | POA: Diagnosis not present

## 2021-02-17 DIAGNOSIS — I160 Hypertensive urgency: Secondary | ICD-10-CM | POA: Diagnosis present

## 2021-02-17 DIAGNOSIS — E1165 Type 2 diabetes mellitus with hyperglycemia: Secondary | ICD-10-CM | POA: Diagnosis not present

## 2021-02-17 DIAGNOSIS — Z8249 Family history of ischemic heart disease and other diseases of the circulatory system: Secondary | ICD-10-CM

## 2021-02-17 DIAGNOSIS — B20 Human immunodeficiency virus [HIV] disease: Secondary | ICD-10-CM | POA: Diagnosis present

## 2021-02-17 DIAGNOSIS — I10 Essential (primary) hypertension: Secondary | ICD-10-CM | POA: Diagnosis not present

## 2021-02-17 DIAGNOSIS — R000 Tachycardia, unspecified: Secondary | ICD-10-CM | POA: Diagnosis not present

## 2021-02-17 DIAGNOSIS — J9601 Acute respiratory failure with hypoxia: Secondary | ICD-10-CM | POA: Diagnosis not present

## 2021-02-17 DIAGNOSIS — E1122 Type 2 diabetes mellitus with diabetic chronic kidney disease: Secondary | ICD-10-CM | POA: Diagnosis present

## 2021-02-17 DIAGNOSIS — I5043 Acute on chronic combined systolic (congestive) and diastolic (congestive) heart failure: Secondary | ICD-10-CM | POA: Diagnosis present

## 2021-02-17 DIAGNOSIS — F32A Depression, unspecified: Secondary | ICD-10-CM | POA: Diagnosis present

## 2021-02-17 DIAGNOSIS — R0689 Other abnormalities of breathing: Secondary | ICD-10-CM | POA: Diagnosis not present

## 2021-02-17 DIAGNOSIS — Z9115 Patient's noncompliance with renal dialysis: Secondary | ICD-10-CM | POA: Diagnosis not present

## 2021-02-17 DIAGNOSIS — I248 Other forms of acute ischemic heart disease: Secondary | ICD-10-CM | POA: Diagnosis present

## 2021-02-17 DIAGNOSIS — Z7902 Long term (current) use of antithrombotics/antiplatelets: Secondary | ICD-10-CM

## 2021-02-17 DIAGNOSIS — E1169 Type 2 diabetes mellitus with other specified complication: Secondary | ICD-10-CM

## 2021-02-17 DIAGNOSIS — N250 Renal osteodystrophy: Secondary | ICD-10-CM | POA: Diagnosis not present

## 2021-02-17 DIAGNOSIS — I5033 Acute on chronic diastolic (congestive) heart failure: Secondary | ICD-10-CM | POA: Diagnosis present

## 2021-02-17 DIAGNOSIS — Z833 Family history of diabetes mellitus: Secondary | ICD-10-CM

## 2021-02-17 DIAGNOSIS — F17210 Nicotine dependence, cigarettes, uncomplicated: Secondary | ICD-10-CM | POA: Diagnosis present

## 2021-02-17 DIAGNOSIS — Z794 Long term (current) use of insulin: Secondary | ICD-10-CM | POA: Diagnosis not present

## 2021-02-17 DIAGNOSIS — N400 Enlarged prostate without lower urinary tract symptoms: Secondary | ICD-10-CM | POA: Diagnosis present

## 2021-02-17 DIAGNOSIS — E119 Type 2 diabetes mellitus without complications: Secondary | ICD-10-CM

## 2021-02-17 DIAGNOSIS — Z803 Family history of malignant neoplasm of breast: Secondary | ICD-10-CM | POA: Diagnosis not present

## 2021-02-17 DIAGNOSIS — I132 Hypertensive heart and chronic kidney disease with heart failure and with stage 5 chronic kidney disease, or end stage renal disease: Principal | ICD-10-CM | POA: Diagnosis present

## 2021-02-17 DIAGNOSIS — R5381 Other malaise: Secondary | ICD-10-CM | POA: Diagnosis not present

## 2021-02-17 DIAGNOSIS — Z992 Dependence on renal dialysis: Secondary | ICD-10-CM | POA: Diagnosis not present

## 2021-02-17 DIAGNOSIS — N2581 Secondary hyperparathyroidism of renal origin: Secondary | ICD-10-CM | POA: Diagnosis present

## 2021-02-17 DIAGNOSIS — R531 Weakness: Secondary | ICD-10-CM | POA: Diagnosis not present

## 2021-02-17 DIAGNOSIS — E785 Hyperlipidemia, unspecified: Secondary | ICD-10-CM | POA: Diagnosis present

## 2021-02-17 DIAGNOSIS — G40909 Epilepsy, unspecified, not intractable, without status epilepticus: Secondary | ICD-10-CM | POA: Diagnosis present

## 2021-02-17 DIAGNOSIS — N186 End stage renal disease: Secondary | ICD-10-CM | POA: Diagnosis present

## 2021-02-17 DIAGNOSIS — Z79899 Other long term (current) drug therapy: Secondary | ICD-10-CM

## 2021-02-17 DIAGNOSIS — Z91158 Patient's noncompliance with renal dialysis for other reason: Secondary | ICD-10-CM

## 2021-02-17 LAB — CBC WITH DIFFERENTIAL/PLATELET
Abs Immature Granulocytes: 0.02 K/uL (ref 0.00–0.07)
Basophils Absolute: 0 K/uL (ref 0.0–0.1)
Basophils Relative: 0 %
Eosinophils Absolute: 0.3 K/uL (ref 0.0–0.5)
Eosinophils Relative: 4 %
HCT: 25.3 % — ABNORMAL LOW (ref 39.0–52.0)
Hemoglobin: 7.8 g/dL — ABNORMAL LOW (ref 13.0–17.0)
Immature Granulocytes: 0 %
Lymphocytes Relative: 17 %
Lymphs Abs: 1.4 K/uL (ref 0.7–4.0)
MCH: 31 pg (ref 26.0–34.0)
MCHC: 30.8 g/dL (ref 30.0–36.0)
MCV: 100.4 fL — ABNORMAL HIGH (ref 80.0–100.0)
Monocytes Absolute: 0.5 K/uL (ref 0.1–1.0)
Monocytes Relative: 6 %
Neutro Abs: 5.8 K/uL (ref 1.7–7.7)
Neutrophils Relative %: 73 %
Platelets: 145 K/uL — ABNORMAL LOW (ref 150–400)
RBC: 2.52 MIL/uL — ABNORMAL LOW (ref 4.22–5.81)
RDW: 15.8 % — ABNORMAL HIGH (ref 11.5–15.5)
WBC: 8 K/uL (ref 4.0–10.5)
nRBC: 0 % (ref 0.0–0.2)

## 2021-02-17 LAB — ECHOCARDIOGRAM COMPLETE
Area-P 1/2: 3.5 cm2
S' Lateral: 3.9 cm

## 2021-02-17 LAB — BASIC METABOLIC PANEL
Anion gap: 9 (ref 5–15)
BUN: 54 mg/dL — ABNORMAL HIGH (ref 8–23)
CO2: 22 mmol/L (ref 22–32)
Calcium: 8.2 mg/dL — ABNORMAL LOW (ref 8.9–10.3)
Chloride: 110 mmol/L (ref 98–111)
Creatinine, Ser: 10.16 mg/dL — ABNORMAL HIGH (ref 0.61–1.24)
GFR, Estimated: 5 mL/min — ABNORMAL LOW (ref >60)
Glucose, Bld: 138 mg/dL — ABNORMAL HIGH (ref 70–99)
Potassium: 4.9 mmol/L (ref 3.5–5.1)
Sodium: 141 mmol/L (ref 135–145)

## 2021-02-17 LAB — GLUCOSE, CAPILLARY
Glucose-Capillary: 78 mg/dL (ref 70–99)
Glucose-Capillary: 149 mg/dL — ABNORMAL HIGH (ref 70–99)
Glucose-Capillary: 114 mg/dL — ABNORMAL HIGH (ref 70–99)

## 2021-02-17 LAB — CREATININE, SERUM
Creatinine, Ser: 10.37 mg/dL — ABNORMAL HIGH (ref 0.61–1.24)
GFR, Estimated: 5 mL/min — ABNORMAL LOW (ref >60)

## 2021-02-17 LAB — TROPONIN I (HIGH SENSITIVITY)
Troponin I (High Sensitivity): 162 ng/L (ref <18)
Troponin I (High Sensitivity): 208 ng/L (ref <18)
Troponin I (High Sensitivity): 213 ng/L (ref <18)

## 2021-02-17 LAB — BRAIN NATRIURETIC PEPTIDE: B Natriuretic Peptide: 983.1 pg/mL — ABNORMAL HIGH (ref 0.0–100.0)

## 2021-02-17 LAB — RESP PANEL BY RT-PCR (FLU A&B, COVID) ARPGX2
Influenza A by PCR: NEGATIVE
Influenza B by PCR: NEGATIVE
SARS Coronavirus 2 by RT PCR: NEGATIVE

## 2021-02-17 MED ORDER — ONDANSETRON HCL 4 MG/2ML IJ SOLN: 4.0000 mg | Freq: Four times a day (QID) | INTRAMUSCULAR | Status: DC | PRN

## 2021-02-17 MED ORDER — ALTEPLASE 2 MG IJ SOLR
2.0000 mg | Freq: Once | INTRAMUSCULAR | Status: DC | PRN
  Filled 2021-02-17: qty 2

## 2021-02-17 MED ORDER — INSULIN ASPART 100 UNIT/ML IJ SOLN: 0.0000 [IU] | Freq: Every day | INTRAMUSCULAR | Status: DC

## 2021-02-17 MED ORDER — CHLORHEXIDINE GLUCONATE CLOTH 2 % EX PADS
6.0000 | MEDICATED_PAD | Freq: Every day | CUTANEOUS | Status: DC
  Administered 2021-02-17: 6 via TOPICAL

## 2021-02-17 MED ORDER — SODIUM CHLORIDE 0.9 % IV SOLN: 100.0000 mL | INTRAVENOUS | Status: DC | PRN

## 2021-02-17 MED ORDER — DARBEPOETIN ALFA 100 MCG/0.5ML IJ SOSY: 100.0000 ug | PREFILLED_SYRINGE | INTRAMUSCULAR | Status: DC

## 2021-02-17 MED ORDER — LIDOCAINE HCL (PF) 1 % IJ SOLN: 5.0000 mL | INTRAMUSCULAR | Status: DC | PRN

## 2021-02-17 MED ORDER — NITROGLYCERIN 0.4 MG SL SUBL
0.4000 mg | SUBLINGUAL_TABLET | SUBLINGUAL | Status: DC | PRN
  Administered 2021-02-17 (×2): 0.4 mg via SUBLINGUAL
  Filled 2021-02-17: qty 1

## 2021-02-17 MED ORDER — DARBEPOETIN ALFA 100 MCG/0.5ML IJ SOSY
100.0000 ug | PREFILLED_SYRINGE | INTRAMUSCULAR | Status: AC
  Administered 2021-02-17: 100 ug via INTRAVENOUS
  Filled 2021-02-17: qty 0.5

## 2021-02-17 MED ORDER — CLONIDINE HCL 0.2 MG/24HR TD PTWK
0.2000 mg | MEDICATED_PATCH | TRANSDERMAL | Status: DC
  Administered 2021-02-17: 0.2 mg via TRANSDERMAL
  Filled 2021-02-17: qty 1

## 2021-02-17 MED ORDER — HYDRALAZINE HCL 20 MG/ML IJ SOLN
10.0000 mg | Freq: Three times a day (TID) | INTRAMUSCULAR | Status: DC | PRN
  Administered 2021-02-17: 10 mg via INTRAVENOUS
  Filled 2021-02-17: qty 1

## 2021-02-17 MED ORDER — INSULIN ASPART 100 UNIT/ML IJ SOLN
0.0000 [IU] | Freq: Three times a day (TID) | INTRAMUSCULAR | Status: DC
  Administered 2021-02-18 – 2021-02-21 (×3): 1 [IU] via SUBCUTANEOUS

## 2021-02-17 MED ORDER — DOXERCALCIFEROL 4 MCG/2ML IV SOLN
2.0000 ug | INTRAVENOUS | Status: DC
  Filled 2021-02-17 (×2): qty 2

## 2021-02-17 MED ORDER — LABETALOL HCL 100 MG PO TABS
100.0000 mg | ORAL_TABLET | Freq: Once | ORAL | Status: AC
  Administered 2021-02-17: 100 mg via ORAL
  Filled 2021-02-17: qty 1

## 2021-02-17 MED ORDER — LABETALOL HCL 5 MG/ML IV SOLN
10.0000 mg | Freq: Once | INTRAVENOUS | Status: DC
  Filled 2021-02-17: qty 4

## 2021-02-17 MED ORDER — DARBEPOETIN ALFA 100 MCG/0.5ML IJ SOSY
PREFILLED_SYRINGE | INTRAMUSCULAR | Status: AC
  Filled 2021-02-17: qty 0.5

## 2021-02-17 MED ORDER — LIDOCAINE-PRILOCAINE 2.5-2.5 % EX CREA
1.0000 "application " | TOPICAL_CREAM | CUTANEOUS | Status: DC | PRN
  Filled 2021-02-17: qty 5

## 2021-02-17 MED ORDER — CLONIDINE HCL 0.2 MG PO TABS
0.2000 mg | ORAL_TABLET | Freq: Three times a day (TID) | ORAL | Status: DC | PRN
  Administered 2021-02-17: 0.2 mg via ORAL
  Filled 2021-02-17: qty 1

## 2021-02-17 MED ORDER — HEPARIN SODIUM (PORCINE) 5000 UNIT/ML IJ SOLN
5000.0000 [IU] | Freq: Three times a day (TID) | INTRAMUSCULAR | Status: DC
  Administered 2021-02-17 – 2021-02-21 (×12): 5000 [IU] via SUBCUTANEOUS
  Filled 2021-02-17 (×12): qty 1

## 2021-02-17 MED ORDER — ONDANSETRON HCL 4 MG PO TABS: 4.0000 mg | ORAL_TABLET | Freq: Four times a day (QID) | ORAL | Status: DC | PRN

## 2021-02-17 MED ORDER — HEPARIN SODIUM (PORCINE) 1000 UNIT/ML DIALYSIS
1200.0000 [IU] | Freq: Once | INTRAMUSCULAR | Status: DC
  Filled 2021-02-17: qty 2

## 2021-02-17 MED ORDER — HEPARIN SODIUM (PORCINE) 1000 UNIT/ML DIALYSIS
1000.0000 [IU] | INTRAMUSCULAR | Status: DC | PRN
  Filled 2021-02-17: qty 1

## 2021-02-17 MED ORDER — PENTAFLUOROPROP-TETRAFLUOROETH EX AERO: 1.0000 "application " | INHALATION_SPRAY | CUTANEOUS | Status: DC | PRN

## 2021-02-17 MED ORDER — ACETAMINOPHEN 650 MG RE SUPP: 650.0000 mg | Freq: Four times a day (QID) | RECTAL | Status: DC | PRN

## 2021-02-17 MED ORDER — CARVEDILOL 3.125 MG PO TABS
3.1250 mg | ORAL_TABLET | Freq: Once | ORAL | Status: AC
  Administered 2021-02-17: 3.125 mg via ORAL
  Filled 2021-02-17: qty 1

## 2021-02-17 MED ORDER — ACETAMINOPHEN 325 MG PO TABS: 650.0000 mg | ORAL_TABLET | Freq: Four times a day (QID) | ORAL | Status: DC | PRN

## 2021-02-17 NOTE — Progress Notes (Signed)
*  PRELIMINARY RESULTS* Echocardiogram 2D Echocardiogram has been performed.  Richard [name] 02/17/2021, 2:44 PM

## 2021-02-17 NOTE — Consult Note (Addendum)
[facility] Renal Consultation Note  Indication for Consultation:  Management of ESRD/hemodialysis; anemia, hypertension/volume and secondary hyperparathyroidism  HPI: Richard [name] is a 65 y.o. male with ESRD, ( NONCOMPLIANT WITH HD TXS) started HD 04/2020. Living at [facility], DM, Hx CVA, Hx seizures - [facility] admit 01/2021 with uremia, BUN > 200 after missing HD since 09/2020. Back @ [facility] with caveat that he comes to op hd , living in SNF again.   Patient admitted volume overload shortness of breath, chest x-ray interstitial edema, EKG no acute changes, noted he missed his last 2 HD treatments kidney center 5/28 5/26 . K4.9, Hgb 7.8 WBC 8.4  On admission blood pressure elevated 214/106 in the ER now down to 173/87 with labetalol and Coreg given.  Seen in Room eating Lunch on room air  No sob currently , no cp or abd pain or, weakness. Initially  Reports  did not miss op hd and then states  [facility] did not take him to HD yesterday or Thursday .Can not tell me why signs off early all txs this month.   Past Medical History:  Diagnosis Date  . Arthritis   . Diabetes mellitus, type II, insulin dependent ([place])   . GERD (gastroesophageal reflux disease)   . Hypertension   . Hypertensive emergency 05/21/2012  . Immune deficiency disorder ([place])   . Migraines   . Neuropathy in diabetes ([place])    bilat feet  . Ruptured lumbar disc   . Stroke [facility]) 2013   residual right sided weakness (arm>leg)    Past Surgical History:  Procedure Laterality Date  . AV FISTULA PLACEMENT Left 04/25/2020   Procedure: LEFT ARM BRACHIOCEPHALIC ARTERIOVENOUS (AV) FISTULA CREATION;  Surgeon: Rosetta Posner, MD;  Location: [place];  Service: Vascular;  Laterality: Left;  . IR FLUORO GUIDE CV LINE RIGHT  04/13/2020  . IR REMOVAL TUN CV CATH W/O FL  01/14/2021  . IR US GUIDE VASC ACCESS RIGHT  04/13/2020  . KNEE ARTHROSCOPY Bilateral       Family History  Problem Relation Age of Onset  . Other  Mother   . Stroke Father   . Hypertension Father   . Epilepsy Father   . Breast cancer Sister   . Stroke Brother   . Heart attack Brother   . Diabetes Sister       reports that he has been smoking cigarettes. He has been smoking about 0.50 packs per day. He has never used smokeless tobacco. He reports current drug use. Drug: Marijuana. He reports that he does not drink alcohol.   Allergies  Allergen Reactions  . Oxycodone Nausea And Vomiting    Prior to Admission medications   Medication Sig Start Date End Date Taking? Authorizing Provider  ACCU-CHEK AVIVA PLUS test strip 1 each by Other route See admin instructions. Use 1 test strip to test blood sugar two-three times daily 04/20/18   [provider]  ACCU-CHEK AVIVA PLUS test strip USE 1 strip TO test blood sugar 2 TIMES DAILY TO 3 TIMES DAILY Patient taking differently: 1 each by Other route in the morning, at noon, and at bedtime. 06/10/19   [name], MD  acetaminophen (TYLENOL) 325 MG tablet Take 650 mg by mouth every 6 (six) hours as needed for moderate pain.    [provider]  bictegravir-emtricitabine-tenofovir AF (BIKTARVY) 50-200-25 MG TABS tablet Take 1 tablet by mouth daily. 05/01/20   Oswald Hillock, MD  blood glucose meter  kit and supplies KIT Dispense based on patient and insurance preference. Check blood sugar 4 times a day Patient taking differently: Inject 1 each into the skin See admin instructions. Dispense based on patient and insurance preference. Check blood sugar 4 times a day 06/11/18   [name], MD  carvedilol (COREG) 3.125 MG tablet Take 1 tablet (3.125 mg total) by mouth 2 (two) times daily with a meal. 05/07/20 06/06/20  Arrien, Jimmy Picket, MD  cetirizine (ZYRTEC) 10 MG tablet Take 10 mg by mouth daily.    [provider]  Cholecalciferol (VITAMIN D) 50 MCG (2000 UT) tablet Take 1 tablet (2,000 Units total) by mouth in the morning. 03/07/20   [name],  MD  clopidogrel (PLAVIX) 75 MG tablet Take 1 tablet (75 mg total) by mouth daily. 03/07/20   [name], MD  Darbepoetin Alfa (ARANESP) 60 MCG/0.3ML SOSY injection Inject 0.3 mLs (60 mcg total) into the vein every Saturday with hemodialysis. 01/19/21   Bonnielee Haff, MD  doxepin (SINEQUAN) 75 MG capsule Take 75 mg by mouth at bedtime.    [provider]  insulin aspart (NOVOLOG) 100 UNIT/ML injection Inject 0-9 Units into the skin 3 (three) times daily with meals. For glucose 150 to 200 use 2 units, for 201 to 250 use 3 units, for 251 to 300 use 5 units, for 301 to 350 use 7 units for 351 units or above use 9 units. 05/07/20 06/06/20  Arrien, Jimmy Picket, MD  insulin glargine (LANTUS) 100 UNIT/ML injection Inject 0.05 mLs (5 Units total) into the skin at bedtime. 05/07/20 06/06/20  Arrien, Jimmy Picket, MD  Insulin Pen Needle (SURE COMFORT PEN NEEDLES) 31G X 8 MM MISC Use as directed twice daily with Novolog flex pen Patient taking differently: 1 each by Other route See admin instructions. Use as directed twice daily with Novolog flex pen 02/10/19   Koberlein, [name], MD  levETIRAcetam (KEPPRA) 500 MG tablet Take 1 tablet (500 mg total) by mouth 2 (two) times daily. 03/07/20 06/01/20  [name], MD  Misc. Devices (COMMODE 3-IN-1) MISC Use as directed 02/24/20   [name], MD  Misc. Devices (TRANSFER BENCH) MISC Use as directed 02/24/20   [name], MD  Multiple Vitamin (MULTIVITAMIN WITH MINERALS) TABS tablet Take 1 tablet by mouth daily.    [provider]  Nutritional Supplements (FEEDING SUPPLEMENT, NEPRO CARB STEADY,) LIQD Take 237 mLs by mouth 3 (three) times daily with meals.    [provider]  pantoprazole (PROTONIX) 40 MG tablet TAKE 1 TABLET BY MOUTH EVERY DAY Patient taking differently: Take 40 mg by mouth daily. 01/23/20   [name], MD  pravastatin (PRAVACHOL) 80 MG tablet Take 1 tablet (80 mg total) by mouth at  bedtime. 03/07/20   [name], MD  sulfamethoxazole-trimethoprim (BACTRIM DS) 800-160 MG tablet Take 1 tablet by mouth 3 (three) times a week. 04/23/20   Little Ishikawa, MD  tamsulosin (FLOMAX) 0.4 MG CAPS capsule TAKE 1 CAPSULE BY MOUTH EVERY DAY Patient taking differently: Take 0.4 mg by mouth daily. 03/28/20   [name], MD  triamcinolone cream (KENALOG) 0.1 % Apply 1 application topically 2 (two) times daily.    [provider]  TRUEPLUS INSULIN SYRINGE 31G X 5/16" 1 ML MISC Use pen needle with insulin 2 times daily Patient taking differently: 1 each by Other route in the morning and at bedtime. With insulin 03/31/19   Koberlein, Junell C,  MD        Results for orders placed or performed during the hospital encounter of 02/17/21 (from the past 48 hour(s))  CBC with Differential     Status: Abnormal   Collection Time: 02/17/21  5:23 AM  Result Value Ref Range   WBC 8.0 4.0 - 10.5 K/uL   RBC 2.52 (L) 4.22 - 5.81 MIL/uL   Hemoglobin 7.8 (L) 13.0 - 17.0 g/dL   HCT 25.3 (L) 39.0 - 52.0 %   MCV 100.4 (H) 80.0 - 100.0 fL   MCH 31.0 26.0 - 34.0 pg   MCHC 30.8 30.0 - 36.0 g/dL   RDW 15.8 (H) 11.5 - 15.5 %   Platelets 145 (L) 150 - 400 K/uL   nRBC 0.0 0.0 - 0.2 %   Neutrophils Relative % 73 %   Neutro Abs 5.8 1.7 - 7.7 K/uL   Lymphocytes Relative 17 %   Lymphs Abs 1.4 0.7 - 4.0 K/uL   Monocytes Relative 6 %   Monocytes Absolute 0.5 0.1 - 1.0 K/uL   Eosinophils Relative 4 %   Eosinophils Absolute 0.3 0.0 - 0.5 K/uL   Basophils Relative 0 %   Basophils Absolute 0.0 0.0 - 0.1 K/uL   Immature Granulocytes 0 %   Abs Immature Granulocytes 0.02 0.00 - 0.07 K/uL    Comment: Performed at [facility], [place] [address], [place], [place] [address]  Basic metabolic panel     Status: Abnormal   Collection Time: 02/17/21  5:23 AM  Result Value Ref Range   Sodium 141 135 - 145 mmol/L   Potassium 4.9 3.5 - 5.1 mmol/L   Chloride 110 98 - 111 mmol/L    CO2 22 22 - 32 mmol/L   Glucose, Bld 138 (H) 70 - 99 mg/dL    Comment: Glucose reference range applies only to samples taken after fasting for at least 8 hours.   BUN 54 (H) 8 - 23 mg/dL   Creatinine, Ser 10.16 (H) 0.61 - 1.24 mg/dL   Calcium 8.2 (L) 8.9 - 10.3 mg/dL   GFR, Estimated 5 (L) >60 mL/min    Comment: (NOTE) Calculated using the CKD-EPI Creatinine Equation (2021)    Anion gap 9 5 - 15    Comment: Performed at [facility], [place] [address] [place], [place] [address]  Troponin I (High Sensitivity)     Status: Abnormal   Collection Time: 02/17/21  5:23 AM  Result Value Ref Range   Troponin I (High Sensitivity) 162 (HH) <18 ng/L    Comment: CRITICAL RESULT CALLED TO, READ BACK BY AND VERIFIED WITH: BLAIR,I. RN AT 0569 02/17/21 MULLINS,T (NOTE) Elevated high sensitivity troponin I (hsTnI) values and significant  changes across serial measurements may suggest ACS but many other  chronic and acute conditions are known to elevate hsTnI results.  Refer to the Links section for chest pain algorithms and additional  guidance. Performed at [facility], [place] [address], [place], [place] [address]   Brain natriuretic peptide     Status: Abnormal   Collection Time: 02/17/21  5:23 AM  Result Value Ref Range   B Natriuretic Peptide 983.1 (H) 0.0 - 100.0 pg/mL    Comment: Performed at [facility], [place] [address], [place], [place] [address]  Resp Panel by RT-PCR (Flu A&B, Covid) Nasopharyngeal Swab     Status: None   Collection Time: 02/17/21  6:59 AM   Specimen: Nasopharyngeal Swab; Nasopharyngeal(NP) swabs in vial  transport medium  Result Value Ref Range   SARS Coronavirus 2 by RT PCR NEGATIVE NEGATIVE    Comment: (NOTE) SARS-CoV-2 target nucleic acids are NOT DETECTED.  The SARS-CoV-2 RNA is generally detectable in upper respiratory specimens during the acute phase of infection. The lowest concentration of SARS-CoV-2 viral  copies this assay can detect is 138 copies/mL. A negative result does not preclude SARS-Cov-2 infection and should not be used as the sole basis for treatment or other patient management decisions. A negative result may occur with  improper specimen collection/handling, submission of specimen other than nasopharyngeal swab, presence of viral mutation(s) within the areas targeted by this assay, and inadequate number of viral copies(<138 copies/mL). A negative result must be combined with clinical observations, patient history, and epidemiological information. The expected result is Negative.  Fact Sheet for Patients:  EntrepreneurPulse.com.au  Fact Sheet for Healthcare Providers:  IncredibleEmployment.be  This test is no t yet approved or cleared by the [place] FDA and  has been authorized for detection and/or diagnosis of SARS-CoV-2 by FDA under an Emergency Use Authorization (EUA). This EUA will remain  in effect (meaning this test can be used) for the duration of the COVID-19 declaration under Section 564(b)(1) of the Act, 21 U.S.C.section 360bbb-3(b)(1), unless the authorization is terminated  or revoked sooner.       Influenza A by PCR NEGATIVE NEGATIVE   Influenza B by PCR NEGATIVE NEGATIVE    Comment: (NOTE) The Xpert Xpress SARS-CoV-2/FLU/RSV plus assay is intended as an aid in the diagnosis of influenza from Nasopharyngeal swab specimens and should not be used as a sole basis for treatment. Nasal washings and aspirates are unacceptable for Xpert Xpress SARS-CoV-2/FLU/RSV testing.  Fact Sheet for Patients: EntrepreneurPulse.com.au  Fact Sheet for Healthcare Providers: IncredibleEmployment.be  This test is not yet approved or cleared by the [place] FDA and has been authorized for detection and/or diagnosis of SARS-CoV-2 by FDA under an Emergency Use Authorization (EUA). This EUA will  remain in effect (meaning this test can be used) for the duration of the COVID-19 declaration under Section 564(b)(1) of the Act, 21 U.S.C. section 360bbb-3(b)(1), unless the authorization is terminated or revoked.  Performed at [facility], [place] [address], [place], [place] [address]    ROS: see hpi   Physical Exam: Vitals:   02/17/21 0730 02/17/21 0815  BP: (!) 172/100 (!) 173/87  Pulse: 94 87  Resp: 18 15  Temp:    SpO2: 100% 100%     General: Alert Elderly Male WD, WN ,NAD  HEENT: [place], EOMI, not icteric, MMM Neck: no jvd Heart: RRR, no MRG appreciated  Lungs:Faint crackles scattered ,non labored breathing on Rm Air  Abdomen: BS+, Soft, NT, ND  No ascites  Extremities: Trace bipedal edema Skin:  No overt rash, war dry , no pedal ulcers  Neuro: Alert OX3, moves all extremities  independently ,no overt focal deficits  Dialysis Access:  \LFA AVF   Pos bruit   Dialysis Orders: [place], TTS, EDW 67.5 2K, 2 CA bath    4 hours LFA AVF  Heparin 1200 Hectorol 2 mcg daily Mircera 100 MCG every 2 weeks however missing doses because of noncompliance with outpatient kidney center , last given 09/18/2020  Assessment/Plan 1. Volume  Overload/ pulmonary edema secondary to missed dialysis X2 tx recently and signs off early  / Plan HD today and eval  In am  Currently not SOB  On rm air , he is hesitant to  go 2 days in a row.( has poor insight) /noted has elevated troponins likely demand ischemia, no chest pain follow-up per admit 2. ESRD -HD TTS- HD today, K 4.9, SCr 10.37 ,   Scr as OP 9.4 ( 01/24/21) volume overload missed last 2 treatments- eval tomor if needed serial dialysis treatments  3. Hypertension urgency= resume blood pressure medications UF with dialysis to help 4. Anemia  -Hgb 7.8,  check iron sat, give today Aranesp 100 MCG q. weekly has been missing outpatient ESA because of no-shows at kidney Center 5. Metabolic bone disease -continue Hectorol on dialysis,  no binder listed, follow-up calcium phosphorus trend 6. DM2= per admit 7. HIV-meds per admit 8. Seizure disorder= meds per admit 9. Nutrition -diet renal carb  Ernest Haber, PA-C [facility] [phone] 02/17/2021, 9:00 AM     Seen and examined independently.  Agree with note and exam as documented above by physician extender and as noted here.  Pt with ESRD on HD TTS who presented with shortness of breath after missing outpatient HD.  He went to [place] then was transferred to [name] for care and HD.  States that he signs off early because he doesn't like how he feels at the end of treatment.  I have asked and he does not elaborate.  May need EDW adjustment but likely a concerning topic if he is noncompliant with treatments.   General adult male in bed in no acute distress HEENT normocephalic atraumatic extraocular movements intact sclera anicteric Neck supple trachea midline Lungs clear to auscultation bilaterally normal work of breathing at rest; room air  Heart regular rate and rhythm no rubs or gallops appreciated Abdomen soft nontender nondistended Extremities no edema lower extremities Psych no anxiety or agitation; insight is poor Neuro alert and oriented x 3 provides basic hx and follows commands  Access LUE AVF in use  ESRD- HD today then per TTS schedule while inpatient   HTN - optimize volume with HD and continue home regimen. I have concern re: compliance   Anemia CKD - misses ESA outpatient - to get here today  Metabolic bone disease on hectorol   Noncompliance with medical therapy - complicates his care.  We discussed that working with his outpatient team to make dialysis more comfortable (I.e coming to HD and staying full treatment so that we are more comfortable adjusting weights) is critical to his care   Disposition per primary team   Claudia Desanctis, MD 02/17/2021 4:21 PM

## 2021-02-17 NOTE — ED Triage Notes (Signed)
Patient arrives via EMS from [facility]. Patient woke up short of breath. Staff placed patient on 2L [place], O2 93-94. Per SNF, patient unable to tolerate taking O2 off.

## 2021-02-17 NOTE — ED Notes (Signed)
Unsure if IV is in place. Unable to pull blood back on assessment. MD made aware.

## 2021-02-17 NOTE — ED Provider Notes (Signed)
[facility] DEPT Provider Note   CSN: [id number] Arrival date & time: 02/17/21  4196     History Chief Complaint  Patient presents with  . Shortness of Breath    Richard [name] is a 65 y.o. male.  65 yo M with a chief complaints of shortness of breath.  Has noticed it all day today.  Improves with oxygen.  Denies cough congestion or fever denies chest pain or pressure denies abdominal pain nausea vomiting or diarrhea.  Not normally on oxygen at home.  Gets dialysis Tuesday Thursday and Saturday.  Has been compliant with his dialysis per him.  The history is provided by the patient.  Shortness of Breath Severity:  Moderate Onset quality:  Gradual Duration:  1 day Timing:  Constant Progression:  Unchanged Chronicity:  New Relieved by:  Nothing Worsened by:  Nothing Ineffective treatments:  None tried Associated symptoms: no abdominal pain, no chest pain, no fever, no headaches, no rash and no vomiting        Past Medical History:  Diagnosis Date  . Arthritis   . Diabetes mellitus, type II, insulin dependent ([place])   . GERD (gastroesophageal reflux disease)   . Hypertension   . Hypertensive emergency 05/21/2012  . Immune deficiency disorder ([place])   . Migraines   . Neuropathy in diabetes ([place])    bilat feet  . Ruptured lumbar disc   . Stroke [facility]) 2013   residual right sided weakness (arm>leg)    Patient Active Problem List   Diagnosis Date Noted  . Hemorrhoids 01/11/2021  . Hyperkalemia 01/10/2021  . Dialysis patient, noncompliant ([place])   . 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  . Syncope 06/01/2020  . Secondary hyperparathyroidism of renal origin ([place]) 05/28/2020  . COVID-19 05/16/2020  . Hypokalemia 05/16/2020  . Allergy, unspecified, initial encounter 05/08/2020  . Anaphylactic shock, unspecified, initial encounter 05/08/2020  . Anemia in chronic kidney disease 05/08/2020  . Complication of  vascular dialysis catheter 05/08/2020  . Dependence on renal dialysis ([place]) 05/08/2020  . Headache, unspecified 05/08/2020  . Iron deficiency anemia, unspecified 05/08/2020  . Cerebellar stroke syndrome 05/07/2020  . Encounter for feeding tube placement   . Weakness   . Creatinine elevation   . Goals of care, counseling/discussion   . Hypertensive urgency   . Meningoencephalitis   . Palliative care by specialist   . ESRD (end stage renal disease) ([place])   . AIDS (acquired immune deficiency syndrome) ([place]) 04/13/2020  . Encephalopathy 04/13/2020  . Seizure ([place]) 04/10/2020  . History of cerebrovascular accident (CVA) with residual deficit 03/19/2020  . CKD (chronic kidney disease) stage 5, GFR less than 15 ml/min (HCC) 02/15/2020  . Altered mental status 02/13/2020  . Neuropathy in diabetes ([place])   . Migraines   . Hypertension   . GERD (gastroesophageal reflux disease)   . Diabetes mellitus, type II, insulin dependent ([place])   . Arthritis   . Hyperlipidemia 08/25/2012  . Chronic kidney disease, stage 4 (severe) ([place]) 08/23/2012  . Tobacco abuse 08/23/2012  . Malnutrition of moderate degree ([place]) 08/23/2012  . 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 AV FISTULA PLACEMENT Left 04/25/2020   Procedure: LEFT ARM BRACHIOCEPHALIC ARTERIOVENOUS (AV) FISTULA CREATION;  Surgeon: Rosetta Posner, MD;  Location: [facility];  Service: Vascular;  Laterality: Left;  . IR FLUORO GUIDE CV LINE RIGHT  04/13/2020  . IR REMOVAL  TUN CV CATH W/O FL  01/14/2021  . IR US GUIDE VASC ACCESS RIGHT  04/13/2020  . KNEE ARTHROSCOPY Bilateral        Family History  Problem Relation Age of Onset  . Other Mother   . Stroke Father   . Hypertension Father   . Epilepsy Father   . Breast cancer Sister   . Stroke Brother   . Heart attack Brother   . Diabetes Sister     Social History   Tobacco Use  . Smoking status: Current  Every Day Smoker    Packs/day: 0.50    Types: Cigarettes  . Smokeless tobacco: Never Used  Vaping Use  . Vaping Use: Never used  Substance Use Topics  . Alcohol use: No  . Drug use: Yes    Types: Marijuana    Home Medications Prior to Admission medications   Medication Sig Start Date End Date Taking? Authorizing Provider  ACCU-CHEK AVIVA PLUS test strip 1 each by Other route See admin instructions. Use 1 test strip to test blood sugar two-three times daily 04/20/18   [provider]  ACCU-CHEK AVIVA PLUS test strip USE 1 strip TO test blood sugar 2 TIMES DAILY TO 3 TIMES DAILY Patient taking differently: 1 each by Other route in the morning, at noon, and at bedtime. 06/10/19   [name], MD  acetaminophen (TYLENOL) 325 MG tablet Take 650 mg by mouth every 6 (six) hours as needed for moderate pain.    [provider]  bictegravir-emtricitabine-tenofovir AF (BIKTARVY) 50-200-25 MG TABS tablet Take 1 tablet by mouth daily. 05/01/20   Oswald Hillock, MD  blood glucose meter kit and supplies KIT Dispense based on patient and insurance preference. Check blood sugar 4 times a day Patient taking differently: Inject 1 each into the skin See admin instructions. Dispense based on patient and insurance preference. Check blood sugar 4 times a day 06/11/18   [name], MD  carvedilol (COREG) 3.125 MG tablet Take 1 tablet (3.125 mg total) by mouth 2 (two) times daily with a meal. 05/07/20 06/06/20  Arrien, Jimmy Picket, MD  cetirizine (ZYRTEC) 10 MG tablet Take 10 mg by mouth daily.    [provider]  Cholecalciferol (VITAMIN D) 50 MCG (2000 UT) tablet Take 1 tablet (2,000 Units total) by mouth in the morning. 03/07/20   [name], MD  clopidogrel (PLAVIX) 75 MG tablet Take 1 tablet (75 mg total) by mouth daily. 03/07/20   [name], MD  Darbepoetin Alfa (ARANESP) 60 MCG/0.3ML SOSY injection Inject 0.3 mLs (60 mcg total) into the vein every  Saturday with hemodialysis. 01/19/21   Bonnielee Haff, MD  doxepin (SINEQUAN) 75 MG capsule Take 75 mg by mouth at bedtime.    [provider]  insulin aspart (NOVOLOG) 100 UNIT/ML injection Inject 0-9 Units into the skin 3 (three) times daily with meals. For glucose 150 to 200 use 2 units, for 201 to 250 use 3 units, for 251 to 300 use 5 units, for 301 to 350 use 7 units for 351 units or above use 9 units. 05/07/20 06/06/20  Arrien, Jimmy Picket, MD  insulin glargine (LANTUS) 100 UNIT/ML injection Inject 0.05 mLs (5 Units total) into the skin at bedtime. 05/07/20 06/06/20  Arrien, Jimmy Picket, MD  Insulin Pen Needle (SURE COMFORT PEN NEEDLES) 31G X 8 MM MISC Use as directed twice daily with Novolog flex pen Patient taking differently: 1 each by Other route See admin instructions.  Use as directed twice daily with Novolog flex pen 02/10/19   Koberlein, [name], MD  levETIRAcetam (KEPPRA) 500 MG tablet Take 1 tablet (500 mg total) by mouth 2 (two) times daily. 03/07/20 06/01/20  [name], MD  Misc. Devices (COMMODE 3-IN-1) MISC Use as directed 02/24/20   [name], MD  Misc. Devices (TRANSFER BENCH) MISC Use as directed 02/24/20   [name], MD  Multiple Vitamin (MULTIVITAMIN WITH MINERALS) TABS tablet Take 1 tablet by mouth daily.    [provider]  Nutritional Supplements (FEEDING SUPPLEMENT, NEPRO CARB STEADY,) LIQD Take 237 mLs by mouth 3 (three) times daily with meals.    [provider]  pantoprazole (PROTONIX) 40 MG tablet TAKE 1 TABLET BY MOUTH EVERY DAY Patient taking differently: Take 40 mg by mouth daily. 01/23/20   [name], MD  pravastatin (PRAVACHOL) 80 MG tablet Take 1 tablet (80 mg total) by mouth at bedtime. 03/07/20   [name], MD  sulfamethoxazole-trimethoprim (BACTRIM DS) 800-160 MG tablet Take 1 tablet by mouth 3 (three) times a week. 04/23/20   Little Ishikawa, MD  tamsulosin (FLOMAX) 0.4 MG CAPS capsule  TAKE 1 CAPSULE BY MOUTH EVERY DAY Patient taking differently: Take 0.4 mg by mouth daily. 03/28/20   [name], MD  triamcinolone cream (KENALOG) 0.1 % Apply 1 application topically 2 (two) times daily.    [provider]  TRUEPLUS INSULIN SYRINGE 31G X 5/16" 1 ML MISC Use pen needle with insulin 2 times daily Patient taking differently: 1 each by Other route in the morning and at bedtime. With insulin 03/31/19   [name], MD    Allergies    Oxycodone  Review of Systems   Review of Systems  Constitutional: Negative for chills and fever.  HENT: Negative for congestion and facial swelling.   Eyes: Negative for discharge and visual disturbance.  Respiratory: Positive for shortness of breath.   Cardiovascular: Negative for chest pain and palpitations.  Gastrointestinal: Negative for abdominal pain, diarrhea and vomiting.  Musculoskeletal: Negative for arthralgias and myalgias.  Skin: Negative for color change and rash.  Neurological: Negative for tremors, syncope and headaches.  Psychiatric/Behavioral: Negative for confusion and dysphoric mood.    Physical Exam Updated Vital Signs BP (!) 195/104   Pulse (!) 104   Temp 98 F (36.7 C)   Resp 19   SpO2 100%   Physical Exam Vitals and nursing note reviewed.  Constitutional:      Appearance: He is well-developed.  HENT:     Head: Normocephalic and atraumatic.  Eyes:     Pupils: Pupils are equal, round, and reactive to light.  Neck:     Vascular: No JVD.  Cardiovascular:     Rate and Rhythm: Normal rate and regular rhythm.     Heart sounds: No murmur heard. No friction rub. No gallop.   Pulmonary:     Effort: No respiratory distress.     Breath sounds: No decreased breath sounds or wheezing.  Abdominal:     General: There is no distension.     Tenderness: There is no guarding or rebound.  Musculoskeletal:        General: Normal range of motion.     Cervical back: Normal range of motion and neck  supple.  Skin:    Coloration: Skin is not pale.     Findings: No rash.  Neurological:     Mental Status: He is alert and oriented  to person, place, and time.  Psychiatric:        Behavior: Behavior normal.     ED Results / Procedures / Treatments   Labs (all labs ordered are listed, but only abnormal results are displayed) Labs Reviewed  CBC WITH DIFFERENTIAL/PLATELET - Abnormal; Notable for the following components:      Result Value   RBC 2.52 (*)    Hemoglobin 7.8 (*)    HCT 25.3 (*)    MCV 100.4 (*)    RDW 15.8 (*)    Platelets 145 (*)    All other components within normal limits  BASIC METABOLIC PANEL - Abnormal; Notable for the following components:   Glucose, Bld 138 (*)    BUN 54 (*)    Creatinine, Ser 10.16 (*)    Calcium 8.2 (*)    GFR, Estimated 5 (*)    All other components within normal limits  BRAIN NATRIURETIC PEPTIDE - Abnormal; Notable for the following components:   B Natriuretic Peptide 983.1 (*)    All other components within normal limits  TROPONIN I (HIGH SENSITIVITY) - Abnormal; Notable for the following components:   Troponin I (High Sensitivity) 162 (*)    All other components within normal limits  RESP PANEL BY RT-PCR (FLU A&B, COVID) ARPGX2  TROPONIN I (HIGH SENSITIVITY)    EKG EKG Interpretation  Date/Time:  'Sunday Feb 17 2021 03:53:46 EDT Ventricular Rate:  102 PR Interval:  157 QRS Duration: 82 QT Interval:  389 QTC Calculation: 507 R Axis:   63 Text Interpretation: Sinus tachycardia Consider right atrial enlargement Anteroseptal infarct, old Prolonged QT interval No significant change since last tracing Confirmed by Channon Ambrosini ([id number]) on 02/17/2021 4:21:16 AM   Radiology DG Chest 2 View  Result Date: 02/17/2021 CLINICAL DATA:  Shortness of breath EXAM: CHEST - 2 VIEW COMPARISON:  01/10/2021 FINDINGS: Increased interstitial markings, suggesting mild interstitial edema. However, there are no pleural effusions. No pneumothorax. Heart  is top-normal in size. Prior right IJ dual lumen dialysis catheter has been removed. IMPRESSION: Suspected mild interstitial edema.  No pleural effusions. Electronically Signed   By: Sriyesh  Krishnan M.D.   On: 02/17/2021 05:18    Procedures Procedures   Medications Ordered in ED Medications  nitroGLYCERIN (NITROSTAT) SL tablet 0.4 mg (0.4 mg Sublingual Given 02/17/21 0649)  Chlorhexidine Gluconate Cloth 2 % PADS 6 each (has no administration in time range)  carvedilol (COREG) tablet 3.125 mg (3.125 mg Oral Given 02/17/21 0644)  labetalol (NORMODYNE) tablet 100 mg (100 mg Oral Given 02/17/21 0645)    ED Course  I have reviewed the triage vital signs and the nursing notes.  Pertinent labs & imaging results that were available during my care of the patient were reviewed by me and considered in my medical decision making (see chart for details).    MDM Rules/Calculators/A&P                          64'  yo M with a chief complaints of shortness of breath.  Been going on just today.  Denies any other symptoms with this.  Hypertensive on arrival with a blood pressure over 264 systolic.  States has been compliant with his dialysis.  No obvious signs of fluid overload on exam.  Chest x-ray viewed by me with fluid in the fissure and slight increase central pulmonary markings from prior.  Will obtain a laboratory evaluation.  Labs with elevated trop, BNP elevated.  HTN here.  Discussed with Dr. Royce Macadamia, nephrology.  Recommends hospitalist admit to [facility] will dialyze today.   CRITICAL CARE Performed by: Cecilio Asper   Total critical care time: 35 minutes  Critical care time was exclusive of separately billable procedures and treating other patients.  Critical care was necessary to treat or prevent imminent or life-threatening deterioration.  Critical care was time spent personally by me on the following activities: development of treatment plan with patient and/or surrogate as well as  nursing, discussions with consultants, evaluation of patient's response to treatment, examination of patient, obtaining history from patient or surrogate, ordering and performing treatments and interventions, ordering and review of laboratory studies, ordering and review of radiographic studies, pulse oximetry and re-evaluation of patient's condition.  The patients results and plan were reviewed and discussed.   Any x-rays performed were independently reviewed by myself.   Differential diagnosis were considered with the presenting HPI.  Medications  nitroGLYCERIN (NITROSTAT) SL tablet 0.4 mg (0.4 mg Sublingual Given 02/17/21 0649)  Chlorhexidine Gluconate Cloth 2 % PADS 6 each (has no administration in time range)  carvedilol (COREG) tablet 3.125 mg (3.125 mg Oral Given 02/17/21 0644)  labetalol (NORMODYNE) tablet 100 mg (100 mg Oral Given 02/17/21 0645)    Vitals:   02/17/21 0630 02/17/21 0645 02/17/21 0653 02/17/21 0700  BP: (!) 223/107 (!) 217/111 (!) 197/105 (!) 195/104  Pulse: (!) 108 (!) 103 (!) 103 (!) 104  Resp: (!) '23 20 20 19  ' Temp:    98 F (36.7 C)  TempSrc:      SpO2: 98% 100% 100% 100%    Final diagnoses:  Shortness of breath  Hypervolemia, unspecified hypervolemia type    Admission/ observation were discussed with the admitting physician, patient and/or family and they are comfortable with the plan.    Final Clinical Impression(s) / ED Diagnoses Final diagnoses:  Shortness of breath  Hypervolemia, unspecified hypervolemia type    Rx / DC Orders ED Discharge Orders    None       Deno Etienne, DO 02/17/21 0703

## 2021-02-17 NOTE — ED Notes (Signed)
This nurse gave report to Truman Hayward RN at [facility] 6th floor.

## 2021-02-17 NOTE — H&P (Signed)
History and Physical    Waldemar Siegel WNU:[id number] DOB: January 08, 1956 DOA: 02/17/2021  PCP: [name], MD  Patient coming from: Home  Chief Complaint: dyspnea  HPI: Richard [name] is a 65 y.o. male with medical history significant of HTN, DM2, ESRD on HD. Presenting with dyspnea. He is giving limited history. Keep referring me to the chart: "It's in the chart! It's in the chart." Reports that his dyspnea began last night. It was a sudden onset. No fevers, cough, or chest pain. He had no sick contacts. He became concerned and came to the ED. He denies any other aggravating or alleviating factors.    ED Course: CXR showed some interstitial edema. BNP was elevated. EKG did not have any st elevations. Trp was elevated. Nephrology was consulted. They recommended transfer to [facility] for dialysis. [facility] was called for admission.   Review of Systems:  Denies CP, palpitations, syncopal episodes, N/V/D, fevers, sick contacts, cough, wheeze. Review of systems is otherwise negative for all not mentioned in HPI.   PMHx Past Medical History:  Diagnosis Date  . Arthritis   . Diabetes mellitus, type II, insulin dependent ([place])   . GERD (gastroesophageal reflux disease)   . Hypertension   . Hypertensive emergency 05/21/2012  . Immune deficiency disorder ([place])   . Migraines   . Neuropathy in diabetes ([place])    bilat feet  . Ruptured lumbar disc   . Stroke [facility]) 2013   residual right sided weakness (arm>leg)    PSHx Past Surgical History:  Procedure Laterality Date  . AV FISTULA PLACEMENT Left 04/25/2020   Procedure: LEFT ARM BRACHIOCEPHALIC ARTERIOVENOUS (AV) FISTULA CREATION;  Surgeon: Rosetta Posner, MD;  Location: [place];  Service: Vascular;  Laterality: Left;  . IR FLUORO GUIDE CV LINE RIGHT  04/13/2020  . IR REMOVAL TUN CV CATH W/O FL  01/14/2021  . IR US GUIDE VASC ACCESS RIGHT  04/13/2020  . KNEE ARTHROSCOPY Bilateral     SocHx  reports that he has been smoking cigarettes.  He has been smoking about 0.50 packs per day. He has never used smokeless tobacco. He reports current drug use. Drug: Marijuana. He reports that he does not drink alcohol.  Allergies  Allergen Reactions  . Oxycodone Nausea And Vomiting    FamHx Family History  Problem Relation Age of Onset  . Other Mother   . Stroke Father   . Hypertension Father   . Epilepsy Father   . Breast cancer Sister   . Stroke Brother   . Heart attack Brother   . Diabetes Sister     Prior to Admission medications   Medication Sig Start Date End Date Taking? Authorizing Provider  ACCU-CHEK AVIVA PLUS test strip 1 each by Other route See admin instructions. Use 1 test strip to test blood sugar two-three times daily 04/20/18   [provider]  ACCU-CHEK AVIVA PLUS test strip USE 1 strip TO test blood sugar 2 TIMES DAILY TO 3 TIMES DAILY Patient taking differently: 1 each by Other route in the morning, at noon, and at bedtime. 06/10/19   [name], MD  acetaminophen (TYLENOL) 325 MG tablet Take 650 mg by mouth every 6 (six) hours as needed for moderate pain.    [provider]  bictegravir-emtricitabine-tenofovir AF (BIKTARVY) 50-200-25 MG TABS tablet Take 1 tablet by mouth daily. 05/01/20   Oswald Hillock, MD  blood glucose meter kit and supplies KIT Dispense based on patient and insurance preference. Check  blood sugar 4 times a day Patient taking differently: Inject 1 each into the skin See admin instructions. Dispense based on patient and insurance preference. Check blood sugar 4 times a day 06/11/18   [name], MD  carvedilol (COREG) 3.125 MG tablet Take 1 tablet (3.125 mg total) by mouth 2 (two) times daily with a meal. 05/07/20 06/06/20  Arrien, Jimmy Picket, MD  cetirizine (ZYRTEC) 10 MG tablet Take 10 mg by mouth daily.    [provider]  Cholecalciferol (VITAMIN D) 50 MCG (2000 UT) tablet Take 1 tablet (2,000 Units total) by mouth in the morning. 03/07/20    [name], MD  clopidogrel (PLAVIX) 75 MG tablet Take 1 tablet (75 mg total) by mouth daily. 03/07/20   [name], MD  Darbepoetin Alfa (ARANESP) 60 MCG/0.3ML SOSY injection Inject 0.3 mLs (60 mcg total) into the vein every Saturday with hemodialysis. 01/19/21   Bonnielee Haff, MD  doxepin (SINEQUAN) 75 MG capsule Take 75 mg by mouth at bedtime.    [provider]  insulin aspart (NOVOLOG) 100 UNIT/ML injection Inject 0-9 Units into the skin 3 (three) times daily with meals. For glucose 150 to 200 use 2 units, for 201 to 250 use 3 units, for 251 to 300 use 5 units, for 301 to 350 use 7 units for 351 units or above use 9 units. 05/07/20 06/06/20  Arrien, Jimmy Picket, MD  insulin glargine (LANTUS) 100 UNIT/ML injection Inject 0.05 mLs (5 Units total) into the skin at bedtime. 05/07/20 06/06/20  Arrien, Jimmy Picket, MD  Insulin Pen Needle (SURE COMFORT PEN NEEDLES) 31G X 8 MM MISC Use as directed twice daily with Novolog flex pen Patient taking differently: 1 each by Other route See admin instructions. Use as directed twice daily with Novolog flex pen 02/10/19   Koberlein, [name], MD  levETIRAcetam (KEPPRA) 500 MG tablet Take 1 tablet (500 mg total) by mouth 2 (two) times daily. 03/07/20 06/01/20  [name], MD  Misc. Devices (COMMODE 3-IN-1) MISC Use as directed 02/24/20   [name], MD  Misc. Devices (TRANSFER BENCH) MISC Use as directed 02/24/20   [name], MD  Multiple Vitamin (MULTIVITAMIN WITH MINERALS) TABS tablet Take 1 tablet by mouth daily.    [provider]  Nutritional Supplements (FEEDING SUPPLEMENT, NEPRO CARB STEADY,) LIQD Take 237 mLs by mouth 3 (three) times daily with meals.    [provider]  pantoprazole (PROTONIX) 40 MG tablet TAKE 1 TABLET BY MOUTH EVERY DAY Patient taking differently: Take 40 mg by mouth daily. 01/23/20   [name], MD  pravastatin (PRAVACHOL) 80 MG tablet Take 1 tablet (80 mg  total) by mouth at bedtime. 03/07/20   [name], MD  sulfamethoxazole-trimethoprim (BACTRIM DS) 800-160 MG tablet Take 1 tablet by mouth 3 (three) times a week. 04/23/20   Little Ishikawa, MD  tamsulosin (FLOMAX) 0.4 MG CAPS capsule TAKE 1 CAPSULE BY MOUTH EVERY DAY Patient taking differently: Take 0.4 mg by mouth daily. 03/28/20   [name], MD  triamcinolone cream (KENALOG) 0.1 % Apply 1 application topically 2 (two) times daily.    [provider]  TRUEPLUS INSULIN SYRINGE 31G X 5/16" 1 ML MISC Use pen needle with insulin 2 times daily Patient taking differently: 1 each by Other route in the morning and at bedtime. With insulin 03/31/19   [name], MD    Physical Exam: Vitals:   02/17/21 2376 02/17/21  0700 02/17/21 0730 02/17/21 0815  BP: (!) 197/105 (!) 195/104 (!) 172/100 (!) 173/87  Pulse: (!) 103 (!) 104 94 87  Resp: '20 19 18 15  ' Temp:  98 F (36.7 C)    TempSrc:      SpO2: 100% 100% 100% 100%    General: 65 y.o. male resting in bed in NAD Eyes: PERRL, normal sclera ENMT: Nares patent w/o discharge, orophaynx clear, dentition normal, ears w/o discharge/lesions/ulcers Neck: Supple, trachea midline Cardiovascular: RRR, +S1, S2, no m/g/r, equal pulses throughout Respiratory: soft scattered crackles, no w/r, normal WOB on 2L [place] GI: BS+, NDNT, no masses noted, no organomegaly noted MSK: No c/c, BLE pedal edema Skin: No rashes, bruises, ulcerations noted Neuro: A&O x 3, no focal deficits Psyc: Appropriate interaction and affect, calm/cooperative  Labs on Admission: I have personally reviewed following labs and imaging studies  CBC: Recent Labs  Lab 02/17/21 0523  WBC 8.0  NEUTROABS 5.8  HGB 7.8*  HCT 25.3*  MCV 100.4*  PLT 841*   Basic Metabolic Panel: Recent Labs  Lab 02/17/21 0523  NA 141  K 4.9  CL 110  CO2 22  GLUCOSE 138*  BUN 54*  CREATININE 10.16*  CALCIUM 8.2*   GFR: CrCl cannot be calculated (Unknown ideal  weight.). Liver Function Tests: No results for input(s): AST, ALT, ALKPHOS, BILITOT, PROT, ALBUMIN in the last 168 hours. No results for input(s): LIPASE, AMYLASE in the last 168 hours. No results for input(s): AMMONIA in the last 168 hours. Coagulation Profile: No results for input(s): INR, PROTIME in the last 168 hours. Cardiac Enzymes: No results for input(s): CKTOTAL, CKMB, CKMBINDEX, TROPONINI in the last 168 hours. BNP (last 3 results) No results for input(s): PROBNP in the last 8760 hours. HbA1C: No results for input(s): HGBA1C in the last 72 hours. CBG: No results for input(s): GLUCAP in the last 168 hours. Lipid Profile: No results for input(s): CHOL, HDL, LDLCALC, TRIG, CHOLHDL, LDLDIRECT in the last 72 hours. Thyroid Function Tests: No results for input(s): TSH, T4TOTAL, FREET4, T3FREE, THYROIDAB in the last 72 hours. Anemia Panel: No results for input(s): VITAMINB12, FOLATE, FERRITIN, TIBC, IRON, RETICCTPCT in the last 72 hours. Urine analysis:    Component Value Date/Time   COLORURINE STRAW (A) 04/11/2020 0223   APPEARANCEUR CLEAR 04/11/2020 0223   LABSPEC 1.011 04/11/2020 0223   PHURINE 5.0 04/11/2020 0223   GLUCOSEU 50 (A) 04/11/2020 0223   HGBUR SMALL (A) 04/11/2020 0223   BILIRUBINUR NEGATIVE 04/11/2020 0223   KETONESUR NEGATIVE 04/11/2020 0223   PROTEINUR >=300 (A) 04/11/2020 0223   UROBILINOGEN 0.2 11/29/2014 2003   NITRITE NEGATIVE 04/11/2020 0223   LEUKOCYTESUR NEGATIVE 04/11/2020 0223    Radiological Exams on Admission: DG Chest 2 View  Result Date: 02/17/2021 CLINICAL DATA:  Shortness of breath EXAM: CHEST - 2 VIEW COMPARISON:  01/10/2021 FINDINGS: Increased interstitial markings, suggesting mild interstitial edema. However, there are no pleural effusions. No pneumothorax. Heart is top-normal in size. Prior right IJ dual lumen dialysis catheter has been removed. IMPRESSION: Suspected mild interstitial edema.  No pleural effusions. Electronically Signed    By: Julian Hy M.D.   On: 02/17/2021 05:18    EKG: Independently reviewed. Sinus, no st elevations  Assessment/Plan Dyspnea Volume overload ESRD on HD     - admit to inpt, progressive at [facility]     - nephrology consulted, appreciate assistance     - going for dialysis today     - BNP is elevated; will check  echo as well  HTN urgency     - resume home medications     - add clonidine patch     - dialysis will help as well  DM2     - SSI, DM diet, glucose checks, a1c  Elevated troponins     - likely demand ischemia     - no CP, EKG is ok     - cycle second set, if continuing to rise, will speak with cardiology  HIV     - resume home regimen  Seizure d/o     - resume home regimen  HLD     - resume home regimen  Anemia of chronic disease Macrocytosis     - no evidence of bleed, follow  DVT prophylaxis: heparin  Code Status: FULL  Family Communication: None at bedside  Consults called: EDP spoke with nephrology Royce Macadamia)   Status is: Inpatient  Remains inpatient appropriate because:Inpatient level of care appropriate due to severity of illness   Dispo: The patient is from: Home              Anticipated d/c is to: Home              Patient currently is not medically stable to d/c.   Difficult to place patient No  Time spent coordinating admission: 70 minutes  [place] [facility]  If 7PM-7AM, please contact night-coverage [email or web address]  02/17/2021, 8:32 AM

## 2021-02-17 NOTE — ED Notes (Signed)
[facility] at bedside to get patient.

## 2021-02-17 NOTE — Progress Notes (Signed)
Date and time results received: 02/17/21 1308 (use smartphrase ".now" to insert current time)  Test: Troponin Critical Value: 208  Name of Provider Notified: Oren Binet MD  Orders Received? Or Actions Taken?: No orders received.

## 2021-02-17 NOTE — Progress Notes (Signed)
Attempted to obtain venous access x 1 with no success. Positive blood return. Pt has a R arm contraction and unable to thread the catheter completely. Pt refused anymore attempts at a PIV. RN at bedside and aware.

## 2021-02-17 NOTE — ED Notes (Signed)
This nurse called [facility] to transport patient [name] to [facility]

## 2021-02-17 NOTE — ED Notes (Addendum)
Mickel Baas, RN with IV team stating that due to patient's vasculature and lack of IV medications, it is not appropriate time to place a PIV or midline. MD aware.

## 2021-02-18 DIAGNOSIS — R7989 Other specified abnormal findings of blood chemistry: Secondary | ICD-10-CM | POA: Diagnosis present

## 2021-02-18 DIAGNOSIS — I5033 Acute on chronic diastolic (congestive) heart failure: Secondary | ICD-10-CM

## 2021-02-18 DIAGNOSIS — Z9115 Patient's noncompliance with renal dialysis: Secondary | ICD-10-CM

## 2021-02-18 DIAGNOSIS — I160 Hypertensive urgency: Secondary | ICD-10-CM

## 2021-02-18 DIAGNOSIS — E8779 Other fluid overload: Secondary | ICD-10-CM | POA: Diagnosis not present

## 2021-02-18 DIAGNOSIS — R778 Other specified abnormalities of plasma proteins: Secondary | ICD-10-CM

## 2021-02-18 DIAGNOSIS — F32A Depression, unspecified: Secondary | ICD-10-CM | POA: Diagnosis present

## 2021-02-18 LAB — GLUCOSE, CAPILLARY
Glucose-Capillary: 90 mg/dL (ref 70–99)
Glucose-Capillary: 129 mg/dL — ABNORMAL HIGH (ref 70–99)
Glucose-Capillary: 172 mg/dL — ABNORMAL HIGH (ref 70–99)
Glucose-Capillary: 156 mg/dL — ABNORMAL HIGH (ref 70–99)

## 2021-02-18 MED ORDER — LEVETIRACETAM 500 MG PO TABS
500.0000 mg | ORAL_TABLET | Freq: Two times a day (BID) | ORAL | Status: DC
  Administered 2021-02-18 – 2021-02-21 (×7): 500 mg via ORAL
  Filled 2021-02-18 (×7): qty 1

## 2021-02-18 MED ORDER — DOXEPIN HCL 25 MG PO CAPS
25.0000 mg | ORAL_CAPSULE | Freq: Every day | ORAL | Status: DC
  Administered 2021-02-18 – 2021-02-20 (×3): 25 mg via ORAL
  Filled 2021-02-18 (×4): qty 1

## 2021-02-18 MED ORDER — TAMSULOSIN HCL 0.4 MG PO CAPS
0.4000 mg | ORAL_CAPSULE | Freq: Every day | ORAL | Status: DC
  Administered 2021-02-18 – 2021-02-21 (×4): 0.4 mg via ORAL
  Filled 2021-02-18 (×4): qty 1

## 2021-02-18 MED ORDER — CARVEDILOL 12.5 MG PO TABS
12.5000 mg | ORAL_TABLET | Freq: Two times a day (BID) | ORAL | Status: DC
  Administered 2021-02-18 – 2021-02-21 (×7): 12.5 mg via ORAL
  Filled 2021-02-18 (×7): qty 1

## 2021-02-18 MED ORDER — CLOPIDOGREL BISULFATE 75 MG PO TABS
75.0000 mg | ORAL_TABLET | Freq: Every day | ORAL | Status: DC
  Administered 2021-02-18 – 2021-02-21 (×4): 75 mg via ORAL
  Filled 2021-02-18 (×3): qty 1

## 2021-02-18 MED ORDER — DOXEPIN HCL 75 MG PO CAPS
75.0000 mg | ORAL_CAPSULE | Freq: Every day | ORAL | Status: DC
  Filled 2021-02-18: qty 1

## 2021-02-18 MED ORDER — SULFAMETHOXAZOLE-TRIMETHOPRIM 800-160 MG PO TABS
1.0000 | ORAL_TABLET | ORAL | Status: DC
  Administered 2021-02-18 – 2021-02-20 (×2): 1 via ORAL
  Filled 2021-02-18 (×2): qty 1

## 2021-02-18 MED ORDER — PRAVASTATIN SODIUM 40 MG PO TABS
80.0000 mg | ORAL_TABLET | Freq: Every day | ORAL | Status: DC
  Administered 2021-02-18 – 2021-02-20 (×3): 80 mg via ORAL
  Filled 2021-02-18 (×4): qty 2

## 2021-02-18 MED ORDER — BICTEGRAVIR-EMTRICITAB-TENOFOV 50-200-25 MG PO TABS
1.0000 | ORAL_TABLET | Freq: Every day | ORAL | Status: DC
  Administered 2021-02-18 – 2021-02-21 (×4): 1 via ORAL
  Filled 2021-02-18 (×4): qty 1

## 2021-02-18 MED ORDER — DOXEPIN HCL 50 MG PO CAPS
50.0000 mg | ORAL_CAPSULE | Freq: Every day | ORAL | Status: DC
  Administered 2021-02-18 – 2021-02-20 (×3): 50 mg via ORAL
  Filled 2021-02-18 (×4): qty 1

## 2021-02-18 NOTE — NC FL2 (Signed)
[place] LEVEL OF CARE SCREENING TOOL     IDENTIFICATION  Patient Name: Richard [name] Birthdate: 05-11-1956 Sex: male Admission Date (Current Location): 02/17/2021  [facility] and [place] Number:  Herbalist and Address:  [facility] [place], [place] [address], [place], [place] [address]      Provider Number: [id number]  Attending Physician Name and Address:  [place] Eric J, DO  Relative Name and Phone Number:  [name] [phone]    Current Level of Care: Hospital Recommended Level of Care: [place] Prior Approval Number:    Date Approved/Denied:   PASRR Number: [id number]  Discharge Plan: SNF    Current Diagnoses: Patient Active Problem List   Diagnosis Date Noted  . Elevated troponin 02/18/2021  . Acute on chronic diastolic CHF (congestive heart failure) ([place]) 02/18/2021  . Depression 02/18/2021  . Hemorrhoids 01/11/2021  . Hyperkalemia 01/10/2021  . Dialysis patient, noncompliant ([place])   . 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  . Syncope 06/01/2020  . Secondary hyperparathyroidism of renal origin ([place]) 05/28/2020  . COVID-19 05/16/2020  . Hypokalemia 05/16/2020  . Allergy, unspecified, initial encounter 05/08/2020  . Anaphylactic shock, unspecified, initial encounter 05/08/2020  . Anemia in chronic kidney disease 05/08/2020  . Complication of vascular dialysis catheter 05/08/2020  . Dependence on renal dialysis ([place]) 05/08/2020  . Headache, unspecified 05/08/2020  . Iron deficiency anemia, unspecified 05/08/2020  . Cerebellar stroke syndrome 05/07/2020  . Encounter for feeding tube placement   . Weakness   . Creatinine elevation   . Goals of care, counseling/discussion   . Hypertensive urgency   . Meningoencephalitis   . Palliative care by specialist   . ESRD (end stage renal disease) ([place])   . AIDS (acquired immune deficiency syndrome) ([place]) 04/13/2020  .  Encephalopathy 04/13/2020  . Seizure ([place]) 04/10/2020  . History of cerebrovascular accident (CVA) with residual deficit 03/19/2020  . CKD (chronic kidney disease) stage 5, GFR less than 15 ml/min (HCC) 02/15/2020  . Altered mental status 02/13/2020  . Neuropathy in diabetes ([place])   . Migraines   . Hypertension   . GERD (gastroesophageal reflux disease)   . DM2 (diabetes mellitus, type 2) ([place])   . Arthritis   . Hyperlipidemia 08/25/2012  . Chronic kidney disease, stage 4 (severe) ([place]) 08/23/2012  . Tobacco abuse 08/23/2012  . Malnutrition of moderate degree ([place]) 08/23/2012  . 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 05/21/2012    Orientation RESPIRATION BLADDER Height & Weight     Self,Time,Situation,Place  Normal Continent Weight: 165 lb 2 oz (74.9 kg) Height:     BEHAVIORAL SYMPTOMS/MOOD NEUROLOGICAL BOWEL NUTRITION STATUS      Continent Diet (Please see Discharge Summary)  AMBULATORY STATUS COMMUNICATION OF NEEDS Skin   Extensive Assist Verbally Other (Comment) (Abrasion foot Left)                       Personal Care Assistance Level of Assistance  Bathing,Feeding,Dressing Bathing Assistance: Limited assistance Feeding assistance: Independent Dressing Assistance: Limited assistance     Functional Limitations Info  Sight,Hearing,Speech Sight Info: Adequate (WDL) Hearing Info: Adequate (WDL) Speech Info: Adequate (WDL)    SPECIAL CARE FACTORS FREQUENCY  PT (By licensed PT),OT (By licensed OT)     PT Frequency: 5x min weekly OT Frequency: 5x min weekly            Contractures Contractures Info: Not present    Additional  Factors Info  Code Status,Allergies,Psychotropic,Insulin Sliding Scale Code Status Info: FULL Allergies Info: Oxycodone Psychotropic Info: doxepin (SINEQUAN) capsule 75 mg daily at bedtime Insulin Sliding Scale Info: insulin aspart (novoLOG) injection 0-5 Units daily at bedtime,insulin aspart (novoLOG) injection  0-6 Units 3 times daily with meals       Current Medications (02/18/2021):  This is the current hospital active medication list Current Facility-Administered Medications  Medication Dose Route Frequency Provider Last Rate Last Admin  . acetaminophen (TYLENOL) tablet 650 mg  650 mg Oral Q6H PRN Marylyn Ishihara, Tyrone A, DO       Or  . acetaminophen (TYLENOL) suppository 650 mg  650 mg Rectal Q6H PRN Marylyn Ishihara, Tyrone A, DO      . bictegravir-emtricitabine-tenofovir AF (BIKTARVY) 50-200-25 MG per tablet 1 tablet  1 tablet Oral Daily [place] Donnamarie Poag, DO   1 tablet at 02/18/21 1116  . carvedilol (COREG) tablet 12.5 mg  12.5 mg Oral BID WC [place] Donnamarie Poag, DO   12.5 mg at 02/18/21 1116  . Chlorhexidine Gluconate Cloth 2 % PADS 6 each  6 each Topical Q0600 Kyle, Tyrone A, DO   6 each at 02/17/21 1245  . cloNIDine (CATAPRES - Dosed in mg/24 hr) patch 0.2 mg  0.2 mg Transdermal Weekly Kyle, Tyrone A, DO   0.2 mg at 02/17/21 1032  . cloNIDine (CATAPRES) tablet 0.2 mg  0.2 mg Oral Q8H PRN Jonetta Osgood, MD   0.2 mg at 02/17/21 1245  . clopidogrel (PLAVIX) tablet 75 mg  75 mg Oral Daily [place] Eric J, DO   75 mg at 02/18/21 1116  . [START ON 02/26/2021] Darbepoetin Alfa (ARANESP) injection 100 mcg  100 mcg Intravenous Q Tue-HD Zeyfang, David, PA-C      . doxepin (SINEQUAN) capsule 25 mg  25 mg Oral QHS [place] Donnamarie Poag, DO       And  . doxepin (SINEQUAN) capsule 50 mg  50 mg Oral QHS [place] Eric J, DO      . [START ON 02/19/2021] doxercalciferol (HECTOROL) injection 2 mcg  2 mcg Intravenous Q T,Th,Sa-HD Ernest Haber, PA-C      . heparin injection 5,000 Units  5,000 Units Subcutaneous Q8H Kyle, Tyrone A, DO   5,000 Units at 02/18/21 1332  . hydrALAZINE (APRESOLINE) injection 10 mg  10 mg Intravenous Q8H PRN Marylyn Ishihara, Tyrone A, DO   10 mg at 02/17/21 2101  . insulin aspart (novoLOG) injection 0-5 Units  0-5 Units Subcutaneous QHS Kyle, Tyrone A, DO      . insulin aspart (novoLOG) injection 0-6 Units  0-6 Units  Subcutaneous TID WC Kyle, Tyrone A, DO      . levETIRAcetam (KEPPRA) tablet 500 mg  500 mg Oral BID [place] Donnamarie Poag, DO   500 mg at 02/18/21 1116  . nitroGLYCERIN (NITROSTAT) SL tablet 0.4 mg  0.4 mg Sublingual Q5 min PRN Marylyn Ishihara, Tyrone A, DO   0.4 mg at 02/17/21 0649  . ondansetron (ZOFRAN) tablet 4 mg  4 mg Oral Q6H PRN Marylyn Ishihara, Tyrone A, DO       Or  . ondansetron (ZOFRAN) injection 4 mg  4 mg Intravenous Q6H PRN Marylyn Ishihara, Tyrone A, DO      . pravastatin (PRAVACHOL) tablet 80 mg  80 mg Oral QHS [place] Eric J, DO      . sulfamethoxazole-trimethoprim (BACTRIM DS) 800-160 MG per tablet 1 tablet  1 tablet Oral Q M,W,F [place] Eric J, DO   1 tablet at 02/18/21 1116  .  tamsulosin (FLOMAX) capsule 0.4 mg  0.4 mg Oral Daily [place] Donnamarie Poag, DO   0.4 mg at 02/18/21 1116     Discharge Medications: Please see discharge summary for a list of discharge medications.  Relevant Imaging Results:  Relevant Lab Results:   Additional Information [phone]  [place]e [place], [place]

## 2021-02-18 NOTE — Progress Notes (Signed)
[facility] Progress Note  Subjective: seen in room, no SOB today. 3.5 L off w/ HD yest afternoon.   Vitals:   02/18/21 0445 02/18/21 1117 02/18/21 1431 02/18/21 1537  BP: (!) 143/72 (!) 151/79 (!) 169/75 (!) 153/70  Pulse: 90   72  Resp: '19 17 18 18  '$ Temp: 98.3 F (36.8 C)     TempSrc: Oral     SpO2: 98%     Weight: 74.9 kg       Exam:   alert, nad   no jvd  Chest cta bilat  Cor reg no RG  Abd soft ntnd no ascites   Ext no LE edema   Alert, NF, ox3    LFA AVF+bruit    OP HD: [place] TTS  4h  67.5kg  2/2 bath  Hep 1200    - hect 2 ug IV tiw  - mircera 100 ug q2, , last dose 09/18/20 (misses HD d/t [place])  - home: coreg 12.5 bid, no binder noted   Assessment/ Plan: 1. SOB/ vol overload/ pulm edema - due to missed HD x 2. Had HD yest w/ 3.5 L off, clinically much better today. If will be here tomorrow will plan to continue to lower volume. Still up by 6-7kg.  2. ESRD - on HD TTS. HD tomorrow. Max UF.  3. HTN urgency - lower vol further, cont coreg, clon patch added here 4. DM2 5. HIV - per pmd 6. MBD ckd - cont vdra, no binder listed 7. Anemia ckd - Hb 7.8, misses esa at OP HD last given dec 2021. Got darbe 100 ug IV yest 5/29. Cont weekly while here.   8. Debility - came from SNF. PT evaluating.  9. Seizure d/o - on keppra     Richard [name] 02/18/2021, 3:56 PM   Recent Labs  Lab 02/17/21 0523 02/17/21 1140  K 4.9  --   BUN 54*  --   CREATININE 10.16* 10.37*  CALCIUM 8.2*  --   HGB 7.8*  --    Inpatient medications: . bictegravir-emtricitabine-tenofovir AF  1 tablet Oral Daily  . carvedilol  12.5 mg Oral BID WC  . Chlorhexidine Gluconate Cloth  6 each Topical Q0600  . cloNIDine  0.2 mg Transdermal Weekly  . clopidogrel  75 mg Oral Daily  . [START ON 02/26/2021] darbepoetin (ARANESP) injection - DIALYSIS  100 mcg Intravenous Q Tue-HD  . doxepin  25 mg Oral QHS   And  . doxepin  50 mg Oral QHS  . [START ON 02/19/2021] doxercalciferol  2 mcg  Intravenous Q T,Th,Sa-HD  . heparin  5,000 Units Subcutaneous Q8H  . insulin aspart  0-5 Units Subcutaneous QHS  . insulin aspart  0-6 Units Subcutaneous TID WC  . levETIRAcetam  500 mg Oral BID  . pravastatin  80 mg Oral QHS  . sulfamethoxazole-trimethoprim  1 tablet Oral Q M,W,F  . tamsulosin  0.4 mg Oral Daily    acetaminophen **OR** acetaminophen, cloNIDine, hydrALAZINE, nitroGLYCERIN, ondansetron **OR** ondansetron (ZOFRAN) IV

## 2021-02-18 NOTE — TOC Initial Note (Signed)
Transition of Care [facility]) - Initial/Assessment Note    Patient Details  Name: Richard [name] MRN: [id number] Date of Birth: 05-Dec-1955  Transition of Care [facility]) CM/SW Contact:    Coralee Pesa, [place] Phone Number: 02/18/2021, 1:44 PM  Clinical Narrative:                 CSW spoke with pt at bedside who confirmed he was from [place]. He stated he did not want to return there at all. CSW explained that we could send referral's out to other facilities, but he may need to go back to [facility] and work with their social workers to get placement.  Pt states he has been, and they have not helped him to find placement so far. We discussed insurance concerns since he has been to rehab since august, he says insurance told him he was out of days. He then noted he would like an apartment and asked CSW to help set that up. CSW stated there were no apartments to give out. He stated he can pay rent and asked that CSW call around, he noted that resources had been given to him. He was upset that CSW would not call around and find him an apartment.  Pt stated he would go ahead and find housing and would like for [facility] to come to the house when he has one.. CSW asked if he had friends or family that could assist him and he said he did. CSW will fax pt out to see if any other bed offers are received in case housing cannot be found. SW will continue to follow for DC planning.  Expected Discharge Plan: [place] Barriers to Discharge: [name] Work up,Unsafe home situation   Patient Goals and CMS Choice Patient states their goals for this hospitalization and ongoing recovery are:: Pt not agreeable to returning to SNF, would like to find housing. CMS Medicare.gov Compare Post Acute Care list provided to:: Patient    Expected Discharge Plan and Services Expected Discharge Plan: [place]       Living arrangements for the past 2 months:  [place]                                      Prior Living Arrangements/Services Living arrangements for the past 2 months: [place] Lives with:: Facility Resident Patient language and need for interpreter reviewed:: Yes Do you feel safe going back to the place where you live?: No   Pt does not want to go back to SNF, and states he no longer has an apartment,  Need for Family Participation in Patient Care: Yes (Comment) Care giver support system in place?: No (comment)   Criminal Activity/Legal Involvement Pertinent to Current Situation/Hospitalization: No - Comment as needed  Activities of Daily Living Home Assistive Devices/Equipment: Wheelchair ADL Screening (condition at time of admission) Patient's cognitive ability adequate to safely complete daily activities?: Yes Is the patient deaf or have difficulty hearing?: No Does the patient have difficulty seeing, even when wearing glasses/contacts?: No Does the patient have difficulty concentrating, remembering, or making decisions?: No Patient able to express need for assistance with ADLs?: No Does the patient have difficulty dressing or bathing?: Yes Independently performs ADLs?: No Communication: Independent Dressing (OT): Needs assistance Is this a change from baseline?: Pre-admission baseline Grooming: Needs assistance Is this a  change from baseline?: Pre-admission baseline Feeding: Needs assistance Is this a change from baseline?: Pre-admission baseline Bathing: Needs assistance Is this a change from baseline?: Pre-admission baseline Toileting: Independent with device (comment) Is this a change from baseline?: Pre-admission baseline In/Out Bed: Needs assistance Is this a change from baseline?: Pre-admission baseline Walks in Home: Needs assistance Is this a change from baseline?: Pre-admission baseline Does the patient have difficulty walking or climbing stairs?: Yes Weakness of  Legs: Right Weakness of Arms/Hands: Right  Permission Sought/Granted   Permission granted to share information with : No              Emotional Assessment Appearance:: Appears stated age Attitude/Demeanor/Rapport: Engaged Affect (typically observed): Irritable Orientation: : Oriented to Self,Oriented to Place,Oriented to  Time,Oriented to Situation Alcohol / Substance Use: Not Applicable Psych Involvement: No (comment)  Admission diagnosis:  Shortness of breath [R06.02] Hypertensive urgency [I16.0] Hypervolemia, unspecified hypervolemia type [E87.70] Patient Active Problem List   Diagnosis Date Noted  . Elevated troponin 02/18/2021  . Acute on chronic diastolic CHF (congestive heart failure) ([place]) 02/18/2021  . Depression 02/18/2021  . Hemorrhoids 01/11/2021  . Hyperkalemia 01/10/2021  . Dialysis patient, noncompliant ([place])   . 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  . Syncope 06/01/2020  . Secondary hyperparathyroidism of renal origin ([place]) 05/28/2020  . COVID-19 05/16/2020  . Hypokalemia 05/16/2020  . Allergy, unspecified, initial encounter 05/08/2020  . Anaphylactic shock, unspecified, initial encounter 05/08/2020  . Anemia in chronic kidney disease 05/08/2020  . Complication of vascular dialysis catheter 05/08/2020  . Dependence on renal dialysis ([place]) 05/08/2020  . Headache, unspecified 05/08/2020  . Iron deficiency anemia, unspecified 05/08/2020  . Cerebellar stroke syndrome 05/07/2020  . Encounter for feeding tube placement   . Weakness   . Creatinine elevation   . Goals of care, counseling/discussion   . Hypertensive urgency   . Meningoencephalitis   . Palliative care by specialist   . ESRD (end stage renal disease) ([place])   . AIDS (acquired immune deficiency syndrome) ([place]) 04/13/2020  . Encephalopathy 04/13/2020  . Seizure ([place]) 04/10/2020  . History of cerebrovascular accident (CVA) with residual deficit 03/19/2020  . CKD (chronic  kidney disease) stage 5, GFR less than 15 ml/min (HCC) 02/15/2020  . Altered mental status 02/13/2020  . Neuropathy in diabetes ([place])   . Migraines   . Hypertension   . GERD (gastroesophageal reflux disease)   . DM2 (diabetes mellitus, type 2) ([place])   . Arthritis   . Hyperlipidemia 08/25/2012  . Chronic kidney disease, stage 4 (severe) ([place]) 08/23/2012  . Tobacco abuse 08/23/2012  . Malnutrition of moderate degree ([place]) 08/23/2012  . 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

## 2021-02-18 NOTE — TOC Progression Note (Signed)
Transition of Care [facility]) - Progression Note    Patient Details  Name: Richard [name] MRN: [id number] Date of Birth: 19-Apr-1956  [facility] CM/SW [place], [place] Phone Number: 02/18/2021, 2:34 PM  Clinical Narrative:    CSW met with pt at bedside in reference to pt needing to return to [facility] [employer] [facility]. Pt states he does not want to return as no one does anything for him there. Pt states that he has requested multiple times to be sent to another facility or for assistance in finding an apartment but nothing happens. CSW advised repeatedly that pt will need to return back to [place] as pt is not able to care for himself. Pt states he doesn't want to and will find a way to leave. CSW inquired on pt refusing dialysis, pt states "God is in control and he has the final decision on my health". Pt states he doesn't like dialysis. CSW inquired on if pt were to live outside of SNF, who would pt have to support him, pt states his daughter, Richard [name] [phone] and his fiance Richard [name] (pt doesn't know her phone number). CSW spoke with pt's daughter Richard [name], she states she works 12-13 hours per day, six days a week. Richard [name] states she has tried to find placement for her father but has not been able to. Richard [name] states she is aware of pt needing 24/7 care and she is not able to provide that for her father.    Expected Discharge Plan: [place] Barriers to Discharge: [place] Work up,Unsafe home situation  Expected Discharge Plan and Services Expected Discharge Plan: [place] arrangements for the past 2 months: [place]                                       Social Determinants of Health (SDOH) Interventions    Readmission Risk Interventions Readmission Risk Prevention Plan 02/15/2020  Transportation Screening Complete  PCP  or Specialist Appt within 5-7 Days Not Complete  Not Complete comments pending disposition  Home Care Screening Complete  Medication Review (RN CM) Referral to Pharmacy  Some recent data might be hidden

## 2021-02-18 NOTE — Evaluation (Signed)
Physical Therapy Evaluation Patient Details Name: Richard [name] MRN: [id number] DOB: [birth year] Today's Date: 02/18/2021   History of Present Illness  Pt is a 66 y/o male admitted 5/29 from SNF secondary to worsening SOB likely from volume overload. Also found to have hypertensive urgency. PMH includes HTN, DM, ESRD on HD, and CVA with R sided weakness.  Clinical Impression  Pt admitted secondary to problem above with deficits below. Pt requiring min to mod A for bed mobility tasks this session. Reports normally requires 1-2 person assist at SNF for transfers to/from [facility]. Pt also voiced concerns about care he is getting at SNF and is wanting to go to another SNF. Will continue to follow acutely to maximize functional independence and safety.     Follow Up Recommendations SNF (reports he does not want to go back to [facility])    Equipment Recommendations  None recommended by PT    Recommendations for Other Services       Precautions / Restrictions Precautions Precautions: Fall Restrictions Weight Bearing Restrictions: No      Mobility  Bed Mobility Overal bed mobility: Needs Assistance Bed Mobility: Supine to Sit;Sit to Supine     Supine to sit: Min assist Sit to supine: Mod assist   General bed mobility comments: Min A for trunk assist and RLE assist. Increased time required. pt wanting to finish eating lunch so further mobility deferred. Required assist for LE assist to come to return to supine.    Transfers                    Ambulation/Gait                Stairs            Wheelchair Mobility    Modified Rankin (Stroke Patients Only)       Balance Overall balance assessment: Needs assistance Sitting-balance support: Single extremity supported Sitting balance-Leahy Scale: Poor Sitting balance - Comments: Reliant on at least 1 UE support                                     Pertinent Vitals/Pain Pain  Assessment: No/denies pain    Home Living Family/patient expects to be discharged to:: Skilled nursing facility                 Additional Comments: From [place], but reports he does not want to go back    Prior Function Level of Independence: Needs assistance   Gait / Transfers Assistance Needed: Reports he needs 1-2 person assist to transfer to [facility]. Is able to self propel WC  ADL's / Homemaking Assistance Needed: Pt reports he requires assist with ADLs        Hand Dominance        Extremity/Trunk Assessment   Upper Extremity Assessment Upper Extremity Assessment: Defer to OT evaluation    Lower Extremity Assessment Lower Extremity Assessment: RLE deficits/detail RLE Deficits / Details: Tended to keep RLE flexed at hip and knee and in external rotation. Was able to straighten knee some and experienced spasms when performing extension tasks.    Cervical / Trunk Assessment Cervical / Trunk Assessment: Normal  Communication   Communication: No difficulties  Cognition Arousal/Alertness: Awake/alert Behavior During Therapy: WFL for tasks assessed/performed Overall Cognitive Status: No family/caregiver present to determine baseline cognitive functioning  General Comments: Likely close to baseline; hx of CVA      General Comments General comments (skin integrity, edema, etc.): Pt reporting he did not want to go back to [place].    Exercises     Assessment/Plan    PT Assessment Patient needs continued PT services  PT Problem List Decreased strength;Decreased balance;Decreased mobility;Decreased knowledge of use of DME;Decreased activity tolerance;Decreased knowledge of precautions       PT Treatment Interventions DME instruction;Therapeutic activities;Functional mobility training;Balance training;Therapeutic exercise;Patient/family education;Wheelchair mobility training    PT Goals (Current goals can  be found in the Care Plan section)  Acute Rehab PT Goals Patient Stated Goal: to go somewhere other than [place] PT Goal Formulation: With patient Time For Goal Achievement: 03/04/21 Potential to Achieve Goals: Fair    Frequency Min 2X/week   Barriers to discharge        Co-evaluation               AM-PAC PT "6 Clicks" Mobility  Outcome Measure Help needed turning from your back to your side while in a flat bed without using bedrails?: A Little Help needed moving from lying on your back to sitting on the side of a flat bed without using bedrails?: A Lot Help needed moving to and from a bed to a chair (including a wheelchair)?: A Lot Help needed standing up from a chair using your arms (e.g., wheelchair or bedside chair)?: A Lot Help needed to walk in hospital room?: Total Help needed climbing 3-5 steps with a railing? : Total 6 Click Score: 11    End of Session   Activity Tolerance: Patient tolerated treatment well Patient left: in bed;with call bell/phone within reach;with bed alarm set Nurse Communication: Mobility status PT Visit Diagnosis: Unsteadiness on feet (R26.81);Muscle weakness (generalized) (M62.81);Difficulty in walking, not elsewhere classified (R26.2)    Time: 1201-1212 PT Time Calculation (min) (ACUTE ONLY): 11 min   Charges:   PT Evaluation $PT Eval Moderate Complexity: 1 Mod          [name], PT, DPT  Acute Rehabilitation Services  Pager: [phone] Office: [phone]   [name] 02/18/2021, 1:08 PM

## 2021-02-18 NOTE — Progress Notes (Addendum)
PROGRESS NOTE    [name]  [id number] DOB: 12-13-1955 DOA: 02/17/2021 PCP: [name], MD    Brief Narrative:  Richard [name] is a 65 year old male with past medical history significant for ESRD on HD, essential hypertension, DM2, HIV, seizure disorder who presented to [facility] ED with progressive shortness of breath.  No recent sick contacts.  Denies fever/cough or chest pain.  Patient with history of noncompliance with HD.  In the ED, temperature 98.5 F, HR 81, RR 21, BP 214/106, SPO2 100% on 2 L nasal cannula.  Sodium 141, potassium 4.9, chloride 110, CO2 22, BUN 54, creatinine 10.16, glucose 138.  WBC 8.0, hemoglobin 7.8, platelets 145.  BNP 93.1.  High sensitive troponin 162>208>213.  Covid-19/influenza A/B PCR negative. Chest x-ray with interstitial edema, no pleural effusion.  TRH consulted for further evaluation and management of acute hypoxic respiratory failure secondary to decompensated diastolic congestive heart failure in the setting of volume overload from noncompliance with HD outpatient.   Assessment & Plan:   Active Problems:   Hypertensive urgency   Acute hypoxic respiratory failure, POA Acute on chronic diastolic congestive heart failure Patient presenting to the ED from his SNF with progressive shortness of breath.  Patient with history of noncompliance with missed HD and signing off early on multiple occasions.  Patient hypoxic on presentation requiring 2 L nasal cannula.  Elevated BNP with chest x-ray findings of pulmonary edema.  Etiology is likely secondary to volume overload from noncompliance with HD.  TTE with LVEF 50-55%, no LV regional wall motion abnormalities, LV mildly dilated, LA severely dilated, RA moderately dilated, trivial MR, IVC dilated. --Continue HD for volume management per nephrology --Carvedilol 12.5 mg p.o. twice daily --Supplemental oxygen now titrated off.  Hypertensive urgency BP 214/106 on presentation,  likely secondary to volume overload. --BP improved to 143/72 this morning --Carvedilol 12.5 mg p.o. twice daily --Clonidine patch 0.2 mg --Monitor BP closely  Elevated troponin High sensitive troponin 304-335-6000; etiology likely secondary to type II demand ischemia in the setting of volume overload and hypertensive urgency.  EKG with no concerning dynamic changes. --Continue monitor on telemetry  Seizure disorder --Keppra 500 mg p.o. twice daily  Type 2 diabetes mellitus On Lantus 5 units obviously nightly at home. --SSI for coverage --CBG before every meal/at bedtime  HIV Follows with infectious disease, Dr. Tommy Medal outpatient. Last CD4 count 39 on 04/13/2020. --Biktarvy --Bactrim DS 800-160 mg PO M/W/F --outpatient follow-up with ID  Hx CVA --Continue statin and Plavix  HLD: Pravastatin 80 mg p.o. nightly  BPH: Tamsulosin 0.4 mg p.o. daily  Depression: Continue doxepin   DVT prophylaxis: heparin injection 5,000 Units Start: 02/17/21 1400    Code Status: Full Code Family Communication: No family present at bedside this morning.  Disposition Plan:  Level of care: Telemetry Medical Status is: Inpatient  Remains inpatient appropriate because:Unsafe d/c plan and Inpatient level of care appropriate due to severity of illness   Dispo: The patient is from: SNF              Anticipated d/c is to: SNF              Patient currently is not medically stable to d/c.   Difficult to place patient No    Consultants:   Nephrology  Procedures:   none  Antimicrobials:   none   Subjective: Patient seen examined bedside, resting comfortably.  Underwent HD yesterday.  Titrated off of supple oxygen.  Dyspnea much  improved today.  States does not want to return to [place] and requesting to speak with social work this morning.  No other questions or concerns at this time.  Denies headache, no fever/chills/night sweats, no nausea cefonicid diarrhea, no chest pain, no  palpitations, no abdominal pain.  No acute events overnight per nursing.  Objective: Vitals:   02/17/21 2038 02/17/21 2345 02/18/21 0445 02/18/21 1117  BP: (!) 188/82 (!) 143/64 (!) 143/72 (!) 151/79  Pulse: 92 95 90   Resp: '20 19 19 17  '$ Temp: 98.5 F (36.9 C)  98.3 F (36.8 C)   TempSrc: Oral  Oral   SpO2: 100%  98%   Weight:   74.9 kg     Intake/Output Summary (Last 24 hours) at 02/18/2021 1229 Last data filed at 02/18/2021 0300 Gross per 24 hour  Intake 236 ml  Output 3650 ml  Net -3414 ml   Filed Weights   02/17/21 1456 02/17/21 1826 02/18/21 0445  Weight: 75.9 kg 72.5 kg 74.9 kg    Examination:  General exam: Appears calm and comfortable  Respiratory system: Clear to auscultation. Respiratory effort normal.  On room air Cardiovascular system: S1 & S2 heard, RRR. No JVD, murmurs, rubs, gallops or clicks. No pedal edema. Gastrointestinal system: Abdomen is nondistended, soft and nontender. No organomegaly or masses felt. Normal bowel sounds heard. Central nervous system: Alert and oriented.  Right hemiparesis, otherwise no focal neurological deficits. Extremities: Noted right hemiparesis, chronic Skin: No rashes, lesions or ulcers Psychiatry: Judgement and insight appear normal. Mood & affect appropriate.     Data Reviewed: I have personally reviewed following labs and imaging studies  CBC: Recent Labs  Lab 02/17/21 0523  WBC 8.0  NEUTROABS 5.8  HGB 7.8*  HCT 25.3*  MCV 100.4*  PLT [id number]*   Basic Metabolic Panel: Recent Labs  Lab 02/17/21 0523 02/17/21 1140  NA 141  --   K 4.9  --   CL 110  --   CO2 22  --   GLUCOSE 138*  --   BUN 54*  --   CREATININE 10.16* 10.37*  CALCIUM 8.2*  --    GFR: CrCl cannot be calculated (Unknown ideal weight.). Liver Function Tests: No results for input(s): AST, ALT, ALKPHOS, BILITOT, PROT, ALBUMIN in the last 168 hours. No results for input(s): LIPASE, AMYLASE in the last 168 hours. No results for input(s): AMMONIA  in the last 168 hours. Coagulation Profile: No results for input(s): INR, PROTIME in the last 168 hours. Cardiac Enzymes: No results for input(s): CKTOTAL, CKMB, CKMBINDEX, TROPONINI in the last 168 hours. BNP (last 3 results) No results for input(s): PROBNP in the last 8760 hours. HbA1C: No results for input(s): HGBA1C in the last 72 hours. CBG: Recent Labs  Lab 02/17/21 1126 02/17/21 1403 02/17/21 2108 02/18/21 0750 02/18/21 1123  GLUCAP 78 149* 114* 90 129*   Lipid Profile: No results for input(s): CHOL, HDL, LDLCALC, TRIG, CHOLHDL, LDLDIRECT in the last 72 hours. Thyroid Function Tests: No results for input(s): TSH, T4TOTAL, FREET4, T3FREE, THYROIDAB in the last 72 hours. Anemia Panel: No results for input(s): VITAMINB12, FOLATE, FERRITIN, TIBC, IRON, RETICCTPCT in the last 72 hours. Sepsis Labs: No results for input(s): PROCALCITON, LATICACIDVEN in the last 168 hours.  Recent Results (from the past 240 hour(s))  Resp Panel by RT-PCR (Flu A&B, Covid) Nasopharyngeal Swab     Status: None   Collection Time: 02/17/21  6:59 AM   Specimen: Nasopharyngeal Swab; Nasopharyngeal(NP) swabs in vial  transport medium  Result Value Ref Range Status   SARS Coronavirus 2 by RT PCR NEGATIVE NEGATIVE Final    Comment: (NOTE) SARS-CoV-2 target nucleic acids are NOT DETECTED.  The SARS-CoV-2 RNA is generally detectable in upper respiratory specimens during the acute phase of infection. The lowest concentration of SARS-CoV-2 viral copies this assay can detect is 138 copies/mL. A negative result does not preclude SARS-Cov-2 infection and should not be used as the sole basis for treatment or other patient management decisions. A negative result may occur with  improper specimen collection/handling, submission of specimen other than nasopharyngeal swab, presence of viral mutation(s) within the areas targeted by this assay, and inadequate number of viral copies(<138 copies/mL). A negative  result must be combined with clinical observations, patient history, and epidemiological information. The expected result is Negative.  Fact Sheet for Patients:  EntrepreneurPulse.com.au  Fact Sheet for Healthcare Providers:  IncredibleEmployment.be  This test is no t yet approved or cleared by the [place] FDA and  has been authorized for detection and/or diagnosis of SARS-CoV-2 by FDA under an Emergency Use Authorization (EUA). This EUA will remain  in effect (meaning this test can be used) for the duration of the COVID-19 declaration under Section 564(b)(1) of the Act, 21 U.S.C.section 360bbb-3(b)(1), unless the authorization is terminated  or revoked sooner.       Influenza A by PCR NEGATIVE NEGATIVE Final   Influenza B by PCR NEGATIVE NEGATIVE Final    Comment: (NOTE) The Xpert Xpress SARS-CoV-2/FLU/RSV plus assay is intended as an aid in the diagnosis of influenza from Nasopharyngeal swab specimens and should not be used as a sole basis for treatment. Nasal washings and aspirates are unacceptable for Xpert Xpress SARS-CoV-2/FLU/RSV testing.  Fact Sheet for Patients: EntrepreneurPulse.com.au  Fact Sheet for Healthcare Providers: IncredibleEmployment.be  This test is not yet approved or cleared by the [place] FDA and has been authorized for detection and/or diagnosis of SARS-CoV-2 by FDA under an Emergency Use Authorization (EUA). This EUA will remain in effect (meaning this test can be used) for the duration of the COVID-19 declaration under Section 564(b)(1) of the Act, 21 U.S.C. section 360bbb-3(b)(1), unless the authorization is terminated or revoked.  Performed at [facility], [place] [address], [place], [place] [address]          Radiology Studies: DG Chest 2 View  Result Date: 02/17/2021 CLINICAL DATA:  Shortness of breath EXAM: CHEST - 2 VIEW COMPARISON:   01/10/2021 FINDINGS: Increased interstitial markings, suggesting mild interstitial edema. However, there are no pleural effusions. No pneumothorax. Heart is top-normal in size. Prior right IJ dual lumen dialysis catheter has been removed. IMPRESSION: Suspected mild interstitial edema.  No pleural effusions. Electronically Signed   By: Julian Hy M.D.   On: 02/17/2021 05:18   ECHOCARDIOGRAM COMPLETE  Result Date: 02/17/2021    ECHOCARDIOGRAM REPORT   Patient Name:   Richard [name] Date of Exam: 02/17/2021 Medical Rec #:  [id number]             Height:       72.0 in Accession #:    [id number]            Weight:       148.4 lb Date of Birth:  02/28/1956             BSA:          1.877 m Patient Age:    67 years  BP:           89/48 mmHg Patient Gender: M                     HR:           82 bpm. Exam Location:  Inpatient Procedure: 2D Echo, Cardiac Doppler and Color Doppler Indications:    CHF-Acute Systolic [id number]  History:        Patient has prior history of Echocardiogram examinations, most                 recent 12/05/2019. Risk Factors:Hypertension, Diabetes and                 Dyslipidemia. AIDS (acquired immune deficiency syndrome) ([place]),                 COVID-19, ESRD (end stage renal disease), Dialysis patient,                 noncompliant.  Sonographer:    Alvino Chapel RCS Referring Phys: [id number] [place]  1. Left ventricular ejection fraction, by estimation, is 50 to 55%. The left ventricle has low normal function. The left ventricle has no regional wall motion abnormalities. The left ventricular internal cavity size was mildly dilated. There is moderate  concentric left ventricular hypertrophy. Left ventricular diastolic parameters are consistent with Grade II diastolic dysfunction (pseudonormalization). Elevated left ventricular end-diastolic pressure.  2. Right ventricular systolic function is normal. The right ventricular size is normal. Tricuspid  regurgitation signal is inadequate for assessing PA pressure.  3. Left atrial size was severely dilated.  4. Right atrial size was moderately dilated.  5. The mitral valve is normal in structure. Trivial mitral valve regurgitation.  6. The aortic valve is tricuspid. There is mild calcification of the aortic valve. There is mild thickening of the aortic valve. Aortic valve regurgitation is not visualized. Mild aortic valve sclerosis is present, with no evidence of aortic valve stenosis.  7. The inferior vena cava is dilated in size with >50% respiratory variability, suggesting right atrial pressure of 8 mmHg. Comparison(s): No significant change from prior study. Prior images reviewed side by side. FINDINGS  Left Ventricle: Left ventricular ejection fraction, by estimation, is 50 to 55%. The left ventricle has low normal function. The left ventricle has no regional wall motion abnormalities. The left ventricular internal cavity size was mildly dilated. There is moderate concentric left ventricular hypertrophy. Left ventricular diastolic parameters are consistent with Grade II diastolic dysfunction (pseudonormalization). Elevated left ventricular end-diastolic pressure. Right Ventricle: The right ventricular size is normal. No increase in right ventricular wall thickness. Right ventricular systolic function is normal. Tricuspid regurgitation signal is inadequate for assessing PA pressure. Left Atrium: Left atrial size was severely dilated. Right Atrium: Right atrial size was moderately dilated. Pericardium: Trivial pericardial effusion is present. Mitral Valve: The mitral valve is normal in structure. There is mild thickening of the mitral valve leaflet(s). There is mild calcification of the mitral valve leaflet(s). Trivial mitral valve regurgitation. Tricuspid Valve: The tricuspid valve is normal in structure. Tricuspid valve regurgitation is mild. Aortic Valve: The aortic valve is tricuspid. There is mild  calcification of the aortic valve. There is mild thickening of the aortic valve. Aortic valve regurgitation is not visualized. Mild aortic valve sclerosis is present, with no evidence of aortic valve stenosis. Pulmonic Valve: The pulmonic valve was not well visualized. Pulmonic valve regurgitation is not visualized. Aorta: The aortic root and  ascending aorta are structurally normal, with no evidence of dilitation. Venous: The inferior vena cava is dilated in size with greater than 50% respiratory variability, suggesting right atrial pressure of 8 mmHg. IAS/Shunts: The atrial septum is grossly normal.  LEFT VENTRICLE PLAX 2D LVIDd:         5.20 cm  Diastology LVIDs:         3.90 cm  LV e' medial:    5.33 cm/s LV PW:         1.70 cm  LV E/e' medial:  22.0 LV IVS:        1.50 cm  LV e' lateral:   5.87 cm/s LVOT diam:     2.10 cm  LV E/e' lateral: 19.9 LV SV:         76 LV SV Index:   40 LVOT Area:     3.46 cm  RIGHT VENTRICLE RV S prime:     13.20 cm/s TAPSE (M-mode): 2.6 cm LEFT ATRIUM              Index       RIGHT ATRIUM           Index LA diam:        4.10 cm  2.18 cm/m  RA Area:     21.00 cm LA Vol (A2C):   168.0 ml 89.52 ml/m RA Volume:   61.00 ml  32.51 ml/m LA Vol (A4C):   128.0 ml 68.21 ml/m LA Biplane Vol: 149.0 ml 79.40 ml/m  AORTIC VALVE LVOT Vmax:   99.00 cm/s LVOT Vmean:  65.000 cm/s LVOT VTI:    0.218 m  AORTA Ao Root diam: 3.50 cm MITRAL VALVE MV Area (PHT): 3.50 cm     SHUNTS MV Decel Time: 217 msec     Systemic VTI:  0.22 m MV E velocity: 117.00 cm/s  Systemic Diam: 2.10 cm MV A velocity: 93.60 cm/s MV E/A ratio:  1.25 Buford Dresser MD Electronically signed by Buford Dresser MD Signature Date/Time: 02/17/2021/5:35:26 PM    Final         Scheduled Meds: . bictegravir-emtricitabine-tenofovir AF  1 tablet Oral Daily  . carvedilol  12.5 mg Oral BID WC  . Chlorhexidine Gluconate Cloth  6 each Topical Q0600  . cloNIDine  0.2 mg Transdermal Weekly  . clopidogrel  75 mg Oral  Daily  . [START ON 02/26/2021] darbepoetin (ARANESP) injection - DIALYSIS  100 mcg Intravenous Q Tue-HD  . doxepin  75 mg Oral QHS  . [START ON 02/19/2021] doxercalciferol  2 mcg Intravenous Q T,Th,Sa-HD  . heparin  5,000 Units Subcutaneous Q8H  . insulin aspart  0-5 Units Subcutaneous QHS  . insulin aspart  0-6 Units Subcutaneous TID WC  . levETIRAcetam  500 mg Oral BID  . pravastatin  80 mg Oral QHS  . sulfamethoxazole-trimethoprim  1 tablet Oral Q M,W,F  . tamsulosin  0.4 mg Oral Daily   Continuous Infusions:   LOS: 1 day    Time spent: 42 minutes spent on chart review, discussion with nursing staff, consultants, updating family and interview/physical exam; more than 50% of that time was spent in counseling and/or coordination of care.    Mattilyn Crites J [place] DO [facility] Available via Epic secure chat 7am-7pm After these hours, please refer to coverage provider listed on [email or web address] 02/18/2021, 12:29 PM

## 2021-02-19 DIAGNOSIS — Z992 Dependence on renal dialysis: Secondary | ICD-10-CM

## 2021-02-19 DIAGNOSIS — N186 End stage renal disease: Secondary | ICD-10-CM

## 2021-02-19 DIAGNOSIS — Z794 Long term (current) use of insulin: Secondary | ICD-10-CM

## 2021-02-19 DIAGNOSIS — I5033 Acute on chronic diastolic (congestive) heart failure: Secondary | ICD-10-CM | POA: Diagnosis not present

## 2021-02-19 DIAGNOSIS — E1122 Type 2 diabetes mellitus with diabetic chronic kidney disease: Secondary | ICD-10-CM | POA: Diagnosis not present

## 2021-02-19 LAB — RENAL FUNCTION PANEL
Albumin: 2.3 g/dL — ABNORMAL LOW (ref 3.5–5.0)
Anion gap: 11 (ref 5–15)
BUN: 34 mg/dL — ABNORMAL HIGH (ref 8–23)
CO2: 25 mmol/L (ref 22–32)
Calcium: 7.8 mg/dL — ABNORMAL LOW (ref 8.9–10.3)
Chloride: 102 mmol/L (ref 98–111)
Creatinine, Ser: 7.27 mg/dL — ABNORMAL HIGH (ref 0.61–1.24)
GFR, Estimated: 8 mL/min — ABNORMAL LOW (ref >60)
Glucose, Bld: 89 mg/dL (ref 70–99)
Phosphorus: 4.4 mg/dL (ref 2.5–4.6)
Potassium: 4.2 mmol/L (ref 3.5–5.1)
Sodium: 138 mmol/L (ref 135–145)

## 2021-02-19 LAB — HEMOGLOBIN AND HEMATOCRIT, BLOOD
HCT: 27.9 % — ABNORMAL LOW (ref 39.0–52.0)
Hemoglobin: 9.1 g/dL — ABNORMAL LOW (ref 13.0–17.0)

## 2021-02-19 LAB — GLUCOSE, CAPILLARY
Glucose-Capillary: 121 mg/dL — ABNORMAL HIGH (ref 70–99)
Glucose-Capillary: 166 mg/dL — ABNORMAL HIGH (ref 70–99)
Glucose-Capillary: 132 mg/dL — ABNORMAL HIGH (ref 70–99)

## 2021-02-19 LAB — CBC
HCT: 22 % — ABNORMAL LOW (ref 39.0–52.0)
Hemoglobin: 6.9 g/dL — CL (ref 13.0–17.0)
MCH: 31.7 pg (ref 26.0–34.0)
MCHC: 31.4 g/dL (ref 30.0–36.0)
MCV: 100.9 fL — ABNORMAL HIGH (ref 80.0–100.0)
Platelets: 155 10*3/uL (ref 150–400)
RBC: 2.18 MIL/uL — ABNORMAL LOW (ref 4.22–5.81)
RDW: 15.5 % (ref 11.5–15.5)
WBC: 2.6 10*3/uL — ABNORMAL LOW (ref 4.0–10.5)
nRBC: 0 % (ref 0.0–0.2)

## 2021-02-19 LAB — PREPARE RBC (CROSSMATCH)

## 2021-02-19 MED ORDER — SODIUM CHLORIDE 0.9% IV SOLUTION: Freq: Once | INTRAVENOUS | Status: DC

## 2021-02-19 MED ORDER — HEPARIN SODIUM (PORCINE) 1000 UNIT/ML DIALYSIS: 1200.0000 [IU] | Freq: Once | INTRAMUSCULAR | Status: DC

## 2021-02-19 MED ORDER — DOXERCALCIFEROL 4 MCG/2ML IV SOLN
INTRAVENOUS | Status: AC
  Administered 2021-02-19: 2 ug via INTRAVENOUS
  Filled 2021-02-19: qty 2

## 2021-02-19 NOTE — TOC Progression Note (Signed)
Transition of Care [facility]) - Progression Note    Patient Details  Name: Havok Gellerman MRN: [id number] Date of Birth: [birth year]  Transition of Care [facility]) CM/SW [place], [place] Phone Number: 02/19/2021, 4:03 PM  Clinical Narrative:      CSW spoke with patient at bedside. Patient confirmed dc plan to return to [place] when medically ready. Patient confirmed he will follow up with [facility] once returned to look into possibly another long term facility. CSW will start insurance authorization close to patient being medically ready. CSW will continue to follow and assist with discharge  planning needs.   Expected Discharge Plan: [place] Barriers to Discharge: [place] Work up,Unsafe home situation  Expected Discharge Plan and Services Expected Discharge Plan: [place] arrangements for the past 2 months: [place]                                       Social Determinants of Health (SDOH) Interventions    Readmission Risk Interventions Readmission Risk Prevention Plan 02/15/2020  Transportation Screening Complete  PCP or Specialist Appt within 5-7 Days Not Complete  Not Complete comments pending disposition  Home Care Screening Complete  Medication Review (RN CM) Referral to Pharmacy  Some recent data might be hidden

## 2021-02-19 NOTE — Progress Notes (Signed)
Hemodialysis- Transfusion order received per Shelia Media PA for hgb 6.9. Type and screen sent. Blood bank to notify when ready. Patient consent obtained with PA at bedside in HD unit.

## 2021-02-19 NOTE — Progress Notes (Signed)
[facility] Progress Note   Subjective:   Pt seen on HD. Hgb dropped to 6.9. Pt denies any known blood loss. Discussed risks vs. Benefits of blood transfusion and pt agrees to 1 unit PRBC today. He denies SOB, CP, palpitations, dizziness, abdominal pain and nausea. Tolerating HD well so far today.   Objective Vitals:   02/19/21 0717 02/19/21 0730 02/19/21 0800 02/19/21 0830  BP: (!) 160/83 (!) 172/89 136/78 135/73  Pulse:      Resp: 16 15    Temp:      TempSrc:      SpO2:      Weight:       Physical Exam General: Well developed male, alert and in NAD Heart: RRR, no murmurs, rubs or gallops Lungs: CTA bilaterally without wheezing, rhonchi or rales Abdomen: Soft, non-tender, non-distended, +BS Extremities: No edema b/l lower extremities Dialysis Access: LUE AVF accessed   Additional Objective Labs: Basic Metabolic Panel: Recent Labs  Lab 02/17/21 0523 02/17/21 1140 02/19/21 0627  NA 141  --  138  K 4.9  --  4.2  CL 110  --  102  CO2 22  --  25  GLUCOSE 138*  --  89  BUN 54*  --  34*  CREATININE 10.16* 10.37* 7.27*  CALCIUM 8.2*  --  7.8*  PHOS  --   --  4.4   Liver Function Tests: Recent Labs  Lab 02/19/21 0627  ALBUMIN 2.3*   CBC: Recent Labs  Lab 02/17/21 0523 02/19/21 0627  WBC 8.0 2.6*  NEUTROABS 5.8  --   HGB 7.8* 6.9*  HCT 25.3* 22.0*  MCV 100.4* 100.9*  PLT 145* 155   Blood Culture    Component Value Date/Time   SDES CSF 04/14/2020 1827   SPECREQUEST NONE 04/14/2020 1827   CULT  04/14/2020 1827    NO GROWTH 3 DAYS Performed at [facility] [address] [place], [place] [address]    REPTSTATUS 04/18/2020 FINAL 04/14/2020 1827   CBG: Recent Labs  Lab 02/17/21 2108 02/18/21 0750 02/18/21 1123 02/18/21 1534 02/18/21 2117  GLUCAP 114* 90 129* 172* 156*    Studies/Results: ECHOCARDIOGRAM COMPLETE  Result Date: 02/17/2021    ECHOCARDIOGRAM REPORT   Patient Name:   [facility] Arlen Date of Exam:  02/17/2021 Medical Rec #:  [id number]             Height:       72.0 in Accession #:    [id number]            Weight:       148.4 lb Date of Birth:  Jun 01, 1956             BSA:          1.877 m Patient Age:    65 years              BP:           89/48 mmHg Patient Gender: M                     HR:           82 bpm. Exam Location:  Inpatient Procedure: 2D Echo, Cardiac Doppler and Color Doppler Indications:    CHF-Acute Systolic [id number]  History:        Patient has prior history of Echocardiogram examinations, most                 recent  12/05/2019. Risk Factors:Hypertension, Diabetes and                 Dyslipidemia. AIDS (acquired immune deficiency syndrome) ([place]),                 COVID-19, ESRD (end stage renal disease), Dialysis patient,                 noncompliant.  Sonographer:    Alvino Chapel RCS Referring Phys: [id number] [place]  1. Left ventricular ejection fraction, by estimation, is 50 to 55%. The left ventricle has low normal function. The left ventricle has no regional wall motion abnormalities. The left ventricular internal cavity size was mildly dilated. There is moderate  concentric left ventricular hypertrophy. Left ventricular diastolic parameters are consistent with Grade II diastolic dysfunction (pseudonormalization). Elevated left ventricular end-diastolic pressure.  2. Right ventricular systolic function is normal. The right ventricular size is normal. Tricuspid regurgitation signal is inadequate for assessing PA pressure.  3. Left atrial size was severely dilated.  4. Right atrial size was moderately dilated.  5. The mitral valve is normal in structure. Trivial mitral valve regurgitation.  6. The aortic valve is tricuspid. There is mild calcification of the aortic valve. There is mild thickening of the aortic valve. Aortic valve regurgitation is not visualized. Mild aortic valve sclerosis is present, with no evidence of aortic valve stenosis.  7. The inferior vena cava is dilated  in size with >50% respiratory variability, suggesting right atrial pressure of 8 mmHg. Comparison(s): No significant change from prior study. Prior images reviewed side by side. FINDINGS  Left Ventricle: Left ventricular ejection fraction, by estimation, is 50 to 55%. The left ventricle has low normal function. The left ventricle has no regional wall motion abnormalities. The left ventricular internal cavity size was mildly dilated. There is moderate concentric left ventricular hypertrophy. Left ventricular diastolic parameters are consistent with Grade II diastolic dysfunction (pseudonormalization). Elevated left ventricular end-diastolic pressure. Right Ventricle: The right ventricular size is normal. No increase in right ventricular wall thickness. Right ventricular systolic function is normal. Tricuspid regurgitation signal is inadequate for assessing PA pressure. Left Atrium: Left atrial size was severely dilated. Right Atrium: Right atrial size was moderately dilated. Pericardium: Trivial pericardial effusion is present. Mitral Valve: The mitral valve is normal in structure. There is mild thickening of the mitral valve leaflet(s). There is mild calcification of the mitral valve leaflet(s). Trivial mitral valve regurgitation. Tricuspid Valve: The tricuspid valve is normal in structure. Tricuspid valve regurgitation is mild. Aortic Valve: The aortic valve is tricuspid. There is mild calcification of the aortic valve. There is mild thickening of the aortic valve. Aortic valve regurgitation is not visualized. Mild aortic valve sclerosis is present, with no evidence of aortic valve stenosis. Pulmonic Valve: The pulmonic valve was not well visualized. Pulmonic valve regurgitation is not visualized. Aorta: The aortic root and ascending aorta are structurally normal, with no evidence of dilitation. Venous: The inferior vena cava is dilated in size with greater than 50% respiratory variability, suggesting right atrial  pressure of 8 mmHg. IAS/Shunts: The atrial septum is grossly normal.  LEFT VENTRICLE PLAX 2D LVIDd:         5.20 cm  Diastology LVIDs:         3.90 cm  LV e' medial:    5.33 cm/s LV PW:         1.70 cm  LV E/e' medial:  22.0 LV IVS:  1.50 cm  LV e' lateral:   5.87 cm/s LVOT diam:     2.10 cm  LV E/e' lateral: 19.9 LV SV:         76 LV SV Index:   40 LVOT Area:     3.46 cm  RIGHT VENTRICLE RV S prime:     13.20 cm/s TAPSE (M-mode): 2.6 cm LEFT ATRIUM              Index       RIGHT ATRIUM           Index LA diam:        4.10 cm  2.18 cm/m  RA Area:     21.00 cm LA Vol (A2C):   168.0 ml 89.52 ml/m RA Volume:   61.00 ml  32.51 ml/m LA Vol (A4C):   128.0 ml 68.21 ml/m LA Biplane Vol: 149.0 ml 79.40 ml/m  AORTIC VALVE LVOT Vmax:   99.00 cm/s LVOT Vmean:  65.000 cm/s LVOT VTI:    0.218 m  AORTA Ao Root diam: 3.50 cm MITRAL VALVE MV Area (PHT): 3.50 cm     SHUNTS MV Decel Time: 217 msec     Systemic VTI:  0.22 m MV E velocity: 117.00 cm/s  Systemic Diam: 2.10 cm MV A velocity: 93.60 cm/s MV E/A ratio:  1.25 Buford Dresser MD Electronically signed by Buford Dresser MD Signature Date/Time: 02/17/2021/5:35:26 PM    Final    Medications:  . sodium chloride   Intravenous Once  . bictegravir-emtricitabine-tenofovir AF  1 tablet Oral Daily  . carvedilol  12.5 mg Oral BID WC  . Chlorhexidine Gluconate Cloth  6 each Topical Q0600  . cloNIDine  0.2 mg Transdermal Weekly  . clopidogrel  75 mg Oral Daily  . [START ON 02/26/2021] darbepoetin (ARANESP) injection - DIALYSIS  100 mcg Intravenous Q Tue-HD  . doxepin  25 mg Oral QHS   And  . doxepin  50 mg Oral QHS  . doxercalciferol  2 mcg Intravenous Q T,Th,Sa-HD  . [START ON 02/20/2021] heparin  1,200 Units Dialysis Once in dialysis  . heparin  5,000 Units Subcutaneous Q8H  . insulin aspart  0-5 Units Subcutaneous QHS  . insulin aspart  0-6 Units Subcutaneous TID WC  . levETIRAcetam  500 mg Oral BID  . pravastatin  80 mg Oral QHS  .  sulfamethoxazole-trimethoprim  1 tablet Oral Q M,W,F  . tamsulosin  0.4 mg Oral Daily    OP HD: [facility]  4h  67.5kg  2/2 bath  Hep 1200    - hect 2 ug IV tiw  - mircera 100 ug q2, , last dose 09/18/20 (misses HD d/t )  - home: coreg 12.5 bid, no binder noted  Assessment/Plan: 1. SOB/ vol overload/ pulm edema - due to missed HD x 2. Had HD w/ 3.5 L off, clinically much better today. UFG 4L today and tolerating well, continue to challenge UF goals especially since pt is getting PRBC today.  2. ESRD - on HD TTS. HD today as above.  3. HTN urgency - lower vol further, cont coreg, clon patch added here 4. DM2- per primary team 5. HIV - per primary team 6. MBD ckd -Calcium and phos controlled. Continue vdra, no binder listed 7. Anemia ckd - Hb 7.8 > 6.9, misses esa at OP HD. Got darbe 100 ug IV yest 5/29. Cont weekly while here.  Consents to 1 unit PRBC today.  8. Debility - came from SNF. PT  evaluating.  9. Seizure d/o - on keppra   Anice Paganini, PA-C 02/19/2021, 8:47 AM  [facility] Pager: [phone]

## 2021-02-19 NOTE — Plan of Care (Signed)
  Problem: Education: Goal: Knowledge of General Education information will improve Description: Including pain rating scale, medication(s)/side effects and non-pharmacologic comfort measures 02/19/2021 1200 by Camillia Herter, RN Outcome: Not Progressing 02/19/2021 0804 by Camillia Herter, RN Outcome: Progressing   Problem: Health Behavior/Discharge Planning: Goal: Ability to manage health-related needs will improve 02/19/2021 1200 by Camillia Herter, RN Outcome: Not Progressing 02/19/2021 0804 by Camillia Herter, RN Outcome: Progressing   Problem: Clinical Measurements: Goal: Ability to maintain clinical measurements within normal limits will improve 02/19/2021 1200 by Camillia Herter, RN Outcome: Not Progressing 02/19/2021 0804 by Camillia Herter, RN Outcome: Progressing Goal: Diagnostic test results will improve 02/19/2021 1200 by Camillia Herter, RN Outcome: Not Progressing 02/19/2021 0804 by Camillia Herter, RN Outcome: Progressing Goal: Cardiovascular complication will be avoided 02/19/2021 1200 by Camillia Herter, RN Outcome: Not Progressing 02/19/2021 0804 by Camillia Herter, RN Outcome: Progressing   Problem: Activity: Goal: Risk for activity intolerance will decrease 02/19/2021 1200 by Camillia Herter, RN Outcome: Not Progressing 02/19/2021 0804 by Camillia Herter, RN Outcome: Progressing   Problem: Elimination: Goal: Will not experience complications related to bowel motility 02/19/2021 1200 by Camillia Herter, RN Outcome: Not Progressing 02/19/2021 0804 by Camillia Herter, RN Outcome: Progressing Goal: Will not experience complications related to urinary retention 02/19/2021 1200 by Camillia Herter, RN Outcome: Not Progressing 02/19/2021 0804 by Camillia Herter, RN Outcome: Progressing   Problem: Safety: Goal: Ability to remain free from injury will improve 02/19/2021 1200 by Camillia Herter, RN Outcome: Not Progressing 02/19/2021 0804 by Camillia Herter, RN Outcome: Progressing    Problem: Skin Integrity: Goal: Risk for impaired skin integrity will decrease 02/19/2021 1200 by Camillia Herter, RN Outcome: Not Progressing 02/19/2021 0804 by Camillia Herter, RN Outcome: Progressing   Problem: Education: Goal: Ability to demonstrate management of disease process will improve 02/19/2021 1200 by Camillia Herter, RN Outcome: Not Progressing 02/19/2021 0804 by Camillia Herter, RN Outcome: Progressing Goal: Ability to verbalize understanding of medication therapies will improve 02/19/2021 1200 by Camillia Herter, RN Outcome: Not Progressing 02/19/2021 0804 by Camillia Herter, RN Outcome: Progressing Goal: Individualized Educational Video(s) 02/19/2021 1200 by Camillia Herter, RN Outcome: Not Progressing 02/19/2021 0804 by Camillia Herter, RN Outcome: Progressing   Problem: Activity: Goal: Capacity to carry out activities will improve 02/19/2021 1200 by Camillia Herter, RN Outcome: Not Progressing 02/19/2021 0804 by Camillia Herter, RN Outcome: Progressing   Problem: Cardiac: Goal: Ability to achieve and maintain adequate cardiopulmonary perfusion will improve 02/19/2021 1200 by Camillia Herter, RN Outcome: Not Progressing 02/19/2021 0804 by Camillia Herter, RN Outcome: Progressing

## 2021-02-19 NOTE — Evaluation (Signed)
Occupational Therapy Evaluation Patient Details Name: Richard [name] MRN: [id number] DOB: 1955/10/17 Today's Date: 02/19/2021    History of Present Illness Pt is a 65 y/o male admitted 5/29 from SNF secondary to worsening SOB likely from volume overload. Also found to have hypertensive urgency. PMH includes HTN, DM, ESRD on HD, and CVA with R sided weakness.   Clinical Impression   Pt PTA: [facility] and has previous CVA affecting R side. Pt currently, sitting EOB for light ADL tasks and would need assist for dynamic sitting balance as pt would fall backwards and to L. Pt limited by decreased strength, decreased ability to care for self and decreased safety awareness. No  Chest pain reported. Pt modA overall for ADL and mobility minA to EOB.  Pt would benefit from continued OT skilled services for ADL, mobility and safety. OT following acutely.     Follow Up Recommendations  SNF    Equipment Recommendations  None recommended by OT    Recommendations for Other Services       Precautions / Restrictions Precautions Precautions: Fall Restrictions Weight Bearing Restrictions: No      Mobility Bed Mobility Overal bed mobility: Needs Assistance Bed Mobility: Supine to Sit;Sit to Supine     Supine to sit: Min assist Sit to supine: Min assist   General bed mobility comments: Min A for trunk assist as pt moving at hips for BLEs; minA to scoot hips to EOB    Transfers Overall transfer level: Needs assistance Equipment used: 1 person hand held assist Transfers: Lateral/Scoot Transfers          Lateral/Scoot Transfers: Mod assist      Balance Overall balance assessment: Needs assistance Sitting-balance support: Single extremity supported Sitting balance-Leahy Scale: Poor Sitting balance - Comments: Reliant on at least 1 UE support, scooting on L hip attempting to scoot towards R side to [facility]                                   ADL  either performed or assessed with clinical judgement   ADL Overall ADL's : Needs assistance/impaired Eating/Feeding: Set up;Sitting   Grooming: Min guard;Wash/dry hands;Wash/dry face;Oral care;Sitting Grooming Details (indicate cue type and reason): assist to stay upright at times as pt would try to reposition and would fall backwards and to the L Upper Body Bathing: Minimal assistance;Sitting   Lower Body Bathing: Moderate assistance;Sitting/lateral leans;Bed level   Upper Body Dressing : Minimal assistance;Sitting   Lower Body Dressing: Moderate assistance;Sitting/lateral leans     Toilet Transfer Details (indicate cue type and reason): unable to rest; pt scooting laterally in bed Toileting- Clothing Manipulation and Hygiene: Moderate assistance;Sitting/lateral lean       Functional mobility during ADLs: Moderate assistance General ADL Comments: Pt sitting EOB for light ADL tasks and would need assist for dynamic sitting balance as pt would fall backwards and to L. Pt limited by decreased strength, decreased ability to care for self and decreased safety awareness.     Vision Baseline Vision/History: No visual deficits Patient Visual Report: No change from baseline Vision Assessment?: No apparent visual deficits     Perception     Praxis      Pertinent Vitals/Pain Pain Assessment: No/denies pain     Hand Dominance Left   Extremity/Trunk Assessment Upper Extremity Assessment Upper Extremity Assessment: Generalized weakness;RUE deficits/detail RUE Deficits / Details: s/p previous CVAl wrist flexed,  hand in fist, 0-100 elbowe flex/ext; shoulder flex to 80*   Lower Extremity Assessment Lower Extremity Assessment: Generalized weakness;RLE deficits/detail RLE Deficits / Details: flexor/extensor tone with mobility   Cervical / Trunk Assessment Cervical / Trunk Assessment: Normal   Communication Communication Communication: No difficulties   Cognition  Arousal/Alertness: Awake/alert Behavior During Therapy: WFL for tasks assessed/performed Overall Cognitive Status: No family/caregiver present to determine baseline cognitive functioning                                 General Comments: Likely close to baseline; hx of CVA   General Comments  Pt stating "I don't want to go back to [place]." [place] describing benefits and possibility of returning home if enough progress is made    Exercises     Shoulder Instructions      Home Living Family/patient expects to be discharged to:: Skilled nursing facility                                 Additional Comments: From [place], but reports he does not want to go back      Prior Functioning/Environment Level of Independence: Needs assistance  Gait / Transfers Assistance Needed: Reports he needs 1-2 person assist to transfer to [facility]. Is able to self propel WC ADL's / Homemaking Assistance Needed: Pt reports he requires assist with ADLs            OT Problem List: Decreased strength;Decreased activity tolerance;Impaired balance (sitting and/or standing);Decreased coordination;Pain;Impaired UE functional use;Increased edema;Cardiopulmonary status limiting activity;Decreased cognition;Decreased safety awareness      OT Treatment/Interventions: Self-care/ADL training;Therapeutic exercise;Neuromuscular education;Energy conservation;DME and/or AE instruction;Therapeutic activities;Cognitive remediation/compensation;Patient/family education;Balance training;Visual/perceptual remediation/compensation    OT Goals(Current goals can be found in the care plan section) Acute Rehab OT Goals Patient Stated Goal: to go somewhere other than [place] OT Goal Formulation: With patient Time For Goal Achievement: 03/05/21 Potential to Achieve Goals: Good ADL Goals Pt Will Perform Grooming: with set-up;sitting Additional ADL Goal #1: Pt will laterally scoot towards  HOB with minA and proper sequencing with minimal cues. Additional ADL Goal #2: Pt will perform bed mobility with minA overall with bed rails and momentum from supine to sitting EOB  OT Frequency: Min 2X/week   Barriers to D/C: Decreased caregiver support          Co-evaluation              AM-PAC OT "6 Clicks" Daily Activity     Outcome Measure Help from another person eating meals?: A Little Help from another person taking care of personal grooming?: A Little Help from another person toileting, which includes using toliet, bedpan, or urinal?: A Lot Help from another person bathing (including washing, rinsing, drying)?: A Lot Help from another person to put on and taking off regular upper body clothing?: A Lot Help from another person to put on and taking off regular lower body clothing?: A Lot 6 Click Score: 14   End of Session Equipment Utilized During Treatment: Gait belt Nurse Communication: Mobility status  Activity Tolerance: Patient tolerated treatment well Patient left: in bed;with call bell/phone within reach;with bed alarm set  OT Visit Diagnosis: Unsteadiness on feet (R26.81);Muscle weakness (generalized) (M62.81)                Time: [id number] OT Time Calculation (min): 23 min Charges:  OT General Charges $OT Visit: 1 Visit OT Evaluation $OT Eval Moderate Complexity: 1 Mod OT Treatments $Self Care/Home Management : 8-22 mins  Jefferey Pica, OTR/L Acute Rehabilitation Services Pager: [phone] Office: [phone]   Renn Stille C 02/19/2021, 5:24 PM

## 2021-02-19 NOTE — Plan of Care (Signed)
  Problem: Education: Goal: Knowledge of General Education information will improve Description: Including pain rating scale, medication(s)/side effects and non-pharmacologic comfort measures Outcome: Progressing   Problem: Health Behavior/Discharge Planning: Goal: Ability to manage health-related needs will improve Outcome: Progressing   Problem: Clinical Measurements: Goal: Ability to maintain clinical measurements within normal limits will improve Outcome: Progressing Goal: Diagnostic test results will improve Outcome: Progressing Goal: Cardiovascular complication will be avoided Outcome: Progressing   Problem: Activity: Goal: Risk for activity intolerance will decrease Outcome: Progressing   Problem: Elimination: Goal: Will not experience complications related to bowel motility Outcome: Progressing Goal: Will not experience complications related to urinary retention Outcome: Progressing   Problem: Safety: Goal: Ability to remain free from injury will improve Outcome: Progressing   Problem: Skin Integrity: Goal: Risk for impaired skin integrity will decrease Outcome: Progressing   Problem: Education: Goal: Ability to demonstrate management of disease process will improve Outcome: Progressing Goal: Ability to verbalize understanding of medication therapies will improve Outcome: Progressing Goal: Individualized Educational Video(s) Outcome: Progressing   Problem: Activity: Goal: Capacity to carry out activities will improve Outcome: Progressing   Problem: Cardiac: Goal: Ability to achieve and maintain adequate cardiopulmonary perfusion will improve Outcome: Progressing

## 2021-02-19 NOTE — Progress Notes (Signed)
PROGRESS NOTE    Richard [name]  [id number] DOB: [birth year] DOA: 02/17/2021 PCP: [name], MD    Chief Complaint  Patient presents with  . Shortness of Breath    Brief Narrative:   Richard [name] is a 65 year old male with past medical history significant for ESRD on HD, essential hypertension, DM2, HIV, seizure disorder who presented to [facility] ED with progressive shortness of breath.  Assessment & Plan:   Active Problems:   GERD (gastroesophageal reflux disease)   DM2 (diabetes mellitus, type 2) (HCC)   AIDS (acquired immune deficiency syndrome) ([place])   Hypertensive urgency   Dialysis patient, noncompliant (HCC)   Elevated troponin   Acute on chronic diastolic CHF (congestive heart failure) (HCC)   Depression   Acute hypoxic respiratory failure secondary to acute on chronic diastolic heart failure, currently requiring up to 2 L of nasal cannula oxygen with elevated BNP and chest x-ray findings of pulmonary edema Secondary to noncompliance with hemodialysis Echocardiogram showed left ventricular ejection fraction of 50 to 55% without any regional wall abnormalities Continue with hemodialysis for volume management Currently he is weaned off and on room air at this time.     Hypertensive urgency Probably secondary to fluid overload Resolved Continue with clonidine and Coreg    Elevated troponin secondary to demand ischemia in the setting of volume overload and hypertensive urgency EKG does not show any ischemic changes.    Type 2 diabetes mellitus, insulin-dependent Continue with Lantus and sliding scale insulin.    History of seizure disorder Continue with Keppra 500 mg twice daily.    HIV Continue with Biktarvy and Bactrim    Hyperlipidemia resume pravastatin 80 mg    History of CVA  continue with Plavix and statin      DVT prophylaxis: heparin Code Status: full code.  Family Communication: none at bedside.   Disposition:   Status is: Inpatient  Remains inpatient appropriate because:Inpatient level of care appropriate due to severity of illness   Dispo: The patient is from: SNF              Anticipated d/c is to: SNF              Patient currently is medically stable to d/c.   Difficult to place patient No       Consultants:   Nephrology.    Procedures: none.    Antimicrobials: none.    Subjective: NO NEW COMPLAINTS.   Objective: Vitals:   02/19/21 1040 02/19/21 1100 02/19/21 1112 02/19/21 1113  BP: 123/71 121/67 128/69 (!) 147/72  Pulse:    76  Resp:  16  17  Temp: 98 F (36.7 C) 98.1 F (36.7 C)  98.6 F (37 C)  TempSrc: Oral Oral  Oral  SpO2: 98% 97%    Weight:  70.1 kg      Intake/Output Summary (Last 24 hours) at 02/19/2021 1619 Last data filed at 02/19/2021 1422 Gross per 24 hour  Intake 435 ml  Output 4950 ml  Net -4515 ml   Filed Weights   02/19/21 0427 02/19/21 0715 02/19/21 1100  Weight: 75.6 kg 74.9 kg 70.1 kg    Examination:  General exam: Appears calm and comfortable  Respiratory system: Clear to auscultation. Respiratory effort normal. Cardiovascular system: S1 & S2 heard, RRR. No JVD, murmurs, rubs, gallops or clicks. No pedal edema. Gastrointestinal system: Abdomen is nondistended, soft and nontender.Normal bowel sounds heard. Central nervous system: Alert and oriented. No focal neurological  deficits. Extremities: Symmetric 5 x 5 power. Skin: No rashes, lesions or ulcers Psychiatry: . Mood & affect appropriate.     Data Reviewed: I have personally reviewed following labs and imaging studies  CBC: Recent Labs  Lab 02/17/21 0523 02/19/21 0627  WBC 8.0 2.6*  NEUTROABS 5.8  --   HGB 7.8* 6.9*  HCT 25.3* 22.0*  MCV 100.4* 100.9*  PLT 145* [id number]    Basic Metabolic Panel: Recent Labs  Lab 02/17/21 0523 02/17/21 1140 02/19/21 0627  NA 141  --  138  K 4.9  --  4.2  CL 110  --  102  CO2 22  --  25  GLUCOSE 138*  --  89   BUN 54*  --  34*  CREATININE 10.16* 10.37* 7.27*  CALCIUM 8.2*  --  7.8*  PHOS  --   --  4.4    GFR: CrCl cannot be calculated (Unknown ideal weight.).  Liver Function Tests: Recent Labs  Lab 02/19/21 0627  ALBUMIN 2.3*    CBG: Recent Labs  Lab 02/18/21 1123 02/18/21 1534 02/18/21 2117 02/19/21 1213 02/19/21 1602  GLUCAP 129* 172* 156* 121* 166*     Recent Results (from the past 240 hour(s))  Resp Panel by RT-PCR (Flu A&B, Covid) Nasopharyngeal Swab     Status: None   Collection Time: 02/17/21  6:59 AM   Specimen: Nasopharyngeal Swab; Nasopharyngeal(NP) swabs in vial transport medium  Result Value Ref Range Status   SARS Coronavirus 2 by RT PCR NEGATIVE NEGATIVE Final    Comment: (NOTE) SARS-CoV-2 target nucleic acids are NOT DETECTED.  The SARS-CoV-2 RNA is generally detectable in upper respiratory specimens during the acute phase of infection. The lowest concentration of SARS-CoV-2 viral copies this assay can detect is 138 copies/mL. A negative result does not preclude SARS-Cov-2 infection and should not be used as the sole basis for treatment or other patient management decisions. A negative result may occur with  improper specimen collection/handling, submission of specimen other than nasopharyngeal swab, presence of viral mutation(s) within the areas targeted by this assay, and inadequate number of viral copies(<138 copies/mL). A negative result must be combined with clinical observations, patient history, and epidemiological information. The expected result is Negative.  Fact Sheet for Patients:  EntrepreneurPulse.com.au  Fact Sheet for Healthcare Providers:  IncredibleEmployment.be  This test is no t yet approved or cleared by the [place] FDA and  has been authorized for detection and/or diagnosis of SARS-CoV-2 by FDA under an Emergency Use Authorization (EUA). This EUA will remain  in effect (meaning this  test can be used) for the duration of the COVID-19 declaration under Section 564(b)(1) of the Act, 21 U.S.C.section 360bbb-3(b)(1), unless the authorization is terminated  or revoked sooner.       Influenza A by PCR NEGATIVE NEGATIVE Final   Influenza B by PCR NEGATIVE NEGATIVE Final    Comment: (NOTE) The Xpert Xpress SARS-CoV-2/FLU/RSV plus assay is intended as an aid in the diagnosis of influenza from Nasopharyngeal swab specimens and should not be used as a sole basis for treatment. Nasal washings and aspirates are unacceptable for Xpert Xpress SARS-CoV-2/FLU/RSV testing.  Fact Sheet for Patients: EntrepreneurPulse.com.au  Fact Sheet for Healthcare Providers: IncredibleEmployment.be  This test is not yet approved or cleared by the [place] FDA and has been authorized for detection and/or diagnosis of SARS-CoV-2 by FDA under an Emergency Use Authorization (EUA). This EUA will remain in effect (meaning this test can be used) for the duration  of the COVID-19 declaration under Section 564(b)(1) of the Act, 21 U.S.C. section 360bbb-3(b)(1), unless the authorization is terminated or revoked.  Performed at [facility], [place] [address], [place], [place] [address]          Radiology Studies: No results found.      Scheduled Meds: . sodium chloride   Intravenous Once  . bictegravir-emtricitabine-tenofovir AF  1 tablet Oral Daily  . carvedilol  12.5 mg Oral BID WC  . Chlorhexidine Gluconate Cloth  6 each Topical Q0600  . cloNIDine  0.2 mg Transdermal Weekly  . clopidogrel  75 mg Oral Daily  . [START ON 02/26/2021] darbepoetin (ARANESP) injection - DIALYSIS  100 mcg Intravenous Q Tue-HD  . doxepin  25 mg Oral QHS   And  . doxepin  50 mg Oral QHS  . doxercalciferol  2 mcg Intravenous Q T,Th,Sa-HD  . heparin  5,000 Units Subcutaneous Q8H  . insulin aspart  0-5 Units Subcutaneous QHS  . insulin aspart  0-6 Units  Subcutaneous TID WC  . levETIRAcetam  500 mg Oral BID  . pravastatin  80 mg Oral QHS  . sulfamethoxazole-trimethoprim  1 tablet Oral Q M,W,F  . tamsulosin  0.4 mg Oral Daily   Continuous Infusions:   LOS: 2 days        Hosie Poisson, MD [facility]   To contact the attending provider between 7A-7P or the covering provider during after hours 7P-7A, please log into the web site [email or web address] and access using universal [place] password for that web site. If you do not have the password, please call the hospital operator.  02/19/2021, 4:19 PM

## 2021-02-20 DIAGNOSIS — Z794 Long term (current) use of insulin: Secondary | ICD-10-CM

## 2021-02-20 DIAGNOSIS — I5033 Acute on chronic diastolic (congestive) heart failure: Secondary | ICD-10-CM | POA: Diagnosis not present

## 2021-02-20 DIAGNOSIS — Z992 Dependence on renal dialysis: Secondary | ICD-10-CM | POA: Diagnosis not present

## 2021-02-20 DIAGNOSIS — E1122 Type 2 diabetes mellitus with diabetic chronic kidney disease: Secondary | ICD-10-CM | POA: Diagnosis not present

## 2021-02-20 DIAGNOSIS — N186 End stage renal disease: Secondary | ICD-10-CM

## 2021-02-20 LAB — TYPE AND SCREEN
ABO/RH(D): O POS
Antibody Screen: NEGATIVE
Unit division: 0

## 2021-02-20 LAB — BPAM RBC
Blood Product Expiration Date: 202206292359
ISSUE DATE / TIME: 202205310952
Unit Type and Rh: 5100

## 2021-02-20 LAB — GLUCOSE, CAPILLARY
Glucose-Capillary: 90 mg/dL (ref 70–99)
Glucose-Capillary: 135 mg/dL — ABNORMAL HIGH (ref 70–99)
Glucose-Capillary: 115 mg/dL — ABNORMAL HIGH (ref 70–99)
Glucose-Capillary: 188 mg/dL — ABNORMAL HIGH (ref 70–99)

## 2021-02-20 LAB — SARS CORONAVIRUS 2 (TAT 6-24 HRS): SARS Coronavirus 2: NEGATIVE

## 2021-02-20 MED ORDER — CLONIDINE HCL 0.1 MG PO TABS
0.1000 mg | ORAL_TABLET | Freq: Three times a day (TID) | ORAL | Status: DC
  Administered 2021-02-20 – 2021-02-21 (×4): 0.1 mg via ORAL
  Filled 2021-02-20 (×4): qty 1

## 2021-02-20 NOTE — Progress Notes (Signed)
PT Cancellation Note  Patient Details Name: Richard [name] MRN: [id number] DOB: 06/12/1956   Cancelled Treatment:    Reason Eval/Treat Not Completed: (P) Patient declined, no reason specified (pt refusing OOB, states "maybe tomorrow".) Will continue efforts per PT POC as schedule permits.   [name] 02/20/2021, 5:42 PM

## 2021-02-20 NOTE — Progress Notes (Signed)
PROGRESS NOTE    Richard [name]  [id number] DOB: Jul 28, 1956 DOA: 02/17/2021 PCP: [name], MD    Chief Complaint  Patient presents with  . Shortness of Breath    Brief Narrative:   Richard [name] is a 65 year old male with past medical history significant for ESRD on HD, essential hypertension, DM2, HIV, seizure disorder who presented to [facility] ED with progressive shortness of breath.  Patient was admitted for fluid overload secondary to noncompliance with hemodialysis. Patient seen and examined today denies any new complaints at this time Assessment & Plan:   Active Problems:   GERD (gastroesophageal reflux disease)   DM2 (diabetes mellitus, type 2) (HCC)   AIDS (acquired immune deficiency syndrome) ([place])   Hypertensive urgency   Dialysis patient, noncompliant (HCC)   Elevated troponin   Acute on chronic diastolic CHF (congestive heart failure) (HCC)   Depression   Acute hypoxic respiratory failure secondary to acute on chronic diastolic heart failure, currently requiring up to 2 L of nasal cannula oxygen with elevated BNP and chest x-ray findings of pulmonary edema Secondary to noncompliance with hemodialysis Echocardiogram showed left ventricular ejection fraction of 50 to 55% without any regional wall abnormalities Continue with hemodialysis for volume management Currently he is weaned off and on room air at this time. No new complaints today    Hypertensive urgency Probably secondary to fluid overload Resolved Continue with clonidine and Coreg.  Parameters appear to be optimal     Elevated troponin secondary to demand ischemia in the setting of volume overload and hypertensive urgency EKG does not show any ischemic changes.  Patient currently denies chest pain or shortness of breath    Type 2 diabetes mellitus, insulin-dependent Continue with Lantus and sliding scale insulin. CBG (last 3)  Recent Labs    02/20/21 0742  02/20/21 1210 02/20/21 1603  GLUCAP 90 135* 115*   No changes in medications    History of seizure disorder Continue with Keppra 500 mg twice daily.    HIV Continue with Biktarvy and Bactrim    Hyperlipidemia resume pravastatin 80 mg    History of CVA  continue with Plavix and statin      DVT prophylaxis: heparin Code Status: full code.  Family Communication: none at bedside.  Disposition:   Status is: Inpatient  Remains inpatient appropriate because:Inpatient level of care appropriate due to severity of illness   Dispo: The patient is from: SNF              Anticipated d/c is to: SNF              Patient currently is medically stable to d/c.   Difficult to place patient No       Consultants:   Nephrology.    Procedures: none.    Antimicrobials: none.    Subjective: Patient denies any chest pain or shortness of breath  Objective: Vitals:   02/19/21 1113 02/19/21 2055 02/20/21 0500 02/20/21 0744  BP: (!) 147/72 (!) 143/70 138/78 (!) 145/81  Pulse: 76 75 80 72  Resp: '17 12 15 15  '$ Temp: 98.6 F (37 C) 98.4 F (36.9 C) 98.5 F (36.9 C) 98.6 F (37 C)  TempSrc: Oral Oral Oral Oral  SpO2:  97%  100%  Weight:   71 kg     Intake/Output Summary (Last 24 hours) at 02/20/2021 1701 Last data filed at 02/20/2021 1640 Gross per 24 hour  Intake 356 ml  Output 375 ml  Net -  19 ml   Filed Weights   02/19/21 0715 02/19/21 1100 02/20/21 0500  Weight: 74.9 kg 70.1 kg 71 kg    Examination:  General exam: Alert and comfortable not in any kind of distress Respiratory system: Air entry fair bilateral no wheezing or rhonchi Cardiovascular system: S1-S2 heard, regular rate rhythm, no JVD Gastrointestinal system: Abdomen is soft nontender bowel sounds normal Central nervous system: Alert and oriented grossly nonfocal Extremities: No pedal edema Skin: No rashes seen Psychiatry: . Mood & affect appropriate.     Data Reviewed: I have personally  reviewed following labs and imaging studies  CBC: Recent Labs  Lab 02/17/21 0523 02/19/21 0627 02/19/21 1549  WBC 8.0 2.6*  --   NEUTROABS 5.8  --   --   HGB 7.8* 6.9* 9.1*  HCT 25.3* 22.0* 27.9*  MCV 100.4* 100.9*  --   PLT 145* 155  --     Basic Metabolic Panel: Recent Labs  Lab 02/17/21 0523 02/17/21 1140 02/19/21 0627  NA 141  --  138  K 4.9  --  4.2  CL 110  --  102  CO2 22  --  25  GLUCOSE 138*  --  89  BUN 54*  --  34*  CREATININE 10.16* 10.37* 7.27*  CALCIUM 8.2*  --  7.8*  PHOS  --   --  4.4    GFR: CrCl cannot be calculated (Unknown ideal weight.).  Liver Function Tests: Recent Labs  Lab 02/19/21 0627  ALBUMIN 2.3*    CBG: Recent Labs  Lab 02/19/21 1602 02/19/21 2103 02/20/21 0742 02/20/21 1210 02/20/21 1603  GLUCAP 166* 132* 90 135* 115*     Recent Results (from the past 240 hour(s))  Resp Panel by RT-PCR (Flu A&B, Covid) Nasopharyngeal Swab     Status: None   Collection Time: 02/17/21  6:59 AM   Specimen: Nasopharyngeal Swab; Nasopharyngeal(NP) swabs in vial transport medium  Result Value Ref Range Status   SARS Coronavirus 2 by RT PCR NEGATIVE NEGATIVE Final    Comment: (NOTE) SARS-CoV-2 target nucleic acids are NOT DETECTED.  The SARS-CoV-2 RNA is generally detectable in upper respiratory specimens during the acute phase of infection. The lowest concentration of SARS-CoV-2 viral copies this assay can detect is 138 copies/mL. A negative result does not preclude SARS-Cov-2 infection and should not be used as the sole basis for treatment or other patient management decisions. A negative result may occur with  improper specimen collection/handling, submission of specimen other than nasopharyngeal swab, presence of viral mutation(s) within the areas targeted by this assay, and inadequate number of viral copies(<138 copies/mL). A negative result must be combined with clinical observations, patient history, and  epidemiological information. The expected result is Negative.  Fact Sheet for Patients:  EntrepreneurPulse.com.au  Fact Sheet for Healthcare Providers:  IncredibleEmployment.be  This test is no t yet approved or cleared by the [place] FDA and  has been authorized for detection and/or diagnosis of SARS-CoV-2 by FDA under an Emergency Use Authorization (EUA). This EUA will remain  in effect (meaning this test can be used) for the duration of the COVID-19 declaration under Section 564(b)(1) of the Act, 21 U.S.C.section 360bbb-3(b)(1), unless the authorization is terminated  or revoked sooner.       Influenza A by PCR NEGATIVE NEGATIVE Final   Influenza B by PCR NEGATIVE NEGATIVE Final    Comment: (NOTE) The Xpert Xpress SARS-CoV-2/FLU/RSV plus assay is intended as an aid in the diagnosis of influenza from  Nasopharyngeal swab specimens and should not be used as a sole basis for treatment. Nasal washings and aspirates are unacceptable for Xpert Xpress SARS-CoV-2/FLU/RSV testing.  Fact Sheet for Patients: EntrepreneurPulse.com.au  Fact Sheet for Healthcare Providers: IncredibleEmployment.be  This test is not yet approved or cleared by the [place] FDA and has been authorized for detection and/or diagnosis of SARS-CoV-2 by FDA under an Emergency Use Authorization (EUA). This EUA will remain in effect (meaning this test can be used) for the duration of the COVID-19 declaration under Section 564(b)(1) of the Act, 21 U.S.C. section 360bbb-3(b)(1), unless the authorization is terminated or revoked.  Performed at [facility], [place] [address], [place], [place] [address]   SARS CORONAVIRUS 2 (TAT 6-24 HRS) Nasopharyngeal Nasopharyngeal Swab     Status: None   Collection Time: 02/20/21 10:51 AM   Specimen: Nasopharyngeal Swab  Result Value Ref Range Status   SARS Coronavirus 2  NEGATIVE NEGATIVE Final    Comment: (NOTE) SARS-CoV-2 target nucleic acids are NOT DETECTED.  The SARS-CoV-2 RNA is generally detectable in upper and lower respiratory specimens during the acute phase of infection. Negative results do not preclude SARS-CoV-2 infection, do not rule out co-infections with other pathogens, and should not be used as the sole basis for treatment or other patient management decisions. Negative results must be combined with clinical observations, patient history, and epidemiological information. The expected result is Negative.  Fact Sheet for Patients: SugarRoll.be  Fact Sheet for Healthcare Providers: https://www.woods-mathews.com/  This test is not yet approved or cleared by the [place] FDA and  has been authorized for detection and/or diagnosis of SARS-CoV-2 by FDA under an Emergency Use Authorization (EUA). This EUA will remain  in effect (meaning this test can be used) for the duration of the COVID-19 declaration under Se ction 564(b)(1) of the Act, 21 U.S.C. section 360bbb-3(b)(1), unless the authorization is terminated or revoked sooner.  Performed at [facility], [place] [address], [place], [place] [address]          Radiology Studies: No results found.      Scheduled Meds: . sodium chloride   Intravenous Once  . bictegravir-emtricitabine-tenofovir AF  1 tablet Oral Daily  . carvedilol  12.5 mg Oral BID WC  . Chlorhexidine Gluconate Cloth  6 each Topical Q0600  . cloNIDine  0.1 mg Oral TID  . clopidogrel  75 mg Oral Daily  . [START ON 02/26/2021] darbepoetin (ARANESP) injection - DIALYSIS  100 mcg Intravenous Q Tue-HD  . doxepin  25 mg Oral QHS   And  . doxepin  50 mg Oral QHS  . doxercalciferol  2 mcg Intravenous Q T,Th,Sa-HD  . heparin  5,000 Units Subcutaneous Q8H  . insulin aspart  0-5 Units Subcutaneous QHS  . insulin aspart  0-6 Units Subcutaneous TID WC  . levETIRAcetam   500 mg Oral BID  . pravastatin  80 mg Oral QHS  . sulfamethoxazole-trimethoprim  1 tablet Oral Q M,W,F  . tamsulosin  0.4 mg Oral Daily   Continuous Infusions:   LOS: 3 days        Hosie Poisson, MD [facility]   To contact the attending provider between 7A-7P or the covering provider during after hours 7P-7A, please log into the web site [email or web address] and access using universal [place] password for that web site. If you do not have the password, please call the hospital operator.  02/20/2021, 5:01 PM

## 2021-02-20 NOTE — TOC Progression Note (Signed)
Transition of Care [facility]) - Progression Note    Patient Details  Name: Richard [name] MRN: [id number] Date of Birth: 1956/07/17  Transition [facility] Care [facility]) CM/SW [name], [place] Phone Number: 02/20/2021, 1:05 PM  Clinical Narrative:     CSW spoke with [place] who started insurance authorization for patient. Insurance authorization is pending. Patient has SN[facility] bed at [facility]. CSW will continue to follow and assist with discharge planning needs.   Expected Discharge Plan: [place] Barriers to Discharge: [place],Unsafe home situation  Expected Discharge Plan and Services Expected Discharge Plan: [place] arrangements for the past 2 months: [place]                                       Social Determinants of Health (SDOH) Interventions    Readmission Risk Interventions Readmission Risk Prevention Plan 02/15/2020  Transportation Screening Complete  PCP or Specialist Appt within 5-7 Days Not Complete  Not Complete comments pending disposition  Home Care Screening Complete  Medication Review (RN CM) Referral to Pharmacy  Some recent data might be hidden

## 2021-02-20 NOTE — Consult Note (Signed)
   [facility] CM Inpatient Consult   02/20/2021  Reily Ilic 07/27/1956 [id number]   [place] Organization [ACO] Patient: [facility] Medicare   Primary Care Provider: [name], MD, [place] Embedded   Patient was screened for transition of care call conducted by the primary care provider. This patient  Primary care provider is  an [facility] which has a chronic disease management Embedded Care Management team. Chart reviewed for transition of care follow up needs as patient is in the extreme high risk for unplanned readmission score with a less than 30 days readmission noted. Patient is currently being recommended for a skilled nursing facility transition,  Plan: Follow for disposition, if going to a SNF then needs are to be met at that level of care for TOC. Will follow til disposition is finalized.  Please contact for further questions,  Natividad Brood, RN BSN [facility] Liaison  [phone] business mobile phone Toll free office [phone]  Fax number: [phone] [email or web address]

## 2021-02-20 NOTE — Progress Notes (Signed)
Mobility Specialist: Progress Note   02/20/21 1732  Mobility  Activity Refused mobility   Pt refused mobility, no reason specified. Will f/u tomorrow.   [facility] Kenneth Lax Mobility Specialist Mobility Specialist Phone: [phone]

## 2021-02-20 NOTE — Progress Notes (Signed)
[facility] Progress Note   Subjective:  Seen in room. No c/o.   Objective Vitals:   02/19/21 1113 02/19/21 2055 02/20/21 0500 02/20/21 0744  BP: (!) 147/72 (!) 143/70 138/78 (!) 145/81  Pulse: 76 75 80 72  Resp: '17 12 15 15  '$ Temp: 98.6 F (37 C) 98.4 F (36.9 C) 98.5 F (36.9 C) 98.6 F (37 C)  TempSrc: Oral Oral Oral Oral  SpO2:  97%  100%  Weight:   71 kg    Physical Exam General: Well developed male, alert and in NAD Heart: RRR, no murmurs, rubs or gallops Lungs: CTA bilaterally without wheezing, rhonchi or rales Abdomen: Soft, non-tender, non-distended, +BS Extremities: No edema b/l lower extremiti Dialysis Access: LUE AVF  Additional Objective Labs: Basic Metabolic Panel: Recent Labs  Lab 02/17/21 0523 02/17/21 1140 02/19/21 0627  NA 141  --  138  K 4.9  --  4.2  CL 110  --  102  CO2 22  --  25  GLUCOSE 138*  --  89  BUN 54*  --  34*  CREATININE 10.16* 10.37* 7.27*  CALCIUM 8.2*  --  7.8*  PHOS  --   --  4.4   Liver Function Tests: Recent Labs  Lab 02/19/21 0627  ALBUMIN 2.3*   CBC: Recent Labs  Lab 02/17/21 0523 02/19/21 0627 02/19/21 1549  WBC 8.0 2.6*  --   NEUTROABS 5.8  --   --   HGB 7.8* 6.9* 9.1*  HCT 25.3* 22.0* 27.9*  MCV 100.4* 100.9*  --   PLT 145* 155  --    Blood Culture    Component Value Date/Time   SDES CSF 04/14/2020 1827   SPECREQUEST NONE 04/14/2020 1827   CULT  04/14/2020 1827    NO GROWTH 3 DAYS Performed at [facility], [place] [address] [place], [place] [address]    REPTSTATUS 04/18/2020 FINAL 04/14/2020 1827   CBG: Recent Labs  Lab 02/18/21 2117 02/19/21 1213 02/19/21 1602 02/19/21 2103 02/20/21 0742  GLUCAP 156* 121* 166* 132* 90    Studies/Results: No results found. Medications:  . sodium chloride   Intravenous Once  . bictegravir-emtricitabine-tenofovir AF  1 tablet Oral Daily  . carvedilol  12.5 mg Oral BID WC  . Chlorhexidine Gluconate Cloth  6 each Topical Q0600  .  cloNIDine  0.2 mg Transdermal Weekly  . clopidogrel  75 mg Oral Daily  . [START ON 02/26/2021] darbepoetin (ARANESP) injection - DIALYSIS  100 mcg Intravenous Q Tue-HD  . doxepin  25 mg Oral QHS   And  . doxepin  50 mg Oral QHS  . doxercalciferol  2 mcg Intravenous Q T,Th,Sa-HD  . heparin  5,000 Units Subcutaneous Q8H  . insulin aspart  0-5 Units Subcutaneous QHS  . insulin aspart  0-6 Units Subcutaneous TID WC  . levETIRAcetam  500 mg Oral BID  . pravastatin  80 mg Oral QHS  . sulfamethoxazole-trimethoprim  1 tablet Oral Q M,W,F  . tamsulosin  0.4 mg Oral Daily    OP HD: [place] [facility]  4h  67.5kg  2/2 bath  Hep 1200  LUE AVF  - hect 2 ug IV tiw  - mircera 100 ug q2, , last dose 09/18/20 (misses HD d/t [place])  - home: coreg 12.5 bid, no binder noted  Assessment/Plan: 1. SOB/ vol overload/ pulm edema - due to missed HD x 2. Had HD w/ 3.5 L off 5/29 and 4.7 L UF on 5/31.  Down  6 kg here and resp issues resolved. Still up 3-4 kg by wts. Max UF w/ HD Thursday. ; 2. ESRD - on HD TTS. HD tomorrow.  3. HTN urgency - cont to lower vol. BP's down, in normal range. Will dc clonidine patch, start clon 0.1 tid in anticipation of HD tomorrow then taper off.  4. DM2- per primary team 5. HIV - per primary team 6. MBD ckd -Calcium and phos controlled. Continue vdra, no binder listed 7. Anemia ckd - Hb 7.8 > 6.9, misses esa at OP HD. Got darbe 100 ug IV yest 5/29. Cont weekly while here.  Consents to 1 unit PRBC today.  8. Debility - came from SNF. PT evaluating.  9. Seizure d/o - on keppra  Kelly Splinter, MD 02/20/2021, 10:02 AM

## 2021-02-20 NOTE — Plan of Care (Signed)
  Problem: Education: Goal: Knowledge of General Education information will improve Description: Including pain rating scale, medication(s)/side effects and non-pharmacologic comfort measures Outcome: Progressing   Problem: Health Behavior/Discharge Planning: Goal: Ability to manage health-related needs will improve Outcome: Progressing   Problem: Clinical Measurements: Goal: Ability to maintain clinical measurements within normal limits will improve Outcome: Progressing Goal: Diagnostic test results will improve Outcome: Progressing Goal: Cardiovascular complication will be avoided Outcome: Progressing   Problem: Activity: Goal: Risk for activity intolerance will decrease Outcome: Progressing   Problem: Elimination: Goal: Will not experience complications related to bowel motility Outcome: Progressing Goal: Will not experience complications related to urinary retention Outcome: Progressing   Problem: Safety: Goal: Ability to remain free from injury will improve Outcome: Progressing   Problem: Skin Integrity: Goal: Risk for impaired skin integrity will decrease Outcome: Progressing   Problem: Education: Goal: Ability to demonstrate management of disease process will improve Outcome: Progressing Goal: Ability to verbalize understanding of medication therapies will improve Outcome: Progressing Goal: Individualized Educational Video(s) Outcome: Progressing   Problem: Activity: Goal: Capacity to carry out activities will improve Outcome: Progressing   Problem: Cardiac: Goal: Ability to achieve and maintain adequate cardiopulmonary perfusion will improve Outcome: Progressing

## 2021-02-21 DIAGNOSIS — I5033 Acute on chronic diastolic (congestive) heart failure: Secondary | ICD-10-CM | POA: Diagnosis not present

## 2021-02-21 LAB — RENAL FUNCTION PANEL
Albumin: 2.4 g/dL — ABNORMAL LOW (ref 3.5–5.0)
Anion gap: 9 (ref 5–15)
BUN: 41 mg/dL — ABNORMAL HIGH (ref 8–23)
CO2: 25 mmol/L (ref 22–32)
Calcium: 8.4 mg/dL — ABNORMAL LOW (ref 8.9–10.3)
Chloride: 101 mmol/L (ref 98–111)
Creatinine, Ser: 6.97 mg/dL — ABNORMAL HIGH (ref 0.61–1.24)
GFR, Estimated: 8 mL/min — ABNORMAL LOW (ref >60)
Glucose, Bld: 105 mg/dL — ABNORMAL HIGH (ref 70–99)
Phosphorus: 4.7 mg/dL — ABNORMAL HIGH (ref 2.5–4.6)
Potassium: 4.2 mmol/L (ref 3.5–5.1)
Sodium: 135 mmol/L (ref 135–145)

## 2021-02-21 LAB — HEPATITIS B SURFACE ANTIGEN: Hepatitis B Surface Ag: NONREACTIVE

## 2021-02-21 LAB — CBC
HCT: 27.3 % — ABNORMAL LOW (ref 39.0–52.0)
Hemoglobin: 8.6 g/dL — ABNORMAL LOW (ref 13.0–17.0)
MCH: 30.7 pg (ref 26.0–34.0)
MCHC: 31.5 g/dL (ref 30.0–36.0)
MCV: 97.5 fL (ref 80.0–100.0)
Platelets: 141 10*3/uL — ABNORMAL LOW (ref 150–400)
RBC: 2.8 MIL/uL — ABNORMAL LOW (ref 4.22–5.81)
RDW: 16.7 % — ABNORMAL HIGH (ref 11.5–15.5)
WBC: 5.5 10*3/uL (ref 4.0–10.5)
nRBC: 0.4 % — ABNORMAL HIGH (ref 0.0–0.2)

## 2021-02-21 LAB — GLUCOSE, CAPILLARY
Glucose-Capillary: 117 mg/dL — ABNORMAL HIGH (ref 70–99)
Glucose-Capillary: 181 mg/dL — ABNORMAL HIGH (ref 70–99)
Glucose-Capillary: 198 mg/dL — ABNORMAL HIGH (ref 70–99)
Glucose-Capillary: 163 mg/dL — ABNORMAL HIGH (ref 70–99)

## 2021-02-21 MED ORDER — HEPARIN SODIUM (PORCINE) 1000 UNIT/ML DIALYSIS: 1200.0000 [IU] | Freq: Once | INTRAMUSCULAR | Status: AC

## 2021-02-21 MED ORDER — DOXERCALCIFEROL 4 MCG/2ML IV SOLN
INTRAVENOUS | Status: AC
  Administered 2021-02-21: 2 ug via INTRAVENOUS
  Filled 2021-02-21: qty 2

## 2021-02-21 MED ORDER — HEPARIN SODIUM (PORCINE) 1000 UNIT/ML IJ SOLN
INTRAMUSCULAR | Status: AC
  Administered 2021-02-21: 1200 [IU] via INTRAVENOUS_CENTRAL
  Filled 2021-02-21: qty 2

## 2021-02-21 MED ORDER — CLONIDINE HCL 0.1 MG PO TABS: 0.1000 mg | ORAL_TABLET | Freq: Three times a day (TID) | ORAL | 11 refills | Status: DC

## 2021-02-21 NOTE — Plan of Care (Signed)
  Problem: Education: Goal: Knowledge of General Education information will improve Description: Including pain rating scale, medication(s)/side effects and non-pharmacologic comfort measures Outcome: Progressing   Problem: Health Behavior/Discharge Planning: Goal: Ability to manage health-related needs will improve Outcome: Progressing   Problem: Clinical Measurements: Goal: Ability to maintain clinical measurements within normal limits will improve Outcome: Progressing Goal: Diagnostic test results will improve Outcome: Progressing Goal: Cardiovascular complication will be avoided Outcome: Progressing   Problem: Activity: Goal: Risk for activity intolerance will decrease Outcome: Progressing   Problem: Elimination: Goal: Will not experience complications related to bowel motility Outcome: Progressing Goal: Will not experience complications related to urinary retention Outcome: Progressing   Problem: Safety: Goal: Ability to remain free from injury will improve Outcome: Progressing   Problem: Skin Integrity: Goal: Risk for impaired skin integrity will decrease Outcome: Progressing   Problem: Education: Goal: Ability to demonstrate management of disease process will improve Outcome: Progressing Goal: Ability to verbalize understanding of medication therapies will improve Outcome: Progressing Goal: Individualized Educational Video(s) Outcome: Progressing   Problem: Activity: Goal: Capacity to carry out activities will improve Outcome: Progressing   Problem: Cardiac: Goal: Ability to achieve and maintain adequate cardiopulmonary perfusion will improve Outcome: Progressing

## 2021-02-21 NOTE — Progress Notes (Addendum)
Pt arrived to HD unit @ 0700  Vitals stable on room air, on bedside monitor Tx started at 0721 Although fistula accessed without issue initially, there was some positional problems until needle was repositioned slightly. Pt irate about the discomfort and setback, saying, "Im about to pull these needles out, get me outta here," but calmed, received apology, and tx progressed without issue

## 2021-02-21 NOTE — Progress Notes (Addendum)
@  0900 pt machine alarmed for low BP - reading was 83/49, recheck was the same.  Goal adjusted down by 517mL, placed slightly trendelenburg, tx resumed, with BP 95/56  @ 0911, pt complained he was hot, short of breath, and drowsy, with slurred speech and yawning. BP reading was 73/33.  Trendelenberg increased, temperature lowered, and goal adjusted down by 1000 mL. UF paused briefly, then restarted, BP @ 0914 was 92/55. Since parameters met, tx resumed.  @ 9733, pt again complaining of shortness of breath, drowsiness, and slurring speech. BP was 89/52, then 82/48. Gave small ns bolus, increased trendenelenberg, UF paused briefly. BP 117/57, tx resumed.  BP recheck at frequent intervals maintained >90.  Continuing to monitor patient, no complaints @ this time

## 2021-02-21 NOTE — Discharge Summary (Signed)
Physician Discharge Summary  Richard [name] KGU:[id number] DOB: Jul 16, 1956 DOA: 02/17/2021  PCP: [name], MD  Admit date: 02/17/2021 Discharge date: 02/21/2021  Admitted From: SNF Disposition:  SNF  Recommendations for Outpatient Follow-up:  1. Follow up with PCP in 1-2 weeks 2. Please obtain BMP/CBC in one week Please follow up with nephrology as recommended.   Discharge Condition: stable.  CODE STATUS:FULL CODE. Diet recommendation: Heart Healthy  Brief/Interim Summary:  Richard [name] a65 year old male with past medical history significant for ESRD on HD, essential hypertension, DM2, HIV, seizure disorder who presented to [name] ED with progressive shortness of breath.  Patient was admitted for fluid overload secondary to noncompliance with hemodialysis.   Discharge Diagnoses:  Active Problems:   GERD (gastroesophageal reflux disease)   DM2 (diabetes mellitus, type 2) (HCC)   AIDS (acquired immune deficiency syndrome) ([place])   Hypertensive urgency   Dialysis patient, noncompliant (HCC)   Elevated troponin   Acute on chronic diastolic CHF (congestive heart failure) (HCC)   Depression   Acute hypoxic respiratory failure secondary to acute on chronic diastolic heart failure, currently requiring up to 2 L of nasal cannula oxygen with elevated BNP and chest x-ray findings of pulmonary edema Secondary to noncompliance with hemodialysis Echocardiogram showed left ventricular ejection fraction of 50 to 55% without any regional wall abnormalities Continue with hemodialysis for volume management Currently he is weaned off and on room air at this time. No new complaints today    Hypertensive urgency Probably secondary to fluid overload Resolved Continue with clonidine and Coreg.  bp  Parameters appear to be optimal     Elevated troponin secondary to demand ischemia in the setting of volume overload and hypertensive urgency EKG does  not show any ischemic changes.  Patient currently denies chest pain or shortness of breath    Type 2 diabetes mellitus, insulin-dependent Continue with Lantus  CBG (last 3)  Recent Labs    02/20/21 1603 02/20/21 2155 02/21/21 1223  GLUCAP 115* 188* 117*        History of seizure disorder Continue with Keppra 500 mg twice daily.    HIV Continue with Biktarvy and Bactrim    Hyperlipidemia resume pravastatin 80 mg   History of CVA  continue with Plavix and statin     Discharge Instructions  Discharge Instructions    Diet - low sodium heart healthy   Complete by: As directed    Increase activity slowly   Complete by: As directed      Allergies as of 02/21/2021      Reactions   Oxycodone Nausea And Vomiting      Medication List    TAKE these medications   Accu-Chek Aviva Plus test strip Generic drug: glucose blood 1 each by Other route See admin instructions. Use 1 test strip to test blood sugar two-three times daily What changed: Another medication with the same name was changed. Make sure you understand how and when to take each.   Accu-Chek Aviva Plus test strip Generic drug: glucose blood USE 1 strip TO test blood sugar 2 TIMES DAILY TO 3 TIMES DAILY What changed: See the new instructions.   acetaminophen 325 MG tablet Commonly known as: TYLENOL Take 650 mg by mouth every 6 (six) hours as needed for moderate pain, fever or mild pain.   bictegravir-emtricitabine-tenofovir AF 50-200-25 MG Tabs tablet Commonly known as: BIKTARVY Take 1 tablet by mouth daily.   blood glucose meter kit and supplies Kit Dispense based  on patient and insurance preference. Check blood sugar 4 times a day What changed:   how much to take  how to take this  when to take this   carvedilol 12.5 MG tablet Commonly known as: COREG Take 12.5 mg by mouth in the morning and at bedtime.   cetirizine 10 MG tablet Commonly known as: ZYRTEC Take 10 mg by  mouth daily.   cloNIDine 0.1 MG tablet Commonly known as: CATAPRES Take 1 tablet (0.1 mg total) by mouth 3 (three) times daily.   clopidogrel 75 MG tablet Commonly known as: PLAVIX Take 1 tablet (75 mg total) by mouth daily.   Commode 3-In-1 Misc Use as directed   Transfer Bench Misc Use as directed   Darbepoetin Alfa 60 MCG/0.3ML Sosy injection Commonly known as: ARANESP Inject 0.3 mLs (60 mcg total) into the vein every Saturday with hemodialysis.   doxepin 75 MG capsule Commonly known as: SINEQUAN Take 75 mg by mouth at bedtime.   EMOLLIENT EX Apply 1 application topically See admin instructions. Apply emollient lotion topically to arms and legs  one time daily for dry skin   feeding supplement (NEPRO CARB STEADY) Liqd Take 237 mLs by mouth 3 (three) times daily before meals.   insulin glargine 100 unit/mL Sopn Commonly known as: LANTUS Inject 5 Units into the skin at bedtime.   Insulin Pen Needle 31G X 8 MM Misc Commonly known as: Sure Comfort Pen Needles Use as directed twice daily with Novolog flex pen What changed:   how much to take  how to take this  when to take this   levETIRAcetam 500 MG tablet Commonly known as: KEPPRA Take 500 mg by mouth in the morning and at bedtime.   multivitamin with minerals Tabs tablet Take 1 tablet by mouth at bedtime.   ondansetron 4 MG tablet Commonly known as: ZOFRAN Take 4 mg by mouth every 4 (four) hours as needed for nausea/vomiting.   pantoprazole 40 MG tablet Commonly known as: PROTONIX TAKE 1 TABLET BY MOUTH EVERY DAY   pravastatin 80 MG tablet Commonly known as: PRAVACHOL Take 1 tablet (80 mg total) by mouth at bedtime.   sulfamethoxazole-trimethoprim 800-160 MG tablet Commonly known as: BACTRIM DS Take 1 tablet by mouth 3 (three) times a week. What changed: when to take this   tamsulosin 0.4 MG Caps capsule Commonly known as: FLOMAX TAKE 1 CAPSULE BY MOUTH EVERY DAY   triamcinolone cream 0.1  % Commonly known as: KENALOG Apply 1 application topically 2 (two) times daily.   TRUEplus Insulin Syringe 31G X 5/16" 1 ML Misc Generic drug: Insulin Syringe-Needle U-100 Use pen needle with insulin 2 times daily What changed: See the new instructions.   Vitamin D 50 MCG (2000 UT) tablet Take 1 tablet (2,000 Units total) by mouth in the morning.       Allergies  Allergen Reactions  . Oxycodone Nausea And Vomiting    Consultations:  nephrology   Procedures/Studies: DG Chest 2 View  Result Date: 02/17/2021 CLINICAL DATA:  Shortness of breath EXAM: CHEST - 2 VIEW COMPARISON:  01/10/2021 FINDINGS: Increased interstitial markings, suggesting mild interstitial edema. However, there are no pleural effusions. No pneumothorax. Heart is top-normal in size. Prior right IJ dual lumen dialysis catheter has been removed. IMPRESSION: Suspected mild interstitial edema.  No pleural effusions. Electronically Signed   By: Julian Hy M.D.   On: 02/17/2021 05:18   ECHOCARDIOGRAM COMPLETE  Result Date: 02/17/2021    ECHOCARDIOGRAM REPORT  Patient Name:   TAELON BENDORF Date of Exam: 02/17/2021 Medical Rec #:  [id number]             Height:       72.0 in Accession #:    [id number]            Weight:       148.4 lb Date of Birth:  [birth year]             BSA:          1.877 m Patient Age:    76 years              BP:           89/48 mmHg Patient Gender: M                     HR:           82 bpm. Exam Location:  Inpatient Procedure: 2D Echo, Cardiac Doppler and Color Doppler Indications:    CHF-Acute Systolic H20.94  History:        Patient has prior history of Echocardiogram examinations, most                 recent 12/05/2019. Risk Factors:Hypertension, Diabetes and                 Dyslipidemia. AIDS (acquired immune deficiency syndrome) ([place]),                 COVID-19, ESRD (end stage renal disease), Dialysis patient,                 noncompliant.  Sonographer:    Alvino Chapel RCS Referring  Phys: [id number] [place]  1. Left ventricular ejection fraction, by estimation, is 50 to 55%. The left ventricle has low normal function. The left ventricle has no regional wall motion abnormalities. The left ventricular internal cavity size was mildly dilated. There is moderate  concentric left ventricular hypertrophy. Left ventricular diastolic parameters are consistent with Grade II diastolic dysfunction (pseudonormalization). Elevated left ventricular end-diastolic pressure.  2. Right ventricular systolic function is normal. The right ventricular size is normal. Tricuspid regurgitation signal is inadequate for assessing PA pressure.  3. Left atrial size was severely dilated.  4. Right atrial size was moderately dilated.  5. The mitral valve is normal in structure. Trivial mitral valve regurgitation.  6. The aortic valve is tricuspid. There is mild calcification of the aortic valve. There is mild thickening of the aortic valve. Aortic valve regurgitation is not visualized. Mild aortic valve sclerosis is present, with no evidence of aortic valve stenosis.  7. The inferior vena cava is dilated in size with >50% respiratory variability, suggesting right atrial pressure of 8 mmHg. Comparison(s): No significant change from prior study. Prior images reviewed side by side. FINDINGS  Left Ventricle: Left ventricular ejection fraction, by estimation, is 50 to 55%. The left ventricle has low normal function. The left ventricle has no regional wall motion abnormalities. The left ventricular internal cavity size was mildly dilated. There is moderate concentric left ventricular hypertrophy. Left ventricular diastolic parameters are consistent with Grade II diastolic dysfunction (pseudonormalization). Elevated left ventricular end-diastolic pressure. Right Ventricle: The right ventricular size is normal. No increase in right ventricular wall thickness. Right ventricular systolic function is normal. Tricuspid  regurgitation signal is inadequate for assessing PA pressure. Left Atrium: Left atrial size was severely dilated. Right Atrium: Right atrial size was moderately dilated. Pericardium:  Trivial pericardial effusion is present. Mitral Valve: The mitral valve is normal in structure. There is mild thickening of the mitral valve leaflet(s). There is mild calcification of the mitral valve leaflet(s). Trivial mitral valve regurgitation. Tricuspid Valve: The tricuspid valve is normal in structure. Tricuspid valve regurgitation is mild. Aortic Valve: The aortic valve is tricuspid. There is mild calcification of the aortic valve. There is mild thickening of the aortic valve. Aortic valve regurgitation is not visualized. Mild aortic valve sclerosis is present, with no evidence of aortic valve stenosis. Pulmonic Valve: The pulmonic valve was not well visualized. Pulmonic valve regurgitation is not visualized. Aorta: The aortic root and ascending aorta are structurally normal, with no evidence of dilitation. Venous: The inferior vena cava is dilated in size with greater than 50% respiratory variability, suggesting right atrial pressure of 8 mmHg. IAS/Shunts: The atrial septum is grossly normal.  LEFT VENTRICLE PLAX 2D LVIDd:         5.20 cm  Diastology LVIDs:         3.90 cm  LV e' medial:    5.33 cm/s LV PW:         1.70 cm  LV E/e' medial:  22.0 LV IVS:        1.50 cm  LV e' lateral:   5.87 cm/s LVOT diam:     2.10 cm  LV E/e' lateral: 19.9 LV SV:         76 LV SV Index:   40 LVOT Area:     3.46 cm  RIGHT VENTRICLE RV S prime:     13.20 cm/s TAPSE (M-mode): 2.6 cm LEFT ATRIUM              Index       RIGHT ATRIUM           Index LA diam:        4.10 cm  2.18 cm/m  RA Area:     21.00 cm LA Vol (A2C):   168.0 ml 89.52 ml/m RA Volume:   61.00 ml  32.51 ml/m LA Vol (A4C):   128.0 ml 68.21 ml/m LA Biplane Vol: 149.0 ml 79.40 ml/m  AORTIC VALVE LVOT Vmax:   99.00 cm/s LVOT Vmean:  65.000 cm/s LVOT VTI:    0.218 m  AORTA Ao Root  diam: 3.50 cm MITRAL VALVE MV Area (PHT): 3.50 cm     SHUNTS MV Decel Time: 217 msec     Systemic VTI:  0.22 m MV E velocity: 117.00 cm/s  Systemic Diam: 2.10 cm MV A velocity: 93.60 cm/s MV E/A ratio:  1.25 Buford Dresser MD Electronically signed by Buford Dresser MD Signature Date/Time: 02/17/2021/5:35:26 PM    Final        Subjective: no new complaints  Discharge Exam: Vitals:   02/21/21 1150 02/21/21 1210  BP:  132/63  Pulse: 72   Resp:    Temp:  98.7 F (37.1 C)  SpO2: 100%    Vitals:   02/21/21 1140 02/21/21 1145 02/21/21 1150 02/21/21 1210  BP:    132/63  Pulse: 71 70 72   Resp: 17 14    Temp:    98.7 F (37.1 C)  TempSrc:    Oral  SpO2: 100% 100% 100%   Weight:        General: Pt is alert, awake, not in acute distress Cardiovascular: RRR, S1/S2 +, no rubs, no gallops Respiratory: CTA bilaterally, no wheezing, no rhonchi Abdominal: Soft, NT, ND, bowel sounds +  Extremities: no edema, no cyanosis    The results of significant diagnostics from this hospitalization (including imaging, microbiology, ancillary and laboratory) are listed below for reference.     Microbiology: Recent Results (from the past 240 hour(s))  Resp Panel by RT-PCR (Flu A&B, Covid) Nasopharyngeal Swab     Status: None   Collection Time: 02/17/21  6:59 AM   Specimen: Nasopharyngeal Swab; Nasopharyngeal(NP) swabs in vial transport medium  Result Value Ref Range Status   SARS Coronavirus 2 by RT PCR NEGATIVE NEGATIVE Final    Comment: (NOTE) SARS-CoV-2 target nucleic acids are NOT DETECTED.  The SARS-CoV-2 RNA is generally detectable in upper respiratory specimens during the acute phase of infection. The lowest concentration of SARS-CoV-2 viral copies this assay can detect is 138 copies/mL. A negative result does not preclude SARS-Cov-2 infection and should not be used as the sole basis for treatment or other patient management decisions. A negative result may occur with   improper specimen collection/handling, submission of specimen other than nasopharyngeal swab, presence of viral mutation(s) within the areas targeted by this assay, and inadequate number of viral copies(<138 copies/mL). A negative result must be combined with clinical observations, patient history, and epidemiological information. The expected result is Negative.  Fact Sheet for Patients:  EntrepreneurPulse.com.au  Fact Sheet for Healthcare Providers:  IncredibleEmployment.be  This test is no t yet approved or cleared by the [place] FDA and  has been authorized for detection and/or diagnosis of SARS-CoV-2 by FDA under an Emergency Use Authorization (EUA). This EUA will remain  in effect (meaning this test can be used) for the duration of the COVID-19 declaration under Section 564(b)(1) of the Act, 21 U.S.C.section 360bbb-3(b)(1), unless the authorization is terminated  or revoked sooner.       Influenza A by PCR NEGATIVE NEGATIVE Final   Influenza B by PCR NEGATIVE NEGATIVE Final    Comment: (NOTE) The Xpert Xpress SARS-CoV-2/FLU/RSV plus assay is intended as an aid in the diagnosis of influenza from Nasopharyngeal swab specimens and should not be used as a sole basis for treatment. Nasal washings and aspirates are unacceptable for Xpert Xpress SARS-CoV-2/FLU/RSV testing.  Fact Sheet for Patients: EntrepreneurPulse.com.au  Fact Sheet for Healthcare Providers: IncredibleEmployment.be  This test is not yet approved or cleared by the [place] FDA and has been authorized for detection and/or diagnosis of SARS-CoV-2 by FDA under an Emergency Use Authorization (EUA). This EUA will remain in effect (meaning this test can be used) for the duration of the COVID-19 declaration under Section 564(b)(1) of the Act, 21 U.S.C. section 360bbb-3(b)(1), unless the authorization is terminated  or revoked.  Performed at [facility], [place] [address] [place], [place] [address]   SARS CORONAVIRUS 2 (TAT 6-24 HRS) Nasopharyngeal Nasopharyngeal Swab     Status: None   Collection Time: 02/20/21 10:51 AM   Specimen: Nasopharyngeal Swab  Result Value Ref Range Status   SARS Coronavirus 2 NEGATIVE NEGATIVE Final    Comment: (NOTE) SARS-CoV-2 target nucleic acids are NOT DETECTED.  The SARS-CoV-2 RNA is generally detectable in upper and lower respiratory specimens during the acute phase of infection. Negative results do not preclude SARS-CoV-2 infection, do not rule out co-infections with other pathogens, and should not be used as the sole basis for treatment or other patient management decisions. Negative results must be combined with clinical observations, patient history, and epidemiological information. The expected result is Negative.  Fact Sheet for Patients: SugarRoll.be  Fact Sheet for Healthcare Providers:  https://www.woods-mathews.com/  This test is not yet approved or cleared by the [place] and  has been authorized for detection and/or diagnosis of SARS-CoV-2 by FDA under an Emergency Use Authorization (EUA). This EUA will remain  in effect (meaning this test can be used) for the duration of the COVID-19 declaration under Se ction 564(b)(1) of the Act, 21 U.S.C. section 360bbb-3(b)(1), unless the authorization is terminated or revoked sooner.  Performed at [facility], [place] [address], [place], [place] [address]      Labs: BNP (last 3 results) Recent Labs    02/17/21 0523  BNP 419.3*   Basic Metabolic Panel: Recent Labs  Lab 02/17/21 0523 02/17/21 1140 02/19/21 0627 02/21/21 0701  NA 141  --  138 135  K 4.9  --  4.2 4.2  CL 110  --  102 101  CO2 22  --  25 25  GLUCOSE 138*  --  89 105*  BUN 54*  --  34* 41*  CREATININE 10.16* 10.37* 7.27* 6.97*  CALCIUM 8.2*  --  7.8*  8.4*  PHOS  --   --  4.4 4.7*   Liver Function Tests: Recent Labs  Lab 02/19/21 0627 02/21/21 0701  ALBUMIN 2.3* 2.4*   No results for input(s): LIPASE, AMYLASE in the last 168 hours. No results for input(s): AMMONIA in the last 168 hours. CBC: Recent Labs  Lab 02/17/21 0523 02/19/21 0627 02/19/21 1549 02/21/21 0701  WBC 8.0 2.6*  --  5.5  NEUTROABS 5.8  --   --   --   HGB 7.8* 6.9* 9.1* 8.6*  HCT 25.3* 22.0* 27.9* 27.3*  MCV 100.4* 100.9*  --  97.5  PLT 145* 155  --  141*   Cardiac Enzymes: No results for input(s): CKTOTAL, CKMB, CKMBINDEX, TROPONINI in the last 168 hours. BNP: Invalid input(s): POCBNP CBG: Recent Labs  Lab 02/20/21 0742 02/20/21 1210 02/20/21 1603 02/20/21 2155 02/21/21 1223  GLUCAP 90 135* 115* 188* 117*   D-Dimer No results for input(s): DDIMER in the last 72 hours. Hgb A1c No results for input(s): HGBA1C in the last 72 hours. Lipid Profile No results for input(s): CHOL, HDL, LDLCALC, TRIG, CHOLHDL, LDLDIRECT in the last 72 hours. Thyroid function studies No results for input(s): TSH, T4TOTAL, T3FREE, THYROIDAB in the last 72 hours.  Invalid input(s): FREET3 Anemia work up No results for input(s): VITAMINB12, FOLATE, FERRITIN, TIBC, IRON, RETICCTPCT in the last 72 hours. Urinalysis    Component Value Date/Time   COLORURINE STRAW (A) 04/11/2020 0223   APPEARANCEUR CLEAR 04/11/2020 0223   LABSPEC 1.011 04/11/2020 0223   PHURINE 5.0 04/11/2020 0223   GLUCOSEU 50 (A) 04/11/2020 0223   HGBUR SMALL (A) 04/11/2020 0223   BILIRUBINUR NEGATIVE 04/11/2020 0223   KETONESUR NEGATIVE 04/11/2020 0223   PROTEINUR >=300 (A) 04/11/2020 0223   UROBILINOGEN 0.2 11/29/2014 2003   NITRITE NEGATIVE 04/11/2020 0223   LEUKOCYTESUR NEGATIVE 04/11/2020 0223   Sepsis Labs Invalid input(s): PROCALCITONIN,  WBC,  LACTICIDVEN Microbiology Recent Results (from the past 240 hour(s))  Resp Panel by RT-PCR (Flu A&B, Covid) Nasopharyngeal Swab     Status:  None   Collection Time: 02/17/21  6:59 AM   Specimen: Nasopharyngeal Swab; Nasopharyngeal(NP) swabs in vial transport medium  Result Value Ref Range Status   SARS Coronavirus 2 by RT PCR NEGATIVE NEGATIVE Final    Comment: (NOTE) SARS-CoV-2 target nucleic acids are NOT DETECTED.  The SARS-CoV-2 RNA is generally detectable in upper respiratory specimens during  the acute phase of infection. The lowest concentration of SARS-CoV-2 viral copies this assay can detect is 138 copies/mL. A negative result does not preclude SARS-Cov-2 infection and should not be used as the sole basis for treatment or other patient management decisions. A negative result may occur with  improper specimen collection/handling, submission of specimen other than nasopharyngeal swab, presence of viral mutation(s) within the areas targeted by this assay, and inadequate number of viral copies(<138 copies/mL). A negative result must be combined with clinical observations, patient history, and epidemiological information. The expected result is Negative.  Fact Sheet for Patients:  EntrepreneurPulse.com.au  Fact Sheet for Healthcare Providers:  IncredibleEmployment.be  This test is no t yet approved or cleared by the [place] FDA and  has been authorized for detection and/or diagnosis of SARS-CoV-2 by FDA under an Emergency Use Authorization (EUA). This EUA will remain  in effect (meaning this test can be used) for the duration of the COVID-19 declaration under Section 564(b)(1) of the Act, 21 U.S.C.section 360bbb-3(b)(1), unless the authorization is terminated  or revoked sooner.       Influenza A by PCR NEGATIVE NEGATIVE Final   Influenza B by PCR NEGATIVE NEGATIVE Final    Comment: (NOTE) The Xpert Xpress SARS-CoV-2/FLU/RSV plus assay is intended as an aid in the diagnosis of influenza from Nasopharyngeal swab specimens and should not be used as a sole basis for  treatment. Nasal washings and aspirates are unacceptable for Xpert Xpress SARS-CoV-2/FLU/RSV testing.  Fact Sheet for Patients: EntrepreneurPulse.com.au  Fact Sheet for Healthcare Providers: IncredibleEmployment.be  This test is not yet approved or cleared by the [place] FDA and has been authorized for detection and/or diagnosis of SARS-CoV-2 by FDA under an Emergency Use Authorization (EUA). This EUA will remain in effect (meaning this test can be used) for the duration of the COVID-19 declaration under Section 564(b)(1) of the Act, 21 U.S.C. section 360bbb-3(b)(1), unless the authorization is terminated or revoked.  Performed at [facility], [place] [address], [place], [place] [address]   SARS CORONAVIRUS 2 (TAT 6-24 HRS) Nasopharyngeal Nasopharyngeal Swab     Status: None   Collection Time: 02/20/21 10:51 AM   Specimen: Nasopharyngeal Swab  Result Value Ref Range Status   SARS Coronavirus 2 NEGATIVE NEGATIVE Final    Comment: (NOTE) SARS-CoV-2 target nucleic acids are NOT DETECTED.  The SARS-CoV-2 RNA is generally detectable in upper and lower respiratory specimens during the acute phase of infection. Negative results do not preclude SARS-CoV-2 infection, do not rule out co-infections with other pathogens, and should not be used as the sole basis for treatment or other patient management decisions. Negative results must be combined with clinical observations, patient history, and epidemiological information. The expected result is Negative.  Fact Sheet for Patients: SugarRoll.be  Fact Sheet for Healthcare Providers: https://www.woods-mathews.com/  This test is not yet approved or cleared by the [place] FDA and  has been authorized for detection and/or diagnosis of SARS-CoV-2 by FDA under an Emergency Use Authorization (EUA). This EUA will remain  in effect (meaning  this test can be used) for the duration of the COVID-19 declaration under Se ction 564(b)(1) of the Act, 21 U.S.C. section 360bbb-3(b)(1), unless the authorization is terminated or revoked sooner.  Performed at [facility], [place] [address], [place], [place] [address]      Time coordinating discharge: 36 minutes  SIGNED:   Hosie Poisson, MD  [facility] 02/21/2021, 2:04 PM

## 2021-02-21 NOTE — Plan of Care (Signed)
  Problem: Education: Goal: Knowledge of General Education information will improve Description: Including pain rating scale, medication(s)/side effects and non-pharmacologic comfort measures 02/21/2021 1412 by Camillia Herter, RN Outcome: Progressing 02/21/2021 1043 by Camillia Herter, RN Outcome: Progressing   Problem: Health Behavior/Discharge Planning: Goal: Ability to manage health-related needs will improve 02/21/2021 1412 by Camillia Herter, RN Outcome: Progressing 02/21/2021 1043 by Camillia Herter, RN Outcome: Progressing   Problem: Clinical Measurements: Goal: Ability to maintain clinical measurements within normal limits will improve 02/21/2021 1412 by Camillia Herter, RN Outcome: Progressing 02/21/2021 1043 by Camillia Herter, RN Outcome: Progressing Goal: Diagnostic test results will improve 02/21/2021 1412 by Camillia Herter, RN Outcome: Progressing 02/21/2021 1043 by Camillia Herter, RN Outcome: Progressing Goal: Cardiovascular complication will be avoided 02/21/2021 1412 by Camillia Herter, RN Outcome: Progressing 02/21/2021 1043 by Camillia Herter, RN Outcome: Progressing   Problem: Activity: Goal: Risk for activity intolerance will decrease 02/21/2021 1412 by Camillia Herter, RN Outcome: Progressing 02/21/2021 1043 by Camillia Herter, RN Outcome: Progressing   Problem: Elimination: Goal: Will not experience complications related to bowel motility 02/21/2021 1412 by Camillia Herter, RN Outcome: Progressing 02/21/2021 1043 by Camillia Herter, RN Outcome: Progressing Goal: Will not experience complications related to urinary retention 02/21/2021 1412 by Camillia Herter, RN Outcome: Progressing 02/21/2021 1043 by Camillia Herter, RN Outcome: Progressing   Problem: Safety: Goal: Ability to remain free from injury will improve 02/21/2021 1412 by Camillia Herter, RN Outcome: Progressing 02/21/2021 1043 by Camillia Herter, RN Outcome: Progressing   Problem: Skin Integrity: Goal: Risk for impaired skin  integrity will decrease 02/21/2021 1412 by Camillia Herter, RN Outcome: Progressing 02/21/2021 1043 by Camillia Herter, RN Outcome: Progressing   Problem: Education: Goal: Ability to demonstrate management of disease process will improve 02/21/2021 1412 by Camillia Herter, RN Outcome: Progressing 02/21/2021 1043 by Camillia Herter, RN Outcome: Progressing Goal: Ability to verbalize understanding of medication therapies will improve 02/21/2021 1412 by Camillia Herter, RN Outcome: Progressing 02/21/2021 1043 by Camillia Herter, RN Outcome: Progressing Goal: Individualized Educational Video(s) 02/21/2021 1412 by Camillia Herter, RN Outcome: Progressing 02/21/2021 1043 by Camillia Herter, RN Outcome: Progressing   Problem: Activity: Goal: Capacity to carry out activities will improve 02/21/2021 1412 by Camillia Herter, RN Outcome: Progressing 02/21/2021 1043 by Camillia Herter, RN Outcome: Progressing   Problem: Cardiac: Goal: Ability to achieve and maintain adequate cardiopulmonary perfusion will improve 02/21/2021 1412 by Camillia Herter, RN Outcome: Progressing 02/21/2021 1043 by Camillia Herter, RN Outcome: Progressing

## 2021-02-21 NOTE — Progress Notes (Signed)
PT Cancellation Note  Patient Details Name: Richard [name] MRN: [id number] DOB: 06/02/1956   Cancelled Treatment:    Reason Eval/Treat Not Completed: Patient at procedure or test/unavailable (HD). Will follow-up for PT treatment as schedule permits.  Mabeline Caras, PT, DPT Acute Rehabilitation Services  Pager [phone] Office [place] 02/21/2021, 7:59 AM

## 2021-02-21 NOTE — Progress Notes (Signed)
Report given to [place], LPN @ [facility].

## 2021-02-21 NOTE — Progress Notes (Signed)
Physical Therapy Treatment Patient Details Name: Richard [name] MRN: [id number] DOB: [birth year] Today's Date: 02/21/2021    History of Present Illness Pt is a 65 y.o. male admitted from SNF on 02/17/21 with worsening SOB; suspect volume overload. Also found to have hypertensive urgency. PMH includes CVA (residual R-side weakness), ESRD on HD, HTN, DM.   PT Comments    Pt slowly progressing with mobility. Today's session focused on RUE/RLE therex (AAROM/PROM), seated EOB activity and standing trials; pt unable to achieve standing with +1 assist this session. Pt limited by generalized weakness (in addition to R-side hemiparesis), decreased activity tolerance and impaired balance strategies. Continue to recommend SNF-level therapies to maximize functional mobility and decrease caregiver burden.   Follow Up Recommendations  SNF;Supervision for mobility/OOB     Equipment Recommendations  None recommended by PT    Recommendations for Other Services       Precautions / Restrictions Precautions Precautions: Fall;Other (comment) Precaution Comments: H/o residual R-side weakness/contracture (<3/5 throughout) Restrictions Weight Bearing Restrictions: No    Mobility  Bed Mobility Overal bed mobility: Needs Assistance Bed Mobility: Supine to Sit;Sit to Supine;Rolling Rolling: Min assist   Supine to sit: Mod assist Sit to supine: Mod assist   General bed mobility comments: Pt using LLE to assist RLE to EOB, minA for RLE management; use of bed rail with modA for trunk elevation and scooting hips to EOB; modA return to supine; rolling R/L with minA, reliant on use of bed rail    Transfers Overall transfer level: Needs assistance Equipment used: 1 person hand held assist Transfers: Sit to/from Stand           General transfer comment: Performed 2x trials of sit<>stand from elevated bed height, pt unable to fully offload buttocks frmo EOB despite maxA; pt seems limited by fatigue  and fearful of falling; will require +2 for future standing attempts  Ambulation/Gait                 Stairs             Wheelchair Mobility    Modified Rankin (Stroke Patients Only)       Balance Overall balance assessment: Needs assistance Sitting-balance support: Single extremity supported Sitting balance-Leahy Scale: Poor Sitting balance - Comments: Reliant on LUE support to maintain seated balance, min-modA for trunk control without UE support                                    Cognition Arousal/Alertness: Awake/alert Behavior During Therapy: Flat affect Overall Cognitive Status: No family/caregiver present to determine baseline cognitive functioning                                 General Comments: pt following simple commands appropriately; suspect short-term memory deficits and slowed processing; likely near baseline cognition      Exercises Other Exercises Other Exercises: RUE (shoulder flex/abd, elbow flex/ext, wrist ext, fingers ext) PROM, then had pt perform AAROM - pt reports performign AAROM daily, but encouraged increased ROM; also cleaned R palm noted to be dirty/wet, educ pt on importance of cleaning skin daily and letting skin "air out" Other Exercises: RLE AROM TKE, ankle PF; AAROM knee/hip flex, ankle DF (little to no activation noted with L ankle DF), hip ABD/ADD    General Comments  Pertinent Vitals/Pain Pain Assessment: Faces Faces Pain Scale: Hurts a little bit Pain Location: R knee and elbow with AAROM/PROM Pain Descriptors / Indicators: Discomfort Pain Intervention(s): Monitored during session    Home Living                      Prior Function            PT Goals (current goals can now be found in the care plan section) Progress towards PT goals: Progressing toward goals (slowly)    Frequency    Min 2X/week      PT Plan Current plan remains appropriate    Co-evaluation               AM-PAC PT "6 Clicks" Mobility   Outcome Measure  Help needed turning from your back to your side while in a flat bed without using bedrails?: A Little Help needed moving from lying on your back to sitting on the side of a flat bed without using bedrails?: A Lot Help needed moving to and from a bed to a chair (including a wheelchair)?: A Lot Help needed standing up from a chair using your arms (e.g., wheelchair or bedside chair)?: Total Help needed to walk in hospital room?: Total Help needed climbing 3-5 steps with a railing? : Total 6 Click Score: 10    End of Session Equipment Utilized During Treatment: Gait belt Activity Tolerance: Patient tolerated treatment well;Patient limited by fatigue Patient left: in bed;with call bell/phone within reach;with bed alarm set Nurse Communication: Mobility status PT Visit Diagnosis: Unsteadiness on feet (R26.81);Muscle weakness (generalized) (M62.81);Difficulty in walking, not elsewhere classified (R26.2)     Time: [id number] PT Time Calculation (min) (ACUTE ONLY): 30 min  Charges:  $Therapeutic Exercise: 8-22 mins $Therapeutic Activity: 8-22 mins                     Richard [name], PT, DPT Acute Rehabilitation Services  Pager [phone] Office [phone]  Richard [name] 02/21/2021, 5:40 PM

## 2021-02-21 NOTE — TOC Transition Note (Addendum)
Transition of Care [facility]) - CM/SW Discharge Note   Patient Details  Name: Richard [name] MRN: [id number] Date of Birth: 05/30/1956  [facility]) CM/SW Contact:  Bethann Berkshire, [place] Phone Number: 02/21/2021, 2:56 PM   Clinical Narrative:     Spoke with Antoinnette at [facility]. She stated she is still working on [place] but that pt can come today. She stated CSW could arrange transport for DC. CSW notified MD, RN, and pt.  Patient will DC to: [place] Anticipated DC date: 02/21/21 Transport by: Corey Harold   Per MD patient ready for DC to [facility]. RN, patient, and facility notified of DC. Discharge Summary and FL2 sent to facility. RN to call report prior to discharge [phone]). DC packet on chart. Ambulance transport requested for patient.   CSW will sign off for now as social work intervention is no longer needed. Please consult [place] again if new needs arise.   Final next level of care: [place] Barriers to Discharge: No Barriers Identified   Patient Goals and CMS Choice Patient states their goals for this hospitalization and ongoing recovery are:: Pt not agreeable to returning to SNF, would like to find housing. CMS Medicare.gov Compare Post Acute Care list provided to:: Patient    Discharge Placement              Patient chooses bed at:  [facility]) Patient to be transferred to facility by: [facility]   Patient and family notified of of transfer: 02/21/21  Discharge Plan and Services                                     Social Determinants of Health (SDOH) Interventions     Readmission Risk Interventions Readmission Risk Prevention Plan 02/15/2020  Transportation Screening Complete  PCP or Specialist Appt within 5-7 Days Not Complete  Not Complete comments pending disposition  Home Care Screening Complete  Medication Review (RN CM) Referral to Pharmacy  Some recent data might be hidden

## 2021-02-21 NOTE — Progress Notes (Addendum)
Pt completed dialysis treatment. Unable to reach goal of 4L due to hypotensive episodes. Pt positioned trendelenberg, goal lowered slow at first, and even received a small bolus of ns. Nevertheless, his pressures remained below ordered parameters when UF resumed, hence UF was off for last portion of treatment after interventions. MD informed Total removed by end calculated to be 2044 cc.  Pt stable, a/o per baseline, no complaints, discomfort or distress noted.   Report provided to Delila Spence, RN, and pt transported back to floor on monitor

## 2021-02-25 DIAGNOSIS — N186 End stage renal disease: Secondary | ICD-10-CM | POA: Diagnosis not present

## 2021-02-25 DIAGNOSIS — R2689 Other abnormalities of gait and mobility: Secondary | ICD-10-CM | POA: Diagnosis not present

## 2021-02-25 DIAGNOSIS — M6281 Muscle weakness (generalized): Secondary | ICD-10-CM | POA: Diagnosis not present

## 2021-02-25 DIAGNOSIS — N529 Male erectile dysfunction, unspecified: Secondary | ICD-10-CM | POA: Diagnosis not present

## 2021-02-25 DIAGNOSIS — Z20828 Contact with and (suspected) exposure to other viral communicable diseases: Secondary | ICD-10-CM | POA: Diagnosis not present

## 2021-02-25 DIAGNOSIS — F445 Conversion disorder with seizures or convulsions: Secondary | ICD-10-CM | POA: Diagnosis not present

## 2021-02-25 DIAGNOSIS — I10 Essential (primary) hypertension: Secondary | ICD-10-CM | POA: Diagnosis not present

## 2021-02-25 DIAGNOSIS — B20 Human immunodeficiency virus [HIV] disease: Secondary | ICD-10-CM | POA: Diagnosis not present

## 2021-02-25 DIAGNOSIS — E119 Type 2 diabetes mellitus without complications: Secondary | ICD-10-CM | POA: Diagnosis not present

## 2021-02-25 DIAGNOSIS — I5033 Acute on chronic diastolic (congestive) heart failure: Secondary | ICD-10-CM | POA: Diagnosis not present

## 2021-02-25 DIAGNOSIS — E875 Hyperkalemia: Secondary | ICD-10-CM | POA: Diagnosis not present

## 2021-02-27 DIAGNOSIS — R2689 Other abnormalities of gait and mobility: Secondary | ICD-10-CM | POA: Diagnosis not present

## 2021-02-27 DIAGNOSIS — I5033 Acute on chronic diastolic (congestive) heart failure: Secondary | ICD-10-CM | POA: Diagnosis not present

## 2021-02-27 DIAGNOSIS — E875 Hyperkalemia: Secondary | ICD-10-CM | POA: Diagnosis not present

## 2021-02-27 DIAGNOSIS — F445 Conversion disorder with seizures or convulsions: Secondary | ICD-10-CM | POA: Diagnosis not present

## 2021-02-27 DIAGNOSIS — M6281 Muscle weakness (generalized): Secondary | ICD-10-CM | POA: Diagnosis not present

## 2021-02-28 DIAGNOSIS — Z992 Dependence on renal dialysis: Secondary | ICD-10-CM | POA: Diagnosis not present

## 2021-02-28 DIAGNOSIS — Z20828 Contact with and (suspected) exposure to other viral communicable diseases: Secondary | ICD-10-CM | POA: Diagnosis not present

## 2021-02-28 DIAGNOSIS — N2581 Secondary hyperparathyroidism of renal origin: Secondary | ICD-10-CM | POA: Diagnosis not present

## 2021-02-28 DIAGNOSIS — I509 Heart failure, unspecified: Secondary | ICD-10-CM | POA: Diagnosis not present

## 2021-02-28 DIAGNOSIS — J9601 Acute respiratory failure with hypoxia: Secondary | ICD-10-CM | POA: Diagnosis not present

## 2021-02-28 DIAGNOSIS — N186 End stage renal disease: Secondary | ICD-10-CM | POA: Diagnosis not present

## 2021-03-05 DIAGNOSIS — Z992 Dependence on renal dialysis: Secondary | ICD-10-CM | POA: Diagnosis not present

## 2021-03-05 DIAGNOSIS — R2689 Other abnormalities of gait and mobility: Secondary | ICD-10-CM | POA: Diagnosis not present

## 2021-03-05 DIAGNOSIS — I5033 Acute on chronic diastolic (congestive) heart failure: Secondary | ICD-10-CM | POA: Diagnosis not present

## 2021-03-05 DIAGNOSIS — N186 End stage renal disease: Secondary | ICD-10-CM | POA: Diagnosis not present

## 2021-03-05 DIAGNOSIS — M6281 Muscle weakness (generalized): Secondary | ICD-10-CM | POA: Diagnosis not present

## 2021-03-05 DIAGNOSIS — E875 Hyperkalemia: Secondary | ICD-10-CM | POA: Diagnosis not present

## 2021-03-05 DIAGNOSIS — F445 Conversion disorder with seizures or convulsions: Secondary | ICD-10-CM | POA: Diagnosis not present

## 2021-03-05 DIAGNOSIS — N2581 Secondary hyperparathyroidism of renal origin: Secondary | ICD-10-CM | POA: Diagnosis not present

## 2021-03-06 DIAGNOSIS — E875 Hyperkalemia: Secondary | ICD-10-CM | POA: Diagnosis not present

## 2021-03-06 DIAGNOSIS — F445 Conversion disorder with seizures or convulsions: Secondary | ICD-10-CM | POA: Diagnosis not present

## 2021-03-06 DIAGNOSIS — I5033 Acute on chronic diastolic (congestive) heart failure: Secondary | ICD-10-CM | POA: Diagnosis not present

## 2021-03-06 DIAGNOSIS — M6281 Muscle weakness (generalized): Secondary | ICD-10-CM | POA: Diagnosis not present

## 2021-03-06 DIAGNOSIS — R2689 Other abnormalities of gait and mobility: Secondary | ICD-10-CM | POA: Diagnosis not present

## 2021-03-11 DIAGNOSIS — M6281 Muscle weakness (generalized): Secondary | ICD-10-CM | POA: Diagnosis not present

## 2021-03-11 DIAGNOSIS — R2689 Other abnormalities of gait and mobility: Secondary | ICD-10-CM | POA: Diagnosis not present

## 2021-03-11 DIAGNOSIS — I5033 Acute on chronic diastolic (congestive) heart failure: Secondary | ICD-10-CM | POA: Diagnosis not present

## 2021-03-11 DIAGNOSIS — F445 Conversion disorder with seizures or convulsions: Secondary | ICD-10-CM | POA: Diagnosis not present

## 2021-03-11 DIAGNOSIS — E875 Hyperkalemia: Secondary | ICD-10-CM | POA: Diagnosis not present

## 2021-03-13 DIAGNOSIS — M6281 Muscle weakness (generalized): Secondary | ICD-10-CM | POA: Diagnosis not present

## 2021-03-13 DIAGNOSIS — I5033 Acute on chronic diastolic (congestive) heart failure: Secondary | ICD-10-CM | POA: Diagnosis not present

## 2021-03-13 DIAGNOSIS — F445 Conversion disorder with seizures or convulsions: Secondary | ICD-10-CM | POA: Diagnosis not present

## 2021-03-13 DIAGNOSIS — E875 Hyperkalemia: Secondary | ICD-10-CM | POA: Diagnosis not present

## 2021-03-13 DIAGNOSIS — R2689 Other abnormalities of gait and mobility: Secondary | ICD-10-CM | POA: Diagnosis not present

## 2021-03-14 DIAGNOSIS — N186 End stage renal disease: Secondary | ICD-10-CM | POA: Diagnosis not present

## 2021-03-14 DIAGNOSIS — N2581 Secondary hyperparathyroidism of renal origin: Secondary | ICD-10-CM | POA: Diagnosis not present

## 2021-03-14 DIAGNOSIS — Z992 Dependence on renal dialysis: Secondary | ICD-10-CM | POA: Diagnosis not present

## 2021-03-16 DIAGNOSIS — Z992 Dependence on renal dialysis: Secondary | ICD-10-CM | POA: Diagnosis not present

## 2021-03-16 DIAGNOSIS — N186 End stage renal disease: Secondary | ICD-10-CM | POA: Diagnosis not present

## 2021-03-16 DIAGNOSIS — I509 Heart failure, unspecified: Secondary | ICD-10-CM | POA: Diagnosis not present

## 2021-03-16 DIAGNOSIS — N400 Enlarged prostate without lower urinary tract symptoms: Secondary | ICD-10-CM | POA: Diagnosis not present

## 2021-03-16 DIAGNOSIS — E785 Hyperlipidemia, unspecified: Secondary | ICD-10-CM | POA: Diagnosis not present

## 2021-03-16 DIAGNOSIS — E119 Type 2 diabetes mellitus without complications: Secondary | ICD-10-CM | POA: Diagnosis not present

## 2021-03-16 DIAGNOSIS — I10 Essential (primary) hypertension: Secondary | ICD-10-CM | POA: Diagnosis not present

## 2021-03-16 DIAGNOSIS — E559 Vitamin D deficiency, unspecified: Secondary | ICD-10-CM | POA: Diagnosis not present

## 2021-03-16 DIAGNOSIS — N2581 Secondary hyperparathyroidism of renal origin: Secondary | ICD-10-CM | POA: Diagnosis not present

## 2021-03-19 DIAGNOSIS — I5033 Acute on chronic diastolic (congestive) heart failure: Secondary | ICD-10-CM | POA: Diagnosis not present

## 2021-03-19 DIAGNOSIS — E875 Hyperkalemia: Secondary | ICD-10-CM | POA: Diagnosis not present

## 2021-03-19 DIAGNOSIS — R2689 Other abnormalities of gait and mobility: Secondary | ICD-10-CM | POA: Diagnosis not present

## 2021-03-19 DIAGNOSIS — F445 Conversion disorder with seizures or convulsions: Secondary | ICD-10-CM | POA: Diagnosis not present

## 2021-03-19 DIAGNOSIS — M6281 Muscle weakness (generalized): Secondary | ICD-10-CM | POA: Diagnosis not present

## 2021-03-20 DIAGNOSIS — M6281 Muscle weakness (generalized): Secondary | ICD-10-CM | POA: Diagnosis not present

## 2021-03-20 DIAGNOSIS — I5033 Acute on chronic diastolic (congestive) heart failure: Secondary | ICD-10-CM | POA: Diagnosis not present

## 2021-03-20 DIAGNOSIS — R2689 Other abnormalities of gait and mobility: Secondary | ICD-10-CM | POA: Diagnosis not present

## 2021-03-20 DIAGNOSIS — F445 Conversion disorder with seizures or convulsions: Secondary | ICD-10-CM | POA: Diagnosis not present

## 2021-03-20 DIAGNOSIS — E875 Hyperkalemia: Secondary | ICD-10-CM | POA: Diagnosis not present

## 2021-03-21 DIAGNOSIS — N186 End stage renal disease: Secondary | ICD-10-CM | POA: Diagnosis not present

## 2021-03-21 DIAGNOSIS — N2581 Secondary hyperparathyroidism of renal origin: Secondary | ICD-10-CM | POA: Diagnosis not present

## 2021-03-21 DIAGNOSIS — E1122 Type 2 diabetes mellitus with diabetic chronic kidney disease: Secondary | ICD-10-CM | POA: Diagnosis not present

## 2021-03-21 DIAGNOSIS — Z992 Dependence on renal dialysis: Secondary | ICD-10-CM | POA: Diagnosis not present

## 2021-03-22 DIAGNOSIS — Z992 Dependence on renal dialysis: Secondary | ICD-10-CM | POA: Diagnosis not present

## 2021-03-22 DIAGNOSIS — N186 End stage renal disease: Secondary | ICD-10-CM | POA: Diagnosis not present

## 2021-03-22 DIAGNOSIS — E1122 Type 2 diabetes mellitus with diabetic chronic kidney disease: Secondary | ICD-10-CM | POA: Diagnosis not present

## 2021-03-23 DIAGNOSIS — M6281 Muscle weakness (generalized): Secondary | ICD-10-CM | POA: Diagnosis not present

## 2021-03-23 DIAGNOSIS — U071 COVID-19: Secondary | ICD-10-CM | POA: Diagnosis not present

## 2021-03-23 DIAGNOSIS — I5033 Acute on chronic diastolic (congestive) heart failure: Secondary | ICD-10-CM | POA: Diagnosis not present

## 2021-03-23 DIAGNOSIS — E875 Hyperkalemia: Secondary | ICD-10-CM | POA: Diagnosis not present

## 2021-03-23 DIAGNOSIS — S72301D Unspecified fracture of shaft of right femur, subsequent encounter for closed fracture with routine healing: Secondary | ICD-10-CM | POA: Diagnosis not present

## 2021-03-23 DIAGNOSIS — F445 Conversion disorder with seizures or convulsions: Secondary | ICD-10-CM | POA: Diagnosis not present

## 2021-03-23 DIAGNOSIS — J9621 Acute and chronic respiratory failure with hypoxia: Secondary | ICD-10-CM | POA: Diagnosis not present

## 2021-03-23 DIAGNOSIS — R2689 Other abnormalities of gait and mobility: Secondary | ICD-10-CM | POA: Diagnosis not present

## 2021-03-25 DIAGNOSIS — M6281 Muscle weakness (generalized): Secondary | ICD-10-CM | POA: Diagnosis not present

## 2021-03-25 DIAGNOSIS — U071 COVID-19: Secondary | ICD-10-CM | POA: Diagnosis not present

## 2021-03-25 DIAGNOSIS — I5033 Acute on chronic diastolic (congestive) heart failure: Secondary | ICD-10-CM | POA: Diagnosis not present

## 2021-03-25 DIAGNOSIS — S72301D Unspecified fracture of shaft of right femur, subsequent encounter for closed fracture with routine healing: Secondary | ICD-10-CM | POA: Diagnosis not present

## 2021-03-25 DIAGNOSIS — E875 Hyperkalemia: Secondary | ICD-10-CM | POA: Diagnosis not present

## 2021-03-25 DIAGNOSIS — J9621 Acute and chronic respiratory failure with hypoxia: Secondary | ICD-10-CM | POA: Diagnosis not present

## 2021-03-25 DIAGNOSIS — F445 Conversion disorder with seizures or convulsions: Secondary | ICD-10-CM | POA: Diagnosis not present

## 2021-03-25 DIAGNOSIS — R2689 Other abnormalities of gait and mobility: Secondary | ICD-10-CM | POA: Diagnosis not present

## 2021-03-26 DIAGNOSIS — I5033 Acute on chronic diastolic (congestive) heart failure: Secondary | ICD-10-CM | POA: Diagnosis not present

## 2021-03-26 DIAGNOSIS — N186 End stage renal disease: Secondary | ICD-10-CM | POA: Diagnosis not present

## 2021-03-26 DIAGNOSIS — E875 Hyperkalemia: Secondary | ICD-10-CM | POA: Diagnosis not present

## 2021-03-26 DIAGNOSIS — F445 Conversion disorder with seizures or convulsions: Secondary | ICD-10-CM | POA: Diagnosis not present

## 2021-03-26 DIAGNOSIS — N2581 Secondary hyperparathyroidism of renal origin: Secondary | ICD-10-CM | POA: Diagnosis not present

## 2021-03-26 DIAGNOSIS — Z992 Dependence on renal dialysis: Secondary | ICD-10-CM | POA: Diagnosis not present

## 2021-03-26 DIAGNOSIS — R2689 Other abnormalities of gait and mobility: Secondary | ICD-10-CM | POA: Diagnosis not present

## 2021-03-26 DIAGNOSIS — M6281 Muscle weakness (generalized): Secondary | ICD-10-CM | POA: Diagnosis not present

## 2021-03-26 DIAGNOSIS — S72301D Unspecified fracture of shaft of right femur, subsequent encounter for closed fracture with routine healing: Secondary | ICD-10-CM | POA: Diagnosis not present

## 2021-03-26 DIAGNOSIS — J9621 Acute and chronic respiratory failure with hypoxia: Secondary | ICD-10-CM | POA: Diagnosis not present

## 2021-03-26 DIAGNOSIS — U071 COVID-19: Secondary | ICD-10-CM | POA: Diagnosis not present

## 2021-03-27 DIAGNOSIS — E875 Hyperkalemia: Secondary | ICD-10-CM | POA: Diagnosis not present

## 2021-03-27 DIAGNOSIS — I5033 Acute on chronic diastolic (congestive) heart failure: Secondary | ICD-10-CM | POA: Diagnosis not present

## 2021-03-27 DIAGNOSIS — F445 Conversion disorder with seizures or convulsions: Secondary | ICD-10-CM | POA: Diagnosis not present

## 2021-03-27 DIAGNOSIS — S72301D Unspecified fracture of shaft of right femur, subsequent encounter for closed fracture with routine healing: Secondary | ICD-10-CM | POA: Diagnosis not present

## 2021-03-27 DIAGNOSIS — U071 COVID-19: Secondary | ICD-10-CM | POA: Diagnosis not present

## 2021-03-27 DIAGNOSIS — M6281 Muscle weakness (generalized): Secondary | ICD-10-CM | POA: Diagnosis not present

## 2021-03-27 DIAGNOSIS — J9621 Acute and chronic respiratory failure with hypoxia: Secondary | ICD-10-CM | POA: Diagnosis not present

## 2021-03-27 DIAGNOSIS — R2689 Other abnormalities of gait and mobility: Secondary | ICD-10-CM | POA: Diagnosis not present

## 2021-03-28 DIAGNOSIS — S72301D Unspecified fracture of shaft of right femur, subsequent encounter for closed fracture with routine healing: Secondary | ICD-10-CM | POA: Diagnosis not present

## 2021-03-28 DIAGNOSIS — M6281 Muscle weakness (generalized): Secondary | ICD-10-CM | POA: Diagnosis not present

## 2021-03-28 DIAGNOSIS — N186 End stage renal disease: Secondary | ICD-10-CM | POA: Diagnosis not present

## 2021-03-28 DIAGNOSIS — I10 Essential (primary) hypertension: Secondary | ICD-10-CM | POA: Diagnosis not present

## 2021-03-28 DIAGNOSIS — E875 Hyperkalemia: Secondary | ICD-10-CM | POA: Diagnosis not present

## 2021-03-28 DIAGNOSIS — E119 Type 2 diabetes mellitus without complications: Secondary | ICD-10-CM | POA: Diagnosis not present

## 2021-03-28 DIAGNOSIS — U071 COVID-19: Secondary | ICD-10-CM | POA: Diagnosis not present

## 2021-03-28 DIAGNOSIS — R2689 Other abnormalities of gait and mobility: Secondary | ICD-10-CM | POA: Diagnosis not present

## 2021-03-28 DIAGNOSIS — I5033 Acute on chronic diastolic (congestive) heart failure: Secondary | ICD-10-CM | POA: Diagnosis not present

## 2021-03-28 DIAGNOSIS — J9621 Acute and chronic respiratory failure with hypoxia: Secondary | ICD-10-CM | POA: Diagnosis not present

## 2021-03-28 DIAGNOSIS — F445 Conversion disorder with seizures or convulsions: Secondary | ICD-10-CM | POA: Diagnosis not present

## 2021-03-29 DIAGNOSIS — B20 Human immunodeficiency virus [HIV] disease: Secondary | ICD-10-CM | POA: Diagnosis not present

## 2021-03-29 DIAGNOSIS — I5033 Acute on chronic diastolic (congestive) heart failure: Secondary | ICD-10-CM | POA: Diagnosis not present

## 2021-03-29 DIAGNOSIS — S72301D Unspecified fracture of shaft of right femur, subsequent encounter for closed fracture with routine healing: Secondary | ICD-10-CM | POA: Diagnosis not present

## 2021-03-29 DIAGNOSIS — E875 Hyperkalemia: Secondary | ICD-10-CM | POA: Diagnosis not present

## 2021-03-29 DIAGNOSIS — R569 Unspecified convulsions: Secondary | ICD-10-CM | POA: Diagnosis not present

## 2021-03-29 DIAGNOSIS — R2689 Other abnormalities of gait and mobility: Secondary | ICD-10-CM | POA: Diagnosis not present

## 2021-03-29 DIAGNOSIS — M6281 Muscle weakness (generalized): Secondary | ICD-10-CM | POA: Diagnosis not present

## 2021-03-29 DIAGNOSIS — K219 Gastro-esophageal reflux disease without esophagitis: Secondary | ICD-10-CM | POA: Diagnosis not present

## 2021-03-29 DIAGNOSIS — E785 Hyperlipidemia, unspecified: Secondary | ICD-10-CM | POA: Diagnosis not present

## 2021-03-29 DIAGNOSIS — J9621 Acute and chronic respiratory failure with hypoxia: Secondary | ICD-10-CM | POA: Diagnosis not present

## 2021-03-29 DIAGNOSIS — E119 Type 2 diabetes mellitus without complications: Secondary | ICD-10-CM | POA: Diagnosis not present

## 2021-03-29 DIAGNOSIS — N529 Male erectile dysfunction, unspecified: Secondary | ICD-10-CM | POA: Diagnosis not present

## 2021-03-29 DIAGNOSIS — F445 Conversion disorder with seizures or convulsions: Secondary | ICD-10-CM | POA: Diagnosis not present

## 2021-03-29 DIAGNOSIS — I10 Essential (primary) hypertension: Secondary | ICD-10-CM | POA: Diagnosis not present

## 2021-03-29 DIAGNOSIS — N186 End stage renal disease: Secondary | ICD-10-CM | POA: Diagnosis not present

## 2021-03-29 DIAGNOSIS — U071 COVID-19: Secondary | ICD-10-CM | POA: Diagnosis not present

## 2021-04-02 ENCOUNTER — Emergency Department (HOSPITAL_COMMUNITY): Payer: No Typology Code available for payment source

## 2021-04-02 ENCOUNTER — Emergency Department (HOSPITAL_COMMUNITY)
Admission: EM | Admit: 2021-04-02 | Discharge: 2021-04-03 | Disposition: A | Discharge status: Home / Self Care | Payer: No Typology Code available for payment source | Attending: Emergency Medicine | Admitting: Emergency Medicine

## 2021-04-02 DIAGNOSIS — Z794 Long term (current) use of insulin: Secondary | ICD-10-CM | POA: Insufficient documentation

## 2021-04-02 DIAGNOSIS — E1140 Type 2 diabetes mellitus with diabetic neuropathy, unspecified: Secondary | ICD-10-CM | POA: Diagnosis not present

## 2021-04-02 DIAGNOSIS — E1122 Type 2 diabetes mellitus with diabetic chronic kidney disease: Secondary | ICD-10-CM | POA: Diagnosis not present

## 2021-04-02 DIAGNOSIS — F17210 Nicotine dependence, cigarettes, uncomplicated: Secondary | ICD-10-CM | POA: Insufficient documentation

## 2021-04-02 DIAGNOSIS — Z20822 Contact with and (suspected) exposure to covid-19: Secondary | ICD-10-CM | POA: Insufficient documentation

## 2021-04-02 DIAGNOSIS — Z8616 Personal history of COVID-19: Secondary | ICD-10-CM | POA: Diagnosis not present

## 2021-04-02 DIAGNOSIS — Z992 Dependence on renal dialysis: Secondary | ICD-10-CM | POA: Diagnosis not present

## 2021-04-02 DIAGNOSIS — Z7902 Long term (current) use of antithrombotics/antiplatelets: Secondary | ICD-10-CM | POA: Insufficient documentation

## 2021-04-02 DIAGNOSIS — B20 Human immunodeficiency virus [HIV] disease: Secondary | ICD-10-CM | POA: Diagnosis not present

## 2021-04-02 DIAGNOSIS — I132 Hypertensive heart and chronic kidney disease with heart failure and with stage 5 chronic kidney disease, or end stage renal disease: Secondary | ICD-10-CM | POA: Insufficient documentation

## 2021-04-02 DIAGNOSIS — R000 Tachycardia, unspecified: Secondary | ICD-10-CM | POA: Diagnosis not present

## 2021-04-02 DIAGNOSIS — N186 End stage renal disease: Secondary | ICD-10-CM | POA: Diagnosis not present

## 2021-04-02 DIAGNOSIS — I5033 Acute on chronic diastolic (congestive) heart failure: Secondary | ICD-10-CM | POA: Insufficient documentation

## 2021-04-02 DIAGNOSIS — Z79899 Other long term (current) drug therapy: Secondary | ICD-10-CM | POA: Diagnosis not present

## 2021-04-02 DIAGNOSIS — R0602 Shortness of breath: Secondary | ICD-10-CM | POA: Diagnosis not present

## 2021-04-02 DIAGNOSIS — R0902 Hypoxemia: Secondary | ICD-10-CM | POA: Diagnosis not present

## 2021-04-02 DIAGNOSIS — R069 Unspecified abnormalities of breathing: Secondary | ICD-10-CM | POA: Diagnosis not present

## 2021-04-02 DIAGNOSIS — E875 Hyperkalemia: Secondary | ICD-10-CM

## 2021-04-02 LAB — CBC WITH DIFFERENTIAL/PLATELET
Abs Immature Granulocytes: 0.02 10*3/uL (ref 0.00–0.07)
Basophils Absolute: 0.1 10*3/uL (ref 0.0–0.1)
Basophils Relative: 1 %
Eosinophils Absolute: 0.2 10*3/uL (ref 0.0–0.5)
Eosinophils Relative: 3 %
HCT: 37.6 % — ABNORMAL LOW (ref 39.0–52.0)
Hemoglobin: 11.7 g/dL — ABNORMAL LOW (ref 13.0–17.0)
Immature Granulocytes: 0 %
Lymphocytes Relative: 22 %
Lymphs Abs: 1.4 10*3/uL (ref 0.7–4.0)
MCH: 31.5 pg (ref 26.0–34.0)
MCHC: 31.1 g/dL (ref 30.0–36.0)
MCV: 101.3 fL — ABNORMAL HIGH (ref 80.0–100.0)
Monocytes Absolute: 0.5 10*3/uL (ref 0.1–1.0)
Monocytes Relative: 8 %
Neutro Abs: 4.1 10*3/uL (ref 1.7–7.7)
Neutrophils Relative %: 66 %
Platelets: 159 10*3/uL (ref 150–400)
RBC: 3.71 MIL/uL — ABNORMAL LOW (ref 4.22–5.81)
RDW: 15.3 % (ref 11.5–15.5)
WBC: 6.1 10*3/uL (ref 4.0–10.5)
nRBC: 0 % (ref 0.0–0.2)

## 2021-04-02 LAB — I-STAT CHEM 8, ED
BUN: 100 mg/dL — ABNORMAL HIGH (ref 8–23)
Calcium, Ion: 0.9 mmol/L — ABNORMAL LOW (ref 1.15–1.40)
Chloride: 113 mmol/L — ABNORMAL HIGH (ref 98–111)
Creatinine, Ser: 15.6 mg/dL — ABNORMAL HIGH (ref 0.61–1.24)
Glucose, Bld: 118 mg/dL — ABNORMAL HIGH (ref 70–99)
HCT: 39 % (ref 39.0–52.0)
Hemoglobin: 13.3 g/dL (ref 13.0–17.0)
Potassium: 6.1 mmol/L — ABNORMAL HIGH (ref 3.5–5.1)
Sodium: 138 mmol/L (ref 135–145)
TCO2: 18 mmol/L — ABNORMAL LOW (ref 22–32)

## 2021-04-02 LAB — BASIC METABOLIC PANEL
Anion gap: 18 — ABNORMAL HIGH (ref 5–15)
BUN: 82 mg/dL — ABNORMAL HIGH (ref 8–23)
CO2: 14 mmol/L — ABNORMAL LOW (ref 22–32)
Calcium: 8 mg/dL — ABNORMAL LOW (ref 8.9–10.3)
Chloride: 108 mmol/L (ref 98–111)
Creatinine, Ser: 13.58 mg/dL — ABNORMAL HIGH (ref 0.61–1.24)
GFR, Estimated: 4 mL/min — ABNORMAL LOW (ref >60)
Glucose, Bld: 97 mg/dL (ref 70–99)
Potassium: 5.5 mmol/L — ABNORMAL HIGH (ref 3.5–5.1)
Sodium: 140 mmol/L (ref 135–145)

## 2021-04-02 LAB — CBC
HCT: 34.7 % — ABNORMAL LOW (ref 39.0–52.0)
Hemoglobin: 11.3 g/dL — ABNORMAL LOW (ref 13.0–17.0)
MCH: 31.7 pg (ref 26.0–34.0)
MCHC: 32.6 g/dL (ref 30.0–36.0)
MCV: 97.2 fL (ref 80.0–100.0)
Platelets: 206 10*3/uL (ref 150–400)
RBC: 3.57 MIL/uL — ABNORMAL LOW (ref 4.22–5.81)
RDW: 15 % (ref 11.5–15.5)
WBC: 6.3 10*3/uL (ref 4.0–10.5)
nRBC: 0 % (ref 0.0–0.2)

## 2021-04-02 LAB — RENAL FUNCTION PANEL
Albumin: 3.1 g/dL — ABNORMAL LOW (ref 3.5–5.0)
Anion gap: 20 — ABNORMAL HIGH (ref 5–15)
BUN: 83 mg/dL — ABNORMAL HIGH (ref 8–23)
CO2: 17 mmol/L — ABNORMAL LOW (ref 22–32)
Calcium: 8.1 mg/dL — ABNORMAL LOW (ref 8.9–10.3)
Chloride: 105 mmol/L (ref 98–111)
Creatinine, Ser: 13.58 mg/dL — ABNORMAL HIGH (ref 0.61–1.24)
GFR, Estimated: 4 mL/min — ABNORMAL LOW (ref >60)
Glucose, Bld: 90 mg/dL (ref 70–99)
Phosphorus: 10 mg/dL — ABNORMAL HIGH (ref 2.5–4.6)
Potassium: 4.8 mmol/L (ref 3.5–5.1)
Sodium: 142 mmol/L (ref 135–145)

## 2021-04-02 LAB — RESP PANEL BY RT-PCR (FLU A&B, COVID) ARPGX2
Influenza A by PCR: NEGATIVE
Influenza B by PCR: NEGATIVE
SARS Coronavirus 2 by RT PCR: NEGATIVE

## 2021-04-02 LAB — HEPATITIS B SURFACE ANTIGEN: Hepatitis B Surface Ag: NONREACTIVE

## 2021-04-02 MED ORDER — LIDOCAINE-PRILOCAINE 2.5-2.5 % EX CREA
1.0000 "application " | TOPICAL_CREAM | CUTANEOUS | Status: DC | PRN
  Filled 2021-04-02: qty 5

## 2021-04-02 MED ORDER — HEPARIN SODIUM (PORCINE) 1000 UNIT/ML DIALYSIS
1000.0000 [IU] | INTRAMUSCULAR | Status: DC | PRN
  Filled 2021-04-02: qty 1

## 2021-04-02 MED ORDER — SODIUM CHLORIDE 0.9 % IV SOLN: 100.0000 mL | INTRAVENOUS | Status: DC | PRN

## 2021-04-02 MED ORDER — CALCIUM GLUCONATE 10 % IV SOLN
1.0000 g | Freq: Once | INTRAVENOUS | Status: DC
  Filled 2021-04-02: qty 10

## 2021-04-02 MED ORDER — LIDOCAINE HCL (PF) 1 % IJ SOLN: 5.0000 mL | INTRAMUSCULAR | Status: DC | PRN

## 2021-04-02 MED ORDER — NITROGLYCERIN 2 % TD OINT
1.0000 [in_us] | TOPICAL_OINTMENT | Freq: Once | TRANSDERMAL | Status: AC
  Administered 2021-04-02: 1 [in_us] via TOPICAL
  Filled 2021-04-02: qty 1

## 2021-04-02 MED ORDER — HEPARIN SODIUM (PORCINE) 1000 UNIT/ML DIALYSIS
1200.0000 [IU] | INTRAMUSCULAR | Status: DC | PRN
  Administered 2021-04-02: 1200 [IU] via INTRAVENOUS_CENTRAL
  Filled 2021-04-02 (×2): qty 2

## 2021-04-02 MED ORDER — PENTAFLUOROPROP-TETRAFLUOROETH EX AERO
1.0000 "application " | INHALATION_SPRAY | CUTANEOUS | Status: DC | PRN
  Filled 2021-04-02: qty 116

## 2021-04-02 MED ORDER — CALCIUM GLUCONATE-NACL 1-0.675 GM/50ML-% IV SOLN
1.0000 g | Freq: Once | INTRAVENOUS | Status: AC
  Administered 2021-04-02: 1000 mg via INTRAVENOUS
  Filled 2021-04-02: qty 50

## 2021-04-02 MED ORDER — CHLORHEXIDINE GLUCONATE CLOTH 2 % EX PADS: 6.0000 | MEDICATED_PAD | Freq: Every day | CUTANEOUS | Status: DC

## 2021-04-02 MED ORDER — ALTEPLASE 2 MG IJ SOLR: 2.0000 mg | Freq: Once | INTRAMUSCULAR | Status: DC | PRN

## 2021-04-02 NOTE — ED Provider Notes (Signed)
[facility] EMERGENCY DEPARTMENT Provider Note   CSN: [id number] Arrival date & time: 04/02/21  2993     History Chief Complaint  Patient presents with   Respiratory Distress    Hong Moring is a 65 y.o. male.  HPI     This is a 65 year old male with a history of end-stage renal disease on dialysis Tuesday, Thursday, Saturday, hypertension, stroke, diabetes who presents with shortness of breath.  Patient reports that he got very hot in his facility.  He had onset of shortness of breath.  Facility reported O2 sats of 90%.  He was course on EMS evaluation.  He was given a DuoNeb in route.  Heart rate was noted to be elevated.  He refused blood pressure.  Patient reports that he dialyzed on Saturday as scheduled.  He is only complaining of shortness of breath.  No recent fevers or cough.  Denies chest pain.  Past Medical History:  Diagnosis Date   Arthritis    Diabetes mellitus, type II, insulin dependent (HCC)    GERD (gastroesophageal reflux disease)    Hypertension    Hypertensive emergency 05/21/2012   Immune deficiency disorder ([place])    Migraines    Neuropathy in diabetes ([place])    bilat feet   Ruptured lumbar disc    Stroke ([place]) 2013   residual right sided weakness (arm>leg)    Patient Active Problem List   Diagnosis Date Noted   Elevated troponin 02/18/2021   Acute on chronic diastolic CHF (congestive heart failure) ([place]) 02/18/2021   Depression 02/18/2021   Hemorrhoids 01/11/2021   Hyperkalemia 01/10/2021   Dialysis patient, noncompliant ([place])    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   Syncope 06/01/2020   Secondary hyperparathyroidism of renal origin ([place]) 05/28/2020   COVID-19 05/16/2020   Hypokalemia 05/16/2020   Allergy, unspecified, initial encounter 05/08/2020   Anaphylactic shock, unspecified, initial encounter 05/08/2020   Anemia in chronic kidney disease 71/69/6789   Complication of vascular dialysis  catheter 05/08/2020   Dependence on renal dialysis ([place]) 05/08/2020   Headache, unspecified 05/08/2020   Iron deficiency anemia, unspecified 05/08/2020   Cerebellar stroke syndrome 05/07/2020   Encounter for feeding tube placement    Weakness    Creatinine elevation    Goals of care, counseling/discussion    Hypertensive urgency    Meningoencephalitis    Palliative care by specialist    ESRD (end stage renal disease) ([place])    AIDS (acquired immune deficiency syndrome) ([place]) 04/13/2020   Encephalopathy 04/13/2020   Seizure ([place]) 04/10/2020   History of cerebrovascular accident (CVA) with residual deficit 03/19/2020   CKD (chronic kidney disease) stage 5, GFR less than 15 ml/min ([place]) 02/15/2020   Altered mental status 02/13/2020   Neuropathy in diabetes ([place])    Migraines    Hypertension    GERD (gastroesophageal reflux disease)    DM2 (diabetes mellitus, type 2) ([place])    Arthritis    Hyperlipidemia 08/25/2012   Chronic kidney disease, stage 4 (severe) ([place]) 08/23/2012   Tobacco abuse 08/23/2012   Malnutrition of moderate degree ([place]) 08/23/2012   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 05/21/2012    Past Surgical History:  Procedure Laterality Date   AV FISTULA PLACEMENT Left 04/25/2020   Procedure: LEFT ARM BRACHIOCEPHALIC ARTERIOVENOUS (AV) FISTULA CREATION;  Surgeon: Rosetta Posner, MD;  Location: [facility] OR;  Service: Vascular;  Laterality: Left;   IR FLUORO GUIDE CV LINE RIGHT  04/13/2020  IR REMOVAL TUN CV CATH W/O FL  01/14/2021   IR US GUIDE VASC ACCESS RIGHT  04/13/2020   KNEE ARTHROSCOPY Bilateral        Family History  Problem Relation Age of Onset   Other Mother    Stroke Father    Hypertension Father    Epilepsy Father    Breast cancer Sister    Stroke Brother    Heart attack Brother    Diabetes Sister     Social History   Tobacco Use   Smoking status: Every Day    Packs/day: 0.50    Pack years: 0.00    Types: Cigarettes    Smokeless tobacco: Never  Vaping Use   Vaping Use: Never used  Substance Use Topics   Alcohol use: No   Drug use: Yes    Types: Marijuana    Home Medications Prior to Admission medications   Medication Sig Start Date End Date Taking? Authorizing Provider  ACCU-CHEK AVIVA PLUS test strip 1 each by Other route See admin instructions. Use 1 test strip to test blood sugar two-three times daily 04/20/18   [provider]  ACCU-CHEK AVIVA PLUS test strip USE 1 strip TO test blood sugar 2 TIMES DAILY TO 3 TIMES DAILY Patient taking differently: 1 each by Other route in the morning, at noon, and at bedtime. 06/10/19   [name], MD  acetaminophen (TYLENOL) 325 MG tablet Take 650 mg by mouth every 6 (six) hours as needed for moderate pain, fever or mild pain.    [provider]  bictegravir-emtricitabine-tenofovir AF (BIKTARVY) 50-200-25 MG TABS tablet Take 1 tablet by mouth daily. 05/01/20   Oswald Hillock, MD  blood glucose meter kit and supplies KIT Dispense based on patient and insurance preference. Check blood sugar 4 times a day Patient taking differently: Inject 1 each into the skin See admin instructions. Dispense based on patient and insurance preference. Check blood sugar 4 times a day 06/11/18   [name], MD  carvedilol (COREG) 12.5 MG tablet Take 12.5 mg by mouth in the morning and at bedtime.    [provider]  cetirizine (ZYRTEC) 10 MG tablet Take 10 mg by mouth daily.    [provider]  Cholecalciferol (VITAMIN D) 50 MCG (2000 UT) tablet Take 1 tablet (2,000 Units total) by mouth in the morning. 03/07/20   [name], MD  cloNIDine (CATAPRES) 0.1 MG tablet Take 1 tablet (0.1 mg total) by mouth 3 (three) times daily. 02/21/21   Hosie Poisson, MD  clopidogrel (PLAVIX) 75 MG tablet Take 1 tablet (75 mg total) by mouth daily. 03/07/20   [name], MD  Darbepoetin Alfa (ARANESP) 60 MCG/0.3ML SOSY injection Inject 0.3 mLs  (60 mcg total) into the vein every Saturday with hemodialysis. 01/19/21   Bonnielee Haff, MD  doxepin (SINEQUAN) 75 MG capsule Take 75 mg by mouth at bedtime.    [provider]  EMOLLIENT EX Apply 1 application topically See admin instructions. Apply emollient lotion topically to arms and legs  one time daily for dry skin    [provider]  insulin glargine (LANTUS) 100 unit/mL SOPN Inject 5 Units into the skin at bedtime.    [provider]  Insulin Pen Needle (SURE COMFORT PEN NEEDLES) 31G X 8 MM MISC Use as directed twice daily with Novolog flex pen Patient taking differently: 1 each by Other route See admin instructions. Use as directed twice daily with Novolog flex  pen 02/10/19   [name], MD  levETIRAcetam (KEPPRA) 500 MG tablet Take 500 mg by mouth in the morning and at bedtime.    [provider]  Misc. Devices (COMMODE 3-IN-1) MISC Use as directed 02/24/20   [name], MD  Misc. Devices (TRANSFER BENCH) MISC Use as directed 02/24/20   [name], MD  Multiple Vitamin (MULTIVITAMIN WITH MINERALS) TABS tablet Take 1 tablet by mouth at bedtime.    [provider]  Nutritional Supplements (FEEDING SUPPLEMENT, NEPRO CARB STEADY,) LIQD Take 237 mLs by mouth 3 (three) times daily before meals.    [provider]  ondansetron (ZOFRAN) 4 MG tablet Take 4 mg by mouth every 4 (four) hours as needed for nausea/vomiting. 12/19/20   [provider]  pantoprazole (PROTONIX) 40 MG tablet TAKE 1 TABLET BY MOUTH EVERY DAY Patient taking differently: Take 40 mg by mouth daily. 01/23/20   [name], MD  pravastatin (PRAVACHOL) 80 MG tablet Take 1 tablet (80 mg total) by mouth at bedtime. 03/07/20   [name], MD  sulfamethoxazole-trimethoprim (BACTRIM DS) 800-160 MG tablet Take 1 tablet by mouth 3 (three) times a week. Patient taking differently: Take 1 tablet by mouth every Monday, Wednesday, and Friday.  04/23/20   Little Ishikawa, MD  tamsulosin (FLOMAX) 0.4 MG CAPS capsule TAKE 1 CAPSULE BY MOUTH EVERY DAY Patient taking differently: Take 0.4 mg by mouth daily. 03/28/20   [name], MD  triamcinolone cream (KENALOG) 0.1 % Apply 1 application topically 2 (two) times daily.    [provider]  TRUEPLUS INSULIN SYRINGE 31G X 5/16" 1 ML MISC Use pen needle with insulin 2 times daily Patient taking differently: 1 each by Other route in the morning and at bedtime. With insulin 03/31/19   [name], MD    Allergies    Oxycodone  Review of Systems   Review of Systems  Constitutional:  Negative for fever.  Respiratory:  Positive for shortness of breath. Negative for cough.   Cardiovascular:  Negative for chest pain.  Gastrointestinal:  Negative for abdominal pain, nausea and vomiting.  Genitourinary:  Negative for dysuria.  All other systems reviewed and are negative.  Physical Exam Updated Vital Signs BP (!) 216/120 (BP Location: Right Arm)   Pulse (!) 117   Temp (!) 97.5 F (36.4 C) (Temporal)   Resp (!) 31   SpO2 99%   Physical Exam Vitals and nursing note reviewed.  Constitutional:      Appearance: He is well-developed.     Comments: Chronically ill-appearing  HENT:     Head: Normocephalic and atraumatic.     Nose: Nose normal.     Mouth/Throat:     Mouth: Mucous membranes are moist.  Eyes:     Pupils: Pupils are equal, round, and reactive to light.  Cardiovascular:     Rate and Rhythm: Regular rhythm. Tachycardia present.     Heart sounds: Normal heart sounds. No murmur heard. Pulmonary:     Effort: Pulmonary effort is normal. No respiratory distress.     Breath sounds: Normal breath sounds. No wheezing.     Comments: Diminished breath sounds right lobe, mild tachypnea, nasal cannula in place Abdominal:     General: Bowel sounds are normal.     Palpations: Abdomen is soft.     Tenderness: There is no abdominal tenderness. There is no  rebound.  Musculoskeletal:        General: No  swelling.     Cervical back: Neck supple.     Comments: Contracture of right upper extremity Fistula left upper extremity with positive thrill  Lymphadenopathy:     Cervical: No cervical adenopathy.  Skin:    General: Skin is warm and dry.  Neurological:     Mental Status: He is alert and oriented to person, place, and time.  Psychiatric:     Comments: Agitated    ED Results / Procedures / Treatments   Labs (all labs ordered are listed, but only abnormal results are displayed) Labs Reviewed  I-STAT CHEM 8, ED - Abnormal; Notable for the following components:      Result Value   Potassium 6.1 (*)    Chloride 113 (*)    BUN 100 (*)    Creatinine, Ser 15.60 (*)    Glucose, Bld 118 (*)    Calcium, Ion 0.90 (*)    TCO2 18 (*)    All other components within normal limits  RESP PANEL BY RT-PCR (FLU A&B, COVID) ARPGX2  CBC WITH DIFFERENTIAL/PLATELET  BASIC METABOLIC PANEL    EKG EKG Interpretation  Date/Time:  Tuesday April 02 2021 06:31:53 EDT Ventricular Rate:  115 PR Interval:  152 QRS Duration: 87 QT Interval:  344 QTC Calculation: 476 R Axis:   44 Text Interpretation: Sinus tachycardia Ventricular trigeminy LAE, consider biatrial enlargement Probable anteroseptal infarct, recent Confirmed by Thayer Jew [phone]) on 04/02/2021 7:02:53 AM  Radiology No results found.  Procedures .Critical Care  Date/Time: 04/02/2021 7:14 AM Performed by: Merryl Hacker, MD Authorized by: Merryl Hacker, MD   Critical care provider statement:    Critical care time (minutes):  45   Critical care was necessary to treat or prevent imminent or life-threatening deterioration of the following conditions:  Respiratory failure   Critical care was time spent personally by me on the following activities:  Discussions with consultants, evaluation of patient's response to treatment, examination of patient, ordering and performing  treatments and interventions, ordering and review of laboratory studies, ordering and review of radiographic studies, pulse oximetry, re-evaluation of patient's condition, obtaining history from patient or surrogate and review of old charts   Medications Ordered in ED Medications - No data to display  ED Course  I have reviewed the triage vital signs and the nursing notes.  Pertinent labs & imaging results that were available during my care of the patient were reviewed by me and considered in my medical decision making (see chart for details).    MDM Rules/Calculators/A&P                          Patient presents with shortness of breath.  He is chronically ill-appearing but nontoxic on exam.  He is on nasal cannula.  He is tachycardic and hypertensive.  He is not febrile.  Work-up initiated including basic labs, EKG, chest x-ray.  Highly suspect hypertensive emergency versus pulmonary edema.  Patient initially refusing IV access.  Have requested that nursing contact IV team as patient is a difficult stick.  Initial Chem-8 with potassium of 6.1.  No EKG changes.  Without access, patient was started on nitroglycerin paste.  Chest x-ray pending. Final Clinical Impression(s) / ED Diagnoses Final diagnoses:  None    Rx / DC Orders ED Discharge Orders     None        Carlotta Telfair, Barbette Hair, MD 04/02/21 [phone]

## 2021-04-02 NOTE — ED Provider Notes (Signed)
Patient returns from dialysis.  He is currently asymptomatic.  He says he feels much better than when he came in.  He finished all bit about the last 45 minutes of his dialysis.  I spoke with Dr. Osborne Casco with nephrology who says that it is fine for him to go home from a dialysis standpoint.  Patient was discharged home in good condition.  Return precautions were given.   Richard Johns, MD 04/02/21 [phone]

## 2021-04-02 NOTE — ED Triage Notes (Signed)
Pt arrives via EMS with c/o of shortness of breath this am. From [place]. Facility reports 90% RA. History of CHF. HD pt, T,TH,Sat. Missed HD Sat.   HR 126 Unable to obtain BP d/t pt refusal

## 2021-04-02 NOTE — Progress Notes (Addendum)
System clotted, lines restrung and reprimed, pt restarted. Additional flushes of 500 mL factored back into goal.  5 minutes after restarting tx to finish 48 minutes, pt states he doesn't want to finish.  Pt first stated he was hungry. He was given a snack. Then he stated his butt was sore, so he was repositioned. After education, still saying he wants to be done.  Messaged provider, achknowledged this is normal behavior for pt. Pt has less than 27 minutes left, but signed AMA. Stable, report given to [employer], RN in Emergency dept.  2266 mL removed. Pressures dropped one time, goal adjusted, pressures dropped again after restarted, UF paused. After rinseback pt normotensive

## 2021-04-02 NOTE — ED Notes (Signed)
IV at bedside attempting to obtain IV. Pt refusing at this time. MD made aware and pt aware IV meds needed

## 2021-04-02 NOTE — ED Notes (Signed)
Patient refused blood draw report to nurse [facility] RN

## 2021-04-02 NOTE — ED Notes (Signed)
Pt back from dialysis. No report received. Pt A&O. Rn paged Nephro d/t no admission orders being placed. Pt holding in ED

## 2021-04-02 NOTE — ED Notes (Signed)
Pt very argumentative. Refusing treatment, blood drawn and IV start. This RN explained the process of care. Pt verbalized understanding but states he doesn't want it done. RN continued encouraging to allow staff to try. Pt stated we can try but he "Dont got no veins"

## 2021-04-02 NOTE — ED Provider Notes (Signed)
Signed out that dialysis pt needs hd today, labs pending.   K is high. Bp is high - hx same. No headache. No change in vision or speech. No numbness/weakness. No chest pain. Given potential delay in achieving HD this AM, will give Ca gluc dose iv.  Renal consulted for HD. Discussed pt with Dr Jonnie Finner, he will arrange for HD in hospital. Appears likely patient will be able to be d/c post HD.  Recheck, pt comfortable, nad. No new c/o. Awaiting HD.  BP 195/98. Pulse ox 94%.      Lajean Saver, MD 04/02/21 [phone]

## 2021-04-02 NOTE — ED Notes (Signed)
Pt taken to HD.  

## 2021-04-02 NOTE — Procedures (Signed)
Called to see patient for dialysis.  Pt presented for SOB, O2 sats were borderline arrival. K+ in ED was > 6.  HR up.  CXR was negative. Asked to see for dialysis by ED, pt does not require admission at this time per ED staff.  Pt seen on dialysis. In no distress. Exam shows min LE edema, clear lungs, alert and Ox3.   After HD pt will be sent back to ED for reassessment.  [facility]   4h  400/800   67.5kg   2/2 bath  LUA AVF / TDC  Hep 1200  - missed last 2 HD sessions  - hect 2 ug    Kelly Splinter, MD 04/02/2021, 12:41 PM

## 2021-04-02 NOTE — Discharge Instructions (Addendum)
Make sure that you get your dialysis as scheduled.  Follow-up with your primary care physician.  Return here as needed for any worsening symptoms.

## 2021-04-02 NOTE — ED Notes (Signed)
IV team ordered due to multiple attempts for IV and pt needing IV medication

## 2021-04-03 DIAGNOSIS — E785 Hyperlipidemia, unspecified: Secondary | ICD-10-CM | POA: Diagnosis not present

## 2021-04-03 DIAGNOSIS — Z7401 Bed confinement status: Secondary | ICD-10-CM | POA: Diagnosis not present

## 2021-04-03 DIAGNOSIS — K219 Gastro-esophageal reflux disease without esophagitis: Secondary | ICD-10-CM | POA: Diagnosis not present

## 2021-04-03 DIAGNOSIS — B20 Human immunodeficiency virus [HIV] disease: Secondary | ICD-10-CM | POA: Diagnosis not present

## 2021-04-03 DIAGNOSIS — N529 Male erectile dysfunction, unspecified: Secondary | ICD-10-CM | POA: Diagnosis not present

## 2021-04-03 DIAGNOSIS — N186 End stage renal disease: Secondary | ICD-10-CM | POA: Diagnosis not present

## 2021-04-03 DIAGNOSIS — E1129 Type 2 diabetes mellitus with other diabetic kidney complication: Secondary | ICD-10-CM | POA: Diagnosis not present

## 2021-04-03 DIAGNOSIS — I120 Hypertensive chronic kidney disease with stage 5 chronic kidney disease or end stage renal disease: Secondary | ICD-10-CM | POA: Diagnosis not present

## 2021-04-03 DIAGNOSIS — I10 Essential (primary) hypertension: Secondary | ICD-10-CM | POA: Diagnosis not present

## 2021-04-03 DIAGNOSIS — E119 Type 2 diabetes mellitus without complications: Secondary | ICD-10-CM | POA: Diagnosis not present

## 2021-04-03 DIAGNOSIS — R569 Unspecified convulsions: Secondary | ICD-10-CM | POA: Diagnosis not present

## 2021-04-03 LAB — HEPATITIS B SURFACE ANTIBODY, QUANTITATIVE: Hep B S AB Quant (Post): <3.1 m[IU]/mL — ABNORMAL LOW (ref >9.9)

## 2021-04-04 DIAGNOSIS — N186 End stage renal disease: Secondary | ICD-10-CM | POA: Diagnosis not present

## 2021-04-04 DIAGNOSIS — Z992 Dependence on renal dialysis: Secondary | ICD-10-CM | POA: Diagnosis not present

## 2021-04-04 DIAGNOSIS — N2581 Secondary hyperparathyroidism of renal origin: Secondary | ICD-10-CM | POA: Diagnosis not present

## 2021-04-09 DIAGNOSIS — E119 Type 2 diabetes mellitus without complications: Secondary | ICD-10-CM | POA: Diagnosis not present

## 2021-04-09 DIAGNOSIS — E785 Hyperlipidemia, unspecified: Secondary | ICD-10-CM | POA: Diagnosis not present

## 2021-04-09 DIAGNOSIS — N186 End stage renal disease: Secondary | ICD-10-CM | POA: Diagnosis not present

## 2021-04-09 DIAGNOSIS — Z992 Dependence on renal dialysis: Secondary | ICD-10-CM | POA: Diagnosis not present

## 2021-04-09 DIAGNOSIS — I10 Essential (primary) hypertension: Secondary | ICD-10-CM | POA: Diagnosis not present

## 2021-04-09 DIAGNOSIS — E559 Vitamin D deficiency, unspecified: Secondary | ICD-10-CM | POA: Diagnosis not present

## 2021-04-09 DIAGNOSIS — N2581 Secondary hyperparathyroidism of renal origin: Secondary | ICD-10-CM | POA: Diagnosis not present

## 2021-04-09 DIAGNOSIS — N400 Enlarged prostate without lower urinary tract symptoms: Secondary | ICD-10-CM | POA: Diagnosis not present

## 2021-04-09 DIAGNOSIS — I509 Heart failure, unspecified: Secondary | ICD-10-CM | POA: Diagnosis not present

## 2021-04-12 DIAGNOSIS — L89301 Pressure ulcer of unspecified buttock, stage 1: Secondary | ICD-10-CM | POA: Diagnosis not present

## 2021-04-14 ENCOUNTER — Emergency Department (HOSPITAL_COMMUNITY): Payer: No Typology Code available for payment source

## 2021-04-14 ENCOUNTER — Inpatient Hospital Stay (HOSPITAL_COMMUNITY): Payer: No Typology Code available for payment source

## 2021-04-14 ENCOUNTER — Inpatient Hospital Stay (HOSPITAL_COMMUNITY)
Admission: EM | Admit: 2021-04-14 | Discharge: 2021-04-15 | DRG: 291 | Disposition: A | Discharge status: Skilled Nursing Facility | Payer: No Typology Code available for payment source | Source: Skilled Nursing Facility | Attending: Family Medicine | Admitting: Family Medicine

## 2021-04-14 ENCOUNTER — Encounter (HOSPITAL_COMMUNITY): Payer: Self-pay

## 2021-04-14 DIAGNOSIS — I5033 Acute on chronic diastolic (congestive) heart failure: Secondary | ICD-10-CM | POA: Diagnosis not present

## 2021-04-14 DIAGNOSIS — I69351 Hemiplegia and hemiparesis following cerebral infarction affecting right dominant side: Secondary | ICD-10-CM

## 2021-04-14 DIAGNOSIS — Z794 Long term (current) use of insulin: Secondary | ICD-10-CM

## 2021-04-14 DIAGNOSIS — E872 Acidosis: Secondary | ICD-10-CM | POA: Diagnosis present

## 2021-04-14 DIAGNOSIS — R0602 Shortness of breath: Secondary | ICD-10-CM | POA: Diagnosis not present

## 2021-04-14 DIAGNOSIS — R0600 Dyspnea, unspecified: Secondary | ICD-10-CM | POA: Diagnosis not present

## 2021-04-14 DIAGNOSIS — Z515 Encounter for palliative care: Secondary | ICD-10-CM

## 2021-04-14 DIAGNOSIS — R1010 Upper abdominal pain, unspecified: Secondary | ICD-10-CM | POA: Diagnosis not present

## 2021-04-14 DIAGNOSIS — Z992 Dependence on renal dialysis: Secondary | ICD-10-CM

## 2021-04-14 DIAGNOSIS — Z7902 Long term (current) use of antithrombotics/antiplatelets: Secondary | ICD-10-CM

## 2021-04-14 DIAGNOSIS — F17210 Nicotine dependence, cigarettes, uncomplicated: Secondary | ICD-10-CM | POA: Diagnosis present

## 2021-04-14 DIAGNOSIS — Z823 Family history of stroke: Secondary | ICD-10-CM

## 2021-04-14 DIAGNOSIS — J9621 Acute and chronic respiratory failure with hypoxia: Secondary | ICD-10-CM | POA: Diagnosis not present

## 2021-04-14 DIAGNOSIS — I10 Essential (primary) hypertension: Secondary | ICD-10-CM | POA: Diagnosis not present

## 2021-04-14 DIAGNOSIS — E1122 Type 2 diabetes mellitus with diabetic chronic kidney disease: Secondary | ICD-10-CM | POA: Diagnosis not present

## 2021-04-14 DIAGNOSIS — Z20822 Contact with and (suspected) exposure to covid-19: Secondary | ICD-10-CM | POA: Diagnosis present

## 2021-04-14 DIAGNOSIS — R778 Other specified abnormalities of plasma proteins: Secondary | ICD-10-CM | POA: Diagnosis not present

## 2021-04-14 DIAGNOSIS — B20 Human immunodeficiency virus [HIV] disease: Secondary | ICD-10-CM | POA: Diagnosis present

## 2021-04-14 DIAGNOSIS — I132 Hypertensive heart and chronic kidney disease with heart failure and with stage 5 chronic kidney disease, or end stage renal disease: Principal | ICD-10-CM | POA: Diagnosis present

## 2021-04-14 DIAGNOSIS — J9600 Acute respiratory failure, unspecified whether with hypoxia or hypercapnia: Secondary | ICD-10-CM | POA: Diagnosis present

## 2021-04-14 DIAGNOSIS — Z8616 Personal history of COVID-19: Secondary | ICD-10-CM | POA: Diagnosis not present

## 2021-04-14 DIAGNOSIS — E8771 Transfusion associated circulatory overload: Secondary | ICD-10-CM | POA: Diagnosis not present

## 2021-04-14 DIAGNOSIS — Z79899 Other long term (current) drug therapy: Secondary | ICD-10-CM

## 2021-04-14 DIAGNOSIS — Z885 Allergy status to narcotic agent status: Secondary | ICD-10-CM

## 2021-04-14 DIAGNOSIS — M7918 Myalgia, other site: Secondary | ICD-10-CM

## 2021-04-14 DIAGNOSIS — E785 Hyperlipidemia, unspecified: Secondary | ICD-10-CM | POA: Diagnosis present

## 2021-04-14 DIAGNOSIS — D649 Anemia, unspecified: Secondary | ICD-10-CM | POA: Diagnosis not present

## 2021-04-14 DIAGNOSIS — I160 Hypertensive urgency: Secondary | ICD-10-CM | POA: Diagnosis present

## 2021-04-14 DIAGNOSIS — Z8249 Family history of ischemic heart disease and other diseases of the circulatory system: Secondary | ICD-10-CM | POA: Diagnosis not present

## 2021-04-14 DIAGNOSIS — E1140 Type 2 diabetes mellitus with diabetic neuropathy, unspecified: Secondary | ICD-10-CM | POA: Diagnosis present

## 2021-04-14 DIAGNOSIS — G40909 Epilepsy, unspecified, not intractable, without status epilepticus: Secondary | ICD-10-CM | POA: Diagnosis present

## 2021-04-14 DIAGNOSIS — N189 Chronic kidney disease, unspecified: Secondary | ICD-10-CM

## 2021-04-14 DIAGNOSIS — M545 Low back pain, unspecified: Secondary | ICD-10-CM | POA: Diagnosis not present

## 2021-04-14 DIAGNOSIS — D631 Anemia in chronic kidney disease: Secondary | ICD-10-CM | POA: Diagnosis present

## 2021-04-14 DIAGNOSIS — Z820 Family history of epilepsy and other diseases of the nervous system: Secondary | ICD-10-CM

## 2021-04-14 DIAGNOSIS — R531 Weakness: Secondary | ICD-10-CM | POA: Diagnosis not present

## 2021-04-14 DIAGNOSIS — J9811 Atelectasis: Secondary | ICD-10-CM | POA: Diagnosis not present

## 2021-04-14 DIAGNOSIS — N186 End stage renal disease: Secondary | ICD-10-CM | POA: Diagnosis present

## 2021-04-14 DIAGNOSIS — R1084 Generalized abdominal pain: Secondary | ICD-10-CM | POA: Diagnosis not present

## 2021-04-14 DIAGNOSIS — J810 Acute pulmonary edema: Secondary | ICD-10-CM | POA: Diagnosis not present

## 2021-04-14 DIAGNOSIS — R0902 Hypoxemia: Secondary | ICD-10-CM | POA: Diagnosis not present

## 2021-04-14 DIAGNOSIS — R569 Unspecified convulsions: Secondary | ICD-10-CM

## 2021-04-14 DIAGNOSIS — N250 Renal osteodystrophy: Secondary | ICD-10-CM | POA: Diagnosis not present

## 2021-04-14 DIAGNOSIS — K219 Gastro-esophageal reflux disease without esophagitis: Secondary | ICD-10-CM | POA: Diagnosis present

## 2021-04-14 DIAGNOSIS — J80 Acute respiratory distress syndrome: Secondary | ICD-10-CM | POA: Diagnosis not present

## 2021-04-14 DIAGNOSIS — J9601 Acute respiratory failure with hypoxia: Secondary | ICD-10-CM | POA: Diagnosis present

## 2021-04-14 DIAGNOSIS — E119 Type 2 diabetes mellitus without complications: Secondary | ICD-10-CM

## 2021-04-14 DIAGNOSIS — I120 Hypertensive chronic kidney disease with stage 5 chronic kidney disease or end stage renal disease: Secondary | ICD-10-CM | POA: Diagnosis not present

## 2021-04-14 DIAGNOSIS — E1129 Type 2 diabetes mellitus with other diabetic kidney complication: Secondary | ICD-10-CM | POA: Diagnosis not present

## 2021-04-14 DIAGNOSIS — E1169 Type 2 diabetes mellitus with other specified complication: Secondary | ICD-10-CM

## 2021-04-14 DIAGNOSIS — Z7401 Bed confinement status: Secondary | ICD-10-CM | POA: Diagnosis not present

## 2021-04-14 DIAGNOSIS — E8770 Fluid overload, unspecified: Secondary | ICD-10-CM | POA: Diagnosis present

## 2021-04-14 HISTORY — DX: Chronic kidney disease, unspecified: N18.9

## 2021-04-14 LAB — CBC WITH DIFFERENTIAL/PLATELET
Abs Immature Granulocytes: 0.03 10*3/uL (ref 0.00–0.07)
Basophils Absolute: 0 10*3/uL (ref 0.0–0.1)
Basophils Relative: 0 %
Eosinophils Absolute: 0.1 10*3/uL (ref 0.0–0.5)
Eosinophils Relative: 1 %
HCT: 34.5 % — ABNORMAL LOW (ref 39.0–52.0)
Hemoglobin: 10.9 g/dL — ABNORMAL LOW (ref 13.0–17.0)
Immature Granulocytes: 0 %
Lymphocytes Relative: 13 %
Lymphs Abs: 1.2 10*3/uL (ref 0.7–4.0)
MCH: 31.2 pg (ref 26.0–34.0)
MCHC: 31.6 g/dL (ref 30.0–36.0)
MCV: 98.9 fL (ref 80.0–100.0)
Monocytes Absolute: 0.6 10*3/uL (ref 0.1–1.0)
Monocytes Relative: 6 %
Neutro Abs: 7.7 10*3/uL (ref 1.7–7.7)
Neutrophils Relative %: 80 %
Platelets: 228 10*3/uL (ref 150–400)
RBC: 3.49 MIL/uL — ABNORMAL LOW (ref 4.22–5.81)
RDW: 14.6 % (ref 11.5–15.5)
WBC: 9.7 10*3/uL (ref 4.0–10.5)
nRBC: 0 % (ref 0.0–0.2)

## 2021-04-14 LAB — I-STAT CHEM 8, ED
BUN: 79 mg/dL — ABNORMAL HIGH (ref 8–23)
Calcium, Ion: 1.03 mmol/L — ABNORMAL LOW (ref 1.15–1.40)
Chloride: 109 mmol/L (ref 98–111)
Creatinine, Ser: 14.4 mg/dL — ABNORMAL HIGH (ref 0.61–1.24)
Glucose, Bld: 122 mg/dL — ABNORMAL HIGH (ref 70–99)
HCT: 35 % — ABNORMAL LOW (ref 39.0–52.0)
Hemoglobin: 11.9 g/dL — ABNORMAL LOW (ref 13.0–17.0)
Potassium: 5.2 mmol/L — ABNORMAL HIGH (ref 3.5–5.1)
Sodium: 140 mmol/L (ref 135–145)
TCO2: 20 mmol/L — ABNORMAL LOW (ref 22–32)

## 2021-04-14 LAB — RESP PANEL BY RT-PCR (FLU A&B, COVID) ARPGX2
Influenza A by PCR: NEGATIVE
Influenza B by PCR: NEGATIVE
SARS Coronavirus 2 by RT PCR: NEGATIVE

## 2021-04-14 LAB — TROPONIN I (HIGH SENSITIVITY): Troponin I (High Sensitivity): 78 ng/L — ABNORMAL HIGH (ref <18)

## 2021-04-14 LAB — COMPREHENSIVE METABOLIC PANEL
ALT: 14 U/L (ref 0–44)
AST: 18 U/L (ref 15–41)
Albumin: 3.4 g/dL — ABNORMAL LOW (ref 3.5–5.0)
Alkaline Phosphatase: 93 U/L (ref 38–126)
Anion gap: 16 — ABNORMAL HIGH (ref 5–15)
BUN: 76 mg/dL — ABNORMAL HIGH (ref 8–23)
CO2: 19 mmol/L — ABNORMAL LOW (ref 22–32)
Calcium: 8.6 mg/dL — ABNORMAL LOW (ref 8.9–10.3)
Chloride: 103 mmol/L (ref 98–111)
Creatinine, Ser: 14.03 mg/dL — ABNORMAL HIGH (ref 0.61–1.24)
GFR, Estimated: 4 mL/min — ABNORMAL LOW (ref >60)
Glucose, Bld: 127 mg/dL — ABNORMAL HIGH (ref 70–99)
Potassium: 5.1 mmol/L (ref 3.5–5.1)
Sodium: 138 mmol/L (ref 135–145)
Total Bilirubin: 0.6 mg/dL (ref 0.3–1.2)
Total Protein: 7.3 g/dL (ref 6.5–8.1)

## 2021-04-14 LAB — GLUCOSE, CAPILLARY
Glucose-Capillary: 83 mg/dL (ref 70–99)
Glucose-Capillary: 92 mg/dL (ref 70–99)

## 2021-04-14 LAB — BRAIN NATRIURETIC PEPTIDE: B Natriuretic Peptide: 2190.5 pg/mL — ABNORMAL HIGH (ref 0.0–100.0)

## 2021-04-14 MED ORDER — HEPARIN SODIUM (PORCINE) 5000 UNIT/ML IJ SOLN
5000.0000 [IU] | Freq: Three times a day (TID) | INTRAMUSCULAR | Status: DC
  Administered 2021-04-14 – 2021-04-15 (×4): 5000 [IU] via SUBCUTANEOUS
  Filled 2021-04-14 (×3): qty 1

## 2021-04-14 MED ORDER — SODIUM CHLORIDE 0.9 % IV SOLN: 100.0000 mL | INTRAVENOUS | Status: DC | PRN

## 2021-04-14 MED ORDER — CARVEDILOL 12.5 MG PO TABS
12.5000 mg | ORAL_TABLET | Freq: Two times a day (BID) | ORAL | Status: DC
  Administered 2021-04-14 – 2021-04-15 (×3): 12.5 mg via ORAL
  Filled 2021-04-14 (×3): qty 1

## 2021-04-14 MED ORDER — INSULIN GLARGINE 100 UNIT/ML ~~LOC~~ SOLN
2.0000 [IU] | Freq: Every day | SUBCUTANEOUS | Status: DC
  Filled 2021-04-14 (×2): qty 0.02

## 2021-04-14 MED ORDER — CLONIDINE HCL 0.1 MG PO TABS
0.1000 mg | ORAL_TABLET | Freq: Three times a day (TID) | ORAL | Status: DC
  Administered 2021-04-14 – 2021-04-15 (×5): 0.1 mg via ORAL
  Filled 2021-04-14 (×5): qty 1

## 2021-04-14 MED ORDER — ACETAMINOPHEN 325 MG PO TABS: 650.0000 mg | ORAL_TABLET | Freq: Four times a day (QID) | ORAL | Status: DC | PRN

## 2021-04-14 MED ORDER — HYDRALAZINE HCL 20 MG/ML IJ SOLN
10.0000 mg | INTRAMUSCULAR | Status: DC | PRN
Start: 2021-04-14 — End: 2021-04-14
  Administered 2021-04-14: 20 mg via INTRAVENOUS
  Filled 2021-04-14: qty 1

## 2021-04-14 MED ORDER — TAMSULOSIN HCL 0.4 MG PO CAPS
0.4000 mg | ORAL_CAPSULE | Freq: Every day | ORAL | Status: DC
  Administered 2021-04-14 – 2021-04-15 (×2): 0.4 mg via ORAL
  Filled 2021-04-14 (×2): qty 1

## 2021-04-14 MED ORDER — LIDOCAINE 5 % EX PTCH
1.0000 | MEDICATED_PATCH | CUTANEOUS | Status: DC
  Administered 2021-04-14 – 2021-04-15 (×2): 1 via TRANSDERMAL
  Filled 2021-04-14 (×2): qty 1

## 2021-04-14 MED ORDER — ALBUTEROL SULFATE (2.5 MG/3ML) 0.083% IN NEBU: 2.5000 mg | INHALATION_SOLUTION | RESPIRATORY_TRACT | Status: DC | PRN

## 2021-04-14 MED ORDER — INSULIN GLARGINE 100 UNITS/ML SOLOSTAR PEN: 5.0000 [IU] | PEN_INJECTOR | Freq: Every day | SUBCUTANEOUS | Status: DC

## 2021-04-14 MED ORDER — SODIUM ZIRCONIUM CYCLOSILICATE 10 G PO PACK
10.0000 g | PACK | ORAL | Status: AC
  Administered 2021-04-14: 10 g via ORAL
  Filled 2021-04-14: qty 1

## 2021-04-14 MED ORDER — LIDOCAINE HCL (PF) 1 % IJ SOLN: 5.0000 mL | INTRAMUSCULAR | Status: DC | PRN

## 2021-04-14 MED ORDER — PRAVASTATIN SODIUM 40 MG PO TABS
80.0000 mg | ORAL_TABLET | Freq: Every day | ORAL | Status: DC
  Administered 2021-04-15: 80 mg via ORAL
  Filled 2021-04-14: qty 2

## 2021-04-14 MED ORDER — ACETAMINOPHEN 650 MG RE SUPP: 650.0000 mg | Freq: Four times a day (QID) | RECTAL | Status: DC | PRN

## 2021-04-14 MED ORDER — CLOPIDOGREL BISULFATE 75 MG PO TABS
75.0000 mg | ORAL_TABLET | Freq: Every day | ORAL | Status: DC
  Administered 2021-04-15: 75 mg via ORAL
  Filled 2021-04-14 (×2): qty 1

## 2021-04-14 MED ORDER — HYDRALAZINE HCL 25 MG PO TABS
25.0000 mg | ORAL_TABLET | Freq: Once | ORAL | Status: AC
  Administered 2021-04-14: 25 mg via ORAL
  Filled 2021-04-14: qty 1

## 2021-04-14 MED ORDER — TRIAMCINOLONE ACETONIDE 0.1 % EX CREA: 1.0000 "application " | TOPICAL_CREAM | Freq: Two times a day (BID) | CUTANEOUS | Status: DC | PRN

## 2021-04-14 MED ORDER — CHLORHEXIDINE GLUCONATE CLOTH 2 % EX PADS
6.0000 | MEDICATED_PAD | Freq: Every day | CUTANEOUS | Status: DC
  Administered 2021-04-14: 6 via TOPICAL

## 2021-04-14 MED ORDER — ALTEPLASE 2 MG IJ SOLR
2.0000 mg | Freq: Once | INTRAMUSCULAR | Status: DC | PRN
Start: 2021-04-14 — End: 2021-04-15

## 2021-04-14 MED ORDER — SODIUM CHLORIDE 0.9% FLUSH
3.0000 mL | Freq: Two times a day (BID) | INTRAVENOUS | Status: DC
  Administered 2021-04-14 – 2021-04-15 (×3): 3 mL via INTRAVENOUS

## 2021-04-14 MED ORDER — NITROGLYCERIN 2 % TD OINT
1.0000 [in_us] | TOPICAL_OINTMENT | Freq: Once | TRANSDERMAL | Status: AC
  Administered 2021-04-14: 1 [in_us] via TOPICAL
  Filled 2021-04-14: qty 1

## 2021-04-14 MED ORDER — LEVETIRACETAM 500 MG PO TABS
500.0000 mg | ORAL_TABLET | Freq: Two times a day (BID) | ORAL | Status: DC
  Administered 2021-04-14 – 2021-04-15 (×3): 500 mg via ORAL
  Filled 2021-04-14 (×3): qty 1

## 2021-04-14 MED ORDER — INSULIN ASPART 100 UNIT/ML IJ SOLN: 0.0000 [IU] | Freq: Three times a day (TID) | INTRAMUSCULAR | Status: DC

## 2021-04-14 MED ORDER — CARVEDILOL 12.5 MG PO TABS
12.5000 mg | ORAL_TABLET | ORAL | Status: AC
  Administered 2021-04-14: 12.5 mg via ORAL
  Filled 2021-04-14: qty 1

## 2021-04-14 MED ORDER — LIDOCAINE-PRILOCAINE 2.5-2.5 % EX CREA: 1.0000 "application " | TOPICAL_CREAM | CUTANEOUS | Status: DC | PRN

## 2021-04-14 MED ORDER — HYDRALAZINE HCL 20 MG/ML IJ SOLN
10.0000 mg | INTRAMUSCULAR | Status: DC | PRN
  Filled 2021-04-14 (×2): qty 1

## 2021-04-14 MED ORDER — BICTEGRAVIR-EMTRICITAB-TENOFOV 50-200-25 MG PO TABS
1.0000 | ORAL_TABLET | Freq: Every day | ORAL | Status: DC
  Administered 2021-04-14 – 2021-04-15 (×2): 1 via ORAL
  Filled 2021-04-14 (×2): qty 1

## 2021-04-14 MED ORDER — HEPARIN SODIUM (PORCINE) 1000 UNIT/ML DIALYSIS: 20.0000 [IU]/kg | INTRAMUSCULAR | Status: DC | PRN

## 2021-04-14 MED ORDER — HEPARIN SODIUM (PORCINE) 1000 UNIT/ML DIALYSIS: 1000.0000 [IU] | INTRAMUSCULAR | Status: DC | PRN

## 2021-04-14 MED ORDER — PENTAFLUOROPROP-TETRAFLUOROETH EX AERO: 1.0000 "application " | INHALATION_SPRAY | CUTANEOUS | Status: DC | PRN

## 2021-04-14 NOTE — ED Notes (Signed)
Updated SNF on plan of care for pt, pt refusing IV from IV team, refusing blood draw, pt is finally agreeable to give phlebotomist on chance at a blood draw

## 2021-04-14 NOTE — ED Triage Notes (Signed)
Pt comes from [place] via [facility], sudden onset of SOB, hx of CHF, lung sounds diminished throughout, pt was 89 on 3L upon EMS arrival, pt is a dialysis pt and goes T,Thu, Sat

## 2021-04-14 NOTE — ED Provider Notes (Addendum)
[facility] EMERGENCY DEPARTMENT Provider Note   CSN: [id number] Arrival date & time: 04/14/21  0249     History Chief Complaint  Patient presents with   Respiratory Distress    Richard [name] is a 65 y.o. male.  The history is provided by the patient.  Shortness of Breath Severity:  Moderate Onset quality:  Gradual Duration:  1 day Timing:  Constant Progression:  Unchanged Chronicity:  Recurrent Context: not URI   Context comment:  Did not go to dialysis Relieved by:  Nothing Worsened by:  Nothing Ineffective treatments:  None tried Associated symptoms: no abdominal pain, no chest pain, no diaphoresis, no ear pain, no fever, no rash and no vomiting   Risk factors: no recent alcohol use   Patient with ESRD did not go to dialysis on Saturday (T TH S patient) because they did not have his numbing cream, so he left.  Now reports SOB.      Past Medical History:  Diagnosis Date   Arthritis    Diabetes mellitus, type II, insulin dependent (HCC)    GERD (gastroesophageal reflux disease)    Hypertension    Hypertensive emergency 05/21/2012   Immune deficiency disorder ([place])    Migraines    Neuropathy in diabetes ([place])    bilat feet   Ruptured lumbar disc    Stroke ([place]) 2013   residual right sided weakness (arm>leg)    Patient Active Problem List   Diagnosis Date Noted   Elevated troponin 02/18/2021   Acute on chronic diastolic CHF (congestive heart failure) ([place]) 02/18/2021   Depression 02/18/2021   Hemorrhoids 01/11/2021   Hyperkalemia 01/10/2021   Dialysis patient, noncompliant ([place])    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   Syncope 06/01/2020   Secondary hyperparathyroidism of renal origin ([place]) 05/28/2020   COVID-19 05/16/2020   Hypokalemia 05/16/2020   Allergy, unspecified, initial encounter 05/08/2020   Anaphylactic shock, unspecified, initial encounter 05/08/2020   Anemia in chronic kidney disease  17/49/4496   Complication of vascular dialysis catheter 05/08/2020   Dependence on renal dialysis ([place]) 05/08/2020   Headache, unspecified 05/08/2020   Iron deficiency anemia, unspecified 05/08/2020   Cerebellar stroke syndrome 05/07/2020   Encounter for feeding tube placement    Weakness    Creatinine elevation    Goals of care, counseling/discussion    Hypertensive urgency    Meningoencephalitis    Palliative care by specialist    ESRD (end stage renal disease) ([place])    AIDS (acquired immune deficiency syndrome) ([place]) 04/13/2020   Encephalopathy 04/13/2020   Seizure ([place]) 04/10/2020   History of cerebrovascular accident (CVA) with residual deficit 03/19/2020   CKD (chronic kidney disease) stage 5, GFR less than 15 ml/min ([place]) 02/15/2020   Altered mental status 02/13/2020   Neuropathy in diabetes ([place])    Migraines    Hypertension    GERD (gastroesophageal reflux disease)    DM2 (diabetes mellitus, type 2) ([place])    Arthritis    Hyperlipidemia 08/25/2012   Chronic kidney disease, stage 4 (severe) ([place]) 08/23/2012   Tobacco abuse 08/23/2012   Malnutrition of moderate degree ([place]) 08/23/2012   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 05/21/2012    Past Surgical History:  Procedure Laterality Date   AV FISTULA PLACEMENT Left 04/25/2020   Procedure: LEFT ARM BRACHIOCEPHALIC ARTERIOVENOUS (AV) FISTULA CREATION;  Surgeon: Rosetta Posner, MD;  Location: [facility];  Service: Vascular;  Laterality: Left;   IR FLUORO GUIDE  CV LINE RIGHT  04/13/2020   IR REMOVAL TUN CV CATH W/O FL  01/14/2021   IR US GUIDE VASC ACCESS RIGHT  04/13/2020   KNEE ARTHROSCOPY Bilateral        Family History  Problem Relation Age of Onset   Other Mother    Stroke Father    Hypertension Father    Epilepsy Father    Breast cancer Sister    Stroke Brother    Heart attack Brother    Diabetes Sister     Social History   Tobacco Use   Smoking status: Every Day    Packs/day: 0.50     Types: Cigarettes   Smokeless tobacco: Never  Vaping Use   Vaping Use: Never used  Substance Use Topics   Alcohol use: No   Drug use: Yes    Types: Marijuana    Home Medications Prior to Admission medications   Medication Sig Start Date End Date Taking? Authorizing Provider  ACCU-CHEK AVIVA PLUS test strip USE 1 strip TO test blood sugar 2 TIMES DAILY TO 3 TIMES DAILY Patient not taking: Reported on 04/02/2021 06/10/19   [name], MD  acetaminophen (TYLENOL) 325 MG tablet Take 650 mg by mouth every 6 (six) hours as needed for moderate pain, fever or mild pain.    [provider]  bictegravir-emtricitabine-tenofovir AF (BIKTARVY) 50-200-25 MG TABS tablet Take 1 tablet by mouth daily. 05/01/20   Oswald Hillock, MD  blood glucose meter kit and supplies KIT Dispense based on patient and insurance preference. Check blood sugar 4 times a day Patient not taking: Reported on 04/02/2021 06/11/18   [name], MD  carvedilol (COREG) 12.5 MG tablet Take 12.5 mg by mouth in the morning and at bedtime.    [provider]  cetirizine (ZYRTEC) 10 MG tablet Take 10 mg by mouth daily.    [provider]  Cholecalciferol (VITAMIN D) 50 MCG (2000 UT) tablet Take 1 tablet (2,000 Units total) by mouth in the morning. 03/07/20   [name], MD  cloNIDine (CATAPRES) 0.1 MG tablet Take 1 tablet (0.1 mg total) by mouth 3 (three) times daily. 02/21/21   Hosie Poisson, MD  clopidogrel (PLAVIX) 75 MG tablet Take 1 tablet (75 mg total) by mouth daily. 03/07/20   [name], MD  Darbepoetin Alfa (ARANESP) 60 MCG/0.3ML SOSY injection Inject 0.3 mLs (60 mcg total) into the vein every Saturday with hemodialysis. Patient not taking: Reported on 04/02/2021 01/19/21   Bonnielee Haff, MD  doxepin (SINEQUAN) 75 MG capsule Take 75 mg by mouth at bedtime.    [provider]  EMOLLIENT EX Apply 1 application topically See admin instructions. Apply emollient lotion  topically to arms and legs  one time daily for dry skin    [provider]  epoetin alfa-epbx (RETACRIT) 49675 UNIT/ML injection Inject 20,000 Units into the vein once a week.    [provider]  insulin glargine (LANTUS) 100 unit/mL SOPN Inject 5 Units into the skin at bedtime.    [provider]  Insulin Pen Needle (SURE COMFORT PEN NEEDLES) 31G X 8 MM MISC Use as directed twice daily with Novolog flex pen Patient taking differently: 1 each by Other route See admin instructions. Use as directed twice daily with Novolog flex pen 02/10/19   Koberlein, [name], MD  levETIRAcetam (KEPPRA) 500 MG tablet Take 500 mg by mouth in the morning and at bedtime.    [provider]  Misc. Devices (COMMODE 3-IN-1) MISC Use as directed Patient not taking: Reported on 04/02/2021 02/24/20   [name], MD  Misc. Devices (TRANSFER BENCH) MISC Use as directed Patient not taking: Reported on 04/02/2021 02/24/20   [name], MD  Multiple Vitamin (MULTIVITAMIN WITH MINERALS) TABS tablet Take 1 tablet by mouth at bedtime.    [provider]  Nutritional Supplements (FEEDING SUPPLEMENT, NEPRO CARB STEADY,) LIQD Take 237 mLs by mouth 3 (three) times daily before meals.    [provider]  ondansetron (ZOFRAN) 4 MG tablet Take 4 mg by mouth every 4 (four) hours as needed for nausea/vomiting. 12/19/20   [provider]  pantoprazole (PROTONIX) 40 MG tablet TAKE 1 TABLET BY MOUTH EVERY DAY Patient taking differently: Take 40 mg by mouth daily. 01/23/20   [name], MD  pravastatin (PRAVACHOL) 80 MG tablet Take 1 tablet (80 mg total) by mouth at bedtime. 03/07/20   [name], MD  sulfamethoxazole-trimethoprim (BACTRIM DS) 800-160 MG tablet Take 1 tablet by mouth 3 (three) times a week. Patient taking differently: Take 1 tablet by mouth every Monday, Wednesday, and Friday. 04/23/20   Little Ishikawa, MD  tamsulosin (FLOMAX) 0.4 MG  CAPS capsule TAKE 1 CAPSULE BY MOUTH EVERY DAY Patient taking differently: Take 0.4 mg by mouth daily. 03/28/20   [name], MD  triamcinolone cream (KENALOG) 0.1 % Apply 1 application topically 2 (two) times daily.    [provider]  TRUEPLUS INSULIN SYRINGE 31G X 5/16" 1 ML MISC Use pen needle with insulin 2 times daily Patient not taking: Reported on 04/02/2021 03/31/19   [name], MD    Allergies    Oxycodone  Review of Systems   Review of Systems  Constitutional:  Negative for diaphoresis and fever.  HENT:  Negative for ear pain and facial swelling.   Eyes:  Negative for redness.  Respiratory:  Positive for shortness of breath. Negative for stridor.   Cardiovascular:  Negative for chest pain.  Gastrointestinal:  Negative for abdominal pain and vomiting.  Genitourinary:  Negative for flank pain.  Musculoskeletal:  Negative for neck stiffness.  Skin:  Negative for rash.  Neurological:  Negative for facial asymmetry.  Psychiatric/Behavioral:  Negative for agitation.   All other systems reviewed and are negative.  Physical Exam Updated Vital Signs BP (!) 231/120   Pulse (!) 113   Temp 98 F (36.7 C) (Oral)   Resp (!) 24   SpO2 100%   Physical Exam Vitals and nursing note reviewed.  Constitutional:      General: He is not in acute distress.    Appearance: Normal appearance.  HENT:     Head: Normocephalic and atraumatic.     Nose: Nose normal.  Eyes:     Conjunctiva/sclera: Conjunctivae normal.     Pupils: Pupils are equal, round, and reactive to light.  Cardiovascular:     Rate and Rhythm: Normal rate and regular rhythm.     Pulses: Normal pulses.     Heart sounds: Normal heart sounds.  Pulmonary:     Breath sounds: Rales present.  Abdominal:     General: Abdomen is flat. Bowel sounds are normal.     Palpations: Abdomen is soft.     Tenderness: There is no abdominal tenderness. There is no guarding.  Musculoskeletal:        General:  Normal range of motion.     Cervical back: Normal range of motion and neck  supple.  Skin:    General: Skin is warm and dry.     Capillary Refill: Capillary refill takes less than 2 seconds.  Neurological:     General: No focal deficit present.     Mental Status: He is alert and oriented to person, place, and time.     Deep Tendon Reflexes: Reflexes normal.  Psychiatric:        Mood and Affect: Mood normal.        Behavior: Behavior normal.    ED Results / Procedures / Treatments   Labs (all labs ordered are listed, but only abnormal results are displayed) Medications  nitroGLYCERIN (NITROGLYN) 2 % ointment 1 inch (1 inch Topical Given 04/14/21 0333)  sodium zirconium cyclosilicate (LOKELMA) packet 10 g (10 g Oral Given 04/14/21 0513)  hydrALAZINE (APRESOLINE) tablet 25 mg (25 mg Oral Given 04/14/21 0513)    EKG EKG Interpretation  Date/Time:  Sunday April 14 2021 03:14:04 EDT Ventricular Rate:  108 PR Interval:  170 QRS Duration: 93 QT Interval:  348 QTC Calculation: 467 R Axis:   58 Text Interpretation: Sinus tachycardia Biatrial enlargement LVH with secondary repolarization abnormality Confirmed by Randal Buba, April ([id number]) on 04/14/2021 4:13:41 AM  Radiology DG Chest Portable 1 View  Result Date: 04/14/2021 CLINICAL DATA:  Shortness of breath. EXAM: PORTABLE CHEST 1 VIEW COMPARISON:  April 02, 2021 FINDINGS: Mild areas of atelectasis and/or infiltrate are seen within the bilateral lung bases and mid left lung. There is no evidence of a pleural effusion or pneumothorax. The cardiac silhouette is borderline in size. The visualized skeletal structures are unremarkable. IMPRESSION: Mild bibasilar and mid left lung atelectasis and/or infiltrate. Sequelae associated with early pulmonary edema cannot be excluded. Electronically Signed   By: Virgina Norfolk M.D.   On: 04/14/2021 03:34    Procedures Procedures   Medications Ordered in ED Medications  sodium zirconium cyclosilicate  (LOKELMA) packet 10 g (has no administration in time range)  hydrALAZINE (APRESOLINE) tablet 25 mg (has no administration in time range)  nitroGLYCERIN (NITROGLYN) 2 % ointment 1 inch (1 inch Topical Given 04/14/21 0333)    ED Course  I have reviewed the triage vital signs and the nursing notes.  Pertinent labs & imaging results that were available during my care of the patient were reviewed by me and considered in my medical decision making (see chart for details).   450 am case d/w Dr. Jonnie Finner, who will see patient in the AM to arrange dialysis.  Called back, will need to be admitted to get dialysis in the room     I do not believe this is ACS.  I believe he is short of breat secondary to missed dialysis.  The patient's troponin is well below his baseline.  He is not having chest neck nor back pain.  No nausea vomiting or diaphoresis.  He is very comfortable at this point.    Final Clinical Impression(s) / ED Diagnoses Final diagnoses:  Acute pulmonary edema ([place])  ESRD (end stage renal disease) ([place])   Admit to medicine     Palumbo, April, MD 04/14/21 [phone]

## 2021-04-14 NOTE — Consult Note (Signed)
Renal Service Consult Note [facility]  Richard [name] 04/14/2021 Richard Blazing, MD Requesting Physician: dr. Tamala Julian, R.   Reason for Consult: ESRD pt w/ SOB HPI: The patient is a 65 y.o. year-old w/ hx of ESRD on HD, HTN, hx CVA, DM2, neuropathy who presented w/ SOB to ED early am today.  Missed last 2 HD sessions, as below. Also fell out of WC last week and was having rib and tailbone pain after that experience. Lives at a SNF.  EMS was called and SpO2 on RA was 83%.  In ED BP was high at 230/120, Hb 10, K 5.1  creat 14. COVID neg. CXR showed mild areas on infiltrate in bilat bases and L mid lung. Pt is admitted, asked to see for ESRD.      Pt felt SOB last night, but better this am.  Missed HD on Thursday and Sat this week, didn't want to go because the [facility] staff couldn't find his numbing cream.     ROS - denies CP, no joint pain, no HA, no blurry vision, no rash, no diarrhea, no nausea/ vomiting, no dysuria, no difficulty voiding   Past Medical History  Past Medical History:  Diagnosis Date   Arthritis    Chronic kidney disease    Diabetes mellitus, type II, insulin dependent (HCC)    GERD (gastroesophageal reflux disease)    Hypertension    Hypertensive emergency 05/21/2012   Immune deficiency disorder ([place])    Migraines    Neuropathy in diabetes ([place])    bilat feet   Ruptured lumbar disc    Stroke ([place]) 2013   residual right sided weakness (arm>leg)   Past Surgical History  Past Surgical History:  Procedure Laterality Date   AV FISTULA PLACEMENT Left 04/25/2020   Procedure: LEFT ARM BRACHIOCEPHALIC ARTERIOVENOUS (AV) FISTULA CREATION;  Surgeon: Rosetta Posner, MD;  Location: [facility] OR;  Service: Vascular;  Laterality: Left;   IR FLUORO GUIDE CV LINE RIGHT  04/13/2020   IR REMOVAL TUN CV CATH W/O FL  01/14/2021   IR US GUIDE VASC ACCESS RIGHT  04/13/2020   KNEE ARTHROSCOPY Bilateral    Family History  Family History  Problem Relation Age of Onset    Other Mother    Stroke Father    Hypertension Father    Epilepsy Father    Breast cancer Sister    Stroke Brother    Heart attack Brother    Diabetes Sister    Social History  reports that he has been smoking cigarettes. He has been smoking an average of .5 packs per day. He has never used smokeless tobacco. He reports current drug use. Drug: Marijuana. He reports that he does not drink alcohol. Allergies  Allergies  Allergen Reactions   Oxycodone Nausea And Vomiting   Home medications Prior to Admission medications   Medication Sig Start Date End Date Taking? Authorizing Provider  ACCU-CHEK AVIVA PLUS test strip USE 1 strip TO test blood sugar 2 TIMES DAILY TO 3 TIMES DAILY Patient not taking: Reported on 04/02/2021 06/10/19   [name], MD  acetaminophen (TYLENOL) 325 MG tablet Take 650 mg by mouth every 6 (six) hours as needed for moderate pain, fever or mild pain.    [provider]  bictegravir-emtricitabine-tenofovir AF (BIKTARVY) 50-200-25 MG TABS tablet Take 1 tablet by mouth daily. 05/01/20   Oswald Hillock, MD  blood glucose meter kit and supplies KIT Dispense based on patient and insurance preference.  Check blood sugar 4 times a day Patient not taking: Reported on 04/02/2021 06/11/18   [name], MD  carvedilol (COREG) 12.5 MG tablet Take 12.5 mg by mouth in the morning and at bedtime.    [provider]  cetirizine (ZYRTEC) 10 MG tablet Take 10 mg by mouth daily.    [provider]  Cholecalciferol (VITAMIN D) 50 MCG (2000 UT) tablet Take 1 tablet (2,000 Units total) by mouth in the morning. 03/07/20   [name], MD  cloNIDine (CATAPRES) 0.1 MG tablet Take 1 tablet (0.1 mg total) by mouth 3 (three) times daily. 02/21/21   Hosie Poisson, MD  clopidogrel (PLAVIX) 75 MG tablet Take 1 tablet (75 mg total) by mouth daily. 03/07/20   [name], MD  Darbepoetin Alfa (ARANESP) 60 MCG/0.3ML SOSY injection Inject 0.3 mLs (60 mcg  total) into the vein every Saturday with hemodialysis. Patient not taking: Reported on 04/02/2021 01/19/21   Bonnielee Haff, MD  doxepin (SINEQUAN) 75 MG capsule Take 75 mg by mouth at bedtime.    [provider]  EMOLLIENT EX Apply 1 application topically See admin instructions. Apply emollient lotion topically to arms and legs  one time daily for dry skin    [provider]  epoetin alfa-epbx (RETACRIT) 84132 UNIT/ML injection Inject 20,000 Units into the vein once a week.    [provider]  insulin glargine (LANTUS) 100 unit/mL SOPN Inject 5 Units into the skin at bedtime.    [provider]  Insulin Pen Needle (SURE COMFORT PEN NEEDLES) 31G X 8 MM MISC Use as directed twice daily with Novolog flex pen Patient taking differently: 1 each by Other route See admin instructions. Use as directed twice daily with Novolog flex pen 02/10/19   Koberlein, [name], MD  levETIRAcetam (KEPPRA) 500 MG tablet Take 500 mg by mouth in the morning and at bedtime.    [provider]  Misc. Devices (COMMODE 3-IN-1) MISC Use as directed Patient not taking: Reported on 04/02/2021 02/24/20   [name], MD  Misc. Devices (TRANSFER BENCH) MISC Use as directed Patient not taking: Reported on 04/02/2021 02/24/20   [name], MD  Multiple Vitamin (MULTIVITAMIN WITH MINERALS) TABS tablet Take 1 tablet by mouth at bedtime.    [provider]  Nutritional Supplements (FEEDING SUPPLEMENT, NEPRO CARB STEADY,) LIQD Take 237 mLs by mouth 3 (three) times daily before meals.    [provider]  ondansetron (ZOFRAN) 4 MG tablet Take 4 mg by mouth every 4 (four) hours as needed for nausea/vomiting. 12/19/20   [provider]  pantoprazole (PROTONIX) 40 MG tablet TAKE 1 TABLET BY MOUTH EVERY DAY Patient taking differently: Take 40 mg by mouth daily. 01/23/20   [name], MD  pravastatin (PRAVACHOL) 80 MG tablet Take 1 tablet (80 mg total) by  mouth at bedtime. 03/07/20   [name], MD  sulfamethoxazole-trimethoprim (BACTRIM DS) 800-160 MG tablet Take 1 tablet by mouth 3 (three) times a week. Patient taking differently: Take 1 tablet by mouth every Monday, Wednesday, and Friday. 04/23/20   Little Ishikawa, MD  tamsulosin (FLOMAX) 0.4 MG CAPS capsule TAKE 1 CAPSULE BY MOUTH EVERY DAY Patient taking differently: Take 0.4 mg by mouth daily. 03/28/20   [name], MD  triamcinolone cream (KENALOG) 0.1 % Apply 1 application topically 2 (two) times daily.    [provider]  TRUEPLUS INSULIN SYRINGE 31G X 5/16" 1 ML MISC Use  pen needle with insulin 2 times daily Patient not taking: Reported on 04/02/2021 03/31/19   [name], MD     Vitals:   04/14/21 1130 04/14/21 1156 04/14/21 1226 04/14/21 1300  BP: (!) 213/109 (!) 193/117 (!) 191/94 (!) 184/89  Pulse:    90  Resp:      Temp:      TempSrc:      SpO2:    99%  Weight:      Height:       Exam Gen alert, no distress No rash, cyanosis or gangrene Sclera anicteric, throat clear  No jvd or bruits Chest clear bilat to bases, no rales/ wheezing RRR no MRG Abd soft ntnd no mass or ascites +bs GU normal MS no joint effusions or deformity Ext trace LE edema, no wounds or ulcers Neuro is alert, Ox 3 , nf  L AVF +bruit       OP HD: [place] [facility]   4h  400/800   67.5kg  2/2 bath  AVF  Hep 1200   - no other meds    Assessment/ Plan: SOB - CXR borderline for convincing edema, not in distress, but did miss 2 Hd session and is up 7kg by wts. Plan for HD tonight, or at the latest in the morning tomorrow/ Monday.   ESRD - on HD TTS. Missed last 2 HD sessions as outpt. As above.  DM2 Anemia ckd - Hb 10- 12 MBD ckd - Ca wnl, cont binders Hx of CVA      Rob Nylen Creque  MD 04/14/2021, 2:49 PM  Recent Labs  Lab 04/14/21 0400 04/14/21 0456  WBC 9.7  --   HGB 10.9* 11.9*   Recent Labs  Lab 04/14/21 0400 04/14/21 0456  K 5.1 5.2*  BUN 76*  79*  CREATININE 14.03* 14.40*  CALCIUM 8.6*  --

## 2021-04-14 NOTE — ED Notes (Signed)
Attempted report 

## 2021-04-14 NOTE — H&P (Signed)
History and Physical    Richard [name] SFK:[id number] DOB: Feb 07, 1956 DOA: 04/14/2021  Referring MD/NP/PA: April Palumbo, MD PCP: [name], MD  Patient coming from: [place] via EMS  Chief Complaint: Shortness of breath  I have personally briefly reviewed patient's old medical records in [place]   HPI: Richard [name] is a 65 y.o. male with medical history significant of HTN, ESRD on HD, DM2, CVA, and HIV presents with complaints of shortness of breath this morning.  Overnight patient reported that he was unable to sleep due to feeling as though he could not breathe.  Denied having any significant chest pain, fever, nausea, vomiting, or diarrhea.  He had not had dialysis yesterday due to him losing his numbing cream.  Last dialysis session was 3 days ago.  He also notes that last week all staff was transferring him to his wheelchair he was dropped.  Complains of having right sided rib pain and tailbone pain.  His right lower rib is tender to the touch.  He states that he has blisters on his buttocks where he was dropped, but nursing present in the room and states that he had been examined and had no blisters present.  He had asked staff members this morning to place him on oxygen for which EMS had been called.  In route with EMS patient O2 saturation was noted to be 83% room air and he was placed on 3 L nasal cannula.  He had last been admitted for acute hypoxic respiratory failure in the setting of fluid overload  5/29-  ED Course: Upon admission into the emergency department patient was seen to be afebrile, pulse 100-1 13, respiration 14-24, blood pressures elevated up to 231/120, and O2 saturations maintained.  Labs significant for hemoglobin 10.9, potassium 5.1, CO2 19, BUN 76, creatinine 14.03, calcium 8.6, anion gap 16, BNP 2190.5, and HS-troponin 78.  Influenza and COVID-19 screening were negative.  Chest x-ray noted mild bibasilar to mid left lung  atelectasis and/or infiltrate for which early pulmonary edema could not be excluded.  Nephrology had been formally consulted.  While in the ED multiple attempts were performed to obtain IV access unsuccessfully.  Patient had been given 10 g of Lokelma, 1 inch of nitroglycerin paste,  hydralazine 25 mg po, and Coreg 12.5 mg.  TRH called to admit and order placed for IV team  Review of Systems  Constitutional:  Negative for fever.  HENT:  Negative for ear discharge and ear pain.   Eyes:  Negative for pain and redness.  Respiratory:  Positive for shortness of breath.   Cardiovascular:  Negative for chest pain and leg swelling.       Positive for right-sided rib pain  Gastrointestinal:  Negative for abdominal pain, nausea and vomiting.  Musculoskeletal:  Positive for falls.       Positive for right-sided rib pain  Skin:  Negative for rash.  Neurological:  Negative for loss of consciousness.  Psychiatric/Behavioral:  The patient has insomnia.    Past Medical History:  Diagnosis Date   Arthritis    Diabetes mellitus, type II, insulin dependent (HCC)    GERD (gastroesophageal reflux disease)    Hypertension    Hypertensive emergency 05/21/2012   Immune deficiency disorder ([place])    Migraines    Neuropathy in diabetes ([place])    bilat feet   Ruptured lumbar disc    Stroke ([place]) 2013   residual right sided weakness (arm>leg)    Past  Surgical History:  Procedure Laterality Date   AV FISTULA PLACEMENT Left 04/25/2020   Procedure: LEFT ARM BRACHIOCEPHALIC ARTERIOVENOUS (AV) FISTULA CREATION;  Surgeon: Rosetta Posner, MD;  Location: [facility] OR;  Service: Vascular;  Laterality: Left;   IR FLUORO GUIDE CV LINE RIGHT  04/13/2020   IR REMOVAL TUN CV CATH W/O FL  01/14/2021   IR US GUIDE VASC ACCESS RIGHT  04/13/2020   KNEE ARTHROSCOPY Bilateral      reports that he has been smoking cigarettes. He has been smoking an average of .5 packs per day. He has never used smokeless tobacco. He reports current drug  use. Drug: Marijuana. He reports that he does not drink alcohol.  Allergies  Allergen Reactions   Oxycodone Nausea And Vomiting    Family History  Problem Relation Age of Onset   Other Mother    Stroke Father    Hypertension Father    Epilepsy Father    Breast cancer Sister    Stroke Brother    Heart attack Brother    Diabetes Sister     Prior to Admission medications   Medication Sig Start Date End Date Taking? Authorizing Provider  ACCU-CHEK AVIVA PLUS test strip USE 1 strip TO test blood sugar 2 TIMES DAILY TO 3 TIMES DAILY Patient not taking: Reported on 04/02/2021 06/10/19   [name], MD  acetaminophen (TYLENOL) 325 MG tablet Take 650 mg by mouth every 6 (six) hours as needed for moderate pain, fever or mild pain.    [provider]  bictegravir-emtricitabine-tenofovir AF (BIKTARVY) 50-200-25 MG TABS tablet Take 1 tablet by mouth daily. 05/01/20   Oswald Hillock, MD  blood glucose meter kit and supplies KIT Dispense based on patient and insurance preference. Check blood sugar 4 times a day Patient not taking: Reported on 04/02/2021 06/11/18   [name], MD  carvedilol (COREG) 12.5 MG tablet Take 12.5 mg by mouth in the morning and at bedtime.    [provider]  cetirizine (ZYRTEC) 10 MG tablet Take 10 mg by mouth daily.    [provider]  Cholecalciferol (VITAMIN D) 50 MCG (2000 UT) tablet Take 1 tablet (2,000 Units total) by mouth in the morning. 03/07/20   [name], MD  cloNIDine (CATAPRES) 0.1 MG tablet Take 1 tablet (0.1 mg total) by mouth 3 (three) times daily. 02/21/21   Hosie Poisson, MD  clopidogrel (PLAVIX) 75 MG tablet Take 1 tablet (75 mg total) by mouth daily. 03/07/20   [name], MD  Darbepoetin Alfa (ARANESP) 60 MCG/0.3ML SOSY injection Inject 0.3 mLs (60 mcg total) into the vein every Saturday with hemodialysis. Patient not taking: Reported on 04/02/2021 01/19/21   Bonnielee Haff, MD  doxepin (SINEQUAN)  75 MG capsule Take 75 mg by mouth at bedtime.    [provider]  EMOLLIENT EX Apply 1 application topically See admin instructions. Apply emollient lotion topically to arms and legs  one time daily for dry skin    [provider]  epoetin alfa-epbx (RETACRIT) 76195 UNIT/ML injection Inject 20,000 Units into the vein once a week.    [provider]  insulin glargine (LANTUS) 100 unit/mL SOPN Inject 5 Units into the skin at bedtime.    [provider]  Insulin Pen Needle (SURE COMFORT PEN NEEDLES) 31G X 8 MM MISC Use as directed twice daily with Novolog flex pen Patient taking differently: 1 each by Other route See admin instructions. Use as directed twice daily  with Novolog flex pen 02/10/19   Koberlein, [name], MD  levETIRAcetam (KEPPRA) 500 MG tablet Take 500 mg by mouth in the morning and at bedtime.    [provider]  Misc. Devices (COMMODE 3-IN-1) MISC Use as directed Patient not taking: Reported on 04/02/2021 02/24/20   [name], MD  Misc. Devices (TRANSFER BENCH) MISC Use as directed Patient not taking: Reported on 04/02/2021 02/24/20   [name], MD  Multiple Vitamin (MULTIVITAMIN WITH MINERALS) TABS tablet Take 1 tablet by mouth at bedtime.    [provider]  Nutritional Supplements (FEEDING SUPPLEMENT, NEPRO CARB STEADY,) LIQD Take 237 mLs by mouth 3 (three) times daily before meals.    [provider]  ondansetron (ZOFRAN) 4 MG tablet Take 4 mg by mouth every 4 (four) hours as needed for nausea/vomiting. 12/19/20   [provider]  pantoprazole (PROTONIX) 40 MG tablet TAKE 1 TABLET BY MOUTH EVERY DAY Patient taking differently: Take 40 mg by mouth daily. 01/23/20   [name], MD  pravastatin (PRAVACHOL) 80 MG tablet Take 1 tablet (80 mg total) by mouth at bedtime. 03/07/20   [name], MD  sulfamethoxazole-trimethoprim (BACTRIM DS) 800-160 MG tablet Take 1 tablet by mouth 3 (three)  times a week. Patient taking differently: Take 1 tablet by mouth every Monday, Wednesday, and Friday. 04/23/20   Little Ishikawa, MD  tamsulosin (FLOMAX) 0.4 MG CAPS capsule TAKE 1 CAPSULE BY MOUTH EVERY DAY Patient taking differently: Take 0.4 mg by mouth daily. 03/28/20   [name], MD  triamcinolone cream (KENALOG) 0.1 % Apply 1 application topically 2 (two) times daily.    [provider]  TRUEPLUS INSULIN SYRINGE 31G X 5/16" 1 ML MISC Use pen needle with insulin 2 times daily Patient not taking: Reported on 04/02/2021 03/31/19   [name], MD    Physical Exam:  Constitutional: Older male currently in no acute distress Vitals:   04/14/21 0430 04/14/21 0500 04/14/21 0630 04/14/21 0645  BP: (!) 228/131 (!) 214/134 (!) 199/128 (!) 212/120  Pulse: 100 (!) 105 (!) 102 (!) 105  Resp: '14 19 17 ' (!) 21  Temp:      TempSrc:      SpO2: 100% 99% 98% 98%   Eyes: PERRL, lids and conjunctivae normal ENMT: Mucous membranes are moist. Posterior pharynx clear of any exudate or lesions.   Neck: normal, supple, no masses, no thyromegaly Respiratory: Decreased overall aeration with head no significant wheezes appreciated.  Patient on 3 L nasal cannula oxygen with O2 saturations maintained. Cardiovascular: Decreased overall aeration with no significant wheezes appreciated.  No significant lower extremity edema.  AV fistula present of the left upper arm with palpable thrill.  Tenderness to palpation over the right lower rib cage. Abdomen: no tenderness, no masses palpated. No hepatosplenomegaly. Bowel sounds positive.  Musculoskeletal: no clubbing / cyanosis. No joint deformity upper and lower extremities.  Skin: no rashes, lesions, ulcers. No induration Neurologic: CN 2-12 grossly intact. Sensation intact, DTR normal. Strength 5/5 in all 4.  Psychiatric: Normal judgment and insight. Alert and oriented x 3. Normal mood.     Labs on Admission: I have personally reviewed  following labs and imaging studies  CBC: Recent Labs  Lab 04/14/21 0400 04/14/21 0456  WBC 9.7  --   NEUTROABS 7.7  --   HGB 10.9* 11.9*  HCT 34.5* 35.0*  MCV 98.9  --   PLT 228  --    Basic  Metabolic Panel: Recent Labs  Lab 04/14/21 0400 04/14/21 0456  NA 138 140  K 5.1 5.2*  CL 103 109  CO2 19*  --   GLUCOSE 127* 122*  BUN 76* 79*  CREATININE 14.03* 14.40*  CALCIUM 8.6*  --    GFR: CrCl cannot be calculated (Unknown ideal weight.). Liver Function Tests: Recent Labs  Lab 04/14/21 0400  AST 18  ALT 14  ALKPHOS 93  BILITOT 0.6  PROT 7.3  ALBUMIN 3.4*   No results for input(s): LIPASE, AMYLASE in the last 168 hours. No results for input(s): AMMONIA in the last 168 hours. Coagulation Profile: No results for input(s): INR, PROTIME in the last 168 hours. Cardiac Enzymes: No results for input(s): CKTOTAL, CKMB, CKMBINDEX, TROPONINI in the last 168 hours. BNP (last 3 results) No results for input(s): PROBNP in the last 8760 hours. HbA1C: No results for input(s): HGBA1C in the last 72 hours. CBG: No results for input(s): GLUCAP in the last 168 hours. Lipid Profile: No results for input(s): CHOL, HDL, LDLCALC, TRIG, CHOLHDL, LDLDIRECT in the last 72 hours. Thyroid Function Tests: No results for input(s): TSH, T4TOTAL, FREET4, T3FREE, THYROIDAB in the last 72 hours. Anemia Panel: No results for input(s): VITAMINB12, FOLATE, FERRITIN, TIBC, IRON, RETICCTPCT in the last 72 hours. Urine analysis:    Component Value Date/Time   COLORURINE STRAW (A) 04/11/2020 0223   APPEARANCEUR CLEAR 04/11/2020 0223   LABSPEC 1.011 04/11/2020 0223   PHURINE 5.0 04/11/2020 0223   GLUCOSEU 50 (A) 04/11/2020 0223   HGBUR SMALL (A) 04/11/2020 0223   BILIRUBINUR NEGATIVE 04/11/2020 0223   KETONESUR NEGATIVE 04/11/2020 0223   PROTEINUR >=300 (A) 04/11/2020 0223   UROBILINOGEN 0.2 11/29/2014 2003   NITRITE NEGATIVE 04/11/2020 0223   LEUKOCYTESUR NEGATIVE 04/11/2020 0223   Sepsis  Labs: Recent Results (from the past 240 hour(s))  Resp Panel by RT-PCR (Flu A&B, Covid) Nasopharyngeal Swab     Status: None   Collection Time: 04/14/21  3:05 AM   Specimen: Nasopharyngeal Swab; Nasopharyngeal(NP) swabs in vial transport medium  Result Value Ref Range Status   SARS Coronavirus 2 by RT PCR NEGATIVE NEGATIVE Final    Comment: (NOTE) SARS-CoV-2 target nucleic acids are NOT DETECTED.  The SARS-CoV-2 RNA is generally detectable in upper respiratory specimens during the acute phase of infection. The lowest concentration of SARS-CoV-2 viral copies this assay can detect is 138 copies/mL. A negative result does not preclude SARS-Cov-2 infection and should not be used as the sole basis for treatment or other patient management decisions. A negative result may occur with  improper specimen collection/handling, submission of specimen other than nasopharyngeal swab, presence of viral mutation(s) within the areas targeted by this assay, and inadequate number of viral copies(<138 copies/mL). A negative result must be combined with clinical observations, patient history, and epidemiological information. The expected result is Negative.  Fact Sheet for Patients:  EntrepreneurPulse.com.au  Fact Sheet for Healthcare Providers:  IncredibleEmployment.be  This test is no t yet approved or cleared by the [place] FDA and  has been authorized for detection and/or diagnosis of SARS-CoV-2 by FDA under an Emergency Use Authorization (EUA). This EUA will remain  in effect (meaning this test can be used) for the duration of the COVID-19 declaration under Section 564(b)(1) of the Act, 21 U.S.C.section 360bbb-3(b)(1), unless the authorization is terminated  or revoked sooner.       Influenza A by PCR NEGATIVE NEGATIVE Final   Influenza B by PCR NEGATIVE NEGATIVE Final  Comment: (NOTE) The Xpert Xpress SARS-CoV-2/FLU/RSV plus assay is intended as  an aid in the diagnosis of influenza from Nasopharyngeal swab specimens and should not be used as a sole basis for treatment. Nasal washings and aspirates are unacceptable for Xpert Xpress SARS-CoV-2/FLU/RSV testing.  Fact Sheet for Patients: EntrepreneurPulse.com.au  Fact Sheet for Healthcare Providers: IncredibleEmployment.be  This test is not yet approved or cleared by the [place] FDA and has been authorized for detection and/or diagnosis of SARS-CoV-2 by FDA under an Emergency Use Authorization (EUA). This EUA will remain in effect (meaning this test can be used) for the duration of the COVID-19 declaration under Section 564(b)(1) of the Act, 21 U.S.C. section 360bbb-3(b)(1), unless the authorization is terminated or revoked.  Performed at [facility], [place] [address], [place], [place] [address]      Radiological Exams on Admission: DG Chest Portable 1 View  Result Date: 04/14/2021 CLINICAL DATA:  Shortness of breath. EXAM: PORTABLE CHEST 1 VIEW COMPARISON:  April 02, 2021 FINDINGS: Mild areas of atelectasis and/or infiltrate are seen within the bilateral lung bases and mid left lung. There is no evidence of a pleural effusion or pneumothorax. The cardiac silhouette is borderline in size. The visualized skeletal structures are unremarkable. IMPRESSION: Mild bibasilar and mid left lung atelectasis and/or infiltrate. Sequelae associated with early pulmonary edema cannot be excluded. Electronically Signed   By: Virgina Norfolk M.D.   On: 04/14/2021 03:34    EKG: Independently reviewed.  Sinus tachycardia 108 bpm with biatrial enlargement  Assessment/Plan Acute respiratory failure with hypoxia secondary to volume overload ESRD on HD acute on chronic diastolic congestive heart failure exacerbation: Patient was noted to have O2 saturations as low as 89% on room air with EMS and was initially placed on nasal cannula oxygen.  Last EF was  noted to be 50 to 55% with grade 2 diastolic dysfunction. Chest x-ray concerning for pulmonary edema given elevated BNP of 2190.5.  Nephrology have been formally consulted and plan on dialyzing the patient. He had missed hemodialysis the 7/22. -Admit to a progressive bed -Continue nasal cannula oxygen to maintain O2 saturations as needed -Renal/carb modified diet with fluid restriction -HD per nephrology consultative services for fluid management  Hypertensive urgency: Acute.  Blood pressures elevated to 231/120.  Home blood pressure regimen includes Coreg 25 mg twice daily and clonidine 0.1 mg 3 times daily. -Resume home blood pressure regimen -Hydralazine IV as needed  Elevated troponin: Chronic.  High-sensitivity troponin 78, but previously have been elevated into the 200s back in May.  EKG without significant ischemic changes.  Patient denies any complaints of chest pain.  Suspect secondary to demand in the setting of fluid overload and hypertensive urgency. -Consider need to recheck cardiac troponin if patient complains of chest pain  Metabolic acidosis with elevated anion gap: On admission CO2 19 with anion gap 16.  Suspect secondary to patient's kidney disease. -Continue to monitor  Diabetes mellitus type 2: Last hemoglobin A1c was 5.6 on 01/13/2021.  Home insulin regimen includes Lantus 5 units nightly. -Hypoglycemic protocol -Reduce Lantus to 2 units nightly while here in the hospital  -CBGs before every meal with very sensitive SSI -Adjust regimen as needed or discontinue CBGs if not requiring significant amount of insulin  Anemia of chronic disease: Hemoglobin 10.9 g/dL which appears around patient's baseline. 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 -Continue home antiviral medications -Continue outpatient follow-up with ID  Seizure disorder: No reported seizures -  Continue Keppra 500 mg twice daily  Anemia chronic disease: Hemoglobin 10.9  which appears to be relatively stable. -Continue to monitor   History of CVA -Continue Plavix and statin  Hyperlipidemia: -Continue pravastatin  DVT prophylaxis: heparin Code Status: Full Family Communication: None requested Disposition Plan: Likely discharge back to rehab once medically Consults called: Nephrology Admission status: Inpatient, patient to require more than 2 midnight stay for need of dialysis Richard Morton MD [facility]   If 7PM-7AM, please contact night-coverage   04/14/2021, 7:07 AM

## 2021-04-15 DIAGNOSIS — J9621 Acute and chronic respiratory failure with hypoxia: Secondary | ICD-10-CM

## 2021-04-15 LAB — GLUCOSE, CAPILLARY
Glucose-Capillary: 75 mg/dL (ref 70–99)
Glucose-Capillary: 86 mg/dL (ref 70–99)
Glucose-Capillary: 121 mg/dL — ABNORMAL HIGH (ref 70–99)
Glucose-Capillary: 213 mg/dL — ABNORMAL HIGH (ref 70–99)

## 2021-04-15 MED ORDER — ALUM & MAG HYDROXIDE-SIMETH 200-200-20 MG/5ML PO SUSP
30.0000 mL | ORAL | Status: DC | PRN
  Administered 2021-04-15: 30 mL via ORAL
  Filled 2021-04-15: qty 30

## 2021-04-15 NOTE — Progress Notes (Signed)
Occupational Therapy Evaluation Patient Details Name: Richard Richard [name] MRN: [id number] DOB: 12-11-1955 Today's Date: 04/15/2021    History of Present Illness Pt is a 65 y.o. male admitted from SNF with worsening SOB. PMH includes CVA (residual R-side weakness), ESRD on HD, HTN, DM.   Clinical Impression   Richard [name] was evaluated s/p the above impairments and SOB. PTA pt was at a SNF where he received assistance for all mobility and ADLs. Upon evaluation, pt is min A for bed mobility with good sitting balance. Upper body ADLs are mod A due to RUE hemiparesis, and lower body Adls are mod-max A +2 due to weakness and R hemi. Pt reports he is completing ADLs close to his normal baseline. Pt would benefit from continued OT acutely should his length of stay continue. Recommend pt discharge back to SNF with follow up therapies.     Follow Up Recommendations  SNF;Supervision/Assistance - 24 hour    Equipment Recommendations  None recommended by OT       Precautions / Restrictions Precautions Precautions: Fall Precaution Comments: H/o residual R-side weakness/contracture (<3/5 throughout) Restrictions Weight Bearing Restrictions: No      Mobility Bed Mobility Overal bed mobility: Needs Assistance Bed Mobility: Supine to Sit;Sit to Supine     Supine to sit: Min assist Sit to supine: Min assist   General bed mobility comments: Requires assist to raise trunk to seated position, then required assist to bring R LE back up onto bed.    Transfers Overall transfer level: Needs assistance Equipment used: 1 person hand held assist Transfers: Sit to/from Stand Sit to Stand: Mod assist         General transfer comment: pt declined OOB transfer this session    Balance Overall balance assessment: Needs assistance Sitting-balance support: Feet supported Sitting balance-Leahy Scale: Good     Standing balance support: Single extremity supported;During functional activity Standing  balance-Leahy Scale: Fair Standing balance comment: requires assistance for balance and to get standing.           ADL either performed or assessed with clinical judgement   ADL Overall ADL's : Needs assistance/impaired Eating/Feeding: Set up;Sitting   Grooming: Set up;Sitting   Upper Body Bathing: Moderate assistance;Sitting   Lower Body Bathing: Bed level;Maximal assistance   Upper Body Dressing : Moderate assistance;Sitting   Lower Body Dressing: Maximal assistance;Bed level   Toilet Transfer: Maximal assistance;+2 for physical assistance;+2 for safety/equipment;Stand-pivot;BSC   Toileting- Clothing Manipulation and Hygiene: Maximal assistance;Sitting/lateral lean       Functional mobility during ADLs: Maximal assistance;+2 for physical assistance;+2 for safety/equipment General ADL Comments: Pt reports that he is performing ADLs near his normal baseline     Vision Baseline Vision/History: No visual deficits Patient Visual Report: No change from baseline Vision Assessment?: No apparent visual deficits            Pertinent Vitals/Pain Pain Assessment: Faces Faces Pain Scale: Hurts a little bit Pain Location: right side due to recent fall at SNF trying to get into wheelchair with staff Pain Descriptors / Indicators: Discomfort;Sore Pain Intervention(s): Limited activity within patient's tolerance;Monitored during session     Hand Dominance Right (must do everything with L s/p CVA)   Extremity/Trunk Assessment Upper Extremity Assessment Upper Extremity Assessment: RUE deficits/detail RUE Deficits / Details: s/p previous CVA wrist flexed, hand in fist, 0-100 elbowe flex/ext; shoulder flex to 80* RUE Sensation: WNL RUE Coordination: decreased fine motor;decreased gross motor   Lower Extremity Assessment Lower Extremity Assessment: RLE deficits/detail RLE  Deficits / Details: flexor/extensor tone with mobility RLE Coordination: decreased gross motor   Cervical  / Trunk Assessment Cervical / Trunk Assessment: Normal   Communication Communication Communication: No difficulties   Cognition Arousal/Alertness: Awake/alert Behavior During Therapy: WFL for tasks assessed/performed Overall Cognitive Status: Within Functional Limits for tasks assessed           General Comments: Patient pleasant able to carry conversation.   General Comments  VSS on RA, pt resquests to stay one more night in hospital and return to SNF after dialysis tomorrow            Home Living Family/patient expects to be discharged to:: Skilled nursing facility     Additional Comments: From [name], but reports he does not want to go back      Prior Functioning/Environment Level of Independence: Needs assistance  Gait / Transfers Assistance Needed: Reports he needs 1-2 person assist to transfer to [facility]. Is able to self propel WC ADL's / Homemaking Assistance Needed: Pt reports he requires assist with ADLs, sponges bathes and staff assists completely with dressing            OT Problem List: Decreased strength;Decreased activity tolerance;Impaired balance (sitting and/or standing);Decreased coordination;Pain;Impaired UE functional use;Increased edema;Cardiopulmonary status limiting activity;Decreased cognition;Decreased safety awareness      OT Treatment/Interventions: Self-care/ADL training;Therapeutic exercise;Neuromuscular education;Energy conservation;DME and/or AE instruction;Therapeutic activities;Cognitive remediation/compensation;Patient/family education;Balance training;Visual/perceptual remediation/compensation    OT Goals(Current goals can be found in the care plan section) Acute Rehab OT Goals Patient Stated Goal: Wishes to stay one more night acutely and return to SNF tomorrow after dialysis OT Goal Formulation: With patient Time For Goal Achievement: 03/05/21 Potential to Achieve Goals: Good ADL Goals Pt Will Perform Lower Body Dressing: with  min assist;sitting/lateral leans Pt Will Transfer to Toilet: with min assist;squat pivot transfer;bedside commode Additional ADL Goal #1: Pt will complete functional squat pivot surface transfers with min guard A in preparation to complete seated ADLs at the sink.  OT Frequency: Min 2X/week    AM-PAC OT "6 Clicks" Daily Activity     Outcome Measure Help from another person eating meals?: A Little Help from another person taking care of personal grooming?: A Little Help from another person toileting, which includes using toliet, bedpan, or urinal?: A Lot Help from another person bathing (including washing, rinsing, drying)?: A Lot Help from another person to put on and taking off regular upper body clothing?: A Lot Help from another person to put on and taking off regular lower body clothing?: A Lot 6 Click Score: 14   End of Session    Activity Tolerance: Patient tolerated treatment well Patient left: in bed;with call bell/phone within reach (chair position in preparation for lunch)  OT Visit Diagnosis: Unsteadiness on feet (R26.81);Muscle weakness (generalized) (M62.81);Pain                Time: 1300-1320 OT Time Calculation (min): 20 min Charges:  OT General Charges $OT Visit: 1 Visit    Richard Richard [name] A Keeghan Mcintire 04/15/2021, 1:28 PM

## 2021-04-15 NOTE — Plan of Care (Signed)
  Problem: Clinical Measurements: Goal: Complications related to the disease process, condition or treatment will be avoided or minimized Outcome: Progressing   Problem: Clinical Measurements: Goal: Ability to maintain clinical measurements within normal limits will improve Outcome: Progressing Goal: Will remain free from infection Outcome: Progressing Goal: Diagnostic test results will improve Outcome: Progressing Goal: Respiratory complications will improve Outcome: Progressing Goal: Cardiovascular complication will be avoided Outcome: Progressing

## 2021-04-15 NOTE — Progress Notes (Signed)
Attempted to call report to nurse at [facility] who will be receiving patient.

## 2021-04-15 NOTE — TOC Transition Note (Addendum)
Transition of Care [facility]) - CM/SW Discharge Note   Patient Details  Name: Richard [name] MRN: [id number] Date of Birth: 1955/10/08  Transition of Care [facility]) CM/SW Contact:  Trula Ore, [place] Phone Number: 04/15/2021, 12:30 PM   Clinical Narrative:     Patient will DC to: [place]  Anticipated DC date: 04/15/2021  Family notified: [name]   Transport by: Corey Harold   ?  Per MD patient ready for DC to [facility]. RN, patient, patient's family,renal navigator Pam, and facility notified of DC. Discharge Summary sent to facility. RN given number for report tele#[phone] RM# [phone]. DC packet on chart. Ambulance transport requested for patient.  CSW signing off.   Final next level of care: Skilled Nursing Facility Barriers to Discharge: No Barriers Identified   Patient Goals and CMS Choice Patient states their goals for this hospitalization and ongoing recovery are:: to go to SNF CMS Medicare.gov Compare Post Acute Care list provided to:: Patient Choice offered to / list presented to : Patient  Discharge Placement              Patient chooses bed at:  [facility]) Patient to be transferred to facility by: [place] Name of family member notified: [name] Patient and family notified of of transfer: 04/15/21  Discharge Plan and Services In-house Referral: Clinical Social Work                                   Social Determinants of Health ([place]) Interventions     Readmission Risk Interventions Readmission Risk Prevention Plan 02/15/2020  Transportation Screening Complete  PCP or Specialist Appt within 5-7 Days Not Complete  Not Complete comments pending disposition  Home Care Screening Complete  Medication Review (RN CM) Referral to Pharmacy  Some recent data might be hidden

## 2021-04-15 NOTE — Evaluation (Signed)
Physical Therapy Evaluation Patient Details Name: Richard [name] MRN: [id number] DOB: [birth year] Today's Date: 04/15/2021   History of Present Illness  Pt is a 65 y.o. male admitted from SNF with worsening SOB. PMH includes CVA (residual R-side weakness), ESRD on HD, HTN, DM.  Clinical Impression  Patient received in bed, he is agreeable to PT session. Wants to rest, states he cannot rest at facility where he came from due to bed being very uncomfortable. Patient requires min/mod assist to raise trunk to seated position in bed. Once positioned he is able to sit edge of bed unsupported. Patient performed sit to stand with L UE assist onto scale x 2 reps with mod assist. Patient returned to bed with min assist to bring R LE back up onto bed. Patient will continue to benefit from skilled PT while here to improve functional independence and safety with mobility.     Follow Up Recommendations SNF;Supervision for mobility/OOB    Equipment Recommendations  None recommended by PT    Recommendations for Other Services       Precautions / Restrictions Precautions Precautions: Fall Precaution Comments: H/o residual R-side weakness/contracture (<3/5 throughout) Restrictions Weight Bearing Restrictions: No      Mobility  Bed Mobility Overal bed mobility: Needs Assistance Bed Mobility: Supine to Sit;Sit to Supine     Supine to sit: Min assist Sit to supine: Min assist   General bed mobility comments: Requires assist to raise trunk to seated position, then required assist to bring R LE back up onto bed.    Transfers Overall transfer level: Needs assistance Equipment used: 1 person hand held assist Transfers: Sit to/from Stand Sit to Stand: Mod assist         General transfer comment: Patient's MD requested for him to be weighed on scale. Patient required mod assist to get up onto scale x2. He is able to pull with L UE. Able to stand with min guard holding to  scale.  Ambulation/Gait             General Gait Details: unable at this time. Patient reports when he was working with PT at [facility] he was able to walk some with hemiwalker. PT stopped working with him.  Stairs            Wheelchair Mobility    Modified Rankin (Stroke Patients Only)       Balance Overall balance assessment: Needs assistance Sitting-balance support: Feet supported Sitting balance-Leahy Scale: Good     Standing balance support: Single extremity supported;During functional activity Standing balance-Leahy Scale: Fair Standing balance comment: requires assistance for balance and to get standing.                             Pertinent Vitals/Pain Pain Assessment: Faces Faces Pain Scale: Hurts a little bit Pain Location: right side due to recent fall at SNF trying to get into wheelchair with staff Pain Descriptors / Indicators: Discomfort;Sore Pain Intervention(s): Monitored during session    Home Living Family/patient expects to be discharged to:: Skilled nursing facility                 Additional Comments: From [place], but reports he does not want to go back    Prior Function Level of Independence: Needs assistance   Gait / Transfers Assistance Needed: Reports he needs 1-2 person assist to transfer to [facility]. Is able to self propel WC  ADL's / Homemaking  Assistance Needed: Pt reports he requires assist with ADLs        Hand Dominance        Extremity/Trunk Assessment   Upper Extremity Assessment Upper Extremity Assessment: RUE deficits/detail RUE Deficits / Details: s/p previous CVAl wrist flexed, hand in fist, 0-100 elbowe flex/ext; shoulder flex to 80*    Lower Extremity Assessment Lower Extremity Assessment: RLE deficits/detail RLE Deficits / Details: flexor/extensor tone with mobility RLE Coordination: decreased gross motor    Cervical / Trunk Assessment Cervical / Trunk Assessment: Normal  Communication    Communication: No difficulties  Cognition Arousal/Alertness: Awake/alert Behavior During Therapy: WFL for tasks assessed/performed Overall Cognitive Status: Within Functional Limits for tasks assessed                                 General Comments: Patient pleasant able to carry conversation.      General Comments      Exercises     Assessment/Plan    PT Assessment Patient needs continued PT services  PT Problem List Decreased strength;Decreased mobility;Decreased range of motion;Decreased balance;Decreased activity tolerance;Impaired tone;Impaired sensation;Decreased coordination       PT Treatment Interventions DME instruction;Therapeutic exercise;Gait training;Balance training;Functional mobility training;Therapeutic activities;Patient/family education    PT Goals (Current goals can be found in the Care Plan section)  Acute Rehab PT Goals Patient Stated Goal: patient is ok going to rehab, but does not want to go back to [place] PT Goal Formulation: With patient Time For Goal Achievement: 04/29/21 Potential to Achieve Goals: Good    Frequency Min 2X/week   Barriers to discharge        Co-evaluation               AM-PAC PT "6 Clicks" Mobility  Outcome Measure Help needed turning from your back to your side while in a flat bed without using bedrails?: A Little Help needed moving from lying on your back to sitting on the side of a flat bed without using bedrails?: A Little Help needed moving to and from a bed to a chair (including a wheelchair)?: A Lot Help needed standing up from a chair using your arms (e.g., wheelchair or bedside chair)?: A Lot Help needed to walk in hospital room?: Total Help needed climbing 3-5 steps with a railing? : Total 6 Click Score: 12    End of Session Equipment Utilized During Treatment: Gait belt Activity Tolerance: Patient tolerated treatment well Patient left: in bed;with call bell/phone within  reach Nurse Communication: Mobility status PT Visit Diagnosis: Unsteadiness on feet (R26.81);Other abnormalities of gait and mobility (R26.89);Muscle weakness (generalized) (M62.81);Difficulty in walking, not elsewhere classified (R26.2);Hemiplegia and hemiparesis;History of falling (Z91.81) Hemiplegia - Right/Left: Right Hemiplegia - caused by: Cerebral infarction    Time: 1035-1106 PT Time Calculation (min) (ACUTE ONLY): 31 min   Charges:   PT Evaluation $PT Eval Moderate Complexity: 1 Mod PT Treatments $Therapeutic Activity: 8-22 mins        [employer], PT, [facility] 04/15/21,11:22 AM

## 2021-04-15 NOTE — Progress Notes (Signed)
[facility] Progress Note  Subjective: seen in room, feels much better. 4 L w/ HD yest.   Vitals:   04/15/21 0439 04/15/21 0719 04/15/21 1111 04/15/21 1152  BP: 126/64 (!) 143/73  (!) 145/75  Pulse: 89 85  82  Resp:  18  18  Temp: 98.8 F (37.1 C) 98.4 F (36.9 C)  98.2 F (36.8 C)  TempSrc: Oral Axillary  Oral  SpO2:      Weight:   70.7 kg   Height:        Exam:  alert, nad   no jvd  Chest cta bilat  Cor reg no RG  Abd soft ntnd no ascites   Ext no LE edema   Alert, NF,, pleaseant   LUA aVF+bruit       OP HD: [place] [facility]   4h  400/800   67.5kg  2/2 bath  AVF  Hep 1200   - no other meds       Assessment/ Plan: SOB - CXR borderline for convincing edema, not in distress, but did miss 2 Hd session and is up 7kg by wts. Had HD last night and looks better, now is 3kg over dry. Next HD tomorrow.  ESRD - on HD TTS. Missed last 2 HD sessions as outpt. HD tomorrow.  DM2 Anemia ckd - Hb 10- 12 MBD ckd - Ca wnl, cont binders Hx of CVA    Rob Mikko Lewellen 04/15/2021, 3:28 PM   Recent Labs  Lab 04/14/21 0400 04/14/21 0456  K 5.1 5.2*  BUN 76* 79*  CREATININE 14.03* 14.40*  CALCIUM 8.6*  --   HGB 10.9* 11.9*   Inpatient medications:  bictegravir-emtricitabine-tenofovir AF  1 tablet Oral Daily   carvedilol  12.5 mg Oral BID WC   Chlorhexidine Gluconate Cloth  6 each Topical Q0600   cloNIDine  0.1 mg Oral TID   clopidogrel  75 mg Oral Daily   heparin  5,000 Units Subcutaneous Q8H   insulin aspart  0-6 Units Subcutaneous TID WC   insulin glargine  2 Units Subcutaneous QHS   levETIRAcetam  500 mg Oral BID   lidocaine  1 patch Transdermal Q24H   pravastatin  80 mg Oral QHS   sodium chloride flush  3 mL Intravenous Q12H   tamsulosin  0.4 mg Oral Daily    acetaminophen **OR** acetaminophen, albuterol, alum & mag hydroxide-simeth, hydrALAZINE, triamcinolone cream

## 2021-04-15 NOTE — NC FL2 (Signed)
[place] LEVEL OF CARE SCREENING TOOL     IDENTIFICATION  Patient Name: Richard [name] Birthdate: [birth year] Sex: male Admission Date (Current Location): 04/14/2021  [facility] and [place] Number:  Herbalist and Address:  [facility], [place] [address] [place], [place] [address]      Provider Number: [id number]  Attending Physician Name and Address:  Darliss Cheney, MD  Relative Name and Phone Number:  [name] [phone]    Current Level of Care: Hospital Recommended Level of Care: [place] Prior Approval Number:    Date Approved/Denied:   PASRR Number: [id number]  Discharge Plan: SNF    Current Diagnoses: Patient Active Problem List   Diagnosis Date Noted   Acute on chronic respiratory failure with hypoxia ([place]) 04/14/2021   Volume overload 04/14/2021   Elevated troponin 02/18/2021   Acute on chronic diastolic CHF (congestive heart failure) ([place]) 02/18/2021   Depression 02/18/2021   Hemorrhoids 01/11/2021   Hyperkalemia 01/10/2021   Dialysis patient, noncompliant ([place])    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   Syncope 06/01/2020   Secondary hyperparathyroidism of renal origin ([place]) 05/28/2020   COVID-19 05/16/2020   Hypokalemia 05/16/2020   Allergy, unspecified, initial encounter 05/08/2020   Anaphylactic shock, unspecified, initial encounter 05/08/2020   Anemia in chronic kidney disease [id number]   Complication of vascular dialysis catheter 05/08/2020   Dependence on renal dialysis ([place]) 05/08/2020   Headache, unspecified 05/08/2020   Iron deficiency anemia, unspecified 05/08/2020   Cerebellar stroke syndrome 05/07/2020   Encounter for feeding tube placement    Weakness    Creatinine elevation    Goals of care, counseling/discussion    Hypertensive urgency    Meningoencephalitis    Palliative care by specialist    ESRD (end stage renal disease) ([place])    AIDS  (acquired immune deficiency syndrome) ([place]) 04/13/2020   Encephalopathy 04/13/2020   Seizure ([place]) 04/10/2020   History of cerebrovascular accident (CVA) with residual deficit 03/19/2020   CKD (chronic kidney disease) stage 5, GFR less than 15 ml/min ([place]) 02/15/2020   Altered mental status 02/13/2020   Neuropathy in diabetes ([place])    Migraines    Hypertension    GERD (gastroesophageal reflux disease)    DM2 (diabetes mellitus, type 2) ([place])    Arthritis    Hyperlipidemia 08/25/2012   Chronic kidney disease, stage 4 (severe) ([place]) 08/23/2012   Tobacco abuse 08/23/2012   Malnutrition of moderate degree ([place]) 08/23/2012   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 05/21/2012    Orientation RESPIRATION BLADDER Height & Weight     Self, Time, Situation, Place  Normal Continent Weight: 155 lb 12.8 oz (70.7 kg) Height:  6' (182.9 cm)  BEHAVIORAL SYMPTOMS/MOOD NEUROLOGICAL BOWEL NUTRITION STATUS      Continent (WDL) Diet (Please see discharge summary)  AMBULATORY STATUS COMMUNICATION OF NEEDS Skin   Limited Assist Verbally Other (Comment) (WDL,dry,Ecchymossis arm,right)                       Personal Care Assistance Level of Assistance  Bathing, Feeding, Dressing Bathing Assistance: Limited assistance Feeding assistance: Independent Dressing Assistance: Limited assistance     Functional Limitations Info    Sight Info: Adequate Hearing Info: Adequate Speech Info: Adequate    SPECIAL CARE FACTORS FREQUENCY  PT (By licensed PT), OT (By licensed OT)     PT Frequency: 5x min weekly OT Frequency: 5x min weekly  Contractures Contractures Info: Not present    Additional Factors Info  Code Status, Allergies, Insulin Sliding Scale Code Status Info: FULL Allergies Info: Oxycodone   Insulin Sliding Scale Info: insulin aspart (novoLOG) injection 0-6 Units 3 times daily with meals,insulin glargine (LANTUS) injection 2 Units daily at bedtime        Current Medications (04/15/2021):  This is the current hospital active medication list Current Facility-Administered Medications  Medication Dose Route Frequency Provider Last Rate Last Admin   acetaminophen (TYLENOL) tablet 650 mg  650 mg Oral Q6H PRN Norval Morton, MD       Or   acetaminophen (TYLENOL) suppository 650 mg  650 mg Rectal Q6H PRN Fuller Plan A, MD       albuterol (PROVENTIL) (2.5 MG/3ML) 0.083% nebulizer solution 2.5 mg  2.5 mg Nebulization Q4H PRN Norval Morton, MD       alum & mag hydroxide-simeth (MAALOX/MYLANTA) 200-200-20 MG/5ML suspension 30 mL  30 mL Oral Q4H PRN Fuller Plan A, MD   30 mL at 04/15/21 0129   bictegravir-emtricitabine-tenofovir AF (BIKTARVY) 50-200-25 MG per tablet 1 tablet  1 tablet Oral Daily Fuller Plan A, MD   1 tablet at 04/14/21 1730   carvedilol (COREG) tablet 12.5 mg  12.5 mg Oral BID WC Smith, Rondell A, MD   12.5 mg at 04/15/21 1026   Chlorhexidine Gluconate Cloth 2 % PADS 6 each  6 each Topical Q0600 Lynnda Child, PA-C   6 each at 04/14/21 1515   cloNIDine (CATAPRES) tablet 0.1 mg  0.1 mg Oral TID Fuller Plan A, MD   0.1 mg at 04/15/21 1026   clopidogrel (PLAVIX) tablet 75 mg  75 mg Oral Daily Smith, Rondell A, MD   75 mg at 04/15/21 1026   heparin injection 5,000 Units  5,000 Units Subcutaneous Q8H Smith, Rondell A, MD   5,000 Units at 04/15/21 [id number]   hydrALAZINE (APRESOLINE) injection 10-20 mg  10-20 mg Intravenous Q4H PRN Fuller Plan A, MD       insulin aspart (novoLOG) injection 0-6 Units  0-6 Units Subcutaneous TID WC Smith, Rondell A, MD       insulin glargine (LANTUS) injection 2 Units  2 Units Subcutaneous QHS Smith, Rondell A, MD       levETIRAcetam (KEPPRA) tablet 500 mg  500 mg Oral BID Smith, Rondell A, MD   500 mg at 04/15/21 1026   lidocaine (LIDODERM) 5 % 1 patch  1 patch Transdermal Q24H Smith, Rondell A, MD   1 patch at 04/15/21 1030   pravastatin (PRAVACHOL) tablet 80 mg  80 mg Oral QHS Smith,  Rondell A, MD   80 mg at 04/15/21 0130   sodium chloride flush (NS) 0.9 % injection 3 mL  3 mL Intravenous Q12H Smith, Rondell A, MD   3 mL at 04/15/21 1029   tamsulosin (FLOMAX) capsule 0.4 mg  0.4 mg Oral Daily Smith, Rondell A, MD   0.4 mg at 04/15/21 1026   triamcinolone cream (KENALOG) 0.1 % cream 1 application  1 application Topical BID PRN Norval Morton, MD         Discharge Medications: Please see discharge summary for a list of discharge medications.  Relevant Imaging Results:  Relevant Lab Results:   Additional Information [id number]  Trula Ore, LCSWA

## 2021-04-15 NOTE — TOC Initial Note (Addendum)
Transition of Care [facility]) - Initial/Assessment Note    Patient Details  Name: Richard [name] MRN: [id number] Date of Birth: 07-25-1956  [facility] CM/SW Contact:    Trula Ore, [place] Phone Number: 04/15/2021, 11:39 AM  Clinical Narrative:                   CSW spoke with patient at bedside who confirmed they are from [place]. Plan is for patient to return to [place] when medically ready. HD days are Tuesday,Thursday, and Saturday. Patient gave CSW permission to speak with daughter [name] regarding his care. Patient has received both covid vaccines. CSW spoke with Shirlee Limerick at [facility] who confirmed that patient can return when medically ready. Shirlee Limerick requested FL2 for patient.Shirlee Limerick confirmed patient will come back over through his medicaid for SNF and doe not need insurance authorization.CSW informed MD. CSW will continue to follow and assist with dc planning needs.  Expected Discharge Plan: Skilled Nursing Facility Barriers to Discharge: Continued Medical Work up   Patient Goals and CMS Choice Patient states their goals for this hospitalization and ongoing recovery are:: to go to SNF CMS Medicare.gov Compare Post Acute Care list provided to:: Patient Choice offered to / list presented to : Patient  Expected Discharge Plan and Services Expected Discharge Plan: [place] In-house Referral: Clinical Social Work     Living arrangements for the past 2 months: [place]                                      Prior Living Arrangements/Services Living arrangements for the past 2 months: [place] Lives with:: Self, Facility Resident Patient language and need for interpreter reviewed:: Yes Do you feel safe going back to the place where you live?: Yes (from [place])      Need for Family Participation in Patient Care: Yes (Comment) Care giver support system in place?: Yes  (comment)   Criminal Activity/Legal Involvement Pertinent to Current Situation/Hospitalization: No - Comment as needed  Activities of Daily Living      Permission Sought/Granted Permission sought to share information with : Case Manager, Family Supports, Customer service manager Permission granted to share information with : Yes, Verbal Permission Granted  Share Information with NAME: [name]  Permission granted to share info w AGENCY: SNF  Permission granted to share info w Relationship: daughter  Permission granted to share info w Contact Information: [name] [phone]  Emotional Assessment Appearance:: Appears stated age Attitude/Demeanor/Rapport: Gracious Affect (typically observed): Calm Orientation: : Oriented to Self, Oriented to Place, Oriented to  Time, Oriented to Situation Alcohol / Substance Use: Not Applicable Psych Involvement: No (comment)  Admission diagnosis:  Primary hypertension [I10] Acute pulmonary edema (HCC) [J81.0] Dyspnea [R06.00] ESRD (end stage renal disease) ([place]) [N18.6] Patient Active Problem List   Diagnosis Date Noted   Acute on chronic respiratory failure with hypoxia ([place]) 04/14/2021   Volume overload 04/14/2021   Elevated troponin 02/18/2021   Acute on chronic diastolic CHF (congestive heart failure) ([place]) 02/18/2021   Depression 02/18/2021   Hemorrhoids 01/11/2021   Hyperkalemia 01/10/2021   Dialysis patient, noncompliant ([place])    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   Syncope 06/01/2020   Secondary hyperparathyroidism of renal origin ([place]) 05/28/2020   COVID-19 05/16/2020   Hypokalemia 05/16/2020   Allergy, unspecified, initial encounter 05/08/2020   Anaphylactic shock,  unspecified, initial encounter 05/08/2020   Anemia in chronic kidney disease [id number]   Complication of vascular dialysis catheter 05/08/2020   Dependence on renal dialysis ([place]) 05/08/2020   Headache, unspecified 05/08/2020    Iron deficiency anemia, unspecified 05/08/2020   Cerebellar stroke syndrome 05/07/2020   Encounter for feeding tube placement    Weakness    Creatinine elevation    Goals of care, counseling/discussion    Hypertensive urgency    Meningoencephalitis    Palliative care by specialist    ESRD (end stage renal disease) ([place])    AIDS (acquired immune deficiency syndrome) ([place]) 04/13/2020   Encephalopathy 04/13/2020   Seizure ([place]) 04/10/2020   History of cerebrovascular accident (CVA) with residual deficit 03/19/2020   CKD (chronic kidney disease) stage 5, GFR less than 15 ml/min ([place]) 02/15/2020   Altered mental status 02/13/2020   Neuropathy in diabetes ([place])    Migraines    Hypertension    GERD (gastroesophageal reflux disease)    DM2 (diabetes mellitus, type 2) ([place])    Arthritis    Hyperlipidemia 08/25/2012   Chronic kidney disease, stage 4 (severe) ([place]) 08/23/2012   Tobacco abuse 08/23/2012   Malnutrition of moderate degree ([place]) 08/23/2012   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

## 2021-04-15 NOTE — Care Management (Signed)
[phone] 04-15-21 [facility] Notification # called and the referral # is [id number].

## 2021-04-15 NOTE — Progress Notes (Signed)
Attempted report x 2 

## 2021-04-15 NOTE — Discharge Summary (Signed)
Physician Discharge Summary  Richard [name] GXQ:[id number] DOB: June 03, 1956 DOA: 04/14/2021  PCP: [name], MD  Admit date: 04/14/2021 Discharge date: 04/15/2021 30 Day Unplanned Readmission Risk Score    Flowsheet Row ED to Hosp-Admission (Current) from 04/14/2021 in [facility]  30 Day Unplanned Readmission Risk Score (%) 30.65 Filed at 04/15/2021 0801       This score is the patient's risk of an unplanned readmission within 30 days of being discharged (0 -100%). The score is based on dignosis, age, lab data, medications, orders, and past utilization.   Low:  0-14.9   Medium: 15-21.9   High: 22-29.9   Extreme: 30 and above          Admitted From: Long-term care/SNF Disposition: Long-term care/SNF  Recommendations for Outpatient Follow-up:  Follow up with PCP in 1-2 weeks Please obtain BMP/CBC in one week Please follow up with your PCP on the following pending results: Unresulted Labs (From admission, onward)     Start     Ordered   04/15/21 0500  Renal function panel  Daily,   R      04/14/21 0733              Home Health: None Equipment/Devices: None  Discharge Condition: Stable CODE STATUS: Full code Diet recommendation: Low-sodium  Subjective: Seen and examined.  Feels much better.  No shortness of breath.   Following HPI and ED course is copied from my colleague hospitalist Dr. Sheran Spine H&P. HPI: Richard [name] is a 65 y.o. male with medical history significant of HTN, ESRD on HD, DM2, CVA, and HIV presents with complaints of shortness of breath this morning.  Overnight patient reported that he was unable to sleep due to feeling as though he could not breathe.  Denied having any significant chest pain, fever, nausea, vomiting, or diarrhea.  He had not had dialysis yesterday due to him losing his numbing cream.  Last dialysis session was 3 days ago.  He also notes that last week all staff was transferring him to his  wheelchair he was dropped.  Complains of having right sided rib pain and tailbone pain.  His right lower rib is tender to the touch.  He states that he has blisters on his buttocks where he was dropped, but nursing present in the room and states that he had been examined and had no blisters present.  He had asked staff members this morning to place him on oxygen for which EMS had been called.   In route with EMS patient O2 saturation was noted to be 83% room air and he was placed on 3 L nasal cannula.  He had last been admitted for acute hypoxic respiratory failure in the setting of fluid overload  5/29-   ED Course: Upon admission into the emergency department patient was seen to be afebrile, pulse 100-1 13, respiration 14-24, blood pressures elevated up to 231/120, and O2 saturations maintained.  Labs significant for hemoglobin 10.9, potassium 5.1, CO2 19, BUN 76, creatinine 14.03, calcium 8.6, anion gap 16, BNP 2190.5, and HS-troponin 78.  Influenza and COVID-19 screening were negative.  Chest x-ray noted mild bibasilar to mid left lung atelectasis and/or infiltrate for which early pulmonary edema could not be excluded.  Nephrology had been formally consulted.  While in the ED multiple attempts were performed to obtain IV access unsuccessfully.  Patient had been given 10 g of Lokelma, 1 inch of nitroglycerin paste,  hydralazine 25 mg po,  and Coreg 12.5 mg.  TRH called to admit and order placed for IV team  Brief/Interim Summary: Patient was admitted under hospital service due to acute respiratory failure with hypoxia secondary to volume overload due to missing hemodialysis.  Nephrology was consulted and patient received his hemodialysis.  Patient was soon weaned down to room air.  Feels much better today, no crackles on the lung exam.  Also presented with hypertensive urgency which was also likely secondary to acute pulmonary edema in the face of missing dialysis.  Now blood pressure controlled.  Metabolic  acidosis resolved after dialysis.  Slightly elevated troponin but flat, likely demand ischemia.  Patient cleared by nephrology.  He is being discharged back to his SNF.  He is advised to resume his dialysis per schedule which is TTS.  In length counseled about not missing his dialysis sessions.  Discharge Diagnoses:  Principal Problem:   Acute on chronic respiratory failure with hypoxia (HCC) Active Problems:   DM2 (diabetes mellitus, type 2) (HCC)   Seizure ([place])   AIDS (acquired immune deficiency syndrome) ([place])   ESRD (end stage renal disease) ([place])   Hypertensive urgency   Anemia in chronic kidney disease   Elevated troponin   Acute on chronic diastolic CHF (congestive heart failure) (HCC)   Volume overload    Discharge Instructions   Allergies as of 04/15/2021       Reactions   Oxycodone Nausea And Vomiting        Medication List     TAKE these medications    Accu-Chek Aviva Plus test strip Generic drug: glucose blood USE 1 strip TO test blood sugar 2 TIMES DAILY TO 3 TIMES DAILY   acetaminophen 325 MG tablet Commonly known as: TYLENOL Take 650 mg by mouth every 6 (six) hours as needed for moderate pain, fever or mild pain.   bictegravir-emtricitabine-tenofovir AF 50-200-25 MG Tabs tablet Commonly known as: BIKTARVY Take 1 tablet by mouth daily.   blood glucose meter kit and supplies Kit Dispense based on patient and insurance preference. Check blood sugar 4 times a day   carvedilol 12.5 MG tablet Commonly known as: COREG Take 12.5 mg by mouth in the morning and at bedtime.   cetirizine 10 MG tablet Commonly known as: ZYRTEC Take 10 mg by mouth daily.   cloNIDine 0.1 MG tablet Commonly known as: CATAPRES Take 1 tablet (0.1 mg total) by mouth 3 (three) times daily.   clopidogrel 75 MG tablet Commonly known as: PLAVIX Take 1 tablet (75 mg total) by mouth daily.   Commode 3-In-1 Misc Use as directed   Transfer Bench Misc Use as directed    Darbepoetin Alfa 60 MCG/0.3ML Sosy injection Commonly known as: ARANESP Inject 0.3 mLs (60 mcg total) into the vein every Saturday with hemodialysis.   doxepin 75 MG capsule Commonly known as: SINEQUAN Take 75 mg by mouth at bedtime.   EMOLLIENT EX Apply 1 application topically See admin instructions. Apply emollient lotion topically to arms and legs  one time daily for dry skin   feeding supplement (NEPRO CARB STEADY) Liqd Take 237 mLs by mouth 3 (three) times daily before meals.   insulin glargine 100 unit/mL Sopn Commonly known as: LANTUS Inject 5 Units into the skin at bedtime.   Insulin Pen Needle 31G X 8 MM Misc Commonly known as: Sure Comfort Pen Needles Use as directed twice daily with Novolog flex pen   levETIRAcetam 500 MG tablet Commonly known as: KEPPRA Take 500 mg by mouth  in the morning and at bedtime.   multivitamin with minerals Tabs tablet Take 1 tablet by mouth at bedtime.   ondansetron 4 MG tablet Commonly known as: ZOFRAN Take 4 mg by mouth every 4 (four) hours as needed for nausea/vomiting.   pantoprazole 40 MG tablet Commonly known as: PROTONIX TAKE 1 TABLET BY MOUTH EVERY DAY   pravastatin 80 MG tablet Commonly known as: PRAVACHOL Take 1 tablet (80 mg total) by mouth at bedtime.   Retacrit 20000 UNIT/ML injection Generic drug: epoetin alfa-epbx 20,000 Units every Monday. Intramuscularly (for ESRD)   sildenafil 100 MG tablet Commonly known as: VIAGRA Take 100 mg by mouth daily as needed for erectile dysfunction.   sulfamethoxazole-trimethoprim 800-160 MG tablet Commonly known as: BACTRIM DS Take 1 tablet by mouth 3 (three) times a week. What changed: when to take this   tamsulosin 0.4 MG Caps capsule Commonly known as: FLOMAX TAKE 1 CAPSULE BY MOUTH EVERY DAY   triamcinolone cream 0.1 % Commonly known as: KENALOG Apply 1 application topically 2 (two) times daily.   TRUEplus Insulin Syringe 31G X 5/16" 1 ML Misc Generic drug:  Insulin Syringe-Needle U-100 Use pen needle with insulin 2 times daily   Vitamin D 50 MCG (2000 UT) tablet Take 1 tablet (2,000 Units total) by mouth in the morning.        Follow-up Information     [name], MD .   Specialty: Family Medicine Contact information: [place] [address] [phone]         [name], MD Follow up in 1 week(s).   Specialty: Family Medicine Contact information: [place] [address] [phone]                Allergies  Allergen Reactions   Oxycodone Nausea And Vomiting    Consultations: Nephrology   Procedures/Studies: DG Ribs Unilateral Right  Result Date: 04/14/2021 CLINICAL DATA:  Sudden onset shortness of breath. EXAM: RIGHT RIBS - 2 VIEW COMPARISON:  Chest radiograph dated 04/14/2021. FINDINGS: No fracture or other bone lesions are seen involving the ribs. Mild bibasilar and left midlung atelectasis is partially imaged. IMPRESSION: No acute rib fracture identified. Electronically Signed   By: Zerita Boers M.D.   On: 04/14/2021 11:48   DG Lumbar Spine 2-3 Views  Result Date: 04/14/2021 CLINICAL DATA:  Sudden onset shortness of breath. History of CHF. Dialysis patient. Gluteal pain. Patient complains of tailbone pain. EXAM: LUMBAR SPINE - 2-3 VIEW COMPARISON:  None. FINDINGS: No malalignment or fracture in the lumbar spine. Degenerative disc disease at multiple levels with anterior osteophytes. Evaluation of the sacrum on the AP view is limited due to overlapping bowel gas and bowel contents. The sacrum is well seen on the lateral view. No sacral fracture identified. The coccyx is not visualized in its entirety. IMPRESSION: 1. No fracture or malalignment in the lumbar spine. Degenerative disc disease. 2. Evaluation of the sacrum is limited on the AP view due to overlapping bowel gas. No sacral fracture identified within visualize limits. 3. The coccyx is not  visualized in its entirety. Electronically Signed   By: Dorise Bullion III M.D   On: 04/14/2021 11:51   DG Chest Portable 1 View  Result Date: 04/14/2021 CLINICAL DATA:  Shortness of breath. EXAM: PORTABLE CHEST 1 VIEW COMPARISON:  April 02, 2021 FINDINGS: Mild areas of atelectasis and/or infiltrate are seen within the bilateral lung bases and mid left lung. There is no evidence  of a pleural effusion or pneumothorax. The cardiac silhouette is borderline in size. The visualized skeletal structures are unremarkable. IMPRESSION: Mild bibasilar and mid left lung atelectasis and/or infiltrate. Sequelae associated with early pulmonary edema cannot be excluded. Electronically Signed   By: Virgina Norfolk M.D.   On: 04/14/2021 03:34   DG Chest Portable 1 View  Result Date: 04/02/2021 CLINICAL DATA:  Shortness of breath EXAM: PORTABLE CHEST 1 VIEW COMPARISON:  02/17/2021 FINDINGS: Heart is borderline in size. Lungs clear. No effusions or edema. No acute bony abnormality. IMPRESSION: No active disease. Electronically Signed   By: Rolm Baptise M.D.   On: 04/02/2021 07:13     Discharge Exam: Vitals:   04/15/21 0439 04/15/21 0719  BP: 126/64 (!) 143/73  Pulse: 89 85  Resp:  18  Temp: 98.8 F (37.1 C) 98.4 F (36.9 C)  SpO2:     Vitals:   04/15/21 0149 04/15/21 0249 04/15/21 0439 04/15/21 0719  BP: (!) 157/89 110/66 126/64 (!) 143/73  Pulse:   89 85  Resp:    18  Temp:   98.8 F (37.1 C) 98.4 F (36.9 C)  TempSrc:   Oral Axillary  SpO2:      Weight:      Height:        General: Pt is alert, awake, not in acute distress Cardiovascular: RRR, S1/S2 +, no rubs, no gallops Respiratory: CTA bilaterally, no wheezing, no rhonchi Abdominal: Soft, NT, ND, bowel sounds + Extremities: no edema, no cyanosis    The results of significant diagnostics from this hospitalization (including imaging, microbiology, ancillary and laboratory) are listed below for reference.     Microbiology: Recent  Results (from the past 240 hour(s))  Resp Panel by RT-PCR (Flu A&B, Covid) Nasopharyngeal Swab     Status: None   Collection Time: 04/14/21  3:05 AM   Specimen: Nasopharyngeal Swab; Nasopharyngeal(NP) swabs in vial transport medium  Result Value Ref Range Status   SARS Coronavirus 2 by RT PCR NEGATIVE NEGATIVE Final    Comment: (NOTE) SARS-CoV-2 target nucleic acids are NOT DETECTED.  The SARS-CoV-2 RNA is generally detectable in upper respiratory specimens during the acute phase of infection. The lowest concentration of SARS-CoV-2 viral copies this assay can detect is 138 copies/mL. A negative result does not preclude SARS-Cov-2 infection and should not be used as the sole basis for treatment or other patient management decisions. A negative result may occur with  improper specimen collection/handling, submission of specimen other than nasopharyngeal swab, presence of viral mutation(s) within the areas targeted by this assay, and inadequate number of viral copies(<138 copies/mL). A negative result must be combined with clinical observations, patient history, and epidemiological information. The expected result is Negative.  Fact Sheet for Patients:  EntrepreneurPulse.com.au  Fact Sheet for Healthcare Providers:  IncredibleEmployment.be  This test is no t yet approved or cleared by the [place] FDA and  has been authorized for detection and/or diagnosis of SARS-CoV-2 by FDA under an Emergency Use Authorization (EUA). This EUA will remain  in effect (meaning this test can be used) for the duration of the COVID-19 declaration under Section 564(b)(1) of the Act, 21 U.S.C.section 360bbb-3(b)(1), unless the authorization is terminated  or revoked sooner.       Influenza A by PCR NEGATIVE NEGATIVE Final   Influenza B by PCR NEGATIVE NEGATIVE Final    Comment: (NOTE) The Xpert Xpress SARS-CoV-2/FLU/RSV plus assay is intended as an aid in the  diagnosis of influenza from Nasopharyngeal  swab specimens and should not be used as a sole basis for treatment. Nasal washings and aspirates are unacceptable for Xpert Xpress SARS-CoV-2/FLU/RSV testing.  Fact Sheet for Patients: EntrepreneurPulse.com.au  Fact Sheet for Healthcare Providers: IncredibleEmployment.be  This test is not yet approved or cleared by the [place] FDA and has been authorized for detection and/or diagnosis of SARS-CoV-2 by FDA under an Emergency Use Authorization (EUA). This EUA will remain in effect (meaning this test can be used) for the duration of the COVID-19 declaration under Section 564(b)(1) of the Act, 21 U.S.C. section 360bbb-3(b)(1), unless the authorization is terminated or revoked.  Performed at [facility], [place] [address] [place], [place] [address]      Labs: BNP (last 3 results) Recent Labs    02/17/21 0523 04/14/21 0400  BNP 983.1* 3,976.7*   Basic Metabolic Panel: Recent Labs  Lab 04/14/21 0400 04/14/21 0456  NA 138 140  K 5.1 5.2*  CL 103 109  CO2 19*  --   GLUCOSE 127* 122*  BUN 76* 79*  CREATININE 14.03* 14.40*  CALCIUM 8.6*  --    Liver Function Tests: Recent Labs  Lab 04/14/21 0400  AST 18  ALT 14  ALKPHOS 93  BILITOT 0.6  PROT 7.3  ALBUMIN 3.4*   No results for input(s): LIPASE, AMYLASE in the last 168 hours. No results for input(s): AMMONIA in the last 168 hours. CBC: Recent Labs  Lab 04/14/21 0400 04/14/21 0456  WBC 9.7  --   NEUTROABS 7.7  --   HGB 10.9* 11.9*  HCT 34.5* 35.0*  MCV 98.9  --   PLT 228  --    Cardiac Enzymes: No results for input(s): CKTOTAL, CKMB, CKMBINDEX, TROPONINI in the last 168 hours. BNP: Invalid input(s): POCBNP CBG: Recent Labs  Lab 04/14/21 0945 04/14/21 1651 04/15/21 0114 04/15/21 0717  GLUCAP 83 92 75 86   D-Dimer No results for input(s): DDIMER in the last 72 hours. Hgb A1c No results for input(s): HGBA1C  in the last 72 hours. Lipid Profile No results for input(s): CHOL, HDL, LDLCALC, TRIG, CHOLHDL, LDLDIRECT in the last 72 hours. Thyroid function studies No results for input(s): TSH, T4TOTAL, T3FREE, THYROIDAB in the last 72 hours.  Invalid input(s): FREET3 Anemia work up No results for input(s): VITAMINB12, FOLATE, FERRITIN, TIBC, IRON, RETICCTPCT in the last 72 hours. Urinalysis    Component Value Date/Time   COLORURINE STRAW (A) 04/11/2020 0223   APPEARANCEUR CLEAR 04/11/2020 0223   LABSPEC 1.011 04/11/2020 0223   PHURINE 5.0 04/11/2020 0223   GLUCOSEU 50 (A) 04/11/2020 0223   HGBUR SMALL (A) 04/11/2020 0223   BILIRUBINUR NEGATIVE 04/11/2020 0223   KETONESUR NEGATIVE 04/11/2020 0223   PROTEINUR >=300 (A) 04/11/2020 0223   UROBILINOGEN 0.2 11/29/2014 2003   NITRITE NEGATIVE 04/11/2020 0223   LEUKOCYTESUR NEGATIVE 04/11/2020 0223   Sepsis Labs Invalid input(s): PROCALCITONIN,  WBC,  LACTICIDVEN Microbiology Recent Results (from the past 240 hour(s))  Resp Panel by RT-PCR (Flu A&B, Covid) Nasopharyngeal Swab     Status: None   Collection Time: 04/14/21  3:05 AM   Specimen: Nasopharyngeal Swab; Nasopharyngeal(NP) swabs in vial transport medium  Result Value Ref Range Status   SARS Coronavirus 2 by RT PCR NEGATIVE NEGATIVE Final    Comment: (NOTE) SARS-CoV-2 target nucleic acids are NOT DETECTED.  The SARS-CoV-2 RNA is generally detectable in upper respiratory specimens during the acute phase of infection. The lowest concentration of SARS-CoV-2 viral copies this assay can detect is  138 copies/mL. A negative result does not preclude SARS-Cov-2 infection and should not be used as the sole basis for treatment or other patient management decisions. A negative result may occur with  improper specimen collection/handling, submission of specimen other than nasopharyngeal swab, presence of viral mutation(s) within the areas targeted by this assay, and inadequate number of  viral copies(<138 copies/mL). A negative result must be combined with clinical observations, patient history, and epidemiological information. The expected result is Negative.  Fact Sheet for Patients:  EntrepreneurPulse.com.au  Fact Sheet for Healthcare Providers:  IncredibleEmployment.be  This test is no t yet approved or cleared by the [place] FDA and  has been authorized for detection and/or diagnosis of SARS-CoV-2 by FDA under an Emergency Use Authorization (EUA). This EUA will remain  in effect (meaning this test can be used) for the duration of the COVID-19 declaration under Section 564(b)(1) of the Act, 21 U.S.C.section 360bbb-3(b)(1), unless the authorization is terminated  or revoked sooner.       Influenza A by PCR NEGATIVE NEGATIVE Final   Influenza B by PCR NEGATIVE NEGATIVE Final    Comment: (NOTE) The Xpert Xpress SARS-CoV-2/FLU/RSV plus assay is intended as an aid in the diagnosis of influenza from Nasopharyngeal swab specimens and should not be used as a sole basis for treatment. Nasal washings and aspirates are unacceptable for Xpert Xpress SARS-CoV-2/FLU/RSV testing.  Fact Sheet for Patients: EntrepreneurPulse.com.au  Fact Sheet for Healthcare Providers: IncredibleEmployment.be  This test is not yet approved or cleared by the [place] FDA and has been authorized for detection and/or diagnosis of SARS-CoV-2 by FDA under an Emergency Use Authorization (EUA). This EUA will remain in effect (meaning this test can be used) for the duration of the COVID-19 declaration under Section 564(b)(1) of the Act, 21 U.S.C. section 360bbb-3(b)(1), unless the authorization is terminated or revoked.  Performed at [facility], [place] [address], [place], [place] [address]      Time coordinating discharge: Over 30 minutes  SIGNED:   Darliss Cheney, MD  [facility] 04/15/2021, 11:47 AM  If 7PM-7AM, please contact night-coverage [email or web address]

## 2021-04-15 NOTE — Plan of Care (Signed)
  Problem: Clinical Measurements: Goal: Complications related to the disease process, condition or treatment will be avoided or minimized Outcome: Progressing   

## 2021-04-16 DIAGNOSIS — E1129 Type 2 diabetes mellitus with other diabetic kidney complication: Secondary | ICD-10-CM | POA: Diagnosis not present

## 2021-04-16 DIAGNOSIS — N186 End stage renal disease: Secondary | ICD-10-CM | POA: Diagnosis not present

## 2021-04-16 DIAGNOSIS — I120 Hypertensive chronic kidney disease with stage 5 chronic kidney disease or end stage renal disease: Secondary | ICD-10-CM | POA: Diagnosis not present

## 2021-04-17 ENCOUNTER — Encounter (HOSPITAL_COMMUNITY): Payer: Self-pay

## 2021-04-17 DIAGNOSIS — R569 Unspecified convulsions: Secondary | ICD-10-CM | POA: Diagnosis not present

## 2021-04-17 DIAGNOSIS — E119 Type 2 diabetes mellitus without complications: Secondary | ICD-10-CM | POA: Diagnosis not present

## 2021-04-17 DIAGNOSIS — K219 Gastro-esophageal reflux disease without esophagitis: Secondary | ICD-10-CM | POA: Diagnosis not present

## 2021-04-17 DIAGNOSIS — N186 End stage renal disease: Secondary | ICD-10-CM | POA: Diagnosis not present

## 2021-04-17 DIAGNOSIS — E785 Hyperlipidemia, unspecified: Secondary | ICD-10-CM | POA: Diagnosis not present

## 2021-04-17 DIAGNOSIS — N529 Male erectile dysfunction, unspecified: Secondary | ICD-10-CM | POA: Diagnosis not present

## 2021-04-17 DIAGNOSIS — I10 Essential (primary) hypertension: Secondary | ICD-10-CM | POA: Diagnosis not present

## 2021-04-18 DIAGNOSIS — U071 COVID-19: Secondary | ICD-10-CM | POA: Diagnosis not present

## 2021-04-18 DIAGNOSIS — B20 Human immunodeficiency virus [HIV] disease: Secondary | ICD-10-CM | POA: Diagnosis not present

## 2021-04-18 DIAGNOSIS — R2689 Other abnormalities of gait and mobility: Secondary | ICD-10-CM | POA: Diagnosis not present

## 2021-04-18 DIAGNOSIS — K219 Gastro-esophageal reflux disease without esophagitis: Secondary | ICD-10-CM | POA: Diagnosis not present

## 2021-04-18 DIAGNOSIS — N2581 Secondary hyperparathyroidism of renal origin: Secondary | ICD-10-CM | POA: Diagnosis not present

## 2021-04-18 DIAGNOSIS — I5033 Acute on chronic diastolic (congestive) heart failure: Secondary | ICD-10-CM | POA: Diagnosis not present

## 2021-04-18 DIAGNOSIS — E785 Hyperlipidemia, unspecified: Secondary | ICD-10-CM | POA: Diagnosis not present

## 2021-04-18 DIAGNOSIS — N186 End stage renal disease: Secondary | ICD-10-CM | POA: Diagnosis not present

## 2021-04-18 DIAGNOSIS — F445 Conversion disorder with seizures or convulsions: Secondary | ICD-10-CM | POA: Diagnosis not present

## 2021-04-18 DIAGNOSIS — I10 Essential (primary) hypertension: Secondary | ICD-10-CM | POA: Diagnosis not present

## 2021-04-18 DIAGNOSIS — S72301D Unspecified fracture of shaft of right femur, subsequent encounter for closed fracture with routine healing: Secondary | ICD-10-CM | POA: Diagnosis not present

## 2021-04-18 DIAGNOSIS — N529 Male erectile dysfunction, unspecified: Secondary | ICD-10-CM | POA: Diagnosis not present

## 2021-04-18 DIAGNOSIS — E119 Type 2 diabetes mellitus without complications: Secondary | ICD-10-CM | POA: Diagnosis not present

## 2021-04-18 DIAGNOSIS — E875 Hyperkalemia: Secondary | ICD-10-CM | POA: Diagnosis not present

## 2021-04-18 DIAGNOSIS — Z992 Dependence on renal dialysis: Secondary | ICD-10-CM | POA: Diagnosis not present

## 2021-04-18 DIAGNOSIS — M6281 Muscle weakness (generalized): Secondary | ICD-10-CM | POA: Diagnosis not present

## 2021-04-18 DIAGNOSIS — J9621 Acute and chronic respiratory failure with hypoxia: Secondary | ICD-10-CM | POA: Diagnosis not present

## 2021-04-19 DIAGNOSIS — R2689 Other abnormalities of gait and mobility: Secondary | ICD-10-CM | POA: Diagnosis not present

## 2021-04-19 DIAGNOSIS — U071 COVID-19: Secondary | ICD-10-CM | POA: Diagnosis not present

## 2021-04-19 DIAGNOSIS — M6281 Muscle weakness (generalized): Secondary | ICD-10-CM | POA: Diagnosis not present

## 2021-04-19 DIAGNOSIS — E875 Hyperkalemia: Secondary | ICD-10-CM | POA: Diagnosis not present

## 2021-04-19 DIAGNOSIS — J9621 Acute and chronic respiratory failure with hypoxia: Secondary | ICD-10-CM | POA: Diagnosis not present

## 2021-04-19 DIAGNOSIS — S72301D Unspecified fracture of shaft of right femur, subsequent encounter for closed fracture with routine healing: Secondary | ICD-10-CM | POA: Diagnosis not present

## 2021-04-19 DIAGNOSIS — I5033 Acute on chronic diastolic (congestive) heart failure: Secondary | ICD-10-CM | POA: Diagnosis not present

## 2021-04-19 DIAGNOSIS — F445 Conversion disorder with seizures or convulsions: Secondary | ICD-10-CM | POA: Diagnosis not present

## 2021-04-20 DIAGNOSIS — Z992 Dependence on renal dialysis: Secondary | ICD-10-CM | POA: Diagnosis not present

## 2021-04-20 DIAGNOSIS — N2581 Secondary hyperparathyroidism of renal origin: Secondary | ICD-10-CM | POA: Diagnosis not present

## 2021-04-20 DIAGNOSIS — N186 End stage renal disease: Secondary | ICD-10-CM | POA: Diagnosis not present

## 2021-04-22 DIAGNOSIS — I5033 Acute on chronic diastolic (congestive) heart failure: Secondary | ICD-10-CM | POA: Diagnosis not present

## 2021-04-22 DIAGNOSIS — R2689 Other abnormalities of gait and mobility: Secondary | ICD-10-CM | POA: Diagnosis not present

## 2021-04-22 DIAGNOSIS — R41841 Cognitive communication deficit: Secondary | ICD-10-CM | POA: Diagnosis not present

## 2021-04-22 DIAGNOSIS — U071 COVID-19: Secondary | ICD-10-CM | POA: Diagnosis not present

## 2021-04-22 DIAGNOSIS — M6282 Rhabdomyolysis: Secondary | ICD-10-CM | POA: Diagnosis not present

## 2021-04-22 DIAGNOSIS — S72301D Unspecified fracture of shaft of right femur, subsequent encounter for closed fracture with routine healing: Secondary | ICD-10-CM | POA: Diagnosis not present

## 2021-04-22 DIAGNOSIS — J9621 Acute and chronic respiratory failure with hypoxia: Secondary | ICD-10-CM | POA: Diagnosis not present

## 2021-04-22 DIAGNOSIS — M6281 Muscle weakness (generalized): Secondary | ICD-10-CM | POA: Diagnosis not present

## 2021-04-23 DIAGNOSIS — J9621 Acute and chronic respiratory failure with hypoxia: Secondary | ICD-10-CM | POA: Diagnosis not present

## 2021-04-23 DIAGNOSIS — R41841 Cognitive communication deficit: Secondary | ICD-10-CM | POA: Diagnosis not present

## 2021-04-23 DIAGNOSIS — M6281 Muscle weakness (generalized): Secondary | ICD-10-CM | POA: Diagnosis not present

## 2021-04-23 DIAGNOSIS — U071 COVID-19: Secondary | ICD-10-CM | POA: Diagnosis not present

## 2021-04-23 DIAGNOSIS — M6282 Rhabdomyolysis: Secondary | ICD-10-CM | POA: Diagnosis not present

## 2021-04-23 DIAGNOSIS — S72301D Unspecified fracture of shaft of right femur, subsequent encounter for closed fracture with routine healing: Secondary | ICD-10-CM | POA: Diagnosis not present

## 2021-04-23 DIAGNOSIS — I5033 Acute on chronic diastolic (congestive) heart failure: Secondary | ICD-10-CM | POA: Diagnosis not present

## 2021-04-23 DIAGNOSIS — R2689 Other abnormalities of gait and mobility: Secondary | ICD-10-CM | POA: Diagnosis not present

## 2021-04-24 DIAGNOSIS — U071 COVID-19: Secondary | ICD-10-CM | POA: Diagnosis not present

## 2021-04-24 DIAGNOSIS — S72301D Unspecified fracture of shaft of right femur, subsequent encounter for closed fracture with routine healing: Secondary | ICD-10-CM | POA: Diagnosis not present

## 2021-04-24 DIAGNOSIS — M6282 Rhabdomyolysis: Secondary | ICD-10-CM | POA: Diagnosis not present

## 2021-04-24 DIAGNOSIS — I5033 Acute on chronic diastolic (congestive) heart failure: Secondary | ICD-10-CM | POA: Diagnosis not present

## 2021-04-24 DIAGNOSIS — R2689 Other abnormalities of gait and mobility: Secondary | ICD-10-CM | POA: Diagnosis not present

## 2021-04-24 DIAGNOSIS — J9621 Acute and chronic respiratory failure with hypoxia: Secondary | ICD-10-CM | POA: Diagnosis not present

## 2021-04-24 DIAGNOSIS — R41841 Cognitive communication deficit: Secondary | ICD-10-CM | POA: Diagnosis not present

## 2021-04-24 DIAGNOSIS — M6281 Muscle weakness (generalized): Secondary | ICD-10-CM | POA: Diagnosis not present

## 2021-04-25 DIAGNOSIS — Z992 Dependence on renal dialysis: Secondary | ICD-10-CM | POA: Diagnosis not present

## 2021-04-25 DIAGNOSIS — N186 End stage renal disease: Secondary | ICD-10-CM | POA: Diagnosis not present

## 2021-04-25 DIAGNOSIS — N2581 Secondary hyperparathyroidism of renal origin: Secondary | ICD-10-CM | POA: Diagnosis not present

## 2021-04-26 DIAGNOSIS — L89301 Pressure ulcer of unspecified buttock, stage 1: Secondary | ICD-10-CM | POA: Diagnosis not present

## 2021-04-29 ENCOUNTER — Encounter: Payer: Medicare HMO | Admitting: Family Medicine

## 2021-04-29 ENCOUNTER — Encounter: Payer: Self-pay | Admitting: Family Medicine

## 2021-04-29 DIAGNOSIS — R2689 Other abnormalities of gait and mobility: Secondary | ICD-10-CM | POA: Diagnosis not present

## 2021-04-29 DIAGNOSIS — S72301D Unspecified fracture of shaft of right femur, subsequent encounter for closed fracture with routine healing: Secondary | ICD-10-CM | POA: Diagnosis not present

## 2021-04-29 DIAGNOSIS — M6281 Muscle weakness (generalized): Secondary | ICD-10-CM | POA: Diagnosis not present

## 2021-04-29 DIAGNOSIS — R41841 Cognitive communication deficit: Secondary | ICD-10-CM | POA: Diagnosis not present

## 2021-04-29 DIAGNOSIS — U071 COVID-19: Secondary | ICD-10-CM | POA: Diagnosis not present

## 2021-04-29 DIAGNOSIS — M6282 Rhabdomyolysis: Secondary | ICD-10-CM | POA: Diagnosis not present

## 2021-04-29 DIAGNOSIS — I5033 Acute on chronic diastolic (congestive) heart failure: Secondary | ICD-10-CM | POA: Diagnosis not present

## 2021-04-29 DIAGNOSIS — J9621 Acute and chronic respiratory failure with hypoxia: Secondary | ICD-10-CM | POA: Diagnosis not present

## 2021-04-29 NOTE — Progress Notes (Deleted)
Richard [name] DOB: [birth year] Encounter date: 04/29/2021  This is a 65 y.o. male who presents for a chronic condition visit.     No chief complaint on file.  No show 03/19/20, last visit with me was 11/2019. He has had multiple hospitalizations since that time and his chronic conditions have advanced. He is now residing in a long term care facility.   Most recent hospital admission was 04/14/21-04/15/21. Was admitted for shortness of breath which occurred after missing dialysis session. O2 sat was 83% on room air and was he was admitted for acute hypoxic resp failure in setting of fluid overload. He was also admitted for same reason on June 2, April 21th (admitted from home and discharged to [facility]).   Additionally he had admissions last fall for loss of consciousness.  ***  Diabetes Mellitus Type 2: Current symptoms/problems include {Symptoms; diabetes:30538}.  Medication compliance:  {Desc; compliance:5303::"compliant most of the time"} Diabetic diet compliance:  {Desc;compliance:5303::"compliant most of the time"},  Weight trend: {INCREASING/DECREASING/STABLE:21745} Current exercise: {EXERCISE OR:8922242 Barriers: {Barriers to success:22120}  Home blood sugarrecords: {diabetes glucometry results:16657::"fasting range ***"} Any episodes of hypoglycemia? {yes***/no:17258} Eye exam current (within one year): {yes/no/unknown:74}  reports that he has been smoking cigarettes. He has been smoking an average of .5 packs per day. He has never used smokeless tobacco.  Daily Aspirin? {yes OS:1138098 ACE/ARB? {yes OS:1138098 Statin? {yes OS:1138098  Hypertension:  Home blood pressure monitoring: {NO/[phone]}.  He {is/is not:9024} adherent to a low sodium diet. Patient {denies/complains:31533} {Symptoms of Hypertension,Denies:22115::"chest pain","shortness of breath","peripheral edema","palpitations"}.  Antihypertensive medication side effects: {Hypertension med side effects:5728::"no  medication side effects noted"}.  Use of agentsassociated with hypertension: {bp agents assoc with hypertension:511::"none"}.   Hyperlipidemia:  No new myalgias or GI upset on {RP HYPERLIPIDEMIA MEDS:19353}.    A1C:No results found for: LABA1C Micro:  Lab Results  Component Value Date   CREATININE 14.40 (H) 04/14/2021   CBC:  Lab Results  Component Value Date   WBC 9.7 04/14/2021   HGB 11.9 (L) 04/14/2021   HCT 35.0 (L) 04/14/2021   MCH 31.2 04/14/2021   MCHC 31.6 04/14/2021   RDW 14.6 04/14/2021   PLT 228 04/14/2021   CMP:  Lab Results  Component Value Date   NA 140 04/14/2021   K 5.2 (H) 04/14/2021   CL 109 04/14/2021   CO2 19 (L) 04/14/2021   ANIONGAP 16 (H) 04/14/2021   GLUCOSE 122 (H) 04/14/2021   BUN 79 (H) 04/14/2021   CREATININE 14.40 (H) 04/14/2021   GFRAA 7 (L) 06/02/2020   CALCIUM 8.6 (L) 04/14/2021   CALCIUM 7.7 (L) 01/14/2021   PROT 7.3 04/14/2021   BILITOT 0.6 04/14/2021   ALKPHOS 93 04/14/2021   ALT 14 04/14/2021   AST 18 04/14/2021   LIPID:  Lab Results  Component Value Date   CHOL 147 02/14/2020   TRIG 125 02/14/2020   HDL 31 (L) 02/14/2020   LDLCALC 91 02/14/2020        Allergies  Allergen Reactions   Oxycodone Nausea And Vomiting   No outpatient medications have been marked as taking for the 04/29/21 encounter (Appointment) with [name], MD.     Review of Systems   Objective:  There were no vitals taken for this visit.      BP Readings from Last 3 Encounters:  04/15/21 127/67  04/03/21 (!) 180/101  02/21/21 138/72   Wt Readings from Last 3 Encounters:  04/15/21 155 lb 12.8 oz (70.7 kg)  02/21/21 156 lb  8.4 oz (71 kg)  01/18/21 148 lb 5.9 oz (67.3 kg)    Physical Exam  Assessment/Plan: Health Maintenance Due  Topic Date Due   PNEUMOCOCCAL POLYSACCHARIDE VACCINE AGE 57-64 HIGH RISK  Never done   COVID-19 Vaccine (1) Never done   Pneumococcal Vaccine 69-86 Years old (1 - PCV) Never done   OPHTHALMOLOGY EXAM   Never done   Hepatitis C Screening  Never done   Zoster Vaccines- Shingrix (1 of 2) Never done   COLONOSCOPY (Pts 45-78yr Insurance coverage will need to be confirmed)  Never done   FOOT EXAM  01/02/2021   INFLUENZA VACCINE  04/22/2021   Health Maintenance reviewed. ***   Goals Addressed   None     There are no diagnoses linked to this encounter.  -continue current meds -reviewed labs -encouraged a healthy diet, regular exercise and maintaining a healthy weight -eye form given {YES / NO:19727}  No follow-ups on file.  ***order foot exam  JMicheline Rough MD

## 2021-04-30 DIAGNOSIS — R41841 Cognitive communication deficit: Secondary | ICD-10-CM | POA: Diagnosis not present

## 2021-04-30 DIAGNOSIS — Z992 Dependence on renal dialysis: Secondary | ICD-10-CM | POA: Diagnosis not present

## 2021-04-30 DIAGNOSIS — M6282 Rhabdomyolysis: Secondary | ICD-10-CM | POA: Diagnosis not present

## 2021-04-30 DIAGNOSIS — U071 COVID-19: Secondary | ICD-10-CM | POA: Diagnosis not present

## 2021-04-30 DIAGNOSIS — M6281 Muscle weakness (generalized): Secondary | ICD-10-CM | POA: Diagnosis not present

## 2021-04-30 DIAGNOSIS — N2581 Secondary hyperparathyroidism of renal origin: Secondary | ICD-10-CM | POA: Diagnosis not present

## 2021-04-30 DIAGNOSIS — J9621 Acute and chronic respiratory failure with hypoxia: Secondary | ICD-10-CM | POA: Diagnosis not present

## 2021-04-30 DIAGNOSIS — S72301D Unspecified fracture of shaft of right femur, subsequent encounter for closed fracture with routine healing: Secondary | ICD-10-CM | POA: Diagnosis not present

## 2021-04-30 DIAGNOSIS — I5033 Acute on chronic diastolic (congestive) heart failure: Secondary | ICD-10-CM | POA: Diagnosis not present

## 2021-04-30 DIAGNOSIS — N186 End stage renal disease: Secondary | ICD-10-CM | POA: Diagnosis not present

## 2021-04-30 DIAGNOSIS — R2689 Other abnormalities of gait and mobility: Secondary | ICD-10-CM | POA: Diagnosis not present

## 2021-05-01 DIAGNOSIS — R41841 Cognitive communication deficit: Secondary | ICD-10-CM | POA: Diagnosis not present

## 2021-05-01 DIAGNOSIS — R2689 Other abnormalities of gait and mobility: Secondary | ICD-10-CM | POA: Diagnosis not present

## 2021-05-01 DIAGNOSIS — M6282 Rhabdomyolysis: Secondary | ICD-10-CM | POA: Diagnosis not present

## 2021-05-01 DIAGNOSIS — J9621 Acute and chronic respiratory failure with hypoxia: Secondary | ICD-10-CM | POA: Diagnosis not present

## 2021-05-01 DIAGNOSIS — U071 COVID-19: Secondary | ICD-10-CM | POA: Diagnosis not present

## 2021-05-01 DIAGNOSIS — S72301D Unspecified fracture of shaft of right femur, subsequent encounter for closed fracture with routine healing: Secondary | ICD-10-CM | POA: Diagnosis not present

## 2021-05-01 DIAGNOSIS — L89301 Pressure ulcer of unspecified buttock, stage 1: Secondary | ICD-10-CM | POA: Diagnosis not present

## 2021-05-01 DIAGNOSIS — I5033 Acute on chronic diastolic (congestive) heart failure: Secondary | ICD-10-CM | POA: Diagnosis not present

## 2021-05-01 DIAGNOSIS — M6281 Muscle weakness (generalized): Secondary | ICD-10-CM | POA: Diagnosis not present

## 2021-05-02 DIAGNOSIS — M6282 Rhabdomyolysis: Secondary | ICD-10-CM | POA: Diagnosis not present

## 2021-05-02 DIAGNOSIS — I5033 Acute on chronic diastolic (congestive) heart failure: Secondary | ICD-10-CM | POA: Diagnosis not present

## 2021-05-02 DIAGNOSIS — R41841 Cognitive communication deficit: Secondary | ICD-10-CM | POA: Diagnosis not present

## 2021-05-02 DIAGNOSIS — U071 COVID-19: Secondary | ICD-10-CM | POA: Diagnosis not present

## 2021-05-02 DIAGNOSIS — R2689 Other abnormalities of gait and mobility: Secondary | ICD-10-CM | POA: Diagnosis not present

## 2021-05-02 DIAGNOSIS — J9621 Acute and chronic respiratory failure with hypoxia: Secondary | ICD-10-CM | POA: Diagnosis not present

## 2021-05-02 DIAGNOSIS — M6281 Muscle weakness (generalized): Secondary | ICD-10-CM | POA: Diagnosis not present

## 2021-05-02 DIAGNOSIS — S72301D Unspecified fracture of shaft of right femur, subsequent encounter for closed fracture with routine healing: Secondary | ICD-10-CM | POA: Diagnosis not present

## 2021-05-07 DIAGNOSIS — N186 End stage renal disease: Secondary | ICD-10-CM | POA: Diagnosis not present

## 2021-05-07 DIAGNOSIS — Z992 Dependence on renal dialysis: Secondary | ICD-10-CM | POA: Diagnosis not present

## 2021-05-07 DIAGNOSIS — N2581 Secondary hyperparathyroidism of renal origin: Secondary | ICD-10-CM | POA: Diagnosis not present

## 2021-05-08 DIAGNOSIS — M6281 Muscle weakness (generalized): Secondary | ICD-10-CM | POA: Diagnosis not present

## 2021-05-08 DIAGNOSIS — R41841 Cognitive communication deficit: Secondary | ICD-10-CM | POA: Diagnosis not present

## 2021-05-08 DIAGNOSIS — M6282 Rhabdomyolysis: Secondary | ICD-10-CM | POA: Diagnosis not present

## 2021-05-08 DIAGNOSIS — S72301D Unspecified fracture of shaft of right femur, subsequent encounter for closed fracture with routine healing: Secondary | ICD-10-CM | POA: Diagnosis not present

## 2021-05-08 DIAGNOSIS — I5033 Acute on chronic diastolic (congestive) heart failure: Secondary | ICD-10-CM | POA: Diagnosis not present

## 2021-05-08 DIAGNOSIS — J9621 Acute and chronic respiratory failure with hypoxia: Secondary | ICD-10-CM | POA: Diagnosis not present

## 2021-05-08 DIAGNOSIS — R2689 Other abnormalities of gait and mobility: Secondary | ICD-10-CM | POA: Diagnosis not present

## 2021-05-08 DIAGNOSIS — U071 COVID-19: Secondary | ICD-10-CM | POA: Diagnosis not present

## 2021-05-09 DIAGNOSIS — M6282 Rhabdomyolysis: Secondary | ICD-10-CM | POA: Diagnosis not present

## 2021-05-09 DIAGNOSIS — I5033 Acute on chronic diastolic (congestive) heart failure: Secondary | ICD-10-CM | POA: Diagnosis not present

## 2021-05-09 DIAGNOSIS — M6281 Muscle weakness (generalized): Secondary | ICD-10-CM | POA: Diagnosis not present

## 2021-05-09 DIAGNOSIS — R41841 Cognitive communication deficit: Secondary | ICD-10-CM | POA: Diagnosis not present

## 2021-05-09 DIAGNOSIS — R2689 Other abnormalities of gait and mobility: Secondary | ICD-10-CM | POA: Diagnosis not present

## 2021-05-09 DIAGNOSIS — J9621 Acute and chronic respiratory failure with hypoxia: Secondary | ICD-10-CM | POA: Diagnosis not present

## 2021-05-09 DIAGNOSIS — U071 COVID-19: Secondary | ICD-10-CM | POA: Diagnosis not present

## 2021-05-09 DIAGNOSIS — S72301D Unspecified fracture of shaft of right femur, subsequent encounter for closed fracture with routine healing: Secondary | ICD-10-CM | POA: Diagnosis not present

## 2021-05-10 DIAGNOSIS — N400 Enlarged prostate without lower urinary tract symptoms: Secondary | ICD-10-CM | POA: Diagnosis not present

## 2021-05-10 DIAGNOSIS — E785 Hyperlipidemia, unspecified: Secondary | ICD-10-CM | POA: Diagnosis not present

## 2021-05-10 DIAGNOSIS — E559 Vitamin D deficiency, unspecified: Secondary | ICD-10-CM | POA: Diagnosis not present

## 2021-05-10 DIAGNOSIS — N186 End stage renal disease: Secondary | ICD-10-CM | POA: Diagnosis not present

## 2021-05-10 DIAGNOSIS — I509 Heart failure, unspecified: Secondary | ICD-10-CM | POA: Diagnosis not present

## 2021-05-10 DIAGNOSIS — E119 Type 2 diabetes mellitus without complications: Secondary | ICD-10-CM | POA: Diagnosis not present

## 2021-05-10 DIAGNOSIS — I10 Essential (primary) hypertension: Secondary | ICD-10-CM | POA: Diagnosis not present

## 2021-05-13 ENCOUNTER — Encounter (HOSPITAL_COMMUNITY): Payer: Self-pay

## 2021-05-14 DIAGNOSIS — M6282 Rhabdomyolysis: Secondary | ICD-10-CM | POA: Diagnosis not present

## 2021-05-14 DIAGNOSIS — I5033 Acute on chronic diastolic (congestive) heart failure: Secondary | ICD-10-CM | POA: Diagnosis not present

## 2021-05-14 DIAGNOSIS — M6281 Muscle weakness (generalized): Secondary | ICD-10-CM | POA: Diagnosis not present

## 2021-05-14 DIAGNOSIS — J9621 Acute and chronic respiratory failure with hypoxia: Secondary | ICD-10-CM | POA: Diagnosis not present

## 2021-05-14 DIAGNOSIS — R41841 Cognitive communication deficit: Secondary | ICD-10-CM | POA: Diagnosis not present

## 2021-05-14 DIAGNOSIS — S72301D Unspecified fracture of shaft of right femur, subsequent encounter for closed fracture with routine healing: Secondary | ICD-10-CM | POA: Diagnosis not present

## 2021-05-14 DIAGNOSIS — R2689 Other abnormalities of gait and mobility: Secondary | ICD-10-CM | POA: Diagnosis not present

## 2021-05-14 DIAGNOSIS — U071 COVID-19: Secondary | ICD-10-CM | POA: Diagnosis not present

## 2021-05-15 DIAGNOSIS — R41841 Cognitive communication deficit: Secondary | ICD-10-CM | POA: Diagnosis not present

## 2021-05-15 DIAGNOSIS — E785 Hyperlipidemia, unspecified: Secondary | ICD-10-CM | POA: Diagnosis not present

## 2021-05-15 DIAGNOSIS — M6281 Muscle weakness (generalized): Secondary | ICD-10-CM | POA: Diagnosis not present

## 2021-05-15 DIAGNOSIS — I5033 Acute on chronic diastolic (congestive) heart failure: Secondary | ICD-10-CM | POA: Diagnosis not present

## 2021-05-15 DIAGNOSIS — R2689 Other abnormalities of gait and mobility: Secondary | ICD-10-CM | POA: Diagnosis not present

## 2021-05-15 DIAGNOSIS — B20 Human immunodeficiency virus [HIV] disease: Secondary | ICD-10-CM | POA: Diagnosis not present

## 2021-05-15 DIAGNOSIS — S72301D Unspecified fracture of shaft of right femur, subsequent encounter for closed fracture with routine healing: Secondary | ICD-10-CM | POA: Diagnosis not present

## 2021-05-15 DIAGNOSIS — M6282 Rhabdomyolysis: Secondary | ICD-10-CM | POA: Diagnosis not present

## 2021-05-15 DIAGNOSIS — I10 Essential (primary) hypertension: Secondary | ICD-10-CM | POA: Diagnosis not present

## 2021-05-15 DIAGNOSIS — N400 Enlarged prostate without lower urinary tract symptoms: Secondary | ICD-10-CM | POA: Diagnosis not present

## 2021-05-15 DIAGNOSIS — U071 COVID-19: Secondary | ICD-10-CM | POA: Diagnosis not present

## 2021-05-15 DIAGNOSIS — J9621 Acute and chronic respiratory failure with hypoxia: Secondary | ICD-10-CM | POA: Diagnosis not present

## 2021-05-15 DIAGNOSIS — N186 End stage renal disease: Secondary | ICD-10-CM | POA: Diagnosis not present

## 2021-05-15 DIAGNOSIS — E559 Vitamin D deficiency, unspecified: Secondary | ICD-10-CM | POA: Diagnosis not present

## 2021-05-15 DIAGNOSIS — E119 Type 2 diabetes mellitus without complications: Secondary | ICD-10-CM | POA: Diagnosis not present

## 2021-05-16 DIAGNOSIS — N186 End stage renal disease: Secondary | ICD-10-CM | POA: Diagnosis not present

## 2021-05-16 DIAGNOSIS — N2581 Secondary hyperparathyroidism of renal origin: Secondary | ICD-10-CM | POA: Diagnosis not present

## 2021-05-16 DIAGNOSIS — Z992 Dependence on renal dialysis: Secondary | ICD-10-CM | POA: Diagnosis not present

## 2021-05-22 DIAGNOSIS — N186 End stage renal disease: Secondary | ICD-10-CM | POA: Diagnosis not present

## 2021-05-22 DIAGNOSIS — E1129 Type 2 diabetes mellitus with other diabetic kidney complication: Secondary | ICD-10-CM | POA: Diagnosis not present

## 2021-05-22 DIAGNOSIS — E1122 Type 2 diabetes mellitus with diabetic chronic kidney disease: Secondary | ICD-10-CM | POA: Diagnosis not present

## 2021-05-30 ENCOUNTER — Telehealth: Payer: Self-pay | Admitting: Family Medicine

## 2021-05-30 NOTE — Telephone Encounter (Signed)
Left a detailed message at the number below to call the office to schedule an appointment.

## 2021-05-30 NOTE — Telephone Encounter (Signed)
Patient is asking if he needs an appointment for a medication refill.   Patient doesn't know his personal phone number but he could be contacted at [phone] [facility]).  Please advise.

## 2021-06-25 ENCOUNTER — Emergency Department (HOSPITAL_COMMUNITY)
Admission: EM | Admit: 2021-06-25 | Discharge: 2021-06-26 | Disposition: A | Discharge status: Home / Self Care | Payer: No Typology Code available for payment source | Attending: Emergency Medicine | Admitting: Emergency Medicine

## 2021-06-25 ENCOUNTER — Other Ambulatory Visit: Payer: Self-pay

## 2021-06-25 ENCOUNTER — Emergency Department (HOSPITAL_COMMUNITY): Payer: No Typology Code available for payment source

## 2021-06-25 ENCOUNTER — Encounter (HOSPITAL_COMMUNITY): Payer: Self-pay

## 2021-06-25 DIAGNOSIS — I5033 Acute on chronic diastolic (congestive) heart failure: Secondary | ICD-10-CM | POA: Diagnosis not present

## 2021-06-25 DIAGNOSIS — Z8616 Personal history of COVID-19: Secondary | ICD-10-CM | POA: Diagnosis not present

## 2021-06-25 DIAGNOSIS — Z79899 Other long term (current) drug therapy: Secondary | ICD-10-CM | POA: Insufficient documentation

## 2021-06-25 DIAGNOSIS — Z992 Dependence on renal dialysis: Secondary | ICD-10-CM | POA: Insufficient documentation

## 2021-06-25 DIAGNOSIS — Z7902 Long term (current) use of antithrombotics/antiplatelets: Secondary | ICD-10-CM | POA: Insufficient documentation

## 2021-06-25 DIAGNOSIS — R030 Elevated blood-pressure reading, without diagnosis of hypertension: Secondary | ICD-10-CM

## 2021-06-25 DIAGNOSIS — R456 Violent behavior: Secondary | ICD-10-CM | POA: Diagnosis not present

## 2021-06-25 DIAGNOSIS — Z046 Encounter for general psychiatric examination, requested by authority: Secondary | ICD-10-CM | POA: Diagnosis present

## 2021-06-25 DIAGNOSIS — F17210 Nicotine dependence, cigarettes, uncomplicated: Secondary | ICD-10-CM | POA: Diagnosis not present

## 2021-06-25 DIAGNOSIS — I132 Hypertensive heart and chronic kidney disease with heart failure and with stage 5 chronic kidney disease, or end stage renal disease: Secondary | ICD-10-CM | POA: Insufficient documentation

## 2021-06-25 DIAGNOSIS — Z794 Long term (current) use of insulin: Secondary | ICD-10-CM | POA: Diagnosis not present

## 2021-06-25 DIAGNOSIS — E1122 Type 2 diabetes mellitus with diabetic chronic kidney disease: Secondary | ICD-10-CM | POA: Diagnosis not present

## 2021-06-25 DIAGNOSIS — R4689 Other symptoms and signs involving appearance and behavior: Secondary | ICD-10-CM

## 2021-06-25 DIAGNOSIS — B20 Human immunodeficiency virus [HIV] disease: Secondary | ICD-10-CM | POA: Insufficient documentation

## 2021-06-25 DIAGNOSIS — N186 End stage renal disease: Secondary | ICD-10-CM | POA: Insufficient documentation

## 2021-06-25 MED ORDER — HYDRALAZINE HCL 25 MG PO TABS
25.0000 mg | ORAL_TABLET | Freq: Once | ORAL | Status: AC
  Administered 2021-06-26: 25 mg via ORAL
  Filled 2021-06-25: qty 1

## 2021-06-25 MED ORDER — LIDOCAINE HCL 1 % IJ SOLN
INTRAMUSCULAR | Status: AC
  Filled 2021-06-25: qty 20

## 2021-06-25 MED ORDER — CARVEDILOL 12.5 MG PO TABS
12.5000 mg | ORAL_TABLET | Freq: Once | ORAL | Status: AC
  Administered 2021-06-25: 12.5 mg via ORAL
  Filled 2021-06-25: qty 1

## 2021-06-25 MED ORDER — CLONIDINE HCL 0.1 MG PO TABS
0.1000 mg | ORAL_TABLET | Freq: Once | ORAL | Status: AC
  Administered 2021-06-25: 0.1 mg via ORAL
  Filled 2021-06-25: qty 1

## 2021-06-25 NOTE — ED Notes (Signed)
I asked pt about blood work and he expressed that he was a very difficult stick and required ultrasound to obtain labs. RN made aware

## 2021-06-25 NOTE — ED Notes (Signed)
Iv team unable to start IV, PT refuse writer to attempt for labs

## 2021-06-25 NOTE — ED Triage Notes (Signed)
Pt BIB EMS from [facility] under IVC. [facility] states that he has been combative towards staff and other patients. Pt has right side hemiparesis, contracture to right arm, nonambulatory.
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
## 2021-06-26 NOTE — Progress Notes (Addendum)
CSW informed [facility] of pt, d/c . [facility] called for transport. Call report to [phone].  Arlie Solomons.Cheron Coryell, MSW, [place]  Transitions of Care Clinical Social Worker I Direct Dial: [phone]  Fax: [phone] [email or web address]

## 2021-06-26 NOTE — ED Notes (Signed)
[facility] called to transport pt back to [facility].
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
## 2021-06-26 NOTE — Progress Notes (Signed)
CSW attempted to call [place] to inform them pt will be on his way back to facility no response , unable to leave mailbox  as mailbox is full. CSW continue to follow.   Arlie Solomons.Harlem Thresher, MSW, [place]  Transitions of Care Clinical Social Worker I Direct Dial: [phone]  Fax: [phone] [email or web address]

## 2021-06-26 NOTE — ED Notes (Signed)
Per Montine Circle PA, pt is no longer under IVC and is cleared for discharge.

## 2021-06-26 NOTE — ED Notes (Signed)
Spoke to staff at [facility]. [facility] RN stated that per administration, they will be refusing to take pt back to [facility]. MD notified.

## 2021-07-04 ENCOUNTER — Encounter (HOSPITAL_COMMUNITY): Payer: Self-pay

## 2021-07-10 ENCOUNTER — Ambulatory Visit (INDEPENDENT_AMBULATORY_CARE_PROVIDER_SITE_OTHER): Payer: Medicare HMO | Admitting: Family Medicine

## 2021-07-10 ENCOUNTER — Other Ambulatory Visit: Payer: Self-pay

## 2021-07-10 ENCOUNTER — Telehealth: Payer: Self-pay | Admitting: *Deleted

## 2021-07-10 ENCOUNTER — Encounter: Payer: Self-pay | Admitting: Family Medicine

## 2021-07-10 VITALS — BP 164/100 | HR 78 | Temp 98.0°F | Ht 72.0 in

## 2021-07-10 DIAGNOSIS — I10 Essential (primary) hypertension: Secondary | ICD-10-CM

## 2021-07-10 DIAGNOSIS — E785 Hyperlipidemia, unspecified: Secondary | ICD-10-CM

## 2021-07-10 DIAGNOSIS — Z720 Tobacco use: Secondary | ICD-10-CM

## 2021-07-10 DIAGNOSIS — I6930 Unspecified sequelae of cerebral infarction: Secondary | ICD-10-CM

## 2021-07-10 DIAGNOSIS — E1122 Type 2 diabetes mellitus with diabetic chronic kidney disease: Secondary | ICD-10-CM

## 2021-07-10 DIAGNOSIS — N186 End stage renal disease: Secondary | ICD-10-CM

## 2021-07-10 DIAGNOSIS — N2581 Secondary hyperparathyroidism of renal origin: Secondary | ICD-10-CM | POA: Diagnosis not present

## 2021-07-10 DIAGNOSIS — Z992 Dependence on renal dialysis: Secondary | ICD-10-CM

## 2021-07-10 DIAGNOSIS — E1141 Type 2 diabetes mellitus with diabetic mononeuropathy: Secondary | ICD-10-CM

## 2021-07-10 DIAGNOSIS — R531 Weakness: Secondary | ICD-10-CM

## 2021-07-10 DIAGNOSIS — B20 Human immunodeficiency virus [HIV] disease: Secondary | ICD-10-CM

## 2021-07-10 DIAGNOSIS — R5381 Other malaise: Secondary | ICD-10-CM

## 2021-07-10 DIAGNOSIS — Z794 Long term (current) use of insulin: Secondary | ICD-10-CM

## 2021-07-10 DIAGNOSIS — N185 Chronic kidney disease, stage 5: Secondary | ICD-10-CM

## 2021-07-10 DIAGNOSIS — F331 Major depressive disorder, recurrent, moderate: Secondary | ICD-10-CM

## 2021-07-10 DIAGNOSIS — N184 Chronic kidney disease, stage 4 (severe): Secondary | ICD-10-CM

## 2021-07-10 NOTE — Progress Notes (Signed)
Richard [name] DOB: September 09, 1956 Encounter date: 07/10/2021  This is a 65 y.o. male who presents for complete physical   History of present illness/Additional concerns: No show 04/29/21, 03/19/20, 02/27/20. Last visit with me was 01/19/19.    He is at [facility]. Doesn't want to be there. He didn't have place to go after a hospitalization because he roommate left the place he was staying. [facility] was spot that was picked for him.   Just wants to be in a decent area. He does usually eat out. He wants to get stronger so that he can drive. Stays with right sided weakness.   He is getting dialysis three times/week.   He has been in his wheelchair for a year. He states that they are trying to do therapy with him; but hasn't had any for 3 months at living facility - he isn't sure why. Left side little weak, but not like right side with prior stroke.   No headaches.   Appetite: appetite is good, but doesn't like food at [employer] and runs out of money to buy food he likes. Not doing any meal supplements. States that weight has been stable for last 6 months.   Breathing has been good. When hot outside and his room was very hot, then he had some breathing issues. Now he is in room with air on all the time which is better for him. Does still smoke - 1/2 PPD. Doesn't want to quit.   Mood is up and down. Doesn't feel like he has a good support system. He isn't happy where he is living. He would like to live on his own. Has some people he feels would help with meal prep if needed.   Still needs help getting up and out of bed, going to bathroom.   Would like electric wheelchair. Not been able to walk with walker or cane. Can't use right arm for wheelchair. Uses left arm and left foot for mobility.    Past Medical History:  Diagnosis Date   Altered mental status 02/13/2020   Arthritis    Chronic kidney disease    COVID-19 05/16/2020   Diabetes mellitus, type II, insulin dependent  ([place])    Encephalopathy 04/13/2020   GERD (gastroesophageal reflux disease)    Hypertension    Hypertensive emergency 05/21/2012   Hypertensive urgency    Immune deficiency disorder ([place])    Meningoencephalitis    Migraines    Neuropathy in diabetes ([place])    bilat feet   Ruptured lumbar disc    Stroke ([place]) 2013   residual right sided weakness (arm>leg)   Past Surgical History:  Procedure Laterality Date   AV FISTULA PLACEMENT Left 04/25/2020   Procedure: LEFT ARM BRACHIOCEPHALIC ARTERIOVENOUS (AV) FISTULA CREATION;  Surgeon: Rosetta Posner, MD;  Location: [facility] OR;  Service: Vascular;  Laterality: Left;   IR FLUORO GUIDE CV LINE RIGHT  04/13/2020   IR REMOVAL TUN CV CATH W/O FL  01/14/2021   IR US GUIDE VASC ACCESS RIGHT  04/13/2020   KNEE ARTHROSCOPY Bilateral    Allergies  Allergen Reactions   Oxycodone Nausea And Vomiting   Current Meds  Medication Sig   ACCU-CHEK AVIVA PLUS test strip USE 1 strip TO test blood sugar 2 TIMES DAILY TO 3 TIMES DAILY   acetaminophen (TYLENOL) 325 MG tablet Take 650 mg by mouth every 6 (six) hours as needed for moderate pain, fever or mild pain.   bictegravir-emtricitabine-tenofovir AF (BIKTARVY) 50-200-25 MG  TABS tablet Take 1 tablet by mouth daily.   blood glucose meter kit and supplies KIT Dispense based on patient and insurance preference. Check blood sugar 4 times a day   carvedilol (COREG) 12.5 MG tablet Take 12.5 mg by mouth in the morning and at bedtime.   cetirizine (ZYRTEC) 10 MG tablet Take 10 mg by mouth daily.   Cholecalciferol (VITAMIN D) 50 MCG (2000 UT) tablet Take 1 tablet (2,000 Units total) by mouth in the morning.   cloNIDine (CATAPRES) 0.1 MG tablet Take 1 tablet (0.1 mg total) by mouth 3 (three) times daily.   clopidogrel (PLAVIX) 75 MG tablet Take 1 tablet (75 mg total) by mouth daily.   Darbepoetin Alfa (ARANESP) 60 MCG/0.3ML SOSY injection Inject 0.3 mLs (60 mcg total) into the vein every Saturday with hemodialysis.   doxepin  (SINEQUAN) 75 MG capsule Take 75 mg by mouth at bedtime.   EMOLLIENT EX Apply 1 application topically See admin instructions. Apply emollient lotion topically to arms and legs  one time daily for dry skin   epoetin alfa-epbx (RETACRIT) 27062 UNIT/ML injection 20,000 Units every Monday. Intramuscularly (for ESRD)   insulin glargine (LANTUS) 100 unit/mL SOPN Inject 5 Units into the skin at bedtime.   Insulin Pen Needle (SURE COMFORT PEN NEEDLES) 31G X 8 MM MISC Use as directed twice daily with Novolog flex pen   levETIRAcetam (KEPPRA) 500 MG tablet Take 500 mg by mouth in the morning and at bedtime.   Misc. Devices (COMMODE 3-IN-1) MISC Use as directed   Misc. Devices (TRANSFER BENCH) MISC Use as directed   Multiple Vitamin (MULTIVITAMIN WITH MINERALS) TABS tablet Take 1 tablet by mouth at bedtime.   Nutritional Supplements (FEEDING SUPPLEMENT, NEPRO CARB STEADY,) LIQD Take 237 mLs by mouth 3 (three) times daily before meals.   ondansetron (ZOFRAN) 4 MG tablet Take 4 mg by mouth every 4 (four) hours as needed for nausea/vomiting.   pantoprazole (PROTONIX) 40 MG tablet TAKE 1 TABLET BY MOUTH EVERY DAY   pravastatin (PRAVACHOL) 80 MG tablet Take 1 tablet (80 mg total) by mouth at bedtime.   sildenafil (VIAGRA) 100 MG tablet Take 100 mg by mouth daily as needed for erectile dysfunction.   sulfamethoxazole-trimethoprim (BACTRIM DS) 800-160 MG tablet Take 1 tablet by mouth 3 (three) times a week.   tamsulosin (FLOMAX) 0.4 MG CAPS capsule TAKE 1 CAPSULE BY MOUTH EVERY DAY   triamcinolone cream (KENALOG) 0.1 % Apply 1 application topically 2 (two) times daily.   TRUEPLUS INSULIN SYRINGE 31G X 5/16" 1 ML MISC Use pen needle with insulin 2 times daily   Social History   Tobacco Use   Smoking status: Every Day    Packs/day: 0.50    Types: Cigarettes   Smokeless tobacco: Never  Substance Use Topics   Alcohol use: No   Family History  Problem Relation Age of Onset   Other Mother    Stroke Father     Hypertension Father    Epilepsy Father    Breast cancer Sister    Stroke Brother    Heart attack Brother    Diabetes Sister      Review of Systems  Constitutional:  Negative for activity change, appetite change, chills, fatigue, fever and unexpected weight change.  HENT:  Negative for congestion, ear pain, hearing loss, sinus pressure, sinus pain, sore throat and trouble swallowing.   Eyes:  Negative for pain and visual disturbance.  Respiratory:  Negative for cough, chest tightness, shortness of breath  and wheezing.   Cardiovascular:  Negative for chest pain, palpitations and leg swelling.  Gastrointestinal:  Negative for abdominal distention, abdominal pain, blood in stool, constipation, diarrhea, nausea and vomiting.  Genitourinary:  Negative for decreased urine volume, difficulty urinating, dysuria, penile pain and testicular pain.  Musculoskeletal:  Negative for arthralgias, back pain and joint swelling.  Skin:  Negative for rash.  Neurological:  Negative for dizziness, weakness, numbness and headaches.  Hematological:  Negative for adenopathy. Does not bruise/bleed easily.  Psychiatric/Behavioral:  Negative for agitation, sleep disturbance and suicidal ideas. The patient is not nervous/anxious.        Mood doesn't feel well because he does not like where he is living.    CBC:  Lab Results  Component Value Date   WBC 9.7 04/14/2021   HGB 11.9 (L) 04/14/2021   HCT 35.0 (L) 04/14/2021   MCH 31.2 04/14/2021   MCHC 31.6 04/14/2021   RDW 14.6 04/14/2021   PLT 228 04/14/2021   CMP: Lab Results  Component Value Date   NA 140 04/14/2021   K 5.2 (H) 04/14/2021   CL 109 04/14/2021   CO2 19 (L) 04/14/2021   ANIONGAP 16 (H) 04/14/2021   GLUCOSE 122 (H) 04/14/2021   BUN 79 (H) 04/14/2021   CREATININE 14.40 (H) 04/14/2021   GFRAA 7 (L) 06/02/2020   CALCIUM 8.6 (L) 04/14/2021   CALCIUM 7.7 (L) 01/14/2021   PROT 7.3 04/14/2021   BILITOT 0.6 04/14/2021   ALKPHOS 93  04/14/2021   ALT 14 04/14/2021   AST 18 04/14/2021   LIPID: Lab Results  Component Value Date   CHOL 147 02/14/2020   TRIG 125 02/14/2020   HDL 31 (L) 02/14/2020   LDLCALC 91 02/14/2020    Objective:  BP (!) 164/100 (BP Location: Right Arm, Patient Position: Sitting, Cuff Size: Normal)   Pulse 78   Temp 98 F (36.7 C) (Oral)   Ht 6' (1.829 m)   SpO2 97%   BMI 21.13 kg/m       BP Readings from Last 3 Encounters:  07/10/21 (!) 164/100  06/26/21 (!) 201/91  04/15/21 127/67   Wt Readings from Last 3 Encounters:  04/15/21 155 lb 12.8 oz (70.7 kg)  02/21/21 156 lb 8.4 oz (71 kg)  01/18/21 148 lb 5.9 oz (67.3 kg)    Physical Exam Constitutional:      General: He is not in acute distress.    Appearance: He is well-developed. He is not diaphoretic.  HENT:     Head: Normocephalic and atraumatic.     Right Ear: External ear normal.     Left Ear: External ear normal.  Eyes:     Conjunctiva/sclera: Conjunctivae normal.     Pupils: Pupils are equal, round, and reactive to light.  Neck:     Thyroid: No thyromegaly.  Cardiovascular:     Rate and Rhythm: Normal rate and regular rhythm.     Heart sounds: Murmur heard.  Systolic murmur is present with a grade of 2/6.    No friction rub. No gallop.  Pulmonary:     Effort: Pulmonary effort is normal. No respiratory distress.     Breath sounds: Normal breath sounds. No wheezing or rales.  Musculoskeletal:     Cervical back: Neck supple.  Lymphadenopathy:     Cervical: No cervical adenopathy.  Skin:    General: Skin is warm and dry.  Neurological:     Mental Status: He is alert and oriented to person, place, and  time.     Cranial Nerves: No cranial nerve deficit.     Motor: Weakness and abnormal muscle tone present.     Gait: Gait abnormal.     Deep Tendon Reflexes: Reflexes normal.     Comments: Strength right hand, arm, leg 3/5, with poor mobility, contracture of right hand. 5/5 left shoulder abduction, 4+/5  bicep/tricep left, 4/5 left leg flexion.   Psychiatric:        Behavior: Behavior normal.    Assessment/Plan: Health Maintenance Due  Topic Date Due   COVID-19 Vaccine (1) Never done   Pneumococcal Vaccine 1-77 Years old (1 - PCV) Never done   Zoster Vaccines- Shingrix (1 of 2) Never done   COLONOSCOPY (Pts 45-70yr Insurance coverage will need to be confirmed)  Never done   FOOT EXAM  01/02/2021   HEMOGLOBIN A1C  07/15/2021   Health Maintenance reviewed.  1. Primary hypertension Bp elevated today; but states that it has been running good/low at dialysis. He is not sure about all medications; we will get list from [facility] that we can make sure our list is up to date and make adjustments If needed. [facility] that he has contact to help with relocation if possible. Although he is not happy in current living facility and would like to be independent, he is unable to transfer self and although he states he can meal prep, I worry about ability for him to accomplish all IADLs. We will work on gManagement consultantwheelchair, but also refer back to PT so he can work on sHotel manager Would benefit as well from supplement to maintain protein and help with regaining strength.  - AMB Referral to [facility] 2. Type 2 diabetes mellitus with chronic kidney disease on chronic dialysis, with long-term current use of insulin ([place] Will request records from nephrology so that I can follow up on bloodwork/status.  - AMB Referral to [place] 3. Secondary hyperparathyroidism of renal origin ([facility] Managed by nephrology. See above.  - AMB [place]erral to [place] 4. Hyperlipidemia, unspecified hyperlipidemia type Currently on pravastatin 859mon our med list; will confirm once we get med list from living facility. Do not have recent lipid panel. May need to add to routine bloodwork at dialysis session.  - AMB Referral  to [facility]. History of cerebrovascular accident (CVA) with residual deficit - DME Wheelchair electric  6. Diabetic mononeuropathy associated with type 2 diabetes mellitus ([place]- DME Wheelchair electric  7. Physical deconditioning Will order pt once we are able to get paperwork from living facility.  - DME Wheelchair electric  8. CKD (chronic kidney disease) stage 5, GFR less than 15 ml/min (HCC) - AMB Referral to [facility] D[facility] Wheelchair electric  9. Tobacco abuse Not interested in quitting smoking.   10. AIDS (acquired immune deficiency syndrome) (HCF[place] PierceUncertain who is managing. Patient states there is doc at facility. Hasn't seen ID in our system, so I will follow up on facility notes.   11. Moderate episode of recurrent major depressive disorder ([place]CR[place] HeightsHis biggest upsetting factor is living facility. Feels that mood will improve if he can change this. Will continue to monitor.    Return in about 3 months (around 10/10/2021).  JuMicheline RoughMD  50 minutes spent in chart review, time with patient, plan for followup, exam, charting.

## 2021-07-10 NOTE — Telephone Encounter (Signed)
-----   Message from [name], MD sent at 07/10/2021 10:56 AM EDT ----- Patient needs physical therapy for weakness - he is at [facility] - can you call and see how to put this order in?

## 2021-07-10 NOTE — Telephone Encounter (Signed)
Spoke with Caryl Pina at [name] [phone]) and she stated a referral and the order can be faxed to [phone] and the physical therapy department will evaluate and treat the patient.  Referral placed and faxed as below.

## 2021-07-11 ENCOUNTER — Telehealth: Payer: Self-pay | Admitting: *Deleted

## 2021-07-11 NOTE — Chronic Care Management (AMB) (Signed)
  Chronic Care Management   Outreach Note  07/11/2021 Name: [name] MRN: [id number] DOB: 03-13-1956  Richard [name] is a 65 y.o. year old male who is a primary care patient of Koberlein, [name], MD. I reached out to [name] by phone today in response to a referral sent by Mr. Jennings Makris Degen's primary care provider.  An unsuccessful telephone outreach was attempted today. The patient was referred to the case management team for assistance with care management and care coordination.   Follow Up Plan: A HIPAA compliant phone message was left for the patient providing contact information and requesting a return call. The care management team will reach out to the patient again over the next 7 days. If patient returns call to provider office, please advise to call [facility] at [phone].  [facility]  Direct Dial: [phone]

## 2021-07-11 NOTE — Chronic Care Management (AMB) (Signed)
  Chronic Care Management   Note  07/11/2021 Name: Richard [name] MRN: [id number] DOB: [birth year]  Richard [name] is a 65 y.o. year old male who is a primary care patient of Koberlein, [name], MD. I reached out to [name] by phone today in response to a referral sent by Richard [name] PCP.  Richard [name] was given information about Chronic Care Management services today including:  CCM service includes personalized support from designated clinical staff supervised by his physician, including individualized plan of care and coordination with other care providers 24/7 contact phone numbers for assistance for urgent and routine care needs. Service will only be billed when office clinical staff spend 20 minutes or more in a month to coordinate care. Only one practitioner may furnish and bill the service in a calendar month. The patient may stop CCM services at any time (effective at the end of the month) by phone call to the office staff. The patient is responsible for co-pay (up to 20% after annual deductible is met) if co-pay is required by the individual health plan.   Patient agreed to services and verbal consent obtained.   Follow up plan: Telephone appointment with care management team member scheduled for:07/17/21  [facility]  Direct Dial: [phone]

## 2021-07-16 ENCOUNTER — Telehealth: Payer: Self-pay | Admitting: *Deleted

## 2021-07-16 ENCOUNTER — Encounter: Payer: Self-pay | Admitting: Family Medicine

## 2021-07-16 NOTE — Telephone Encounter (Signed)
Spoke with Caryl Pina, nurse at [facility] for information as to where to send the Rx. Per Caryl Pina, the DME order was faxed to [facility] at [phone] attn: Lenna Gilford.

## 2021-07-16 NOTE — Telephone Encounter (Signed)
-----   Message from [name], MD sent at 07/16/2021  1:42 PM EDT ----- Dme order for electric wheelchair created. Can you submit to insurance?

## 2021-07-17 ENCOUNTER — Ambulatory Visit (INDEPENDENT_AMBULATORY_CARE_PROVIDER_SITE_OTHER): Payer: Medicare HMO | Admitting: *Deleted

## 2021-07-17 DIAGNOSIS — G464 Cerebellar stroke syndrome: Secondary | ICD-10-CM

## 2021-07-17 DIAGNOSIS — I10 Essential (primary) hypertension: Secondary | ICD-10-CM

## 2021-07-17 DIAGNOSIS — I6930 Unspecified sequelae of cerebral infarction: Secondary | ICD-10-CM

## 2021-07-17 DIAGNOSIS — N186 End stage renal disease: Secondary | ICD-10-CM

## 2021-07-17 DIAGNOSIS — E1122 Type 2 diabetes mellitus with diabetic chronic kidney disease: Secondary | ICD-10-CM

## 2021-07-17 DIAGNOSIS — J9621 Acute and chronic respiratory failure with hypoxia: Secondary | ICD-10-CM

## 2021-07-17 DIAGNOSIS — Z992 Dependence on renal dialysis: Secondary | ICD-10-CM

## 2021-07-17 DIAGNOSIS — N185 Chronic kidney disease, stage 5: Secondary | ICD-10-CM

## 2021-07-17 DIAGNOSIS — B20 Human immunodeficiency virus [HIV] disease: Secondary | ICD-10-CM

## 2021-07-17 NOTE — Chronic Care Management (AMB) (Signed)
Chronic Care Management    Clinical Social Work Note  07/17/2021 Name: Richard [name] MRN: [id number] DOB: 1956/05/14  Richard [name] is a 65 y.o. year old male who is a primary care patient of Richard [name], Richard Berg, MD. The CCM team was consulted to assist the patient with chronic disease management and/or care coordination needs related to: Level of Care Concerns.   Engaged with patient by telephone for initial visit in response to provider referral for social work chronic care management and care coordination services.   Consent to Services:  The patient was given information about Chronic Care Management services, agreed to services, and gave verbal consent prior to initiation of services.  Please see initial visit note for detailed documentation.   Patient agreed to services and consent obtained.   Assessment: Review of patient past medical history, allergies, medications, and health status, including review of relevant consultants reports was performed today as part of a comprehensive evaluation and provision of chronic care management and care coordination services.     SDOH (Social Determinants of Health) assessments and interventions performed:  SDOH Interventions    Flowsheet Row Most Recent Value  SDOH Interventions   Food Insecurity Interventions Intervention Not Indicated  Financial Strain Interventions Intervention Not Indicated  Housing Interventions Intervention Not Indicated  Intimate Partner Violence Interventions Intervention Not Indicated  Physical Activity Interventions Patient Refused  Stress Interventions Offered Community Wellness Resources, Provide Counseling, Patient Refused  Social Connections Interventions Intervention Not Indicated, Patient Refused  Transportation Interventions Intervention Not Indicated  Depression Interventions/Treatment  Medication, Counseling, Patient refuses Treatment        Advanced Directives Status: Not ready or  willing to discuss.  CCM Care Plan  Allergies  Allergen Reactions   Oxycodone Nausea And Vomiting    Outpatient Encounter Medications as of 07/17/2021  Medication Sig Note   ACCU-CHEK AVIVA PLUS test strip USE 1 strip TO test blood sugar 2 TIMES DAILY TO 3 TIMES DAILY    acetaminophen (TYLENOL) 325 MG tablet Take 650 mg by mouth every 6 (six) hours as needed for moderate pain, fever or mild pain.    bictegravir-emtricitabine-tenofovir AF (BIKTARVY) 50-200-25 MG TABS tablet Take 1 tablet by mouth daily.    blood glucose meter kit and supplies KIT Dispense based on patient and insurance preference. Check blood sugar 4 times a day    carvedilol (COREG) 12.5 MG tablet Take 12.5 mg by mouth in the morning and at bedtime. 04/14/2021: Multiple calls to facility have not been answered - rolls to a voice mail that states calls will be returned on Monday    cetirizine (ZYRTEC) 10 MG tablet Take 10 mg by mouth daily.    Cholecalciferol (VITAMIN D) 50 MCG (2000 UT) tablet Take 1 tablet (2,000 Units total) by mouth in the morning. 04/14/2021: Not listed on "view all orders report" but list ends at "V" and page 7 is missing.    cloNIDine (CATAPRES) 0.1 MG tablet Take 1 tablet (0.1 mg total) by mouth 3 (three) times daily.    clopidogrel (PLAVIX) 75 MG tablet Take 1 tablet (75 mg total) by mouth daily.    Darbepoetin Alfa (ARANESP) 60 MCG/0.3ML SOSY injection Inject 0.3 mLs (60 mcg total) into the vein every Saturday with hemodialysis. 04/14/2021: Not listed on current "view all orders report" - possibly given at dialysis   doxepin (SINEQUAN) 75 MG capsule Take 75 mg by mouth at bedtime.    EMOLLIENT EX Apply 1 application topically See admin instructions.  Apply emollient lotion topically to arms and legs  one time daily for dry skin    epoetin alfa-epbx (RETACRIT) 79396 UNIT/ML injection 20,000 Units every Monday. Intramuscularly (for ESRD)    insulin glargine (LANTUS) 100 unit/mL SOPN Inject 5 Units into  the skin at bedtime.    Insulin Pen Needle (SURE COMFORT PEN NEEDLES) 31G X 8 MM MISC Use as directed twice daily with Novolog flex pen    levETIRAcetam (KEPPRA) 500 MG tablet Take 500 mg by mouth in the morning and at bedtime.    Misc. Devices (COMMODE 3-IN-1) MISC Use as directed    Misc. Devices (TRANSFER BENCH) MISC Use as directed    Multiple Vitamin (MULTIVITAMIN WITH MINERALS) TABS tablet Take 1 tablet by mouth at bedtime.    Nutritional Supplements (FEEDING SUPPLEMENT, NEPRO CARB STEADY,) LIQD Take 237 mLs by mouth 3 (three) times daily before meals. 04/14/2021: Not listed on current "view all orders report"   ondansetron (ZOFRAN) 4 MG tablet Take 4 mg by mouth every 4 (four) hours as needed for nausea/vomiting. 04/14/2021: Not listed on "view all orders report" but list ends at "V" and page 7 is missing.   pantoprazole (PROTONIX) 40 MG tablet TAKE 1 TABLET BY MOUTH EVERY DAY    pravastatin (PRAVACHOL) 80 MG tablet Take 1 tablet (80 mg total) by mouth at bedtime.    sildenafil (VIAGRA) 100 MG tablet Take 100 mg by mouth daily as needed for erectile dysfunction.    sulfamethoxazole-trimethoprim (BACTRIM DS) 800-160 MG tablet Take 1 tablet by mouth 3 (three) times a week.    tamsulosin (FLOMAX) 0.4 MG CAPS capsule TAKE 1 CAPSULE BY MOUTH EVERY DAY    triamcinolone cream (KENALOG) 0.1 % Apply 1 application topically 2 (two) times daily.    TRUEPLUS INSULIN SYRINGE 31G X 5/16" 1 ML MISC Use pen needle with insulin 2 times daily    No facility-administered encounter medications on file as of 07/17/2021.    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/04/2020   Syncope 06/01/2020   Secondary hyperparathyroidism of renal origin ([place]) 05/28/2020   Hypokalemia 05/16/2020   Allergy, unspecified, initial encounter 05/08/2020   Anaphylactic shock, unspecified, initial encounter 05/08/2020   Anemia in chronic kidney disease 88/64/8472   Complication of vascular dialysis catheter 05/08/2020   Dependence on renal dialysis ([place]) 05/08/2020   Headache, unspecified 05/08/2020   Iron deficiency anemia, unspecified 05/08/2020   Cerebellar stroke syndrome 05/07/2020   Encounter for feeding tube placement    Weakness    Creatinine elevation    Goals of care, counseling/discussion    Palliative care by specialist    ESRD (end stage renal disease) ([place])    AIDS (acquired immune deficiency syndrome) ([employer]) 04/13/2020   Seizure ([place]) 04/10/2020   History of cerebrovascular accident (CVA) with residual deficit 03/19/2020   CKD (chronic kidney disease) stage 5, GFR less than 15 ml/min ([place]) 02/15/2020   Neuropathy in diabetes ([place])    Migraines    Hypertension    GERD (gastroesophageal reflux disease)    DM2 (diabetes mellitus, type 2) ([facility])    Arthritis    Hyperlipidemia  08/25/2012   Chronic kidney disease, stage 4 (severe) ([place]) 08/23/2012   Tobacco abuse 08/23/2012   Malnutrition of moderate degree ([place]) 08/23/2012   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  Limited Social Support, Housing Barriers, Level of Care Concerns, ADL/IADL Limitations, and Lacks Knowledge of [employer].  Care Plan : LCSW Plan of Care  Updates made by Francis Gaines, LCSW since 07/17/2021 12:00 AM     Problem: Maintain My Quality of Life.   Priority: High     Goal: Maintain My Quality of Life.   Start Date: 07/17/2021  Expected End Date: 10/17/2021  This Visit's Progress: On track  Priority: High  Note:   Current  Barriers:   Patient with Hypertension, Cerebellar Stroke Syndrome, Congestive Heart Failure, Chronic Respiratory Failure, Type II Diabetes Mellitus, End Stage Renal Disease, Acquired Immune Deficiency Syndrome and Stage 4 Chronic Kidney Disease needs support, education, referrals, resources, advocacy and care coordination to obtain housing.   Patient has been residing at [facility] for 12 months, now ready to live independently, but unaware of housing options and availability.   Clinical Goals:  Patient will work with LCSW and [place] [employer] to address needs related to housing.   Clinical Interventions:  Patient interviewed and appropriate assessments performed. Collaboration with Primary Care Physician, Dr. [name] regarding development and update of comprehensive plan of care as evidenced by provider attestation and co-signature. Inter-disciplinary care team collaboration (see longitudinal plan of care). Assessment of needs, barriers, agencies contacted, as well as how impacting. Reviewed various housing resources and discussed options with patient. Discussed plans with patient for ongoing care management follow-up and provided patient with direct contact information for care management team. Advised patient to first apply for Section 8 Housing, at the [employer], providing him with contact information and location.  Other Clinical Interventions: PHQ 2 and PHQ 9 Depression Screen performed and results reviewed with patient.  Patient denies need for counseling and supportive services at this time, nor is patient experiencing homicidal or suicidal ideations. Solution-Focused Strategies implemented, Active Listening utilized, Engineer, petroleum provided, Problem Solving Skills utilized, Quality of Sleep assessed and Sleep Hygiene Techniques promoted. Patient Goals/Self-Care Activities: Work with [employer] and [employer], on a weekly/bi-weekly basis, to  obtain housing assistance and resources.   Complete application for Section 8 Housing and submit to the [employer] for processing.   Contact Affordable Apartments and Rental Properties in [facility], from list provided. Review list of [employer] and begin contacting landlords to inquire about areas of interest, pricing and availability. Contact LCSW directly (# [phone]) if you have questions, need assistance, or if additional social work needs are identified between now and our next scheduled telephone outreach call. Follow-Up Date:  07/29/2021 at 10:30am     Nat Christen LCSW Licensed Clinical Social Worker [place] [phone]

## 2021-07-17 NOTE — Patient Instructions (Signed)
Visit Information   PATIENT GOALS:   Goals Addressed               This Visit's Progress     Maintain My Quality of Life. (pt-stated)   On track     Timeframe:  Short-Term Goal Priority:  High Start Date:  07/17/2021                           Expected End Date:  10/17/2020                     Follow-Up Date:  07/29/2021 at 10:30am  Patient Goals/Self-Care Activities: Work with LCSW and [employer], on a weekly/bi-weekly basis, to obtain housing assistance and resources.   Complete application for Section 8 Housing and submit to the [employer] for processing.   Contact Affordable Apartments and Rental Properties in [facility], from list provided. Review list of [employer] and begin contacting landlords to inquire about areas of interest, pricing and availability. Contact LCSW directly (# [phone]) if you have questions, need assistance, or if additional social work needs are identified between now and our next scheduled telephone outreach call.        Consent to CCM Services: Mr. Naval was given information about Chronic Care Management services including:  CCM service includes personalized support from designated clinical staff supervised by his physician, including individualized plan of care and coordination with other care providers 24/7 contact phone numbers for assistance for urgent and routine care needs. Service will only be billed when office clinical staff spend 20 minutes or more in a month to coordinate care. Only one practitioner may furnish and bill the service in a calendar month. The patient may stop CCM services at any time (effective at the end of the month) by phone call to the office staff. The patient will be responsible for cost sharing (co-pay) of up to 20% of the service fee (after annual deductible is met).  Patient agreed to services and verbal consent obtained.   Patient  verbalizes understanding of instructions provided today and agrees to view in [place].   Telephone follow up appointment with care management team member scheduled for:  07/29/2021 at 10:30am  Nat Christen LCSW Licensed Clinical Social Worker [facility] [phone]   CLINICAL CARE PLAN: Patient Care Plan: LCSW Plan of Care     Problem Identified: Maintain My Quality of Life.   Priority: High     Goal: Maintain My Quality of Life.   Start Date: 07/17/2021  Expected End Date: 10/17/2021  This Visit's Progress: On track  Priority: High  Note:   Current Barriers:   Patient with Hypertension, Cerebellar Stroke Syndrome, Congestive Heart Failure, Chronic Respiratory Failure, Type II Diabetes Mellitus, End Stage Renal Disease, Acquired Immune Deficiency Syndrome and Stage 4 Chronic Kidney Disease needs support, education, referrals, resources, advocacy and care coordination to obtain housing.   Patient has been residing at [facility] for 12 months, now ready to live independently, but unaware of housing options and availability.   Clinical Goals:  Patient will work with LCSW and [place] [employer] to address needs related to housing.   Clinical Interventions:  Patient interviewed and appropriate assessments performed. Collaboration with Primary Care Physician, Dr. [name] regarding development and update of comprehensive plan of care as evidenced by provider attestation and co-signature. Inter-disciplinary care team collaboration (see longitudinal plan of care). Assessment  of needs, barriers, agencies contacted, as well as how impacting. Reviewed various housing resources and discussed options with patient. Discussed plans with patient for ongoing care management follow-up and provided patient with direct contact information for care management team. Advised patient to first apply for Section 8 Housing, at the [employer], providing him with  contact information and location.  Other Clinical Interventions: PHQ 2 and PHQ 9 Depression Screen performed and results reviewed with patient.  Patient denies need for counseling and supportive services at this time, nor is patient experiencing homicidal or suicidal ideations. Solution-Focused Strategies implemented, Active Listening utilized, Engineer, petroleum provided, Problem Solving Skills utilized, Quality of Sleep assessed and Sleep Hygiene Techniques promoted. Patient Goals/Self-Care Activities: Work with [employer] and [employer], on a weekly/bi-weekly basis, to obtain housing assistance and resources.   Complete application for Section 8 Housing and submit to the [employer] for processing.   Contact Affordable Apartments and Rental Properties in [facility], from list provided. Review list of [employer] and begin contacting landlords to inquire about areas of interest, pricing and availability. Contact LCSW directly (# [phone]) if you have questions, need assistance, or if additional social work needs are identified between now and our next scheduled telephone outreach call. Follow-Up Date:  07/29/2021 at 10:30am

## 2021-07-19 ENCOUNTER — Telehealth: Payer: Self-pay | Admitting: *Deleted

## 2021-07-19 NOTE — Telephone Encounter (Signed)
   Telephone encounter was:  Unsuccessful.  07/19/2021 Name: Richard [name] MRN: [id number] DOB: [birth year]  Unsuccessful outbound call made today to assist with:  Home Modifications  Outreach Attempt:  1st Attempt  A HIPAA compliant voice message was left requesting a return call.  Instructed patient to call back at [place] ,   Instructed patient to call back at [phone]  at their earliest convenience.    [facility]  [phone] [address] , [place] [address] Email : [email or web address]

## 2021-07-22 ENCOUNTER — Telehealth: Payer: Self-pay | Admitting: *Deleted

## 2021-07-22 DIAGNOSIS — G464 Cerebellar stroke syndrome: Secondary | ICD-10-CM

## 2021-07-22 DIAGNOSIS — N186 End stage renal disease: Secondary | ICD-10-CM | POA: Diagnosis not present

## 2021-07-22 DIAGNOSIS — Z794 Long term (current) use of insulin: Secondary | ICD-10-CM

## 2021-07-22 DIAGNOSIS — N185 Chronic kidney disease, stage 5: Secondary | ICD-10-CM | POA: Diagnosis not present

## 2021-07-22 DIAGNOSIS — E1122 Type 2 diabetes mellitus with diabetic chronic kidney disease: Secondary | ICD-10-CM

## 2021-07-22 DIAGNOSIS — I10 Essential (primary) hypertension: Secondary | ICD-10-CM | POA: Diagnosis not present

## 2021-07-22 DIAGNOSIS — Z992 Dependence on renal dialysis: Secondary | ICD-10-CM | POA: Diagnosis not present

## 2021-07-22 NOTE — Telephone Encounter (Signed)
   Telephone encounter was:  Unsuccessful.  07/22/2021 Name: Richard [name] MRN: [id number] DOB: 01-25-1956  Unsuccessful outbound call made today to assist with:  Transportation Needs  and Food Insecurity  Outreach Attempt:  2nd Attempt  A HIPAA compliant voice message was left requesting a return call.  Instructed patient to call back at   Instructed patient to call back at [phone]  at their earliest convenience. .  [place], Care Management  [phone] [address] , [place] [address] Email : [email or web address]

## 2021-07-25 ENCOUNTER — Telehealth: Payer: Self-pay | Admitting: *Deleted

## 2021-07-25 NOTE — Telephone Encounter (Signed)
   Telephone encounter was:  Unsuccessful.  07/25/2021 Name: Richard [name] MRN: [id number] DOB: 11/24/1955  Unsuccessful outbound call made today to assist with:   housing   Outreach Attempt:  3rd Attempt.  Referral closed unable to contact patient.  A HIPAA compliant voice message was left requesting a return call.  Instructed patient to call back at   Instructed patient to call back at [phone]  at their earliest convenience. .  [place], Care Management  [phone] [address] , [place] [address] Email : [email or web address]

## 2021-07-26 ENCOUNTER — Other Ambulatory Visit (HOSPITAL_COMMUNITY): Payer: Self-pay

## 2021-07-26 ENCOUNTER — Encounter (HOSPITAL_COMMUNITY): Payer: Self-pay

## 2021-07-29 ENCOUNTER — Encounter: Payer: Self-pay | Admitting: Infectious Disease

## 2021-07-29 ENCOUNTER — Telehealth: Payer: Self-pay | Admitting: *Deleted

## 2021-07-29 ENCOUNTER — Ambulatory Visit (INDEPENDENT_AMBULATORY_CARE_PROVIDER_SITE_OTHER): Payer: Medicare HMO | Admitting: Infectious Disease

## 2021-07-29 ENCOUNTER — Telehealth: Payer: Medicare HMO | Admitting: *Deleted

## 2021-07-29 ENCOUNTER — Other Ambulatory Visit: Payer: Self-pay

## 2021-07-29 VITALS — BP 182/84 | HR 80

## 2021-07-29 DIAGNOSIS — U071 COVID-19: Secondary | ICD-10-CM

## 2021-07-29 DIAGNOSIS — B20 Human immunodeficiency virus [HIV] disease: Secondary | ICD-10-CM | POA: Diagnosis not present

## 2021-07-29 DIAGNOSIS — Z992 Dependence on renal dialysis: Secondary | ICD-10-CM

## 2021-07-29 DIAGNOSIS — N186 End stage renal disease: Secondary | ICD-10-CM

## 2021-07-29 DIAGNOSIS — I5033 Acute on chronic diastolic (congestive) heart failure: Secondary | ICD-10-CM

## 2021-07-29 DIAGNOSIS — E785 Hyperlipidemia, unspecified: Secondary | ICD-10-CM | POA: Diagnosis not present

## 2021-07-29 DIAGNOSIS — E1122 Type 2 diabetes mellitus with diabetic chronic kidney disease: Secondary | ICD-10-CM

## 2021-07-29 DIAGNOSIS — G464 Cerebellar stroke syndrome: Secondary | ICD-10-CM

## 2021-07-29 DIAGNOSIS — Z794 Long term (current) use of insulin: Secondary | ICD-10-CM

## 2021-07-29 NOTE — Addendum Note (Signed)
Addended by: Caffie [place] on: 07/29/2021 03:49 PM   Modules accepted: Orders

## 2021-07-29 NOTE — Telephone Encounter (Signed)
  Care Management   Follow Up Note   07/29/2021 Name: Homer Miller MRN: [id number] DOB: 09-14-1956   Referred by: [name], MD  Reason for referral : Chronic Care Management in patient with Hypertension, Cerebellar Stroke Syndrome, Congestive Heart Failure, Chronic Respiratory Failure, Type II Diabetes Mellitus, End Stage Renal Disease, Acquired Immune Deficiency Syndrome and Stage 4 Chronic Kidney Disease.   An unsuccessful telephone outreach was attempted today. The patient was referred to the case management team for assistance with care management and care coordination. HIPAA compliant messages were left on voicemail, providing contact information, encouraging patient to return LCSW's call at his earliest convenience.  LCSW will make a second follow-up outreach call attempt within the next 5-7 business days, if a return call is not received from patient in the meantime.  Follow-Up Plan:  Request placed to Scheduling Care Guides to reschedule a follow-up outreach call for patient with LCSW.  Nat Christen LCSW Licensed Clinical Social Worker [place] [phone]

## 2021-07-29 NOTE — Progress Notes (Signed)
Subjective:  Chief complaint pain on his buttocks where he is sitting  Patient ID: Richard [name], male    DOB: [birth year], 65 y.o.   MRN: [id number]  HPI  Richard [name] is a now 65 year old black man living with multiple medical problems including poorly controlled diabetes hypertension end-stage renal disease on hemodialysis prior stroke who was admitted to the hospital with seizures and encephalopathy in 2021.  He was found have HIV and AIDS with a CD4 count of 39.  During that hospitalization we started dolutegravir and lamivudine.  Also started Bactrim for PCP prevention.  He was supposed to see me in September 2021 but has not been seen by [place] since then.  He now is residing in skilled nursing facility [facility] and is on [place] rather than Dovato.  Is not clear at all who made the switch in the antiviral regimen.  He was not sure why he was coming to clinic today and thought was to take care of a sore area on his buttocks.  Can see that he has had prior chronic pelvic pain and has had prior plain films that have not shown evidence of osteomyelitis.    Past Medical History:  Diagnosis Date   Altered mental status 02/13/2020   Arthritis    Chronic kidney disease    COVID-19 05/16/2020   Diabetes mellitus, type II, insulin dependent ([place])    Encephalopathy 04/13/2020   GERD (gastroesophageal reflux disease)    Hypertension    Hypertensive emergency 05/21/2012   Hypertensive urgency    Immune deficiency disorder ([place])    Meningoencephalitis    Migraines    Neuropathy in diabetes ([place])    bilat feet   Ruptured lumbar disc    Stroke ([place]) 2013   residual right sided weakness (arm>leg)    Past Surgical History:  Procedure Laterality Date   AV FISTULA PLACEMENT Left 04/25/2020   Procedure: LEFT ARM BRACHIOCEPHALIC ARTERIOVENOUS (AV) FISTULA CREATION;  Surgeon: Rosetta Posner, MD;  Location: [facility] OR;  Service: Vascular;  Laterality: Left;   IR FLUORO GUIDE CV LINE RIGHT   04/13/2020   IR REMOVAL TUN CV CATH W/O FL  01/14/2021   IR US GUIDE VASC ACCESS RIGHT  04/13/2020   KNEE ARTHROSCOPY Bilateral     Family History  Problem Relation Age of Onset   Other Mother    Stroke Father    Hypertension Father    Epilepsy Father    Breast cancer Sister    Stroke Brother    Heart attack Brother    Diabetes Sister       Social History   Socioeconomic History   Marital status: Single    Spouse name: Not on file   Number of children: 2   Years of education: 12   Highest education level: 12th grade  Occupational History   Not on file  Tobacco Use   Smoking status: Every Day    Packs/day: 0.50    Types: Cigarettes    Passive exposure: Current   Smokeless tobacco: Never  Vaping Use   Vaping Use: Never used  Substance and Sexual Activity   Alcohol use: No   Drug use: Yes    Types: Marijuana   Sexual activity: Not Currently  Other Topics Concern   Not on file  Social History Narrative   Not on file   Social Determinants of Health   Financial Resource Strain: Low Risk    Difficulty of Paying Living Expenses: Not very hard  Food Insecurity: No Food Insecurity   Worried About Charity fundraiser in the Last Year: Never true   Ran Out of Food in the Last Year: Never true  Transportation Needs: No Transportation Needs   Lack of Transportation (Medical): No   Lack of Transportation (Non-Medical): No  Physical Activity: Inactive   Days of Exercise per Week: 0 days   Minutes of Exercise per Session: 0 min  Stress: Stress Concern Present   Feeling of Stress : Very much  Social Connections: Socially Isolated   Frequency of Communication with Friends and Family: More than three times a week   Frequency of Social Gatherings with Friends and Family: More than three times a week   Attends Religious Services: Never   Marine scientist or Organizations: No   Attends Music therapist: Never   Marital Status: Divorced    Allergies   Allergen Reactions   Oxycodone Nausea And Vomiting     Current Outpatient Medications:    ACCU-CHEK AVIVA PLUS test strip, USE 1 strip TO test blood sugar 2 TIMES DAILY TO 3 TIMES DAILY, Disp: 100 strip, Rfl: 1   acetaminophen (TYLENOL) 325 MG tablet, Take 650 mg by mouth every 6 (six) hours as needed for moderate pain, fever or mild pain., Disp: , Rfl:    bictegravir-emtricitabine-tenofovir AF (BIKTARVY) 50-200-25 MG TABS tablet, Take 1 tablet by mouth daily., Disp: 30 tablet, Rfl: 0   blood glucose meter kit and supplies KIT, Dispense based on patient and insurance preference. Check blood sugar 4 times a day, Disp: 1 each, Rfl: 0   carvedilol (COREG) 12.5 MG tablet, Take 12.5 mg by mouth in the morning and at bedtime., Disp: , Rfl:    cetirizine (ZYRTEC) 10 MG tablet, Take 10 mg by mouth daily., Disp: , Rfl:    Cholecalciferol (VITAMIN D) 50 MCG (2000 UT) tablet, Take 1 tablet (2,000 Units total) by mouth in the morning., Disp: 30 tablet, Rfl: 0   cloNIDine (CATAPRES) 0.1 MG tablet, Take 1 tablet (0.1 mg total) by mouth 3 (three) times daily., Disp: 60 tablet, Rfl: 11   clopidogrel (PLAVIX) 75 MG tablet, Take 1 tablet (75 mg total) by mouth daily., Disp: 90 tablet, Rfl: 0   Darbepoetin Alfa (ARANESP) 60 MCG/0.3ML SOSY injection, Inject 0.3 mLs (60 mcg total) into the vein every Saturday with hemodialysis., Disp: 4.2 mL, Rfl:    doxepin (SINEQUAN) 75 MG capsule, Take 75 mg by mouth at bedtime., Disp: , Rfl:    EMOLLIENT EX, Apply 1 application topically See admin instructions. Apply emollient lotion topically to arms and legs  one time daily for dry skin, Disp: , Rfl:    epoetin alfa-epbx (RETACRIT) 31121 UNIT/ML injection, 20,000 Units every Monday. Intramuscularly (for ESRD), Disp: , Rfl:    insulin glargine (LANTUS) 100 unit/mL SOPN, Inject 5 Units into the skin at bedtime., Disp: , Rfl:    Insulin Pen Needle (SURE COMFORT PEN NEEDLES) 31G X 8 MM MISC, Use as directed twice daily with  Novolog flex pen, Disp: 100 each, Rfl: 3   levETIRAcetam (KEPPRA) 500 MG tablet, Take 500 mg by mouth in the morning and at bedtime., Disp: , Rfl:    Misc. Devices (COMMODE 3-IN-1) MISC, Use as directed, Disp: 1 each, Rfl: 0   Misc. Devices (TRANSFER BENCH) MISC, Use as directed, Disp: 1 each, Rfl: 0   Multiple Vitamin (MULTIVITAMIN WITH MINERALS) TABS tablet, Take 1 tablet by mouth at bedtime., Disp: , Rfl:  Nutritional Supplements (FEEDING SUPPLEMENT, NEPRO CARB STEADY,) LIQD, Take 237 mLs by mouth 3 (three) times daily before meals., Disp: , Rfl:    ondansetron (ZOFRAN) 4 MG tablet, Take 4 mg by mouth every 4 (four) hours as needed for nausea/vomiting., Disp: , Rfl:    pantoprazole (PROTONIX) 40 MG tablet, TAKE 1 TABLET BY MOUTH EVERY DAY, Disp: 90 tablet, Rfl: 0   pravastatin (PRAVACHOL) 80 MG tablet, Take 1 tablet (80 mg total) by mouth at bedtime., Disp: 90 tablet, Rfl: 0   sildenafil (VIAGRA) 100 MG tablet, Take 100 mg by mouth daily as needed for erectile dysfunction., Disp: , Rfl:    sulfamethoxazole-trimethoprim (BACTRIM DS) 800-160 MG tablet, Take 1 tablet by mouth 3 (three) times a week., Disp: 30 tablet, Rfl: 0   tamsulosin (FLOMAX) 0.4 MG CAPS capsule, TAKE 1 CAPSULE BY MOUTH EVERY DAY, Disp: 30 capsule, Rfl: 0   triamcinolone cream (KENALOG) 0.1 %, Apply 1 application topically 2 (two) times daily., Disp: , Rfl:    TRUEPLUS INSULIN SYRINGE 31G X 5/16" 1 ML MISC, Use pen needle with insulin 2 times daily, Disp: 100 each, Rfl: 1    Review of Systems  Unable to perform ROS: Dementia      Objective:   Physical Exam Constitutional:      Appearance: He is ill-appearing.  HENT:     Head: Normocephalic and atraumatic.  Skin:    General: Skin is dry.  Neurological:     Mental Status: He is alert.     Comments: He has right-sided hemiplegia  Psychiatric:        Attention and Perception: Attention normal.        Mood and Affect: Mood is depressed.        Speech: Speech is  delayed.        Behavior: Behavior normal.        Cognition and Memory: Cognition is impaired. Memory is impaired. He exhibits impaired recent memory and impaired remote memory.   We examined his sacrococcygeal area and there was no ulcers whatsoever there is an area on the left buttocks where bone is more easily palpable through soft tissue       Assessment & Plan:   HIV/AIDS:   We will recheck a viral load today and his CD4 count.  I am okay continuing BIKTARVY and Bactrim for PCP prevention.  He does need to be followed very closely and not left out of follow-up for more than a year.  End-stage renal disease on hemodialysis: He is not happy being on dialysis but he is really going to need to have all his other multiple medical problems better optimized if he were to hope for renal transplant.  Currently if that were the case it would be more critical to get him off of TAF and have him on Dovato  Hemiplegia due to stroke:  He has soreness in his buttocks where he sits for prolonged period of time:  Would recommend proper cushioning.  Fortunately does not have any evidence of ulceration or decubitus.  DM: in [facility] at present  Vaccine counseling: Refused vaccines for influenza and pneumococcus.  Cognitive deficits: query if he has multi-factorial dementia  Seizures: on AEDs.  Hyperlipidemia: continue statin  HTN: poorly controlled Today's Vitals   07/29/21 1439  BP: (!) 182/84  Pulse: 80   There is no height or weight on file to calculate BMI.  Vaccine counseling: recommended prevnar, flu COVID vaccinations

## 2021-07-30 ENCOUNTER — Encounter: Payer: Self-pay | Admitting: *Deleted

## 2021-07-30 ENCOUNTER — Telehealth: Payer: Self-pay | Admitting: *Deleted

## 2021-07-30 LAB — T-HELPER CELL (CD4) - (RCID CLINIC ONLY)
CD4 % Helper T Cell: 18 % — ABNORMAL LOW (ref 33–65)
CD4 T Cell Abs: 388 /uL — ABNORMAL LOW (ref 400–1790)

## 2021-07-30 NOTE — Chronic Care Management (AMB) (Signed)
  Care Management   Note  07/30/2021 Name: Richard [name] MRN: [id number] DOB: [birth year]  Richard [name] is a 65 y.o. year old male who is a primary care patient of [name], MD and is actively engaged with the care management team. I reached out to [name] by phone today to assist with re-scheduling a follow up visit with the Licensed Clinical Social Worker  Follow up plan: Unsuccessful telephone outreach attempt made. A HIPAA compliant phone message was left for the patient providing contact information and requesting a return call.  The care management team will reach out to the patient again over the next 7 days.  If patient returns call to provider office, please advise to call [place] at [phone].  [place] Management  Direct Dial: [phone]

## 2021-08-01 LAB — HIV RNA, RTPCR W/R GT (RTI, PI,INT)
HIV 1 RNA Quant: 60 copies/mL — ABNORMAL HIGH
HIV-1 RNA Quant, Log: 1.78 Log copies/mL — ABNORMAL HIGH

## 2021-08-07 NOTE — Chronic Care Management (AMB) (Signed)
  Care Management   Note  08/07/2021 Name: Huxley Shurley MRN: [id number] DOB: 01-30-1956  Rollen Sox Linford is a 65 y.o. year old male who is a primary care patient of [name], MD and is actively engaged with the care management team. I reached out to [name] by phone today to assist with re-scheduling a follow up visit with the Licensed Clinical Social Worker  Follow up plan: Unsuccessful telephone outreach attempt made. A HIPAA compliant phone message was left for the patient providing contact information and requesting a return call. The care management team will reach out to the patient again over the next 7 days.  If patient returns call to provider office, please advise to call [place] at [phone].  [facility] Management  Direct Dial: [phone]

## 2021-08-13 NOTE — Chronic Care Management (AMB) (Signed)
  Care Management   Note  08/13/2021 Name: Richard [name] MRN: [id number] DOB: Aug 25, 1956  Richard [name] is a 65 y.o. year old male who is a primary care patient of [name], MD and is actively engaged with the care management team. I reached out to [name] by phone today to assist with re-scheduling a follow up visit with the Licensed Clinical Social Worker  Follow up plan: A third unsuccessful telephone outreach attempt made. Unable to make contact on outreach attempts x 3. PCP [name], MD notified via routed documentation in medical record. We have been unable to make contact with the patient for follow up. The care management team is available to follow up with the patient after provider conversation with the patient regarding recommendation for care management engagement and subsequent re-referral to the care management team.    [place] Management  Direct Dial: [phone]

## 2021-08-18 NOTE — Telephone Encounter (Signed)
Social work hasn't been successful with setting up follow up with him. Notified me of 3 failed attempts. He will need to call them if he wants their help.

## 2021-08-20 ENCOUNTER — Telehealth: Payer: Self-pay | Admitting: *Deleted

## 2021-08-20 NOTE — Telephone Encounter (Signed)
-----   Message from [name], MD sent at 08/18/2021 12:30 PM EST -----    ----- Message ----- From: Osvaldo Shipper, NT Sent: 08/13/2021   2:34 PM EST To: [name], MD, #  3 unsuccessful outreach attempts to reschedule follow up with Social worker

## 2021-08-20 NOTE — Telephone Encounter (Signed)
No answer at the patient's cell number.

## 2021-08-23 ENCOUNTER — Encounter: Payer: Self-pay | Admitting: *Deleted

## 2021-08-23 NOTE — Telephone Encounter (Signed)
Message received stating the patients cell number is not in service.  Letter mailed to the patient's home address to contact our office.

## 2021-08-30 ENCOUNTER — Ambulatory Visit (INDEPENDENT_AMBULATORY_CARE_PROVIDER_SITE_OTHER): Payer: Medicare HMO | Admitting: Infectious Disease

## 2021-08-30 ENCOUNTER — Other Ambulatory Visit: Payer: Self-pay

## 2021-08-30 ENCOUNTER — Encounter: Payer: Self-pay | Admitting: Infectious Disease

## 2021-08-30 VITALS — BP 190/99 | HR 86 | Temp 98.6°F

## 2021-08-30 DIAGNOSIS — M7918 Myalgia, other site: Secondary | ICD-10-CM

## 2021-08-30 DIAGNOSIS — B20 Human immunodeficiency virus [HIV] disease: Secondary | ICD-10-CM | POA: Diagnosis not present

## 2021-08-30 DIAGNOSIS — E785 Hyperlipidemia, unspecified: Secondary | ICD-10-CM

## 2021-08-30 DIAGNOSIS — N186 End stage renal disease: Secondary | ICD-10-CM | POA: Diagnosis not present

## 2021-08-30 DIAGNOSIS — Z992 Dependence on renal dialysis: Secondary | ICD-10-CM | POA: Insufficient documentation

## 2021-08-30 HISTORY — DX: Myalgia, other site: M79.18

## 2021-08-30 HISTORY — DX: Dependence on renal dialysis: N18.6

## 2021-08-30 NOTE — Progress Notes (Signed)
Subjective:  Chief complaint continues to complain of pain in his buttocks  Patient ID: Richard [name], male    DOB: Sep 27, 1955, 65 y.o.   MRN: [id number]  HPI  Richard [name] is a now 65 year old black man -Birthday today!, living with multiple medical problems including poorly controlled diabetes hypertension end-stage renal disease on hemodialysis prior stroke who was admitted to the hospital with seizures and encephalopathy in 2021.  He was found have HIV and AIDS with a CD4 count of 39.  During that hospitalization we started dolutegravir and lamivudine.  Also started Bactrim for PCP prevention.  He was supposed to see me in September 2021 but has not been seen by [place] since then.  He now is residing in skilled nursing facility [facility] and is on [place] rather than Dovato.  Is not clear at all who made the switch in the antiviral regimen.  Regardless his viral load is suppressed and his CD4 count is actually reconstituting the he does not yet need criteria for having a CD4 above 200 for 6 months to stop his Bactrim for PCP prophylaxis.  He continues to complain of buttocks pain and says that they are willing to perform an ultrasound.        Past Medical History:  Diagnosis Date   Altered mental status 02/13/2020   Arthritis    Chronic kidney disease    COVID-19 05/16/2020   Diabetes mellitus, type II, insulin dependent ([place])    Encephalopathy 04/13/2020   GERD (gastroesophageal reflux disease)    Hypertension    Hypertensive emergency 05/21/2012   Hypertensive urgency    Immune deficiency disorder ([place])    Meningoencephalitis    Migraines    Neuropathy in diabetes ([place])    bilat feet   Ruptured lumbar disc    Stroke ([place]) 2013   residual right sided weakness (arm>leg)    Past Surgical History:  Procedure Laterality Date   AV FISTULA PLACEMENT Left 04/25/2020   Procedure: LEFT ARM BRACHIOCEPHALIC ARTERIOVENOUS (AV) FISTULA CREATION;  Surgeon: Rosetta Posner, MD;   Location: [facility] OR;  Service: Vascular;  Laterality: Left;   IR FLUORO GUIDE CV LINE RIGHT  04/13/2020   IR REMOVAL TUN CV CATH W/O FL  01/14/2021   IR US GUIDE VASC ACCESS RIGHT  04/13/2020   KNEE ARTHROSCOPY Bilateral     Family History  Problem Relation Age of Onset   Other Mother    Stroke Father    Hypertension Father    Epilepsy Father    Breast cancer Sister    Stroke Brother    Heart attack Brother    Diabetes Sister       Social History   Socioeconomic History   Marital status: Single    Spouse name: Not on file   Number of children: 2   Years of education: 12   Highest education level: 12th grade  Occupational History   Not on file  Tobacco Use   Smoking status: Every Day    Packs/day: 0.50    Types: Cigarettes    Passive exposure: Current   Smokeless tobacco: Never  Vaping Use   Vaping Use: Never used  Substance and Sexual Activity   Alcohol use: No   Drug use: Not Currently    Types: Marijuana   Sexual activity: Not Currently    Comment: declined condoms  Other Topics Concern   Not on file  Social History Narrative   Not on file   Social Determinants of Health  Financial Resource Strain: Low Risk    Difficulty of Paying Living Expenses: Not very hard  Food Insecurity: No Food Insecurity   Worried About Charity fundraiser in the Last Year: Never true   Ran Out of Food in the Last Year: Never true  Transportation Needs: No Transportation Needs   Lack of Transportation (Medical): No   Lack of Transportation (Non-Medical): No  Physical Activity: Inactive   Days of Exercise per Week: 0 days   Minutes of Exercise per Session: 0 min  Stress: Stress Concern Present   Feeling of Stress : Very much  Social Connections: Socially Isolated   Frequency of Communication with Friends and Family: More than three times a week   Frequency of Social Gatherings with Friends and Family: More than three times a week   Attends Religious Services: Never   Building surveyor or Organizations: No   Attends Music therapist: Never   Marital Status: Divorced    Allergies  Allergen Reactions   Oxycodone Nausea And Vomiting     Current Outpatient Medications:    ACCU-CHEK AVIVA PLUS test strip, USE 1 strip TO test blood sugar 2 TIMES DAILY TO 3 TIMES DAILY, Disp: 100 strip, Rfl: 1   acetaminophen (TYLENOL) 325 MG tablet, Take 650 mg by mouth every 6 (six) hours as needed for moderate pain, fever or mild pain., Disp: , Rfl:    bictegravir-emtricitabine-tenofovir AF (BIKTARVY) 50-200-25 MG TABS tablet, Take 1 tablet by mouth daily., Disp: 30 tablet, Rfl: 0   blood glucose meter kit and supplies KIT, Dispense based on patient and insurance preference. Check blood sugar 4 times a day, Disp: 1 each, Rfl: 0   carvedilol (COREG) 12.5 MG tablet, Take 12.5 mg by mouth in the morning and at bedtime., Disp: , Rfl:    cetirizine (ZYRTEC) 10 MG tablet, Take 10 mg by mouth daily., Disp: , Rfl:    Cholecalciferol (VITAMIN D) 50 MCG (2000 UT) tablet, Take 1 tablet (2,000 Units total) by mouth in the morning., Disp: 30 tablet, Rfl: 0   cloNIDine (CATAPRES) 0.1 MG tablet, Take 1 tablet (0.1 mg total) by mouth 3 (three) times daily., Disp: 60 tablet, Rfl: 11   clopidogrel (PLAVIX) 75 MG tablet, Take 1 tablet (75 mg total) by mouth daily., Disp: 90 tablet, Rfl: 0   Darbepoetin Alfa (ARANESP) 60 MCG/0.3ML SOSY injection, Inject 0.3 mLs (60 mcg total) into the vein every Saturday with hemodialysis., Disp: 4.2 mL, Rfl:    doxepin (SINEQUAN) 75 MG capsule, Take 75 mg by mouth at bedtime., Disp: , Rfl:    EMOLLIENT EX, Apply 1 application topically See admin instructions. Apply emollient lotion topically to arms and legs  one time daily for dry skin, Disp: , Rfl:    epoetin alfa-epbx (RETACRIT) 55974 UNIT/ML injection, 20,000 Units every Monday. Intramuscularly (for ESRD), Disp: , Rfl:    insulin glargine (LANTUS) 100 unit/mL SOPN, Inject 5 Units into the skin at  bedtime., Disp: , Rfl:    Insulin Pen Needle (SURE COMFORT PEN NEEDLES) 31G X 8 MM MISC, Use as directed twice daily with Novolog flex pen, Disp: 100 each, Rfl: 3   levETIRAcetam (KEPPRA) 500 MG tablet, Take 500 mg by mouth in the morning and at bedtime., Disp: , Rfl:    Misc. Devices (COMMODE 3-IN-1) MISC, Use as directed, Disp: 1 each, Rfl: 0   Misc. Devices (TRANSFER BENCH) MISC, Use as directed, Disp: 1 each, Rfl: 0   Multiple  Vitamin (MULTIVITAMIN WITH MINERALS) TABS tablet, Take 1 tablet by mouth at bedtime., Disp: , Rfl:    Nutritional Supplements (FEEDING SUPPLEMENT, NEPRO CARB STEADY,) LIQD, Take 237 mLs by mouth 3 (three) times daily before meals., Disp: , Rfl:    ondansetron (ZOFRAN) 4 MG tablet, Take 4 mg by mouth every 4 (four) hours as needed for nausea/vomiting., Disp: , Rfl:    pantoprazole (PROTONIX) 40 MG tablet, TAKE 1 TABLET BY MOUTH EVERY DAY, Disp: 90 tablet, Rfl: 0   pravastatin (PRAVACHOL) 80 MG tablet, Take 1 tablet (80 mg total) by mouth at bedtime., Disp: 90 tablet, Rfl: 0   sildenafil (VIAGRA) 100 MG tablet, Take 100 mg by mouth daily as needed for erectile dysfunction., Disp: , Rfl:    sulfamethoxazole-trimethoprim (BACTRIM DS) 800-160 MG tablet, Take 1 tablet by mouth 3 (three) times a week., Disp: 30 tablet, Rfl: 0   tamsulosin (FLOMAX) 0.4 MG CAPS capsule, TAKE 1 CAPSULE BY MOUTH EVERY DAY, Disp: 30 capsule, Rfl: 0   traMADol (ULTRAM) 50 MG tablet, Take 50 mg by mouth every 8 (eight) hours as needed., Disp: , Rfl:    triamcinolone cream (KENALOG) 0.1 %, Apply 1 application topically 2 (two) times daily., Disp: , Rfl:    TRUEPLUS INSULIN SYRINGE 31G X 5/16" 1 ML MISC, Use pen needle with insulin 2 times daily, Disp: 100 each, Rfl: 1    Review of Systems  Unable to perform ROS: Dementia      Objective:   Physical Exam Constitutional:      Appearance: He is well-developed.  HENT:     Head: Normocephalic and atraumatic.  Eyes:     Conjunctiva/sclera:  Conjunctivae normal.  Cardiovascular:     Rate and Rhythm: Normal rate and regular rhythm.  Pulmonary:     Effort: Pulmonary effort is normal. No respiratory distress.     Breath sounds: No wheezing.  Abdominal:     General: There is no distension.     Palpations: Abdomen is soft.  Musculoskeletal:        General: No tenderness. Normal range of motion.     Cervical back: Normal range of motion and neck supple.  Skin:    General: Skin is warm and dry.     Coloration: Skin is not pale.     Findings: No erythema or rash.  Neurological:     Mental Status: He is alert and oriented to person, place, and time.  Psychiatric:        Attention and Perception: Attention normal.        Mood and Affect: Mood normal.        Behavior: Behavior normal.        Thought Content: Thought content normal.        Cognition and Memory: Memory is impaired.        Judgment: Judgment normal.        Assessment & Plan:  HIV AIDS:  I reviewed his most recent viral load from November 7 which was 37  Lab Results  Component Value Date   HIV1RNAQUANT 60 (H) 07/29/2021     I reviewed his most recent CD4 count from same date which was 103  Lab Results  Component Value Date   CD4TABS 388 (L) 07/29/2021   I am continue his BIKTARVY prescription.  Note I had wanted to prescribe him Dovato before but someone changes I do not think I want to change it now on having the risk of him not getting  antivirals that his skilled nursing facility  Hyperlipidemia continue pravastatin  Hypertension: Poorly controlled he said he was not given his antihypertensives today Today's Vitals   08/30/21 1455  BP: (!) 190/99  Pulse: 86  Temp: 98.6 F (37 C)  TempSrc: Oral  SpO2: 98%  PainSc: 5   PainLoc: Generalized   There is no height or weight on file to calculate BMI.  CVA with right sided hemiplegia  Continue plavix, pravachol, coreg  DM: on insulin  ESRD on HD: HD on TU, TH and SAT   Buttock pain:  encouraged offloading. Another cushion may help and not sitting so much

## 2021-09-20 ENCOUNTER — Other Ambulatory Visit: Payer: Self-pay | Admitting: Family Medicine

## 2021-10-09 ENCOUNTER — Ambulatory Visit: Payer: Medicare HMO | Admitting: Family Medicine

## 2021-10-09 NOTE — Progress Notes (Deleted)
°  Shelly Shoultz DOB: [birth year] Encounter date: 10/09/2021  This is a 66 y.o. male who presents with No chief complaint on file.   History of present illness: Followed with infectious disease on 08/30/2021.  Last visit with me was 07/10/2021.  At that time he was living at [facility] and was not happy with his current living situation.  Chronic kidney disease on dialysis: Deconditioning: Depression: Hypertension: Chronic diastolic heart failure: GERD: Type 2 diabetes: Hyperlipidemia: Aids:   HPI   Allergies  Allergen Reactions   Oxycodone Nausea And Vomiting   No outpatient medications have been marked as taking for the 10/09/21 encounter (Appointment) with [name], MD.    Review of Systems  Objective:  There were no vitals taken for this visit.      BP Readings from Last 3 Encounters:  08/30/21 (!) 190/99  07/29/21 (!) 182/84  07/10/21 (!) 164/100   Wt Readings from Last 3 Encounters:  04/15/21 155 lb 12.8 oz (70.7 kg)  02/21/21 156 lb 8.4 oz (71 kg)  01/18/21 148 lb 5.9 oz (67.3 kg)    Physical Exam  Assessment/Plan  There are no diagnoses linked to this encounter.       [name], MD

## 2021-10-14 ENCOUNTER — Telehealth: Payer: Self-pay | Admitting: *Deleted

## 2021-10-14 ENCOUNTER — Ambulatory Visit: Payer: Medicare HMO | Admitting: Family Medicine

## 2021-10-14 NOTE — Telephone Encounter (Signed)
-----   Message from [name], MD sent at 10/14/2021  7:24 AM EST ----- Patient is scheduled as last visit today. He no showed for scheduled visit last weds. I think it's worth calling to get verbal confirmation that he is coming to appointment. If we cannot get him to verbally confirm I would add in an acute slot in his place.

## 2021-10-14 NOTE — Telephone Encounter (Signed)
Spoke with Customer service manager at [facility] [phone]) to inform her of the message below.  Richard [name] stated the patient is in a skilled care facility and their provider would treat the patient for any needs he has and if he is seen here, our office would not be paid.  Appt was cancelled and message sent to PCP.

## 2021-10-14 NOTE — Telephone Encounter (Signed)
Please see JoAnne's note. If that is the case, then patient needs to be made aware. He has had a handful of no show visits, but had reached out to return seeing me here in the office because he was feeling like his needs were not being med at [facility]. If insurance will not cover for both, then really I should be removed as primary care for him. His noncompliance with visits does make it difficult to care for him, and if he has an on-site doc addressing his medical needs, then he should be covered.

## 2021-10-14 NOTE — Telephone Encounter (Signed)
I think that might be best; but we could let him know that it is due to no -show visits but also provider leaving and no available provider to take on care.

## 2021-10-19 ENCOUNTER — Inpatient Hospital Stay (HOSPITAL_COMMUNITY)
Admission: EM | Admit: 2021-10-19 | Discharge: 2021-10-22 | DRG: 304 | Disposition: A | Discharge status: Skilled Nursing Facility | Payer: No Typology Code available for payment source | Source: Skilled Nursing Facility | Attending: Internal Medicine | Admitting: Internal Medicine

## 2021-10-19 ENCOUNTER — Other Ambulatory Visit: Payer: Self-pay

## 2021-10-19 ENCOUNTER — Encounter (HOSPITAL_COMMUNITY): Payer: Self-pay

## 2021-10-19 ENCOUNTER — Emergency Department (HOSPITAL_COMMUNITY): Payer: No Typology Code available for payment source

## 2021-10-19 DIAGNOSIS — E119 Type 2 diabetes mellitus without complications: Secondary | ICD-10-CM

## 2021-10-19 DIAGNOSIS — I6930 Unspecified sequelae of cerebral infarction: Secondary | ICD-10-CM | POA: Diagnosis not present

## 2021-10-19 DIAGNOSIS — E1122 Type 2 diabetes mellitus with diabetic chronic kidney disease: Secondary | ICD-10-CM | POA: Diagnosis not present

## 2021-10-19 DIAGNOSIS — Z8616 Personal history of COVID-19: Secondary | ICD-10-CM

## 2021-10-19 DIAGNOSIS — R9431 Abnormal electrocardiogram [ECG] [EKG]: Secondary | ICD-10-CM

## 2021-10-19 DIAGNOSIS — Z79899 Other long term (current) drug therapy: Secondary | ICD-10-CM

## 2021-10-19 DIAGNOSIS — Z792 Long term (current) use of antibiotics: Secondary | ICD-10-CM

## 2021-10-19 DIAGNOSIS — Z20822 Contact with and (suspected) exposure to covid-19: Secondary | ICD-10-CM | POA: Diagnosis present

## 2021-10-19 DIAGNOSIS — E785 Hyperlipidemia, unspecified: Secondary | ICD-10-CM | POA: Diagnosis present

## 2021-10-19 DIAGNOSIS — Z716 Tobacco abuse counseling: Secondary | ICD-10-CM

## 2021-10-19 DIAGNOSIS — B20 Human immunodeficiency virus [HIV] disease: Secondary | ICD-10-CM | POA: Diagnosis present

## 2021-10-19 DIAGNOSIS — J309 Allergic rhinitis, unspecified: Secondary | ICD-10-CM | POA: Diagnosis not present

## 2021-10-19 DIAGNOSIS — I5042 Chronic combined systolic (congestive) and diastolic (congestive) heart failure: Secondary | ICD-10-CM | POA: Diagnosis present

## 2021-10-19 DIAGNOSIS — Z8661 Personal history of infections of the central nervous system: Secondary | ICD-10-CM

## 2021-10-19 DIAGNOSIS — R55 Syncope and collapse: Secondary | ICD-10-CM | POA: Diagnosis present

## 2021-10-19 DIAGNOSIS — N186 End stage renal disease: Secondary | ICD-10-CM

## 2021-10-19 DIAGNOSIS — E1165 Type 2 diabetes mellitus with hyperglycemia: Secondary | ICD-10-CM | POA: Diagnosis present

## 2021-10-19 DIAGNOSIS — T1490XA Injury, unspecified, initial encounter: Secondary | ICD-10-CM

## 2021-10-19 DIAGNOSIS — N2581 Secondary hyperparathyroidism of renal origin: Secondary | ICD-10-CM | POA: Diagnosis present

## 2021-10-19 DIAGNOSIS — I132 Hypertensive heart and chronic kidney disease with heart failure and with stage 5 chronic kidney disease, or end stage renal disease: Secondary | ICD-10-CM | POA: Diagnosis present

## 2021-10-19 DIAGNOSIS — I10 Essential (primary) hypertension: Secondary | ICD-10-CM | POA: Diagnosis present

## 2021-10-19 DIAGNOSIS — I69351 Hemiplegia and hemiparesis following cerebral infarction affecting right dominant side: Secondary | ICD-10-CM

## 2021-10-19 DIAGNOSIS — E1169 Type 2 diabetes mellitus with other specified complication: Secondary | ICD-10-CM

## 2021-10-19 DIAGNOSIS — Z993 Dependence on wheelchair: Secondary | ICD-10-CM

## 2021-10-19 DIAGNOSIS — Z7902 Long term (current) use of antithrombotics/antiplatelets: Secondary | ICD-10-CM

## 2021-10-19 DIAGNOSIS — I169 Hypertensive crisis, unspecified: Secondary | ICD-10-CM | POA: Diagnosis not present

## 2021-10-19 DIAGNOSIS — Z992 Dependence on renal dialysis: Secondary | ICD-10-CM

## 2021-10-19 DIAGNOSIS — I150 Renovascular hypertension: Secondary | ICD-10-CM | POA: Diagnosis not present

## 2021-10-19 DIAGNOSIS — S0990XA Unspecified injury of head, initial encounter: Secondary | ICD-10-CM

## 2021-10-19 DIAGNOSIS — S0083XA Contusion of other part of head, initial encounter: Secondary | ICD-10-CM | POA: Diagnosis present

## 2021-10-19 DIAGNOSIS — Z885 Allergy status to narcotic agent status: Secondary | ICD-10-CM

## 2021-10-19 DIAGNOSIS — Z820 Family history of epilepsy and other diseases of the nervous system: Secondary | ICD-10-CM

## 2021-10-19 DIAGNOSIS — F17210 Nicotine dependence, cigarettes, uncomplicated: Secondary | ICD-10-CM | POA: Diagnosis present

## 2021-10-19 DIAGNOSIS — W050XXA Fall from non-moving wheelchair, initial encounter: Secondary | ICD-10-CM | POA: Insufficient documentation

## 2021-10-19 DIAGNOSIS — Z823 Family history of stroke: Secondary | ICD-10-CM

## 2021-10-19 DIAGNOSIS — S60512A Abrasion of left hand, initial encounter: Secondary | ICD-10-CM | POA: Diagnosis present

## 2021-10-19 DIAGNOSIS — R569 Unspecified convulsions: Secondary | ICD-10-CM

## 2021-10-19 DIAGNOSIS — G40909 Epilepsy, unspecified, not intractable, without status epilepticus: Secondary | ICD-10-CM

## 2021-10-19 DIAGNOSIS — N400 Enlarged prostate without lower urinary tract symptoms: Secondary | ICD-10-CM | POA: Diagnosis present

## 2021-10-19 DIAGNOSIS — Z720 Tobacco use: Secondary | ICD-10-CM | POA: Diagnosis present

## 2021-10-19 DIAGNOSIS — Z794 Long term (current) use of insulin: Secondary | ICD-10-CM

## 2021-10-19 DIAGNOSIS — Z79891 Long term (current) use of opiate analgesic: Secondary | ICD-10-CM

## 2021-10-19 DIAGNOSIS — D631 Anemia in chronic kidney disease: Secondary | ICD-10-CM | POA: Diagnosis present

## 2021-10-19 DIAGNOSIS — K219 Gastro-esophageal reflux disease without esophagitis: Secondary | ICD-10-CM | POA: Diagnosis present

## 2021-10-19 DIAGNOSIS — E1142 Type 2 diabetes mellitus with diabetic polyneuropathy: Secondary | ICD-10-CM | POA: Diagnosis present

## 2021-10-19 HISTORY — DX: Human immunodeficiency virus (HIV) disease: B20

## 2021-10-19 HISTORY — DX: Epilepsy, unspecified, not intractable, without status epilepticus: G40.909

## 2021-10-19 HISTORY — DX: Asymptomatic human immunodeficiency virus (hiv) infection status: Z21

## 2021-10-19 LAB — COMPREHENSIVE METABOLIC PANEL
ALT: 9 U/L (ref 0–44)
AST: 18 U/L (ref 15–41)
Albumin: 3.1 g/dL — ABNORMAL LOW (ref 3.5–5.0)
Alkaline Phosphatase: 110 U/L (ref 38–126)
Anion gap: 13 (ref 5–15)
BUN: 37 mg/dL — ABNORMAL HIGH (ref 8–23)
CO2: 26 mmol/L (ref 22–32)
Calcium: 8.3 mg/dL — ABNORMAL LOW (ref 8.9–10.3)
Chloride: 98 mmol/L (ref 98–111)
Creatinine, Ser: 9.6 mg/dL — ABNORMAL HIGH (ref 0.61–1.24)
GFR, Estimated: 6 mL/min — ABNORMAL LOW (ref >60)
Glucose, Bld: 164 mg/dL — ABNORMAL HIGH (ref 70–99)
Potassium: 4.7 mmol/L (ref 3.5–5.1)
Sodium: 137 mmol/L (ref 135–145)
Total Bilirubin: 0.4 mg/dL (ref 0.3–1.2)
Total Protein: 6.5 g/dL (ref 6.5–8.1)

## 2021-10-19 LAB — CBC WITH DIFFERENTIAL/PLATELET
Abs Immature Granulocytes: 0.03 10*3/uL (ref 0.00–0.07)
Basophils Absolute: 0 10*3/uL (ref 0.0–0.1)
Basophils Relative: 1 %
Eosinophils Absolute: 0.3 10*3/uL (ref 0.0–0.5)
Eosinophils Relative: 4 %
HCT: 37.3 % — ABNORMAL LOW (ref 39.0–52.0)
Hemoglobin: 11.6 g/dL — ABNORMAL LOW (ref 13.0–17.0)
Immature Granulocytes: 0 %
Lymphocytes Relative: 27 %
Lymphs Abs: 1.9 10*3/uL (ref 0.7–4.0)
MCH: 31.7 pg (ref 26.0–34.0)
MCHC: 31.1 g/dL (ref 30.0–36.0)
MCV: 101.9 fL — ABNORMAL HIGH (ref 80.0–100.0)
Monocytes Absolute: 0.6 10*3/uL (ref 0.1–1.0)
Monocytes Relative: 9 %
Neutro Abs: 4.1 10*3/uL (ref 1.7–7.7)
Neutrophils Relative %: 59 %
Platelets: 176 10*3/uL (ref 150–400)
RBC: 3.66 MIL/uL — ABNORMAL LOW (ref 4.22–5.81)
RDW: 17.5 % — ABNORMAL HIGH (ref 11.5–15.5)
WBC: 6.9 10*3/uL (ref 4.0–10.5)
nRBC: 0 % (ref 0.0–0.2)

## 2021-10-19 LAB — RESP PANEL BY RT-PCR (FLU A&B, COVID) ARPGX2
Influenza A by PCR: NEGATIVE
Influenza B by PCR: NEGATIVE
SARS Coronavirus 2 by RT PCR: NEGATIVE

## 2021-10-19 LAB — CBG MONITORING, ED: Glucose-Capillary: 190 mg/dL — ABNORMAL HIGH (ref 70–99)

## 2021-10-19 LAB — MAGNESIUM: Magnesium: 3.9 mg/dL — ABNORMAL HIGH (ref 1.7–2.4)

## 2021-10-19 MED ORDER — BICTEGRAVIR-EMTRICITAB-TENOFOV 50-200-25 MG PO TABS
1.0000 | ORAL_TABLET | Freq: Every day | ORAL | Status: DC
  Administered 2021-10-19 – 2021-10-20 (×2): 1 via ORAL
  Filled 2021-10-19 (×3): qty 1

## 2021-10-19 MED ORDER — PANTOPRAZOLE SODIUM 40 MG PO TBEC
40.0000 mg | DELAYED_RELEASE_TABLET | Freq: Every day | ORAL | Status: DC
  Administered 2021-10-20 – 2021-10-22 (×3): 40 mg via ORAL
  Filled 2021-10-19 (×3): qty 1

## 2021-10-19 MED ORDER — NITROGLYCERIN IN D5W 200-5 MCG/ML-% IV SOLN
0.0000 ug/min | INTRAVENOUS | Status: DC
  Administered 2021-10-19: 5 ug/min via INTRAVENOUS
  Administered 2021-10-20: 25 ug/min via INTRAVENOUS
  Administered 2021-10-20: 30 ug/min via INTRAVENOUS
  Filled 2021-10-19: qty 250

## 2021-10-19 MED ORDER — TAMSULOSIN HCL 0.4 MG PO CAPS
0.4000 mg | ORAL_CAPSULE | Freq: Every day | ORAL | Status: DC
  Administered 2021-10-20 – 2021-10-22 (×3): 0.4 mg via ORAL
  Filled 2021-10-19 (×4): qty 1

## 2021-10-19 MED ORDER — INSULIN ASPART 100 UNIT/ML IJ SOLN
0.0000 [IU] | Freq: Three times a day (TID) | INTRAMUSCULAR | Status: DC
  Administered 2021-10-20: 1 [IU] via SUBCUTANEOUS

## 2021-10-19 MED ORDER — TRAMADOL HCL 50 MG PO TABS
50.0000 mg | ORAL_TABLET | Freq: Three times a day (TID) | ORAL | Status: DC | PRN
  Administered 2021-10-20: 50 mg via ORAL
  Filled 2021-10-19: qty 1

## 2021-10-19 MED ORDER — CARVEDILOL 12.5 MG PO TABS
12.5000 mg | ORAL_TABLET | Freq: Two times a day (BID) | ORAL | Status: DC
  Administered 2021-10-19 – 2021-10-22 (×6): 12.5 mg via ORAL
  Filled 2021-10-19 (×6): qty 1

## 2021-10-19 MED ORDER — ACETAMINOPHEN 650 MG RE SUPP: 650.0000 mg | Freq: Four times a day (QID) | RECTAL | Status: DC | PRN

## 2021-10-19 MED ORDER — CLOPIDOGREL BISULFATE 75 MG PO TABS
75.0000 mg | ORAL_TABLET | Freq: Every day | ORAL | Status: DC
  Administered 2021-10-20 – 2021-10-22 (×3): 75 mg via ORAL
  Filled 2021-10-19 (×3): qty 1

## 2021-10-19 MED ORDER — CLONIDINE HCL 0.1 MG PO TABS
0.1000 mg | ORAL_TABLET | Freq: Three times a day (TID) | ORAL | Status: DC
  Administered 2021-10-19 – 2021-10-22 (×8): 0.1 mg via ORAL
  Filled 2021-10-19 (×8): qty 1

## 2021-10-19 MED ORDER — CALCIUM ACETATE (PHOS BINDER) 667 MG/5ML PO SOLN
1334.0000 mg | Freq: Three times a day (TID) | ORAL | Status: DC
  Administered 2021-10-20 – 2021-10-21 (×5): 1334 mg via ORAL
  Filled 2021-10-19 (×9): qty 10

## 2021-10-19 MED ORDER — PRAVASTATIN SODIUM 40 MG PO TABS
80.0000 mg | ORAL_TABLET | Freq: Every day | ORAL | Status: DC
  Administered 2021-10-19 – 2021-10-21 (×3): 80 mg via ORAL
  Filled 2021-10-19 (×3): qty 2

## 2021-10-19 MED ORDER — LEVETIRACETAM 500 MG PO TABS
500.0000 mg | ORAL_TABLET | Freq: Two times a day (BID) | ORAL | Status: DC
  Administered 2021-10-19 – 2021-10-22 (×6): 500 mg via ORAL
  Filled 2021-10-19 (×6): qty 1

## 2021-10-19 MED ORDER — NICOTINE 14 MG/24HR TD PT24: 14.0000 mg | MEDICATED_PATCH | Freq: Every day | TRANSDERMAL | Status: DC | PRN

## 2021-10-19 MED ORDER — LORATADINE 10 MG PO TABS
10.0000 mg | ORAL_TABLET | Freq: Every day | ORAL | Status: DC
  Administered 2021-10-19: 10 mg via ORAL
  Filled 2021-10-19: qty 1

## 2021-10-19 MED ORDER — ACETAMINOPHEN 325 MG PO TABS
650.0000 mg | ORAL_TABLET | Freq: Four times a day (QID) | ORAL | Status: DC | PRN
  Administered 2021-10-20 – 2021-10-21 (×2): 650 mg via ORAL
  Filled 2021-10-19 (×2): qty 2

## 2021-10-19 MED ORDER — HYDRALAZINE HCL 20 MG/ML IJ SOLN
10.0000 mg | Freq: Once | INTRAMUSCULAR | Status: AC
  Administered 2021-10-19: 10 mg via INTRAVENOUS
  Filled 2021-10-19: qty 1

## 2021-10-19 NOTE — H&P (Signed)
History and Physical    PLEASE NOTE THAT DRAGON DICTATION SOFTWARE WAS USED IN THE CONSTRUCTION OF THIS NOTE.   Richard [name] DDU:[id number] DOB: May 06, 1956 DOA: 10/19/2021  PCP: [name], MD  Patient coming from: home   I have personally briefly reviewed patient's old medical records in [place]  Chief Complaint: Syncope  HPI: Richard [name] is a 66 y.o. 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   The patient reports that he was at his baseline level of health earlier today, outside in a wheelchair in the context of residual right hemiparesis setting from ischemic CVA in 2013, smoking a cigarette, when he reportedly lost consciousness.  Denies any associated preceding sensation of dizziness, lightheadedness, diaphoresis, or subjective sensation of ending loss of consciousness.  He notes that he was simply sitting in his wheelchair, smoking cigarette, and the next thing that he knew, he was in the ambulance being taken to [facility] emergency department for further evaluation management this episode of loss of consciousness.  The duration of his loss of consciousness is currently unclear, although there were no reports of associated tonic-clonic activity.  Additionally, episode was not associate with any tongue biting or loss of bowel or bladder function.  Medical history is notable for history of seizure disorder, for which the patient reports good compliance on his home Keppra, without any recent dose modifications to his home regimen.  As the patient was in his  wheelchair, he reportedly did not fall to the sidewalk below, and did not hit his head as a component of this episode.  In the setting of an ischemic CVA in 2013, he confirms residual right hemiparesis, and relative his baseline, denies any associated acute focal weakness, acute focal numbness, paresthesias, facial droop, slurred speech, expressive aphasia, acute change in vision, dysphagia, or vertigo.  Denies any associated ensuing headache or neck pain.   As it relates to this episode of syncope, the patient denies any recent, immediately preceding, or ensuing chest pain, sob, palpitations, diaphoresis, nausea, vomiting dizziness.  Denies any prior history of syncopal event.    Denies any recent change in his oral intake, including that of consumption of water.  Also denies any recent nausea vomiting, diarrhea.  No recent subjective fever, chills, rigors, generalized myalgias. No recent neck stiffness, rhinitis, rhinorrhea, sore throat, wheezing, cough, abdominal pain, or rash.  No recent worsening of peripheral edema, nor any calf tenderness, or new lower extremity erythema. Denies any recent hemoptysis.    He confirms a history of end-stage renal disease for which she is on hemodialysis, on Tuesday, Thursday, Saturday schedule, with most recent dialysis occurring on Thursday, 10/17/2021.  He conveys that even though his scheduled for HD is Tuesday, Thursday, Saturday, he states that at baseline, and he rarely attends his Saturday schedule, and is effectively on a Tuesday Thursday hemodialysis session over the course of the last several years, conveying that skipping his Saturday session is not a new approach to his hemodialysis.  Also acknowledges a history of hypertension, and reports good compliance with his home antihypertensive regimen that includes Coreg and oral clonidine.  Denies any recent modifications of his home  blood pressure regimen.  Of note, he is also on Flomax as an outpatient.  Denies  any recent use of recreational drugs.      ED Course:  Vital signs in the ED were notable for the following: Afebrile; heart rate 74-83; blood pressure 220/100 -228/99; respiratory rate 14-16, oxygen saturation 96 to 100% on room air.  Labs were notable for the following: CMP notable for the following: Potassium 4.7, bicarbonate 26, anion gap 13, glucose 164, calcium corrected for mild hypoalbuminemia and 9.1, albumin 3.1, otherwise liver enzymes found to be the [employer].  CBC notable for white cell count 7000 and, hemoglobin 11.6.  COVID-19/[place] PCR found to be negative.  Imaging and additional notable ED work-up: EKG showed sinus rhythm with heart rate 74, QTC prolongation of 514 ms, and no evidence of T wave or ST changes, including no evidence of ST elevation.  Chest x-ray showed no evidence of acute cardiopulmonary process.  Noncontrast CT head showed no evidence of acute intracranial process, including no evidence of intracranial hemorrhage or acute ischemic infarct.  EDP discussed patient's case with on-call neurology, Dr. Hortense Ramal, Who, in the setting of a history of seizures, recommended EEG, without associated recommendation for MRI brain.  Ensuing EEG showed no evidence of seizure-like findings.   Additionally, EDP consulted day admitting hospitalist, who recommended no further work-up regarding the patient's syncope, but rather felt that the patient could be discharged to home if EEG monitoring was normal in and hypertensive blood pressure improved in the interval.  However, following resumption of home Coreg and home clonidine, and in addition to receipt of a dose of IV hydralazine, systolic pressures remained greater than 200, prompting overnight observation for further evaluation management of hypertensive crisis.  While in the ED, the following were administered: Coreg 12.5 mg p.o. x1, clonidine 0.1 mg p.o. x1, hydralazine 10 mg IV x1, Keppra 500 mg p.o. x1, loratadine 10 mg p.o.  x1.     Review of Systems: As per HPI otherwise 10 point review of systems negative.   Past Medical History:  Diagnosis Date   COVID-19 05/16/2020   Diabetes mellitus, type II, insulin dependent ([place])    ESRD on hemodialysis ([place]) 08/30/2021   GERD (gastroesophageal reflux disease)    HIV (human immunodeficiency virus infection) ([place])    Hypertension    Meningoencephalitis    Neuropathy in diabetes ([place])    bilat feet   Ruptured lumbar disc    Seizure disorder ([place])    Stroke ([place]) 2013   residual right sided weakness (arm>leg)    Past Surgical History:  Procedure Laterality Date   AV FISTULA PLACEMENT Left 04/25/2020   Procedure: LEFT ARM BRACHIOCEPHALIC ARTERIOVENOUS (AV) FISTULA CREATION;  Surgeon: Rosetta Posner, MD;  Location: [facility] OR;  Service: Vascular;  Laterality: Left;   IR FLUORO GUIDE CV LINE RIGHT  04/13/2020   IR REMOVAL TUN CV CATH W/O FL  01/14/2021   IR US GUIDE VASC ACCESS RIGHT  04/13/2020   KNEE ARTHROSCOPY Bilateral     Social History:  reports that he has been smoking cigarettes. He has a 35.00 pack-year smoking history. He has been exposed to tobacco smoke. He has never used smokeless tobacco. He reports that he does not currently use drugs after having used the following drugs: Marijuana. He reports that he does not drink alcohol.   No Known Allergies  Family History  Problem Relation Age of Onset   Other Mother    Stroke Father  Hypertension Father    Epilepsy Father    Breast cancer Sister    Stroke Brother    Heart attack Brother    Diabetes Sister     Family history reviewed and not pertinent    Prior to Admission medications   Medication Sig Start Date End Date Taking? Authorizing Provider  acetaminophen (TYLENOL) 325 MG tablet Take 650 mg by mouth every 6 (six) hours as needed for moderate pain, fever or mild pain.   Yes [provider]  bictegravir-emtricitabine-tenofovir AF (BIKTARVY) 50-200-25 MG TABS tablet Take 1 tablet  by mouth daily. 05/01/20  Yes Oswald Hillock, MD  calcium acetate, Phos Binder, (PHOSLYRA) 667 MG/5ML SOLN Take 1,334 mg by mouth 3 (three) times daily with meals.   Yes [provider]  carvedilol (COREG) 12.5 MG tablet Take 12.5 mg by mouth in the morning and at bedtime.   Yes [provider]  cetirizine (ZYRTEC) 10 MG tablet Take 10 mg by mouth daily.   Yes [provider]  cloNIDine (CATAPRES) 0.1 MG tablet Take 1 tablet (0.1 mg total) by mouth 3 (three) times daily. 02/21/21  Yes Hosie Poisson, MD  clopidogrel (PLAVIX) 75 MG tablet Take 1 tablet (75 mg total) by mouth daily. 03/07/20  Yes Koberlein, Junell C, MD  docusate sodium (COLACE) 100 MG capsule Take 100 mg by mouth daily.   Yes [provider]  doxepin (SINEQUAN) 75 MG capsule Take 75 mg by mouth at bedtime.   Yes [provider]  epoetin alfa-epbx (RETACRIT) 22297 UNIT/ML injection Inject 20,000 Units into the skin every Monday.   Yes [provider]  Ergocalciferol 50 MCG (2000 UT) TABS Take 2,000 Units by mouth daily.   Yes [provider]  insulin glargine (LANTUS) 100 unit/mL SOPN Inject 5 Units into the skin at bedtime.   Yes [provider]  levETIRAcetam (KEPPRA) 500 MG tablet Take 500 mg by mouth in the morning and at bedtime.   Yes [provider]  Lidocaine 3 % CREA Apply 1 application topically See admin instructions. Apply 1 times a day on Tuesday, Thursday and Saturday prior to dialysis   Yes [provider]  Multiple Vitamin (MULTIVITAMIN WITH MINERALS) TABS tablet Take 1 tablet by mouth at bedtime.   Yes [provider]  pantoprazole (PROTONIX) 40 MG tablet TAKE 1 TABLET BY MOUTH EVERY DAY Patient taking differently: 40 mg daily. 01/23/20  Yes Koberlein, Junell C, MD  pravastatin (PRAVACHOL) 80 MG tablet Take 1 tablet (80 mg total) by mouth at bedtime. 03/07/20  Yes Koberlein, [name], MD  sildenafil (VIAGRA) 100 MG tablet Take  100 mg by mouth daily as needed for erectile dysfunction.   Yes [provider]  sulfamethoxazole-trimethoprim (BACTRIM DS) 800-160 MG tablet Take 1 tablet by mouth 3 (three) times a week. Patient taking differently: Take 1 tablet by mouth every Monday, Wednesday, and Friday. Once daily on M-W F continuous 04/23/20  Yes Little Ishikawa, MD  tamsulosin (FLOMAX) 0.4 MG CAPS capsule TAKE 1 CAPSULE BY MOUTH EVERY DAY Patient taking differently: Take 0.4 mg by mouth daily. 03/28/20  Yes Koberlein, [name], MD  traMADol (ULTRAM) 50 MG tablet Take 50 mg by mouth every 8 (eight) hours as needed for moderate pain. 05/01/21  Yes [provider]  triamcinolone cream (KENALOG) 0.1 % Apply 1 application topically 2 (two) times daily. To arms and legs   Yes [provider]  ACCU-CHEK AVIVA PLUS test strip USE 1  strip TO test blood sugar 2 TIMES DAILY TO 3 TIMES DAILY 06/10/19   [name], MD  blood glucose meter kit and supplies KIT Dispense based on patient and insurance preference. Check blood sugar 4 times a day 06/11/18   [name], MD  Cholecalciferol (VITAMIN D) 50 MCG (2000 UT) tablet Take 1 tablet (2,000 Units total) by mouth in the morning. Patient not taking: Reported on 10/19/2021 03/07/20   [name], MD  Darbepoetin Alfa (ARANESP) 60 MCG/0.3ML SOSY injection Inject 0.3 mLs (60 mcg total) into the vein every Saturday with hemodialysis. Patient not taking: Reported on 10/19/2021 01/19/21   Bonnielee Haff, MD  Insulin Pen Needle (SURE COMFORT PEN NEEDLES) 31G X 8 MM MISC Use as directed twice daily with Novolog flex pen 02/10/19   [name], MD  Misc. Devices (COMMODE 3-IN-1) MISC Use as directed 02/24/20   [name], MD  Misc. Devices (TRANSFER BENCH) MISC Use as directed 02/24/20   Koberlein, [name], MD  sildenafil (REVATIO) 20 MG tablet Take 1-5 tablets (20-100 mg total) by mouth daily as needed. Patient not taking: Reported on  10/19/2021 09/20/21   [name], MD  TRUEPLUS INSULIN SYRINGE 31G X 5/16" 1 ML MISC Use pen needle with insulin 2 times daily 03/31/19   [name], MD     Objective    Physical Exam: Vitals:   10/19/21 2030 10/19/21 2100 10/19/21 2115 10/19/21 2130  BP: (!) 234/102 (!) 212/94 (!) 225/99 (!) 222/103  Pulse: 81 83 87 88  Resp: '17 15 19 17  ' Temp:      TempSrc:      SpO2: 100% 98% 99% 97%  Weight:      Height:        General: appears to be stated age; alert, oriented Skin: warm, dry, no rash Head:  AT/[place] Mouth:  Oral mucosa membranes appear moist, normal dentition Neck: supple; trachea midline Heart:  RRR; did not appreciate any M/R/G Lungs: CTAB, did not appreciate any wheezes, rales, or rhonchi Abdomen: + BS; soft, ND, NT Vascular: 2+ pedal pulses b/l; 2+ radial pulses b/l Extremities: no peripheral edema, no muscle wasting Neuro: 5/5 strength of the proximal and distal flexors and extensors of the upper and lower extremities on left; 3/5 strength of RLE (baseline), 2/5 strength in RUE (baseline); sensation intact in upper and lower extremities b/l; cranial nerves II through XII grossly intact; no evidence suggestive of slurred speech, dysarthria, or facial droop; Normal muscle tone. No tremors.    Labs on Admission: I have personally reviewed following labs and imaging studies  CBC: Recent Labs  Lab 10/19/21 1300  WBC 6.9  NEUTROABS 4.1  HGB 11.6*  HCT 37.3*  MCV 101.9*  PLT 703   Basic Metabolic Panel: Recent Labs  Lab 10/19/21 1300  NA 137  K 4.7  CL 98  CO2 26  GLUCOSE 164*  BUN 37*  CREATININE 9.60*  CALCIUM 8.3*  MG 3.9*   GFR: Estimated Creatinine Clearance: 8.5 mL/min (A) (by C-G formula based on SCr of 9.6 mg/dL (H)). Liver Function Tests: Recent Labs  Lab 10/19/21 1300  AST 18  ALT 9  ALKPHOS 110  BILITOT 0.4  PROT 6.5  ALBUMIN 3.1*   No results for input(s): LIPASE, AMYLASE in the last 168 hours. No results for  input(s): AMMONIA in the last 168 hours. Coagulation Profile: No results for input(s): INR, PROTIME in the last 168 hours. Cardiac Enzymes: No  results for input(s): CKTOTAL, CKMB, CKMBINDEX, TROPONINI in the last 168 hours. BNP (last 3 results) No results for input(s): PROBNP in the last 8760 hours. HbA1C: No results for input(s): HGBA1C in the last 72 hours. CBG: No results for input(s): GLUCAP in the last 168 hours. Lipid Profile: No results for input(s): CHOL, HDL, LDLCALC, TRIG, CHOLHDL, LDLDIRECT in the last 72 hours. Thyroid Function Tests: No results for input(s): TSH, T4TOTAL, FREET4, T3FREE, THYROIDAB in the last 72 hours. Anemia Panel: No results for input(s): VITAMINB12, FOLATE, FERRITIN, TIBC, IRON, RETICCTPCT in the last 72 hours. Urine analysis:    Component Value Date/Time   COLORURINE STRAW (A) 04/11/2020 0223   APPEARANCEUR CLEAR 04/11/2020 0223   LABSPEC 1.011 04/11/2020 0223   PHURINE 5.0 04/11/2020 0223   GLUCOSEU 50 (A) 04/11/2020 0223   HGBUR SMALL (A) 04/11/2020 0223   BILIRUBINUR NEGATIVE 04/11/2020 0223   KETONESUR NEGATIVE 04/11/2020 0223   PROTEINUR >=300 (A) 04/11/2020 0223   UROBILINOGEN 0.2 11/29/2014 2003   NITRITE NEGATIVE 04/11/2020 0223   LEUKOCYTESUR NEGATIVE 04/11/2020 0223    Radiological Exams on Admission: CT HEAD WO CONTRAST  Result Date: 10/19/2021 CLINICAL DATA:  Trauma, patient on anticoagulation regimen. EXAM: CT HEAD WITHOUT CONTRAST TECHNIQUE: Contiguous axial images were obtained from the base of the skull through the vertex without intravenous contrast. RADIATION DOSE REDUCTION: This exam was performed according to the departmental dose-optimization program which includes automated exposure control, adjustment of the mA and/or kV according to patient size and/or use of iterative reconstruction technique. COMPARISON:  08/09/2020 FINDINGS: Brain: No acute intracranial findings are seen. Cortical sulci are prominent. There is decreased  density in the periventricular and subcortical white matter. There are small old lacunar infarcts in the left basal ganglia and left periventricular region. Vascular: There are scattered arterial calcifications. Skull: No fracture is seen. Sinuses/Orbits: There is mucosal thickening in the frontal and ethmoid sinuses. Other: None IMPRESSION: No acute intracranial findings are seen in noncontrast CT brain. Atrophy. Small-vessel disease. Old lacunar infarcts are seen in the left basal ganglia and periventricular region. No significant interval changes are noted since 08/09/2020. There is fluid density in right side of frontal sinus and right ethmoid sinus, possibly sinusitis. Electronically Signed   By: Elmer Picker M.D.   On: 10/19/2021 13:36   DG Pelvis Portable  Result Date: 10/19/2021 CLINICAL DATA:  Fall. EXAM: PORTABLE PELVIS 1-2 VIEWS COMPARISON:  December 21, 2020. FINDINGS: There is no evidence of pelvic fracture or diastasis. No pelvic bone lesions are seen. IMPRESSION: Negative. Electronically Signed   By: Marijo Conception M.D.   On: 10/19/2021 13:12   DG Chest Port 1 View  Result Date: 10/19/2021 CLINICAL DATA:  Fall. EXAM: PORTABLE CHEST 1 VIEW COMPARISON:  June 25, 2021. FINDINGS: Stable cardiomediastinal silhouette. Both lungs are clear. The visualized skeletal structures are unremarkable. IMPRESSION: No active disease. Electronically Signed   By: Marijo Conception M.D.   On: 10/19/2021 13:11   EEG adult  Result Date: 10/19/2021 Lora Havens, MD     10/19/2021  5:01 PM Patient Name: Zayvier Caravello MRN: [id number] Epilepsy Attending: Lora Havens Referring Physician/Provider: Karmen Bongo, MD Date: 10/19/2021 Duration: 22.26 mins Patient history: 66yo m with h/o seizures who presented after fall. EEG to evaluate for seizure. Level of alertness: Awake, drowsy AEDs during EEG study: LEV Technical aspects: This EEG study was done with scalp electrodes positioned according to  the 10-20 International system of electrode placement. Electrical activity was  acquired at a sampling rate of '500Hz'  and reviewed with a high frequency filter of '70Hz'  and a low frequency filter of '1Hz' . EEG data were recorded continuously and digitally stored. Description: The posterior dominant rhythm consists of 7.5 Hz activity of moderate voltage (25-35 uV) seen predominantly in posterior head regions, symmetric and reactive to eye opening and eye closing. Drowsiness was characterized by attenuation of the posterior background. rhythm.Hyperventilation and photic stimulation were not performed.   IMPRESSION: This study is within normal limits. No seizures or epileptiform discharges were seen throughout the recording. Priyanka Barbra Sarks     EKG: Independently reviewed, with result as described above.    Assessment/Plan   Principal Problem:   Hypertensive crisis Active Problems:   Tobacco abuse   Hyperlipidemia   DM2 (diabetes mellitus, type 2) ([place])   History of cerebrovascular accident (CVA) with residual deficit   Seizure ([place])   ESRD on hemodialysis ([place])   Hypermagnesemia   Prolonged QT interval   Allergic rhinitis     #) Hypertensive crisis: On the basis of presenting blood pressures greater than 200 mmHg, with maximum blood pressure noted to be 228/99, associated with MAP of 142 mmHg, in the context of presenting syncope, with blood pressures persisting to these levels in spite of resumption of home and hypertensive regimen as well as single dose of IV hydralazine.  Otherwise, does not appear to be associated any acute focal neurologic deficits, and CT head showed no evidence of acute intracranial process.  Aside from aforementioned single episode of syncope leading to presentation in the ED, patient has otherwise remained completely asymptomatic.  Within the confines of incisional disease on hemodialysis and limited utility in monitoring ensuing renal function directly, laboratory review  demonstrates no overt evidence of endorgan damage, including evidence of transaminitis.   Etiology leading to hypertensive crisis presentation: Not entirely clear at this time, as the patient reports outstanding compliance with his antihypertensive regimen, without any associated recent dose modifications.  Differential includes acute schema CVA given a documented history of such in 2013, with residual right hemiparesis and presenting syncope.  Case discussed with on-call neurology, who did not feel that MRI brain was warranted at this time, following aforementioned CT head showed no evidence of acute intracranial process.  No associated chest pain, EKG shows notes of acute ischemic changes.  An uric at baseline in setting of end-stage renal disease, and therefore unable to assess for any contribution recreational drug use via UDS.    In terms of short-term blood pressures goals relating to this presentation: a 10-20% reduction in systolic blood pressure or mean arterial pressure over the first hour following presentation, with subsequent short term blood pressure goals include a reduction by an additional 5-15% over the next 24 hours resulting in a total reduction by 15-35% over the period, which would coincide with goal systolic blood pressure of between 148 - 194 mmHg and MAP between 92-120 mmHg.   Additionally, the patient reports that he has rarely undergone scheduled Saturday hemodialysis sessions over the last few years. However, may need to discuss case with nephrology in the AM for the potential of expediting next HD session (currently scheduled for Tuesday, 10/22/2021) if unable to achieve goal blood pressure in the setting of presenting hypertensive crisis with existing medical management plan as described here.     Plan: Will closely monitor ensuing BP's, with goal BP's as outlined above. Will continue to monitor for laboratory evidence of end organ damage with repeat CMP  in the morning. Check  TSH and UDS. Monitor strict I's and O's.  Monitor on telemetry.  Nitroglycerin drip with the following parameters: goal systolic blood pressure 130-865  mmHg. Continue outpatient antihypertensive regimen, and resume home Flomax for additional of alpha 1 antagonistic benefits.  Additionally, will resume home prn tramadol did not have any hypertensive influence from pain. also counseled patient on importance of smoking discontinuation, as further described below.  Every 4 hours neurochecks ordered x4 occurrences.  Consider discussing with nephrology, depending upon interval trend in blood pressure, as further detailed above.        #) Syncope: 1 episode of syncope w/o prodrome that occurred while patient remained seated in wheelchair smoking cigarette.  In the absence of any prodrome, orthostatic hypotension versus vasovagal syncope appear less likely.  In this context, differential also includes ventricular arrhythmia, although no such rhythm identified thus far telemetry monitoring over the course of the last 10 to 12 hours in the ED. presentation appears less consistent with ACS in the absence of any recent chest pain, and with presenting EKG showing no evidence of acute ischemic changes.  EEG monitoring in the setting of a documented history of seizures was found to be normal, and CT head showed no evidence of acute process, with associated neurology consultation leading to no associated recommendation for MRI brain to further evaluate any potential underlying contribution from acute ischemic CVA given that presentation is not associated with any acute focal neurologic deficits.  Differential also includes autonomic dysfunction in the setting of known underlying diabetes.  Of note, will refrain from aggressive IV fluids in the setting of end-stage renal disease, with the patient having skipped his scheduled HD session earlier today.     Plan: Monitor on telemetry.   Monitor strict I's and O's.  Recheck  serum Mg level in the a.m. Check CMP, CBC, serum Mg level in the AM. Fall precautions ordered.  Echo ordered for the a.m.  Bilateral carotid ultrasound in the morning.  Every 4 hours neurochecks, as above.        #) Hypermagnesemia: Presenting serum magnesium level found to be elevated at 3.9.  No clinical evidence to suggest rhabdomyolysis as a contributory factor leading to elevated serum magnesium level from muscle breakdown.  Does not appear to be on any magnesium supplementation as outpatient.  Patient appears asymptomatic from the standpoint of hypomagnesemia, including no associated generalized weakness.  Additionally, he is not demonstrating any evidence of associated bradycardia or hypotension.  Rather, presentation has been associated with hypertensive crisis, with the possibility of hypermagnesemia offering some hemodynamic benefit relative to what the patient's hypertensive readings may have been in the absence of an elevated serum magnesium level.  On the basis of this train of thought, will refrain from aggressive reversal of patient's elevated serum magnesium level by refraining from administration of calcium gluconate at this time.  Rather, we will plan for repeat serum magnesium level in the morning, while closely monitoring ensuing vital signs, including heart rate for ensuing development of bradycardia. Will also check TSH to further evaluate.   Plan: Repeat serum magnesium level in the morning.  Repeat CMP in the morning.  Check TSH.  Monitor on telemetry.  Check ionized calcium level.        #) QTC prolongation: Presenting EKG reflects QTC of 514 ms.  Outpatient medications notable for the TCA antidepressant doxepin.  Serum magnesium level as noted above.  Plan: Hold home doxepin for now.  I also  ordered a repeat EKG for the morning to evaluate ensuing QTc interval.  Monitor oximetry.         #) ESRD: on HD.  Officially, patient's hemodialysis schedule is Tuesday,  Thursday, Saturday schedule, with the patient reports that he routinely skips his Saturday session, and that his effective hemodialysis schedule over the last few years has been on a Tuesday/Thursday basis.  We will closely monitor ensuing blood pressure, including in response to medical management plan as above to determine necessity discussing case with nephrology for HD prior to discharge. Of note, no evidence of hyperkalemia, metabolic acidosis, acute volume overload, or uremia.   Plan: monitor strict I's/O's, daily weights. CMP in the AM. Check mag and phos levels.  Close monitoring of ensuing blood pressure, including further evaluation and management of presenting hypertensive crisis, as above.         #) Type 2 Diabetes Mellitus: documented history of such. Home insulin regimen: Lantus 5 units subcu nightly, in the absence of any short acting insulin. Home oral hypoglycemic agents: None. presenting blood sugar: 164.   Plan: accuchecks QAC and HS with very low dose SSI.  Hold home basal insulin in the hospital for now.       #) GERD: documented h/o such; on Protonix as outpatient.   Plan: continue home PPI.          #) Seizure disorder: Document history of such, on Keppra as an outpatient.  No overt evidence of seizure-like activity, including EEG monitoring in the ED today which also demonstrated no evidence of subclinical seizures.   Plan: Resume home Keppra.        #) HIV: On Biktarvy as an outpatient.  Plan: Continue home [place] .          #) Hyperlipidemia: documented h/o such. On pravastatin as outpatient.    Plan: continue home statin.           #) Allergic Rhinitis: documented h/o such, on scheduled loratadine as outpatient, in the absence of any intranasal corticosteroid.  In the context of presenting hypertensive crisis, will hold home loratadine to eliminate any anticholinergic contribution towards hypertension stemming from this  medication   Plan: Hold home loratadine for now, as above        #) Chronic tobacco abuse: Patient conveys that they are a current smoker, having smoked 0.5-1.0 ppd for at least the last 35 years years, while noting that more recently, he is down to half pack per day  Plan: Counseled the patient on the importance of complete smoking discontinuation.  Order placed for prn nicotine patch for use during this hospitalization.       DVT prophylaxis: SCD's   Code Status: Full code Family Communication: none Disposition Plan: Per Rounding Team Consults called: Case was discussed with the on-call neurologist, Dr.Yadav, As further detailed above;  Admission status: Observation; SDU   PLEASE NOTE THAT DRAGON DICTATION SOFTWARE WAS USED IN THE CONSTRUCTION OF THIS NOTE.   [place] DO [facility]  From [place]   10/19/2021, 9:39 PM

## 2021-10-19 NOTE — Assessment & Plan Note (Deleted)
-  Continue Pravachol ?

## 2021-10-19 NOTE — Procedures (Addendum)
Patient Name: Richard [name]  MRN: [id number]  Epilepsy Attending: Lora Havens  Referring Physician/Provider: Karmen Bongo, MD Date: 10/19/2021 Duration: 22.26 mins  Patient history: 65yo m with h/o seizures who presented after fall. EEG to evaluate for seizure.   Level of alertness: Awake, drowsy  AEDs during EEG study: LEV  Technical aspects: This EEG study was done with scalp electrodes positioned according to the 10-20 International system of electrode placement. Electrical activity was acquired at a sampling rate of 500Hz  and reviewed with a high frequency filter of 70Hz  and a low frequency filter of 1Hz . EEG data were recorded continuously and digitally stored.   Description: The posterior dominant rhythm consists of 7.5 Hz activity of moderate voltage (25-35 uV) seen predominantly in posterior head regions, symmetric and reactive to eye opening and eye closing. Drowsiness was characterized by attenuation of the posterior background. rhythm.Hyperventilation and photic stimulation were not performed.     IMPRESSION: This study is within normal limits. No seizures or epileptiform discharges were seen throughout the recording.  Renaye Janicki Barbra Sarks

## 2021-10-19 NOTE — Assessment & Plan Note (Signed)
-  Uncontrolled HTN -Reports no home meds today - and also missed HD -Will give Clonidine and carvedilol now -Assuming BP improves, likely ok for return to SNF this PM

## 2021-10-19 NOTE — ED Notes (Signed)
EEG at bedside.

## 2021-10-19 NOTE — Progress Notes (Signed)
Orthopedic Tech Progress Note Patient Details:  Richard [name] 1956/03/19 [id number] Level 2 Trauma  Patient ID: [name], male   DOB: 1955/10/06, 66 y.o.   MRN: [id number]  Jearld Lesch 10/19/2021, 1:34 PM

## 2021-10-19 NOTE — Assessment & Plan Note (Signed)
-  Still with detectable viral load and immunosuppression by last CD4 -Continue Biktarvy -Outpatient f/u with ID

## 2021-10-19 NOTE — Assessment & Plan Note (Deleted)
-  Smoking cessation should be encouraged on an ongoing basis

## 2021-10-19 NOTE — Assessment & Plan Note (Deleted)
-  Last A1c was 5.6, indicating good control -Continue nocturnal glargine (low dose)

## 2021-10-19 NOTE — ED Notes (Signed)
IV team consult placed.

## 2021-10-19 NOTE — ED Notes (Signed)
Pt's blood pressure improved on Nitro drip infusing at 15 mcg/min and is within goal parameters of SBP 160-190; Pt's BP is 181/94.

## 2021-10-19 NOTE — Consult Note (Signed)
ER Consult Note    Patient: Richard [name] FUX:[id number] DOB: 1955/11/11 DOA: 10/19/2021 DOS: the patient was seen and examined on 10/19/2021 PCP: [name], MD  Patient coming from: SNF, [facility], has been there for a year; NOK: Daughter, [name], [phone]   Chief Complaint: Fall  HPI: Richard [name] is a 66 y.o. male with medical history significant of DM; ESRD on HD; HTN; CVA; chronic diastolic CHF; and AIDS with h/o meningoencephalitis presenting with a fall.   He doesn't know what happened.  He was sitting in his wheelchair and that is the last thing he remembers.  He doesn't remember feeling light-headed or dizzy.  The facility reported that he fell out of the wheelchair.  He is not sure whether he lost consciousness.  His morning was good.  He ate without difficulty.  He is always in a wheelchair and cannot stand unsupported.  It would be unusual for him to get out of the wheelchair on his own.  He doesn't know if someone nudged the wheelchair.  He doesn't remember falling.  He was sitting in a wheelchair smoking a cigarette and the next thing he remembers he was in an ambulance heading to the hospital.  He does not think he was unconscious when EMS arrived.  He has a slight headache and otherwise feels well.  He feels like he came to while EMS was at the facility.  No other symptoms.  He does have seizures.  His last seizure was >1 year ago.  He did not have urinary or fecal incontinence or bite his tongue today.  No N/W/T.  The patient reports that he did not get his AM medications and that he was planning to go to HD but didn't make it because of this event.  However, he told the nursing staff that he opted out of HD today.    ER Course:  High risk syncope, unclear story.  H/o seizures, HD patient, h/o AIDS.  Decided not to go to HD, found down.  Unsure why.  Hit head, on Plavix.  Was in wheelchair prior to fall.  No complaints.  EKG unchanged.   Labs ok.  Discussed with Dr. Jonnie Finner about elevated Mag++.  ?need to stay.  Not post-ictal/confused with EMS.      Review of Systems: As mentioned in the history of present illness. All other systems reviewed and are negative. Past Medical History:  Diagnosis Date   COVID-19 05/16/2020   Diabetes mellitus, type II, insulin dependent ([place])    ESRD on hemodialysis ([place]) 08/30/2021   GERD (gastroesophageal reflux disease)    HIV (human immunodeficiency virus infection) ([place])    Hypertension    Meningoencephalitis    Neuropathy in diabetes ([place])    bilat feet   Ruptured lumbar disc    Seizure disorder ([place])    Stroke ([place]) 2013   residual right sided weakness (arm>leg)   Past Surgical History:  Procedure Laterality Date   AV FISTULA PLACEMENT Left 04/25/2020   Procedure: LEFT ARM BRACHIOCEPHALIC ARTERIOVENOUS (AV) FISTULA CREATION;  Surgeon: Rosetta Posner, MD;  Location: [facility] OR;  Service: Vascular;  Laterality: Left;   IR FLUORO GUIDE CV LINE RIGHT  04/13/2020   IR REMOVAL TUN CV CATH W/O FL  01/14/2021   IR US GUIDE VASC ACCESS RIGHT  04/13/2020   KNEE ARTHROSCOPY Bilateral    Social History:  reports that he has been smoking cigarettes. He has a 35.00 pack-year smoking history. He has  been exposed to tobacco smoke. He has never used smokeless tobacco. He reports that he does not currently use drugs after having used the following drugs: Marijuana. He reports that he does not drink alcohol.  No Active Allergies  Family History  Problem Relation Age of Onset   Other Mother    Stroke Father    Hypertension Father    Epilepsy Father    Breast cancer Sister    Stroke Brother    Heart attack Brother    Diabetes Sister     Prior to Admission medications   Medication Sig Start Date End Date Taking? Authorizing Provider  ACCU-CHEK AVIVA PLUS test strip USE 1 strip TO test blood sugar 2 TIMES DAILY TO 3 TIMES DAILY 06/10/19   [name], MD  acetaminophen (TYLENOL) 325 MG  tablet Take 650 mg by mouth every 6 (six) hours as needed for moderate pain, fever or mild pain.    [provider]  bictegravir-emtricitabine-tenofovir AF (BIKTARVY) 50-200-25 MG TABS tablet Take 1 tablet by mouth daily. 05/01/20   Oswald Hillock, MD  blood glucose meter kit and supplies KIT Dispense based on patient and insurance preference. Check blood sugar 4 times a day 06/11/18   [name], MD  carvedilol (COREG) 12.5 MG tablet Take 12.5 mg by mouth in the morning and at bedtime.    [provider]  cetirizine (ZYRTEC) 10 MG tablet Take 10 mg by mouth daily.    [provider]  Cholecalciferol (VITAMIN D) 50 MCG (2000 UT) tablet Take 1 tablet (2,000 Units total) by mouth in the morning. 03/07/20   [name], MD  cloNIDine (CATAPRES) 0.1 MG tablet Take 1 tablet (0.1 mg total) by mouth 3 (three) times daily. 02/21/21   Hosie Poisson, MD  clopidogrel (PLAVIX) 75 MG tablet Take 1 tablet (75 mg total) by mouth daily. 03/07/20   [name], MD  Darbepoetin Alfa (ARANESP) 60 MCG/0.3ML SOSY injection Inject 0.3 mLs (60 mcg total) into the vein every Saturday with hemodialysis. 01/19/21   Bonnielee Haff, MD  doxepin (SINEQUAN) 75 MG capsule Take 75 mg by mouth at bedtime.    [provider]  EMOLLIENT EX Apply 1 application topically See admin instructions. Apply emollient lotion topically to arms and legs  one time daily for dry skin    [provider]  epoetin alfa-epbx (RETACRIT) 67124 UNIT/ML injection 20,000 Units every Monday. Intramuscularly (for ESRD)    [provider]  insulin glargine (LANTUS) 100 unit/mL SOPN Inject 5 Units into the skin at bedtime.    [provider]  Insulin Pen Needle (SURE COMFORT PEN NEEDLES) 31G X 8 MM MISC Use as directed twice daily with Novolog flex pen 02/10/19   Koberlein, [name], MD  levETIRAcetam (KEPPRA) 500 MG tablet Take 500 mg by mouth in the morning and at bedtime.     [provider]  Misc. Devices (COMMODE 3-IN-1) MISC Use as directed 02/24/20   [name], MD  Misc. Devices (TRANSFER BENCH) MISC Use as directed 02/24/20   [name], MD  Multiple Vitamin (MULTIVITAMIN WITH MINERALS) TABS tablet Take 1 tablet by mouth at bedtime.    [provider]  Nutritional Supplements (FEEDING SUPPLEMENT, NEPRO CARB STEADY,) LIQD Take 237 mLs by mouth 3 (three) times daily before meals.    [provider]  ondansetron (ZOFRAN) 4 MG tablet Take 4 mg by mouth every 4 (four) hours as needed for nausea/vomiting. 12/19/20  [provider]  pantoprazole (PROTONIX) 40 MG tablet TAKE 1 TABLET BY MOUTH EVERY DAY 01/23/20   Koberlein, [name], MD  pravastatin (PRAVACHOL) 80 MG tablet Take 1 tablet (80 mg total) by mouth at bedtime. 03/07/20   [name], MD  sildenafil (REVATIO) 20 MG tablet Take 1-5 tablets (20-100 mg total) by mouth daily as needed. 09/20/21   [name], MD  sulfamethoxazole-trimethoprim (BACTRIM DS) 800-160 MG tablet Take 1 tablet by mouth 3 (three) times a week. 04/23/20   Little Ishikawa, MD  tamsulosin (FLOMAX) 0.4 MG CAPS capsule TAKE 1 CAPSULE BY MOUTH EVERY DAY 03/28/20   Koberlein, [name], MD  traMADol (ULTRAM) 50 MG tablet Take 50 mg by mouth every 8 (eight) hours as needed. 05/01/21   [provider]  triamcinolone cream (KENALOG) 0.1 % Apply 1 application topically 2 (two) times daily.    [provider]  TRUEPLUS INSULIN SYRINGE 31G X 5/16" 1 ML MISC Use pen needle with insulin 2 times daily 03/31/19   [name], MD    Physical Exam: Vitals:   10/19/21 1445 10/19/21 1500 10/19/21 1515 10/19/21 1530  BP: (!) 198/99 (!) 211/98 (!) 219/99 (!) 207/97  Pulse: 78 73 76 81  Resp: 14 11 (!) 22 18  Temp:      TempSrc:      SpO2: 100% 98% 100% 100%  Weight:      Height:       General:  Appears calm and comfortable and is in NAD Eyes:  PERRL, EOMI, normal  lids, iris ENT:  grossly normal hearing, lips & tongue, mmm; suboptimal dentition Neck:  no LAD, masses or thyromegaly Cardiovascular:  RRR, no m/r/g. No LE edema.  Respiratory:   CTA bilaterally with no wheezes/rales/rhonchi.  Normal respiratory effort. Abdomen:  soft, NT, ND Skin:  no rash or induration seen on limited exam Musculoskeletal:  chronic R hemiparesis, R hand contractures Psychiatric:  grossly normal mood and affect, speech fluent and appropriate, AOx3 Neurologic:  CN 2-12 grossly intact, moves all extremities in coordinated fashion   Radiological Exams on Admission: Independently reviewed - see discussion in A/P where applicable  CT HEAD WO CONTRAST  Result Date: 10/19/2021 CLINICAL DATA:  Trauma, patient on anticoagulation regimen. EXAM: CT HEAD WITHOUT CONTRAST TECHNIQUE: Contiguous axial images were obtained from the base of the skull through the vertex without intravenous contrast. RADIATION DOSE REDUCTION: This exam was performed according to the departmental dose-optimization program which includes automated exposure control, adjustment of the mA and/or kV according to patient size and/or use of iterative reconstruction technique. COMPARISON:  08/09/2020 FINDINGS: Brain: No acute intracranial findings are seen. Cortical sulci are prominent. There is decreased density in the periventricular and subcortical white matter. There are small old lacunar infarcts in the left basal ganglia and left periventricular region. Vascular: There are scattered arterial calcifications. Skull: No fracture is seen. Sinuses/Orbits: There is mucosal thickening in the frontal and ethmoid sinuses. Other: None IMPRESSION: No acute intracranial findings are seen in noncontrast CT brain. Atrophy. Small-vessel disease. Old lacunar infarcts are seen in the left basal ganglia and periventricular region. No significant interval changes are noted since 08/09/2020. There is fluid density in right side of frontal  sinus and right ethmoid sinus, possibly sinusitis. Electronically Signed   By: Elmer Picker M.D.   On: 10/19/2021 13:36   DG Pelvis Portable  Result Date: 10/19/2021 CLINICAL DATA:  Fall. EXAM: PORTABLE PELVIS 1-2 VIEWS COMPARISON:  December 21, 2020. FINDINGS: There is no evidence of pelvic fracture or diastasis. No pelvic bone lesions are seen. IMPRESSION: Negative. Electronically Signed   By: Marijo Conception M.D.   On: 10/19/2021 13:12   DG Chest Port 1 View  Result Date: 10/19/2021 CLINICAL DATA:  Fall. EXAM: PORTABLE CHEST 1 VIEW COMPARISON:  June 25, 2021. FINDINGS: Stable cardiomediastinal silhouette. Both lungs are clear. The visualized skeletal structures are unremarkable. IMPRESSION: No active disease. Electronically Signed   By: Marijo Conception M.D.   On: 10/19/2021 13:11    EKG: Independently reviewed.   1247 - NSR with rate 82; nonspecific ST changes with no evidence of acute ischemia 1447 - NSR with rate 74; prolonged QTx 514;  nonspecific ST changes with no evidence of acute ischemia   Labs on Admission: I have personally reviewed the available labs and imaging studies at the time of the admission.  Pertinent labs:    Glucose 164 BN 37/Creatinine 9.60/GFR 6 Mag++ 3.9 Albumin 3.1 WBC 6.9 Hgb 11.6 HIV quant 60 on 11/7 CD4 388 on 11/7   Assessment/Plan * Fall from non-moving wheelchair- (present on admission) -Patient with "fall" from wheelchair -It is unclear exactly what happened - syncope vs. An accidental wheelchair nudge vs. Patient falling asleep and falling into the floor vs. Other -He denies symptoms prior to event or after, other than headache from hitting his head in fall -CT head unremarkable -As such, we discussed options to include overnight observation and dc back to SNF -Based on discussion, will order EEG and give HTN medications -If unremarkable EEG, BP improves, and patient continues to feel well, he should be appropriate for dc back to SNF this  afternoon -Will hold admission at this time -Patient and EDP are in agreement with this plan  ESRD on hemodialysis [facility]) -Patient on chronic TTS HD -He does not appear to be volume overloaded or otherwise in need of acute HD -Patient reports HD compliance and plan to attend this AM; however, charting and nursing report imply otherwise -Regardless, he appears stable at this time and whether he comes in to the hospital for obs or not, the likely plan will be HD Monday until his circumstances change -As such, this issue should not impact the decision to discharge or not -Nephrology is in agreement with plan to d/c  AIDS (acquired immune deficiency syndrome) ([place])- (present on admission) -Still with detectable viral load and immunosuppression by last CD4 -Continue Biktarvy -Outpatient f/u with ID  Seizure ([place]) -h/o seizure d/o -Reports no seizure since admission to [place] about a year ago -EEG pending -Continue Keppra -If negative EEG, likely back to SNF today  History of cerebrovascular accident (CVA) with residual deficit -Significant R-sided deficits at baseline, appears to be stable -Continue Plavix  DM2 (diabetes mellitus, type 2) ([facility]) -Last A1c was 5.6, indicating good control -Continue nocturnal glargine (low dose)  Hypertension- (present on admission) -Uncontrolled HTN -Reports no home meds today - and also missed HD -Will give Clonidine and carvedilol now -Assuming BP improves, likely ok for return to SNF this PM  Hyperlipidemia- (present on admission) -Continue Pravachol  Tobacco abuse- (present on admission) -Smoking cessation should be encouraged on an ongoing basis    Advance Care Planning: Full Code  Consults: Nephrology by Secure Chat only  Family Communication: None present  Thank you for this interesting consult.  Anticipate d/c back to SNF today.  Author: Karmen Bongo, MD 10/19/2021 3:52 PM  For on call review [email or web address].

## 2021-10-19 NOTE — ED Notes (Signed)
Patient transported to CT 

## 2021-10-19 NOTE — ED Provider Notes (Signed)
Signout received on this 66 year old male who presented from [place] for evaluation of syncopal episode.  Patient does have history of HIV/AIDS, CVA, HFpEF, end-stage renal disease on dialysis.  Patient does not recall events around the episode.  He is unsure if he lost consciousness.  Patient remembers being out in a parking lot smoking when a He remembers is being transported to the emergency room in the ambulance.  At signout patient was discussed with hospitalist for plan of admission versus discharge.  Patient discussed with hospitalist by signout provider and plan is to order his home medication and monitor blood pressure and obtain EEG given history of seizures.  If blood pressure improves and EEG is negative patient can potentially be discharged. Physical Exam  BP (!) 219/99    Pulse 76    Temp 98.1 F (36.7 C) (Oral)    Resp (!) 22    Ht 6' (1.829 m)    Wt 77.1 kg    SpO2 100%    BMI 23.06 kg/m     Procedures  Procedures  ED Course / MDM    Medical Decision Making Amount and/or Complexity of Data Reviewed Labs: ordered. Radiology: ordered.  Risk Prescription drug management.   Patient's EEG is normal.  Unable to control patient's blood pressure.  We will call hospitalist to discuss admission.   Discussed with hospitalist who will evaluate patient for admission.      Evlyn Courier, PA-C 10/19/21 2035    Drenda Freeze, MD 10/19/21 [phone]

## 2021-10-19 NOTE — Assessment & Plan Note (Deleted)
-  h/o seizure d/o -Reports no seizure since admission to [place] about a year ago -EEG pending -Continue Keppra -If negative EEG, likely back to SNF today

## 2021-10-19 NOTE — Assessment & Plan Note (Deleted)
-  Patient on chronic TTS HD -He does not appear to be volume overloaded or otherwise in need of acute HD -Patient reports HD compliance and plan to attend this AM; however, charting and nursing report imply otherwise -Regardless, he appears stable at this time and whether he comes in to the hospital for obs or not, the likely plan will be HD Monday until his circumstances change -As such, this issue should not impact the decision to discharge or not -Nephrology is in agreement with plan to d/c

## 2021-10-19 NOTE — ED Notes (Signed)
Pt given sandwich and drink. Pt is comfortable.

## 2021-10-19 NOTE — Assessment & Plan Note (Deleted)
-  Significant R-sided deficits at baseline, appears to be stable -Continue Plavix

## 2021-10-19 NOTE — ED Notes (Signed)
Hematoma to Right brow/forehead, no abrasion noted.

## 2021-10-19 NOTE — Progress Notes (Signed)
IVT consulted for CVC/PICC assessment.  IVT called and spoke with RN Arby Barrette who states incorrect consult and needs a PIV placement.  Will place new consult for difficult IV placement. CVC consult cleared.

## 2021-10-19 NOTE — ED Provider Notes (Signed)
[facility] EMERGENCY DEPARTMENT Provider Note   CSN: [id number] Arrival date & time: 10/19/21  1239     History  Chief Complaint  Patient presents with   Fall   Level 2 - On thinners    Richard [name] is a 66 y.o. male.  Patient from [facility], history of previous CVA with right-sided hemiparesis, ambulates in wheelchair, history of HIV/AIDS on antivirals with most recent CD4 count in 300s 08/2021, history of seizures, hypertension, on chronic Plavix, ESRD on dialysis (Tuesday/Thursday/Saturday), Echo with grade 2 diastolic dysfunction 03/140 -- presents to the emergency department after a fall.  Patient states that he was in his usual state of health this morning.  He was not planning on going to dialysis today "to let my arm rest".  He states his last session was 2 days ago.  Patient was outside in a wheelchair smoking.  At some point he had fallen from his wheelchair.  Patient has no memory of falling.  He does not report any prodrome.  He woke on the ground, being assisted.  He denies associated headache, vision changes, worsening weakness.  He denies chest pain or shortness of breath accompanying this episode.  No neck pain.  Patient was found with abrasions to his left hand and with a right sided forehead hematoma.  He does not remember the injury.  EMS was called for transport.        Home Medications Prior to Admission medications   Medication Sig Start Date End Date Taking? Authorizing Provider  ACCU-CHEK AVIVA PLUS test strip USE 1 strip TO test blood sugar 2 TIMES DAILY TO 3 TIMES DAILY 06/10/19   [name], MD  acetaminophen (TYLENOL) 325 MG tablet Take 650 mg by mouth every 6 (six) hours as needed for moderate pain, fever or mild pain.    [provider]  bictegravir-emtricitabine-tenofovir AF (BIKTARVY) 50-200-25 MG TABS tablet Take 1 tablet by mouth daily. 05/01/20   Oswald Hillock, MD  blood glucose meter kit and supplies KIT  Dispense based on patient and insurance preference. Check blood sugar 4 times a day 06/11/18   [name], MD  carvedilol (COREG) 12.5 MG tablet Take 12.5 mg by mouth in the morning and at bedtime.    [provider]  cetirizine (ZYRTEC) 10 MG tablet Take 10 mg by mouth daily.    [provider]  Cholecalciferol (VITAMIN D) 50 MCG (2000 UT) tablet Take 1 tablet (2,000 Units total) by mouth in the morning. 03/07/20   [name], MD  cloNIDine (CATAPRES) 0.1 MG tablet Take 1 tablet (0.1 mg total) by mouth 3 (three) times daily. 02/21/21   Hosie Poisson, MD  clopidogrel (PLAVIX) 75 MG tablet Take 1 tablet (75 mg total) by mouth daily. 03/07/20   [name], MD  Darbepoetin Alfa (ARANESP) 60 MCG/0.3ML SOSY injection Inject 0.3 mLs (60 mcg total) into the vein every Saturday with hemodialysis. 01/19/21   Bonnielee Haff, MD  doxepin (SINEQUAN) 75 MG capsule Take 75 mg by mouth at bedtime.    [provider]  EMOLLIENT EX Apply 1 application topically See admin instructions. Apply emollient lotion topically to arms and legs  one time daily for dry skin    [provider]  epoetin alfa-epbx (RETACRIT) 03013 UNIT/ML injection 20,000 Units every Monday. Intramuscularly (for ESRD)    [provider]  insulin glargine (LANTUS) 100 unit/mL SOPN Inject 5 Units into the skin at bedtime.  [provider]  Insulin Pen Needle (SURE COMFORT PEN NEEDLES) 31G X 8 MM MISC Use as directed twice daily with Novolog flex pen 02/10/19   Koberlein, [name], MD  levETIRAcetam (KEPPRA) 500 MG tablet Take 500 mg by mouth in the morning and at bedtime.    [provider]  Misc. Devices (COMMODE 3-IN-1) MISC Use as directed 02/24/20   [name], MD  Misc. Devices (TRANSFER BENCH) MISC Use as directed 02/24/20   [name], MD  Multiple Vitamin (MULTIVITAMIN WITH MINERALS) TABS tablet Take 1 tablet by mouth at bedtime.    [provider]  Nutritional Supplements (FEEDING SUPPLEMENT, NEPRO CARB STEADY,) LIQD Take 237 mLs by mouth 3 (three) times daily before meals.    [provider]  ondansetron (ZOFRAN) 4 MG tablet Take 4 mg by mouth every 4 (four) hours as needed for nausea/vomiting. 12/19/20   [provider]  pantoprazole (PROTONIX) 40 MG tablet TAKE 1 TABLET BY MOUTH EVERY DAY 01/23/20   Koberlein, [name], MD  pravastatin (PRAVACHOL) 80 MG tablet Take 1 tablet (80 mg total) by mouth at bedtime. 03/07/20   [name], MD  sildenafil (REVATIO) 20 MG tablet Take 1-5 tablets (20-100 mg total) by mouth daily as needed. 09/20/21   [name], MD  sulfamethoxazole-trimethoprim (BACTRIM DS) 800-160 MG tablet Take 1 tablet by mouth 3 (three) times a week. 04/23/20   Little Ishikawa, MD  tamsulosin (FLOMAX) 0.4 MG CAPS capsule TAKE 1 CAPSULE BY MOUTH EVERY DAY 03/28/20   Koberlein, [name], MD  traMADol (ULTRAM) 50 MG tablet Take 50 mg by mouth every 8 (eight) hours as needed. 05/01/21   [provider]  triamcinolone cream (KENALOG) 0.1 % Apply 1 application topically 2 (two) times daily.    [provider]  TRUEPLUS INSULIN SYRINGE 31G X 5/16" 1 ML MISC Use pen needle with insulin 2 times daily 03/31/19   [name], MD      Allergies    Oxycodone    Review of Systems   Review of Systems  Physical Exam Updated Vital Signs BP (!) 220/100 (BP Location: Right Arm)    Pulse 79    Temp 98.1 F (36.7 C) (Oral)    Resp 12    Ht 6' (1.829 m)    Wt 77.1 kg    SpO2 99%    BMI 23.06 kg/m  Physical Exam Vitals and nursing note reviewed.  Constitutional:      Appearance: He is well-developed.  HENT:     Head: Normocephalic. No raccoon eyes or Battle's sign.     Comments: Patient with 3 cm right-sided temporal forehead hematoma.    Right Ear: Tympanic membrane, ear canal and external ear normal. No hemotympanum.     Left Ear: Tympanic membrane, ear canal and  external ear normal. No hemotympanum.     Nose: Nose normal.  Eyes:     General: Lids are normal.     Conjunctiva/sclera: Conjunctivae normal.     Pupils: Pupils are equal, round, and reactive to light.     Comments: No visible hyphema  Cardiovascular:     Rate and Rhythm: Normal rate and regular rhythm.  Pulmonary:     Effort: Pulmonary effort is normal.     Breath sounds: Normal breath sounds.  Abdominal:     Palpations: Abdomen is soft.     Tenderness: There is no abdominal tenderness.  Musculoskeletal:  General: Normal range of motion.     Cervical back: Normal range of motion and neck supple. No tenderness or bony tenderness.     Thoracic back: No tenderness or bony tenderness.     Lumbar back: No tenderness or bony tenderness.  Skin:    General: Skin is warm and dry.  Neurological:     Mental Status: He is alert and oriented to person, place, and time.     GCS: GCS eye subscore is 4. GCS verbal subscore is 5. GCS motor subscore is 6.     Cranial Nerves: No cranial nerve deficit.     Sensory: No sensory deficit.     Comments: Patient with right upper extremity weakness with contracture, right lower extremity weakness.  Patient is able to flex and extend left upper extremity at all joints.  There are some minor abrasions to the fingers without deformity or laceration.  Patient is able to flex and extend at the left hip and knee without difficulty.    ED Results / Procedures / Treatments   Labs (all labs ordered are listed, but only abnormal results are displayed) Labs Reviewed  COMPREHENSIVE METABOLIC PANEL - Abnormal; Notable for the following components:      Result Value   Glucose, Bld 164 (*)    BUN 37 (*)    Creatinine, Ser 9.60 (*)    Calcium 8.3 (*)    Albumin 3.1 (*)    GFR, Estimated 6 (*)    All other components within normal limits  CBC WITH DIFFERENTIAL/PLATELET - Abnormal; Notable for the following components:   RBC 3.66 (*)    Hemoglobin 11.6 (*)     HCT 37.3 (*)    MCV 101.9 (*)    RDW 17.5 (*)    All other components within normal limits  MAGNESIUM - Abnormal; Notable for the following components:   Magnesium 3.9 (*)    All other components within normal limits  RESP PANEL BY RT-PCR (FLU A&B, COVID) [id number]    ED ECG REPORT   Date: 10/19/2021  Rate: 82  Rhythm: normal sinus rhythm  QRS Axis: normal  Intervals: normal  ST/T Wave abnormalities: nonspecific ST/T changes  Conduction Disutrbances:none  Narrative Interpretation: Nonspecific inferolateral T wave changes very similar to 06/2021, also mildly elevated ST segment elevation in V1 and V2 similar to 06/2021  Old EKG Reviewed: unchanged  I have personally reviewed the EKG tracing and agree with the computerized printout as noted.   Radiology DG Pelvis Portable  Result Date: 10/19/2021 CLINICAL DATA:  Fall. EXAM: PORTABLE PELVIS 1-2 VIEWS COMPARISON:  December 21, 2020. FINDINGS: There is no evidence of pelvic fracture or diastasis. No pelvic bone lesions are seen. IMPRESSION: Negative. Electronically Signed   By: Marijo Conception M.D.   On: 10/19/2021 13:12   DG Chest Port 1 View  Result Date: 10/19/2021 CLINICAL DATA:  Fall. EXAM: PORTABLE CHEST 1 VIEW COMPARISON:  June 25, 2021. FINDINGS: Stable cardiomediastinal silhouette. Both lungs are clear. The visualized skeletal structures are unremarkable. IMPRESSION: No active disease. Electronically Signed   By: Marijo Conception M.D.   On: 10/19/2021 13:11    Procedures Procedures    Medications Ordered in ED Medications - No data to display  ED Course/ Medical Decision Making/ A&P    Patient seen and examined. History obtained directly from patient, clinic notes and lab work in epic, and from EMS report.  Most details from EMS due to patient not remembering.  Concern  for syncopal episode.    Labs/EKG: Ordered EKG, CBC, CMP, magnesium.  Imaging: Ordered portable x-ray of the chest and pelvis due to unclear events  regarding the patient's fall although overall low concern for significant chest or pelvis trauma.  CT of the head ordered.  Patient denies neck pain and has no new focal neurologic deficits.  Medications/Fluids: None ordered  Most recent vital signs reviewed and are as follows: BP (!) 220/100 (BP Location: Right Arm)    Pulse 79    Temp 98.1 F (36.7 C) (Oral)    Resp 12    Ht 6' (1.829 m)    Wt 77.1 kg    SpO2 99%    BMI 23.06 kg/m   Initial impression: Syncope of unclear etiology, minor head injury. Patient due for dialysis today but does not appear to be significantly overloaded.  2:00 PM Reassessment performed. Patient appears stable.   Labs and imaging to this point personally reviewed and interpreted including: CBC with stable mild anemia, normal white blood cell count, CMP shows elevated BUN/creatinine as expected in ESRD patient, normal potassium, magnesium elevated at 3.9.  Head CT negative agree no intracranial bleeding.  Reviewed pertinent lab work and imaging with patient at bedside including: Reassuring head CT, labs however elevated magnesium.  Patient does report taking milk of magnesia several days ago for constipation.  Most current vital signs reviewed and are as follows: BP (!) 219/99    Pulse 76    Temp 98.1 F (36.7 C) (Oral)    Resp (!) 22    Ht 6' (1.829 m)    Wt 77.1 kg    SpO2 100%    BMI 23.06 kg/m   Plan: I discussed results with Dr. Tyrone Nine.  I spoke with Dr. Jonnie Finner of nephrology regarding elevated magnesium level.  This is not an indication for emergent dialysis in the setting and if patient were to be admitted, patient would not be put on the schedule until Monday.  He asked that we let him know if patient does get admitted.  I discussed case with Dr. Lorin Mercy of [facility] hospitalist.  She is willing to see the patient and help determine appropriate disposition plan.  3:42 PM Dr. Lorin Mercy has seen and spoken with the patient.  Recommendations are very appreciated.   Current plan is to order home meds to ensure improvement in blood pressure and stat EEG.  If BP continues to be markedly elevated or if there are some abnormalities on EEG --plan for admission, otherwise patient can be discharged.  He is in agreement with this plan.  Signout to [employer] at shift change who will follow up on these tests and is aware of plan.                           Medical Decision Making Amount and/or Complexity of Data Reviewed Labs: ordered. Radiology: ordered.   Patient here in emergency department with head injury, level 2 trauma due to use of antiplatelet medication.  Questionable syncope versus seizure episode.  Patient is unable to give history and there are no eyewitness accounts of what happened.  Overall work-up was reassuring, however patient is high risk due to multiple comorbidities.  Awaiting completion of evaluation in the emergency department to determine disposition.        Final Clinical Impression(s) / ED Diagnoses Final diagnoses:  Trauma    Rx / DC Orders ED Discharge Orders  None         Carlisle Cater, PA-C 10/19/21 [place], [place], DO 10/20/21 [phone]

## 2021-10-19 NOTE — Assessment & Plan Note (Deleted)
-  Patient with "fall" from wheelchair -It is unclear exactly what happened - syncope vs. An accidental wheelchair nudge vs. Patient falling asleep and falling into the floor vs. Other -He denies symptoms prior to event or after, other than headache from hitting his head in fall -CT head unremarkable -As such, we discussed options to include overnight observation and dc back to SNF -Based on discussion, will order EEG and give HTN medications -If unremarkable EEG, BP improves, and patient continues to feel well, he should be appropriate for dc back to SNF this afternoon -Will hold admission at this time -Patient and EDP are in agreement with this plan

## 2021-10-19 NOTE — ED Notes (Signed)
X-ray at bedside

## 2021-10-19 NOTE — Progress Notes (Signed)
EEG complete - results pending 

## 2021-10-19 NOTE — ED Triage Notes (Signed)
Pt BIB [facility] from [place] as Level 2 Fall on thinners. Pt has no recollection of the event happening but is A/Ox4 now. Pt reports he was sitting in his W/C smoking a cigarette outside & woke up on the way to the facility. EMS reports he was responding to them but cannot remember anything of what happened unil they arrived to th ED. He is HIV +, Hx of stroke, Lt arm restricted d/t fistula. 164/100, 82 bpm, 98%, CBG 229.

## 2021-10-20 ENCOUNTER — Observation Stay (HOSPITAL_BASED_OUTPATIENT_CLINIC_OR_DEPARTMENT_OTHER): Payer: No Typology Code available for payment source

## 2021-10-20 ENCOUNTER — Observation Stay (HOSPITAL_COMMUNITY): Payer: No Typology Code available for payment source

## 2021-10-20 DIAGNOSIS — E785 Hyperlipidemia, unspecified: Secondary | ICD-10-CM | POA: Diagnosis present

## 2021-10-20 DIAGNOSIS — Z992 Dependence on renal dialysis: Secondary | ICD-10-CM | POA: Diagnosis not present

## 2021-10-20 DIAGNOSIS — B20 Human immunodeficiency virus [HIV] disease: Secondary | ICD-10-CM | POA: Diagnosis present

## 2021-10-20 DIAGNOSIS — E1142 Type 2 diabetes mellitus with diabetic polyneuropathy: Secondary | ICD-10-CM | POA: Diagnosis present

## 2021-10-20 DIAGNOSIS — S0083XA Contusion of other part of head, initial encounter: Secondary | ICD-10-CM | POA: Diagnosis present

## 2021-10-20 DIAGNOSIS — S60512A Abrasion of left hand, initial encounter: Secondary | ICD-10-CM | POA: Diagnosis present

## 2021-10-20 DIAGNOSIS — N186 End stage renal disease: Secondary | ICD-10-CM | POA: Diagnosis present

## 2021-10-20 DIAGNOSIS — I5042 Chronic combined systolic (congestive) and diastolic (congestive) heart failure: Secondary | ICD-10-CM | POA: Diagnosis present

## 2021-10-20 DIAGNOSIS — I169 Hypertensive crisis, unspecified: Secondary | ICD-10-CM | POA: Diagnosis present

## 2021-10-20 DIAGNOSIS — W050XXA Fall from non-moving wheelchair, initial encounter: Secondary | ICD-10-CM | POA: Diagnosis present

## 2021-10-20 DIAGNOSIS — Z20822 Contact with and (suspected) exposure to covid-19: Secondary | ICD-10-CM | POA: Diagnosis present

## 2021-10-20 DIAGNOSIS — R55 Syncope and collapse: Secondary | ICD-10-CM

## 2021-10-20 DIAGNOSIS — F17210 Nicotine dependence, cigarettes, uncomplicated: Secondary | ICD-10-CM | POA: Diagnosis present

## 2021-10-20 DIAGNOSIS — I69351 Hemiplegia and hemiparesis following cerebral infarction affecting right dominant side: Secondary | ICD-10-CM | POA: Diagnosis not present

## 2021-10-20 DIAGNOSIS — K219 Gastro-esophageal reflux disease without esophagitis: Secondary | ICD-10-CM | POA: Diagnosis present

## 2021-10-20 DIAGNOSIS — G40909 Epilepsy, unspecified, not intractable, without status epilepticus: Secondary | ICD-10-CM | POA: Diagnosis present

## 2021-10-20 DIAGNOSIS — N400 Enlarged prostate without lower urinary tract symptoms: Secondary | ICD-10-CM | POA: Diagnosis present

## 2021-10-20 DIAGNOSIS — D631 Anemia in chronic kidney disease: Secondary | ICD-10-CM | POA: Diagnosis present

## 2021-10-20 DIAGNOSIS — E1165 Type 2 diabetes mellitus with hyperglycemia: Secondary | ICD-10-CM | POA: Diagnosis present

## 2021-10-20 DIAGNOSIS — Z8616 Personal history of COVID-19: Secondary | ICD-10-CM | POA: Diagnosis not present

## 2021-10-20 DIAGNOSIS — I132 Hypertensive heart and chronic kidney disease with heart failure and with stage 5 chronic kidney disease, or end stage renal disease: Secondary | ICD-10-CM | POA: Diagnosis present

## 2021-10-20 DIAGNOSIS — N2581 Secondary hyperparathyroidism of renal origin: Secondary | ICD-10-CM | POA: Diagnosis present

## 2021-10-20 DIAGNOSIS — J309 Allergic rhinitis, unspecified: Secondary | ICD-10-CM | POA: Diagnosis present

## 2021-10-20 DIAGNOSIS — E1122 Type 2 diabetes mellitus with diabetic chronic kidney disease: Secondary | ICD-10-CM | POA: Diagnosis present

## 2021-10-20 LAB — CBC WITH DIFFERENTIAL/PLATELET
Abs Immature Granulocytes: 0.01 10*3/uL (ref 0.00–0.07)
Basophils Absolute: 0 10*3/uL (ref 0.0–0.1)
Basophils Relative: 1 %
Eosinophils Absolute: 0.3 10*3/uL (ref 0.0–0.5)
Eosinophils Relative: 5 %
HCT: 37.5 % — ABNORMAL LOW (ref 39.0–52.0)
Hemoglobin: 11.6 g/dL — ABNORMAL LOW (ref 13.0–17.0)
Immature Granulocytes: 0 %
Lymphocytes Relative: 26 %
Lymphs Abs: 1.7 10*3/uL (ref 0.7–4.0)
MCH: 30.5 pg (ref 26.0–34.0)
MCHC: 30.9 g/dL (ref 30.0–36.0)
MCV: 98.7 fL (ref 80.0–100.0)
Monocytes Absolute: 0.7 10*3/uL (ref 0.1–1.0)
Monocytes Relative: 11 %
Neutro Abs: 3.8 10*3/uL (ref 1.7–7.7)
Neutrophils Relative %: 57 %
Platelets: 236 10*3/uL (ref 150–400)
RBC: 3.8 MIL/uL — ABNORMAL LOW (ref 4.22–5.81)
RDW: 17.2 % — ABNORMAL HIGH (ref 11.5–15.5)
WBC: 6.6 10*3/uL (ref 4.0–10.5)
nRBC: 0 % (ref 0.0–0.2)

## 2021-10-20 LAB — MAGNESIUM: Magnesium: 4.1 mg/dL — ABNORMAL HIGH (ref 1.7–2.4)

## 2021-10-20 LAB — ECHOCARDIOGRAM COMPLETE
Area-P 1/2: 6.43 cm2
Calc EF: 52 %
Height: 72 in
S' Lateral: 3.7 cm
Single Plane A2C EF: 52.6 %
Single Plane A4C EF: 48.7 %
Weight: 2716.07 oz

## 2021-10-20 LAB — PHOSPHORUS: Phosphorus: 5.8 mg/dL — ABNORMAL HIGH (ref 2.5–4.6)

## 2021-10-20 LAB — COMPREHENSIVE METABOLIC PANEL
ALT: 11 U/L (ref 0–44)
AST: 15 U/L (ref 15–41)
Albumin: 3.3 g/dL — ABNORMAL LOW (ref 3.5–5.0)
Alkaline Phosphatase: 94 U/L (ref 38–126)
Anion gap: 14 (ref 5–15)
BUN: 43 mg/dL — ABNORMAL HIGH (ref 8–23)
CO2: 28 mmol/L (ref 22–32)
Calcium: 8.5 mg/dL — ABNORMAL LOW (ref 8.9–10.3)
Chloride: 100 mmol/L (ref 98–111)
Creatinine, Ser: 11.09 mg/dL — ABNORMAL HIGH (ref 0.61–1.24)
GFR, Estimated: 5 mL/min — ABNORMAL LOW (ref >60)
Glucose, Bld: 114 mg/dL — ABNORMAL HIGH (ref 70–99)
Potassium: 4.5 mmol/L (ref 3.5–5.1)
Sodium: 142 mmol/L (ref 135–145)
Total Bilirubin: 0.5 mg/dL (ref 0.3–1.2)
Total Protein: 7.3 g/dL (ref 6.5–8.1)

## 2021-10-20 LAB — GLUCOSE, CAPILLARY
Glucose-Capillary: 112 mg/dL — ABNORMAL HIGH (ref 70–99)
Glucose-Capillary: 191 mg/dL — ABNORMAL HIGH (ref 70–99)
Glucose-Capillary: 118 mg/dL — ABNORMAL HIGH (ref 70–99)
Glucose-Capillary: 171 mg/dL — ABNORMAL HIGH (ref 70–99)

## 2021-10-20 LAB — TSH: TSH: 1.575 u[IU]/mL (ref 0.350–4.500)

## 2021-10-20 MED ORDER — EPOETIN ALFA-EPBX 10000 UNIT/ML IJ SOLN
20000.0000 [IU] | INTRAMUSCULAR | Status: DC
  Filled 2021-10-20: qty 2

## 2021-10-20 MED ORDER — SULFAMETHOXAZOLE-TRIMETHOPRIM 800-160 MG PO TABS
1.0000 | ORAL_TABLET | ORAL | Status: DC
  Administered 2021-10-21: 1 via ORAL
  Filled 2021-10-20: qty 1

## 2021-10-20 MED ORDER — EPOETIN ALFA-EPBX 10000 UNIT/ML IJ SOLN: 20000.0000 [IU] | INTRAMUSCULAR | Status: DC

## 2021-10-20 MED ORDER — HEPARIN SODIUM (PORCINE) 5000 UNIT/ML IJ SOLN
5000.0000 [IU] | Freq: Three times a day (TID) | INTRAMUSCULAR | Status: DC
  Administered 2021-10-20 – 2021-10-22 (×6): 5000 [IU] via SUBCUTANEOUS
  Filled 2021-10-20 (×7): qty 1

## 2021-10-20 MED ORDER — DOCUSATE SODIUM 100 MG PO CAPS
100.0000 mg | ORAL_CAPSULE | Freq: Every day | ORAL | Status: DC
  Administered 2021-10-20 – 2021-10-22 (×3): 100 mg via ORAL
  Filled 2021-10-20 (×3): qty 1

## 2021-10-20 MED ORDER — DOXEPIN HCL 25 MG PO CAPS
75.0000 mg | ORAL_CAPSULE | Freq: Every day | ORAL | Status: DC
  Administered 2021-10-20 – 2021-10-21 (×2): 75 mg via ORAL
  Filled 2021-10-20: qty 1
  Filled 2021-10-20: qty 3
  Filled 2021-10-20: qty 1
  Filled 2021-10-20 (×2): qty 3

## 2021-10-20 NOTE — Progress Notes (Signed)
°  Echocardiogram 2D Echocardiogram has been performed.  Richard [name] 10/20/2021, 8:47 AM

## 2021-10-20 NOTE — Progress Notes (Signed)
PROGRESS NOTE    Richard [name]  WGN:[id number] DOB: 1955/12/22 DOA: 10/19/2021 PCP: [name], MD    Chief Complaint  Patient presents with   Fall   Level 2 - On thinners    Brief Narrative: Richard [name] is a 66 y.o. 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   Assessment & Plan:   Principal Problem:   Hypertensive crisis Active Problems:   Tobacco abuse   Hyperlipidemia   DM2 (diabetes mellitus, type 2) ([place])   History of cerebrovascular accident (CVA) with residual deficit   Seizure ([place])   ESRD on hemodialysis ([place])   Hypermagnesemia   Prolonged QT interval   Allergic rhinitis  Hypertensive Crisis:  Improving BP parameters. Remains on nitro gtt.  Echocardiogram showed worsening LVEF of 45 to 50%. The left ventricle demonstrates regional wall motion abnormalities. Moderate left ventricular hypertrophy. Left ventricular diastolic parameters are consistent with grade 1 diastolic dysfunction.  Will request cardiology consultation in am, to evaluate for ischemic work up.  Meanwhile continue with nitro gtt, coreg 12.5 mg BID, clonidine 0.1 mg TID.   EKG shows NSR with LVH, prolonged Qtc.  Repeat EKG today.  Pt denies any chest pain.    Syncope:  - unclear etiology.  Monitor for arrhythmias. Differentials include Seizures, vs ischemic event.  CT head showed Old lacunar infarcts are seen in the left basal ganglia and periventricular region. No significant interval changes are noted since 08/09/2020. - EEG is negative for active seizures.  -     ESRD on HD:  Nephrology on board.    H/o CVA:  With residual deficits.  Continue with plavix and statin.     Type 2 DM: with hyperglycemia:  CBG (last 3)  Recent Labs    10/19/21 2236 10/20/21 0557 10/20/21 1127  GLUCAP 190* 112* 191*   Resume SSI.   Check A1c.     Seizures:  Continue with keppra .    BPH Continue with flomax.    GERD;  Stable on protonix.     HIV: Continue with Biktarvy and on Bactrim.     DVT prophylaxis: heparin.  Code Status: full code.  Family Communication: none at bedside.  Disposition:   Status is: Observation  The patient will require care spanning > 2 midnights and should be moved to inpatient because: IV nitro      Consultants:  None.   Procedures:  Echocardiogram.   Antimicrobials: none.   Subjective:   Objective: Vitals:   10/20/21 0842 10/20/21 0853 10/20/21 1130 10/20/21 1226  BP: (!) 180/74 (!) 155/80 (!) 189/84 (!) 154/80  Pulse: 80  82   Resp:  20    Temp:  98.8 F (37.1 C) 98.7 F (37.1 C)   TempSrc:  Oral Oral   SpO2: 100% 98% 99%   Weight:      Height:        Intake/Output Summary (Last 24 hours) at 10/20/2021 1517 Last data filed at 10/20/2021 1300 Gross per 24 hour  Intake 327.68 ml  Output 525 ml  Net -197.32 ml   Filed Weights   10/19/21 1252 10/19/21 2241 10/20/21 0415  Weight: 77.1 kg  77.8 kg 77 kg    Examination:  General exam: Appears calm and comfortable  Respiratory system: Clear to auscultation. Respiratory effort normal. Cardiovascular system: S1 & S2 heard, RRR. No JVD, No pedal edema. Gastrointestinal system: Abdomen is nondistended, soft and nontender. Normal bowel sounds heard. Central nervous system: Alert and oriented. No focal neurological deficits. Extremities: Symmetric 5 x 5 power. Skin: No rashes, lesions or ulcers Psychiatry: Mood & affect appropriate.     Data Reviewed: I have personally reviewed following labs and imaging studies  CBC: Recent  Labs  Lab 10/19/21 1300 10/20/21 0915  WBC 6.9 6.6  NEUTROABS 4.1 3.8  HGB 11.6* 11.6*  HCT 37.3* 37.5*  MCV 101.9* 98.7  PLT 176 767    Basic Metabolic Panel: Recent Labs  Lab 10/19/21 1300 10/20/21 0915  NA 137 142  K 4.7 4.5  CL 98 100  CO2 26 28  GLUCOSE 164* 114*  BUN 37* 43*  CREATININE 9.60* 11.09*  CALCIUM 8.3* 8.5*  MG 3.9* 4.1*  PHOS  --  5.8*    GFR: Estimated Creatinine Clearance: 7.3 mL/min (A) (by C-G formula based on SCr of 11.09 mg/dL (H)).  Liver Function Tests: Recent Labs  Lab 10/19/21 1300 10/20/21 0915  AST 18 15  ALT 9 11  ALKPHOS 110 94  BILITOT 0.4 0.5  PROT 6.5 7.3  ALBUMIN 3.1* 3.3*    CBG: Recent Labs  Lab 10/19/21 2236 10/20/21 0557 10/20/21 1127  GLUCAP 190* 112* 191*     Recent Results (from the past 240 hour(s))  Resp Panel by RT-PCR (Flu A&B, Covid) Nasopharyngeal Swab     Status: None   Collection Time: 10/19/21  2:29 PM   Specimen: Nasopharyngeal Swab; Nasopharyngeal(NP) swabs in vial transport medium  Result Value Ref Range Status   SARS Coronavirus 2 by RT PCR NEGATIVE NEGATIVE Final    Comment: (NOTE) SARS-CoV-2 target nucleic acids are NOT DETECTED.  The SARS-CoV-2 RNA is generally detectable in upper respiratory specimens during the acute phase of infection. The lowest concentration of SARS-CoV-2 viral copies this assay can detect is 138 copies/mL. A negative result does not preclude SARS-Cov-2 infection and should not be used as the sole basis for treatment or other patient management decisions. A negative result may occur with  improper specimen collection/handling, submission of specimen other than nasopharyngeal swab, presence of viral mutation(s) within the areas targeted by this assay, and inadequate number of viral copies(<138 copies/mL). A negative result must be combined with clinical observations, patient history, and epidemiological information. The expected result is Negative.  Fact Sheet  for Patients:  EntrepreneurPulse.com.au  Fact Sheet for Healthcare Providers:  IncredibleEmployment.be  This test is no t yet approved or cleared by the [place] FDA and  has been authorized for detection and/or diagnosis of SARS-CoV-2 by FDA under an Emergency Use Authorization (EUA). This EUA will remain  in effect (meaning this test can be used) for the duration of the COVID-19 declaration under Section 564(b)(1) of the Act, 21 U.S.C.section 360bbb-3(b)(1), unless the authorization is terminated  or revoked sooner.       Influenza A by PCR NEGATIVE NEGATIVE Final   Influenza B by PCR NEGATIVE NEGATIVE Final    Comment: (NOTE) The Xpert Xpress SARS-CoV-2/FLU/RSV plus assay is intended as an aid in the diagnosis of influenza from Nasopharyngeal swab specimens and should not be used as a sole basis for treatment. Nasal washings and aspirates are unacceptable for Xpert Xpress SARS-CoV-2/FLU/RSV testing.  Fact Sheet  for Patients: EntrepreneurPulse.com.au  Fact Sheet for Healthcare Providers: IncredibleEmployment.be  This test is not yet approved or cleared by the [place] FDA and has been authorized for detection and/or diagnosis of SARS-CoV-2 by FDA under an Emergency Use Authorization (EUA). This EUA will remain in effect (meaning this test can be used) for the duration of the COVID-19 declaration under Section 564(b)(1) of the Act, 21 U.S.C. section 360bbb-3(b)(1), unless the authorization is terminated or revoked.  Performed at [facility], [place] [address] [place],  [address]          Radiology Studies: CT HEAD WO CONTRAST  Result Date: 10/19/2021 CLINICAL DATA:  Trauma, patient on anticoagulation regimen. EXAM: CT HEAD WITHOUT CONTRAST TECHNIQUE: Contiguous axial images were obtained from the base of the skull through the vertex without intravenous contrast. RADIATION  DOSE REDUCTION: This exam was performed according to the departmental dose-optimization program which includes automated exposure control, adjustment of the mA and/or kV according to patient size and/or use of iterative reconstruction technique. COMPARISON:  08/09/2020 FINDINGS: Brain: No acute intracranial findings are seen. Cortical sulci are prominent. There is decreased density in the periventricular and subcortical white matter. There are small old lacunar infarcts in the left basal ganglia and left periventricular region. Vascular: There are scattered arterial calcifications. Skull: No fracture is seen. Sinuses/Orbits: There is mucosal thickening in the frontal and ethmoid sinuses. Other: None IMPRESSION: No acute intracranial findings are seen in noncontrast CT brain. Atrophy. Small-vessel disease. Old lacunar infarcts are seen in the left basal ganglia and periventricular region. No significant interval changes are noted since 08/09/2020. There is fluid density in right side of frontal sinus and right ethmoid sinus, possibly sinusitis. Electronically Signed   By: Elmer Picker M.D.   On: 10/19/2021 13:36   DG Pelvis Portable  Result Date: 10/19/2021 CLINICAL DATA:  Fall. EXAM: PORTABLE PELVIS 1-2 VIEWS COMPARISON:  December 21, 2020. FINDINGS: There is no evidence of pelvic fracture or diastasis. No pelvic bone lesions are seen. IMPRESSION: Negative. Electronically Signed   By: Marijo Conception M.D.   On: 10/19/2021 13:12   DG Chest Port 1 View  Result Date: 10/19/2021 CLINICAL DATA:  Fall. EXAM: PORTABLE CHEST 1 VIEW COMPARISON:  June 25, 2021. FINDINGS: Stable cardiomediastinal silhouette. Both lungs are clear. The visualized skeletal structures are unremarkable. IMPRESSION: No active disease. Electronically Signed   By: Marijo Conception M.D.   On: 10/19/2021 13:11   EEG adult  Result Date: 10/19/2021 Lora Havens, MD     10/19/2021  5:01 PM Patient Name: Richard [name] MRN:  [id number] Epilepsy Attending: Lora Havens Referring Physician/Provider: Karmen Bongo, MD Date: 10/19/2021 Duration: 22.26 mins Patient history: 66yo m with h/o seizures who presented after fall. EEG to evaluate for seizure. Level of alertness: Awake, drowsy AEDs during EEG study: LEV Technical aspects: This EEG study was done with scalp electrodes positioned according to the 10-20 International system of electrode placement. Electrical activity was acquired at a sampling rate of 500Hz  and reviewed with a high frequency filter of 70Hz  and a low frequency filter of 1Hz . EEG data were recorded continuously and digitally stored. Description: The posterior dominant rhythm consists of 7.5 Hz activity of moderate voltage (25-35 uV) seen predominantly in posterior head regions, symmetric and reactive to eye opening and eye closing. Drowsiness was characterized by attenuation of the posterior background. rhythm.Hyperventilation and photic stimulation were not performed.   IMPRESSION: This study is within normal limits. No  seizures or epileptiform discharges were seen throughout the recording. Lora Havens   ECHOCARDIOGRAM COMPLETE  Result Date: 10/20/2021    ECHOCARDIOGRAM REPORT   Patient Name:   [facility] Date of Exam: 10/20/2021 Medical Rec #:  [id number]             Height:       72.0 in Accession #:    [id number]            Weight:       169.8 lb Date of Birth:  May 05, 1956             BSA:          1.987 m Patient Age:    89 years              BP:           170/82 mmHg Patient Gender: M                     HR:           87 bpm. Exam Location:  Inpatient Procedure: 2D Echo, Cardiac Doppler and Color Doppler Indications:    R55 Syncope  History:        Patient has prior history of Echocardiogram examinations, most                 recent 02/17/2021. Stroke; Risk Factors:Hypertension and                 Diabetes.  Sonographer:    Bernadene Person RDCS Referring Phys: [id number] [place]  1. Left ventricular ejection fraction, by estimation, is 45 to 50%. The left ventricle has mildly decreased function. The left ventricle demonstrates regional wall motion abnormalities (see scoring diagram/findings for description). There is moderate left ventricular hypertrophy. Left ventricular diastolic parameters are consistent with Grade I diastolic dysfunction (impaired relaxation).  2. Right ventricular systolic function is normal. The right ventricular size is normal.  3. The mitral valve is normal in structure. No evidence of mitral valve regurgitation. No evidence of mitral stenosis.  4. The aortic valve is tricuspid. There is mild calcification of the aortic valve. Aortic valve regurgitation is not visualized. Aortic valve sclerosis is present, with no evidence of aortic valve stenosis.  5. The inferior vena cava is normal in size with greater than 50% respiratory variability, suggesting right atrial pressure of 3 mmHg. Comparison(s): Prior images reviewed side by side. FINDINGS  Left Ventricle: Left ventricular ejection fraction, by estimation, is 45 to 50%. The left ventricle has mildly decreased function. The left ventricle demonstrates regional wall motion abnormalities. The left ventricular internal cavity size was normal in size. There is moderate left ventricular hypertrophy. Left ventricular diastolic parameters are consistent with Grade I diastolic dysfunction (impaired relaxation).  LV Wall Scoring: The inferior wall is akinetic. Right Ventricle: The right ventricular size is normal. No increase in right ventricular wall thickness. Right ventricular systolic function is normal. Left Atrium: Left atrial size was normal in size. Right Atrium: Right atrial size was normal in size. Pericardium: There is no evidence of pericardial effusion. Mitral Valve: The mitral valve is normal in structure. No evidence of mitral valve regurgitation. No evidence of mitral valve stenosis. Tricuspid  Valve: The tricuspid valve is normal in structure. Tricuspid valve regurgitation is not demonstrated. No evidence of tricuspid stenosis. Aortic Valve: The aortic valve is tricuspid. There is mild calcification of the aortic valve. Aortic valve regurgitation  is not visualized. Aortic valve sclerosis is present, with no evidence of aortic valve stenosis. Pulmonic Valve: The pulmonic valve was normal in structure. Pulmonic valve regurgitation is not visualized. No evidence of pulmonic stenosis. Aorta: The aortic root is normal in size and structure. Venous: The inferior vena cava is normal in size with greater than 50% respiratory variability, suggesting right atrial pressure of 3 mmHg. IAS/Shunts: No atrial level shunt detected by color flow Doppler.  LEFT VENTRICLE PLAX 2D LVIDd:         5.00 cm LVIDs:         3.70 cm LV PW:         1.40 cm LV IVS:        1.30 cm LVOT diam:     2.10 cm LV SV:         61 LV SV Index:   31 LVOT Area:     3.46 cm  LV Volumes (MOD) LV vol d, MOD A2C: 110.0 ml LV vol d, MOD A4C: 111.0 ml LV vol s, MOD A2C: 52.1 ml LV vol s, MOD A4C: 56.9 ml LV SV MOD A2C:     57.9 ml LV SV MOD A4C:     111.0 ml LV SV MOD BP:      59.4 ml RIGHT VENTRICLE RV S prime:     9.67 cm/s TAPSE (M-mode): 2.1 cm LEFT ATRIUM           Index        RIGHT ATRIUM           Index LA diam:      3.70 cm 1.86 cm/m   RA Area:     14.10 cm LA Vol (A2C): 61.3 ml 30.85 ml/m  RA Volume:   29.10 ml  14.64 ml/m LA Vol (A4C): 44.1 ml 22.19 ml/m  AORTIC VALVE LVOT Vmax:   89.70 cm/s LVOT Vmean:  61.600 cm/s LVOT VTI:    0.175 m  AORTA Ao Root diam: 3.50 cm Ao Asc diam:  3.40 cm MITRAL VALVE MV Area (PHT): 6.43 cm     SHUNTS MV Decel Time: 118 msec     Systemic VTI:  0.18 m MV E velocity: 81.20 cm/s   Systemic Diam: 2.10 cm MV A velocity: 151.00 cm/s MV E/A ratio:  0.54 Candee Furbish MD Electronically signed by Candee Furbish MD Signature Date/Time: 10/20/2021/11:20:39 AM    Final    VAS US CAROTID  Result Date:  10/20/2021 Carotid Arterial Duplex Study Patient Name:  [facility]  Date of Exam:   10/20/2021 Medical Rec #: [id number]              Accession #:    [id number] Date of Birth: [birth year]              Patient Gender: M Patient Age:   24 years Exam Location:  [facility] Procedure:      VAS US CAROTID Referring Phys: Babs Bertin --------------------------------------------------------------------------------  Indications:       Syncope. Risk Factors:      Hypertension, hyperlipidemia, Diabetes. Limitations        Today's exam was limited due to the high bifurcation of the                    carotid, the body habitus of the patient and the patient's                    respiratory variation. Comparison  Study:  No prior studies. Performing Technologist: Oliver Hum RVT  Examination Guidelines: A complete evaluation includes B-mode imaging, spectral Doppler, color Doppler, and power Doppler as needed of all accessible portions of each vessel. Bilateral testing is considered an integral part of a complete examination. Limited examinations for reoccurring indications may be performed as noted.  Right Carotid Findings: +----------+--------+--------+--------+-----------------------+--------+             PSV cm/s EDV cm/s Stenosis Plaque Description      Comments  +----------+--------+--------+--------+-----------------------+--------+  CCA Prox   77       11                smooth and heterogenous           +----------+--------+--------+--------+-----------------------+--------+  CCA Distal 57       11                smooth and heterogenous           +----------+--------+--------+--------+-----------------------+--------+  ICA Prox   78       11                smooth and heterogenous           +----------+--------+--------+--------+-----------------------+--------+  ICA Distal 44       11                                        tortuous   +----------+--------+--------+--------+-----------------------+--------+  ECA        29       3                                                   +----------+--------+--------+--------+-----------------------+--------+ +----------+--------+-------+--------+-------------------+             PSV cm/s EDV cms Describe Arm Pressure (mmHG)  +----------+--------+-------+--------+-------------------+  Subclavian 100                                            +----------+--------+-------+--------+-------------------+ +---------+--------+--+--------+-+----------------------------+  Vertebral PSV cm/s 31 EDV cm/s 5 Antegrade and High resistant  +---------+--------+--+--------+-+----------------------------+  Left Carotid Findings: +----------+--------+--------+--------+-----------------------+--------+             PSV cm/s EDV cm/s Stenosis Plaque Description      Comments  +----------+--------+--------+--------+-----------------------+--------+  CCA Prox   70       11                smooth and heterogenous           +----------+--------+--------+--------+-----------------------+--------+  CCA Distal 67       11                smooth and heterogenous           +----------+--------+--------+--------+-----------------------+--------+  ICA Prox   83       12                smooth and heterogenous           +----------+--------+--------+--------+-----------------------+--------+  ICA Distal 43       9  tortuous  +----------+--------+--------+--------+-----------------------+--------+  ECA        62       5                                                   +----------+--------+--------+--------+-----------------------+--------+ +----------+--------+--------+--------+-------------------+             PSV cm/s EDV cm/s Describe Arm Pressure (mmHG)  +----------+--------+--------+--------+-------------------+  Subclavian 161                                              +----------+--------+--------+--------+-------------------+ +---------+--------+--+--------+-+----------------------------+  Vertebral PSV cm/s 23 EDV cm/s 5 Antegrade and High resistant  +---------+--------+--+--------+-+----------------------------+   Summary: Right Carotid: Velocities in the right ICA are consistent with a 1-39% stenosis. Left Carotid: Velocities in the left ICA are consistent with a 1-39% stenosis. Vertebrals: Bilateral vertebral arteries demonstrate high resistant flow. *See table(s) above for measurements and observations.     Preliminary         Scheduled Meds:  bictegravir-emtricitabine-tenofovir AF  1 tablet Oral Daily   calcium acetate (Phos Binder)  1,334 mg Oral TID WC   carvedilol  12.5 mg Oral BID WC   cloNIDine  0.1 mg Oral TID   clopidogrel  75 mg Oral Daily   docusate sodium  100 mg Oral Daily   doxepin  75 mg Oral QHS   [START ON 10/21/2021] epoetin alfa-epbx (RETACRIT) injection  20,000 Units Subcutaneous Q Mon   insulin aspart  0-6 Units Subcutaneous TID WC   levETIRAcetam  500 mg Oral BID   pantoprazole  40 mg Oral Daily   pravastatin  80 mg Oral QHS   [START ON 10/21/2021] sulfamethoxazole-trimethoprim  1 tablet Oral Q M,W,F   tamsulosin  0.4 mg Oral Daily   Continuous Infusions:  nitroGLYCERIN 20 mcg/min (10/19/21 2342)     LOS: 0 days        Hosie Poisson, MD [facility]   To contact the attending provider between 7A-7P or the covering provider during after hours 7P-7A, please log into the web site [email or web address] and access using universal [place] password for that web site. If you do not have the password, please call the hospital operator.  10/20/2021, 3:17 PM

## 2021-10-20 NOTE — Plan of Care (Signed)
Problem: Skin Integrity: Goal: Risk for impaired skin integrity will decrease Outcome: Completed/Met   Problem: Pain Managment: Goal: General experience of comfort will improve Outcome: Completed/Met   Problem: Nutrition: Goal: Adequate nutrition will be maintained Outcome: Completed/Met   Problem: Clinical Measurements: Goal: Will remain free from infection Outcome: Completed/Met

## 2021-10-20 NOTE — Progress Notes (Signed)
°   10/20/21 1809  Assess: MEWS Score  BP (!) 221/82  SpO2 100 %  O2 Device Room Air  Assess: MEWS Score  MEWS Temp 0  MEWS Systolic 2  MEWS Pulse 0  MEWS RR 1  MEWS LOC 0  MEWS Score 3  MEWS Score Color Yellow  Treat  Pain Scale 0-10  Pain Score 0  Take Vital Signs  Increase Vital Sign Frequency  Yellow: Q 2hr X 2 then Q 4hr X 2, if remains yellow, continue Q 4hrs  Escalate  MEWS: Escalate Yellow: discuss with charge nurse/RN and consider discussing with provider and RRT  Notify: Charge Nurse/RN  Name of Charge Nurse/RN Notified Velia, RN  Date Charge Nurse/RN Notified 10/20/21  Time Charge Nurse/RN Notified 1815  Notify: Provider  Provider Name/Title Karleen Hampshire  Date Provider Notified 10/20/21  Time Provider Notified 1726  Notification Type Page  Notification Reason Other (Comment) (increase in BP)  Provider response Other (Comment) (Aware, will contact renal)  Date of Provider Response 10/20/21  Time of Provider Response 1727  Document  Patient Outcome Other (Comment) (medication titrated)

## 2021-10-20 NOTE — Care Management (Signed)
°  Transition of Care Department [facility]) has reviewed patient we will continue to monitor patient advancement through interdisciplinary progression rounds.

## 2021-10-20 NOTE — Progress Notes (Signed)
Carotid artery duplex has been completed. Preliminary results can be found in CV Proc through chart review.   10/20/21 11:34 AM Carlos Levering RVT

## 2021-10-20 NOTE — Progress Notes (Signed)
BP controled during the day however started to be high again, will adjust Nitro drip as needed. Per Pt last HD was on Thursday, no HD since than. Paged MD Karleen Hampshire and made her aware about BP and HD

## 2021-10-20 NOTE — Progress Notes (Signed)
PT Cancellation Note  Patient Details Name: Richard [name] MRN: [id number] DOB: [birth year]   Cancelled Treatment:    Reason Eval/Treat Not Completed: Patient declined, no reason specified. Will continue attempts.   Shary Decamp [place] 10/20/2021, 2:20 PM [place] Pager [phone] Office [phone]

## 2021-10-21 DIAGNOSIS — I169 Hypertensive crisis, unspecified: Principal | ICD-10-CM

## 2021-10-21 LAB — CBC
HCT: 31.2 % — ABNORMAL LOW (ref 39.0–52.0)
Hemoglobin: 10.1 g/dL — ABNORMAL LOW (ref 13.0–17.0)
MCH: 31.9 pg (ref 26.0–34.0)
MCHC: 32.4 g/dL (ref 30.0–36.0)
MCV: 98.4 fL (ref 80.0–100.0)
Platelets: 198 10*3/uL (ref 150–400)
RBC: 3.17 MIL/uL — ABNORMAL LOW (ref 4.22–5.81)
RDW: 17 % — ABNORMAL HIGH (ref 11.5–15.5)
WBC: 7 10*3/uL (ref 4.0–10.5)
nRBC: 0 % (ref 0.0–0.2)

## 2021-10-21 LAB — BASIC METABOLIC PANEL
Anion gap: 15 (ref 5–15)
BUN: 48 mg/dL — ABNORMAL HIGH (ref 8–23)
CO2: 26 mmol/L (ref 22–32)
Calcium: 8.2 mg/dL — ABNORMAL LOW (ref 8.9–10.3)
Chloride: 99 mmol/L (ref 98–111)
Creatinine, Ser: 11.78 mg/dL — ABNORMAL HIGH (ref 0.61–1.24)
GFR, Estimated: 4 mL/min — ABNORMAL LOW (ref >60)
Glucose, Bld: 131 mg/dL — ABNORMAL HIGH (ref 70–99)
Potassium: 4.1 mmol/L (ref 3.5–5.1)
Sodium: 140 mmol/L (ref 135–145)

## 2021-10-21 LAB — HEMOGLOBIN A1C
Hgb A1c MFr Bld: 5.2 % (ref 4.8–5.6)
Mean Plasma Glucose: 102.54 mg/dL

## 2021-10-21 LAB — GLUCOSE, CAPILLARY
Glucose-Capillary: 117 mg/dL — ABNORMAL HIGH (ref 70–99)
Glucose-Capillary: 101 mg/dL — ABNORMAL HIGH (ref 70–99)
Glucose-Capillary: 139 mg/dL — ABNORMAL HIGH (ref 70–99)
Glucose-Capillary: 219 mg/dL — ABNORMAL HIGH (ref 70–99)

## 2021-10-21 LAB — LIPID PANEL
Cholesterol: 103 mg/dL (ref 0–200)
HDL: 41 mg/dL (ref >40)
LDL Cholesterol: 46 mg/dL (ref 0–99)
Total CHOL/HDL Ratio: 2.5 RATIO
Triglycerides: 80 mg/dL (ref <150)
VLDL: 16 mg/dL (ref 0–40)

## 2021-10-21 LAB — CALCIUM, IONIZED: Calcium, Ionized, Serum: 4.5 mg/dL (ref 4.5–5.6)

## 2021-10-21 MED ORDER — POLYETHYLENE GLYCOL 3350 17 G PO PACK
17.0000 g | PACK | Freq: Every day | ORAL | Status: DC | PRN
  Administered 2021-10-22: 17 g via ORAL
  Filled 2021-10-21: qty 1

## 2021-10-21 MED ORDER — BICTEGRAVIR-EMTRICITAB-TENOFOV 50-200-25 MG PO TABS
1.0000 | ORAL_TABLET | Freq: Every day | ORAL | Status: DC
  Administered 2021-10-21 – 2021-10-22 (×2): 1 via ORAL
  Filled 2021-10-21 (×2): qty 1

## 2021-10-21 MED ORDER — CHLORHEXIDINE GLUCONATE CLOTH 2 % EX PADS
6.0000 | MEDICATED_PAD | Freq: Every day | CUTANEOUS | Status: DC
  Administered 2021-10-22: 6 via TOPICAL

## 2021-10-21 MED ORDER — DOXERCALCIFEROL 4 MCG/2ML IV SOLN
8.0000 ug | INTRAVENOUS | Status: DC
  Administered 2021-10-22: 8 ug via INTRAVENOUS
  Filled 2021-10-21 (×2): qty 4

## 2021-10-21 MED ORDER — DARBEPOETIN ALFA 60 MCG/0.3ML IJ SOSY
60.0000 ug | PREFILLED_SYRINGE | INTRAMUSCULAR | Status: DC
  Administered 2021-10-22: 60 ug via INTRAVENOUS
  Filled 2021-10-21 (×2): qty 0.3

## 2021-10-21 NOTE — Progress Notes (Signed)
PROGRESS NOTE    Richard [name]  WIO:[id number] DOB: [birth year] DOA: 10/19/2021 PCP: [name], MD    Chief Complaint  Patient presents with   Fall   Level 2 - On thinners    Brief Narrative: Richard [name] is a 66 y.o. 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  Bp parameters have improved.   Assessment & Plan:   Principal Problem:   Hypertensive crisis Active Problems:   Tobacco abuse   Hyperlipidemia   DM2 (diabetes mellitus, type 2) ([facility])   History of cerebrovascular accident (CVA) with residual deficit   Seizure ([place])   ESRD on hemodialysis ([place])   Hypermagnesemia   Prolonged QT interval   Allergic rhinitis  Hypertensive Crisis:  Resolved. Titrated off NITRO gtt.  Echocardiogram showed worsening LVEF of 45 to 50%. The left ventricle demonstrates regional wall motion abnormalities. Moderate left ventricular hypertrophy. Left ventricular diastolic parameters are consistent with grade 1 diastolic dysfunction.  Cardiology consulted for wall motion abnormalities, to see if he needs ischemic work up.  Meanwhile continue with coreg 12.5 mg BID, clonidine 0.1 mg TID.   EKG shows NSR with LVH, prolonged Qtc.  EKG shows improvement in Qtc Pt denies any chest pain.    Syncope:  - unclear etiology.  Monitor for arrhythmias. Differentials include Seizures, vs ischemic event.  CT head showed Old lacunar infarcts are seen in the left basal ganglia and periventricular region. No significant interval changes are noted since 08/09/2020. - EEG is negative  for active seizures.  - no arrhythmias.  - carotid duplex does not show significant stenosis.    ESRD on HD:  Nephrology on board. Plan for HD in amm    H/o CVA:  With residual deficits.  Continue with plavix and statin.     Type 2 DM: with hyperglycemia:  CBG (last 3)  Recent Labs    10/20/21 2114 10/21/21 0600 10/21/21 1129  GLUCAP 171* 117* 101*    Resume SSI.   A1c is 5.2%    Seizures:  Continue with keppra .    BPH Continue with flomax.    GERD;  Stable on protonix.     HIV: Continue with Biktarvy and on Bactrim.     DVT prophylaxis: heparin.  Code Status: full code.  Family Communication: none at bedside.  Disposition:   Status is: inpatient.   The patient will require care spanning > 2 midnights and should be moved to inpatient       Consultants:  None.   Procedures:  Echocardiogram.   Antimicrobials: none.   Subjective:  No chest pain or sob. No nausea, vomiting or abd pain.  Objective: Vitals:   10/21/21 0335 10/21/21 0400 10/21/21 0727 10/21/21 1132  BP: (!) 112/57 130/73 (!) 145/80 (!) 142/68  Pulse: 79 87 90 77  Resp:   18 (!) 21  Temp: 99.1 F (37.3 C) 98.1 F (36.7 C) 98.6 F (37 C) 97.9 F (36.6 C)  TempSrc: Oral  Oral Oral  SpO2: 99%  100% 98%  Weight: 78.3 kg     Height:        Intake/Output Summary (Last 24 hours) at 10/21/2021 1250  Last data filed at 10/21/2021 1136 Gross per 24 hour  Intake 941.07 ml  Output 625 ml  Net 316.07 ml    Filed Weights   10/19/21 2241 10/20/21 0415 10/21/21 0335  Weight: 77.8 kg 77 kg 78.3 kg    Examination:  General exam: Appears calm and comfortable  Respiratory system: Clear to auscultation. Respiratory effort normal. Cardiovascular system: S1 & S2 heard, RRR. No JVD,  Gastrointestinal system: Abdomen is nondistended, soft and nontender. . Normal bowel sounds heard. Central nervous system: Alert and oriented. No focal neurological deficits. Extremities:  Symmetric 5 x 5 power. Skin: No rashes, lesions or ulcers Psychiatry: Mood & affect appropriate.      Data Reviewed: I have personally reviewed following labs and imaging studies  CBC: Recent Labs  Lab 10/19/21 1300 10/20/21 0915 10/21/21 0544  WBC 6.9 6.6 7.0  NEUTROABS 4.1 3.8  --   HGB 11.6* 11.6* 10.1*  HCT 37.3* 37.5* 31.2*  MCV 101.9* 98.7 98.4  PLT 176 236 198     Basic Metabolic Panel: Recent Labs  Lab 10/19/21 1300 10/20/21 0915 10/21/21 0544  NA 137 142 140  K 4.7 4.5 4.1  CL 98 100 99  CO2 26 28 26   GLUCOSE 164* 114* 131*  BUN 37* 43* 48*  CREATININE 9.60* 11.09* 11.78*  CALCIUM 8.3* 8.5* 8.2*  MG 3.9* 4.1*  --   PHOS  --  5.8*  --      GFR: Estimated Creatinine Clearance: 7 mL/min (A) (by C-G formula based on SCr of 11.78 mg/dL (H)).  Liver Function Tests: Recent Labs  Lab 10/19/21 1300 10/20/21 0915  AST 18 15  ALT 9 11  ALKPHOS 110 94  BILITOT 0.4 0.5  PROT 6.5 7.3  ALBUMIN 3.1* 3.3*     CBG: Recent Labs  Lab 10/20/21 1127 10/20/21 1632 10/20/21 2114 10/21/21 0600 10/21/21 1129  GLUCAP 191* 118* 171* 117* 101*      Recent Results (from the past 240 hour(s))  Resp Panel by RT-PCR (Flu A&B, Covid) Nasopharyngeal Swab     Status: None   Collection Time: 10/19/21  2:29 PM   Specimen: Nasopharyngeal Swab; Nasopharyngeal(NP) swabs in vial transport medium  Result Value Ref Range Status   SARS Coronavirus 2 by RT PCR NEGATIVE NEGATIVE Final    Comment: (NOTE) SARS-CoV-2 target nucleic acids are NOT DETECTED.  The SARS-CoV-2 RNA is generally detectable in upper respiratory specimens during the acute phase of infection. The lowest concentration of SARS-CoV-2 viral copies this assay can detect is 138 copies/mL. A negative result does not preclude SARS-Cov-2 infection and should not be used as the sole basis for treatment or other patient management decisions. A negative result may occur with  improper specimen  collection/handling, submission of specimen other than nasopharyngeal swab, presence of viral mutation(s) within the areas targeted by this assay, and inadequate number of viral copies(<138 copies/mL). A negative result must be combined with clinical observations, patient history, and epidemiological information. The expected result is Negative.  Fact Sheet for Patients:  EntrepreneurPulse.com.au  Fact Sheet for Healthcare Providers:  IncredibleEmployment.be  This test is no t yet approved or cleared by the [place] FDA and  has been authorized for detection and/or diagnosis of SARS-CoV-2 by FDA under an Emergency Use Authorization (EUA). This EUA will remain  in effect (meaning this test can be used) for the duration of the COVID-19 declaration under Section 564(b)(1) of the Act, 21 U.S.C.section 360bbb-3(b)(1), unless the authorization is terminated  or revoked sooner.       Influenza A by PCR NEGATIVE NEGATIVE Final   Influenza B by PCR NEGATIVE NEGATIVE Final    Comment: (NOTE) The Xpert Xpress SARS-CoV-2/FLU/RSV plus assay is intended as an aid in the diagnosis of influenza from Nasopharyngeal swab specimens and should not be used as a sole basis for treatment. Nasal washings and aspirates are unacceptable for Xpert Xpress SARS-CoV-2/FLU/RSV testing.  Fact Sheet for Patients: EntrepreneurPulse.com.au  Fact Sheet for Healthcare Providers: IncredibleEmployment.be  This test is not yet approved or cleared by the [place] FDA and has been authorized for detection and/or diagnosis of SARS-CoV-2 by FDA under an Emergency Use Authorization (EUA). This EUA will remain in effect (meaning this test can be used) for the duration of the COVID-19 declaration under Section 564(b)(1) of the Act, 21 U.S.C. section 360bbb-3(b)(1), unless the authorization is terminated or revoked.  Performed at [facility], [place] [address], [place], [place] [address]           Radiology Studies: CT HEAD WO CONTRAST  Result Date: 10/19/2021 CLINICAL DATA:  Trauma, patient on anticoagulation regimen. EXAM: CT HEAD WITHOUT CONTRAST TECHNIQUE: Contiguous axial images were obtained from the base of the skull through the vertex without intravenous contrast. RADIATION DOSE REDUCTION: This exam was performed according to the departmental dose-optimization program which includes automated exposure control, adjustment of the mA and/or kV according to patient size and/or use of iterative reconstruction technique. COMPARISON:  08/09/2020 FINDINGS: Brain: No acute intracranial findings are seen. Cortical sulci are prominent. There is decreased density in the periventricular and subcortical white matter. There are small old lacunar infarcts in the left basal ganglia and left periventricular region. Vascular: There are scattered arterial calcifications. Skull: No fracture is seen. Sinuses/Orbits: There is mucosal thickening in the frontal and ethmoid sinuses. Other: None IMPRESSION: No acute intracranial findings are seen in noncontrast CT brain. Atrophy. Small-vessel disease. Old lacunar infarcts are seen in the left basal ganglia and periventricular region. No significant interval changes are noted since 08/09/2020. There is fluid density in right side of frontal sinus and right ethmoid sinus, possibly sinusitis. Electronically Signed   By: Elmer Picker M.D.   On: 10/19/2021 13:36   DG Pelvis Portable  Result Date: 10/19/2021 CLINICAL DATA:  Fall. EXAM: PORTABLE PELVIS 1-2 VIEWS COMPARISON:  December 21, 2020. FINDINGS: There is no evidence of pelvic fracture or diastasis. No pelvic bone lesions are seen. IMPRESSION: Negative. Electronically Signed   By: Marijo Conception M.D.   On: 10/19/2021 13:12   DG Chest Port 1 View  Result Date: 10/19/2021 CLINICAL DATA:  Fall. EXAM: PORTABLE CHEST 1 VIEW COMPARISON:   June 25, 2021. FINDINGS: Stable cardiomediastinal silhouette. Both lungs are clear. The visualized skeletal structures are unremarkable. IMPRESSION: No active disease. Electronically Signed   By: Marijo Conception M.D.   On: 10/19/2021 13:11   EEG adult  Result Date: 10/19/2021 Lora Havens, MD     10/19/2021  5:01 PM Patient Name: Athan Casalino MRN: [id number] Epilepsy Attending: Lora Havens Referring Physician/Provider: Karmen Bongo, MD Date: 10/19/2021 Duration: 22.26 mins Patient history: 66yo m with h/o seizures who presented after fall. EEG to evaluate for seizure. Level of alertness: Awake, drowsy AEDs during EEG study: LEV Technical aspects: This EEG study was done with scalp electrodes positioned according to the 10-20 International system of electrode placement. Electrical activity was acquired at a sampling rate of 500Hz  and reviewed with a  high frequency filter of 70Hz  and a low frequency filter of 1Hz . EEG data were recorded continuously and digitally stored. Description: The posterior dominant rhythm consists of 7.5 Hz activity of moderate voltage (25-35 uV) seen predominantly in posterior head regions, symmetric and reactive to eye opening and eye closing. Drowsiness was characterized by attenuation of the posterior background. rhythm.Hyperventilation and photic stimulation were not performed.   IMPRESSION: This study is within normal limits. No seizures or epileptiform discharges were seen throughout the recording. Lora Havens   ECHOCARDIOGRAM COMPLETE  Result Date: 10/20/2021    ECHOCARDIOGRAM REPORT   Patient Name:   [facility] Date of Exam: 10/20/2021 Medical Rec #:  [id number]             Height:       72.0 in Accession #:    [id number]            Weight:       169.8 lb Date of Birth:  10-Jul-1956             BSA:          1.987 m Patient Age:    64 years              BP:           170/82 mmHg Patient Gender: M                     HR:           87 bpm. Exam  Location:  Inpatient Procedure: 2D Echo, Cardiac Doppler and Color Doppler Indications:    R55 Syncope  History:        Patient has prior history of Echocardiogram examinations, most                 recent 02/17/2021. Stroke; Risk Factors:Hypertension and                 Diabetes.  Sonographer:    Bernadene Person RDCS Referring Phys: [id number] [place]  1. Left ventricular ejection fraction, by estimation, is 45 to 50%. The left ventricle has mildly decreased function. The left ventricle demonstrates regional wall motion abnormalities (see scoring diagram/findings for description). There is moderate left ventricular hypertrophy. Left ventricular diastolic parameters are consistent with Grade I diastolic dysfunction (impaired relaxation).  2. Right ventricular systolic function is normal. The right ventricular size is normal.  3. The mitral valve is normal in structure. No evidence of mitral valve regurgitation. No evidence of mitral stenosis.  4. The aortic valve is tricuspid. There is mild calcification of the aortic valve. Aortic valve regurgitation is not visualized. Aortic valve sclerosis is present, with no evidence of aortic valve stenosis.  5. The inferior vena cava is normal in size with greater than 50% respiratory variability, suggesting right atrial pressure of 3 mmHg. Comparison(s): Prior images reviewed side by side. FINDINGS  Left Ventricle: Left ventricular ejection fraction, by estimation, is 45 to 50%. The left ventricle has mildly decreased function. The left ventricle demonstrates regional wall motion abnormalities. The left ventricular internal cavity size was normal in size. There is moderate left ventricular hypertrophy. Left ventricular diastolic parameters are consistent with Grade I diastolic dysfunction (impaired relaxation).  LV Wall Scoring: The inferior wall is akinetic. Right Ventricle: The right ventricular size is normal. No increase in right ventricular wall  thickness. Right ventricular systolic function is normal. Left Atrium: Left atrial size was normal in size.  Right Atrium: Right atrial size was normal in size. Pericardium: There is no evidence of pericardial effusion. Mitral Valve: The mitral valve is normal in structure. No evidence of mitral valve regurgitation. No evidence of mitral valve stenosis. Tricuspid Valve: The tricuspid valve is normal in structure. Tricuspid valve regurgitation is not demonstrated. No evidence of tricuspid stenosis. Aortic Valve: The aortic valve is tricuspid. There is mild calcification of the aortic valve. Aortic valve regurgitation is not visualized. Aortic valve sclerosis is present, with no evidence of aortic valve stenosis. Pulmonic Valve: The pulmonic valve was normal in structure. Pulmonic valve regurgitation is not visualized. No evidence of pulmonic stenosis. Aorta: The aortic root is normal in size and structure. Venous: The inferior vena cava is normal in size with greater than 50% respiratory variability, suggesting right atrial pressure of 3 mmHg. IAS/Shunts: No atrial level shunt detected by color flow Doppler.  LEFT VENTRICLE PLAX 2D LVIDd:         5.00 cm LVIDs:         3.70 cm LV PW:         1.40 cm LV IVS:        1.30 cm LVOT diam:     2.10 cm LV SV:         61 LV SV Index:   31 LVOT Area:     3.46 cm  LV Volumes (MOD) LV vol d, MOD A2C: 110.0 ml LV vol d, MOD A4C: 111.0 ml LV vol s, MOD A2C: 52.1 ml LV vol s, MOD A4C: 56.9 ml LV SV MOD A2C:     57.9 ml LV SV MOD A4C:     111.0 ml LV SV MOD BP:      59.4 ml RIGHT VENTRICLE RV S prime:     9.67 cm/s TAPSE (M-mode): 2.1 cm LEFT ATRIUM           Index        RIGHT ATRIUM           Index LA diam:      3.70 cm 1.86 cm/m   RA Area:     14.10 cm LA Vol (A2C): 61.3 ml 30.85 ml/m  RA Volume:   29.10 ml  14.64 ml/m LA Vol (A4C): 44.1 ml 22.19 ml/m  AORTIC VALVE LVOT Vmax:   89.70 cm/s LVOT Vmean:  61.600 cm/s LVOT VTI:    0.175 m  AORTA Ao Root diam: 3.50 cm Ao Asc diam:   3.40 cm MITRAL VALVE MV Area (PHT): 6.43 cm     SHUNTS MV Decel Time: 118 msec     Systemic VTI:  0.18 m MV E velocity: 81.20 cm/s   Systemic Diam: 2.10 cm MV A velocity: 151.00 cm/s MV E/A ratio:  0.54 Candee Furbish MD Electronically signed by Candee Furbish MD Signature Date/Time: 10/20/2021/11:20:39 AM    Final    VAS US CAROTID  Result Date: 10/20/2021 Carotid Arterial Duplex Study Patient Name:  [facility]  Date of Exam:   10/20/2021 Medical Rec #: [id number]              Accession #:    [id number] Date of Birth: 1956/04/19              Patient Gender: M Patient Age:   30 years Exam Location:  [facility] Procedure:      VAS US CAROTID Referring Phys: Babs Bertin --------------------------------------------------------------------------------  Indications:       Syncope. Risk Factors:  Hypertension, hyperlipidemia, Diabetes. Limitations        Today's exam was limited due to the high bifurcation of the                    carotid, the body habitus of the patient and the patient's                    respiratory variation. Comparison Study:  No prior studies. Performing Technologist: Oliver Hum RVT  Examination Guidelines: A complete evaluation includes B-mode imaging, spectral Doppler, color Doppler, and power Doppler as needed of all accessible portions of each vessel. Bilateral testing is considered an integral part of a complete examination. Limited examinations for reoccurring indications may be performed as noted.  Right Carotid Findings: +----------+--------+--------+--------+-----------------------+--------+             PSV cm/s EDV cm/s Stenosis Plaque Description      Comments  +----------+--------+--------+--------+-----------------------+--------+  CCA Prox   77       11                smooth and heterogenous           +----------+--------+--------+--------+-----------------------+--------+  CCA Distal 57       11                smooth and heterogenous            +----------+--------+--------+--------+-----------------------+--------+  ICA Prox   78       11                smooth and heterogenous           +----------+--------+--------+--------+-----------------------+--------+  ICA Distal 44       11                                        tortuous  +----------+--------+--------+--------+-----------------------+--------+  ECA        29       3                                                   +----------+--------+--------+--------+-----------------------+--------+ +----------+--------+-------+--------+-------------------+             PSV cm/s EDV cms Describe Arm Pressure (mmHG)  +----------+--------+-------+--------+-------------------+  Subclavian 100                                            +----------+--------+-------+--------+-------------------+ +---------+--------+--+--------+-+----------------------------+  Vertebral PSV cm/s 31 EDV cm/s 5 Antegrade and High resistant  +---------+--------+--+--------+-+----------------------------+  Left Carotid Findings: +----------+--------+--------+--------+-----------------------+--------+             PSV cm/s EDV cm/s Stenosis Plaque Description      Comments  +----------+--------+--------+--------+-----------------------+--------+  CCA Prox   70       11                smooth and heterogenous           +----------+--------+--------+--------+-----------------------+--------+  CCA Distal 67       11                smooth and heterogenous           +----------+--------+--------+--------+-----------------------+--------+  ICA Prox   83       12                smooth and heterogenous           +----------+--------+--------+--------+-----------------------+--------+  ICA Distal 43       9                                         tortuous  +----------+--------+--------+--------+-----------------------+--------+  ECA        62       5                                                    +----------+--------+--------+--------+-----------------------+--------+ +----------+--------+--------+--------+-------------------+             PSV cm/s EDV cm/s Describe Arm Pressure (mmHG)  +----------+--------+--------+--------+-------------------+  Subclavian 161                                             +----------+--------+--------+--------+-------------------+ +---------+--------+--+--------+-+----------------------------+  Vertebral PSV cm/s 23 EDV cm/s 5 Antegrade and High resistant  +---------+--------+--+--------+-+----------------------------+   Summary: Right Carotid: Velocities in the right ICA are consistent with a 1-39% stenosis. Left Carotid: Velocities in the left ICA are consistent with a 1-39% stenosis. Vertebrals: Bilateral vertebral arteries demonstrate high resistant flow. *See table(s) above for measurements and observations.  Electronically signed by Servando Snare MD on 10/20/2021 at 3:51:47 PM.    Final         Scheduled Meds:  bictegravir-emtricitabine-tenofovir AF  1 tablet Oral Daily   calcium acetate (Phos Binder)  1,334 mg Oral TID WC   carvedilol  12.5 mg Oral BID WC   [START ON 10/22/2021] Chlorhexidine Gluconate Cloth  6 each Topical Q0600   cloNIDine  0.1 mg Oral TID   clopidogrel  75 mg Oral Daily   [START ON 10/22/2021] darbepoetin (ARANESP) injection - DIALYSIS  60 mcg Intravenous Q Tue-HD   docusate sodium  100 mg Oral Daily   doxepin  75 mg Oral QHS   [START ON 10/22/2021] doxercalciferol  8 mcg Intravenous Q T,Th,Sa-HD   heparin injection (subcutaneous)  5,000 Units Subcutaneous Q8H   insulin aspart  0-6 Units Subcutaneous TID WC   levETIRAcetam  500 mg Oral BID   pantoprazole  40 mg Oral Daily   pravastatin  80 mg Oral QHS   sulfamethoxazole-trimethoprim  1 tablet Oral Q M,W,F   tamsulosin  0.4 mg Oral Daily   Continuous Infusions:  nitroGLYCERIN Stopped (10/21/21 0140)     LOS: 1 day        Hosie Poisson, MD [facility]   To  contact the attending provider between 7A-7P or the covering provider during after hours 7P-7A, please log into the web site [email or web address] and access using universal [place] password for that web site. If you do not have the password, please call the hospital operator.  10/21/2021, 12:50 PM

## 2021-10-21 NOTE — Evaluation (Signed)
Physical Therapy Evaluation Patient Details Name: Richard [name] MRN: [id number] DOB: 03/01/1956 Today's Date: 10/21/2021  History of Present Illness  Pt adm 1/28 with syncope and hypertensive crisis. EEG and imaging wiht no acute changes. PMH - CVA with residual rt side weakness, esrd on HD, HIV, HTN, DM, peripheral neuropathy, and seizure.   Clinical Impression  Pt in bed upon arrival of PT, agreeable to evaluation at this time. Prior to admission the pt was living at [facility] where he was assisted with stand-pivot transfers from bed-WC and [place], but was then independent with mobility in [facility] around the facility. The pt now presents with limitations in functional mobility, strength, power, seated stability, and activity tolerance, and will continue to benefit from skilled PT to address these deficits. The pt required modA to complete transition to sitting EOB, and then required min-modA to maintain sitting upright or minG when propped in a semi-sidelying position. The pt was unable to tolerate seated position for >3-5 min and then required increased assist to complete rolling for pericare. The pt is highly motivated to progress to prior level of mobility and transfers, will benefit from continued skilled PT during admission and at SNF following d/c.         Recommendations for follow up therapy are one component of a multi-disciplinary discharge planning process, led by the attending physician.  Recommendations may be updated based on patient status, additional functional criteria and insurance authorization.  Follow Up Recommendations Skilled nursing-short term rehab (<3 hours/day)    Assistance Recommended at Discharge Frequent or constant Supervision/Assistance  Patient can return home with the following  Two people to help with walking and/or transfers;A lot of help with bathing/dressing/bathroom;Assistance with feeding;Direct supervision/assist for medications management;Assist for  transportation;Help with stairs or ramp for entrance    Equipment Recommendations None recommended by PT  Recommendations for Other Services       Functional Status Assessment Patient has had a recent decline in their functional status and demonstrates the ability to make significant improvements in function in a reasonable and predictable amount of time.     Precautions / Restrictions Precautions Precautions: Fall Precaution Comments: residual R hemiparesis Restrictions Weight Bearing Restrictions: No      Mobility  Bed Mobility Overal bed mobility: Needs Assistance Bed Mobility: Rolling, Sidelying to Sit, Sit to Sidelying Rolling: Min assist Sidelying to sit: Mod assist, HOB elevated     Sit to sidelying: Mod assist, Max assist General bed mobility comments: minA to roll with pt attempting to assist with UE and LLE as able. assist to manage RLE for OOB and return to bed. pt assisting with RUE.    Transfers                   General transfer comment: deferred due to pt fatigue and inability to maintain static sitting EOB due to fatigue       Modified Rankin (Stroke Patients Only) Modified Rankin (Stroke Patients Only) Pre-Morbid Rankin Score: Severe disability Modified Rankin: Severe disability     Balance                                             Pertinent Vitals/Pain Pain Assessment Pain Assessment: No/denies pain    Home Living Family/patient expects to be discharged to:: Skilled nursing facility  Prior Function Prior Level of Function : Needs assist       Physical Assist : Mobility (physical) Mobility (physical): Bed mobility;Transfers   Mobility Comments: pt able to stand and pivot with assist, uses WC at facility for mobility ADLs Comments: assist with bathing in bed, dressing, and grooming     Hand Dominance   Dominant Hand: Right (does everything with L after stroke)     Extremity/Trunk Assessment   Upper Extremity Assessment Upper Extremity Assessment: Generalized weakness;RUE deficits/detail RUE Deficits / Details: residual R hemiparesis    Lower Extremity Assessment Lower Extremity Assessment: RLE deficits/detail;Generalized weakness RLE Deficits / Details: pt with little to no trace movements of RLE (at toes, ankle, knee, or hip)    Cervical / Trunk Assessment Cervical / Trunk Assessment: Normal  Communication   Communication: No difficulties  Cognition Arousal/Alertness: Awake/alert Behavior During Therapy: Flat affect Overall Cognitive Status: Within Functional Limits for tasks assessed                                 General Comments: pt with slowed processing and response, benefits from cues for technique and sequencing. able to explain assist levels needed for all mobility        General Comments General comments (skin integrity, edema, etc.): BP elevated during session with pt reporting signifiant weakness. 136/125 (130) in sitting and 141/85 (164) supine after session    Exercises     Assessment/Plan    PT Assessment Patient needs continued PT services  PT Problem List Decreased strength;Decreased range of motion;Decreased activity tolerance;Decreased balance;Decreased mobility;Decreased coordination;Decreased safety awareness       PT Treatment Interventions DME instruction;Functional mobility training;Therapeutic activities;Therapeutic exercise;Gait training;Neuromuscular re-education;Balance training;Patient/family education    PT Goals (Current goals can be found in the Care Plan section)  Acute Rehab PT Goals Patient Stated Goal: to be able to pivot to [facility] PT Goal Formulation: With patient Time For Goal Achievement: 11/05/21 Potential to Achieve Goals: Good    Frequency Min 2X/week        AM-PAC PT "6 Clicks" Mobility  Outcome Measure Help needed turning from your back to your side while in a flat  bed without using bedrails?: A Lot Help needed moving from lying on your back to sitting on the side of a flat bed without using bedrails?: Total Help needed moving to and from a bed to a chair (including a wheelchair)?: Total Help needed standing up from a chair using your arms (e.g., wheelchair or bedside chair)?: Total Help needed to walk in hospital room?: Total Help needed climbing 3-5 steps with a railing? : Total 6 Click Score: 7    End of Session   Activity Tolerance: Patient limited by fatigue Patient left: in bed;with call bell/phone within reach;with bed alarm set Nurse Communication: Mobility status PT Visit Diagnosis: Other abnormalities of gait and mobility (R26.89);Muscle weakness (generalized) (M62.81)    Time: 7989-2119 PT Time Calculation (min) (ACUTE ONLY): 27 min   Charges:   PT Evaluation $PT Eval Moderate Complexity: 1 Mod PT Treatments $Therapeutic Activity: 8-22 mins        West Carbo, PT, DPT   Acute Rehabilitation Department Pager #: [phone]  Sandra Cockayne 10/21/2021, 2:59 PM

## 2021-10-21 NOTE — Consult Note (Addendum)
Cardiology Consultation:   Patient ID: Richard [name] MRN: [id number]; DOB: 03/05/1956  Admit date: 10/19/2021 Date of Consult: 10/21/2021  PCP:  [name], MD   [name] Providers Cardiologist:  None   [place])  Patient Profile:   Richard [name] is a 66 y.o. male with a hx of DM, HTN, GERD, ESRD on HD, CVA, HIV, seizures who is being seen 10/21/2021 for [name] of abnormal echo at [name]  History of Present Illness:   [name] is a 66 yo male with PMH noted above. He has not been followed by cardiology in Richard past. Currently lives at a [name], uses a wheelchair for mobility, he is able to wheel himself around [name] without assistance. He denies any chest pain, shortness of breath, light-headedness or dizziness prior to admission.   Presented to [name] on 10/19/21 after a fall at [name]. He reports remembering sitting in his wheelchair outside smoking a cigarette, and then woke up on Richard way to [name]. He denies any complaints prior to that episode. On arrival to [name] he was alert and oriented without complaints.   In [name] his labs showed sodium 137, potassium 4.7, creatinine 1.6, magnesium 3.9, albumin 3.1, WBC 6.9, hemoglobin 11.6, TSH 1.5, hemoglobin A1c 5.2.  Chest x-ray negative, pelvis x-ray negative.  CT head negative for acute finding, old lunar infarcts.  EKG sinus rhythm, 74 bpm, LVH, nonspecific changes, prolonged QT interval.  COVID/flu PCR negative.  Blood pressures were noted to be severely elevated greater than 606 systolic on arrival.  He was started on IV nitro as well as resumed on home antihypertensives.  Admitted to internal medicine for further management.   As part of work-up he underwent 2D echocardiogram which showed LVEF of 45 to 50%, moderate LVH, grade 1 diastolic dysfunction, normal RV, inferior wall akinesis.  In talking with Richard patient he reports usually gets all of his medications at [name] and is compliant with his HD sessions. Missed morning meds/HD on admission day. He has never had chest pain.  He was able to be weaned from IV nitro and resumed on coreg 12.100m BID, along with clonidine 0.167mTID, blood pressures have improved. EEG was negative. No concerning arrhythmias on telemetry thus far.  Past Medical History:  Diagnosis Date   COVID-19 05/16/2020   Diabetes mellitus, type II, insulin dependent ([facility]   ESRD on hemodialysis ([facility]12/05/2021   GERD (gastroesophageal reflux disease)    HIV (human immunodeficiency virus infection) ([facility]   Hypertension    Meningoencephalitis    Neuropathy in diabetes ([facility]   bilat feet   Ruptured lumbar disc    Seizure disorder ([facility]   Stroke ([facility]   residual right sided weakness (arm>leg)    Past Surgical History:  Procedure Laterality Date   AV FISTULA PLACEMENT Left 04/25/2020   Procedure: LEFT ARM BRACHIOCEPHALIC ARTERIOVENOUS (AV) FISTULA CREATION;  Surgeon: EaRosetta PosnerMD;  Location: [facility] OR;  Service: Vascular;  Laterality: Left;   IR FLUORO GUIDE CV LINE RIGHT  04/13/2020   IR REMOVAL TUN CV CATH W/O FL  01/14/2021   IR [place] VASC ACCESS RIGHT  04/13/2020   KNEE ARTHROSCOPY Bilateral      Home Medications:  Prior to Admission medications   Medication Sig Start Date End Date Taking? Authorizing Provider  acetaminophen (TYLENOL) 325 MG tablet Take 650 mg by mouth every 6 (  six) hours as needed for moderate pain, fever or mild pain.   Yes [provider]  bictegravir-emtricitabine-tenofovir AF (BIKTARVY) 50-200-25 MG TABS tablet Take 1 tablet by mouth daily. 05/01/20  Yes Oswald Hillock, MD  calcium acetate, Phos Binder, (PHOSLYRA) 667 MG/5ML SOLN Take 1,334 mg by mouth 3 (three) times daily with meals.   Yes [provider]  carvedilol (COREG) 12.5 MG tablet Take 12.5 mg by mouth in Richard morning and at bedtime.   Yes [provider]  cetirizine (ZYRTEC) 10 MG tablet Take 10 mg by  mouth daily.   Yes [provider]  cloNIDine (CATAPRES) 0.1 MG tablet Take 1 tablet (0.1 mg total) by mouth 3 (three) times daily. 02/21/21  Yes Hosie Poisson, MD  clopidogrel (PLAVIX) 75 MG tablet Take 1 tablet (75 mg total) by mouth daily. 03/07/20  Yes Koberlein, Junell C, MD  docusate sodium (COLACE) 100 MG capsule Take 100 mg by mouth daily.   Yes [provider]  doxepin (SINEQUAN) 75 MG capsule Take 75 mg by mouth at bedtime.   Yes [provider]  epoetin alfa-epbx (RETACRIT) 69629 UNIT/ML injection Inject 20,000 Units into Richard skin every Monday.   Yes [provider]  Ergocalciferol 50 MCG (2000 UT) TABS Take 2,000 Units by mouth daily.   Yes [provider]  insulin glargine (LANTUS) 100 unit/mL SOPN Inject 5 Units into Richard skin at bedtime.   Yes [provider]  levETIRAcetam (KEPPRA) 500 MG tablet Take 500 mg by mouth in Richard morning and at bedtime.   Yes [provider]  Lidocaine 3 % CREA Apply 1 application topically See admin instructions. Apply 1 times a day on Tuesday, Thursday and Saturday prior to dialysis   Yes [provider]  Multiple Vitamin (MULTIVITAMIN WITH MINERALS) TABS tablet Take 1 tablet by mouth at bedtime.   Yes [provider]  pantoprazole (PROTONIX) 40 MG tablet TAKE 1 TABLET BY MOUTH EVERY DAY Patient taking differently: 40 mg daily. 01/23/20  Yes Koberlein, Junell C, MD  pravastatin (PRAVACHOL) 80 MG tablet Take 1 tablet (80 mg total) by mouth at bedtime. 03/07/20  Yes Koberlein, [name], MD  sildenafil (VIAGRA) 100 MG tablet Take 100 mg by mouth daily as needed for erectile dysfunction.   Yes [provider]  sulfamethoxazole-trimethoprim (BACTRIM DS) 800-160 MG tablet Take 1 tablet by mouth 3 (three) times a week. Patient taking differently: Take 1 tablet by mouth every Monday, Wednesday, and Friday. Once daily on M-W F continuous 04/23/20  Yes Little Ishikawa, MD   tamsulosin (FLOMAX) 0.4 MG CAPS capsule TAKE 1 CAPSULE BY MOUTH EVERY DAY Patient taking differently: Take 0.4 mg by mouth daily. 03/28/20  Yes Koberlein, [name], MD  traMADol (ULTRAM) 50 MG tablet Take 50 mg by mouth every 8 (eight) hours as needed for moderate pain. 05/01/21  Yes [provider]  triamcinolone cream (KENALOG) 0.1 % Apply 1 application topically 2 (two) times daily. To arms and legs   Yes [provider]  ACCU-CHEK AVIVA PLUS test strip USE 1 strip TO test blood sugar 2 TIMES DAILY TO 3 TIMES DAILY 06/10/19   Koberlein, [name], MD  blood glucose meter kit and supplies KIT Dispense based on patient and insurance preference. Check blood sugar 4 times a day 06/11/18   [name], MD  Cholecalciferol (VITAMIN D) 50 MCG (2000 UT) tablet Take 1 tablet (2,000 Units total) by mouth in Richard morning. Patient not  taking: Reported on 10/19/2021 03/07/20   [name], MD  Darbepoetin Alfa (ARANESP) 60 MCG/0.3ML SOSY injection Inject 0.3 mLs (60 mcg total) into Richard vein every Saturday with hemodialysis. Patient not taking: Reported on 10/19/2021 01/19/21   Bonnielee Haff, MD  Insulin Pen Needle (SURE COMFORT PEN NEEDLES) 31G X 8 MM MISC Use as directed twice daily with Novolog flex pen 02/10/19   [name], MD  Misc. Devices (COMMODE 3-IN-1) MISC Use as directed 02/24/20   [name], MD  Misc. Devices (TRANSFER BENCH) MISC Use as directed 02/24/20   Koberlein, [name], MD  sildenafil (REVATIO) 20 MG tablet Take 1-5 tablets (20-100 mg total) by mouth daily as needed. Patient not taking: Reported on 10/19/2021 09/20/21   [name], MD  TRUEPLUS INSULIN SYRINGE 31G X 5/16" 1 ML MISC Use pen needle with insulin 2 times daily 03/31/19   [name], MD    Inpatient Medications: Scheduled Meds:  bictegravir-emtricitabine-tenofovir AF  1 tablet Oral Daily   calcium acetate (Phos Binder)  1,334 mg Oral TID WC   carvedilol  12.5 mg Oral  BID WC   cloNIDine  0.1 mg Oral TID   clopidogrel  75 mg Oral Daily   docusate sodium  100 mg Oral Daily   doxepin  75 mg Oral QHS   epoetin alfa-epbx (RETACRIT) injection  20,000 Units Subcutaneous Q Mon   heparin injection (subcutaneous)  5,000 Units Subcutaneous Q8H   insulin aspart  0-6 Units Subcutaneous TID WC   levETIRAcetam  500 mg Oral BID   pantoprazole  40 mg Oral Daily   pravastatin  80 mg Oral QHS   sulfamethoxazole-trimethoprim  1 tablet Oral Q M,W,F   tamsulosin  0.4 mg Oral Daily   Continuous Infusions:  nitroGLYCERIN Stopped (10/21/21 0140)   PRN Meds: acetaminophen **OR** acetaminophen, nicotine, traMADol  Allergies:   No Known Allergies  Social History:   Social History   Socioeconomic History   Marital status: Single    Spouse name: Not on file   Number of children: 2   Years of education: 12   Highest education level: 12th grade  Occupational History   Occupation: disabled  Tobacco Use   Smoking status: Every Day    Packs/day: 1.00    Years: 35.00    Pack years: 35.00    Types: Cigarettes    Passive exposure: Current   Smokeless tobacco: Never   Tobacco comments:    Currently smoking about 0.5ppd  Vaping Use   Vaping Use: Never used  Substance and Sexual Activity   Alcohol use: No   Drug use: Not Currently    Types: Marijuana   Sexual activity: Not Currently    Comment: declined condoms  Other Topics Concern   Not on file  Social History Narrative   Not on file   Social Determinants of Health   Financial Resource Strain: Low Risk    Difficulty of Paying Living Expenses: Not very hard  Food Insecurity: No Food Insecurity   Worried About Charity fundraiser in Richard Last Year: Never true   Arboriculturist in [place] Last Year: Never true  Transportation Needs: No Transportation Needs   Lack of Transportation (Medical): No   Lack of Transportation (Non-Medical): No  Physical Activity: Inactive   Days of Exercise per Week: 0 days    Minutes of Exercise per Session: 0 min  Stress: Stress Concern Present   Feeling of Stress :  Very much  Social Connections: Socially Isolated   Frequency of Communication with Friends and Family: More than three times a week   Frequency of Social Gatherings with Friends and Family: More than three times a week   Attends Religious Services: Never   Marine scientist or Organizations: No   Attends Music therapist: Never   Marital Status: Divorced  Human resources officer Violence: Not At Risk   Fear of Current or Ex-Partner: No   Emotionally Abused: No   Physically Abused: No   Sexually Abused: No    Family History:    Family History  Problem Relation Age of Onset   Other Mother    Stroke Father    Hypertension Father    Epilepsy Father    Breast cancer Sister    Stroke Brother    Heart attack Brother    Diabetes Sister      ROS:  Please see Richard history of present illness.   All other ROS reviewed and negative.     Physical Exam/Data:   Vitals:   10/21/21 0236 10/21/21 0335 10/21/21 0400 10/21/21 0727  BP: (!) 114/57 (!) 112/57 130/73 (!) 145/80  Pulse: 81 79 87 90  Resp:    18  Temp:  99.1 F (37.3 C) 98.1 F (36.7 C) 98.6 F (37 C)  TempSrc:  Oral  Oral  SpO2:  99%  100%  Weight:  78.3 kg    Height:        Intake/Output Summary (Last 24 hours) at 10/21/2021 1113 Last data filed at 10/21/2021 0846 Gross per 24 hour  Intake 941.07 ml  Output 425 ml  Net 516.07 ml   Last 3 Weights 10/21/2021 10/20/2021 10/19/2021  Weight (lbs) 172 lb 9.9 oz 169 lb 12.1 oz 171 lb 8.3 oz  Weight (kg) 78.3 kg 77 kg 77.8 kg  Some encounter information is confidential and restricted. Go to Review Flowsheets activity to see all data.     Body mass index is 23.41 kg/m.  General:  Chronically ill male, appears older than stated age 68: normal Neck: no JVD Vascular: No carotid bruits; Distal pulses 2+ bilaterally Cardiac:  normal S1, S2; RRR; no murmur  Lungs:   clear to auscultation bilaterally Abd: soft, nontender, no hepatomegaly  Ext: no edema Musculoskeletal:  No deformities, BUE and BLE strength normal and equal. Left upper arm AVF Skin: warm and dry  Neuro:  CNs 2-12 intact, no focal abnormalities noted Psych:  Normal affect   EKG:  Richard EKG was personally reviewed and demonstrates:  sinus rhythm, 74 bpm, LVH, nonspecific changes, prolonged QT interval Telemetry:  Telemetry was personally reviewed and demonstrates:  SR  Relevant CV Studies:  Echo: 10/20/21  IMPRESSIONS     1. Left ventricular ejection fraction, by estimation, is 45 to 50%. Richard  left ventricle has mildly decreased function. Richard left ventricle  demonstrates regional wall motion abnormalities (see scoring  diagram/findings for description). There is moderate  left ventricular hypertrophy. Left ventricular diastolic parameters are  consistent with Grade I diastolic dysfunction (impaired relaxation).   2. Right ventricular systolic function is normal. Richard right ventricular  size is normal.   3. Richard mitral valve is normal in structure. No evidence of mitral valve  regurgitation. No evidence of mitral stenosis.   4. Richard aortic valve is tricuspid. There is mild calcification of Richard  aortic valve. Aortic valve regurgitation is not visualized. Aortic valve  sclerosis is present, with no evidence of  aortic valve stenosis.   5. Richard inferior vena cava is normal in size with greater than 50%  respiratory variability, suggesting right atrial pressure of 3 mmHg.   Comparison(s): Prior images reviewed side by side.   FINDINGS   Left Ventricle: Left ventricular ejection fraction, by estimation, is 45  to 50%. Richard left ventricle has mildly decreased function. Richard left  ventricle demonstrates regional wall motion abnormalities. Richard left  ventricular internal cavity size was normal  in size. There is moderate left ventricular hypertrophy. Left ventricular  diastolic parameters are  consistent with Grade I diastolic dysfunction  (impaired relaxation).      LV Wall Scoring:  Richard inferior wall is akinetic.   Right Ventricle: Richard right ventricular size is normal. No increase in  right ventricular wall thickness. Right ventricular systolic function is  normal.   Left Atrium: Left atrial size was normal in size.   Right Atrium: Right atrial size was normal in size.   Pericardium: There is no evidence of pericardial effusion.   Mitral Valve: Richard mitral valve is normal in structure. No evidence of  mitral valve regurgitation. No evidence of mitral valve stenosis.   Tricuspid Valve: Richard tricuspid valve is normal in structure. Tricuspid  valve regurgitation is not demonstrated. No evidence of tricuspid  stenosis.   Aortic Valve: Richard aortic valve is tricuspid. There is mild calcification  of Richard aortic valve. Aortic valve regurgitation is not visualized. Aortic  valve sclerosis is present, with no evidence of aortic valve stenosis.   Pulmonic Valve: Richard pulmonic valve was normal in structure. Pulmonic valve  regurgitation is not visualized. No evidence of pulmonic stenosis.   Aorta: Richard aortic root is normal in size and structure.   Venous: Richard inferior vena cava is normal in size with greater than 50%  respiratory variability, suggesting right atrial pressure of 3 mmHg.   IAS/Shunts: No atrial level shunt detected by color flow Doppler.   Laboratory Data:  High Sensitivity Troponin:  No results for input(s): TROPONINIHS in Richard last 720 hours.   Chemistry Recent Labs  Lab 10/19/21 1300 10/20/21 0915 10/21/21 0544  NA 137 142 140  K 4.7 4.5 4.1  CL 98 100 99  CO2 _0 GLUCOSE 164* 114* 131*  BUN 37* 43* 48*  CREATININE 9.60* 11.09* 11.78*  CALCIUM 8.3* 8.5* 8.2*  MG 3.9* 4.1*  --   GFRNONAA 6* 5* 4*  ANIONGAP _1 Recent Labs  Lab 10/19/21 1300 10/20/21 0915  PROT 6.5 7.3  ALBUMIN 3.1* 3.3*  AST 18 15  ALT 9 11  ALKPHOS 110 94   BILITOT 0.4 0.5   Lipids  Recent Labs  Lab 10/21/21 0544  CHOL 103  TRIG 80  HDL 41  LDLCALC 46  CHOLHDL 2.5    Hematology Recent Labs  Lab 10/19/21 1300 10/20/21 0915 10/21/21 0544  WBC 6.9 6.6 7.0  RBC 3.66* 3.80* 3.17*  HGB 11.6* 11.6* 10.1*  HCT 37.3* 37.5* 31.2*  MCV 101.9* 98.7 98.4  MCH 31.7 30.5 31.9  MCHC 31.1 30.9 32.4  RDW 17.5* 17.2* 17.0*  PLT 176 236 198   Thyroid  Recent Labs  Lab 10/20/21 0915  TSH 1.575    BNPNo results for input(s): BNP, PROBNP in Richard last 168 hours.  DDimer No results for input(s): DDIMER in Richard last 168 hours.   Radiology/Studies:  CT HEAD WO CONTRAST  Result Date: 10/19/2021 CLINICAL DATA:  Trauma, patient on anticoagulation  regimen. EXAM: CT HEAD WITHOUT CONTRAST TECHNIQUE: Contiguous axial images were obtained from Richard base of Richard skull through Richard vertex without intravenous contrast. RADIATION DOSE REDUCTION: This exam was performed according to Richard departmental dose-optimization program which includes automated exposure control, adjustment of the mA and/or kV according to patient size and/or use of iterative reconstruction technique. COMPARISON:  08/09/2020 FINDINGS: Brain: No acute intracranial findings are seen. Cortical sulci are prominent. There is decreased density in Richard periventricular and subcortical white matter. There are small old lacunar infarcts in Richard left basal ganglia and left periventricular region. Vascular: There are scattered arterial calcifications. Skull: No fracture is seen. Sinuses/Orbits: There is mucosal thickening in Richard frontal and ethmoid sinuses. Other: None IMPRESSION: No acute intracranial findings are seen in noncontrast CT brain. Atrophy. Small-vessel disease. Old lacunar infarcts are seen in Richard left basal ganglia and periventricular region. No significant interval changes are noted since 08/09/2020. There is fluid density in right side of frontal sinus and right ethmoid sinus, possibly sinusitis.  Electronically Signed   By: Elmer Picker M.D.   On: 10/19/2021 13:36   DG Pelvis Portable  Result Date: 10/19/2021 CLINICAL DATA:  Fall. EXAM: PORTABLE PELVIS 1-2 VIEWS COMPARISON:  December 21, 2020. FINDINGS: There is no evidence of pelvic fracture or diastasis. No pelvic bone lesions are seen. IMPRESSION: Negative. Electronically Signed   By: Marijo Conception M.D.   On: 10/19/2021 13:12   DG Chest Port 1 View  Result Date: 10/19/2021 CLINICAL DATA:  Fall. EXAM: PORTABLE CHEST 1 VIEW COMPARISON:  June 25, 2021. FINDINGS: Stable cardiomediastinal silhouette. Both lungs are clear. Richard visualized skeletal structures are unremarkable. IMPRESSION: No active disease. Electronically Signed   By: Marijo Conception M.D.   On: 10/19/2021 13:11   EEG adult  Result Date: 10/19/2021 Lora Havens, MD     10/19/2021  5:01 PM Patient Name: [name] MRN: [id number] Epilepsy Attending: Lora Havens Referring Physician/Provider: Karmen Bongo, MD Date: 10/19/2021 Duration: 22.26 mins Patient history: 66yo m with h/o seizures who presented after fall. EEG to evaluate for seizure. Level of alertness: Awake, drowsy AEDs during EEG study: LEV Technical aspects: This EEG study was done with scalp electrodes positioned according to Richard 10-20 International system of electrode placement. Electrical activity was acquired at a sampling rate of _0  and reviewed with a high frequency filter of _1  and a low frequency filter of _2 . EEG data were recorded continuously and digitally stored. Description: Richard posterior dominant rhythm consists of 7.5 Hz activity of moderate voltage (25-35 uV) seen predominantly in posterior head regions, symmetric and reactive to eye opening and eye closing. Drowsiness was characterized by attenuation of Richard posterior background. rhythm.Hyperventilation and photic stimulation were not performed.   IMPRESSION: This study is within normal limits. No seizures or epileptiform  discharges were seen throughout Richard recording. Lora [facility]ens   ECHOCARDIOGRAM COMPLETE  Result Date: 10/20/2021    ECHOCARDIOGRAM REPORT   Patient Name:   [name] Date of Exam: 10/20/2021 Medical Rec #:  [id number]             Height:       72.0 in Accession #:    [id number]            Weight:       169.8 lb Date of Birth:  [birth year]             BSA:          1.987  m Patient Age:    45 years              BP:           170/82 mmHg Patient Gender: M                     HR:           87 bpm. Exam Location:  Inpatient Procedure: 2D Echo, Cardiac Doppler and Color Doppler Indications:    R55 Syncope  History:        Patient has prior history of Echocardiogram examinations, most                 recent 02/17/2021. Stroke; Risk Factors:Hypertension and                 Diabetes.  Sonographer:    Bernadene Person RDCS Referring Phys: [id number] [place]  1. Left ventricular ejection fraction, by estimation, is 45 to 50%. Richard left ventricle has mildly decreased function. Richard left ventricle demonstrates regional wall motion abnormalities (see scoring diagram/findings for description). There is moderate left ventricular hypertrophy. Left ventricular diastolic parameters are consistent with Grade I diastolic dysfunction (impaired relaxation).  2. Right ventricular systolic function is normal. Richard right ventricular size is normal.  3. Richard mitral valve is normal in structure. No evidence of mitral valve regurgitation. No evidence of mitral stenosis.  4. Richard aortic valve is tricuspid. There is mild calcification of Richard aortic valve. Aortic valve regurgitation is not visualized. Aortic valve sclerosis is present, with no evidence of aortic valve stenosis.  5. Richard inferior vena cava is normal in size with greater than 50% respiratory variability, suggesting right atrial pressure of 3 mmHg. Comparison(s): Prior images reviewed side by side. FINDINGS  Left Ventricle: Left ventricular ejection fraction,  by estimation, is 45 to 50%. Richard left ventricle has mildly decreased function. Richard left ventricle demonstrates regional wall motion abnormalities. Richard left ventricular internal cavity size was normal in size. There is moderate left ventricular hypertrophy. Left ventricular diastolic parameters are consistent with Grade I diastolic dysfunction (impaired relaxation).  LV Wall Scoring: Richard inferior wall is akinetic. Right Ventricle: Richard right ventricular size is normal. No increase in right ventricular wall thickness. Right ventricular systolic function is normal. Left Atrium: Left atrial size was normal in size. Right Atrium: Right atrial size was normal in size. Pericardium: There is no evidence of pericardial effusion. Mitral Valve: Richard mitral valve is normal in structure. No evidence of mitral valve regurgitation. No evidence of mitral valve stenosis. Tricuspid Valve: Richard tricuspid valve is normal in structure. Tricuspid valve regurgitation is not demonstrated. No evidence of tricuspid stenosis. Aortic Valve: Richard aortic valve is tricuspid. There is mild calcification of Richard aortic valve. Aortic valve regurgitation is not visualized. Aortic valve sclerosis is present, with no evidence of aortic valve stenosis. Pulmonic Valve: Richard pulmonic valve was normal in structure. Pulmonic valve regurgitation is not visualized. No evidence of pulmonic stenosis. Aorta: Richard aortic root is normal in size and structure. Venous: Richard inferior vena cava is normal in size with greater than 50% respiratory variability, suggesting right atrial pressure of 3 mmHg. IAS/Shunts: No atrial level shunt detected by color flow Doppler.  LEFT VENTRICLE PLAX 2D LVIDd:         5.00 cm LVIDs:         3.70 cm LV PW:         1.40 cm LV IVS:  1.30 cm LVOT diam:     2.10 cm LV SV:         61 LV SV Index:   31 LVOT Area:     3.46 cm  LV Volumes (MOD) LV vol d, MOD A2C: 110.0 ml LV vol d, MOD A4C: 111.0 ml LV vol s, MOD A2C: 52.1 ml LV vol s, MOD  A4C: 56.9 ml LV SV MOD A2C:     57.9 ml LV SV MOD A4C:     111.0 ml LV SV MOD BP:      59.4 ml RIGHT VENTRICLE RV S prime:     9.67 cm/s TAPSE (M-mode): 2.1 cm LEFT ATRIUM           Index        RIGHT ATRIUM           Index LA diam:      3.70 cm 1.86 cm/m   RA Area:     14.10 cm LA Vol (A2C): 61.3 ml 30.85 ml/m  RA Volume:   29.10 ml  14.64 ml/m LA Vol (A4C): 44.1 ml 22.19 ml/m  AORTIC VALVE LVOT Vmax:   89.70 cm/s LVOT Vmean:  61.600 cm/s LVOT VTI:    0.175 m  AORTA Ao Root diam: 3.50 cm Ao Asc diam:  3.40 cm MITRAL VALVE MV Area (PHT): 6.43 cm     SHUNTS MV Decel Time: 118 msec     Systemic VTI:  0.18 m MV E velocity: 81.20 cm/s   Systemic Diam: 2.10 cm MV A velocity: 151.00 cm/s MV E/A ratio:  0.54 Candee Furbish MD Electronically signed by Candee Furbish MD Signature Date/Time: 10/20/2021/11:20:39 AM    Final    VAS US CAROTID  Result Date: 10/20/2021 Carotid Arterial Duplex Study Patient Name:  Richard [name]  Date of Exam:   10/20/2021 Medical Rec #: [id number]              Accession #:    [id number] Date of Birth: [birth year]              Patient Gender: M Patient Age:   49 years Exam Location:  [facility] Procedure:      VAS US CAROTID Referring Phys: Babs Bertin --------------------------------------------------------------------------------  Indications:       Syncope. Risk Factors:      Hypertension, hyperlipidemia, Diabetes. Limitations        Today's exam was limited due to Richard high bifurcation of Richard                    carotid, Richard body habitus of Richard patient and Richard patient's                    respiratory variation. Comparison Study:  No prior studies. Performing Technologist: Oliver Hum RVT  Examination Guidelines: A complete [name] includes B-mode imaging, spectral Doppler, color Doppler, and power Doppler as needed of all accessible portions of each vessel. Bilateral testing is considered an integral part of a complete examination. Limited examinations for reoccurring  indications may be performed as noted.  Right Carotid Findings: +----------+--------+--------+--------+-----------------------+--------+             PSV cm/s EDV cm/s Stenosis Plaque Description      Comments  +----------+--------+--------+--------+-----------------------+--------+  CCA Prox   77       11                smooth and heterogenous           +----------+--------+--------+--------+-----------------------+--------+  CCA Distal 57       11                smooth and heterogenous           +----------+--------+--------+--------+-----------------------+--------+  ICA Prox   78       11                smooth and heterogenous           +----------+--------+--------+--------+-----------------------+--------+  ICA Distal 44       11                                        tortuous  +----------+--------+--------+--------+-----------------------+--------+  ECA        29       3                                                   +----------+--------+--------+--------+-----------------------+--------+ +----------+--------+-------+--------+-------------------+             PSV cm/s EDV cms Describe Arm Pressure (mmHG)  +----------+--------+-------+--------+-------------------+  Subclavian 100                                            +----------+--------+-------+--------+-------------------+ +---------+--------+--+--------+-+----------------------------+  Vertebral PSV cm/s 31 EDV cm/s 5 Antegrade and High resistant  +---------+--------+--+--------+-+----------------------------+  Left Carotid Findings: +----------+--------+--------+--------+-----------------------+--------+             PSV cm/s EDV cm/s Stenosis Plaque Description      Comments  +----------+--------+--------+--------+-----------------------+--------+  CCA Prox   70       11                smooth and heterogenous           +----------+--------+--------+--------+-----------------------+--------+  CCA Distal 67       11                smooth and  heterogenous           +----------+--------+--------+--------+-----------------------+--------+  ICA Prox   83       12                smooth and heterogenous           +----------+--------+--------+--------+-----------------------+--------+  ICA Distal 43       9                                         tortuous  +----------+--------+--------+--------+-----------------------+--------+  ECA        62       5                                                   +----------+--------+--------+--------+-----------------------+--------+ +----------+--------+--------+--------+-------------------+             PSV cm/s EDV cm/s Describe Arm Pressure (mmHG)  +----------+--------+--------+--------+-------------------+  Subclavian 161                                             +----------+--------+--------+--------+-------------------+ +---------+--------+--+--------+-+----------------------------+  Vertebral PSV cm/s 23 EDV cm/s 5 Antegrade and High resistant  +---------+--------+--+--------+-+----------------------------+   Summary: Right Carotid: Velocities in Richard right ICA are consistent with a 1-39% stenosis. Left Carotid: Velocities in Richard left ICA are consistent with a 1-39% stenosis. Vertebrals: Bilateral vertebral arteries demonstrate high resistant flow. *See table(s) above for measurements and observations.  Electronically signed by Servando Snare MD on 10/20/2021 at 3:51:47 PM.    Final      Assessment and Plan:   Richard [name] is a 66 y.o. male with a hx of DM, HTN, GERD, ESRD on HD, CVA, HIV who is being seen 10/21/2021 for [name] of abnormal echo at [name]  HFrEF: echo this admission shows slight decline in EF to 45-50% with inferior wall akinesis. He denies any cardiac symptoms prior to admission. EKG without ischemic changes. Will review images with MD, but given he has been asymptomatic, would favor conservative management with medical therapy -- on plavix, statin, BB  therapy  Syncope: etiology is unclear, no concerning arrhythmias on telemetry -- CT head was negative  Hypertensive crisis: systolic BPs >614 on admission. Initially started IV nitro, now weaned.  -- on coreg 12.76m BID, clonidine 0.113mTID  ESRD on HD: TTS -- nephrology following -- reports compliance with HD pta  Hx of CVA: on statin, plavix  DM: Hgb A1c 5.2 -- per primary  HLD: on statin  -- LDL 46  HIV: on home medications   Tobacco use: cessation advised   For questions or updates, please contact [email or web address] Please consult [email or web address] for contact info under    Signed, LiReino BellisNP  10/21/2021 11:13 AM  Personally seen and examined. Agree with above.  Echo finding with EF 45 to 50% with inferior wall akinesis noted.  When previously reviewing other echocardiograms, I do believe that although not as clearly visualized, this finding was seen previously.  I would advocate for continued goal-directed medical therapy.  No invasive or ischemic [name].  He is not having any chest discomfort.  Statin therapy.  Continue with BP control which has been challenging.  On exam comfortable laying flat, denies any chest discomfort, regular rate and rhythm.  No other cardiology work-up or follow-up needed.  MaCandee FurbishMD

## 2021-10-21 NOTE — Consult Note (Signed)
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  Brought to the emergency room by EMS 2 days ago after suffering a syncopal event and found to have hypertensive crisis.  It appears that he went to his dialysis treatment on Saturday and left under his dry weight (EDW 89.5, left at 89.2 kg) after running 3 hours of his 4-hour prescribed treatment.  There was no recall of any warning symptoms leading up to the syncopal event and he did not have any associated dizziness/lightheadedness or chest pain prior to the event.  There is no report of head injury or significant fall as he was in his wheelchair when this happened.  Additionally, there was no evidence of seizure activity.  He denies any associated fever, chills, nausea, vomiting or diarrhea.  Hemodialysis prescription: [facility], TTS, 4 hours, 180 dialyzer, BFR 400/DFR 800, EDW 89.5 kg, 2K/2.0 calcium, heparin 4000 units bolus, left upper arm AV fistula, 15 G needle, Hectorol 8 mcg IV q. treatment.  Mircera last dose 09/03/2021 was 50 mcg with hemoglobin 11.3 on 1/24.  Past Medical History:  Diagnosis Date   COVID-19 05/16/2020   Diabetes mellitus, type II, insulin dependent ([place])    ESRD on hemodialysis ([place]) 08/30/2021   GERD (gastroesophageal reflux disease)    HIV (human immunodeficiency virus infection) ([place])    Hypertension    Meningoencephalitis    Neuropathy in diabetes ([place])    bilat feet   Ruptured lumbar disc    Seizure disorder ([place])    Stroke ([place]) 2013   residual right sided weakness (arm>leg)    Past Surgical History:  Procedure Laterality Date   AV FISTULA PLACEMENT  Left 04/25/2020   Procedure: LEFT ARM BRACHIOCEPHALIC ARTERIOVENOUS (AV) FISTULA CREATION;  Surgeon: Rosetta Posner, MD;  Location: [facility] OR;  Service: Vascular;  Laterality: Left;   IR FLUORO GUIDE CV LINE RIGHT  04/13/2020   IR REMOVAL TUN CV CATH W/O FL  01/14/2021   IR US GUIDE VASC ACCESS RIGHT  04/13/2020   KNEE ARTHROSCOPY Bilateral     Family History  Problem Relation Age of Onset   Other Mother    Stroke Father    Hypertension Father    Epilepsy Father    Breast cancer Sister    Stroke Brother    Heart attack Brother    Diabetes Sister     Social History:  reports that he has been smoking cigarettes. He has a 35.00 pack-year smoking history. He has been exposed to tobacco smoke. He has never used smokeless tobacco. He reports that he does not currently use drugs after having used the following drugs: Marijuana. He reports that he does not drink alcohol.  Allergies: No Known Allergies  Medications: I have reviewed the patient's current medications. Scheduled:  bictegravir-emtricitabine-tenofovir AF  1 tablet Oral Daily   calcium acetate (Phos Binder)  1,334 mg Oral TID WC   carvedilol  12.5 mg Oral BID WC   cloNIDine  0.1 mg Oral TID   clopidogrel  75 mg Oral Daily   docusate sodium  100 mg Oral Daily   doxepin  75 mg Oral QHS   heparin injection (subcutaneous)  5,000 Units Subcutaneous Q8H   insulin  aspart  0-6 Units Subcutaneous TID WC   levETIRAcetam  500 mg Oral BID   pantoprazole  40 mg Oral Daily   pravastatin  80 mg Oral QHS   sulfamethoxazole-trimethoprim  1 tablet Oral Q M,W,F   tamsulosin  0.4 mg Oral Daily    BMP Latest Ref Rng & Units 10/21/2021 10/20/2021 10/19/2021  Glucose 70 - 99 mg/dL 131(H) 114(H) 164(H)  BUN 8 - 23 mg/dL 48(H) 43(H) 37(H)  Creatinine 0.61 - 1.24 mg/dL 11.78(H) 11.09(H) 9.60(H)  Sodium 135 - 145 mmol/L 140 142 137  Potassium 3.5 - 5.1 mmol/L 4.1 4.5 4.7  Chloride 98 - 111 mmol/L 99 100 98  CO2 22 - 32 mmol/L 26 28 26   Calcium 8.9 - 10.3  mg/dL 8.2(L) 8.5(L) 8.3(L)   CBC Latest Ref Rng & Units 10/21/2021 10/20/2021 10/19/2021  WBC 4.0 - 10.5 K/uL 7.0 6.6 6.9  Hemoglobin 13.0 - 17.0 g/dL 10.1(L) 11.6(L) 11.6(L)  Hematocrit 39.0 - 52.0 % 31.2(L) 37.5(L) 37.3(L)  Platelets 150 - 400 K/uL 198 236 176     CT HEAD WO CONTRAST  Result Date: 10/19/2021 CLINICAL DATA:  Trauma, patient on anticoagulation regimen. EXAM: CT HEAD WITHOUT CONTRAST TECHNIQUE: Contiguous axial images were obtained from the base of the skull through the vertex without intravenous contrast. RADIATION DOSE REDUCTION: This exam was performed according to the departmental dose-optimization program which includes automated exposure control, adjustment of the mA and/or kV according to patient size and/or use of iterative reconstruction technique. COMPARISON:  08/09/2020 FINDINGS: Brain: No acute intracranial findings are seen. Cortical sulci are prominent. There is decreased density in the periventricular and subcortical white matter. There are small old lacunar infarcts in the left basal ganglia and left periventricular region. Vascular: There are scattered arterial calcifications. Skull: No fracture is seen. Sinuses/Orbits: There is mucosal thickening in the frontal and ethmoid sinuses. Other: None IMPRESSION: No acute intracranial findings are seen in noncontrast CT brain. Atrophy. Small-vessel disease. Old lacunar infarcts are seen in the left basal ganglia and periventricular region. No significant interval changes are noted since 08/09/2020. There is fluid density in right side of frontal sinus and right ethmoid sinus, possibly sinusitis. Electronically Signed   By: Elmer Picker M.D.   On: 10/19/2021 13:36   DG Pelvis Portable  Result Date: 10/19/2021 CLINICAL DATA:  Fall. EXAM: PORTABLE PELVIS 1-2 VIEWS COMPARISON:  December 21, 2020. FINDINGS: There is no evidence of pelvic fracture or diastasis. No pelvic bone lesions are seen. IMPRESSION: Negative. Electronically  Signed   By: Marijo Conception M.D.   On: 10/19/2021 13:12   DG Chest Port 1 View  Result Date: 10/19/2021 CLINICAL DATA:  Fall. EXAM: PORTABLE CHEST 1 VIEW COMPARISON:  June 25, 2021. FINDINGS: Stable cardiomediastinal silhouette. Both lungs are clear. The visualized skeletal structures are unremarkable. IMPRESSION: No active disease. Electronically Signed   By: Marijo Conception M.D.   On: 10/19/2021 13:11   EEG adult  Result Date: 10/19/2021 Lora Havens, MD     10/19/2021  5:01 PM Patient Name: Richard [name] MRN: [id number] Epilepsy Attending: Lora Havens Referring Physician/Provider: Karmen Bongo, MD Date: 10/19/2021 Duration: 22.26 mins Patient history: 66yo m with h/o seizures who presented after fall. EEG to evaluate for seizure. Level of alertness: Awake, drowsy AEDs during EEG study: LEV Technical aspects: This EEG study was done with scalp electrodes positioned according to the 10-20 International system of electrode placement. Electrical activity was acquired at a sampling rate of 500Hz   and reviewed with a high frequency filter of 70Hz  and a low frequency filter of 1Hz . EEG data were recorded continuously and digitally stored. Description: The posterior dominant rhythm consists of 7.5 Hz activity of moderate voltage (25-35 uV) seen predominantly in posterior head regions, symmetric and reactive to eye opening and eye closing. Drowsiness was characterized by attenuation of the posterior background. rhythm.Hyperventilation and photic stimulation were not performed.   IMPRESSION: This study is within normal limits. No seizures or epileptiform discharges were seen throughout the recording. Lora Havens   ECHOCARDIOGRAM COMPLETE  Result Date: 10/20/2021    ECHOCARDIOGRAM REPORT   Patient Name:   [facility] Date of Exam: 10/20/2021 Medical Rec #:  [id number]             Height:       72.0 in Accession #:    [id number]            Weight:       169.8 lb Date of Birth:   01/06/1956             BSA:          1.987 m Patient Age:    57 years              BP:           170/82 mmHg Patient Gender: M                     HR:           87 bpm. Exam Location:  Inpatient Procedure: 2D Echo, Cardiac Doppler and Color Doppler Indications:    R55 Syncope  History:        Patient has prior history of Echocardiogram examinations, most                 recent 02/17/2021. Stroke; Risk Factors:Hypertension and                 Diabetes.  Sonographer:    Bernadene Person RDCS Referring Phys: [id number] [place]  1. Left ventricular ejection fraction, by estimation, is 45 to 50%. The left ventricle has mildly decreased function. The left ventricle demonstrates regional wall motion abnormalities (see scoring diagram/findings for description). There is moderate left ventricular hypertrophy. Left ventricular diastolic parameters are consistent with Grade I diastolic dysfunction (impaired relaxation).  2. Right ventricular systolic function is normal. The right ventricular size is normal.  3. The mitral valve is normal in structure. No evidence of mitral valve regurgitation. No evidence of mitral stenosis.  4. The aortic valve is tricuspid. There is mild calcification of the aortic valve. Aortic valve regurgitation is not visualized. Aortic valve sclerosis is present, with no evidence of aortic valve stenosis.  5. The inferior vena cava is normal in size with greater than 50% respiratory variability, suggesting right atrial pressure of 3 mmHg. Comparison(s): Prior images reviewed side by side. FINDINGS  Left Ventricle: Left ventricular ejection fraction, by estimation, is 45 to 50%. The left ventricle has mildly decreased function. The left ventricle demonstrates regional wall motion abnormalities. The left ventricular internal cavity size was normal in size. There is moderate left ventricular hypertrophy. Left ventricular diastolic parameters are consistent with Grade I diastolic dysfunction  (impaired relaxation).  LV Wall Scoring: The inferior wall is akinetic. Right Ventricle: The right ventricular size is normal. No increase in right ventricular wall thickness. Right ventricular systolic function is normal. Left Atrium: Left atrial size  was normal in size. Right Atrium: Right atrial size was normal in size. Pericardium: There is no evidence of pericardial effusion. Mitral Valve: The mitral valve is normal in structure. No evidence of mitral valve regurgitation. No evidence of mitral valve stenosis. Tricuspid Valve: The tricuspid valve is normal in structure. Tricuspid valve regurgitation is not demonstrated. No evidence of tricuspid stenosis. Aortic Valve: The aortic valve is tricuspid. There is mild calcification of the aortic valve. Aortic valve regurgitation is not visualized. Aortic valve sclerosis is present, with no evidence of aortic valve stenosis. Pulmonic Valve: The pulmonic valve was normal in structure. Pulmonic valve regurgitation is not visualized. No evidence of pulmonic stenosis. Aorta: The aortic root is normal in size and structure. Venous: The inferior vena cava is normal in size with greater than 50% respiratory variability, suggesting right atrial pressure of 3 mmHg. IAS/Shunts: No atrial level shunt detected by color flow Doppler.  LEFT VENTRICLE PLAX 2D LVIDd:         5.00 cm LVIDs:         3.70 cm LV PW:         1.40 cm LV IVS:        1.30 cm LVOT diam:     2.10 cm LV SV:         61 LV SV Index:   31 LVOT Area:     3.46 cm  LV Volumes (MOD) LV vol d, MOD A2C: 110.0 ml LV vol d, MOD A4C: 111.0 ml LV vol s, MOD A2C: 52.1 ml LV vol s, MOD A4C: 56.9 ml LV SV MOD A2C:     57.9 ml LV SV MOD A4C:     111.0 ml LV SV MOD BP:      59.4 ml RIGHT VENTRICLE RV S prime:     9.67 cm/s TAPSE (M-mode): 2.1 cm LEFT ATRIUM           Index        RIGHT ATRIUM           Index LA diam:      3.70 cm 1.86 cm/m   RA Area:     14.10 cm LA Vol (A2C): 61.3 ml 30.85 ml/m  RA Volume:   29.10 ml  14.64  ml/m LA Vol (A4C): 44.1 ml 22.19 ml/m  AORTIC VALVE LVOT Vmax:   89.70 cm/s LVOT Vmean:  61.600 cm/s LVOT VTI:    0.175 m  AORTA Ao Root diam: 3.50 cm Ao Asc diam:  3.40 cm MITRAL VALVE MV Area (PHT): 6.43 cm     SHUNTS MV Decel Time: 118 msec     Systemic VTI:  0.18 m MV E velocity: 81.20 cm/s   Systemic Diam: 2.10 cm MV A velocity: 151.00 cm/s MV E/A ratio:  0.54 Candee Furbish MD Electronically signed by Candee Furbish MD Signature Date/Time: 10/20/2021/11:20:39 AM    Final    VAS US CAROTID  Result Date: 10/20/2021 Carotid Arterial Duplex Study Patient Name:  [facility]  Date of Exam:   10/20/2021 Medical Rec #: [id number]              Accession #:    [id number] Date of Birth: 1956-08-09              Patient Gender: M Patient Age:   68 years Exam Location:  [facility] Procedure:      VAS US CAROTID Referring Phys: Babs Bertin --------------------------------------------------------------------------------  Indications:       Syncope.  Risk Factors:      Hypertension, hyperlipidemia, Diabetes. Limitations        Today's exam was limited due to the high bifurcation of the                    carotid, the body habitus of the patient and the patient's                    respiratory variation. Comparison Study:  No prior studies. Performing Technologist: Oliver Hum RVT  Examination Guidelines: A complete evaluation includes B-mode imaging, spectral Doppler, color Doppler, and power Doppler as needed of all accessible portions of each vessel. Bilateral testing is considered an integral part of a complete examination. Limited examinations for reoccurring indications may be performed as noted.  Right Carotid Findings: +----------+--------+--------+--------+-----------------------+--------+             PSV cm/s EDV cm/s Stenosis Plaque Description      Comments  +----------+--------+--------+--------+-----------------------+--------+  CCA Prox   77       11                smooth and heterogenous            +----------+--------+--------+--------+-----------------------+--------+  CCA Distal 57       11                smooth and heterogenous           +----------+--------+--------+--------+-----------------------+--------+  ICA Prox   78       11                smooth and heterogenous           +----------+--------+--------+--------+-----------------------+--------+  ICA Distal 44       11                                        tortuous  +----------+--------+--------+--------+-----------------------+--------+  ECA        29       3                                                   +----------+--------+--------+--------+-----------------------+--------+ +----------+--------+-------+--------+-------------------+             PSV cm/s EDV cms Describe Arm Pressure (mmHG)  +----------+--------+-------+--------+-------------------+  Subclavian 100                                            +----------+--------+-------+--------+-------------------+ +---------+--------+--+--------+-+----------------------------+  Vertebral PSV cm/s 31 EDV cm/s 5 Antegrade and High resistant  +---------+--------+--+--------+-+----------------------------+  Left Carotid Findings: +----------+--------+--------+--------+-----------------------+--------+             PSV cm/s EDV cm/s Stenosis Plaque Description      Comments  +----------+--------+--------+--------+-----------------------+--------+  CCA Prox   70       11                smooth and heterogenous           +----------+--------+--------+--------+-----------------------+--------+  CCA Distal 67       11                smooth  and heterogenous           +----------+--------+--------+--------+-----------------------+--------+  ICA Prox   83       12                smooth and heterogenous           +----------+--------+--------+--------+-----------------------+--------+  ICA Distal 43       9                                         tortuous   +----------+--------+--------+--------+-----------------------+--------+  ECA        62       5                                                   +----------+--------+--------+--------+-----------------------+--------+ +----------+--------+--------+--------+-------------------+             PSV cm/s EDV cm/s Describe Arm Pressure (mmHG)  +----------+--------+--------+--------+-------------------+  Subclavian 161                                             +----------+--------+--------+--------+-------------------+ +---------+--------+--+--------+-+----------------------------+  Vertebral PSV cm/s 23 EDV cm/s 5 Antegrade and High resistant  +---------+--------+--+--------+-+----------------------------+   Summary: Right Carotid: Velocities in the right ICA are consistent with a 1-39% stenosis. Left Carotid: Velocities in the left ICA are consistent with a 1-39% stenosis. Vertebrals: Bilateral vertebral arteries demonstrate high resistant flow. *See table(s) above for measurements and observations.  Electronically signed by Servando Snare MD on 10/20/2021 at 3:51:47 PM.    Final     Review of Systems  Constitutional:  Negative for activity change, chills, fatigue and fever.  HENT:  Negative for nosebleeds, sore throat, trouble swallowing and voice change.   Eyes:  Negative for pain, redness and visual disturbance.  Respiratory:  Negative for cough, shortness of breath and wheezing.   Cardiovascular:  Negative for chest pain and leg swelling.  Gastrointestinal:  Negative for abdominal pain, blood in stool, diarrhea, nausea and vomiting.  Genitourinary:  Negative for flank pain, hematuria and urgency.  Musculoskeletal:  Positive for gait problem. Negative for arthralgias, back pain and neck stiffness.  Skin:  Negative for rash and wound.  Allergic/Immunologic: Negative for environmental allergies.  Neurological:  Positive for weakness. Negative for dizziness and light-headedness.       Chronic right-sided   Psychiatric/Behavioral:  Negative for hallucinations. The patient is not nervous/anxious.   Blood pressure (!) 142/68, pulse 77, temperature 97.9 F (36.6 C), temperature source Oral, resp. rate (!) 21, height 6' (1.829 m), weight 78.3 kg, SpO2 98 %. Physical Exam Vitals and nursing note reviewed.  Constitutional:      General: He is not in acute distress.    Appearance: Normal appearance. He is obese. He is not ill-appearing.  HENT:     Head: Normocephalic and atraumatic.     Right Ear: External ear normal.     Left Ear: External ear normal.     Nose: Nose normal.     Mouth/Throat:     Mouth: Mucous membranes are moist.     Pharynx: Oropharynx is clear.  Eyes:     Extraocular  Movements: Extraocular movements intact.     Conjunctiva/sclera: Conjunctivae normal.  Cardiovascular:     Rate and Rhythm: Normal rate and regular rhythm.     Pulses: Normal pulses.     Heart sounds: Normal heart sounds.  Pulmonary:     Effort: Pulmonary effort is normal.     Breath sounds: Normal breath sounds. No wheezing or rales.  Abdominal:     General: Abdomen is flat. Bowel sounds are normal.     Palpations: Abdomen is soft.  Musculoskeletal:     Cervical back: Normal range of motion and neck supple.     Right lower leg: No edema.     Left lower leg: No edema.     Comments: Left upper arm AV fistula, pulsatile  Skin:    General: Skin is warm and dry.  Neurological:     Mental Status: He is alert and oriented to person, place, and time. Mental status is at baseline.  Psychiatric:        Mood and Affect: Mood normal.    Assessment/Plan: 1.  Hypertensive crisis: Blood pressures currently under decent control with weaning nitro drip and oral carvedilol/clonidine.  He is not hypervolemic on exam to prompt need for urgent/emergent hemodialysis. 2.  Syncopal event: Unclear etiology without any critical arrhythmia seen overnight on telemetry.  CT scan of the head without any acute intracranial  lesion. 3.  End-stage renal disease: We will order for hemodialysis again tomorrow to resume his outpatient dialysis schedule as he does not have any acute needs at this time. 4.  Anemia of chronic disease: Will resume ESA with hemodialysis again tomorrow. 5.  Secondary hyperparathyroidism: Continue renal diet with ongoing phosphorus binders and will restart Hectorol with hemodialysis for PTH control. 6.  History of HIV infection: Resume antiretroviral therapy per primary service  Stephine Langbehn K. 10/21/2021, 11:53 AM

## 2021-10-22 LAB — CBC
HCT: 31.7 % — ABNORMAL LOW (ref 39.0–52.0)
Hemoglobin: 10.1 g/dL — ABNORMAL LOW (ref 13.0–17.0)
MCH: 31.5 pg (ref 26.0–34.0)
MCHC: 31.9 g/dL (ref 30.0–36.0)
MCV: 98.8 fL (ref 80.0–100.0)
Platelets: 190 10*3/uL (ref 150–400)
RBC: 3.21 MIL/uL — ABNORMAL LOW (ref 4.22–5.81)
RDW: 16.9 % — ABNORMAL HIGH (ref 11.5–15.5)
WBC: 6.1 10*3/uL (ref 4.0–10.5)
nRBC: 0 % (ref 0.0–0.2)

## 2021-10-22 LAB — RENAL FUNCTION PANEL
Albumin: 2.5 g/dL — ABNORMAL LOW (ref 3.5–5.0)
Anion gap: 15 (ref 5–15)
BUN: 59 mg/dL — ABNORMAL HIGH (ref 8–23)
CO2: 23 mmol/L (ref 22–32)
Calcium: 8 mg/dL — ABNORMAL LOW (ref 8.9–10.3)
Chloride: 101 mmol/L (ref 98–111)
Creatinine, Ser: 12.63 mg/dL — ABNORMAL HIGH (ref 0.61–1.24)
GFR, Estimated: 4 mL/min — ABNORMAL LOW (ref >60)
Glucose, Bld: 83 mg/dL (ref 70–99)
Phosphorus: 7.4 mg/dL — ABNORMAL HIGH (ref 2.5–4.6)
Potassium: 5.1 mmol/L (ref 3.5–5.1)
Sodium: 139 mmol/L (ref 135–145)

## 2021-10-22 LAB — GLUCOSE, CAPILLARY
Glucose-Capillary: 85 mg/dL (ref 70–99)
Glucose-Capillary: 69 mg/dL — ABNORMAL LOW (ref 70–99)

## 2021-10-22 MED ORDER — HYDRALAZINE HCL 25 MG PO TABS: 25.0000 mg | ORAL_TABLET | Freq: Three times a day (TID) | ORAL | 3 refills | Status: DC

## 2021-10-22 MED ORDER — PENTAFLUOROPROP-TETRAFLUOROETH EX AERO
1.0000 "application " | INHALATION_SPRAY | CUTANEOUS | Status: DC | PRN
  Filled 2021-10-22: qty 103.5

## 2021-10-22 MED ORDER — LIDOCAINE-PRILOCAINE 2.5-2.5 % EX CREA
1.0000 "application " | TOPICAL_CREAM | CUTANEOUS | Status: DC | PRN
  Filled 2021-10-22: qty 5

## 2021-10-22 MED ORDER — NICOTINE 14 MG/24HR TD PT24: 14.0000 mg | MEDICATED_PATCH | Freq: Every day | TRANSDERMAL | 0 refills | Status: DC | PRN

## 2021-10-22 MED ORDER — HEPARIN SODIUM (PORCINE) 1000 UNIT/ML DIALYSIS
40.0000 [IU]/kg | INTRAMUSCULAR | Status: DC | PRN
  Filled 2021-10-22: qty 4

## 2021-10-22 MED ORDER — HEPARIN SODIUM (PORCINE) 1000 UNIT/ML DIALYSIS
1000.0000 [IU] | INTRAMUSCULAR | Status: DC | PRN
  Filled 2021-10-22: qty 1

## 2021-10-22 MED ORDER — SODIUM CHLORIDE 0.9 % IV SOLN: 100.0000 mL | INTRAVENOUS | Status: DC | PRN

## 2021-10-22 MED ORDER — HYDRALAZINE HCL 25 MG PO TABS
25.0000 mg | ORAL_TABLET | Freq: Three times a day (TID) | ORAL | Status: DC
  Administered 2021-10-22: 25 mg via ORAL
  Filled 2021-10-22: qty 1

## 2021-10-22 MED ORDER — LIDOCAINE HCL (PF) 1 % IJ SOLN: 5.0000 mL | INTRAMUSCULAR | Status: DC | PRN

## 2021-10-22 MED ORDER — POLYETHYLENE GLYCOL 3350 17 G PO PACK: 17.0000 g | PACK | Freq: Every day | ORAL | 0 refills | Status: DC | PRN

## 2021-10-22 MED ORDER — ALTEPLASE 2 MG IJ SOLR
2.0000 mg | Freq: Once | INTRAMUSCULAR | Status: DC | PRN
  Filled 2021-10-22: qty 2

## 2021-10-22 NOTE — Progress Notes (Signed)
Attempted to call report, placed on hold and then phone disconnected. Will try again soon.

## 2021-10-22 NOTE — TOC Progression Note (Signed)
Transition of Care [facility]) - Progression Note    Patient Details  Name: Richard [name] MRN: [id number] Date of Birth: March 21, 1956  [facility] of [facility]) CM/SW Contact  Zenon Mayo, RN Phone Number: 10/22/2021, 11:52 AM  Clinical Narrative:    Patient is from [place], per MD he can dc today back to [place].  NCM contacted Shirlee Limerick , she states she just needs dc summary. Patient will need to go by ptar.         Expected Discharge Plan and Services                                                 Social Determinants of Health (SDOH) Interventions    Readmission Risk Interventions Readmission Risk Prevention Plan 02/15/2020  Transportation Screening Complete  PCP or Specialist Appt within 5-7 Days Not Complete  Not Complete comments pending disposition  Home Care Screening Complete  Medication Review (RN CM) Referral to Pharmacy  Some recent data might be hidden

## 2021-10-22 NOTE — TOC Progression Note (Signed)
Transition of Care [facility]) - Progression Note    Patient Details  Name: Richard [name] MRN: [id number] Date of Birth: 05-Jan-1956  [facility] CM/SW Contact  Zenon Mayo, RN Phone Number: 10/22/2021, 10:44 AM  Clinical Narrative:    [facility], notification was called in the [place] number is [phone]. Per pt eval is rec SNF.  CSW aware. TOC will continue to follow for dc needs.        Expected Discharge Plan and Services                                                 Social Determinants of Health (SDOH) Interventions    Readmission Risk Interventions Readmission Risk Prevention Plan 02/15/2020  Transportation Screening Complete  PCP or Specialist Appt within 5-7 Days Not Complete  Not Complete comments pending disposition  Home Care Screening Complete  Medication Review (RN CM) Referral to Pharmacy  Some recent data might be hidden

## 2021-10-22 NOTE — TOC Transition Note (Signed)
Transition of Care [facility]) - CM/SW Discharge Note   Patient Details  Name: Richard [name] MRN: [id number] Date of Birth: 07/07/1956  [facility]) CM/SW Contact:  Richard Mayo, RN Phone Number: 10/22/2021, 12:35 PM   Clinical Narrative:    Patient is for dc today back to [facility],  Nurse to call report to [phone].  Patient will need ptar transport.  NCM asked if a family member need to be notified, he said just notify [place]. NCM scheduled transport for 3 pm.  NCM asked patient if could let his daughter know he is going back today, he said yes, NCM notified Richard [name] , his daughter that he will be going back to [place] today.   Final next level of care: Skilled Nursing Facility Barriers to Discharge: No Barriers Identified   Patient Goals and CMS Choice Patient states their goals for this hospitalization and ongoing recovery are:: return back to SNF   Choice offered to / list presented to : NA  Discharge Placement                       Discharge Plan and Services   Discharge Planning Services: CM Consult Post Acute Care Choice: NA            DME Agency: NA       HH Arranged: NA          Social Determinants of Health (SDOH) Interventions     Readmission Risk Interventions Readmission Risk Prevention Plan 10/22/2021 02/15/2020  Transportation Screening Complete Complete  PCP or Specialist Appt within 5-7 Days - Not Complete  Not Complete comments - pending disposition  Home Care Screening - Complete  Medication Review (RN CM) - Referral to Pharmacy  Medication Review (RN Care Manager) Complete -  PCP or Specialist appointment within 3-5 days of discharge Complete -  [place] or Home Care Consult Complete -  SW Recovery Care/Counseling Consult Complete -  Palliative Care Screening Not Applicable -  Skilled Nursing Facility Complete -  Some recent data might be hidden

## 2021-10-22 NOTE — Progress Notes (Signed)
Attempted to call report again, no answer.

## 2021-10-22 NOTE — Progress Notes (Signed)
D/C order noted. Contacted [place] and spoke to [place]. Clinic aware pt to return to snf today and will resume care on Thursday.   Melven Sartorius Renal Navigator [phone]

## 2021-10-22 NOTE — Procedures (Signed)
Patient seen on Hemodialysis. BP (!) 155/66    Pulse 75    Temp 98.1 F (36.7 C) (Oral)    Resp 14    Ht 6' (1.829 m)    Wt 77.7 kg    SpO2 98%    BMI 23.23 kg/m   QB 300, UF goal 2.5L Tolerating treatment without complaints at this time.   Elmarie Shiley MD [facility] Office # [phone] Pager # [phone] 9:45 AM

## 2021-10-22 NOTE — Discharge Summary (Signed)
Physician Discharge Summary  Richard [name] NOM:[id number] DOB: [birth year] DOA: 10/19/2021  PCP: [name], MD  Admit date: 10/19/2021 Discharge date: 10/22/2021  Admitted From: SNF.  Disposition:  SNF  Recommendations for Outpatient Follow-up:  Follow up with PCP in 1-2 weeks Please obtain BMP/CBC in one week Please follow up with nephrology and cardiology as needed.   Discharge Condition: stable.  CODE STATUS:full code.  Diet recommendation: Heart Healthy   Brief/Interim Summary: Richard [name] is a 66 y.o. 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  Bp parameters have improved.   Discharge Diagnoses:  Principal Problem:   Hypertensive crisis Active Problems:   Tobacco abuse   Hyperlipidemia   DM2 (diabetes mellitus, type 2) ([place])   History of cerebrovascular accident (CVA) with residual deficit   Seizure ([place])   ESRD on hemodialysis ([place])   Hypermagnesemia   Prolonged QT interval   Allergic rhinitis  Hypertensive Crisis:  Resolved. Titrated off NITRO gtt.  Echocardiogram showed worsening LVEF of 45 to 50%. The left ventricle demonstrates regional wall motion abnormalities. Moderate left ventricular hypertrophy. Left ventricular diastolic parameters are consistent with grade 1 diastolic dysfunction.  Cardiology consulted for wall motion abnormalities, to see if he needs ischemic work up. NO FURTHER WORK NEEDED.  Meanwhile continue with coreg 12.5 mg BID, clonidine 0.1 mg TID.    EKG shows NSR with LVH, prolonged Qtc.  EKG shows improvement in  Qtc Pt denies any chest pain.      Syncope:  - unclear etiology.  Monitor for arrhythmias. Differentials include Seizures, vs ischemic event.  CT head showed Old lacunar infarcts are seen in the left basal ganglia and periventricular region. No significant interval changes are noted since 08/09/2020. - EEG is negative for active seizures.  - no arrhythmias.  - carotid duplex does not show significant stenosis.      ESRD on HD:  Nephrology on board. Plan for HD in amm      H/o CVA:  With residual deficits.  Continue with plavix and statin.        Type 2 DM: with hyperglycemia:   Resume SSI.  A1c is 5.2%       Seizures:  Continue with keppra .      BPH Continue with flomax.      GERD;  Stable on protonix.        HIV: Continue with Biktarvy and on Bactrim.     Discharge Instructions  Discharge Instructions     Diet - low sodium heart healthy   Complete by: As directed    Increase activity slowly   Complete by: As directed       Allergies as of 10/22/2021   No Known Allergies      Medication List     STOP taking these medications    sildenafil 20 MG tablet Commonly known as: REVATIO       TAKE these medications    Accu-Chek Aviva Plus test strip Generic drug: glucose blood USE 1 strip TO test blood sugar 2 TIMES DAILY TO 3 TIMES DAILY   acetaminophen 325 MG tablet Commonly known as: TYLENOL Take 650 mg by mouth every  6 (six) hours as needed for moderate pain, fever or mild pain.   bictegravir-emtricitabine-tenofovir AF 50-200-25 MG Tabs tablet Commonly known as: BIKTARVY Take 1 tablet by mouth daily.   blood glucose meter kit and supplies Kit Dispense based on patient and insurance preference. Check blood sugar 4 times a day   calcium acetate (Phos Binder) 667 MG/5ML Soln Commonly known as: PHOSLYRA Take 1,334 mg by mouth 3 (three) times daily with meals.   carvedilol 12.5 MG tablet Commonly known as: COREG Take 12.5 mg by  mouth in the morning and at bedtime.   cetirizine 10 MG tablet Commonly known as: ZYRTEC Take 10 mg by mouth daily.   cloNIDine 0.1 MG tablet Commonly known as: CATAPRES Take 1 tablet (0.1 mg total) by mouth 3 (three) times daily.   clopidogrel 75 MG tablet Commonly known as: PLAVIX Take 1 tablet (75 mg total) by mouth daily.   Commode 3-In-1 Misc Use as directed   Transfer Bench Misc Use as directed   Darbepoetin Alfa 60 MCG/0.3ML Sosy injection Commonly known as: ARANESP Inject 0.3 mLs (60 mcg total) into the vein every Saturday with hemodialysis.   docusate sodium 100 MG capsule Commonly known as: COLACE Take 100 mg by mouth daily.   doxepin 75 MG capsule Commonly known as: SINEQUAN Take 75 mg by mouth at bedtime.   Ergocalciferol 50 MCG (2000 UT) Tabs Take 2,000 Units by mouth daily.   hydrALAZINE 25 MG tablet Commonly known as: APRESOLINE Take 1 tablet (25 mg total) by mouth every 8 (eight) hours.   insulin glargine 100 unit/mL Sopn Commonly known as: LANTUS Inject 5 Units into the skin at bedtime.   Insulin Pen Needle 31G X 8 MM Misc Commonly known as: Sure Comfort Pen Needles Use as directed twice daily with Novolog flex pen   levETIRAcetam 500 MG tablet Commonly known as: KEPPRA Take 500 mg by mouth in the morning and at bedtime.   Lidocaine 3 % Crea Apply 1 application topically See admin instructions. Apply 1 times a day on Tuesday, Thursday and Saturday prior to dialysis   multivitamin with minerals Tabs tablet Take 1 tablet by mouth at bedtime.   nicotine 14 mg/24hr patch Commonly known as: NICODERM CQ - dosed in mg/24 hours Place 1 patch (14 mg total) onto the skin daily as needed (smoking cessation).   pantoprazole 40 MG tablet Commonly known as: PROTONIX TAKE 1 TABLET BY MOUTH EVERY DAY What changed: how to take this   polyethylene glycol 17 g packet Commonly known as: MIRALAX / GLYCOLAX Take 17 g by mouth daily as needed for severe  constipation.   pravastatin 80 MG tablet Commonly known as: PRAVACHOL Take 1 tablet (80 mg total) by mouth at bedtime.   Retacrit 20000 UNIT/ML injection Generic drug: epoetin alfa-epbx Inject 20,000 Units into the skin every Monday.   sildenafil 100 MG tablet Commonly known as: VIAGRA Take 100 mg by mouth daily as needed for erectile dysfunction.   sulfamethoxazole-trimethoprim 800-160 MG tablet Commonly known as: BACTRIM DS Take 1 tablet by mouth 3 (three) times a week. What changed:  when to take this additional instructions   tamsulosin 0.4 MG Caps capsule Commonly known as: FLOMAX TAKE 1 CAPSULE BY MOUTH EVERY DAY   traMADol 50 MG tablet Commonly known as: ULTRAM Take 50 mg by mouth every 8 (eight) hours as needed for moderate pain.   triamcinolone cream 0.1 % Commonly known as: KENALOG Apply 1 application topically 2 (two) times daily.  To arms and legs   TRUEplus Insulin Syringe 31G X 5/16" 1 ML Misc Generic drug: Insulin Syringe-Needle U-100 Use pen needle with insulin 2 times daily   Vitamin D 50 MCG (2000 UT) tablet Take 1 tablet (2,000 Units total) by mouth in the morning.        Follow-up Information     [name], MD Follow up.   Specialty: Family Medicine Contact information: [place]  [phone]                No Known Allergies  Consultations: Cardiology Nephrology.    Procedures/Studies: CT HEAD WO CONTRAST  Result Date: 10/19/2021 CLINICAL DATA:  Trauma, patient on anticoagulation regimen. EXAM: CT HEAD WITHOUT CONTRAST TECHNIQUE: Contiguous axial images were obtained from the base of the skull through the vertex without intravenous contrast. RADIATION DOSE REDUCTION: This exam was performed according to the departmental dose-optimization program which includes automated exposure control, adjustment of the mA and/or kV according to patient size and/or use of iterative reconstruction  technique. COMPARISON:  08/09/2020 FINDINGS: Brain: No acute intracranial findings are seen. Cortical sulci are prominent. There is decreased density in the periventricular and subcortical white matter. There are small old lacunar infarcts in the left basal ganglia and left periventricular region. Vascular: There are scattered arterial calcifications. Skull: No fracture is seen. Sinuses/Orbits: There is mucosal thickening in the frontal and ethmoid sinuses. Other: None IMPRESSION: No acute intracranial findings are seen in noncontrast CT brain. Atrophy. Small-vessel disease. Old lacunar infarcts are seen in the left basal ganglia and periventricular region. No significant interval changes are noted since 08/09/2020. There is fluid density in right side of frontal sinus and right ethmoid sinus, possibly sinusitis. Electronically Signed   By: Elmer Picker M.D.   On: 10/19/2021 13:36   DG Pelvis Portable  Result Date: 10/19/2021 CLINICAL DATA:  Fall. EXAM: PORTABLE PELVIS 1-2 VIEWS COMPARISON:  December 21, 2020. FINDINGS: There is no evidence of pelvic fracture or diastasis. No pelvic bone lesions are seen. IMPRESSION: Negative. Electronically Signed   By: Marijo Conception M.D.   On: 10/19/2021 13:12   DG Chest Port 1 View  Result Date: 10/19/2021 CLINICAL DATA:  Fall. EXAM: PORTABLE CHEST 1 VIEW COMPARISON:  June 25, 2021. FINDINGS: Stable cardiomediastinal silhouette. Both lungs are clear. The visualized skeletal structures are unremarkable. IMPRESSION: No active disease. Electronically Signed   By: Marijo Conception M.D.   On: 10/19/2021 13:11   EEG adult  Result Date: 10/19/2021 Lora Havens, MD     10/19/2021  5:01 PM Patient Name: Richard [name] MRN: [id number] Epilepsy Attending: Lora Havens Referring Physician/Provider: Karmen Bongo, MD Date: 10/19/2021 Duration: 22.26 mins Patient history: 66yo m with h/o seizures who presented after fall. EEG to evaluate for seizure. Level of  alertness: Awake, drowsy AEDs during EEG study: LEV Technical aspects: This EEG study was done with scalp electrodes positioned according to the 10-20 International system of electrode placement. Electrical activity was acquired at a sampling rate of _0  and reviewed with a high frequency filter of _1  and a low frequency filter of _2 . EEG data were recorded continuously and digitally stored. Description: The posterior dominant rhythm consists of 7.5 Hz activity of moderate voltage (25-35 uV) seen predominantly in posterior head regions, symmetric and reactive to eye opening and eye closing. Drowsiness was characterized by attenuation of the posterior background. rhythm.Hyperventilation and photic stimulation were not performed.   IMPRESSION: This study  is within normal limits. No seizures or epileptiform discharges were seen throughout the recording. Lora Havens   ECHOCARDIOGRAM COMPLETE  Result Date: 10/20/2021    ECHOCARDIOGRAM REPORT   Patient Name:   Richard [name] Date of Exam: 10/20/2021 Medical Rec #:  [id number]             Height:       72.0 in Accession #:    [id number]            Weight:       169.8 lb Date of Birth:  May 11, 1956             BSA:          1.987 m Patient Age:    68 years              BP:           170/82 mmHg Patient Gender: M                     HR:           87 bpm. Exam Location:  Inpatient Procedure: 2D Echo, Cardiac Doppler and Color Doppler Indications:    R55 Syncope  History:        Patient has prior history of Echocardiogram examinations, most                 recent 02/17/2021. Stroke; Risk Factors:Hypertension and                 Diabetes.  Sonographer:    Bernadene Person RDCS Referring Phys: [id number] [place]  1. Left ventricular ejection fraction, by estimation, is 45 to 50%. The left ventricle has mildly decreased function. The left ventricle demonstrates regional wall motion abnormalities (see scoring diagram/findings for description).  There is moderate left ventricular hypertrophy. Left ventricular diastolic parameters are consistent with Grade I diastolic dysfunction (impaired relaxation).  2. Right ventricular systolic function is normal. The right ventricular size is normal.  3. The mitral valve is normal in structure. No evidence of mitral valve regurgitation. No evidence of mitral stenosis.  4. The aortic valve is tricuspid. There is mild calcification of the aortic valve. Aortic valve regurgitation is not visualized. Aortic valve sclerosis is present, with no evidence of aortic valve stenosis.  5. The inferior vena cava is normal in size with greater than 50% respiratory variability, suggesting right atrial pressure of 3 mmHg. Comparison(s): Prior images reviewed side by side. FINDINGS  Left Ventricle: Left ventricular ejection fraction, by estimation, is 45 to 50%. The left ventricle has mildly decreased function. The left ventricle demonstrates regional wall motion abnormalities. The left ventricular internal cavity size was normal in size. There is moderate left ventricular hypertrophy. Left ventricular diastolic parameters are consistent with Grade I diastolic dysfunction (impaired relaxation).  LV Wall Scoring: The inferior wall is akinetic. Right Ventricle: The right ventricular size is normal. No increase in right ventricular wall thickness. Right ventricular systolic function is normal. Left Atrium: Left atrial size was normal in size. Right Atrium: Right atrial size was normal in size. Pericardium: There is no evidence of pericardial effusion. Mitral Valve: The mitral valve is normal in structure. No evidence of mitral valve regurgitation. No evidence of mitral valve stenosis. Tricuspid Valve: The tricuspid valve is normal in structure. Tricuspid valve regurgitation is not demonstrated. No evidence of tricuspid stenosis. Aortic Valve: The aortic valve is tricuspid. There is mild calcification of the aortic  valve. Aortic valve  regurgitation is not visualized. Aortic valve sclerosis is present, with no evidence of aortic valve stenosis. Pulmonic Valve: The pulmonic valve was normal in structure. Pulmonic valve regurgitation is not visualized. No evidence of pulmonic stenosis. Aorta: The aortic root is normal in size and structure. Venous: The inferior vena cava is normal in size with greater than 50% respiratory variability, suggesting right atrial pressure of 3 mmHg. IAS/Shunts: No atrial level shunt detected by color flow Doppler.  LEFT VENTRICLE PLAX 2D LVIDd:         5.00 cm LVIDs:         3.70 cm LV PW:         1.40 cm LV IVS:        1.30 cm LVOT diam:     2.10 cm LV SV:         61 LV SV Index:   31 LVOT Area:     3.46 cm  LV Volumes (MOD) LV vol d, MOD A2C: 110.0 ml LV vol d, MOD A4C: 111.0 ml LV vol s, MOD A2C: 52.1 ml LV vol s, MOD A4C: 56.9 ml LV SV MOD A2C:     57.9 ml LV SV MOD A4C:     111.0 ml LV SV MOD BP:      59.4 ml RIGHT VENTRICLE RV S prime:     9.67 cm/s TAPSE (M-mode): 2.1 cm LEFT ATRIUM           Index        RIGHT ATRIUM           Index LA diam:      3.70 cm 1.86 cm/m   RA Area:     14.10 cm LA Vol (A2C): 61.3 ml 30.85 ml/m  RA Volume:   29.10 ml  14.64 ml/m LA Vol (A4C): 44.1 ml 22.19 ml/m  AORTIC VALVE LVOT Vmax:   89.70 cm/s LVOT Vmean:  61.600 cm/s LVOT VTI:    0.175 m  AORTA Ao Root diam: 3.50 cm Ao Asc diam:  3.40 cm MITRAL VALVE MV Area (PHT): 6.43 cm     SHUNTS MV Decel Time: 118 msec     Systemic VTI:  0.18 m MV E velocity: 81.20 cm/s   Systemic Diam: 2.10 cm MV A velocity: 151.00 cm/s MV E/A ratio:  0.54 Candee Furbish MD Electronically signed by Candee Furbish MD Signature Date/Time: 10/20/2021/11:20:39 AM    Final    VAS US CAROTID  Result Date: 10/20/2021 Carotid Arterial Duplex Study Patient Name:  Richard [name]  Date of Exam:   10/20/2021 Medical Rec #: [id number]              Accession #:    [id number] Date of Birth: 1956-06-08              Patient Gender: M Patient Age:   39 years Exam  Location:  [facility] Procedure:      VAS US CAROTID Referring Phys: Babs Bertin --------------------------------------------------------------------------------  Indications:       Syncope. Risk Factors:      Hypertension, hyperlipidemia, Diabetes. Limitations        Today's exam was limited due to the high bifurcation of the                    carotid, the body habitus of the patient and the patient's  respiratory variation. Comparison Study:  No prior studies. Performing Technologist: Oliver Hum RVT  Examination Guidelines: A complete evaluation includes B-mode imaging, spectral Doppler, color Doppler, and power Doppler as needed of all accessible portions of each vessel. Bilateral testing is considered an integral part of a complete examination. Limited examinations for reoccurring indications may be performed as noted.  Right Carotid Findings: +----------+--------+--------+--------+-----------------------+--------+             PSV cm/s EDV cm/s Stenosis Plaque Description      Comments  +----------+--------+--------+--------+-----------------------+--------+  CCA Prox   77       11                smooth and heterogenous           +----------+--------+--------+--------+-----------------------+--------+  CCA Distal 57       11                smooth and heterogenous           +----------+--------+--------+--------+-----------------------+--------+  ICA Prox   78       11                smooth and heterogenous           +----------+--------+--------+--------+-----------------------+--------+  ICA Distal 44       11                                        tortuous  +----------+--------+--------+--------+-----------------------+--------+  ECA        29       3                                                   +----------+--------+--------+--------+-----------------------+--------+ +----------+--------+-------+--------+-------------------+             PSV cm/s EDV cms Describe Arm  Pressure (mmHG)  +----------+--------+-------+--------+-------------------+  Subclavian 100                                            +----------+--------+-------+--------+-------------------+ +---------+--------+--+--------+-+----------------------------+  Vertebral PSV cm/s 31 EDV cm/s 5 Antegrade and High resistant  +---------+--------+--+--------+-+----------------------------+  Left Carotid Findings: +----------+--------+--------+--------+-----------------------+--------+             PSV cm/s EDV cm/s Stenosis Plaque Description      Comments  +----------+--------+--------+--------+-----------------------+--------+  CCA Prox   70       11                smooth and heterogenous           +----------+--------+--------+--------+-----------------------+--------+  CCA Distal 67       11                smooth and heterogenous           +----------+--------+--------+--------+-----------------------+--------+  ICA Prox   83       12                smooth and heterogenous           +----------+--------+--------+--------+-----------------------+--------+  ICA Distal 43       9  tortuous  +----------+--------+--------+--------+-----------------------+--------+  ECA        62       5                                                   +----------+--------+--------+--------+-----------------------+--------+ +----------+--------+--------+--------+-------------------+             PSV cm/s EDV cm/s Describe Arm Pressure (mmHG)  +----------+--------+--------+--------+-------------------+  Subclavian 161                                             +----------+--------+--------+--------+-------------------+ +---------+--------+--+--------+-+----------------------------+  Vertebral PSV cm/s 23 EDV cm/s 5 Antegrade and High resistant  +---------+--------+--+--------+-+----------------------------+   Summary: Right Carotid: Velocities in the right ICA are consistent with a 1-39% stenosis. Left  Carotid: Velocities in the left ICA are consistent with a 1-39% stenosis. Vertebrals: Bilateral vertebral arteries demonstrate high resistant flow. *See table(s) above for measurements and observations.  Electronically signed by Servando Snare MD on 10/20/2021 at 3:51:47 PM.    Final       Subjective: No new complaints.   Discharge Exam: Vitals:   10/22/21 1034 10/22/21 1103  BP: (!) 145/68 (!) 154/86  Pulse:  97  Resp: 15 17  Temp:  98.6 F (37 C)  SpO2:  98%   Vitals:   10/22/21 1000 10/22/21 1029 10/22/21 1034 10/22/21 1103  BP: (!) 169/68 (!) 166/69 (!) 145/68 (!) 154/86  Pulse:    97  Resp: _0 Temp:  98 F (36.7 C)  98.6 F (37 C)  TempSrc:  Oral  Oral  SpO2:  98%  98%  Weight:  75.2 kg    Height:        General: Pt is alert, awake, not in acute distress Cardiovascular: RRR, S1/S2 +, no rubs, no gallops Respiratory: CTA bilaterally, no wheezing, no rhonchi Abdominal: Soft, NT, ND, bowel sounds + Extremities: no edema, no cyanosis    The results of significant diagnostics from this hospitalization (including imaging, microbiology, ancillary and laboratory) are listed below for reference.     Microbiology: Recent Results (from the past 240 hour(s))  Resp Panel by RT-PCR (Flu A&B, Covid) Nasopharyngeal Swab     Status: None   Collection Time: 10/19/21  2:29 PM   Specimen: Nasopharyngeal Swab; Nasopharyngeal(NP) swabs in vial transport medium  Result Value Ref Range Status   SARS Coronavirus 2 by RT PCR NEGATIVE NEGATIVE Final    Comment: (NOTE) SARS-CoV-2 target nucleic acids are NOT DETECTED.  The SARS-CoV-2 RNA is generally detectable in upper respiratory specimens during the acute phase of infection. The lowest concentration of SARS-CoV-2 viral copies this assay can detect is 138 copies/mL. A negative result does not preclude SARS-Cov-2 infection and should not be used as the sole basis for treatment or other patient management decisions. A  negative result may occur with  improper specimen collection/handling, submission of specimen other than nasopharyngeal swab, presence of viral mutation(s) within the areas targeted by this assay, and inadequate number of viral copies(<138 copies/mL). A negative result must be combined with clinical observations, patient history, and epidemiological information. The expected result is Negative.  Fact Sheet for Patients:  EntrepreneurPulse.com.au  Fact Sheet for Healthcare Providers:  IncredibleEmployment.be  This  test is no t yet approved or cleared by the [place] and  has been authorized for detection and/or diagnosis of SARS-CoV-2 by FDA under an Emergency Use Authorization (EUA). This EUA will remain  in effect (meaning this test can be used) for the duration of the COVID-19 declaration under Section 564(b)(1) of the Act, 21 U.S.C.section 360bbb-3(b)(1), unless the authorization is terminated  or revoked sooner.       Influenza A by PCR NEGATIVE NEGATIVE Final   Influenza B by PCR NEGATIVE NEGATIVE Final    Comment: (NOTE) The Xpert Xpress SARS-CoV-2/FLU/RSV plus assay is intended as an aid in the diagnosis of influenza from Nasopharyngeal swab specimens and should not be used as a sole basis for treatment. Nasal washings and aspirates are unacceptable for Xpert Xpress SARS-CoV-2/FLU/RSV testing.  Fact Sheet for Patients: EntrepreneurPulse.com.au  Fact Sheet for Healthcare Providers: IncredibleEmployment.be  This test is not yet approved or cleared by the [place] FDA and has been authorized for detection and/or diagnosis of SARS-CoV-2 by FDA under an Emergency Use Authorization (EUA). This EUA will remain in effect (meaning this test can be used) for the duration of the COVID-19 declaration under Section 564(b)(1) of the Act, 21 U.S.C. section 360bbb-3(b)(1), unless the authorization  is terminated or revoked.  Performed at [facility], [place] [address], [place], [place] [address]      Labs: BNP (last 3 results) Recent Labs    02/17/21 0523 04/14/21 0400  BNP 983.1* 8,412.8*   Basic Metabolic Panel: Recent Labs  Lab 10/19/21 1300 10/20/21 0915 10/21/21 0544 10/22/21 0704  NA 137 142 140 139  K 4.7 4.5 4.1 5.1  CL 98 100 99 101  CO2 _0 GLUCOSE 164* 114* 131* 83  BUN 37* 43* 48* 59*  CREATININE 9.60* 11.09* 11.78* 12.63*  CALCIUM 8.3* 8.5* 8.2* 8.0*  MG 3.9* 4.1*  --   --   PHOS  --  5.8*  --  7.4*   Liver Function Tests: Recent Labs  Lab 10/19/21 1300 10/20/21 0915 10/22/21 0704  AST 18 15  --   ALT 9 11  --   ALKPHOS 110 94  --   BILITOT 0.4 0.5  --   PROT 6.5 7.3  --   ALBUMIN 3.1* 3.3* 2.5*   No results for input(s): LIPASE, AMYLASE in the last 168 hours. No results for input(s): AMMONIA in the last 168 hours. CBC: Recent Labs  Lab 10/19/21 1300 10/20/21 0915 10/21/21 0544 10/22/21 0705  WBC 6.9 6.6 7.0 6.1  NEUTROABS 4.1 3.8  --   --   HGB 11.6* 11.6* 10.1* 10.1*  HCT 37.3* 37.5* 31.2* 31.7*  MCV 101.9* 98.7 98.4 98.8  PLT 176 236 198 190   Cardiac Enzymes: No results for input(s): CKTOTAL, CKMB, CKMBINDEX, TROPONINI in the last 168 hours. BNP: Invalid input(s): POCBNP CBG: Recent Labs  Lab 10/21/21 1129 10/21/21 1607 10/21/21 2129 10/22/21 0611 10/22/21 1144  GLUCAP 101* 139* 219* 85 69*   D-Dimer No results for input(s): DDIMER in the last 72 hours. Hgb A1c Recent Labs    10/21/21 0544  HGBA1C 5.2   Lipid Profile Recent Labs    10/21/21 0544  CHOL 103  HDL 41  LDLCALC 46  TRIG 80  CHOLHDL 2.5   Thyroid function studies Recent Labs    10/20/21 0915  TSH 1.575   Anemia work up No results for input(s): VITAMINB12, FOLATE, FERRITIN, TIBC, IRON, RETICCTPCT in the  last 72 hours. Urinalysis    Component Value Date/Time   COLORURINE STRAW (A) 04/11/2020 0223   APPEARANCEUR CLEAR  04/11/2020 0223   LABSPEC 1.011 04/11/2020 0223   PHURINE 5.0 04/11/2020 0223   GLUCOSEU 50 (A) 04/11/2020 0223   HGBUR SMALL (A) 04/11/2020 0223   BILIRUBINUR NEGATIVE 04/11/2020 0223   KETONESUR NEGATIVE 04/11/2020 0223   PROTEINUR >=300 (A) 04/11/2020 0223   UROBILINOGEN 0.2 11/29/2014 2003   NITRITE NEGATIVE 04/11/2020 0223   LEUKOCYTESUR NEGATIVE 04/11/2020 0223   Sepsis Labs Invalid input(s): PROCALCITONIN,  WBC,  LACTICIDVEN Microbiology Recent Results (from the past 240 hour(s))  Resp Panel by RT-PCR (Flu A&B, Covid) Nasopharyngeal Swab     Status: None   Collection Time: 10/19/21  2:29 PM   Specimen: Nasopharyngeal Swab; Nasopharyngeal(NP) swabs in vial transport medium  Result Value Ref Range Status   SARS Coronavirus 2 by RT PCR NEGATIVE NEGATIVE Final    Comment: (NOTE) SARS-CoV-2 target nucleic acids are NOT DETECTED.  The SARS-CoV-2 RNA is generally detectable in upper respiratory specimens during the acute phase of infection. The lowest concentration of SARS-CoV-2 viral copies this assay can detect is 138 copies/mL. A negative result does not preclude SARS-Cov-2 infection and should not be used as the sole basis for treatment or other patient management decisions. A negative result may occur with  improper specimen collection/handling, submission of specimen other than nasopharyngeal swab, presence of viral mutation(s) within the areas targeted by this assay, and inadequate number of viral copies(<138 copies/mL). A negative result must be combined with clinical observations, patient history, and epidemiological information. The expected result is Negative.  Fact Sheet for Patients:  EntrepreneurPulse.com.au  Fact Sheet for Healthcare Providers:  IncredibleEmployment.be  This test is no t yet approved or cleared by the [place] FDA and  has been authorized for detection and/or diagnosis of SARS-CoV-2 by FDA under an  Emergency Use Authorization (EUA). This EUA will remain  in effect (meaning this test can be used) for the duration of the COVID-19 declaration under Section 564(b)(1) of the Act, 21 U.S.C.section 360bbb-3(b)(1), unless the authorization is terminated  or revoked sooner.       Influenza A by PCR NEGATIVE NEGATIVE Final   Influenza B by PCR NEGATIVE NEGATIVE Final    Comment: (NOTE) The Xpert Xpress SARS-CoV-2/FLU/RSV plus assay is intended as an aid in the diagnosis of influenza from Nasopharyngeal swab specimens and should not be used as a sole basis for treatment. Nasal washings and aspirates are unacceptable for Xpert Xpress SARS-CoV-2/FLU/RSV testing.  Fact Sheet for Patients: EntrepreneurPulse.com.au  Fact Sheet for Healthcare Providers: IncredibleEmployment.be  This test is not yet approved or cleared by the [place] FDA and has been authorized for detection and/or diagnosis of SARS-CoV-2 by FDA under an Emergency Use Authorization (EUA). This EUA will remain in effect (meaning this test can be used) for the duration of the COVID-19 declaration under Section 564(b)(1) of the Act, 21 U.S.C. section 360bbb-3(b)(1), unless the authorization is terminated or revoked.  Performed at [facility], [place] [address] [place], [place] [address]      Time coordinating discharge: 39 minutes.   SIGNED:   Hosie Poisson, MD  [facility] 10/22/2021, 12:34 PM

## 2021-10-22 NOTE — Progress Notes (Signed)
Patient ID: Richard [name], male   DOB: 02-17-1956, 66 y.o.   MRN: [id number] [facility] Progress Note   Assessment/ Plan:   1.  Hypertensive crisis: Blood pressures currently improved with transition to PO anti-hypertensive medications and likely to improve further with hemodialysis and ultrafiltration. 2.  Syncopal event: Unclear etiology without any critical arrhythmia seen overnight on telemetry.  CT scan of the head without any acute intracranial lesion. 3.  End-stage renal disease: Continue HD on TTS schedule at this time with daily assessment for acute dialysis needs. 4.  Anemia of chronic disease: Will resume ESA with hemodialysis today.  5.  Secondary hyperparathyroidism: Continue renal diet with ongoing phosphorus binders and will restart Hectorol with hemodialysis for PTH control. 6.  History of HIV infection: Resume antiretroviral therapy per primary service  Subjective:   Reports to be feeling better and without acute complaints at this time.    Objective:   BP (!) 154/72    Pulse 75    Temp 98.1 F (36.7 C) (Oral)    Resp 16    Ht 6' (1.829 m)    Wt 77.7 kg    SpO2 98%    BMI 23.23 kg/m   Physical Exam: OMV:[id number] resting on dialysis EZM:OQHUT regular rhythm and normal rate, normal s1/s2 Resp:Clear to auscultation bilaterally, no rales/rhonchi Abd: Soft, flat, non-tender, bowel sounds normal Ext:No lower extremity edema, LUA AVF cannulated at dialysis  Labs: BMET Recent Labs  Lab 10/19/21 1300 10/20/21 0915 10/21/21 0544 10/22/21 0704  NA 137 142 140 139  K 4.7 4.5 4.1 5.1  CL 98 100 99 101  CO2 26 28 26 23   GLUCOSE 164* 114* 131* 83  BUN 37* 43* 48* 59*  CREATININE 9.60* 11.09* 11.78* 12.63*  CALCIUM 8.3* 8.5* 8.2* 8.0*  PHOS  --  5.8*  --  7.4*   CBC Recent Labs  Lab 10/19/21 1300 10/20/21 0915 10/21/21 0544 10/22/21 0705  WBC 6.9 6.6 7.0 6.1  NEUTROABS 4.1 3.8  --   --   HGB 11.6* 11.6* 10.1* 10.1*  HCT 37.3* 37.5*  31.2* 31.7*  MCV 101.9* 98.7 98.4 98.8  PLT 176 236 198 190      Medications:     bictegravir-emtricitabine-tenofovir AF  1 tablet Oral Daily   calcium acetate (Phos Binder)  1,334 mg Oral TID WC   carvedilol  12.5 mg Oral BID WC   Chlorhexidine Gluconate Cloth  6 each Topical Q0600   cloNIDine  0.1 mg Oral TID   clopidogrel  75 mg Oral Daily   darbepoetin (ARANESP) injection - DIALYSIS  60 mcg Intravenous Q Tue-HD   docusate sodium  100 mg Oral Daily   doxepin  75 mg Oral QHS   doxercalciferol  8 mcg Intravenous Q T,Th,Sa-HD   heparin injection (subcutaneous)  5,000 Units Subcutaneous Q8H   insulin aspart  0-6 Units Subcutaneous TID WC   levETIRAcetam  500 mg Oral BID   pantoprazole  40 mg Oral Daily   pravastatin  80 mg Oral QHS   sulfamethoxazole-trimethoprim  1 tablet Oral Q M,W,F   tamsulosin  0.4 mg Oral Daily   Elmarie Shiley, MD 10/22/2021, 9:38 AM

## 2021-10-22 NOTE — TOC Initial Note (Addendum)
Transition of Care [facility]) - Initial/Assessment Note    Patient Details  Name: Richard [name] MRN: [id number] Date of Birth: [birth year]  Transition of [facility]) CM/SW Contact:    Zenon Mayo, RN Phone Number: 10/22/2021, 11:59 AM  Clinical Narrative:                 Patient is for dc today back to [facility],  Nurse to call report to [phone].  Patient will need ptar transport.  NCM asked if a family member need to be notified, he said just notify [place]. NCM scheduled transport for 3 pm.  NCM asked patient if could let his daughter know he is going back today, he said yes, NCM notified [name] , his daughter that he will be going back to [place] today.  Expected Discharge Plan: Skilled Nursing Facility Barriers to Discharge: No Barriers Identified   Patient Goals and CMS Choice Patient states their goals for this hospitalization and ongoing recovery are:: return back to SNF   Choice offered to / list presented to : NA  Expected Discharge Plan and Services Expected Discharge Plan: [place]   Discharge Planning Services: CM Consult Post Acute Care Choice: NA Living arrangements for the past 2 months: [place]                   DME Agency: NA       HH Arranged: NA          Prior Living Arrangements/Services Living arrangements for the past 2 months: [place]   Patient language and need for interpreter reviewed:: Yes Do you feel safe going back to the place where you live?: Yes      Need for Family Participation in Patient Care: No (Comment) Care giver support system in place?: No (comment)   Criminal Activity/Legal Involvement Pertinent to Current Situation/Hospitalization: No - Comment as needed  Activities of Daily Living Home Assistive Devices/Equipment: Wheelchair (pt has a CBG machine to check sugar) ADL Screening (condition at time of admission) Patient's  cognitive ability adequate to safely complete daily activities?: Yes Is the patient deaf or have difficulty hearing?: No Does the patient have difficulty seeing, even when wearing glasses/contacts?: No Does the patient have difficulty concentrating, remembering, or making decisions?: No Patient able to express need for assistance with ADLs?: Yes Does the patient have difficulty dressing or bathing?: Yes Independently performs ADLs?: No Communication: Independent Dressing (OT): Needs assistance, Independent Grooming: Independent Feeding: Independent Bathing: Independent Toileting: Independent In/Out Bed: Independent Walks in Home: Needs assistance (1-2 assist with W/C) Is this a change from baseline?: Pre-admission baseline Does the patient have difficulty walking or climbing stairs?: Yes Weakness of Legs: Right Weakness of Arms/Hands: Right  Permission Sought/Granted                  Emotional Assessment Appearance:: Appears stated age Attitude/Demeanor/Rapport: Engaged Affect (typically observed): Appropriate Orientation: : Oriented to  Time, Oriented to Place, Oriented to Self Alcohol / Substance Use: Not Applicable Psych Involvement: No (comment)  Admission diagnosis:  Hypertensive crisis [I16.9] Trauma [T14.90XA] Uncontrolled hypertension [I10] Traumatic injury of head, initial encounter [S09.90XA] Syncope, unspecified syncope type [R55] Patient Active Problem List   Diagnosis Date Noted   Fall from non-moving wheelchair 10/19/2021   Hypertensive crisis 10/19/2021   Hypermagnesemia 10/19/2021   Prolonged QT interval 10/19/2021   Allergic rhinitis 10/19/2021   ESRD on hemodialysis ([place]) 08/30/2021   Buttock  pain 08/30/2021   Acute on chronic respiratory failure with hypoxia (HCC) 04/14/2021   Volume overload 04/14/2021   Elevated troponin 02/18/2021   Acute on chronic diastolic CHF (congestive heart failure) ([place]) 02/18/2021   Depression 02/18/2021    Hemorrhoids 01/11/2021   Hyperkalemia 01/10/2021   Dialysis patient, noncompliant ([place])    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   Syncope 06/01/2020   Secondary hyperparathyroidism of renal origin ([place]) 05/28/2020   Hypokalemia 05/16/2020   Allergy, unspecified, initial encounter 05/08/2020   Anaphylactic shock, unspecified, initial encounter 05/08/2020   Anemia in chronic kidney disease 62/83/6629   Complication of vascular dialysis catheter 05/08/2020   Dependence on renal dialysis ([place]) 05/08/2020   Headache, unspecified 05/08/2020   Iron deficiency anemia, unspecified 05/08/2020   Cerebellar stroke syndrome 05/07/2020   Encounter for feeding tube placement    Weakness    Creatinine elevation    Goals of care, counseling/discussion    Hypertensive urgency    Palliative care by specialist    AIDS (acquired immune deficiency syndrome) ([place]) 04/13/2020   Seizure ([place]) 04/10/2020   History of cerebrovascular accident (CVA) with residual deficit 03/19/2020   Neuropathy in diabetes ([place])    Migraines    Hypertension    GERD (gastroesophageal reflux disease)    DM2 (diabetes mellitus, type 2) ([place])    Arthritis    Hyperlipidemia 08/25/2012   Chronic kidney disease, stage 4 (severe) ([place]) 08/23/2012   Tobacco abuse 08/23/2012   Malnutrition of moderate degree ([place]) 08/23/2012   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 [place] [address] Phone: [phone] Fax: [phone]     Social Determinants of Health ([place]) Interventions    Readmission Risk Interventions Readmission Risk Prevention Plan 10/22/2021 02/15/2020  Transportation Screening Complete Complete  PCP or Specialist Appt within 5-7 Days - Not Complete  Not Complete comments - pending disposition  Home Care Screening - Complete   Medication Review (RN CM) - Referral to Pharmacy  Medication Review (RN Care Manager) Complete -  PCP or Specialist appointment within 3-5 days of discharge Complete -  [place] or Home Care Consult Complete -  SW Recovery Care/Counseling Consult Complete -  Palliative Care Screening Not Applicable -  Skilled Nursing Facility Complete -  Some recent data might be hidden

## 2021-10-23 LAB — GLUCOSE, CAPILLARY: Glucose-Capillary: 79 mg/dL (ref 70–99)

## 2021-11-04 ENCOUNTER — Emergency Department (HOSPITAL_COMMUNITY): Payer: No Typology Code available for payment source

## 2021-11-04 ENCOUNTER — Emergency Department (HOSPITAL_COMMUNITY)
Admission: EM | Admit: 2021-11-04 | Discharge: 2021-11-04 | Disposition: A | Discharge status: Home / Self Care | Payer: No Typology Code available for payment source | Attending: Emergency Medicine | Admitting: Emergency Medicine

## 2021-11-04 ENCOUNTER — Encounter (HOSPITAL_COMMUNITY): Payer: Self-pay

## 2021-11-04 DIAGNOSIS — N186 End stage renal disease: Secondary | ICD-10-CM | POA: Insufficient documentation

## 2021-11-04 DIAGNOSIS — S199XXA Unspecified injury of neck, initial encounter: Secondary | ICD-10-CM | POA: Diagnosis present

## 2021-11-04 DIAGNOSIS — Z7901 Long term (current) use of anticoagulants: Secondary | ICD-10-CM | POA: Diagnosis not present

## 2021-11-04 DIAGNOSIS — R799 Abnormal finding of blood chemistry, unspecified: Secondary | ICD-10-CM | POA: Diagnosis not present

## 2021-11-04 DIAGNOSIS — Z992 Dependence on renal dialysis: Secondary | ICD-10-CM | POA: Insufficient documentation

## 2021-11-04 DIAGNOSIS — Z8616 Personal history of COVID-19: Secondary | ICD-10-CM | POA: Insufficient documentation

## 2021-11-04 DIAGNOSIS — W06XXXA Fall from bed, initial encounter: Secondary | ICD-10-CM | POA: Diagnosis not present

## 2021-11-04 DIAGNOSIS — S12200A Unspecified displaced fracture of third cervical vertebra, initial encounter for closed fracture: Secondary | ICD-10-CM | POA: Insufficient documentation

## 2021-11-04 DIAGNOSIS — S0990XA Unspecified injury of head, initial encounter: Secondary | ICD-10-CM | POA: Diagnosis not present

## 2021-11-04 DIAGNOSIS — W19XXXA Unspecified fall, initial encounter: Secondary | ICD-10-CM

## 2021-11-04 LAB — I-STAT CHEM 8, ED
BUN: 66 mg/dL — ABNORMAL HIGH (ref 8–23)
Calcium, Ion: 1.05 mmol/L — ABNORMAL LOW (ref 1.15–1.40)
Chloride: 105 mmol/L (ref 98–111)
Creatinine, Ser: 15.7 mg/dL — ABNORMAL HIGH (ref 0.61–1.24)
Glucose, Bld: 76 mg/dL (ref 70–99)
HCT: 41 % (ref 39.0–52.0)
Hemoglobin: 13.9 g/dL (ref 13.0–17.0)
Potassium: 5.1 mmol/L (ref 3.5–5.1)
Sodium: 139 mmol/L (ref 135–145)
TCO2: 28 mmol/L (ref 22–32)

## 2021-11-04 LAB — CBG MONITORING, ED: Glucose-Capillary: 67 mg/dL — ABNORMAL LOW (ref 70–99)

## 2021-11-04 LAB — BASIC METABOLIC PANEL
Anion gap: 16 — ABNORMAL HIGH (ref 5–15)
BUN: 60 mg/dL — ABNORMAL HIGH (ref 8–23)
CO2: 23 mmol/L (ref 22–32)
Calcium: 8.7 mg/dL — ABNORMAL LOW (ref 8.9–10.3)
Chloride: 100 mmol/L (ref 98–111)
Creatinine, Ser: 14.81 mg/dL — ABNORMAL HIGH (ref 0.61–1.24)
GFR, Estimated: 3 mL/min — ABNORMAL LOW (ref >60)
Glucose, Bld: 81 mg/dL (ref 70–99)
Potassium: 5 mmol/L (ref 3.5–5.1)
Sodium: 139 mmol/L (ref 135–145)

## 2021-11-04 NOTE — ED Notes (Signed)
Pt IV infiltrated in CT for CTA. The radiologist provider saw pt and saw the site. IV removed. Will notify MD.

## 2021-11-04 NOTE — ED Notes (Signed)
IV team at bedside 

## 2021-11-04 NOTE — ED Notes (Signed)
Trauma Event Note    Interventions:  This TRN was rounding in the ED, assisted with placement of [place] J collar- while primary RN holding C-spine precautions.    Last imported Vital Signs BP (!) 165/94    Pulse 100    Temp 99.1 F (37.3 C) (Oral)    Resp 20    SpO2 97%   Trending CBC Recent Labs    11/04/21 0659  HGB 13.9  HCT 41.0    Trending Coag's No results for input(s): APTT, INR in the last 72 hours.  Trending BMET Recent Labs    11/04/21 0616 11/04/21 0659  NA 139 139  K 5.0 5.1  CL 100 105  CO2 23  --   BUN 60* 66*  CREATININE 14.81* 15.70*  GLUCOSE 81 [place] Response RN  Please call TRN at [phone] for further assistance.

## 2021-11-04 NOTE — ED Notes (Signed)
IV team consult placed at this time.

## 2021-11-04 NOTE — ED Provider Notes (Signed)
Care assumed from Margarita Mail at shift change, please see their note for full detail, but in brief Giorgio Chabot is a 66 y.o. male presents with fall sustaining C3 fracture.  Plan: Pending pelvic xray/chest xray, CTA - consult neurosurgery  Physical Exam  BP (!) 184/96    Pulse 93    Temp 99.1 F (37.3 C) (Oral)    Resp 20    SpO2 97%   Physical Exam  Procedures  Procedures  ED Course / MDM    Medical Decision Making Amount and/or Complexity of Data Reviewed Labs: ordered. Radiology: ordered.   Patient sustained a minimally displaced fracture of the C3 transverse foramen.  Trauma service applied long-term CT spine collar.  Pelvic and chest x-rays negative for acute fractures or traumas.  Unfortunately, patient was unable to have an IV placed in order to obtain a CT angio neck.  Multiple attempts were made and the IV team was called twice without success.  Since he has contracture of the right arm, only the left arm is available for use and he seems to be a bit fluid overloaded due to his need for dialysis.  I consulted with neurosurgery and spoke with Dr. Zada Finders and discussed all these findings with him.  He states patient does not need the CTA if we are unable to obtain IV access and patient can follow-up outpatient in 6 weeks.  Patient's vitals are otherwise stable, he is in no acute distress.  I did consider keeping patient here in order to emergently dialyze him, however he notes that he frequently misses his Saturday dialysis.  Additionally his potassium is normal and he has no fluid on his lungs with no respiratory distress.  Is reasonable for him to wait until his normal dialysis time tomorrow to have this done.  I discussed this plan with the patient and he had all questions asked and answered.  He is amenable and understanding.  Discharged home  in good condition.   [address], PA-C       Kathe Becton [place], [place] 11/04/21 1051    Fredia Sorrow,  MD 11/07/21 2352

## 2021-11-04 NOTE — Discharge Instructions (Addendum)
Your CT today shows that you have fractured the C3 vertebrae in your neck.  We put on a special collar that you need to keep on for the next 6 weeks before you follow-up with neurosurgery.  Fortunately, no surgery is required immediately at this time.  The rest of your work-up was very reassuring.  No signs of other fractures or blood work abnormalities.  You do need to attend dialysis tomorrow as you did not go on Saturday.  Follow-up with your primary care as needed.  Return if you sustain another injury or have new numbness or tingling. I have placed the referral for neurosurgery and you will need to call to set this appt up.

## 2021-11-04 NOTE — ED Triage Notes (Addendum)
Pt came in with c/o rolling off the bed approx one foot off the ground. Pt Covid positive. No LOC. Comes via EMS from [facility]. Pt is on Plavix. Dialysis pt T, Th, Sat. Did not go to dialysis Saturday. Swelling noted to R eye. A+O times four.

## 2021-11-04 NOTE — ED Notes (Signed)
Called ptar for pt , no eta given

## 2021-11-04 NOTE — Progress Notes (Signed)
°  Evaluation after Contrast Extravasation  Patient seen and examined immediately after contrast extravasation while in [facility] CT. Right upper arm IV extravasation with approximately 71 ml contrast injected.   Exam: There is no swelling at the right upper arm area.  There is no erythema. There is no discoloration. There are no blisters. There are no signs of decreased perfusion of the skin.  It is warm to touch.  The patient has limited ROM in fingers - history of arm contracture, right hand paralysis. Patient states he always has numbness/tingling to his right hand.  Radial pulse is normal.  Per contrast extravasation protocol, I have instructed the patient to keep an ice pack on the area for 20-60 minutes at a time for about 48 hours.   Keep arm elevated as much as possible.   The patient understands to call the radiology department if there is: - increase in pain or swelling - changed or altered sensation - ulceration or blistering - increasing redness - warmth or increasing firmness - decreased tissue perfusion as noted by decreased capillary refill or discoloration of skin - decreased pulses peripheral to site   Theresa Duty, NP 11/04/2021 7:38 AM

## 2021-11-04 NOTE — ED Notes (Signed)
Lunch tray ordered 

## 2021-11-04 NOTE — Progress Notes (Signed)
Assessed entire Right Arm. Unable to locate an appropriate vein for IV insertion at this time.

## 2021-11-04 NOTE — ED Notes (Signed)
Provider notified of pt CBG of 67. 2 cups of orange juice given to pt.

## 2021-11-04 NOTE — ED Notes (Signed)
Provider is aware of pt bp. Will continue to monitor.

## 2021-11-04 NOTE — ED Notes (Signed)
IV team unable to place IV at this time. Attempts made by IV team. Provider is aware. Pt has no IV access. Pt still needs CBC lab to be drawn. Will continue to monitor.

## 2021-11-04 NOTE — ED Provider Notes (Signed)
[facility] EMERGENCY DEPARTMENT Provider Note   CSN: [id number] Arrival date & time: 11/04/21  0123     History  Chief Complaint  Patient presents with   Lytle Michaels    Richard [name] is a 66 y.o. male with a past medical history of end-stage renal disease on hemodialysis Tuesday Thursday Saturday with partial dialysis yesterday, on Plavix.  He is a resident of [place] and has a history of CVA with right-sided spastic hemiparesis.  Patient apparently rolled out of bed and had a 1 foot fall to the floor.  He is unsure if he hit his head and does not have headache or changes in vision but does arrive in c-collar.  He has no complaints at this time.  Patient recently diagnosed with COVID   Fall      Home Medications Prior to Admission medications   Medication Sig Start Date End Date Taking? Authorizing Provider  ACCU-CHEK AVIVA PLUS test strip USE 1 strip TO test blood sugar 2 TIMES DAILY TO 3 TIMES DAILY 06/10/19   [name], MD  acetaminophen (TYLENOL) 325 MG tablet Take 650 mg by mouth every 6 (six) hours as needed for moderate pain, fever or mild pain.    [provider]  bictegravir-emtricitabine-tenofovir AF (BIKTARVY) 50-200-25 MG TABS tablet Take 1 tablet by mouth daily. 05/01/20   Oswald Hillock, MD  blood glucose meter kit and supplies KIT Dispense based on patient and insurance preference. Check blood sugar 4 times a day 06/11/18   [name], MD  calcium acetate, Phos Binder, (PHOSLYRA) 667 MG/5ML SOLN Take 1,334 mg by mouth 3 (three) times daily with meals.    [provider]  carvedilol (COREG) 12.5 MG tablet Take 12.5 mg by mouth in the morning and at bedtime.    [provider]  cetirizine (ZYRTEC) 10 MG tablet Take 10 mg by mouth daily.    [provider]  Cholecalciferol (VITAMIN D) 50 MCG (2000 UT) tablet Take 1 tablet (2,000 Units total) by mouth in the morning. Patient not taking: Reported  on 10/19/2021 03/07/20   [name], MD  cloNIDine (CATAPRES) 0.1 MG tablet Take 1 tablet (0.1 mg total) by mouth 3 (three) times daily. 02/21/21   Hosie Poisson, MD  clopidogrel (PLAVIX) 75 MG tablet Take 1 tablet (75 mg total) by mouth daily. 03/07/20   [name], MD  Darbepoetin Alfa (ARANESP) 60 MCG/0.3ML SOSY injection Inject 0.3 mLs (60 mcg total) into the vein every Saturday with hemodialysis. Patient not taking: Reported on 10/19/2021 01/19/21   Bonnielee Haff, MD  docusate sodium (COLACE) 100 MG capsule Take 100 mg by mouth daily.    [provider]  doxepin (SINEQUAN) 75 MG capsule Take 75 mg by mouth at bedtime.    [provider]  epoetin alfa-epbx (RETACRIT) 00938 UNIT/ML injection Inject 20,000 Units into the skin every Monday.    [provider]  Ergocalciferol 50 MCG (2000 UT) TABS Take 2,000 Units by mouth daily.    [provider]  hydrALAZINE (APRESOLINE) 25 MG tablet Take 1 tablet (25 mg total) by mouth every 8 (eight) hours. 10/22/21   Hosie Poisson, MD  insulin glargine (LANTUS) 100 unit/mL SOPN Inject 5 Units into the skin at bedtime.    [provider]  Insulin Pen Needle (SURE COMFORT PEN NEEDLES) 31G X 8 MM MISC Use as directed twice daily with Novolog flex pen 02/10/19   [name], MD  levETIRAcetam (KEPPRA) 500 MG tablet Take 500 mg by mouth in the morning and at bedtime.    [provider]  Lidocaine 3 % CREA Apply 1 application topically See admin instructions. Apply 1 times a day on Tuesday, Thursday and Saturday prior to dialysis    [provider]  Misc. Devices (COMMODE 3-IN-1) MISC Use as directed 02/24/20   [name], MD  Misc. Devices (TRANSFER BENCH) MISC Use as directed 02/24/20   [name], MD  Multiple Vitamin (MULTIVITAMIN WITH MINERALS) TABS tablet Take 1 tablet by mouth at bedtime.    [provider]  nicotine (NICODERM CQ - DOSED IN MG/24 HOURS) 14  mg/24hr patch Place 1 patch (14 mg total) onto the skin daily as needed (smoking cessation). 10/22/21   Hosie Poisson, MD  pantoprazole (PROTONIX) 40 MG tablet TAKE 1 TABLET BY MOUTH EVERY DAY Patient taking differently: 40 mg daily. 01/23/20   Koberlein, [name], MD  polyethylene glycol (MIRALAX / GLYCOLAX) 17 g packet Take 17 g by mouth daily as needed for severe constipation. 10/22/21   Hosie Poisson, MD  pravastatin (PRAVACHOL) 80 MG tablet Take 1 tablet (80 mg total) by mouth at bedtime. 03/07/20   [name], MD  sildenafil (VIAGRA) 100 MG tablet Take 100 mg by mouth daily as needed for erectile dysfunction.    [provider]  sulfamethoxazole-trimethoprim (BACTRIM DS) 800-160 MG tablet Take 1 tablet by mouth 3 (three) times a week. Patient taking differently: Take 1 tablet by mouth every Monday, Wednesday, and Friday. Once daily on M-W F continuous 04/23/20   Little Ishikawa, MD  tamsulosin (FLOMAX) 0.4 MG CAPS capsule TAKE 1 CAPSULE BY MOUTH EVERY DAY Patient taking differently: Take 0.4 mg by mouth daily. 03/28/20   [name], MD  traMADol (ULTRAM) 50 MG tablet Take 50 mg by mouth every 8 (eight) hours as needed for moderate pain. 05/01/21   [provider]  triamcinolone cream (KENALOG) 0.1 % Apply 1 application topically 2 (two) times daily. To arms and legs    [provider]  TRUEPLUS INSULIN SYRINGE 31G X 5/16" 1 ML MISC Use pen needle with insulin 2 times daily 03/31/19   [name], MD      Allergies    Patient has no known allergies.    Review of Systems   Review of Systems  Physical Exam Updated Vital Signs There were no vitals taken for this visit. Physical Exam Vitals and nursing note reviewed.  Constitutional:      General: He is not in acute distress.    Appearance: He is well-developed. He is not diaphoretic.  HENT:     Head: Normocephalic and atraumatic.  Eyes:     General: No scleral icterus.     Conjunctiva/sclera: Conjunctivae normal.  Neck:     Comments: C collar in place Cardiovascular:     Rate and Rhythm: Normal rate and regular rhythm.     Heart sounds: Normal heart sounds.     Comments: Left upper extremity with dialysis access, palpable thrill Pulmonary:     Effort: Pulmonary effort is normal. No respiratory distress.     Breath sounds: Normal breath sounds.  Abdominal:     Palpations: Abdomen is soft.     Tenderness: There is no abdominal tenderness.  Musculoskeletal:     Cervical back: Normal range of motion and neck supple.  Skin:    General: Skin is warm and dry.  Neurological:  Mental Status: He is alert.  Psychiatric:        Behavior: Behavior normal.    ED Results / Procedures / Treatments   Labs (all labs ordered are listed, but only abnormal results are displayed) Labs Reviewed - No data to display  EKG None  Radiology No results found.  Procedures Procedures    Medications Ordered in ED Medications - No data to display  ED Course/ Medical Decision Making/ A&P                           Medical Decision Making 66 y/o male who presents from snf after 1 foot fall.  The patient arrives with c spine precauations.  CYt head w/o acute finding Ct cspine shows C3 transverse foramen fracture which represents potential for injury to the vertebral artery. Case discussed with Dr. Kerrie Buffalo radiology.  I viewed and interpreted these images. I orderd a portable chest and pelis xray. We have had significant difficulty obtaining access on the patient for CT angiogram.  Labs are pending. I discussed the case at shift end with PA Iline Oven who will assume care of the patient.  Amount and/or Complexity of Data Reviewed Labs: ordered. Radiology: ordered and independent interpretation performed.  Final Clinical Impression(s) / ED Diagnoses Final diagnoses:  None    Rx / DC Orders ED Discharge Orders     None         Margarita Mail,  PA-C 11/05/21 1726    Orpah Greek, MD 11/09/21 [phone]

## 2021-11-11 ENCOUNTER — Encounter (HOSPITAL_COMMUNITY): Payer: Self-pay

## 2021-11-28 ENCOUNTER — Encounter (HOSPITAL_COMMUNITY): Payer: Self-pay | Admitting: Radiology

## 2021-11-29 ENCOUNTER — Encounter: Payer: Self-pay | Admitting: Infectious Disease

## 2021-11-29 ENCOUNTER — Telehealth: Payer: Self-pay

## 2021-11-29 ENCOUNTER — Ambulatory Visit (INDEPENDENT_AMBULATORY_CARE_PROVIDER_SITE_OTHER): Payer: Medicare HMO | Admitting: Infectious Disease

## 2021-11-29 ENCOUNTER — Other Ambulatory Visit: Payer: Self-pay

## 2021-11-29 VITALS — BP 138/82 | HR 87 | Temp 97.9°F | Resp 16 | Ht 72.0 in | Wt 165.0 lb

## 2021-11-29 DIAGNOSIS — E785 Hyperlipidemia, unspecified: Secondary | ICD-10-CM | POA: Diagnosis not present

## 2021-11-29 DIAGNOSIS — B20 Human immunodeficiency virus [HIV] disease: Secondary | ICD-10-CM | POA: Diagnosis not present

## 2021-11-29 DIAGNOSIS — M7918 Myalgia, other site: Secondary | ICD-10-CM

## 2021-11-29 DIAGNOSIS — N186 End stage renal disease: Secondary | ICD-10-CM | POA: Diagnosis not present

## 2021-11-29 DIAGNOSIS — I63522 Cerebral infarction due to unspecified occlusion or stenosis of left anterior cerebral artery: Secondary | ICD-10-CM

## 2021-11-29 DIAGNOSIS — Z992 Dependence on renal dialysis: Secondary | ICD-10-CM

## 2021-11-29 MED ORDER — DOVATO 50-300 MG PO TABS: 1.0000 | ORAL_TABLET | Freq: Every day | ORAL | 11 refills | Status: DC

## 2021-11-29 NOTE — Telephone Encounter (Signed)
Sent paper and office notes with patient to [facility] stating he is to take Dovato 1 time daily with food at the same time every single day. I also alerted them that we will schedule MRI for Right Buttock pain and someone will call them with the date and time.  ?

## 2021-11-29 NOTE — Progress Notes (Signed)
Subjective:  Chief complaint he is complaining of a sore on his buttocks  Patient ID: Richard [name], male    DOB: Nov 11, 1955, 66 y.o.   MRN: [id number]  HPI  Richard [name] is a now 66 year old black man -Birthday today!, living with multiple medical problems including poorly controlled diabetes hypertension end-stage renal disease on hemodialysis prior stroke who was admitted to the hospital with seizures and encephalopathy in 2021.  He was found have HIV and AIDS with a CD4 count of 39.  During that hospitalization we started dolutegravir and lamivudine.  Also started Bactrim for PCP prevention.  He was supposed to see me in September 2021 but has not been seen by [place] since then.  He now is residing in skilled [place] and he was on [place] rather than Dovato.  Is not clear at all who made the switch in the antiviral regimen.  Regardless his viral load is suppressed and his CD4 count Actually reconstituting the he does not yet need criteria for having a CD4 above 200 for 6 months to stop his Bactrim for PCP prophylaxis.  He continues complain of buttocks pain when I saw him as well.  Do not see BIKTARVY on his med list at all and last time it was filled by the pharmacy that was delivering it was in February.  I am concerned that he is not on antiviral medication which is responsible.            Past Medical History:  Diagnosis Date   COVID-19 05/16/2020   Diabetes mellitus, type II, insulin dependent ([place])    ESRD on hemodialysis ([place]) 08/30/2021   GERD (gastroesophageal reflux disease)    HIV (human immunodeficiency virus infection) ([place])    Hypertension    Meningoencephalitis    Neuropathy in diabetes ([place])    bilat feet   Ruptured lumbar disc    Seizure disorder ([place])    Stroke ([place]) 2013   residual right sided weakness (arm>leg)    Past Surgical History:  Procedure Laterality Date   AV FISTULA PLACEMENT Left 04/25/2020   Procedure: LEFT ARM  BRACHIOCEPHALIC ARTERIOVENOUS (AV) FISTULA CREATION;  Surgeon: Rosetta Posner, MD;  Location: [facility] OR;  Service: Vascular;  Laterality: Left;   IR FLUORO GUIDE CV LINE RIGHT  04/13/2020   IR REMOVAL TUN CV CATH W/O FL  01/14/2021   IR US GUIDE VASC ACCESS RIGHT  04/13/2020   KNEE ARTHROSCOPY Bilateral     Family History  Problem Relation Age of Onset   Other Mother    Stroke Father    Hypertension Father    Epilepsy Father    Breast cancer Sister    Stroke Brother    Heart attack Brother    Diabetes Sister       Social History   Socioeconomic History   Marital status: Single    Spouse name: Not on file   Number of children: 2   Years of education: 12   Highest education level: 12th grade  Occupational History   Occupation: disabled  Tobacco Use   Smoking status: Every Day    Packs/day: 1.00    Years: 35.00    Pack years: 35.00    Types: Cigarettes    Passive exposure: Current   Smokeless tobacco: Never   Tobacco comments:    Currently smoking about 0.5ppd  Vaping Use   Vaping Use: Never used  Substance and Sexual Activity   Alcohol use: No   Drug use:  Not Currently    Types: Marijuana   Sexual activity: Not Currently    Comment: declined condoms  Other Topics Concern   Not on file  Social History Narrative   Not on file   Social Determinants of Health   Financial Resource Strain: Low Risk    Difficulty of Paying Living Expenses: Not very hard  Food Insecurity: No Food Insecurity   Worried About Running Out of Food in the Last Year: Never true   Ran Out of Food in the Last Year: Never true  Transportation Needs: No Transportation Needs   Lack of Transportation (Medical): No   Lack of Transportation (Non-Medical): No  Physical Activity: Inactive   Days of Exercise per Week: 0 days   Minutes of Exercise per Session: 0 min  Stress: Stress Concern Present   Feeling of Stress : Very much  Social Connections: Socially Isolated   Frequency of Communication with  Friends and Family: More than three times a week   Frequency of Social Gatherings with Friends and Family: More than three times a week   Attends Religious Services: Never   Marine scientist or Organizations: No   Attends Music therapist: Never   Marital Status: Divorced    No Known Allergies    Current Outpatient Medications:    ACCU-CHEK AVIVA PLUS test strip, USE 1 strip TO test blood sugar 2 TIMES DAILY TO 3 TIMES DAILY, Disp: 100 strip, Rfl: 1   acetaminophen (TYLENOL) 325 MG tablet, Take 650 mg by mouth every 6 (six) hours as needed for moderate pain, fever or mild pain., Disp: , Rfl:    bictegravir-emtricitabine-tenofovir AF (BIKTARVY) 50-200-25 MG TABS tablet, Take 1 tablet by mouth daily., Disp: 30 tablet, Rfl: 0   blood glucose meter kit and supplies KIT, Dispense based on patient and insurance preference. Check blood sugar 4 times a day, Disp: 1 each, Rfl: 0   calcium acetate, Phos Binder, (PHOSLYRA) 667 MG/5ML SOLN, Take 1,334 mg by mouth 3 (three) times daily with meals., Disp: , Rfl:    carvedilol (COREG) 12.5 MG tablet, Take 12.5 mg by mouth in the morning and at bedtime., Disp: , Rfl:    cetirizine (ZYRTEC) 10 MG tablet, Take 10 mg by mouth daily., Disp: , Rfl:    Cholecalciferol (VITAMIN D) 50 MCG (2000 UT) tablet, Take 1 tablet (2,000 Units total) by mouth in the morning., Disp: 30 tablet, Rfl: 0   cloNIDine (CATAPRES) 0.1 MG tablet, Take 1 tablet (0.1 mg total) by mouth 3 (three) times daily., Disp: 60 tablet, Rfl: 11   clopidogrel (PLAVIX) 75 MG tablet, Take 1 tablet (75 mg total) by mouth daily., Disp: 90 tablet, Rfl: 0   Darbepoetin Alfa (ARANESP) 60 MCG/0.3ML SOSY injection, Inject 0.3 mLs (60 mcg total) into the vein every Saturday with hemodialysis., Disp: 4.2 mL, Rfl:    docusate sodium (COLACE) 100 MG capsule, Take 100 mg by mouth daily., Disp: , Rfl:    doxepin (SINEQUAN) 75 MG capsule, Take 75 mg by mouth at bedtime., Disp: , Rfl:    epoetin  alfa-epbx (RETACRIT) 25956 UNIT/ML injection, Inject 20,000 Units into the skin every Monday., Disp: , Rfl:    Ergocalciferol 50 MCG (2000 UT) TABS, Take 2,000 Units by mouth daily., Disp: , Rfl:    hydrALAZINE (APRESOLINE) 25 MG tablet, Take 1 tablet (25 mg total) by mouth every 8 (eight) hours., Disp: 90 tablet, Rfl: 3   insulin glargine (LANTUS) 100 unit/mL SOPN, Inject 5  Units into the skin at bedtime., Disp: , Rfl:    Insulin Pen Needle (SURE COMFORT PEN NEEDLES) 31G X 8 MM MISC, Use as directed twice daily with Novolog flex pen, Disp: 100 each, Rfl: 3   levETIRAcetam (KEPPRA) 500 MG tablet, Take 500 mg by mouth in the morning and at bedtime., Disp: , Rfl:    Lidocaine 3 % CREA, Apply 1 application topically See admin instructions. Apply 1 times a day on Tuesday, Thursday and Saturday prior to dialysis, Disp: , Rfl:    Misc. Devices (COMMODE 3-IN-1) MISC, Use as directed, Disp: 1 each, Rfl: 0   Misc. Devices (TRANSFER BENCH) MISC, Use as directed, Disp: 1 each, Rfl: 0   Multiple Vitamin (MULTIVITAMIN WITH MINERALS) TABS tablet, Take 1 tablet by mouth at bedtime., Disp: , Rfl:    nicotine (NICODERM CQ - DOSED IN MG/24 HOURS) 14 mg/24hr patch, Place 1 patch (14 mg total) onto the skin daily as needed (smoking cessation)., Disp: 28 patch, Rfl: 0   pantoprazole (PROTONIX) 40 MG tablet, TAKE 1 TABLET BY MOUTH EVERY DAY (Patient taking differently: 40 mg daily.), Disp: 90 tablet, Rfl: 0   polyethylene glycol (MIRALAX / GLYCOLAX) 17 g packet, Take 17 g by mouth daily as needed for severe constipation., Disp: 14 each, Rfl: 0   pravastatin (PRAVACHOL) 80 MG tablet, Take 1 tablet (80 mg total) by mouth at bedtime., Disp: 90 tablet, Rfl: 0   sildenafil (VIAGRA) 100 MG tablet, Take 100 mg by mouth daily as needed for erectile dysfunction., Disp: , Rfl:    sulfamethoxazole-trimethoprim (BACTRIM DS) 800-160 MG tablet, Take 1 tablet by mouth 3 (three) times a week. (Patient taking differently: Take 1 tablet by  mouth every Monday, Wednesday, and Friday. Once daily on M-W F continuous), Disp: 30 tablet, Rfl: 0   tamsulosin (FLOMAX) 0.4 MG CAPS capsule, TAKE 1 CAPSULE BY MOUTH EVERY DAY (Patient taking differently: Take 0.4 mg by mouth daily.), Disp: 30 capsule, Rfl: 0   traMADol (ULTRAM) 50 MG tablet, Take 50 mg by mouth every 8 (eight) hours as needed for moderate pain., Disp: , Rfl:    triamcinolone cream (KENALOG) 0.1 %, Apply 1 application topically 2 (two) times daily. To arms and legs, Disp: , Rfl:    TRUEPLUS INSULIN SYRINGE 31G X 5/16" 1 ML MISC, Use pen needle with insulin 2 times daily, Disp: 100 each, Rfl: 1    Review of Systems  Unable to perform ROS: Dementia  Constitutional:  Negative for activity change, appetite change, chills, diaphoresis, fatigue, fever and unexpected weight change.  HENT:  Negative for congestion, rhinorrhea, sinus pressure, sneezing, sore throat and trouble swallowing.   Eyes:  Negative for photophobia and visual disturbance.  Respiratory:  Negative for cough, chest tightness, shortness of breath, wheezing and stridor.   Cardiovascular:  Negative for chest pain, palpitations and leg swelling.  Gastrointestinal:  Negative for abdominal distention, abdominal pain, anal bleeding, blood in stool, constipation, diarrhea, nausea and vomiting.  Genitourinary:  Negative for difficulty urinating, dysuria, flank pain and hematuria.  Musculoskeletal:  Negative for arthralgias, back pain, gait problem, joint swelling and myalgias.  Skin:  Positive for wound. Negative for color change, pallor and rash.  Neurological:  Negative for dizziness, tremors, weakness and light-headedness.  Hematological:  Negative for adenopathy. Does not bruise/bleed easily.  Psychiatric/Behavioral:  Negative for agitation, behavioral problems, confusion, decreased concentration, dysphoric mood and sleep disturbance.       Objective:   Physical Exam Constitutional:  Appearance: He is  well-developed.  HENT:     Head: Normocephalic and atraumatic.  Eyes:     Conjunctiva/sclera: Conjunctivae normal.  Cardiovascular:     Rate and Rhythm: Normal rate and regular rhythm.  Pulmonary:     Effort: Pulmonary effort is normal. No respiratory distress.     Breath sounds: No wheezing.  Abdominal:     General: There is no distension.     Palpations: Abdomen is soft.  Musculoskeletal:        General: No tenderness. Normal range of motion.     Cervical back: Normal range of motion and neck supple.  Skin:    General: Skin is warm and dry.     Coloration: Skin is not pale.     Findings: No erythema or rash.  Neurological:     Mental Status: He is alert and oriented to person, place, and time.     Comments: He is paralyzed in the right side.    Psychiatric:        Attention and Perception: Attention normal.        Mood and Affect: Mood normal.        Behavior: Behavior normal.        Thought Content: Thought content normal.        Cognition and Memory: Memory is impaired.        Judgment: Judgment normal.    Buttocks:  No ulcers but he has a palpable firm hard mass in the right buttocks        Assessment & Plan:  HIV/AIDS:  He appears to be without antiretrovirals which is very disturbing to me.  I have gotten 28 days of Dovato from samples we have in the clinic I am going to send him back to his skilled nursing facility with this.  Have also sent a prescription for Dovato to the pharmacy on record which I am assuming is dispensing the drugs to the skilled nursing facility.  Note he should not be taking Dovato at the same time as calcium or magnesium or iron unless he is also eating at the same time.   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  Her MRI without contrast of the pelvis to evaluate this area.   Hyperlipidemia continue  pravastatin  Hypertension: Much better controlled today pretension  Today's Vitals   11/29/21 1405  BP: 138/82  Pulse: 87  Resp: 16  Temp: 97.9 F (36.6 C)  SpO2: 98%  Weight: 165 lb (74.8 kg)  Height: 6' (1.829 m)  PainSc: 8    Body mass index is 22.38 kg/m.  CVA with right sided hemip view of right-sided hemiplegia: Continue Plavix medical Coreg.  ESRD disease on hemodialysis Tuesday Thursday Saturday.  I spent 43  minutes with the patient including than 50% of the time in face to face counseling of the patient is HIV AIDS, his buttocks pain and stage renal disease, along with review of medical records in preparation for the visit and during the visit and in coordination of his care.

## 2021-12-11 ENCOUNTER — Ambulatory Visit (HOSPITAL_COMMUNITY)
Admission: RE | Admit: 2021-12-11 | Discharge: 2021-12-11 | Disposition: A | Discharge status: Home / Self Care | Payer: Medicare HMO | Source: Ambulatory Visit | Attending: Infectious Disease | Admitting: Infectious Disease

## 2021-12-11 ENCOUNTER — Other Ambulatory Visit: Payer: Self-pay | Admitting: Infectious Disease

## 2021-12-11 ENCOUNTER — Telehealth: Payer: Self-pay

## 2021-12-11 ENCOUNTER — Ambulatory Visit: Payer: Medicare HMO | Admitting: Infectious Disease

## 2021-12-11 ENCOUNTER — Other Ambulatory Visit: Payer: Self-pay

## 2021-12-11 DIAGNOSIS — M7918 Myalgia, other site: Secondary | ICD-10-CM

## 2021-12-11 NOTE — Telephone Encounter (Signed)
We received lab results for the patient and per Dr. Tommy Medal a HIV viral load was drawn instead of a viral load.  ?I spoke to Lelon Frohlich at [facility] dialysis center and requested that a viral load and CD4 be drawn on the patient at his next dialysis appointment (tomorrow). Per Lelon Frohlich she would like the order to be faxed to them and they will draw this tomorrow.  ?Orders have been faxed.  ?

## 2021-12-12 ENCOUNTER — Other Ambulatory Visit: Payer: Self-pay | Admitting: Pharmacist

## 2021-12-12 DIAGNOSIS — B20 Human immunodeficiency virus [HIV] disease: Secondary | ICD-10-CM

## 2021-12-12 MED ORDER — DOVATO 50-300 MG PO TABS: 1.0000 | ORAL_TABLET | Freq: Every day | ORAL | 0 refills | Status: AC

## 2021-12-12 NOTE — Progress Notes (Signed)
Medication Samples have been provided to the patient. ? ?Drug name: Dovato        ?Strength: 50/300 mg         ?Qty: 28  Tablets (2 bottles) ?LOT: [id number]   ?Exp.Date: 9/24 ? ?Dosing instructions: Take one tablet by mouth once daily ? ?The patient has been instructed regarding the correct time, dose, and frequency of taking this medication, including desired effects and most common side effects.  ? ?Alfonse Spruce, PharmD, CPP ?Clinical Pharmacist Practitioner ?Infectious Diseases Clinical Pharmacist ?[facility] ? ?

## 2021-12-16 ENCOUNTER — Telehealth: Payer: Self-pay

## 2021-12-16 ENCOUNTER — Encounter: Payer: Self-pay | Admitting: Infectious Disease

## 2021-12-16 ENCOUNTER — Other Ambulatory Visit: Payer: Self-pay

## 2021-12-16 ENCOUNTER — Ambulatory Visit (INDEPENDENT_AMBULATORY_CARE_PROVIDER_SITE_OTHER): Payer: Medicare HMO | Admitting: Infectious Disease

## 2021-12-16 VITALS — BP 179/108 | HR 80 | Temp 97.9°F

## 2021-12-16 DIAGNOSIS — B20 Human immunodeficiency virus [HIV] disease: Secondary | ICD-10-CM | POA: Diagnosis not present

## 2021-12-16 DIAGNOSIS — R1900 Intra-abdominal and pelvic swelling, mass and lump, unspecified site: Secondary | ICD-10-CM

## 2021-12-16 DIAGNOSIS — Z992 Dependence on renal dialysis: Secondary | ICD-10-CM

## 2021-12-16 DIAGNOSIS — I160 Hypertensive urgency: Secondary | ICD-10-CM

## 2021-12-16 DIAGNOSIS — I5033 Acute on chronic diastolic (congestive) heart failure: Secondary | ICD-10-CM | POA: Diagnosis not present

## 2021-12-16 DIAGNOSIS — N186 End stage renal disease: Secondary | ICD-10-CM

## 2021-12-16 DIAGNOSIS — M7918 Myalgia, other site: Secondary | ICD-10-CM

## 2021-12-16 DIAGNOSIS — R935 Abnormal findings on diagnostic imaging of other abdominal regions, including retroperitoneum: Secondary | ICD-10-CM

## 2021-12-16 NOTE — Telephone Encounter (Signed)
Spoke with Ebony Hail, nurse at [facility], she does not see where a viral load was collected on the patient. ? ?Faxed orders per Dr. Tommy Medal for dialysis center to collect CRP, ESR, CD4, and HIV RNA quant at patient's dialysis session tomorrow.  ? ?Orders faxed to: [place] ?P: [phone] ?F: [phone] ? ?Requested that results be faxed to Dr. Tommy Medal. ? ?Beryle Flock, RN ? ?

## 2021-12-16 NOTE — Progress Notes (Signed)
? ?Subjective:  ?Chief complaint  ? Patient ID: Richard [name], male    DOB: [birth year], 66 y.o.   MRN: [id number] ? ?HPI ? ?Richard [name] is a now 66 year old black man -Birthday today!, living with multiple medical problems including poorly controlled diabetes hypertension end-stage renal disease on hemodialysis prior stroke who was admitted to the hospital with seizures and encephalopathy in 2021.  He was found have HIV and AIDS with a CD4 count of 39. ? ?During that hospitalization we started dolutegravir and lamivudine. ? ?Also started Bactrim for PCP prevention.  He was supposed to see me in September 2021 but has not been seen by [place] since then. ? ?He now is residing in skilled [place] and he was on [place] rather than Dovato. ? ?Is not clear at all who made the switch in the antiviral regimen. ? ?Regardless his viral load is suppressed and his CD4 count Actually reconstituting the he does not yet need criteria for having a CD4 above 200 for 6 months to stop his Bactrim for PCP prophylaxis. ? ?He continued complain of buttocks pain on every single visit that I have seen him. ? ?Do not see BIKTARVY on his med list at all and last time it was filled by the pharmacy that was delivering it was in 10/27/2022. ? ?I was concerned he was not on antivirals and I gave him Dovato samples in person.  It now appears that Dovato is on his medical record at skilled facility. ? ?We asked for his dialysis center to draw labs for him but they drew an HIV antibody rather than HIV RNA which is what we needed. ? ?I believe they have ordered a subsequent HIV RNA but I am not sure if its been drawn yet. ? ? ? ? ? ? ? ? ? ? ? ? ? ?Past Medical History:  ?Diagnosis Date  ? COVID-19 05/16/2020  ? Diabetes mellitus, type II, insulin dependent ([place])   ? ESRD on hemodialysis ([place]) 08/30/2021  ? GERD (gastroesophageal reflux disease)   ? HIV (human immunodeficiency virus infection) ([place])   ? Hypertension   ?  Meningoencephalitis   ? Neuropathy in diabetes [facility])   ? bilat feet  ? Ruptured lumbar disc   ? Seizure disorder ([place])   ? Stroke [facility]) 2013  ? residual right sided weakness (arm>leg)  ? ? ?Past Surgical History:  ?Procedure Laterality Date  ? AV FISTULA PLACEMENT Left 04/25/2020  ? Procedure: LEFT ARM BRACHIOCEPHALIC ARTERIOVENOUS (AV) FISTULA CREATION;  Surgeon: Richard Posner, MD;  Location: [facility] OR;  Service: Vascular;  Laterality: Left;  ? IR FLUORO GUIDE CV LINE RIGHT  04/13/2020  ? IR REMOVAL TUN CV CATH W/O FL  01/14/2021  ? IR US GUIDE VASC ACCESS RIGHT  04/13/2020  ? KNEE ARTHROSCOPY Bilateral   ? ? ?Family History  ?Problem Relation Age of Onset  ? Other Mother   ? Stroke Father   ? Hypertension Father   ? Epilepsy Father   ? Breast cancer Sister   ? Stroke Brother   ? Heart attack Brother   ? Diabetes Sister   ? ? ?  ?Social History  ? ?Socioeconomic History  ? Marital status: Single  ?  Spouse name: Not on file  ? Number of children: 2  ? Years of education: 43  ? Highest education level: 12th grade  ?Occupational History  ? Occupation: disabled  ?Tobacco Use  ? Smoking status: Every Day  ?  Packs/day: 0.50  ?  Years: 35.00  ?  Pack years: 17.50  ?  Types: Cigarettes  ?  Passive exposure: Current  ? Smokeless tobacco: Never  ? Tobacco comments:  ?  Currently smoking about 0.5ppd  ?Vaping Use  ? Vaping Use: Never used  ?Substance and Sexual Activity  ? Alcohol use: No  ? Drug use: Not Currently  ?  Types: Marijuana  ? Sexual activity: Not Currently  ?  Comment: declined condoms  ?Other Topics Concern  ? Not on file  ?Social History Narrative  ? Not on file  ? ?Social Determinants of Health  ? ?Financial Resource Strain: Low Risk   ? Difficulty of Paying Living Expenses: Not very hard  ?Food Insecurity: No Food Insecurity  ? Worried About Charity fundraiser in the Last Year: Never true  ? Ran Out of Food in the Last Year: Never true  ?Transportation Needs: No Transportation Needs  ? Lack of Transportation  (Medical): No  ? Lack of Transportation (Non-Medical): No  ?Physical Activity: Inactive  ? Days of Exercise per Week: 0 days  ? Minutes of Exercise per Session: 0 min  ?Stress: Stress Concern Present  ? Feeling of Stress : Very much  ?Social Connections: Socially Isolated  ? Frequency of Communication with Friends and Family: More than three times a week  ? Frequency of Social Gatherings with Friends and Family: More than three times a week  ? Attends Religious Services: Never  ? Active Member of Clubs or Organizations: No  ? Attends Archivist Meetings: Never  ? Marital Status: Divorced  ? ? ?No Known Allergies ? ? ? ?Current Outpatient Medications:  ?  ACCU-CHEK AVIVA PLUS test strip, USE 1 strip TO test blood sugar 2 TIMES DAILY TO 3 TIMES DAILY, Disp: 100 strip, Rfl: 1 ?  acetaminophen (TYLENOL) 325 MG tablet, Take 650 mg by mouth every 6 (six) hours as needed for moderate pain, fever or mild pain., Disp: , Rfl:  ?  blood glucose meter kit and supplies KIT, Dispense based on patient and insurance preference. Check blood sugar 4 times a day, Disp: 1 each, Rfl: 0 ?  calcium acetate, Phos Binder, (PHOSLYRA) 667 MG/5ML SOLN, Take 1,334 mg by mouth 3 (three) times daily with meals., Disp: , Rfl:  ?  carvedilol (COREG) 12.5 MG tablet, Take 12.5 mg by mouth in the morning and at bedtime., Disp: , Rfl:  ?  cetirizine (ZYRTEC) 10 MG tablet, Take 10 mg by mouth daily., Disp: , Rfl:  ?  Cholecalciferol (VITAMIN D) 50 MCG (2000 UT) tablet, Take 1 tablet (2,000 Units total) by mouth in the morning., Disp: 30 tablet, Rfl: 0 ?  cloNIDine (CATAPRES) 0.1 MG tablet, Take 1 tablet (0.1 mg total) by mouth 3 (three) times daily., Disp: 60 tablet, Rfl: 11 ?  clopidogrel (PLAVIX) 75 MG tablet, Take 1 tablet (75 mg total) by mouth daily., Disp: 90 tablet, Rfl: 0 ?  Darbepoetin Alfa (ARANESP) 60 MCG/0.3ML SOSY injection, Inject 0.3 mLs (60 mcg total) into the vein every Saturday with hemodialysis., Disp: 4.2 mL, Rfl:  ?   docusate sodium (COLACE) 100 MG capsule, Take 100 mg by mouth daily., Disp: , Rfl:  ?  dolutegravir-lamiVUDine (DOVATO) 50-300 MG tablet, Take 1 tablet by mouth daily., Disp: 30 tablet, Rfl: 11 ?  dolutegravir-lamiVUDine (DOVATO) 50-300 MG tablet, Take 1 tablet by mouth daily for 28 days., Disp: 28 tablet, Rfl: 0 ?  doxepin (SINEQUAN) 75 MG capsule, Take 75 mg by mouth at  bedtime., Disp: , Rfl:  ?  epoetin alfa-epbx (RETACRIT) 85631 UNIT/ML injection, Inject 20,000 Units into the skin every Monday., Disp: , Rfl:  ?  Ergocalciferol 50 MCG (2000 UT) TABS, Take 2,000 Units by mouth daily., Disp: , Rfl:  ?  hydrALAZINE (APRESOLINE) 25 MG tablet, Take 1 tablet (25 mg total) by mouth every 8 (eight) hours., Disp: 90 tablet, Rfl: 3 ?  insulin glargine (LANTUS) 100 unit/mL SOPN, Inject 5 Units into the skin at bedtime., Disp: , Rfl:  ?  Insulin Pen Needle (SURE COMFORT PEN NEEDLES) 31G X 8 MM MISC, Use as directed twice daily with Novolog flex pen, Disp: 100 each, Rfl: 3 ?  levETIRAcetam (KEPPRA) 500 MG tablet, Take 500 mg by mouth in the morning and at bedtime., Disp: , Rfl:  ?  Lidocaine 3 % CREA, Apply 1 application topically See admin instructions. Apply 1 times a day on Tuesday, Thursday and Saturday prior to dialysis, Disp: , Rfl:  ?  Misc. Devices (COMMODE 3-IN-1) MISC, Use as directed, Disp: 1 each, Rfl: 0 ?  Misc. Devices (TRANSFER BENCH) MISC, Use as directed, Disp: 1 each, Rfl: 0 ?  Multiple Vitamin (MULTIVITAMIN WITH MINERALS) TABS tablet, Take 1 tablet by mouth at bedtime., Disp: , Rfl:  ?  nicotine (NICODERM CQ - DOSED IN MG/24 HOURS) 14 mg/24hr patch, Place 1 patch (14 mg total) onto the skin daily as needed (smoking cessation)., Disp: 28 patch, Rfl: 0 ?  pantoprazole (PROTONIX) 40 MG tablet, TAKE 1 TABLET BY MOUTH EVERY DAY (Patient taking differently: 40 mg daily.), Disp: 90 tablet, Rfl: 0 ?  polyethylene glycol (MIRALAX / GLYCOLAX) 17 g packet, Take 17 g by mouth daily as needed for severe constipation.,  Disp: 14 each, Rfl: 0 ?  pravastatin (PRAVACHOL) 80 MG tablet, Take 1 tablet (80 mg total) by mouth at bedtime., Disp: 90 tablet, Rfl: 0 ?  sildenafil (VIAGRA) 100 MG tablet, Take 100 mg by mouth daily as needed for erectile dy

## 2021-12-19 ENCOUNTER — Encounter (HOSPITAL_COMMUNITY): Payer: Self-pay | Admitting: Emergency Medicine

## 2021-12-19 ENCOUNTER — Emergency Department (HOSPITAL_COMMUNITY)
Admission: EM | Admit: 2021-12-19 | Discharge: 2021-12-20 | Disposition: A | Discharge status: Skilled Nursing Facility | Payer: No Typology Code available for payment source | Attending: Emergency Medicine | Admitting: Emergency Medicine

## 2021-12-19 ENCOUNTER — Emergency Department (HOSPITAL_COMMUNITY): Payer: No Typology Code available for payment source

## 2021-12-19 DIAGNOSIS — Z7902 Long term (current) use of antithrombotics/antiplatelets: Secondary | ICD-10-CM | POA: Insufficient documentation

## 2021-12-19 DIAGNOSIS — Z79899 Other long term (current) drug therapy: Secondary | ICD-10-CM | POA: Diagnosis not present

## 2021-12-19 DIAGNOSIS — Z794 Long term (current) use of insulin: Secondary | ICD-10-CM | POA: Diagnosis not present

## 2021-12-19 DIAGNOSIS — M25512 Pain in left shoulder: Secondary | ICD-10-CM | POA: Diagnosis not present

## 2021-12-19 DIAGNOSIS — I120 Hypertensive chronic kidney disease with stage 5 chronic kidney disease or end stage renal disease: Secondary | ICD-10-CM | POA: Diagnosis not present

## 2021-12-19 DIAGNOSIS — Z8616 Personal history of COVID-19: Secondary | ICD-10-CM | POA: Diagnosis not present

## 2021-12-19 DIAGNOSIS — M542 Cervicalgia: Secondary | ICD-10-CM | POA: Insufficient documentation

## 2021-12-19 DIAGNOSIS — N186 End stage renal disease: Secondary | ICD-10-CM | POA: Diagnosis not present

## 2021-12-19 DIAGNOSIS — S0081XA Abrasion of other part of head, initial encounter: Secondary | ICD-10-CM | POA: Insufficient documentation

## 2021-12-19 DIAGNOSIS — Z992 Dependence on renal dialysis: Secondary | ICD-10-CM | POA: Insufficient documentation

## 2021-12-19 DIAGNOSIS — W050XXA Fall from non-moving wheelchair, initial encounter: Secondary | ICD-10-CM | POA: Insufficient documentation

## 2021-12-19 DIAGNOSIS — I609 Nontraumatic subarachnoid hemorrhage, unspecified: Secondary | ICD-10-CM | POA: Insufficient documentation

## 2021-12-19 DIAGNOSIS — W19XXXA Unspecified fall, initial encounter: Secondary | ICD-10-CM

## 2021-12-19 DIAGNOSIS — S0990XA Unspecified injury of head, initial encounter: Secondary | ICD-10-CM | POA: Diagnosis present

## 2021-12-19 DIAGNOSIS — E1122 Type 2 diabetes mellitus with diabetic chronic kidney disease: Secondary | ICD-10-CM | POA: Insufficient documentation

## 2021-12-19 DIAGNOSIS — Z21 Asymptomatic human immunodeficiency virus [HIV] infection status: Secondary | ICD-10-CM | POA: Diagnosis not present

## 2021-12-19 LAB — CBC WITH DIFFERENTIAL/PLATELET
Abs Immature Granulocytes: 0.02 10*3/uL (ref 0.00–0.07)
Basophils Absolute: 0 10*3/uL (ref 0.0–0.1)
Basophils Relative: 1 %
Eosinophils Absolute: 0.6 10*3/uL — ABNORMAL HIGH (ref 0.0–0.5)
Eosinophils Relative: 7 %
HCT: 31.8 % — ABNORMAL LOW (ref 39.0–52.0)
Hemoglobin: 10.2 g/dL — ABNORMAL LOW (ref 13.0–17.0)
Immature Granulocytes: 0 %
Lymphocytes Relative: 28 %
Lymphs Abs: 2.5 10*3/uL (ref 0.7–4.0)
MCH: 29.9 pg (ref 26.0–34.0)
MCHC: 32.1 g/dL (ref 30.0–36.0)
MCV: 93.3 fL (ref 80.0–100.0)
Monocytes Absolute: 0.7 10*3/uL (ref 0.1–1.0)
Monocytes Relative: 8 %
Neutro Abs: 4.9 10*3/uL (ref 1.7–7.7)
Neutrophils Relative %: 56 %
Platelets: 245 10*3/uL (ref 150–400)
RBC: 3.41 MIL/uL — ABNORMAL LOW (ref 4.22–5.81)
RDW: 16.8 % — ABNORMAL HIGH (ref 11.5–15.5)
WBC: 8.7 10*3/uL (ref 4.0–10.5)
nRBC: 0 % (ref 0.0–0.2)

## 2021-12-19 LAB — COMPREHENSIVE METABOLIC PANEL
ALT: 9 U/L (ref 0–44)
AST: 12 U/L — ABNORMAL LOW (ref 15–41)
Albumin: 2.7 g/dL — ABNORMAL LOW (ref 3.5–5.0)
Alkaline Phosphatase: 90 U/L (ref 38–126)
Anion gap: 14 (ref 5–15)
BUN: 66 mg/dL — ABNORMAL HIGH (ref 8–23)
CO2: 17 mmol/L — ABNORMAL LOW (ref 22–32)
Calcium: 8.9 mg/dL (ref 8.9–10.3)
Chloride: 108 mmol/L (ref 98–111)
Creatinine, Ser: 15.67 mg/dL — ABNORMAL HIGH (ref 0.61–1.24)
GFR, Estimated: 3 mL/min — ABNORMAL LOW (ref >60)
Glucose, Bld: 71 mg/dL (ref 70–99)
Potassium: 5.7 mmol/L — ABNORMAL HIGH (ref 3.5–5.1)
Sodium: 139 mmol/L (ref 135–145)
Total Bilirubin: 0.4 mg/dL (ref 0.3–1.2)
Total Protein: 6.1 g/dL — ABNORMAL LOW (ref 6.5–8.1)

## 2021-12-19 LAB — TYPE AND SCREEN
ABO/RH(D): O POS
Antibody Screen: NEGATIVE

## 2021-12-19 LAB — PROTIME-INR
INR: 1.1 (ref 0.8–1.2)
Prothrombin Time: 13.8 seconds (ref 11.4–15.2)

## 2021-12-19 MED ORDER — LABETALOL HCL 5 MG/ML IV SOLN
20.0000 mg | Freq: Once | INTRAVENOUS | Status: AC
Start: 2021-12-19 — End: 2021-12-19
  Administered 2021-12-19: 20 mg via INTRAVENOUS
  Filled 2021-12-19: qty 4

## 2021-12-19 MED ORDER — CLEVIDIPINE BUTYRATE 0.5 MG/ML IV EMUL: 0.0000 mg/h | INTRAVENOUS | Status: DC

## 2021-12-19 MED ORDER — ACETAMINOPHEN 325 MG PO TABS
650.0000 mg | ORAL_TABLET | Freq: Once | ORAL | Status: AC
Start: 2021-12-19 — End: 2021-12-19
  Administered 2021-12-19: 650 mg via ORAL
  Filled 2021-12-19: qty 2

## 2021-12-19 MED ORDER — SODIUM CHLORIDE 0.9 % IV SOLN: 10.0000 mL/h | Freq: Once | INTRAVENOUS | Status: DC

## 2021-12-19 MED ORDER — SODIUM ZIRCONIUM CYCLOSILICATE 10 G PO PACK
10.0000 g | PACK | Freq: Once | ORAL | Status: AC
  Administered 2021-12-19: 10 g via ORAL
  Filled 2021-12-19: qty 1

## 2021-12-19 MED ORDER — LIDOCAINE 5 % EX PTCH
1.0000 | MEDICATED_PATCH | CUTANEOUS | Status: DC
  Administered 2021-12-19: 1 via TRANSDERMAL
  Filled 2021-12-19: qty 1

## 2021-12-19 NOTE — Progress Notes (Signed)
Patient ID: Richard [name], male   DOB: May 22, 1956, 66 y.o.   MRN: [id number] ?BP (!) 198/103   Pulse 80   Temp 98.5 ?F (36.9 ?C)   Resp 16   SpO2 99%  ?Head CT reviewed. By report there are no neurosurgical deficits.  ?The scan shows a miniscule high density signal in the right frontal lobe which could be subarachnoid blood. Given that he takes plavix I believe he should refrain from taking it for one week. He does not need follow up. I do not believe he needs a follow up head CT. ?

## 2021-12-19 NOTE — ED Triage Notes (Signed)
BIB [facility] from dialysis. Went in for dialysis, was told to come back tomorrow (did not get treatment), fell from wheelchair while leaving facility. Struck right temporal area, small abrasion. On plavix. Aox4 with some repetitive questioning with EMS. PEARRL 49mm brisk, grips equal. ? ?Hypertensive 203/107 with EMS  ?CBG 103  ?99% RA  ?84 HR ?

## 2021-12-19 NOTE — ED Provider Notes (Signed)
?[facility] ?Provider Note ? ? ?CSN: [id number] ?Arrival date & time: 12/19/21  1524 ? ?  ? ?History ? ?Chief Complaint  ?Patient presents with  ? Fall  ? ? ?Richard [name] is a 66 y.o. male. ? ?This is a 66 y.o. male  with significant medical history as below, including ESRD on HD TTHS, HIV, seizure, prior CVA w/ right sided residual who presents to the ED with complaint of fall. Pt was at HD today, fell from wheelchair, hit right side of his head on the ground, does not believe had had LOC. He is on plavix. Has some mild neck pain, right sided HA, left shoulder pain. Generally wheelchair bound.  Normal state of health prior to onset of symptoms.  No reported seizure activity by dialysis staff.  No fevers, chills, nausea, vomiting, numbness or tingling, no new vision or hearing changes.  No change in bowel or bladder function ? ?Dialysis access left upper extremity fistula; did NOT receive HD today, last session was Tuesday but only got partial session bc he felt nauseated.  ? ?C3 fracture last month, eval by Dr. Venetia Constable, recommend outpatient follow-up in 6 wks. Placed in collar.  ? ? ? ?Past Medical History: ?05/16/2020: COVID-19 ?No date: Diabetes mellitus, type II, insulin dependent ([place]) ?08/30/2021: ESRD on hemodialysis ([place]) ?No date: GERD (gastroesophageal reflux disease) ?No date: HIV (human immunodeficiency virus infection) ([place]) ?No date: Hypertension ?No date: Meningoencephalitis ?No date: Neuropathy in diabetes [facility]) ?    Comment:  bilat feet ?No date: Ruptured lumbar disc ?No date: Seizure disorder [facility]) ?2013: Stroke [facility]) ?    Comment:  residual right sided weakness (arm>leg) ? ?Past Surgical History: ?04/25/2020: AV FISTULA PLACEMENT; Left ?    Comment:  Procedure: LEFT ARM BRACHIOCEPHALIC ARTERIOVENOUS (AV)  ?             FISTULA CREATION;  Surgeon: Rosetta Posner, MD;  Location: ?             [facility] OR;  Service: Vascular;  Laterality: Left; ?04/13/2020: IR  FLUORO GUIDE CV LINE RIGHT ?01/14/2021: IR REMOVAL TUN CV CATH W/O FL ?04/13/2020: IR US GUIDE VASC ACCESS RIGHT ?No date: KNEE ARTHROSCOPY; Bilateral  ? ? ?The history is provided by the patient. No language interpreter was used.  ?Fall ?Associated symptoms include headaches. Pertinent negatives include no chest pain, no abdominal pain and no shortness of breath.  ? ?  ? ?Home Medications ?Prior to Admission medications   ?Medication Sig Start Date End Date Taking? Authorizing Provider  ?ACCU-CHEK AVIVA PLUS test strip USE 1 strip TO test blood sugar 2 TIMES DAILY TO 3 TIMES DAILY 06/10/19   [name], MD  ?acetaminophen (TYLENOL) 325 MG tablet Take 650 mg by mouth every 6 (six) hours as needed for moderate pain, fever or mild pain.    [provider]  ?blood glucose meter kit and supplies KIT Dispense based on patient and insurance preference. Check blood sugar 4 times a day 06/11/18   [name], MD  ?calcium acetate, Phos Binder, (PHOSLYRA) 667 MG/5ML SOLN Take 1,334 mg by mouth 3 (three) times daily with meals.    [provider]  ?carvedilol (COREG) 12.5 MG tablet Take 12.5 mg by mouth in the morning and at bedtime.    [provider]  ?cetirizine (ZYRTEC) 10 MG tablet Take 10 mg by mouth daily.    [provider]  ?Cholecalciferol (VITAMIN D) 50 MCG (2000 UT)  tablet Take 1 tablet (2,000 Units total) by mouth in the morning. 03/07/20   [name], MD  ?cloNIDine (CATAPRES) 0.1 MG tablet Take 1 tablet (0.1 mg total) by mouth 3 (three) times daily. 02/21/21   Hosie Poisson, MD  ?clopidogrel (PLAVIX) 75 MG tablet Take 1 tablet (75 mg total) by mouth daily. 03/07/20   [name], MD  ?Darbepoetin Alfa (ARANESP) 60 MCG/0.3ML SOSY injection Inject 0.3 mLs (60 mcg total) into the vein every Saturday with hemodialysis. 01/19/21   Bonnielee Haff, MD  ?docusate sodium (COLACE) 100 MG capsule Take 100 mg by mouth daily.    [provider]   ?dolutegravir-lamiVUDine (DOVATO) 50-300 MG tablet Take 1 tablet by mouth daily. 11/29/21   Truman Hayward, MD  ?dolutegravir-lamiVUDine (DOVATO) 50-300 MG tablet Take 1 tablet by mouth daily for 28 days. 11/29/21 12/27/21  Esmond Plants, RPH-CPP  ?doxepin (SINEQUAN) 75 MG capsule Take 75 mg by mouth at bedtime.    [provider]  ?epoetin alfa-epbx (RETACRIT) 72536 UNIT/ML injection Inject 20,000 Units into the skin every Monday.    [provider]  ?Ergocalciferol 50 MCG (2000 UT) TABS Take 2,000 Units by mouth daily.    [provider]  ?hydrALAZINE (APRESOLINE) 25 MG tablet Take 1 tablet (25 mg total) by mouth every 8 (eight) hours. 10/22/21   Hosie Poisson, MD  ?insulin glargine (LANTUS) 100 unit/mL SOPN Inject 5 Units into the skin at bedtime.    [provider]  ?Insulin Pen Needle (SURE COMFORT PEN NEEDLES) 31G X 8 MM MISC Use as directed twice daily with Novolog flex pen 02/10/19   Koberlein, [name], MD  ?levETIRAcetam (KEPPRA) 500 MG tablet Take 500 mg by mouth in the morning and at bedtime.    [provider]  ?Lidocaine 3 % CREA Apply 1 application topically See admin instructions. Apply 1 times a day on Tuesday, Thursday and Saturday prior to dialysis    [provider]  ?Misc. Devices (COMMODE 3-IN-1) MISC Use as directed 02/24/20   [name], MD  ?Misc. Devices (TRANSFER BENCH) MISC Use as directed 02/24/20   [name], MD  ?Multiple Vitamin (MULTIVITAMIN WITH MINERALS) TABS tablet Take 1 tablet by mouth at bedtime.    [provider]  ?nicotine (NICODERM CQ - DOSED IN MG/24 HOURS) 14 mg/24hr patch Place 1 patch (14 mg total) onto the skin daily as needed (smoking cessation). 10/22/21   Hosie Poisson, MD  ?ondansetron (ZOFRAN) 4 MG tablet Take by mouth. 12/09/21   [provider]  ?pantoprazole (PROTONIX) 40 MG tablet TAKE 1 TABLET BY MOUTH EVERY DAY ?Patient taking differently: 40 mg daily. 01/23/20   [name], MD  ?polyethylene glycol (MIRALAX / GLYCOLAX) 17 g packet Take 17 g by mouth daily as needed for severe constipation. 10/22/21   Hosie Poisson, MD  ?pravastatin (PRAVACHOL) 80 MG tablet Take 1 tablet (80 mg total) by mouth at bedtime. 03/07/20   [name], MD  ?rosuvastatin (CRESTOR) 5 MG tablet Take by mouth. 12/03/21   [provider]  ?sildenafil (VIAGRA) 100 MG tablet Take 100 mg by mouth daily as needed for erectile dysfunction.    [provider]  ?sulfamethoxazole-trimethoprim (BACTRIM DS) 800-160 MG tablet Take 1 tablet by mouth 3 (three) times a week. ?Patient taking differently: Take 1 tablet by mouth every Monday, Wednesday, and Friday. Once daily on M-W F continuous 04/23/20   Little Ishikawa, MD  ?tamsulosin (  FLOMAX) 0.4 MG CAPS capsule TAKE 1 CAPSULE BY MOUTH EVERY DAY ?Patient taking differently: Take 0.4 mg by mouth daily. 03/28/20   [name], MD  ?traMADol (ULTRAM) 50 MG tablet Take 50 mg by mouth every 8 (eight) hours as needed for moderate pain. 05/01/21   [provider]  ?triamcinolone cream (KENALOG) 0.1 % Apply 1 application topically 2 (two) times daily. To arms and legs    [provider]  ?TRUEPLUS INSULIN SYRINGE 31G X 5/16" 1 ML MISC Use pen needle with insulin 2 times daily 03/31/19   [name], MD  ?   ? ?Allergies    ?Patient has no known allergies.   ? ?Review of Systems   ?Review of Systems  ?Constitutional:  Negative for chills and fever.  ?HENT:  Negative for facial swelling and trouble swallowing.   ?Eyes:  Negative for photophobia and visual disturbance.  ?Respiratory:  Negative for cough and shortness of breath.   ?Cardiovascular:  Negative for chest pain and palpitations.  ?Gastrointestinal:  Negative for abdominal pain, nausea and vomiting.  ?Endocrine: Negative for polydipsia and polyuria.  ?Genitourinary:  Negative for difficulty urinating and hematuria.  ?Musculoskeletal:  Positive for arthralgias and  neck pain. Negative for gait problem and joint swelling.  ?Skin:  Negative for pallor and rash.  ?Neurological:  Positive for headaches. Negative for syncope.  ?Psychiatric/Behavioral:  Negative for agitation

## 2021-12-19 NOTE — ED Notes (Signed)
IV attempted by this RN. ?IV [place] attempted by second RN. ?IV [place] attempted by provider.  ?IV team at bedside now. ?

## 2021-12-19 NOTE — ED Notes (Signed)
MD made aware of pt's status - on plavix. ?

## 2021-12-19 NOTE — Discharge Instructions (Addendum)
Please do not take your Plavix for 1 week. ? ?Please follow up with normal dialysis schedule ? ?It was a pleasure caring for you today in the emergency department. ? ?Please return to the emergency department for any worsening or worrisome symptoms. ? ?

## 2021-12-20 LAB — LEVETIRACETAM LEVEL: Levetiracetam Lvl: 25.4 ug/mL (ref 10.0–40.0)

## 2022-01-01 ENCOUNTER — Telehealth: Payer: Self-pay | Admitting: Family Medicine

## 2022-01-01 NOTE — Telephone Encounter (Signed)
Pt will be d/c from [facility] today and per facility he needs post rehab fup in 3 days. [facility] will fax over d/c summary their phone number is [phone]. Please advise ?

## 2022-01-06 NOTE — Telephone Encounter (Signed)
Patient has primary through living facility that should be following him? He actually had dismissal letter sent out in January due to all of no shows? ?

## 2022-01-07 NOTE — Telephone Encounter (Signed)
Spoke with Altha Harm at [facility] and informed her of the message below.  She stated this will be forwarded to the patient's nurse. ?

## 2022-01-08 ENCOUNTER — Ambulatory Visit: Payer: Medicare HMO | Admitting: Family Medicine

## 2022-01-20 DIAGNOSIS — R451 Restlessness and agitation: Secondary | ICD-10-CM | POA: Diagnosis not present

## 2022-01-20 DIAGNOSIS — M7918 Myalgia, other site: Secondary | ICD-10-CM | POA: Diagnosis not present

## 2022-01-20 DIAGNOSIS — R569 Unspecified convulsions: Secondary | ICD-10-CM | POA: Diagnosis not present

## 2022-01-20 DIAGNOSIS — M5431 Sciatica, right side: Secondary | ICD-10-CM | POA: Diagnosis not present

## 2022-01-20 DIAGNOSIS — G4700 Insomnia, unspecified: Secondary | ICD-10-CM | POA: Diagnosis not present

## 2022-01-20 DIAGNOSIS — B20 Human immunodeficiency virus [HIV] disease: Secondary | ICD-10-CM | POA: Diagnosis not present

## 2022-01-20 DIAGNOSIS — K219 Gastro-esophageal reflux disease without esophagitis: Secondary | ICD-10-CM | POA: Diagnosis not present

## 2022-01-20 DIAGNOSIS — E119 Type 2 diabetes mellitus without complications: Secondary | ICD-10-CM | POA: Diagnosis not present

## 2022-01-20 DIAGNOSIS — N186 End stage renal disease: Secondary | ICD-10-CM | POA: Diagnosis not present

## 2022-01-21 DIAGNOSIS — N186 End stage renal disease: Secondary | ICD-10-CM | POA: Diagnosis not present

## 2022-01-21 DIAGNOSIS — N2581 Secondary hyperparathyroidism of renal origin: Secondary | ICD-10-CM | POA: Diagnosis not present

## 2022-01-21 DIAGNOSIS — Z992 Dependence on renal dialysis: Secondary | ICD-10-CM | POA: Diagnosis not present

## 2022-01-30 ENCOUNTER — Other Ambulatory Visit (HOSPITAL_COMMUNITY): Payer: Self-pay | Admitting: Physician Assistant

## 2022-01-30 ENCOUNTER — Other Ambulatory Visit (HOSPITAL_COMMUNITY): Payer: Self-pay | Admitting: Nephrology

## 2022-01-30 DIAGNOSIS — R451 Restlessness and agitation: Secondary | ICD-10-CM | POA: Diagnosis not present

## 2022-01-30 DIAGNOSIS — N186 End stage renal disease: Secondary | ICD-10-CM | POA: Diagnosis not present

## 2022-01-30 DIAGNOSIS — E785 Hyperlipidemia, unspecified: Secondary | ICD-10-CM | POA: Diagnosis not present

## 2022-01-30 DIAGNOSIS — M7918 Myalgia, other site: Secondary | ICD-10-CM | POA: Diagnosis not present

## 2022-01-30 DIAGNOSIS — E119 Type 2 diabetes mellitus without complications: Secondary | ICD-10-CM | POA: Diagnosis not present

## 2022-01-30 DIAGNOSIS — K219 Gastro-esophageal reflux disease without esophagitis: Secondary | ICD-10-CM | POA: Diagnosis not present

## 2022-01-30 DIAGNOSIS — G4700 Insomnia, unspecified: Secondary | ICD-10-CM | POA: Diagnosis not present

## 2022-01-30 DIAGNOSIS — M5431 Sciatica, right side: Secondary | ICD-10-CM | POA: Diagnosis not present

## 2022-01-30 DIAGNOSIS — B20 Human immunodeficiency virus [HIV] disease: Secondary | ICD-10-CM | POA: Diagnosis not present

## 2022-01-31 ENCOUNTER — Other Ambulatory Visit (HOSPITAL_COMMUNITY): Payer: Self-pay | Admitting: Nephrology

## 2022-01-31 ENCOUNTER — Ambulatory Visit (HOSPITAL_COMMUNITY)
Admission: RE | Admit: 2022-01-31 | Discharge: 2022-01-31 | Disposition: A | Discharge status: Home / Self Care | Payer: Medicare HMO | Source: Ambulatory Visit | Attending: Nephrology | Admitting: Nephrology

## 2022-01-31 ENCOUNTER — Encounter (HOSPITAL_COMMUNITY): Payer: Self-pay

## 2022-01-31 ENCOUNTER — Other Ambulatory Visit: Payer: Self-pay

## 2022-01-31 DIAGNOSIS — T82510A Breakdown (mechanical) of surgically created arteriovenous fistula, initial encounter: Secondary | ICD-10-CM | POA: Insufficient documentation

## 2022-01-31 DIAGNOSIS — Z992 Dependence on renal dialysis: Secondary | ICD-10-CM | POA: Diagnosis not present

## 2022-01-31 DIAGNOSIS — N186 End stage renal disease: Secondary | ICD-10-CM | POA: Diagnosis not present

## 2022-01-31 DIAGNOSIS — Y841 Kidney dialysis as the cause of abnormal reaction of the patient, or of later complication, without mention of misadventure at the time of the procedure: Secondary | ICD-10-CM | POA: Insufficient documentation

## 2022-01-31 DIAGNOSIS — Z4901 Encounter for fitting and adjustment of extracorporeal dialysis catheter: Secondary | ICD-10-CM | POA: Diagnosis not present

## 2022-01-31 DIAGNOSIS — F17210 Nicotine dependence, cigarettes, uncomplicated: Secondary | ICD-10-CM | POA: Insufficient documentation

## 2022-01-31 DIAGNOSIS — I82C21 Chronic embolism and thrombosis of right internal jugular vein: Secondary | ICD-10-CM | POA: Diagnosis not present

## 2022-01-31 DIAGNOSIS — E1122 Type 2 diabetes mellitus with diabetic chronic kidney disease: Secondary | ICD-10-CM | POA: Diagnosis not present

## 2022-01-31 DIAGNOSIS — I120 Hypertensive chronic kidney disease with stage 5 chronic kidney disease or end stage renal disease: Secondary | ICD-10-CM | POA: Insufficient documentation

## 2022-01-31 DIAGNOSIS — Z794 Long term (current) use of insulin: Secondary | ICD-10-CM | POA: Diagnosis not present

## 2022-01-31 HISTORY — PX: IR US GUIDE VASC ACCESS RIGHT: IMG2390

## 2022-01-31 HISTORY — PX: IR FLUORO GUIDE CV LINE RIGHT: IMG2283

## 2022-01-31 MED ORDER — HYDRALAZINE HCL 20 MG/ML IJ SOLN: 10.0000 mg | Freq: Once | INTRAMUSCULAR | Status: AC

## 2022-01-31 MED ORDER — HYDRALAZINE HCL 20 MG/ML IJ SOLN
10.0000 mg | Freq: Once | INTRAMUSCULAR | Status: AC
  Administered 2022-01-31: 10 mg via INTRAVENOUS

## 2022-01-31 MED ORDER — HYDRALAZINE HCL 20 MG/ML IJ SOLN
INTRAMUSCULAR | Status: AC
  Administered 2022-01-31: 10 mg via INTRAVENOUS
  Filled 2022-01-31: qty 1

## 2022-01-31 MED ORDER — MIDAZOLAM HCL 2 MG/2ML IJ SOLN
INTRAMUSCULAR | Status: AC
  Filled 2022-01-31: qty 2

## 2022-01-31 MED ORDER — HYDRALAZINE HCL 20 MG/ML IJ SOLN
INTRAMUSCULAR | Status: AC | PRN
  Administered 2022-01-31: 10 mg via INTRAVENOUS

## 2022-01-31 MED ORDER — HEPARIN SODIUM (PORCINE) 1000 UNIT/ML IJ SOLN
INTRAMUSCULAR | Status: AC
  Filled 2022-01-31: qty 10

## 2022-01-31 MED ORDER — CEFAZOLIN SODIUM-DEXTROSE 2-4 GM/100ML-% IV SOLN
INTRAVENOUS | Status: AC
  Filled 2022-01-31: qty 100

## 2022-01-31 MED ORDER — SODIUM CHLORIDE 0.9 % IV SOLN: INTRAVENOUS | Status: DC

## 2022-01-31 MED ORDER — MIDAZOLAM HCL 2 MG/2ML IJ SOLN
INTRAMUSCULAR | Status: AC | PRN
Start: 2022-01-31 — End: 2022-01-31
  Administered 2022-01-31: 1 mg via INTRAVENOUS
  Administered 2022-01-31: .5 mg via INTRAVENOUS

## 2022-01-31 MED ORDER — FENTANYL CITRATE (PF) 100 MCG/2ML IJ SOLN
INTRAMUSCULAR | Status: AC
  Filled 2022-01-31: qty 2

## 2022-01-31 MED ORDER — HYDRALAZINE HCL 20 MG/ML IJ SOLN
INTRAMUSCULAR | Status: AC
  Filled 2022-01-31: qty 1

## 2022-01-31 MED ORDER — CEFAZOLIN SODIUM-DEXTROSE 2-4 GM/100ML-% IV SOLN
2.0000 g | Freq: Once | INTRAVENOUS | Status: AC
  Administered 2022-01-31: 2 g via INTRAVENOUS

## 2022-01-31 MED ORDER — FENTANYL CITRATE (PF) 100 MCG/2ML IJ SOLN
INTRAMUSCULAR | Status: AC | PRN
  Administered 2022-01-31: 25 ug via INTRAVENOUS
  Administered 2022-01-31: 50 ug via INTRAVENOUS

## 2022-01-31 MED ORDER — LIDOCAINE-EPINEPHRINE 1 %-1:100000 IJ SOLN
INTRAMUSCULAR | Status: AC
Start: 2022-01-31 — End: 2022-01-31
  Administered 2022-01-31: 10 mL
  Filled 2022-01-31: qty 1

## 2022-01-31 NOTE — Sedation Documentation (Signed)
Per Dr. Laurence Ferrari, may send patient home with Diastolic reading below 69F ?

## 2022-01-31 NOTE — Progress Notes (Signed)
[place] x 4, call was lost x 3 and transferred to the wrong area x 1, pt was sent with DC instructions and a HD clamp, never reached nurse for instructions ?

## 2022-01-31 NOTE — Progress Notes (Signed)
[place], [place] notified of client's B/P and that IV team to start IV ?

## 2022-01-31 NOTE — Progress Notes (Signed)
[place], [place] notified of B/P and IV team unable to start IV ?

## 2022-01-31 NOTE — H&P (Signed)
? ?Chief Complaint: ?Patient was seen in consultation today for No chief complaint on file. ? at the request of Upton,[name] ? ?Referring Physician(s): ?Upton,[name] ? ?Supervising Physician: Jacqulynn Cadet ? ?Patient Status: [facility] - Out-pt ? ?History of Present Illness: ?Richard [name] is a 66 y.o. male with HTN, ESRD on dialysis from [facility].  He has a fistula that is malfunctioning and was unable to complete dialysis on Wednesday.  He is following up with vascular for fistulagram.  He presents today for placement of tunneled dialysis catheter. ?He reports having taken all morning medication but otherwise being NPO.  Denies CP, HA, SOB, Dizziness, Nausea.   ? ?Past Medical History:  ?Diagnosis Date  ? COVID-19 05/16/2020  ? Diabetes mellitus, type II, insulin dependent ([place])   ? ESRD on hemodialysis ([place]) 08/30/2021  ? GERD (gastroesophageal reflux disease)   ? HIV (human immunodeficiency virus infection) ([place])   ? Hypertension   ? Meningoencephalitis   ? Neuropathy in diabetes [facility])   ? bilat feet  ? Ruptured lumbar disc   ? Seizure disorder ([place])   ? Stroke [facility]) 2013  ? residual right sided weakness (arm>leg)  ? ? ?Past Surgical History:  ?Procedure Laterality Date  ? AV FISTULA PLACEMENT Left 04/25/2020  ? Procedure: LEFT ARM BRACHIOCEPHALIC ARTERIOVENOUS (AV) FISTULA CREATION;  Surgeon: Rosetta Posner, MD;  Location: [facility] OR;  Service: Vascular;  Laterality: Left;  ? IR FLUORO GUIDE CV LINE RIGHT  04/13/2020  ? IR REMOVAL TUN CV CATH W/O FL  01/14/2021  ? IR US GUIDE VASC ACCESS RIGHT  04/13/2020  ? KNEE ARTHROSCOPY Bilateral   ? ? ?Allergies: ?Patient has no known allergies. ? ?Medications: ?Prior to Admission medications   ?Medication Sig Start Date End Date Taking? Authorizing Provider  ?acetaminophen (TYLENOL) 325 MG tablet Take 650 mg by mouth every 6 (six) hours as needed for moderate pain, fever or mild pain.   Yes [provider]  ?calcium acetate, Phos Binder,  (PHOSLYRA) 667 MG/5ML SOLN Take 1,334 mg by mouth 3 (three) times daily with meals.   Yes [provider]  ?carvedilol (COREG) 12.5 MG tablet Take 12.5 mg by mouth in the morning and at bedtime.   Yes [provider]  ?cetirizine (ZYRTEC) 10 MG tablet Take 10 mg by mouth daily.   Yes [provider]  ?Cholecalciferol (VITAMIN D) 50 MCG (2000 UT) tablet Take 1 tablet (2,000 Units total) by mouth in the morning. 03/07/20  Yes Koberlein, [name], MD  ?cloNIDine (CATAPRES) 0.1 MG tablet Take 1 tablet (0.1 mg total) by mouth 3 (three) times daily. 02/21/21  Yes Hosie Poisson, MD  ?clopidogrel (PLAVIX) 75 MG tablet Take 1 tablet (75 mg total) by mouth daily. 03/07/20  Yes Koberlein, [name], MD  ?docusate sodium (COLACE) 100 MG capsule Take 100 mg by mouth daily.   Yes [provider]  ?dolutegravir-lamiVUDine (DOVATO) 50-300 MG tablet Take 1 tablet by mouth daily. 11/29/21  Yes Tommy Medal, Lavell Islam, MD  ?doxepin (SINEQUAN) 75 MG capsule Take 75 mg by mouth at bedtime.   Yes [provider]  ?epoetin alfa-epbx (RETACRIT) 16109 UNIT/ML injection Inject 20,000 Units into the skin every Monday.   Yes [provider]  ?Ergocalciferol 50 MCG (2000 UT) TABS Take 2,000 Units by mouth daily.   Yes [provider]  ?hydrALAZINE (APRESOLINE) 25 MG tablet Take 1 tablet (25 mg total) by mouth every 8 (eight) hours. 10/22/21  Yes Hosie Poisson, MD  ?  insulin glargine (LANTUS) 100 unit/mL SOPN Inject 5 Units into the skin at bedtime.   Yes [provider]  ?levETIRAcetam (KEPPRA) 500 MG tablet Take 500 mg by mouth in the morning and at bedtime.   Yes [provider]  ?Multiple Vitamin (MULTIVITAMIN WITH MINERALS) TABS tablet Take 1 tablet by mouth at bedtime.   Yes [provider]  ?ondansetron (ZOFRAN) 4 MG tablet Take by mouth. 12/09/21  Yes [provider]  ?pantoprazole (PROTONIX) 40 MG tablet TAKE 1 TABLET BY MOUTH EVERY DAY ?Patient  taking differently: 40 mg daily. 01/23/20  Yes Koberlein, [name], MD  ?polyethylene glycol (MIRALAX / GLYCOLAX) 17 g packet Take 17 g by mouth daily as needed for severe constipation. 10/22/21  Yes Hosie Poisson, MD  ?pravastatin (PRAVACHOL) 80 MG tablet Take 1 tablet (80 mg total) by mouth at bedtime. 03/07/20  Yes Koberlein, Junell C, MD  ?rosuvastatin (CRESTOR) 5 MG tablet Take by mouth. 12/03/21  Yes [provider]  ?sulfamethoxazole-trimethoprim (BACTRIM DS) 800-160 MG tablet Take 1 tablet by mouth 3 (three) times a week. ?Patient taking differently: Take 1 tablet by mouth every Monday, Wednesday, and Friday. Once daily on M-W F continuous 04/23/20  Yes Little Ishikawa, MD  ?tamsulosin (FLOMAX) 0.4 MG CAPS capsule TAKE 1 CAPSULE BY MOUTH EVERY DAY ?Patient taking differently: Take 0.4 mg by mouth daily. 03/28/20  Yes [name], MD  ?TRUEPLUS INSULIN SYRINGE 31G X 5/16" 1 ML MISC Use pen needle with insulin 2 times daily 03/31/19  Yes Koberlein, Junell C, MD  ?ACCU-CHEK AVIVA PLUS test strip USE 1 strip TO test blood sugar 2 TIMES DAILY TO 3 TIMES DAILY 06/10/19   [name], MD  ?blood glucose meter kit and supplies KIT Dispense based on patient and insurance preference. Check blood sugar 4 times a day 06/11/18   [name], MD  ?Darbepoetin Alfa (ARANESP) 60 MCG/0.3ML SOSY injection Inject 0.3 mLs (60 mcg total) into the vein every Saturday with hemodialysis. 01/19/21   Bonnielee Haff, MD  ?Insulin Pen Needle (SURE COMFORT PEN NEEDLES) 31G X 8 MM MISC Use as directed twice daily with Novolog flex pen 02/10/19   Koberlein, Andris Flurry C, MD  ?Lidocaine 3 % CREA Apply 1 application topically See admin instructions. Apply 1 times a day on Tuesday, Thursday and Saturday prior to dialysis    [provider]  ?Misc. Devices (COMMODE 3-IN-1) MISC Use as directed 02/24/20   [name], MD  ?Misc. Devices (TRANSFER BENCH) MISC Use as directed 02/24/20   Koberlein, [name], MD   ?nicotine (NICODERM CQ - DOSED IN MG/24 HOURS) 14 mg/24hr patch Place 1 patch (14 mg total) onto the skin daily as needed (smoking cessation). 10/22/21   Hosie Poisson, MD  ?sildenafil (VIAGRA) 100 MG tablet Take 100 mg by mouth daily as needed for erectile dysfunction.    [provider]  ?traMADol (ULTRAM) 50 MG tablet Take 50 mg by mouth every 8 (eight) hours as needed for moderate pain. 05/01/21   [provider]  ?triamcinolone cream (KENALOG) 0.1 % Apply 1 application topically 2 (two) times daily. To arms and legs    [provider]  ?  ? ?Family History  ?Problem Relation Age of Onset  ? Other Mother   ? Stroke Father   ? Hypertension Father   ? Epilepsy Father   ? Breast cancer Sister   ? Stroke Brother   ? Heart attack Brother   ?  Diabetes Sister   ? ? ?Social History  ? ?Socioeconomic History  ? Marital status: Single  ?  Spouse name: Not on file  ? Number of children: 2  ? Years of education: 41  ? Highest education level: 12th grade  ?Occupational History  ? Occupation: disabled  ?Tobacco Use  ? Smoking status: Every Day  ?  Packs/day: 0.50  ?  Years: 35.00  ?  Pack years: 17.50  ?  Types: Cigarettes  ?  Passive exposure: Current  ? Smokeless tobacco: Never  ? Tobacco comments:  ?  Currently smoking about 0.5ppd  ?Vaping Use  ? Vaping Use: Never used  ?Substance and Sexual Activity  ? Alcohol use: No  ? Drug use: Not Currently  ?  Types: Marijuana  ? Sexual activity: Not Currently  ?  Comment: declined condoms  ?Other Topics Concern  ? Not on file  ?Social History Narrative  ? Not on file  ? ?Social Determinants of Health  ? ?Financial Resource Strain: Low Risk   ? Difficulty of Paying Living Expenses: Not very hard  ?Food Insecurity: No Food Insecurity  ? Worried About Charity fundraiser in the Last Year: Never true  ? Ran Out of Food in the Last Year: Never true  ?Transportation Needs: No Transportation Needs  ? Lack of Transportation (Medical): No  ? Lack of Transportation  (Non-Medical): No  ?Physical Activity: Inactive  ? Days of Exercise per Week: 0 days  ? Minutes of Exercise per Session: 0 min  ?Stress: Stress Concern Present  ? Feeling of Stress : Very much  ?Social [employer]

## 2022-01-31 NOTE — Procedures (Signed)
Interventional Radiology Procedure Note ? ?Procedure: Placement of a 35 m Palindrome tunneled HD catheter via right subclavian vein.  ? ?Complications: None ? ?Estimated Blood Loss: None ? ?Recommendations: ?- Routine line care ? ? ? ?Signed, ? ?Criselda Peaches, MD ? ? ?

## 2022-02-03 DIAGNOSIS — E559 Vitamin D deficiency, unspecified: Secondary | ICD-10-CM | POA: Diagnosis not present

## 2022-02-03 DIAGNOSIS — I10 Essential (primary) hypertension: Secondary | ICD-10-CM | POA: Diagnosis not present

## 2022-02-03 DIAGNOSIS — N186 End stage renal disease: Secondary | ICD-10-CM | POA: Diagnosis not present

## 2022-02-03 DIAGNOSIS — I501 Left ventricular failure: Secondary | ICD-10-CM | POA: Diagnosis not present

## 2022-02-03 DIAGNOSIS — E785 Hyperlipidemia, unspecified: Secondary | ICD-10-CM | POA: Diagnosis not present

## 2022-02-03 DIAGNOSIS — I169 Hypertensive crisis, unspecified: Secondary | ICD-10-CM | POA: Diagnosis not present

## 2022-02-03 DIAGNOSIS — E119 Type 2 diabetes mellitus without complications: Secondary | ICD-10-CM | POA: Diagnosis not present

## 2022-02-03 DIAGNOSIS — I509 Heart failure, unspecified: Secondary | ICD-10-CM | POA: Diagnosis not present

## 2022-02-03 DIAGNOSIS — N400 Enlarged prostate without lower urinary tract symptoms: Secondary | ICD-10-CM | POA: Diagnosis not present

## 2022-02-04 DIAGNOSIS — N2581 Secondary hyperparathyroidism of renal origin: Secondary | ICD-10-CM | POA: Diagnosis not present

## 2022-02-04 DIAGNOSIS — R569 Unspecified convulsions: Secondary | ICD-10-CM | POA: Diagnosis not present

## 2022-02-04 DIAGNOSIS — R4781 Slurred speech: Secondary | ICD-10-CM | POA: Diagnosis not present

## 2022-02-04 DIAGNOSIS — Z992 Dependence on renal dialysis: Secondary | ICD-10-CM | POA: Diagnosis not present

## 2022-02-04 DIAGNOSIS — N186 End stage renal disease: Secondary | ICD-10-CM | POA: Diagnosis not present

## 2022-02-05 DIAGNOSIS — I10 Essential (primary) hypertension: Secondary | ICD-10-CM | POA: Diagnosis not present

## 2022-02-05 DIAGNOSIS — N186 End stage renal disease: Secondary | ICD-10-CM | POA: Diagnosis not present

## 2022-02-05 DIAGNOSIS — G8929 Other chronic pain: Secondary | ICD-10-CM | POA: Diagnosis not present

## 2022-02-05 DIAGNOSIS — Z992 Dependence on renal dialysis: Secondary | ICD-10-CM | POA: Diagnosis not present

## 2022-02-05 DIAGNOSIS — E119 Type 2 diabetes mellitus without complications: Secondary | ICD-10-CM | POA: Diagnosis not present

## 2022-02-05 DIAGNOSIS — B20 Human immunodeficiency virus [HIV] disease: Secondary | ICD-10-CM | POA: Diagnosis not present

## 2022-02-05 DIAGNOSIS — I5033 Acute on chronic diastolic (congestive) heart failure: Secondary | ICD-10-CM | POA: Diagnosis not present

## 2022-02-05 DIAGNOSIS — M25551 Pain in right hip: Secondary | ICD-10-CM | POA: Diagnosis not present

## 2022-02-05 DIAGNOSIS — G8191 Hemiplegia, unspecified affecting right dominant side: Secondary | ICD-10-CM | POA: Diagnosis not present

## 2022-02-05 DIAGNOSIS — K5909 Other constipation: Secondary | ICD-10-CM | POA: Diagnosis not present

## 2022-02-06 ENCOUNTER — Other Ambulatory Visit: Payer: Self-pay | Admitting: Family Medicine

## 2022-02-06 DIAGNOSIS — Z992 Dependence on renal dialysis: Secondary | ICD-10-CM | POA: Diagnosis not present

## 2022-02-06 DIAGNOSIS — N2581 Secondary hyperparathyroidism of renal origin: Secondary | ICD-10-CM | POA: Diagnosis not present

## 2022-02-06 DIAGNOSIS — N186 End stage renal disease: Secondary | ICD-10-CM | POA: Diagnosis not present

## 2022-02-07 ENCOUNTER — Other Ambulatory Visit: Payer: Self-pay | Admitting: Family Medicine

## 2022-02-11 DIAGNOSIS — N2581 Secondary hyperparathyroidism of renal origin: Secondary | ICD-10-CM | POA: Diagnosis not present

## 2022-02-11 DIAGNOSIS — N186 End stage renal disease: Secondary | ICD-10-CM | POA: Diagnosis not present

## 2022-02-11 DIAGNOSIS — Z992 Dependence on renal dialysis: Secondary | ICD-10-CM | POA: Diagnosis not present

## 2022-02-12 ENCOUNTER — Other Ambulatory Visit: Payer: Self-pay

## 2022-02-12 DIAGNOSIS — N186 End stage renal disease: Secondary | ICD-10-CM

## 2022-02-13 DIAGNOSIS — N186 End stage renal disease: Secondary | ICD-10-CM | POA: Diagnosis not present

## 2022-02-13 DIAGNOSIS — N2581 Secondary hyperparathyroidism of renal origin: Secondary | ICD-10-CM | POA: Diagnosis not present

## 2022-02-13 DIAGNOSIS — Z992 Dependence on renal dialysis: Secondary | ICD-10-CM | POA: Diagnosis not present

## 2022-02-18 DIAGNOSIS — N186 End stage renal disease: Secondary | ICD-10-CM | POA: Diagnosis not present

## 2022-02-18 DIAGNOSIS — Z992 Dependence on renal dialysis: Secondary | ICD-10-CM | POA: Diagnosis not present

## 2022-02-18 DIAGNOSIS — N2581 Secondary hyperparathyroidism of renal origin: Secondary | ICD-10-CM | POA: Diagnosis not present

## 2022-02-18 DIAGNOSIS — R451 Restlessness and agitation: Secondary | ICD-10-CM | POA: Diagnosis not present

## 2022-02-18 DIAGNOSIS — I10 Essential (primary) hypertension: Secondary | ICD-10-CM | POA: Diagnosis not present

## 2022-02-18 DIAGNOSIS — B20 Human immunodeficiency virus [HIV] disease: Secondary | ICD-10-CM | POA: Diagnosis not present

## 2022-02-19 DIAGNOSIS — N186 End stage renal disease: Secondary | ICD-10-CM | POA: Diagnosis not present

## 2022-02-19 DIAGNOSIS — E1122 Type 2 diabetes mellitus with diabetic chronic kidney disease: Secondary | ICD-10-CM | POA: Diagnosis not present

## 2022-02-19 DIAGNOSIS — Z992 Dependence on renal dialysis: Secondary | ICD-10-CM | POA: Diagnosis not present

## 2022-02-20 ENCOUNTER — Encounter (HOSPITAL_COMMUNITY): Payer: Self-pay

## 2022-02-20 ENCOUNTER — Other Ambulatory Visit: Payer: Self-pay | Admitting: Surgery

## 2022-02-20 DIAGNOSIS — N186 End stage renal disease: Secondary | ICD-10-CM | POA: Diagnosis not present

## 2022-02-20 DIAGNOSIS — Z992 Dependence on renal dialysis: Secondary | ICD-10-CM | POA: Diagnosis not present

## 2022-02-20 DIAGNOSIS — N2581 Secondary hyperparathyroidism of renal origin: Secondary | ICD-10-CM | POA: Diagnosis not present

## 2022-02-20 DIAGNOSIS — K5909 Other constipation: Secondary | ICD-10-CM

## 2022-02-24 ENCOUNTER — Other Ambulatory Visit: Payer: Self-pay

## 2022-02-24 ENCOUNTER — Encounter: Payer: Self-pay | Admitting: Surgery

## 2022-02-24 ENCOUNTER — Ambulatory Visit (HOSPITAL_COMMUNITY)
Admission: RE | Admit: 2022-02-24 | Discharge: 2022-02-24 | Disposition: A | Discharge status: Home / Self Care | Payer: Medicare HMO | Source: Ambulatory Visit | Attending: Surgery | Admitting: Surgery

## 2022-02-24 ENCOUNTER — Inpatient Hospital Stay (HOSPITAL_COMMUNITY): Admission: RE | Admit: 2022-02-24 | Payer: Medicare HMO | Source: Ambulatory Visit

## 2022-02-24 ENCOUNTER — Ambulatory Visit (INDEPENDENT_AMBULATORY_CARE_PROVIDER_SITE_OTHER): Payer: Medicare HMO | Admitting: Surgery

## 2022-02-24 VITALS — BP 207/92 | HR 70 | Temp 98.0°F | Resp 20 | Ht 72.0 in | Wt 192.0 lb

## 2022-02-24 DIAGNOSIS — N186 End stage renal disease: Secondary | ICD-10-CM | POA: Diagnosis not present

## 2022-02-24 DIAGNOSIS — Z992 Dependence on renal dialysis: Secondary | ICD-10-CM | POA: Diagnosis not present

## 2022-02-24 NOTE — Progress Notes (Unsigned)
Pre op letter given to pt.

## 2022-02-24 NOTE — Progress Notes (Signed)
[facility] [place]  Patient name: Richard [name] MRN: [id number] DOB: 04/02/1956 Sex: male   REASON FOR VISIT:    Dialysis access  HISOTRY OF PRESENT ILLNESS:    Richard [name] is a 66 y.o. male who returns for new dialysis access.  The patient has a history of a left brachiocephalic fistula by Dr. Donnetta Hutching on 04/25/2020.  His renal failure secondary to diabetes and hypertension.  He has a history of stroke with right-sided weakness.  He has a history of poor medical compliance.  He also has a seizure disorder and HIV.   PAST MEDICAL HISTORY:   Past Medical History:  Diagnosis Date   COVID-19 05/16/2020   Diabetes mellitus, type II, insulin dependent ([place])    ESRD on hemodialysis ([place]) 08/30/2021   GERD (gastroesophageal reflux disease)    HIV (human immunodeficiency virus infection) ([facility])    Hypertension    Meningoencephalitis    Neuropathy in diabetes ([place])    bilat feet   Ruptured lumbar disc    Seizure disorder ([place])    Stroke ([place]) 2013   residual right sided weakness (arm>leg)     FAMILY HISTORY:   Family History  Problem Relation Age of Onset   Other Mother    Stroke Father    Hypertension Father    Epilepsy Father    Breast cancer Sister    Stroke Brother    Heart attack Brother    Diabetes Sister     SOCIAL HISTORY:   Social History   Tobacco Use   Smoking status: Every Day    Packs/day: 0.50    Years: 35.00    Pack years: 17.50    Types: Cigarettes    Passive exposure: Current   Smokeless tobacco: Never   Tobacco comments:    Currently smoking about 0.5ppd  Substance Use Topics   Alcohol use: No     ALLERGIES:   No Known Allergies   CURRENT MEDICATIONS:   Current Outpatient Medications  Medication Sig Dispense Refill   ACCU-CHEK AVIVA PLUS test strip USE 1 strip TO test blood sugar 2 TIMES DAILY TO 3 TIMES DAILY 100 strip 1   acetaminophen (TYLENOL) 325 MG  tablet Take 650 mg by mouth every 6 (six) hours as needed for moderate pain, fever or mild pain.     blood glucose meter kit and supplies KIT Dispense based on patient and insurance preference. Check blood sugar 4 times a day 1 each 0   calcium acetate, Phos Binder, (PHOSLYRA) 667 MG/5ML SOLN Take 1,334 mg by mouth 3 (three) times daily with meals.     carvedilol (COREG) 12.5 MG tablet Take 12.5 mg by mouth in the morning and at bedtime.     cetirizine (ZYRTEC) 10 MG tablet Take 10 mg by mouth daily.     Cholecalciferol (VITAMIN D) 50 MCG (2000 UT) tablet Take 1 tablet (2,000 Units total) by mouth in the morning. 30 tablet 0   cloNIDine (CATAPRES) 0.1 MG tablet Take 1 tablet (0.1 mg total) by mouth 3 (three) times daily. 60 tablet 11   clopidogrel (PLAVIX) 75 MG tablet Take 1 tablet (75 mg total) by mouth daily. 90 tablet 0   Darbepoetin Alfa (ARANESP) 60 MCG/0.3ML SOSY injection Inject 0.3 mLs (60 mcg total) into the vein every Saturday with hemodialysis. 4.2 mL    docusate sodium (COLACE) 100 MG capsule Take 100 mg by mouth daily.     dolutegravir-lamiVUDine (DOVATO) 50-300 MG tablet Take  1 tablet by mouth daily. 30 tablet 11   doxepin (SINEQUAN) 75 MG capsule Take 75 mg by mouth at bedtime.     epoetin alfa-epbx (RETACRIT) 11914 UNIT/ML injection Inject 20,000 Units into the skin every Monday.     Ergocalciferol 50 MCG (2000 UT) TABS Take 2,000 Units by mouth daily.     hydrALAZINE (APRESOLINE) 25 MG tablet Take 1 tablet (25 mg total) by mouth every 8 (eight) hours. 90 tablet 3   insulin glargine (LANTUS) 100 unit/mL SOPN Inject 5 Units into the skin at bedtime.     Insulin Pen Needle (SURE COMFORT PEN NEEDLES) 31G X 8 MM MISC Use as directed twice daily with Novolog flex pen 100 each 3   levETIRAcetam (KEPPRA) 500 MG tablet Take 500 mg by mouth in the morning and at bedtime.     Lidocaine 3 % CREA Apply 1 application topically See admin instructions. Apply 1 times a day on Tuesday, Thursday and  Saturday prior to dialysis     Misc. Devices (COMMODE 3-IN-1) MISC Use as directed 1 each 0   Misc. Devices (TRANSFER BENCH) MISC Use as directed 1 each 0   Multiple Vitamin (MULTIVITAMIN WITH MINERALS) TABS tablet Take 1 tablet by mouth at bedtime.     nicotine (NICODERM CQ - DOSED IN MG/24 HOURS) 14 mg/24hr patch Place 1 patch (14 mg total) onto the skin daily as needed (smoking cessation). 28 patch 0   ondansetron (ZOFRAN) 4 MG tablet Take by mouth.     pantoprazole (PROTONIX) 40 MG tablet TAKE 1 TABLET BY MOUTH EVERY DAY (Patient taking differently: 40 mg daily.) 90 tablet 0   polyethylene glycol (MIRALAX / GLYCOLAX) 17 g packet Take 17 g by mouth daily as needed for severe constipation. 14 each 0   pravastatin (PRAVACHOL) 80 MG tablet Take 1 tablet (80 mg total) by mouth at bedtime. 90 tablet 0   rosuvastatin (CRESTOR) 5 MG tablet Take by mouth.     sildenafil (VIAGRA) 100 MG tablet Take 100 mg by mouth daily as needed for erectile dysfunction.     sulfamethoxazole-trimethoprim (BACTRIM DS) 800-160 MG tablet Take 1 tablet by mouth 3 (three) times a week. (Patient taking differently: Take 1 tablet by mouth every Monday, Wednesday, and Friday. Once daily on M-W F continuous) 30 tablet 0   tamsulosin (FLOMAX) 0.4 MG CAPS capsule TAKE 1 CAPSULE BY MOUTH EVERY DAY (Patient taking differently: Take 0.4 mg by mouth daily.) 30 capsule 0   traMADol (ULTRAM) 50 MG tablet Take 50 mg by mouth every 8 (eight) hours as needed for moderate pain.     triamcinolone cream (KENALOG) 0.1 % Apply 1 application topically 2 (two) times daily. To arms and legs     TRUEPLUS INSULIN SYRINGE 31G X 5/16" 1 ML MISC Use pen needle with insulin 2 times daily 100 each 1   No current facility-administered medications for this visit.    REVIEW OF SYSTEMS:   '[X]'  denotes positive finding, '[ ]'  denotes negative finding Cardiac  Comments:  Chest pain or chest pressure:    Shortness of breath upon exertion:    Short of  breath when lying flat:    Irregular heart rhythm:        Vascular    Pain in calf, thigh, or hip brought on by ambulation:    Pain in feet at night that wakes you up from your sleep:     Blood clot in your veins:    Leg swelling:  Pulmonary    Oxygen at home:    Productive cough:     Wheezing:         Neurologic    Sudden weakness in arms or legs:     Sudden numbness in arms or legs:     Sudden onset of difficulty speaking or slurred speech:    Temporary loss of vision in one eye:     Problems with dizziness:         Gastrointestinal    Blood in stool:     Vomited blood:         Genitourinary    Burning when urinating:     Blood in urine:        Psychiatric    Major depression:         Hematologic    Bleeding problems:    Problems with blood clotting too easily:        Skin    Rashes or ulcers:        Constitutional    Fever or chills:      PHYSICAL EXAM:   There were no vitals filed for this visit.  GENERAL: The patient is a well-nourished male, in no acute distress. The vital signs are documented above. CARDIAC: There is a regular rate and rhythm.  PULMONARY: Non-labored respirations MUSCULOSKELETAL: There are no major deformities or cyanosis. NEUROLOGIC: No focal weakness or paresthesias are detected. SKIN: There are no ulcers or rashes noted. PSYCHIATRIC: The patient has a normal affect.  STUDIES:   I have reviewed the following: +-----------------+-------------+----------+--------+  Right Cephalic   Diameter (cm)Depth (cm)Findings  +-----------------+-------------+----------+--------+  Prox upper arm       0.19                         +-----------------+-------------+----------+--------+  Mid upper arm        0.16                         +-----------------+-------------+----------+--------+  Dist upper arm       0.18                         +-----------------+-------------+----------+--------+  Antecubital fossa     0.21                         +-----------------+-------------+----------+--------+   +--------------+-------------+----------+--------+  Right Basilic Diameter (cm)Depth (cm)Findings  +--------------+-------------+----------+--------+  Prox upper arm    0.20                         +--------------+-------------+----------+--------+  Mid upper arm     0.33                         +--------------+-------------+----------+--------+  Dist upper arm    0.14                         +--------------+-------------+----------+--------+   +-----------------+-------------+----------+--------------+  Left Basilic     Diameter (cm)Depth (cm)   Findings     +-----------------+-------------+----------+--------------+  Mid upper arm                           not visualized  +-----------------+-------------+----------+--------------+  Dist upper arm  not visualized  +-----------------+-------------+----------+--------------+  Antecubital fossa                       not visualized  +-----------------+-------------+----------+--------------+   MEDICAL ISSUES:   End-stage renal disease: The patient is now dialyzing through a catheter on Tuesday Thursday Saturday.  He comes in today for new access.  He does not appear to have any good surface veins.  He has a chronic contraction from his stroke in the right arm.  I think his next option is either a left forearm or upper arm graft.  He will need to be off of his Plavix prior to the procedure which will be scheduled in the near future on a nondialysis day.    Leia Alf, MD, FACS [facility] [phone] Pager [phone]

## 2022-02-24 NOTE — Progress Notes (Signed)
Pt to hold Plavix from Tues-Friday per MD. Pt is scheduled for AVG later this week. He and his care taker are aware of the above. No questions/concerns at this time.

## 2022-02-24 NOTE — Progress Notes (Unsigned)
Pre op letter and instructions given to pt and his care taker.

## 2022-02-25 ENCOUNTER — Encounter (HOSPITAL_COMMUNITY): Payer: Self-pay | Admitting: Vascular Surgery

## 2022-02-25 ENCOUNTER — Encounter (HOSPITAL_COMMUNITY): Payer: Self-pay

## 2022-02-25 ENCOUNTER — Other Ambulatory Visit: Payer: Self-pay | Admitting: Surgery

## 2022-02-25 DIAGNOSIS — B20 Human immunodeficiency virus [HIV] disease: Secondary | ICD-10-CM

## 2022-02-25 DIAGNOSIS — N186 End stage renal disease: Secondary | ICD-10-CM

## 2022-02-25 DIAGNOSIS — K5909 Other constipation: Secondary | ICD-10-CM

## 2022-02-25 DIAGNOSIS — G8929 Other chronic pain: Secondary | ICD-10-CM

## 2022-02-25 DIAGNOSIS — G8191 Hemiplegia, unspecified affecting right dominant side: Secondary | ICD-10-CM

## 2022-02-25 NOTE — Pre-Procedure Instructions (Signed)
    Richard [name]  02/25/2022    Richard [name] procedure is scheduled on 02/28/22 at 7:30 AM.   Report to [facility] Admitting at 5:30 AM.   Call this number if you have problems the morning of surgery: [phone]   Remember:  Richard [name] is not to eat or drink after midnight Thursday, 02/27/22.                      Take these medicines the morning of surgery with A SIP OF WATER: Carvedilol (Coreg), Clonidine (Catapres), Dolutegravir-Lamivudine (Dovato), Hydralazine (Apresoline), Pantoprazole (Protonix), Rosuvastatin (Crestor), Tamsulosin (Flomax), Tramadol (Ultram) - prn, Ondansetron (Zofran) - prn, Acetaminophen (Tylenol) - prn.  Pt is hold Plavix 4 days prior to surgery per Dr. Ainsley Spinner office.  WHAT DO I DO ABOUT MY DIABETES MEDICATION?   THE NIGHT BEFORE SURGERY, take ____2_______ units of  _Glargine-yfgn_insulin.       Check Richard [name] blood sugar the morning of his surgery when he wakes up and every 2 hours until he leaves for the hospital. If his blood sugar is less than 70 mg/dL, you will need to treat for low blood sugar: Do not take insulin. Treat a low blood sugar (less than 70 mg/dL) with  cup of clear juice (cranberry or apple), 4 glucose tablets, OR glucose gel. Recheck blood sugar in 15 minutes after treatment (to make sure it is greater than 70 mg/dL). If your blood sugar is not greater than 70 mg/dL on recheck, call [phone] for further instructions. Report blood sugar to the short stay nurse when he arrives to Short Stay.  Per instructions from Dr. Rodman Comp, Anesthesiologist/Breonna Gafford Stan Head, RN    Do not wear jewelry.  Do not wear lotions, powders, cologne or deodorant.  Men may shave face and neck.  Do not bring valuables to the hospital.  [facility] is not responsible for any belongings or valuables.  Contacts, dentures or bridgework may not be worn into surgery.   Patients discharged the day of surgery will not be allowed  to drive home.   Please have patient shower with an antibacterial soap (Hibiclens or Dial) the night before surgery. Have a clean washcloth for him to put the soap on and scrub with all over his body. Have him rinse off and dry off with a clean towel. Make sure patient DOES NOT use any lotions, powders, cologne or deodorant after showering. Have him wear clean pajamas to bed and have clean sheets on his bed Thursday night. Have him wear clean, comfortable clothing day of surgery.   Have him brush his teeth morning of surgery.   Call me, Helene Kelp at [phone] if you have any questions when you get this today, until 7 PM. Tomorrow or Thursday call [phone] between the hours of 8 AM - 4 PM.

## 2022-02-25 NOTE — Progress Notes (Addendum)
Pt is a resident at [facility]. I spoke with Claudette Head, LPN for pre-op call. She states pt is alert and oriented. He tends to refuse his medications quite often. He is refusing to go to dialysis until he has surgery on his fistula. Last dialysis was Saturday, 02/22/22. Claudette Head states pt has not had any shortness of breath or fluid buildup.  She states he has allowed to check his blood sugar in the AM a few times. States it's running between 66- 104. Pt has hx of a stroke and is on Plavix. Dr. Ainsley Spinner office instructed him yesterday (02/24/22) to hold it as of 02/24/22.   I have faxed pre-op instructions to Tressa at [phone] and [phone].

## 2022-02-26 ENCOUNTER — Telehealth: Payer: Self-pay

## 2022-02-26 ENCOUNTER — Encounter (HOSPITAL_COMMUNITY): Payer: Self-pay | Admitting: Vascular Surgery

## 2022-02-26 NOTE — Telephone Encounter (Signed)
Received notification from Shelby Dubin., PA that patient has refused to go to dialysis until after his surgery on 02/28/22, which increases his risk for high potassium or high BP and volume overload. [place] and explained this to both the nurse Vee and patient on speaker phone. Patient verbalized that he would go to dialysis on tomorrow. This information was repeated back and both verbalized understanding.

## 2022-02-26 NOTE — Anesthesia Preprocedure Evaluation (Deleted)
Anesthesia Evaluation    Airway        Dental   Pulmonary Current Smoker,           Cardiovascular hypertension,      Neuro/Psych    GI/Hepatic   Endo/Other  diabetes  Renal/GU      Musculoskeletal   Abdominal   Peds  Hematology   Anesthesia Other Findings   Reproductive/Obstetrics                                                              Anesthesia Evaluation  Patient identified by MRN, date of birth, ID band Patient confused    Reviewed: Allergy & Precautions, NPO status , Patient's Chart, lab work & pertinent test results, Unable to perform ROS - Chart review only  Airway Mallampati: II  TM Distance: >3 FB Neck ROM: Full    Dental  (+) Missing, Dental Advisory Given, Poor Dentition   Pulmonary neg pulmonary ROS, Current Smoker,    Pulmonary exam normal breath sounds clear to auscultation       Cardiovascular hypertension, Pt. on home beta blockers and Pt. on medications Normal cardiovascular exam Rhythm:Regular Rate:Normal  Echo  1. Left ventricular ejection fraction, by estimation, is 55%. The left ventricle has normal function. The left ventricle has no regional wall motion abnormalities. There is moderate left ventricular hypertrophy. Left ventricular diastolic parameters are consistent with Grade I diastolic dysfunction (impaired relaxation).  2. Right ventricular systolic function is normal. The right ventricular size is normal. Tricuspid regurgitation signal is inadequate for assessing PA pressure.  3. Left atrial size was mildly dilated.  4. The mitral valve is normal in structure. No evidence of mitral valve regurgitation. No evidence of mitral stenosis.  5. The aortic valve is tricuspid. Aortic valve regurgitation is not visualized. No aortic stenosis is present.  6. The inferior vena cava is normal in size with greater than 50% respiratory  variability, suggesting right atrial pressure of 3 mmHg.    Neuro/Psych  Headaches, Seizures -,  CVA, Residual Symptoms negative psych ROS   GI/Hepatic Neg liver ROS, GERD  ,  Endo/Other  diabetes  Renal/GU Renal disease     Musculoskeletal  (+) Arthritis ,   Abdominal   Peds  Hematology  (+) HIV,   Anesthesia Other Findings   Reproductive/Obstetrics                            Anesthesia Physical Anesthesia Plan  ASA: IV  Anesthesia Plan: MAC   Post-op Pain Management:    Induction: Intravenous  PONV Risk Score and Plan: 1 and Ondansetron, Propofol infusion and Treatment may vary due to age or medical condition  Airway Management Planned: Natural Airway  Additional Equipment: None  Intra-op Plan:   Post-operative Plan:   Informed Consent: I have reviewed the patients History and Physical, chart, labs and discussed the procedure including the risks, benefits and alternatives for the proposed anesthesia with the patient or authorized representative who has indicated his/her understanding and acceptance.     Dental advisory given and Consent reviewed with POA  Plan Discussed with: CRNA  Anesthesia Plan Comments:       Anesthesia Quick Evaluation  Anesthesia Physical Anesthesia Plan  ASA:  Anesthesia Plan:    Post-op Pain Management:    Induction:   PONV Risk Score and Plan:   Airway Management Planned:   Additional Equipment:   Intra-op Plan:   Post-operative Plan:   Informed Consent:   Plan Discussed with:   Anesthesia Plan Comments: (See PAT note written 02/26/2022 by Myra Gianotti, PA-C. )        Anesthesia Quick Evaluation

## 2022-02-26 NOTE — Progress Notes (Signed)
Anesthesia Chart Review:  Case: [id number] Date/Time: 02/28/22 0715   Procedure: LEFT ARM INSERTION OF ARTERIOVENOUS (AV) GORE-TEX GRAFT ARM (Left)   Anesthesia type: Choice   Pre-op diagnosis: END STAGE RENAL DISEASE   Location: [facility] OR ROOM 11 / [place] OR   Surgeons: Marty Heck, MD       DISCUSSION: Patient is a 66 year old male scheduled for the above procedure. Prior left radiocephalic AVF created 12/21/72, but needs new permanent HD access. Currently dialyzing through a right Novice Palindrome TDC placed 01/31/22 by IR. HD TTS.   History includes smoking, HTN, DM2, neuropathy, HIV (diagnosed 04/2020), ESRD (started HD 04/2020, TTS), CVA with right hemiparesis (05/22/12 MRI: remote lacunar infarct of the left corona radiata; 08/2012 left posterior corona radiata infarct, 03/2020 left parietal lobe), GERD, meningoencephalitis (~ 2021), seizure disorder, medical non-compliance, recurrent falls (small right frontal SAH 12/19/21, fracture left transverse foramen C3 11/04/21)    He had a fall from wheelchair on 12/19/21 and was evaluated in the ED. CT head/c-spine showed likely small SAH right frontal lobe and subacute fracture left transverse foramen of C3 unchanged since since 11/04/21 [resulting from 11/04/21 fall, placed in c-collar]. Neurosurgeon Ashok Pall, MD consulted who wrote, "Head CT reviewed. By report there are no neurosurgical deficits.  The scan shows a miniscule high density signal in the right frontal lobe which could be subarachnoid blood. Given that he takes plavix I believe he should refrain from taking it for one week. He does not need follow up. I do not believe he needs a follow up head CT."  [place] admission 10/19/21-10/22/21 for for syncope with hypertensive crisis. He required Nitro gtt and oral carvedilol and clonidine for SBP > 200. Unclear etiology for syncope, but no concerning arrhythmias on telemetry. Head CT showed no acute intracranial findings, possible sinusitis. EEG was  normal. Echo showed worsening LVEF  45-50%, LVH, new inferior wall akinesis, grade I DD. Cardiologist Candee Furbish, MD consulted. Per 10/21/21 note, "Echo finding with EF 45 to 50% with inferior wall akinesis noted.  When previously reviewing other echocardiograms, I do believe that although not as clearly visualized, this finding was seen previously.   I would advocate for continued goal-directed medical therapy.  No invasive or ischemic evaluation.  He is not having any chest discomfort.  Statin therapy.  Continue with BP control which has been challenging.  On exam comfortable laying flat, denies any chest discomfort, regular rate and rhythm.   No other cardiology work-up or follow-up needed." Nephrology managed hemodialysis.    Last Plavix 02/24/22 per [facility]. By notes, he has medical non-compliance and refuses medications and/or dialysis at times. As of 02/25/22 PM, staff said he was refusing to go to HD until after 02/28/22 surgery and last session was on 02/22/22. No SOB or symptoms of volume overload at that time. Hopefully, he will change his mind, as missing several HD sessions places him at increased risk for developing hyperkalemia, volume overload and hypertensive urgency. I have communicated with [facility] RN Herma Ard. (UPDATE 02/26/22 11:58 AM: Herma Ard spoke with patient, and he is in agreement to go to hemodialysis on 02/27/22 as scheduled.)   VS: 02/20/22: BP 167/91, HR 85, RR 18 ([facility])   PROVIDERS: [name], MD (Inactive), dismissal letter sent in January due to recurrent no shows. [facility] is a resident at [facility]   ID Tommy Medal, Roderic Scarce, MD. Last visit 12/16/21.     LABS: For day of surgery. As  of 02/18/22 H/H 7.7/23.1 (up from 7.3/21.9 on 02/11/22) at [facility].    OTHER: EEG 10/19/21: IMPRESSION: This study is within normal limits. No seizures or epileptiform discharges were seen throughout the recording.   IMAGES: CT Head/C-spine  12/19/21: IMPRESSION: - Atrophy with small vessel chronic ischemic changes of deep cerebral white matter. - Small foci of acute subarachnoid hemorrhage in the RIGHT frontal region. - No acute cervical spine abnormalities. - Subacute fracture LEFT transverse foramen of C3 unchanged since 11/04/2021. - Findings called to Dr. Pearline Cables on 12/19/2021 at 1751 hrs.  1V CXR 12/19/21: FINDINGS: Cardiac silhouette and mediastinal contours are within normal limits. The lungs are clear. No pleural effusion or pneumothorax. Mild multilevel degenerative disc changes of the thoracic spine. Status post mild distal left clavicle excision.  IMPRESSION: No acute cardiopulmonary disease process.     EKG: 10/21/21:  Normal sinus rhythm Right atrial enlargement Minimal voltage criteria for LVH, may be normal variant ( Sokolow-Lyon ) Septal infarct , age undetermined Abnormal ECG When compared with ECG of 20-Oct-2021 07:43, PREVIOUS ECG IS PRESENT No significant change was found Confirmed by Jenkins Rouge [phone]) on 10/20/2021 11:00:56 PM   CV: Echo 10/20/21: IMPRESSIONS   1. Left ventricular ejection fraction, by estimation, is 45 to 50%. The  left ventricle has mildly decreased function. The left ventricle  demonstrates regional wall motion abnormalities (see scoring  diagram/findings for description: inferior wall is akinetic). There is moderate  left ventricular hypertrophy. Left ventricular diastolic parameters are  consistent with Grade I diastolic dysfunction (impaired relaxation).   2. Right ventricular systolic function is normal. The right ventricular  size is normal.   3. The mitral valve is normal in structure. No evidence of mitral valve  regurgitation. No evidence of mitral stenosis.   4. The aortic valve is tricuspid. There is mild calcification of the  aortic valve. Aortic valve regurgitation is not visualized. Aortic valve  sclerosis is present, with no evidence of aortic valve  stenosis.   5. The inferior vena cava is normal in size with greater than 50%  respiratory variability, suggesting right atrial pressure of 3 mmHg.  - Comparison(s): Prior images reviewed side by side.  - Comparison echo 02/17/21: LVEF 50-55%, no RWMA, moderate LVH, grade II DD, RVSF normal, severely dilated LA, moderately dilated RA, trivial MR, mild AV sclerosis without stenosis - Evaluated by cardiologist Candee Furbish, MD on 10/21/21, "Echo finding with EF 45 to 50% with inferior wall akinesis noted.  When previously reviewing other echocardiograms, I do believe that although not as clearly visualized, this finding was seen previously... I would advocate for continued goal-directed medical therapy.  No invasive or ischemic evaluation.  He is not having any chest discomfort.  Statin therapy.  Continue with BP control which has been challenging...  No other cardiology work-up or follow-up needed."    US Carotid 10/20/21: Summary:  Right Carotid: Velocities in the right ICA are consistent with a 1-39%  stenosis.  Left Carotid: Velocities in the left ICA are consistent with a 1-39%  stenosis.  Vertebrals: Bilateral vertebral arteries demonstrate high resistant flow.    Cardiac cath 06/23/11: CONCLUSIONS: 1. 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  I suspect that     the right coronary courses between the pulmonary artery and the     aorta. 2. Widely patent epicardial coronary arteries with the exception of a  small obtuse marginal #1 with a 70% ostial stenosis that should be     treated medically. 3. Normal left ventricular function with EF of 60%.   PLAN:  Per Dr. Fransico Him.  Myocardial perfusion imaging to determine if there is evidence of inferior wall ischemia due to the anomalous right coronary coursing between the pulmonary artery and aorta would be a consideration although I would certainly treat  this particular situation with medical therapy given the extraordinarily large size of the left coronary system in comparison to the right coronary and the relatively small vascular territory supplied by the right.  Past Medical History:  Diagnosis Date   COVID-19 05/16/2020   Diabetes mellitus, type II, insulin dependent ([place])    ESRD on hemodialysis ([place]) 08/30/2021   GERD (gastroesophageal reflux disease)    HIV (human immunodeficiency virus infection) ([place])    Hypertension    Meningoencephalitis    Neuropathy in diabetes ([place])    bilat feet   Recurrent falls    possible small SAH right frontal lobe 12/19/21 (Plavix held x 1 week); left transverse foramen C3 fracture 11/04/21   Ruptured lumbar disc    Seizure disorder ([place])    Stroke ([place]) 2013   residual right sided weakness (arm>leg)    Past Surgical History:  Procedure Laterality Date   AV FISTULA PLACEMENT Left 04/25/2020   Procedure: LEFT ARM BRACHIOCEPHALIC ARTERIOVENOUS (AV) FISTULA CREATION;  Surgeon: Rosetta Posner, MD;  Location: [facility] OR;  Service: Vascular;  Laterality: Left;   IR FLUORO GUIDE CV LINE RIGHT  04/13/2020   IR FLUORO GUIDE CV LINE RIGHT  01/31/2022   IR REMOVAL TUN CV CATH W/O FL  01/14/2021   IR US GUIDE VASC ACCESS RIGHT  04/13/2020   IR US GUIDE VASC ACCESS RIGHT  01/31/2022   IR US GUIDE VASC ACCESS RIGHT  01/31/2022   KNEE ARTHROSCOPY Bilateral     MEDICATIONS: No current facility-administered medications for this encounter.    acetaminophen (TYLENOL) 325 MG tablet   b complex-vitamin c-folic acid (NEPHRO-VITE) 0.8 MG TABS tablet   calcium acetate (PHOSLO) 667 MG capsule   carvedilol (COREG) 12.5 MG tablet   cloNIDine (CATAPRES) 0.1 MG tablet   clopidogrel (PLAVIX) 75 MG tablet   docusate sodium (COLACE) 100 MG capsule   dolutegravir-lamiVUDine (DOVATO) 50-300 MG tablet   doxepin (SINEQUAN) 75 MG capsule   epoetin alfa-epbx (RETACRIT) 93570 UNIT/ML injection   Ergocalciferol 50 MCG (2000 UT) TABS    hydrALAZINE (APRESOLINE) 10 MG tablet   insulin glargine-yfgn (SEMGLEE) 100 UNIT/ML Pen   levETIRAcetam (KEPPRA) 500 MG tablet   Lidocaine 3 % CREA   ondansetron (ZOFRAN) 4 MG tablet   pantoprazole (PROTONIX) 40 MG tablet   rosuvastatin (CRESTOR) 5 MG tablet   sulfamethoxazole-trimethoprim (BACTRIM DS) 800-160 MG tablet   tamsulosin (FLOMAX) 0.4 MG CAPS capsule   traMADol (ULTRAM) 50 MG tablet   ACCU-CHEK AVIVA PLUS test strip   blood glucose meter kit and supplies KIT   Darbepoetin Alfa (ARANESP) 60 MCG/0.3ML SOSY injection   Insulin Pen Needle (SURE COMFORT PEN NEEDLES) 31G X 8 MM MISC   Misc. Devices (COMMODE 3-IN-1) MISC   Misc. Devices (TRANSFER BENCH) MISC   TRUEPLUS INSULIN SYRINGE 31G X 5/16" 1 ML MISC    Myra Gianotti, PA-C Surgical Short Stay/Anesthesiology [facility] Phone [phone] [facility] Phone [phone] 02/26/2022 11:35 AM

## 2022-02-27 DIAGNOSIS — I10 Essential (primary) hypertension: Secondary | ICD-10-CM | POA: Diagnosis not present

## 2022-02-27 DIAGNOSIS — N2581 Secondary hyperparathyroidism of renal origin: Secondary | ICD-10-CM | POA: Diagnosis not present

## 2022-02-27 DIAGNOSIS — Z992 Dependence on renal dialysis: Secondary | ICD-10-CM | POA: Diagnosis not present

## 2022-02-27 DIAGNOSIS — R451 Restlessness and agitation: Secondary | ICD-10-CM | POA: Diagnosis not present

## 2022-02-27 DIAGNOSIS — B20 Human immunodeficiency virus [HIV] disease: Secondary | ICD-10-CM | POA: Diagnosis not present

## 2022-02-27 DIAGNOSIS — N186 End stage renal disease: Secondary | ICD-10-CM | POA: Diagnosis not present

## 2022-02-27 NOTE — Anesthesia Preprocedure Evaluation (Addendum)
Anesthesia Evaluation  Patient identified by MRN, date of birth, ID band Patient awake    Reviewed: Allergy & Precautions, NPO status , Patient's Chart, lab work & pertinent test results, Unable to perform ROS - Chart review only  History of Anesthesia Complications Negative for: history of anesthetic complications  Airway Mallampati: II  TM Distance: >3 FB Neck ROM: Full    Dental  (+) Poor Dentition, Missing, Dental Advisory Given   Pulmonary Current SmokerPatient did not abstain from smoking.,    Pulmonary exam normal        Cardiovascular hypertension, Pt. on home beta blockers and Pt. on medications +CHF  Normal cardiovascular exam  Cardiologist Candee Furbish, MD consulted. Per 10/21/21 note, "Echo finding with EF 45 to 50% with inferior wall akinesis noted. When previously reviewing other echocardiograms, I do believe that although not as clearly visualized, this finding was seen previously.      Neuro/Psych  Headaches, Seizures -,  PSYCHIATRIC DISORDERS Depression CVA, Residual Symptoms    GI/Hepatic Neg liver ROS, GERD  ,  Endo/Other  diabetes  Renal/GU Renal disease     Musculoskeletal  (+) Arthritis ,   Abdominal   Peds  Hematology  (+) HIV,   Anesthesia Other Findings   Reproductive/Obstetrics                            Anesthesia Physical  Anesthesia Plan  ASA: 4  Anesthesia Plan: General   Post-op Pain Management: Tylenol PO (pre-op)*   Induction: Intravenous  PONV Risk Score and Plan: 2 and Ondansetron, Propofol infusion and Treatment may vary due to age or medical condition  Airway Management Planned: LMA  Additional Equipment: None  Intra-op Plan:   Post-operative Plan: Extubation in OR  Informed Consent: I have reviewed the patients History and Physical, chart, labs and discussed the procedure including the risks, benefits and alternatives for the proposed  anesthesia with the patient or authorized representative who has indicated his/her understanding and acceptance.     Dental advisory given  Plan Discussed with: Anesthesiologist and CRNA  Anesthesia Plan Comments:        Anesthesia Quick Evaluation

## 2022-02-28 ENCOUNTER — Other Ambulatory Visit: Payer: Self-pay

## 2022-02-28 ENCOUNTER — Encounter (HOSPITAL_COMMUNITY): Payer: Self-pay | Admitting: Vascular Surgery

## 2022-02-28 ENCOUNTER — Ambulatory Visit (HOSPITAL_COMMUNITY)
Admission: RE | Admit: 2022-02-28 | Discharge: 2022-02-28 | Disposition: A | Discharge status: Home / Self Care | Payer: Medicare HMO | Attending: Vascular Surgery | Admitting: Vascular Surgery

## 2022-02-28 ENCOUNTER — Encounter (HOSPITAL_COMMUNITY)
Admission: RE | Disposition: A | Discharge status: Home / Self Care | Payer: Self-pay | Source: Home / Self Care | Attending: Vascular Surgery

## 2022-02-28 ENCOUNTER — Ambulatory Visit (HOSPITAL_BASED_OUTPATIENT_CLINIC_OR_DEPARTMENT_OTHER): Payer: Medicare HMO | Admitting: Vascular Surgery

## 2022-02-28 ENCOUNTER — Ambulatory Visit (HOSPITAL_COMMUNITY): Payer: Medicare HMO | Admitting: Vascular Surgery

## 2022-02-28 DIAGNOSIS — E1122 Type 2 diabetes mellitus with diabetic chronic kidney disease: Secondary | ICD-10-CM

## 2022-02-28 DIAGNOSIS — G40909 Epilepsy, unspecified, not intractable, without status epilepticus: Secondary | ICD-10-CM | POA: Insufficient documentation

## 2022-02-28 DIAGNOSIS — Z8673 Personal history of transient ischemic attack (TIA), and cerebral infarction without residual deficits: Secondary | ICD-10-CM | POA: Diagnosis not present

## 2022-02-28 DIAGNOSIS — N186 End stage renal disease: Secondary | ICD-10-CM | POA: Diagnosis not present

## 2022-02-28 DIAGNOSIS — I5030 Unspecified diastolic (congestive) heart failure: Secondary | ICD-10-CM | POA: Insufficient documentation

## 2022-02-28 DIAGNOSIS — I132 Hypertensive heart and chronic kidney disease with heart failure and with stage 5 chronic kidney disease, or end stage renal disease: Secondary | ICD-10-CM | POA: Diagnosis not present

## 2022-02-28 DIAGNOSIS — N185 Chronic kidney disease, stage 5: Secondary | ICD-10-CM

## 2022-02-28 DIAGNOSIS — Z992 Dependence on renal dialysis: Secondary | ICD-10-CM

## 2022-02-28 DIAGNOSIS — I509 Heart failure, unspecified: Secondary | ICD-10-CM | POA: Diagnosis not present

## 2022-02-28 DIAGNOSIS — Z21 Asymptomatic human immunodeficiency virus [HIV] infection status: Secondary | ICD-10-CM | POA: Insufficient documentation

## 2022-02-28 DIAGNOSIS — K219 Gastro-esophageal reflux disease without esophagitis: Secondary | ICD-10-CM | POA: Diagnosis not present

## 2022-02-28 HISTORY — DX: Repeated falls: R29.6

## 2022-02-28 HISTORY — PX: AV FISTULA PLACEMENT: SHX1204

## 2022-02-28 LAB — GLUCOSE, CAPILLARY
Glucose-Capillary: 67 mg/dL — ABNORMAL LOW (ref 70–99)
Glucose-Capillary: 172 mg/dL — ABNORMAL HIGH (ref 70–99)
Glucose-Capillary: 108 mg/dL — ABNORMAL HIGH (ref 70–99)

## 2022-02-28 LAB — POCT I-STAT, CHEM 8
BUN: 28 mg/dL — ABNORMAL HIGH (ref 8–23)
Calcium, Ion: 1.01 mmol/L — ABNORMAL LOW (ref 1.15–1.40)
Chloride: 102 mmol/L (ref 98–111)
Creatinine, Ser: 9.3 mg/dL — ABNORMAL HIGH (ref 0.61–1.24)
Glucose, Bld: 77 mg/dL (ref 70–99)
HCT: 30 % — ABNORMAL LOW (ref 39.0–52.0)
Hemoglobin: 10.2 g/dL — ABNORMAL LOW (ref 13.0–17.0)
Potassium: 4.2 mmol/L (ref 3.5–5.1)
Sodium: 138 mmol/L (ref 135–145)
TCO2: 29 mmol/L (ref 22–32)

## 2022-02-28 SURGERY — INSERTION OF ARTERIOVENOUS (AV) GORE-TEX GRAFT ARM
Anesthesia: General | Site: Arm Upper | Laterality: Left

## 2022-02-28 MED ORDER — SODIUM CHLORIDE 0.9 % IV SOLN: INTRAVENOUS | Status: DC

## 2022-02-28 MED ORDER — LABETALOL HCL 5 MG/ML IV SOLN
5.0000 mg | Freq: Once | INTRAVENOUS | Status: AC
  Administered 2022-02-28: 5 mg via INTRAVENOUS

## 2022-02-28 MED ORDER — HEPARIN SODIUM (PORCINE) 1000 UNIT/ML IJ SOLN
INTRAMUSCULAR | Status: DC | PRN
  Administered 2022-02-28: 5000 [IU] via INTRAVENOUS

## 2022-02-28 MED ORDER — CHLORHEXIDINE GLUCONATE 4 % EX LIQD: 60.0000 mL | Freq: Once | CUTANEOUS | Status: DC

## 2022-02-28 MED ORDER — CHLORHEXIDINE GLUCONATE 0.12 % MT SOLN: 15.0000 mL | Freq: Once | OROMUCOSAL | Status: AC

## 2022-02-28 MED ORDER — OXYCODONE-ACETAMINOPHEN 5-325 MG PO TABS: 1.0000 | ORAL_TABLET | Freq: Four times a day (QID) | ORAL | 0 refills | Status: DC | PRN

## 2022-02-28 MED ORDER — PROPOFOL 1000 MG/100ML IV EMUL
INTRAVENOUS | Status: AC
  Filled 2022-02-28: qty 100

## 2022-02-28 MED ORDER — CELECOXIB 200 MG PO CAPS
ORAL_CAPSULE | ORAL | Status: AC
  Administered 2022-02-28: 200 mg via ORAL
  Filled 2022-02-28: qty 1

## 2022-02-28 MED ORDER — LIDOCAINE HCL (PF) 1 % IJ SOLN
INTRAMUSCULAR | Status: AC
  Filled 2022-02-28: qty 30

## 2022-02-28 MED ORDER — HEPARIN 6000 UNIT IRRIGATION SOLUTION
Status: DC | PRN
  Administered 2022-02-28: 1

## 2022-02-28 MED ORDER — HEPARIN SODIUM (PORCINE) 1000 UNIT/ML IJ SOLN
1.9000 mL | Freq: Once | INTRAMUSCULAR | Status: AC
Start: 2022-02-28 — End: 2022-02-28
  Administered 2022-02-28: 1900 [IU]
  Filled 2022-02-28: qty 1.9

## 2022-02-28 MED ORDER — CEFAZOLIN SODIUM-DEXTROSE 2-4 GM/100ML-% IV SOLN
INTRAVENOUS | Status: AC
  Filled 2022-02-28: qty 100

## 2022-02-28 MED ORDER — FENTANYL CITRATE (PF) 250 MCG/5ML IJ SOLN
INTRAMUSCULAR | Status: DC | PRN
  Administered 2022-02-28: 100 ug via INTRAVENOUS

## 2022-02-28 MED ORDER — PROPOFOL 10 MG/ML IV BOLUS
INTRAVENOUS | Status: DC | PRN
  Administered 2022-02-28: 130 mg via INTRAVENOUS

## 2022-02-28 MED ORDER — LIDOCAINE 2% (20 MG/ML) 5 ML SYRINGE
INTRAMUSCULAR | Status: AC
  Filled 2022-02-28: qty 10

## 2022-02-28 MED ORDER — CHLORHEXIDINE GLUCONATE 0.12 % MT SOLN
OROMUCOSAL | Status: AC
  Administered 2022-02-28: 15 mL via OROMUCOSAL
  Filled 2022-02-28: qty 15

## 2022-02-28 MED ORDER — HEPARIN 6000 UNIT IRRIGATION SOLUTION
Status: AC
Start: 2022-02-28 — End: ?
  Filled 2022-02-28: qty 500

## 2022-02-28 MED ORDER — MIDAZOLAM HCL 2 MG/2ML IJ SOLN
INTRAMUSCULAR | Status: DC | PRN
  Administered 2022-02-28: 1 mg via INTRAVENOUS

## 2022-02-28 MED ORDER — HEPARIN SODIUM (PORCINE) 1000 UNIT/ML IJ SOLN
INTRAMUSCULAR | Status: AC
  Filled 2022-02-28: qty 10

## 2022-02-28 MED ORDER — DEXAMETHASONE SODIUM PHOSPHATE 10 MG/ML IJ SOLN
INTRAMUSCULAR | Status: DC | PRN
  Administered 2022-02-28: 10 mg via INTRAVENOUS

## 2022-02-28 MED ORDER — CELECOXIB 200 MG PO CAPS: 200.0000 mg | ORAL_CAPSULE | Freq: Once | ORAL | Status: AC

## 2022-02-28 MED ORDER — LABETALOL HCL 5 MG/ML IV SOLN
INTRAVENOUS | Status: AC
  Filled 2022-02-28: qty 4

## 2022-02-28 MED ORDER — FENTANYL CITRATE (PF) 100 MCG/2ML IJ SOLN: 25.0000 ug | INTRAMUSCULAR | Status: DC | PRN

## 2022-02-28 MED ORDER — PHENYLEPHRINE HCL-NACL 20-0.9 MG/250ML-% IV SOLN
INTRAVENOUS | Status: DC | PRN
  Administered 2022-02-28: 25 ug/min via INTRAVENOUS

## 2022-02-28 MED ORDER — DIPHENHYDRAMINE HCL 50 MG/ML IJ SOLN
INTRAMUSCULAR | Status: AC
  Filled 2022-02-28: qty 1

## 2022-02-28 MED ORDER — AMISULPRIDE (ANTIEMETIC) 5 MG/2ML IV SOLN: 10.0000 mg | Freq: Once | INTRAVENOUS | Status: DC | PRN

## 2022-02-28 MED ORDER — PROPOFOL 10 MG/ML IV BOLUS
INTRAVENOUS | Status: AC
  Filled 2022-02-28: qty 20

## 2022-02-28 MED ORDER — ORAL CARE MOUTH RINSE: 15.0000 mL | Freq: Once | OROMUCOSAL | Status: AC

## 2022-02-28 MED ORDER — CEFAZOLIN SODIUM-DEXTROSE 2-4 GM/100ML-% IV SOLN
2.0000 g | INTRAVENOUS | Status: AC
  Administered 2022-02-28: 2 g via INTRAVENOUS

## 2022-02-28 MED ORDER — 0.9 % SODIUM CHLORIDE (POUR BTL) OPTIME
TOPICAL | Status: DC | PRN
  Administered 2022-02-28: 1000 mL

## 2022-02-28 MED ORDER — LIDOCAINE 2% (20 MG/ML) 5 ML SYRINGE
INTRAMUSCULAR | Status: DC | PRN
  Administered 2022-02-28: 60 mg via INTRAVENOUS

## 2022-02-28 MED ORDER — DIPHENHYDRAMINE HCL 50 MG/ML IJ SOLN
INTRAMUSCULAR | Status: DC | PRN
  Administered 2022-02-28: 25 mg via INTRAVENOUS

## 2022-02-28 MED ORDER — ONDANSETRON HCL 4 MG/2ML IJ SOLN
INTRAMUSCULAR | Status: DC | PRN
  Administered 2022-02-28: 4 mg via INTRAVENOUS

## 2022-02-28 MED ORDER — ONDANSETRON HCL 4 MG/2ML IJ SOLN
INTRAMUSCULAR | Status: AC
  Filled 2022-02-28: qty 4

## 2022-02-28 MED ORDER — DEXTROSE 50 % IV SOLN
12.5000 g | INTRAVENOUS | Status: AC
  Administered 2022-02-28: 12.5 g via INTRAVENOUS

## 2022-02-28 MED ORDER — PROMETHAZINE HCL 25 MG/ML IJ SOLN: 6.2500 mg | INTRAMUSCULAR | Status: DC | PRN

## 2022-02-28 MED ORDER — FENTANYL CITRATE (PF) 250 MCG/5ML IJ SOLN
INTRAMUSCULAR | Status: AC
  Filled 2022-02-28: qty 5

## 2022-02-28 MED ORDER — DEXAMETHASONE SODIUM PHOSPHATE 10 MG/ML IJ SOLN
INTRAMUSCULAR | Status: AC
  Filled 2022-02-28: qty 2

## 2022-02-28 MED ORDER — MIDAZOLAM HCL 2 MG/2ML IJ SOLN
INTRAMUSCULAR | Status: AC
Start: 2022-02-28 — End: ?
  Filled 2022-02-28: qty 2

## 2022-02-28 MED ORDER — DEXTROSE 50 % IV SOLN
INTRAVENOUS | Status: AC
  Administered 2022-02-28: 50 mL
  Filled 2022-02-28: qty 50

## 2022-02-28 SURGICAL SUPPLY — 34 items
ADH SKN CLS APL DERMABOND .7 (GAUZE/BANDAGES/DRESSINGS) ×1
ARMBAND PINK RESTRICT EXTREMIT (MISCELLANEOUS) ×4 IMPLANT
BAG COUNTER SPONGE SURGICOUNT (BAG) ×2 IMPLANT
BAG SPNG CNTER NS LX DISP (BAG) ×1
BLADE CLIPPER SURG (BLADE) ×2 IMPLANT
CANISTER SUCT 3000ML PPV (MISCELLANEOUS) ×2 IMPLANT
CLIP VESOCCLUDE MED 6/CT (CLIP) ×2 IMPLANT
CLIP VESOCCLUDE SM WIDE 6/CT (CLIP) ×2 IMPLANT
COVER PROBE W GEL 5X96 (DRAPES) ×2 IMPLANT
DERMABOND ADVANCED (GAUZE/BANDAGES/DRESSINGS) ×1
DERMABOND ADVANCED .7 DNX12 (GAUZE/BANDAGES/DRESSINGS) ×1 IMPLANT
ELECT REM PT RETURN 9FT ADLT (ELECTROSURGICAL) ×2
ELECTRODE REM PT RTRN 9FT ADLT (ELECTROSURGICAL) ×1 IMPLANT
GLOVE BIO SURGEON STRL SZ7.5 (GLOVE) ×2 IMPLANT
GLOVE BIOGEL PI IND STRL 8 (GLOVE) ×1 IMPLANT
GLOVE BIOGEL PI INDICATOR 8 (GLOVE) ×1
GOWN STRL REUS W/ TWL LRG LVL3 (GOWN DISPOSABLE) ×2 IMPLANT
GOWN STRL REUS W/ TWL XL LVL3 (GOWN DISPOSABLE) ×2 IMPLANT
GOWN STRL REUS W/TWL LRG LVL3 (GOWN DISPOSABLE) ×4
GOWN STRL REUS W/TWL XL LVL3 (GOWN DISPOSABLE) ×4
GRAFT GORETEX STRT 4-7X45 (Vascular Products) ×1 IMPLANT
KIT BASIN OR (CUSTOM PROCEDURE TRAY) ×2 IMPLANT
KIT TURNOVER KIT B (KITS) ×2 IMPLANT
NS IRRIG 1000ML POUR BTL (IV SOLUTION) ×2 IMPLANT
PACK CV ACCESS (CUSTOM PROCEDURE TRAY) ×2 IMPLANT
PAD ARMBOARD 7.5X6 YLW CONV (MISCELLANEOUS) ×4 IMPLANT
SPIKE FLUID TRANSFER (MISCELLANEOUS) ×2 IMPLANT
SUT MNCRL AB 4-0 PS2 18 (SUTURE) ×3 IMPLANT
SUT PROLENE 6 0 BV (SUTURE) ×4 IMPLANT
SUT VIC AB 3-0 SH 27 (SUTURE) ×6
SUT VIC AB 3-0 SH 27X BRD (SUTURE) ×2 IMPLANT
TOWEL GREEN STERILE (TOWEL DISPOSABLE) ×2 IMPLANT
UNDERPAD 30X36 HEAVY ABSORB (UNDERPADS AND DIAPERS) ×2 IMPLANT
WATER STERILE IRR 1000ML POUR (IV SOLUTION) ×2 IMPLANT

## 2022-02-28 NOTE — Discharge Instructions (Addendum)
[facility]  Discharge Instructions  AV Fistula or Graft Surgery for Dialysis Access  Please refer to the following instructions for your post-procedure care. Your surgeon or physician assistant will discuss any changes with you.  Activity  You may drive the day following your surgery, if you are comfortable and no longer taking prescription pain medication. Resume full activity as the soreness in your incision resolves.  Bathing/Showering  You may shower after you go home. Keep your incision dry for 48 hours. Do not soak in a bathtub, hot tub, or swim until the incision heals completely. You may not shower if you have a hemodialysis catheter.  Incision Care  Clean your incision with mild soap and water after 48 hours. Pat the area dry with a clean towel. You do not need a bandage unless otherwise instructed. Do not apply any ointments or creams to your incision. You may have skin glue on your incision. Do not peel it off. It will come off on its own in about one week. Your arm may swell a bit after surgery. To reduce swelling use pillows to elevate your arm so it is above your heart. Your doctor will tell you if you need to lightly wrap your arm with an ACE bandage.  Diet  Resume your normal diet. There are not special food restrictions following this procedure. In order to heal from your surgery, it is CRITICAL to get adequate nutrition. Your body requires vitamins, minerals, and protein. Vegetables are the best source of vitamins and minerals. Vegetables also provide the perfect balance of protein. Processed food has little nutritional value, so try to avoid this.  Medications  Resume taking all of your medications. If your incision is causing pain, you may take over-the counter pain relievers such as acetaminophen (Tylenol). If you were prescribed a stronger pain medication, please be aware these medications can cause nausea and constipation. Prevent  nausea by taking the medication with a snack or meal. Avoid constipation by drinking plenty of fluids and eating foods with high amount of fiber, such as fruits, vegetables, and grains.  Do not take Tylenol if you are taking prescription pain medications.  Follow up Your surgeon may want to see you in the office following your access surgery. If so, this will be arranged at the time of your surgery.  Please call us immediately for any of the following conditions:  Increased pain, redness, drainage (pus) from your incision site Fever of 101 degrees or higher Severe or worsening pain at your incision site Hand pain or numbness.  Reduce your risk of vascular disease:  Stop smoking. If you would like help, call [facility] at 1-800-QUIT-NOW (581)587-8797) or [place] [facility] [place] your cholesterol Maintain a desired weight Control your diabetes Keep your blood pressure down  Dialysis  It will take several weeks to several months for your new dialysis access to be ready for use. Your surgeon will determine when it is okay to use it. Your nephrologist will continue to direct your dialysis. You can continue to use your Permcath until your new access is ready for use.   02/28/2022 Richard [name] [id number] 1956-08-04  Surgeon(s): Marty Heck, MD  Procedure(s): LEFT ARM INSERTION OF ARTERIOVENOUS (AV) GORE-TEX GRAFT ARM   May stick graft immediately   May stick graft on designated area only:   x Do not stick graft for 4 weeks    If you have any questions, please call the  office at [phone].

## 2022-02-28 NOTE — Anesthesia Postprocedure Evaluation (Signed)
Anesthesia Post Note  Patient: Richard [name]  Procedure(s) Performed: LEFT ARM INSERTION OF ARTERIOVENOUS (AV) GORE-TEX GRAFT ARM (Left: Arm Upper)     Patient location during evaluation: PACU Anesthesia Type: General Level of consciousness: sedated Pain management: pain level controlled Vital Signs Assessment: post-procedure vital signs reviewed and stable Respiratory status: spontaneous breathing and respiratory function stable Cardiovascular status: stable Postop Assessment: no apparent nausea or vomiting Anesthetic complications: no   No notable events documented.  Last Vitals:  Vitals:   02/28/22 0959 02/28/22 1011  BP: (!) 167/74 (!) 156/68  Pulse: 73 76  Resp: 11 12  Temp:    SpO2: 99% 98%    Last Pain:  Vitals:   02/28/22 1011  TempSrc:   PainSc: 0-No pain                 Ruhan Borak DANIEL

## 2022-02-28 NOTE — Progress Notes (Signed)
CBG 172 after patient received 12.5 g of Dextrose 50%

## 2022-02-28 NOTE — Op Note (Signed)
OPERATIVE NOTE   PROCEDURE: left upper arm arteriovenous graft (4 mm x 7 mm tapered Gore Tex graft)   PRE-OPERATIVE DIAGNOSIS: end stage renal disease   POST-OPERATIVE DIAGNOSIS: same  SURGEON: Marty Heck, MD  ASSISTANT(S): Vicente Serene, PA  ANESTHESIA: general  ESTIMATED BLOOD LOSS: <50 mL  FINDING(S): 4 mm end of the graft was sewn to the brachial artery and the 7 mm end of the graft was sewn to the larger brachial vein in the axilla.  Excellent thrill at completion.  Palpable radial pulse at the wrist.  SPECIMEN(S):  None  INDICATIONS:   Richard [name] is a 66 y.o. male who presents with end stage renal disease and need for permanent hemodialysis access.  Risk, benefits, and alternatives to access surgery were discussed.  The patient is aware the risks include but are not limited to: bleeding, infection, steal syndrome, nerve damage, ischemic monomelic neuropathy, failure to mature, and need for additional procedures.  The patient is aware of the risks and elects to proceed forward.  DESCRIPTION: After full informed written consent was obtained from the patient, the patient was brought back to the operating room and placed supine upon the operating table.  The patient was given IV antibiotics prior to proceeding.  After obtaining adequate sedation, the patient was prepped and draped in standard fashion for a left arm access procedure.  I evaluated all of the surface veins with ultrasound and they were small as indicated on pre-operative vein mapping.  I turned my attention first to the antecubitum.  Under ultrasound guidance, I identified the location of the brachial artery and marked it on the skin.  I then looked on the upper arm near the axilla and marked the brachial vein as well as axillary vein.  I made a longitudinal incision over the brachial artery above the antecubitum and another longitudinal incision over the brachial vein near the axillary vein.  I  dissected down through the subcutaneous tissue and  fascia carefully and was able to dissect out the brachial artery.  The artery was about 4.0 mm externally.  It was controlled proximally and distally with vessel loops.  I then dissected out the larger brachial vein through the upper arm incision.  Externally, it appeared to be 5 mm in diameter.  I then dissected this vein proximally and distal.  I took a metal Gore tunneler and dissected from the antecubital incision to the axillary incision.  Then I delivered the 4 x 7-mm stretch Gore-Tex graft, through this metal tunneler and then pulled out the metal tunneler leaving the graft in place.  The 4-mm end was left on the brachial artery side and the 7-mm end toward the vein side.  I then gave the patient 5,000 units of heparin to gain anticoagulation.  After waiting 2 minutes, I placed the brachial artery under tension proximally and distally with vessel loops, made an arteriotomy and extended it with a Potts scissor.  I sewed the 4-mm end of the graft to this arteriotomy with a running stitch of 6-0 Prolene.  At this point, then I completed the anastomosis in the usual fashion.  I released the vessel loops on the inflow and allowed the artery to decompress through the graft. There was good pulsatile bleeding through this graft.  I clamped the graft near its arterial anastomosis and sucked out all the blood in the graft and loaded the graft with heparinized saline.  At this point, I pulled the graft to  appropriate length and reset my exposure of the high brachial vein. The vein was controlled with Henle clamps and opened with 11 blade scalpel and extended with Potts scissors.  There was good venous backbleeding from the vein. I spatulated the graft to facilitate an end-to-side anastomosis.  In the process of spatulating, I cut the graft to appropriate length for this anastomosis.  This graft was sewn to the vein in an end-to-side configuration with a 6-0 Prolene.   Prior to completing this anastomosis, I allowed the vein to back bleed and then I also allowed the artery to bleed in an antegrade fashion.  I completed this anastomosis in the usual fashion.  I irrigated out both incisions.  The graft had a good palpable thrill.  The radial pulse was palpable.  The subcutaneous tissue in each incision was reapproximated with a running stitch of 3-0 Vicryl.  The skin was then reapproximated with a running subcuticular 4-0 Monocryl.  The skin was then cleaned, dried, and Dermabond used to reinforce the skin closure.   COMPLICATIONS: None  CONDITION: Stable   Marty Heck, MD [facility] Office: [place]    02/28/2022, 9:18 AM

## 2022-02-28 NOTE — Progress Notes (Signed)
Patient came this morning in short stay from [facility] accompanied by a caregiver. This Probation officer called the facility and ask about patient's medication. Facility staff was unable to provide information about when patient had last time his medication.  CBG 67 when patient arrived in short stay. Patient verbalized that he didn't have any medicine this morning, or Insulin. Dr. Tobias Alexander notified

## 2022-02-28 NOTE — Progress Notes (Signed)
Patient up to w/c awaiting transportation from facility Spoke with [facility] reports that driver is enroute

## 2022-02-28 NOTE — Progress Notes (Addendum)
Patient received Dextrose 50% - 12.5 g @ 06:50 o'clock per protocol (In the [facility] was documented by mistake an administration dose of 25 g at 06:54 o'clock; the only dose that patient received was 12.5 g) Benadryl 25mg  IV was administered for itching, per Dr. Tobias Alexander verbal order. Patient verbalized that medicine was effective.

## 2022-02-28 NOTE — Transfer of Care (Signed)
Immediate Anesthesia Transfer of Care Note  Patient: Richard [name]  Procedure(s) Performed: LEFT ARM INSERTION OF ARTERIOVENOUS (AV) GORE-TEX GRAFT ARM (Left: Arm Upper)  Patient Location: PACU  Anesthesia Type:General  Level of Consciousness: drowsy and patient cooperative  Airway & Oxygen Therapy: Patient Spontanous Breathing and Patient connected to face mask oxygen  Post-op Assessment: Report given to RN and Post -op Vital signs reviewed and stable  Post vital signs: Reviewed and stable  Last Vitals:  Vitals Value Taken Time  BP 168/69 02/28/22 0922  Temp    Pulse 78   Resp 15 02/28/22 0924  SpO2 100   Vitals shown include unvalidated device data.  Last Pain:  Vitals:   02/28/22 0621  TempSrc:   PainSc: 0-No pain         Complications: No notable events documented.

## 2022-02-28 NOTE — Anesthesia Procedure Notes (Signed)
Procedure Name: LMA Insertion Date/Time: 02/28/2022 7:41 AM  Performed by: Bryson Corona, CRNAPre-anesthesia Checklist: Patient identified, Emergency Drugs available, Suction available and Patient being monitored Patient Re-evaluated:Patient Re-evaluated prior to induction Oxygen Delivery Method: Circle System Utilized Preoxygenation: Pre-oxygenation with 100% oxygen Induction Type: IV induction Ventilation: Mask ventilation without difficulty LMA: LMA inserted LMA Size: 4.0 Number of attempts: 1 Airway Equipment and Method: Bite block Placement Confirmation: positive ETCO2 Tube secured with: Tape Dental Injury: Teeth and Oropharynx as per pre-operative assessment

## 2022-02-28 NOTE — H&P (Signed)
History and Physical Interval Note:  02/28/2022 7:28 AM  Richard [name]  has presented today for surgery, with the diagnosis of END STAGE RENAL DISEASE.  The various methods of treatment have been discussed with the patient and family. After consideration of risks, benefits and other options for treatment, the patient has consented to  Procedure(s): LEFT ARM INSERTION OF ARTERIOVENOUS (AV) GORE-TEX GRAFT ARM (Left) as a surgical intervention.  The patient's history has been reviewed, patient examined, no change in status, stable for surgery.  I have reviewed the patient's chart and labs.  Questions were answered to the patient's satisfaction.    Left arm AVF vs graft.  Likely graft.  Richard [name]  [facility]   Patient name: Richard [name]            MRN: [id number]        DOB: 04-Sep-1956          Sex: male     REASON FOR VISIT:      Dialysis access   HISOTRY OF PRESENT ILLNESS:      Richard [name] is a 66 y.o. male who returns for new dialysis access.  The patient has a history of a left brachiocephalic fistula by Dr. Donnetta Hutching on 04/25/2020.  His renal failure secondary to diabetes and hypertension.  He has a history of stroke with right-sided weakness.  He has a history of poor medical compliance.  He also has a seizure disorder and HIV.     PAST MEDICAL HISTORY:        Past Medical History:  Diagnosis Date   COVID-19 05/16/2020   Diabetes mellitus, type II, insulin dependent ([place])     ESRD on hemodialysis ([place]) 08/30/2021   GERD (gastroesophageal reflux disease)     HIV (human immunodeficiency virus infection) (H[facility])     Hypertension     Meningoencephalitis     Neuropathy in diabetes ([place])      bilat feet   Ruptured lumbar disc     Seizure disorder ([place])     Stroke ([place]) 2013    residual right sided weakness (arm>leg)        FAMILY HISTORY:         Family History  Problem Relation Age of Onset   Other Mother      Stroke Father     Hypertension Father     Epilepsy Father     Breast cancer Sister     Stroke Brother     Heart attack Brother     Diabetes Sister        SOCIAL HISTORY:    Social History         Tobacco Use   Smoking status: Every Day      Packs/day: 0.50      Years: 35.00      Pack years: 17.50      Types: Cigarettes      Passive exposure: Current   Smokeless tobacco: Never   Tobacco comments:      Currently smoking about 0.5ppd  Substance Use Topics   Alcohol use: No        ALLERGIES:    No Known Allergies     CURRENT MEDICATIONS:          Current Outpatient Medications  Medication Sig Dispense Refill   ACCU-CHEK AVIVA PLUS test strip USE 1 strip TO test blood sugar 2 TIMES DAILY TO 3 TIMES DAILY 100 strip 1  acetaminophen (TYLENOL) 325 MG tablet Take 650 mg by mouth every 6 (six) hours as needed for moderate pain, fever or mild pain.       blood glucose meter kit and supplies KIT Dispense based on patient and insurance preference. Check blood sugar 4 times a day 1 each 0   calcium acetate, Phos Binder, (PHOSLYRA) 667 MG/5ML SOLN Take 1,334 mg by mouth 3 (three) times daily with meals.       carvedilol (COREG) 12.5 MG tablet Take 12.5 mg by mouth in the morning and at bedtime.       cetirizine (ZYRTEC) 10 MG tablet Take 10 mg by mouth daily.       Cholecalciferol (VITAMIN D) 50 MCG (2000 UT) tablet Take 1 tablet (2,000 Units total) by mouth in the morning. 30 tablet 0   cloNIDine (CATAPRES) 0.1 MG tablet Take 1 tablet (0.1 mg total) by mouth 3 (three) times daily. 60 tablet 11   clopidogrel (PLAVIX) 75 MG tablet Take 1 tablet (75 mg total) by mouth daily. 90 tablet 0   Darbepoetin Alfa (ARANESP) 60 MCG/0.3ML SOSY injection Inject 0.3 mLs (60 mcg total) into the vein every Saturday with hemodialysis. 4.2 mL     docusate sodium (COLACE) 100 MG capsule Take 100 mg by mouth daily.       dolutegravir-lamiVUDine (DOVATO) 50-300 MG tablet Take 1 tablet by mouth  daily. 30 tablet 11   doxepin (SINEQUAN) 75 MG capsule Take 75 mg by mouth at bedtime.       epoetin alfa-epbx (RETACRIT) 47096 UNIT/ML injection Inject 20,000 Units into the skin every Monday.       Ergocalciferol 50 MCG (2000 UT) TABS Take 2,000 Units by mouth daily.       hydrALAZINE (APRESOLINE) 25 MG tablet Take 1 tablet (25 mg total) by mouth every 8 (eight) hours. 90 tablet 3   insulin glargine (LANTUS) 100 unit/mL SOPN Inject 5 Units into the skin at bedtime.       Insulin Pen Needle (SURE COMFORT PEN NEEDLES) 31G X 8 MM MISC Use as directed twice daily with Novolog flex pen 100 each 3   levETIRAcetam (KEPPRA) 500 MG tablet Take 500 mg by mouth in the morning and at bedtime.       Lidocaine 3 % CREA Apply 1 application topically See admin instructions. Apply 1 times a day on Tuesday, Thursday and Saturday prior to dialysis       Misc. Devices (COMMODE 3-IN-1) MISC Use as directed 1 each 0   Misc. Devices (TRANSFER BENCH) MISC Use as directed 1 each 0   Multiple Vitamin (MULTIVITAMIN WITH MINERALS) TABS tablet Take 1 tablet by mouth at bedtime.       nicotine (NICODERM CQ - DOSED IN MG/24 HOURS) 14 mg/24hr patch Place 1 patch (14 mg total) onto the skin daily as needed (smoking cessation). 28 patch 0   ondansetron (ZOFRAN) 4 MG tablet Take by mouth.       pantoprazole (PROTONIX) 40 MG tablet TAKE 1 TABLET BY MOUTH EVERY DAY (Patient taking differently: 40 mg daily.) 90 tablet 0   polyethylene glycol (MIRALAX / GLYCOLAX) 17 g packet Take 17 g by mouth daily as needed for severe constipation. 14 each 0   pravastatin (PRAVACHOL) 80 MG tablet Take 1 tablet (80 mg total) by mouth at bedtime. 90 tablet 0   rosuvastatin (CRESTOR) 5 MG tablet Take by mouth.       sildenafil (VIAGRA) 100 MG tablet Take 100 mg  by mouth daily as needed for erectile dysfunction.       sulfamethoxazole-trimethoprim (BACTRIM DS) 800-160 MG tablet Take 1 tablet by mouth 3 (three) times a week. (Patient taking differently:  Take 1 tablet by mouth every Monday, Wednesday, and Friday. Once daily on M-W F continuous) 30 tablet 0   tamsulosin (FLOMAX) 0.4 MG CAPS capsule TAKE 1 CAPSULE BY MOUTH EVERY DAY (Patient taking differently: Take 0.4 mg by mouth daily.) 30 capsule 0   traMADol (ULTRAM) 50 MG tablet Take 50 mg by mouth every 8 (eight) hours as needed for moderate pain.       triamcinolone cream (KENALOG) 0.1 % Apply 1 application topically 2 (two) times daily. To arms and legs       TRUEPLUS INSULIN SYRINGE 31G X 5/16" 1 ML MISC Use pen needle with insulin 2 times daily 100 each 1    No current facility-administered medications for this visit.      REVIEW OF SYSTEMS:    [X] denotes positive finding, [ ] denotes negative finding Cardiac   Comments:  Chest pain or chest pressure:      Shortness of breath upon exertion:      Short of breath when lying flat:      Irregular heart rhythm:             Vascular      Pain in calf, thigh, or hip brought on by ambulation:      Pain in feet at night that wakes you up from your sleep:       Blood clot in your veins:      Leg swelling:              Pulmonary      Oxygen at home:      Productive cough:       Wheezing:              Neurologic      Sudden weakness in arms or legs:       Sudden numbness in arms or legs:       Sudden onset of difficulty speaking or slurred speech:      Temporary loss of vision in one eye:       Problems with dizziness:              Gastrointestinal      Blood in stool:       Vomited blood:              Genitourinary      Burning when urinating:       Blood in urine:             Psychiatric      Major depression:              Hematologic      Bleeding problems:      Problems with blood clotting too easily:             Skin      Rashes or ulcers:             Constitutional      Fever or chills:          PHYSICAL EXAM:    There were no vitals filed for this visit.   GENERAL: The patient is a well-nourished  male, in no acute distress. The vital signs are documented above. CARDIAC: There is a regular rate and rhythm.  PULMONARY: Non-labored respirations MUSCULOSKELETAL:  There are no major deformities or cyanosis. NEUROLOGIC: No focal weakness or paresthesias are detected. SKIN: There are no ulcers or rashes noted. PSYCHIATRIC: The patient has a normal affect.   STUDIES:    I have reviewed the following: +-----------------+-------------+----------+--------+  Right Cephalic   Diameter (cm)Depth (cm)Findings  +-----------------+-------------+----------+--------+  Prox upper arm       0.19                         +-----------------+-------------+----------+--------+  Mid upper arm        0.16                         +-----------------+-------------+----------+--------+  Dist upper arm       0.18                         +-----------------+-------------+----------+--------+  Antecubital fossa    0.21                         +-----------------+-------------+----------+--------+   +--------------+-------------+----------+--------+  Right Basilic Diameter (cm)Depth (cm)Findings  +--------------+-------------+----------+--------+  Prox upper arm    0.20                         +--------------+-------------+----------+--------+  Mid upper arm     0.33                         +--------------+-------------+----------+--------+  Dist upper arm    0.14                         +--------------+-------------+----------+--------+   +-----------------+-------------+----------+--------------+  Left Basilic     Diameter (cm)Depth (cm)   Findings     +-----------------+-------------+----------+--------------+  Mid upper arm                           not visualized  +-----------------+-------------+----------+--------------+  Dist upper arm                          not visualized  +-----------------+-------------+----------+--------------+   Antecubital fossa                       not visualized  +-----------------+-------------+----------+--------------+    MEDICAL ISSUES:    End-stage renal disease: The patient is now dialyzing through a catheter on Tuesday Thursday Saturday.  He comes in today for new access.  He does not appear to have any good surface veins.  He has a chronic contraction from his stroke in the right arm.  I think his next option is either a left forearm or upper arm graft.  He will need to be off of his Plavix prior to the procedure which will be scheduled in the near future on a nondialysis day.       Leia Alf, MD, FACS [facility] [phone] Pager [phone]

## 2022-03-02 ENCOUNTER — Encounter (HOSPITAL_COMMUNITY): Payer: Self-pay | Admitting: Vascular Surgery

## 2022-03-04 DIAGNOSIS — N2581 Secondary hyperparathyroidism of renal origin: Secondary | ICD-10-CM | POA: Diagnosis not present

## 2022-03-04 DIAGNOSIS — Z992 Dependence on renal dialysis: Secondary | ICD-10-CM | POA: Diagnosis not present

## 2022-03-04 DIAGNOSIS — N186 End stage renal disease: Secondary | ICD-10-CM | POA: Diagnosis not present

## 2022-03-05 DIAGNOSIS — E119 Type 2 diabetes mellitus without complications: Secondary | ICD-10-CM | POA: Diagnosis not present

## 2022-03-05 DIAGNOSIS — E782 Mixed hyperlipidemia: Secondary | ICD-10-CM | POA: Diagnosis not present

## 2022-03-05 DIAGNOSIS — I10 Essential (primary) hypertension: Secondary | ICD-10-CM | POA: Diagnosis not present

## 2022-03-05 DIAGNOSIS — N186 End stage renal disease: Secondary | ICD-10-CM | POA: Diagnosis not present

## 2022-03-05 DIAGNOSIS — E559 Vitamin D deficiency, unspecified: Secondary | ICD-10-CM | POA: Diagnosis not present

## 2022-03-06 DIAGNOSIS — N2581 Secondary hyperparathyroidism of renal origin: Secondary | ICD-10-CM | POA: Diagnosis not present

## 2022-03-06 DIAGNOSIS — Z992 Dependence on renal dialysis: Secondary | ICD-10-CM | POA: Diagnosis not present

## 2022-03-06 DIAGNOSIS — N186 End stage renal disease: Secondary | ICD-10-CM | POA: Diagnosis not present

## 2022-03-10 DIAGNOSIS — E119 Type 2 diabetes mellitus without complications: Secondary | ICD-10-CM | POA: Diagnosis not present

## 2022-03-10 DIAGNOSIS — I10 Essential (primary) hypertension: Secondary | ICD-10-CM | POA: Diagnosis not present

## 2022-03-11 DIAGNOSIS — Z992 Dependence on renal dialysis: Secondary | ICD-10-CM | POA: Diagnosis not present

## 2022-03-11 DIAGNOSIS — N2581 Secondary hyperparathyroidism of renal origin: Secondary | ICD-10-CM | POA: Diagnosis not present

## 2022-03-11 DIAGNOSIS — N186 End stage renal disease: Secondary | ICD-10-CM | POA: Diagnosis not present

## 2022-03-13 ENCOUNTER — Other Ambulatory Visit: Payer: Self-pay | Admitting: *Deleted

## 2022-03-13 DIAGNOSIS — N2581 Secondary hyperparathyroidism of renal origin: Secondary | ICD-10-CM | POA: Diagnosis not present

## 2022-03-13 DIAGNOSIS — Z992 Dependence on renal dialysis: Secondary | ICD-10-CM | POA: Diagnosis not present

## 2022-03-13 DIAGNOSIS — N186 End stage renal disease: Secondary | ICD-10-CM | POA: Diagnosis not present

## 2022-03-13 MED ORDER — SILDENAFIL CITRATE 20 MG PO TABS: 20.0000 mg | ORAL_TABLET | Freq: Three times a day (TID) | ORAL | 0 refills | Status: DC | PRN

## 2022-03-13 NOTE — Telephone Encounter (Signed)
Patient is requesting a refill of Sildenafil 20 mg.  Not on current medication list.  Okay to refill?

## 2022-03-15 DIAGNOSIS — M7989 Other specified soft tissue disorders: Secondary | ICD-10-CM | POA: Diagnosis not present

## 2022-03-18 DIAGNOSIS — N186 End stage renal disease: Secondary | ICD-10-CM | POA: Diagnosis not present

## 2022-03-18 DIAGNOSIS — E785 Hyperlipidemia, unspecified: Secondary | ICD-10-CM | POA: Diagnosis not present

## 2022-03-18 DIAGNOSIS — I10 Essential (primary) hypertension: Secondary | ICD-10-CM | POA: Diagnosis not present

## 2022-03-18 DIAGNOSIS — N2581 Secondary hyperparathyroidism of renal origin: Secondary | ICD-10-CM | POA: Diagnosis not present

## 2022-03-18 DIAGNOSIS — Z992 Dependence on renal dialysis: Secondary | ICD-10-CM | POA: Diagnosis not present

## 2022-03-18 DIAGNOSIS — R569 Unspecified convulsions: Secondary | ICD-10-CM | POA: Diagnosis not present

## 2022-03-19 ENCOUNTER — Encounter: Payer: Self-pay | Admitting: Infectious Disease

## 2022-03-19 ENCOUNTER — Ambulatory Visit (INDEPENDENT_AMBULATORY_CARE_PROVIDER_SITE_OTHER): Payer: Medicare HMO | Admitting: Infectious Disease

## 2022-03-19 ENCOUNTER — Other Ambulatory Visit: Payer: Self-pay

## 2022-03-19 VITALS — BP 204/98 | HR 98 | Temp 98.5°F

## 2022-03-19 DIAGNOSIS — Z992 Dependence on renal dialysis: Secondary | ICD-10-CM | POA: Diagnosis not present

## 2022-03-19 DIAGNOSIS — I160 Hypertensive urgency: Secondary | ICD-10-CM

## 2022-03-19 DIAGNOSIS — B20 Human immunodeficiency virus [HIV] disease: Secondary | ICD-10-CM

## 2022-03-19 DIAGNOSIS — T148XXA Other injury of unspecified body region, initial encounter: Secondary | ICD-10-CM

## 2022-03-19 DIAGNOSIS — E785 Hyperlipidemia, unspecified: Secondary | ICD-10-CM | POA: Diagnosis not present

## 2022-03-19 DIAGNOSIS — N186 End stage renal disease: Secondary | ICD-10-CM | POA: Diagnosis not present

## 2022-03-19 DIAGNOSIS — I63032 Cerebral infarction due to thrombosis of left carotid artery: Secondary | ICD-10-CM | POA: Diagnosis not present

## 2022-03-19 DIAGNOSIS — I10 Essential (primary) hypertension: Secondary | ICD-10-CM | POA: Diagnosis not present

## 2022-03-19 DIAGNOSIS — I150 Renovascular hypertension: Secondary | ICD-10-CM | POA: Diagnosis not present

## 2022-03-19 HISTORY — DX: Other injury of unspecified body region, initial encounter: T14.8XXA

## 2022-03-19 MED ORDER — DOVATO 50-300 MG PO TABS
1.0000 | ORAL_TABLET | Freq: Every day | ORAL | 11 refills | Status: DC
Start: 2022-03-19 — End: 2022-09-26

## 2022-03-19 MED ORDER — CLONIDINE HCL 0.1 MG PO TABS
0.3000 mg | ORAL_TABLET | Freq: Every day | ORAL | Status: AC
  Administered 2022-03-19: 0.3 mg via ORAL

## 2022-03-19 MED ORDER — CLONIDINE HCL 0.1 MG PO TABS: 0.2000 mg | ORAL_TABLET | Freq: Every day | ORAL | Status: DC

## 2022-03-19 NOTE — Progress Notes (Signed)
Subjective:  Chief complaint   Patient ID: Richard [name], male    DOB: Oct 05, 1955, 66 y.o.   MRN: [id number]  HPI  Richard [name] is a now 66 year old black man -Birthday today!, living with multiple medical problems including poorly controlled diabetes hypertension end-stage renal disease on hemodialysis prior stroke who was admitted to the hospital with seizures and encephalopathy in 2021.  He was found have HIV and AIDS with a CD4 count of 39.  During that hospitalization we started dolutegravir and lamivudine.  Also started Bactrim for PCP prevention.  He was supposed to see me in September 2021 but has not been seen by [place] since then.  He now is residing in skilled [place] and he was on Kermit rather than Dovato.  Is not clear at all who made the switch in the antiviral regimen.  Regardless his viral load is suppressed and his CD4 count Actually reconstituting the he does not yet need criteria for having a CD4 above 200 for 6 months to stop his Bactrim for PCP prophylaxis.  He continued complain of buttocks pain on every single visit that I have seen him.  Do not see BIKTARVY on his med list at all and last time it was filled by the pharmacy that was delivering it was in 10-28-2022.  I was concerned he was not on antivirals and I gave him Dovato samples in person.  It now appears that Dovato is on his medical record at skilled facility.  We asked for his dialysis center to draw labs for him but they drew an HIV antibody rather than HIV RNA which is what we needed.  Since I last saw him unfortunately they were also not able to get a viral load done I believe due to something to do with electronic medical record or their ability to order HIV specific test.  He is recent been in the hospital for surgery with vascular surgery with placement of left arm AV graft.  He has been noticing a "knot" on the upper aspect of the incision.  It is not especially tender expect is  likely hematoma I have asked to make sure he shows this to vascular surgery when he sees them and he is scheduled to do so in roughly 2 weeks time.                Past Medical History:  Diagnosis Date   COVID-19 05/16/2020   Diabetes mellitus, type II, insulin dependent ([place])    ESRD on hemodialysis ([place]) 08/30/2021   GERD (gastroesophageal reflux disease)    HIV (human immunodeficiency virus infection) ([place])    Hypertension    Meningoencephalitis    Neuropathy in diabetes ([place])    bilat feet   Recurrent falls    possible small SAH right frontal lobe 12/19/21 (Plavix held x 1 week); left transverse foramen C3 fracture 11/04/21   Ruptured lumbar disc    Seizure disorder ([place])    Stroke ([place]) 2013   residual right sided weakness (arm>leg)    Past Surgical History:  Procedure Laterality Date   AV FISTULA PLACEMENT Left 04/25/2020   Procedure: LEFT ARM BRACHIOCEPHALIC ARTERIOVENOUS (AV) FISTULA CREATION;  Surgeon: Rosetta Posner, MD;  Location: [facility] OR;  Service: Vascular;  Laterality: Left;   AV FISTULA PLACEMENT Left 02/28/2022   Procedure: LEFT ARM INSERTION OF ARTERIOVENOUS (AV) GORE-TEX GRAFT ARM;  Surgeon: Marty Heck, MD;  Location: [facility];  Service: Vascular;  Laterality: Left;   IR  FLUORO GUIDE CV LINE RIGHT  04/13/2020   IR FLUORO GUIDE CV LINE RIGHT  01/31/2022   IR REMOVAL TUN CV CATH W/O FL  01/14/2021   IR US GUIDE VASC ACCESS RIGHT  04/13/2020   IR US GUIDE VASC ACCESS RIGHT  01/31/2022   IR US GUIDE VASC ACCESS RIGHT  01/31/2022   KNEE ARTHROSCOPY Bilateral     Family History  Problem Relation Age of Onset   Other Mother    Stroke Father    Hypertension Father    Epilepsy Father    Breast cancer Sister    Stroke Brother    Heart attack Brother    Diabetes Sister       Social History   Socioeconomic History   Marital status: Single    Spouse name: Not on file   Number of children: 2   Years of education: 12   Highest education level: 12th  grade  Occupational History   Occupation: disabled  Tobacco Use   Smoking status: Every Day    Packs/day: 0.50    Years: 35.00    Total pack years: 17.50    Types: Cigarettes    Passive exposure: Current   Smokeless tobacco: Never   Tobacco comments:    Currently smoking about 0.5ppd  Vaping Use   Vaping Use: Never used  Substance and Sexual Activity   Alcohol use: No   Drug use: Not Currently    Types: Marijuana   Sexual activity: Not Currently    Comment: declined condoms  Other Topics Concern   Not on file  Social History Narrative   Not on file   Social Determinants of Health   Financial Resource Strain: Low Risk  (07/17/2021)   Overall Financial Resource Strain (CARDIA)    Difficulty of Paying Living Expenses: Not very hard  Food Insecurity: No Food Insecurity (07/17/2021)   Hunger Vital Sign    Worried About Running Out of Food in the Last Year: Never true    Ran Out of Food in the Last Year: Never true  Transportation Needs: No Transportation Needs (07/17/2021)   PRAPARE - Hydrologist (Medical): No    Lack of Transportation (Non-Medical): No  Physical Activity: Inactive (07/17/2021)   Exercise Vital Sign    Days of Exercise per Week: 0 days    Minutes of Exercise per Session: 0 min  Stress: Stress Concern Present (07/17/2021)   [place]    Feeling of Stress : Very much  Social Connections: Socially Isolated (07/17/2021)   Social Connection and Isolation Panel [NHANES]    Frequency of Communication with Friends and Family: More than three times a week    Frequency of Social Gatherings with Friends and Family: More than three times a week    Attends Religious Services: Never    Marine scientist or Organizations: No    Attends Music therapist: Never    Marital Status: Divorced    No Known Allergies    Current Outpatient Medications:     ACCU-CHEK AVIVA PLUS test strip, USE 1 strip TO test blood sugar 2 TIMES DAILY TO 3 TIMES DAILY, Disp: 100 strip, Rfl: 1   acetaminophen (TYLENOL) 325 MG tablet, Take 650 mg by mouth every 6 (six) hours as needed for moderate pain, fever or mild pain., Disp: , Rfl:    b complex-vitamin c-folic acid (NEPHRO-VITE) 0.8 MG TABS tablet, Take 1 tablet  by mouth daily., Disp: , Rfl:    blood glucose meter kit and supplies KIT, Dispense based on patient and insurance preference. Check blood sugar 4 times a day, Disp: 1 each, Rfl: 0   calcium acetate (PHOSLO) 667 MG capsule, Take 2,001 mg by mouth 3 (three) times daily with meals., Disp: , Rfl:    carvedilol (COREG) 12.5 MG tablet, Take 12.5 mg by mouth 2 (two) times daily., Disp: , Rfl:    cloNIDine (CATAPRES) 0.1 MG tablet, Take 1 tablet (0.1 mg total) by mouth 3 (three) times daily., Disp: 60 tablet, Rfl: 11   clopidogrel (PLAVIX) 75 MG tablet, Take 1 tablet (75 mg total) by mouth daily., Disp: 90 tablet, Rfl: 0   Darbepoetin Alfa (ARANESP) 60 MCG/0.3ML SOSY injection, Inject 0.3 mLs (60 mcg total) into the vein every Saturday with hemodialysis., Disp: 4.2 mL, Rfl:    docusate sodium (COLACE) 100 MG capsule, Take 100 mg by mouth daily., Disp: , Rfl:    doxepin (SINEQUAN) 75 MG capsule, Take 75 mg by mouth at bedtime., Disp: , Rfl:    epoetin alfa-epbx (RETACRIT) 68088 UNIT/ML injection, Inject 20,000 Units into the skin every Monday., Disp: , Rfl:    Ergocalciferol 50 MCG (2000 UT) TABS, Take 2,000 Units by mouth daily., Disp: , Rfl:    hydrALAZINE (APRESOLINE) 10 MG tablet, Take 10 mg by mouth 3 (three) times daily., Disp: , Rfl:    insulin glargine-yfgn (SEMGLEE) 100 UNIT/ML Pen, Inject 5 Units into the skin at bedtime., Disp: , Rfl:    Insulin Pen Needle (SURE COMFORT PEN NEEDLES) 31G X 8 MM MISC, Use as directed twice daily with Novolog flex pen, Disp: 100 each, Rfl: 3   levETIRAcetam (KEPPRA) 500 MG tablet, Take 500 mg by mouth 2 (two) times daily.,  Disp: , Rfl:    Lidocaine 3 % CREA, Apply 1 application topically See admin instructions. Apply 1 times a day on Tuesday, Thursday and Saturday prior to dialysis, Disp: , Rfl:    Misc. Devices (COMMODE 3-IN-1) MISC, Use as directed, Disp: 1 each, Rfl: 0   Misc. Devices (TRANSFER BENCH) MISC, Use as directed, Disp: 1 each, Rfl: 0   ondansetron (ZOFRAN) 4 MG tablet, Take 4 mg by mouth every 8 (eight) hours as needed for vomiting or nausea., Disp: , Rfl:    oxyCODONE-acetaminophen (PERCOCET) 5-325 MG tablet, Take 1 tablet by mouth every 6 (six) hours as needed for severe pain., Disp: 12 tablet, Rfl: 0   pantoprazole (PROTONIX) 40 MG tablet, TAKE 1 TABLET BY MOUTH EVERY DAY, Disp: 90 tablet, Rfl: 0   rosuvastatin (CRESTOR) 5 MG tablet, Take 5 mg by mouth daily., Disp: , Rfl:    sildenafil (REVATIO) 20 MG tablet, Take 1 tablet (20 mg total) by mouth 3 (three) times daily as needed., Disp: 10 tablet, Rfl: 0   sulfamethoxazole-trimethoprim (BACTRIM DS) 800-160 MG tablet, Take 1 tablet by mouth 3 (three) times a week. (Patient taking differently: Take 1 tablet by mouth every Monday, Wednesday, and Friday. Once daily on M-W F continuous), Disp: 30 tablet, Rfl: 0   tamsulosin (FLOMAX) 0.4 MG CAPS capsule, TAKE 1 CAPSULE BY MOUTH EVERY DAY, Disp: 30 capsule, Rfl: 0   traMADol (ULTRAM) 50 MG tablet, Take 50 mg by mouth every 8 (eight) hours as needed for moderate pain., Disp: , Rfl:    TRUEPLUS INSULIN SYRINGE 31G X 5/16" 1 ML MISC, Use pen needle with insulin 2 times daily, Disp: 100 each, Rfl: 1  dolutegravir-lamiVUDine (DOVATO) 50-300 MG tablet, Take 1 tablet by mouth daily., Disp: 30 tablet, Rfl: 11    Review of Systems  Constitutional:  Negative for activity change, appetite change, chills, diaphoresis, fatigue, fever and unexpected weight change.  HENT:  Negative for congestion, rhinorrhea, sinus pressure, sneezing, sore throat and trouble swallowing.   Eyes:  Negative for photophobia and visual  disturbance.  Respiratory:  Negative for cough, chest tightness, shortness of breath, wheezing and stridor.   Cardiovascular:  Negative for chest pain, palpitations and leg swelling.  Gastrointestinal:  Negative for abdominal distention, abdominal pain, anal bleeding, blood in stool, constipation, diarrhea, nausea and vomiting.  Genitourinary:  Negative for difficulty urinating, dysuria, flank pain and hematuria.  Musculoskeletal:  Negative for arthralgias, back pain, gait problem, joint swelling and myalgias.  Skin:  Negative for color change, pallor, rash and wound.  Neurological:  Negative for dizziness, tremors, weakness and light-headedness.  Hematological:  Negative for adenopathy. Does not bruise/bleed easily.  Psychiatric/Behavioral:  Negative for agitation, behavioral problems, confusion, decreased concentration, dysphoric mood and sleep disturbance.        Objective:   Physical Exam Constitutional:      Appearance: He is well-developed.  HENT:     Head: Normocephalic and atraumatic.  Eyes:     Conjunctiva/sclera: Conjunctivae normal.  Cardiovascular:     Rate and Rhythm: Normal rate and regular rhythm.  Pulmonary:     Effort: Pulmonary effort is normal. No respiratory distress.     Breath sounds: No wheezing.  Abdominal:     General: There is no distension.     Palpations: Abdomen is soft.  Musculoskeletal:        General: No tenderness. Normal range of motion.     Cervical back: Normal range of motion and neck supple.  Skin:    General: Skin is warm and dry.     Coloration: Skin is not pale.     Findings: No erythema or rash.  Neurological:     Mental Status: He is alert and oriented to person, place, and time.  Psychiatric:        Mood and Affect: Mood normal.        Behavior: Behavior normal.        Thought Content: Thought content normal.        Judgment: Judgment normal.    Left upper extremity where he had recent vascular surgery March 19, 2022:          Assessment & Plan:   Postoperative hematoma: No purulence noted not especially tender:  We will forward my note to vascular surgery and they should be seeing him shortly.   HIV disease:  I am ordering HIV quantitative viral load which I am hoping we can draw on him today but if not I would like him to have it drawn the next time he is anywhere where the lab can be ordered and someone can access his dialysis catheter to obtain a viral load.  I am also ordering a CD4 count  STI screening I am checking a syphilis blood test if I phlebotomist can access his vein to obtain this   Hypertension: BP is completely out of control. He claims not have received medications at all H ehas no symptoms but this needs to be addressed by his facility.  We will recheck his BP and give him 0.2 mg if it is still this high  Today's Vitals   03/19/22 1346  BP: (!) 216/98  Pulse: 98  Temp: 98.5 F (36.9 C)  TempSrc: Oral  PainSc: 8   PainLoc: Generalized   There is no height or weight on file to calculate BMI.  End-stage renal disease on hemodialysis: He has had recent graft placed  Hematoma after surgery: Follow-up vascular surgery.  History of stroke he will continue on his statin antihypertensive medications and plavix

## 2022-03-19 NOTE — Addendum Note (Signed)
Addended by: Caffie Pinto on: 03/19/2022 02:23 PM   Modules accepted: Orders

## 2022-03-19 NOTE — Addendum Note (Signed)
Addended by: Daisy Floro T on: 03/19/2022 02:39 PM   Modules accepted: Orders

## 2022-03-20 DIAGNOSIS — N186 End stage renal disease: Secondary | ICD-10-CM | POA: Diagnosis not present

## 2022-03-20 DIAGNOSIS — N2581 Secondary hyperparathyroidism of renal origin: Secondary | ICD-10-CM | POA: Diagnosis not present

## 2022-03-20 DIAGNOSIS — Z992 Dependence on renal dialysis: Secondary | ICD-10-CM | POA: Diagnosis not present

## 2022-03-21 DIAGNOSIS — Z992 Dependence on renal dialysis: Secondary | ICD-10-CM | POA: Diagnosis not present

## 2022-03-21 DIAGNOSIS — N186 End stage renal disease: Secondary | ICD-10-CM | POA: Diagnosis not present

## 2022-03-21 DIAGNOSIS — E1122 Type 2 diabetes mellitus with diabetic chronic kidney disease: Secondary | ICD-10-CM | POA: Diagnosis not present

## 2022-03-26 ENCOUNTER — Telehealth: Payer: Self-pay

## 2022-03-26 NOTE — Telephone Encounter (Signed)
Brittney at the [facility] called stating that the pt had pain and swelling at his dialysis access, and they were barely able to dialyze d/t those symptoms.  Reviewed pt's chart, returned call, no answer.  Attempt to call pt, no answer.

## 2022-03-31 ENCOUNTER — Ambulatory Visit (INDEPENDENT_AMBULATORY_CARE_PROVIDER_SITE_OTHER): Payer: Medicare HMO | Admitting: Physician Assistant

## 2022-03-31 VITALS — BP 149/76 | HR 81 | Temp 98.1°F | Resp 20 | Ht 72.0 in

## 2022-03-31 DIAGNOSIS — Z992 Dependence on renal dialysis: Secondary | ICD-10-CM

## 2022-03-31 DIAGNOSIS — Z5900 Homelessness unspecified: Secondary | ICD-10-CM | POA: Insufficient documentation

## 2022-03-31 DIAGNOSIS — N186 End stage renal disease: Secondary | ICD-10-CM

## 2022-03-31 NOTE — Progress Notes (Signed)
    Postoperative Access Visit   History of Present Illness   Richard [name] is a 66 y.o. year old male who presents for postoperative follow-up for: left upper arm arteriovenous graft (Date: 02/28/22) by Dr. Carlis Abbott.  Patient denies steal symptoms of left hand.  His incisions have healed however he has noticed a knot under the axilla incision.  He denies any drainage.  He continues to dialyze via right IJ [facility] on a Tuesday Thursday Saturday schedule.  He currently resides in a nursing facility and is accompanied by a facility member.   Physical Examination   Vitals:   03/31/22 1500  BP: (!) 149/76  Pulse: 81  Resp: 20  Temp: 98.1 F (36.7 C)  TempSrc: Temporal  SpO2: 96%  Height: 6' (1.829 m)   Body mass index is 26.04 kg/m.  left arm Incisions are healed however there is a soft knot at the axilla incision; also some edema at the arterial anastomosis however not as well-defined by palpation; palpable radial pulse, hand grip is 5/5, sensation in digits is intact, palpable thrill, bruit can be auscultated        Medical Decision Making   Richard [name] is a 66 y.o. year old male who presents s/p left upper arm arteriovenous graft  Patent L arm AVG without signs or symptoms of steal syndrome There is a fluid collection at the venous anastomosis.  It is soft to touch without erythema or other suggestion of infection.  We will allow more time for this to resolve conservatively.  We will check a graft duplex in 2 to 3 weeks.  If swelling has improved and there is no suggestion of infection by duplex we will allow left arm AV graft to be used during dialysis at that time. In the meantime I recommended patient elevate his arm and exercise his left hand is much as possible during the day He will continue HD via right IJ [facility] for now   Richard Ligas PA-C [facility] [place] Office: [facility] MD: Richard [name]

## 2022-04-02 ENCOUNTER — Encounter (HOSPITAL_COMMUNITY): Payer: Self-pay

## 2022-04-07 ENCOUNTER — Other Ambulatory Visit: Payer: Self-pay

## 2022-04-07 DIAGNOSIS — N186 End stage renal disease: Secondary | ICD-10-CM

## 2022-04-28 NOTE — Progress Notes (Deleted)
POST OPERATIVE OFFICE NOTE    CC:  F/u for surgery  HPI:  This is a 66 y.o. male who is s/p LUA AVG on 02/28/2022 by Dr. Carlis Abbott.  He was seen on 03/31/2022 and at that time, he had noticed a knot under the axilla incision.  He was not having any drainage.   It was soft to touch without erythema or other signs of infection.  He was scheduled for a graft duplex in 2-3 weeks.  If the swelling improves and no suggestion of infection, we will allow the left arm AVG to be used.   He did not have evidence of steal symptoms.  He returns today for that visit.     He was residing in a nursing facility.  Pt states he does *** have pain/numbness in *** hand.    The pt *** on dialysis *** at *** location.  He has a right TDC that was placed by IR on 01/31/2022.   Allergies  Allergen Reactions   Oxycodone Nausea And Vomiting    Current Outpatient Medications  Medication Sig Dispense Refill   ACCU-CHEK AVIVA PLUS test strip USE 1 strip TO test blood sugar 2 TIMES DAILY TO 3 TIMES DAILY 100 strip 1   acetaminophen (TYLENOL) 325 MG tablet Take 650 mg by mouth every 6 (six) hours as needed for moderate pain, fever or mild pain.     b complex-vitamin c-folic acid (NEPHRO-VITE) 0.8 MG TABS tablet Take 1 tablet by mouth daily.     blood glucose meter kit and supplies KIT Dispense based on patient and insurance preference. Check blood sugar 4 times a day 1 each 0   calcium acetate (PHOSLO) 667 MG capsule Take 2,001 mg by mouth 3 (three) times daily with meals.     carvedilol (COREG) 12.5 MG tablet Take 12.5 mg by mouth 2 (two) times daily.     cloNIDine (CATAPRES) 0.1 MG tablet Take 1 tablet (0.1 mg total) by mouth 3 (three) times daily. 60 tablet 11   clopidogrel (PLAVIX) 75 MG tablet Take 1 tablet (75 mg total) by mouth daily. 90 tablet 0   Darbepoetin Alfa (ARANESP) 60 MCG/0.3ML SOSY injection Inject 0.3 mLs (60 mcg total) into the vein every Saturday with hemodialysis. 4.2 mL    docusate sodium (COLACE)  100 MG capsule Take 100 mg by mouth daily.     dolutegravir-lamiVUDine (DOVATO) 50-300 MG tablet Take 1 tablet by mouth daily. 30 tablet 11   doxepin (SINEQUAN) 75 MG capsule Take 75 mg by mouth at bedtime.     epoetin alfa-epbx (RETACRIT) 04888 UNIT/ML injection Inject 20,000 Units into the skin every Monday.     Ergocalciferol 50 MCG (2000 UT) TABS Take 2,000 Units by mouth daily.     hydrALAZINE (APRESOLINE) 10 MG tablet Take 10 mg by mouth 3 (three) times daily.     insulin glargine-yfgn (SEMGLEE) 100 UNIT/ML Pen Inject 5 Units into the skin at bedtime.     Insulin Pen Needle (SURE COMFORT PEN NEEDLES) 31G X 8 MM MISC Use as directed twice daily with Novolog flex pen 100 each 3   levETIRAcetam (KEPPRA) 500 MG tablet Take 500 mg by mouth 2 (two) times daily.     Lidocaine 3 % CREA Apply 1 application topically See admin instructions. Apply 1 times a day on Tuesday, Thursday and Saturday prior to dialysis     Misc. Devices (COMMODE 3-IN-1) MISC Use as directed 1 each 0   Misc. Devices (TRANSFER BENCH)  MISC Use as directed 1 each 0   ondansetron (ZOFRAN) 4 MG tablet Take 4 mg by mouth every 8 (eight) hours as needed for vomiting or nausea.     oxyCODONE-acetaminophen (PERCOCET) 5-325 MG tablet Take 1 tablet by mouth every 6 (six) hours as needed for severe pain. 12 tablet 0   pantoprazole (PROTONIX) 40 MG tablet TAKE 1 TABLET BY MOUTH EVERY DAY 90 tablet 0   rosuvastatin (CRESTOR) 5 MG tablet Take 5 mg by mouth daily.     sildenafil (REVATIO) 20 MG tablet Take 1 tablet (20 mg total) by mouth 3 (three) times daily as needed. 10 tablet 0   sulfamethoxazole-trimethoprim (BACTRIM DS) 800-160 MG tablet Take 1 tablet by mouth 3 (three) times a week. (Patient taking differently: Take 1 tablet by mouth every Monday, Wednesday, and Friday. Once daily on M-W F continuous) 30 tablet 0   tamsulosin (FLOMAX) 0.4 MG CAPS capsule TAKE 1 CAPSULE BY MOUTH EVERY DAY 30 capsule 0   traMADol (ULTRAM) 50 MG tablet  Take 50 mg by mouth every 8 (eight) hours as needed for moderate pain.     TRUEPLUS INSULIN SYRINGE 31G X 5/16" 1 ML MISC Use pen needle with insulin 2 times daily 100 each 1   No current facility-administered medications for this visit.     ROS:  See HPI  Physical Exam:  ***  Incision:  *** Extremities:   There *** a palpable *** pulse.   Motor and sensory *** in tact.   There *** a thrill/bruit present.  The fistula/graft *** easily palpable   Dialysis Duplex on 04/28/2022: Diameter:  *** Depth:  ***   Assessment/Plan:  This is a 66 y.o. male who is s/p: LUA AVG on 02/28/2022 by Dr. Carlis Abbott.   -the pt does *** have evidence of steal. -the fistula/graft can be used ***. -if pt has tunneled catheter, this can be removed at the discretion of the dialysis center once the fistula/graft has been accessed to their satisfaction.  -discussed with pt that access does not last forever and will need intervention or even new access at some point.  -the pt will follow up ***   Leontine Locket, [facility] [phone]  Clinic MD:  Donzetta Matters

## 2022-04-30 ENCOUNTER — Encounter (HOSPITAL_COMMUNITY): Payer: Medicare HMO

## 2022-05-12 ENCOUNTER — Emergency Department (HOSPITAL_COMMUNITY)
Admission: EM | Admit: 2022-05-12 | Discharge: 2022-05-12 | Discharge status: Against Medical Advice | Payer: Medicare HMO | Attending: Emergency Medicine | Admitting: Emergency Medicine

## 2022-05-12 ENCOUNTER — Encounter (HOSPITAL_COMMUNITY): Payer: Self-pay

## 2022-05-12 ENCOUNTER — Other Ambulatory Visit: Payer: Self-pay

## 2022-05-12 DIAGNOSIS — Z5321 Procedure and treatment not carried out due to patient leaving prior to being seen by health care provider: Secondary | ICD-10-CM | POA: Diagnosis not present

## 2022-05-12 DIAGNOSIS — Z91158 Patient's noncompliance with renal dialysis for other reason: Secondary | ICD-10-CM | POA: Insufficient documentation

## 2022-05-12 NOTE — ED Provider Triage Note (Signed)
Emergency Medicine Provider Triage Evaluation Note  Naeem Quillin , a 66 y.o. male  was evaluated in triage.  Pt brought in from SNF due to missed dialysis on Saturday (T/R/Sat).  Without any complaints, states the facility called 911 for him to come and be evaluated and dialyzed today.  Denies shortness of breath or chest pain.  States he did not go on Saturday, unable to specify the reason.  Review of Systems  Positive:  Negative: See above  Physical Exam  BP (!) 215/100   Pulse 92   Temp 98.1 F (36.7 C)   Resp 16   Ht 6' (1.829 m)   Wt 87 kg   SpO2 96%   BMI 26.01 kg/m  Gen:   Awake, no distress   Resp:  Normal effort  MSK:   Moves extremities with some difficulty at baseline, presents in wheelchair Other:  AV fistula of the left upper extremity.  Abdomen soft, nontender.  Medical Decision Making  Medically screening exam initiated at 11:13 AM.  Appropriate orders placed.  Trentan Trippe was informed that the remainder of the evaluation will be completed by another provider, this initial triage assessment does not replace that evaluation, and the importance of remaining in the ED until their evaluation is complete.  Patient refused labs   Prince [place], [place] 47/39/58 [id number]

## 2022-05-12 NOTE — ED Notes (Addendum)
Patient is refusing bloodwork. Reports he is just going to go home.

## 2022-05-12 NOTE — ED Notes (Signed)
Pt called 3x no answer  

## 2022-05-12 NOTE — ED Triage Notes (Signed)
Pt BIB from SNF for eval of missed dialysis. Pt has no complaints on arrival, facility called 911 today to have him come here and be dialyzed.

## 2022-05-14 ENCOUNTER — Encounter (HOSPITAL_COMMUNITY): Payer: Self-pay

## 2022-05-27 ENCOUNTER — Encounter (HOSPITAL_COMMUNITY): Payer: Self-pay

## 2022-06-09 ENCOUNTER — Ambulatory Visit: Payer: Medicare HMO | Admitting: Infectious Disease

## 2022-06-16 NOTE — Progress Notes (Unsigned)
HISTORY AND PHYSICAL     CC:  dialysis access Requesting Provider:  Marty Heck, MD  HPI: This is a 66 y.o. male here for evaluation of his hemodialysis access.  Pt has hx of left BC AVF by Dr. Donnetta Hutching in 2021.  He subsequently underwent LUA AVG on 02/28/2022 by Dr. Carlis Abbott.  He was seen in July for concerns of a knot under the axilla incision.  He was not having any drainage and his incisions had healed.    He has [place] that was placed in IR on 02/01/2022.  His renal failure secondary to diabetes and hypertension.  He has a history of stroke with right-sided weakness.  He has a history of poor medical compliance.  He also has a seizure disorder and HIV.  The pt is *** hand dominant.    Pt *** on dialysis.   Days of dialysis if applicable:  ***    HD center:  *** St location.   The pt is on a statin for cholesterol management.  The pt is not on a daily aspirin.  Other AC:  Plavix The pt is on BB, clonidine, hydralazine,  for hypertension.  The pt does have diabetic.   Tobacco hx:  current ***  Past Medical History:  Diagnosis Date   COVID-19 05/16/2020   Diabetes mellitus, type II, insulin dependent ([place])    ESRD on hemodialysis ([place]) 08/30/2021   GERD (gastroesophageal reflux disease)    Hematoma 03/19/2022   HIV (human immunodeficiency virus infection) ([place])    Hypertension    Meningoencephalitis    Neuropathy in diabetes ([place])    bilat feet   Recurrent falls    possible small SAH right frontal lobe 12/19/21 (Plavix held x 1 week); left transverse foramen C3 fracture 11/04/21   Ruptured lumbar disc    Seizure disorder ([place])    Stroke ([place]) 2013   residual right sided weakness (arm>leg)    Past Surgical History:  Procedure Laterality Date   AV FISTULA PLACEMENT Left 04/25/2020   Procedure: LEFT ARM BRACHIOCEPHALIC ARTERIOVENOUS (AV) FISTULA CREATION;  Surgeon: Rosetta Posner, MD;  Location: [facility] OR;  Service: Vascular;  Laterality: Left;   AV FISTULA PLACEMENT Left  02/28/2022   Procedure: LEFT ARM INSERTION OF ARTERIOVENOUS (AV) GORE-TEX GRAFT ARM;  Surgeon: Marty Heck, MD;  Location: [facility] OR;  Service: Vascular;  Laterality: Left;   IR FLUORO GUIDE CV LINE RIGHT  04/13/2020   IR FLUORO GUIDE CV LINE RIGHT  01/31/2022   IR REMOVAL TUN CV CATH W/O FL  01/14/2021   IR US GUIDE VASC ACCESS RIGHT  04/13/2020   IR US GUIDE VASC ACCESS RIGHT  01/31/2022   IR US GUIDE VASC ACCESS RIGHT  01/31/2022   KNEE ARTHROSCOPY Bilateral     Allergies  Allergen Reactions   Oxycodone Nausea And Vomiting    Current Outpatient Medications  Medication Sig Dispense Refill   ACCU-CHEK AVIVA PLUS test strip USE 1 strip TO test blood sugar 2 TIMES DAILY TO 3 TIMES DAILY 100 strip 1   acetaminophen (TYLENOL) 325 MG tablet Take 650 mg by mouth every 6 (six) hours as needed for moderate pain, fever or mild pain.     b complex-vitamin c-folic acid (NEPHRO-VITE) 0.8 MG TABS tablet Take 1 tablet by mouth daily.     blood glucose meter kit and supplies KIT Dispense based on patient and insurance preference. Check blood sugar 4 times a day 1 each 0   calcium acetate (  PHOSLO) 667 MG capsule Take 2,001 mg by mouth 3 (three) times daily with meals.     carvedilol (COREG) 12.5 MG tablet Take 12.5 mg by mouth 2 (two) times daily.     cloNIDine (CATAPRES) 0.1 MG tablet Take 1 tablet (0.1 mg total) by mouth 3 (three) times daily. 60 tablet 11   clopidogrel (PLAVIX) 75 MG tablet Take 1 tablet (75 mg total) by mouth daily. 90 tablet 0   Darbepoetin Alfa (ARANESP) 60 MCG/0.3ML SOSY injection Inject 0.3 mLs (60 mcg total) into the vein every Saturday with hemodialysis. 4.2 mL    docusate sodium (COLACE) 100 MG capsule Take 100 mg by mouth daily.     dolutegravir-lamiVUDine (DOVATO) 50-300 MG tablet Take 1 tablet by mouth daily. 30 tablet 11   doxepin (SINEQUAN) 75 MG capsule Take 75 mg by mouth at bedtime.     epoetin alfa-epbx (RETACRIT) 41287 UNIT/ML injection Inject 20,000 Units into the  skin every Monday.     Ergocalciferol 50 MCG (2000 UT) TABS Take 2,000 Units by mouth daily.     hydrALAZINE (APRESOLINE) 10 MG tablet Take 10 mg by mouth 3 (three) times daily.     insulin glargine-yfgn (SEMGLEE) 100 UNIT/ML Pen Inject 5 Units into the skin at bedtime.     Insulin Pen Needle (SURE COMFORT PEN NEEDLES) 31G X 8 MM MISC Use as directed twice daily with Novolog flex pen 100 each 3   levETIRAcetam (KEPPRA) 500 MG tablet Take 500 mg by mouth 2 (two) times daily.     Lidocaine 3 % CREA Apply 1 application topically See admin instructions. Apply 1 times a day on Tuesday, Thursday and Saturday prior to dialysis     Misc. Devices (COMMODE 3-IN-1) MISC Use as directed 1 each 0   Misc. Devices (TRANSFER BENCH) MISC Use as directed 1 each 0   ondansetron (ZOFRAN) 4 MG tablet Take 4 mg by mouth every 8 (eight) hours as needed for vomiting or nausea.     oxyCODONE-acetaminophen (PERCOCET) 5-325 MG tablet Take 1 tablet by mouth every 6 (six) hours as needed for severe pain. 12 tablet 0   pantoprazole (PROTONIX) 40 MG tablet TAKE 1 TABLET BY MOUTH EVERY DAY 90 tablet 0   rosuvastatin (CRESTOR) 5 MG tablet Take 5 mg by mouth daily.     sildenafil (REVATIO) 20 MG tablet Take 1 tablet (20 mg total) by mouth 3 (three) times daily as needed. 10 tablet 0   sulfamethoxazole-trimethoprim (BACTRIM DS) 800-160 MG tablet Take 1 tablet by mouth 3 (three) times a week. (Patient taking differently: Take 1 tablet by mouth every Monday, Wednesday, and Friday. Once daily on M-W F continuous) 30 tablet 0   tamsulosin (FLOMAX) 0.4 MG CAPS capsule TAKE 1 CAPSULE BY MOUTH EVERY DAY 30 capsule 0   traMADol (ULTRAM) 50 MG tablet Take 50 mg by mouth every 8 (eight) hours as needed for moderate pain.     TRUEPLUS INSULIN SYRINGE 31G X 5/16" 1 ML MISC Use pen needle with insulin 2 times daily 100 each 1   No current facility-administered medications for this visit.    Family History  Problem Relation Age of Onset    Other Mother    Stroke Father    Hypertension Father    Epilepsy Father    Breast cancer Sister    Stroke Brother    Heart attack Brother    Diabetes Sister     Social History   Socioeconomic History   Marital  status: Single    Spouse name: Not on file   Number of children: 2   Years of education: 93   Highest education level: 12th grade  Occupational History   Occupation: disabled  Tobacco Use   Smoking status: Every Day    Packs/day: 0.50    Years: 35.00    Total pack years: 17.50    Types: Cigarettes    Passive exposure: Current   Smokeless tobacco: Never   Tobacco comments:    Currently smoking about 0.5ppd  Vaping Use   Vaping Use: Never used  Substance and Sexual Activity   Alcohol use: No   Drug use: Not Currently    Types: Marijuana   Sexual activity: Not Currently    Comment: declined condoms  Other Topics Concern   Not on file  Social History Narrative   Not on file   Social Determinants of Health   Financial Resource Strain: Low Risk  (07/17/2021)   Overall Financial Resource Strain (CARDIA)    Difficulty of Paying Living Expenses: Not very hard  Food Insecurity: No Food Insecurity (07/17/2021)   Hunger Vital Sign    Worried About Running Out of Food in the Last Year: Never true    Ran Out of Food in the Last Year: Never true  Transportation Needs: No Transportation Needs (07/17/2021)   PRAPARE - Hydrologist (Medical): No    Lack of Transportation (Non-Medical): No  Physical Activity: Inactive (07/17/2021)   Exercise Vital Sign    Days of Exercise per Week: 0 days    Minutes of Exercise per Session: 0 min  Stress: Stress Concern Present (07/17/2021)   [place]    Feeling of Stress : Very much  Social Connections: Socially Isolated (07/17/2021)   Social Connection and Isolation Panel [NHANES]    Frequency of Communication with Friends and Family:  More than three times a week    Frequency of Social Gatherings with Friends and Family: More than three times a week    Attends Religious Services: Never    Marine scientist or Organizations: No    Attends Archivist Meetings: Never    Marital Status: Divorced  Human resources officer Violence: Not At Risk (07/17/2021)   Humiliation, Afraid, Rape, and Kick questionnaire    Fear of Current or Ex-Partner: No    Emotionally Abused: No    Physically Abused: No    Sexually Abused: No     ROS: '[x]'  Positive   '[ ]'  Negative   '[ ]'  All sytems reviewed and are negative *** Cardiac: '[]'  chest pain/pressure '[]'  SOB/DOE  Vascular: '[]'  pain in legs while walking '[]'  pain in feet when lying flat '[]'  swelling in legs  Pulmonary: '[]'  asthma '[]'  wheezing  Neurologic: '[]'  hx CVA/TIA  Hematologic: '[]'  bleeding problems  GI '[]'  GERD  GU: '[x]'  CKD/renal failure  '[]'  HD---'[]'  M/W/F '[]'  T/T/S  Psychiatric: '[]'  hx of major depression  Integumentary: '[]'  rashes '[]'  ulcers  Constitutional: '[]'  fever '[]'  chills   PHYSICAL EXAMINATION:  ***   General:  WDWN male in NAD Gait: Not observed HENT: WNL Pulmonary: normal non-labored breathing  Cardiac: {Desc; regular/irreg:14544}, {With/Without:20273} carotid bruit*** Abdomen: soft, NT Skin: {With/Without:20273} rashes Vascular Exam/Pulses:   Right Left  Radial {Exam; arterial pulse strength 0-4:30167} {Exam; arterial pulse strength 0-4:30167}  Ulnar {Exam; arterial pulse strength 0-4:30167} {Exam; arterial pulse strength 0-4:30167}   Extremities:  *** Musculoskeletal: no  muscle wasting or atrophy  Neurologic: A&O X 3  Non-Invasive Vascular Imaging:   Upper Extremity Vein Mapping on 06/17/2022: ***   ASSESSMENT/PLAN: 66 y.o. male with ESRD here for evaluation of his hemodialysis access with hx of  LUA AVG on 02/28/2022 by Dr. Carlis Abbott.  He was seen in July for concerns of a knot under the axilla incision here for recheck.   -*** -pt is  on dialysis on *** -discussed with pt that access does not last forever and will need intervention or even new access at some point.  -pt is *** hand dominant    Leontine Locket, [facility] [phone]  Clinic MD:   Carlis Abbott

## 2022-06-17 ENCOUNTER — Encounter: Payer: Self-pay | Admitting: Physician Assistant

## 2022-06-17 ENCOUNTER — Emergency Department (HOSPITAL_COMMUNITY)
Admission: EM | Admit: 2022-06-17 | Discharge: 2022-06-18 | Disposition: A | Discharge status: Skilled Nursing Facility | Payer: Medicare HMO | Attending: Emergency Medicine | Admitting: Emergency Medicine

## 2022-06-17 ENCOUNTER — Emergency Department (HOSPITAL_COMMUNITY): Payer: Medicare HMO

## 2022-06-17 ENCOUNTER — Ambulatory Visit (INDEPENDENT_AMBULATORY_CARE_PROVIDER_SITE_OTHER)
Admission: RE | Admit: 2022-06-17 | Discharge: 2022-06-17 | Disposition: A | Discharge status: Home / Self Care | Payer: Medicare HMO | Source: Ambulatory Visit | Attending: Vascular Surgery | Admitting: Vascular Surgery

## 2022-06-17 ENCOUNTER — Ambulatory Visit (INDEPENDENT_AMBULATORY_CARE_PROVIDER_SITE_OTHER): Payer: Medicare HMO | Admitting: Physician Assistant

## 2022-06-17 VITALS — BP 212/103 | HR 84 | Temp 98.4°F | Resp 20 | Ht 72.0 in

## 2022-06-17 DIAGNOSIS — I5030 Unspecified diastolic (congestive) heart failure: Secondary | ICD-10-CM | POA: Diagnosis not present

## 2022-06-17 DIAGNOSIS — R778 Other specified abnormalities of plasma proteins: Secondary | ICD-10-CM

## 2022-06-17 DIAGNOSIS — N186 End stage renal disease: Secondary | ICD-10-CM | POA: Insufficient documentation

## 2022-06-17 DIAGNOSIS — R7989 Other specified abnormal findings of blood chemistry: Secondary | ICD-10-CM | POA: Insufficient documentation

## 2022-06-17 DIAGNOSIS — J9601 Acute respiratory failure with hypoxia: Secondary | ICD-10-CM | POA: Diagnosis not present

## 2022-06-17 DIAGNOSIS — Z992 Dependence on renal dialysis: Secondary | ICD-10-CM | POA: Insufficient documentation

## 2022-06-17 DIAGNOSIS — J810 Acute pulmonary edema: Secondary | ICD-10-CM | POA: Diagnosis not present

## 2022-06-17 DIAGNOSIS — Z21 Asymptomatic human immunodeficiency virus [HIV] infection status: Secondary | ICD-10-CM | POA: Diagnosis not present

## 2022-06-17 DIAGNOSIS — D631 Anemia in chronic kidney disease: Secondary | ICD-10-CM | POA: Insufficient documentation

## 2022-06-17 DIAGNOSIS — R0602 Shortness of breath: Secondary | ICD-10-CM | POA: Diagnosis present

## 2022-06-17 DIAGNOSIS — Z79899 Other long term (current) drug therapy: Secondary | ICD-10-CM | POA: Diagnosis not present

## 2022-06-17 DIAGNOSIS — E1122 Type 2 diabetes mellitus with diabetic chronic kidney disease: Secondary | ICD-10-CM | POA: Diagnosis not present

## 2022-06-17 DIAGNOSIS — Z794 Long term (current) use of insulin: Secondary | ICD-10-CM | POA: Insufficient documentation

## 2022-06-17 DIAGNOSIS — I132 Hypertensive heart and chronic kidney disease with heart failure and with stage 5 chronic kidney disease, or end stage renal disease: Secondary | ICD-10-CM | POA: Diagnosis not present

## 2022-06-17 NOTE — ED Notes (Signed)
Patient transported to X-ray 

## 2022-06-17 NOTE — ED Triage Notes (Signed)
PT BIB [facility]  for SOB/HTN.  Pt is on RA at baseline. When EMS arrived the pt was on 6L. They took him off and he dropped to 89%. EMS kept him on 2-4L and he satted 95% or better.  PT was HTN at 198/112.  PT is a dialysis pt T, TH, Sat. Pt states he had dialysis today.

## 2022-06-17 NOTE — ED Provider Notes (Signed)
[facility]GENC[facility] DEPARTMENT Provider Note   CSN: [id number] Arrival date & time: 06/17/22  2329     History {Add pertinent medical, surgical, social history, OB history to HPI:1} Chief complaint: Shortness of breath  Richard [name] is a 66 y.o. male.  The history is provided by the patient and the EMS personnel.  He has history of hypertension, diabetes, hyperlipidemia, stroke with residual right hemiparesis, seizure disorder, end-stage renal disease on hemodialysis, diastolic heart failure, HIV disease and is brought in by ambulance because of shortness of breath.  He gets dialysis Tuesday-Thursday-Saturday and states he did have dialysis today but the session had to be cut short.  EMS noted he was hypoxic at home with oxygen saturation 89% on room air but improved on supplemental oxygen.  He was noted to be hypertensive with blood pressure 198/112.  He denies chest pain, heaviness, tightness, pressure.  He denies any cough or fever.   Home Medications Prior to Admission medications   Medication Sig Start Date End Date Taking? Authorizing Provider  ACCU-CHEK AVIVA PLUS test strip USE 1 strip TO test blood sugar 2 TIMES DAILY TO 3 TIMES DAILY 06/10/19   [name], MD  acetaminophen (TYLENOL) 325 MG tablet Take 650 mg by mouth every 6 (six) hours as needed for moderate pain, fever or mild pain.    [provider]  b complex-vitamin c-folic acid (NEPHRO-VITE) 0.8 MG TABS tablet Take 1 tablet by mouth daily.    [provider]  blood glucose meter kit and supplies KIT Dispense based on patient and insurance preference. Check blood sugar 4 times a day 06/11/18   [name], MD  calcium acetate (PHOSLO) 667 MG capsule Take 2,001 mg by mouth 3 (three) times daily with meals.    [provider]  carvedilol (COREG) 12.5 MG tablet Take 12.5 mg by mouth 2 (two) times daily.    [provider]  cloNIDine (CATAPRES) 0.1  MG tablet Take 1 tablet (0.1 mg total) by mouth 3 (three) times daily. 02/21/21   Hosie Poisson, MD  clopidogrel (PLAVIX) 75 MG tablet Take 1 tablet (75 mg total) by mouth daily. 03/07/20   [name], MD  Darbepoetin Alfa (ARANESP) 60 MCG/0.3ML SOSY injection Inject 0.3 mLs (60 mcg total) into the vein every Saturday with hemodialysis. 01/19/21   Bonnielee Haff, MD  docusate sodium (COLACE) 100 MG capsule Take 100 mg by mouth daily.    [provider]  dolutegravir-lamiVUDine (DOVATO) 50-300 MG tablet Take 1 tablet by mouth daily. 03/19/22   Truman Hayward, MD  doxepin (SINEQUAN) 75 MG capsule Take 75 mg by mouth at bedtime.    [provider]  epoetin alfa-epbx (RETACRIT) 38756 UNIT/ML injection Inject 20,000 Units into the skin every Monday.    [provider]  Ergocalciferol 50 MCG (2000 UT) TABS Take 2,000 Units by mouth daily.    [provider]  hydrALAZINE (APRESOLINE) 10 MG tablet Take 10 mg by mouth 3 (three) times daily.    [provider]  insulin glargine-yfgn (SEMGLEE) 100 UNIT/ML Pen Inject 5 Units into the skin at bedtime.    [provider]  Insulin Pen Needle (SURE COMFORT PEN NEEDLES) 31G X 8 MM MISC Use as directed twice daily with Novolog flex pen 02/10/19   Koberlein, Junell C, MD  levETIRAcetam (KEPPRA) 500 MG tablet Take 500 mg by mouth 2 (two) times daily.    [provider]  Lidocaine  3 % CREA Apply 1 application topically See admin instructions. Apply 1 times a day on Tuesday, Thursday and Saturday prior to dialysis    [provider]  Misc. Devices (COMMODE 3-IN-1) MISC Use as directed 02/24/20   [name], MD  Misc. Devices (TRANSFER BENCH) MISC Use as directed 02/24/20   Koberlein, [name], MD  ondansetron (ZOFRAN) 4 MG tablet Take 4 mg by mouth every 8 (eight) hours as needed for vomiting or nausea. 12/09/21   [provider]  oxyCODONE-acetaminophen (PERCOCET) 5-325 MG  tablet Take 1 tablet by mouth every 6 (six) hours as needed for severe pain. 02/28/22 02/28/23  Schuh, McKenzi P, PA-C  pantoprazole (PROTONIX) 40 MG tablet TAKE 1 TABLET BY MOUTH EVERY DAY 01/23/20   Koberlein, Andris Flurry C, MD  rosuvastatin (CRESTOR) 5 MG tablet Take 5 mg by mouth daily. 12/03/21   [provider]  sildenafil (REVATIO) 20 MG tablet Take 1 tablet (20 mg total) by mouth 3 (three) times daily as needed. 03/13/22   Kennyth Arnold, FNP  sulfamethoxazole-trimethoprim (BACTRIM DS) 800-160 MG tablet Take 1 tablet by mouth 3 (three) times a week. Patient taking differently: Take 1 tablet by mouth every Monday, Wednesday, and Friday. Once daily on M-W F continuous 04/23/20   Little Ishikawa, MD  tamsulosin (FLOMAX) 0.4 MG CAPS capsule TAKE 1 CAPSULE BY MOUTH EVERY DAY 03/28/20   Koberlein, [name], MD  traMADol (ULTRAM) 50 MG tablet Take 50 mg by mouth every 8 (eight) hours as needed for moderate pain. 05/01/21   [provider]  TRUEPLUS INSULIN SYRINGE 31G X 5/16" 1 ML MISC Use pen needle with insulin 2 times daily 03/31/19   [name], MD      Allergies    Oxycodone    Review of Systems   Review of Systems  All other systems reviewed and are negative.   Physical Exam Updated Vital Signs BP (!) 209/97 (BP Location: Right Arm)   Pulse 93   Temp 97.8 F (36.6 C)   Resp 20   SpO2 100%  Physical Exam Vitals and nursing note reviewed.   66 year old male, resting comfortably and in no acute distress. Vital signs are significant for elevated blood pressure. Oxygen saturation is 100%, which is normal. Head is normocephalic and atraumatic. PERRLA, EOMI. Oropharynx is clear. Neck is nontender and supple without adenopathy or JVD. Back is nontender and there is no CVA tenderness. Lungs have rales halfway up. Chest is nontender.  Dialysis access catheter present on the right. Heart has regular rate and rhythm without murmur. Abdomen is soft, flat, nontender without  masses or hepatosplenomegaly and peristalsis is normoactive. Extremities have 2+ edema, full range of motion is present.  AV fistula is present in the left upper arm with thrill present. Skin is warm and dry without rash. Neurologic: He is somnolent but arousable, speech is somewhat slurred, oriented x3, cranial nerves are grossly intact, there is a mild right hemiparesis.  ED Results / Procedures / Treatments   Labs (all labs ordered are listed, but only abnormal results are displayed) Labs Reviewed  BASIC METABOLIC PANEL  CBC WITH DIFFERENTIAL/PLATELET  I-STAT ARTERIAL BLOOD GAS, ED    EKG None  Radiology VAS US DUPLEX DIALYSIS ACCESS (AVF,AVG)  Result Date: 06/17/2022 DIALYSIS ACCESS Patient Name:  Richard [name]  Date of Exam:   06/17/2022 Medical Rec #: [id number]              Accession #:  [id number] Date of Birth: [birth year]              Patient Gender: M Patient Age:   22 years Exam Location:  [facility] Procedure:      VAS US DUPLEX DIALYSIS ACCESS (AVF, AVG) Referring Phys: Monica Martinez --------------------------------------------------------------------------------  Reason for Exam: Routine follow up. Access Site: Left Upper Extremity. Access Type: Upper arm straight graft. History: Inserted on 02/28/22. Failed left BC AVF.          Large lumps at [facility] and axilla. Performing Technologist: Ralene Cork RVT  Examination Guidelines: A complete evaluation includes B-mode imaging, spectral Doppler, color Doppler, and power Doppler as needed of all accessible portions of each vessel. Unilateral testing is considered an integral part of a complete examination. Limited examinations for reoccurring indications may be performed as noted.  Findings:   +--------------------+----------+-----------------+----------------------+ AVG                 PSV (cm/s)Flow Vol (mL/min)       Describe         +--------------------+----------+-----------------+----------------------+ Native artery inflow   238          1902                              +--------------------+----------+-----------------+----------------------+ Arterial anastomosis   267                            hematoma        +--------------------+----------+-----------------+----------------------+ Prox graft             608                     compressed by hematoma +--------------------+----------+-----------------+----------------------+ Mid graft              172                                            +--------------------+----------+-----------------+----------------------+ Distal graft           202                            hematoma        +--------------------+----------+-----------------+----------------------+ Venous anastomosis     338                            hematoma        +--------------------+----------+-----------------+----------------------+ Venous outflow         233                                            +--------------------+----------+-----------------+----------------------+  Summary: Patent AVG with no thrombus seen. Large hematomas at the antecubitum and the axilla.  *See table(s) above for measurements and observations.  Diagnosing physician: Monica Martinez MD Electronically signed by Monica Martinez MD on 06/17/2022 at 8:53:56 AM.    --------------------------------------------------------------------------------   Final     Procedures Procedures  {Document cardiac monitor, telemetry assessment procedure when appropriate:1}  Medications Ordered in ED Medications - No data to display  ED Course/ Medical Decision Making/ A&P  Medical Decision Making Amount and/or Complexity of Data Reviewed Labs: ordered. Radiology: ordered.   Dyspnea with hypoxia with physical findings strongly suggestive of heart failure.  In the setting of  incomplete dialysis, I feel this is likely fluid overload.  Will check chest x-ray to rule out pneumonia and look for signs of heart failure.  Also, with somnolence in the setting of respiratory failure and on supplemental oxygen, I have ordered an ABG to rule out hypercarbia.  I have ordered an electrocardiogram to look for evidence of acute myocardial infarction.  I have ordered basic metabolic panel, CBC, troponin.  He is currently being maintained on oxygen at 2 L/min with good oxygen saturation.  I have reviewed his old records, and he has had 3 admissions for the for emergent dialysis on 04/14/2021, 02/17/2021 and 01/10/2021.  {Document critical care time when appropriate:1} {Document review of labs and clinical decision tools ie heart score, Chads2Vasc2 etc:1}  {Document your independent review of radiology images, and any outside records:1} {Document your discussion with family members, caretakers, and with consultants:1} {Document social determinants of health affecting pt's care:1} {Document your decision making why or why not admission, treatments were needed:1} Final Clinical Impression(s) / ED Diagnoses Final diagnoses:  None    Rx / DC Orders ED Discharge Orders     None

## 2022-06-17 NOTE — ED Notes (Signed)
Pt is lethargic.

## 2022-06-18 ENCOUNTER — Encounter (HOSPITAL_COMMUNITY): Payer: Self-pay

## 2022-06-18 DIAGNOSIS — J9601 Acute respiratory failure with hypoxia: Secondary | ICD-10-CM | POA: Diagnosis not present

## 2022-06-18 LAB — CBC WITH DIFFERENTIAL/PLATELET
Abs Immature Granulocytes: 0.03 10*3/uL (ref 0.00–0.07)
Basophils Absolute: 0.1 10*3/uL (ref 0.0–0.1)
Basophils Relative: 1 %
Eosinophils Absolute: 0.2 10*3/uL (ref 0.0–0.5)
Eosinophils Relative: 2 %
HCT: 32.3 % — ABNORMAL LOW (ref 39.0–52.0)
Hemoglobin: 10.4 g/dL — ABNORMAL LOW (ref 13.0–17.0)
Immature Granulocytes: 0 %
Lymphocytes Relative: 12 %
Lymphs Abs: 1.2 10*3/uL (ref 0.7–4.0)
MCH: 31.1 pg (ref 26.0–34.0)
MCHC: 32.2 g/dL (ref 30.0–36.0)
MCV: 96.7 fL (ref 80.0–100.0)
Monocytes Absolute: 0.7 10*3/uL (ref 0.1–1.0)
Monocytes Relative: 7 %
Neutro Abs: 8.1 10*3/uL — ABNORMAL HIGH (ref 1.7–7.7)
Neutrophils Relative %: 78 %
Platelets: 173 10*3/uL (ref 150–400)
RBC: 3.34 MIL/uL — ABNORMAL LOW (ref 4.22–5.81)
RDW: 20.3 % — ABNORMAL HIGH (ref 11.5–15.5)
WBC: 10.2 10*3/uL (ref 4.0–10.5)
nRBC: 0 % (ref 0.0–0.2)

## 2022-06-18 LAB — I-STAT VENOUS BLOOD GAS, ED
Acid-base deficit: 8 mmol/L — ABNORMAL HIGH (ref 0.0–2.0)
Bicarbonate: 18 mmol/L — ABNORMAL LOW (ref 20.0–28.0)
Calcium, Ion: 1.06 mmol/L — ABNORMAL LOW (ref 1.15–1.40)
HCT: 33 % — ABNORMAL LOW (ref 39.0–52.0)
Hemoglobin: 11.2 g/dL — ABNORMAL LOW (ref 13.0–17.0)
O2 Saturation: 96 %
Potassium: 4.3 mmol/L (ref 3.5–5.1)
Sodium: 141 mmol/L (ref 135–145)
TCO2: 19 mmol/L — ABNORMAL LOW (ref 22–32)
pCO2, Ven: 38.8 mmHg — ABNORMAL LOW (ref 44–60)
pH, Ven: 7.274 (ref 7.25–7.43)
pO2, Ven: 96 mmHg — ABNORMAL HIGH (ref 32–45)

## 2022-06-18 LAB — BASIC METABOLIC PANEL
Anion gap: 19 — ABNORMAL HIGH (ref 5–15)
BUN: 56 mg/dL — ABNORMAL HIGH (ref 8–23)
CO2: 16 mmol/L — ABNORMAL LOW (ref 22–32)
Calcium: 8.6 mg/dL — ABNORMAL LOW (ref 8.9–10.3)
Chloride: 108 mmol/L (ref 98–111)
Creatinine, Ser: 12.54 mg/dL — ABNORMAL HIGH (ref 0.61–1.24)
GFR, Estimated: 4 mL/min — ABNORMAL LOW (ref >60)
Glucose, Bld: 102 mg/dL — ABNORMAL HIGH (ref 70–99)
Potassium: 4.4 mmol/L (ref 3.5–5.1)
Sodium: 143 mmol/L (ref 135–145)

## 2022-06-18 LAB — TROPONIN I (HIGH SENSITIVITY): Troponin I (High Sensitivity): 49 ng/L — ABNORMAL HIGH (ref <18)

## 2022-06-18 MED ORDER — LABETALOL HCL 5 MG/ML IV SOLN
20.0000 mg | Freq: Once | INTRAVENOUS | Status: AC
  Administered 2022-06-18: 20 mg via INTRAVENOUS
  Filled 2022-06-18: qty 4

## 2022-06-18 MED ORDER — CHLORHEXIDINE GLUCONATE CLOTH 2 % EX PADS: 6.0000 | MEDICATED_PAD | Freq: Every day | CUTANEOUS | Status: DC

## 2022-06-18 NOTE — ED Notes (Signed)
MD at bedside and aware that pt does not feel he needs HD, when have attempted to obtain consent the pt is refusing to sign consent

## 2022-06-18 NOTE — ED Provider Notes (Signed)
  Physical Exam  BP (!) 190/93 (BP Location: Right Arm)   Pulse 74   Temp 98.5 F (36.9 C) (Oral)   Resp 16   SpO2 100%   Physical Exam  Procedures  Procedures  ED Course / MDM   Clinical Course as of 06/18/22 1211  Wed Jun 18, 2022  0656 Assumed care from Dr Roxanne Mins. Pt awaiting iHD at this time. Presented with SOB. Now is on 2L [place] and appears volume overloaded. Will need to check a pulse ox on him prior to dc. VBG did not show CO2 retention.  [RP]  0300 Seen by nephrologist.  Arranging for dialysis. [RP]  [place] charge nurse who states that the patient's need hepatitis panel antigen results prior to coming upstairs.  [RP]  1210 Patient refusing HD at this time.  He was taken off of oxygen and is satting 97% on room air.  Lungs are clear to auscultation bilaterally.  Feel patient does have capacity to refuse at this time.  He was informed that he will need to follow-up with his dialysis center soon as possible.  Also instructed to follow-up with his PCP within the next 48 hours.  Return precautions discussed prior to discharge. [RP]    Clinical Course User Index [RP] Fransico Meadow, MD   Medical Decision Making Amount and/or Complexity of Data Reviewed Labs: ordered. Radiology: ordered.  Risk Prescription drug management.     Fransico Meadow, MD 06/18/22 [id number]

## 2022-06-18 NOTE — Consult Note (Signed)
Reason for Consult: To manage dialysis and dialysis related needs Referring Physician: Dr Richard [name] is an 67 y.o. male.  HPI: Pt is a 67M with a PMH ESRD on TTS at [facility], HTN, HLD, h/o previous stroke, seizure disorder, and HIV who is now seen in consultation at the request of Dr Richard [name] for management of ESRD and provision of HD.    Pt was brought by his SNF to [facility] for acute hypoxic RF.  PT reports that he had an "episode" or anxiety attack last night and that's what brought him in.  He says he felt like he couldn't breathe.    In ED, hypoxic to 89% on RA and hypertensive.  Placed on 2L [place] O2.  K was in the 4s.  CXR showed pulm edema +/- L mid-lung infiltrate vs congestion.  In this setting we are asked to see.    He is well-known to me from the dialysis unit.  Nonadherent behavior- shortens and misses treatments regularly.  He had a VVS appt yesterday to assess AVG- patent graft with large hematomas present but was cleared to stick it.    Dialyzes at [facility] TTS 2nd shift Treatment time 4 hrs 2K 2Ca bath Mircera  Hectorol - Ec[employer] not working and these are from [employer]  Past Medical History:  Diagnosis Date   COVID-19 05/16/2020   Diabetes mellitus, type II, insulin dependent ([place])    ESRD on hemodialysis ([place]) 08/30/2021   GERD (gastroesophageal reflux disease)    Hematoma 03/19/2022   HIV (human immunodeficiency virus infection) ([place])    Hypertension    Meningoencephalitis    Neuropathy in diabetes ([place])    bilat feet   Recurrent falls    possible small SAH right frontal lobe 12/19/21 (Plavix held x 1 week); left transverse foramen C3 fracture 11/04/21   Ruptured lumbar disc    Seizure disorder ([place])    Stroke ([place]) 2013   residual right sided weakness (arm>leg)    Past Surgical History:  Procedure Laterality Date   AV FISTULA PLACEMENT Left 04/25/2020   Procedure: LEFT ARM BRACHIOCEPHALIC ARTERIOVENOUS (AV) FISTULA CREATION;  Surgeon: Rosetta Posner, MD;   Location: [facility] OR;  Service: Vascular;  Laterality: Left;   AV FISTULA PLACEMENT Left 02/28/2022   Procedure: LEFT ARM INSERTION OF ARTERIOVENOUS (AV) GORE-TEX GRAFT ARM;  Surgeon: Marty Heck, MD;  Location: [facility] OR;  Service: Vascular;  Laterality: Left;   IR FLUORO GUIDE CV LINE RIGHT  04/13/2020   IR FLUORO GUIDE CV LINE RIGHT  01/31/2022   IR REMOVAL TUN CV CATH W/O FL  01/14/2021   IR US GUIDE VASC ACCESS RIGHT  04/13/2020   IR US GUIDE VASC ACCESS RIGHT  01/31/2022   IR US GUIDE VASC ACCESS RIGHT  01/31/2022   KNEE ARTHROSCOPY Bilateral     Family History  Problem Relation Age of Onset   Other Mother    Stroke Father    Hypertension Father    Epilepsy Father    Breast cancer Sister    Stroke Brother    Heart attack Brother    Diabetes Sister     Social History:  reports that he has been smoking cigarettes. He has a 17.50 pack-year smoking history. He has been exposed to tobacco smoke. He has never used smokeless tobacco. He reports that he does not currently use drugs after having used the following drugs: Marijuana. He reports that he does not drink alcohol.  Allergies:  Allergies  Allergen Reactions   Oxycodone Nausea And Vomiting    Medications: Scheduled:  Chlorhexidine Gluconate Cloth  6 each Topical Q0600     Results for orders placed or performed during the hospital encounter of 06/17/22 (from the past 48 hour(s))  Basic metabolic panel     Status: Abnormal   Collection Time: 06/18/22 12:57 AM  Result Value Ref Range   Sodium 143 135 - 145 mmol/L   Potassium 4.4 3.5 - 5.1 mmol/L   Chloride 108 98 - 111 mmol/L   CO2 16 (L) 22 - 32 mmol/L   Glucose, Bld 102 (H) 70 - 99 mg/dL    Comment: Glucose reference range applies only to samples taken after fasting for at least 8 hours.   BUN 56 (H) 8 - 23 mg/dL   Creatinine, Ser 12.54 (H) 0.61 - 1.24 mg/dL   Calcium 8.6 (L) 8.9 - 10.3 mg/dL   GFR, Estimated 4 (L) >60 mL/min    Comment: (NOTE) Calculated using the  CKD-EPI Creatinine Equation (2021)    Anion gap 19 (H) 5 - 15    Comment: Performed at [facility] [address] [place], [place] [address]  CBC with Differential     Status: Abnormal   Collection Time: 06/18/22 12:57 AM  Result Value Ref Range   WBC 10.2 4.0 - 10.5 K/uL   RBC 3.34 (L) 4.22 - 5.81 MIL/uL   Hemoglobin 10.4 (L) 13.0 - 17.0 g/dL   HCT 32.3 (L) 39.0 - 52.0 %   MCV 96.7 80.0 - 100.0 fL   MCH 31.1 26.0 - 34.0 pg   MCHC 32.2 30.0 - 36.0 g/dL   RDW 20.3 (H) 11.5 - 15.5 %   Platelets 173 150 - 400 K/uL    Comment: REPEATED TO VERIFY   nRBC 0.0 0.0 - 0.2 %   Neutrophils Relative % 78 %   Neutro Abs 8.1 (H) 1.7 - 7.7 K/uL   Lymphocytes Relative 12 %   Lymphs Abs 1.2 0.7 - 4.0 K/uL   Monocytes Relative 7 %   Monocytes Absolute 0.7 0.1 - 1.0 K/uL   Eosinophils Relative 2 %   Eosinophils Absolute 0.2 0.0 - 0.5 K/uL   Basophils Relative 1 %   Basophils Absolute 0.1 0.0 - 0.1 K/uL   Immature Granulocytes 0 %   Abs Immature Granulocytes 0.03 0.00 - 0.07 K/uL    Comment: Performed at [facility], [place] [address] [place], [place] [address]  Troponin I (High Sensitivity)     Status: Abnormal   Collection Time: 06/18/22 12:57 AM  Result Value Ref Range   Troponin I (High Sensitivity) 49 (H) <18 ng/L    Comment: (NOTE) Elevated high sensitivity troponin I (hsTnI) values and significant  changes across serial measurements may suggest ACS but many other  chronic and acute conditions are known to elevate hsTnI results.  Refer to the "Links" section for chest pain algorithms and additional  guidance. Performed at [facility], [place] [address], [place],  [address]   I-Stat venous blood gas, [facility] ED only)     Status: Abnormal   Collection Time: 06/18/22  2:35 AM  Result Value Ref Range   pH, Ven 7.274 7.25 - 7.43   pCO2, Ven 38.8 (L) 44 - 60 mmHg   pO2, Ven 96 (H) 32 - 45 mmHg   Bicarbonate 18.0 (L) 20.0 - 28.0 mmol/L   TCO2 19 (L) 22 - 32 mmol/L    O2  Saturation 96 %   Acid-base deficit 8.0 (H) 0.0 - 2.0 mmol/L   Sodium 141 135 - 145 mmol/L   Potassium 4.3 3.5 - 5.1 mmol/L   Calcium, Ion 1.06 (L) 1.15 - 1.40 mmol/L   HCT 33.0 (L) 39.0 - 52.0 %   Hemoglobin 11.2 (L) 13.0 - 17.0 g/dL   Sample type VENOUS     DG Chest 2 View  Result Date: 06/18/2022 CLINICAL DATA:  Shortness of breath. EXAM: CHEST - 2 VIEW COMPARISON:  12/19/2021. FINDINGS: The heart is enlarged and the mediastinal contour is stable. Atherosclerotic calcification of the aorta is noted. The pulmonary vasculature is distended. Interstitial prominence is noted bilaterally. There is focal airspace disease in the mid left lung. No effusion or pneumothorax. No acute osseous abnormality. A right internal jugular central venous catheter terminates over the right atrium. IMPRESSION: 1. Cardiomegaly with pulmonary vascular congestion. 2. Interstitial prominence bilaterally and focal airspace disease in the mid left lung, possible edema or infiltrate. Electronically Signed   By: Brett Fairy M.D.   On: 06/18/2022 00:10   VAS US DUPLEX DIALYSIS ACCESS (AVF,AVG)  Result Date: 06/17/2022 DIALYSIS ACCESS Patient Name:  Richard [name]  Date of Exam:   06/17/2022 Medical Rec #: [id number]              Accession #:    [id number] Date of Birth: July 08, 1956              Patient Gender: M Patient Age:   38 years Exam Location:  [facility] Procedure:      VAS US DUPLEX DIALYSIS ACCESS (AVF, AVG) Referring Phys: Monica Martinez --------------------------------------------------------------------------------  Reason for Exam: Routine follow up. Access Site: Left Upper Extremity. Access Type: Upper arm straight graft. History: Inserted on 02/28/22. Failed left BC AVF.          Large lumps at [facility] and axilla. Performing Technologist: Ralene Cork RVT  Examination Guidelines: A complete evaluation includes B-mode imaging, spectral Doppler, color Doppler, and power Doppler as needed  of all accessible portions of each vessel. Unilateral testing is considered an integral part of a complete examination. Limited examinations for reoccurring indications may be performed as noted.  Findings:   +--------------------+----------+-----------------+----------------------+ AVG                 PSV (cm/s)Flow Vol (mL/min)       Describe        +--------------------+----------+-----------------+----------------------+ Native artery inflow   238          1902                              +--------------------+----------+-----------------+----------------------+ Arterial anastomosis   267                            hematoma        +--------------------+----------+-----------------+----------------------+ Prox graft             608                     compressed by hematoma +--------------------+----------+-----------------+----------------------+ Mid graft              172                                            +--------------------+----------+-----------------+----------------------+  Distal graft           202                            hematoma        +--------------------+----------+-----------------+----------------------+ Venous anastomosis     338                            hematoma        +--------------------+----------+-----------------+----------------------+ Venous outflow         233                                            +--------------------+----------+-----------------+----------------------+  Summary: Patent AVG with no thrombus seen. Large hematomas at the antecubitum and the axilla.  *See table(s) above for measurements and observations.  Diagnosing physician: Monica Martinez MD Electronically signed by Monica Martinez MD on 06/17/2022 at 8:53:56 AM.    --------------------------------------------------------------------------------   Final     ROS: all other systems reviewed and are negative except as per HPI Blood pressure (!) 168/88,  pulse 72, temperature 98.1 F (36.7 C), temperature source Oral, resp. rate 14, SpO2 100 %. GEN NAD, sitting in stretcher, easily arousable HEENT EOMI PERRL NECK + JVD PULM bilateral crackles CV RRR ABD soft EXT  3+ LE edema NEURO Aao x 3 ACCESS: TDC and L AVG  Assessment/Plan: 1 Acute hypoxic RF: d/t underdialysis and volume overload.  Needs HD today which we discussed and orders placed.  He says he does not want dialysis today and he has declined to sign the HD consent.   2 ESRD: TTS- chronically underdialyzed, needs HD today and orders are in 3 Hypertension: chronically hypertensive, he becomes symptomatic around SBP of 130 4. Anemia of ESRD:  on max ESA as OP- will get records of when was last given 5. Metabolic Bone Disease:  binders and vits when eating 6.  HIV: meds per primary 7.  H/o stroke: R sided weakness 8.  Dispo: pending  Madelon Lips 06/18/2022, 11:28 AM

## 2022-06-18 NOTE — ED Notes (Signed)
PTAR was called

## 2022-06-18 NOTE — ED Notes (Signed)
MD notified of pt elevated BP

## 2022-06-18 NOTE — Progress Notes (Signed)
RT was going to attempt ABG on right side but pt unable to lay hand down due to previous stroke. RN made RT aware pt has fistula on the left side. RT mentioned VBG once pt has an IV. RN stated he would let MD know.

## 2022-06-18 NOTE — Discharge Instructions (Signed)
Today you were seen in the emergency department for your shortness of breath.    In the emergency department you had a work-up that was reassuring and were offered dialysis but decided to leave without it.    At home, please go to dialysis soon as possible.    Check your MyChart online for the results of any tests that had not resulted by the time you left the emergency department.   Follow-up with your primary doctor in 2-3 days regarding your visit.    Return immediately to the emergency department if you experience any of the following: Difficulty breathing, or any other concerning symptoms.    Thank you for visiting our Emergency Department. It was a pleasure taking care of you today.

## 2022-06-19 ENCOUNTER — Emergency Department (HOSPITAL_COMMUNITY): Payer: Medicare HMO

## 2022-06-19 ENCOUNTER — Other Ambulatory Visit: Payer: Self-pay

## 2022-06-19 ENCOUNTER — Emergency Department (HOSPITAL_COMMUNITY)
Admission: EM | Admit: 2022-06-19 | Discharge: 2022-06-20 | Disposition: A | Discharge status: Skilled Nursing Facility | Payer: Medicare HMO | Attending: Emergency Medicine | Admitting: Emergency Medicine

## 2022-06-19 ENCOUNTER — Encounter (HOSPITAL_COMMUNITY): Payer: Self-pay | Admitting: Emergency Medicine

## 2022-06-19 DIAGNOSIS — Z7902 Long term (current) use of antithrombotics/antiplatelets: Secondary | ICD-10-CM | POA: Diagnosis not present

## 2022-06-19 DIAGNOSIS — B20 Human immunodeficiency virus [HIV] disease: Secondary | ICD-10-CM | POA: Diagnosis not present

## 2022-06-19 DIAGNOSIS — E1122 Type 2 diabetes mellitus with diabetic chronic kidney disease: Secondary | ICD-10-CM | POA: Diagnosis not present

## 2022-06-19 DIAGNOSIS — N186 End stage renal disease: Secondary | ICD-10-CM | POA: Diagnosis not present

## 2022-06-19 DIAGNOSIS — E8779 Other fluid overload: Secondary | ICD-10-CM | POA: Insufficient documentation

## 2022-06-19 DIAGNOSIS — R0602 Shortness of breath: Secondary | ICD-10-CM | POA: Diagnosis present

## 2022-06-19 DIAGNOSIS — Z79899 Other long term (current) drug therapy: Secondary | ICD-10-CM | POA: Diagnosis not present

## 2022-06-19 DIAGNOSIS — Z7984 Long term (current) use of oral hypoglycemic drugs: Secondary | ICD-10-CM | POA: Diagnosis not present

## 2022-06-19 DIAGNOSIS — Z992 Dependence on renal dialysis: Secondary | ICD-10-CM | POA: Diagnosis not present

## 2022-06-19 DIAGNOSIS — Z91199 Patient's noncompliance with other medical treatment and regimen due to unspecified reason: Secondary | ICD-10-CM | POA: Insufficient documentation

## 2022-06-19 DIAGNOSIS — I132 Hypertensive heart and chronic kidney disease with heart failure and with stage 5 chronic kidney disease, or end stage renal disease: Secondary | ICD-10-CM | POA: Diagnosis not present

## 2022-06-19 DIAGNOSIS — I509 Heart failure, unspecified: Secondary | ICD-10-CM | POA: Insufficient documentation

## 2022-06-19 DIAGNOSIS — Z794 Long term (current) use of insulin: Secondary | ICD-10-CM | POA: Diagnosis not present

## 2022-06-19 LAB — BASIC METABOLIC PANEL
Anion gap: 19 — ABNORMAL HIGH (ref 5–15)
BUN: 63 mg/dL — ABNORMAL HIGH (ref 8–23)
CO2: 15 mmol/L — ABNORMAL LOW (ref 22–32)
Calcium: 8.4 mg/dL — ABNORMAL LOW (ref 8.9–10.3)
Chloride: 107 mmol/L (ref 98–111)
Creatinine, Ser: 13.35 mg/dL — ABNORMAL HIGH (ref 0.61–1.24)
GFR, Estimated: 4 mL/min — ABNORMAL LOW (ref >60)
Glucose, Bld: 157 mg/dL — ABNORMAL HIGH (ref 70–99)
Potassium: 4.5 mmol/L (ref 3.5–5.1)
Sodium: 141 mmol/L (ref 135–145)

## 2022-06-19 LAB — CBC WITH DIFFERENTIAL/PLATELET
Abs Immature Granulocytes: 0.03 10*3/uL (ref 0.00–0.07)
Basophils Absolute: 0 10*3/uL (ref 0.0–0.1)
Basophils Relative: 0 %
Eosinophils Absolute: 0.1 10*3/uL (ref 0.0–0.5)
Eosinophils Relative: 0 %
HCT: 28.5 % — ABNORMAL LOW (ref 39.0–52.0)
Hemoglobin: 9 g/dL — ABNORMAL LOW (ref 13.0–17.0)
Immature Granulocytes: 0 %
Lymphocytes Relative: 5 %
Lymphs Abs: 0.6 10*3/uL — ABNORMAL LOW (ref 0.7–4.0)
MCH: 31.1 pg (ref 26.0–34.0)
MCHC: 31.6 g/dL (ref 30.0–36.0)
MCV: 98.6 fL (ref 80.0–100.0)
Monocytes Absolute: 1 10*3/uL (ref 0.1–1.0)
Monocytes Relative: 8 %
Neutro Abs: 9.8 10*3/uL — ABNORMAL HIGH (ref 1.7–7.7)
Neutrophils Relative %: 87 %
Platelets: 206 10*3/uL (ref 150–400)
RBC: 2.89 MIL/uL — ABNORMAL LOW (ref 4.22–5.81)
RDW: 20 % — ABNORMAL HIGH (ref 11.5–15.5)
WBC: 11.4 10*3/uL — ABNORMAL HIGH (ref 4.0–10.5)
nRBC: 0 % (ref 0.0–0.2)

## 2022-06-19 LAB — CBG MONITORING, ED
Glucose-Capillary: 68 mg/dL — ABNORMAL LOW (ref 70–99)
Glucose-Capillary: 102 mg/dL — ABNORMAL HIGH (ref 70–99)

## 2022-06-19 LAB — HEPATITIS B CORE ANTIBODY, TOTAL: Hep B Core Total Ab: NONREACTIVE

## 2022-06-19 LAB — HEPATITIS B SURFACE ANTIBODY,QUALITATIVE: Hep B S Ab: NONREACTIVE

## 2022-06-19 LAB — HEPATITIS C ANTIBODY: HCV Ab: NONREACTIVE

## 2022-06-19 LAB — HEPATITIS B SURFACE ANTIGEN: Hepatitis B Surface Ag: NONREACTIVE

## 2022-06-19 MED ORDER — CHLORHEXIDINE GLUCONATE CLOTH 2 % EX PADS: 6.0000 | MEDICATED_PAD | Freq: Every day | CUTANEOUS | Status: DC

## 2022-06-19 NOTE — ED Notes (Signed)
PTAR called for transport , 10 people in front of him

## 2022-06-19 NOTE — Progress Notes (Signed)
   06/19/22 0735  Therapy Vitals  Pulse Rate 94  Resp 18  BP (!) 176/70  Patient Position (if appropriate) Lying  MEWS Score/Color  MEWS Score 0  MEWS Score Color Green  Oxygen Therapy/Pulse Ox  O2 Device Nasal Cannula  O2 Therapy Oxygen  O2 Flow Rate (L/min) 3 L/min  SpO2 100 %   Pt was weaned off BiPAP to [place] 3L at is currently stable at this time.

## 2022-06-19 NOTE — Progress Notes (Signed)
Unable to get abg due to pt. Arm being contracted and other arm is restricted due to dialysis catheter. MD made aware by RT.

## 2022-06-19 NOTE — ED Notes (Signed)
Called respiratory at this time for pt to be taken off BIPAP to go to dialysis. Transport in dept.

## 2022-06-19 NOTE — ED Notes (Signed)
CBG 65 - pt given food and drink. PA notified.

## 2022-06-19 NOTE — Procedures (Signed)
Patient seen and examined on Hemodialysis. BP (!) 216/94 (BP Location: Right Arm) Comment: RN aware of BP being elevated  Pulse 88   Temp 98 F (36.7 C) (Oral)   Resp 16   Ht 6' (1.829 m)   Wt 88 kg   SpO2 100%   BMI 26.31 kg/m   QB 400 mL/ min via TDC, UF goal 4L  Tolerating treatment without complaints at this time.  Declined to use AVG- discussed risks of TDC.   Madelon Lips MD [facility] Pgr [phone] 9:32 AM

## 2022-06-19 NOTE — ED Notes (Signed)
Pt transported to dialysis via stretcher. Charge nurse aware.

## 2022-06-19 NOTE — ED Notes (Signed)
Patient refusing to let this RN attempt to get IV access or bloodwork.  MD Wickline made aware.

## 2022-06-19 NOTE — ED Triage Notes (Signed)
Patient BIB [facility] from [facility] for shob.  Patient seen here yesterday for same and was d/c'd, reports he got worse after d/c.  EMS reports patient was tachypenic at 44, on a NRB at 3L by facility, sats 67%.  EMS reports wheezing all over, placed on cpap.  On NRB upon arrival.  Patient was given dialysis yesterday.  5mg  albuterol

## 2022-06-19 NOTE — Progress Notes (Signed)
Patient ID: Richard [name], male   DOB: [birth year], 66 y.o.   MRN: [id number] Consulted by EDP to provide emergency dialysis to [place] who presented with acute hypoxic respiratory failure from volume overload. He apparently was here yesterday and set up for his dialysis but declined to go. "ER Dialysis" orders entered to undertake HD and potentially DC thereafter from the ER. In-patient dialysis unit will be contacted.    Elmarie Shiley MD [facility]. Office # [phone] Pager # [phone] 5:15 AM

## 2022-06-19 NOTE — Discharge Instructions (Addendum)
You received the full treatment of dialysis today.  Return to the emergency department if you experience any worsening shortness of breath.

## 2022-06-19 NOTE — Progress Notes (Signed)
Orders received to use AVG per Dr. Posey Pronto.  Patient refused to be cannulated and stated he would only dialyze using his HD catheter.  Dr. Hollie Salk present on unit and notified of patient request.

## 2022-06-19 NOTE — Progress Notes (Signed)
Received patient in bed to unit.  Alert and oriented.  Informed consent signed and in chart.   Treatment initiated: 0850 Treatment completed: 1300  Patient tolerated well.  Transported back to the room  Alert, without acute distress.  Hand-off given to patient's nurse.   Access used: catheter Access issues: none  Total UF removed: 6 L Medication(s) given: none Post HD VS: 160/90 P 100 R 20 O2 sat 98 % 2 L in use. Post HD weight: 82 kg   [facility]

## 2022-06-19 NOTE — ED Provider Notes (Signed)
[facility] EMERGENCY DEPARTMENT Provider Note   CSN: [id number] Arrival date & time: 06/19/22  0035     History  Chief Complaint  Patient presents with   Shortness of Breath    Richard [name] is a 66 y.o. male.  The history is provided by the patient and medical records.  Shortness of Breath  65 y.o. M with hx of HLP, CHF, HIV/AIDS, ESRD on HD, depression, DM2, GERD, HTN, presenting to the ED with respiratory distress.  Seen in the ED overnight for same, was scheduled for HD this morning but refused once he got to the floor, signed refusal paperwork so was discharged.  Tonight, started getting increasingly SOB-- sats found to be 67% at his facility and started on CPAP with EMS with improvement.  Transitioned to bipap upon arrival, tolerating well.  Denies chest pain, just feels SOB.  Usual dialysis Tue/Thur/Sat.  Home Medications Prior to Admission medications   Medication Sig Start Date End Date Taking? Authorizing Provider  ACCU-CHEK AVIVA PLUS test strip USE 1 strip TO test blood sugar 2 TIMES DAILY TO 3 TIMES DAILY 06/10/19   [name], MD  acetaminophen (TYLENOL) 325 MG tablet Take 650 mg by mouth every 6 (six) hours as needed for moderate pain, fever or mild pain.    [provider]  b complex-vitamin c-folic acid (NEPHRO-VITE) 0.8 MG TABS tablet Take 1 tablet by mouth daily.    [provider]  blood glucose meter kit and supplies KIT Dispense based on patient and insurance preference. Check blood sugar 4 times a day 06/11/18   [name], MD  calcium acetate (PHOSLO) 667 MG capsule Take 2,001 mg by mouth 3 (three) times daily with meals.    [provider]  carvedilol (COREG) 12.5 MG tablet Take 12.5 mg by mouth 2 (two) times daily.    [provider]  cloNIDine (CATAPRES) 0.1 MG tablet Take 1 tablet (0.1 mg total) by mouth 3 (three) times daily. 02/21/21   Hosie Poisson, MD  clopidogrel (PLAVIX) 75  MG tablet Take 1 tablet (75 mg total) by mouth daily. 03/07/20   [name], MD  Darbepoetin Alfa (ARANESP) 60 MCG/0.3ML SOSY injection Inject 0.3 mLs (60 mcg total) into the vein every Saturday with hemodialysis. 01/19/21   Bonnielee Haff, MD  docusate sodium (COLACE) 100 MG capsule Take 100 mg by mouth daily.    [provider]  dolutegravir-lamiVUDine (DOVATO) 50-300 MG tablet Take 1 tablet by mouth daily. 03/19/22   Truman Hayward, MD  doxepin (SINEQUAN) 75 MG capsule Take 75 mg by mouth at bedtime.    [provider]  epoetin alfa-epbx (RETACRIT) 36067 UNIT/ML injection Inject 20,000 Units into the skin every Monday.    [provider]  Ergocalciferol 50 MCG (2000 UT) TABS Take 2,000 Units by mouth daily.    [provider]  hydrALAZINE (APRESOLINE) 10 MG tablet Take 10 mg by mouth 3 (three) times daily.    [provider]  insulin glargine-yfgn (SEMGLEE) 100 UNIT/ML Pen Inject 5 Units into the skin at bedtime.    [provider]  Insulin Pen Needle (SURE COMFORT PEN NEEDLES) 31G X 8 MM MISC Use as directed twice daily with Novolog flex pen 02/10/19   Koberlein, Junell C, MD  levETIRAcetam (KEPPRA) 500 MG tablet Take 500 mg by mouth 2 (two) times daily.    [provider]  Lidocaine 3 % CREA Apply 1 application topically See  admin instructions. Apply 1 times a day on Tuesday, Thursday and Saturday prior to dialysis    [provider]  Misc. Devices (COMMODE 3-IN-1) MISC Use as directed 02/24/20   [name], MD  Misc. Devices (TRANSFER BENCH) MISC Use as directed 02/24/20   Koberlein, [name], MD  ondansetron (ZOFRAN) 4 MG tablet Take 4 mg by mouth every 8 (eight) hours as needed for vomiting or nausea. 12/09/21   [provider]  oxyCODONE-acetaminophen (PERCOCET) 5-325 MG tablet Take 1 tablet by mouth every 6 (six) hours as needed for severe pain. 02/28/22 02/28/23  Schuh, McKenzi P, PA-C   pantoprazole (PROTONIX) 40 MG tablet TAKE 1 TABLET BY MOUTH EVERY DAY 01/23/20   Koberlein, Andris Flurry C, MD  rosuvastatin (CRESTOR) 5 MG tablet Take 5 mg by mouth daily. 12/03/21   [provider]  sildenafil (REVATIO) 20 MG tablet Take 1 tablet (20 mg total) by mouth 3 (three) times daily as needed. 03/13/22   Kennyth Arnold, FNP  sulfamethoxazole-trimethoprim (BACTRIM DS) 800-160 MG tablet Take 1 tablet by mouth 3 (three) times a week. Patient taking differently: Take 1 tablet by mouth every Monday, Wednesday, and Friday. Once daily on M-W F continuous 04/23/20   Little Ishikawa, MD  tamsulosin (FLOMAX) 0.4 MG CAPS capsule TAKE 1 CAPSULE BY MOUTH EVERY DAY 03/28/20   Koberlein, [name], MD  traMADol (ULTRAM) 50 MG tablet Take 50 mg by mouth every 8 (eight) hours as needed for moderate pain. 05/01/21   [provider]  TRUEPLUS INSULIN SYRINGE 31G X 5/16" 1 ML MISC Use pen needle with insulin 2 times daily 03/31/19   [name], MD      Allergies    Oxycodone    Review of Systems   Review of Systems  Respiratory:  Positive for shortness of breath.   All other systems reviewed and are negative.   Physical Exam Updated Vital Signs BP (!) 220/100   Pulse (!) 105   Temp (!) 97.4 F (36.3 C) (Oral)   Resp (!) 21   Ht 6' (1.829 m)   Wt 88 kg   SpO2 96%   BMI 26.31 kg/m   Physical Exam Vitals and nursing note reviewed.  Constitutional:      Appearance: He is well-developed.  HENT:     Head: Normocephalic and atraumatic.  Eyes:     Conjunctiva/sclera: Conjunctivae normal.     Pupils: Pupils are equal, round, and reactive to light.  Cardiovascular:     Rate and Rhythm: Normal rate and regular rhythm.     Heart sounds: Normal heart sounds.  Pulmonary:     Effort: Prolonged expiration and retractions present.     Comments: Mildly distressed, retractions with accessory muscle use, able to speak in short phrases Chest:     Comments: Dialysis catheter right  chest wall, appears overall clean Abdominal:     General: Bowel sounds are normal.     Palpations: Abdomen is soft.  Musculoskeletal:        General: Normal range of motion.     Cervical back: Normal range of motion.  Skin:    General: Skin is warm and dry.  Neurological:     Mental Status: He is alert and oriented to person, place, and time.     Comments: Chronic right sided deficits, unchanged from baseline     ED Results / Procedures / Treatments   Labs (all labs ordered are listed, but only abnormal results  are displayed) Labs Reviewed  CBC WITH DIFFERENTIAL/PLATELET - Abnormal; Notable for the following components:      Result Value   WBC 11.4 (*)    RBC 2.89 (*)    Hemoglobin 9.0 (*)    HCT 28.5 (*)    RDW 20.0 (*)    Neutro Abs 9.8 (*)    Lymphs Abs 0.6 (*)    All other components within normal limits  BASIC METABOLIC PANEL - Abnormal; Notable for the following components:   CO2 15 (*)    Glucose, Bld 157 (*)    BUN 63 (*)    Creatinine, Ser 13.35 (*)    Calcium 8.4 (*)    GFR, Estimated 4 (*)    Anion gap 19 (*)    All other components within normal limits  I-STAT ARTERIAL BLOOD GAS, ED    EKG EKG Interpretation  Date/Time:  Thursday June 19 2022 00:41:50 EDT Ventricular Rate:  111 PR Interval:  154 QRS Duration: 91 QT Interval:  368 QTC Calculation: 501 R Axis:   49 Text Interpretation: Sinus tachycardia Probable left atrial enlargement Left ventricular hypertrophy Anterior infarct, old Prolonged QT interval Confirmed by Ripley Fraise [phone]) on 06/19/2022 12:43:33 AM  Radiology DG Chest 2 View  Result Date: 06/18/2022 CLINICAL DATA:  Shortness of breath. EXAM: CHEST - 2 VIEW COMPARISON:  12/19/2021. FINDINGS: The heart is enlarged and the mediastinal contour is stable. Atherosclerotic calcification of the aorta is noted. The pulmonary vasculature is distended. Interstitial prominence is noted bilaterally. There is focal airspace disease in the  mid left lung. No effusion or pneumothorax. No acute osseous abnormality. A right internal jugular central venous catheter terminates over the right atrium. IMPRESSION: 1. Cardiomegaly with pulmonary vascular congestion. 2. Interstitial prominence bilaterally and focal airspace disease in the mid left lung, possible edema or infiltrate. Electronically Signed   By: Brett Fairy M.D.   On: 06/18/2022 00:10   VAS US DUPLEX DIALYSIS ACCESS (AVF,AVG)  Result Date: 06/17/2022 DIALYSIS ACCESS Patient Name:  LYALL FACIANE  Date of Exam:   06/17/2022 Medical Rec #: [id number]              Accession #:    [id number] Date of Birth: 11/10/1955              Patient Gender: M Patient Age:   73 years Exam Location:  [facility] Procedure:      VAS US DUPLEX DIALYSIS ACCESS (AVF, AVG) Referring Phys: Monica Martinez --------------------------------------------------------------------------------  Reason for Exam: Routine follow up. Access Site: Left Upper Extremity. Access Type: Upper arm straight graft. History: Inserted on 02/28/22. Failed left BC AVF.          Large lumps at [facility] and axilla. Performing Technologist: Ralene Cork RVT  Examination Guidelines: A complete evaluation includes B-mode imaging, spectral Doppler, color Doppler, and power Doppler as needed of all accessible portions of each vessel. Unilateral testing is considered an integral part of a complete examination. Limited examinations for reoccurring indications may be performed as noted.  Findings:   +--------------------+----------+-----------------+----------------------+ AVG                 PSV (cm/s)Flow Vol (mL/min)       Describe        +--------------------+----------+-----------------+----------------------+ Native artery inflow   238          1902                              +--------------------+----------+-----------------+----------------------+  Arterial anastomosis   267                             hematoma        +--------------------+----------+-----------------+----------------------+ Prox graft             608                     compressed by hematoma +--------------------+----------+-----------------+----------------------+ Mid graft              172                                            +--------------------+----------+-----------------+----------------------+ Distal graft           202                            hematoma        +--------------------+----------+-----------------+----------------------+ Venous anastomosis     338                            hematoma        +--------------------+----------+-----------------+----------------------+ Venous outflow         233                                            +--------------------+----------+-----------------+----------------------+  Summary: Patent AVG with no thrombus seen. Large hematomas at the antecubitum and the axilla.  *See table(s) above for measurements and observations.  Diagnosing physician: Monica Martinez MD Electronically signed by Monica Martinez MD on 06/17/2022 at 8:53:56 AM.    --------------------------------------------------------------------------------   Final     Procedures Procedures    CRITICAL CARE Performed by: Larene Pickett   Total critical care time: 45 minutes  Critical care time was exclusive of separately billable procedures and treating other patients.  Critical care was necessary to treat or prevent imminent or life-threatening deterioration.  Critical care was time spent personally by me on the following activities: development of treatment plan with patient and/or surrogate as well as nursing, discussions with consultants, evaluation of patient's response to treatment, examination of patient, obtaining history from patient or surrogate, ordering and performing treatments and interventions, ordering and review of laboratory studies, ordering and review of  radiographic studies, pulse oximetry and re-evaluation of patient's condition.   Medications Ordered in ED Medications - No data to display  ED Course/ Medical Decision Making/ A&P                           Medical Decision Making Amount and/or Complexity of Data Reviewed Labs: ordered. Radiology: ordered and independent interpretation performed. ECG/medicine tests: ordered and independent interpretation performed.  Risk OTC drugs.   66 y.o. M here with respiratory distress.  Seen in the ED yesterday for same, set up for dialysis but refused treatment and was discharged.  EMS reports sats of 67% on RA upon their arrival tonight at his SNF.  Patient is retracting with accessory muscle use on exam.  Transitioned to bipap and tolerating well.  Will check labs, CXR, EKG.  1:29 AM Patient once again  being obstructive with care.  He is refusing to let RN attempt IV access or obtain blood work.  He states "I will just get dialysis tomorrow".  Bluntly made patient aware that refusing dialysis yesterday is why he is back in the ED today.  As he is currently on bipap, he is not stable to be discharged for OP dialysis.  Patient made aware that we need blood work in order to get dialysis arranged.  IV team consulted.  He is unhappy about this but ultimately agreeable.  Labs were ultimately obtained by IV team-- normal K+.  CXR consistent with volume overload.  Remains stable on bipap.    Spoke with nephrology, Dr. Posey Pronto-- will place orders for HD this am.  Can likely be discharged afterwards.  Final Clinical Impression(s) / ED Diagnoses Final diagnoses:  Other hypervolemia  Noncompliance    Rx / DC Orders ED Discharge Orders     None         Larene Pickett, PA-C 06/19/22 0500    Ripley Fraise, MD 06/20/22 [phone]

## 2022-06-19 NOTE — ED Notes (Signed)
Respiratory at bedside.

## 2022-06-19 NOTE — ED Notes (Signed)
IV team at bedside 

## 2022-06-19 NOTE — ED Notes (Signed)
Pt served dinner tray

## 2022-06-19 NOTE — ED Provider Notes (Signed)
Patient here after receiving a full treatment of dialysis.  He is ESRD on dialysis Tuesday, Thursday, Saturday.  Return to the emergency department after dialysis reports improvement in all his symptoms.  He is not experiencing any shortness of breath.  He was wearing 2 L nasal cannula prior to dialysis however this was discontinued by me and he is satting at 100% on room air without any signs of respiratory distress or tachypnea. And is currently living in a facility  Physical Exam Vitals and nursing note reviewed.  Constitutional:      Appearance: He is well-developed. He is not ill-appearing.  HENT:     Head: Normocephalic.  Cardiovascular:     Rate and Rhythm: Normal rate.  Pulmonary:     Effort: Pulmonary effort is normal.     Breath sounds: No decreased breath sounds or wheezing.  Chest:     Chest wall: No tenderness.  Musculoskeletal:     Cervical back: Normal range of motion and neck supple.  Skin:    General: Skin is warm and dry.  Neurological:     Mental Status: He is alert and oriented to person, place, and time.     Motor: No weakness.    Patient is stable for discharge, CBG was checked and is now 89, but it with oral intake.  Ordered a hot meal for him as well.  PTOT will be called in order patient to be transported to facility. BP (!) 193/82   Pulse 98   Temp 98.6 F (37 C) (Oral)   Resp 16   Ht 6' (1.829 m)   Wt 88 kg   SpO2 97%   BMI 26.31 kg/m   Portions of this note were generated with Lobby[employer]. Dictation errors may occur despite best attempts at proofreading.     Janeece Fitting, PA-C 06/19/22 [place], [place], DO 06/19/22 2040

## 2022-06-21 LAB — HEPATITIS B SURFACE ANTIBODY, QUANTITATIVE: Hep B S AB Quant (Post): <3.1 m[IU]/mL — ABNORMAL LOW (ref >9.9)

## 2022-06-24 ENCOUNTER — Encounter (HOSPITAL_COMMUNITY): Payer: Self-pay

## 2022-07-02 ENCOUNTER — Encounter (HOSPITAL_COMMUNITY): Payer: Self-pay

## 2022-07-02 ENCOUNTER — Emergency Department (HOSPITAL_COMMUNITY)
Admission: EM | Admit: 2022-07-02 | Discharge: 2022-07-03 | Disposition: A | Discharge status: Home / Self Care | Payer: Medicare HMO | Attending: Emergency Medicine | Admitting: Emergency Medicine

## 2022-07-02 DIAGNOSIS — R000 Tachycardia, unspecified: Secondary | ICD-10-CM | POA: Diagnosis not present

## 2022-07-02 DIAGNOSIS — Z992 Dependence on renal dialysis: Secondary | ICD-10-CM | POA: Insufficient documentation

## 2022-07-02 DIAGNOSIS — R0602 Shortness of breath: Secondary | ICD-10-CM | POA: Insufficient documentation

## 2022-07-02 DIAGNOSIS — I10 Essential (primary) hypertension: Secondary | ICD-10-CM | POA: Diagnosis not present

## 2022-07-02 DIAGNOSIS — G8191 Hemiplegia, unspecified affecting right dominant side: Secondary | ICD-10-CM | POA: Diagnosis not present

## 2022-07-02 DIAGNOSIS — R61 Generalized hyperhidrosis: Secondary | ICD-10-CM | POA: Diagnosis not present

## 2022-07-02 DIAGNOSIS — Z7902 Long term (current) use of antithrombotics/antiplatelets: Secondary | ICD-10-CM | POA: Diagnosis not present

## 2022-07-02 DIAGNOSIS — Z79899 Other long term (current) drug therapy: Secondary | ICD-10-CM | POA: Insufficient documentation

## 2022-07-02 NOTE — ED Triage Notes (Signed)
Pt BIB [facility] from [facility] for shob. Pt was 81% RA, EMS put CPAP with improvement of O2 100%. Wheezing throughout fields. HD TThS and denies missing any. BP 240/148 HX: CVA

## 2022-07-03 ENCOUNTER — Other Ambulatory Visit: Payer: Self-pay

## 2022-07-03 ENCOUNTER — Encounter (HOSPITAL_COMMUNITY): Payer: Self-pay

## 2022-07-03 ENCOUNTER — Emergency Department (HOSPITAL_COMMUNITY): Payer: Medicare HMO

## 2022-07-03 DIAGNOSIS — R0602 Shortness of breath: Secondary | ICD-10-CM | POA: Diagnosis not present

## 2022-07-03 LAB — CBC WITH DIFFERENTIAL/PLATELET
Abs Immature Granulocytes: 0.05 10*3/uL (ref 0.00–0.07)
Basophils Absolute: 0 10*3/uL (ref 0.0–0.1)
Basophils Relative: 0 %
Eosinophils Absolute: 0.3 10*3/uL (ref 0.0–0.5)
Eosinophils Relative: 2 %
HCT: 28.4 % — ABNORMAL LOW (ref 39.0–52.0)
Hemoglobin: 9.6 g/dL — ABNORMAL LOW (ref 13.0–17.0)
Immature Granulocytes: 0 %
Lymphocytes Relative: 10 %
Lymphs Abs: 1.1 10*3/uL (ref 0.7–4.0)
MCH: 32.2 pg (ref 26.0–34.0)
MCHC: 33.8 g/dL (ref 30.0–36.0)
MCV: 95.3 fL (ref 80.0–100.0)
Monocytes Absolute: 0.8 10*3/uL (ref 0.1–1.0)
Monocytes Relative: 7 %
Neutro Abs: 8.9 10*3/uL — ABNORMAL HIGH (ref 1.7–7.7)
Neutrophils Relative %: 81 %
Platelets: 184 10*3/uL (ref 150–400)
RBC: 2.98 MIL/uL — ABNORMAL LOW (ref 4.22–5.81)
RDW: 17.8 % — ABNORMAL HIGH (ref 11.5–15.5)
WBC: 11.1 10*3/uL — ABNORMAL HIGH (ref 4.0–10.5)
nRBC: 0 % (ref 0.0–0.2)

## 2022-07-03 LAB — COMPREHENSIVE METABOLIC PANEL
ALT: 11 U/L (ref 0–44)
AST: 15 U/L (ref 15–41)
Albumin: 2.8 g/dL — ABNORMAL LOW (ref 3.5–5.0)
Alkaline Phosphatase: 92 U/L (ref 38–126)
Anion gap: 17 — ABNORMAL HIGH (ref 5–15)
BUN: 52 mg/dL — ABNORMAL HIGH (ref 8–23)
CO2: 17 mmol/L — ABNORMAL LOW (ref 22–32)
Calcium: 7.9 mg/dL — ABNORMAL LOW (ref 8.9–10.3)
Chloride: 106 mmol/L (ref 98–111)
Creatinine, Ser: 12.71 mg/dL — ABNORMAL HIGH (ref 0.61–1.24)
GFR, Estimated: 4 mL/min — ABNORMAL LOW (ref >60)
Glucose, Bld: 124 mg/dL — ABNORMAL HIGH (ref 70–99)
Potassium: 4.4 mmol/L (ref 3.5–5.1)
Sodium: 140 mmol/L (ref 135–145)
Total Bilirubin: 0.4 mg/dL (ref 0.3–1.2)
Total Protein: 6.4 g/dL — ABNORMAL LOW (ref 6.5–8.1)

## 2022-07-03 LAB — I-STAT CHEM 8, ED
BUN: 47 mg/dL — ABNORMAL HIGH (ref 8–23)
Calcium, Ion: 0.87 mmol/L — CL (ref 1.15–1.40)
Chloride: 110 mmol/L (ref 98–111)
Creatinine, Ser: 13.9 mg/dL — ABNORMAL HIGH (ref 0.61–1.24)
Glucose, Bld: 117 mg/dL — ABNORMAL HIGH (ref 70–99)
HCT: 30 % — ABNORMAL LOW (ref 39.0–52.0)
Hemoglobin: 10.2 g/dL — ABNORMAL LOW (ref 13.0–17.0)
Potassium: 4.2 mmol/L (ref 3.5–5.1)
Sodium: 139 mmol/L (ref 135–145)
TCO2: 17 mmol/L — ABNORMAL LOW (ref 22–32)

## 2022-07-03 LAB — TROPONIN I (HIGH SENSITIVITY): Troponin I (High Sensitivity): 51 ng/L — ABNORMAL HIGH (ref <18)

## 2022-07-03 NOTE — ED Notes (Signed)
Two RN attempted for blood draw, unsuccessful. IV consult ordered.

## 2022-07-03 NOTE — ED Notes (Signed)
Per IV team, unsuccessful for blood draw.

## 2022-07-03 NOTE — Progress Notes (Signed)
Patient weaned off BiPAP per MD.  Patient placed on 2L [place]. VSS.  RT will continue to monitor.

## 2022-07-03 NOTE — Progress Notes (Signed)
RT unable to obtain ABG.  Patient kept jerking left arm saying it was going to hurt.  Venous gas may be good alternative.  Patient satting 100% on BiPAP set at 40% FiO2.  Patient alert and oriented.  RT will continue to monitor.

## 2022-07-03 NOTE — ED Provider Notes (Signed)
[facility] EMERGENCY DEPARTMENT Provider Note   CSN: [id number] Arrival date & time: 07/02/22  2350     History  Chief Complaint  Patient presents with   Respiratory Distress    Richard [name] is a 66 y.o. male.  Patient presents to the emergency department for shortness of breath.  Patient comes to the ER by ambulance from nursing home.  Patient reports progressively worsening shortness of breath tonight.  EMS report the patient was in severe distress, diaphoretic with room air oxygen saturation of 81%.  He does not normally use oxygen.  Patient was put on CPAP for transport with improvement.  He was noted to have wheezing throughout, administered albuterol and Atrovent during transport.  Patient denies any chest pain.  Patient very hypertensive during transport.  He is a dialysis patient, denies missing dialysis, due for dialysis later today.       Home Medications Prior to Admission medications   Medication Sig Start Date End Date Taking? Authorizing Provider  calcium acetate (PHOSLO) 667 MG capsule Take 2,001 mg by mouth 3 (three) times daily with meals.   Yes [provider]  carvedilol (COREG) 12.5 MG tablet Take 12.5 mg by mouth 2 (two) times daily.   Yes [provider]  cloNIDine (CATAPRES) 0.1 MG tablet Take 1 tablet (0.1 mg total) by mouth 3 (three) times daily. 02/21/21  Yes Hosie Poisson, MD  doxepin (SINEQUAN) 75 MG capsule Take 75 mg by mouth at bedtime.   Yes [provider]  hydrALAZINE (APRESOLINE) 10 MG tablet Take 10 mg by mouth 3 (three) times daily.   Yes [provider]  hydrocortisone cream 1 % Apply 1 Application topically every 6 (six) hours as needed for itching. Apply to affected areas on back arms   Yes [provider]  insulin glargine-yfgn (SEMGLEE) 100 UNIT/ML Pen Inject 5 Units into the skin at bedtime.   Yes [provider]  levETIRAcetam (KEPPRA) 500 MG tablet Take 500 mg  by mouth 2 (two) times daily.   Yes [provider]  ondansetron (ZOFRAN) 4 MG tablet Take 4 mg by mouth every 8 (eight) hours as needed for vomiting or nausea. 12/09/21  Yes [provider]  traMADol (ULTRAM) 50 MG tablet Take 50 mg by mouth every 8 (eight) hours as needed for moderate pain. 05/01/21  Yes [provider]  ACCU-CHEK AVIVA PLUS test strip USE 1 strip TO test blood sugar 2 TIMES DAILY TO 3 TIMES DAILY 06/10/19   [name], MD  acetaminophen (TYLENOL) 325 MG tablet Take 650 mg by mouth every 6 (six) hours as needed for moderate pain, fever or mild pain.    [provider]  b complex-vitamin c-folic acid (NEPHRO-VITE) 0.8 MG TABS tablet Take 1 tablet by mouth daily.    [provider]  blood glucose meter kit and supplies KIT Dispense based on patient and insurance preference. Check blood sugar 4 times a day 06/11/18   [name], MD  clopidogrel (PLAVIX) 75 MG tablet Take 1 tablet (75 mg total) by mouth daily. 03/07/20   [name], MD  Darbepoetin Alfa (ARANESP) 60 MCG/0.3ML SOSY injection Inject 0.3 mLs (60 mcg total) into the vein every Saturday with hemodialysis. Patient not taking: Reported on 06/19/2022 01/19/21   Bonnielee Haff, MD  docusate sodium (COLACE) 100 MG capsule Take 100 mg by mouth daily.    [provider]  dolutegravir-lamiVUDine (DOVATO) 50-300 MG tablet Take 1 tablet  by mouth daily. 03/19/22   Truman Hayward, MD  epoetin alfa-epbx (RETACRIT) 42353 UNIT/ML injection Inject 20,000 Units into the skin every Monday.    [provider]  Ergocalciferol 50 MCG (2000 UT) TABS Take 2,000 Units by mouth daily.    [provider]  Insulin Pen Needle (SURE COMFORT PEN NEEDLES) 31G X 8 MM MISC Use as directed twice daily with Novolog flex pen 02/10/19   Koberlein, Junell C, MD  Lidocaine 3 % CREA Apply 1 application topically See admin instructions. Apply 1 times a day on Tuesday,  Thursday and Saturday prior to dialysis    [provider]  Misc. Devices (COMMODE 3-IN-1) MISC Use as directed 02/24/20   [name], MD  Misc. Devices (TRANSFER BENCH) MISC Use as directed 02/24/20   Koberlein, [name], MD  oxyCODONE-acetaminophen (PERCOCET) 5-325 MG tablet Take 1 tablet by mouth every 6 (six) hours as needed for severe pain. Patient not taking: Reported on 06/19/2022 02/28/22 02/28/23  Vicente Serene P, PA-C  pantoprazole (PROTONIX) 40 MG tablet TAKE 1 TABLET BY MOUTH EVERY DAY Patient taking differently: Take 40 mg by mouth daily. 01/23/20   Koberlein, [name], MD  pramoxine-hydrocortisone 1-1 % lotion Apply topically 3 (three) times daily.    [provider]  rosuvastatin (CRESTOR) 5 MG tablet Take 5 mg by mouth daily. 12/03/21   [provider]  sildenafil (REVATIO) 20 MG tablet Take 1 tablet (20 mg total) by mouth 3 (three) times daily as needed. Patient not taking: Reported on 06/19/2022 03/13/22   Dutch Quint B, FNP  sulfamethoxazole-trimethoprim (BACTRIM DS) 800-160 MG tablet Take 1 tablet by mouth 3 (three) times a week. Patient taking differently: Take 1 tablet by mouth every Monday, Wednesday, and Friday. Once daily on M-W F continuous 04/23/20   Little Ishikawa, MD  tamsulosin (FLOMAX) 0.4 MG CAPS capsule TAKE 1 CAPSULE BY MOUTH EVERY DAY Patient taking differently: Take 0.4 mg by mouth daily. 03/28/20   [name], MD  TRUEPLUS INSULIN SYRINGE 31G X 5/16" 1 ML MISC Use pen needle with insulin 2 times daily 03/31/19   [name], MD      Allergies    Oxycodone    Review of Systems   Review of Systems  Physical Exam Updated Vital Signs BP (!) 152/66   Pulse 92   Temp 97.8 F (36.6 C) (Oral)   Resp 17   Ht 6' (1.829 m)   Wt 84.4 kg   SpO2 96%   BMI 25.23 kg/m  Physical Exam Vitals and nursing note reviewed.  Constitutional:      General: He is not in acute distress.    Appearance: He is well-developed.   HENT:     Head: Normocephalic and atraumatic.     Mouth/Throat:     Mouth: Mucous membranes are moist.  Eyes:     General: Vision grossly intact. Gaze aligned appropriately.     Extraocular Movements: Extraocular movements intact.     Conjunctiva/sclera: Conjunctivae normal.  Cardiovascular:     Rate and Rhythm: Regular rhythm. Tachycardia present.     Pulses: Normal pulses.     Heart sounds: Normal heart sounds, S1 normal and S2 normal. No murmur heard.    No friction rub. No gallop.  Pulmonary:     Effort: Tachypnea, accessory muscle usage and respiratory distress present.     Breath sounds: Decreased breath sounds present.  Abdominal:     Palpations: Abdomen is  soft.     Tenderness: There is no abdominal tenderness. There is no guarding or rebound.     Hernia: No hernia is present.  Musculoskeletal:        General: No swelling.     Cervical back: Full passive range of motion without pain, normal range of motion and neck supple. No pain with movement, spinous process tenderness or muscular tenderness. Normal range of motion.     Right lower leg: No edema.     Left lower leg: No edema.  Skin:    General: Skin is warm and dry.     Capillary Refill: Capillary refill takes less than 2 seconds.     Findings: No ecchymosis, erythema, lesion or wound.  Neurological:     Mental Status: He is alert and oriented to person, place, and time.     GCS: GCS eye subscore is 4. GCS verbal subscore is 5. GCS motor subscore is 6.     Motor: No weakness or abnormal muscle tone.     Comments: Right hemiparesis   Psychiatric:        Mood and Affect: Mood normal.        Speech: Speech normal.        Behavior: Behavior normal.     ED Results / Procedures / Treatments   Labs (all labs ordered are listed, but only abnormal results are displayed) Labs Reviewed  CBC WITH DIFFERENTIAL/PLATELET - Abnormal; Notable for the following components:      Result Value   WBC 11.1 (*)    RBC 2.98 (*)     Hemoglobin 9.6 (*)    HCT 28.4 (*)    RDW 17.8 (*)    Neutro Abs 8.9 (*)    All other components within normal limits  COMPREHENSIVE METABOLIC PANEL - Abnormal; Notable for the following components:   CO2 17 (*)    Glucose, Bld 124 (*)    BUN 52 (*)    Creatinine, Ser 12.71 (*)    Calcium 7.9 (*)    Total Protein 6.4 (*)    Albumin 2.8 (*)    GFR, Estimated 4 (*)    Anion gap 17 (*)    All other components within normal limits  I-STAT CHEM 8, ED - Abnormal; Notable for the following components:   BUN 47 (*)    Creatinine, Ser 13.90 (*)    Glucose, Bld 117 (*)    Calcium, Ion 0.87 (*)    TCO2 17 (*)    Hemoglobin 10.2 (*)    HCT 30.0 (*)    All other components within normal limits  TROPONIN I (HIGH SENSITIVITY) - Abnormal; Notable for the following components:   Troponin I (High Sensitivity) 51 (*)    All other components within normal limits  TROPONIN I (HIGH SENSITIVITY)    EKG EKG Interpretation  Date/Time:  Thursday July 03 2022 00:02:28 EDT Ventricular Rate:  111 PR Interval:  163 QRS Duration: 92 QT Interval:  349 QTC Calculation: 475 R Axis:   69 Text Interpretation: Sinus tachycardia Ventricular premature complex Probable LVH with secondary repol abnrm Anterior Q waves, possibly due to LVH Confirmed by Orpah Greek [phone]) on 07/03/2022 12:29:26 AM  Radiology DG Chest Portable 1 View  Result Date: 07/03/2022 CLINICAL DATA:  Respiratory distress. EXAM: PORTABLE CHEST 1 VIEW COMPARISON:  06/19/2022. FINDINGS: The heart is enlarged the mediastinal contour is stable. The pulmonary vasculature is stable. There is chronic elevation of the right diaphragm. No consolidation,  effusion, or pneumothorax. A stable right internal jugular catheter terminates in the right atrium. IMPRESSION: Cardiomegaly with no acute process. Electronically Signed   By: Brett Fairy M.D.   On: 07/03/2022 00:50    Procedures Procedures    Medications Ordered in  ED Medications - No data to display  ED Course/ Medical Decision Making/ A&P                           Medical Decision Making Amount and/or Complexity of Data Reviewed Labs: ordered. Radiology: ordered.   Patient is end-stage renal disease on hemodialysis.  He presents with shortness of breath.  Patient was markedly hypertensive and hypoxic upon arrival of EMS.  He was still dyspneic when he was brought to the ED.  There was some slight upper airway wheezes noted but his history is that he has heart failure with his renal disease.  There was felt that he would likely had some pulmonary edema.  Chest x-ray, however, was unchanged from prior, no significant edema noted.  Patient placed on BiPAP for a period of time.  His vital signs became much improved, blood pressure significantly improved.  Patient comfortable.  He was weaned to nasal cannula and then room air without difficulty.  Patient's cardiac evaluation was unremarkable.  No EKG changes.  Troponins at baseline.  No sign of ACS.  He is not experiencing associated chest pain.  Electrolytes are unremarkable.  No need for emergent dialysis.  He is due for dialysis later this morning at his normal site.  Will be discharged back to the nursing home to have dialysis at his center later today.        Final Clinical Impression(s) / ED Diagnoses Final diagnoses:  Shortness of breath    Rx / DC Orders ED Discharge Orders     None         Jordie Skalsky, Gwenyth Allegra, MD 07/04/22 [phone]

## 2022-07-03 NOTE — ED Notes (Signed)
Ptar called 

## 2022-07-03 NOTE — ED Notes (Signed)
Patient verbalizes understanding of d/c instructions. Opportunities for questions and answers were provided. Pt d/c from ED and transferred back to facility via [place].

## 2022-07-07 IMAGING — US IR FLUORO GUIDE CV LINE*R*
1 series · 2 of 2 positions shown · non-contrast
Comparison: none

INDICATION: 65-year-old male with end-stage renal disease on hemodialysis. He
requires venous access for hemodialysis.

[Series 1: ir fluoro guide cv line*right* · 2 of 2 slices shown]
[im 1/2]
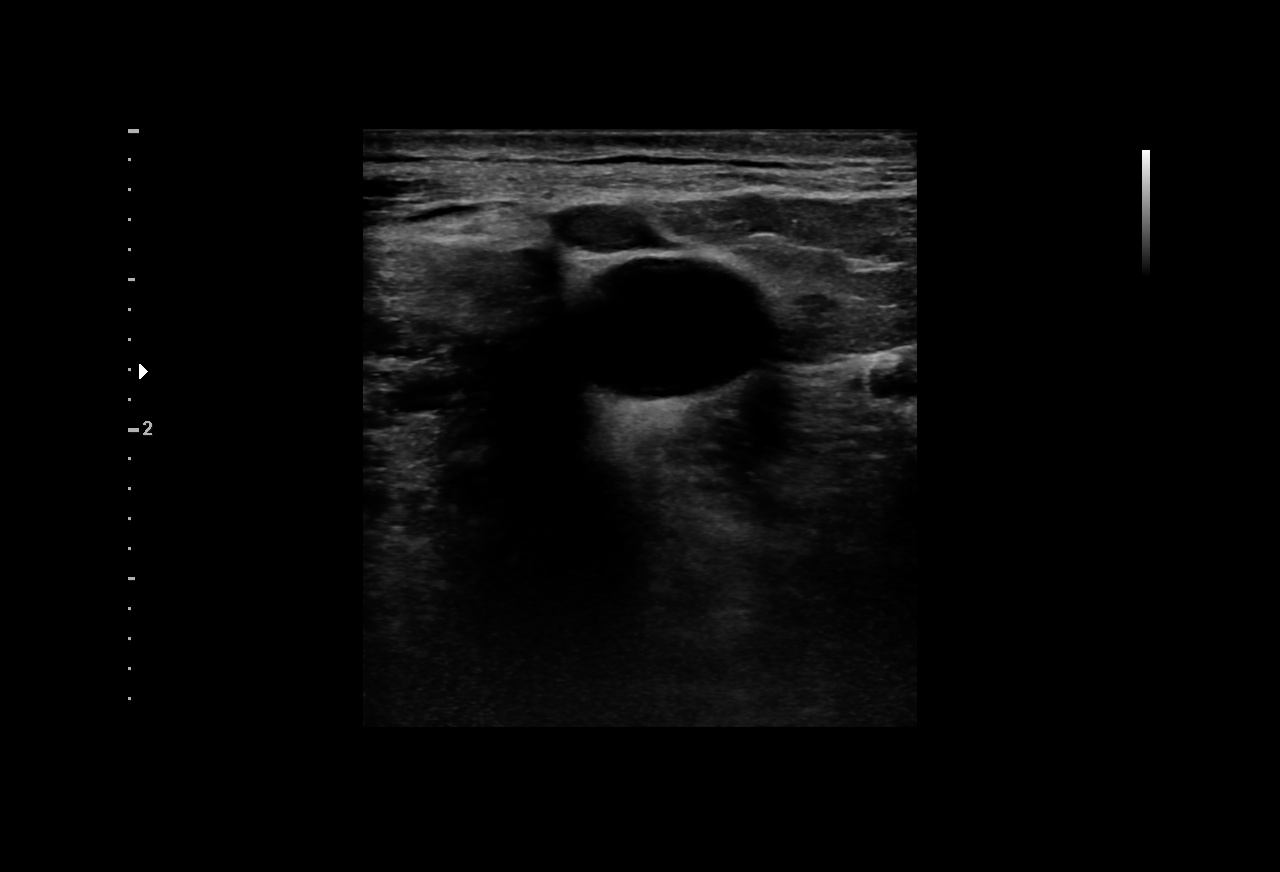
[im 2/2]
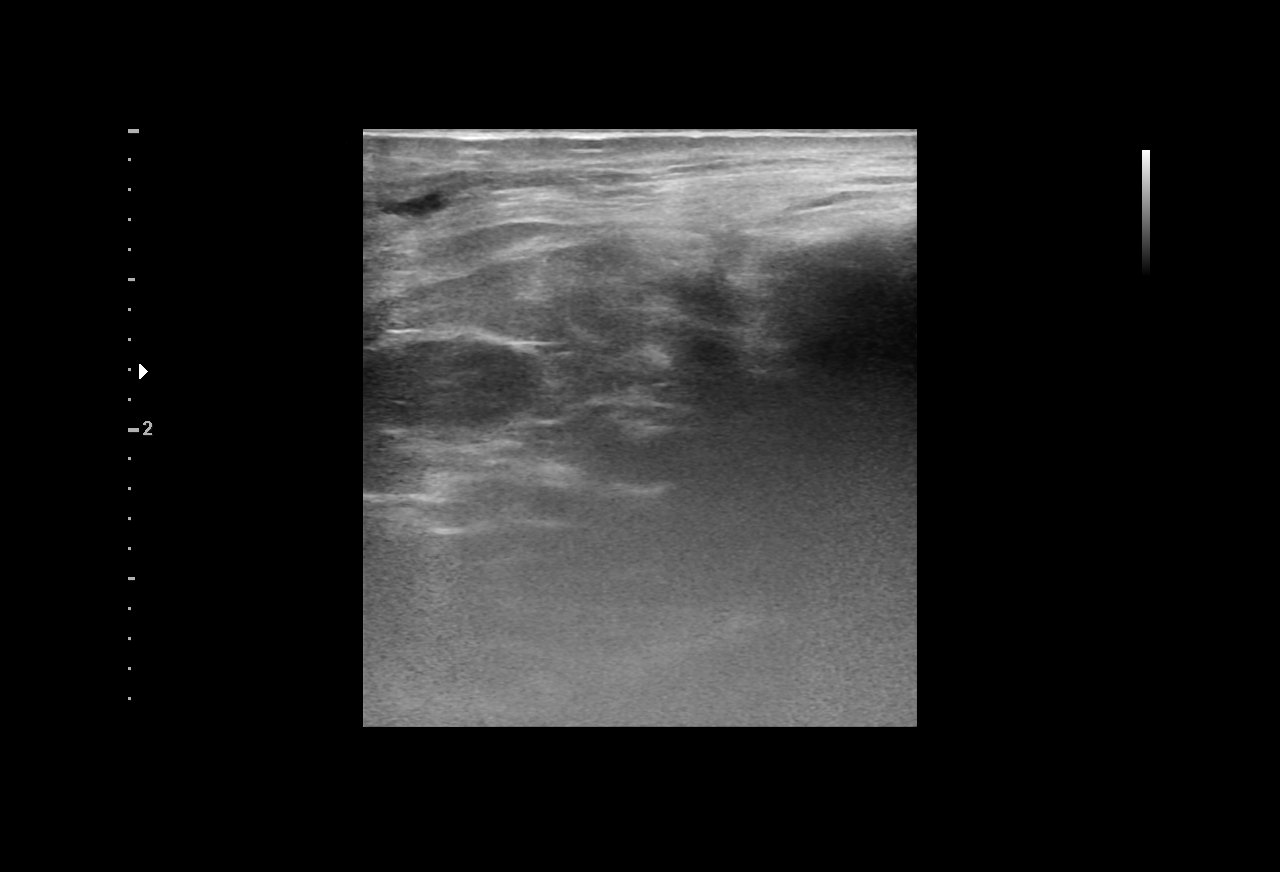

[2 of 2 positions shown; findings below may reference images not displayed]

EXAM:
TUNNELED CENTRAL VENOUS HEMODIALYSIS CATHETER PLACEMENT WITH
ULTRASOUND AND FLUOROSCOPIC GUIDANCE

MEDICATIONS:
2 g Ancef. The antibiotic was given in an appropriate time interval
prior to skin puncture.

ANESTHESIA/SEDATION:
Moderate (conscious) sedation was employed during this procedure. A
total of Versed 1.5 mg and Fentanyl 75 mcg was administered
intravenously.

Moderate Sedation Time: 19 minutes. The patient's level of
consciousness and vital signs were monitored continuously by
radiology nursing throughout the procedure under my direct
supervision.

FLUOROSCOPY TIME:  Radiation exposure index: 2 mGy reference air
kerma

COMPLICATIONS:
None immediate.



The right internal jugular vein was assessed with ultrasound
guidance and found to be chronically occluded. Ultrasound does
demonstrate patency of the subclavian vein at its confluence with
the internal jugular vein. An image was obtained and stored for the
medical record. Under real-time ultrasound guidance, a 21 gauge
micropuncture needle was carefully advanced into the subclavian vein
at this location. A 0.018 wire was advanced into the right heart. A
0.035 J wire was advanced to the level of the IVC and the
micropuncture sheath was exchanged for a peel-away sheath. A
palindrome tunneled hemodialysis catheter measuring 23 cm from tip
to cuff was tunneled in a retrograde fashion from the anterior chest
wall to the venotomy incision.

The catheter was then placed through the peel-away sheath with tips
ultimately positioned within the superior aspect of the right
atrium. Final catheter positioning was confirmed and documented with
a spot radiographic image. The catheter aspirates and flushes
normally. The catheter was flushed with appropriate volume heparin
dwells.

The catheter exit site was secured with a 0-Prolene retention
suture. The venotomy incision was closed with Dermabond. Dressings
were applied. The patient tolerated the procedure well without
immediate post procedural complication.
IMPRESSION: Successful placement of 23 cm tip to cuff tunneled hemodialysis
catheter via the right subclavian vein with tips terminating within
the superior aspect of the right atrium. The catheter is ready for
immediate use.

Chronic occlusion of the right internal jugular vein.

## 2022-07-18 ENCOUNTER — Encounter (HOSPITAL_COMMUNITY): Payer: Self-pay

## 2022-07-22 ENCOUNTER — Emergency Department (HOSPITAL_COMMUNITY): Payer: No Typology Code available for payment source

## 2022-07-22 ENCOUNTER — Encounter (HOSPITAL_COMMUNITY): Payer: Self-pay

## 2022-07-22 ENCOUNTER — Emergency Department (HOSPITAL_COMMUNITY)
Admission: EM | Admit: 2022-07-22 | Discharge: 2022-07-23 | Disposition: A | Discharge status: Home / Self Care | Payer: No Typology Code available for payment source | Attending: Emergency Medicine | Admitting: Emergency Medicine

## 2022-07-22 DIAGNOSIS — Z992 Dependence on renal dialysis: Secondary | ICD-10-CM | POA: Insufficient documentation

## 2022-07-22 DIAGNOSIS — N186 End stage renal disease: Secondary | ICD-10-CM | POA: Diagnosis not present

## 2022-07-22 DIAGNOSIS — R4182 Altered mental status, unspecified: Secondary | ICD-10-CM | POA: Diagnosis present

## 2022-07-22 DIAGNOSIS — R404 Transient alteration of awareness: Secondary | ICD-10-CM | POA: Diagnosis not present

## 2022-07-22 DIAGNOSIS — R410 Disorientation, unspecified: Secondary | ICD-10-CM

## 2022-07-22 DIAGNOSIS — Z794 Long term (current) use of insulin: Secondary | ICD-10-CM | POA: Diagnosis not present

## 2022-07-22 LAB — CBG MONITORING, ED: Glucose-Capillary: 129 mg/dL — ABNORMAL HIGH (ref 70–99)

## 2022-07-22 MED ORDER — NALOXONE HCL 0.4 MG/ML IJ SOLN: 0.4000 mg | Freq: Once | INTRAMUSCULAR | Status: DC

## 2022-07-22 NOTE — Discharge Instructions (Signed)
You were seen in the emergency department today for evaluation of a transient episode of confusion.  You did have low blood sugar here in the emergency department and I wonder if perhaps you had an episode of low blood sugar during dialysis today.  You have returned to normal mental status baseline at this time you are safe for discharge with outpatient follow-up.  Please return the emergency department if these episodes continue to occur, you have any chest pain, shortness of breath or any other concerning symptoms.

## 2022-07-22 NOTE — ED Provider Notes (Signed)
[place] [facility] EMERGENCY DEPARTMENT Provider Note   CSN: [id number] Arrival date & time: 07/22/22  1724     History {Add pertinent medical, surgical, social history, OB history to HPI:1} No chief complaint on file.   Richard [name] is a 66 y.o. male.  HPI     Home Medications Prior to Admission medications   Medication Sig Start Date End Date Taking? Authorizing Provider  ACCU-CHEK AVIVA PLUS test strip USE 1 strip TO test blood sugar 2 TIMES DAILY TO 3 TIMES DAILY 06/10/19   [name], MD  acetaminophen (TYLENOL) 325 MG tablet Take 650 mg by mouth every 6 (six) hours as needed for moderate pain, fever or mild pain.    [provider]  b complex-vitamin c-folic acid (NEPHRO-VITE) 0.8 MG TABS tablet Take 1 tablet by mouth daily.    [provider]  blood glucose meter kit and supplies KIT Dispense based on patient and insurance preference. Check blood sugar 4 times a day 06/11/18   [name], MD  calcium acetate (PHOSLO) 667 MG capsule Take 2,001 mg by mouth 3 (three) times daily with meals.    [provider]  carvedilol (COREG) 12.5 MG tablet Take 12.5 mg by mouth 2 (two) times daily.    [provider]  cloNIDine (CATAPRES) 0.1 MG tablet Take 1 tablet (0.1 mg total) by mouth 3 (three) times daily. 02/21/21   Hosie Poisson, MD  clopidogrel (PLAVIX) 75 MG tablet Take 1 tablet (75 mg total) by mouth daily. 03/07/20   [name], MD  Darbepoetin Alfa (ARANESP) 60 MCG/0.3ML SOSY injection Inject 0.3 mLs (60 mcg total) into the vein every Saturday with hemodialysis. Patient not taking: Reported on 06/19/2022 01/19/21   Bonnielee Haff, MD  docusate sodium (COLACE) 100 MG capsule Take 100 mg by mouth daily.    [provider]  dolutegravir-lamiVUDine (DOVATO) 50-300 MG tablet Take 1 tablet by mouth daily. 03/19/22   Truman Hayward, MD  doxepin (SINEQUAN) 75 MG capsule Take 75 mg by mouth at  bedtime.    [provider]  epoetin alfa-epbx (RETACRIT) 97989 UNIT/ML injection Inject 20,000 Units into the skin every Monday.    [provider]  Ergocalciferol 50 MCG (2000 UT) TABS Take 2,000 Units by mouth daily.    [provider]  hydrALAZINE (APRESOLINE) 10 MG tablet Take 10 mg by mouth 3 (three) times daily.    [provider]  hydrocortisone cream 1 % Apply 1 Application topically every 6 (six) hours as needed for itching. Apply to affected areas on back arms    [provider]  insulin glargine-yfgn (SEMGLEE) 100 UNIT/ML Pen Inject 5 Units into the skin at bedtime.    [provider]  Insulin Pen Needle (SURE COMFORT PEN NEEDLES) 31G X 8 MM MISC Use as directed twice daily with Novolog flex pen 02/10/19   Koberlein, Junell C, MD  levETIRAcetam (KEPPRA) 500 MG tablet Take 500 mg by mouth 2 (two) times daily.    [provider]  Lidocaine 3 % CREA Apply 1 application topically See admin instructions. Apply 1 times a day on Tuesday, Thursday and Saturday prior to dialysis    [provider]  Misc. Devices (COMMODE 3-IN-1) MISC Use as directed 02/24/20   [name], MD  Misc. Devices (TRANSFER BENCH) MISC Use as directed 02/24/20   Koberlein, [name], MD  ondansetron (ZOFRAN) 4 MG tablet Take 4 mg by mouth every  8 (eight) hours as needed for vomiting or nausea. 12/09/21   [provider]  oxyCODONE-acetaminophen (PERCOCET) 5-325 MG tablet Take 1 tablet by mouth every 6 (six) hours as needed for severe pain. Patient not taking: Reported on 06/19/2022 02/28/22 02/28/23  Vicente Serene P, PA-C  pantoprazole (PROTONIX) 40 MG tablet TAKE 1 TABLET BY MOUTH EVERY DAY Patient taking differently: Take 40 mg by mouth daily. 01/23/20   Koberlein, [name], MD  pramoxine-hydrocortisone 1-1 % lotion Apply topically 3 (three) times daily.    [provider]  rosuvastatin (CRESTOR) 5 MG tablet Take 5 mg by mouth daily.  12/03/21   [provider]  sildenafil (REVATIO) 20 MG tablet Take 1 tablet (20 mg total) by mouth 3 (three) times daily as needed. Patient not taking: Reported on 06/19/2022 03/13/22   Dutch Quint B, FNP  sulfamethoxazole-trimethoprim (BACTRIM DS) 800-160 MG tablet Take 1 tablet by mouth 3 (three) times a week. Patient taking differently: Take 1 tablet by mouth every Monday, Wednesday, and Friday. Once daily on M-W F continuous 04/23/20   Little Ishikawa, MD  tamsulosin (FLOMAX) 0.4 MG CAPS capsule TAKE 1 CAPSULE BY MOUTH EVERY DAY Patient taking differently: Take 0.4 mg by mouth daily. 03/28/20   [name], MD  traMADol (ULTRAM) 50 MG tablet Take 50 mg by mouth every 8 (eight) hours as needed for moderate pain. 05/01/21   [provider]  [place] X 5/16" 1 ML MISC Use pen needle with insulin 2 times daily 03/31/19   [name], MD      Allergies    Oxycodone    Review of Systems   Review of Systems  Physical Exam Updated Vital Signs There were no vitals taken for this visit. Physical Exam  ED Results / Procedures / Treatments   Labs (all labs ordered are listed, but only abnormal results are displayed) Labs Reviewed - No data to display  EKG None  Radiology No results found.  Procedures Procedures  {Document cardiac monitor, telemetry assessment procedure when appropriate:1}  Medications Ordered in ED Medications - No data to display  ED Course/ Medical Decision Making/ A&P                           Medical Decision Making  ***  {Document critical care time when appropriate:1} {Document review of labs and clinical decision tools ie heart score, Chads2Vasc2 etc:1}  {Document your independent review of radiology images, and any outside records:1} {Document your discussion with family members, caretakers, and with consultants:1} {Document social determinants of health affecting pt's care:1} {Document your  decision making why or why not admission, treatments were needed:1} Final Clinical Impression(s) / ED Diagnoses Final diagnoses:  None    Rx / DC Orders ED Discharge Orders     None

## 2022-07-22 NOTE — ED Notes (Signed)
This RN spoke with [facility] facility to confirm pts residency. Lawsing MD made aware, [facility] called for transport

## 2022-07-22 NOTE — ED Notes (Signed)
ED Provider at bedside. 

## 2022-07-22 NOTE — ED Triage Notes (Signed)
Pt bib [name] coming from dialysis, pt only got two hours of dialysis and not the full 4 hours.  Baseline is pt usually sleeps and is responsive, he is not responding to voice.  CBG 123 122/78 BP

## 2022-07-22 NOTE — ED Notes (Signed)
Pt refusing blood draw - MD aware

## 2022-07-23 LAB — COMPREHENSIVE METABOLIC PANEL
ALT: 12 U/L (ref 0–44)
AST: 29 U/L (ref 15–41)
Albumin: 2.7 g/dL — ABNORMAL LOW (ref 3.5–5.0)
Alkaline Phosphatase: 99 U/L (ref 38–126)
Anion gap: 19 — ABNORMAL HIGH (ref 5–15)
BUN: 23 mg/dL (ref 8–23)
CO2: 24 mmol/L (ref 22–32)
Calcium: 8.2 mg/dL — ABNORMAL LOW (ref 8.9–10.3)
Chloride: 97 mmol/L — ABNORMAL LOW (ref 98–111)
Creatinine, Ser: 7.63 mg/dL — ABNORMAL HIGH (ref 0.61–1.24)
GFR, Estimated: 7 mL/min — ABNORMAL LOW (ref >60)
Glucose, Bld: 69 mg/dL — ABNORMAL LOW (ref 70–99)
Potassium: 4.6 mmol/L (ref 3.5–5.1)
Sodium: 140 mmol/L (ref 135–145)
Total Bilirubin: 0.5 mg/dL (ref 0.3–1.2)
Total Protein: 6.6 g/dL (ref 6.5–8.1)

## 2022-07-23 LAB — I-STAT VENOUS BLOOD GAS, ED
Acid-Base Excess: 7 mmol/L — ABNORMAL HIGH (ref 0.0–2.0)
Bicarbonate: 29.8 mmol/L — ABNORMAL HIGH (ref 20.0–28.0)
Calcium, Ion: 0.85 mmol/L — CL (ref 1.15–1.40)
HCT: 35 % — ABNORMAL LOW (ref 39.0–52.0)
Hemoglobin: 11.9 g/dL — ABNORMAL LOW (ref 13.0–17.0)
O2 Saturation: 63 %
Potassium: 4.2 mmol/L (ref 3.5–5.1)
Sodium: 136 mmol/L (ref 135–145)
TCO2: 31 mmol/L (ref 22–32)
pCO2, Ven: 36.3 mmHg — ABNORMAL LOW (ref 44–60)
pH, Ven: 7.523 — ABNORMAL HIGH (ref 7.25–7.43)
pO2, Ven: 29 mmHg — CL (ref 32–45)

## 2022-07-23 LAB — CBC WITH DIFFERENTIAL/PLATELET
Abs Immature Granulocytes: 0.02 10*3/uL (ref 0.00–0.07)
Basophils Absolute: 0 10*3/uL (ref 0.0–0.1)
Basophils Relative: 0 %
Eosinophils Absolute: 0.1 10*3/uL (ref 0.0–0.5)
Eosinophils Relative: 1 %
HCT: 38.3 % — ABNORMAL LOW (ref 39.0–52.0)
Hemoglobin: 11.6 g/dL — ABNORMAL LOW (ref 13.0–17.0)
Immature Granulocytes: 0 %
Lymphocytes Relative: 22 %
Lymphs Abs: 1.6 10*3/uL (ref 0.7–4.0)
MCH: 32.9 pg (ref 26.0–34.0)
MCHC: 30.3 g/dL (ref 30.0–36.0)
MCV: 108.5 fL — ABNORMAL HIGH (ref 80.0–100.0)
Monocytes Absolute: 0.9 10*3/uL (ref 0.1–1.0)
Monocytes Relative: 12 %
Neutro Abs: 4.8 10*3/uL (ref 1.7–7.7)
Neutrophils Relative %: 65 %
Platelets: 93 10*3/uL — ABNORMAL LOW (ref 150–400)
RBC: 3.53 MIL/uL — ABNORMAL LOW (ref 4.22–5.81)
RDW: 16 % — ABNORMAL HIGH (ref 11.5–15.5)
WBC: 7.5 10*3/uL (ref 4.0–10.5)
nRBC: 0 % (ref 0.0–0.2)

## 2022-07-23 LAB — ETHANOL: Alcohol, Ethyl (B): <10 mg/dL (ref <10)

## 2022-07-23 LAB — LACTIC ACID, PLASMA: Lactic Acid, Venous: 1.3 mmol/L (ref 0.5–1.9)

## 2022-07-23 LAB — PROTIME-INR
INR: 1.1 (ref 0.8–1.2)
Prothrombin Time: 14.3 seconds (ref 11.4–15.2)

## 2022-07-23 LAB — CBG MONITORING, ED: Glucose-Capillary: 124 mg/dL — ABNORMAL HIGH (ref 70–99)

## 2022-07-23 LAB — AMMONIA: Ammonia: 30 umol/L (ref 9–35)

## 2022-07-23 NOTE — ED Notes (Signed)
RN reviewed discharge instructions with pt and  PT[facility]. Pt verbalized understanding and had no further questions. VSS upon discharge

## 2022-07-23 NOTE — ED Provider Notes (Signed)
  Physical Exam  BP (!) 171/77 (BP Location: Right Arm)   Pulse 83   Temp 98.6 F (37 C) (Oral)   Resp 18   SpO2 100%   Physical Exam Constitutional:      General: He is not in acute distress.    Appearance: Normal appearance.  HENT:     Head: Normocephalic and atraumatic.     Nose: No congestion or rhinorrhea.  Eyes:     General:        Right eye: No discharge.        Left eye: No discharge.     Extraocular Movements: Extraocular movements intact.     Pupils: Pupils are equal, round, and reactive to light.  Cardiovascular:     Rate and Rhythm: Normal rate and regular rhythm.     Heart sounds: No murmur heard. Pulmonary:     Effort: No respiratory distress.     Breath sounds: No wheezing or rales.  Abdominal:     General: There is no distension.     Tenderness: There is no abdominal tenderness.  Musculoskeletal:        General: Normal range of motion.     Cervical back: Normal range of motion.  Skin:    General: Skin is warm and dry.  Neurological:     General: No focal deficit present.     Mental Status: He is alert.     Procedures  Procedures  ED Course / MDM    Medical Decision Making Amount and/or Complexity of Data Reviewed Labs: ordered. Radiology: ordered.  Risk Prescription drug management.   Patient received in handoff.  Patient initially refused all laboratory evaluation and proceeded to leave AMA but [facility] would not transport the patient as he left AMA and thus I went back to speak with the patient who ultimately agreed to let me perform laboratory evaluation.  He was found to be hyperglycemic which we treated with oral juice containing products and his sugar resolved.  Suspect patient's transient alteration of awareness was likely secondary to hypoglycemia during dialysis and he was encouraged to eat breakfast prior to having his session.  Patient then discharged       Teressa Lower, MD 07/23/22 [phone]

## 2022-07-23 NOTE — ED Notes (Signed)
Pt given orange juice and is eating candy for his blood sugar

## 2022-07-23 NOTE — ED Notes (Signed)
Kommor MD made aware of critical istat values

## 2022-07-30 ENCOUNTER — Encounter (HOSPITAL_COMMUNITY): Payer: Self-pay

## 2022-07-31 ENCOUNTER — Encounter (HOSPITAL_COMMUNITY): Payer: Self-pay

## 2022-08-04 ENCOUNTER — Encounter (HOSPITAL_COMMUNITY): Payer: Self-pay

## 2022-08-13 ENCOUNTER — Other Ambulatory Visit: Payer: Self-pay

## 2022-08-13 ENCOUNTER — Observation Stay (HOSPITAL_COMMUNITY)
Admission: EM | Admit: 2022-08-13 | Discharge: 2022-08-15 | Disposition: A | Discharge status: Skilled Nursing Facility | Payer: No Typology Code available for payment source | Attending: Internal Medicine | Admitting: Internal Medicine

## 2022-08-13 ENCOUNTER — Emergency Department (HOSPITAL_COMMUNITY): Payer: No Typology Code available for payment source

## 2022-08-13 DIAGNOSIS — E119 Type 2 diabetes mellitus without complications: Secondary | ICD-10-CM

## 2022-08-13 DIAGNOSIS — Z1152 Encounter for screening for COVID-19: Secondary | ICD-10-CM | POA: Diagnosis not present

## 2022-08-13 DIAGNOSIS — B20 Human immunodeficiency virus [HIV] disease: Secondary | ICD-10-CM | POA: Diagnosis present

## 2022-08-13 DIAGNOSIS — Z794 Long term (current) use of insulin: Secondary | ICD-10-CM | POA: Insufficient documentation

## 2022-08-13 DIAGNOSIS — I161 Hypertensive emergency: Secondary | ICD-10-CM | POA: Diagnosis not present

## 2022-08-13 DIAGNOSIS — J810 Acute pulmonary edema: Secondary | ICD-10-CM | POA: Diagnosis not present

## 2022-08-13 DIAGNOSIS — I6930 Unspecified sequelae of cerebral infarction: Secondary | ICD-10-CM

## 2022-08-13 DIAGNOSIS — Z8673 Personal history of transient ischemic attack (TIA), and cerebral infarction without residual deficits: Secondary | ICD-10-CM | POA: Insufficient documentation

## 2022-08-13 DIAGNOSIS — N186 End stage renal disease: Secondary | ICD-10-CM | POA: Diagnosis not present

## 2022-08-13 DIAGNOSIS — Z79899 Other long term (current) drug therapy: Secondary | ICD-10-CM | POA: Insufficient documentation

## 2022-08-13 DIAGNOSIS — F17210 Nicotine dependence, cigarettes, uncomplicated: Secondary | ICD-10-CM | POA: Insufficient documentation

## 2022-08-13 DIAGNOSIS — R7989 Other specified abnormal findings of blood chemistry: Secondary | ICD-10-CM

## 2022-08-13 DIAGNOSIS — Z992 Dependence on renal dialysis: Secondary | ICD-10-CM | POA: Diagnosis not present

## 2022-08-13 DIAGNOSIS — E1169 Type 2 diabetes mellitus with other specified complication: Secondary | ICD-10-CM

## 2022-08-13 DIAGNOSIS — Z7902 Long term (current) use of antithrombotics/antiplatelets: Secondary | ICD-10-CM | POA: Diagnosis not present

## 2022-08-13 DIAGNOSIS — Z8616 Personal history of COVID-19: Secondary | ICD-10-CM | POA: Insufficient documentation

## 2022-08-13 DIAGNOSIS — J9601 Acute respiratory failure with hypoxia: Secondary | ICD-10-CM | POA: Diagnosis present

## 2022-08-13 DIAGNOSIS — I120 Hypertensive chronic kidney disease with stage 5 chronic kidney disease or end stage renal disease: Secondary | ICD-10-CM | POA: Diagnosis not present

## 2022-08-13 DIAGNOSIS — R0602 Shortness of breath: Secondary | ICD-10-CM | POA: Diagnosis present

## 2022-08-13 DIAGNOSIS — G40909 Epilepsy, unspecified, not intractable, without status epilepticus: Secondary | ICD-10-CM

## 2022-08-13 DIAGNOSIS — E1122 Type 2 diabetes mellitus with diabetic chronic kidney disease: Secondary | ICD-10-CM | POA: Diagnosis not present

## 2022-08-13 DIAGNOSIS — J9600 Acute respiratory failure, unspecified whether with hypoxia or hypercapnia: Secondary | ICD-10-CM | POA: Diagnosis present

## 2022-08-13 LAB — GLUCOSE, CAPILLARY: Glucose-Capillary: 179 mg/dL — ABNORMAL HIGH (ref 70–99)

## 2022-08-13 LAB — CBC WITH DIFFERENTIAL/PLATELET
Abs Immature Granulocytes: 0.04 10*3/uL (ref 0.00–0.07)
Basophils Absolute: 0 10*3/uL (ref 0.0–0.1)
Basophils Relative: 0 %
Eosinophils Absolute: 0.2 10*3/uL (ref 0.0–0.5)
Eosinophils Relative: 2 %
HCT: 31.6 % — ABNORMAL LOW (ref 39.0–52.0)
Hemoglobin: 10.2 g/dL — ABNORMAL LOW (ref 13.0–17.0)
Immature Granulocytes: 1 %
Lymphocytes Relative: 20 %
Lymphs Abs: 1.5 10*3/uL (ref 0.7–4.0)
MCH: 32.7 pg (ref 26.0–34.0)
MCHC: 32.3 g/dL (ref 30.0–36.0)
MCV: 101.3 fL — ABNORMAL HIGH (ref 80.0–100.0)
Monocytes Absolute: 0.6 10*3/uL (ref 0.1–1.0)
Monocytes Relative: 7 %
Neutro Abs: 5.3 10*3/uL (ref 1.7–7.7)
Neutrophils Relative %: 70 %
Platelets: 157 10*3/uL (ref 150–400)
RBC: 3.12 MIL/uL — ABNORMAL LOW (ref 4.22–5.81)
RDW: 15.9 % — ABNORMAL HIGH (ref 11.5–15.5)
WBC: 7.5 10*3/uL (ref 4.0–10.5)
nRBC: 0.3 % — ABNORMAL HIGH (ref 0.0–0.2)

## 2022-08-13 LAB — BASIC METABOLIC PANEL
Anion gap: 16 — ABNORMAL HIGH (ref 5–15)
BUN: 47 mg/dL — ABNORMAL HIGH (ref 8–23)
CO2: 19 mmol/L — ABNORMAL LOW (ref 22–32)
Calcium: 8.2 mg/dL — ABNORMAL LOW (ref 8.9–10.3)
Chloride: 106 mmol/L (ref 98–111)
Creatinine, Ser: 10.97 mg/dL — ABNORMAL HIGH (ref 0.61–1.24)
GFR, Estimated: 5 mL/min — ABNORMAL LOW (ref >60)
Glucose, Bld: 73 mg/dL (ref 70–99)
Potassium: 4.5 mmol/L (ref 3.5–5.1)
Sodium: 141 mmol/L (ref 135–145)

## 2022-08-13 LAB — BRAIN NATRIURETIC PEPTIDE: B Natriuretic Peptide: 1861.9 pg/mL — ABNORMAL HIGH (ref 0.0–100.0)

## 2022-08-13 LAB — HEPATITIS B SURFACE ANTIGEN: Hepatitis B Surface Ag: NONREACTIVE

## 2022-08-13 LAB — RESP PANEL BY RT-PCR (FLU A&B, COVID) ARPGX2
Influenza A by PCR: NEGATIVE
Influenza B by PCR: NEGATIVE
SARS Coronavirus 2 by RT PCR: NEGATIVE

## 2022-08-13 LAB — TROPONIN I (HIGH SENSITIVITY): Troponin I (High Sensitivity): 647 ng/L (ref <18)

## 2022-08-13 MED ORDER — SENNOSIDES-DOCUSATE SODIUM 8.6-50 MG PO TABS: 1.0000 | ORAL_TABLET | Freq: Every evening | ORAL | Status: DC | PRN

## 2022-08-13 MED ORDER — PANTOPRAZOLE SODIUM 40 MG PO TBEC
40.0000 mg | DELAYED_RELEASE_TABLET | Freq: Every day | ORAL | Status: DC
  Administered 2022-08-14 – 2022-08-15 (×2): 40 mg via ORAL
  Filled 2022-08-13 (×2): qty 1

## 2022-08-13 MED ORDER — HEPARIN SODIUM (PORCINE) 1000 UNIT/ML DIALYSIS
1000.0000 [IU] | INTRAMUSCULAR | Status: DC | PRN
  Administered 2022-08-13: 3800 [IU]
  Filled 2022-08-13 (×2): qty 1

## 2022-08-13 MED ORDER — CLONIDINE HCL 0.1 MG PO TABS
0.1000 mg | ORAL_TABLET | Freq: Three times a day (TID) | ORAL | Status: DC
  Administered 2022-08-13 – 2022-08-15 (×5): 0.1 mg via ORAL
  Filled 2022-08-13 (×5): qty 1

## 2022-08-13 MED ORDER — SULFAMETHOXAZOLE-TRIMETHOPRIM 800-160 MG PO TABS
1.0000 | ORAL_TABLET | ORAL | Status: DC
  Administered 2022-08-13 – 2022-08-15 (×2): 1 via ORAL
  Filled 2022-08-13 (×2): qty 1

## 2022-08-13 MED ORDER — LABETALOL HCL 5 MG/ML IV SOLN
10.0000 mg | Freq: Once | INTRAVENOUS | Status: AC
  Administered 2022-08-13: 10 mg via INTRAVENOUS
  Filled 2022-08-13: qty 4

## 2022-08-13 MED ORDER — CLOPIDOGREL BISULFATE 75 MG PO TABS
75.0000 mg | ORAL_TABLET | Freq: Every day | ORAL | Status: DC
  Administered 2022-08-14 – 2022-08-15 (×2): 75 mg via ORAL
  Filled 2022-08-13 (×2): qty 1

## 2022-08-13 MED ORDER — PENTAFLUOROPROP-TETRAFLUOROETH EX AERO: 1.0000 | INHALATION_SPRAY | CUTANEOUS | Status: DC | PRN

## 2022-08-13 MED ORDER — HEPARIN SODIUM (PORCINE) 5000 UNIT/ML IJ SOLN
5000.0000 [IU] | Freq: Three times a day (TID) | INTRAMUSCULAR | Status: DC
  Administered 2022-08-13 – 2022-08-14 (×3): 5000 [IU] via SUBCUTANEOUS
  Filled 2022-08-13 (×4): qty 1

## 2022-08-13 MED ORDER — INSULIN ASPART 100 UNIT/ML IJ SOLN: 0.0000 [IU] | Freq: Three times a day (TID) | INTRAMUSCULAR | Status: DC

## 2022-08-13 MED ORDER — PROCHLORPERAZINE EDISYLATE 10 MG/2ML IJ SOLN: 10.0000 mg | Freq: Four times a day (QID) | INTRAMUSCULAR | Status: DC | PRN

## 2022-08-13 MED ORDER — DOLUTEGRAVIR-LAMIVUDINE 50-300 MG PO TABS
1.0000 | ORAL_TABLET | Freq: Every day | ORAL | Status: DC
  Administered 2022-08-14 – 2022-08-15 (×2): 1 via ORAL
  Filled 2022-08-13 (×2): qty 1

## 2022-08-13 MED ORDER — LIDOCAINE-PRILOCAINE 2.5-2.5 % EX CREA: 1.0000 | TOPICAL_CREAM | CUTANEOUS | Status: DC | PRN

## 2022-08-13 MED ORDER — ACETAMINOPHEN 650 MG RE SUPP: 650.0000 mg | Freq: Four times a day (QID) | RECTAL | Status: DC | PRN

## 2022-08-13 MED ORDER — ORAL CARE MOUTH RINSE: 15.0000 mL | OROMUCOSAL | Status: DC | PRN

## 2022-08-13 MED ORDER — HYDRALAZINE HCL 10 MG PO TABS
10.0000 mg | ORAL_TABLET | Freq: Three times a day (TID) | ORAL | Status: DC
  Administered 2022-08-13 – 2022-08-15 (×5): 10 mg via ORAL
  Filled 2022-08-13 (×5): qty 1

## 2022-08-13 MED ORDER — LABETALOL HCL 5 MG/ML IV SOLN: 10.0000 mg | INTRAVENOUS | Status: DC | PRN

## 2022-08-13 MED ORDER — ACETAMINOPHEN 325 MG PO TABS: 650.0000 mg | ORAL_TABLET | Freq: Four times a day (QID) | ORAL | Status: DC | PRN

## 2022-08-13 MED ORDER — DOXEPIN HCL 25 MG PO CAPS
75.0000 mg | ORAL_CAPSULE | Freq: Every day | ORAL | Status: DC
  Administered 2022-08-13 – 2022-08-14 (×2): 75 mg via ORAL
  Filled 2022-08-13: qty 3
  Filled 2022-08-13: qty 1
  Filled 2022-08-13 (×3): qty 3

## 2022-08-13 MED ORDER — CHLORHEXIDINE GLUCONATE CLOTH 2 % EX PADS
6.0000 | MEDICATED_PAD | Freq: Every day | CUTANEOUS | Status: DC
  Administered 2022-08-14 – 2022-08-15 (×2): 6 via TOPICAL

## 2022-08-13 MED ORDER — ANTICOAGULANT SODIUM CITRATE 4% (200MG/5ML) IV SOLN
5.0000 mL | Status: DC | PRN
  Filled 2022-08-13: qty 5

## 2022-08-13 MED ORDER — LEVETIRACETAM 500 MG PO TABS
500.0000 mg | ORAL_TABLET | Freq: Two times a day (BID) | ORAL | Status: DC
  Administered 2022-08-13 – 2022-08-15 (×4): 500 mg via ORAL
  Filled 2022-08-13 (×4): qty 1

## 2022-08-13 MED ORDER — ROSUVASTATIN CALCIUM 5 MG PO TABS
5.0000 mg | ORAL_TABLET | Freq: Every day | ORAL | Status: DC
  Administered 2022-08-14 – 2022-08-15 (×2): 5 mg via ORAL
  Filled 2022-08-13 (×2): qty 1

## 2022-08-13 MED ORDER — CARVEDILOL 12.5 MG PO TABS
12.5000 mg | ORAL_TABLET | Freq: Two times a day (BID) | ORAL | Status: DC
  Administered 2022-08-13 – 2022-08-15 (×4): 12.5 mg via ORAL
  Filled 2022-08-13 (×4): qty 1

## 2022-08-13 MED ORDER — LIDOCAINE HCL (PF) 1 % IJ SOLN: 5.0000 mL | INTRAMUSCULAR | Status: DC | PRN

## 2022-08-13 MED ORDER — FUROSEMIDE 10 MG/ML IJ SOLN
40.0000 mg | Freq: Once | INTRAMUSCULAR | Status: AC
  Administered 2022-08-13: 40 mg via INTRAVENOUS
  Filled 2022-08-13: qty 4

## 2022-08-13 MED ORDER — TAMSULOSIN HCL 0.4 MG PO CAPS
0.4000 mg | ORAL_CAPSULE | Freq: Every day | ORAL | Status: DC
  Administered 2022-08-13 – 2022-08-15 (×3): 0.4 mg via ORAL
  Filled 2022-08-13 (×3): qty 1

## 2022-08-13 MED ORDER — ALTEPLASE 2 MG IJ SOLR: 2.0000 mg | Freq: Once | INTRAMUSCULAR | Status: DC | PRN

## 2022-08-13 MED ORDER — SODIUM CHLORIDE 0.9% FLUSH
3.0000 mL | Freq: Two times a day (BID) | INTRAVENOUS | Status: DC
  Administered 2022-08-13 – 2022-08-15 (×4): 3 mL via INTRAVENOUS

## 2022-08-13 NOTE — Assessment & Plan Note (Signed)
Continue Keppra.

## 2022-08-13 NOTE — Assessment & Plan Note (Signed)
Placed on very sensitive SSI.

## 2022-08-13 NOTE — Progress Notes (Signed)
Renal Quick Note  S: Aware that Mr. Gatt is here with dyspnea. Last outpatient HD 11/16 - chronically cuts his HD time.  O: Vitals:   08/13/22 1045 08/13/22 1115  BP: (!) 158/75 (!) 159/73  Pulse: 84 85  Resp: 19 20  Temp:    SpO2: 100% 99%   He is refusing lab draw here.  Outpatient labs: 08/05/22: K 5.9, CO2 15, Ca 8.2, Phos 10.7, Alb 3.4, Hgb10, HBsAg negative  A/P:  ESRD: Will bring him up for dialysis today - max UF tolerated. We can draw labs via his [facility] when comes up. Anticipate hyperkalemia with missed HD. Hopefully he will be stable for discharge after his HD here.  Veneta Penton, PA-C [employer] Pager [phone]

## 2022-08-13 NOTE — Progress Notes (Signed)
Lab called to report critical troponin level of 647. MD [facility] notified along with ER nurse receiving report.

## 2022-08-13 NOTE — H&P (Signed)
History and Physical    Ab Leaming OEU:[id number] DOB: 04-09-1956 DOA: 08/13/2022  PCP: [name], MD (Inactive)  Patient coming from: Home  I have personally briefly reviewed patient's old medical records in [place]  Chief Complaint: Shortness of breath  HPI: Richard [name] is a 66 y.o. male with medical history significant for ESRD on TTS HD, HIV, history of CVA with residual right-sided weakness, seizure disorder, insulin-dependent T2DM, HTN, tobacco use who presented to the ED for shortness of breath.  Patient came to the ED for respiratory distress, shortness of breath, pulmonary edema in setting of dialysis nonadherence (last HD completed 11/16) per nephrology note.  Patient seen in ED post emergent dialysis session.  Respiratory status improving however he is concerned about recurrent dyspnea and pulmonary edema.  He states that he still makes good amount of urine.  He has not had any chest pain.  He reports cough which he feels is related to chest congestion.  ED Course  Labs/Imaging on admission: I have personally reviewed following labs and imaging studies.  Initial vitals showed BP 210/96, pulse 91, RR 24, temp 98.2 F, SpO2 79% on room air.  Placed on 6 L O2 via [employer] with SpO2 100%.  Labs showed BUN 47, creatinine 10.97, potassium 4.5, sodium 141, bicarb 19, BNP 1861.9, WBC 7.5, hemoglobin 10.2, platelets 157,000.  SARS-CoV-2 and influenza PCR negative.  Portable chest x-ray showed increased interstitial markings.  Nephrology consulted and patient was taken for emergent dialysis.  Respiratory status improved however troponin elevated at 647 and patient remains hypertensive therefore the hospitalist service was consulted to admit for further evaluation and management.  Review of Systems: All systems reviewed and are negative except as documented in history of present illness above.   Past Medical History:  Diagnosis Date   COVID-19  05/16/2020   Diabetes mellitus, type II, insulin dependent ([place])    ESRD on hemodialysis ([place]) 08/30/2021   GERD (gastroesophageal reflux disease)    Hematoma 03/19/2022   HIV (human immunodeficiency virus infection) ([place])    Hypertension    Meningoencephalitis    Neuropathy in diabetes ([place])    bilat feet   Recurrent falls    possible small SAH right frontal lobe 12/19/21 (Plavix held x 1 week); left transverse foramen C3 fracture 11/04/21   Ruptured lumbar disc    Seizure disorder ([place])    Stroke ([place]) 2013   residual right sided weakness (arm>leg)    Past Surgical History:  Procedure Laterality Date   AV FISTULA PLACEMENT Left 04/25/2020   Procedure: LEFT ARM BRACHIOCEPHALIC ARTERIOVENOUS (AV) FISTULA CREATION;  Surgeon: Rosetta Posner, MD;  Location: [facility];  Service: Vascular;  Laterality: Left;   AV FISTULA PLACEMENT Left 02/28/2022   Procedure: LEFT ARM INSERTION OF ARTERIOVENOUS (AV) GORE-TEX GRAFT ARM;  Surgeon: Marty Heck, MD;  Location: [place] [facility];  Service: Vascular;  Laterality: Left;   IR FLUORO GUIDE CV LINE RIGHT  04/13/2020   IR FLUORO GUIDE CV LINE RIGHT  01/31/2022   IR REMOVAL TUN CV CATH W/O FL  01/14/2021   IR US GUIDE VASC ACCESS RIGHT  04/13/2020   IR US GUIDE VASC ACCESS RIGHT  01/31/2022   IR US GUIDE VASC ACCESS RIGHT  01/31/2022   KNEE ARTHROSCOPY Bilateral     Social History:  reports that he has been smoking cigarettes. He has a 17.50 pack-year smoking history. He has been exposed to tobacco smoke. He has never used smokeless tobacco.  He reports that he does not currently use drugs after having used the following drugs: Marijuana. He reports that he does not drink alcohol.  Allergies  Allergen Reactions   Oxycodone Nausea And Vomiting    NOT DOCUMENTED ON MAR    Family History  Problem Relation Age of Onset   Other Mother    Stroke Father    Hypertension Father    Epilepsy Father    Breast cancer Sister    Stroke Brother    Heart attack Brother     Diabetes Sister      Prior to Admission medications   Medication Sig Start Date End Date Taking? Authorizing Provider  ACCU-CHEK AVIVA PLUS test strip USE 1 strip TO test blood sugar 2 TIMES DAILY TO 3 TIMES DAILY 06/10/19   [name], MD  acetaminophen (TYLENOL) 325 MG tablet Take 650 mg by mouth every 6 (six) hours as needed for moderate pain, fever or mild pain.    [provider]  b complex-vitamin c-folic acid (NEPHRO-VITE) 0.8 MG TABS tablet Take 1 tablet by mouth daily.    [provider]  blood glucose meter kit and supplies KIT Dispense based on patient and insurance preference. Check blood sugar 4 times a day 06/11/18   [name], MD  calcium acetate (PHOSLO) 667 MG capsule Take 2,001 mg by mouth 3 (three) times daily with meals.    [provider]  carvedilol (COREG) 12.5 MG tablet Take 12.5 mg by mouth 2 (two) times daily.    [provider]  cloNIDine (CATAPRES) 0.1 MG tablet Take 1 tablet (0.1 mg total) by mouth 3 (three) times daily. 02/21/21   Hosie Poisson, MD  clopidogrel (PLAVIX) 75 MG tablet Take 1 tablet (75 mg total) by mouth daily. 03/07/20   [name], MD  Darbepoetin Alfa (ARANESP) 60 MCG/0.3ML SOSY injection Inject 0.3 mLs (60 mcg total) into the vein every Saturday with hemodialysis. Patient not taking: Reported on 06/19/2022 01/19/21   Bonnielee Haff, MD  docusate sodium (COLACE) 100 MG capsule Take 100 mg by mouth daily.    [provider]  dolutegravir-lamiVUDine (DOVATO) 50-300 MG tablet Take 1 tablet by mouth daily. 03/19/22   Truman Hayward, MD  doxepin (SINEQUAN) 75 MG capsule Take 75 mg by mouth at bedtime.    [provider]  epoetin alfa-epbx (RETACRIT) 50354 UNIT/ML injection Inject 20,000 Units into the skin every Monday.    [provider]  Ergocalciferol 50 MCG (2000 UT) TABS Take 2,000 Units by mouth daily.    [provider]  hydrALAZINE (APRESOLINE)  10 MG tablet Take 10 mg by mouth 3 (three) times daily.    [provider]  hydrocortisone cream 1 % Apply 1 Application topically every 6 (six) hours as needed for itching. Apply to affected areas on back arms    [provider]  insulin glargine-yfgn (SEMGLEE) 100 UNIT/ML Pen Inject 5 Units into the skin at bedtime.    [provider]  Insulin Pen Needle (SURE COMFORT PEN NEEDLES) 31G X 8 MM MISC Use as directed twice daily with Novolog flex pen 02/10/19   Koberlein, Junell C, MD  levETIRAcetam (KEPPRA) 500 MG tablet Take 500 mg by mouth 2 (two) times daily.    [provider]  Lidocaine 3 % CREA Apply 1 application topically See admin instructions. Apply 1 times a day on Tuesday, Thursday and Saturday prior to dialysis    [provider]  Misc. Devices (COMMODE 3-IN-1) MISC Use as directed 02/24/20   [name], MD  Misc. Devices (TRANSFER BENCH) MISC Use as directed 02/24/20   Koberlein, [name], MD  ondansetron (ZOFRAN) 4 MG tablet Take 4 mg by mouth every 8 (eight) hours as needed for vomiting or nausea. 12/09/21   [provider]  oxyCODONE-acetaminophen (PERCOCET) 5-325 MG tablet Take 1 tablet by mouth every 6 (six) hours as needed for severe pain. Patient not taking: Reported on 06/19/2022 02/28/22 02/28/23  Vicente Serene P, PA-C  pantoprazole (PROTONIX) 40 MG tablet TAKE 1 TABLET BY MOUTH EVERY DAY Patient taking differently: Take 40 mg by mouth daily. 01/23/20   Koberlein, [name], MD  pramoxine-hydrocortisone 1-1 % lotion Apply topically 3 (three) times daily.    [provider]  rosuvastatin (CRESTOR) 5 MG tablet Take 5 mg by mouth daily. 12/03/21   [provider]  sildenafil (REVATIO) 20 MG tablet Take 1 tablet (20 mg total) by mouth 3 (three) times daily as needed. Patient not taking: Reported on 06/19/2022 03/13/22   Dutch Quint B, FNP  sulfamethoxazole-trimethoprim (BACTRIM DS) 800-160 MG tablet Take 1 tablet by  mouth 3 (three) times a week. Patient taking differently: Take 1 tablet by mouth every Monday, Wednesday, and Friday. Once daily on M-W F continuous 04/23/20   Little Ishikawa, MD  tamsulosin (FLOMAX) 0.4 MG CAPS capsule TAKE 1 CAPSULE BY MOUTH EVERY DAY Patient taking differently: Take 0.4 mg by mouth daily. 03/28/20   [name], MD  traMADol (ULTRAM) 50 MG tablet Take 50 mg by mouth every 8 (eight) hours as needed for moderate pain. 05/01/21   [provider]  TRUEPLUS INSULIN SYRINGE 31G X 5/16" 1 ML MISC Use pen needle with insulin 2 times daily 03/31/19   [name], MD    Physical Exam: Vitals:   08/13/22 1600 08/13/22 1630 08/13/22 1658 08/13/22 1845  BP: (!) 209/77 (!) 202/110 (!) 226/91 (!) 208/92  Pulse: 89 95 (!) 116 96  Resp: 18 (!) 22 (!) 32 18  Temp:   98.8 F (37.1 C)   TempSrc:   Oral   SpO2: 98% 99% 98% 100%   Constitutional: Chronically ill-appearing man resting in bed with head elevated, NAD, calm, comfortable Eyes: EOMI, lids and conjunctivae normal ENMT: Mucous membranes are moist. Posterior pharynx clear of any exudate or lesions.Normal dentition.  Neck: normal, supple, no masses. Respiratory: clear to auscultation bilaterally, no wheezing, no crackles. Normal respiratory effort. No accessory muscle use.  Cardiovascular: Regular rate and rhythm, no murmurs / rubs / gallops. No extremity edema.  TDC right chest, LUE AVF. Abdomen: no tenderness, no masses palpated.  Musculoskeletal: no clubbing / cyanosis.  Contracture RUE. Normal muscle tone.  Skin: no rashes, lesions, ulcers. No induration Neurologic: Sensation intact. Strength 5/5 in all 4.  Psychiatric: Normal judgment and insight. Alert and oriented x 3. Normal mood.   EKG: Personally reviewed. Sinus rhythm, rate 83, LVH, biatrial enlargement.  Similar to prior.  Assessment/Plan Principal Problem:   Hypertensive emergency Active Problems:   Elevated troponin   ESRD on  hemodialysis (HCC)   Acute respiratory failure with hypoxia (HCC)   History of cerebrovascular accident (CVA) with residual deficit   DM2 (diabetes mellitus, type 2) ([place])   Seizure disorder ([place])   Human immunodeficiency virus (HIV) disease ([place])   Shann Merrick is a 66 y.o. male with medical history significant for ESRD on TTS HD, HIV, history of CVA with  residual right-sided weakness, seizure disorder, insulin-dependent T2DM, HTN, tobacco use who is admitted with acute hypoxic respiratory failure due to pulmonary edema in setting of hypertensive emergency.  Assessment and Plan: * Hypertensive emergency Acute respiratory failure with hypoxia/pulmonary edema SpO2 79% on RA on arrival.  S/p hemodialysis with improvement in respiratory status, now saturating well on room air.  BP still elevated. -Resume home Coreg, clonidine, hydralazine -IV labetalol as needed  ESRD on hemodialysis ([place]) Usually TTS schedule.  S/p emergent HD per nephrology with improvement in respiratory status.  May need repeat HD tomorrow.  Elevated troponin Troponin 647.  Patient denies chest pain.  EKG unchanged from prior.  Likely demand ischemia in setting of acute hypoxic respiratory failure due to pulmonary edema secondary to hypertensive emergency. -Keep on telemetry -Repeat troponin  Acute respiratory failure with hypoxia (HCC) Resolved with emergent hemodialysis.  History of cerebrovascular accident (CVA) with residual deficit Chronic residual right-sided weakness, wheelchair-bound. -Continue Plavix and statin  Human immunodeficiency virus (HIV) disease ([place]) Continue Dovato.  Seizure disorder ([place]) Continue Keppra.  DM2 (diabetes mellitus, type 2) ([place]) Placed on very sensitive SSI.  DVT prophylaxis: heparin injection 5,000 Units Start: 08/13/22 2200 Code Status: Full code, confirmed with patient on admission Family Communication: Discussed with patient, he has discussed with  family Disposition Plan: From home, dispo pending clinical progress Consults called: Nephrology Severity of Illness: The appropriate patient status for this patient is OBSERVATION. Observation status is judged to be reasonable and necessary in order to provide the required intensity of service to ensure the patient's safety. The patient's presenting symptoms, physical exam findings, and initial radiographic and laboratory data in the context of their medical condition is felt to place them at decreased risk for further clinical deterioration. Furthermore, it is anticipated that the patient will be medically stable for discharge from the hospital within 2 midnights of admission.   Zada Finders MD [facility]  If 7PM-7AM, please contact night-coverage [email or web address]  08/13/2022, 8:26 PM

## 2022-08-13 NOTE — Procedures (Signed)
I was present at this dialysis session. I have reviewed the session itself and made appropriate changes.   Labs pending. UF goal 4L.    There were no vitals filed for this visit.  No results for input(s): "NA", "K", "CL", "CO2", "GLUCOSE", "BUN", "CREATININE", "CALCIUM", "PHOS" in the last 168 hours.  Invalid input(s): "ALB"  Recent Labs  Lab 08/13/22 1020  WBC 7.5  NEUTROABS 5.3  HGB 10.2*  HCT 31.6*  MCV 101.3*  PLT 157    Scheduled Meds: Continuous Infusions:  anticoagulant sodium citrate     PRN Meds:.alteplase, anticoagulant sodium citrate, heparin, lidocaine (PF), lidocaine-prilocaine, pentafluoroprop-tetrafluoroeth   [name]  MD 08/13/2022, 3:56 PM

## 2022-08-13 NOTE — Progress Notes (Addendum)
Found 1 very small vein Rt upper FA - able to access with # 22, 1.75 able to draw 1 tube for labs - lavender and sent to lab. Flow became very slow - worried that small vein may clot off and loose IV access. Pt was becoming combative near end of blood draw- Grabbed my wrist and ttri to pull on IV with other hand. Asked pt. To stop pulling my wrist and he did. IV taped and further manipulation of IV stopped" yelling I don't want anymore stuff done." Explained to patient that he he needs Dialysis and that they need to know his potassium before he has a treatment. Says, " I don't need no dialysis and I don't want it anymore" MD in room and is aware what pt. Is saying. Lab/Phlebotomy called to try to get other labs needed.

## 2022-08-13 NOTE — Progress Notes (Signed)
Received patient in bed to unit.  Alert and oriented.  Informed consent signed and in chart.    Treatment completed: 1658  Patient tolerated well.  Transported back to the room  Alert, without acute distress.  Hand-off given to patient's nurse.   Access used: Catheter    Total UF removed: 2300 T:98.8 BP 226/91 HR 116 RR32   Patient Signed out AMA. AMA form in his chart.   [facility]

## 2022-08-13 NOTE — Assessment & Plan Note (Signed)
Acute respiratory failure with hypoxia/pulmonary edema SpO2 79% on RA on arrival.  S/p hemodialysis with improvement in respiratory status, now saturating well on room air.  BP still elevated. -Resume home Coreg, clonidine, hydralazine -IV labetalol as needed

## 2022-08-13 NOTE — Assessment & Plan Note (Signed)
Usually TTS schedule.  S/p emergent HD per nephrology with improvement in respiratory status.  May need repeat HD tomorrow.

## 2022-08-13 NOTE — Progress Notes (Signed)
   08/13/22 2120  Vitals  Temp 98 F (36.7 C)  Temp Source Oral  BP (!) 176/77  MAP (mmHg) 103  BP Location Right Leg  BP Method Automatic  Patient Position (if appropriate) Lying  Pulse Rate Source Monitor  Resp 18  MEWS COLOR  MEWS Score Color Green  Oxygen Therapy  SpO2 100 %  O2 Device Room Air  Pain Assessment  Pain Scale 0-10  Pain Score 0  Height and Weight  Height 6' (1.829 m)  Weight 76.9 kg  Type of Scale Used Bed (zeroed)  Type of Weight Actual  BSA (Calculated - sq m) 1.98 sq meters  BMI (Calculated) 22.99  Weight in (lb) to have BMI = 25 183.9  MEWS Score  MEWS Temp 0  MEWS Systolic 0  MEWS Pulse 0  MEWS RR 0  MEWS LOC 0  MEWS Score 0   Pt arrived to 4E22 from ED. VSS. No complaints of pain or SOB. Lab shortly arrived after I left room to attempt to collect a repeat troponin- pt refused labs for no specific reason.  Oriented to room and call light. Speaking w/ a friend on the phone at this time.

## 2022-08-13 NOTE — ED Notes (Signed)
Patient returned from dialysis.
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
## 2022-08-13 NOTE — Assessment & Plan Note (Signed)
Chronic residual right-sided weakness, wheelchair-bound. -Continue Plavix and statin

## 2022-08-13 NOTE — ED Provider Notes (Signed)
Pt is 66 year old male with ESRD on HD (T,Th,Sat), HIV, CVA, GERD, and HTN who presented with acute hypoxia and shortness of breath.  His last dialysis was 08/07/2022.  He had dialysis performed today and is no longer hypoxic but is still significantly hypertensive most recent blood pressure 226/91 with tachycardia and tachypnea.  He is denying any chest pain.  Troponin is elevated 647 from baseline of 50 concern for demand ischemia from possible hypertensive emergency and associated pulmonary edema.  Patient endorses making urine still.  Will start patient on IV labetalol and Lasix and reach out to medicine for admission.  7:00 PM Discussed case with on-call hospitalist will be down to evaluate the patient and admit him for further management of concern for hypertensive emergency vs urgency. CRITICAL CARE Performed by: Elgie Congo   Total critical care time: 30 minutes  Critical care time was exclusive of separately billable procedures and treating other patients.  Critical care was necessary to treat or prevent imminent or life-threatening deterioration.  Critical care was time spent personally by me on the following activities: development of treatment plan with patient and/or surrogate as well as nursing, discussions with consultants, evaluation of patient's response to treatment, examination of patient, obtaining history from patient or surrogate, ordering and performing treatments and interventions, ordering and review of laboratory studies, ordering and review of radiographic studies, pulse oximetry and re-evaluation of patient's condition.    Elgie Congo, MD 08/13/22 1900

## 2022-08-13 NOTE — Assessment & Plan Note (Signed)
Troponin 647.  Patient denies chest pain.  EKG unchanged from prior.  Likely demand ischemia in setting of acute hypoxic respiratory failure due to pulmonary edema secondary to hypertensive emergency. -Keep on telemetry -Repeat troponin

## 2022-08-13 NOTE — ED Notes (Signed)
Patient denies pain and is resting comfortably.  

## 2022-08-13 NOTE — Assessment & Plan Note (Signed)
Resolved with emergent hemodialysis.

## 2022-08-13 NOTE — Assessment & Plan Note (Signed)
Continue Dovato. 

## 2022-08-13 NOTE — ED Notes (Signed)
Meal tray ordered for pt, to be delivered at 8pm

## 2022-08-13 NOTE — Hospital Course (Signed)
Richard [name] is a 66 y.o. male with medical history significant for ESRD on TTS HD, HIV, history of CVA with residual right-sided weakness, seizure disorder, insulin-dependent T2DM, HTN, tobacco use who is admitted with acute hypoxic respiratory failure due to pulmonary edema in setting of hypertensive emergency.

## 2022-08-13 NOTE — ED Triage Notes (Signed)
Pt arrived with [facility] from [facility] for resp distress, onset 30 mins ago. Rhonchi R sided, wheezing throughout. Hx of dialysis and CHF. Has missed one dialysis tx, unsure which day due to holiday schedule. Pt A&Ox4 on arrival, labored breathing, hypertensive. Initial room air sats 79%, placed on 8L by facility with improvement of sats.

## 2022-08-14 ENCOUNTER — Encounter (HOSPITAL_COMMUNITY): Payer: Self-pay | Admitting: Internal Medicine

## 2022-08-14 DIAGNOSIS — I161 Hypertensive emergency: Secondary | ICD-10-CM | POA: Diagnosis not present

## 2022-08-14 LAB — GLUCOSE, CAPILLARY
Glucose-Capillary: 118 mg/dL — ABNORMAL HIGH (ref 70–99)
Glucose-Capillary: 173 mg/dL — ABNORMAL HIGH (ref 70–99)
Glucose-Capillary: 147 mg/dL — ABNORMAL HIGH (ref 70–99)

## 2022-08-14 MED ORDER — HYDROCORTISONE 1 % EX CREA
1.0000 | TOPICAL_CREAM | Freq: Two times a day (BID) | CUTANEOUS | Status: DC | PRN
  Administered 2022-08-14 (×2): 1 via TOPICAL
  Filled 2022-08-14: qty 28

## 2022-08-14 MED ORDER — ACETAMINOPHEN 325 MG PO TABS: 650.0000 mg | ORAL_TABLET | Freq: Four times a day (QID) | ORAL | Status: DC | PRN

## 2022-08-14 NOTE — Progress Notes (Signed)
Pt remains inpatient under observation status HD yesterday 2.3 L UF PO BP meds resume and BPs much improved today.   Pt declined AM Labs.  No role for HD today.  He can continue on THS schedule at this time probably as outpt.  Will assist as needed.

## 2022-08-14 NOTE — TOC Progression Note (Signed)
Transition of Care [facility]) - Progression Note    Patient Details  Name: Richard [name] MRN: [id number] Date of Birth: Dec 04, 1955  Transition of Care [facility]) CM/SW [place], [place] Phone Number: 08/14/2022, 10:48 AM  Clinical Narrative:     CSW called [facility], left voice message- requested return call   Thurmond Butts, MSW, LCSW Clinical Social Worker                                                         Social Determinants of Health (SDOH) Interventions    Readmission Risk Interventions    10/22/2021   11:56 AM 02/15/2020    3:49 PM  Readmission Risk Prevention Plan  Transportation Screening Complete Complete  PCP or Specialist Appt within 5-7 Days  Not Complete  Not Complete comments  pending disposition  Home Care Screening  Complete  Medication Review (RN CM)  Referral to Pharmacy  Medication Review (RN Care Manager) Complete   PCP or Specialist appointment within 3-5 days of discharge Complete   HRI or Home Care Consult Complete   SW Recovery Care/Counseling Consult Complete   [place] Complete

## 2022-08-14 NOTE — Progress Notes (Signed)
Pt refused morning labs and cbg check

## 2022-08-14 NOTE — Progress Notes (Signed)
Richard [name]  MWU:[id number] DOB: 1956/04/24 DOA: 08/13/2022 PCP: [name], MD (Inactive)    Brief Narrative:  66 year old with a history of ESRD on HD TTS, HIV, CVA with residual right-sided weakness, seizure disorder, DM2, HTN, and tobacco abuse who presented to the ER with shortness of breath after not adhering with his dialysis schedule since 08/07/2022.  At presentation he was found to be markedly volume overloaded with blood pressure of 210/96, and underwent emergent dialysis.  Consultants:  Nephrology  Goals of Care:  Code Status: Full Code   DVT prophylaxis: Subcutaneous heparin  Interim Hx: Patient refused labs this morning and would not allow his CBG to be checked.  Resting comfortably in bed at the time of my visit.  Alert and oriented with no complaints.  The patient is medically stable and would be discharged from the hospital today, but his SNF is unable to accept him today given that it is a holiday.  Assessment & Plan:  Hypertensive emergency Due to volume overload in setting of noncompliance with dialysis -blood pressure now well controlled after dialysis and with medication administration  Acute pulmonary edema /volume overload Due to noncompliance with dialysis -ongoing dialysis at discretion of Nephrology  ESRD with noncompliance with HD Usually on a TTS schedule - Nephrology following  Elevated troponin without chest pain Likely a consequence of malignant hypertension as well as ESRD without dialysis -no chest pain whatsoever -no acute findings on EKG - further work-up not indicated acutely  History of CVA with residual right-sided weakness Continue Plavix and statin  HIV Continue usual home medical therapy  Seizure disorder Continue usual Keppra dose  DM2 Continue SSI  Family Communication: No family present Disposition: Medically stable for discharge 11/23 -awaiting SNF availability to take patient back   Objective: Blood  pressure (!) 154/70, pulse 82, temperature 97.6 F (36.4 C), temperature source Oral, resp. rate 20, height 6' (1.829 m), weight 76.9 kg, SpO2 100 %.  Intake/Output Summary (Last 24 hours) at 08/14/2022 1659 Last data filed at 08/14/2022 1220 Gross per 24 hour  Intake 120 ml  Output 200 ml  Net -80 ml   Filed Weights   08/13/22 2120 08/14/22 0500  Weight: 76.9 kg 76.9 kg    Examination: General: No acute respiratory distress Lungs: Clear to auscultation bilaterally without wheezes or crackles Cardiovascular: Regular rate and rhythm without murmur gallop or rub normal S1 and S2 Abdomen: Nontender, nondistended, soft, bowel sounds positive, no rebound, no ascites, no appreciable mass Extremities: No significant cyanosis, clubbing, or edema bilateral lower extremities  CBC: Recent Labs  Lab 08/13/22 1020  WBC 7.5  NEUTROABS 5.3  HGB 10.2*  HCT 31.6*  MCV 101.3*  PLT 725   Basic Metabolic Panel: Recent Labs  Lab 08/13/22 1452  NA 141  K 4.5  CL 106  CO2 19*  GLUCOSE 73  BUN 47*  CREATININE 10.97*  CALCIUM 8.2*   GFR: Estimated Creatinine Clearance: 7.4 mL/min (A) (by C-G formula based on SCr of 10.97 mg/dL (H)).   Scheduled Meds:  carvedilol  12.5 mg Oral BID   Chlorhexidine Gluconate Cloth  6 each Topical Daily   cloNIDine  0.1 mg Oral TID   clopidogrel  75 mg Oral Daily   dolutegravir-lamiVUDine  1 tablet Oral Daily   doxepin  75 mg Oral QHS   heparin  5,000 Units Subcutaneous Q8H   hydrALAZINE  10 mg Oral TID   insulin aspart  0-6 Units Subcutaneous TID WC  levETIRAcetam  500 mg Oral BID   pantoprazole  40 mg Oral Daily   rosuvastatin  5 mg Oral Daily   sodium chloride flush  3 mL Intravenous Q12H   sulfamethoxazole-trimethoprim  1 tablet Oral Q M,W,F   tamsulosin  0.4 mg Oral Daily     LOS: 0 days   Cherene Altes, MD [facility] Office  [phone] Pager - Text Page per Shea Evans  If 7PM-7AM, please contact night-coverage per  Amion 08/14/2022, 4:59 PM

## 2022-08-14 NOTE — TOC Transition Note (Addendum)
Transition of Care [facility]) - CM/SW Discharge Note   Patient Details  Name: Richard [name] MRN: [id number] Date of Birth: 1956/03/16  [facility]) CM/SW Contact:  Vinie Sill, LCSW Phone Number: 08/14/2022, 12:53 PM   Clinical Narrative:     Update: 1:20pm- per SNF unable to admit today- can receive tomorrow   CSW met with patient at bedside. CSW introduced self and explained role. Patient confirmed he was form [facility] and agreeable to returning to SNF.   CSW LVM and sent message to Admission/ [facility]- waiting on response.  TOC will continue to follow and assist with discharge planning.  Thurmond Butts, MSW, LCSW Clinical Social Worker    Final next level of care: Skilled Nursing Facility Barriers to Discharge:  (pending if SNF can admit today)   Patient Goals and CMS Choice        Discharge Placement                       Discharge Plan and Services In-house Referral: Clinical Social Work                                   Social Determinants of Health (SDOH) Interventions     Readmission Risk Interventions    10/22/2021   11:56 AM 02/15/2020    3:49 PM  Readmission Risk Prevention Plan  Transportation Screening Complete Complete  PCP or Specialist Appt within 5-7 Days  Not Complete  Not Complete comments  pending disposition  Home Care Screening  Complete  Medication Review (RN CM)  Referral to Pharmacy  Medication Review (RN Care Manager) Complete   PCP or Specialist appointment within 3-5 days of discharge Complete   HRI or Home Care Consult Complete   SW Recovery Care/Counseling Consult Complete   [place] Complete

## 2022-08-15 DIAGNOSIS — I161 Hypertensive emergency: Secondary | ICD-10-CM | POA: Diagnosis not present

## 2022-08-15 LAB — GLUCOSE, CAPILLARY
Glucose-Capillary: 94 mg/dL (ref 70–99)
Glucose-Capillary: 141 mg/dL — ABNORMAL HIGH (ref 70–99)

## 2022-08-15 NOTE — NC FL2 (Signed)
[place] LEVEL OF CARE SCREENING TOOL     IDENTIFICATION  Patient Name: Richard [name] Birthdate: July 07, 1956 Sex: male Admission Date (Current Location): 08/13/2022  [facility] and [place] Number:  Herbalist and Address:  [facility] [place], [place] [address], [place], [place] [address]      Provider Number: [id number]  Attending Physician Name and Address:  Cherene Altes, MD  Relative Name and Phone Number:       Current Level of Care: Hospital Recommended Level of Care: [place] Prior Approval Number:    Date Approved/Denied:   PASRR Number: [id number]  Discharge Plan: SNF    Current Diagnoses: Patient Active Problem List   Diagnosis Date Noted   Lack of housing 03/31/2022   Hematoma 03/19/2022   Other disorders of calcium metabolism 03/05/2022   Fall from non-moving wheelchair 10/19/2021   Hypertensive crisis 10/19/2021   Hypermagnesemia 10/19/2021   Allergic rhinitis 10/19/2021   ESRD on hemodialysis ([place]) 08/30/2021   Buttock pain 08/30/2021   Acute respiratory failure with hypoxia (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 ([place]) 12/20/2020   Human immunodeficiency virus (HIV) disease ([place]) 12/20/2020   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   Syncope 06/01/2020   Secondary hyperparathyroidism of renal origin ([place]) 05/28/2020   Hypokalemia 05/16/2020   Allergy, unspecified, initial encounter 05/08/2020   Anaphylactic shock, unspecified, initial encounter 05/08/2020   Anemia in chronic kidney disease  88/50/2774   Complication of vascular dialysis catheter 05/08/2020   Dependence on renal dialysis ([place]) 05/08/2020   Headache, unspecified 05/08/2020   Iron deficiency anemia, unspecified 05/08/2020   Cerebellar stroke syndrome 05/07/2020   Encounter for feeding tube placement    Weakness    Creatinine elevation    Goals of care, counseling/discussion    Hypertensive urgency    Palliative care by specialist    AIDS (acquired immune deficiency syndrome) ([place]) 04/13/2020   Seizure disorder ([place]) 04/10/2020   History of cerebrovascular accident (CVA) with residual deficit 03/19/2020   Neuropathy in diabetes ([place])    Migraines    Hypertension    GERD (gastroesophageal reflux disease)    DM2 (diabetes mellitus, type 2) ([place])    Arthritis    Hyperlipidemia 08/25/2012   Chronic kidney disease, stage 4 (severe) ([place]) 08/23/2012   Tobacco abuse 08/23/2012   Malnutrition of moderate degree ([place]) 08/23/2012   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 05/21/2012   Hypertensive emergency 05/21/2012    Orientation RESPIRATION BLADDER Height & Weight     Self, Time, Situation, Place  Normal Incontinent Weight: 169 lb 8.5 oz (76.9 kg) Height:  6' (182.9 cm)  BEHAVIORAL SYMPTOMS/MOOD NEUROLOGICAL BOWEL NUTRITION STATUS      Continent Diet (see d/c summary)  AMBULATORY STATUS COMMUNICATION OF NEEDS Skin   Extensive Assist Verbally Normal                       Personal Care Assistance Level of Assistance  Bathing, Feeding, Dressing Bathing Assistance: Limited assistance Feeding assistance: Independent Dressing Assistance: Limited assistance     Functional Limitations  Info  Sight, Hearing, Speech Sight Info: Adequate Hearing Info: Adequate Speech Info: Adequate    SPECIAL CARE FACTORS FREQUENCY  PT (By licensed PT), OT (By licensed OT)                    Contractures Contractures Info: Not present    Additional Factors Info  Code Status,  Allergies Code Status Info: Full code Allergies Info: oxycodone           Current Medications (08/15/2022):  This is the current hospital active medication list Current Facility-Administered Medications  Medication Dose Route Frequency Provider Last Rate Last Admin   acetaminophen (TYLENOL) tablet 650 mg  650 mg Oral Q6H PRN Cherene Altes, MD       alteplase (CATHFLO ACTIVASE) injection 2 mg  2 mg Intracatheter Once PRN Loren Racer, PA-C       anticoagulant sodium citrate solution 5 mL  5 mL Intracatheter PRN Loren Racer, PA-C       carvedilol (COREG) tablet 12.5 mg  12.5 mg Oral BID Zada Finders R, MD   12.5 mg at 08/15/22 0949   Chlorhexidine Gluconate Cloth 2 % PADS 6 each  6 each Topical Daily Lenore Cordia, MD   6 each at 08/14/22 1220   cloNIDine (CATAPRES) tablet 0.1 mg  0.1 mg Oral TID Zada Finders R, MD   0.1 mg at 08/15/22 0949   clopidogrel (PLAVIX) tablet 75 mg  75 mg Oral Daily Lenore Cordia, MD   75 mg at 08/15/22 0949   dolutegravir-lamiVUDine (DOVATO) 50-300 MG per tablet 1 tablet  1 tablet Oral Daily Lenore Cordia, MD   1 tablet at 08/15/22 0948   doxepin (SINEQUAN) capsule 75 mg  75 mg Oral QHS Zada Finders R, MD   75 mg at 08/14/22 2057   heparin injection 1,000 Units  1,000 Units Intracatheter PRN Loren Racer, PA-C   3,800 Units at 08/13/22 1722   heparin injection 5,000 Units  5,000 Units Subcutaneous Q8H Zada Finders R, MD   5,000 Units at 08/14/22 2100   hydrALAZINE (APRESOLINE) tablet 10 mg  10 mg Oral TID Lenore Cordia, MD   10 mg at 08/15/22 0949   hydrocortisone cream 1 % 1 Application  1 Application Topical BID PRN Cherene Altes, MD   1 Application at 83/15/17 2055   insulin aspart (novoLOG) injection 0-6 Units  0-6 Units Subcutaneous TID WC Zada Finders R, MD       labetalol (NORMODYNE) injection 10 mg  10 mg Intravenous Q4H PRN Lenore Cordia, MD       levETIRAcetam (KEPPRA) tablet 500 mg  500 mg Oral BID Zada Finders R, MD   500 mg at 08/15/22 0949   lidocaine (PF) (XYLOCAINE) 1 % injection 5 mL  5 mL Intradermal PRN Loren Racer, PA-C       lidocaine-prilocaine (EMLA) cream 1 Application  1 Application Topical PRN Loren Racer, PA-C       Oral care mouth rinse  15 mL Mouth Rinse PRN Lenore Cordia, MD       pantoprazole (PROTONIX) EC tablet 40 mg  40 mg Oral Daily Lenore Cordia, MD   40 mg at 08/15/22 6160   pentafluoroprop-tetrafluoroeth (GEBAUERS) aerosol 1 Application  1 Application Topical PRN Loren Racer, PA-C       prochlorperazine (COMPAZINE) injection 10 mg  10 mg Intravenous Q6H PRN Lenore Cordia, MD  rosuvastatin (CRESTOR) tablet 5 mg  5 mg Oral Daily Zada Finders R, MD   5 mg at 08/15/22 0949   senna-docusate (Senokot-S) tablet 1 tablet  1 tablet Oral QHS PRN Zada Finders R, MD       sodium chloride flush (NS) 0.9 % injection 3 mL  3 mL Intravenous Q12H Zada Finders R, MD   3 mL at 08/14/22 2102   sulfamethoxazole-trimethoprim (BACTRIM DS) 800-160 MG per tablet 1 tablet  1 tablet Oral Q M,W,F Lenore Cordia, MD   1 tablet at 08/15/22 0948   tamsulosin (FLOMAX) capsule 0.4 mg  0.4 mg Oral Daily Lenore Cordia, MD   0.4 mg at 08/15/22 8921     Discharge Medications: Please see discharge summary for a list of discharge medications.  Relevant Imaging Results:  Relevant Lab Results:   Additional Information [phone]  Bethann Berkshire, LCSW

## 2022-08-15 NOTE — Progress Notes (Signed)
Report given to Deeann Saint LPN at [facility].

## 2022-08-15 NOTE — Progress Notes (Addendum)
Patient refused am heparin shot,CHG bath and weight check,cbg check Explain the importance,verbalizes understanding

## 2022-08-15 NOTE — TOC Transition Note (Signed)
Transition of Care [facility]) - CM/SW Discharge Note   Patient Details  Name: Richard [name] MRN: [id number] Date of Birth: 1955/09/30  [facility] CM/SW Contact:  Bethann Berkshire, [place] Phone Number: 08/15/2022, 11:11 AM   Clinical Narrative:     Patient will DC to: [facility] SNF Anticipated DC date: 08/15/22 Transport by: Corey Harold   Per MD patient ready for DC to [facility]. RN, patient, and facility notified of DC. Discharge Summary and FL2 sent to facility. RN to call report prior to discharge [phone] ). DC packet on chart. Ambulance transport requested for patient.   CSW will sign off for now as social work intervention is no longer needed. Please consult [place] again if new needs arise.   Final next level of care: [place] Barriers to Discharge: No Barriers Identified   Patient Goals and CMS Choice        Discharge Placement              Patient chooses bed at:  ([facility]) Patient to be transferred to facility by: [facility]   Patient and family notified of of transfer: 08/15/22  Discharge Plan and Services In-house Referral: Clinical Social Work                                   Social Determinants of Health (SDOH) Interventions     Readmission Risk Interventions    10/22/2021   11:56 AM 02/15/2020    3:49 PM  Readmission Risk Prevention Plan  Transportation Screening Complete Complete  PCP or Specialist Appt within 5-7 Days  Not Complete  Not Complete comments  pending disposition  Home Care Screening  Complete  Medication Review (RN CM)  Referral to Pharmacy  Medication Review (RN Care Manager) Complete   PCP or Specialist appointment within 3-5 days of discharge Complete   HRI or Home Care Consult Complete   SW Recovery Care/Counseling Consult Complete   [place] Complete

## 2022-08-15 NOTE — Discharge Summary (Signed)
DISCHARGE SUMMARY  Richard [name]  MR#: [id number]  DOB:1956/04/26  Date of Admission: 08/13/2022 Date of Discharge: 08/15/2022  Attending Physician:Richard Berrocal Hennie Duos, MD  Patient's HRC:BULAGTXMI, Richard Berg, MD (Inactive)  Consults: Nephrology   Disposition: D/C to SNF   Follow-up Appts:  Follow-up Information     Richard Macadam, MD Follow up in 1 week(s).   Specialty: Family Medicine Contact information: [place] [address] [phone]                 Discharge Diagnoses: Hypertensive emergency Acute pulmonary edema - volume overload Noncompliance with dialysisESRD with noncompliance with HD Usually on a TTS schedule Elevated troponin without chest pain History of CVA with residual right-sided weakness HIV Seizure disorder DM2  Initial presentation: 66 year old with a history of ESRD on HD TTS, HIV, CVA with residual right-sided weakness, seizure disorder, DM2, HTN, and tobacco abuse who presented to the ER with shortness of breath after not adhering with his dialysis schedule since 08/07/2022.  At presentation he was found to be markedly volume overloaded with blood pressure of 210/96, and underwent emergent dialysis.   Hospital Course:  Hypertensive emergency Due to volume overload in setting of noncompliance with dialysis - blood pressure well controlled after dialysis and with medication administration   Acute pulmonary edema / volume overload Due to noncompliance with dialysis -resolved with dialysis administered during admission  Noncompliance As noted above patient is frequently noncompliant with his dialysis treatment, and refused many interventions during this hospital stay -he has been counseled that ongoing refusal to allow his medical care will deleteriously affect his long-term health   ESRD with noncompliance with HD Usually on a TTS schedule - Nephrology has attended to dialysis during his stay    Elevated troponin without chest pain Likely a consequence of malignant hypertension as well as ESRD without dialysis -no chest pain whatsoever -no acute findings on EKG - further work-up not indicated acutely   History of CVA with residual right-sided weakness Continue Plavix and statin   HIV Continue usual home medical therapy   Seizure disorder Continue usual Keppra dose   DM2 Continue SSI  Allergies as of 08/15/2022       Reactions   Oxycodone Nausea And Vomiting   NOT DOCUMENTED ON MAR        Medication List     STOP taking these medications    oxyCODONE-acetaminophen 5-325 MG tablet Commonly known as: Percocet   sildenafil 20 MG tablet Commonly known as: REVATIO   traMADol 50 MG tablet Commonly known as: ULTRAM       TAKE these medications    Accu-Chek Aviva Plus test strip Generic drug: glucose blood USE 1 strip TO test blood sugar 2 TIMES DAILY TO 3 TIMES DAILY   acetaminophen 325 MG tablet Commonly known as: TYLENOL Take 650 mg by mouth every 6 (six) hours as needed for moderate pain, fever or mild pain.   b complex-vitamin c-folic acid 0.8 MG Tabs tablet Take 1 tablet by mouth daily.   blood glucose meter kit and supplies Kit Dispense based on patient and insurance preference. Check blood sugar 4 times a day   calcium acetate 667 MG capsule Commonly known as: PHOSLO Take 2,001 mg by mouth 3 (three) times daily with meals.   carvedilol 12.5 MG tablet Commonly known as: COREG Take 12.5 mg by mouth 2 (two) times daily.   cloNIDine 0.1 MG tablet Commonly known as: CATAPRES Take 1 tablet (0.1  mg total) by mouth 3 (three) times daily.   clopidogrel 75 MG tablet Commonly known as: PLAVIX Take 1 tablet (75 mg total) by mouth daily.   Commode 3-In-1 Misc Use as directed   Transfer Bench Misc Use as directed   docusate sodium 100 MG capsule Commonly known as: COLACE Take 100 mg by mouth daily.   Dovato 50-300 MG tablet Generic drug:  dolutegravir-lamiVUDine Take 1 tablet by mouth daily.   doxepin 75 MG capsule Commonly known as: SINEQUAN Take 75 mg by mouth at bedtime.   Ergocalciferol 50 MCG (2000 UT) Tabs Take 2,000 Units by mouth daily.   feeding supplement (NEPRO CARB STEADY) Liqd Take 237 mLs by mouth 3 (three) times daily between meals.   hydrALAZINE 10 MG tablet Commonly known as: APRESOLINE Take 10 mg by mouth 3 (three) times daily.   hydrocortisone cream 1 % Apply 1 Application topically every 6 (six) hours as needed for itching. Apply to affected areas on back arms   insulin glargine-yfgn 100 UNIT/ML Pen Commonly known as: SEMGLEE Inject 5 Units into the skin at bedtime.   Insulin Pen Needle 31G X 8 MM Misc Commonly known as: Sure Comfort Pen Needles Use as directed twice daily with Novolog flex pen   levETIRAcetam 500 MG tablet Commonly known as: KEPPRA Take 500 mg by mouth 2 (two) times daily.   Lidocaine 3 % Crea Apply 1 application topically See admin instructions. Apply 1 times a day on Tuesday, Thursday and Saturday prior to dialysis   ondansetron 4 MG tablet Commonly known as: ZOFRAN Take 4 mg by mouth every 8 (eight) hours as needed for vomiting or nausea.   pantoprazole 40 MG tablet Commonly known as: PROTONIX TAKE 1 TABLET BY MOUTH EVERY DAY   Retacrit 20000 UNIT/ML injection Generic drug: epoetin alfa-epbx Inject 20,000 Units into the skin every Monday.   rosuvastatin 5 MG tablet Commonly known as: CRESTOR Take 5 mg by mouth daily.   sulfamethoxazole-trimethoprim 800-160 MG tablet Commonly known as: BACTRIM DS Take 1 tablet by mouth 3 (three) times a week. What changed:  when to take this additional instructions   tamsulosin 0.4 MG Caps capsule Commonly known as: FLOMAX TAKE 1 CAPSULE BY MOUTH EVERY DAY   triamcinolone cream 0.1 % Commonly known as: KENALOG Apply 1 Application topically in the morning and at bedtime. Apply to back and arms for rash twice a day    TRUEplus Insulin Syringe 31G X 5/16" 1 ML Misc Generic drug: Insulin Syringe-Needle U-100 Use pen needle with insulin 2 times daily        Day of Discharge BP (!) 158/75 (BP Location: Right Leg)   Pulse 91   Temp 97.8 F (36.6 C) (Oral)   Resp 20   Ht 6' (1.829 m)   Wt 76.9 kg   SpO2 100%   BMI 22.99 kg/m   Physical Exam: General: No acute respiratory distress Lungs: Clear to auscultation bilaterally without wheezes or crackles Cardiovascular: Regular rate and rhythm without murmur gallop or rub normal S1 and S2 Abdomen: Nontender, nondistended, soft, bowel sounds positive, no rebound, no ascites, no appreciable mass Extremities: No significant cyanosis, clubbing, or edema bilateral lower extremities  Basic Metabolic Panel: Recent Labs  Lab 08/13/22 1452  NA 141  K 4.5  CL 106  CO2 19*  GLUCOSE 73  BUN 47*  CREATININE 10.97*  CALCIUM 8.2*   CBC: Recent Labs  Lab 08/13/22 1020  WBC 7.5  NEUTROABS 5.3  HGB 10.2*  HCT 31.6*  MCV 101.3*  PLT 157   Time spent in discharge (includes decision making & examination of pt): 35 minutes  08/15/2022, 10:12 AM   Cherene Altes, MD [facility] Office  [phone]

## 2022-08-15 NOTE — Progress Notes (Addendum)
Pt discharged to [facility] via [facility] service.  AVS sent with PTAR. Pt taken off telemetry and CCMD notified.  PIV removed.  Pt left with all of their personal belongings.

## 2022-08-16 LAB — HEPATITIS B SURFACE ANTIBODY, QUANTITATIVE: Hep B S AB Quant (Post): <3.1 m[IU]/mL — ABNORMAL LOW (ref >9.9)

## 2022-08-25 DIAGNOSIS — I10 Essential (primary) hypertension: Secondary | ICD-10-CM | POA: Diagnosis not present

## 2022-08-25 DIAGNOSIS — E119 Type 2 diabetes mellitus without complications: Secondary | ICD-10-CM | POA: Diagnosis not present

## 2022-08-28 DIAGNOSIS — N2581 Secondary hyperparathyroidism of renal origin: Secondary | ICD-10-CM | POA: Diagnosis not present

## 2022-08-28 DIAGNOSIS — N186 End stage renal disease: Secondary | ICD-10-CM | POA: Diagnosis not present

## 2022-08-28 DIAGNOSIS — Z992 Dependence on renal dialysis: Secondary | ICD-10-CM | POA: Diagnosis not present

## 2022-08-31 ENCOUNTER — Other Ambulatory Visit: Payer: Self-pay

## 2022-08-31 ENCOUNTER — Inpatient Hospital Stay (HOSPITAL_COMMUNITY)
Admission: EM | Admit: 2022-08-31 | Discharge: 2022-09-06 | DRG: 640 | Disposition: A | Discharge status: Skilled Nursing Facility | Payer: Medicare HMO | Source: Skilled Nursing Facility | Attending: Internal Medicine | Admitting: Internal Medicine

## 2022-08-31 ENCOUNTER — Emergency Department (HOSPITAL_COMMUNITY): Payer: Medicare HMO

## 2022-08-31 DIAGNOSIS — Z7401 Bed confinement status: Secondary | ICD-10-CM | POA: Diagnosis not present

## 2022-08-31 DIAGNOSIS — R7989 Other specified abnormal findings of blood chemistry: Secondary | ICD-10-CM | POA: Diagnosis not present

## 2022-08-31 DIAGNOSIS — Z20822 Contact with and (suspected) exposure to covid-19: Secondary | ICD-10-CM | POA: Diagnosis present

## 2022-08-31 DIAGNOSIS — Z794 Long term (current) use of insulin: Secondary | ICD-10-CM

## 2022-08-31 DIAGNOSIS — E1122 Type 2 diabetes mellitus with diabetic chronic kidney disease: Secondary | ICD-10-CM | POA: Diagnosis present

## 2022-08-31 DIAGNOSIS — N186 End stage renal disease: Secondary | ICD-10-CM | POA: Diagnosis present

## 2022-08-31 DIAGNOSIS — F17210 Nicotine dependence, cigarettes, uncomplicated: Secondary | ICD-10-CM | POA: Diagnosis present

## 2022-08-31 DIAGNOSIS — E1140 Type 2 diabetes mellitus with diabetic neuropathy, unspecified: Secondary | ICD-10-CM | POA: Diagnosis present

## 2022-08-31 DIAGNOSIS — Z885 Allergy status to narcotic agent status: Secondary | ICD-10-CM

## 2022-08-31 DIAGNOSIS — G8190 Hemiplegia, unspecified affecting unspecified side: Secondary | ICD-10-CM | POA: Diagnosis not present

## 2022-08-31 DIAGNOSIS — J810 Acute pulmonary edema: Secondary | ICD-10-CM | POA: Diagnosis present

## 2022-08-31 DIAGNOSIS — I10 Essential (primary) hypertension: Secondary | ICD-10-CM | POA: Diagnosis not present

## 2022-08-31 DIAGNOSIS — E119 Type 2 diabetes mellitus without complications: Secondary | ICD-10-CM | POA: Diagnosis not present

## 2022-08-31 DIAGNOSIS — Z993 Dependence on wheelchair: Secondary | ICD-10-CM

## 2022-08-31 DIAGNOSIS — R231 Pallor: Secondary | ICD-10-CM | POA: Diagnosis not present

## 2022-08-31 DIAGNOSIS — I5022 Chronic systolic (congestive) heart failure: Secondary | ICD-10-CM | POA: Diagnosis present

## 2022-08-31 DIAGNOSIS — I69328 Other speech and language deficits following cerebral infarction: Secondary | ICD-10-CM

## 2022-08-31 DIAGNOSIS — Z1152 Encounter for screening for COVID-19: Secondary | ICD-10-CM

## 2022-08-31 DIAGNOSIS — E1169 Type 2 diabetes mellitus with other specified complication: Secondary | ICD-10-CM | POA: Diagnosis present

## 2022-08-31 DIAGNOSIS — R000 Tachycardia, unspecified: Secondary | ICD-10-CM | POA: Diagnosis present

## 2022-08-31 DIAGNOSIS — B20 Human immunodeficiency virus [HIV] disease: Secondary | ICD-10-CM | POA: Diagnosis present

## 2022-08-31 DIAGNOSIS — N19 Unspecified kidney failure: Secondary | ICD-10-CM | POA: Diagnosis present

## 2022-08-31 DIAGNOSIS — D631 Anemia in chronic kidney disease: Secondary | ICD-10-CM | POA: Diagnosis present

## 2022-08-31 DIAGNOSIS — R0603 Acute respiratory distress: Secondary | ICD-10-CM | POA: Diagnosis present

## 2022-08-31 DIAGNOSIS — J9600 Acute respiratory failure, unspecified whether with hypoxia or hypercapnia: Secondary | ICD-10-CM | POA: Diagnosis present

## 2022-08-31 DIAGNOSIS — I6930 Unspecified sequelae of cerebral infarction: Secondary | ICD-10-CM

## 2022-08-31 DIAGNOSIS — Z992 Dependence on renal dialysis: Secondary | ICD-10-CM | POA: Diagnosis not present

## 2022-08-31 DIAGNOSIS — E785 Hyperlipidemia, unspecified: Secondary | ICD-10-CM | POA: Diagnosis present

## 2022-08-31 DIAGNOSIS — Z91148 Patient's other noncompliance with medication regimen for other reason: Secondary | ICD-10-CM

## 2022-08-31 DIAGNOSIS — G40909 Epilepsy, unspecified, not intractable, without status epilepticus: Secondary | ICD-10-CM | POA: Diagnosis present

## 2022-08-31 DIAGNOSIS — E8889 Other specified metabolic disorders: Secondary | ICD-10-CM | POA: Diagnosis present

## 2022-08-31 DIAGNOSIS — I120 Hypertensive chronic kidney disease with stage 5 chronic kidney disease or end stage renal disease: Secondary | ICD-10-CM | POA: Diagnosis not present

## 2022-08-31 DIAGNOSIS — Z91158 Patient's noncompliance with renal dialysis for other reason: Secondary | ICD-10-CM

## 2022-08-31 DIAGNOSIS — Z8249 Family history of ischemic heart disease and other diseases of the circulatory system: Secondary | ICD-10-CM

## 2022-08-31 DIAGNOSIS — Z7902 Long term (current) use of antithrombotics/antiplatelets: Secondary | ICD-10-CM

## 2022-08-31 DIAGNOSIS — Z833 Family history of diabetes mellitus: Secondary | ICD-10-CM

## 2022-08-31 DIAGNOSIS — I161 Hypertensive emergency: Secondary | ICD-10-CM | POA: Diagnosis present

## 2022-08-31 DIAGNOSIS — Z79899 Other long term (current) drug therapy: Secondary | ICD-10-CM

## 2022-08-31 DIAGNOSIS — E559 Vitamin D deficiency, unspecified: Secondary | ICD-10-CM | POA: Diagnosis not present

## 2022-08-31 DIAGNOSIS — R531 Weakness: Secondary | ICD-10-CM | POA: Diagnosis not present

## 2022-08-31 DIAGNOSIS — R0902 Hypoxemia: Secondary | ICD-10-CM | POA: Diagnosis not present

## 2022-08-31 DIAGNOSIS — E8779 Other fluid overload: Principal | ICD-10-CM | POA: Diagnosis present

## 2022-08-31 DIAGNOSIS — R0602 Shortness of breath: Secondary | ICD-10-CM | POA: Diagnosis not present

## 2022-08-31 DIAGNOSIS — K219 Gastro-esophageal reflux disease without esophagitis: Secondary | ICD-10-CM | POA: Diagnosis present

## 2022-08-31 DIAGNOSIS — Z720 Tobacco use: Secondary | ICD-10-CM | POA: Diagnosis present

## 2022-08-31 DIAGNOSIS — I2489 Other forms of acute ischemic heart disease: Secondary | ICD-10-CM | POA: Diagnosis present

## 2022-08-31 DIAGNOSIS — Z8616 Personal history of COVID-19: Secondary | ICD-10-CM | POA: Diagnosis not present

## 2022-08-31 DIAGNOSIS — Z820 Family history of epilepsy and other diseases of the nervous system: Secondary | ICD-10-CM

## 2022-08-31 DIAGNOSIS — Z803 Family history of malignant neoplasm of breast: Secondary | ICD-10-CM

## 2022-08-31 DIAGNOSIS — J9601 Acute respiratory failure with hypoxia: Secondary | ICD-10-CM | POA: Diagnosis present

## 2022-08-31 DIAGNOSIS — Z8661 Personal history of infections of the central nervous system: Secondary | ICD-10-CM

## 2022-08-31 DIAGNOSIS — N250 Renal osteodystrophy: Secondary | ICD-10-CM | POA: Diagnosis not present

## 2022-08-31 DIAGNOSIS — Z823 Family history of stroke: Secondary | ICD-10-CM

## 2022-08-31 DIAGNOSIS — I132 Hypertensive heart and chronic kidney disease with heart failure and with stage 5 chronic kidney disease, or end stage renal disease: Secondary | ICD-10-CM | POA: Diagnosis present

## 2022-08-31 DIAGNOSIS — R0689 Other abnormalities of breathing: Secondary | ICD-10-CM | POA: Diagnosis not present

## 2022-08-31 DIAGNOSIS — I69351 Hemiplegia and hemiparesis following cerebral infarction affecting right dominant side: Secondary | ICD-10-CM

## 2022-08-31 DIAGNOSIS — R5383 Other fatigue: Secondary | ICD-10-CM | POA: Diagnosis not present

## 2022-08-31 DIAGNOSIS — I150 Renovascular hypertension: Secondary | ICD-10-CM | POA: Diagnosis not present

## 2022-08-31 DIAGNOSIS — K2100 Gastro-esophageal reflux disease with esophagitis, without bleeding: Secondary | ICD-10-CM | POA: Diagnosis not present

## 2022-08-31 LAB — I-STAT VENOUS BLOOD GAS, ED
Acid-base deficit: 6 mmol/L — ABNORMAL HIGH (ref 0.0–2.0)
Bicarbonate: 17.7 mmol/L — ABNORMAL LOW (ref 20.0–28.0)
Calcium, Ion: 0.78 mmol/L — CL (ref 1.15–1.40)
HCT: 34 % — ABNORMAL LOW (ref 39.0–52.0)
Hemoglobin: 11.6 g/dL — ABNORMAL LOW (ref 13.0–17.0)
O2 Saturation: 99 %
Potassium: 5.9 mmol/L — ABNORMAL HIGH (ref 3.5–5.1)
Sodium: 139 mmol/L (ref 135–145)
TCO2: 19 mmol/L — ABNORMAL LOW (ref 22–32)
pCO2, Ven: 28.3 mmHg — ABNORMAL LOW (ref 44–60)
pH, Ven: 7.404 (ref 7.25–7.43)
pO2, Ven: 137 mmHg — ABNORMAL HIGH (ref 32–45)

## 2022-08-31 LAB — I-STAT CHEM 8, ED
BUN: 64 mg/dL — ABNORMAL HIGH (ref 8–23)
Calcium, Ion: 0.79 mmol/L — CL (ref 1.15–1.40)
Chloride: 110 mmol/L (ref 98–111)
Creatinine, Ser: 11.4 mg/dL — ABNORMAL HIGH (ref 0.61–1.24)
Glucose, Bld: 133 mg/dL — ABNORMAL HIGH (ref 70–99)
HCT: 34 % — ABNORMAL LOW (ref 39.0–52.0)
Hemoglobin: 11.6 g/dL — ABNORMAL LOW (ref 13.0–17.0)
Potassium: 5.8 mmol/L — ABNORMAL HIGH (ref 3.5–5.1)
Sodium: 138 mmol/L (ref 135–145)
TCO2: 19 mmol/L — ABNORMAL LOW (ref 22–32)

## 2022-08-31 MED ORDER — NITROGLYCERIN IN D5W 200-5 MCG/ML-% IV SOLN
0.0000 ug/min | INTRAVENOUS | Status: DC
  Administered 2022-08-31: 5 ug/min via INTRAVENOUS
  Filled 2022-08-31: qty 250

## 2022-08-31 NOTE — ED Triage Notes (Signed)
Pt bib [facility] from [facility] where roommate called reporting increased SOB. Hx CHF, rales bilaterally, SPO2 89% RA, placed on CPAP and switched to NRB due to equipment malfunction. BP 250/130, given 2 nitros, last BP 220/116. Roommate covid + 12/1. Dialysis T,Th, Sat, missed last Saturday session. Hx stroke w R side weakness+ slurred speech at baseline  HR 117 RR 24 SPO2 100 NRB

## 2022-08-31 NOTE — ED Provider Notes (Signed)
[facility] EMERGENCY DEPARTMENT Provider Note   CSN: [id number] Arrival date & time: 08/31/22  2306     History {Add pertinent medical, surgical, social history, OB history to HPI:1} Chief Complaint  Patient presents with   Respiratory Distress    Richard [name] is a 66 y.o. male.  HPI Richard [name] is a 66 y.o. male who presents to the Emergency Department complaining of ***     Home Medications Prior to Admission medications   Medication Sig Start Date End Date Taking? Authorizing Provider  ACCU-CHEK AVIVA PLUS test strip USE 1 strip TO test blood sugar 2 TIMES DAILY TO 3 TIMES DAILY 06/10/19   [name], MD  acetaminophen (TYLENOL) 325 MG tablet Take 650 mg by mouth every 6 (six) hours as needed for moderate pain, fever or mild pain.    [provider]  b complex-vitamin c-folic acid (NEPHRO-VITE) 0.8 MG TABS tablet Take 1 tablet by mouth daily.    [provider]  blood glucose meter kit and supplies KIT Dispense based on patient and insurance preference. Check blood sugar 4 times a day 06/11/18   [name], MD  calcium acetate (PHOSLO) 667 MG capsule Take 2,001 mg by mouth 3 (three) times daily with meals.    [provider]  carvedilol (COREG) 12.5 MG tablet Take 12.5 mg by mouth 2 (two) times daily.    [provider]  cloNIDine (CATAPRES) 0.1 MG tablet Take 1 tablet (0.1 mg total) by mouth 3 (three) times daily. 02/21/21   Hosie Poisson, MD  clopidogrel (PLAVIX) 75 MG tablet Take 1 tablet (75 mg total) by mouth daily. 03/07/20   [name], MD  docusate sodium (COLACE) 100 MG capsule Take 100 mg by mouth daily.    [provider]  dolutegravir-lamiVUDine (DOVATO) 50-300 MG tablet Take 1 tablet by mouth daily. 03/19/22   Truman [place], MD  doxepin (SINEQUAN) 75 MG capsule Take 75 mg by mouth at bedtime.    [provider]  epoetin alfa-epbx (RETACRIT) 77939  UNIT/ML injection Inject 20,000 Units into the skin every Monday. Patient not taking: Reported on 08/13/2022    [provider]  Ergocalciferol 50 MCG (2000 UT) TABS Take 2,000 Units by mouth daily.    [provider]  hydrALAZINE (APRESOLINE) 10 MG tablet Take 10 mg by mouth 3 (three) times daily.    [provider]  hydrocortisone cream 1 % Apply 1 Application topically every 6 (six) hours as needed for itching. Apply to affected areas on back arms    [provider]  insulin glargine-yfgn (SEMGLEE) 100 UNIT/ML Pen Inject 5 Units into the skin at bedtime.    [provider]  Insulin Pen Needle (SURE COMFORT PEN NEEDLES) 31G X 8 MM MISC Use as directed twice daily with Novolog flex pen 02/10/19   Koberlein, Junell C, MD  levETIRAcetam (KEPPRA) 500 MG tablet Take 500 mg by mouth 2 (two) times daily.    [provider]  Lidocaine 3 % CREA Apply 1 application topically See admin instructions. Apply 1 times a day on Tuesday, Thursday and Saturday prior to dialysis    [provider]  Misc. Devices (COMMODE 3-IN-1) MISC Use as directed 02/24/20   [name], MD  Misc. Devices (TRANSFER BENCH) MISC Use as directed 02/24/20   Koberlein, [name], MD  Nutritional Supplements (FEEDING SUPPLEMENT, NEPRO CARB STEADY,) LIQD Take 237 mLs by mouth 3 (three)  times daily between meals.    [provider]  ondansetron (ZOFRAN) 4 MG tablet Take 4 mg by mouth every 8 (eight) hours as needed for vomiting or nausea. 12/09/21   [provider]  pantoprazole (PROTONIX) 40 MG tablet TAKE 1 TABLET BY MOUTH EVERY DAY Patient taking differently: Take 40 mg by mouth daily. 01/23/20   Koberlein, [name], MD  rosuvastatin (CRESTOR) 5 MG tablet Take 5 mg by mouth daily. 12/03/21   [provider]  sulfamethoxazole-trimethoprim (BACTRIM DS) 800-160 MG tablet Take 1 tablet by mouth 3 (three) times a week. Patient taking differently: Take 1  tablet by mouth every Monday, Wednesday, and Friday. Once daily on M-W F continuous 04/23/20   Little Ishikawa, MD  tamsulosin (FLOMAX) 0.4 MG CAPS capsule TAKE 1 CAPSULE BY MOUTH EVERY DAY Patient taking differently: Take 0.4 mg by mouth daily. 03/28/20   [name], MD  triamcinolone cream (KENALOG) 0.1 % Apply 1 Application topically in the morning and at bedtime. Apply to back and arms for rash twice a day 08/07/22   [provider]  Burns X 5/16" 1 ML MISC Use pen needle with insulin 2 times daily 03/31/19   [name], MD      Allergies    Oxycodone    Review of Systems   Review of Systems  Physical Exam Updated Vital Signs Temp 98.8 F (37.1 C) (Oral)  Physical Exam  ED Results / Procedures / Treatments   Labs (all labs ordered are listed, but only abnormal results are displayed) Labs Reviewed  RESP PANEL BY RT-PCR (RSV, FLU A&B, COVID)  RVPGX2  CULTURE, BLOOD (ROUTINE X 2)  CULTURE, BLOOD (ROUTINE X 2)  COMPREHENSIVE METABOLIC PANEL  CBC WITH DIFFERENTIAL/PLATELET  BRAIN NATRIURETIC PEPTIDE  LACTIC ACID, PLASMA  LACTIC ACID, PLASMA  I-STAT VENOUS BLOOD GAS, ED  I-STAT CHEM 8, ED  TROPONIN I (HIGH SENSITIVITY)    EKG None  Radiology No results found.  Procedures Procedures  {Document cardiac monitor, telemetry assessment procedure when appropriate:1}  Medications Ordered in ED Medications  nitroGLYCERIN 50 mg in dextrose 5 % 250 mL (0.2 mg/mL) infusion (has no administration in time range)    ED Course/ Medical Decision Making/ A&P                           Medical Decision Making Amount and/or Complexity of Data Reviewed Labs: ordered. Radiology: ordered.  Risk Prescription drug management.   ***  {Document critical care time when appropriate:1} {Document review of labs and clinical decision tools ie heart score, Chads2Vasc2 etc:1}  {Document your independent review of radiology images, and any  outside records:1} {Document your discussion with family members, caretakers, and with consultants:1} {Document social determinants of health affecting pt's care:1} {Document your decision making why or why not admission, treatments were needed:1} Final Clinical Impression(s) / ED Diagnoses Final diagnoses:  None    Rx / DC Orders ED Discharge Orders     None

## 2022-08-31 NOTE — ED Notes (Signed)
2 Rns + MD attempted IV once each, unsuccessful. Lactic and blood cultures unable to be obtained, pt refusing to be stuck again for labs

## 2022-09-01 ENCOUNTER — Encounter (HOSPITAL_COMMUNITY): Payer: Self-pay

## 2022-09-01 ENCOUNTER — Observation Stay (HOSPITAL_COMMUNITY): Payer: Medicare HMO

## 2022-09-01 DIAGNOSIS — N186 End stage renal disease: Secondary | ICD-10-CM

## 2022-09-01 DIAGNOSIS — R7989 Other specified abnormal findings of blood chemistry: Secondary | ICD-10-CM

## 2022-09-01 DIAGNOSIS — I161 Hypertensive emergency: Secondary | ICD-10-CM

## 2022-09-01 DIAGNOSIS — Z992 Dependence on renal dialysis: Secondary | ICD-10-CM

## 2022-09-01 DIAGNOSIS — J9601 Acute respiratory failure with hypoxia: Secondary | ICD-10-CM

## 2022-09-01 LAB — CBC
HCT: 25.2 % — ABNORMAL LOW (ref 39.0–52.0)
Hemoglobin: 8.4 g/dL — ABNORMAL LOW (ref 13.0–17.0)
MCH: 33.2 pg (ref 26.0–34.0)
MCHC: 33.3 g/dL (ref 30.0–36.0)
MCV: 99.6 fL (ref 80.0–100.0)
Platelets: 189 10*3/uL (ref 150–400)
RBC: 2.53 MIL/uL — ABNORMAL LOW (ref 4.22–5.81)
RDW: 16 % — ABNORMAL HIGH (ref 11.5–15.5)
WBC: 7.8 10*3/uL (ref 4.0–10.5)
nRBC: 0.3 % — ABNORMAL HIGH (ref 0.0–0.2)

## 2022-09-01 LAB — CBC WITH DIFFERENTIAL/PLATELET
Abs Immature Granulocytes: 0.08 10*3/uL — ABNORMAL HIGH (ref 0.00–0.07)
Basophils Absolute: 0.1 10*3/uL (ref 0.0–0.1)
Basophils Relative: 1 %
Eosinophils Absolute: 0.5 10*3/uL (ref 0.0–0.5)
Eosinophils Relative: 4 %
HCT: 33.3 % — ABNORMAL LOW (ref 39.0–52.0)
Hemoglobin: 10.9 g/dL — ABNORMAL LOW (ref 13.0–17.0)
Immature Granulocytes: 1 %
Lymphocytes Relative: 10 %
Lymphs Abs: 1.6 10*3/uL (ref 0.7–4.0)
MCH: 33 pg (ref 26.0–34.0)
MCHC: 32.7 g/dL (ref 30.0–36.0)
MCV: 100.9 fL — ABNORMAL HIGH (ref 80.0–100.0)
Monocytes Absolute: 1.2 10*3/uL — ABNORMAL HIGH (ref 0.1–1.0)
Monocytes Relative: 8 %
Neutro Abs: 11.8 10*3/uL — ABNORMAL HIGH (ref 1.7–7.7)
Neutrophils Relative %: 76 %
Platelets: ADEQUATE 10*3/uL (ref 150–400)
RBC: 3.3 MIL/uL — ABNORMAL LOW (ref 4.22–5.81)
RDW: 16.6 % — ABNORMAL HIGH (ref 11.5–15.5)
WBC: 15.2 10*3/uL — ABNORMAL HIGH (ref 4.0–10.5)
nRBC: 0.3 % — ABNORMAL HIGH (ref 0.0–0.2)

## 2022-09-01 LAB — COMPREHENSIVE METABOLIC PANEL
ALT: 12 U/L (ref 0–44)
AST: 17 U/L (ref 15–41)
Albumin: 2.6 g/dL — ABNORMAL LOW (ref 3.5–5.0)
Alkaline Phosphatase: 89 U/L (ref 38–126)
Anion gap: 16 — ABNORMAL HIGH (ref 5–15)
BUN: 44 mg/dL — ABNORMAL HIGH (ref 8–23)
CO2: 22 mmol/L (ref 22–32)
Calcium: 7.5 mg/dL — ABNORMAL LOW (ref 8.9–10.3)
Chloride: 104 mmol/L (ref 98–111)
Creatinine, Ser: 10.86 mg/dL — ABNORMAL HIGH (ref 0.61–1.24)
GFR, Estimated: 5 mL/min — ABNORMAL LOW (ref >60)
Glucose, Bld: 110 mg/dL — ABNORMAL HIGH (ref 70–99)
Potassium: 4.8 mmol/L (ref 3.5–5.1)
Sodium: 142 mmol/L (ref 135–145)
Total Bilirubin: 0.5 mg/dL (ref 0.3–1.2)
Total Protein: 6.6 g/dL (ref 6.5–8.1)

## 2022-09-01 LAB — CBG MONITORING, ED
Glucose-Capillary: 78 mg/dL (ref 70–99)
Glucose-Capillary: 74 mg/dL (ref 70–99)

## 2022-09-01 LAB — ECHOCARDIOGRAM COMPLETE
Area-P 1/2: 6.17 cm2
Calc EF: 44 %
Height: 72 in
S' Lateral: 3.7 cm
Single Plane A2C EF: 47.4 %
Single Plane A4C EF: 41.1 %
Weight: 2712.54 oz

## 2022-09-01 LAB — RESP PANEL BY RT-PCR (RSV, FLU A&B, COVID)  RVPGX2
Influenza A by PCR: NEGATIVE
Influenza B by PCR: NEGATIVE
Resp Syncytial Virus by PCR: NEGATIVE
SARS Coronavirus 2 by RT PCR: NEGATIVE

## 2022-09-01 LAB — GLUCOSE, CAPILLARY: Glucose-Capillary: 96 mg/dL (ref 70–99)

## 2022-09-01 LAB — TROPONIN I (HIGH SENSITIVITY)
Troponin I (High Sensitivity): 223 ng/L (ref <18)
Troponin I (High Sensitivity): 544 ng/L (ref <18)
Troponin I (High Sensitivity): 88 ng/L — ABNORMAL HIGH (ref <18)

## 2022-09-01 LAB — BRAIN NATRIURETIC PEPTIDE: B Natriuretic Peptide: 2046.1 pg/mL — ABNORMAL HIGH (ref 0.0–100.0)

## 2022-09-01 LAB — LACTIC ACID, PLASMA
Lactic Acid, Venous: 1.1 mmol/L (ref 0.5–1.9)
Lactic Acid, Venous: 1.1 mmol/L (ref 0.5–1.9)

## 2022-09-01 LAB — HEPATITIS B SURFACE ANTIGEN: Hepatitis B Surface Ag: NONREACTIVE

## 2022-09-01 MED ORDER — SODIUM CHLORIDE 0.9% FLUSH: 3.0000 mL | INTRAVENOUS | Status: DC | PRN

## 2022-09-01 MED ORDER — LIDOCAINE HCL (PF) 1 % IJ SOLN: 5.0000 mL | INTRAMUSCULAR | Status: DC | PRN

## 2022-09-01 MED ORDER — PENTAFLUOROPROP-TETRAFLUOROETH EX AERO: 1.0000 | INHALATION_SPRAY | CUTANEOUS | Status: DC | PRN

## 2022-09-01 MED ORDER — CHLORHEXIDINE GLUCONATE CLOTH 2 % EX PADS: 6.0000 | MEDICATED_PAD | Freq: Every day | CUTANEOUS | Status: DC

## 2022-09-01 MED ORDER — DOLUTEGRAVIR-LAMIVUDINE 50-300 MG PO TABS
1.0000 | ORAL_TABLET | Freq: Every day | ORAL | Status: DC
  Administered 2022-09-03: 1 via ORAL
  Filled 2022-09-01 (×5): qty 1

## 2022-09-01 MED ORDER — TAMSULOSIN HCL 0.4 MG PO CAPS
0.4000 mg | ORAL_CAPSULE | Freq: Every day | ORAL | Status: DC
  Administered 2022-09-02 – 2022-09-05 (×4): 0.4 mg via ORAL
  Filled 2022-09-01 (×4): qty 1

## 2022-09-01 MED ORDER — EPOETIN ALFA-EPBX 20000 UNIT/ML IJ SOLN: 20000.0000 [IU] | INTRAMUSCULAR | Status: DC

## 2022-09-01 MED ORDER — CARVEDILOL 12.5 MG PO TABS
12.5000 mg | ORAL_TABLET | Freq: Once | ORAL | Status: AC
  Administered 2022-09-01: 12.5 mg via ORAL
  Filled 2022-09-01: qty 1

## 2022-09-01 MED ORDER — CARVEDILOL 12.5 MG PO TABS
12.5000 mg | ORAL_TABLET | Freq: Two times a day (BID) | ORAL | Status: DC
  Administered 2022-09-01 – 2022-09-05 (×7): 12.5 mg via ORAL
  Filled 2022-09-01 (×8): qty 1

## 2022-09-01 MED ORDER — DARBEPOETIN ALFA 100 MCG/0.5ML IJ SOSY
100.0000 ug | PREFILLED_SYRINGE | INTRAMUSCULAR | Status: DC
  Administered 2022-09-01: 100 ug via SUBCUTANEOUS
  Filled 2022-09-01: qty 0.5

## 2022-09-01 MED ORDER — HYDRALAZINE HCL 20 MG/ML IJ SOLN: 10.0000 mg | Freq: Three times a day (TID) | INTRAMUSCULAR | Status: DC | PRN

## 2022-09-01 MED ORDER — CLONIDINE HCL 0.1 MG PO TABS
0.1000 mg | ORAL_TABLET | Freq: Once | ORAL | Status: AC
  Administered 2022-09-01: 0.1 mg via ORAL
  Filled 2022-09-01: qty 1

## 2022-09-01 MED ORDER — HYDRALAZINE HCL 10 MG PO TABS
10.0000 mg | ORAL_TABLET | Freq: Three times a day (TID) | ORAL | Status: DC
  Administered 2022-09-01 – 2022-09-06 (×12): 10 mg via ORAL
  Filled 2022-09-01 (×14): qty 1

## 2022-09-01 MED ORDER — ONDANSETRON HCL 4 MG/2ML IJ SOLN
4.0000 mg | Freq: Four times a day (QID) | INTRAMUSCULAR | Status: DC | PRN
  Administered 2022-09-01: 4 mg via INTRAVENOUS

## 2022-09-01 MED ORDER — INSULIN GLARGINE-YFGN 100 UNIT/ML ~~LOC~~ SOLN
5.0000 [IU] | Freq: Every day | SUBCUTANEOUS | Status: DC
  Filled 2022-09-01 (×6): qty 0.05

## 2022-09-01 MED ORDER — HEPARIN SODIUM (PORCINE) 1000 UNIT/ML DIALYSIS
2000.0000 [IU] | Freq: Once | INTRAMUSCULAR | Status: AC
  Administered 2022-09-01: 2000 [IU] via INTRAVENOUS_CENTRAL

## 2022-09-01 MED ORDER — ACETAMINOPHEN 325 MG PO TABS: 650.0000 mg | ORAL_TABLET | Freq: Four times a day (QID) | ORAL | Status: DC | PRN

## 2022-09-01 MED ORDER — PANTOPRAZOLE SODIUM 40 MG PO TBEC
40.0000 mg | DELAYED_RELEASE_TABLET | Freq: Every day | ORAL | Status: DC
  Administered 2022-09-02 – 2022-09-05 (×4): 40 mg via ORAL
  Filled 2022-09-01 (×4): qty 1

## 2022-09-01 MED ORDER — LIDOCAINE-PRILOCAINE 2.5-2.5 % EX CREA: 1.0000 | TOPICAL_CREAM | CUTANEOUS | Status: DC | PRN

## 2022-09-01 MED ORDER — DOCUSATE SODIUM 100 MG PO CAPS
100.0000 mg | ORAL_CAPSULE | Freq: Every day | ORAL | Status: DC
  Administered 2022-09-02 – 2022-09-04 (×2): 100 mg via ORAL
  Filled 2022-09-01 (×4): qty 1

## 2022-09-01 MED ORDER — ANTICOAGULANT SODIUM CITRATE 4% (200MG/5ML) IV SOLN
5.0000 mL | Status: DC | PRN
  Filled 2022-09-01: qty 5

## 2022-09-01 MED ORDER — CALCIUM ACETATE (PHOS BINDER) 667 MG PO CAPS
2001.0000 mg | ORAL_CAPSULE | Freq: Three times a day (TID) | ORAL | Status: DC
  Administered 2022-09-01 – 2022-09-05 (×7): 2001 mg via ORAL
  Filled 2022-09-01 (×10): qty 3

## 2022-09-01 MED ORDER — ROSUVASTATIN CALCIUM 5 MG PO TABS
5.0000 mg | ORAL_TABLET | Freq: Every day | ORAL | Status: DC
  Administered 2022-09-02 – 2022-09-05 (×4): 5 mg via ORAL
  Filled 2022-09-01 (×4): qty 1

## 2022-09-01 MED ORDER — SULFAMETHOXAZOLE-TRIMETHOPRIM 800-160 MG PO TABS
1.0000 | ORAL_TABLET | ORAL | Status: DC
  Administered 2022-09-03: 1 via ORAL
  Filled 2022-09-01: qty 1

## 2022-09-01 MED ORDER — SODIUM CHLORIDE 0.9% FLUSH
3.0000 mL | Freq: Two times a day (BID) | INTRAVENOUS | Status: DC
  Administered 2022-09-01 – 2022-09-05 (×6): 3 mL via INTRAVENOUS

## 2022-09-01 MED ORDER — HEPARIN SODIUM (PORCINE) 5000 UNIT/ML IJ SOLN
5000.0000 [IU] | Freq: Three times a day (TID) | INTRAMUSCULAR | Status: DC
  Administered 2022-09-01 – 2022-09-05 (×8): 5000 [IU] via SUBCUTANEOUS
  Filled 2022-09-01 (×12): qty 1

## 2022-09-01 MED ORDER — ALTEPLASE 2 MG IJ SOLR: 2.0000 mg | Freq: Once | INTRAMUSCULAR | Status: DC | PRN

## 2022-09-01 MED ORDER — NEPRO/CARBSTEADY PO LIQD
237.0000 mL | Freq: Three times a day (TID) | ORAL | Status: DC
  Administered 2022-09-02 – 2022-09-05 (×4): 237 mL via ORAL
  Filled 2022-09-01: qty 237

## 2022-09-01 MED ORDER — HEPARIN SODIUM (PORCINE) 1000 UNIT/ML DIALYSIS
1000.0000 [IU] | INTRAMUSCULAR | Status: DC | PRN
  Administered 2022-09-02: 1000 [IU]
  Filled 2022-09-01: qty 1

## 2022-09-01 MED ORDER — INSULIN ASPART 100 UNIT/ML IJ SOLN: 0.0000 [IU] | INTRAMUSCULAR | Status: DC

## 2022-09-01 MED ORDER — ACETAMINOPHEN 650 MG RE SUPP: 650.0000 mg | Freq: Four times a day (QID) | RECTAL | Status: DC | PRN

## 2022-09-01 MED ORDER — HEPARIN SODIUM (PORCINE) 1000 UNIT/ML IJ SOLN
INTRAMUSCULAR | Status: AC
  Filled 2022-09-01: qty 6

## 2022-09-01 MED ORDER — CLOPIDOGREL BISULFATE 75 MG PO TABS
75.0000 mg | ORAL_TABLET | Freq: Every day | ORAL | Status: DC
  Administered 2022-09-02 – 2022-09-05 (×4): 75 mg via ORAL
  Filled 2022-09-01 (×4): qty 1

## 2022-09-01 MED ORDER — DOXEPIN HCL 25 MG PO CAPS
75.0000 mg | ORAL_CAPSULE | Freq: Every day | ORAL | Status: DC
  Administered 2022-09-01 – 2022-09-06 (×6): 75 mg via ORAL
  Filled 2022-09-01 (×4): qty 3
  Filled 2022-09-01: qty 1
  Filled 2022-09-01 (×2): qty 3

## 2022-09-01 MED ORDER — CLONIDINE HCL 0.1 MG PO TABS
0.1000 mg | ORAL_TABLET | Freq: Three times a day (TID) | ORAL | Status: DC
  Administered 2022-09-01 – 2022-09-06 (×11): 0.1 mg via ORAL
  Filled 2022-09-01 (×12): qty 1

## 2022-09-01 MED ORDER — CHLORHEXIDINE GLUCONATE CLOTH 2 % EX PADS
6.0000 | MEDICATED_PAD | Freq: Every day | CUTANEOUS | Status: DC
  Administered 2022-09-02 – 2022-09-03 (×2): 6 via TOPICAL

## 2022-09-01 MED ORDER — SODIUM CHLORIDE 0.9 % IV SOLN: 250.0000 mL | INTRAVENOUS | Status: DC | PRN

## 2022-09-01 MED ORDER — RENA-VITE PO TABS
1.0000 | ORAL_TABLET | Freq: Every day | ORAL | Status: DC
  Administered 2022-09-01 – 2022-09-06 (×6): 1 via ORAL
  Filled 2022-09-01 (×7): qty 1

## 2022-09-01 MED ORDER — LEVETIRACETAM 500 MG PO TABS
500.0000 mg | ORAL_TABLET | Freq: Two times a day (BID) | ORAL | Status: DC
  Administered 2022-09-01 – 2022-09-06 (×10): 500 mg via ORAL
  Filled 2022-09-01 (×10): qty 1

## 2022-09-01 NOTE — Assessment & Plan Note (Addendum)
Continue blood pressure control, statin and clopidogrel.  Positive chronic right sided hemiparesis.

## 2022-09-01 NOTE — Assessment & Plan Note (Signed)
Declines nicotine patch  

## 2022-09-01 NOTE — Assessment & Plan Note (Signed)
Troponin [phone] in setting of HTN emergency and pulmonary edema with respiratory distress/failure  He has had no chest pain or changes on EKG Likely demand ischemia in setting of HTN emergency and acute pulmonary edema Continue to trend troponin Echo pending

## 2022-09-01 NOTE — ED Notes (Signed)
Pt taken off of BIPAP, respirations even and unlabored, able to speak in full sentences. MD aware

## 2022-09-01 NOTE — ED Notes (Signed)
Ralene Bathe MD made aware of pts critical troponin 544

## 2022-09-01 NOTE — Assessment & Plan Note (Signed)
Continue protonix daily. 

## 2022-09-01 NOTE — Consult Note (Addendum)
Reason for Consult: ESRD Referring Physician:  Dr. Ruthann Cancer  Chief Complaint:  Shortness of breath  Dialysis orders @ [facility] TTS 4hr 400/800 2/2 bath UFP4 Hectorol 46mg qtx Heparin 1600 bolus Mircera 200 q2weeks (given 12/7)  Assessment/Plan: 1 Acute hypoxic RF: severely underdialyzed and volume overload with EDW listed at 70kg but with all the missed and truncated treatments difficult to determine true EDW.  Needs HD today which we discussed and orders placed; will dialyze 1st shift.   Exposure for COVID as well with roommate recently testing positive but patient is asymptomatic with no cough, fevers, sore throat, myalgias.  Additional recommendations - Dose all meds for creatinine clearance < 10 ml/min  - Unless absolutely necessary, no MRIs with gadolinium.  - Prefer needle sticks in the dorsum of the hands or wrists.  No blood pressure measurements in right arm. - If blood transfusion is requested during hemodialysis sessions, please alert uKoreaprior to the session.  - If a hemodialysis catheter line culture is requested, please alert uKoreaas only hemodialysis nurses are able to collect those specimens.   2 ESRD: TTS- chronically underdialyzed, needs HD today and orders are in 3 Hypertension: chronically hypertensive, he becomes symptomatic around SBP of 130 4. Anemia of ESRD:  on max ESA as OP- last given 101/0 5. Metabolic Bone Disease:  binders and vits when eating 6.  HIV: meds per primary 7.  H/o stroke: R sided weakness and h/o slurred speech 8.  Dispo: pending   HPI: Richard Jedlickais an 66y.o. male DM, HIV, HTN, diabetic neuropathy, frequent falls with h/o SAH as well previously in 10/2021, CVA w/ right sided weakness, [facility] presenting with acute hypoxic respiratory failure from volume overload.  He complained of increasing shortness of breath but denies fever, chills, chest pain. Roommate recently tested positive for COVID19 on December 1  from his nursing facility. WIth sats of 89% on RA he was placed on [facility] O2 and then subsequently BiPAP for volume overload. BP noted to be initially 250/130 started on nitro gtt.  K was in the 4s.  CXR showed cardiomegaly with vascular congestion and possible central perihilar. History of nonadherent behavior with shortened treatments regularly and multiple missed treatments. Patient's last outpatient treatment was on Thursday 12/7 and he only was treated for 1hr 557m leaving at 81.2 kg (2.3L UF). Prior to that his last outpatient treatments were on 11/16 and 11/28 with treatment times of 2hr21 and 2hr07.  He was hospitalized for similar complaints and [facility] with much better BP control and volume status.   ROS Pertinent items are noted in HPI.  Chemistry and CBC: Creatinine, Ser  Date/Time Value Ref Range Status  09/01/2022 01:51 AM 10.86 (H) 0.61 - 1.24 mg/dL Final  08/31/2022 11:52 PM 11.40 (H) 0.61 - 1.24 mg/dL Final  08/13/2022 02:52 PM 10.97 (H) 0.61 - 1.24 mg/dL Final  07/23/2022 02:08 AM 7.63 (H) 0.61 - 1.24 mg/dL Final  07/03/2022 01:31 AM 13.90 (H) 0.61 - 1.24 mg/dL Final  07/03/2022 01:11 AM 12.71 (H) 0.61 - 1.24 mg/dL Final  06/19/2022 02:03 AM 13.35 (H) 0.61 - 1.24 mg/dL Final  06/18/2022 12:57 AM 12.54 (H) 0.61 - 1.24 mg/dL Final  02/28/2022 07:00 AM 9.30 (H) 0.61 - 1.24 mg/dL Final  12/19/2021 08:05 PM 15.67 (H) 0.61 - 1.24 mg/dL Final  11/04/2021 06:59 AM 15.70 (H) 0.61 - 1.24 mg/dL Final  11/04/2021 06:16 AM 14.81 (H)  0.61 - 1.24 mg/dL Final  10/22/2021 07:04 AM 12.63 (H) 0.61 - 1.24 mg/dL Final  10/21/2021 05:44 AM 11.78 (H) 0.61 - 1.24 mg/dL Final  10/20/2021 09:15 AM 11.09 (H) 0.61 - 1.24 mg/dL Final  10/19/2021 01:00 PM 9.60 (H) 0.61 - 1.24 mg/dL Final  04/14/2021 04:56 AM 14.40 (H) 0.61 - 1.24 mg/dL Final  04/14/2021 04:00 AM 14.03 (H) 0.61 - 1.24 mg/dL Final  04/02/2021 10:15 AM 13.58 (H) 0.61 - 1.24 mg/dL Final  04/02/2021 06:57 AM 15.60 (H) 0.61  - 1.24 mg/dL Final  04/02/2021 06:31 AM 13.58 (H) 0.61 - 1.24 mg/dL Final  02/21/2021 07:01 AM 6.97 (H) 0.61 - 1.24 mg/dL Final  02/19/2021 06:27 AM 7.27 (H) 0.61 - 1.24 mg/dL Final  02/17/2021 11:40 AM 10.37 (H) 0.61 - 1.24 mg/dL Final  02/17/2021 05:23 AM 10.16 (H) 0.61 - 1.24 mg/dL Final  01/17/2021 11:38 AM 10.61 (H) 0.61 - 1.24 mg/dL Final  01/14/2021 01:15 AM 9.13 (H) 0.61 - 1.24 mg/dL Final  01/13/2021 12:47 AM 7.21 (H) 0.61 - 1.24 mg/dL Final  01/12/2021 02:01 AM 9.49 (H) 0.61 - 1.24 mg/dL Final    Comment:    DELTA CHECK NOTED  01/11/2021 12:18 AM 15.22 (H) 0.61 - 1.24 mg/dL Final    Comment:    DELTA CHECK NOTED  01/10/2021 12:55 PM 24.82 (H) 0.61 - 1.24 mg/dL Final  08/09/2020 04:54 AM 9.44 (H) 0.61 - 1.24 mg/dL Final  06/02/2020 08:35 AM 8.39 (H) 0.61 - 1.24 mg/dL Final  06/01/2020 04:36 AM 8.50 (H) 0.61 - 1.24 mg/dL Final  06/01/2020 04:30 AM 8.21 (H) 0.61 - 1.24 mg/dL Final  05/03/2020 11:42 AM 4.92 (H) 0.61 - 1.24 mg/dL Final  05/01/2020 07:23 AM 6.66 (H) 0.61 - 1.24 mg/dL Final  04/28/2020 02:22 PM 7.51 (H) 0.61 - 1.24 mg/dL Final  04/27/2020 03:30 AM 5.25 (H) 0.61 - 1.24 mg/dL Final  04/26/2020 06:38 AM 7.57 (H) 0.61 - 1.24 mg/dL Final  04/25/2020 03:04 AM 6.27 (H) 0.61 - 1.24 mg/dL Final  04/24/2020 03:46 AM 9.03 (H) 0.61 - 1.24 mg/dL Final  04/23/2020 01:30 AM 7.80 (H) 0.61 - 1.24 mg/dL Final  04/22/2020 02:22 AM 6.20 (H) 0.61 - 1.24 mg/dL Final  04/21/2020 09:06 PM 5.63 (H) 0.61 - 1.24 mg/dL Final  04/21/2020 02:58 AM 4.36 (H) 0.61 - 1.24 mg/dL Final  04/20/2020 09:58 AM 5.77 (H) 0.61 - 1.24 mg/dL Final  04/18/2020 03:43 AM 5.79 (H) 0.61 - 1.24 mg/dL Final  04/16/2020 07:40 AM 6.56 (H) 0.61 - 1.24 mg/dL Final  04/14/2020 11:37 AM 7.74 (H) 0.61 - 1.24 mg/dL Final  04/13/2020 11:13 AM 11.51 (H) 0.61 - 1.24 mg/dL Final  04/12/2020 04:34 AM 10.66 (H) 0.61 - 1.24 mg/dL Final   Recent Labs  Lab 08/31/22 2352 08/31/22 2353 09/01/22 0151  NA 138 139 142  K 5.8*  5.9* 4.8  CL 110  --  104  CO2  --   --  22  GLUCOSE 133*  --  110*  BUN 64*  --  44*  CREATININE 11.40*  --  10.86*  CALCIUM  --   --  7.5*   Recent Labs  Lab 08/31/22 2342 08/31/22 2352 08/31/22 2353  WBC 15.2*  --   --   NEUTROABS 11.8*  --   --   HGB 10.9* 11.6* 11.6*  HCT 33.3* 34.0* 34.0*  MCV 100.9*  --   --   PLT PLATELET CLUMPS NOTED ON SMEAR, COUNT APPEARS ADEQUATE  --   --  Liver Function Tests: Recent Labs  Lab 09/01/22 0151  AST 17  ALT 12  ALKPHOS 89  BILITOT 0.5  PROT 6.6  ALBUMIN 2.6*   No results for input(s): "LIPASE", "AMYLASE" in the last 168 hours. No results for input(s): "AMMONIA" in the last 168 hours. Cardiac Enzymes: No results for input(s): "CKTOTAL", "CKMB", "CKMBINDEX", "TROPONINI" in the last 168 hours. Iron Studies: No results for input(s): "IRON", "TIBC", "TRANSFERRIN", "FERRITIN" in the last 72 hours. PT/INR: _0 (inr:5)  Xrays/Other Studies: ) Results for orders placed or performed during the hospital encounter of 08/31/22 (from the past 48 hour(s))  Resp panel by RT-PCR (RSV, Flu A&B, Covid) Anterior Nasal Swab     Status: None   Collection Time: 08/31/22 11:13 PM   Specimen: Anterior Nasal Swab  Result Value Ref Range   SARS Coronavirus 2 by RT PCR NEGATIVE NEGATIVE    Comment: (NOTE) SARS-CoV-2 target nucleic acids are NOT DETECTED.  The SARS-CoV-2 RNA is generally detectable in upper respiratory specimens during the acute phase of infection. The lowest concentration of SARS-CoV-2 viral copies this assay can detect is 138 copies/mL. A negative result does not preclude SARS-Cov-2 infection and should not be used as the sole basis for treatment or other patient management decisions. A negative result may occur with  improper specimen collection/handling, submission of specimen other than nasopharyngeal swab, presence of viral mutation(s) within the areas targeted by this assay, and inadequate number of  viral copies(<138 copies/mL). A negative result must be combined with clinical observations, patient history, and epidemiological information. The expected result is Negative.  Fact Sheet for Patients:  EntrepreneurPulse.com.au  Fact Sheet for Healthcare Providers:  IncredibleEmployment.be  This test is no t yet approved or cleared by the [place] FDA and  has been authorized for detection and/or diagnosis of SARS-CoV-2 by FDA under an Emergency Use Authorization (EUA). This EUA will remain  in effect (meaning this test can be used) for the duration of the COVID-19 declaration under Section 564(b)(1) of the Act, 21 U.S.C.section 360bbb-3(b)(1), unless the authorization is terminated  or revoked sooner.       Influenza A by PCR NEGATIVE NEGATIVE   Influenza B by PCR NEGATIVE NEGATIVE    Comment: (NOTE) The Xpert Xpress SARS-CoV-2/FLU/RSV plus assay is intended as an aid in the diagnosis of influenza from Nasopharyngeal swab specimens and should not be used as a sole basis for treatment. Nasal washings and aspirates are unacceptable for Xpert Xpress SARS-CoV-2/FLU/RSV testing.  Fact Sheet for Patients: EntrepreneurPulse.com.au  Fact Sheet for Healthcare Providers: IncredibleEmployment.be  This test is not yet approved or cleared by the [place] FDA and has been authorized for detection and/or diagnosis of SARS-CoV-2 by FDA under an Emergency Use Authorization (EUA). This EUA will remain in effect (meaning this test can be used) for the duration of the COVID-19 declaration under Section 564(b)(1) of the Act, 21 U.S.C. section 360bbb-3(b)(1), unless the authorization is terminated or revoked.     Resp Syncytial Virus by PCR NEGATIVE NEGATIVE    Comment: (NOTE) Fact Sheet for Patients: EntrepreneurPulse.com.au  Fact Sheet for Healthcare  Providers: IncredibleEmployment.be  This test is not yet approved or cleared by the [place] FDA and has been authorized for detection and/or diagnosis of SARS-CoV-2 by FDA under an Emergency Use Authorization (EUA). This EUA will remain in effect (meaning this test can be used) for the duration of the COVID-19 declaration under Section 564(b)(1) of the Act, 21 U.S.C. section 360bbb-3(b)(1), unless the authorization is  terminated or revoked.  Performed at [facility], [place] [address], [place], [place] [address]   CBC with Differential     Status: Abnormal   Collection Time: 08/31/22 11:42 PM  Result Value Ref Range   WBC 15.2 (H) 4.0 - 10.5 K/uL   RBC 3.30 (L) 4.22 - 5.81 MIL/uL   Hemoglobin 10.9 (L) 13.0 - 17.0 g/dL   HCT 33.3 (L) 39.0 - 52.0 %   MCV 100.9 (H) 80.0 - 100.0 fL   MCH 33.0 26.0 - 34.0 pg   MCHC 32.7 30.0 - 36.0 g/dL   RDW 16.6 (H) 11.5 - 15.5 %   Platelets  150 - 400 K/uL    PLATELET CLUMPS NOTED ON SMEAR, COUNT APPEARS ADEQUATE   nRBC 0.3 (H) 0.0 - 0.2 %   Neutrophils Relative % 76 %   Neutro Abs 11.8 (H) 1.7 - 7.7 K/uL   Lymphocytes Relative 10 %   Lymphs Abs 1.6 0.7 - 4.0 K/uL   Monocytes Relative 8 %   Monocytes Absolute 1.2 (H) 0.1 - 1.0 K/uL   Eosinophils Relative 4 %   Eosinophils Absolute 0.5 0.0 - 0.5 K/uL   Basophils Relative 1 %   Basophils Absolute 0.1 0.0 - 0.1 K/uL   Immature Granulocytes 1 %   Abs Immature Granulocytes 0.08 (H) 0.00 - 0.07 K/uL   Polychromasia PRESENT     Comment: Performed at [facility], [place] [address] [place], [place] [address]  Troponin I (High Sensitivity)     Status: Abnormal   Collection Time: 08/31/22 11:42 PM  Result Value Ref Range   Troponin I (High Sensitivity) 88 (H) <18 ng/L    Comment: (NOTE) Elevated high sensitivity troponin I (hsTnI) values and significant  changes across serial measurements may suggest ACS but many other  chronic and acute conditions are known to  elevate hsTnI results.  Refer to the "Links" section for chest pain algorithms and additional  guidance. Performed at [facility], [place] [address], [place], [place] [address]   Brain natriuretic peptide     Status: Abnormal   Collection Time: 08/31/22 11:42 PM  Result Value Ref Range   B Natriuretic Peptide 2,046.1 (H) 0.0 - 100.0 pg/mL    Comment: Performed at [facility] [address] [place], [place] [address]  I-stat chem 8, ED     Status: Abnormal   Collection Time: 08/31/22 11:52 PM  Result Value Ref Range   Sodium 138 135 - 145 mmol/L   Potassium 5.8 (H) 3.5 - 5.1 mmol/L   Chloride 110 98 - 111 mmol/L   BUN 64 (H) 8 - 23 mg/dL   Creatinine, Ser 11.40 (H) 0.61 - 1.24 mg/dL   Glucose, Bld 133 (H) 70 - 99 mg/dL    Comment: Glucose reference range applies only to samples taken after fasting for at least 8 hours.   Calcium, Ion 0.79 (LL) 1.15 - 1.40 mmol/L   TCO2 19 (L) 22 - 32 mmol/L   Hemoglobin 11.6 (L) 13.0 - 17.0 g/dL   HCT 34.0 (L) 39.0 - 52.0 %   Comment NOTIFIED PHYSICIAN   I-Stat venous blood gas, ED     Status: Abnormal   Collection Time: 08/31/22 11:53 PM  Result Value Ref Range   pH, Ven 7.404 7.25 - 7.43   pCO2, Ven 28.3 (L) 44 - 60 mmHg   pO2, Ven 137 (H) 32 - 45 mmHg   Bicarbonate 17.7 (L) 20.0 -  28.0 mmol/L   TCO2 19 (L) 22 - 32 mmol/L   O2 Saturation 99 %   Acid-base deficit 6.0 (H) 0.0 - 2.0 mmol/L   Sodium 139 135 - 145 mmol/L   Potassium 5.9 (H) 3.5 - 5.1 mmol/L   Calcium, Ion 0.78 (LL) 1.15 - 1.40 mmol/L   HCT 34.0 (L) 39.0 - 52.0 %   Hemoglobin 11.6 (L) 13.0 - 17.0 g/dL   Sample type VENOUS    Comment NOTIFIED PHYSICIAN   Lactic acid, plasma     Status: None   Collection Time: 09/01/22  1:22 AM  Result Value Ref Range   Lactic Acid, Venous 1.1 0.5 - 1.9 mmol/L    Comment: Performed at [facility], [place] [address], [place], [place] [address]  Troponin I (High Sensitivity)     Status: Abnormal   Collection Time: 09/01/22   1:51 AM  Result Value Ref Range   Troponin I (High Sensitivity) 223 (HH) <18 ng/L    Comment: CRITICAL RESULT CALLED TO, READ BACK BY AND VERIFIED WITH Kelby Aline THOMPSON RN 09/01/22 0307 M KOROLESKI (NOTE) Elevated high sensitivity troponin I (hsTnI) values and significant  changes across serial measurements may suggest ACS but many other  chronic and acute conditions are known to elevate hsTnI results.  Refer to the "Links" section for chest pain algorithms and additional  guidance. Performed at [facility], [place] [address] [place], [place] [address]   Comprehensive metabolic panel     Status: Abnormal   Collection Time: 09/01/22  1:51 AM  Result Value Ref Range   Sodium 142 135 - 145 mmol/L   Potassium 4.8 3.5 - 5.1 mmol/L   Chloride 104 98 - 111 mmol/L   CO2 22 22 - 32 mmol/L   Glucose, Bld 110 (H) 70 - 99 mg/dL    Comment: Glucose reference range applies only to samples taken after fasting for at least 8 hours.   BUN 44 (H) 8 - 23 mg/dL   Creatinine, Ser 10.86 (H) 0.61 - 1.24 mg/dL   Calcium 7.5 (L) 8.9 - 10.3 mg/dL   Total Protein 6.6 6.5 - 8.1 g/dL   Albumin 2.6 (L) 3.5 - 5.0 g/dL   AST 17 15 - 41 U/L   ALT 12 0 - 44 U/L   Alkaline Phosphatase 89 38 - 126 U/L   Total Bilirubin 0.5 0.3 - 1.2 mg/dL   GFR, Estimated 5 (L) >60 mL/min    Comment: (NOTE) Calculated using the CKD-EPI Creatinine Equation (2021)    Anion gap 16 (H) 5 - 15    Comment: Performed at [facility], [place] [address] [place], [place] [address]   DG Chest Port 1 View  Result Date: 08/31/2022 CLINICAL DATA:  Shortness of breath. EXAM: PORTABLE CHEST 1 VIEW COMPARISON:  Radiograph 08/14/2019 FINDINGS: Right-sided dialysis catheter in place. Stable cardiomegaly. Vascular congestion with possible central perihilar edema. No large pleural effusion on this AP view. No confluent airspace disease. No pneumothorax. Skin fold projects over the left hemithorax. IMPRESSION: Cardiomegaly with vascular  congestion and possible central perihilar edema. Electronically Signed   By: Keith Rake M.D.   On: 08/31/2022 23:35    PMH:   Past Medical History:  Diagnosis Date   COVID-19 05/16/2020   Diabetes mellitus, type II, insulin dependent ([place])    ESRD on hemodialysis ([place]) 08/30/2021   GERD (gastroesophageal reflux disease)    Hematoma 03/19/2022   HIV (human immunodeficiency virus infection) ([place])  Hypertension    Meningoencephalitis    Neuropathy in diabetes ([place])    bilat feet   Recurrent falls    possible small SAH right frontal lobe 12/19/21 (Plavix held x 1 week); left transverse foramen C3 fracture 11/04/21   Ruptured lumbar disc    Seizure disorder ([place])    Stroke ([facility]) 2013   residual right sided weakness (arm>leg)    PSH:   Past Surgical History:  Procedure Laterality Date   AV FISTULA PLACEMENT Left 04/25/2020   Procedure: LEFT ARM BRACHIOCEPHALIC ARTERIOVENOUS (AV) FISTULA CREATION;  Surgeon: Rosetta Posner, MD;  Location: [facility];  Service: Vascular;  Laterality: Left;   AV FISTULA PLACEMENT Left 02/28/2022   Procedure: LEFT ARM INSERTION OF ARTERIOVENOUS (AV) GORE-TEX GRAFT ARM;  Surgeon: Marty Heck, MD;  Location: [facility];  Service: Vascular;  Laterality: Left;   IR FLUORO GUIDE CV LINE RIGHT  04/13/2020   IR FLUORO GUIDE CV LINE RIGHT  01/31/2022   IR REMOVAL TUN CV CATH W/O FL  01/14/2021   IR US GUIDE VASC ACCESS RIGHT  04/13/2020   IR US GUIDE VASC ACCESS RIGHT  01/31/2022   IR US GUIDE VASC ACCESS RIGHT  01/31/2022   KNEE ARTHROSCOPY Bilateral     Allergies:  Allergies  Allergen Reactions   Oxycodone Nausea And Vomiting    NOT DOCUMENTED ON MAR    Medications:   Prior to Admission medications   Medication Sig Start Date End Date Taking? Authorizing Provider  ACCU-CHEK AVIVA PLUS test strip USE 1 strip TO test blood sugar 2 TIMES DAILY TO 3 TIMES DAILY 06/10/19   [name], MD  acetaminophen (TYLENOL) 325 MG tablet Take 650 mg by mouth every 6  (six) hours as needed for moderate pain, fever or mild pain.    [provider]  b complex-vitamin c-folic acid (NEPHRO-VITE) 0.8 MG TABS tablet Take 1 tablet by mouth daily.    [provider]  blood glucose meter kit and supplies KIT Dispense based on patient and insurance preference. Check blood sugar 4 times a day 06/11/18   [name], MD  calcium acetate (PHOSLO) 667 MG capsule Take 2,001 mg by mouth 3 (three) times daily with meals.    [provider]  carvedilol (COREG) 12.5 MG tablet Take 12.5 mg by mouth 2 (two) times daily.    [provider]  cloNIDine (CATAPRES) 0.1 MG tablet Take 1 tablet (0.1 mg total) by mouth 3 (three) times daily. 02/21/21   Hosie Poisson, MD  clopidogrel (PLAVIX) 75 MG tablet Take 1 tablet (75 mg total) by mouth daily. 03/07/20   [name], MD  docusate sodium (COLACE) 100 MG capsule Take 100 mg by mouth daily.    [provider]  dolutegravir-lamiVUDine (DOVATO) 50-300 MG tablet Take 1 tablet by mouth daily. 03/19/22   Truman Hayward, MD  doxepin (SINEQUAN) 75 MG capsule Take 75 mg by mouth at bedtime.    [provider]  epoetin alfa-epbx (RETACRIT) 95188 UNIT/ML injection Inject 20,000 Units into the skin every Monday. Patient not taking: Reported on 08/13/2022    [provider]  Ergocalciferol 50 MCG (2000 UT) TABS Take 2,000 Units by mouth daily.    [provider]  hydrALAZINE (APRESOLINE) 10 MG tablet Take 10 mg by mouth 3 (three) times daily.    [provider]  hydrocortisone cream 1 % Apply 1 Application topically every 6 (six) hours as needed for itching. Apply  to affected areas on back arms    [provider]  insulin glargine-yfgn (SEMGLEE) 100 UNIT/ML Pen Inject 5 Units into the skin at bedtime.    [provider]  Insulin Pen Needle (SURE COMFORT PEN NEEDLES) 31G X 8 MM MISC Use as directed twice daily with Novolog flex pen 02/10/19    Koberlein, Junell C, MD  levETIRAcetam (KEPPRA) 500 MG tablet Take 500 mg by mouth 2 (two) times daily.    [provider]  Lidocaine 3 % CREA Apply 1 application topically See admin instructions. Apply 1 times a day on Tuesday, Thursday and Saturday prior to dialysis    [provider]  Misc. Devices (COMMODE 3-IN-1) MISC Use as directed 02/24/20   [name], MD  Misc. Devices (TRANSFER BENCH) MISC Use as directed 02/24/20   Koberlein, [name], MD  Nutritional Supplements (FEEDING SUPPLEMENT, NEPRO CARB STEADY,) LIQD Take 237 mLs by mouth 3 (three) times daily between meals.    [provider]  ondansetron (ZOFRAN) 4 MG tablet Take 4 mg by mouth every 8 (eight) hours as needed for vomiting or nausea. 12/09/21   [provider]  pantoprazole (PROTONIX) 40 MG tablet TAKE 1 TABLET BY MOUTH EVERY DAY Patient taking differently: Take 40 mg by mouth daily. 01/23/20   Koberlein, [name], MD  rosuvastatin (CRESTOR) 5 MG tablet Take 5 mg by mouth daily. 12/03/21   [provider]  sulfamethoxazole-trimethoprim (BACTRIM DS) 800-160 MG tablet Take 1 tablet by mouth 3 (three) times a week. Patient taking differently: Take 1 tablet by mouth every Monday, Wednesday, and Friday. Once daily on M-W F continuous 04/23/20   Little Ishikawa, MD  tamsulosin (FLOMAX) 0.4 MG CAPS capsule TAKE 1 CAPSULE BY MOUTH EVERY DAY Patient taking differently: Take 0.4 mg by mouth daily. 03/28/20   [name], MD  triamcinolone cream (KENALOG) 0.1 % Apply 1 Application topically in the morning and at bedtime. Apply to back and arms for rash twice a day 08/07/22   [provider]  [place] X 5/16" 1 ML MISC Use pen needle with insulin 2 times daily 03/31/19   [name], MD    Discontinued Meds:  There are no discontinued medications.  Social History:  reports that he has been smoking cigarettes. He has a 17.50 pack-year smoking  history. He has been exposed to tobacco smoke. He has never used smokeless tobacco. He reports that he does not currently use drugs after having used the following drugs: Marijuana. He reports that he does not drink alcohol.  Family History:   Family History  Problem Relation Age of Onset   Other Mother    Stroke Father    Hypertension Father    Epilepsy Father    Breast cancer Sister    Stroke Brother    Heart attack Brother    Diabetes Sister     Blood pressure (!) 153/93, pulse 82, temperature 98.8 F (37.1 C), temperature source Oral, resp. rate 19, SpO2 100 %. GEN On BiPAP, easily arousable HEENT EOMI PERRL NECK + JVD PULM bilateral crackles CV RRR ABD soft EXT  3+ LE edema NEURO Aao x 3, slurred speech and right hemiparesis (@ baseline) ACCESS: TDC and L AVG       Richard [name], Hunt Oris, MD 09/01/2022, 3:09 AM

## 2022-09-01 NOTE — Assessment & Plan Note (Signed)
Seizure precautions Continue keppra

## 2022-09-01 NOTE — Progress Notes (Signed)
Received patient in bed to unit.  Alert and oriented.  Informed consent signed and in chart.   Treatment initiated: 0824 Treatment completed: 1249  Patient tolerated well.  Transported back to the room  Alert, without acute distress.  Hand-off given to patient's nurse.   Access used: catheter Access issues: n/a  Total UF removed: 3500 Medication(s) given: n/a    09/01/22 1249  Vitals  Temp 98.1 F (36.7 C)  Temp Source Oral  BP (!) 142/118  Pulse Rate 82  Resp 20  Oxygen Therapy  SpO2 100 %  O2 Device Nasal Cannula  Patient Activity (if Appropriate) In bed  During Treatment Monitoring  Intra-Hemodialysis Comments Tx completed;Tolerated well  Post Treatment  Dialyzer Clearance Clear  Duration of HD Treatment -hour(s) 4 hour(s)  Liters Processed 71.6  Fluid Removed (mL) 3.5 mL  Tolerated HD Treatment Yes  Note  Observations pt alert  Hemodialysis Catheter Right Internal jugular Double lumen Permanent (Tunneled)  Placement Date/Time: 01/31/22 1543   Placed prior to admission: No  Time Out: Correct patient;Correct site;Correct procedure  Maximum sterile barrier precautions: Hand hygiene;Cap;Mask;Sterile gown;Sterile gloves;Large sterile sheet  Site Prep: Chlorh...  Site Condition No complications  Blue Lumen Status Heparin locked  Red Lumen Status Heparin locked  Dressing Status Antimicrobial disc in place;Clean, Dry, Intact  Drainage Description None  Post treatment catheter status Capped and [facility]

## 2022-09-01 NOTE — Procedures (Signed)
   I was present at this dialysis session, have reviewed the session itself and made  appropriate changes Kelly Splinter MD [facility] pager [phone]   09/01/2022, 3:31 PM

## 2022-09-01 NOTE — Assessment & Plan Note (Addendum)
Patient has been responding well to hemodialysis and ultrafiltration. He has not been compliant as outpatient with HD.   His volume has improved, patient will continue outpatient renal replacement therapy on Tuesday, Thursday and Saturday schedule.   Metabolic bone disease, continue with phoslo.  Anemia of chronic renal disease with iron deficiency, continue with EPO and iron supplementation.

## 2022-09-01 NOTE — ED Notes (Signed)
ED TO INPATIENT HANDOFF REPORT  ED Nurse Name and Phone #: Josh  S Name/Age/Gender Richard [name] 66 y.o. male Room/Bed: 022C/022C  Code Status   Code Status: Full Code  Home/SNF/Other [place] Patient oriented to: self, place, time, and situation Is this baseline? Yes   Triage Complete: Triage complete  Chief Complaint Acute respiratory failure with hypoxia ([place]) [J96.01]  Triage Note Pt bib [facility] from [facility] where roommate called reporting increased SOB. Hx CHF, rales bilaterally, SPO2 89% RA, placed on CPAP and switched to NRB due to equipment malfunction. BP 250/130, given 2 nitros, last BP 220/116. Roommate covid + 12/1. Dialysis T,Th, Sat, missed last Saturday session. Hx stroke w R side weakness+ slurred speech at baseline  HR 117 RR 24 SPO2 100 NRB   Allergies Allergies  Allergen Reactions   Oxycodone Nausea And Vomiting    NOT DOCUMENTED ON MAR    Level of Care/Admitting Diagnosis ED Disposition     ED Disposition  Admit   Condition  --   Comment  Hospital Area: [place] [[id number]]  Level of Care: Telemetry Medical [104]  May place patient in observation at [facility] or [place] if equivalent level of care is available:: No  Covid Evaluation: Asymptomatic - no recent exposure (last 10 days) testing not required  Diagnosis: Acute respiratory failure with hypoxia [facility]) [[id number]]  Admitting Physician: Orma Flaming [[id number]]  Attending Physician: Adora Fridge          B Medical/Surgery History Past Medical History:  Diagnosis Date   COVID-19 05/16/2020   Diabetes mellitus, type II, insulin dependent ([place])    ESRD on hemodialysis ([place]) 08/30/2021   GERD (gastroesophageal reflux disease)    Hematoma 03/19/2022   HIV (human immunodeficiency virus infection) ([place])    Hypertension    Meningoencephalitis    Neuropathy in diabetes ([place])    bilat feet   Recurrent falls     possible small SAH right frontal lobe 12/19/21 (Plavix held x 1 week); left transverse foramen C3 fracture 11/04/21   Ruptured lumbar disc    Seizure disorder ([place])    Stroke ([place]) 2013   residual right sided weakness (arm>leg)   Past Surgical History:  Procedure Laterality Date   AV FISTULA PLACEMENT Left 04/25/2020   Procedure: LEFT ARM BRACHIOCEPHALIC ARTERIOVENOUS (AV) FISTULA CREATION;  Surgeon: Rosetta Posner, MD;  Location: [facility] OR;  Service: Vascular;  Laterality: Left;   AV FISTULA PLACEMENT Left 02/28/2022   Procedure: LEFT ARM INSERTION OF ARTERIOVENOUS (AV) GORE-TEX GRAFT ARM;  Surgeon: Marty Heck, MD;  Location: [facility];  Service: Vascular;  Laterality: Left;   IR FLUORO GUIDE CV LINE RIGHT  04/13/2020   IR FLUORO GUIDE CV LINE RIGHT  01/31/2022   IR REMOVAL TUN CV CATH W/O FL  01/14/2021   IR US GUIDE VASC ACCESS RIGHT  04/13/2020   IR US GUIDE VASC ACCESS RIGHT  01/31/2022   IR US GUIDE VASC ACCESS RIGHT  01/31/2022   KNEE ARTHROSCOPY Bilateral      A IV Location/Drains/Wounds Patient Lines/Drains/Airways Status     Active Line/Drains/Airways     Name Placement date Placement time Site Days   Peripheral IV 08/31/22 22 G Anterior;Right Hand 08/31/22  --  Hand  1   Peripheral IV 09/01/22 20 G 1.88" Right;Upper Arm 09/01/22  0129  Arm  less than 1   Fistula / Graft Left Upper arm 01/10/21  2005  Upper arm  599   Fistula / Graft Left Upper arm Arteriovenous vein graft 02/28/22  0820  Upper arm  185   Hemodialysis Catheter Right Internal jugular Double lumen Permanent (Tunneled) 01/31/22  1543  Internal jugular  213   Incision (Closed) 02/28/22 Arm Left 02/28/22  0823  -- 185   Wound / Incision (Open or Dehisced) 01/31/22 Puncture Neck Right Venous access for HD catheter insertion 01/31/22  1556  Neck  213   Wound / Incision (Open or Dehisced) 01/31/22 Puncture Chest Right;Upper Tunneled HD insertion site 01/31/22  1601  Chest  213            Intake/Output Last 24  hours  Intake/Output Summary (Last 24 hours) at 09/01/2022 1506 Last data filed at 09/01/2022 1249 Gross per 24 hour  Intake 95.16 ml  Output 3.5 ml  Net 91.66 ml    Labs/Imaging Results for orders placed or performed during the hospital encounter of 08/31/22 (from the past 48 hour(s))  Resp panel by RT-PCR (RSV, Flu A&B, Covid) Anterior Nasal Swab     Status: None   Collection Time: 08/31/22 11:13 PM   Specimen: Anterior Nasal Swab  Result Value Ref Range   SARS Coronavirus 2 by RT PCR NEGATIVE NEGATIVE    Comment: (NOTE) SARS-CoV-2 target nucleic acids are NOT DETECTED.  The SARS-CoV-2 RNA is generally detectable in upper respiratory specimens during the acute phase of infection. The lowest concentration of SARS-CoV-2 viral copies this assay can detect is 138 copies/mL. A negative result does not preclude SARS-Cov-2 infection and should not be used as the sole basis for treatment or other patient management decisions. A negative result may occur with  improper specimen collection/handling, submission of specimen other than nasopharyngeal swab, presence of viral mutation(s) within the areas targeted by this assay, and inadequate number of viral copies(<138 copies/mL). A negative result must be combined with clinical observations, patient history, and epidemiological information. The expected result is Negative.  Fact Sheet for Patients:  EntrepreneurPulse.com.au  Fact Sheet for Healthcare Providers:  IncredibleEmployment.be  This test is no t yet approved or cleared by the [place] FDA and  has been authorized for detection and/or diagnosis of SARS-CoV-2 by FDA under an Emergency Use Authorization (EUA). This EUA will remain  in effect (meaning this test can be used) for the duration of the COVID-19 declaration under Section 564(b)(1) of the Act, 21 U.S.C.section 360bbb-3(b)(1), unless the authorization is terminated  or revoked  sooner.       Influenza A by PCR NEGATIVE NEGATIVE   Influenza B by PCR NEGATIVE NEGATIVE    Comment: (NOTE) The Xpert Xpress SARS-CoV-2/FLU/RSV plus assay is intended as an aid in the diagnosis of influenza from Nasopharyngeal swab specimens and should not be used as a sole basis for treatment. Nasal washings and aspirates are unacceptable for Xpert Xpress SARS-CoV-2/FLU/RSV testing.  Fact Sheet for Patients: EntrepreneurPulse.com.au  Fact Sheet for Healthcare Providers: IncredibleEmployment.be  This test is not yet approved or cleared by the [place] FDA and has been authorized for detection and/or diagnosis of SARS-CoV-2 by FDA under an Emergency Use Authorization (EUA). This EUA will remain in effect (meaning this test can be used) for the duration of the COVID-19 declaration under Section 564(b)(1) of the Act, 21 U.S.C. section 360bbb-3(b)(1), unless the authorization is terminated or revoked.     Resp Syncytial Virus by PCR NEGATIVE NEGATIVE    Comment: (NOTE) Fact Sheet for Patients: EntrepreneurPulse.com.au  Fact Sheet for Healthcare Providers: IncredibleEmployment.be  This test is not yet approved or cleared by the [place] and has been authorized for detection and/or diagnosis of SARS-CoV-2 by FDA under an Emergency Use Authorization (EUA). This EUA will remain in effect (meaning this test can be used) for the duration of the COVID-19 declaration under Section 564(b)(1) of the Act, 21 U.S.C. section 360bbb-3(b)(1), unless the authorization is terminated or revoked.  Performed at [facility], [place] [address], [place], [place] [address]   CBC with Differential     Status: Abnormal   Collection Time: 08/31/22 11:42 PM  Result Value Ref Range   WBC 15.2 (H) 4.0 - 10.5 K/uL   RBC 3.30 (L) 4.22 - 5.81 MIL/uL   Hemoglobin 10.9 (L) 13.0 - 17.0 g/dL   HCT 33.3 (L) 39.0 -  52.0 %   MCV 100.9 (H) 80.0 - 100.0 fL   MCH 33.0 26.0 - 34.0 pg   MCHC 32.7 30.0 - 36.0 g/dL   RDW 16.6 (H) 11.5 - 15.5 %   Platelets  150 - 400 K/uL    PLATELET CLUMPS NOTED ON SMEAR, COUNT APPEARS ADEQUATE   nRBC 0.3 (H) 0.0 - 0.2 %   Neutrophils Relative % 76 %   Neutro Abs 11.8 (H) 1.7 - 7.7 K/uL   Lymphocytes Relative 10 %   Lymphs Abs 1.6 0.7 - 4.0 K/uL   Monocytes Relative 8 %   Monocytes Absolute 1.2 (H) 0.1 - 1.0 K/uL   Eosinophils Relative 4 %   Eosinophils Absolute 0.5 0.0 - 0.5 K/uL   Basophils Relative 1 %   Basophils Absolute 0.1 0.0 - 0.1 K/uL   Immature Granulocytes 1 %   Abs Immature Granulocytes 0.08 (H) 0.00 - 0.07 K/uL   Polychromasia PRESENT     Comment: Performed at [facility], [place] [address] [place], [place] [address]  Troponin I (High Sensitivity)     Status: Abnormal   Collection Time: 08/31/22 11:42 PM  Result Value Ref Range   Troponin I (High Sensitivity) 88 (H) <18 ng/L    Comment: (NOTE) Elevated high sensitivity troponin I (hsTnI) values and significant  changes across serial measurements may suggest ACS but many other  chronic and acute conditions are known to elevate hsTnI results.  Refer to the "Links" section for chest pain algorithms and additional  guidance. Performed at [facility], [place] [address] [place], [place] [address]   Brain natriuretic peptide     Status: Abnormal   Collection Time: 08/31/22 11:42 PM  Result Value Ref Range   B Natriuretic Peptide 2,046.1 (H) 0.0 - 100.0 pg/mL    Comment: Performed at [facility] [name], [place], [place] [address]  I-stat chem 8, ED     Status: Abnormal   Collection Time: 08/31/22 11:52 PM  Result Value Ref Range   Sodium 138 135 - 145 mmol/L   Potassium 5.8 (H) 3.5 - 5.1 mmol/L   Chloride 110 98 - 111 mmol/L   BUN 64 (H) 8 - 23 mg/dL   Creatinine, Ser 11.40 (H) 0.61 - 1.24 mg/dL   Glucose, Bld 133 (H) 70 - 99 mg/dL    Comment: Glucose reference range  applies only to samples taken after fasting for at least 8 hours.   Calcium, Ion 0.79 (LL) 1.15 - 1.40 mmol/L   TCO2 19 (L) 22 - 32 mmol/L   Hemoglobin 11.6 (L) 13.0 - 17.0 g/dL   HCT 34.0 (L) 39.0 - 52.0 %  Comment NOTIFIED PHYSICIAN   I-Stat venous blood gas, ED     Status: Abnormal   Collection Time: 08/31/22 11:53 PM  Result Value Ref Range   pH, Ven 7.404 7.25 - 7.43   pCO2, Ven 28.3 (L) 44 - 60 mmHg   pO2, Ven 137 (H) 32 - 45 mmHg   Bicarbonate 17.7 (L) 20.0 - 28.0 mmol/L   TCO2 19 (L) 22 - 32 mmol/L   O2 Saturation 99 %   Acid-base deficit 6.0 (H) 0.0 - 2.0 mmol/L   Sodium 139 135 - 145 mmol/L   Potassium 5.9 (H) 3.5 - 5.1 mmol/L   Calcium, Ion 0.78 (LL) 1.15 - 1.40 mmol/L   HCT 34.0 (L) 39.0 - 52.0 %   Hemoglobin 11.6 (L) 13.0 - 17.0 g/dL   Sample type VENOUS    Comment NOTIFIED PHYSICIAN   Lactic acid, plasma     Status: None   Collection Time: 09/01/22  1:22 AM  Result Value Ref Range   Lactic Acid, Venous 1.1 0.5 - 1.9 mmol/L    Comment: Performed at [facility], [place] [address], [place], [place] [address]  Culture, blood (routine x 2)     Status: None (Preliminary result)   Collection Time: 09/01/22  1:22 AM   Specimen: BLOOD  Result Value Ref Range   Specimen Description BLOOD RIGHT ANTECUBITAL    Special Requests      BOTTLES DRAWN AEROBIC AND ANAEROBIC Blood Culture adequate volume   Culture      NO GROWTH < 12 HOURS Performed at [facility], [place] [address], [place], [place] [address]    Report Status PENDING   Troponin I (High Sensitivity)     Status: Abnormal   Collection Time: 09/01/22  1:51 AM  Result Value Ref Range   Troponin I (High Sensitivity) 223 (HH) <18 ng/L    Comment: CRITICAL RESULT CALLED TO, READ BACK BY AND VERIFIED WITH Kelby Aline THOMPSON RN 09/01/22 0307 M KOROLESKI (NOTE) Elevated high sensitivity troponin I (hsTnI) values and significant  changes across serial measurements may suggest ACS but many other  chronic and acute  conditions are known to elevate hsTnI results.  Refer to the "Links" section for chest pain algorithms and additional  guidance. Performed at [facility], [place] [address] [place], [place] [address]   Comprehensive metabolic panel     Status: Abnormal   Collection Time: 09/01/22  1:51 AM  Result Value Ref Range   Sodium 142 135 - 145 mmol/L   Potassium 4.8 3.5 - 5.1 mmol/L   Chloride 104 98 - 111 mmol/L   CO2 22 22 - 32 mmol/L   Glucose, Bld 110 (H) 70 - 99 mg/dL    Comment: Glucose reference range applies only to samples taken after fasting for at least 8 hours.   BUN 44 (H) 8 - 23 mg/dL   Creatinine, Ser 10.86 (H) 0.61 - 1.24 mg/dL   Calcium 7.5 (L) 8.9 - 10.3 mg/dL   Total Protein 6.6 6.5 - 8.1 g/dL   Albumin 2.6 (L) 3.5 - 5.0 g/dL   AST 17 15 - 41 U/L   ALT 12 0 - 44 U/L   Alkaline Phosphatase 89 38 - 126 U/L   Total Bilirubin 0.5 0.3 - 1.2 mg/dL   GFR, Estimated 5 (L) >60 mL/min    Comment: (NOTE) Calculated using the CKD-EPI Creatinine Equation (2021)    Anion gap 16 (H) 5 - 15  Comment: Performed at [facility], [place] [address] [place], [place] [address]  Lactic acid, plasma     Status: None   Collection Time: 09/01/22  4:15 AM  Result Value Ref Range   Lactic Acid, Venous 1.1 0.5 - 1.9 mmol/L    Comment: Performed at [facility] [address], [place], [place] [address]  Hepatitis B surface antigen     Status: None   Collection Time: 09/01/22  4:15 AM  Result Value Ref Range   Hepatitis B Surface Ag NON REACTIVE NON REACTIVE    Comment: Performed at [facility] [address] [place], [place] [address]  Troponin I (High Sensitivity)     Status: Abnormal   Collection Time: 09/01/22  4:15 AM  Result Value Ref Range   Troponin I (High Sensitivity) 544 (HH) <18 ng/L    Comment: CRITICAL VALUE NOTED. VALUE IS CONSISTENT WITH PREVIOUSLY REPORTED/CALLED VALUE (NOTE) Elevated high sensitivity troponin I (hsTnI) values and significant   changes across serial measurements may suggest ACS but many other  chronic and acute conditions are known to elevate hsTnI results.  Refer to the "Links" section for chest pain algorithms and additional  guidance. Performed at [facility], [place] [address] [place], [place] [address]   CBG monitoring, ED     Status: None   Collection Time: 09/01/22  6:34 AM  Result Value Ref Range   Glucose-Capillary 78 70 - 99 mg/dL    Comment: Glucose reference range applies only to samples taken after fasting for at least 8 hours.   Comment 1 Notify RN   CBG monitoring, ED     Status: None   Collection Time: 09/01/22  7:54 AM  Result Value Ref Range   Glucose-Capillary 74 70 - 99 mg/dL    Comment: Glucose reference range applies only to samples taken after fasting for at least 8 hours.  CBC     Status: Abnormal   Collection Time: 09/01/22  8:34 AM  Result Value Ref Range   WBC 7.8 4.0 - 10.5 K/uL   RBC 2.53 (L) 4.22 - 5.81 MIL/uL   Hemoglobin 8.4 (L) 13.0 - 17.0 g/dL   HCT 25.2 (L) 39.0 - 52.0 %   MCV 99.6 80.0 - 100.0 fL   MCH 33.2 26.0 - 34.0 pg   MCHC 33.3 30.0 - 36.0 g/dL   RDW 16.0 (H) 11.5 - 15.5 %   Platelets 189 150 - 400 K/uL   nRBC 0.3 (H) 0.0 - 0.2 %    Comment: Performed at [facility] [address], [place], [place] [address]   DG Chest Port 1 View  Result Date: 08/31/2022 CLINICAL DATA:  Shortness of breath. EXAM: PORTABLE CHEST 1 VIEW COMPARISON:  Radiograph 08/14/2019 FINDINGS: Right-sided dialysis catheter in place. Stable cardiomegaly. Vascular congestion with possible central perihilar edema. No large pleural effusion on this AP view. No confluent airspace disease. No pneumothorax. Skin fold projects over the left hemithorax. IMPRESSION: Cardiomegaly with vascular congestion and possible central perihilar edema. Electronically Signed   By: Keith Rake M.D.   On: 08/31/2022 23:35    Pending Labs Unresulted Labs (From admission, onward)     Start      Ordered   09/02/22 0254  Basic metabolic panel  Tomorrow morning,   R        09/01/22 1434   09/02/22 0500  CBC  Tomorrow morning,   R  09/01/22 1434   09/01/22 1433  RNA, PCR (Graph) rfx/Geno EDI  Once,   R        09/01/22 1432   09/01/22 1425  T-helper cells (CD4) count (not at [facility])  Once,   R        09/01/22 1432   09/01/22 1414  Rapid urine drug screen (hospital performed)  ONCE - STAT,   STAT        09/01/22 1414   09/01/22 0332  Hepatitis B surface antibody,quantitative  (New Admission [facility] (Hepatitis B))  Once,   URGENT        09/01/22 0332   08/31/22 2315  Culture, blood (routine x 2)  BLOOD CULTURE X 2,   R (with STAT occurrences)      08/31/22 2314            Vitals/Pain Today's Vitals   09/01/22 1144 09/01/22 1200 09/01/22 1241 09/01/22 1249  BP: (!) 150/88 (!) 174/68 (!) 177/67 (!) 142/118  Pulse: 85 90  82  Resp: 16 18 19 20   Temp:   98.2 F (36.8 C) 98.1 F (36.7 C)  TempSrc:   Oral Oral  SpO2: 100% 100% 100% 100%  Weight:      Height:      PainSc:        Isolation Precautions No active isolations  Medications Medications  Chlorhexidine Gluconate Cloth 2 % PADS 6 each (6 each Topical Not Given 09/01/22 0631)  pentafluoroprop-tetrafluoroeth (GEBAUERS) aerosol 1 Application (has no administration in time range)  lidocaine (PF) (XYLOCAINE) 1 % injection 5 mL (has no administration in time range)  lidocaine-prilocaine (EMLA) cream 1 Application (has no administration in time range)  heparin injection 1,000 Units (has no administration in time range)  anticoagulant sodium citrate solution 5 mL (has no administration in time range)  alteplase (CATHFLO ACTIVASE) injection 2 mg (has no administration in time range)  pantoprazole (PROTONIX) EC tablet 40 mg (has no administration in time range)  hydrALAZINE (APRESOLINE) tablet 10 mg (10 mg Oral Given 09/01/22 0643)  carvedilol (COREG) tablet 12.5 mg (has no administration in time range)  cloNIDine  (CATAPRES) tablet 0.1 mg (has no administration in time range)  levETIRAcetam (KEPPRA) tablet 500 mg (has no administration in time range)  rosuvastatin (CRESTOR) tablet 5 mg (has no administration in time range)  clopidogrel (PLAVIX) tablet 75 mg (has no administration in time range)  insulin aspart (novoLOG) injection 0-6 Units ( Subcutaneous Not Given 09/01/22 0635)  dolutegravir-lamiVUDine (DOVATO) 50-300 MG per tablet 1 tablet (has no administration in time range)  heparin sodium (porcine) 1000 UNIT/ML injection (has no administration in time range)  sulfamethoxazole-trimethoprim (BACTRIM DS) 800-160 MG per tablet 1 tablet (has no administration in time range)  doxepin (SINEQUAN) capsule 75 mg (has no administration in time range)  insulin glargine-yfgn (SEMGLEE) Pen 5 Units (has no administration in time range)  calcium acetate (PHOSLO) capsule 2,001 mg (has no administration in time range)  docusate sodium (COLACE) capsule 100 mg (has no administration in time range)  tamsulosin (FLOMAX) capsule 0.4 mg (has no administration in time range)  multivitamin (RENA-VIT) tablet 1 tablet (has no administration in time range)  feeding supplement (NEPRO CARB STEADY) liquid 237 mL (has no administration in time range)  Darbepoetin Alfa (ARANESP) injection 100 mcg (has no administration in time range)  heparin injection 5,000 Units (has no administration in time range)  sodium chloride flush (NS) 0.9 % injection 3 mL (has no administration in time  range)  sodium chloride flush (NS) 0.9 % injection 3 mL (has no administration in time range)  0.9 %  sodium chloride infusion (has no administration in time range)  acetaminophen (TYLENOL) tablet 650 mg (has no administration in time range)    Or  acetaminophen (TYLENOL) suppository 650 mg (has no administration in time range)  hydrALAZINE (APRESOLINE) injection 10 mg (has no administration in time range)  carvedilol (COREG) tablet 12.5 mg (12.5 mg Oral  Given 09/01/22 0407)  cloNIDine (CATAPRES) tablet 0.1 mg (0.1 mg Oral Given 09/01/22 0407)  heparin injection 2,000 Units (2,000 Units Dialysis Given 09/01/22 1109)    Mobility walks with device Low fall risk   Focused Assessments Pulmonary Assessment Handoff:  Lung sounds: Bilateral Breath Sounds: Clear, Diminished L Breath Sounds: Rales, Expiratory wheezes R Breath Sounds: Rales, Expiratory wheezes O2 Device: Nasal Cannula      R Recommendations: See Admitting Provider Note  Report given to:   Additional Notes:

## 2022-09-01 NOTE — H&P (Signed)
History and Physical    Patient: Richard [name] DOB: 04-17-1956 DOA: 08/31/2022 DOS: the patient was seen and examined on 09/01/2022 PCP: Jodi Marble, MD  Patient coming from: SNF - [place]. Uses WC    Chief Complaint: shortness of breath/respiratory distress   HPI: Richard [name] is a 66 y.o. male with medical history significant of AIDS, ESRD on dialysis TTS, HTN, T2DM, HLD,  hx of CVA, GERD, seizure disorder who presented to ED with complaints of shortness of breath/respiratory distress after missing dialysis on 08/30/22.  He states he had sudden onset of shortness of breath and couldn't breath so his roommate called EMS.  EMS was called by his roommate and his oxygen was 89% on [place] and he was placed on CPAP and treated with 2 SL NG for HTN and bp of 250/130. He states  he has one episode of vomiting last night on his way over to the hospital, but none since that time and is not nauseated. He also states he has noticed swelling in his legs.   He last full session of dialysis was last Thursday. He missed his Saturday session due to his birthday.    Denies any fever/chills, vision changes/headaches, chest pain or palpitations, shortness of breath or cough, abdominal pain, N/V/D, dysuria.    He smokes 1/2 PPD and does not drink alcohol. No drugs.   ER Course:  vitals: afebrile, bp: 241/117, HR: 115, RR: 25, oxygen 100%NRB Pertinent labs: covid/flu negative, wbc: 15.2, hgb: 10.9, troponin 88>223> 544, bnp: 2046, istat vbg: pH: 7.404, pc02 28.3, BUN: 64, creatinine: 11.40,  CXR: cardiomegaly with vascular congestion and possible central perilhilar edema.  In ED: ICU orginially called due to HTN emergency and needed nitro drip; however, he was weaned off this and started on oral meds and taken to dialysis. He was still requiring oxygen after dialysis so [facility] asked to admit.    Review of Systems: As mentioned in the history of present illness. All other  systems reviewed and are negative. Past Medical History:  Diagnosis Date   COVID-19 05/16/2020   Diabetes mellitus, type II, insulin dependent ([place])    ESRD on hemodialysis ([place]) 08/30/2021   GERD (gastroesophageal reflux disease)    Hematoma 03/19/2022   HIV (human immunodeficiency virus infection) ([place])    Hypertension    Meningoencephalitis    Neuropathy in diabetes ([place])    bilat feet   Recurrent falls    possible small SAH right frontal lobe 12/19/21 (Plavix held x 1 week); left transverse foramen C3 fracture 11/04/21   Ruptured lumbar disc    Seizure disorder ([place])    Stroke ([place] 2013   residual right sided weakness (arm>leg)   Past Surgical History:  Procedure Laterality Date   AV FISTULA PLACEMENT Left 04/25/2020   Procedure: LEFT ARM BRACHIOCEPHALIC ARTERIOVENOUS (AV) FISTULA CREATION;  Surgeon: Rosetta Posner, MD;  Location: [facility] OR;  Service: Vascular;  Laterality: Left;   AV FISTULA PLACEMENT Left 02/28/2022   Procedure: LEFT ARM INSERTION OF ARTERIOVENOUS (AV) GORE-TEX GRAFT ARM;  Surgeon: Marty Heck, MD;  Location: [facility] OR;  Service: Vascular;  Laterality: Left;   IR FLUORO GUIDE CV LINE RIGHT  04/13/2020   IR FLUORO GUIDE CV LINE RIGHT  01/31/2022   IR REMOVAL TUN CV CATH W/O FL  01/14/2021   IR US GUIDE VASC ACCESS RIGHT  04/13/2020   IR US GUIDE VASC ACCESS RIGHT  01/31/2022   IR US GUIDE VASC ACCESS RIGHT  01/31/2022   KNEE ARTHROSCOPY Bilateral    Social History:  reports that he has been smoking cigarettes. He has a 17.50 pack-year smoking history. He has been exposed to tobacco smoke. He has never used smokeless tobacco. He reports that he does not currently use drugs after having used the following drugs: Marijuana. He reports that he does not drink alcohol.  Allergies  Allergen Reactions   Oxycodone Nausea And Vomiting    NOT DOCUMENTED ON MAR    Family History  Problem Relation Age of Onset   Other Mother    Stroke Father    Hypertension Father     Epilepsy Father    Breast cancer Sister    Stroke Brother    Heart attack Brother    Diabetes Sister     Prior to Admission medications   Medication Sig Start Date End Date Taking? Authorizing Provider  acetaminophen (TYLENOL) 325 MG tablet Take 650 mg by mouth every 6 (six) hours as needed for moderate pain, fever or mild pain.   Yes [provider]  b complex-vitamin c-folic acid (NEPHRO-VITE) 0.8 MG TABS tablet Take 1 tablet by mouth daily.   Yes [provider]  calcium acetate (PHOSLO) 667 MG capsule Take 2,001 mg by mouth 3 (three) times daily with meals.   Yes [provider]  carvedilol (COREG) 12.5 MG tablet Take 12.5 mg by mouth 2 (two) times daily.   Yes [provider]  cloNIDine (CATAPRES) 0.1 MG tablet Take 1 tablet (0.1 mg total) by mouth 3 (three) times daily. 02/21/21  Yes Hosie Poisson, MD  clopidogrel (PLAVIX) 75 MG tablet Take 1 tablet (75 mg total) by mouth daily. 03/07/20  Yes Koberlein, Junell C, MD  docusate sodium (COLACE) 100 MG capsule Take 100 mg by mouth daily.   Yes [provider]  dolutegravir-lamiVUDine (DOVATO) 50-300 MG tablet Take 1 tablet by mouth daily. 03/19/22  Yes Tommy Medal, Lavell Islam, MD  doxepin (SINEQUAN) 75 MG capsule Take 75 mg by mouth at bedtime.   Yes [provider]  Ergocalciferol 50 MCG (2000 UT) TABS Take 2,000 Units by mouth daily.   Yes [provider]  hydrALAZINE (APRESOLINE) 10 MG tablet Take 10 mg by mouth 3 (three) times daily.   Yes [provider]  hydrocortisone cream 1 % Apply 1 Application topically every 6 (six) hours as needed for itching. Apply to affected areas on back arms   Yes [provider]  insulin glargine-yfgn (SEMGLEE) 100 UNIT/ML Pen Inject 5 Units into the skin at bedtime.   Yes [provider]  levETIRAcetam (KEPPRA) 500 MG tablet Take 500 mg by mouth 2 (two) times daily.   Yes [provider]  Lidocaine 3 % CREA Apply 1  application topically See admin instructions. Apply 1 times a day on Tuesday, Thursday and Saturday prior to dialysis   Yes [provider]  Nutritional Supplements (FEEDING SUPPLEMENT, NEPRO CARB STEADY,) LIQD Take 237 mLs by mouth 3 (three) times daily between meals.   Yes [provider]  ondansetron (ZOFRAN) 4 MG tablet Take 4 mg by mouth every 8 (eight) hours as needed for vomiting or nausea. 12/09/21  Yes [provider]  pantoprazole (PROTONIX) 40 MG tablet TAKE 1 TABLET BY MOUTH EVERY DAY Patient taking differently: Take 40 mg by mouth daily. 01/23/20  Yes Koberlein, Junell C, MD  rosuvastatin (CRESTOR) 5 MG tablet Take 5 mg by mouth daily. 12/03/21  Yes [provider]  sulfamethoxazole-trimethoprim (  BACTRIM DS) 800-160 MG tablet Take 1 tablet by mouth 3 (three) times a week. Patient taking differently: Take 1 tablet by mouth every Monday, Wednesday, and Friday. Once daily on M-W F continuous 04/23/20  Yes Little Ishikawa, MD  tamsulosin (FLOMAX) 0.4 MG CAPS capsule TAKE 1 CAPSULE BY MOUTH EVERY DAY Patient taking differently: Take 0.4 mg by mouth daily. 03/28/20  Yes Koberlein, [name], MD  traMADol (ULTRAM) 50 MG tablet Take 50 mg by mouth every 8 (eight) hours as needed (for pain in right hip area).   Yes [provider]  triamcinolone cream (KENALOG) 0.1 % Apply 1 Application topically in the morning and at bedtime. Apply to back and arms for rash twice a day 08/07/22  Yes [provider]  [place] test strip USE 1 strip TO test blood sugar 2 TIMES DAILY TO 3 TIMES DAILY 06/10/19   [name], MD  blood glucose meter kit and supplies KIT Dispense based on patient and insurance preference. Check blood sugar 4 times a day 06/11/18   [name], MD  epoetin alfa-epbx (RETACRIT) 42683 UNIT/ML injection Inject 20,000 Units into the skin every Monday.    [provider]  Insulin Pen Needle (SURE COMFORT PEN  NEEDLES) 31G X 8 MM MISC Use as directed twice daily with Novolog flex pen 02/10/19   [name], MD  Misc. Devices (COMMODE 3-IN-1) MISC Use as directed 02/24/20   [name], MD  Misc. Devices (TRANSFER BENCH) MISC Use as directed 02/24/20   [name], MD  TRUEPLUS INSULIN SYRINGE 31G X 5/16" 1 ML MISC Use pen needle with insulin 2 times daily 03/31/19   [name], MD    Physical Exam: Vitals:   09/01/22 1249 09/01/22 1600 09/01/22 1613 09/01/22 1651  BP: (!) 142/118 (!) 141/67  (!) 191/67  Pulse: 82 88  92  Resp: _0 Temp: 98.1 F (36.7 C)  98.2 F (36.8 C) 98.7 F (37.1 C)  TempSrc: Oral  Oral Oral  SpO2: 100% 100%  100%  Weight:      Height:       General:  Appears calm and comfortable and is in NAD Eyes:  PERRL, EOMI, normal lids, iris ENT:  grossly normal hearing, lips & tongue, mmm; poor dentition, missing teeth  Neck:  no LAD, masses or thyromegaly; no carotid bruits Cardiovascular:  RRR, no m/r/g. No LE edema.  Respiratory:   CTA bilaterally with no wheezes/rales/rhonchi.  Normal respiratory effort. Abdomen:  soft, NT, ND, NABS Back:   normal alignment, no CVAT Skin:  no rash or induration seen on limited exam Musculoskeletal:  grossly normal tone LUE/LLE. RUE contracted and hypertonic, RLE 1/5 at baseline. no bony abnormality Lower extremity:  No LE edema.  Limited foot exam with no ulcerations.  2+ distal pulses. Psychiatric:  grossly normal mood and affect, speech slurred at baseline and appropriate, AOx3 Neurologic:  CN 2-12 grossly intact. At baseline from previous stroke.   Radiological Exams on Admission: Independently reviewed - see discussion in A/P where applicable  ECHOCARDIOGRAM COMPLETE  Result Date: 09/01/2022    ECHOCARDIOGRAM REPORT   Patient Name:   Richard [name] Date of Exam: 09/01/2022 Medical Rec #:  [id number]             Height:       72.0 in Accession #:    [id number]  Weight:       169.5  lb Date of Birth:  04-26-1956             BSA:          1.986 m Patient Age:    57 years              BP:           142/118 mmHg Patient Gender: M                     HR:           96 bpm. Exam Location:  Inpatient Procedure: 2D Echo, Cardiac Doppler and Color Doppler Indications:    Elevated Troponin  History:        Patient has prior history of Echocardiogram examinations, most                 recent 10/20/2021. Stroke; Risk Factors:Hypertension, Diabetes                 and GERD.  Sonographer:    Bernadene Person RDCS Referring Phys: [id number] [place]  1. Left ventricular ejection fraction, by estimation, is 45 to 50%. The left ventricle has mildly decreased function. The left ventricle demonstrates regional wall motion abnormalities (see scoring diagram/findings for description). There is moderate left ventricular hypertrophy. Left ventricular diastolic parameters are consistent with Grade I diastolic dysfunction (impaired relaxation). Elevated left ventricular end-diastolic pressure. There is hypokinesis of the left ventricular, entire inferior wall.  2. Right ventricular systolic function is normal. The right ventricular size is normal. Tricuspid regurgitation signal is inadequate for assessing PA pressure.  3. Left atrial size was mild to moderately dilated.  4. The mitral valve is normal in structure. No evidence of mitral valve regurgitation. No evidence of mitral stenosis.  5. The aortic valve is tricuspid. There is mild calcification of the aortic valve. Aortic valve regurgitation is not visualized. Aortic valve sclerosis is present, with no evidence of aortic valve stenosis.  6. The inferior vena cava is normal in size with greater than 50% respiratory variability, suggesting right atrial pressure of 3 mmHg. Comparison(s): No significant change from prior study. FINDINGS  Left Ventricle: Left ventricular ejection fraction, by estimation, is 45 to 50%. The left ventricle has mildly decreased  function. The left ventricle demonstrates regional wall motion abnormalities. The left ventricular internal cavity size was normal in size. There is moderate left ventricular hypertrophy. Left ventricular diastolic parameters are consistent with Grade I diastolic dysfunction (impaired relaxation). Elevated left ventricular end-diastolic pressure. Right Ventricle: The right ventricular size is normal. No increase in right ventricular wall thickness. Right ventricular systolic function is normal. Tricuspid regurgitation signal is inadequate for assessing PA pressure. Left Atrium: Left atrial size was mild to moderately dilated. Right Atrium: Right atrial size was normal in size. Pericardium: There is no evidence of pericardial effusion. Mitral Valve: The mitral valve is normal in structure. No evidence of mitral valve regurgitation. No evidence of mitral valve stenosis. Tricuspid Valve: The tricuspid valve is normal in structure. Tricuspid valve regurgitation is not demonstrated. No evidence of tricuspid stenosis. Aortic Valve: The aortic valve is tricuspid. There is mild calcification of the aortic valve. Aortic valve regurgitation is not visualized. Aortic valve sclerosis is present, with no evidence of aortic valve stenosis. Pulmonic Valve: The pulmonic valve was not well visualized. Pulmonic valve regurgitation is not visualized. No evidence of pulmonic stenosis. Aorta: The aortic root, ascending aorta  and aortic arch are all structurally normal, with no evidence of dilitation or obstruction. Venous: The inferior vena cava is normal in size with greater than 50% respiratory variability, suggesting right atrial pressure of 3 mmHg. IAS/Shunts: The atrial septum is grossly normal.  LEFT VENTRICLE PLAX 2D LVIDd:         4.90 cm      Diastology LVIDs:         3.70 cm      LV e' medial:    3.00 cm/s LV PW:         1.92 cm      LV E/e' medial:  24.2 LV IVS:        1.83 cm      LV e' lateral:   3.43 cm/s LVOT diam:      2.20 cm      LV E/e' lateral: 21.2 LV SV:         69 LV SV Index:   35 LVOT Area:     3.80 cm  LV Volumes (MOD) LV vol d, MOD A2C: 121.0 ml LV vol d, MOD A4C: 160.0 ml LV vol s, MOD A2C: 63.6 ml LV vol s, MOD A4C: 94.3 ml LV SV MOD A2C:     57.4 ml LV SV MOD A4C:     160.0 ml LV SV MOD BP:      62.4 ml RIGHT VENTRICLE RV S prime:     13.90 cm/s TAPSE (M-mode): 2.5 cm LEFT ATRIUM             Index        RIGHT ATRIUM           Index LA diam:        4.30 cm 2.17 cm/m   RA Area:     15.30 cm LA Vol (A2C):   75.3 ml 37.91 ml/m  RA Volume:   34.90 ml  17.57 ml/m LA Vol (A4C):   56.2 ml 28.30 ml/m LA Biplane Vol: 66.8 ml 33.63 ml/m  AORTIC VALVE LVOT Vmax:   110.00 cm/s LVOT Vmean:  70.200 cm/s LVOT VTI:    0.182 m  AORTA Ao Root diam: 3.40 cm Ao Asc diam:  3.30 cm MITRAL VALVE MV Area (PHT): 6.17 cm     SHUNTS MV Decel Time: 123 msec     Systemic VTI:  0.18 m MV E velocity: 72.70 cm/s   Systemic Diam: 2.20 cm MV A velocity: 137.00 cm/s MV E/A ratio:  0.53 Buford Dresser MD Electronically signed by Buford Dresser MD Signature Date/Time: 09/01/2022/5:09:38 PM    Final    DG Chest Port 1 View  Result Date: 08/31/2022 CLINICAL DATA:  Shortness of breath. EXAM: PORTABLE CHEST 1 VIEW COMPARISON:  Radiograph 08/14/2019 FINDINGS: Right-sided dialysis catheter in place. Stable cardiomegaly. Vascular congestion with possible central perihilar edema. No large pleural effusion on this AP view. No confluent airspace disease. No pneumothorax. Skin fold projects over the left hemithorax. IMPRESSION: Cardiomegaly with vascular congestion and possible central perihilar edema. Electronically Signed   By: Keith Rake M.D.   On: 08/31/2022 23:35    EKG: Independently reviewed.  Sinus tachycardia with rate 116; nonspecific ST changes with no evidence of acute ischemia   Labs on Admission: I have personally reviewed the available labs and imaging studies at the time of the admission.  Pertinent labs:    covid/flu negative,  wbc: 15.2,  hgb: 10.9,  troponin 88>223> 544,  bnp: 2046,  istat vbg:  pH: 7.404,  pc02 28.3,  BUN: 64,  creatinine: 11.40,   Assessment and Plan: Principal Problem:   Hypertensive emergency Active Problems:   Acute respiratory failure with hypoxia (HCC)   ESRD (end stage renal disease) on dialysis (HCC)   Elevated troponin   History of cerebrovascular accident (CVA) with residual deficit   AIDS (acquired immune deficiency syndrome) (HCC)   Seizure disorder (HCC)   DM2 (diabetes mellitus, type 2) (HCC)   GERD (gastroesophageal reflux disease)   Hyperlipidemia   Tobacco abuse    Assessment and Plan: * Hypertensive emergency 66 year old male with history of ESRD on dialysis presenting to ED in respiratory distress and hypertensive emergency after missing his HD session on Saturday. Initially on IV nitro for bp control, but was weaned off and improved after HD.  -obs to telemetry -presenting bp was 250/130 -elevated troponin, no chest pain or EKG changes.  -emergent dialysis with improvement in pressures and respiratory status, but still on 1-2L [place]. May need another session of dialysis  -resumed home medication of clonidine, coreg and hydralazine. Appears to be uncontrolled outpatient, consider titration of meds  -PRN IV hydralazine    Acute respiratory failure with hypoxia (HCC) Secondary to volume overload/pulmonary edema in setting of missed dialysis and HTN emergency. Was 89% on room air with EMS  Doing better s/p dialysis but still on 1-2L May need another session of dialysis Continue to wean as tolerated   ESRD (end stage renal disease) on dialysis ([place]) Usually TTS schedule. Missed his session on Saturday for his birthday S/p emergent HD per nephrology with improvement in respiratory status. May need repeat HD tomorrow.   Elevated troponin Troponin [phone] in setting of HTN emergency and pulmonary edema with respiratory distress/failure   He has had no chest pain or changes on EKG Likely demand ischemia in setting of HTN emergency and acute pulmonary edema Continue to trend troponin Echo pending   History of cerebrovascular accident (CVA) with residual deficit prior CVA with slurred speech and right hemiparesis at baseline.  -Continue Plavix and statin  AIDS (acquired immune deficiency syndrome) (HCC) Continue dovato and bactrim DS Followed by ID outpatient Needs cd4/HIV quant. Have ordered these    Seizure disorder [facility]) Seizure precautions Continue keppra   DM2 (diabetes mellitus, type 2) ([place]) A1C in 09/2021 was 5.2 Continue with semglee 5 units nightly SSI and accuchecks qac/hs   GERD (gastroesophageal reflux disease) Continue protonix daily   Hyperlipidemia Continue crestor   Tobacco abuse Declines nicotine patch     Advance Care Planning:   Code Status: Full Code   Consults: nephrology/pccm   DVT Prophylaxis: heparin [place]  Family Communication: none   Severity of Illness: The appropriate patient status for this patient is OBSERVATION. Observation status is judged to be reasonable and necessary in order to provide the required intensity of service to ensure the patient's safety. The patient's presenting symptoms, physical exam findings, and initial radiographic and laboratory data in the context of their medical condition is felt to place them at decreased risk for further clinical deterioration. Furthermore, it is anticipated that the patient will be medically stable for discharge from the hospital within 2 midnights of admission.   Author: Orma Flaming, MD 09/01/2022 6:37 PM  For on call review [email or web address].

## 2022-09-01 NOTE — Progress Notes (Signed)
Echocardiogram 2D Echocardiogram has been performed.  [name] 09/01/2022, 3:47 PM

## 2022-09-01 NOTE — Assessment & Plan Note (Addendum)
Continue dovato and bactrim DS Followed by ID outpatient Needs cd4/HIV quant. Have ordered these

## 2022-09-01 NOTE — Assessment & Plan Note (Addendum)
Acute pulmonary edema due to volume overload.  At the time of his discharge his 02 saturation is 100% on room air.

## 2022-09-01 NOTE — ED Notes (Signed)
Nitro rate increased to 60 mcg/hr per Ralene Bathe MD verbal order

## 2022-09-01 NOTE — Assessment & Plan Note (Deleted)
66 year old male with history of ESRD on dialysis presenting to ED in respiratory distress and hypertensive emergency after missing his HD session on Saturday. Initially on IV nitro for bp control, but was weaned off and improved after HD.  -obs to telemetry -presenting bp was 250/130 -elevated troponin, no chest pain or EKG changes.  -emergent dialysis with improvement in pressures and respiratory status, but still on 1-2L [place]. May need another session of dialysis  -resumed home medication of clonidine, coreg and hydralazine. Appears to be uncontrolled outpatient, consider titration of meds  -PRN IV hydralazine

## 2022-09-01 NOTE — Assessment & Plan Note (Signed)
A1C in 09/2021 was 5.2 Continue with semglee 5 units nightly SSI and accuchecks qac/hs

## 2022-09-01 NOTE — ED Notes (Signed)
Echo is at bedside

## 2022-09-01 NOTE — Assessment & Plan Note (Signed)
Continue crestor 

## 2022-09-01 NOTE — Consult Note (Signed)
NAME:  Richard [name], MRN:  [id number], DOB:  08-Feb-1956, LOS: 0 ADMISSION DATE:  08/31/2022, CONSULTATION DATE:  12/11 REFERRING MD:  Ralene Bathe, CHIEF COMPLAINT: respiratory distress  History of Present Illness:  Richard [name] is an 66 y.o. M who presented to the [facility] ED via EMS with a chief complaint of respiratory distress.   He has a pertinent past medical history of ESRD on HD (TTS), HIV, CVA with residual right-sided weakness, seizure disorder, DM 2, hypertension, tobacco abuse, noncompliance.  He reportedly missed dialysis on Saturday.  While at his nursing facility he developed shortness of breath.  He was transferred to [facility].  On arrival initial blood pressure 250/130.  He was started on nitro drip and BiPAP.  Nephrology was consulted.  Unable to take patient to emergent dialysis.  Of note patient recently admitted 11/22 - 08/15/2022 with hypertensive emergency secondary to noncompliance dialysis which was successfully treated with HD and resumption of home antihypertensives.  PCCM was consulted for admission.  Pertinent  Medical History  ESRD on HD (TTS), HIV, CVA with residual right-sided weakness, seizure disorder, DM 2, hypertension, tobacco abuse, noncompliance.  Significant Hospital Events: Including procedures, antibiotic start and stop dates in addition to other pertinent events   12/10 Presented to [facility] 12/ 11 Neph consult, PCCM consult  Interim History / Subjective:  See above  Nitro 80  On Bipap  Subjective: improved SOB, denies chest pain.   Objective   Blood pressure 139/75, pulse (!) 101, temperature 98.8 F (37.1 C), temperature source Oral, resp. rate 17, height 6' (1.829 m), weight 76.9 kg, SpO2 100 %.    Vent Mode: BIPAP FiO2 (%):  [40 %-50 %] 40 % Set Rate:  [15 bmp] 15 bmp  No intake or output data in the 24 hours ending 09/01/22 0424 Filed Weights   09/01/22 0417  Weight: 76.9 kg    Examination: General: In bed, NAD, appears  comfortable, chronically ill appearing HEENT: MM pink/moist, anicteric, atraumatic Neuro: RASS 0, PERRL 18m, GCS 15, contracted on right upper and right lower extremity with minimal movement, left extremities with no deficits CV: S1S2, NSR, no m/r/g appreciated PULM: Air movement all lobes lobes, trachea midline, chest expansion symmetric GI: soft, bsx4 active, non-tender   Extremities: warm/dry, no pretibial edema, capillary refill less than 3 seconds, left arm fistula with thrill Skin: HD catheter to right chest.  No rashes or lesions noted  Lab/imaging COVID  negative Blood cultures pending WBC 15.2 Hemoglobin 10.9 Platelet clumps Lactate 1.1 Troponin 88 > 223, BNP 2K VBG 7.4/28/137/17 0.7 Creatinine 10.8, BUN 44 Albumin 16 Chest x-ray with cardiomegaly and vascular congestion, no pneumothorax, no infiltrate, no significant pleural effusion Twelve-lead: Sinus tach, no significant ST changes noted.  Resolved Hospital Problem list     Assessment & Plan:  Hypertensive emergency secondary to missed hemodialysis session Troponin elevation secondary to hypertensive emergency from missed HD Patient reportedly missed dialysis on Saturday.  Presented with shortness of breath.   Of note patient recently admitted 11/22 - 08/15/2022 with hypertensive emergency secondary to noncompliance dialysis which was successfully treated with HD and resumption of home antihypertensives.  Troponin elevated secondary to missed HD.  No chest pain.  No ST changes on twelve-lead. -Continue nitro drip until HD.  Goal SBP 170-180.  Patient becomes symptomatic around SBP of 130 -Resume home antihypertensives: Coreg, hydralazine, clonidine -Recommend admission to hospitalist service post hemodialysis -Trend troponin  Acute respiratory failure with hypoxia secondary to volume overload from  missed HD and under dialysis last session COVID-negative. -Continue BiPAP until HD -Goal SpO2 92 to 98%  History CVA  with residual right-sided weakness -Continue Plavix and statin  Seizure disorder -Continue Keppra  HIV -Continue home treatment  DM 2 -Blood Glucose goal 140-180. -SSI  ESRD on HD (TTS) -Management per neph  Anemia of ESRD -Transfuse PRBC if HBG less than 7 -Obtain AM CBC to trend H&H -Monitor for signs of bleeding   Best Practice (right click and "Reselect all SmartList Selections" daily)   Diet/type: NPO w/ oral meds DVT prophylaxis: SCD GI prophylaxis: PPI Lines: Dialysis Catheter Foley:  N/A Code Status:  full code Last date of multidisciplinary goals of care discussion [Full scope]  Labs   CBC: Recent Labs  Lab 08/31/22 2342 08/31/22 2352 08/31/22 2353  WBC 15.2*  --   --   NEUTROABS 11.8*  --   --   HGB 10.9* 11.6* 11.6*  HCT 33.3* 34.0* 34.0*  MCV 100.9*  --   --   PLT PLATELET CLUMPS NOTED ON SMEAR, COUNT APPEARS ADEQUATE  --   --     Basic Metabolic Panel: Recent Labs  Lab 08/31/22 2352 08/31/22 2353 09/01/22 0151  NA 138 139 142  K 5.8* 5.9* 4.8  CL 110  --  104  CO2  --   --  22  GLUCOSE 133*  --  110*  BUN 64*  --  44*  CREATININE 11.40*  --  10.86*  CALCIUM  --   --  7.5*   GFR: Estimated Creatinine Clearance: 7.4 mL/min (A) (by C-G formula based on SCr of 10.86 mg/dL (H)). Recent Labs  Lab 08/31/22 2342 09/01/22 0122  WBC 15.2*  --   LATICACIDVEN  --  1.1    Liver Function Tests: Recent Labs  Lab 09/01/22 0151  AST 17  ALT 12  ALKPHOS 89  BILITOT 0.5  PROT 6.6  ALBUMIN 2.6*   No results for input(s): "LIPASE", "AMYLASE" in the last 168 hours. No results for input(s): "AMMONIA" in the last 168 hours.  ABG    Component Value Date/Time   PHART 7.346 (L) 04/12/2020 1856   PCO2ART 23.7 (L) 04/12/2020 1856   PO2ART 105 04/12/2020 1856   HCO3 17.7 (L) 08/31/2022 2353   TCO2 19 (L) 08/31/2022 2353   ACIDBASEDEF 6.0 (H) 08/31/2022 2353   O2SAT 99 08/31/2022 2353     Coagulation Profile: No results for input(s):  "INR", "PROTIME" in the last 168 hours.  Cardiac Enzymes: No results for input(s): "CKTOTAL", "CKMB", "CKMBINDEX", "TROPONINI" in the last 168 hours.  HbA1C: Hgb A1c MFr Bld  Date/Time Value Ref Range Status  10/21/2021 05:44 AM 5.2 4.8 - 5.6 % Final    Comment:    (NOTE) Pre diabetes:          5.7%-6.4%  Diabetes:              >6.4%  Glycemic control for   <7.0% adults with diabetes   01/13/2021 01:00 AM 5.6 4.8 - 5.6 % Final    Comment:    (NOTE) Pre diabetes:          5.7%-6.4%  Diabetes:              >6.4%  Glycemic control for   <7.0% adults with diabetes     CBG: No results for input(s): "GLUCAP" in the last 168 hours.  Review of Systems:   ROS limited due to patient on BiPAP.  Endorses shortness  of breath.  Past Medical History:  He,  has a past medical history of COVID-19 (05/16/2020), Diabetes mellitus, type II, insulin dependent ([place]), ESRD on hemodialysis ([place]) (08/30/2021), GERD (gastroesophageal reflux disease), Hematoma (03/19/2022), HIV (human immunodeficiency virus infection) ([place]), Hypertension, Meningoencephalitis, Neuropathy in diabetes [facility]), Recurrent falls, Ruptured lumbar disc, Seizure disorder ([place]), and Stroke ([place]) (2013).   Surgical History:   Past Surgical History:  Procedure Laterality Date   AV FISTULA PLACEMENT Left 04/25/2020   Procedure: LEFT ARM BRACHIOCEPHALIC ARTERIOVENOUS (AV) FISTULA CREATION;  Surgeon: Rosetta Posner, MD;  Location: [facility];  Service: Vascular;  Laterality: Left;   AV FISTULA PLACEMENT Left 02/28/2022   Procedure: LEFT ARM INSERTION OF ARTERIOVENOUS (AV) GORE-TEX GRAFT ARM;  Surgeon: Marty Heck, MD;  Location: [facility];  Service: Vascular;  Laterality: Left;   IR FLUORO GUIDE CV LINE RIGHT  04/13/2020   IR FLUORO GUIDE CV LINE RIGHT  01/31/2022   IR REMOVAL TUN CV CATH W/O FL  01/14/2021   IR US GUIDE VASC ACCESS RIGHT  04/13/2020   IR US GUIDE VASC ACCESS RIGHT  01/31/2022   IR US GUIDE VASC ACCESS RIGHT  01/31/2022    KNEE ARTHROSCOPY Bilateral      Social History:   reports that he has been smoking cigarettes. He has a 17.50 pack-year smoking history. He has been exposed to tobacco smoke. He has never used smokeless tobacco. He reports that he does not currently use drugs after having used the following drugs: Marijuana. He reports that he does not drink alcohol.   Family History:  His family history includes Breast cancer in his sister; Diabetes in his sister; Epilepsy in his father; Heart attack in his brother; Hypertension in his father; Other in his mother; Stroke in his brother and father.   Allergies Allergies  Allergen Reactions   Oxycodone Nausea And Vomiting    NOT DOCUMENTED ON MAR     Home Medications  Prior to Admission medications   Medication Sig Start Date End Date Taking? Authorizing Provider  acetaminophen (TYLENOL) 325 MG tablet Take 650 mg by mouth every 6 (six) hours as needed for moderate pain, fever or mild pain.   Yes [provider]  b complex-vitamin c-folic acid (NEPHRO-VITE) 0.8 MG TABS tablet Take 1 tablet by mouth daily.   Yes [provider]  calcium acetate (PHOSLO) 667 MG capsule Take 2,001 mg by mouth 3 (three) times daily with meals.   Yes [provider]  carvedilol (COREG) 12.5 MG tablet Take 12.5 mg by mouth 2 (two) times daily.   Yes [provider]  cloNIDine (CATAPRES) 0.1 MG tablet Take 1 tablet (0.1 mg total) by mouth 3 (three) times daily. 02/21/21  Yes Hosie Poisson, MD  clopidogrel (PLAVIX) 75 MG tablet Take 1 tablet (75 mg total) by mouth daily. 03/07/20  Yes Koberlein, Junell C, MD  docusate sodium (COLACE) 100 MG capsule Take 100 mg by mouth daily.   Yes [provider]  dolutegravir-lamiVUDine (DOVATO) 50-300 MG tablet Take 1 tablet by mouth daily. 03/19/22  Yes Tommy Medal, Lavell Islam, MD  doxepin (SINEQUAN) 75 MG capsule Take 75 mg by mouth at bedtime.   Yes [provider]  Ergocalciferol 50 MCG (2000  UT) TABS Take 2,000 Units by mouth daily.   Yes [provider]  hydrALAZINE (APRESOLINE) 10 MG tablet Take 10 mg by mouth 3 (three) times daily.   Yes [provider]  hydrocortisone cream 1 % Apply 1 Application  topically every 6 (six) hours as needed for itching. Apply to affected areas on back arms   Yes [provider]  insulin glargine-yfgn (SEMGLEE) 100 UNIT/ML Pen Inject 5 Units into the skin at bedtime.   Yes [provider]  levETIRAcetam (KEPPRA) 500 MG tablet Take 500 mg by mouth 2 (two) times daily.   Yes [provider]  Lidocaine 3 % CREA Apply 1 application topically See admin instructions. Apply 1 times a day on Tuesday, Thursday and Saturday prior to dialysis   Yes [provider]  Nutritional Supplements (FEEDING SUPPLEMENT, NEPRO CARB STEADY,) LIQD Take 237 mLs by mouth 3 (three) times daily between meals.   Yes [provider]  ondansetron (ZOFRAN) 4 MG tablet Take 4 mg by mouth every 8 (eight) hours as needed for vomiting or nausea. 12/09/21  Yes [provider]  pantoprazole (PROTONIX) 40 MG tablet TAKE 1 TABLET BY MOUTH EVERY DAY Patient taking differently: Take 40 mg by mouth daily. 01/23/20  Yes Koberlein, Junell C, MD  rosuvastatin (CRESTOR) 5 MG tablet Take 5 mg by mouth daily. 12/03/21  Yes [provider]  sulfamethoxazole-trimethoprim (BACTRIM DS) 800-160 MG tablet Take 1 tablet by mouth 3 (three) times a week. Patient taking differently: Take 1 tablet by mouth every Monday, Wednesday, and Friday. Once daily on M-W F continuous 04/23/20  Yes Little Ishikawa, MD  tamsulosin (FLOMAX) 0.4 MG CAPS capsule TAKE 1 CAPSULE BY MOUTH EVERY DAY Patient taking differently: Take 0.4 mg by mouth daily. 03/28/20  Yes Koberlein, [name], MD  traMADol (ULTRAM) 50 MG tablet Take 50 mg by mouth every 8 (eight) hours as needed (for pain in right hip area).   Yes [provider]  triamcinolone cream  (KENALOG) 0.1 % Apply 1 Application topically in the morning and at bedtime. Apply to back and arms for rash twice a day 08/07/22  Yes [provider]  [place] test strip USE 1 strip TO test blood sugar 2 TIMES DAILY TO 3 TIMES DAILY 06/10/19   [name], MD  blood glucose meter kit and supplies KIT Dispense based on patient and insurance preference. Check blood sugar 4 times a day 06/11/18   [name], MD  epoetin alfa-epbx (RETACRIT) 18563 UNIT/ML injection Inject 20,000 Units into the skin every Monday.    [provider]  Insulin Pen Needle (SURE COMFORT PEN NEEDLES) 31G X 8 MM MISC Use as directed twice daily with Novolog flex pen 02/10/19   [name], MD  Misc. Devices (COMMODE 3-IN-1) MISC Use as directed 02/24/20   [name], MD  Misc. Devices (TRANSFER BENCH) MISC Use as directed 02/24/20   [name], MD  TRUEPLUS INSULIN SYRINGE 31G X 5/16" 1 ML MISC Use pen needle with insulin 2 times daily 03/31/19   [name], MD     Critical care time: 35 minutes     The patient is critically ill with multiple organ systems failure and requires high complexity decision making for assessment and support, frequent evaluation and titration of therapies, application of advanced monitoring technologies and extensive interpretation of multiple databases.    Critical Care Time devoted to patient care services described in this note is 35 minutes. This time reflects time of care of this [place] NP. This critical care time does not reflect procedure time but could involve care discussion time with the PCCM attending.  Redmond School., MSN, APRN, AGACNP-BC  [place] [facility]  09/01/2022 , 4:47 AM  Please see [email or web address] for pager details  If no response, please call [phone] After hours, please call Elink at [phone]

## 2022-09-02 DIAGNOSIS — I2489 Other forms of acute ischemic heart disease: Secondary | ICD-10-CM

## 2022-09-02 DIAGNOSIS — I161 Hypertensive emergency: Secondary | ICD-10-CM | POA: Diagnosis not present

## 2022-09-02 LAB — BASIC METABOLIC PANEL
Anion gap: 14 (ref 5–15)
BUN: 19 mg/dL (ref 8–23)
CO2: 27 mmol/L (ref 22–32)
Calcium: 8.1 mg/dL — ABNORMAL LOW (ref 8.9–10.3)
Chloride: 98 mmol/L (ref 98–111)
Creatinine, Ser: 6.34 mg/dL — ABNORMAL HIGH (ref 0.61–1.24)
GFR, Estimated: 9 mL/min — ABNORMAL LOW (ref >60)
Glucose, Bld: 86 mg/dL (ref 70–99)
Potassium: 3.7 mmol/L (ref 3.5–5.1)
Sodium: 139 mmol/L (ref 135–145)

## 2022-09-02 LAB — CBC
HCT: 30.1 % — ABNORMAL LOW (ref 39.0–52.0)
Hemoglobin: 9.5 g/dL — ABNORMAL LOW (ref 13.0–17.0)
MCH: 31.8 pg (ref 26.0–34.0)
MCHC: 31.6 g/dL (ref 30.0–36.0)
MCV: 100.7 fL — ABNORMAL HIGH (ref 80.0–100.0)
Platelets: 173 10*3/uL (ref 150–400)
RBC: 2.99 MIL/uL — ABNORMAL LOW (ref 4.22–5.81)
RDW: 16 % — ABNORMAL HIGH (ref 11.5–15.5)
WBC: 4.7 10*3/uL (ref 4.0–10.5)
nRBC: 0.6 % — ABNORMAL HIGH (ref 0.0–0.2)

## 2022-09-02 LAB — TROPONIN I (HIGH SENSITIVITY)
Troponin I (High Sensitivity): 547 ng/L (ref <18)
Troponin I (High Sensitivity): 429 ng/L (ref <18)

## 2022-09-02 LAB — GLUCOSE, CAPILLARY
Glucose-Capillary: 164 mg/dL — ABNORMAL HIGH (ref 70–99)
Glucose-Capillary: 178 mg/dL — ABNORMAL HIGH (ref 70–99)
Glucose-Capillary: 56 mg/dL — ABNORMAL LOW (ref 70–99)
Glucose-Capillary: 63 mg/dL — ABNORMAL LOW (ref 70–99)
Glucose-Capillary: 83 mg/dL (ref 70–99)
Glucose-Capillary: 86 mg/dL (ref 70–99)
Glucose-Capillary: 94 mg/dL (ref 70–99)

## 2022-09-02 LAB — HEPATITIS B SURFACE ANTIBODY, QUANTITATIVE: Hep B S AB Quant (Post): <3.1 m[IU]/mL — ABNORMAL LOW (ref >9.9)

## 2022-09-02 MED ORDER — HEPARIN SODIUM (PORCINE) 1000 UNIT/ML DIALYSIS
1500.0000 [IU] | INTRAMUSCULAR | Status: DC | PRN
  Administered 2022-09-02: 1500 [IU] via INTRAVENOUS_CENTRAL
  Filled 2022-09-02: qty 2

## 2022-09-02 NOTE — Care Management CC44 (Signed)
Condition Code 44 Documentation Completed  Patient Details  Name: Dannel Rafter MRN: [id number] Date of Birth: 07/26/1956   Condition Code 44 given:  Yes Patient signature on Condition Code 44 notice:  Yes Documentation of 2 MD's agreement:  Yes Code 44 added to claim:  Yes    Tom-Johnson, Renea Ee, RN 09/02/2022, 4:56 PM

## 2022-09-02 NOTE — Progress Notes (Signed)
PROGRESS NOTE    Richard [name]  SEG:[id number] DOB: Apr 25, 1956 DOA: 08/31/2022  PCP: Jodi Marble, MD   Brief Narrative:  This 66 yrs old Male with PMH significant of AIDS, ESRD on dialysis TTS, HTN, T2DM, HLD,  hx. of CVA, GERD, Seizure disorder who presented to ED with c/o: Shortness of breath / respiratory distress after missing dialysis on 08/30/22.  Patient has developed severe shortness of breath.  EMS was called by his roommate and he was found to have SpO2 of 89% on room air and was placed on CPAP and was treated with 2 sublingual nitro for hypertension.  Patient reports one episode of vomiting on his way over to the hospital.  He also found to have swelling in both legs. Patient has last hemodialysis session last Thursday,  He has missed his Saturday session due to his birthday.  Blood pressure in the ED found to be 241/117, HR 115, RR 25, SpO2 100% on nonrebreather.  Chest x-ray showed increased vascular congestion with possible central perihilar edema.  Patient is admitted for fluid overload possibly due to missed hemodialysis.  Nephrology is consulted.    Assessment & Plan:   Principal Problem:   Hypertensive emergency Active Problems:   Acute respiratory failure with hypoxia (HCC)   ESRD (end stage renal disease) on dialysis (HCC)   Elevated troponin   History of cerebrovascular accident (CVA) with residual deficit   AIDS (acquired immune deficiency syndrome) (HCC)   Seizure disorder ([place])   DM2 (diabetes mellitus, type 2) ([place])   GERD (gastroesophageal reflux disease)   Hyperlipidemia   Tobacco abuse   HTN Emergency: Patient with hx. of ESRD on HD TTS presented with respiratory distress and elevated HTN after missing his hemodialysis session.  He was initially started on IV nitro drip for blood pressure control but then weaned off as BP improved after hemodialysis. Slightly elevated troponin but denies any chest pain or  no EKG changes. Patient underwent  emergent dialysis with improvement in blood pressure and respiratory status. He is now weaned down to 2 L of supplemental oxygen. Resumed his home blood pressure medications. Continue clonidine,  Coreg,  hydralazine.   Acute hypoxic respiratory failure: Secondary to volume overload / pulmonary edema in the setting of missed hemodialysis and elevated blood pressure. SpO2 89% on room air,  placed on nonrebreather and now back to nasal cannula. Doing better s/p dialysis but still on 1-2L May need another session of dialysis. Continue to wean as tolerated.    ESRD: Patient is on hemodialysis TTS schedule Missed his session on Saturday for his birthday S/p emergent HD per nephrology with improvement in respiratory status.  May need repeat HD tomorrow.    Elevated troponin : Troponin 73>710>626 >547 in setting of HTN emergency and pulmonary edema with respiratory distress/failure . Patient denies any chest pain, there is no EKG changes. Likely demand ischemia in setting of HTN emergency and acute pulmonary edema Continue to trend troponin Echo: Mildly decreased LVEF 45 to 50%.  [facility]iology consulted.   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  Continue Plavix and statin.   AIDS: Continue dovato and bactrim DS. Followed by ID outpatient Follow-up CD4 count/HIV quant.  Seizure disorder Continue Keppra Seizure precautions:  Diabetes mellitus II: HbA1c 5.2 Continue Semglee 5 units nightly Continue sliding scale.  GERD (gastroesophageal reflux disease) Continue protonix daily.   Hyperlipidemia Continue Crestor.   Tobacco abuse Declines nicotine  patch   DVT prophylaxis: Heparin sq Code Status:Full code Family Communication: No family at bed side Disposition Plan:   Status is: Observation The patient remains OBS appropriate and will d/c before 2 midnights.   Admitted for fluid overload and hypertensive  emergency secondary to missed hemodialysis which has improved after completion of hemodialysis.  Consultants:  Nephrology Cardiology  Procedures: Hemodialysis Antimicrobials:  Anti-infectives (From admission, onward)    Start     Dose/Rate Route Frequency Ordered Stop   09/01/22 1500  sulfamethoxazole-trimethoprim (BACTRIM DS) 800-160 MG per tablet 1 tablet       Note to Pharmacy: Patient taking differently: Once daily on M-W F continuous     1 tablet Oral Every M-W-F 09/01/22 1357     09/01/22 1000  dolutegravir-lamiVUDine (DOVATO) 50-300 MG per tablet 1 tablet        1 tablet Oral Daily 09/01/22 0528        Subjective: Patient was seen and examined at bedside.  Overnight events noted. Patient denies any chest pain or shortness of breath.   He reports after dialysis his blood pressure is improved and he feels better as well.  Objective: Vitals:   09/01/22 1651 09/01/22 2141 09/02/22 0415 09/02/22 0851  BP: (!) 191/67 (!) 183/71 134/66 (!) 146/73  Pulse: 92 87 84 88  Resp: 18 18 18 18   Temp: 98.7 F (37.1 C) 98.7 F (37.1 C) 97.6 F (36.4 C) 98 F (36.7 C)  TempSrc: Oral Oral  Oral  SpO2: 100% 99% 100% 100%  Weight:      Height:        Intake/Output Summary (Last 24 hours) at 09/02/2022 1400 Last data filed at 09/02/2022 0805 Gross per 24 hour  Intake 360 ml  Output 300 ml  Net 60 ml   Filed Weights   09/01/22 0417  Weight: 76.9 kg    Examination:  General exam: Appears comfortable, not in any acute distress.  Deconditioned. Respiratory system: CTA bilaterally, respiratory for normal, RR 15. Cardiovascular system: S1 & S2 heard, regular rate and rhythm, no murmur. Gastrointestinal system: Abdomen is soft, nontender, nondistended, BS+ Central nervous system: Alert and oriented x 3. No focal neurological deficits. Extremities: Edema+, no cyanosis, no clubbing Skin: No rashes, lesions or ulcers Psychiatry: Judgement and insight appear normal. Mood & affect  appropriate.     Data Reviewed: I have personally reviewed following labs and imaging studies  CBC: Recent Labs  Lab 08/31/22 2342 08/31/22 2352 08/31/22 2353 09/01/22 0834 09/02/22 0535  WBC 15.2*  --   --  7.8 4.7  NEUTROABS 11.8*  --   --   --   --   HGB 10.9* 11.6* 11.6* 8.4* 9.5*  HCT 33.3* 34.0* 34.0* 25.2* 30.1*  MCV 100.9*  --   --  99.6 100.7*  PLT PLATELET CLUMPS NOTED ON SMEAR, COUNT APPEARS ADEQUATE  --   --  189 989   Basic Metabolic Panel: Recent Labs  Lab 08/31/22 2352 08/31/22 2353 09/01/22 0151 09/02/22 0535  NA 138 139 142 139  K 5.8* 5.9* 4.8 3.7  CL 110  --  104 98  CO2  --   --  22 27  GLUCOSE 133*  --  110* 86  BUN 64*  --  44* 19  CREATININE 11.40*  --  10.86* 6.34*  CALCIUM  --   --  7.5* 8.1*   GFR: Estimated Creatinine Clearance: 12.6 mL/min (A) (by C-G formula based on SCr of 6.34 mg/dL (H)).  Liver Function Tests: Recent Labs  Lab 09/01/22 0151  AST 17  ALT 12  ALKPHOS 89  BILITOT 0.5  PROT 6.6  ALBUMIN 2.6*   No results for input(s): "LIPASE", "AMYLASE" in the last 168 hours. No results for input(s): "AMMONIA" in the last 168 hours. Coagulation Profile: No results for input(s): "INR", "PROTIME" in the last 168 hours. Cardiac Enzymes: No results for input(s): "CKTOTAL", "CKMB", "CKMBINDEX", "TROPONINI" in the last 168 hours. BNP (last 3 results) No results for input(s): "PROBNP" in the last 8760 hours. HbA1C: No results for input(s): "HGBA1C" in the last 72 hours. CBG: Recent Labs  Lab 09/02/22 0413 09/02/22 0719 09/02/22 0755 09/02/22 1118 09/02/22 1127  GLUCAP 63* 56* 83 178* 94   Lipid Profile: No results for input(s): "CHOL", "HDL", "LDLCALC", "TRIG", "CHOLHDL", "LDLDIRECT" in the last 72 hours. Thyroid Function Tests: No results for input(s): "TSH", "T4TOTAL", "FREET4", "T3FREE", "THYROIDAB" in the last 72 hours. Anemia Panel: No results for input(s): "VITAMINB12", "FOLATE", "FERRITIN", "TIBC", "IRON",  "RETICCTPCT" in the last 72 hours. Sepsis Labs: Recent Labs  Lab 09/01/22 0122 09/01/22 0415  LATICACIDVEN 1.1 1.1    Recent Results (from the past 240 hour(s))  Resp panel by RT-PCR (RSV, Flu A&B, Covid) Anterior Nasal Swab     Status: None   Collection Time: 08/31/22 11:13 PM   Specimen: Anterior Nasal Swab  Result Value Ref Range Status   SARS Coronavirus 2 by RT PCR NEGATIVE NEGATIVE Final    Comment: (NOTE) SARS-CoV-2 target nucleic acids are NOT DETECTED.  The SARS-CoV-2 RNA is generally detectable in upper respiratory specimens during the acute phase of infection. The lowest concentration of SARS-CoV-2 viral copies this assay can detect is 138 copies/mL. A negative result does not preclude SARS-Cov-2 infection and should not be used as the sole basis for treatment or other patient management decisions. A negative result may occur with  improper specimen collection/handling, submission of specimen other than nasopharyngeal swab, presence of viral mutation(s) within the areas targeted by this assay, and inadequate number of viral copies(<138 copies/mL). A negative result must be combined with clinical observations, patient history, and epidemiological information. The expected result is Negative.  Fact Sheet for Patients:  EntrepreneurPulse.com.au  Fact Sheet for Healthcare Providers:  IncredibleEmployment.be  This test is no t yet approved or cleared by the [place] FDA and  has been authorized for detection and/or diagnosis of SARS-CoV-2 by FDA under an Emergency Use Authorization (EUA). This EUA will remain  in effect (meaning this test can be used) for the duration of the COVID-19 declaration under Section 564(b)(1) of the Act, 21 U.S.C.section 360bbb-3(b)(1), unless the authorization is terminated  or revoked sooner.       Influenza A by PCR NEGATIVE NEGATIVE Final   Influenza B by PCR NEGATIVE NEGATIVE Final     Comment: (NOTE) The Xpert Xpress SARS-CoV-2/FLU/RSV plus assay is intended as an aid in the diagnosis of influenza from Nasopharyngeal swab specimens and should not be used as a sole basis for treatment. Nasal washings and aspirates are unacceptable for Xpert Xpress SARS-CoV-2/FLU/RSV testing.  Fact Sheet for Patients: EntrepreneurPulse.com.au  Fact Sheet for Healthcare Providers: IncredibleEmployment.be  This test is not yet approved or cleared by the [place] FDA and has been authorized for detection and/or diagnosis of SARS-CoV-2 by FDA under an Emergency Use Authorization (EUA). This EUA will remain in effect (meaning this test can be used) for the duration of the COVID-19 declaration under Section 564(b)(1) of the Act, 21  U.S.C. section 360bbb-3(b)(1), unless the authorization is terminated or revoked.     Resp Syncytial Virus by PCR NEGATIVE NEGATIVE Final    Comment: (NOTE) Fact Sheet for Patients: EntrepreneurPulse.com.au  Fact Sheet for Healthcare Providers: IncredibleEmployment.be  This test is not yet approved or cleared by the [place] FDA and has been authorized for detection and/or diagnosis of SARS-CoV-2 by FDA under an Emergency Use Authorization (EUA). This EUA will remain in effect (meaning this test can be used) for the duration of the COVID-19 declaration under Section 564(b)(1) of the Act, 21 U.S.C. section 360bbb-3(b)(1), unless the authorization is terminated or revoked.  Performed at [facility], [place] [address] [place], [place] [address]   Culture, blood (routine x 2)     Status: None (Preliminary result)   Collection Time: 09/01/22  1:22 AM   Specimen: BLOOD  Result Value Ref Range Status   Specimen Description BLOOD RIGHT ANTECUBITAL  Final   Special Requests   Final    BOTTLES DRAWN AEROBIC AND ANAEROBIC Blood Culture adequate volume   Culture   Final     NO GROWTH < 12 HOURS Performed at [facility], [place] [address] [place], [place] [address]    Report Status PENDING  Incomplete         Radiology Studies: ECHOCARDIOGRAM COMPLETE  Result Date: 09/01/2022    ECHOCARDIOGRAM REPORT   Patient Name:   TRAYVOND VIETS Date of Exam: 09/01/2022 Medical Rec #:  [id number]             Height:       72.0 in Accession #:    [id number]            Weight:       169.5 lb Date of Birth:  Mar 02, 1956             BSA:          1.986 m Patient Age:    64 years              BP:           142/118 mmHg Patient Gender: M                     HR:           96 bpm. Exam Location:  Inpatient Procedure: 2D Echo, Cardiac Doppler and Color Doppler Indications:    Elevated Troponin  History:        Patient has prior history of Echocardiogram examinations, most                 recent 10/20/2021. Stroke; Risk Factors:Hypertension, Diabetes                 and GERD.  Sonographer:    Bernadene Person RDCS Referring Phys: [id number] [place]  1. Left ventricular ejection fraction, by estimation, is 45 to 50%. The left ventricle has mildly decreased function. The left ventricle demonstrates regional wall motion abnormalities (see scoring diagram/findings for description). There is moderate left ventricular hypertrophy. Left ventricular diastolic parameters are consistent with Grade I diastolic dysfunction (impaired relaxation). Elevated left ventricular end-diastolic pressure. There is hypokinesis of the left ventricular, entire inferior wall.  2. Right ventricular systolic function is normal. The right ventricular size is normal. Tricuspid regurgitation signal is inadequate for assessing PA pressure.  3. Left atrial size was mild to moderately dilated.  4. The mitral valve is normal in structure. No  evidence of mitral valve regurgitation. No evidence of mitral stenosis.  5. The aortic valve is tricuspid. There is mild calcification of the aortic valve. Aortic valve  regurgitation is not visualized. Aortic valve sclerosis is present, with no evidence of aortic valve stenosis.  6. The inferior vena cava is normal in size with greater than 50% respiratory variability, suggesting right atrial pressure of 3 mmHg. Comparison(s): No significant change from prior study. FINDINGS  Left Ventricle: Left ventricular ejection fraction, by estimation, is 45 to 50%. The left ventricle has mildly decreased function. The left ventricle demonstrates regional wall motion abnormalities. The left ventricular internal cavity size was normal in size. There is moderate left ventricular hypertrophy. Left ventricular diastolic parameters are consistent with Grade I diastolic dysfunction (impaired relaxation). Elevated left ventricular end-diastolic pressure. Right Ventricle: The right ventricular size is normal. No increase in right ventricular wall thickness. Right ventricular systolic function is normal. Tricuspid regurgitation signal is inadequate for assessing PA pressure. Left Atrium: Left atrial size was mild to moderately dilated. Right Atrium: Right atrial size was normal in size. Pericardium: There is no evidence of pericardial effusion. Mitral Valve: The mitral valve is normal in structure. No evidence of mitral valve regurgitation. No evidence of mitral valve stenosis. Tricuspid Valve: The tricuspid valve is normal in structure. Tricuspid valve regurgitation is not demonstrated. No evidence of tricuspid stenosis. Aortic Valve: The aortic valve is tricuspid. There is mild calcification of the aortic valve. Aortic valve regurgitation is not visualized. Aortic valve sclerosis is present, with no evidence of aortic valve stenosis. Pulmonic Valve: The pulmonic valve was not well visualized. Pulmonic valve regurgitation is not visualized. No evidence of pulmonic stenosis. Aorta: The aortic root, ascending aorta and aortic arch are all structurally normal, with no evidence of dilitation or  obstruction. Venous: The inferior vena cava is normal in size with greater than 50% respiratory variability, suggesting right atrial pressure of 3 mmHg. IAS/Shunts: The atrial septum is grossly normal.  LEFT VENTRICLE PLAX 2D LVIDd:         4.90 cm      Diastology LVIDs:         3.70 cm      LV e' medial:    3.00 cm/s LV PW:         1.92 cm      LV E/e' medial:  24.2 LV IVS:        1.83 cm      LV e' lateral:   3.43 cm/s LVOT diam:     2.20 cm      LV E/e' lateral: 21.2 LV SV:         69 LV SV Index:   35 LVOT Area:     3.80 cm  LV Volumes (MOD) LV vol d, MOD A2C: 121.0 ml LV vol d, MOD A4C: 160.0 ml LV vol s, MOD A2C: 63.6 ml LV vol s, MOD A4C: 94.3 ml LV SV MOD A2C:     57.4 ml LV SV MOD A4C:     160.0 ml LV SV MOD BP:      62.4 ml RIGHT VENTRICLE RV S prime:     13.90 cm/s TAPSE (M-mode): 2.5 cm LEFT ATRIUM             Index        RIGHT ATRIUM           Index LA diam:        4.30 cm 2.17 cm/m   RA  Area:     15.30 cm LA Vol (A2C):   75.3 ml 37.91 ml/m  RA Volume:   34.90 ml  17.57 ml/m LA Vol (A4C):   56.2 ml 28.30 ml/m LA Biplane Vol: 66.8 ml 33.63 ml/m  AORTIC VALVE LVOT Vmax:   110.00 cm/s LVOT Vmean:  70.200 cm/s LVOT VTI:    0.182 m  AORTA Ao Root diam: 3.40 cm Ao Asc diam:  3.30 cm MITRAL VALVE MV Area (PHT): 6.17 cm     SHUNTS MV Decel Time: 123 msec     Systemic VTI:  0.18 m MV E velocity: 72.70 cm/s   Systemic Diam: 2.20 cm MV A velocity: 137.00 cm/s MV E/A ratio:  0.53 Buford Dresser MD Electronically signed by Buford Dresser MD Signature Date/Time: 09/01/2022/5:09:38 PM    Final    DG Chest Port 1 View  Result Date: 08/31/2022 CLINICAL DATA:  Shortness of breath. EXAM: PORTABLE CHEST 1 VIEW COMPARISON:  Radiograph 08/14/2019 FINDINGS: Right-sided dialysis catheter in place. Stable cardiomegaly. Vascular congestion with possible central perihilar edema. No large pleural effusion on this AP view. No confluent airspace disease. No pneumothorax. Skin fold projects over the left  hemithorax. IMPRESSION: Cardiomegaly with vascular congestion and possible central perihilar edema. Electronically Signed   By: Keith Rake M.D.   On: 08/31/2022 23:35        Scheduled Meds:  calcium acetate  2,001 mg Oral TID WC   carvedilol  12.5 mg Oral BID WC   Chlorhexidine Gluconate Cloth  6 each Topical Q0600   cloNIDine  0.1 mg Oral TID   clopidogrel  75 mg Oral Daily   darbepoetin (ARANESP) injection - DIALYSIS  100 mcg Subcutaneous Q Mon-1800   docusate sodium  100 mg Oral Daily   dolutegravir-lamiVUDine  1 tablet Oral Daily   doxepin  75 mg Oral QHS   feeding supplement (NEPRO CARB STEADY)  237 mL Oral TID BM   heparin  5,000 Units Subcutaneous Q8H   hydrALAZINE  10 mg Oral Q8H   insulin aspart  0-6 Units Subcutaneous Q4H   insulin glargine-yfgn  5 Units Subcutaneous QHS   levETIRAcetam  500 mg Oral BID   multivitamin  1 tablet Oral QHS   pantoprazole  40 mg Oral Daily   rosuvastatin  5 mg Oral Daily   sodium chloride flush  3 mL Intravenous Q12H   sulfamethoxazole-trimethoprim  1 tablet Oral Q M,W,F   tamsulosin  0.4 mg Oral Daily   Continuous Infusions:  sodium chloride     anticoagulant sodium citrate       LOS: 1 day    Time spent: 50 mins    Christine Morton, MD [facility]   If 7PM-7AM, please contact night-coverage

## 2022-09-02 NOTE — Progress Notes (Signed)
[facility] Progress Note   Subjective:   Tolerated 3.5L UF yesterday, reports he is still mildly SOB and nausea. Today is his normal HD day, advised he should have HD today to improve volume status and get back on schedule, he says he will think about it.   Objective Vitals:   09/01/22 1651 09/01/22 2141 09/02/22 0415 09/02/22 0851  BP: (!) 191/67 (!) 183/71 134/66 (!) 146/73  Pulse: 92 87 84 88  Resp: 18 18 18 18   Temp: 98.7 F (37.1 C) 98.7 F (37.1 C) 97.6 F (36.4 C) 98 F (36.7 C)  TempSrc: Oral Oral  Oral  SpO2: 100% 99% 100% 100%  Weight:      Height:       Physical Exam General: Alert male in NAD, on O2 via [place] Heart: RRR, no murmurs, rubs or gallops Lungs: scattered rales b/l lower lobes Abdomen: Soft, non-distended, +BS Extremities: 2+ LE edema Dialysis Access: [facility] with intact bandage  Additional Objective Labs: Basic Metabolic Panel: Recent Labs  Lab 08/31/22 2352 08/31/22 2353 09/01/22 0151 09/02/22 0535  NA 138 139 142 139  K 5.8* 5.9* 4.8 3.7  CL 110  --  104 98  CO2  --   --  22 27  GLUCOSE 133*  --  110* 86  BUN 64*  --  44* 19  CREATININE 11.40*  --  10.86* 6.34*  CALCIUM  --   --  7.5* 8.1*   Liver Function Tests: Recent Labs  Lab 09/01/22 0151  AST 17  ALT 12  ALKPHOS 89  BILITOT 0.5  PROT 6.6  ALBUMIN 2.6*   No results for input(s): "LIPASE", "AMYLASE" in the last 168 hours. CBC: Recent Labs  Lab 08/31/22 2342 08/31/22 2352 08/31/22 2353 09/01/22 0834 09/02/22 0535  WBC 15.2*  --   --  7.8 4.7  NEUTROABS 11.8*  --   --   --   --   HGB 10.9*   < > 11.6* 8.4* 9.5*  HCT 33.3*   < > 34.0* 25.2* 30.1*  MCV 100.9*  --   --  99.6 100.7*  PLT PLATELET CLUMPS NOTED ON SMEAR, COUNT APPEARS ADEQUATE  --   --  189 173   < > = values in this interval not displayed.   Blood Culture    Component Value Date/Time   SDES BLOOD RIGHT ANTECUBITAL 09/01/2022 0122   SPECREQUEST  09/01/2022 0122    BOTTLES DRAWN AEROBIC AND  ANAEROBIC Blood Culture adequate volume   CULT  09/01/2022 0122    NO GROWTH < 12 HOURS Performed at [facility], [place] [address] [place], [place] [address]    REPTSTATUS PENDING 09/01/2022 0122    Cardiac Enzymes: No results for input(s): "CKTOTAL", "CKMB", "CKMBINDEX", "TROPONINI" in the last 168 hours. CBG: Recent Labs  Lab 09/02/22 0001 09/02/22 0413 09/02/22 0719 09/02/22 0755 09/02/22 1118  GLUCAP 86 63* 56* 83 178*   Iron Studies: No results for input(s): "IRON", "TIBC", "TRANSFERRIN", "FERRITIN" in the last 72 hours. @lablastinr3 @ Studies/Results: ECHOCARDIOGRAM COMPLETE  Result Date: 09/01/2022    ECHOCARDIOGRAM REPORT   Patient Name:   JB DULWORTH Date of Exam: 09/01/2022 Medical Rec #:  [id number]             Height:       72.0 in Accession #:    [id number]            Weight:       169.5 lb Date  of Birth:  [birth year]             BSA:          1.986 m Patient Age:    52 years              BP:           142/118 mmHg Patient Gender: M                     HR:           96 bpm. Exam Location:  Inpatient Procedure: 2D Echo, Cardiac Doppler and Color Doppler Indications:    Elevated Troponin  History:        Patient has prior history of Echocardiogram examinations, most                 recent 10/20/2021. Stroke; Risk Factors:Hypertension, Diabetes                 and GERD.  Sonographer:    Bernadene Person RDCS Referring Phys: [id number] [place]  1. Left ventricular ejection fraction, by estimation, is 45 to 50%. The left ventricle has mildly decreased function. The left ventricle demonstrates regional wall motion abnormalities (see scoring diagram/findings for description). There is moderate left ventricular hypertrophy. Left ventricular diastolic parameters are consistent with Grade I diastolic dysfunction (impaired relaxation). Elevated left ventricular end-diastolic pressure. There is hypokinesis of the left ventricular, entire inferior wall.  2. Right  ventricular systolic function is normal. The right ventricular size is normal. Tricuspid regurgitation signal is inadequate for assessing PA pressure.  3. Left atrial size was mild to moderately dilated.  4. The mitral valve is normal in structure. No evidence of mitral valve regurgitation. No evidence of mitral stenosis.  5. The aortic valve is tricuspid. There is mild calcification of the aortic valve. Aortic valve regurgitation is not visualized. Aortic valve sclerosis is present, with no evidence of aortic valve stenosis.  6. The inferior vena cava is normal in size with greater than 50% respiratory variability, suggesting right atrial pressure of 3 mmHg. Comparison(s): No significant change from prior study. FINDINGS  Left Ventricle: Left ventricular ejection fraction, by estimation, is 45 to 50%. The left ventricle has mildly decreased function. The left ventricle demonstrates regional wall motion abnormalities. The left ventricular internal cavity size was normal in size. There is moderate left ventricular hypertrophy. Left ventricular diastolic parameters are consistent with Grade I diastolic dysfunction (impaired relaxation). Elevated left ventricular end-diastolic pressure. Right Ventricle: The right ventricular size is normal. No increase in right ventricular wall thickness. Right ventricular systolic function is normal. Tricuspid regurgitation signal is inadequate for assessing PA pressure. Left Atrium: Left atrial size was mild to moderately dilated. Right Atrium: Right atrial size was normal in size. Pericardium: There is no evidence of pericardial effusion. Mitral Valve: The mitral valve is normal in structure. No evidence of mitral valve regurgitation. No evidence of mitral valve stenosis. Tricuspid Valve: The tricuspid valve is normal in structure. Tricuspid valve regurgitation is not demonstrated. No evidence of tricuspid stenosis. Aortic Valve: The aortic valve is tricuspid. There is mild  calcification of the aortic valve. Aortic valve regurgitation is not visualized. Aortic valve sclerosis is present, with no evidence of aortic valve stenosis. Pulmonic Valve: The pulmonic valve was not well visualized. Pulmonic valve regurgitation is not visualized. No evidence of pulmonic stenosis. Aorta: The aortic root, ascending aorta and aortic arch are all structurally normal, with no evidence  of dilitation or obstruction. Venous: The inferior vena cava is normal in size with greater than 50% respiratory variability, suggesting right atrial pressure of 3 mmHg. IAS/Shunts: The atrial septum is grossly normal.  LEFT VENTRICLE PLAX 2D LVIDd:         4.90 cm      Diastology LVIDs:         3.70 cm      LV e' medial:    3.00 cm/s LV PW:         1.92 cm      LV E/e' medial:  24.2 LV IVS:        1.83 cm      LV e' lateral:   3.43 cm/s LVOT diam:     2.20 cm      LV E/e' lateral: 21.2 LV SV:         69 LV SV Index:   35 LVOT Area:     3.80 cm  LV Volumes (MOD) LV vol d, MOD A2C: 121.0 ml LV vol d, MOD A4C: 160.0 ml LV vol s, MOD A2C: 63.6 ml LV vol s, MOD A4C: 94.3 ml LV SV MOD A2C:     57.4 ml LV SV MOD A4C:     160.0 ml LV SV MOD BP:      62.4 ml RIGHT VENTRICLE RV S prime:     13.90 cm/s TAPSE (M-mode): 2.5 cm LEFT ATRIUM             Index        RIGHT ATRIUM           Index LA diam:        4.30 cm 2.17 cm/m   RA Area:     15.30 cm LA Vol (A2C):   75.3 ml 37.91 ml/m  RA Volume:   34.90 ml  17.57 ml/m LA Vol (A4C):   56.2 ml 28.30 ml/m LA Biplane Vol: 66.8 ml 33.63 ml/m  AORTIC VALVE LVOT Vmax:   110.00 cm/s LVOT Vmean:  70.200 cm/s LVOT VTI:    0.182 m  AORTA Ao Root diam: 3.40 cm Ao Asc diam:  3.30 cm MITRAL VALVE MV Area (PHT): 6.17 cm     SHUNTS MV Decel Time: 123 msec     Systemic VTI:  0.18 m MV E velocity: 72.70 cm/s   Systemic Diam: 2.20 cm MV A velocity: 137.00 cm/s MV E/A ratio:  0.53 Buford Dresser MD Electronically signed by Buford Dresser MD Signature Date/Time: 09/01/2022/5:09:38  PM    Final    DG Chest Port 1 View  Result Date: 08/31/2022 CLINICAL DATA:  Shortness of breath. EXAM: PORTABLE CHEST 1 VIEW COMPARISON:  Radiograph 08/14/2019 FINDINGS: Right-sided dialysis catheter in place. Stable cardiomegaly. Vascular congestion with possible central perihilar edema. No large pleural effusion on this AP view. No confluent airspace disease. No pneumothorax. Skin fold projects over the left hemithorax. IMPRESSION: Cardiomegaly with vascular congestion and possible central perihilar edema. Electronically Signed   By: Keith Rake M.D.   On: 08/31/2022 23:35   Medications:  sodium chloride     anticoagulant sodium citrate      calcium acetate  2,001 mg Oral TID WC   carvedilol  12.5 mg Oral BID WC   Chlorhexidine Gluconate Cloth  6 each Topical Q0600   cloNIDine  0.1 mg Oral TID   clopidogrel  75 mg Oral Daily   darbepoetin (ARANESP) injection - DIALYSIS  100 mcg Subcutaneous Q Mon-1800   docusate  sodium  100 mg Oral Daily   dolutegravir-lamiVUDine  1 tablet Oral Daily   doxepin  75 mg Oral QHS   feeding supplement (NEPRO CARB STEADY)  237 mL Oral TID BM   heparin  5,000 Units Subcutaneous Q8H   hydrALAZINE  10 mg Oral Q8H   insulin aspart  0-6 Units Subcutaneous Q4H   insulin glargine-yfgn  5 Units Subcutaneous QHS   levETIRAcetam  500 mg Oral BID   multivitamin  1 tablet Oral QHS   pantoprazole  40 mg Oral Daily   rosuvastatin  5 mg Oral Daily   sodium chloride flush  3 mL Intravenous Q12H   sulfamethoxazole-trimethoprim  1 tablet Oral Q M,W,F   tamsulosin  0.4 mg Oral Daily    Dialysis Orders: [facility] 4hr 400/800 2/2 bath UFP4 Hectorol 19mcg qtx Heparin 1600 bolus Mircera 200 q2weeks (given 12/7)    Assessment/Plan: 1 Acute hypoxic RF: severely underdialyzed and volume overload with EDW listed at 70kg but with all the missed and truncated treatments difficult to determine true EDW.     Exposure for COVID as well with roommate recently testing  positive but patient is asymptomatic with no cough, fevers, sore throat, myalgias.   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 on max ESA as OP- last given 54/9. 5. Metabolic Bone Disease:  continue VDRA and binders 6.  HIV: meds per primary 7.  H/o stroke: R sided weakness and h/o slurred speech 8.  Dispo: pending  Anice Paganini, PA-C 09/02/2022, 11:24 AM  [facility] Pager: [phone]

## 2022-09-02 NOTE — Progress Notes (Signed)
Patient signed out AMA the last hour of treatment. Patient kept statting " I dont know why I'm here, I came yesterday". AMA form was signed out, provider Candiss Norse notified.

## 2022-09-02 NOTE — Progress Notes (Signed)
Received patient in bed to unit.  Alert and oriented.  Informed consent signed and in chart.   Treatment initiated: [place] Treatment completed: 1738  Patient tolerated well.  Transported back to the room  Alert, without acute distress.  Hand-off given to patient's nurse.   Access used: catheter Access issues: none  Total UF removed: 1.7 L Medication(s) given: none Post HD VS: 110/61 P 86 R 16. O2 sat 100% [place] 2 L on. Post HD weight: 75 Kg   [facility]

## 2022-09-02 NOTE — Progress Notes (Signed)
Patient states he never take insulin. He does not want to. Educated Pt. Dr. On call notified.

## 2022-09-02 NOTE — Care Management Obs Status (Signed)
[place] NOTIFICATION   Patient Details  Name: Richard [name] MRN: [id number] Date of Birth: 07/22/1956   Medicare Observation Status Notification Given:  Yes    Tom-Johnson, Renea Ee, RN 09/02/2022, 4:56 PM

## 2022-09-02 NOTE — Progress Notes (Signed)
   09/01/22 2141  Vitals  Temp 98.7 F (37.1 C)  Temp Source Oral  BP (!) 183/71  MAP (mmHg) 95  BP Method Automatic  Pulse Rate 87  Pulse Rate Source Monitor  Resp 18  MEWS COLOR  MEWS Score Color Green  Oxygen Therapy  SpO2 99 %  Pain Assessment  Pain Scale 0-10  Pain Score 0  MEWS Score  MEWS Temp 0  MEWS Systolic 0  MEWS Pulse 0  MEWS RR 0  MEWS LOC 0  MEWS Score 0  Provider Notification  Date Provider Notified 09/01/22  Time Provider Notified 2200  Notification Reason Red med refusal ([place])  Note  Observations Pt has been hypoglycemic

## 2022-09-02 NOTE — Consult Note (Addendum)
Cardiology Consultation   Patient ID: Richard [name] MRN: [id number]; DOB: [birth year]  Admit date: 08/31/2022 Date of Consult: 09/02/2022  PCP:  Jodi Marble, MD   [place] Providers Cardiologist:  New to [facility] - Dr. Marlou Porch     Patient Profile:   Richard [name] is a 66 y.o. male with a hx of HTN, DM II, GERD, ESRD on HD TThSat, HIV, CVA, mild HFrEF, noncompliance, chronically elevated troponin and h/o seizure who is being seen 09/02/2022 for the evaluation of abnormal echocardiogram and elevated troponin at the request of Dr. Dwyane Dee.  History of Present Illness:   Mr. 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  Previous echocardiogram obtained in May 2022 showed EF 50 to 55%, grade 2 DD, moderate LVH, severe LAE, trivial MR.  According to the previous summary, patient lives at a skilled nursing facility and uses a wheelchair for mobility.  He was admitted in January 2023 with syncope.  On arrival, his blood sugar was over 200.  Echocardiogram obtained in January 2023 showed EF 45 to 50%, moderate LVH, grade 1 DD, normal RV, inferior wall akinesis.  Cardiology service was consulted at the time for evaluation with abnormal echocardiogram.  Patient was completely asymptomatic and denied any major chest discomfort.  Medical management was recommended.  Echocardiogram was personally reviewed by Dr. Marlou Porch who felt the abnormal finding was also seen on the previous echocardiogram as well.  Since then, patient has been seen in the ED 5 different times and was last admitted in November 2023.  Major contributor to his recurrent ED visit and hospitalization unfortunately has been noncompliant with dialysis, when he arrived he usually is volume overloaded, short of breath and also was here hypertensive emergency.  This is the case during  the most recent hospitalization in November 2023 when he arrived with blood pressure of 210/96 and was in pulmonary edema due to noncompliance with dialysis.  He required emergent dialysis session.  Patient returned back to the hospital during this admission on 08/31/2022 after missing dialysis session the day before.  O2 saturation was 89% on room air on arrival and was placed on CPAP therapy.  Blood pressure was 250/130.  Patient was placed on nitroglycerin drip.  He says he has been compliant with his blood pressure medication including clonidine, hydralazine and carvedilol.  He says he missed his dialysis session because it was his birthday.  On arrival, creatinine 11.4.  BNP 2046.  Serial troponin 88-->223-->544.  White blood cell count 15.2.  Hemoglobin 10.9.  Viral panel negative.  Chest x-ray showed cardiomegaly with vascular congestion and possible central perihilar edema.  EKG showed sinus tachycardia with LVH and early repol.  Patient underwent emergent dialysis.  Echocardiogram obtained on 09/01/2022 showed EF 45 to 50%, grade 1 DD, hypokinesis of the LV and the entire inferior wall, normal RVEF, no significant valve issue.  Talking with the patient, he denies any recent chest pain.  Nitro drip has been stopped and the patient resumed on home blood pressure medication.   Past Medical History:  Diagnosis Date   COVID-19 05/16/2020   Diabetes mellitus, type II, insulin dependent ([place])    ESRD on hemodialysis ([place]) 08/30/2021   GERD (gastroesophageal reflux disease)    Hematoma 03/19/2022   HIV (human immunodeficiency virus infection) ([place])    Hypertension    Meningoencephalitis  Neuropathy in diabetes ([place])    bilat feet   Recurrent falls    possible small SAH right frontal lobe 12/19/21 (Plavix held x 1 week); left transverse foramen C3 fracture 11/04/21   Ruptured lumbar disc    Seizure disorder ([place])    Stroke ([place]) 2013   residual right sided weakness (arm>leg)    Past Surgical  History:  Procedure Laterality Date   AV FISTULA PLACEMENT Left 04/25/2020   Procedure: LEFT ARM BRACHIOCEPHALIC ARTERIOVENOUS (AV) FISTULA CREATION;  Surgeon: Rosetta Posner, MD;  Location: [facility] OR;  Service: Vascular;  Laterality: Left;   AV FISTULA PLACEMENT Left 02/28/2022   Procedure: LEFT ARM INSERTION OF ARTERIOVENOUS (AV) GORE-TEX GRAFT ARM;  Surgeon: Marty Heck, MD;  Location: [facility];  Service: Vascular;  Laterality: Left;   IR FLUORO GUIDE CV LINE RIGHT  04/13/2020   IR FLUORO GUIDE CV LINE RIGHT  01/31/2022   IR REMOVAL TUN CV CATH W/O FL  01/14/2021   IR US GUIDE VASC ACCESS RIGHT  04/13/2020   IR US GUIDE VASC ACCESS RIGHT  01/31/2022   IR US GUIDE VASC ACCESS RIGHT  01/31/2022   KNEE ARTHROSCOPY Bilateral      Home Medications:  Prior to Admission medications   Medication Sig Start Date End Date Taking? Authorizing Provider  acetaminophen (TYLENOL) 325 MG tablet Take 650 mg by mouth every 6 (six) hours as needed for moderate pain, fever or mild pain.   Yes [provider]  b complex-vitamin c-folic acid (NEPHRO-VITE) 0.8 MG TABS tablet Take 1 tablet by mouth daily.   Yes [provider]  calcium acetate (PHOSLO) 667 MG capsule Take 2,001 mg by mouth 3 (three) times daily with meals.   Yes [provider]  carvedilol (COREG) 12.5 MG tablet Take 12.5 mg by mouth 2 (two) times daily.   Yes [provider]  cloNIDine (CATAPRES) 0.1 MG tablet Take 1 tablet (0.1 mg total) by mouth 3 (three) times daily. 02/21/21  Yes Hosie Poisson, MD  clopidogrel (PLAVIX) 75 MG tablet Take 1 tablet (75 mg total) by mouth daily. 03/07/20  Yes Koberlein, Junell C, MD  docusate sodium (COLACE) 100 MG capsule Take 100 mg by mouth daily.   Yes [provider]  dolutegravir-lamiVUDine (DOVATO) 50-300 MG tablet Take 1 tablet by mouth daily. 03/19/22  Yes Tommy Medal, Lavell Islam, MD  doxepin (SINEQUAN) 75 MG capsule Take 75 mg by mouth at bedtime.   Yes [provider]  Ergocalciferol 50 MCG (2000 UT) TABS Take 2,000 Units by mouth daily.   Yes [provider]  hydrALAZINE (APRESOLINE) 10 MG tablet Take 10 mg by mouth 3 (three) times daily.   Yes [provider]  hydrocortisone cream 1 % Apply 1 Application topically every 6 (six) hours as needed for itching. Apply to affected areas on back arms   Yes [provider]  insulin glargine-yfgn (SEMGLEE) 100 UNIT/ML Pen Inject 5 Units into the skin at bedtime.   Yes [provider]  levETIRAcetam (KEPPRA) 500 MG tablet Take 500 mg by mouth 2 (two) times daily.   Yes [provider]  Lidocaine 3 % CREA Apply 1 application topically See admin instructions. Apply 1 times a day on Tuesday, Thursday and Saturday prior to dialysis   Yes [provider]  Nutritional Supplements (FEEDING SUPPLEMENT, NEPRO CARB STEADY,) LIQD Take 237 mLs by mouth 3 (three) times daily between meals.   Yes [provider]  ondansetron [facility])  4 MG tablet Take 4 mg by mouth every 8 (eight) hours as needed for vomiting or nausea. 12/09/21  Yes [provider]  pantoprazole (PROTONIX) 40 MG tablet TAKE 1 TABLET BY MOUTH EVERY DAY Patient taking differently: Take 40 mg by mouth daily. 01/23/20  Yes Koberlein, Junell C, MD  rosuvastatin (CRESTOR) 5 MG tablet Take 5 mg by mouth daily. 12/03/21  Yes [provider]  sulfamethoxazole-trimethoprim (BACTRIM DS) 800-160 MG tablet Take 1 tablet by mouth 3 (three) times a week. Patient taking differently: Take 1 tablet by mouth every Monday, Wednesday, and Friday. Once daily on M-W F continuous 04/23/20  Yes Little Ishikawa, MD  tamsulosin (FLOMAX) 0.4 MG CAPS capsule TAKE 1 CAPSULE BY MOUTH EVERY DAY Patient taking differently: Take 0.4 mg by mouth daily. 03/28/20  Yes Koberlein, [name], MD  traMADol (ULTRAM) 50 MG tablet Take 50 mg by mouth every 8 (eight) hours as needed (for pain in right hip area).   Yes  [provider]  triamcinolone cream (KENALOG) 0.1 % Apply 1 Application topically in the morning and at bedtime. Apply to back and arms for rash twice a day 08/07/22  Yes [provider]  [place] test strip USE 1 strip TO test blood sugar 2 TIMES DAILY TO 3 TIMES DAILY 06/10/19   [name], MD  blood glucose meter kit and supplies KIT Dispense based on patient and insurance preference. Check blood sugar 4 times a day 06/11/18   [name], MD  epoetin alfa-epbx (RETACRIT) 41324 UNIT/ML injection Inject 20,000 Units into the skin every Monday.    [provider]  Insulin Pen Needle (SURE COMFORT PEN NEEDLES) 31G X 8 MM MISC Use as directed twice daily with Novolog flex pen 02/10/19   [name], MD  Misc. Devices (COMMODE 3-IN-1) MISC Use as directed 02/24/20   [name], MD  Misc. Devices (TRANSFER BENCH) MISC Use as directed 02/24/20   [name], MD  TRUEPLUS INSULIN SYRINGE 31G X 5/16" 1 ML MISC Use pen needle with insulin 2 times daily 03/31/19   [name], MD    Inpatient Medications: Scheduled Meds:  calcium acetate  2,001 mg Oral TID WC   carvedilol  12.5 mg Oral BID WC   Chlorhexidine Gluconate Cloth  6 each Topical Q0600   cloNIDine  0.1 mg Oral TID   clopidogrel  75 mg Oral Daily   darbepoetin (ARANESP) injection - DIALYSIS  100 mcg Subcutaneous Q Mon-1800   docusate sodium  100 mg Oral Daily   dolutegravir-lamiVUDine  1 tablet Oral Daily   doxepin  75 mg Oral QHS   feeding supplement (NEPRO CARB STEADY)  237 mL Oral TID BM   heparin  5,000 Units Subcutaneous Q8H   hydrALAZINE  10 mg Oral Q8H   insulin aspart  0-6 Units Subcutaneous Q4H   insulin glargine-yfgn  5 Units Subcutaneous QHS   levETIRAcetam  500 mg Oral BID   multivitamin  1 tablet Oral QHS   pantoprazole  40 mg Oral Daily   rosuvastatin  5 mg Oral Daily   sodium chloride flush  3 mL Intravenous Q12H    sulfamethoxazole-trimethoprim  1 tablet Oral Q M,W,F   tamsulosin  0.4 mg Oral Daily   Continuous Infusions:  sodium chloride     anticoagulant sodium citrate     PRN Meds: sodium chloride, acetaminophen **OR** acetaminophen, alteplase, anticoagulant sodium citrate, heparin, [START ON 09/03/2022] heparin, hydrALAZINE,  lidocaine (PF), lidocaine-prilocaine, ondansetron (ZOFRAN) IV, pentafluoroprop-tetrafluoroeth, sodium chloride flush  Allergies:    Allergies  Allergen Reactions   Oxycodone Nausea And Vomiting    NOT DOCUMENTED ON MAR    Social History:   Social History   Socioeconomic History   Marital status: Single    Spouse name: Not on file   Number of children: 2   Years of education: 12   Highest education level: 12th grade  Occupational History   Occupation: disabled  Tobacco Use   Smoking status: Every Day    Packs/day: 0.50    Years: 35.00    Total pack years: 17.50    Types: Cigarettes    Passive exposure: Current   Smokeless tobacco: Never   Tobacco comments:    Currently smoking about 0.5ppd  Vaping Use   Vaping Use: Never used  Substance and Sexual Activity   Alcohol use: No   Drug use: Not Currently    Types: Marijuana   Sexual activity: Not Currently    Comment: declined condoms  Other Topics Concern   Not on file  Social History Narrative   Not on file   Social Determinants of Health   Financial Resource Strain: Low Risk  (07/17/2021)   Overall Financial Resource Strain (CARDIA)    Difficulty of Paying Living Expenses: Not very hard  Food Insecurity: No Food Insecurity (09/01/2022)   Hunger Vital Sign    Worried About Running Out of Food in the Last Year: Never true    Ran Out of Food in the Last Year: Never true  Transportation Needs: No Transportation Needs (09/01/2022)   PRAPARE - Hydrologist (Medical): No    Lack of Transportation (Non-Medical): No  Physical Activity: Inactive (07/17/2021)   Exercise  Vital Sign    Days of Exercise per Week: 0 days    Minutes of Exercise per Session: 0 min  Stress: Stress Concern Present (07/17/2021)   [place]    Feeling of Stress : Very much  Social Connections: Socially Isolated (07/17/2021)   Social Connection and Isolation Panel [NHANES]    Frequency of Communication with Friends and Family: More than three times a week    Frequency of Social Gatherings with Friends and Family: More than three times a week    Attends Religious Services: Never    Marine scientist or Organizations: No    Attends Archivist Meetings: Never    Marital Status: Divorced  Human resources officer Violence: Not At Risk (09/01/2022)   Humiliation, Afraid, Rape, and Kick questionnaire    Fear of Current or Ex-Partner: No    Emotionally Abused: No    Physically Abused: No    Sexually Abused: No    Family History:    Family History  Problem Relation Age of Onset   Other Mother    Stroke Father    Hypertension Father    Epilepsy Father    Breast cancer Sister    Stroke Brother    Heart attack Brother    Diabetes Sister      ROS:  Please see the history of present illness.   All other ROS reviewed and negative.     Physical Exam/Data:   Vitals:   09/01/22 1651 09/01/22 2141 09/02/22 0415 09/02/22 0851  BP: (!) 191/67 (!) 183/71 134/66 (!) 146/73  Pulse: 92 87 84 88  Resp: _0 Temp: 98.7 F (  37.1 C) 98.7 F (37.1 C) 97.6 F (36.4 C) 98 F (36.7 C)  TempSrc: Oral Oral  Oral  SpO2: 100% 99% 100% 100%  Weight:      Height:        Intake/Output Summary (Last 24 hours) at 09/02/2022 1531 Last data filed at 09/02/2022 1320 Gross per 24 hour  Intake 960 ml  Output 500 ml  Net 460 ml      09/01/2022    4:17 AM 08/14/2022    5:00 AM  Last 3 Weights  Weight (lbs) 169 lb 8.5 oz 169 lb 8.5 oz  Weight (kg) 76.9 kg 76.9 kg     Body mass index is 22.99 kg/m.   General:  Well nourished, well developed, in no acute distress HEENT: normal Neck: no JVD Vascular: No carotid bruits; Distal pulses 2+ bilaterally Cardiac:  normal S1, S2; RRR; no murmur  Lungs:  clear to auscultation bilaterally, no wheezing, rhonchi or rales  Abd: soft, nontender, no hepatomegaly  Ext: no edema Musculoskeletal:  No deformities, BUE and BLE strength normal and equal Skin: warm and dry  Neuro:  CNs 2-12 intact, no focal abnormalities noted Psych:  Normal affect   EKG:  The EKG was personally reviewed and demonstrates: Sinus tachycardia, LVH with early repol Telemetry:  Telemetry was personally reviewed and demonstrates: Normal sinus rhythm, no significant ST-T wave changes  Relevant CV Studies:  Echo 09/01/2022  1. Left ventricular ejection fraction, by estimation, is 45 to 50%. The  left ventricle has mildly decreased function. The left ventricle  demonstrates regional wall motion abnormalities (see scoring  diagram/findings for description). There is moderate  left ventricular hypertrophy. Left ventricular diastolic parameters are  consistent with Grade I diastolic dysfunction (impaired relaxation).  Elevated left ventricular end-diastolic pressure. There is hypokinesis of  the left ventricular, entire inferior  wall.   2. Right ventricular systolic function is normal. The right ventricular  size is normal. Tricuspid regurgitation signal is inadequate for assessing  PA pressure.   3. Left atrial size was mild to moderately dilated.   4. The mitral valve is normal in structure. No evidence of mitral valve  regurgitation. No evidence of mitral stenosis.   5. The aortic valve is tricuspid. There is mild calcification of the  aortic valve. Aortic valve regurgitation is not visualized. Aortic valve  sclerosis is present, with no evidence of aortic valve stenosis.   6. The inferior vena cava is normal in size with greater than 50%  respiratory variability,  suggesting right atrial pressure of 3 mmHg.   Comparison(s): No significant change from prior study.   Laboratory Data:  High Sensitivity Troponin:   Recent Labs  Lab 08/13/22 1452 08/31/22 2342 09/01/22 0151 09/01/22 0415 09/02/22 0535  TROPONINIHS 647* 88* 223* 544* 547*     Chemistry Recent Labs  Lab 08/31/22 2352 08/31/22 2353 09/01/22 0151 09/02/22 0535  NA 138 139 142 139  K 5.8* 5.9* 4.8 3.7  CL 110  --  104 98  CO2  --   --  22 27  GLUCOSE 133*  --  110* 86  BUN 64*  --  44* 19  CREATININE 11.40*  --  10.86* 6.34*  CALCIUM  --   --  7.5* 8.1*  GFRNONAA  --   --  5* 9*  ANIONGAP  --   --  16* 14    Recent Labs  Lab 09/01/22 0151  PROT 6.6  ALBUMIN 2.6*  AST 17  ALT  12  ALKPHOS 89  BILITOT 0.5   Lipids No results for input(s): "CHOL", "TRIG", "HDL", "LABVLDL", "LDLCALC", "CHOLHDL" in the last 168 hours.  Hematology Recent Labs  Lab 08/31/22 2342 08/31/22 2352 08/31/22 2353 09/01/22 0834 09/02/22 0535  WBC 15.2*  --   --  7.8 4.7  RBC 3.30*  --   --  2.53* 2.99*  HGB 10.9*   < > 11.6* 8.4* 9.5*  HCT 33.3*   < > 34.0* 25.2* 30.1*  MCV 100.9*  --   --  99.6 100.7*  MCH 33.0  --   --  33.2 31.8  MCHC 32.7  --   --  33.3 31.6  RDW 16.6*  --   --  16.0* 16.0*  PLT PLATELET CLUMPS NOTED ON SMEAR, COUNT APPEARS ADEQUATE  --   --  189 173   < > = values in this interval not displayed.   Thyroid No results for input(s): "TSH", "FREET4" in the last 168 hours.  BNP Recent Labs  Lab 08/31/22 2342  BNP 2,046.1*    DDimer No results for input(s): "DDIMER" in the last 168 hours.   Radiology/Studies:  ECHOCARDIOGRAM COMPLETE  Result Date: 09/01/2022    ECHOCARDIOGRAM REPORT   Patient Name:   DARTANIAN KNAGGS Date of Exam: 09/01/2022 Medical Rec #:  [id number]             Height:       72.0 in Accession #:    [id number]            Weight:       169.5 lb Date of Birth:  March 01, 1956             BSA:          1.986 m Patient Age:    11 years               BP:           142/118 mmHg Patient Gender: M                     HR:           96 bpm. Exam Location:  Inpatient Procedure: 2D Echo, Cardiac Doppler and Color Doppler Indications:    Elevated Troponin  History:        Patient has prior history of Echocardiogram examinations, most                 recent 10/20/2021. Stroke; Risk Factors:Hypertension, Diabetes                 and GERD.  Sonographer:    Bernadene Person RDCS Referring Phys: [id number] [place]  1. Left ventricular ejection fraction, by estimation, is 45 to 50%. The left ventricle has mildly decreased function. The left ventricle demonstrates regional wall motion abnormalities (see scoring diagram/findings for description). There is moderate left ventricular hypertrophy. Left ventricular diastolic parameters are consistent with Grade I diastolic dysfunction (impaired relaxation). Elevated left ventricular end-diastolic pressure. There is hypokinesis of the left ventricular, entire inferior wall.  2. Right ventricular systolic function is normal. The right ventricular size is normal. Tricuspid regurgitation signal is inadequate for assessing PA pressure.  3. Left atrial size was mild to moderately dilated.  4. The mitral valve is normal in structure. No evidence of mitral valve regurgitation. No evidence of mitral stenosis.  5. The aortic valve is tricuspid. There is mild calcification of the aortic valve. Aortic valve regurgitation  is not visualized. Aortic valve sclerosis is present, with no evidence of aortic valve stenosis.  6. The inferior vena cava is normal in size with greater than 50% respiratory variability, suggesting right atrial pressure of 3 mmHg. Comparison(s): No significant change from prior study. FINDINGS  Left Ventricle: Left ventricular ejection fraction, by estimation, is 45 to 50%. The left ventricle has mildly decreased function. The left ventricle demonstrates regional wall motion abnormalities. The left ventricular  internal cavity size was normal in size. There is moderate left ventricular hypertrophy. Left ventricular diastolic parameters are consistent with Grade I diastolic dysfunction (impaired relaxation). Elevated left ventricular end-diastolic pressure. Right Ventricle: The right ventricular size is normal. No increase in right ventricular wall thickness. Right ventricular systolic function is normal. Tricuspid regurgitation signal is inadequate for assessing PA pressure. Left Atrium: Left atrial size was mild to moderately dilated. Right Atrium: Right atrial size was normal in size. Pericardium: There is no evidence of pericardial effusion. Mitral Valve: The mitral valve is normal in structure. No evidence of mitral valve regurgitation. No evidence of mitral valve stenosis. Tricuspid Valve: The tricuspid valve is normal in structure. Tricuspid valve regurgitation is not demonstrated. No evidence of tricuspid stenosis. Aortic Valve: The aortic valve is tricuspid. There is mild calcification of the aortic valve. Aortic valve regurgitation is not visualized. Aortic valve sclerosis is present, with no evidence of aortic valve stenosis. Pulmonic Valve: The pulmonic valve was not well visualized. Pulmonic valve regurgitation is not visualized. No evidence of pulmonic stenosis. Aorta: The aortic root, ascending aorta and aortic arch are all structurally normal, with no evidence of dilitation or obstruction. Venous: The inferior vena cava is normal in size with greater than 50% respiratory variability, suggesting right atrial pressure of 3 mmHg. IAS/Shunts: The atrial septum is grossly normal.  LEFT VENTRICLE PLAX 2D LVIDd:         4.90 cm      Diastology LVIDs:         3.70 cm      LV e' medial:    3.00 cm/s LV PW:         1.92 cm      LV E/e' medial:  24.2 LV IVS:        1.83 cm      LV e' lateral:   3.43 cm/s LVOT diam:     2.20 cm      LV E/e' lateral: 21.2 LV SV:         69 LV SV Index:   35 LVOT Area:     3.80 cm  LV  Volumes (MOD) LV vol d, MOD A2C: 121.0 ml LV vol d, MOD A4C: 160.0 ml LV vol s, MOD A2C: 63.6 ml LV vol s, MOD A4C: 94.3 ml LV SV MOD A2C:     57.4 ml LV SV MOD A4C:     160.0 ml LV SV MOD BP:      62.4 ml RIGHT VENTRICLE RV S prime:     13.90 cm/s TAPSE (M-mode): 2.5 cm LEFT ATRIUM             Index        RIGHT ATRIUM           Index LA diam:        4.30 cm 2.17 cm/m   RA Area:     15.30 cm LA Vol (A2C):   75.3 ml 37.91 ml/m  RA Volume:   34.90 ml  17.57 ml/m LA Vol (  A4C):   56.2 ml 28.30 ml/m LA Biplane Vol: 66.8 ml 33.63 ml/m  AORTIC VALVE LVOT Vmax:   110.00 cm/s LVOT Vmean:  70.200 cm/s LVOT VTI:    0.182 m  AORTA Ao Root diam: 3.40 cm Ao Asc diam:  3.30 cm MITRAL VALVE MV Area (PHT): 6.17 cm     SHUNTS MV Decel Time: 123 msec     Systemic VTI:  0.18 m MV E velocity: 72.70 cm/s   Systemic Diam: 2.20 cm MV A velocity: 137.00 cm/s MV E/A ratio:  0.53 Buford Dresser MD Electronically signed by Buford Dresser MD Signature Date/Time: 09/01/2022/5:09:38 PM    Final    DG Chest Port 1 View  Result Date: 08/31/2022 CLINICAL DATA:  Shortness of breath. EXAM: PORTABLE CHEST 1 VIEW COMPARISON:  Radiograph 08/14/2019 FINDINGS: Right-sided dialysis catheter in place. Stable cardiomegaly. Vascular congestion with possible central perihilar edema. No large pleural effusion on this AP view. No confluent airspace disease. No pneumothorax. Skin fold projects over the left hemithorax. IMPRESSION: Cardiomegaly with vascular congestion and possible central perihilar edema. Electronically Signed   By: Keith Rake M.D.   On: 08/31/2022 23:35     Assessment and Plan:   Abnormal echo   -Patient has a history of abnormal echocardiogram with EF 45 to 50% in January 2023 with inferior wall motion abnormality.  He was seen by Dr. Marlou Porch who did not recommend any ischemic workup.  -Patient continued to deny any chest pain.  I suspect the elevation of the troponin is related to hypertensive emergency  and pulmonary edema secondary to volume overload.  Given his prior history of noncompliance, he would not be a invasive study candidate.  Elevated troponin: 88-->223-->544.  Patient denies any chest pain.  Looking back, he has chronically elevated troponin.  Troponin elevation was in the setting of hypertensive emergency and pulmonary edema secondary to volume overload and missed dialysis.  Given lack of chest pain and history of noncompliance, would not recommend any further workup.  Noncompliance: This is an recurrent issue.  Patient has been admitted to the hospital and seen multiple times in the emergency room due to volume overload when he misses his dialysis sessions.  Acute respiratory failure secondary to missed dialysis and volume overload: Managed by nephrology service.  Hypertensive emergency: Although patient says he has been compliant with his home blood pressure medication, it is difficult to tell if he is truly compliant.  Every time he coming to the hospital, his systolic blood pressure is usually > 200 however that is in the setting of volume overload and missed dialysis session.  He is on hydralazine, clonidine and carvedilol at home.  Clonidine would not be a very good choice in this patient who has compliance issue due to rebound hypertension.  Recommend consolidate his medications by increasing carvedilol to 25 mg twice a day and also increase hydralazine to 25 mg 3 times daily and d/c clonidine.   ESRD on HD: Missed dialysis on Saturday.  Patient underwent urgent dialysis session yesterday, still short of breath today, nephrology service recommended proceeding with dialysis again today, patient wished to think about it.  DM2  HIV  History of CVA   Risk Assessment/Risk Scores:     TIMI Risk Score for Unstable Angina or Non-ST Elevation MI:   The patient's TIMI risk score is 2, which indicates a 8% risk of all cause mortality, new or recurrent myocardial infarction or need  for urgent revascularization in the next 14 days.  New York Heart Association (NYHA) Functional Class NYHA Class IV        For questions or updates, please contact [place] Please consult [email or web address] for contact info under    Hilbert Corrigan, [place]  09/02/2022 3:31 PM

## 2022-09-03 DIAGNOSIS — E559 Vitamin D deficiency, unspecified: Secondary | ICD-10-CM | POA: Diagnosis not present

## 2022-09-03 DIAGNOSIS — I10 Essential (primary) hypertension: Secondary | ICD-10-CM | POA: Diagnosis not present

## 2022-09-03 DIAGNOSIS — E1122 Type 2 diabetes mellitus with diabetic chronic kidney disease: Secondary | ICD-10-CM | POA: Diagnosis present

## 2022-09-03 DIAGNOSIS — N19 Unspecified kidney failure: Secondary | ICD-10-CM | POA: Diagnosis present

## 2022-09-03 DIAGNOSIS — D631 Anemia in chronic kidney disease: Secondary | ICD-10-CM | POA: Diagnosis present

## 2022-09-03 DIAGNOSIS — E1140 Type 2 diabetes mellitus with diabetic neuropathy, unspecified: Secondary | ICD-10-CM | POA: Diagnosis present

## 2022-09-03 DIAGNOSIS — Z794 Long term (current) use of insulin: Secondary | ICD-10-CM | POA: Diagnosis not present

## 2022-09-03 DIAGNOSIS — R0603 Acute respiratory distress: Secondary | ICD-10-CM | POA: Diagnosis present

## 2022-09-03 DIAGNOSIS — I69351 Hemiplegia and hemiparesis following cerebral infarction affecting right dominant side: Secondary | ICD-10-CM | POA: Diagnosis not present

## 2022-09-03 DIAGNOSIS — Z992 Dependence on renal dialysis: Secondary | ICD-10-CM | POA: Diagnosis not present

## 2022-09-03 DIAGNOSIS — Z1152 Encounter for screening for COVID-19: Secondary | ICD-10-CM | POA: Diagnosis not present

## 2022-09-03 DIAGNOSIS — I161 Hypertensive emergency: Secondary | ICD-10-CM | POA: Diagnosis not present

## 2022-09-03 DIAGNOSIS — N186 End stage renal disease: Secondary | ICD-10-CM | POA: Diagnosis present

## 2022-09-03 DIAGNOSIS — E785 Hyperlipidemia, unspecified: Secondary | ICD-10-CM

## 2022-09-03 DIAGNOSIS — E119 Type 2 diabetes mellitus without complications: Secondary | ICD-10-CM | POA: Diagnosis not present

## 2022-09-03 DIAGNOSIS — F17210 Nicotine dependence, cigarettes, uncomplicated: Secondary | ICD-10-CM | POA: Diagnosis present

## 2022-09-03 DIAGNOSIS — I5022 Chronic systolic (congestive) heart failure: Secondary | ICD-10-CM | POA: Diagnosis present

## 2022-09-03 DIAGNOSIS — E1169 Type 2 diabetes mellitus with other specified complication: Secondary | ICD-10-CM | POA: Diagnosis not present

## 2022-09-03 DIAGNOSIS — Z8616 Personal history of COVID-19: Secondary | ICD-10-CM | POA: Diagnosis not present

## 2022-09-03 DIAGNOSIS — B20 Human immunodeficiency virus [HIV] disease: Secondary | ICD-10-CM | POA: Diagnosis present

## 2022-09-03 DIAGNOSIS — E8779 Other fluid overload: Secondary | ICD-10-CM | POA: Diagnosis present

## 2022-09-03 DIAGNOSIS — I2489 Other forms of acute ischemic heart disease: Secondary | ICD-10-CM | POA: Diagnosis present

## 2022-09-03 DIAGNOSIS — J9601 Acute respiratory failure with hypoxia: Secondary | ICD-10-CM | POA: Diagnosis not present

## 2022-09-03 DIAGNOSIS — G40909 Epilepsy, unspecified, not intractable, without status epilepticus: Secondary | ICD-10-CM

## 2022-09-03 DIAGNOSIS — Z20822 Contact with and (suspected) exposure to covid-19: Secondary | ICD-10-CM | POA: Diagnosis present

## 2022-09-03 DIAGNOSIS — Z993 Dependence on wheelchair: Secondary | ICD-10-CM | POA: Diagnosis not present

## 2022-09-03 DIAGNOSIS — E8889 Other specified metabolic disorders: Secondary | ICD-10-CM | POA: Diagnosis present

## 2022-09-03 DIAGNOSIS — J810 Acute pulmonary edema: Secondary | ICD-10-CM | POA: Diagnosis present

## 2022-09-03 DIAGNOSIS — K2100 Gastro-esophageal reflux disease with esophagitis, without bleeding: Secondary | ICD-10-CM

## 2022-09-03 DIAGNOSIS — I132 Hypertensive heart and chronic kidney disease with heart failure and with stage 5 chronic kidney disease, or end stage renal disease: Secondary | ICD-10-CM | POA: Diagnosis present

## 2022-09-03 LAB — GLUCOSE, CAPILLARY
Glucose-Capillary: 119 mg/dL — ABNORMAL HIGH (ref 70–99)
Glucose-Capillary: 144 mg/dL — ABNORMAL HIGH (ref 70–99)
Glucose-Capillary: 150 mg/dL — ABNORMAL HIGH (ref 70–99)
Glucose-Capillary: 164 mg/dL — ABNORMAL HIGH (ref 70–99)
Glucose-Capillary: 83 mg/dL (ref 70–99)

## 2022-09-03 LAB — T-HELPER CELLS (CD4) COUNT (NOT AT ARMC)
CD4 % Helper T Cell: 26 % — ABNORMAL LOW (ref 33–65)
CD4 T Cell Abs: 357 /uL — ABNORMAL LOW (ref 400–1790)

## 2022-09-03 MED ORDER — ONDANSETRON HCL 4 MG PO TABS: 4.0000 mg | ORAL_TABLET | Freq: Once | ORAL | Status: DC

## 2022-09-03 MED ORDER — CHLORHEXIDINE GLUCONATE CLOTH 2 % EX PADS
6.0000 | MEDICATED_PAD | Freq: Every day | CUTANEOUS | Status: DC
  Administered 2022-09-03: 6 via TOPICAL

## 2022-09-03 NOTE — Assessment & Plan Note (Addendum)
Elevated troponin due to uncontrolled hypertension.  Patient had aggressive ultrafiltration with HD with improvement in volume status.  Systolic blood pressure today 119 to 129 mmHg range.   Plan to continue medical therapy with carvedilol, clonidine, hydralazine.

## 2022-09-03 NOTE — Progress Notes (Addendum)
Progress Note   Patient: Richard [name] HEN:[id number] DOB: 03-21-1956 DOA: 08/31/2022     1 DOS: the patient was seen and examined on 09/03/2022   Brief hospital course: Mr. Hasler was admitted to the hospital with the working diagnosis of hypertensive emergency, in the setting of missing HD.   66 yo male with the past medical history of ESRD on  hemodialysis, hypertension, T2DM, HIV, hx CVA, and seizures, who presented with dyspnea after missing HD as outpatient on 08/30/22. He had sudden and severe dyspnea, his roommate called EMS, patient was found hypoxic with 02 saturation 89% on room air and his blood pressure was 361 mmHg systolic, he was placed on Cpap, received nitroglycerin SL and transported to the ED. On his physical examination his blood pressure was 241/117, HR 115, RR 25 and 02 saturation 100% on non re-breather. Lungs with no wheezing, heart with S1 and S2 present and rhythmic, abdomen with no distention, no lower extremity edema.   Na 142, K 4,8 Cl 104 bicarbonate 22, glucose 110 bun 44 cr 10 High sensitive troponin 223 and 544  Wbc 7,8 hgb 8,4 plt 189  Sars covid 19 negative   Chest radiograph with mild cardiomegaly, positive bilateral interstitial infiltrates, with cephalization of the vasculature, HD cathter in the right IJ, tip at the right atrium.   EKG 116 bpm, normal axis, normal intervals, sinus rhythm with ST elevation in V1 and V2, with no V5 and V6 T wave inversion, positive LVH, (chronic change).   Assessment and Plan: * Hypertensive emergency Elevated troponin due to uncontrolled hypertension.  Patient had ultrafiltration today during HD. His blood pressure has improved with systolic 443 to 154 mmHg,   Plan to continue medical therapy with carvedilol, clonidine, hydralazine.    Acute respiratory failure with hypoxia (HCC) Acute pulmonary edema due to volume overload.  Clinically has been improving, dyspnea has been improving as well. Today his  02 saturation is 100% on 1,5 L/min per [place]  ESRD (end stage renal disease) on dialysis [facility]) Patient has been responding well to hemodialysis and ultrafiltration. He has not been compliant as outpatient with HD.  Will need to continue inpatient renal replacement therapy to achieve his dry weight.   Metabolic bone disease, continue with phoslo.  Anemia of chronic renal disease with iron deficiency, continue with EPO and iron supplementation.   Elevated troponin Troponin [phone] in setting of HTN emergency and pulmonary edema with respiratory distress/failure  He has had no chest pain or changes on EKG Likely demand ischemia in setting of HTN emergency and acute pulmonary edema Continue to trend troponin Echo pending   History of cerebrovascular accident (CVA) with residual deficit Continue blood pressure control, statin and clopidogrel.   AIDS (acquired immune deficiency syndrome) ([place]) Continue dovato and bactrim DS Followed by ID outpatient Needs cd4/HIV quant. Have ordered these    Seizure disorder [facility]) Seizure precautions Continue keppra   Type 2 diabetes mellitus with hyperlipidemia (HCC) A1C in 09/2021 was 5.2 Continue with semglee 5 units nightly SSI and accuchecks qac/hs   GERD (gastroesophageal reflux disease) Continue protonix daily   Hyperlipidemia Continue crestor   Tobacco abuse Declines nicotine patch         Subjective: Patient with no chest pain or dyspnea, his symptoms are improving,   Physical Exam: Vitals:   09/02/22 1811 09/02/22 2100 09/03/22 0618 09/03/22 0943  BP:  (!) 146/68 (!) 140/71 (!) 129/52  Pulse:  96 84 81  Resp:  17 16  18  Temp:  98.4 F (36.9 C) 98 F (36.7 C) 97.6 F (36.4 C)  TempSrc:  Oral Oral Oral  SpO2:  100% 100% 100%  Weight: 75 kg     Height:       Neurology awake and alert ENT with mild pallor Cardiovascular with S1 and S2 present and rhythmic with no gallops, rubs or murmurs Respiratory with no rales   or wheezing Abdomen with no distention No lower extremity edema  Data Reviewed:    Family Communication: no family at the bedside   Disposition: Status is: Observation The patient will require care spanning > 2 midnights and should be moved to inpatient because: Patient will need to continue inpatient renal replacement therapy   Planned Discharge Destination: Home     Author: Tawni Millers, MD 09/03/2022 2:21 PM  For on call review [email or web address].

## 2022-09-03 NOTE — Progress Notes (Signed)
[facility] Progress Note   Subjective:   Seen in room, had HD again yesterday with net UF 1.7L. BP is better. He is feeling nauseated today, requests nausea med. SOB is improving. No CP or dizziness.   Objective Vitals:   09/02/22 1811 09/02/22 2100 09/03/22 0618 09/03/22 0943  BP:  (!) 146/68 (!) 140/71 (!) 129/52  Pulse:  96 84 81  Resp:  17 16 18   Temp:  98.4 F (36.9 C) 98 F (36.7 C) 97.6 F (36.4 C)  TempSrc:  Oral Oral Oral  SpO2:  100% 100% 100%  Weight: 75 kg     Height:       Physical Exam General: Alert male in NAD, nasal cannula not in nares Heart: RRR, no murmurs, rubs or gallops Lungs: CTA bilaterally, respirations unlabored on RA Abdomen: Soft, non-distended, +BS  Extremities: No edema b/l lower extremities Dialysis Access: [facility] with intact bandage  Additional Objective Labs: Basic Metabolic Panel: Recent Labs  Lab 08/31/22 2352 08/31/22 2353 09/01/22 0151 09/02/22 0535  NA 138 139 142 139  K 5.8* 5.9* 4.8 3.7  CL 110  --  104 98  CO2  --   --  22 27  GLUCOSE 133*  --  110* 86  BUN 64*  --  44* 19  CREATININE 11.40*  --  10.86* 6.34*  CALCIUM  --   --  7.5* 8.1*   Liver Function Tests: Recent Labs  Lab 09/01/22 0151  AST 17  ALT 12  ALKPHOS 89  BILITOT 0.5  PROT 6.6  ALBUMIN 2.6*   No results for input(s): "LIPASE", "AMYLASE" in the last 168 hours. CBC: Recent Labs  Lab 08/31/22 2342 08/31/22 2352 08/31/22 2353 09/01/22 0834 09/02/22 0535  WBC 15.2*  --   --  7.8 4.7  NEUTROABS 11.8*  --   --   --   --   HGB 10.9*   < > 11.6* 8.4* 9.5*  HCT 33.3*   < > 34.0* 25.2* 30.1*  MCV 100.9*  --   --  99.6 100.7*  PLT PLATELET CLUMPS NOTED ON SMEAR, COUNT APPEARS ADEQUATE  --   --  189 173   < > = values in this interval not displayed.   Blood Culture    Component Value Date/Time   SDES BLOOD RIGHT ANTECUBITAL 09/01/2022 0122   SPECREQUEST  09/01/2022 0122    BOTTLES DRAWN AEROBIC AND ANAEROBIC Blood Culture adequate  volume   CULT  09/01/2022 0122    NO GROWTH 1 DAY Performed at [facility], [place] [address], [place],  [address]    REPTSTATUS PENDING 09/01/2022 0122    Cardiac Enzymes: No results for input(s): "CKTOTAL", "CKMB", "CKMBINDEX", "TROPONINI" in the last 168 hours. CBG: Recent Labs  Lab 09/02/22 0755 09/02/22 1118 09/02/22 1127 09/02/22 2116 09/03/22 0800  GLUCAP 83 178* 94 164* 83   Iron Studies: No results for input(s): "IRON", "TIBC", "TRANSFERRIN", "FERRITIN" in the last 72 hours. @lablastinr3 @ Studies/Results: ECHOCARDIOGRAM COMPLETE  Result Date: 09/01/2022    ECHOCARDIOGRAM REPORT   Patient Name:   Richard [name] Date of Exam: 09/01/2022 Medical Rec #:  [id number]             Height:       72.0 in Accession #:    [id number]            Weight:       169.5 lb Date of Birth:  1956-03-14  BSA:          1.986 m Patient Age:    66 years              BP:           142/118 mmHg Patient Gender: M                     HR:           96 bpm. Exam Location:  Inpatient Procedure: 2D Echo, Cardiac Doppler and Color Doppler Indications:    Elevated Troponin  History:        Patient has prior history of Echocardiogram examinations, most                 recent 10/20/2021. Stroke; Risk Factors:Hypertension, Diabetes                 and GERD.  Sonographer:    Bernadene Person RDCS Referring Phys: [id number] [place]  1. Left ventricular ejection fraction, by estimation, is 45 to 50%. The left ventricle has mildly decreased function. The left ventricle demonstrates regional wall motion abnormalities (see scoring diagram/findings for description). There is moderate left ventricular hypertrophy. Left ventricular diastolic parameters are consistent with Grade I diastolic dysfunction (impaired relaxation). Elevated left ventricular end-diastolic pressure. There is hypokinesis of the left ventricular, entire inferior wall.  2. Right ventricular systolic function is normal.  The right ventricular size is normal. Tricuspid regurgitation signal is inadequate for assessing PA pressure.  3. Left atrial size was mild to moderately dilated.  4. The mitral valve is normal in structure. No evidence of mitral valve regurgitation. No evidence of mitral stenosis.  5. The aortic valve is tricuspid. There is mild calcification of the aortic valve. Aortic valve regurgitation is not visualized. Aortic valve sclerosis is present, with no evidence of aortic valve stenosis.  6. The inferior vena cava is normal in size with greater than 50% respiratory variability, suggesting right atrial pressure of 3 mmHg. Comparison(s): No significant change from prior study. FINDINGS  Left Ventricle: Left ventricular ejection fraction, by estimation, is 45 to 50%. The left ventricle has mildly decreased function. The left ventricle demonstrates regional wall motion abnormalities. The left ventricular internal cavity size was normal in size. There is moderate left ventricular hypertrophy. Left ventricular diastolic parameters are consistent with Grade I diastolic dysfunction (impaired relaxation). Elevated left ventricular end-diastolic pressure. Right Ventricle: The right ventricular size is normal. No increase in right ventricular wall thickness. Right ventricular systolic function is normal. Tricuspid regurgitation signal is inadequate for assessing PA pressure. Left Atrium: Left atrial size was mild to moderately dilated. Right Atrium: Right atrial size was normal in size. Pericardium: There is no evidence of pericardial effusion. Mitral Valve: The mitral valve is normal in structure. No evidence of mitral valve regurgitation. No evidence of mitral valve stenosis. Tricuspid Valve: The tricuspid valve is normal in structure. Tricuspid valve regurgitation is not demonstrated. No evidence of tricuspid stenosis. Aortic Valve: The aortic valve is tricuspid. There is mild calcification of the aortic valve. Aortic valve  regurgitation is not visualized. Aortic valve sclerosis is present, with no evidence of aortic valve stenosis. Pulmonic Valve: The pulmonic valve was not well visualized. Pulmonic valve regurgitation is not visualized. No evidence of pulmonic stenosis. Aorta: The aortic root, ascending aorta and aortic arch are all structurally normal, with no evidence of dilitation or obstruction. Venous: The inferior vena cava is normal in size with greater than  50% respiratory variability, suggesting right atrial pressure of 3 mmHg. IAS/Shunts: The atrial septum is grossly normal.  LEFT VENTRICLE PLAX 2D LVIDd:         4.90 cm      Diastology LVIDs:         3.70 cm      LV e' medial:    3.00 cm/s LV PW:         1.92 cm      LV E/e' medial:  24.2 LV IVS:        1.83 cm      LV e' lateral:   3.43 cm/s LVOT diam:     2.20 cm      LV E/e' lateral: 21.2 LV SV:         69 LV SV Index:   35 LVOT Area:     3.80 cm  LV Volumes (MOD) LV vol d, MOD A2C: 121.0 ml LV vol d, MOD A4C: 160.0 ml LV vol s, MOD A2C: 63.6 ml LV vol s, MOD A4C: 94.3 ml LV SV MOD A2C:     57.4 ml LV SV MOD A4C:     160.0 ml LV SV MOD BP:      62.4 ml RIGHT VENTRICLE RV S prime:     13.90 cm/s TAPSE (M-mode): 2.5 cm LEFT ATRIUM             Index        RIGHT ATRIUM           Index LA diam:        4.30 cm 2.17 cm/m   RA Area:     15.30 cm LA Vol (A2C):   75.3 ml 37.91 ml/m  RA Volume:   34.90 ml  17.57 ml/m LA Vol (A4C):   56.2 ml 28.30 ml/m LA Biplane Vol: 66.8 ml 33.63 ml/m  AORTIC VALVE LVOT Vmax:   110.00 cm/s LVOT Vmean:  70.200 cm/s LVOT VTI:    0.182 m  AORTA Ao Root diam: 3.40 cm Ao Asc diam:  3.30 cm MITRAL VALVE MV Area (PHT): 6.17 cm     SHUNTS MV Decel Time: 123 msec     Systemic VTI:  0.18 m MV E velocity: 72.70 cm/s   Systemic Diam: 2.20 cm MV A velocity: 137.00 cm/s MV E/A ratio:  0.53 Buford Dresser MD Electronically signed by Buford Dresser MD Signature Date/Time: 09/01/2022/5:09:38 PM    Final    Medications:  sodium chloride       calcium acetate  2,001 mg Oral TID WC   carvedilol  12.5 mg Oral BID WC   Chlorhexidine Gluconate Cloth  6 each Topical Q0600   cloNIDine  0.1 mg Oral TID   clopidogrel  75 mg Oral Daily   darbepoetin (ARANESP) injection - DIALYSIS  100 mcg Subcutaneous Q Mon-1800   docusate sodium  100 mg Oral Daily   dolutegravir-lamiVUDine  1 tablet Oral Daily   doxepin  75 mg Oral QHS   feeding supplement (NEPRO CARB STEADY)  237 mL Oral TID BM   heparin  5,000 Units Subcutaneous Q8H   hydrALAZINE  10 mg Oral Q8H   insulin aspart  0-6 Units Subcutaneous Q4H   insulin glargine-yfgn  5 Units Subcutaneous QHS   levETIRAcetam  500 mg Oral BID   multivitamin  1 tablet Oral QHS   ondansetron  4 mg Oral Once   pantoprazole  40 mg Oral Daily   rosuvastatin  5 mg Oral Daily  sodium chloride flush  3 mL Intravenous Q12H   sulfamethoxazole-trimethoprim  1 tablet Oral Q M,W,F   tamsulosin  0.4 mg Oral Daily    Outpatient Dialysis Orders: [facility] 4hr 400/800 2/2 bath UFP4 Hectorol 44mcg qtx Heparin 1600 bolus Mircera 200 q2weeks (given 12/7)   Assessment/Plan: 1 Acute hypoxic RF: severely underdialyzed and volume overload with EDW listed at 70kg but with all the missed and truncated treatments difficult to determine true EDW.  Suspect we are getting closer, volume status much improved.    Exposure for COVID as well with roommate recently testing positive but patient is asymptomatic with no cough, fevers, sore throat, myalgias.   2 ESRD: TTS- chronically underdialyzed, now back on track. HD again tomorrow.  3 Hypertension: chronically hypertensive, he becomes symptomatic around SBP of 130. Volume and BP much improved s/p HD Monday and Tuesday 4. Anemia of ESRD:  Hgb 9.5. on max ESA as OP- last given 52/8. 5. Metabolic Bone Disease:  continue VDRA and binders 6.  HIV: meds per primary 7.  H/o stroke: R sided weakness and h/o slurred speech 8.  Dispo: pending 9. Nausea: Ongoing this admission,  uremia resolved. Ordered zofran  Anice Paganini, PA-C 09/03/2022, 9:53 AM  [facility] Pager: [phone]

## 2022-09-03 NOTE — Progress Notes (Signed)
Pt states  he don't wear CPAP.

## 2022-09-03 NOTE — Progress Notes (Signed)
Heart Failure Navigator Progress Note  Assessed for Heart & Vascular TOC clinic readiness.  Patient does not meet criteria due to ESRD on HD.   HF Navigation team will sign-off.   Deyana Wnuk, MSN, RN Heart Failure Nurse Navigator   

## 2022-09-03 NOTE — Hospital Course (Addendum)
Mr. Hollar was admitted to the hospital with the working diagnosis of hypertensive emergency, in the setting of missing HD.   66 yo male with the past medical history of ESRD on  hemodialysis, hypertension, T2DM, HIV, hx CVA, and seizures, who presented with dyspnea after missing HD as outpatient on 08/30/22. He had sudden and severe dyspnea, his roommate called EMS, patient was found hypoxic with 02 saturation 89% on room air and his blood pressure was 379 mmHg systolic, he was placed on Cpap, received nitroglycerin SL and transported to the ED. On his physical examination his blood pressure was 241/117, HR 115, RR 25 and 02 saturation 100% on non re-breather. Lungs with no wheezing, heart with S1 and S2 present and rhythmic, abdomen with no distention, no lower extremity edema.   Na 142, K 4,8 Cl 104 bicarbonate 22, glucose 110 bun 44 cr 10 High sensitive troponin 223 and 544  Wbc 7,8 hgb 8,4 plt 189  Sars covid 19 negative   Chest radiograph with mild cardiomegaly, positive bilateral interstitial infiltrates, with cephalization of the vasculature, HD cathter in the right IJ, tip at the right atrium.   EKG 116 bpm, normal axis, normal intervals, sinus rhythm with ST elevation in V1 and V2, with no V5 and V6 T wave inversion, positive LVH, (chronic change).   12/14 patient underwent aggressive inpatient renal replacement therapy with improvement in his volume status.  12/15 patient will return to SNF, continue blood pressure control and close follow up with HD.

## 2022-09-03 NOTE — Progress Notes (Signed)
DAILY PROGRESS NOTE   Patient Name: Richard [name] Date of Encounter: 09/03/2022 Cardiologist: None  Chief Complaint   No complaints  Patient Profile   Richard [name] is a 66 y.o. male with a hx of HTN, DM II, GERD, ESRD on HD TThSat, HIV, CVA, mild HFrEF, noncompliance, chronically elevated troponin and h/o seizure who is being seen 09/02/2022 for the evaluation of abnormal echocardiogram and elevated troponin at the request of Dr. Dwyane Dee.   Subjective   Had dialysis again yesterday- BP much improved overnight and today.   Objective   Vitals:   09/02/22 1811 09/02/22 2100 09/03/22 0618 09/03/22 0943  BP:  (!) 146/68 (!) 140/71 (!) 129/52  Pulse:  96 84 81  Resp:  17 16 18   Temp:  98.4 F (36.9 C) 98 F (36.7 C) 97.6 F (36.4 C)  TempSrc:  Oral Oral Oral  SpO2:  100% 100% 100%  Weight: 75 kg     Height:        Intake/Output Summary (Last 24 hours) at 09/03/2022 0945 Last data filed at 09/03/2022 1610 Gross per 24 hour  Intake 360 ml  Output 1900 ml  Net -1540 ml   Filed Weights   09/01/22 0417 09/02/22 1811  Weight: 76.9 kg 75 kg    Physical Exam   General appearance: alert and no distress Lungs: clear to auscultation bilaterally Heart: regular rate and rhythm Extremities: extremities normal, atraumatic, no cyanosis or edema Neurologic: Grossly normal  Inpatient Medications    Scheduled Meds:  calcium acetate  2,001 mg Oral TID WC   carvedilol  12.5 mg Oral BID WC   Chlorhexidine Gluconate Cloth  6 each Topical Q0600   cloNIDine  0.1 mg Oral TID   clopidogrel  75 mg Oral Daily   darbepoetin (ARANESP) injection - DIALYSIS  100 mcg Subcutaneous Q Mon-1800   docusate sodium  100 mg Oral Daily   dolutegravir-lamiVUDine  1 tablet Oral Daily   doxepin  75 mg Oral QHS   feeding supplement (NEPRO CARB STEADY)  237 mL Oral TID BM   heparin  5,000 Units Subcutaneous Q8H   hydrALAZINE  10 mg Oral Q8H   insulin aspart  0-6 Units  Subcutaneous Q4H   insulin glargine-yfgn  5 Units Subcutaneous QHS   levETIRAcetam  500 mg Oral BID   multivitamin  1 tablet Oral QHS   ondansetron  4 mg Oral Once   pantoprazole  40 mg Oral Daily   rosuvastatin  5 mg Oral Daily   sodium chloride flush  3 mL Intravenous Q12H   sulfamethoxazole-trimethoprim  1 tablet Oral Q M,W,F   tamsulosin  0.4 mg Oral Daily    Continuous Infusions:  sodium chloride      PRN Meds: sodium chloride, acetaminophen **OR** acetaminophen, hydrALAZINE, ondansetron (ZOFRAN) IV, sodium chloride flush   Labs   Results for orders placed or performed during the hospital encounter of 08/31/22 (from the past 48 hour(s))  Glucose, capillary     Status: None   Collection Time: 09/01/22  9:41 PM  Result Value Ref Range   Glucose-Capillary 96 70 - 99 mg/dL    Comment: Glucose reference range applies only to samples taken after fasting for at least 8 hours.  Glucose, capillary     Status: None   Collection Time: 09/02/22 12:01 AM  Result Value Ref Range   Glucose-Capillary 86 70 - 99 mg/dL    Comment: Glucose reference range applies only to samples taken  after fasting for at least 8 hours.  Glucose, capillary     Status: Abnormal   Collection Time: 09/02/22  4:13 AM  Result Value Ref Range   Glucose-Capillary 63 (L) 70 - 99 mg/dL    Comment: Glucose reference range applies only to samples taken after fasting for at least 8 hours.  Basic metabolic panel     Status: Abnormal   Collection Time: 09/02/22  5:35 AM  Result Value Ref Range   Sodium 139 135 - 145 mmol/L   Potassium 3.7 3.5 - 5.1 mmol/L   Chloride 98 98 - 111 mmol/L   CO2 27 22 - 32 mmol/L   Glucose, Bld 86 70 - 99 mg/dL    Comment: Glucose reference range applies only to samples taken after fasting for at least 8 hours.   BUN 19 8 - 23 mg/dL   Creatinine, Ser 6.34 (H) 0.61 - 1.24 mg/dL   Calcium 8.1 (L) 8.9 - 10.3 mg/dL   GFR, Estimated 9 (L) >60 mL/min    Comment: (NOTE) Calculated using  the CKD-EPI Creatinine Equation (2021)    Anion gap 14 5 - 15    Comment: Performed at [facility] [address], [place], [place] [address]  CBC     Status: Abnormal   Collection Time: 09/02/22  5:35 AM  Result Value Ref Range   WBC 4.7 4.0 - 10.5 K/uL   RBC 2.99 (L) 4.22 - 5.81 MIL/uL   Hemoglobin 9.5 (L) 13.0 - 17.0 g/dL   HCT 30.1 (L) 39.0 - 52.0 %   MCV 100.7 (H) 80.0 - 100.0 fL   MCH 31.8 26.0 - 34.0 pg   MCHC 31.6 30.0 - 36.0 g/dL   RDW 16.0 (H) 11.5 - 15.5 %   Platelets 173 150 - 400 K/uL   nRBC 0.6 (H) 0.0 - 0.2 %    Comment: Performed at [facility] [address], [place], [place] [address]  Troponin I (High Sensitivity)     Status: Abnormal   Collection Time: 09/02/22  5:35 AM  Result Value Ref Range   Troponin I (High Sensitivity) 547 (HH) <18 ng/L    Comment: CRITICAL VALUE NOTED. VALUE IS CONSISTENT WITH PREVIOUSLY REPORTED/CALLED VALUE (NOTE) Elevated high sensitivity troponin I (hsTnI) values and significant  changes across serial measurements may suggest ACS but many other  chronic and acute conditions are known to elevate hsTnI results.  Refer to the "Links" section for chest pain algorithms and additional  guidance. Performed at [facility], [place] [address], [place], [place] [address]   Glucose, capillary     Status: Abnormal   Collection Time: 09/02/22  7:19 AM  Result Value Ref Range   Glucose-Capillary 56 (L) 70 - 99 mg/dL    Comment: Glucose reference range applies only to samples taken after fasting for at least 8 hours.  Glucose, capillary     Status: None   Collection Time: 09/02/22  7:55 AM  Result Value Ref Range   Glucose-Capillary 83 70 - 99 mg/dL    Comment: Glucose reference range applies only to samples taken after fasting for at least 8 hours.  Glucose, capillary     Status: Abnormal   Collection Time: 09/02/22 11:18 AM  Result Value Ref Range   Glucose-Capillary 178 (H) 70 - 99 mg/dL    Comment: Glucose reference  range applies only to samples taken after fasting for at least 8 hours.  Glucose, capillary  Status: None   Collection Time: 09/02/22 11:27 AM  Result Value Ref Range   Glucose-Capillary 94 70 - 99 mg/dL    Comment: Glucose reference range applies only to samples taken after fasting for at least 8 hours.  Troponin I (High Sensitivity)     Status: Abnormal   Collection Time: 09/02/22  3:00 PM  Result Value Ref Range   Troponin I (High Sensitivity) 429 (HH) <18 ng/L    Comment: CRITICAL VALUE NOTED. VALUE IS CONSISTENT WITH PREVIOUSLY REPORTED/CALLED VALUE (NOTE) Elevated high sensitivity troponin I (hsTnI) values and significant  changes across serial measurements may suggest ACS but many other  chronic and acute conditions are known to elevate hsTnI results.  Refer to the "Links" section for chest pain algorithms and additional  guidance. Performed at [facility], [place] [address], [place], [place] [address]   Glucose, capillary     Status: Abnormal   Collection Time: 09/02/22  9:16 PM  Result Value Ref Range   Glucose-Capillary 164 (H) 70 - 99 mg/dL    Comment: Glucose reference range applies only to samples taken after fasting for at least 8 hours.  Glucose, capillary     Status: None   Collection Time: 09/03/22  8:00 AM  Result Value Ref Range   Glucose-Capillary 83 70 - 99 mg/dL    Comment: Glucose reference range applies only to samples taken after fasting for at least 8 hours.    ECG   N/A  Telemetry   Sinus rhythm, LVH with repolarization changes - Personally Reviewed  Radiology    ECHOCARDIOGRAM COMPLETE  Result Date: 09/01/2022    ECHOCARDIOGRAM REPORT   Patient Name:   Richard [name] Date of Exam: 09/01/2022 Medical Rec #:  [id number]             Height:       72.0 in Accession #:    [id number]            Weight:       169.5 lb Date of Birth:  October 30, 1955             BSA:          1.986 m Patient Age:    17 years              BP:           142/118  mmHg Patient Gender: M                     HR:           96 bpm. Exam Location:  Inpatient Procedure: 2D Echo, Cardiac Doppler and Color Doppler Indications:    Elevated Troponin  History:        Patient has prior history of Echocardiogram examinations, most                 recent 10/20/2021. Stroke; Risk Factors:Hypertension, Diabetes                 and GERD.  Sonographer:    Bernadene Person RDCS Referring Phys: [id number] [place]  1. Left ventricular ejection fraction, by estimation, is 45 to 50%. The left ventricle has mildly decreased function. The left ventricle demonstrates regional wall motion abnormalities (see scoring diagram/findings for description). There is moderate left ventricular hypertrophy. Left ventricular diastolic parameters are consistent with Grade I diastolic dysfunction (impaired relaxation). Elevated left ventricular end-diastolic pressure. There is hypokinesis of the left ventricular,  entire inferior wall.  2. Right ventricular systolic function is normal. The right ventricular size is normal. Tricuspid regurgitation signal is inadequate for assessing PA pressure.  3. Left atrial size was mild to moderately dilated.  4. The mitral valve is normal in structure. No evidence of mitral valve regurgitation. No evidence of mitral stenosis.  5. The aortic valve is tricuspid. There is mild calcification of the aortic valve. Aortic valve regurgitation is not visualized. Aortic valve sclerosis is present, with no evidence of aortic valve stenosis.  6. The inferior vena cava is normal in size with greater than 50% respiratory variability, suggesting right atrial pressure of 3 mmHg. Comparison(s): No significant change from prior study. FINDINGS  Left Ventricle: Left ventricular ejection fraction, by estimation, is 45 to 50%. The left ventricle has mildly decreased function. The left ventricle demonstrates regional wall motion abnormalities. The left ventricular internal cavity size was  normal in size. There is moderate left ventricular hypertrophy. Left ventricular diastolic parameters are consistent with Grade I diastolic dysfunction (impaired relaxation). Elevated left ventricular end-diastolic pressure. Right Ventricle: The right ventricular size is normal. No increase in right ventricular wall thickness. Right ventricular systolic function is normal. Tricuspid regurgitation signal is inadequate for assessing PA pressure. Left Atrium: Left atrial size was mild to moderately dilated. Right Atrium: Right atrial size was normal in size. Pericardium: There is no evidence of pericardial effusion. Mitral Valve: The mitral valve is normal in structure. No evidence of mitral valve regurgitation. No evidence of mitral valve stenosis. Tricuspid Valve: The tricuspid valve is normal in structure. Tricuspid valve regurgitation is not demonstrated. No evidence of tricuspid stenosis. Aortic Valve: The aortic valve is tricuspid. There is mild calcification of the aortic valve. Aortic valve regurgitation is not visualized. Aortic valve sclerosis is present, with no evidence of aortic valve stenosis. Pulmonic Valve: The pulmonic valve was not well visualized. Pulmonic valve regurgitation is not visualized. No evidence of pulmonic stenosis. Aorta: The aortic root, ascending aorta and aortic arch are all structurally normal, with no evidence of dilitation or obstruction. Venous: The inferior vena cava is normal in size with greater than 50% respiratory variability, suggesting right atrial pressure of 3 mmHg. IAS/Shunts: The atrial septum is grossly normal.  LEFT VENTRICLE PLAX 2D LVIDd:         4.90 cm      Diastology LVIDs:         3.70 cm      LV e' medial:    3.00 cm/s LV PW:         1.92 cm      LV E/e' medial:  24.2 LV IVS:        1.83 cm      LV e' lateral:   3.43 cm/s LVOT diam:     2.20 cm      LV E/e' lateral: 21.2 LV SV:         69 LV SV Index:   35 LVOT Area:     3.80 cm  LV Volumes (MOD) LV vol d, MOD  A2C: 121.0 ml LV vol d, MOD A4C: 160.0 ml LV vol s, MOD A2C: 63.6 ml LV vol s, MOD A4C: 94.3 ml LV SV MOD A2C:     57.4 ml LV SV MOD A4C:     160.0 ml LV SV MOD BP:      62.4 ml RIGHT VENTRICLE RV S prime:     13.90 cm/s TAPSE (M-mode): 2.5 cm LEFT ATRIUM  Index        RIGHT ATRIUM           Index LA diam:        4.30 cm 2.17 cm/m   RA Area:     15.30 cm LA Vol (A2C):   75.3 ml 37.91 ml/m  RA Volume:   34.90 ml  17.57 ml/m LA Vol (A4C):   56.2 ml 28.30 ml/m LA Biplane Vol: 66.8 ml 33.63 ml/m  AORTIC VALVE LVOT Vmax:   110.00 cm/s LVOT Vmean:  70.200 cm/s LVOT VTI:    0.182 m  AORTA Ao Root diam: 3.40 cm Ao Asc diam:  3.30 cm MITRAL VALVE MV Area (PHT): 6.17 cm     SHUNTS MV Decel Time: 123 msec     Systemic VTI:  0.18 m MV E velocity: 72.70 cm/s   Systemic Diam: 2.20 cm MV A velocity: 137.00 cm/s MV E/A ratio:  0.53 Buford Dresser MD Electronically signed by Buford Dresser MD Signature Date/Time: 09/01/2022/5:09:38 PM    Final     Cardiac Studies   See echo above  Assessment   Principal Problem:   Hypertensive emergency Active Problems:   Tobacco abuse   Hyperlipidemia   GERD (gastroesophageal reflux disease)   DM2 (diabetes mellitus, type 2) (HCC)   History of cerebrovascular accident (CVA) with residual deficit   Seizure disorder (HCC)   AIDS (acquired immune deficiency syndrome) (HCC)   Elevated troponin   ESRD (end stage renal disease) on dialysis (HCC)   Acute respiratory failure with hypoxia (HCC)   Plan   No issues overnight - BP much improved today. Main issue is with compliance - he is much better after dialysis and ultrafiltration. Would continue current medications. No indication for further ischemic work-up at this time. Would not consider him to be a great cath/PCI candidate due to compliance issues. D/w Dr. Cathlean Sauer.  [place] will sign off.   Medication Recommendations:  none Other recommendations (labs, testing, etc):   none Follow up as an outpatient:  Dr. Marlou Porch   Time Spent Directly with Patient:  I have spent a total of 25 minutes with the patient reviewing hospital notes, telemetry, EKGs, labs and examining the patient as well as establishing an assessment and plan that was discussed personally with the patient.  > 50% of time was spent in direct patient care.  Length of Stay:  LOS: 1 day   Pixie Casino, MD, [facility], [place] Director of the [facility]  Card[facility] Diplomate of the American Board of Clinical Lipidology Attending Cardiologist  Direct Dial: [phone]  Fax: [phone]  Website:  [email or web address] Mozer 09/03/2022, 9:45 AM

## 2022-09-03 NOTE — Plan of Care (Signed)
  Problem: Education: Goal: Ability to describe self-care measures that may prevent or decrease complications (Diabetes Survival Skills Education) will improve Outcome: Progressing   Problem: Coping: Goal: Ability to adjust to condition or change in health will improve Outcome: Progressing   Problem: Health Behavior/Discharge Planning: Goal: Ability to manage health-related needs will improve Outcome: Progressing   

## 2022-09-03 NOTE — Progress Notes (Signed)
Pt receives out-pt HD at [facility] on TTS. Appears pt admitted from snf per chart review. Will assist as needed.   [place] Renal Navigator [phone] 

## 2022-09-04 LAB — GLUCOSE, CAPILLARY
Glucose-Capillary: 104 mg/dL — ABNORMAL HIGH (ref 70–99)
Glucose-Capillary: 78 mg/dL (ref 70–99)
Glucose-Capillary: 105 mg/dL — ABNORMAL HIGH (ref 70–99)
Glucose-Capillary: 219 mg/dL — ABNORMAL HIGH (ref 70–99)

## 2022-09-04 LAB — CBC
HCT: 28.9 % — ABNORMAL LOW (ref 39.0–52.0)
Hemoglobin: 9.2 g/dL — ABNORMAL LOW (ref 13.0–17.0)
MCH: 32.2 pg (ref 26.0–34.0)
MCHC: 31.8 g/dL (ref 30.0–36.0)
MCV: 101 fL — ABNORMAL HIGH (ref 80.0–100.0)
Platelets: 182 10*3/uL (ref 150–400)
RBC: 2.86 MIL/uL — ABNORMAL LOW (ref 4.22–5.81)
RDW: 16.6 % — ABNORMAL HIGH (ref 11.5–15.5)
WBC: 6.3 10*3/uL (ref 4.0–10.5)
nRBC: 0.3 % — ABNORMAL HIGH (ref 0.0–0.2)

## 2022-09-04 LAB — HIV-1 RNA, PCR (GRAPH) RFX/GENO EDI
HIV-1 RNA BY PCR: 100 copies/mL
HIV-1 RNA Quant, Log: 2 log10copy/mL

## 2022-09-04 LAB — RENAL FUNCTION PANEL
Albumin: 2.4 g/dL — ABNORMAL LOW (ref 3.5–5.0)
Anion gap: 13 (ref 5–15)
BUN: 28 mg/dL — ABNORMAL HIGH (ref 8–23)
CO2: 26 mmol/L (ref 22–32)
Calcium: 8 mg/dL — ABNORMAL LOW (ref 8.9–10.3)
Chloride: 100 mmol/L (ref 98–111)
Creatinine, Ser: 7.37 mg/dL — ABNORMAL HIGH (ref 0.61–1.24)
GFR, Estimated: 8 mL/min — ABNORMAL LOW (ref >60)
Glucose, Bld: 106 mg/dL — ABNORMAL HIGH (ref 70–99)
Phosphorus: 6.2 mg/dL — ABNORMAL HIGH (ref 2.5–4.6)
Potassium: 3.7 mmol/L (ref 3.5–5.1)
Sodium: 139 mmol/L (ref 135–145)

## 2022-09-04 MED ORDER — DOLUTEGRAVIR-LAMIVUDINE 50-300 MG PO TABS
1.0000 | ORAL_TABLET | Freq: Every day | ORAL | Status: DC
  Administered 2022-09-05: 1 via ORAL
  Filled 2022-09-04 (×2): qty 1

## 2022-09-04 MED ORDER — HEPARIN SODIUM (PORCINE) 1000 UNIT/ML IJ SOLN
INTRAMUSCULAR | Status: AC
  Filled 2022-09-04: qty 4

## 2022-09-04 MED ORDER — HEPARIN SODIUM (PORCINE) 1000 UNIT/ML DIALYSIS
1600.0000 [IU] | Freq: Once | INTRAMUSCULAR | Status: DC
  Filled 2022-09-04: qty 2

## 2022-09-04 NOTE — Progress Notes (Addendum)
Received patient in bed to unit.  Alert and oriented.  Informed consent signed and in chart.    Treatment completed: 1107  Patient tolerated well.  Transported back to the room  Alert, without acute distress.  Hand-off given to patient's nurse.   Access used: catheter Access issues: n/a  Total UF removed: 2.5 Medication(s) given: N/A Weight:82.kg    09/04/22 1107  Vitals  Temp 98.2 F (36.8 C)  Temp Source Oral  BP 129/70  MAP (mmHg) 86  BP Location Right Arm  BP Method Automatic  Patient Position (if appropriate) Lying  Pulse Rate 89  Pulse Rate Source Monitor  ECG Heart Rate 89  Resp (!) 21  Oxygen Therapy  SpO2 100 %  O2 Device Nasal Cannula  O2 Flow Rate (L/min) 3 L/min  Patient Activity (if Appropriate) In bed  Pulse Oximetry Type Continuous  During Treatment Monitoring  Intra-Hemodialysis Comments Tx completed;Tolerated well  Post Treatment  Dialyzer Clearance Lightly streaked  Duration of HD Treatment -hour(s) 3 hour(s)  Hemodialysis Intake (mL) 0 mL  Liters Processed 72  Fluid Removed (mL) 2500 mL  Tolerated HD Treatment Yes  Post-Hemodialysis Comments Tx complete. Tolerated well. No complications.  Hemodialysis Catheter Right Internal jugular Double lumen Permanent (Tunneled)  Placement Date/Time: 01/31/22 1543   Placed prior to admission: No  Time Out: Correct patient;Correct site;Correct procedure  Maximum sterile barrier precautions: Hand hygiene;Cap;Mask;Sterile gown;Sterile gloves;Large sterile sheet  Site Prep: Chlorh...  Site Condition No complications  Blue Lumen Status Dead end cap in place;Blood return noted;Heparin locked  Red Lumen Status Dead end cap in place;Blood return noted;Heparin locked  Purple Lumen Status N/A  Catheter fill solution Heparin 1000 units/ml  Catheter fill volume (Arterial) 1.9 cc  Catheter fill volume (Venous) 1.9  Dressing Type Transparent  Dressing Status Antimicrobial disc in place;Clean, Dry, Intact  Drainage  Description None  Dressing Change Due 09/08/22  Post treatment catheter status Capped and [facility]

## 2022-09-04 NOTE — Progress Notes (Signed)
[facility] Progress Note   Subjective:   Seen on HD, reports he is still slightly short of breath. Denies CP, palpitations, dizziness and nausea.  Objective Vitals:   09/03/22 1712 09/03/22 2027 09/04/22 0730 09/04/22 0805  BP: (!) 116/59 (!) 148/67 (!) 148/91 (!) 174/69  Pulse: 93 99 99 77  Resp: 16 18 20 17   Temp: 98.4 F (36.9 C) 97.9 F (36.6 C) 98 F (36.7 C)   TempSrc: Oral Oral Oral   SpO2: 100% 100% 100% 100%  Weight:   84.6 kg   Height:       Physical Exam General: Alert male in NAD, on O2 via [place] Heart: RRR, no murmurs, rubs or gallops Lungs: CTA bilaterally, respirations unlabored  Abdomen: Soft, non-distended, +BS  Extremities: No edema b/l lower extremities Dialysis Access: [facility] accessed  Additional Objective Labs: Basic Metabolic Panel: Recent Labs  Lab 09/01/22 0151 09/02/22 0535 09/04/22 0749  NA 142 139 139  K 4.8 3.7 3.7  CL 104 98 100  CO2 22 27 26   GLUCOSE 110* 86 106*  BUN 44* 19 28*  CREATININE 10.86* 6.34* 7.37*  CALCIUM 7.5* 8.1* 8.0*  PHOS  --   --  6.2*   Liver Function Tests: Recent Labs  Lab 09/01/22 0151 09/04/22 0749  AST 17  --   ALT 12  --   ALKPHOS 89  --   BILITOT 0.5  --   PROT 6.6  --   ALBUMIN 2.6* 2.4*   No results for input(s): "LIPASE", "AMYLASE" in the last 168 hours. CBC: Recent Labs  Lab 08/31/22 2342 08/31/22 2352 09/01/22 0834 09/02/22 0535 09/04/22 0749  WBC 15.2*  --  7.8 4.7 6.3  NEUTROABS 11.8*  --   --   --   --   HGB 10.9*   < > 8.4* 9.5* 9.2*  HCT 33.3*   < > 25.2* 30.1* 28.9*  MCV 100.9*  --  99.6 100.7* 101.0*  PLT PLATELET CLUMPS NOTED ON SMEAR, COUNT APPEARS ADEQUATE  --  189 173 182   < > = values in this interval not displayed.   Blood Culture    Component Value Date/Time   SDES BLOOD RIGHT ANTECUBITAL 09/01/2022 0122   SPECREQUEST  09/01/2022 0122    BOTTLES DRAWN AEROBIC AND ANAEROBIC Blood Culture adequate volume   CULT  09/01/2022 0122    NO GROWTH 2  DAYS Performed at [facility], [place] [address], [place], [place] [address]    REPTSTATUS PENDING 09/01/2022 0122    Cardiac Enzymes: No results for input(s): "CKTOTAL", "CKMB", "CKMBINDEX", "TROPONINI" in the last 168 hours. CBG: Recent Labs  Lab 09/03/22 1154 09/03/22 1714 09/03/22 2002 09/03/22 2356 09/04/22 0350  GLUCAP 144* 150* 119* 164* 104*   Iron Studies: No results for input(s): "IRON", "TIBC", "TRANSFERRIN", "FERRITIN" in the last 72 hours. @lablastinr3 @ Studies/Results: No results found. Medications:  sodium chloride      calcium acetate  2,001 mg Oral TID WC   carvedilol  12.5 mg Oral BID WC   Chlorhexidine Gluconate Cloth  6 each Topical Q0600   cloNIDine  0.1 mg Oral TID   clopidogrel  75 mg Oral Daily   darbepoetin (ARANESP) injection - DIALYSIS  100 mcg Subcutaneous Q Mon-1800   docusate sodium  100 mg Oral Daily   dolutegravir-lamiVUDine  1 tablet Oral Daily   doxepin  75 mg Oral QHS   feeding supplement (NEPRO CARB STEADY)  237 mL Oral TID BM   [  START ON 09/05/2022] heparin  1,600 Units Dialysis Once in dialysis   heparin  5,000 Units Subcutaneous Q8H   hydrALAZINE  10 mg Oral Q8H   insulin aspart  0-6 Units Subcutaneous Q4H   insulin glargine-yfgn  5 Units Subcutaneous QHS   levETIRAcetam  500 mg Oral BID   multivitamin  1 tablet Oral QHS   ondansetron  4 mg Oral Once   pantoprazole  40 mg Oral Daily   rosuvastatin  5 mg Oral Daily   sodium chloride flush  3 mL Intravenous Q12H   sulfamethoxazole-trimethoprim  1 tablet Oral Q M,W,F   tamsulosin  0.4 mg Oral Daily    Outpatient Dialysis Orders: [facility] 4hr 400/800 2/2 bath UFP4 Hectorol 8mcg qtx Heparin 1600 bolus Mircera 200 q2weeks (given 12/7)    Assessment/Plan: 1 Acute hypoxic RF: severely underdialyzed and volume overload with EDW listed at 70kg but with all the missed and truncated treatments difficult to determine true EDW.  ? Reliability of weights. Volume status much  improved.    Exposure for COVID as well with roommate recently testing positive but patient is asymptomatic with no cough, fevers, sore throat, myalgias.   2 ESRD: TTS- chronically underdialyzed, now back on track.  3 Hypertension: chronically hypertensive. Volume and BP much improved s/p HD Monday and Tuesday 4. Anemia of ESRD:  Hgb 9.2. on max ESA as OP- last given 94/4. 5. Metabolic Bone Disease:  Calcium controlled, phos elevated. continue VDRA and binders 6.  HIV: meds per primary 7.  H/o stroke: R sided weakness and h/o slurred speech 8.  Dispo: pending 9. Nausea: Ongoing this admission, uremia resolved. Improved today  Anice Paganini, PA-C 09/04/2022, 8:42 AM  [facility] Pager: [phone]

## 2022-09-04 NOTE — Progress Notes (Signed)
Progress Note   Patient: Richard [name] CBS:[id number] DOB: 05/24/1956 DOA: 08/31/2022     2 DOS: the patient was seen and examined on 09/04/2022   Brief hospital course: Mr. Weldon was admitted to the hospital with the working diagnosis of hypertensive emergency, in the setting of missing HD.   66 yo male with the past medical history of ESRD on  hemodialysis, hypertension, T2DM, HIV, hx CVA, and seizures, who presented with dyspnea after missing HD as outpatient on 08/30/22. He had sudden and severe dyspnea, his roommate called EMS, patient was found hypoxic with 02 saturation 89% on room air and his blood pressure was 846 mmHg systolic, he was placed on Cpap, received nitroglycerin SL and transported to the ED. On his physical examination his blood pressure was 241/117, HR 115, RR 25 and 02 saturation 100% on non re-breather. Lungs with no wheezing, heart with S1 and S2 present and rhythmic, abdomen with no distention, no lower extremity edema.   Na 142, K 4,8 Cl 104 bicarbonate 22, glucose 110 bun 44 cr 10 High sensitive troponin 223 and 544  Wbc 7,8 hgb 8,4 plt 189  Sars covid 19 negative   Chest radiograph with mild cardiomegaly, positive bilateral interstitial infiltrates, with cephalization of the vasculature, HD cathter in the right IJ, tip at the right atrium.   EKG 116 bpm, normal axis, normal intervals, sinus rhythm with ST elevation in V1 and V2, with no V5 and V6 T wave inversion, positive LVH, (chronic change).   12/14 patient underwent renal replacement therapy with improvement in his volume status.  Plan to return to SNF tomorrow.   Assessment and Plan: * Hypertensive emergency Elevated troponin due to uncontrolled hypertension.  Patient had aggressive ultrafiltration with HD with improvement in volume status.  Systolic blood pressure today 140 range.   Plan to continue medical therapy with carvedilol, clonidine, hydralazine.    Acute respiratory failure with  hypoxia (HCC) Acute pulmonary edema due to volume overload.  Continue to improve his oxygenation, today is 100% on 3 L/min per [place].  Continue oxymetry monitoring.  Patient may need supplemental 02 per [place] as outpatient.   ESRD (end stage renal disease) on dialysis [facility]) Patient has been responding well to hemodialysis and ultrafiltration. He has not been compliant as outpatient with HD.   His volume is improving,  Patient had HD today and will continue with T,TH,Sat schedule.   Metabolic bone disease, continue with phoslo.  Anemia of chronic renal disease with iron deficiency, continue with EPO and iron supplementation.   Elevated troponin Troponin [phone] in setting of HTN emergency and pulmonary edema with respiratory distress/failure  He has had no chest pain or changes on EKG Likely demand ischemia in setting of HTN emergency and acute pulmonary edema Continue to trend troponin Echo pending   History of cerebrovascular accident (CVA) with residual deficit Continue blood pressure control, statin and clopidogrel.  Positive chronic right sided hemiparesis.   AIDS (acquired immune deficiency syndrome) ([place]) Continue antiretroviral therapy  CD 4 improved and PJP prophylaxis has been held.    Seizure disorder [facility]) Seizure precautions Continue keppra   Type 2 diabetes mellitus with hyperlipidemia (HCC) A1C in 09/2021 was 5.2 Continue with semglee 5 units nightly Glucose has been stable.  Patient tolerating po well.   GERD (gastroesophageal reflux disease) Continue protonix daily   Hyperlipidemia Continue crestor   Tobacco abuse Declines nicotine patch         Subjective: Patient with improvement in dyspnea,  he had HD today, no chest pain, feeling weak and deconditioned today.   Physical Exam: Vitals:   09/04/22 1000 09/04/22 1030 09/04/22 1107 09/04/22 1228  BP: 131/73 127/66 129/70 (!) 146/77  Pulse: 84 87 89 88  Resp: 17 (!) 21 (!) 21 18  Temp:   98.2  F (36.8 C) 97.9 F (36.6 C)  TempSrc:   Oral Oral  SpO2: 100% 100% 100% 100%  Weight:   82.3 kg   Height:       Neurology awake and alert, right sided hemiparesis (chronic) ENT with no pallor Cardiovascular with S1 and S2 present and rhythmic with no gallops or rubs Respiratory with no rales or wheezing Abdomen with no distention  No lower extremity edema  Data Reviewed:    Family Communication: no family at the bedside   Disposition: Status is: Inpatient Remains inpatient appropriate because: pending return to SNF on 12/15  Planned Discharge Destination: Skilled nursing facility     Author: Tawni Millers, MD 09/04/2022 2:45 PM  For on call review [email or web address].

## 2022-09-05 DIAGNOSIS — I161 Hypertensive emergency: Secondary | ICD-10-CM | POA: Diagnosis not present

## 2022-09-05 DIAGNOSIS — G40909 Epilepsy, unspecified, not intractable, without status epilepticus: Secondary | ICD-10-CM | POA: Diagnosis not present

## 2022-09-05 DIAGNOSIS — J9601 Acute respiratory failure with hypoxia: Secondary | ICD-10-CM | POA: Diagnosis not present

## 2022-09-05 DIAGNOSIS — I150 Renovascular hypertension: Secondary | ICD-10-CM

## 2022-09-05 DIAGNOSIS — B20 Human immunodeficiency virus [HIV] disease: Secondary | ICD-10-CM | POA: Diagnosis not present

## 2022-09-05 LAB — GLUCOSE, CAPILLARY
Glucose-Capillary: 117 mg/dL — ABNORMAL HIGH (ref 70–99)
Glucose-Capillary: 106 mg/dL — ABNORMAL HIGH (ref 70–99)
Glucose-Capillary: 201 mg/dL — ABNORMAL HIGH (ref 70–99)

## 2022-09-05 MED ORDER — INSULIN ASPART 100 UNIT/ML IJ SOLN: 0.0000 [IU] | Freq: Every day | INTRAMUSCULAR | Status: DC

## 2022-09-05 MED ORDER — INSULIN ASPART 100 UNIT/ML IJ SOLN: 0.0000 [IU] | Freq: Three times a day (TID) | INTRAMUSCULAR | Status: DC

## 2022-09-05 NOTE — TOC Transition Note (Signed)
Transition of Care [facility]) - CM/SW Discharge Note   Patient Details  Name: Richard [name] MRN: [id number] Date of Birth: 1955-11-09  Transition of Care [facility]) CM/SW Contact:  Milinda Antis, [place] Phone Number: 09/05/2022, 11:38 AM   Clinical Narrative:    Patient will DC to: [facility] date:  09/05/2022 Family notified: patient notified as he is alert and oriented Transport by:  Corey Harold   Per MD patient ready for DC to SNF. RN to call report prior to discharge ([phone] room 215B ). RN, patient, and facility notified of DC. Discharge Summary sent to facility. DC packet on chart. Ambulance transport will be requested for patient.   CSW will sign off for now as social work intervention is no longer needed. Please consult [place] again if new needs arise.    Final next level of care: Skilled Nursing Facility Barriers to Discharge: No Barriers Identified   Patient Goals and CMS Choice        Discharge Placement              Patient chooses bed at:  [facility]) Patient to be transferred to facility by: [place] Name of family member notified: patient  alert and oriented Patient and family notified of of transfer: 09/05/22  Discharge Plan and Services                                     Social Determinants of Health (SDOH) Interventions     Readmission Risk Interventions    10/22/2021   11:56 AM 02/15/2020    3:49 PM  Readmission Risk Prevention Plan  Transportation Screening Complete Complete  PCP or Specialist Appt within 5-7 Days  Not Complete  Not Complete comments  pending disposition  Home Care Screening  Complete  Medication Review (RN CM)  Referral to Pharmacy  Medication Review (RN Care Manager) Complete   PCP or Specialist appointment within 3-5 days of discharge Complete   HRI or Home Care Consult Complete   SW Recovery Care/Counseling Consult Complete   [place] Complete

## 2022-09-05 NOTE — Progress Notes (Signed)
Subjective: Seen in room said tolerated dialysis yesterday 2.5 L UF, no significant shortness of breath ,O2 sat 100% room air ,BP stable  Objective Vital signs in last 24 hours: Vitals:   09/04/22 1228 09/04/22 1507 09/04/22 2150 09/05/22 0533  BP: (!) 146/77  (!) 119/51 (!) 126/57  Pulse: 88  96 85  Resp: 18  18 18   Temp: 97.9 F (36.6 C)  98.5 F (36.9 C) 97.7 F (36.5 C)  TempSrc: Oral  Oral Oral  SpO2: 100% 97% 100% 99%  Weight:      Height:       Weight change:   Physical Exam: General: Alert chronically ill male slurred speech history of slurred speech, NAD Heart: RRR no MRG Lungs: CTA bilaterally no labored breathing Abdomen: NABS, soft NTND Extremities: No pedal edema Dialysis Access: TTS, left upper arm AV graft positive bruit  Outpatient Dialysis Orders: [place] TTS 4hr 400/800 2/2 bath UFP4 Hectorol 65mcg qtx Heparin 1600 bolus Mircera 200 q2weeks (given 12/7)     Problem/plan  1 Acute hypoxic RF: Resolved with serial dialysis, history of severely underdialyzed and volume overload with EDW listed at 70kg but with all the missed and truncated treatments difficult to determine true EDW.  ? Reliability of weights. Volume status much improved.  After serial dialysis. 2 ESRD: TTS- chronically underdialyzed, now back on track.  3 Hypertension: chronically hypertensive. Volume and BP much improved s/p HD Monday and Tuesday, yesterday Thursday 4. Anemia of ESRD:  Hgb 9.2. on max ESA as OP- last given 93/7. 5. Metabolic Bone Disease:  Calcium controlled, phos elevated. continue VDRA and binders 6.  HIV: meds per primary 7.  H/o stroke: R sided weakness and h/o slurred speech 8.  Dispo: Discharge back to nursing home probable today   Ernest Haber, [place] [phone] 09/05/2022,12:34 PM  LOS: 3 days   Labs: Basic Metabolic Panel: Recent Labs  Lab 09/01/22 0151 09/02/22 0535 09/04/22 0749  NA 142 139 139  K 4.8 3.7 3.7  CL 104 98  100  CO2 22 27 26   GLUCOSE 110* 86 106*  BUN 44* 19 28*  CREATININE 10.86* 6.34* 7.37*  CALCIUM 7.5* 8.1* 8.0*  PHOS  --   --  6.2*   Liver Function Tests: Recent Labs  Lab 09/01/22 0151 09/04/22 0749  AST 17  --   ALT 12  --   ALKPHOS 89  --   BILITOT 0.5  --   PROT 6.6  --   ALBUMIN 2.6* 2.4*   No results for input(s): "LIPASE", "AMYLASE" in the last 168 hours. No results for input(s): "AMMONIA" in the last 168 hours. CBC: Recent Labs  Lab 08/31/22 2342 08/31/22 2352 09/01/22 0834 09/02/22 0535 09/04/22 0749  WBC 15.2*  --  7.8 4.7 6.3  NEUTROABS 11.8*  --   --   --   --   HGB 10.9*   < > 8.4* 9.5* 9.2*  HCT 33.3*   < > 25.2* 30.1* 28.9*  MCV 100.9*  --  99.6 100.7* 101.0*  PLT PLATELET CLUMPS NOTED ON SMEAR, COUNT APPEARS ADEQUATE  --  189 173 182   < > = values in this interval not displayed.   Cardiac Enzymes: No results for input(s): "CKTOTAL", "CKMB", "CKMBINDEX", "TROPONINI" in the last 168 hours. CBG: Recent Labs  Lab 09/04/22 1235 09/04/22 1742 09/04/22 2153 09/05/22 0738 09/05/22 1117  GLUCAP 78 105* 219* 117* 106*    Studies/Results: No results found. Medications:  sodium chloride      calcium acetate  2,001 mg Oral TID WC   carvedilol  12.5 mg Oral BID WC   Chlorhexidine Gluconate Cloth  6 each Topical Q0600   cloNIDine  0.1 mg Oral TID   clopidogrel  75 mg Oral Daily   darbepoetin (ARANESP) injection - DIALYSIS  100 mcg Subcutaneous Q Mon-1800   docusate sodium  100 mg Oral Daily   dolutegravir-lamiVUDine  1 tablet Oral Q supper   doxepin  75 mg Oral QHS   feeding supplement (NEPRO CARB STEADY)  237 mL Oral TID BM   heparin  5,000 Units Subcutaneous Q8H   hydrALAZINE  10 mg Oral Q8H   insulin aspart  0-5 Units Subcutaneous QHS   insulin aspart  0-6 Units Subcutaneous TID WC   insulin glargine-yfgn  5 Units Subcutaneous QHS   levETIRAcetam  500 mg Oral BID   multivitamin  1 tablet Oral QHS   ondansetron  4 mg Oral Once    pantoprazole  40 mg Oral Daily   rosuvastatin  5 mg Oral Daily   sodium chloride flush  3 mL Intravenous Q12H   tamsulosin  0.4 mg Oral Daily

## 2022-09-05 NOTE — TOC Progression Note (Signed)
Transition of Care [facility]) - Initial/Assessment Note    Patient Details  Name: Johncharles Fusselman MRN: [id number] Date of Birth: 02-16-1956  Transition of Care [facility]) CM/SW Contact:    Milinda Antis, LCSWA Phone Number: 09/05/2022, 9:26 AM  Clinical Narrative:                 09:25- LCSW contacted [facility] to inquire about the patient returning today as he is LT at the facility and is awaiting a response.    TOC following.         Patient Goals and CMS Choice        Expected Discharge Plan and Services                                                Prior Living Arrangements/Services                       Activities of Daily Living Home Assistive Devices/Equipment: Wheelchair ADL Screening (condition at time of admission) Patient's cognitive ability adequate to safely complete daily activities?: Yes Is the patient deaf or have difficulty hearing?: No Does the patient have difficulty seeing, even when wearing glasses/contacts?: No Does the patient have difficulty concentrating, remembering, or making decisions?: No Patient able to express need for assistance with ADLs?: Yes Does the patient have difficulty dressing or bathing?: Yes Independently performs ADLs?: No Communication: Independent Dressing (OT): Needs assistance Is this a change from baseline?: Pre-admission baseline Grooming: Needs assistance Is this a change from baseline?: Pre-admission baseline Feeding: Needs assistance Is this a change from baseline?: Pre-admission baseline Bathing: Needs assistance Is this a change from baseline?: Pre-admission baseline Toileting: Needs assistance Is this a change from baseline?: Pre-admission baseline In/Out Bed: Needs assistance Is this a change from baseline?: Pre-admission baseline Walks in Home: Needs assistance Is this a change from baseline?: Pre-admission baseline Does the patient have difficulty walking or climbing stairs?:  Yes Weakness of Legs: Both Weakness of Arms/Hands: Right  Permission Sought/Granted                  Emotional Assessment              Admission diagnosis:  Respiratory distress [R06.03] ESRD (end stage renal disease) on dialysis ([place]) [N18.6, Z99.2] Acute respiratory failure with hypoxia ([place]) [J96.01] Hypertensive emergency [I16.1] Renal failure [N19] Patient Active Problem List   Diagnosis Date Noted   Renal failure 09/03/2022   Acute respiratory failure with hypoxia ([place]) 09/01/2022   Lack of housing 03/31/2022   Hematoma 03/19/2022   Other disorders of calcium metabolism 03/05/2022   Fall from non-moving wheelchair 10/19/2021   Hypertensive crisis 10/19/2021   Hypermagnesemia 10/19/2021   Allergic rhinitis 10/19/2021   Buttock pain 08/30/2021   Volume overload 04/14/2021   Elevated troponin 02/18/2021   Acute on chronic diastolic CHF (congestive heart failure) ([place]) 02/18/2021   Depression 02/18/2021   Hemorrhoids 01/11/2021   Hyperkalemia 01/10/2021   Dialysis patient, noncompliant    Dependence on supplemental oxygen 12/20/2020   ESRD (end stage renal disease) on dialysis ([place]) 12/20/2020   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 ([place]) 12/20/2020   Human immunodeficiency virus (HIV) disease ([place]) 12/20/2020   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   Syncope 06/01/2020   Secondary hyperparathyroidism of renal origin ([place]) 05/28/2020  Hypokalemia 05/16/2020   Allergy, unspecified, initial encounter 05/08/2020   Anaphylactic shock, unspecified, initial encounter 05/08/2020   Anemia in chronic kidney disease 26/94/8546   Complication of vascular dialysis catheter 05/08/2020   Dependence on renal dialysis ([place]) 05/08/2020   Headache, unspecified 05/08/2020   Iron deficiency anemia, unspecified 05/08/2020   Cerebellar stroke syndrome 05/07/2020   Encounter for feeding tube placement    Weakness    Creatinine  elevation    Goals of care, counseling/discussion    Hypertensive urgency    Palliative care by specialist    AIDS (acquired immune deficiency syndrome) ([place]) 04/13/2020   Seizure disorder ([place]) 04/10/2020   History of cerebrovascular accident (CVA) with residual deficit 03/19/2020   Neuropathy in diabetes ([name])    Migraines    GERD (gastroesophageal reflux disease)    Type 2 diabetes mellitus with hyperlipidemia ([place])    Arthritis    Hyperlipidemia 08/25/2012   Chronic kidney disease, stage 4 (severe) ([place]) 08/23/2012   Tobacco abuse 08/23/2012   Malnutrition of moderate degree (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 [place] [address] Phone: [phone] Fax: [phone]  [place], [place] - [address] [place] [address] Phone: [phone] Fax: [phone]     Social Determinants of Health ([place]) Interventions    Readmission Risk Interventions    10/22/2021   11:56 AM 02/15/2020    3:49 PM  Readmission Risk Prevention Plan  Transportation Screening Complete Complete  PCP or Specialist Appt within 5-7 Days  Not Complete  Not Complete comments  pending disposition  Home Care Screening  Complete  Medication Review (RN CM)  Referral to Pharmacy  Medication Review (RN Care Manager) Complete   PCP or Specialist appointment within 3-5 days of discharge Complete   HRI or Home Care Consult Complete   SW Recovery Care/Counseling Consult Complete   Palliative Care Screening Not [place] Complete

## 2022-09-05 NOTE — Progress Notes (Signed)
Attempted calling to give report again but call was unanswered.  Message left, call back number left.

## 2022-09-05 NOTE — Care Management Important Message (Signed)
Important Message  Patient Details  Name: Richard [name] MRN: [id number] Date of Birth: [birth year]   Medicare Important Message Given:  Yes     Orbie Pyo 09/05/2022, 3:04 PM

## 2022-09-05 NOTE — Progress Notes (Signed)
[place] x 2 for report at [phone], calls were unanswered.

## 2022-09-05 NOTE — Discharge Summary (Signed)
Physician Discharge Summary   Patient: Richard [name] MRN: [id number] DOB: October 22, 1955  Admit date:     08/31/2022  Discharge date: 09/06/22  Discharge Physician: Tawni Millers   PCP: Jodi Marble, MD   Recommendations at discharge:    Follow up with Dr Elijio Miles in 7 to 10 days. Continue blood pressure control with carvedilol, clonidine, and hydralazine. Continue renal replacement therapy with ultrafiltration as outpatient on Tuesday, Thursday and Saturday schedule.   Discharge Diagnoses: Principal Problem:   Hypertensive emergency Active Problems:   Acute respiratory failure with hypoxia (HCC)   ESRD (end stage renal disease) on dialysis ([place])   History of cerebrovascular accident (CVA) with residual deficit   AIDS (acquired immune deficiency syndrome) ([place])   Seizure disorder ([place])   Type 2 diabetes mellitus with hyperlipidemia ([place])   GERD (gastroesophageal reflux disease)  Resolved Problems:   Acute respiratory failure ([facility])   Hypertension  Hospital Course: Richard [name] was admitted to the hospital with the working diagnosis of hypertensive emergency, in the setting of missing HD.   66 yo male with the past medical history of ESRD on  hemodialysis, hypertension, T2DM, HIV, hx CVA, and seizures, who presented with dyspnea after missing HD as outpatient on 08/30/22. He had sudden and severe dyspnea, his roommate called EMS, patient was found hypoxic with 02 saturation 89% on room air and his blood pressure was 932 mmHg systolic, he was placed on Cpap, received nitroglycerin SL and transported to the ED. On his physical examination his blood pressure was 241/117, HR 115, RR 25 and 02 saturation 100% on non re-breather. Lungs with no wheezing, heart with S1 and S2 present and rhythmic, abdomen with no distention, no lower extremity edema.   Na 142, K 4,8 Cl 104 bicarbonate 22, glucose 110 bun 44 cr 10 High sensitive troponin 223 and 544  Wbc 7,8 hgb 8,4 plt  189  Sars covid 19 negative   Chest radiograph with mild cardiomegaly, positive bilateral interstitial infiltrates, with cephalization of the vasculature, HD cathter in the right IJ, tip at the right atrium.   EKG 116 bpm, normal axis, normal intervals, sinus rhythm with ST elevation in V1 and V2, with no V5 and V6 T wave inversion, positive LVH, (chronic change).   12/14 patient underwent aggressive inpatient renal replacement therapy with improvement in his volume status.  12/15 patient will return to SNF, continue blood pressure control and close follow up with HD.    Assessment and Plan: * Hypertensive emergency Elevated troponin due to uncontrolled hypertension.  Patient had aggressive ultrafiltration with HD with improvement in volume status.  Systolic blood pressure today 119 to 129 mmHg range.   Plan to continue medical therapy with carvedilol, clonidine, hydralazine.    Acute respiratory failure with hypoxia (HCC) Acute pulmonary edema due to volume overload.  At the time of his discharge his 02 saturation is 100% on room air.   ESRD (end stage renal disease) on dialysis [facility]) Patient has been responding well to hemodialysis and ultrafiltration. He has not been compliant as outpatient with HD.   His volume has improved, patient will continue outpatient renal replacement therapy on Tuesday, Thursday and Saturday schedule.   Metabolic bone disease, continue with phoslo.  Anemia of chronic renal disease with iron deficiency, continue with EPO and iron supplementation.   Elevated troponin Troponin [phone] in setting of HTN emergency and pulmonary edema with respiratory distress/failure  He has had no chest pain or changes on EKG Likely  demand ischemia in setting of HTN emergency and acute pulmonary edema Continue to trend troponin Echo pending   History of cerebrovascular accident (CVA) with residual deficit Continue blood pressure control, statin and clopidogrel.   Positive chronic right sided hemiparesis.   AIDS (acquired immune deficiency syndrome) ([place]) Continue antiretroviral therapy  CD 4 improved and PJP prophylaxis has been held.    Seizure disorder [facility]) Seizure precautions Continue keppra   Type 2 diabetes mellitus with hyperlipidemia (HCC) A1C in 09/2021 was 5.2 Continue with semglee 5 units nightly Glucose has been stable.  Patient tolerating po well.   GERD (gastroesophageal reflux disease) Continue protonix daily   Hyperlipidemia Continue crestor   Tobacco abuse Declines nicotine patch          Consultants: nephrology  Procedures performed: none   Disposition: Skilled nursing facility Diet recommendation:  Cardiac and Carb modified diet DISCHARGE MEDICATION: Allergies as of 09/05/2022       Reactions   Oxycodone Nausea And Vomiting   NOT DOCUMENTED ON MAR        Medication List     STOP taking these medications    sulfamethoxazole-trimethoprim 800-160 MG tablet Commonly known as: BACTRIM DS   traMADol 50 MG tablet Commonly known as: ULTRAM       TAKE these medications    Accu-Chek Aviva Plus test strip Generic drug: glucose blood USE 1 strip TO test blood sugar 2 TIMES DAILY TO 3 TIMES DAILY   acetaminophen 325 MG tablet Commonly known as: TYLENOL Take 650 mg by mouth every 6 (six) hours as needed for moderate pain, fever or mild pain.   b complex-vitamin c-folic acid 0.8 MG Tabs tablet Take 1 tablet by mouth daily.   blood glucose meter kit and supplies Kit Dispense based on patient and insurance preference. Check blood sugar 4 times a day   calcium acetate 667 MG capsule Commonly known as: PHOSLO Take 2,001 mg by mouth 3 (three) times daily with meals.   carvedilol 12.5 MG tablet Commonly known as: COREG Take 12.5 mg by mouth 2 (two) times daily.   cloNIDine 0.1 MG tablet Commonly known as: CATAPRES Take 1 tablet (0.1 mg total) by mouth 3 (three) times daily.   clopidogrel  75 MG tablet Commonly known as: PLAVIX Take 1 tablet (75 mg total) by mouth daily.   Commode 3-In-1 Misc Use as directed   Transfer Bench Misc Use as directed   docusate sodium 100 MG capsule Commonly known as: COLACE Take 100 mg by mouth daily.   Dovato 50-300 MG tablet Generic drug: dolutegravir-lamiVUDine Take 1 tablet by mouth daily.   doxepin 75 MG capsule Commonly known as: SINEQUAN Take 75 mg by mouth at bedtime.   Ergocalciferol 50 MCG (2000 UT) Tabs Take 2,000 Units by mouth daily.   feeding supplement (NEPRO CARB STEADY) Liqd Take 237 mLs by mouth 3 (three) times daily between meals.   hydrALAZINE 10 MG tablet Commonly known as: APRESOLINE Take 10 mg by mouth 3 (three) times daily.   hydrocortisone cream 1 % Apply 1 Application topically every 6 (six) hours as needed for itching. Apply to affected areas on back arms   insulin glargine-yfgn 100 UNIT/ML Pen Commonly known as: SEMGLEE Inject 5 Units into the skin at bedtime.   Insulin Pen Needle 31G X 8 MM Misc Commonly known as: Sure Comfort Pen Needles Use as directed twice daily with Novolog flex pen   levETIRAcetam 500 MG tablet Commonly known as: KEPPRA Take 500 mg  by mouth 2 (two) times daily.   Lidocaine 3 % Crea Apply 1 application topically See admin instructions. Apply 1 times a day on Tuesday, Thursday and Saturday prior to dialysis   ondansetron 4 MG tablet Commonly known as: ZOFRAN Take 4 mg by mouth every 8 (eight) hours as needed for vomiting or nausea.   pantoprazole 40 MG tablet Commonly known as: PROTONIX TAKE 1 TABLET BY MOUTH EVERY DAY   Retacrit 20000 UNIT/ML injection Generic drug: epoetin alfa-epbx Inject 20,000 Units into the skin every Monday.   rosuvastatin 5 MG tablet Commonly known as: CRESTOR Take 5 mg by mouth daily.   tamsulosin 0.4 MG Caps capsule Commonly known as: FLOMAX TAKE 1 CAPSULE BY MOUTH EVERY DAY   triamcinolone cream 0.1 % Commonly known as:  KENALOG Apply 1 Application topically in the morning and at bedtime. Apply to back and arms for rash twice a day   TRUEplus Insulin Syringe 31G X 5/16" 1 ML Misc Generic drug: Insulin Syringe-Needle U-100 Use pen needle with insulin 2 times daily        Discharge Exam: Filed Weights   09/02/22 1811 09/04/22 0730 09/04/22 1107  Weight: 75 kg 84.6 kg 82.3 kg   BP (!) 126/57   Pulse 85   Temp 97.7 F (36.5 C) (Oral)   Resp 18   Ht 6' (1.829 m)   Wt 82.3 kg   SpO2 99%   BMI 24.61 kg/m   Patient with no chest pain or dyspnea, no nausea or vomiting.  Neurology awake and alert, chronic right sided hemiparesis  ENT with no pallor Cardiovascular with S1 and S2 present and rhythmic with no gallops, rubs or murmurs Respiratory with no rales or wheezing  Abdomen with no distention  No lower extremity edema  Condition at discharge: stable  The results of significant diagnostics from this hospitalization (including imaging, microbiology, ancillary and laboratory) are listed below for reference.   Imaging Studies: ECHOCARDIOGRAM COMPLETE  Result Date: 09/01/2022    ECHOCARDIOGRAM REPORT   Patient Name:   Richard [name] Date of Exam: 09/01/2022 Medical Rec #:  [id number]             Height:       72.0 in Accession #:    [id number]            Weight:       169.5 lb Date of Birth:  01/29/1956             BSA:          1.986 m Patient Age:    59 years              BP:           142/118 mmHg Patient Gender: M                     HR:           96 bpm. Exam Location:  Inpatient Procedure: 2D Echo, Cardiac Doppler and Color Doppler Indications:    Elevated Troponin  History:        Patient has prior history of Echocardiogram examinations, most                 recent 10/20/2021. Stroke; Risk Factors:Hypertension, Diabetes                 and GERD.  Sonographer:    Bernadene Person RDCS Referring Phys: [id number] [place]  1.  Left ventricular ejection fraction, by estimation, is 45  to 50%. The left ventricle has mildly decreased function. The left ventricle demonstrates regional wall motion abnormalities (see scoring diagram/findings for description). There is moderate left ventricular hypertrophy. Left ventricular diastolic parameters are consistent with Grade I diastolic dysfunction (impaired relaxation). Elevated left ventricular end-diastolic pressure. There is hypokinesis of the left ventricular, entire inferior wall.  2. Right ventricular systolic function is normal. The right ventricular size is normal. Tricuspid regurgitation signal is inadequate for assessing PA pressure.  3. Left atrial size was mild to moderately dilated.  4. The mitral valve is normal in structure. No evidence of mitral valve regurgitation. No evidence of mitral stenosis.  5. The aortic valve is tricuspid. There is mild calcification of the aortic valve. Aortic valve regurgitation is not visualized. Aortic valve sclerosis is present, with no evidence of aortic valve stenosis.  6. The inferior vena cava is normal in size with greater than 50% respiratory variability, suggesting right atrial pressure of 3 mmHg. Comparison(s): No significant change from prior study. FINDINGS  Left Ventricle: Left ventricular ejection fraction, by estimation, is 45 to 50%. The left ventricle has mildly decreased function. The left ventricle demonstrates regional wall motion abnormalities. The left ventricular internal cavity size was normal in size. There is moderate left ventricular hypertrophy. Left ventricular diastolic parameters are consistent with Grade I diastolic dysfunction (impaired relaxation). Elevated left ventricular end-diastolic pressure. Right Ventricle: The right ventricular size is normal. No increase in right ventricular wall thickness. Right ventricular systolic function is normal. Tricuspid regurgitation signal is inadequate for assessing PA pressure. Left Atrium: Left atrial size was mild to moderately dilated.  Right Atrium: Right atrial size was normal in size. Pericardium: There is no evidence of pericardial effusion. Mitral Valve: The mitral valve is normal in structure. No evidence of mitral valve regurgitation. No evidence of mitral valve stenosis. Tricuspid Valve: The tricuspid valve is normal in structure. Tricuspid valve regurgitation is not demonstrated. No evidence of tricuspid stenosis. Aortic Valve: The aortic valve is tricuspid. There is mild calcification of the aortic valve. Aortic valve regurgitation is not visualized. Aortic valve sclerosis is present, with no evidence of aortic valve stenosis. Pulmonic Valve: The pulmonic valve was not well visualized. Pulmonic valve regurgitation is not visualized. No evidence of pulmonic stenosis. Aorta: The aortic root, ascending aorta and aortic arch are all structurally normal, with no evidence of dilitation or obstruction. Venous: The inferior vena cava is normal in size with greater than 50% respiratory variability, suggesting right atrial pressure of 3 mmHg. IAS/Shunts: The atrial septum is grossly normal.  LEFT VENTRICLE PLAX 2D LVIDd:         4.90 cm      Diastology LVIDs:         3.70 cm      LV e' medial:    3.00 cm/s LV PW:         1.92 cm      LV E/e' medial:  24.2 LV IVS:        1.83 cm      LV e' lateral:   3.43 cm/s LVOT diam:     2.20 cm      LV E/e' lateral: 21.2 LV SV:         69 LV SV Index:   35 LVOT Area:     3.80 cm  LV Volumes (MOD) LV vol d, MOD A2C: 121.0 ml LV vol d, MOD A4C: 160.0 ml LV vol s, MOD  A2C: 63.6 ml LV vol s, MOD A4C: 94.3 ml LV SV MOD A2C:     57.4 ml LV SV MOD A4C:     160.0 ml LV SV MOD BP:      62.4 ml RIGHT VENTRICLE RV S prime:     13.90 cm/s TAPSE (M-mode): 2.5 cm LEFT ATRIUM             Index        RIGHT ATRIUM           Index LA diam:        4.30 cm 2.17 cm/m   RA Area:     15.30 cm LA Vol (A2C):   75.3 ml 37.91 ml/m  RA Volume:   34.90 ml  17.57 ml/m LA Vol (A4C):   56.2 ml 28.30 ml/m LA Biplane Vol: 66.8 ml 33.63  ml/m  AORTIC VALVE LVOT Vmax:   110.00 cm/s LVOT Vmean:  70.200 cm/s LVOT VTI:    0.182 m  AORTA Ao Root diam: 3.40 cm Ao Asc diam:  3.30 cm MITRAL VALVE MV Area (PHT): 6.17 cm     SHUNTS MV Decel Time: 123 msec     Systemic VTI:  0.18 m MV E velocity: 72.70 cm/s   Systemic Diam: 2.20 cm MV A velocity: 137.00 cm/s MV E/A ratio:  0.53 Buford Dresser MD Electronically signed by Buford Dresser MD Signature Date/Time: 09/01/2022/5:09:38 PM    Final    DG Chest Port 1 View  Result Date: 08/31/2022 CLINICAL DATA:  Shortness of breath. EXAM: PORTABLE CHEST 1 VIEW COMPARISON:  Radiograph 08/14/2019 FINDINGS: Right-sided dialysis catheter in place. Stable cardiomegaly. Vascular congestion with possible central perihilar edema. No large pleural effusion on this AP view. No confluent airspace disease. No pneumothorax. Skin fold projects over the left hemithorax. IMPRESSION: Cardiomegaly with vascular congestion and possible central perihilar edema. Electronically Signed   By: Keith Rake M.D.   On: 08/31/2022 23:35   DG Chest 1 View  Result Date: 08/13/2022 CLINICAL DATA:  Chest pain.  Respiratory distress. EXAM: CHEST  1 VIEW COMPARISON:  07/22/2022 FINDINGS: Right IJ dialysis catheter is noted with tips in the superior cavoatrial junction. Heart size appears normal. Mild increase interstitial markings identified concerning for pulmonary edema. No pleural effusion or airspace disease. IMPRESSION: Mild increase interstitial markings concerning for pulmonary edema. Electronically Signed   By: Kerby Moors M.D.   On: 08/13/2022 07:25    Microbiology: Results for orders placed or performed during the hospital encounter of 08/31/22  Resp panel by RT-PCR (RSV, Flu A&B, Covid) Anterior Nasal Swab     Status: None   Collection Time: 08/31/22 11:13 PM   Specimen: Anterior Nasal Swab  Result Value Ref Range Status   SARS Coronavirus 2 by RT PCR NEGATIVE NEGATIVE Final    Comment:  (NOTE) SARS-CoV-2 target nucleic acids are NOT DETECTED.  The SARS-CoV-2 RNA is generally detectable in upper respiratory specimens during the acute phase of infection. The lowest concentration of SARS-CoV-2 viral copies this assay can detect is 138 copies/mL. A negative result does not preclude SARS-Cov-2 infection and should not be used as the sole basis for treatment or other patient management decisions. A negative result may occur with  improper specimen collection/handling, submission of specimen other than nasopharyngeal swab, presence of viral mutation(s) within the areas targeted by this assay, and inadequate number of viral copies(<138 copies/mL). A negative result must be combined with clinical observations, patient history, and epidemiological information. The expected  result is Negative.  Fact Sheet for Patients:  EntrepreneurPulse.com.au  Fact Sheet for Healthcare Providers:  IncredibleEmployment.be  This test is no t yet approved or cleared by the [place] FDA and  has been authorized for detection and/or diagnosis of SARS-CoV-2 by FDA under an Emergency Use Authorization (EUA). This EUA will remain  in effect (meaning this test can be used) for the duration of the COVID-19 declaration under Section 564(b)(1) of the Act, 21 U.S.C.section 360bbb-3(b)(1), unless the authorization is terminated  or revoked sooner.       Influenza A by PCR NEGATIVE NEGATIVE Final   Influenza B by PCR NEGATIVE NEGATIVE Final    Comment: (NOTE) The Xpert Xpress SARS-CoV-2/FLU/RSV plus assay is intended as an aid in the diagnosis of influenza from Nasopharyngeal swab specimens and should not be used as a sole basis for treatment. Nasal washings and aspirates are unacceptable for Xpert Xpress SARS-CoV-2/FLU/RSV testing.  Fact Sheet for Patients: EntrepreneurPulse.com.au  Fact Sheet for Healthcare  Providers: IncredibleEmployment.be  This test is not yet approved or cleared by the [place] FDA and has been authorized for detection and/or diagnosis of SARS-CoV-2 by FDA under an Emergency Use Authorization (EUA). This EUA will remain in effect (meaning this test can be used) for the duration of the COVID-19 declaration under Section 564(b)(1) of the Act, 21 U.S.C. section 360bbb-3(b)(1), unless the authorization is terminated or revoked.     Resp Syncytial Virus by PCR NEGATIVE NEGATIVE Final    Comment: (NOTE) Fact Sheet for Patients: EntrepreneurPulse.com.au  Fact Sheet for Healthcare Providers: IncredibleEmployment.be  This test is not yet approved or cleared by the [place] FDA and has been authorized for detection and/or diagnosis of SARS-CoV-2 by FDA under an Emergency Use Authorization (EUA). This EUA will remain in effect (meaning this test can be used) for the duration of the COVID-19 declaration under Section 564(b)(1) of the Act, 21 U.S.C. section 360bbb-3(b)(1), unless the authorization is terminated or revoked.  Performed at [facility], [place] [address] [place], [place] [address]   Culture, blood (routine x 2)     Status: None (Preliminary result)   Collection Time: 09/01/22  1:22 AM   Specimen: BLOOD  Result Value Ref Range Status   Specimen Description BLOOD RIGHT ANTECUBITAL  Final   Special Requests   Final    BOTTLES DRAWN AEROBIC AND ANAEROBIC Blood Culture adequate volume   Culture   Final    NO GROWTH 4 DAYS Performed at [facility], [place] [address], [place], [place] [address]    Report Status PENDING  Incomplete    Labs: CBC: Recent Labs  Lab 08/31/22 2342 08/31/22 2352 08/31/22 2353 09/01/22 0834 09/02/22 0535 09/04/22 0749  WBC 15.2*  --   --  7.8 4.7 6.3  NEUTROABS 11.8*  --   --   --   --   --   HGB 10.9* 11.6* 11.6* 8.4* 9.5* 9.2*  HCT 33.3* 34.0* 34.0*  25.2* 30.1* 28.9*  MCV 100.9*  --   --  99.6 100.7* 101.0*  PLT PLATELET CLUMPS NOTED ON SMEAR, COUNT APPEARS ADEQUATE  --   --  189 173 672   Basic Metabolic Panel: Recent Labs  Lab 08/31/22 2352 08/31/22 2353 09/01/22 0151 09/02/22 0535 09/04/22 0749  NA 138 139 142 139 139  K 5.8* 5.9* 4.8 3.7 3.7  CL 110  --  104 98 100  CO2  --   --  22 27 26  GLUCOSE 133*  --  110* 86 106*  BUN 64*  --  44* 19 28*  CREATININE 11.40*  --  10.86* 6.34* 7.37*  CALCIUM  --   --  7.5* 8.1* 8.0*  PHOS  --   --   --   --  6.2*   Liver Function Tests: Recent Labs  Lab 09/01/22 0151 09/04/22 0749  AST 17  --   ALT 12  --   ALKPHOS 89  --   BILITOT 0.5  --   PROT 6.6  --   ALBUMIN 2.6* 2.4*   CBG: Recent Labs  Lab 09/04/22 0350 09/04/22 1235 09/04/22 1742 09/04/22 2153 09/05/22 0738  GLUCAP 104* 78 105* 219* 117*    Discharge time spent: greater than 30 minutes.  Signed: Tawni Millers, MD [facility] 09/05/2022

## 2022-09-06 LAB — RENAL FUNCTION PANEL
Albumin: 2.6 g/dL — ABNORMAL LOW (ref 3.5–5.0)
Anion gap: 17 — ABNORMAL HIGH (ref 5–15)
BUN: 39 mg/dL — ABNORMAL HIGH (ref 8–23)
CO2: 25 mmol/L (ref 22–32)
Calcium: 8.9 mg/dL (ref 8.9–10.3)
Chloride: 97 mmol/L — ABNORMAL LOW (ref 98–111)
Creatinine, Ser: 8.58 mg/dL — ABNORMAL HIGH (ref 0.61–1.24)
GFR, Estimated: 6 mL/min — ABNORMAL LOW (ref >60)
Glucose, Bld: 119 mg/dL — ABNORMAL HIGH (ref 70–99)
Phosphorus: 6.8 mg/dL — ABNORMAL HIGH (ref 2.5–4.6)
Potassium: 3.5 mmol/L (ref 3.5–5.1)
Sodium: 139 mmol/L (ref 135–145)

## 2022-09-06 LAB — GLUCOSE, CAPILLARY
Glucose-Capillary: 152 mg/dL — ABNORMAL HIGH (ref 70–99)
Glucose-Capillary: 190 mg/dL — ABNORMAL HIGH (ref 70–99)

## 2022-09-06 LAB — CULTURE, BLOOD (ROUTINE X 2)
Culture: NO GROWTH
Special Requests: ADEQUATE

## 2022-09-06 LAB — CBC
HCT: 31.2 % — ABNORMAL LOW (ref 39.0–52.0)
Hemoglobin: 10.1 g/dL — ABNORMAL LOW (ref 13.0–17.0)
MCH: 32.5 pg (ref 26.0–34.0)
MCHC: 32.4 g/dL (ref 30.0–36.0)
MCV: 100.3 fL — ABNORMAL HIGH (ref 80.0–100.0)
Platelets: 179 10*3/uL (ref 150–400)
RBC: 3.11 MIL/uL — ABNORMAL LOW (ref 4.22–5.81)
RDW: 17.3 % — ABNORMAL HIGH (ref 11.5–15.5)
WBC: 6.5 10*3/uL (ref 4.0–10.5)
nRBC: 0.3 % — ABNORMAL HIGH (ref 0.0–0.2)

## 2022-09-06 MED ORDER — ALTEPLASE 2 MG IJ SOLR: 2.0000 mg | Freq: Once | INTRAMUSCULAR | Status: DC | PRN

## 2022-09-06 MED ORDER — HEPARIN SODIUM (PORCINE) 1000 UNIT/ML DIALYSIS
1000.0000 [IU] | INTRAMUSCULAR | Status: DC | PRN
  Filled 2022-09-06: qty 1

## 2022-09-06 MED ORDER — ANTICOAGULANT SODIUM CITRATE 4% (200MG/5ML) IV SOLN: 5.0000 mL | Status: DC | PRN

## 2022-09-06 MED ORDER — HEPARIN SODIUM (PORCINE) 1000 UNIT/ML IJ SOLN
1600.0000 [IU] | Freq: Once | INTRAMUSCULAR | Status: AC
  Administered 2022-09-06: 1600 [IU]

## 2022-09-06 NOTE — Progress Notes (Signed)
Pt was discharged and taken back to [facility].   Foster Simpson Richard [name]

## 2022-09-06 NOTE — Progress Notes (Signed)
Pt unable to dc yesterday due to late [facility] pick up time. SW spoke to [place] at [facility] who confirmed pt is able to return today, room 215-B. RN provided with number for report. Will call [facility] after pt returns from HD.   Wandra Feinstein, MSW, LCSW [phone] (coverage)

## 2022-09-06 NOTE — Progress Notes (Signed)
[facility] called for transport; however, there are 10 calls holding for [facility] and there is a possibility they will not be able to transport until tomorrow morning. RN updated.   Wandra Feinstein, MSW, LCSW [phone] (coverage)

## 2022-09-06 NOTE — Progress Notes (Signed)
Patient was not able to be transfer yesterday to SNF due to timing. Today he continues to feel well with no dyspnea or chest pain, tolerating po well.   BP (!) 149/72 (BP Location: Right Arm)   Pulse 83   Temp (!) 97.3 F (36.3 C) (Oral)   Resp 18   Ht 6' (1.829 m)   Wt 82.3 kg   SpO2 100%   BMI 24.61 kg/m   Neurology awake and alert ENT with mild pallor Cardiovascular with S1 and S2 present and rhythmic Respiratory with no rales or wheezing Abdomen with no distention No lower extremity edema   Plan for discharge today after completing hemodialysis.

## 2022-09-06 NOTE — Progress Notes (Signed)
This RN called [facility] and was told that patient was cancelled for today because patient stated he was not going until he ate his food first. Corey Harold also stated that [facility] will not take patient after midnight. This RN asked if they could go ahead and put patient down for tomorrow morning, however this RN was told that they will not be dispatching anyone tomorrow and to call the non-emergent hotline at [phone] to have someone get pt tomorrow.   Foster Simpson [place]

## 2022-09-06 NOTE — Progress Notes (Signed)
Subjective: Evidently not transported by[facility] today [place], he has no complaints this a.m., HD today then hopefully discharge /discussed with admit team  Objective Vital signs in last 24 hours: Vitals:   09/05/22 0533 09/05/22 1436 09/05/22 1707 09/05/22 2100  BP: (!) 126/57 (!) 129/56 (!) 149/72   Pulse: 85 79 83   Resp: 18     Temp: 97.7 F (36.5 C)  (!) 97.3 F (36.3 C)   TempSrc: Oral  Oral Other (Comment)  SpO2: 99% 100%    Weight:      Height:       Weight change:   Physical Exam: General: Alert chronically ill male slurred speech history of slurred speech, NAD Heart: RRR no MRG Lungs: CTA bilaterally no labored breathing Abdomen: NABS, soft NTND Extremities: No pedal edema Dialysis Access: TDC, LUA AVGG+ bruit, mature  Outpatient Dialysis Orders: [facility] 4hr 400/800 2/2 bath UFP4 Hectorol 3mcg qtx Heparin 1600 bolus Mircera 200 q2weeks (given 12/7) Access= TDC, LUA AVGG (inserted 02/28/22 [facility], however he is refusing use of AVG)  Problem/plan  1 Acute hypoxic RF: Resolved with serial dialysis, history of severely underdialyzed and volume overload with EDW listed at 70kg but with all the missed and truncated treatments difficult to determine true EDW.  ? Reliability of Wts.//  overload vol  status resolved after serial dialysis.Marland Kitchen   2 ESRD: TTS- chronically underdialyzed, now back on track HD today on schedule then hopefully discharge back to NH . 3 Hypertension: chronically hypertensive. Volume and BP much improved s/p HD Monday and Tuesday, Thursday HD   4. Anemia of ESRD:  Hgb 9.2. on max ESA as OP- last given 35/0.  5. Metabolic Bone Disease:  Calcium controlled, phos 6.2 continue VDRA and binders  6.  HIV: meds per primary  7.  H/o stroke: R sided weakness and h/o slurred speech  8.  Dispo: Discharge back to nursing home today after HD  Ernest Haber, PA-C [facility] [phone] 09/06/2022,10:19 AM  LOS: 4 days   Labs: Basic  Metabolic Panel: Recent Labs  Lab 09/01/22 0151 09/02/22 0535 09/04/22 0749  NA 142 139 139  K 4.8 3.7 3.7  CL 104 98 100  CO2 22 27 26   GLUCOSE 110* 86 106*  BUN 44* 19 28*  CREATININE 10.86* 6.34* 7.37*  CALCIUM 7.5* 8.1* 8.0*  PHOS  --   --  6.2*   Liver Function Tests: Recent Labs  Lab 09/01/22 0151 09/04/22 0749  AST 17  --   ALT 12  --   ALKPHOS 89  --   BILITOT 0.5  --   PROT 6.6  --   ALBUMIN 2.6* 2.4*   No results for input(s): "LIPASE", "AMYLASE" in the last 168 hours. No results for input(s): "AMMONIA" in the last 168 hours. CBC: Recent Labs  Lab 08/31/22 2342 08/31/22 2352 09/01/22 0834 09/02/22 0535 09/04/22 0749  WBC 15.2*  --  7.8 4.7 6.3  NEUTROABS 11.8*  --   --   --   --   HGB 10.9*   < > 8.4* 9.5* 9.2*  HCT 33.3*   < > 25.2* 30.1* 28.9*  MCV 100.9*  --  99.6 100.7* 101.0*  PLT PLATELET CLUMPS NOTED ON SMEAR, COUNT APPEARS ADEQUATE  --  189 173 182   < > = values in this interval not displayed.   Cardiac Enzymes: No results for input(s): "CKTOTAL", "CKMB", "CKMBINDEX", "TROPONINI" in the last 168 hours. CBG: Recent Labs  Lab 09/04/22  1742 09/04/22 2153 09/05/22 0738 09/05/22 1117 09/05/22 1705  GLUCAP 105* 219* 117* 106* 201*    Studies/Results: No results found. Medications:  sodium chloride      calcium acetate  2,001 mg Oral TID WC   carvedilol  12.5 mg Oral BID WC   Chlorhexidine Gluconate Cloth  6 each Topical Q0600   cloNIDine  0.1 mg Oral TID   clopidogrel  75 mg Oral Daily   darbepoetin (ARANESP) injection - DIALYSIS  100 mcg Subcutaneous Q Mon-1800   docusate sodium  100 mg Oral Daily   dolutegravir-lamiVUDine  1 tablet Oral Q supper   doxepin  75 mg Oral QHS   feeding supplement (NEPRO CARB STEADY)  237 mL Oral TID BM   heparin  5,000 Units Subcutaneous Q8H   hydrALAZINE  10 mg Oral Q8H   insulin aspart  0-5 Units Subcutaneous QHS   insulin aspart  0-6 Units Subcutaneous TID WC   insulin glargine-yfgn  5 Units  Subcutaneous QHS   levETIRAcetam  500 mg Oral BID   multivitamin  1 tablet Oral QHS   ondansetron  4 mg Oral Once   pantoprazole  40 mg Oral Daily   rosuvastatin  5 mg Oral Daily   sodium chloride flush  3 mL Intravenous Q12H   tamsulosin  0.4 mg Oral Daily

## 2022-09-06 NOTE — Progress Notes (Signed)
Received patient in bed to unit.  Alert and oriented.  Informed consent signed and in chart.   Treatment initiated: 1348 Treatment completed: 1648   Patient tolerated well.  Transported back to the room  Alert, without acute distress.  Hand-off given to patient's nurse.   Access used: HD cath Access issues: NA  Total UF removed: 2075ml Medication(s) given: Heparin 1600 units bolus, Heparin Dwells 3800 units Post HD VS: see above Post HD weight: 80kg   [facility]  09/06/22 1706  Vitals  Temp 98.7 F (37.1 C)  Temp Source Oral  BP 139/71  MAP (mmHg) 91  BP Location Right Arm  BP Method Automatic  Patient Position (if appropriate) Lying  Pulse Rate 92  Pulse Rate Source Monitor  ECG Heart Rate 93  Resp 15  Oxygen Therapy  SpO2 100 %  O2 Device Room Air  Pulse Oximetry Type Continuous

## 2022-09-08 DIAGNOSIS — N186 End stage renal disease: Secondary | ICD-10-CM | POA: Diagnosis not present

## 2022-09-08 DIAGNOSIS — G4700 Insomnia, unspecified: Secondary | ICD-10-CM | POA: Diagnosis not present

## 2022-09-08 DIAGNOSIS — I161 Hypertensive emergency: Secondary | ICD-10-CM | POA: Diagnosis not present

## 2022-09-08 DIAGNOSIS — E559 Vitamin D deficiency, unspecified: Secondary | ICD-10-CM | POA: Diagnosis not present

## 2022-09-08 DIAGNOSIS — E785 Hyperlipidemia, unspecified: Secondary | ICD-10-CM | POA: Diagnosis not present

## 2022-09-08 DIAGNOSIS — I10 Essential (primary) hypertension: Secondary | ICD-10-CM | POA: Diagnosis not present

## 2022-09-08 DIAGNOSIS — R569 Unspecified convulsions: Secondary | ICD-10-CM | POA: Diagnosis not present

## 2022-09-08 DIAGNOSIS — B20 Human immunodeficiency virus [HIV] disease: Secondary | ICD-10-CM | POA: Diagnosis not present

## 2022-09-08 DIAGNOSIS — K219 Gastro-esophageal reflux disease without esophagitis: Secondary | ICD-10-CM | POA: Diagnosis not present

## 2022-09-11 DIAGNOSIS — N2581 Secondary hyperparathyroidism of renal origin: Secondary | ICD-10-CM | POA: Diagnosis not present

## 2022-09-11 DIAGNOSIS — E785 Hyperlipidemia, unspecified: Secondary | ICD-10-CM | POA: Diagnosis not present

## 2022-09-11 DIAGNOSIS — R569 Unspecified convulsions: Secondary | ICD-10-CM | POA: Diagnosis not present

## 2022-09-11 DIAGNOSIS — G4700 Insomnia, unspecified: Secondary | ICD-10-CM | POA: Diagnosis not present

## 2022-09-11 DIAGNOSIS — I161 Hypertensive emergency: Secondary | ICD-10-CM | POA: Diagnosis not present

## 2022-09-11 DIAGNOSIS — Z992 Dependence on renal dialysis: Secondary | ICD-10-CM | POA: Diagnosis not present

## 2022-09-11 DIAGNOSIS — K219 Gastro-esophageal reflux disease without esophagitis: Secondary | ICD-10-CM | POA: Diagnosis not present

## 2022-09-11 DIAGNOSIS — E559 Vitamin D deficiency, unspecified: Secondary | ICD-10-CM | POA: Diagnosis not present

## 2022-09-11 DIAGNOSIS — B20 Human immunodeficiency virus [HIV] disease: Secondary | ICD-10-CM | POA: Diagnosis not present

## 2022-09-11 DIAGNOSIS — N186 End stage renal disease: Secondary | ICD-10-CM | POA: Diagnosis not present

## 2022-09-12 DIAGNOSIS — R509 Fever, unspecified: Secondary | ICD-10-CM | POA: Diagnosis not present

## 2022-09-12 DIAGNOSIS — E861 Hypovolemia: Secondary | ICD-10-CM | POA: Diagnosis not present

## 2022-09-12 DIAGNOSIS — R5383 Other fatigue: Secondary | ICD-10-CM | POA: Diagnosis not present

## 2022-09-12 DIAGNOSIS — R5381 Other malaise: Secondary | ICD-10-CM | POA: Diagnosis not present

## 2022-09-13 DIAGNOSIS — R509 Fever, unspecified: Secondary | ICD-10-CM | POA: Diagnosis not present

## 2022-09-13 DIAGNOSIS — I10 Essential (primary) hypertension: Secondary | ICD-10-CM | POA: Diagnosis not present

## 2022-09-16 DIAGNOSIS — Z720 Tobacco use: Secondary | ICD-10-CM | POA: Diagnosis not present

## 2022-09-16 DIAGNOSIS — F321 Major depressive disorder, single episode, moderate: Secondary | ICD-10-CM | POA: Diagnosis not present

## 2022-09-18 DIAGNOSIS — N186 End stage renal disease: Secondary | ICD-10-CM | POA: Diagnosis not present

## 2022-09-18 DIAGNOSIS — N2581 Secondary hyperparathyroidism of renal origin: Secondary | ICD-10-CM | POA: Diagnosis not present

## 2022-09-18 DIAGNOSIS — Z992 Dependence on renal dialysis: Secondary | ICD-10-CM | POA: Diagnosis not present

## 2022-09-19 ENCOUNTER — Other Ambulatory Visit: Payer: Self-pay

## 2022-09-19 ENCOUNTER — Encounter (HOSPITAL_COMMUNITY): Payer: Self-pay

## 2022-09-19 ENCOUNTER — Emergency Department (HOSPITAL_COMMUNITY): Payer: Medicare HMO

## 2022-09-19 ENCOUNTER — Inpatient Hospital Stay (HOSPITAL_COMMUNITY)
Admission: EM | Admit: 2022-09-19 | Discharge: 2022-09-26 | DRG: 438 | Disposition: A | Discharge status: Skilled Nursing Facility | Payer: Medicare HMO | Source: Skilled Nursing Facility | Attending: Internal Medicine | Admitting: Internal Medicine

## 2022-09-19 DIAGNOSIS — Z79899 Other long term (current) drug therapy: Secondary | ICD-10-CM | POA: Diagnosis not present

## 2022-09-19 DIAGNOSIS — Z794 Long term (current) use of insulin: Secondary | ICD-10-CM

## 2022-09-19 DIAGNOSIS — G40909 Epilepsy, unspecified, not intractable, without status epilepticus: Secondary | ICD-10-CM

## 2022-09-19 DIAGNOSIS — D631 Anemia in chronic kidney disease: Secondary | ICD-10-CM | POA: Diagnosis present

## 2022-09-19 DIAGNOSIS — I10 Essential (primary) hypertension: Secondary | ICD-10-CM | POA: Diagnosis present

## 2022-09-19 DIAGNOSIS — B20 Human immunodeficiency virus [HIV] disease: Secondary | ICD-10-CM | POA: Diagnosis present

## 2022-09-19 DIAGNOSIS — Z8661 Personal history of infections of the central nervous system: Secondary | ICD-10-CM

## 2022-09-19 DIAGNOSIS — Z992 Dependence on renal dialysis: Secondary | ICD-10-CM

## 2022-09-19 DIAGNOSIS — F17210 Nicotine dependence, cigarettes, uncomplicated: Secondary | ICD-10-CM | POA: Diagnosis present

## 2022-09-19 DIAGNOSIS — E1140 Type 2 diabetes mellitus with diabetic neuropathy, unspecified: Secondary | ICD-10-CM | POA: Diagnosis present

## 2022-09-19 DIAGNOSIS — Z5320 Procedure and treatment not carried out because of patient's decision for unspecified reasons: Secondary | ICD-10-CM | POA: Diagnosis not present

## 2022-09-19 DIAGNOSIS — R471 Dysarthria and anarthria: Secondary | ICD-10-CM | POA: Diagnosis present

## 2022-09-19 DIAGNOSIS — I120 Hypertensive chronic kidney disease with stage 5 chronic kidney disease or end stage renal disease: Secondary | ICD-10-CM | POA: Diagnosis present

## 2022-09-19 DIAGNOSIS — E785 Hyperlipidemia, unspecified: Secondary | ICD-10-CM | POA: Diagnosis present

## 2022-09-19 DIAGNOSIS — I959 Hypotension, unspecified: Secondary | ICD-10-CM | POA: Diagnosis not present

## 2022-09-19 DIAGNOSIS — I69351 Hemiplegia and hemiparesis following cerebral infarction affecting right dominant side: Secondary | ICD-10-CM | POA: Diagnosis not present

## 2022-09-19 DIAGNOSIS — K5900 Constipation, unspecified: Secondary | ICD-10-CM | POA: Diagnosis not present

## 2022-09-19 DIAGNOSIS — E11649 Type 2 diabetes mellitus with hypoglycemia without coma: Secondary | ICD-10-CM | POA: Diagnosis not present

## 2022-09-19 DIAGNOSIS — K853 Drug induced acute pancreatitis without necrosis or infection: Secondary | ICD-10-CM | POA: Diagnosis not present

## 2022-09-19 DIAGNOSIS — E1122 Type 2 diabetes mellitus with diabetic chronic kidney disease: Secondary | ICD-10-CM | POA: Diagnosis present

## 2022-09-19 DIAGNOSIS — Z8249 Family history of ischemic heart disease and other diseases of the circulatory system: Secondary | ICD-10-CM

## 2022-09-19 DIAGNOSIS — R109 Unspecified abdominal pain: Secondary | ICD-10-CM | POA: Diagnosis not present

## 2022-09-19 DIAGNOSIS — N200 Calculus of kidney: Secondary | ICD-10-CM | POA: Diagnosis not present

## 2022-09-19 DIAGNOSIS — R112 Nausea with vomiting, unspecified: Secondary | ICD-10-CM | POA: Diagnosis not present

## 2022-09-19 DIAGNOSIS — K2100 Gastro-esophageal reflux disease with esophagitis, without bleeding: Secondary | ICD-10-CM | POA: Diagnosis not present

## 2022-09-19 DIAGNOSIS — E875 Hyperkalemia: Secondary | ICD-10-CM | POA: Diagnosis present

## 2022-09-19 DIAGNOSIS — E8729 Other acidosis: Secondary | ICD-10-CM | POA: Diagnosis not present

## 2022-09-19 DIAGNOSIS — Z7902 Long term (current) use of antithrombotics/antiplatelets: Secondary | ICD-10-CM

## 2022-09-19 DIAGNOSIS — Z8616 Personal history of COVID-19: Secondary | ICD-10-CM | POA: Diagnosis not present

## 2022-09-19 DIAGNOSIS — R1084 Generalized abdominal pain: Secondary | ICD-10-CM | POA: Diagnosis not present

## 2022-09-19 DIAGNOSIS — K859 Acute pancreatitis without necrosis or infection, unspecified: Secondary | ICD-10-CM | POA: Diagnosis present

## 2022-09-19 DIAGNOSIS — K219 Gastro-esophageal reflux disease without esophagitis: Secondary | ICD-10-CM | POA: Diagnosis present

## 2022-09-19 DIAGNOSIS — N281 Cyst of kidney, acquired: Secondary | ICD-10-CM | POA: Diagnosis not present

## 2022-09-19 DIAGNOSIS — Z823 Family history of stroke: Secondary | ICD-10-CM

## 2022-09-19 DIAGNOSIS — N186 End stage renal disease: Secondary | ICD-10-CM | POA: Diagnosis present

## 2022-09-19 DIAGNOSIS — I6930 Unspecified sequelae of cerebral infarction: Secondary | ICD-10-CM | POA: Diagnosis not present

## 2022-09-19 DIAGNOSIS — E872 Acidosis, unspecified: Secondary | ICD-10-CM | POA: Diagnosis present

## 2022-09-19 DIAGNOSIS — Z7401 Bed confinement status: Secondary | ICD-10-CM | POA: Diagnosis not present

## 2022-09-19 DIAGNOSIS — M898X9 Other specified disorders of bone, unspecified site: Secondary | ICD-10-CM | POA: Diagnosis present

## 2022-09-19 DIAGNOSIS — R4781 Slurred speech: Secondary | ICD-10-CM | POA: Diagnosis present

## 2022-09-19 DIAGNOSIS — Z91199 Patient's noncompliance with other medical treatment and regimen due to unspecified reason: Secondary | ICD-10-CM

## 2022-09-19 DIAGNOSIS — N250 Renal osteodystrophy: Secondary | ICD-10-CM | POA: Diagnosis not present

## 2022-09-19 DIAGNOSIS — R748 Abnormal levels of other serum enzymes: Secondary | ICD-10-CM | POA: Diagnosis present

## 2022-09-19 DIAGNOSIS — Z885 Allergy status to narcotic agent status: Secondary | ICD-10-CM

## 2022-09-19 DIAGNOSIS — K8500 Idiopathic acute pancreatitis without necrosis or infection: Principal | ICD-10-CM

## 2022-09-19 DIAGNOSIS — E1169 Type 2 diabetes mellitus with other specified complication: Secondary | ICD-10-CM | POA: Diagnosis present

## 2022-09-19 LAB — CBC WITH DIFFERENTIAL/PLATELET
Abs Immature Granulocytes: 0.04 10*3/uL (ref 0.00–0.07)
Basophils Absolute: 0.1 10*3/uL (ref 0.0–0.1)
Basophils Relative: 1 %
Eosinophils Absolute: 0.2 10*3/uL (ref 0.0–0.5)
Eosinophils Relative: 2 %
HCT: 39.6 % (ref 39.0–52.0)
Hemoglobin: 13.2 g/dL (ref 13.0–17.0)
Immature Granulocytes: 1 %
Lymphocytes Relative: 19 %
Lymphs Abs: 1.6 10*3/uL (ref 0.7–4.0)
MCH: 31.4 pg (ref 26.0–34.0)
MCHC: 33.3 g/dL (ref 30.0–36.0)
MCV: 94.1 fL (ref 80.0–100.0)
Monocytes Absolute: 1 10*3/uL (ref 0.1–1.0)
Monocytes Relative: 11 %
Neutro Abs: 5.9 10*3/uL (ref 1.7–7.7)
Neutrophils Relative %: 66 %
Platelets: 255 10*3/uL (ref 150–400)
RBC: 4.21 MIL/uL — ABNORMAL LOW (ref 4.22–5.81)
RDW: 15.4 % (ref 11.5–15.5)
WBC: 8.8 10*3/uL (ref 4.0–10.5)
nRBC: 0 % (ref 0.0–0.2)

## 2022-09-19 LAB — COMPREHENSIVE METABOLIC PANEL
ALT: 16 U/L (ref 0–44)
AST: 32 U/L (ref 15–41)
Albumin: 3.3 g/dL — ABNORMAL LOW (ref 3.5–5.0)
Alkaline Phosphatase: 103 U/L (ref 38–126)
Anion gap: 24 — ABNORMAL HIGH (ref 5–15)
BUN: 73 mg/dL — ABNORMAL HIGH (ref 8–23)
CO2: 17 mmol/L — ABNORMAL LOW (ref 22–32)
Calcium: 8.8 mg/dL — ABNORMAL LOW (ref 8.9–10.3)
Chloride: 96 mmol/L — ABNORMAL LOW (ref 98–111)
Creatinine, Ser: 15.11 mg/dL — ABNORMAL HIGH (ref 0.61–1.24)
GFR, Estimated: 3 mL/min — ABNORMAL LOW (ref >60)
Glucose, Bld: 138 mg/dL — ABNORMAL HIGH (ref 70–99)
Potassium: 5.5 mmol/L — ABNORMAL HIGH (ref 3.5–5.1)
Sodium: 137 mmol/L (ref 135–145)
Total Bilirubin: 0.7 mg/dL (ref 0.3–1.2)
Total Protein: 7.5 g/dL (ref 6.5–8.1)

## 2022-09-19 LAB — LIPASE, BLOOD: Lipase: 507 U/L — ABNORMAL HIGH (ref 11–51)

## 2022-09-19 MED ORDER — ONDANSETRON HCL 4 MG/2ML IJ SOLN
4.0000 mg | Freq: Once | INTRAMUSCULAR | Status: AC
  Administered 2022-09-19: 4 mg via INTRAVENOUS
  Filled 2022-09-19: qty 2

## 2022-09-19 MED ORDER — SODIUM CHLORIDE 0.9 % IV BOLUS
250.0000 mL | Freq: Once | INTRAVENOUS | Status: AC
  Administered 2022-09-19: 250 mL via INTRAVENOUS

## 2022-09-19 MED ORDER — FENTANYL CITRATE PF 50 MCG/ML IJ SOSY
50.0000 ug | PREFILLED_SYRINGE | Freq: Once | INTRAMUSCULAR | Status: AC
  Administered 2022-09-19: 50 ug via INTRAVENOUS
  Filled 2022-09-19: qty 1

## 2022-09-19 NOTE — ED Notes (Signed)
Patient reminded of the need for a urine sample at this time 

## 2022-09-19 NOTE — ED Notes (Signed)
Dr Tamera Punt made aware that the labs collected have clotted and IV team placement has been consulted

## 2022-09-19 NOTE — ED Notes (Signed)
Patient transported to CT 

## 2022-09-19 NOTE — ED Triage Notes (Signed)
Patient coming from [facility] with 3 days of abdominal pain with nausea vomiting diarrhea. VSS. EMS reports that they called today because his pain has progressively gotten worse

## 2022-09-19 NOTE — ED Provider Notes (Signed)
[facility] EMERGENCY DEPARTMENT Provider Note   CSN: [id number] Arrival date & time: 09/19/22  2112     History  Chief Complaint  Patient presents with   Abdominal Pain    Richard [name] is a 66 y.o. male.  Patient is a 66 year old male who presents with abdominal pain.  He has a history of end-stage renal disease on dialysis Tuesday Thursday Saturday.  He says he completed the dialysis yesterday.  He also has a history of CVA with right-sided weakness, HIV, diabetes, seizures, hypertension.  He was recently admitted earlier this month for hypertensive emergency with respiratory failure with hypoxia.  He lives in a facility.  He states he has had abdominal pain for about 2 to 3 days.  he had associated vomiting and diarrhea.  His emesis is nonbloody nonbilious.  His diarrhea is watery and nonbloody.  He attributes this to eating a [employer] hamburger.  He has diffuse abdominal pain.  He does report the has had some fevers at the facility but he does not know what they were.  No cough or cold symptoms.       Home Medications Prior to Admission medications   Medication Sig Start Date End Date Taking? Authorizing Provider  ACCU-CHEK AVIVA PLUS test strip USE 1 strip TO test blood sugar 2 TIMES DAILY TO 3 TIMES DAILY 06/10/19   [name], MD  acetaminophen (TYLENOL) 325 MG tablet Take 650 mg by mouth every 6 (six) hours as needed for moderate pain, fever or mild pain.    [provider]  b complex-vitamin c-folic acid (NEPHRO-VITE) 0.8 MG TABS tablet Take 1 tablet by mouth daily.    [provider]  blood glucose meter kit and supplies KIT Dispense based on patient and insurance preference. Check blood sugar 4 times a day 06/11/18   [name], MD  calcium acetate (PHOSLO) 667 MG capsule Take 2,001 mg by mouth 3 (three) times daily with meals.    [provider]  carvedilol (COREG) 12.5 MG tablet Take 12.5 mg by mouth  2 (two) times daily.    [provider]  cloNIDine (CATAPRES) 0.1 MG tablet Take 1 tablet (0.1 mg total) by mouth 3 (three) times daily. 02/21/21   Hosie Poisson, MD  clopidogrel (PLAVIX) 75 MG tablet Take 1 tablet (75 mg total) by mouth daily. 03/07/20   [name], MD  docusate sodium (COLACE) 100 MG capsule Take 100 mg by mouth daily.    [provider]  dolutegravir-lamiVUDine (DOVATO) 50-300 MG tablet Take 1 tablet by mouth daily. 03/19/22   Truman Hayward, MD  doxepin (SINEQUAN) 75 MG capsule Take 75 mg by mouth at bedtime.    [provider]  epoetin alfa-epbx (RETACRIT) 56314 UNIT/ML injection Inject 20,000 Units into the skin every Monday.    [provider]  Ergocalciferol 50 MCG (2000 UT) TABS Take 2,000 Units by mouth daily.    [provider]  hydrALAZINE (APRESOLINE) 10 MG tablet Take 10 mg by mouth 3 (three) times daily.    [provider]  hydrocortisone cream 1 % Apply 1 Application topically every 6 (six) hours as needed for itching. Apply to affected areas on back arms    [provider]  insulin glargine-yfgn (SEMGLEE) 100 UNIT/ML Pen Inject 5 Units into the skin at bedtime.    [provider]  Insulin Pen Needle (SURE COMFORT PEN NEEDLES) 31G X 8 MM MISC Use as directed  twice daily with Novolog flex pen 02/10/19   Koberlein, [name], MD  levETIRAcetam (KEPPRA) 500 MG tablet Take 500 mg by mouth 2 (two) times daily.    [provider]  Lidocaine 3 % CREA Apply 1 application topically See admin instructions. Apply 1 times a day on Tuesday, Thursday and Saturday prior to dialysis    [provider]  Misc. Devices (COMMODE 3-IN-1) MISC Use as directed 02/24/20   [name], MD  Misc. Devices (TRANSFER BENCH) MISC Use as directed 02/24/20   Koberlein, [name], MD  Nutritional Supplements (FEEDING SUPPLEMENT, NEPRO CARB STEADY,) LIQD Take 237 mLs by mouth 3 (three) times daily  between meals.    [provider]  ondansetron (ZOFRAN) 4 MG tablet Take 4 mg by mouth every 8 (eight) hours as needed for vomiting or nausea. 12/09/21   [provider]  pantoprazole (PROTONIX) 40 MG tablet TAKE 1 TABLET BY MOUTH EVERY DAY Patient taking differently: Take 40 mg by mouth daily. 01/23/20   Koberlein, [name], MD  rosuvastatin (CRESTOR) 5 MG tablet Take 5 mg by mouth daily. 12/03/21   [provider]  tamsulosin (FLOMAX) 0.4 MG CAPS capsule TAKE 1 CAPSULE BY MOUTH EVERY DAY Patient taking differently: Take 0.4 mg by mouth daily. 03/28/20   [name], MD  triamcinolone cream (KENALOG) 0.1 % Apply 1 Application topically in the morning and at bedtime. Apply to back and arms for rash twice a day 08/07/22   [provider]  Beecher X 5/16" 1 ML MISC Use pen needle with insulin 2 times daily 03/31/19   [name], MD      Allergies    Oxycodone    Review of Systems   Review of Systems  Constitutional:  Positive for fever. Negative for chills, diaphoresis and fatigue.  HENT:  Negative for congestion, rhinorrhea and sneezing.   Eyes: Negative.   Respiratory:  Negative for cough, chest tightness and shortness of breath.   Cardiovascular:  Negative for chest pain and leg swelling.  Gastrointestinal:  Positive for abdominal pain, diarrhea, nausea and vomiting. Negative for blood in stool.  Genitourinary:  Negative for difficulty urinating, flank pain, frequency and hematuria.  Musculoskeletal:  Negative for arthralgias and back pain.  Skin:  Negative for rash.  Neurological:  Negative for dizziness, speech difficulty, weakness, numbness and headaches.    Physical Exam Updated Vital Signs BP (!) 210/92   Pulse 91   Temp 98.7 F (37.1 C)   Resp (!) 26   SpO2 100%  Physical Exam Constitutional:      Appearance: He is well-developed.  HENT:     Head: Normocephalic and atraumatic.  Eyes:     Pupils: Pupils  are equal, round, and reactive to light.  Cardiovascular:     Rate and Rhythm: Normal rate and regular rhythm.     Heart sounds: Normal heart sounds.  Pulmonary:     Effort: Pulmonary effort is normal. No respiratory distress.     Breath sounds: Normal breath sounds. No wheezing or rales.  Chest:     Chest wall: No tenderness.  Abdominal:     General: Bowel sounds are normal.     Palpations: Abdomen is soft.     Tenderness: There is generalized abdominal tenderness. There is no guarding or rebound.  Musculoskeletal:        General: Normal range of motion.     Cervical back: Normal range of motion and  neck supple.  Lymphadenopathy:     Cervical: No cervical adenopathy.  Skin:    General: Skin is warm and dry.     Findings: No rash.  Neurological:     Mental Status: He is alert and oriented to person, place, and time.     ED Results / Procedures / Treatments   Labs (all labs ordered are listed, but only abnormal results are displayed) Labs Reviewed  LIPASE, BLOOD - Abnormal; Notable for the following components:      Result Value   Lipase 507 (*)    All other components within normal limits  CBC WITH DIFFERENTIAL/PLATELET - Abnormal; Notable for the following components:   RBC 4.21 (*)    All other components within normal limits  COMPREHENSIVE METABOLIC PANEL  CBC WITH DIFFERENTIAL/PLATELET  URINALYSIS, ROUTINE W REFLEX MICROSCOPIC    EKG None  Radiology No results found.  Procedures Procedures    Medications Ordered in ED Medications  ondansetron (ZOFRAN) injection 4 mg (4 mg Intravenous Given 09/19/22 2304)  fentaNYL (SUBLIMAZE) injection 50 mcg (50 mcg Intravenous Given 09/19/22 2305)  sodium chloride 0.9 % bolus 250 mL (250 mLs Intravenous New Bag/Given 09/19/22 2307)    ED Course/ Medical Decision Making/ A&P                           Medical Decision Making Amount and/or Complexity of Data Reviewed Labs: ordered. Radiology:  ordered.  Risk Prescription drug management.   Patient presents with abdominal pain associate with nausea vomiting and diarrhea.  No fever.  His CBC is nonconcerning.  Lipase is elevated at 507.  This pain is improved after treatment in the ED.  He is waiting for the remainder of his labs and abdominal CT.  Dr. Leonette Monarch to take over care pending labs/CT/reevaluation.  Final Clinical Impression(s) / ED Diagnoses Final diagnoses:  None    Rx / DC Orders ED Discharge Orders     None         Malvin Johns, MD 09/19/22 2315

## 2022-09-19 NOTE — ED Notes (Signed)
Lisa phlebotomist notified of the patient's difficult vasculature. Richard [name] verbalizes understanding of the need for lab work

## 2022-09-20 DIAGNOSIS — K219 Gastro-esophageal reflux disease without esophagitis: Secondary | ICD-10-CM | POA: Diagnosis present

## 2022-09-20 DIAGNOSIS — E1169 Type 2 diabetes mellitus with other specified complication: Secondary | ICD-10-CM | POA: Diagnosis not present

## 2022-09-20 DIAGNOSIS — I6930 Unspecified sequelae of cerebral infarction: Secondary | ICD-10-CM

## 2022-09-20 DIAGNOSIS — K2100 Gastro-esophageal reflux disease with esophagitis, without bleeding: Secondary | ICD-10-CM

## 2022-09-20 DIAGNOSIS — I10 Essential (primary) hypertension: Secondary | ICD-10-CM

## 2022-09-20 DIAGNOSIS — G40909 Epilepsy, unspecified, not intractable, without status epilepticus: Secondary | ICD-10-CM | POA: Diagnosis not present

## 2022-09-20 DIAGNOSIS — K853 Drug induced acute pancreatitis without necrosis or infection: Secondary | ICD-10-CM

## 2022-09-20 DIAGNOSIS — E8729 Other acidosis: Secondary | ICD-10-CM | POA: Diagnosis not present

## 2022-09-20 DIAGNOSIS — N186 End stage renal disease: Secondary | ICD-10-CM | POA: Diagnosis present

## 2022-09-20 DIAGNOSIS — N250 Renal osteodystrophy: Secondary | ICD-10-CM | POA: Diagnosis not present

## 2022-09-20 DIAGNOSIS — Z8616 Personal history of COVID-19: Secondary | ICD-10-CM | POA: Diagnosis not present

## 2022-09-20 DIAGNOSIS — R4781 Slurred speech: Secondary | ICD-10-CM | POA: Diagnosis present

## 2022-09-20 DIAGNOSIS — E785 Hyperlipidemia, unspecified: Secondary | ICD-10-CM

## 2022-09-20 DIAGNOSIS — E875 Hyperkalemia: Secondary | ICD-10-CM | POA: Diagnosis present

## 2022-09-20 DIAGNOSIS — K859 Acute pancreatitis without necrosis or infection, unspecified: Secondary | ICD-10-CM | POA: Diagnosis present

## 2022-09-20 DIAGNOSIS — E1122 Type 2 diabetes mellitus with diabetic chronic kidney disease: Secondary | ICD-10-CM | POA: Diagnosis present

## 2022-09-20 DIAGNOSIS — I959 Hypotension, unspecified: Secondary | ICD-10-CM | POA: Diagnosis not present

## 2022-09-20 DIAGNOSIS — Z79899 Other long term (current) drug therapy: Secondary | ICD-10-CM | POA: Diagnosis not present

## 2022-09-20 DIAGNOSIS — I69351 Hemiplegia and hemiparesis following cerebral infarction affecting right dominant side: Secondary | ICD-10-CM | POA: Diagnosis not present

## 2022-09-20 DIAGNOSIS — R471 Dysarthria and anarthria: Secondary | ICD-10-CM | POA: Diagnosis present

## 2022-09-20 DIAGNOSIS — E11649 Type 2 diabetes mellitus with hypoglycemia without coma: Secondary | ICD-10-CM | POA: Diagnosis not present

## 2022-09-20 DIAGNOSIS — B20 Human immunodeficiency virus [HIV] disease: Secondary | ICD-10-CM | POA: Diagnosis not present

## 2022-09-20 DIAGNOSIS — M898X9 Other specified disorders of bone, unspecified site: Secondary | ICD-10-CM | POA: Diagnosis present

## 2022-09-20 DIAGNOSIS — F17210 Nicotine dependence, cigarettes, uncomplicated: Secondary | ICD-10-CM | POA: Diagnosis present

## 2022-09-20 DIAGNOSIS — Z992 Dependence on renal dialysis: Secondary | ICD-10-CM | POA: Diagnosis not present

## 2022-09-20 DIAGNOSIS — D631 Anemia in chronic kidney disease: Secondary | ICD-10-CM | POA: Diagnosis present

## 2022-09-20 DIAGNOSIS — R748 Abnormal levels of other serum enzymes: Secondary | ICD-10-CM | POA: Diagnosis present

## 2022-09-20 DIAGNOSIS — R112 Nausea with vomiting, unspecified: Secondary | ICD-10-CM | POA: Diagnosis not present

## 2022-09-20 DIAGNOSIS — E872 Acidosis, unspecified: Secondary | ICD-10-CM | POA: Diagnosis present

## 2022-09-20 DIAGNOSIS — R109 Unspecified abdominal pain: Secondary | ICD-10-CM | POA: Diagnosis not present

## 2022-09-20 DIAGNOSIS — E1140 Type 2 diabetes mellitus with diabetic neuropathy, unspecified: Secondary | ICD-10-CM | POA: Diagnosis present

## 2022-09-20 DIAGNOSIS — I120 Hypertensive chronic kidney disease with stage 5 chronic kidney disease or end stage renal disease: Secondary | ICD-10-CM | POA: Diagnosis present

## 2022-09-20 LAB — CBG MONITORING, ED
Glucose-Capillary: 73 mg/dL (ref 70–99)
Glucose-Capillary: 49 mg/dL — ABNORMAL LOW (ref 70–99)
Glucose-Capillary: 138 mg/dL — ABNORMAL HIGH (ref 70–99)

## 2022-09-20 LAB — LIPID PANEL
Cholesterol: 158 mg/dL (ref 0–200)
HDL: 42 mg/dL (ref >40)
LDL Cholesterol: 82 mg/dL (ref 0–99)
Total CHOL/HDL Ratio: 3.8 RATIO
Triglycerides: 169 mg/dL — ABNORMAL HIGH (ref <150)
VLDL: 34 mg/dL (ref 0–40)

## 2022-09-20 LAB — GLUCOSE, CAPILLARY
Glucose-Capillary: 80 mg/dL (ref 70–99)
Glucose-Capillary: 70 mg/dL (ref 70–99)

## 2022-09-20 LAB — HEMOGLOBIN A1C
Hgb A1c MFr Bld: 6.5 % — ABNORMAL HIGH (ref 4.8–5.6)
Mean Plasma Glucose: 139.85 mg/dL

## 2022-09-20 LAB — PHOSPHORUS: Phosphorus: 10.2 mg/dL — ABNORMAL HIGH (ref 2.5–4.6)

## 2022-09-20 LAB — LACTIC ACID, PLASMA: Lactic Acid, Venous: 0.8 mmol/L (ref 0.5–1.9)

## 2022-09-20 LAB — BETA-HYDROXYBUTYRIC ACID: Beta-Hydroxybutyric Acid: 0.56 mmol/L — ABNORMAL HIGH (ref 0.05–0.27)

## 2022-09-20 MED ORDER — HYDROMORPHONE HCL 1 MG/ML IJ SOLN
1.0000 mg | Freq: Once | INTRAMUSCULAR | Status: AC
  Administered 2022-09-20: 1 mg via INTRAVENOUS
  Filled 2022-09-20 (×2): qty 1

## 2022-09-20 MED ORDER — CHLORHEXIDINE GLUCONATE CLOTH 2 % EX PADS
6.0000 | MEDICATED_PAD | Freq: Every day | CUTANEOUS | Status: DC
  Administered 2022-09-23 – 2022-09-25 (×3): 6 via TOPICAL

## 2022-09-20 MED ORDER — HYDROMORPHONE HCL 1 MG/ML IJ SOLN
0.5000 mg | INTRAMUSCULAR | Status: DC | PRN
  Administered 2022-09-20 – 2022-09-21 (×2): 0.5 mg via INTRAVENOUS
  Filled 2022-09-20 (×2): qty 0.5

## 2022-09-20 MED ORDER — BISACODYL 10 MG RE SUPP
10.0000 mg | Freq: Once | RECTAL | Status: AC
  Administered 2022-09-20: 10 mg via RECTAL
  Filled 2022-09-20: qty 1

## 2022-09-20 MED ORDER — DOXERCALCIFEROL 4 MCG/2ML IV SOLN
3.0000 ug | INTRAVENOUS | Status: DC
  Administered 2022-09-23 – 2022-09-25 (×2): 3 ug via INTRAVENOUS
  Filled 2022-09-20 (×3): qty 2

## 2022-09-20 MED ORDER — HEPARIN SODIUM (PORCINE) 5000 UNIT/ML IJ SOLN
5000.0000 [IU] | Freq: Three times a day (TID) | INTRAMUSCULAR | Status: DC
  Administered 2022-09-21 – 2022-09-26 (×12): 5000 [IU] via SUBCUTANEOUS
  Filled 2022-09-20 (×16): qty 1

## 2022-09-20 MED ORDER — BICTEGRAVIR-EMTRICITAB-TENOFOV 50-200-25 MG PO TABS: 1.0000 | ORAL_TABLET | Freq: Every day | ORAL | Status: DC

## 2022-09-20 MED ORDER — SODIUM ZIRCONIUM CYCLOSILICATE 5 G PO PACK
5.0000 g | PACK | Freq: Once | ORAL | Status: AC
  Administered 2022-09-20: 5 g via ORAL
  Filled 2022-09-20: qty 1

## 2022-09-20 MED ORDER — TAMSULOSIN HCL 0.4 MG PO CAPS
0.4000 mg | ORAL_CAPSULE | Freq: Every day | ORAL | Status: DC
  Administered 2022-09-20 – 2022-09-26 (×5): 0.4 mg via ORAL
  Filled 2022-09-20 (×6): qty 1

## 2022-09-20 MED ORDER — DEXTROSE 50 % IV SOLN
INTRAVENOUS | Status: AC
  Administered 2022-09-20: 50 mL
  Filled 2022-09-20: qty 50

## 2022-09-20 MED ORDER — PANTOPRAZOLE SODIUM 40 MG PO TBEC
40.0000 mg | DELAYED_RELEASE_TABLET | Freq: Every day | ORAL | Status: DC
  Administered 2022-09-20 – 2022-09-26 (×5): 40 mg via ORAL
  Filled 2022-09-20 (×6): qty 1

## 2022-09-20 MED ORDER — DOXEPIN HCL 25 MG PO CAPS
75.0000 mg | ORAL_CAPSULE | Freq: Every day | ORAL | Status: DC
  Administered 2022-09-21 – 2022-09-25 (×5): 75 mg via ORAL
  Filled 2022-09-20: qty 1
  Filled 2022-09-20 (×7): qty 3

## 2022-09-20 MED ORDER — CHLORHEXIDINE GLUCONATE CLOTH 2 % EX PADS: 6.0000 | MEDICATED_PAD | Freq: Every day | CUTANEOUS | Status: DC

## 2022-09-20 MED ORDER — HYDRALAZINE HCL 10 MG PO TABS
10.0000 mg | ORAL_TABLET | Freq: Three times a day (TID) | ORAL | Status: DC
  Administered 2022-09-20 – 2022-09-26 (×13): 10 mg via ORAL
  Filled 2022-09-20 (×16): qty 1

## 2022-09-20 MED ORDER — LEVETIRACETAM 500 MG PO TABS
500.0000 mg | ORAL_TABLET | Freq: Two times a day (BID) | ORAL | Status: DC
  Administered 2022-09-20 – 2022-09-26 (×10): 500 mg via ORAL
  Filled 2022-09-20 (×13): qty 1

## 2022-09-20 MED ORDER — INSULIN ASPART 100 UNIT/ML IJ SOLN
0.0000 [IU] | INTRAMUSCULAR | Status: DC
  Administered 2022-09-24: 2 [IU] via SUBCUTANEOUS

## 2022-09-20 MED ORDER — ACETAMINOPHEN 650 MG RE SUPP: 650.0000 mg | Freq: Four times a day (QID) | RECTAL | Status: DC | PRN

## 2022-09-20 MED ORDER — ACETAMINOPHEN 325 MG PO TABS
650.0000 mg | ORAL_TABLET | Freq: Four times a day (QID) | ORAL | Status: DC | PRN
  Administered 2022-09-22 – 2022-09-24 (×2): 650 mg via ORAL
  Filled 2022-09-20 (×3): qty 2

## 2022-09-20 MED ORDER — ONDANSETRON HCL 4 MG/2ML IJ SOLN: 4.0000 mg | Freq: Four times a day (QID) | INTRAMUSCULAR | Status: DC | PRN

## 2022-09-20 MED ORDER — ONDANSETRON HCL 4 MG PO TABS: 4.0000 mg | ORAL_TABLET | Freq: Four times a day (QID) | ORAL | Status: DC | PRN

## 2022-09-20 MED ORDER — HYDROMORPHONE HCL 1 MG/ML IJ SOLN
1.0000 mg | Freq: Once | INTRAMUSCULAR | Status: AC
  Administered 2022-09-20: 1 mg via INTRAVENOUS
  Filled 2022-09-20: qty 1

## 2022-09-20 MED ORDER — CLONIDINE HCL 0.1 MG PO TABS
0.1000 mg | ORAL_TABLET | Freq: Three times a day (TID) | ORAL | Status: DC
  Administered 2022-09-20 – 2022-09-24 (×8): 0.1 mg via ORAL
  Filled 2022-09-20 (×10): qty 1

## 2022-09-20 MED ORDER — CLOPIDOGREL BISULFATE 75 MG PO TABS
75.0000 mg | ORAL_TABLET | Freq: Every day | ORAL | Status: DC
  Administered 2022-09-20 – 2022-09-26 (×6): 75 mg via ORAL
  Filled 2022-09-20 (×6): qty 1

## 2022-09-20 MED ORDER — BICTEGRAVIR-EMTRICITAB-TENOFOV 50-200-25 MG PO TABS
1.0000 | ORAL_TABLET | Freq: Every day | ORAL | Status: DC
  Administered 2022-09-20 – 2022-09-26 (×6): 1 via ORAL
  Filled 2022-09-20 (×8): qty 1

## 2022-09-20 MED ORDER — CARVEDILOL 12.5 MG PO TABS
12.5000 mg | ORAL_TABLET | Freq: Two times a day (BID) | ORAL | Status: DC
  Administered 2022-09-20 – 2022-09-25 (×5): 12.5 mg via ORAL
  Filled 2022-09-20 (×8): qty 1

## 2022-09-20 MED ORDER — ROSUVASTATIN CALCIUM 5 MG PO TABS
5.0000 mg | ORAL_TABLET | Freq: Every day | ORAL | Status: DC
  Administered 2022-09-20 – 2022-09-26 (×5): 5 mg via ORAL
  Filled 2022-09-20 (×6): qty 1

## 2022-09-20 NOTE — Assessment & Plan Note (Addendum)
Pt with h/o HIV/AIDS Holding Dovato due to concern for possible Lamivudine induced pancreatitis (known side effect) Call ID in AM to get him on different HIV med (will send a message to on coming provider). Ordering Biktarvy instead per Dr. Alcario Drought rec, okay to use despite ESRD per him.

## 2022-09-20 NOTE — Progress Notes (Signed)
Informed patient of dialysis tonight and attempted to get informed consent signed. Pt stated he will sign it later on.

## 2022-09-20 NOTE — Progress Notes (Signed)
   Patient seen and examined at bedside, patient admitted after midnight, please see earlier detailed admission note by Etta Quill, DO. Briefly, patient presented secondary to abdominal pain and found to have a lipase of 507, concerning for acute pancreatitis. Patient made NPO on admission.  Subjective: Continued abdominal pain. No other concerns.  Objective: BP (!) 156/80   Pulse 76   Temp 97.6 F (36.4 C) (Oral)   Resp 16   SpO2 100%   General exam: Appears calm and comfortable Respiratory system: Clear to auscultation. Respiratory effort normal. Cardiovascular system: S1 & S2 heard, RRR. Gastrointestinal system: Abdomen is non-distended, soft and generally mildly tender. Normal bowel sounds heard. Central nervous system: Alert and oriented.Right sided hemiplegia. Dysarthria. Musculoskeletal: No calf tenderness. Right hand/arm contracture   Brief assessment/Plan:  Acute pancreatitis Abdominal pain with associated lipase of 507. CT negative for correlative findings. Recurrent issue. Patient is on Dovato which may be contributory. -Advance to clear liquid diet -Analgesics PRN  High anion gap acidosis Secondary to renal failure. -Management with HD  ESRD Nephrology consulted for hemodialysis while inpatient.  Primary hypertension Uncontrolled. Likely needs hemodialysis. Asymptomatic. -Continue clonidine, hydralazine and Coreg -Hemodialysis for continued management of blood pressure  History CVA Noted. Chronic right hemiparesis.  HIV disease History of AIDS. Most recent CD4 count of 357 from 08/2022. Patient is on Dovato as an outpatient which was switched back to Ger[facility] secondary to concern that Dovato could possibly be contributing to pancreatitis. -Continue Biktarvy  Seizure disorder -Continue Keppra 500 mg BID  Diabetes mellitus, type 2 Controlled. Hemoglobin A1C of 6.5%. -Continue SSI  GERD -Continue Protonix  Hyperlipidemia -Continue  Crestor   Family communication: None at bedside DVT prophylaxis: Heparin Disposition: Discharge home possible in 24 hours if diet advanced successfully, improvement of abdominal pain  Cordelia Poche, MD [facility] 09/20/2022, 11:14 AM

## 2022-09-20 NOTE — ED Notes (Signed)
Requested ordered biktarvy from pharmacy.

## 2022-09-20 NOTE — ED Provider Notes (Signed)
I assumed care of this patient.  Please see previous provider note for further details of Hx, PE.  Briefly patient is a 66 y.o. male who presented abd pain N/V/D. Work up notable for elevated lipase. Pending CT  CT reassuring w/o SBO, or obvious inflammatory/infectious process.  No gallstone. Pancreatitis likely medicine related from [place], but will need admission for further work up and management.       Fatima Blank, MD 09/20/22 [phone]

## 2022-09-20 NOTE — H&P (Addendum)
History and Physical    Patient: Richard [name] NWG:[id number] DOB: Sep 28, 1955 DOA: 09/19/2022 DOS: the patient was seen and examined on 09/20/2022 PCP: Jodi Marble, MD  Patient coming from: SNF  Chief Complaint:  Chief Complaint  Patient presents with   Abdominal Pain   HPI: Richard [name] is a 66 y.o. male with medical history significant of ESRD on TTS dialysis, says last did dialysis yesterday.  H/o stroke with chronic R sided weakness and speech difficulty.  HIV on HAART, DM, Seizures, and HTN.  Admitted earlier this month for HTN urgency with respiratory failure and hypoxia.  Lives in facility at baseline.  Pt in to ED today with c/o 2-3 days of abd pain, N/V and watery diarrhea.  Emesis is NBNB.  Diarrhea also NB.  Thinks it might be a bad [employer] hamburger he ate.  Abd pain diffuse.  No cough, no URI symptoms.  Reports fever at facility but doesn't know temperature.   Review of Systems: As mentioned in the history of present illness. All other systems reviewed and are negative. Past Medical History:  Diagnosis Date   COVID-19 05/16/2020   Diabetes mellitus, type II, insulin dependent ([place])    ESRD on hemodialysis ([place]) 08/30/2021   GERD (gastroesophageal reflux disease)    Hematoma 03/19/2022   HIV (human immunodeficiency virus infection) ([place])    Hypertension    Meningoencephalitis    Neuropathy in diabetes ([place])    bilat feet   Recurrent falls    possible small SAH right frontal lobe 12/19/21 (Plavix held x 1 week); left transverse foramen C3 fracture 11/04/21   Ruptured lumbar disc    Seizure disorder ([place])    Stroke ([place]) 2013   residual right sided weakness (arm>leg)   Past Surgical History:  Procedure Laterality Date   AV FISTULA PLACEMENT Left 04/25/2020   Procedure: LEFT ARM BRACHIOCEPHALIC ARTERIOVENOUS (AV) FISTULA CREATION;  Surgeon: Rosetta Posner, MD;  [facility];  Service: Vascular;  Laterality: Left;   AV FISTULA  PLACEMENT Left 02/28/2022   Procedure: LEFT ARM INSERTION OF ARTERIOVENOUS (AV) GORE-TEX GRAFT ARM;  Surgeon: Marty Heck, MD;  Location: [facility];  Service: Vascular;  Laterality: Left;   IR FLUORO GUIDE CV LINE RIGHT  04/13/2020   IR FLUORO GUIDE CV LINE RIGHT  01/31/2022   IR REMOVAL TUN CV CATH W/O FL  01/14/2021   IR US GUIDE VASC ACCESS RIGHT  04/13/2020   IR US GUIDE VASC ACCESS RIGHT  01/31/2022   IR US GUIDE VASC ACCESS RIGHT  01/31/2022   KNEE ARTHROSCOPY Bilateral    Social History:  reports that he has been smoking cigarettes. He has a 17.50 pack-year smoking history. He has been exposed to tobacco smoke. He has never used smokeless tobacco. He reports that he does not currently use drugs after having used the following drugs: Marijuana. He reports that he does not drink alcohol.  Allergies  Allergen Reactions   Oxycodone Nausea And Vomiting    NOT DOCUMENTED ON MAR    Family History  Problem Relation Age of Onset   Other Mother    Stroke Father    Hypertension Father    Epilepsy Father    Breast cancer Sister    Stroke Brother    Heart attack Brother    Diabetes Sister     Prior to Admission medications   Medication Sig Start Date End Date Taking? Authorizing Provider  ACCU-CHEK AVIVA PLUS test strip USE 1 strip  TO test blood sugar 2 TIMES DAILY TO 3 TIMES DAILY 06/10/19   [name], MD  acetaminophen (TYLENOL) 325 MG tablet Take 650 mg by mouth every 6 (six) hours as needed for moderate pain, fever or mild pain.    [provider]  b complex-vitamin c-folic acid (NEPHRO-VITE) 0.8 MG TABS tablet Take 1 tablet by mouth daily.    [provider]  blood glucose meter kit and supplies KIT Dispense based on patient and insurance preference. Check blood sugar 4 times a day 06/11/18   [name], MD  calcium acetate (PHOSLO) 667 MG capsule Take 2,001 mg by mouth 3 (three) times daily with meals.    [provider]  carvedilol  (COREG) 12.5 MG tablet Take 12.5 mg by mouth 2 (two) times daily.    [provider]  cloNIDine (CATAPRES) 0.1 MG tablet Take 1 tablet (0.1 mg total) by mouth 3 (three) times daily. 02/21/21   Hosie Poisson, MD  clopidogrel (PLAVIX) 75 MG tablet Take 1 tablet (75 mg total) by mouth daily. 03/07/20   [name], MD  docusate sodium (COLACE) 100 MG capsule Take 100 mg by mouth daily.    [provider]  dolutegravir-lamiVUDine (DOVATO) 50-300 MG tablet Take 1 tablet by mouth daily. 03/19/22   Truman Hayward, MD  doxepin (SINEQUAN) 75 MG capsule Take 75 mg by mouth at bedtime.    [provider]  epoetin alfa-epbx (RETACRIT) 86767 UNIT/ML injection Inject 20,000 Units into the skin every Monday.    [provider]  Ergocalciferol 50 MCG (2000 UT) TABS Take 2,000 Units by mouth daily.    [provider]  hydrALAZINE (APRESOLINE) 10 MG tablet Take 10 mg by mouth 3 (three) times daily.    [provider]  hydrocortisone cream 1 % Apply 1 Application topically every 6 (six) hours as needed for itching. Apply to affected areas on back arms    [provider]  insulin glargine-yfgn (SEMGLEE) 100 UNIT/ML Pen Inject 5 Units into the skin at bedtime.    [provider]  Insulin Pen Needle (SURE COMFORT PEN NEEDLES) 31G X 8 MM MISC Use as directed twice daily with Novolog flex pen 02/10/19   Koberlein, Junell C, MD  levETIRAcetam (KEPPRA) 500 MG tablet Take 500 mg by mouth 2 (two) times daily.    [provider]  Lidocaine 3 % CREA Apply 1 application topically See admin instructions. Apply 1 times a day on Tuesday, Thursday and Saturday prior to dialysis    [provider]  Misc. Devices (COMMODE 3-IN-1) MISC Use as directed 02/24/20   [name], MD  Misc. Devices (TRANSFER BENCH) MISC Use as directed 02/24/20   Koberlein, [name], MD  Nutritional Supplements (FEEDING SUPPLEMENT, NEPRO CARB STEADY,) LIQD  Take 237 mLs by mouth 3 (three) times daily between meals.    [provider]  ondansetron (ZOFRAN) 4 MG tablet Take 4 mg by mouth every 8 (eight) hours as needed for vomiting or nausea. 12/09/21   [provider]  pantoprazole (PROTONIX) 40 MG tablet TAKE 1 TABLET BY MOUTH EVERY DAY Patient taking differently: Take 40 mg by mouth daily. 01/23/20   Koberlein, [name], MD  rosuvastatin (CRESTOR) 5 MG tablet Take 5 mg by mouth daily. 12/03/21   [provider]  tamsulosin (FLOMAX) 0.4 MG CAPS capsule TAKE 1 CAPSULE BY MOUTH EVERY DAY Patient taking differently: Take 0.4 mg by mouth daily. 03/28/20  Koberlein, [name], MD  triamcinolone cream (KENALOG) 0.1 % Apply 1 Application topically in the morning and at bedtime. Apply to back and arms for rash twice a day 08/07/22   [provider]  [place] X 5/16" 1 ML MISC Use pen needle with insulin 2 times daily 03/31/19   [name], MD    Physical Exam: Vitals:   09/20/22 0430 09/20/22 0515 09/20/22 0519 09/20/22 0530  BP: (!) 190/88 (!) 206/84  (!) 164/70  Pulse: 89 89  82  Resp: 20   18  Temp:   98.5 F (36.9 C)   TempSrc:   Oral   SpO2: 98% 100%  100%   Constitutional: NAD, calm, comfortable Eyes: PERRL, lids and conjunctivae normal ENMT: Mucous membranes are moist. Posterior pharynx clear of any exudate or lesions.Normal dentition.  Neck: normal, supple, no masses, no thyromegaly Respiratory: clear to auscultation bilaterally, no wheezing, no crackles. Normal respiratory effort. No accessory muscle use.  Cardiovascular: Regular rate and rhythm, no murmurs / rubs / gallops. No extremity edema. 2+ pedal pulses. No carotid bruits.  Abdomen: Generalized TTP, no guarding, no rebound Musculoskeletal: no clubbing / cyanosis. No joint deformity upper and lower extremities. Good ROM, no contractures. Normal muscle tone.  Skin: no rashes, lesions, ulcers. No induration Neurologic: Pt with  aphasia and R sided weakness, slow speech. Psychiatric: Normal judgment and insight. Alert and oriented x 3. Normal mood.   Data Reviewed:       Latest Ref Rng & Units 09/19/2022   10:44 PM 09/06/2022    1:55 PM 09/04/2022    7:49 AM  CBC  WBC 4.0 - 10.5 K/uL 8.8  6.5  6.3   Hemoglobin 13.0 - 17.0 g/dL 13.2  10.1  9.2   Hematocrit 39.0 - 52.0 % 39.6  31.2  28.9   Platelets 150 - 400 K/uL 255  179  182       Latest Ref Rng & Units 09/19/2022    9:40 PM 09/06/2022    1:55 PM 09/04/2022    7:49 AM  CMP  Glucose 70 - 99 mg/dL 138  119  106   BUN 8 - 23 mg/dL 73  39  28   Creatinine 0.61 - 1.24 mg/dL 15.11  8.58  7.37   Sodium 135 - 145 mmol/L 137  139  139   Potassium 3.5 - 5.1 mmol/L 5.5  3.5  3.7   Chloride 98 - 111 mmol/L 96  97  100   CO2 22 - 32 mmol/L _0 Calcium 8.9 - 10.3 mg/dL 8.8  8.9  8.0   Total Protein 6.5 - 8.1 g/dL 7.5     Total Bilirubin 0.3 - 1.2 mg/dL 0.7     Alkaline Phos 38 - 126 U/L 103     AST 15 - 41 U/L 32     ALT 0 - 44 U/L 16      Lipase 501  CT AP: IMPRESSION: 1. Nonobstructing 2-3 mm calculus in the lower pole of the left kidney. No evidence of hydronephrosis or ureteral calculus. 2. Simple cyst in the interpolar region of the right kidney measuring up to 2 cm. 3. Bowel loops are normal in caliber. Normal appendix. No evidence of colitis or diverticulitis. 4. Aortic atherosclerosis.    Assessment and Plan: * Acute pancreatitis N/V/D, epigastric abd pain, and elevated Lipase.  In patient on a medicaiton known to cause acute pancreatitis as a  major side effect. Suspicious for acute pancreatitis despite the negative CT read. Hold Dovato NPO Will defer IVF for the moment to nephrology given ESRD status, very recent admission for fluid overload, etc. Dilaudid PRN pain Zofran PRN nausea Tele monitor  High anion gap metabolic acidosis Suspect much of his AG of 24 is secondary to BUN of 73 today (uremia) Stat lactate ordered Stat  BHB ordered BGL 130 only, no H/O DKA that I can see though.  ESRD (end stage renal disease) on dialysis [facility]) Call nephrology in AM for IP dialysis during stay.  HTN (hypertension) Cont home BP meds  History of cerebrovascular accident (CVA) with residual deficit prior CVA with slurred speech and right hemiparesis at baseline.  -Continue Plavix and statin  AIDS (acquired immune deficiency syndrome) ([place]) Pt with h/o HIV/AIDS Holding Dovato due to concern for possible Lamivudine induced pancreatitis (known side effect) Call ID in AM to get him on different HIV med (will send a message to on coming provider).  Seizure disorder ([place]) Seizure precautions Continue keppra  Type 2 diabetes mellitus with hyperlipidemia ([facility]) SSI very sensitive Q4H  GERD (gastroesophageal reflux disease) Continue protonix  Hyperlipidemia Check FLP, especially with question of acute pancreatitis. Continue crestor      Advance Care Planning:   Code Status: Full Code  Consults: None  Family Communication: No family in room  Severity of Illness: The appropriate patient status for this patient is OBSERVATION. Observation status is judged to be reasonable and necessary in order to provide the required intensity of service to ensure the patient's safety. The patient's presenting symptoms, physical exam findings, and initial radiographic and laboratory data in the context of their medical condition is felt to place them at decreased risk for further clinical deterioration. Furthermore, it is anticipated that the patient will be medically stable for discharge from the hospital within 2 midnights of admission.   Author: Etta Quill., DO 09/20/2022 6:14 AM  For on call review [email or web address].

## 2022-09-20 NOTE — Assessment & Plan Note (Addendum)
Check FLP, especially with question of acute pancreatitis. Continue crestor

## 2022-09-20 NOTE — Assessment & Plan Note (Signed)
prior CVA with slurred speech and right hemiparesis at baseline.  -Continue Plavix and statin

## 2022-09-20 NOTE — Assessment & Plan Note (Signed)
Seizure precautions Continue keppra

## 2022-09-20 NOTE — ED Notes (Signed)
Pt CBG 49, pt on clear liquids, no juice, administered D50 per MD Nette[place]. Caryl Pina RN notified

## 2022-09-20 NOTE — Assessment & Plan Note (Signed)
Cont home BP meds ?

## 2022-09-20 NOTE — Assessment & Plan Note (Signed)
Continue protonix  

## 2022-09-20 NOTE — Assessment & Plan Note (Signed)
Call nephrology in AM for IP dialysis during stay. 

## 2022-09-20 NOTE — Consult Note (Addendum)
Renal Service Consult Note [facility]  Richard [name] 09/20/2022 Richard Blazing, MD Requesting Physician: Dr. Lonny Prude  Reason for Consult: ESRD pt w/ abdominal pain HPI: The patient is a 66 y.o. year-old w/ PMH as below who presented to ED 12/29 c/o 2-3 days of abd pain, N/V and watery diarrhea. Subjective fever at his facility. Lives in a SNF. Hx CVA w/ hemiparesis and some aphasia, also HIV, DM, seizures, esrd. Pt admitted and we are asked to see for dialysis.   Pt seen in ED. No c/o's today.   ROS - denies CP, no joint pain, no HA, no blurry vision, no rash, no diarrhea, no nausea/ vomiting, no dysuria, no difficulty voiding   Past Medical History  Past Medical History:  Diagnosis Date   COVID-19 05/16/2020   Diabetes mellitus, type II, insulin dependent ([place])    ESRD on hemodialysis ([place]) 08/30/2021   GERD (gastroesophageal reflux disease)    Hematoma 03/19/2022   HIV (human immunodeficiency virus infection) ([place])    Hypertension    Meningoencephalitis    Neuropathy in diabetes ([place])    bilat feet   Recurrent falls    possible small SAH right frontal lobe 12/19/21 (Plavix held x 1 week); left transverse foramen C3 fracture 11/04/21   Ruptured lumbar disc    Seizure disorder ([place])    Stroke ([place]) 2013   residual right sided weakness (arm>leg)   Past Surgical History  Past Surgical History:  Procedure Laterality Date   AV FISTULA PLACEMENT Left 04/25/2020   Procedure: LEFT ARM BRACHIOCEPHALIC ARTERIOVENOUS (AV) FISTULA CREATION;  Surgeon: Rosetta Posner, MD;  Location: [facility];  Service: Vascular;  Laterality: Left;   AV FISTULA PLACEMENT Left 02/28/2022   Procedure: LEFT ARM INSERTION OF ARTERIOVENOUS (AV) GORE-TEX GRAFT ARM;  Surgeon: Marty Heck, MD;  [facility];  Service: Vascular;  Laterality: Left;   IR FLUORO GUIDE CV LINE RIGHT  04/13/2020   IR FLUORO GUIDE CV LINE RIGHT  01/31/2022   IR REMOVAL TUN CV CATH W/O FL  01/14/2021   IR US  GUIDE VASC ACCESS RIGHT  04/13/2020   IR US GUIDE VASC ACCESS RIGHT  01/31/2022   IR US GUIDE VASC ACCESS RIGHT  01/31/2022   KNEE ARTHROSCOPY Bilateral    Family History  Family History  Problem Relation Age of Onset   Other Mother    Stroke Father    Hypertension Father    Epilepsy Father    Breast cancer Sister    Stroke Brother    Heart attack Brother    Diabetes Sister    Social History  reports that he has been smoking cigarettes. He has a 17.50 pack-year smoking history. He has been exposed to tobacco smoke. He has never used smokeless tobacco. He reports that he does not currently use drugs after having used the following drugs: Marijuana. He reports that he does not drink alcohol. Allergies  Allergies  Allergen Reactions   Oxycodone Nausea And Vomiting    NOT DOCUMENTED ON MAR   Home medications Prior to Admission medications   Medication Sig Start Date End Date Taking? Authorizing Provider  ACCU-CHEK AVIVA PLUS test strip USE 1 strip TO test blood sugar 2 TIMES DAILY TO 3 TIMES DAILY 06/10/19   [name], MD  acetaminophen (TYLENOL) 325 MG tablet Take 650 mg by mouth every 6 (six) hours as needed for moderate pain, fever or mild pain.    [provider]  b  complex-vitamin c-folic acid (NEPHRO-VITE) 0.8 MG TABS tablet Take 1 tablet by mouth daily.    [provider]  blood glucose meter kit and supplies KIT Dispense based on patient and insurance preference. Check blood sugar 4 times a day 06/11/18   [name], MD  calcium acetate (PHOSLO) 667 MG capsule Take 2,001 mg by mouth 3 (three) times daily with meals.    [provider]  carvedilol (COREG) 12.5 MG tablet Take 12.5 mg by mouth 2 (two) times daily.    [provider]  cloNIDine (CATAPRES) 0.1 MG tablet Take 1 tablet (0.1 mg total) by mouth 3 (three) times daily. 02/21/21   Hosie Poisson, MD  clopidogrel (PLAVIX) 75 MG tablet Take 1 tablet (75 mg total) by mouth daily.  03/07/20   [name], MD  docusate sodium (COLACE) 100 MG capsule Take 100 mg by mouth daily.    [provider]  dolutegravir-lamiVUDine (DOVATO) 50-300 MG tablet Take 1 tablet by mouth daily. 03/19/22   Truman Hayward, MD  doxepin (SINEQUAN) 75 MG capsule Take 75 mg by mouth at bedtime.    [provider]  epoetin alfa-epbx (RETACRIT) 78676 UNIT/ML injection Inject 20,000 Units into the skin every Monday.    [provider]  Ergocalciferol 50 MCG (2000 UT) TABS Take 2,000 Units by mouth daily.    [provider]  hydrALAZINE (APRESOLINE) 10 MG tablet Take 10 mg by mouth 3 (three) times daily.    [provider]  hydrocortisone cream 1 % Apply 1 Application topically every 6 (six) hours as needed for itching. Apply to affected areas on back arms    [provider]  insulin glargine-yfgn (SEMGLEE) 100 UNIT/ML Pen Inject 5 Units into the skin at bedtime.    [provider]  Insulin Pen Needle (SURE COMFORT PEN NEEDLES) 31G X 8 MM MISC Use as directed twice daily with Novolog flex pen 02/10/19   Koberlein, Junell C, MD  levETIRAcetam (KEPPRA) 500 MG tablet Take 500 mg by mouth 2 (two) times daily.    [provider]  Lidocaine 3 % CREA Apply 1 application topically See admin instructions. Apply 1 times a day on Tuesday, Thursday and Saturday prior to dialysis    [provider]  Misc. Devices (COMMODE 3-IN-1) MISC Use as directed 02/24/20   [name], MD  Misc. Devices (TRANSFER BENCH) MISC Use as directed 02/24/20   Koberlein, [name], MD  Nutritional Supplements (FEEDING SUPPLEMENT, NEPRO CARB STEADY,) LIQD Take 237 mLs by mouth 3 (three) times daily between meals.    [provider]  ondansetron (ZOFRAN) 4 MG tablet Take 4 mg by mouth every 8 (eight) hours as needed for vomiting or nausea. 12/09/21   [provider]  pantoprazole (PROTONIX) 40 MG tablet TAKE 1 TABLET BY MOUTH EVERY  DAY Patient taking differently: Take 40 mg by mouth daily. 01/23/20   Koberlein, [name], MD  rosuvastatin (CRESTOR) 5 MG tablet Take 5 mg by mouth daily. 12/03/21   [provider]  tamsulosin (FLOMAX) 0.4 MG CAPS capsule TAKE 1 CAPSULE BY MOUTH EVERY DAY Patient taking differently: Take 0.4 mg by mouth daily. 03/28/20   [name], MD  triamcinolone cream (KENALOG) 0.1 % Apply 1 Application topically in the morning and at bedtime. Apply to back and arms for rash twice a day 08/07/22   [provider]  TRUEPLUS INSULIN SYRINGE 31G X 5/16" 1 ML MISC Use pen needle  with insulin 2 times daily 03/31/19   [name] C, MD     Vitals:   09/20/22 1221 09/20/22 1315 09/20/22 1400 09/20/22 1515  BP:  120/70 (!) 147/68   Pulse:  71 (!) 58   Resp:  _0 Temp: 97.9 F (36.6 C)     TempSrc: Oral   Oral  SpO2:  99% 98%    Exam Gen alert, no distress No rash, cyanosis or gangrene Sclera anicteric, throat clear  No jvd or bruits Chest clear bilat to bases, no rales/ wheezing RRR no MRG Abd soft ntnd no mass or ascites +bs GU  MS no joint effusions or deformity Ext no LE or UE edema, no wounds or ulcers Neuro is alert, Ox 3, R hemiparesis    RIJ TDC intact    Home meds include - coreg 12.5 bid, clonidine 0.1 tid, plavix, colace, dovato, doxepin, hydralazine 10 tid, insulin glargine, keppra, nepro, protonix, crestor, flomax, prns/ vits/ supps    [facility]  4h  400/800  70kg  2/2 bath TDC  Heparin 1600  - last HD 12/28, post wt 73.3kg, signing off early last 3 hd - hectorol 3 ug tiw  - venofer 50 weekly - mircera 200 q2, last 12/07, due 12/21  - last Hb 10.3 on 11/28    Na 137  K 5.5  CO2 17  AG 24  BUN 73  creat 15  Ca 8.8, alb 3.3    AST / ALT / tbili wnl   Hb 13.2 WBC 8K      CT abd - no acute findings, +renal cyst and non-obstructing renal stone x 1, normal pancreas  Assessment/ Plan: Abdominal pain - w/ N/V/D. CT abd negative. Per pmd H/o  CVA - residual hemiparesis HIV - on HAART ESRD - on HD TTS. Pt poor compliance w/ OP HD ,signs off early and often doesn't come to his Saturday dialysis. Creat high here.  Plan HD here today/ tonight or tomorrow. HD census/ staff mismatch is in effect - times are shortened and sessions will be delayed.  HTN/ volume - BP up a bit, taking coreg/ clonidine/ hydralazine at home. No vol excess on exam. Moderate UFG of 2 -2.5 L w/ next HD Anemia esrd - Hb 10-13 here, follow. Missed last OP esa dose on 12/21.  MBD ckd - CCa in range, add on phos. Cont IV vdra.  Hyperkalemia - mild, K+ 5.5     Rob Arshan Jabs  MD 09/20/2022, 4:27 PM Recent Labs  Lab 09/19/22 2140 09/19/22 2244  HGB  --  13.2  ALBUMIN 3.3*  --   CALCIUM 8.8*  --   CREATININE 15.11*  --   K 5.5*  --    Inpatient medications:  bictegravir-emtricitabine-tenofovir AF  1 tablet Oral Daily   carvedilol  12.5 mg Oral BID WC   [START ON 09/21/2022] Chlorhexidine Gluconate Cloth  6 each Topical Q0600   cloNIDine  0.1 mg Oral Q8H   clopidogrel  75 mg Oral Daily   doxepin  75 mg Oral QHS   heparin  5,000 Units Subcutaneous Q8H   hydrALAZINE  10 mg Oral Q8H   insulin aspart  0-6 Units Subcutaneous Q4H   levETIRAcetam  500 mg Oral BID   pantoprazole  40 mg Oral Daily   rosuvastatin  5 mg Oral Daily   tamsulosin  0.4 mg Oral Daily    acetaminophen **OR** acetaminophen, HYDROmorphone (DILAUDID) injection, ondansetron **OR** ondansetron (ZOFRAN) IV

## 2022-09-20 NOTE — Assessment & Plan Note (Addendum)
Suspect much of his AG of 24 is secondary to BUN of 73 today (uremia) Stat lactate ordered Stat BHB ordered BGL 130 only, no H/O DKA that I can see though.

## 2022-09-20 NOTE — Progress Notes (Signed)
There was a consult for PIV access. Restricted on Lt. Arm due to AV graft. Rt. Arm is contracted and limited mobility. Patient has very small veins which couldn't make position to place it and non-compressible veins on upper arm. Informed patient's RN regarding this matter. HS [employer]

## 2022-09-20 NOTE — Assessment & Plan Note (Signed)
SSI very sensitive Q4H

## 2022-09-20 NOTE — ED Notes (Addendum)
Phlebotomy unable to obtain AM labs. IV team unable to get additional IV access. MD notified.

## 2022-09-20 NOTE — Assessment & Plan Note (Addendum)
N/V/D, epigastric abd pain, and elevated Lipase.  In patient on a medicaiton known to cause acute pancreatitis as a major side effect. Suspicious for acute pancreatitis despite the negative CT read. DDx includes infectious gastroenteritis. Ischemic bowel seems unlikely given duration of symptoms and lack of CT findings. Hold Dovato NPO Will defer IVF for the moment to nephrology given ESRD status, very recent admission for fluid overload, etc. Dilaudid PRN pain Zofran PRN nausea Tele monitor

## 2022-09-21 DIAGNOSIS — K859 Acute pancreatitis without necrosis or infection, unspecified: Secondary | ICD-10-CM | POA: Diagnosis not present

## 2022-09-21 DIAGNOSIS — N186 End stage renal disease: Secondary | ICD-10-CM | POA: Diagnosis not present

## 2022-09-21 DIAGNOSIS — I6930 Unspecified sequelae of cerebral infarction: Secondary | ICD-10-CM | POA: Diagnosis not present

## 2022-09-21 DIAGNOSIS — E8729 Other acidosis: Secondary | ICD-10-CM | POA: Diagnosis not present

## 2022-09-21 LAB — BASIC METABOLIC PANEL
Anion gap: 20 — ABNORMAL HIGH (ref 5–15)
BUN: 79 mg/dL — ABNORMAL HIGH (ref 8–23)
CO2: 22 mmol/L (ref 22–32)
Calcium: 8.2 mg/dL — ABNORMAL LOW (ref 8.9–10.3)
Chloride: 95 mmol/L — ABNORMAL LOW (ref 98–111)
Creatinine, Ser: 16.58 mg/dL — ABNORMAL HIGH (ref 0.61–1.24)
GFR, Estimated: 3 mL/min — ABNORMAL LOW (ref >60)
Glucose, Bld: 108 mg/dL — ABNORMAL HIGH (ref 70–99)
Potassium: 4.9 mmol/L (ref 3.5–5.1)
Sodium: 137 mmol/L (ref 135–145)

## 2022-09-21 LAB — GLUCOSE, CAPILLARY
Glucose-Capillary: 78 mg/dL (ref 70–99)
Glucose-Capillary: 69 mg/dL — ABNORMAL LOW (ref 70–99)
Glucose-Capillary: 114 mg/dL — ABNORMAL HIGH (ref 70–99)
Glucose-Capillary: 62 mg/dL — ABNORMAL LOW (ref 70–99)
Glucose-Capillary: 81 mg/dL (ref 70–99)
Glucose-Capillary: 93 mg/dL (ref 70–99)
Glucose-Capillary: 79 mg/dL (ref 70–99)
Glucose-Capillary: 72 mg/dL (ref 70–99)
Glucose-Capillary: 107 mg/dL — ABNORMAL HIGH (ref 70–99)

## 2022-09-21 LAB — URINALYSIS, ROUTINE W REFLEX MICROSCOPIC
Bacteria, UA: NONE SEEN
Bilirubin Urine: NEGATIVE
Glucose, UA: 50 mg/dL — AB
Ketones, ur: NEGATIVE mg/dL
Leukocytes,Ua: NEGATIVE
Nitrite: NEGATIVE
Protein, ur: 100 mg/dL — AB
Specific Gravity, Urine: 1.014 (ref 1.005–1.030)
pH: 5 (ref 5.0–8.0)

## 2022-09-21 LAB — PHOSPHORUS: Phosphorus: 10.6 mg/dL — ABNORMAL HIGH (ref 2.5–4.6)

## 2022-09-21 MED ORDER — HEPARIN SODIUM (PORCINE) 1000 UNIT/ML DIALYSIS: 1500.0000 [IU] | INTRAMUSCULAR | Status: DC | PRN

## 2022-09-21 MED ORDER — HEPARIN SODIUM (PORCINE) 1000 UNIT/ML IJ SOLN
INTRAMUSCULAR | Status: AC
  Administered 2022-09-21: 1500 [IU] via INTRAVENOUS_CENTRAL
  Filled 2022-09-21: qty 7

## 2022-09-21 MED ORDER — HYDROMORPHONE HCL 1 MG/ML IJ SOLN
0.5000 mg | INTRAMUSCULAR | Status: DC | PRN
  Administered 2022-09-21 – 2022-09-22 (×2): 0.5 mg via INTRAVENOUS
  Filled 2022-09-21 (×2): qty 0.5

## 2022-09-21 MED ORDER — DEXTROSE 50 % IV SOLN
INTRAVENOUS | Status: AC
  Filled 2022-09-21: qty 50

## 2022-09-21 MED ORDER — TRAMADOL HCL 50 MG PO TABS
50.0000 mg | ORAL_TABLET | Freq: Two times a day (BID) | ORAL | Status: DC | PRN
  Administered 2022-09-21 – 2022-09-26 (×6): 50 mg via ORAL
  Filled 2022-09-21 (×6): qty 1

## 2022-09-21 MED ORDER — DIPHENHYDRAMINE HCL 50 MG/ML IJ SOLN: 25.0000 mg | Freq: Once | INTRAMUSCULAR | Status: DC

## 2022-09-21 NOTE — Progress Notes (Addendum)
Date and time results received: 09/21/22 0348 A.M.  Test: CBG Critical Value: 49  Name of Provider Notified: Raenette Rover, NP  Orders Received: Recheck blood sugar in 15 mins Or Actions Taken: Hypoglycemic protocol.   4:10 A.M. Patient non compliant with hypoglycemic protocol. Took couple sips of apple juice and stated :I'm good" when offered apple juice. Refused more sips. Educated patient on importance of keeping his blood sugar within a normal range. Notified on call.   D50 ordered by on-call. 25 mL D50 given

## 2022-09-21 NOTE — Hospital Course (Signed)
Richard [name] is a 66 y.o. 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 Patient presented secondary to abdominal pain and found to have a lipase of 507 with negative CT findings of pancreatitis. Patient made NPO and started on analgesics for pain management. Diet slowly advanced.

## 2022-09-21 NOTE — Progress Notes (Signed)
PROGRESS NOTE    Richard [name]  [id number] DOB: [birth year] DOA: 09/19/2022 PCP: Jodi Marble, MD   Brief Narrative: Richard [name] is a 66 y.o. 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 Patient presented secondary to abdominal pain and found to have a lipase of 507 with negative CT findings of pancreatitis. Patient made NPO and started on analgesics for pain management. Diet advanced to clear liquids.   Assessment and Plan:  Acute pancreatitis Abdominal pain with associated lipase of 507. CT negative for correlative findings. Recurrent issue. Patient is on Dovato which may be contributory. -Advance to clear liquid diet -Analgesics PRN   High anion gap acidosis Secondary to renal failure. -Management with HD   ESRD Nephrology consulted for hemodialysis while inpatient.   Primary hypertension Uncontrolled. Likely needs hemodialysis. Asymptomatic. Improved since starting hemodialysis. -Continue clonidine, hydralazine and Coreg -Hemodialysis for continued management of blood pressure   History CVA Noted. Chronic right hemiparesis.   HIV disease History of AIDS. Most recent CD4 count of 357 from 08/2022. Patient is on Dovato as an outpatient which was switched back to [place] secondary to concern that Dovato could possibly be contributing to pancreatitis. -Continue Biktarvy   Seizure disorder -Continue Keppra 500 mg BID   Diabetes mellitus, type 2 Controlled. Currently with hypoglycemia secondary to being made NPO. Hemoglobin A1C of 6.5%. -Continue SSI   GERD -Continue Protonix   Hyperlipidemia -Continue Crestor   DVT prophylaxis: Heparin subq Code Status:   Code Status: Full Code Family Communication: None at bedside Disposition Plan: Discharge home likely in 2-3 days once able to tolerate advanced  diet   Consultants:  Nephrology  Procedures:  Hemodialysis  Antimicrobials: None    Subjective: Patient reports continued abdominal pain but states it is slightly improved from yesterday. Hoping to advance diet.  Objective: BP (!) 110/48 (BP Location: Right Arm)   Pulse 78   Temp 97.7 F (36.5 C) (Oral)   Resp 15   SpO2 98%   Examination:  General exam: Appears calm and comfortable Respiratory system: Clear to auscultation. Respiratory effort normal. Cardiovascular system: S1 & S2 heard, RRR. No murmurs, rubs, gallops or clicks. Gastrointestinal system: Abdomen is nondistended, soft and slightly tender in epigastric area. Normal bowel sounds heard. Central nervous system: Alert and oriented. Musculoskeletal: No edema. No calf tenderness. Right arm contracture Skin: No cyanosis. No rashes Psychiatry: Judgement and insight appear normal. Mood & affect appropriate.    Data Reviewed: I have personally reviewed following labs and imaging studies  CBC Lab Results  Component Value Date   WBC 8.8 09/19/2022   RBC 4.21 (L) 09/19/2022   HGB 13.2 09/19/2022   HCT 39.6 09/19/2022   MCV 94.1 09/19/2022   MCH 31.4 09/19/2022   PLT 255 09/19/2022   MCHC 33.3 09/19/2022   RDW 15.4 09/19/2022   LYMPHSABS 1.6 09/19/2022   MONOABS 1.0 09/19/2022   EOSABS 0.2 09/19/2022   BASOSABS 0.1 72/05/4708     Last metabolic panel Lab Results  Component Value Date   NA 137 09/19/2022   K 5.5 (H) 09/19/2022   CL 96 (L) 09/19/2022   CO2 17 (L) 09/19/2022   BUN 73 (H) 09/19/2022   CREATININE 15.11 (H) 09/19/2022   GLUCOSE 138 (H) 09/19/2022   GFRNONAA 3 (L) 09/19/2022   GFRAA 7 (L) 06/02/2020   CALCIUM 8.8 (L) 09/19/2022  PHOS 10.2 (H) 09/20/2022   PROT 7.5 09/19/2022   ALBUMIN 3.3 (L) 09/19/2022   BILITOT 0.7 09/19/2022   ALKPHOS 103 09/19/2022   AST 32 09/19/2022   ALT 16 09/19/2022   ANIONGAP 24 (H) 09/19/2022    GFR: CrCl cannot be calculated (Unknown ideal  weight.).  No results found for this or any previous visit (from the past 240 hour(s)).    Radiology Studies: CT ABDOMEN PELVIS WO CONTRAST  Result Date: 09/19/2022 CLINICAL DATA:  Abdominal pain. Nausea and vomiting. Patient scanned with right side down due to contracted posture. EXAM: CT ABDOMEN AND PELVIS WITHOUT CONTRAST TECHNIQUE: Multidetector CT imaging of the abdomen and pelvis was performed following the standard protocol without IV contrast. RADIATION DOSE REDUCTION: This exam was performed according to the departmental dose-optimization program which includes automated exposure control, adjustment of the mA and/or kV according to patient size and/or use of iterative reconstruction technique. COMPARISON:  CT examination dated August 09, 2020 FINDINGS: Lower chest: No acute abnormality. Hepatobiliary: No focal liver abnormality is seen. No gallstones, gallbladder wall thickening, or biliary dilatation. Pancreas: Unremarkable. No pancreatic ductal dilatation or surrounding inflammatory changes. Spleen: Normal in size without focal abnormality. Adrenals/Urinary Tract: Adrenal glands are unremarkable. Simple cyst in the interpolar region of the right kidney measuring up to 2 cm. Nonobstructing 2-3 mm calculus in the lower pole of the left kidney. Evidence of hydronephrosis. No appreciable ureteral calculus. Bladder is unremarkable. Stomach/Bowel: Stomach is within normal limits. Appendix appears normal. No evidence of bowel wall thickening, distention, or inflammatory changes. Vascular/Lymphatic: Aortic atherosclerosis. No enlarged abdominal or pelvic lymph nodes. Reproductive: Prostate is unremarkable. Other: No abdominal wall hernia or abnormality. No abdominopelvic ascites. Musculoskeletal: No acute or significant osseous findings. IMPRESSION: 1. Nonobstructing 2-3 mm calculus in the lower pole of the left kidney. No evidence of hydronephrosis or ureteral calculus. 2. Simple cyst in the  interpolar region of the right kidney measuring up to 2 cm. 3. Bowel loops are normal in caliber. Normal appendix. No evidence of colitis or diverticulitis. 4. Aortic atherosclerosis. Aortic Atherosclerosis (ICD10-I70.0). Electronically Signed   By: Keane Police D.O.   On: 09/19/2022 23:35      LOS: 1 day    Cordelia Poche, MD [facility] 09/21/2022, 1:23 PM   If 7PM-7AM, please contact night-coverage [email or web address]

## 2022-09-21 NOTE — Progress Notes (Signed)
[facility] Progress Note   Subjective:   Having a lot of abdominal pain this AM, no appetite. Denies SOB, CP, dizziness and nausea.  Refused AM labs.   Objective Vitals:   09/20/22 2024 09/21/22 0017 09/21/22 0400 09/21/22 0819  BP: (!) 156/63 (!) 164/74 (!) 182/73 (!) 156/66  Pulse: 83 84 79 73  Resp: 12 17 15 18   Temp: 97.7 F (36.5 C)   97.7 F (36.5 C)  TempSrc: Oral   Oral  SpO2: 98% 95% 90% 100%   Physical Exam General: Alert male in NAD Heart: RRR, no murmurs, rubs or gallops Lungs: CTA bilaterally without wheezing, rhonchi or rales Abdomen: Soft, non-distended Extremities: No edema b/l lower extremities Dialysis Access: R IJ [facility]  Additional Objective Labs: Basic Metabolic Panel: Recent Labs  Lab 09/19/22 2140 09/20/22 1750  NA 137  --   K 5.5*  --   CL 96*  --   CO2 17*  --   GLUCOSE 138*  --   BUN 73*  --   CREATININE 15.11*  --   CALCIUM 8.8*  --   PHOS  --  10.2*   Liver Function Tests: Recent Labs  Lab 09/19/22 2140  AST 32  ALT 16  ALKPHOS 103  BILITOT 0.7  PROT 7.5  ALBUMIN 3.3*   Recent Labs  Lab 09/19/22 2140  LIPASE 507*   CBC: Recent Labs  Lab 09/19/22 2244  WBC 8.8  NEUTROABS 5.9  HGB 13.2  HCT 39.6  MCV 94.1  PLT 255   Blood Culture    Component Value Date/Time   SDES BLOOD RIGHT ANTECUBITAL 09/01/2022 0122   SPECREQUEST  09/01/2022 0122    BOTTLES DRAWN AEROBIC AND ANAEROBIC Blood Culture adequate volume   CULT  09/01/2022 0122    NO GROWTH 5 DAYS Performed at [facility], [place] [address] [place],  [address]    REPTSTATUS 09/06/2022 FINAL 09/01/2022 0122    Cardiac Enzymes: No results for input(s): "CKTOTAL", "CKMB", "CKMBINDEX", "TROPONINI" in the last 168 hours. CBG: Recent Labs  Lab 09/20/22 2349 09/21/22 0348 09/21/22 0454 09/21/22 0523 09/21/22 0823  GLUCAP 78 69* 62* 114* 81   Iron Studies: No results for input(s): "IRON", "TIBC", "TRANSFERRIN", "FERRITIN" in the  last 72 hours. @lablastinr3 @ Studies/Results: CT ABDOMEN PELVIS WO CONTRAST  Result Date: 09/19/2022 CLINICAL DATA:  Abdominal pain. Nausea and vomiting. Patient scanned with right side down due to contracted posture. EXAM: CT ABDOMEN AND PELVIS WITHOUT CONTRAST TECHNIQUE: Multidetector CT imaging of the abdomen and pelvis was performed following the standard protocol without IV contrast. RADIATION DOSE REDUCTION: This exam was performed according to the departmental dose-optimization program which includes automated exposure control, adjustment of the mA and/or kV according to patient size and/or use of iterative reconstruction technique. COMPARISON:  CT examination dated August 09, 2020 FINDINGS: Lower chest: No acute abnormality. Hepatobiliary: No focal liver abnormality is seen. No gallstones, gallbladder wall thickening, or biliary dilatation. Pancreas: Unremarkable. No pancreatic ductal dilatation or surrounding inflammatory changes. Spleen: Normal in size without focal abnormality. Adrenals/Urinary Tract: Adrenal glands are unremarkable. Simple cyst in the interpolar region of the right kidney measuring up to 2 cm. Nonobstructing 2-3 mm calculus in the lower pole of the left kidney. Evidence of hydronephrosis. No appreciable ureteral calculus. Bladder is unremarkable. Stomach/Bowel: Stomach is within normal limits. Appendix appears normal. No evidence of bowel wall thickening, distention, or inflammatory changes. Vascular/Lymphatic: Aortic atherosclerosis. No enlarged abdominal or pelvic lymph nodes. Reproductive: Prostate  is unremarkable. Other: No abdominal wall hernia or abnormality. No abdominopelvic ascites. Musculoskeletal: No acute or significant osseous findings. IMPRESSION: 1. Nonobstructing 2-3 mm calculus in the lower pole of the left kidney. No evidence of hydronephrosis or ureteral calculus. 2. Simple cyst in the interpolar region of the right kidney measuring up to 2 cm. 3. Bowel loops  are normal in caliber. Normal appendix. No evidence of colitis or diverticulitis. 4. Aortic atherosclerosis. Aortic Atherosclerosis (ICD10-I70.0). Electronically Signed   By: Keane Police D.O.   On: 09/19/2022 23:35   Medications:   bictegravir-emtricitabine-tenofovir AF  1 tablet Oral Daily   carvedilol  12.5 mg Oral BID WC   Chlorhexidine Gluconate Cloth  6 each Topical Q0600   cloNIDine  0.1 mg Oral Q8H   clopidogrel  75 mg Oral Daily   dextrose       doxepin  75 mg Oral QHS   [START ON 09/23/2022] doxercalciferol  3 mcg Intravenous Q T,Th,Sa-HD   heparin  5,000 Units Subcutaneous Q8H   hydrALAZINE  10 mg Oral Q8H   insulin aspart  0-6 Units Subcutaneous Q4H   levETIRAcetam  500 mg Oral BID   pantoprazole  40 mg Oral Daily   rosuvastatin  5 mg Oral Daily   tamsulosin  0.4 mg Oral Daily    Outpatient Dialysis Orders: [facility]  4h  400/800  70kg  2/2 bath TDC  Heparin 1600  - last HD 12/28, post wt 73.3kg, signing off early last 3 hd - hectorol 3 ug tiw  - venofer 50 weekly - mircera 200 q2, last 12/07, due 12/21  - last Hb 10.3 on 11/28     Na 137  K 5.5  CO2 17  AG 24  BUN 73  creat 15  Ca 8.8, alb 3.3    AST / ALT / tbili wnl   Hb 13.2 WBC 8K      CT abd - no acute findings, +renal cyst and non-obstructing renal stone x 1, normal pancreas  Assessment/Plan: Abdominal pain - w/ N/V/D. CT abd negative. Suspected to be acute pancreatitis. Per pmd ESRD - on HD TTS. Pt poor compliance w/ OP HD ,signs off early and often doesn't come to his Saturday dialysis. Creat high here.  Saturday treatment was delayed due to high census/low staff, scheduled to have dialysis today.  HTN/ volume - BP up a bit, taking coreg/ clonidine/ hydralazine at home. No vol excess on exam. Moderate UFG of 2 -2.5 L w/ next HD Anemia esrd - Hb 10-13 here, follow. Missed last OP esa dose on 12/21 but will hold for now with last hb >12 MBD ckd - CCa in range, phos 6.8. Cont IV vdra. Resume binders once  tolerating PO.  Hyperkalemia - mild, K+ 5.5. Refused AM labs today. Scheduled for HD today.   Anice Paganini, PA-C 09/21/2022, 9:10 AM  [facility] Pager: [phone]

## 2022-09-21 NOTE — Procedures (Signed)
HD Note:  Some information was entered later than the data was gathered due to patient care needs. The stated time with the data is accurate.  Patient arrived to the unit alert and oriented x4. He complained of stomach aching, meds had been given prior to being sent for treatment, see MAR  Patient goal was set for 3000 ml with SBP to stay above 85.  Patient BP went below 85 with patient less responsive, 200 ml given back, and UF held until SBP recovered.  See Flowsheet.  Patient total UF 1800 ml.  Patient very sleepy when returning to his room.  He denies any pain at this time.  Artist Pais, RN  [facility]

## 2022-09-21 NOTE — Progress Notes (Signed)
Patient refused lab draw.

## 2022-09-22 DIAGNOSIS — N186 End stage renal disease: Secondary | ICD-10-CM | POA: Diagnosis not present

## 2022-09-22 DIAGNOSIS — E8729 Other acidosis: Secondary | ICD-10-CM | POA: Diagnosis not present

## 2022-09-22 DIAGNOSIS — I6930 Unspecified sequelae of cerebral infarction: Secondary | ICD-10-CM | POA: Diagnosis not present

## 2022-09-22 DIAGNOSIS — K859 Acute pancreatitis without necrosis or infection, unspecified: Secondary | ICD-10-CM | POA: Diagnosis not present

## 2022-09-22 LAB — GLUCOSE, CAPILLARY
Glucose-Capillary: 56 mg/dL — ABNORMAL LOW (ref 70–99)
Glucose-Capillary: 82 mg/dL (ref 70–99)
Glucose-Capillary: 85 mg/dL (ref 70–99)
Glucose-Capillary: 66 mg/dL — ABNORMAL LOW (ref 70–99)
Glucose-Capillary: 107 mg/dL — ABNORMAL HIGH (ref 70–99)
Glucose-Capillary: 79 mg/dL (ref 70–99)
Glucose-Capillary: 72 mg/dL (ref 70–99)
Glucose-Capillary: 84 mg/dL (ref 70–99)

## 2022-09-22 MED ORDER — DEXTROSE 50 % IV SOLN
INTRAVENOUS | Status: AC
  Filled 2022-09-22: qty 50

## 2022-09-22 MED ORDER — GLUCAGON HCL RDNA (DIAGNOSTIC) 1 MG IJ SOLR
INTRAMUSCULAR | Status: AC
  Administered 2022-09-22: 1 mg
  Filled 2022-09-22: qty 1

## 2022-09-22 MED ORDER — PENTAFLUOROPROP-TETRAFLUOROETH EX AERO: 1.0000 | INHALATION_SPRAY | CUTANEOUS | Status: DC | PRN

## 2022-09-22 MED ORDER — ALTEPLASE 2 MG IJ SOLR: 2.0000 mg | Freq: Once | INTRAMUSCULAR | Status: DC | PRN

## 2022-09-22 MED ORDER — DEXTROSE 50 % IV SOLN
12.5000 g | INTRAVENOUS | Status: AC
  Administered 2022-09-23: 12.5 g via INTRAVENOUS
  Filled 2022-09-22: qty 50

## 2022-09-22 MED ORDER — LIDOCAINE-PRILOCAINE 2.5-2.5 % EX CREA: 1.0000 | TOPICAL_CREAM | CUTANEOUS | Status: DC | PRN

## 2022-09-22 MED ORDER — LIDOCAINE HCL (PF) 1 % IJ SOLN: 5.0000 mL | INTRAMUSCULAR | Status: DC | PRN

## 2022-09-22 MED ORDER — HEPARIN SODIUM (PORCINE) 1000 UNIT/ML DIALYSIS: 1000.0000 [IU] | INTRAMUSCULAR | Status: DC | PRN

## 2022-09-22 NOTE — Progress Notes (Signed)
PROGRESS NOTE    Gabrien Mentink  VOJ:[id number] DOB: [birth year] DOA: 09/19/2022 PCP: Jodi Marble, MD   Brief Narrative: Richard [name] is a 67 y.o. 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 Patient presented secondary to abdominal pain and found to have a lipase of 507 with negative CT findings of pancreatitis. Patient made NPO and started on analgesics for pain management. Diet advanced to clear liquids.   Assessment and Plan:  Acute pancreatitis Abdominal pain with associated lipase of 507. CT negative for correlative findings. Recurrent issue. Patient is on Dovato which may be contributory. Continued abdominal pain -Continue to clear liquid diet -Analgesics PRN -CMP/Lipase   High anion gap acidosis Secondary to renal failure. -Management with HD   ESRD Nephrology consulted for hemodialysis while inpatient.   Primary hypertension Uncontrolled. Likely needs hemodialysis. Asymptomatic. Improved since starting hemodialysis. -Continue clonidine, hydralazine and Coreg -Hemodialysis for continued management of blood pressure   History CVA Noted. Chronic right hemiparesis.   HIV disease History of AIDS. Most recent CD4 count of 357 from 08/2022. Patient is on Dovato as an outpatient which was switched back to [place] secondary to concern that Dovato could possibly be contributing to pancreatitis. -Continue Biktarvy   Seizure disorder -Continue Keppra 500 mg BID   Diabetes mellitus, type 2 Controlled. Currently with hypoglycemia secondary to being made NPO. Hemoglobin A1C of 6.5%. -Continue SSI   GERD -Continue Protonix   Hyperlipidemia -Continue Crestor   DVT prophylaxis: Heparin subq Code Status:   Code Status: Full Code Family Communication: None at bedside Disposition Plan: Discharge home likely in 2-3 days once  able to tolerate advanced diet   Consultants:  Nephrology  Procedures:  Hemodialysis  Antimicrobials: None    Subjective: Continued abdominal pain. No emesis.  Objective: BP (!) 145/66   Pulse 82   Temp 98 F (36.7 C) (Oral)   Resp 16   Wt 74 kg   SpO2 100%   BMI 22.13 kg/m   Examination:  General exam: Appears calm and comfortable Respiratory system: Clear to auscultation. Respiratory effort normal. Cardiovascular system: S1 & S2 heard, RRR. Gastrointestinal system: Abdomen is non-distended, soft and mildly tender generally. Normal bowel sounds heard. Central nervous system: Alert and oriented. No focal neurological deficits.   Data Reviewed: I have personally reviewed following labs and imaging studies  CBC Lab Results  Component Value Date   WBC 8.8 09/19/2022   RBC 4.21 (L) 09/19/2022   HGB 13.2 09/19/2022   HCT 39.6 09/19/2022   MCV 94.1 09/19/2022   MCH 31.4 09/19/2022   PLT 255 09/19/2022   MCHC 33.3 09/19/2022   RDW 15.4 09/19/2022   LYMPHSABS 1.6 09/19/2022   MONOABS 1.0 09/19/2022   EOSABS 0.2 09/19/2022   BASOSABS 0.1 99/37/1696     Last metabolic panel Lab Results  Component Value Date   NA 137 09/21/2022   K 4.9 09/21/2022   CL 95 (L) 09/21/2022   CO2 22 09/21/2022   BUN 79 (H) 09/21/2022   CREATININE 16.58 (H) 09/21/2022   GLUCOSE 108 (H) 09/21/2022   GFRNONAA 3 (L) 09/21/2022   GFRAA 7 (L) 06/02/2020   CALCIUM 8.2 (L) 09/21/2022   PHOS 10.6 (H) 09/21/2022   PROT 7.5 09/19/2022   ALBUMIN 3.3 (L) 09/19/2022   BILITOT 0.7 09/19/2022   ALKPHOS 103 09/19/2022   AST 32 09/19/2022   ALT  16 09/19/2022   ANIONGAP 20 (H) 09/21/2022    GFR: Estimated Creatinine Clearance: 4.6 mL/min (A) (by C-G formula based on SCr of 16.58 mg/dL (H)).  No results found for this or any previous visit (from the past 240 hour(s)).    Radiology Studies: No results found.    LOS: 2 days    Cordelia Poche, MD [facility] 09/22/2022, 9:27  AM   If 7PM-7AM, please contact night-coverage [email or web address]

## 2022-09-22 NOTE — Progress Notes (Addendum)
Pt refused labs this morning. Pt was encouraged and educated-still refused.

## 2022-09-22 NOTE — Progress Notes (Signed)
Latest Reference Range & Units 09/22/22 03:40 09/22/22 04:15 09/22/22 04:27  Glucose-Capillary 70 - 99 mg/dL 56 (L) 82 85  Patient refused last check, verbalized he did not want to be stick again.

## 2022-09-22 NOTE — Progress Notes (Signed)
CBG 72. Pt is encourage to eat and drink. Pt is refusing. This RN explained to the pt that if he doesn't eat or drink -his blood sugar will continue to decrease and he can become lethargic. Pt disagrees. States "I'm fine." MD is notified.

## 2022-09-22 NOTE — Plan of Care (Signed)
  Problem: Skin Integrity: Goal: Risk for impaired skin integrity will decrease Outcome: Progressing   Problem: Pain Managment: Goal: General experience of comfort will improve Outcome: Progressing   Problem: Safety: Goal: Ability to remain free from injury will improve Outcome: Progressing   

## 2022-09-22 NOTE — Progress Notes (Signed)
Pt refused labs without any explanation. Pt was educated about the importance of lab work; refused again. MD Lonny Prude was notified.

## 2022-09-22 NOTE — Progress Notes (Addendum)
IVTeam consult for no cap on HD cath. Blue lumen found without cap. Hub cleaned with CHG. Dead end cap applied. Both lumen were clamped, no bleeding noted. Routine line care orders added for VAST monitoring of HD cath.

## 2022-09-22 NOTE — Plan of Care (Signed)
  Problem: Tissue Perfusion: Goal: Adequacy of tissue perfusion will improve Outcome: Progressing   Problem: Clinical Measurements: Goal: Will remain free from infection Outcome: Progressing Goal: Respiratory complications will improve Outcome: Progressing   Problem: Safety: Goal: Ability to remain free from injury will improve Outcome: Progressing

## 2022-09-22 NOTE — Progress Notes (Addendum)
[facility] Progress Note   Subjective:    Seen and examined patient at bedside. Tolerated yesterday's HD with net UF 1.8L (HD off schedule high census/low staff). Currently c/o R foot pain. Noted PRN Dilaudid for acute pancreatitis. Denies SOB, CP, and N/V. Next HD 09/23/22.   Objective Vitals:   09/21/22 1753 09/21/22 1826 09/21/22 2100 09/21/22 2139  BP: (!) 88/42 127/65  (!) 150/83  Pulse: 84 85    Resp: 11 18    Temp:  98.3 F (36.8 C) 98 F (36.7 C)   TempSrc:  Oral Oral   SpO2: 97% 98%    Weight:       Physical Exam General: Alert male in NAD Heart: RRR, no murmurs, rubs or gallops Lungs: CTA bilaterally without wheezing, rhonchi or rales Abdomen: Soft, non-distended Extremities: No edema b/l lower extremities Dialysis Access: R [facility]  Filed Weights   09/21/22 1415  Weight: 74 kg    Intake/Output Summary (Last 24 hours) at 09/22/2022 0839 Last data filed at 09/21/2022 1753 Gross per 24 hour  Intake --  Output 1800 ml  Net -1800 ml    Additional Objective Labs: Basic Metabolic Panel: Recent Labs  Lab 09/19/22 2140 09/20/22 1750 09/21/22 1433  NA 137  --  137  K 5.5*  --  4.9  CL 96*  --  95*  CO2 17*  --  22  GLUCOSE 138*  --  108*  BUN 73*  --  79*  CREATININE 15.11*  --  16.58*  CALCIUM 8.8*  --  8.2*  PHOS  --  10.2* 10.6*   Liver Function Tests: Recent Labs  Lab 09/19/22 2140  AST 32  ALT 16  ALKPHOS 103  BILITOT 0.7  PROT 7.5  ALBUMIN 3.3*   Recent Labs  Lab 09/19/22 2140  LIPASE 507*   CBC: Recent Labs  Lab 09/19/22 2244  WBC 8.8  NEUTROABS 5.9  HGB 13.2  HCT 39.6  MCV 94.1  PLT 255   Blood Culture    Component Value Date/Time   SDES BLOOD RIGHT ANTECUBITAL 09/01/2022 0122   SPECREQUEST  09/01/2022 0122    BOTTLES DRAWN AEROBIC AND ANAEROBIC Blood Culture adequate volume   CULT  09/01/2022 0122    NO GROWTH 5 DAYS Performed at [facility], [place] [address] [place],  [address]     REPTSTATUS 09/06/2022 FINAL 09/01/2022 0122    Cardiac Enzymes: No results for input(s): "CKTOTAL", "CKMB", "CKMBINDEX", "TROPONINI" in the last 168 hours. CBG: Recent Labs  Lab 09/21/22 2321 09/22/22 0340 09/22/22 0415 09/22/22 0427 09/22/22 0825  GLUCAP 107* 56* 82 85 66*   Iron Studies: No results for input(s): "IRON", "TIBC", "TRANSFERRIN", "FERRITIN" in the last 72 hours. Lab Results  Component Value Date   INR 1.1 07/23/2022   INR 1.1 12/19/2021   INR 1.1 04/13/2020   Studies/Results: No results found.  Medications:   bictegravir-emtricitabine-tenofovir AF  1 tablet Oral Daily   carvedilol  12.5 mg Oral BID WC   Chlorhexidine Gluconate Cloth  6 each Topical Q0600   cloNIDine  0.1 mg Oral Q8H   clopidogrel  75 mg Oral Daily   dextrose  12.5 g Intravenous STAT   dextrose       diphenhydrAMINE  25-50 mg Intravenous Once in dialysis   doxepin  75 mg Oral QHS   [START ON 09/23/2022] doxercalciferol  3 mcg Intravenous Q T,Th,Sa-HD   heparin  5,000 Units Subcutaneous Q8H   hydrALAZINE  10 mg Oral Q8H   insulin aspart  0-6 Units Subcutaneous Q4H   levETIRAcetam  500 mg Oral BID   pantoprazole  40 mg Oral Daily   rosuvastatin  5 mg Oral Daily   tamsulosin  0.4 mg Oral Daily    Dialysis Orders: [facility]  4h  400/800  70kg  2/2 bath TDC  Heparin 1600  - last HD 12/28, post wt 73.3kg, signing off early last 3 hd - hectorol 3 ug tiw  - venofer 50 weekly - mircera 200 q2, last 12/07, due 12/21  - last Hb 10.3 on 11/28     Na 137  K 5.5  CO2 17  AG 24  BUN 73  creat 15  Ca 8.8, alb 3.3    AST / ALT / tbili wnl   Hb 13.2 WBC 8K      CT abd - no acute findings, +renal cyst and non-obstructing renal stone x 1, normal pancreas  Assessment/Plan: Abdominal pain - w/ N/V/D. CT abd negative. Suspected to be acute pancreatitis. Per pmd ESRD - on HD TTS. Pt poor compliance w/ OP HD ,signs off early and often doesn't come to his Saturday dialysis. Creat high here.  Saturday  treatment was delayed due to high census/low staff. Received HD 12/31. Next HD 09/23/22. HTN/ volume - BP up a bit, taking coreg/ clonidine/ hydralazine at home. No vol excess on exam. Moderate UFG of 2 -2.5 L w/ next HD Anemia esrd - Hb 10-13 here, follow. Missed last OP esa dose on 12/21 but will hold for now with last hb >12 MBD ckd - CCa in range, phos 6.8. Cont IV vdra. Resume binders once tolerating PO.  Hyperkalemia - mild, patient has been refusing labs. HD 12/31, K+ now 4.9. Follow trend.   Tobie Poet, NP [facility] 09/22/2022,8:39 AM  LOS: 2 days

## 2022-09-22 NOTE — Progress Notes (Signed)
Patient's RN put in the consult for PIV access. RN Merceda Elks stated that PIV access for D50%W IV push if patient is hypoglycemic. 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 Advised patient's RN regarding the best way to treat hypoglycemia is glucagon tablets or gel type. Informed Dr. Lonny Prude regarding this matter. No PIV access at this time. HS [employer]

## 2022-09-22 NOTE — Progress Notes (Signed)
Pt's CBG at 0825 was 66. Pt was alert and oriented. Pt was encouarge to drink some apple juice. CBG rechecked at 0937- 107. Dr Lonny Prude was notified.

## 2022-09-22 NOTE — Progress Notes (Signed)
Nurse Tech reported that pt CBG was 56. 1mg  of Glucagon administered IM. Will recheck blood sugar in 15 mins.

## 2022-09-23 DIAGNOSIS — I6930 Unspecified sequelae of cerebral infarction: Secondary | ICD-10-CM | POA: Diagnosis not present

## 2022-09-23 DIAGNOSIS — K859 Acute pancreatitis without necrosis or infection, unspecified: Secondary | ICD-10-CM | POA: Diagnosis not present

## 2022-09-23 DIAGNOSIS — E8729 Other acidosis: Secondary | ICD-10-CM | POA: Diagnosis not present

## 2022-09-23 DIAGNOSIS — N186 End stage renal disease: Secondary | ICD-10-CM | POA: Diagnosis not present

## 2022-09-23 LAB — RENAL FUNCTION PANEL
Albumin: 2.8 g/dL — ABNORMAL LOW (ref 3.5–5.0)
Anion gap: 13 (ref 5–15)
BUN: 43 mg/dL — ABNORMAL HIGH (ref 8–23)
CO2: 26 mmol/L (ref 22–32)
Calcium: 8.5 mg/dL — ABNORMAL LOW (ref 8.9–10.3)
Chloride: 93 mmol/L — ABNORMAL LOW (ref 98–111)
Creatinine, Ser: 10.92 mg/dL — ABNORMAL HIGH (ref 0.61–1.24)
GFR, Estimated: 5 mL/min — ABNORMAL LOW (ref >60)
Glucose, Bld: 84 mg/dL (ref 70–99)
Phosphorus: 8 mg/dL — ABNORMAL HIGH (ref 2.5–4.6)
Potassium: 4.2 mmol/L (ref 3.5–5.1)
Sodium: 132 mmol/L — ABNORMAL LOW (ref 135–145)

## 2022-09-23 LAB — GLUCOSE, CAPILLARY
Glucose-Capillary: 60 mg/dL — ABNORMAL LOW (ref 70–99)
Glucose-Capillary: 124 mg/dL — ABNORMAL HIGH (ref 70–99)
Glucose-Capillary: 48 mg/dL — ABNORMAL LOW (ref 70–99)
Glucose-Capillary: 159 mg/dL — ABNORMAL HIGH (ref 70–99)
Glucose-Capillary: 109 mg/dL — ABNORMAL HIGH (ref 70–99)
Glucose-Capillary: 66 mg/dL — ABNORMAL LOW (ref 70–99)
Glucose-Capillary: 144 mg/dL — ABNORMAL HIGH (ref 70–99)

## 2022-09-23 LAB — CBC
HCT: 36.5 % — ABNORMAL LOW (ref 39.0–52.0)
Hemoglobin: 11.4 g/dL — ABNORMAL LOW (ref 13.0–17.0)
MCH: 30.7 pg (ref 26.0–34.0)
MCHC: 31.2 g/dL (ref 30.0–36.0)
MCV: 98.4 fL (ref 80.0–100.0)
Platelets: 232 10*3/uL (ref 150–400)
RBC: 3.71 MIL/uL — ABNORMAL LOW (ref 4.22–5.81)
RDW: 15 % (ref 11.5–15.5)
WBC: 6.5 10*3/uL (ref 4.0–10.5)
nRBC: 0 % (ref 0.0–0.2)

## 2022-09-23 MED ORDER — HEPARIN SODIUM (PORCINE) 1000 UNIT/ML IJ SOLN
INTRAMUSCULAR | Status: AC
  Administered 2022-09-23: 1000 [IU] via INTRAVENOUS_CENTRAL
  Filled 2022-09-23: qty 1

## 2022-09-23 MED ORDER — ALBUMIN HUMAN 25 % IV SOLN
25.0000 g | INTRAVENOUS | Status: AC | PRN
  Administered 2022-09-23 – 2022-09-25 (×2): 25 g via INTRAVENOUS
  Filled 2022-09-23 (×2): qty 100

## 2022-09-23 MED ORDER — DEXTROSE 50 % IV SOLN
INTRAVENOUS | Status: AC
  Administered 2022-09-23: 50 mL
  Filled 2022-09-23: qty 50

## 2022-09-23 MED ORDER — ANTICOAGULANT SODIUM CITRATE 4% (200MG/5ML) IV SOLN: 5.0000 mL | Status: DC | PRN

## 2022-09-23 MED ORDER — ALTEPLASE 2 MG IJ SOLR: 2.0000 mg | Freq: Once | INTRAMUSCULAR | Status: DC | PRN

## 2022-09-23 MED ORDER — DEXTROSE 50 % IV SOLN
INTRAVENOUS | Status: AC
  Filled 2022-09-23: qty 50

## 2022-09-23 MED ORDER — HEPARIN SODIUM (PORCINE) 1000 UNIT/ML DIALYSIS
1000.0000 [IU] | INTRAMUSCULAR | Status: DC | PRN
  Administered 2022-09-25: 3800 [IU]
  Filled 2022-09-23: qty 1

## 2022-09-23 NOTE — Progress Notes (Signed)
PROGRESS NOTE    Aubry Tucholski  LPF:[id number] DOB: [birth year] DOA: 09/19/2022 PCP: Jodi Marble, MD   Brief Narrative: Richard [name] is a 67 y.o. 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 Patient presented secondary to abdominal pain and found to have a lipase of 507 with negative CT findings of pancreatitis. Patient made NPO and started on analgesics for pain management. Diet advanced to clear liquids.   Assessment and Plan:  Acute pancreatitis Abdominal pain with associated lipase of 507. CT negative for correlative findings. Recurrent issue. Patient is on Dovato which may be contributory. Continued abdominal pain which is not associated with food intake. Repeat CMP/lipase ordered but patient refused labs.  -Advance to full liquid diet -Analgesics PRN   High anion gap acidosis Secondary to renal failure. -Management with HD   ESRD Nephrology consulted for hemodialysis while inpatient.   Primary hypertension Uncontrolled. Likely needs hemodialysis. Asymptomatic. Improved since starting hemodialysis. -Continue clonidine, hydralazine and Coreg -Hemodialysis for continued management of blood pressure   History CVA Noted. Chronic right hemiparesis.   HIV disease History of AIDS. Most recent CD4 count of 357 from 08/2022. Patient is on Dovato as an outpatient which was switched back to [place] secondary to concern that Dovato could possibly be contributing to pancreatitis. -Continue Biktarvy   Seizure disorder -Continue Keppra 500 mg BID   Diabetes mellitus, type 2 Controlled. Currently with hypoglycemia secondary to being made NPO. Hemoglobin A1C of 6.5%. -Continue SSI   GERD -Continue Protonix   Hyperlipidemia -Continue Crestor   DVT prophylaxis: Heparin subq Code Status:   Code Status: Full Code Family  Communication: None at bedside Disposition Plan: Discharge home likely in 2-3 days once able to tolerate advanced diet   Consultants:  Nephrology  Procedures:  Hemodialysis  Antimicrobials: None    Subjective: Patient reports continued abdominal pain. No nausea or vomiting. Afebrile.  Objective: BP (!) 175/108 (BP Location: Right Arm)   Pulse 97   Temp 97.8 F (36.6 C) (Oral)   Resp 17   Wt 72.5 kg   SpO2 100%   BMI 21.68 kg/m   Examination:  General exam: Appears calm and comfortable. In hemodialysis Respiratory system: Clear to auscultation. Respiratory effort normal. Cardiovascular system: S1 & S2 heard. Gastrointestinal system: Abdomen is nondistended, soft and with no significant tenderness.  Normal bowel sounds heard. Central nervous system: Alert and oriented. Right hemiplegia. Dysarthria. Musculoskeletal: No edema. No calf tenderness. Right upper extremity contracture   Data Reviewed: I have personally reviewed following labs and imaging studies  CBC Lab Results  Component Value Date   WBC 6.5 09/23/2022   RBC 3.71 (L) 09/23/2022   HGB 11.4 (L) 09/23/2022   HCT 36.5 (L) 09/23/2022   MCV 98.4 09/23/2022   MCH 30.7 09/23/2022   PLT 232 09/23/2022   MCHC 31.2 09/23/2022   RDW 15.0 09/23/2022   LYMPHSABS 1.6 09/19/2022   MONOABS 1.0 09/19/2022   EOSABS 0.2 09/19/2022   BASOSABS 0.1 53/29/9242     Last metabolic panel Lab Results  Component Value Date   NA 132 (L) 09/23/2022   K 4.2 09/23/2022   CL 93 (L) 09/23/2022   CO2 26 09/23/2022   BUN 43 (H) 09/23/2022   CREATININE 10.92 (H) 09/23/2022   GLUCOSE 84 09/23/2022   GFRNONAA 5 (L) 09/23/2022   GFRAA 7 (L) 06/02/2020   CALCIUM 8.5 (  L) 09/23/2022   PHOS 8.0 (H) 09/23/2022   PROT 7.5 09/19/2022   ALBUMIN 2.8 (L) 09/23/2022   BILITOT 0.7 09/19/2022   ALKPHOS 103 09/19/2022   AST 32 09/19/2022   ALT 16 09/19/2022   ANIONGAP 13 09/23/2022    GFR: Estimated Creatinine Clearance: 6.9  mL/min (A) (by C-G formula based on SCr of 10.92 mg/dL (H)).  No results found for this or any previous visit (from the past 240 hour(s)).    Radiology Studies: No results found.    LOS: 3 days    Cordelia Poche, MD [facility] 09/23/2022, 1:27 PM   If 7PM-7AM, please contact night-coverage [email or web address]

## 2022-09-23 NOTE — Progress Notes (Signed)
Patient returned from HD today with CBG of 48. Charge nurse administered D5 due to being on full liquid diet and refusing to drink any juice. Patient CBG recheck 150s. No complications noted at this time. Will continue to monitor.

## 2022-09-23 NOTE — Progress Notes (Signed)
   09/23/22 1205  Vitals  Temp 98.3 F (36.8 C)  Temp Source Oral  BP (!) 152/61  BP Location Right Arm  BP Method Automatic  Patient Position (if appropriate) Lying  Pulse Rate 84  Pulse Rate Source Monitor  Resp 16  Oxygen Therapy  SpO2 100 %  O2 Device Room Air  Pulse Oximetry Type Continuous     09/23/22 1205  Vitals  Temp 98.3 F (36.8 C)  Temp Source Oral  BP (!) 152/61  BP Location Right Arm  BP Method Automatic  Patient Position (if appropriate) Lying  Pulse Rate 84  Pulse Rate Source Monitor  Resp 16  Oxygen Therapy  SpO2 100 %  O2 Device Room Air  Pulse Oximetry Type Continuous   Received patient in bed to unit.  Alert and oriented.  Informed consent signed and in chart.   Treatment initiated: [place] Treatment completed: 1140  Patient tolerated well.  Transported back to the room  Alert, without acute distress.  Hand-off given to patient's nurse.   Access used: HD cath Access issues: NA  Total UF removed: 914ml Medication(s) given:  Albumin 25% 25G IVPB, Heparin Dwell 3800units Post HD VS: see above Post HD weight: 72.5kg   [facility]

## 2022-09-23 NOTE — Progress Notes (Addendum)
[facility] Progress Note   Subjective:    Seen on dialysis, is pretty somnolent but at least will keep him from signing off-  per notes is not eating well -  pre HD labs are not resulted  Objective Vitals:   09/23/22 0900 09/23/22 0930 09/23/22 0932 09/23/22 1000  BP: (!) 109/94 (!) 97/53  120/60  Pulse: 80 78 80 78  Resp: 20 (!) 0 12 (!) 9  Temp:      TempSrc:      SpO2: 99% 99% 99% 100%  Weight:       Physical Exam General: Alert male in NAD Heart: RRR, no murmurs, rubs or gallops Lungs: CTA bilaterally without wheezing, rhonchi or rales Abdomen: Soft, non-distended Extremities: No edema b/l lower extremities Dialysis Access: R IJ TDC-  he also appears to have a left UE AV access-  not mentioned in the OP notes   Filed Weights   09/21/22 1415  Weight: 74 kg    Intake/Output Summary (Last 24 hours) at 09/23/2022 1009 Last data filed at 09/22/2022 1400 Gross per 24 hour  Intake 60 ml  Output --  Net 60 ml    Additional Objective Labs: Basic Metabolic Panel: Recent Labs  Lab 09/19/22 2140 09/20/22 1750 09/21/22 1433  NA 137  --  137  K 5.5*  --  4.9  CL 96*  --  95*  CO2 17*  --  22  GLUCOSE 138*  --  108*  BUN 73*  --  79*  CREATININE 15.11*  --  16.58*  CALCIUM 8.8*  --  8.2*  PHOS  --  10.2* 10.6*   Liver Function Tests: Recent Labs  Lab 09/19/22 2140  AST 32  ALT 16  ALKPHOS 103  BILITOT 0.7  PROT 7.5  ALBUMIN 3.3*   Recent Labs  Lab 09/19/22 2140  LIPASE 507*   CBC: Recent Labs  Lab 09/19/22 2244  WBC 8.8  NEUTROABS 5.9  HGB 13.2  HCT 39.6  MCV 94.1  PLT 255   Blood Culture    Component Value Date/Time   SDES BLOOD RIGHT ANTECUBITAL 09/01/2022 0122   SPECREQUEST  09/01/2022 0122    BOTTLES DRAWN AEROBIC AND ANAEROBIC Blood Culture adequate volume   CULT  09/01/2022 0122    NO GROWTH 5 DAYS Performed at [facility], [place] [address] [place], [place] [address]    REPTSTATUS 09/06/2022 FINAL 09/01/2022  0122    Cardiac Enzymes: No results for input(s): "CKTOTAL", "CKMB", "CKMBINDEX", "TROPONINI" in the last 168 hours. CBG: Recent Labs  Lab 09/22/22 1158 09/22/22 1725 09/22/22 2017 09/23/22 0355 09/23/22 0458  GLUCAP 79 72 84 60* 124*   Iron Studies: No results for input(s): "IRON", "TIBC", "TRANSFERRIN", "FERRITIN" in the last 72 hours. Lab Results  Component Value Date   INR 1.1 07/23/2022   INR 1.1 12/19/2021   INR 1.1 04/13/2020   Studies/Results: No results found.  Medications:  albumin human 25 g (09/23/22 0949)    bictegravir-emtricitabine-tenofovir AF  1 tablet Oral Daily   carvedilol  12.5 mg Oral BID WC   Chlorhexidine Gluconate Cloth  6 each Topical Q0600   cloNIDine  0.1 mg Oral Q8H   clopidogrel  75 mg Oral Daily   diphenhydrAMINE  25-50 mg Intravenous Once in dialysis   doxepin  75 mg Oral QHS   doxercalciferol  3 mcg Intravenous Q T,Th,Sa-HD   heparin  5,000 Units Subcutaneous Q8H   hydrALAZINE  10 mg Oral  Q8H   insulin aspart  0-6 Units Subcutaneous Q4H   levETIRAcetam  500 mg Oral BID   pantoprazole  40 mg Oral Daily   rosuvastatin  5 mg Oral Daily   tamsulosin  0.4 mg Oral Daily    Dialysis Orders: [facility]  4h  400/800  70kg  2/2 bath TDC  Heparin 1600  - last HD 12/28, post wt 73.3kg, signing off early last 3 hd - hectorol 3 ug tiw  - venofer 50 weekly - mircera 200 q2, last 12/07, due 12/21  - last Hb 10.3 on 11/28     Na 137  K 5.5  CO2 17  AG 24  BUN 73  creat 15  Ca 8.8, alb 3.3    AST / ALT / tbili wnl   Hb 13.2 WBC 8K      CT abd - no acute findings, +renal cyst and non-obstructing renal stone x 1, normal pancreas  Assessment/Plan: Abdominal pain - w/ N/V/D. CT abd negative. Suspected to be acute pancreatitis. Per pmd ESRD - on HD TTS. Pt poor compliance w/ OP HD ,signs off early and often doesn't come to his Saturday dialysis. Creat high here.  Saturday treatment was delayed due to high census/low staff. Received HD 12/31. Next  HD today on schedule. HTN/ volume - BP up a bit, supposedly taking coreg/ clonidine/ hydralazine at home. No vol excess on exam. Moderate UFG of 2 -2.5 L w/ next HD.  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 Missed last OP esa dose on 12/21 but will hold for now with last hb >12 MBD ckd - CCa in range, phos 6.8. Cont IV vdra. Resume binders once tolerating PO's-  not yet.  Hyperkalemia - mild, patient has been refusing labs-  supposedly were drawn pre HD but not resulted yet.   [facility] 09/23/2022,10:09 AM  LOS: 3 days

## 2022-09-23 NOTE — Procedures (Signed)
Patient was seen on dialysis and the procedure was supervised.  BFR 400  Via TDC BP is  120/60.   Patient appears to be tolerating treatment well  Louis Meckel 09/23/2022

## 2022-09-23 NOTE — Progress Notes (Signed)
Pt receives out-pt HD at [facility] on TTS. Appears pt admitted from snf. Will assist as needed.   Melven Sartorius Renal Navigator [phone]

## 2022-09-24 DIAGNOSIS — I10 Essential (primary) hypertension: Secondary | ICD-10-CM | POA: Diagnosis not present

## 2022-09-24 DIAGNOSIS — K859 Acute pancreatitis without necrosis or infection, unspecified: Secondary | ICD-10-CM | POA: Diagnosis not present

## 2022-09-24 DIAGNOSIS — E8729 Other acidosis: Secondary | ICD-10-CM | POA: Diagnosis not present

## 2022-09-24 DIAGNOSIS — N186 End stage renal disease: Secondary | ICD-10-CM | POA: Diagnosis not present

## 2022-09-24 LAB — GLUCOSE, CAPILLARY
Glucose-Capillary: 97 mg/dL (ref 70–99)
Glucose-Capillary: 108 mg/dL — ABNORMAL HIGH (ref 70–99)
Glucose-Capillary: 108 mg/dL — ABNORMAL HIGH (ref 70–99)
Glucose-Capillary: 209 mg/dL — ABNORMAL HIGH (ref 70–99)
Glucose-Capillary: 60 mg/dL — ABNORMAL LOW (ref 70–99)
Glucose-Capillary: 111 mg/dL — ABNORMAL HIGH (ref 70–99)

## 2022-09-24 LAB — HEPATITIS B SURFACE ANTIGEN: Hepatitis B Surface Ag: NONREACTIVE

## 2022-09-24 MED ORDER — DEXTROSE 50 % IV SOLN
INTRAVENOUS | Status: AC
  Administered 2022-09-24: 50 mL
  Filled 2022-09-24: qty 50

## 2022-09-24 MED ORDER — CLONIDINE HCL 0.1 MG PO TABS: 0.1000 mg | ORAL_TABLET | Freq: Two times a day (BID) | ORAL | Status: DC

## 2022-09-24 MED ORDER — CHLORHEXIDINE GLUCONATE CLOTH 2 % EX PADS: 6.0000 | MEDICATED_PAD | Freq: Every day | CUTANEOUS | Status: DC

## 2022-09-24 NOTE — Progress Notes (Signed)
PROGRESS NOTE    Richard [name]  JKD:[id number] DOB: Sep 23, 1955 DOA: 09/19/2022 PCP: Jodi Marble, MD   Brief Narrative: Richard [name] is a 67 y.o. male with past medical history of ESRD on TTS hemodialysis, stroke with resultant right hemiparesis and dysarthria,, diabetes mellitus type 2, seizures and hypertension presented to hospital with abdominal pain.  Labs showed lipase at 507 but CT scan was negative for pancreatitis.  Patient was clinically thought to have acute pancreatitis and was admitted hospital for further evaluation and treatment.  Assessment and Plan:  Acute pancreatitis Patient had abdominal pain with elevated lipase at 507.  CT abdomen was negative for pancreatic inflammation.  Has been recurrent issue for the patient.  Patient has been started on full liquid diet has not been eating much.  Still complains of epigastric pain and nausea but difficult understand his speech.  Uncertain etiology but could be secondary to lamivudine.  Has not been eating much.  Will continue full liquids for today.  High anion gap acidosis Improved.  Continue hemodialysis.  ESRD Nephrology has been consulted for hemodialysis while in the hospital.   Essential hypertension Continue clonidine, hydralazine and Coreg with holding parameters.  Blood pressure marginally low at this time.   History CVA chronic right hemiparesis. Supportive care.   HIV disease History of AIDS. Most recent CD4 count of 357 from 08/2022. Patient is on Dovato as an outpatient which was switched back to [place] secondary to concern that Dovato could possibly be contributing to pancreatitis.  Continue Biktarvy.   Seizure disorder -Continue Keppra 500 mg BID   Diabetes mellitus, type 2 Controlled.  Latest Hemoglobin A1C of 6.5%. Continue SSI   GERD Continue PPI.   Hyperlipidemia -Continue Crestor  DVT prophylaxis: Heparin subq  Code Status:   Code Status: Full Code  Family  Communication: None at bedside  Disposition Plan:  Patient is from skilled nursing facility.  Discharge in 1 to 2 days when able to tolerate oral diet.    Consultants:  Nephrology  Procedures:  Hemodialysis  Antimicrobials: None    Subjective: Today, patient was seen and examined at bedside.  Complains of nausea and epigastric pain.  As per the nurse did not eat much.  Objective: BP (!) 97/47   Pulse 70   Temp 98 F (36.7 C) (Oral)   Resp 13   Wt 72.5 kg   SpO2 99%   BMI 21.68 kg/m   General:  Average built, not in obvious distress dysarthric and difficult to understand speech. HENT:   No scleral pallor or icterus noted. Oral mucosa is moist.  Chest:  Clear breath sounds.  Diminished breath sounds bilaterally. No crackles or wheezes.  CVS: S1 &S2 heard. No murmur.  Regular rate and rhythm. Abdomen: Soft, tenderness over the epigastric region, nondistended.  Bowel sounds are heard.   Extremities: No cyanosis, clubbing or edema.  Peripheral pulses are palpable.  Right upper extremity contracture, Psych: Alert, awake and negative, dysarthric, CNS: Diarrhea, right hemiaplasia, alert awake, right upper extremity contracture. Skin: Warm and dry.  No rashes noted.   Data Reviewed: I have personally reviewed following labs and imaging studies  CBC Lab Results  Component Value Date   WBC 6.5 09/23/2022   RBC 3.71 (L) 09/23/2022   HGB 11.4 (L) 09/23/2022   HCT 36.5 (L) 09/23/2022   MCV 98.4 09/23/2022   MCH 30.7 09/23/2022   PLT 232 09/23/2022   MCHC 31.2 09/23/2022   RDW 15.0 09/23/2022  LYMPHSABS 1.6 09/19/2022   MONOABS 1.0 09/19/2022   EOSABS 0.2 09/19/2022   BASOSABS 0.1 21/74/7159     Last metabolic panel Lab Results  Component Value Date   NA 132 (L) 09/23/2022   K 4.2 09/23/2022   CL 93 (L) 09/23/2022   CO2 26 09/23/2022   BUN 43 (H) 09/23/2022   CREATININE 10.92 (H) 09/23/2022   GLUCOSE 84 09/23/2022   GFRNONAA 5 (L) 09/23/2022   GFRAA 7 (L)  06/02/2020   CALCIUM 8.5 (L) 09/23/2022   PHOS 8.0 (H) 09/23/2022   PROT 7.5 09/19/2022   ALBUMIN 2.8 (L) 09/23/2022   BILITOT 0.7 09/19/2022   ALKPHOS 103 09/19/2022   AST 32 09/19/2022   ALT 16 09/19/2022   ANIONGAP 13 09/23/2022    GFR: Estimated Creatinine Clearance: 6.9 mL/min (A) (by C-G formula based on SCr of 10.92 mg/dL (H)).  No results found for this or any previous visit (from the past 240 hour(s)).    Radiology Studies: No results found.    LOS: 4 days    Flora Lipps, MD [facility] 09/24/2022, 9:14 AM If 7PM-7AM, please contact night-coverage [email or web address]

## 2022-09-24 NOTE — Progress Notes (Signed)
[facility] Progress Note   Subjective:    Last HD on 1/2 with 0.9 kg UF.  Looks like he got clonidine this AM with BP dropping to 90's after that.  States doesn't have an IV for dialysis yet this is actively in his chest.  He then states that they have just used his fistula one time.   Review of systems:  Limited secondary to patient factors - poor historian  States having nausea  Hungry Not eating food - see is ordered for clears   Objective Vitals:   09/24/22 0644 09/24/22 0645 09/24/22 0818 09/24/22 0818  BP: (!) 139/55 (!) 139/55 (!) 97/47 (!) 97/47  Pulse:   70 70  Resp:      Temp:      TempSrc:      SpO2:   99% 99%  Weight:       Physical Exam  General adult male in bed in no acute distress HEENT normocephalic atraumatic extraocular movements intact sclera anicteric Neck supple trachea midline Lungs clear to auscultation bilaterally normal work of breathing at rest on room air Heart S1S2 no rub Abdomen soft nontender on my exam; nondistended Extremities no edema  Psych no anxiety or agitation  Neuro - dysarthria; provides name, gives year as 2023 despite two guesses Access RIJ tunn catheter in place; LUE AVF bruit and thrill     Filed Weights   09/21/22 1415 09/23/22 0747 09/23/22 1205  Weight: 74 kg 71.4 kg 72.5 kg    Intake/Output Summary (Last 24 hours) at 09/24/2022 1035 Last data filed at 09/23/2022 1140 Gross per 24 hour  Intake --  Output 900 ml  Net -900 ml    Additional Objective Labs: Basic Metabolic Panel: Recent Labs  Lab 09/19/22 2140 09/20/22 1750 09/21/22 1433 09/23/22 1026  NA 137  --  137 132*  K 5.5*  --  4.9 4.2  CL 96*  --  95* 93*  CO2 17*  --  22 26  GLUCOSE 138*  --  108* 84  BUN 73*  --  79* 43*  CREATININE 15.11*  --  16.58* 10.92*  CALCIUM 8.8*  --  8.2* 8.5*  PHOS  --  10.2* 10.6* 8.0*   Liver Function Tests: Recent Labs  Lab 09/19/22 2140 09/23/22 1026  AST 32  --   ALT 16  --   ALKPHOS 103   --   BILITOT 0.7  --   PROT 7.5  --   ALBUMIN 3.3* 2.8*   Recent Labs  Lab 09/19/22 2140  LIPASE 507*   CBC: Recent Labs  Lab 09/19/22 2244 09/23/22 1026  WBC 8.8 6.5  NEUTROABS 5.9  --   HGB 13.2 11.4*  HCT 39.6 36.5*  MCV 94.1 98.4  PLT 255 232   Blood Culture    Component Value Date/Time   SDES BLOOD RIGHT ANTECUBITAL 09/01/2022 0122   SPECREQUEST  09/01/2022 0122    BOTTLES DRAWN AEROBIC AND ANAEROBIC Blood Culture adequate volume   CULT  09/01/2022 0122    NO GROWTH 5 DAYS Performed at [facility], [address] [place], [place] [address]    REPTSTATUS 09/06/2022 FINAL 09/01/2022 0122    Cardiac Enzymes: No results for input(s): "CKTOTAL", "CKMB", "CKMBINDEX", "TROPONINI" in the last 168 hours. CBG: Recent Labs  Lab 09/23/22 1602 09/23/22 1954 09/23/22 2209 09/24/22 0633 09/24/22 0820  GLUCAP 109* 66* 144* 97 108*   Iron Studies: No results for input(s): "IRON", "TIBC", "TRANSFERRIN", "FERRITIN"  in the last 72 hours. Lab Results  Component Value Date   INR 1.1 07/23/2022   INR 1.1 12/19/2021   INR 1.1 04/13/2020   Studies/Results: No results found.  Medications:  albumin human 25 g (09/23/22 0949)   anticoagulant sodium citrate      bictegravir-emtricitabine-tenofovir AF  1 tablet Oral Daily   carvedilol  12.5 mg Oral BID WC   Chlorhexidine Gluconate Cloth  6 each Topical Q0600   cloNIDine  0.1 mg Oral Q8H   clopidogrel  75 mg Oral Daily   diphenhydrAMINE  25-50 mg Intravenous Once in dialysis   doxepin  75 mg Oral QHS   doxercalciferol  3 mcg Intravenous Q T,Th,Sa-HD   heparin  5,000 Units Subcutaneous Q8H   hydrALAZINE  10 mg Oral Q8H   insulin aspart  0-6 Units Subcutaneous Q4H   levETIRAcetam  500 mg Oral BID   pantoprazole  40 mg Oral Daily   rosuvastatin  5 mg Oral Daily   tamsulosin  0.4 mg Oral Daily    Dialysis Orders: [facility]  4h  400/800  70kg  2/2 bath TDC  Heparin 1600  - last HD 12/28, post wt 73.3kg,  signing off early last 3 hd - hectorol 3 ug tiw  - venofer 50 weekly - mircera 200 q2, last 12/07, due 12/21  - last Hb 10.3 on 11/28     Na 137  K 5.5  CO2 17  AG 24  BUN 73  creat 15  Ca 8.8, alb 3.3    AST / ALT / tbili wnl   Hb 13.2 WBC 8K      CT abd - no acute findings, +renal cyst and non-obstructing renal stone x 1, normal pancreas  Assessment/Plan: Abdominal pain - w/ N/V/D. CT abd negative. Suspected to be acute pancreatitis. Per primary team  ESRD - on HD TTS. Pt poor compliance w/ OP HD ,signs off early and often doesn't come to his Saturday dialysis. Note recently his Saturday treatment was delayed due to high census/low staff. HD per TTS  schedule  HTN/ volume - concern for medication noncompliance at home. No vol excess on exam.  Parameters were placed on clonidine due to hypotension.  On med hx review he has usually been getting once or at most twice a day and is ordered TID.  Will stop for now.  Would be great if did not need to take it Anemia esrd - Hb 10-13 here, follow. Missed last OP esa dose on 12/21 but will hold for now with last hb 11.4.  MBD ckd - Cont IV vdra. hyperphosphatemia - resume binders once tolerating PO's-  not yet. phoslo listed as home med  Hyperkalemia - resolved    Disposition - per primary team   Claudia Desanctis, MD [facility] 09/24/2022,11:03 AM  LOS: 4 days

## 2022-09-24 NOTE — Plan of Care (Signed)
  Problem: Education: Goal: Ability to describe self-care measures that may prevent or decrease complications (Diabetes Survival Skills Education) will improve Outcome: Progressing   Problem: Coping: Goal: Level of anxiety will decrease Outcome: Progressing   Problem: Pain Managment: Goal: General experience of comfort will improve Outcome: Progressing

## 2022-09-24 NOTE — Care Management Important Message (Signed)
Important Message  Patient Details  Name: Richard [name] MRN: [id number] Date of Birth: 03-12-1956   Medicare Important Message Given:  Yes     Hannah Beat 09/24/2022, 11:05 AM

## 2022-09-25 DIAGNOSIS — K859 Acute pancreatitis without necrosis or infection, unspecified: Secondary | ICD-10-CM | POA: Diagnosis not present

## 2022-09-25 DIAGNOSIS — N186 End stage renal disease: Secondary | ICD-10-CM | POA: Diagnosis not present

## 2022-09-25 DIAGNOSIS — Z992 Dependence on renal dialysis: Secondary | ICD-10-CM | POA: Diagnosis not present

## 2022-09-25 LAB — RENAL FUNCTION PANEL
Albumin: 3 g/dL — ABNORMAL LOW (ref 3.5–5.0)
Anion gap: 14 (ref 5–15)
BUN: 23 mg/dL (ref 8–23)
CO2: 26 mmol/L (ref 22–32)
Calcium: 8.5 mg/dL — ABNORMAL LOW (ref 8.9–10.3)
Chloride: 91 mmol/L — ABNORMAL LOW (ref 98–111)
Creatinine, Ser: 8.07 mg/dL — ABNORMAL HIGH (ref 0.61–1.24)
GFR, Estimated: 7 mL/min — ABNORMAL LOW (ref >60)
Glucose, Bld: 137 mg/dL — ABNORMAL HIGH (ref 70–99)
Phosphorus: 5.8 mg/dL — ABNORMAL HIGH (ref 2.5–4.6)
Potassium: 3.6 mmol/L (ref 3.5–5.1)
Sodium: 131 mmol/L — ABNORMAL LOW (ref 135–145)

## 2022-09-25 LAB — CBC
HCT: 36.8 % — ABNORMAL LOW (ref 39.0–52.0)
Hemoglobin: 11.7 g/dL — ABNORMAL LOW (ref 13.0–17.0)
MCH: 30.9 pg (ref 26.0–34.0)
MCHC: 31.8 g/dL (ref 30.0–36.0)
MCV: 97.1 fL (ref 80.0–100.0)
Platelets: 192 10*3/uL (ref 150–400)
RBC: 3.79 MIL/uL — ABNORMAL LOW (ref 4.22–5.81)
RDW: 15 % (ref 11.5–15.5)
WBC: 7.6 10*3/uL (ref 4.0–10.5)
nRBC: 0 % (ref 0.0–0.2)

## 2022-09-25 LAB — GLUCOSE, CAPILLARY
Glucose-Capillary: 108 mg/dL — ABNORMAL HIGH (ref 70–99)
Glucose-Capillary: 104 mg/dL — ABNORMAL HIGH (ref 70–99)
Glucose-Capillary: 137 mg/dL — ABNORMAL HIGH (ref 70–99)
Glucose-Capillary: 134 mg/dL — ABNORMAL HIGH (ref 70–99)

## 2022-09-25 LAB — MAGNESIUM: Magnesium: 3.3 mg/dL — ABNORMAL HIGH (ref 1.7–2.4)

## 2022-09-25 LAB — LIPASE, BLOOD: Lipase: 28 U/L (ref 11–51)

## 2022-09-25 MED ORDER — CALCIUM ACETATE (PHOS BINDER) 667 MG PO CAPS
2001.0000 mg | ORAL_CAPSULE | Freq: Three times a day (TID) | ORAL | Status: DC
  Administered 2022-09-25 – 2022-09-26 (×4): 2001 mg via ORAL
  Filled 2022-09-25 (×4): qty 3

## 2022-09-25 MED ORDER — CARVEDILOL 6.25 MG PO TABS
6.2500 mg | ORAL_TABLET | Freq: Two times a day (BID) | ORAL | Status: DC
  Administered 2022-09-25 – 2022-09-26 (×2): 6.25 mg via ORAL
  Filled 2022-09-25 (×2): qty 1

## 2022-09-25 MED ORDER — CALCIUM ACETATE (PHOS BINDER) 667 MG PO CAPS: 1334.0000 mg | ORAL_CAPSULE | Freq: Three times a day (TID) | ORAL | Status: DC

## 2022-09-25 NOTE — Inpatient Diabetes Management (Signed)
Inpatient Diabetes Program Recommendations  AACE/ADA: New Consensus Statement on Inpatient Glycemic Control (2015)  Target Ranges:  Prepandial:   less than 140 mg/dL      Peak postprandial:   less than 180 mg/dL (1-2 hours)      Critically ill patients:  140 - 180 mg/dL   Lab Results  Component Value Date   GLUCAP 104 (H) 09/25/2022   HGBA1C 6.5 (H) 09/19/2022    Review of Glycemic Control   Latest Reference Range & Units 09/24/22 16:41 09/24/22 20:32 09/24/22 23:22 09/25/22 06:52 09/25/22 12:10  Glucose-Capillary 70 - 99 mg/dL 209 (H) 60 (L) 111 (H) 108 (H) 104 (H)  (H): Data is abnormally high (L): Data is abnormally low    Noted hypoglycemia yesterday following Novolog 2 units from sliding scale.  Of note, correction was given >1 hour following the CBG.   Consider changing correction to TID & HS.   Thanks, Bronson Curb, MSN, RNC-OB Diabetes Coordinator [phone] (8a-5p)

## 2022-09-25 NOTE — Progress Notes (Signed)
   09/25/22 0555  Vitals  Temp 97.8 F (36.6 C)  Temp Source Oral  BP 136/79  BP Location Left Arm  BP Method Automatic  Patient Position (if appropriate) Lying  Pulse Rate 92  Pulse Rate Source Monitor  ECG Heart Rate 89  Resp 20  Oxygen Therapy  SpO2 95 %  O2 Device Room Air  During Treatment Monitoring  Intra-Hemodialysis Comments Tx completed  Post Treatment  Dialyzer Clearance Lightly streaked  Duration of HD Treatment -hour(s) 3.5 hour(s)  Liters Processed 79.4  Fluid Removed (mL) 1500 mL (1.5 liters)  Tolerated HD Treatment No (Comment)  Post-Hemodialysis Comments tx complete, pt stable, unable to meet goal due to hypotension with SBP dropping into 80s-low 90s, UF paused, goal lowered, albumin 25 gm given x1 with partial effects. Clotted tmp sensor, with change of cartridge, unable to pull back additional fluid. Pt restless at times.  Hemodialysis Catheter Right Internal jugular Double lumen Permanent (Tunneled)  Placement Date/Time: 01/31/22 1543   Placed prior to admission: No  Time Out: Correct patient;Correct site;Correct procedure  Maximum sterile barrier precautions: Hand hygiene;Cap;Mask;Sterile gown;Sterile gloves;Large sterile sheet  Site Prep: Chlorh...  Site Condition No complications  Blue Lumen Status Flushed;Dead end cap in place;Heparin locked  Red Lumen Status Flushed;Dead end cap in place;Heparin locked  Catheter fill solution Heparin 1000 units/ml  Catheter fill volume (Arterial) 1.9 cc  Catheter fill volume (Venous) 1.9  Dressing Type Transparent  Dressing Status Clean, Dry, Intact;Antimicrobial disc in place  Drainage Description None  Dressing Change Due 09/28/22  Post treatment catheter status Capped and Clamped

## 2022-09-25 NOTE — Progress Notes (Signed)
[facility] Progress Note   Subjective:    Last HD on 1/4 early AM with 1.5 kg UF. He is about to get up with PT.  BP 100/58 prior to working with PT.  Note he now has a renal diet ordered. Previously stated AVF "only used once"  Review of systems:    Limited secondary to patient factors - poor historian  Denies shortness of breath  Hx nausea - eating some  Objective Vitals:   09/25/22 0527 09/25/22 0555 09/25/22 0600 09/25/22 0656  BP: 125/77 136/79  (!) 116/31  Pulse: 81 92  92  Resp: 13 20    Temp:  97.8 F (36.6 C)  98.6 F (37 C)  TempSrc:  Oral  Oral  SpO2: 99% 95%  94%  Weight:   67.7 kg    Physical Exam   General adult male in bed in no acute distress HEENT normocephalic atraumatic extraocular movements intact sclera anicteric Neck supple trachea midline Lungs clear to auscultation bilaterally normal work of breathing at rest on room air Heart S1S2 no rub Abdomen soft nontender on my exam; nondistended Extremities no edema  Psych no anxiety or agitation  Neuro - dysarthria; provides name and is more conversant today Access RIJ tunn catheter in place; LUE AVF bruit and thrill     Filed Weights   09/23/22 1205 09/25/22 0133 09/25/22 0600  Weight: 72.5 kg 69.5 kg 67.7 kg    Intake/Output Summary (Last 24 hours) at 09/25/2022 1058 Last data filed at 09/25/2022 0555 Gross per 24 hour  Intake --  Output 1500 ml  Net -1500 ml    Additional Objective Labs: Basic Metabolic Panel: Recent Labs  Lab 09/21/22 1433 09/23/22 1026 09/25/22 0146  NA 137 132* 131*  K 4.9 4.2 3.6  CL 95* 93* 91*  CO2 22 26 26   GLUCOSE 108* 84 137*  BUN 79* 43* 23  CREATININE 16.58* 10.92* 8.07*  CALCIUM 8.2* 8.5* 8.5*  PHOS 10.6* 8.0* 5.8*   Liver Function Tests: Recent Labs  Lab 09/19/22 2140 09/23/22 1026 09/25/22 0146  AST 32  --   --   ALT 16  --   --   ALKPHOS 103  --   --   BILITOT 0.7  --   --   PROT 7.5  --   --   ALBUMIN 3.3* 2.8* 3.0*    Recent Labs  Lab 09/19/22 2140 09/25/22 0146  LIPASE 507* 28   CBC: Recent Labs  Lab 09/19/22 2244 09/23/22 1026 09/25/22 0146  WBC 8.8 6.5 7.6  NEUTROABS 5.9  --   --   HGB 13.2 11.4* 11.7*  HCT 39.6 36.5* 36.8*  MCV 94.1 98.4 97.1  PLT 255 232 192   Blood Culture    Component Value Date/Time   SDES BLOOD RIGHT ANTECUBITAL 09/01/2022 0122   SPECREQUEST  09/01/2022 0122    BOTTLES DRAWN AEROBIC AND ANAEROBIC Blood Culture adequate volume   CULT  09/01/2022 0122    NO GROWTH 5 DAYS Performed at [facility], [address] [place], [place] [address]    REPTSTATUS 09/06/2022 FINAL 09/01/2022 0122    Cardiac Enzymes: No results for input(s): "CKTOTAL", "CKMB", "CKMBINDEX", "TROPONINI" in the last 168 hours. CBG: Recent Labs  Lab 09/24/22 1302 09/24/22 1641 09/24/22 2032 09/24/22 2322 09/25/22 0652  GLUCAP 108* 209* 60* 111* 108*   Iron Studies: No results for input(s): "IRON", "TIBC", "TRANSFERRIN", "FERRITIN" in the last 72 hours. Lab Results  Component  Value Date   INR 1.1 07/23/2022   INR 1.1 12/19/2021   INR 1.1 04/13/2020   Studies/Results: No results found.  Medications:    bictegravir-emtricitabine-tenofovir AF  1 tablet Oral Daily   carvedilol  12.5 mg Oral BID WC   Chlorhexidine Gluconate Cloth  6 each Topical Q0600   clopidogrel  75 mg Oral Daily   diphenhydrAMINE  25-50 mg Intravenous Once in dialysis   doxepin  75 mg Oral QHS   doxercalciferol  3 mcg Intravenous Q T,Th,Sa-HD   heparin  5,000 Units Subcutaneous Q8H   hydrALAZINE  10 mg Oral Q8H   insulin aspart  0-6 Units Subcutaneous Q4H   levETIRAcetam  500 mg Oral BID   pantoprazole  40 mg Oral Daily   rosuvastatin  5 mg Oral Daily   tamsulosin  0.4 mg Oral Daily    Dialysis Orders: [facility]  4h  400/800  70kg  2/2 bath TDC  Heparin 1600  - last HD 12/28, post wt 73.3kg, signing off early last 3 hd - hectorol 3 ug tiw  - venofer 50 weekly - mircera 200 q2, last  12/07, due 12/21  - last Hb 10.3 on 11/28     Na 137  K 5.5  CO2 17  AG 24  BUN 73  creat 15  Ca 8.8, alb 3.3    AST / ALT / tbili wnl   Hb 13.2 WBC 8K      CT abd - no acute findings, +renal cyst and non-obstructing renal stone x 1, normal pancreas  Assessment/Plan: Abdominal pain - w/ N/V/D. CT abd negative. Suspected to be acute pancreatitis. Per primary team  ESRD - on HD per TTS schedule. (Last treatment was 1/4 early AM). Pt poor compliance w/ OP HD ,signs off early and is noncompliant, frequently misses HD completely on Saturdays.    HTN/ volume - concern for medication noncompliance at home. No vol excess on exam.  Parameters were placed on clonidine due to hypotension.  On med hx review he has usually been getting once or at most twice a day and is ordered TID.  We have stopped as he is likely noncompliant so not the ideal medication for him.  Please discharge on his current anti-hypertensive regimen and please do not resume clonidine Anemia esrd - Hb acceptable MBD ckd - Cont hectorol. hyperphosphatemia.  Resume phoslo  Hyperkalemia - resolved    Disposition - per primary team   Claudia Desanctis, MD [facility] 09/25/2022,11:14 AM  LOS: 5 days

## 2022-09-25 NOTE — Progress Notes (Addendum)
Mobility Specialist Progress Note   09/25/22 1127  Mobility  Activity Transferred from bed to chair  Level of Assistance +2 (takes two people) (ModA)  Information systems manager Ambulated (ft) 2 ft  Activity Response Tolerated fair  $Mobility charge 1 Mobility   Pre Mobility: 77 HR, 92/55 BP, 98% SpO2 on RA  During Mobility: 102 HR, 100/58 BP, 87% SpO2 on RA  Post Mobility: 84 HR, 98/61 BP, 100% SpO2 on RA   Intercepted pt in room yelling for help, no apparent danger or harm but pt requesting to get OOB. Transfer limited by R sided weakness, dysphasia, general deconditioning and delayed motor control. +2A ModA EOB > Stand > pivot pt, BLE weakness R>L when standing + VC needed for sequencing. Left in chair w/o fault call bell in reach and chair alarm on.   Holland Falling Mobility Specialist Please contact via SecureChat or  Rehab office at [phone]

## 2022-09-25 NOTE — Progress Notes (Signed)
Mobility Specialist Progress Note   09/25/22 1405  Mobility  Activity Transferred from chair to bed  Level of Assistance +2 (takes two people) ([place])  Assistive Device Front wheel walker  Distance Ambulated (ft) 2 ft  Activity Response Tolerated fair  Mobility Referral Yes  $Mobility charge 1 Mobility   RN requesting assistance to get pt back in bed d/t uncomfortable positioning in chair. +2A ModA to sit pt upright then stand. BLE's having limited motor control and requiring tactile cues to pivot. Retunrned to bed w/o fault, call bell in reach and needs met.   Holland Falling Mobility Specialist Please contact via SecureChat or  Rehab office at [phone]

## 2022-09-25 NOTE — Progress Notes (Signed)
Patient to HD 

## 2022-09-25 NOTE — Progress Notes (Signed)
Progress Note    Richard [name]   CHY:[id number]  DOB: 1956-04-23  DOA: 09/19/2022     5 PCP: Jodi Marble, MD  Initial CC: Abdominal pain  Hospital Course: Richard [name] is a 67 y.o. 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 Patient presented secondary to abdominal pain and found to have a lipase of 507 with negative CT findings of pancreatitis. Patient made NPO and started on analgesics for pain management. Diet slowly advanced.  Interval History:  No events overnight.  Tolerating liquid diet and wishing to advance further today.  Assessment and Plan:  Acute pancreatitis Patient had abdominal pain with elevated lipase at 507.  CT abdomen was negative for pancreatic inflammation.  Has been recurrent issue for the patient.  -Suspected etiology on admission was possibly due to limited bleeding and he has been transitioned to [place] liquids and wishing to further advance today -Start renal diet   High anion gap acidosis Improved.  Continue hemodialysis.   ESRD -Continue on dialysis as per nephrology   Essential hypertension -Blood pressure has been soft.  He has been on Coreg, hydralazine -going to lower coreg dose some to allow more BP room with HD -Agree with not resuming clonidine at discharge   History CVA chronic right hemiparesis. Supportive care.   HIV disease History of AIDS. Most recent CD4 count of 357 from 08/2022. Patient is on Dovato as an outpatient which was switched back to [place] secondary to concern that Dovato could possibly be contributing to pancreatitis.  Continue Biktarvy.   Seizure disorder -Continue Keppra 500 mg BID   Diabetes mellitus, type 2 Controlled.  Latest Hemoglobin A1C of 6.5%. Continue SSI   GERD Continue PPI.   Hyperlipidemia -Continue Crestor    Old records  reviewed in assessment of this patient    DVT prophylaxis:  heparin injection 5,000 Units Start: 09/20/22 1400   Code Status:   Code Status: Full Code  Mobility Assessment (last 72 hours)     Mobility Assessment     Row Name 09/24/22 1100 09/22/22 2001         Does patient have an order for bedrest or is patient medically unstable No - Continue assessment No - Continue assessment      What is the highest level of mobility based on the progressive mobility assessment? Level 2 ([place]) - Balance while sitting on edge of bed and cannot stand Level 2 ([place]) - Balance while sitting on edge of bed and cannot stand      Is the above level different from baseline mobility prior to current illness? Yes - Recommend PT order Yes - Recommend PT order               Barriers to discharge:  Disposition Plan:  Home Friday Status is: Inpt  Objective: Blood pressure (!) 116/31, pulse 92, temperature 98.6 F (37 C), temperature source Oral, resp. rate 20, weight 67.7 kg, SpO2 94 %.  Examination:  Physical Exam Constitutional:      General: He is not in acute distress.    Appearance: He is well-developed.  HENT:     Head: Normocephalic and atraumatic.     Mouth/Throat:     Mouth: Mucous membranes are moist.  Cardiovascular:     Rate and Rhythm: Normal rate and regular rhythm.  Pulmonary:     Effort: Pulmonary effort  is normal.     Breath sounds: Normal breath sounds.  Abdominal:     General: Bowel sounds are normal. There is no distension.     Palpations: Abdomen is soft.     Tenderness: There is no abdominal tenderness.  Musculoskeletal:        General: Normal range of motion.     Cervical back: Normal range of motion and neck supple.  Skin:    General: Skin is warm.  Neurological:     Mental Status: He is alert. Mental status is at baseline.      Consultants:  Nephrology  Procedures:    Data Reviewed: Results for orders placed or performed during the  hospital encounter of 09/19/22 (from the past 24 hour(s))  Glucose, capillary     Status: Abnormal   Collection Time: 09/24/22  4:41 PM  Result Value Ref Range   Glucose-Capillary 209 (H) 70 - 99 mg/dL  Hepatitis B surface antigen     Status: None   Collection Time: 09/24/22  7:54 PM  Result Value Ref Range   Hepatitis B Surface Ag NON REACTIVE NON REACTIVE  Glucose, capillary     Status: Abnormal   Collection Time: 09/24/22  8:32 PM  Result Value Ref Range   Glucose-Capillary 60 (L) 70 - 99 mg/dL  Glucose, capillary     Status: Abnormal   Collection Time: 09/24/22 11:22 PM  Result Value Ref Range   Glucose-Capillary 111 (H) 70 - 99 mg/dL  Renal function panel     Status: Abnormal   Collection Time: 09/25/22  1:46 AM  Result Value Ref Range   Sodium 131 (L) 135 - 145 mmol/L   Potassium 3.6 3.5 - 5.1 mmol/L   Chloride 91 (L) 98 - 111 mmol/L   CO2 26 22 - 32 mmol/L   Glucose, Bld 137 (H) 70 - 99 mg/dL   BUN 23 8 - 23 mg/dL   Creatinine, Ser 8.07 (H) 0.61 - 1.24 mg/dL   Calcium 8.5 (L) 8.9 - 10.3 mg/dL   Phosphorus 5.8 (H) 2.5 - 4.6 mg/dL   Albumin 3.0 (L) 3.5 - 5.0 g/dL   GFR, Estimated 7 (L) >60 mL/min   Anion gap 14 5 - 15  CBC     Status: Abnormal   Collection Time: 09/25/22  1:46 AM  Result Value Ref Range   WBC 7.6 4.0 - 10.5 K/uL   RBC 3.79 (L) 4.22 - 5.81 MIL/uL   Hemoglobin 11.7 (L) 13.0 - 17.0 g/dL   HCT 36.8 (L) 39.0 - 52.0 %   MCV 97.1 80.0 - 100.0 fL   MCH 30.9 26.0 - 34.0 pg   MCHC 31.8 30.0 - 36.0 g/dL   RDW 15.0 11.5 - 15.5 %   Platelets 192 150 - 400 K/uL   nRBC 0.0 0.0 - 0.2 %  Magnesium     Status: Abnormal   Collection Time: 09/25/22  1:46 AM  Result Value Ref Range   Magnesium 3.3 (H) 1.7 - 2.4 mg/dL  Lipase, blood     Status: None   Collection Time: 09/25/22  1:46 AM  Result Value Ref Range   Lipase 28 11 - 51 U/L  Glucose, capillary     Status: Abnormal   Collection Time: 09/25/22  6:52 AM  Result Value Ref Range   Glucose-Capillary 108 (H)  70 - 99 mg/dL  Glucose, capillary     Status: Abnormal   Collection Time: 09/25/22 12:10 PM  Result Value Ref Range  Glucose-Capillary 104 (H) 70 - 99 mg/dL    I have reviewed pertinent nursing notes, vitals, labs, and images as necessary. I have ordered labwork to follow up on as indicated.  I have reviewed the last notes from staff over past 24 hours. I have discussed patient's care plan and test results with nursing staff, CM/SW, and other staff as appropriate.  Time spent: Greater than 50% of the 55 minute visit was spent in counseling/coordination of care for the patient as laid out in the A&P.   LOS: 5 days   Dwyane Dee, MD [facility] 09/25/2022, 2:27 PM

## 2022-09-25 NOTE — Progress Notes (Signed)
Back to unit from HD.

## 2022-09-26 DIAGNOSIS — Z992 Dependence on renal dialysis: Secondary | ICD-10-CM | POA: Diagnosis not present

## 2022-09-26 DIAGNOSIS — N186 End stage renal disease: Secondary | ICD-10-CM | POA: Diagnosis not present

## 2022-09-26 DIAGNOSIS — K859 Acute pancreatitis without necrosis or infection, unspecified: Secondary | ICD-10-CM | POA: Diagnosis not present

## 2022-09-26 DIAGNOSIS — I10 Essential (primary) hypertension: Secondary | ICD-10-CM | POA: Diagnosis not present

## 2022-09-26 LAB — GLUCOSE, CAPILLARY
Glucose-Capillary: 126 mg/dL — ABNORMAL HIGH (ref 70–99)
Glucose-Capillary: 190 mg/dL — ABNORMAL HIGH (ref 70–99)

## 2022-09-26 LAB — HEPATITIS B SURFACE ANTIBODY, QUANTITATIVE: Hep B S AB Quant (Post): <3.1 m[IU]/mL — ABNORMAL LOW (ref >9.9)

## 2022-09-26 MED ORDER — BICTEGRAVIR-EMTRICITAB-TENOFOV 50-200-25 MG PO TABS: 1.0000 | ORAL_TABLET | Freq: Every day | ORAL | Status: DC

## 2022-09-26 MED ORDER — OXYCODONE HCL 5 MG PO TABS
5.0000 mg | ORAL_TABLET | ORAL | Status: DC | PRN
  Administered 2022-09-26: 5 mg via ORAL
  Filled 2022-09-26: qty 1

## 2022-09-26 MED ORDER — OXYCODONE HCL 5 MG PO TABS: 5.0000 mg | ORAL_TABLET | ORAL | 0 refills | Status: DC | PRN

## 2022-09-26 MED ORDER — CARVEDILOL 6.25 MG PO TABS: 6.2500 mg | ORAL_TABLET | Freq: Two times a day (BID) | ORAL | Status: DC

## 2022-09-26 NOTE — Progress Notes (Signed)
EMS arrived to transport pt to [facility] no distress noted all belongings and d/c instructions sent with pt

## 2022-09-26 NOTE — Progress Notes (Signed)
Report given to teresa at [facility]

## 2022-09-26 NOTE — TOC Initial Note (Addendum)
Transition of Care [facility]) - Initial/Assessment Note    Patient Details  Name: Richard [name] MRN: [id number] Date of Birth: 16-Dec-1955  [facility] CM/SW Contact:    Joanne Chars, LCSW Phone Number: 09/26/2022, 10:38 AM  Clinical Narrative:   CSW met with pt for initial assessment.  Pt confirms he lives at [facility] but states he wants to discharge to an apartment.  Some conversation about this, CSW discussed with pt that not sure if this is long term option for pt or not.  Discussed that pt could work towards this with social work Biochemist, clinical at [facility] if it is an option and pt is agreeable this and will return to [facility] at [place] confirmed with [facility] that pt can return, potentially today.  Expected Discharge Plan: Long Term Nursing Home Barriers to Discharge: Continued Medical Work up   Patient Goals and CMS Choice Patient states their goals for this hospitalization and ongoing recovery are:: "live"   Choice offered to / list presented to : Patient      Expected Discharge Plan and Services In-house Referral: Clinical Social Work   Post Acute Care Choice: [place] Living arrangements for the past 2 months: [place] ([place])                                      Prior Living Arrangements/Services Living arrangements for the past 2 months: [place] ([place]) Lives with:: Facility Resident Patient language and need for interpreter reviewed:: Yes Do you feel safe going back to the place where you live?: Yes      Need for Family Participation in Patient Care: No (Comment) Care giver support system in place?: Yes (comment) Current home services: Other (comment) (none) Criminal Activity/Legal Involvement Pertinent to Current Situation/Hospitalization: No - Comment as needed  Activities of Daily Living Home Assistive Devices/Equipment:  Wheelchair ADL Screening (condition at time of admission) Patient's cognitive ability adequate to safely complete daily activities?: Yes Is the patient deaf or have difficulty hearing?: No Does the patient have difficulty seeing, even when wearing glasses/contacts?: No Does the patient have difficulty concentrating, remembering, or making decisions?: No Patient able to express need for assistance with ADLs?: Yes Does the patient have difficulty dressing or bathing?: Yes Independently performs ADLs?: No Communication: Independent Dressing (OT): Needs assistance Is this a change from baseline?: Pre-admission baseline Grooming: Needs assistance Is this a change from baseline?: Pre-admission baseline Feeding: Needs assistance Is this a change from baseline?: Pre-admission baseline Bathing: Needs assistance Is this a change from baseline?: Pre-admission baseline Toileting: Needs assistance Is this a change from baseline?: Pre-admission baseline In/Out Bed: Needs assistance Is this a change from baseline?: Pre-admission baseline Walks in Home: Dependent Is this a change from baseline?: Pre-admission baseline Does the patient have difficulty walking or climbing stairs?: Yes Weakness of Legs: Right Weakness of Arms/Hands: Right  Permission Sought/Granted Permission sought to share information with : Other (comment) (no contacts listed, pt does not identify any contacts)                Emotional Assessment Appearance:: Appears stated age Attitude/Demeanor/Rapport: Engaged Affect (typically observed): Pleasant Orientation: : Oriented to Self, Oriented to Place, Oriented to  Time, Oriented to Situation  Admission diagnosis:  Acute pancreatitis [K85.90] Idiopathic acute pancreatitis without infection or necrosis [K85.00] Patient Active Problem List   Diagnosis Date Noted   Acute pancreatitis 09/20/2022   High anion gap metabolic acidosis 86/76/1950   Renal failure 09/03/2022    Acute respiratory failure with hypoxia ([place]) 09/01/2022   Lack of housing 03/31/2022   Hematoma 03/19/2022   Other disorders of calcium metabolism 03/05/2022   Fall from non-moving wheelchair 10/19/2021   HTN (hypertension) 10/19/2021   Hypermagnesemia 10/19/2021   Allergic rhinitis 10/19/2021   Buttock pain 08/30/2021   Volume overload 04/14/2021   Elevated troponin 02/18/2021   Acute on chronic diastolic CHF (congestive heart failure) ([place]) 02/18/2021   Depression 02/18/2021   Hemorrhoids 01/11/2021   Hyperkalemia 01/10/2021   Dialysis patient, noncompliant    Dependence on supplemental oxygen 12/20/2020   ESRD (end stage renal disease) on dialysis ([place]) 12/20/2020   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 ([place]) 12/20/2020   Human immunodeficiency virus (HIV) disease ([place]) 12/20/2020   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   Syncope 06/01/2020   Secondary hyperparathyroidism of renal origin ([place]) 05/28/2020   Hypokalemia 05/16/2020   Allergy, unspecified, initial encounter 05/08/2020   Anaphylactic shock, unspecified, initial encounter 05/08/2020   Anemia in chronic kidney disease 93/26/7124   Complication of vascular dialysis catheter 05/08/2020   Dependence on renal dialysis ([place]) 05/08/2020   Headache, unspecified 05/08/2020   Iron deficiency anemia, unspecified 05/08/2020   Cerebellar stroke syndrome 05/07/2020   Encounter for feeding tube placement    Weakness    Creatinine elevation    Goals of care, counseling/discussion    Hypertensive urgency    Palliative care by specialist    AIDS (acquired immune deficiency syndrome) ([place]) 04/13/2020   Seizure disorder ([place]) 04/10/2020   History of cerebrovascular accident (CVA) with residual deficit 03/19/2020   Neuropathy in diabetes ([place])    Migraines    GERD (gastroesophageal reflux disease)    Type 2 diabetes mellitus with hyperlipidemia (HCC)    Arthritis    Hyperlipidemia  08/25/2012   Chronic kidney disease, stage 4 (severe) ([place]) 08/23/2012   Tobacco abuse 08/23/2012   Malnutrition of moderate degree ([place]) 08/23/2012   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 05/21/2012   Hypertensive emergency 05/21/2012   PCP:  Jodi Marble, MD Pharmacy:   [place], [place] - [address] [place] [address] Phone: [phone] Fax: [phone]  [place], [place] - [address] [place] [address] Phone: [phone] Fax: [phone]     Social Determinants of Health (SDOH) Social History: [place]: No Food Insecurity (09/20/2022)  Housing: Low Risk  (09/20/2022)  Transportation Needs: No Transportation Needs (09/20/2022)  Utilities: Not At Risk (09/20/2022)  Depression (PHQ2-9): Low Risk  (03/19/2022)  Financial Resource Strain: Low Risk  (07/17/2021)  Physical Activity: Inactive (07/17/2021)  Social Connections: Socially Isolated (07/17/2021)  Stress: Stress Concern Present (07/17/2021)  Tobacco Use: High Risk (09/19/2022)   SDOH Interventions:     Readmission Risk Interventions    10/22/2021   11:56 AM 02/15/2020    3:49 PM  Readmission Risk Prevention Plan  Transportation Screening Complete Complete  PCP or Specialist Appt within 5-7 Days  Not Complete  Not Complete comments  pending disposition  Home Care Screening  Complete  Medication Review (RN CM)  Referral to Pharmacy  Medication  Review Press photographer) Complete   PCP or Specialist appointment within 3-5 days of discharge Complete   HRI or Home Care Consult Complete   SW Recovery Care/Counseling Consult Complete   [place] Complete

## 2022-09-26 NOTE — Progress Notes (Signed)
D/C order noted. Contacted [place] [facility] and spoke to one of the RN. Staff aware pt will d/c to snf today and resume care tomorrow.   Melven Sartorius Renal Navigator [phone]

## 2022-09-26 NOTE — Discharge Summary (Addendum)
Physician Discharge Summary   Clemence Stillings WIO:[id number] DOB: [birth year] DOA: 09/19/2022  PCP: Jodi Marble, MD  Admit date: 09/19/2022 Discharge date: 09/26/2022 Barriers to discharge: none  Admitted From: SNF Disposition:  [facility] Discharging physician: Dwyane Dee, MD  Recommendations for Outpatient Follow-up:  Continue HD Clonidine was discontinued and coreg dose lowered; monitor BP for stability   Discharge Condition: stable CODE STATUS: Full Diet recommendation:  Diet Orders (From admission, onward)     Start     Ordered   09/25/22 1046  Diet renal with fluid restriction Fluid restriction: 1200 mL Fluid; Room service appropriate? Yes; Fluid consistency: Thin  Diet effective now       Question Answer Comment  Fluid restriction: 1200 mL Fluid   Room service appropriate? Yes   Fluid consistency: Thin      09/25/22 [place] Hospital Course: Nicandro Perrault is a 67 y.o. 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 Patient presented secondary to abdominal pain and found to have a lipase of 507 with negative CT findings of pancreatitis. Patient made NPO and started on analgesics for pain management. Diet slowly advanced.  Assessment and Plan:  Acute pancreatitis Patient had abdominal pain with elevated lipase at 507.  CT abdomen was negative for pancreatic inflammation.  Has been recurrent issue for the patient.  -Suspected etiology on admission was possibly due to lamivudine component in Dovato and he has been transitioned to [place] renal diet prior to discharge   Essential hypertension -Blood pressure has been soft.  He has been on Coreg, hydralazine -going to lower coreg dose some to allow more BP room with HD -Agree with not resuming clonidine at discharge  High anion gap  acidosis Improved.  Continue hemodialysis.   ESRD -Continue on dialysis as per nephrology   History CVA chronic right hemiparesis. Supportive care.   HIV disease History of AIDS. Most recent CD4 count of 357 from 08/2022. Patient is on Dovato as an outpatient which was switched back to [place] secondary to concern that Dovato could possibly be contributing to pancreatitis.  Continue Biktarvy.   Seizure disorder -Continue Keppra 500 mg BID   Diabetes mellitus, type 2 Controlled.  Latest Hemoglobin A1C of 6.5%.   GERD Continue PPI.   Hyperlipidemia -Continue Crestor   Principal Diagnosis: Acute pancreatitis  Discharge Diagnoses: Active Hospital Problems   Diagnosis Date Noted   Acute pancreatitis 09/20/2022    Priority: 1.   High anion gap metabolic acidosis 42/68/3419    Priority: 2.   ESRD (end stage renal disease) on dialysis ([place]) 12/20/2020    Priority: 3.   HTN (hypertension) 10/19/2021    Priority: 4.   History of cerebrovascular accident (CVA) with residual deficit 03/19/2020    Priority: 5.   Seizure disorder ([place]) 04/10/2020    Priority: 7.   Type 2 diabetes mellitus with hyperlipidemia ([place])     Priority: 8.   GERD (gastroesophageal reflux disease)     Priority: 9.   Hyperlipidemia 08/25/2012    Priority: 10.   Human immunodeficiency virus (HIV) disease ([place]) 12/20/2020    Resolved Hospital Problems  No resolved problems to display.     Discharge Instructions     Increase activity slowly   Complete by: As directed       Allergies as of  09/26/2022       Reactions   Oxycodone Nausea And Vomiting   NOT DOCUMENTED ON MAR        Medication List     STOP taking these medications    cloNIDine 0.1 MG tablet Commonly known as: CATAPRES   Dovato 50-300 MG tablet Generic drug: dolutegravir-lamiVUDine       TAKE these medications    Accu-Chek Aviva Plus test strip Generic drug: glucose blood USE 1 strip TO test blood sugar 2 TIMES  DAILY TO 3 TIMES DAILY   acetaminophen 325 MG tablet Commonly known as: TYLENOL Take 650 mg by mouth every 6 (six) hours as needed for moderate pain, fever or mild pain.   b complex-vitamin c-folic acid 0.8 MG Tabs tablet Take 1 tablet by mouth daily.   bictegravir-emtricitabine-tenofovir AF 50-200-25 MG Tabs tablet Commonly known as: BIKTARVY Take 1 tablet by mouth daily. Start taking on: September 27, 2022   blood glucose meter kit and supplies Kit Dispense based on patient and insurance preference. Check blood sugar 4 times a day   calcium acetate 667 MG capsule Commonly known as: PHOSLO Take 2,001 mg by mouth 3 (three) times daily with meals.   carvedilol 6.25 MG tablet Commonly known as: COREG Take 1 tablet (6.25 mg total) by mouth 2 (two) times daily with a meal. What changed:  medication strength how much to take when to take this   clopidogrel 75 MG tablet Commonly known as: PLAVIX Take 1 tablet (75 mg total) by mouth daily.   Commode 3-In-1 Misc Use as directed   Transfer Bench Misc Use as directed   docusate sodium 100 MG capsule Commonly known as: COLACE Take 100 mg by mouth daily.   doxepin 75 MG capsule Commonly known as: SINEQUAN Take 75 mg by mouth at bedtime.   Ergocalciferol 50 MCG (2000 UT) Tabs Take 2,000 Units by mouth daily.   feeding supplement (NEPRO CARB STEADY) Liqd Take 237 mLs by mouth 3 (three) times daily between meals.   hydrALAZINE 10 MG tablet Commonly known as: APRESOLINE Take 10 mg by mouth 3 (three) times daily.   hydrocortisone cream 1 % Apply 1 Application topically every 6 (six) hours as needed for itching. Apply to affected areas on back arms   insulin glargine-yfgn 100 UNIT/ML Pen Commonly known as: SEMGLEE Inject 5 Units into the skin at bedtime.   Insulin Pen Needle 31G X 8 MM Misc Commonly known as: Sure Comfort Pen Needles Use as directed twice daily with Novolog flex pen   levETIRAcetam 500 MG  tablet Commonly known as: KEPPRA Take 500 mg by mouth 2 (two) times daily.   Lidocaine 3 % Crea Apply 1 application topically See admin instructions. Apply 1 times a day on Tuesday, Thursday and Saturday prior to dialysis   ondansetron 4 MG tablet Commonly known as: ZOFRAN Take 4 mg by mouth every 8 (eight) hours as needed for vomiting or nausea.   oxyCODONE 5 MG immediate release tablet Commonly known as: Oxy IR/ROXICODONE Take 1 tablet (5 mg total) by mouth every 4 (four) hours as needed for severe pain or breakthrough pain.   pantoprazole 40 MG tablet Commonly known as: PROTONIX TAKE 1 TABLET BY MOUTH EVERY DAY   Retacrit 20000 UNIT/ML injection Generic drug: epoetin alfa-epbx Inject 20,000 Units into the skin every Monday.   rosuvastatin 5 MG tablet Commonly known as: CRESTOR Take 5 mg by mouth daily.   tamsulosin 0.4 MG Caps capsule Commonly known as:  FLOMAX TAKE 1 CAPSULE BY MOUTH EVERY DAY   triamcinolone cream 0.1 % Commonly known as: KENALOG Apply 1 Application topically in the morning and at bedtime. Apply to back and arms for rash twice a day   TRUEplus Insulin Syringe 31G X 5/16" 1 ML Misc Generic drug: Insulin Syringe-Needle U-100 Use pen needle with insulin 2 times daily        Allergies  Allergen Reactions   Oxycodone Nausea And Vomiting    NOT DOCUMENTED ON MAR    Consultations: Nephrology  Procedures:   Discharge Exam: BP 111/64   Pulse 80   Temp 98 F (36.7 C) (Oral)   Resp 19   Wt 67.7 kg   SpO2 100%   BMI 20.24 kg/m  Physical Exam Constitutional:      General: He is not in acute distress.    Appearance: He is well-developed.  HENT:     Head: Normocephalic and atraumatic.     Mouth/Throat:     Mouth: Mucous membranes are moist.  Cardiovascular:     Rate and Rhythm: Normal rate and regular rhythm.  Pulmonary:     Effort: Pulmonary effort is normal.     Breath sounds: Normal breath sounds.  Abdominal:     General: Bowel  sounds are normal. There is no distension.     Palpations: Abdomen is soft.     Tenderness: There is no abdominal tenderness.  Musculoskeletal:        General: Normal range of motion.     Cervical back: Normal range of motion and neck supple.  Skin:    General: Skin is warm.  Neurological:     Mental Status: He is alert. Mental status is at baseline.      The results of significant diagnostics from this hospitalization (including imaging, microbiology, ancillary and laboratory) are listed below for reference.   Microbiology: No results found for this or any previous visit (from the past 240 hour(s)).   Labs: BNP (last 3 results) Recent Labs    08/13/22 1020 08/31/22 2342  BNP 1,861.9* 6,195.0*   Basic Metabolic Panel: Recent Labs  Lab 09/19/22 2140 09/20/22 1750 09/21/22 1433 09/23/22 1026 09/25/22 0146  NA 137  --  137 132* 131*  K 5.5*  --  4.9 4.2 3.6  CL 96*  --  95* 93* 91*  CO2 17*  --  22 26 26   GLUCOSE 138*  --  108* 84 137*  BUN 73*  --  79* 43* 23  CREATININE 15.11*  --  16.58* 10.92* 8.07*  CALCIUM 8.8*  --  8.2* 8.5* 8.5*  MG  --   --   --   --  3.3*  PHOS  --  10.2* 10.6* 8.0* 5.8*   Liver Function Tests: Recent Labs  Lab 09/19/22 2140 09/23/22 1026 09/25/22 0146  AST 32  --   --   ALT 16  --   --   ALKPHOS 103  --   --   BILITOT 0.7  --   --   PROT 7.5  --   --   ALBUMIN 3.3* 2.8* 3.0*   Recent Labs  Lab 09/19/22 2140 09/25/22 0146  LIPASE 507* 28   No results for input(s): "AMMONIA" in the last 168 hours. CBC: Recent Labs  Lab 09/19/22 2244 09/23/22 1026 09/25/22 0146  WBC 8.8 6.5 7.6  NEUTROABS 5.9  --   --   HGB 13.2 11.4* 11.7*  HCT 39.6 36.5* 36.8*  MCV  94.1 98.4 97.1  PLT 255 232 192   Cardiac Enzymes: No results for input(s): "CKTOTAL", "CKMB", "CKMBINDEX", "TROPONINI" in the last 168 hours. BNP: Invalid input(s): "POCBNP" CBG: Recent Labs  Lab 09/25/22 1210 09/25/22 1626 09/25/22 2122 09/26/22 0801  09/26/22 1131  GLUCAP 104* 137* 134* 126* 190*   D-Dimer No results for input(s): "DDIMER" in the last 72 hours. Hgb A1c No results for input(s): "HGBA1C" in the last 72 hours. Lipid Profile No results for input(s): "CHOL", "HDL", "LDLCALC", "TRIG", "CHOLHDL", "LDLDIRECT" in the last 72 hours. Thyroid function studies No results for input(s): "TSH", "T4TOTAL", "T3FREE", "THYROIDAB" in the last 72 hours.  Invalid input(s): "FREET3" Anemia work up No results for input(s): "VITAMINB12", "FOLATE", "FERRITIN", "TIBC", "IRON", "RETICCTPCT" in the last 72 hours. Urinalysis    Component Value Date/Time   COLORURINE YELLOW 09/21/2022 0429   APPEARANCEUR CLEAR 09/21/2022 0429   LABSPEC 1.014 09/21/2022 0429   PHURINE 5.0 09/21/2022 0429   GLUCOSEU 50 (A) 09/21/2022 0429   HGBUR SMALL (A) 09/21/2022 0429   BILIRUBINUR NEGATIVE 09/21/2022 0429   KETONESUR NEGATIVE 09/21/2022 0429   PROTEINUR 100 (A) 09/21/2022 0429   UROBILINOGEN 0.2 11/29/2014 2003   NITRITE NEGATIVE 09/21/2022 0429   LEUKOCYTESUR NEGATIVE 09/21/2022 0429   Sepsis Labs Recent Labs  Lab 09/19/22 2244 09/23/22 1026 09/25/22 0146  WBC 8.8 6.5 7.6   Microbiology No results found for this or any previous visit (from the past 240 hour(s)).  Procedures/Studies: CT ABDOMEN PELVIS WO CONTRAST  Result Date: 09/19/2022 CLINICAL DATA:  Abdominal pain. Nausea and vomiting. Patient scanned with right side down due to contracted posture. EXAM: CT ABDOMEN AND PELVIS WITHOUT CONTRAST TECHNIQUE: Multidetector CT imaging of the abdomen and pelvis was performed following the standard protocol without IV contrast. RADIATION DOSE REDUCTION: This exam was performed according to the departmental dose-optimization program which includes automated exposure control, adjustment of the mA and/or kV according to patient size and/or use of iterative reconstruction technique. COMPARISON:  CT examination dated August 09, 2020 FINDINGS: Lower  chest: No acute abnormality. Hepatobiliary: No focal liver abnormality is seen. No gallstones, gallbladder wall thickening, or biliary dilatation. Pancreas: Unremarkable. No pancreatic ductal dilatation or surrounding inflammatory changes. Spleen: Normal in size without focal abnormality. Adrenals/Urinary Tract: Adrenal glands are unremarkable. Simple cyst in the interpolar region of the right kidney measuring up to 2 cm. Nonobstructing 2-3 mm calculus in the lower pole of the left kidney. Evidence of hydronephrosis. No appreciable ureteral calculus. Bladder is unremarkable. Stomach/Bowel: Stomach is within normal limits. Appendix appears normal. No evidence of bowel wall thickening, distention, or inflammatory changes. Vascular/Lymphatic: Aortic atherosclerosis. No enlarged abdominal or pelvic lymph nodes. Reproductive: Prostate is unremarkable. Other: No abdominal wall hernia or abnormality. No abdominopelvic ascites. Musculoskeletal: No acute or significant osseous findings. IMPRESSION: 1. Nonobstructing 2-3 mm calculus in the lower pole of the left kidney. No evidence of hydronephrosis or ureteral calculus. 2. Simple cyst in the interpolar region of the right kidney measuring up to 2 cm. 3. Bowel loops are normal in caliber. Normal appendix. No evidence of colitis or diverticulitis. 4. Aortic atherosclerosis. Aortic Atherosclerosis (ICD10-I70.0). Electronically Signed   By: Keane Police D.O.   On: 09/19/2022 23:35   ECHOCARDIOGRAM COMPLETE  Result Date: 09/01/2022    ECHOCARDIOGRAM REPORT   Patient Name:   SHANNAN GARFINKEL Date of Exam: 09/01/2022 Medical Rec #:  [id number]             Height:  72.0 in Accession #:    [id number]            Weight:       169.5 lb Date of Birth:  1956-08-08             BSA:          1.986 m Patient Age:    67 years              BP:           142/118 mmHg Patient Gender: M                     HR:           96 bpm. Exam Location:  Inpatient Procedure: 2D Echo, Cardiac  Doppler and Color Doppler Indications:    Elevated Troponin  History:        Patient has prior history of Echocardiogram examinations, most                 recent 10/20/2021. Stroke; Risk Factors:Hypertension, Diabetes                 and GERD.  Sonographer:    Bernadene Person RDCS Referring Phys: [id number] [place]  1. Left ventricular ejection fraction, by estimation, is 45 to 50%. The left ventricle has mildly decreased function. The left ventricle demonstrates regional wall motion abnormalities (see scoring diagram/findings for description). There is moderate left ventricular hypertrophy. Left ventricular diastolic parameters are consistent with Grade I diastolic dysfunction (impaired relaxation). Elevated left ventricular end-diastolic pressure. There is hypokinesis of the left ventricular, entire inferior wall.  2. Right ventricular systolic function is normal. The right ventricular size is normal. Tricuspid regurgitation signal is inadequate for assessing PA pressure.  3. Left atrial size was mild to moderately dilated.  4. The mitral valve is normal in structure. No evidence of mitral valve regurgitation. No evidence of mitral stenosis.  5. The aortic valve is tricuspid. There is mild calcification of the aortic valve. Aortic valve regurgitation is not visualized. Aortic valve sclerosis is present, with no evidence of aortic valve stenosis.  6. The inferior vena cava is normal in size with greater than 50% respiratory variability, suggesting right atrial pressure of 3 mmHg. Comparison(s): No significant change from prior study. FINDINGS  Left Ventricle: Left ventricular ejection fraction, by estimation, is 45 to 50%. The left ventricle has mildly decreased function. The left ventricle demonstrates regional wall motion abnormalities. The left ventricular internal cavity size was normal in size. There is moderate left ventricular hypertrophy. Left ventricular diastolic parameters are consistent  with Grade I diastolic dysfunction (impaired relaxation). Elevated left ventricular end-diastolic pressure. Right Ventricle: The right ventricular size is normal. No increase in right ventricular wall thickness. Right ventricular systolic function is normal. Tricuspid regurgitation signal is inadequate for assessing PA pressure. Left Atrium: Left atrial size was mild to moderately dilated. Right Atrium: Right atrial size was normal in size. Pericardium: There is no evidence of pericardial effusion. Mitral Valve: The mitral valve is normal in structure. No evidence of mitral valve regurgitation. No evidence of mitral valve stenosis. Tricuspid Valve: The tricuspid valve is normal in structure. Tricuspid valve regurgitation is not demonstrated. No evidence of tricuspid stenosis. Aortic Valve: The aortic valve is tricuspid. There is mild calcification of the aortic valve. Aortic valve regurgitation is not visualized. Aortic valve sclerosis is present, with no evidence of aortic valve stenosis. Pulmonic Valve: The pulmonic valve was not  well visualized. Pulmonic valve regurgitation is not visualized. No evidence of pulmonic stenosis. Aorta: The aortic root, ascending aorta and aortic arch are all structurally normal, with no evidence of dilitation or obstruction. Venous: The inferior vena cava is normal in size with greater than 50% respiratory variability, suggesting right atrial pressure of 3 mmHg. IAS/Shunts: The atrial septum is grossly normal.  LEFT VENTRICLE PLAX 2D LVIDd:         4.90 cm      Diastology LVIDs:         3.70 cm      LV e' medial:    3.00 cm/s LV PW:         1.92 cm      LV E/e' medial:  24.2 LV IVS:        1.83 cm      LV e' lateral:   3.43 cm/s LVOT diam:     2.20 cm      LV E/e' lateral: 21.2 LV SV:         69 LV SV Index:   35 LVOT Area:     3.80 cm  LV Volumes (MOD) LV vol d, MOD A2C: 121.0 ml LV vol d, MOD A4C: 160.0 ml LV vol s, MOD A2C: 63.6 ml LV vol s, MOD A4C: 94.3 ml LV SV MOD A2C:      57.4 ml LV SV MOD A4C:     160.0 ml LV SV MOD BP:      62.4 ml RIGHT VENTRICLE RV S prime:     13.90 cm/s TAPSE (M-mode): 2.5 cm LEFT ATRIUM             Index        RIGHT ATRIUM           Index LA diam:        4.30 cm 2.17 cm/m   RA Area:     15.30 cm LA Vol (A2C):   75.3 ml 37.91 ml/m  RA Volume:   34.90 ml  17.57 ml/m LA Vol (A4C):   56.2 ml 28.30 ml/m LA Biplane Vol: 66.8 ml 33.63 ml/m  AORTIC VALVE LVOT Vmax:   110.00 cm/s LVOT Vmean:  70.200 cm/s LVOT VTI:    0.182 m  AORTA Ao Root diam: 3.40 cm Ao Asc diam:  3.30 cm MITRAL VALVE MV Area (PHT): 6.17 cm     SHUNTS MV Decel Time: 123 msec     Systemic VTI:  0.18 m MV E velocity: 72.70 cm/s   Systemic Diam: 2.20 cm MV A velocity: 137.00 cm/s MV E/A ratio:  0.53 Buford Dresser MD Electronically signed by Buford Dresser MD Signature Date/Time: 09/01/2022/5:09:38 PM    Final    DG Chest Port 1 View  Result Date: 08/31/2022 CLINICAL DATA:  Shortness of breath. EXAM: PORTABLE CHEST 1 VIEW COMPARISON:  Radiograph 08/14/2019 FINDINGS: Right-sided dialysis catheter in place. Stable cardiomegaly. Vascular congestion with possible central perihilar edema. No large pleural effusion on this AP view. No confluent airspace disease. No pneumothorax. Skin fold projects over the left hemithorax. IMPRESSION: Cardiomegaly with vascular congestion and possible central perihilar edema. Electronically Signed   By: Keith Rake M.D.   On: 08/31/2022 23:35     Time coordinating discharge: Over 30 minutes    Dwyane Dee, MD  [facility] 09/26/2022, 1:21 PM

## 2022-09-26 NOTE — TOC Transition Note (Signed)
Transition of Care [facility]) - CM/SW Discharge Note   Patient Details  Name: Richard [name] MRN: [id number] Date of Birth: Aug 11, 1956  [facility] CM/SW Contact:  Joanne Chars, LCSW Phone Number: 09/26/2022, 2:05 PM   Clinical Narrative:   Pt discharging to [facility].  RN call report to [phone].      Final next level of care: Long Term Nursing Home Barriers to Discharge: Barriers Resolved   Patient Goals and CMS Choice   Choice offered to / list presented to : Patient  Discharge Placement                Patient chooses bed at:  [facility]) Patient to be transferred to facility by: [place] Name of family member notified: no contact listed, pt did not identify anyone Patient and family notified of of transfer: 09/26/22  Discharge Plan and Services Additional resources added to the After Visit Summary for   In-house Referral: Clinical Social Work   Post Acute Care Choice: [place]                               Social Determinants of Health ([place]) Interventions [place]: No Food Insecurity (09/20/2022)  Housing: Low Risk  (09/20/2022)  Transportation Needs: No Transportation Needs (09/20/2022)  Utilities: Not At Risk (09/20/2022)  Depression (PHQ2-9): Low Risk  (03/19/2022)  Financial Resource Strain: Low Risk  (07/17/2021)  Physical Activity: Inactive (07/17/2021)  Social Connections: Socially Isolated (07/17/2021)  Stress: Stress Concern Present (07/17/2021)  Tobacco Use: High Risk (09/19/2022)     Readmission Risk Interventions    10/22/2021   11:56 AM 02/15/2020    3:49 PM  Readmission Risk Prevention Plan  Transportation Screening Complete Complete  PCP or Specialist Appt within 5-7 Days  Not Complete  Not Complete comments  pending disposition  Home Care Screening  Complete  Medication Review (RN CM)  Referral to Pharmacy  Medication Review (RN Care Manager) Complete    PCP or Specialist appointment within 3-5 days of discharge Complete   HRI or Home Care Consult Complete   SW Recovery Care/Counseling Consult Complete   Palliative Care Screening Not [place] Complete

## 2022-09-26 NOTE — NC FL2 (Signed)
[place] LEVEL OF CARE FORM     IDENTIFICATION  Patient Name: Richard [name] Birthdate: 1956-05-30 Sex: male Admission Date (Current Location): 09/19/2022  [place] and [place] Number:  Kathleen Argue [id number] [place] and Address:  [facility], [place] [address] [place], [place] [address]      Provider Number: [id number]  Attending Physician Name and Address:  Dwyane Dee, MD  Relative Name and Phone Number:       Current Level of Care: Hospital Recommended Level of Care: [place] Prior Approval Number:    Date Approved/Denied:   PASRR Number: [id number]  Discharge Plan: SNF    Current Diagnoses: Patient Active Problem List   Diagnosis Date Noted   Acute pancreatitis 09/20/2022   High anion gap metabolic acidosis 36/14/4315   Renal failure 09/03/2022   Acute respiratory failure with hypoxia ([place]) 09/01/2022   Lack of housing 03/31/2022   Hematoma 03/19/2022   Other disorders of calcium metabolism 03/05/2022   Fall from non-moving wheelchair 10/19/2021   HTN (hypertension) 10/19/2021   Hypermagnesemia 10/19/2021   Allergic rhinitis 10/19/2021   Buttock pain 08/30/2021   Volume overload 04/14/2021   Elevated troponin 02/18/2021   Acute on chronic diastolic CHF (congestive heart failure) ([place]) 02/18/2021   Depression 02/18/2021   Hemorrhoids 01/11/2021   Hyperkalemia 01/10/2021   Dialysis patient, noncompliant    Dependence on supplemental oxygen 12/20/2020   ESRD (end stage renal disease) on dialysis ([place]) 12/20/2020   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 ([place]) 12/20/2020   Human immunodeficiency virus (HIV) disease ([place]) 12/20/2020   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   Syncope 06/01/2020   Secondary hyperparathyroidism of renal origin ([place]) 05/28/2020   Hypokalemia 05/16/2020   Allergy, unspecified, initial encounter 05/08/2020   Anaphylactic shock,  unspecified, initial encounter 05/08/2020   Anemia in chronic kidney disease 40/04/6760   Complication of vascular dialysis catheter 05/08/2020   Dependence on renal dialysis ([place]) 05/08/2020   Headache, unspecified 05/08/2020   Iron deficiency anemia, unspecified 05/08/2020   Cerebellar stroke syndrome 05/07/2020   Encounter for feeding tube placement    Weakness    Creatinine elevation    Goals of care, counseling/discussion    Hypertensive urgency    Palliative care by specialist    AIDS (acquired immune deficiency syndrome) ([place]) 04/13/2020   Seizure disorder ([place]) 04/10/2020   History of cerebrovascular accident (CVA) with residual deficit 03/19/2020   Neuropathy in diabetes ([place])    Migraines    GERD (gastroesophageal reflux disease)    Type 2 diabetes mellitus with hyperlipidemia ([place])    Arthritis    Hyperlipidemia 08/25/2012   Chronic kidney disease, stage 4 (severe) ([place]) 08/23/2012   Tobacco abuse 08/23/2012   Malnutrition of moderate degree ([place]) 08/23/2012   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 05/21/2012   Hypertensive emergency 05/21/2012    Orientation RESPIRATION BLADDER Height & Weight     Self, Time, Situation, Place  Normal Continent Weight: 149 lb 3.2 oz (67.7 kg) Height:     BEHAVIORAL SYMPTOMS/MOOD NEUROLOGICAL BOWEL NUTRITION STATUS    Convulsions/Seizures Incontinent Diet (see discharge summary)  AMBULATORY STATUS COMMUNICATION OF NEEDS Skin    (no eval done) Verbally Other (Comment) (dry)                       Personal Care Assistance Level of Assistance   (no eval done)  Functional Limitations Info  Sight, Hearing, Speech Sight Info: Adequate Hearing Info: Adequate Speech Info: Impaired    SPECIAL CARE FACTORS FREQUENCY                       Contractures Contractures Info: Not present    Additional Factors Info  Code Status, Allergies, Insulin Sliding Scale Code Status Info:  full Allergies Info: oxycodone   Insulin Sliding Scale Info: Novolog: see discharge summary       Current Medications (09/26/2022):  This is the current hospital active medication list Current Facility-Administered Medications  Medication Dose Route Frequency Provider Last Rate Last Admin   acetaminophen (TYLENOL) tablet 650 mg  650 mg Oral Q6H PRN Etta Quill, DO   650 mg at 09/24/22 1620   Or   acetaminophen (TYLENOL) suppository 650 mg  650 mg Rectal Q6H PRN Etta Quill, DO       bictegravir-emtricitabine-tenofovir AF (BIKTARVY) 50-200-25 MG per tablet 1 tablet  1 tablet Oral Daily Jennette Kettle M, DO   1 tablet at 09/26/22 0857   calcium acetate (PHOSLO) capsule 2,001 mg  2,001 mg Oral TID WC Claudia Desanctis, MD   2,001 mg at 09/26/22 0856   carvedilol (COREG) tablet 6.25 mg  6.25 mg Oral BID WC Dwyane Dee, MD   6.25 mg at 09/26/22 0857   Chlorhexidine Gluconate Cloth 2 % PADS 6 each  6 each Topical Q0600 Roney Jaffe, MD   6 each at 09/25/22 1235   clopidogrel (PLAVIX) tablet 75 mg  75 mg Oral Daily Jennette Kettle M, DO   75 mg at 09/26/22 0857   diphenhydrAMINE (BENADRYL) injection 25-50 mg  25-50 mg Intravenous Once in dialysis Roney Jaffe, MD       doxepin (SINEQUAN) capsule 75 mg  75 mg Oral QHS Jennette Kettle M, DO   75 mg at 09/25/22 2252   doxercalciferol (HECTOROL) injection 3 mcg  3 mcg Intravenous Q T,Th,Sa-HD Roney Jaffe, MD   3 mcg at 09/25/22 0527   heparin injection 5,000 Units  5,000 Units Subcutaneous Q8H Jennette Kettle M, DO   5,000 Units at 09/25/22 2251   hydrALAZINE (APRESOLINE) tablet 10 mg  10 mg Oral Q8H Jennette Kettle M, DO   10 mg at 09/25/22 2252   insulin aspart (novoLOG) injection 0-6 Units  0-6 Units Subcutaneous Q4H Etta Quill, DO   2 Units at 09/24/22 1800   levETIRAcetam (KEPPRA) tablet 500 mg  500 mg Oral BID Etta Quill, DO   500 mg at 09/26/22 0858   ondansetron (ZOFRAN) tablet 4 mg  4 mg Oral Q6H PRN Etta Quill,  DO       Or   ondansetron [facility]) injection 4 mg  4 mg Intravenous Q6H PRN Etta Quill, DO       pantoprazole (PROTONIX) EC tablet 40 mg  40 mg Oral Daily Jennette Kettle M, DO   40 mg at 09/26/22 0857   rosuvastatin (CRESTOR) tablet 5 mg  5 mg Oral Daily Jennette Kettle M, DO   5 mg at 09/26/22 0856   tamsulosin (FLOMAX) capsule 0.4 mg  0.4 mg Oral Daily Jennette Kettle M, DO   0.4 mg at 09/26/22 0857   traMADol (ULTRAM) tablet 50 mg  50 mg Oral Q12H PRN Mariel Aloe, MD   50 mg at 09/26/22 2637     Discharge Medications: Please see discharge summary for a list of discharge medications.  Relevant  Imaging Results:  Relevant Lab Results:   Additional Information [phone]  Joanne Chars, LCSW

## 2022-09-26 NOTE — Progress Notes (Signed)
Pt refused am labs as well as morning meds. Pt was encouraged and educated-needs reinforcement

## 2022-09-26 NOTE — Progress Notes (Addendum)
[facility] Progress Note   Subjective:    He refused labs as well as morning meds.  Last HD on 1/4 early AM with 1.5 kg UF.  He states that he ate a few bites yesterday.  Still uncomfortable.  Team is hoping for discharge today if possible    Review of systems:     Limited secondary to patient factors - poor historian  Denies shortness of breath  Hx nausea - eating some  Objective Vitals:   09/25/22 1509 09/25/22 1955 09/26/22 0425 09/26/22 0802  BP: 107/69 (!) 125/58 103/63 121/66  Pulse: 94 94 88 80  Resp:  14 15 19   Temp: 97.8 F (36.6 C) 97.8 F (36.6 C) 98.6 F (37 C) 98 F (36.7 C)  TempSrc: Oral Oral Oral Oral  SpO2: 100% 100% 100% 100%  Weight:       Physical Exam   General adult male in bed in no acute distress HEENT normocephalic atraumatic extraocular movements intact sclera anicteric Neck supple trachea midline Lungs clear to auscultation bilaterally normal work of breathing at rest on room air Heart S1S2 no rub Abdomen soft nontender on my exam; nondistended Extremities no edema  Psych no anxiety or agitation  Neuro - dysarthria follows commands and initiates speech more today.  Access RIJ tunn catheter in place; LUE AVF bruit and thrill     Filed Weights   09/23/22 1205 09/25/22 0133 09/25/22 0600  Weight: 72.5 kg 69.5 kg 67.7 kg    Intake/Output Summary (Last 24 hours) at 09/26/2022 0814 Last data filed at 09/25/2022 1616 Gross per 24 hour  Intake 200 ml  Output --  Net 200 ml    Additional Objective Labs: Basic Metabolic Panel: Recent Labs  Lab 09/21/22 1433 09/23/22 1026 09/25/22 0146  NA 137 132* 131*  K 4.9 4.2 3.6  CL 95* 93* 91*  CO2 22 26 26   GLUCOSE 108* 84 137*  BUN 79* 43* 23  CREATININE 16.58* 10.92* 8.07*  CALCIUM 8.2* 8.5* 8.5*  PHOS 10.6* 8.0* 5.8*   Liver Function Tests: Recent Labs  Lab 09/19/22 2140 09/23/22 1026 09/25/22 0146  AST 32  --   --   ALT 16  --   --   ALKPHOS 103  --   --    BILITOT 0.7  --   --   PROT 7.5  --   --   ALBUMIN 3.3* 2.8* 3.0*   Recent Labs  Lab 09/19/22 2140 09/25/22 0146  LIPASE 507* 28   CBC: Recent Labs  Lab 09/19/22 2244 09/23/22 1026 09/25/22 0146  WBC 8.8 6.5 7.6  NEUTROABS 5.9  --   --   HGB 13.2 11.4* 11.7*  HCT 39.6 36.5* 36.8*  MCV 94.1 98.4 97.1  PLT 255 232 192   Blood Culture    Component Value Date/Time   SDES BLOOD RIGHT ANTECUBITAL 09/01/2022 0122   SPECREQUEST  09/01/2022 0122    BOTTLES DRAWN AEROBIC AND ANAEROBIC Blood Culture adequate volume   CULT  09/01/2022 0122    NO GROWTH 5 DAYS Performed at [facility], [place] [address] [place], [place] [address]    REPTSTATUS 09/06/2022 FINAL 09/01/2022 0122    Cardiac Enzymes: No results for input(s): "CKTOTAL", "CKMB", "CKMBINDEX", "TROPONINI" in the last 168 hours. CBG: Recent Labs  Lab 09/25/22 0652 09/25/22 1210 09/25/22 1626 09/25/22 2122 09/26/22 0801  GLUCAP 108* 104* 137* 134* 126*   Iron Studies: No results for input(s): "IRON", "TIBC", "TRANSFERRIN", "FERRITIN"  in the last 72 hours. Lab Results  Component Value Date   INR 1.1 07/23/2022   INR 1.1 12/19/2021   INR 1.1 04/13/2020   Studies/Results: No results found.  Medications:    bictegravir-emtricitabine-tenofovir AF  1 tablet Oral Daily   calcium acetate  2,001 mg Oral TID WC   carvedilol  6.25 mg Oral BID WC   Chlorhexidine Gluconate Cloth  6 each Topical Q0600   clopidogrel  75 mg Oral Daily   diphenhydrAMINE  25-50 mg Intravenous Once in dialysis   doxepin  75 mg Oral QHS   doxercalciferol  3 mcg Intravenous Q T,Th,Sa-HD   heparin  5,000 Units Subcutaneous Q8H   hydrALAZINE  10 mg Oral Q8H   insulin aspart  0-6 Units Subcutaneous Q4H   levETIRAcetam  500 mg Oral BID   pantoprazole  40 mg Oral Daily   rosuvastatin  5 mg Oral Daily   tamsulosin  0.4 mg Oral Daily    Dialysis Orders: [facility]  4h  400/800  70kg  2/2 bath TDC  Heparin 1600  - last HD 12/28,  post wt 73.3kg, signing off early last 3 hd - hectorol 3 ug tiw  - venofer 50 weekly - mircera 200 q2, last 12/07, due 12/21  - last Hb 10.3 on 11/28     Na 137  K 5.5  CO2 17  AG 24  BUN 73  creat 15  Ca 8.8, alb 3.3    AST / ALT / tbili wnl   Hb 13.2 WBC 8K      CT abd - no acute findings, +renal cyst and non-obstructing renal stone x 1, normal pancreas  Assessment/Plan: Abdominal pain - w/ N/V/D.  CT abd negative. Suspected to be acute pancreatitis.  Per primary team  ESRD - on HD per TTS schedule. (To clarify - last treatment was 1/4 early AM - he is on schedule.)  Pt poor compliance w/ OP HD ,signs off early and is noncompliant, frequently misses HD completely on Saturdays.   HTN/ volume - concern for medication noncompliance at home. No vol excess on exam.  Parameters were placed on clonidine due to hypotension.  On med hx review he has usually been getting once or at most twice a day and is ordered TID.  We have stopped clonidine as he is likely noncompliant so not the ideal medication for him.  Controlled on current regimen.  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 hyperphosphatemia.  Resumed phoslo  Hyperkalemia - resolved    Disposition - per primary team.   Claudia Desanctis, MD [facility] 09/26/2022,8:14 AM  LOS: 6 days

## 2022-09-27 ENCOUNTER — Other Ambulatory Visit: Payer: Self-pay

## 2022-09-27 ENCOUNTER — Emergency Department (HOSPITAL_COMMUNITY): Payer: No Typology Code available for payment source

## 2022-09-27 ENCOUNTER — Emergency Department (HOSPITAL_COMMUNITY)
Admission: EM | Admit: 2022-09-27 | Discharge: 2022-09-27 | Disposition: A | Discharge status: Skilled Nursing Facility | Payer: No Typology Code available for payment source | Attending: Emergency Medicine | Admitting: Emergency Medicine

## 2022-09-27 DIAGNOSIS — I10 Essential (primary) hypertension: Secondary | ICD-10-CM | POA: Diagnosis not present

## 2022-09-27 DIAGNOSIS — Z992 Dependence on renal dialysis: Secondary | ICD-10-CM | POA: Insufficient documentation

## 2022-09-27 DIAGNOSIS — Z794 Long term (current) use of insulin: Secondary | ICD-10-CM | POA: Insufficient documentation

## 2022-09-27 DIAGNOSIS — W19XXXA Unspecified fall, initial encounter: Secondary | ICD-10-CM

## 2022-09-27 DIAGNOSIS — Z7401 Bed confinement status: Secondary | ICD-10-CM | POA: Diagnosis not present

## 2022-09-27 DIAGNOSIS — E1122 Type 2 diabetes mellitus with diabetic chronic kidney disease: Secondary | ICD-10-CM | POA: Diagnosis not present

## 2022-09-27 DIAGNOSIS — S0181XA Laceration without foreign body of other part of head, initial encounter: Secondary | ICD-10-CM | POA: Diagnosis not present

## 2022-09-27 DIAGNOSIS — N186 End stage renal disease: Secondary | ICD-10-CM | POA: Diagnosis not present

## 2022-09-27 DIAGNOSIS — I69959 Hemiplegia and hemiparesis following unspecified cerebrovascular disease affecting unspecified side: Secondary | ICD-10-CM | POA: Diagnosis not present

## 2022-09-27 DIAGNOSIS — S0990XA Unspecified injury of head, initial encounter: Secondary | ICD-10-CM | POA: Insufficient documentation

## 2022-09-27 DIAGNOSIS — R9389 Abnormal findings on diagnostic imaging of other specified body structures: Secondary | ICD-10-CM

## 2022-09-27 DIAGNOSIS — Z79899 Other long term (current) drug therapy: Secondary | ICD-10-CM | POA: Insufficient documentation

## 2022-09-27 DIAGNOSIS — Z21 Asymptomatic human immunodeficiency virus [HIV] infection status: Secondary | ICD-10-CM | POA: Insufficient documentation

## 2022-09-27 DIAGNOSIS — Z7902 Long term (current) use of antithrombotics/antiplatelets: Secondary | ICD-10-CM | POA: Diagnosis not present

## 2022-09-27 DIAGNOSIS — M25511 Pain in right shoulder: Secondary | ICD-10-CM | POA: Diagnosis not present

## 2022-09-27 DIAGNOSIS — I639 Cerebral infarction, unspecified: Secondary | ICD-10-CM | POA: Insufficient documentation

## 2022-09-27 DIAGNOSIS — S299XXA Unspecified injury of thorax, initial encounter: Secondary | ICD-10-CM | POA: Diagnosis not present

## 2022-09-27 DIAGNOSIS — I700 Atherosclerosis of aorta: Secondary | ICD-10-CM | POA: Diagnosis not present

## 2022-09-27 DIAGNOSIS — I672 Cerebral atherosclerosis: Secondary | ICD-10-CM | POA: Diagnosis not present

## 2022-09-27 DIAGNOSIS — M25562 Pain in left knee: Secondary | ICD-10-CM | POA: Diagnosis not present

## 2022-09-27 DIAGNOSIS — R531 Weakness: Secondary | ICD-10-CM | POA: Diagnosis not present

## 2022-09-27 DIAGNOSIS — I120 Hypertensive chronic kidney disease with stage 5 chronic kidney disease or end stage renal disease: Secondary | ICD-10-CM | POA: Insufficient documentation

## 2022-09-27 DIAGNOSIS — Z452 Encounter for adjustment and management of vascular access device: Secondary | ICD-10-CM | POA: Diagnosis not present

## 2022-09-27 DIAGNOSIS — Z043 Encounter for examination and observation following other accident: Secondary | ICD-10-CM | POA: Diagnosis not present

## 2022-09-27 DIAGNOSIS — S3993XA Unspecified injury of pelvis, initial encounter: Secondary | ICD-10-CM | POA: Diagnosis not present

## 2022-09-27 DIAGNOSIS — I517 Cardiomegaly: Secondary | ICD-10-CM | POA: Diagnosis not present

## 2022-09-27 DIAGNOSIS — S0993XA Unspecified injury of face, initial encounter: Secondary | ICD-10-CM | POA: Diagnosis present

## 2022-09-27 MED ORDER — ACETAMINOPHEN 500 MG PO TABS
1000.0000 mg | ORAL_TABLET | ORAL | Status: AC
  Administered 2022-09-27: 1000 mg via ORAL
  Filled 2022-09-27: qty 2

## 2022-09-27 NOTE — Discharge Instructions (Signed)
No traumatic findings were discovered on your imaging.  Your CT scan did show that you have multiple nodules on your thyroid which will need to be further evaluated.  I recommend that you follow-up with your primary care doctor.  If you have had prior imaging such as an ultrasound this may not be necessary.

## 2022-09-27 NOTE — ED Provider Notes (Signed)
[facility] EMERGENCY DEPARTMENT Provider Note   CSN: [id number] Arrival date & time: 09/27/22  0221     History  Chief Complaint  Patient presents with   Lytle Michaels    Richard [name] is a 67 y.o. male.   Fall  Patient is a 67 year old male right-sided hemiparesis from old stroke living in a nursing facility currently, is a dialysis patient has a left upper extremity fistula as well as a right-sided chest wall hemodialysis catheter which he is currently using for dialysis last dialysis session was Thursday he is a Tuesday Thursday Saturday dialysis schedule. Also with a history of meningeal encephalitis, recurrent falls, hematomas, hypertension, stroke, DM2, HIV  Patient was brought in by EMS after fall out of wheelchair falling forward and struck head on the ground.  Has a small nonbleeding laceration on his forehead that is approximately a centimeter. Patient endorses pain in his right shoulder and left knee as well as his head but states that his shoulder and knee are what are bothering him primarily.  He is bedbound because of the above described hemiparesis.  He is in his normal mental status which is alert and oriented     Home Medications Prior to Admission medications   Medication Sig Start Date End Date Taking? Authorizing Provider  ACCU-CHEK AVIVA PLUS test strip USE 1 strip TO test blood sugar 2 TIMES DAILY TO 3 TIMES DAILY 06/10/19   [name], MD  acetaminophen (TYLENOL) 325 MG tablet Take 650 mg by mouth every 6 (six) hours as needed for moderate pain, fever or mild pain.    [provider]  b complex-vitamin c-folic acid (NEPHRO-VITE) 0.8 MG TABS tablet Take 1 tablet by mouth daily.    [provider]  bictegravir-emtricitabine-tenofovir AF (BIKTARVY) 50-200-25 MG TABS tablet Take 1 tablet by mouth daily. 09/27/22   Dwyane Dee, MD  blood glucose meter kit and supplies KIT Dispense based on patient and insurance  preference. Check blood sugar 4 times a day 06/11/18   [name], MD  calcium acetate (PHOSLO) 667 MG capsule Take 2,001 mg by mouth 3 (three) times daily with meals.    [provider]  carvedilol (COREG) 6.25 MG tablet Take 1 tablet (6.25 mg total) by mouth 2 (two) times daily with a meal. 09/26/22   Dwyane Dee, MD  clopidogrel (PLAVIX) 75 MG tablet Take 1 tablet (75 mg total) by mouth daily. 03/07/20   [name], MD  docusate sodium (COLACE) 100 MG capsule Take 100 mg by mouth daily.    [provider]  doxepin (SINEQUAN) 75 MG capsule Take 75 mg by mouth at bedtime.    [provider]  epoetin alfa-epbx (RETACRIT) 62952 UNIT/ML injection Inject 20,000 Units into the skin every Monday.    [provider]  Ergocalciferol 50 MCG (2000 UT) TABS Take 2,000 Units by mouth daily.    [provider]  hydrALAZINE (APRESOLINE) 10 MG tablet Take 10 mg by mouth 3 (three) times daily.    [provider]  hydrocortisone cream 1 % Apply 1 Application topically every 6 (six) hours as needed for itching. Apply to affected areas on back arms    [provider]  insulin glargine-yfgn (SEMGLEE) 100 UNIT/ML Pen Inject 5 Units into the skin at bedtime.    [provider]  Insulin Pen Needle (SURE COMFORT PEN NEEDLES) 31G X 8 MM MISC Use as directed twice daily with Novolog flex pen 02/10/19  [name], MD  levETIRAcetam (KEPPRA) 500 MG tablet Take 500 mg by mouth 2 (two) times daily.    [provider]  Lidocaine 3 % CREA Apply 1 application topically See admin instructions. Apply 1 times a day on Tuesday, Thursday and Saturday prior to dialysis    [provider]  Misc. Devices (COMMODE 3-IN-1) MISC Use as directed 02/24/20   [name], MD  Misc. Devices (TRANSFER BENCH) MISC Use as directed 02/24/20   Koberlein, [name], MD  Nutritional Supplements (FEEDING SUPPLEMENT, NEPRO CARB STEADY,)  LIQD Take 237 mLs by mouth 3 (three) times daily between meals.    [provider]  ondansetron (ZOFRAN) 4 MG tablet Take 4 mg by mouth every 8 (eight) hours as needed for vomiting or nausea. 12/09/21   [provider]  oxyCODONE (OXY IR/ROXICODONE) 5 MG immediate release tablet Take 1 tablet (5 mg total) by mouth every 4 (four) hours as needed for severe pain or breakthrough pain. 09/26/22   Dwyane Dee, MD  pantoprazole (PROTONIX) 40 MG tablet TAKE 1 TABLET BY MOUTH EVERY DAY Patient taking differently: Take 40 mg by mouth daily. 01/23/20   Koberlein, [name], MD  rosuvastatin (CRESTOR) 5 MG tablet Take 5 mg by mouth daily. 12/03/21   [provider]  tamsulosin (FLOMAX) 0.4 MG CAPS capsule TAKE 1 CAPSULE BY MOUTH EVERY DAY Patient taking differently: Take 0.4 mg by mouth daily. 03/28/20   [name], MD  triamcinolone cream (KENALOG) 0.1 % Apply 1 Application topically in the morning and at bedtime. Apply to back and arms for rash twice a day 08/07/22   [provider]  Thermalito X 5/16" 1 ML MISC Use pen needle with insulin 2 times daily 03/31/19   [name], MD      Allergies    Oxycodone    Review of Systems   Review of Systems  Physical Exam Updated Vital Signs BP 122/77 (BP Location: Right Arm)   Pulse 81   Temp (!) 97.4 F (36.3 C) (Oral)   Resp 16   Ht 6' (1.829 m)   Wt 67.5 kg   SpO2 93%   BMI 20.18 kg/m  Physical Exam Vitals and nursing note reviewed.  Constitutional:      General: He is not in acute distress. HENT:     Head: Normocephalic and atraumatic.     Nose: Nose normal.     Mouth/Throat:     Mouth: Mucous membranes are moist.  Eyes:     General: No scleral icterus. Cardiovascular:     Rate and Rhythm: Normal rate and regular rhythm.     Pulses: Normal pulses.     Heart sounds: Normal heart sounds.     Comments: Fistula left upper extremity with good thrill, right chest wall  hemodialysis catheter Pulmonary:     Effort: Pulmonary effort is normal. No respiratory distress.     Breath sounds: No wheezing.  Abdominal:     Palpations: Abdomen is soft.     Tenderness: There is no abdominal tenderness. There is no guarding or rebound.  Musculoskeletal:     Cervical back: Normal range of motion.     Right lower leg: No edema.     Left lower leg: No edema.     Comments: In c-collar, no T or L-spine tenderness with logroll.  Some right shoulder discomfort with range of motion and same with left knee however no tenderness in either  these areas.  No other extremity tenderness no significant chest wall or abdominal tenderness  Skin:    General: Skin is warm and dry.     Capillary Refill: Capillary refill takes less than 2 seconds.  Neurological:     Mental Status: He is alert. Mental status is at baseline.     Comments: At baseline which includes a degree of aphasia, slow speech and right-sided hemiparesis  Psychiatric:        Mood and Affect: Mood normal.        Behavior: Behavior normal.    ED Results / Procedures / Treatments   Labs (all labs ordered are listed, but only abnormal results are displayed) Labs Reviewed - No data to display  EKG None  Radiology CT HEAD WO CONTRAST (5MM)  Result Date: 09/27/2022 CLINICAL DATA:  Fall EXAM: CT HEAD WITHOUT CONTRAST CT CERVICAL SPINE WITHOUT CONTRAST TECHNIQUE: Multidetector CT imaging of the head and cervical spine was performed following the standard protocol without intravenous contrast. Multiplanar CT image reconstructions of the cervical spine were also generated. RADIATION DOSE REDUCTION: This exam was performed according to the departmental dose-optimization program which includes automated exposure control, adjustment of the mA and/or kV according to patient size and/or use of iterative reconstruction technique. COMPARISON:  CT head 07/22/2022, CT head and cervical spine 12/19/2021 FINDINGS: Brain: No evidence  of acute infarct, hemorrhage, mass, mass effect, or midline shift. No hydrocephalus or extra-axial fluid collection. Periventricular white matter changes, likely the sequela of chronic small vessel ischemic disease. Vascular: No hyperdense vessel. Atherosclerotic calcifications in the intracranial carotid and vertebral arteries. Skull: Normal. Negative for fracture or focal lesion. Sinuses/Orbits: No acute finding. Other: The mastoid air cells are well aerated. CT CERVICAL SPINE FINDINGS Alignment: No listhesis. Skull base and vertebrae: No acute fracture. No primary bone lesion or focal pathologic process. Soft tissues and spinal canal: No prevertebral fluid or swelling. No visible canal hematoma. Disc levels: Multilevel degenerative changes, with disc height loss most prominently at C3-C4. Mild spinal canal stenosis at C3-C4. Multilevel uncovertebral and facet arthropathy. Upper chest: Right greater than left apical scarring. No focal pulmonary opacity or pleural effusion. Other: Multiple calcifications and hypoenhancing nodules in the right thyroid lobe, with a 1.8 cm exophytic lesion extending from the right thyroid lobe (series 4, image 84). IMPRESSION: 1. No acute intracranial process. 2. No acute fracture or traumatic listhesis in the cervical spine. 3. Multiple calcifications and hypoenhancing nodules in the right thyroid lobe, with a 1.8 cm exophytic lesion extending from the right thyroid lobe. If this has not previously been evaluated, a non-emergent ultrasound of the thyroid is recommended. (Reference: J Am Coll Radiol. 2015 Feb;12(2): 143-50) Electronically Signed   By: Merilyn Baba M.D.   On: 09/27/2022 03:24   CT Cervical Spine Wo Contrast  Result Date: 09/27/2022 CLINICAL DATA:  Fall EXAM: CT HEAD WITHOUT CONTRAST CT CERVICAL SPINE WITHOUT CONTRAST TECHNIQUE: Multidetector CT imaging of the head and cervical spine was performed following the standard protocol without intravenous contrast.  Multiplanar CT image reconstructions of the cervical spine were also generated. RADIATION DOSE REDUCTION: This exam was performed according to the departmental dose-optimization program which includes automated exposure control, adjustment of the mA and/or kV according to patient size and/or use of iterative reconstruction technique. COMPARISON:  CT head 07/22/2022, CT head and cervical spine 12/19/2021 FINDINGS: Brain: No evidence of acute infarct, hemorrhage, mass, mass effect, or midline shift. No hydrocephalus or extra-axial fluid collection. Periventricular white matter changes,  likely the sequela of chronic small vessel ischemic disease. Vascular: No hyperdense vessel. Atherosclerotic calcifications in the intracranial carotid and vertebral arteries. Skull: Normal. Negative for fracture or focal lesion. Sinuses/Orbits: No acute finding. Other: The mastoid air cells are well aerated. CT CERVICAL SPINE FINDINGS Alignment: No listhesis. Skull base and vertebrae: No acute fracture. No primary bone lesion or focal pathologic process. Soft tissues and spinal canal: No prevertebral fluid or swelling. No visible canal hematoma. Disc levels: Multilevel degenerative changes, with disc height loss most prominently at C3-C4. Mild spinal canal stenosis at C3-C4. Multilevel uncovertebral and facet arthropathy. Upper chest: Right greater than left apical scarring. No focal pulmonary opacity or pleural effusion. Other: Multiple calcifications and hypoenhancing nodules in the right thyroid lobe, with a 1.8 cm exophytic lesion extending from the right thyroid lobe (series 4, image 84). IMPRESSION: 1. No acute intracranial process. 2. No acute fracture or traumatic listhesis in the cervical spine. 3. Multiple calcifications and hypoenhancing nodules in the right thyroid lobe, with a 1.8 cm exophytic lesion extending from the right thyroid lobe. If this has not previously been evaluated, a non-emergent ultrasound of the thyroid  is recommended. (Reference: J Am Coll Radiol. 2015 Feb;12(2): 143-50) Electronically Signed   By: Merilyn Baba M.D.   On: 09/27/2022 03:24   DG Knee Complete 4 Views Left  Result Date: 09/27/2022 CLINICAL DATA:  Fall, left knee pain EXAM: LEFT KNEE - COMPLETE 4+ VIEW COMPARISON:  None Available. FINDINGS: Normal alignment. No acute fracture or dislocation. No effusion. Advanced vascular calcifications noted. IMPRESSION: 1. No acute fracture or dislocation. Electronically Signed   By: Fidela Salisbury M.D.   On: 09/27/2022 03:04   DG Shoulder Right  Result Date: 09/27/2022 CLINICAL DATA:  Right shoulder pain after fall EXAM: RIGHT SHOULDER - 2+ VIEW COMPARISON:  08/08/2020 FINDINGS: No dislocation. No definite acute fracture. Superior subluxation of the humeral head in relation to the glenoid compatible with rotator cuff pathology. Degenerative arthritis glenohumeral and AC joints. Partially visualized right IJ CVC. IMPRESSION: No acute fracture or dislocation. Electronically Signed   By: Placido Sou M.D.   On: 09/27/2022 03:03   DG Pelvis Portable  Result Date: 09/27/2022 CLINICAL DATA:  Trauma EXAM: PORTABLE PELVIS 1-2 VIEWS COMPARISON:  11/04/2021 and CT 09/19/2022 FINDINGS: Redemonstrated internal rotation of the right hip which limits evaluation of the right femoral neck. Demineralization. No definite acute fracture. No dislocation. Degenerative changes pubic symphysis, both hips, SI joints and lower lumbar spine. IMPRESSION: No definite acute fracture. Electronically Signed   By: Placido Sou M.D.   On: 09/27/2022 02:47   DG Chest Port 1 View  Result Date: 09/27/2022 CLINICAL DATA:  Trauma, fall. EXAM: PORTABLE CHEST 1 VIEW COMPARISON:  08/31/2022. FINDINGS: The heart is enlarged and the mediastinal contour stable. There is atherosclerotic calcification of the aorta. A stable right internal jugular central venous catheter is noted. The lungs are clear without effusions or infiltrates. No  pneumothorax. No acute osseous abnormality. IMPRESSION: 1. No active disease. 2. Cardiomegaly. Electronically Signed   By: Brett Fairy M.D.   On: 09/27/2022 02:47    Procedures Procedures    Medications Ordered in ED Medications  acetaminophen (TYLENOL) tablet 1,000 mg (1,000 mg Oral Given 09/27/22 0352)    ED Course/ Medical Decision Making/ A&P Clinical Course as of 09/27/22 0404  Sat Sep 27, 2022  0314 Xrays of knee/shoulder/pelvis/chest unremarkable.  [WF]    Clinical Course User Index [WF] Tedd Sias, [place]  Medical Decision Making Amount and/or Complexity of Data Reviewed Radiology: ordered.  Risk OTC drugs.   This patient presents to the ED for concern of fall, this involves a number of treatment options, and is a complaint that carries with it a moderate risk of complications and morbidity. A differential diagnosis was considered for the patient's symptoms which is discussed below:   Fractures, intracranial and intra-abdominal thoracic hemorrhage, contusions, abrasions lacerations   Co morbidities: Discussed in HPI   Brief History:  Patient is a 67 year old male right-sided hemiparesis from old stroke living in a nursing facility currently, is a dialysis patient has a left upper extremity fistula as well as a right-sided chest wall hemodialysis catheter which he is currently using for dialysis last dialysis session was Thursday he is a Tuesday Thursday Saturday dialysis schedule. Also with a history of meningeal encephalitis, recurrent falls, hematomas, hypertension, stroke, DM2, HIV  Patient was brought in by EMS after fall out of wheelchair falling forward and struck head on the ground.  Has a small nonbleeding laceration on his forehead that is approximately a centimeter. Patient endorses pain in his right shoulder and left knee as well as his head but states that his shoulder and knee are what are bothering him primarily.  He is  bedbound because of the above described hemiparesis.  He is in his normal mental status which is alert and oriented    EMR reviewed including pt PMHx, past surgical history and past visits to ER.   See HPI for more details   Lab Tests:  Shared decision-making oversedation patient who preferred to hold off on labs he is compliant with his dialysis regimen.   Imaging Studies:  Abnormal findings. I personally reviewed all imaging studies. Imaging notable for  IMPRESSION:  1. No acute intracranial process.  2. No acute fracture or traumatic listhesis in the cervical spine.  3. Multiple calcifications and hypoenhancing nodules in the right  thyroid lobe, with a 1.8 cm exophytic lesion extending from the  right thyroid lobe. If this has not previously been evaluated, a  non-emergent ultrasound of the thyroid is recommended.        Electronically Signed    By: Merilyn Baba M.D.    On: 09/27/2022 03:24  \ I personally viewed x-rays of the shoulder pelvis chest all without fractures or pneumothorax or other acute abnormal findings  Cardiac Monitoring:  The patient was maintained on a cardiac monitor.  I personally viewed and interpreted the cardiac monitored which showed an underlying rhythm of: NSR EKG non-ischemic   Medicines ordered:  I ordered medication including Tylenol for pain Reevaluation of the patient after these medicines showed that the patient improved I have reviewed the patients home medicines and have made adjustments as needed   Critical Interventions:     Consults/Attending Physician   I discussed this case with my attending physician who cosigned this note including patient's presenting symptoms, physical exam, and planned diagnostics and interventions. Attending physician stated agreement with plan or made changes to plan which were implemented.   Attending physician assessed patient at bedside.    Reevaluation:  After the interventions noted  above I re-evaluated patient and found that they have :improved   Social Determinants of Health:      Problem List / ED Course:  Head trauma on anticoagulation Eliquis per facility.  Imaging normal.  There is a dialysis patient will need to have dialysis tomorrow declines labs.  Mentating normally.   Dispostion:  After consideration of the diagnostic results and the patients response to treatment, I feel that the patent would benefit from discharge home with outpatient follow-up and dialysis   Final Clinical Impression(s) / ED Diagnoses Final diagnoses:  Fall, initial encounter  Abnormal imaging of thyroid    Rx / DC Orders ED Discharge Orders     None         Tedd Sias, [place] 09/28/22 0235    Maudie Flakes, MD 09/28/22 [phone]

## 2022-09-27 NOTE — ED Triage Notes (Signed)
Pt BIB [facility] from [facility] facility. Pt  had unwitnessed fall from wheelchair, falling forward hitting head on the floor; on Eliquis. 2 cm lac noted on forehead. GCS 15; c-collar on.

## 2022-09-29 DIAGNOSIS — K859 Acute pancreatitis without necrosis or infection, unspecified: Secondary | ICD-10-CM | POA: Diagnosis not present

## 2022-09-29 DIAGNOSIS — N186 End stage renal disease: Secondary | ICD-10-CM | POA: Diagnosis not present

## 2022-09-29 DIAGNOSIS — B20 Human immunodeficiency virus [HIV] disease: Secondary | ICD-10-CM | POA: Diagnosis not present

## 2022-09-30 ENCOUNTER — Encounter (HOSPITAL_COMMUNITY): Payer: Self-pay

## 2022-09-30 ENCOUNTER — Emergency Department (HOSPITAL_COMMUNITY): Payer: No Typology Code available for payment source

## 2022-09-30 ENCOUNTER — Emergency Department (HOSPITAL_COMMUNITY)
Admission: EM | Admit: 2022-09-30 | Discharge: 2022-09-30 | Disposition: A | Discharge status: Home / Self Care | Payer: No Typology Code available for payment source | Attending: Emergency Medicine | Admitting: Emergency Medicine

## 2022-09-30 ENCOUNTER — Other Ambulatory Visit: Payer: Self-pay

## 2022-09-30 DIAGNOSIS — I120 Hypertensive chronic kidney disease with stage 5 chronic kidney disease or end stage renal disease: Secondary | ICD-10-CM | POA: Diagnosis not present

## 2022-09-30 DIAGNOSIS — R531 Weakness: Secondary | ICD-10-CM | POA: Diagnosis not present

## 2022-09-30 DIAGNOSIS — Z1152 Encounter for screening for COVID-19: Secondary | ICD-10-CM | POA: Diagnosis not present

## 2022-09-30 DIAGNOSIS — R0602 Shortness of breath: Secondary | ICD-10-CM | POA: Diagnosis not present

## 2022-09-30 DIAGNOSIS — Z7902 Long term (current) use of antithrombotics/antiplatelets: Secondary | ICD-10-CM | POA: Insufficient documentation

## 2022-09-30 DIAGNOSIS — Z21 Asymptomatic human immunodeficiency virus [HIV] infection status: Secondary | ICD-10-CM | POA: Diagnosis not present

## 2022-09-30 DIAGNOSIS — Z8673 Personal history of transient ischemic attack (TIA), and cerebral infarction without residual deficits: Secondary | ICD-10-CM | POA: Diagnosis not present

## 2022-09-30 DIAGNOSIS — E1122 Type 2 diabetes mellitus with diabetic chronic kidney disease: Secondary | ICD-10-CM | POA: Diagnosis not present

## 2022-09-30 DIAGNOSIS — R0902 Hypoxemia: Secondary | ICD-10-CM | POA: Diagnosis present

## 2022-09-30 DIAGNOSIS — Z992 Dependence on renal dialysis: Secondary | ICD-10-CM | POA: Diagnosis not present

## 2022-09-30 DIAGNOSIS — R0603 Acute respiratory distress: Secondary | ICD-10-CM | POA: Diagnosis not present

## 2022-09-30 DIAGNOSIS — Z794 Long term (current) use of insulin: Secondary | ICD-10-CM | POA: Diagnosis not present

## 2022-09-30 DIAGNOSIS — N186 End stage renal disease: Secondary | ICD-10-CM | POA: Diagnosis not present

## 2022-09-30 DIAGNOSIS — Z7401 Bed confinement status: Secondary | ICD-10-CM | POA: Diagnosis not present

## 2022-09-30 LAB — RESP PANEL BY RT-PCR (RSV, FLU A&B, COVID)  RVPGX2
Influenza A by PCR: NEGATIVE
Influenza B by PCR: NEGATIVE
Resp Syncytial Virus by PCR: NEGATIVE
SARS Coronavirus 2 by RT PCR: NEGATIVE

## 2022-09-30 LAB — CBG MONITORING, ED: Glucose-Capillary: 128 mg/dL — ABNORMAL HIGH (ref 70–99)

## 2022-09-30 NOTE — ED Notes (Signed)
Pt to be transported back to facility by [facility].

## 2022-09-30 NOTE — Discharge Instructions (Signed)
Your x-ray was reassuring and your oxygen levels not low here.  You have refused blood work.

## 2022-09-30 NOTE — ED Notes (Signed)
Patient refused lab draw. Patient encouraged labs per provider's ordered. Patient declined. RN is aware.

## 2022-09-30 NOTE — ED Provider Triage Note (Signed)
Emergency Medicine Provider Triage Evaluation Note  Richard [name] , a 67 y.o. male  was evaluated in triage.  Pt complains sent here for low oxygen levels  Pt has had a cva   Review of Systems  Positive: (Pt unable to provide) Negative:   Physical Exam  BP 102/70 (BP Location: Right Arm)   Pulse 92   Temp 98.2 F (36.8 C) (Oral)   Resp 18   SpO2 96%  Gen:   Awake,   Resp:  Normal effort  MSK:   Contracted  pt has had a cva   Medical Decision Making  Medically screening exam initiated at 1:02 PM.  Appropriate orders placed.  Richard [name] was informed that the remainder of the evaluation will be completed by another provider, this initial triage assessment does not replace that evaluation, and the importance of remaining in the ED until their evaluation is complete.     Fransico Meadow, [place] 09/30/22 1304

## 2022-09-30 NOTE — ED Provider Notes (Signed)
[facility] EMERGENCY DEPARTMENT Provider Note   CSN: [id number] Arrival date & time: 09/30/22  1223     History  Chief Complaint  Patient presents with   low Sa02    Richard [name] is a 67 y.o. male.  HPI Patient sent in from nursing home.  Reportedly had hypoxia.  Reportedly sats in 40% to 70%.  Reportedly was also low for EMS.  However upon arrival EMS reportedly said hands were cold.  Pulse ox probe put on forehead and was 100%.  Patient reportedly without complaints during the episode.  Patient not providing much history but states he feels fine.  States his neck hurts because he was sitting on a bag that was around his neck.   Past Medical History:  Diagnosis Date   COVID-19 05/16/2020   Diabetes mellitus, type II, insulin dependent ([place])    ESRD on hemodialysis ([place]) 08/30/2021   GERD (gastroesophageal reflux disease)    Hematoma 03/19/2022   HIV (human immunodeficiency virus infection) ([place])    Hypertension    Meningoencephalitis    Neuropathy in diabetes ([place])    bilat feet   Recurrent falls    possible small SAH right frontal lobe 12/19/21 (Plavix held x 1 week); left transverse foramen C3 fracture 11/04/21   Ruptured lumbar disc    Seizure disorder ([place])    Stroke ([place]) 2013   residual right sided weakness (arm>leg)    Home Medications Prior to Admission medications   Medication Sig Start Date End Date Taking? Authorizing Provider  ACCU-CHEK AVIVA PLUS test strip USE 1 strip TO test blood sugar 2 TIMES DAILY TO 3 TIMES DAILY 06/10/19   [name], MD  acetaminophen (TYLENOL) 325 MG tablet Take 650 mg by mouth every 6 (six) hours as needed for moderate pain, fever or mild pain.    [provider]  b complex-vitamin c-folic acid (NEPHRO-VITE) 0.8 MG TABS tablet Take 1 tablet by mouth daily.    [provider]  bictegravir-emtricitabine-tenofovir AF (BIKTARVY) 50-200-25 MG TABS tablet Take 1 tablet by mouth daily.  09/27/22   Dwyane Dee, MD  blood glucose meter kit and supplies KIT Dispense based on patient and insurance preference. Check blood sugar 4 times a day 06/11/18   [name], MD  calcium acetate (PHOSLO) 667 MG capsule Take 2,001 mg by mouth 3 (three) times daily with meals.    [provider]  carvedilol (COREG) 6.25 MG tablet Take 1 tablet (6.25 mg total) by mouth 2 (two) times daily with a meal. 09/26/22   Dwyane Dee, MD  clopidogrel (PLAVIX) 75 MG tablet Take 1 tablet (75 mg total) by mouth daily. 03/07/20   [name], MD  docusate sodium (COLACE) 100 MG capsule Take 100 mg by mouth daily.    [provider]  doxepin (SINEQUAN) 75 MG capsule Take 75 mg by mouth at bedtime.    [provider]  epoetin alfa-epbx (RETACRIT) 40981 UNIT/ML injection Inject 20,000 Units into the skin every Monday.    [provider]  Ergocalciferol 50 MCG (2000 UT) TABS Take 2,000 Units by mouth daily.    [provider]  hydrALAZINE (APRESOLINE) 10 MG tablet Take 10 mg by mouth 3 (three) times daily.    [provider]  hydrocortisone cream 1 % Apply 1 Application topically every 6 (six) hours as needed for itching. Apply to affected areas on back arms    [provider]  insulin glargine-yfgn (SEMGLEE)  100 UNIT/ML Pen Inject 5 Units into the skin at bedtime.    [provider]  Insulin Pen Needle (SURE COMFORT PEN NEEDLES) 31G X 8 MM MISC Use as directed twice daily with Novolog flex pen 02/10/19   Koberlein, Junell C, MD  levETIRAcetam (KEPPRA) 500 MG tablet Take 500 mg by mouth 2 (two) times daily.    [provider]  Lidocaine 3 % CREA Apply 1 application topically See admin instructions. Apply 1 times a day on Tuesday, Thursday and Saturday prior to dialysis    [provider]  Misc. Devices (COMMODE 3-IN-1) MISC Use as directed 02/24/20   [name], MD  Misc. Devices (TRANSFER BENCH) MISC Use as  directed 02/24/20   Koberlein, [name], MD  Nutritional Supplements (FEEDING SUPPLEMENT, NEPRO CARB STEADY,) LIQD Take 237 mLs by mouth 3 (three) times daily between meals.    [provider]  ondansetron (ZOFRAN) 4 MG tablet Take 4 mg by mouth every 8 (eight) hours as needed for vomiting or nausea. 12/09/21   [provider]  oxyCODONE (OXY IR/ROXICODONE) 5 MG immediate release tablet Take 1 tablet (5 mg total) by mouth every 4 (four) hours as needed for severe pain or breakthrough pain. 09/26/22   Dwyane Dee, MD  pantoprazole (PROTONIX) 40 MG tablet TAKE 1 TABLET BY MOUTH EVERY DAY Patient taking differently: Take 40 mg by mouth daily. 01/23/20   Koberlein, [name], MD  rosuvastatin (CRESTOR) 5 MG tablet Take 5 mg by mouth daily. 12/03/21   [provider]  tamsulosin (FLOMAX) 0.4 MG CAPS capsule TAKE 1 CAPSULE BY MOUTH EVERY DAY Patient taking differently: Take 0.4 mg by mouth daily. 03/28/20   [name], MD  triamcinolone cream (KENALOG) 0.1 % Apply 1 Application topically in the morning and at bedtime. Apply to back and arms for rash twice a day 08/07/22   [provider]  TRUEPLUS INSULIN SYRINGE 31G X 5/16" 1 ML MISC Use pen needle with insulin 2 times daily 03/31/19   [name], MD      Allergies    Oxycodone    Review of Systems   Review of Systems  Physical Exam Updated Vital Signs BP 122/84   Pulse 90   Temp 97.7 F (36.5 C) (Oral)   Resp 15   SpO2 100%  Physical Exam Vitals reviewed.  Pulmonary:     Breath sounds: No wheezing or rhonchi.  Abdominal:     Tenderness: There is no abdominal tenderness.  Musculoskeletal:        General: No tenderness.     Cervical back: Neck supple.  Neurological:     Mental Status: He is alert.     ED Results / Procedures / Treatments   Labs (all labs ordered are listed, but only abnormal results are displayed) Labs Reviewed  CBG MONITORING, ED - Abnormal; Notable for the following  components:      Result Value   Glucose-Capillary 128 (*)    All other components within normal limits  RESP PANEL BY RT-PCR (RSV, FLU A&B, COVID)  RVPGX2  CBC WITH DIFFERENTIAL/PLATELET  COMPREHENSIVE METABOLIC PANEL    EKG None  Radiology DG Chest 1 View  Result Date: 09/30/2022 CLINICAL DATA:  Shortness of breath. Per triage note, patient arrived via EMS for reportedly low O2 sats. Not in respiratory distress at this time. EXAM: CHEST  1 VIEW COMPARISON:  Chest radiograph 09/27/2022. FINDINGS: Unchanged cardiomegaly and mediastinal contours. The lungs are well  aerated. Unchanged positioning of a right IJ approach tunneled central venous catheter with tip projecting over the right atrium. No pleural effusion or pneumothorax. Visualized skeleton is unremarkable. IMPRESSION: 1. No acute cardiopulmonary findings. 2. Unchanged cardiomegaly. Electronically Signed   By: Emmit Alexanders M.D   On: 09/30/2022 13:40    Procedures Procedures    Medications Ordered in ED Medications - No data to display  ED Course/ Medical Decision Making/ A&P                           Medical Decision Making  Patient with reported hypoxia.  However upon arrival was not hypoxic with pulse ox on his forehead.  Reportedly had cold hands.  Potentially could be false reading on the pulse ox.  However chest x-ray reassuring.  No CHF.  Not hypoxic.  Negative flu COVID and RSV.  Patient refusing further blood work.  Will discharge back home to nursing home.        Final Clinical Impression(s) / ED Diagnoses Final diagnoses:  End stage renal disease on dialysis [facility])    Rx / DC Orders ED Discharge Orders     None         Davonna Belling, MD 09/30/22 2053

## 2022-09-30 NOTE — ED Triage Notes (Signed)
Pt arrived via [facility] from [facility] due to they said the Sa02 was 40-69%. Pt not in respiratory distress or showing any signs of hypoxia. Pt arrived on non-rebreather and S02 was reading in 60's for EMS. EMS states pt's fingers are cold.

## 2022-10-02 DIAGNOSIS — K219 Gastro-esophageal reflux disease without esophagitis: Secondary | ICD-10-CM | POA: Diagnosis not present

## 2022-10-02 DIAGNOSIS — N186 End stage renal disease: Secondary | ICD-10-CM | POA: Diagnosis not present

## 2022-10-02 DIAGNOSIS — E119 Type 2 diabetes mellitus without complications: Secondary | ICD-10-CM | POA: Diagnosis not present

## 2022-10-02 DIAGNOSIS — B20 Human immunodeficiency virus [HIV] disease: Secondary | ICD-10-CM | POA: Diagnosis not present

## 2022-10-07 DIAGNOSIS — N2581 Secondary hyperparathyroidism of renal origin: Secondary | ICD-10-CM | POA: Diagnosis not present

## 2022-10-07 DIAGNOSIS — Z992 Dependence on renal dialysis: Secondary | ICD-10-CM | POA: Diagnosis not present

## 2022-10-07 DIAGNOSIS — N186 End stage renal disease: Secondary | ICD-10-CM | POA: Diagnosis not present

## 2022-10-08 DIAGNOSIS — N186 End stage renal disease: Secondary | ICD-10-CM | POA: Diagnosis not present

## 2022-10-08 DIAGNOSIS — M6281 Muscle weakness (generalized): Secondary | ICD-10-CM | POA: Diagnosis not present

## 2022-10-08 DIAGNOSIS — R239 Unspecified skin changes: Secondary | ICD-10-CM | POA: Diagnosis not present

## 2022-10-08 DIAGNOSIS — L89153 Pressure ulcer of sacral region, stage 3: Secondary | ICD-10-CM | POA: Diagnosis not present

## 2022-10-08 DIAGNOSIS — R159 Full incontinence of feces: Secondary | ICD-10-CM | POA: Diagnosis not present

## 2022-10-11 DIAGNOSIS — E119 Type 2 diabetes mellitus without complications: Secondary | ICD-10-CM | POA: Diagnosis not present

## 2022-10-11 DIAGNOSIS — I10 Essential (primary) hypertension: Secondary | ICD-10-CM | POA: Diagnosis not present

## 2022-10-15 ENCOUNTER — Emergency Department (HOSPITAL_COMMUNITY): Payer: No Typology Code available for payment source

## 2022-10-15 ENCOUNTER — Other Ambulatory Visit: Payer: Self-pay

## 2022-10-15 ENCOUNTER — Encounter (HOSPITAL_COMMUNITY): Payer: Self-pay

## 2022-10-15 ENCOUNTER — Inpatient Hospital Stay (HOSPITAL_COMMUNITY)
Admission: EM | Admit: 2022-10-15 | Discharge: 2022-11-21 | DRG: 871 | Disposition: E | Payer: Medicare HMO | Source: Skilled Nursing Facility | Attending: Internal Medicine | Admitting: Internal Medicine

## 2022-10-15 ENCOUNTER — Emergency Department (HOSPITAL_COMMUNITY): Payer: Medicare HMO

## 2022-10-15 DIAGNOSIS — Z1389 Encounter for screening for other disorder: Secondary | ICD-10-CM | POA: Diagnosis not present

## 2022-10-15 DIAGNOSIS — G9341 Metabolic encephalopathy: Secondary | ICD-10-CM | POA: Diagnosis present

## 2022-10-15 DIAGNOSIS — I120 Hypertensive chronic kidney disease with stage 5 chronic kidney disease or end stage renal disease: Secondary | ICD-10-CM | POA: Diagnosis present

## 2022-10-15 DIAGNOSIS — D631 Anemia in chronic kidney disease: Secondary | ICD-10-CM | POA: Diagnosis present

## 2022-10-15 DIAGNOSIS — F17210 Nicotine dependence, cigarettes, uncomplicated: Secondary | ICD-10-CM | POA: Diagnosis present

## 2022-10-15 DIAGNOSIS — R54 Age-related physical debility: Secondary | ICD-10-CM | POA: Diagnosis present

## 2022-10-15 DIAGNOSIS — M19072 Primary osteoarthritis, left ankle and foot: Secondary | ICD-10-CM | POA: Diagnosis not present

## 2022-10-15 DIAGNOSIS — L89150 Pressure ulcer of sacral region, unstageable: Secondary | ICD-10-CM | POA: Diagnosis present

## 2022-10-15 DIAGNOSIS — Z833 Family history of diabetes mellitus: Secondary | ICD-10-CM

## 2022-10-15 DIAGNOSIS — R6520 Severe sepsis without septic shock: Secondary | ICD-10-CM | POA: Diagnosis not present

## 2022-10-15 DIAGNOSIS — A419 Sepsis, unspecified organism: Secondary | ICD-10-CM | POA: Diagnosis present

## 2022-10-15 DIAGNOSIS — Z8616 Personal history of COVID-19: Secondary | ICD-10-CM

## 2022-10-15 DIAGNOSIS — Z91158 Patient's noncompliance with renal dialysis for other reason: Secondary | ICD-10-CM

## 2022-10-15 DIAGNOSIS — B20 Human immunodeficiency virus [HIV] disease: Secondary | ICD-10-CM | POA: Diagnosis not present

## 2022-10-15 DIAGNOSIS — N19 Unspecified kidney failure: Secondary | ICD-10-CM

## 2022-10-15 DIAGNOSIS — Z66 Do not resuscitate: Secondary | ICD-10-CM | POA: Diagnosis not present

## 2022-10-15 DIAGNOSIS — E1152 Type 2 diabetes mellitus with diabetic peripheral angiopathy with gangrene: Secondary | ICD-10-CM | POA: Diagnosis present

## 2022-10-15 DIAGNOSIS — E1122 Type 2 diabetes mellitus with diabetic chronic kidney disease: Secondary | ICD-10-CM | POA: Diagnosis present

## 2022-10-15 DIAGNOSIS — Z8661 Personal history of infections of the central nervous system: Secondary | ICD-10-CM

## 2022-10-15 DIAGNOSIS — E119 Type 2 diabetes mellitus without complications: Secondary | ICD-10-CM | POA: Diagnosis not present

## 2022-10-15 DIAGNOSIS — G40909 Epilepsy, unspecified, not intractable, without status epilepticus: Secondary | ICD-10-CM | POA: Diagnosis present

## 2022-10-15 DIAGNOSIS — Z4682 Encounter for fitting and adjustment of non-vascular catheter: Secondary | ICD-10-CM | POA: Diagnosis not present

## 2022-10-15 DIAGNOSIS — Z8249 Family history of ischemic heart disease and other diseases of the circulatory system: Secondary | ICD-10-CM

## 2022-10-15 DIAGNOSIS — E1169 Type 2 diabetes mellitus with other specified complication: Secondary | ICD-10-CM | POA: Diagnosis present

## 2022-10-15 DIAGNOSIS — Z1152 Encounter for screening for COVID-19: Secondary | ICD-10-CM | POA: Diagnosis not present

## 2022-10-15 DIAGNOSIS — E1140 Type 2 diabetes mellitus with diabetic neuropathy, unspecified: Secondary | ICD-10-CM | POA: Diagnosis present

## 2022-10-15 DIAGNOSIS — N186 End stage renal disease: Secondary | ICD-10-CM | POA: Diagnosis present

## 2022-10-15 DIAGNOSIS — Z823 Family history of stroke: Secondary | ICD-10-CM

## 2022-10-15 DIAGNOSIS — R0902 Hypoxemia: Secondary | ICD-10-CM | POA: Diagnosis not present

## 2022-10-15 DIAGNOSIS — N400 Enlarged prostate without lower urinary tract symptoms: Secondary | ICD-10-CM | POA: Diagnosis not present

## 2022-10-15 DIAGNOSIS — Z820 Family history of epilepsy and other diseases of the nervous system: Secondary | ICD-10-CM

## 2022-10-15 DIAGNOSIS — Z992 Dependence on renal dialysis: Secondary | ICD-10-CM | POA: Diagnosis not present

## 2022-10-15 DIAGNOSIS — E8729 Other acidosis: Secondary | ICD-10-CM | POA: Diagnosis present

## 2022-10-15 DIAGNOSIS — E785 Hyperlipidemia, unspecified: Secondary | ICD-10-CM | POA: Diagnosis not present

## 2022-10-15 DIAGNOSIS — Z01818 Encounter for other preprocedural examination: Secondary | ICD-10-CM | POA: Diagnosis not present

## 2022-10-15 DIAGNOSIS — Z21 Asymptomatic human immunodeficiency virus [HIV] infection status: Secondary | ICD-10-CM | POA: Diagnosis present

## 2022-10-15 DIAGNOSIS — Z7902 Long term (current) use of antithrombotics/antiplatelets: Secondary | ICD-10-CM

## 2022-10-15 DIAGNOSIS — E861 Hypovolemia: Secondary | ICD-10-CM | POA: Diagnosis not present

## 2022-10-15 DIAGNOSIS — I69351 Hemiplegia and hemiparesis following cerebral infarction affecting right dominant side: Secondary | ICD-10-CM | POA: Diagnosis not present

## 2022-10-15 DIAGNOSIS — I96 Gangrene, not elsewhere classified: Principal | ICD-10-CM | POA: Diagnosis present

## 2022-10-15 DIAGNOSIS — E878 Other disorders of electrolyte and fluid balance, not elsewhere classified: Secondary | ICD-10-CM | POA: Diagnosis present

## 2022-10-15 DIAGNOSIS — I10 Essential (primary) hypertension: Secondary | ICD-10-CM | POA: Diagnosis not present

## 2022-10-15 DIAGNOSIS — E860 Dehydration: Secondary | ICD-10-CM | POA: Diagnosis present

## 2022-10-15 DIAGNOSIS — Z515 Encounter for palliative care: Secondary | ICD-10-CM | POA: Diagnosis not present

## 2022-10-15 DIAGNOSIS — Y835 Amputation of limb(s) as the cause of abnormal reaction of the patient, or of later complication, without mention of misadventure at the time of the procedure: Secondary | ICD-10-CM | POA: Diagnosis present

## 2022-10-15 DIAGNOSIS — R4182 Altered mental status, unspecified: Secondary | ICD-10-CM | POA: Diagnosis not present

## 2022-10-15 DIAGNOSIS — Z794 Long term (current) use of insulin: Secondary | ICD-10-CM | POA: Diagnosis not present

## 2022-10-15 DIAGNOSIS — I700 Atherosclerosis of aorta: Secondary | ICD-10-CM | POA: Diagnosis not present

## 2022-10-15 DIAGNOSIS — G9349 Other encephalopathy: Secondary | ICD-10-CM | POA: Diagnosis present

## 2022-10-15 DIAGNOSIS — Z681 Body mass index (BMI) 19 or less, adult: Secondary | ICD-10-CM | POA: Diagnosis not present

## 2022-10-15 DIAGNOSIS — N185 Chronic kidney disease, stage 5: Secondary | ICD-10-CM | POA: Diagnosis not present

## 2022-10-15 DIAGNOSIS — B9689 Other specified bacterial agents as the cause of diseases classified elsewhere: Secondary | ICD-10-CM | POA: Diagnosis present

## 2022-10-15 DIAGNOSIS — E559 Vitamin D deficiency, unspecified: Secondary | ICD-10-CM | POA: Diagnosis not present

## 2022-10-15 DIAGNOSIS — Z993 Dependence on wheelchair: Secondary | ICD-10-CM

## 2022-10-15 DIAGNOSIS — T8744 Infection of amputation stump, left lower extremity: Secondary | ICD-10-CM | POA: Diagnosis present

## 2022-10-15 DIAGNOSIS — R531 Weakness: Secondary | ICD-10-CM | POA: Diagnosis not present

## 2022-10-15 DIAGNOSIS — L089 Local infection of the skin and subcutaneous tissue, unspecified: Secondary | ICD-10-CM | POA: Diagnosis present

## 2022-10-15 DIAGNOSIS — Z452 Encounter for adjustment and management of vascular access device: Secondary | ICD-10-CM | POA: Diagnosis not present

## 2022-10-15 DIAGNOSIS — S91302A Unspecified open wound, left foot, initial encounter: Secondary | ICD-10-CM | POA: Diagnosis not present

## 2022-10-15 DIAGNOSIS — E43 Unspecified severe protein-calorie malnutrition: Secondary | ICD-10-CM | POA: Diagnosis present

## 2022-10-15 DIAGNOSIS — K219 Gastro-esophageal reflux disease without esophagitis: Secondary | ICD-10-CM | POA: Diagnosis present

## 2022-10-15 DIAGNOSIS — R410 Disorientation, unspecified: Secondary | ICD-10-CM | POA: Diagnosis not present

## 2022-10-15 DIAGNOSIS — E875 Hyperkalemia: Secondary | ICD-10-CM | POA: Diagnosis present

## 2022-10-15 DIAGNOSIS — Z885 Allergy status to narcotic agent status: Secondary | ICD-10-CM

## 2022-10-15 DIAGNOSIS — I169 Hypertensive crisis, unspecified: Secondary | ICD-10-CM | POA: Diagnosis not present

## 2022-10-15 DIAGNOSIS — Z803 Family history of malignant neoplasm of breast: Secondary | ICD-10-CM

## 2022-10-15 DIAGNOSIS — R6521 Severe sepsis with septic shock: Secondary | ICD-10-CM | POA: Diagnosis present

## 2022-10-15 DIAGNOSIS — Z7189 Other specified counseling: Secondary | ICD-10-CM | POA: Diagnosis not present

## 2022-10-15 DIAGNOSIS — Z79899 Other long term (current) drug therapy: Secondary | ICD-10-CM

## 2022-10-15 DIAGNOSIS — Z91A58 Caregiver's noncompliance with patient's renal dialysis for other reason: Secondary | ICD-10-CM

## 2022-10-15 DIAGNOSIS — G9340 Encephalopathy, unspecified: Secondary | ICD-10-CM

## 2022-10-15 DIAGNOSIS — N250 Renal osteodystrophy: Secondary | ICD-10-CM | POA: Diagnosis present

## 2022-10-15 LAB — LIPASE, BLOOD: Lipase: 42 U/L (ref 11–51)

## 2022-10-15 LAB — COMPREHENSIVE METABOLIC PANEL
ALT: 14 U/L (ref 0–44)
AST: 23 U/L (ref 15–41)
Albumin: 2.7 g/dL — ABNORMAL LOW (ref 3.5–5.0)
Alkaline Phosphatase: 119 U/L (ref 38–126)
Anion gap: 29 — ABNORMAL HIGH (ref 5–15)
BUN: 172 mg/dL — ABNORMAL HIGH (ref 8–23)
CO2: 19 mmol/L — ABNORMAL LOW (ref 22–32)
Calcium: 10.4 mg/dL — ABNORMAL HIGH (ref 8.9–10.3)
Chloride: 96 mmol/L — ABNORMAL LOW (ref 98–111)
Creatinine, Ser: 22.57 mg/dL — ABNORMAL HIGH (ref 0.61–1.24)
GFR, Estimated: 2 mL/min — ABNORMAL LOW (ref >60)
Glucose, Bld: 170 mg/dL — ABNORMAL HIGH (ref 70–99)
Potassium: >7.5 mmol/L (ref 3.5–5.1)
Sodium: 144 mmol/L (ref 135–145)
Total Bilirubin: 0.7 mg/dL (ref 0.3–1.2)
Total Protein: 7.9 g/dL (ref 6.5–8.1)

## 2022-10-15 LAB — RESP PANEL BY RT-PCR (RSV, FLU A&B, COVID)  RVPGX2
Influenza A by PCR: NEGATIVE
Influenza B by PCR: NEGATIVE
Resp Syncytial Virus by PCR: NEGATIVE
SARS Coronavirus 2 by RT PCR: NEGATIVE

## 2022-10-15 LAB — AMMONIA: Ammonia: 35 umol/L (ref 9–35)

## 2022-10-15 LAB — LACTIC ACID, PLASMA: Lactic Acid, Venous: 2.1 mmol/L (ref 0.5–1.9)

## 2022-10-15 MED ORDER — INSULIN ASPART 100 UNIT/ML IV SOLN: 5.0000 [IU] | Freq: Once | INTRAVENOUS | Status: DC

## 2022-10-15 MED ORDER — CALCIUM GLUCONATE-NACL 1-0.675 GM/50ML-% IV SOLN
1.0000 g | Freq: Once | INTRAVENOUS | Status: DC
  Filled 2022-10-15: qty 50

## 2022-10-15 MED ORDER — SODIUM ZIRCONIUM CYCLOSILICATE 10 G PO PACK
10.0000 g | PACK | Freq: Once | ORAL | Status: DC
  Filled 2022-10-15: qty 1

## 2022-10-15 MED ORDER — DEXTROSE 50 % IV SOLN
1.0000 | Freq: Once | INTRAVENOUS | Status: DC
  Filled 2022-10-15: qty 50

## 2022-10-15 NOTE — ED Provider Notes (Addendum)
[facility] Provider Note  CSN: [id number] Arrival date & time: 10/06/2022 1814  Chief Complaint(s) Wound Infection and Altered Mental Status  HPI Richard [name] is a 67 y.o. male with past medical history as below, significant for ESRD on HD T,Th,Sa,  HIV, bilateral nephropathy, prior CVA with residual right-sided deficits, DM2 who presents to the ED with complaint of AMS, foot wound.  Patient arrives nursing facility with concern for wound to his left foot, reduced cognitive status over the past 5 days.  Per nursing facility patient with reduced ambulation over the past 5 days, increased confusion.  Patient is a poor historian, he is intermittently confused, unable to provide much history but does express discomfort on palpation to his left lower extremity.  Level 5 caveat AMS  Past Medical History Past Medical History:  Diagnosis Date  . COVID-19 05/16/2020  . Diabetes mellitus, type II, insulin dependent ([place])   . ESRD on hemodialysis ([place]) 08/30/2021  . GERD (gastroesophageal reflux disease)   . Hematoma 03/19/2022  . HIV (human immunodeficiency virus infection) ([place])   . Hypertension   . Meningoencephalitis   . Neuropathy in diabetes ([place])    bilat feet  . Recurrent falls    possible small SAH right frontal lobe 12/19/21 (Plavix held x 1 week); left transverse foramen C3 fracture 11/04/21  . Ruptured lumbar disc   . Seizure disorder ([place])   . Stroke [facility]) 2013   residual right sided weakness (arm>leg)   Patient Active Problem List   Diagnosis Date Noted  . Acute pancreatitis 09/20/2022  . High anion gap metabolic acidosis 93/81/0175  . Renal failure 09/03/2022  . Acute respiratory failure with hypoxia ([place]) 09/01/2022  . Lack of housing 03/31/2022  . Hematoma 03/19/2022  . Other disorders of calcium metabolism 03/05/2022  . Fall from non-moving wheelchair 10/19/2021  . HTN (hypertension) 10/19/2021  . Hypermagnesemia  10/19/2021  . Allergic rhinitis 10/19/2021  . Buttock pain 08/30/2021  . Volume overload 04/14/2021  . Elevated troponin 02/18/2021  . Acute on chronic diastolic CHF (congestive heart failure) ([place]) 02/18/2021  . Depression 02/18/2021  . Hemorrhoids 01/11/2021  . Hyperkalemia 01/10/2021  . Dialysis patient, noncompliant   . Dependence on supplemental oxygen 12/20/2020  . ESRD (end stage renal disease) on dialysis ([place]) 12/20/2020  . 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 Human immunodeficiency virus (HIV) disease ([place]) 12/20/2020  . 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  . Syncope 06/01/2020  . Secondary hyperparathyroidism of renal origin ([place]) 05/28/2020  . Hypokalemia 05/16/2020  . Allergy, unspecified, initial encounter 05/08/2020  . Anaphylactic shock, unspecified, initial encounter 05/08/2020  . Anemia in chronic kidney disease 05/08/2020  . Complication of vascular dialysis catheter 05/08/2020  . Dependence on renal dialysis ([place]) 05/08/2020  . Headache, unspecified 05/08/2020  . Iron deficiency anemia, unspecified 05/08/2020  . Cerebellar stroke syndrome 05/07/2020  . Encounter for feeding tube placement   . Weakness   . Creatinine elevation   . Goals of care, counseling/discussion   . Hypertensive urgency   . Palliative care by specialist   . AIDS (acquired immune deficiency syndrome) ([place]) 04/13/2020  . Seizure disorder ([place]) 04/10/2020  . History of cerebrovascular accident (CVA) with residual deficit 03/19/2020  . Neuropathy in diabetes ([place])   . Migraines   . GERD (gastroesophageal reflux disease)   . Type 2 diabetes mellitus with hyperlipidemia ([place])   . Arthritis   . Hyperlipidemia  08/25/2012  . Chronic kidney disease, stage 4 (severe) ([place]) 08/23/2012  . Tobacco abuse 08/23/2012  . Malnutrition of moderate degree ([place]) 08/23/2012  . 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  05/21/2012  . Hypertensive emergency 05/21/2012   Home Medication(s) Prior to Admission medications   Medication Sig Start Date End Date Taking? Authorizing Provider  ACCU-CHEK AVIVA PLUS test strip USE 1 strip TO test blood sugar 2 TIMES DAILY TO 3 TIMES DAILY 06/10/19   [name], MD  acetaminophen (TYLENOL) 325 MG tablet Take 650 mg by mouth every 6 (six) hours as needed for moderate pain, fever or mild pain.    [provider]  b complex-vitamin c-folic acid (NEPHRO-VITE) 0.8 MG TABS tablet Take 1 tablet by mouth daily.    [provider]  bictegravir-emtricitabine-tenofovir AF (BIKTARVY) 50-200-25 MG TABS tablet Take 1 tablet by mouth daily. 09/27/22   Dwyane Dee, MD  blood glucose meter kit and supplies KIT Dispense based on patient and insurance preference. Check blood sugar 4 times a day 06/11/18   [name], MD  calcium acetate (PHOSLO) 667 MG capsule Take 2,001 mg by mouth 3 (three) times daily with meals.    [provider]  carvedilol (COREG) 6.25 MG tablet Take 1 tablet (6.25 mg total) by mouth 2 (two) times daily with a meal. 09/26/22   Dwyane Dee, MD  clopidogrel (PLAVIX) 75 MG tablet Take 1 tablet (75 mg total) by mouth daily. 03/07/20   [name], MD  docusate sodium (COLACE) 100 MG capsule Take 100 mg by mouth daily.    [provider]  doxepin (SINEQUAN) 75 MG capsule Take 75 mg by mouth at bedtime.    [provider]  epoetin alfa-epbx (RETACRIT) 03474 UNIT/ML injection Inject 20,000 Units into the skin every Monday.    [provider]  Ergocalciferol 50 MCG (2000 UT) TABS Take 2,000 Units by mouth daily.    [provider]  hydrALAZINE (APRESOLINE) 10 MG tablet Take 10 mg by mouth 3 (three) times daily.    [provider]  hydrocortisone cream 1 % Apply 1 Application topically every 6 (six) hours as needed for itching. Apply to affected areas on back arms    [provider]  insulin glargine-yfgn (SEMGLEE) 100 UNIT/ML Pen Inject 5 Units into the skin at bedtime.    [provider]  Insulin Pen Needle (SURE COMFORT PEN NEEDLES) 31G X 8 MM MISC Use as directed twice daily with Novolog flex pen 02/10/19   Koberlein, Junell C, MD  levETIRAcetam (KEPPRA) 500 MG tablet Take 500 mg by mouth 2 (two) times daily.    [provider]  Lidocaine 3 % CREA Apply 1 application topically See admin instructions. Apply 1 times a day on Tuesday, Thursday and Saturday prior to dialysis    [provider]  Misc. Devices (COMMODE 3-IN-1) MISC Use as directed 02/24/20   [name], MD  Misc. Devices (TRANSFER BENCH) MISC Use as directed 02/24/20   Koberlein, [name], MD  Nutritional Supplements (FEEDING SUPPLEMENT, NEPRO CARB STEADY,) LIQD Take 237 mLs by mouth 3 (three) times daily between meals.    [provider]  ondansetron (ZOFRAN) 4 MG tablet Take 4 mg by mouth every 8 (eight) hours as needed for vomiting or nausea. 12/09/21   [provider]  oxyCODONE (OXY IR/ROXICODONE) 5 MG immediate release tablet Take 1 tablet (5 mg total) by mouth every 4 (four) hours  as needed for severe pain or breakthrough pain. 09/26/22   Dwyane Dee, MD  pantoprazole (PROTONIX) 40 MG tablet TAKE 1 TABLET BY MOUTH EVERY DAY Patient taking differently: Take 40 mg by mouth daily. 01/23/20   Koberlein, [name], MD  rosuvastatin (CRESTOR) 5 MG tablet Take 5 mg by mouth daily. 12/03/21   [provider]  tamsulosin (FLOMAX) 0.4 MG CAPS capsule TAKE 1 CAPSULE BY MOUTH EVERY DAY Patient taking differently: Take 0.4 mg by mouth daily. 03/28/20   [name], MD  triamcinolone cream (KENALOG) 0.1 % Apply 1 Application topically in the morning and at bedtime. Apply to back and arms for rash twice a day 08/07/22   [provider]  [place] X 5/16" 1 ML MISC Use pen needle with insulin 2 times daily 03/31/19    [name], MD                                                                                                                                    Past Surgical History Past Surgical History:  Procedure Laterality Date  . AV FISTULA PLACEMENT Left 04/25/2020   Procedure: LEFT ARM BRACHIOCEPHALIC ARTERIOVENOUS (AV) FISTULA CREATION;  Surgeon: Rosetta Posner, MD;  Location: [facility] OR;  Service: Vascular;  Laterality: Left;  . AV FISTULA PLACEMENT Left 02/28/2022   Procedure: LEFT ARM INSERTION OF ARTERIOVENOUS (AV) GORE-TEX GRAFT ARM;  Surgeon: Marty Heck, MD;  Location: [facility];  Service: Vascular;  Laterality: Left;  . IR FLUORO GUIDE CV LINE RIGHT  04/13/2020  . IR FLUORO GUIDE CV LINE RIGHT  01/31/2022  . IR REMOVAL TUN CV CATH W/O FL  01/14/2021  . IR US GUIDE VASC ACCESS RIGHT  04/13/2020  . IR US GUIDE VASC ACCESS RIGHT  01/31/2022  . IR US GUIDE VASC ACCESS RIGHT  01/31/2022  . KNEE ARTHROSCOPY Bilateral    Family History Family History  Problem Relation Age of Onset  . Other Mother   . Stroke Father   . Hypertension Father   . Epilepsy Father   . Breast cancer Sister   . Stroke Brother   . Heart attack Brother   . Diabetes Sister     Social History Social History   Tobacco Use  . Smoking status: Every Day    Packs/day: 0.50    Years: 35.00    Total pack years: 17.50    Types: Cigarettes    Passive exposure: Current  . Smokeless tobacco: Never  . Tobacco comments:    Currently smoking about 0.5ppd  Vaping Use  . Vaping Use: Never used  Substance Use Topics  . Alcohol use: No  . Drug use: Not Currently    Types: Marijuana   Allergies Oxycodone  Review of Systems Review of Systems  Unable to perform ROS: Mental status change  Musculoskeletal:  Positive for arthralgias.  Skin:  Positive for wound.  Physical Exam Vital Signs  I have reviewed the triage vital signs BP (!) 171/89 (BP Location: Right Arm)   Pulse 84   Temp 97.8 F (36.6 C)  (Axillary)   Resp 16   SpO2 99%  Physical Exam Vitals and nursing note reviewed. Exam conducted with a chaperone present.  Constitutional:      General: He is not in acute distress.    Appearance: He is well-developed.     Comments: Frail  HENT:     Head: Normocephalic and atraumatic. No raccoon eyes, Battle's sign, right periorbital erythema or left periorbital erythema.     Jaw: There is normal jaw occlusion.     Comments: No external signs of head trauma    Right Ear: External ear normal.     Left Ear: External ear normal.     Mouth/Throat:     Mouth: Mucous membranes are moist.  Eyes:     General: No scleral icterus.    Extraocular Movements: Extraocular movements intact.     Pupils: Pupils are equal, round, and reactive to light.  Cardiovascular:     Rate and Rhythm: Normal rate and regular rhythm.     Pulses: Normal pulses.     Heart sounds: Normal heart sounds.  Pulmonary:     Effort: Pulmonary effort is normal. No respiratory distress.     Breath sounds: Normal breath sounds.  Abdominal:     General: Abdomen is flat.     Palpations: Abdomen is soft.     Tenderness: There is no abdominal tenderness.  Musculoskeletal:        General: Normal range of motion.     Cervical back: Normal range of motion.     Right lower leg: No edema.     Left lower leg: No edema.       Legs:  Skin:    General: Skin is warm and dry.     Capillary Refill: Capillary refill takes less than 2 seconds.       Neurological:     Mental Status: He is alert. He is disoriented and confused.     GCS: GCS eye subscore is 4. GCS verbal subscore is 5. GCS motor subscore is 6.     Comments: He is following commands intermittently Prior CVA with right-sided residual deficit  Psychiatric:        Mood and Affect: Mood normal.        Behavior: Behavior normal.             ED Results and Treatments Labs (all labs ordered are listed, but only abnormal results are displayed) Labs Reviewed   COMPREHENSIVE METABOLIC PANEL - Abnormal; Notable for the following components:      Result Value   Potassium >7.5 (*)    Chloride 96 (*)    CO2 19 (*)    Glucose, Bld 170 (*)    BUN 172 (*)    Creatinine, Ser 22.57 (*)    Calcium 10.4 (*)    Albumin 2.7 (*)    GFR, Estimated 2 (*)    Anion gap 29 (*)    All other components within normal limits  LACTIC ACID, PLASMA - Abnormal; Notable for the following components:   Lactic Acid, Venous 2.1 (*)    All other components within normal limits  RESP PANEL BY RT-PCR (RSV, FLU A&B, COVID)  RVPGX2  LIPASE, BLOOD  AMMONIA  CBC WITH DIFFERENTIAL/PLATELET  URINALYSIS, ROUTINE W REFLEX MICROSCOPIC  BLOOD GAS, VENOUS  LACTIC ACID, PLASMA  C-REACTIVE PROTEIN  CBC WITH DIFFERENTIAL/PLATELET  SEDIMENTATION RATE                                                                                                                          Radiology DG Foot Complete Left  Result Date: 10/05/2022 CLINICAL DATA:  Wound EXAM: LEFT FOOT - COMPLETE 3 VIEW COMPARISON:  None Available. FINDINGS: Subtalar and talonavicular degenerative changes identified with sclerosis and osteophytes. No acute fracture, dislocation or subluxation identified. No osseous destructive process identified. No radiopaque foreign bodies are seen. IMPRESSION: Degenerative changes. No acute osseous abnormality and no bony destructive process identified. Electronically Signed   By: Sammie Bench M.D.   On: 10/16/2022 19:55   DG Chest 1 View  Result Date: 10/17/2022 CLINICAL DATA:  Wound infection screening EXAM: CHEST  1 VIEW COMPARISON:  09/30/2022 FINDINGS: Cardiac silhouette is unremarkable. No pneumothorax or pleural effusion. The lungs are clear. Aorta is calcified. The visualized skeletal structures are unremarkable. Hemodialysis catheter tip overlies RA/SVC. IMPRESSION: No acute cardiopulmonary process. Electronically Signed   By: Sammie Bench M.D.   On: 10/22/2022 19:51    CT Head Wo Contrast  Result Date: 09/24/2022 CLINICAL DATA:  Delirium EXAM: CT HEAD WITHOUT CONTRAST TECHNIQUE: Contiguous axial images were obtained from the base of the skull through the vertex without intravenous contrast. RADIATION DOSE REDUCTION: This exam was performed according to the departmental dose-optimization program which includes automated exposure control, adjustment of the mA and/or kV according to patient size and/or use of iterative reconstruction technique. COMPARISON:  CT brain 09/27/2022 FINDINGS: Brain: No acute territorial infarction, hemorrhage or intracranial mass. Moderate atrophy. Mild chronic small vessel ischemic changes of the white matter. Stable ventricle size. Vascular: No hyperdense vessels.  Carotid vascular calcification Skull: Normal. Negative for fracture or focal lesion. Sinuses/Orbits: No acute finding. Old appearing deformity medial wall left orbit. Other: None IMPRESSION: 1. No CT evidence for acute intracranial abnormality. 2. Atrophy and chronic small vessel ischemic changes of the white matter. Electronically Signed   By: Donavan Foil M.D.   On: 10/11/2022 19:42    Pertinent labs & imaging results that were available during my care of the patient were reviewed by me and considered in my medical decision making (see MDM for details).  Medications Ordered in ED Medications  sodium zirconium cyclosilicate (LOKELMA) packet 10 g (has no administration in time range)  calcium gluconate 1 g/ 50 mL sodium chloride IVPB (has no administration in time range)  insulin aspart (novoLOG) injection 5 Units (has no administration in time range)    And  dextrose 50 % solution 50 mL (has no administration in time range)  Procedures .Critical Care  Performed by: Jeanell Sparrow, DO Authorized by: Jeanell Sparrow, DO   Critical care  provider statement:    Critical care time (minutes):  30   Critical care time was exclusive of:  Separately billable procedures and treating other patients   Critical care was necessary to treat or prevent imminent or life-threatening deterioration of the following conditions:  Metabolic crisis   Critical care was time spent personally by me on the following activities:  Development of treatment plan with patient or surrogate, discussions with consultants, evaluation of patient's response to treatment, examination of patient, ordering and review of laboratory studies, ordering and review of radiographic studies, ordering and performing treatments and interventions, pulse oximetry, re-evaluation of patient's condition, review of old charts and obtaining history from patient or surrogate   (including critical care time)  Medical Decision Making / ED Course   MDM:  Giorgio Chabot is a 67 y.o. male with past medical history as below, significant for ESRD on HD T,Th,Sa,  HIV, bilateral nephropathy, prior CVA with residual right-sided deficits, DM2 who presents to the ED with complaint of AMS, foot wound.  Patient arrives nursing facility with concern for wound to his left foot, reduced cognitive status over the past 5 days. The complaint involves an extensive differential diagnosis and also carries with it a high risk of complications and morbidity.  Serious etiology was considered. Ddx includes but is not limited to: Differential diagnoses for altered mental status includes but is not exclusive to alcohol, illicit or prescription medications, intracranial pathology such as stroke, intracerebral hemorrhage, fever or infectious causes including sepsis, hypoxemia, uremia, trauma, endocrine related disorders such as diabetes, hypoglycemia, thyroid-related diseases, dry/wet gangrene, cellulitis, osteomyelitis, soft tissue infection etc.   On initial assessment the patient is: He is resting  comfortably, HDS, not hypoxic, following commands intermittently Vital signs and nursing notes were reviewed  Clinical Course as of 10/16/22 0004  Wed Oct 15, 2022  2036 Concern for gangrene to left foot, tissue necrosis noted on exam [SG]  2255 Spoke w/ Dr Lyla Glassing, recommends MRI wo, admit to medicine, will see in AM.  [SG]  2352 Spoke with nephro in regards to CMP, uremia/hyperK and elev Cr; will plan for HD emergently  [SG]  Thu Oct 16, 2022  0004 Nephro Dr Moshe Cipro [SG]    Clinical Course User Index [SG] Jeanell Sparrow, DO   Physical exam as above, labs are pending.  I have hide agrees patient for dry gangrene to his left foot.  Will get screening x-ray and screening labs.  Also get CT head given history of HIV, EMS, nursing home resident.  He is not candidate for TNK or thrombectomy given last known normal was over 5 days ago.   Pt with gangrene to left foot, see ed course as above; plan admit to hospitalist service  Unknown last HD but does have access to LUE  Delay in labs 2/2 poor access  CMP as above, place on cardiac monitor. D/w nephro, continue plan for admission  Pt is encephalopathic, likely 2/2 uremia, HD pending nephro eval  Pt signed out to incoming EDP pending labs and eventual admission. Ortho was consulted.    Additional history obtained: -Additional history obtained from na -External records from outside source obtained and reviewed including: Chart review including previous notes, labs, imaging, consultation notes including prior ED visits, prior missions, prior labs and imaging, primary care documentation Vascular surgery Dr. Carlis Abbott, left AV graft 6/23  Lab Tests: -I ordered, reviewed, and interpreted labs.   The pertinent results include:   Labs Reviewed  COMPREHENSIVE METABOLIC PANEL - Abnormal; Notable for the following components:      Result Value   Potassium >7.5 (*)    Chloride 96 (*)    CO2 19 (*)    Glucose, Bld 170 (*)    BUN 172 (*)     Creatinine, Ser 22.57 (*)    Calcium 10.4 (*)    Albumin 2.7 (*)    GFR, Estimated 2 (*)    Anion gap 29 (*)    All other components within normal limits  LACTIC ACID, PLASMA - Abnormal; Notable for the following components:   Lactic Acid, Venous 2.1 (*)    All other components within normal limits  RESP PANEL BY RT-PCR (RSV, FLU A&B, COVID)  RVPGX2  LIPASE, BLOOD  AMMONIA  CBC WITH DIFFERENTIAL/PLATELET  URINALYSIS, ROUTINE W REFLEX MICROSCOPIC  BLOOD GAS, VENOUS  LACTIC ACID, PLASMA  C-REACTIVE PROTEIN  CBC WITH DIFFERENTIAL/PLATELET  SEDIMENTATION RATE    Notable for hyperK, La elev, RVP neg, remainder of labs pending  EKG   EKG Interpretation  Date/Time:    Ventricular Rate:    PR Interval:    QRS Duration:   QT Interval:    QTC Calculation:   R Axis:     Text Interpretation:           Imaging Studies ordered: I ordered imaging studies including CT head, chest x-ray, left foot x-ray I independently visualized the following imaging with scope of interpretation limited to determining acute life threatening conditions related to emergency care: No acute process I independently visualized and interpreted imaging. I agree with the radiologist interpretation   Medicines ordered and prescription drug management: Meds ordered this encounter  Medications  . sodium zirconium cyclosilicate (LOKELMA) packet 10 g  . calcium gluconate 1 g/ 50 mL sodium chloride IVPB  . AND Linked Order Group   . insulin aspart (novoLOG) injection 5 Units   . dextrose 50 % solution 50 mL    -I have reviewed the patients home medicines and have made adjustments as needed   Consultations Obtained: I requested consultation with the ortho Dr Lyla Glassing,  and discussed lab and imaging findings as well as pertinent plan - they recommend: as above   Cardiac Monitoring: The patient was maintained on a cardiac monitor.  I personally viewed and interpreted the cardiac monitored which showed  an underlying rhythm of: NSR  Social Determinants of Health:  Diagnosis or treatment significantly limited by social determinants of health: current smoker   Reevaluation: After the interventions noted above, I reevaluated the patient and found that they have stayed the same  Co morbidities that complicate the patient evaluation . Past Medical History:  Diagnosis Date  . COVID-19 05/16/2020  . Diabetes mellitus, type II, insulin dependent ([place])   . ESRD on hemodialysis ([place]) 08/30/2021  . GERD (gastroesophageal reflux disease)   . Hematoma 03/19/2022  . HIV (human immunodeficiency virus infection) ([place])   . Hypertension   . Meningoencephalitis   . Neuropathy in diabetes ([place])    bilat feet  . Recurrent falls    possible small SAH right frontal lobe 12/19/21 (Plavix held x 1 week); left transverse foramen C3 fracture 11/04/21  . Ruptured lumbar disc   . Seizure disorder ([place])   . Stroke [facility]) 2013   residual right sided weakness (arm>leg)      Dispostion: Disposition decision including  need for hospitalization was considered, and patient dispo pending at shift change.     Final Clinical Impression(s) / ED Diagnoses Final diagnoses:  Gangrene ([place])  Uremia  Encephalopathy  Hyperkalemia  ESRD on dialysis [facility])     This chart was dictated using voice recognition software.  Despite best efforts to proofread,  errors can occur which can change the documentation meaning.    Jeanell Sparrow, DO 10/11/2022 2354    Jeanell Sparrow, DO 10/16/22 0003

## 2022-10-15 NOTE — ED Triage Notes (Signed)
Patient BIB EMS from [facility] to have left necrotic foot evaluated. Per EMS report patient has been having a mental decline since last Friday. Patient is normally able to wheel himself around in a wheelchair and is able to talk. Currently is nonverbal and not following commands. Grunts/moans with pain/movement. Supposed to get dialysis Tu,Th, and Sat. Unknown last dialysis. Hx of stroke with right sided deficits

## 2022-10-16 ENCOUNTER — Other Ambulatory Visit: Payer: Self-pay

## 2022-10-16 ENCOUNTER — Inpatient Hospital Stay (HOSPITAL_COMMUNITY): Payer: Medicare HMO

## 2022-10-16 DIAGNOSIS — N19 Unspecified kidney failure: Secondary | ICD-10-CM | POA: Diagnosis not present

## 2022-10-16 DIAGNOSIS — Z7189 Other specified counseling: Secondary | ICD-10-CM | POA: Diagnosis not present

## 2022-10-16 DIAGNOSIS — R4182 Altered mental status, unspecified: Secondary | ICD-10-CM | POA: Diagnosis not present

## 2022-10-16 DIAGNOSIS — Z21 Asymptomatic human immunodeficiency virus [HIV] infection status: Secondary | ICD-10-CM | POA: Diagnosis present

## 2022-10-16 DIAGNOSIS — I10 Essential (primary) hypertension: Secondary | ICD-10-CM | POA: Diagnosis not present

## 2022-10-16 DIAGNOSIS — E875 Hyperkalemia: Secondary | ICD-10-CM

## 2022-10-16 DIAGNOSIS — A419 Sepsis, unspecified organism: Secondary | ICD-10-CM | POA: Diagnosis present

## 2022-10-16 DIAGNOSIS — E1122 Type 2 diabetes mellitus with diabetic chronic kidney disease: Secondary | ICD-10-CM | POA: Diagnosis present

## 2022-10-16 DIAGNOSIS — B20 Human immunodeficiency virus [HIV] disease: Secondary | ICD-10-CM | POA: Diagnosis not present

## 2022-10-16 DIAGNOSIS — Z681 Body mass index (BMI) 19 or less, adult: Secondary | ICD-10-CM | POA: Diagnosis not present

## 2022-10-16 DIAGNOSIS — R0902 Hypoxemia: Secondary | ICD-10-CM | POA: Diagnosis not present

## 2022-10-16 DIAGNOSIS — I96 Gangrene, not elsewhere classified: Secondary | ICD-10-CM | POA: Diagnosis present

## 2022-10-16 DIAGNOSIS — I700 Atherosclerosis of aorta: Secondary | ICD-10-CM | POA: Diagnosis not present

## 2022-10-16 DIAGNOSIS — I120 Hypertensive chronic kidney disease with stage 5 chronic kidney disease or end stage renal disease: Secondary | ICD-10-CM | POA: Diagnosis present

## 2022-10-16 DIAGNOSIS — Z452 Encounter for adjustment and management of vascular access device: Secondary | ICD-10-CM | POA: Diagnosis not present

## 2022-10-16 DIAGNOSIS — N185 Chronic kidney disease, stage 5: Secondary | ICD-10-CM | POA: Diagnosis not present

## 2022-10-16 DIAGNOSIS — G40909 Epilepsy, unspecified, not intractable, without status epilepticus: Secondary | ICD-10-CM | POA: Diagnosis present

## 2022-10-16 DIAGNOSIS — R6520 Severe sepsis without septic shock: Secondary | ICD-10-CM | POA: Diagnosis not present

## 2022-10-16 DIAGNOSIS — Z794 Long term (current) use of insulin: Secondary | ICD-10-CM | POA: Diagnosis not present

## 2022-10-16 DIAGNOSIS — Z992 Dependence on renal dialysis: Secondary | ICD-10-CM | POA: Diagnosis not present

## 2022-10-16 DIAGNOSIS — Y835 Amputation of limb(s) as the cause of abnormal reaction of the patient, or of later complication, without mention of misadventure at the time of the procedure: Secondary | ICD-10-CM | POA: Diagnosis present

## 2022-10-16 DIAGNOSIS — E119 Type 2 diabetes mellitus without complications: Secondary | ICD-10-CM | POA: Diagnosis not present

## 2022-10-16 DIAGNOSIS — E8729 Other acidosis: Secondary | ICD-10-CM | POA: Diagnosis present

## 2022-10-16 DIAGNOSIS — G9349 Other encephalopathy: Secondary | ICD-10-CM | POA: Diagnosis present

## 2022-10-16 DIAGNOSIS — Z515 Encounter for palliative care: Secondary | ICD-10-CM | POA: Diagnosis not present

## 2022-10-16 DIAGNOSIS — E1152 Type 2 diabetes mellitus with diabetic peripheral angiopathy with gangrene: Secondary | ICD-10-CM | POA: Diagnosis present

## 2022-10-16 DIAGNOSIS — N400 Enlarged prostate without lower urinary tract symptoms: Secondary | ICD-10-CM | POA: Diagnosis not present

## 2022-10-16 DIAGNOSIS — E43 Unspecified severe protein-calorie malnutrition: Secondary | ICD-10-CM | POA: Diagnosis present

## 2022-10-16 DIAGNOSIS — Z1152 Encounter for screening for COVID-19: Secondary | ICD-10-CM | POA: Diagnosis not present

## 2022-10-16 DIAGNOSIS — E785 Hyperlipidemia, unspecified: Secondary | ICD-10-CM | POA: Diagnosis not present

## 2022-10-16 DIAGNOSIS — Z66 Do not resuscitate: Secondary | ICD-10-CM | POA: Diagnosis not present

## 2022-10-16 DIAGNOSIS — Z01818 Encounter for other preprocedural examination: Secondary | ICD-10-CM | POA: Diagnosis not present

## 2022-10-16 DIAGNOSIS — G9340 Encephalopathy, unspecified: Secondary | ICD-10-CM | POA: Diagnosis not present

## 2022-10-16 DIAGNOSIS — L89150 Pressure ulcer of sacral region, unstageable: Secondary | ICD-10-CM | POA: Diagnosis present

## 2022-10-16 DIAGNOSIS — E559 Vitamin D deficiency, unspecified: Secondary | ICD-10-CM | POA: Diagnosis not present

## 2022-10-16 DIAGNOSIS — T8744 Infection of amputation stump, left lower extremity: Secondary | ICD-10-CM | POA: Diagnosis present

## 2022-10-16 DIAGNOSIS — Z4682 Encounter for fitting and adjustment of non-vascular catheter: Secondary | ICD-10-CM | POA: Diagnosis not present

## 2022-10-16 DIAGNOSIS — N186 End stage renal disease: Secondary | ICD-10-CM | POA: Diagnosis not present

## 2022-10-16 DIAGNOSIS — D631 Anemia in chronic kidney disease: Secondary | ICD-10-CM | POA: Diagnosis present

## 2022-10-16 DIAGNOSIS — G9341 Metabolic encephalopathy: Secondary | ICD-10-CM | POA: Diagnosis present

## 2022-10-16 DIAGNOSIS — I169 Hypertensive crisis, unspecified: Secondary | ICD-10-CM | POA: Diagnosis not present

## 2022-10-16 DIAGNOSIS — Z8616 Personal history of COVID-19: Secondary | ICD-10-CM | POA: Diagnosis not present

## 2022-10-16 DIAGNOSIS — I69351 Hemiplegia and hemiparesis following cerebral infarction affecting right dominant side: Secondary | ICD-10-CM | POA: Diagnosis not present

## 2022-10-16 DIAGNOSIS — E1140 Type 2 diabetes mellitus with diabetic neuropathy, unspecified: Secondary | ICD-10-CM | POA: Diagnosis present

## 2022-10-16 LAB — CBC WITH DIFFERENTIAL/PLATELET
Abs Immature Granulocytes: 0.23 10*3/uL — ABNORMAL HIGH (ref 0.00–0.07)
Abs Immature Granulocytes: 0.17 10*3/uL — ABNORMAL HIGH (ref 0.00–0.07)
Basophils Absolute: 0 10*3/uL (ref 0.0–0.1)
Basophils Absolute: 0 10*3/uL (ref 0.0–0.1)
Basophils Relative: 0 %
Basophils Relative: 0 %
Eosinophils Absolute: 0 10*3/uL (ref 0.0–0.5)
Eosinophils Absolute: 0 10*3/uL (ref 0.0–0.5)
Eosinophils Relative: 0 %
Eosinophils Relative: 0 %
HCT: 37.1 % — ABNORMAL LOW (ref 39.0–52.0)
HCT: 35.5 % — ABNORMAL LOW (ref 39.0–52.0)
Hemoglobin: 12.1 g/dL — ABNORMAL LOW (ref 13.0–17.0)
Hemoglobin: 11.5 g/dL — ABNORMAL LOW (ref 13.0–17.0)
Immature Granulocytes: 1 %
Immature Granulocytes: 1 %
Lymphocytes Relative: 4 %
Lymphocytes Relative: 6 %
Lymphs Abs: 0.7 10*3/uL (ref 0.7–4.0)
Lymphs Abs: 0.9 10*3/uL (ref 0.7–4.0)
MCH: 30.3 pg (ref 26.0–34.0)
MCH: 30.3 pg (ref 26.0–34.0)
MCHC: 32.6 g/dL (ref 30.0–36.0)
MCHC: 32.4 g/dL (ref 30.0–36.0)
MCV: 93 fL (ref 80.0–100.0)
MCV: 93.4 fL (ref 80.0–100.0)
Monocytes Absolute: 0.8 10*3/uL (ref 0.1–1.0)
Monocytes Absolute: 1 10*3/uL (ref 0.1–1.0)
Monocytes Relative: 5 %
Monocytes Relative: 7 %
Neutro Abs: 14.3 10*3/uL — ABNORMAL HIGH (ref 1.7–7.7)
Neutro Abs: 13.1 10*3/uL — ABNORMAL HIGH (ref 1.7–7.7)
Neutrophils Relative %: 90 %
Neutrophils Relative %: 86 %
Platelets: 234 10*3/uL (ref 150–400)
Platelets: 225 10*3/uL (ref 150–400)
RBC: 3.99 MIL/uL — ABNORMAL LOW (ref 4.22–5.81)
RBC: 3.8 MIL/uL — ABNORMAL LOW (ref 4.22–5.81)
RDW: 16.6 % — ABNORMAL HIGH (ref 11.5–15.5)
RDW: 16.7 % — ABNORMAL HIGH (ref 11.5–15.5)
WBC: 16.1 10*3/uL — ABNORMAL HIGH (ref 4.0–10.5)
WBC: 15.2 10*3/uL — ABNORMAL HIGH (ref 4.0–10.5)
nRBC: 0 % (ref 0.0–0.2)
nRBC: 0 % (ref 0.0–0.2)

## 2022-10-16 LAB — COMPREHENSIVE METABOLIC PANEL
ALT: 15 U/L (ref 0–44)
AST: 23 U/L (ref 15–41)
Albumin: 2.2 g/dL — ABNORMAL LOW (ref 3.5–5.0)
Alkaline Phosphatase: 92 U/L (ref 38–126)
Anion gap: 26 — ABNORMAL HIGH (ref 5–15)
BUN: 91 mg/dL — ABNORMAL HIGH (ref 8–23)
CO2: 15 mmol/L — ABNORMAL LOW (ref 22–32)
Calcium: 8.9 mg/dL (ref 8.9–10.3)
Chloride: 96 mmol/L — ABNORMAL LOW (ref 98–111)
Creatinine, Ser: 12.68 mg/dL — ABNORMAL HIGH (ref 0.61–1.24)
GFR, Estimated: 4 mL/min — ABNORMAL LOW (ref >60)
Glucose, Bld: 133 mg/dL — ABNORMAL HIGH (ref 70–99)
Potassium: 5.3 mmol/L — ABNORMAL HIGH (ref 3.5–5.1)
Sodium: 137 mmol/L (ref 135–145)
Total Bilirubin: 1.3 mg/dL — ABNORMAL HIGH (ref 0.3–1.2)
Total Protein: 6.9 g/dL (ref 6.5–8.1)

## 2022-10-16 LAB — LACTIC ACID, PLASMA
Lactic Acid, Venous: 1.3 mmol/L (ref 0.5–1.9)
Lactic Acid, Venous: 1.4 mmol/L (ref 0.5–1.9)

## 2022-10-16 LAB — CBG MONITORING, ED: Glucose-Capillary: 88 mg/dL (ref 70–99)

## 2022-10-16 LAB — GLUCOSE, CAPILLARY
Glucose-Capillary: 92 mg/dL (ref 70–99)
Glucose-Capillary: 138 mg/dL — ABNORMAL HIGH (ref 70–99)
Glucose-Capillary: 142 mg/dL — ABNORMAL HIGH (ref 70–99)

## 2022-10-16 LAB — PHOSPHORUS: Phosphorus: 7.4 mg/dL — ABNORMAL HIGH (ref 2.5–4.6)

## 2022-10-16 LAB — HEPATITIS B SURFACE ANTIGEN: Hepatitis B Surface Ag: NONREACTIVE

## 2022-10-16 LAB — C-REACTIVE PROTEIN: CRP: 32.7 mg/dL — ABNORMAL HIGH (ref <1.0)

## 2022-10-16 LAB — MRSA NEXT GEN BY PCR, NASAL: MRSA by PCR Next Gen: NOT DETECTED

## 2022-10-16 LAB — BETA-HYDROXYBUTYRIC ACID: Beta-Hydroxybutyric Acid: 1.77 mmol/L — ABNORMAL HIGH (ref 0.05–0.27)

## 2022-10-16 MED ORDER — INSULIN GLARGINE-YFGN 100 UNIT/ML ~~LOC~~ SOPN: 5.0000 [IU] | PEN_INJECTOR | Freq: Every day | SUBCUTANEOUS | Status: DC

## 2022-10-16 MED ORDER — CHLORHEXIDINE GLUCONATE CLOTH 2 % EX PADS
6.0000 | MEDICATED_PAD | Freq: Every day | CUTANEOUS | Status: DC
  Administered 2022-10-16 – 2022-10-18 (×2): 6 via TOPICAL

## 2022-10-16 MED ORDER — BICTEGRAVIR-EMTRICITAB-TENOFOV 50-200-25 MG PO TABS
1.0000 | ORAL_TABLET | Freq: Every day | ORAL | Status: DC
  Filled 2022-10-16 (×2): qty 1

## 2022-10-16 MED ORDER — DOCUSATE SODIUM 100 MG PO CAPS: 100.0000 mg | ORAL_CAPSULE | Freq: Two times a day (BID) | ORAL | Status: DC | PRN

## 2022-10-16 MED ORDER — INSULIN ASPART 100 UNIT/ML IJ SOLN
0.0000 [IU] | INTRAMUSCULAR | Status: DC
  Administered 2022-10-16: 1 [IU] via SUBCUTANEOUS
  Administered 2022-10-16 – 2022-10-17 (×4): 2 [IU] via SUBCUTANEOUS
  Administered 2022-10-18: 5 [IU] via SUBCUTANEOUS
  Administered 2022-10-18: 2 [IU] via SUBCUTANEOUS

## 2022-10-16 MED ORDER — CHLORHEXIDINE GLUCONATE CLOTH 2 % EX PADS: 6.0000 | MEDICATED_PAD | Freq: Every day | CUTANEOUS | Status: DC

## 2022-10-16 MED ORDER — HEPARIN SODIUM (PORCINE) 5000 UNIT/ML IJ SOLN
5000.0000 [IU] | Freq: Three times a day (TID) | INTRAMUSCULAR | Status: DC
  Administered 2022-10-16 – 2022-10-18 (×5): 5000 [IU] via SUBCUTANEOUS
  Filled 2022-10-16 (×5): qty 1

## 2022-10-16 MED ORDER — PIPERACILLIN-TAZOBACTAM 3.375 G IVPB 30 MIN: 3.3750 g | Freq: Once | INTRAVENOUS | Status: DC

## 2022-10-16 MED ORDER — DEXTROSE IN LACTATED RINGERS 5 % IV SOLN: INTRAVENOUS | Status: DC

## 2022-10-16 MED ORDER — POLYETHYLENE GLYCOL 3350 17 G PO PACK: 17.0000 g | PACK | Freq: Every day | ORAL | Status: DC | PRN

## 2022-10-16 MED ORDER — VANCOMYCIN HCL 750 MG/150ML IV SOLN
750.0000 mg | INTRAVENOUS | Status: DC
  Administered 2022-10-18: 750 mg via INTRAVENOUS
  Filled 2022-10-16: qty 150

## 2022-10-16 MED ORDER — ONDANSETRON HCL 4 MG/2ML IJ SOLN: 4.0000 mg | Freq: Four times a day (QID) | INTRAMUSCULAR | Status: DC | PRN

## 2022-10-16 MED ORDER — CLOPIDOGREL BISULFATE 75 MG PO TABS: 75.0000 mg | ORAL_TABLET | Freq: Every day | ORAL | Status: DC

## 2022-10-16 MED ORDER — INSULIN GLARGINE-YFGN 100 UNIT/ML ~~LOC~~ SOLN
5.0000 [IU] | Freq: Every day | SUBCUTANEOUS | Status: DC
  Administered 2022-10-16 – 2022-10-17 (×2): 5 [IU] via SUBCUTANEOUS
  Filled 2022-10-16 (×3): qty 0.05

## 2022-10-16 MED ORDER — HEPARIN SODIUM (PORCINE) 1000 UNIT/ML DIALYSIS
20.0000 [IU]/kg | INTRAMUSCULAR | Status: DC | PRN
  Administered 2022-10-16: 1350 [IU] via INTRAVENOUS_CENTRAL

## 2022-10-16 MED ORDER — LEVETIRACETAM IN NACL 500 MG/100ML IV SOLN
500.0000 mg | Freq: Two times a day (BID) | INTRAVENOUS | Status: DC
  Administered 2022-10-16 – 2022-10-18 (×4): 500 mg via INTRAVENOUS
  Filled 2022-10-16 (×4): qty 100

## 2022-10-16 MED ORDER — SODIUM CHLORIDE 0.9 % IV SOLN
1.0000 g | INTRAVENOUS | Status: DC
  Administered 2022-10-16: 1 g via INTRAVENOUS
  Filled 2022-10-16 (×2): qty 10

## 2022-10-16 MED ORDER — ORAL CARE MOUTH RINSE
15.0000 mL | OROMUCOSAL | Status: DC | PRN
  Administered 2022-10-17 – 2022-10-18 (×2): 15 mL via OROMUCOSAL

## 2022-10-16 MED ORDER — VANCOMYCIN HCL 1500 MG/300ML IV SOLN
1500.0000 mg | Freq: Once | INTRAVENOUS | Status: AC
  Administered 2022-10-16: 1500 mg via INTRAVENOUS
  Filled 2022-10-16: qty 300

## 2022-10-16 MED ORDER — HEPARIN SODIUM (PORCINE) 1000 UNIT/ML DIALYSIS
1000.0000 [IU] | INTRAMUSCULAR | Status: DC | PRN
  Administered 2022-10-16: 3800 [IU]
  Filled 2022-10-16: qty 1

## 2022-10-16 NOTE — Progress Notes (Signed)
Pre Hemodialysis   10/16/22 0110  Vitals  Temp (!) 97.2 F (36.2 C)  Pulse Rate 98 (ECG pulse, unable to get acurate pulse on pulse ox)  Resp 12  BP (!) 145/87  SpO2 100 %  O2 Device Room Air  Weight  (UNABLE TO OBTAIN PT ON STRETCHER)  Oxygen Therapy  Patient Activity (if Appropriate) In bed  Pulse Oximetry Type Continuous  Oximetry Probe Site Changed Yes  Pre Treatment  Is pt a NEW START this admission?  No  Vascular access used during treatment Catheter  HD catheter dressing before treatment Not dated;No Biopatch  Patient is receiving dialysis in a chair No  Hemodialysis Consent Verified Yes (Emergent Dialysis, pt verbal consent, unable to sign -hemiplegia/contractures)  Hemodialysis Standing Orders Initiated Yes  ECG (Telemetry) Monitor On Yes  Prime Ordered Normal Saline  Length of  DialysisTreatment -hour(s) 3 Hour(s)  Dialysis mode HD  Dialysis Treatment Comments Pt started on emergent dialysis for hyperkalemia, VS stable for tx.  Dialyzer Revaclear 300  Dialysate 1K  Dialysis Anticoagulation Heparin bolus  Dialysate Flow Ordered 200  Blood Flow Rate Ordered 250 mL/min  Ultrafiltration Goal 3000 Liters  Dialysis Blood Pressure Support Ordered Normal Saline

## 2022-10-16 NOTE — ED Provider Notes (Signed)
  Physical Exam  BP 98/64   Pulse (!) 25   Temp 97.6 F (36.4 C)   Resp 17   SpO2 (!) 27%   Physical Exam Vitals and nursing note reviewed.  Constitutional:      General: He is in acute distress.     Appearance: He is ill-appearing.     Comments: obtunded  HENT:     Mouth/Throat:     Mouth: Mucous membranes are dry.  Eyes:     Conjunctiva/sclera: Conjunctivae normal.  Cardiovascular:     Rate and Rhythm: Regular rhythm. Tachycardia present.  Pulmonary:     Effort: Pulmonary effort is normal. No respiratory distress.  Abdominal:     General: Abdomen is flat.     Palpations: Abdomen is soft.  Musculoskeletal:     Comments: Obvious left foot infection  Skin:    General: Skin is warm and dry.     Capillary Refill: Capillary refill takes less than 2 seconds.  Neurological:     Comments: Obtunded, not following commands, opens eyes to voice     Procedures  Procedures  ED Course / MDM   Clinical Course as of 10/16/22 0932  Wed Oct 15, 2022  2036 Concern for gangrene to left foot, tissue necrosis noted on exam [SG]  2255 Spoke w/ Dr Lyla Glassing, recommends MRI wo, admit to medicine, will see in AM.  [SG]  2352 Spoke with nephro in regards to CMP, uremia/hyperK and elev Cr; will plan for HD emergently  [SG]  Thu Oct 16, 2022  0004 Nephro Dr Moshe Cipro [SG]  0730 Received sign out from Dr. Dina Rich pending ICU consult. Patient with hyperkalemia s/p dialysis pending repeat labs. Has obvious left foot infection [WS]  0909 ICU team at bedside. Plan to admit to ICU. Repeat potassium significantly improved.  [WS]  0930 Patient admitted to ICU. [WS]    Clinical Course User Index [SG] Jeanell Sparrow, DO [WS] Cristie Hem, MD   Medical Decision Making Amount and/or Complexity of Data Reviewed Labs: ordered. Radiology: ordered.  Risk Prescription drug management. Decision regarding hospitalization.    Problem List Items Addressed This Visit       Other    Hyperkalemia   Other Visit Diagnoses     Gangrene ([place])    -  Primary   Uremia       Encephalopathy       ESRD on dialysis [facility])                Cristie Hem, MD 10/16/22 [phone]

## 2022-10-16 NOTE — Progress Notes (Signed)
Pharmacy Antibiotic Note  Richard [name] is a 67 y.o. male admitted on 10/17/2022 with  septic shock due to gangreous L foot infection . Pt has ESRD and receives HD, but frequency is unclear as well as last HD session from SNF report - last reported HD session was 10/07/22.  Pharmacy has been consulted for Cefepime and Vancomycin dosing.  WBC 15.2, afebrile LA 2.1 > 1.3  Plan: Initiate Cefepime 1g IV q24h to be given after HD on dialysis days Initiate loading dose of Vancomycin 1500mg  IV x 1, followed by  Vancomycin 750mg  IV qHD T/T/S    > Goal vancomycin trough level 15-20    > Check vancomycin pre-HD levels at steady state  Monitor daily CBC, temp, and for clinical signs of improvement  F/u HD plans - received 3hr emergent HD overnight early 1/25 AM F/u cultures and de-escalate antibiotics as able   Height: 6' (182.9 cm) Weight: 65.8 kg (145 lb) IBW/kg (Calculated) : 77.6  Temp (24hrs), Avg:97.7 F (36.5 C), Min:97.2 F (36.2 C), Max:98.1 F (36.7 C)  Recent Labs  Lab 09/30/2022 2107 10/16/22 0600 10/16/22 0701 10/16/22 0705  WBC  --  16.1* 15.2*  --   CREATININE 22.57*  --  12.68*  --   LATICACIDVEN 2.1*  --   --  1.3    Estimated Creatinine Clearance: 5.4 mL/min (A) (by C-G formula based on SCr of 12.68 mg/dL (H)).    Allergies  Allergen Reactions   Oxycodone Nausea And Vomiting    NOT DOCUMENTED ON MAR    Antimicrobials this admission: Cefepime 1/25 >>  Vancomycin 1/25 >>   Dose adjustments this admission: N/A  Microbiology results: 1/25 BCx: pending  Thank you for allowing pharmacy to be a part of this patient's care.  Luisa Hart, PharmD, BCPS Clinical Pharmacist 10/16/2022 10:02 AM   Please refer to AMION for pharmacy phone number

## 2022-10-16 NOTE — Progress Notes (Signed)
Was called about this patient brought in from SNF for confusion and foot wound.  It appears that possibly his last HD was 1/16.  His K is 7.5 and BUN was 172.  Has received short term treatment for K.  Have talked to HD nurse-  machines cannot be used til 0100 and the water needs to be turned off at 0500.  Will plan for 3 hour treatment  in this window-  will need lower BFR/DFR to avoid dialysis dysequilibruim but also a 1 K bath.  Will likely need treatment on 1/26 as well   Louis Meckel

## 2022-10-16 NOTE — ED Notes (Signed)
Responsive to pain only. Unable to capture oxygen saturation with pulse oximetry. R.T called to do ABG. All other vitals WDL. Critical care provider at bedside.

## 2022-10-16 NOTE — Progress Notes (Signed)
I discussed the patient's case with critical care.  We will see how his mental status improves with antibiotics and supportive care and also treating his uremia.  Will have to see if he is agreeable to left above-knee amputation after his mental status improves.  Again he is obtunded at this time with no family at bedside and no family contact in the chart.  Marty Heck, MD [facility] Office: [place]

## 2022-10-16 NOTE — Consult Note (Signed)
Renal Service Consult Note [facility]  Richard [name] 10/16/2022 Richard Blazing, MD Requesting Physician: Dr Silas Flood  Reason for Consult: ESRD pt w/ necrotic foot wound HPI: The patient is a 67 y.o. year-old w/ PMH as below who presented to ED from SNF for necrotic L foot w/ AMS. In ED BUN 172, creat 22. AG 29. Last OP HD was 1/16. K+ was 7.2. Pt was admitted and got HD early this am. We are asked to see for ESRD.    Pt seen in ED room, he is not answering questions, staring off.    ROS - n/a   Past Medical History  Past Medical History:  Diagnosis Date   COVID-19 05/16/2020   Diabetes mellitus, type II, insulin dependent ([place])    ESRD on hemodialysis ([place]) 08/30/2021   GERD (gastroesophageal reflux disease)    Hematoma 03/19/2022   HIV (human immunodeficiency virus infection) ([place])    Hypertension    Meningoencephalitis    Neuropathy in diabetes ([place])    bilat feet   Recurrent falls    possible small SAH right frontal lobe 12/19/21 (Plavix held x 1 week); left transverse foramen C3 fracture 11/04/21   Ruptured lumbar disc    Seizure disorder ([place])    Stroke ([place]) 2013   residual right sided weakness (arm>leg)   Past Surgical History  Past Surgical History:  Procedure Laterality Date   AV FISTULA PLACEMENT Left 04/25/2020   Procedure: LEFT ARM BRACHIOCEPHALIC ARTERIOVENOUS (AV) FISTULA CREATION;  Surgeon: Rosetta Posner, MD;  Location: [facility] OR;  Service: Vascular;  Laterality: Left;   AV FISTULA PLACEMENT Left 02/28/2022   Procedure: LEFT ARM INSERTION OF ARTERIOVENOUS (AV) GORE-TEX GRAFT ARM;  Surgeon: Marty Heck, MD;  Location: [facility] OR;  Service: Vascular;  Laterality: Left;   IR FLUORO GUIDE CV LINE RIGHT  04/13/2020   IR FLUORO GUIDE CV LINE RIGHT  01/31/2022   IR REMOVAL TUN CV CATH W/O FL  01/14/2021   IR US GUIDE VASC ACCESS RIGHT  04/13/2020   IR US GUIDE VASC ACCESS RIGHT  01/31/2022   IR US GUIDE VASC ACCESS RIGHT  01/31/2022   KNEE  ARTHROSCOPY Bilateral    Family History  Family History  Problem Relation Age of Onset   Other Mother    Stroke Father    Hypertension Father    Epilepsy Father    Breast cancer Sister    Stroke Brother    Heart attack Brother    Diabetes Sister    Social History  reports that he has been smoking cigarettes. He has a 17.50 pack-year smoking history. He has been exposed to tobacco smoke. He has never used smokeless tobacco. He reports that he does not currently use drugs after having used the following drugs: Marijuana. He reports that he does not drink alcohol. Allergies  Allergies  Allergen Reactions   Oxycodone Nausea And Vomiting    NOT DOCUMENTED ON MAR   Home medications Prior to Admission medications   Medication Sig Start Date End Date Taking? Authorizing Provider  acetaminophen (TYLENOL) 325 MG tablet Take 650 mg by mouth every 6 (six) hours as needed for moderate pain, fever or mild pain.   Yes [provider]  b complex-vitamin c-folic acid (NEPHRO-VITE) 0.8 MG TABS tablet Take 1 tablet by mouth daily.   Yes [provider]  BACTRIM DS 800-160 MG tablet Take 1 tablet by mouth every Monday, Wednesday, and Friday. 05/15/20  Yes [provider]  bictegravir-emtricitabine-tenofovir AF (BIKTARVY) 50-200-25 MG TABS tablet Take 1 tablet by mouth daily. 09/27/22  Yes Dwyane Dee, MD  calcium acetate (PHOSLO) 667 MG capsule Take 2,001 mg by mouth 3 (three) times daily with meals.   Yes [provider]  carvedilol (COREG) 6.25 MG tablet Take 1 tablet (6.25 mg total) by mouth 2 (two) times daily with a meal. 09/26/22  Yes Dwyane Dee, MD  clopidogrel (PLAVIX) 75 MG tablet Take 1 tablet (75 mg total) by mouth daily. 03/07/20  Yes Koberlein, Junell C, MD  docusate sodium (COLACE) 100 MG capsule Take 100 mg by mouth daily.   Yes [provider]  doxepin (SINEQUAN) 75 MG capsule Take 75 mg by mouth at bedtime.   Yes [provider]   epoetin alfa-epbx (RETACRIT) 34742 UNIT/ML injection Inject 20,000 Units into the skin every Friday.   Yes [provider]  Ergocalciferol 50 MCG (2000 UT) TABS Take 2,000 Units by mouth daily.   Yes [provider]  hydrALAZINE (APRESOLINE) 10 MG tablet Take 10 mg by mouth 3 (three) times daily.   Yes [provider]  hydrocortisone cream 1 % Apply 1 Application topically every 8 (eight) hours as needed for itching. Apply to affected areas on back arms   Yes [provider]  insulin glargine-yfgn (SEMGLEE) 100 UNIT/ML Pen Inject 5 Units into the skin at bedtime.   Yes [provider]  levETIRAcetam (KEPPRA) 500 MG tablet Take 500 mg by mouth 2 (two) times daily.   Yes [provider]  Lidocaine 3 % CREA Apply 1 application topically See admin instructions. Apply 1 times a day on Tuesday, Thursday and Saturday prior to dialysis   Yes [provider]  ondansetron (ZOFRAN) 4 MG tablet Take 4 mg by mouth every 8 (eight) hours as needed for vomiting or nausea. 12/09/21  Yes [provider]  ondansetron (ZOFRAN) 4 MG tablet Take 4 mg by mouth in the morning.   Yes [provider]  oxyCODONE (OXY IR/ROXICODONE) 5 MG immediate release tablet Take 1 tablet (5 mg total) by mouth every 4 (four) hours as needed for severe pain or breakthrough pain. 09/26/22  Yes Dwyane Dee, MD  pantoprazole (PROTONIX) 40 MG tablet TAKE 1 TABLET BY MOUTH EVERY DAY Patient taking differently: Take 40 mg by mouth daily. 01/23/20  Yes Koberlein, Junell C, MD  rosuvastatin (CRESTOR) 5 MG tablet Take 5 mg by mouth daily. 12/03/21  Yes [provider]  tamsulosin (FLOMAX) 0.4 MG CAPS capsule TAKE 1 CAPSULE BY MOUTH EVERY DAY Patient taking differently: Take 0.4 mg by mouth daily. 03/28/20  Yes Koberlein, [name], MD  traMADol (ULTRAM) 50 MG tablet Take 50 mg by mouth every 8 (eight) hours as needed for moderate pain or severe pain. 10/08/22  Yes  [provider]  triamcinolone cream (KENALOG) 0.1 % Apply 1 Application topically in the morning and at bedtime. Apply to back and arms for rash twice a day 08/07/22  Yes [provider]  [place] test strip USE 1 strip TO test blood sugar 2 TIMES DAILY TO 3 TIMES DAILY 06/10/19   [name], MD  blood glucose meter kit and supplies KIT Dispense based on patient and insurance preference. Check blood sugar 4 times a day 06/11/18   Koberlein, [name], MD  Insulin Pen Needle (SURE COMFORT PEN NEEDLES) 31G X 8 MM MISC Use as directed twice daily with Novolog flex pen 02/10/19   Koberlein, Junell C,  MD  Misc. Devices (COMMODE 3-IN-1) MISC Use as directed 02/24/20   [name], MD  Misc. Devices (TRANSFER BENCH) MISC Use as directed 02/24/20   Koberlein, [name], MD  Nutritional Supplements (FEEDING SUPPLEMENT, NEPRO CARB STEADY,) LIQD Take 237 mLs by mouth 3 (three) times daily between meals.    [provider]  TRUEPLUS INSULIN SYRINGE 31G X 5/16" 1 ML MISC Use pen needle with insulin 2 times daily 03/31/19   [name] C, MD     Vitals:   10/16/22 1330 10/16/22 1400 10/16/22 1504 10/16/22 1600  BP: (!) 90/53 101/62  136/65  Pulse: 97 92  94  Resp: 13 11  12   Temp: 98 F (36.7 C)  97.9 F (36.6 C)   TempSrc: Axillary  Axillary   SpO2: 100% 97%  100%  Weight:      Height:       Exam Gen awake but staring off, does not engage, Egan O2 No rash, cyanosis or gangrene Sclera anicteric, throat clear  No jvd or bruits Chest clear bilat to bases, no rales/ wheezing RRR no MRG Abd soft ntnd no mass or ascites +bs GU normal male MS no joint effusions or deformity Ext no LE or UE edema, large black necrotic area of the L foot Neuro as above    RIJ TDC intact     Home meds include - biktarvy, coreg 6.25 bid, plavix, colace, doxepin, hydralazine 10 tid, insulin glargine-yfgn, keppra, nepro, oxy IR, protonix, crestor, flomax, prns/ vits/  supps    OP HD: [place] TTS 4h  400/800  70kg  2/2 bath  TDC RIJ   Heparin 1600  - needs updating   Assessment/ Plan: L necrotic foot infection - per pmd is getting IV abx Hyperkalemia - K+ 7.2 in ED. Had HD this am, f/u K+ 5.3. Will give lokelma x 2 tonight.  ESRD - on HD TTS. Last OP HD was 1/16, missed several sessions. Will plan next HD tomorrow off schedule.  HTN/ volume - BP's soft, no need for BP lowering meds. No vol ^ on exam. Well below dry wt, keep even w/ next HD.  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 here, get records MBD ckd - CCa in range, will add on phos, get records in am HIV  DM2 on insulin      Rob Jorgeluis Gurganus  MD 10/16/2022, 4:03 PM Recent Labs  Lab 10/16/2022 2107 10/16/22 0600 10/16/22 0701  HGB  --  12.1* 11.5*  ALBUMIN 2.7*  --  2.2*  CALCIUM 10.4*  --  8.9  CREATININE 22.57*  --  12.68*  K >7.5*  --  5.3*   Inpatient medications:  bictegravir-emtricitabine-tenofovir AF  1 tablet Oral Daily   Chlorhexidine Gluconate Cloth  6 each Topical Q0600   [START ON 10/17/2022] Chlorhexidine Gluconate Cloth  6 each Topical Q0600   heparin  5,000 Units Subcutaneous Q8H   insulin aspart  0-9 Units Subcutaneous Q4H   insulin glargine-yfgn  5 Units Subcutaneous QHS   sodium zirconium cyclosilicate  10 g Oral Once    calcium gluconate     ceFEPime (MAXIPIME) IV Stopped (10/16/22 1310)   dextrose 5% lactated ringers 100 mL/hr at 10/16/22 1400   levETIRAcetam 400 mL/hr at 10/16/22 1400   [START ON 10/18/2022] vancomycin     docusate sodium, ondansetron (ZOFRAN) IV, mouth rinse, polyethylene glycol

## 2022-10-16 NOTE — Procedures (Signed)
Central Venous Catheter Insertion Procedure Note Ioannis Schuh [id number] 1955/11/26  Procedure: Insertion of Central Venous Catheter Indications: Assessment of intravascular volume, Drug and/or fluid administration, and Frequent blood sampling  Procedure Details Consent: Unable to obtain consent because of emergent medical necessity. Time Out: Verified patient identification, verified procedure, site/side was marked, verified correct patient position, special equipment/implants available, medications/allergies/relevent history reviewed, required imaging and test results available.  Performed  Maximum sterile technique was used including antiseptics, cap, gloves, gown, hand hygiene, mask, and sheet. Skin prep: Chlorhexidine; local anesthetic administered A antimicrobial bonded/coated triple lumen catheter was placed in the left internal jugular vein using the Seldinger technique. Ultrasound guidance used.Yes.   Catheter placed to 20 cm. Blood aspirated via all 3 ports and then flushed x 3. Line sutured x 2 and dressing applied.  Evaluation Blood flow good Complications: No apparent complications Patient did tolerate procedure well. Chest X-ray ordered to verify placement.  CXR: pending.  Richardson Landry Renold Kozar ACNP Acute Care Nurse Practitioner [place] Please consult Amion 10/16/2022, 9:17 AM

## 2022-10-16 NOTE — Consult Note (Addendum)
Reason for Consult:Left foot gangrene Referring Physician: Larey Days Time called: 3557 Time at bedside: [name] is an 67 y.o. male.  HPI: Richard [name] was brought to the ED from the SNF where he resides for a necrotic foot. He is obtunded and cannot contribute to history. I do not know what his baseline is.  Past Medical History:  Diagnosis Date   COVID-19 05/16/2020   Diabetes mellitus, type II, insulin dependent ([place])    ESRD on hemodialysis ([place]) 08/30/2021   GERD (gastroesophageal reflux disease)    Hematoma 03/19/2022   HIV (human immunodeficiency virus infection) ([place])    Hypertension    Meningoencephalitis    Neuropathy in diabetes ([place])    bilat feet   Recurrent falls    possible small SAH right frontal lobe 12/19/21 (Plavix held x 1 week); left transverse foramen C3 fracture 11/04/21   Ruptured lumbar disc    Seizure disorder ([place])    Stroke ([place]) 2013   residual right sided weakness (arm>leg)    Past Surgical History:  Procedure Laterality Date   AV FISTULA PLACEMENT Left 04/25/2020   Procedure: LEFT ARM BRACHIOCEPHALIC ARTERIOVENOUS (AV) FISTULA CREATION;  Surgeon: Rosetta Posner, MD;  Location: [facility] OR;  Service: Vascular;  Laterality: Left;   AV FISTULA PLACEMENT Left 02/28/2022   Procedure: LEFT ARM INSERTION OF ARTERIOVENOUS (AV) GORE-TEX GRAFT ARM;  Surgeon: Marty Heck, MD;  Location: [facility] OR;  Service: Vascular;  Laterality: Left;   IR FLUORO GUIDE CV LINE RIGHT  04/13/2020   IR FLUORO GUIDE CV LINE RIGHT  01/31/2022   IR REMOVAL TUN CV CATH W/O FL  01/14/2021   IR US GUIDE VASC ACCESS RIGHT  04/13/2020   IR US GUIDE VASC ACCESS RIGHT  01/31/2022   IR US GUIDE VASC ACCESS RIGHT  01/31/2022   KNEE ARTHROSCOPY Bilateral     Family History  Problem Relation Age of Onset   Other Mother    Stroke Father    Hypertension Father    Epilepsy Father    Breast cancer Sister    Stroke Brother    Heart attack Brother    Diabetes Sister      Social History:  reports that he has been smoking cigarettes. He has a 17.50 pack-year smoking history. He has been exposed to tobacco smoke. He has never used smokeless tobacco. He reports that he does not currently use drugs after having used the following drugs: Marijuana. He reports that he does not drink alcohol.  Allergies:  Allergies  Allergen Reactions   Oxycodone Nausea And Vomiting    NOT DOCUMENTED ON MAR    Medications: I have reviewed the patient's current medications.  Results for orders placed or performed during the hospital encounter of 10/13/2022 (from the past 48 hour(s))  Resp panel by RT-PCR (RSV, Flu A&B, Covid) Anterior Nasal Swab     Status: None   Collection Time: 10/17/2022  6:53 PM   Specimen: Anterior Nasal Swab  Result Value Ref Range   SARS Coronavirus 2 by RT PCR NEGATIVE NEGATIVE    Comment: (NOTE) SARS-CoV-2 target nucleic acids are NOT DETECTED.  The SARS-CoV-2 RNA is generally detectable in upper respiratory specimens during the acute phase of infection. The lowest concentration of SARS-CoV-2 viral copies this assay can detect is 138 copies/mL. A negative result does not preclude SARS-Cov-2 infection and should not be used as the sole basis for treatment or other patient management decisions. A negative result may occur  with  improper specimen collection/handling, submission of specimen other than nasopharyngeal swab, presence of viral mutation(s) within the areas targeted by this assay, and inadequate number of viral copies(<138 copies/mL). A negative result must be combined with clinical observations, patient history, and epidemiological information. The expected result is Negative.  Fact Sheet for Patients:  EntrepreneurPulse.com.au  Fact Sheet for Healthcare Providers:  IncredibleEmployment.be  This test is no t yet approved or cleared by the [place] FDA and  has been authorized for detection  and/or diagnosis of SARS-CoV-2 by FDA under an Emergency Use Authorization (EUA). This EUA will remain  in effect (meaning this test can be used) for the duration of the COVID-19 declaration under Section 564(b)(1) of the Act, 21 U.S.C.section 360bbb-3(b)(1), unless the authorization is terminated  or revoked sooner.       Influenza A by PCR NEGATIVE NEGATIVE   Influenza B by PCR NEGATIVE NEGATIVE    Comment: (NOTE) The Xpert Xpress SARS-CoV-2/FLU/RSV plus assay is intended as an aid in the diagnosis of influenza from Nasopharyngeal swab specimens and should not be used as a sole basis for treatment. Nasal washings and aspirates are unacceptable for Xpert Xpress SARS-CoV-2/FLU/RSV testing.  Fact Sheet for Patients: EntrepreneurPulse.com.au  Fact Sheet for Healthcare Providers: IncredibleEmployment.be  This test is not yet approved or cleared by the [place] FDA and has been authorized for detection and/or diagnosis of SARS-CoV-2 by FDA under an Emergency Use Authorization (EUA). This EUA will remain in effect (meaning this test can be used) for the duration of the COVID-19 declaration under Section 564(b)(1) of the Act, 21 U.S.C. section 360bbb-3(b)(1), unless the authorization is terminated or revoked.     Resp Syncytial Virus by PCR NEGATIVE NEGATIVE    Comment: (NOTE) Fact Sheet for Patients: EntrepreneurPulse.com.au  Fact Sheet for Healthcare Providers: IncredibleEmployment.be  This test is not yet approved or cleared by the [place] FDA and has been authorized for detection and/or diagnosis of SARS-CoV-2 by FDA under an Emergency Use Authorization (EUA). This EUA will remain in effect (meaning this test can be used) for the duration of the COVID-19 declaration under Section 564(b)(1) of the Act, 21 U.S.C. section 360bbb-3(b)(1), unless the authorization is terminated  or revoked.  Performed at [facility], [place] [address], [place], [place] [address]   Comprehensive metabolic panel     Status: Abnormal   Collection Time: 09/28/2022  9:07 PM  Result Value Ref Range   Sodium 144 135 - 145 mmol/L   Potassium >7.5 (HH) 3.5 - 5.1 mmol/L    Comment: CRITICAL RESULT CALLED TO, READ BACK BY AND VERIFIED WITH A.TOWSLEY,RN. [id number] 10/10/2022. LPAIT   Chloride 96 (L) 98 - 111 mmol/L   CO2 19 (L) 22 - 32 mmol/L   Glucose, Bld 170 (H) 70 - 99 mg/dL    Comment: Glucose reference range applies only to samples taken after fasting for at least 8 hours.   BUN 172 (H) 8 - 23 mg/dL   Creatinine, Ser 22.57 (H) 0.61 - 1.24 mg/dL   Calcium 10.4 (H) 8.9 - 10.3 mg/dL   Total Protein 7.9 6.5 - 8.1 g/dL   Albumin 2.7 (L) 3.5 - 5.0 g/dL   AST 23 15 - 41 U/L   ALT 14 0 - 44 U/L   Alkaline Phosphatase 119 38 - 126 U/L   Total Bilirubin 0.7 0.3 - 1.2 mg/dL   GFR, Estimated 2 (L) >60 mL/min    Comment: (NOTE) Calculated using the CKD-EPI Creatinine  Equation (2021)    Anion gap 29 (H) 5 - 15    Comment: ELECTROLYTES REPEATED TO VERIFY Performed at [facility], [place] [address], [place], [place] [address]   Lipase, blood     Status: None   Collection Time: 10/01/2022  9:07 PM  Result Value Ref Range   Lipase 42 11 - 51 U/L    Comment: Performed at [facility] [address], [place], [place] [address]  Lactic acid, plasma     Status: Abnormal   Collection Time: 10/22/2022  9:07 PM  Result Value Ref Range   Lactic Acid, Venous 2.1 (HH) 0.5 - 1.9 mmol/L    Comment: CRITICAL RESULT CALLED TO, READ BACK BY AND VERIFIED WITH B.OLDLAND,RN. 2304 10/08/2022. LPAIT Performed at [facility], [place] [address], [place], [place] [address]   Ammonia     Status: None   Collection Time: 10/01/2022  9:07 PM  Result Value Ref Range   Ammonia 35 9 - 35 umol/L    Comment: Performed at [facility], [place] [address], [place], [place] [address]  CBG monitoring, ED      Status: None   Collection Time: 10/16/22 12:06 AM  Result Value Ref Range   Glucose-Capillary 88 70 - 99 mg/dL    Comment: Glucose reference range applies only to samples taken after fasting for at least 8 hours.  CBC with Differential     Status: Abnormal   Collection Time: 10/16/22  6:00 AM  Result Value Ref Range   WBC 16.1 (H) 4.0 - 10.5 K/uL   RBC 3.99 (L) 4.22 - 5.81 MIL/uL   Hemoglobin 12.1 (L) 13.0 - 17.0 g/dL   HCT 37.1 (L) 39.0 - 52.0 %   MCV 93.0 80.0 - 100.0 fL   MCH 30.3 26.0 - 34.0 pg   MCHC 32.6 30.0 - 36.0 g/dL   RDW 16.6 (H) 11.5 - 15.5 %   Platelets 234 150 - 400 K/uL   nRBC 0.0 0.0 - 0.2 %   Neutrophils Relative % 90 %   Neutro Abs 14.3 (H) 1.7 - 7.7 K/uL   Lymphocytes Relative 4 %   Lymphs Abs 0.7 0.7 - 4.0 K/uL   Monocytes Relative 5 %   Monocytes Absolute 0.8 0.1 - 1.0 K/uL   Eosinophils Relative 0 %   Eosinophils Absolute 0.0 0.0 - 0.5 K/uL   Basophils Relative 0 %   Basophils Absolute 0.0 0.0 - 0.1 K/uL   Immature Granulocytes 1 %   Abs Immature Granulocytes 0.23 (H) 0.00 - 0.07 K/uL    Comment: Performed at [facility], [address], [place], [place] [address]  CBC with Differential     Status: Abnormal   Collection Time: 10/16/22  7:01 AM  Result Value Ref Range   WBC 15.2 (H) 4.0 - 10.5 K/uL   RBC 3.80 (L) 4.22 - 5.81 MIL/uL   Hemoglobin 11.5 (L) 13.0 - 17.0 g/dL   HCT 35.5 (L) 39.0 - 52.0 %   MCV 93.4 80.0 - 100.0 fL   MCH 30.3 26.0 - 34.0 pg   MCHC 32.4 30.0 - 36.0 g/dL   RDW 16.7 (H) 11.5 - 15.5 %   Platelets 225 150 - 400 K/uL   nRBC 0.0 0.0 - 0.2 %   Neutrophils Relative % 86 %   Neutro Abs 13.1 (H) 1.7 - 7.7 K/uL   Lymphocytes Relative 6 %   Lymphs Abs 0.9 0.7 -  4.0 K/uL   Monocytes Relative 7 %   Monocytes Absolute 1.0 0.1 - 1.0 K/uL   Eosinophils Relative 0 %   Eosinophils Absolute 0.0 0.0 - 0.5 K/uL   Basophils Relative 0 %   Basophils Absolute 0.0 0.0 - 0.1 K/uL   Immature Granulocytes 1 %   Abs Immature Granulocytes  0.17 (H) 0.00 - 0.07 K/uL    Comment: Performed at [facility] [address] [place], [place] [address]  Comprehensive metabolic panel     Status: Abnormal   Collection Time: 10/16/22  7:01 AM  Result Value Ref Range   Sodium 137 135 - 145 mmol/L   Potassium 5.3 (H) 3.5 - 5.1 mmol/L   Chloride 96 (L) 98 - 111 mmol/L   CO2 15 (L) 22 - 32 mmol/L   Glucose, Bld 133 (H) 70 - 99 mg/dL    Comment: Glucose reference range applies only to samples taken after fasting for at least 8 hours.   BUN 91 (H) 8 - 23 mg/dL   Creatinine, Ser 12.68 (H) 0.61 - 1.24 mg/dL   Calcium 8.9 8.9 - 10.3 mg/dL   Total Protein 6.9 6.5 - 8.1 g/dL   Albumin 2.2 (L) 3.5 - 5.0 g/dL   AST 23 15 - 41 U/L   ALT 15 0 - 44 U/L   Alkaline Phosphatase 92 38 - 126 U/L   Total Bilirubin 1.3 (H) 0.3 - 1.2 mg/dL   GFR, Estimated 4 (L) >60 mL/min    Comment: (NOTE) Calculated using the CKD-EPI Creatinine Equation (2021)    Anion gap 26 (H) 5 - 15    Comment: ELECTROLYTES REPEATED TO VERIFY Performed at [facility], [place] [address], [place], [place] [address]   Lactic acid, plasma     Status: None   Collection Time: 10/16/22  7:05 AM  Result Value Ref Range   Lactic Acid, Venous 1.3 0.5 - 1.9 mmol/L    Comment: Performed at [facility] [address], [place], [place] [address]    DG Foot Complete Left  Result Date: 10/17/2022 CLINICAL DATA:  Wound EXAM: LEFT FOOT - COMPLETE 3 VIEW COMPARISON:  None Available. FINDINGS: Subtalar and talonavicular degenerative changes identified with sclerosis and osteophytes. No acute fracture, dislocation or subluxation identified. No osseous destructive process identified. No radiopaque foreign bodies are seen. IMPRESSION: Degenerative changes. No acute osseous abnormality and no bony destructive process identified. Electronically Signed   By: Sammie Bench M.D.   On: 10/13/2022 19:55   DG Chest 1 View  Result Date: 10/06/2022 CLINICAL DATA:  Wound infection  screening EXAM: CHEST  1 VIEW COMPARISON:  09/30/2022 FINDINGS: Cardiac silhouette is unremarkable. No pneumothorax or pleural effusion. The lungs are clear. Aorta is calcified. The visualized skeletal structures are unremarkable. Hemodialysis catheter tip overlies RA/SVC. IMPRESSION: No acute cardiopulmonary process. Electronically Signed   By: Sammie Bench M.D.   On: 10/08/2022 19:51   CT Head Wo Contrast  Result Date: 10/12/2022 CLINICAL DATA:  Delirium EXAM: CT HEAD WITHOUT CONTRAST TECHNIQUE: Contiguous axial images were obtained from the base of the skull through the vertex without intravenous contrast. RADIATION DOSE REDUCTION: This exam was performed according to the departmental dose-optimization program which includes automated exposure control, adjustment of the mA and/or kV according to patient size and/or use of iterative reconstruction technique. COMPARISON:  CT brain 09/27/2022 FINDINGS: Brain: No acute territorial infarction, hemorrhage or intracranial mass. Moderate atrophy. Mild chronic small vessel ischemic changes of  the white matter. Stable ventricle size. Vascular: No hyperdense vessels.  Carotid vascular calcification Skull: Normal. Negative for fracture or focal lesion. Sinuses/Orbits: No acute finding. Old appearing deformity medial wall left orbit. Other: None IMPRESSION: 1. No CT evidence for acute intracranial abnormality. 2. Atrophy and chronic small vessel ischemic changes of the white matter. Electronically Signed   By: Donavan Foil M.D.   On: 10/11/2022 19:42    Review of Systems  Unable to perform ROS: Mental status change   Blood pressure 98/64, pulse (!) 25, temperature 97.6 F (36.4 C), resp. rate 17, SpO2 (!) 27 %. Physical Exam Constitutional:      General: He is not in acute distress.    Appearance: He is well-developed. He is not diaphoretic.  HENT:     Head: Normocephalic and atraumatic.  Eyes:     General: No scleral icterus.       Right eye: No  discharge.        Left eye: No discharge.     Conjunctiva/sclera: Conjunctivae normal.  Cardiovascular:     Rate and Rhythm: Normal rate and regular rhythm.  Pulmonary:     Effort: Pulmonary effort is normal. No respiratory distress.  Musculoskeletal:     Cervical back: Normal range of motion.  Feet:     Comments: Left foot: Gangrene entire forefoot sparing only medial aspect great toe. Malodorous. 0 DP/PT. Could not assess motor/sensation 2/2 obtundation. Appeared to have TTP medial hindfoot. Skin:    General: Skin is warm and dry.  Psychiatric:        Mood and Affect: Mood normal.        Behavior: Behavior normal.     Assessment/Plan: Left foot gangrene -- Will get ABI; MRI pending. However, even if vascularly intact and no osteo still likely needs amputation given degree of disease.     Lisette Abu, PA-C Orthopedic Surgery [phone] 10/16/2022, 9:46 AM   Attending Addendum:  Reviewed clinical situation and imaging at length. Patient has necrotic left foot secondary to ischemic gangrene. He will require urgent amputation. Due to known peripheral arterial disease and extensive comorbidities, Vascular Surgery team was consulted who evaluated him and advised above knee amputation. However, patient remains obtunded during admission and family decided to pursue palliative care without any surgical intervention at this time. Ortho will sign off.  Armond Hang, MD Orthopaedic Surgery [facility]

## 2022-10-16 NOTE — Plan of Care (Signed)

## 2022-10-16 NOTE — ED Notes (Signed)
Dr. Dina Rich obtained blood for 1 blue top blood culture, gray top, lavender, and light green.

## 2022-10-16 NOTE — ED Notes (Signed)
R.T unable to obtain ABG. Provider at bedside to perform central line insertion.

## 2022-10-16 NOTE — Consult Note (Addendum)
[facility] NOTE   MRN : [id number]  Reason for Consult: left foot gangrene  Referring Physician: ED  History of Present Illness: 67 y/o was brought to the ED with mental status changes.  He resides at a facility and is not verbally communicating, no family present.  The information was taken from his chart.  We have been consult to exam and make recommendation for left foot non healing wound with ischemic skin changes.   Past medical history includes: HIV, DM, ESRD, HTN, and history of left brain stroke with right hemiparesis.      Current Facility-Administered Medications  Medication Dose Route Frequency Provider Last Rate Last Admin   calcium gluconate 1 g/ 50 mL sodium chloride IVPB  1 g Intravenous Once Horton, Barbette Hair, MD       ceFEPIme (MAXIPIME) 1 g in sodium chloride 0.9 % 100 mL IVPB  1 g Intravenous Q24H Jardin, Carla G, RPH       Chlorhexidine Gluconate Cloth 2 % PADS 6 each  6 each Topical Q0600 Corliss Parish, MD       sodium zirconium cyclosilicate (LOKELMA) packet 10 g  10 g Oral Once Wynona Dove A, DO       vancomycin (VANCOREADY) IVPB 1500 mg/300 mL  1,500 mg Intravenous Once Jardin, Carla G, RPH       [START ON 10/18/2022] vancomycin (VANCOREADY) IVPB 750 mg/150 mL  750 mg Intravenous Q T,Th,Sa-HD Jardin, Carla G, RPH       Current Outpatient Medications  Medication Sig Dispense Refill   ACCU-CHEK AVIVA PLUS test strip USE 1 strip TO test blood sugar 2 TIMES DAILY TO 3 TIMES DAILY 100 strip 1   acetaminophen (TYLENOL) 325 MG tablet Take 650 mg by mouth every 6 (six) hours as needed for moderate pain, fever or mild pain.     b complex-vitamin c-folic acid (NEPHRO-VITE) 0.8 MG TABS tablet Take 1 tablet by mouth daily.     bictegravir-emtricitabine-tenofovir AF (BIKTARVY) 50-200-25 MG TABS tablet Take 1 tablet by mouth daily. 30 tablet    blood glucose meter kit and supplies KIT Dispense based on patient and insurance  preference. Check blood sugar 4 times a day 1 each 0   calcium acetate (PHOSLO) 667 MG capsule Take 2,001 mg by mouth 3 (three) times daily with meals.     carvedilol (COREG) 6.25 MG tablet Take 1 tablet (6.25 mg total) by mouth 2 (two) times daily with a meal.     clopidogrel (PLAVIX) 75 MG tablet Take 1 tablet (75 mg total) by mouth daily. 90 tablet 0   docusate sodium (COLACE) 100 MG capsule Take 100 mg by mouth daily.     doxepin (SINEQUAN) 75 MG capsule Take 75 mg by mouth at bedtime.     epoetin alfa-epbx (RETACRIT) 85027 UNIT/ML injection Inject 20,000 Units into the skin every Monday.     Ergocalciferol 50 MCG (2000 UT) TABS Take 2,000 Units by mouth daily.     hydrALAZINE (APRESOLINE) 10 MG tablet Take 10 mg by mouth 3 (three) times daily.     hydrocortisone cream 1 % Apply 1 Application topically every 6 (six) hours as needed for itching. Apply to affected areas on back arms     insulin glargine-yfgn (SEMGLEE) 100 UNIT/ML Pen Inject 5 Units into the skin at bedtime.     Insulin Pen Needle (SURE COMFORT PEN NEEDLES) 31G X 8 MM MISC Use as directed twice daily with Novolog flex  pen 100 each 3   levETIRAcetam (KEPPRA) 500 MG tablet Take 500 mg by mouth 2 (two) times daily.     Lidocaine 3 % CREA Apply 1 application topically See admin instructions. Apply 1 times a day on Tuesday, Thursday and Saturday prior to dialysis     Misc. Devices (COMMODE 3-IN-1) MISC Use as directed 1 each 0   Misc. Devices (TRANSFER BENCH) MISC Use as directed 1 each 0   Nutritional Supplements (FEEDING SUPPLEMENT, NEPRO CARB STEADY,) LIQD Take 237 mLs by mouth 3 (three) times daily between meals.     ondansetron (ZOFRAN) 4 MG tablet Take 4 mg by mouth every 8 (eight) hours as needed for vomiting or nausea.     oxyCODONE (OXY IR/ROXICODONE) 5 MG immediate release tablet Take 1 tablet (5 mg total) by mouth every 4 (four) hours as needed for severe pain or breakthrough pain. 30 tablet 0   pantoprazole (PROTONIX) 40  MG tablet TAKE 1 TABLET BY MOUTH EVERY DAY (Patient taking differently: Take 40 mg by mouth daily.) 90 tablet 0   rosuvastatin (CRESTOR) 5 MG tablet Take 5 mg by mouth daily.     tamsulosin (FLOMAX) 0.4 MG CAPS capsule TAKE 1 CAPSULE BY MOUTH EVERY DAY (Patient taking differently: Take 0.4 mg by mouth daily.) 30 capsule 0   triamcinolone cream (KENALOG) 0.1 % Apply 1 Application topically in the morning and at bedtime. Apply to back and arms for rash twice a day     TRUEPLUS INSULIN SYRINGE 31G X 5/16" 1 ML MISC Use pen needle with insulin 2 times daily 100 each 1    Pt meds include: Statin :Yes Betablocker: No ASA: No Other anticoagulants/antiplatelets: none  Past Medical History:  Diagnosis Date   COVID-19 05/16/2020   Diabetes mellitus, type II, insulin dependent ([place])    ESRD on hemodialysis ([place]) 08/30/2021   GERD (gastroesophageal reflux disease)    Hematoma 03/19/2022   HIV (human immunodeficiency virus infection) ([place])    Hypertension    Meningoencephalitis    Neuropathy in diabetes ([place])    bilat feet   Recurrent falls    possible small SAH right frontal lobe 12/19/21 (Plavix held x 1 week); left transverse foramen C3 fracture 11/04/21   Ruptured lumbar disc    Seizure disorder ([place])    Stroke ([place]) 2013   residual right sided weakness (arm>leg)    Past Surgical History:  Procedure Laterality Date   AV FISTULA PLACEMENT Left 04/25/2020   Procedure: LEFT ARM BRACHIOCEPHALIC ARTERIOVENOUS (AV) FISTULA CREATION;  Surgeon: Rosetta Posner, MD;  Location: [facility] OR;  Service: Vascular;  Laterality: Left;   AV FISTULA PLACEMENT Left 02/28/2022   Procedure: LEFT ARM INSERTION OF ARTERIOVENOUS (AV) GORE-TEX GRAFT ARM;  Surgeon: Marty Heck, MD;  Location: [facility] OR;  Service: Vascular;  Laterality: Left;   IR FLUORO GUIDE CV LINE RIGHT  04/13/2020   IR FLUORO GUIDE CV LINE RIGHT  01/31/2022   IR REMOVAL TUN CV CATH W/O FL  01/14/2021   IR US GUIDE VASC ACCESS RIGHT  04/13/2020   IR US  GUIDE VASC ACCESS RIGHT  01/31/2022   IR US GUIDE VASC ACCESS RIGHT  01/31/2022   KNEE ARTHROSCOPY Bilateral     Social History Social History   Tobacco Use   Smoking status: Every Day    Packs/day: 0.50    Years: 35.00    Total pack years: 17.50    Types: Cigarettes    Passive exposure: Current  Smokeless tobacco: Never   Tobacco comments:    Currently smoking about 0.5ppd  Vaping Use   Vaping Use: Never used  Substance Use Topics   Alcohol use: No   Drug use: Not Currently    Types: Marijuana    Family History Family History  Problem Relation Age of Onset   Other Mother    Stroke Father    Hypertension Father    Epilepsy Father    Breast cancer Sister    Stroke Brother    Heart attack Brother    Diabetes Sister     Allergies  Allergen Reactions   Oxycodone Nausea And Vomiting    NOT DOCUMENTED ON MAR     REVIEW OF SYSTEMS  General: [ ]  Weight loss, [ ]  Fever, [ ]  chills Neurologic: [ ]  Dizziness, [ ]  Blackouts, [ ]  Seizure [ ]  Stroke, [ ]  "Mini stroke", [ ]  Slurred speech, [ ]  Temporary blindness; [ ]  weakness in arms or legs, [ ]  Hoarseness [ ]  Dysphagia Cardiac: [ ]  Chest pain/pressure, [ ]  Shortness of breath at rest [ ]  Shortness of breath with exertion, [ ]  Atrial fibrillation or irregular heartbeat  Vascular: [ ]  Pain in legs with walking, [ ]  Pain in legs at rest, [ ]  Pain in legs at night,  [ ]  Non-healing ulcer, [ ]  Blood clot in vein/DVT,   Pulmonary: [ ]  Home oxygen, [ ]  Productive cough, [ ]  Coughing up blood, [ ]  Asthma,  [ ]  Wheezing [ ]  COPD Musculoskeletal:  [ ]  Arthritis, [ ]  Low back pain, [ ]  Joint pain Hematologic: [ ]  Easy Bruising, [ ]  Anemia; [ ]  Hepatitis Gastrointestinal: [ ]  Blood in stool, [ ]  Gastroesophageal Reflux/heartburn, Urinary: [ ]  chronic Kidney disease, [ ]  on HD - [ ]  MWF or [ ]  TTHS, [ ]  Burning with urination, [ ]  Difficulty urinating Skin: [ ]  Rashes, [ ]  Wounds Psychological: [ ]  Anxiety, [ ]   Depression  Physical Examination Vitals:   10/16/22 0650 10/16/22 0845 10/16/22 0948 10/16/22 1012  BP: 98/64   (!) 93/57  Pulse: (!) 105 (!) 25  (!) 102  Resp: 16 17  14   Temp:      TempSrc:      SpO2: 99% (!) 27%    Weight:   65.8 kg   Height:   6' (1.829 m)    Body mass index is 19.67 kg/m.  General:   NAD  Eyes: Pupils equal Pulmonary: normal non-labored breathing Cardiac: RRR, without  Murmurs, rubs or gallops; No carotid bruits Abdomen: soft, NT, no masses Skin: no rashes, ulcers noted;  positive Gangrene , no cellulitis; no open wounds;      Vascular Exam/Pulses:Left popliteal doppler, Right PT signals, palpable femoral pulses   Musculoskeletal: positive muscle wasting or atrophy; no edema  Neurologic: not verbally responsive SENSATION: unknown MOTOR FUNCTION: not accessed Speech non verbal   Significant Diagnostic Studies: CBC Lab Results  Component Value Date   WBC 15.2 (H) 10/16/2022   HGB 11.5 (L) 10/16/2022   HCT 35.5 (L) 10/16/2022   MCV 93.4 10/16/2022   PLT 225 10/16/2022    BMET    Component Value Date/Time   NA 137 10/16/2022 0701   K 5.3 (H) 10/16/2022 0701   CL 96 (L) 10/16/2022 0701   CO2 15 (L) 10/16/2022 0701   GLUCOSE 133 (H) 10/16/2022 0701   BUN 91 (H) 10/16/2022 0701   CREATININE 12.68 (H) 10/16/2022 0701   CALCIUM 8.9  10/16/2022 0701   CALCIUM 7.7 (L) 01/14/2021 1253   GFRNONAA 4 (L) 10/16/2022 0701   GFRAA 7 (L) 06/02/2020 1696   Estimated Creatinine Clearance: 5.4 mL/min (A) (by C-G formula based on SCr of 12.68 mg/dL (H)).  COAG Lab Results  Component Value Date   INR 1.1 07/23/2022   INR 1.1 12/19/2021   INR 1.1 04/13/2020     Non-Invasive Vascular Imaging:  Foot x ray  IMPRESSION: Degenerative changes. No acute osseous abnormality and no bony destructive process identified.   ASSESSMENT/PLAN:  History of right hemispheres s/p stroke He is obtunded and does not communicate at all on exam  today Presented with left foot Ischemic skin changes without tibial doppler signals The skin changes move past the arch of the foot. The foot is not salvable.  He does have a popliteal doppler signal.    ABI's have been ordered by Ortho as well as MRI.   Roxy Horseman 10/16/2022 10:35 AM   I have seen and evaluated the patient. I agree with the PA note as documented above.  67 year old male with history of end-stage renal disease, HIV, diabetes, hypertension, previous stroke with residual right-sided weakness that vascular surgery has been consulted for left foot infection.  He has extensive necrotic changes to the distal aspect of the left foot including almost the entire extent of the plantar surface.  I do not feel the foot is salvageable.  I have offered him a left above-knee amputation.  He does have a palpable left femoral pulse.  Patient is obtunded in the ED and I cannot have any conversation with him at this time.  There is no family at bedside.  There is no family numbers in the chart.  I will have to reach out to medicine on next steps.  Marty Heck, MD [facility] Office: [phone]

## 2022-10-16 NOTE — H&P (Addendum)
NAME:  Richard [name], MRN:  [id number], DOB:  1956-04-20, LOS: 0 ADMISSION DATE:  10/22/2022, CONSULTATION DATE:  10/16/22\ REFERRING MD:  EDP, CHIEF COMPLAINT:  AMS   History of Present Illness:  67 year old man presented from skilled nursing facility with necrotic foot with likely metabolic encephalopathy due to severe sepsis and lack of dialysis with significant uremia.  Patient is nearly obtunded.  Eyes are open.  Protecting airway and will wince with pain.  Initial labs with a potassium of 7.5.  BUN 172 with a creatinine of 22.5.  His anion gap is 29.  He has received no antibiotics.  At time of evaluation his only IV access is his HD catheter.  Unknown time of last dialysis.  Nephrology note indicates last HD 10/07/2021.  I attempted to contact [facility] skilled nursing facility multiple times.  It went straight to messaging system.  I tried several times to engage the operator as indicated in the message but never was able to talk to a human.  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 rate 16, SpO2 99 %.        Intake/Output Summary (Last 24 hours) at 10/16/2022 0813 Last data filed at 10/16/2022 0510 Gross per 24 hour  Intake --  Output -1790 ml  Net 1790 ml   Filed Weights    Examination: General: Chronically ill-appearing man lying in stretcher HENT: Teeth are very dirty, dry mucous membranes Lungs: Clear, normal work of breathing Cardiovascular: Tachycardic, no lower extremity edema Abdomen: Nondistended, bowel sounds present Extremities: Contractures right  lower and right upper extremity Neuro: Does not follow commands, moans with stimulation, left-sided neck preference with stiff neck, limited mobility of neck  Resolved Hospital Problem list     Assessment & Plan:  Altered mental status in setting of metabolic disarray from end-stage renal disease complicated by gangrenous left foot and encephalopathy due to severe sepsis.  10/08/2022 CT of the head shows no acute abnormalities. Admit to the intensive care unit Monitor neurological status Correct metabolic imbalance if able Antibiotics  Hypoxia in the setting of disarray DM somnolent. end-stage renal disease, history of stroke with right-sided weakness and inability to ventilate. Admit to the intensive care unit Chest x-ray clear  Severe sepsis, septic shock with elevated lactic acidosis due to gangrenous foot: --Vancomycin and cefepime once IV access established -- Lactic acid is trended down  Hypovolemia:  --Gentle fluids once IV access established  Hyperkalemia: -- Suspect due to not receiving HD lately, he was a resident of a skilled nursing facility do not understand how he has not received dialysis -- Improved with short session of HD  Uremia: -- Suspect due to not receiving HD lately, he was a resident of a skilled nursing facility do not understand how he has not received dialysis -- Improved with short session of HD  Leukocytosis: Suspect combination of dehydration, volume contraction, sepsis. -- Continue to monitor  Anion gap metabolic acidosis: Out of proportion to lactic acidosis.  Suspect starvation ketosis and uremia contributing. -- Consider dextrose containing fluids -- Beta hydroxybutyrate  Hypochloremia: Likely due to volume contraction.  Anemia: Stable.  Likely related to ESRD, anemia of chronic illness.  Sacral decubitus ulcer: Present on admission -- Consider wound care consultation  DM: --home 5u long acting, q4 SSI  Seizure history: --IV keprra  500 mg BID (oral at home)  Best Practice (right click and "Reselect all SmartList Selections" daily)   Diet/type: NPO DVT prophylaxis: not indicated GI prophylaxis: PPI Lines: Central line Foley:  Yes, and it is still needed Code Status:  full code Last date of multidisciplinary goals of care discussion [tbd]  Labs   CBC: Recent Labs  Lab 10/16/22 0600 10/16/22 0701  WBC 16.1* 15.2*  NEUTROABS 14.3* 13.1*  HGB 12.1* 11.5*  HCT 37.1* 35.5*  MCV 93.0 93.4  PLT 234 536    Basic Metabolic Panel: Recent Labs  Lab 09/22/2022 2107  NA 144  K >7.5*  CL 96*  CO2 19*  GLUCOSE 170*  BUN 172*  CREATININE 22.57*  CALCIUM 10.4*   GFR: CrCl cannot be calculated (Unknown ideal weight.). Recent Labs  Lab 10/11/2022 2107 10/16/22 0600 10/16/22 0701 10/16/22 0705  WBC  --  16.1* 15.2*  --   LATICACIDVEN 2.1*  --   --  1.3    Liver Function Tests: Recent Labs  Lab 10/14/2022 2107  AST 23  ALT 14  ALKPHOS 119  BILITOT 0.7  PROT 7.9  ALBUMIN 2.7*   Recent Labs  Lab 10/08/2022 2107  LIPASE 42   Recent Labs  Lab 09/23/2022 2107  AMMONIA 35    ABG    Component Value Date/Time   PHART 7.346 (L) 04/12/2020 1856   PCO2ART 23.7 (L) 04/12/2020 1856   PO2ART 105 04/12/2020 1856   HCO3 17.7 (L) 08/31/2022 2353   TCO2 19 (L) 08/31/2022 2353   ACIDBASEDEF 6.0 (H) 08/31/2022 2353   O2SAT 99 08/31/2022 2353     Coagulation Profile: No results for input(s): "INR", "PROTIME" in the last 168 hours.  Cardiac Enzymes: No results for input(s): "CKTOTAL", "CKMB", "CKMBINDEX", "TROPONINI" in the last 168 hours.  HbA1C: Hgb A1c MFr Bld  Date/Time Value Ref Range Status  09/19/2022 10:44 PM 6.5 (H) 4.8 - 5.6 % Final    Comment:    (NOTE) Pre diabetes:          5.7%-6.4%  Diabetes:              >6.4%  Glycemic control for   <7.0% adults with diabetes   10/21/2021 05:44 AM 5.2 4.8 - 5.6 % Final    Comment:    (NOTE) Pre diabetes:          5.7%-6.4%  Diabetes:               >6.4%  Glycemic control for   <7.0% adults with diabetes     CBG: Recent Labs  Lab 10/16/22 0006  GLUCAP 88    Review of Systems:   Unable to obtain due to encephalopathy  Past Medical History:  He,  has a past medical history of COVID-19 (05/16/2020), Diabetes mellitus, type II, insulin dependent ([place]), ESRD on hemodialysis ([place]) (08/30/2021), GERD (gastroesophageal reflux disease), Hematoma (03/19/2022), HIV (human immunodeficiency virus infection) ([place]), Hypertension, Meningoencephalitis, Neuropathy in diabetes ([place]), Recurrent falls, Ruptured lumbar disc, Seizure disorder ([place]), and Stroke ([place]) (2013).   Surgical History:   Past Surgical History:  Procedure Laterality Date   AV FISTULA PLACEMENT Left 04/25/2020   Procedure: LEFT ARM BRACHIOCEPHALIC ARTERIOVENOUS (AV) FISTULA CREATION;  Surgeon: Rosetta Posner, MD;  Location: [facility];  Service: Vascular;  Laterality: Left;   AV FISTULA PLACEMENT Left 02/28/2022   Procedure: LEFT ARM INSERTION OF ARTERIOVENOUS (AV) GORE-TEX GRAFT ARM;  Surgeon: Marty Heck, MD;  Location: [facility];  Service: Vascular;  Laterality: Left;   IR FLUORO GUIDE CV LINE RIGHT  04/13/2020   IR FLUORO GUIDE CV LINE RIGHT  01/31/2022   IR REMOVAL TUN CV CATH W/O FL  01/14/2021   IR US GUIDE VASC ACCESS RIGHT  04/13/2020   IR US GUIDE VASC ACCESS RIGHT  01/31/2022   IR US GUIDE VASC ACCESS RIGHT  01/31/2022   KNEE ARTHROSCOPY Bilateral      Social History:   reports that he has been smoking cigarettes. He has a 17.50 pack-year smoking history. He has been exposed to tobacco smoke. He has never used smokeless tobacco. He reports that he does not currently use drugs after having used the following drugs: Marijuana. He reports that he does not drink alcohol.   Family History:  His family history includes Breast cancer in his sister; Diabetes in his sister; Epilepsy in his father; Heart attack in his brother; Hypertension in his father; Other in his mother;  Stroke in his brother and father.   Allergies Allergies  Allergen Reactions   Oxycodone Nausea And Vomiting    NOT DOCUMENTED ON MAR     Home Medications  Prior to Admission medications   Medication Sig Start Date End Date Taking? Authorizing Provider  ACCU-CHEK AVIVA PLUS test strip USE 1 strip TO test blood sugar 2 TIMES DAILY TO 3 TIMES DAILY 06/10/19   [name], MD  acetaminophen (TYLENOL) 325 MG tablet Take 650 mg by mouth every 6 (six) hours as needed for moderate pain, fever or mild pain.    [provider]  b complex-vitamin c-folic acid (NEPHRO-VITE) 0.8 MG TABS tablet Take 1 tablet by mouth daily.    [provider]  bictegravir-emtricitabine-tenofovir AF (BIKTARVY) 50-200-25 MG TABS tablet Take 1 tablet by mouth daily. 09/27/22   Dwyane Dee, MD  blood glucose meter kit and supplies KIT Dispense based on patient and insurance preference. Check blood sugar 4 times a day 06/11/18   [name], MD  calcium acetate (PHOSLO) 667 MG capsule Take 2,001 mg by mouth 3 (three) times daily with meals.    [provider]  carvedilol (COREG) 6.25 MG tablet Take 1 tablet (6.25 mg total) by mouth 2 (two) times daily with a meal. 09/26/22   Dwyane Dee, MD  clopidogrel (PLAVIX) 75 MG tablet Take 1 tablet (75 mg total) by mouth daily. 03/07/20   [name], MD  docusate sodium (COLACE) 100 MG capsule Take 100 mg by mouth daily.    [provider]  doxepin (SINEQUAN) 75 MG capsule Take 75 mg by mouth at bedtime.    [provider]  epoetin alfa-epbx (RETACRIT) 12878 UNIT/ML injection Inject 20,000 Units into the skin every Monday.    [provider]  Ergocalciferol 50 MCG (2000 UT) TABS Take 2,000 Units by mouth daily.    [provider]  hydrALAZINE (APRESOLINE) 10 MG tablet Take 10 mg by mouth 3 (three) times daily.    [provider]  hydrocortisone cream 1 % Apply 1 Application topically every 6  (six) hours as needed for itching. Apply to affected areas on back arms    [provider]  insulin glargine-yfgn (SEMGLEE) 100 UNIT/ML Pen Inject 5 Units into the skin at bedtime.    [provider]  Insulin Pen Needle (SURE COMFORT PEN NEEDLES) 31G X 8 MM MISC Use as directed twice daily with Novolog flex pen 02/10/19   Koberlein, Junell C, MD  levETIRAcetam (KEPPRA) 500 MG tablet Take 500 mg by mouth 2 (two) times daily.    [provider]  Lidocaine 3 % CREA Apply 1 application topically See admin instructions. Apply 1 times a day on Tuesday, Thursday and Saturday prior to dialysis    [provider]  Misc. Devices (COMMODE 3-IN-1) MISC Use as directed 02/24/20   [name], MD  Misc. Devices (TRANSFER BENCH) MISC Use as directed 02/24/20   Koberlein, [name], MD  Nutritional Supplements (FEEDING SUPPLEMENT, NEPRO CARB STEADY,) LIQD Take 237 mLs by mouth 3 (three) times daily between meals.    [provider]  ondansetron (ZOFRAN) 4 MG tablet Take 4 mg by mouth every 8 (eight) hours as needed for vomiting or nausea. 12/09/21   [provider]  oxyCODONE (OXY IR/ROXICODONE) 5 MG immediate release tablet Take 1 tablet (5 mg total) by mouth every 4 (four) hours as needed for severe pain or breakthrough pain. 09/26/22   Dwyane Dee, MD  pantoprazole (PROTONIX) 40 MG tablet TAKE 1 TABLET BY MOUTH EVERY DAY Patient taking differently: Take 40 mg by mouth daily. 01/23/20   Koberlein, [name], MD  rosuvastatin (CRESTOR) 5 MG tablet Take 5 mg by mouth daily. 12/03/21   [provider]  tamsulosin (FLOMAX) 0.4 MG CAPS capsule TAKE 1 CAPSULE BY MOUTH EVERY DAY Patient taking differently: Take 0.4 mg by mouth daily. 03/28/20   [name], MD  triamcinolone cream (KENALOG) 0.1 % Apply 1 Application topically in the morning and at bedtime. Apply to back and arms for rash twice a day 08/07/22   [provider]  [place] X 5/16" 1 ML MISC Use pen needle with insulin 2 times daily 03/31/19   [name], MD     Critical care time:   CRITICAL CARE Performed by: Lanier Clam   Total critical care time: 80 minutes  Critical care time was exclusive of separately billable procedures and treating other patients.  Critical care was necessary to treat or prevent imminent or life-threatening deterioration.  Critical care was time spent personally by me on the following activities: development of treatment plan with patient and/or surrogate as well as nursing, discussions with consultants, evaluation of patient's response to treatment, examination of patient, obtaining history from patient or surrogate, ordering and performing treatments and interventions, ordering and review of laboratory studies, ordering and review of radiographic studies, pulse oximetry and re-evaluation of patient's condition.   Lanier Clam, MD [facility] Please consult Amion 10/16/2022, 8:13 AM

## 2022-10-16 NOTE — Progress Notes (Signed)
RT attempted x2 to get ABG without any success.

## 2022-10-16 NOTE — Progress Notes (Signed)
Post Hemodialysis Assessment   10/16/22 0515  Pain Assessment  Pain Scale Faces  Faces Pain Scale 6  Pain Intervention(s) RN made aware  Hemodialysis Catheter Right Internal jugular Double lumen Permanent (Tunneled)  Placement Date/Time: 01/31/22 1543   Placed prior to admission: No  Time Out: Correct patient;Correct site;Correct procedure  Maximum sterile barrier precautions: Hand hygiene;Cap;Mask;Sterile gown;Sterile gloves;Large sterile sheet  Site Prep: Chlorh...  Site Condition No complications  Blue Lumen Status Heparin locked  Red Lumen Status Heparin locked  Purple Lumen Status N/A  Catheter fill solution Heparin 1000 units/ml  Catheter fill volume (Arterial) 1.9 cc  Catheter fill volume (Venous) 1.9  Dressing Type Transparent  Dressing Status Clean, Dry, Intact;Antimicrobial disc in place  Drainage Description None  Post treatment catheter status Capped and Clamped  Neurological  Level of Consciousness Alert  Orientation Level Oriented to person  Respiratory  Respiratory Pattern Regular;Unlabored  Chest Assessment Chest expansion symmetrical  Bilateral Breath Sounds Clear;Diminished  R Upper  Breath Sounds Clear;Diminished  L Upper Breath Sounds Clear;Diminished  R Lower Breath Sounds Clear;Diminished  L Lower Breath Sounds Clear;Diminished  Cough None  Cardiac  Pulse Regular  Heart Sounds S1, S2  ECG Monitor Yes  Cardiac Rhythm NSR  Psychosocial  Psychosocial (WDL) X  Patient Behaviors Calm  Emotional support given Given to patient

## 2022-10-16 NOTE — ED Notes (Signed)
ED TO INPATIENT HANDOFF REPORT  ED Nurse Name and Phone #:   S Name/Age/Gender Richard [name] 67 y.o. male Room/Bed: 029C/029C  Code Status   Code Status: Full Code  Home/SNF/Other Nursing Home Patient oriented to: self Is this baseline? No   Triage Complete: Triage complete  Chief Complaint Gangrene ([place]) [I96] Hyperkalemia [E87.5] Uremia [N19] Encephalopathy [G93.40] ESRD on dialysis ([place]) [N18.6, Z99.2] Severe sepsis ([place]) [A41.9, R65.20]  Triage Note Patient BIB EMS from [facility] home to have left necrotic foot evaluated. Per EMS report patient has been having a mental decline since last Friday. Patient is normally able to wheel himself around in a wheelchair and is able to talk. Currently is nonverbal and not following commands. Grunts/moans with pain/movement. Supposed to get dialysis Tu,Th, and Sat. Unknown last dialysis. Hx of stroke with right sided deficits   Allergies Allergies  Allergen Reactions   Oxycodone Nausea And Vomiting    NOT DOCUMENTED ON MAR    Level of Care/Admitting Diagnosis ED Disposition     ED Disposition  Admit   Condition  --   Comment  Hospital Area: [facility] [[id number]]  Level of Care: ICU [6]  May admit patient to [facility] or [facility] if equivalent level of care is available:: No  Covid Evaluation: Asymptomatic - no recent exposure (last 10 days) testing not required  Diagnosis: Severe sepsis [facility]) [[id number]]  Admitting Physician: Lanier Clam [[id number]]  Attending Physician: Lanier Clam [[id number]]  Certification:: I certify this patient will need inpatient services for at least 2 midnights  Estimated Length of Stay: 7          B Medical/Surgery History Past Medical History:  Diagnosis Date   COVID-19 05/16/2020   Diabetes mellitus, type II, insulin dependent ([place])    ESRD on hemodialysis ([place]) 08/30/2021   GERD (gastroesophageal reflux disease)    Hematoma 03/19/2022    HIV (human immunodeficiency virus infection) ([place])    Hypertension    Meningoencephalitis    Neuropathy in diabetes ([place])    bilat feet   Recurrent falls    possible small SAH right frontal lobe 12/19/21 (Plavix held x 1 week); left transverse foramen C3 fracture 11/04/21   Ruptured lumbar disc    Seizure disorder ([place])    Stroke ([place]) 2013   residual right sided weakness (arm>leg)   Past Surgical History:  Procedure Laterality Date   AV FISTULA PLACEMENT Left 04/25/2020   Procedure: LEFT ARM BRACHIOCEPHALIC ARTERIOVENOUS (AV) FISTULA CREATION;  Surgeon: Rosetta Posner, MD;  Location: [facility];  Service: Vascular;  Laterality: Left;   AV FISTULA PLACEMENT Left 02/28/2022   Procedure: LEFT ARM INSERTION OF ARTERIOVENOUS (AV) GORE-TEX GRAFT ARM;  Surgeon: Marty Heck, MD;  Location: [facility];  Service: Vascular;  Laterality: Left;   IR FLUORO GUIDE CV LINE RIGHT  04/13/2020   IR FLUORO GUIDE CV LINE RIGHT  01/31/2022   IR REMOVAL TUN CV CATH W/O FL  01/14/2021   IR US GUIDE VASC ACCESS RIGHT  04/13/2020   IR US GUIDE VASC ACCESS RIGHT  01/31/2022   IR US GUIDE VASC ACCESS RIGHT  01/31/2022   KNEE ARTHROSCOPY Bilateral      A IV Location/Drains/Wounds Patient Lines/Drains/Airways Status     Active Line/Drains/Airways     Name Placement date Placement time Site Days   CVC Triple Lumen 10/16/22 10/16/22  0945  -- less than 1   Fistula / Graft Left Upper arm 01/10/21  2005  Upper arm  644   Fistula / Graft Left Upper arm Arteriovenous vein graft 02/28/22  0820  Upper arm  230   Hemodialysis Catheter Right Internal jugular Double lumen Permanent (Tunneled) 01/31/22  1543  Internal jugular  258   Wound / Incision (Open or Dehisced) 01/31/22 Puncture Neck Right Venous access for HD catheter insertion 01/31/22  1556  Neck  258   Wound / Incision (Open or Dehisced) 01/31/22 Puncture Chest Right;Upper Tunneled HD insertion site 01/31/22  1601  Chest  258            Intake/Output Last 24  hours  Intake/Output Summary (Last 24 hours) at 10/16/2022 1246 Last data filed at 10/16/2022 0510 Gross per 24 hour  Intake --  Output -1790 ml  Net 1790 ml    Labs/Imaging Results for orders placed or performed during the hospital encounter of 10/01/2022 (from the past 48 hour(s))  Resp panel by RT-PCR (RSV, Flu A&B, Covid) Anterior Nasal Swab     Status: None   Collection Time: 10/22/2022  6:53 PM   Specimen: Anterior Nasal Swab  Result Value Ref Range   SARS Coronavirus 2 by RT PCR NEGATIVE NEGATIVE    Comment: (NOTE) SARS-CoV-2 target nucleic acids are NOT DETECTED.  The SARS-CoV-2 RNA is generally detectable in upper respiratory specimens during the acute phase of infection. The lowest concentration of SARS-CoV-2 viral copies this assay can detect is 138 copies/mL. A negative result does not preclude SARS-Cov-2 infection and should not be used as the sole basis for treatment or other patient management decisions. A negative result may occur with  improper specimen collection/handling, submission of specimen other than nasopharyngeal swab, presence of viral mutation(s) within the areas targeted by this assay, and inadequate number of viral copies(<138 copies/mL). A negative result must be combined with clinical observations, patient history, and epidemiological information. The expected result is Negative.  Fact Sheet for Patients:  EntrepreneurPulse.com.au  Fact Sheet for Healthcare Providers:  IncredibleEmployment.be  This test is no t yet approved or cleared by the [place] FDA and  has been authorized for detection and/or diagnosis of SARS-CoV-2 by FDA under an Emergency Use Authorization (EUA). This EUA will remain  in effect (meaning this test can be used) for the duration of the COVID-19 declaration under Section 564(b)(1) of the Act, 21 U.S.C.section 360bbb-3(b)(1), unless the authorization is terminated  or revoked sooner.        Influenza A by PCR NEGATIVE NEGATIVE   Influenza B by PCR NEGATIVE NEGATIVE    Comment: (NOTE) The Xpert Xpress SARS-CoV-2/FLU/RSV plus assay is intended as an aid in the diagnosis of influenza from Nasopharyngeal swab specimens and should not be used as a sole basis for treatment. Nasal washings and aspirates are unacceptable for Xpert Xpress SARS-CoV-2/FLU/RSV testing.  Fact Sheet for Patients: EntrepreneurPulse.com.au  Fact Sheet for Healthcare Providers: IncredibleEmployment.be  This test is not yet approved or cleared by the [place] FDA and has been authorized for detection and/or diagnosis of SARS-CoV-2 by FDA under an Emergency Use Authorization (EUA). This EUA will remain in effect (meaning this test can be used) for the duration of the COVID-19 declaration under Section 564(b)(1) of the Act, 21 U.S.C. section 360bbb-3(b)(1), unless the authorization is terminated or revoked.     Resp Syncytial Virus by PCR NEGATIVE NEGATIVE    Comment: (NOTE) Fact Sheet for Patients: EntrepreneurPulse.com.au  Fact Sheet for Healthcare Providers: IncredibleEmployment.be  This test is not yet approved or cleared  by the [place] and has been authorized for detection and/or diagnosis of SARS-CoV-2 by FDA under an Emergency Use Authorization (EUA). This EUA will remain in effect (meaning this test can be used) for the duration of the COVID-19 declaration under Section 564(b)(1) of the Act, 21 U.S.C. section 360bbb-3(b)(1), unless the authorization is terminated or revoked.  Performed at [facility], [place] [address], [place], [place] [address]   Comprehensive metabolic panel     Status: Abnormal   Collection Time: 10/20/2022  9:07 PM  Result Value Ref Range   Sodium 144 135 - 145 mmol/L   Potassium >7.5 (HH) 3.5 - 5.1 mmol/L    Comment: CRITICAL RESULT CALLED TO, READ BACK BY AND  VERIFIED WITH A.TOWSLEY,RN. 2345 10/01/2022. LPAIT   Chloride 96 (L) 98 - 111 mmol/L   CO2 19 (L) 22 - 32 mmol/L   Glucose, Bld 170 (H) 70 - 99 mg/dL    Comment: Glucose reference range applies only to samples taken after fasting for at least 8 hours.   BUN 172 (H) 8 - 23 mg/dL   Creatinine, Ser 22.57 (H) 0.61 - 1.24 mg/dL   Calcium 10.4 (H) 8.9 - 10.3 mg/dL   Total Protein 7.9 6.5 - 8.1 g/dL   Albumin 2.7 (L) 3.5 - 5.0 g/dL   AST 23 15 - 41 U/L   ALT 14 0 - 44 U/L   Alkaline Phosphatase 119 38 - 126 U/L   Total Bilirubin 0.7 0.3 - 1.2 mg/dL   GFR, Estimated 2 (L) >60 mL/min    Comment: (NOTE) Calculated using the CKD-EPI Creatinine Equation (2021)    Anion gap 29 (H) 5 - 15    Comment: ELECTROLYTES REPEATED TO VERIFY Performed at [facility], [place] [address] [place], [place] [address]   Lipase, blood     Status: None   Collection Time: 10/17/2022  9:07 PM  Result Value Ref Range   Lipase 42 11 - 51 U/L    Comment: Performed at [facility] [address], [place], [place] [address]  Lactic acid, plasma     Status: Abnormal   Collection Time: 10/12/2022  9:07 PM  Result Value Ref Range   Lactic Acid, Venous 2.1 (HH) 0.5 - 1.9 mmol/L    Comment: CRITICAL RESULT CALLED TO, READ BACK BY AND VERIFIED WITH B.OLDLAND,RN. 2304 09/30/2022. LPAIT Performed at [facility], [place] [address] [place], [place] [address]   Ammonia     Status: None   Collection Time: 10/01/2022  9:07 PM  Result Value Ref Range   Ammonia 35 9 - 35 umol/L    Comment: Performed at [facility], [place] [address], [place], [place] [address]  CBG monitoring, ED     Status: None   Collection Time: 10/16/22 12:06 AM  Result Value Ref Range   Glucose-Capillary 88 70 - 99 mg/dL    Comment: Glucose reference range applies only to samples taken after fasting for at least 8 hours.  CBC with Differential     Status: Abnormal   Collection Time: 10/16/22  6:00 AM  Result Value Ref Range   WBC 16.1 (H) 4.0  - 10.5 K/uL   RBC 3.99 (L) 4.22 - 5.81 MIL/uL   Hemoglobin 12.1 (L) 13.0 - 17.0 g/dL   HCT 37.1 (L) 39.0 - 52.0 %   MCV 93.0 80.0 - 100.0 fL   MCH 30.3 26.0 - 34.0 pg   MCHC 32.6  30.0 - 36.0 g/dL   RDW 16.6 (H) 11.5 - 15.5 %   Platelets 234 150 - 400 K/uL   nRBC 0.0 0.0 - 0.2 %   Neutrophils Relative % 90 %   Neutro Abs 14.3 (H) 1.7 - 7.7 K/uL   Lymphocytes Relative 4 %   Lymphs Abs 0.7 0.7 - 4.0 K/uL   Monocytes Relative 5 %   Monocytes Absolute 0.8 0.1 - 1.0 K/uL   Eosinophils Relative 0 %   Eosinophils Absolute 0.0 0.0 - 0.5 K/uL   Basophils Relative 0 %   Basophils Absolute 0.0 0.0 - 0.1 K/uL   Immature Granulocytes 1 %   Abs Immature Granulocytes 0.23 (H) 0.00 - 0.07 K/uL    Comment: Performed at [facility] [address], [place], [place] [address]  CBC with Differential     Status: Abnormal   Collection Time: 10/16/22  7:01 AM  Result Value Ref Range   WBC 15.2 (H) 4.0 - 10.5 K/uL   RBC 3.80 (L) 4.22 - 5.81 MIL/uL   Hemoglobin 11.5 (L) 13.0 - 17.0 g/dL   HCT 35.5 (L) 39.0 - 52.0 %   MCV 93.4 80.0 - 100.0 fL   MCH 30.3 26.0 - 34.0 pg   MCHC 32.4 30.0 - 36.0 g/dL   RDW 16.7 (H) 11.5 - 15.5 %   Platelets 225 150 - 400 K/uL   nRBC 0.0 0.0 - 0.2 %   Neutrophils Relative % 86 %   Neutro Abs 13.1 (H) 1.7 - 7.7 K/uL   Lymphocytes Relative 6 %   Lymphs Abs 0.9 0.7 - 4.0 K/uL   Monocytes Relative 7 %   Monocytes Absolute 1.0 0.1 - 1.0 K/uL   Eosinophils Relative 0 %   Eosinophils Absolute 0.0 0.0 - 0.5 K/uL   Basophils Relative 0 %   Basophils Absolute 0.0 0.0 - 0.1 K/uL   Immature Granulocytes 1 %   Abs Immature Granulocytes 0.17 (H) 0.00 - 0.07 K/uL    Comment: Performed at [facility] [address] [place], [place] [address]  Comprehensive metabolic panel     Status: Abnormal   Collection Time: 10/16/22  7:01 AM  Result Value Ref Range   Sodium 137 135 - 145 mmol/L   Potassium 5.3 (H) 3.5 - 5.1 mmol/L   Chloride 96 (L) 98 - 111 mmol/L   CO2 15  (L) 22 - 32 mmol/L   Glucose, Bld 133 (H) 70 - 99 mg/dL    Comment: Glucose reference range applies only to samples taken after fasting for at least 8 hours.   BUN 91 (H) 8 - 23 mg/dL   Creatinine, Ser 12.68 (H) 0.61 - 1.24 mg/dL   Calcium 8.9 8.9 - 10.3 mg/dL   Total Protein 6.9 6.5 - 8.1 g/dL   Albumin 2.2 (L) 3.5 - 5.0 g/dL   AST 23 15 - 41 U/L   ALT 15 0 - 44 U/L   Alkaline Phosphatase 92 38 - 126 U/L   Total Bilirubin 1.3 (H) 0.3 - 1.2 mg/dL   GFR, Estimated 4 (L) >60 mL/min    Comment: (NOTE) Calculated using the CKD-EPI Creatinine Equation (2021)    Anion gap 26 (H) 5 - 15    Comment: ELECTROLYTES REPEATED TO VERIFY Performed at [facility], [place] [address], [place], [place] [address]   Lactic acid, plasma     Status: None   Collection Time: 10/16/22  7:05 AM  Result Value  Ref Range   Lactic Acid, Venous 1.3 0.5 - 1.9 mmol/L    Comment: Performed at [facility] [address] [place], [place] [address]   DG Abd 1 View  Result Date: 10/16/2022 CLINICAL DATA:  MRI clearance EXAM: ABDOMEN - 1 VIEW COMPARISON:  09/27/2022 FINDINGS: EKG leads project over abdomen. Nonobstructive bowel gas pattern. Scattered atherosclerotic calcifications. Bones demineralized. No metallic foreign bodies identified. IMPRESSION: No orbital metallic foreign bodies. Electronically Signed   By: Lavonia Dana M.D.   On: 10/16/2022 12:10   DG CHEST PORT 1 VIEW  Result Date: 10/16/2022 CLINICAL DATA:  Central venous catheter in place. EXAM: PORTABLE CHEST 1 VIEW COMPARISON:  One-view chest x-ray 10/08/2022 FINDINGS: Right IJ dialysis catheter is stable. The tip is in the right atrium. A new left IJ line has been placed. The tip is at the cavoatrial junction. No pneumothorax is present. The heart size is normal. Lung volumes are low. No edema or effusion is present. No focal airspace disease is present. IMPRESSION: 1. Interval placement of left IJ line without radiographic evidence for  complication. 2. Stable right IJ dialysis catheter. 3. Low lung volumes. Electronically Signed   By: San Morelle M.D.   On: 10/16/2022 09:56   DG Foot Complete Left  Result Date: 10/16/2022 CLINICAL DATA:  Wound EXAM: LEFT FOOT - COMPLETE 3 VIEW COMPARISON:  None Available. FINDINGS: Subtalar and talonavicular degenerative changes identified with sclerosis and osteophytes. No acute fracture, dislocation or subluxation identified. No osseous destructive process identified. No radiopaque foreign bodies are seen. IMPRESSION: Degenerative changes. No acute osseous abnormality and no bony destructive process identified. Electronically Signed   By: Sammie Bench M.D.   On: 09/25/2022 19:55   DG Chest 1 View  Result Date: 10/05/2022 CLINICAL DATA:  Wound infection screening EXAM: CHEST  1 VIEW COMPARISON:  09/30/2022 FINDINGS: Cardiac silhouette is unremarkable. No pneumothorax or pleural effusion. The lungs are clear. Aorta is calcified. The visualized skeletal structures are unremarkable. Hemodialysis catheter tip overlies RA/SVC. IMPRESSION: No acute cardiopulmonary process. Electronically Signed   By: Sammie Bench M.D.   On: 10/12/2022 19:51   CT Head Wo Contrast  Result Date: 10/22/2022 CLINICAL DATA:  Delirium EXAM: CT HEAD WITHOUT CONTRAST TECHNIQUE: Contiguous axial images were obtained from the base of the skull through the vertex without intravenous contrast. RADIATION DOSE REDUCTION: This exam was performed according to the departmental dose-optimization program which includes automated exposure control, adjustment of the mA and/or kV according to patient size and/or use of iterative reconstruction technique. COMPARISON:  CT brain 09/27/2022 FINDINGS: Brain: No acute territorial infarction, hemorrhage or intracranial mass. Moderate atrophy. Mild chronic small vessel ischemic changes of the white matter. Stable ventricle size. Vascular: No hyperdense vessels.  Carotid vascular  calcification Skull: Normal. Negative for fracture or focal lesion. Sinuses/Orbits: No acute finding. Old appearing deformity medial wall left orbit. Other: None IMPRESSION: 1. No CT evidence for acute intracranial abnormality. 2. Atrophy and chronic small vessel ischemic changes of the white matter. Electronically Signed   By: Donavan Foil M.D.   On: 10/18/2022 19:42    Pending Labs Unresulted Labs (From admission, onward)     Start     Ordered   10/16/22 0906  Beta-hydroxybutyric acid  Once,   URGENT        10/16/22 0905   10/16/22 0705  Blood culture (routine x 2)  BLOOD CULTURE X 2,   R (with STAT occurrences)      10/16/22  2993   10/16/22 0705  Lactic acid, plasma  Now then every 2 hours,   R (with STAT occurrences)      10/16/22 0704   10/16/22 0018  Hepatitis B surface antigen  (New Admission Hemo Labs (Hepatitis B))  Once,   URGENT        10/16/22 0020   10/16/22 0018  Hepatitis B surface antibody,quantitative  (New Admission Hemo Labs (Hepatitis B))  Once,   URGENT        10/16/22 0020   09/25/2022 2148  CBC with Differential/Platelet  Once,   R        09/26/2022 2148   10/05/2022 2130  C-reactive protein  Once,   URGENT        09/26/2022 2130   10/12/2022 1853  Blood gas, venous (at [facility])  Once,   R        09/26/2022 1852   10/20/2022 1852  CBC with Differential  (ED Abdominal Pain)  Once,   STAT        10/01/2022 1852   10/12/2022 1852  Urinalysis, Routine w reflex microscopic -Urine, Clean Catch  (ED Abdominal Pain)  Once,   URGENT       Question:  Specimen Source  Answer:  Urine, Clean Catch   10/13/2022 1852   Signed and Held  CBC  (heparin)  Once,   R       Comments: Baseline for heparin therapy IF NOT ALREADY DRAWN.  Notify MD if PLT < 100 K.    Signed and Held   Signed and Held  Creatinine, serum  (heparin)  Once,   R       Comments: Baseline for heparin therapy IF NOT ALREADY DRAWN.    Signed and Held   Signed and Held  CBC  Tomorrow morning,   R        Signed and Held    Signed and Held  Basic metabolic panel  Tomorrow morning,   R        Signed and Held   Signed and Held  Magnesium  Tomorrow morning,   R        Signed and Held   Signed and Held  Phosphorus  Tomorrow morning,   R        Signed and Held            Vitals/Pain Today's Vitals   10/16/22 0948 10/16/22 1012 10/16/22 1054 10/16/22 1130  BP:  (!) 93/57 114/80 116/84  Pulse:  (!) 102 (!) 103 (!) 102  Resp:  14 13 15   Temp:   (!) 96.7 F (35.9 C)   TempSrc:   Axillary   SpO2:      Weight: 65.8 kg     Height: 6' (1.829 m)       Isolation Precautions No active isolations  Medications Medications  sodium zirconium cyclosilicate (LOKELMA) packet 10 g (10 g Oral Not Given 10/16/22 0942)  calcium gluconate 1 g/ 50 mL sodium chloride IVPB (1,000 mg Intravenous Not Given 10/16/22 0943)  Chlorhexidine Gluconate Cloth 2 % PADS 6 each (6 each Topical Not Given 10/16/22 0943)  ceFEPIme (MAXIPIME) 1 g in sodium chloride 0.9 % 100 mL IVPB (1 g Intravenous New Bag/Given 10/16/22 1240)  vancomycin (VANCOREADY) IVPB 1500 mg/300 mL (1,500 mg Intravenous New Bag/Given 10/16/22 1243)  vancomycin (VANCOREADY) IVPB 750 mg/150 mL (has no administration in time range)    Mobility non-ambulatory     Focused Assessments Renal  Assessment Handoff:  Hemodialysis Schedule: Hemodialysis Schedule: Tuesday/Thursday/Saturday Last Hemodialysis date and time: unknown   Restricted appendage:     R Recommendations: See Admitting Provider Note  Report given to:   Additional Notes:

## 2022-10-16 NOTE — ED Provider Notes (Signed)
Patient signed out pending lab work.  Lab work notable for hyperkalemia with a potassium of 7.5.  Insulin, glucose, calcium, and Lokelma were ordered.  Nephrology was advised of the patient's need for emergent dialysis.  Unfortunately, patient left the emergency department for dialysis prior to obtaining ongoing lab work.  Per nursing, IV access was lost prior to leaving the department.    6:00am Patient is now back in the emergency department.  Per dialysis nurse, they were unable to pull off fluid secondary to patient's blood pressure.  He did receive 2-1/2 hours of dialysis.  On my evaluation he is critically ill appearing and chronically ill-appearing.  He is minimally responsive.  He is encephalopathic likely secondary to uremia.  I attempted IV access x 2.  I was able to get some blood but unable to flush catheters in the bilateral antecubitals.  Requested IV consult.  Patient has contracture of the bilateral upper extremities and also does not follow directions and will not turn his head for me to look for EJ access.  7:01 AM Quested critical care evaluation.  Unable to establish goals of care.  Patient appears to be his own serrogate based on chart review.  I did call and this Haroldine Laws who is listed as a significant other on his paperwork; however, the phone number is unavailable.  Previously has been full code.  He is significantly altered and encephalopathic.  He is very dry.  Blood pressure is labile.  They were unable to pull any fluid off during dialysis.  I did straight stick him for blood and requested blood cultures and repeat labs.  Physical Exam  BP 101/68   Pulse (!) 106   Temp 97.6 F (36.4 C)   Resp 15   SpO2 99%   Physical Exam Critically ill-appearing Mucous membranes very dry, minimally responsive Contractured Procedures  .Critical Care  Performed by: Merryl Hacker, MD Authorized by: Merryl Hacker, MD   Critical care provider statement:     Critical care time (minutes):  75   Critical care was necessary to treat or prevent imminent or life-threatening deterioration of the following conditions:  Metabolic crisis (encephalopathy, gangrene, esrd)   Critical care was time spent personally by me on the following activities:  Development of treatment plan with patient or surrogate, discussions with consultants, evaluation of patient's response to treatment, examination of patient, ordering and review of laboratory studies, ordering and review of radiographic studies, ordering and performing treatments and interventions, pulse oximetry, re-evaluation of patient's condition and review of old charts   ED Course / MDM   Clinical Course as of 10/16/22 0625  Wed Oct 15, 2022  2036 Concern for gangrene to left foot, tissue necrosis noted on exam [SG]  2255 Spoke w/ Dr Lyla Glassing, recommends MRI wo, admit to medicine, will see in AM.  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 Radiology: ordered.  Risk OTC drugs. Prescription drug management.   Problem List Items Addressed This Visit       Other   Hyperkalemia   Other Visit Diagnoses     Gangrene ([place])    -  Primary   Uremia       Encephalopathy  ESRD on dialysis [facility])                 Merryl Hacker, MD 10/16/22 [phone]

## 2022-10-16 NOTE — Progress Notes (Signed)
Pt receives out-pt HD at [facility] on TTS. Chart reviewed and appears pt admitted from snf. Will assist as needed.   Melven Sartorius Renal Navigator [phone]

## 2022-10-17 ENCOUNTER — Inpatient Hospital Stay (HOSPITAL_COMMUNITY): Payer: Medicare HMO

## 2022-10-17 DIAGNOSIS — Z7189 Other specified counseling: Secondary | ICD-10-CM

## 2022-10-17 DIAGNOSIS — A419 Sepsis, unspecified organism: Secondary | ICD-10-CM | POA: Diagnosis not present

## 2022-10-17 DIAGNOSIS — N186 End stage renal disease: Secondary | ICD-10-CM | POA: Diagnosis not present

## 2022-10-17 DIAGNOSIS — G9340 Encephalopathy, unspecified: Secondary | ICD-10-CM | POA: Diagnosis not present

## 2022-10-17 DIAGNOSIS — Z992 Dependence on renal dialysis: Secondary | ICD-10-CM

## 2022-10-17 DIAGNOSIS — I96 Gangrene, not elsewhere classified: Secondary | ICD-10-CM

## 2022-10-17 DIAGNOSIS — Z515 Encounter for palliative care: Secondary | ICD-10-CM | POA: Diagnosis not present

## 2022-10-17 DIAGNOSIS — N19 Unspecified kidney failure: Secondary | ICD-10-CM

## 2022-10-17 LAB — CBC
HCT: 30.5 % — ABNORMAL LOW (ref 39.0–52.0)
Hemoglobin: 10 g/dL — ABNORMAL LOW (ref 13.0–17.0)
MCH: 30.4 pg (ref 26.0–34.0)
MCHC: 32.8 g/dL (ref 30.0–36.0)
MCV: 92.7 fL (ref 80.0–100.0)
Platelets: 178 10*3/uL (ref 150–400)
RBC: 3.29 MIL/uL — ABNORMAL LOW (ref 4.22–5.81)
RDW: 16.4 % — ABNORMAL HIGH (ref 11.5–15.5)
WBC: 12.7 10*3/uL — ABNORMAL HIGH (ref 4.0–10.5)
nRBC: 0 % (ref 0.0–0.2)

## 2022-10-17 LAB — BLOOD CULTURE ID PANEL (REFLEXED) - BCID2

## 2022-10-17 LAB — BASIC METABOLIC PANEL
Anion gap: 17 — ABNORMAL HIGH (ref 5–15)
BUN: 99 mg/dL — ABNORMAL HIGH (ref 8–23)
CO2: 21 mmol/L — ABNORMAL LOW (ref 22–32)
Calcium: 8.7 mg/dL — ABNORMAL LOW (ref 8.9–10.3)
Chloride: 100 mmol/L (ref 98–111)
Creatinine, Ser: 12.69 mg/dL — ABNORMAL HIGH (ref 0.61–1.24)
GFR, Estimated: 4 mL/min — ABNORMAL LOW (ref >60)
Glucose, Bld: 179 mg/dL — ABNORMAL HIGH (ref 70–99)
Potassium: 4.8 mmol/L (ref 3.5–5.1)
Sodium: 138 mmol/L (ref 135–145)

## 2022-10-17 LAB — GLUCOSE, CAPILLARY
Glucose-Capillary: 155 mg/dL — ABNORMAL HIGH (ref 70–99)
Glucose-Capillary: 158 mg/dL — ABNORMAL HIGH (ref 70–99)
Glucose-Capillary: 160 mg/dL — ABNORMAL HIGH (ref 70–99)
Glucose-Capillary: 164 mg/dL — ABNORMAL HIGH (ref 70–99)
Glucose-Capillary: 80 mg/dL (ref 70–99)
Glucose-Capillary: 91 mg/dL (ref 70–99)
Glucose-Capillary: 98 mg/dL (ref 70–99)

## 2022-10-17 LAB — LACTIC ACID, PLASMA: Lactic Acid, Venous: 1.1 mmol/L (ref 0.5–1.9)

## 2022-10-17 LAB — PHOSPHORUS
Phosphorus: 6.4 mg/dL — ABNORMAL HIGH (ref 2.5–4.6)
Phosphorus: 2.4 mg/dL — ABNORMAL LOW (ref 2.5–4.6)

## 2022-10-17 LAB — CD4/CD8 (T-HELPER/T-SUPPRESSOR CELL)
CD4 absolute: 190 /uL — ABNORMAL LOW (ref 400–1790)
CD4%: 19.3 % — ABNORMAL LOW (ref 33–65)
CD8 T Cell Abs: 448 /uL (ref 190–1000)
CD8tox: 45.52 % — ABNORMAL HIGH (ref 12–40)
Ratio: 0.42 — ABNORMAL LOW (ref 1.0–3.0)
Total lymphocyte count: 985 /uL — ABNORMAL LOW (ref 1000–4000)

## 2022-10-17 LAB — MAGNESIUM
Magnesium: 2.6 mg/dL — ABNORMAL HIGH (ref 1.7–2.4)
Magnesium: 2 mg/dL (ref 1.7–2.4)

## 2022-10-17 LAB — HEPATITIS B SURFACE ANTIBODY, QUANTITATIVE: Hep B S AB Quant (Post): <3.1 m[IU]/mL — ABNORMAL LOW (ref >9.9)

## 2022-10-17 MED ORDER — OSMOLITE 1.5 CAL PO LIQD
1000.0000 mL | ORAL | Status: DC
  Administered 2022-10-17: 1000 mL

## 2022-10-17 MED ORDER — SODIUM CHLORIDE 0.9% IV SOLUTION: Freq: Once | INTRAVENOUS | Status: DC

## 2022-10-17 MED ORDER — BICTEGRAVIR-EMTRICITAB-TENOFOV 50-200-25 MG PO TABS
1.0000 | ORAL_TABLET | Freq: Every day | ORAL | Status: DC
  Filled 2022-10-17: qty 1

## 2022-10-17 MED ORDER — HEPARIN SODIUM (PORCINE) 1000 UNIT/ML DIALYSIS
1600.0000 [IU] | Freq: Once | INTRAMUSCULAR | Status: AC
  Filled 2022-10-17: qty 2

## 2022-10-17 MED ORDER — DEXTROSE IN LACTATED RINGERS 5 % IV SOLN: INTRAVENOUS | Status: DC

## 2022-10-17 MED ORDER — HEPARIN SODIUM (PORCINE) 1000 UNIT/ML IJ SOLN
INTRAMUSCULAR | Status: AC
  Administered 2022-10-17: 1600 [IU] via INTRAVENOUS_CENTRAL
  Filled 2022-10-17: qty 4

## 2022-10-17 MED ORDER — PIPERACILLIN-TAZOBACTAM IN DEX 2-0.25 GM/50ML IV SOLN
2.2500 g | Freq: Three times a day (TID) | INTRAVENOUS | Status: DC
  Administered 2022-10-17 – 2022-10-18 (×4): 2.25 g via INTRAVENOUS
  Filled 2022-10-17 (×5): qty 50

## 2022-10-17 MED ORDER — MEDIHONEY WOUND/BURN DRESSING EX PSTE
1.0000 | PASTE | Freq: Every day | CUTANEOUS | Status: DC
  Filled 2022-10-17: qty 44

## 2022-10-17 MED ORDER — VANCOMYCIN HCL 750 MG/150ML IV SOLN
750.0000 mg | Freq: Once | INTRAVENOUS | Status: AC
  Administered 2022-10-17: 750 mg via INTRAVENOUS
  Filled 2022-10-17: qty 150

## 2022-10-17 MED ORDER — JUVEN PO PACK
1.0000 | PACK | Freq: Two times a day (BID) | ORAL | Status: DC
  Administered 2022-10-18: 1

## 2022-10-17 MED ORDER — SODIUM CHLORIDE 0.9 % IV SOLN
1.0000 g | INTRAVENOUS | Status: DC
  Filled 2022-10-17: qty 10

## 2022-10-17 MED ORDER — CHLORHEXIDINE GLUCONATE CLOTH 2 % EX PADS: 6.0000 | MEDICATED_PAD | Freq: Every day | CUTANEOUS | Status: DC

## 2022-10-17 MED ORDER — THIAMINE HCL 100 MG/ML IJ SOLN
100.0000 mg | Freq: Every day | INTRAMUSCULAR | Status: DC
  Administered 2022-10-17: 100 mg via INTRAVENOUS
  Filled 2022-10-17: qty 2

## 2022-10-17 MED ORDER — PROSOURCE TF20 ENFIT COMPATIBL EN LIQD
60.0000 mL | Freq: Two times a day (BID) | ENTERAL | Status: DC
  Administered 2022-10-17 – 2022-10-18 (×2): 60 mL
  Filled 2022-10-17 (×2): qty 60

## 2022-10-17 MED ORDER — SODIUM CHLORIDE 0.9 % IV SOLN: INTRAVENOUS | Status: DC | PRN

## 2022-10-17 NOTE — TOC Progression Note (Signed)
Transition of Care [facility]) - Initial/Assessment Note    Patient Details  Name: Richard [name] MRN: [id number] Date of Birth: Aug 03, 1956  Transition of Care [facility]) CM/SW Contact:    Milinda Antis, LCSWA Phone Number: 10/17/2022, 10:23 AM  Clinical Narrative:                 LCSW contacted the [facility], the facility where the patient is a resident.  Administration at the facility reviewed the patient's chart and there are no family members listed.    TOC following.        Patient Goals and CMS Choice            Expected Discharge Plan and Services                                              Prior Living Arrangements/Services                       Activities of Daily Living      Permission Sought/Granted                  Emotional Assessment              Admission diagnosis:  Gangrene ([place]) [I96] Hyperkalemia [E87.5] Uremia [N19] Encephalopathy [G93.40] ESRD on dialysis ([place]) [N18.6, Z99.2] Severe sepsis ([place]) [A41.9, R65.20] Patient Active Problem List   Diagnosis Date Noted   Severe sepsis ([place]) 10/16/2022   Acute pancreatitis 09/20/2022   High anion gap metabolic acidosis 48/54/6270   Renal failure 09/03/2022   Acute respiratory failure with hypoxia ([place]) 09/01/2022   Lack of housing 03/31/2022   Hematoma 03/19/2022   Other disorders of calcium metabolism 03/05/2022   Fall from non-moving wheelchair 10/19/2021   HTN (hypertension) 10/19/2021   Hypermagnesemia 10/19/2021   Allergic rhinitis 10/19/2021   Buttock pain 08/30/2021   Volume overload 04/14/2021   Elevated troponin 02/18/2021   Acute on chronic diastolic CHF (congestive heart failure) ([place]) 02/18/2021   Depression 02/18/2021   Hemorrhoids 01/11/2021   Hyperkalemia 01/10/2021   Dialysis patient, noncompliant    Dependence on supplemental oxygen 12/20/2020   ESRD (end stage renal disease) on dialysis ([place]) 12/20/2020   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 ([place]) 12/20/2020   Human immunodeficiency virus (HIV) disease ([place]) 12/20/2020   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   Syncope 06/01/2020   Secondary hyperparathyroidism of renal origin ([place]) 05/28/2020   Hypokalemia 05/16/2020   Allergy, unspecified, initial encounter 05/08/2020   Anaphylactic shock, unspecified, initial encounter 05/08/2020   Anemia in chronic kidney disease 35/00/9381   Complication of vascular dialysis catheter 05/08/2020   Dependence on renal dialysis ([place]) 05/08/2020   Headache, unspecified 05/08/2020   Iron deficiency anemia, unspecified 05/08/2020   Cerebellar stroke syndrome 05/07/2020   Encounter for feeding tube placement    Weakness    Creatinine elevation    Goals of care, counseling/discussion    Hypertensive urgency    Palliative care by specialist    AIDS (acquired immune deficiency syndrome) ([place]) 04/13/2020   Seizure disorder ([place]) 04/10/2020   History of cerebrovascular accident (CVA) with residual deficit 03/19/2020   Neuropathy in diabetes ([place])    Migraines    GERD (gastroesophageal reflux disease)    Type 2 diabetes mellitus with hyperlipidemia ([place])  Arthritis    Hyperlipidemia 08/25/2012   Chronic kidney disease, stage 4 (severe) ([place]) 08/23/2012   Tobacco abuse 08/23/2012   Malnutrition of moderate degree ([place]) 08/23/2012   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 05/21/2012   Hypertensive emergency 05/21/2012   PCP:  Clinic, [place]:   [place], [place] [address] [place] [address] Phone: [phone] Fax: [place], [address] [place] [address] Phone: [phone] Fax: [phone]     Social Determinants of Health ([place]) Social History: Deport: No Food Insecurity  (09/20/2022)  Housing: Low Risk  (09/20/2022)  Transportation Needs: No Transportation Needs (09/20/2022)  Utilities: Not At Risk (09/20/2022)  Depression (PHQ2-9): Low Risk  (03/19/2022)  Financial Resource Strain: Low Risk  (07/17/2021)  Physical Activity: Inactive (07/17/2021)  Social Connections: Socially Isolated (07/17/2021)  Stress: Stress Concern Present (07/17/2021)  Tobacco Use: High Risk (10/14/2022)   SDOH Interventions:     Readmission Risk Interventions    10/22/2021   11:56 AM 02/15/2020    3:49 PM  Readmission Risk Prevention Plan  Transportation Screening Complete Complete  PCP or Specialist Appt within 5-7 Days  Not Complete  Not Complete comments  pending disposition  Home Care Screening  Complete  Medication Review (RN CM)  Referral to Pharmacy  Medication Review (RN Care Manager) Complete   PCP or Specialist appointment within 3-5 days of discharge Complete   HRI or Home Care Consult Complete   SW Recovery Care/Counseling Consult Complete   [place] Complete

## 2022-10-17 NOTE — Progress Notes (Signed)
[name] is at bedside. He is been updated on patients status and all questions answered. Melissa and Ron decided to continue care for tonight and they will be back in the morning to discuss comfort care with doctors.

## 2022-10-17 NOTE — Consult Note (Signed)
Palliative Medicine Inpatient Consult Note  Consulting Provider:  Erick Colace, NP   Reason for consult:   Richard [name] Palliative Medicine Consult  Reason for Consult? goc   10/17/2022  HPI:  Per intake H&P --> 67 year old male with end-stage renal disease on hemodialysis who was transferred from skilled care nursing facility with acute metabolic/uremic encephalopathy in the setting of missing hemodialysis and severe sepsis with left lower extremity gangrene.   Palliative care has been asked to get involved for further [place] conversations in the setting of patient having no surrogate decision maker.   Clinical Assessment/Goals of Care:  *Please note that this is a verbal dictation therefore any spelling or grammatical errors are due to the "[place]" system interpretation.  I have reviewed medical records including EPIC notes, labs and imaging, received report from bedside RN, assessed the patient who is incoherent able to babble but nothing discernable.   Per doing a chart review have identified Richard [name],Richard [name] as his only biological child and his NOK.  I called Richard [name] on the phone and explained the severity of the patients illness(s). I asked her id she would be willing to make decisions on the patients behalf. She shares that she has in the past had "issues" with her father though it able to make decisions on his behalf.    Reviewed that Richard [name] lives at [facility] and is dependent for BADLs. 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   I shared that right now he is severely septic from gangrene of his LLE. I shared that it appears his QOL has been depleting for sometime now and evidence of his continued re-hospitalizations. Reviewed his the toll each long term stay in the hospital can take on you.   I discussed openly and honestly my concern about further decompensation. I shared that even if we were to pursue a  LLE amputation patient would be in a far worse position than he is now. Discussed the potential for undue suffering.   I advocated strongly for comfort care given patients present state. We talked about transition to comfort measures in house and what that would entail inclusive of medications to control pain, dyspnea, agitation, nausea, itching, and hiccups. We discussed stopping all uneccessary measures such as cardiac monitoring, blood draws, needle sticks, and frequent vital signs.   Richard [name] is in agreement with a DNAR/DNI given patients present state and the potential to cause more harm than good.   Utilized reflective listening throughout our time together. Patients daughter shares this is all overwhelming. She wants to come see him this evening to make further decisions.   Richard [name] would like to speak to the nephrologist as well for further insights on his clinical condition.   Decision Maker: Richard [name], Richard [name] (Daughter) [phone] ([place])  SUMMARY OF RECOMMENDATIONS   DNAR/DNI  Patients daughter will come to see patient this evening  Discussed and advocated strongly for comfort measures given patients overall decline and poor health state  Appreciate CCM and Nephrology updating patients daughter when able  Ongoing PMT support  Code Status/Advance Care Planning: DNAR/DNI   Palliative Prophylaxis:  Aspiration, Bowel Regimen, Delirium Protocol, Frequent Pain Assessment, Oral Care, Palliative Wound Care, and Turn Reposition  Additional Recommendations (Limitations, Scope, Preferences): Continue current care  Psycho-social/Spiritual:  Desire for further Chaplaincy support: Yes Additional Recommendations: Education on sepsis   Prognosis: Poor overall.   Discharge Planning: Unclear at this time.   Vitals:   10/17/22  1615 10/17/22 1630  BP: 101/62 110/73  Pulse: (!) 102 (!) 104  Resp: (!) 21 (!) 22  Temp:    SpO2: 97% 98%    Intake/Output Summary (Last 24 hours) at  10/17/2022 1642 Last data filed at 10/17/2022 1300 Gross per 24 hour  Intake 1959.63 ml  Output 0 ml  Net 1959.63 ml   Last Weight  Most recent update: 10/17/2022  2:18 AM    Weight  68.9 kg (151 lb 14.4 oz)            Gen:  Chronically ill appearing AA M  HEENT: coretrack, dry mucous membranes CV: irregular rate and rhythm  PULM: On 2LPM Wing ABD: soft/nontender EXT: LLE malodorous wrapping in place  Neuro: Opens eyes though nonsensical  PPS: 10%   This conversation/these recommendations were discussed with patient primary care team, Dr. Tacy Learn   Total Time: 98 Billing based on MDM: High ______________________________________________________ [place] Team Team Cell Phone: [phone] Please utilize secure chat with additional questions, if there is no response within 30 minutes please call the above phone number  Palliative Medicine Team providers are available by phone from 7am to 7pm daily and can be reached through the team cell phone.  Should this patient require assistance outside of these hours, please call the patient's attending physician.

## 2022-10-17 NOTE — Progress Notes (Addendum)
  Progress Note    10/17/2022 7:16 AM Hospital Day 1  Subjective:  remains obtunded-awakes and mumbles    Vitals:   10/17/22 0630 10/17/22 0700  BP: (!) 116/59 96/60  Pulse: 98   Resp: (!) 26 (!) 27  Temp:    SpO2: 95%     Physical Exam: General:  sleeping-wakes to voice but mumbling Extremities:  bandage left foot and malodorous  CBC    Component Value Date/Time   WBC 12.7 (H) 10/17/2022 0352   RBC 3.29 (L) 10/17/2022 0352   HGB 10.0 (L) 10/17/2022 0352   HCT 30.5 (L) 10/17/2022 0352   PLT 178 10/17/2022 0352   MCV 92.7 10/17/2022 0352   MCH 30.4 10/17/2022 0352   MCHC 32.8 10/17/2022 0352   RDW 16.4 (H) 10/17/2022 0352   LYMPHSABS 0.9 10/16/2022 0701   MONOABS 1.0 10/16/2022 0701   EOSABS 0.0 10/16/2022 0701   BASOSABS 0.0 10/16/2022 0701    BMET    Component Value Date/Time   NA 138 10/17/2022 0352   K 4.8 10/17/2022 0352   CL 100 10/17/2022 0352   CO2 21 (L) 10/17/2022 0352   GLUCOSE 179 (H) 10/17/2022 0352   BUN 99 (H) 10/17/2022 0352   CREATININE 12.69 (H) 10/17/2022 0352   CALCIUM 8.7 (L) 10/17/2022 0352   CALCIUM 7.7 (L) 01/14/2021 1253   GFRNONAA 4 (L) 10/17/2022 0352   GFRAA 7 (L) 06/02/2020 0835    INR    Component Value Date/Time   INR 1.1 07/23/2022 0208     Intake/Output Summary (Last 24 hours) at 10/17/2022 0716 Last data filed at 10/17/2022 0700 Gross per 24 hour  Intake 2169.35 ml  Output --  Net 2169.35 ml     Assessment/Plan:  67 y.o. male with extensive left foot gangrene in need of amputation Hospital Day 1  -still no family at bedside-pt not coherent enough currently to make decision about amputation.  Continue to monitor mental status with abx to see if he is agreeable to amputation.  Will continue to follow   Leontine Locket, PA-C [facility] [phone] 10/17/2022 7:16 AM   I have seen and evaluated the patient. I agree with the PA note as documented above.  67 year old male with end-stage  renal disease that we are asked to evaluate yesterday in the ED with extensive left foot gangrene.  As discussed yesterday, I did not feel his foot is salvageable given the extent of ischemic changes.  I have offered him a left above-knee amputation.  He does have a palpable left femoral pulse.  Unfortunately yesterday he was obtunded in the ED with uremia etc.  There was no family at bedside.  He has no family contact information and came from a facility.  Today he is opening his eyes but still not coherent to make a decision about a left above-knee amputation.  He is due for dialysis today.  Fortunately not on any pressors and hemodynamically stable in the ICU.  I discussed with critical care yesterday about treating him with antibiotics, fluid resuscitation and treating his uremia to see if he can make a informed decision about amputation.  We will continue to follow to see if he is agreeable to a left above-knee amputation.  Marty Heck, MD [facility] [place] Office: [phone]

## 2022-10-17 NOTE — Procedures (Signed)
HD Note:  Some information was entered later than the data was gathered due to patient care needs. The stated time with the data is accurate.  Patient was in bed asleep when treatment began in his room.  He did not respond verbally when spoken to, but did make an audible sound when the mask was placed on his face during catheter care.  SBP was not sufficient to sustain the UF pull for the goal of 2000-3000 ml Hand-off given to patient's nurse.   Access used: HD catheter upper right chest Access issues: none  Total UF removed: 900 ml    [facility]

## 2022-10-17 NOTE — Progress Notes (Addendum)
NAME:  Richard [name], MRN:  [id number], DOB:  January 12, 1956, LOS: 0 ADMISSION DATE:  10/02/2022, CONSULTATION DATE:  10/16/22\ REFERRING MD:  EDP, CHIEF COMPLAINT:  AMS   History of Present Illness:  67 year old man presented from skilled nursing facility with necrotic foot with likely metabolic encephalopathy due to severe sepsis and lack of dialysis with significant uremia.  Patient nearly obtunded on presentation.  Eyes are open.  Protecting airway and will wince with pain.  Initial labs with a potassium of 7.5.  BUN 172 with a creatinine of 22.5.  His anion gap is 29.  He has received no antibiotics.  At time of evaluation his only IV access is his HD catheter.  Unknown time of last dialysis.  Nephrology note indicates last HD 10/07/2021. Attempted to contact [facility] skilled nursing facility multiple times.  It went straight to messaging system.   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 ORTHO consulted: felt likely needed amputation. Vascular surg consulted also felt given degree of infection Left AKA indicated but no family available. Nephro consulted. CVL placed for IV access and septic shock rx. Cefepime and vanc started. Emergent short HD session  1/26: Off pressors.  Remains encephalopathic.  Hemodynamically stable.  Still no family.  Interim History / Subjective:  Lying in bed, no obvious distress.  Objective   Blood pressure 98/64, pulse (!) 105, temperature 97.6 F (36.4 C), resp. rate 16, SpO2 99 %.        Intake/Output Summary (Last 24 hours) at 10/16/2022 0813 Last data filed at 10/16/2022 0510 Gross per 24 hour  Intake --  Output -1790 ml  Net 1790 ml   Filed Weights     Examination: General chronically ill-appearing malnourished 67 year old male patient lying in bed currently no acute distress HEENT temporal wasting mucous membranes dry poor dentition no JVD Pulmonary: Clear no accessory use currently on nasal cannula Cardiac: Slightly tachycardic, no murmur rub or gallop Abdomen: Soft nontender Extremities: Cool, the left lower extremity is necrotic and gangrenous.  He has multiple areas of skin breakdown. Neuro: Awake, speech slurred, not oriented.  Contracted on the right side.  Resolved Hospital Problem list   Septic shock resolved 1/26 Hyperkalemia resolved w/ HD Assessment & Plan:  Acute metabolic encephalopathy secondary to metabolic disarray from uremia & complicated by gangrenous left foot and encephalopathy due to severe sepsis, superimposed on prior stroke.  10/03/2022 CT of the head shows no acute abnormalities. Plan Holding off on sedation Supportive care Treat infection.  Septic shock with lactic acidosis due to gangrenous foot Cultures remain pending Plan Continue vancomycin and zosyn Repeat lactic acid today Needs amputation, unfortunately no family available.  Will ask case management to assist. Also needs goals of care discussion, consult palliative  Hypovolemia:  -Current volume status 3.9 L positive Plan Decrease IV fluid rate to 50 cc an hour  Mild Hypoxia at risk for volume overload and atx initial CXR was clear  Plan Repeat pcxr am   ESRD (TTS schedule) Plan Dialysis per nephro Follow-up chemistry  Leukocytosis: Suspect combination of dehydration, volume contraction, sepsis. -Improved Plan Follow-up CBC a.m.  HIV Plan Cont ARVs   Anion gap metabolic acidosis: Mixed picture of lactic acidosis and starvation related ketoacidosis Plan Follow-up lactate  Anemia: Stable.  Likely related to ESRD, anemia of chronic illness. No evidence of bleeding Plan Continue to trend  Sacral decubitus ulcer: Present  on admission Plan  wound care consultation  DM: Plan  Goal glucose 140-180 Continue relief units daily Continue current sensitive sliding scale  Seizure history: Plan IV keprra 500 mg BID (oral at home), can change to via tube once established  Severe protein calorie malnutrition Plan RD consultation Placed coreTrak start tube feeds Best Practice (right click and "Reselect all SmartList Selections" daily)   Diet/type: tubefeeds DVT prophylaxis: prophylactic heparin  GI prophylaxis: PPI Lines: Central line Foley:  Yes, and it is still needed Code Status:  full code Last date of multidisciplinary goals of care discussion [tbd]   CRITICAL CARE NA-->he may be able to move out if tolerates [place] ACNP-BC [place] Pager # [phone] OR # [phone] if no answer

## 2022-10-17 NOTE — Progress Notes (Signed)
PHARMACY - PHYSICIAN COMMUNICATION CRITICAL VALUE ALERT - BLOOD CULTURE IDENTIFICATION (BCID)  Richard [name] is an 67 y.o. male who presented to [facility] on 10/17/2022 with a chief complaint of L foot infection.  Assessment: 1/4 anaerobic bottle growing GPC in clusters, BCID identifying as staph epi with mecA resistance. Likely a contaminant.   Name of physician (or Provider) Contacted: Salvadore Dom, NP  Current antibiotics:  Vancomycin  Zosyn   Changes to prescribed antibiotics recommended:  Likely a contaminant but continue current antibiotics given severe foot infection and follow-up potential for above-knee amputation.   Results for orders placed or performed during the hospital encounter of 10/10/2022  Blood Culture ID Panel (Reflexed) (Collected: 10/16/2022  7:10 AM)  Result Value Ref Range   Enterococcus faecalis NOT DETECTED NOT DETECTED   Enterococcus Faecium NOT DETECTED NOT DETECTED   Listeria monocytogenes NOT DETECTED NOT DETECTED   Staphylococcus species DETECTED (A) NOT DETECTED   Staphylococcus aureus (BCID) NOT DETECTED NOT DETECTED   Staphylococcus epidermidis DETECTED (A) NOT DETECTED   Staphylococcus lugdunensis NOT DETECTED NOT DETECTED   Streptococcus species NOT DETECTED NOT DETECTED   Streptococcus agalactiae NOT DETECTED NOT DETECTED   Streptococcus pneumoniae NOT DETECTED NOT DETECTED   Streptococcus pyogenes NOT DETECTED NOT DETECTED   A.calcoaceticus-baumannii NOT DETECTED NOT DETECTED   Bacteroides fragilis NOT DETECTED NOT DETECTED   Enterobacterales NOT DETECTED NOT DETECTED   Enterobacter cloacae complex NOT DETECTED NOT DETECTED   Escherichia coli NOT DETECTED NOT DETECTED   Klebsiella aerogenes NOT DETECTED NOT DETECTED   Klebsiella oxytoca NOT DETECTED NOT DETECTED   Klebsiella pneumoniae NOT DETECTED NOT DETECTED   Proteus species NOT DETECTED NOT DETECTED   Salmonella species NOT DETECTED NOT DETECTED   Serratia marcescens NOT  DETECTED NOT DETECTED   Haemophilus influenzae NOT DETECTED NOT DETECTED   Neisseria meningitidis NOT DETECTED NOT DETECTED   Pseudomonas aeruginosa NOT DETECTED NOT DETECTED   Stenotrophomonas maltophilia NOT DETECTED NOT DETECTED   Candida albicans NOT DETECTED NOT DETECTED   Candida auris NOT DETECTED NOT DETECTED   Candida glabrata NOT DETECTED NOT DETECTED   Candida krusei NOT DETECTED NOT DETECTED   Candida parapsilosis NOT DETECTED NOT DETECTED   Candida tropicalis NOT DETECTED NOT DETECTED   Cryptococcus neoformans/gattii NOT DETECTED NOT DETECTED   Methicillin resistance mecA/C DETECTED (A) NOT DETECTED    Billey Gosling, PharmD PGY1 Pharmacy Resident 1/26/202412:36 PM

## 2022-10-17 NOTE — Progress Notes (Signed)
Initial Nutrition Assessment  DOCUMENTATION CODES:   Non-severe (moderate) malnutrition in context of chronic illness  INTERVENTION:  Initiate tube feeding via Cortrak (tip gastric): Start Osmolite 1.5 at 83ml and advance by 24ml q8h to a goal rate of 60 ml/h (1440 ml per day) Prosource TF20 60 ml BID  Provides 2320 kcal, 130 gm protein, 1097 ml free water daily  Monitor magnesium, potassium, and phosphorus BID for at least 3 days, MD to replete as needed, as pt is at risk for refeeding syndrome  Recommend supplementing 100mg  thiamine x5 days  1 packet Juven BID per tube to support wound healing  NUTRITION DIAGNOSIS:   Moderate Malnutrition related to chronic illness (HIV) as evidenced by moderate fat depletion, severe muscle depletion.  GOAL:   Patient will meet greater than or equal to 90% of their needs  MONITOR:   Labs, Weight trends, TF tolerance, I & O's, Skin  REASON FOR ASSESSMENT:   Consult Assessment of nutrition requirement/status, Enteral/tube feeding initiation and management  ASSESSMENT:   Pt from [facility] d/t acute metabolic/uremic encephalopathy r/t missing HD and severe sepsis with LLE gangrene. PMH significant for ESRD on HD, HIV, T2DM, seizure disorder  01/26: cortrak placed (tip gastric)  No family present at bedside. Pt unable to make medical decisions at this time. Per Vascular/Ortho pt in need of L AKA.   Per Nephro, last OP HD was 1/16. S/p HD on 1/25 upon admission and plans for another session again today.     Post HD net UF (01/25): 1758ml   EDW 70 kg.  Post HD weight 65.7 kg  Reviewed weight history. Pt is under his EDW. Pt noted to have documented weight of 87.1 kg in June 2023. Uncertain how much weight loss is attributed to true dry weight versus fluid changes.   Medications: biktarvy, SSI 0-9 units q4h, semglee 5 units qhs, medihoney IV drips: D5 in LR @50ml /hr, zosyn, vanc  Labs: BUN 99, Cr 12.69, anion gap 17, phos 6.4,  Mg 2.6, GFR 4  HgbA1c 6.5%, CBG's 92-164 x24 hours  UOP: 22ml  I/O's: +4470ml since admit  NUTRITION - FOCUSED PHYSICAL EXAM:  Flowsheet Row Most Recent Value  Orbital Region Moderate depletion  Upper Arm Region Severe depletion  Thoracic and Lumbar Region Mild depletion  Buccal Region Moderate depletion  Temple Region Moderate depletion  Clavicle Bone Region Mild depletion  Clavicle and Acromion Bone Region Moderate depletion  Scapular Bone Region Moderate depletion  Dorsal Hand Severe depletion  [L>R]  Patellar Region Severe depletion  Anterior Thigh Region Severe depletion  Posterior Calf Region Moderate depletion  Edema (RD Assessment) Mild  [R hand]  Hair Reviewed  Eyes Unable to assess  [sleeping]  Mouth Reviewed  Skin Reviewed  Nails Reviewed       Diet Order:   Diet Order             Diet NPO time specified Except for: Sips with Meds  Diet effective now                   EDUCATION NEEDS:   Not appropriate for education at this time  Skin:  Skin Assessment: Skin Integrity Issues: Skin Integrity Issues:: Unstageable, Stage II, Other (Comment) Stage II: L back Unstageable: R ankle, sacrum, R buttock Other: necrotic L foot  Last BM:  1/26 (type 2-medium)  Height:  Ht Readings from Last 1 Encounters:  10/16/22 6' (1.829 m)   Weight:  Wt Readings from Last 1  Encounters:  10/17/22 68.9 kg   BMI:  Body mass index is 20.6 kg/m.  Estimated Nutritional Needs:   Kcal:  2200-2400  Protein:  115-130g  Fluid:  1L + UOP  Clayborne Dana, RDN, LDN Clinical Nutrition

## 2022-10-17 NOTE — IPAL (Signed)
  Interdisciplinary Goals of Care Family Meeting   Date carried out: 10/17/2022  Location of the meeting: Bedside  Member's involved: Physician, Bedside Registered Nurse, and Family Member or next of kin  Durable Power of Attorney or acting medical decision maker: None designated, daughter next of kin present for discussions  Discussion: We discussed goals of care for [employer] .  Underlying chronic right-sided contractures, ESRD on HD.  Presented with gangrenous foot.  Encephalopathic.  Uncontrolled source of infection.  Likely will need significant amputation to obtain control of infection.  Further reducing already poor quality of life.  Even with aggressive measures, it is possible that that he would die to the infection with underlying comorbid illness increasing this risk.  Daughter states she does not want him to suffer.  Just wants him to be comfortable.  Sounds like he would not want to go through a big surgery.  Based on her description, I recommended comfort measures and described this in detail.  She asked for time to think.  Code status:   Code Status: DNR   Disposition: Continue current acute care  Time spent for the meeting: 10 minutes    Lanier Clam, MD  10/17/2022, 6:16 PM

## 2022-10-17 NOTE — Progress Notes (Addendum)
Friend at bedside around [place] [phone]. Friend stated he has 2 daughters that he does not have contact with. No other family   Man at bedside stating he is the patients brother, Cato Mulligan [phone]. He does not have the patients daughters contact numbers.

## 2022-10-17 NOTE — Progress Notes (Signed)
Pharmacy Antibiotic Note  Richard [name] is a 67 y.o. male admitted on 10/04/2022 with  lower limb wound infection .  Pharmacy has been consulted for Zosyn dosing.  Plan: Zosyn 2.25g Q8H.  Will give 1x vancomycin 750mg  today, patient off typical HD schedule of Tues,Thurs,and Sat. Per nephro plan for run today.  Follow up nephrology plans for hemodialysis.  Follow culture data for de-escalation.  Monitor renal function for dose adjustments as indicated.    Height: 6' (182.9 cm) Weight: 68.9 kg (151 lb 14.4 oz) IBW/kg (Calculated) : 77.6  Temp (24hrs), Avg:98.2 F (36.8 C), Min:96.7 F (35.9 C), Max:99.7 F (37.6 C)  Recent Labs  Lab 10/02/2022 2107 10/16/22 0600 10/16/22 0701 10/16/22 0705 10/16/22 1734 10/17/22 0352  WBC  --  16.1* 15.2*  --   --  12.7*  CREATININE 22.57*  --  12.68*  --   --  12.69*  LATICACIDVEN 2.1*  --   --  1.3 1.4  --     Estimated Creatinine Clearance: 5.7 mL/min (A) (by C-G formula based on SCr of 12.69 mg/dL (H)).    Allergies  Allergen Reactions   Oxycodone Nausea And Vomiting    NOT DOCUMENTED ON MAR    Thank you for allowing pharmacy to be a part of this patient's care.  Esmeralda Arthur, PharmD, BCCCP  10/17/2022 7:58 AM

## 2022-10-17 NOTE — Progress Notes (Addendum)
Richard [name]  Subjective: pt seen in ICU, awakens briefly, not making any conversation, withdrawn  Vitals:   10/17/22 1515 10/17/22 1530 10/17/22 1545 10/17/22 1600  BP: 106/76 107/83 90/68 94/63   Pulse: (!) 110 (!) 110 (!) 114 (!) 110  Resp: 17 18 (!) 22 18  Temp:    97.8 F (36.6 C)  TempSrc:    Axillary  SpO2: 95% 97% 94% 95%  Weight:      Height:        Exam: Gen asleep, not waking up for long w/ stimulation. [place] O2  No jvd or bruits Chest clear bilat to bases RRR no MRG Abd soft ntnd no mass or ascites +bs Ext large black necrotic area of the L foot; no edema Neuro as above    RIJ TDC intact       Home meds include - biktarvy, coreg 6.25 bid, plavix, colace, doxepin, hydralazine 10 tid, insulin glargine-yfgn, keppra, nepro, oxy IR, protonix, crestor, flomax, prns/ vits/ supps      OP HD: [place] TTS 4h  400/800  70kg  2/2 bath  TDC RIJ   Heparin 1600  - needs updating     Assessment/ Plan: L necrotic foot infection - per pmd is getting IV abx AMS - suspect uremia +/- sepsis as cause.  Hyperkalemia - K+ 7.2 in ED pre HD, better now. For HD again today.  ESRD - on HD TTS. Last OP HD was 1/16, missed several sessions. Had HD here 1/24 overnight. Will dialyze again today, and again tomorrow due to several missed OP sessions and to get back on schedule.  HTN/ volume - BP's soft, no need for BP lowering meds. No vol ^ on exam. Well below dry wt, keep even w/ next HD. May benefit from some IVF's.  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 Follow.  HIV  DM2 on insulin GOC - pt has been declining the last several mos and is a shell of himself now. Would support a conservative approach to his medical care including --> DNR, no surgeries. Potentially transition to comfort care as well.    Richard [name] 10/17/2022, 4:12 PM   Recent Labs  Lab 10/09/2022 2107 10/16/22 0600 10/16/22 0701 10/16/22 1734  10/17/22 0352  HGB  --    < > 11.5*  --  10.0*  ALBUMIN 2.7*  --  2.2*  --   --   CALCIUM 10.4*  --  8.9  --  8.7*  PHOS  --   --   --  7.4* 6.4*  CREATININE 22.57*  --  12.68*  --  12.69*  K >7.5*  --  5.3*  --  4.8   < > = values in this interval not displayed.   No results for input(s): "IRON", "TIBC", "FERRITIN" in the last 168 hours. Inpatient medications:  sodium chloride   Intravenous Once   bictegravir-emtricitabine-tenofovir AF  1 tablet Oral Daily   Chlorhexidine Gluconate Cloth  6 each Topical Q0600   feeding supplement (PROSource TF20)  60 mL Per Tube BID   heparin  5,000 Units Subcutaneous Q8H   insulin aspart  0-9 Units Subcutaneous Q4H   insulin glargine-yfgn  5 Units Subcutaneous QHS   leptospermum manuka honey  1 Application Topical Daily   [START ON 10/18/2022] nutrition supplement (JUVEN)  1 packet Per Tube BID BM   sodium zirconium cyclosilicate  10 g Oral Once  thiamine (VITAMIN B1) injection  100 mg Intravenous Daily    sodium chloride     calcium gluconate     dextrose 5% lactated ringers 50 mL/hr at 10/17/22 1300   feeding supplement (OSMOLITE 1.5 CAL)     levETIRAcetam 500 mg (10/17/22 1328)   piperacillin-tazobactam (ZOSYN)  IV Stopped (10/17/22 0856)   [START ON 10/18/2022] vancomycin     vancomycin 750 mg (10/17/22 1610)   sodium chloride, docusate sodium, ondansetron (ZOFRAN) IV, mouth rinse, polyethylene glycol

## 2022-10-17 NOTE — Consult Note (Signed)
[place] Nurse Consult Note: Reason for Consult: multiple wounds Patient with gangrenous right foot and leg wounds, seen by ortho and vascular, non salvageable.  Patient from SNF, AMS, WC bound, HD Wound type: Unstageable sacral and buttock wound Pressure Injury POA: Yes Measurement: see nursing flow sheets Wound bed: sacrum/buttock-90% yellow/10% pink Drainage (amount, consistency, odor) moderate, serous in images  Periwound: intact Dressing procedure/placement/frequency: Medihoney to the sacrum and buttock wounds  Paint right foot and LE wounds with betadine, vascular surgery and ortho have recommended amputation. Needs GOC meeting. No family available.    Re consult if needed, will not follow at this time. Thanks  Yahira Timberman [employer], RN,CWOCN, CNS, [place] [phone])

## 2022-10-17 NOTE — Procedures (Signed)
Cortrak  Person Inserting Tube:  Richard [name] D, RD Tube Type:  Cortrak - 43 inches Tube Size:  10 Tube Location:  Right nare Secured by: Bridle Technique Used to Measure Tube Placement:  Marking at nare/corner of mouth Cortrak Secured At:  63 cm Procedure Comments:  Cortrak Tube Team Note:  Consult received to place a Cortrak feeding tube.   X-ray is required, abdominal x-ray has been ordered by the Cortrak team. Please confirm tube placement before using the Cortrak tube.   If the tube becomes dislodged please keep the tube and contact the Cortrak team at [email or web address] for replacement.  If after hours and replacement cannot be delayed, place a NG tube and confirm placement with an abdominal x-ray.    Richard [name], RD, LDN Clinical Dietitian RD pager # available in [place]  After hours/weekend pager # available in [facility]

## 2022-10-18 DIAGNOSIS — A419 Sepsis, unspecified organism: Secondary | ICD-10-CM | POA: Diagnosis not present

## 2022-10-18 DIAGNOSIS — R6520 Severe sepsis without septic shock: Secondary | ICD-10-CM | POA: Diagnosis not present

## 2022-10-18 DIAGNOSIS — Z515 Encounter for palliative care: Secondary | ICD-10-CM | POA: Diagnosis not present

## 2022-10-18 DIAGNOSIS — Z7189 Other specified counseling: Secondary | ICD-10-CM | POA: Diagnosis not present

## 2022-10-18 LAB — URINALYSIS, ROUTINE W REFLEX MICROSCOPIC
Bacteria, UA: NONE SEEN
Bilirubin Urine: NEGATIVE
Glucose, UA: NEGATIVE mg/dL
Ketones, ur: NEGATIVE mg/dL
Leukocytes,Ua: NEGATIVE
Nitrite: NEGATIVE
Protein, ur: >=300 mg/dL — AB
Specific Gravity, Urine: 1.015 (ref 1.005–1.030)
pH: 5 (ref 5.0–8.0)

## 2022-10-18 LAB — RENAL FUNCTION PANEL
Albumin: 1.9 g/dL — ABNORMAL LOW (ref 3.5–5.0)
Anion gap: 17 — ABNORMAL HIGH (ref 5–15)
BUN: 58 mg/dL — ABNORMAL HIGH (ref 8–23)
CO2: 23 mmol/L (ref 22–32)
Calcium: 8.5 mg/dL — ABNORMAL LOW (ref 8.9–10.3)
Chloride: 95 mmol/L — ABNORMAL LOW (ref 98–111)
Creatinine, Ser: 7.9 mg/dL — ABNORMAL HIGH (ref 0.61–1.24)
GFR, Estimated: 7 mL/min — ABNORMAL LOW (ref >60)
Glucose, Bld: 172 mg/dL — ABNORMAL HIGH (ref 70–99)
Phosphorus: 3.5 mg/dL (ref 2.5–4.6)
Potassium: 3.9 mmol/L (ref 3.5–5.1)
Sodium: 135 mmol/L (ref 135–145)

## 2022-10-18 LAB — CBC
HCT: 30.5 % — ABNORMAL LOW (ref 39.0–52.0)
Hemoglobin: 10.1 g/dL — ABNORMAL LOW (ref 13.0–17.0)
MCH: 30.5 pg (ref 26.0–34.0)
MCHC: 33.1 g/dL (ref 30.0–36.0)
MCV: 92.1 fL (ref 80.0–100.0)
Platelets: 161 10*3/uL (ref 150–400)
RBC: 3.31 MIL/uL — ABNORMAL LOW (ref 4.22–5.81)
RDW: 16.6 % — ABNORMAL HIGH (ref 11.5–15.5)
WBC: 16.2 10*3/uL — ABNORMAL HIGH (ref 4.0–10.5)
nRBC: 0 % (ref 0.0–0.2)

## 2022-10-18 LAB — GLUCOSE, CAPILLARY
Glucose-Capillary: 169 mg/dL — ABNORMAL HIGH (ref 70–99)
Glucose-Capillary: 229 mg/dL — ABNORMAL HIGH (ref 70–99)
Glucose-Capillary: 100 mg/dL — ABNORMAL HIGH (ref 70–99)

## 2022-10-18 LAB — MAGNESIUM: Magnesium: 2.1 mg/dL (ref 1.7–2.4)

## 2022-10-18 MED ORDER — ACETAMINOPHEN 650 MG RE SUPP: 650.0000 mg | Freq: Four times a day (QID) | RECTAL | Status: DC | PRN

## 2022-10-18 MED ORDER — HEPARIN SODIUM (PORCINE) 1000 UNIT/ML DIALYSIS: 1500.0000 [IU] | INTRAMUSCULAR | Status: DC | PRN

## 2022-10-18 MED ORDER — ONDANSETRON HCL 4 MG/2ML IJ SOLN: 4.0000 mg | Freq: Four times a day (QID) | INTRAMUSCULAR | Status: DC | PRN

## 2022-10-18 MED ORDER — DEXTROSE 5 % IV SOLN: INTRAVENOUS | Status: DC

## 2022-10-18 MED ORDER — HYDROMORPHONE HCL-NACL 50-0.9 MG/50ML-% IV SOLN
1.0000 mg/h | INTRAVENOUS | Status: DC
  Administered 2022-10-18: 1 mg/h via INTRAVENOUS
  Administered 2022-10-19 – 2022-10-23 (×5): 2 mg/h via INTRAVENOUS
  Filled 2022-10-18 (×7): qty 50

## 2022-10-18 MED ORDER — BIOTENE DRY MOUTH MT LIQD: 15.0000 mL | OROMUCOSAL | Status: DC | PRN

## 2022-10-18 MED ORDER — LORAZEPAM 2 MG/ML IJ SOLN: 0.5000 mg | INTRAMUSCULAR | Status: DC | PRN

## 2022-10-18 MED ORDER — HYDROMORPHONE BOLUS VIA INFUSION
0.5000 mg | INTRAVENOUS | Status: DC | PRN
  Administered 2022-10-18 – 2022-10-19 (×4): 0.5 mg via INTRAVENOUS

## 2022-10-18 MED ORDER — ACETAMINOPHEN 325 MG PO TABS: 650.0000 mg | ORAL_TABLET | Freq: Four times a day (QID) | ORAL | Status: DC | PRN

## 2022-10-18 MED ORDER — POLYVINYL ALCOHOL 1.4 % OP SOLN: 1.0000 [drp] | Freq: Four times a day (QID) | OPHTHALMIC | Status: DC | PRN

## 2022-10-18 MED ORDER — HEPARIN SODIUM (PORCINE) 1000 UNIT/ML DIALYSIS: 1600.0000 [IU] | Freq: Once | INTRAMUSCULAR | Status: AC

## 2022-10-18 MED ORDER — ONDANSETRON 4 MG PO TBDP: 4.0000 mg | ORAL_TABLET | Freq: Four times a day (QID) | ORAL | Status: DC | PRN

## 2022-10-18 MED ORDER — HEPARIN SODIUM (PORCINE) 1000 UNIT/ML IJ SOLN
INTRAMUSCULAR | Status: AC
  Administered 2022-10-18: 1600 [IU] via INTRAVENOUS_CENTRAL
  Filled 2022-10-18: qty 6

## 2022-10-18 MED ORDER — ACETAMINOPHEN 160 MG/5ML PO SOLN
650.0000 mg | ORAL | Status: DC | PRN
  Administered 2022-10-18: 650 mg
  Filled 2022-10-18: qty 20.3

## 2022-10-18 NOTE — Progress Notes (Signed)
Spoke to daughter over the phone to make her aware patient will be transitioning out of ICU at some point within next 24 hours.

## 2022-10-18 NOTE — Progress Notes (Signed)
  Progress Note    10/18/2022 7:56 AM * No surgery found *  Subjective: Does not have any complaints today currently being connected to dialysis  Vitals:   10/18/22 0600 10/18/22 0732  BP: 98/65   Pulse: (!) 102   Resp: 16   Temp:  (!) 97.3 F (36.3 C)  SpO2: 96%     Physical Exam: He is awake and alert and does communicate responses some of which are unclear mumbled Left foot with dressing in place with dry gangrene of toes  CBC    Component Value Date/Time   WBC 16.2 (H) 10/18/2022 0326   RBC 3.31 (L) 10/18/2022 0326   HGB 10.1 (L) 10/18/2022 0326   HCT 30.5 (L) 10/18/2022 0326   PLT 161 10/18/2022 0326   MCV 92.1 10/18/2022 0326   MCH 30.5 10/18/2022 0326   MCHC 33.1 10/18/2022 0326   RDW 16.6 (H) 10/18/2022 0326   LYMPHSABS 0.9 10/16/2022 0701   MONOABS 1.0 10/16/2022 0701   EOSABS 0.0 10/16/2022 0701   BASOSABS 0.0 10/16/2022 0701    BMET    Component Value Date/Time   NA 135 10/18/2022 0326   K 3.9 10/18/2022 0326   CL 95 (L) 10/18/2022 0326   CO2 23 10/18/2022 0326   GLUCOSE 172 (H) 10/18/2022 0326   BUN 58 (H) 10/18/2022 0326   CREATININE 7.90 (H) 10/18/2022 0326   CALCIUM 8.5 (L) 10/18/2022 0326   CALCIUM 7.7 (L) 01/14/2021 1253   GFRNONAA 7 (L) 10/18/2022 0326   GFRAA 7 (L) 06/02/2020 0835    INR    Component Value Date/Time   INR 1.1 07/23/2022 0208     Intake/Output Summary (Last 24 hours) at 10/18/2022 0756 Last data filed at 10/18/2022 0454 Gross per 24 hour  Intake 1147.29 ml  Output 1050 ml  Net 97.29 ml     Assessment/plan:  67 y.o. male is here with extensive left foot gangrene and will need left above-knee amputation.  Per bedside nurse family will be available today and I will reach out to them either later today or tomorrow to discuss timing of above-knee amputation.  Hopefully patient will be able to participate in this discussion in the coming days.   Richard Dickmann C. Donzetta Matters, MD [facility]  [place] Office: [phone] Pager: [phone]  10/18/2022 7:56 AM

## 2022-10-18 NOTE — Progress Notes (Signed)
   10/18/22 1134  Vitals  Temp (!) 96.4 F (35.8 C)  BP 133/81  Pulse Rate (!) 106  Resp 18  Oxygen Therapy  SpO2 95 %  O2 Device Nasal Cannula  O2 Flow Rate (L/min) 2 L/min  Patient Activity (if Appropriate) In bed  Pulse Oximetry Type Continuous  Oximetry Probe Site Changed No  Post Treatment  Dialyzer Clearance Clear  Duration of HD Treatment -hour(s) 3 hour(s)  Liters Processed 72  Fluid Removed (mL) 600 mL  Tolerated HD Treatment Yes  Post-Hemodialysis Comments tolerated well goal met

## 2022-10-18 NOTE — Progress Notes (Signed)
[facility] Progress Note  Subjective: pt seen in ICU, awake and responding today  Vitals:   10/18/22 0830 10/18/22 0845 10/18/22 0900 10/18/22 0915  BP: (!) 147/80 (!) 151/86 127/77 120/74  Pulse: 95 95 97 (!) 101  Resp: 16 17 17 17   Temp:      TempSrc:      SpO2: 96% 97% 96% 96%  Weight:      Height:        Exam: Gen awake and responding No jvd or bruits Chest clear bilat to bases RRR no MRG Abd soft ntnd no mass or ascites +bs Ext large black necrotic area of the L foot; no edema Neuro as above    RIJ TDC intact       Home meds include - biktarvy, coreg 6.25 bid, plavix, colace, doxepin, hydralazine 10 tid, insulin glargine-yfgn, keppra, nepro, oxy IR, protonix, crestor, flomax, prns/ vits/ supps      OP HD: [place] TTS 4h  400/800  70kg  2/2 bath  TDC RIJ   Heparin 1600  - needs updating     Assessment/ Plan: L necrotic foot infection - per pmd is getting IV abx AMS - suspect uremia +/- sepsis as cause. MS improving w/ serial HD+ abx Hyperkalemia - K+ 7.2 in ED pre HD, better now. For HD again today.  ESRD - on HD TTS. Last OP HD was 1/16, missed several sessions. 3rd HD here in progress today.  HTN/ volume - BP's normal today. No vol ^ on exam. 1.5 kg up today, UF 1 L as tolerated 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 here, get records MBD ckd - CCa in range, phos is a bit high. Follow.  HIV  DM2 on insulin GOC - pt has been declining the last several mos. Pt is waking up today.    Richard [name] 10/18/2022, 9:45 AM   Recent Labs  Lab 10/16/22 0701 10/16/22 1734 10/17/22 0352 10/17/22 1706 10/18/22 0326  HGB 11.5*  --  10.0*  --  10.1*  ALBUMIN 2.2*  --   --   --  1.9*  CALCIUM 8.9  --  8.7*  --  8.5*  PHOS  --    < > 6.4* 2.4* 3.5  CREATININE 12.68*  --  12.69*  --  7.90*  K 5.3*  --  4.8  --  3.9   < > = values in this interval not displayed.    No results for input(s): "IRON", "TIBC", "FERRITIN" in the last 168 hours. Inpatient medications:   sodium chloride   Intravenous Once   bictegravir-emtricitabine-tenofovir AF  1 tablet Oral Daily   Chlorhexidine Gluconate Cloth  6 each Topical Q0600   feeding supplement (PROSource TF20)  60 mL Per Tube BID   heparin  5,000 Units Subcutaneous Q8H   insulin aspart  0-9 Units Subcutaneous Q4H   insulin glargine-yfgn  5 Units Subcutaneous QHS   leptospermum manuka honey  1 Application Topical Daily   nutrition supplement (JUVEN)  1 packet Per Tube BID BM   sodium zirconium cyclosilicate  10 g Oral Once   thiamine (VITAMIN B1) injection  100 mg Intravenous Daily    sodium chloride     calcium gluconate     dextrose 5% lactated ringers Stopped (10/17/22 1441)   feeding supplement (OSMOLITE 1.5 CAL) 20 mL/hr at 10/18/22 0800   levETIRAcetam Stopped (10/18/22 0216)   piperacillin-tazobactam (ZOSYN)  IV Stopped (10/18/22 0549)   vancomycin     sodium chloride,  acetaminophen (TYLENOL) oral liquid 160 mg/5 mL, docusate sodium, [START ON 10/19/2022] heparin, ondansetron (ZOFRAN) IV, mouth rinse, polyethylene glycol

## 2022-10-18 NOTE — Progress Notes (Signed)
NAME:  Richard [name], MRN:  [id number], DOB:  1956/09/12, LOS: 2 ADMISSION DATE:  10/02/2022, CONSULTATION DATE:  10/18/22\ REFERRING MD:  EDP, CHIEF COMPLAINT:  AMS   History of Present Illness:  67 year old man presented from skilled nursing facility with necrotic foot with likely metabolic encephalopathy due to severe sepsis and lack of dialysis with significant uremia.  Patient nearly obtunded on presentation.  Eyes are open.  Protecting airway and will wince with pain.  Initial labs with a potassium of 7.5.  BUN 172 with a creatinine of 22.5.  His anion gap is 29.  He has received no antibiotics.  At time of evaluation his only IV access is his HD catheter.  Unknown time of last dialysis.  Nephrology note indicates last HD 10/07/2021. Attempted to contact [facility] skilled nursing facility multiple times.  It went straight to messaging system.   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 ORTHO consulted: felt likely needed amputation. Vascular surg consulted also felt given degree of infection Left AKA indicated but no family available. Nephro consulted. CVL placed for IV access and septic shock rx. Cefepime and vanc started. Emergent short HD session  1/26: Off pressors.  Remains encephalopathic.  Hemodynamically stable.  Still no family.  Interim History / Subjective:  Found identified just afternoon.  Discussed with daughter at bedside and recommended comfort care given his chronic comorbid disease that is severe at baseline with worsening encephalopathy and septic shock requiring ultimately for source control surgical intervention, leg amputation which would further limit mobility and already  contracted right leg and arm, worsening quality of life.  Large family meeting today.  Comfort measures were agreed upon.  Objective   Blood pressure 120/74, pulse (!) 110, temperature (!) 96.4 F (35.8 C), resp. rate 18, height 6' (1.829 m), weight 71 kg, SpO2 96 %.        Intake/Output Summary (Last 24 hours) at 10/18/2022 1530 Last data filed at 10/18/2022 1400 Gross per 24 hour  Intake 1110.35 ml  Output 1750 ml  Net -639.65 ml    Filed Weights   10/18/22 0443 10/18/22 0806 10/18/22 1134  Weight: 71.5 kg 71.5 kg 71 kg    Examination: General chronically ill-appearing malnourished 67 year old male patient lying in bed currently no acute distress HEENT temporal wasting mucous membranes dry poor dentition no JVD Pulmonary: Clear no accessory use currently on nasal cannula Cardiac: Slightly tachycardic, no murmur rub or gallop Abdomen: Soft nontender Extremities: Cool, the left lower extremity is necrotic and gangrenous.  He has multiple areas of skin breakdown. Neuro: Awake, speech slurred, not oriented.  Contracted on the right side.  Resolved Hospital Problem list   Septic shock resolved 1/26 Hyperkalemia resolved w/ HD Assessment & Plan:  Acute metabolic encephalopathy secondary to metabolic disarray from uremia & complicated by gangrenous left foot and encephalopathy due to severe sepsis, superimposed on prior stroke.   Dilaudid drip for comfort  Septic shock with lactic acidosis due to gangrenous foot BP ok Stopping abx due to comfort measures  Hypovolemia:  No further HD per comfort measures   ESRD (TTS schedule) No further HD per comfort measures  Leukocytosis: Suspect combination of dehydration, volume contraction, sepsis. No further lab checks per comfort  HIV Stop medications  for comfort  Anemia: Stable.  Likely related to ESRD, anemia of chronic illness. No further labs for comfort  Sacral decubitus ulcer: Present on admission Aggressive comfort  medications  DM: No further glucose checks, insulin per comfort  Can transfer out of ICU, remove oxygen, continue comfort medications  Best Practice (right click and "Reselect all SmartList Selections" daily)   Diet/type: tubefeeds DVT prophylaxis: prophylactic heparin  GI prophylaxis: PPI Lines: Central line Foley:  Yes, and it is still needed Code Status:  full code Last date of multidisciplinary goals of care discussion [tbd]   CRITICAL CARE N/a  Lanier Clam, MD [place] for contact info

## 2022-10-18 NOTE — Progress Notes (Addendum)
Palliative Medicine Inpatient Follow Up Note HPI: 67 year old male with end-stage renal disease on hemodialysis who was transferred from skilled care nursing facility with acute metabolic/uremic encephalopathy in the setting of missing hemodialysis and severe sepsis with left lower extremity gangrene.    Palliative care has been asked to get involved for further [place] conversations in the setting of patient having no surrogate decision maker.   Today's Discussion 10/18/2022  *Please note that this is a verbal dictation therefore any spelling or grammatical errors are due to the "[place] One" system interpretation.  Chart reviewed inclusive of vital signs, progress notes, laboratory results, and diagnostic images.   I met with Richard [name]'s daughters, Richard [name], niece Richard [name], brothers Richard [name], and SIL Richard [name], nephew Richard [name].   I was joined by Dr. Melvia [name] and Dr. Silas [name].   We discussed patients current clinical state inclusive of his gangrenous LLE and the option of amputation. Much of the discussion was phrased in the setting of patients quality of life. His brothers acknowledge that he has been dependent for years and had little functional quality.   Patients daughters had not seen him recently though that acknowledge since they did last see him he has declined tremendously.   A formal review of patients PMH was held in the setting of his Type II DM, ESRD, GERD, HIV, HTN, CVAs, seizures.   Patients family was shocked at the severity of his disease. We talked about what his future would look like with an amputation and how it would sadly be even less functional than it is now. Also discussed surgical risk ,post infections, inhibited wound healing.   Created space and opportunity for family to explore thoughts feelings and fears regarding current medical situation.  Patients family are all in agreement that he should be made comfortable.   We talked about transition to  comfort measures in house and what that would entail inclusive of medications to control pain, dyspnea, agitation, nausea, itching, and hiccups.    We discussed stopping all uneccessary measures such as dialysis, cardiac monitoring, blood draws, needle sticks, and frequent vital signs.   Utilized reflective listening throughout our time together.   Patients family concerned about prior people coming in who are not related to patient. A password has been established and list of patients family to allow into the hospital written out.  Questions and concerns addressed/Palliative Support Provided.  ____________________________ Addendum:  Note brought to bedside for patients daughter to be excused from work during this time.  Objective Assessment: Vital Signs Vitals:   10/18/22 0830 10/18/22 0845  BP: (!) 147/80 (!) 151/86  Pulse: 95 95  Resp: 16 17  Temp:    SpO2: 96% 97%    Intake/Output Summary (Last 24 hours) at 10/18/2022 4403 Last data filed at 10/18/2022 0800 Gross per 24 hour  Intake 1149.88 ml  Output 1050 ml  Net 99.88 ml   Last Weight  Most recent update: 10/18/2022  8:09 AM    Weight  71.5 kg (157 lb 10.1 oz)            Gen:  Chronically ill appearing AA M  HEENT: coretrack, dry mucous membranes CV: irregular rate and rhythm  PULM: On 2LPM [place] ABD: soft/nontender EXT: LLE malodorous wrapping in place  Neuro: Opens eyes though nonsensical  SUMMARY OF RECOMMENDATIONS   DNAR/DNI   Plan to transition patient to full comfort measures  Comfort medications per [facility]  Plan to initiate a low dose dilaudid gtt  Additional  Medications per [facility]  Unrestricted visitation of family only (list provided) and PW identified   Ongoing PMT support   Time Spent: 27 Billing based on MDM: High  Problems Addressed: One acute or chronic illness or injury that poses a threat to life or bodily function  Amount and/or Complexity of Data: Category 3:Discussion of management or  test interpretation with external physician/other qualified health care professional/appropriate source (not separately reported)  Risks: Decision regarding hospitalization or escalation of hospital care and Decision not to resuscitate or to de-escalate care because of poor prognosis ______________________________________________________________________________________ [place] Team Team Cell Phone: [phone] Please utilize secure chat with additional questions, if there is no response within 30 minutes please call the above phone number  Palliative Medicine Team providers are available by phone from 7am to 7pm daily and can be reached through the team cell phone.  Should this patient require assistance outside of these hours, please call the patient's attending physician.

## 2022-10-18 NOTE — Progress Notes (Signed)
[facility] Progress Note Patient Name: Richard [name] DOB: 16-Sep-1956 MRN: [id number]   Date of Service  10/18/2022  HPI/Events of Note  Temp 102.4, blood cultures previously obtained.  eICU Interventions  Will send a urinalysis, and order PRN Tylenol.        Kerry Kass [name] 10/18/2022, 3:53 AM

## 2022-10-19 DIAGNOSIS — Z7189 Other specified counseling: Secondary | ICD-10-CM | POA: Diagnosis not present

## 2022-10-19 DIAGNOSIS — A419 Sepsis, unspecified organism: Secondary | ICD-10-CM | POA: Diagnosis not present

## 2022-10-19 DIAGNOSIS — Z515 Encounter for palliative care: Secondary | ICD-10-CM | POA: Diagnosis not present

## 2022-10-19 DIAGNOSIS — R6520 Severe sepsis without septic shock: Secondary | ICD-10-CM | POA: Diagnosis not present

## 2022-10-19 LAB — CULTURE, BLOOD (ROUTINE X 2): Special Requests: ADEQUATE

## 2022-10-19 MED ORDER — GLYCOPYRROLATE 0.2 MG/ML IJ SOLN
0.4000 mg | INTRAMUSCULAR | Status: DC
  Administered 2022-10-19 – 2022-10-24 (×28): 0.4 mg via INTRAVENOUS
  Filled 2022-10-19 (×26): qty 2

## 2022-10-19 MED ORDER — SCOPOLAMINE 1 MG/3DAYS TD PT72
1.0000 | MEDICATED_PATCH | TRANSDERMAL | Status: DC
  Administered 2022-10-19 – 2022-10-22 (×2): 1.5 mg via TRANSDERMAL
  Filled 2022-10-19 (×2): qty 1

## 2022-10-19 NOTE — Progress Notes (Signed)
PROGRESS NOTE    Richard [name]  KDX:[id number] DOB: 08-06-1956 DOA: 10/22/2022 PCP: [facility]  Outpatient Specialists:     Brief Narrative:  Patient is a 67 year old African-American male with history of end-stage renal disease on hemodialysis, CVA with right-sided weakness, HIV on HAART, diabetes mellitus, seizures and hypertension.  Patient is a skilled nursing facility resident.  Patient was admitted with uremia, potassium of 7.5 BUN of 172 and creatinine of 22.5.  Patient has gangrenous foot.  Patient was deemed to have severe sepsis with septic shock.  Patient was managed by ICU team.  After discussion with patient's family, patient was made comfort directed care.  ICU team has transferred patient to the care of hospitalist service.   Assessment & Plan:   Principal Problem:   Severe sepsis ([place])   Acute metabolic encephalopathy secondary to metabolic disarray from uremia & complicated by gangrenous left foot and encephalopathy due to severe sepsis, superimposed on prior stroke.   Dilaudid drip for comfort   Septic shock with lactic acidosis due to gangrenous foot -Comfort directed care.   Hypovolemia:  No further HD per comfort measures     ESRD (TTS schedule) No further HD per comfort measures   Leukocytosis: Suspect combination of dehydration, volume contraction, sepsis. No further lab checks per comfort   HIV Stop medications for comfort   Anemia: Stable.  Likely related to ESRD, anemia of chronic illness. No further labs for comfort   Sacral decubitus ulcer: Present on admission Aggressive comfort medications   DM: No further glucose checks, insulin per comfort     DVT prophylaxis: None Code Status: DNR resuscitate Family Communication:  Disposition Plan: Patient is now comfort directed care   Consultants:  Patient was under ICU care. Palliative care team.  Procedures:  None  Antimicrobials:  None   Subjective: Patient  cannot give any history.  Patient is unresponsive.  Objective: Vitals:   10/18/22 1900 10/18/22 2000 10/19/22 0337 10/19/22 0748  BP:   (!) 91/56 (!) 86/54  Pulse: (!) 114 (!) 116 92 (!) 43  Resp: (!) 8 (!) 6 10 14   Temp:   (!) 100.4 F (38 C) 98.7 F (37.1 C)  TempSrc:   Axillary   SpO2: 91% 92% 92% (!) 43%  Weight:      Height:        Intake/Output Summary (Last 24 hours) at 10/19/2022 1059 Last data filed at 10/18/2022 2000 Gross per 24 hour  Intake 211.3 ml  Output 700 ml  Net -488.7 ml   Filed Weights   10/18/22 0443 10/18/22 0806 10/18/22 1134  Weight: 71.5 kg 71.5 kg 71 kg    Examination:  General exam: Comfortable.  Not in pain. Respiratory system: Clear to auscultation.  Cardiovascular system: S1 & S2 heard. Gastrointestinal system: Abdomen is soft and nontender.  Central nervous system: Nonresponsive. Extremities: Gangrene of the foot.   Data Reviewed: I have personally reviewed following labs and imaging studies  CBC: Recent Labs  Lab 10/16/22 0600 10/16/22 0701 10/17/22 0352 10/18/22 0326  WBC 16.1* 15.2* 12.7* 16.2*  NEUTROABS 14.3* 13.1*  --   --   HGB 12.1* 11.5* 10.0* 10.1*  HCT 37.1* 35.5* 30.5* 30.5*  MCV 93.0 93.4 92.7 92.1  PLT 234 225 178 976   Basic Metabolic Panel: Recent Labs  Lab 09/23/2022 2107 10/16/22 0701 10/16/22 1734 10/17/22 0352 10/17/22 1706 10/18/22 0326  NA 144 137  --  138  --  135  K >7.5* 5.3*  --  4.8  --  3.9  CL 96* 96*  --  100  --  95*  CO2 19* 15*  --  21*  --  23  GLUCOSE 170* 133*  --  179*  --  172*  BUN 172* 91*  --  99*  --  58*  CREATININE 22.57* 12.68*  --  12.69*  --  7.90*  CALCIUM 10.4* 8.9  --  8.7*  --  8.5*  MG  --   --   --  2.6* 2.0 2.1  PHOS  --   --  7.4* 6.4* 2.4* 3.5   GFR: Estimated Creatinine Clearance: 9.4 mL/min (A) (by C-G formula based on SCr of 7.9 mg/dL (H)). Liver Function Tests: Recent Labs  Lab 10/22/2022 2107 10/16/22 0701 10/18/22 0326  AST 23 23  --   ALT 14 15   --   ALKPHOS 119 92  --   BILITOT 0.7 1.3*  --   PROT 7.9 6.9  --   ALBUMIN 2.7* 2.2* 1.9*   Recent Labs  Lab 10/13/2022 2107  LIPASE 42   Recent Labs  Lab 10/02/2022 2107  AMMONIA 35   Coagulation Profile: No results for input(s): "INR", "PROTIME" in the last 168 hours. Cardiac Enzymes: No results for input(s): "CKTOTAL", "CKMB", "CKMBINDEX", "TROPONINI" in the last 168 hours. BNP (last 3 results) No results for input(s): "PROBNP" in the last 8760 hours. HbA1C: No results for input(s): "HGBA1C" in the last 72 hours. CBG: Recent Labs  Lab 10/17/22 1937 10/17/22 2322 10/18/22 0318 10/18/22 0727 10/18/22 1118  GLUCAP 80 98 169* 229* 100*   Lipid Profile: No results for input(s): "CHOL", "HDL", "LDLCALC", "TRIG", "CHOLHDL", "LDLDIRECT" in the last 72 hours. Thyroid Function Tests: No results for input(s): "TSH", "T4TOTAL", "FREET4", "T3FREE", "THYROIDAB" in the last 72 hours. Anemia Panel: No results for input(s): "VITAMINB12", "FOLATE", "FERRITIN", "TIBC", "IRON", "RETICCTPCT" in the last 72 hours. Urine analysis:    Component Value Date/Time   COLORURINE AMBER (A) 10/18/2022 0430   APPEARANCEUR CLOUDY (A) 10/18/2022 0430   LABSPEC 1.015 10/18/2022 0430   PHURINE 5.0 10/18/2022 0430   GLUCOSEU NEGATIVE 10/18/2022 0430   HGBUR SMALL (A) 10/18/2022 0430   BILIRUBINUR NEGATIVE 10/18/2022 0430   KETONESUR NEGATIVE 10/18/2022 0430   PROTEINUR >=300 (A) 10/18/2022 0430   UROBILINOGEN 0.2 11/29/2014 2003   NITRITE NEGATIVE 10/18/2022 0430   LEUKOCYTESUR NEGATIVE 10/18/2022 0430   Sepsis Labs: @LABRCNTIP (procalcitonin:4,lacticidven:4)  ) Recent Results (from the past 240 hour(s))  Resp panel by RT-PCR (RSV, Flu A&B, Covid) Anterior Nasal Swab     Status: None   Collection Time: 10/11/2022  6:53 PM   Specimen: Anterior Nasal Swab  Result Value Ref Range Status   SARS Coronavirus 2 by RT PCR NEGATIVE NEGATIVE Final    Comment: (NOTE) SARS-CoV-2 target nucleic acids  are NOT DETECTED.  The SARS-CoV-2 RNA is generally detectable in upper respiratory specimens during the acute phase of infection. The lowest concentration of SARS-CoV-2 viral copies this assay can detect is 138 copies/mL. A negative result does not preclude SARS-Cov-2 infection and should not be used as the sole basis for treatment or other patient management decisions. A negative result may occur with  improper specimen collection/handling, submission of specimen other than nasopharyngeal swab, presence of viral mutation(s) within the areas targeted by this assay, and inadequate number of viral copies(<138 copies/mL). A negative result must be combined with clinical observations, patient history, and epidemiological information. The expected result is Negative.  Fact Sheet  for Patients:  EntrepreneurPulse.com.au  Fact Sheet for Healthcare Providers:  IncredibleEmployment.be  This test is no t yet approved or cleared by the [place] FDA and  has been authorized for detection and/or diagnosis of SARS-CoV-2 by FDA under an Emergency Use Authorization (EUA). This EUA will remain  in effect (meaning this test can be used) for the duration of the COVID-19 declaration under Section 564(b)(1) of the Act, 21 U.S.C.section 360bbb-3(b)(1), unless the authorization is terminated  or revoked sooner.       Influenza A by PCR NEGATIVE NEGATIVE Final   Influenza B by PCR NEGATIVE NEGATIVE Final    Comment: (NOTE) The Xpert Xpress SARS-CoV-2/FLU/RSV plus assay is intended as an aid in the diagnosis of influenza from Nasopharyngeal swab specimens and should not be used as a sole basis for treatment. Nasal washings and aspirates are unacceptable for Xpert Xpress SARS-CoV-2/FLU/RSV testing.  Fact Sheet for Patients: EntrepreneurPulse.com.au  Fact Sheet for Healthcare Providers: IncredibleEmployment.be  This test is  not yet approved or cleared by the [place] FDA and has been authorized for detection and/or diagnosis of SARS-CoV-2 by FDA under an Emergency Use Authorization (EUA). This EUA will remain in effect (meaning this test can be used) for the duration of the COVID-19 declaration under Section 564(b)(1) of the Act, 21 U.S.C. section 360bbb-3(b)(1), unless the authorization is terminated or revoked.     Resp Syncytial Virus by PCR NEGATIVE NEGATIVE Final    Comment: (NOTE) Fact Sheet for Patients: EntrepreneurPulse.com.au  Fact Sheet for Healthcare Providers: IncredibleEmployment.be  This test is not yet approved or cleared by the [place] FDA and has been authorized for detection and/or diagnosis of SARS-CoV-2 by FDA under an Emergency Use Authorization (EUA). This EUA will remain in effect (meaning this test can be used) for the duration of the COVID-19 declaration under Section 564(b)(1) of the Act, 21 U.S.C. section 360bbb-3(b)(1), unless the authorization is terminated or revoked.  Performed at [facility], [place] [address], [place], [place] [address]   Blood culture (routine x 2)     Status: None (Preliminary result)   Collection Time: 10/16/22  7:05 AM   Specimen: BLOOD  Result Value Ref Range Status   Specimen Description BLOOD FEMORAL ARTERY  Final   Special Requests   Final    BOTTLES DRAWN AEROBIC ONLY Blood Culture adequate volume   Culture   Final    NO GROWTH 3 DAYS Performed at [facility], [place] [address], [place], [place] [address]    Report Status PENDING  Incomplete  Blood culture (routine x 2)     Status: Abnormal   Collection Time: 10/16/22  7:10 AM   Specimen: BLOOD  Result Value Ref Range Status   Specimen Description BLOOD LEFT ANTECUBITAL  Final   Special Requests   Final    BOTTLES DRAWN AEROBIC AND ANAEROBIC Blood Culture adequate volume   Culture  Setup Time   Final    GRAM POSITIVE COCCI IN  CLUSTERS ANAEROBIC BOTTLE ONLY CRITICAL RESULT CALLED TO, READ BACK BY AND VERIFIED WITH: PHARMD AUSTIN PAYTES [id number] BY JRS    Culture (A)  Final    STAPHYLOCOCCUS EPIDERMIDIS THE SIGNIFICANCE OF ISOLATING THIS ORGANISM FROM A SINGLE SET OF BLOOD CULTURES WHEN MULTIPLE SETS ARE DRAWN IS UNCERTAIN. PLEASE NOTIFY THE MICROBIOLOGY DEPARTMENT WITHIN ONE WEEK IF SPECIATION AND SENSITIVITIES ARE REQUIRED. Performed at [facility], [place] [address], [place], [place] [address]    Report Status  10/19/2022 FINAL  Final  Blood Culture ID Panel (Reflexed)     Status: Abnormal   Collection Time: 10/16/22  7:10 AM  Result Value Ref Range Status   Enterococcus faecalis NOT DETECTED NOT DETECTED Final   Enterococcus Faecium NOT DETECTED NOT DETECTED Final   Listeria monocytogenes NOT DETECTED NOT DETECTED Final   Staphylococcus species DETECTED (A) NOT DETECTED Final    Comment: CRITICAL RESULT CALLED TO, READ BACK BY AND VERIFIED WITH: PHARMD AUTIN PAYTES [id number] BY JRS    Staphylococcus aureus (BCID) NOT DETECTED NOT DETECTED Final   Staphylococcus epidermidis DETECTED (A) NOT DETECTED Final    Comment: Methicillin (oxacillin) resistant coagulase negative staphylococcus. Possible blood culture contaminant (unless isolated from more than one blood culture draw or clinical case suggests pathogenicity). No antibiotic treatment is indicated for blood  culture contaminants.    Staphylococcus lugdunensis NOT DETECTED NOT DETECTED Final   Streptococcus species NOT DETECTED NOT DETECTED Final   Streptococcus agalactiae NOT DETECTED NOT DETECTED Final   Streptococcus pneumoniae NOT DETECTED NOT DETECTED Final   Streptococcus pyogenes NOT DETECTED NOT DETECTED Final   A.calcoaceticus-baumannii NOT DETECTED NOT DETECTED Final   Bacteroides fragilis NOT DETECTED NOT DETECTED Final   Enterobacterales NOT DETECTED NOT DETECTED Final   Enterobacter cloacae complex NOT DETECTED NOT DETECTED Final    Escherichia coli NOT DETECTED NOT DETECTED Final   Klebsiella aerogenes NOT DETECTED NOT DETECTED Final   Klebsiella oxytoca NOT DETECTED NOT DETECTED Final   Klebsiella pneumoniae NOT DETECTED NOT DETECTED Final   Proteus species NOT DETECTED NOT DETECTED Final   Salmonella species NOT DETECTED NOT DETECTED Final   Serratia marcescens NOT DETECTED NOT DETECTED Final   Haemophilus influenzae NOT DETECTED NOT DETECTED Final   Neisseria meningitidis NOT DETECTED NOT DETECTED Final   Pseudomonas aeruginosa NOT DETECTED NOT DETECTED Final   Stenotrophomonas maltophilia NOT DETECTED NOT DETECTED Final   Candida albicans NOT DETECTED NOT DETECTED Final   Candida auris NOT DETECTED NOT DETECTED Final   Candida glabrata NOT DETECTED NOT DETECTED Final   Candida krusei NOT DETECTED NOT DETECTED Final   Candida parapsilosis NOT DETECTED NOT DETECTED Final   Candida tropicalis NOT DETECTED NOT DETECTED Final   Cryptococcus neoformans/gattii NOT DETECTED NOT DETECTED Final   Methicillin resistance mecA/C DETECTED (A) NOT DETECTED Final    Comment: CRITICAL RESULT CALLED TO, READ BACK BY AND VERIFIED WITH: PHARMD AUSTIN PAYTES [id number] BY JRS Performed at [facility], [address], [place], [place] [address]   MRSA Next Gen by PCR, Nasal     Status: None   Collection Time: 10/16/22  1:30 PM   Specimen: Nasal Mucosa; Nasal Swab  Result Value Ref Range Status   MRSA by PCR Next Gen NOT DETECTED NOT DETECTED Final    Comment: (NOTE) The GeneXpert MRSA Assay (FDA approved for NASAL specimens only), is one component of a comprehensive MRSA colonization surveillance program. It is not intended to diagnose MRSA infection nor to guide or monitor treatment for MRSA infections. Test performance is not FDA approved in patients less than 91 years old. Performed at [facility], [place] [address] [place], [place] [address]          Radiology Studies: No results  found.      Scheduled Meds:  sodium chloride   Intravenous Once   glycopyrrolate  0.4 mg Intravenous Q4H   scopolamine  1 patch Transdermal Q72H   Continuous Infusions:  sodium chloride  calcium gluconate     HYDROmorphone 2 mg/hr (10/19/22 0833)     LOS: 3 days    Time spent: 55 minutes.    Dana Allan, MD  [facility] Pager #: [phone] 7PM-7AM contact night coverage as above

## 2022-10-19 NOTE — Progress Notes (Signed)
   Palliative Medicine Inpatient Follow Up Note HPI: 67 year old male with end-stage renal disease on hemodialysis who was transferred from skilled care nursing facility with acute metabolic/uremic encephalopathy in the setting of missing hemodialysis and severe sepsis with left lower extremity gangrene.    Palliative care has been asked to get involved for further [place] conversations in the setting of patient having no surrogate decision maker.   Today's Discussion 10/19/2022  *Please note that this is a verbal dictation therefore any spelling or grammatical errors are due to the "[place] One" system interpretation.  Chart reviewed inclusive of vital signs, progress notes, laboratory results, and diagnostic images.   I met at bedside this morning with patient, he is somnolent and breathing more heavily with notable secretion burden. Distal digits are cool to the touch.   I spoke to patients RN, Theda Sers. We repositioned the patient. Discussed a bolus of dilaudid and increase of gtt rate. Reviewed adding a scopolamine patch as well as glycopyrrolate every 4 hours.   No family at bedside this morning.   Objective Assessment: Vital Signs Vitals:   10/19/22 0337 10/19/22 0748  BP: (!) 91/56 (!) 86/54  Pulse: 92 (!) 43  Resp: 10 14  Temp: (!) 100.4 F (38 C) 98.7 F (37.1 C)  SpO2: 92% (!) 43%    Intake/Output Summary (Last 24 hours) at 10/19/2022 0211 Last data filed at 10/18/2022 2000 Gross per 24 hour  Intake 331.3 ml  Output 700 ml  Net -368.7 ml    Last Weight  Most recent update: 10/18/2022 11:36 AM    Weight  71 kg (156 lb 8.4 oz)            Gen:  Chronically ill appearing AA M  HEENT: coretrack, dry mucous membranes CV: irregular rate and rhythm  PULM: On 2LPM [place] ABD: soft/nontender EXT: LLE malodorous wrapping in place  Neuro: Opens eyes though nonsensical  SUMMARY OF RECOMMENDATIONS   DNAR/DNI   Continue full comfort measures  Comfort medications per  MAR  Continue dilaudid gtt  Start glycopyrrolate 0.4mg  Q4H ATC, added a scopolamine patch  Additional Medications per [facility]  Unrestricted visitation of family only (list provided) and PW identified   Ongoing PMT support  Anticipate in hospital death w/in hours to 2 days  Billing based on MDM: High ______________________________________________________________________________________ [facility] Team Team Cell Phone: [phone] Please utilize secure chat with additional questions, if there is no response within 30 minutes please call the above phone number  Palliative Medicine Team providers are available by phone from 7am to 7pm daily and can be reached through the team cell phone.  Should this patient require assistance outside of these hours, please call the patient's attending physician.

## 2022-10-20 DIAGNOSIS — I169 Hypertensive crisis, unspecified: Secondary | ICD-10-CM | POA: Diagnosis not present

## 2022-10-20 DIAGNOSIS — N186 End stage renal disease: Secondary | ICD-10-CM | POA: Diagnosis not present

## 2022-10-20 DIAGNOSIS — N400 Enlarged prostate without lower urinary tract symptoms: Secondary | ICD-10-CM | POA: Diagnosis not present

## 2022-10-20 DIAGNOSIS — E785 Hyperlipidemia, unspecified: Secondary | ICD-10-CM | POA: Diagnosis not present

## 2022-10-20 DIAGNOSIS — E119 Type 2 diabetes mellitus without complications: Secondary | ICD-10-CM | POA: Diagnosis not present

## 2022-10-20 DIAGNOSIS — I10 Essential (primary) hypertension: Secondary | ICD-10-CM | POA: Diagnosis not present

## 2022-10-20 DIAGNOSIS — A419 Sepsis, unspecified organism: Secondary | ICD-10-CM | POA: Diagnosis not present

## 2022-10-20 DIAGNOSIS — E559 Vitamin D deficiency, unspecified: Secondary | ICD-10-CM | POA: Diagnosis not present

## 2022-10-20 DIAGNOSIS — R6520 Severe sepsis without septic shock: Secondary | ICD-10-CM | POA: Diagnosis not present

## 2022-10-20 NOTE — Progress Notes (Signed)
PROGRESS NOTE    Richard [name]  WCB:[id number] DOB: 10-27-1955 DOA: 09/22/2022 PCP: [facility]   Brief Narrative:   67 year old African-American male with history of end-stage renal disease on hemodialysis, CVA with right-sided weakness, HIV on HAART, diabetes mellitus, seizures and hypertension.  Patient is a skilled nursing facility resident.  Patient was admitted with uremia, potassium of 7.5 BUN of 172 and creatinine of 22.5.  Patient has gangrenous foot.  Patient was deemed to have severe sepsis with septic shock.  Patient was managed by ICU team.  After discussion with patient's family, patient was made comfort directed care.  ICU team has transferred patient to the care of hospitalist service.   Assessment & Plan:  Principal Problem:   Severe sepsis ([place])    Acute metabolic encephalopathy secondary to metabolic disarray from uremia & complicated by gangrenous left foot and encephalopathy due to severe sepsis, superimposed on prior stroke.   Dilaudid drip for comfort   Septic shock with lactic acidosis due to gangrenous foot -Comfort directed care.   Hypovolemia:  No further HD per comfort measures     ESRD (TTS schedule) No further HD per comfort measures   Leukocytosis: Suspect combination of dehydration, volume contraction, sepsis. No further lab checks per comfort   HIV Stop medications for comfort   Anemia: Stable.  Likely related to ESRD, anemia of chronic illness. No further labs for comfort   Sacral decubitus ulcer: Present on admission Aggressive comfort medications   DM: No further glucose checks, insulin per comfort      DVT prophylaxis: None Code Status: DNR Family Communication: Friend at bedside  Status is: Inpatient Patient is now comfort care.  At this time anticipate in-hospital death   Signs/Symptoms: moderate fat depletion, severe muscle depletion  Interventions: Refer to RD note for recommendations  Body mass  index is 21.23 kg/m.  Pressure Injury 10/16/22 Ankle Anterior;Right Unstageable - Full thickness tissue loss in which the base of the injury is covered by slough (yellow, tan, gray, green or brown) and/or eschar (tan, brown or black) in the wound bed. eschar (Active)  10/16/22 1358  Location: Ankle  Location Orientation: Anterior;Right  Staging: Unstageable - Full thickness tissue loss in which the base of the injury is covered by slough (yellow, tan, gray, green or brown) and/or eschar (tan, brown or black) in the wound bed.  Wound Description (Comments): eschar  Present on Admission: Yes     Pressure Injury 10/16/22 Knee Anterior;Right (Active)  10/16/22 1359  Location: Knee  Location Orientation: Anterior;Right  Staging:   Wound Description (Comments):   Present on Admission: Yes     Pressure Injury 10/16/22 Sacrum Medial Unstageable - Full thickness tissue loss in which the base of the injury is covered by slough (yellow, tan, gray, green or brown) and/or eschar (tan, brown or black) in the wound bed. Yellow, white, pink 1/26 WOC up (Active)  10/16/22 1359  Location: Sacrum  Location Orientation: Medial  Staging: Unstageable - Full thickness tissue loss in which the base of the injury is covered by slough (yellow, tan, gray, green or brown) and/or eschar (tan, brown or black) in the wound bed.  Wound Description (Comments): Yellow, white, pink 1/26 WOC updated to Unstagable because base of wound is cover with slough  Present on Admission: Yes     Pressure Injury 10/16/22 Buttocks Right Unstageable - Full thickness tissue loss in which the base of the injury is covered by slough (yellow, tan, gray, green or  brown) and/or eschar (tan, brown or black) in the wound bed. eschar (Active)  10/16/22 1401  Location: Buttocks  Location Orientation: Right  Staging: Unstageable - Full thickness tissue loss in which the base of the injury is covered by slough (yellow, tan, gray, green or brown)  and/or eschar (tan, brown or black) in the wound bed.  Wound Description (Comments): eschar  Present on Admission:      Pressure Injury 10/16/22 Back Left Stage 3 -  Full thickness tissue loss. Subcutaneous fat may be visible but bone, tendon or muscle are NOT exposed. pink (Active)  10/16/22 1403  Location: Back  Location Orientation: Left  Staging: Stage 3 -  Full thickness tissue loss. Subcutaneous fat may be visible but bone, tendon or muscle are NOT exposed.  Wound Description (Comments): pink  Present on Admission: Yes        Subjective: Currently remains sedated on Dilaudid drip.  Overall appears comfortable   Examination:  General exam: Appears calm  Respiratory system: Clear to auscultation. Respiratory effort normal. Cardiovascular system: S1 & S2 heard, RRR. No JVD, murmurs, rubs, gallops or clicks. No pedal edema. Gastrointestinal system: Abdomen is nondistended, soft and nontender. No organomegaly or masses felt. Normal bowel sounds heard. Central nervous system: Unable to assess Extremities: Unable to assess Skin: No rashes, lesions or ulcers Psychiatry: Unable to assess  Objective: Vitals:   10/18/22 1900 10/18/22 2000 10/19/22 0337 10/19/22 0748  BP:   (!) 91/56 (!) 86/54  Pulse: (!) 114 (!) 116 92 (!) 43  Resp: (!) 8 (!) 6 10 14   Temp:   (!) 100.4 F (38 C) 98.7 F (37.1 C)  TempSrc:   Axillary   SpO2: 91% 92% 92% (!) 43%  Weight:      Height:       No intake or output data in the 24 hours ending 10/20/22 0743 Filed Weights   10/18/22 0443 10/18/22 0806 10/18/22 1134  Weight: 71.5 kg 71.5 kg 71 kg     Data Reviewed:   CBC: Recent Labs  Lab 10/16/22 0600 10/16/22 0701 10/17/22 0352 10/18/22 0326  WBC 16.1* 15.2* 12.7* 16.2*  NEUTROABS 14.3* 13.1*  --   --   HGB 12.1* 11.5* 10.0* 10.1*  HCT 37.1* 35.5* 30.5* 30.5*  MCV 93.0 93.4 92.7 92.1  PLT 234 225 178 094   Basic Metabolic Panel: Recent Labs  Lab 10/06/2022 2107 10/16/22 0701  10/16/22 1734 10/17/22 0352 10/17/22 1706 10/18/22 0326  NA 144 137  --  138  --  135  K >7.5* 5.3*  --  4.8  --  3.9  CL 96* 96*  --  100  --  95*  CO2 19* 15*  --  21*  --  23  GLUCOSE 170* 133*  --  179*  --  172*  BUN 172* 91*  --  99*  --  58*  CREATININE 22.57* 12.68*  --  12.69*  --  7.90*  CALCIUM 10.4* 8.9  --  8.7*  --  8.5*  MG  --   --   --  2.6* 2.0 2.1  PHOS  --   --  7.4* 6.4* 2.4* 3.5   GFR: Estimated Creatinine Clearance: 9.4 mL/min (A) (by C-G formula based on SCr of 7.9 mg/dL (H)). Liver Function Tests: Recent Labs  Lab 09/27/2022 2107 10/16/22 0701 10/18/22 0326  AST 23 23  --   ALT 14 15  --   ALKPHOS 119 92  --  BILITOT 0.7 1.3*  --   PROT 7.9 6.9  --   ALBUMIN 2.7* 2.2* 1.9*   Recent Labs  Lab 10/14/2022 2107  LIPASE 42   Recent Labs  Lab 09/24/2022 2107  AMMONIA 35   Coagulation Profile: No results for input(s): "INR", "PROTIME" in the last 168 hours. Cardiac Enzymes: No results for input(s): "CKTOTAL", "CKMB", "CKMBINDEX", "TROPONINI" in the last 168 hours. BNP (last 3 results) No results for input(s): "PROBNP" in the last 8760 hours. HbA1C: No results for input(s): "HGBA1C" in the last 72 hours. CBG: Recent Labs  Lab 10/17/22 1937 10/17/22 2322 10/18/22 0318 10/18/22 0727 10/18/22 1118  GLUCAP 80 98 169* 229* 100*   Lipid Profile: No results for input(s): "CHOL", "HDL", "LDLCALC", "TRIG", "CHOLHDL", "LDLDIRECT" in the last 72 hours. Thyroid Function Tests: No results for input(s): "TSH", "T4TOTAL", "FREET4", "T3FREE", "THYROIDAB" in the last 72 hours. Anemia Panel: No results for input(s): "VITAMINB12", "FOLATE", "FERRITIN", "TIBC", "IRON", "RETICCTPCT" in the last 72 hours. Sepsis Labs: Recent Labs  Lab 10/19/2022 2107 10/16/22 0705 10/16/22 1734 10/17/22 0830  LATICACIDVEN 2.1* 1.3 1.4 1.1    Recent Results (from the past 240 hour(s))  Resp panel by RT-PCR (RSV, Flu A&B, Covid) Anterior Nasal Swab     Status: None    Collection Time: 10/13/2022  6:53 PM   Specimen: Anterior Nasal Swab  Result Value Ref Range Status   SARS Coronavirus 2 by RT PCR NEGATIVE NEGATIVE Final    Comment: (NOTE) SARS-CoV-2 target nucleic acids are NOT DETECTED.  The SARS-CoV-2 RNA is generally detectable in upper respiratory specimens during the acute phase of infection. The lowest concentration of SARS-CoV-2 viral copies this assay can detect is 138 copies/mL. A negative result does not preclude SARS-Cov-2 infection and should not be used as the sole basis for treatment or other patient management decisions. A negative result may occur with  improper specimen collection/handling, submission of specimen other than nasopharyngeal swab, presence of viral mutation(s) within the areas targeted by this assay, and inadequate number of viral copies(<138 copies/mL). A negative result must be combined with clinical observations, patient history, and epidemiological information. The expected result is Negative.  Fact Sheet for Patients:  EntrepreneurPulse.com.au  Fact Sheet for Healthcare Providers:  IncredibleEmployment.be  This test is no t yet approved or cleared by the [place] FDA and  has been authorized for detection and/or diagnosis of SARS-CoV-2 by FDA under an Emergency Use Authorization (EUA). This EUA will remain  in effect (meaning this test can be used) for the duration of the COVID-19 declaration under Section 564(b)(1) of the Act, 21 U.S.C.section 360bbb-3(b)(1), unless the authorization is terminated  or revoked sooner.       Influenza A by PCR NEGATIVE NEGATIVE Final   Influenza B by PCR NEGATIVE NEGATIVE Final    Comment: (NOTE) The Xpert Xpress SARS-CoV-2/FLU/RSV plus assay is intended as an aid in the diagnosis of influenza from Nasopharyngeal swab specimens and should not be used as a sole basis for treatment. Nasal washings and aspirates are unacceptable for  Xpert Xpress SARS-CoV-2/FLU/RSV testing.  Fact Sheet for Patients: EntrepreneurPulse.com.au  Fact Sheet for Healthcare Providers: IncredibleEmployment.be  This test is not yet approved or cleared by the [place] FDA and has been authorized for detection and/or diagnosis of SARS-CoV-2 by FDA under an Emergency Use Authorization (EUA). This EUA will remain in effect (meaning this test can be used) for the duration of the COVID-19 declaration under Section 564(b)(1) of the Act, 21 U.S.C.  section 360bbb-3(b)(1), unless the authorization is terminated or revoked.     Resp Syncytial Virus by PCR NEGATIVE NEGATIVE Final    Comment: (NOTE) Fact Sheet for Patients: EntrepreneurPulse.com.au  Fact Sheet for Healthcare Providers: IncredibleEmployment.be  This test is not yet approved or cleared by the [place] FDA and has been authorized for detection and/or diagnosis of SARS-CoV-2 by FDA under an Emergency Use Authorization (EUA). This EUA will remain in effect (meaning this test can be used) for the duration of the COVID-19 declaration under Section 564(b)(1) of the Act, 21 U.S.C. section 360bbb-3(b)(1), unless the authorization is terminated or revoked.  Performed at [facility], [place] [address] [place], [place] [address]   Blood culture (routine x 2)     Status: None (Preliminary result)   Collection Time: 10/16/22  7:05 AM   Specimen: BLOOD  Result Value Ref Range Status   Specimen Description BLOOD FEMORAL ARTERY  Final   Special Requests   Final    BOTTLES DRAWN AEROBIC ONLY Blood Culture adequate volume   Culture   Final    NO GROWTH 4 DAYS Performed at [facility], [place] [address], [place], [place] [address]    Report Status PENDING  Incomplete  Blood culture (routine x 2)     Status: Abnormal   Collection Time: 10/16/22  7:10 AM   Specimen: BLOOD  Result Value Ref Range  Status   Specimen Description BLOOD LEFT ANTECUBITAL  Final   Special Requests   Final    BOTTLES DRAWN AEROBIC AND ANAEROBIC Blood Culture adequate volume   Culture  Setup Time   Final    GRAM POSITIVE COCCI IN CLUSTERS ANAEROBIC BOTTLE ONLY CRITICAL RESULT CALLED TO, READ BACK BY AND VERIFIED WITH: PHARMD AUSTIN PAYTES [id number] BY JRS    Culture (A)  Final    STAPHYLOCOCCUS EPIDERMIDIS THE SIGNIFICANCE OF ISOLATING THIS ORGANISM FROM A SINGLE SET OF BLOOD CULTURES WHEN MULTIPLE SETS ARE DRAWN IS UNCERTAIN. PLEASE NOTIFY THE MICROBIOLOGY DEPARTMENT WITHIN ONE WEEK IF SPECIATION AND SENSITIVITIES ARE REQUIRED. Performed at [facility], [place] [address], [place], [place] [address]    Report Status 10/19/2022 FINAL  Final  Blood Culture ID Panel (Reflexed)     Status: Abnormal   Collection Time: 10/16/22  7:10 AM  Result Value Ref Range Status   Enterococcus faecalis NOT DETECTED NOT DETECTED Final   Enterococcus Faecium NOT DETECTED NOT DETECTED Final   Listeria monocytogenes NOT DETECTED NOT DETECTED Final   Staphylococcus species DETECTED (A) NOT DETECTED Final    Comment: CRITICAL RESULT CALLED TO, READ BACK BY AND VERIFIED WITH: PHARMD AUTIN PAYTES [id number] BY JRS    Staphylococcus aureus (BCID) NOT DETECTED NOT DETECTED Final   Staphylococcus epidermidis DETECTED (A) NOT DETECTED Final    Comment: Methicillin (oxacillin) resistant coagulase negative staphylococcus. Possible blood culture contaminant (unless isolated from more than one blood culture draw or clinical case suggests pathogenicity). No antibiotic treatment is indicated for blood  culture contaminants.    Staphylococcus lugdunensis NOT DETECTED NOT DETECTED Final   Streptococcus species NOT DETECTED NOT DETECTED Final   Streptococcus agalactiae NOT DETECTED NOT DETECTED Final   Streptococcus pneumoniae NOT DETECTED NOT DETECTED Final   Streptococcus pyogenes NOT DETECTED NOT DETECTED Final    A.calcoaceticus-baumannii NOT DETECTED NOT DETECTED Final   Bacteroides fragilis NOT DETECTED NOT DETECTED Final   Enterobacterales NOT DETECTED NOT DETECTED Final   Enterobacter cloacae complex NOT DETECTED NOT DETECTED  Final   Escherichia coli NOT DETECTED NOT DETECTED Final   Klebsiella aerogenes NOT DETECTED NOT DETECTED Final   Klebsiella oxytoca NOT DETECTED NOT DETECTED Final   Klebsiella pneumoniae NOT DETECTED NOT DETECTED Final   Proteus species NOT DETECTED NOT DETECTED Final   Salmonella species NOT DETECTED NOT DETECTED Final   Serratia marcescens NOT DETECTED NOT DETECTED Final   Haemophilus influenzae NOT DETECTED NOT DETECTED Final   Neisseria meningitidis NOT DETECTED NOT DETECTED Final   Pseudomonas aeruginosa NOT DETECTED NOT DETECTED Final   Stenotrophomonas maltophilia NOT DETECTED NOT DETECTED Final   Candida albicans NOT DETECTED NOT DETECTED Final   Candida auris NOT DETECTED NOT DETECTED Final   Candida glabrata NOT DETECTED NOT DETECTED Final   Candida krusei NOT DETECTED NOT DETECTED Final   Candida parapsilosis NOT DETECTED NOT DETECTED Final   Candida tropicalis NOT DETECTED NOT DETECTED Final   Cryptococcus neoformans/gattii NOT DETECTED NOT DETECTED Final   Methicillin resistance mecA/C DETECTED (A) NOT DETECTED Final    Comment: CRITICAL RESULT CALLED TO, READ BACK BY AND VERIFIED WITH: PHARMD AUSTIN PAYTES [id number] BY JRS Performed at [facility], [place] [address], [place], [place] [address]   MRSA Next Gen by PCR, Nasal     Status: None   Collection Time: 10/16/22  1:30 PM   Specimen: Nasal Mucosa; Nasal Swab  Result Value Ref Range Status   MRSA by PCR Next Gen NOT DETECTED NOT DETECTED Final    Comment: (NOTE) The GeneXpert MRSA Assay (FDA approved for NASAL specimens only), is one component of a comprehensive MRSA colonization surveillance program. It is not intended to diagnose MRSA infection nor to guide or monitor treatment for MRSA  infections. Test performance is not FDA approved in patients less than 72 years old. Performed at [facility], [place] [address], [place], [place] [address]          Radiology Studies: No results found.      Scheduled Meds:  sodium chloride   Intravenous Once   glycopyrrolate  0.4 mg Intravenous Q4H   scopolamine  1 patch Transdermal Q72H   Continuous Infusions:  sodium chloride     calcium gluconate     HYDROmorphone 2 mg/hr (10/19/22 0833)     LOS: 4 days   Time spent= 35 mins    Ike Maragh Arsenio Loader, MD [facility]  If 7PM-7AM, please contact night-coverage  10/20/2022, 7:43 AM

## 2022-10-20 NOTE — Progress Notes (Signed)
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  Previously discussed the foot is not salvageable and offered the patient an above-knee amputation.  Noted transition to comfort care over the weekend with now DNR/DNI.  Vascular surgery will sign off.  Please call if we can be of further assistance.  Marty Heck, MD [facility] Office: [place]

## 2022-10-20 NOTE — Progress Notes (Signed)
Bedside symptom management check.  Patient in bed, appears asleep, no acute distress, RR at 12.  No grimaces or movement.  Nurse reports that patient has been asleep and comfortable at the 2mg /hr dilaudid gtt.  Additional symptom management meds ordered if needed for anxiety or agitation.  Patient has not required anything in addition to the dialudid.  Will follow  Kizzie Fantasia, MSN, RN-BC, [facility], [facility] Palliative Clinical Specialist [facility]

## 2022-10-21 DIAGNOSIS — R6520 Severe sepsis without septic shock: Secondary | ICD-10-CM | POA: Diagnosis not present

## 2022-10-21 DIAGNOSIS — A419 Sepsis, unspecified organism: Secondary | ICD-10-CM | POA: Diagnosis not present

## 2022-10-21 LAB — CULTURE, BLOOD (ROUTINE X 2)
Culture: NO GROWTH
Special Requests: ADEQUATE

## 2022-10-21 NOTE — Progress Notes (Signed)
Nutrition Brief Note  Chart reviewed. Pt now on comfort care.  No further nutrition interventions planned at this time.  Please re-consult as needed.   Koleen Distance MS, RD, LDN Please refer to [facility] for RD and/or RD on-call/weekend/after hours pager

## 2022-10-21 NOTE — Progress Notes (Signed)
CSW spoke with patient's brother Ron to answer his questions. Ron had questions regarding patient's insurance and funeral planning. Ron states he spoke with MD earlier who was going to speak with patient's children to determine if they wanted to pursue residential hospice or not. Ron will return call to CSW once a decision is made by the family.  Madilyn Fireman, MSW, LCSW Transitions of Care  Clinical Social Worker II [phone]

## 2022-10-21 NOTE — Progress Notes (Signed)
Nutrition Brief Note  Chart reviewed. Pt now transitioning to comfort care.  No further nutrition interventions planned at this time.  Please re-consult as needed.   Ranell Patrick, RD, LDN Clinical Dietitian RD pager # [phone] in [place]  After hours/weekend pager # available in [facility]

## 2022-10-21 NOTE — Progress Notes (Signed)
PROGRESS NOTE    Richard [name]  MWU:[id number] DOB: Oct 28, 1955 DOA: 10/13/2022 PCP: [facility]   Brief Narrative:   67 year old African-American male with history of end-stage renal disease on hemodialysis, CVA with right-sided weakness, HIV on HAART, diabetes mellitus, seizures and hypertension.  Patient is a skilled nursing facility resident.  Patient was admitted with uremia, potassium of 7.5 BUN of 172 and creatinine of 22.5.  Patient has gangrenous foot.  Patient was deemed to have severe sepsis with septic shock.  Patient was managed by ICU team.  After discussion with patient's family, patient was made comfort directed care.  ICU team has transferred patient to the care of hospitalist service.  Patient currently remains comfort care.  Assessment & Plan:  Principal Problem:   Severe sepsis (Sharon)    Acute metabolic encephalopathy secondary to metabolic disarray from uremia & complicated by gangrenous left foot and encephalopathy due to severe sepsis, superimposed on prior stroke.   Dilaudid drip for comfort, he would like to turn this off for some time in anticipation that he wakes up and they can interact with him which I think is reasonable.  But I do think due to multiple medical issues, he may not   Septic shock with lactic acidosis due to gangrenous foot -Comfort directed care.   Hypovolemia:  No further HD per comfort measures     ESRD (TTS schedule) No further HD per comfort measures   Leukocytosis: Suspect combination of dehydration, volume contraction, sepsis. No further lab checks per comfort   HIV Stop medications for comfort   Anemia: Stable.  Likely related to ESRD, anemia of chronic illness. No further labs for comfort   Sacral decubitus ulcer: Present on admission Aggressive comfort medications   DM: No further glucose checks, insulin per comfort   Will consult TOC in anticipation to transition him to residential hospice as patient  overall continues to remain stable.   DVT prophylaxis: None Code Status: DNR Family Communication: Called Melissa ( no answer); spoke with Ron- updated.   Status is: Inpatient Patient is now comfort care.    Signs/Symptoms: moderate fat depletion, severe muscle depletion  Interventions: Refer to RD note for recommendations  Body mass index is 21.23 kg/m.  Pressure Injury 10/16/22 Ankle Anterior;Right Unstageable - Full thickness tissue loss in which the base of the injury is covered by slough (yellow, tan, gray, green or brown) and/or eschar (tan, brown or black) in the wound bed. eschar (Active)  10/16/22 1358  Location: Ankle  Location Orientation: Anterior;Right  Staging: Unstageable - Full thickness tissue loss in which the base of the injury is covered by slough (yellow, tan, gray, green or brown) and/or eschar (tan, brown or black) in the wound bed.  Wound Description (Comments): eschar  Present on Admission: Yes     Pressure Injury 10/16/22 Knee Anterior;Right (Active)  10/16/22 1359  Location: Knee  Location Orientation: Anterior;Right  Staging:   Wound Description (Comments):   Present on Admission: Yes     Pressure Injury 10/16/22 Sacrum Medial Unstageable - Full thickness tissue loss in which the base of the injury is covered by slough (yellow, tan, gray, green or brown) and/or eschar (tan, brown or black) in the wound bed. Yellow, white, pink 1/26 WOC up (Active)  10/16/22 1359  Location: Sacrum  Location Orientation: Medial  Staging: Unstageable - Full thickness tissue loss in which the base of the injury is covered by slough (yellow, tan, gray, green or brown) and/or eschar (tan,  brown or black) in the wound bed.  Wound Description (Comments): Yellow, white, pink 1/26 WOC updated to Unstagable because base of wound is cover with slough  Present on Admission: Yes     Pressure Injury 10/16/22 Buttocks Right Unstageable - Full thickness tissue loss in which the  base of the injury is covered by slough (yellow, tan, gray, green or brown) and/or eschar (tan, brown or black) in the wound bed. eschar (Active)  10/16/22 1401  Location: Buttocks  Location Orientation: Right  Staging: Unstageable - Full thickness tissue loss in which the base of the injury is covered by slough (yellow, tan, gray, green or brown) and/or eschar (tan, brown or black) in the wound bed.  Wound Description (Comments): eschar  Present on Admission:      Pressure Injury 10/16/22 Back Left Stage 3 -  Full thickness tissue loss. Subcutaneous fat may be visible but bone, tendon or muscle are NOT exposed. pink (Active)  10/16/22 1403  Location: Back  Location Orientation: Left  Staging: Stage 3 -  Full thickness tissue loss. Subcutaneous fat may be visible but bone, tendon or muscle are NOT exposed.  Wound Description (Comments): pink  Present on Admission: Yes        Subjective: No complaints. Resting.   Examination: Constitutional: Not in acute distress. Chronically ill Respiratory: Clear to auscultation bilaterally Cardiovascular: Normal sinus rhythm, no rubs Abdomen:  nondistended good bowel sounds Musculoskeletal: No edema noted Skin: No rashes seen Neurologic: unable to assess Psychiatric: unable to assess  Objective: Vitals:   10/18/22 2000 10/19/22 0337 10/19/22 0748 10/21/22 0729  BP:  (!) 91/56 (!) 86/54 119/60  Pulse: (!) 116 92 (!) 43 (!) 118  Resp: (!) 6 10 14 15   Temp:  (!) 100.4 F (38 C) 98.7 F (37.1 C) (!) 101.8 F (38.8 C)  TempSrc:  Axillary  Oral  SpO2: 92% 92% (!) 43% 94%  Weight:      Height:       No intake or output data in the 24 hours ending 10/21/22 0803 Filed Weights   10/18/22 0443 10/18/22 0806 10/18/22 1134  Weight: 71.5 kg 71.5 kg 71 kg     Data Reviewed:   CBC: Recent Labs  Lab 10/16/22 0600 10/16/22 0701 10/17/22 0352 10/18/22 0326  WBC 16.1* 15.2* 12.7* 16.2*  NEUTROABS 14.3* 13.1*  --   --   HGB 12.1* 11.5*  10.0* 10.1*  HCT 37.1* 35.5* 30.5* 30.5*  MCV 93.0 93.4 92.7 92.1  PLT 234 225 178 275   Basic Metabolic Panel: Recent Labs  Lab 10/02/2022 2107 10/16/22 0701 10/16/22 1734 10/17/22 0352 10/17/22 1706 10/18/22 0326  NA 144 137  --  138  --  135  K >7.5* 5.3*  --  4.8  --  3.9  CL 96* 96*  --  100  --  95*  CO2 19* 15*  --  21*  --  23  GLUCOSE 170* 133*  --  179*  --  172*  BUN 172* 91*  --  99*  --  58*  CREATININE 22.57* 12.68*  --  12.69*  --  7.90*  CALCIUM 10.4* 8.9  --  8.7*  --  8.5*  MG  --   --   --  2.6* 2.0 2.1  PHOS  --   --  7.4* 6.4* 2.4* 3.5   GFR: Estimated Creatinine Clearance: 9.4 mL/min (A) (by C-G formula based on SCr of 7.9 mg/dL (H)). Liver Function Tests: Recent  Labs  Lab 10/06/2022 2107 10/16/22 0701 10/18/22 0326  AST 23 23  --   ALT 14 15  --   ALKPHOS 119 92  --   BILITOT 0.7 1.3*  --   PROT 7.9 6.9  --   ALBUMIN 2.7* 2.2* 1.9*   Recent Labs  Lab 10/13/2022 2107  LIPASE 42   Recent Labs  Lab 10/10/2022 2107  AMMONIA 35   Coagulation Profile: No results for input(s): "INR", "PROTIME" in the last 168 hours. Cardiac Enzymes: No results for input(s): "CKTOTAL", "CKMB", "CKMBINDEX", "TROPONINI" in the last 168 hours. BNP (last 3 results) No results for input(s): "PROBNP" in the last 8760 hours. HbA1C: No results for input(s): "HGBA1C" in the last 72 hours. CBG: Recent Labs  Lab 10/17/22 1937 10/17/22 2322 10/18/22 0318 10/18/22 0727 10/18/22 1118  GLUCAP 80 98 169* 229* 100*   Lipid Profile: No results for input(s): "CHOL", "HDL", "LDLCALC", "TRIG", "CHOLHDL", "LDLDIRECT" in the last 72 hours. Thyroid Function Tests: No results for input(s): "TSH", "T4TOTAL", "FREET4", "T3FREE", "THYROIDAB" in the last 72 hours. Anemia Panel: No results for input(s): "VITAMINB12", "FOLATE", "FERRITIN", "TIBC", "IRON", "RETICCTPCT" in the last 72 hours. Sepsis Labs: Recent Labs  Lab 10/19/2022 2107 10/16/22 0705 10/16/22 1734 10/17/22 0830   LATICACIDVEN 2.1* 1.3 1.4 1.1    Recent Results (from the past 240 hour(s))  Resp panel by RT-PCR (RSV, Flu A&B, Covid) Anterior Nasal Swab     Status: None   Collection Time: 10/11/2022  6:53 PM   Specimen: Anterior Nasal Swab  Result Value Ref Range Status   SARS Coronavirus 2 by RT PCR NEGATIVE NEGATIVE Final    Comment: (NOTE) SARS-CoV-2 target nucleic acids are NOT DETECTED.  The SARS-CoV-2 RNA is generally detectable in upper respiratory specimens during the acute phase of infection. The lowest concentration of SARS-CoV-2 viral copies this assay can detect is 138 copies/mL. A negative result does not preclude SARS-Cov-2 infection and should not be used as the sole basis for treatment or other patient management decisions. A negative result may occur with  improper specimen collection/handling, submission of specimen other than nasopharyngeal swab, presence of viral mutation(s) within the areas targeted by this assay, and inadequate number of viral copies(<138 copies/mL). A negative result must be combined with clinical observations, patient history, and epidemiological information. The expected result is Negative.  Fact Sheet for Patients:  EntrepreneurPulse.com.au  Fact Sheet for Healthcare Providers:  IncredibleEmployment.be  This test is no t yet approved or cleared by the [place] FDA and  has been authorized for detection and/or diagnosis of SARS-CoV-2 by FDA under an Emergency Use Authorization (EUA). This EUA will remain  in effect (meaning this test can be used) for the duration of the COVID-19 declaration under Section 564(b)(1) of the Act, 21 U.S.C.section 360bbb-3(b)(1), unless the authorization is terminated  or revoked sooner.       Influenza A by PCR NEGATIVE NEGATIVE Final   Influenza B by PCR NEGATIVE NEGATIVE Final    Comment: (NOTE) The Xpert Xpress SARS-CoV-2/FLU/RSV plus assay is intended as an aid in the  diagnosis of influenza from Nasopharyngeal swab specimens and should not be used as a sole basis for treatment. Nasal washings and aspirates are unacceptable for Xpert Xpress SARS-CoV-2/FLU/RSV testing.  Fact Sheet for Patients: EntrepreneurPulse.com.au  Fact Sheet for Healthcare Providers: IncredibleEmployment.be  This test is not yet approved or cleared by the [place] FDA and has been authorized for detection and/or diagnosis of SARS-CoV-2 by FDA under an  Emergency Use Authorization (EUA). This EUA will remain in effect (meaning this test can be used) for the duration of the COVID-19 declaration under Section 564(b)(1) of the Act, 21 U.S.C. section 360bbb-3(b)(1), unless the authorization is terminated or revoked.     Resp Syncytial Virus by PCR NEGATIVE NEGATIVE Final    Comment: (NOTE) Fact Sheet for Patients: EntrepreneurPulse.com.au  Fact Sheet for Healthcare Providers: IncredibleEmployment.be  This test is not yet approved or cleared by the [place] FDA and has been authorized for detection and/or diagnosis of SARS-CoV-2 by FDA under an Emergency Use Authorization (EUA). This EUA will remain in effect (meaning this test can be used) for the duration of the COVID-19 declaration under Section 564(b)(1) of the Act, 21 U.S.C. section 360bbb-3(b)(1), unless the authorization is terminated or revoked.  Performed at [facility], [place] [address], [place], [place] [address]   Blood culture (routine x 2)     Status: None   Collection Time: 10/16/22  7:05 AM   Specimen: BLOOD  Result Value Ref Range Status   Specimen Description BLOOD FEMORAL ARTERY  Final   Special Requests   Final    BOTTLES DRAWN AEROBIC ONLY Blood Culture adequate volume   Culture   Final    NO GROWTH 5 DAYS Performed at [facility], [place] [address] [place], [place] [address]    Report Status 10/21/2022  FINAL  Final  Blood culture (routine x 2)     Status: Abnormal   Collection Time: 10/16/22  7:10 AM   Specimen: BLOOD  Result Value Ref Range Status   Specimen Description BLOOD LEFT ANTECUBITAL  Final   Special Requests   Final    BOTTLES DRAWN AEROBIC AND ANAEROBIC Blood Culture adequate volume   Culture  Setup Time   Final    GRAM POSITIVE COCCI IN CLUSTERS ANAEROBIC BOTTLE ONLY CRITICAL RESULT CALLED TO, READ BACK BY AND VERIFIED WITH: PHARMD AUSTIN PAYTES [id number] BY JRS    Culture (A)  Final    STAPHYLOCOCCUS EPIDERMIDIS THE SIGNIFICANCE OF ISOLATING THIS ORGANISM FROM A SINGLE SET OF BLOOD CULTURES WHEN MULTIPLE SETS ARE DRAWN IS UNCERTAIN. PLEASE NOTIFY THE MICROBIOLOGY DEPARTMENT WITHIN ONE WEEK IF SPECIATION AND SENSITIVITIES ARE REQUIRED. Performed at [facility], [place] [address], [place], [place] [address]    Report Status 10/19/2022 FINAL  Final  Blood Culture ID Panel (Reflexed)     Status: Abnormal   Collection Time: 10/16/22  7:10 AM  Result Value Ref Range Status   Enterococcus faecalis NOT DETECTED NOT DETECTED Final   Enterococcus Faecium NOT DETECTED NOT DETECTED Final   Listeria monocytogenes NOT DETECTED NOT DETECTED Final   Staphylococcus species DETECTED (A) NOT DETECTED Final    Comment: CRITICAL RESULT CALLED TO, READ BACK BY AND VERIFIED WITH: PHARMD AUTIN PAYTES [id number] BY JRS    Staphylococcus aureus (BCID) NOT DETECTED NOT DETECTED Final   Staphylococcus epidermidis DETECTED (A) NOT DETECTED Final    Comment: Methicillin (oxacillin) resistant coagulase negative staphylococcus. Possible blood culture contaminant (unless isolated from more than one blood culture draw or clinical case suggests pathogenicity). No antibiotic treatment is indicated for blood  culture contaminants.    Staphylococcus lugdunensis NOT DETECTED NOT DETECTED Final   Streptococcus species NOT DETECTED NOT DETECTED Final   Streptococcus agalactiae NOT DETECTED NOT  DETECTED Final   Streptococcus pneumoniae NOT DETECTED NOT DETECTED Final   Streptococcus pyogenes NOT DETECTED NOT DETECTED Final   A.calcoaceticus-baumannii NOT  DETECTED NOT DETECTED Final   Bacteroides fragilis NOT DETECTED NOT DETECTED Final   Enterobacterales NOT DETECTED NOT DETECTED Final   Enterobacter cloacae complex NOT DETECTED NOT DETECTED Final   Escherichia coli NOT DETECTED NOT DETECTED Final   Klebsiella aerogenes NOT DETECTED NOT DETECTED Final   Klebsiella oxytoca NOT DETECTED NOT DETECTED Final   Klebsiella pneumoniae NOT DETECTED NOT DETECTED Final   Proteus species NOT DETECTED NOT DETECTED Final   Salmonella species NOT DETECTED NOT DETECTED Final   Serratia marcescens NOT DETECTED NOT DETECTED Final   Haemophilus influenzae NOT DETECTED NOT DETECTED Final   Neisseria meningitidis NOT DETECTED NOT DETECTED Final   Pseudomonas aeruginosa NOT DETECTED NOT DETECTED Final   Stenotrophomonas maltophilia NOT DETECTED NOT DETECTED Final   Candida albicans NOT DETECTED NOT DETECTED Final   Candida auris NOT DETECTED NOT DETECTED Final   Candida glabrata NOT DETECTED NOT DETECTED Final   Candida krusei NOT DETECTED NOT DETECTED Final   Candida parapsilosis NOT DETECTED NOT DETECTED Final   Candida tropicalis NOT DETECTED NOT DETECTED Final   Cryptococcus neoformans/gattii NOT DETECTED NOT DETECTED Final   Methicillin resistance mecA/C DETECTED (A) NOT DETECTED Final    Comment: CRITICAL RESULT CALLED TO, READ BACK BY AND VERIFIED WITH: PHARMD AUSTIN PAYTES [id number] BY JRS Performed at [facility], [address] [place], [place] [address]   MRSA Next Gen by PCR, Nasal     Status: None   Collection Time: 10/16/22  1:30 PM   Specimen: Nasal Mucosa; Nasal Swab  Result Value Ref Range Status   MRSA by PCR Next Gen NOT DETECTED NOT DETECTED Final    Comment: (NOTE) The GeneXpert MRSA Assay (FDA approved for NASAL specimens only), is one component of a  comprehensive MRSA colonization surveillance program. It is not intended to diagnose MRSA infection nor to guide or monitor treatment for MRSA infections. Test performance is not FDA approved in patients less than 42 years old. Performed at [facility], [place] [address], [place], [place] [address]          Radiology Studies: No results found.      Scheduled Meds:  sodium chloride   Intravenous Once   glycopyrrolate  0.4 mg Intravenous Q4H   scopolamine  1 patch Transdermal Q72H   Continuous Infusions:  sodium chloride     calcium gluconate     HYDROmorphone 2 mg/hr (10/20/22 1245)     LOS: 5 days   Time spent= 35 mins    Cody Albus Arsenio Loader, MD [facility]  If 7PM-7AM, please contact night-coverage  10/21/2022, 8:03 AM

## 2022-10-22 DIAGNOSIS — R6520 Severe sepsis without septic shock: Secondary | ICD-10-CM | POA: Diagnosis not present

## 2022-10-22 DIAGNOSIS — A419 Sepsis, unspecified organism: Secondary | ICD-10-CM | POA: Diagnosis not present

## 2022-10-22 NOTE — Discharge Summary (Signed)
Physician Discharge Summary  Richard [name] BWL:[id number] DOB: Jan 21, 1956 DOA: 09/26/2022  PCP: [facility]  Admit date: 10/21/2022 Discharge date: 10/22/2022  Admitted From: SNF Disposition:  Residential hospice  Discharge Condition:Stable CODE STATUS: Comfort Care Diet recommendation: Regular  Brief/Interim Summary:  67 year old African-American male with history of end-stage renal disease on hemodialysis, CVA with right-sided weakness, HIV on HAART, diabetes mellitus, seizures and hypertension.  Patient is a skilled nursing facility resident.  Patient was admitted with uremia, potassium of 7.5 BUN of 172 and creatinine of 22.5.  Patient has gangrenous foot.  Patient was deemed to have severe sepsis with septic shock.  Patient was managed by ICU team.  After discussion with patient's family, patient was made comfort directed care.  ICU team has transferred patient to the care of hospitalist service.  Patient currently remains comfort care.  Plan is to discharge to residential hospice whenever possible.  TOC following  Following problems were addressed during the hospitalization:  Acute metabolic encephalopathy secondary to metabolic disarray from uremia & complicated by gangrenous left foot and encephalopathy due to severe sepsis, superimposed on prior stroke.   Now on comfort care,Dilaudid drip   Septic shock with lactic acidosis due to gangrenous foot -Comfort directed care.   Hypovolemia:  No further HD per comfort measures   ESRD (TTS schedule) No further HD per comfort measures   Leukocytosis: Suspect combination of dehydration, volume contraction, sepsis. No further lab checks per comfort   HIV Stop medications for comfort   Anemia: Stable.  Likely related to ESRD, anemia of chronic illness. No further labs for comfort   Sacral decubitus ulcer: Present on admission Aggressive comfort medications   DM: No further glucose checks, insulin per  comfort   Being followed by  TOC in anticipation to transition him to residential hospice as patient overall continues to remain stable.  Discharge Diagnoses:  Principal Problem:   Severe sepsis [facility])    Discharge Instructions  Discharge Instructions     Diet general   Complete by: As directed    No wound care   Complete by: As directed       Allergies as of 10/22/2022       Reactions   Oxycodone Nausea And Vomiting   NOT DOCUMENTED ON MAR        Medication List     STOP taking these medications    Accu-Chek Aviva Plus test strip Generic drug: glucose blood   acetaminophen 325 MG tablet Commonly known as: TYLENOL   b complex-vitamin c-folic acid 0.8 MG Tabs tablet   Bactrim DS 800-160 MG tablet Generic drug: sulfamethoxazole-trimethoprim   bictegravir-emtricitabine-tenofovir AF 50-200-25 MG Tabs tablet Commonly known as: BIKTARVY   blood glucose meter kit and supplies Kit   calcium acetate 667 MG capsule Commonly known as: PHOSLO   carvedilol 6.25 MG tablet Commonly known as: COREG   clopidogrel 75 MG tablet Commonly known as: PLAVIX   Commode 3-In-1 Misc   docusate sodium 100 MG capsule Commonly known as: COLACE   doxepin 75 MG capsule Commonly known as: SINEQUAN   Ergocalciferol 50 MCG (2000 UT) Tabs   feeding supplement (NEPRO CARB STEADY) Liqd   hydrALAZINE 10 MG tablet Commonly known as: APRESOLINE   hydrocortisone cream 1 %   insulin glargine-yfgn 100 UNIT/ML Pen Commonly known as: SEMGLEE   Insulin Pen Needle 31G X 8 MM Misc Commonly known as: Sure Comfort Pen Needles   levETIRAcetam 500 MG tablet Commonly known as: KEPPRA  Lidocaine 3 % Crea   ondansetron 4 MG tablet Commonly known as: ZOFRAN   oxyCODONE 5 MG immediate release tablet Commonly known as: Oxy IR/ROXICODONE   pantoprazole 40 MG tablet Commonly known as: PROTONIX   Retacrit 20000 UNIT/ML injection Generic drug: epoetin alfa-epbx   rosuvastatin 5 MG  tablet Commonly known as: CRESTOR   tamsulosin 0.4 MG Caps capsule Commonly known as: FLOMAX   traMADol 50 MG tablet Commonly known as: Government social research officer Misc   triamcinolone cream 0.1 % Commonly known as: KENALOG   TRUEplus Insulin Syringe 31G X 5/16" 1 ML Misc Generic drug: Insulin Syringe-Needle U-100        Allergies  Allergen Reactions   Oxycodone Nausea And Vomiting    NOT DOCUMENTED ON MAR    Consultations: PCCM, palliative care   Procedures/Studies: DG Abd Portable 1V  Result Date: 10/17/2022 CLINICAL DATA:  Feeding tube placement EXAM: PORTABLE ABDOMEN - 1 VIEW COMPARISON:  Radiograph October 16, 2022 FINDINGS: Enteric feeding catheter courses below the diaphragm with tip projecting over the stomach. Partially visualized left IJ CVC with tip projecting over the right atrium and dual lumen right neck CVC with tip projecting over the right atrium. Cardiac monitoring leads coiled over the chest. The bowel gas pattern is normal. IMPRESSION: Enteric feeding catheter courses below the diaphragm with tip projecting over the stomach. Electronically Signed   By: Dahlia Bailiff M.D.   On: 10/17/2022 10:40   DG Chest Port 1 View  Result Date: 10/17/2022 CLINICAL DATA:  200808 Hypoxia [id number] EXAM: PORTABLE CHEST 1 VIEW COMPARISON:  October 16, 2022 FINDINGS: The cardiomediastinal silhouette is unchanged in contour.RIGHT neck CVC tip terminates over the RIGHT atrium. LEFT IJ CVC tip terminates over the superior cavoatrial junction. Atherosclerotic calcifications of the aorta. No pleural effusion. No pneumothorax. No acute pleuroparenchymal abnormality. IMPRESSION: Support apparatus as described above. No acute cardiopulmonary abnormality. Electronically Signed   By: Valentino Saxon M.D.   On: 10/17/2022 08:58   MR FOOT LEFT WO CONTRAST  Result Date: 10/16/2022 CLINICAL DATA:  Left foot gangrene. EXAM: MRI OF THE LEFT FOOT WITHOUT CONTRAST TECHNIQUE: Multiplanar,  multisequence MR imaging of the left foot was performed. No intravenous contrast was administered. COMPARISON:  Left foot x-rays from yesterday. FINDINGS: Technically limited study due to patient positioning and motion artifact. The distal toes are not well evaluated. Bones/Joint/Cartilage No definite marrow signal abnormality. No fracture or dislocation. Joint spaces are preserved. No joint effusion. Ligaments Collateral ligaments are intact. Lisfranc ligament is intact. Medial and lateral ankle ligaments are grossly intact. Muscles and Tendons Grossly intact. No tenosynovitis. Increased T2 signal within the intrinsic muscles of the forefoot, nonspecific, but likely related to diabetic muscle changes. Soft tissue No fluid collection or hematoma.  No soft tissue mass. IMPRESSION: 1. Technically limited study due to patient positioning and motion artifact. No definite evidence of osteomyelitis. Electronically Signed   By: Titus Dubin M.D.   On: 10/16/2022 17:05   DG Abd 1 View  Result Date: 10/16/2022 CLINICAL DATA:  MRI clearance EXAM: ABDOMEN - 1 VIEW COMPARISON:  09/27/2022 FINDINGS: EKG leads project over abdomen. Nonobstructive bowel gas pattern. Scattered atherosclerotic calcifications. Bones demineralized. No metallic foreign bodies identified. IMPRESSION: No orbital metallic foreign bodies. Electronically Signed   By: Lavonia Dana M.D.   On: 10/16/2022 12:10   DG CHEST PORT 1 VIEW  Result Date: 10/16/2022 CLINICAL DATA:  Central venous catheter in place. EXAM: PORTABLE CHEST 1 VIEW COMPARISON:  One-view  chest x-ray 10/03/2022 FINDINGS: Right IJ dialysis catheter is stable. The tip is in the right atrium. A new left IJ line has been placed. The tip is at the cavoatrial junction. No pneumothorax is present. The heart size is normal. Lung volumes are low. No edema or effusion is present. No focal airspace disease is present. IMPRESSION: 1. Interval placement of left IJ line without radiographic  evidence for complication. 2. Stable right IJ dialysis catheter. 3. Low lung volumes. Electronically Signed   By: San Morelle M.D.   On: 10/16/2022 09:56   DG Foot Complete Left  Result Date: 10/01/2022 CLINICAL DATA:  Wound EXAM: LEFT FOOT - COMPLETE 3 VIEW COMPARISON:  None Available. FINDINGS: Subtalar and talonavicular degenerative changes identified with sclerosis and osteophytes. No acute fracture, dislocation or subluxation identified. No osseous destructive process identified. No radiopaque foreign bodies are seen. IMPRESSION: Degenerative changes. No acute osseous abnormality and no bony destructive process identified. Electronically Signed   By: Sammie Bench M.D.   On: 10/02/2022 19:55   DG Chest 1 View  Result Date: 09/25/2022 CLINICAL DATA:  Wound infection screening EXAM: CHEST  1 VIEW COMPARISON:  09/30/2022 FINDINGS: Cardiac silhouette is unremarkable. No pneumothorax or pleural effusion. The lungs are clear. Aorta is calcified. The visualized skeletal structures are unremarkable. Hemodialysis catheter tip overlies RA/SVC. IMPRESSION: No acute cardiopulmonary process. Electronically Signed   By: Sammie Bench M.D.   On: 09/26/2022 19:51   CT Head Wo Contrast  Result Date: 10/19/2022 CLINICAL DATA:  Delirium EXAM: CT HEAD WITHOUT CONTRAST TECHNIQUE: Contiguous axial images were obtained from the base of the skull through the vertex without intravenous contrast. RADIATION DOSE REDUCTION: This exam was performed according to the departmental dose-optimization program which includes automated exposure control, adjustment of the mA and/or kV according to patient size and/or use of iterative reconstruction technique. COMPARISON:  CT brain 09/27/2022 FINDINGS: Brain: No acute territorial infarction, hemorrhage or intracranial mass. Moderate atrophy. Mild chronic small vessel ischemic changes of the white matter. Stable ventricle size. Vascular: No hyperdense vessels.  Carotid  vascular calcification Skull: Normal. Negative for fracture or focal lesion. Sinuses/Orbits: No acute finding. Old appearing deformity medial wall left orbit. Other: None IMPRESSION: 1. No CT evidence for acute intracranial abnormality. 2. Atrophy and chronic small vessel ischemic changes of the white matter. Electronically Signed   By: Donavan Foil M.D.   On: 09/28/2022 19:42   DG Chest 1 View  Result Date: 09/30/2022 CLINICAL DATA:  Shortness of breath. Per triage note, patient arrived via EMS for reportedly low O2 sats. Not in respiratory distress at this time. EXAM: CHEST  1 VIEW COMPARISON:  Chest radiograph 09/27/2022. FINDINGS: Unchanged cardiomegaly and mediastinal contours. The lungs are well aerated. Unchanged positioning of a right IJ approach tunneled central venous catheter with tip projecting over the right atrium. No pleural effusion or pneumothorax. Visualized skeleton is unremarkable. IMPRESSION: 1. No acute cardiopulmonary findings. 2. Unchanged cardiomegaly. Electronically Signed   By: Emmit Alexanders M.D   On: 09/30/2022 13:40   CT HEAD WO CONTRAST (5MM)  Result Date: 09/27/2022 CLINICAL DATA:  Fall EXAM: CT HEAD WITHOUT CONTRAST CT CERVICAL SPINE WITHOUT CONTRAST TECHNIQUE: Multidetector CT imaging of the head and cervical spine was performed following the standard protocol without intravenous contrast. Multiplanar CT image reconstructions of the cervical spine were also generated. RADIATION DOSE REDUCTION: This exam was performed according to the departmental dose-optimization program which includes automated exposure control, adjustment of the mA and/or kV according to  patient size and/or use of iterative reconstruction technique. COMPARISON:  CT head 07/22/2022, CT head and cervical spine 12/19/2021 FINDINGS: Brain: No evidence of acute infarct, hemorrhage, mass, mass effect, or midline shift. No hydrocephalus or extra-axial fluid collection. Periventricular white matter changes,  likely the sequela of chronic small vessel ischemic disease. Vascular: No hyperdense vessel. Atherosclerotic calcifications in the intracranial carotid and vertebral arteries. Skull: Normal. Negative for fracture or focal lesion. Sinuses/Orbits: No acute finding. Other: The mastoid air cells are well aerated. CT CERVICAL SPINE FINDINGS Alignment: No listhesis. Skull base and vertebrae: No acute fracture. No primary bone lesion or focal pathologic process. Soft tissues and spinal canal: No prevertebral fluid or swelling. No visible canal hematoma. Disc levels: Multilevel degenerative changes, with disc height loss most prominently at C3-C4. Mild spinal canal stenosis at C3-C4. Multilevel uncovertebral and facet arthropathy. Upper chest: Right greater than left apical scarring. No focal pulmonary opacity or pleural effusion. Other: Multiple calcifications and hypoenhancing nodules in the right thyroid lobe, with a 1.8 cm exophytic lesion extending from the right thyroid lobe (series 4, image 84). IMPRESSION: 1. No acute intracranial process. 2. No acute fracture or traumatic listhesis in the cervical spine. 3. Multiple calcifications and hypoenhancing nodules in the right thyroid lobe, with a 1.8 cm exophytic lesion extending from the right thyroid lobe. If this has not previously been evaluated, a non-emergent ultrasound of the thyroid is recommended. (Reference: J Am Coll Radiol. 2015 Feb;12(2): 143-50) Electronically Signed   By: Merilyn Baba M.D.   On: 09/27/2022 03:24   CT Cervical Spine Wo Contrast  Result Date: 09/27/2022 CLINICAL DATA:  Fall EXAM: CT HEAD WITHOUT CONTRAST CT CERVICAL SPINE WITHOUT CONTRAST TECHNIQUE: Multidetector CT imaging of the head and cervical spine was performed following the standard protocol without intravenous contrast. Multiplanar CT image reconstructions of the cervical spine were also generated. RADIATION DOSE REDUCTION: This exam was performed according to the departmental  dose-optimization program which includes automated exposure control, adjustment of the mA and/or kV according to patient size and/or use of iterative reconstruction technique. COMPARISON:  CT head 07/22/2022, CT head and cervical spine 12/19/2021 FINDINGS: Brain: No evidence of acute infarct, hemorrhage, mass, mass effect, or midline shift. No hydrocephalus or extra-axial fluid collection. Periventricular white matter changes, likely the sequela of chronic small vessel ischemic disease. Vascular: No hyperdense vessel. Atherosclerotic calcifications in the intracranial carotid and vertebral arteries. Skull: Normal. Negative for fracture or focal lesion. Sinuses/Orbits: No acute finding. Other: The mastoid air cells are well aerated. CT CERVICAL SPINE FINDINGS Alignment: No listhesis. Skull base and vertebrae: No acute fracture. No primary bone lesion or focal pathologic process. Soft tissues and spinal canal: No prevertebral fluid or swelling. No visible canal hematoma. Disc levels: Multilevel degenerative changes, with disc height loss most prominently at C3-C4. Mild spinal canal stenosis at C3-C4. Multilevel uncovertebral and facet arthropathy. Upper chest: Right greater than left apical scarring. No focal pulmonary opacity or pleural effusion. Other: Multiple calcifications and hypoenhancing nodules in the right thyroid lobe, with a 1.8 cm exophytic lesion extending from the right thyroid lobe (series 4, image 84). IMPRESSION: 1. No acute intracranial process. 2. No acute fracture or traumatic listhesis in the cervical spine. 3. Multiple calcifications and hypoenhancing nodules in the right thyroid lobe, with a 1.8 cm exophytic lesion extending from the right thyroid lobe. If this has not previously been evaluated, a non-emergent ultrasound of the thyroid is recommended. (Reference: J Am Coll Radiol. 2015 Feb;12(2): 143-50) Electronically Signed  By: Merilyn Baba M.D.   On: 09/27/2022 03:24   DG Knee Complete  4 Views Left  Result Date: 09/27/2022 CLINICAL DATA:  Fall, left knee pain EXAM: LEFT KNEE - COMPLETE 4+ VIEW COMPARISON:  None Available. FINDINGS: Normal alignment. No acute fracture or dislocation. No effusion. Advanced vascular calcifications noted. IMPRESSION: 1. No acute fracture or dislocation. Electronically Signed   By: Fidela Salisbury M.D.   On: 09/27/2022 03:04   DG Shoulder Right  Result Date: 09/27/2022 CLINICAL DATA:  Right shoulder pain after fall EXAM: RIGHT SHOULDER - 2+ VIEW COMPARISON:  08/08/2020 FINDINGS: No dislocation. No definite acute fracture. Superior subluxation of the humeral head in relation to the glenoid compatible with rotator cuff pathology. Degenerative arthritis glenohumeral and AC joints. Partially visualized right IJ CVC. IMPRESSION: No acute fracture or dislocation. Electronically Signed   By: Placido Sou M.D.   On: 09/27/2022 03:03   DG Pelvis Portable  Result Date: 09/27/2022 CLINICAL DATA:  Trauma EXAM: PORTABLE PELVIS 1-2 VIEWS COMPARISON:  11/04/2021 and CT 09/19/2022 FINDINGS: Redemonstrated internal rotation of the right hip which limits evaluation of the right femoral neck. Demineralization. No definite acute fracture. No dislocation. Degenerative changes pubic symphysis, both hips, SI joints and lower lumbar spine. IMPRESSION: No definite acute fracture. Electronically Signed   By: Placido Sou M.D.   On: 09/27/2022 02:47   DG Chest Port 1 View  Result Date: 09/27/2022 CLINICAL DATA:  Trauma, fall. EXAM: PORTABLE CHEST 1 VIEW COMPARISON:  08/31/2022. FINDINGS: The heart is enlarged and the mediastinal contour stable. There is atherosclerotic calcification of the aorta. A stable right internal jugular central venous catheter is noted. The lungs are clear without effusions or infiltrates. No pneumothorax. No acute osseous abnormality. IMPRESSION: 1. No active disease. 2. Cardiomegaly. Electronically Signed   By: Brett Fairy M.D.   On: 09/27/2022 02:47       Subjective: Patient seen and examined at bedside today.  On full comfort care.  On Dilaudid drip.  Appears unresponsive with eyes open.  Not in significant distress.  Discharge Exam: Vitals:   10/21/22 2046 10/22/22 0514  BP: 124/70 137/78  Pulse: (!) 110 (!) 108  Resp: 14 12  Temp: 100 F (37.8 C) 99 F (37.2 C)  SpO2: 96% 99%   Vitals:   10/21/22 0729 10/21/22 1609 10/21/22 2046 10/22/22 0514  BP: 119/60 131/68 124/70 137/78  Pulse: (!) 118 (!) 116 (!) 110 (!) 108  Resp: 15 11 14 12   Temp: (!) 101.8 F (38.8 C) (!) 101.8 F (38.8 C) 100 F (37.8 C) 99 F (37.2 C)  TempSrc: Oral Oral Oral Oral  SpO2: 94% 93% 96% 99%  Weight:      Height:        General: Appears comfortable, not in distress.  Eyes open Cardiovascular: RRR, S1/S2  Respiratory: no wheezing, no rhonchi Abdominal: Soft, NT Extremities: no edema, no cyanosis GU: rectal tube    The results of significant diagnostics from this hospitalization (including imaging, microbiology, ancillary and laboratory) are listed below for reference.     Microbiology: Recent Results (from the past 240 hour(s))  Resp panel by RT-PCR (RSV, Flu A&B, Covid) Anterior Nasal Swab     Status: None   Collection Time: 10/09/2022  6:53 PM   Specimen: Anterior Nasal Swab  Result Value Ref Range Status   SARS Coronavirus 2 by RT PCR NEGATIVE NEGATIVE Final    Comment: (NOTE) SARS-CoV-2 target nucleic acids are NOT DETECTED.  The  SARS-CoV-2 RNA is generally detectable in upper respiratory specimens during the acute phase of infection. The lowest concentration of SARS-CoV-2 viral copies this assay can detect is 138 copies/mL. A negative result does not preclude SARS-Cov-2 infection and should not be used as the sole basis for treatment or other patient management decisions. A negative result may occur with  improper specimen collection/handling, submission of specimen other than nasopharyngeal swab, presence of viral  mutation(s) within the areas targeted by this assay, and inadequate number of viral copies(<138 copies/mL). A negative result must be combined with clinical observations, patient history, and epidemiological information. The expected result is Negative.  Fact Sheet for Patients:  EntrepreneurPulse.com.au  Fact Sheet for Healthcare Providers:  IncredibleEmployment.be  This test is no t yet approved or cleared by the [place] FDA and  has been authorized for detection and/or diagnosis of SARS-CoV-2 by FDA under an Emergency Use Authorization (EUA). This EUA will remain  in effect (meaning this test can be used) for the duration of the COVID-19 declaration under Section 564(b)(1) of the Act, 21 U.S.C.section 360bbb-3(b)(1), unless the authorization is terminated  or revoked sooner.       Influenza A by PCR NEGATIVE NEGATIVE Final   Influenza B by PCR NEGATIVE NEGATIVE Final    Comment: (NOTE) The Xpert Xpress SARS-CoV-2/FLU/RSV plus assay is intended as an aid in the diagnosis of influenza from Nasopharyngeal swab specimens and should not be used as a sole basis for treatment. Nasal washings and aspirates are unacceptable for Xpert Xpress SARS-CoV-2/FLU/RSV testing.  Fact Sheet for Patients: EntrepreneurPulse.com.au  Fact Sheet for Healthcare Providers: IncredibleEmployment.be  This test is not yet approved or cleared by the [place] FDA and has been authorized for detection and/or diagnosis of SARS-CoV-2 by FDA under an Emergency Use Authorization (EUA). This EUA will remain in effect (meaning this test can be used) for the duration of the COVID-19 declaration under Section 564(b)(1) of the Act, 21 U.S.C. section 360bbb-3(b)(1), unless the authorization is terminated or revoked.     Resp Syncytial Virus by PCR NEGATIVE NEGATIVE Final    Comment: (NOTE) Fact Sheet for  Patients: EntrepreneurPulse.com.au  Fact Sheet for Healthcare Providers: IncredibleEmployment.be  This test is not yet approved or cleared by the [place] FDA and has been authorized for detection and/or diagnosis of SARS-CoV-2 by FDA under an Emergency Use Authorization (EUA). This EUA will remain in effect (meaning this test can be used) for the duration of the COVID-19 declaration under Section 564(b)(1) of the Act, 21 U.S.C. section 360bbb-3(b)(1), unless the authorization is terminated or revoked.  Performed at [facility], [place] [address] [place], [place] [address]   Blood culture (routine x 2)     Status: None   Collection Time: 10/16/22  7:05 AM   Specimen: BLOOD  Result Value Ref Range Status   Specimen Description BLOOD FEMORAL ARTERY  Final   Special Requests   Final    BOTTLES DRAWN AEROBIC ONLY Blood Culture adequate volume   Culture   Final    NO GROWTH 5 DAYS Performed at [facility], [place] [address] [place], [place] [address]    Report Status 10/21/2022 FINAL  Final  Blood culture (routine x 2)     Status: Abnormal   Collection Time: 10/16/22  7:10 AM   Specimen: BLOOD  Result Value Ref Range Status   Specimen Description BLOOD LEFT ANTECUBITAL  Final   Special Requests   Final    BOTTLES  DRAWN AEROBIC AND ANAEROBIC Blood Culture adequate volume   Culture  Setup Time   Final    GRAM POSITIVE COCCI IN CLUSTERS ANAEROBIC BOTTLE ONLY CRITICAL RESULT CALLED TO, READ BACK BY AND VERIFIED WITH: PHARMD AUSTIN PAYTES [id number] BY JRS    Culture (A)  Final    STAPHYLOCOCCUS EPIDERMIDIS THE SIGNIFICANCE OF ISOLATING THIS ORGANISM FROM A SINGLE SET OF BLOOD CULTURES WHEN MULTIPLE SETS ARE DRAWN IS UNCERTAIN. PLEASE NOTIFY THE MICROBIOLOGY DEPARTMENT WITHIN ONE WEEK IF SPECIATION AND SENSITIVITIES ARE REQUIRED. Performed at [facility], [place] [address], [place], [place] [address]    Report Status  10/19/2022 FINAL  Final  Blood Culture ID Panel (Reflexed)     Status: Abnormal   Collection Time: 10/16/22  7:10 AM  Result Value Ref Range Status   Enterococcus faecalis NOT DETECTED NOT DETECTED Final   Enterococcus Faecium NOT DETECTED NOT DETECTED Final   Listeria monocytogenes NOT DETECTED NOT DETECTED Final   Staphylococcus species DETECTED (A) NOT DETECTED Final    Comment: CRITICAL RESULT CALLED TO, READ BACK BY AND VERIFIED WITH: PHARMD AUTIN PAYTES [id number] BY JRS    Staphylococcus aureus (BCID) NOT DETECTED NOT DETECTED Final   Staphylococcus epidermidis DETECTED (A) NOT DETECTED Final    Comment: Methicillin (oxacillin) resistant coagulase negative staphylococcus. Possible blood culture contaminant (unless isolated from more than one blood culture draw or clinical case suggests pathogenicity). No antibiotic treatment is indicated for blood  culture contaminants.    Staphylococcus lugdunensis NOT DETECTED NOT DETECTED Final   Streptococcus species NOT DETECTED NOT DETECTED Final   Streptococcus agalactiae NOT DETECTED NOT DETECTED Final   Streptococcus pneumoniae NOT DETECTED NOT DETECTED Final   Streptococcus pyogenes NOT DETECTED NOT DETECTED Final   A.calcoaceticus-baumannii NOT DETECTED NOT DETECTED Final   Bacteroides fragilis NOT DETECTED NOT DETECTED Final   Enterobacterales NOT DETECTED NOT DETECTED Final   Enterobacter cloacae complex NOT DETECTED NOT DETECTED Final   Escherichia coli NOT DETECTED NOT DETECTED Final   Klebsiella aerogenes NOT DETECTED NOT DETECTED Final   Klebsiella oxytoca NOT DETECTED NOT DETECTED Final   Klebsiella pneumoniae NOT DETECTED NOT DETECTED Final   Proteus species NOT DETECTED NOT DETECTED Final   Salmonella species NOT DETECTED NOT DETECTED Final   Serratia marcescens NOT DETECTED NOT DETECTED Final   Haemophilus influenzae NOT DETECTED NOT DETECTED Final   Neisseria meningitidis NOT DETECTED NOT DETECTED Final   Pseudomonas  aeruginosa NOT DETECTED NOT DETECTED Final   Stenotrophomonas maltophilia NOT DETECTED NOT DETECTED Final   Candida albicans NOT DETECTED NOT DETECTED Final   Candida auris NOT DETECTED NOT DETECTED Final   Candida glabrata NOT DETECTED NOT DETECTED Final   Candida krusei NOT DETECTED NOT DETECTED Final   Candida parapsilosis NOT DETECTED NOT DETECTED Final   Candida tropicalis NOT DETECTED NOT DETECTED Final   Cryptococcus neoformans/gattii NOT DETECTED NOT DETECTED Final   Methicillin resistance mecA/C DETECTED (A) NOT DETECTED Final    Comment: CRITICAL RESULT CALLED TO, READ BACK BY AND VERIFIED WITH: PHARMD AUSTIN PAYTES [id number] BY JRS Performed at [facility], [address] [place], [place] [address]   MRSA Next Gen by PCR, Nasal     Status: None   Collection Time: 10/16/22  1:30 PM   Specimen: Nasal Mucosa; Nasal Swab  Result Value Ref Range Status   MRSA by PCR Next Gen NOT DETECTED NOT DETECTED Final    Comment: (NOTE) The GeneXpert MRSA Assay (FDA  approved for NASAL specimens only), is one component of a comprehensive MRSA colonization surveillance program. It is not intended to diagnose MRSA infection nor to guide or monitor treatment for MRSA infections. Test performance is not FDA approved in patients less than 83 years old. Performed at [facility], [place] [address] [place], [place] [address]      Labs: BNP (last 3 results) Recent Labs    08/13/22 1020 08/31/22 2342  BNP 1,861.9* 3,664.4*   Basic Metabolic Panel: Recent Labs  Lab 10/14/2022 2107 10/16/22 0701 10/16/22 1734 10/17/22 0352 10/17/22 1706 10/18/22 0326  NA 144 137  --  138  --  135  K >7.5* 5.3*  --  4.8  --  3.9  CL 96* 96*  --  100  --  95*  CO2 19* 15*  --  21*  --  23  GLUCOSE 170* 133*  --  179*  --  172*  BUN 172* 91*  --  99*  --  58*  CREATININE 22.57* 12.68*  --  12.69*  --  7.90*  CALCIUM 10.4* 8.9  --  8.7*  --  8.5*  MG  --   --   --  2.6* 2.0 2.1  PHOS  --    --  7.4* 6.4* 2.4* 3.5   Liver Function Tests: Recent Labs  Lab 09/26/2022 2107 10/16/22 0701 10/18/22 0326  AST 23 23  --   ALT 14 15  --   ALKPHOS 119 92  --   BILITOT 0.7 1.3*  --   PROT 7.9 6.9  --   ALBUMIN 2.7* 2.2* 1.9*   Recent Labs  Lab 09/28/2022 2107  LIPASE 42   Recent Labs  Lab 10/05/2022 2107  AMMONIA 35   CBC: Recent Labs  Lab 10/16/22 0600 10/16/22 0701 10/17/22 0352 10/18/22 0326  WBC 16.1* 15.2* 12.7* 16.2*  NEUTROABS 14.3* 13.1*  --   --   HGB 12.1* 11.5* 10.0* 10.1*  HCT 37.1* 35.5* 30.5* 30.5*  MCV 93.0 93.4 92.7 92.1  PLT 234 225 178 161   Cardiac Enzymes: No results for input(s): "CKTOTAL", "CKMB", "CKMBINDEX", "TROPONINI" in the last 168 hours. BNP: Invalid input(s): "POCBNP" CBG: Recent Labs  Lab 10/17/22 1937 10/17/22 2322 10/18/22 0318 10/18/22 0727 10/18/22 1118  GLUCAP 80 98 169* 229* 100*   D-Dimer No results for input(s): "DDIMER" in the last 72 hours. Hgb A1c No results for input(s): "HGBA1C" in the last 72 hours. Lipid Profile No results for input(s): "CHOL", "HDL", "LDLCALC", "TRIG", "CHOLHDL", "LDLDIRECT" in the last 72 hours. Thyroid function studies No results for input(s): "TSH", "T4TOTAL", "T3FREE", "THYROIDAB" in the last 72 hours.  Invalid input(s): "FREET3" Anemia work up No results for input(s): "VITAMINB12", "FOLATE", "FERRITIN", "TIBC", "IRON", "RETICCTPCT" in the last 72 hours. Urinalysis    Component Value Date/Time   COLORURINE AMBER (A) 10/18/2022 0430   APPEARANCEUR CLOUDY (A) 10/18/2022 0430   LABSPEC 1.015 10/18/2022 0430   PHURINE 5.0 10/18/2022 0430   GLUCOSEU NEGATIVE 10/18/2022 0430   HGBUR SMALL (A) 10/18/2022 0430   BILIRUBINUR NEGATIVE 10/18/2022 0430   KETONESUR NEGATIVE 10/18/2022 0430   PROTEINUR >=300 (A) 10/18/2022 0430   UROBILINOGEN 0.2 11/29/2014 2003   NITRITE NEGATIVE 10/18/2022 0430   LEUKOCYTESUR NEGATIVE 10/18/2022 0430   Sepsis Labs Recent Labs  Lab 10/16/22 0600  10/16/22 0701 10/17/22 0352 10/18/22 0326  WBC 16.1* 15.2* 12.7* 16.2*   Microbiology Recent Results (from the past 240 hour(s))  Resp panel by  RT-PCR (RSV, Flu A&B, Covid) Anterior Nasal Swab     Status: None   Collection Time: 09/26/2022  6:53 PM   Specimen: Anterior Nasal Swab  Result Value Ref Range Status   SARS Coronavirus 2 by RT PCR NEGATIVE NEGATIVE Final    Comment: (NOTE) SARS-CoV-2 target nucleic acids are NOT DETECTED.  The SARS-CoV-2 RNA is generally detectable in upper respiratory specimens during the acute phase of infection. The lowest concentration of SARS-CoV-2 viral copies this assay can detect is 138 copies/mL. A negative result does not preclude SARS-Cov-2 infection and should not be used as the sole basis for treatment or other patient management decisions. A negative result may occur with  improper specimen collection/handling, submission of specimen other than nasopharyngeal swab, presence of viral mutation(s) within the areas targeted by this assay, and inadequate number of viral copies(<138 copies/mL). A negative result must be combined with clinical observations, patient history, and epidemiological information. The expected result is Negative.  Fact Sheet for Patients:  EntrepreneurPulse.com.au  Fact Sheet for Healthcare Providers:  IncredibleEmployment.be  This test is no t yet approved or cleared by the [place] FDA and  has been authorized for detection and/or diagnosis of SARS-CoV-2 by FDA under an Emergency Use Authorization (EUA). This EUA will remain  in effect (meaning this test can be used) for the duration of the COVID-19 declaration under Section 564(b)(1) of the Act, 21 U.S.C.section 360bbb-3(b)(1), unless the authorization is terminated  or revoked sooner.       Influenza A by PCR NEGATIVE NEGATIVE Final   Influenza B by PCR NEGATIVE NEGATIVE Final    Comment: (NOTE) The Xpert Xpress  SARS-CoV-2/FLU/RSV plus assay is intended as an aid in the diagnosis of influenza from Nasopharyngeal swab specimens and should not be used as a sole basis for treatment. Nasal washings and aspirates are unacceptable for Xpert Xpress SARS-CoV-2/FLU/RSV testing.  Fact Sheet for Patients: EntrepreneurPulse.com.au  Fact Sheet for Healthcare Providers: IncredibleEmployment.be  This test is not yet approved or cleared by the [place] FDA and has been authorized for detection and/or diagnosis of SARS-CoV-2 by FDA under an Emergency Use Authorization (EUA). This EUA will remain in effect (meaning this test can be used) for the duration of the COVID-19 declaration under Section 564(b)(1) of the Act, 21 U.S.C. section 360bbb-3(b)(1), unless the authorization is terminated or revoked.     Resp Syncytial Virus by PCR NEGATIVE NEGATIVE Final    Comment: (NOTE) Fact Sheet for Patients: EntrepreneurPulse.com.au  Fact Sheet for Healthcare Providers: IncredibleEmployment.be  This test is not yet approved or cleared by the [place] FDA and has been authorized for detection and/or diagnosis of SARS-CoV-2 by FDA under an Emergency Use Authorization (EUA). This EUA will remain in effect (meaning this test can be used) for the duration of the COVID-19 declaration under Section 564(b)(1) of the Act, 21 U.S.C. section 360bbb-3(b)(1), unless the authorization is terminated or revoked.  Performed at [facility], [place] [address], [place], [place] [address]   Blood culture (routine x 2)     Status: None   Collection Time: 10/16/22  7:05 AM   Specimen: BLOOD  Result Value Ref Range Status   Specimen Description BLOOD FEMORAL ARTERY  Final   Special Requests   Final    BOTTLES DRAWN AEROBIC ONLY Blood Culture adequate volume   Culture   Final    NO GROWTH 5 DAYS Performed at [facility], [place] [address], [place], [place] [address]  Report Status 10/21/2022 FINAL  Final  Blood culture (routine x 2)     Status: Abnormal   Collection Time: 10/16/22  7:10 AM   Specimen: BLOOD  Result Value Ref Range Status   Specimen Description BLOOD LEFT ANTECUBITAL  Final   Special Requests   Final    BOTTLES DRAWN AEROBIC AND ANAEROBIC Blood Culture adequate volume   Culture  Setup Time   Final    GRAM POSITIVE COCCI IN CLUSTERS ANAEROBIC BOTTLE ONLY CRITICAL RESULT CALLED TO, READ BACK BY AND VERIFIED WITH: PHARMD AUSTIN PAYTES [id number] BY JRS    Culture (A)  Final    STAPHYLOCOCCUS EPIDERMIDIS THE SIGNIFICANCE OF ISOLATING THIS ORGANISM FROM A SINGLE SET OF BLOOD CULTURES WHEN MULTIPLE SETS ARE DRAWN IS UNCERTAIN. PLEASE NOTIFY THE MICROBIOLOGY DEPARTMENT WITHIN ONE WEEK IF SPECIATION AND SENSITIVITIES ARE REQUIRED. Performed at [facility], [place] [address], [place], [place] [address]    Report Status 10/19/2022 FINAL  Final  Blood Culture ID Panel (Reflexed)     Status: Abnormal   Collection Time: 10/16/22  7:10 AM  Result Value Ref Range Status   Enterococcus faecalis NOT DETECTED NOT DETECTED Final   Enterococcus Faecium NOT DETECTED NOT DETECTED Final   Listeria monocytogenes NOT DETECTED NOT DETECTED Final   Staphylococcus species DETECTED (A) NOT DETECTED Final    Comment: CRITICAL RESULT CALLED TO, READ BACK BY AND VERIFIED WITH: PHARMD AUTIN PAYTES [id number] BY JRS    Staphylococcus aureus (BCID) NOT DETECTED NOT DETECTED Final   Staphylococcus epidermidis DETECTED (A) NOT DETECTED Final    Comment: Methicillin (oxacillin) resistant coagulase negative staphylococcus. Possible blood culture contaminant (unless isolated from more than one blood culture draw or clinical case suggests pathogenicity). No antibiotic treatment is indicated for blood  culture contaminants.    Staphylococcus lugdunensis NOT DETECTED NOT DETECTED Final   Streptococcus species NOT DETECTED NOT DETECTED  Final   Streptococcus agalactiae NOT DETECTED NOT DETECTED Final   Streptococcus pneumoniae NOT DETECTED NOT DETECTED Final   Streptococcus pyogenes NOT DETECTED NOT DETECTED Final   A.calcoaceticus-baumannii NOT DETECTED NOT DETECTED Final   Bacteroides fragilis NOT DETECTED NOT DETECTED Final   Enterobacterales NOT DETECTED NOT DETECTED Final   Enterobacter cloacae complex NOT DETECTED NOT DETECTED Final   Escherichia coli NOT DETECTED NOT DETECTED Final   Klebsiella aerogenes NOT DETECTED NOT DETECTED Final   Klebsiella oxytoca NOT DETECTED NOT DETECTED Final   Klebsiella pneumoniae NOT DETECTED NOT DETECTED Final   Proteus species NOT DETECTED NOT DETECTED Final   Salmonella species NOT DETECTED NOT DETECTED Final   Serratia marcescens NOT DETECTED NOT DETECTED Final   Haemophilus influenzae NOT DETECTED NOT DETECTED Final   Neisseria meningitidis NOT DETECTED NOT DETECTED Final   Pseudomonas aeruginosa NOT DETECTED NOT DETECTED Final   Stenotrophomonas maltophilia NOT DETECTED NOT DETECTED Final   Candida albicans NOT DETECTED NOT DETECTED Final   Candida auris NOT DETECTED NOT DETECTED Final   Candida glabrata NOT DETECTED NOT DETECTED Final   Candida krusei NOT DETECTED NOT DETECTED Final   Candida parapsilosis NOT DETECTED NOT DETECTED Final   Candida tropicalis NOT DETECTED NOT DETECTED Final   Cryptococcus neoformans/gattii NOT DETECTED NOT DETECTED Final   Methicillin resistance mecA/C DETECTED (A) NOT DETECTED Final    Comment: CRITICAL RESULT CALLED TO, READ BACK BY AND VERIFIED WITH: PHARMD AUSTIN PAYTES [id number] BY JRS Performed at [facility], [address], [name], [place] [address]  MRSA Next Gen by PCR, Nasal     Status: None   Collection Time: 10/16/22  1:30 PM   Specimen: Nasal Mucosa; Nasal Swab  Result Value Ref Range Status   MRSA by PCR Next Gen NOT DETECTED NOT DETECTED Final    Comment: (NOTE) The GeneXpert MRSA Assay (FDA approved for  NASAL specimens only), is one component of a comprehensive MRSA colonization surveillance program. It is not intended to diagnose MRSA infection nor to guide or monitor treatment for MRSA infections. Test performance is not FDA approved in patients less than 49 years old. Performed at [facility], [place] [address] [place], [place] [address]     Please note: You were cared for by a hospitalist during your hospital stay. Once you are discharged, your primary care physician will handle any further medical issues. Please note that NO REFILLS for any discharge medications will be authorized once you are discharged, as it is imperative that you return to your primary care physician (or establish a relationship with a primary care physician if you do not have one) for your post hospital discharge needs so that they can reassess your need for medications and monitor your lab values.    Time coordinating discharge: 40 minutes  SIGNED:   Shelly Coss, MD  [facility] 10/22/2022, 10:39 AM Pager [phone]  If 7PM-7AM, please contact night-coverage [email or web address] Password [email or web address]

## 2022-10-22 NOTE — Care Management Important Message (Signed)
Important Message  Patient Details  Name: Richard [name] MRN: [id number] Date of Birth: March 10, 1956   Medicare Important Message Given:  Yes     Tiaria Biby [address] 10/22/2022, 1:44 PM

## 2022-10-22 NOTE — Progress Notes (Signed)
CSW spoke with patient's brother Ron who states the family has not made a collective decision yet regarding inpatient hospice or a hospital death. Ron is waiting to hear back from his brother to discuss the options and make a final decision.  CSW informed RN and MD of information.  Madilyn Fireman, MSW, LCSW Transitions of Care  Clinical Social Worker II [phone]

## 2022-10-22 NOTE — Progress Notes (Signed)
Care received and assumed. Pt is sleeping resp is labored, and even. Pt appears comfortable. Pt answers when name is called. Pt does not follow directions. Mouth open eyes open. Resp are shallow. NS plus hydromorphone is infusing at ordered rates. See Mar. Safety measures maintained. Call bell within reach and call light activated. RN will continue to monitor for acute changes rounding Q hour and delegating NT to round Q hour as well.

## 2022-10-22 NOTE — Progress Notes (Signed)
Wasted 5 ML of Dilaudid in waste bin with Investment banker, corporate. RN.

## 2022-10-23 DIAGNOSIS — R6520 Severe sepsis without septic shock: Secondary | ICD-10-CM | POA: Diagnosis not present

## 2022-10-23 DIAGNOSIS — A419 Sepsis, unspecified organism: Secondary | ICD-10-CM | POA: Diagnosis not present

## 2022-10-23 NOTE — Progress Notes (Addendum)
PROGRESS NOTE  Richard [name]  RAX:[id number] DOB: 19-Feb-1956 DOA: 10/01/2022 PCP: [facility]   Brief Narrative:  67 year old African-American male with history of end-stage renal disease on hemodialysis, CVA with right-sided weakness, HIV on HAART, diabetes mellitus, seizures and hypertension.  Patient is a skilled nursing facility resident.  Patient was admitted with uremia, potassium of 7.5 BUN of 172 and creatinine of 22.5.  Patient has gangrenous foot.  Patient was deemed to have severe sepsis with septic shock.  Patient was managed by ICU team.  After discussion with patient's family, patient was made comfort directed care.  ICU team transferred patient to the care of hospitalist service.  Patient currently remains comfort care. We tried  to discuss  with family if they are interested to pursue residential hospice.  TOC following.Brother Ron wants to continue in patient hospice here in hospital.  Assessment & Plan:  Principal Problem:   Severe sepsis ([place])  Acute metabolic encephalopathy secondary to metabolic disarray from uremia & complicated by gangrenous left foot and encephalopathy due to severe sepsis, superimposed on prior stroke.   Now on comfort care,Dilaudid drip   Septic shock with lactic acidosis due to gangrenous foot -Comfort directed care.   Hypovolemia:  No further HD per comfort measures   ESRD (TTS schedule) No further HD per comfort measures   Leukocytosis: Suspect combination of dehydration, volume contraction, sepsis. No further lab checks per comfort   HIV Stop medications for comfort   Anemia: Stable.  Likely related to ESRD, anemia of chronic illness. No further labs for comfort   Sacral decubitus ulcer: Present on admission Aggressive comfort medications   DM: No further glucose checks, insulin per comfort   Being followed by  TOC in anticipation to transition him to residential hospice as patient overall continues to remain  stable. Not anticipating hospital death quite soon. Called and discussed with brother Ron today, he wants to continue inpatient hospice, he is not interested on transferring him to residential hospice   Nutrition Problem: Moderate Malnutrition Etiology: chronic illness (HIV) Pressure Injury 10/16/22 Ankle Anterior;Right Unstageable - Full thickness tissue loss in which the base of the injury is covered by slough (yellow, tan, gray, green or brown) and/or eschar (tan, brown or black) in the wound bed. eschar (Active)  10/16/22 1358  Location: Ankle  Location Orientation: Anterior;Right  Staging: Unstageable - Full thickness tissue loss in which the base of the injury is covered by slough (yellow, tan, gray, green or brown) and/or eschar (tan, brown or black) in the wound bed.  Wound Description (Comments): eschar  Present on Admission: Yes  Dressing Type None 10/22/22 0900     Pressure Injury 10/16/22 Knee Anterior;Right (Active)  10/16/22 1359  Location: Knee  Location Orientation: Anterior;Right  Staging:   Wound Description (Comments):   Present on Admission: Yes  Dressing Type None 10/22/22 0900     Pressure Injury 10/16/22 Sacrum Medial Unstageable - Full thickness tissue loss in which the base of the injury is covered by slough (yellow, tan, gray, green or brown) and/or eschar (tan, brown or black) in the wound bed. Yellow, white, pink 1/26 WOC up (Active)  10/16/22 1359  Location: Sacrum  Location Orientation: Medial  Staging: Unstageable - Full thickness tissue loss in which the base of the injury is covered by slough (yellow, tan, gray, green or brown) and/or eschar (tan, brown or black) in the wound bed.  Wound Description (Comments): Yellow, white, pink 1/26 WOC updated to [place] because  base of wound is cover with slough  Present on Admission: Yes  Dressing Type Foam - Lift dressing to assess site every shift 10/22/22 0900     Pressure Injury 10/16/22 Buttocks Right  Unstageable - Full thickness tissue loss in which the base of the injury is covered by slough (yellow, tan, gray, green or brown) and/or eschar (tan, brown or black) in the wound bed. eschar (Active)  10/16/22 1401  Location: Buttocks  Location Orientation: Right  Staging: Unstageable - Full thickness tissue loss in which the base of the injury is covered by slough (yellow, tan, gray, green or brown) and/or eschar (tan, brown or black) in the wound bed.  Wound Description (Comments): eschar  Present on Admission:   Dressing Type Foam - Lift dressing to assess site every shift 10/22/22 0900     Pressure Injury 10/16/22 Back Left Stage 3 -  Full thickness tissue loss. Subcutaneous fat may be visible but bone, tendon or muscle are NOT exposed. pink (Active)  10/16/22 1403  Location: Back  Location Orientation: Left  Staging: Stage 3 -  Full thickness tissue loss. Subcutaneous fat may be visible but bone, tendon or muscle are NOT exposed.  Wound Description (Comments): pink  Present on Admission: Yes  Dressing Type None 10/22/22 0900    DVT prophylaxis:None     Code Status: DNR  Family Communication:  Called and discussed with brother on phone on 2/1  Patient status:Inpatient  Patient is from :Home  Anticipated discharge to: Hospital death  Estimated DC date: Not sure   Consultants: PCCM    Antimicrobials:  Anti-infectives (From admission, onward)    Start     Dose/Rate Route Frequency Ordered Stop   10/18/22 1200  vancomycin (VANCOREADY) IVPB 750 mg/150 mL  Status:  Discontinued        750 mg 150 mL/hr over 60 Minutes Intravenous Every T-Th-Sa (Hemodialysis) 10/16/22 1019 10/18/22 1128   10/17/22 2200  ceFEPIme (MAXIPIME) 1 g in sodium chloride 0.9 % 100 mL IVPB  Status:  Discontinued        1 g 200 mL/hr over 30 Minutes Intravenous Every 24 hours 10/17/22 0710 10/17/22 0752   10/17/22 1800  bictegravir-emtricitabine-tenofovir AF (BIKTARVY) 50-200-25 MG per tablet 1  tablet  Status:  Discontinued        1 tablet Oral Daily 10/17/22 0844 10/18/22 1128   10/17/22 0900  vancomycin (VANCOREADY) IVPB 750 mg/150 mL        750 mg 150 mL/hr over 60 Minutes Intravenous  Once 10/17/22 0801 10/17/22 1707   10/17/22 0845  piperacillin-tazobactam (ZOSYN) IVPB 2.25 g  Status:  Discontinued        2.25 g 100 mL/hr over 30 Minutes Intravenous Every 8 hours 10/17/22 0758 10/18/22 1128   10/16/22 1500  bictegravir-emtricitabine-tenofovir AF (BIKTARVY) 50-200-25 MG per tablet 1 tablet  Status:  Discontinued        1 tablet Oral Daily 10/16/22 1328 10/17/22 0844   10/16/22 1100  ceFEPIme (MAXIPIME) 1 g in sodium chloride 0.9 % 100 mL IVPB  Status:  Discontinued        1 g 200 mL/hr over 30 Minutes Intravenous Every 24 hours 10/16/22 0955 10/17/22 0710   10/16/22 1000  vancomycin (VANCOREADY) IVPB 1500 mg/300 mL        1,500 mg 150 mL/hr over 120 Minutes Intravenous  Once 10/16/22 0955 10/16/22 1604   10/16/22 0915  piperacillin-tazobactam (ZOSYN) IVPB 3.375 g  Status:  Discontinued  3.375 g 100 mL/hr over 30 Minutes Intravenous  Once 10/16/22 0910 10/16/22 0955       Subjective: Patient seen and examined at bedside today.  On full comfort care.  Not in any kind of distress.  On Dilaudid drip.  Objective: Vitals:   10/22/22 0514 10/22/22 2023 10/23/22 0630 10/23/22 0816  BP: 137/78 (!) 147/71 127/63 122/65  Pulse: (!) 108 99 (!) 110 (!) 110  Resp: 12 18 12 17   Temp: 99 F (37.2 C) (!) 101.1 F (38.4 C) 99.5 F (37.5 C) 98.4 F (36.9 C)  TempSrc: Oral Oral  Oral  SpO2: 99% (!) 88% 100% 93%  Weight:      Height:        Intake/Output Summary (Last 24 hours) at 10/23/2022 1100 Last data filed at 10/22/2022 2314 Gross per 24 hour  Intake 43.53 ml  Output --  Net 43.53 ml   Filed Weights   10/18/22 0443 10/18/22 0806 10/18/22 1134  Weight: 71.5 kg 71.5 kg 71 kg    Examination:  General exam: Overall comfortable, not in distress, not agitated,  mouth open,central lines on left neck and right chest   Data Reviewed: I have personally reviewed following labs and imaging studies  CBC: Recent Labs  Lab 10/17/22 0352 10/18/22 0326  WBC 12.7* 16.2*  HGB 10.0* 10.1*  HCT 30.5* 30.5*  MCV 92.7 92.1  PLT 178 662   Basic Metabolic Panel: Recent Labs  Lab 10/16/22 1734 10/17/22 0352 10/17/22 1706 10/18/22 0326  NA  --  138  --  135  K  --  4.8  --  3.9  CL  --  100  --  95*  CO2  --  21*  --  23  GLUCOSE  --  179*  --  172*  BUN  --  99*  --  58*  CREATININE  --  12.69*  --  7.90*  CALCIUM  --  8.7*  --  8.5*  MG  --  2.6* 2.0 2.1  PHOS 7.4* 6.4* 2.4* 3.5     Recent Results (from the past 240 hour(s))  Resp panel by RT-PCR (RSV, Flu A&B, Covid) Anterior Nasal Swab     Status: None   Collection Time: 10/11/2022  6:53 PM   Specimen: Anterior Nasal Swab  Result Value Ref Range Status   SARS Coronavirus 2 by RT PCR NEGATIVE NEGATIVE Final    Comment: (NOTE) SARS-CoV-2 target nucleic acids are NOT DETECTED.  The SARS-CoV-2 RNA is generally detectable in upper respiratory specimens during the acute phase of infection. The lowest concentration of SARS-CoV-2 viral copies this assay can detect is 138 copies/mL. A negative result does not preclude SARS-Cov-2 infection and should not be used as the sole basis for treatment or other patient management decisions. A negative result may occur with  improper specimen collection/handling, submission of specimen other than nasopharyngeal swab, presence of viral mutation(s) within the areas targeted by this assay, and inadequate number of viral copies(<138 copies/mL). A negative result must be combined with clinical observations, patient history, and epidemiological information. The expected result is Negative.  Fact Sheet for Patients:  EntrepreneurPulse.com.au  Fact Sheet for Healthcare Providers:  IncredibleEmployment.be  This test is no  t yet approved or cleared by the [place] FDA and  has been authorized for detection and/or diagnosis of SARS-CoV-2 by FDA under an Emergency Use Authorization (EUA). This EUA will remain  in effect (meaning this test can be used) for the duration  of the COVID-19 declaration under Section 564(b)(1) of the Act, 21 U.S.C.section 360bbb-3(b)(1), unless the authorization is terminated  or revoked sooner.       Influenza A by PCR NEGATIVE NEGATIVE Final   Influenza B by PCR NEGATIVE NEGATIVE Final    Comment: (NOTE) The Xpert Xpress SARS-CoV-2/FLU/RSV plus assay is intended as an aid in the diagnosis of influenza from Nasopharyngeal swab specimens and should not be used as a sole basis for treatment. Nasal washings and aspirates are unacceptable for Xpert Xpress SARS-CoV-2/FLU/RSV testing.  Fact Sheet for Patients: EntrepreneurPulse.com.au  Fact Sheet for Healthcare Providers: IncredibleEmployment.be  This test is not yet approved or cleared by the [place] FDA and has been authorized for detection and/or diagnosis of SARS-CoV-2 by FDA under an Emergency Use Authorization (EUA). This EUA will remain in effect (meaning this test can be used) for the duration of the COVID-19 declaration under Section 564(b)(1) of the Act, 21 U.S.C. section 360bbb-3(b)(1), unless the authorization is terminated or revoked.     Resp Syncytial Virus by PCR NEGATIVE NEGATIVE Final    Comment: (NOTE) Fact Sheet for Patients: EntrepreneurPulse.com.au  Fact Sheet for Healthcare Providers: IncredibleEmployment.be  This test is not yet approved or cleared by the [place] FDA and has been authorized for detection and/or diagnosis of SARS-CoV-2 by FDA under an Emergency Use Authorization (EUA). This EUA will remain in effect (meaning this test can be used) for the duration of the COVID-19 declaration under Section  564(b)(1) of the Act, 21 U.S.C. section 360bbb-3(b)(1), unless the authorization is terminated or revoked.  Performed at [facility], [place] [address] [place], [place] [address]   Blood culture (routine x 2)     Status: None   Collection Time: 10/16/22  7:05 AM   Specimen: BLOOD  Result Value Ref Range Status   Specimen Description BLOOD FEMORAL ARTERY  Final   Special Requests   Final    BOTTLES DRAWN AEROBIC ONLY Blood Culture adequate volume   Culture   Final    NO GROWTH 5 DAYS Performed at [facility], [place] [address], [place], [place] [address]    Report Status 10/21/2022 FINAL  Final  Blood culture (routine x 2)     Status: Abnormal   Collection Time: 10/16/22  7:10 AM   Specimen: BLOOD  Result Value Ref Range Status   Specimen Description BLOOD LEFT ANTECUBITAL  Final   Special Requests   Final    BOTTLES DRAWN AEROBIC AND ANAEROBIC Blood Culture adequate volume   Culture  Setup Time   Final    GRAM POSITIVE COCCI IN CLUSTERS ANAEROBIC BOTTLE ONLY CRITICAL RESULT CALLED TO, READ BACK BY AND VERIFIED WITH: PHARMD AUSTIN PAYTES [id number] BY JRS    Culture (A)  Final    STAPHYLOCOCCUS EPIDERMIDIS THE SIGNIFICANCE OF ISOLATING THIS ORGANISM FROM A SINGLE SET OF BLOOD CULTURES WHEN MULTIPLE SETS ARE DRAWN IS UNCERTAIN. PLEASE NOTIFY THE MICROBIOLOGY DEPARTMENT WITHIN ONE WEEK IF SPECIATION AND SENSITIVITIES ARE REQUIRED. Performed at [facility], [place] [address], [place], [place] [address]    Report Status 10/19/2022 FINAL  Final  Blood Culture ID Panel (Reflexed)     Status: Abnormal   Collection Time: 10/16/22  7:10 AM  Result Value Ref Range Status   Enterococcus faecalis NOT DETECTED NOT DETECTED Final   Enterococcus Faecium NOT DETECTED NOT DETECTED Final   Listeria monocytogenes NOT DETECTED NOT DETECTED Final   Staphylococcus species DETECTED (A) NOT  DETECTED Final    Comment: CRITICAL RESULT CALLED TO, READ BACK BY AND VERIFIED WITH: PHARMD  AUTIN PAYTES [id number] BY JRS    Staphylococcus aureus (BCID) NOT DETECTED NOT DETECTED Final   Staphylococcus epidermidis DETECTED (A) NOT DETECTED Final    Comment: Methicillin (oxacillin) resistant coagulase negative staphylococcus. Possible blood culture contaminant (unless isolated from more than one blood culture draw or clinical case suggests pathogenicity). No antibiotic treatment is indicated for blood  culture contaminants.    Staphylococcus lugdunensis NOT DETECTED NOT DETECTED Final   Streptococcus species NOT DETECTED NOT DETECTED Final   Streptococcus agalactiae NOT DETECTED NOT DETECTED Final   Streptococcus pneumoniae NOT DETECTED NOT DETECTED Final   Streptococcus pyogenes NOT DETECTED NOT DETECTED Final   A.calcoaceticus-baumannii NOT DETECTED NOT DETECTED Final   Bacteroides fragilis NOT DETECTED NOT DETECTED Final   Enterobacterales NOT DETECTED NOT DETECTED Final   Enterobacter cloacae complex NOT DETECTED NOT DETECTED Final   Escherichia coli NOT DETECTED NOT DETECTED Final   Klebsiella aerogenes NOT DETECTED NOT DETECTED Final   Klebsiella oxytoca NOT DETECTED NOT DETECTED Final   Klebsiella pneumoniae NOT DETECTED NOT DETECTED Final   Proteus species NOT DETECTED NOT DETECTED Final   Salmonella species NOT DETECTED NOT DETECTED Final   Serratia marcescens NOT DETECTED NOT DETECTED Final   Haemophilus influenzae NOT DETECTED NOT DETECTED Final   Neisseria meningitidis NOT DETECTED NOT DETECTED Final   Pseudomonas aeruginosa NOT DETECTED NOT DETECTED Final   Stenotrophomonas maltophilia NOT DETECTED NOT DETECTED Final   Candida albicans NOT DETECTED NOT DETECTED Final   Candida auris NOT DETECTED NOT DETECTED Final   Candida glabrata NOT DETECTED NOT DETECTED Final   Candida krusei NOT DETECTED NOT DETECTED Final   Candida parapsilosis NOT DETECTED NOT DETECTED Final   Candida tropicalis NOT DETECTED NOT DETECTED Final   Cryptococcus neoformans/gattii NOT  DETECTED NOT DETECTED Final   Methicillin resistance mecA/C DETECTED (A) NOT DETECTED Final    Comment: CRITICAL RESULT CALLED TO, READ BACK BY AND VERIFIED WITH: PHARMD AUSTIN PAYTES [id number] BY JRS Performed at [facility], [address] [place], [place] [address]   MRSA Next Gen by PCR, Nasal     Status: None   Collection Time: 10/16/22  1:30 PM   Specimen: Nasal Mucosa; Nasal Swab  Result Value Ref Range Status   MRSA by PCR Next Gen NOT DETECTED NOT DETECTED Final    Comment: (NOTE) The GeneXpert MRSA Assay (FDA approved for NASAL specimens only), is one component of a comprehensive MRSA colonization surveillance program. It is not intended to diagnose MRSA infection nor to guide or monitor treatment for MRSA infections. Test performance is not FDA approved in patients less than 23 years old. Performed at [facility], [place] [address], [place],  [address]      Radiology Studies: No results found.  Scheduled Meds:  sodium chloride   Intravenous Once   glycopyrrolate  0.4 mg Intravenous Q4H   scopolamine  1 patch Transdermal Q72H   Continuous Infusions:  sodium chloride     calcium gluconate     HYDROmorphone 2 mg/hr (10/22/22 2314)     LOS: 7 days   Shelly Coss, MD [facility] P2/09/2022, 11:00 AM

## 2022-10-23 DEATH — deceased

## 2022-11-21 NOTE — Progress Notes (Signed)
Patient found pulseless at 0800, 2 RN pronounced.  Family called and in to visit.  Body bathed. Family have not decided on a funeral home and will need to discuss it with family.

## 2022-11-21 NOTE — Progress Notes (Addendum)
CSW spoke with RN at progression who states patient has expired.  CSW spoke with patient's daughter Lenna Sciara who is agreeable to receive a funeral home list via e-mail - CSW sent list.  Madilyn Fireman, MSW, LCSW Transitions of Care  Clinical Social Worker II [phone]

## 2022-11-21 NOTE — Discharge Summary (Signed)
Death Summary  Richard [name] AJO:[id number] DOB: 1955/10/09 DOA: Nov 04, 2022  PCP: [facility]  Admit date: 11/04/2022 Date of Death: Nov 13, 2022 Time of Death: 0800 Notification:   History of present illness:  67 year old African-American male with history of end-stage renal disease on hemodialysis, CVA with right-sided weakness, HIV on HAART, diabetes mellitus, seizures and hypertension.  Patient is a skilled nursing facility resident.  Patient was admitted with uremia, potassium of 7.5 BUN of 172 and creatinine of 22.5.  Patient has gangrenous foot.  Patient was deemed to have severe sepsis with septic shock.  Patient was managed by ICU team.  After discussion with patient's family, patient was made comfort directed care.  ICU team transferred patient to the care of hospitalist service. Started on comfort care. Passed away peacefully today   The results of significant diagnostics from this hospitalization (including imaging, microbiology, ancillary and laboratory) are listed below for reference.    Significant Diagnostic Studies: DG Abd Portable 1V  Result Date: 10/17/2022 CLINICAL DATA:  Feeding tube placement EXAM: PORTABLE ABDOMEN - 1 VIEW COMPARISON:  Radiograph October 16, 2022 FINDINGS: Enteric feeding catheter courses below the diaphragm with tip projecting over the stomach. Partially visualized left IJ CVC with tip projecting over the right atrium and dual lumen right neck CVC with tip projecting over the right atrium. Cardiac monitoring leads coiled over the chest. The bowel gas pattern is normal. IMPRESSION: Enteric feeding catheter courses below the diaphragm with tip projecting over the stomach. Electronically Signed   By: Dahlia Bailiff M.D.   On: 10/17/2022 10:40   DG Chest Port 1 View  Result Date: 10/17/2022 CLINICAL DATA:  [id number] Hypoxia [id number] EXAM: PORTABLE CHEST 1 VIEW COMPARISON:  October 16, 2022 FINDINGS: The cardiomediastinal silhouette is unchanged  in contour.RIGHT neck CVC tip terminates over the RIGHT atrium. LEFT IJ CVC tip terminates over the superior cavoatrial junction. Atherosclerotic calcifications of the aorta. No pleural effusion. No pneumothorax. No acute pleuroparenchymal abnormality. IMPRESSION: Support apparatus as described above. No acute cardiopulmonary abnormality. Electronically Signed   By: Valentino Saxon M.D.   On: 10/17/2022 08:58   MR FOOT LEFT WO CONTRAST  Result Date: 10/16/2022 CLINICAL DATA:  Left foot gangrene. EXAM: MRI OF THE LEFT FOOT WITHOUT CONTRAST TECHNIQUE: Multiplanar, multisequence MR imaging of the left foot was performed. No intravenous contrast was administered. COMPARISON:  Left foot x-rays from yesterday. FINDINGS: Technically limited study due to patient positioning and motion artifact. The distal toes are not well evaluated. Bones/Joint/Cartilage No definite marrow signal abnormality. No fracture or dislocation. Joint spaces are preserved. No joint effusion. Ligaments Collateral ligaments are intact. Lisfranc ligament is intact. Medial and lateral ankle ligaments are grossly intact. Muscles and Tendons Grossly intact. No tenosynovitis. Increased T2 signal within the intrinsic muscles of the forefoot, nonspecific, but likely related to diabetic muscle changes. Soft tissue No fluid collection or hematoma.  No soft tissue mass. IMPRESSION: 1. Technically limited study due to patient positioning and motion artifact. No definite evidence of osteomyelitis. Electronically Signed   By: Titus Dubin M.D.   On: 10/16/2022 17:05   DG Abd 1 View  Result Date: 10/16/2022 CLINICAL DATA:  MRI clearance EXAM: ABDOMEN - 1 VIEW COMPARISON:  09/27/2022 FINDINGS: EKG leads project over abdomen. Nonobstructive bowel gas pattern. Scattered atherosclerotic calcifications. Bones demineralized. No metallic foreign bodies identified. IMPRESSION: No orbital metallic foreign bodies. Electronically Signed   By: Lavonia Dana M.D.    On: 10/16/2022 12:10   DG CHEST PORT 1  VIEW  Result Date: 10/16/2022 CLINICAL DATA:  Central venous catheter in place. EXAM: PORTABLE CHEST 1 VIEW COMPARISON:  One-view chest x-ray 10/09/2022 FINDINGS: Right IJ dialysis catheter is stable. The tip is in the right atrium. A new left IJ line has been placed. The tip is at the cavoatrial junction. No pneumothorax is present. The heart size is normal. Lung volumes are low. No edema or effusion is present. No focal airspace disease is present. IMPRESSION: 1. Interval placement of left IJ line without radiographic evidence for complication. 2. Stable right IJ dialysis catheter. 3. Low lung volumes. Electronically Signed   By: San Morelle M.D.   On: 10/16/2022 09:56   DG Foot Complete Left  Result Date: 10/17/2022 CLINICAL DATA:  Wound EXAM: LEFT FOOT - COMPLETE 3 VIEW COMPARISON:  None Available. FINDINGS: Subtalar and talonavicular degenerative changes identified with sclerosis and osteophytes. No acute fracture, dislocation or subluxation identified. No osseous destructive process identified. No radiopaque foreign bodies are seen. IMPRESSION: Degenerative changes. No acute osseous abnormality and no bony destructive process identified. Electronically Signed   By: Sammie Bench M.D.   On: 10/20/2022 19:55   DG Chest 1 View  Result Date: 10/19/2022 CLINICAL DATA:  Wound infection screening EXAM: CHEST  1 VIEW COMPARISON:  09/30/2022 FINDINGS: Cardiac silhouette is unremarkable. No pneumothorax or pleural effusion. The lungs are clear. Aorta is calcified. The visualized skeletal structures are unremarkable. Hemodialysis catheter tip overlies RA/SVC. IMPRESSION: No acute cardiopulmonary process. Electronically Signed   By: Sammie Bench M.D.   On: 10/10/2022 19:51   CT Head Wo Contrast  Result Date: 09/22/2022 CLINICAL DATA:  Delirium EXAM: CT HEAD WITHOUT CONTRAST TECHNIQUE: Contiguous axial images were obtained from the base of the skull  through the vertex without intravenous contrast. RADIATION DOSE REDUCTION: This exam was performed according to the departmental dose-optimization program which includes automated exposure control, adjustment of the mA and/or kV according to patient size and/or use of iterative reconstruction technique. COMPARISON:  CT brain 09/27/2022 FINDINGS: Brain: No acute territorial infarction, hemorrhage or intracranial mass. Moderate atrophy. Mild chronic small vessel ischemic changes of the white matter. Stable ventricle size. Vascular: No hyperdense vessels.  Carotid vascular calcification Skull: Normal. Negative for fracture or focal lesion. Sinuses/Orbits: No acute finding. Old appearing deformity medial wall left orbit. Other: None IMPRESSION: 1. No CT evidence for acute intracranial abnormality. 2. Atrophy and chronic small vessel ischemic changes of the white matter. Electronically Signed   By: Donavan Foil M.D.   On: 10/07/2022 19:42   DG Chest 1 View  Result Date: 09/30/2022 CLINICAL DATA:  Shortness of breath. Per triage note, patient arrived via EMS for reportedly low O2 sats. Not in respiratory distress at this time. EXAM: CHEST  1 VIEW COMPARISON:  Chest radiograph 09/27/2022. FINDINGS: Unchanged cardiomegaly and mediastinal contours. The lungs are well aerated. Unchanged positioning of a right IJ approach tunneled central venous catheter with tip projecting over the right atrium. No pleural effusion or pneumothorax. Visualized skeleton is unremarkable. IMPRESSION: 1. No acute cardiopulmonary findings. 2. Unchanged cardiomegaly. Electronically Signed   By: Emmit Alexanders M.D   On: 09/30/2022 13:40   CT HEAD WO CONTRAST (5MM)  Result Date: 09/27/2022 CLINICAL DATA:  Fall EXAM: CT HEAD WITHOUT CONTRAST CT CERVICAL SPINE WITHOUT CONTRAST TECHNIQUE: Multidetector CT imaging of the head and cervical spine was performed following the standard protocol without intravenous contrast. Multiplanar CT image  reconstructions of the cervical spine were also generated. RADIATION DOSE REDUCTION: This exam  was performed according to the departmental dose-optimization program which includes automated exposure control, adjustment of the mA and/or kV according to patient size and/or use of iterative reconstruction technique. COMPARISON:  CT head 07/22/2022, CT head and cervical spine 12/19/2021 FINDINGS: Brain: No evidence of acute infarct, hemorrhage, mass, mass effect, or midline shift. No hydrocephalus or extra-axial fluid collection. Periventricular white matter changes, likely the sequela of chronic small vessel ischemic disease. Vascular: No hyperdense vessel. Atherosclerotic calcifications in the intracranial carotid and vertebral arteries. Skull: Normal. Negative for fracture or focal lesion. Sinuses/Orbits: No acute finding. Other: The mastoid air cells are well aerated. CT CERVICAL SPINE FINDINGS Alignment: No listhesis. Skull base and vertebrae: No acute fracture. No primary bone lesion or focal pathologic process. Soft tissues and spinal canal: No prevertebral fluid or swelling. No visible canal hematoma. Disc levels: Multilevel degenerative changes, with disc height loss most prominently at C3-C4. Mild spinal canal stenosis at C3-C4. Multilevel uncovertebral and facet arthropathy. Upper chest: Right greater than left apical scarring. No focal pulmonary opacity or pleural effusion. Other: Multiple calcifications and hypoenhancing nodules in the right thyroid lobe, with a 1.8 cm exophytic lesion extending from the right thyroid lobe (series 4, image 84). IMPRESSION: 1. No acute intracranial process. 2. No acute fracture or traumatic listhesis in the cervical spine. 3. Multiple calcifications and hypoenhancing nodules in the right thyroid lobe, with a 1.8 cm exophytic lesion extending from the right thyroid lobe. If this has not previously been evaluated, a non-emergent ultrasound of the thyroid is recommended.  (Reference: J Am Coll Radiol. 2015 Feb;12(2): 143-50) Electronically Signed   By: Merilyn Baba M.D.   On: 09/27/2022 03:24   CT Cervical Spine Wo Contrast  Result Date: 09/27/2022 CLINICAL DATA:  Fall EXAM: CT HEAD WITHOUT CONTRAST CT CERVICAL SPINE WITHOUT CONTRAST TECHNIQUE: Multidetector CT imaging of the head and cervical spine was performed following the standard protocol without intravenous contrast. Multiplanar CT image reconstructions of the cervical spine were also generated. RADIATION DOSE REDUCTION: This exam was performed according to the departmental dose-optimization program which includes automated exposure control, adjustment of the mA and/or kV according to patient size and/or use of iterative reconstruction technique. COMPARISON:  CT head 07/22/2022, CT head and cervical spine 12/19/2021 FINDINGS: Brain: No evidence of acute infarct, hemorrhage, mass, mass effect, or midline shift. No hydrocephalus or extra-axial fluid collection. Periventricular white matter changes, likely the sequela of chronic small vessel ischemic disease. Vascular: No hyperdense vessel. Atherosclerotic calcifications in the intracranial carotid and vertebral arteries. Skull: Normal. Negative for fracture or focal lesion. Sinuses/Orbits: No acute finding. Other: The mastoid air cells are well aerated. CT CERVICAL SPINE FINDINGS Alignment: No listhesis. Skull base and vertebrae: No acute fracture. No primary bone lesion or focal pathologic process. Soft tissues and spinal canal: No prevertebral fluid or swelling. No visible canal hematoma. Disc levels: Multilevel degenerative changes, with disc height loss most prominently at C3-C4. Mild spinal canal stenosis at C3-C4. Multilevel uncovertebral and facet arthropathy. Upper chest: Right greater than left apical scarring. No focal pulmonary opacity or pleural effusion. Other: Multiple calcifications and hypoenhancing nodules in the right thyroid lobe, with a 1.8 cm exophytic  lesion extending from the right thyroid lobe (series 4, image 84). IMPRESSION: 1. No acute intracranial process. 2. No acute fracture or traumatic listhesis in the cervical spine. 3. Multiple calcifications and hypoenhancing nodules in the right thyroid lobe, with a 1.8 cm exophytic lesion extending from the right thyroid lobe. If this has not previously  been evaluated, a non-emergent ultrasound of the thyroid is recommended. (Reference: J Am Coll Radiol. 2015 Feb;12(2): 143-50) Electronically Signed   By: Merilyn Baba M.D.   On: 09/27/2022 03:24   DG Knee Complete 4 Views Left  Result Date: 09/27/2022 CLINICAL DATA:  Fall, left knee pain EXAM: LEFT KNEE - COMPLETE 4+ VIEW COMPARISON:  None Available. FINDINGS: Normal alignment. No acute fracture or dislocation. No effusion. Advanced vascular calcifications noted. IMPRESSION: 1. No acute fracture or dislocation. Electronically Signed   By: Fidela Salisbury M.D.   On: 09/27/2022 03:04   DG Shoulder Right  Result Date: 09/27/2022 CLINICAL DATA:  Right shoulder pain after fall EXAM: RIGHT SHOULDER - 2+ VIEW COMPARISON:  08/08/2020 FINDINGS: No dislocation. No definite acute fracture. Superior subluxation of the humeral head in relation to the glenoid compatible with rotator cuff pathology. Degenerative arthritis glenohumeral and AC joints. Partially visualized right IJ CVC. IMPRESSION: No acute fracture or dislocation. Electronically Signed   By: Placido Sou M.D.   On: 09/27/2022 03:03   DG Pelvis Portable  Result Date: 09/27/2022 CLINICAL DATA:  Trauma EXAM: PORTABLE PELVIS 1-2 VIEWS COMPARISON:  11/04/2021 and CT 09/19/2022 FINDINGS: Redemonstrated internal rotation of the right hip which limits evaluation of the right femoral neck. Demineralization. No definite acute fracture. No dislocation. Degenerative changes pubic symphysis, both hips, SI joints and lower lumbar spine. IMPRESSION: No definite acute fracture. Electronically Signed   By: Placido Sou M.D.   On: 09/27/2022 02:47   DG Chest Port 1 View  Result Date: 09/27/2022 CLINICAL DATA:  Trauma, fall. EXAM: PORTABLE CHEST 1 VIEW COMPARISON:  08/31/2022. FINDINGS: The heart is enlarged and the mediastinal contour stable. There is atherosclerotic calcification of the aorta. A stable right internal jugular central venous catheter is noted. The lungs are clear without effusions or infiltrates. No pneumothorax. No acute osseous abnormality. IMPRESSION: 1. No active disease. 2. Cardiomegaly. Electronically Signed   By: Brett Fairy M.D.   On: 09/27/2022 02:47    Microbiology: Recent Results (from the past 240 hour(s))  Resp panel by RT-PCR (RSV, Flu A&B, Covid) Anterior Nasal Swab     Status: None   Collection Time: 09/27/2022  6:53 PM   Specimen: Anterior Nasal Swab  Result Value Ref Range Status   SARS Coronavirus 2 by RT PCR NEGATIVE NEGATIVE Final    Comment: (NOTE) SARS-CoV-2 target nucleic acids are NOT DETECTED.  The SARS-CoV-2 RNA is generally detectable in upper respiratory specimens during the acute phase of infection. The lowest concentration of SARS-CoV-2 viral copies this assay can detect is 138 copies/mL. A negative result does not preclude SARS-Cov-2 infection and should not be used as the sole basis for treatment or other patient management decisions. A negative result may occur with  improper specimen collection/handling, submission of specimen other than nasopharyngeal swab, presence of viral mutation(s) within the areas targeted by this assay, and inadequate number of viral copies(<138 copies/mL). A negative result must be combined with clinical observations, patient history, and epidemiological information. The expected result is Negative.  Fact Sheet for Patients:  EntrepreneurPulse.com.au  Fact Sheet for Healthcare Providers:  IncredibleEmployment.be  This test is no t yet approved or cleared by the [place] FDA  and  has been authorized for detection and/or diagnosis of SARS-CoV-2 by FDA under an Emergency Use Authorization (EUA). This EUA will remain  in effect (meaning this test can be used) for the duration of the COVID-19 declaration under Section 564(b)(1) of the Act, 21  U.S.C.section 360bbb-3(b)(1), unless the authorization is terminated  or revoked sooner.       Influenza A by PCR NEGATIVE NEGATIVE Final   Influenza B by PCR NEGATIVE NEGATIVE Final    Comment: (NOTE) The Xpert Xpress SARS-CoV-2/FLU/RSV plus assay is intended as an aid in the diagnosis of influenza from Nasopharyngeal swab specimens and should not be used as a sole basis for treatment. Nasal washings and aspirates are unacceptable for Xpert Xpress SARS-CoV-2/FLU/RSV testing.  Fact Sheet for Patients: EntrepreneurPulse.com.au  Fact Sheet for Healthcare Providers: IncredibleEmployment.be  This test is not yet approved or cleared by the [place] FDA and has been authorized for detection and/or diagnosis of SARS-CoV-2 by FDA under an Emergency Use Authorization (EUA). This EUA will remain in effect (meaning this test can be used) for the duration of the COVID-19 declaration under Section 564(b)(1) of the Act, 21 U.S.C. section 360bbb-3(b)(1), unless the authorization is terminated or revoked.     Resp Syncytial Virus by PCR NEGATIVE NEGATIVE Final    Comment: (NOTE) Fact Sheet for Patients: EntrepreneurPulse.com.au  Fact Sheet for Healthcare Providers: IncredibleEmployment.be  This test is not yet approved or cleared by the [place] FDA and has been authorized for detection and/or diagnosis of SARS-CoV-2 by FDA under an Emergency Use Authorization (EUA). This EUA will remain in effect (meaning this test can be used) for the duration of the COVID-19 declaration under Section 564(b)(1) of the Act, 21 U.S.C. section 360bbb-3(b)(1),  unless the authorization is terminated or revoked.  Performed at [facility], [place] [address], [place], [place] [address]   Blood culture (routine x 2)     Status: None   Collection Time: 10/16/22  7:05 AM   Specimen: BLOOD  Result Value Ref Range Status   Specimen Description BLOOD FEMORAL ARTERY  Final   Special Requests   Final    BOTTLES DRAWN AEROBIC ONLY Blood Culture adequate volume   Culture   Final    NO GROWTH 5 DAYS Performed at [facility], [place] [address], [place], [place] [address]    Report Status 10/21/2022 FINAL  Final  Blood culture (routine x 2)     Status: Abnormal   Collection Time: 10/16/22  7:10 AM   Specimen: BLOOD  Result Value Ref Range Status   Specimen Description BLOOD LEFT ANTECUBITAL  Final   Special Requests   Final    BOTTLES DRAWN AEROBIC AND ANAEROBIC Blood Culture adequate volume   Culture  Setup Time   Final    GRAM POSITIVE COCCI IN CLUSTERS ANAEROBIC BOTTLE ONLY CRITICAL RESULT CALLED TO, READ BACK BY AND VERIFIED WITH: PHARMD AUSTIN PAYTES [id number] BY JRS    Culture (A)  Final    STAPHYLOCOCCUS EPIDERMIDIS THE SIGNIFICANCE OF ISOLATING THIS ORGANISM FROM A SINGLE SET OF BLOOD CULTURES WHEN MULTIPLE SETS ARE DRAWN IS UNCERTAIN. PLEASE NOTIFY THE MICROBIOLOGY DEPARTMENT WITHIN ONE WEEK IF SPECIATION AND SENSITIVITIES ARE REQUIRED. Performed at [facility], [place] [address] [place], [place] [address]    Report Status 10/19/2022 FINAL  Final  Blood Culture ID Panel (Reflexed)     Status: Abnormal   Collection Time: 10/16/22  7:10 AM  Result Value Ref Range Status   Enterococcus faecalis NOT DETECTED NOT DETECTED Final   Enterococcus Faecium NOT DETECTED NOT DETECTED Final   Listeria monocytogenes NOT DETECTED NOT DETECTED Final   Staphylococcus species DETECTED (A) NOT DETECTED Final    Comment: CRITICAL RESULT CALLED TO, READ  BACK BY AND VERIFIED WITH: PHARMD AUTIN PAYTES [id number] BY JRS    Staphylococcus  aureus (BCID) NOT DETECTED NOT DETECTED Final   Staphylococcus epidermidis DETECTED (A) NOT DETECTED Final    Comment: Methicillin (oxacillin) resistant coagulase negative staphylococcus. Possible blood culture contaminant (unless isolated from more than one blood culture draw or clinical case suggests pathogenicity). No antibiotic treatment is indicated for blood  culture contaminants.    Staphylococcus lugdunensis NOT DETECTED NOT DETECTED Final   Streptococcus species NOT DETECTED NOT DETECTED Final   Streptococcus agalactiae NOT DETECTED NOT DETECTED Final   Streptococcus pneumoniae NOT DETECTED NOT DETECTED Final   Streptococcus pyogenes NOT DETECTED NOT DETECTED Final   A.calcoaceticus-baumannii NOT DETECTED NOT DETECTED Final   Bacteroides fragilis NOT DETECTED NOT DETECTED Final   Enterobacterales NOT DETECTED NOT DETECTED Final   Enterobacter cloacae complex NOT DETECTED NOT DETECTED Final   Escherichia coli NOT DETECTED NOT DETECTED Final   Klebsiella aerogenes NOT DETECTED NOT DETECTED Final   Klebsiella oxytoca NOT DETECTED NOT DETECTED Final   Klebsiella pneumoniae NOT DETECTED NOT DETECTED Final   Proteus species NOT DETECTED NOT DETECTED Final   Salmonella species NOT DETECTED NOT DETECTED Final   Serratia marcescens NOT DETECTED NOT DETECTED Final   Haemophilus influenzae NOT DETECTED NOT DETECTED Final   Neisseria meningitidis NOT DETECTED NOT DETECTED Final   Pseudomonas aeruginosa NOT DETECTED NOT DETECTED Final   Stenotrophomonas maltophilia NOT DETECTED NOT DETECTED Final   Candida albicans NOT DETECTED NOT DETECTED Final   Candida auris NOT DETECTED NOT DETECTED Final   Candida glabrata NOT DETECTED NOT DETECTED Final   Candida krusei NOT DETECTED NOT DETECTED Final   Candida parapsilosis NOT DETECTED NOT DETECTED Final   Candida tropicalis NOT DETECTED NOT DETECTED Final   Cryptococcus neoformans/gattii NOT DETECTED NOT DETECTED Final   Methicillin resistance  mecA/C DETECTED (A) NOT DETECTED Final    Comment: CRITICAL RESULT CALLED TO, READ BACK BY AND VERIFIED WITH: PHARMD AUSTIN PAYTES [id number] BY JRS Performed at [facility], [address] [place], [place] [address]   MRSA Next Gen by PCR, Nasal     Status: None   Collection Time: 10/16/22  1:30 PM   Specimen: Nasal Mucosa; Nasal Swab  Result Value Ref Range Status   MRSA by PCR Next Gen NOT DETECTED NOT DETECTED Final    Comment: (NOTE) The GeneXpert MRSA Assay (FDA approved for NASAL specimens only), is one component of a comprehensive MRSA colonization surveillance program. It is not intended to diagnose MRSA infection nor to guide or monitor treatment for MRSA infections. Test performance is not FDA approved in patients less than 49 years old. Performed at [facility], [place] [address] [place], [place] [address]      Labs: Basic Metabolic Panel: Recent Labs  Lab 10/17/22 1706 10/18/22 0326  NA  --  135  K  --  3.9  CL  --  95*  CO2  --  23  GLUCOSE  --  172*  BUN  --  58*  CREATININE  --  7.90*  CALCIUM  --  8.5*  MG 2.0 2.1  PHOS 2.4* 3.5   Liver Function Tests: Recent Labs  Lab 10/18/22 0326  ALBUMIN 1.9*   No results for input(s): "LIPASE", "AMYLASE" in the last 168 hours. No results for input(s): "AMMONIA" in the last 168 hours. CBC: Recent Labs  Lab 10/18/22 0326  WBC 16.2*  HGB 10.1*  HCT 30.5*  MCV 92.1  PLT 161   Cardiac Enzymes: No results for input(s): "CKTOTAL", "CKMB", "CKMBINDEX", "TROPONINI" in the last 168 hours. D-Dimer No results for input(s): "DDIMER" in the last 72 hours. BNP: Invalid input(s): "POCBNP" CBG: Recent Labs  Lab 10/17/22 1937 10/17/22 2322 10/18/22 0318 10/18/22 0727 10/18/22 1118  GLUCAP 80 98 169* 229* 100*   Anemia work up No results for input(s): "VITAMINB12", "FOLATE", "FERRITIN", "TIBC", "IRON", "RETICCTPCT" in the last 72 hours. Urinalysis    Component Value Date/Time   COLORURINE  AMBER (A) 10/18/2022 0430   APPEARANCEUR CLOUDY (A) 10/18/2022 0430   LABSPEC 1.015 10/18/2022 0430   PHURINE 5.0 10/18/2022 0430   GLUCOSEU NEGATIVE 10/18/2022 0430   HGBUR SMALL (A) 10/18/2022 0430   BILIRUBINUR NEGATIVE 10/18/2022 0430   KETONESUR NEGATIVE 10/18/2022 0430   PROTEINUR >=300 (A) 10/18/2022 0430   UROBILINOGEN 0.2 11/29/2014 2003   NITRITE NEGATIVE 10/18/2022 0430   LEUKOCYTESUR NEGATIVE 10/18/2022 0430   Sepsis Labs Recent Labs  Lab 10/18/22 0326  WBC 16.2*       SIGNED:  Shelly Coss, MD  [facility] 2022/10/28, 10:58 AM Pager [phone]  If 7PM-7AM, please contact night-coverage [email or web address] Password [email or web address]

## 2022-11-21 DEATH — deceased
